# Patient Record
Sex: Female | Born: 1955 | Race: Black or African American | Hispanic: No | Smoking: Former smoker
Health system: Southern US, Community
[De-identification: ages and names within clinical notes are randomized; demographics above are authoritative.]

## PROBLEM LIST (undated history)

## (undated) DIAGNOSIS — E1140 Type 2 diabetes mellitus with diabetic neuropathy, unspecified: Secondary | ICD-10-CM

## (undated) DIAGNOSIS — I2510 Atherosclerotic heart disease of native coronary artery without angina pectoris: Secondary | ICD-10-CM

## (undated) DIAGNOSIS — Z720 Tobacco use: Secondary | ICD-10-CM

## (undated) DIAGNOSIS — I739 Peripheral vascular disease, unspecified: Secondary | ICD-10-CM

## (undated) DIAGNOSIS — E785 Hyperlipidemia, unspecified: Secondary | ICD-10-CM

## (undated) DIAGNOSIS — F32A Depression, unspecified: Secondary | ICD-10-CM

## (undated) DIAGNOSIS — G629 Polyneuropathy, unspecified: Secondary | ICD-10-CM

## (undated) DIAGNOSIS — E669 Obesity, unspecified: Secondary | ICD-10-CM

## (undated) DIAGNOSIS — Z794 Long term (current) use of insulin: Secondary | ICD-10-CM

## (undated) DIAGNOSIS — E119 Type 2 diabetes mellitus without complications: Secondary | ICD-10-CM

## (undated) DIAGNOSIS — IMO0001 Reserved for inherently not codable concepts without codable children: Secondary | ICD-10-CM

## (undated) DIAGNOSIS — I4891 Unspecified atrial fibrillation: Secondary | ICD-10-CM

## (undated) DIAGNOSIS — I4892 Unspecified atrial flutter: Secondary | ICD-10-CM

## (undated) DIAGNOSIS — M869 Osteomyelitis, unspecified: Secondary | ICD-10-CM

## (undated) DIAGNOSIS — M1710 Unilateral primary osteoarthritis, unspecified knee: Secondary | ICD-10-CM

## (undated) DIAGNOSIS — M179 Osteoarthritis of knee, unspecified: Secondary | ICD-10-CM

## (undated) DIAGNOSIS — F419 Anxiety disorder, unspecified: Secondary | ICD-10-CM

## (undated) DIAGNOSIS — M1990 Unspecified osteoarthritis, unspecified site: Secondary | ICD-10-CM

## (undated) DIAGNOSIS — I10 Essential (primary) hypertension: Secondary | ICD-10-CM

## (undated) DIAGNOSIS — K219 Gastro-esophageal reflux disease without esophagitis: Secondary | ICD-10-CM

## (undated) DIAGNOSIS — I255 Ischemic cardiomyopathy: Secondary | ICD-10-CM

## (undated) HISTORY — DX: Reserved for inherently not codable concepts without codable children: IMO0001

## (undated) HISTORY — DX: Ischemic cardiomyopathy: I25.5

## (undated) HISTORY — DX: Unspecified osteoarthritis, unspecified site: M19.90

## (undated) HISTORY — PX: BREAST BIOPSY: SHX20

## (undated) HISTORY — DX: Long term (current) use of insulin: Z79.4

## (undated) HISTORY — DX: Type 2 diabetes mellitus with diabetic neuropathy, unspecified: E11.40

## (undated) HISTORY — DX: Type 2 diabetes mellitus without complications: E11.9

## (undated) HISTORY — DX: Hyperlipidemia, unspecified: E78.5

## (undated) HISTORY — PX: AORTIC VALVE REPLACEMENT (AVR)/CORONARY ARTERY BYPASS GRAFTING (CABG): SHX5725

## (undated) HISTORY — DX: Peripheral vascular disease, unspecified: I73.9

## (undated) SURGERY — Surgical Case
Anesthesia: *Unknown

---

## 2004-08-21 ENCOUNTER — Emergency Department: Payer: Self-pay | Admitting: Internal Medicine

## 2004-10-05 ENCOUNTER — Emergency Department: Payer: Self-pay | Admitting: Emergency Medicine

## 2004-10-06 ENCOUNTER — Ambulatory Visit: Payer: Self-pay | Admitting: Emergency Medicine

## 2005-10-08 ENCOUNTER — Emergency Department: Payer: Self-pay | Admitting: Emergency Medicine

## 2006-11-15 ENCOUNTER — Emergency Department: Payer: Self-pay | Admitting: Emergency Medicine

## 2007-03-11 ENCOUNTER — Emergency Department: Payer: Self-pay | Admitting: Emergency Medicine

## 2009-08-06 ENCOUNTER — Emergency Department: Payer: Self-pay | Admitting: Emergency Medicine

## 2010-11-30 ENCOUNTER — Emergency Department: Payer: Self-pay | Admitting: Emergency Medicine

## 2011-12-31 HISTORY — PX: TOE AMPUTATION: SHX809

## 2012-01-19 ENCOUNTER — Inpatient Hospital Stay: Payer: Self-pay | Admitting: Internal Medicine

## 2012-01-19 LAB — COMPREHENSIVE METABOLIC PANEL
Albumin: 3.5 g/dL (ref 3.4–5.0)
Alkaline Phosphatase: 160 U/L — ABNORMAL HIGH (ref 50–136)
Anion Gap: 12 (ref 7–16)
BUN: 10 mg/dL (ref 7–18)
Bilirubin,Total: 0.2 mg/dL (ref 0.2–1.0)
Calcium, Total: 9.5 mg/dL (ref 8.5–10.1)
Chloride: 100 mmol/L (ref 98–107)
Co2: 24 mmol/L (ref 21–32)
Creatinine: 0.87 mg/dL (ref 0.60–1.30)
EGFR (African American): >60
EGFR (Non-African Amer.): >60
Glucose: 339 mg/dL — ABNORMAL HIGH (ref 65–99)
Osmolality: 284 (ref 275–301)
Potassium: 4.2 mmol/L (ref 3.5–5.1)
SGOT(AST): 11 U/L — ABNORMAL LOW (ref 15–37)
SGPT (ALT): 14 U/L (ref 12–78)
Sodium: 136 mmol/L (ref 136–145)
Total Protein: 8.3 g/dL — ABNORMAL HIGH (ref 6.4–8.2)

## 2012-01-19 LAB — CBC
HCT: 45.2 % (ref 35.0–47.0)
HGB: 14.7 g/dL (ref 12.0–16.0)
MCH: 24.8 pg — ABNORMAL LOW (ref 26.0–34.0)
MCHC: 32.4 g/dL (ref 32.0–36.0)
MCV: 77 fL — ABNORMAL LOW (ref 80–100)
Platelet: 307 10*3/uL (ref 150–440)
RBC: 5.91 10*6/uL — ABNORMAL HIGH (ref 3.80–5.20)
RDW: 15.1 % — ABNORMAL HIGH (ref 11.5–14.5)
WBC: 12 10*3/uL — ABNORMAL HIGH (ref 3.6–11.0)

## 2012-01-19 LAB — SEDIMENTATION RATE: Erythrocyte Sed Rate: 21 mm/hr (ref 0–30)

## 2012-01-19 LAB — HEMOGLOBIN A1C: Hemoglobin A1C: 13.5 % — ABNORMAL HIGH (ref 4.2–6.3)

## 2012-01-20 LAB — CBC WITH DIFFERENTIAL/PLATELET
Basophil #: 0.1 10*3/uL (ref 0.0–0.1)
Basophil %: 0.8 %
Eosinophil #: 0.5 10*3/uL (ref 0.0–0.7)
Eosinophil %: 5.7 %
HCT: 40.8 % (ref 35.0–47.0)
HGB: 13.2 g/dL (ref 12.0–16.0)
Lymphocyte #: 3.1 10*3/uL (ref 1.0–3.6)
Lymphocyte %: 36.1 %
MCH: 24.7 pg — ABNORMAL LOW (ref 26.0–34.0)
MCHC: 32.4 g/dL (ref 32.0–36.0)
MCV: 76 fL — ABNORMAL LOW (ref 80–100)
Monocyte #: 0.8 x10 3/mm (ref 0.2–0.9)
Monocyte %: 9.3 %
Neutrophil #: 4.1 10*3/uL (ref 1.4–6.5)
Neutrophil %: 48.1 %
Platelet: 333 10*3/uL (ref 150–440)
RBC: 5.35 10*6/uL — ABNORMAL HIGH (ref 3.80–5.20)
RDW: 15.2 % — ABNORMAL HIGH (ref 11.5–14.5)
WBC: 8.5 10*3/uL (ref 3.6–11.0)

## 2012-01-21 LAB — CBC WITH DIFFERENTIAL/PLATELET
Basophil #: 0.1 10*3/uL (ref 0.0–0.1)
Basophil %: 0.7 %
Eosinophil #: 0.5 10*3/uL (ref 0.0–0.7)
Eosinophil %: 5.6 %
HCT: 41.3 % (ref 35.0–47.0)
HGB: 13.4 g/dL (ref 12.0–16.0)
Lymphocyte #: 2.2 10*3/uL (ref 1.0–3.6)
Lymphocyte %: 26.3 %
MCH: 25 pg — ABNORMAL LOW (ref 26.0–34.0)
MCHC: 32.5 g/dL (ref 32.0–36.0)
MCV: 77 fL — ABNORMAL LOW (ref 80–100)
Monocyte #: 1 x10 3/mm — ABNORMAL HIGH (ref 0.2–0.9)
Monocyte %: 11.4 %
Neutrophil #: 4.7 10*3/uL (ref 1.4–6.5)
Neutrophil %: 56 %
Platelet: 329 10*3/uL (ref 150–440)
RBC: 5.38 10*6/uL — ABNORMAL HIGH (ref 3.80–5.20)
RDW: 15 % — ABNORMAL HIGH (ref 11.5–14.5)
WBC: 8.4 10*3/uL (ref 3.6–11.0)

## 2012-01-22 LAB — VANCOMYCIN, TROUGH: Vancomycin, Trough: 9 ug/mL — ABNORMAL LOW (ref 10–20)

## 2012-01-23 LAB — PATHOLOGY REPORT

## 2012-01-26 LAB — WOUND CULTURE

## 2012-02-02 ENCOUNTER — Inpatient Hospital Stay: Payer: Self-pay | Admitting: Podiatry

## 2012-02-02 LAB — CBC WITH DIFFERENTIAL/PLATELET
Basophil #: 0.1 10*3/uL (ref 0.0–0.1)
Basophil %: 1.2 %
Eosinophil #: 0.4 10*3/uL (ref 0.0–0.7)
Eosinophil %: 4.6 %
HCT: 41.9 % (ref 35.0–47.0)
HGB: 13.4 g/dL (ref 12.0–16.0)
Lymphocyte #: 2.9 10*3/uL (ref 1.0–3.6)
Lymphocyte %: 32.9 %
MCH: 24.5 pg — ABNORMAL LOW (ref 26.0–34.0)
MCHC: 32.1 g/dL (ref 32.0–36.0)
MCV: 76 fL — ABNORMAL LOW (ref 80–100)
Monocyte #: 0.8 x10 3/mm (ref 0.2–0.9)
Monocyte %: 9.2 %
Neutrophil #: 4.6 10*3/uL (ref 1.4–6.5)
Neutrophil %: 52.1 %
Platelet: 393 10*3/uL (ref 150–440)
RBC: 5.5 10*6/uL — ABNORMAL HIGH (ref 3.80–5.20)
RDW: 14.9 % — ABNORMAL HIGH (ref 11.5–14.5)
WBC: 8.7 10*3/uL (ref 3.6–11.0)

## 2012-02-02 LAB — BASIC METABOLIC PANEL
Anion Gap: 7 (ref 7–16)
BUN: 11 mg/dL (ref 7–18)
Calcium, Total: 9.1 mg/dL (ref 8.5–10.1)
Chloride: 106 mmol/L (ref 98–107)
Co2: 28 mmol/L (ref 21–32)
Creatinine: 1 mg/dL (ref 0.60–1.30)
EGFR (African American): >60
EGFR (Non-African Amer.): >60
Glucose: 166 mg/dL — ABNORMAL HIGH (ref 65–99)
Osmolality: 284 (ref 275–301)
Potassium: 4.3 mmol/L (ref 3.5–5.1)
Sodium: 141 mmol/L (ref 136–145)

## 2012-02-04 LAB — CBC WITH DIFFERENTIAL/PLATELET
Basophil #: 0.1 10*3/uL (ref 0.0–0.1)
Basophil %: 0.8 %
Eosinophil #: 0.4 10*3/uL (ref 0.0–0.7)
Eosinophil %: 5.6 %
HCT: 36.2 % (ref 35.0–47.0)
HGB: 12.4 g/dL (ref 12.0–16.0)
Lymphocyte #: 2.9 10*3/uL (ref 1.0–3.6)
Lymphocyte %: 37.2 %
MCH: 26.3 pg (ref 26.0–34.0)
MCHC: 34.3 g/dL (ref 32.0–36.0)
MCV: 77 fL — ABNORMAL LOW (ref 80–100)
Monocyte #: 0.8 x10 3/mm (ref 0.2–0.9)
Monocyte %: 10 %
Neutrophil #: 3.6 10*3/uL (ref 1.4–6.5)
Neutrophil %: 46.4 %
Platelet: 324 10*3/uL (ref 150–440)
RBC: 4.72 10*6/uL (ref 3.80–5.20)
RDW: 15.1 % — ABNORMAL HIGH (ref 11.5–14.5)
WBC: 7.8 10*3/uL (ref 3.6–11.0)

## 2012-02-07 LAB — WOUND CULTURE

## 2012-02-07 LAB — PATHOLOGY REPORT

## 2012-08-18 ENCOUNTER — Emergency Department: Payer: Self-pay | Admitting: Internal Medicine

## 2012-08-18 LAB — COMPREHENSIVE METABOLIC PANEL
Albumin: 3.4 g/dL (ref 3.4–5.0)
Alkaline Phosphatase: 146 U/L — ABNORMAL HIGH (ref 50–136)
Anion Gap: 3 — ABNORMAL LOW (ref 7–16)
BUN: 14 mg/dL (ref 7–18)
Bilirubin,Total: 0.2 mg/dL (ref 0.2–1.0)
Calcium, Total: 8.7 mg/dL (ref 8.5–10.1)
Chloride: 108 mmol/L — ABNORMAL HIGH (ref 98–107)
Co2: 30 mmol/L (ref 21–32)
Creatinine: 1.02 mg/dL (ref 0.60–1.30)
Glucose: 144 mg/dL — ABNORMAL HIGH (ref 65–99)
Osmolality: 284 (ref 275–301)
Potassium: 4 mmol/L (ref 3.5–5.1)
SGOT(AST): 15 U/L (ref 15–37)
SGPT (ALT): 18 U/L (ref 12–78)
Sodium: 141 mmol/L (ref 136–145)
Total Protein: 7.6 g/dL (ref 6.4–8.2)

## 2012-08-18 LAB — URINALYSIS, COMPLETE
Bacteria: NONE SEEN
Bilirubin,UR: NEGATIVE
Blood: NEGATIVE
Glucose,UR: 50 mg/dL (ref 0–75)
Ketone: NEGATIVE
Leukocyte Esterase: NEGATIVE
Nitrite: NEGATIVE
Ph: 5 (ref 4.5–8.0)
Protein: NEGATIVE
RBC,UR: <1 /HPF (ref 0–5)
Specific Gravity: 1.014 (ref 1.003–1.030)
Squamous Epithelial: 1
WBC UR: 1 /HPF (ref 0–5)

## 2012-08-18 LAB — CBC
HCT: 41.7 % (ref 35.0–47.0)
HGB: 13.3 g/dL (ref 12.0–16.0)
MCH: 24.8 pg — ABNORMAL LOW (ref 26.0–34.0)
MCHC: 32 g/dL (ref 32.0–36.0)
MCV: 78 fL — ABNORMAL LOW (ref 80–100)
Platelet: 326 10*3/uL (ref 150–440)
RBC: 5.37 10*6/uL — ABNORMAL HIGH (ref 3.80–5.20)
RDW: 15.4 % — ABNORMAL HIGH (ref 11.5–14.5)
WBC: 7.6 10*3/uL (ref 3.6–11.0)

## 2012-08-18 LAB — TROPONIN I: Troponin-I: <0.02 ng/mL

## 2012-09-11 DIAGNOSIS — E119 Type 2 diabetes mellitus without complications: Secondary | ICD-10-CM | POA: Insufficient documentation

## 2012-09-11 DIAGNOSIS — I10 Essential (primary) hypertension: Secondary | ICD-10-CM | POA: Diagnosis present

## 2012-12-03 ENCOUNTER — Ambulatory Visit: Payer: Self-pay

## 2013-11-28 ENCOUNTER — Emergency Department: Payer: Self-pay | Admitting: Emergency Medicine

## 2013-11-28 LAB — COMPREHENSIVE METABOLIC PANEL
Albumin: 2.9 g/dL — ABNORMAL LOW (ref 3.4–5.0)
Alkaline Phosphatase: 138 U/L — ABNORMAL HIGH
Anion Gap: 6 — ABNORMAL LOW (ref 7–16)
BUN: 12 mg/dL (ref 7–18)
Bilirubin,Total: 0.2 mg/dL (ref 0.2–1.0)
Calcium, Total: 8.7 mg/dL (ref 8.5–10.1)
Chloride: 108 mmol/L — ABNORMAL HIGH (ref 98–107)
Co2: 28 mmol/L (ref 21–32)
Creatinine: 1.11 mg/dL (ref 0.60–1.30)
EGFR (African American): >60
EGFR (Non-African Amer.): 54 — ABNORMAL LOW
Glucose: 324 mg/dL — ABNORMAL HIGH (ref 65–99)
Osmolality: 295 (ref 275–301)
Potassium: 4.2 mmol/L (ref 3.5–5.1)
SGOT(AST): 12 U/L — ABNORMAL LOW (ref 15–37)
SGPT (ALT): 19 U/L
Sodium: 142 mmol/L (ref 136–145)
Total Protein: 7.1 g/dL (ref 6.4–8.2)

## 2013-11-28 LAB — CBC WITH DIFFERENTIAL/PLATELET
Basophil #: 0.1 10*3/uL (ref 0.0–0.1)
Basophil %: 0.7 %
Eosinophil #: 0.4 10*3/uL (ref 0.0–0.7)
Eosinophil %: 4.8 %
HCT: 39.7 % (ref 35.0–47.0)
HGB: 12.7 g/dL (ref 12.0–16.0)
Lymphocyte #: 2.5 10*3/uL (ref 1.0–3.6)
Lymphocyte %: 29.2 %
MCH: 24.8 pg — ABNORMAL LOW (ref 26.0–34.0)
MCHC: 32.1 g/dL (ref 32.0–36.0)
MCV: 77 fL — ABNORMAL LOW (ref 80–100)
Monocyte #: 0.9 x10 3/mm (ref 0.2–0.9)
Monocyte %: 10.5 %
Neutrophil #: 4.8 10*3/uL (ref 1.4–6.5)
Neutrophil %: 54.8 %
Platelet: 278 10*3/uL (ref 150–440)
RBC: 5.14 10*6/uL (ref 3.80–5.20)
RDW: 16.3 % — ABNORMAL HIGH (ref 11.5–14.5)
WBC: 8.7 10*3/uL (ref 3.6–11.0)

## 2014-05-01 DIAGNOSIS — I219 Acute myocardial infarction, unspecified: Secondary | ICD-10-CM

## 2014-05-01 HISTORY — DX: Acute myocardial infarction, unspecified: I21.9

## 2014-06-01 HISTORY — PX: CARDIAC CATHETERIZATION: SHX172

## 2014-06-01 HISTORY — PX: US ECHOCARDIOGRAPHY: HXRAD669

## 2014-06-01 HISTORY — PX: CT ABD W & PELVIS WO CM: HXRAD294

## 2014-06-01 LAB — COMPREHENSIVE METABOLIC PANEL
Albumin: 3.2 g/dL — ABNORMAL LOW (ref 3.4–5.0)
Alkaline Phosphatase: 103 U/L (ref 46–116)
Anion Gap: 8 (ref 7–16)
BUN: 17 mg/dL (ref 7–18)
Bilirubin,Total: 0.4 mg/dL (ref 0.2–1.0)
Calcium, Total: 9.1 mg/dL (ref 8.5–10.1)
Chloride: 102 mmol/L (ref 98–107)
Co2: 27 mmol/L (ref 21–32)
Creatinine: 1.42 mg/dL — ABNORMAL HIGH (ref 0.60–1.30)
EGFR (African American): 49 — ABNORMAL LOW
EGFR (Non-African Amer.): 40 — ABNORMAL LOW
Glucose: 205 mg/dL — ABNORMAL HIGH (ref 65–99)
Osmolality: 281 (ref 275–301)
Potassium: 3.7 mmol/L (ref 3.5–5.1)
SGOT(AST): 14 U/L — ABNORMAL LOW (ref 15–37)
SGPT (ALT): 14 U/L (ref 14–63)
Sodium: 137 mmol/L (ref 136–145)
Total Protein: 7.1 g/dL (ref 6.4–8.2)

## 2014-06-01 LAB — CBC WITH DIFFERENTIAL/PLATELET
Basophil #: 0 10*3/uL (ref 0.0–0.1)
Basophil %: 0.2 %
Eosinophil #: 0.2 10*3/uL (ref 0.0–0.7)
Eosinophil %: 1.4 %
HCT: 41.1 % (ref 35.0–47.0)
HGB: 12.8 g/dL (ref 12.0–16.0)
Lymphocyte #: 1.1 10*3/uL (ref 1.0–3.6)
Lymphocyte %: 7.5 %
MCH: 24.1 pg — ABNORMAL LOW (ref 26.0–34.0)
MCHC: 31.1 g/dL — ABNORMAL LOW (ref 32.0–36.0)
MCV: 78 fL — ABNORMAL LOW (ref 80–100)
Monocyte #: 0.1 x10 3/mm — ABNORMAL LOW (ref 0.2–0.9)
Monocyte %: 0.9 %
Neutrophil #: 13.2 10*3/uL — ABNORMAL HIGH (ref 1.4–6.5)
Neutrophil %: 90 %
Platelet: 317 10*3/uL (ref 150–440)
RBC: 5.29 10*6/uL — ABNORMAL HIGH (ref 3.80–5.20)
RDW: 15.3 % — ABNORMAL HIGH (ref 11.5–14.5)
WBC: 14.7 10*3/uL — ABNORMAL HIGH (ref 3.6–11.0)

## 2014-06-01 LAB — URINALYSIS, COMPLETE
Bacteria: NONE SEEN
Bilirubin,UR: NEGATIVE
Glucose,UR: 50 mg/dL (ref 0–75)
Ketone: NEGATIVE
Leukocyte Esterase: NEGATIVE
Nitrite: NEGATIVE
Ph: 5 (ref 4.5–8.0)
Protein: 30
RBC,UR: 2 /HPF (ref 0–5)
Specific Gravity: 1.031 (ref 1.003–1.030)
Squamous Epithelial: 4
WBC UR: 7 /HPF (ref 0–5)

## 2014-06-01 LAB — LIPASE, BLOOD: Lipase: 70 U/L — ABNORMAL LOW (ref 73–393)

## 2014-06-02 ENCOUNTER — Other Ambulatory Visit: Payer: Self-pay | Admitting: Physician Assistant

## 2014-06-02 ENCOUNTER — Inpatient Hospital Stay: Payer: Self-pay | Admitting: Internal Medicine

## 2014-06-02 DIAGNOSIS — I340 Nonrheumatic mitral (valve) insufficiency: Secondary | ICD-10-CM | POA: Diagnosis not present

## 2014-06-02 DIAGNOSIS — I214 Non-ST elevation (NSTEMI) myocardial infarction: Secondary | ICD-10-CM

## 2014-06-02 DIAGNOSIS — R109 Unspecified abdominal pain: Secondary | ICD-10-CM

## 2014-06-02 DIAGNOSIS — R7989 Other specified abnormal findings of blood chemistry: Secondary | ICD-10-CM | POA: Diagnosis not present

## 2014-06-02 LAB — TROPONIN I
Troponin-I: 1.3 ng/mL — ABNORMAL HIGH
Troponin-I: 13.26 ng/mL — ABNORMAL HIGH
Troponin-I: 23 ng/mL — ABNORMAL HIGH
Troponin-I: 3.6 ng/mL — ABNORMAL HIGH
Troponin-I: 7.8 ng/mL — ABNORMAL HIGH
Troponin-I: <0.02 ng/mL

## 2014-06-02 LAB — CBC WITH DIFFERENTIAL/PLATELET
Basophil #: 0 10*3/uL (ref 0.0–0.1)
Basophil %: 0.2 %
Eosinophil #: 0 10*3/uL (ref 0.0–0.7)
Eosinophil %: 0.2 %
HCT: 35.4 % (ref 35.0–47.0)
HGB: 11.2 g/dL — ABNORMAL LOW (ref 12.0–16.0)
Lymphocyte #: 0.9 10*3/uL — ABNORMAL LOW (ref 1.0–3.6)
Lymphocyte %: 4.4 %
MCH: 24.5 pg — ABNORMAL LOW (ref 26.0–34.0)
MCHC: 31.6 g/dL — ABNORMAL LOW (ref 32.0–36.0)
MCV: 78 fL — ABNORMAL LOW (ref 80–100)
Monocyte #: 0.8 x10 3/mm (ref 0.2–0.9)
Monocyte %: 3.8 %
Neutrophil #: 18.1 10*3/uL — ABNORMAL HIGH (ref 1.4–6.5)
Neutrophil %: 91.4 %
Platelet: 267 10*3/uL (ref 150–440)
RBC: 4.56 10*6/uL (ref 3.80–5.20)
RDW: 15.3 % — ABNORMAL HIGH (ref 11.5–14.5)
WBC: 19.8 10*3/uL — ABNORMAL HIGH (ref 3.6–11.0)

## 2014-06-02 LAB — BASIC METABOLIC PANEL
Anion Gap: 8 (ref 7–16)
BUN: 19 mg/dL — ABNORMAL HIGH (ref 7–18)
Calcium, Total: 8.2 mg/dL — ABNORMAL LOW (ref 8.5–10.1)
Chloride: 107 mmol/L (ref 98–107)
Co2: 24 mmol/L (ref 21–32)
Creatinine: 1.53 mg/dL — ABNORMAL HIGH (ref 0.60–1.30)
EGFR (African American): 45 — ABNORMAL LOW
EGFR (Non-African Amer.): 37 — ABNORMAL LOW
Glucose: 212 mg/dL — ABNORMAL HIGH (ref 65–99)
Osmolality: 286 (ref 275–301)
Potassium: 3.9 mmol/L (ref 3.5–5.1)
Sodium: 139 mmol/L (ref 136–145)

## 2014-06-02 LAB — LIPID PANEL
Cholesterol: 94 mg/dL (ref 0–200)
HDL Cholesterol: 39 mg/dL — ABNORMAL LOW (ref 40–60)
Ldl Cholesterol, Calc: 31 mg/dL (ref 0–100)
Triglycerides: 122 mg/dL (ref 0–200)
VLDL Cholesterol, Calc: 24 mg/dL (ref 5–40)

## 2014-06-02 LAB — HEPARIN LEVEL (UNFRACTIONATED)
Anti-Xa(Unfractionated): 0.18 IU/mL — ABNORMAL LOW (ref 0.30–0.70)
Anti-Xa(Unfractionated): 0.25 IU/mL — ABNORMAL LOW (ref 0.30–0.70)

## 2014-06-02 LAB — APTT: Activated PTT: 25.6 secs (ref 23.6–35.9)

## 2014-06-02 LAB — HEMOGLOBIN A1C: Hemoglobin A1C: 11.5 % — ABNORMAL HIGH (ref 4.2–6.3)

## 2014-06-02 LAB — CK-MB
CK-MB: 10.7 ng/mL — ABNORMAL HIGH (ref 0.5–3.6)
CK-MB: 0.5 ng/mL (ref 0.5–3.6)
CK-MB: 20.5 ng/mL — ABNORMAL HIGH (ref 0.5–3.6)

## 2014-06-02 LAB — PROTIME-INR
INR: 1.1
Prothrombin Time: 14 secs

## 2014-06-03 ENCOUNTER — Inpatient Hospital Stay (HOSPITAL_COMMUNITY)
Admission: AD | Admit: 2014-06-03 | Discharge: 2014-06-13 | DRG: 236 | Disposition: A | Discharge status: Home / Self Care | Payer: Medicaid Other | Source: Other Acute Inpatient Hospital | Attending: Cardiothoracic Surgery | Admitting: Cardiothoracic Surgery

## 2014-06-03 ENCOUNTER — Encounter (HOSPITAL_COMMUNITY): Payer: Self-pay | Admitting: Physician Assistant

## 2014-06-03 DIAGNOSIS — E1129 Type 2 diabetes mellitus with other diabetic kidney complication: Secondary | ICD-10-CM

## 2014-06-03 DIAGNOSIS — D62 Acute posthemorrhagic anemia: Secondary | ICD-10-CM | POA: Diagnosis not present

## 2014-06-03 DIAGNOSIS — I214 Non-ST elevation (NSTEMI) myocardial infarction: Principal | ICD-10-CM | POA: Diagnosis present

## 2014-06-03 DIAGNOSIS — Z791 Long term (current) use of non-steroidal anti-inflammatories (NSAID): Secondary | ICD-10-CM

## 2014-06-03 DIAGNOSIS — D638 Anemia in other chronic diseases classified elsewhere: Secondary | ICD-10-CM | POA: Diagnosis present

## 2014-06-03 DIAGNOSIS — Z951 Presence of aortocoronary bypass graft: Secondary | ICD-10-CM

## 2014-06-03 DIAGNOSIS — F17210 Nicotine dependence, cigarettes, uncomplicated: Secondary | ICD-10-CM | POA: Diagnosis present

## 2014-06-03 DIAGNOSIS — Z794 Long term (current) use of insulin: Secondary | ICD-10-CM | POA: Diagnosis not present

## 2014-06-03 DIAGNOSIS — Z79899 Other long term (current) drug therapy: Secondary | ICD-10-CM | POA: Diagnosis not present

## 2014-06-03 DIAGNOSIS — F419 Anxiety disorder, unspecified: Secondary | ICD-10-CM | POA: Diagnosis present

## 2014-06-03 DIAGNOSIS — K5730 Diverticulosis of large intestine without perforation or abscess without bleeding: Secondary | ICD-10-CM | POA: Diagnosis present

## 2014-06-03 DIAGNOSIS — IMO0002 Reserved for concepts with insufficient information to code with codable children: Secondary | ICD-10-CM

## 2014-06-03 DIAGNOSIS — E118 Type 2 diabetes mellitus with unspecified complications: Secondary | ICD-10-CM | POA: Diagnosis not present

## 2014-06-03 DIAGNOSIS — K219 Gastro-esophageal reflux disease without esophagitis: Secondary | ICD-10-CM | POA: Diagnosis present

## 2014-06-03 DIAGNOSIS — E1165 Type 2 diabetes mellitus with hyperglycemia: Secondary | ICD-10-CM | POA: Diagnosis present

## 2014-06-03 DIAGNOSIS — Z6838 Body mass index (BMI) 38.0-38.9, adult: Secondary | ICD-10-CM | POA: Diagnosis not present

## 2014-06-03 DIAGNOSIS — G629 Polyneuropathy, unspecified: Secondary | ICD-10-CM | POA: Diagnosis present

## 2014-06-03 DIAGNOSIS — E8770 Fluid overload, unspecified: Secondary | ICD-10-CM | POA: Diagnosis not present

## 2014-06-03 DIAGNOSIS — E1322 Other specified diabetes mellitus with diabetic chronic kidney disease: Secondary | ICD-10-CM

## 2014-06-03 DIAGNOSIS — Z01810 Encounter for preprocedural cardiovascular examination: Secondary | ICD-10-CM | POA: Diagnosis not present

## 2014-06-03 DIAGNOSIS — I2510 Atherosclerotic heart disease of native coronary artery without angina pectoris: Secondary | ICD-10-CM | POA: Diagnosis not present

## 2014-06-03 DIAGNOSIS — I219 Acute myocardial infarction, unspecified: Secondary | ICD-10-CM

## 2014-06-03 DIAGNOSIS — Z8249 Family history of ischemic heart disease and other diseases of the circulatory system: Secondary | ICD-10-CM

## 2014-06-03 DIAGNOSIS — I517 Cardiomegaly: Secondary | ICD-10-CM | POA: Diagnosis not present

## 2014-06-03 DIAGNOSIS — E669 Obesity, unspecified: Secondary | ICD-10-CM | POA: Diagnosis present

## 2014-06-03 DIAGNOSIS — I25110 Atherosclerotic heart disease of native coronary artery with unstable angina pectoris: Secondary | ICD-10-CM | POA: Diagnosis not present

## 2014-06-03 DIAGNOSIS — J329 Chronic sinusitis, unspecified: Secondary | ICD-10-CM | POA: Diagnosis present

## 2014-06-03 DIAGNOSIS — I10 Essential (primary) hypertension: Secondary | ICD-10-CM | POA: Diagnosis present

## 2014-06-03 DIAGNOSIS — E119 Type 2 diabetes mellitus without complications: Secondary | ICD-10-CM | POA: Diagnosis not present

## 2014-06-03 DIAGNOSIS — K5900 Constipation, unspecified: Secondary | ICD-10-CM | POA: Diagnosis present

## 2014-06-03 DIAGNOSIS — I739 Peripheral vascular disease, unspecified: Secondary | ICD-10-CM | POA: Diagnosis present

## 2014-06-03 HISTORY — DX: Atherosclerotic heart disease of native coronary artery without angina pectoris: I25.10

## 2014-06-03 HISTORY — DX: Unilateral primary osteoarthritis, unspecified knee: M17.10

## 2014-06-03 HISTORY — DX: Essential (primary) hypertension: I10

## 2014-06-03 HISTORY — DX: Obesity, unspecified: E66.9

## 2014-06-03 HISTORY — DX: Anxiety disorder, unspecified: F41.9

## 2014-06-03 HISTORY — DX: Gastro-esophageal reflux disease without esophagitis: K21.9

## 2014-06-03 HISTORY — DX: Osteoarthritis of knee, unspecified: M17.9

## 2014-06-03 HISTORY — DX: Polyneuropathy, unspecified: G62.9

## 2014-06-03 HISTORY — DX: Tobacco use: Z72.0

## 2014-06-03 LAB — BASIC METABOLIC PANEL
Anion Gap: 7 (ref 7–16)
BUN: 13 mg/dL (ref 7–18)
Calcium, Total: 8.1 mg/dL — ABNORMAL LOW (ref 8.5–10.1)
Chloride: 107 mmol/L (ref 98–107)
Co2: 26 mmol/L (ref 21–32)
Creatinine: 1.13 mg/dL (ref 0.60–1.30)
EGFR (African American): >60
EGFR (Non-African Amer.): 52 — ABNORMAL LOW
Glucose: 227 mg/dL — ABNORMAL HIGH (ref 65–99)
Osmolality: 287 (ref 275–301)
Potassium: 4.1 mmol/L (ref 3.5–5.1)
Sodium: 140 mmol/L (ref 136–145)

## 2014-06-03 LAB — TROPONIN I
Troponin-I: 30 ng/mL — ABNORMAL HIGH
Troponin-I: 33 ng/mL — ABNORMAL HIGH

## 2014-06-03 LAB — HEPARIN LEVEL (UNFRACTIONATED): Anti-Xa(Unfractionated): 0.4 IU/mL (ref 0.30–0.70)

## 2014-06-03 LAB — CBC WITH DIFFERENTIAL/PLATELET
Basophil #: 0 10*3/uL (ref 0.0–0.1)
Basophil %: 0.3 %
Eosinophil #: 0.5 10*3/uL (ref 0.0–0.7)
Eosinophil %: 4.5 %
HCT: 36.1 % (ref 35.0–47.0)
HGB: 11.7 g/dL — ABNORMAL LOW (ref 12.0–16.0)
Lymphocyte #: 1.5 10*3/uL (ref 1.0–3.6)
Lymphocyte %: 13.9 %
MCH: 25 pg — ABNORMAL LOW (ref 26.0–34.0)
MCHC: 32.5 g/dL (ref 32.0–36.0)
MCV: 77 fL — ABNORMAL LOW (ref 80–100)
Monocyte #: 1 x10 3/mm — ABNORMAL HIGH (ref 0.2–0.9)
Monocyte %: 9.7 %
Neutrophil #: 7.5 10*3/uL — ABNORMAL HIGH (ref 1.4–6.5)
Neutrophil %: 71.6 %
Platelet: 247 10*3/uL (ref 150–440)
RBC: 4.7 10*6/uL (ref 3.80–5.20)
RDW: 15.4 % — ABNORMAL HIGH (ref 11.5–14.5)
WBC: 10.4 10*3/uL (ref 3.6–11.0)

## 2014-06-03 LAB — PROTIME-INR
INR: 1.1
Prothrombin Time: 14.4 secs

## 2014-06-03 LAB — URINE CULTURE

## 2014-06-03 MED ORDER — GABAPENTIN 300 MG PO CAPS
600.0000 mg | ORAL_CAPSULE | Freq: Two times a day (BID) | ORAL | Status: DC
  Administered 2014-06-03 – 2014-06-13 (×19): 600 mg via ORAL
  Filled 2014-06-03 (×22): qty 2

## 2014-06-03 MED ORDER — OXYCODONE-ACETAMINOPHEN 5-325 MG PO TABS
1.0000 | ORAL_TABLET | Freq: Three times a day (TID) | ORAL | Status: DC | PRN
  Administered 2014-06-03: 1 via ORAL
  Filled 2014-06-03: qty 1

## 2014-06-03 MED ORDER — ACETAMINOPHEN 325 MG PO TABS: 650.0000 mg | ORAL_TABLET | ORAL | Status: DC | PRN

## 2014-06-03 MED ORDER — CITALOPRAM HYDROBROMIDE 40 MG PO TABS
40.0000 mg | ORAL_TABLET | Freq: Every day | ORAL | Status: DC
  Administered 2014-06-04 – 2014-06-13 (×9): 40 mg via ORAL
  Filled 2014-06-03 (×10): qty 1

## 2014-06-03 MED ORDER — NITROGLYCERIN 0.4 MG SL SUBL: 0.4000 mg | SUBLINGUAL_TABLET | SUBLINGUAL | Status: DC | PRN

## 2014-06-03 MED ORDER — OXYCODONE-ACETAMINOPHEN 5-325 MG PO TABS: 1.0000 | ORAL_TABLET | Freq: Four times a day (QID) | ORAL | Status: DC | PRN

## 2014-06-03 MED ORDER — CARVEDILOL 6.25 MG PO TABS
6.2500 mg | ORAL_TABLET | Freq: Two times a day (BID) | ORAL | Status: DC
  Administered 2014-06-03 – 2014-06-07 (×9): 6.25 mg via ORAL
  Filled 2014-06-03 (×12): qty 1

## 2014-06-03 MED ORDER — AMOXICILLIN-POT CLAVULANATE 875-125 MG PO TABS
1.0000 | ORAL_TABLET | Freq: Two times a day (BID) | ORAL | Status: DC
  Administered 2014-06-03 – 2014-06-07 (×9): 1 via ORAL
  Filled 2014-06-03 (×11): qty 1

## 2014-06-03 MED ORDER — ASPIRIN EC 81 MG PO TBEC
81.0000 mg | DELAYED_RELEASE_TABLET | Freq: Every day | ORAL | Status: DC
  Administered 2014-06-04 – 2014-06-07 (×4): 81 mg via ORAL
  Filled 2014-06-03 (×5): qty 1

## 2014-06-03 MED ORDER — PANTOPRAZOLE SODIUM 40 MG PO TBEC
40.0000 mg | DELAYED_RELEASE_TABLET | Freq: Every day | ORAL | Status: DC
  Administered 2014-06-03 – 2014-06-04 (×2): 40 mg via ORAL
  Filled 2014-06-03: qty 1

## 2014-06-03 MED ORDER — INSULIN GLARGINE 100 UNIT/ML ~~LOC~~ SOLN
35.0000 [IU] | Freq: Two times a day (BID) | SUBCUTANEOUS | Status: DC
  Administered 2014-06-03 – 2014-06-07 (×9): 35 [IU] via SUBCUTANEOUS
  Filled 2014-06-03 (×10): qty 0.35

## 2014-06-03 MED ORDER — ATORVASTATIN CALCIUM 80 MG PO TABS
80.0000 mg | ORAL_TABLET | Freq: Every day | ORAL | Status: DC
  Administered 2014-06-03 – 2014-06-12 (×9): 80 mg via ORAL
  Filled 2014-06-03 (×11): qty 1

## 2014-06-03 MED ORDER — HEPARIN (PORCINE) IN NACL 100-0.45 UNIT/ML-% IJ SOLN
1700.0000 [IU]/h | INTRAMUSCULAR | Status: DC
  Administered 2014-06-03: 1400 [IU]/h via INTRAVENOUS
  Administered 2014-06-04: 1700 [IU]/h via INTRAVENOUS
  Filled 2014-06-03 (×2): qty 250

## 2014-06-03 MED ORDER — OXYCODONE-ACETAMINOPHEN 5-325 MG PO TABS
1.0000 | ORAL_TABLET | Freq: Once | ORAL | Status: AC
  Administered 2014-06-03: 1 via ORAL
  Filled 2014-06-03: qty 1

## 2014-06-03 MED ORDER — ONDANSETRON HCL 4 MG/2ML IJ SOLN: 4.0000 mg | Freq: Four times a day (QID) | INTRAMUSCULAR | Status: DC | PRN

## 2014-06-03 MED ORDER — INSULIN ASPART 100 UNIT/ML ~~LOC~~ SOLN
0.0000 [IU] | Freq: Three times a day (TID) | SUBCUTANEOUS | Status: DC
  Administered 2014-06-04: 4 [IU] via SUBCUTANEOUS
  Administered 2014-06-04 – 2014-06-07 (×3): 2 [IU] via SUBCUTANEOUS
  Administered 2014-06-07: 4 [IU] via SUBCUTANEOUS

## 2014-06-03 MED ORDER — NICOTINE 21 MG/24HR TD PT24
21.0000 mg | MEDICATED_PATCH | Freq: Every day | TRANSDERMAL | Status: DC
  Administered 2014-06-05 – 2014-06-13 (×8): 21 mg via TRANSDERMAL
  Filled 2014-06-03 (×11): qty 1

## 2014-06-03 MED ORDER — OXYCODONE-ACETAMINOPHEN 5-325 MG PO TABS
1.0000 | ORAL_TABLET | Freq: Four times a day (QID) | ORAL | Status: DC | PRN
  Administered 2014-06-04 – 2014-06-06 (×5): 2 via ORAL
  Filled 2014-06-03 (×5): qty 2

## 2014-06-03 NOTE — Progress Notes (Signed)
ANTICOAGULATION CONSULT NOTE - Initial Consult  Pharmacy Consult for Heparin Indication: 3V CAD (awaiting TCTS evaluation)  Not on File  Patient Measurements: Height: 5' 8.5" (174 cm) Weight: 245 lb (111.131 kg) IBW/kg (Calculated) : 65.05 Heparin Dosing Weight: 90 kg  Vital Signs: Temp: 98.3 F (36.8 C) (02/03 1415) Temp Source: Oral (02/03 1415) BP: 106/58 mmHg (02/03 1415) Pulse Rate: 83 (02/03 1415)  Labs: No results for input(s): HGB, HCT, PLT, APTT, LABPROT, INR, HEPARINUNFRC, CREATININE, CKTOTAL, CKMB, TROPONINI in the last 72 hours.  CrCl cannot be calculated (Patient has no serum creatinine result on file.).   Medical History: Past Medical History  Diagnosis Date  . DM2 (diabetes mellitus, type 2)   . HTN (hypertension)   . Tobacco abuse   . Anxiety   . Peripheral neuropathy   . Obesity     Medications:  See electronic med rec  Assessment: 59 y.o. female presented to [facility] 2/2 with CP. Found to have NSTEMI. Heparin started 2/2 p.m. and d/c on call to cath this a.m. 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  Goal of Therapy:  Heparin level 0.3-0.7 units/ml Monitor platelets by anticoagulation protocol: Yes   Plan:  Begin heparin at 1400 units/hr. No bolus. Will f/u 6 hr heparin level Daily heparin level and CBC F/u TCTS consult  Sherlon Handing, PharmD, BCPS Clinical pharmacist, pager [phone] 06/03/2014,5:45 PM

## 2014-06-03 NOTE — H&P (Signed)
Cardiology Consultation Note  Patient ID: Melinda [name], MRN: [id number], DOB/AGE: 61/19/1957 61 y.o. Admit date: 06/03/2014   Date of Consult: 06/03/2014 Primary Physician: No primary care provider on file. Primary Cardiologist: New to [facility]  Chief Complaint: Sinus pressure and abdominal pain Reason for Consult: NSTEMI  HPI: 59 y.o. female with h/o DM2, HTN, ongoing tobacco abuse, anxiety, and neuropathy who presented to [facility] with symptoms of "not feeling well." Symptoms included left nasal bridge pain, diffuse abdominal pain, and one episode of chest pain. She was found to have sinusitis and an elevated troponin (initially <0.02, subsequently trended to >30 overnight).   Patient denies prior cardiac history. No prior stress tests or cardiac caths. She has not felt well since December 2015. At that time her metformin was increased from 500 mg bid to 1000 mg bid. Since then she has dealt with increased abdominal cramping, diarrhea, and fatigue. Her abdominal discomfort is typically epigastric, but can also be lower abdomen. She also notes some significant reflux. She has continued on with her daily routine as best she can. She was sitting on her porch  waiting for her ride to go to work on 2/1 when the left side of her nasal bridge began to hurt significantly along with her abdomen. She also briefly experienced some chest discomfort. She called for EMS evaluation. Upon their arrival it was felt that she had a sinus infection and that she should come to [facility] for further evaluation.   Upon her arrival to [facility] ED BP was found to be 85/51. Her blood pressure while EMS was on scene was 90s/60s. She denied any further chest pain on 2/1 or 2/2. She continued to complain of left nasal bridge pain and abdominal pain. Head CT showed sinus infection and she was started on Augmentin. CT abdomen showed stable diverticulosis. No findings to explain her symptoms. Her troponin <0.02-->1.30-->3.60 at the time of her  consult. We continued to trend her troponin level which showed 3.60-->7.80-->13.26-->23.00-->30.00. SCr 1.42-->1.53-->1.13, WBC count 14.7-->19.7-->10.4. She was started on heparin gtt and IV fluids. BP has improved to 120s/80s.   She was scheduled for cardiac cath which showed 95% stenosis mLAD, occlusion ostial OM1, 70% stenosis LCx, 95% stenosis mRCA, EF 45%. She was transferred to [facility] for evaluation of possible CABG. She was started on BB, statin, aspirin, advised to stop smoking.   Past Medical History  Diagnosis Date  . DM2 (diabetes mellitus, type 2)   . HTN (hypertension)   . Tobacco abuse   . Anxiety   . Peripheral neuropathy   . Obesity       Most Recent Cardiac Studies: See above and patients packet   Surgical History: No past surgical history on file.   Home Meds: Prior to Admission medications   Medication Sig Start Date End Date Taking? Authorizing Provider  acetaminophen (TYLENOL) 500 MG tablet Take 1,000 mg by mouth daily as needed for moderate pain (takes along with naprosyn).  05/04/14 05/04/15 Yes Historical Provider, MD  citalopram (CELEXA) 20 MG tablet Take 20 mg by mouth daily. 02/02/14 02/02/15 Yes Historical Provider, MD  gabapentin (NEURONTIN) 600 MG tablet Take 600 mg by mouth 2 (two) times daily.   Yes Historical Provider, MD  hydrochlorothiazide (HYDRODIURIL) 25 MG tablet Take 25 mg by mouth daily. 05/04/14 05/04/15 Yes Historical Provider, MD  Insulin Glargine (LANTUS) 100 UNIT/ML Solostar Pen Inject 35 Units into the skin 2 (two) times daily. 05/04/14 05/04/15 Yes Historical Provider, MD  metFORMIN (GLUCOPHAGE) 500  MG tablet Take 1,000 mg by mouth 2 (two) times daily. 05/04/14  Yes Historical Provider, MD  naproxen (NAPROSYN) 500 MG tablet Take 500 mg by mouth 2 (two) times daily. Takes along with tylenol 05/04/14 05/04/15 Yes Historical Provider, MD  pravastatin (PRAVACHOL) 40 MG tablet Take 40 mg by mouth every morning.   Yes Historical Provider, MD    Inpatient  Medications:  . amoxicillin-clavulanate  1 tablet Oral Q12H  . [START ON 06/04/2014] aspirin EC  81 mg Oral Daily  . atorvastatin  80 mg Oral q1800  . carvedilol  6.25 mg Oral BID WC  . [START ON 06/04/2014] citalopram  40 mg Oral Daily  . gabapentin  600 mg Oral BID  . [START ON 06/04/2014] insulin aspart  0-10 Units Subcutaneous TID WC  . insulin glargine  35 Units Subcutaneous BID  . nicotine  21 mg Transdermal Daily  . pantoprazole  40 mg Oral Daily      Allergies: Not on File  History   Social History  . Marital Status: Single    Spouse Name: N/A    Number of Children: N/A  . Years of Education: N/A   Occupational History  . Not on file.   Social History Main Topics  . Smoking status: Current Every Day Smoker  . Smokeless tobacco: Not on file  . Alcohol Use: No  . Drug Use: No  . Sexual Activity: Not on file   Other Topics Concern  . Not on file   Social History Narrative  . No narrative on file     Family History  Problem Relation Age of Onset  . CAD Mother   . Hypertension Mother   . CAD Father      Review of Systems: General: positive for fatigue. negative for chills, fever, night sweats or weight changes.  Cardiovascular: positive for chest pain. negative for edema, orthopnea, palpitations, paroxysmal nocturnal dyspnea, shortness of breath or dyspnea on exertion Dermatological: negative for rash Respiratory: negative for cough or wheezing Urologic: negative for hematuria Abdominal: positive for abdominal pain and diarrhea. negative for nausea, vomiting, bright red blood per rectum, melena, or hematemesis Neurologic: negative for visual changes, syncope, or dizziness All other systems reviewed and are otherwise negative except as noted above.  Labs: See packet provided by patient  Radiology/Studies:  No results found.  EKG: EKG shows NSR, 79, 2nd degree AVB Mobitz type I, left axis deviation, nonspecific inferior st/t changes  Physical Exam: Blood  pressure 106/58, pulse 83, temperature 98.3 F (36.8 C), temperature source Oral, resp. rate 18, SpO2 96 %. General: Well developed, well nourished, in no acute distress. Head: Normocephalic, atraumatic, sclera non-icteric, no xanthomas, nares are without discharge.  Neck: Negative for carotid bruits. JVD not elevated. Lungs: Clear bilaterally to auscultation without wheezes, rales, or rhonchi. Breathing is unlabored. Heart: RRR with S1 S2. No murmurs, rubs, or gallops appreciated. Abdomen: Soft, non-tender, non-distended with normoactive bowel sounds. No hepatomegaly. No rebound/guarding. No obvious abdominal masses. Msk:  Strength and tone appear normal for age. Extremities: No clubbing or cyanosis. No edema.  Distal pedal pulses are 2+ and equal bilaterally. Neuro: Alert and oriented X 3. No facial asymmetry. No focal deficit. Moves all extremities spontaneously. Psych:  Responds to questions appropriately with a normal affect.    Assessment and Plan:  59 year old female with history of DM2, HTN, ongoing tobacco abuse, anxiety, and neuropathy who presented to [facility] with symptoms of "not feeling well." Symptoms included left nasal  bridge pain and abdominal pain. She was found to have sinusitis and an elevated troponin.  1. NSTEMI: -Troponin has trended to 30.00 -Long history of smoking, diabetes, strong family history of coronary artery disease. High risk of underlying atherosclerosis. strongly recommended cardiac catheterization given troponin of 30.00.  -Cardiac cath showed severe 3 vessel coronary disease as above (see cath report in patient's packet that came along with her in the transfer for detailed report) -Start Coreg 6.25 mg bid, Lipitor 80 mg (primary prevention)  -Continue aspirin -Total cholesterol 94, LDL 31     The patient is now transferred to [facility] for consideration of bypass surgery.  2. Abdominal pain: -CT scan with stable diverticulosis, no explanation of  her symptoms -Improved with GI cocktail  -Protonix 40 mg daily   -Consider decreasing metformin to 500 mg bid and following up with PCP     Here at [facility] today the patient continues to discuss a vague discomfort in her lower abdomen. Etiology remains unclear.  3. Ongoing tobacco abuse: -Cessation is a must -Nicotine patch   4. DM2: -Uncontrolled -SSI  5. Sinusitis: -Augmentin per IM  6. SIRS: -Hypotension has corrected -On Augmentin  7. History of acute renal insufficiency: -Corrected  Signed, DUNN,RYAN [name] 06/03/2014, 5:36 PM  Patient seen and examined. I agree with the assessment and plan as detailed above. See also my additional thoughts below.   I have seen the patient. I have added some of my thoughts to the assessment and plan above. The overall plan is to proceed with consultation for bypass surgery. Christell Faith [name] will be calling the surgical team first thing tomorrow morning. It is still not clear why she has vague abdominal discomfort. It appears that Percocet helps with this. I will allow her to have some.  Dola Argyle, MD, [facility] 06/03/2014 6:05 PM

## 2014-06-04 ENCOUNTER — Other Ambulatory Visit: Payer: Self-pay | Admitting: *Deleted

## 2014-06-04 ENCOUNTER — Encounter: Payer: Self-pay | Admitting: Cardiovascular Disease

## 2014-06-04 ENCOUNTER — Encounter (HOSPITAL_COMMUNITY): Payer: Self-pay | Admitting: General Practice

## 2014-06-04 DIAGNOSIS — I517 Cardiomegaly: Secondary | ICD-10-CM

## 2014-06-04 DIAGNOSIS — I25110 Atherosclerotic heart disease of native coronary artery with unstable angina pectoris: Secondary | ICD-10-CM

## 2014-06-04 DIAGNOSIS — K219 Gastro-esophageal reflux disease without esophagitis: Secondary | ICD-10-CM

## 2014-06-04 DIAGNOSIS — I2510 Atherosclerotic heart disease of native coronary artery without angina pectoris: Secondary | ICD-10-CM

## 2014-06-04 DIAGNOSIS — E119 Type 2 diabetes mellitus without complications: Secondary | ICD-10-CM

## 2014-06-04 DIAGNOSIS — I10 Essential (primary) hypertension: Secondary | ICD-10-CM

## 2014-06-04 DIAGNOSIS — E1129 Type 2 diabetes mellitus with other diabetic kidney complication: Secondary | ICD-10-CM

## 2014-06-04 DIAGNOSIS — E1322 Other specified diabetes mellitus with diabetic chronic kidney disease: Secondary | ICD-10-CM

## 2014-06-04 DIAGNOSIS — IMO0002 Reserved for concepts with insufficient information to code with codable children: Secondary | ICD-10-CM

## 2014-06-04 LAB — CBC WITH DIFFERENTIAL/PLATELET
Basophils Absolute: 0 10*3/uL (ref 0.0–0.1)
Basophils Relative: 0 % (ref 0–1)
Eosinophils Absolute: 0.5 10*3/uL (ref 0.0–0.7)
Eosinophils Relative: 5 % (ref 0–5)
HCT: 31.9 % — ABNORMAL LOW (ref 36.0–46.0)
Hemoglobin: 10.5 g/dL — ABNORMAL LOW (ref 12.0–15.0)
Lymphocytes Relative: 21 % (ref 12–46)
Lymphs Abs: 1.7 10*3/uL (ref 0.7–4.0)
MCH: 24.9 pg — ABNORMAL LOW (ref 26.0–34.0)
MCHC: 32.9 g/dL (ref 30.0–36.0)
MCV: 75.6 fL — ABNORMAL LOW (ref 78.0–100.0)
Monocytes Absolute: 0.8 10*3/uL (ref 0.1–1.0)
Monocytes Relative: 10 % (ref 3–12)
Neutro Abs: 5.4 10*3/uL (ref 1.7–7.7)
Neutrophils Relative %: 64 % (ref 43–77)
Platelets: 231 10*3/uL (ref 150–400)
RBC: 4.22 MIL/uL (ref 3.87–5.11)
RDW: 14.8 % (ref 11.5–15.5)
WBC: 8.4 10*3/uL (ref 4.0–10.5)

## 2014-06-04 LAB — GLUCOSE, CAPILLARY
Glucose-Capillary: 220 mg/dL — ABNORMAL HIGH (ref 70–99)
Glucose-Capillary: 176 mg/dL — ABNORMAL HIGH (ref 70–99)
Glucose-Capillary: 131 mg/dL — ABNORMAL HIGH (ref 70–99)
Glucose-Capillary: 206 mg/dL — ABNORMAL HIGH (ref 70–99)
Glucose-Capillary: 148 mg/dL — ABNORMAL HIGH (ref 70–99)

## 2014-06-04 LAB — HEPARIN LEVEL (UNFRACTIONATED)
Heparin Unfractionated: 0.12 IU/mL — ABNORMAL LOW (ref 0.30–0.70)
Heparin Unfractionated: 0.21 IU/mL — ABNORMAL LOW (ref 0.30–0.70)
Heparin Unfractionated: 0.17 IU/mL — ABNORMAL LOW (ref 0.30–0.70)

## 2014-06-04 LAB — COMPREHENSIVE METABOLIC PANEL
ALT: 26 U/L (ref 0–35)
AST: 38 U/L — ABNORMAL HIGH (ref 0–37)
Albumin: 2.5 g/dL — ABNORMAL LOW (ref 3.5–5.2)
Alkaline Phosphatase: 80 U/L (ref 39–117)
Anion gap: 7 (ref 5–15)
BUN: 8 mg/dL (ref 6–23)
CO2: 26 mmol/L (ref 19–32)
Calcium: 8.6 mg/dL (ref 8.4–10.5)
Chloride: 103 mmol/L (ref 96–112)
Creatinine, Ser: 1.13 mg/dL — ABNORMAL HIGH (ref 0.50–1.10)
GFR calc Af Amer: 60 mL/min — ABNORMAL LOW (ref >90)
GFR calc non Af Amer: 52 mL/min — ABNORMAL LOW (ref >90)
Glucose, Bld: 189 mg/dL — ABNORMAL HIGH (ref 70–99)
Potassium: 3.9 mmol/L (ref 3.5–5.1)
Sodium: 136 mmol/L (ref 135–145)
Total Bilirubin: 0.2 mg/dL — ABNORMAL LOW (ref 0.3–1.2)
Total Protein: 5.8 g/dL — ABNORMAL LOW (ref 6.0–8.3)

## 2014-06-04 LAB — CBC
HCT: 32.5 % — ABNORMAL LOW (ref 36.0–46.0)
Hemoglobin: 10.8 g/dL — ABNORMAL LOW (ref 12.0–15.0)
MCH: 24.4 pg — ABNORMAL LOW (ref 26.0–34.0)
MCHC: 33.2 g/dL (ref 30.0–36.0)
MCV: 73.5 fL — ABNORMAL LOW (ref 78.0–100.0)
Platelets: 248 10*3/uL (ref 150–400)
RBC: 4.42 MIL/uL (ref 3.87–5.11)
RDW: 14.6 % (ref 11.5–15.5)
WBC: 8.9 10*3/uL (ref 4.0–10.5)

## 2014-06-04 LAB — MAGNESIUM: Magnesium: 1.7 mg/dL (ref 1.5–2.5)

## 2014-06-04 LAB — TSH: TSH: 1.002 u[IU]/mL (ref 0.350–4.500)

## 2014-06-04 LAB — APTT: aPTT: 85 seconds — ABNORMAL HIGH (ref 24–37)

## 2014-06-04 LAB — PROTIME-INR
INR: 1.17 (ref 0.00–1.49)
Prothrombin Time: 15 seconds (ref 11.6–15.2)

## 2014-06-04 LAB — AMYLASE: Amylase: 27 U/L (ref 0–105)

## 2014-06-04 LAB — LIPASE, BLOOD: Lipase: 19 U/L (ref 11–59)

## 2014-06-04 MED ORDER — PANTOPRAZOLE SODIUM 40 MG PO TBEC
40.0000 mg | DELAYED_RELEASE_TABLET | Freq: Two times a day (BID) | ORAL | Status: DC
  Administered 2014-06-05 – 2014-06-08 (×7): 40 mg via ORAL
  Filled 2014-06-04 (×9): qty 1

## 2014-06-04 MED ORDER — HEPARIN BOLUS VIA INFUSION
2500.0000 [IU] | Freq: Once | INTRAVENOUS | Status: AC
  Administered 2014-06-04: 2500 [IU] via INTRAVENOUS
  Filled 2014-06-04: qty 2500

## 2014-06-04 MED ORDER — HEPARIN (PORCINE) IN NACL 100-0.45 UNIT/ML-% IJ SOLN
2150.0000 [IU]/h | INTRAMUSCULAR | Status: DC
  Administered 2014-06-04 – 2014-06-08 (×7): 2150 [IU]/h via INTRAVENOUS
  Filled 2014-06-04 (×9): qty 250

## 2014-06-04 NOTE — Progress Notes (Signed)
Patient Name: Melinda [name] Date of Encounter: 06/04/2014  Principal Problem:   NSTEMI (non-ST elevated myocardial infarction) Active Problems:   HTN (hypertension)   DM2 (diabetes mellitus, type 2)   GERD (gastroesophageal reflux disease)   Primary Cardiologist: Dr. Ron Parker saw 02/03, she will f/u in [place]  Patient Profile: 59 yo female w/ no prev CAD went to [facility] w/ sinus pain, abd pain and chest pain. Peak troponin > 30, cath w/ 3 v dz, tx to [facility] for TCTS consult.   SUBJECTIVE: No chest pain. Still with abdominal tenderness RUQ & LLQ. Has been going on over a month, worse after MI, worse after meals. No BM 48 hr  OBJECTIVE Filed Vitals:   06/03/14 1700 06/03/14 2100 06/04/14 0352 06/04/14 0741  BP:  137/75 117/64   Pulse:  89 82 83  Temp:  99.5 F (37.5 C) 99.3 F (37.4 C)   TempSrc:  Oral Oral   Resp:  18 18   Height: 5' 8.5" (1.74 m)  5\' 8"  (1.727 m)   Weight: 245 lb (111.131 kg)  245 lb 12.8 oz (111.494 kg)   SpO2:  96% 97%     Intake/Output Summary (Last 24 hours) at 06/04/14 1041 Last data filed at 06/04/14 0350  Gross per 24 hour  Intake 186.22 ml  Output      0 ml  Net 186.22 ml   Filed Weights   06/03/14 1700 06/04/14 0352  Weight: 245 lb (111.131 kg) 245 lb 12.8 oz (111.494 kg)    PHYSICAL EXAM General: Well developed, well nourished, female in no acute distress. Head: Normocephalic, atraumatic.  Neck: Supple without bruits, JVD not elevated. Lungs:  Resp regular and unlabored, decreased BS bases Heart: RRR, S1, S2, no S3, S4, or murmur; no rub. Abdomen: Soft, tender RUQ, LLQ, non-distended, BS present but decreased Extremities: No clubbing, cyanosis, no edema.  Neuro: Alert and oriented X 3. Moves all extremities spontaneously. Psych: Normal affect.  LABS: CBC:  Recent Labs  06/04/14 0110  WBC 8.9  HGB 10.8*  HCT 32.5*  MCV 73.5*  PLT 248   TELE: SR, occ PVCs     Current Medications:  . amoxicillin-clavulanate  1 tablet Oral  Q12H  . aspirin EC  81 mg Oral Daily  . atorvastatin  80 mg Oral q1800  . carvedilol  6.25 mg Oral BID WC  . citalopram  40 mg Oral Daily  . gabapentin  600 mg Oral BID  . insulin aspart  0-10 Units Subcutaneous TID WC  . insulin glargine  35 Units Subcutaneous BID  . nicotine  21 mg Transdermal Daily  . pantoprazole  40 mg Oral Daily   . heparin 1,700 Units/hr (06/04/14 0201)    ASSESSMENT AND PLAN:   NSTEMI (non-ST elevated myocardial infarction)  -- cath at [facility] w/ 95% stenosis mLAD, occlusion ostial OM1, 70% stenosis LCx, 95% stenosis mRCA, EF 45%. No chest pain on heparin, ASA, BB, statin.     DM2 -- on SSI, resume diet    HTN -- good control on current rx    GERD -- Increase protonix to BID    Abd pain -- CT w/out contrast results from [facility] reviewed. No sig abnormalities. Abd is tender, not rigid. Mild constipation. Will increase PPI to bid, will ck amylase and lipase, but suspect they will be normal. Sx may be worse after eating, MD advise on eval for possible mesenteric ischemia.   Signed, Rosaria Ferries , [name] 10:41  AM 06/04/2014  Patient seen and examined. Agree with assessment and plan. No recurrent chest pain. Cath done yesterday by Dr. Ellyn Hack at [facility]; results reviewed. With DM agree with best long term benefit with CABG> TTS to see today.   Troy Sine, MD, [facility] 06/04/2014 12:06 PM

## 2014-06-04 NOTE — Progress Notes (Signed)
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  No Known Allergies  Patient Measurements: Height: 5\' 8"  (172.7 cm) Weight: 245 lb 12.8 oz (111.494 kg) IBW/kg (Calculated) : 63.9 Heparin Dosing Weight: 90 kg  Vital Signs: Temp: 98.4 F (36.9 C) (02/04 1406) Temp Source: Oral (02/04 1406) BP: 126/57 mmHg (02/04 1406) Pulse Rate: 89 (02/04 1406)  Labs:  Recent Labs  06/04/14 0110 06/04/14 0855 06/04/14 1840  HGB 10.8*  --  10.5*  HCT 32.5*  --  31.9*  PLT 248  --  231  APTT  --   --  85*  LABPROT  --   --  15.0  INR  --   --  1.17  HEPARINUNFRC 0.12* 0.21* 0.17*    CrCl cannot be calculated (Patient has no serum creatinine result on file.).  Assessment: 38 YOF continues on heparin for 3V CAD - plan for CABG on 2/8. Heparin level subtherapeutic - actually trended down after rate increase earlier. CBC stable. No issues with infusion per RN and no bleeding reported.  Goal of Therapy:  Heparin level 0.3-0.7 units/ml Monitor platelets by anticoagulation protocol: Yes  Plan:  - Bolus heparin 2500 units - Increase heparin gtt to 2150 units/hr - Check 6 hr HL - Daily HL/CBC  Sherlon Handing, PharmD, BCPS Clinical pharmacist, pager [phone] 06/04/2014, 8:14 PM

## 2014-06-04 NOTE — Progress Notes (Signed)
ANTICOAGULATION CONSULT NOTE - Follow Up Consult  Pharmacy Consult for heparin Indication: CAD awaiting TCTS consult   Labs:  Recent Labs  06/04/14 0110  HGB 10.8*  HCT 32.5*  PLT 248  HEPARINUNFRC 0.12*     Assessment: 58yo female subtherapeutic on heparin with initial dosing post-cath.  Goal of Therapy:  Heparin level 0.3-0.7 units/ml   Plan:  Will increase heparin gtt by 3 units/kg/hr to 1700 units/hr and check level in 6hr.  Wynona Neat, PharmD, BCPS  06/04/2014,1:43 AM

## 2014-06-04 NOTE — Progress Notes (Signed)
ANTICOAGULATION CONSULT NOTE - Follow Up Consult  Pharmacy Consult:  Heparin Indication:  S/p cath, awaiting TCTS consult  No Known Allergies  Patient Measurements: Height: 5\' 8"  (172.7 cm) Weight: 245 lb 12.8 oz (111.494 kg) IBW/kg (Calculated) : 63.9 Heparin Dosing Weight: 90 kg  Vital Signs: Temp: 99.3 F (37.4 C) (02/04 0352) Temp Source: Oral (02/04 0352) BP: 117/64 mmHg (02/04 0352) Pulse Rate: 83 (02/04 0741)  Labs:  Recent Labs  06/04/14 0110 06/04/14 0855  HGB 10.8*  --   HCT 32.5*  --   PLT 248  --   HEPARINUNFRC 0.12* 0.21*    CrCl cannot be calculated (Patient has no serum creatinine result on file.).     Assessment: Melinda [name] presented to [facility] with NSTEMI.  Her cath showed 3v-dz CAD and was then transferred to [facility] for TCTS evaluation.  Patient continues on IV heparin in the meantime.  Heparin level trending up but remains sub-therapeutic.  No issues with infusion per RN and no bleeding reported.   Goal of Therapy:  Heparin level 0.3-0.7 units/ml Monitor platelets by anticoagulation protocol: Yes    Plan:  - Increase heparin gtt to 1900 units/hr - Check 6 hr HL - Daily HL / CBC    Cecil Vandyke D. Mina Marble, PharmD, BCPS Pager:  [phone] 06/04/2014, 11:28 AM

## 2014-06-04 NOTE — Progress Notes (Signed)
  Echocardiogram 2D Echocardiogram has been performed.  Melinda [name] 06/04/2014, 4:31 PM

## 2014-06-04 NOTE — Consult Note (Signed)
[address]       [place], [address]             [phone]        Melinda [name] Medical Record #[id number] Date of Birth: 1956-03-27  Referring: Ellyn Hack Primary Care: Georgena Spurling, MD  Chief Complaint:  NSTEMI, CAD  History of Present Illness:      Melinda [name] is a 59 yo obese African American Female.  She presented to [facility] ED with complaints of headache, chest tightness, neck pain, nasal pain, nausea and overall just didn't feel well.  Workup in the ED revealed positive Troponin level, hypotension, and CT scan of sinuses confirmed a sinus infection.  She was started on Augmentin, Heparin and IV fluids.  She underwent cardiac catheterization which showed a mildly reduced EF of 45% and severe 3 vessel CAD.  It was felt CABG would be the best treatment of choice and she was transferred to [facility] for further care.  Currently the patient is chest pain free.  She does admit to a long term history of smoking for the past 44 years.  She is a diabetic and uses oral medications and insulin for coverage, but is ultimately uncontrolled.  She also has a history of hypertension.Patient has complaint of epigastric pain intermittently, with out n/v   Current Activity/ Functional Status: Patient is independent with mobility/ambulation, transfers, ADL's, IADL's.   Zubrod Score: At the time of surgery this patient's most appropriate activity status/level should be described as: []     0    Normal activity, no symptoms [x]     1    Restricted in physical strenuous activity but ambulatory, able to do out light work []     2    Ambulatory and capable of self care, unable to do work activities, up and about                 more than 50%  Of the time                            []     3    Only limited self care, in bed greater than 50% of waking hours []     4    Completely disabled, no self care, confined to bed or chair []     5    Moribund  Past Medical History    Diagnosis Date  . HTN (hypertension)   . Tobacco abuse   . Anxiety   . Peripheral neuropathy   . Obesity   . NSTEMI (non-ST elevated myocardial infarction) 06/2014  . Coronary artery disease   . Shortness of breath dyspnea   . DM2 (diabetes mellitus, type 2)     insulin dependent  . GERD (gastroesophageal reflux disease)   . OA (osteoarthritis) of knee     Past Surgical History  Procedure Laterality Date  . Breast biopsy    . Toe amputation Right 12/2011    rt middle toe  . Cardiac catheterization  06/2014    95% stenosis mLAD, occlusion ostial OM1, 70% stenosis LCx, 95% stenosis mRCA, EF 45%.  . US echocardiography  06/2014    EF 50-55%, HK of inf/post/inferolat walls, Ao sclerosis  . Ct abd w & pelvis wo cm  06/2014    nl liver, gallbladder, spleen, mild diverticular changes, no bowel wall inflammation, appendix nl, no hernia, no other sig  abnormalities    History  Smoking status  . Current Every Day Smoker -- 1.00 packs/day for 40 years  . Types: Cigarettes  Smokeless tobacco  . Never Used    History  Alcohol Use No    History   Social History  . Marital Status: Single    Spouse Name: N/A    Number of Children: N/A  . Years of Education: N/A   Occupational History  . Not on file.   Social History Main Topics  . Smoking status: Current Every Day Smoker -- 1.00 packs/day for 40 years    Types: Cigarettes  . Smokeless tobacco: Never Used  . Alcohol Use: No  . Drug Use: No  . Sexual Activity: Not on file   Other Topics Concern  . Not on file   Social History Narrative    No Known Allergies  Current Facility-Administered Medications  Medication Dose Route Frequency Provider Last Rate Last Dose  . acetaminophen (TYLENOL) tablet 650 mg  650 mg Oral Q4H PRN Areta Haber Dunn, [name]      . amoxicillin-clavulanate (AUGMENTIN) 875-125 MG per tablet 1 tablet  1 tablet Oral Q12H Rise Mu, [name]   1 tablet at 06/04/14 1137  . aspirin EC tablet 81 mg  81 mg Oral  Daily Ryan M Dunn, [name]   81 mg at 06/04/14 1137  . atorvastatin (LIPITOR) tablet 80 mg  80 mg Oral q1800 Areta Haber Dunn, [name]   80 mg at 06/03/14 1851  . carvedilol (COREG) tablet 6.25 mg  6.25 mg Oral BID WC Areta Haber Dunn, [name]   6.25 mg at 06/04/14 0741  . citalopram (CELEXA) tablet 40 mg  40 mg Oral Daily Ryan M Dunn, [name]   40 mg at 06/04/14 1137  . gabapentin (NEURONTIN) capsule 600 mg  600 mg Oral BID Areta Haber Dunn, [name]   600 mg at 06/04/14 1138  . heparin ADULT infusion 100 units/mL (25000 units/250 mL)  1,900 Units/hr Intravenous Continuous Saundra Shelling, RPH 19 mL/hr at 06/04/14 1139 1,900 Units/hr at 06/04/14 1139  . insulin aspart (novoLOG) injection 0-10 Units  0-10 Units Subcutaneous TID WC Rise Mu, [name]   2 Units at 06/04/14 [phone]  . insulin glargine (LANTUS) injection 35 Units  35 Units Subcutaneous BID Rise Mu, [name]   35 Units at 06/04/14 1138  . nicotine (NICODERM CQ - dosed in mg/24 hours) patch 21 mg  21 mg Transdermal Daily Ryan M Dunn, [name]   21 mg at 06/03/14 1815  . nitroGLYCERIN (NITROSTAT) SL tablet 0.4 mg  0.4 mg Sublingual Q5 Min x 3 PRN Areta Haber Dunn, [name]      . ondansetron [facility]) injection 4 mg  4 mg Intravenous Q6H PRN Areta Haber Dunn, [name]      . oxyCODONE-acetaminophen (PERCOCET/ROXICET) 5-325 MG per tablet 1-2 tablet  1-2 tablet Oral Q6H PRN Jules Husbands, MD   2 tablet at 06/04/14 1340  . pantoprazole (PROTONIX) EC tablet 40 mg  40 mg Oral BID AC Rhonda G Barrett, [name]   Stopped at 06/04/14 1600    Prescriptions prior to admission  Medication Sig Dispense Refill Last Dose  . acetaminophen (TYLENOL) 500 MG tablet Take 1,000 mg by mouth daily as needed for moderate pain (takes along with naprosyn).    06/03/2014 at Unknown time  . citalopram (CELEXA) 20 MG tablet Take 20 mg by mouth daily.   06/03/2014 at Unknown time  . gabapentin (NEURONTIN) 600 MG tablet Take  600 mg by mouth 2 (two) times daily.   06/03/2014 at am  . hydrochlorothiazide (HYDRODIURIL) 25 MG tablet Take  25 mg by mouth daily.   06/03/2014 at Unknown time  . Insulin Glargine (LANTUS) 100 UNIT/ML Solostar Pen Inject 35 Units into the skin 2 (two) times daily.   06/03/2014 at Unknown time  . metFORMIN (GLUCOPHAGE) 500 MG tablet Take 1,000 mg by mouth 2 (two) times daily.   06/03/2014 at Unknown time  . naproxen (NAPROSYN) 500 MG tablet Take 500 mg by mouth 2 (two) times daily. Takes along with tylenol   06/03/2014 at Unknown time  . pravastatin (PRAVACHOL) 40 MG tablet Take 40 mg by mouth every morning.   06/03/2014 at Unknown time    Family History  Problem Relation Age of Onset  . CAD Mother   . Hypertension Mother   . CAD Father      Review of Systems:  Constitutional: negative Respiratory: negative for cough, dyspnea on exertion and pneumonia Cardiovascular: positive for chest pain and fatigue Gastrointestinal: negative Neurological: negative Endocrine: positive for diabetic symptoms including neuropathy     Cardiac Review of Systems: Y or N  Chest Pain [ y   ]  Resting SOB [  n ] Exertional SOB  [n  ]  Orthopnea [  ]   Pedal Edema [   ]    Palpitations [  ] Syncope  [ n ]   Presyncope [   ]  General Review of Systems: [Y] = yes [  ]=no Constitional: recent weight change [  ]; anorexia [  ]; fatigue Blue.Reese  ]; nausea Blue.Reese  ]; night sweats [  ]; fever [  ]; or chills [  ]                                                               Dental: poor dentition[  ]; Last Dentist visit:   Eye : blurred vision [  ]; diplopia [   ]; vision changes [  ];  Amaurosis fugax[  ]; Resp: cough [ n ];  wheezing[n  ];  hemoptysis[  ]; shortness of breath[  ]; paroxysmal nocturnal dyspnea[  ]; dyspnea on exertion[ n ]; or orthopnea[  ];  GI:  gallstones[  ], vomiting[  ];  dysphagia[  ]; melena[  ];  hematochezia [  ]; heartburn[  ];   Hx of  Colonoscopy[  ]; GU: kidney stones [  ]; hematuria[  ];   dysuria [  ];  nocturia[  ];  history of     obstruction [  ]; urinary frequency [ n ]             Skin: rash, swelling[   ];, hair loss[  ];  peripheral edema[  ];  or itching[  ]; Musculosketetal: myalgias[  ];  joint swelling[  ];  joint erythema[  ];  joint pain[  ];  back pain[  ];  Heme/Lymph: bruising[  ];  bleeding[  ];  anemia[  ];  Neuro: TIA[  ];  headaches[  ];  stroke[  ];  vertigo[  ];  seizures[  ];   paresthesias[  ];  difficulty walking[ n ];  Psych:depression[  ]; anxiety[  ];  Endocrine: diabetes[  ];  thyroid dysfunction[  ];  Immunizations: Flu [?; Pneumococcal[ ? ];  Other:  Physical Exam: BP 126/57 mmHg  Pulse 89  Temp(Src) 98.4 F (36.9 C) (Oral)  Resp 18  Ht 5\' 8"  (1.727 m)  Wt 245 lb 12.8 oz (111.494 kg)  BMI 37.38 kg/m2  SpO2 98%   General appearance: alert, cooperative, no distress and mildly obese Head: Normocephalic, without obvious abnormality, atraumatic Resp: clear to auscultation bilaterally Cardio: regular rate and rhythm GI: soft, non-tender; bowel sounds normal; no masses,  no organomegaly Extremities: extremities normal, atraumatic, no cyanosis or edema Neurologic: Grossly normal Palpable pedal pulses dp and pt bil, right radial cath site  Diagnostic Studies & Laboratory data:     Recent Radiology Findings:   CLINICAL DATA: Generalized abdominal pain with fever, chills and body aches for 1 week. Diarrhea for 1 month. No history of malignancy or prior relevant surgery. Initial encounter.  EXAM: CT ABDOMEN AND PELVIS WITH CONTRAST  TECHNIQUE: Multidetector CT imaging of the abdomen and pelvis was performed using the standard protocol following bolus administration of intravenous contrast.  CONTRAST: 100 ml Omnipaque 300.  COMPARISON: Abdominal pelvic CT 08/06/2009.  FINDINGS: Lower chest: Mild basilar atelectasis and emphysematous changes. No significant pleural or pericardial effusion. A small hiatal hernia.  Hepatobiliary: The liver is normal in density without focal abnormality. No evidence of gallstones, gallbladder wall thickening or  biliary dilatation.  Pancreas: Unremarkable. No pancreatic ductal dilatation or surrounding inflammatory changes.  Spleen: Normal in size without focal abnormality.  Adrenals/Urinary Tract: Both adrenal glands appear normal.There is possible mild cortical scarring in the lower pole of the left kidney. The right kidney appears normal. There is no hydronephrosis or evidence of urinary tract calculus. The bladder appears unremarkable.  Stomach/Bowel: No evidence of bowel wall thickening, distention or surrounding inflammatory change.There are mild diverticular changes of the colon, greatest distally. The appendix appears normal.  Vascular/Lymphatic: Small lymph nodes within the porta hepatis are stable. There is stable aortoiliac atherosclerosis.  Reproductive: The uterus and ovaries appear unchanged. There is a small calcified uterine fibroid. No adnexal mass.  Other: No evidence of abdominal wall mass or hernia.  Musculoskeletal: No acute or significant osseous findings. There is stable ankylosis across the L4-5 and L5-S1 disc spaces. Degenerative changes are present at the hips and sacroiliac joints.  IMPRESSION: 1. No acute findings or explanation for the patient's symptoms demonstrated. 2. Stable colonic diverticulosis without evidence of acute inflammation. The appendix appears normal. 3. Atherosclerosis and spondylosis noted.  CLINICAL DATA: Acute onset of left maxillary pain. Hypotension, tachycardia, right submandibular lymphadenopathy and headache. Initial encounter.  EXAM: CT HEAD WITHOUT CONTRAST  CT MAXILLOFACIAL WITHOUT CONTRAST  TECHNIQUE: Multidetector CT imaging of the head and maxillofacial structures were performed using the standard protocol without intravenous contrast. Multiplanar CT image reconstructions of the maxillofacial structures were also generated.  COMPARISON: CT of the head performed 08/18/2012, and maxillofacial CT performed  11/30/2010  FINDINGS: CT HEAD FINDINGS  There is no evidence of acute infarction, mass lesion, or intra- or extra-axial hemorrhage on CT.  Mild periventricular white matter change likely reflects small vessel ischemic microangiopathy.  The posterior fossa, including the cerebellum, brainstem and fourth ventricle, is within normal limits. The third and lateral ventricles, and basal ganglia are unremarkable in appearance. The cerebral hemispheres are symmetric in appearance, with normal gray-white differentiation. No mass effect or midline shift is seen.  There is no evidence of fracture; visualized osseous structures are unremarkable in appearance. The orbits are  within normal limits. The paranasal sinuses and mastoid air cells are well-aerated. No significant soft tissue abnormalities are seen.  CT MAXILLOFACIAL FINDINGS  There is no evidence of fracture or dislocation. The maxilla and mandible appear intact. The nasal bone is unremarkable in appearance. There is absence of much of the dentition. Remaining visualized mandibular teeth are grossly unremarkable.  The orbits are intact bilaterally. There is partial opacification of the right side of the sphenoid sinus. The remaining visualized paranasal sinuses and mastoid air cells are well-aerated.  No significant soft tissue abnormalities are seen. The parapharyngeal fat planes are preserved. The nasopharynx, oropharynx and hypopharynx are unremarkable in appearance. The visualized portions of the valleculae and piriform sinuses are grossly unremarkable. Mild calcification is noted at the carotid bifurcations bilaterally.  The parotid and submandibular glands are within normal limits. No cervical lymphadenopathy is seen. No asymmetry is identified with regard to the visualized submandibular glands and cervical nodes. There is perhaps slight asymmetric prominence of the fat overlying the right masseter musculature and angle  of the mandible.  IMPRESSION: 1. No acute intracranial pathology seen on CT. 2. No evidence of fracture or dislocation with regard to the maxillofacial structures. 3. Mild small vessel ischemic microangiopathy. 4. Partial opacification of the right-sided sphenoid sinus. 5. The visualized submandibular glands and cervical nodes are grossly unremarkable. 6. Mild calcification at the carotid bifurcations bilaterally. Carotid ultrasound could be considered for further evaluation, when and as deemed clinically appropriate.   Electronically Signed By: Garald Balding M.D. On: 06/01/2014 20:55  ECHO: Study Conclusions  - Left ventricle: The cavity size was normal. There was moderate concentric hypertrophy. Systolic function was mildly to moderately reduced. The estimated ejection fraction was in the range of 40% to 45%. Severe hypokinesis of the entireinferolateral and inferior myocardium; consistent with infarction in the distribution of the right coronary or left circumflex coronary artery. Features are consistent with a pseudonormal left ventricular filling pattern, with concomitant abnormal relaxation and increased filling pressure (grade 2 diastolic dysfunction). - Mitral valve: There was mild regurgitation.   I have independently reviewed the above radiologic studies.  Cardiac Cath: Severe multivessel coronary artery disease involving the mid LAD diagonal obtuse marginal and distal circumflex mild to moderately reduced left ventricular ejection fraction with lateral inferior apical hypokinesis marked elevation of left ventricular end-diastolic pressure to 37-85 minute LAD 70% diagonal discrete 80% circumflex 30 mid right 40 distal right 90   I have independently reviewed the above  cath films and reviewed the findings with the  patient .    Lab Results  Component Value Date   WBC 8.9 06/04/2014   HGB 10.8* 06/04/2014   HCT 32.5* 06/04/2014   PLT 248  06/04/2014   Cardiac Panel (last 3 results) No results for input(s): CKTOTAL, CKMB, TROPONINI, RELINDX in the last 72 hours.    Plans:    1. NSTEMI, CAD-- continue Coreg, Heparin The estimated ejection fraction was in the range of 40% to 45%. Severe hypokinesis of the entireinferolateral and inferior myocardium; consistent with infarction in the distribution of the right coronary or left circumflex coronary artery. With myocardial infarction February 1 2. DM- continue SSIP, Lantus-will benefit from diabetes education- patient notes last known hemoglobin A1c is elevated over 12 3. Sinus Infection- continue Augmentin 4. Atherosclerosis and spondylosis noted by CT 5. Mild calcification at the carotid bifurcations bilaterally by CT carotid Doppler studies pending 6. Stable colonic diverticulosis without evidence of acute      Inflammation by CT  I've reviewed the patient's history and cardiac catheterization films and x-rays with her. With history of diabetes significant 3 vessel coronary artery disease depressed ejection fraction to 40-45% I've recommended to the patient that we proceed with coronary artery bypass grafting. Risks and options of discussed with her in detail . The goals risks and alternatives of the planned surgical procedure coronary artery bypass grafting  have been discussed with the patient in detail. The risks of the procedure including death, infection, stroke, myocardial infarction, bleeding, blood transfusion have all been discussed specifically.  I have quoted Melinda [name] a 4 % of perioperative mortality and a complication rate as high as 35 %. The patient's questions have been answered.Melinda [name] is willing  to proceed with the planned procedure.  Plan to proceed with surgical revascularization on Monday, February 8.  I  spent 60 minutes counseling the patient face to face and 50% or more the  time was spent in counseling and coordination of care. The  total time spent in the appointment was 80 minutes.   Grace Isaac MD      [address] [place] [address] Office [phone]   Beeper [phone]  06/04/2014 7:44 PM

## 2014-06-04 NOTE — Progress Notes (Signed)
Utilization review completed.  

## 2014-06-05 ENCOUNTER — Inpatient Hospital Stay (HOSPITAL_COMMUNITY): Payer: Medicaid Other

## 2014-06-05 DIAGNOSIS — E118 Type 2 diabetes mellitus with unspecified complications: Secondary | ICD-10-CM

## 2014-06-05 DIAGNOSIS — I2510 Atherosclerotic heart disease of native coronary artery without angina pectoris: Secondary | ICD-10-CM

## 2014-06-05 DIAGNOSIS — Z01810 Encounter for preprocedural cardiovascular examination: Secondary | ICD-10-CM

## 2014-06-05 LAB — PULMONARY FUNCTION TEST
DL/VA % pred: 125 %
DL/VA: 6.61 ml/min/mmHg/L
DLCO cor % pred: 87 %
DLCO cor: 26.07 ml/min/mmHg
DLCO unc % pred: 78 %
DLCO unc: 23.2 ml/min/mmHg
FEF 25-75 Post: 1.4 L/sec
FEF 25-75 Pre: 0.95 L/sec
FEF2575-%Change-Post: 46 %
FEF2575-%Pred-Post: 59 %
FEF2575-%Pred-Pre: 40 %
FEV1-%Change-Post: 8 %
FEV1-%Pred-Post: 75 %
FEV1-%Pred-Pre: 69 %
FEV1-Post: 1.84 L
FEV1-Pre: 1.69 L
FEV1FVC-%Change-Post: 8 %
FEV1FVC-%Pred-Pre: 87 %
FEV6-%Change-Post: 1 %
FEV6-%Pred-Post: 81 %
FEV6-%Pred-Pre: 79 %
FEV6-Post: 2.44 L
FEV6-Pre: 2.41 L
FEV6FVC-%Change-Post: 1 %
FEV6FVC-%Pred-Post: 103 %
FEV6FVC-%Pred-Pre: 101 %
FVC-%Change-Post: 0 %
FVC-%Pred-Post: 78 %
FVC-%Pred-Pre: 78 %
FVC-Post: 2.44 L
FVC-Pre: 2.45 L
Post FEV1/FVC ratio: 75 %
Post FEV6/FVC ratio: 100 %
Pre FEV1/FVC ratio: 69 %
Pre FEV6/FVC Ratio: 98 %
RV % pred: 132 %
RV: 2.91 L
TLC % pred: 93 %
TLC: 5.3 L

## 2014-06-05 LAB — COMPREHENSIVE METABOLIC PANEL
ALT: 25 U/L (ref 0–35)
AST: 29 U/L (ref 0–37)
Albumin: 2.4 g/dL — ABNORMAL LOW (ref 3.5–5.2)
Alkaline Phosphatase: 79 U/L (ref 39–117)
Anion gap: 4 — ABNORMAL LOW (ref 5–15)
BUN: 11 mg/dL (ref 6–23)
CO2: 29 mmol/L (ref 19–32)
Calcium: 8.5 mg/dL (ref 8.4–10.5)
Chloride: 105 mmol/L (ref 96–112)
Creatinine, Ser: 1.16 mg/dL — ABNORMAL HIGH (ref 0.50–1.10)
GFR calc Af Amer: 58 mL/min — ABNORMAL LOW (ref >90)
GFR calc non Af Amer: 50 mL/min — ABNORMAL LOW (ref >90)
Glucose, Bld: 135 mg/dL — ABNORMAL HIGH (ref 70–99)
Potassium: 3.9 mmol/L (ref 3.5–5.1)
Sodium: 138 mmol/L (ref 135–145)
Total Bilirubin: 0.3 mg/dL (ref 0.3–1.2)
Total Protein: 5.7 g/dL — ABNORMAL LOW (ref 6.0–8.3)

## 2014-06-05 LAB — CBC
HCT: 31 % — ABNORMAL LOW (ref 36.0–46.0)
Hemoglobin: 10.3 g/dL — ABNORMAL LOW (ref 12.0–15.0)
MCH: 24.6 pg — ABNORMAL LOW (ref 26.0–34.0)
MCHC: 33.2 g/dL (ref 30.0–36.0)
MCV: 74.2 fL — ABNORMAL LOW (ref 78.0–100.0)
Platelets: 238 10*3/uL (ref 150–400)
RBC: 4.18 MIL/uL (ref 3.87–5.11)
RDW: 14.8 % (ref 11.5–15.5)
WBC: 8.6 10*3/uL (ref 4.0–10.5)

## 2014-06-05 LAB — IRON AND TIBC
Iron: 18 ug/dL — ABNORMAL LOW (ref 42–145)
Saturation Ratios: 8 % — ABNORMAL LOW (ref 20–55)
TIBC: 223 ug/dL — ABNORMAL LOW (ref 250–470)
UIBC: 205 ug/dL (ref 125–400)

## 2014-06-05 LAB — GLUCOSE, CAPILLARY
Glucose-Capillary: 135 mg/dL — ABNORMAL HIGH (ref 70–99)
Glucose-Capillary: 151 mg/dL — ABNORMAL HIGH (ref 70–99)
Glucose-Capillary: 148 mg/dL — ABNORMAL HIGH (ref 70–99)

## 2014-06-05 LAB — HEPARIN LEVEL (UNFRACTIONATED)
Heparin Unfractionated: 0.49 IU/mL (ref 0.30–0.70)
Heparin Unfractionated: 0.46 IU/mL (ref 0.30–0.70)

## 2014-06-05 MED ORDER — ALBUTEROL SULFATE (2.5 MG/3ML) 0.083% IN NEBU
2.5000 mg | INHALATION_SOLUTION | Freq: Once | RESPIRATORY_TRACT | Status: AC
  Administered 2014-06-05: 2.5 mg via RESPIRATORY_TRACT

## 2014-06-05 NOTE — Progress Notes (Signed)
06/05/2014 6:30 PM Nursing note Pt. And family viewed video [phone] preparing for heart surgery. Questions encouraged.  Alante Weimann, Arville Lime

## 2014-06-05 NOTE — Plan of Care (Signed)
Problem: Food- and Nutrition-Related Knowledge Deficit (NB-1.1) Goal: Nutrition education Formal process to instruct or train a patient/client in a skill or to impart knowledge to help patients/clients voluntarily manage or modify food choices and eating behavior to maintain or improve health. Outcome: Completed/Met Date Met:  06/05/14  RD consulted for nutrition education regarding diabetes.   No results found for: HGBA1C  RD provided "Carbohydrate Counting for People with Diabetes" handout from the Academy of Nutrition and Dietetics. Discussed different food groups and their effects on blood sugar, emphasizing carbohydrate-containing foods. Provided list of carbohydrates and recommended serving sizes of common foods.  Discussed importance of controlled and consistent carbohydrate intake throughout the day. Recommended 4-5 servings of carbohydrates at each meal. Emphasized the importance of adequate protein needs. Provided examples of ways to balance meals/snacks and encouraged intake of high-fiber, whole grain complex carbohydrates. Discussed diabetic friendly drink options.Teach back method used.  Expect good compliance.  Body mass index is 37.73 kg/(m^2). Pt meets criteria for Class II obesity based on current BMI.  Current diet order is carbohydrate modified, patient is consuming approximately 75-90% of meals at this time. Labs and medications reviewed. No further nutrition interventions warranted at this time. RD contact information provided. If additional nutrition issues arise, please re-consult RD.  Kallie Locks, MS, RD, LDN Pager # [phone] After hours/ weekend pager # [phone]

## 2014-06-05 NOTE — Progress Notes (Signed)
ANTICOAGULATION CONSULT NOTE - Follow Up Consult  Pharmacy Consult for Heparin  Indication: 3v CAD, awaiting CABG  No Known Allergies  Patient Measurements: Height: 5\' 8"  (172.7 cm) Weight: 248 lb 1.6 oz (112.537 kg) IBW/kg (Calculated) : 63.9 Vital Signs: Temp: 98.1 F (36.7 C) (02/05 0551) Temp Source: Oral (02/05 0551) BP: 119/69 mmHg (02/05 0551) Pulse Rate: 75 (02/05 0551)  Labs:  Recent Labs  06/04/14 0110 06/04/14 0855 06/04/14 1840 06/05/14 0540  HGB 10.8*  --  10.5* 10.3*  HCT 32.5*  --  31.9* 31.0*  PLT 248  --  231 238  APTT  --   --  85*  --   LABPROT  --   --  15.0  --   INR  --   --  1.17  --   HEPARINUNFRC 0.12* 0.21* 0.17* 0.49  CREATININE  --   --  1.13*  --     Estimated Creatinine Clearance: 69.6 mL/min (by C-G formula based on Cr of 1.13).   Assessment: Therapeutic heparin level after rate increase  Goal of Therapy:  Heparin level 0.3-0.7 units/ml Monitor platelets by anticoagulation protocol: Yes   Plan:  -Continue heparin at 2150 units/hr -1200 HL -Daily CBC/HL -Monitor for bleeding  Narda Bonds 06/05/2014,6:33 AM

## 2014-06-05 NOTE — Progress Notes (Signed)
   06/05/14 1500  Clinical Encounter Type  Visited With Patient  Visit Type Initial;Spiritual support;Social support  Spiritual Encounters  Spiritual Needs Sacred text  Stress Factors  Patient Stress Factors Other (Comment) (surgery on Monday)  Family Stress Factors (surgery on monday)   Chaplain was contacted and informed that the patient would like a [place]. Chaplain brought a KJV Bible to the patient. Patient is having open heart surgery on Monday. Patient claimed that she has confidence that God is going to be with her through that process. Chaplain let the patient know that he will keep her in his prayers on Monday. Chaplain will continue to provide emotional and spiritual care for patient as needed. Gar Ponto, Chaplain  3:23 PM

## 2014-06-05 NOTE — Progress Notes (Signed)
[address]       [place] [address]             [phone]                   Procedure(s) (LRB): CORONARY ARTERY BYPASS GRAFTING (CABG) (N/A) TRANSESOPHAGEAL ECHOCARDIOGRAM (TEE) (N/A)  LOS: 2 days   Subjective: No chest pain, no abdominal pain Objective: Vital signs in last 24 hours: Patient Vitals for the past 24 hrs:  BP Temp Temp src Pulse Resp SpO2 Weight  06/05/14 1425 125/65 mmHg 98.8 F (37.1 C) Oral 77 18 98 % -  06/05/14 0551 119/69 mmHg 98.1 F (36.7 C) Oral 75 18 97 % 248 lb 1.6 oz (112.537 kg)  06/04/14 2119 (!) 107/54 mmHg 98.6 F (37 C) Oral 80 18 95 % -    Filed Weights   06/03/14 1700 06/04/14 0352 06/05/14 0551  Weight: 245 lb (111.131 kg) 245 lb 12.8 oz (111.494 kg) 248 lb 1.6 oz (112.537 kg)    Hemodynamic parameters for last 24 hours:    Intake/Output from previous day: 02/04 0701 - 02/05 0700 In: 240 [P.O.:240] Out: -  Intake/Output this shift: Total I/O In: 240 [P.O.:240] Out: -   Scheduled Meds: . amoxicillin-clavulanate  1 tablet Oral Q12H  . aspirin EC  81 mg Oral Daily  . atorvastatin  80 mg Oral q1800  . carvedilol  6.25 mg Oral BID WC  . citalopram  40 mg Oral Daily  . gabapentin  600 mg Oral BID  . insulin aspart  0-10 Units Subcutaneous TID WC  . insulin glargine  35 Units Subcutaneous BID  . nicotine  21 mg Transdermal Daily  . pantoprazole  40 mg Oral BID AC   Continuous Infusions: . heparin 2,150 Units/hr (06/05/14 1050)   PRN Meds:.acetaminophen, nitroGLYCERIN, ondansetron (ZOFRAN) IV, oxyCODONE-acetaminophen  General appearance: alert and cooperative Neurologic: intact Heart: regular rate and rhythm, S1, S2 normal, no murmur, click, rub or gallop Lungs: clear to auscultation bilaterally Abdomen: soft, non-tender; bowel sounds normal; no masses,  no organomegaly Extremities: extremities normal, atraumatic, no cyanosis or edema and Homans sign is negative, no sign of DVT  Lab  Results: CBC: Recent Labs  06/04/14 1840 06/05/14 0540  WBC 8.4 8.6  HGB 10.5* 10.3*  HCT 31.9* 31.0*  PLT 231 238   BMET:  Recent Labs  06/04/14 1840 06/05/14 0540  NA 136 138  K 3.9 3.9  CL 103 105  CO2 26 29  GLUCOSE 189* 135*  BUN 8 11  CREATININE 1.13* 1.16*  CALCIUM 8.6 8.5    PT/INR:  Recent Labs  06/04/14 1840  LABPROT 15.0  INR 1.17     Radiology Dg Chest 2 View  06/05/2014   CLINICAL DATA:  Acute MI.  EXAM: CHEST  2 VIEW  COMPARISON:  None.  FINDINGS: Mediastinum and hilar structures are normal. Subsegmental atelectasis lung bases. No pleural effusion or pneumothorax. Borderline cardiomegaly. No pulmonary venous congestion. No acute osseous abnormality.  IMPRESSION: 1. Bibasilar subsegmental atelectasis. 2. Borderline cardiomegaly.  No pulmonary venous congestion.   Electronically Signed   By: Marcello Moores  Register   On: 06/05/2014 07:12     Assessment/Plan: S/P Procedure(s) (LRB): CORONARY ARTERY BYPASS GRAFTING (CABG) (N/A) TRANSESOPHAGEAL ECHOCARDIOGRAM (TEE) (N/A) For CABG Monday 2/8  The goals risks and alternatives of the planned surgical procedure CABG have been discussed with the patient in detail. The risks of the procedure including death, infection, stroke,  myocardial infarction, bleeding, blood transfusion have all been discussed specifically.  I have quoted Venita Sheffield a 4% of perioperative mortality and a complication rate as high as 25 %. The patient's questions have been answered.ARNETTE DRIGGS is willing  to proceed with the planned procedure.   Grace Isaac MD 06/05/2014 4:43 PM

## 2014-06-05 NOTE — Progress Notes (Signed)
Pre-op Cardiac Surgery  Carotid Findings:  Bilateral:  1-39% ICA stenosis.  Vertebral artery flow is antegrade.      Upper Extremity Right Left  Brachial Pressures 139 142  Radial Waveforms Tri Tri  Ulnar Waveforms Tri Tri  Palmar Arch (Allen's Test) Obliterates with radial and ulnar compression Decreases >50% with radial compression, normal with ulnar compression   Findings:    ABI indicates moderate arterial disease bilaterally.  Lower  Extremity ABI Right Left  Dorsalis Pedis    Anterior Tibial 98, biphasic 90, biphasic  Posterior Tibial 95, biphasic 92, biphasic  Ankle/Brachial Indices 0.69 0.65    Landry Mellow, RDMS, RVT 06/05/2014

## 2014-06-05 NOTE — Progress Notes (Signed)
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  No Known Allergies  Patient Measurements: Height: 5\' 8"  (172.7 cm) Weight: 248 lb 1.6 oz (112.537 kg) IBW/kg (Calculated) : 63.9 Heparin Dosing Weight: 90 kg  Vital Signs: Temp: 98.1 F (36.7 C) (02/05 0551) Temp Source: Oral (02/05 0551) BP: 119/69 mmHg (02/05 0551) Pulse Rate: 75 (02/05 0551)  Labs:  Recent Labs  06/04/14 0110  06/04/14 1840 06/05/14 0540 06/05/14 1241  HGB 10.8*  --  10.5* 10.3*  --   HCT 32.5*  --  31.9* 31.0*  --   PLT 248  --  231 238  --   APTT  --   --  85*  --   --   LABPROT  --   --  15.0  --   --   INR  --   --  1.17  --   --   HEPARINUNFRC 0.12*  < > 0.17* 0.49 0.46  CREATININE  --   --  1.13* 1.16*  --   < > = values in this interval not displayed.  Estimated Creatinine Clearance: 67.8 mL/min (by C-G formula based on Cr of 1.16).  Assessment: 29 YOF continues on heparin for 3V CAD - plan for CABG on 2/8. Heparin level (0.46) therapeutic on 2150 units/hr . CBC stable. No issues with infusion per RN and no bleeding reported.  Goal of Therapy:  Heparin level 0.3-0.7 units/ml Monitor platelets by anticoagulation protocol: Yes  Plan:  - Continue heparin gtt 2150 units/hr - Daily HL/CBC  Maryanna Shape, PharmD, BCPS  Clinical Pharmacist  Pager: [phone]   06/05/2014, 2:00 PM

## 2014-06-05 NOTE — Progress Notes (Signed)
CARDIAC REHAB PHASE I   PRE:  Rate/Rhythm: 91 SR  BP:  Supine:   Sitting: 110/70  Standing:    SaO2: 98%RA  MODE:  Ambulation: 250 ft   POST:  Rate/Rhythm: 105 ST  BP:  Supine:   Sitting: 108/70  Standing:    SaO2: 94%RA 0950-1042 Pt walked 250 ft on RA with steady gait. Stopped twice to rest her legs that hurt from arthritis. No CP. Discussed sternal precautions and pt able to get up and down without use of arms. Has IS and can get above 1000 ml. Pt stated family will be able to care for her at discharge. Discussed smoking cessation and pt stated she has quit. Gave pt OHS booklet and care guide. Wrote down and showed pt how to pull up preop video to watch when family here.   Graylon Good, RN [name]  06/05/2014 10:39 AM

## 2014-06-05 NOTE — Progress Notes (Signed)
Patient Name: Melinda [name] Date of Encounter: 06/05/2014  Principal Problem:   NSTEMI (non-ST elevated myocardial infarction) Active Problems:   HTN (hypertension)   DM2 (diabetes mellitus, type 2)   GERD (gastroesophageal reflux disease)   Primary Cardiologist: Dr. Ron Parker saw 02/03, she will f/u in [place]  Patient Profile: 59 yo female w/ no prev CAD went to [facility] w/ sinus pain, abd pain and chest pain. Peak troponin > 30, cath w/ 3 v dz, tx to [facility] for TCTS consult, surgery 02/08   SUBJECTIVE: No chest pain or SOB, would like to speak to a nutritionist.  OBJECTIVE Filed Vitals:   06/04/14 0741 06/04/14 1406 06/04/14 2119 06/05/14 0551  BP:  126/57 107/54 119/69  Pulse: 83 89 80 75  Temp:  98.4 F (36.9 C) 98.6 F (37 C) 98.1 F (36.7 C)  TempSrc:  Oral Oral Oral  Resp:  18 18 18   Height:      Weight:    248 lb 1.6 oz (112.537 kg)  SpO2:  98% 95% 97%    Intake/Output Summary (Last 24 hours) at 06/05/14 0748 Last data filed at 06/04/14 1836  Gross per 24 hour  Intake    240 ml  Output      0 ml  Net    240 ml   Filed Weights   06/03/14 1700 06/04/14 0352 06/05/14 0551  Weight: 245 lb (111.131 kg) 245 lb 12.8 oz (111.494 kg) 248 lb 1.6 oz (112.537 kg)    PHYSICAL EXAM General: Well developed, well nourished, female in no acute distress. Head: Normocephalic, atraumatic.  Neck: Supple without bruits, JVD not elevated. Lungs:  Resp regular and unlabored, CTA. Heart: RRR, S1, S2, no S3, S4, or murmur; no rub. Abdomen: Soft, non-tender, non-distended, BS + x 4.  Extremities: No clubbing, cyanosis, no edema.  Neuro: Alert and oriented X 3. Moves all extremities spontaneously. Psych: Normal affect.  LABS: CBC: Recent Labs  06/04/14 1840 06/05/14 0540  WBC 8.4 8.6  NEUTROABS 5.4  --   HGB 10.5* 10.3*  HCT 31.9* 31.0*  MCV 75.6* 74.2*  PLT 231 238   INR: Recent Labs  06/04/14 1840  INR 3.38   Basic Metabolic Panel: Recent Labs   06/04/14 1840 06/05/14 0540  NA 136 138  K 3.9 3.9  CL 103 105  CO2 26 29  GLUCOSE 189* 135*  BUN 8 11  CREATININE 1.13* 1.16*  CALCIUM 8.6 8.5  MG 1.7  --    Liver Function Tests: Recent Labs  06/04/14 1840 06/05/14 0540  AST 38* 29  ALT 26 25  ALKPHOS 80 79  BILITOT 0.2* 0.3  PROT 5.8* 5.7*  ALBUMIN 2.5* 2.4*   Thyroid Function Tests: Recent Labs  06/04/14 1830  TSH 1.002   Amylase    Component Value Date/Time   AMYLASE 27 06/04/2014 0855   Lipase     Component Value Date/Time   LIPASE 19 06/04/2014 0855    TELE:   SR, occ PVCs  Radiology/Studies: Dg Chest 2 View 06/05/2014   CLINICAL DATA:  Acute MI.  EXAM: CHEST  2 VIEW  COMPARISON:  None.  FINDINGS: Mediastinum and hilar structures are normal. Subsegmental atelectasis lung bases. No pleural effusion or pneumothorax. Borderline cardiomegaly. No pulmonary venous congestion. No acute osseous abnormality.  IMPRESSION: 1. Bibasilar subsegmental atelectasis. 2. Borderline cardiomegaly.  No pulmonary venous congestion.   Electronically Signed   By: Marcello Moores  Register   On: 06/05/2014 07:12  Current Medications:  . albuterol  2.5 mg Nebulization Once  . amoxicillin-clavulanate  1 tablet Oral Q12H  . aspirin EC  81 mg Oral Daily  . atorvastatin  80 mg Oral q1800  . carvedilol  6.25 mg Oral BID WC  . citalopram  40 mg Oral Daily  . gabapentin  600 mg Oral BID  . insulin aspart  0-10 Units Subcutaneous TID WC  . insulin glargine  35 Units Subcutaneous BID  . nicotine  21 mg Transdermal Daily  . pantoprazole  40 mg Oral BID AC   . heparin 2,150 Units/hr (06/04/14 2253)    ASSESSMENT AND PLAN:  NSTEMI (non-ST elevated myocardial infarction)  -- cath at [facility] w/ 95% stenosis mLAD, occlusion ostial OM1, 70% stenosis LCx, 95% stenosis mRCA, EF 45%. No chest pain on heparin, ASA, BB, statin. CABG 02/08   DM2 -- on SSI, resume diet -- DM nutrition consult   HTN -- good control on current rx   GERD -- On  protonix to BID   Abd pain -- CT w/out contrast results from [facility], No sig abnormalities.  -- Amylase and lipase are normal -- Abd is tender, not rigid.  -- Mild constipation.  -- on PPI bid. Sx may be worse after eating, follow  Signed, Rosaria Ferries , [name] 7:48 AM 06/05/2014   Patient seen and examined. Agree with assessment and plan. No recurrent chest pain or shortness of breath. Appreciate Dr. Everrett Coombe assessment. For CABG on Monday 2/8;  Will check Fe studies with microcytic anemia and MCV of 74-75. BP stable on carvedilol. Would benefit from ACE-I/ARB with DM; BP 107 today, 2 days post contrast with cath; will not start now but should start post-op when BP allows.  Troy Sine, MD, [facility] 06/05/2014 9:09 AM

## 2014-06-06 DIAGNOSIS — I2510 Atherosclerotic heart disease of native coronary artery without angina pectoris: Secondary | ICD-10-CM | POA: Insufficient documentation

## 2014-06-06 DIAGNOSIS — E119 Type 2 diabetes mellitus without complications: Secondary | ICD-10-CM

## 2014-06-06 LAB — FERRITIN: Ferritin: 208 ng/mL (ref 10–291)

## 2014-06-06 LAB — GLUCOSE, CAPILLARY
Glucose-Capillary: 147 mg/dL — ABNORMAL HIGH (ref 70–99)
Glucose-Capillary: 179 mg/dL — ABNORMAL HIGH (ref 70–99)
Glucose-Capillary: 138 mg/dL — ABNORMAL HIGH (ref 70–99)
Glucose-Capillary: 123 mg/dL — ABNORMAL HIGH (ref 70–99)

## 2014-06-06 LAB — CBC
HCT: 33.4 % — ABNORMAL LOW (ref 36.0–46.0)
Hemoglobin: 11 g/dL — ABNORMAL LOW (ref 12.0–15.0)
MCH: 24.4 pg — ABNORMAL LOW (ref 26.0–34.0)
MCHC: 32.9 g/dL (ref 30.0–36.0)
MCV: 74.1 fL — ABNORMAL LOW (ref 78.0–100.0)
Platelets: 298 10*3/uL (ref 150–400)
RBC: 4.51 MIL/uL (ref 3.87–5.11)
RDW: 15 % (ref 11.5–15.5)
WBC: 9 10*3/uL (ref 4.0–10.5)

## 2014-06-06 LAB — HEMOGLOBIN A1C
Hgb A1c MFr Bld: 10.7 % — ABNORMAL HIGH (ref 4.8–5.6)
Mean Plasma Glucose: 260 mg/dL

## 2014-06-06 LAB — CULTURE, BLOOD (SINGLE)

## 2014-06-06 LAB — HEPARIN LEVEL (UNFRACTIONATED): Heparin Unfractionated: 0.4 IU/mL (ref 0.30–0.70)

## 2014-06-06 NOTE — Progress Notes (Signed)
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  No Known Allergies  Patient Measurements: Height: 5\' 8"  (172.7 cm) Weight: 245 lb 9.5 oz (111.4 kg) IBW/kg (Calculated) : 63.9 Heparin Dosing Weight: 90 kg  Vital Signs: Temp: 97.7 F (36.5 C) (02/06 0446) Temp Source: Oral (02/06 0446) BP: 122/64 mmHg (02/06 0446) Pulse Rate: 70 (02/06 0446)  Labs:  Recent Labs  06/04/14 1840 06/05/14 0540 06/05/14 1241 06/06/14 0337  HGB 10.5* 10.3*  --  11.0*  HCT 31.9* 31.0*  --  33.4*  PLT 231 238  --  298  APTT 85*  --   --   --   LABPROT 15.0  --   --   --   INR 1.17  --   --   --   HEPARINUNFRC 0.17* 0.49 0.46 0.40  CREATININE 1.13* 1.16*  --   --     Estimated Creatinine Clearance: 67.5 mL/min (by C-G formula based on Cr of 1.16).  Assessment: 63 YOF continues on heparin for 3V CAD - plan for CABG on 2/8. Heparin level (0.4) therapeutic/stable on 2150 units/hr . CBC stable. No issues with infusion per RN and no bleeding reported.  Goal of Therapy:  Heparin level 0.3-0.7 units/ml Monitor platelets by anticoagulation protocol: Yes  Plan:  - Continue heparin gtt 2150 units/hr - Daily HL/CBC  Uvaldo Rising, BCPS  Clinical Pharmacist Pager [phone]  06/06/2014 11:17 AM

## 2014-06-06 NOTE — Progress Notes (Signed)
CARDIAC REHAB PHASE I   PRE:  Rate/Rhythm: 67 sinus  BP:  Supine:   Sitting: 114/62  Standing:    SaO2: 99 RA  MODE:  Ambulation: 350 ft   POST:  Rate/Rhythem: 88  BP:  Supine:   Sitting: 126/56  Standing:    SaO2: 100 RA Pt ambulated 350 ft with assist x1.  Pt tolerated walk well but did need three standing rest breaks for leg fatigue.  Pt encouraged to continue to walk with staff over weekend.  We will f/u after surgery next week. Pt did have some questions about her atypical presentation and future chest pain events. Answered questions and pt stated understanding. Alberteen Sam, MA, ACSM RCEP [phone]  Clotilde Dieter

## 2014-06-06 NOTE — Progress Notes (Signed)
SUBJECTIVE:  No complaints this am  OBJECTIVE:   Vitals:   Filed Vitals:   06/05/14 0551 06/05/14 1425 06/05/14 2030 06/06/14 0446  BP: 119/69 125/65 119/62 122/64  Pulse: 75 77 76 70  Temp: 98.1 F (36.7 C) 98.8 F (37.1 C) 98.3 F (36.8 C) 97.7 F (36.5 C)  TempSrc: Oral Oral Oral Oral  Resp: 18 18 20 19   Height:      Weight: 248 lb 1.6 oz (112.537 kg)   245 lb 9.5 oz (111.4 kg)  SpO2: 97% 98% 99% 94%   I&O's:   Intake/Output Summary (Last 24 hours) at 06/06/14 0834 Last data filed at 06/05/14 1300  Gross per 24 hour  Intake    240 ml  Output      0 ml  Net    240 ml   TELEMETRY: Reviewed telemetry pt in NSR:     PHYSICAL EXAM General: Well developed, well nourished, in no acute distress Head: Eyes PERRLA, No xanthomas.   Normal cephalic and atramatic  Lungs:   Clear bilaterally to auscultation and percussion. Heart:   HRRR S1 S2 Pulses are 2+ & equal. Abdomen: Bowel sounds are positive, abdomen soft and non-tender without masses Extremities:   No clubbing, cyanosis or edema.  DP +1 Neuro: Alert and oriented X 3. Psych:  Good affect, responds appropriately   LABS: Basic Metabolic Panel:  Recent Labs  06/04/14 1840 06/05/14 0540  NA 136 138  K 3.9 3.9  CL 103 105  CO2 26 29  GLUCOSE 189* 135*  BUN 8 11  CREATININE 1.13* 1.16*  CALCIUM 8.6 8.5  MG 1.7  --    Liver Function Tests:  Recent Labs  06/04/14 1840 06/05/14 0540  AST 38* 29  ALT 26 25  ALKPHOS 80 79  BILITOT 0.2* 0.3  PROT 5.8* 5.7*  ALBUMIN 2.5* 2.4*    Recent Labs  06/04/14 0855  LIPASE 19  AMYLASE 27   CBC:  Recent Labs  06/04/14 1840 06/05/14 0540 06/06/14 0337  WBC 8.4 8.6 9.0  NEUTROABS 5.4  --   --   HGB 10.5* 10.3* 11.0*  HCT 31.9* 31.0* 33.4*  MCV 75.6* 74.2* 74.1*  PLT 231 238 298   Cardiac Enzymes: No results for input(s): CKTOTAL, CKMB, CKMBINDEX, TROPONINI in the last 72 hours. BNP: Invalid input(s): POCBNP D-Dimer: No results for input(s):  DDIMER in the last 72 hours. Hemoglobin A1C:  Recent Labs  06/04/14 1840  HGBA1C 10.7*   Fasting Lipid Panel: No results for input(s): CHOL, HDL, LDLCALC, TRIG, CHOLHDL, LDLDIRECT in the last 72 hours. Thyroid Function Tests:  Recent Labs  06/04/14 1830  TSH 1.002   Anemia Panel:  Recent Labs  06/05/14 1014  TIBC 223*  IRON 18*   Coag Panel:   Lab Results  Component Value Date   INR 1.17 06/04/2014    RADIOLOGY: Dg Chest 2 View  06/05/2014   CLINICAL DATA:  Acute MI.  EXAM: CHEST  2 VIEW  COMPARISON:  None.  FINDINGS: Mediastinum and hilar structures are normal. Subsegmental atelectasis lung bases. No pleural effusion or pneumothorax. Borderline cardiomegaly. No pulmonary venous congestion. No acute osseous abnormality.  IMPRESSION: 1. Bibasilar subsegmental atelectasis. 2. Borderline cardiomegaly.  No pulmonary venous congestion.   Electronically Signed   By: Marcello Moores  Register   On: 06/05/2014 07:12   ASSESSMENT AND PLAN: 1.   NSTEMI (non-ST elevated myocardial infarction)  -- cath at [facility] w/ 95% stenosis mLAD, occlusion ostial OM1, 70%  stenosis LCx, 95% stenosis mRCA, EF 45%. No chest pain on heparin, ASA, BB, statin. For CABG 02/08  2.  DM2 -- on SSI, resume diet -- DM nutrition consult  3.  HTN -- good control on current rx  4.  GERD -- On protonix to BID  5.   Abd pain -- CT w/out contrast results from [facility], No sig abnormalities.  -- Amylase and lipase are normal -- Abd is tender, not rigid.  -- Mild constipation.  -- on PPI bid. Sx may be worse after eating, follow  Sueanne Margarita, MD  06/06/2014  8:34 AM

## 2014-06-07 LAB — GLUCOSE, CAPILLARY
Glucose-Capillary: 123 mg/dL — ABNORMAL HIGH (ref 70–99)
Glucose-Capillary: 178 mg/dL — ABNORMAL HIGH (ref 70–99)
Glucose-Capillary: 150 mg/dL — ABNORMAL HIGH (ref 70–99)
Glucose-Capillary: 128 mg/dL — ABNORMAL HIGH (ref 70–99)

## 2014-06-07 LAB — BLOOD GAS, ARTERIAL
Acid-Base Excess: 3.3 mmol/L — ABNORMAL HIGH (ref 0.0–2.0)
Bicarbonate: 27.3 mEq/L — ABNORMAL HIGH (ref 20.0–24.0)
Drawn by: 249101
FIO2: 0.21 %
O2 Saturation: 91.9 %
Patient temperature: 98.6
TCO2: 28.6 mmol/L (ref 0–100)
pCO2 arterial: 41.6 mmHg (ref 35.0–45.0)
pH, Arterial: 7.432 (ref 7.350–7.450)
pO2, Arterial: 65.2 mmHg — ABNORMAL LOW (ref 80.0–100.0)

## 2014-06-07 LAB — URINALYSIS, ROUTINE W REFLEX MICROSCOPIC
Bilirubin Urine: NEGATIVE
Glucose, UA: NEGATIVE mg/dL
Hgb urine dipstick: NEGATIVE
Ketones, ur: NEGATIVE mg/dL
Leukocytes, UA: NEGATIVE
Nitrite: NEGATIVE
Protein, ur: NEGATIVE mg/dL
Specific Gravity, Urine: 1.011 (ref 1.005–1.030)
Urobilinogen, UA: 1 mg/dL (ref 0.0–1.0)
pH: 5.5 (ref 5.0–8.0)

## 2014-06-07 LAB — BASIC METABOLIC PANEL
Anion gap: 6 (ref 5–15)
BUN: 11 mg/dL (ref 6–23)
CO2: 29 mmol/L (ref 19–32)
Calcium: 8.6 mg/dL (ref 8.4–10.5)
Chloride: 103 mmol/L (ref 96–112)
Creatinine, Ser: 1.02 mg/dL (ref 0.50–1.10)
GFR calc Af Amer: 68 mL/min — ABNORMAL LOW (ref >90)
GFR calc non Af Amer: 59 mL/min — ABNORMAL LOW (ref >90)
Glucose, Bld: 141 mg/dL — ABNORMAL HIGH (ref 70–99)
Potassium: 4.6 mmol/L (ref 3.5–5.1)
Sodium: 138 mmol/L (ref 135–145)

## 2014-06-07 LAB — CBC
HCT: 31.6 % — ABNORMAL LOW (ref 36.0–46.0)
HCT: 32 % — ABNORMAL LOW (ref 36.0–46.0)
Hemoglobin: 10.6 g/dL — ABNORMAL LOW (ref 12.0–15.0)
Hemoglobin: 10.6 g/dL — ABNORMAL LOW (ref 12.0–15.0)
MCH: 24.9 pg — ABNORMAL LOW (ref 26.0–34.0)
MCH: 24.5 pg — ABNORMAL LOW (ref 26.0–34.0)
MCHC: 33.5 g/dL (ref 30.0–36.0)
MCHC: 33.1 g/dL (ref 30.0–36.0)
MCV: 74.2 fL — ABNORMAL LOW (ref 78.0–100.0)
MCV: 74.1 fL — ABNORMAL LOW (ref 78.0–100.0)
Platelets: 300 10*3/uL (ref 150–400)
Platelets: 327 10*3/uL (ref 150–400)
RBC: 4.26 MIL/uL (ref 3.87–5.11)
RBC: 4.32 MIL/uL (ref 3.87–5.11)
RDW: 14.8 % (ref 11.5–15.5)
RDW: 14.8 % (ref 11.5–15.5)
WBC: 9.6 10*3/uL (ref 4.0–10.5)
WBC: 9.8 10*3/uL (ref 4.0–10.5)

## 2014-06-07 LAB — TYPE AND SCREEN
ABO/RH(D): O POS
Antibody Screen: NEGATIVE

## 2014-06-07 LAB — PROTIME-INR
INR: 1.09 (ref 0.00–1.49)
Prothrombin Time: 14.3 seconds (ref 11.6–15.2)

## 2014-06-07 LAB — SURGICAL PCR SCREEN
MRSA, PCR: NEGATIVE
Staphylococcus aureus: NEGATIVE

## 2014-06-07 LAB — APTT: aPTT: 173 seconds — ABNORMAL HIGH (ref 24–37)

## 2014-06-07 LAB — HEPARIN LEVEL (UNFRACTIONATED): Heparin Unfractionated: 0.55 IU/mL (ref 0.30–0.70)

## 2014-06-07 LAB — ABO/RH: ABO/RH(D): O POS

## 2014-06-07 MED ORDER — METOPROLOL TARTRATE 12.5 MG HALF TABLET
12.5000 mg | ORAL_TABLET | Freq: Once | ORAL | Status: AC
  Administered 2014-06-08: 12.5 mg via ORAL
  Filled 2014-06-07: qty 1

## 2014-06-07 MED ORDER — DEXTROSE 5 % IV SOLN
30.0000 ug/min | INTRAVENOUS | Status: DC
  Filled 2014-06-07: qty 2

## 2014-06-07 MED ORDER — CHLORHEXIDINE GLUCONATE CLOTH 2 % EX PADS: 6.0000 | MEDICATED_PAD | Freq: Once | CUTANEOUS | Status: DC

## 2014-06-07 MED ORDER — DEXMEDETOMIDINE HCL IN NACL 400 MCG/100ML IV SOLN
0.1000 ug/kg/h | INTRAVENOUS | Status: AC
  Administered 2014-06-08: 0.3 ug/kg/h via INTRAVENOUS
  Filled 2014-06-07: qty 100

## 2014-06-07 MED ORDER — PLASMA-LYTE 148 IV SOLN
INTRAVENOUS | Status: AC
  Administered 2014-06-08: 500 mL
  Filled 2014-06-07: qty 2.5

## 2014-06-07 MED ORDER — SODIUM CHLORIDE 0.9 % IV SOLN
INTRAVENOUS | Status: AC
  Administered 2014-06-08: 70 mL/h via INTRAVENOUS
  Filled 2014-06-07 (×2): qty 40

## 2014-06-07 MED ORDER — MAGNESIUM SULFATE 50 % IJ SOLN
40.0000 meq | INTRAMUSCULAR | Status: DC
  Filled 2014-06-07: qty 10

## 2014-06-07 MED ORDER — TEMAZEPAM 7.5 MG PO CAPS: 15.0000 mg | ORAL_CAPSULE | Freq: Once | ORAL | Status: AC | PRN

## 2014-06-07 MED ORDER — DEXTROSE 5 % IV SOLN
750.0000 mg | INTRAVENOUS | Status: DC
  Filled 2014-06-07: qty 750

## 2014-06-07 MED ORDER — POTASSIUM CHLORIDE 2 MEQ/ML IV SOLN
80.0000 meq | INTRAVENOUS | Status: DC
  Filled 2014-06-07: qty 40

## 2014-06-07 MED ORDER — BISACODYL 5 MG PO TBEC: 5.0000 mg | DELAYED_RELEASE_TABLET | Freq: Once | ORAL | Status: DC

## 2014-06-07 MED ORDER — DEXTROSE 5 % IV SOLN
1.5000 g | INTRAVENOUS | Status: AC
  Administered 2014-06-08: 1.5 g via INTRAVENOUS
  Administered 2014-06-08: .75 g via INTRAVENOUS
  Filled 2014-06-07: qty 1.5

## 2014-06-07 MED ORDER — NITROGLYCERIN IN D5W 200-5 MCG/ML-% IV SOLN
2.0000 ug/min | INTRAVENOUS | Status: AC
  Administered 2014-06-08: 10 ug/min via INTRAVENOUS
  Filled 2014-06-07: qty 250

## 2014-06-07 MED ORDER — EPINEPHRINE HCL 1 MG/ML IJ SOLN
0.0000 ug/min | INTRAVENOUS | Status: DC
  Filled 2014-06-07: qty 4

## 2014-06-07 MED ORDER — VANCOMYCIN HCL 10 G IV SOLR
1500.0000 mg | INTRAVENOUS | Status: AC
  Administered 2014-06-08: 1500 mg via INTRAVENOUS
  Filled 2014-06-07: qty 1500

## 2014-06-07 MED ORDER — SODIUM CHLORIDE 0.9 % IV SOLN
INTRAVENOUS | Status: DC
  Filled 2014-06-07: qty 30

## 2014-06-07 MED ORDER — SODIUM CHLORIDE 0.9 % IV SOLN
INTRAVENOUS | Status: AC
  Administered 2014-06-08: 1 [IU]/h via INTRAVENOUS
  Filled 2014-06-07: qty 2.5

## 2014-06-07 MED ORDER — DOPAMINE-DEXTROSE 3.2-5 MG/ML-% IV SOLN
0.0000 ug/kg/min | INTRAVENOUS | Status: DC
  Filled 2014-06-07: qty 250

## 2014-06-07 NOTE — Progress Notes (Signed)
SUBJECTIVE:  No complaints this am. Surgery tomorrow    OBJECTIVE:   Vitals:   Filed Vitals:   06/06/14 1401 06/06/14 1658 06/06/14 2024 06/07/14 0451  BP: 122/68 139/68 127/64 120/64  Pulse: 71 71 72 68  Temp: 97.7 F (36.5 C)  98.5 F (36.9 C) 98.1 F (36.7 C)  TempSrc: Oral  Oral Oral  Resp: 18  19 20   Height:      Weight:    245 lb 1.6 oz (111.177 kg)  SpO2: 100%  96% 96%   I&O's:    Intake/Output Summary (Last 24 hours) at 06/07/14 0757 Last data filed at 06/06/14 1812  Gross per 24 hour  Intake    720 ml  Output      0 ml  Net    720 ml   TELEMETRY: Reviewed telemetry pt in NSR:     PHYSICAL EXAM General: Well developed, well nourished, in no acute distress Head: Eyes PERRLA, No xanthomas.   Normal cephalic and atramatic  Lungs:   Clear bilaterally to auscultation and percussion. Heart:   HRRR S1 S2 Pulses are 2+ & equal. Abdomen: Bowel sounds are positive, abdomen soft and non-tender without masses Extremities:   No clubbing, cyanosis or edema.  DP +1 Neuro: Alert and oriented X 3. Psych:  Good affect, responds appropriately   LABS: Basic Metabolic Panel:  Recent Labs  06/04/14 1840 06/05/14 0540  NA 136 138  K 3.9 3.9  CL 103 105  CO2 26 29  GLUCOSE 189* 135*  BUN 8 11  CREATININE 1.13* 1.16*  CALCIUM 8.6 8.5  MG 1.7  --    Liver Function Tests:  Recent Labs  06/04/14 1840 06/05/14 0540  AST 38* 29  ALT 26 25  ALKPHOS 80 79  BILITOT 0.2* 0.3  PROT 5.8* 5.7*  ALBUMIN 2.5* 2.4*    Recent Labs  06/04/14 0855  LIPASE 19  AMYLASE 27   CBC:  Recent Labs  06/04/14 1840  06/06/14 0337 06/07/14 0415  WBC 8.4  < > 9.0 9.6  NEUTROABS 5.4  --   --   --   HGB 10.5*  < > 11.0* 10.6*  HCT 31.9*  < > 33.4* 31.6*  MCV 75.6*  < > 74.1* 74.2*  PLT 231  < > 298 300  < > = values in this interval not displayed. Cardiac Enzymes:   Recent Labs  06/04/14 1840  HGBA1C 10.7*   Thyroid Function Tests:  Recent Labs   06/04/14 1830  TSH 1.002   Anemia Panel:  Recent Labs  06/05/14 1014 06/06/14 0337  FERRITIN  --  208  TIBC 223*  --   IRON 18*  --    Coag Panel:   Lab Results  Component Value Date   INR 1.17 06/04/2014    RADIOLOGY: Dg Chest 2 View  06/05/2014   CLINICAL DATA:  Acute MI.  EXAM: CHEST  2 VIEW  COMPARISON:  None.  FINDINGS: Mediastinum and hilar structures are normal. Subsegmental atelectasis lung bases. No pleural effusion or pneumothorax. Borderline cardiomegaly. No pulmonary venous congestion. No acute osseous abnormality.  IMPRESSION: 1. Bibasilar subsegmental atelectasis. 2. Borderline cardiomegaly.  No pulmonary venous congestion.   Electronically Signed   By: Marcello Moores  Register   On: 06/05/2014 07:12   ASSESSMENT AND PLAN: 1.   NSTEMI (non-ST elevated myocardial infarction)  -- cath at [facility] w/ 95% stenosis mLAD, occlusion ostial OM1, 70% stenosis LCx, 95% stenosis mRCA, EF 45%. No chest  pain on heparin, ASA, BB, statin. For CABG 02/08  2.  DM2- poorly controlled. HgA1c 10.7 -- on SSI, resume diet -- DM nutrition consult  3.  HTN -- good control on current rx  4.  GERD -- On protonix to BID  5.   Abd pain -- CT w/out contrast results from [facility], No sig abnormalities.  -- Amylase and lipase are normal -- Abd is tender, not rigid.  -- Mild constipation.  -- on PPI bid. Sx may be worse after eating, follow  6. Anemia of chronic disease.   Eileen Stanford, [name]  06/07/2014  7:57 AM

## 2014-06-07 NOTE — Progress Notes (Signed)
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  No Known Allergies  Patient Measurements: Height: 5\' 8"  (172.7 cm) Weight: 245 lb 1.6 oz (111.177 kg) IBW/kg (Calculated) : 63.9 Heparin Dosing Weight: 90 kg  Vital Signs: Temp: 98.1 F (36.7 C) (02/07 0451) Temp Source: Oral (02/07 0451) BP: 120/64 mmHg (02/07 0451) Pulse Rate: 68 (02/07 0451)  Labs:  Recent Labs  06/04/14 1840 06/05/14 0540 06/05/14 1241 06/06/14 0337 06/07/14 0415 06/07/14 0944  HGB 10.5* 10.3*  --  11.0* 10.6* 10.6*  HCT 31.9* 31.0*  --  33.4* 31.6* 32.0*  PLT 231 238  --  298 300 327  APTT 85*  --   --   --   --  173*  LABPROT 15.0  --   --   --   --  14.3  INR 1.17  --   --   --   --  1.09  HEPARINUNFRC 0.17* 0.49 0.46 0.40 0.55  --   CREATININE 1.13* 1.16*  --   --   --  1.02    Estimated Creatinine Clearance: 76.7 mL/min (by C-G formula based on Cr of 1.02).  Assessment: 11 YOF continues on heparin for 3V CAD - plan for CABG on 2/8. Heparin level (0.55) therapeutic/stable on 2150 units/hr . CBC stable. No issues with infusion per RN and no bleeding reported.  Goal of Therapy:  Heparin level 0.3-0.7 units/ml Monitor platelets by anticoagulation protocol: Yes  Plan:  - Continue heparin gtt 2150 units/hr - Daily HL/CBC - F/u p CABG tomorrow  Uvaldo Rising, BCPS  Clinical Pharmacist Pager [phone]  06/07/2014 11:36 AM

## 2014-06-08 ENCOUNTER — Inpatient Hospital Stay (HOSPITAL_COMMUNITY): Payer: Medicaid Other | Admitting: Anesthesiology

## 2014-06-08 ENCOUNTER — Inpatient Hospital Stay (HOSPITAL_COMMUNITY): Payer: Medicaid Other

## 2014-06-08 ENCOUNTER — Encounter (HOSPITAL_COMMUNITY)
Admission: AD | Disposition: A | Discharge status: Home / Self Care | Payer: MEDICAID | Source: Other Acute Inpatient Hospital | Attending: Cardiothoracic Surgery

## 2014-06-08 DIAGNOSIS — I2510 Atherosclerotic heart disease of native coronary artery without angina pectoris: Secondary | ICD-10-CM | POA: Diagnosis present

## 2014-06-08 HISTORY — PX: CORONARY ARTERY BYPASS GRAFT: SHX141

## 2014-06-08 HISTORY — PX: TEE WITHOUT CARDIOVERSION: SHX5443

## 2014-06-08 LAB — POCT I-STAT 3, ART BLOOD GAS (G3+)
Acid-Base Excess: 2 mmol/L (ref 0.0–2.0)
Acid-Base Excess: 7 mmol/L — ABNORMAL HIGH (ref 0.0–2.0)
Acid-Base Excess: 2 mmol/L (ref 0.0–2.0)
Acid-base deficit: 1 mmol/L (ref 0.0–2.0)
Acid-base deficit: 2 mmol/L (ref 0.0–2.0)
Acid-base deficit: 1 mmol/L (ref 0.0–2.0)
Bicarbonate: 29.1 mEq/L — ABNORMAL HIGH (ref 20.0–24.0)
Bicarbonate: 30.6 mEq/L — ABNORMAL HIGH (ref 20.0–24.0)
Bicarbonate: 23.9 mEq/L (ref 20.0–24.0)
Bicarbonate: 24.1 mEq/L — ABNORMAL HIGH (ref 20.0–24.0)
Bicarbonate: 25.4 mEq/L — ABNORMAL HIGH (ref 20.0–24.0)
Bicarbonate: 27.6 mEq/L — ABNORMAL HIGH (ref 20.0–24.0)
O2 Saturation: 100 %
O2 Saturation: 100 %
O2 Saturation: 88 %
O2 Saturation: 86 %
O2 Saturation: 95 %
O2 Saturation: 93 %
Patient temperature: 36.5
Patient temperature: 36.2
Patient temperature: 36.6
TCO2: 31 mmol/L (ref 0–100)
TCO2: 32 mmol/L (ref 0–100)
TCO2: 25 mmol/L (ref 0–100)
TCO2: 25 mmol/L (ref 0–100)
TCO2: 27 mmol/L (ref 0–100)
TCO2: 29 mmol/L (ref 0–100)
pCO2 arterial: 55.1 mmHg — ABNORMAL HIGH (ref 35.0–45.0)
pCO2 arterial: 40.6 mmHg (ref 35.0–45.0)
pCO2 arterial: 38.9 mmHg (ref 35.0–45.0)
pCO2 arterial: 45 mmHg (ref 35.0–45.0)
pCO2 arterial: 47.6 mmHg — ABNORMAL HIGH (ref 35.0–45.0)
pCO2 arterial: 48 mmHg — ABNORMAL HIGH (ref 35.0–45.0)
pH, Arterial: 7.332 — ABNORMAL LOW (ref 7.350–7.450)
pH, Arterial: 7.486 — ABNORMAL HIGH (ref 7.350–7.450)
pH, Arterial: 7.396 (ref 7.350–7.450)
pH, Arterial: 7.333 — ABNORMAL LOW (ref 7.350–7.450)
pH, Arterial: 7.331 — ABNORMAL LOW (ref 7.350–7.450)
pH, Arterial: 7.366 (ref 7.350–7.450)
pO2, Arterial: 241 mmHg — ABNORMAL HIGH (ref 80.0–100.0)
pO2, Arterial: 391 mmHg — ABNORMAL HIGH (ref 80.0–100.0)
pO2, Arterial: 54 mmHg — ABNORMAL LOW (ref 80.0–100.0)
pO2, Arterial: 54 mmHg — ABNORMAL LOW (ref 80.0–100.0)
pO2, Arterial: 79 mmHg — ABNORMAL LOW (ref 80.0–100.0)
pO2, Arterial: 68 mmHg — ABNORMAL LOW (ref 80.0–100.0)

## 2014-06-08 LAB — POCT I-STAT, CHEM 8
BUN: 11 mg/dL (ref 6–23)
BUN: 10 mg/dL (ref 6–23)
BUN: 8 mg/dL (ref 6–23)
BUN: 10 mg/dL (ref 6–23)
BUN: 10 mg/dL (ref 6–23)
BUN: 8 mg/dL (ref 6–23)
Calcium, Ion: 1.2 mmol/L (ref 1.13–1.30)
Calcium, Ion: 0.97 mmol/L — ABNORMAL LOW (ref 1.13–1.30)
Calcium, Ion: 1.19 mmol/L (ref 1.13–1.30)
Calcium, Ion: 1.08 mmol/L — ABNORMAL LOW (ref 1.13–1.30)
Calcium, Ion: 1.09 mmol/L — ABNORMAL LOW (ref 1.13–1.30)
Calcium, Ion: 1.2 mmol/L (ref 1.13–1.30)
Chloride: 103 mmol/L (ref 96–112)
Chloride: 102 mmol/L (ref 96–112)
Chloride: 105 mmol/L (ref 96–112)
Chloride: 103 mmol/L (ref 96–112)
Chloride: 106 mmol/L (ref 96–112)
Chloride: 111 mmol/L (ref 96–112)
Creatinine, Ser: 0.9 mg/dL (ref 0.50–1.10)
Creatinine, Ser: 0.7 mg/dL (ref 0.50–1.10)
Creatinine, Ser: 1 mg/dL (ref 0.50–1.10)
Creatinine, Ser: 0.9 mg/dL (ref 0.50–1.10)
Creatinine, Ser: 0.9 mg/dL (ref 0.50–1.10)
Creatinine, Ser: 1 mg/dL (ref 0.50–1.10)
Glucose, Bld: 131 mg/dL — ABNORMAL HIGH (ref 70–99)
Glucose, Bld: 120 mg/dL — ABNORMAL HIGH (ref 70–99)
Glucose, Bld: 136 mg/dL — ABNORMAL HIGH (ref 70–99)
Glucose, Bld: 131 mg/dL — ABNORMAL HIGH (ref 70–99)
Glucose, Bld: 156 mg/dL — ABNORMAL HIGH (ref 70–99)
Glucose, Bld: 122 mg/dL — ABNORMAL HIGH (ref 70–99)
HCT: 33 % — ABNORMAL LOW (ref 36.0–46.0)
HCT: 26 % — ABNORMAL LOW (ref 36.0–46.0)
HCT: 31 % — ABNORMAL LOW (ref 36.0–46.0)
HCT: 26 % — ABNORMAL LOW (ref 36.0–46.0)
HCT: 26 % — ABNORMAL LOW (ref 36.0–46.0)
HCT: 27 % — ABNORMAL LOW (ref 36.0–46.0)
Hemoglobin: 11.2 g/dL — ABNORMAL LOW (ref 12.0–15.0)
Hemoglobin: 10.5 g/dL — ABNORMAL LOW (ref 12.0–15.0)
Hemoglobin: 8.8 g/dL — ABNORMAL LOW (ref 12.0–15.0)
Hemoglobin: 8.8 g/dL — ABNORMAL LOW (ref 12.0–15.0)
Hemoglobin: 8.8 g/dL — ABNORMAL LOW (ref 12.0–15.0)
Hemoglobin: 9.2 g/dL — ABNORMAL LOW (ref 12.0–15.0)
Potassium: 4.4 mmol/L (ref 3.5–5.1)
Potassium: 4.4 mmol/L (ref 3.5–5.1)
Potassium: 4.4 mmol/L (ref 3.5–5.1)
Potassium: 5.5 mmol/L — ABNORMAL HIGH (ref 3.5–5.1)
Potassium: 5 mmol/L (ref 3.5–5.1)
Potassium: 4.2 mmol/L (ref 3.5–5.1)
Sodium: 141 mmol/L (ref 135–145)
Sodium: 143 mmol/L (ref 135–145)
Sodium: 144 mmol/L (ref 135–145)
Sodium: 139 mmol/L (ref 135–145)
Sodium: 141 mmol/L (ref 135–145)
Sodium: 146 mmol/L — ABNORMAL HIGH (ref 135–145)
TCO2: 25 mmol/L (ref 0–100)
TCO2: 24 mmol/L (ref 0–100)
TCO2: 25 mmol/L (ref 0–100)
TCO2: 26 mmol/L (ref 0–100)
TCO2: 21 mmol/L (ref 0–100)
TCO2: 23 mmol/L (ref 0–100)

## 2014-06-08 LAB — CBC
HCT: 27.3 % — ABNORMAL LOW (ref 36.0–46.0)
HCT: 25.5 % — ABNORMAL LOW (ref 36.0–46.0)
Hemoglobin: 9.1 g/dL — ABNORMAL LOW (ref 12.0–15.0)
Hemoglobin: 8.5 g/dL — ABNORMAL LOW (ref 12.0–15.0)
MCH: 25.1 pg — ABNORMAL LOW (ref 26.0–34.0)
MCH: 24.7 pg — ABNORMAL LOW (ref 26.0–34.0)
MCHC: 33.3 g/dL (ref 30.0–36.0)
MCHC: 33.3 g/dL (ref 30.0–36.0)
MCV: 75.2 fL — ABNORMAL LOW (ref 78.0–100.0)
MCV: 74.1 fL — ABNORMAL LOW (ref 78.0–100.0)
Platelets: 208 10*3/uL (ref 150–400)
Platelets: 229 10*3/uL (ref 150–400)
RBC: 3.63 MIL/uL — ABNORMAL LOW (ref 3.87–5.11)
RBC: 3.44 MIL/uL — ABNORMAL LOW (ref 3.87–5.11)
RDW: 15 % (ref 11.5–15.5)
RDW: 15 % (ref 11.5–15.5)
WBC: 11.9 10*3/uL — ABNORMAL HIGH (ref 4.0–10.5)
WBC: 10.8 10*3/uL — ABNORMAL HIGH (ref 4.0–10.5)

## 2014-06-08 LAB — PROTIME-INR
INR: 1.28 (ref 0.00–1.49)
Prothrombin Time: 16.1 seconds — ABNORMAL HIGH (ref 11.6–15.2)

## 2014-06-08 LAB — MAGNESIUM: Magnesium: 2.8 mg/dL — ABNORMAL HIGH (ref 1.5–2.5)

## 2014-06-08 LAB — CREATININE, SERUM
Creatinine, Ser: 0.99 mg/dL (ref 0.50–1.10)
GFR calc Af Amer: 70 mL/min — ABNORMAL LOW (ref >90)
GFR calc non Af Amer: 61 mL/min — ABNORMAL LOW (ref >90)

## 2014-06-08 LAB — HEMOGLOBIN AND HEMATOCRIT, BLOOD
HCT: 24.3 % — ABNORMAL LOW (ref 36.0–46.0)
Hemoglobin: 8.2 g/dL — ABNORMAL LOW (ref 12.0–15.0)

## 2014-06-08 LAB — GLUCOSE, CAPILLARY: Glucose-Capillary: 105 mg/dL — ABNORMAL HIGH (ref 70–99)

## 2014-06-08 LAB — POCT I-STAT 4, (NA,K, GLUC, HGB,HCT)
Glucose, Bld: 138 mg/dL — ABNORMAL HIGH (ref 70–99)
HCT: 29 % — ABNORMAL LOW (ref 36.0–46.0)
Hemoglobin: 9.9 g/dL — ABNORMAL LOW (ref 12.0–15.0)
Potassium: 4.2 mmol/L (ref 3.5–5.1)
Sodium: 144 mmol/L (ref 135–145)

## 2014-06-08 LAB — APTT: aPTT: 35 seconds (ref 24–37)

## 2014-06-08 LAB — HEMOGLOBIN A1C
Hgb A1c MFr Bld: 10.5 % — ABNORMAL HIGH (ref 4.8–5.6)
Mean Plasma Glucose: 255 mg/dL

## 2014-06-08 LAB — PLATELET COUNT: Platelets: 257 10*3/uL (ref 150–400)

## 2014-06-08 SURGERY — CORONARY ARTERY BYPASS GRAFTING (CABG)
Anesthesia: General | Site: Chest

## 2014-06-08 MED ORDER — ROCURONIUM BROMIDE 100 MG/10ML IV SOLN
INTRAVENOUS | Status: DC | PRN
  Administered 2014-06-08 (×3): 50 mg via INTRAVENOUS

## 2014-06-08 MED ORDER — OXYCODONE HCL 5 MG PO TABS
5.0000 mg | ORAL_TABLET | ORAL | Status: DC | PRN
  Administered 2014-06-09 (×3): 10 mg via ORAL
  Administered 2014-06-10 (×2): 5 mg via ORAL
  Administered 2014-06-10 – 2014-06-13 (×4): 10 mg via ORAL
  Filled 2014-06-08 (×2): qty 2
  Filled 2014-06-08: qty 1
  Filled 2014-06-08: qty 2
  Filled 2014-06-08: qty 1
  Filled 2014-06-08 (×4): qty 2

## 2014-06-08 MED ORDER — MIDAZOLAM HCL 5 MG/5ML IJ SOLN
INTRAMUSCULAR | Status: DC | PRN
  Administered 2014-06-08: 3 mg via INTRAVENOUS
  Administered 2014-06-08: 2 mg via INTRAVENOUS
  Administered 2014-06-08: 5 mg via INTRAVENOUS

## 2014-06-08 MED ORDER — SODIUM CHLORIDE 0.9 % IV SOLN
INTRAVENOUS | Status: DC
  Filled 2014-06-08: qty 40

## 2014-06-08 MED ORDER — PROPOFOL 10 MG/ML IV BOLUS
INTRAVENOUS | Status: DC | PRN
  Administered 2014-06-08: 30 mg via INTRAVENOUS

## 2014-06-08 MED ORDER — ASPIRIN 81 MG PO CHEW: 324.0000 mg | CHEWABLE_TABLET | Freq: Every day | ORAL | Status: DC

## 2014-06-08 MED ORDER — ACETAMINOPHEN 160 MG/5ML PO SOLN: 650.0000 mg | Freq: Once | ORAL | Status: AC

## 2014-06-08 MED ORDER — LIDOCAINE HCL (CARDIAC) 20 MG/ML IV SOLN
INTRAVENOUS | Status: AC
  Filled 2014-06-08: qty 5

## 2014-06-08 MED ORDER — PHENYLEPHRINE HCL 10 MG/ML IJ SOLN
20.0000 mg | INTRAVENOUS | Status: DC | PRN
  Administered 2014-06-08: 10 ug/min via INTRAVENOUS

## 2014-06-08 MED ORDER — 0.9 % SODIUM CHLORIDE (POUR BTL) OPTIME
TOPICAL | Status: DC | PRN
  Administered 2014-06-08: 1000 mL

## 2014-06-08 MED ORDER — CHLORHEXIDINE GLUCONATE 0.12 % MT SOLN
15.0000 mL | Freq: Two times a day (BID) | OROMUCOSAL | Status: DC
  Administered 2014-06-08 – 2014-06-12 (×6): 15 mL via OROMUCOSAL
  Filled 2014-06-08 (×12): qty 15

## 2014-06-08 MED ORDER — SODIUM CHLORIDE 0.9 % IJ SOLN: 3.0000 mL | INTRAMUSCULAR | Status: DC | PRN

## 2014-06-08 MED ORDER — SODIUM CHLORIDE 0.9 % IV SOLN
INTRAVENOUS | Status: DC | PRN
  Administered 2014-06-08 (×2): via INTRAVENOUS

## 2014-06-08 MED ORDER — TRAMADOL HCL 50 MG PO TABS
50.0000 mg | ORAL_TABLET | ORAL | Status: DC | PRN
  Administered 2014-06-09 (×2): 50 mg via ORAL
  Administered 2014-06-11: 100 mg via ORAL
  Administered 2014-06-11 (×2): 50 mg via ORAL
  Administered 2014-06-12: 100 mg via ORAL
  Filled 2014-06-08: qty 1
  Filled 2014-06-08 (×2): qty 2
  Filled 2014-06-08: qty 1
  Filled 2014-06-08: qty 2

## 2014-06-08 MED ORDER — METOPROLOL TARTRATE 25 MG/10 ML ORAL SUSPENSION
12.5000 mg | Freq: Two times a day (BID) | ORAL | Status: DC
  Filled 2014-06-08 (×11): qty 5

## 2014-06-08 MED ORDER — ALBUTEROL SULFATE HFA 108 (90 BASE) MCG/ACT IN AERS
INHALATION_SPRAY | RESPIRATORY_TRACT | Status: AC
  Filled 2014-06-08: qty 6.7

## 2014-06-08 MED ORDER — MORPHINE SULFATE 2 MG/ML IJ SOLN
2.0000 mg | INTRAMUSCULAR | Status: DC | PRN
  Administered 2014-06-08: 4 mg via INTRAVENOUS
  Administered 2014-06-08 (×2): 2 mg via INTRAVENOUS
  Administered 2014-06-09 (×6): 4 mg via INTRAVENOUS
  Administered 2014-06-10: 2 mg via INTRAVENOUS
  Administered 2014-06-10: 4 mg via INTRAVENOUS
  Administered 2014-06-10: 2 mg via INTRAVENOUS
  Administered 2014-06-11 – 2014-06-13 (×3): 4 mg via INTRAVENOUS
  Filled 2014-06-08 (×2): qty 1
  Filled 2014-06-08 (×5): qty 2
  Filled 2014-06-08: qty 1
  Filled 2014-06-08: qty 2
  Filled 2014-06-08 (×2): qty 1
  Filled 2014-06-08 (×3): qty 2
  Filled 2014-06-08: qty 1
  Filled 2014-06-08: qty 2

## 2014-06-08 MED ORDER — PROTAMINE SULFATE 10 MG/ML IV SOLN
INTRAVENOUS | Status: DC | PRN
Start: 2014-06-08 — End: 2014-06-08
  Administered 2014-06-08: 22 mg via INTRAVENOUS

## 2014-06-08 MED ORDER — SODIUM CHLORIDE 0.9 % IJ SOLN
3.0000 mL | Freq: Two times a day (BID) | INTRAMUSCULAR | Status: DC
Start: 2014-06-09 — End: 2014-06-13
  Administered 2014-06-09 – 2014-06-11 (×3): 3 mL via INTRAVENOUS

## 2014-06-08 MED ORDER — SODIUM CHLORIDE 0.9 % IV SOLN: 250.0000 mL | INTRAVENOUS | Status: DC

## 2014-06-08 MED ORDER — PANTOPRAZOLE SODIUM 40 MG PO TBEC
40.0000 mg | DELAYED_RELEASE_TABLET | Freq: Every day | ORAL | Status: DC
  Administered 2014-06-10 – 2014-06-13 (×4): 40 mg via ORAL
  Filled 2014-06-08 (×4): qty 1

## 2014-06-08 MED ORDER — MORPHINE SULFATE 2 MG/ML IJ SOLN
1.0000 mg | INTRAMUSCULAR | Status: AC | PRN
  Administered 2014-06-08 (×2): 2 mg via INTRAVENOUS
  Filled 2014-06-08 (×2): qty 1

## 2014-06-08 MED ORDER — NITROGLYCERIN IN D5W 200-5 MCG/ML-% IV SOLN
0.0000 ug/min | INTRAVENOUS | Status: DC
Start: 2014-06-08 — End: 2014-06-10

## 2014-06-08 MED ORDER — ALBUMIN HUMAN 5 % IV SOLN
INTRAVENOUS | Status: DC | PRN
  Administered 2014-06-08 (×2): via INTRAVENOUS

## 2014-06-08 MED ORDER — ASPIRIN EC 325 MG PO TBEC
325.0000 mg | DELAYED_RELEASE_TABLET | Freq: Every day | ORAL | Status: DC
Start: 2014-06-09 — End: 2014-06-13
  Administered 2014-06-09 – 2014-06-13 (×5): 325 mg via ORAL
  Filled 2014-06-08 (×5): qty 1

## 2014-06-08 MED ORDER — INSULIN REGULAR BOLUS VIA INFUSION
0.0000 [IU] | Freq: Three times a day (TID) | INTRAVENOUS | Status: DC
  Administered 2014-06-09: 2 [IU] via INTRAVENOUS
  Filled 2014-06-08: qty 10

## 2014-06-08 MED ORDER — LEVALBUTEROL HCL 0.63 MG/3ML IN NEBU
0.6300 mg | INHALATION_SOLUTION | Freq: Four times a day (QID) | RESPIRATORY_TRACT | Status: AC
  Administered 2014-06-08 – 2014-06-09 (×4): 0.63 mg via RESPIRATORY_TRACT
  Filled 2014-06-08 (×4): qty 3

## 2014-06-08 MED ORDER — DEXMEDETOMIDINE HCL IN NACL 400 MCG/100ML IV SOLN
0.1000 ug/kg/h | INTRAVENOUS | Status: DC
  Administered 2014-06-08: 0.7 ug/kg/h via INTRAVENOUS
  Filled 2014-06-08: qty 100

## 2014-06-08 MED ORDER — SUFENTANIL CITRATE 250 MCG/5ML IV SOLN
INTRAVENOUS | Status: AC
  Filled 2014-06-08: qty 5

## 2014-06-08 MED ORDER — ROCURONIUM BROMIDE 50 MG/5ML IV SOLN
INTRAVENOUS | Status: AC
  Filled 2014-06-08: qty 1

## 2014-06-08 MED ORDER — FAMOTIDINE IN NACL 20-0.9 MG/50ML-% IV SOLN
20.0000 mg | Freq: Two times a day (BID) | INTRAVENOUS | Status: AC
  Administered 2014-06-08 (×2): 20 mg via INTRAVENOUS
  Filled 2014-06-08: qty 50

## 2014-06-08 MED ORDER — LACTATED RINGERS IV SOLN: 500.0000 mL | Freq: Once | INTRAVENOUS | Status: AC | PRN

## 2014-06-08 MED ORDER — ACETAMINOPHEN 650 MG RE SUPP
650.0000 mg | Freq: Once | RECTAL | Status: AC
  Administered 2014-06-08: 650 mg via RECTAL

## 2014-06-08 MED ORDER — METOPROLOL TARTRATE 12.5 MG HALF TABLET
12.5000 mg | ORAL_TABLET | Freq: Two times a day (BID) | ORAL | Status: DC
  Administered 2014-06-09 – 2014-06-13 (×7): 12.5 mg via ORAL
  Filled 2014-06-08 (×11): qty 1

## 2014-06-08 MED ORDER — HEPARIN SODIUM (PORCINE) 1000 UNIT/ML IJ SOLN
INTRAMUSCULAR | Status: DC | PRN
  Administered 2014-06-08: 24000 [IU] via INTRAVENOUS

## 2014-06-08 MED ORDER — PHENYLEPHRINE HCL 10 MG/ML IJ SOLN
0.0000 ug/min | INTRAMUSCULAR | Status: DC
  Filled 2014-06-08: qty 2

## 2014-06-08 MED ORDER — MAGNESIUM SULFATE 4 GM/100ML IV SOLN
4.0000 g | Freq: Once | INTRAVENOUS | Status: AC
  Administered 2014-06-08: 4 g via INTRAVENOUS
  Filled 2014-06-08: qty 100

## 2014-06-08 MED ORDER — HEPARIN SODIUM (PORCINE) 1000 UNIT/ML IJ SOLN
INTRAMUSCULAR | Status: AC
  Filled 2014-06-08: qty 1

## 2014-06-08 MED ORDER — SODIUM CHLORIDE 0.9 % IV SOLN
INTRAVENOUS | Status: DC
Start: 2014-06-08 — End: 2014-06-13
  Administered 2014-06-08: 20 mL via INTRAVENOUS

## 2014-06-08 MED ORDER — PROTAMINE SULFATE 10 MG/ML IV SOLN
INTRAVENOUS | Status: AC
  Filled 2014-06-08: qty 25

## 2014-06-08 MED ORDER — HEMOSTATIC AGENTS (NO CHARGE) OPTIME
TOPICAL | Status: DC | PRN
  Administered 2014-06-08: 1 via TOPICAL

## 2014-06-08 MED ORDER — BISACODYL 5 MG PO TBEC
10.0000 mg | DELAYED_RELEASE_TABLET | Freq: Every day | ORAL | Status: DC
  Administered 2014-06-09 – 2014-06-11 (×3): 10 mg via ORAL
  Filled 2014-06-08 (×4): qty 2

## 2014-06-08 MED ORDER — PROPOFOL 10 MG/ML IV BOLUS
INTRAVENOUS | Status: AC
  Filled 2014-06-08: qty 20

## 2014-06-08 MED ORDER — MIDAZOLAM HCL 2 MG/2ML IJ SOLN: 2.0000 mg | INTRAMUSCULAR | Status: DC | PRN

## 2014-06-08 MED ORDER — SODIUM CHLORIDE 0.9 % IV SOLN
INTRAVENOUS | Status: DC
  Administered 2014-06-08: 0.6 [IU]/h via INTRAVENOUS
  Filled 2014-06-08 (×2): qty 2.5

## 2014-06-08 MED ORDER — SODIUM CHLORIDE 0.9 % IJ SOLN
OROMUCOSAL | Status: DC | PRN
  Administered 2014-06-08: 1 mL via TOPICAL

## 2014-06-08 MED ORDER — MIDAZOLAM HCL 10 MG/2ML IJ SOLN
INTRAMUSCULAR | Status: AC
  Filled 2014-06-08: qty 2

## 2014-06-08 MED ORDER — LIDOCAINE HCL (CARDIAC) 20 MG/ML IV SOLN
INTRAVENOUS | Status: DC | PRN
  Administered 2014-06-08: 100 mg via INTRAVENOUS

## 2014-06-08 MED ORDER — ACETAMINOPHEN 160 MG/5ML PO SOLN
1000.0000 mg | Freq: Four times a day (QID) | ORAL | Status: DC
  Filled 2014-06-08: qty 40

## 2014-06-08 MED ORDER — SUFENTANIL CITRATE 50 MCG/ML IV SOLN
INTRAVENOUS | Status: DC | PRN
  Administered 2014-06-08: 20 ug via INTRAVENOUS
  Administered 2014-06-08: 10 ug via INTRAVENOUS
  Administered 2014-06-08 (×2): 20 ug via INTRAVENOUS
  Administered 2014-06-08: 50 ug via INTRAVENOUS
  Administered 2014-06-08: 20 ug via INTRAVENOUS
  Administered 2014-06-08: 50 ug via INTRAVENOUS
  Administered 2014-06-08 (×3): 20 ug via INTRAVENOUS

## 2014-06-08 MED ORDER — EPHEDRINE SULFATE 50 MG/ML IJ SOLN
INTRAMUSCULAR | Status: AC
  Filled 2014-06-08: qty 1

## 2014-06-08 MED ORDER — VANCOMYCIN HCL IN DEXTROSE 1-5 GM/200ML-% IV SOLN
1000.0000 mg | Freq: Once | INTRAVENOUS | Status: AC
  Administered 2014-06-08: 1000 mg via INTRAVENOUS
  Filled 2014-06-08: qty 200

## 2014-06-08 MED ORDER — SODIUM CHLORIDE 0.45 % IV SOLN
INTRAVENOUS | Status: DC
  Administered 2014-06-08: 20 mL via INTRAVENOUS

## 2014-06-08 MED ORDER — ONDANSETRON HCL 4 MG/2ML IJ SOLN: 4.0000 mg | Freq: Four times a day (QID) | INTRAMUSCULAR | Status: DC | PRN

## 2014-06-08 MED ORDER — LACTATED RINGERS IV SOLN
INTRAVENOUS | Status: DC
  Administered 2014-06-08: 20 mL/h via INTRAVENOUS

## 2014-06-08 MED ORDER — BISACODYL 10 MG RE SUPP: 10.0000 mg | Freq: Every day | RECTAL | Status: DC

## 2014-06-08 MED ORDER — METOPROLOL TARTRATE 1 MG/ML IV SOLN: 2.5000 mg | INTRAVENOUS | Status: DC | PRN

## 2014-06-08 MED ORDER — ACETAMINOPHEN 500 MG PO TABS
1000.0000 mg | ORAL_TABLET | Freq: Four times a day (QID) | ORAL | Status: DC
  Administered 2014-06-09 – 2014-06-13 (×12): 1000 mg via ORAL
  Filled 2014-06-08 (×20): qty 2

## 2014-06-08 MED ORDER — ALBUTEROL SULFATE HFA 108 (90 BASE) MCG/ACT IN AERS
INHALATION_SPRAY | RESPIRATORY_TRACT | Status: DC | PRN
  Administered 2014-06-08: 3 via RESPIRATORY_TRACT

## 2014-06-08 MED ORDER — ROCURONIUM BROMIDE 50 MG/5ML IV SOLN
INTRAVENOUS | Status: AC
  Filled 2014-06-08: qty 2

## 2014-06-08 MED ORDER — POTASSIUM CHLORIDE 10 MEQ/50ML IV SOLN: 10.0000 meq | INTRAVENOUS | Status: AC

## 2014-06-08 MED ORDER — DEXTROSE 5 % IV SOLN
1.5000 g | Freq: Two times a day (BID) | INTRAVENOUS | Status: AC
  Administered 2014-06-08 – 2014-06-10 (×4): 1.5 g via INTRAVENOUS
  Filled 2014-06-08 (×4): qty 1.5

## 2014-06-08 MED ORDER — CETYLPYRIDINIUM CHLORIDE 0.05 % MT LIQD
7.0000 mL | Freq: Four times a day (QID) | OROMUCOSAL | Status: DC
  Administered 2014-06-09 – 2014-06-13 (×10): 7 mL via OROMUCOSAL

## 2014-06-08 MED ORDER — ALBUMIN HUMAN 5 % IV SOLN
250.0000 mL | INTRAVENOUS | Status: AC | PRN
  Administered 2014-06-08 (×3): 250 mL via INTRAVENOUS
  Filled 2014-06-08: qty 250

## 2014-06-08 MED ORDER — DEXMEDETOMIDINE HCL IN NACL 200 MCG/50ML IV SOLN: 0.0000 ug/kg/h | INTRAVENOUS | Status: DC

## 2014-06-08 MED ORDER — DOCUSATE SODIUM 100 MG PO CAPS
200.0000 mg | ORAL_CAPSULE | Freq: Every day | ORAL | Status: DC
  Administered 2014-06-09 – 2014-06-13 (×5): 200 mg via ORAL
  Filled 2014-06-08 (×5): qty 2

## 2014-06-08 SURGICAL SUPPLY — 73 items
BAG DECANTER FOR FLEXI CONT (MISCELLANEOUS) ×3 IMPLANT
BANDAGE ELASTIC 4 VELCRO ST LF (GAUZE/BANDAGES/DRESSINGS) ×6 IMPLANT
BANDAGE ELASTIC 6 VELCRO ST LF (GAUZE/BANDAGES/DRESSINGS) ×6 IMPLANT
BLADE STERNUM SYSTEM 6 (BLADE) ×3 IMPLANT
BLADE SURG 11 STRL SS (BLADE) ×3 IMPLANT
BNDG GAUZE ELAST 4 BULKY (GAUZE/BANDAGES/DRESSINGS) ×6 IMPLANT
CANISTER SUCTION 2500CC (MISCELLANEOUS) ×3 IMPLANT
CATH CPB KIT GERHARDT (MISCELLANEOUS) ×3 IMPLANT
CATH THORACIC 28FR (CATHETERS) ×3 IMPLANT
CLIP FOGARTY SPRING 6M (CLIP) ×3 IMPLANT
CLIP RETRACTION 3.0MM CORONARY (MISCELLANEOUS) ×3 IMPLANT
CLIP TI WIDE RED SMALL 24 (CLIP) ×12 IMPLANT
CRADLE DONUT ADULT HEAD (MISCELLANEOUS) ×3 IMPLANT
DERMABOND ADHESIVE PROPEN (GAUZE/BANDAGES/DRESSINGS) ×2
DERMABOND ADVANCED .7 DNX6 (GAUZE/BANDAGES/DRESSINGS) ×4 IMPLANT
DRAIN CHANNEL 28F RND 3/8 FF (WOUND CARE) ×3 IMPLANT
DRAPE CARDIOVASCULAR INCISE (DRAPES) ×1
DRAPE SLUSH/WARMER DISC (DRAPES) ×3 IMPLANT
DRAPE SRG 135X102X78XABS (DRAPES) ×2 IMPLANT
DRSG AQUACEL AG ADV 3.5X14 (GAUZE/BANDAGES/DRESSINGS) ×3 IMPLANT
ELECT BLADE 4.0 EZ CLEAN MEGAD (MISCELLANEOUS) ×3
ELECT REM PT RETURN 9FT ADLT (ELECTROSURGICAL) ×6
ELECTRODE BLDE 4.0 EZ CLN MEGD (MISCELLANEOUS) ×2 IMPLANT
ELECTRODE REM PT RTRN 9FT ADLT (ELECTROSURGICAL) ×4 IMPLANT
GAUZE SPONGE 4X4 12PLY STRL (GAUZE/BANDAGES/DRESSINGS) ×6 IMPLANT
GLOVE BIO SURGEON STRL SZ 6.5 (GLOVE) ×18 IMPLANT
GLOVE BIOGEL M STRL SZ7.5 (GLOVE) ×3 IMPLANT
GLOVE BIOGEL PI IND STRL 6 (GLOVE) ×2 IMPLANT
GLOVE BIOGEL PI IND STRL 7.0 (GLOVE) ×14 IMPLANT
GLOVE BIOGEL PI INDICATOR 6 (GLOVE) ×1
GLOVE BIOGEL PI INDICATOR 7.0 (GLOVE) ×7
GOWN STRL REUS W/ TWL LRG LVL3 (GOWN DISPOSABLE) ×14 IMPLANT
GOWN STRL REUS W/TWL LRG LVL3 (GOWN DISPOSABLE) ×7
HEMOSTAT POWDER SURGIFOAM 1G (HEMOSTASIS) ×9 IMPLANT
HEMOSTAT SURGICEL 2X14 (HEMOSTASIS) ×3 IMPLANT
KIT BASIN OR (CUSTOM PROCEDURE TRAY) ×3 IMPLANT
KIT CATH SUCT 8FR (CATHETERS) ×3 IMPLANT
KIT ROOM TURNOVER OR (KITS) ×3 IMPLANT
KIT SUCTION CATH 14FR (SUCTIONS) ×6 IMPLANT
KIT VASOVIEW W/TROCAR VH 2000 (KITS) ×3 IMPLANT
LEAD PACING MYOCARDI (MISCELLANEOUS) ×3 IMPLANT
MARKER GRAFT CORONARY BYPASS (MISCELLANEOUS) ×12 IMPLANT
NS IRRIG 1000ML POUR BTL (IV SOLUTION) ×15 IMPLANT
PACK OPEN HEART (CUSTOM PROCEDURE TRAY) ×3 IMPLANT
PAD ARMBOARD 7.5X6 YLW CONV (MISCELLANEOUS) ×6 IMPLANT
PAD ELECT DEFIB RADIOL ZOLL (MISCELLANEOUS) ×3 IMPLANT
PENCIL BUTTON HOLSTER BLD 10FT (ELECTRODE) ×3 IMPLANT
PUNCH AORTIC ROTATE  4.5MM 8IN (MISCELLANEOUS) ×3 IMPLANT
SET CARDIOPLEGIA MPS 5001102 (MISCELLANEOUS) ×3 IMPLANT
SPONGE GAUZE 4X4 12PLY STER LF (GAUZE/BANDAGES/DRESSINGS) ×9 IMPLANT
SUT BONE WAX W31G (SUTURE) ×3 IMPLANT
SUT MNCRL AB 4-0 PS2 18 (SUTURE) ×3 IMPLANT
SUT PROLENE 3 0 SH1 36 (SUTURE) ×3 IMPLANT
SUT PROLENE 4 0 TF (SUTURE) ×6 IMPLANT
SUT PROLENE 6 0 CC (SUTURE) ×12 IMPLANT
SUT PROLENE 7 0 BV 1 (SUTURE) ×3 IMPLANT
SUT PROLENE 7 0 BV1 MDA (SUTURE) ×3 IMPLANT
SUT PROLENE 8 0 BV175 6 (SUTURE) ×9 IMPLANT
SUT SILK 2 0 SH CR/8 (SUTURE) ×3 IMPLANT
SUT STEEL 6MS V (SUTURE) ×3 IMPLANT
SUT STEEL SZ 6 DBL 3X14 BALL (SUTURE) ×3 IMPLANT
SUT VIC AB 1 CTX 18 (SUTURE) ×6 IMPLANT
SUT VIC AB 2-0 CT1 27 (SUTURE) ×1
SUT VIC AB 2-0 CT1 TAPERPNT 27 (SUTURE) ×2 IMPLANT
SUTURE E-PAK OPEN HEART (SUTURE) ×3 IMPLANT
SYSTEM SAHARA CHEST DRAIN ATS (WOUND CARE) ×3 IMPLANT
TAPE CLOTH SURG 4X10 WHT LF (GAUZE/BANDAGES/DRESSINGS) ×9 IMPLANT
TOWEL OR 17X24 6PK STRL BLUE (TOWEL DISPOSABLE) ×6 IMPLANT
TOWEL OR 17X26 10 PK STRL BLUE (TOWEL DISPOSABLE) ×6 IMPLANT
TRAY FOLEY IC TEMP SENS 16FR (CATHETERS) ×3 IMPLANT
TUBING INSUFFLATION (TUBING) ×3 IMPLANT
UNDERPAD 30X30 INCONTINENT (UNDERPADS AND DIAPERS) ×3 IMPLANT
WATER STERILE IRR 1000ML POUR (IV SOLUTION) ×6 IMPLANT

## 2014-06-08 NOTE — Progress Notes (Signed)
Patient armband unable to scan for blood glucose. 1300: 138, 1400: 130, 1500: 107, 1600: 99, 1700: 101, 1800: 100. See MAR for insulin dose changes.Nitro drip and precedex drip stopped @ 1730. Patient extubated @ 1805.

## 2014-06-08 NOTE — Progress Notes (Signed)
  Echocardiogram 2D Echocardiogram has been performed.  Melinda [name] 06/08/2014, 8:24 AM

## 2014-06-08 NOTE — Procedures (Signed)
Extubation Procedure Note  Patient Details:   Name: Melinda [name] DOB: 10/13/1955 MRN: [id number]   Airway Documentation:  AIRWAYS 8 mm (Active)  Secured at (cm) 23 cm 06/08/2014 12:00 AM    Evaluation  O2 sats: stable throughout Complications: No apparent complications Patient did tolerate procedure well. Bilateral Breath Sounds: Clear   Yes Patient had good parameters prior to extubation NIF -38cmh2o, FVC 900cc, RR 24, Sat 100%, audible cuff leak as well.  Extubated to 4 lpm [name] patient able to state full name, good cough with splint of pillow and ask for pain medicine.  Tol well.  Vaughan Basta Gregory 06/08/2014, 6:14 PM

## 2014-06-08 NOTE — Progress Notes (Signed)
Notified RT that patient is ready for wean. Unable to come to room. Currently in another procedure.

## 2014-06-08 NOTE — OR Nursing (Signed)
3291 chest incision made

## 2014-06-08 NOTE — Progress Notes (Signed)
Family called update given.

## 2014-06-08 NOTE — OR Nursing (Signed)
SICU first call @ 1205

## 2014-06-08 NOTE — Anesthesia Postprocedure Evaluation (Signed)
  Anesthesia Post-op Note  Patient: Melinda [name]  Procedure(s) Performed: Procedure(s) with comments: CORONARY ARTERY BYPASS GRAFTING (CABG) (N/A) - Times 4 using left internal mammary artery to LAD artery and endoscopically harvested bilateral saphenous vein to Obtuse Marginal, Diagonal and Posterior Descending coronary arteries. TRANSESOPHAGEAL ECHOCARDIOGRAM (TEE) (N/A)  Patient Location: ICU  Anesthesia Type:General  Level of Consciousness: Patient remains intubated per anesthesia plan  Airway and Oxygen Therapy: Patient remains intubated per anesthesia plan  Post-op Pain: none  Post-op Assessment: Post-op Vital signs reviewed  Post-op Vital Signs: Reviewed  Last Vitals:  Filed Vitals:   06/08/14 1500  BP: 128/76  Pulse: 90  Temp: 36.1 C  Resp:     Complications: No apparent anesthesia complications

## 2014-06-08 NOTE — Progress Notes (Signed)
Patient ID: Melinda [name], female   DOB: 1955-11-13, 59 y.o.   MRN: [id number] EVENING ROUNDS NOTE :     [address]       [place] [address]             [phone]                 Day of Surgery Procedure(s) (LRB): CORONARY ARTERY BYPASS GRAFTING (CABG) (N/A) TRANSESOPHAGEAL ECHOCARDIOGRAM (TEE) (N/A)  Total Length of Stay:  LOS: 5 days  BP 102/63 mmHg  Pulse 90  Temp(Src) 97.5 F (36.4 C) (Core (Comment))  Resp 25  Ht 5\' 8"  (1.727 m)  Wt 243 lb 11.2 oz (110.542 kg)  BMI 37.06 kg/m2  SpO2 100%  .Intake/Output      02/07 0701 - 02/08 0700 02/08 0701 - 02/09 0700   P.O. 720    I.V. (mL/kg)  2920.9 (26.4)   Blood  385   IV Piggyback  1700   Total Intake(mL/kg) 720 (6.5) 5005.9 (45.3)   Urine (mL/kg/hr)  3050 (2.4)   Blood  600 (0.5)   Chest Tube  99 (0.1)   Total Output   3749   Net +720 +1256.9        Urine Occurrence 4 x      . sodium chloride 20 mL (06/08/14 1315)  . [START ON 06/09/2014] sodium chloride    . sodium chloride 20 mL (06/08/14 1402)  . dexmedetomidine 0.2 mcg/kg/hr (06/08/14 1708)  . insulin (NOVOLIN-R) infusion 0.4 Units/hr (06/08/14 1800)  . lactated ringers 20 mL/hr (06/08/14 1315)  . nitroGLYCERIN Stopped (06/08/14 1711)  . phenylephrine (NEO-SYNEPHRINE) Adult infusion Stopped (06/08/14 1301)     Lab Results  Component Value Date   WBC 11.9* 06/08/2014   HGB 9.9* 06/08/2014   HCT 29.0* 06/08/2014   PLT 208 06/08/2014   GLUCOSE 138* 06/08/2014   ALT 25 06/05/2014   AST 29 06/05/2014   NA 144 06/08/2014   K 4.2 06/08/2014   CL 106 06/08/2014   CREATININE 0.90 06/08/2014   BUN 10 06/08/2014   CO2 29 06/07/2014   TSH 1.002 06/04/2014   INR 1.28 06/08/2014   HGBA1C 10.5* 06/07/2014   Now extubated  Not bleeding   Grace Isaac MD  Beeper [phone] Office [phone] 06/08/2014 6:39 PM

## 2014-06-08 NOTE — Transfer of Care (Signed)
Immediate Anesthesia Transfer of Care Note  Patient: Melinda [name]  Procedure(s) Performed: Procedure(s) with comments: CORONARY ARTERY BYPASS GRAFTING (CABG) (N/A) - Times 4 using left internal mammary artery to LAD artery and endoscopically harvested bilateral saphenous vein to Obtuse Marginal, Diagonal and Posterior Descending coronary arteries. TRANSESOPHAGEAL ECHOCARDIOGRAM (TEE) (N/A)  Patient Location: ICU  Anesthesia Type:General  Level of Consciousness: sedated and Patient remains intubated per anesthesia plan  Airway & Oxygen Therapy: Patient remains intubated per anesthesia plan and Patient placed on Ventilator (see vital sign flow sheet for setting)  Post-op Assessment: Report given to RN and Post -op Vital signs reviewed and stable  Post vital signs: Reviewed and stable  Last Vitals:  Filed Vitals:   06/08/14 0542  BP: 107/54  Pulse: 67  Temp:   Resp:     Complications: No apparent anesthesia complications

## 2014-06-08 NOTE — Brief Op Note (Addendum)
      [address]       [place] [address]             [phone]      06/08/2014  11:12 AM  PATIENT:  Melinda [name]  59 y.o. female  PRE-OPERATIVE DIAGNOSIS:  1. S/p NSTEMI 2. Multivessel CAD  POST-OPERATIVE DIAGNOSIS:   1. S/p NSTEMI 2. Multivessel CAD   PROCEDURE:  TRANSESOPHAGEAL ECHOCARDIOGRAM (TEE), MEDIAN STERNOTOMY for CORONARY ARTERY BYPASS GRAFTING (CABG) x 4 using left internal mammary artery to LAD artery and endoscopically harvested bilateral saphenous vein to OM1, Diagonal, and Posterior Descending arteries.  SURGEON:  Surgeon(s) and Role:    * Grace Isaac, MD - Primary  PHYSICIAN ASSISTANT: Lars Pinks [name]  ANESTHESIA:   general  EBL:  Total I/O In: -  Out: 1200 [Urine:1200]  DRAINS: Chest tubes placed in the mediastinal and pleural spaces   COUNTS CORRECT:  YES  DICTATION: .Dragon Dictation  PLAN OF CARE: Admit to inpatient   PATIENT DISPOSITION:  ICU - intubated and hemodynamically stable.   Delay start of Pharmacological VTE agent (>24hrs) due to surgical blood loss or risk of bleeding: yes  BASELINE WEIGHT: 110 kg

## 2014-06-08 NOTE — Anesthesia Preprocedure Evaluation (Addendum)
Anesthesia Evaluation  Patient identified by MRN, date of birth, ID band Patient awake    Reviewed: Allergy & Precautions, NPO status , Patient's Chart, lab work & pertinent test results  Airway Mallampati: III  TM Distance: >3 FB Neck ROM: Full    Dental  (+) Edentulous Upper   Pulmonary Current Smoker,          Cardiovascular hypertension, + CAD and + Past MI  06/06/14 TTE: EF 40-45%. Severe 3 vessel disease on recent cath.   Neuro/Psych negative neurological ROS     GI/Hepatic Neg liver ROS, GERD-  ,  Endo/Other  diabetes, Poorly Controlled, Type 2Morbid obesity  Renal/GU Renal InsufficiencyRenal disease     Musculoskeletal   Abdominal   Peds  Hematology  (+) anemia ,   Anesthesia Other Findings   Reproductive/Obstetrics                            Anesthesia Physical Anesthesia Plan  ASA: IV  Anesthesia Plan: General   Post-op Pain Management:    Induction: Intravenous  Airway Management Planned: Oral ETT  Additional Equipment: TEE, CVP, PA Cath, Arterial line and Ultrasound Guidance Line Placement  Intra-op Plan:   Post-operative Plan: Post-operative intubation/ventilation  Informed Consent: I have reviewed the patients History and Physical, chart, labs and discussed the procedure including the risks, benefits and alternatives for the proposed anesthesia with the patient or authorized representative who has indicated his/her understanding and acceptance.   Dental advisory given  Plan Discussed with: CRNA  Anesthesia Plan Comments:         Anesthesia Quick Evaluation

## 2014-06-09 ENCOUNTER — Encounter (HOSPITAL_COMMUNITY): Payer: Self-pay | Admitting: Cardiothoracic Surgery

## 2014-06-09 ENCOUNTER — Inpatient Hospital Stay (HOSPITAL_COMMUNITY): Payer: Medicaid Other

## 2014-06-09 LAB — GLUCOSE, CAPILLARY
Glucose-Capillary: 100 mg/dL — ABNORMAL HIGH (ref 70–99)
Glucose-Capillary: 100 mg/dL — ABNORMAL HIGH (ref 70–99)
Glucose-Capillary: 101 mg/dL — ABNORMAL HIGH (ref 70–99)
Glucose-Capillary: 103 mg/dL — ABNORMAL HIGH (ref 70–99)
Glucose-Capillary: 107 mg/dL — ABNORMAL HIGH (ref 70–99)
Glucose-Capillary: 109 mg/dL — ABNORMAL HIGH (ref 70–99)
Glucose-Capillary: 111 mg/dL — ABNORMAL HIGH (ref 70–99)
Glucose-Capillary: 112 mg/dL — ABNORMAL HIGH (ref 70–99)
Glucose-Capillary: 115 mg/dL — ABNORMAL HIGH (ref 70–99)
Glucose-Capillary: 120 mg/dL — ABNORMAL HIGH (ref 70–99)
Glucose-Capillary: 125 mg/dL — ABNORMAL HIGH (ref 70–99)
Glucose-Capillary: 127 mg/dL — ABNORMAL HIGH (ref 70–99)
Glucose-Capillary: 128 mg/dL — ABNORMAL HIGH (ref 70–99)
Glucose-Capillary: 130 mg/dL — ABNORMAL HIGH (ref 70–99)
Glucose-Capillary: 136 mg/dL — ABNORMAL HIGH (ref 70–99)
Glucose-Capillary: 139 mg/dL — ABNORMAL HIGH (ref 70–99)
Glucose-Capillary: 181 mg/dL — ABNORMAL HIGH (ref 70–99)
Glucose-Capillary: 99 mg/dL (ref 70–99)

## 2014-06-09 LAB — BASIC METABOLIC PANEL
Anion gap: 4 — ABNORMAL LOW (ref 5–15)
BUN: 8 mg/dL (ref 6–23)
CO2: 26 mmol/L (ref 19–32)
Calcium: 8.1 mg/dL — ABNORMAL LOW (ref 8.4–10.5)
Chloride: 114 mmol/L — ABNORMAL HIGH (ref 96–112)
Creatinine, Ser: 0.9 mg/dL (ref 0.50–1.10)
GFR calc Af Amer: 79 mL/min — ABNORMAL LOW (ref >90)
GFR calc non Af Amer: 68 mL/min — ABNORMAL LOW (ref >90)
Glucose, Bld: 116 mg/dL — ABNORMAL HIGH (ref 70–99)
Potassium: 4 mmol/L (ref 3.5–5.1)
Sodium: 144 mmol/L (ref 135–145)

## 2014-06-09 LAB — CBC
HCT: 25.9 % — ABNORMAL LOW (ref 36.0–46.0)
HCT: 25.6 % — ABNORMAL LOW (ref 36.0–46.0)
Hemoglobin: 8.5 g/dL — ABNORMAL LOW (ref 12.0–15.0)
Hemoglobin: 8.2 g/dL — ABNORMAL LOW (ref 12.0–15.0)
MCH: 24.5 pg — ABNORMAL LOW (ref 26.0–34.0)
MCH: 24.2 pg — ABNORMAL LOW (ref 26.0–34.0)
MCHC: 32.8 g/dL (ref 30.0–36.0)
MCHC: 32 g/dL (ref 30.0–36.0)
MCV: 74.6 fL — ABNORMAL LOW (ref 78.0–100.0)
MCV: 75.5 fL — ABNORMAL LOW (ref 78.0–100.0)
Platelets: 239 10*3/uL (ref 150–400)
Platelets: 234 10*3/uL (ref 150–400)
RBC: 3.47 MIL/uL — ABNORMAL LOW (ref 3.87–5.11)
RBC: 3.39 MIL/uL — ABNORMAL LOW (ref 3.87–5.11)
RDW: 15.2 % (ref 11.5–15.5)
RDW: 15.4 % (ref 11.5–15.5)
WBC: 11.4 10*3/uL — ABNORMAL HIGH (ref 4.0–10.5)
WBC: 12.3 10*3/uL — ABNORMAL HIGH (ref 4.0–10.5)

## 2014-06-09 LAB — POCT I-STAT, CHEM 8
BUN: 10 mg/dL (ref 6–23)
Calcium, Ion: 1.19 mmol/L (ref 1.13–1.30)
Chloride: 104 mmol/L (ref 96–112)
Creatinine, Ser: 1.1 mg/dL (ref 0.50–1.10)
Glucose, Bld: 154 mg/dL — ABNORMAL HIGH (ref 70–99)
HCT: 26 % — ABNORMAL LOW (ref 36.0–46.0)
Hemoglobin: 8.8 g/dL — ABNORMAL LOW (ref 12.0–15.0)
Potassium: 4.2 mmol/L (ref 3.5–5.1)
Sodium: 140 mmol/L (ref 135–145)
TCO2: 22 mmol/L (ref 0–100)

## 2014-06-09 LAB — MAGNESIUM
Magnesium: 2.4 mg/dL (ref 1.5–2.5)
Magnesium: 2.3 mg/dL (ref 1.5–2.5)

## 2014-06-09 LAB — CREATININE, SERUM
Creatinine, Ser: 1.19 mg/dL — ABNORMAL HIGH (ref 0.50–1.10)
GFR calc Af Amer: 56 mL/min — ABNORMAL LOW (ref >90)
GFR calc non Af Amer: 49 mL/min — ABNORMAL LOW (ref >90)

## 2014-06-09 MED ORDER — INSULIN DETEMIR 100 UNIT/ML ~~LOC~~ SOLN
8.0000 [IU] | Freq: Once | SUBCUTANEOUS | Status: DC
  Filled 2014-06-09: qty 0.08

## 2014-06-09 MED ORDER — INSULIN ASPART 100 UNIT/ML ~~LOC~~ SOLN
0.0000 [IU] | SUBCUTANEOUS | Status: DC
  Administered 2014-06-09 (×2): 2 [IU] via SUBCUTANEOUS
  Administered 2014-06-10: 4 [IU] via SUBCUTANEOUS
  Administered 2014-06-10 (×2): 2 [IU] via SUBCUTANEOUS

## 2014-06-09 MED ORDER — INSULIN DETEMIR 100 UNIT/ML ~~LOC~~ SOLN
15.0000 [IU] | Freq: Every day | SUBCUTANEOUS | Status: DC
  Administered 2014-06-10 – 2014-06-13 (×4): 15 [IU] via SUBCUTANEOUS
  Filled 2014-06-09 (×4): qty 0.15

## 2014-06-09 MED ORDER — FUROSEMIDE 10 MG/ML IJ SOLN
20.0000 mg | Freq: Two times a day (BID) | INTRAMUSCULAR | Status: AC
  Administered 2014-06-09 (×2): 20 mg via INTRAVENOUS
  Filled 2014-06-09 (×2): qty 2

## 2014-06-09 MED ORDER — INSULIN DETEMIR 100 UNIT/ML ~~LOC~~ SOLN
15.0000 [IU] | Freq: Once | SUBCUTANEOUS | Status: AC
  Administered 2014-06-09: 15 [IU] via SUBCUTANEOUS
  Filled 2014-06-09: qty 0.15

## 2014-06-09 MED ORDER — ENOXAPARIN SODIUM 40 MG/0.4ML ~~LOC~~ SOLN
40.0000 mg | SUBCUTANEOUS | Status: DC
  Administered 2014-06-09 – 2014-06-12 (×4): 40 mg via SUBCUTANEOUS
  Filled 2014-06-09 (×5): qty 0.4

## 2014-06-09 MED ORDER — INSULIN DETEMIR 100 UNIT/ML ~~LOC~~ SOLN: 8.0000 [IU] | Freq: Every day | SUBCUTANEOUS | Status: DC

## 2014-06-09 MED ORDER — KETOROLAC TROMETHAMINE 15 MG/ML IJ SOLN
15.0000 mg | Freq: Three times a day (TID) | INTRAMUSCULAR | Status: AC
  Administered 2014-06-09 – 2014-06-10 (×3): 15 mg via INTRAVENOUS
  Filled 2014-06-09 (×3): qty 1

## 2014-06-09 MED ORDER — POTASSIUM CHLORIDE CRYS ER 20 MEQ PO TBCR
20.0000 meq | EXTENDED_RELEASE_TABLET | Freq: Once | ORAL | Status: AC
  Administered 2014-06-09: 20 meq via ORAL
  Filled 2014-06-09: qty 1

## 2014-06-09 MED FILL — Sodium Bicarbonate IV Soln 8.4%: INTRAVENOUS | Qty: 50 | Status: AC

## 2014-06-09 MED FILL — Lidocaine HCl IV Inj 20 MG/ML: INTRAVENOUS | Qty: 5 | Status: AC

## 2014-06-09 MED FILL — Magnesium Sulfate Inj 50%: INTRAMUSCULAR | Qty: 10 | Status: AC

## 2014-06-09 MED FILL — Potassium Chloride Inj 2 mEq/ML: INTRAVENOUS | Qty: 40 | Status: AC

## 2014-06-09 MED FILL — Heparin Sodium (Porcine) Inj 1000 Unit/ML: INTRAMUSCULAR | Qty: 30 | Status: AC

## 2014-06-09 MED FILL — Electrolyte-R (PH 7.4) Solution: INTRAVENOUS | Qty: 3000 | Status: AC

## 2014-06-09 MED FILL — Sodium Chloride IV Soln 0.9%: INTRAVENOUS | Qty: 2000 | Status: AC

## 2014-06-09 MED FILL — Mannitol IV Soln 20%: INTRAVENOUS | Qty: 500 | Status: AC

## 2014-06-09 MED FILL — Heparin Sodium (Porcine) Inj 1000 Unit/ML: INTRAMUSCULAR | Qty: 20 | Status: AC

## 2014-06-09 NOTE — Progress Notes (Signed)
Dressing to right IJ. Tolerated well. Currently up to chair. Foley remains in place draining clear yellow urine. Will cont. To monitor.

## 2014-06-09 NOTE — Progress Notes (Signed)
Patient ID: Melinda [name], female   DOB: Jun 10, 1955, 59 y.o.   MRN: [id number]  SICU Evening Rounds:  Hemodynamically stable   Urine output good  Up in chair  Ambulated today.

## 2014-06-09 NOTE — Progress Notes (Addendum)
T[facility] DAILY ICU PROGRESS NOTE                   [address]            [place] [address]          [phone]   1 Day Post-Op Procedure(s) (LRB): CORONARY ARTERY BYPASS GRAFTING (CABG) (N/A) TRANSESOPHAGEAL ECHOCARDIOGRAM (TEE) (N/A)  Total Length of Stay:  LOS: 6 days   Subjective: Patient is somewhat sleepy this am, but easily arousable.  Objective: Vital signs in last 24 hours: Temp:  [96.8 F (36 C)-98.1 F (36.7 C)] 97.2 F (36.2 C) (02/09 0600) Pulse Rate:  [80-90] 90 (02/09 0600) Cardiac Rhythm:  [-] Atrial paced (02/09 0400) Resp:  [16-25] 25 (02/08 1600) BP: (90-128)/(60-89) 107/63 mmHg (02/09 0600) SpO2:  [94 %-100 %] 96 % (02/09 0600) Arterial Line BP: (98-141)/(44-65) 125/61 mmHg (02/09 0600) FiO2 (%):  [40 %-60 %] 40 % (02/08 1731) Weight:  [250 lb 14.1 oz (113.8 kg)] 250 lb 14.1 oz (113.8 kg) (02/09 0500)  Filed Weights   06/07/14 0451 06/08/14 0422 06/09/14 0500  Weight: 245 lb 1.6 oz (111.177 kg) 243 lb 11.2 oz (110.542 kg) 250 lb 14.1 oz (113.8 kg)    Weight change: 7 lb 2.9 oz (3.259 kg)   Hemodynamic parameters for last 24 hours: PAP: (21-44)/(7-23) 36/19 mmHg CO:  [4.1 L/min-5.7 L/min] 5.7 L/min CI:  [1.9 L/min/m2-2.7 L/min/m2] 2.7 L/min/m2  Intake/Output from previous day: 02/08 0701 - 02/09 0700 In: 5815.9 [P.O.:120; I.V.:3510.9; Blood:385; IV Piggyback:1800] Out: 9381 [Urine:4290; Blood:600; Chest Tube:279]     Current Meds: Scheduled Meds: . acetaminophen  1,000 mg Oral 4 times per day   Or  . acetaminophen (TYLENOL) oral liquid 160 mg/5 mL  1,000 mg Per Tube 4 times per day  . antiseptic oral rinse  7 mL Mouth Rinse QID  . aspirin EC  325 mg Oral Daily   Or  . aspirin  324 mg Per Tube Daily  . atorvastatin  80 mg Oral q1800  . bisacodyl  10 mg Oral Daily   Or  . bisacodyl  10 mg Rectal Daily  . cefUROXime (ZINACEF)  IV  1.5 g Intravenous Q12H  . chlorhexidine  15 mL Mouth Rinse BID  . citalopram  40 mg Oral  Daily  . docusate sodium  200 mg Oral Daily  . gabapentin  600 mg Oral BID  . insulin regular  0-10 Units Intravenous TID WC  . levalbuterol  0.63 mg Nebulization Q6H  . metoprolol tartrate  12.5 mg Oral BID   Or  . metoprolol tartrate  12.5 mg Per Tube BID  . nicotine  21 mg Transdermal Daily  . [START ON 06/10/2014] pantoprazole  40 mg Oral Daily  . sodium chloride  3 mL Intravenous Q12H   Continuous Infusions: . sodium chloride 20 mL/hr at 06/09/14 0400  . sodium chloride    . sodium chloride 20 mL (06/08/14 1402)  . dexmedetomidine 0.2 mcg/kg/hr (06/08/14 1708)  . insulin (NOVOLIN-R) infusion 1.4 Units/hr (06/09/14 0700)  . lactated ringers 40 mL/hr at 06/09/14 0400  . nitroGLYCERIN Stopped (06/08/14 1711)  . phenylephrine (NEO-SYNEPHRINE) Adult infusion Stopped (06/08/14 1301)   PRN Meds:.albumin human, metoprolol, midazolam, morphine injection, ondansetron (ZOFRAN) IV, oxyCODONE, sodium chloride, traMADol  General appearance: cooperative and no distress Neurologic: intact Heart: RRR, no murmur Lungs: Diminshed at bases Abdomen: soft, non-tender; bowel sounds normal; no masses,  no organomegaly Extremities: Mild LE edema Wound:  Aquacel on sternum;LE dressings are clean and dry  Lab Results: CBC: Recent Labs  06/08/14 1850 06/08/14 1857 06/09/14 0400  WBC 10.8*  --  11.4*  HGB 8.5* 9.2* 8.5*  HCT 25.5* 27.0* 25.9*  PLT 229  --  239   BMET:  Recent Labs  06/07/14 0944  06/08/14 1857 06/09/14 0400  NA 138  < > 146* 144  K 4.6  < > 4.2 4.0  CL 103  < > 111 114*  CO2 29  --   --  26  GLUCOSE 141*  < > 122* 116*  BUN 11  < > 8 8  CREATININE 1.02  < > 1.00 0.90  CALCIUM 8.6  --   --  8.1*  < > = values in this interval not displayed.  PT/INR:  Recent Labs  06/08/14 1300  LABPROT 16.1*  INR 1.28   Radiology: Dg Chest Port 1 View  06/08/2014   CLINICAL DATA:  Coronary artery disease; status post coronary artery bypass grafting. Hypoxia  EXAM: PORTABLE  CHEST - 1 VIEW  COMPARISON:  June 05, 2014  FINDINGS: Patient is status post coronary artery bypass grafting. Endotracheal tube tip is 3.3 cm above the carina. Nasogastric tube tip and side port are in the stomach. Swan-Ganz catheter tip is in the main pulmonary outflow tract. There is a chest tube on the left. No pneumothorax. Lungs are clear. The heart is upper normal in size with pulmonary vascularity within normal limits. No adenopathy.  IMPRESSION: Tube and catheter positions as described without pneumothorax. No edema or consolidation.   Electronically Signed   By: Lowella Grip M.D.   On: 06/08/2014 13:54     Assessment/Plan: S/P Procedure(s) (LRB): CORONARY ARTERY BYPASS GRAFTING (CABG) (N/A) TRANSESOPHAGEAL ECHOCARDIOGRAM (TEE) (N/A)  1. CV-SR in the 80's. Off cardiac drips. Lopressor 12.5 bid scheduled. 2. Pulmonary-On 1 liter of oxygen via [place]. Chest tubes with 279 since surgery. Possibly remove chest tubes later today if output continues to decrease. CXR shows no pneumothorax, bibasilar atelectasis. Encourage incentive spirometer and flutter valve. 3. Volume overload-Lasix 20 IV bid today 4. ABL anemia-H and H stable at 8.5 and 25.9 5. DM-CBGs 99/101/100. Will start scheduled Insulin to keep off Insulin drip. Will restart Metformin once tolerating oral better. Pre op HGA1C 10.5. Will need medical follow up after discharge. 6. Patient is using a lot of morphine, which makes her drowsy. Would like to give a few doses of Toradol. Will discuss with Dr. Servando Snare. 7. Remove swan and a line 8. See progression orders.   ZIMMERMAN,DONIELLE M [name] 06/09/2014 7:55 AM  Expected Acute  Blood - loss Anemia Resume long acting insulin and d/c drip Add toradol few doses to help with pain I have seen and examined Venita Sheffield and agree with the above assessment  and plan.  Grace Isaac MD Beeper [phone] Office [phone] 06/09/2014 9:58 AM

## 2014-06-09 NOTE — Progress Notes (Signed)
Swan & chest tubes removed per order. Patient tolerated well. Up to chair with 2 person assist. Lungs clear to diminished remains on 1L. Unable to get 02 sat probe to correlate. Changed the probe x2. When able to obtain reading 94-96%. Family at the bedside. Will cont. To monitor.

## 2014-06-10 ENCOUNTER — Inpatient Hospital Stay (HOSPITAL_COMMUNITY): Payer: Medicaid Other

## 2014-06-10 LAB — GLUCOSE, CAPILLARY
Glucose-Capillary: 130 mg/dL — ABNORMAL HIGH (ref 70–99)
Glucose-Capillary: 119 mg/dL — ABNORMAL HIGH (ref 70–99)
Glucose-Capillary: 126 mg/dL — ABNORMAL HIGH (ref 70–99)
Glucose-Capillary: 143 mg/dL — ABNORMAL HIGH (ref 70–99)
Glucose-Capillary: 147 mg/dL — ABNORMAL HIGH (ref 70–99)
Glucose-Capillary: 127 mg/dL — ABNORMAL HIGH (ref 70–99)
Glucose-Capillary: 144 mg/dL — ABNORMAL HIGH (ref 70–99)
Glucose-Capillary: 114 mg/dL — ABNORMAL HIGH (ref 70–99)
Glucose-Capillary: 127 mg/dL — ABNORMAL HIGH (ref 70–99)
Glucose-Capillary: 170 mg/dL — ABNORMAL HIGH (ref 70–99)

## 2014-06-10 LAB — CBC
HCT: 25.1 % — ABNORMAL LOW (ref 36.0–46.0)
Hemoglobin: 8.2 g/dL — ABNORMAL LOW (ref 12.0–15.0)
MCH: 25.1 pg — ABNORMAL LOW (ref 26.0–34.0)
MCHC: 32.7 g/dL (ref 30.0–36.0)
MCV: 76.8 fL — ABNORMAL LOW (ref 78.0–100.0)
Platelets: 239 10*3/uL (ref 150–400)
RBC: 3.27 MIL/uL — ABNORMAL LOW (ref 3.87–5.11)
RDW: 15.7 % — ABNORMAL HIGH (ref 11.5–15.5)
WBC: 10.5 10*3/uL (ref 4.0–10.5)

## 2014-06-10 LAB — BASIC METABOLIC PANEL
Anion gap: 4 — ABNORMAL LOW (ref 5–15)
BUN: 11 mg/dL (ref 6–23)
CO2: 27 mmol/L (ref 19–32)
Calcium: 8 mg/dL — ABNORMAL LOW (ref 8.4–10.5)
Chloride: 108 mmol/L (ref 96–112)
Creatinine, Ser: 1.09 mg/dL (ref 0.50–1.10)
GFR calc Af Amer: 63 mL/min — ABNORMAL LOW (ref >90)
GFR calc non Af Amer: 54 mL/min — ABNORMAL LOW (ref >90)
Glucose, Bld: 143 mg/dL — ABNORMAL HIGH (ref 70–99)
Potassium: 4.2 mmol/L (ref 3.5–5.1)
Sodium: 139 mmol/L (ref 135–145)

## 2014-06-10 MED ORDER — SODIUM CHLORIDE 0.9 % IV SOLN: 250.0000 mL | INTRAVENOUS | Status: DC | PRN

## 2014-06-10 MED ORDER — FUROSEMIDE 40 MG PO TABS
40.0000 mg | ORAL_TABLET | Freq: Every day | ORAL | Status: DC
  Administered 2014-06-11 – 2014-06-13 (×3): 40 mg via ORAL
  Filled 2014-06-10 (×3): qty 1

## 2014-06-10 MED ORDER — SODIUM CHLORIDE 0.9 % IJ SOLN: 3.0000 mL | INTRAMUSCULAR | Status: DC | PRN

## 2014-06-10 MED ORDER — POTASSIUM CHLORIDE CRYS ER 20 MEQ PO TBCR
20.0000 meq | EXTENDED_RELEASE_TABLET | Freq: Every day | ORAL | Status: DC
  Administered 2014-06-11 – 2014-06-13 (×3): 20 meq via ORAL
  Filled 2014-06-10 (×3): qty 1

## 2014-06-10 MED ORDER — SODIUM CHLORIDE 0.9 % IJ SOLN
3.0000 mL | Freq: Two times a day (BID) | INTRAMUSCULAR | Status: DC
  Administered 2014-06-10: 10 mL via INTRAVENOUS
  Administered 2014-06-11 – 2014-06-13 (×3): 3 mL via INTRAVENOUS

## 2014-06-10 MED ORDER — INSULIN ASPART 100 UNIT/ML ~~LOC~~ SOLN
0.0000 [IU] | Freq: Three times a day (TID) | SUBCUTANEOUS | Status: DC
  Administered 2014-06-10 (×2): 2 [IU] via SUBCUTANEOUS
  Administered 2014-06-10: 4 [IU] via SUBCUTANEOUS
  Administered 2014-06-11: 2 [IU] via SUBCUTANEOUS
  Administered 2014-06-11: 4 [IU] via SUBCUTANEOUS
  Administered 2014-06-11 (×2): 2 [IU] via SUBCUTANEOUS
  Administered 2014-06-12: 4 [IU] via SUBCUTANEOUS
  Administered 2014-06-12 – 2014-06-13 (×4): 2 [IU] via SUBCUTANEOUS

## 2014-06-10 MED ORDER — FOLIC ACID 1 MG PO TABS
1.0000 mg | ORAL_TABLET | Freq: Every day | ORAL | Status: DC
  Administered 2014-06-10 – 2014-06-13 (×4): 1 mg via ORAL
  Filled 2014-06-10 (×4): qty 1

## 2014-06-10 MED ORDER — POTASSIUM CHLORIDE CRYS ER 20 MEQ PO TBCR
20.0000 meq | EXTENDED_RELEASE_TABLET | Freq: Once | ORAL | Status: AC
Start: 2014-06-10 — End: 2014-06-10
  Administered 2014-06-10: 20 meq via ORAL
  Filled 2014-06-10: qty 1

## 2014-06-10 MED ORDER — FUROSEMIDE 10 MG/ML IJ SOLN
20.0000 mg | Freq: Two times a day (BID) | INTRAMUSCULAR | Status: AC
  Administered 2014-06-10: 20 mg via INTRAVENOUS
  Filled 2014-06-10 (×2): qty 2

## 2014-06-10 MED ORDER — MOVING RIGHT ALONG BOOK
Freq: Once | Status: AC
  Administered 2014-06-10: 10:00:00
  Filled 2014-06-10: qty 1

## 2014-06-10 MED ORDER — ALBUTEROL SULFATE (2.5 MG/3ML) 0.083% IN NEBU: 2.5000 mg | INHALATION_SOLUTION | RESPIRATORY_TRACT | Status: DC | PRN

## 2014-06-10 MED ORDER — FERROUS SULFATE 325 (65 FE) MG PO TABS
325.0000 mg | ORAL_TABLET | Freq: Every day | ORAL | Status: DC
  Administered 2014-06-10 – 2014-06-13 (×4): 325 mg via ORAL
  Filled 2014-06-10 (×5): qty 1

## 2014-06-10 NOTE — Op Note (Signed)
NAMEMarland Kitchen  Melinda, [name] NO.:  [id number]  MEDICAL RECORD NO.:  [id number]  LOCATION:  2S11C                        FACILITY:  [facility]  PHYSICIAN:  Lanelle Bal, MD    DATE OF BIRTH:  1956/04/14  DATE OF PROCEDURE:  06/08/2014 DATE OF DISCHARGE:                              OPERATIVE REPORT   PREOPERATIVE DIAGNOSES:  Recent acute myocardial infarction.  POSTOPERATIVE DIAGNOSIS:  Recent acute myocardial infarction.  SURGICAL PROCEDURES:  Coronary artery bypass grafting x4 with left internal mammary to the left anterior descending coronary artery, reverse saphenous vein graft to the diagonal coronary artery, reverse saphenous vein graft to the obtuse marginal coronary artery, reverse saphenous vein graft to the posterior descending coronary artery with right and left thigh, calf endovein harvesting.  SURGEON:  Lanelle Bal, MD  FIRST ASSISTANT:  Quincy Simmonds PA.  BRIEF HISTORY:  The patient is a 59 year old diabetic female with a long smoking history who presented to [facility] with presumed diagnosis of sinusitis with left facial and left jaw pain.  Troponins performed initially were negative and then rose to 30.  The patient was admitted with acute myocardial infarction and underwent cardiac catheterization by Dr. Ellyn Hack.  Because of significant three-vessel coronary artery disease, was transferred to [facility] for consideration of coronary artery bypass grafting.  The films were reviewed and options discussed with the patient including the risks and options of coronary artery bypass grafting.  She was agreeable and signed informed consent.  Overall, ventricular function was relatively well preserved with some inferior hypokinesis.  Overall, ejection fraction approximately 50%.  DESCRIPTION OF PROCEDURE:  With Swan-Ganz and arterial line monitors in place, the patient underwent general endotracheal anesthesia without incident.  Skin of  the chest and leg was prepped with Betadine and draped in the usual sterile manner.  Using the [employer] endovein harvesting system, segment of vein was harvested from the right thigh. In the lower leg, the vein became small and additional segment was harvested from the left thigh with endovein harvesting technique. Median sternotomy was performed.  Left internal mammary artery was dissected down as a pedicle graft.  The distal artery was divided, had good free flow.  Pericardium was opened.  Overall, ventricular function appeared preserved on anterior wall, somewhat depressed inferiorly, but without any areas of significant scarring or akinesis.  She was systemically heparinized.  Ascending aorta was cannulated.  The right atrium was cannulated and aortic root vent cardioplegia needle was introduced into the ascending aorta.  The patient was placed on cardiopulmonary bypass 2.4 L/min/m2.  Sites of anastomosis were inspected, were dissected out of the epicardium.  The very distal circumflex branch was very small.  The distal right coronary artery was severely calcified.  The posterior descending coronary artery was relatively small.  The patient's body temperature was cooled to 32 degrees.  Aortic crossclamp was applied and 500 mL cold blood potassium cardioplegia was administered with diastolic arrest of the heart. Myocardial septal temperature was monitored throughout the crossclamp. Attention was turned first to the posterior descending coronary artery, which was opened 1.3-1.4 mm in size proximally, 1.2 mm in size distally using a running 7-0 Prolene and distal  anastomosis was performed with a segment of reverse saphenous vein graft.  Attention was then turned to the circumflex coronary artery, which had a large dominant obtuse marginal vessel supplying the lateral wall.  This vessel was calcified proximally.  The vessel was opened and admitted 1.5 mm probe distally. Using a running 7-0  Prolene, distal anastomosis was performed. Additional cold blood cardioplegia was administered down the vein grafts.  The highest diagonal vessel was opened, was 1.5 mm in size. Using a running 7-0 Prolene, distal anastomosis was performed with a segment of reverse saphenous vein graft.  The midportion of the LAD was calcified.  The distal third of the vessel was of reasonable size, was opened, admitted 1.5 mm probe proximally and distally.  Using a running 8-0 Prolene, left internal mammary artery was anastomosed to the distal left anterior descending coronary artery.  With crossclamp still in place, three punch aortotomies were performed and each of the 3 vein grafts were anastomosed to the ascending aorta.  Air was evacuated from the grafts.  The bulldog on the mammary artery was removed with prompt rise in myocardial septal temperature.  Aortic crossclamp was removed with a total crossclamp time of 86 minutes.  The patient spontaneously converted to a sinus rhythm.  Atrial and ventricular pacing wires were applied.  She was transiently paced to increase the rate with the body temperature rewarmed to 37 degrees.  She was then ventilated and weaned from cardiopulmonary bypass without difficulty.  She remained hemodynamically stable.  Echocardiogram showed overall good LV function. She was decannulated in the usual fashion.  Protamine sulfate was administered with operative field hemostatic.  Atrial and ventricular pacing wires had been applied.  Graft marker was applied.  The left pleural tube and Blake mediastinal drain were left in place.  Sternum was closed with #6 stainless steel wire.  Fascia was closed with interrupted 0 Vicryl, running 3-0 Vicryl in subcutaneous tissue, 4-0 subcuticular stitch in skin edges.  Dry dressings were applied.  Sponge and needle count was reported as correct at the completion of procedure. The patient did not require any blood products during the  operative procedure.  She was transferred to Surgical Intensive Care Unit for further postoperative care having tolerated the procedure without obvious complication.     Lanelle Bal, MD     EG/MEDQ  D:  06/09/2014  T:  06/09/2014  Job:  [id number]

## 2014-06-10 NOTE — Progress Notes (Addendum)
TCTS DAILY ICU PROGRESS NOTE                   [address]            [place] [address]          [phone]   2 Days Post-Op Procedure(s) (LRB): CORONARY ARTERY BYPASS GRAFTING (CABG) (N/A) TRANSESOPHAGEAL ECHOCARDIOGRAM (TEE) (N/A)  Total Length of Stay:  LOS: 7 days   Subjective: Patient sitting in chair this am. She is hungry and wants more than full liquids.  Objective: Vital signs in last 24 hours: Temp:  [97.4 F (36.3 C)-98.3 F (36.8 C)] 98.3 F (36.8 C) (02/10 0700) Pulse Rate:  [73-85] 82 (02/10 0800) Cardiac Rhythm:  [-] Normal sinus rhythm (02/10 0800) Resp:  [11-32] 18 (02/10 0800) BP: (89-140)/(51-71) 94/55 mmHg (02/10 0800) SpO2:  [91 %-100 %] 98 % (02/10 0800) Arterial Line BP: (103-132)/(44-53) 106/51 mmHg (02/09 1500) FiO2 (%):  [90 %-98 %] 98 % (02/09 1000) Weight:  [253 lb 6.4 oz (114.941 kg)] 253 lb 6.4 oz (114.941 kg) (02/10 0700)  Filed Weights   06/08/14 0422 06/09/14 0500 06/10/14 0700  Weight: 243 lb 11.2 oz (110.542 kg) 250 lb 14.1 oz (113.8 kg) 253 lb 6.4 oz (114.941 kg)    Weight change: 2 lb 8.3 oz (1.141 kg)      Intake/Output from previous day: 02/09 0701 - 02/10 0700 In: 3893 [P.O.:420; I.V.:1033; IV Piggyback:100] Out: 2185 [Urine:2165; Chest Tube:20]  Total I/O In: 30 [I.V.:30] Out: -   Current Meds: Scheduled Meds: . acetaminophen  1,000 mg Oral 4 times per day   Or  . acetaminophen (TYLENOL) oral liquid 160 mg/5 mL  1,000 mg Per Tube 4 times per day  . antiseptic oral rinse  7 mL Mouth Rinse QID  . aspirin EC  325 mg Oral Daily   Or  . aspirin  324 mg Per Tube Daily  . atorvastatin  80 mg Oral q1800  . bisacodyl  10 mg Oral Daily   Or  . bisacodyl  10 mg Rectal Daily  . chlorhexidine  15 mL Mouth Rinse BID  . citalopram  40 mg Oral Daily  . docusate sodium  200 mg Oral Daily  . enoxaparin (LOVENOX) injection  40 mg Subcutaneous Q24H  . gabapentin  600 mg Oral BID  . insulin aspart  0-24 Units  Subcutaneous 6 times per day  . insulin detemir  15 Units Subcutaneous Daily  . metoprolol tartrate  12.5 mg Oral BID   Or  . metoprolol tartrate  12.5 mg Per Tube BID  . nicotine  21 mg Transdermal Daily  . pantoprazole  40 mg Oral Daily  . sodium chloride  3 mL Intravenous Q12H   Continuous Infusions: . sodium chloride 20 mL/hr at 06/09/14 0400  . sodium chloride Stopped (06/09/14 0900)  . sodium chloride 20 mL (06/08/14 1402)  . dexmedetomidine 0.2 mcg/kg/hr (06/08/14 1708)  . lactated ringers 10 mL/hr at 06/09/14 1500  . nitroGLYCERIN Stopped (06/08/14 1711)  . phenylephrine (NEO-SYNEPHRINE) Adult infusion Stopped (06/08/14 1301)   PRN Meds:.metoprolol, midazolam, morphine injection, ondansetron (ZOFRAN) IV, oxyCODONE, sodium chloride, traMADol  General appearance: cooperative and no distress Neurologic: intact Heart: RRR, no murmur Lungs: Diminshed at bases Abdomen: soft, non-tender; bowel sounds normal; no masses,  no organomegaly Extremities: Mild LE edema Wound: Aquacel on sternum;LE wounds are clean and dry  Lab Results: CBC:  Recent Labs  06/09/14 1700 06/09/14 1822 06/10/14 0425  WBC 12.3*  --  10.5  HGB 8.2* 8.8* 8.2*  HCT 25.6* 26.0* 25.1*  PLT 234  --  239   BMET:  Recent Labs  06/09/14 0400  06/09/14 1822 06/10/14 0425  NA 144  --  140 139  K 4.0  --  4.2 4.2  CL 114*  --  104 108  CO2 26  --   --  27  GLUCOSE 116*  --  154* 143*  BUN 8  --  10 11  CREATININE 0.90  < > 1.10 1.09  CALCIUM 8.1*  --   --  8.0*  < > = values in this interval not displayed.  PT/INR:   Recent Labs  06/08/14 1300  LABPROT 16.1*  INR 1.28   Radiology: Dg Chest Port 1 View  06/09/2014   CLINICAL DATA:  Coronary artery disease.  Status post CABG.  EXAM: PORTABLE CHEST - 1 VIEW  COMPARISON:  06/08/2014  FINDINGS: Endotracheal tube and NG tube have been removed. Swan-Ganz catheter and chest tubes are unchanged. No pneumothorax.  Increased atelectasis at the right  lung base. New slight atelectasis at the left lung base.  IMPRESSION: Bibasilar atelectasis, increased.   Electronically Signed   By: Lorriane Shire M.D.   On: 06/09/2014 07:49    Assessment/Plan: S/P Procedure(s) (LRB): CORONARY ARTERY BYPASS GRAFTING (CABG) (N/A) TRANSESOPHAGEAL ECHOCARDIOGRAM (TEE) (N/A)  1. CV-SR in the 80's. On Lopressor 12.5 bid scheduled. Parameters in place so will hold this am if SBP less than 100 2. Pulmonary-On 1 liter of oxygen via [place].  CXR shows no pneumothorax, bibasilar atelectasis, and small right pleural effusion. Encourage incentive spirometer and flutter valve. 3. Volume overload-will give Lasix today 4. ABL anemia-H and H stable at 8.2 and 25.1. Start Ferrous and folic acid. 5. DM-CBGs 139/181/130. On Levemir 15 units to keep off Insulin drip. Will restart Metformin in am when tolerating diet better (on full liquids this am). Pre op HGA1C 10.5. Will need medical follow up after discharge. 6. Toradol helped with pain yesterday. Continue oral narcotics PRN. 7. Remove sleeve and foley 8. If bed available, could transfer to [place] [name] 06/10/2014 8:59 AM

## 2014-06-10 NOTE — Evaluation (Signed)
Physical Therapy Evaluation Patient Details Name: Melinda [name] MRN: [id number] DOB: [birth year] Today's Date: 06/10/2014   History of Present Illness  59 y.o. female with h/o DM2, HTN, ongoing tobacco abuse, anxiety, and neuropathy who presented to [facility] with symptoms of "not feeling well." Symptoms included left nasal bridge pain, diffuse abdominal pain, and one episode of chest pain. She was found to have sinusitis and an elevated troponin (initially <0.02, subsequently trended to >30 overnight). 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 Pt currently with functional limitations due to the deficits listed below (see PT Problem List). Pt with visual disturbance today that she describes as "woozy headed", "dizzy", and occasional "double vision" as well as left sided coordination deficits and functional weakness. Symptoms concerning as she reports that they were not present before surgery. Pt unable to ambulate further than 6' with RW and mod A +2 due to very high fall risk.  Pt will benefit from skilled PT to increase their independence and safety with mobility to allow discharge to the venue listed below.       Follow Up Recommendations Supervision/Assistance - 24 hour;CIR    Equipment Recommendations  Rolling walker with 5" wheels    Recommendations for Other Services OT consult;Other (comment) (neurology?)     Precautions / Restrictions Precautions Precautions: Fall;Sternal Precaution Comments: reviewed sternal precautions      Mobility  Bed Mobility Overal bed mobility: Needs Assistance;+2 for physical assistance Bed Mobility: Sit to Supine       Sit to supine: Mod assist;+2 for physical assistance   General bed mobility comments: pt required mod A for legs into bed and trunk lowered to bed. Pt lethargic and keeps saying that her head just doesn't feel right  Transfers Overall transfer level: Needs  assistance Equipment used: 2 person hand held assist;Rolling walker (2 wheeled) Transfers: Sit to/from [facility] Sit to Stand: +2 physical assistance;Mod assist Stand pivot transfers: Mod assist;+2 physical assistance       General transfer comment: pt leaning to left with left side slightly collapsing, difficulty with coordination with pivot to chair. Pt reports that her vision seems blurry and she just can't focus. She reports that this began after surgery. RN present for this report.   Ambulation/Gait Ambulation/Gait assistance: +2 physical assistance;Mod assist Ambulation Distance (Feet): 6 Feet Assistive device: Rolling walker (2 wheeled) Gait Pattern/deviations: Step-through pattern;Decreased weight shift to right;Staggering left;Trunk flexed Gait velocity: very slow Gait velocity interpretation: <1.8 ft/sec, indicative of risk for recurrent falls General Gait Details: pt feeling so "woozy" she could barely maintain standing, BP 98/80 which was an elevation from seated position. Could not safely ambulate more than 6', even with mod A and RW. Significant left lean and poor coordination of mvmt  Stairs            Wheelchair Mobility    Modified Rankin (Stroke Patients Only)       Balance Overall balance assessment: Needs assistance Sitting-balance support: Bilateral upper extremity supported Sitting balance-Leahy Scale: Poor Sitting balance - Comments: sitting EOB needed UE support as well as min A due to dizziness Postural control: Left lateral lean Standing balance support: Bilateral upper extremity supported;During functional activity Standing balance-Leahy Scale: Poor                               Pertinent Vitals/Pain Pain Assessment: No/denies pain  BP  98/80 O2 sats 98% on RA    Home Living Family/patient expects to be discharged to:: Private residence Living Arrangements: Alone Available Help at Discharge: Family;Available  24 hours/day (at sister's home) Type of Home: House Home Access: Level entry     Home Layout: One level Home Equipment: None Additional Comments: pt reports that she can d/c to her sister's home since she lives alone    Prior Function Level of Independence: Independent         Comments: pt's neighbor drives her to the store and she uses cart to "hold her up" while she walks around. Does everything for herself     Hand Dominance   Dominant Hand: Right    Extremity/Trunk Assessment   Upper Extremity Assessment: Generalized weakness;Defer to OT evaluation           Lower Extremity Assessment: LLE deficits/detail   LLE Deficits / Details: pt tests 4+/5 at left knee and 4/5 at left hip but with mobility, pt demonstrates poor coordination of left side, loses balance to left, left knee slightly buckling.   Cervical / Trunk Assessment: Kyphotic  Communication   Communication: No difficulties  Cognition Arousal/Alertness: Lethargic;Suspect due to medications Behavior During Therapy: [facility] for tasks assessed/performed Overall Cognitive Status: Within Functional Limits for tasks assessed                      General Comments General comments (skin integrity, edema, etc.): hopeful that visual deficits and poor coordination of left side are a result of the pain medicine that she received earlier but may benefit from neurology consult. Spoke with RN about this.    Exercises General Exercises - Lower Extremity Ankle Circles/Pumps: AROM;Both;10 reps;Seated Long Arc Quad: AROM;Both;10 reps;Seated Hip Flexion/Marching: AROM;Both;10 reps;Seated;Standing      Assessment/Plan    PT Assessment Patient needs continued PT services  PT Diagnosis Difficulty walking;Abnormality of gait;Generalized weakness   PT Problem List Decreased strength;Decreased activity tolerance;Decreased balance;Decreased mobility;Decreased coordination;Decreased knowledge of  precautions;Cardiopulmonary status limiting activity  PT Treatment Interventions DME instruction;Gait training;Functional mobility training;Therapeutic activities;Therapeutic exercise;Balance training;Neuromuscular re-education;Cognitive remediation;Patient/family education   PT Goals (Current goals can be found in the Care Plan section) Acute Rehab PT Goals Patient Stated Goal: figure out what's wrong with her vision PT Goal Formulation: With patient Time For Goal Achievement: 06/24/14 Potential to Achieve Goals: Good    Frequency Min 3X/week   Barriers to discharge Decreased caregiver support      Co-evaluation               End of Session Equipment Utilized During Treatment: Gait belt Activity Tolerance: Patient limited by lethargy Patient left: in bed;with call bell/phone within reach;with nursing/sitter in room Nurse Communication: Mobility status         Time: 1062-6948 PT Time Calculation (min) (ACUTE ONLY): 26 min   Charges:   PT Evaluation $Initial PT Evaluation Tier I: 1 Procedure PT Treatments $Therapeutic Activity: 8-22 mins   PT G Codes:      Leighton Roach, PT  Acute Rehab Services  [place], [place] 06/10/2014, 4:08 PM

## 2014-06-11 ENCOUNTER — Inpatient Hospital Stay (HOSPITAL_COMMUNITY): Payer: Medicaid Other

## 2014-06-11 LAB — BASIC METABOLIC PANEL
Anion gap: 10 (ref 5–15)
BUN: 14 mg/dL (ref 6–23)
CO2: 21 mmol/L (ref 19–32)
Calcium: 8.1 mg/dL — ABNORMAL LOW (ref 8.4–10.5)
Chloride: 106 mmol/L (ref 96–112)
Creatinine, Ser: 0.92 mg/dL (ref 0.50–1.10)
GFR calc Af Amer: 77 mL/min — ABNORMAL LOW (ref >90)
GFR calc non Af Amer: 66 mL/min — ABNORMAL LOW (ref >90)
Glucose, Bld: 150 mg/dL — ABNORMAL HIGH (ref 70–99)
Potassium: 4.8 mmol/L (ref 3.5–5.1)
Sodium: 137 mmol/L (ref 135–145)

## 2014-06-11 LAB — CBC
HCT: 25.3 % — ABNORMAL LOW (ref 36.0–46.0)
Hemoglobin: 8.2 g/dL — ABNORMAL LOW (ref 12.0–15.0)
MCH: 24.6 pg — ABNORMAL LOW (ref 26.0–34.0)
MCHC: 32.4 g/dL (ref 30.0–36.0)
MCV: 75.7 fL — ABNORMAL LOW (ref 78.0–100.0)
Platelets: 278 10*3/uL (ref 150–400)
RBC: 3.34 MIL/uL — ABNORMAL LOW (ref 3.87–5.11)
RDW: 15.4 % (ref 11.5–15.5)
WBC: 11.3 10*3/uL — ABNORMAL HIGH (ref 4.0–10.5)

## 2014-06-11 LAB — GLUCOSE, CAPILLARY
Glucose-Capillary: 158 mg/dL — ABNORMAL HIGH (ref 70–99)
Glucose-Capillary: 165 mg/dL — ABNORMAL HIGH (ref 70–99)
Glucose-Capillary: 154 mg/dL — ABNORMAL HIGH (ref 70–99)
Glucose-Capillary: 155 mg/dL — ABNORMAL HIGH (ref 70–99)

## 2014-06-11 MED ORDER — METFORMIN HCL 500 MG PO TABS
500.0000 mg | ORAL_TABLET | Freq: Every day | ORAL | Status: DC
  Administered 2014-06-11 – 2014-06-13 (×3): 500 mg via ORAL
  Filled 2014-06-11 (×4): qty 1

## 2014-06-11 NOTE — Progress Notes (Signed)
Physical Therapy Treatment Patient Details Name: Melinda [name] MRN: [id number] DOB: 1956/03/26 Today's Date: 06/11/2014    History of Present Illness 59 y.o. female with h/o DM2, HTN, ongoing tobacco abuse, anxiety, and neuropathy who presented to [facility] with symptoms of "not feeling well." Symptoms included left nasal bridge pain, diffuse abdominal pain, and one episode of chest pain. She was found to have sinusitis and an elevated troponin (initially <0.02, subsequently trended to >30 overnight). 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  HHPT will need to follow up and will need assistance from family continually for the beginning of discharge home.  Follow Up Recommendations  Supervision/Assistance - 24 hour;Home health PT     Equipment Recommendations  Rolling walker with 5" wheels    Recommendations for Other Services       Precautions / Restrictions Precautions Precautions: Fall;Sternal Precaution Comments: reminded pt of precautions Restrictions Weight Bearing Restrictions: No    Mobility  Bed Mobility Overal bed mobility: Needs Assistance             General bed mobility comments: up when PT entered  Transfers Overall transfer level: Needs assistance Equipment used: Rolling walker (2 wheeled);1 person hand held assist Transfers: Sit to/from [facility] Sit to Stand: Mod assist;From elevated surface (using pillow) Stand pivot transfers: Min assist       General transfer comment: pt leaning to L as she fatigues but no LOB and sat in chair to rest after walking  Ambulation/Gait Ambulation/Gait assistance: Min assist Ambulation Distance (Feet): 50 Feet Assistive device: Rolling walker (2 wheeled) Gait Pattern/deviations: Step-through pattern;Decreased stride length;Decreased dorsiflexion - right;Decreased dorsiflexion -  left;Shuffle;Wide base of support Gait velocity: reduced Gait velocity interpretation: Below normal speed for age/gender General Gait Details: No complaints of feeling unsteady but got tired after walking and needed to sit in wc.  Pt is refusing to consider inpt therapy after this and insists she wants to go to her sister;s house   Stairs            Wheelchair Mobility    Modified Rankin (Stroke Patients Only)       Balance Overall balance assessment: Needs assistance   Sitting balance-Leahy Scale: Fair   Postural control: Left lateral lean (after fatigued) Standing balance support: Bilateral upper extremity supported (used RW) Standing balance-Leahy Scale: Poor Standing balance comment: Pt does not seem to have a strategy for balance and will wait for PT to cue her                    Cognition Arousal/Alertness: Awake/alert Behavior During Therapy: WFL for tasks assessed/performed Overall Cognitive Status: Within Functional Limits for tasks assessed                      Exercises      General Comments General comments (skin integrity, edema, etc.): Pt is much more stable today but did lean a bit to L, and noted O2 sats were fine afterward.  Walked on room air at nursing request and she started at 95% then dropped only to 93% after50' gait.      Pertinent Vitals/Pain Pain Assessment: 0-10 Pain Score: 4  Pain Location: L ribcage Pain Intervention(s): Limited activity within patient's tolerance;Monitored during session;Repositioned;Patient requesting pain meds-RN notified    Home Living  Prior Function            PT Goals (current goals can now be found in the care plan section) Acute Rehab PT Goals Patient Stated Goal: no goals stated Progress towards PT goals: Progressing toward goals    Frequency  Min 3X/week    PT Plan Current plan remains appropriate    Co-evaluation             End of Session  Equipment Utilized During Treatment: Gait belt Activity Tolerance: Patient limited by fatigue Patient left: in chair;with call bell/phone within reach     Time: 4742-5956 PT Time Calculation (min) (ACUTE ONLY): 31 min  Charges:  $Gait Training: 8-22 mins $Therapeutic Activity: 8-22 mins                    G Codes:      Ramond Dial 2014-07-06, 5:40 PM  Mee Hives, PT MS Acute Rehab Dept. Number: [phone]

## 2014-06-11 NOTE — Progress Notes (Signed)
Pt assisted up OOB to BR; no BM at this time; pt passing gas; pt encouraged to ambulate at this time; pt not wanting to ambulate; pt encouraged to sit up in chair; pt assisted to chair; call bell w/i reach; O2 2L still in place; sats 94% 2L [place]; pt encouraged to ambulate again before 7 pm and then again before bedtime; will cont. To monitor.

## 2014-06-11 NOTE — Progress Notes (Signed)
Pt sleeping; O2 sats 84% RA; O2 2L [place] applied; sats up to 92%; will cont. To monitor.

## 2014-06-11 NOTE — Progress Notes (Signed)
Rehab Admissions Coordinator Note:  Patient was screened by Retta Diones for appropriateness for an Inpatient Acute Rehab Consult.  At this time, we are recommending Inpatient Rehab consult.  Retta Diones 06/11/2014, 9:23 AM  I can be reached at [phone].

## 2014-06-11 NOTE — Progress Notes (Signed)
CARDIAC REHAB PHASE I   PRE:  Rate/Rhythm: 85 SR  BP:  Supine:   Sitting: 129/63  Standing:    SaO2: 90 EA  MODE:  Ambulation: 150 ft   POST:  Rate/Rhythm: 93 SR  BP:  Supine:   Sitting: 11/54  Standing:    SaO2: 95-96 RA 1135-1200 Assisted X 1 and used walker to ambulate. Gait steady with walker slow pace. Pt tires as she walked and she took several short standing rest stops. Pt is groggy and wanted to close her eyes a lot. As she tires she became a little unsteady, had pt's nurse to walk with [name] back to room. Placed pt back in recliner after walk with call light in reach.  Rodney Langton RN 06/11/2014 12:01 PM

## 2014-06-11 NOTE — Progress Notes (Signed)
[address]       [place] [address]             [phone]      3 Days Post-Op Procedure(s) (LRB): CORONARY ARTERY BYPASS GRAFTING (CABG) (N/A) TRANSESOPHAGEAL ECHOCARDIOGRAM (TEE) (N/A) Subjective: Sore, but feeling ok overall  Objective: Vital signs in last 24 hours: Temp:  [98.3 F (36.8 C)-98.7 F (37.1 C)] 98.7 F (37.1 C) (02/11 0616) Pulse Rate:  [82-91] 87 (02/11 0616) Cardiac Rhythm:  [-] Normal sinus rhythm (02/10 2128) Resp:  [15-33] 20 (02/10 2023) BP: (83-121)/(50-64) 108/61 mmHg (02/11 0616) SpO2:  [91 %-100 %] 95 % (02/11 0616) Weight:  [261 lb 14.5 oz (118.8 kg)] 261 lb 14.5 oz (118.8 kg) (02/11 0616)  Hemodynamic parameters for last 24 hours:    Intake/Output from previous day: 02/10 0701 - 02/11 0700 In: 790 [P.O.:700; I.V.:90] Out: 235 [Urine:235] Intake/Output this shift:    General appearance: alert, cooperative and no distress Heart: regular rate and rhythm Lungs: dim in bases bilat Abdomen: benign Extremities: minimal edema Wound: incis ok- chest dressing in place  Lab Results:  Recent Labs  06/10/14 0425 06/11/14 0611  WBC 10.5 11.3*  HGB 8.2* 8.2*  HCT 25.1* 25.3*  PLT 239 278   BMET:  Recent Labs  06/09/14 0400  06/09/14 1822 06/10/14 0425  NA 144  --  140 139  K 4.0  --  4.2 4.2  CL 114*  --  104 108  CO2 26  --   --  27  GLUCOSE 116*  --  154* 143*  BUN 8  --  10 11  CREATININE 0.90  < > 1.10 1.09  CALCIUM 8.1*  --   --  8.0*  < > = values in this interval not displayed.  PT/INR:  Recent Labs  06/08/14 1300  LABPROT 16.1*  INR 1.28   ABG    Component Value Date/Time   PHART 7.366 06/08/2014 1852   HCO3 27.6* 06/08/2014 1852   TCO2 22 06/09/2014 1822   ACIDBASEDEF 1.0 06/08/2014 1756   O2SAT 93.0 06/08/2014 1852   CBG (last 3)   Recent Labs  06/10/14 1132 06/10/14 1601 06/10/14 2021  GLUCAP 144* 127* 170*    Meds Scheduled Meds: . acetaminophen  1,000 mg Oral 4 times  per day   Or  . acetaminophen (TYLENOL) oral liquid 160 mg/5 mL  1,000 mg Per Tube 4 times per day  . antiseptic oral rinse  7 mL Mouth Rinse QID  . aspirin EC  325 mg Oral Daily   Or  . aspirin  324 mg Per Tube Daily  . atorvastatin  80 mg Oral q1800  . bisacodyl  10 mg Oral Daily   Or  . bisacodyl  10 mg Rectal Daily  . chlorhexidine  15 mL Mouth Rinse BID  . citalopram  40 mg Oral Daily  . docusate sodium  200 mg Oral Daily  . enoxaparin (LOVENOX) injection  40 mg Subcutaneous Q24H  . ferrous sulfate  325 mg Oral Q breakfast  . folic acid  1 mg Oral Daily  . furosemide  20 mg Intravenous BID  . furosemide  40 mg Oral Daily  . gabapentin  600 mg Oral BID  . insulin aspart  0-24 Units Subcutaneous TID AC & HS  . insulin detemir  15 Units Subcutaneous Daily  . metoprolol tartrate  12.5 mg Oral BID   Or  . metoprolol tartrate  12.5 mg Per Tube BID  . nicotine  21 mg Transdermal Daily  . pantoprazole  40 mg Oral Daily  . potassium chloride  20 mEq Oral Daily  . sodium chloride  3 mL Intravenous Q12H  . sodium chloride  3 mL Intravenous Q12H   Continuous Infusions: . sodium chloride 20 mL/hr at 06/09/14 0400  . sodium chloride Stopped (06/09/14 0900)  . sodium chloride 20 mL (06/08/14 1402)  . lactated ringers 10 mL/hr at 06/09/14 1500   PRN Meds:.sodium chloride, albuterol, metoprolol, morphine injection, ondansetron (ZOFRAN) IV, oxyCODONE, sodium chloride, sodium chloride, traMADol  Xrays Dg Chest Port 1 View  06/10/2014   CLINICAL DATA:  Post CABG.  Coronary artery disease.  EXAM: PORTABLE CHEST - 1 VIEW  COMPARISON:  06/09/2014  FINDINGS: The Swan-Ganz catheter has been removed. Right jugular central line is still present. Median sternotomy wires and post CABG changes in the chest. The chest drains have been removed and no evidence for a pneumothorax. Linear densities in both lower lungs are suggestive for atelectasis. Hazy densities at the right lung base suggest a small  right pleural effusion.  IMPRESSION: Removal of chest drains and no evidence for a pneumothorax.  Right basilar densities suggest atelectasis and possibly a small right pleural effusion.   Electronically Signed   By: Markus Daft M.D.   On: 06/10/2014 08:25    Assessment/Plan: S/P Procedure(s) (LRB): CORONARY ARTERY BYPASS GRAFTING (CABG) (N/A) TRANSESOPHAGEAL ECHOCARDIOGRAM (TEE) (N/A)  1 good overall progress 2 sinus rhythm 3 ABL anemia- expected/stable 4 mild volume overload- cont gentle diuresis 5 atx/ small effusions- push pulm toilet 6 routine rehab modalities 7 sugars ok- start metformin, was on BID lantus at home- monitor closely    LOS: 8 days    Licet Dunphy E 06/11/2014

## 2014-06-11 NOTE — Addendum Note (Signed)
Addendum  created 06/11/14 2229 by Claris Che, CRNA   Modules edited: Anesthesia Flowsheet

## 2014-06-12 DIAGNOSIS — Z951 Presence of aortocoronary bypass graft: Secondary | ICD-10-CM

## 2014-06-12 LAB — GLUCOSE, CAPILLARY
Glucose-Capillary: 136 mg/dL — ABNORMAL HIGH (ref 70–99)
Glucose-Capillary: 127 mg/dL — ABNORMAL HIGH (ref 70–99)
Glucose-Capillary: 106 mg/dL — ABNORMAL HIGH (ref 70–99)
Glucose-Capillary: 185 mg/dL — ABNORMAL HIGH (ref 70–99)

## 2014-06-12 NOTE — Progress Notes (Signed)
Inpatient Diabetes Program Recommendations  AACE/ADA: New Consensus Statement on Inpatient Glycemic Control (2013)  Target Ranges:  Prepandial:   less than 140 mg/dL      Peak postprandial:   less than 180 mg/dL (1-2 hours)      Critically ill patients:  140 - 180 mg/dL   Reason for Assessment:  Spoke briefly with patient regarding home diabetes management.   Results for Melinda [name], Melinda [name] (MRN [id number]) as of 06/12/2014 15:27  Ref. Range 06/07/2014 09:44  Hemoglobin A1C Latest Range: 4.8-5.6 % 10.5 (H)  Diabetes history:  Diabetes Mellitus Type 2 Outpatient Diabetes medications: Lantus 35 units bid, Metformin 1000 mg bid Current orders for Inpatient glycemic control:  Levemir 15 units daily, Novolog TCTS tid with meals and HS  Discussed A1C results with patient.  Interestingly, CBG have been well controlled post-op with only Levemir 15 units daily and correction.  Discussed with patient.  She states that she see's MD at [facility] and has been working on her blood sugar control.  No needs noted at this time.  She is pleased with her blood sugar control in the hospital.   Thanks, Adah Perl, RN, BC-ADM Inpatient Diabetes Coordinator Pager [phone]

## 2014-06-12 NOTE — Progress Notes (Signed)
CARDIAC REHAB PHASE I   PRE:  Rate/Rhythm: 78 SR  BP:  Supine:   Sitting: 116/61  Standing:    SaO2: 97 RA  MODE:  Ambulation: 150 ft   POST:  Rate/Rhythm: 90 SR  BP:  Supine:   Sitting: 115/51  Standing:    SaO2: 94 RA 1020-1045 Assisted X 1 and used walker to ambulate. Gait steady with walker. Pt tires as she walks but she was less tire today and more steady. I was unable to get her to walk any further than 150 c/o of being to tired.VS stable Pt back to recliner after walk with call light in reach. Encouraged use of IS hourly.  Rodney Langton RN 06/12/2014 10:51 AM

## 2014-06-12 NOTE — Progress Notes (Signed)
[address]       [place] [address]             [phone]      4 Days Post-Op Procedure(s) (LRB): CORONARY ARTERY BYPASS GRAFTING (CABG) (N/A) TRANSESOPHAGEAL ECHOCARDIOGRAM (TEE) (N/A) Subjective: Looks and feels better/stronger  Objective: Vital signs in last 24 hours: Temp:  [98.3 F (36.8 C)-98.6 F (37 C)] 98.3 F (36.8 C) (02/12 0541) Pulse Rate:  [80-84] 80 (02/12 0541) Cardiac Rhythm:  [-] Normal sinus rhythm (02/11 2202) Resp:  [16-18] 18 (02/11 2113) BP: (115-117)/(64-74) 115/66 mmHg (02/12 0541) SpO2:  [84 %-98 %] 97 % (02/12 0541) Weight:  [256 lb 6.3 oz (116.3 kg)] 256 lb 6.3 oz (116.3 kg) (02/12 0541)  Hemodynamic parameters for last 24 hours:    Intake/Output from previous day: 02/11 0701 - 02/12 0700 In: 240 [P.O.:240] Out: 2250 [Urine:2250] Intake/Output this shift:    General appearance: alert, cooperative and no distress Heart: regular rate and rhythm Lungs: din in right >left base Abdomen: benign Extremities: no edema Wound: incis healing well  Lab Results:  Recent Labs  06/10/14 0425 06/11/14 0611  WBC 10.5 11.3*  HGB 8.2* 8.2*  HCT 25.1* 25.3*  PLT 239 278   BMET:  Recent Labs  06/10/14 0425 06/11/14 0611  NA 139 137  K 4.2 4.8  CL 108 106  CO2 27 21  GLUCOSE 143* 150*  BUN 11 14  CREATININE 1.09 0.92  CALCIUM 8.0* 8.1*    PT/INR: No results for input(s): LABPROT, INR in the last 72 hours. ABG    Component Value Date/Time   PHART 7.366 06/08/2014 1852   HCO3 27.6* 06/08/2014 1852   TCO2 22 06/09/2014 1822   ACIDBASEDEF 1.0 06/08/2014 1756   O2SAT 93.0 06/08/2014 1852   CBG (last 3)   Recent Labs  06/11/14 1633 06/11/14 2111 06/12/14 0552  GLUCAP 154* 155* 136*    Meds Scheduled Meds: . acetaminophen  1,000 mg Oral 4 times per day   Or  . acetaminophen (TYLENOL) oral liquid 160 mg/5 mL  1,000 mg Per Tube 4 times per day  . antiseptic oral rinse  7 mL Mouth Rinse QID  .  aspirin EC  325 mg Oral Daily   Or  . aspirin  324 mg Per Tube Daily  . atorvastatin  80 mg Oral q1800  . bisacodyl  10 mg Oral Daily   Or  . bisacodyl  10 mg Rectal Daily  . chlorhexidine  15 mL Mouth Rinse BID  . citalopram  40 mg Oral Daily  . docusate sodium  200 mg Oral Daily  . enoxaparin (LOVENOX) injection  40 mg Subcutaneous Q24H  . ferrous sulfate  325 mg Oral Q breakfast  . folic acid  1 mg Oral Daily  . furosemide  40 mg Oral Daily  . gabapentin  600 mg Oral BID  . insulin aspart  0-24 Units Subcutaneous TID AC & HS  . insulin detemir  15 Units Subcutaneous Daily  . metFORMIN  500 mg Oral Q breakfast  . metoprolol tartrate  12.5 mg Oral BID   Or  . metoprolol tartrate  12.5 mg Per Tube BID  . nicotine  21 mg Transdermal Daily  . pantoprazole  40 mg Oral Daily  . potassium chloride  20 mEq Oral Daily  . sodium chloride  3 mL Intravenous Q12H  . sodium chloride  3 mL Intravenous Q12H   Continuous Infusions: .  sodium chloride 20 mL/hr at 06/09/14 0400  . sodium chloride Stopped (06/09/14 0900)  . sodium chloride 20 mL (06/08/14 1402)  . lactated ringers 10 mL/hr at 06/09/14 1500   PRN Meds:.sodium chloride, albuterol, metoprolol, morphine injection, ondansetron (ZOFRAN) IV, oxyCODONE, sodium chloride, sodium chloride, traMADol  Xrays Dg Chest 2 View  06/11/2014   CLINICAL DATA:  Status post CABG 3 days ago, weakness and chest soreness  EXAM: CHEST  2 VIEW  COMPARISON:  Portable chest x-ray of June 10, 2014  FINDINGS: The lungs are reasonably well inflated. There is a small right pleural effusion. There is bibasilar subsegmental atelectasis which is stable. The cardiac silhouette is mildly enlarged but can't is less conspicuous today. The pulmonary vascularity is not engorged. There are 7 intact sternal wires. The right internal jugular Cordis sheath has been removed.  IMPRESSION: Persistent bibasilar atelectasis and small right pleural effusion not greatly changed  from the previous study allowing for differences in positioning.   Electronically Signed   By: David  [place]   On: 06/11/2014 08:04    Assessment/Plan: S/P Procedure(s) (LRB): CORONARY ARTERY BYPASS GRAFTING (CABG) (N/A) TRANSESOPHAGEAL ECHOCARDIOGRAM (TEE) (N/A)  1 conts with good progress 2 d/c epw's today 3 cont rehab/pulm toilet/ wean O2 4 cbg's ok 5 poss home24-48 hours   LOS: 9 days    Melinda [name] E 06/12/2014

## 2014-06-12 NOTE — Progress Notes (Signed)
Pt ambulated in hallway approx 168ft with rolling walker. Tolerated ok. Encouraged to ambulate again before bed. Monitoring will continue.

## 2014-06-12 NOTE — Discharge Summary (Signed)
Physician Discharge Summary  Patient ID: Melinda [name] MRN: [id number] DOB/AGE: [birth year] 61 y.o.  Admit date: 06/03/2014 Discharge date: 06/13/2014  Admission Diagnoses:  Patient Active Problem List   Diagnosis Date Noted  . 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 CAD (coronary artery disease) 06/08/2014  . Coronary artery disease involving native coronary artery of native heart without angina pectoris   . HTN (hypertension)   . DM2 (diabetes mellitus, type 2)   . GERD (gastroesophageal reflux disease)   . NSTEMI (non-ST elevated myocardial infarction) 06/03/2014   Discharge Diagnoses:   Patient Active Problem List   Diagnosis Date Noted  . 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 CAD (coronary artery disease) 06/08/2014  . Coronary artery disease involving native coronary artery of native heart without angina pectoris   . HTN (hypertension)   . DM2 (diabetes mellitus, type 2)   . GERD (gastroesophageal reflux disease)   . NSTEMI (non-ST elevated myocardial infarction) 06/03/2014   Discharged Condition: good  History of Present Illness:   Melinda [name] is a 59 yo obese African American Female. She presented to [facility] ED with complaints of headache, chest tightness, neck pain, nasal pain, nausea and overall just didn't feel well. Workup in the ED revealed positive Troponin level, hypotension, and CT scan of sinuses confirmed a sinus infection. She was started on Augmentin, Heparin and IV fluids. She underwent cardiac catheterization which showed a mildly reduced EF of 45% and severe 3 vessel CAD. It was felt CABG would be the best treatment of choice and she was transferred to [facility] for further care.   Hospital Course:   Upon arrival to [facility] the patient was admitted to the Cardiology service.  She remained on a Heparin drip and was chest pain free during admission.  She was evaluated by Melinda [name] who was in agreement she would benefit from Coronary Bypass Grafting  procedure.  The risks and benefits of the procedure were explained to the patient and she was agreeable to proceed.  She was taken to the operating room on 06/08/2014.  She underwent CABG x 4 utilizing LIMA to LAD, SVG to Diagonal, SVG to OM, and SVG to PDA.  She also underwent bilateral endoscopic saphenous vein harvest.  She tolerated the procedure without difficulty and was taken to the SICU in stable condition.  The patient was extubated the evening of surgery.  During her stay in the SICU she was weaned off NTG, Neo Synephrine and insulin drips as tolerated.  Her chest tubes and arterial lines were removed without difficulty.  She was hypervolemic and treated with diuretics.  She was felt medically stable and transferred to the step down unit on POD #2.  The patient continued to progress.  She was maintaining NSR and her pacing wires were removed without difficulty.  She was evaluated by PT who felt she would require home health at discharge.  Her blood sugars for the most part have been controlled and she is tolerating oral medications.  She will need close follow up with medical doctor after discharge. She is ambulating without difficulty and tolerating a carb modified diet.  She is felt surgically stable for discharge today.        Treatments: surgery:   Coronary artery bypass grafting x4 with left internal mammary to the left anterior descending coronary artery, reverse saphenous vein graft to the diagonal coronary artery, reverse saphenous vein graft to the obtuse marginal coronary artery, reverse saphenous vein graft  to the posterior descending coronary artery with right and left thigh, calf endovein harvesting.  Disposition: Home  Discharge Medications:    Medication List    STOP taking these medications        hydrochlorothiazide 25 MG tablet  Commonly known as:  HYDRODIURIL     naproxen 500 MG tablet  Commonly known as:  NAPROSYN     pravastatin 40 MG tablet  Commonly known as:   PRAVACHOL      TAKE these medications        acetaminophen 500 MG tablet  Commonly known as:  TYLENOL  Take 1,000 mg by mouth daily as needed for moderate pain (takes along with naprosyn).     aspirin 325 MG EC tablet  Take 1 tablet (325 mg total) by mouth daily.     atorvastatin 80 MG tablet  Commonly known as:  LIPITOR  Take 1 tablet (80 mg total) by mouth daily at 6 PM.     citalopram 40 MG tablet  Commonly known as:  CELEXA  Take 1 tablet (40 mg total) by mouth daily.     ferrous sulfate 325 (65 FE) MG tablet  Take 1 tablet (325 mg total) by mouth daily with breakfast. For one month then stop     folic acid 1 MG tablet  Commonly known as:  FOLVITE  Take 1 tablet (1 mg total) by mouth daily. For one month then stop     furosemide 40 MG tablet  Commonly known as:  LASIX  Take 1 tablet (40 mg total) by mouth daily. For one week then stop     gabapentin 600 MG tablet  Commonly known as:  NEURONTIN  Take 600 mg by mouth 2 (two) times daily.     Insulin Glargine 100 UNIT/ML Solostar Pen  Commonly known as:  LANTUS  Inject 35 Units into the skin 2 (two) times daily.     metFORMIN 500 MG tablet  Commonly known as:  GLUCOPHAGE  Take 1,000 mg by mouth 2 (two) times daily.     metoprolol tartrate 25 MG tablet  Commonly known as:  LOPRESSOR  Take 0.5 tablets (12.5 mg total) by mouth 2 (two) times daily.     nicotine 21 mg/24hr patch  Commonly known as:  NICODERM CQ - dosed in mg/24 hours  Place 1 patch (21 mg total) onto the skin daily.     oxyCODONE 5 MG immediate release tablet  Commonly known as:  Oxy IR/ROXICODONE  Take 1-2 tablets (5-10 mg total) by mouth every 3 (three) hours as needed for severe pain.     potassium chloride 10 MEQ tablet  Commonly known as:  K-DUR,KLOR-CON  Take 1 tablet (10 mEq total) by mouth daily.  Start taking on:  06/17/2014       The patient has been discharged on:   1.Beta Blocker:  Yes [ x  ]                              No   [    ]                              If No, reason:  2.Ace Inhibitor/ARB: Yes [   ]  No  [   x ]                                     If No, reason: Labile blood pressure  3.Statin:   Yes [ x ]                  No  [   ]                  If No, reason:  4.Ecasa:  Yes  [x   ]                  No   [   ]                  If No, reason:    Follow-up Information    Follow up with Melinda Isaac, Melinda [name] On 07/13/2014.   Specialty:  Cardiothoracic Surgery   Why:  PA/LAT CXR to be taken (at [facility] which is in the same building as Dr. Everrett Coombe office) on 3/14/206 12:00 pm;Appointmetn with Melinda [name] is at 1:00 pm   Contact information:   [place] [address] [phone]       Follow up with Melinda Faith, Melinda [name] On 06/24/2014.   Specialty:  Physician Assistant   Why:  Appointment is at 1:15   Contact information:   [place] [address] [phone]       Follow up with HARD, ERIC W, Melinda [name]   Specialty:  Internal Medicine   Why:  Call for follow up appointment regarding further surveillance of HGA1C 10.5 and diabetes management   Contact information:   [place] [address] [phone]       Signed: Nani Skillern Melinda [name] 06/13/2014, 11:28 AM

## 2014-06-12 NOTE — Progress Notes (Signed)
06/12/2014 08:30 PM Patient was asked if she would ambulate a little in the hallway and she declined. I asked if she was sure she did not want to walk and reminded her it would be good for her, and she declined again.  Will continue to monitor the patient and will ask to ambulate in the morning. Lupita Dawn

## 2014-06-12 NOTE — Progress Notes (Signed)
Remove bilateral pacing wires per order. Pt tolerated well. Vs obtained q15 min for 1hr. Pt instructed to remain on bedrest for 1hr. Monitoring will continue.

## 2014-06-12 NOTE — Discharge Instructions (Signed)
Coronary Artery Bypass Grafting, Care After °Refer to this sheet in the next few weeks. These instructions provide you with information on caring for yourself after your procedure. Your health care provider may also give you more specific instructions. Your treatment has been planned according to current medical practices, but problems sometimes occur. Call your health care provider if you have any problems or questions after your procedure. °WHAT TO EXPECT AFTER THE PROCEDURE °Recovery from surgery will be different for everyone. Some people feel well after 3 or 4 weeks, while for others it takes longer. After your procedure, it is typical to have the following: °· Nausea and a lack of appetite.   °· Constipation. °· Weakness and fatigue.   °· Depression or irritability.   °· Pain or discomfort at your incision site. °HOME CARE INSTRUCTIONS °· Take medicines only as directed by your health care provider. Do not stop taking medicines or start any new medicines without first checking with your health care provider. °· Take your pulse as directed by your health care provider. °· Perform deep breathing as directed by your health care provider. If you were given a device called an incentive spirometer, use it to practice deep breathing several times a day. Support your chest with a pillow or your arms when you take deep breaths or cough. °· Keep incision areas clean, dry, and protected. Remove or change any bandages (dressings) only as directed by your health care provider. You may have skin adhesive strips over the incision areas. Do not take the strips off. They will fall off on their own. °· Check incision areas daily for any swelling, redness, or drainage. °· If incisions were made in your legs, do the following: °¨ Avoid crossing your legs.   °¨ Avoid sitting for long periods of time. Change positions every 30 minutes.   °¨ Elevate your legs when you are sitting. °· Wear compression stockings as directed by your  health care provider. These stockings help keep blood clots from forming in your legs. °· Take showers once your health care provider approves. Until then, only take sponge baths. Pat incisions dry. Do not rub incisions with a washcloth or towel. Do not take baths, swim, or use a hot tub until your health care provider approves. °· Eat foods that are high in fiber, such as raw fruits and vegetables, whole grains, beans, and nuts. Meats should be lean cut. Avoid canned, processed, and fried foods. °· Drink enough fluid to keep your urine clear or pale yellow. °· Weigh yourself every day. This helps identify if you are retaining fluid that may make your heart and lungs work harder. °· Rest and limit activity as directed by your health care provider. You may be instructed to: °¨ Stop any activity at once if you have chest pain, shortness of breath, irregular heartbeats, or dizziness. Get help right away if you have any of these symptoms. °¨ Move around frequently for short periods or take short walks as directed by your health care provider. Increase your activities gradually. You may need physical therapy or cardiac rehabilitation to help strengthen your muscles and build your endurance. °¨ Avoid lifting, pushing, or pulling anything heavier than 10 lb (4.5 kg) for at least 6 weeks after surgery. °· Do not drive until your health care provider approves.  °· Ask your health care provider when you may return to work. °· Ask your health care provider when you may resume sexual activity. °· Keep all follow-up visits as directed by your health care   provider. This is important. °SEEK MEDICAL CARE IF: °· You have swelling, redness, increasing pain, or drainage at the site of an incision. °· You have a fever. °· You have swelling in your ankles or legs. °· You have pain in your legs.   °· You gain 2 or more pounds (0.9 kg) a day. °· You are nauseous or vomit. °· You have diarrhea.  °SEEK IMMEDIATE MEDICAL CARE IF: °· You have  chest pain that goes to your jaw or arms. °· You have shortness of breath.   °· You have a fast or irregular heartbeat.   °· You notice a "clicking" in your breastbone (sternum) when you move.   °· You have numbness or weakness in your arms or legs. °· You feel dizzy or light-headed.   °MAKE SURE YOU: °· Understand these instructions. °· Will watch your condition. °· Will get help right away if you are not doing well or get worse. °Document Released: 11/04/2004 Document Revised: 09/01/2013 Document Reviewed: 09/24/2012 °ExitCare® Patient Information ©2015 [employer]. This information is not intended to replace advice given to you by your health care provider. Make sure you discuss any questions you have with your health care provider. ° °Endoscopic Saphenous Vein Harvesting °Care After °Refer to this sheet in the next few weeks. These instructions provide you with information on caring for yourself after your procedure. Your health care provider may also give you more specific instructions. Your treatment has been planned according to current medical practices, but problems sometimes occur. Call your health care provider if you have any problems or questions after your procedure. °HOME CARE INSTRUCTIONS °Medicine °· Take whatever pain medicine your surgeon prescribes. Follow the directions carefully. Do not take over-the-counter pain medicine unless your surgeon says it is okay. Some pain medicine can cause bleeding problems for several weeks after surgery. °· Follow your surgeon's instructions about driving. You will probably not be permitted to drive after heart surgery. °· Take any medicines your surgeon prescribes. Any medicines you took before your heart surgery should be checked with your health care provider before you start taking them again. °Wound care °· If your surgeon has prescribed an elastic bandage or stocking, ask how long you should wear it. °· Check the area around your surgical cuts  (incisions) whenever your bandages (dressings) are changed. Look for any redness or swelling. °· You will need to return to have the stitches (sutures) or staples taken out. Ask your surgeon when to do that. °· Ask your surgeon when you can shower or bathe. °Activity °· Try to keep your legs raised when you are sitting. °· Do any exercises your health care providers have given you. These may include deep breathing exercises, coughing, walking, or other exercises. °SEEK MEDICAL CARE IF: °· You have any questions about your medicines. °· You have more leg pain, especially if your pain medicine stops working. °· New or growing bruises develop on your leg. °· Your leg swells, feels tight, or becomes red. °· You have numbness in your leg. °SEEK IMMEDIATE MEDICAL CARE IF: °· Your pain gets much worse. °· Blood or fluid leaks from any of the incisions. °· Your incisions become warm, swollen, or red. °· You have chest pain. °· You have trouble breathing. °· You have a fever. °· You have more pain near your leg incision. °MAKE SURE YOU: °· Understand these instructions. °· Will watch your condition. °· Will get help right away if you are not doing well or get worse. °Document Released: 12/28/2010   Document Revised: 04/22/2013 Document Reviewed: 12/28/2010 °ExitCare® Patient Information ©2015 [employer]. This information is not intended to replace advice given to you by your health care provider. Make sure you discuss any questions you have with your health care provider. ° ° °

## 2014-06-12 NOTE — Progress Notes (Signed)
UR Completed.  [phone]  

## 2014-06-13 LAB — GLUCOSE, CAPILLARY
Glucose-Capillary: 136 mg/dL — ABNORMAL HIGH (ref 70–99)
Glucose-Capillary: 150 mg/dL — ABNORMAL HIGH (ref 70–99)

## 2014-06-13 MED ORDER — CITALOPRAM HYDROBROMIDE 40 MG PO TABS: 40.0000 mg | ORAL_TABLET | Freq: Every day | ORAL | Status: DC

## 2014-06-13 MED ORDER — ASPIRIN 325 MG PO TBEC: 325.0000 mg | DELAYED_RELEASE_TABLET | Freq: Every day | ORAL | Status: DC

## 2014-06-13 MED ORDER — FUROSEMIDE 40 MG PO TABS: 40.0000 mg | ORAL_TABLET | Freq: Every day | ORAL | Status: DC

## 2014-06-13 MED ORDER — SIMETHICONE 80 MG PO CHEW
80.0000 mg | CHEWABLE_TABLET | Freq: Four times a day (QID) | ORAL | Status: DC | PRN
  Filled 2014-06-13 (×2): qty 1

## 2014-06-13 MED ORDER — FOLIC ACID 1 MG PO TABS: 1.0000 mg | ORAL_TABLET | Freq: Every day | ORAL | Status: DC

## 2014-06-13 MED ORDER — POTASSIUM CHLORIDE CRYS ER 10 MEQ PO TBCR: 10.0000 meq | EXTENDED_RELEASE_TABLET | Freq: Every day | ORAL | Status: DC

## 2014-06-13 MED ORDER — FERROUS SULFATE 325 (65 FE) MG PO TABS: 325.0000 mg | ORAL_TABLET | Freq: Every day | ORAL | Status: DC

## 2014-06-13 MED ORDER — METOPROLOL TARTRATE 25 MG PO TABS: 12.5000 mg | ORAL_TABLET | Freq: Two times a day (BID) | ORAL | Status: DC

## 2014-06-13 MED ORDER — NICOTINE 21 MG/24HR TD PT24: 21.0000 mg | MEDICATED_PATCH | Freq: Every day | TRANSDERMAL | Status: DC

## 2014-06-13 MED ORDER — OXYCODONE HCL 5 MG PO TABS: 5.0000 mg | ORAL_TABLET | ORAL | Status: DC | PRN

## 2014-06-13 MED ORDER — ATORVASTATIN CALCIUM 80 MG PO TABS: 80.0000 mg | ORAL_TABLET | Freq: Every day | ORAL | Status: DC

## 2014-06-13 MED ORDER — POTASSIUM CHLORIDE CRYS ER 20 MEQ PO TBCR: 10.0000 meq | EXTENDED_RELEASE_TABLET | Freq: Every day | ORAL | Status: DC

## 2014-06-13 NOTE — Progress Notes (Signed)
Pt c/o increased gas pains; PA made aware; new meds ordered.

## 2014-06-13 NOTE — Progress Notes (Signed)
Pt up to BR to have BM; encouraged pt to ambulate at this time; as pt walking out of bathroom, pt felt weak and stated she needed to sit down; vitals checked; all WNL; pt repositioned in chair; will attempt later to ambulate; will cont. To monitor.

## 2014-06-13 NOTE — Progress Notes (Signed)
Pt to d/c home today; pt wanting to wait and review d/c instructions when sister arrives; pt also wanting to speak with CM regarding medication assistance; CM notified; will cont. To monitor.

## 2014-06-13 NOTE — Progress Notes (Signed)
Chest tube sutures d/c at this time; steri strips applied; will cont. To monitor.

## 2014-06-13 NOTE — Progress Notes (Signed)
06/13/2014 6:54 AM  The patient would not ambulate this morning. She said she will not walk until after breakfast. Lupita Dawn

## 2014-06-13 NOTE — Progress Notes (Signed)
Pt and sister given d/c instructions; both verbalized understanding; prescriptions and follow up appointment information given; pt to d/c home with sister; will cont. To monitor.

## 2014-06-13 NOTE — Progress Notes (Signed)
CARDIAC REHAB PHASE I   PRE:  Rate/Rhythm: 83 SR  BP:  Supine:   Sitting: 124/59  Standing:    SaO2: 91% RA  MODE:  Ambulation: 200 ft   POST:  Rate/Rhythm: 100  BP:  Supine:   Sitting: 129/54  Standing:    SaO2: 92% RA  1058-1158 SaO2 prior to ambulation was 88%-91% RA, encouraged pursed-lip breathing.Pt tolerated ambulation fair with assist x1 and pushing a rolling walker. Gait slow, steady, one standing rest break taken. Pt c/o left breast pain, which resolved with seated rest in chair in room, informed patient's RN of pain. OHS d/c education completed with patient including deep breathing, coughing, IS use, sternal precautions, risk factor modification, daily weights, heart healthy/ diabetic diet given, and activity progression. Pt very receptive to information given and verbalizes understanding. Discussed phase 2 cardiac rehab and pt is interested. Permission given to send contact info to CR at [facility]  Scherrie Bateman

## 2014-06-13 NOTE — Progress Notes (Addendum)
      [address]       [place] [address]             [phone]        5 Days Post-Op Procedure(s) (LRB): CORONARY ARTERY BYPASS GRAFTING (CABG) (N/A) TRANSESOPHAGEAL ECHOCARDIOGRAM (TEE) (N/A)  Subjective: Patient feels fairly well. No specific complaints  Objective: Vital signs in last 24 hours: Temp:  [97.8 F (36.6 C)-98.3 F (36.8 C)] 98.2 F (36.8 C) (02/13 0507) Pulse Rate:  [68-88] 85 (02/13 0507) Cardiac Rhythm:  [-] Normal sinus rhythm (02/13 0731) Resp:  [17-18] 18 (02/13 0507) BP: (98-126)/(57-70) 98/57 mmHg (02/13 0507) SpO2:  [90 %-93 %] 92 % (02/13 0507) Weight:  [256 lb 9.9 oz (116.4 kg)] 256 lb 9.9 oz (116.4 kg) (02/13 0507)  Pre op weight 110 kg Current Weight  06/13/14 256 lb 9.9 oz (116.4 kg)      Intake/Output from previous day: 02/12 0701 - 02/13 0700 In: 1140 [P.O.:1140] Out: 1950 [Urine:1950]   Physical Exam:  Cardiovascular: RRR, no murmur Pulmonary: Diminished at bases R>L; no rales, wheezes, or rhonchi. Abdomen: Soft, non tender, bowel sounds present. Extremities: Mild bilateral lower extremity edema. Wounds: Clean and dry.  No erythema or signs of infection.  Lab Results: CBC: Recent Labs  06/11/14 0611  WBC 11.3*  HGB 8.2*  HCT 25.3*  PLT 278   BMET:  Recent Labs  06/11/14 0611  NA 137  K 4.8  CL 106  CO2 21  GLUCOSE 150*  BUN 14  CREATININE 0.92  CALCIUM 8.1*    PT/INR:  Lab Results  Component Value Date   INR 1.28 06/08/2014   INR 1.09 06/07/2014   INR 1.17 06/04/2014   ABG:  INR: Will add last result for INR, ABG once components are confirmed Will add last 4 CBG results once components are confirmed  Assessment/Plan:  1. CV - S/p NSTEMI. SR in the 80's. On Lopressor 12.5 bid. BP labile this am. Will not start ACE. 2.  Pulmonary - Encourage incentive spirometer 3. Volume Overload - On Lasix 40 daily 4.  Acute blood loss anemia - Last H and H 8.2 and 25.3. Continue ferrous  sulfate. 5. DM-CBGs  106/185/136. On Metformin and Insulin. Will resume pre op doses of medications. Pre op HGA1C 10.5. Needs follow up with medical doctor after discharge. 6. AS discussed with Dr. Cyndia Bent, discharge   Hailey Miles MPA-C 06/13/2014,8:35 AM

## 2014-06-14 NOTE — Care Management Note (Signed)
    Page 1 of 1   06/14/2014     8:57:52 AM CARE MANAGEMENT NOTE 06/14/2014  Patient:  Melinda [name], Melinda [name]   Account Number:  [id number]  Date Initiated:  06/09/2014  Documentation initiated by:  [facility]  Subjective/Objective Assessment:   Presented with NSTEMI - post op CABG x4  on 06-08-14     Action/Plan:   Anticipated DC Date:  06/12/2014   Anticipated DC Plan:  [place]  CM consult      Choice offered to / List presented to:             Status of service:  Completed, signed off Medicare Important Message given?   (If response is "NO", the following Medicare IM given date fields will be blank) Date Medicare IM given:   Medicare IM given by:   Date Additional Medicare IM given:   Additional Medicare IM given by:    Discharge Disposition:  HOME/SELF CARE  Per UR Regulation:  Reviewed for med. necessity/level of care/duration of stay  If discussed at [place] of Stay Meetings, dates discussed:   06/09/2014  06/11/2014    Comments:  06/13/14 13:00 Cm met with pt and sister, Debora and gave pt [place] letter with a list of participating pharmacies.  Pt's sister verbalized understanding of MATCH parameters.  CM also gave family a Scientist, research (physical sciences) Aide of No rth [employer] with contact number to call for a free appt with a navigator to get insurance.  Pt is going home with sister in [place] and states they will follow up in [place]. No other Cm needs were communicated.  Mariane Masters, [name], Cm [phone].  ContactLenard Simmer  [phone]  06-10-14 10am Luz Lex, RNBSN [phone] Patient awake and alert, lying in bed.  Sister in room. Talked with patient.  Lives at home alone.  When asked what plan for discharge they had, looked at sister, neither repsonded.  Informed them patient would need 24/7 assistance at home for 1-2 weeks, depending on progress. Sister spoke up and stated that with all the family  they could arrange that.  Will need to follow up to make sure they have a plan in effect.  CM will continue to follow for further needs.

## 2014-06-19 ENCOUNTER — Telehealth: Payer: Self-pay

## 2014-06-19 NOTE — Telephone Encounter (Signed)
Pt called states she was released from [facility] on Sat 06/13/2014 CABG, and states she has "oozing" from the bottom of her incision. States she is not in pain, but wants to make sure it is ok.

## 2014-06-19 NOTE — Telephone Encounter (Signed)
Patient needs to call her cardiothoracic surgeon.

## 2014-06-19 NOTE — Telephone Encounter (Signed)
Pt called states she was released from [facility] on Sat 06/13/2014 CABG, and states she has "oozing" from the bottom of her incision. States she is not in pain, but wants to make sure it is ok. Discharge is light red/orange  She stated it is not a large amount but iti is wetting her bandages  She stated incision looks normal (no swelling or puss)

## 2014-06-19 NOTE — Telephone Encounter (Signed)
Informed patient of Darral Dash response  Patient verbalized understanding

## 2014-06-24 ENCOUNTER — Other Ambulatory Visit: Payer: Self-pay | Admitting: Physician Assistant

## 2014-06-24 ENCOUNTER — Encounter: Payer: Self-pay | Admitting: Physician Assistant

## 2014-06-24 ENCOUNTER — Ambulatory Visit (INDEPENDENT_AMBULATORY_CARE_PROVIDER_SITE_OTHER): Payer: Medicaid Other | Admitting: Physician Assistant

## 2014-06-24 VITALS — BP 136/78 | HR 87 | Ht 68.0 in | Wt 240.5 lb

## 2014-06-24 DIAGNOSIS — I214 Non-ST elevation (NSTEMI) myocardial infarction: Secondary | ICD-10-CM

## 2014-06-24 DIAGNOSIS — Z9889 Other specified postprocedural states: Secondary | ICD-10-CM

## 2014-06-24 DIAGNOSIS — Z951 Presence of aortocoronary bypass graft: Secondary | ICD-10-CM | POA: Diagnosis not present

## 2014-06-24 DIAGNOSIS — Z794 Long term (current) use of insulin: Secondary | ICD-10-CM

## 2014-06-24 DIAGNOSIS — Z720 Tobacco use: Secondary | ICD-10-CM

## 2014-06-24 DIAGNOSIS — IMO0001 Reserved for inherently not codable concepts without codable children: Secondary | ICD-10-CM

## 2014-06-24 DIAGNOSIS — E119 Type 2 diabetes mellitus without complications: Secondary | ICD-10-CM

## 2014-06-24 DIAGNOSIS — G629 Polyneuropathy, unspecified: Secondary | ICD-10-CM

## 2014-06-24 DIAGNOSIS — E669 Obesity, unspecified: Secondary | ICD-10-CM | POA: Insufficient documentation

## 2014-06-24 DIAGNOSIS — I255 Ischemic cardiomyopathy: Secondary | ICD-10-CM

## 2014-06-24 DIAGNOSIS — I10 Essential (primary) hypertension: Secondary | ICD-10-CM

## 2014-06-24 DIAGNOSIS — I2510 Atherosclerotic heart disease of native coronary artery without angina pectoris: Secondary | ICD-10-CM

## 2014-06-24 MED ORDER — LISINOPRIL 2.5 MG PO TABS: 2.5000 mg | ORAL_TABLET | Freq: Every day | ORAL | Status: DC

## 2014-06-24 MED ORDER — METOPROLOL TARTRATE 25 MG PO TABS: 25.0000 mg | ORAL_TABLET | Freq: Two times a day (BID) | ORAL | Status: DC

## 2014-06-24 MED ORDER — CLOPIDOGREL BISULFATE 75 MG PO TABS: 75.0000 mg | ORAL_TABLET | Freq: Every day | ORAL | Status: DC

## 2014-06-24 MED ORDER — ASPIRIN EC 81 MG PO TBEC: 81.0000 mg | DELAYED_RELEASE_TABLET | Freq: Every day | ORAL | Status: DC

## 2014-06-24 NOTE — Progress Notes (Signed)
Patient Name: Melinda [name], [birth year]born Feb 27, 1956, MRN [id number]  Date of Encounter: 06/24/2014  Primary Care Provider:  Georgena Spurling, MD Primary Cardiologist:  Dr. Rockey Situ, MD  Chief Complaint  Patient presents with  . other    F/u from [facility] c/o soreness. Meds reviewed verbally with pt.    HPI:  59 y.o. female with h/o CAD s/p 4 vessel CABG on 06/08/2014 secondary to NSTEMI, IDDM, HTN, ongoing tobacco abuse, obesity, anxiety, and peripheral neuropathy who was recently admitted to [facility] from 2/1-2/3 for NSTEMI. After undergoing cardiac cath on 2/3 that showed severe 3 vessel disease she was transferred to [facility] from 2/3-2/13 for CABG.    Prior to the above she denied any prior known cardiac history. No prior stress tests or cardiac caths. She had not felt well since December 2015. At that time her metformin was increased from 500 mg bid to 1000 mg bid. Since then she has dealt with increased abdominal cramping, diarrhea, and fatigue. Her abdominal discomfort was typically epigastric, but could also be lower abdominal. She also noted some significant reflux. She continued on with her daily routine as best she could.   She presented to [facility] ED on 2/1 with headache, neck pain, nasal pain, chest tightness, nausea, and just overall did not feel well. Work up in the [facility] ED confirmed sinus infection, which she started on Augmentin for. She was also found to be hypotensive with BP 85/51. She received IV fluids with improvement in her BP to 120s/80s. Negative abdominal CT. She did have an elevated troponin at <0.02->1.30-->3.60-->7.80-->13.26-->23.00-->30.00. She was placed on a heparin gtt. Cardiac cath showed 95% stenosis mLAD, occlusion ostial OM1, 70% stenosis LCx, 95% stenosis mRCA, EF 45%. She underwent successful 4 vessel CABG (LIMA to LAD, SVG to Diagonal, SVG to OM, and SVG to PDA) on 06/08/2014 at [facility]. She tolerated this procedure well. Echo at [facility] showed EF 40-45%, HK of entire inferior and inferolateral  wall c/w infarction of RCA or LCx, GR2DD, and mild TR. She was discharged on Lopressor 12.5 mg bid with labile BP. She was not started on an ACEi 2/2 labile BP. Hgb was stable at discharge. She was continued on Lasix 40 mg daily. She was advised to follow up with her PCP for her blood sugars (pre op A1C 10.5%).   She comes in today stating she feels well, just sore from her surgery. She is advancing her activity as tolerated. Taking all medications as directed. She has not smoked a cigarette in 1 month. She is using her incentive spirometer "when I remember to." No angina, SOB, palpitations, edema, nausea, vomiting, presyncope, or syncope.       Past Medical History  Diagnosis Date  . HTN (hypertension)   . Tobacco abuse   . Anxiety   . Peripheral neuropathy   . Obesity   . Coronary artery disease     a. NSTEMI 06/2014; b. 4 vessel CABG 06/08/2014 LIMA to LAD, SVG to Diagonal, SVG to OM, & SVG to PDA  . Ischemic cardiomyopathy     a. echo 06/04/14 EF 40-45%, HK of entire inferolateral and inferior myocardium c.w infarct of RCA/LCx, GR2DD, mild MR  . IDDM (insulin dependent diabetes mellitus)   . GERD (gastroesophageal reflux disease)   . OA (osteoarthritis) of knee   : Past Surgical History  Procedure Laterality Date  . Breast biopsy    . Toe amputation Right 12/2011    rt middle toe  . Cardiac catheterization  06/2014    95% stenosis mLAD, occlusion ostial OM1, 70% stenosis LCx, 95% stenosis mRCA, EF 45%.  . US echocardiography  06/2014    EF 50-55%, HK of inf/post/inferolat walls, Ao sclerosis  . Ct abd w & pelvis wo cm  06/2014    nl liver, gallbladder, spleen, mild diverticular changes, no bowel wall inflammation, appendix nl, no hernia, no other sig abnormalities  . Coronary artery bypass graft N/A 06/08/2014    Procedure: CORONARY ARTERY BYPASS GRAFTING (CABG);  Surgeon: Grace Isaac, MD;  Location: [place];  Service: Open Heart Surgery;  Laterality: N/A;  Times 4 using left  internal mammary artery to LAD artery and endoscopically harvested bilateral saphenous vein to Obtuse Marginal, Diagonal and Posterior Descending coronary arteries.  Darden Dates without cardioversion N/A 06/08/2014    Procedure: TRANSESOPHAGEAL ECHOCARDIOGRAM (TEE);  Surgeon: Grace Isaac, MD;  Location: [place];  Service: Open Heart Surgery;  Laterality: N/A;  : Family History  Problem Relation Age of Onset  . CAD Mother   . Hypertension Mother   . CAD Father   :  reports that she has been smoking Cigarettes.  She has a 40 pack-year smoking history. She has never used smokeless tobacco. She reports that she does not drink alcohol or use illicit drugs.:   Allergies:  No Known Allergies   Home Medications:  Current Outpatient Prescriptions  Medication Sig Dispense Refill  . acetaminophen (TYLENOL) 500 MG tablet Take 1,000 mg by mouth.    Marland Kitchen atorvastatin (LIPITOR) 80 MG tablet Take 1 tablet (80 mg total) by mouth daily at 6 PM. 30 tablet 1  . ferrous sulfate 325 (65 FE) MG tablet Take 1 tablet (325 mg total) by mouth daily with breakfast. For one month then stop  3  . folic acid (FOLVITE) 1 MG tablet Take 1 tablet (1 mg total) by mouth daily. For one month then stop    . gabapentin (NEURONTIN) 600 MG tablet Take 600 mg by mouth 2 (two) times daily.    . Insulin Glargine (LANTUS) 100 UNIT/ML Solostar Pen Inject 35 Units into the skin 2 (two) times daily.    . metFORMIN (GLUCOPHAGE) 500 MG tablet Take 1,000 mg by mouth 2 (two) times daily.    Marland Kitchen oxyCODONE (OXY IR/ROXICODONE) 5 MG immediate release tablet Take 1-2 tablets (5-10 mg total) by mouth every 3 (three) hours as needed for severe pain. 30 tablet 0  . aspirin EC 81 MG tablet Take 1 tablet (81 mg total) by mouth daily. 90 tablet 3  . clopidogrel (PLAVIX) 75 MG tablet Take 1 tablet (75 mg total) by mouth daily. 90 tablet 2  . lisinopril (PRINIVIL,ZESTRIL) 2.5 MG tablet Take 1 tablet (2.5 mg total) by mouth daily. 30 tablet 6  . metoprolol  tartrate (LOPRESSOR) 25 MG tablet Take 1 tablet (25 mg total) by mouth 2 (two) times daily. 60 tablet 6   No current facility-administered medications for this visit.    Weights: Wt Readings from Last 3 Encounters:  06/24/14 240 lb 8 oz (109.09 kg)  06/13/14 256 lb 9.9 oz (116.4 kg)     Review of Systems:  As above. All other systems reviewed and are otherwise negative except as noted above.  Physical Exam:  Blood pressure 136/78, pulse 87, height 5\' 8"  (1.727 m), weight 240 lb 8 oz (109.09 kg).  General: Pleasant, NAD Psych: Normal affect. Neuro: Alert and oriented X 3. Moves all extremities spontaneously. HEENT: Normal  Neck: Supple without bruits  or JVD. Lungs:  Resp regular and unlabored, CTA. Heart: RRR no s3, s4, or murmurs. Anterior chest wall surgical site healing well. No signs of infection.  Abdomen: Soft, non-tender, non-distended, BS + x 4.  Extremities: No clubbing, cyanosis or edema. DP/PT/Radials 2+ and equal bilaterally.   Accessory Clinical Findings:  EKG - NSR, 87 bpm, left axis deviation, poor R wave progression, TWI II, III, aVF, V2-V6  Other studies Reviewed: Additional studies/ records that were reviewed today include: [facility] records.  Recent Labs: 06/04/2014: TSH 1.002 06/05/2014: ALT 25 06/09/2014: Magnesium 2.3 06/11/2014: BUN 14; Creatinine 0.92; Hemoglobin 8.2*; Platelets 278; Potassium 4.8; Sodium 137    Lipid Panel TC 94, LDL 31, HDL 39, TG 122  Assessment & Plan:  1. CAD s/p 4 vessel CABG: -Add Plavix 75 mg daily out to 9 months per Cure trial  -Decrease aspirin to 81 mg daily  -Continue Lipitor 80 mg daily -Start cardiac rehab   2. ICM: -Add lisinopril 2.5 mg daily -Increase Lopressor to 25 mg bid -She does not appear to be volume overloaded today  3. IDDM: -She reports improved home glucose levels since her discharge -Continue current management per PCP -Follow up with PCP as already scheduled   4. Tobacco  abuse: -Reports not smoking x 1 month -Continued smoking cessation is a must -Counseled for greater than 5 minutes   5. HTN: -Medication adjustments per above  6. Obesity: -Cardiac rehab per above  7. Peripheral neuropathy: -She was more concerned about this today than her heart -Offered to titrate her gabapentin until she could see her PCP, she declined -Follow up with PCP   Dispo: -Follow up [place] 3 months -Follow up [facility]TS 3/18 -Follow up PCP 3/8  Christell Faith, [name] [address] [place], [place] [address] [phone] [facility] 06/24/2014, 2:56 PM

## 2014-06-24 NOTE — Patient Instructions (Signed)
Your physician has recommended you make the following change in your medication:  Increase Lopressor to 25 mg twice daily  Start Lisinopril 2.5 mg once daily   You have been referred to cardiac rehab. They will contact you to set up appointment.   Your physician recommends that you have labs today:  BMP   Your physician recommends that you schedule a follow-up appointment in:  3 months

## 2014-06-25 ENCOUNTER — Telehealth: Payer: Self-pay | Admitting: Physician Assistant

## 2014-06-25 NOTE — Telephone Encounter (Signed)
Spoke with patient. Advised her of her labs results. Made sure she understood to not take the full dose aspirin along with her Plavix and to take aspirin 81 mg daily. She did understand this. She will follow up as planned.

## 2014-07-06 ENCOUNTER — Other Ambulatory Visit: Payer: Self-pay | Admitting: Cardiothoracic Surgery

## 2014-07-06 DIAGNOSIS — Z951 Presence of aortocoronary bypass graft: Secondary | ICD-10-CM

## 2014-07-07 DIAGNOSIS — I255 Ischemic cardiomyopathy: Secondary | ICD-10-CM | POA: Insufficient documentation

## 2014-07-13 ENCOUNTER — Ambulatory Visit
Admission: RE | Admit: 2014-07-13 | Discharge: 2014-07-13 | Disposition: A | Discharge status: Home / Self Care | Payer: No Typology Code available for payment source | Source: Ambulatory Visit | Attending: Cardiothoracic Surgery | Admitting: Cardiothoracic Surgery

## 2014-07-13 ENCOUNTER — Ambulatory Visit (INDEPENDENT_AMBULATORY_CARE_PROVIDER_SITE_OTHER): Payer: Self-pay | Admitting: Physician Assistant

## 2014-07-13 VITALS — BP 111/65 | HR 88 | Resp 20 | Ht 68.0 in

## 2014-07-13 DIAGNOSIS — Z951 Presence of aortocoronary bypass graft: Secondary | ICD-10-CM

## 2014-07-13 MED ORDER — OXYCODONE HCL 5 MG PO TABS: 5.0000 mg | ORAL_TABLET | Freq: Four times a day (QID) | ORAL | Status: DC | PRN

## 2014-07-13 NOTE — Progress Notes (Signed)
HPI:  Patient returns for routine postoperative follow-up having undergone CABG x 4 on 06/08/2014. The patient's early postoperative recovery while in the hospital was unremarkable. Since hospital discharge the patient reports that she is doing okay.  She continues to have post operative pain along her neck, shoulders, and back.  She is requesting a refill on pain medication for this and does question why she is continuing to have pain.  She also states she has some lower extremity edema present for which her PCP gave her Lasix.  She also is experiencing some dizziness.  She also complains of numbness in both of her hands which she states was present prior to surgery, but has gotten somewhat worse.  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  This is only for 3-4 hours day.  She also states she has filed for permanent disability and questions when this should be approved.     Current Outpatient Prescriptions  Medication Sig Dispense Refill  . acetaminophen (TYLENOL) 500 MG tablet Take 1,000 mg by mouth every 8 (eight) hours as needed.     Marland Kitchen aspirin EC 81 MG tablet Take 1 tablet (81 mg total) by mouth daily. 90 tablet 3  . atorvastatin (LIPITOR) 80 MG tablet Take 1 tablet (80 mg total) by mouth daily at 6 PM. 30 tablet 1  . clopidogrel (PLAVIX) 75 MG tablet Take 1 tablet (75 mg total) by mouth daily. 90 tablet 2  . furosemide (LASIX) 40 MG tablet Take 40 mg by mouth daily.    Marland Kitchen gabapentin (NEURONTIN) 600 MG tablet Take 800 mg by mouth 3 (three) times daily.     . Insulin Glargine (LANTUS) 100 UNIT/ML Solostar Pen Inject 35 Units into the skin 2 (two) times daily.    Marland Kitchen lisinopril (PRINIVIL,ZESTRIL) 2.5 MG tablet Take 1 tablet (2.5 mg total) by mouth daily. (Patient taking differently: Take 2.5 mg by mouth daily. 1/2 TABLET DAILY) 30 tablet 6  . metFORMIN (GLUCOPHAGE) 500 MG tablet Take 1,000 mg by mouth 2 (two) times daily.    . metoprolol tartrate  (LOPRESSOR) 25 MG tablet Take 1 tablet (25 mg total) by mouth 2 (two) times daily. 60 tablet 6  . oxyCODONE (OXY IR/ROXICODONE) 5 MG immediate release tablet Take 1-2 tablets (5-10 mg total) by mouth every 6 (six) hours as needed for severe pain. 20 tablet 0   No current facility-administered medications for this visit.    Physical Exam:  BP 111/65 mmHg  Pulse 88  Resp 20  Ht 5\' 8"  (1.727 m)  SpO2 93%  Gen: no apparent distress Heart: RRR Lungs: CTA bilaterally Skin: Incisions clean and dry Ext: no swelling appreciated.  Diagnostic Tests:  CXR: normal post operative CXR, sternal wires intact, no pleural effusions present   1. S/P CABG doing well 2. Post Operative pain-explained to patient likely due to positioning during surgery and from retraction of chest.  She was given a refill on her narcotic pain medication and instructed to use for only severe pain.  Oxy IR 1 tablet every 6 hours prn pain.  She was also instructed to trial Ibuprofen which can help with inflammation from the surgery 3. Dizziness- on exam today no LE edema present.  I explained to the patient that this is most likely due to being dry.  I instructed the patient to reduce her Lasix dose to 20 mg daily and if no edema is present she would  not need to take her Lasix 4. Return to work- I gave patient permission to return to work for 3-4 hours per day to do paperwork.  She will remain on lifting restrictions and strenuous activity restrictions for several more weeks 5. Disability request- in regards to this I explained to the patient that she is not disabled from a heart standpoint.  I explained that she would likely need to give the paperwork to her primary physician.  She was unhappy about this and states that she has been disabled since prior to the surgery.  I again explained that her heart has been fixed and she would be better served to give her paperwork to the PCP 6. RTC in 4-6 weeks with Dr. Servando Snare to further  assess RTW  Ellwood Handler, [name] [facility] [phone]

## 2014-07-23 ENCOUNTER — Telehealth: Payer: Self-pay | Admitting: *Deleted

## 2014-07-23 NOTE — Telephone Encounter (Signed)
Cardiac rehab orders faxed.

## 2014-08-18 NOTE — Op Note (Signed)
PATIENT NAME:  Melinda [name], FORT MR#:  [id number] DATE OF BIRTH:  [birth year]  DATE OF PROCEDURE:  01/20/2012  PREOPERATIVE DIAGNOSIS: Osteomyelitis, right third toe.   POSTOPERATIVE DIAGNOSIS: Osteomyelitis, right third toe.   PROCEDURE: Partial amputation, right third toe.   SURGEON: Sharlotte Alamo, DPM  ANESTHESIA: Local MAC.   HEMOSTASIS: Pneumatic tourniquet to right ankle, 250 mmHg.   ESTIMATED BLOOD LOSS: Minimal.   PATHOLOGY: Distal right third toe.   DRAINS: None.   COMPLICATIONS: None apparent.   OPERATIVE INDICATIONS: This is a 59 year old female with recent development of swelling and redness in her right third toe. Osteomyelitis was detected radiographically and decision made for amputation of distal aspect of the right third toe.   OPERATIVE PROCEDURE: The patient was taken to the Operating Room and placed on the table in the supine position. Following satisfactory sedation, the right foot was anesthetized with 9 mL of 0.5% Sensorcaine plain around the third metatarsal and base of the second toe. A pneumatic tourniquet was applied at the level to the right ankle and the foot was prepped and draped in the usual sterile fashion. The foot was exsanguinated and the tourniquet inflated to 250 mmHg.   Attention was then directed to the distal aspect of the right foot where a fishmouth-type incision was made coursing medial to lateral over the middle portion of the third toe. The incision was carried sharply down to bone and periosteal dissection carried back to the level of the proximal interphalangeal joint where the toe was disarticulated and removed in toto. Good healthy tissues were noted. The wound was flushed with copious amounts of sterile saline and closed using 4-0 nylon simple interrupted sutures. Xeroform and a sterile bandage were applied. Tourniquet was released and blood flow was noted to return immediately to the right foot and the remaining digits. The patient tolerated the  procedure and anesthesia well and was transported to postanesthesia care unit with vital signs stable and in good condition.  ____________________________ Sharlotte Alamo, DPM tc:slb D: 01/20/2012 09:06:41 ET T: 01/20/2012 12:07:50 ET JOB#: [id number]  cc: Sharlotte Alamo, DPM, <Dictator> Alyx Mcguirk DPM ELECTRONICALLY SIGNED 01/21/2012 18:29

## 2014-08-18 NOTE — Consult Note (Signed)
PATIENT NAME:  Melinda [name], Melinda [name] MR#:  [id number] DATE OF BIRTH:  Sep 09, 1955  DATE OF CONSULTATION:  02/02/2012  REFERRING PHYSICIAN:  Sharlotte Alamo, DPM CONSULTING PHYSICIAN:  [place] Sink, MD  REASON FOR CONSULTATION: Preoperative management, operative clearance, and management of chronic medical conditions.   REASON FOR ADMISSION: Infected stump of right third toe status post partial amputation.   HISTORY OF PRESENT ILLNESS: Melinda [name] is a very nice 59 year old female who has history of diabetes type 2 which has been uncontrolled. On past admission, the patient had a hemoglobin A1c of 13 and so far she states that her blood sugar is starting to get under better control. The last time that she was admitted on 01/19/2012, she came due to discoloration and swelling of the right foot, especially at the level of the third toe. She has been wearing inappropriate shoes without socks and not taking good care of her feet despite the fact that she has significant neuropathy. The patient was admitted and underwent partial amputation of the third toe, of the right lower extremity. After that she was discharged in good condition and was treated with ciprofloxacin and Augmentin. She continues to have significant drainage on the stump and significant edema, swelling, and occasional pain. For that reason, Dr. Cleda Mccreedy decided to readmit her today to complete a full amputation of the toe. In talking with the patient, the patient states that overall she has made a big change in her lifestyle, eating better, less quantities of food. She still eats carbs but not as much at previously. Her blood sugars used to be in 250 to 300s. Now she states that the highest has been 145 at home. This morning she states that her blood sugar has been 92 and overall fasting glucose stays around 100. The patient has hypertension and she has been taking lisinopril. Her blood pressure is not really elevated and overall stays below 440,  systolic, as far as she recalls. The patient states that she is not very active. She does a lot of things around the house, but she cannot exercise and this is due to her bilateral knee osteoarthritis.  It is difficult to evaluate her capacity for exercise due to this problem since she is not able to walk even around the store. She states that she never gets short of breath and she does not have chest pain. 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 I am unable to calculate how much met/S the patient can do due to this limitation of exercise. At this moment, the type of surgery the patient is going to undergo is very low risk and she has had the same surgery prior for what I think her risk for surgery remains low. In spite of that, she has not had an EKG or chest x-ray prior to surgery, so we are going to obtain one to be 100% sure of the clearance.   REVIEW OF SYSTEMS: A 12 system review of systems CONSTITUTIONAL: The patient denies any weight loss or weight gain. Denies any profuse sweating or weakness. ENT: The patient states that she has a little bit of blurry vision due to cataracts. Her visual activity has not changed significantly within the last couple of years. She denies any erythema of the eye, swelling of the eyes, and denies any difficulty swallowing or oral lesions. She  denies any neck lesions. CARDIOPULMONARY: Denies any chest pain, shortness of breath, orthopnea, or persistent nocturnal dyspnea recently, no palpitations, no significant leg edema. She denies any congestive heart failure or coronary artery disease. She denies any shortness of breath, cough, or hemoptysis. GASTROINTESTINAL: Denies any difficulty swallowing, melena, or hematochezia. The patient has been constipated due to the use of narcotics for pain control, even though it is a small dose.  GENITOURINARY: Denies any dysuria, hematuria, or kidney stones. HEME/LYMPH: Denies any bleeding or swollen glands. No easy bruising. ENDOCRINE: No polyuria, polydipsia, or polyphagia. Her blood sugar has been very controlled. No cold or heat intolerance. No thyroid problems. MUSCULOSKELETAL: Positive bilateral knee pain. NEUROLOGIC: Positive for neuropathy of the lower extremities with decreased sensation, burning sensation of the feet at night, pins and needles in the feet at night, and decreased sensation on microfilament test. PSYCH: Negative for depression or anxiety at this moment. LYMPHATICS: Negative for lymphadenopathy.   PAST MEDICAL HISTORY:  1. Type 2 diabetes, insulin-dependent, with last Hemoglobin A1c around 13. 2. Tobacco abuse.  3. 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  4. Diabetic neuropathy.  5. Osteomyelitis of theRight third toe.   PAST SURGICAL HISTORY:  1. Breast biopsy due to a benign lump.  2. Amputation of third distal phalange of the right lower extremity.  MEDICATIONS: 1. Lantus 30 units in the morning.  2. NovoLog sliding scale. 3. Lisinopril 5 mg a day. 4. Hydrocodone p.r.n. pain.   SOCIAL HISTORY: The patient is a widow and lives at home. 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 As mentioned above, she smokes more than a pack a day since age 22. She denies any alcohol use. She used to abuse cocaine but nothing since the last couple of years. There is use of marijuana, but not recently.  FAMILY HISTORY: Diabetes in both parents. No coronary artery disease.  DRUG ALLERGIES: No known drug allergies.   PHYSICAL EXAMINATION:   VITAL SIGNS: Blood pressure 129/78, pulse 83, respiratory 18, and temperature 98.5.   GENERAL: The patient is alert and oriented x3. No acute respiratory distress, hemodynamically stable. Comfortable in bed and cooperative.   HEENT: Pupils are equal and reactive. Extraocular movements are intact. Mucosa moist.  No oral lesions. No oropharyngeal exudates. Normocephalic, atraumatic. Anicteric sclerae. Pink conjunctivae.   NECK: Supple. No JVD. No thyromegaly. No adenopathy. No carotid bruits are auscultated at this moment. Range of motion is normal.   CARDIOVASCULAR: Regular rate and rhythm. No murmurs, rubs, or gallops are appreciated. Apical impulse is located on midclavicular line.   PULMONARY: Lungs are clear without any wheezing or crepitus. No use of accessory muscles. No dullness to percussion.   ABDOMEN: Soft, nontender, and nondistended. No hepatosplenomegaly. No masses. Bowel sounds are positive. No vascular spiders observed.   GENITAL: Deferred.   EXTREMITIES: No edema, no cyanosis, and no clubbing. There is a wrap on the right toe. She does have right toe amputation. No significant swelling. Pulses +2. Capillary refill less than 3.   LYMPHATIC: Negative for lymphadenopathy in neck or supraclavicular.   SKIN: No significant rashes or petechiae.   PSYCH: Mood is normal without any signs of depression.   NEUROLOGIC: Cranial nerves II through XII intact. Strength is five out of five in all four extremities. No focal abnormalities. Decreased sensation on the bottom of the feet due to neuropathy.   MUSCULOSKELETAL: Negative for joint effusions.  RESULTS: Hemoglobin A1c 13. Glucose today is 166,  creatinine 1, sodium 141, and potassium 4.3. White count 8.7, hemoglobin 13.4, and platelets 245.  EKG is pending.  X-rays are pending.   ASSESSMENT AND PLAN:  1. Osteomyelitis of the right third toe. The patient is admitted for completion of surgery. In the past, she had distal phalangeal removal and right now she has continuation of the infection for what she is going to undergo amputation of the whole toe. At this moment, she is stable, her risk of surgery is minimal, and due to the fact that it is a simple surgery and the patient does not have any significant diagnosis of coronary artery  disease, we are going to get an EKG just to make sure and a chest x-ray we are going to be reviewing shortly. The patient is mostly clear for surgery at this moment.  2. Diabetes. The patient has history of diabetes with a Hemoglobin A1c of 13. Her blood sugar has been in good control after the patient initiated a better diet and changes in lifestyle. The patient is going to be treated with insulin sliding scale right now, since she is going to be n.p.o., and we are going to treat blood sugars only above 200 to prevent any risk of hypoglycemia. We are going to change that management after surgery whenever the patient starts a diet. At that moment, we will continue basal insulin as well. As far as hypertension, the patient does not have true hypertension, although for her diabetes her goal is to be less than 130, less than 85. She is started on lisinopril also for kidney protection reasons. Follow-up proteinuria and microalbumin as an outpatient.  3. Tobacco abuse. The patient is a heavy smoker. Right now she is using nicotine electronic cigars. We had a conversation about nicotine use and smoking cessation for five minutes today, counseling given.      CODE STATUS: FULL CODE.   Thank you Dr. Cleda Mccreedy for allowing me to participate in the care of your patient.   TIME SPENT: I spent about 45 minutes with this patient today.  ____________________________ [place] Sink, MD rsg:slb D: 02/02/2012 14:01:57 ET T: 02/02/2012 14:57:33 ET JOB#: [id number]  cc: [place] Sink, MD, <Dictator> Sharlotte Alamo, DPM Osualdo Hansell America Brown MD ELECTRONICALLY SIGNED 02/02/2012 16:33

## 2014-08-18 NOTE — Consult Note (Signed)
PATIENT NAME:  Melinda [name], ALONZO MR#:  [id number] DATE OF BIRTH:  08/03/1955  DATE OF CONSULTATION:  01/19/2012  CONSULTING PHYSICIAN:  Sharlotte Alamo, DPM  REASON FOR CONSULTATION: The patient is a 59 year old female recently admitted for cellulitis in both feet regarding a couple of toes. She states she has had a sore on the tip of her right third toe for several weeks. She denies any specific injury. She does have a history of diabetes.   PAST MEDICAL HISTORY:  1. Diabetes mellitus, type 2, insulin-requiring.  2. Tobacco abuse.  3. Past history of cocaine and marijuana abuse.   PAST SURGICAL HISTORY: Breast biopsy.   MEDICATIONS:  1. Lantus 30 units in the morning.  2. NovoLog sliding scale.   ALLERGIES: No known drug allergies.   FAMILY HISTORY: Diabetes mellitus in both parents.   SOCIAL HISTORY: She is widowed and lives at home. 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 She smokes 1 pack of cigarettes per day. She denies alcohol use. She has had a history of cocaine and marijuana abuse but none recently.   REVIEW OF SYSTEMS: The patient denies any fever or chills. She has had some redness and swelling in the right third toe as well as the left second toe. No distinct injury. She does have some numbness and paresthesias in the feet from her diabetes. She denies any cough or shortness of breath. No stomach pain or heartburn. She denies any dysurias or incontinence. No hearing or vision changes.   PHYSICAL EXAMINATION:  VASCULAR: DP and PT pulses are palpable. Capillary filling time appears intact.   NEUROLOGICAL: Some loss of protective threshold with a monofilament wire on the left foot to the level of the mid foot area. Right still appears to be intact. Proprioception appears to be intact bilateral.   INTEGUMENT: Skin is warm, dry, and supple. There is significant edema with some erythema in the right third toe distally as well as some at the PIPJ region on the left  second toe. A distal lesion on the right third toe with underlying full thickness ulceration which probes directly down to bone. A large amount of purulent drainage was encountered upon debridement. No evidence of any open lesion on the left second toe. Toenails are thick, dystrophic and discolored, brittle x10.   MUSCULOSKELETAL: Adequate range of motion of the pedal joints. There is some instability on range of motion at the second toe on the left at the PIPJ. Some hammertoe contractures are noted.   X-RAYS: Multiple views of both feet reveal some small fracture fragments around the second toe PIPJ on the left. There does not appear to be any clear cortical erosions. On the right there is significant erosion of the distal phalanx on the right third toe consistent with osteomyelitis. No evidence of any subcutaneous emphysema or calcified blood vessels.   LABORATORY DATA: Admission labs revealed elevated white count at 12,000. Hemoglobin A1c is 13.5.   IMPRESSION:  1. Poorly controlled diabetes with some degree of neuropathy.  2. Osteomyelitis distal phalanx right third toe.  3. Fracture left second toe.   PLAN: I debrided the ulceration on the distal tip of the right third toe. A culture was taken for Gram stain and sensitivities. A bandage was applied. I discussed with the patient the need for amputation of the distal aspect of the third toe. We will plan on this for tomorrow morning. We will have a consent form to be signed. She will  be n.p.o. after midnight. Plan for surgery tomorrow morning.  ____________________________ Sharlotte Alamo, DPM tc:cbb D: 01/19/2012 12:16:57 ET T: 01/19/2012 13:00:15 ET JOB#: [id number] Armida Vickroy DPM ELECTRONICALLY SIGNED 01/21/2012 18:29

## 2014-08-18 NOTE — H&P (Signed)
PATIENT NAME:  Melinda [name], Melinda [name] MR#:  [id number] DATE OF BIRTH:  May 06, 1955  DATE OF ADMISSION:  02/02/2012  HISTORY OF PRESENT ILLNESS: This is a 59 year old female with recent admission a couple of weeks ago for an infection in her right third toe. Patient was uncontrolled diabetic at the time. Patient underwent distal amputation of the right third toe. Following her discharge was noted to have some necrosis and continued infection and drainage from the toe which did not resolve. Decision made for readmission to the hospital and amputation of the remainder of her right third toe.   PAST MEDICAL HISTORY:  1. Insulin-requiring diabetes, poorly controlled.  2. Diabetic related neuropathy.  3. Hypertension.  4. Depression.  5. Arthritis.   PAST SURGICAL HISTORY:  1. Partial amputation, right third toe.  2. Breast biopsy.   MEDICATIONS:  1. Lantus insulin 60 units daily. 2. Lisinopril 20 mg daily. 3. Augmentin 875 b.i.d.  4. Cipro 500 b.i.d.  5. NovoLog sliding scale. 6. Vicodin 5/325, 1 q.4 hours p.r.n. pain.   ALLERGIES: No known drug allergies.   FAMILY HISTORY: Heart disease, hypertension, diabetes, stroke, sickle cell disease, arthritis, asthma, psoriasis, gout.   SOCIAL HISTORY: She is widowed. Has been a caregiver for patients. Smokes 1 pack of cigarettes daily. Denies any current alcohol use.   REVIEW OF SYSTEMS: Significant numbness in the extremities. Some continued drainage from the amputation site on the right third toe. Mild redness in the forefoot. Denies any cough or shortness of breath. No fever or chills. No stomach pain or heartburn. No incontinence but does have some frequent urination.   PHYSICAL EXAMINATION:  VASCULAR: DP and PT pulses are palpable. Capillary filling time intact with the exception of the distal right third toe which has an eschar.   NEUROLOGICAL: Loss of protective threshold distally with monofilament.   INTEGUMENT: Skin is warm, dry, and supple.  There is an eschar at the distal tip of the amputation stump on the right third toe. Some drainage and necrotic tissue is noted both medial and laterally at the incision. Some mild cellulitis.   MUSCULOSKELETAL: Adequate range of motion. Muscle testing is 5/5.   LABORATORY, DIAGNOSTIC AND RADIOLOGICAL DATA: X-rays, three views taken today, reveal previous amputation of the distal portion of the right third toe. Does appear to be some early erosive changes in the head of the proximal phalanx consistent with osteomyelitis. Does not appear to be gas in the tissues.   IMPRESSION:  1. Poorly controlled diabetes with associated neuropathy.  2. Cellulitis with abscess, right third toe, evidence of osteomyelitis.   PLAN: Patient is admitted for IV antibiotics. Consult [facility] for medical history and physical and daily medical management. At this point we will plan on surgery for tomorrow morning. Patient will be n.p.o. after midnight. Consent form for amputation right third toe. We will follow the patient accordingly after surgery.   ____________________________ Sharlotte Alamo, DPM tc:cms D: 02/02/2012 13:18:41 ET T: 02/02/2012 13:45:23 ET  JOB#: [id number] cc: Sharlotte Alamo, DPM, <Dictator> Misheel Gowans DPM ELECTRONICALLY SIGNED 02/16/2012 10:29

## 2014-08-18 NOTE — Discharge Summary (Signed)
PATIENT NAME:  Melinda [name], SCHUTTER MR#:  [id number] DATE OF BIRTH:  03-Aug-1955  DATE OF ADMISSION:  01/19/2012 DATE OF DISCHARGE:  01/22/2012  DIAGNOSES:  1. Right third toe cellulitis/osteomyelitis, status post amputation. 2. Uncontrolled diabetes.  3. Leukocytosis.  4. Tobacco abuse. 5. Hypertension. 6. Diabetic neuropathy.   DISPOSITION: The patient is being discharged home. Follow up with Dr. Sharlotte Alamo in 1 to 2 weeks after discharge.   DIET: Low sodium, 1800 calorie ADA diet.   ACTIVITY: As tolerated.   DISCHARGE MEDICATIONS:  1. NovoLog insulin sliding scale. 2. Lantus 60 units subcutaneously once a day. 3. Tylenol/hydrocodone 325/5 one tablet q.4 hours p.r.n. pain. 4. Lisinopril 5 mg daily.  5. Augmentin 875 mg b.i.d.  6. Ciprofloxacin 500 mg b.i.d.   CONSULTATION: Podiatry consultation with Dr. Cleda Mccreedy.   PROCEDURES: Right third toe amputation.   RESULTS: X-ray right foot worrisome for osteomyelitis of the tuft of the distal phalanx of the right third toe. Left foot x-ray: Mild age-related and possibly post traumatic changes of the left foot, no evidence of  blastic lesions or acute fracture, no evidence of soft tissue abscess.   MICROBIOLOGY: Wound cultures growing coagulase-negative Staphylococcus enterococcus, Enterobacter aerobic gram-positive rod. White count on admission, 8.4. By the time of discharge normal hemoglobin, normal platelet count. Hemoglobin 13.5. Complete metabolic panel normal other than elevated ALP of 160.   HOSPITAL COURSE: The patient is a 59 year old female with past medical history of diabetes, hypertension who presented with right third toe swelling and redness. On x-ray she had some findings of osteomyelitis so podiatry consultation with Dr. Cleda Mccreedy was obtained. The patient underwent amputation of the right third toe 01/20/2012. She was treated with vancomycin and Zosyn while in the hospital. Wound cultures were sent which grew enterococcus Enterobacter  coagulase-negative staph and gram-positive rods. She has been switched to oral antibiotics, Augmentin and ciprofloxacin, by the time of discharge. She has uncontrolled diabetes. Her hemoglobin A1c is 13.5. As an outpatient she is not very compliant with her medications. She was advised about medication and dietary compliance. The dose of her Lantus had to be increased. She was extensively counseled about smoking cessation. She had some leukocytosis,  was related to her infection which has now resolved. Her hypertension has remained well controlled. She is being discharged home in a stable condition.   TIME SPENT: 45 minutes.    ____________________________ Cherre Huger, MD sp:vtd D: 01/22/2012 14:43:59 ET T: 01/24/2012 14:16:51 ET JOB#: [id number]  cc: Cherre Huger, MD, <Dictator> Cherre Huger MD ELECTRONICALLY SIGNED 01/24/2012 15:52

## 2014-08-18 NOTE — H&P (Signed)
PATIENT NAME:  Melinda [name], Melinda [name] MR#:  [id number] DATE OF BIRTH:  04-26-1956  DATE OF ADMISSION:  01/19/2012  PRIMARY CARE PHYSICIAN: She has no primary care physician. REFERRING PHYSICIAN: Dr. Marjean Donna    CHIEF COMPLAINT: Bilateral foot cellulitis, right more than left.   HISTORY OF PRESENT ILLNESS: Melinda [name] is a 59 year old African American female with history of diabetes mellitus type 2, on insulin. She was in her usual state of health until about two weeks ago when she noticed there is discoloration and swelling of the right foot third toe. She states that she was wearing sneakers without socks and after a while it became tender and swollen. The area became more painful and swollen therefore she came to the Emergency Department seeking medical attention. On the left foot she also developed swelling of the second toe with discoloration but this is not painful or tender compared to the right foot. She denies having any fever. No chills.   PAST MEDICAL HISTORY:  1. Diabetes mellitus type 2, on insulin.  2. Tobacco abuse.  3. Past history of cocaine and marijuana abuse.   SOCIAL HISTORY: She is widowed. Lives at home. She finished high school. 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   FAMILY HISTORY: Both parents had diabetes mellitus.   SOCIAL HABITS: Chronic smoker, 1 pack per day since age of 31. No alcohol, only socially. She abused cocaine in the past, the last time was two years ago. Also, there is past history of marijuana abuse but none recent.   PAST SURGICAL HISTORY: Breast biopsy.   ADMISSION MEDICATIONS:  1. Lantus 30 units in the morning.  2. NovoLog sliding scale.   ALLERGIES: No known drug allergies.   PHYSICAL EXAMINATION:  VITAL SIGNS: Blood pressure 185/85, follow-up blood pressure 154/65, respiratory rate 18, pulse 100, temperature 99, oxygen saturation 98%.   GENERAL APPEARANCE: Middle-aged female sitting on the stretcher in no acute distress.    HEAD AND NECK: No pallor. No icterus. No cyanosis.   EARS, NOSE AND THROAT: Hearing was normal. Nasal mucosa, lips, tongue were normal.   EYES: Normal eyelids and conjunctiva. Pupils about 4 mm to 5 mm, equal and sluggishly reactive to light.   NECK: Supple. Trachea at midline. No thyromegaly. No cervical lymphadenopathy. No masses.   HEART: Normal S1, S2. No S3, S4. No murmur. No gallop. No carotid bruits.   RESPIRATORY: Normal breathing pattern without use of accessory muscles. No rales. No wheezing.   ABDOMEN: Soft without tenderness. No hepatosplenomegaly. No masses. No hernias.   SKIN: Erythema and skin discoloration, gray-like color, over the right foot third toe with soft tissue swelling as well. Similar changes on the left foot second toe but to less extent. Both feet are warm to touch. Dorsalis pedis was felt but thready in both feet.   MUSCULOSKELETAL: No joint swelling. No clubbing.   NEUROLOGIC: Cranial nerves II through XII are intact. No focal motor deficit.   PSYCHIATRIC: Patient is alert, oriented x3. Mood and affect were normal.   LABORATORY, DIAGNOSTIC, AND RADIOLOGICAL DATA: Serum glucose 339, BUN 10, creatinine 0.8, sodium 136, potassium 4.2, hemoglobin A1c 13.5, total protein 8.3, bilirubin 0.2, alkaline phosphatase elevated at 160, AST and ALT were normal. CBC showed white count 12,000, hemoglobin 14, hematocrit 45, platelet count 307.   ASSESSMENT:  1. Bilateral feet cellulitis, right more than left affecting the right third toe and left second toe, rule out underlying osteomyelitis.  2. Severe systemic hypertension  with no treatment.  3. Uncontrolled diabetes mellitus with hemoglobin A1c more than 13.  4. Tobacco abuse.   PLAN: Will admit the patient to the medical floor. X-ray of the right foot and left foot were reviewed. There is soft tissue swelling in the respected areas. I do not see any bone lesions but this needs to be reviewed with the radiologist.  IV antibiotic started using Zosyn and vancomycin. Surgical consultation was requested. I will continue her Lantus dose at 30 units in place her on insulin sliding scale with Accu-Cheks. For her hypertension I will start lisinopril at 10 mg a day and may increase the dose if needed. Patient needs to quit smoking and I will place her on nicotine patch. For deep vein thrombosis prophylaxis will place the patient on Lovenox 30 units a day.   TIME SPENT EVALUATING THIS PATIENT: Took more than 50 minutes including reviewing her medical records. I also answered her questions and her nephew's questions who is present in her room.   ____________________________ Clovis Pu. Lenore Manner, MD amd:cms D: 01/19/2012 04:29:19 ET T: 01/19/2012 08:35:40 ET JOB#: [id number]  cc: Clovis Pu. Lenore Manner, MD, <Dictator>  Mike Craze Irven Coe MD ELECTRONICALLY SIGNED 01/19/2012 22:18

## 2014-08-18 NOTE — Op Note (Signed)
PATIENT NAME:  Melinda [name], Melinda [name] MR#:  [id number] DATE OF BIRTH:  05-30-1955  DATE OF PROCEDURE:  02/03/2012  PREOPERATIVE DIAGNOSIS: Abscess amputation stump, right third toe.   POSTOPERATIVE DIAGNOSIS: Abscess amputation stump, right third toe.   PROCEDURE: Amputation metatarsophalangeal joint right third toe.   SURGEON: Sharlotte Alamo, DPM  ANESTHESIA: Local MAC.   HEMOSTASIS: Pneumatic tourniquet, right ankle, 250 mmHg.   ESTIMATED BLOOD LOSS: Minimal.   PATHOLOGY: Partial right third toe.   DRAINS: 4 x 4 sterile saline-soaked gauze packed within the wound.   CULTURES: Bone cultures proximal phalanx right third toe.   COMPLICATIONS: None apparent.   OPERATIVE INDICATIONS: This is a 59 year old female with poorly controlled diabetes with recent amputation of the distal aspect of her right third toe. The toe continued to present with infection and abscess outpatient and the decision was made for re-hospitalization and complete amputation of the third toe.   OPERATIVE PROCEDURE: The patient was taken to the operating room and placed on the table in the supine position. Following satisfactory sedation, the right forefoot was anesthetized with 10 mL of 0.5% Sensorcaine plain. Pneumatic tourniquet was applied at the level of the right ankle and the foot was prepped and draped in the usual sterile fashion. The foot was exsanguinated and the tourniquet inflated to 250 mmHg.   Attention was then directed to the distal aspect of the right foot where an elliptical incision was made coursing dorsal to plantar around the base of the third toe. The incision was carried sharply down to the level of bone and dissected periosteally back to the level of the metatarsophalangeal joint where the toe was disarticulated and removed in toto. The wound edges were then cleared using a rongeur and the wound was thoroughly irrigated with 3 liters of pulsed irrigation. The wound was then closed using 4-0 nylon vertical  mattress and simple interrupted sutures. The interdigital area was left open and a 4 x 4 saline gauze was packed within the wound followed by 4 x 4 dressing, fluffs, ABD, Kerlix, and an Ace wrap. The tourniquet was released and blood flow noted to return to the right foot. The patient tolerated the procedure and anesthesia well and was transported to post anesthesia care unit with vital signs stable and in good condition.    ____________________________ Sharlotte Alamo, DPM tc:bjt D: 02/03/2012 09:27:40 ET T: 02/03/2012 09:40:29 ET JOB#: [id number]  cc: Sharlotte Alamo, DPM, <Dictator> Genifer Lazenby DPM ELECTRONICALLY SIGNED 02/16/2012 10:29

## 2014-08-18 NOTE — Discharge Summary (Signed)
PATIENT NAME:  Melinda [name], Melinda [name] MR#:  [id number] DATE OF BIRTH:  Sep 19, 1955  DATE OF ADMISSION:  02/02/2012 DATE OF DISCHARGE:  02/05/2012  PRINCIPAL DIAGNOSIS: Abscess with cellulitis, right third toe, status post partial amputation.   SECONDARY DIAGNOSES:  1. Diabetes, insulin-requiring. 2. Diabetic related neuropathy. 3. Hypertension.   PROCEDURES: Amputation right third toe metatarsal phalangeal joint.   CONSULTS: Prime [facility] for medical History and Physical and daily medical management.   HOSPITAL COURSE: The patient was admitted on 02/02/2012 and started on Zosyn antibiotics. She was stabilized for surgery, which was performed on 02/03/2012. Postoperatively, the patient remained afebrile. She did have some significant pain managed with Percocet and morphine. Postoperatively, the incision was well coapted with the central area left open, granular with some mild bloody drainage. Infection appeared to be under control. Blood sugars came down throughout the hospital stay and the last 24 hours were in the mid 100s range. Overall, the patient is stable for discharge on 02/05/2012.   DISCHARGE MEDICATIONS:   1. Augmentin 875 mg, 1 p.o. every 12 hours. 2. Ciprofloxacin 500 mg 1 p.o. every 12 hours. 3. Percocet 10/325, 1 p.o. every 6 hours p.r.n. pain.   DISCHARGE INSTRUCTIONS AND FOLLOWUP:   1. The patient will be full weight-bearing in a surgical shoe.  2. A prescription for a four-point walker to assist with ambulation.  3. Follow-up appointment for this Friday in my office.  4. [place] for daily wound packing to the right foot.     The patient is discharged in stable and satisfactory condition.  ____________________________ Sharlotte Alamo, DPM tc:cbb D: 02/05/2012 08:03:24 ET T: 02/05/2012 09:34:34 ET JOB#: [id number]  cc: Sharlotte Alamo, DPM, <Dictator> Cayman Kielbasa DPM ELECTRONICALLY SIGNED 02/16/2012 10:29

## 2014-08-27 ENCOUNTER — Encounter: Payer: Self-pay | Admitting: Cardiothoracic Surgery

## 2014-08-27 NOTE — Progress Notes (Signed)
This encounter was created in error - please disregard.

## 2014-08-27 NOTE — Progress Notes (Deleted)
[address]       [place] [address]             [phone]      Melinda [name] Medical Record #[id number] Date of Birth: February 23, 1956  Referring: Leonie Man, MD Primary Care: Georgena Spurling, MD  Chief Complaint:   POST OP FOLLOW UP 06/08/2014 DATE OF DISCHARGE:  OPERATIVE REPORT PREOPERATIVE DIAGNOSES: Recent acute myocardial infarction. POSTOPERATIVE DIAGNOSIS: Recent acute myocardial infarction. SURGICAL PROCEDURES: Coronary artery bypass grafting x4 with left internal mammary to the left anterior descending coronary artery, reverse saphenous vein graft to the diagonal coronary artery, reverse saphenous vein graft to the obtuse marginal coronary artery, reverse saphenous vein graft to the posterior descending coronary artery with right and left thigh, calf endovein harvesting. SURGEON: Lanelle Bal, MD  History of Present Illness:          Past Medical History  Diagnosis Date  . HTN (hypertension)   . Tobacco abuse   . Anxiety   . Peripheral neuropathy   . Obesity   . Coronary artery disease     a. NSTEMI 06/2014; b. 4 vessel CABG 06/08/2014 LIMA to LAD, SVG to Diagonal, SVG to OM, & SVG to PDA  . Ischemic cardiomyopathy     a. echo 06/04/14 EF 40-45%, HK of entire inferolateral and inferior myocardium c.w infarct of RCA/LCx, GR2DD, mild MR  . IDDM (insulin dependent diabetes mellitus)   . GERD (gastroesophageal reflux disease)   . OA (osteoarthritis) of knee      History  Smoking status  . Current Every Day Smoker -- 1.00 packs/day for 40 years  . Types: Cigarettes  Smokeless tobacco  . Never Used    History  Alcohol Use No     No Known Allergies  Current Outpatient Prescriptions  Medication Sig Dispense Refill  . acetaminophen (TYLENOL) 500 MG tablet Take 1,000 mg by mouth every 8 (eight) hours as needed.     Marland Kitchen aspirin EC 81 MG tablet Take 1 tablet (81 mg total) by mouth daily. 90 tablet 3  .  atorvastatin (LIPITOR) 80 MG tablet Take 1 tablet (80 mg total) by mouth daily at 6 PM. 30 tablet 1  . clopidogrel (PLAVIX) 75 MG tablet Take 1 tablet (75 mg total) by mouth daily. 90 tablet 2  . furosemide (LASIX) 40 MG tablet Take 40 mg by mouth daily.    Marland Kitchen gabapentin (NEURONTIN) 600 MG tablet Take 800 mg by mouth 3 (three) times daily.     . Insulin Glargine (LANTUS) 100 UNIT/ML Solostar Pen Inject 35 Units into the skin 2 (two) times daily.    Marland Kitchen lisinopril (PRINIVIL,ZESTRIL) 2.5 MG tablet Take 1 tablet (2.5 mg total) by mouth daily. (Patient taking differently: Take 2.5 mg by mouth daily. 1/2 TABLET DAILY) 30 tablet 6  . metFORMIN (GLUCOPHAGE) 500 MG tablet Take 1,000 mg by mouth 2 (two) times daily.    . metoprolol tartrate (LOPRESSOR) 25 MG tablet Take 1 tablet (25 mg total) by mouth 2 (two) times daily. 60 tablet 6  . oxyCODONE (OXY IR/ROXICODONE) 5 MG immediate release tablet Take 1-2 tablets (5-10 mg total) by mouth every 6 (six) hours as needed for severe pain. 20 tablet 0   No current facility-administered medications for this visit.       Physical Exam: There were no vitals taken for this visit.  {Physical BWLS:9373428}   Diagnostic Studies & Laboratory data:  Recent Radiology Findings:   No results found.    Recent Lab Findings: Lab Results  Component Value Date   WBC 11.3* 06/11/2014   HGB 8.2* 06/11/2014   HCT 25.3* 06/11/2014   PLT 278 06/11/2014   GLUCOSE 150* 06/11/2014   ALT 25 06/05/2014   AST 29 06/05/2014   NA 137 06/11/2014   K 4.8 06/11/2014   CL 106 06/11/2014   CREATININE 0.92 06/11/2014   BUN 14 06/11/2014   CO2 21 06/11/2014   TSH 1.002 06/04/2014   INR 1.28 06/08/2014   HGBA1C 10.5* 06/07/2014      Assessment / Plan:            Grace Isaac MD      [address] [place] [address] Office [phone]   Beeper [phone]  08/27/2014 12:46 PM

## 2014-08-30 NOTE — H&P (Signed)
PATIENT NAME:  Melinda [name], Melinda [name] MR#:  [id number] DATE OF BIRTH:  20-Aug-1955  DATE OF ADMISSION:  06/01/2014  PRIMARY CARE PHYSICIAN: At [facility], Dr.   CHIEF COMPLAINT: Pressure in the nose and face.  HISTORY OF PRESENT ILLNESS: This is a 59 year old female who over the last 3 days has not been feeling well, "sick as a dog." She has been nauseous. She has been sore on the bone on her nose and in the face. Today she had to leave work because she felt sick. She has been having chest pain, tightness coming and going sense in the chest, more while she is waking up for the past 3 days. She has been having abdominal pain since December since increasing the dose of her metformin. She has been nauseous. In the ER, she was found to have a low blood pressure and tachycardic. She states that she had a slight fever and she has been having some shaking chills and being fatigued. In the ER, she had a CAT scan of the abdomen and pelvis and maxillary facial area which showed a possible sinusitis, but otherwise negative. She had a white count of 14.7. She presented to the ER with a low blood pressure. Hospitalist services were contacted for further evaluation.   PAST MEDICAL HISTORY: Diabetes, hypertension, anxiety, and neuropathy.   PAST SURGICAL HISTORY: Breast biopsy, toe amputation.  ALLERGIES: No known drug allergies.  SOCIAL HISTORY: Smokes 1 pack per day, many years. No alcohol. No drug use. 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   FAMILY HISTORY: Father died after his heart stopped beating. Also had diabetes. Mother with diabetes, MI, and congestive heart failure. She passed away from that.  MEDICATIONS: Include Celexa 40 mg daily, gabapentin 600 mg twice a day, hydrochlorothiazide 25 mg daily, Lantus 35 units subcutaneous injection twice a day, metformin 1000 mg twice a day.  REVIEW OF SYSTEMS:  CONSTITUTIONAL: Positive for fever. Positive for shaking chills. Positive for fatigue. No weight gain. No  weight loss.  EYES: She does have problems with her vision and needs glasses but does have reading glasses. EARS, NOSE, MOUTH, AND THROAT: Decreased hearing. Positive for runny nose. Positive for dysphagia to liquids and solids.  CARDIOVASCULAR: Positive for chest pain when waking up in the morning.  RESPIRATORY: No shortness of breath. No cough. No sputum. No hemoptysis. GASTROINTESTINAL: Positive for nausea. Positive for abdominal pain. Positive for diarrhea every time she goes to the bathroom.  GENITOURINARY: Positive for burning on urination.  MUSCULOSKELETAL: Positive for knee pain and facial pain.  INTEGUMENT: No rashes or eruptions. Positive for itching.  NEUROLOGICAL: No fainting or blackouts.  PSYCHIATRIC: No anxiety or depression.  ENDOCRINE: No thyroid problems.  HEMATOLOGIC AND LYMPHATIC: No anemia.  PHYSICAL EXAMINATION: VITAL SIGNS: On presentation included a temperature of 98.6, pulse 120, respirations 20, blood pressure 96/50, pulse oximetry 97% on room air.  GENERAL: No respiratory distress.  EYES: Conjunctivae and lids normal. Pupils equal, round, and reactive to light. Extraocular muscles intact. No nystagmus. EARS, NOSE, MOUTH, AND THROAT: Tympanic membrane: No erythema. Nasal mucosa: No erythema. Throat: No erythema. No exudate seen. Lips and gums: No lesions.  NECK: No JVD. No bruits. No lymphadenopathy. No thyromegaly. No thyroid nodules palpated.  RESPIRATORY: Lungs clear to auscultation. No use of accessory muscles to breathe. No rhonchi, rales, or wheeze heard.  CARDIOVASCULAR: S1, S2 normal. No gallops, rubs, or murmurs heard. Carotid upstroke 2+ bilaterally. No bruits. Dorsalis pedis pulses 2+ bilaterally noted  in the lower extremities.  ABDOMEN: Soft, nontender, no organomegaly or splenomegaly. Normoactive bowel sounds. No masses felt. LYMPHATIC: No lymph nodes in the neck.  MUSCULOSKELETAL: No clubbing, edema, or cyanosis.  SKIN: Positive pain to palpation over  the facial bones and nose.  PSYCHIATRIC: The patient is oriented to person, place, and time.  NEUROLOGICAL: Cranial nerves II through XII grossly intact. Deep tendon reflexes 2+ bilateral lower extremities.  LABORATORY AND RADIOLOGICAL DATA: CT scan of the maxillofacial area and head shows no evidence of intracranial pathology, partial opacification of the right-sided sphenoid sinus.   Abdomen and pelvis CT scan with contrast negative. Chest x-ray negative.  Lactic acid 2.7. White blood cell count 14.7, H and H 12.8 and 41.1, platelet count of 317,000.  Glucose 205, BUN 17, creatinine 1.42, sodium 137, potassium 3.7, chloride 102, CO2 of 27, calcium 9.1. Liver function tests normal range, lipase 70.  ASSESSMENT AND PLAN: 1.  Systemic inflammatory response syndrome with hypotension, tachycardia, and leukocytosis. I do not have a source of fever at this point in time; could be a sinusitis. Still waiting for the patient to urinate. Will give vigorous intravenous fluid hydration, hold patient's blood pressure medications at this point, and continue to monitor closely. I will give Augmentin for possible sinusitis. This would also cover anything in the urine if the urine was positive. Continue to monitor closely.  2.  Chest pain. Currently not having chest pain. Will get an EKG, serial cardiac enzymes, monitor on telemetry. Could be acid reflux since this does happen in the morning the past 3 mornings. I will give Zantac.  3.  Abdominal pain ever since she increased her dose of metformin to 1000 mg twice a day. Recommend decreasing the dose down to 500 twice a day but that can be done as an outpatient. Since she had a CAT scan with contrast here, need to hold the metformin while here.  4.  Diabetes with neuropathy. Continue Lantus and gabapentin and also will give sliding scale. 5.  Tobacco abuse. We will give a Nicotrol inhaler. Smoking cessation counseling done, 3 minutes by me. Nicotine patch  applied. 6.  Anxiety. On Celexa. Will continue that.  TIME SPENT ON ADMISSION: 55 minutes.    ____________________________ Tana Conch. Leslye Peer, MD rjw:ST D: 06/01/2014 22:17:00 ET T: 06/01/2014 23:07:05 ET JOB#: [id number]  cc: Tana Conch. Leslye Peer, MD, <Dictator> Unknown cc Marisue Brooklyn MD ELECTRONICALLY SIGNED 06/11/2014 13:48

## 2014-08-30 NOTE — Consult Note (Signed)
General Aspect Primary Cardiologist: New to [facility] ________________  59 year old female with history of DM2, HTN, ongoing tobacco abuse, anxiety, and neuropathy who presented to [facility] with symptoms of "not feeling well." Symptoms included left nasal bridge pain and diffuse abdominal pain. She was found to have sinusitis and an elevated troponin.  _______________  PMH: 1. DM2 2. HTN 3. Ongoing tobacco abuse 4. Anxiety 5. Neuroapthy  ________________   Present Illness 59 year old female with the above problem list who presented to [facility] ED on 2/1 with 1 day history of abdominal pain. Patient denies prior cardiac history. No prior stress tests or cardiac caths. She has not felt well since December 2015. At that time her metformin was increased from 500 mg bid to 1000 mg bid. Since then she has dealt with increased abdominal cramping, diarrhea, and fatigue. Her abdominal discomfort is typically epigastric, but can also be lower abdomen. She also notes some significant reflux. She has continued on with her daily routine as best she can. She was sitting on her portch waiting for her ride to go to work on 2/1 when the left side of her nasal bridge began to hurt significantly along with her abdomen. She also briefly experienced some chest discomfort. She called for EMS evaluation. Upon their arrival it was felt that she had a sinus infection and that she should come to [facility] for further evaluation. Her blood pressure while EMS was on scene was 90s/60s.   Upon her arrival to [facility] ED BP was found to be 85/51. She denied any further chest pain. She continued to complain of left nasal bridge pain and abdominal pain. Head CT showed sinus infection. CT abdomen showed stable diverticulosis. No findings to explain her symptoms. She was started on Augmentin. Her troponin <0.02-->1.30-->3.60, SCr 1.42-->1.53, WBC count 14.7-->19.7. She was started on heparin gtt and IV fluids. BP has improved to 120s/80s. She currently  feels much better this morning.  Last two troponins of greater than 7, now greater than 13   Physical Exam:  GEN well developed, no acute distress, obese   HEENT hearing intact to voice   NECK supple   RESP normal resp effort  clear BS   CARD Regular rate and rhythm  No murmur   ABD positive tenderness  diffuse TTP   LYMPH negative neck   EXTR negative edema   SKIN normal to palpation   NEURO motor/sensory function intact   PSYCH alert, A+O to time, place, person, good insight   Review of Systems:  Subjective/Chief Complaint abdominal pain, general malaise, improving   General: Fatigue   Skin: No Complaints   ENT: No Complaints   Eyes: No Complaints   Neck: No Complaints   Respiratory: No Complaints   Cardiovascular: Chest pain or discomfort   Gastrointestinal: Diarrhea  diffuse abdominal pain   Genitourinary: No Complaints   Vascular: No Complaints   Musculoskeletal: No Complaints   Neurologic: No Complaints   Hematologic: No Complaints   Endocrine: No Complaints   Psychiatric: No Complaints   Review of Systems: All other systems were reviewed and found to be negative   Medications/Allergies Reviewed Medications/Allergies reviewed   Family & Social History:  Family and Social History:  Family History Coronary Artery Disease  mother: CAD s/p MI in her 40s, HTN; father: SCD   Social History positive  tobacco, positive tobacco (Greater than 1 year), negative ETOH, negative Illicit drugs   + Tobacco Current (within 1 year)  36  pack years   Place of Living Home     htn:    Diabetes:    Lumpectomy:        Admit Diagnosis:   SIRS: Onset Date: 02-Jun-2014, Status: Active, Description: SIRS  Home Medications: Medication Instructions Status  citalopram 40 mg oral tablet 1 tab(s) orally once a day Active  gabapentin 600 mg oral tablet 1 tab(s) orally 2 times a day Active  Lantus 100 units/mL subcutaneous solution 35 unit(s)  subcutaneous 2 times a day Active  metFORMIN 1000 mg oral tablet 1 tab(s) orally 2 times a day Active  hydrochlorothiazide 25 mg oral tablet 1 tab(s) orally once a day Active   Lab Results:  Routine Micro:  01-Feb-16 21:40   Micro Text Report BLOOD CULTURE   COMMENT                   NO GROWTH IN 8-12 HOURS   ANTIBIOTIC                       Micro Text Report BLOOD CULTURE   COMMENT                   NO GROWTH IN 8-12 HOURS   ANTIBIOTIC                       Culture Comment NO GROWTH IN 8-12 HOURS  Result(s) reported on 02 Jun 2014 at 05:00AM.  Culture Comment NO GROWTH IN 8-12 HOURS  Result(s) reported on 02 Jun 2014 at 05:00AM.    22:58   Micro Text Report URINE CULTURE   COMMENT                   NO GROWTH IN 8-12 HOURS   ANTIBIOTIC                       Culture Comment NO GROWTH IN 8-12 HOURS  Result(s) reported on 02 Jun 2014 at 08:57AM.  Routine Chem:  01-Feb-16 17:25   Creatinine (comp)  1.42  02-Feb-16 04:05   Result Comment TROPONIN - RESULTS VERIFIED BY REPEAT TESTING.  - PREV. C/ 06-02-14 @0057  BY AJO...AJO  Result(s) reported on 02 Jun 2014 at 05:12AM.  Glucose, Serum  212  BUN  19  Creatinine (comp)  1.53  Sodium, Serum 139  Potassium, Serum 3.9  Chloride, Serum 107  CO2, Serum 24  Calcium (Total), Serum  8.2  Anion Gap 8  Osmolality (calc) 286  eGFR (African American)  45  eGFR (Non-African American)  37 (eGFR values <12m/min/1.73 m2 may be an indication of chronic kidney disease (CKD). Calculated eGFR, using the MRDR Study equation, is useful in  patients with stable renal function. The eGFR calculation will not be reliable in acutely ill patients when serum creatinine is changing rapidly. It is not useful in patients on dialysis. The eGFR calculation may not be applicable to patients at the low and high extremes of body sizes, pregnant women, and vegetarians.)  Cholesterol, Serum 94  Triglycerides, Serum 122  HDL (INHOUSE)  39  VLDL  Cholesterol Calculated 24  LDL Cholesterol Calculated 31 (Result(s) reported on 02 Jun 2014 at 04:34AM.)  Cardiac:  01-Feb-16 17:25   Troponin I < 0.02 (0.00-0.05 0.05 ng/mL or less: NEGATIVE  Repeat testing in 3-6 hrs  if clinically indicated. >0.05 ng/mL: POTENTIAL  MYOCARDIAL INJURY. Repeat  testing in 3-6 hrs if  clinically indicated.  NOTE: An increase or decrease  of 30% or more on serial  testing suggests a  clinically important change)  02-Feb-16 00:03   Troponin I  1.30 (0.00-0.05 0.05 ng/mL or less: NEGATIVE  Repeat testing in 3-6 hrs  if clinically indicated. >0.05 ng/mL: POTENTIAL  MYOCARDIAL INJURY. Repeat  testing in 3-6 hrs if  clinically indicated. NOTE: An increase or decrease  of 30% or more on serial  testing suggests a  clinically important change)    04:05   Troponin I  3.60 (0.00-0.05 0.05 ng/mL or less: NEGATIVE  Repeat testing in 3-6 hrs  if clinically indicated. >0.05 ng/mL: POTENTIAL  MYOCARDIAL INJURY. Repeat  testing in 3-6 hrs if  clinically indicated. NOTE: An increase or decrease  of 30% or more on serial  testing suggests a  clinically important change)  CPK-MB, Serum  20.5 (Result(s) reported on 02 Jun 2014 at 04:40AM.)    10:49   Troponin I  7.80 (0.00-0.05 0.05 ng/mL or less: NEGATIVE  Repeat testing in 3-6 hrs  if clinically indicated. >0.05 ng/mL: POTENTIAL  MYOCARDIAL INJURY. Repeat  testing in 3-6 hrs if  clinically indicated. NOTE: An increase or decrease  of 30% or more on serial  testing suggests a  clinically important change)    15:45   Troponin I  13.26 (0.00-0.05 0.05 ng/mL or less: NEGATIVE  Repeat testing in 3-6 hrs  if clinically indicated. >0.05 ng/mL: POTENTIAL  MYOCARDIAL INJURY. Repeat  testing in 3-6 hrs if  clinically indicated. NOTE: An increase or decrease  of 30% or more on serial  testing suggests a  clinically important change)  Routine Hem:  01-Feb-16 17:25   WBC (CBC)  14.7  Hemoglobin  (CBC) 12.8  02-Feb-16 04:05   WBC (CBC)  19.8  RBC (CBC) 4.56  Hemoglobin (CBC)  11.2  Hematocrit (CBC) 35.4  Platelet Count (CBC) 267  MCV  78  MCH  24.5  MCHC  31.6  RDW  15.3  Neutrophil % 91.4  Lymphocyte % 4.4  Monocyte % 3.8  Eosinophil % 0.2  Basophil % 0.2  Neutrophil #  18.1  Lymphocyte #  0.9  Monocyte # 0.8  Eosinophil # 0.0  Basophil # 0.0 (Result(s) reported on 02 Jun 2014 at 04:34AM.)   EKG:  EKG Interp. by me   Interpretation EKG shows NSR, 79, 2nd degree AVB Mobitz type I, left axis deviation, nonspecific inferior st/t changes   Radiology Results: XRay:    01-Feb-16 18:05, Chest Portable Single View  Chest Portable Single View   REASON FOR EXAM:    SIRS  COMMENTS:       PROCEDURE: DXR - DXR PORTABLE CHEST SINGLE VIEW  - Jun 01 2014  6:05PM     CLINICAL DATA:  Fever with body aches for several days. History of  hypertension and diabetes. Initial encounter.    EXAM:  PORTABLE CHEST - 1 VIEW    COMPARISON:  02/02/2012 and 11/30/2010.    FINDINGS:  1749 hr. Low lung volumes with mild patient rotation to the right.  There is mild atelectasis at both lung bases, but no confluent  airspace opacity, edema or significant pleural effusion. The heart  size and mediastinal contours are stable. The bones appear  unchanged.     IMPRESSION:  Low lung volumes with mild bibasilar atelectasis. No evidence of  pneumonia.      Electronically Signed    By: Camie Patience M.D.    On: 06/01/2014 18:50  Verified By: Vivia Ewing, M.D.,  Cardiology:    02-Feb-16 08:52, Echo Doppler  Echo Doppler   REASON FOR EXAM:      COMMENTS:       PROCEDURE: [facility] - ECHO DOPPLER COMPLETE(TRANSTHOR)  - Jun 02 2014  8:52AM     RESULT: Echocardiogram Report    Patient Name:   Melinda [name] Date of Exam: 06/02/2014  Medical Rec #:  [id number]         Custom1:  Date of Birth:  13-Aug-1955      Height:       68.0 in  Patient Age:    58 years       Weight:       245.0  lb  Patient Gender: F              BSA:          2.23 m??    Indications: MI  Sonographer:    Sherrie Sport RDCS  Referring Phys: Loletha Grayer, J    Summary:   1. Left ventricular ejection fraction, by visual estimation, is 50 to   55%.   2. Low normal global left ventricular systolic function.   3. Select images suggestive of mild to moderate hypokinesis of the   inferior, posterior and inferolateral walls.   4. Normal right ventricular size and systolic function.   5. Mild mitral valve regurgitation.   6. Mild aortic valve sclerosis without stenosis.   7. Mild tricuspid regurgitation.   8. Normal RVSP  2D AND M-MODE MEASUREMENTS (normal ranges within parentheses):  Left Ventricle:         Normal  IVSd (2D):      1.55 cm (0.7-1.1)  LVPWd (2D):     1.20 cm (0.7-1.1) Aorta/LA:                 Normal  LVIDd (2D):     4.56 cm (3.4-5.7) Aortic Root (2D): 2.30 cm (2.4-3.7)  LVIDs (2D):     3.52 cm          Left Atrium (2D): 2.90 cm (1.9-4.0)  LV FS (2D):     22.8 %  (>25%)  LV EF (2D):     45.9 %  (>50%)                                    Right Ventricle:                                    RVd (2D):        6.04 cm  LV DIASTOLIC FUNCTION:  MV Peak E: 0.99 m/s E/e' Ratio: 19.20  MV Peak A: 0.89 m/s Decel Time: 173 msec  E/A Ratio: 1.12  SPECTRAL DOPPLER ANALYSIS (where applicable):  Mitral Valve:  MV P1/2 Time: 50.17 msec  MV Area, PHT: 4.39 cm??  Aortic Valve: AoV Max Vel: 1.02 m/s AoV PeakPG: 4.1 mmHg AoV Mean PG:  LVOT Vmax: 0.80 m/s LVOT VTI:  LVOT Diameter: 2.10 cm  AoV Area, Vmax: 2.73 cm?? AoV Area, VTI:  AoV Area, Vmn:  Tricuspid Valve and PA/RV Systolic Pressure: TR Max Velocity: 2.84 m/s RA   Pressure: 5 mmHg RVSP/PASP: 37.3 mmHg  Pulmonic Valve:  PV Max Velocity: 0.69 m/s PV Max PG: 1.9 mmHg PV Mean PG:  PHYSICIAN INTERPRETATION:  Left Ventricle: The left ventricular internal cavity size was normal. LV   posterior wall thickness was normal. No left ventricular  hypertrophy.   Global LV systolic function was low normal. Left ventricular ejection   fraction, by visual estimation, is 50 to 55%. Spectral Doppler shows   normal pattern of LV diastolic filling.  Right Ventricle: Normal right ventricular size, wall thickness, and   systolic function. The right ventricular size is normal. Global RV   systolic function is normal.  Left Atrium: The left atrium is normal in size.  Right Atrium: The right atrium is normal in size.  Pericardium: There is no evidence of pericardial effusion.  Mitral Valve: The mitral valve is normal in structure. Mild mitral valve   regurgitation is seen.  Tricuspid Valve: The tricuspid valve is normal. Mild tricuspid   regurgitation is visualized. The tricuspid regurgitant velocity is 2.84  m/s, and with an assumed right atrial pressure of 5 mmHg, the estimated   right ventricular systolic pressure is normal at 37.3 mmHg.  Aortic Valve: The aortic valve is normal. Mild aortic valve sclerosis is   present, with no evidence of aortic valve stenosis. No evidence of aortic   valve regurgitation is seen.  Pulmonic Valve: The pulmonic valve is normal. Trace pulmonic valve   regurgitation.  Aorta: The aortic root and ascending aorta are structurally normal, with   no evidence of dilitation.    [id number] Ida Rogue MD  Electronically signed by [id number] Ida Rogue MD  Signature Date/Time: 06/02/2014/4:48:45 PM    *** Final ***    IMPRESSION: .        Verified By: Minna Merritts, M.D., MD  CT:    01-Feb-16 20:35, CT Abdomen and Pelvis With Contrast  CT Abdomen and Pelvis With Contrast   REASON FOR EXAM:    (1) hypotension, tachycardia.diffuse abd pain,   tender.; (2) hypotension, tachyca  COMMENTS:       PROCEDURE: CT  - CT ABDOMEN / PELVIS  W  - Jun 01 2014  8:35PM     CLINICAL DATA:  Generalized abdominal pain with fever, chills and  body aches for 1 week. Diarrhea for 1 month. No history of  malignancy or prior  relevant surgery. Initial encounter.    EXAM:  CT ABDOMEN AND PELVIS WITH CONTRAST    TECHNIQUE:  Multidetector CT imaging of the abdomen and pelvis was performed  using the standard protocol following bolus administration of  intravenous contrast.    CONTRAST:  100 ml Omnipaque 300.    COMPARISON:  Abdominal pelvic CT 08/06/2009.    FINDINGS:  Lower chest: Mild basilar atelectasis and emphysematous changes. No  significant pleural or pericardial effusion. A small hiatal hernia.    Hepatobiliary: The liver is normal in density without focal  abnormality. No evidence of gallstones, gallbladder wall thickening  or biliary dilatation.  Pancreas: Unremarkable. No pancreatic ductal dilatation or  surrounding inflammatory changes.    Spleen: Normal in size without focal abnormality.    Adrenals/Urinary Tract: Both adrenal glands appear normal.There is  possible mild cortical scarring in the lower pole of the left  kidney. The right kidney appears normal. There is no hydronephrosis  or evidence of urinary tract calculus. The bladder appears  unremarkable.    Stomach/Bowel: No evidence of bowel wall thickening, distention or  surrounding inflammatory change.There are mild diverticular changes  of the colon, greatest distally. The appendix appears normal.  Vascular/Lymphatic: Small lymph nodes within  the porta hepatis are  stable. There is stable aortoiliac atherosclerosis.    Reproductive: The uterus and ovaries appear unchanged. There is a  small calcified uterine fibroid. No adnexal mass.    Other: No evidence of abdominal wall mass or hernia.    Musculoskeletal: No acute or significant osseous findings. There is  stable ankylosis across the L4-5 and L5-S1 disc spaces. Degenerative  changes are present at the hips and sacroiliac joints.     IMPRESSION:  1. No acute findings or explanation for the patient's symptoms  demonstrated.  2. Stable colonic diverticulosis without  evidence of acute  inflammation. The appendix appears normal.  3. Atherosclerosis and spondylosis noted.      Electronically Signed    By: Camie Patience M.D.    On: 06/01/2014 20:55         Verified By: Vivia Ewing, M.D.,    01-Feb-16 20:35, CT Maxillofacial Area Without Contrast  CT Maxillofacial Area Without Contrast   REASON FOR EXAM:    L maxillary pain, hypotension, tachycardia. R   submand. lymphadenopathy, headache  COMMENTS:       PROCEDURE: CT  - CT MAXILLOFACIAL AREA WO  - Jun 01 2014  8:35PM     CLINICAL DATA:  Acute onset of left maxillary pain. Hypotension,  tachycardia, right submandibular lymphadenopathy and headache.  Initial encounter.    EXAM:  CT HEAD WITHOUT CONTRAST    CT MAXILLOFACIAL WITHOUT CONTRAST  TECHNIQUE:  Multidetector CT imaging of the head and maxillofacial structures  were performed using the standard protocol without intravenous  contrast. Multiplanar CT image reconstructions of the maxillofacial  structures were also generated.    COMPARISON:  CT of the head performed 08/18/2012, and maxillofacial  CT performed 11/30/2010    FINDINGS:  CT HEAD FINDINGS    There is no evidence of acute infarction, mass lesion, or intra- or  extra-axial hemorrhage on CT.  Mild periventricular white matter change likely reflects small  vessel ischemic microangiopathy.    The posterior fossa, including the cerebellum, brainstem and fourth  ventricle, is within normal limits. The third and lateral  ventricles, and basal ganglia are unremarkable in appearance. The  cerebral hemispheres are symmetric in appearance, with normal  gray-white differentiation. No mass effect or midline shift is seen.    There is no evidence of fracture; visualized osseous structures are  unremarkable in appearance. The orbits are within normal limits. The  paranasal sinuses and mastoid air cells are well-aerated. No  significant soft tissue abnormalities are  seen.    CT MAXILLOFACIAL FINDINGS  There is no evidence of fracture or dislocation. The maxilla and  mandible appear intact. The nasal bone is unremarkable in  appearance. There is absence of much of the dentition. Remaining  visualized mandibular teeth are grossly unremarkable.    The orbits are intact bilaterally. There is partial opacification of  the right side of the sphenoid sinus. The remaining visualized  paranasal sinuses and mastoid air cells are well-aerated.    No significant soft tissue abnormalities are seen. The  parapharyngeal fat planes are preserved. The nasopharynx, oropharynx  and hypopharynx are unremarkable in appearance. The visualized  portions of the valleculae and piriform sinuses are grossly  unremarkable. Mild calcification is noted at the carotid  bifurcations bilaterally.  The parotid and submandibular glands are within normal limits. No  cervical lymphadenopathy is seen. No asymmetry is identified with  regard to the visualized submandibular glands and cervical nodes.  There is perhaps slight asymmetric prominence of the fat overlying  the right masseter musculature and angle of the mandible.     IMPRESSION:  1. No acute intracranial pathology seen on CT.  2. No evidence of fracture or dislocation with regard to the  maxillofacial structures.  3. Mild small vessel ischemic microangiopathy.  4. Partial opacification of the right-sided sphenoid sinus.  5. The visualized submandibular glands and cervical nodes are  grossly unremarkable.  6. Mild calcification at the carotid bifurcations bilaterally.  Carotid ultrasound could be considered for further evaluation, when  and as deemed clinically appropriate.      Electronically Signed    ByAmanda Pea M.D.    On: 06/01/2014 20:55         Verified By: JEFFREY . CHANG, M.D.,    No Known Allergies:   Vital Signs/Nurse's Notes: **Vital Signs.:   02-Feb-16 04:25  Vital Signs Type Routine   Temperature Temperature (F) 98.3  Celsius 36.8  Temperature Source oral  Pulse Pulse 92  Respirations Respirations 21  Systolic BP Systolic BP 867  Diastolic BP (mmHg) Diastolic BP (mmHg) 81  Mean BP 95  Pulse Ox % Pulse Ox % 93  Oxygen Delivery Room Air/ 21 %    Impression 59 year old female with history of DM2, HTN, ongoing tobacco abuse, anxiety, and neuropathy who presented to [facility] with symptoms of "not feeling well." Symptoms included left nasal bridge pain and abdominal pain. She was found to have sinusitis and an elevated troponin.  1. Elevated troponin: consistent with a NSTEMI, troponin now greater than 13 long history of smoking, diabetes, strong family history of coronary artery disease. High risk of underlying atherosclerosis. We have discussed the lab findings with her and have strongly recommended cardiac catheterization given troponin greater than 13.  -Continue heparin gtt at this time she has tentatively been placed on the schedule for cardiac catheterization at 9:30 on Wednesday, February 3 she will discuss cardiac catheterization with her family. --unable to start beta blockers, ace inhibitors at this time given her low blood pressure Would continue aspirin,  would hold off on statin given total cholesterol 94, LDL 31  2. Abdominal pain: -CT scan with stable diverticulosis, no explanation of her symptoms -Improved with GI cocktail  -Start omeprazole 40 mg daily   -Consider decreasing metformin to 500 mg bid and following up with PCP unable to exclude abdominal pain as anginal equivalent given her elevated troponin  3. Ongoing tobacco abuse: -Cessation is a must -Nicotine patch   4. DM2: -Uncontrolled -SSI  5. Sinusitis: -Augmentin per IM  6. SIRS: -Hypotension has corrected -On Augmentin   Electronic Signatures: Rise Mu ([name])  (Signed 02-Feb-16 11:03)  Authored: General Aspect/Present Illness, History and Physical Exam, Review of System,  Family & Social History, Past Medical History, Home Medications, Labs, EKG , Radiology, Allergies, Vital Signs/Nurse's Notes, Impression/Plan Ida Rogue (MD)  (Signed 02-Feb-16 17:30)  Authored: General Aspect/Present Illness, History and Physical Exam, Review of System, Family & Social History, Health Issues, Labs, EKG , Radiology, Vital Signs/Nurse's Notes, Impression/Plan  Co-Signer: General Aspect/Present Illness, Home Medications, Allergies   Last Updated: 02-Feb-16 17:30 by Ida Rogue (MD)

## 2014-08-30 NOTE — Discharge Summary (Signed)
PATIENT NAME:  Melinda [name], Melinda [name] MR#:  [id number] DATE OF BIRTH:  February 24, 1956  DATE OF ADMISSION:  06/02/2014 DATE OF DISCHARGE:  06/03/2014  PRIMARY CARE PHYSICIAN:  Unknown.  ADMISSION COMPLAINT:  Pressure in the nose and face.   DISCHARGE DIAGNOSES: 1.  Acute non-ST elevation myocardial infarction.  2.  Coronary artery disease with multiple-vessel disease.  3.  Diabetes mellitus type 2 with poor control, hemoglobin A1c of 11.5.  4.  Acute kidney injury.  5.  Sinusitis.   CONSULTATIONS: Minna Merritts, cardiology.   PROCEDURES: 1.  CT of the head without contrast on February 1 shows no acute intracranial pathology. No fracture-dislocation of the maxillofacial structures. Mild small vessel ischemic microangiopathy. Partial opacification of the right-sided sphenoid sinus. Mild calcification of carotid bifurcations bilaterally.  2.  CT scan of the abdomen and pelvis shows no acute findings. Stable chronic diverticulosis without diverticulitis. Normal appendix.  3.  A 2-D echocardiogram on February 2 shows left ventricular ejection fraction 50 to 55%. Low-normal global left ventricular systolic function. Moderate-to-mild hypokinesis of the inferior posterior and inferior lateral walls. Normal right ventricular size and systolic function.   HISTORY OF PRESENT ILLNESS:  This 59 year old African-American woman with past medical history of diabetes presented with 3 days of not feeling well. She had had nausea and chest pain, particularly with exertion. On presentation to the Emergency Room, she was tachycardic and hypotensive. She was complaining mainly of left-sided facial pain.   HOSPITAL COURSE BY PROBLEM:  1.  Acute non-ST elevation myocardial infarction. The patient had a very atypical presentation with presenting symptoms being left-sided facial pain and abdominal pain. Cardiac enzymes were ordered on admission and were initially negative, but when cycled increased dramatically up to a high  of 30. She was started on a heparin drip and aspirin. She was seen by cardiology and taken for cardiac catheterization on February 3. Cardiac catheterization showed multivessel disease, and she was immediately transported to [facility] for further evaluation and possibility of coronary artery bypass grafting.  2.  Diabetes mellitus type 2, uncontrolled with hemoglobin A1c greater than 11. She will need diabetes education, insulin, and close followup once she is discharged from [facility].  3.  Acute kidney injury. Baseline creatinine is not known. Presenting creatinine was slightly elevated at 1.42; this improved with hydration to 1.13. Her kidney function is normal at the time of discharge.  4.  Suspected sepsis. I suspect actually that her hypotension, tachycardia, and leukocytosis were due to non-ST elevation myocardial infarction. Chest x-ray, urinalysis, CT of the abdomen and pelvis, and maxillofacial CT were all negative. She did have slight sinusitis and was started on Augmentin for this. Blood cultures were negative. Urine culture was negative. Again, I do not think that this patient was septic.  5.  Ongoing tobacco abuse. Tobacco cessation counseling provided multiple times during the admission.   DISCHARGE PHYSICAL EXAMINATION:  I was unable to see the patient before her cardiac catheterization, and she was transferred directly from the catheterization lab. I did not have the opportunity to physically see this patient. Her discharge vitals were a temperature of 98.4, pulse 76, respirations 19, blood pressure 105/69, and oxygenation 94% on room air   DISCHARGE MEDICATIONS:  She is being transferred to another facility. Her medications will be determined by the accepting facility.   CONDITION ON DISCHARGE:  The patient is in stable condition and in need of further evaluation and treatment with services that are not available at  [facility] including cardiothoracic surgery for possible coronary  artery bypass grafting.   DISPOSITION:  Transfer to [facility].   TIME SPENT ON DISCHARGE:  30 minutes.   ____________________________ Earleen Newport. Volanda Napoleon, MD cpw:nb D: 06/04/2014 74:16:38 ET T: 06/05/2014 07:10:34 ET JOB#: [id number]  cc: Barnetta Chapel P. Volanda Napoleon, MD, <Dictator> Aldean Jewett MD ELECTRONICALLY SIGNED 06/10/2014 18:27

## 2014-09-02 ENCOUNTER — Ambulatory Visit
Admission: RE | Admit: 2014-09-02 | Discharge: 2014-09-02 | Disposition: A | Discharge status: Home / Self Care | Payer: Disability Insurance | Source: Ambulatory Visit | Attending: Nurse Practitioner | Admitting: Nurse Practitioner

## 2014-09-02 ENCOUNTER — Other Ambulatory Visit: Payer: Self-pay | Admitting: Thoracic Surgery

## 2014-09-02 ENCOUNTER — Ambulatory Visit
Admission: RE | Admit: 2014-09-02 | Discharge: 2014-09-02 | Disposition: A | Discharge status: Home / Self Care | Payer: Disability Insurance | Source: Ambulatory Visit | Attending: Thoracic Surgery | Admitting: Thoracic Surgery

## 2014-09-02 DIAGNOSIS — I739 Peripheral vascular disease, unspecified: Secondary | ICD-10-CM | POA: Diagnosis not present

## 2014-09-02 DIAGNOSIS — I2510 Atherosclerotic heart disease of native coronary artery without angina pectoris: Secondary | ICD-10-CM | POA: Insufficient documentation

## 2014-09-02 DIAGNOSIS — E119 Type 2 diabetes mellitus without complications: Secondary | ICD-10-CM | POA: Insufficient documentation

## 2014-09-02 DIAGNOSIS — K219 Gastro-esophageal reflux disease without esophagitis: Secondary | ICD-10-CM | POA: Diagnosis not present

## 2014-09-02 DIAGNOSIS — I252 Old myocardial infarction: Secondary | ICD-10-CM | POA: Diagnosis not present

## 2014-09-02 DIAGNOSIS — M179 Osteoarthritis of knee, unspecified: Secondary | ICD-10-CM

## 2014-09-02 DIAGNOSIS — M1711 Unilateral primary osteoarthritis, right knee: Secondary | ICD-10-CM | POA: Insufficient documentation

## 2014-09-02 DIAGNOSIS — I10 Essential (primary) hypertension: Secondary | ICD-10-CM | POA: Diagnosis not present

## 2014-09-02 DIAGNOSIS — Z951 Presence of aortocoronary bypass graft: Secondary | ICD-10-CM | POA: Diagnosis not present

## 2014-09-02 DIAGNOSIS — M1710 Unilateral primary osteoarthritis, unspecified knee: Secondary | ICD-10-CM

## 2014-09-02 DIAGNOSIS — S82001S Unspecified fracture of right patella, sequela: Secondary | ICD-10-CM | POA: Diagnosis not present

## 2014-09-02 DIAGNOSIS — M1712 Unilateral primary osteoarthritis, left knee: Secondary | ICD-10-CM | POA: Diagnosis not present

## 2014-09-02 DIAGNOSIS — Z9889 Other specified postprocedural states: Secondary | ICD-10-CM | POA: Diagnosis not present

## 2014-09-14 ENCOUNTER — Other Ambulatory Visit: Payer: Self-pay

## 2014-09-14 DIAGNOSIS — I214 Non-ST elevation (NSTEMI) myocardial infarction: Secondary | ICD-10-CM

## 2014-09-22 ENCOUNTER — Encounter: Payer: Self-pay | Admitting: Cardiovascular Disease

## 2014-09-22 ENCOUNTER — Ambulatory Visit (INDEPENDENT_AMBULATORY_CARE_PROVIDER_SITE_OTHER): Payer: Medicaid Other | Admitting: Cardiovascular Disease

## 2014-09-22 VITALS — BP 112/70 | HR 96 | Ht 68.0 in | Wt 235.5 lb

## 2014-09-22 DIAGNOSIS — I10 Essential (primary) hypertension: Secondary | ICD-10-CM | POA: Diagnosis not present

## 2014-09-22 DIAGNOSIS — E119 Type 2 diabetes mellitus without complications: Secondary | ICD-10-CM

## 2014-09-22 DIAGNOSIS — E669 Obesity, unspecified: Secondary | ICD-10-CM

## 2014-09-22 DIAGNOSIS — IMO0001 Reserved for inherently not codable concepts without codable children: Secondary | ICD-10-CM

## 2014-09-22 DIAGNOSIS — Z951 Presence of aortocoronary bypass graft: Secondary | ICD-10-CM | POA: Diagnosis not present

## 2014-09-22 DIAGNOSIS — Z720 Tobacco use: Secondary | ICD-10-CM | POA: Diagnosis not present

## 2014-09-22 DIAGNOSIS — Z794 Long term (current) use of insulin: Secondary | ICD-10-CM

## 2014-09-22 DIAGNOSIS — I2510 Atherosclerotic heart disease of native coronary artery without angina pectoris: Secondary | ICD-10-CM

## 2014-09-22 NOTE — Assessment & Plan Note (Signed)
She is working closely with primary care, reports having improved diabetes numbers

## 2014-09-22 NOTE — Progress Notes (Signed)
Patient ID: Melinda [name], female    DOB: [birth year], 59 y.o.   MRN: [id number]  HPI Comments: 59 y.o. female with h/o CAD s/p 4 vessel CABG on 06/08/2014 secondary to NSTEMI, IDDM, HTN, ongoing tobacco abuse, obesity, anxiety, and peripheral neuropathy who was recently admitted to [facility] from 2/1-2/3 for NSTEMI. After undergoing cardiac cath on 2/3 that showed severe 3 vessel disease she was transferred to [facility] from 2/3-2/13 for . 4 vessel CABG (LIMA to LAD, SVG to Diagonal, SVG to OM, and SVG to PDA) She presents today for follow-up in the clinic for her coronary artery disease   in general she reports that she is doing well. She does have some numbness in her mediastinal area   she is active, denies any significant chest pain. She is nervous about going back to work. She could not afford the cardiac rehabilitation as she is still applying for Medicaid.  She does report having some periodic numbness down her left arm over the past month. Reports compliance with her medications She continues to smoke but is down to 3 cigarettes per day She has seen primary care and reports that she is doing better with her diabetes numbers  EKG today shows normal sinus rhythm with rate 96 bpm, ST and T wave abnormality precordial leads, inferior leads of the past medical history   other past medical history   presented to [facility] ED on 2/1 with headache, neck pain, nasal pain, chest tightness, nausea, and just overall did not feel well. Work up in the [facility] ED confirmed sinus infection, which she started on Augmentin for. She was also found to be hypotensive with BP 85/51. She received IV fluids with improvement in her BP to 120s/80s. Negative abdominal CT. She did have an elevated troponin at <0.02->1.30-->3.60-->7.80-->13.26-->23.00-->30.00. She was placed on a heparin gtt. Cardiac cath showed 95% stenosis mLAD, occlusion ostial OM1, 70% stenosis LCx, 95% stenosis mRCA, EF 45%. She underwent successful cabg on 06/08/2014  at [facility]. She tolerated this procedure well. Echo at [facility] showed EF 40-45%, HK of entire inferior and inferolateral wall c/w infarction of RCA or LCx, GR2DD, and mild TR. She was discharged on Lopressor 12.5 mg bid with labile BP. She was not started on an ACEi 2/2 labile BP. Hgb was stable at discharge. She was continued on Lasix 40 mg daily. She was advised to follow up with her PCP for her blood sugars (pre op A1C 10.5%).       No Known Allergies  Current Outpatient Prescriptions on File Prior to Visit  Medication Sig Dispense Refill  . acetaminophen (TYLENOL) 500 MG tablet Take 1,000 mg by mouth every 8 (eight) hours as needed.     Marland Kitchen aspirin EC 81 MG tablet Take 1 tablet (81 mg total) by mouth daily. 90 tablet 3  . atorvastatin (LIPITOR) 80 MG tablet Take 1 tablet (80 mg total) by mouth daily at 6 PM. 30 tablet 1  . clopidogrel (PLAVIX) 75 MG tablet Take 1 tablet (75 mg total) by mouth daily. 90 tablet 2  . furosemide (LASIX) 40 MG tablet Take 40 mg by mouth daily.    Marland Kitchen gabapentin (NEURONTIN) 600 MG tablet Take 800 mg by mouth 3 (three) times daily.     . Insulin Glargine (LANTUS) 100 UNIT/ML Solostar Pen Inject 35 Units into the skin 2 (two) times daily.    Marland Kitchen lisinopril (PRINIVIL,ZESTRIL) 2.5 MG tablet Take 1 tablet (2.5 mg total) by mouth daily. (Patient taking differently: Take  2.5 mg by mouth daily. 1/2 TABLET DAILY) 30 tablet 6  . metFORMIN (GLUCOPHAGE) 500 MG tablet Take 1,000 mg by mouth 2 (two) times daily.    . metoprolol tartrate (LOPRESSOR) 25 MG tablet Take 1 tablet (25 mg total) by mouth 2 (two) times daily. 60 tablet 6  . oxyCODONE (OXY IR/ROXICODONE) 5 MG immediate release tablet Take 1-2 tablets (5-10 mg total) by mouth every 6 (six) hours as needed for severe pain. 20 tablet 0   No current facility-administered medications on file prior to visit.    Past Medical History  Diagnosis Date  . HTN (hypertension)   . Tobacco abuse   . Anxiety   . Peripheral neuropathy   .  Obesity   . Coronary artery disease     a. NSTEMI 06/2014; b. 4 vessel CABG 06/08/2014 LIMA to LAD, SVG to Diagonal, SVG to OM, & SVG to PDA  . Ischemic cardiomyopathy     a. echo 06/04/14 EF 40-45%, HK of entire inferolateral and inferior myocardium c.w infarct of RCA/LCx, GR2DD, mild MR  . IDDM (insulin dependent diabetes mellitus)   . GERD (gastroesophageal reflux disease)   . OA (osteoarthritis) of knee     Past Surgical History  Procedure Laterality Date  . Breast biopsy    . Toe amputation Right 12/2011    rt middle toe  . Cardiac catheterization  06/2014    95% stenosis mLAD, occlusion ostial OM1, 70% stenosis LCx, 95% stenosis mRCA, EF 45%.  . US echocardiography  06/2014    EF 50-55%, HK of inf/post/inferolat walls, Ao sclerosis  . Ct abd w & pelvis wo cm  06/2014    nl liver, gallbladder, spleen, mild diverticular changes, no bowel wall inflammation, appendix nl, no hernia, no other sig abnormalities  . Coronary artery bypass graft N/A 06/08/2014    Procedure: CORONARY ARTERY BYPASS GRAFTING (CABG);  Surgeon: Grace Isaac, MD;  Location: [place];  Service: Open Heart Surgery;  Laterality: N/A;  Times 4 using left internal mammary artery to LAD artery and endoscopically harvested bilateral saphenous vein to Obtuse Marginal, Diagonal and Posterior Descending coronary arteries.  Darden Dates without cardioversion N/A 06/08/2014    Procedure: TRANSESOPHAGEAL ECHOCARDIOGRAM (TEE);  Surgeon: Grace Isaac, MD;  Location: [place];  Service: Open Heart Surgery;  Laterality: N/A;    Social History  reports that she has been smoking Cigarettes.  She has a 10 pack-year smoking history. She has never used smokeless tobacco. She reports that she does not drink alcohol or use illicit drugs.  Family History family history includes CAD in her father and mother; Hypertension in her mother.   Review of Systems  Constitutional: Negative.   Respiratory: Negative.   Cardiovascular: Negative.    Gastrointestinal: Negative.   Musculoskeletal: Negative.   Skin: Negative.   Neurological: Negative.        Left arm numbness Numbness in her mediastinal area  Hematological: Negative.   Psychiatric/Behavioral: Negative.   All other systems reviewed and are negative.   BP 112/70 mmHg  Pulse 96  Ht 5\' 8"  (1.727 m)  Wt 235 lb 8 oz (106.822 kg)  BMI 35.82 kg/m2  Physical Exam  Constitutional: She is oriented to person, place, and time. She appears well-developed and well-nourished.  HENT:  Head: Normocephalic.  Nose: Nose normal.  Mouth/Throat: Oropharynx is clear and moist.  Eyes: Conjunctivae are normal. Pupils are equal, round, and reactive to light.  Neck: Normal range of motion. Neck supple. No  JVD present.  Cardiovascular: Normal rate, regular rhythm, S1 normal, S2 normal, normal heart sounds and intact distal pulses.  Exam reveals no gallop and no friction rub.   No murmur heard. Pulmonary/Chest: Effort normal and breath sounds normal. No respiratory distress. She has no wheezes. She has no rales. She exhibits no tenderness.  Abdominal: Soft. Bowel sounds are normal. She exhibits no distension. There is no tenderness.  Musculoskeletal: Normal range of motion. She exhibits no edema or tenderness.  Lymphadenopathy:    She has no cervical adenopathy.  Neurological: She is alert and oriented to person, place, and time. Coordination normal.  Skin: Skin is warm and dry. No rash noted. No erythema.  Psychiatric: She has a normal mood and affect. Her behavior is normal. Judgment and thought content normal.    Assessment and Plan  Nursing note and vitals reviewed.

## 2014-09-22 NOTE — Assessment & Plan Note (Signed)
Currently with no symptoms of angina. No further workup at this time. Continue current medication regimen. 

## 2014-09-22 NOTE — Assessment & Plan Note (Signed)
Blood pressure is well controlled on today's visit. No changes made to the medications. 

## 2014-09-22 NOTE — Assessment & Plan Note (Signed)
Work note provided today to go back to work. No restrictions. If she has difficulty with certain activities, new work note could be provided with limitations This was discussed with her and she is in agreement

## 2014-09-22 NOTE — Patient Instructions (Addendum)
You are doing well. No medication changes were made.  We will write a work note  Please call us if you have new issues that need to be addressed before your next appt.  Your physician wants you to follow-up in: 6 months.  You will receive a reminder letter in the mail two months in advance. If you don't receive a letter, please call our office to schedule the follow-up appointment.

## 2014-09-22 NOTE — Assessment & Plan Note (Signed)
We have encouraged continued exercise, careful diet management in an effort to lose weight. 

## 2014-09-22 NOTE — Assessment & Plan Note (Signed)
We have encouraged her to continue to work on weaning her cigarettes and smoking cessation. She will continue to work on this and does not want any assistance with chantix.  

## 2014-11-16 DIAGNOSIS — K219 Gastro-esophageal reflux disease without esophagitis: Secondary | ICD-10-CM | POA: Diagnosis present

## 2014-11-17 ENCOUNTER — Encounter: Payer: Disability Insurance | Attending: Cardiovascular Disease | Admitting: *Deleted

## 2014-11-17 ENCOUNTER — Encounter: Payer: Self-pay | Admitting: *Deleted

## 2014-11-17 VITALS — Ht 69.5 in | Wt 237.1 lb

## 2014-11-17 DIAGNOSIS — Z951 Presence of aortocoronary bypass graft: Secondary | ICD-10-CM | POA: Insufficient documentation

## 2014-11-17 NOTE — Progress Notes (Signed)
Cardiac Individual Treatment Plan  Patient Details  Name: Melinda [name] MRN: [id number] Date of Birth: 30-Sep-1955 Referring Provider:  Minna Merritts, MD  Initial Encounter Date: Date: 11/17/14  Visit Diagnosis: S/P CABG x 4  Patient's Home Medications on Admission:  Current outpatient prescriptions:  .  acetaminophen (TYLENOL) 500 MG tablet, Take 1,000 mg by mouth every 8 (eight) hours as needed. , Disp: , Rfl:  .  aspirin EC 81 MG tablet, Take 1 tablet (81 mg total) by mouth daily., Disp: 90 tablet, Rfl: 3 .  atorvastatin (LIPITOR) 80 MG tablet, Take 1 tablet (80 mg total) by mouth daily at 6 PM., Disp: 30 tablet, Rfl: 1 .  clopidogrel (PLAVIX) 75 MG tablet, Take 1 tablet (75 mg total) by mouth daily., Disp: 90 tablet, Rfl: 2 .  furosemide (LASIX) 40 MG tablet, Take 40 mg by mouth daily., Disp: , Rfl:  .  gabapentin (NEURONTIN) 600 MG tablet, Take 800 mg by mouth 3 (three) times daily. , Disp: , Rfl:  .  Insulin Glargine (LANTUS) 100 UNIT/ML Solostar Pen, Inject 37 Units into the skin 2 (two) times daily. , Disp: , Rfl:  .  lisinopril (PRINIVIL,ZESTRIL) 2.5 MG tablet, Take 1 tablet (2.5 mg total) by mouth daily. (Patient taking differently: Take 2.5 mg by mouth daily. 1/2 TABLET DAILY), Disp: 30 tablet, Rfl: 6 .  metFORMIN (GLUCOPHAGE) 500 MG tablet, Take 1,000 mg by mouth 2 (two) times daily., Disp: , Rfl:  .  metoprolol tartrate (LOPRESSOR) 25 MG tablet, Take 1 tablet (25 mg total) by mouth 2 (two) times daily., Disp: 60 tablet, Rfl: 6 .  oxyCODONE (OXY IR/ROXICODONE) 5 MG immediate release tablet, Take 1-2 tablets (5-10 mg total) by mouth every 6 (six) hours as needed for severe pain. (Patient not taking: Reported on 11/17/2014), Disp: 20 tablet, Rfl: 0  Past Medical History: Past Medical History  Diagnosis Date  . HTN (hypertension)   . Tobacco abuse   . Anxiety   . Peripheral neuropathy   . Obesity   . Coronary artery disease     a. NSTEMI 06/2014; b. 4 vessel CABG 06/08/2014  LIMA to LAD, SVG to Diagonal, SVG to OM, & SVG to PDA  . Ischemic cardiomyopathy     a. echo 06/04/14 EF 40-45%, HK of entire inferolateral and inferior myocardium c.w infarct of RCA/LCx, GR2DD, mild MR  . IDDM (insulin dependent diabetes mellitus)   . GERD (gastroesophageal reflux disease)   . OA (osteoarthritis) of knee     Tobacco Use: History  Smoking status  . Current Every Day Smoker -- 0.25 packs/day for 40 years  . Types: Cigarettes  Smokeless tobacco  . Never Used    Labs: Recent Review Flowsheet Data    Labs for ITP Cardiac and Pulmonary Rehab Latest Ref Rng 06/08/2014 06/08/2014 06/08/2014 06/08/2014 06/09/2014   PHART 7.350 - 7.450 7.333(L) 7.331(L) 7.366 - -   PCO2ART 35.0 - 45.0 mmHg 45.0 47.6(H) 48.0(H) - -   HCO3 20.0 - 24.0 mEq/L 24.1(H) 25.4(H) 27.6(H) - -   TCO2 0 - 100 mmol/L 25 27 29 23 22    ACIDBASEDEF 0.0 - 2.0 mmol/L 2.0 1.0 - - -   O2SAT - 86.0 95.0 93.0 - -       Exercise Target Goals: Date: 11/17/14  Exercise Program Goal: Individual exercise prescription set with THRR, safety & activity barriers. Participant demonstrates ability to understand and report RPE using BORG scale, to self-measure pulse accurately, and to acknowledge the importance  of the exercise prescription.  Exercise Prescription Goal: Starting with aerobic activity 30 plus minutes a day, 3 days per week for initial exercise prescription. Provide home exercise prescription and guidelines that participant acknowledges understanding prior to discharge.  Activity Barriers & Risk Stratification:     Activity Barriers & Risk Stratification - 11/17/14 1545    Activity Barriers & Risk Stratification   Activity Barriers Arthritis;Joint Problems;Back Problems;Balance Concerns;History of Falls   Risk Stratification High      6 Minute Walk:     6 Minute Walk      11/17/14 1428       6 Minute Walk   Phase Initial     Distance 450 feet     Walk Time 3.75 minutes     Resting HR 99 bpm      Resting BP 118/70 mmHg     Max Ex. HR 120 bpm     Max Ex. BP 144/64 mmHg     RPE 17     Symptoms Yes (comment)     Comments Painful cramping in the calf muscles on R and L side        Initial Exercise Prescription:     Initial Exercise Prescription - 11/17/14 1400    Date of Initial Exercise Prescription   Date 11/17/14   Treadmill   MPH 0.8   Grade 0   Minutes 5  Use intervals to complete time goals   Bike   Level 0.2   Watts 10   Minutes 10   Recumbant Bike   Level 1   RPM 30   Watts 10   Minutes 10   NuStep   Level 1   Watts 15   Minutes 10   Arm Ergometer   Level 1   Watts 8   Minutes 10   Arm/Foot Ergometer   Level 4   Watts 12   Minutes 10   Cybex   Level 1   RPM 40   Minutes 10   Recumbant Elliptical   Level 1   RPM 30   Watts 10   Minutes 10   REL-XR   Level 1   Watts 30   Minutes 10   Prescription Details   Frequency (times per week) 3   Duration Progress to 30 minutes of continuous aerobic without signs/symptoms of physical distress   Intensity   THRR REST +  30   Ratings of Perceived Exertion 11-15   Progression Continue progressive overload as per policy without signs/symptoms or physical distress.   Resistance Training   Training Prescription Yes   Weight 1   Reps 10-15      Exercise Prescription Changes:   Discharge Exercise Prescription (Final Exercise Prescription Changes):   Nutrition:  Target Goals: Understanding of nutrition guidelines, daily intake of sodium 1500mg , cholesterol 200mg , calories 30% from fat and 7% or less from saturated fats, daily to have 5 or more servings of fruits and vegetables.  Biometrics:     Pre Biometrics - 11/17/14 1425    Pre Biometrics   Height 5' 9.5" (1.765 m)   Weight 237 lb 1.6 oz (107.548 kg)   BMI (Calculated) 34.6         Post Biometrics - 11/17/14 1425     Post  Biometrics   Waist Circumference 43.5 inches   Hip Circumference 54.5 inches   Waist to Hip Ratio 0.8 %       Nutrition Therapy Plan and Nutrition Goals:  Nutrition Discharge: Rate Your Plate Scores:   Nutrition Goals Re-Evaluation:   Psychosocial: Target Goals: Acknowledge presence or absence of depression, maximize coping skills, provide positive support system. Participant is able to verbalize types and ability to use techniques and skills needed for reducing stress and depression.  Initial Review & Psychosocial Screening:     Initial Psych Review & Screening - 11/17/14 1603    Initial Review   Current issues with Current Depression   Family Dynamics   Good Support System? Yes  Patient lives alone.  Relies on her sister to drive her places.  Sister seems supportive.  Pt has church affiliation, as she attends [employer].  She states she does feel depressed.  Has Celexa for anxiety, but has not been taking it.     Barriers   Psychosocial barriers to participate in program The patient should benefit from training in stress management and relaxation.   Screening Interventions   Interventions Encouraged to exercise;Program counselor consult      Quality of Life Scores:     Quality of Life - 11/17/14 1606    Quality of Life Scores   Health/Function Pre 4.64 %   Socioeconomic Pre 12.36 %   Psych/Spiritual Pre 10.5 %   Family Pre 20 %   GLOBAL Pre 9.19 %      PHQ-9:     Recent Review Flowsheet Data    Depression screen [facility] 2/9 11/17/2014   Decreased Interest 1   Down, Depressed, Hopeless 1   PHQ - 2 Score 2   Altered sleeping 1   Tired, decreased energy 3   Change in appetite 3   Feeling bad or failure about yourself  1   Trouble concentrating 0   Moving slowly or fidgety/restless 1   Suicidal thoughts 0   PHQ-9 Score 11   Difficult doing work/chores Very difficult      Psychosocial Evaluation and Intervention:   Psychosocial Re-Evaluation:   Vocational Rehabilitation: Provide vocational rehab assistance to qualifying candidates.    Vocational Rehab Evaluation & Intervention:     Vocational Rehab - 11/17/14 1554    Initial Vocational Rehab Evaluation & Intervention   Assessment shows need for Vocational Rehabilitation No      Education: Education Goals: Education classes will be provided on a weekly basis, covering required topics. Participant will state understanding/return demonstration of topics presented.  Learning Barriers/Preferences:     Learning Barriers/Preferences - 11/17/14 1546    Learning Barriers/Preferences   Learning Barriers Sight;Exercise Concerns   Learning Preferences Video      Education Topics: General Nutrition Guidelines/Fats and Fiber: -Group instruction provided by verbal, written material, models and posters to present the general guidelines for heart healthy nutrition. Gives an explanation and review of dietary fats and fiber.   Controlling Sodium/Reading Food Labels: -Group verbal and written material supporting the discussion of sodium use in heart healthy nutrition. Review and explanation with models, verbal and written materials for utilization of the food label.   Exercise Physiology & Risk Factors: - Group verbal and written instruction with models to review the exercise physiology of the cardiovascular system and associated critical values. Details cardiovascular disease risk factors and the goals associated with each risk factor.   Aerobic Exercise & Resistance Training: - Gives group verbal and written discussion on the health impact of inactivity. On the components of aerobic and resistive training programs and the benefits of this training and how to safely progress through these programs.  Flexibility, Balance, General Exercise Guidelines: - Provides group verbal and written instruction on the benefits of flexibility and balance training programs. Provides general exercise guidelines with specific guidelines to those with heart or lung disease. Demonstration and  skill practice provided.   Stress Management: - Provides group verbal and written instruction about the health risks of elevated stress, cause of high stress, and healthy ways to reduce stress.   Depression: - Provides group verbal and written instruction on the correlation between heart/lung disease and depressed mood, treatment options, and the stigmas associated with seeking treatment.   Anatomy & Physiology of the Heart: - Group verbal and written instruction and models provide basic cardiac anatomy and physiology, with the coronary electrical and arterial systems. Review of: AMI, Angina, Valve disease, Heart Failure, Cardiac Arrhythmia, Pacemakers, and the ICD.   Cardiac Procedures: - Group verbal and written instruction and models to describe the testing methods done to diagnose heart disease. Reviews the outcomes of the test results. Describes the treatment choices: Medical Management, Angioplasty, or Coronary Bypass Surgery.   Cardiac Medications: - Group verbal and written instruction to review commonly prescribed medications for heart disease. Reviews the medication, class of the drug, and side effects. Includes the steps to properly store meds and maintain the prescription regimen.   Go Sex-Intimacy & Heart Disease, Get SMART - Goal Setting: - Group verbal and written instruction through game format to discuss heart disease and the return to sexual intimacy. Provides group verbal and written material to discuss and apply goal setting through the application of the S.M.A.R.T. Method.   Other Matters of the Heart: - Provides group verbal, written materials and models to describe Heart Failure, Angina, Valve Disease, and Diabetes in the realm of heart disease. Includes description of the disease process and treatment options available to the cardiac patient.   Exercise & Equipment Safety: - Individual verbal instruction and demonstration of equipment use and safety with use of  the equipment.          Cardiac Rehab from 11/17/2014 in [facility] Cardiac Rehab   Date  11/17/14   Educator  DW   Instruction Review Code  1- partially meets, needs review/practice      Infection Prevention: - Provides verbal and written material to individual with discussion of infection control including proper hand washing and proper equipment cleaning during exercise session.      Cardiac Rehab from 11/17/2014 in [facility] Cardiac Rehab   Date  11/17/14   Educator  DW   Instruction Review Code  2- meets goals/outcomes      Falls Prevention: - Provides verbal and written material to individual with discussion of falls prevention and safety.      Cardiac Rehab from 11/17/2014 in [facility]iac [facility]   Date  11/17/14   Educator  DW   Instruction Review Code  2- meets goals/outcomes      Diabetes: - Individual verbal and written instruction to review signs/symptoms of diabetes, desired ranges of glucose level fasting, after meals and with exercise. Advice that pre and post exercise glucose checks will be done for 3 sessions at entry of program.      Cardiac Rehab from 11/17/2014 in [facility] Cardiac Rehab   Date  11/17/14   Educator  DW   Instruction Review Code  2- meets goals/outcomes       Knowledge Questionnaire Score:     Knowledge Questionnaire Score - 11/17/14 1550    Knowledge Questionnaire Score   Pre Score  16/28      Personal Goals and Risk Factors at Admission:     Personal Goals and Risk Factors at Admission - 11/17/14 1600    Personal Goals and Risk Factors on Admission    Weight Management Yes   Admit Weight 237 lb 1.6 oz (107.548 kg)   Goal Weight 180 lb (81.647 kg)   Increase Aerobic Exercise and Physical Activity Yes   Intervention While in program, learn and follow the exercise prescription taught. Start at a low level workload and increase workload after able to maintain previous level for 30 minutes. Increase time before increasing intensity.   Quit Smoking  Yes   Number of packs per day 0.25 pack per day currently.     Intervention Utilize your health care professional team to help with smoking cessation while in the program. Your doctor can prescribe medications to aid in cessation. The program can provide information and counseling as needed.   Take Less Medication Yes   Intervention Learn your risk factors and begin the lifestyle modifications for risk factor control during your time in the program.   Understand more about Heart/Pulmonary Disease. Yes   Intervention While in program utilize professionals for any questions, and attend the education sessions. Great websites to use are www.americanheart.org or www.lung.org for reliable information.   Diabetes Yes   Goal Blood glucose control identified by blood glucose values, HgbA1C. Participant verbalizes understanding of the signs/symptoms of hyper/hypo glycemia, proper foot care and importance of medication and nutrition plan for blood glucose control.   Intervention Provide nutrition & aerobic exercise along with prescribed medications to achieve blood glucose in normal ranges: Fasting 65-99 mg/dL   Hypertension Yes   Goal Participant will see blood pressure controlled within the values of 140/30mm/Hg or within value directed by their physician.   Intervention Provide nutrition & aerobic exercise along with prescribed medications to achieve BP 140/90 or less.   Lipids Yes   Goal Cholesterol controlled with medications as prescribed, with individualized exercise RX and with personalized nutrition plan. Value goals: LDL < 70mg , HDL > 40mg . Participant states understanding of desired cholesterol values and following prescriptions.   Intervention Provide nutrition & aerobic exercise along with prescribed medications to achieve LDL 70mg , HDL >40mg .   Stress Yes   Goal To meet with psychosocial counselor for stress and relaxation information and guidance. To state understanding of performing relaxation  techniques and or identifying personal stressors.   Intervention Provide education on types of stress, identifiying stressors, and ways to cope with stress. Provide demonstration and active practice of relaxation techniques.      Personal Goals and Risk Factors Review:    Personal Goals Discharge:     Comments:  Patient to start Cardiac Rehab on November 23, 2014 at 3:45 p.m.

## 2014-11-17 NOTE — Patient Instructions (Signed)
Patient Instructions  Patient Details  Name: Melinda [name] MRN: [id number] Date of Birth: Aug 01, 1955 Referring Provider:  Minna Merritts, MD  Below are the personal goals you chose as well as exercise and nutrition goals. Our goal is to help you keep on track towards obtaining and maintaining your goals. We will be discussing your progress on these goals with you throughout the program.  Initial Exercise Prescription:     Initial Exercise Prescription - 11/17/14 1400    Date of Initial Exercise Prescription   Date 11/17/14   Treadmill   MPH 0.8   Grade 0   Minutes 5  Use intervals to complete time goals   Bike   Level 0.2   Watts 10   Minutes 10   Recumbant Bike   Level 1   RPM 30   Watts 10   Minutes 10   NuStep   Level 1   Watts 15   Minutes 10   Arm Ergometer   Level 1   Watts 8   Minutes 10   Arm/Foot Ergometer   Level 4   Watts 12   Minutes 10   Cybex   Level 1   RPM 40   Minutes 10   Recumbant Elliptical   Level 1   RPM 30   Watts 10   Minutes 10   REL-XR   Level 1   Watts 30   Minutes 10   Prescription Details   Frequency (times per week) 3   Duration Progress to 30 minutes of continuous aerobic without signs/symptoms of physical distress   Intensity   THRR REST +  30   Ratings of Perceived Exertion 11-15   Progression Continue progressive overload as per policy without signs/symptoms or physical distress.   Resistance Training   Training Prescription Yes   Weight 1   Reps 10-15      Exercise Goals: Frequency: Be able to perform aerobic exercise three times per week working toward 3-5 days per week.  Intensity: Work with a perceived exertion of 11 (fairly light) - 15 (hard) as tolerated. Follow your new exercise prescription and watch for changes in prescription as you progress with the program. Changes will be reviewed with you when they are made.  Duration: You should be able to do 30 minutes of continuous aerobic exercise in  addition to a 5 minute warm-up and a 5 minute cool-down routine.  Nutrition Goals: Your personal nutrition goals will be established when you do your nutrition analysis with the dietician.  The following are nutrition guidelines to follow: Cholesterol < 200mg /day Sodium < 1500mg /day Fiber: Women over 50 yrs - 21 grams per day  Personal Goals:     Personal Goals and Risk Factors at Admission - 11/17/14 1600    Personal Goals and Risk Factors on Admission    Weight Management Yes   Admit Weight 237 lb 1.6 oz (107.548 kg)   Goal Weight 180 lb (81.647 kg)   Increase Aerobic Exercise and Physical Activity Yes   Intervention While in program, learn and follow the exercise prescription taught. Start at a low level workload and increase workload after able to maintain previous level for 30 minutes. Increase time before increasing intensity.   Quit Smoking Yes   Number of packs per day 0.25 pack per day currently.     Intervention Utilize your health care professional team to help with smoking cessation while in the program. Your doctor can prescribe medications to aid in  cessation. The program can provide information and counseling as needed.   Take Less Medication Yes   Intervention Learn your risk factors and begin the lifestyle modifications for risk factor control during your time in the program.   Understand more about Heart/Pulmonary Disease. Yes   Intervention While in program utilize professionals for any questions, and attend the education sessions. Great websites to use are www.americanheart.org or www.lung.org for reliable information.   Diabetes Yes   Goal Blood glucose control identified by blood glucose values, HgbA1C. Participant verbalizes understanding of the signs/symptoms of hyper/hypo glycemia, proper foot care and importance of medication and nutrition plan for blood glucose control.   Intervention Provide nutrition & aerobic exercise along with prescribed medications to  achieve blood glucose in normal ranges: Fasting 65-99 mg/dL   Hypertension Yes   Goal Participant will see blood pressure controlled within the values of 140/70mm/Hg or within value directed by their physician.   Intervention Provide nutrition & aerobic exercise along with prescribed medications to achieve BP 140/90 or less.   Lipids Yes   Goal Cholesterol controlled with medications as prescribed, with individualized exercise RX and with personalized nutrition plan. Value goals: LDL < 70mg , HDL > 40mg . Participant states understanding of desired cholesterol values and following prescriptions.   Intervention Provide nutrition & aerobic exercise along with prescribed medications to achieve LDL 70mg , HDL >40mg .   Stress Yes   Goal To meet with psychosocial counselor for stress and relaxation information and guidance. To state understanding of performing relaxation techniques and or identifying personal stressors.   Intervention Provide education on types of stress, identifiying stressors, and ways to cope with stress. Provide demonstration and active practice of relaxation techniques.      Tobacco Use Initial Evaluation: History  Smoking status  . Current Every Day Smoker -- 0.25 packs/day for 40 years  . Types: Cigarettes  Smokeless tobacco  . Never Used    Copy of goals given to participant.

## 2014-11-23 ENCOUNTER — Ambulatory Visit: Payer: PRIVATE HEALTH INSURANCE

## 2014-11-25 ENCOUNTER — Ambulatory Visit: Payer: PRIVATE HEALTH INSURANCE

## 2014-11-26 ENCOUNTER — Ambulatory Visit: Payer: PRIVATE HEALTH INSURANCE

## 2014-11-27 ENCOUNTER — Ambulatory Visit: Payer: PRIVATE HEALTH INSURANCE

## 2014-11-30 ENCOUNTER — Ambulatory Visit: Payer: PRIVATE HEALTH INSURANCE

## 2014-12-02 ENCOUNTER — Ambulatory Visit: Payer: PRIVATE HEALTH INSURANCE

## 2014-12-03 ENCOUNTER — Ambulatory Visit: Payer: PRIVATE HEALTH INSURANCE

## 2014-12-04 ENCOUNTER — Ambulatory Visit: Payer: PRIVATE HEALTH INSURANCE

## 2014-12-07 ENCOUNTER — Ambulatory Visit: Payer: PRIVATE HEALTH INSURANCE

## 2014-12-08 ENCOUNTER — Encounter: Payer: Self-pay | Admitting: *Deleted

## 2014-12-09 ENCOUNTER — Ambulatory Visit: Payer: PRIVATE HEALTH INSURANCE

## 2014-12-10 ENCOUNTER — Encounter: Payer: Self-pay | Admitting: *Deleted

## 2014-12-10 ENCOUNTER — Telehealth: Payer: Self-pay | Admitting: *Deleted

## 2014-12-10 ENCOUNTER — Ambulatory Visit: Payer: PRIVATE HEALTH INSURANCE

## 2014-12-10 DIAGNOSIS — Z951 Presence of aortocoronary bypass graft: Secondary | ICD-10-CM

## 2014-12-10 NOTE — Progress Notes (Signed)
Cardiac Individual Treatment Plan  Patient Details  Name: Melinda [name] MRN: [id number] Date of Birth: 1956/04/14 Referring Provider:  Minna Merritts, MD  Initial Encounter Date:    Visit Diagnosis: S/P CABG x 4  Patient's Home Medications on Admission:  Current outpatient prescriptions:  .  acetaminophen (TYLENOL) 500 MG tablet, Take 1,000 mg by mouth every 8 (eight) hours as needed. , Disp: , Rfl:  .  aspirin EC 81 MG tablet, Take 1 tablet (81 mg total) by mouth daily., Disp: 90 tablet, Rfl: 3 .  atorvastatin (LIPITOR) 80 MG tablet, Take 1 tablet (80 mg total) by mouth daily at 6 PM., Disp: 30 tablet, Rfl: 1 .  clopidogrel (PLAVIX) 75 MG tablet, Take 1 tablet (75 mg total) by mouth daily., Disp: 90 tablet, Rfl: 2 .  furosemide (LASIX) 40 MG tablet, Take 40 mg by mouth daily., Disp: , Rfl:  .  gabapentin (NEURONTIN) 600 MG tablet, Take 800 mg by mouth 3 (three) times daily. , Disp: , Rfl:  .  Insulin Glargine (LANTUS) 100 UNIT/ML Solostar Pen, Inject 37 Units into the skin 2 (two) times daily. , Disp: , Rfl:  .  lisinopril (PRINIVIL,ZESTRIL) 2.5 MG tablet, Take 1 tablet (2.5 mg total) by mouth daily. (Patient taking differently: Take 2.5 mg by mouth daily. 1/2 TABLET DAILY), Disp: 30 tablet, Rfl: 6 .  metFORMIN (GLUCOPHAGE) 500 MG tablet, Take 1,000 mg by mouth 2 (two) times daily., Disp: , Rfl:  .  metoprolol tartrate (LOPRESSOR) 25 MG tablet, Take 1 tablet (25 mg total) by mouth 2 (two) times daily., Disp: 60 tablet, Rfl: 6 .  oxyCODONE (OXY IR/ROXICODONE) 5 MG immediate release tablet, Take 1-2 tablets (5-10 mg total) by mouth every 6 (six) hours as needed for severe pain. (Patient not taking: Reported on 11/17/2014), Disp: 20 tablet, Rfl: 0  Past Medical History: Past Medical History  Diagnosis Date  . HTN (hypertension)   . Tobacco abuse   . Anxiety   . Peripheral neuropathy   . Obesity   . Coronary artery disease     a. NSTEMI 06/2014; b. 4 vessel CABG 06/08/2014 LIMA to LAD,  SVG to Diagonal, SVG to OM, & SVG to PDA  . Ischemic cardiomyopathy     a. echo 06/04/14 EF 40-45%, HK of entire inferolateral and inferior myocardium c.w infarct of RCA/LCx, GR2DD, mild MR  . IDDM (insulin dependent diabetes mellitus)   . GERD (gastroesophageal reflux disease)   . OA (osteoarthritis) of knee     Tobacco Use: History  Smoking status  . Current Every Day Smoker -- 0.25 packs/day for 40 years  . Types: Cigarettes  Smokeless tobacco  . Never Used    Labs: Recent Review Flowsheet Data    Labs for ITP Cardiac and Pulmonary Rehab Latest Ref Rng 06/08/2014 06/08/2014 06/08/2014 06/08/2014 06/09/2014   PHART 7.350 - 7.450 7.333(L) 7.331(L) 7.366 - -   PCO2ART 35.0 - 45.0 mmHg 45.0 47.6(H) 48.0(H) - -   HCO3 20.0 - 24.0 mEq/L 24.1(H) 25.4(H) 27.6(H) - -   TCO2 0 - 100 mmol/L 25 27 29 23 22    ACIDBASEDEF 0.0 - 2.0 mmol/L 2.0 1.0 - - -   O2SAT - 86.0 95.0 93.0 - -       Exercise Target Goals:    Exercise Program Goal: Individual exercise prescription set with THRR, safety & activity barriers. Participant demonstrates ability to understand and report RPE using BORG scale, to self-measure pulse accurately, and to acknowledge the importance  of the exercise prescription.  Exercise Prescription Goal: Starting with aerobic activity 30 plus minutes a day, 3 days per week for initial exercise prescription. Provide home exercise prescription and guidelines that participant acknowledges understanding prior to discharge.  Activity Barriers & Risk Stratification:     Activity Barriers & Risk Stratification - 11/17/14 1545    Activity Barriers & Risk Stratification   Activity Barriers Arthritis;Joint Problems;Back Problems;Balance Concerns;History of Falls   Risk Stratification High      6 Minute Walk:     6 Minute Walk      11/17/14 1428       6 Minute Walk   Phase Initial     Distance 450 feet     Walk Time 3.75 minutes     Resting HR 99 bpm     Resting BP 118/70 mmHg      Max Ex. HR 120 bpm     Max Ex. BP 144/64 mmHg     RPE 17     Symptoms Yes (comment)     Comments Painful cramping in the calf muscles on R and L side        Initial Exercise Prescription:     Initial Exercise Prescription - 11/17/14 1400    Date of Initial Exercise Prescription   Date 11/17/14   Treadmill   MPH 0.8   Grade 0   Minutes 5  Use intervals to complete time goals   Bike   Level 0.2   Watts 10   Minutes 10   Recumbant Bike   Level 1   RPM 30   Watts 10   Minutes 10   NuStep   Level 1   Watts 15   Minutes 10   Arm Ergometer   Level 1   Watts 8   Minutes 10   Arm/Foot Ergometer   Level 4   Watts 12   Minutes 10   Cybex   Level 1   RPM 40   Minutes 10   Recumbant Elliptical   Level 1   RPM 30   Watts 10   Minutes 10   REL-XR   Level 1   Watts 30   Minutes 10   Prescription Details   Frequency (times per week) 3   Duration Progress to 30 minutes of continuous aerobic without signs/symptoms of physical distress   Intensity   THRR REST +  30   Ratings of Perceived Exertion 11-15   Progression Continue progressive overload as per policy without signs/symptoms or physical distress.   Resistance Training   Training Prescription Yes   Weight 1   Reps 10-15      Exercise Prescription Changes:     Exercise Prescription Changes      12/08/14 0600           Exercise Review   Progression No  Has not started yet          Discharge Exercise Prescription (Final Exercise Prescription Changes):     Exercise Prescription Changes - 12/08/14 0600    Exercise Review   Progression No  Has not started yet      Nutrition:  Target Goals: Understanding of nutrition guidelines, daily intake of sodium 1500mg , cholesterol 200mg , calories 30% from fat and 7% or less from saturated fats, daily to have 5 or more servings of fruits and vegetables.  Biometrics:     Pre Biometrics - 11/17/14 1425    Pre Biometrics   Height 5' 9.5" (1.765  m)    Weight 237 lb 1.6 oz (107.548 kg)   BMI (Calculated) 34.6         Post Biometrics - 11/17/14 1425     Post  Biometrics   Waist Circumference 43.5 inches   Hip Circumference 54.5 inches   Waist to Hip Ratio 0.8 %      Nutrition Therapy Plan and Nutrition Goals:   Nutrition Discharge: Rate Your Plate Scores:   Nutrition Goals Re-Evaluation:   Psychosocial: Target Goals: Acknowledge presence or absence of depression, maximize coping skills, provide positive support system. Participant is able to verbalize types and ability to use techniques and skills needed for reducing stress and depression.  Initial Review & Psychosocial Screening:     Initial Psych Review & Screening - 11/17/14 1603    Initial Review   Current issues with Current Depression   Family Dynamics   Good Support System? Yes  Patient lives alone.  Relies on her sister to drive her places.  Sister seems supportive.  Pt has church affiliation, as she attends [employer].  She states she does feel depressed.  Has Celexa for anxiety, but has not been taking it.     Barriers   Psychosocial barriers to participate in program The patient should benefit from training in stress management and relaxation.   Screening Interventions   Interventions Encouraged to exercise;Program counselor consult      Quality of Life Scores:     Quality of Life - 11/17/14 1606    Quality of Life Scores   Health/Function Pre 4.64 %   Socioeconomic Pre 12.36 %   Psych/Spiritual Pre 10.5 %   Family Pre 20 %   GLOBAL Pre 9.19 %      PHQ-9:     Recent Review Flowsheet Data    Depression screen [facility] 2/9 11/17/2014   Decreased Interest 1   Down, Depressed, Hopeless 1   PHQ - 2 Score 2   Altered sleeping 1   Tired, decreased energy 3   Change in appetite 3   Feeling bad or failure about yourself  1   Trouble concentrating 0   Moving slowly or fidgety/restless 1   Suicidal thoughts 0   PHQ-9 Score 11    Difficult doing work/chores Very difficult      Psychosocial Evaluation and Intervention:   Psychosocial Re-Evaluation:   Vocational Rehabilitation: Provide vocational rehab assistance to qualifying candidates.   Vocational Rehab Evaluation & Intervention:     Vocational Rehab - 11/17/14 1554    Initial Vocational Rehab Evaluation & Intervention   Assessment shows need for Vocational Rehabilitation No      Education: Education Goals: Education classes will be provided on a weekly basis, covering required topics. Participant will state understanding/return demonstration of topics presented.  Learning Barriers/Preferences:     Learning Barriers/Preferences - 11/17/14 1546    Learning Barriers/Preferences   Learning Barriers Sight;Exercise Concerns   Learning Preferences Video      Education Topics: General Nutrition Guidelines/Fats and Fiber: -Group instruction provided by verbal, written material, models and posters to present the general guidelines for heart healthy nutrition. Gives an explanation and review of dietary fats and fiber.   Controlling Sodium/Reading Food Labels: -Group verbal and written material supporting the discussion of sodium use in heart healthy nutrition. Review and explanation with models, verbal and written materials for utilization of the food label.   Exercise Physiology & Risk Factors: - Group verbal and written instruction with models to  review the exercise physiology of the cardiovascular system and associated critical values. Details cardiovascular disease risk factors and the goals associated with each risk factor.   Aerobic Exercise & Resistance Training: - Gives group verbal and written discussion on the health impact of inactivity. On the components of aerobic and resistive training programs and the benefits of this training and how to safely progress through these programs.   Flexibility, Balance, General Exercise Guidelines: -  Provides group verbal and written instruction on the benefits of flexibility and balance training programs. Provides general exercise guidelines with specific guidelines to those with heart or lung disease. Demonstration and skill practice provided.   Stress Management: - Provides group verbal and written instruction about the health risks of elevated stress, cause of high stress, and healthy ways to reduce stress.   Depression: - Provides group verbal and written instruction on the correlation between heart/lung disease and depressed mood, treatment options, and the stigmas associated with seeking treatment.   Anatomy & Physiology of the Heart: - Group verbal and written instruction and models provide basic cardiac anatomy and physiology, with the coronary electrical and arterial systems. Review of: AMI, Angina, Valve disease, Heart Failure, Cardiac Arrhythmia, Pacemakers, and the ICD.   Cardiac Procedures: - Group verbal and written instruction and models to describe the testing methods done to diagnose heart disease. Reviews the outcomes of the test results. Describes the treatment choices: Medical Management, Angioplasty, or Coronary Bypass Surgery.   Cardiac Medications: - Group verbal and written instruction to review commonly prescribed medications for heart disease. Reviews the medication, class of the drug, and side effects. Includes the steps to properly store meds and maintain the prescription regimen.   Go Sex-Intimacy & Heart Disease, Get SMART - Goal Setting: - Group verbal and written instruction through game format to discuss heart disease and the return to sexual intimacy. Provides group verbal and written material to discuss and apply goal setting through the application of the S.M.A.R.T. Method.   Other Matters of the Heart: - Provides group verbal, written materials and models to describe Heart Failure, Angina, Valve Disease, and Diabetes in the realm of heart disease.  Includes description of the disease process and treatment options available to the cardiac patient.   Exercise & Equipment Safety: - Individual verbal instruction and demonstration of equipment use and safety with use of the equipment.          Cardiac Rehab from 11/17/2014 in [facility] Cardiac Rehab   Date  11/17/14   Educator  DW   Instruction Review Code  1- partially meets, needs review/practice      Infection Prevention: - Provides verbal and written material to individual with discussion of infection control including proper hand washing and proper equipment cleaning during exercise session.      Cardiac Rehab from 11/17/2014 in [facility] Cardiac Rehab   Date  11/17/14   Educator  DW   Instruction Review Code  2- meets goals/outcomes      Falls Prevention: - Provides verbal and written material to individual with discussion of falls prevention and safety.      Cardiac Rehab from 11/17/2014 in [facility] Cardiac Rehab   Date  11/17/14   Educator  DW   Instruction Review Code  2- meets goals/outcomes      Diabetes: - Individual verbal and written instruction to review signs/symptoms of diabetes, desired ranges of glucose level fasting, after meals and with exercise. Advice that pre and post exercise glucose checks will be  done for 3 sessions at entry of program.      Cardiac Rehab from 11/17/2014 in [facility] Cardiac Rehab   Date  11/17/14   Educator  DW   Instruction Review Code  2- meets goals/outcomes       Knowledge Questionnaire Score:     Knowledge Questionnaire Score - 11/17/14 1550    Knowledge Questionnaire Score   Pre Score  16/28      Personal Goals and Risk Factors at Admission:     Personal Goals and Risk Factors at Admission - 11/17/14 1600    Personal Goals and Risk Factors on Admission    Weight Management Yes   Admit Weight 237 lb 1.6 oz (107.548 kg)   Goal Weight 180 lb (81.647 kg)   Increase Aerobic Exercise and Physical Activity Yes   Intervention While in  program, learn and follow the exercise prescription taught. Start at a low level workload and increase workload after able to maintain previous level for 30 minutes. Increase time before increasing intensity.   Quit Smoking Yes   Number of packs per day 0.25 pack per day currently.     Intervention Utilize your health care professional team to help with smoking cessation while in the program. Your doctor can prescribe medications to aid in cessation. The program can provide information and counseling as needed.   Take Less Medication Yes   Intervention Learn your risk factors and begin the lifestyle modifications for risk factor control during your time in the program.   Understand more about Heart/Pulmonary Disease. Yes   Intervention While in program utilize professionals for any questions, and attend the education sessions. Great websites to use are www.americanheart.org or www.lung.org for reliable information.   Diabetes Yes   Goal Blood glucose control identified by blood glucose values, HgbA1C. Participant verbalizes understanding of the signs/symptoms of hyper/hypo glycemia, proper foot care and importance of medication and nutrition plan for blood glucose control.   Intervention Provide nutrition & aerobic exercise along with prescribed medications to achieve blood glucose in normal ranges: Fasting 65-99 mg/dL   Hypertension Yes   Goal Participant will see blood pressure controlled within the values of 140/78mm/Hg or within value directed by their physician.   Intervention Provide nutrition & aerobic exercise along with prescribed medications to achieve BP 140/90 or less.   Lipids Yes   Goal Cholesterol controlled with medications as prescribed, with individualized exercise RX and with personalized nutrition plan. Value goals: LDL < 70mg , HDL > 40mg . Participant states understanding of desired cholesterol values and following prescriptions.   Intervention Provide nutrition & aerobic exercise  along with prescribed medications to achieve LDL 70mg , HDL >40mg .   Stress Yes   Goal To meet with psychosocial counselor for stress and relaxation information and guidance. To state understanding of performing relaxation techniques and or identifying personal stressors.   Intervention Provide education on types of stress, identifiying stressors, and ways to cope with stress. Provide demonstration and active practice of relaxation techniques.      Personal Goals and Risk Factors Review:    Personal Goals Discharge:     Comments: 30 day review   Has not started because of transportation concerns. Melinda [name] is working on transportation and expects to start soon.

## 2014-12-10 NOTE — Telephone Encounter (Signed)
Spoke with Melinda [name] today about when to start program.  She was denied by [employer] for transportation because she missed a phone apopintment.  She needs to reapply.  She intends on reapplying and will let us know when she has transportation.

## 2014-12-11 ENCOUNTER — Ambulatory Visit: Payer: PRIVATE HEALTH INSURANCE

## 2014-12-14 ENCOUNTER — Ambulatory Visit: Payer: PRIVATE HEALTH INSURANCE

## 2014-12-16 ENCOUNTER — Ambulatory Visit: Payer: PRIVATE HEALTH INSURANCE

## 2014-12-16 ENCOUNTER — Other Ambulatory Visit: Payer: Self-pay | Admitting: *Deleted

## 2014-12-16 DIAGNOSIS — Z951 Presence of aortocoronary bypass graft: Secondary | ICD-10-CM

## 2014-12-17 ENCOUNTER — Ambulatory Visit: Payer: PRIVATE HEALTH INSURANCE

## 2014-12-18 ENCOUNTER — Ambulatory Visit: Payer: PRIVATE HEALTH INSURANCE | Admitting: *Deleted

## 2014-12-18 NOTE — Progress Notes (Signed)
Daily Session Note  Patient Details  Name: Melinda [name] MRN: [id number] Date of Birth: 26-Aug-1955 Referring Provider:  Georgena Spurling, MD  Encounter Date: 12/18/2014  Check In:     Session Check In - 12/18/14 0953    Check-In   Staff Present Nyoka Cowden RN;Shanda Cadotte RN, [name]  Hessie Knows,    ER physicians immediately available to respond to emergencies See telemetry face sheet for immediately available ER MD   Medication changes reported     No   Fall or balance concerns reported    No   Warm-up and Cool-down Performed on first and last piece of equipment   Pain Assessment   Currently in Pain? Yes   Pain Location Chest   Pain Onset More than a month ago   Pain Frequency Intermittent   Multiple Pain Sites No         Goals Met:  Proper associated with RPD/PD & O2 Sat Exercise tolerated well No report of cardiac concerns or symptoms  Goals Unmet:  Not Applicable  Goals Comments:    Dr. Emily Filbert is Medical Director for [place] and [facility]. This encounter was created in error - please disregard.

## 2014-12-21 ENCOUNTER — Ambulatory Visit: Payer: PRIVATE HEALTH INSURANCE

## 2014-12-23 ENCOUNTER — Ambulatory Visit: Payer: PRIVATE HEALTH INSURANCE

## 2014-12-24 ENCOUNTER — Ambulatory Visit: Payer: PRIVATE HEALTH INSURANCE

## 2014-12-25 ENCOUNTER — Ambulatory Visit: Payer: PRIVATE HEALTH INSURANCE

## 2014-12-28 ENCOUNTER — Ambulatory Visit: Payer: PRIVATE HEALTH INSURANCE

## 2014-12-30 ENCOUNTER — Ambulatory Visit: Payer: PRIVATE HEALTH INSURANCE

## 2014-12-31 ENCOUNTER — Ambulatory Visit: Payer: PRIVATE HEALTH INSURANCE

## 2015-01-01 ENCOUNTER — Ambulatory Visit: Payer: PRIVATE HEALTH INSURANCE

## 2015-01-06 ENCOUNTER — Ambulatory Visit: Payer: PRIVATE HEALTH INSURANCE

## 2015-01-07 ENCOUNTER — Ambulatory Visit: Payer: PRIVATE HEALTH INSURANCE

## 2015-01-08 ENCOUNTER — Ambulatory Visit: Payer: PRIVATE HEALTH INSURANCE

## 2015-01-11 ENCOUNTER — Ambulatory Visit: Payer: PRIVATE HEALTH INSURANCE

## 2015-01-11 ENCOUNTER — Telehealth: Payer: Self-pay | Admitting: *Deleted

## 2015-01-11 ENCOUNTER — Encounter: Payer: Self-pay | Admitting: *Deleted

## 2015-01-11 NOTE — Telephone Encounter (Signed)
Still working on transportation with [employer]

## 2015-01-11 NOTE — Progress Notes (Signed)
Cardiac Individual Treatment Plan  Patient Details  Name: Melinda [name] MRN: [id number] Date of Birth: 02/09/1956 Referring Provider:  Dr. Rockey Situ   Initial Encounter Date: 11/17/2014   Visit Diagnosis: S/P CABG Patient's Home Medications on Admission:  Current outpatient prescriptions:  .  acetaminophen (TYLENOL) 500 MG tablet, Take 1,000 mg by mouth every 8 (eight) hours as needed. , Disp: , Rfl:  .  aspirin EC 81 MG tablet, Take 1 tablet (81 mg total) by mouth daily., Disp: 90 tablet, Rfl: 3 .  atorvastatin (LIPITOR) 80 MG tablet, Take 1 tablet (80 mg total) by mouth daily at 6 PM., Disp: 30 tablet, Rfl: 1 .  clopidogrel (PLAVIX) 75 MG tablet, Take 1 tablet (75 mg total) by mouth daily., Disp: 90 tablet, Rfl: 2 .  furosemide (LASIX) 40 MG tablet, Take 40 mg by mouth daily., Disp: , Rfl:  .  gabapentin (NEURONTIN) 600 MG tablet, Take 800 mg by mouth 3 (three) times daily. , Disp: , Rfl:  .  Insulin Glargine (LANTUS) 100 UNIT/ML Solostar Pen, Inject 37 Units into the skin 2 (two) times daily. , Disp: , Rfl:  .  lisinopril (PRINIVIL,ZESTRIL) 2.5 MG tablet, Take 1 tablet (2.5 mg total) by mouth daily. (Patient taking differently: Take 2.5 mg by mouth daily. 1/2 TABLET DAILY), Disp: 30 tablet, Rfl: 6 .  metFORMIN (GLUCOPHAGE) 500 MG tablet, Take 1,000 mg by mouth 2 (two) times daily., Disp: , Rfl:  .  metoprolol tartrate (LOPRESSOR) 25 MG tablet, Take 1 tablet (25 mg total) by mouth 2 (two) times daily., Disp: 60 tablet, Rfl: 6 .  oxyCODONE (OXY IR/ROXICODONE) 5 MG immediate release tablet, Take 1-2 tablets (5-10 mg total) by mouth every 6 (six) hours as needed for severe pain. (Patient not taking: Reported on 11/17/2014), Disp: 20 tablet, Rfl: 0  Past Medical History: Past Medical History  Diagnosis Date  . HTN (hypertension)   . Tobacco abuse   . Anxiety   . Peripheral neuropathy   . Obesity   . Coronary artery disease     a. NSTEMI 06/2014; b. 4 vessel CABG 06/08/2014 LIMA to LAD, SVG to  Diagonal, SVG to OM, & SVG to PDA  . Ischemic cardiomyopathy     a. echo 06/04/14 EF 40-45%, HK of entire inferolateral and inferior myocardium c.w infarct of RCA/LCx, GR2DD, mild MR  . IDDM (insulin dependent diabetes mellitus)   . GERD (gastroesophageal reflux disease)   . OA (osteoarthritis) of knee     Tobacco Use: History  Smoking status  . Current Every Day Smoker -- 0.25 packs/day for 40 years  . Types: Cigarettes  Smokeless tobacco  . Never Used    Labs: Recent Review Flowsheet Data    Labs for ITP Cardiac and Pulmonary Rehab Latest Ref Rng 06/08/2014 06/08/2014 06/08/2014 06/08/2014 06/09/2014   PHART 7.350 - 7.450 7.333(L) 7.331(L) 7.366 - -   PCO2ART 35.0 - 45.0 mmHg 45.0 47.6(H) 48.0(H) - -   HCO3 20.0 - 24.0 mEq/L 24.1(H) 25.4(H) 27.6(H) - -   TCO2 0 - 100 mmol/L 25 27 29 23 22    ACIDBASEDEF 0.0 - 2.0 mmol/L 2.0 1.0 - - -   O2SAT - 86.0 95.0 93.0 - -       Exercise Target Goals:    Exercise Program Goal: Individual exercise prescription set with THRR, safety & activity barriers. Participant demonstrates ability to understand and report RPE using BORG scale, to self-measure pulse accurately, and to acknowledge the importance of the exercise prescription.  Exercise Prescription Goal: Starting with aerobic activity 30 plus minutes a day, 3 days per week for initial exercise prescription. Provide home exercise prescription and guidelines that participant acknowledges understanding prior to discharge.  Activity Barriers & Risk Stratification:     Activity Barriers & Risk Stratification - 11/17/14 1545    Activity Barriers & Risk Stratification   Activity Barriers Arthritis;Joint Problems;Back Problems;Balance Concerns;History of Falls   Risk Stratification High      6 Minute Walk:     6 Minute Walk      11/17/14 1428       6 Minute Walk   Phase Initial     Distance 450 feet     Walk Time 3.75 minutes     Resting HR 99 bpm     Resting BP 118/70 mmHg     Max  Ex. HR 120 bpm     Max Ex. BP 144/64 mmHg     RPE 17     Symptoms Yes (comment)     Comments Painful cramping in the calf muscles on R and L side        Initial Exercise Prescription:     Initial Exercise Prescription - 11/17/14 1400    Date of Initial Exercise Prescription   Date 11/17/14   Treadmill   MPH 0.8   Grade 0   Minutes 5  Use intervals to complete time goals   Bike   Level 0.2   Watts 10   Minutes 10   Recumbant Bike   Level 1   RPM 30   Watts 10   Minutes 10   NuStep   Level 1   Watts 15   Minutes 10   Arm Ergometer   Level 1   Watts 8   Minutes 10   Arm/Foot Ergometer   Level 4   Watts 12   Minutes 10   Cybex   Level 1   RPM 40   Minutes 10   Recumbant Elliptical   Level 1   RPM 30   Watts 10   Minutes 10   REL-XR   Level 1   Watts 30   Minutes 10   Prescription Details   Frequency (times per week) 3   Duration Progress to 30 minutes of continuous aerobic without signs/symptoms of physical distress   Intensity   THRR REST +  30   Ratings of Perceived Exertion 11-15   Progression Continue progressive overload as per policy without signs/symptoms or physical distress.   Resistance Training   Training Prescription Yes   Weight 1   Reps 10-15      Exercise Prescription Changes:     Exercise Prescription Changes      12/08/14 0600 01/11/15 1400         Exercise Review   Progression No  Has not started yet No  Melinda [name] has been absent the entire month. Plans to return as          Discharge Exercise Prescription (Final Exercise Prescription Changes):     Exercise Prescription Changes - 01/11/15 1400    Exercise Review   Progression No  Melinda [name] has been absent the entire month. Plans to return as       Nutrition:  Target Goals: Understanding of nutrition guidelines, daily intake of sodium 1500mg , cholesterol 200mg , calories 30% from fat and 7% or less from saturated fats, daily to have 5 or more servings of fruits and  vegetables.  Biometrics:  Pre Biometrics - 11/17/14 1425    Pre Biometrics   Height 5' 9.5" (1.765 m)   Weight 237 lb 1.6 oz (107.548 kg)   BMI (Calculated) 34.6         Post Biometrics - 11/17/14 1425     Post  Biometrics   Waist Circumference 43.5 inches   Hip Circumference 54.5 inches   Waist to Hip Ratio 0.8 %      Nutrition Therapy Plan and Nutrition Goals:   Nutrition Discharge: Rate Your Plate Scores:   Nutrition Goals Re-Evaluation:   Psychosocial: Target Goals: Acknowledge presence or absence of depression, maximize coping skills, provide positive support system. Participant is able to verbalize types and ability to use techniques and skills needed for reducing stress and depression.  Initial Review & Psychosocial Screening:     Initial Psych Review & Screening - 11/17/14 1603    Initial Review   Current issues with Current Depression   Family Dynamics   Good Support System? Yes  Patient lives alone.  Relies on her sister to drive her places.  Sister seems supportive.  Pt has church affiliation, as she attends [employer].  She states she does feel depressed.  Has Celexa for anxiety, but has not been taking it.     Barriers   Psychosocial barriers to participate in program The patient should benefit from training in stress management and relaxation.   Screening Interventions   Interventions Encouraged to exercise;Program counselor consult      Quality of Life Scores:     Quality of Life - 11/17/14 1606    Quality of Life Scores   Health/Function Pre 4.64 %   Socioeconomic Pre 12.36 %   Psych/Spiritual Pre 10.5 %   Family Pre 20 %   GLOBAL Pre 9.19 %      PHQ-9:     Recent Review Flowsheet Data    Depression screen [facility] 2/9 11/17/2014   Decreased Interest 1   Down, Depressed, Hopeless 1   PHQ - 2 Score 2   Altered sleeping 1   Tired, decreased energy 3   Change in appetite 3   Feeling bad or failure about yourself  1    Trouble concentrating 0   Moving slowly or fidgety/restless 1   Suicidal thoughts 0   PHQ-9 Score 11   Difficult doing work/chores Very difficult      Psychosocial Evaluation and Intervention:   Psychosocial Re-Evaluation:   Vocational Rehabilitation: Provide vocational rehab assistance to qualifying candidates.   Vocational Rehab Evaluation & Intervention:     Vocational Rehab - 11/17/14 1554    Initial Vocational Rehab Evaluation & Intervention   Assessment shows need for Vocational Rehabilitation No      Education: Education Goals: Education classes will be provided on a weekly basis, covering required topics. Participant will state understanding/return demonstration of topics presented.  Learning Barriers/Preferences:     Learning Barriers/Preferences - 11/17/14 1546    Learning Barriers/Preferences   Learning Barriers Sight;Exercise Concerns   Learning Preferences Video      Education Topics: General Nutrition Guidelines/Fats and Fiber: -Group instruction provided by verbal, written material, models and posters to present the general guidelines for heart healthy nutrition. Gives an explanation and review of dietary fats and fiber.   Controlling Sodium/Reading Food Labels: -Group verbal and written material supporting the discussion of sodium use in heart healthy nutrition. Review and explanation with models, verbal and written materials for utilization of the food label.  Exercise Physiology & Risk Factors: - Group verbal and written instruction with models to review the exercise physiology of the cardiovascular system and associated critical values. Details cardiovascular disease risk factors and the goals associated with each risk factor.   Aerobic Exercise & Resistance Training: - Gives group verbal and written discussion on the health impact of inactivity. On the components of aerobic and resistive training programs and the benefits of this training  and how to safely progress through these programs.   Flexibility, Balance, General Exercise Guidelines: - Provides group verbal and written instruction on the benefits of flexibility and balance training programs. Provides general exercise guidelines with specific guidelines to those with heart or lung disease. Demonstration and skill practice provided.   Stress Management: - Provides group verbal and written instruction about the health risks of elevated stress, cause of high stress, and healthy ways to reduce stress.   Depression: - Provides group verbal and written instruction on the correlation between heart/lung disease and depressed mood, treatment options, and the stigmas associated with seeking treatment.   Anatomy & Physiology of the Heart: - Group verbal and written instruction and models provide basic cardiac anatomy and physiology, with the coronary electrical and arterial systems. Review of: AMI, Angina, Valve disease, Heart Failure, Cardiac Arrhythmia, Pacemakers, and the ICD.   Cardiac Procedures: - Group verbal and written instruction and models to describe the testing methods done to diagnose heart disease. Reviews the outcomes of the test results. Describes the treatment choices: Medical Management, Angioplasty, or Coronary Bypass Surgery.   Cardiac Medications: - Group verbal and written instruction to review commonly prescribed medications for heart disease. Reviews the medication, class of the drug, and side effects. Includes the steps to properly store meds and maintain the prescription regimen.   Go Sex-Intimacy & Heart Disease, Get SMART - Goal Setting: - Group verbal and written instruction through game format to discuss heart disease and the return to sexual intimacy. Provides group verbal and written material to discuss and apply goal setting through the application of the S.M.A.R.T. Method.   Other Matters of the Heart: - Provides group verbal, written  materials and models to describe Heart Failure, Angina, Valve Disease, and Diabetes in the realm of heart disease. Includes description of the disease process and treatment options available to the cardiac patient.   Exercise & Equipment Safety: - Individual verbal instruction and demonstration of equipment use and safety with use of the equipment.          Cardiac Rehab from 11/17/2014 in [facility] Cardiac Rehab   Date  11/17/14   Educator  DW   Instruction Review Code  1- partially meets, needs review/practice      Infection Prevention: - Provides verbal and written material to individual with discussion of infection control including proper hand washing and proper equipment cleaning during exercise session.      Cardiac Rehab from 11/17/2014 in [facility] Cardiac Rehab   Date  11/17/14   Educator  DW   Instruction Review Code  2- meets goals/outcomes      Falls Prevention: - Provides verbal and written material to individual with discussion of falls prevention and safety.      Cardiac Rehab from 11/17/2014 in [facility] Cardiac Rehab   Date  11/17/14   Educator  DW   Instruction Review Code  2- meets goals/outcomes      Diabetes: - Individual verbal and written instruction to review signs/symptoms of diabetes, desired ranges of glucose level fasting, after  meals and with exercise. Advice that pre and post exercise glucose checks will be done for 3 sessions at entry of program.      Cardiac Rehab from 11/17/2014 in [facility] Cardiac Rehab   Date  11/17/14   Educator  DW   Instruction Review Code  2- meets goals/outcomes       Knowledge Questionnaire Score:     Knowledge Questionnaire Score - 11/17/14 1550    Knowledge Questionnaire Score   Pre Score  16/28      Personal Goals and Risk Factors at Admission:     Personal Goals and Risk Factors at Admission - 11/17/14 1600    Personal Goals and Risk Factors on Admission    Weight Management Yes   Admit Weight 237 lb 1.6 oz (107.548 kg)    Goal Weight 180 lb (81.647 kg)   Increase Aerobic Exercise and Physical Activity Yes   Intervention While in program, learn and follow the exercise prescription taught. Start at a low level workload and increase workload after able to maintain previous level for 30 minutes. Increase time before increasing intensity.   Quit Smoking Yes   Number of packs per day 0.25 pack per day currently.     Intervention Utilize your health care professional team to help with smoking cessation while in the program. Your doctor can prescribe medications to aid in cessation. The program can provide information and counseling as needed.   Take Less Medication Yes   Intervention Learn your risk factors and begin the lifestyle modifications for risk factor control during your time in the program.   Understand more about Heart/Pulmonary Disease. Yes   Intervention While in program utilize professionals for any questions, and attend the education sessions. Great websites to use are www.americanheart.org or www.lung.org for reliable information.   Diabetes Yes   Goal Blood glucose control identified by blood glucose values, HgbA1C. Participant verbalizes understanding of the signs/symptoms of hyper/hypo glycemia, proper foot care and importance of medication and nutrition plan for blood glucose control.   Intervention Provide nutrition & aerobic exercise along with prescribed medications to achieve blood glucose in normal ranges: Fasting 65-99 mg/dL   Hypertension Yes   Goal Participant will see blood pressure controlled within the values of 140/53mm/Hg or within value directed by their physician.   Intervention Provide nutrition & aerobic exercise along with prescribed medications to achieve BP 140/90 or less.   Lipids Yes   Goal Cholesterol controlled with medications as prescribed, with individualized exercise RX and with personalized nutrition plan. Value goals: LDL < 70mg , HDL > 40mg . Participant states  understanding of desired cholesterol values and following prescriptions.   Intervention Provide nutrition & aerobic exercise along with prescribed medications to achieve LDL 70mg , HDL >40mg .   Stress Yes   Goal To meet with psychosocial counselor for stress and relaxation information and guidance. To state understanding of performing relaxation techniques and or identifying personal stressors.   Intervention Provide education on types of stress, identifiying stressors, and ways to cope with stress. Provide demonstration and active practice of relaxation techniques.      Personal Goals and Risk Factors Review:      Goals and Risk Factor Review      01/11/15 1431           Other Goal   Goals Progress/Improvement seen  No       Comments Teona has been out over 2 months waiting for transportation to program. She is applying and is having  conflict with setting up the transportation          Personal Goals Discharge:     Comments: 30 day review. Continue with ITP.  HAs  Been absent because of lack of transportation. Plans to return to program.

## 2015-01-12 ENCOUNTER — Encounter: Payer: Self-pay | Admitting: *Deleted

## 2015-01-13 ENCOUNTER — Ambulatory Visit: Payer: PRIVATE HEALTH INSURANCE

## 2015-01-13 ENCOUNTER — Other Ambulatory Visit: Payer: Self-pay | Admitting: *Deleted

## 2015-01-13 DIAGNOSIS — Z951 Presence of aortocoronary bypass graft: Secondary | ICD-10-CM

## 2015-01-14 ENCOUNTER — Ambulatory Visit: Payer: PRIVATE HEALTH INSURANCE

## 2015-01-15 ENCOUNTER — Ambulatory Visit: Payer: PRIVATE HEALTH INSURANCE

## 2015-01-18 ENCOUNTER — Ambulatory Visit: Payer: PRIVATE HEALTH INSURANCE

## 2015-01-20 ENCOUNTER — Ambulatory Visit: Payer: PRIVATE HEALTH INSURANCE

## 2015-01-21 ENCOUNTER — Ambulatory Visit: Payer: PRIVATE HEALTH INSURANCE

## 2015-01-22 ENCOUNTER — Ambulatory Visit: Payer: PRIVATE HEALTH INSURANCE

## 2015-01-25 ENCOUNTER — Ambulatory Visit: Payer: PRIVATE HEALTH INSURANCE

## 2015-01-27 ENCOUNTER — Ambulatory Visit: Payer: PRIVATE HEALTH INSURANCE

## 2015-01-28 ENCOUNTER — Ambulatory Visit: Payer: PRIVATE HEALTH INSURANCE

## 2015-01-29 ENCOUNTER — Ambulatory Visit: Payer: PRIVATE HEALTH INSURANCE

## 2015-02-01 ENCOUNTER — Ambulatory Visit: Payer: PRIVATE HEALTH INSURANCE

## 2015-02-02 ENCOUNTER — Encounter: Payer: Self-pay | Admitting: *Deleted

## 2015-02-02 DIAGNOSIS — Z951 Presence of aortocoronary bypass graft: Secondary | ICD-10-CM

## 2015-02-03 ENCOUNTER — Ambulatory Visit: Payer: PRIVATE HEALTH INSURANCE

## 2015-02-04 ENCOUNTER — Ambulatory Visit: Payer: PRIVATE HEALTH INSURANCE

## 2015-02-05 ENCOUNTER — Ambulatory Visit: Payer: PRIVATE HEALTH INSURANCE

## 2015-02-08 ENCOUNTER — Ambulatory Visit: Payer: PRIVATE HEALTH INSURANCE

## 2015-02-10 ENCOUNTER — Ambulatory Visit: Payer: PRIVATE HEALTH INSURANCE

## 2015-02-10 NOTE — Addendum Note (Signed)
Addended by: Gerlene Burdock on: 02/10/2015 10:48 AM   Modules accepted: Orders

## 2015-02-10 NOTE — Progress Notes (Signed)
Cardiac Individual Treatment Plan  Patient Details  Name: Melinda [name] MRN: [id number] Date of Birth: 1956/04/05 Referring Provider:  No ref. provider found  Initial Encounter Date:    Visit Diagnosis: No diagnosis found.  Patient's Home Medications on Admission:  Current outpatient prescriptions:  .  acetaminophen (TYLENOL) 500 MG tablet, Take 1,000 mg by mouth every 8 (eight) hours as needed. , Disp: , Rfl:  .  aspirin EC 81 MG tablet, Take 1 tablet (81 mg total) by mouth daily., Disp: 90 tablet, Rfl: 3 .  atorvastatin (LIPITOR) 80 MG tablet, Take 1 tablet (80 mg total) by mouth daily at 6 PM., Disp: 30 tablet, Rfl: 1 .  clopidogrel (PLAVIX) 75 MG tablet, Take 1 tablet (75 mg total) by mouth daily., Disp: 90 tablet, Rfl: 2 .  furosemide (LASIX) 40 MG tablet, Take 40 mg by mouth daily., Disp: , Rfl:  .  gabapentin (NEURONTIN) 600 MG tablet, Take 800 mg by mouth 3 (three) times daily. , Disp: , Rfl:  .  Insulin Glargine (LANTUS) 100 UNIT/ML Solostar Pen, Inject 37 Units into the skin 2 (two) times daily. , Disp: , Rfl:  .  lisinopril (PRINIVIL,ZESTRIL) 2.5 MG tablet, Take 1 tablet (2.5 mg total) by mouth daily. (Patient taking differently: Take 2.5 mg by mouth daily. 1/2 TABLET DAILY), Disp: 30 tablet, Rfl: 6 .  metFORMIN (GLUCOPHAGE) 500 MG tablet, Take 1,000 mg by mouth 2 (two) times daily., Disp: , Rfl:  .  metoprolol tartrate (LOPRESSOR) 25 MG tablet, Take 1 tablet (25 mg total) by mouth 2 (two) times daily., Disp: 60 tablet, Rfl: 6 .  oxyCODONE (OXY IR/ROXICODONE) 5 MG immediate release tablet, Take 1-2 tablets (5-10 mg total) by mouth every 6 (six) hours as needed for severe pain. (Patient not taking: Reported on 11/17/2014), Disp: 20 tablet, Rfl: 0  Past Medical History: Past Medical History  Diagnosis Date  . HTN (hypertension)   . Tobacco abuse   . Anxiety   . Peripheral neuropathy   . Obesity   . Coronary artery disease     a. NSTEMI 06/2014; b. 4 vessel CABG 06/08/2014 LIMA  to LAD, SVG to Diagonal, SVG to OM, & SVG to PDA  . Ischemic cardiomyopathy     a. echo 06/04/14 EF 40-45%, HK of entire inferolateral and inferior myocardium c.w infarct of RCA/LCx, GR2DD, mild MR  . IDDM (insulin dependent diabetes mellitus)   . GERD (gastroesophageal reflux disease)   . OA (osteoarthritis) of knee     Tobacco Use: History  Smoking status  . Current Every Day Smoker -- 0.25 packs/day for 40 years  . Types: Cigarettes  Smokeless tobacco  . Never Used    Labs: Recent Review Flowsheet Data    Labs for ITP Cardiac and Pulmonary Rehab Latest Ref Rng 06/08/2014 06/08/2014 06/08/2014 06/08/2014 06/09/2014   PHART 7.350 - 7.450 7.333(L) 7.331(L) 7.366 - -   PCO2ART 35.0 - 45.0 mmHg 45.0 47.6(H) 48.0(H) - -   HCO3 20.0 - 24.0 mEq/L 24.1(H) 25.4(H) 27.6(H) - -   TCO2 0 - 100 mmol/L 25 27 29 23 22    ACIDBASEDEF 0.0 - 2.0 mmol/L 2.0 1.0 - - -   O2SAT - 86.0 95.0 93.0 - -       Exercise Target Goals:    Exercise Program Goal: Individual exercise prescription set with THRR, safety & activity barriers. Participant demonstrates ability to understand and report RPE using BORG scale, to self-measure pulse accurately, and to acknowledge the importance of  the exercise prescription.  Exercise Prescription Goal: Starting with aerobic activity 30 plus minutes a day, 3 days per week for initial exercise prescription. Provide home exercise prescription and guidelines that participant acknowledges understanding prior to discharge.  Activity Barriers & Risk Stratification:     Activity Barriers & Risk Stratification - 11/17/14 1545    Activity Barriers & Risk Stratification   Activity Barriers Arthritis;Joint Problems;Back Problems;Balance Concerns;History of Falls   Risk Stratification High      6 Minute Walk:     6 Minute Walk      11/17/14 1428       6 Minute Walk   Phase Initial     Distance 450 feet     Walk Time 3.75 minutes     Resting HR 99 bpm     Resting BP 118/70  mmHg     Max Ex. HR 120 bpm     Max Ex. BP 144/64 mmHg     RPE 17     Symptoms Yes (comment)     Comments Painful cramping in the calf muscles on R and L side        Initial Exercise Prescription:     Initial Exercise Prescription - 11/17/14 1400    Date of Initial Exercise Prescription   Date 11/17/14   Treadmill   MPH 0.8   Grade 0   Minutes 5  Use intervals to complete time goals   Bike   Level 0.2   Watts 10   Minutes 10   Recumbant Bike   Level 1   RPM 30   Watts 10   Minutes 10   NuStep   Level 1   Watts 15   Minutes 10   Arm Ergometer   Level 1   Watts 8   Minutes 10   Arm/Foot Ergometer   Level 4   Watts 12   Minutes 10   Cybex   Level 1   RPM 40   Minutes 10   Recumbant Elliptical   Level 1   RPM 30   Watts 10   Minutes 10   REL-XR   Level 1   Watts 30   Minutes 10   Prescription Details   Frequency (times per week) 3   Duration Progress to 30 minutes of continuous aerobic without signs/symptoms of physical distress   Intensity   THRR REST +  30   Ratings of Perceived Exertion 11-15   Progression Continue progressive overload as per policy without signs/symptoms or physical distress.   Resistance Training   Training Prescription Yes   Weight 1   Reps 10-15      Exercise Prescription Changes:     Exercise Prescription Changes      12/08/14 0600 01/11/15 1400 01/12/15 0600 02/02/15 0600     Exercise Review   Progression No  Has not started yet No  Melinda [name] has been absent the entire month. Plans to return as  No  Melinda [name] has been absent the entire month. Plans to return as  No  Absent since medical review on 11/17/14       Discharge Exercise Prescription (Final Exercise Prescription Changes):     Exercise Prescription Changes - 02/02/15 0600    Exercise Review   Progression No  Absent since medical review on 11/17/14      Nutrition:  Target Goals: Understanding of nutrition guidelines, daily intake of sodium 1500mg ,  cholesterol 200mg , calories 30% from fat and 7% or less from  saturated fats, daily to have 5 or more servings of fruits and vegetables.  Biometrics:     Pre Biometrics - 11/17/14 1425    Pre Biometrics   Height 5' 9.5" (1.765 m)   Weight 237 lb 1.6 oz (107.548 kg)   BMI (Calculated) 34.6         Post Biometrics - 11/17/14 1425     Post  Biometrics   Waist Circumference 43.5 inches   Hip Circumference 54.5 inches   Waist to Hip Ratio 0.8 %      Nutrition Therapy Plan and Nutrition Goals:   Nutrition Discharge: Rate Your Plate Scores:   Nutrition Goals Re-Evaluation:   Psychosocial: Target Goals: Acknowledge presence or absence of depression, maximize coping skills, provide positive support system. Participant is able to verbalize types and ability to use techniques and skills needed for reducing stress and depression.  Initial Review & Psychosocial Screening:     Initial Psych Review & Screening - 11/17/14 1603    Initial Review   Current issues with Current Depression   Family Dynamics   Good Support System? Yes  Patient lives alone.  Relies on her sister to drive her places.  Sister seems supportive.  Pt has church affiliation, as she attends [employer].  She states she does feel depressed.  Has Celexa for anxiety, but has not been taking it.     Barriers   Psychosocial barriers to participate in program The patient should benefit from training in stress management and relaxation.   Screening Interventions   Interventions Encouraged to exercise;Program counselor consult      Quality of Life Scores:     Quality of Life - 11/17/14 1606    Quality of Life Scores   Health/Function Pre 4.64 %   Socioeconomic Pre 12.36 %   Psych/Spiritual Pre 10.5 %   Family Pre 20 %   GLOBAL Pre 9.19 %      PHQ-9:     Recent Review Flowsheet Data    Depression screen [facility] 2/9 11/17/2014   Decreased Interest 1   Down, Depressed, Hopeless 1   PHQ - 2  Score 2   Altered sleeping 1   Tired, decreased energy 3   Change in appetite 3   Feeling bad or failure about yourself  1   Trouble concentrating 0   Moving slowly or fidgety/restless 1   Suicidal thoughts 0   PHQ-9 Score 11   Difficult doing work/chores Very difficult      Psychosocial Evaluation and Intervention:   Psychosocial Re-Evaluation:   Vocational Rehabilitation: Provide vocational rehab assistance to qualifying candidates.   Vocational Rehab Evaluation & Intervention:     Vocational Rehab - 11/17/14 1554    Initial Vocational Rehab Evaluation & Intervention   Assessment shows need for Vocational Rehabilitation No      Education: Education Goals: Education classes will be provided on a weekly basis, covering required topics. Participant will state understanding/return demonstration of topics presented.  Learning Barriers/Preferences:     Learning Barriers/Preferences - 11/17/14 1546    Learning Barriers/Preferences   Learning Barriers Sight;Exercise Concerns   Learning Preferences Video      Education Topics: General Nutrition Guidelines/Fats and Fiber: -Group instruction provided by verbal, written material, models and posters to present the general guidelines for heart healthy nutrition. Gives an explanation and review of dietary fats and fiber.   Controlling Sodium/Reading Food Labels: -Group verbal and written material supporting the discussion of sodium use in  heart healthy nutrition. Review and explanation with models, verbal and written materials for utilization of the food label.   Exercise Physiology & Risk Factors: - Group verbal and written instruction with models to review the exercise physiology of the cardiovascular system and associated critical values. Details cardiovascular disease risk factors and the goals associated with each risk factor.   Aerobic Exercise & Resistance Training: - Gives group verbal and written discussion on  the health impact of inactivity. On the components of aerobic and resistive training programs and the benefits of this training and how to safely progress through these programs.   Flexibility, Balance, General Exercise Guidelines: - Provides group verbal and written instruction on the benefits of flexibility and balance training programs. Provides general exercise guidelines with specific guidelines to those with heart or lung disease. Demonstration and skill practice provided.   Stress Management: - Provides group verbal and written instruction about the health risks of elevated stress, cause of high stress, and healthy ways to reduce stress.   Depression: - Provides group verbal and written instruction on the correlation between heart/lung disease and depressed mood, treatment options, and the stigmas associated with seeking treatment.   Anatomy & Physiology of the Heart: - Group verbal and written instruction and models provide basic cardiac anatomy and physiology, with the coronary electrical and arterial systems. Review of: AMI, Angina, Valve disease, Heart Failure, Cardiac Arrhythmia, Pacemakers, and the ICD.   Cardiac Procedures: - Group verbal and written instruction and models to describe the testing methods done to diagnose heart disease. Reviews the outcomes of the test results. Describes the treatment choices: Medical Management, Angioplasty, or Coronary Bypass Surgery.   Cardiac Medications: - Group verbal and written instruction to review commonly prescribed medications for heart disease. Reviews the medication, class of the drug, and side effects. Includes the steps to properly store meds and maintain the prescription regimen.   Go Sex-Intimacy & Heart Disease, Get SMART - Goal Setting: - Group verbal and written instruction through game format to discuss heart disease and the return to sexual intimacy. Provides group verbal and written material to discuss and apply goal  setting through the application of the S.M.A.R.T. Method.   Other Matters of the Heart: - Provides group verbal, written materials and models to describe Heart Failure, Angina, Valve Disease, and Diabetes in the realm of heart disease. Includes description of the disease process and treatment options available to the cardiac patient.   Exercise & Equipment Safety: - Individual verbal instruction and demonstration of equipment use and safety with use of the equipment.          Cardiac Rehab from 11/17/2014 in [facility] Cardiac Rehab   Date  11/17/14   Educator  DW   Instruction Review Code  1- partially meets, needs review/practice      Infection Prevention: - Provides verbal and written material to individual with discussion of infection control including proper hand washing and proper equipment cleaning during exercise session.      Cardiac Rehab from 11/17/2014 in [facility] Cardiac Rehab   Date  11/17/14   Educator  DW   Instruction Review Code  2- meets goals/outcomes      Falls Prevention: - Provides verbal and written material to individual with discussion of falls prevention and safety.      Cardiac Rehab from 11/17/2014 in [facility] Cardiac Rehab   Date  11/17/14   Educator  DW   Instruction Review Code  2- meets goals/outcomes  Diabetes: - Individual verbal and written instruction to review signs/symptoms of diabetes, desired ranges of glucose level fasting, after meals and with exercise. Advice that pre and post exercise glucose checks will be done for 3 sessions at entry of program.      Cardiac Rehab from 11/17/2014 in [facility] Cardiac Rehab   Date  11/17/14   Educator  DW   Instruction Review Code  2- meets goals/outcomes       Knowledge Questionnaire Score:     Knowledge Questionnaire Score - 11/17/14 1550    Knowledge Questionnaire Score   Pre Score  16/28      Personal Goals and Risk Factors at Admission:     Personal Goals and Risk Factors at Admission -  11/17/14 1600    Personal Goals and Risk Factors on Admission    Weight Management Yes   Admit Weight 237 lb 1.6 oz (107.548 kg)   Goal Weight 180 lb (81.647 kg)   Increase Aerobic Exercise and Physical Activity Yes   Intervention While in program, learn and follow the exercise prescription taught. Start at a low level workload and increase workload after able to maintain previous level for 30 minutes. Increase time before increasing intensity.   Quit Smoking Yes   Number of packs per day 0.25 pack per day currently.     Intervention Utilize your health care professional team to help with smoking cessation while in the program. Your doctor can prescribe medications to aid in cessation. The program can provide information and counseling as needed.   Take Less Medication Yes   Intervention Learn your risk factors and begin the lifestyle modifications for risk factor control during your time in the program.   Understand more about Heart/Pulmonary Disease. Yes   Intervention While in program utilize professionals for any questions, and attend the education sessions. Great websites to use are www.americanheart.org or www.lung.org for reliable information.   Diabetes Yes   Goal Blood glucose control identified by blood glucose values, HgbA1C. Participant verbalizes understanding of the signs/symptoms of hyper/hypo glycemia, proper foot care and importance of medication and nutrition plan for blood glucose control.   Intervention Provide nutrition & aerobic exercise along with prescribed medications to achieve blood glucose in normal ranges: Fasting 65-99 mg/dL   Hypertension Yes   Goal Participant will see blood pressure controlled within the values of 140/40mm/Hg or within value directed by their physician.   Intervention Provide nutrition & aerobic exercise along with prescribed medications to achieve BP 140/90 or less.   Lipids Yes   Goal Cholesterol controlled with medications as prescribed, with  individualized exercise RX and with personalized nutrition plan. Value goals: LDL < 70mg , HDL > 40mg . Participant states understanding of desired cholesterol values and following prescriptions.   Intervention Provide nutrition & aerobic exercise along with prescribed medications to achieve LDL 70mg , HDL >40mg .   Stress Yes   Goal To meet with psychosocial counselor for stress and relaxation information and guidance. To state understanding of performing relaxation techniques and or identifying personal stressors.   Intervention Provide education on types of stress, identifiying stressors, and ways to cope with stress. Provide demonstration and active practice of relaxation techniques.      Personal Goals and Risk Factors Review:      Goals and Risk Factor Review      01/11/15 1431           Other Goal   Goals Progress/Improvement seen  No  Comments Melinda [name] has been out over 2 months waiting for transportation to program. She is applying and is having conflict with setting up the transportation          Personal Goals Discharge (Final Personal Goals and Risk Factors Review):      Goals and Risk Factor Review - 01/11/15 1431    Other Goal   Goals Progress/Improvement seen  No   Comments Melinda [name] has been out over 2 months waiting for transportation to program. She is applying and is having conflict with setting up the transportation       Comments: Melinda [name] has been out lately with medical issues.

## 2015-02-11 ENCOUNTER — Ambulatory Visit: Payer: PRIVATE HEALTH INSURANCE

## 2015-02-12 ENCOUNTER — Ambulatory Visit: Payer: PRIVATE HEALTH INSURANCE

## 2015-02-15 ENCOUNTER — Ambulatory Visit: Payer: PRIVATE HEALTH INSURANCE

## 2015-02-17 ENCOUNTER — Ambulatory Visit: Payer: PRIVATE HEALTH INSURANCE

## 2015-03-02 ENCOUNTER — Encounter: Payer: Self-pay | Admitting: *Deleted

## 2015-03-08 ENCOUNTER — Telehealth: Payer: Self-pay | Admitting: *Deleted

## 2015-03-08 ENCOUNTER — Encounter: Payer: Self-pay | Admitting: *Deleted

## 2015-03-08 DIAGNOSIS — Z951 Presence of aortocoronary bypass graft: Secondary | ICD-10-CM

## 2015-03-08 NOTE — Progress Notes (Signed)
Cardiac Individual Treatment Plan  Patient Details  Name: Melinda [name] MRN: [id number] Date of Birth: 1956/04/14 Referring Provider:  Minna Merritts, MD  Initial Encounter Date:    Visit Diagnosis: S/P CABG x 4  Patient's Home Medications on Admission:  Current outpatient prescriptions:  .  acetaminophen (TYLENOL) 500 MG tablet, Take 1,000 mg by mouth every 8 (eight) hours as needed. , Disp: , Rfl:  .  aspirin EC 81 MG tablet, Take 1 tablet (81 mg total) by mouth daily., Disp: 90 tablet, Rfl: 3 .  atorvastatin (LIPITOR) 80 MG tablet, Take 1 tablet (80 mg total) by mouth daily at 6 PM., Disp: 30 tablet, Rfl: 1 .  clopidogrel (PLAVIX) 75 MG tablet, Take 1 tablet (75 mg total) by mouth daily., Disp: 90 tablet, Rfl: 2 .  furosemide (LASIX) 40 MG tablet, Take 40 mg by mouth daily., Disp: , Rfl:  .  gabapentin (NEURONTIN) 600 MG tablet, Take 800 mg by mouth 3 (three) times daily. , Disp: , Rfl:  .  Insulin Glargine (LANTUS) 100 UNIT/ML Solostar Pen, Inject 37 Units into the skin 2 (two) times daily. , Disp: , Rfl:  .  lisinopril (PRINIVIL,ZESTRIL) 2.5 MG tablet, Take 1 tablet (2.5 mg total) by mouth daily. (Patient taking differently: Take 2.5 mg by mouth daily. 1/2 TABLET DAILY), Disp: 30 tablet, Rfl: 6 .  metFORMIN (GLUCOPHAGE) 500 MG tablet, Take 1,000 mg by mouth 2 (two) times daily., Disp: , Rfl:  .  metoprolol tartrate (LOPRESSOR) 25 MG tablet, Take 1 tablet (25 mg total) by mouth 2 (two) times daily., Disp: 60 tablet, Rfl: 6 .  oxyCODONE (OXY IR/ROXICODONE) 5 MG immediate release tablet, Take 1-2 tablets (5-10 mg total) by mouth every 6 (six) hours as needed for severe pain. (Patient not taking: Reported on 11/17/2014), Disp: 20 tablet, Rfl: 0  Past Medical History: Past Medical History  Diagnosis Date  . HTN (hypertension)   . Tobacco abuse   . Anxiety   . Peripheral neuropathy   . Obesity   . Coronary artery disease     a. NSTEMI 06/2014; b. 4 vessel CABG 06/08/2014 LIMA to LAD,  SVG to Diagonal, SVG to OM, & SVG to PDA  . Ischemic cardiomyopathy     a. echo 06/04/14 EF 40-45%, HK of entire inferolateral and inferior myocardium c.w infarct of RCA/LCx, GR2DD, mild MR  . IDDM (insulin dependent diabetes mellitus)   . GERD (gastroesophageal reflux disease)   . OA (osteoarthritis) of knee     Tobacco Use: History  Smoking status  . Current Every Day Smoker -- 0.25 packs/day for 40 years  . Types: Cigarettes  Smokeless tobacco  . Never Used    Labs: Recent Review Flowsheet Data    Labs for ITP Cardiac and Pulmonary Rehab Latest Ref Rng 06/08/2014 06/08/2014 06/08/2014 06/08/2014 06/09/2014   PHART 7.350 - 7.450 7.333(L) 7.331(L) 7.366 - -   PCO2ART 35.0 - 45.0 mmHg 45.0 47.6(H) 48.0(H) - -   HCO3 20.0 - 24.0 mEq/L 24.1(H) 25.4(H) 27.6(H) - -   TCO2 0 - 100 mmol/L 25 27 29 23 22    ACIDBASEDEF 0.0 - 2.0 mmol/L 2.0 1.0 - - -   O2SAT - 86.0 95.0 93.0 - -       Exercise Target Goals:    Exercise Program Goal: Individual exercise prescription set with THRR, safety & activity barriers. Participant demonstrates ability to understand and report RPE using BORG scale, to self-measure pulse accurately, and to acknowledge the importance  of the exercise prescription.  Exercise Prescription Goal: Starting with aerobic activity 30 plus minutes a day, 3 days per week for initial exercise prescription. Provide home exercise prescription and guidelines that participant acknowledges understanding prior to discharge.  Activity Barriers & Risk Stratification:     Activity Barriers & Risk Stratification - 11/17/14 1545    Activity Barriers & Risk Stratification   Activity Barriers Arthritis;Joint Problems;Back Problems;Balance Concerns;History of Falls   Risk Stratification High      6 Minute Walk:     6 Minute Walk      11/17/14 1428       6 Minute Walk   Phase Initial     Distance 450 feet     Walk Time 3.75 minutes     Resting HR 99 bpm     Resting BP 118/70 mmHg      Max Ex. HR 120 bpm     Max Ex. BP 144/64 mmHg     RPE 17     Symptoms Yes (comment)     Comments Painful cramping in the calf muscles on R and L side        Initial Exercise Prescription:     Initial Exercise Prescription - 11/17/14 1400    Date of Initial Exercise Prescription   Date 11/17/14   Treadmill   MPH 0.8   Grade 0   Minutes 5  Use intervals to complete time goals   Bike   Level 0.2   Watts 10   Minutes 10   Recumbant Bike   Level 1   RPM 30   Watts 10   Minutes 10   NuStep   Level 1   Watts 15   Minutes 10   Arm Ergometer   Level 1   Watts 8   Minutes 10   Arm/Foot Ergometer   Level 4   Watts 12   Minutes 10   Cybex   Level 1   RPM 40   Minutes 10   Recumbant Elliptical   Level 1   RPM 30   Watts 10   Minutes 10   REL-XR   Level 1   Watts 30   Minutes 10   Prescription Details   Frequency (times per week) 3   Duration Progress to 30 minutes of continuous aerobic without signs/symptoms of physical distress   Intensity   THRR REST +  30   Ratings of Perceived Exertion 11-15   Progression Continue progressive overload as per policy without signs/symptoms or physical distress.   Resistance Training   Training Prescription Yes   Weight 1   Reps 10-15      Exercise Prescription Changes:     Exercise Prescription Changes      12/08/14 0600 01/11/15 1400 01/12/15 0600 02/02/15 0600 03/02/15 0700   Exercise Review   Progression No  Has not started yet No  Melinda [name] has been absent the entire month. Plans to return as  No  Melinda [name] has been absent the entire month. Plans to return as  No  Absent since medical review on 11/17/14 No  Absent since medical review on 11/17/14      Discharge Exercise Prescription (Final Exercise Prescription Changes):     Exercise Prescription Changes - 03/02/15 0700    Exercise Review   Progression No  Absent since medical review on 11/17/14      Nutrition:  Target Goals: Understanding of nutrition  guidelines, daily intake of sodium 1500mg , cholesterol 200mg , calories  30% from fat and 7% or less from saturated fats, daily to have 5 or more servings of fruits and vegetables.  Biometrics:     Pre Biometrics - 11/17/14 1425    Pre Biometrics   Height 5' 9.5" (1.765 m)   Weight 237 lb 1.6 oz (107.548 kg)   BMI (Calculated) 34.6         Post Biometrics - 11/17/14 1425     Post  Biometrics   Waist Circumference 43.5 inches   Hip Circumference 54.5 inches   Waist to Hip Ratio 0.8 %      Nutrition Therapy Plan and Nutrition Goals:   Nutrition Discharge: Rate Your Plate Scores:   Nutrition Goals Re-Evaluation:   Psychosocial: Target Goals: Acknowledge presence or absence of depression, maximize coping skills, provide positive support system. Participant is able to verbalize types and ability to use techniques and skills needed for reducing stress and depression.  Initial Review & Psychosocial Screening:     Initial Psych Review & Screening - 11/17/14 1603    Initial Review   Current issues with Current Depression   Family Dynamics   Good Support System? Yes  Patient lives alone.  Relies on her sister to drive her places.  Sister seems supportive.  Pt has church affiliation, as she attends [name]  She states she does feel depressed.  Has Celexa for anxiety, but has not been taking it.     Barriers   Psychosocial barriers to participate in program The patient should benefit from training in stress management and relaxation.   Screening Interventions   Interventions Encouraged to exercise;Program counselor consult      Quality of Life Scores:     Quality of Life - 11/17/14 1606    Quality of Life Scores   Health/Function Pre 4.64 %   Socioeconomic Pre 12.36 %   Psych/Spiritual Pre 10.5 %   Family Pre 20 %   GLOBAL Pre 9.19 %      PHQ-9:     Recent Review Flowsheet Data    Depression screen [facility] 2/9 11/17/2014   Decreased Interest 1    Down, Depressed, Hopeless 1   PHQ - 2 Score 2   Altered sleeping 1   Tired, decreased energy 3   Change in appetite 3   Feeling bad or failure about yourself  1   Trouble concentrating 0   Moving slowly or fidgety/restless 1   Suicidal thoughts 0   PHQ-9 Score 11   Difficult doing work/chores Very difficult      Psychosocial Evaluation and Intervention:   Psychosocial Re-Evaluation:   Vocational Rehabilitation: Provide vocational rehab assistance to qualifying candidates.   Vocational Rehab Evaluation & Intervention:     Vocational Rehab - 11/17/14 1554    Initial Vocational Rehab Evaluation & Intervention   Assessment shows need for Vocational Rehabilitation No      Education: Education Goals: Education classes will be provided on a weekly basis, covering required topics. Participant will state understanding/return demonstration of topics presented.  Learning Barriers/Preferences:     Learning Barriers/Preferences - 11/17/14 1546    Learning Barriers/Preferences   Learning Barriers Sight;Exercise Concerns   Learning Preferences Video      Education Topics: General Nutrition Guidelines/Fats and Fiber: -Group instruction provided by verbal, written material, models and posters to present the general guidelines for heart healthy nutrition. Gives an explanation and review of dietary fats and fiber.   Controlling Sodium/Reading Food Labels: -Group verbal and written  material supporting the discussion of sodium use in heart healthy nutrition. Review and explanation with models, verbal and written materials for utilization of the food label.   Exercise Physiology & Risk Factors: - Group verbal and written instruction with models to review the exercise physiology of the cardiovascular system and associated critical values. Details cardiovascular disease risk factors and the goals associated with each risk factor.   Aerobic Exercise & Resistance Training: - Gives  group verbal and written discussion on the health impact of inactivity. On the components of aerobic and resistive training programs and the benefits of this training and how to safely progress through these programs.   Flexibility, Balance, General Exercise Guidelines: - Provides group verbal and written instruction on the benefits of flexibility and balance training programs. Provides general exercise guidelines with specific guidelines to those with heart or lung disease. Demonstration and skill practice provided.   Stress Management: - Provides group verbal and written instruction about the health risks of elevated stress, cause of high stress, and healthy ways to reduce stress.   Depression: - Provides group verbal and written instruction on the correlation between heart/lung disease and depressed mood, treatment options, and the stigmas associated with seeking treatment.   Anatomy & Physiology of the Heart: - Group verbal and written instruction and models provide basic cardiac anatomy and physiology, with the coronary electrical and arterial systems. Review of: AMI, Angina, Valve disease, Heart Failure, Cardiac Arrhythmia, Pacemakers, and the ICD.   Cardiac Procedures: - Group verbal and written instruction and models to describe the testing methods done to diagnose heart disease. Reviews the outcomes of the test results. Describes the treatment choices: Medical Management, Angioplasty, or Coronary Bypass Surgery.   Cardiac Medications: - Group verbal and written instruction to review commonly prescribed medications for heart disease. Reviews the medication, class of the drug, and side effects. Includes the steps to properly store meds and maintain the prescription regimen.   Go Sex-Intimacy & Heart Disease, Get SMART - Goal Setting: - Group verbal and written instruction through game format to discuss heart disease and the return to sexual intimacy. Provides group verbal and  written material to discuss and apply goal setting through the application of the S.M.A.R.T. Method.   Other Matters of the Heart: - Provides group verbal, written materials and models to describe Heart Failure, Angina, Valve Disease, and Diabetes in the realm of heart disease. Includes description of the disease process and treatment options available to the cardiac patient.   Exercise & Equipment Safety: - Individual verbal instruction and demonstration of equipment use and safety with use of the equipment.          Cardiac Rehab from 11/17/2014 in [facility] Cardiac Rehab   Date  11/17/14   Educator  DW   Instruction Review Code  1- partially meets, needs review/practice      Infection Prevention: - Provides verbal and written material to individual with discussion of infection control including proper hand washing and proper equipment cleaning during exercise session.      Cardiac Rehab from 11/17/2014 in [facility] Cardiac Rehab   Date  11/17/14   Educator  DW   Instruction Review Code  2- meets goals/outcomes      Falls Prevention: - Provides verbal and written material to individual with discussion of falls prevention and safety.      Cardiac Rehab from 11/17/2014 in [facility] Cardiac Rehab   Date  11/17/14   Educator  DW   Instruction Review Code  2- meets goals/outcomes      Diabetes: - Individual verbal and written instruction to review signs/symptoms of diabetes, desired ranges of glucose level fasting, after meals and with exercise. Advice that pre and post exercise glucose checks will be done for 3 sessions at entry of program.      Cardiac Rehab from 11/17/2014 in [facility] Cardiac Rehab   Date  11/17/14   Educator  DW   Instruction Review Code  2- meets goals/outcomes       Knowledge Questionnaire Score:     Knowledge Questionnaire Score - 11/17/14 1550    Knowledge Questionnaire Score   Pre Score  16/28      Personal Goals and Risk Factors at Admission:     Personal  Goals and Risk Factors at Admission - 11/17/14 1600    Personal Goals and Risk Factors on Admission    Weight Management Yes   Admit Weight 237 lb 1.6 oz (107.548 kg)   Goal Weight 180 lb (81.647 kg)   Increase Aerobic Exercise and Physical Activity Yes   Intervention While in program, learn and follow the exercise prescription taught. Start at a low level workload and increase workload after able to maintain previous level for 30 minutes. Increase time before increasing intensity.   Quit Smoking Yes   Number of packs per day 0.25 pack per day currently.     Intervention Utilize your health care professional team to help with smoking cessation while in the program. Your doctor can prescribe medications to aid in cessation. The program can provide information and counseling as needed.   Take Less Medication Yes   Intervention Learn your risk factors and begin the lifestyle modifications for risk factor control during your time in the program.   Understand more about Heart/Pulmonary Disease. Yes   Intervention While in program utilize professionals for any questions, and attend the education sessions. Great websites to use are www.americanheart.org or www.lung.org for reliable information.   Diabetes Yes   Goal Blood glucose control identified by blood glucose values, HgbA1C. Participant verbalizes understanding of the signs/symptoms of hyper/hypo glycemia, proper foot care and importance of medication and nutrition plan for blood glucose control.   Intervention Provide nutrition & aerobic exercise along with prescribed medications to achieve blood glucose in normal ranges: Fasting 65-99 mg/dL   Hypertension Yes   Goal Participant will see blood pressure controlled within the values of 140/47mm/Hg or within value directed by their physician.   Intervention Provide nutrition & aerobic exercise along with prescribed medications to achieve BP 140/90 or less.   Lipids Yes   Goal Cholesterol controlled  with medications as prescribed, with individualized exercise RX and with personalized nutrition plan. Value goals: LDL < 70mg , HDL > 40mg . Participant states understanding of desired cholesterol values and following prescriptions.   Intervention Provide nutrition & aerobic exercise along with prescribed medications to achieve LDL 70mg , HDL >40mg .   Stress Yes   Goal To meet with psychosocial counselor for stress and relaxation information and guidance. To state understanding of performing relaxation techniques and or identifying personal stressors.   Intervention Provide education on types of stress, identifiying stressors, and ways to cope with stress. Provide demonstration and active practice of relaxation techniques.      Personal Goals and Risk Factors Review:      Goals and Risk Factor Review      01/11/15 1431           Other Goal   Goals Progress/Improvement seen  No       Comments Danessa has been out over 2 months waiting for transportation to program. She is applying and is having conflict with setting up the transportation          Personal Goals Discharge (Final Personal Goals and Risk Factors Review):      Goals and Risk Factor Review - 01/11/15 1431    Other Goal   Goals Progress/Improvement seen  No   Comments Melinda [name] has been out over 2 months waiting for transportation to program. She is applying and is having conflict with setting up the transportation       Comments: 30 day review  Melinda [name] has been absent since orientation. She wants to return to the program. She has signed up for the Tues/Thurs class and will return 03/18/2015.

## 2015-03-08 NOTE — Telephone Encounter (Signed)
Spoke with Melinda [name] today.      She wants to do the program.  Has reassigned her appointment to Tues/Thurs class and will start 11/17.

## 2015-03-10 ENCOUNTER — Other Ambulatory Visit: Payer: Self-pay | Admitting: *Deleted

## 2015-03-10 DIAGNOSIS — Z951 Presence of aortocoronary bypass graft: Secondary | ICD-10-CM

## 2015-03-22 ENCOUNTER — Ambulatory Visit: Payer: PRIVATE HEALTH INSURANCE | Admitting: Cardiovascular Disease

## 2015-03-22 ENCOUNTER — Encounter: Payer: Self-pay | Admitting: *Deleted

## 2015-03-22 DIAGNOSIS — R0989 Other specified symptoms and signs involving the circulatory and respiratory systems: Secondary | ICD-10-CM

## 2015-03-30 ENCOUNTER — Ambulatory Visit: Payer: Medicaid Other

## 2015-03-31 ENCOUNTER — Telehealth: Payer: Self-pay | Admitting: *Deleted

## 2015-03-31 NOTE — Telephone Encounter (Signed)
Spoke with Melinda [name] today. She has not come to class since orientation in July.  She wants to come on Monday. [place] be here for the 3:45 Class.

## 2015-04-01 ENCOUNTER — Ambulatory Visit: Payer: Medicaid Other

## 2015-04-05 ENCOUNTER — Ambulatory Visit: Payer: Medicaid Other

## 2015-04-06 ENCOUNTER — Ambulatory Visit: Payer: Medicaid Other

## 2015-04-07 ENCOUNTER — Ambulatory Visit: Payer: Medicaid Other

## 2015-04-07 NOTE — Addendum Note (Signed)
Addended by: Lynford Humphrey on: 04/07/2015 11:33 AM   Modules accepted: Orders

## 2015-04-07 NOTE — Progress Notes (Signed)
Cardiac Individual Treatment Plan  Patient Details  Name: Melinda [name] MRN: [id number] Date of Birth: April 03, 1956 Referring Provider:  Minna Merritts, MD  Initial Encounter Date: Date: 11/17/14  Visit Diagnosis: S/P CABG x 4 - Plan: CARDIAC REHAB 30 DAY REVIEW  Patient's Home Medications on Admission:  Current outpatient prescriptions:  .  acetaminophen (TYLENOL) 500 MG tablet, Take 1,000 mg by mouth every 8 (eight) hours as needed. , Disp: , Rfl:  .  aspirin EC 81 MG tablet, Take 1 tablet (81 mg total) by mouth daily., Disp: 90 tablet, Rfl: 3 .  atorvastatin (LIPITOR) 80 MG tablet, Take 1 tablet (80 mg total) by mouth daily at 6 PM., Disp: 30 tablet, Rfl: 1 .  clopidogrel (PLAVIX) 75 MG tablet, Take 1 tablet (75 mg total) by mouth daily., Disp: 90 tablet, Rfl: 2 .  furosemide (LASIX) 40 MG tablet, Take 40 mg by mouth daily., Disp: , Rfl:  .  gabapentin (NEURONTIN) 600 MG tablet, Take 800 mg by mouth 3 (three) times daily. , Disp: , Rfl:  .  Insulin Glargine (LANTUS) 100 UNIT/ML Solostar Pen, Inject 37 Units into the skin 2 (two) times daily. , Disp: , Rfl:  .  lisinopril (PRINIVIL,ZESTRIL) 2.5 MG tablet, Take 1 tablet (2.5 mg total) by mouth daily. (Patient taking differently: Take 2.5 mg by mouth daily. 1/2 TABLET DAILY), Disp: 30 tablet, Rfl: 6 .  metFORMIN (GLUCOPHAGE) 500 MG tablet, Take 1,000 mg by mouth 2 (two) times daily., Disp: , Rfl:  .  metoprolol tartrate (LOPRESSOR) 25 MG tablet, Take 1 tablet (25 mg total) by mouth 2 (two) times daily., Disp: 60 tablet, Rfl: 6 .  oxyCODONE (OXY IR/ROXICODONE) 5 MG immediate release tablet, Take 1-2 tablets (5-10 mg total) by mouth every 6 (six) hours as needed for severe pain. (Patient not taking: Reported on 11/17/2014), Disp: 20 tablet, Rfl: 0  Past Medical History: Past Medical History  Diagnosis Date  . HTN (hypertension)   . Tobacco abuse   . Anxiety   . Peripheral neuropathy   . Obesity   . Coronary artery disease     a.  NSTEMI 06/2014; b. 4 vessel CABG 06/08/2014 LIMA to LAD, SVG to Diagonal, SVG to OM, & SVG to PDA  . Ischemic cardiomyopathy     a. echo 06/04/14 EF 40-45%, HK of entire inferolateral and inferior myocardium c.w infarct of RCA/LCx, GR2DD, mild MR  . IDDM (insulin dependent diabetes mellitus)   . GERD (gastroesophageal reflux disease)   . OA (osteoarthritis) of knee     Tobacco Use: History  Smoking status  . Current Every Day Smoker -- 0.25 packs/day for 40 years  . Types: Cigarettes  Smokeless tobacco  . Never Used    Labs: Recent Review Flowsheet Data    Labs for ITP Cardiac and Pulmonary Rehab Latest Ref Rng 06/08/2014 06/08/2014 06/08/2014 06/08/2014 06/09/2014   PHART 7.350 - 7.450 7.333(L) 7.331(L) 7.366 - -   PCO2ART 35.0 - 45.0 mmHg 45.0 47.6(H) 48.0(H) - -   HCO3 20.0 - 24.0 mEq/L 24.1(H) 25.4(H) 27.6(H) - -   TCO2 0 - 100 mmol/L 25 27 29 23 22    ACIDBASEDEF 0.0 - 2.0 mmol/L 2.0 1.0 - - -   O2SAT - 86.0 95.0 93.0 - -       Exercise Target Goals: Date: 11/17/14  Exercise Program Goal: Individual exercise prescription set with THRR, safety & activity barriers. Participant demonstrates ability to understand and report RPE using BORG scale, to self-measure  pulse accurately, and to acknowledge the importance of the exercise prescription.  Exercise Prescription Goal: Starting with aerobic activity 30 plus minutes a day, 3 days per week for initial exercise prescription. Provide home exercise prescription and guidelines that participant acknowledges understanding prior to discharge.  Activity Barriers & Risk Stratification:     Activity Barriers & Risk Stratification - 11/17/14 1545    Activity Barriers & Risk Stratification   Activity Barriers Arthritis;Joint Problems;Back Problems;Balance Concerns;History of Falls   Risk Stratification High      6 Minute Walk:     6 Minute Walk      11/17/14 1428       6 Minute Walk   Phase Initial     Distance 450 feet     Walk Time  3.75 minutes     Resting HR 99 bpm     Resting BP 118/70 mmHg     Max Ex. HR 120 bpm     Max Ex. BP 144/64 mmHg     RPE 17     Symptoms Yes (comment)     Comments Painful cramping in the calf muscles on R and L side        Initial Exercise Prescription:     Initial Exercise Prescription - 11/17/14 1400    Date of Initial Exercise Prescription   Date 11/17/14   Treadmill   MPH 0.8   Grade 0   Minutes 5  Use intervals to complete time goals   Bike   Level 0.2   Watts 10   Minutes 10   Recumbant Bike   Level 1   RPM 30   Watts 10   Minutes 10   NuStep   Level 1   Watts 15   Minutes 10   Arm Ergometer   Level 1   Watts 8   Minutes 10   Arm/Foot Ergometer   Level 4   Watts 12   Minutes 10   Cybex   Level 1   RPM 40   Minutes 10   Recumbant Elliptical   Level 1   RPM 30   Watts 10   Minutes 10   REL-XR   Level 1   Watts 30   Minutes 10   Prescription Details   Frequency (times per week) 3   Duration Progress to 30 minutes of continuous aerobic without signs/symptoms of physical distress   Intensity   THRR REST +  30   Ratings of Perceived Exertion 11-15   Progression Continue progressive overload as per policy without signs/symptoms or physical distress.   Resistance Training   Training Prescription Yes   Weight 1   Reps 10-15      Exercise Prescription Changes:     Exercise Prescription Changes      12/08/14 0600 01/11/15 1400 01/12/15 0600 02/02/15 0600 03/02/15 0700   Exercise Review   Progression No  Has not started yet No  [name] has been absent the entire month. Plans to return as  No  Vindhya has been absent the entire month. Plans to return as  No  Absent since medical review on 11/17/14 No  Absent since medical review on 11/17/14      Discharge Exercise Prescription (Final Exercise Prescription Changes):     Exercise Prescription Changes - 03/02/15 0700    Exercise Review   Progression No  Absent since medical review on 11/17/14       Nutrition:  Target Goals: Understanding of nutrition guidelines, daily  intake of sodium 1500mg , cholesterol 200mg , calories 30% from fat and 7% or less from saturated fats, daily to have 5 or more servings of fruits and vegetables.  Biometrics:     Pre Biometrics - 11/17/14 1425    Pre Biometrics   Height 5' 9.5" (1.765 m)   Weight 237 lb 1.6 oz (107.548 kg)   BMI (Calculated) 34.6         Post Biometrics - 11/17/14 1425     Post  Biometrics   Waist Circumference 43.5 inches   Hip Circumference 54.5 inches   Waist to Hip Ratio 0.8 %      Nutrition Therapy Plan and Nutrition Goals:   Nutrition Discharge: Rate Your Plate Scores:   Nutrition Goals Re-Evaluation:   Psychosocial: Target Goals: Acknowledge presence or absence of depression, maximize coping skills, provide positive support system. Participant is able to verbalize types and ability to use techniques and skills needed for reducing stress and depression.  Initial Review & Psychosocial Screening:     Initial Psych Review & Screening - 11/17/14 1603    Initial Review   Current issues with Current Depression   Family Dynamics   Good Support System? Yes  Patient lives alone.  Relies on her sister to drive her places.  Sister seems supportive.  Pt has church affiliation, as she attends [employer].  She states she does feel depressed.  Has Celexa for anxiety, but has not been taking it.     Barriers   Psychosocial barriers to participate in program The patient should benefit from training in stress management and relaxation.   Screening Interventions   Interventions Encouraged to exercise;Program counselor consult      Quality of Life Scores:     Quality of Life - 11/17/14 1606    Quality of Life Scores   Health/Function Pre 4.64 %   Socioeconomic Pre 12.36 %   Psych/Spiritual Pre 10.5 %   Family Pre 20 %   GLOBAL Pre 9.19 %      PHQ-9:     Recent Review Flowsheet Data     Depression screen [facility] 2/9 11/17/2014   Decreased Interest 1   Down, Depressed, Hopeless 1   PHQ - 2 Score 2   Altered sleeping 1   Tired, decreased energy 3   Change in appetite 3   Feeling bad or failure about yourself  1   Trouble concentrating 0   Moving slowly or fidgety/restless 1   Suicidal thoughts 0   PHQ-9 Score 11   Difficult doing work/chores Very difficult      Psychosocial Evaluation and Intervention:   Psychosocial Re-Evaluation:   Vocational Rehabilitation: Provide vocational rehab assistance to qualifying candidates.   Vocational Rehab Evaluation & Intervention:     Vocational Rehab - 11/17/14 1554    Initial Vocational Rehab Evaluation & Intervention   Assessment shows need for Vocational Rehabilitation No      Education: Education Goals: Education classes will be provided on a weekly basis, covering required topics. Participant will state understanding/return demonstration of topics presented.  Learning Barriers/Preferences:     Learning Barriers/Preferences - 11/17/14 1546    Learning Barriers/Preferences   Learning Barriers Sight;Exercise Concerns   Learning Preferences Video      Education Topics: General Nutrition Guidelines/Fats and Fiber: -Group instruction provided by verbal, written material, models and posters to present the general guidelines for heart healthy nutrition. Gives an explanation and review of dietary fats and fiber.   Controlling  Sodium/Reading Food Labels: -Group verbal and written material supporting the discussion of sodium use in heart healthy nutrition. Review and explanation with models, verbal and written materials for utilization of the food label.   Exercise Physiology & Risk Factors: - Group verbal and written instruction with models to review the exercise physiology of the cardiovascular system and associated critical values. Details cardiovascular disease risk factors and the goals associated with each  risk factor.   Aerobic Exercise & Resistance Training: - Gives group verbal and written discussion on the health impact of inactivity. On the components of aerobic and resistive training programs and the benefits of this training and how to safely progress through these programs.   Flexibility, Balance, General Exercise Guidelines: - Provides group verbal and written instruction on the benefits of flexibility and balance training programs. Provides general exercise guidelines with specific guidelines to those with heart or lung disease. Demonstration and skill practice provided.   Stress Management: - Provides group verbal and written instruction about the health risks of elevated stress, cause of high stress, and healthy ways to reduce stress.   Depression: - Provides group verbal and written instruction on the correlation between heart/lung disease and depressed mood, treatment options, and the stigmas associated with seeking treatment.   Anatomy & Physiology of the Heart: - Group verbal and written instruction and models provide basic cardiac anatomy and physiology, with the coronary electrical and arterial systems. Review of: AMI, Angina, Valve disease, Heart Failure, Cardiac Arrhythmia, Pacemakers, and the ICD.   Cardiac Procedures: - Group verbal and written instruction and models to describe the testing methods done to diagnose heart disease. Reviews the outcomes of the test results. Describes the treatment choices: Medical Management, Angioplasty, or Coronary Bypass Surgery.   Cardiac Medications: - Group verbal and written instruction to review commonly prescribed medications for heart disease. Reviews the medication, class of the drug, and side effects. Includes the steps to properly store meds and maintain the prescription regimen.   Go Sex-Intimacy & Heart Disease, Get SMART - Goal Setting: - Group verbal and written instruction through game format to discuss heart disease  and the return to sexual intimacy. Provides group verbal and written material to discuss and apply goal setting through the application of the S.M.A.R.T. Method.   Other Matters of the Heart: - Provides group verbal, written materials and models to describe Heart Failure, Angina, Valve Disease, and Diabetes in the realm of heart disease. Includes description of the disease process and treatment options available to the cardiac patient.   Exercise & Equipment Safety: - Individual verbal instruction and demonstration of equipment use and safety with use of the equipment.          Cardiac Rehab from 11/17/2014 in [facility] Cardiac Rehab   Date  11/17/14   Educator  DW   Instruction Review Code  1- partially meets, needs review/practice      Infection Prevention: - Provides verbal and written material to individual with discussion of infection control including proper hand washing and proper equipment cleaning during exercise session.      Cardiac Rehab from 11/17/2014 in [facility] Cardiac Rehab   Date  11/17/14   Educator  DW   Instruction Review Code  2- meets goals/outcomes      Falls Prevention: - Provides verbal and written material to individual with discussion of falls prevention and safety.      Cardiac Rehab from 11/17/2014 in [facility] Cardiac Rehab   Date  11/17/14   Educator  DW   Instruction Review Code  2- meets goals/outcomes      Diabetes: - Individual verbal and written instruction to review signs/symptoms of diabetes, desired ranges of glucose level fasting, after meals and with exercise. Advice that pre and post exercise glucose checks will be done for 3 sessions at entry of program.      Cardiac Rehab from 11/17/2014 in [facility] Cardiac Rehab   Date  11/17/14   Educator  DW   Instruction Review Code  2- meets goals/outcomes       Knowledge Questionnaire Score:     Knowledge Questionnaire Score - 11/17/14 1550    Knowledge Questionnaire Score   Pre Score  16/28       Personal Goals and Risk Factors at Admission:     Personal Goals and Risk Factors at Admission - 11/17/14 1600    Personal Goals and Risk Factors on Admission    Weight Management Yes   Admit Weight 237 lb 1.6 oz (107.548 kg)   Goal Weight 180 lb (81.647 kg)   Increase Aerobic Exercise and Physical Activity Yes   Intervention While in program, learn and follow the exercise prescription taught. Start at a low level workload and increase workload after able to maintain previous level for 30 minutes. Increase time before increasing intensity.   Quit Smoking Yes   Number of packs per day 0.25 pack per day currently.     Intervention Utilize your health care professional team to help with smoking cessation while in the program. Your doctor can prescribe medications to aid in cessation. The program can provide information and counseling as needed.   Take Less Medication Yes   Intervention Learn your risk factors and begin the lifestyle modifications for risk factor control during your time in the program.   Understand more about Heart/Pulmonary Disease. Yes   Intervention While in program utilize professionals for any questions, and attend the education sessions. Great websites to use are www.americanheart.org or www.lung.org for reliable information.   Diabetes Yes   Goal Blood glucose control identified by blood glucose values, HgbA1C. Participant verbalizes understanding of the signs/symptoms of hyper/hypo glycemia, proper foot care and importance of medication and nutrition plan for blood glucose control.   Intervention Provide nutrition & aerobic exercise along with prescribed medications to achieve blood glucose in normal ranges: Fasting 65-99 mg/dL   Hypertension Yes   Goal Participant will see blood pressure controlled within the values of 140/82mm/Hg or within value directed by their physician.   Intervention Provide nutrition & aerobic exercise along with prescribed medications to  achieve BP 140/90 or less.   Lipids Yes   Goal Cholesterol controlled with medications as prescribed, with individualized exercise RX and with personalized nutrition plan. Value goals: LDL < 70mg , HDL > 40mg . Participant states understanding of desired cholesterol values and following prescriptions.   Intervention Provide nutrition & aerobic exercise along with prescribed medications to achieve LDL 70mg , HDL >40mg .   Stress Yes   Goal To meet with psychosocial counselor for stress and relaxation information and guidance. To state understanding of performing relaxation techniques and or identifying personal stressors.   Intervention Provide education on types of stress, identifiying stressors, and ways to cope with stress. Provide demonstration and active practice of relaxation techniques.      Personal Goals and Risk Factors Review:      Goals and Risk Factor Review      01/11/15 1431  Other Goal   Goals Progress/Improvement seen  No       Comments Melinda [name] has been out over 2 months waiting for transportation to program. She is applying and is having conflict with setting up the transportation          Personal Goals Discharge (Final Personal Goals and Risk Factors Review):      Goals and Risk Factor Review - 01/11/15 1431    Other Goal   Goals Progress/Improvement seen  No   Comments Melinda [name] has been out over 2 months waiting for transportation to program. She is applying and is having conflict with setting up the transportation      ITP Comments:     ITP Comments      04/07/15 1132           ITP Comments 30 day review  HAs been absent, has told us she palns to return          Comments: continue with itp

## 2015-04-08 ENCOUNTER — Ambulatory Visit: Payer: Medicaid Other

## 2015-04-09 ENCOUNTER — Encounter: Payer: Self-pay | Admitting: Cardiovascular Disease

## 2015-04-09 ENCOUNTER — Ambulatory Visit (INDEPENDENT_AMBULATORY_CARE_PROVIDER_SITE_OTHER): Payer: Medicaid Other | Admitting: Cardiovascular Disease

## 2015-04-09 VITALS — BP 160/80 | HR 80 | Ht 69.0 in | Wt 249.5 lb

## 2015-04-09 DIAGNOSIS — I2510 Atherosclerotic heart disease of native coronary artery without angina pectoris: Secondary | ICD-10-CM | POA: Diagnosis not present

## 2015-04-09 DIAGNOSIS — E119 Type 2 diabetes mellitus without complications: Secondary | ICD-10-CM

## 2015-04-09 DIAGNOSIS — R0789 Other chest pain: Secondary | ICD-10-CM

## 2015-04-09 DIAGNOSIS — I10 Essential (primary) hypertension: Secondary | ICD-10-CM

## 2015-04-09 DIAGNOSIS — Z794 Long term (current) use of insulin: Secondary | ICD-10-CM

## 2015-04-09 DIAGNOSIS — I255 Ischemic cardiomyopathy: Secondary | ICD-10-CM

## 2015-04-09 DIAGNOSIS — E669 Obesity, unspecified: Secondary | ICD-10-CM

## 2015-04-09 DIAGNOSIS — Z720 Tobacco use: Secondary | ICD-10-CM

## 2015-04-09 DIAGNOSIS — IMO0001 Reserved for inherently not codable concepts without codable children: Secondary | ICD-10-CM

## 2015-04-09 MED ORDER — CLOPIDOGREL BISULFATE 75 MG PO TABS: 75.0000 mg | ORAL_TABLET | Freq: Every day | ORAL | Status: DC

## 2015-04-09 MED ORDER — METOPROLOL TARTRATE 25 MG PO TABS: 25.0000 mg | ORAL_TABLET | Freq: Two times a day (BID) | ORAL | Status: DC

## 2015-04-09 MED ORDER — LISINOPRIL 40 MG PO TABS: 40.0000 mg | ORAL_TABLET | Freq: Every day | ORAL | Status: DC

## 2015-04-09 MED ORDER — ATORVASTATIN CALCIUM 80 MG PO TABS: 80.0000 mg | ORAL_TABLET | Freq: Every day | ORAL | Status: DC

## 2015-04-09 NOTE — Assessment & Plan Note (Signed)
Currently with no symptoms of angina. No further workup at this time. Continue current medication regimen. 

## 2015-04-09 NOTE — Assessment & Plan Note (Signed)
Medication changes as above. We'll continue metoprolol, lisinopril at higher dose Recommended she monitor her blood pressure at home

## 2015-04-09 NOTE — Assessment & Plan Note (Signed)
We have encouraged her to continue to work on weaning her cigarettes and smoking cessation. She will continue to work on this and does not want any assistance with chantix.  

## 2015-04-09 NOTE — Progress Notes (Signed)
Patient ID: Melinda [name], female    DOB: 27-Jun-1955, 59 y.o.   MRN: [id number]  HPI Comments: 59 y.o. female with h/o CAD s/p 4 vessel CABG on 06/08/2014 secondary to NSTEMI,poorly controlled IDDM, HTN, ongoing tobacco abuse, obesity, anxiety, and peripheral neuropathy who was recently admitted to [facility] from 2/1-2/3 for NSTEMI. After undergoing cardiac cath on 2/3 that showed severe 3 vessel disease she was transferred to [facility] from 2/3-2/13 for . 4 vessel CABG (LIMA to LAD, SVG to Diagonal, SVG to OM, and SVG to PDA) She presents today for follow-up in the clinic for her coronary artery disease  In follow-up today, hemoglobin A1c greater than 12 Blood pressure is elevated, occasionally has atypical type chest discomfort No regular exercise program, works in a [place] to smoke Denies any leg edema, shortness of breath on exertion  EKG on today's visit shows normal sinus rhythm with rate 79 bpm, ST and T wave abnormality inferior leads, unchanged from previous EKG   other past medical history   presented to [facility] ED on 2/1 with headache, neck pain, nasal pain, chest tightness, nausea, and just overall did not feel well. Work up in the [facility] ED confirmed sinus infection, which she started on Augmentin for. She was also found to be hypotensive with BP 85/51. She received IV fluids with improvement in her BP to 120s/80s. Negative abdominal CT. She did have an elevated troponin at <0.02->1.30-->3.60-->7.80-->13.26-->23.00-->30.00. She was placed on a heparin gtt. Cardiac cath showed 95% stenosis mLAD, occlusion ostial OM1, 70% stenosis LCx, 95% stenosis mRCA, EF 45%. She underwent successful cabg on 06/08/2014 at [facility]. She tolerated this procedure well. Echo at [facility] showed EF 40-45%, HK of entire inferior and inferolateral wall c/w infarction of RCA or LCx, GR2DD, and mild TR. She was discharged on Lopressor 12.5 mg bid with labile BP. She was not started on an ACEi 2/2 labile BP. Hgb was stable  at discharge. She was continued on Lasix 40 mg daily. She was advised to follow up with her PCP for her blood sugars (pre op A1C 10.5%).       No Known Allergies  Current Outpatient Prescriptions on File Prior to Visit  Medication Sig Dispense Refill  . acetaminophen (TYLENOL) 500 MG tablet Take 1,000 mg by mouth every 8 (eight) hours as needed.     Marland Kitchen aspirin EC 81 MG tablet Take 1 tablet (81 mg total) by mouth daily. 90 tablet 3  . gabapentin (NEURONTIN) 600 MG tablet Take 800 mg by mouth 3 (three) times daily.     . Insulin Glargine (LANTUS) 100 UNIT/ML Solostar Pen Inject 40 Units into the skin 2 (two) times daily.     . metFORMIN (GLUCOPHAGE) 500 MG tablet Take 1,000 mg by mouth 2 (two) times daily.    Marland Kitchen oxyCODONE (OXY IR/ROXICODONE) 5 MG immediate release tablet Take 1-2 tablets (5-10 mg total) by mouth every 6 (six) hours as needed for severe pain. 20 tablet 0   No current facility-administered medications on file prior to visit.    Past Medical History  Diagnosis Date  . HTN (hypertension)   . Tobacco abuse   . Anxiety   . Peripheral neuropathy ([place])   . Obesity   . Coronary artery disease     a. NSTEMI 06/2014; b. 4 vessel CABG 06/08/2014 LIMA to LAD, SVG to Diagonal, SVG to OM, & SVG to PDA  . Ischemic cardiomyopathy     a. echo 06/04/14 EF 40-45%, HK  of entire inferolateral and inferior myocardium c.w infarct of RCA/LCx, GR2DD, mild MR  . IDDM (insulin dependent diabetes mellitus) ([place])   . GERD (gastroesophageal reflux disease)   . OA (osteoarthritis) of knee     Past Surgical History  Procedure Laterality Date  . Breast biopsy    . Toe amputation Right 12/2011    rt middle toe  . Cardiac catheterization  06/2014    95% stenosis mLAD, occlusion ostial OM1, 70% stenosis LCx, 95% stenosis mRCA, EF 45%.  . US echocardiography  06/2014    EF 50-55%, HK of inf/post/inferolat walls, Ao sclerosis  . Ct abd w & pelvis wo cm  06/2014    nl liver, gallbladder, spleen, mild  diverticular changes, no bowel wall inflammation, appendix nl, no hernia, no other sig abnormalities  . Coronary artery bypass graft N/A 06/08/2014    Procedure: CORONARY ARTERY BYPASS GRAFTING (CABG);  Surgeon: Grace Isaac, MD;  Location: [place];  Service: Open Heart Surgery;  Laterality: N/A;  Times 4 using left internal mammary artery to LAD artery and endoscopically harvested bilateral saphenous vein to Obtuse Marginal, Diagonal and Posterior Descending coronary arteries.  Darden Dates without cardioversion N/A 06/08/2014    Procedure: TRANSESOPHAGEAL ECHOCARDIOGRAM (TEE);  Surgeon: Grace Isaac, MD;  Location: [place];  Service: Open Heart Surgery;  Laterality: N/A;    Social History  reports that she has been smoking Cigarettes.  She has a 10 pack-year smoking history. She has never used smokeless tobacco. She reports that she does not drink alcohol or use illicit drugs.  Family History family history includes CAD in her father and mother; Hypertension in her mother.   Review of Systems  Constitutional: Negative.   Respiratory: Negative.   Cardiovascular: Negative.   Gastrointestinal: Negative.   Musculoskeletal: Negative.   Neurological: Negative.   Hematological: Negative.   Psychiatric/Behavioral: Negative.   All other systems reviewed and are negative.   BP 160/80 mmHg  Pulse 80  Ht 5\' 9"  (1.753 m)  Wt 249 lb 8 oz (113.172 kg)  BMI 36.83 kg/m2  Physical Exam  Constitutional: She is oriented to person, place, and time. She appears well-developed and well-nourished.  obese  HENT:  Head: Normocephalic.  Nose: Nose normal.  Mouth/Throat: Oropharynx is clear and moist.  Eyes: Conjunctivae are normal. Pupils are equal, round, and reactive to light.  Neck: Normal range of motion. Neck supple. No JVD present.  Cardiovascular: Normal rate, regular rhythm, S1 normal, S2 normal, normal heart sounds and intact distal pulses.  Exam reveals no gallop and no friction rub.   No  murmur heard. Pulmonary/Chest: Effort normal and breath sounds normal. No respiratory distress. She has no wheezes. She has no rales. She exhibits no tenderness.  Abdominal: Soft. Bowel sounds are normal. She exhibits no distension. There is no tenderness.  Musculoskeletal: Normal range of motion. She exhibits no edema or tenderness.  Lymphadenopathy:    She has no cervical adenopathy.  Neurological: She is alert and oriented to person, place, and time. Coordination normal.  Skin: Skin is warm and dry. No rash noted. No erythema.  Psychiatric: She has a normal mood and affect. Her behavior is normal. Judgment and thought content normal.    Assessment and Plan  Nursing note and vitals reviewed.

## 2015-04-09 NOTE — Assessment & Plan Note (Signed)
She appears relatively euvolemic on today's visit We will increase her lisinopril up to 40 mg daily given her severe hypertension

## 2015-04-09 NOTE — Assessment & Plan Note (Signed)
We have encouraged continued exercise, careful diet management in an effort to lose weight. 

## 2015-04-09 NOTE — Assessment & Plan Note (Signed)
Dietary guide provided today. Recommended exercise and weight loss Suspect some dietary indiscretion Reports she is compliant with her medication Recommended strict follow-up with primary care for aggressive diabetes control Hemoglobin A1c greater than 12 which places her at high risk of complications

## 2015-04-09 NOTE — Patient Instructions (Addendum)
You are doing well.  Please increase the lisinopril up to 40 mg daily  Try to quit smoking  Watch the carbs, breads, fries  Please call us if you have new issues that need to be addressed before your next appt.  Your physician wants you to follow-up in: 6 months.  You will receive a reminder letter in the mail two months in advance. If you don't receive a letter, please call our office to schedule the follow-up appointment.  Steps to Quit Smoking  Smoking tobacco can be harmful to your health and can affect almost every organ in your body. Smoking puts you, and those around you, at risk for developing many serious chronic diseases. Quitting smoking is difficult, but it is one of the best things that you can do for your health. It is never too late to quit. WHAT ARE THE BENEFITS OF QUITTING SMOKING? When you quit smoking, you lower your risk of developing serious diseases and conditions, such as:  Lung cancer or lung disease, such as COPD.  Heart disease.  Stroke.  Heart attack.  Infertility.  Osteoporosis and bone fractures. Additionally, symptoms such as coughing, wheezing, and shortness of breath may get better when you quit. You may also find that you get sick less often because your body is stronger at fighting off colds and infections. If you are pregnant, quitting smoking can help to reduce your chances of having a baby of low birth weight. HOW DO I GET READY TO QUIT? When you decide to quit smoking, create a plan to make sure that you are successful. Before you quit:  Pick a date to quit. Set a date within the next two weeks to give you time to prepare.  Write down the reasons why you are quitting. Keep this list in places where you will see it often, such as on your bathroom mirror or in your car or wallet.  Identify the people, places, things, and activities that make you want to smoke (triggers) and avoid them. Make sure to take these actions:  Throw away all  cigarettes at home, at work, and in your car.  Throw away smoking accessories, such as Scientist, research (medical).  Clean your car and make sure to empty the ashtray.  Clean your home, including curtains and carpets.  Tell your family, friends, and coworkers that you are quitting. Support from your loved ones can make quitting easier.  Talk with your health care provider about your options for quitting smoking.  Find out what treatment options are covered by your health insurance. WHAT STRATEGIES CAN I USE TO QUIT SMOKING?  Talk with your healthcare provider about different strategies to quit smoking. Some strategies include:  Quitting smoking altogether instead of gradually lessening how much you smoke over a period of time. Research shows that quitting "cold [place]" is more successful than gradually quitting.  Attending in-person counseling to help you build problem-solving skills. You are more likely to have success in quitting if you attend several counseling sessions. Even short sessions of 10 minutes can be effective.  Finding resources and support systems that can help you to quit smoking and remain smoke-free after you quit. These resources are most helpful when you use them often. They can include:  Online chats with a Social worker.  Telephone quitlines.  Printed Furniture conservator/restorer.  Support groups or group counseling.  Text messaging programs.  Mobile phone applications.  Taking medicines to help you quit smoking. (If you are pregnant or breastfeeding, talk  with your health care provider first.) Some medicines contain nicotine and some do not. Both types of medicines help with cravings, but the medicines that include nicotine help to relieve withdrawal symptoms. Your health care provider may recommend:  Nicotine patches, gum, or lozenges.  Nicotine inhalers or sprays.  Non-nicotine medicine that is taken by mouth. Talk with your health care provider about combining  strategies, such as taking medicines while you are also receiving in-person counseling. Using these two strategies together makes you more likely to succeed in quitting than if you used either strategy on its own. If you are pregnant or breastfeeding, talk with your health care provider about finding counseling or other support strategies to quit smoking. Do not take medicine to help you quit smoking unless told to do so by your health care provider. WHAT THINGS CAN I DO TO MAKE IT EASIER TO QUIT? Quitting smoking might feel overwhelming at first, but there is a lot that you can do to make it easier. Take these important actions:  Reach out to your family and friends and ask that they support and encourage you during this time. Call telephone quitlines, reach out to support groups, or work with a counselor for support.  Ask people who smoke to avoid smoking around you.  Avoid places that trigger you to smoke, such as bars, parties, or smoke-break areas at work.  Spend time around people who do not smoke.  Lessen stress in your life, because stress can be a smoking trigger for some people. To lessen stress, try:  Exercising regularly.  Deep-breathing exercises.  Yoga.  Meditating.  Performing a body scan. This involves closing your eyes, scanning your body from head to toe, and noticing which parts of your body are particularly tense. Purposefully relax the muscles in those areas.  Download or purchase mobile phone or tablet apps (applications) that can help you stick to your quit plan by providing reminders, tips, and encouragement. There are many free apps, such as QuitGuide from the [employer] Office manager for Disease Control and Prevention). You can find other support for quitting smoking (smoking cessation) through smokefree.gov and other websites. HOW WILL I FEEL WHEN I QUIT SMOKING? Within the first 24 hours of quitting smoking, you may start to feel some withdrawal symptoms. These symptoms are  usually most noticeable 2-3 days after quitting, but they usually do not last beyond 2-3 weeks. Changes or symptoms that you might experience include:  Mood swings.  Restlessness, anxiety, or irritation.  Difficulty concentrating.  Dizziness.  Strong cravings for sugary foods in addition to nicotine.  Mild weight gain.  Constipation.  Nausea.  Coughing or a sore throat.  Changes in how your medicines work in your body.  A depressed mood.  Difficulty sleeping (insomnia). After the first 2-3 weeks of quitting, you may start to notice more positive results, such as:  Improved sense of smell and taste.  Decreased coughing and sore throat.  Slower heart rate.  Lower blood pressure.  Clearer skin.  The ability to breathe more easily.  Fewer sick days. Quitting smoking is very challenging for most people. Do not get discouraged if you are not successful the first time. Some people need to make many attempts to quit before they achieve long-term success. Do your best to stick to your quit plan, and talk with your health care provider if you have any questions or concerns.   This information is not intended to replace advice given to you by your health care  provider. Make sure you discuss any questions you have with your health care provider.   Document Released: 04/11/2001 Document Revised: 09/01/2014 Document Reviewed: 09/01/2014 Elsevier Interactive Patient Education 2016 [employer]. Smoking Hazards Smoking cigarettes is extremely bad for your health. Tobacco smoke has over 200 known poisons in it. It contains the poisonous gases nitrogen oxide and carbon monoxide. There are over 60 chemicals in tobacco smoke that cause cancer. Some of the chemicals found in cigarette smoke include:   Cyanide.   Benzene.   Formaldehyde.   Methanol (wood alcohol).   Acetylene (fuel used in welding torches).   Ammonia.  Even smoking lightly shortens your life expectancy by  several years. You can greatly reduce the risk of medical problems for you and your family by stopping now. Smoking is the most preventable cause of death and disease in our society. Within days of quitting smoking, your circulation improves, you decrease the risk of having a heart attack, and your lung capacity improves. There may be some increased phlegm in the first few days after quitting, and it may take months for your lungs to clear up completely. Quitting for 10 years reduces your risk of developing lung cancer to almost that of a nonsmoker.  WHAT ARE THE RISKS OF SMOKING? Cigarette smokers have an increased risk of many serious medical problems, including:  Lung cancer.   Lung disease (such as pneumonia, bronchitis, and emphysema).   Heart attack and chest pain due to the heart not getting enough oxygen (angina).   Heart disease and peripheral blood vessel disease.   Hypertension.   Stroke.   Oral cancer (cancer of the lip, mouth, or voice box).   Bladder cancer.   Pancreatic cancer.   Cervical cancer.   Pregnancy complications, including premature birth.   Stillbirths and smaller newborn babies, birth defects, and genetic damage to sperm.   Early menopause.   Lower estrogen level for women.   Infertility.   Facial wrinkles.   Blindness.   Increased risk of broken bones (fractures).   Senile dementia.   Stomach ulcers and internal bleeding.   Delayed wound healing and increased risk of complications during surgery. Because of secondhand smoke exposure, children of smokers have an increased risk of the following:   Sudden infant death syndrome (SIDS).   Respiratory infections.   Lung cancer.   Heart disease.   Ear infections.  WHY IS SMOKING ADDICTIVE? Nicotine is the chemical agent in tobacco that is capable of causing addiction or dependence. When you smoke and inhale, nicotine is absorbed rapidly into the bloodstream through  your lungs. Both inhaled and noninhaled nicotine may be addictive.  WHAT ARE THE BENEFITS OF QUITTING?  There are many health benefits to quitting smoking. Some are:   The likelihood of developing cancer and heart disease decreases. Health improvements are seen almost immediately.   Blood pressure, pulse rate, and breathing patterns start returning to normal soon after quitting.   People who quit may see an improvement in their overall quality of life.  HOW DO YOU QUIT SMOKING? Smoking is an addiction with both physical and psychological effects, and longtime habits can be hard to change. Your health care provider can recommend:  Programs and community resources, which may include group support, education, or therapy.  Replacement products, such as patches, gum, and nasal sprays. Use these products only as directed. Do not replace cigarette smoking with electronic cigarettes (commonly called e-cigarettes). The safety of e-cigarettes is unknown, and some may contain  harmful chemicals. FOR MORE INFORMATION  American Lung Association: www.lung.org  American Cancer Society: www.cancer.org   This information is not intended to replace advice given to you by your health care provider. Make sure you discuss any questions you have with your health care provider.   Document Released: 05/25/2004 Document Revised: 02/05/2013 Document Reviewed: 10/07/2012 Elsevier Interactive Patient Education 2016 [place] WHAT IS SECONDHAND SMOKE? Secondhand smoke is smoke that comes from burning tobacco. It could be the smoke from a cigarette, a pipe, or a cigar. Even if you are not the one smoking, secondhand smoke exposes you to the dangers of smoking. This is called involuntary, or passive, smoking. There are two types of secondhand smoke:  Sidestream smoke is the smoke that comes off the lighted end of a cigarette, pipe, or cigar.  This type of smoke has the highest amount of  cancer-causing agents (carcinogens).  The particles in sidestream smoke are smaller. They get into your lungs more easily.  Mainstream smoke is the smoke that is exhaled by a person who is smoking.  This type of smoke is also dangerous to your health. HOW CAN SECONDHAND SMOKE AFFECT MY HEALTH? Studies show that there is no safe level of secondhand smoke. This smoke contains thousands of chemicals. At least 19 of them are known to cause cancer. Secondhand smoke can also cause many other health problems. It has been linked to:  Lung cancer.  Cancer of the voice box (larynx) or throat.  Cancer of the sinuses.  Brain cancer.  Bladder cancer.  Stomach cancer.  Breast cancer.  White blood cell cancers (lymphoma and leukemia).  Brain and liver tumors in children.  Heart disease and stroke in adults.  Pregnancy loss (miscarriage).  Diseases in children, such as:  Asthma.  Lung infections.  Ear infections.  Sudden infant death syndrome (SIDS).  Slow growth. WHERE CAN I BE AT RISK FOR EXPOSURE TO SECONDHAND SMOKE?   For adults, the workplace is the main source of exposure to secondhand smoke.  Your workplace should have a policy separating smoking areas from nonsmoking areas.  Smoking areas should have a system for ventilating and cleaning the air.  For children, the home may be the most dangerous place for exposure to secondhand smoke.  Children who live in apartment buildings may be at risk from smoke drifting from hallways or other people's homes.  For everyone, many public places are possible sources of exposure to secondhand smoke.  These places include restaurants, shopping centers, and parks. HOW CAN I REDUCE MY RISK FOR EXPOSURE TO SECONDHAND SMOKE? The most important thing you can do is not smoke. Discourage family members from smoking. Other ways to reduce exposure for you and your family include the following:  Keep your home smoke free.  Make sure  your child care providers do not smoke.  Warn your child about the dangers of smoking and secondhand smoke.  Do not allow smoking in your car. When someone smokes in a car, all the damaging chemicals from the smoke are confined in a small area.  Avoid public places where smoking is allowed.   This information is not intended to replace advice given to you by your health care provider. Make sure you discuss any questions you have with your health care provider.   Document Released: 05/25/2004 Document Revised: 05/08/2014 Document Reviewed: 08/01/2013 Elsevier Interactive Patient Education [employer].

## 2015-04-12 ENCOUNTER — Ambulatory Visit: Payer: Medicaid Other

## 2015-04-13 ENCOUNTER — Ambulatory Visit: Payer: Medicaid Other

## 2015-04-14 ENCOUNTER — Ambulatory Visit: Payer: Medicaid Other

## 2015-04-15 ENCOUNTER — Ambulatory Visit: Payer: Medicaid Other

## 2015-04-19 ENCOUNTER — Ambulatory Visit: Payer: Medicaid Other

## 2015-04-20 ENCOUNTER — Ambulatory Visit: Payer: Medicaid Other

## 2015-04-21 ENCOUNTER — Ambulatory Visit: Payer: Medicaid Other

## 2015-04-22 ENCOUNTER — Ambulatory Visit: Payer: Medicaid Other

## 2015-04-27 ENCOUNTER — Encounter: Payer: Self-pay | Admitting: *Deleted

## 2015-04-27 ENCOUNTER — Ambulatory Visit: Payer: Medicaid Other

## 2015-04-28 ENCOUNTER — Ambulatory Visit: Payer: Medicaid Other

## 2015-04-29 ENCOUNTER — Ambulatory Visit: Payer: Medicaid Other

## 2015-04-30 ENCOUNTER — Encounter: Payer: Self-pay | Admitting: *Deleted

## 2015-04-30 ENCOUNTER — Telehealth: Payer: Self-pay | Admitting: *Deleted

## 2015-04-30 DIAGNOSIS — Z951 Presence of aortocoronary bypass graft: Secondary | ICD-10-CM

## 2015-04-30 NOTE — Progress Notes (Signed)
Cardiac Individual Treatment Plan  Patient Details  Name: Melinda [name] MRN: [id number] Date of Birth: 1955-10-14 Referring Provider:  Minna Merritts, MD  Initial Encounter Date:  11/17/2014  Visit Diagnosis: S/P CABG x 4  Patient's Home Medications on Admission:  Current outpatient prescriptions:  .  acetaminophen (TYLENOL) 500 MG tablet, Take 1,000 mg by mouth every 8 (eight) hours as needed. , Disp: , Rfl:  .  aspirin EC 81 MG tablet, Take 1 tablet (81 mg total) by mouth daily., Disp: 90 tablet, Rfl: 3 .  atorvastatin (LIPITOR) 80 MG tablet, Take 1 tablet (80 mg total) by mouth daily at 6 PM., Disp: 30 tablet, Rfl: 11 .  clopidogrel (PLAVIX) 75 MG tablet, Take 1 tablet (75 mg total) by mouth daily., Disp: 30 tablet, Rfl: 11 .  gabapentin (NEURONTIN) 600 MG tablet, Take 800 mg by mouth 3 (three) times daily. , Disp: , Rfl:  .  Insulin Glargine (LANTUS) 100 UNIT/ML Solostar Pen, Inject 40 Units into the skin 2 (two) times daily. , Disp: , Rfl:  .  lisinopril (PRINIVIL,ZESTRIL) 40 MG tablet, Take 1 tablet (40 mg total) by mouth daily., Disp: 30 tablet, Rfl: 11 .  metFORMIN (GLUCOPHAGE) 500 MG tablet, Take 1,000 mg by mouth 2 (two) times daily., Disp: , Rfl:  .  metoprolol tartrate (LOPRESSOR) 25 MG tablet, Take 1 tablet (25 mg total) by mouth 2 (two) times daily., Disp: 60 tablet, Rfl: 11 .  oxyCODONE (OXY IR/ROXICODONE) 5 MG immediate release tablet, Take 1-2 tablets (5-10 mg total) by mouth every 6 (six) hours as needed for severe pain., Disp: 20 tablet, Rfl: 0  Past Medical History: Past Medical History  Diagnosis Date  . HTN (hypertension)   . Tobacco abuse   . Anxiety   . Peripheral neuropathy ([place])   . Obesity   . Coronary artery disease     a. NSTEMI 06/2014; b. 4 vessel CABG 06/08/2014 LIMA to LAD, SVG to Diagonal, SVG to OM, & SVG to PDA  . Ischemic cardiomyopathy     a. echo 06/04/14 EF 40-45%, HK of entire inferolateral and inferior myocardium c.w infarct of RCA/LCx,  GR2DD, mild MR  . IDDM (insulin dependent diabetes mellitus) ([place])   . GERD (gastroesophageal reflux disease)   . OA (osteoarthritis) of knee     Tobacco Use: History  Smoking status  . Current Every Day Smoker -- 0.25 packs/day for 40 years  . Types: Cigarettes  Smokeless tobacco  . Never Used    Labs: Recent Review Flowsheet Data    Labs for ITP Cardiac and Pulmonary Rehab Latest Ref Rng 06/08/2014 06/08/2014 06/08/2014 06/08/2014 06/09/2014   PHART 7.350 - 7.450 7.333(L) 7.331(L) 7.366 - -   PCO2ART 35.0 - 45.0 mmHg 45.0 47.6(H) 48.0(H) - -   HCO3 20.0 - 24.0 mEq/L 24.1(H) 25.4(H) 27.6(H) - -   TCO2 0 - 100 mmol/L 25 27 29 23 22    ACIDBASEDEF 0.0 - 2.0 mmol/L 2.0 1.0 - - -   O2SAT - 86.0 95.0 93.0 - -       Exercise Target Goals:    Exercise Program Goal: Individual exercise prescription set with THRR, safety & activity barriers. Participant demonstrates ability to understand and report RPE using BORG scale, to self-measure pulse accurately, and to acknowledge the importance of the exercise prescription.  Exercise Prescription Goal: Starting with aerobic activity 30 plus minutes a day, 3 days per week for initial exercise prescription. Provide home exercise prescription and guidelines that participant  acknowledges understanding prior to discharge.  Activity Barriers & Risk Stratification:     Activity Barriers & Risk Stratification - 11/17/14 1545    Activity Barriers & Risk Stratification   Activity Barriers Arthritis;Joint Problems;Back Problems;Balance Concerns;History of Falls   Risk Stratification High      6 Minute Walk:     6 Minute Walk      11/17/14 1428       6 Minute Walk   Phase Initial     Distance 450 feet     Walk Time 3.75 minutes     Resting HR 99 bpm     Resting BP 118/70 mmHg     Max Ex. HR 120 bpm     Max Ex. BP 144/64 mmHg     RPE 17     Symptoms Yes (comment)     Comments Painful cramping in the calf muscles on R and L side         Initial Exercise Prescription:     Initial Exercise Prescription - 11/17/14 1400    Date of Initial Exercise Prescription   Date 11/17/14   Treadmill   MPH 0.8   Grade 0   Minutes 5  Use intervals to complete time goals   Bike   Level 0.2   Watts 10   Minutes 10   Recumbant Bike   Level 1   RPM 30   Watts 10   Minutes 10   NuStep   Level 1   Watts 15   Minutes 10   Arm Ergometer   Level 1   Watts 8   Minutes 10   Arm/Foot Ergometer   Level 4   Watts 12   Minutes 10   Cybex   Level 1   RPM 40   Minutes 10   Recumbant Elliptical   Level 1   RPM 30   Watts 10   Minutes 10   REL-XR   Level 1   Watts 30   Minutes 10   Prescription Details   Frequency (times per week) 3   Duration Progress to 30 minutes of continuous aerobic without signs/symptoms of physical distress   Intensity   THRR REST +  30   Ratings of Perceived Exertion 11-15   Progression Continue progressive overload as per policy without signs/symptoms or physical distress.   Resistance Training   Training Prescription Yes   Weight 1   Reps 10-15      Exercise Prescription Changes:     Exercise Prescription Changes      12/08/14 0600 01/11/15 1400 01/12/15 0600 02/02/15 0600 03/02/15 0700   Exercise Review   Progression No  Has not started yet No  [name] has been absent the entire month. Plans to return as  No  Nechelle has been absent the entire month. Plans to return as  No  Absent since medical review on 11/17/14 No  Absent since medical review on 11/17/14     04/27/15 0800           Exercise Review   Progression No  Absent since medical review on 11/17/14          Discharge Exercise Prescription (Final Exercise Prescription Changes):     Exercise Prescription Changes - 04/27/15 0800    Exercise Review   Progression No  Absent since medical review on 11/17/14      Nutrition:  Target Goals: Understanding of nutrition guidelines, daily intake of sodium 1500mg ,  cholesterol 200mg , calories  30% from fat and 7% or less from saturated fats, daily to have 5 or more servings of fruits and vegetables.  Biometrics:     Pre Biometrics - 11/17/14 1425    Pre Biometrics   Height 5' 9.5" (1.765 m)   Weight 237 lb 1.6 oz (107.548 kg)   BMI (Calculated) 34.6         Post Biometrics - 11/17/14 1425     Post  Biometrics   Waist Circumference 43.5 inches   Hip Circumference 54.5 inches   Waist to Hip Ratio 0.8 %      Nutrition Therapy Plan and Nutrition Goals:   Nutrition Discharge: Rate Your Plate Scores:   Nutrition Goals Re-Evaluation:   Psychosocial: Target Goals: Acknowledge presence or absence of depression, maximize coping skills, provide positive support system. Participant is able to verbalize types and ability to use techniques and skills needed for reducing stress and depression.  Initial Review & Psychosocial Screening:     Initial Psych Review & Screening - 11/17/14 1603    Initial Review   Current issues with Current Depression   Family Dynamics   Good Support System? Yes  Patient lives alone.  Relies on her sister to drive her places.  Sister seems supportive.  Pt has church affiliation, as she attends [employer].  She states she does feel depressed.  Has Celexa for anxiety, but has not been taking it.     Barriers   Psychosocial barriers to participate in program The patient should benefit from training in stress management and relaxation.   Screening Interventions   Interventions Encouraged to exercise;Program counselor consult      Quality of Life Scores:     Quality of Life - 11/17/14 1606    Quality of Life Scores   Health/Function Pre 4.64 %   Socioeconomic Pre 12.36 %   Psych/Spiritual Pre 10.5 %   Family Pre 20 %   GLOBAL Pre 9.19 %      PHQ-9:     Recent Review Flowsheet Data    Depression screen [facility] 2/9 11/17/2014   Decreased Interest 1   Down, Depressed, Hopeless 1   PHQ - 2  Score 2   Altered sleeping 1   Tired, decreased energy 3   Change in appetite 3   Feeling bad or failure about yourself  1   Trouble concentrating 0   Moving slowly or fidgety/restless 1   Suicidal thoughts 0   PHQ-9 Score 11   Difficult doing work/chores Very difficult      Psychosocial Evaluation and Intervention:   Psychosocial Re-Evaluation:   Vocational Rehabilitation: Provide vocational rehab assistance to qualifying candidates.   Vocational Rehab Evaluation & Intervention:     Vocational Rehab - 11/17/14 1554    Initial Vocational Rehab Evaluation & Intervention   Assessment shows need for Vocational Rehabilitation No      Education: Education Goals: Education classes will be provided on a weekly basis, covering required topics. Participant will state understanding/return demonstration of topics presented.  Learning Barriers/Preferences:     Learning Barriers/Preferences - 11/17/14 1546    Learning Barriers/Preferences   Learning Barriers Sight;Exercise Concerns   Learning Preferences Video      Education Topics: General Nutrition Guidelines/Fats and Fiber: -Group instruction provided by verbal, written material, models and posters to present the general guidelines for heart healthy nutrition. Gives an explanation and review of dietary fats and fiber.   Controlling Sodium/Reading Food Labels: -Group verbal and written  material supporting the discussion of sodium use in heart healthy nutrition. Review and explanation with models, verbal and written materials for utilization of the food label.   Exercise Physiology & Risk Factors: - Group verbal and written instruction with models to review the exercise physiology of the cardiovascular system and associated critical values. Details cardiovascular disease risk factors and the goals associated with each risk factor.   Aerobic Exercise & Resistance Training: - Gives group verbal and written discussion on  the health impact of inactivity. On the components of aerobic and resistive training programs and the benefits of this training and how to safely progress through these programs.   Flexibility, Balance, General Exercise Guidelines: - Provides group verbal and written instruction on the benefits of flexibility and balance training programs. Provides general exercise guidelines with specific guidelines to those with heart or lung disease. Demonstration and skill practice provided.   Stress Management: - Provides group verbal and written instruction about the health risks of elevated stress, cause of high stress, and healthy ways to reduce stress.   Depression: - Provides group verbal and written instruction on the correlation between heart/lung disease and depressed mood, treatment options, and the stigmas associated with seeking treatment.   Anatomy & Physiology of the Heart: - Group verbal and written instruction and models provide basic cardiac anatomy and physiology, with the coronary electrical and arterial systems. Review of: AMI, Angina, Valve disease, Heart Failure, Cardiac Arrhythmia, Pacemakers, and the ICD.   Cardiac Procedures: - Group verbal and written instruction and models to describe the testing methods done to diagnose heart disease. Reviews the outcomes of the test results. Describes the treatment choices: Medical Management, Angioplasty, or Coronary Bypass Surgery.   Cardiac Medications: - Group verbal and written instruction to review commonly prescribed medications for heart disease. Reviews the medication, class of the drug, and side effects. Includes the steps to properly store meds and maintain the prescription regimen.   Go Sex-Intimacy & Heart Disease, Get SMART - Goal Setting: - Group verbal and written instruction through game format to discuss heart disease and the return to sexual intimacy. Provides group verbal and written material to discuss and apply goal  setting through the application of the S.M.A.R.T. Method.   Other Matters of the Heart: - Provides group verbal, written materials and models to describe Heart Failure, Angina, Valve Disease, and Diabetes in the realm of heart disease. Includes description of the disease process and treatment options available to the cardiac patient.   Exercise & Equipment Safety: - Individual verbal instruction and demonstration of equipment use and safety with use of the equipment.          Cardiac Rehab from 11/17/2014 in [facility] Cardiac and Pulmonary Rehab   Date  11/17/14   Educator  DW   Instruction Review Code  1- partially meets, needs review/practice      Infection Prevention: - Provides verbal and written material to individual with discussion of infection control including proper hand washing and proper equipment cleaning during exercise session.      Cardiac Rehab from 11/17/2014 in [facility] Cardiac and Pulmonary Rehab   Date  11/17/14   Educator  DW   Instruction Review Code  2- meets goals/outcomes      Falls Prevention: - Provides verbal and written material to individual with discussion of falls prevention and safety.      Cardiac Rehab from 11/17/2014 in [facility] Cardiac and Pulmonary Rehab   Date  11/17/14   Educator  DW   Instruction Review Code  2- meets goals/outcomes      Diabetes: - Individual verbal and written instruction to review signs/symptoms of diabetes, desired ranges of glucose level fasting, after meals and with exercise. Advice that pre and post exercise glucose checks will be done for 3 sessions at entry of program.      Cardiac Rehab from 11/17/2014 in [facility] Cardiac and Pulmonary Rehab   Date  11/17/14   Educator  DW   Instruction Review Code  2- meets goals/outcomes       Knowledge Questionnaire Score:     Knowledge Questionnaire Score - 11/17/14 1550    Knowledge Questionnaire Score   Pre Score  16/28      Personal Goals and Risk Factors at Admission:      Personal Goals and Risk Factors at Admission - 11/17/14 1600    Personal Goals and Risk Factors on Admission    Weight Management Yes   Admit Weight 237 lb 1.6 oz (107.548 kg)   Goal Weight 180 lb (81.647 kg)   Increase Aerobic Exercise and Physical Activity Yes   Intervention While in program, learn and follow the exercise prescription taught. Start at a low level workload and increase workload after able to maintain previous level for 30 minutes. Increase time before increasing intensity.   Quit Smoking Yes   Number of packs per day 0.25 pack per day currently.     Intervention Utilize your health care professional team to help with smoking cessation while in the program. Your doctor can prescribe medications to aid in cessation. The program can provide information and counseling as needed.   Take Less Medication Yes   Intervention Learn your risk factors and begin the lifestyle modifications for risk factor control during your time in the program.   Understand more about Heart/Pulmonary Disease. Yes   Intervention While in program utilize professionals for any questions, and attend the education sessions. Great websites to use are www.americanheart.org or www.lung.org for reliable information.   Diabetes Yes   Goal Blood glucose control identified by blood glucose values, HgbA1C. Participant verbalizes understanding of the signs/symptoms of hyper/hypo glycemia, proper foot care and importance of medication and nutrition plan for blood glucose control.   Intervention Provide nutrition & aerobic exercise along with prescribed medications to achieve blood glucose in normal ranges: Fasting 65-99 mg/dL   Hypertension Yes   Goal Participant will see blood pressure controlled within the values of 140/20mm/Hg or within value directed by their physician.   Intervention Provide nutrition & aerobic exercise along with prescribed medications to achieve BP 140/90 or less.   Lipids Yes   Goal Cholesterol  controlled with medications as prescribed, with individualized exercise RX and with personalized nutrition plan. Value goals: LDL < 70mg , HDL > 40mg . Participant states understanding of desired cholesterol values and following prescriptions.   Intervention Provide nutrition & aerobic exercise along with prescribed medications to achieve LDL 70mg , HDL >40mg .   Stress Yes   Goal To meet with psychosocial counselor for stress and relaxation information and guidance. To state understanding of performing relaxation techniques and or identifying personal stressors.   Intervention Provide education on types of stress, identifiying stressors, and ways to cope with stress. Provide demonstration and active practice of relaxation techniques.      Personal Goals and Risk Factors Review:      Goals and Risk Factor Review      01/11/15 1431  Other Goal   Goals Progress/Improvement seen  No       Comments Merab has been out over 2 months waiting for transportation to program. She is applying and is having conflict with setting up the transportation          Personal Goals Discharge (Final Personal Goals and Risk Factors Review):      Goals and Risk Factor Review - 01/11/15 1431    Other Goal   Goals Progress/Improvement seen  No   Comments Carriann has been out over 2 months waiting for transportation to program. She is applying and is having conflict with setting up the transportation      ITP Comments:     ITP Comments      04/07/15 1132 04/30/15 1516         ITP Comments 30 day review  HAs been absent, has told us she palns to return Has been absent since 11/17/14. Today she asked to be discharged.          Comments: discharged per Bobi's request

## 2015-04-30 NOTE — Progress Notes (Signed)
Discharge Summary  Patient Details  Name: Melinda [name] MRN: [id number] Date of Birth: January 05, 1956 Referring Provider:  Minna Merritts, MD   Number of Visits: 1  Reason for Discharge:  Early Exit:  Personal  Requested to be discharged  Attended orientation in July, never started the sessions.  Smoking History:  History  Smoking status  . Current Every Day Smoker -- 0.25 packs/day for 40 years  . Types: Cigarettes  Smokeless tobacco  . Never Used    Diagnosis:  S/P CABG x 4  ADL [facility]SD:   Initial Exercise Prescription:     Initial Exercise Prescription - 11/17/14 1400    Date of Initial Exercise Prescription   Date 11/17/14   Treadmill   MPH 0.8   Grade 0   Minutes 5  Use intervals to complete time goals   Bike   Level 0.2   Watts 10   Minutes 10   Recumbant Bike   Level 1   RPM 30   Watts 10   Minutes 10   NuStep   Level 1   Watts 15   Minutes 10   Arm Ergometer   Level 1   Watts 8   Minutes 10   Arm/Foot Ergometer   Level 4   Watts 12   Minutes 10   Cybex   Level 1   RPM 40   Minutes 10   Recumbant Elliptical   Level 1   RPM 30   Watts 10   Minutes 10   REL-XR   Level 1   Watts 30   Minutes 10   Prescription Details   Frequency (times per week) 3   Duration Progress to 30 minutes of continuous aerobic without signs/symptoms of physical distress   Intensity   THRR REST +  30   Ratings of Perceived Exertion 11-15   Progression Continue progressive overload as per policy without signs/symptoms or physical distress.   Resistance Training   Training Prescription Yes   Weight 1   Reps 10-15      Discharge Exercise Prescription (Final Exercise Prescription Changes):     Exercise Prescription Changes - 04/27/15 0800    Exercise Review   Progression No  Absent since medical review on 11/17/14      Functional Capacity:     6 Minute Walk      11/17/14 1428       6 Minute Walk   Phase Initial     Distance 450 feet     Walk  Time 3.75 minutes     Resting HR 99 bpm     Resting BP 118/70 mmHg     Max Ex. HR 120 bpm     Max Ex. BP 144/64 mmHg     RPE 17     Symptoms Yes (comment)     Comments Painful cramping in the calf muscles on R and L side        Psychological, QOL, Others - Outcomes: PHQ 2/9: Depression screen PHQ 2/9 11/17/2014  Decreased Interest 1  Down, Depressed, Hopeless 1  PHQ - 2 Score 2  Altered sleeping 1  Tired, decreased energy 3  Change in appetite 3  Feeling bad or failure about yourself  1  Trouble concentrating 0  Moving slowly or fidgety/restless 1  Suicidal thoughts 0  PHQ-9 Score 11  Difficult doing work/chores Very difficult    Quality of Life:     Quality of Life - 11/17/14 1606  Quality of Life Scores   Health/Function Pre 4.64 %   Socioeconomic Pre 12.36 %   Psych/Spiritual Pre 10.5 %   Family Pre 20 %   GLOBAL Pre 9.19 %      Personal Goals: Goals established at orientation with interventions provided to work toward goal.     Personal Goals and Risk Factors at Admission - 11/17/14 1600    Personal Goals and Risk Factors on Admission    Weight Management Yes   Admit Weight 237 lb 1.6 oz (107.548 kg)   Goal Weight 180 lb (81.647 kg)   Increase Aerobic Exercise and Physical Activity Yes   Intervention While in program, learn and follow the exercise prescription taught. Start at a low level workload and increase workload after able to maintain previous level for 30 minutes. Increase time before increasing intensity.   Quit Smoking Yes   Number of packs per day 0.25 pack per day currently.     Intervention Utilize your health care professional team to help with smoking cessation while in the program. Your doctor can prescribe medications to aid in cessation. The program can provide information and counseling as needed.   Take Less Medication Yes   Intervention Learn your risk factors and begin the lifestyle modifications for risk factor control during your  time in the program.   Understand more about Heart/Pulmonary Disease. Yes   Intervention While in program utilize professionals for any questions, and attend the education sessions. Great websites to use are www.americanheart.org or www.lung.org for reliable information.   Diabetes Yes   Goal Blood glucose control identified by blood glucose values, HgbA1C. Participant verbalizes understanding of the signs/symptoms of hyper/hypo glycemia, proper foot care and importance of medication and nutrition plan for blood glucose control.   Intervention Provide nutrition & aerobic exercise along with prescribed medications to achieve blood glucose in normal ranges: Fasting 65-99 mg/dL   Hypertension Yes   Goal Participant will see blood pressure controlled within the values of 140/93mm/Hg or within value directed by their physician.   Intervention Provide nutrition & aerobic exercise along with prescribed medications to achieve BP 140/90 or less.   Lipids Yes   Goal Cholesterol controlled with medications as prescribed, with individualized exercise RX and with personalized nutrition plan. Value goals: LDL < 70mg , HDL > 40mg . Participant states understanding of desired cholesterol values and following prescriptions.   Intervention Provide nutrition & aerobic exercise along with prescribed medications to achieve LDL 70mg , HDL >40mg .   Stress Yes   Goal To meet with psychosocial counselor for stress and relaxation information and guidance. To state understanding of performing relaxation techniques and or identifying personal stressors.   Intervention Provide education on types of stress, identifiying stressors, and ways to cope with stress. Provide demonstration and active practice of relaxation techniques.       Personal Goals Discharge:     Goals and Risk Factor Review      01/11/15 1431           Other Goal   Goals Progress/Improvement seen  No       Comments Evy has been out over 2 months  waiting for transportation to program. She is applying and is having conflict with setting up the transportation          Nutrition & Weight - Outcomes:     Pre Biometrics - 11/17/14 1425    Pre Biometrics   Height 5' 9.5" (1.765 m)   Weight 237 lb 1.6 oz (  107.548 kg)   BMI (Calculated) 34.6         Post Biometrics - 11/17/14 1425     Post  Biometrics   Waist Circumference 43.5 inches   Hip Circumference 54.5 inches   Waist to Hip Ratio 0.8 %      Nutrition:   Nutrition Discharge:   Education Questionnaire Score:     Knowledge Questionnaire Score - 11/17/14 1550    Knowledge Questionnaire Score   Pre Score  16/28      Goals reviewed with patient; copy given to patient.

## 2015-04-30 NOTE — Telephone Encounter (Signed)
Called to check on status to return to program. Vaughan Basta asked to be discharged from program.

## 2015-05-04 ENCOUNTER — Ambulatory Visit: Payer: Medicaid Other

## 2015-05-05 ENCOUNTER — Ambulatory Visit: Payer: Medicaid Other

## 2015-05-06 ENCOUNTER — Ambulatory Visit: Payer: Medicaid Other

## 2015-05-10 ENCOUNTER — Ambulatory Visit: Payer: Medicaid Other

## 2015-05-11 ENCOUNTER — Ambulatory Visit: Payer: Medicaid Other

## 2015-05-12 ENCOUNTER — Ambulatory Visit: Payer: Medicaid Other

## 2015-05-13 ENCOUNTER — Ambulatory Visit: Payer: Medicaid Other

## 2015-05-17 ENCOUNTER — Ambulatory Visit: Payer: Medicaid Other

## 2015-05-18 ENCOUNTER — Ambulatory Visit: Payer: Medicaid Other

## 2015-05-19 ENCOUNTER — Ambulatory Visit: Payer: Medicaid Other

## 2015-05-20 ENCOUNTER — Ambulatory Visit: Payer: Medicaid Other

## 2015-05-24 ENCOUNTER — Ambulatory Visit: Payer: Medicaid Other

## 2015-05-25 ENCOUNTER — Encounter: Payer: Medicaid Other | Attending: Cardiovascular Disease

## 2015-05-25 ENCOUNTER — Ambulatory Visit: Payer: Medicaid Other

## 2015-05-26 ENCOUNTER — Ambulatory Visit: Payer: Medicaid Other

## 2015-05-27 ENCOUNTER — Ambulatory Visit: Payer: Medicaid Other

## 2015-05-31 ENCOUNTER — Ambulatory Visit: Payer: Medicaid Other

## 2015-06-01 ENCOUNTER — Ambulatory Visit: Payer: Medicaid Other

## 2015-06-02 ENCOUNTER — Ambulatory Visit: Payer: Medicaid Other

## 2015-06-03 ENCOUNTER — Ambulatory Visit: Payer: Medicaid Other

## 2015-06-07 ENCOUNTER — Ambulatory Visit: Payer: Medicaid Other

## 2015-06-08 ENCOUNTER — Ambulatory Visit: Payer: Medicaid Other

## 2015-06-09 ENCOUNTER — Ambulatory Visit: Payer: Medicaid Other

## 2015-06-10 ENCOUNTER — Ambulatory Visit: Payer: Medicaid Other

## 2015-06-14 ENCOUNTER — Ambulatory Visit: Payer: Medicaid Other

## 2015-06-15 ENCOUNTER — Ambulatory Visit: Payer: Medicaid Other

## 2015-06-16 ENCOUNTER — Ambulatory Visit: Payer: Medicaid Other

## 2015-06-17 ENCOUNTER — Ambulatory Visit: Payer: Medicaid Other

## 2015-06-21 ENCOUNTER — Ambulatory Visit: Payer: Medicaid Other

## 2015-06-22 ENCOUNTER — Ambulatory Visit: Payer: Medicaid Other

## 2015-06-23 ENCOUNTER — Ambulatory Visit: Payer: Medicaid Other

## 2015-06-24 ENCOUNTER — Ambulatory Visit: Payer: Medicaid Other

## 2015-06-28 ENCOUNTER — Ambulatory Visit: Payer: Medicaid Other

## 2015-06-29 ENCOUNTER — Ambulatory Visit: Payer: Medicaid Other

## 2015-06-30 ENCOUNTER — Ambulatory Visit: Payer: Medicaid Other

## 2015-07-01 ENCOUNTER — Ambulatory Visit: Payer: Medicaid Other

## 2015-07-06 ENCOUNTER — Ambulatory Visit: Payer: Medicaid Other

## 2015-07-08 ENCOUNTER — Ambulatory Visit: Payer: Medicaid Other

## 2015-07-13 ENCOUNTER — Ambulatory Visit: Payer: Medicaid Other

## 2015-07-15 ENCOUNTER — Ambulatory Visit: Payer: Medicaid Other

## 2015-07-20 ENCOUNTER — Ambulatory Visit: Payer: Medicaid Other

## 2015-07-22 ENCOUNTER — Ambulatory Visit: Payer: Medicaid Other

## 2015-07-27 ENCOUNTER — Ambulatory Visit: Payer: Medicaid Other

## 2015-07-29 ENCOUNTER — Ambulatory Visit: Payer: Medicaid Other

## 2015-08-03 ENCOUNTER — Ambulatory Visit: Payer: Medicaid Other

## 2015-08-05 ENCOUNTER — Ambulatory Visit: Payer: Medicaid Other

## 2015-08-10 ENCOUNTER — Ambulatory Visit: Payer: Medicaid Other

## 2015-08-12 ENCOUNTER — Ambulatory Visit: Payer: Medicaid Other

## 2015-08-17 ENCOUNTER — Ambulatory Visit: Payer: Medicaid Other

## 2015-08-19 ENCOUNTER — Ambulatory Visit: Payer: Medicaid Other

## 2015-09-05 ENCOUNTER — Encounter: Payer: Self-pay | Admitting: *Deleted

## 2015-09-05 ENCOUNTER — Emergency Department
Admission: EM | Admit: 2015-09-05 | Discharge: 2015-09-06 | Disposition: A | Discharge status: Home / Self Care | Payer: Medicaid Other | Attending: Emergency Medicine | Admitting: Emergency Medicine

## 2015-09-05 DIAGNOSIS — I2510 Atherosclerotic heart disease of native coronary artery without angina pectoris: Secondary | ICD-10-CM | POA: Insufficient documentation

## 2015-09-05 DIAGNOSIS — Z7982 Long term (current) use of aspirin: Secondary | ICD-10-CM | POA: Diagnosis not present

## 2015-09-05 DIAGNOSIS — F17210 Nicotine dependence, cigarettes, uncomplicated: Secondary | ICD-10-CM | POA: Diagnosis not present

## 2015-09-05 DIAGNOSIS — Z79899 Other long term (current) drug therapy: Secondary | ICD-10-CM | POA: Insufficient documentation

## 2015-09-05 DIAGNOSIS — Z7902 Long term (current) use of antithrombotics/antiplatelets: Secondary | ICD-10-CM | POA: Insufficient documentation

## 2015-09-05 DIAGNOSIS — I10 Essential (primary) hypertension: Secondary | ICD-10-CM | POA: Diagnosis not present

## 2015-09-05 DIAGNOSIS — Z951 Presence of aortocoronary bypass graft: Secondary | ICD-10-CM | POA: Diagnosis not present

## 2015-09-05 DIAGNOSIS — M179 Osteoarthritis of knee, unspecified: Secondary | ICD-10-CM | POA: Insufficient documentation

## 2015-09-05 DIAGNOSIS — Z794 Long term (current) use of insulin: Secondary | ICD-10-CM | POA: Insufficient documentation

## 2015-09-05 DIAGNOSIS — Z7984 Long term (current) use of oral hypoglycemic drugs: Secondary | ICD-10-CM | POA: Insufficient documentation

## 2015-09-05 DIAGNOSIS — R1013 Epigastric pain: Secondary | ICD-10-CM | POA: Diagnosis not present

## 2015-09-05 DIAGNOSIS — Z8679 Personal history of other diseases of the circulatory system: Secondary | ICD-10-CM | POA: Diagnosis not present

## 2015-09-05 DIAGNOSIS — K859 Acute pancreatitis, unspecified: Secondary | ICD-10-CM

## 2015-09-05 DIAGNOSIS — R112 Nausea with vomiting, unspecified: Secondary | ICD-10-CM | POA: Diagnosis present

## 2015-09-05 DIAGNOSIS — E669 Obesity, unspecified: Secondary | ICD-10-CM | POA: Insufficient documentation

## 2015-09-05 DIAGNOSIS — E119 Type 2 diabetes mellitus without complications: Secondary | ICD-10-CM | POA: Insufficient documentation

## 2015-09-05 LAB — COMPREHENSIVE METABOLIC PANEL
ALT: 12 U/L — ABNORMAL LOW (ref 14–54)
AST: 13 U/L — ABNORMAL LOW (ref 15–41)
Albumin: 3.3 g/dL — ABNORMAL LOW (ref 3.5–5.0)
Alkaline Phosphatase: 101 U/L (ref 38–126)
Anion gap: 10 (ref 5–15)
BUN: 18 mg/dL (ref 6–20)
CO2: 26 mmol/L (ref 22–32)
Calcium: 8.5 mg/dL — ABNORMAL LOW (ref 8.9–10.3)
Chloride: 102 mmol/L (ref 101–111)
Creatinine, Ser: 1.54 mg/dL — ABNORMAL HIGH (ref 0.44–1.00)
GFR calc Af Amer: 41 mL/min — ABNORMAL LOW (ref >60)
GFR calc non Af Amer: 35 mL/min — ABNORMAL LOW (ref >60)
Glucose, Bld: 226 mg/dL — ABNORMAL HIGH (ref 65–99)
Potassium: 3.7 mmol/L (ref 3.5–5.1)
Sodium: 138 mmol/L (ref 135–145)
Total Bilirubin: 0.4 mg/dL (ref 0.3–1.2)
Total Protein: 7.1 g/dL (ref 6.5–8.1)

## 2015-09-05 LAB — CBC
HCT: 44 % (ref 35.0–47.0)
Hemoglobin: 14.4 g/dL (ref 12.0–16.0)
MCH: 24.8 pg — ABNORMAL LOW (ref 26.0–34.0)
MCHC: 32.7 g/dL (ref 32.0–36.0)
MCV: 75.7 fL — ABNORMAL LOW (ref 80.0–100.0)
Platelets: 339 10*3/uL (ref 150–440)
RBC: 5.81 MIL/uL — ABNORMAL HIGH (ref 3.80–5.20)
RDW: 16.3 % — ABNORMAL HIGH (ref 11.5–14.5)
WBC: 9.2 10*3/uL (ref 3.6–11.0)

## 2015-09-05 LAB — LIPASE, BLOOD: Lipase: 60 U/L — ABNORMAL HIGH (ref 11–51)

## 2015-09-05 LAB — TROPONIN I: Troponin I: <0.03 ng/mL (ref <0.031)

## 2015-09-05 MED ORDER — MORPHINE SULFATE (PF) 4 MG/ML IV SOLN
4.0000 mg | Freq: Once | INTRAVENOUS | Status: AC
  Administered 2015-09-06: 4 mg via INTRAVENOUS
  Filled 2015-09-05: qty 1

## 2015-09-05 MED ORDER — ONDANSETRON HCL 4 MG/2ML IJ SOLN
4.0000 mg | Freq: Once | INTRAMUSCULAR | Status: AC
  Administered 2015-09-06: 4 mg via INTRAVENOUS
  Filled 2015-09-05: qty 2

## 2015-09-05 MED ORDER — SODIUM CHLORIDE 0.9 % IV BOLUS (SEPSIS)
1000.0000 mL | Freq: Once | INTRAVENOUS | Status: AC
  Administered 2015-09-06: 1000 mL via INTRAVENOUS

## 2015-09-05 NOTE — ED Notes (Signed)
Pt c/o of abdominal pain, N/V/D beginning April 27. Pt reports 1 emesis today. Pt has not had a bm in 3 days, and last BM was loose/liquid. Pt has faint abdominal sounds all quadrants. Pt tender LRQ, URQ, ULQ, with most severity in [place].

## 2015-09-05 NOTE — ED Notes (Signed)
Pt c/o abdominal pain since 08/28/15. Pt c/o n/v intermittently, vomiting last prior to arrival in ED. Pt c/o diarrhea last episode +2 days ago. Pt c/o persistent upper abdominal pain since 08/28/15. Pt has not seen PCP or any other medical provider, states she has appointment 09/15/15 to see PCP. Pt c/o sharp pain on R flank and dysuria that is persistent.  Pt c/o dark discharge from vagina over past two weeks.

## 2015-09-05 NOTE — ED Provider Notes (Signed)
[facility] Emergency Department Provider Note   ____________________________________________  Time seen: Approximately 11:08 PM  I have reviewed the triage vital signs and the nursing notes.   HISTORY  Chief Complaint Abdominal Pain and Emesis    HPI Melinda [name] is a 60 y.o. female who presents to the ED from home with a chief complain of abdominal pain, nausea and vomiting. Patient reports abdominal pain beginning April 27. Describes aching upper abdominal pain radiating to right flank; worsened with food. Patient reports 3 episodes of emesis today. Also reports history of constipation; no bowel movement 3 days. Usually patient has a bowel movement every other day. + Flatus. Denies associated symptoms of fever, chills, chest pain, shortness of breath. Denies recent travel or trauma. Nothing makes her pain better or worse.   Past Medical History  Diagnosis Date  . HTN (hypertension)   . Tobacco abuse   . Anxiety   . Peripheral neuropathy ([place])   . Obesity   . Coronary artery disease     a. NSTEMI 06/2014; b. 4 vessel CABG 06/08/2014 LIMA to LAD, SVG to Diagonal, SVG to OM, & SVG to PDA  . Ischemic cardiomyopathy     a. echo 06/04/14 EF 40-45%, HK of entire inferolateral and inferior myocardium c.w infarct of RCA/LCx, GR2DD, mild MR  . IDDM (insulin dependent diabetes mellitus) ([place])   . GERD (gastroesophageal reflux disease)   . OA (osteoarthritis) of knee     Patient Active Problem List   Diagnosis Date Noted  . IDDM (insulin dependent diabetes mellitus) ([place])   . Peripheral neuropathy ([place])   . Tobacco abuse   . Obesity   . Ischemic cardiomyopathy   . 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 CAD (coronary artery disease) 06/08/2014  . Coronary artery disease involving native coronary artery of native heart without angina pectoris   . HTN (hypertension)   . DM2 (diabetes mellitus, type 2) ([place])   . GERD (gastroesophageal reflux disease)   . NSTEMI  (non-ST elevated myocardial infarction) ([place]) 06/03/2014    Past Surgical History  Procedure Laterality Date  . Breast biopsy    . Toe amputation Right 12/2011    rt middle toe  . Cardiac catheterization  06/2014    95% stenosis mLAD, occlusion ostial OM1, 70% stenosis LCx, 95% stenosis mRCA, EF 45%.  . US echocardiography  06/2014    EF 50-55%, HK of inf/post/inferolat walls, Ao sclerosis  . Ct abd w & pelvis wo cm  06/2014    nl liver, gallbladder, spleen, mild diverticular changes, no bowel wall inflammation, appendix nl, no hernia, no other sig abnormalities  . Coronary artery bypass graft N/A 06/08/2014    Procedure: CORONARY ARTERY BYPASS GRAFTING (CABG);  Surgeon: Grace Isaac, MD;  Location: [place];  Service: Open Heart Surgery;  Laterality: N/A;  Times 4 using left internal mammary artery to LAD artery and endoscopically harvested bilateral saphenous vein to Obtuse Marginal, Diagonal and Posterior Descending coronary arteries.  [place] Dates without cardioversion N/A 06/08/2014    Procedure: TRANSESOPHAGEAL ECHOCARDIOGRAM (TEE);  Surgeon: Grace Isaac, MD;  Location: [place];  Service: Open Heart Surgery;  Laterality: N/A;    Current Outpatient Rx  Name  Route  Sig  Dispense  Refill  . aspirin EC 81 MG tablet   Oral   Take 1 tablet (81 mg total) by mouth daily.   90 tablet   3   . atorvastatin (LIPITOR) 80 MG tablet  Oral   Take 1 tablet (80 mg total) by mouth daily at 6 PM.   30 tablet   11   . clopidogrel (PLAVIX) 75 MG tablet   Oral   Take 1 tablet (75 mg total) by mouth daily.   30 tablet   11   . gabapentin (NEURONTIN) 600 MG tablet   Oral   Take 800 mg by mouth 3 (three) times daily.          Marland Kitchen EXPIRED: Insulin Glargine (LANTUS) 100 UNIT/ML Solostar Pen   Subcutaneous   Inject 40 Units into the skin 2 (two) times daily.          Marland Kitchen lisinopril (PRINIVIL,ZESTRIL) 40 MG tablet   Oral   Take 1 tablet (40 mg total) by mouth daily.   30 tablet   11   .  metFORMIN (GLUCOPHAGE) 500 MG tablet   Oral   Take 1,000 mg by mouth 2 (two) times daily.         . metoprolol tartrate (LOPRESSOR) 25 MG tablet   Oral   Take 1 tablet (25 mg total) by mouth 2 (two) times daily.   60 tablet   11   . oxyCODONE (OXY IR/ROXICODONE) 5 MG immediate release tablet   Oral   Take 1-2 tablets (5-10 mg total) by mouth every 6 (six) hours as needed for severe pain.   20 tablet   0     Allergies Review of patient's allergies indicates no known allergies.  Family History  Problem Relation Age of Onset  . CAD Mother   . Hypertension Mother   . CAD Father     Social History Social History  Substance Use Topics  . Smoking status: Current Every Day Smoker -- 0.25 packs/day for 40 years    Types: Cigarettes  . Smokeless tobacco: Never Used  . Alcohol Use: No    Review of Systems  Constitutional: No fever/chills. Eyes: No visual changes. ENT: No sore throat. Cardiovascular: Denies chest pain. Respiratory: Denies shortness of breath. Gastrointestinal: Positive for abdominal pain, nausea and vomiting.  No diarrhea.  No constipation. Genitourinary: Negative for dysuria. Musculoskeletal: Negative for back pain. Skin: Negative for rash. Neurological: Negative for headaches, focal weakness or numbness.  10-point ROS otherwise negative.  ____________________________________________   PHYSICAL EXAM:  VITAL SIGNS: ED Triage Vitals  Enc Vitals Group     BP 09/05/15 2124 143/74 mmHg     Pulse Rate 09/05/15 2124 79     Resp 09/05/15 2124 18     Temp 09/05/15 2124 98.2 F (36.8 C)     Temp Source 09/05/15 2124 Oral     SpO2 09/05/15 2124 100 %     Weight 09/05/15 2124 245 lb (111.131 kg)     Height 09/05/15 2124 5\' 8"  (1.727 m)     Head Cir --      Peak Flow --      Pain Score 09/05/15 2256 9     Pain Loc --      Pain Edu? --      Excl. in [place]? --     Constitutional: Alert and oriented. Well appearing and in mild acute distress. Eyes:  Conjunctivae are normal. PERRL. EOMI. Head: Atraumatic. Nose: No congestion/rhinnorhea. Mouth/Throat: Mucous membranes are moist.  Oropharynx non-erythematous. Neck: No stridor.   Cardiovascular: Normal rate, regular rhythm. Grossly normal heart sounds.  Good peripheral circulation. Respiratory: Normal respiratory effort.  No retractions. Lungs CTAB. Gastrointestinal: Obese. Soft and moderately tender to  palpation epigastrium and right upper quadrant without rebound or guarding. No distention. Decreased bowel sounds. No abdominal bruits. No CVA tenderness. Musculoskeletal: No lower extremity tenderness nor edema.  No joint effusions. Neurologic:  Normal speech and language. No gross focal neurologic deficits are appreciated. No gait instability. Skin:  Skin is warm, dry and intact. No rash noted. Psychiatric: Mood and affect are normal. Speech and behavior are normal.  ____________________________________________   LABS (all labs ordered are listed, but only abnormal results are displayed)  Labs Reviewed  LIPASE, BLOOD - Abnormal; Notable for the following:    Lipase 60 (*)    All other components within normal limits  COMPREHENSIVE METABOLIC PANEL - Abnormal; Notable for the following:    Glucose, Bld 226 (*)    Creatinine, Ser 1.54 (*)    Calcium 8.5 (*)    Albumin 3.3 (*)    AST 13 (*)    ALT 12 (*)    GFR calc non Af Amer 35 (*)    GFR calc Af Amer 41 (*)    All other components within normal limits  CBC - Abnormal; Notable for the following:    RBC 5.81 (*)    MCV 75.7 (*)    MCH 24.8 (*)    RDW 16.3 (*)    All other components within normal limits  URINALYSIS COMPLETEWITH MICROSCOPIC ([facility] ONLY) - Abnormal; Notable for the following:    Color, Urine YELLOW (*)    APPearance CLEAR (*)    Glucose, UA 150 (*)    Hgb urine dipstick 1+ (*)    Protein, ur 100 (*)    Squamous Epithelial / LPF 0-5 (*)    All other components within normal limits  TROPONIN I    ____________________________________________  EKG  ED ECG REPORT I, Johonna Binette J, the attending physician, personally viewed and interpreted this ECG.   Date: 09/05/2015  EKG Time: 2121  Rate: 79  Rhythm: normal EKG, normal sinus rhythm  Axis: LAD  Intervals:none  ST&T Change: Decreased T inferior laterally  ____________________________________________  RADIOLOGY  Ultrasound interpreted per Dr. Alroy Dust: Dilatation of the extrahepatic bile ducts, probably not significantly changed from prior studies. No mass or calculus evident. Normal liver.  CT Abdomen/Pelvis interpreted per Dr. Alroy Dust: Diverticulosis. No acute findings are evident. ____________________________________________   PROCEDURES  Procedure(s) performed: None  Critical Care performed: No  ____________________________________________   INITIAL IMPRESSION / ASSESSMENT AND PLAN / ED COURSE  Pertinent labs & imaging results that were available during my care of the patient were reviewed by me and considered in my medical decision making (see chart for details).  60 year old female who presents with epigastric and right upper quadrant abdominal pain associated with 3 episodes of vomiting. Laboratory results notable for mildly increased lipase, normal white count. Will initiate IV fluid resuscitation, IV analgesia and start with limited RUQ ultrasound to evaluate for cholecystitis.  ----------------------------------------- 3:03 AM on 09/06/2015 -----------------------------------------  Updated patient of ultrasound results. Pain improved. Reexamined abdomen; patient remains moderately tender in her epigastrium and now umbilicus. Recalls she ate summer sausage prior to onset of pain this evening. Will administer IV Protonix and obtain CT abdomen/pelvis to evaluate for intra-abdominal pathology.  ----------------------------------------- 4:51 AM on  09/06/2015 -----------------------------------------  Updated patient of CT and urinalysis results. Patient is overall feeling better. Limited prescriptions for analgesia and antiemetic for mild pancreatitis. Patient will follow-up with PCP this week. Strict return precautions given. Patient verbalizes understanding and agrees with plan of care. ____________________________________________  FINAL CLINICAL IMPRESSION(S) / ED DIAGNOSES  Final diagnoses:  Epigastric pain  Non-intractable vomiting with nausea, vomiting of unspecified type      NEW MEDICATIONS STARTED DURING THIS VISIT:  New Prescriptions   No medications on file     Note:  This document was prepared using Dragon voice recognition software and may include unintentional dictation errors.    Paulette Blanch, MD 09/06/15 [phone]

## 2015-09-06 ENCOUNTER — Emergency Department: Payer: Medicaid Other

## 2015-09-06 LAB — URINALYSIS COMPLETE WITH MICROSCOPIC (ARMC ONLY)
Bacteria, UA: NONE SEEN
Bilirubin Urine: NEGATIVE
Glucose, UA: 150 mg/dL — AB
Ketones, ur: NEGATIVE mg/dL
Leukocytes, UA: NEGATIVE
Nitrite: NEGATIVE
Protein, ur: 100 mg/dL — AB
Specific Gravity, Urine: 1.018 (ref 1.005–1.030)
pH: 7 (ref 5.0–8.0)

## 2015-09-06 MED ORDER — ONDANSETRON 4 MG PO TBDP: 4.0000 mg | ORAL_TABLET | Freq: Three times a day (TID) | ORAL | Status: DC | PRN

## 2015-09-06 MED ORDER — IOPAMIDOL (ISOVUE-300) INJECTION 61%
100.0000 mL | Freq: Once | INTRAVENOUS | Status: AC | PRN
  Administered 2015-09-06: 100 mL via INTRAVENOUS

## 2015-09-06 MED ORDER — PANTOPRAZOLE SODIUM 40 MG IV SOLR
40.0000 mg | Freq: Once | INTRAVENOUS | Status: AC
  Administered 2015-09-06: 40 mg via INTRAVENOUS
  Filled 2015-09-06: qty 40

## 2015-09-06 MED ORDER — DIATRIZOATE MEGLUMINE & SODIUM 66-10 % PO SOLN
15.0000 mL | Freq: Once | ORAL | Status: AC
  Administered 2015-09-06: 15 mL via ORAL

## 2015-09-06 MED ORDER — HYDROCODONE-ACETAMINOPHEN 5-325 MG PO TABS: 1.0000 | ORAL_TABLET | Freq: Four times a day (QID) | ORAL | Status: DC | PRN

## 2015-09-06 NOTE — Discharge Instructions (Signed)
1. You may take pain and nausea medicines as needed (Norco/Zofran #15). 2. Clear liquid diet 12 hours, then bland diet 3 days, then slowly advance diet as tolerated. 3. Return to the ER for worsening symptoms, persistent vomiting, difficulty breathing or other concerns.  Abdominal Pain, Adult Many things can cause abdominal pain. Usually, abdominal pain is not caused by a disease and will improve without treatment. It can often be observed and treated at home. Your health care provider will do a physical exam and possibly order blood tests and X-rays to help determine the seriousness of your pain. However, in many cases, more time must pass before a clear cause of the pain can be found. Before that point, your health care provider may not know if you need more testing or further treatment. HOME CARE INSTRUCTIONS Monitor your abdominal pain for any changes. The following actions may help to alleviate any discomfort you are experiencing:  Only take over-the-counter or prescription medicines as directed by your health care provider.  Do not take laxatives unless directed to do so by your health care provider.  Try a clear liquid diet (broth, tea, or water) as directed by your health care provider. Slowly move to a bland diet as tolerated. SEEK MEDICAL CARE IF:  You have unexplained abdominal pain.  You have abdominal pain associated with nausea or diarrhea.  You have pain when you urinate or have a bowel movement.  You experience abdominal pain that wakes you in the night.  You have abdominal pain that is worsened or improved by eating food.  You have abdominal pain that is worsened with eating fatty foods.  You have a fever. SEEK IMMEDIATE MEDICAL CARE IF:  Your pain does not go away within 2 hours.  You keep throwing up (vomiting).  Your pain is felt only in portions of the abdomen, such as the right side or the left lower portion of the abdomen.  You pass bloody or black tarry  stools. MAKE SURE YOU:  Understand these instructions.  Will watch your condition.  Will get help right away if you are not doing well or get worse.   This information is not intended to replace advice given to you by your health care provider. Make sure you discuss any questions you have with your health care provider.   Document Released: 01/25/2005 Document Revised: 01/06/2015 Document Reviewed: 12/25/2012 Elsevier Interactive Patient Education 2016 [employer]  Nausea and Vomiting Nausea is a sick feeling that often comes before throwing up (vomiting). Vomiting is a reflex where stomach contents come out of your mouth. Vomiting can cause severe loss of body fluids (dehydration). Children and elderly adults can become dehydrated quickly, especially if they also have diarrhea. Nausea and vomiting are symptoms of a condition or disease. It is important to find the cause of your symptoms. CAUSES   Direct irritation of the stomach lining. This irritation can result from increased acid production (gastroesophageal reflux disease), infection, food poisoning, taking certain medicines (such as nonsteroidal anti-inflammatory drugs), alcohol use, or tobacco use.  Signals from the brain.These signals could be caused by a headache, heat exposure, an inner ear disturbance, increased pressure in the brain from injury, infection, a tumor, or a concussion, pain, emotional stimulus, or metabolic problems.  An obstruction in the gastrointestinal tract (bowel obstruction).  Illnesses such as diabetes, hepatitis, gallbladder problems, appendicitis, kidney problems, cancer, sepsis, atypical symptoms of a heart attack, or eating disorders.  Medical treatments such as chemotherapy and radiation.  Receiving medicine that makes you sleep (general anesthetic) during surgery. DIAGNOSIS Your caregiver may ask for tests to be done if the problems do not improve after a few days. Tests may also be done if  symptoms are severe or if the reason for the nausea and vomiting is not clear. Tests may include:  Urine tests.  Blood tests.  Stool tests.  Cultures (to look for evidence of infection).  X-rays or other imaging studies. Test results can help your caregiver make decisions about treatment or the need for additional tests. TREATMENT You need to stay well hydrated. Drink frequently but in small amounts.You may wish to drink water, sports drinks, clear broth, or eat frozen ice pops or gelatin dessert to help stay hydrated.When you eat, eating slowly may help prevent nausea.There are also some antinausea medicines that may help prevent nausea. HOME CARE INSTRUCTIONS   Take all medicine as directed by your caregiver.  If you do not have an appetite, do not force yourself to eat. However, you must continue to drink fluids.  If you have an appetite, eat a normal diet unless your caregiver tells you differently.  Eat a variety of complex carbohydrates (rice, wheat, potatoes, bread), lean meats, yogurt, fruits, and vegetables.  Avoid high-fat foods because they are more difficult to digest.  Drink enough water and fluids to keep your urine clear or pale yellow.  If you are dehydrated, ask your caregiver for specific rehydration instructions. Signs of dehydration may include:  Severe thirst.  Dry lips and mouth.  Dizziness.  Dark urine.  Decreasing urine frequency and amount.  Confusion.  Rapid breathing or pulse. SEEK IMMEDIATE MEDICAL CARE IF:   You have blood or brown flecks (like coffee grounds) in your vomit.  You have black or bloody stools.  You have a severe headache or stiff neck.  You are confused.  You have severe abdominal pain.  You have chest pain or trouble breathing.  You do not urinate at least once every 8 hours.  You develop cold or clammy skin.  You continue to vomit for longer than 24 to 48 hours.  You have a fever. MAKE SURE YOU:    Understand these instructions.  Will watch your condition.  Will get help right away if you are not doing well or get worse.   This information is not intended to replace advice given to you by your health care provider. Make sure you discuss any questions you have with your health care provider.   Document Released: 04/17/2005 Document Revised: 07/10/2011 Document Reviewed: 09/14/2010 Elsevier Interactive Patient Education 2016 [employer].  Acute Pancreatitis Acute pancreatitis is a disease in which the pancreas becomes suddenly inflamed. The pancreas is a large gland located behind your stomach. The pancreas produces enzymes that help digest food. The pancreas also releases the hormones glucagon and insulin that help regulate blood sugar. Damage to the pancreas occurs when the digestive enzymes from the pancreas are activated and begin attacking the pancreas before being released into the intestine. Most acute attacks last a couple of days and can cause serious complications. Some people become dehydrated and develop low blood pressure. In severe cases, bleeding into the pancreas can lead to shock and can be life-threatening. The lungs, heart, and kidneys may fail. CAUSES  Pancreatitis can happen to anyone. In some cases, the cause is unknown. Most cases are caused by:  Alcohol abuse.  Gallstones. Other less common causes are:  Certain medicines.  Exposure to certain chemicals.  Infection.  Damage caused by an accident (trauma).  Abdominal surgery. SYMPTOMS   Pain in the upper abdomen that may radiate to the back.  Tenderness and swelling of the abdomen.  Nausea and vomiting. DIAGNOSIS  Your caregiver will perform a physical exam. Blood and stool tests may be done to confirm the diagnosis. Imaging tests may also be done, such as X-rays, CT scans, or an ultrasound of the abdomen. TREATMENT  Treatment usually requires a stay in the hospital. Treatment may  include:  Pain medicine.  Fluid replacement through an intravenous line (IV).  Placing a tube in the stomach to remove stomach contents and control vomiting.  Not eating for 3 or 4 days. This gives your pancreas a rest, because enzymes are not being produced that can cause further damage.  Antibiotic medicines if your condition is caused by an infection.  Surgery of the pancreas or gallbladder. HOME CARE INSTRUCTIONS   Follow the diet advised by your caregiver. This may involve avoiding alcohol and decreasing the amount of fat in your diet.  Eat smaller, more frequent meals. This reduces the amount of digestive juices the pancreas produces.  Drink enough fluids to keep your urine clear or pale yellow.  Only take over-the-counter or prescription medicines as directed by your caregiver.  Avoid drinking alcohol if it caused your condition.  Do not smoke.  Get plenty of rest.  Check your blood sugar at home as directed by your caregiver.  Keep all follow-up appointments as directed by your caregiver. SEEK MEDICAL CARE IF:   You do not recover as quickly as expected.  You develop new or worsening symptoms.  You have persistent pain, weakness, or nausea.  You recover and then have another episode of pain. SEEK IMMEDIATE MEDICAL CARE IF:   You are unable to eat or keep fluids down.  Your pain becomes severe.  You have a fever or persistent symptoms for more than 2 to 3 days.  You have a fever and your symptoms suddenly get worse.  Your skin or the white part of your eyes turn yellow (jaundice).  You develop vomiting.  You feel dizzy, or you faint.  Your blood sugar is high (over 300 mg/dL). MAKE SURE YOU:   Understand these instructions.  Will watch your condition.  Will get help right away if you are not doing well or get worse.   This information is not intended to replace advice given to you by your health care provider. Make sure you discuss any  questions you have with your health care provider.   Document Released: 04/17/2005 Document Revised: 10/17/2011 Document Reviewed: 07/27/2011 Elsevier Interactive Patient Education [employer].

## 2015-10-02 ENCOUNTER — Inpatient Hospital Stay
Admission: EM | Admit: 2015-10-02 | Discharge: 2015-10-04 | DRG: 603 | Disposition: A | Discharge status: Home / Self Care | Payer: Medicaid Other | Attending: Internal Medicine | Admitting: Internal Medicine

## 2015-10-02 ENCOUNTER — Emergency Department: Payer: Medicaid Other

## 2015-10-02 ENCOUNTER — Encounter: Payer: Self-pay | Admitting: Emergency Medicine

## 2015-10-02 DIAGNOSIS — I252 Old myocardial infarction: Secondary | ICD-10-CM

## 2015-10-02 DIAGNOSIS — E669 Obesity, unspecified: Secondary | ICD-10-CM | POA: Diagnosis present

## 2015-10-02 DIAGNOSIS — M179 Osteoarthritis of knee, unspecified: Secondary | ICD-10-CM | POA: Diagnosis present

## 2015-10-02 DIAGNOSIS — L03032 Cellulitis of left toe: Secondary | ICD-10-CM | POA: Diagnosis not present

## 2015-10-02 DIAGNOSIS — E11649 Type 2 diabetes mellitus with hypoglycemia without coma: Secondary | ICD-10-CM | POA: Diagnosis present

## 2015-10-02 DIAGNOSIS — Z7982 Long term (current) use of aspirin: Secondary | ICD-10-CM | POA: Diagnosis not present

## 2015-10-02 DIAGNOSIS — E1165 Type 2 diabetes mellitus with hyperglycemia: Secondary | ICD-10-CM | POA: Diagnosis present

## 2015-10-02 DIAGNOSIS — Z9889 Other specified postprocedural states: Secondary | ICD-10-CM

## 2015-10-02 DIAGNOSIS — F17210 Nicotine dependence, cigarettes, uncomplicated: Secondary | ICD-10-CM | POA: Diagnosis present

## 2015-10-02 DIAGNOSIS — R739 Hyperglycemia, unspecified: Secondary | ICD-10-CM | POA: Diagnosis not present

## 2015-10-02 DIAGNOSIS — N179 Acute kidney failure, unspecified: Secondary | ICD-10-CM | POA: Diagnosis present

## 2015-10-02 DIAGNOSIS — K219 Gastro-esophageal reflux disease without esophagitis: Secondary | ICD-10-CM | POA: Diagnosis present

## 2015-10-02 DIAGNOSIS — Z79891 Long term (current) use of opiate analgesic: Secondary | ICD-10-CM

## 2015-10-02 DIAGNOSIS — Z8249 Family history of ischemic heart disease and other diseases of the circulatory system: Secondary | ICD-10-CM | POA: Diagnosis not present

## 2015-10-02 DIAGNOSIS — Z89421 Acquired absence of other right toe(s): Secondary | ICD-10-CM

## 2015-10-02 DIAGNOSIS — I255 Ischemic cardiomyopathy: Secondary | ICD-10-CM | POA: Diagnosis present

## 2015-10-02 DIAGNOSIS — Z951 Presence of aortocoronary bypass graft: Secondary | ICD-10-CM | POA: Diagnosis not present

## 2015-10-02 DIAGNOSIS — L97529 Non-pressure chronic ulcer of other part of left foot with unspecified severity: Secondary | ICD-10-CM | POA: Diagnosis present

## 2015-10-02 DIAGNOSIS — Z79899 Other long term (current) drug therapy: Secondary | ICD-10-CM

## 2015-10-02 DIAGNOSIS — E11621 Type 2 diabetes mellitus with foot ulcer: Secondary | ICD-10-CM | POA: Diagnosis present

## 2015-10-02 DIAGNOSIS — E1142 Type 2 diabetes mellitus with diabetic polyneuropathy: Secondary | ICD-10-CM | POA: Diagnosis present

## 2015-10-02 DIAGNOSIS — Z9119 Patient's noncompliance with other medical treatment and regimen: Secondary | ICD-10-CM | POA: Diagnosis not present

## 2015-10-02 DIAGNOSIS — M86172 Other acute osteomyelitis, left ankle and foot: Secondary | ICD-10-CM

## 2015-10-02 DIAGNOSIS — M869 Osteomyelitis, unspecified: Secondary | ICD-10-CM | POA: Diagnosis present

## 2015-10-02 DIAGNOSIS — E785 Hyperlipidemia, unspecified: Secondary | ICD-10-CM | POA: Diagnosis present

## 2015-10-02 DIAGNOSIS — Z794 Long term (current) use of insulin: Secondary | ICD-10-CM

## 2015-10-02 DIAGNOSIS — I10 Essential (primary) hypertension: Secondary | ICD-10-CM | POA: Diagnosis present

## 2015-10-02 DIAGNOSIS — I2510 Atherosclerotic heart disease of native coronary artery without angina pectoris: Secondary | ICD-10-CM | POA: Diagnosis present

## 2015-10-02 HISTORY — DX: Osteomyelitis, unspecified: M86.9

## 2015-10-02 LAB — GLUCOSE, CAPILLARY
Glucose-Capillary: 400 mg/dL — ABNORMAL HIGH (ref 65–99)
Glucose-Capillary: 353 mg/dL — ABNORMAL HIGH (ref 65–99)

## 2015-10-02 LAB — COMPREHENSIVE METABOLIC PANEL
ALT: 14 U/L (ref 14–54)
AST: 18 U/L (ref 15–41)
Albumin: 3.6 g/dL (ref 3.5–5.0)
Alkaline Phosphatase: 133 U/L — ABNORMAL HIGH (ref 38–126)
Anion gap: 11 (ref 5–15)
BUN: 16 mg/dL (ref 6–20)
CO2: 21 mmol/L — ABNORMAL LOW (ref 22–32)
Calcium: 8.9 mg/dL (ref 8.9–10.3)
Chloride: 101 mmol/L (ref 101–111)
Creatinine, Ser: 1.57 mg/dL — ABNORMAL HIGH (ref 0.44–1.00)
GFR calc Af Amer: 40 mL/min — ABNORMAL LOW (ref >60)
GFR calc non Af Amer: 35 mL/min — ABNORMAL LOW (ref >60)
Glucose, Bld: 510 mg/dL (ref 65–99)
Potassium: 4.8 mmol/L (ref 3.5–5.1)
Sodium: 133 mmol/L — ABNORMAL LOW (ref 135–145)
Total Bilirubin: 0.3 mg/dL (ref 0.3–1.2)
Total Protein: 6.9 g/dL (ref 6.5–8.1)

## 2015-10-02 LAB — CBC WITH DIFFERENTIAL/PLATELET
Basophils Absolute: 0.1 10*3/uL (ref 0–0.1)
Basophils Relative: 1 %
Eosinophils Absolute: 0.4 10*3/uL (ref 0–0.7)
Eosinophils Relative: 5 %
HCT: 42.9 % (ref 35.0–47.0)
Hemoglobin: 13.7 g/dL (ref 12.0–16.0)
Lymphocytes Relative: 25 %
Lymphs Abs: 2.1 10*3/uL (ref 1.0–3.6)
MCH: 24.3 pg — ABNORMAL LOW (ref 26.0–34.0)
MCHC: 32 g/dL (ref 32.0–36.0)
MCV: 75.9 fL — ABNORMAL LOW (ref 80.0–100.0)
Monocytes Absolute: 0.6 10*3/uL (ref 0.2–0.9)
Monocytes Relative: 8 %
Neutro Abs: 5.2 10*3/uL (ref 1.4–6.5)
Neutrophils Relative %: 61 %
Platelets: 321 10*3/uL (ref 150–440)
RBC: 5.66 MIL/uL — ABNORMAL HIGH (ref 3.80–5.20)
RDW: 15.9 % — ABNORMAL HIGH (ref 11.5–14.5)
WBC: 8.4 10*3/uL (ref 3.6–11.0)

## 2015-10-02 LAB — SURGICAL PCR SCREEN
MRSA, PCR: NEGATIVE
Staphylococcus aureus: NEGATIVE

## 2015-10-02 MED ORDER — VANCOMYCIN HCL IN DEXTROSE 1-5 GM/200ML-% IV SOLN
1000.0000 mg | Freq: Once | INTRAVENOUS | Status: AC
  Administered 2015-10-02: 1000 mg via INTRAVENOUS
  Filled 2015-10-02: qty 200

## 2015-10-02 MED ORDER — CEFEPIME HCL 2 G IJ SOLR
2.0000 g | Freq: Two times a day (BID) | INTRAMUSCULAR | Status: DC
  Filled 2015-10-02: qty 2

## 2015-10-02 MED ORDER — INSULIN ASPART 100 UNIT/ML ~~LOC~~ SOLN: 0.0000 [IU] | Freq: Three times a day (TID) | SUBCUTANEOUS | Status: DC

## 2015-10-02 MED ORDER — INSULIN ASPART 100 UNIT/ML ~~LOC~~ SOLN
0.0000 [IU] | Freq: Every day | SUBCUTANEOUS | Status: DC
  Administered 2015-10-02: 5 [IU] via SUBCUTANEOUS
  Administered 2015-10-03: 3 [IU] via SUBCUTANEOUS
  Filled 2015-10-02: qty 7
  Filled 2015-10-02: qty 3
  Filled 2015-10-02: qty 5

## 2015-10-02 MED ORDER — CLOPIDOGREL BISULFATE 75 MG PO TABS
75.0000 mg | ORAL_TABLET | Freq: Every day | ORAL | Status: DC
  Administered 2015-10-02 – 2015-10-04 (×3): 75 mg via ORAL
  Filled 2015-10-02 (×3): qty 1

## 2015-10-02 MED ORDER — INSULIN GLARGINE 100 UNIT/ML ~~LOC~~ SOLN
40.0000 [IU] | Freq: Two times a day (BID) | SUBCUTANEOUS | Status: DC
  Administered 2015-10-02 – 2015-10-04 (×4): 40 [IU] via SUBCUTANEOUS
  Filled 2015-10-02 (×5): qty 0.4

## 2015-10-02 MED ORDER — HEPARIN SODIUM (PORCINE) 5000 UNIT/ML IJ SOLN
5000.0000 [IU] | Freq: Three times a day (TID) | INTRAMUSCULAR | Status: DC
  Administered 2015-10-02 – 2015-10-03 (×3): 5000 [IU] via SUBCUTANEOUS
  Filled 2015-10-02 (×3): qty 1

## 2015-10-02 MED ORDER — ONDANSETRON HCL 4 MG/2ML IJ SOLN: 4.0000 mg | Freq: Four times a day (QID) | INTRAMUSCULAR | Status: DC | PRN

## 2015-10-02 MED ORDER — INSULIN ASPART 100 UNIT/ML ~~LOC~~ SOLN
0.0000 [IU] | Freq: Three times a day (TID) | SUBCUTANEOUS | Status: DC
  Administered 2015-10-02 – 2015-10-03 (×2): 15 [IU] via SUBCUTANEOUS
  Filled 2015-10-02 (×2): qty 15

## 2015-10-02 MED ORDER — ALBUTEROL SULFATE (2.5 MG/3ML) 0.083% IN NEBU: 2.5000 mg | INHALATION_SOLUTION | RESPIRATORY_TRACT | Status: DC | PRN

## 2015-10-02 MED ORDER — ACETAMINOPHEN 650 MG RE SUPP: 650.0000 mg | Freq: Four times a day (QID) | RECTAL | Status: DC | PRN

## 2015-10-02 MED ORDER — GABAPENTIN 400 MG PO CAPS
800.0000 mg | ORAL_CAPSULE | Freq: Three times a day (TID) | ORAL | Status: DC
  Administered 2015-10-02 – 2015-10-04 (×5): 800 mg via ORAL
  Filled 2015-10-02 (×4): qty 2

## 2015-10-02 MED ORDER — ATORVASTATIN CALCIUM 20 MG PO TABS
80.0000 mg | ORAL_TABLET | Freq: Every day | ORAL | Status: DC
  Administered 2015-10-03: 80 mg via ORAL
  Filled 2015-10-02: qty 4

## 2015-10-02 MED ORDER — METOPROLOL TARTRATE 25 MG PO TABS
25.0000 mg | ORAL_TABLET | Freq: Two times a day (BID) | ORAL | Status: DC
  Administered 2015-10-02 – 2015-10-04 (×4): 25 mg via ORAL
  Filled 2015-10-02 (×5): qty 1

## 2015-10-02 MED ORDER — GABAPENTIN 800 MG PO TABS
800.0000 mg | ORAL_TABLET | Freq: Three times a day (TID) | ORAL | Status: DC
  Filled 2015-10-02 (×2): qty 1

## 2015-10-02 MED ORDER — ASPIRIN EC 81 MG PO TBEC
81.0000 mg | DELAYED_RELEASE_TABLET | Freq: Every day | ORAL | Status: DC
  Administered 2015-10-02 – 2015-10-04 (×3): 81 mg via ORAL
  Filled 2015-10-02 (×3): qty 1

## 2015-10-02 MED ORDER — NICOTINE 14 MG/24HR TD PT24
14.0000 mg | MEDICATED_PATCH | Freq: Every day | TRANSDERMAL | Status: DC
  Administered 2015-10-02 – 2015-10-03 (×2): 14 mg via TRANSDERMAL
  Filled 2015-10-02 (×2): qty 1

## 2015-10-02 MED ORDER — ONDANSETRON HCL 4 MG PO TABS
4.0000 mg | ORAL_TABLET | Freq: Four times a day (QID) | ORAL | Status: DC | PRN
Start: 2015-10-02 — End: 2015-10-04

## 2015-10-02 MED ORDER — HYDROCODONE-ACETAMINOPHEN 5-325 MG PO TABS
1.0000 | ORAL_TABLET | ORAL | Status: DC | PRN
  Administered 2015-10-02 – 2015-10-04 (×4): 1 via ORAL
  Filled 2015-10-02 (×4): qty 1

## 2015-10-02 MED ORDER — SODIUM CHLORIDE 0.9 % IV SOLN
INTRAVENOUS | Status: DC
  Administered 2015-10-02: 19:00:00 via INTRAVENOUS

## 2015-10-02 MED ORDER — MORPHINE SULFATE (PF) 4 MG/ML IV SOLN: 4.0000 mg | INTRAVENOUS | Status: DC | PRN

## 2015-10-02 MED ORDER — INSULIN GLARGINE 100 UNIT/ML ~~LOC~~ SOLN
15.0000 [IU] | Freq: Every day | SUBCUTANEOUS | Status: DC
  Filled 2015-10-02: qty 0.15

## 2015-10-02 MED ORDER — VANCOMYCIN HCL IN DEXTROSE 750-5 MG/150ML-% IV SOLN
750.0000 mg | Freq: Two times a day (BID) | INTRAVENOUS | Status: DC
  Administered 2015-10-02 – 2015-10-03 (×2): 750 mg via INTRAVENOUS
  Filled 2015-10-02 (×3): qty 150

## 2015-10-02 MED ORDER — ACETAMINOPHEN 325 MG PO TABS: 650.0000 mg | ORAL_TABLET | Freq: Four times a day (QID) | ORAL | Status: DC | PRN

## 2015-10-02 MED ORDER — LISINOPRIL 20 MG PO TABS
40.0000 mg | ORAL_TABLET | Freq: Every day | ORAL | Status: DC
  Administered 2015-10-02 – 2015-10-04 (×3): 40 mg via ORAL
  Filled 2015-10-02 (×3): qty 2

## 2015-10-02 MED ORDER — DEXTROSE 5 % IV SOLN
2.0000 g | Freq: Once | INTRAVENOUS | Status: AC
  Administered 2015-10-02: 2 g via INTRAVENOUS
  Filled 2015-10-02: qty 2

## 2015-10-02 MED ORDER — PIPERACILLIN-TAZOBACTAM 3.375 G IVPB
3.3750 g | Freq: Three times a day (TID) | INTRAVENOUS | Status: DC
  Administered 2015-10-03 – 2015-10-04 (×4): 3.375 g via INTRAVENOUS
  Filled 2015-10-02 (×6): qty 50

## 2015-10-02 MED ORDER — SODIUM CHLORIDE 0.9 % IV SOLN
Freq: Once | INTRAVENOUS | Status: AC
  Administered 2015-10-02: 17:00:00 via INTRAVENOUS

## 2015-10-02 NOTE — ED Notes (Signed)
States 2 days ago noted blister bottom of middle toe L foot, states she took a pin and drained blister herself, looked like blood and pus came out. States that decreased pain but pain returned to today.

## 2015-10-02 NOTE — H&P (Signed)
[facility] NAME: Melinda [name]    MR#:  [id number]  DATE OF BIRTH:  [birth year]  DATE OF ADMISSION:  10/02/2015  PRIMARY CARE PHYSICIAN: Elba Barman, MD   REQUESTING/REFERRING PHYSICIAN: Earleen Newport, MD  CHIEF COMPLAINT:   Chief Complaint  Patient presents with  . Toe Pain   Left 3rd toe pain for 3 days. HISTORY OF PRESENT ILLNESS:  Melinda [name]  is a 60 y.o. 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 Left 3rd toe pain for 3 days. She noticed blister on left 3rd toe yesterday. She been out of all her medications including metformin and lantus for 3 weeks. BS is 510 in ED. Xray showed early left 3rd toe osteomyelitis. She is treated with vancomycin and cefepime in ED. PAST MEDICAL HISTORY:   Past Medical History  Diagnosis Date  . HTN (hypertension)   . Tobacco abuse   . Anxiety   . Peripheral neuropathy ([place])   . Obesity   . Coronary artery disease     a. NSTEMI 06/2014; b. 4 vessel CABG 06/08/2014 LIMA to LAD, SVG to Diagonal, SVG to OM, & SVG to PDA  . Ischemic cardiomyopathy     a. echo 06/04/14 EF 40-45%, HK of entire inferolateral and inferior myocardium c.w infarct of RCA/LCx, GR2DD, mild MR  . IDDM (insulin dependent diabetes mellitus) ([place])   . GERD (gastroesophageal reflux disease)   . OA (osteoarthritis) of knee     PAST SURGICAL HISTORY:   Past Surgical History  Procedure Laterality Date  . Breast biopsy    . Toe amputation Right 12/2011    rt middle toe  . Cardiac catheterization  06/2014    95% stenosis mLAD, occlusion ostial OM1, 70% stenosis LCx, 95% stenosis mRCA, EF 45%.  . US echocardiography  06/2014    EF 50-55%, HK of inf/post/inferolat walls, Ao sclerosis  . Ct abd w & pelvis wo cm  06/2014    nl liver, gallbladder, spleen, mild diverticular changes, no bowel wall inflammation, appendix nl, no hernia, no other sig abnormalities  . Coronary artery  bypass graft N/A 06/08/2014    Procedure: CORONARY ARTERY BYPASS GRAFTING (CABG);  Surgeon: Grace Isaac, MD;  Location: [facility];  Service: Open Heart Surgery;  Laterality: N/A;  Times 4 using left internal mammary artery to LAD artery and endoscopically harvested bilateral saphenous vein to Obtuse Marginal, Diagonal and Posterior Descending coronary arteries.  Darden Dates without cardioversion N/A 06/08/2014    Procedure: TRANSESOPHAGEAL ECHOCARDIOGRAM (TEE);  Surgeon: Grace Isaac, MD;  Location: [facility];  Service: Open Heart Surgery;  Laterality: N/A;    SOCIAL HISTORY:   Social History  Substance Use Topics  . Smoking status: Current Every Day Smoker -- 0.50 packs/day for 40 years    Types: Cigarettes  . Smokeless tobacco: Never Used  . Alcohol Use: No    FAMILY HISTORY:   Family History  Problem Relation Age of Onset  . CAD Mother   . Hypertension Mother   . CAD Father     DRUG ALLERGIES:  No Known Allergies  REVIEW OF SYSTEMS:  CONSTITUTIONAL: No fever, fatigue or weakness.  EYES: No blurred or double vision.  EARS, NOSE, AND THROAT: No tinnitus or ear pain.  RESPIRATORY: No cough, shortness of breath, wheezing or hemoptysis.  CARDIOVASCULAR: No chest pain, orthopnea, edema.  GASTROINTESTINAL: No nausea, vomiting, diarrhea or abdominal  pain.  GENITOURINARY: No dysuria, hematuria.  ENDOCRINE: No polyuria, nocturia,  HEMATOLOGY: No anemia, easy bruising or bleeding SKIN: No rash or lesion. MUSCULOSKELETAL: Left 3rd toe pain.  NEUROLOGIC: No tingling, numbness, weakness.  PSYCHIATRY: No anxiety or depression.   MEDICATIONS AT HOME:   Prior to Admission medications   Medication Sig Start Date End Date Taking? Authorizing Provider  aspirin EC 81 MG tablet Take 1 tablet (81 mg total) by mouth daily. 06/24/14  Yes Ryan M Dunn, [name]  atorvastatin (LIPITOR) 80 MG tablet Take 1 tablet (80 mg total) by mouth daily at 6 PM. 04/09/15  Yes Minna Merritts, MD  clopidogrel (PLAVIX)  75 MG tablet Take 1 tablet (75 mg total) by mouth daily. 04/09/15  Yes Minna Merritts, MD  gabapentin (NEURONTIN) 800 MG tablet Take 800 mg by mouth 3 (three) times daily.   Yes Historical Provider, MD  insulin glargine (LANTUS) 100 UNIT/ML injection Inject 40 Units into the skin 2 (two) times daily.   Yes Historical Provider, MD  lisinopril (PRINIVIL,ZESTRIL) 40 MG tablet Take 1 tablet (40 mg total) by mouth daily. 04/09/15  Yes Minna Merritts, MD  metFORMIN (GLUCOPHAGE) 500 MG tablet Take 1,000 mg by mouth 2 (two) times daily. 05/04/14  Yes Historical Provider, MD  metoprolol tartrate (LOPRESSOR) 25 MG tablet Take 1 tablet (25 mg total) by mouth 2 (two) times daily. 04/09/15  Yes Minna Merritts, MD  HYDROcodone-acetaminophen (NORCO) 5-325 MG tablet Take 1 tablet by mouth every 6 (six) hours as needed for moderate pain. 09/06/15   Paulette Blanch, MD  Insulin Glargine (LANTUS) 100 UNIT/ML Solostar Pen Inject 40 Units into the skin 2 (two) times daily.  05/04/14 05/04/15  Historical Provider, MD  ondansetron (ZOFRAN ODT) 4 MG disintegrating tablet Take 1 tablet (4 mg total) by mouth every 8 (eight) hours as needed for nausea or vomiting. 09/06/15   Paulette Blanch, MD  oxyCODONE (OXY IR/ROXICODONE) 5 MG immediate release tablet Take 1-2 tablets (5-10 mg total) by mouth every 6 (six) hours as needed for severe pain. 07/13/14   Erin R Barrett, [name]      VITAL SIGNS:  Blood pressure 156/89, pulse 98, temperature 98.4 F (36.9 C), temperature source Oral, resp. rate 22, height 5\' 8"  (1.727 m), weight 250 lb (113.399 kg), SpO2 99 %.  PHYSICAL EXAMINATION:  GENERAL:  60 y.o.-year-old patient lying in the bed with no acute distress.  EYES: Pupils equal, round, reactive to light and accommodation. No scleral icterus. Extraocular muscles intact.  HEENT: Head atraumatic, normocephalic. Oropharynx and nasopharynx clear.  NECK:  Supple, no jugular venous distention. No thyroid enlargement, no tenderness.  LUNGS: Normal  breath sounds bilaterally, no wheezing, rales,rhonchi or crepitation. No use of accessory muscles of respiration.  CARDIOVASCULAR: S1, S2 normal. No murmurs, rubs, or gallops.  ABDOMEN: Soft, nontender, nondistended. Bowel sounds present. No organomegaly or mass.  EXTREMITIES: No pedal edema, cyanosis, or clubbing. Swelling, tenderness and blister on left 3rd toe. NEUROLOGIC: Cranial nerves II through XII are intact. Muscle strength 5/5 in all extremities. Sensation intact. Gait not checked.  PSYCHIATRIC: The patient is alert and oriented x 3.  SKIN: No obvious rash, lesion, or ulcer.   LABORATORY PANEL:   CBC  Recent Labs Lab 10/02/15 1315  WBC 8.4  HGB 13.7  HCT 42.9  PLT 321   ------------------------------------------------------------------------------------------------------------------  Chemistries   Recent Labs Lab 10/02/15 1315  NA 133*  K 4.8  CL 101  CO2 21*  GLUCOSE 510*  BUN 16  CREATININE 1.57*  CALCIUM 8.9  AST 18  ALT 14  ALKPHOS 133*  BILITOT 0.3   ------------------------------------------------------------------------------------------------------------------  Cardiac Enzymes No results for input(s): TROPONINI in the last 168 hours. ------------------------------------------------------------------------------------------------------------------  RADIOLOGY:  Dg Toe 3rd Left  10/02/2015  CLINICAL DATA:  Diabetes, peripheral neuropathy, left third toe infection EXAM: LEFT THIRD TOE COMPARISON:  01/19/2012 FINDINGS: Marked soft tissue swelling of the left third toe over the middle and distal phalanges. No radiopaque foreign body. There is subtle irregularity and bone loss of the left third toe distal phalanx, best appreciated on the oblique view suspicious for early osteomyelitis. No associated fracture, subluxation or dislocation. IMPRESSION: Left third toe marked soft tissue swelling with subtle bone loss and irregularity of the left third distal  phalanx, suspicious for early osteomyelitis. Electronically Signed   By: Jerilynn Mages.  Shick M.D.   On: 10/02/2015 14:41    EKG:   Orders placed or performed during the hospital encounter of 09/05/15  . EKG 12-Lead  . EKG 12-Lead  . ED EKG within 10 minutes  . ED EKG within 10 minutes  . EKG    IMPRESSION AND PLAN:   Left 3rd toe osteomyelitis Start vancomycin and zosyn, follow up Dr. Cleda Mccreedy, podiatrist.  Hyperglycemia and uncontrolled DM2. Start sliding scale, continue lantus 40 units Bid.  ARF. NS iv and follow up BMP.  HTN. Continue lisinopril and lopressor. CAD. Continue ASA and plavix.  Tobacco abuse. She was counseled for smoking cessation for 3 minutes, give nicotine patch.  All the records are reviewed and case discussed with ED provider. Management plans discussed with the patient, family and they are in agreement.  CODE STATUS: full code.  TOTAL TIME TAKING CARE OF THIS PATIENT: 53 minutes.    Demetrios Loll M.D on 10/02/2015 at 5:43 PM  Between 7am to 6pm - Pager - [phone]  After 6pm go to [email or web address] - password [email or web address] [facility]  Office  [phone]  CC: Primary care physician; Elba Barman, MD

## 2015-10-02 NOTE — ED Notes (Addendum)
Pt states she has been out of insulin for 3 weeks, states she normally takes 40 units of lantus daily

## 2015-10-02 NOTE — Progress Notes (Signed)
Pharmacy Antibiotic Note  Melinda [name] is a 60 y.o. female admitted on 10/02/2015 with osteomyelitis.  Pharmacy has been consulted for cefepime dosing.  Plan: Cefepime 2 g IV q12h  Height: 5\' 8"  (172.7 cm) Weight: 250 lb (113.399 kg) IBW/kg (Calculated) : 63.9  Temp (24hrs), Avg:98.4 F (36.9 C), Min:98.4 F (36.9 C), Max:98.4 F (36.9 C)   Recent Labs Lab 10/02/15 1315  WBC 8.4  CREATININE 1.57*    Estimated Creatinine Clearance: 49.7 mL/min (by C-G formula based on Cr of 1.57).    No Known Allergies  Antimicrobials this admission: cefepime 6/3 >>  Vancomycin 1 dose in ED 6/3  Dose adjustments this admission:  Microbiology results: None  Thank you for allowing pharmacy to be a part of this patient's care.  Lenis Noon, PharmD Clinical Pharmacist 10/02/2015 4:52 PM

## 2015-10-02 NOTE — Progress Notes (Signed)
Pharmacy Antibiotic Note  Melinda [name] is a 60 y.o. female admitted on 10/02/2015 with osteomyelitis.  Pharmacy has been consulted for vancomycin & piperacillin/tazobactam dosing.  Plan: Piperacillin/tazobactam 3.375 g IV q8h EI  Vancomycin 1000 mg IV dose given in ED. Will follow with vancomycin 750 mg IV q12h beginning at 2230 this evening (6 hour stacked dose) Vancomycin tough 15-20 mcg/mL Vancomycin trough ordered for 6/5 @ 1000 which is prior to 5th dose and should represent steady state  Patient is at risk for accumulation due to obesity.   Kinetics: Adjusted body weight 82 kg Ke: 0.044 Half-life: 16 hrs Vd: 57 L  Cmin (estimated): ~15 mcg/mL  Height: 5\' 8"  (172.7 cm) Weight: 240 lb 1.6 oz (108.909 kg) IBW/kg (Calculated) : 63.9  Temp (24hrs), Avg:98.4 F (36.9 C), Min:98.4 F (36.9 C), Max:98.4 F (36.9 C)   Recent Labs Lab 10/02/15 1315  WBC 8.4  CREATININE 1.57*    Estimated Creatinine Clearance: 48.7 mL/min (by C-G formula based on Cr of 1.57).    No Known Allergies  Antimicrobials this admission: vancomycin 6/3 >>  Piperacillin/tazobactam 6/3 >>  Cefepime one dose in ED 6.3  Dose adjustments this admission:  Microbiology results: 6/3 MRSA PCR: Negative  Thank you for allowing pharmacy to be a part of this patient's care.  Lenis Noon, PharmD Clinical Pharmacist 10/02/2015 9:24 PM

## 2015-10-02 NOTE — ED Notes (Signed)
During triage patient states she has not taken her insulin or checked her FSBS for 2 weeks because she did not have a prescription.

## 2015-10-02 NOTE — ED Notes (Signed)
EDP at bedside  

## 2015-10-02 NOTE — ED Provider Notes (Addendum)
[facility] Emergency Department Provider Note        Time seen: ----------------------------------------- 4:21 PM on 10/02/2015 -----------------------------------------    I have reviewed the triage vital signs and the nursing notes.   HISTORY  Chief Complaint Toe Pain    HPI Melinda [name] is a 60 y.o. female to ER for hypoglycemia. Patient states she been out of her insulin for 3 weeks, states she takes 40 units of Lantus daily. She also is post-metformin as well as many other medications which she has not been able to get because of scheduling problems with her doctor. She also complaining of a blister and painful area to the bottom of the left third digit. She took a pin and drain the blister herself. She states look like blood and pus came out of wound. She states the pain returned today.   Past Medical History  Diagnosis Date  . HTN (hypertension)   . Tobacco abuse   . Anxiety   . Peripheral neuropathy ([place])   . Obesity   . Coronary artery disease     a. NSTEMI 06/2014; b. 4 vessel CABG 06/08/2014 LIMA to LAD, SVG to Diagonal, SVG to OM, & SVG to PDA  . Ischemic cardiomyopathy     a. echo 06/04/14 EF 40-45%, HK of entire inferolateral and inferior myocardium c.w infarct of RCA/LCx, GR2DD, mild MR  . IDDM (insulin dependent diabetes mellitus) ([place])   . GERD (gastroesophageal reflux disease)   . OA (osteoarthritis) of knee     Patient Active Problem List   Diagnosis Date Noted  . IDDM (insulin dependent diabetes mellitus) ([place])   . Peripheral neuropathy ([place])   . Tobacco abuse   . Obesity   . Ischemic cardiomyopathy   . 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 CAD (coronary artery disease) 06/08/2014  . Coronary artery disease involving native coronary artery of native heart without angina pectoris   . HTN (hypertension)   . DM2 (diabetes mellitus, type 2) ([place])   . GERD (gastroesophageal reflux disease)   . NSTEMI (non-ST elevated myocardial  infarction) ([place]) 06/03/2014    Past Surgical History  Procedure Laterality Date  . Breast biopsy    . Toe amputation Right 12/2011    rt middle toe  . Cardiac catheterization  06/2014    95% stenosis mLAD, occlusion ostial OM1, 70% stenosis LCx, 95% stenosis mRCA, EF 45%.  . US echocardiography  06/2014    EF 50-55%, HK of inf/post/inferolat walls, Ao sclerosis  . Ct abd w & pelvis wo cm  06/2014    nl liver, gallbladder, spleen, mild diverticular changes, no bowel wall inflammation, appendix nl, no hernia, no other sig abnormalities  . Coronary artery bypass graft N/A 06/08/2014    Procedure: CORONARY ARTERY BYPASS GRAFTING (CABG);  Surgeon: Grace Isaac, MD;  Location: [facility];  Service: Open Heart Surgery;  Laterality: N/A;  Times 4 using left internal mammary artery to LAD artery and endoscopically harvested bilateral saphenous vein to Obtuse Marginal, Diagonal and Posterior Descending coronary arteries.  Darden Dates without cardioversion N/A 06/08/2014    Procedure: TRANSESOPHAGEAL ECHOCARDIOGRAM (TEE);  Surgeon: Grace Isaac, MD;  Location: [facility];  Service: Open Heart Surgery;  Laterality: N/A;    Allergies Review of patient's allergies indicates no known allergies.  Social History Social History  Substance Use Topics  . Smoking status: Current Every Day Smoker -- 0.50 packs/day for 40 years    Types: Cigarettes  . Smokeless tobacco:  Never Used  . Alcohol Use: No    Review of Systems Constitutional: Negative for fever. Eyes: Negative for visual changes. ENT: Negative for sore throat. Cardiovascular: Negative for chest pain. Respiratory: Negative for shortness of breath. Gastrointestinal: Negative for abdominal pain, vomiting and diarrhea. Genitourinary: Negative for dysuria. Musculoskeletal: Positive for left third toe pain Skin: Negative for rash. Neurological: Negative for headaches, focal weakness or numbness.  10-point ROS otherwise  negative.  ____________________________________________   PHYSICAL EXAM:  VITAL SIGNS: ED Triage Vitals  Enc Vitals Group     BP 10/02/15 1309 132/69 mmHg     Pulse Rate 10/02/15 1309 97     Resp 10/02/15 1309 18     Temp 10/02/15 1309 98.4 F (36.9 C)     Temp Source 10/02/15 1309 Oral     SpO2 10/02/15 1309 100 %     Weight 10/02/15 1309 250 lb (113.399 kg)     Height 10/02/15 1309 5\' 8"  (1.727 m)     Head Cir --      Peak Flow --      Pain Score 10/02/15 1312 7     Pain Loc --      Pain Edu? --      Excl. in [place]? --     Constitutional: Alert and oriented. Well appearing and in no distress. Eyes: Conjunctivae are normal. PERRL. Normal extraocular movements. ENT   Head: Normocephalic and atraumatic.   Nose: No congestion/rhinnorhea.   Mouth/Throat: Mucous membranes are moist.   Neck: No stridor. Cardiovascular: Normal rate, regular rhythm. No murmurs, rubs, or gallops. Respiratory: Normal respiratory effort without tachypnea nor retractions. Breath sounds are clear and equal bilaterally. No wheezes/rales/rhonchi. Gastrointestinal: Soft and nontender. Normal bowel sounds Musculoskeletal: Tenderness of the toe on the left third digit, distal darkening and swelling of the toe Neurologic:  Normal speech and language. No gross focal neurologic deficits are appreciated.  Skin:  Skin is warm, dry and intact. Skin is darkened around the left third distal interphalangeal joint Psychiatric: Mood and affect are normal. Speech and behavior are normal.  ____________________________________________  ED COURSE:  Pertinent labs & imaging results that were available during my care of the patient were reviewed by me and considered in my medical decision making (see chart for details). Patient is in no acute distress, will check basic labs, give IV fluid for glycemic control, we will assess the left third toe for infection. ____________________________________________     LABS (pertinent positives/negatives)  Labs Reviewed  CBC WITH DIFFERENTIAL/PLATELET - Abnormal; Notable for the following:    RBC 5.66 (*)    MCV 75.9 (*)    MCH 24.3 (*)    RDW 15.9 (*)    All other components within normal limits  COMPREHENSIVE METABOLIC PANEL - Abnormal; Notable for the following:    Sodium 133 (*)    CO2 21 (*)    Glucose, Bld 510 (*)    Creatinine, Ser 1.57 (*)    Alkaline Phosphatase 133 (*)    GFR calc non Af Amer 35 (*)    GFR calc Af Amer 40 (*)    All other components within normal limits    RADIOLOGY  IMPRESSION: Left third toe marked soft tissue swelling with subtle bone loss and irregularity of the left third distal phalanx, suspicious for early osteomyelitis.  ____________________________________________  FINAL ASSESSMENT AND PLAN  Hyperglycemia, possible osteomyelitis, medication noncompliance  Plan: Patient with labs and imaging as dictated above. Patient with x-ray findings concerning for  osteomyelitis. Due to her noncompliance issues and her inability to get insulin and medications as an outpatient, will be best to admit her for glycemic control. She is stable for admission at this time. We have given her vancomycin and cefepime for osteomyelitis coverage. She is received fluids for her hyperglycemia.   Earleen Newport, MD   Note: This dictation was prepared with Dragon dictation. Any transcriptional errors that result from this process are unintentional   Earleen Newport, MD 10/02/15 1703  Earleen Newport, MD 10/02/15 [phone]

## 2015-10-03 LAB — BASIC METABOLIC PANEL
Anion gap: 8 (ref 5–15)
BUN: 27 mg/dL — ABNORMAL HIGH (ref 6–20)
CO2: 25 mmol/L (ref 22–32)
Calcium: 8.7 mg/dL — ABNORMAL LOW (ref 8.9–10.3)
Chloride: 103 mmol/L (ref 101–111)
Creatinine, Ser: 1.38 mg/dL — ABNORMAL HIGH (ref 0.44–1.00)
GFR calc Af Amer: 47 mL/min — ABNORMAL LOW (ref >60)
GFR calc non Af Amer: 40 mL/min — ABNORMAL LOW (ref >60)
Glucose, Bld: 387 mg/dL — ABNORMAL HIGH (ref 65–99)
Potassium: 4.4 mmol/L (ref 3.5–5.1)
Sodium: 136 mmol/L (ref 135–145)

## 2015-10-03 LAB — GLUCOSE, CAPILLARY
Glucose-Capillary: 360 mg/dL — ABNORMAL HIGH (ref 65–99)
Glucose-Capillary: 429 mg/dL — ABNORMAL HIGH (ref 65–99)
Glucose-Capillary: 408 mg/dL — ABNORMAL HIGH (ref 65–99)
Glucose-Capillary: 222 mg/dL — ABNORMAL HIGH (ref 65–99)
Glucose-Capillary: 270 mg/dL — ABNORMAL HIGH (ref 65–99)

## 2015-10-03 LAB — HEMOGLOBIN A1C: Hgb A1c MFr Bld: 12.5 % — ABNORMAL HIGH (ref 4.0–6.0)

## 2015-10-03 LAB — CBC
HCT: 39 % (ref 35.0–47.0)
Hemoglobin: 12.8 g/dL (ref 12.0–16.0)
MCH: 24.8 pg — ABNORMAL LOW (ref 26.0–34.0)
MCHC: 32.8 g/dL (ref 32.0–36.0)
MCV: 75.6 fL — ABNORMAL LOW (ref 80.0–100.0)
Platelets: 278 10*3/uL (ref 150–440)
RBC: 5.16 MIL/uL (ref 3.80–5.20)
RDW: 16 % — ABNORMAL HIGH (ref 11.5–14.5)
WBC: 9 10*3/uL (ref 3.6–11.0)

## 2015-10-03 MED ORDER — INSULIN ASPART 100 UNIT/ML ~~LOC~~ SOLN
20.0000 [IU] | Freq: Once | SUBCUTANEOUS | Status: AC
  Administered 2015-10-03: 20 [IU] via SUBCUTANEOUS
  Filled 2015-10-03: qty 20

## 2015-10-03 MED ORDER — INSULIN ASPART 100 UNIT/ML ~~LOC~~ SOLN
0.0000 [IU] | Freq: Three times a day (TID) | SUBCUTANEOUS | Status: DC
  Administered 2015-10-03: 7 [IU] via SUBCUTANEOUS
  Administered 2015-10-04: 15 [IU] via SUBCUTANEOUS
  Administered 2015-10-04: 20 [IU] via SUBCUTANEOUS
  Filled 2015-10-03: qty 15
  Filled 2015-10-03: qty 20

## 2015-10-03 NOTE — Plan of Care (Signed)
Problem: Pain Managment: Goal: General experience of comfort will improve Outcome: Progressing Pain controlled with PRN medication

## 2015-10-03 NOTE — Progress Notes (Signed)
Pt's CBG was 429 at 1130. Recheck at 1200 was 408. Dr. Lavetta Nielsen notified. Per order will give pt 20units of novolog to cover the CBG. Per MD, will not order lab draw to further verify blood glucose.

## 2015-10-03 NOTE — Progress Notes (Signed)
[facility] [place] NAME: Melinda [name]    MRN#:  [id number]  DATE OF BIRTH:  05/01/1956  SUBJECTIVE:  Hospital Day: 1 day Melinda [name] is a 60 y.o. female presenting with Toe Pain .   Overnight events: No overnight events Interval Events: Bedside debridement performed today, no complaints at this time  REVIEW OF SYSTEMS:  CONSTITUTIONAL: No fever, fatigue or weakness.  EYES: No blurred or double vision.  EARS, NOSE, AND THROAT: No tinnitus or ear pain.  RESPIRATORY: No cough, shortness of breath, wheezing or hemoptysis.  CARDIOVASCULAR: No chest pain, orthopnea, edema.  GASTROINTESTINAL: No nausea, vomiting, diarrhea or abdominal pain.  GENITOURINARY: No dysuria, hematuria.  ENDOCRINE: No polyuria, nocturia,  HEMATOLOGY: No anemia, easy bruising or bleeding SKIN: Left third toe ulceration otherwise No rash or lesion. MUSCULOSKELETAL: No joint pain or arthritis.   NEUROLOGIC: No tingling, numbness, weakness.  PSYCHIATRY: No anxiety or depression.   DRUG ALLERGIES:  No Known Allergies  VITALS:  Blood pressure 133/72, pulse 78, temperature 98 F (36.7 C), temperature source Oral, resp. rate 18, height 5\' 8"  (1.727 m), weight 240 lb 1.6 oz (108.909 kg), SpO2 100 %.  PHYSICAL EXAMINATION:  VITAL SIGNS: Filed Vitals:   10/03/15 0435 10/03/15 0715  BP: 111/67 133/72  Pulse: 70 78  Temp: 97.7 F (36.5 C) 98 F (36.7 C)  Resp:  10   GENERAL:59 y.o.female currently in no acute distress.  HEAD: Normocephalic, atraumatic.  EYES: Pupils equal, round, reactive to light. Extraocular muscles intact. No scleral icterus.  MOUTH: Moist mucosal membrane. Dentition intact. No abscess noted.  EAR, NOSE, THROAT: Clear without exudates. No external lesions.  NECK: Supple. No thyromegaly. No nodules. No JVD.  PULMONARY: Clear to ascultation, without wheeze rails or rhonci. No use of accessory muscles, Good respiratory effort. good air  entry bilaterally CHEST: Nontender to palpation.  CARDIOVASCULAR: S1 and S2. Regular rate and rhythm. No murmurs, rubs, or gallops. No edema. Pedal pulses 2+ bilaterally.  GASTROINTESTINAL: Soft, nontender, nondistended. No masses. Positive bowel sounds. No hepatosplenomegaly.  MUSCULOSKELETAL: No swelling, clubbing, or edema. Range of motion full in all extremities.  NEUROLOGIC: Cranial nerves II through XII are intact. No gross focal neurological deficits. Sensation intact. Reflexes intact.  SKIN:  left toe dressing clean dry and intact No ulceration, lesions, rashes, or cyanosis. Skin warm and dry. Turgor intact.  PSYCHIATRIC: Mood, affect within normal limits. The patient is awake, alert and oriented x 3. Insight, judgment intact.      LABORATORY PANEL:   CBC  Recent Labs Lab 10/03/15 0407  WBC 9.0  HGB 12.8  HCT 39.0  PLT 278   ------------------------------------------------------------------------------------------------------------------  Chemistries   Recent Labs Lab 10/02/15 1315 10/03/15 0407  NA 133* 136  K 4.8 4.4  CL 101 103  CO2 21* 25  GLUCOSE 510* 387*  BUN 16 27*  CREATININE 1.57* 1.38*  CALCIUM 8.9 8.7*  AST 18  --   ALT 14  --   ALKPHOS 133*  --   BILITOT 0.3  --    ------------------------------------------------------------------------------------------------------------------  Cardiac Enzymes No results for input(s): TROPONINI in the last 168 hours. ------------------------------------------------------------------------------------------------------------------  RADIOLOGY:  Dg Toe 3rd Left  10/02/2015  CLINICAL DATA:  Diabetes, peripheral neuropathy, left third toe infection EXAM: LEFT THIRD TOE COMPARISON:  01/19/2012 FINDINGS: Marked soft tissue swelling of the left third toe over the middle and distal phalanges. No radiopaque foreign body. There is subtle irregularity and bone loss  of the left third toe distal phalanx, best appreciated on  the oblique view suspicious for early osteomyelitis. No associated fracture, subluxation or dislocation. IMPRESSION: Left third toe marked soft tissue swelling with subtle bone loss and irregularity of the left third distal phalanx, suspicious for early osteomyelitis. Electronically Signed   By: Jerilynn Mages.  Shick M.D.   On: 10/02/2015 14:41    EKG:   Orders placed or performed during the hospital encounter of 09/05/15  . EKG 12-Lead  . EKG 12-Lead  . ED EKG within 10 minutes  . ED EKG within 10 minutes  . EKG    ASSESSMENT AND PLAN:   Melinda [name] is a 60 y.o. female presenting with Toe Pain . Admitted 10/02/2015 : Day #: 1 day 1. Diabetic foot ulcer: Podiatry input appreciated, fairly superficial lesion downgrade antibiotics and anticipate discharge tomorrow morning 2. Type 2 diabetes insulin requiring poorly controlled: Increase sliding scale coverage continue basal insulin 3. Hyperlipidemia unspecified statin therapy 4. Essential hypertension: Lopressor, lisinopril  All the records are reviewed and case discussed with Care Management/Social Workerr. Management plans discussed with the patient, family and they are in agreement.  CODE STATUS: full TOTAL TIME TAKING CARE OF THIS PATIENT: 33 minutes.   POSSIBLE D/C IN 1DAYS, DEPENDING ON CLINICAL CONDITION.   Hower,  Karenann Cai.D on 10/03/2015 at 12:02 PM  Between 7am to 6pm - Pager - [phone]  After 6pm: House Pager: - [facility]  Office  [phone]  CC: Primary care physician; Elba Barman, MD

## 2015-10-03 NOTE — Consult Note (Signed)
Patient Demographics  Melinda [name], is a 60 y.o. female   MRN: [id number]   DOB - Aug 29, 1955  Admit Date - 10/02/2015    Outpatient Primary MD for the patient is WROTH, Honor Loh, MD  Consult requested in the Hospital by Lytle Butte, MD, On 10/03/2015    Reason for consult patient has swelling and redness to the tip of the third toe of the left foot. Presented to the emergency room with uncontrolled diabetes and blood sugars over 500. Has not been taking her medication for the last several weeks.   With History of -  Past Medical History  Diagnosis Date  . HTN (hypertension)   . Tobacco abuse   . Anxiety   . Peripheral neuropathy ([place])   . Obesity   . Coronary artery disease     a. NSTEMI 06/2014; b. 4 vessel CABG 06/08/2014 LIMA to LAD, SVG to Diagonal, SVG to OM, & SVG to PDA  . Ischemic cardiomyopathy     a. echo 06/04/14 EF 40-45%, HK of entire inferolateral and inferior myocardium c.w infarct of RCA/LCx, GR2DD, mild MR  . IDDM (insulin dependent diabetes mellitus) ([place])   . GERD (gastroesophageal reflux disease)   . OA (osteoarthritis) of knee   . Osteomyelitis [facility])       Past Surgical History  Procedure Laterality Date  . Breast biopsy    . Toe amputation Right 12/2011    rt middle toe  . Cardiac catheterization  06/2014    95% stenosis mLAD, occlusion ostial OM1, 70% stenosis LCx, 95% stenosis mRCA, EF 45%.  . US echocardiography  06/2014    EF 50-55%, HK of inf/post/inferolat walls, Ao sclerosis  . Ct abd w & pelvis wo cm  06/2014    nl liver, gallbladder, spleen, mild diverticular changes, no bowel wall inflammation, appendix nl, no hernia, no other sig abnormalities  . Coronary artery bypass graft N/A 06/08/2014    Procedure: CORONARY ARTERY BYPASS GRAFTING (CABG);  Surgeon: Grace Isaac, MD;  Location: [facility];   Service: Open Heart Surgery;  Laterality: N/A;  Times 4 using left internal mammary artery to LAD artery and endoscopically harvested bilateral saphenous vein to Obtuse Marginal, Diagonal and Posterior Descending coronary arteries.  Darden Dates without cardioversion N/A 06/08/2014    Procedure: TRANSESOPHAGEAL ECHOCARDIOGRAM (TEE);  Surgeon: Grace Isaac, MD;  Location: [facility];  Service: Open Heart Surgery;  Laterality: N/A;    in for   Chief Complaint  Patient presents with  . Toe Pain     HPI  Melinda [name]  is a 60 y.o. female, Patient is a diabetic female followed by Dr. Cleda Mccreedy in the past and has had an amputation to her toe on the right foot this first time she's had this type problem left foot. She is not seeing Dr. Cleda Mccreedy in quite a while. She is supposed to be seen at [facility] but apparently some issues that come about with her appointment and she has not been on any of her medications for the last several weeks and subsequently had elevated blood sugars. She presented to the hospital with increasing pain to the third toe  of the left foot    Review of Systems: She is alert and well-oriented.    In addition to the HPI above,  No Fever-chills, No Headache, No changes with Vision or hearing, No problems swallowing food or Liquids, No Chest pain, Cough or Shortness of Breath, No Abdominal pain, No Nausea or Vommitting, Bowel movements are regular, No Blood in stool or Urine, No dysuria, No new skin rashes or bruises, No new joints pains-aches, some problems with contracture toes will be addressed later. No new weakness, tingling, . Patient does have a level of peripheral neuropathy associated with her diabetes No recent weight gain or loss, patient is obese at this juncture. Some increasing polyuria over the last few weeks secondary to increased blood sugars No significant Mental Stressors. Biggest mental stressors she has at this juncture is her inability to  get an appointment at [facility].  A full 10 point Review of Systems was done, except as stated above, all other Review of Systems were negative.   Social History Social History  Substance Use Topics  . Smoking status: Current Every Day Smoker -- 0.50 packs/day for 40 years    Types: Cigarettes  . Smokeless tobacco: Never Used  . Alcohol Use: No     Family History Family History  Problem Relation Age of Onset  . CAD Mother   . Hypertension Mother   . CAD Father      Prior to Admission medications   Medication Sig Start Date End Date Taking? Authorizing Provider  aspirin EC 81 MG tablet Take 1 tablet (81 mg total) by mouth daily. 06/24/14  Yes Ryan M Dunn, [name]  atorvastatin (LIPITOR) 80 MG tablet Take 1 tablet (80 mg total) by mouth daily at 6 PM. 04/09/15  Yes Minna Merritts, MD  clopidogrel (PLAVIX) 75 MG tablet Take 1 tablet (75 mg total) by mouth daily. 04/09/15  Yes Minna Merritts, MD  gabapentin (NEURONTIN) 800 MG tablet Take 800 mg by mouth 3 (three) times daily.   Yes Historical Provider, MD  insulin glargine (LANTUS) 100 UNIT/ML injection Inject 40 Units into the skin 2 (two) times daily.   Yes Historical Provider, MD  lisinopril (PRINIVIL,ZESTRIL) 40 MG tablet Take 1 tablet (40 mg total) by mouth daily. 04/09/15  Yes Minna Merritts, MD  metFORMIN (GLUCOPHAGE) 500 MG tablet Take 1,000 mg by mouth 2 (two) times daily. 05/04/14  Yes Historical Provider, MD  metoprolol tartrate (LOPRESSOR) 25 MG tablet Take 1 tablet (25 mg total) by mouth 2 (two) times daily. 04/09/15  Yes Minna Merritts, MD  HYDROcodone-acetaminophen (NORCO) 5-325 MG tablet Take 1 tablet by mouth every 6 (six) hours as needed for moderate pain. 09/06/15   Paulette Blanch, MD  Insulin Glargine (LANTUS) 100 UNIT/ML Solostar Pen Inject 40 Units into the skin 2 (two) times daily.  05/04/14 05/04/15  Historical Provider, MD  ondansetron (ZOFRAN ODT) 4 MG disintegrating tablet Take 1 tablet (4 mg total) by mouth every 8  (eight) hours as needed for nausea or vomiting. 09/06/15   Paulette Blanch, MD  oxyCODONE (OXY IR/ROXICODONE) 5 MG immediate release tablet Take 1-2 tablets (5-10 mg total) by mouth every 6 (six) hours as needed for severe pain. 07/13/14   Lodema Hong Barrett, [name]    Anti-infectives    Start     Dose/Rate Route Frequency Ordered Stop   10/03/15 0800  ceFEPIme (MAXIPIME) 2 g in dextrose 5 % 50 mL IVPB  Status:  Discontinued  2 g 100 mL/hr over 30 Minutes Intravenous Every 12 hours 10/02/15 1816 10/02/15 2102   10/03/15 0600  piperacillin-tazobactam (ZOSYN) IVPB 3.375 g     3.375 g 12.5 mL/hr over 240 Minutes Intravenous Every 8 hours 10/02/15 2103     10/02/15 2230  vancomycin (VANCOCIN) IVPB 750 mg/150 ml premix     750 mg 150 mL/hr over 60 Minutes Intravenous Every 12 hours 10/02/15 2108     10/02/15 1715  ceFEPIme (MAXIPIME) 2 g in dextrose 5 % 50 mL IVPB     2 g 100 mL/hr over 30 Minutes Intravenous  Once 10/02/15 1645 10/02/15 1819   10/02/15 1645  vancomycin (VANCOCIN) IVPB 1000 mg/200 mL premix     1,000 mg 200 mL/hr over 60 Minutes Intravenous  Once 10/02/15 1638 10/02/15 1746      Scheduled Meds: . aspirin EC  81 mg Oral Daily  . atorvastatin  80 mg Oral q1800  . clopidogrel  75 mg Oral Daily  . gabapentin  800 mg Oral TID  . heparin  5,000 Units Subcutaneous Q8H  . insulin aspart  0-15 Units Subcutaneous TID WC  . insulin aspart  0-5 Units Subcutaneous QHS  . insulin glargine  40 Units Subcutaneous BID  . lisinopril  40 mg Oral Daily  . metoprolol tartrate  25 mg Oral BID  . nicotine  14 mg Transdermal Daily  . piperacillin-tazobactam (ZOSYN)  IV  3.375 g Intravenous Q8H  . vancomycin  750 mg Intravenous Q12H   Continuous Infusions: . sodium chloride 75 mL/hr at 10/02/15 1842   PRN Meds:.acetaminophen **OR** acetaminophen, albuterol, HYDROcodone-acetaminophen, morphine injection, ondansetron **OR** ondansetron (ZOFRAN) IV  No Known Allergies  Physical  Exam  Vitals  Blood pressure 133/72, pulse 78, temperature 98 F (36.7 C), temperature source Oral, resp. rate 18, height 5\' 8"  (1.727 m), weight 108.909 kg (240 lb 1.6 oz), SpO2 100 %.  Lower Extremity exam:  Vascular:DP and PT pulses are palpable bilaterally.  Dermatological: Patient has a hyperkeratotic buildup to the distal tip of the third toe left foot there is evidence of fluid underneath his hyperkeratosis and is likely associated with diabetic ulceration and neuropathic ulceration to the region. There is no erythema or drainage from the region about this timeframe. A 15 blade was used to debride the hyperkeratosis and ulcer was present which is about 4 mm in diameter but is superficial with a good granular base no tracking or deep penetration other than to the dermis. Patient also has thickened mycotic toenails TA through T9.  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  Data Review  CBC  Recent Labs Lab 10/02/15 1315 10/03/15 0407  WBC 8.4 9.0  HGB 13.7 12.8  HCT 42.9 39.0  PLT 321 278  MCV 75.9* 75.6*  MCH 24.3* 24.8*  MCHC 32.0 32.8  RDW 15.9* 16.0*  LYMPHSABS 2.1  --   MONOABS 0.6  --   EOSABS 0.4  --   BASOSABS 0.1  --    ------------------------------------------------------------------------------------------------------------------  Chemistries   Recent Labs Lab 10/02/15 1315 10/03/15 0407  NA 133* 136  K 4.8 4.4  CL 101 103  CO2 21* 25  GLUCOSE 510* 387*  BUN 16 27*  CREATININE 1.57* 1.38*  CALCIUM 8.9 8.7*  AST 18  --  ALT 14  --   ALKPHOS 133*  --   BILITOT 0.3  --    -------  ---------------------------------------------------------------------------------------------------------------  Urinalysis    Component Value Date/Time   COLORURINE  YELLOW* 09/06/2015 0401   COLORURINE Yellow 06/01/2014 2258   APPEARANCEUR CLEAR* 09/06/2015 0401   APPEARANCEUR Hazy 06/01/2014 2258   LABSPEC 1.018 09/06/2015 0401   LABSPEC 1.031 06/01/2014 2258   PHURINE 7.0 09/06/2015 0401   PHURINE 5.0 06/01/2014 2258   GLUCOSEU 150* 09/06/2015 0401   GLUCOSEU 50 mg/dL 06/01/2014 2258   HGBUR 1+* 09/06/2015 0401   HGBUR 1+ 06/01/2014 2258   BILIRUBINUR NEGATIVE 09/06/2015 0401   BILIRUBINUR Negative 06/01/2014 2258   KETONESUR NEGATIVE 09/06/2015 0401   KETONESUR Negative 06/01/2014 2258   PROTEINUR 100* 09/06/2015 0401   PROTEINUR 30 mg/dL 06/01/2014 2258   UROBILINOGEN 1.0 06/07/2014 0952   NITRITE NEGATIVE 09/06/2015 0401   NITRITE Negative 06/01/2014 2258   LEUKOCYTESUR NEGATIVE 09/06/2015 0401   LEUKOCYTESUR Negative 06/01/2014 2258     Imaging results:   Dg Toe 3rd Left  10/02/2015  CLINICAL DATA:  Diabetes, peripheral neuropathy, left third toe infection EXAM: LEFT THIRD TOE COMPARISON:  01/19/2012 FINDINGS: Marked soft tissue swelling of the left third toe over the middle and distal phalanges. No radiopaque foreign body. There is subtle irregularity and bone loss of the left third toe distal phalanx, best appreciated on the oblique view suspicious for early osteomyelitis. No associated fracture, subluxation or dislocation. IMPRESSION: Left third toe marked soft tissue swelling with subtle bone loss and irregularity of the left third distal phalanx, suspicious for early osteomyelitis. Electronically Signed   By: Jerilynn Mages.  Shick M.D.   On: 10/02/2015 14:41     Assessment & Plan: Basically the toe looks pretty stable at this juncture. The ulceration is fairly superficial there was a lot of hyperkeratotic buildup around the region but it was superficial. There is no tracking to deeper tissues the ulceration is limited only to the dermis. X-rays were questionable for possible osteomyelitis to the tip of the toe but clinical symptoms do not  necessarily correlate with that. There is a possibility that that could be the case chronically but currently there is no ulceration that would lead to that level of bone. There is no real redness or swelling to the toe at this timeframe. Apparently was more swollen yesterday but she appears to be responding to antibiotics and offloading of the toe. Plan: Excisional debridement of the toe at bedside utilizing a 15 scalpel blade to remove hyperkeratotic and devitalized tissue from the central and peripheral portion of the wound. Wound measures about 4 mm in diameter with a superficial appearance involving only epidermis and dermis. No spreading cellulitis no sinus tracts or purulent drainage. I will apply a heavy gauze dressing to this area today along with a postoperative shoe. She should be able to be discharged today or tomorrow with an oral antibiotic and follow up with Dr. Sharlotte Alamo and I office later this week or next week.  Principal Problem:   Toe osteomyelitis, left (HCC) Active Problems:   Hyperglycemia     Family Communication: Plan discussed with patient. Thank you for the consult, we will follow the patient with you in the Hospital.      Perry Mount M.D on 10/03/2015 at 9:22 AM

## 2015-10-04 LAB — GLUCOSE, CAPILLARY
Glucose-Capillary: 333 mg/dL — ABNORMAL HIGH (ref 65–99)
Glucose-Capillary: 377 mg/dL — ABNORMAL HIGH (ref 65–99)

## 2015-10-04 MED ORDER — ASPIRIN 81 MG PO TBEC: 81.0000 mg | DELAYED_RELEASE_TABLET | Freq: Every day | ORAL | Status: DC

## 2015-10-04 MED ORDER — ALBUTEROL SULFATE (2.5 MG/3ML) 0.083% IN NEBU: 2.5000 mg | INHALATION_SOLUTION | RESPIRATORY_TRACT | Status: DC | PRN

## 2015-10-04 MED ORDER — HYDROCODONE-ACETAMINOPHEN 5-325 MG PO TABS: 1.0000 | ORAL_TABLET | ORAL | Status: DC | PRN

## 2015-10-04 MED ORDER — INSULIN ASPART 100 UNIT/ML ~~LOC~~ SOLN
SUBCUTANEOUS | Status: DC
Start: 2015-10-04 — End: 2015-10-22

## 2015-10-04 MED ORDER — CLOPIDOGREL BISULFATE 75 MG PO TABS: 75.0000 mg | ORAL_TABLET | Freq: Every day | ORAL | Status: DC

## 2015-10-04 MED ORDER — METOPROLOL TARTRATE 25 MG PO TABS: 25.0000 mg | ORAL_TABLET | Freq: Two times a day (BID) | ORAL | Status: DC

## 2015-10-04 MED ORDER — LISINOPRIL 40 MG PO TABS: 40.0000 mg | ORAL_TABLET | Freq: Every day | ORAL | Status: DC

## 2015-10-04 MED ORDER — ATORVASTATIN CALCIUM 80 MG PO TABS: 80.0000 mg | ORAL_TABLET | Freq: Every day | ORAL | Status: DC

## 2015-10-04 MED ORDER — GABAPENTIN 800 MG PO TABS: 800.0000 mg | ORAL_TABLET | Freq: Three times a day (TID) | ORAL | Status: DC

## 2015-10-04 MED ORDER — INSULIN ASPART 100 UNIT/ML ~~LOC~~ SOLN: SUBCUTANEOUS | Status: DC

## 2015-10-04 MED ORDER — CLINDAMYCIN HCL 300 MG PO CAPS
300.0000 mg | ORAL_CAPSULE | Freq: Three times a day (TID) | ORAL | Status: DC
Start: 2015-10-04 — End: 2015-10-22

## 2015-10-04 MED ORDER — INSULIN GLARGINE 100 UNIT/ML ~~LOC~~ SOLN
40.0000 [IU] | Freq: Two times a day (BID) | SUBCUTANEOUS | Status: DC
Start: 2015-10-04 — End: 2016-10-10

## 2015-10-04 MED ORDER — METFORMIN HCL 500 MG PO TABS: 1000.0000 mg | ORAL_TABLET | Freq: Two times a day (BID) | ORAL | Status: DC

## 2015-10-04 NOTE — Progress Notes (Signed)
Inpatient Diabetes Program Recommendations  AACE/ADA: New Consensus Statement on Inpatient Glycemic Control (2015)  Target Ranges:  Prepandial:   less than 140 mg/dL      Peak postprandial:   less than 180 mg/dL (1-2 hours)      Critically ill patients:  140 - 180 mg/dL   Lab Results  Component Value Date   GLUCAP 333* 10/04/2015   HGBA1C 12.5* 10/03/2015    Review of Glycemic Control:  Results for Melinda [name], Melinda [name] (MRN [id number]) as of 10/04/2015 09:46  Ref. Range 10/03/2015 11:28 10/03/2015 12:08 10/03/2015 16:53 10/03/2015 21:38 10/04/2015 07:30  Glucose-Capillary Latest Ref Range: 65-99 mg/dL 429 (H) 408 (H) 222 (H) 270 (H) 333 (H)    Diabetes history: Type 2 diabetes Outpatient Diabetes medications: Lantus 40 units bid, Metformin 1000 mg bid Current orders for Inpatient glycemic control:  Lantus 40 units bid, Novolog resistant tid with meals and HS  Inpatient Diabetes Program Recommendations:    A1C continues to be in the 12% range.  May consider endocrine consult for improved glucose/diabetes control.  Also may consider adding Novolog meal coverage 10 units tid with meals (hold if patient eats less than 50%).  Currently patient's home insulin regimen is only providing basal insulin.  It appears that she needs meal coverage/rapid acting insulin as well.  Thanks, Adah Perl, RN, BC-ADM Inpatient Diabetes Coordinator Pager [phone] (8a-5p)

## 2015-10-04 NOTE — Progress Notes (Signed)
Pt had requested earlier that I ask the Dr if he would discontinue metformin and order novolog for discharge. Dr Lavetta Nielsen notified and did this.  All of rx's given to the pt. Pt pushed out in wheelchair to her car at the ER entrance

## 2015-10-04 NOTE — Progress Notes (Signed)
Patient Demographics  Melinda [name], is a 60 y.o. female   MRN: [id number]   DOB - 01/28/1956  Admit Date - 10/02/2015    Outpatient Primary MD for the patient is WROTH, Honor Loh, MD  Consult requested in the Hospital by Lytle Butte, MD, On 10/04/2015    With History of -  Past Medical History  Diagnosis Date  . HTN (hypertension)   . Tobacco abuse   . Anxiety   . Peripheral neuropathy ([place])   . Obesity   . Coronary artery disease     a. NSTEMI 06/2014; b. 4 vessel CABG 06/08/2014 LIMA to LAD, SVG to Diagonal, SVG to OM, & SVG to PDA  . Ischemic cardiomyopathy     a. echo 06/04/14 EF 40-45%, HK of entire inferolateral and inferior myocardium c.w infarct of RCA/LCx, GR2DD, mild MR  . IDDM (insulin dependent diabetes mellitus) ([place])   . GERD (gastroesophageal reflux disease)   . OA (osteoarthritis) of knee   . Osteomyelitis [facility])       Past Surgical History  Procedure Laterality Date  . Breast biopsy    . Toe amputation Right 12/2011    rt middle toe  . Cardiac catheterization  06/2014    95% stenosis mLAD, occlusion ostial OM1, 70% stenosis LCx, 95% stenosis mRCA, EF 45%.  . US echocardiography  06/2014    EF 50-55%, HK of inf/post/inferolat walls, Ao sclerosis  . Ct abd w & pelvis wo cm  06/2014    nl liver, gallbladder, spleen, mild diverticular changes, no bowel wall inflammation, appendix nl, no hernia, no other sig abnormalities  . Coronary artery bypass graft N/A 06/08/2014    Procedure: CORONARY ARTERY BYPASS GRAFTING (CABG);  Surgeon: Grace Isaac, MD;  Location: [facility];  Service: Open Heart Surgery;  Laterality: N/A;  Times 4 using left internal mammary artery to LAD artery and endoscopically harvested bilateral saphenous vein to Obtuse Marginal, Diagonal and Posterior Descending coronary arteries.  Darden Dates  without cardioversion N/A 06/08/2014    Procedure: TRANSESOPHAGEAL ECHOCARDIOGRAM (TEE);  Surgeon: Grace Isaac, MD;  Location: [facility];  Service: Open Heart Surgery;  Laterality: N/A;    in for   Chief Complaint  Patient presents with  . Toe Pain     HPI  [name]  is a 60 y.o. female, Ulcer to the third toe left foot and uncontrolled diabetes   Social History Social History  Substance Use Topics  . Smoking status: Current Every Day Smoker -- 0.50 packs/day for 40 years    Types: Cigarettes  . Smokeless tobacco: Never Used  . Alcohol Use: No     Family History Family History  Problem Relation Age of Onset  . CAD Mother   . Hypertension Mother   . CAD Father      Prior to Admission medications   Medication Sig Start Date End Date Taking? Authorizing Provider  aspirin EC 81 MG tablet Take 1 tablet (81 mg total) by mouth daily. 06/24/14  Yes Ryan M Dunn, [name]  atorvastatin (LIPITOR) 80 MG tablet Take 1 tablet (80 mg total) by mouth daily at 6 PM. 04/09/15  Yes Minna Merritts,  MD  clopidogrel (PLAVIX) 75 MG tablet Take 1 tablet (75 mg total) by mouth daily. 04/09/15  Yes Minna Merritts, MD  gabapentin (NEURONTIN) 800 MG tablet Take 800 mg by mouth 3 (three) times daily.   Yes Historical Provider, MD  insulin glargine (LANTUS) 100 UNIT/ML injection Inject 40 Units into the skin 2 (two) times daily.   Yes Historical Provider, MD  lisinopril (PRINIVIL,ZESTRIL) 40 MG tablet Take 1 tablet (40 mg total) by mouth daily. 04/09/15  Yes Minna Merritts, MD  metFORMIN (GLUCOPHAGE) 500 MG tablet Take 1,000 mg by mouth 2 (two) times daily. 05/04/14  Yes Historical Provider, MD  metoprolol tartrate (LOPRESSOR) 25 MG tablet Take 1 tablet (25 mg total) by mouth 2 (two) times daily. 04/09/15  Yes Minna Merritts, MD  HYDROcodone-acetaminophen (NORCO) 5-325 MG tablet Take 1 tablet by mouth every 6 (six) hours as needed for moderate pain. 09/06/15   Paulette Blanch, MD  Insulin Glargine  (LANTUS) 100 UNIT/ML Solostar Pen Inject 40 Units into the skin 2 (two) times daily.  05/04/14 05/04/15  Historical Provider, MD  ondansetron (ZOFRAN ODT) 4 MG disintegrating tablet Take 1 tablet (4 mg total) by mouth every 8 (eight) hours as needed for nausea or vomiting. 09/06/15   Paulette Blanch, MD  oxyCODONE (OXY IR/ROXICODONE) 5 MG immediate release tablet Take 1-2 tablets (5-10 mg total) by mouth every 6 (six) hours as needed for severe pain. 07/13/14   Freddrick March, [name]    Anti-infectives    Start     Dose/Rate Route Frequency Ordered Stop   10/03/15 0800  ceFEPIme (MAXIPIME) 2 g in dextrose 5 % 50 mL IVPB  Status:  Discontinued     2 g 100 mL/hr over 30 Minutes Intravenous Every 12 hours 10/02/15 1816 10/02/15 2102   10/03/15 0600  piperacillin-tazobactam (ZOSYN) IVPB 3.375 g     3.375 g 12.5 mL/hr over 240 Minutes Intravenous Every 8 hours 10/02/15 2103     10/02/15 2230  vancomycin (VANCOCIN) IVPB 750 mg/150 ml premix  Status:  Discontinued     750 mg 150 mL/hr over 60 Minutes Intravenous Every 12 hours 10/02/15 2108 10/03/15 1200   10/02/15 1715  ceFEPIme (MAXIPIME) 2 g in dextrose 5 % 50 mL IVPB     2 g 100 mL/hr over 30 Minutes Intravenous  Once 10/02/15 1645 10/02/15 1819   10/02/15 1645  vancomycin (VANCOCIN) IVPB 1000 mg/200 mL premix     1,000 mg 200 mL/hr over 60 Minutes Intravenous  Once 10/02/15 1638 10/02/15 1746      Scheduled Meds: . aspirin EC  81 mg Oral Daily  . atorvastatin  80 mg Oral q1800  . clopidogrel  75 mg Oral Daily  . gabapentin  800 mg Oral TID  . heparin  5,000 Units Subcutaneous Q8H  . insulin aspart  0-20 Units Subcutaneous TID WC  . insulin aspart  0-5 Units Subcutaneous QHS  . insulin glargine  40 Units Subcutaneous BID  . lisinopril  40 mg Oral Daily  . metoprolol tartrate  25 mg Oral BID  . nicotine  14 mg Transdermal Daily  . piperacillin-tazobactam (ZOSYN)  IV  3.375 g Intravenous Q8H  No Known Allergies  Physical Exam  Vitals  Blood  pressure 113/55, pulse 72, temperature 98.3 F (36.8 C), temperature source Oral, resp. rate 18, height 5\' 8"  (1.727 m), weight 108.909 kg (240 lb 1.6 oz), SpO2 99 %.  Lower Extremity exam:  Vascular:+1 over 4  bilateral  Dermatological: Patient has stable ulceration of the distal tip of the third toe it's only a few millimeters in diameter with no penetration or deep tracking. There is no redness or cellulitis or drainage to the area to this timeframe. The entire distal portion of the toe looks fairly stable at this juncture.  Neurological: Peripheral neuropathy bilateral secondary to diabetes mellitus  Ortho: Question as to whether or not there is osteomyelitis in the distal phalanx of the third toe. On review the x-ray there is a little bit of cortical change of the distal tip this could be from previous trauma or stress. Her clinical symptoms did not match with osteomyelitis at this point.  Data Review  CBC  Recent Labs Lab 10/02/15 1315 10/03/15 0407  WBC 8.4 9.0  HGB 13.7 12.8  HCT 42.9 39.0  PLT 321 278  MCV 75.9* 75.6*  MCH 24.3* 24.8*  MCHC 32.0 32.8  RDW 15.9* 16.0*  LYMPHSABS 2.1  --   MONOABS 0.6  --   EOSABS 0.4  --   BASOSABS 0.1  --    ------------------------------------------------------------------------------------------------------------------  Chemistries   Recent Labs Lab 10/02/15 1315 10/03/15 0407  NA 133* 136  K 4.8 4.4  CL 101 103  CO2 21* 25  GLUCOSE 510* 387*  BUN 16 27*  CREATININE 1.57* 1.38*  CALCIUM 8.9 8.7*  AST 18  --   ALT 14  --   ALKPHOS 133*  --   BILITOT 0.3  --     Imaging results:   Dg Toe 3rd Left  10/02/2015  CLINICAL DATA:  Diabetes, peripheral neuropathy, left third toe infection EXAM: LEFT THIRD TOE COMPARISON:  01/19/2012 FINDINGS: Marked soft tissue swelling of the left third toe over the middle and distal phalanges. No radiopaque foreign body. There is subtle irregularity and bone loss of the left third toe  distal phalanx, best appreciated on the oblique view suspicious for early osteomyelitis. No associated fracture, subluxation or dislocation. IMPRESSION: Left third toe marked soft tissue swelling with subtle bone loss and irregularity of the left third distal phalanx, suspicious for early osteomyelitis. Electronically Signed   By: Jerilynn Mages.  Shick M.D.   On: 10/02/2015 14:41  Assessment & Plan: Improved stable ulceration distal tip of the third toe left foot. Plan: Apply heavy padded dressing today using postoperative shoe for ambulation an appointment to see Dr. Sharlotte Alamo on Thursday for follow-up.  Perry Mount M.D on 10/04/2015 at 7:58 AM

## 2015-10-04 NOTE — Discharge Summary (Signed)
[facility] [place] NAME: Melinda [name]    MR#:  [id number]  DATE OF BIRTH:  10/12/1955  DATE OF ADMISSION:  10/02/2015 ADMITTING PHYSICIAN: Demetrios Loll, MD  DATE OF DISCHARGE: 10/04/2015  PRIMARY CARE PHYSICIAN: Elba Barman, MD    ADMISSION DIAGNOSIS:  Hyperglycemia [R73.9] Acute osteomyelitis of left foot ([place]) [M86.172]  DISCHARGE DIAGNOSIS:  Left toe cellulitis Poorly controlled type 2 diabetes insulin requiring  SECONDARY DIAGNOSIS:   Past Medical History  Diagnosis Date  . HTN (hypertension)   . Tobacco abuse   . Anxiety   . Peripheral neuropathy ([place])   . Obesity   . Coronary artery disease     a. NSTEMI 06/2014; b. 4 vessel CABG 06/08/2014 LIMA to LAD, SVG to Diagonal, SVG to OM, & SVG to PDA  . Ischemic cardiomyopathy     a. echo 06/04/14 EF 40-45%, HK of entire inferolateral and inferior myocardium c.w infarct of RCA/LCx, GR2DD, mild MR  . IDDM (insulin dependent diabetes mellitus) ([place])   . GERD (gastroesophageal reflux disease)   . OA (osteoarthritis) of knee   . Osteomyelitis [facility])     HOSPITAL COURSE:  Melinda [name]  is a 60 y.o. female admitted 10/02/2015 with chief complaint Toe Pain . Please see H&P performed by Demetrios Loll, MD for further information. Patient presented with the above symptoms. There was concern for osteomyelitis on admission which was ruled out. She was evaluated by podiatry who did localized debridement. She can be discharged with oral antibiotics and outpatient follow up  DISCHARGE CONDITIONS:   stable  CONSULTS OBTAINED:  Treatment Team:  Sharlotte Alamo, DPM Albertine Patricia, DPM  DRUG ALLERGIES:  No Known Allergies  DISCHARGE MEDICATIONS:   Current Discharge Medication List    START taking these medications   Details  clindamycin (CLEOCIN) 300 MG capsule Take 1 capsule (300 mg total) by mouth 3 (three) times daily. Qty: 15 capsule, Refills: 0    !! HYDROcodone-acetaminophen  (NORCO/VICODIN) 5-325 MG tablet Take 1-2 tablets by mouth every 4 (four) hours as needed for moderate pain. Qty: 30 tablet, Refills: 0     !! - Potential duplicate medications found. Please discuss with provider.    CONTINUE these medications which have NOT CHANGED   Details  aspirin EC 81 MG tablet Take 1 tablet (81 mg total) by mouth daily. Qty: 90 tablet, Refills: 3   Associated Diagnoses: Coronary artery disease involving native coronary artery of native heart without angina pectoris    atorvastatin (LIPITOR) 80 MG tablet Take 1 tablet (80 mg total) by mouth daily at 6 PM. Qty: 30 tablet, Refills: 11    clopidogrel (PLAVIX) 75 MG tablet Take 1 tablet (75 mg total) by mouth daily. Qty: 30 tablet, Refills: 11   Associated Diagnoses: Coronary artery disease involving native coronary artery of native heart without angina pectoris    gabapentin (NEURONTIN) 800 MG tablet Take 800 mg by mouth 3 (three) times daily.    insulin glargine (LANTUS) 100 UNIT/ML injection Inject 40 Units into the skin 2 (two) times daily.    lisinopril (PRINIVIL,ZESTRIL) 40 MG tablet Take 1 tablet (40 mg total) by mouth daily. Qty: 30 tablet, Refills: 11    metFORMIN (GLUCOPHAGE) 500 MG tablet Take 1,000 mg by mouth 2 (two) times daily.    metoprolol tartrate (LOPRESSOR) 25 MG tablet Take 1 tablet (25 mg total) by mouth 2 (two) times daily. Qty: 60 tablet, Refills: 11    !!  HYDROcodone-acetaminophen (NORCO) 5-325 MG tablet Take 1 tablet by mouth every 6 (six) hours as needed for moderate pain. Qty: 15 tablet, Refills: 0    ondansetron (ZOFRAN ODT) 4 MG disintegrating tablet Take 1 tablet (4 mg total) by mouth every 8 (eight) hours as needed for nausea or vomiting. Qty: 15 tablet, Refills: 0    oxyCODONE (OXY IR/ROXICODONE) 5 MG immediate release tablet Take 1-2 tablets (5-10 mg total) by mouth every 6 (six) hours as needed for severe pain. Qty: 20 tablet, Refills: 0     !! - Potential duplicate  medications found. Please discuss with provider.    STOP taking these medications     Insulin Glargine (LANTUS) 100 UNIT/ML Solostar Pen          DISCHARGE INSTRUCTIONS:    DIET:  Diabetic diet  DISCHARGE CONDITION:  Stable  ACTIVITY:  Activity as tolerated  OXYGEN:  Home Oxygen: No.   Oxygen Delivery: room air  DISCHARGE LOCATION:  home   If you experience worsening of your admission symptoms, develop shortness of breath, life threatening emergency, suicidal or homicidal thoughts you must seek medical attention immediately by calling 911 or calling your MD immediately  if symptoms less severe.  You Must read complete instructions/literature along with all the possible adverse reactions/side effects for all the Medicines you take and that have been prescribed to you. Take any new Medicines after you have completely understood and accpet all the possible adverse reactions/side effects.   Please note  You were cared for by a hospitalist during your hospital stay. If you have any questions about your discharge medications or the care you received while you were in the hospital after you are discharged, you can call the unit and asked to speak with the hospitalist on call if the hospitalist that took care of you is not available. Once you are discharged, your primary care physician will handle any further medical issues. Please note that NO REFILLS for any discharge medications will be authorized once you are discharged, as it is imperative that you return to your primary care physician (or establish a relationship with a primary care physician if you do not have one) for your aftercare needs so that they can reassess your need for medications and monitor your lab values.    On the day of Discharge:   VITAL SIGNS:  Blood pressure 113/55, pulse 72, temperature 98.3 F (36.8 C), temperature source Oral, resp. rate 18, height 5\' 8"  (1.727 m), weight 240 lb 1.6 oz (108.909 kg),  SpO2 99 %.  I/O:   Intake/Output Summary (Last 24 hours) at 10/04/15 1002 Last data filed at 10/03/15 1900  Gross per 24 hour  Intake    580 ml  Output      0 ml  Net    580 ml    PHYSICAL EXAMINATION:  GENERAL:  60 y.o.-year-old patient lying in the bed with no acute distress.  EYES: Pupils equal, round, reactive to light and accommodation. No scleral icterus. Extraocular muscles intact.  HEENT: Head atraumatic, normocephalic. Oropharynx and nasopharynx clear.  NECK:  Supple, no jugular venous distention. No thyroid enlargement, no tenderness.  LUNGS: Normal breath sounds bilaterally, no wheezing, rales,rhonchi or crepitation. No use of accessory muscles of respiration.  CARDIOVASCULAR: S1, S2 normal. No murmurs, rubs, or gallops.  ABDOMEN: Soft, non-tender, non-distended. Bowel sounds present. No organomegaly or mass.  EXTREMITIES: No pedal edema, cyanosis, or clubbing.  NEUROLOGIC: Cranial nerves II through XII are intact. Muscle strength  5/5 in all extremities. Sensation intact. Gait not checked.  PSYCHIATRIC: The patient is alert and oriented x 3.  SKIN: left foot dressing clean dry intact otherwise No obvious rash, lesion, or ulcer.   DATA REVIEW:   CBC  Recent Labs Lab 10/03/15 0407  WBC 9.0  HGB 12.8  HCT 39.0  PLT 278    Chemistries   Recent Labs Lab 10/02/15 1315 10/03/15 0407  NA 133* 136  K 4.8 4.4  CL 101 103  CO2 21* 25  GLUCOSE 510* 387*  BUN 16 27*  CREATININE 1.57* 1.38*  CALCIUM 8.9 8.7*  AST 18  --   ALT 14  --   ALKPHOS 133*  --   BILITOT 0.3  --     Cardiac Enzymes No results for input(s): TROPONINI in the last 168 hours.  Microbiology Results  Results for orders placed or performed during the hospital encounter of 10/02/15  Surgical pcr screen     Status: None   Collection Time: 10/02/15  6:32 PM  Result Value Ref Range Status   MRSA, PCR NEGATIVE NEGATIVE Final   Staphylococcus aureus NEGATIVE NEGATIVE Final    Comment:          The Xpert SA Assay (FDA approved for NASAL specimens in patients over 33 years of age), is one component of a comprehensive surveillance program.  Test performance has been validated by [facility] for patients greater than or equal to 86 year old. It is not intended to diagnose infection nor to guide or monitor treatment.     RADIOLOGY:  Dg Toe 3rd Left  10/02/2015  CLINICAL DATA:  Diabetes, peripheral neuropathy, left third toe infection EXAM: LEFT THIRD TOE COMPARISON:  01/19/2012 FINDINGS: Marked soft tissue swelling of the left third toe over the middle and distal phalanges. No radiopaque foreign body. There is subtle irregularity and bone loss of the left third toe distal phalanx, best appreciated on the oblique view suspicious for early osteomyelitis. No associated fracture, subluxation or dislocation. IMPRESSION: Left third toe marked soft tissue swelling with subtle bone loss and irregularity of the left third distal phalanx, suspicious for early osteomyelitis. Electronically Signed   By: Jerilynn Mages.  Shick M.D.   On: 10/02/2015 14:41     Management plans discussed with the patient, family and they are in agreement.  CODE STATUS:     Code Status Orders        Start     Ordered   10/02/15 1817  Full code   Continuous     10/02/15 1816    Code Status History    Date Active Date Inactive Code Status Order ID Comments User Context   06/08/2014 12:58 PM 06/13/2014  6:54 PM Full Code [id number]  Nani Skillern, [name] Inpatient   06/03/2014  5:18 PM 06/08/2014 12:58 PM Full Code [id number]  Rise Mu, [name] Inpatient      TOTAL TIME TAKING CARE OF THIS PATIENT: 28 minutes.    Hower,  Karenann Cai.D on 10/04/2015 at 10:02 AM  Between 7am to 6pm - Pager - [phone]  After 6pm go to [email or web address] - [employer] [facility]  Office  [phone]  CC: Primary care physician; Elba Barman, MD

## 2015-10-04 NOTE — Care Management Note (Signed)
Case Management Note  Patient Details  Name: Melinda [name] MRN: [id number] Date of Birth: 1955-05-06  Subjective/Objective:     Discharge to home today. Has Medicaid and will be able to obtain all prescriptions from her pharmacy with a $3.00 co-pay each. .                Action/Plan:   Expected Discharge Date:                  Expected Discharge Plan:     In-House Referral:     Discharge planning Services     Post Acute Care Choice:    Choice offered to:     DME Arranged:    DME Agency:     HH Arranged:    [place] Agency:     Status of Service:     Medicare Important Message Given:    Date Medicare IM Given:    Medicare IM give by:    Date Additional Medicare IM Given:    Additional Medicare Important Message give by:     If discussed at [place] of Stay Meetings, dates discussed:    Additional Comments:  Rayaan Lorah A, RN 10/04/2015, 11:09 AM

## 2015-10-19 ENCOUNTER — Encounter: Payer: Self-pay | Admitting: *Deleted

## 2015-10-19 ENCOUNTER — Ambulatory Visit: Payer: Medicaid Other | Admitting: Cardiovascular Disease

## 2015-10-21 ENCOUNTER — Inpatient Hospital Stay: Payer: Medicaid Other | Admitting: Anesthesiology

## 2015-10-21 ENCOUNTER — Encounter
Admission: AD | Disposition: A | Discharge status: Home / Self Care | Payer: Self-pay | Source: Ambulatory Visit | Attending: Internal Medicine

## 2015-10-21 ENCOUNTER — Inpatient Hospital Stay
Admission: AD | Admit: 2015-10-21 | Discharge: 2015-10-22 | DRG: 617 | Disposition: A | Discharge status: Home / Self Care | Payer: Medicaid Other | Source: Ambulatory Visit | Attending: Internal Medicine | Admitting: Internal Medicine

## 2015-10-21 DIAGNOSIS — Z794 Long term (current) use of insulin: Secondary | ICD-10-CM

## 2015-10-21 DIAGNOSIS — Z8249 Family history of ischemic heart disease and other diseases of the circulatory system: Secondary | ICD-10-CM

## 2015-10-21 DIAGNOSIS — M179 Osteoarthritis of knee, unspecified: Secondary | ICD-10-CM | POA: Diagnosis present

## 2015-10-21 DIAGNOSIS — Z79899 Other long term (current) drug therapy: Secondary | ICD-10-CM | POA: Diagnosis not present

## 2015-10-21 DIAGNOSIS — I10 Essential (primary) hypertension: Secondary | ICD-10-CM | POA: Diagnosis present

## 2015-10-21 DIAGNOSIS — K219 Gastro-esophageal reflux disease without esophagitis: Secondary | ICD-10-CM | POA: Diagnosis present

## 2015-10-21 DIAGNOSIS — E1169 Type 2 diabetes mellitus with other specified complication: Principal | ICD-10-CM | POA: Diagnosis present

## 2015-10-21 DIAGNOSIS — I2510 Atherosclerotic heart disease of native coronary artery without angina pectoris: Secondary | ICD-10-CM | POA: Diagnosis present

## 2015-10-21 DIAGNOSIS — L97529 Non-pressure chronic ulcer of other part of left foot with unspecified severity: Secondary | ICD-10-CM | POA: Diagnosis present

## 2015-10-21 DIAGNOSIS — M86172 Other acute osteomyelitis, left ankle and foot: Secondary | ICD-10-CM

## 2015-10-21 DIAGNOSIS — F17210 Nicotine dependence, cigarettes, uncomplicated: Secondary | ICD-10-CM | POA: Diagnosis present

## 2015-10-21 DIAGNOSIS — Z7982 Long term (current) use of aspirin: Secondary | ICD-10-CM | POA: Diagnosis not present

## 2015-10-21 DIAGNOSIS — I255 Ischemic cardiomyopathy: Secondary | ICD-10-CM | POA: Diagnosis present

## 2015-10-21 DIAGNOSIS — I252 Old myocardial infarction: Secondary | ICD-10-CM | POA: Diagnosis not present

## 2015-10-21 DIAGNOSIS — Z23 Encounter for immunization: Secondary | ICD-10-CM

## 2015-10-21 DIAGNOSIS — Z951 Presence of aortocoronary bypass graft: Secondary | ICD-10-CM | POA: Diagnosis not present

## 2015-10-21 DIAGNOSIS — M868X7 Other osteomyelitis, ankle and foot: Secondary | ICD-10-CM | POA: Diagnosis present

## 2015-10-21 DIAGNOSIS — E669 Obesity, unspecified: Secondary | ICD-10-CM | POA: Diagnosis present

## 2015-10-21 DIAGNOSIS — Z79891 Long term (current) use of opiate analgesic: Secondary | ICD-10-CM

## 2015-10-21 DIAGNOSIS — F419 Anxiety disorder, unspecified: Secondary | ICD-10-CM | POA: Diagnosis present

## 2015-10-21 DIAGNOSIS — Z89421 Acquired absence of other right toe(s): Secondary | ICD-10-CM | POA: Diagnosis not present

## 2015-10-21 DIAGNOSIS — M869 Osteomyelitis, unspecified: Secondary | ICD-10-CM | POA: Diagnosis present

## 2015-10-21 HISTORY — PX: AMPUTATION TOE: SHX6595

## 2015-10-21 LAB — CBC
HCT: 40.8 % (ref 35.0–47.0)
Hemoglobin: 13.4 g/dL (ref 12.0–16.0)
MCH: 24.8 pg — ABNORMAL LOW (ref 26.0–34.0)
MCHC: 32.8 g/dL (ref 32.0–36.0)
MCV: 75.6 fL — ABNORMAL LOW (ref 80.0–100.0)
Platelets: 328 10*3/uL (ref 150–440)
RBC: 5.39 MIL/uL — ABNORMAL HIGH (ref 3.80–5.20)
RDW: 16 % — ABNORMAL HIGH (ref 11.5–14.5)
WBC: 11.7 10*3/uL — ABNORMAL HIGH (ref 3.6–11.0)

## 2015-10-21 LAB — BASIC METABOLIC PANEL
Anion gap: 12 (ref 5–15)
BUN: 20 mg/dL (ref 6–20)
CO2: 24 mmol/L (ref 22–32)
Calcium: 9 mg/dL (ref 8.9–10.3)
Chloride: 103 mmol/L (ref 101–111)
Creatinine, Ser: 1.4 mg/dL — ABNORMAL HIGH (ref 0.44–1.00)
GFR calc Af Amer: 46 mL/min — ABNORMAL LOW (ref >60)
GFR calc non Af Amer: 39 mL/min — ABNORMAL LOW (ref >60)
Glucose, Bld: 174 mg/dL — ABNORMAL HIGH (ref 65–99)
Potassium: 4.2 mmol/L (ref 3.5–5.1)
Sodium: 139 mmol/L (ref 135–145)

## 2015-10-21 LAB — GLUCOSE, CAPILLARY
Glucose-Capillary: 189 mg/dL — ABNORMAL HIGH (ref 65–99)
Glucose-Capillary: 145 mg/dL — ABNORMAL HIGH (ref 65–99)
Glucose-Capillary: 231 mg/dL — ABNORMAL HIGH (ref 65–99)

## 2015-10-21 SURGERY — AMPUTATION, TOE
Anesthesia: General | Site: Toe | Laterality: Left | Wound class: Dirty or Infected

## 2015-10-21 MED ORDER — PROPOFOL 500 MG/50ML IV EMUL
INTRAVENOUS | Status: DC | PRN
Start: 2015-10-21 — End: 2015-10-21
  Administered 2015-10-21: 100 ug/kg/min via INTRAVENOUS

## 2015-10-21 MED ORDER — NICOTINE 14 MG/24HR TD PT24
14.0000 mg | MEDICATED_PATCH | Freq: Every day | TRANSDERMAL | Status: DC
  Filled 2015-10-21: qty 1

## 2015-10-21 MED ORDER — HYDROCODONE-ACETAMINOPHEN 5-325 MG PO TABS
1.0000 | ORAL_TABLET | ORAL | Status: DC | PRN
  Administered 2015-10-21 – 2015-10-22 (×3): 2 via ORAL
  Filled 2015-10-21 (×3): qty 2

## 2015-10-21 MED ORDER — VANCOMYCIN HCL IN DEXTROSE 1-5 GM/200ML-% IV SOLN
1000.0000 mg | Freq: Once | INTRAVENOUS | Status: DC
  Filled 2015-10-21: qty 200

## 2015-10-21 MED ORDER — INSULIN ASPART 100 UNIT/ML ~~LOC~~ SOLN
0.0000 [IU] | Freq: Every day | SUBCUTANEOUS | Status: DC
  Administered 2015-10-21: 3 [IU] via SUBCUTANEOUS
  Filled 2015-10-21: qty 3
  Filled 2015-10-21: qty 5

## 2015-10-21 MED ORDER — ALBUTEROL SULFATE (2.5 MG/3ML) 0.083% IN NEBU: 2.5000 mg | INHALATION_SOLUTION | RESPIRATORY_TRACT | Status: DC | PRN

## 2015-10-21 MED ORDER — PIPERACILLIN-TAZOBACTAM 3.375 G IVPB
3.3750 g | Freq: Three times a day (TID) | INTRAVENOUS | Status: DC
  Administered 2015-10-21 – 2015-10-22 (×3): 3.375 g via INTRAVENOUS
  Filled 2015-10-21 (×6): qty 50

## 2015-10-21 MED ORDER — GABAPENTIN 400 MG PO CAPS
800.0000 mg | ORAL_CAPSULE | Freq: Three times a day (TID) | ORAL | Status: DC
  Administered 2015-10-21 – 2015-10-22 (×2): 800 mg via ORAL
  Filled 2015-10-21 (×2): qty 2

## 2015-10-21 MED ORDER — MIDAZOLAM HCL 2 MG/2ML IJ SOLN
INTRAMUSCULAR | Status: DC | PRN
Start: 2015-10-21 — End: 2015-10-21
  Administered 2015-10-21: 2 mg via INTRAVENOUS

## 2015-10-21 MED ORDER — ATORVASTATIN CALCIUM 20 MG PO TABS: 80.0000 mg | ORAL_TABLET | Freq: Every day | ORAL | Status: DC

## 2015-10-21 MED ORDER — INSULIN ASPART 100 UNIT/ML ~~LOC~~ SOLN
0.0000 [IU] | Freq: Three times a day (TID) | SUBCUTANEOUS | Status: DC
  Administered 2015-10-21: 2 [IU] via SUBCUTANEOUS
  Administered 2015-10-22: 5 [IU] via SUBCUTANEOUS
  Administered 2015-10-22: 2 [IU] via SUBCUTANEOUS
  Filled 2015-10-21 (×2): qty 2

## 2015-10-21 MED ORDER — VANCOMYCIN HCL 10 G IV SOLR
1500.0000 mg | Freq: Once | INTRAVENOUS | Status: AC
  Administered 2015-10-21: 1500 mg via INTRAVENOUS
  Filled 2015-10-21: qty 1500

## 2015-10-21 MED ORDER — PNEUMOCOCCAL VAC POLYVALENT 25 MCG/0.5ML IJ INJ
0.5000 mL | INJECTION | INTRAMUSCULAR | Status: AC
  Administered 2015-10-22: 0.5 mL via INTRAMUSCULAR
  Filled 2015-10-21: qty 0.5

## 2015-10-21 MED ORDER — NEOMYCIN-POLYMYXIN B GU 40-200000 IR SOLN
Status: DC | PRN
  Administered 2015-10-21: 2 mL

## 2015-10-21 MED ORDER — ENOXAPARIN SODIUM 40 MG/0.4ML ~~LOC~~ SOLN
40.0000 mg | SUBCUTANEOUS | Status: DC
  Administered 2015-10-22: 40 mg via SUBCUTANEOUS
  Filled 2015-10-21: qty 0.4

## 2015-10-21 MED ORDER — INSULIN GLARGINE 100 UNIT/ML ~~LOC~~ SOLN
40.0000 [IU] | Freq: Two times a day (BID) | SUBCUTANEOUS | Status: DC
  Administered 2015-10-21 – 2015-10-22 (×2): 40 [IU] via SUBCUTANEOUS
  Filled 2015-10-21 (×3): qty 0.4

## 2015-10-21 MED ORDER — PROPOFOL 10 MG/ML IV BOLUS
INTRAVENOUS | Status: DC | PRN
  Administered 2015-10-21: 30 mg via INTRAVENOUS

## 2015-10-21 MED ORDER — LISINOPRIL 20 MG PO TABS: 40.0000 mg | ORAL_TABLET | Freq: Every day | ORAL | Status: DC

## 2015-10-21 MED ORDER — ONDANSETRON HCL 4 MG PO TABS
4.0000 mg | ORAL_TABLET | Freq: Four times a day (QID) | ORAL | Status: DC | PRN
  Administered 2015-10-21: 4 mg via ORAL
  Filled 2015-10-21: qty 1

## 2015-10-21 MED ORDER — SODIUM CHLORIDE 0.9 % IV SOLN
INTRAVENOUS | Status: DC
  Administered 2015-10-21 – 2015-10-22 (×2): via INTRAVENOUS

## 2015-10-21 MED ORDER — BUPIVACAINE HCL 0.5 % IJ SOLN
INTRAMUSCULAR | Status: DC | PRN
  Administered 2015-10-21: 10 mL

## 2015-10-21 MED ORDER — ACETAMINOPHEN 650 MG RE SUPP: 650.0000 mg | Freq: Four times a day (QID) | RECTAL | Status: DC | PRN

## 2015-10-21 MED ORDER — HYDROCODONE-ACETAMINOPHEN 5-325 MG PO TABS: 1.0000 | ORAL_TABLET | ORAL | Status: DC | PRN

## 2015-10-21 MED ORDER — ONDANSETRON HCL 4 MG/2ML IJ SOLN: 4.0000 mg | Freq: Once | INTRAMUSCULAR | Status: DC | PRN

## 2015-10-21 MED ORDER — MORPHINE SULFATE (PF) 4 MG/ML IV SOLN: 4.0000 mg | INTRAVENOUS | Status: DC | PRN

## 2015-10-21 MED ORDER — METOPROLOL TARTRATE 25 MG PO TABS: 25.0000 mg | ORAL_TABLET | Freq: Two times a day (BID) | ORAL | Status: DC

## 2015-10-21 MED ORDER — ACETAMINOPHEN 325 MG PO TABS: 650.0000 mg | ORAL_TABLET | Freq: Four times a day (QID) | ORAL | Status: DC | PRN

## 2015-10-21 MED ORDER — VANCOMYCIN HCL 10 G IV SOLR
1500.0000 mg | INTRAVENOUS | Status: DC
  Filled 2015-10-21: qty 1500

## 2015-10-21 MED ORDER — ONDANSETRON HCL 4 MG/2ML IJ SOLN: 4.0000 mg | Freq: Four times a day (QID) | INTRAMUSCULAR | Status: DC | PRN

## 2015-10-21 MED ORDER — FENTANYL CITRATE (PF) 100 MCG/2ML IJ SOLN: 25.0000 ug | INTRAMUSCULAR | Status: DC | PRN

## 2015-10-21 SURGICAL SUPPLY — 43 items
BANDAGE ACE 4X5 VEL STRL LF (GAUZE/BANDAGES/DRESSINGS) ×3 IMPLANT
BLADE MED AGGRESSIVE (BLADE) ×3 IMPLANT
BLADE OSC/SAGITTAL MD 5.5X18 (BLADE) ×3 IMPLANT
BLADE SURG 15 STRL LF DISP TIS (BLADE) ×2 IMPLANT
BLADE SURG 15 STRL SS (BLADE) ×4
BLADE SURG MINI STRL (BLADE) ×3 IMPLANT
BNDG ESMARK 4X12 TAN STRL LF (GAUZE/BANDAGES/DRESSINGS) ×3 IMPLANT
BNDG GAUZE 4.5X4.1 6PLY STRL (MISCELLANEOUS) ×3 IMPLANT
CANISTER SUCT 1200ML W/VALVE (MISCELLANEOUS) ×3 IMPLANT
CLOSURE WOUND 1/4X4 (GAUZE/BANDAGES/DRESSINGS) ×1
CUFF TOURN 18 STER (MISCELLANEOUS) ×3 IMPLANT
CUFF TOURN DUAL PL 12 NO SLV (MISCELLANEOUS) IMPLANT
DRAPE FLUOR MINI C-ARM 54X84 (DRAPES) ×3 IMPLANT
DURAPREP 26ML APPLICATOR (WOUND CARE) ×3 IMPLANT
ELECT REM PT RETURN 9FT ADLT (ELECTROSURGICAL) ×3
ELECTRODE REM PT RTRN 9FT ADLT (ELECTROSURGICAL) ×1 IMPLANT
GAUZE PETRO XEROFOAM 1X8 (MISCELLANEOUS) ×3 IMPLANT
GAUZE SPONGE 4X4 12PLY STRL (GAUZE/BANDAGES/DRESSINGS) ×3 IMPLANT
GAUZE STRETCH 2X75IN STRL (MISCELLANEOUS) ×3 IMPLANT
GLOVE BIO SURGEON STRL SZ7.5 (GLOVE) ×3 IMPLANT
GLOVE INDICATOR 8.0 STRL GRN (GLOVE) ×3 IMPLANT
GOWN STRL REUS W/ TWL LRG LVL3 (GOWN DISPOSABLE) ×2 IMPLANT
GOWN STRL REUS W/TWL LRG LVL3 (GOWN DISPOSABLE) ×4
HANDPIECE VERSAJET DEBRIDEMENT (MISCELLANEOUS) ×3 IMPLANT
KIT RM TURNOVER STRD PROC AR (KITS) ×3 IMPLANT
LABEL OR SOLS (LABEL) ×3 IMPLANT
NEEDLE FILTER BLUNT 18X 1/2SAF (NEEDLE) ×2
NEEDLE FILTER BLUNT 18X1 1/2 (NEEDLE) ×1 IMPLANT
NEEDLE HYPO 25X1 1.5 SAFETY (NEEDLE) ×6 IMPLANT
NS IRRIG 500ML POUR BTL (IV SOLUTION) ×3 IMPLANT
PACK EXTREMITY ARMC (MISCELLANEOUS) ×3 IMPLANT
SOL .9 NS 3000ML IRR  AL (IV SOLUTION) ×2
SOL .9 NS 3000ML IRR UROMATIC (IV SOLUTION) ×1 IMPLANT
SOL PREP PVP 2OZ (MISCELLANEOUS) ×3
SOLUTION PREP PVP 2OZ (MISCELLANEOUS) ×1 IMPLANT
STOCKINETTE STRL 6IN 960660 (GAUZE/BANDAGES/DRESSINGS) ×3 IMPLANT
STRIP CLOSURE SKIN 1/4X4 (GAUZE/BANDAGES/DRESSINGS) ×2 IMPLANT
SUT ETHILON 4-0 (SUTURE) ×4
SUT ETHILON 4-0 FS2 18XMFL BLK (SUTURE) ×2
SUT ETHILON 5-0 FS-2 18 BLK (SUTURE) ×3 IMPLANT
SUTURE ETHLN 4-0 FS2 18XMF BLK (SUTURE) ×2 IMPLANT
SWAB DUAL CULTURE TRANS RED ST (MISCELLANEOUS) ×3 IMPLANT
SYRINGE 10CC LL (SYRINGE) ×3 IMPLANT

## 2015-10-21 NOTE — Progress Notes (Addendum)
Pharmacy Antibiotic Note  Melinda [name] is a 60 y.o. female admitted on 10/21/2015 with left third toe osteomyelitis.  Pharmacy has been consulted for Vancomycin and Zosyn dosing.  Plan: Zosyn 3.375g IV q8h (4 hour infusion).   Labs for today have yet to be drawn.  Pt wt 113.4 kg. Will order vancomycin 1500 mg IV x1 for now pending more info.   SCr 1.40, CrCl 42 ml/min Ke 0.039, half life 18h, Vd 59 L Will continue with vancomycin 1500 mg IV q24h to start 18 h after initial one time dose for stacked dosing. Trough 6/26 at 1300. Will need to closely monitor renal function as pt at risk for accumulation. Goal trough 15-20 mcg/ml.      No data recorded.  No results for input(s): WBC, CREATININE, LATICACIDVEN, VANCOTROUGH, VANCOPEAK, VANCORANDOM, GENTTROUGH, GENTPEAK, GENTRANDOM, TOBRATROUGH, TOBRAPEAK, TOBRARND, AMIKACINPEAK, AMIKACINTROU, AMIKACIN in the last 168 hours.  CrCl cannot be calculated (Unknown ideal weight.).    No Known Allergies  Antimicrobials this admission: Vanc  6/22 >>  Zosyn  6/22 >>   Dose adjustments this admission:   Microbiology results:   Thank you for allowing pharmacy to be a part of this patient's care.  Rocky Morel 10/21/2015 4:20 PM

## 2015-10-21 NOTE — Interval H&P Note (Signed)
History and Physical Interval Note:  10/21/2015 4:56 PM  Melinda [name]  has presented today for surgery, with the diagnosis of n/a  The various methods of treatment have been discussed with the patient and family. After consideration of risks, benefits and other options for treatment, the patient has consented to  Procedure(s): AMPUTATION TOE (Left) as a surgical intervention .  The patient's history has been reviewed, patient examined, no change in status, stable for surgery.  I have reviewed the patient's chart and labs.  Questions were answered to the patient's satisfaction.     [name]

## 2015-10-21 NOTE — Anesthesia Procedure Notes (Signed)
Date/Time: 10/21/2015 5:18 PM Performed by: Nelda Marseille Pre-anesthesia Checklist: Patient identified, Emergency Drugs available, Suction available, Patient being monitored and Timeout performed Oxygen Delivery Method: Simple face mask

## 2015-10-21 NOTE — Consult Note (Signed)
Reason for Consult: Osteomyelitis left third toe Referring Physician: [facility]  Melinda [name] is an 60 y.o. female.  HPI: The patient relates a several week history of a sore on the tip of her left third toe. She was recently hospitalized about 3 weeks ago but at that point the ulceration was superficial. She has been monitored outpatient but today presented and there was exposed bone through the tip of the toe and decision was made for admission to the hospital for amputation of the left third toe as previously performed on her right third toe  Past Medical History  Diagnosis Date  . HTN (hypertension)   . Tobacco abuse   . Anxiety   . Peripheral neuropathy ([place])   . Obesity   . Coronary artery disease     a. NSTEMI 06/2014; b. 4 vessel CABG 06/08/2014 LIMA to LAD, SVG to Diagonal, SVG to OM, & SVG to PDA  . Ischemic cardiomyopathy     a. echo 06/04/14 EF 40-45%, HK of entire inferolateral and inferior myocardium c.w infarct of RCA/LCx, GR2DD, mild MR  . IDDM (insulin dependent diabetes mellitus) ([place])   . GERD (gastroesophageal reflux disease)   . OA (osteoarthritis) of knee   . Osteomyelitis [facility])     Past Surgical History  Procedure Laterality Date  . Breast biopsy    . Toe amputation Right 12/2011    rt middle toe  . Cardiac catheterization  06/2014    95% stenosis mLAD, occlusion ostial OM1, 70% stenosis LCx, 95% stenosis mRCA, EF 45%.  . US echocardiography  06/2014    EF 50-55%, HK of inf/post/inferolat walls, Ao sclerosis  . Ct abd w & pelvis wo cm  06/2014    nl liver, gallbladder, spleen, mild diverticular changes, no bowel wall inflammation, appendix nl, no hernia, no other sig abnormalities  . Coronary artery bypass graft N/A 06/08/2014    Procedure: CORONARY ARTERY BYPASS GRAFTING (CABG);  Surgeon: Grace Isaac, MD;  Location: [place];  Service: Open Heart Surgery;  Laterality: N/A;  Times 4 using left internal mammary artery to LAD artery and  endoscopically harvested bilateral saphenous vein to Obtuse Marginal, Diagonal and Posterior Descending coronary arteries.  Darden Dates without cardioversion N/A 06/08/2014    Procedure: TRANSESOPHAGEAL ECHOCARDIOGRAM (TEE);  Surgeon: Grace Isaac, MD;  Location: [place];  Service: Open Heart Surgery;  Laterality: N/A;    Family History  Problem Relation Age of Onset  . CAD Mother   . Hypertension Mother   . CAD Father     Social History:  reports that she has been smoking Cigarettes.  She has a 20 pack-year smoking history. She has never used smokeless tobacco. She reports that she does not drink alcohol or use illicit drugs.  Allergies: No Known Allergies  Medications: Scheduled:  No results found for this or any previous visit (from the past 48 hour(s)).  No results found.  Review of Systems  Constitutional: Negative for fever and chills.  HENT: Negative.   Eyes: Negative.   Respiratory: Negative.   Cardiovascular: Positive for leg swelling.  Gastrointestinal: Negative for nausea and vomiting.  Genitourinary: Negative.   Musculoskeletal: Negative.   Skin:       The patient relates a draining ulceration on the tip of her left third toe. Some recent redness and swelling in the left foot.  Neurological:       Significant neuropathy in her feet related to her diabetes.  Endo/Heme/Allergies: Negative.  Psychiatric/Behavioral: Negative.    There were no vitals taken for this visit. Physical Exam  Cardiovascular:  DP and PT pulses are diminished but palpable. I filling time intact with exception of the left third toe which has an ulceration down to exposed bone  Musculoskeletal:  Adequate range of motion of the pedal joints. Previous amputation of the right third toe.  Neurological:  Loss of protective threshold monofilament wire bilateral. Proprioception is impaired.  Skin:  Skin is warm dry and somewhat atrophic. Some erythema and edema in the left forefoot. All thickness  ulceration at the distal tip of the left third toe with exposed visible bone.    Assessment/Plan: Assessment: Osteomyelitis left third toe. Diabetes with associated neuropathy.  Plan: Discussed with the patient the need for amputation of the left third toe since there is exposed infected bone. Discussed possible risks and complications of the procedure area patient elects to go ahead with amputation. Patient has been nothing by mouth all day and will be kept that way until after surgery. Consent form for amputation of the left third toe. Plan for surgery later this afternoon.  [name] 10/21/2015, 3:35 PM

## 2015-10-21 NOTE — Anesthesia Preprocedure Evaluation (Signed)
Anesthesia Evaluation  Patient identified by MRN, date of birth, ID band Patient awake    Reviewed: Allergy & Precautions, H&P , NPO status , Patient's Chart, lab work & pertinent test results, reviewed documented beta blocker date and time   History of Anesthesia Complications Negative for: history of anesthetic complications  Airway Mallampati: II  TM Distance: >3 FB Neck ROM: full    Dental no notable dental hx. (+) Edentulous Upper, Upper Dentures, Poor Dentition, Missing   Pulmonary shortness of breath and with exertion, sleep apnea (likely based on history) , neg COPD, neg recent URI, Current Smoker,    Pulmonary exam normal breath sounds clear to auscultation       Cardiovascular Exercise Tolerance: Good hypertension, On Medications and On Home Beta Blockers (-) angina+ CAD, + Past MI, + CABG and + DOE  (-) Cardiac Stents Normal cardiovascular exam(-) dysrhythmias (-) Valvular Problems/Murmurs Rhythm:regular Rate:Normal  Ischemic cardiomyopathy   Neuro/Psych neg Seizures  Neuromuscular disease (peripheral neuropathy) negative psych ROS   GI/Hepatic Neg liver ROS, GERD  Poorly Controlled,  Endo/Other  diabetes, Insulin Dependent  Renal/GU negative Renal ROS  negative genitourinary   Musculoskeletal   Abdominal   Peds  Hematology negative hematology ROS (+)   Anesthesia Other Findings Past Medical History:   HTN (hypertension)                                           Tobacco abuse                                                Anxiety                                                      Peripheral neuropathy ([facility])                                  Obesity                                                      Coronary artery disease                                        Comment:a. NSTEMI 06/2014; b. 4 vessel CABG 06/08/2014               LIMA to LAD, SVG to Diagonal, SVG to OM, & SVG               to PDA  Ischemic cardiomyopathy                                        Comment:a. echo 06/04/14 EF 40-45%, HK of entire  inferolateral and inferior myocardium c.w               infarct of RCA/LCx, GR2DD, mild MR   IDDM (insulin dependent diabetes mellitus) ([facility]*              GERD (gastroesophageal reflux disease)                       OA (osteoarthritis) of knee                                  Osteomyelitis (H[facility])                                          Reproductive/Obstetrics negative OB ROS                             Anesthesia Physical Anesthesia Plan  ASA: III  Anesthesia Plan: General   Post-op Pain Management:    Induction:   Airway Management Planned:   Additional Equipment:   Intra-op Plan:   Post-operative Plan:   Informed Consent: I have reviewed the patients History and Physical, chart, labs and discussed the procedure including the risks, benefits and alternatives for the proposed anesthesia with the patient or authorized representative who has indicated his/her understanding and acceptance.   Dental Advisory Given  Plan Discussed with: Anesthesiologist, CRNA and Surgeon  Anesthesia Plan Comments:         Anesthesia Quick Evaluation

## 2015-10-21 NOTE — Transfer of Care (Signed)
Immediate Anesthesia Transfer of Care Note  Patient: Melinda [name]  Procedure(s) Performed: Procedure(s): AMPUTATION TOE (Left)  Patient Location: PACU  Anesthesia Type:General  Level of Consciousness: sedated  Airway & Oxygen Therapy: Patient Spontanous Breathing and Patient connected to face mask oxygen  Post-op Assessment: Report given to RN and Post -op Vital signs reviewed and stable  Post vital signs: Reviewed and stable  Last Vitals:  Filed Vitals:   10/21/15 1545  BP: 117/64  Pulse: 103  Temp: 37.1 C  Resp: 18    Last Pain:  Filed Vitals:   10/21/15 1627  PainSc: 4          Complications: No apparent anesthesia complications

## 2015-10-21 NOTE — H&P (Addendum)
[facility] [place] NAME: Melinda [name]    MR#:  [id number]  DATE OF BIRTH:  12/23/1955  DATE OF ADMISSION:  10/21/2015  PRIMARY CARE PHYSICIAN: Elba Barman, MD   REQUESTING/REFERRING PHYSICIAN: Dr. Cleda Mccreedy.  CHIEF COMPLAINT:  No chief complaint on file.  Left foot pain HISTORY OF PRESENT ILLNESS:  Melinda [name]  is a 61 y.o. female with a known history of I per tension, diabetes, peripheral neuropathy, CAD and osteomyelitis on the right third toe, status post amputation. She was recently  hospitalized for left third toe ulceration and was discharged with clindamycin. The patient was found left third toe osteomyelitis in podiatrist's office. Dr. Cleda Mccreedy sent the patient for direct admission for amputation. The patient denies any fever or chills but complains of left foot and leg pain.  PAST MEDICAL HISTORY:   Past Medical History  Diagnosis Date  . HTN (hypertension)   . Tobacco abuse   . Anxiety   . Peripheral neuropathy ([place])   . Obesity   . Coronary artery disease     a. NSTEMI 06/2014; b. 4 vessel CABG 06/08/2014 LIMA to LAD, SVG to Diagonal, SVG to OM, & SVG to PDA  . Ischemic cardiomyopathy     a. echo 06/04/14 EF 40-45%, HK of entire inferolateral and inferior myocardium c.w infarct of RCA/LCx, GR2DD, mild MR  . IDDM (insulin dependent diabetes mellitus) ([place])   . GERD (gastroesophageal reflux disease)   . OA (osteoarthritis) of knee   . Osteomyelitis ([place])     PAST SURGICAL HISTORY:   Past Surgical History  Procedure Laterality Date  . Breast biopsy    . Toe amputation Right 12/2011    rt middle toe  . Cardiac catheterization  06/2014    95% stenosis mLAD, occlusion ostial OM1, 70% stenosis LCx, 95% stenosis mRCA, EF 45%.  . US echocardiography  06/2014    EF 50-55%, HK of inf/post/inferolat walls, Ao sclerosis  . Ct abd w & pelvis wo cm  06/2014    nl liver, gallbladder, spleen, mild diverticular changes, no bowel  wall inflammation, appendix nl, no hernia, no other sig abnormalities  . Coronary artery bypass graft N/A 06/08/2014    Procedure: CORONARY ARTERY BYPASS GRAFTING (CABG);  Surgeon: Grace Isaac, MD;  Location: [place];  Service: Open Heart Surgery;  Laterality: N/A;  Times 4 using left internal mammary artery to LAD artery and endoscopically harvested bilateral saphenous vein to Obtuse Marginal, Diagonal and Posterior Descending coronary arteries.  Darden Dates without cardioversion N/A 06/08/2014    Procedure: TRANSESOPHAGEAL ECHOCARDIOGRAM (TEE);  Surgeon: Grace Isaac, MD;  Location: [place];  Service: Open Heart Surgery;  Laterality: N/A;    SOCIAL HISTORY:   Social History  Substance Use Topics  . Smoking status: Current Every Day Smoker -- 0.50 packs/day for 40 years    Types: Cigarettes  . Smokeless tobacco: Never Used  . Alcohol Use: No    FAMILY HISTORY:   Family History  Problem Relation Age of Onset  . CAD Mother   . Hypertension Mother   . CAD Father     DRUG ALLERGIES:  No Known Allergies  REVIEW OF SYSTEMS:  CONSTITUTIONAL: No fever, fatigue or weakness.  EYES: No blurred or double vision.  EARS, NOSE, AND THROAT: No tinnitus or ear pain.  RESPIRATORY: No cough, shortness of breath, wheezing or hemoptysis.  CARDIOVASCULAR: No chest pain, orthopnea, edema.  GASTROINTESTINAL: No nausea, vomiting, diarrhea  or abdominal pain.  GENITOURINARY: No dysuria, hematuria.  ENDOCRINE: No polyuria, nocturia,  HEMATOLOGY: No anemia, easy bruising or bleeding SKIN: No rash or lesion. MUSCULOSKELETAL: Left foot and leg pain.Marland Kitchen   NEUROLOGIC: No tingling, numbness, weakness.  PSYCHIATRY: No anxiety or depression.   MEDICATIONS AT HOME:   Prior to Admission medications   Medication Sig Start Date End Date Taking? Authorizing Provider  albuterol (PROVENTIL) (2.5 MG/3ML) 0.083% nebulizer solution Take 3 mLs (2.5 mg total) by nebulization every 2 (two) hours as needed for wheezing.  10/04/15   Lytle Butte, MD  aspirin EC 81 MG EC tablet Take 1 tablet (81 mg total) by mouth daily. 10/04/15   Lytle Butte, MD  aspirin EC 81 MG tablet Take 1 tablet (81 mg total) by mouth daily. 06/24/14   Rise Mu, [name]  atorvastatin (LIPITOR) 80 MG tablet Take 1 tablet (80 mg total) by mouth daily at 6 PM. 04/09/15   Minna Merritts, MD  atorvastatin (LIPITOR) 80 MG tablet Take 1 tablet (80 mg total) by mouth daily at 6 PM. 10/04/15   Lytle Butte, MD  clindamycin (CLEOCIN) 300 MG capsule Take 1 capsule (300 mg total) by mouth 3 (three) times daily. 10/04/15   Lytle Butte, MD  clopidogrel (PLAVIX) 75 MG tablet Take 1 tablet (75 mg total) by mouth daily. 04/09/15   Minna Merritts, MD  clopidogrel (PLAVIX) 75 MG tablet Take 1 tablet (75 mg total) by mouth daily. 10/04/15   Lytle Butte, MD  gabapentin (NEURONTIN) 800 MG tablet Take 1 tablet (800 mg total) by mouth 3 (three) times daily. 10/04/15   Lytle Butte, MD  HYDROcodone-acetaminophen (NORCO) 5-325 MG tablet Take 1 tablet by mouth every 6 (six) hours as needed for moderate pain. 09/06/15   Paulette Blanch, MD  HYDROcodone-acetaminophen (NORCO/VICODIN) 5-325 MG tablet Take 1-2 tablets by mouth every 4 (four) hours as needed for moderate pain. 10/04/15   Lytle Butte, MD  insulin aspart (NOVOLOG) 100 UNIT/ML injection CBG < 70: implement hypoglycemia protocol CBG 70 - 120: 0 units CBG 121 - 150: 3 units CBG 151 - 200: 4 units CBG 201 - 250: 7 units CBG 251 - 300: 11 units CBG 301 - 350: 15 units CBG 351 - 400: 20 units 10/04/15   Lytle Butte, MD  insulin aspart (NOVOLOG) 100 UNIT/ML injection CBG < 70: implement hypoglycemia protocol CBG 70 - 120: 0 units CBG 121 - 150: 0 units CBG 151 - 200: 0 units CBG 201 - 250: 2 units CBG 251 - 300: 3 units CBG 301 - 350: 4 units CBG 351 - 400: 5 units CBG > 400 10/04/15   Lytle Butte, MD  insulin glargine (LANTUS) 100 UNIT/ML injection Inject 40 Units into the skin 2 (two) times daily.    Historical  Provider, MD  insulin glargine (LANTUS) 100 UNIT/ML injection Inject 0.4 mLs (40 Units total) into the skin 2 (two) times daily. 10/04/15   Lytle Butte, MD  lisinopril (PRINIVIL,ZESTRIL) 40 MG tablet Take 1 tablet (40 mg total) by mouth daily. 04/09/15   Minna Merritts, MD  lisinopril (PRINIVIL,ZESTRIL) 40 MG tablet Take 1 tablet (40 mg total) by mouth daily. 10/04/15   Lytle Butte, MD  metoprolol tartrate (LOPRESSOR) 25 MG tablet Take 1 tablet (25 mg total) by mouth 2 (two) times daily. 04/09/15   Minna Merritts, MD  metoprolol tartrate (LOPRESSOR) 25 MG tablet Take 1 tablet (  25 mg total) by mouth 2 (two) times daily. 10/04/15   Lytle Butte, MD  ondansetron (ZOFRAN ODT) 4 MG disintegrating tablet Take 1 tablet (4 mg total) by mouth every 8 (eight) hours as needed for nausea or vomiting. 09/06/15   Paulette Blanch, MD  oxyCODONE (OXY IR/ROXICODONE) 5 MG immediate release tablet Take 1-2 tablets (5-10 mg total) by mouth every 6 (six) hours as needed for severe pain. 07/13/14   Erin R Barrett, [name]      VITAL SIGNS:  Blood pressure 117/64, pulse 103, resp. rate 18, SpO2 99 %.  PHYSICAL EXAMINATION:  GENERAL:  60 y.o.-year-old patient lying in the bed with no acute distress.  EYES: Pupils equal, round, reactive to light and accommodation. No scleral icterus. Extraocular muscles intact.  HEENT: Head atraumatic, normocephalic. Oropharynx and nasopharynx clear.  NECK:  Supple, no jugular venous distention. No thyroid enlargement, no tenderness.  LUNGS: Normal breath sounds bilaterally, no wheezing, rales,rhonchi or crepitation. No use of accessory muscles of respiration.  CARDIOVASCULAR: S1, S2 normal. No murmurs, rubs, or gallops.  ABDOMEN: Soft, nontender, nondistended. Bowel sounds present. No organomegaly or mass.  EXTREMITIES: No pedal edema, cyanosis, or clubbing. Left third toe in dressing. NEUROLOGIC: Cranial nerves II through XII are intact. Muscle strength 5/5 in all extremities. Sensation  intact. Gait not checked.  PSYCHIATRIC: The patient is alert and oriented x 3.  SKIN: No obvious rash, lesion, or ulcer.   LABORATORY PANEL:   CBC No results for input(s): WBC, HGB, HCT, PLT in the last 168 hours. ------------------------------------------------------------------------------------------------------------------  Chemistries  No results for input(s): NA, K, CL, CO2, GLUCOSE, BUN, CREATININE, CALCIUM, MG, AST, ALT, ALKPHOS, BILITOT in the last 168 hours.  Invalid input(s): GFRCGP ------------------------------------------------------------------------------------------------------------------  Cardiac Enzymes No results for input(s): TROPONINI in the last 168 hours. ------------------------------------------------------------------------------------------------------------------  RADIOLOGY:  No results found.  EKG:   Orders placed or performed during the hospital encounter of 09/05/15  . EKG 12-Lead  . EKG 12-Lead  . ED EKG within 10 minutes  . ED EKG within 10 minutes  . EKG    IMPRESSION AND PLAN:   Left third toe osteomyelitis Start vancomycin and Zosyn, nothing by mouth, IV fluid support and follow-up with Dr. Cleda Mccreedy for surgery today.  Diabetes. Start sliding scale continue Lantus 40 units twice a day.  Hypertension, Hold Lopressor and lisinopril due to low side blood pressure. May resume later.  Tobacco abuse. Smoking cessation was counseled for 3 minutes, given nicotine patch.  I discussed with Dr. Cleda Mccreedy. All the records are reviewed and case discussed with ED provider. Management plans discussed with the patient, family and they are in agreement.  CODE STATUS: Full code.  TOTAL TIME TAKING CARE OF THIS PATIENT: 52 minutes.    Demetrios Loll M.D on 10/21/2015 at 4:07 PM  Between 7am to 6pm - Pager - [phone]  After 6pm go to [email or web address] - password [email or web address] [facility]  Office  [phone]  CC: Primary care  physician; Elba Barman, MD

## 2015-10-21 NOTE — H&P (View-Only) (Signed)
Reason for Consult: Osteomyelitis left third toe Referring Physician: [facility]  Melinda [name] is an 60 y.o. female.  HPI: The patient relates a several week history of a sore on the tip of her left third toe. She was recently hospitalized about 3 weeks ago but at that point the ulceration was superficial. She has been monitored outpatient but today presented and there was exposed bone through the tip of the toe and decision was made for admission to the hospital for amputation of the left third toe as previously performed on her right third toe  Past Medical History  Diagnosis Date  . HTN (hypertension)   . Tobacco abuse   . Anxiety   . Peripheral neuropathy ([place])   . Obesity   . Coronary artery disease     a. NSTEMI 06/2014; b. 4 vessel CABG 06/08/2014 LIMA to LAD, SVG to Diagonal, SVG to OM, & SVG to PDA  . Ischemic cardiomyopathy     a. echo 06/04/14 EF 40-45%, HK of entire inferolateral and inferior myocardium c.w infarct of RCA/LCx, GR2DD, mild MR  . IDDM (insulin dependent diabetes mellitus) ([name])   . GERD (gastroesophageal reflux disease)   . OA (osteoarthritis) of knee   . Osteomyelitis [facility])     Past Surgical History  Procedure Laterality Date  . Breast biopsy    . Toe amputation Right 12/2011    rt middle toe  . Cardiac catheterization  06/2014    95% stenosis mLAD, occlusion ostial OM1, 70% stenosis LCx, 95% stenosis mRCA, EF 45%.  . US echocardiography  06/2014    EF 50-55%, HK of inf/post/inferolat walls, Ao sclerosis  . Ct abd w & pelvis wo cm  06/2014    nl liver, gallbladder, spleen, mild diverticular changes, no bowel wall inflammation, appendix nl, no hernia, no other sig abnormalities  . Coronary artery bypass graft N/A 06/08/2014    Procedure: CORONARY ARTERY BYPASS GRAFTING (CABG);  Surgeon: Grace Isaac, MD;  Location: [facility];  Service: Open Heart Surgery;  Laterality: N/A;  Times 4 using left internal mammary artery to LAD artery and  endoscopically harvested bilateral saphenous vein to Obtuse Marginal, Diagonal and Posterior Descending coronary arteries.  Darden Dates without cardioversion N/A 06/08/2014    Procedure: TRANSESOPHAGEAL ECHOCARDIOGRAM (TEE);  Surgeon: Grace Isaac, MD;  Location: [facility];  Service: Open Heart Surgery;  Laterality: N/A;    Family History  Problem Relation Age of Onset  . CAD Mother   . Hypertension Mother   . CAD Father     Social History:  reports that she has been smoking Cigarettes.  She has a 20 pack-year smoking history. She has never used smokeless tobacco. She reports that she does not drink alcohol or use illicit drugs.  Allergies: No Known Allergies  Medications: Scheduled:  No results found for this or any previous visit (from the past 48 hour(s)).  No results found.  Review of Systems  Constitutional: Negative for fever and chills.  HENT: Negative.   Eyes: Negative.   Respiratory: Negative.   Cardiovascular: Positive for leg swelling.  Gastrointestinal: Negative for nausea and vomiting.  Genitourinary: Negative.   Musculoskeletal: Negative.   Skin:       The patient relates a draining ulceration on the tip of her left third toe. Some recent redness and swelling in the left foot.  Neurological:       Significant neuropathy in her feet related to her diabetes.  Endo/Heme/Allergies: Negative.  Psychiatric/Behavioral: Negative.    There were no vitals taken for this visit. Physical Exam  Cardiovascular:  DP and PT pulses are diminished but palpable. I filling time intact with exception of the left third toe which has an ulceration down to exposed bone  Musculoskeletal:  Adequate range of motion of the pedal joints. Previous amputation of the right third toe.  Neurological:  Loss of protective threshold monofilament wire bilateral. Proprioception is impaired.  Skin:  Skin is warm dry and somewhat atrophic. Some erythema and edema in the left forefoot. All thickness  ulceration at the distal tip of the left third toe with exposed visible bone.    Assessment/Plan: Assessment: Osteomyelitis left third toe. Diabetes with associated neuropathy.  Plan: Discussed with the patient the need for amputation of the left third toe since there is exposed infected bone. Discussed possible risks and complications of the procedure area patient elects to go ahead with amputation. Patient has been nothing by mouth all day and will be kept that way until after surgery. Consent form for amputation of the left third toe. Plan for surgery later this afternoon.  [name] 10/21/2015, 3:35 PM

## 2015-10-21 NOTE — Progress Notes (Signed)
Pt complained of pain in her left foot x 1 and was medicated for pain. Started on Vancomycin and Zosyn. Requested medication for indigestion, medicated with po Zofran. Covered with 3 units sliding scale for FBS 231 this HS. Felt better after having a later dinner. Complained she felt itchy, but no redness or rash noted. Pt stated it passed and that the ABX  "is powerful stuff." No other concerns at this time.

## 2015-10-21 NOTE — Op Note (Signed)
Date of operation: 10/21/2015.  Surgeon: [name] DPM.  Preoperative diagnosis: Osteomyelitis left third toe.  Postoperative diagnosis: Same.  Procedure: Amputation left third toe.  Anesthesia: Local Mac.  Hemostasis: None.  Estimated blood loss: Approximately 10 cc.  Pathology: Left third toe.  Cultures: Bone cultures distal phalanx left third toe.  Complications: None apparent.  Operative indications: The patient has a several week history of an ulceration on her left third toe. Failed outpatient treatment with local wound care and the ulcer progressed down to the level of exposed bone and osteomyelitis and decision was made for hospitalization and amputation of her left third toe.  Operative procedure: Patient was taken to the operating room and placed on the table in the supine position. Following satisfactory sedation the left forefoot was anesthetized with 10 cc of 0.5% bupivacaine plain around the third metatarsal shaft. The foot was then prepped and draped in usual sterile fashion. Attention was then directed to the distal aspect of the left foot where an elliptical incision was made coursing dorsal to plantar around the medial and lateral aspect of the base of the third toe. Incision was carried sharply down to the level of bone and then periosteal dissection carried back to the level of the metatarsophalangeal joint where the toe was disarticulated and removed in toto. Good healthy bleeding granular tissues were noted but there was not excessive bleeding. The wound was then flushed with copious amounts of sterile saline and closed using 4-0 nylon vertical mattress and simple interrupted sutures. Xeroform and a sterile gauze bandage followed by Kerlix and Ace wrap was applied to the left foot. Patient tolerated the procedure and anesthesia well and was transported to the PACU with vital signs stable and in good condition.

## 2015-10-22 ENCOUNTER — Encounter: Payer: Self-pay | Admitting: Podiatry

## 2015-10-22 LAB — BASIC METABOLIC PANEL
Anion gap: 8 (ref 5–15)
BUN: 24 mg/dL — ABNORMAL HIGH (ref 6–20)
CO2: 28 mmol/L (ref 22–32)
Calcium: 8.5 mg/dL — ABNORMAL LOW (ref 8.9–10.3)
Chloride: 103 mmol/L (ref 101–111)
Creatinine, Ser: 1.57 mg/dL — ABNORMAL HIGH (ref 0.44–1.00)
GFR calc Af Amer: 40 mL/min — ABNORMAL LOW (ref >60)
GFR calc non Af Amer: 34 mL/min — ABNORMAL LOW (ref >60)
Glucose, Bld: 195 mg/dL — ABNORMAL HIGH (ref 65–99)
Potassium: 4 mmol/L (ref 3.5–5.1)
Sodium: 139 mmol/L (ref 135–145)

## 2015-10-22 LAB — CBC
HCT: 36.9 % (ref 35.0–47.0)
Hemoglobin: 12.2 g/dL (ref 12.0–16.0)
MCH: 25 pg — ABNORMAL LOW (ref 26.0–34.0)
MCHC: 33.1 g/dL (ref 32.0–36.0)
MCV: 75.5 fL — ABNORMAL LOW (ref 80.0–100.0)
Platelets: 316 10*3/uL (ref 150–440)
RBC: 4.89 MIL/uL (ref 3.80–5.20)
RDW: 16 % — ABNORMAL HIGH (ref 11.5–14.5)
WBC: 8 10*3/uL (ref 3.6–11.0)

## 2015-10-22 LAB — GLUCOSE, CAPILLARY
Glucose-Capillary: 189 mg/dL — ABNORMAL HIGH (ref 65–99)
Glucose-Capillary: 262 mg/dL — ABNORMAL HIGH (ref 65–99)

## 2015-10-22 MED ORDER — HYDROCODONE-ACETAMINOPHEN 5-325 MG PO TABS: 1.0000 | ORAL_TABLET | Freq: Four times a day (QID) | ORAL | Status: DC | PRN

## 2015-10-22 MED ORDER — CLINDAMYCIN HCL 300 MG PO CAPS: 300.0000 mg | ORAL_CAPSULE | Freq: Three times a day (TID) | ORAL | Status: DC

## 2015-10-22 NOTE — Discharge Instructions (Signed)
Heart healthy and ADA diet. Activity as tolerated. F/u Dr. Cleda Mccreedy for right 3rd toe wound care. Smoking cessation.

## 2015-10-22 NOTE — Progress Notes (Addendum)
Inpatient Diabetes Program Recommendations  AACE/ADA: New Consensus Statement on Inpatient Glycemic Control (2015)  Target Ranges:  Prepandial:   less than 140 mg/dL      Peak postprandial:   less than 180 mg/dL (1-2 hours)      Critically ill patients:  140 - 180 mg/dL   Lab Results  Component Value Date   GLUCAP 189* 10/22/2015   HGBA1C 12.5* 10/03/2015    Review of Glycemic Control   Results for CHOLE, RION (MRN [id number]) as of 10/22/2015 11:03  Ref. Range 10/21/2015 17:57 10/21/2015 20:52 10/22/2015 05:25 10/22/2015 07:34  Glucose-Capillary Latest Ref Range: 65-99 mg/dL 145 (H) 231 (H)  189 (H)    Diabetes history: Type 2 diabetes Outpatient Diabetes medications: Lantus 40 units bid, Metformin 1000 mg bid Current orders for Inpatient glycemic control: Lantus 40 units bid, Novolog sensitive tid with meals and HS  Inpatient Diabetes Program Recommendations:    A1C continues to be in the 12.5% range. Please consider increasing Novolog correction (0-9 units tid) to Resistant correction scale (0-20 units) tid and hs.   Gentry Fitz, RN, BA, MHA, CDE Diabetes Coordinator Inpatient Diabetes Program  [phone] (Team Pager) [phone] ([place]) 10/22/2015 11:02 AM

## 2015-10-22 NOTE — Care Management (Signed)
Per Dr. Sharlotte Alamo patient can be heel touch weight bearing to left foot with op-shoe (elevated heel). Spoke with PT.

## 2015-10-22 NOTE — Care Management (Signed)
Per PT walker will not be needed. No further RNCM needs.

## 2015-10-22 NOTE — Progress Notes (Signed)
Patient being discharged to home. Rx and discharge instructions given. Patient states that she doesn't" have $3" for her antibiotics. Nurse asked CM if any resources to help with that and was told that pt is on medicaid/medicare and there isn't anything more they can do. IV removed and belongings packed. Friend here to take pt home.

## 2015-10-22 NOTE — Discharge Summary (Signed)
[facility] NAME: Melinda [name]    MR#:  [id number]  DATE OF BIRTH:  01/26/1956  DATE OF ADMISSION:  10/21/2015 ADMITTING PHYSICIAN: Demetrios Loll, MD  DATE OF DISCHARGE: 10/22/2015 PRIMARY CARE PHYSICIAN: Elba Barman, MD    ADMISSION DIAGNOSIS:  Osteomyelytis   DISCHARGE DIAGNOSIS:   Left third toe osteomyelitis SECONDARY DIAGNOSIS:   Past Medical History  Diagnosis Date  . HTN (hypertension)   . Tobacco abuse   . Anxiety   . Peripheral neuropathy ([place])   . Obesity   . Coronary artery disease     a. NSTEMI 06/2014; b. 4 vessel CABG 06/08/2014 LIMA to LAD, SVG to Diagonal, SVG to OM, & SVG to PDA  . Ischemic cardiomyopathy     a. echo 06/04/14 EF 40-45%, HK of entire inferolateral and inferior myocardium c.w infarct of RCA/LCx, GR2DD, mild MR  . IDDM (insulin dependent diabetes mellitus) ([place])   . GERD (gastroesophageal reflux disease)   . OA (osteoarthritis) of knee   . Osteomyelitis [facility])     HOSPITAL COURSE:   Left third toe osteomyelitis Started vancomycin and Zosyn, s/p amputation last night by Dr. Cleda Mccreedy. Continue clindamycin and discharge to home today per Dr. Cleda Mccreedy.  Diabetes. on sliding scale continue Lantus 40 units twice a day.  Hypertension, reume Lopressor and lisinopril.  Tobacco abuse. Smoking cessation was counseled for 3 minutes, given nicotine patch.  DISCHARGE CONDITIONS:   Stable, discharge to home today.  CONSULTS OBTAINED:  Treatment Team:  Sharlotte Alamo, DPM  DRUG ALLERGIES:  No Known Allergies  DISCHARGE MEDICATIONS:   Current Discharge Medication List    CONTINUE these medications which have CHANGED   Details  clindamycin (CLEOCIN) 300 MG capsule Take 1 capsule (300 mg total) by mouth 3 (three) times daily. Qty: 15 capsule, Refills: 0    HYDROcodone-acetaminophen (NORCO) 5-325 MG tablet Take 1 tablet by mouth every 6 (six) hours as needed for moderate pain. Qty: 12 tablet,  Refills: 0      CONTINUE these medications which have NOT CHANGED   Details  albuterol (PROVENTIL) (2.5 MG/3ML) 0.083% nebulizer solution Take 3 mLs (2.5 mg total) by nebulization every 2 (two) hours as needed for wheezing. Qty: 75 mL, Refills: 12    aspirin EC 81 MG EC tablet Take 1 tablet (81 mg total) by mouth daily. Qty: 30 tablet, Refills: 0    atorvastatin (LIPITOR) 80 MG tablet Take 1 tablet (80 mg total) by mouth daily at 6 PM. Qty: 30 tablet, Refills: 11    clopidogrel (PLAVIX) 75 MG tablet Take 1 tablet (75 mg total) by mouth daily. Qty: 30 tablet, Refills: 11   Associated Diagnoses: Coronary artery disease involving native coronary artery of native heart without angina pectoris    gabapentin (NEURONTIN) 800 MG tablet Take 1 tablet (800 mg total) by mouth 3 (three) times daily. Qty: 30 tablet, Refills: 0    insulin aspart (NOVOLOG) 100 UNIT/ML injection CBG < 70: implement hypoglycemia protocol CBG 70 - 120: 0 units CBG 121 - 150: 3 units CBG 151 - 200: 4 units CBG 201 - 250: 7 units CBG 251 - 300: 11 units CBG 301 - 350: 15 units CBG 351 - 400: 20 units Qty: 10 mL, Refills: 11    insulin glargine (LANTUS) 100 UNIT/ML injection Inject 0.4 mLs (40 Units total) into the skin 2 (two) times daily. Qty: 10 mL, Refills: 11    lisinopril (PRINIVIL,ZESTRIL) 40 MG  tablet Take 1 tablet (40 mg total) by mouth daily. Qty: 30 tablet, Refills: 11    metoprolol tartrate (LOPRESSOR) 25 MG tablet Take 1 tablet (25 mg total) by mouth 2 (two) times daily. Qty: 60 tablet, Refills: 11    ondansetron (ZOFRAN ODT) 4 MG disintegrating tablet Take 1 tablet (4 mg total) by mouth every 8 (eight) hours as needed for nausea or vomiting. Qty: 15 tablet, Refills: 0      STOP taking these medications     oxyCODONE (OXY IR/ROXICODONE) 5 MG immediate release tablet          DISCHARGE INSTRUCTIONS:    If you experience worsening of your admission symptoms, develop shortness of breath,  life threatening emergency, suicidal or homicidal thoughts you must seek medical attention immediately by calling 911 or calling your MD immediately  if symptoms less severe.  You Must read complete instructions/literature along with all the possible adverse reactions/side effects for all the Medicines you take and that have been prescribed to you. Take any new Medicines after you have completely understood and accept all the possible adverse reactions/side effects.   Please note  You were cared for by a hospitalist during your hospital stay. If you have any questions about your discharge medications or the care you received while you were in the hospital after you are discharged, you can call the unit and asked to speak with the hospitalist on call if the hospitalist that took care of you is not available. Once you are discharged, your primary care physician will handle any further medical issues. Please note that NO REFILLS for any discharge medications will be authorized once you are discharged, as it is imperative that you return to your primary care physician (or establish a relationship with a primary care physician if you do not have one) for your aftercare needs so that they can reassess your need for medications and monitor your lab values.    Today   SUBJECTIVE    Left foot pain.  VITAL SIGNS:  Blood pressure 130/75, pulse 85, temperature 98.1 F (36.7 C), temperature source Oral, resp. rate 18, height 5\' 8"  (1.727 m), weight 250 lb (113.399 kg), SpO2 97 %.  I/O:   Intake/Output Summary (Last 24 hours) at 10/22/15 1250 Last data filed at 10/22/15 0900  Gross per 24 hour  Intake   2340 ml  Output     20 ml  Net   2320 ml    PHYSICAL EXAMINATION:  GENERAL:  60 y.o.-year-old patient lying in the bed with no acute distress.  EYES: Pupils equal, round, reactive to light and accommodation. No scleral icterus. Extraocular muscles intact.  HEENT: Head atraumatic, normocephalic.  Oropharynx and nasopharynx clear.  NECK:  Supple, no jugular venous distention. No thyroid enlargement, no tenderness.  LUNGS: Normal breath sounds bilaterally, no wheezing, rales,rhonchi or crepitation. No use of accessory muscles of respiration.  CARDIOVASCULAR: S1, S2 normal. No murmurs, rubs, or gallops.  ABDOMEN: Soft, non-tender, non-distended. Bowel sounds present. No organomegaly or mass.  EXTREMITIES: No pedal edema, cyanosis, or clubbing. Left foot in dressing. NEUROLOGIC: Cranial nerves II through XII are intact. Muscle strength 5/5 in all extremities. Sensation intact. Gait not checked.  PSYCHIATRIC: The patient is alert and oriented x 3.  SKIN: No obvious rash, lesion, or ulcer.   DATA REVIEW:   CBC  Recent Labs Lab 10/22/15 0525  WBC 8.0  HGB 12.2  HCT 36.9  PLT 316    Chemistries   Recent  Labs Lab 10/22/15 0525  NA 139  K 4.0  CL 103  CO2 28  GLUCOSE 195*  BUN 24*  CREATININE 1.57*  CALCIUM 8.5*    Cardiac Enzymes No results for input(s): TROPONINI in the last 168 hours.  Microbiology Results  Results for orders placed or performed during the hospital encounter of 10/21/15  Aerobic/Anaerobic Culture (surgical/deep wound)     Status: None (Preliminary result)   Collection Time: 10/21/15  5:23 PM  Result Value Ref Range Status   Specimen Description BONE  Final   Special Requests LEFT THIRD TOE  Final   Gram Stain   Final    FEW WBC PRESENT,BOTH PMN AND MONONUCLEAR ABUNDANT GRAM POSITIVE COCCI IN PAIRS FEW GRAM VARIABLE ROD Performed at [facility]    Culture PENDING  Incomplete   Report Status PENDING  Incomplete    RADIOLOGY:  No results found.      Management plans discussed with the patient, family and they are in agreement.  CODE STATUS:     Code Status Orders        Start     Ordered   10/21/15 1554  Full code   Continuous     10/21/15 1554    Code Status History    Date Active Date Inactive Code Status Order ID  Comments User Context   10/02/2015  6:16 PM 10/04/2015  4:54 PM Full Code [id number]  Demetrios Loll, MD Inpatient   06/08/2014 12:58 PM 06/13/2014  6:54 PM Full Code [id number]  Nani Skillern, [name] Inpatient   06/03/2014  5:18 PM 06/08/2014 12:58 PM Full Code [id number]  Rise Mu, [name] Inpatient      TOTAL TIME TAKING CARE OF THIS PATIENT:  33 minutes.    Demetrios Loll M.D on 10/22/2015 at 12:50 PM  Between 7am to 6pm - Pager - [phone]  After 6pm go to [email or web address] - password [email or web address] [facility]  Office  [phone]  CC: Primary care physician; Elba Barman, MD

## 2015-10-22 NOTE — Evaluation (Signed)
Physical Therapy Evaluation Patient Details Name: Melinda [name] MRN: [id number] DOB: November 10, 1955 Today's Date: 10/22/2015   History of Present Illness  Pt s/p L 3rd toe amputation.    Clinical Impression  Pt is able to ambulate with wedge boot and no AD, she is easily able to negotiate up/down steps with single rail and has no safety issues or LOBs.  She shows good confidence with ambulation and overall is safe and should be able to return home with family that is able to assist with errands, groceries, etc.    Follow Up Recommendations No PT follow up    Equipment Recommendations  None recommended by PT (issued wedge boot, does not like NWBing hopping with RW)    Recommendations for Other Services       Precautions / Restrictions Precautions Precautions: Fall Required Braces or Orthoses:  (heel-off wedge post-op boot) Restrictions Weight Bearing Restrictions: Yes LLE Weight Bearing: Non weight bearing Other Position/Activity Restrictions: heel WBing with wedge boot      Mobility  Bed Mobility Overal bed mobility: Independent             General bed mobility comments: Pt able to sit at EOB and don shoes/post-op boot  Transfers Overall transfer level: Independent Equipment used: None             General transfer comment: Pt able to rise without assist or AD wearing wedge boot, able to rise and maintain NWBing with walker  Ambulation/Gait Ambulation/Gait assistance: Supervision Ambulation Distance (Feet): 30 Feet Assistive device: None;Rolling walker (2 wheeled)       General Gait Details: Pt able to ambulate w/o AD using wedge boot with no LOBs and slow but safe cadecne.  She is also able to do some NWBing hopping with FWW but likes using the boot better.  Stairs Stairs: Yes Stairs assistance: Modified independent (Device/Increase time) Stair Management: One rail Left Number of Stairs: 4 General stair comments: Pt able to ascend steps sideways with  single rail with wedge boot donned.    Wheelchair Mobility    Modified Rankin (Stroke Patients Only)       Balance Overall balance assessment: Independent                                           Pertinent Vitals/Pain Pain Assessment:  (minimal post-op tenderness in L foot)    Home Living Family/patient expects to be discharged to:: Private residence Living Arrangements: Alone     Home Access: Stairs to enter Entrance Stairs-Rails: Psychiatric nurse of Steps: 5          Prior Function Level of Independence: Independent         Comments: Pt able to drive and stay active     Hand Dominance        Extremity/Trunk Assessment   Upper Extremity Assessment: Overall WFL for tasks assessed           Lower Extremity Assessment: Overall WFL for tasks assessed (bandage limits L ankle ROM)         Communication   Communication: No difficulties  Cognition Arousal/Alertness: Awake/alert Behavior During Therapy: WFL for tasks assessed/performed Overall Cognitive Status: Within Functional Limits for tasks assessed                      General Comments      Exercises  Assessment/Plan    PT Assessment Patent does not need any further PT services  PT Diagnosis Difficulty walking   PT Problem List    PT Treatment Interventions     PT Goals (Current goals can be found in the Care Plan section) Acute Rehab PT Goals Patient Stated Goal: go home    Frequency     Barriers to discharge        Co-evaluation               End of Session Equipment Utilized During Treatment: Gait belt Activity Tolerance: Patient tolerated treatment well Patient left: with chair alarm set;with call bell/phone within reach Nurse Communication: Mobility status         Time: [id number] PT Time Calculation (min) (ACUTE ONLY): 30 min   Charges:   PT Evaluation $PT Eval Low Complexity: 1 Procedure     PT G  Codes:        Kreg Shropshire, DPT 10/22/2015, 1:20 PM

## 2015-10-22 NOTE — Progress Notes (Signed)
1 Day Post-Op  Subjective: Patient seen. Some pain overnight but overall this morning doing well.  Objective: Vital signs in last 24 hours: Temp:  [97.5 F (36.4 C)-98.7 F (37.1 C)] 98.1 F (36.7 C) (06/23 0732) Pulse Rate:  [85-103] 85 (06/23 0732) Resp:  [16-25] 18 (06/23 0732) BP: (113-159)/(60-84) 130/75 mmHg (06/23 0732) SpO2:  [93 %-100 %] 97 % (06/23 0732) Weight:  [113.399 kg (250 lb)] 113.399 kg (250 lb) (06/22 1600) Last BM Date: 10/20/15  Intake/Output from previous day: 06/22 0701 - 06/23 0700 In: 1980 [P.O.:30; I.V.:1300; IV Piggyback:650] Out: 20 [Blood:20] Intake/Output this shift:    The bandage on the left foot is dry and intact. No evidence of any strike through.  Lab Results:   Recent Labs  10/21/15 1609 10/22/15 0525  WBC 11.7* 8.0  HGB 13.4 12.2  HCT 40.8 36.9  PLT 328 316   BMET  Recent Labs  10/21/15 1609 10/22/15 0525  NA 139 139  K 4.2 4.0  CL 103 103  CO2 24 28  GLUCOSE 174* 195*  BUN 20 24*  CREATININE 1.40* 1.57*  CALCIUM 9.0 8.5*   PT/INR No results for input(s): LABPROT, INR in the last 72 hours. ABG No results for input(s): PHART, HCO3 in the last 72 hours.  Invalid input(s): PCO2, PO2  Studies/Results: No results found.  Anti-infectives: Anti-infectives    Start     Dose/Rate Route Frequency Ordered Stop   10/22/15 1400  vancomycin (VANCOCIN) 1,500 mg in sodium chloride 0.9 % 500 mL IVPB     1,500 mg 250 mL/hr over 120 Minutes Intravenous Every 24 hours 10/21/15 1926     10/21/15 1630  piperacillin-tazobactam (ZOSYN) IVPB 3.375 g     3.375 g 12.5 mL/hr over 240 Minutes Intravenous Every 8 hours 10/21/15 1620     10/21/15 1630  vancomycin (VANCOCIN) IVPB 1000 mg/200 mL premix  Status:  Discontinued     1,000 mg 200 mL/hr over 60 Minutes Intravenous  Once 10/21/15 1624 10/21/15 1626   10/21/15 1630  vancomycin (VANCOCIN) 1,500 mg in sodium chloride 0.9 % 500 mL IVPB     1,500 mg 250 mL/hr over 120 Minutes  Intravenous  Once 10/21/15 1626 10/21/15 2140      Assessment/Plan: s/p Procedure(s): AMPUTATION TOE (Left) Assessment: Stable status post amputation left third toe.   Plan: Dressing was left intact. Instructed the patient to keep the bandage clean and dry and do not remove. She may be full weightbearing in her surgical shoe but was instructed to walk only with pressure on the heel for now. Patient should be stable for discharge on oral antibiotics. I will follow up with the patient next week on Tuesday or Wednesday  LOS: 1 day    [name] 10/22/2015

## 2015-10-22 NOTE — Progress Notes (Signed)
Pharmacy Antibiotic Note  Melinda [name] is a 60 y.o. female admitted on 10/21/2015 with left third toe osteomyelitis.  Pharmacy has been consulted for Vancomycin and Zosyn dosing.  Plan: Zosyn 3.375g IV q8h (4 hour infusion).   Baseline labs: SCr 1.40, CrCl 42 ml/min Ke 0.039, half life 18h, Vd 59 L Will continue with vancomycin 1500 mg IV q24h to start 18 h after initial one time dose for stacked dosing. Trough 6/26 at 1300. Will need to closely monitor renal function as pt at risk for accumulation. Goal trough 15-20 mcg/ml.   Height: 5\' 8"  (172.7 cm) Weight: 250 lb (113.399 kg) IBW/kg (Calculated) : 63.9  Temp (24hrs), Avg:98.2 F (36.8 C), Min:97.5 F (36.4 C), Max:98.7 F (37.1 C)   Recent Labs Lab 10/21/15 1609 10/22/15 0525  WBC 11.7* 8.0  CREATININE 1.40* 1.57*    Estimated Creatinine Clearance: 49.1 mL/min (by C-G formula based on Cr of 1.57).    No Known Allergies  Antimicrobials this admission: Vanc  6/22 >>  Zosyn  6/22 >>   Dose adjustments this admission:  Microbiology results:  Thank you for allowing pharmacy to be a part of this patient's care.  Lenis Noon, PharmD 10/22/2015 12:53 PM

## 2015-10-22 NOTE — Care Management Note (Signed)
Case Management Note  Patient Details  Name: Melinda [name] MRN: [id number] Date of Birth: 04/23/1956  Subjective/Objective:                   Met with patient to discuss discharge planning. Patient states she is from home alone and typically able to drive. There is concern per RN with patient ambulatory status after taking her to the bathroom. Patient has not DME at home. Her PCP is now with [facility] appt 10/29/15. She has glucometer that works. She denies difficulty paying for meds. Patient is Medicaid only which will not cover HHPT. Action/Plan: Rolling walker requested from [place] [facility]. PT also requested.    Expected Discharge Date:                  Expected Discharge Plan:     In-House Referral:     Discharge planning Services  CM Consult  Post Acute Care Choice:  Durable Medical Equipment, Home Health Choice offered to:  Patient  DME Arranged:    DME Agency:     HH Arranged:    [place] Agency:     Status of Service:  In process, will continue to follow  If discussed at Long Length of Stay Meetings, dates discussed:    Additional Comments:  Marshell Garfinkel, RN 10/22/2015, 10:02 AM

## 2015-10-22 NOTE — Anesthesia Postprocedure Evaluation (Signed)
Anesthesia Post Note  Patient: Melinda [name]  Procedure(s) Performed: Procedure(s) (LRB): AMPUTATION TOE (Left)  Patient location during evaluation: PACU Anesthesia Type: General Level of consciousness: awake and alert Pain management: pain level controlled Vital Signs Assessment: post-procedure vital signs reviewed and stable Respiratory status: spontaneous breathing, nonlabored ventilation, respiratory function stable and patient connected to nasal cannula oxygen Cardiovascular status: blood pressure returned to baseline and stable Postop Assessment: no signs of nausea or vomiting Anesthetic complications: no    Last Vitals:  Filed Vitals:   10/21/15 2338 10/22/15 0516  BP: 122/64 125/71  Pulse: 92 85  Temp: 36.6 C 36.6 C  Resp:      Last Pain:  Filed Vitals:   10/22/15 0517  PainSc: Asleep                 Martha Clan

## 2015-10-25 LAB — SURGICAL PATHOLOGY

## 2015-10-27 LAB — AEROBIC/ANAEROBIC CULTURE W GRAM STAIN (SURGICAL/DEEP WOUND)

## 2015-10-27 LAB — AEROBIC/ANAEROBIC CULTURE (SURGICAL/DEEP WOUND)

## 2016-01-12 ENCOUNTER — Ambulatory Visit: Payer: Medicaid Other | Admitting: Cardiovascular Disease

## 2016-01-27 ENCOUNTER — Telehealth: Payer: Self-pay | Admitting: Cardiovascular Disease

## 2016-01-27 NOTE — Telephone Encounter (Signed)
3 attempts to schedule fu from recall.  Deleting recall.  °

## 2016-03-27 ENCOUNTER — Ambulatory Visit (INDEPENDENT_AMBULATORY_CARE_PROVIDER_SITE_OTHER): Payer: Medicaid Other | Admitting: Cardiovascular Disease

## 2016-03-27 ENCOUNTER — Encounter: Payer: Self-pay | Admitting: Cardiovascular Disease

## 2016-03-27 VITALS — BP 100/60 | HR 83 | Ht 68.0 in | Wt 264.5 lb

## 2016-03-27 DIAGNOSIS — Z720 Tobacco use: Secondary | ICD-10-CM

## 2016-03-27 DIAGNOSIS — I739 Peripheral vascular disease, unspecified: Secondary | ICD-10-CM

## 2016-03-27 DIAGNOSIS — E1159 Type 2 diabetes mellitus with other circulatory complications: Secondary | ICD-10-CM

## 2016-03-27 DIAGNOSIS — I255 Ischemic cardiomyopathy: Secondary | ICD-10-CM | POA: Diagnosis not present

## 2016-03-27 DIAGNOSIS — I2510 Atherosclerotic heart disease of native coronary artery without angina pectoris: Secondary | ICD-10-CM

## 2016-03-27 DIAGNOSIS — Z794 Long term (current) use of insulin: Secondary | ICD-10-CM

## 2016-03-27 DIAGNOSIS — M869 Osteomyelitis, unspecified: Secondary | ICD-10-CM

## 2016-03-27 DIAGNOSIS — I10 Essential (primary) hypertension: Secondary | ICD-10-CM | POA: Diagnosis not present

## 2016-03-27 DIAGNOSIS — Z951 Presence of aortocoronary bypass graft: Secondary | ICD-10-CM

## 2016-03-27 MED ORDER — VARENICLINE TARTRATE 1 MG PO TABS: 1.0000 mg | ORAL_TABLET | Freq: Two times a day (BID) | ORAL | 3 refills | Status: DC

## 2016-03-27 MED ORDER — POTASSIUM CHLORIDE CRYS ER 20 MEQ PO TBCR: 20.0000 meq | EXTENDED_RELEASE_TABLET | Freq: Every day | ORAL | 3 refills | Status: DC | PRN

## 2016-03-27 MED ORDER — FUROSEMIDE 20 MG PO TABS: 20.0000 mg | ORAL_TABLET | Freq: Every day | ORAL | 3 refills | Status: DC | PRN

## 2016-03-27 NOTE — Progress Notes (Signed)
Cardiology Office Note  Date:  03/27/2016   ID:  Melinda [name], DOB Oct 26, 1955, MRN [id number]  PCP:  Elba Barman, MD   Chief Complaint  Patient presents with  . other    6 month follow up. Meds reviewed by the pt. verbally. "doing well."     HPI:  60 y.o. female with h/o CAD s/p 4 vessel CABG on 06/08/2014 secondary to NSTEMI,poorly controlled IDDM, HTN, ongoing tobacco abuse, obesity, anxiety, and peripheral neuropathy who was recently admitted to [facility] from 2/1-2/3 for NSTEMI. After undergoing cardiac cath on 2/3 that showed severe 3 vessel disease she was transferred to [facility] from 2/3-2/13 for . 4 vessel CABG (LIMA to LAD, SVG to Diagonal, SVG to OM, and SVG to PDA) She presents today for follow-up in the clinic for her coronary artery disease  In follow-up today she reports that her Legs hurt with walking Also with SOB with walking Frequently has to stop, let her legs recover, let her breathing recover before she is able to walk again Reports having recent toe amputation She is trying to do some sitting down exercises, no regular aerobic activity  hemoglobin A1c typically greater than 12, reports it is now down to 8 though this lab work is not available for our review "sugars are a lot better" though she reports that she "messed up" over Thanksgiving Some sinus pressure, pain right neck, right side of her face  Long discussion concerning smoking cessation Willing to try Chantix if it is covered by Medicaid Denies any significant chest pain Also reports having leg swelling, abdominal bloating, finger swelling Symptoms presented over the past several weeks, worse recently Significant dietary indiscretion Denies any cough  EKG on today's visit shows normal sinus rhythm with rate 85 bpm, ST and T wave abnormality inferior leads, PVCs noted, unchanged from previous EKG   other past medical history   presented to [facility] ED on 2/1 with headache, neck pain, nasal pain, chest  tightness, nausea, and just overall did not feel well. Work up in the [facility] ED confirmed sinus infection, which she started on Augmentin for. She was also found to be hypotensive with BP 85/51. She received IV fluids with improvement in her BP to 120s/80s. Negative abdominal CT. She did have an elevated troponin at <0.02->1.30-->3.60-->7.80-->13.26-->23.00-->30.00. She was placed on a heparin gtt. Cardiac cath showed 95% stenosis mLAD, occlusion ostial OM1, 70% stenosis LCx, 95% stenosis mRCA, EF 45%. She underwent successful cabg on 06/08/2014 at [facility]. She tolerated this procedure well. Echo at [facility] showed EF 40-45%, HK of entire inferior and inferolateral wall c/w infarction of RCA or LCx, GR2DD, and mild TR. She was discharged on Lopressor 12.5 mg bid with labile BP. She was not started on an ACEi 2/2 labile BP. Hgb was stable at discharge. She was continued on Lasix 40 mg daily. She was advised to follow up with her PCP for her blood sugars (pre op A1C 10.5%).    PMH:   has a past medical history of Anxiety; Coronary artery disease; GERD (gastroesophageal reflux disease); HTN (hypertension); IDDM (insulin dependent diabetes mellitus) ([name]); Ischemic cardiomyopathy; OA (osteoarthritis) of knee; Obesity; Osteomyelitis ([place]); Peripheral neuropathy ([name]); and Tobacco abuse.  PSH:    Past Surgical History:  Procedure Laterality Date  . AMPUTATION TOE Left 10/21/2015   Procedure: AMPUTATION TOE;  Surgeon: Sharlotte Alamo, DPM;  Location: [facility] ORS;  Service: Podiatry;  Laterality: Left;  . BREAST BIOPSY    . CARDIAC CATHETERIZATION  06/2014  95% stenosis mLAD, occlusion ostial OM1, 70% stenosis LCx, 95% stenosis mRCA, EF 45%.  . CORONARY ARTERY BYPASS GRAFT N/A 06/08/2014   Procedure: CORONARY ARTERY BYPASS GRAFTING (CABG);  Surgeon: Grace Isaac, MD;  Location: [place];  Service: Open Heart Surgery;  Laterality: N/A;  Times 4 using left internal mammary artery to LAD artery and endoscopically harvested bilateral  saphenous vein to Obtuse Marginal, Diagonal and Posterior Descending coronary arteries.  . CT ABD W & PELVIS WO CM  06/2014   nl liver, gallbladder, spleen, mild diverticular changes, no bowel wall inflammation, appendix nl, no hernia, no other sig abnormalities  . TEE WITHOUT CARDIOVERSION N/A 06/08/2014   Procedure: TRANSESOPHAGEAL ECHOCARDIOGRAM (TEE);  Surgeon: Grace Isaac, MD;  Location: [place];  Service: Open Heart Surgery;  Laterality: N/A;  . TOE AMPUTATION Right 12/2011   rt middle toe  . US ECHOCARDIOGRAPHY  06/2014   EF 50-55%, HK of inf/post/inferolat walls, Ao sclerosis    Current Outpatient Prescriptions  Medication Sig Dispense Refill  . albuterol (PROVENTIL) (2.5 MG/3ML) 0.083% nebulizer solution Take 3 mLs (2.5 mg total) by nebulization every 2 (two) hours as needed for wheezing. 75 mL 12  . aspirin EC 81 MG EC tablet Take 1 tablet (81 mg total) by mouth daily. 30 tablet 0  . atorvastatin (LIPITOR) 80 MG tablet Take 1 tablet (80 mg total) by mouth daily at 6 PM. 30 tablet 11  . clindamycin (CLEOCIN) 300 MG capsule Take 1 capsule (300 mg total) by mouth 3 (three) times daily. 15 capsule 0  . clopidogrel (PLAVIX) 75 MG tablet Take 1 tablet (75 mg total) by mouth daily. 30 tablet 11  . gabapentin (NEURONTIN) 300 MG capsule Take 900 mg by mouth 3 (three) times daily.    Marland Kitchen HYDROcodone-acetaminophen (NORCO) 5-325 MG tablet Take 1 tablet by mouth every 6 (six) hours as needed for moderate pain. 12 tablet 0  . insulin aspart (NOVOLOG) 100 UNIT/ML injection CBG < 70: implement hypoglycemia protocol CBG 70 - 120: 0 units CBG 121 - 150: 3 units CBG 151 - 200: 4 units CBG 201 - 250: 7 units CBG 251 - 300: 11 units CBG 301 - 350: 15 units CBG 351 - 400: 20 units 10 mL 11  . insulin glargine (LANTUS) 100 UNIT/ML injection Inject 0.4 mLs (40 Units total) into the skin 2 (two) times daily. (Patient taking differently: Inject 55 Units into the skin 2 (two) times daily. ) 10 mL 11  .  lisinopril (PRINIVIL,ZESTRIL) 40 MG tablet Take 1 tablet (40 mg total) by mouth daily. 30 tablet 11  . metoprolol tartrate (LOPRESSOR) 25 MG tablet Take 1 tablet (25 mg total) by mouth 2 (two) times daily. 60 tablet 11  . furosemide (LASIX) 20 MG tablet Take 1 tablet (20 mg total) by mouth daily as needed. 30 tablet 3  . potassium chloride SA (K-DUR,KLOR-CON) 20 MEQ tablet Take 1 tablet (20 mEq total) by mouth daily as needed. 30 tablet 3  . varenicline (CHANTIX CONTINUING MONTH PAK) 1 MG tablet Take 1 tablet (1 mg total) by mouth 2 (two) times daily. 60 tablet 3   No current facility-administered medications for this visit.      Allergies:   Patient has no known allergies.   Social History:  The patient  reports that she has been smoking Cigarettes.  She has a 20.00 pack-year smoking history. She has never used smokeless tobacco. She reports that she does not drink alcohol or use drugs.  Family History:   family history includes CAD in her father and mother; Hypertension in her mother.    Review of Systems: Review of Systems  Constitutional: Positive for malaise/fatigue.  HENT: Positive for sinus pain.   Respiratory: Positive for cough.   Cardiovascular: Negative.   Gastrointestinal: Negative.   Musculoskeletal: Negative.        Claudication pain in her legs  Neurological: Negative.   Psychiatric/Behavioral: Negative.   All other systems reviewed and are negative.    PHYSICAL EXAM: VS:  BP 100/60 (BP Location: Left Arm, Patient Position: Sitting, Cuff Size: Large)   Pulse 83   Ht 5\' 8"  (1.727 m)   Wt 264 lb 8 oz (120 kg)   BMI 40.22 kg/m  , BMI Body mass index is 40.22 kg/m. GEN: Well nourished, well developed, in no acute distress  HEENT: normal  Neck: no JVD, carotid bruits, or masses Cardiac: RRR; no murmurs, rubs, or gallops,no edema  Respiratory:  clear to auscultation bilaterally, normal work of breathing GI: soft, nontender, nondistended, + BS MS: no deformity  or atrophy  Skin: warm and dry, no rash Neuro:  Strength and sensation are intact Psych: euthymic mood, full affect    Recent Labs: 10/02/2015: ALT 14 10/22/2015: BUN 24; Creatinine, Ser 1.57; Hemoglobin 12.2; Platelets 316; Potassium 4.0; Sodium 139    Lipid Panel Lab Results  Component Value Date   CHOL 94 06/02/2014   HDL 39 (L) 06/02/2014   LDLCALC 31 06/02/2014   TRIG 122 06/02/2014      Wt Readings from Last 3 Encounters:  03/27/16 264 lb 8 oz (120 kg)  10/21/15 250 lb (113.4 kg)  10/02/15 240 lb 1.6 oz (108.9 kg)       ASSESSMENT AND PLAN:  Coronary artery disease involving native coronary artery of native heart without angina pectoris - Plan: EKG 12-Lead, VAS [name] LOWER EXTREMITY ARTERIAL DUPLEX, VAS [name] ABI WITH/WO TBI Connie with no symptoms of angina though she does have shortness of breath on exertion If symptoms get worse, may need ischemia workup  Essential hypertension - Plan: EKG 12-Lead, VAS [name] LOWER EXTREMITY ARTERIAL DUPLEX, VAS [name] ABI WITH/WO TBI Blood pressure is well controlled on today's visit. No changes made to the medications.  Ischemic cardiomyopathy - Plan: EKG 12-Lead, VAS [name] LOWER EXTREMITY ARTERIAL DUPLEX, VAS [name] ABI WITH/WO TBI  Claudication (HCC) - Plan: VAS [name] LOWER EXTREMITY ARTERIAL DUPLEX, VAS [name] ABI WITH/WO TBI Lower extremity arterial duplex ordered for claudication symptoms  S/P CABG x 4 Currently with no symptoms of angina. No further workup at this time. Continue current medication regimen.  Type 2 diabetes mellitus with other circulatory complication, with long-term current use of insulin ([place]) We have encouraged continued exercise, careful diet management in an effort to lose weight.  significant dietary indiscretion  Tobacco abuse After long discussion, she is willing to try Chantix If unable to afford this, would try Wellbutrin  Toe osteomyelitis, left ([place]) She reports having recent toe amputation High risk of PAD.  Studies ordered today Long discussion with her today about need to quit smoking  Numerous issues discussed today as detailed above All testing above discussed with her in detail  Total encounter time more than 45 minutes  Greater than 50% was spent in counseling and coordination of care with the patient   Disposition:   F/U  6 months   Orders Placed This Encounter  Procedures  . EKG 12-Lead     Signed, Esmond Plants, M.D.,  Ph.D. 03/27/2016  [place], [place]

## 2016-03-27 NOTE — Patient Instructions (Addendum)
Medication Instructions:   Please start chantix slowly twice a day  Please take lasix with potassium  As needed for bloating, weight gain, finger swelling  Labwork:  No new labs needed  Testing/Procedures:  We will order ABI, lower extremity arterial dopplers for claudication  I recommend watching educational videos on topics of interest to you at:       [email or web address]  Enter code: HEARTCARE    Follow-Up: It was a pleasure seeing you in the office today. Please call us if you have new issues that need to be addressed before your next appt.  [phone]  Your physician wants you to follow-up in: 6 months.  You will receive a reminder letter in the mail two months in advance. If you don't receive a letter, please call our office to schedule the follow-up appointment.  If you need a refill on your cardiac medications before your next appointment, please call your pharmacy.      Ankle-Brachial Index Test Introduction The ankle-brachial index (ABI) test is used to find peripheral vascular disease (PVD). PVD is also known as peripheral arterial disease (PAD). PVD is the blocking or hardening of the arteries anywhere within the circulatory system beyond the heart. PVD is caused by cholesterol deposits in your blood vessels (atherosclerosis). These deposits cause arteries to narrow. The delivery of oxygen to your tissues is impaired as a result. This can cause muscle pain and fatigue. This is called claudication. PVD means there may also be buildup of cholesterol in your:  Heart. This increases the risk of heart attacks.  Brain. This increases the risk of strokes. The ankle-brachial index test measures the blood flow in your arms and legs. This test also determines if blood vessels in your leg are narrowed by cholesterol deposits. There are additional causes of a reduced ankle-brachial index, such as inflammation of vessels or a clot in the vessels. However, these are much less  common than narrowing due to cholesterol deposits. What is being tested? The test is done while you are lying down and resting. Measurements are taken of the systolic pressure:  In your arm (brachial).  In your ankle at several points along your leg. Systolic pressure is the pressure inside your arteries when your heart pumps. The measurements are taken several times on both sides. Then, the highest systolic pressure of the ankle is divided by the highest brachial systolic pressure. The result is the ankle-brachial pressure ratio, or ABI. Sometimes this test is repeated after you have exercised on a treadmill for five minutes. You may have leg pain during the exercise portion of the test if you suffer from PAD. If the index number drops after exercise, this may show that PAD is present. A normal ABI ratio is between 0.9 and 1.4. A value below 0.9 is considered abnormal. This information is not intended to replace advice given to you by your health care provider. Make sure you discuss any questions you have with your health care provider. Document Released: 04/21/2004 Document Revised: 09/23/2015 Document Reviewed: 11/21/2013  2017 Elsevier  Steps to Quit Smoking Smoking tobacco can be bad for your health. It can also affect almost every organ in your body. Smoking puts you and people around you at risk for many serious long-lasting (chronic) diseases. Quitting smoking is hard, but it is one of the best things that you can do for your health. It is never too late to quit. What are the benefits of quitting smoking? When you quit smoking, you lower  your risk for getting serious diseases and conditions. They can include:  Lung cancer or lung disease.  Heart disease.  Stroke.  Heart attack.  Not being able to have children (infertility).  Weak bones (osteoporosis) and broken bones (fractures). If you have coughing, wheezing, and shortness of breath, those symptoms may get better when you  quit. You may also get sick less often. If you are pregnant, quitting smoking can help to lower your chances of having a baby of low birth weight. What can I do to help me quit smoking? Talk with your doctor about what can help you quit smoking. Some things you can do (strategies) include:  Quitting smoking totally, instead of slowly cutting back how much you smoke over a period of time.  Going to in-person counseling. You are more likely to quit if you go to many counseling sessions.  Using resources and support systems, such as:  Online chats with a Social worker.  Phone quitlines.  Printed Furniture conservator/restorer.  Support groups or group counseling.  Text messaging programs.  Mobile phone apps or applications.  Taking medicines. Some of these medicines may have nicotine in them. If you are pregnant or breastfeeding, do not take any medicines to quit smoking unless your doctor says it is okay. Talk with your doctor about counseling or other things that can help you. Talk with your doctor about using more than one strategy at the same time, such as taking medicines while you are also going to in-person counseling. This can help make quitting easier. What things can I do to make it easier to quit? Quitting smoking might feel very hard at first, but there is a lot that you can do to make it easier. Take these steps:  Talk to your family and friends. Ask them to support and encourage you.  Call phone quitlines, reach out to support groups, or work with a Social worker.  Ask people who smoke to not smoke around you.  Avoid places that make you want (trigger) to smoke, such as:  Bars.  Parties.  Smoke-break areas at work.  Spend time with people who do not smoke.  Lower the stress in your life. Stress can make you want to smoke. Try these things to help your stress:  Getting regular exercise.  Deep-breathing exercises.  Yoga.  Meditating.  Doing a body scan. To do this, close  your eyes, focus on one area of your body at a time from head to toe, and notice which parts of your body are tense. Try to relax the muscles in those areas.  Download or buy apps on your mobile phone or tablet that can help you stick to your quit plan. There are many free apps, such as QuitGuide from the [employer] Office manager for Disease Control and Prevention). You can find more support from smokefree.gov and other websites. This information is not intended to replace advice given to you by your health care provider. Make sure you discuss any questions you have with your health care provider. Document Released: 02/11/2009 Document Revised: 12/14/2015 Document Reviewed: 09/01/2014 Elsevier Interactive Patient Education  2017 [place].  Smoking Hazards Smoking cigarettes is extremely bad for your health. Tobacco smoke has over 200 known poisons in it. It contains the poisonous gases nitrogen oxide and carbon monoxide. There are over 60 chemicals in tobacco smoke that cause cancer. Some of the chemicals found in cigarette smoke include:   Cyanide.   Benzene.   Formaldehyde.   Methanol (wood alcohol).  Acetylene (fuel used in welding torches).   Ammonia.  Even smoking lightly shortens your life expectancy by several years. You can greatly reduce the risk of medical problems for you and your family by stopping now. Smoking is the most preventable cause of death and disease in our society. Within days of quitting smoking, your circulation improves, you decrease the risk of having a heart attack, and your lung capacity improves. There may be some increased phlegm in the first few days after quitting, and it may take months for your lungs to clear up completely. Quitting for 10 years reduces your risk of developing lung cancer to almost that of a nonsmoker.  WHAT ARE THE RISKS OF SMOKING? Cigarette smokers have an increased risk of many serious medical problems, including:  Lung cancer.    Lung disease (such as pneumonia, bronchitis, and emphysema).   Heart attack and chest pain due to the heart not getting enough oxygen (angina).   Heart disease and peripheral blood vessel disease.   Hypertension.   Stroke.   Oral cancer (cancer of the lip, mouth, or voice box).   Bladder cancer.   Pancreatic cancer.   Cervical cancer.   Pregnancy complications, including premature birth.   Stillbirths and smaller newborn babies, birth defects, and genetic damage to sperm.   Early menopause.   Lower estrogen level for women.   Infertility.   Facial wrinkles.   Blindness.   Increased risk of broken bones (fractures).   Senile dementia.   Stomach ulcers and internal bleeding.   Delayed wound healing and increased risk of complications during surgery. Because of secondhand smoke exposure, children of smokers have an increased risk of the following:   Sudden infant death syndrome (SIDS).   Respiratory infections.   Lung cancer.   Heart disease.   Ear infections.  WHY IS SMOKING ADDICTIVE? Nicotine is the chemical agent in tobacco that is capable of causing addiction or dependence. When you smoke and inhale, nicotine is absorbed rapidly into the bloodstream through your lungs. Both inhaled and noninhaled nicotine may be addictive.  WHAT ARE THE BENEFITS OF QUITTING?  There are many health benefits to quitting smoking. Some are:   The likelihood of developing cancer and heart disease decreases. Health improvements are seen almost immediately.   Blood pressure, pulse rate, and breathing patterns start returning to normal soon after quitting.   People who quit may see an improvement in their overall quality of life.  HOW DO YOU QUIT SMOKING? Smoking is an addiction with both physical and psychological effects, and longtime habits can be hard to change. Your health care provider can recommend:  Programs and community resources,  which may include group support, education, or therapy.  Replacement products, such as patches, gum, and nasal sprays. Use these products only as directed. Do not replace cigarette smoking with electronic cigarettes (commonly called e-cigarettes). The safety of e-cigarettes is unknown, and some may contain harmful chemicals. FOR MORE INFORMATION  American Lung Association: www.lung.org  American Cancer Society: www.cancer.org This information is not intended to replace advice given to you by your health care provider. Make sure you discuss any questions you have with your health care provider. Document Released: 05/25/2004 Document Revised: 08/09/2015 Document Reviewed: 10/07/2012 Elsevier Interactive Patient Education  2017 [place] WHAT IS SECONDHAND SMOKE? Secondhand smoke is smoke that comes from burning tobacco. It could be the smoke from a cigarette, a pipe, or a cigar. Even if you are not the one smoking,  secondhand smoke exposes you to the dangers of smoking. This is called involuntary, or passive, smoking. There are two types of secondhand smoke:  Sidestream smoke is the smoke that comes off the lighted end of a cigarette, pipe, or cigar.  This type of smoke has the highest amount of cancer-causing agents (carcinogens).  The particles in sidestream smoke are smaller. They get into your lungs more easily.  Mainstream smoke is the smoke that is exhaled by a person who is smoking.  This type of smoke is also dangerous to your health. HOW CAN SECONDHAND SMOKE AFFECT MY HEALTH? Studies show that there is no safe level of secondhand smoke. This smoke contains thousands of chemicals. At least 52 of them are known to cause cancer. Secondhand smoke can also cause many other health problems. It has been linked to:  Lung cancer.  Cancer of the voice box (larynx) or throat.  Cancer of the sinuses.  Brain cancer.  Bladder cancer.  Stomach cancer.  Breast  cancer.  White blood cell cancers (lymphoma and leukemia).  Brain and liver tumors in children.  Heart disease and stroke in adults.  Pregnancy loss (miscarriage).  Diseases in children, such as:  Asthma.  Lung infections.  Ear infections.  Sudden infant death syndrome (SIDS).  Slow growth. WHERE CAN I BE AT RISK FOR EXPOSURE TO SECONDHAND SMOKE?  For adults, the workplace is the main source of exposure to secondhand smoke.  Your workplace should have a policy separating smoking areas from nonsmoking areas.  Smoking areas should have a system for ventilating and cleaning the air.  For children, the home may be the most dangerous place for exposure to secondhand smoke.  Children who live in apartment buildings may be at risk from smoke drifting from hallways or other people's homes.  For everyone, many public places are possible sources of exposure to secondhand smoke.  These places include restaurants, shopping centers, and parks. HOW CAN I REDUCE MY RISK FOR EXPOSURE TO SECONDHAND SMOKE? The most important thing you can do is not smoke. Discourage family members from smoking. Other ways to reduce exposure for you and your family include the following:  Keep your home smoke free.  Make sure your child care providers do not smoke.  Warn your child about the dangers of smoking and secondhand smoke.  Do not allow smoking in your car. When someone smokes in a car, all the damaging chemicals from the smoke are confined in a small area.  Avoid public places where smoking is allowed. This information is not intended to replace advice given to you by your health care provider. Make sure you discuss any questions you have with your health care provider. Document Released: 05/25/2004 Document Revised: 11/25/2015 Document Reviewed: 08/01/2013 Elsevier Interactive Patient Education  2017 [employer].

## 2016-04-14 ENCOUNTER — Other Ambulatory Visit: Payer: Self-pay | Admitting: Cardiovascular Disease

## 2016-04-14 DIAGNOSIS — I739 Peripheral vascular disease, unspecified: Secondary | ICD-10-CM

## 2016-04-20 ENCOUNTER — Other Ambulatory Visit: Payer: Self-pay | Admitting: Cardiovascular Disease

## 2016-04-20 DIAGNOSIS — I739 Peripheral vascular disease, unspecified: Secondary | ICD-10-CM

## 2016-05-12 ENCOUNTER — Encounter: Payer: Self-pay | Admitting: *Deleted

## 2016-06-02 ENCOUNTER — Encounter: Payer: Self-pay | Admitting: *Deleted

## 2016-07-04 ENCOUNTER — Other Ambulatory Visit: Payer: Self-pay | Admitting: Cardiovascular Disease

## 2016-07-04 DIAGNOSIS — I739 Peripheral vascular disease, unspecified: Secondary | ICD-10-CM

## 2016-07-05 ENCOUNTER — Encounter: Payer: Self-pay | Admitting: *Deleted

## 2016-07-05 ENCOUNTER — Telehealth: Payer: Self-pay | Admitting: Cardiovascular Disease

## 2016-07-05 NOTE — Telephone Encounter (Signed)
Pt has called and cancelled her LE ART at least now 2 times and No Showed once We have been trying to get this done since 03/2016  She has not been able to keep any of those apportionments  Please advise.

## 2016-07-05 NOTE — Telephone Encounter (Signed)
Letter placed in outgoing mail for patient regarding missed appointments and instructions to give [name] a call.

## 2016-08-16 ENCOUNTER — Ambulatory Visit: Payer: Medicaid Other

## 2016-10-02 ENCOUNTER — Ambulatory Visit: Payer: Medicaid Other | Admitting: Cardiovascular Disease

## 2016-10-10 ENCOUNTER — Other Ambulatory Visit: Payer: Self-pay | Admitting: Cardiovascular Disease

## 2016-10-10 ENCOUNTER — Ambulatory Visit: Payer: Medicaid Other | Admitting: Family Medicine

## 2016-10-10 VITALS — BP 145/85 | HR 94 | Temp 98.2°F | Ht 68.5 in | Wt 249.4 lb

## 2016-10-10 DIAGNOSIS — Z029 Encounter for administrative examinations, unspecified: Secondary | ICD-10-CM | POA: Insufficient documentation

## 2016-10-10 DIAGNOSIS — G59 Mononeuropathy in diseases classified elsewhere: Secondary | ICD-10-CM

## 2016-10-10 DIAGNOSIS — I739 Peripheral vascular disease, unspecified: Secondary | ICD-10-CM

## 2016-10-10 DIAGNOSIS — S98132A Complete traumatic amputation of one left lesser toe, initial encounter: Secondary | ICD-10-CM

## 2016-10-10 DIAGNOSIS — S98131S Complete traumatic amputation of one right lesser toe, sequela: Secondary | ICD-10-CM

## 2016-10-10 DIAGNOSIS — Z794 Long term (current) use of insulin: Secondary | ICD-10-CM

## 2016-10-10 DIAGNOSIS — Z5181 Encounter for therapeutic drug level monitoring: Secondary | ICD-10-CM

## 2016-10-10 DIAGNOSIS — R109 Unspecified abdominal pain: Secondary | ICD-10-CM

## 2016-10-10 DIAGNOSIS — E1159 Type 2 diabetes mellitus with other circulatory complications: Secondary | ICD-10-CM

## 2016-10-10 DIAGNOSIS — B351 Tinea unguium: Secondary | ICD-10-CM

## 2016-10-10 DIAGNOSIS — R10A2 Flank pain, left side: Secondary | ICD-10-CM

## 2016-10-10 DIAGNOSIS — E119 Type 2 diabetes mellitus without complications: Secondary | ICD-10-CM

## 2016-10-10 DIAGNOSIS — I2510 Atherosclerotic heart disease of native coronary artery without angina pectoris: Secondary | ICD-10-CM

## 2016-10-10 DIAGNOSIS — S98139A Complete traumatic amputation of one unspecified lesser toe, initial encounter: Secondary | ICD-10-CM | POA: Insufficient documentation

## 2016-10-10 DIAGNOSIS — S98131A Complete traumatic amputation of one right lesser toe, initial encounter: Secondary | ICD-10-CM | POA: Insufficient documentation

## 2016-10-10 LAB — GLUCOSE, POCT (MANUAL RESULT ENTRY): POC Glucose: 311 mg/dl — AB (ref 70–99)

## 2016-10-10 MED ORDER — METOPROLOL TARTRATE 25 MG PO TABS: 25.0000 mg | ORAL_TABLET | Freq: Two times a day (BID) | ORAL | 1 refills | Status: DC

## 2016-10-10 MED ORDER — ATORVASTATIN CALCIUM 80 MG PO TABS: 80.0000 mg | ORAL_TABLET | Freq: Every day | ORAL | 1 refills | Status: DC

## 2016-10-10 MED ORDER — INSULIN GLARGINE 100 UNIT/ML ~~LOC~~ SOLN: 60.0000 [IU] | Freq: Two times a day (BID) | SUBCUTANEOUS | 1 refills | Status: DC

## 2016-10-10 MED ORDER — VARENICLINE TARTRATE 0.5 MG X 11 & 1 MG X 42 PO MISC: ORAL | 0 refills | Status: DC

## 2016-10-10 MED ORDER — CLOPIDOGREL BISULFATE 75 MG PO TABS: 75.0000 mg | ORAL_TABLET | Freq: Every day | ORAL | 11 refills | Status: DC

## 2016-10-10 MED ORDER — "INSULIN SYRINGE-NEEDLE U-100 30G X 1/2"" 1 ML MISC": 1 refills | Status: DC

## 2016-10-10 MED ORDER — LISINOPRIL 40 MG PO TABS: 40.0000 mg | ORAL_TABLET | Freq: Every day | ORAL | 1 refills | Status: DC

## 2016-10-10 MED ORDER — OXYBUTYNIN CHLORIDE ER 5 MG PO TB24: 5.0000 mg | ORAL_TABLET | Freq: Two times a day (BID) | ORAL | 1 refills | Status: DC

## 2016-10-10 NOTE — Assessment & Plan Note (Signed)
Chronic issues; will work on getting A1c down

## 2016-10-10 NOTE — Assessment & Plan Note (Signed)
Start back on insulin; cautioned patient about starting back on that much insulin; be aware of symptoms, carry something to raise blood sugar at all times since on insulin Check A1c, foot exam; refer to diabetic eye exam

## 2016-10-10 NOTE — Assessment & Plan Note (Signed)
Check labs 

## 2016-10-10 NOTE — Patient Instructions (Addendum)
Please do talk with Dr. Rockey Situ about your lasix and potassium and we'll ask him to manage those Do ask him how long you should be on plavix I've sent refills to medication management clinic Try to limit saturated fats in your diet (bologna, hot dogs, barbeque, cheeseburgers, hamburgers, steak, bacon, sausage, cheese, etc.) and get more fresh fruits, vegetables, and whole grains I do encourage you to quit smoking Call [phone] to sign up for smoking cessation classes You can call 1-800-QUIT-NOW to talk with a smoking cessation coach   Steps to Quit Smoking Smoking tobacco can be bad for your health. It can also affect almost every organ in your body. Smoking puts you and people around you at risk for many serious long-lasting (chronic) diseases. Quitting smoking is hard, but it is one of the best things that you can do for your health. It is never too late to quit. What are the benefits of quitting smoking? When you quit smoking, you lower your risk for getting serious diseases and conditions. They can include:  Lung cancer or lung disease.  Heart disease.  Stroke.  Heart attack.  Not being able to have children (infertility).  Weak bones (osteoporosis) and broken bones (fractures).  If you have coughing, wheezing, and shortness of breath, those symptoms may get better when you quit. You may also get sick less often. If you are pregnant, quitting smoking can help to lower your chances of having a baby of low birth weight. What can I do to help me quit smoking? Talk with your doctor about what can help you quit smoking. Some things you can do (strategies) include:  Quitting smoking totally, instead of slowly cutting back how much you smoke over a period of time.  Going to in-person counseling. You are more likely to quit if you go to many counseling sessions.  Using resources and support systems, such as: ? Database administrator with a Social worker. ? Phone quitlines. ? Clinical research associate. ? Support groups or group counseling. ? Text messaging programs. ? Mobile phone apps or applications.  Taking medicines. Some of these medicines may have nicotine in them. If you are pregnant or breastfeeding, do not take any medicines to quit smoking unless your doctor says it is okay. Talk with your doctor about counseling or other things that can help you.  Talk with your doctor about using more than one strategy at the same time, such as taking medicines while you are also going to in-person counseling. This can help make quitting easier. What things can I do to make it easier to quit? Quitting smoking might feel very hard at first, but there is a lot that you can do to make it easier. Take these steps:  Talk to your family and friends. Ask them to support and encourage you.  Call phone quitlines, reach out to support groups, or work with a Social worker.  Ask people who smoke to not smoke around you.  Avoid places that make you want (trigger) to smoke, such as: ? Bars. ? Parties. ? Smoke-break areas at work.  Spend time with people who do not smoke.  Lower the stress in your life. Stress can make you want to smoke. Try these things to help your stress: ? Getting regular exercise. ? Deep-breathing exercises. ? Yoga. ? Meditating. ? Doing a body scan. To do this, close your eyes, focus on one area of your body at a time from head to toe, and notice which parts of your  body are tense. Try to relax the muscles in those areas.  Download or buy apps on your mobile phone or tablet that can help you stick to your quit plan. There are many free apps, such as QuitGuide from the [employer] Office manager for Disease Control and Prevention). You can find more support from smokefree.gov and other websites.  This information is not intended to replace advice given to you by your health care provider. Make sure you discuss any questions you have with your health care provider. Document Released:  02/11/2009 Document Revised: 12/14/2015 Document Reviewed: 09/01/2014 Elsevier Interactive Patient Education  2018 [employer].

## 2016-10-10 NOTE — Assessment & Plan Note (Signed)
noted 

## 2016-10-10 NOTE — Assessment & Plan Note (Signed)
Refer to podiastrist

## 2016-10-10 NOTE — Progress Notes (Signed)
BP (!) 145/85 (BP Location: Left Arm, Patient Position: Sitting, Cuff Size: Normal)   Pulse 94   Temp 98.2 F (36.8 C)   Ht 5' 8.5" (1.74 m)   Wt 249 lb 6.4 oz (113.1 kg)   BMI 37.37 kg/m    Subjective:    Patient ID: Melinda [name], female    DOB: 03-10-1956, 61 y.o.   MRN: [id number]  HPI: Melinda [name] is a 61 y.o. female  Chief Complaint  Patient presents with  . Medication Refill  . Diabetes  . Abdominal Pain  . Blurred Vision   HPI Patient is new to the clinic She has been out of her medicine for 2 months, all of her medicines A friend told her about this place; she lost her Medicaid Type 2 diabetes Was on novolog and lantus; she has not had any novolog for many months No episodes of low sugars No problems with that Needs insulin and syringes Last A1c was 8, but then rose back up to 12; needs to check that today Blurred vision since not having her insulin Her feet look terrible  She has seen Dr. Rockey Situ, cardiologist; appt with him tomorrow She had a heart attack; having chest tightness more than pain  She has hypertension, needs refills of her metoprolol,   Pain across the top of the stomach and left flank; a few weeks ago; thought maybe because she hasn't had insulin, has not had any problems with kidneys before  She has left shoulder pain since having the surgery; chronic issue  Issues with bladder; oxybutynin really helps  Asked about hydrocodone  Relevant past medical, surgical, family and social history reviewed Past Medical History:  Diagnosis Date  . Anxiety   . Coronary artery disease    a. NSTEMI 06/2014; b. 4 vessel CABG 06/08/2014 LIMA to LAD, SVG to Diagonal, SVG to OM, & SVG to PDA  . Diabetic neuropathy ([place])   . GERD (gastroesophageal reflux disease)   . Heart attack ([place])   . HTN (hypertension)   . IDDM (insulin dependent diabetes mellitus) ([place])   . Ischemic cardiomyopathy    a. echo 06/04/14 EF 40-45%, HK of entire inferolateral and  inferior myocardium c.w infarct of RCA/LCx, GR2DD, mild MR  . OA (osteoarthritis) of knee   . Obesity   . Osteoarthritis   . Osteomyelitis ([place])   . Peripheral neuropathy   . Tobacco abuse    Past Surgical History:  Procedure Laterality Date  . AMPUTATION TOE Left 10/21/2015   Procedure: AMPUTATION TOE;  Surgeon: Sharlotte Alamo, DPM;  Location: [facility];  Service: Podiatry;  Laterality: Left;  . AORTIC VALVE REPLACEMENT (AVR)/CORONARY ARTERY BYPASS GRAFTING (CABG)    . BREAST BIOPSY    . CARDIAC CATHETERIZATION  06/2014   95% stenosis mLAD, occlusion ostial OM1, 70% stenosis LCx, 95% stenosis mRCA, EF 45%.  . CORONARY ARTERY BYPASS GRAFT N/A 06/08/2014   Procedure: CORONARY ARTERY BYPASS GRAFTING (CABG);  Surgeon: Grace Isaac, MD;  Location: [place];  Service: Open Heart Surgery;  Laterality: N/A;  Times 4 using left internal mammary artery to LAD artery and endoscopically harvested bilateral saphenous vein to Obtuse Marginal, Diagonal and Posterior Descending coronary arteries.  . CT ABD W & PELVIS WO CM  06/2014   nl liver, gallbladder, spleen, mild diverticular changes, no bowel wall inflammation, appendix nl, no hernia, no other sig abnormalities  . TEE WITHOUT CARDIOVERSION N/A 06/08/2014   Procedure: TRANSESOPHAGEAL ECHOCARDIOGRAM (TEE);  Surgeon: Percell Miller  Maryruth Bun, MD;  Location: [place];  Service: Open Heart Surgery;  Laterality: N/A;  . TOE AMPUTATION Right 12/2011   rt middle toe  . US ECHOCARDIOGRAPHY  06/2014   EF 50-55%, HK of inf/post/inferolat walls, Ao sclerosis   Family History  Problem Relation Age of Onset  . CAD Mother   . Hypertension Mother   . Diabetes Mother   . CAD Father   . Diabetes Father   . Sickle cell trait Other   . Asthma Other    Social History   Social History  . Marital status: Widowed    Spouse name: N/A  . Number of children: N/A  . Years of education: N/A   Occupational History  . Not on file.   Social History Main Topics  . Smoking  status: Current Every Day Smoker    Packs/day: 0.50    Years: 40.00    Types: Cigarettes  . Smokeless tobacco: Never Used  . Alcohol use No  . Drug use: No  . Sexual activity: Not on file   Other Topics Concern  . Not on file   Social History Narrative  . No narrative on file    Interim medical history since last visit reviewed. Allergies and medications reviewed  Review of Systems Per HPI unless specifically indicated above     Objective:    BP (!) 145/85 (BP Location: Left Arm, Patient Position: Sitting, Cuff Size: Normal)   Pulse 94   Temp 98.2 F (36.8 C)   Ht 5' 8.5" (1.74 m)   Wt 249 lb 6.4 oz (113.1 kg)   BMI 37.37 kg/m   Wt Readings from Last 3 Encounters:  10/10/16 249 lb 6.4 oz (113.1 kg)  03/27/16 264 lb 8 oz (120 kg)  10/21/15 250 lb (113.4 kg)    Physical Exam  Constitutional: She appears well-developed and well-nourished. No distress.  HENT:  Head: Normocephalic and atraumatic.  Eyes: EOM are normal. No scleral icterus.  Neck: No thyromegaly present.  Cardiovascular: Normal rate, regular rhythm and normal heart sounds.   No murmur heard. Pulmonary/Chest: Effort normal and breath sounds normal. No respiratory distress. She has no wheezes.  Abdominal: Soft. Bowel sounds are normal. She exhibits no distension.  Musculoskeletal: Normal range of motion. She exhibits no edema.  Neurological: She is alert. She exhibits normal muscle tone.  Skin: Skin is warm and dry. She is not diaphoretic. No pallor.  Psychiatric: She has a normal mood and affect. Her behavior is normal. Judgment and thought content normal.   Diabetic Foot Form - Detailed   Diabetic Foot Exam - detailed Diabetic Foot exam was performed with the following findings:  Yes 10/10/2016  6:26 PM  Visual Foot Exam completed.:  Yes  Are the toenails long?:  Yes Are the toenails thick?:  Yes Normal Range of Motion:  Yes Pulse Foot Exam completed.:  Yes  Right Dorsalis Pedis:  Diminished Left  Dorsalis Pedis:  Diminished  Sensory Foot Exam Completed.:  Yes Swelling:  No Semmes-Weinstein Monofilament Test R Site 1-Great Toe:  Neg L Site 1-Great Toe:  Neg  R Site 4:  Neg L Site 4:  Neg  R Site 5:  Neg L Site 5:  Neg    Comments:  Refer to pdiatrist       Assessment & Plan:   Problem List Items Addressed This Visit      Cardiovascular and Mediastinum   RESOLVED: CAD (coronary artery disease)   Relevant Medications  atorvastatin (LIPITOR) 80 MG tablet   metoprolol tartrate (LOPRESSOR) 25 MG tablet   clopidogrel (PLAVIX) 75 MG tablet   lisinopril (PRINIVIL,ZESTRIL) 40 MG tablet     Endocrine   DM2 (diabetes mellitus, type 2) ([place])    Start back on insulin; cautioned patient about starting back on that much insulin; be aware of symptoms, carry something to raise blood sugar at all times since on insulin Check A1c, foot exam; refer to diabetic eye exam      Relevant Medications   insulin glargine (LANTUS) 100 UNIT/ML injection   atorvastatin (LIPITOR) 80 MG tablet   lisinopril (PRINIVIL,ZESTRIL) 40 MG tablet     Nervous and Auditory   Peripheral neuropathy    Chronic issues; will work on getting A1c down      Relevant Medications   varenicline (CHANTIX STARTING MONTH PAK) 0.5 MG X 11 & 1 MG X 42 tablet     Musculoskeletal and Integument   Onychomycosis    Refer to podiastrist      Relevant Orders   Ambulatory referral to Podiatry     Other   Medication monitoring encounter    Check labs      Relevant Orders   CBC with Differential/Platelet (Completed)   Comprehensive metabolic panel (Completed)   RESOLVED: Amputation, toe, traumatic (HCC)   Amputated toe, right ([place])    noted      Relevant Orders   Ambulatory referral to Podiatry   Amputated toe, left ([place])   Relevant Orders   Ambulatory referral to Podiatry    Other Visit Diagnoses    Diabetes mellitus without complication ([place])    -  Primary   Relevant Medications   insulin glargine  (LANTUS) 100 UNIT/ML injection   atorvastatin (LIPITOR) 80 MG tablet   lisinopril (PRINIVIL,ZESTRIL) 40 MG tablet   Other Relevant Orders   POCT glucose (manual entry) (Completed)   Hemoglobin A1c (Completed)   Lipid panel (Completed)   Microalbumin / creatinine urine ratio (Completed)   Ambulatory referral to Podiatry   Acute left flank pain       Relevant Orders   Urinalysis, Routine w reflex microscopic (Completed)       Follow up plan: Return in about 2 weeks (around 10/24/2016) for follow-up visit with provider, review labs and go over multiple issues.  An after-visit summary was printed and given to the patient at [place].  Please see the patient instructions which may contain other information and recommendations beyond what is mentioned above in the assessment and plan.  Meds ordered this encounter  Medications  . DISCONTD: oxybutynin (DITROPAN-XL) 5 MG 24 hr tablet    Sig: Take 5 mg by mouth 2 (two) times daily.  Marland Kitchen DISCONTD: DULoxetine (CYMBALTA) 30 MG capsule    Sig: Take 30 mg by mouth 2 (two) times daily.  . insulin glargine (LANTUS) 100 UNIT/ML injection    Sig: Inject 0.6 mLs (60 Units total) into the skin 2 (two) times daily.    Dispense:  4 vial    Refill:  1  . Insulin Syringe-Needle U-100 (B-D INS SYR ULTRAFINE 1CC/30G) 30G X 1/2" 1 ML MISC    Sig: Inject sixty units of long-acting insulin twice a day    Dispense:  100 each    Refill:  1  . atorvastatin (LIPITOR) 80 MG tablet    Sig: Take 1 tablet (80 mg total) by mouth daily at 6 PM.    Dispense:  30 tablet    Refill:  1  . metoprolol tartrate (LOPRESSOR) 25 MG tablet    Sig: Take 1 tablet (25 mg total) by mouth 2 (two) times daily.    Dispense:  60 tablet    Refill:  1  . clopidogrel (PLAVIX) 75 MG tablet    Sig: Take 1 tablet (75 mg total) by mouth daily.    Dispense:  30 tablet    Refill:  11  . lisinopril (PRINIVIL,ZESTRIL) 40 MG tablet    Sig: Take 1 tablet (40 mg total) by mouth daily.     Dispense:  30 tablet    Refill:  1  . varenicline (CHANTIX STARTING MONTH PAK) 0.5 MG X 11 & 1 MG X 42 tablet    Sig: Take one 0.5 mg tablet by mouth once daily for 3 days, then increase to one 0.5 mg tablet twice daily for 4 days, then increase to one 1 mg tablet twice daily.    Dispense:  53 tablet    Refill:  0  . oxybutynin (DITROPAN-XL) 5 MG 24 hr tablet    Sig: Take 1 tablet (5 mg total) by mouth 2 (two) times daily.    Dispense:  60 tablet    Refill:  1    Orders Placed This Encounter  Procedures  . Microscopic Examination  . Hemoglobin A1c  . Lipid panel  . Microalbumin / creatinine urine ratio  . CBC with Differential/Platelet  . Comprehensive metabolic panel  . Urinalysis, Routine w reflex microscopic  . Ambulatory referral to Podiatry  . POCT glucose (manual entry)

## 2016-10-11 ENCOUNTER — Ambulatory Visit: Payer: Medicaid Other

## 2016-10-11 DIAGNOSIS — I739 Peripheral vascular disease, unspecified: Secondary | ICD-10-CM

## 2016-10-11 LAB — URINALYSIS, ROUTINE W REFLEX MICROSCOPIC
Bilirubin, UA: NEGATIVE
Ketones, UA: NEGATIVE
Leukocytes, UA: NEGATIVE
Nitrite, UA: NEGATIVE
RBC, UA: NEGATIVE
Specific Gravity, UA: 1.021 (ref 1.005–1.030)
Urobilinogen, Ur: 0.2 mg/dL (ref 0.2–1.0)
pH, UA: 5 (ref 5.0–7.5)

## 2016-10-11 LAB — CBC WITH DIFFERENTIAL/PLATELET
Basophils Absolute: 0 10*3/uL (ref 0.0–0.2)
Basos: 0 %
EOS (ABSOLUTE): 0.6 10*3/uL — ABNORMAL HIGH (ref 0.0–0.4)
Eos: 7 %
Hematocrit: 40.6 % (ref 34.0–46.6)
Hemoglobin: 13.1 g/dL (ref 11.1–15.9)
Immature Grans (Abs): 0 10*3/uL (ref 0.0–0.1)
Immature Granulocytes: 0 %
Lymphocytes Absolute: 3.1 10*3/uL (ref 0.7–3.1)
Lymphs: 34 %
MCH: 25.3 pg — ABNORMAL LOW (ref 26.6–33.0)
MCHC: 32.3 g/dL (ref 31.5–35.7)
MCV: 78 fL — ABNORMAL LOW (ref 79–97)
Monocytes Absolute: 0.6 10*3/uL (ref 0.1–0.9)
Monocytes: 6 %
Neutrophils Absolute: 4.7 10*3/uL (ref 1.4–7.0)
Neutrophils: 53 %
Platelets: 301 10*3/uL (ref 150–379)
RBC: 5.18 x10E6/uL (ref 3.77–5.28)
RDW: 15.9 % — ABNORMAL HIGH (ref 12.3–15.4)
WBC: 9 10*3/uL (ref 3.4–10.8)

## 2016-10-11 LAB — VAS US LOWER EXTREMITY ARTERIAL DUPLEX
LEFT POPLITEAL DIST DYS VEL: -10 cm/s
LEFT POPLITEAL PROX DYS VEL: 14 cm/s
LEFT SFA DIST DYS VEL: 0 cm/s
LEFT SFA MID VEL: 0 cm/s
LEFT SFA PROX DYS VEL: 0 cm/s
Left ant tibial mid sys: -43 cm/s
Left popliteal dist sys PSV: -49 cm/s
Left popliteal prox sys PSV: 84 cm/s
Left post tibial mid dia: 10 cm/s
Left super femoral dist sys PSV: -52 cm/s
Left super femoral mid sys PSV: -101 cm/s
Left super femoral prox sys PSV: 79 cm/s
RIGHT ANT MID TIBIAL SYS PSV: 88 cm/s
RIGHT POPLITEAL DIST EDV: 0 cm/s
RIGHT POPLITEAL PROX EDV: 0 cm/s
RIGHT POST TIB MID DIA: 0 cm/s
RIGHT POST TIB MID SYS: 61 cm/s
RIGHT SUPER FEMORAL DIST EDV: 0 cm/s
RIGHT SUPER FEMORAL MID EDV: 0 cm/s
RIGHT SUPER FEMORAL PROX EDV: 0 cm/s
Right popliteal dist sys PSV: -216 cm/s
Right popliteal prox sys PSV: -364 cm/s
Right super femoral dist sys PSV: -99 cm/s
Right super femoral mid sys PSV: -203 cm/s
Right super femoral prox sys PSV: 115 cm/s
left post tibial mid sys: 63 cm/s

## 2016-10-11 LAB — LIPID PANEL
Chol/HDL Ratio: 5.1 ratio — ABNORMAL HIGH (ref 0.0–4.4)
Cholesterol, Total: 214 mg/dL — ABNORMAL HIGH (ref 100–199)
HDL: 42 mg/dL (ref >39)
LDL Calculated: 103 mg/dL — ABNORMAL HIGH (ref 0–99)
Triglycerides: 346 mg/dL — ABNORMAL HIGH (ref 0–149)
VLDL Cholesterol Cal: 69 mg/dL — ABNORMAL HIGH (ref 5–40)

## 2016-10-11 LAB — COMPREHENSIVE METABOLIC PANEL
ALT: 13 IU/L (ref 0–32)
AST: 9 IU/L (ref 0–40)
Albumin/Globulin Ratio: 1.3 (ref 1.2–2.2)
Albumin: 3.9 g/dL (ref 3.6–4.8)
Alkaline Phosphatase: 185 IU/L — ABNORMAL HIGH (ref 39–117)
BUN/Creatinine Ratio: 17 (ref 12–28)
BUN: 16 mg/dL (ref 8–27)
Bilirubin Total: <0.2 mg/dL (ref 0.0–1.2)
CO2: 26 mmol/L (ref 20–29)
Calcium: 9.5 mg/dL (ref 8.7–10.3)
Chloride: 98 mmol/L (ref 96–106)
Creatinine, Ser: 0.92 mg/dL (ref 0.57–1.00)
GFR calc Af Amer: 77 mL/min/{1.73_m2} (ref >59)
GFR calc non Af Amer: 67 mL/min/{1.73_m2} (ref >59)
Globulin, Total: 2.9 g/dL (ref 1.5–4.5)
Glucose: 314 mg/dL — ABNORMAL HIGH (ref 65–99)
Potassium: 4.4 mmol/L (ref 3.5–5.2)
Sodium: 139 mmol/L (ref 134–144)
Total Protein: 6.8 g/dL (ref 6.0–8.5)

## 2016-10-11 LAB — MICROSCOPIC EXAMINATION: Casts: NONE SEEN /lpf

## 2016-10-11 LAB — MICROALBUMIN / CREATININE URINE RATIO
Creatinine, Urine: 58.3 mg/dL
Microalb/Creat Ratio: 500.9 mg/g creat — ABNORMAL HIGH (ref 0.0–30.0)
Microalbumin, Urine: 292 ug/mL

## 2016-10-11 LAB — HEMOGLOBIN A1C
Est. average glucose Bld gHb Est-mCnc: 295 mg/dL
Hgb A1c MFr Bld: 11.9 % — ABNORMAL HIGH (ref 4.8–5.6)

## 2016-10-18 ENCOUNTER — Ambulatory Visit (INDEPENDENT_AMBULATORY_CARE_PROVIDER_SITE_OTHER): Payer: Self-pay | Admitting: Internal Medicine

## 2016-10-18 ENCOUNTER — Encounter: Payer: Self-pay | Admitting: Internal Medicine

## 2016-10-18 VITALS — BP 124/66 | Ht 68.0 in | Wt 262.2 lb

## 2016-10-18 DIAGNOSIS — E785 Hyperlipidemia, unspecified: Secondary | ICD-10-CM

## 2016-10-18 DIAGNOSIS — I2510 Atherosclerotic heart disease of native coronary artery without angina pectoris: Secondary | ICD-10-CM

## 2016-10-18 DIAGNOSIS — Z01818 Encounter for other preprocedural examination: Secondary | ICD-10-CM

## 2016-10-18 DIAGNOSIS — I10 Essential (primary) hypertension: Secondary | ICD-10-CM

## 2016-10-18 DIAGNOSIS — I739 Peripheral vascular disease, unspecified: Secondary | ICD-10-CM | POA: Insufficient documentation

## 2016-10-18 NOTE — Patient Instructions (Signed)
Medication Instructions:  Your physician recommends that you continue on your current medications as directed. Please refer to the Current Medication list given to you today.   Labwork: 1. Your physician recommends that you return for lab work in: [place]. (CBC, BMP, PT/INR) - Please go to the [facility]. You will check in at the front desk to the right as you walk into the atrium. Valet Parking is offered if needed.  2. A chest x-ray takes a picture of the organs and structures inside the chest, including the heart, lungs, and blood vessels. This test can show several things, including, whether the heart is enlarges; whether fluid is building up in the lungs; and whether pacemaker / defibrillator leads are still in place. - AT [place].    Testing/Procedures:  You are scheduled for a ABDOMINAL AORTAGRAM on 10/27/16 with Dr. END.  Please arrive at the [place] [facility]" of [facility] at 05:30 AM on the day of your procedure.  1. Nothing to eat or drink after midnight. 2. You should take your medication as usual with a sip of water; this includes your aspirin and Plavix. 3. If you are diabetic, do NOT take your LANTUS the morning of the procedure.  These will be given to you after the procedure. 4. DO NOT TAKE YOUR the following medications the morning of your procedure: 1. Lisinopril or  2. Furosemide 3. Lantus (You may take 1/2 your evening dose of Lantus the night before the procedure. 5. If you are taking any medication with Glucophage (Metformin) in it, do NOT take your dose the day before or the day of your procedure.  You will be instructed when to re-start your medication. 6. You will need bloodwork and a chest xray prior to the procedure.  You do not have to be fasting.  7. Plan for one night stay - bring personal belongings (i.e. Toothpaste, toothbrush, etc.). 8. Bring a current list of your  medications and current insurance cards. 9. Must have a responsible person to drive you home. 10. Someone must be with you for the first 24 hours after you arrive home. 11. Please wear clothes that are easy to get on and off and wear slip-on shoes.  * Special note:  Every effort is made to have your procedure done on time.  Occasionally there are emergencies that present themselves at the hospital that may cause delays.  Please be patient if a delay does occur.  If you have any questions after you get home, please call the office at [phone].    Follow-Up: Your physician recommends that you schedule a follow-up appointment AS SCHEDULED WITH DR Rockey Situ.   If you need a refill on your cardiac medications before your next appointment, please call your pharmacy.

## 2016-10-18 NOTE — Progress Notes (Signed)
Outpatient Peripheral Vascular Consultation Date: 10/18/2016  Referring Provider Esmond Plants, MD [facility] [place], [address] [place], [place] [address]  Chief Complaint: Claudication and abnormal ABIs  HPI:  Melinda [name] is a 61 y.o. year-old female with history of coronary artery disease status post 4 vessel CABG in 06/2014, hypertension, tobacco abuse, diabetes, obesity, and peripheral neuropathy, who has been referred by Dr. Rockey Situ for evaluation of claudication and abnormal vascular studies. She follows with Dr. Rockey Situ, and was last seen in 03/2016. Vascular studies were ordered at that time, though patient canceled or no showed for multiple appointments. She finally had vascular studies last week, which were significantly abnormal (see details below). 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 She notes that her pain has continued to progress and is located from her thighs distally to her feet. There is both a burning quality as well as an ache. The burning is there almost all the time, though the ache worsened significantly with even short walks (less than 50 feet). She does not have any open wounds at this time, but notes that it takes quite a while for small wounds on her shins to heal.  Melinda [name] has intermittent exertional dyspnea. She has not had any chest pain, palpitations, or lightheadedness. Earlier this year, she lost her insurance and was off of all of her medications for at least 2 months. [facility] and has restarted many of her medications. However, she remains off gabapentin, which which she previously took for peripheral neuropathy.  --------------------------------------------------------------------------------------------------  Cardiovascular History & Procedures: Cardiovascular Problems:  Coronary artery  disease status post NSTEMI and CABG (2016)  Peripheral vascular disease with claudication  Ischemic cardiomyopathy  Risk Factors:  Known CAD, PVD, hypertension, hyperlipidemia, diabetes mellitus, sedentary lifestyle, obesity, and tobacco use  Cath/PCI:  LHC (06/2014): 3 vessel disease; further details not available  CV Surgery:  CABG (06/09/14, Dr. Jobie Quaker): LIMA to LAD, SVG to diagonal, SVG to OM, and SVG to PDA.  EP Procedures and Devices:  None  Non-Invasive Evaluation(s):  Bilateral lower extremity arterial Doppler (10/11/16): Technically challenging aorto-iliac portion. >50% right common iliac artery stenosis. Diffuse heterogeneous plaque, bilaterally. 50-74% right mid popliteal artery stenosis. Occluded left proximal SFA. Two-vessel run-off seen, bilaterally.  Bilateral ABI (10/11/16): Right colon 0.50, left 0.60.  TTE (06/04/14): Normal LV size with moderate LVH. LVEF 40-45% with severe hypokinesis of the inferolateral and inferior myocardium. Grade 2 diastolic dysfunction. Mild MR. Normal RV size and function.  Recent CV Pertinent Labs: Lab Results  Component Value Date   CHOL 214 (H) 10/10/2016   CHOL 94 06/02/2014   HDL 42 10/10/2016   HDL 39 (L) 06/02/2014   LDLCALC 103 (H) 10/10/2016   LDLCALC 31 06/02/2014   TRIG 346 (H) 10/10/2016   TRIG 122 06/02/2014   CHOLHDL 5.1 (H) 10/10/2016   INR 1.28 06/08/2014   INR 1.1 06/03/2014   K 4.4 10/10/2016   K 4.1 06/03/2014   MG 2.3 06/09/2014   BUN 16 10/10/2016   BUN 13 06/03/2014   CREATININE 0.92 10/10/2016   CREATININE 1.13 06/03/2014    Past medical and surgical history were reviewed and updated in EPIC.  Outpatient Encounter Prescriptions as of 10/18/2016  Medication Sig  . aspirin EC 81 MG EC tablet Take 1 tablet (81 mg total) by mouth daily.  Marland Kitchen  atorvastatin (LIPITOR) 80 MG tablet Take 1 tablet (80 mg total) by mouth daily at 6 PM.  . clopidogrel (PLAVIX) 75 MG tablet Take 1 tablet (75 mg total) by mouth  daily.  . furosemide (LASIX) 20 MG tablet Take 1 tablet (20 mg total) by mouth daily as needed.  . insulin glargine (LANTUS) 100 UNIT/ML injection Inject 0.6 mLs (60 Units total) into the skin 2 (two) times daily.  . Insulin Syringe-Needle U-100 (B-D INS SYR ULTRAFINE 1CC/30G) 30G X 1/2" 1 ML MISC Inject sixty units of long-acting insulin twice a day  . lisinopril (PRINIVIL,ZESTRIL) 40 MG tablet Take 1 tablet (40 mg total) by mouth daily.  . metoprolol tartrate (LOPRESSOR) 25 MG tablet Take 1 tablet (25 mg total) by mouth 2 (two) times daily.  Marland Kitchen oxybutynin (DITROPAN-XL) 5 MG 24 hr tablet Take 1 tablet (5 mg total) by mouth 2 (two) times daily.  . potassium chloride SA (K-DUR,KLOR-CON) 20 MEQ tablet Take 1 tablet (20 mEq total) by mouth daily as needed.  . varenicline (CHANTIX STARTING MONTH PAK) 0.5 MG X 11 & 1 MG X 42 tablet Take one 0.5 mg tablet by mouth once daily for 3 days, then increase to one 0.5 mg tablet twice daily for 4 days, then increase to one 1 mg tablet twice daily.  . [DISCONTINUED] albuterol (PROVENTIL) (2.5 MG/3ML) 0.083% nebulizer solution Take 3 mLs (2.5 mg total) by nebulization every 2 (two) hours as needed for wheezing. (Patient not taking: Reported on 10/18/2016)   No facility-administered encounter medications on file as of 10/18/2016.     Allergies: Patient has no known allergies.  Social History   Social History  . Marital status: Widowed    Spouse name: N/A  . Number of children: N/A  . Years of education: N/A   Occupational History  . Not on file.   Social History Main Topics  . Smoking status: Current Every Day Smoker    Packs/day: 0.50    Years: 40.00    Types: Cigarettes  . Smokeless tobacco: Never Used  . Alcohol use No  . Drug use: No  . Sexual activity: Not on file   Other Topics Concern  . Not on file   Social History Narrative  . No narrative on file    Family History  Problem Relation Age of Onset  . CAD Mother   . Hypertension  Mother   . Diabetes Mother   . CAD Father   . Diabetes Father   . Sickle cell trait Other   . Asthma Other     Review of Systems: A 12-system review of systems was performed and was negative except as noted in the HPI.  --------------------------------------------------------------------------------------------------  Physical Exam: BP 124/66 (BP Location: Left Arm, Patient Position: Sitting, Cuff Size: Normal)   Ht 5\' 8"  (1.727 m)   Wt 262 lb 4 oz (119 kg)   BMI 39.87 kg/m   General:  Obese woman, seated comfortably on the exam table. HEENT: No conjunctival pallor or scleral icterus.  Moist mucous membranes.  OP clear. Neck: Supple without lymphadenopathy, thyromegaly, JVD, or HJR. Lungs: Normal work of breathing.  Clear to auscultation bilaterally without wheezes or crackles. Heart: Regular rate and rhythm without murmurs, rubs, or gallops.  Non-displaced PMI. Abd: Bowel sounds present.  Soft, NT/ND without hepatosplenomegaly Ext: No lower extremity edema.  Third toes are surgically absent bilaterally. Radial pulses are 2+ bilaterally. Right femoral pulses 1+. Left femoral pulses trace. Pedal pulses are nonpalpable bilaterally.  Skin: warm and dry without rash  Lab Results  Component Value Date   WBC 9.0 10/10/2016   HGB 13.1 10/10/2016   HCT 40.6 10/10/2016   MCV 78 (L) 10/10/2016   PLT 301 10/10/2016    Lab Results  Component Value Date   NA 139 10/10/2016   K 4.4 10/10/2016   CL 98 10/10/2016   CO2 26 10/10/2016   BUN 16 10/10/2016   CREATININE 0.92 10/10/2016   GLUCOSE 314 (H) 10/10/2016   ALT 13 10/10/2016    Lab Results  Component Value Date   CHOL 214 (H) 10/10/2016   HDL 42 10/10/2016   LDLCALC 103 (H) 10/10/2016   TRIG 346 (H) 10/10/2016   CHOLHDL 5.1 (H) 10/10/2016    --------------------------------------------------------------------------------------------------  ASSESSMENT AND PLAN: Peripheral arterial disease with claudication Melinda [name]  has long-standing bilateral lower extremity pain, which is likely a combination of neuropathy and claudication. She has claudication with minimal activity, consistent with Rutherford stage III. 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 Recent vascular studies suggest evidence of bilateral inflow and outflow disease. We have discussed further evaluation and treatment options; I have recommended abdominal aortography with runoff and possible intervention. The risks and benefits of the study were discussed with the patient, who would like to proceed. We will obtain CBC, BMP, and INR today. I will not make any medication changes at this time; she should continue dual antiplatelet therapy with aspirin and clopidogrel. I advised the patient to stop smoking.  Coronary artery disease No recurrent chest pain, though she reports some exertional dyspnea. She will follow-up with Dr. Cammie Sickle this week. I will defer management to him.  Hypertension Blood pressure is well controlled. No medication changes today.  Hyperlipidemia Labs earlier this month are notable for an LDL of 103, which is above goal (goal less than 70). She is already on atorvastatin 80 mg daily, though she has had issues with medication compliance recently due to insurance issues. I will defer further management to Drs. Gollan and Aycock.  Follow-up: To be determined based on angiography.  Nelva Bush, MD 10/18/2016 9:42 PM

## 2016-10-20 ENCOUNTER — Ambulatory Visit (INDEPENDENT_AMBULATORY_CARE_PROVIDER_SITE_OTHER): Payer: Self-pay | Admitting: Cardiovascular Disease

## 2016-10-20 ENCOUNTER — Encounter: Payer: Self-pay | Admitting: Cardiovascular Disease

## 2016-10-20 ENCOUNTER — Other Ambulatory Visit
Admission: RE | Admit: 2016-10-20 | Discharge: 2016-10-20 | Disposition: A | Discharge status: Home / Self Care | Payer: Self-pay | Source: Ambulatory Visit | Attending: Internal Medicine | Admitting: Internal Medicine

## 2016-10-20 ENCOUNTER — Ambulatory Visit
Admission: RE | Admit: 2016-10-20 | Discharge: 2016-10-20 | Disposition: A | Discharge status: Home / Self Care | Payer: Self-pay | Source: Ambulatory Visit | Attending: Internal Medicine | Admitting: Internal Medicine

## 2016-10-20 VITALS — BP 144/78 | HR 73 | Ht 68.5 in | Wt 262.5 lb

## 2016-10-20 DIAGNOSIS — Z01818 Encounter for other preprocedural examination: Secondary | ICD-10-CM

## 2016-10-20 DIAGNOSIS — I255 Ischemic cardiomyopathy: Secondary | ICD-10-CM

## 2016-10-20 DIAGNOSIS — I2510 Atherosclerotic heart disease of native coronary artery without angina pectoris: Secondary | ICD-10-CM

## 2016-10-20 DIAGNOSIS — Z951 Presence of aortocoronary bypass graft: Secondary | ICD-10-CM | POA: Insufficient documentation

## 2016-10-20 DIAGNOSIS — E1159 Type 2 diabetes mellitus with other circulatory complications: Secondary | ICD-10-CM

## 2016-10-20 DIAGNOSIS — I739 Peripheral vascular disease, unspecified: Secondary | ICD-10-CM

## 2016-10-20 DIAGNOSIS — Z794 Long term (current) use of insulin: Secondary | ICD-10-CM

## 2016-10-20 LAB — CBC WITH DIFFERENTIAL/PLATELET
Basophils Absolute: 0.1 10*3/uL (ref 0–0.1)
Basophils Relative: 1 %
Eosinophils Absolute: 0.5 10*3/uL (ref 0–0.7)
Eosinophils Relative: 6 %
HCT: 39.1 % (ref 35.0–47.0)
Hemoglobin: 12.7 g/dL (ref 12.0–16.0)
Lymphocytes Relative: 24 %
Lymphs Abs: 1.8 10*3/uL (ref 1.0–3.6)
MCH: 25 pg — ABNORMAL LOW (ref 26.0–34.0)
MCHC: 32.5 g/dL (ref 32.0–36.0)
MCV: 77.1 fL — ABNORMAL LOW (ref 80.0–100.0)
Monocytes Absolute: 0.7 10*3/uL (ref 0.2–0.9)
Monocytes Relative: 9 %
Neutro Abs: 4.5 10*3/uL (ref 1.4–6.5)
Neutrophils Relative %: 60 %
Platelets: 273 10*3/uL (ref 150–440)
RBC: 5.08 MIL/uL (ref 3.80–5.20)
RDW: 15.7 % — ABNORMAL HIGH (ref 11.5–14.5)
WBC: 7.5 10*3/uL (ref 3.6–11.0)

## 2016-10-20 LAB — PROTIME-INR
INR: 1.05
Prothrombin Time: 13.7 seconds (ref 11.4–15.2)

## 2016-10-20 LAB — BASIC METABOLIC PANEL
Anion gap: 8 (ref 5–15)
BUN: 24 mg/dL — ABNORMAL HIGH (ref 6–20)
CO2: 28 mmol/L (ref 22–32)
Calcium: 8.8 mg/dL — ABNORMAL LOW (ref 8.9–10.3)
Chloride: 107 mmol/L (ref 101–111)
Creatinine, Ser: 1.41 mg/dL — ABNORMAL HIGH (ref 0.44–1.00)
GFR calc Af Amer: 45 mL/min — ABNORMAL LOW (ref >60)
GFR calc non Af Amer: 39 mL/min — ABNORMAL LOW (ref >60)
Glucose, Bld: 213 mg/dL — ABNORMAL HIGH (ref 65–99)
Potassium: 3.9 mmol/L (ref 3.5–5.1)
Sodium: 143 mmol/L (ref 135–145)

## 2016-10-20 MED ORDER — FUROSEMIDE 20 MG PO TABS: 20.0000 mg | ORAL_TABLET | Freq: Every day | ORAL | 11 refills | Status: DC | PRN

## 2016-10-20 MED ORDER — POTASSIUM CHLORIDE CRYS ER 20 MEQ PO TBCR: 20.0000 meq | EXTENDED_RELEASE_TABLET | Freq: Every day | ORAL | 11 refills | Status: DC | PRN

## 2016-10-20 MED ORDER — LISINOPRIL 40 MG PO TABS: 40.0000 mg | ORAL_TABLET | Freq: Every day | ORAL | 11 refills | Status: DC

## 2016-10-20 MED ORDER — CLOPIDOGREL BISULFATE 75 MG PO TABS: 75.0000 mg | ORAL_TABLET | Freq: Every day | ORAL | 11 refills | Status: DC

## 2016-10-20 MED ORDER — ATORVASTATIN CALCIUM 80 MG PO TABS: 80.0000 mg | ORAL_TABLET | Freq: Every day | ORAL | 11 refills | Status: DC

## 2016-10-20 MED ORDER — METOPROLOL TARTRATE 25 MG PO TABS: 25.0000 mg | ORAL_TABLET | Freq: Two times a day (BID) | ORAL | 11 refills | Status: DC

## 2016-10-20 NOTE — Patient Instructions (Signed)

## 2016-10-20 NOTE — Progress Notes (Signed)
Cardiology Office Note  Date:  10/20/2016   ID:  Melinda [name], DOB 1956-01-17, MRN [id number]  PCP:  Donnie Coffin, MD   Chief Complaint  Patient presents with  . other    6 month f/u c/o weight gain pt has questions about blood thinner&Aspirin requesting Rx for gabapentin. Meds reviewed verbally with pt.    HPI:  61 y.o. female with h/o CAD s/p 4 vessel CABG on 06/08/2014  poorly controlled IDDM,  HTN,  ongoing tobacco abuse,  obesity,  anxiety,  peripheral neuropathy  admitted to [facility] from 2/1-06/03/14 for NSTEMI.  After undergoing cardiac cath on 2/3 that showed severe 3 vessel disease she was transferred to [facility] from 2/3-2/13 for . 4 vessel CABG (LIMA to LAD, SVG to Diagonal, SVG to OM, and SVG to PDA) She presents today for follow-up in the clinic for her coronary artery disease  In follow-up today she reports that she was out of her medications for 2 months Lost her Medicaid Scheduled to go on Medicare in one week or so Difficulty affording the insulin Having chronic left low back pain, worse on movement  Scheduled to have lower extremity arterial study with Dr. Saunders Revel in [facility] June 29 She's having difficulty obtaining a ride early in the morning for pre-hydration   hemoglobin A1c typically greater than 12 Long discussion concerning smoking cessation Did well on  Chantix, unable to afford it anymore , smoking again  Denies any significant chest pain  EKG on today's visit shows normal sinus rhythm with rate 73 bpm, ST and T wave abnormality  unchanged from previous EKG   other past medical history   presented to [facility] ED on 2/1 with headache, neck pain, nasal pain, chest tightness, nausea, and just overall did not feel well. Work up in the [facility] ED confirmed sinus infection, which she started on Augmentin for. She was also found to be hypotensive with BP 85/51. She received IV fluids with improvement in her BP to 120s/80s. Negative abdominal CT. She did have an elevated  troponin at <0.02->1.30-->3.60-->7.80-->13.26-->23.00-->30.00. She was placed on a heparin gtt. Cardiac cath showed 95% stenosis mLAD, occlusion ostial OM1, 70% stenosis LCx, 95% stenosis mRCA, EF 45%. She underwent successful cabg on 06/08/2014 at [facility]. She tolerated this procedure well. Echo at [facility] showed EF 40-45%, HK of entire inferior and inferolateral wall c/w infarction of RCA or LCx, GR2DD, and mild TR. She was discharged on Lopressor 12.5 mg bid with labile BP. She was not started on an ACEi 2/2 labile BP. Hgb was stable at discharge. She was continued on Lasix 40 mg daily. She was advised to follow up with her PCP for her blood sugars (pre op A1C 10.5%).    PMH:   has a past medical history of Anxiety; Coronary artery disease; Diabetic neuropathy ([place]); GERD (gastroesophageal reflux disease); Heart attack ([place]); HTN (hypertension); IDDM (insulin dependent diabetes mellitus) ([place]); Ischemic cardiomyopathy; OA (osteoarthritis) of knee; Obesity; Osteoarthritis; Osteomyelitis ([place]); Peripheral neuropathy; Peripheral vascular disease ([place]); and Tobacco abuse.  PSH:    Past Surgical History:  Procedure Laterality Date  . AMPUTATION TOE Left 10/21/2015   Procedure: AMPUTATION TOE;  Surgeon: Sharlotte Alamo, DPM;  Location: [facility] ORS;  Service: Podiatry;  Laterality: Left;  . AORTIC VALVE REPLACEMENT (AVR)/CORONARY ARTERY BYPASS GRAFTING (CABG)    . BREAST BIOPSY    . CARDIAC CATHETERIZATION  06/2014   95% stenosis mLAD, occlusion ostial OM1, 70% stenosis LCx, 95% stenosis mRCA, EF 45%.  . CORONARY  ARTERY BYPASS GRAFT N/A 06/08/2014   Procedure: CORONARY ARTERY BYPASS GRAFTING (CABG);  Surgeon: Grace Isaac, MD;  Location: [place];  Service: Open Heart Surgery;  Laterality: N/A;  Times 4 using left internal mammary artery to LAD artery and endoscopically harvested bilateral saphenous vein to Obtuse Marginal, Diagonal and Posterior Descending coronary arteries.  . CT ABD W & PELVIS WO CM  06/2014   nl  liver, gallbladder, spleen, mild diverticular changes, no bowel wall inflammation, appendix nl, no hernia, no other sig abnormalities  . TEE WITHOUT CARDIOVERSION N/A 06/08/2014   Procedure: TRANSESOPHAGEAL ECHOCARDIOGRAM (TEE);  Surgeon: Grace Isaac, MD;  Location: [facility];  Service: Open Heart Surgery;  Laterality: N/A;  . TOE AMPUTATION Right 12/2011   rt middle toe  . US ECHOCARDIOGRAPHY  06/2014   EF 50-55%, HK of inf/post/inferolat walls, Ao sclerosis    Current Outpatient Prescriptions  Medication Sig Dispense Refill  . atorvastatin (LIPITOR) 80 MG tablet Take 1 tablet (80 mg total) by mouth daily at 6 PM. 30 tablet 11  . clopidogrel (PLAVIX) 75 MG tablet Take 1 tablet (75 mg total) by mouth daily. 30 tablet 11  . DULoxetine HCl (CYMBALTA PO) Take by mouth 2 (two) times daily.    . furosemide (LASIX) 20 MG tablet Take 1 tablet (20 mg total) by mouth daily as needed. 30 tablet 11  . gabapentin (NEURONTIN) 300 MG capsule Take 900 mg by mouth 3 (three) times daily.    . insulin glargine (LANTUS) 100 UNIT/ML injection Inject 0.6 mLs (60 Units total) into the skin 2 (two) times daily. 4 vial 1  . Insulin Syringe-Needle U-100 (B-D INS SYR ULTRAFINE 1CC/30G) 30G X 1/2" 1 ML MISC Inject sixty units of long-acting insulin twice a day 100 each 1  . lisinopril (PRINIVIL,ZESTRIL) 40 MG tablet Take 1 tablet (40 mg total) by mouth daily. 30 tablet 11  . metoprolol tartrate (LOPRESSOR) 25 MG tablet Take 1 tablet (25 mg total) by mouth 2 (two) times daily. 60 tablet 11  . oxybutynin (DITROPAN-XL) 5 MG 24 hr tablet Take 1 tablet (5 mg total) by mouth 2 (two) times daily. 60 tablet 1  . potassium chloride SA (K-DUR,KLOR-CON) 20 MEQ tablet Take 1 tablet (20 mEq total) by mouth daily as needed. 30 tablet 11  . aspirin EC 81 MG EC tablet Take 1 tablet (81 mg total) by mouth daily. (Patient not taking: Reported on 10/20/2016) 30 tablet 0   No current facility-administered medications for this visit.       Allergies:   Patient has no known allergies.   Social History:  The patient  reports that she has been smoking Cigarettes.  She has a 20.00 pack-year smoking history. She has never used smokeless tobacco. She reports that she does not drink alcohol or use drugs.   Family History:   family history includes Asthma in her other; CAD in her father and mother; Diabetes in her father and mother; Hypertension in her mother; Sickle cell trait in her other.    Review of Systems: Review of Systems  Constitutional: Negative.   Respiratory: Negative.   Cardiovascular: Negative.   Gastrointestinal: Negative.   Musculoskeletal: Positive for back pain.       Claudication pain in her legs  Neurological: Negative.   Psychiatric/Behavioral: Negative.   All other systems reviewed and are negative.    PHYSICAL EXAM: VS:  BP (!) 144/78 (BP Location: Left Arm, Patient Position: Sitting, Cuff Size: Large)   Pulse  73   Ht 5' 8.5" (1.74 m)   Wt 262 lb 8 oz (119.1 kg)   BMI 39.33 kg/m  , BMI Body mass index is 39.33 kg/m. GEN: Well nourished, well developed, in no acute distress  HEENT: normal  Neck: no JVD, carotid bruits, or masses Cardiac: RRR; no murmurs, rubs, or gallops,no edema  Respiratory:  clear to auscultation bilaterally, normal work of breathing GI: soft, nontender, nondistended, + BS MS: no deformity or atrophy  Skin: warm and dry, no rash Neuro:  Strength and sensation are intact Psych: euthymic mood, full affect    Recent Labs: 10/10/2016: ALT 13 10/20/2016: BUN 24; Creatinine, Ser 1.41; Hemoglobin 12.7; Platelets 273; Potassium 3.9; Sodium 143    Lipid Panel Lab Results  Component Value Date   CHOL 214 (H) 10/10/2016   HDL 42 10/10/2016   LDLCALC 103 (H) 10/10/2016   TRIG 346 (H) 10/10/2016      Wt Readings from Last 3 Encounters:  10/20/16 262 lb 8 oz (119.1 kg)  10/18/16 262 lb 4 oz (119 kg)  10/10/16 249 lb 6.4 oz (113.1 kg)       ASSESSMENT AND  PLAN:  Coronary artery disease involving native coronary artery of native heart without angina pectoris -  Currently with no symptoms of angina. No further workup at this time. Continue current medication regimen. Recommended she stay on aspirin and Plavix  Essential hypertension -  Blood pressure is well controlled on today's visit. No changes made to the medications. She is only recently back on her medications  Ischemic cardiomyopathy -  Medications restarted as listed  Claudication [facility]) -  scheduled for angiogram with Dr. Saunders Revel next week  S/P CABG x 4 Currently with no symptoms of angina. No further workup at this time. Continue current medication regimen.  Type 2 diabetes mellitus with other circulatory complication, with long-term current use of insulin ([place]) We have encouraged continued exercise, careful diet management in an effort to lose weight.  significant dietary indiscretion  Tobacco abuse We have encouraged her to continue to work on weaning her cigarettes and smoking cessation. She will continue to work on this and does not want any assistance with chantix.    Total encounter time more than 25 minutes  Greater than 50% was spent in counseling and coordination of care with the patient    Disposition:   F/U  6 months   Orders Placed This Encounter  Procedures  . EKG 12-Lead     Signed, Esmond Plants, M.D., Ph.D. 10/20/2016  [place], [place]

## 2016-10-23 ENCOUNTER — Ambulatory Visit: Payer: Self-pay | Admitting: Pharmacy Technician

## 2016-10-23 NOTE — Progress Notes (Signed)
Patient scheduled for [facility] at [facility].  Patient did not show for the appointment on June 25,  2018at 2:00p.m.  Patient did not reschedule eligibility appointment.  [facility] unable to provide additional medication assistance since patient is eligible to sign-up for a Medicare Part D plan.  Medicare Parts A & B will begin 10/29/16.  Patient made aware that she needs to sign-up for a Medicare Part D plan.  Also, informed patient about how to be screened for a Medicare Savings Account and L.I.S. (Low Income Subsidy).   [facility]

## 2016-10-24 ENCOUNTER — Ambulatory Visit: Payer: Medicaid Other

## 2016-10-26 ENCOUNTER — Telehealth: Payer: Self-pay | Admitting: *Deleted

## 2016-10-26 NOTE — Telephone Encounter (Signed)
Patient scheduled for Abdominal Aortagram on 10/27/16 at 7:30am, arriva at 05:30 am at [facility].  Attempted to reach patient to remind and review per-procedural instructions. No answer and no voicemail to leave a message.

## 2016-10-27 ENCOUNTER — Ambulatory Visit (HOSPITAL_COMMUNITY)
Admission: RE | Admit: 2016-10-27 | Discharge: 2016-10-27 | Disposition: A | Discharge status: Home / Self Care | Payer: Self-pay | Source: Ambulatory Visit | Attending: Internal Medicine | Admitting: Internal Medicine

## 2016-10-27 ENCOUNTER — Encounter (HOSPITAL_COMMUNITY)
Admission: RE | Disposition: A | Discharge status: Home / Self Care | Payer: Self-pay | Source: Ambulatory Visit | Attending: Internal Medicine

## 2016-10-27 DIAGNOSIS — F17210 Nicotine dependence, cigarettes, uncomplicated: Secondary | ICD-10-CM | POA: Insufficient documentation

## 2016-10-27 DIAGNOSIS — I7092 Chronic total occlusion of artery of the extremities: Secondary | ICD-10-CM | POA: Insufficient documentation

## 2016-10-27 DIAGNOSIS — E669 Obesity, unspecified: Secondary | ICD-10-CM | POA: Insufficient documentation

## 2016-10-27 DIAGNOSIS — Z794 Long term (current) use of insulin: Secondary | ICD-10-CM | POA: Insufficient documentation

## 2016-10-27 DIAGNOSIS — Z6839 Body mass index (BMI) 39.0-39.9, adult: Secondary | ICD-10-CM | POA: Insufficient documentation

## 2016-10-27 DIAGNOSIS — E1142 Type 2 diabetes mellitus with diabetic polyneuropathy: Secondary | ICD-10-CM | POA: Insufficient documentation

## 2016-10-27 DIAGNOSIS — E1151 Type 2 diabetes mellitus with diabetic peripheral angiopathy without gangrene: Secondary | ICD-10-CM | POA: Insufficient documentation

## 2016-10-27 DIAGNOSIS — Z951 Presence of aortocoronary bypass graft: Secondary | ICD-10-CM | POA: Insufficient documentation

## 2016-10-27 DIAGNOSIS — Z8249 Family history of ischemic heart disease and other diseases of the circulatory system: Secondary | ICD-10-CM | POA: Insufficient documentation

## 2016-10-27 DIAGNOSIS — Z7902 Long term (current) use of antithrombotics/antiplatelets: Secondary | ICD-10-CM | POA: Insufficient documentation

## 2016-10-27 DIAGNOSIS — E785 Hyperlipidemia, unspecified: Secondary | ICD-10-CM | POA: Insufficient documentation

## 2016-10-27 DIAGNOSIS — I739 Peripheral vascular disease, unspecified: Secondary | ICD-10-CM | POA: Diagnosis present

## 2016-10-27 DIAGNOSIS — I10 Essential (primary) hypertension: Secondary | ICD-10-CM | POA: Insufficient documentation

## 2016-10-27 DIAGNOSIS — I2510 Atherosclerotic heart disease of native coronary artery without angina pectoris: Secondary | ICD-10-CM | POA: Insufficient documentation

## 2016-10-27 DIAGNOSIS — Z7982 Long term (current) use of aspirin: Secondary | ICD-10-CM | POA: Insufficient documentation

## 2016-10-27 DIAGNOSIS — I255 Ischemic cardiomyopathy: Secondary | ICD-10-CM | POA: Insufficient documentation

## 2016-10-27 DIAGNOSIS — I252 Old myocardial infarction: Secondary | ICD-10-CM | POA: Insufficient documentation

## 2016-10-27 DIAGNOSIS — I70213 Atherosclerosis of native arteries of extremities with intermittent claudication, bilateral legs: Secondary | ICD-10-CM | POA: Insufficient documentation

## 2016-10-27 HISTORY — PX: ABDOMINAL AORTOGRAM W/LOWER EXTREMITY: CATH118223

## 2016-10-27 LAB — GLUCOSE, CAPILLARY
Glucose-Capillary: 146 mg/dL — ABNORMAL HIGH (ref 65–99)
Glucose-Capillary: 148 mg/dL — ABNORMAL HIGH (ref 65–99)

## 2016-10-27 SURGERY — ABDOMINAL AORTOGRAM W/LOWER EXTREMITY
Anesthesia: LOCAL

## 2016-10-27 MED ORDER — FENTANYL CITRATE (PF) 100 MCG/2ML IJ SOLN
INTRAMUSCULAR | Status: DC | PRN
  Administered 2016-10-27: 50 ug via INTRAVENOUS

## 2016-10-27 MED ORDER — SODIUM CHLORIDE 0.9 % IV SOLN: 250.0000 mL | INTRAVENOUS | Status: DC | PRN

## 2016-10-27 MED ORDER — LIDOCAINE HCL (PF) 1 % IJ SOLN
INTRAMUSCULAR | Status: AC
  Filled 2016-10-27: qty 30

## 2016-10-27 MED ORDER — SODIUM CHLORIDE 0.9 % WEIGHT BASED INFUSION
3.0000 mL/kg/h | INTRAVENOUS | Status: AC
  Administered 2016-10-27: 3 mL/kg/h via INTRAVENOUS

## 2016-10-27 MED ORDER — FENTANYL CITRATE (PF) 100 MCG/2ML IJ SOLN
INTRAMUSCULAR | Status: AC
  Filled 2016-10-27: qty 2

## 2016-10-27 MED ORDER — IODIXANOL 320 MG/ML IV SOLN
INTRAVENOUS | Status: DC | PRN
  Administered 2016-10-27: 125 mL via INTRAVENOUS

## 2016-10-27 MED ORDER — SODIUM CHLORIDE 0.9 % WEIGHT BASED INFUSION: 1.0000 mL/kg/h | INTRAVENOUS | Status: DC

## 2016-10-27 MED ORDER — ASPIRIN 81 MG PO CHEW
CHEWABLE_TABLET | ORAL | Status: AC
  Administered 2016-10-27: 81 mg via ORAL
  Filled 2016-10-27: qty 1

## 2016-10-27 MED ORDER — MIDAZOLAM HCL 2 MG/2ML IJ SOLN
INTRAMUSCULAR | Status: AC
Start: 2016-10-27 — End: ?
  Filled 2016-10-27: qty 2

## 2016-10-27 MED ORDER — SODIUM CHLORIDE 0.9% FLUSH: 3.0000 mL | INTRAVENOUS | Status: DC | PRN

## 2016-10-27 MED ORDER — SODIUM CHLORIDE 0.9% FLUSH: 3.0000 mL | Freq: Two times a day (BID) | INTRAVENOUS | Status: DC

## 2016-10-27 MED ORDER — LIDOCAINE HCL (PF) 1 % IJ SOLN
INTRAMUSCULAR | Status: DC | PRN
  Administered 2016-10-27: 18 mL

## 2016-10-27 MED ORDER — ASPIRIN 81 MG PO CHEW
81.0000 mg | CHEWABLE_TABLET | ORAL | Status: AC
  Administered 2016-10-27: 81 mg via ORAL

## 2016-10-27 MED ORDER — HEPARIN (PORCINE) IN NACL 2-0.9 UNIT/ML-% IJ SOLN
INTRAMUSCULAR | Status: AC
  Filled 2016-10-27: qty 1000

## 2016-10-27 MED ORDER — MIDAZOLAM HCL 2 MG/2ML IJ SOLN
INTRAMUSCULAR | Status: DC | PRN
  Administered 2016-10-27: 1 mg via INTRAVENOUS

## 2016-10-27 MED ORDER — HEPARIN (PORCINE) IN NACL 2-0.9 UNIT/ML-% IJ SOLN
INTRAMUSCULAR | Status: AC | PRN
Start: 2016-10-27 — End: 2016-10-27
  Administered 2016-10-27: 1000 mL

## 2016-10-27 MED ORDER — SODIUM CHLORIDE 0.9 % IV SOLN: INTRAVENOUS | Status: AC

## 2016-10-27 SURGICAL SUPPLY — 11 items
CATH OMNI FLUSH 5F 65CM (CATHETERS) ×2 IMPLANT
COVER PRB 48X5XTLSCP FOLD TPE (BAG) ×1 IMPLANT
COVER PROBE 5X48 (BAG) ×1
KIT MICROINTRODUCER STIFF 5F (SHEATH) ×2 IMPLANT
KIT PV (KITS) ×2 IMPLANT
SHEATH PINNACLE 5F 10CM (SHEATH) ×2 IMPLANT
SYRINGE MEDRAD AVANTA MACH 7 (SYRINGE) ×2 IMPLANT
TRANSDUCER W/STOPCOCK (MISCELLANEOUS) ×2 IMPLANT
TRAY PV CATH (CUSTOM PROCEDURE TRAY) ×2 IMPLANT
TUBING HIGH PRESSURE 120CM (CONNECTOR) ×2 IMPLANT
WIRE HITORQ VERSACORE ST 145CM (WIRE) ×2 IMPLANT

## 2016-10-27 NOTE — Interval H&P Note (Signed)
History and Physical Interval Note:  10/27/2016 7:20 AM  Melinda [name]  has presented today for surgery, with the diagnosis of claudication. The various methods of treatment have been discussed with the patient and family. After consideration of risks, benefits and other options for treatment, the patient has consented to  Procedure(s): Abdominal Aortogram w/Lower Extremity (N/A) as a surgical intervention .  The patient's history has been reviewed, patient examined, no change in status, stable for surgery.  I have reviewed the patient's chart and labs.  Questions were answered to the patient's satisfaction.     [name]

## 2016-10-27 NOTE — Brief Op Note (Signed)
Brief PV Note  Date: 10/27/2016 Time: 8:25 AM  PATIENT:  Melinda [name]  61 y.o. female  PRE-OPERATIVE DIAGNOSIS:  claudicaiton  POST-OPERATIVE DIAGNOSIS: Same  PROCEDURE:  Procedure(s): Abdominal Aortogram w/Lower Extremity (N/A)  SURGEON:  Surgeon(s) and Role:    * Hassen Bruun, MD - Primary  FINDINGS: 1. CTO of left proximal and mid SFA 2. CTO of right popliteal artery.  RECOMMENDATIONS: 1. Aggressive medical therapy. 2. Supervised exercise program. 3. Could consider endovascular revascularization of left SFA CTO if symptoms do not improve.  Nelva Bush, MD [facility] Pager: [phone]

## 2016-10-27 NOTE — Discharge Instructions (Signed)
Angiogram, Care After °This sheet gives you information about how to care for yourself after your procedure. Your health care provider may also give you more specific instructions. If you have problems or questions, contact your health care provider. °What can I expect after the procedure? °After the procedure, it is common to have bruising and tenderness at the catheter insertion area. °Follow these instructions at home: °Insertion site care  °· Follow instructions from your health care provider about how to take care of your insertion site. Make sure you: °¨ Wash your hands with soap and water before you change your bandage (dressing). If soap and water are not available, use hand sanitizer. °¨ Change your dressing as told by your health care provider. °¨ Leave stitches (sutures), skin glue, or adhesive strips in place. These skin closures may need to stay in place for 2 weeks or longer. If adhesive strip edges start to loosen and curl up, you may trim the loose edges. Do not remove adhesive strips completely unless your health care provider tells you to do that. °· Do not take baths, swim, or use a hot tub until your health care provider approves. °· You may shower 24-48 hours after the procedure or as told by your health care provider. °¨ Gently wash the site with plain soap and water. °¨ Pat the area dry with a clean towel. °¨ Do not rub the site. This may cause bleeding. °· Do not apply powder or lotion to the site. Keep the site clean and dry. °· Check your insertion site every day for signs of infection. Check for: °¨ Redness, swelling, or pain. °¨ Fluid or blood. °¨ Warmth. °¨ Pus or a bad smell. °Activity  °· Rest as told by your health care provider, usually for 1-2 days. °· Do not lift anything that is heavier than 10 lbs. (4.5 kg) or as told by your health care provider. °· Do not drive for 24 hours if you were given a medicine to help you relax (sedative). °· Do not drive or use heavy machinery while  taking prescription pain medicine. °General instructions  °· Return to your normal activities as told by your health care provider, usually in about a week. Ask your health care provider what activities are safe for you. °· If the catheter site starts bleeding, lie flat and put pressure on the site. If the bleeding does not stop, get help right away. This is a medical emergency. °· Drink enough fluid to keep your urine clear or pale yellow. This helps flush the contrast dye from your body. °· Take over-the-counter and prescription medicines only as told by your health care provider. °· Keep all follow-up visits as told by your health care provider. This is important. °Contact a health care provider if: °· You have a fever or chills. °· You have redness, swelling, or pain around your insertion site. °· You have fluid or blood coming from your insertion site. °· The insertion site feels warm to the touch. °· You have pus or a bad smell coming from your insertion site. °· You have bruising around the insertion site. °· You notice blood collecting in the tissue around the catheter site (hematoma). The hematoma may be painful to the touch. °Get help right away if: °· You have severe pain at the catheter insertion area. °· The catheter insertion area swells very fast. °· The catheter insertion area is bleeding, and the bleeding does not stop when you hold steady pressure on   the area. °· The area near or just beyond the catheter insertion site becomes pale, cool, tingly, or numb. °These symptoms may represent a serious problem that is an emergency. Do not wait to see if the symptoms will go away. Get medical help right away. Call your local emergency services (911 in the [place] Do not drive yourself to the hospital. °Summary °· After the procedure, it is common to have bruising and tenderness at the catheter insertion area. °· After the procedure, it is important to rest and drink plenty of fluids. °· Do not take baths,  swim, or use a hot tub until your health care provider says it is okay to do so. You may shower 24-48 hours after the procedure or as told by your health care provider. °· If the catheter site starts bleeding, lie flat and put pressure on the site. If the bleeding does not stop, get help right away. This is a medical emergency. °This information is not intended to replace advice given to you by your health care provider. Make sure you discuss any questions you have with your health care provider. °Document Released: 11/03/2004 Document Revised: 03/22/2016 Document Reviewed: 03/22/2016 °Elsevier Interactive Patient Education © 2017 Elsevier Inc. ° °

## 2016-10-27 NOTE — Progress Notes (Signed)
Site area: Right groin a 5 french arterial sheath was removed  Site Prior to Removal:  Level 0  Pressure Applied For 20 MINUTES    Bedrest Beginning at 0850a  Manual:   Yes.    Patient Status During Pull:  stable  Post Pull Groin Site:  Level 0  Post Pull Instructions Given:  Yes.    Post Pull Pulses Present:  Yes.    Dressing Applied:  Yes.    Comments:  VS remain stable

## 2016-10-27 NOTE — H&P (View-Only) (Signed)
Outpatient Peripheral Vascular Consultation Date: 10/18/2016  Referring Provider Esmond Plants, MD [facility], [address] [place], [place] [address]  Chief Complaint: Claudication and abnormal ABIs  HPI:  Melinda [name] is a 61 y.o. year-old female with history of coronary artery disease status post 4 vessel CABG in 06/2014, hypertension, tobacco abuse, diabetes, obesity, and peripheral neuropathy, who has been referred by Dr. Rockey Situ for evaluation of claudication and abnormal vascular studies. She follows with Dr. Rockey Situ, and was last seen in 03/2016. Vascular studies were ordered at that time, though patient canceled or no showed for multiple appointments. She finally had vascular studies last week, which were significantly abnormal (see details below). 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 She notes that her pain has continued to progress and is located from her thighs distally to her feet. There is both a burning quality as well as an ache. The burning is there almost all the time, though the ache worsened significantly with even short walks (less than 50 feet). She does not have any open wounds at this time, but notes that it takes quite a while for small wounds on her shins to heal.  Melinda [name] has intermittent exertional dyspnea. She has not had any chest pain, palpitations, or lightheadedness. Earlier this year, she lost her insurance and was off of all of her medications for at least 2 months. [facility] and has restarted many of her medications. However, she remains off gabapentin, which which she previously took for peripheral neuropathy.  --------------------------------------------------------------------------------------------------  Cardiovascular History & Procedures: Cardiovascular Problems:  Coronary artery  disease status post NSTEMI and CABG (2016)  Peripheral vascular disease with claudication  Ischemic cardiomyopathy  Risk Factors:  Known CAD, PVD, hypertension, hyperlipidemia, diabetes mellitus, sedentary lifestyle, obesity, and tobacco use  Cath/PCI:  LHC (06/2014): 3 vessel disease; further details not available  CV Surgery:  CABG (06/09/14, Dr. Jobie Quaker): LIMA to LAD, SVG to diagonal, SVG to OM, and SVG to PDA.  EP Procedures and Devices:  None  Non-Invasive Evaluation(s):  Bilateral lower extremity arterial Doppler (10/11/16): Technically challenging aorto-iliac portion. >50% right common iliac artery stenosis. Diffuse heterogeneous plaque, bilaterally. 50-74% right mid popliteal artery stenosis. Occluded left proximal SFA. Two-vessel run-off seen, bilaterally.  Bilateral ABI (10/11/16): Right colon 0.50, left 0.60.  TTE (06/04/14): Normal LV size with moderate LVH. LVEF 40-45% with severe hypokinesis of the inferolateral and inferior myocardium. Grade 2 diastolic dysfunction. Mild MR. Normal RV size and function.  Recent CV Pertinent Labs: Lab Results  Component Value Date   CHOL 214 (H) 10/10/2016   CHOL 94 06/02/2014   HDL 42 10/10/2016   HDL 39 (L) 06/02/2014   LDLCALC 103 (H) 10/10/2016   LDLCALC 31 06/02/2014   TRIG 346 (H) 10/10/2016   TRIG 122 06/02/2014   CHOLHDL 5.1 (H) 10/10/2016   INR 1.28 06/08/2014   INR 1.1 06/03/2014   K 4.4 10/10/2016   K 4.1 06/03/2014   MG 2.3 06/09/2014   BUN 16 10/10/2016   BUN 13 06/03/2014   CREATININE 0.92 10/10/2016   CREATININE 1.13 06/03/2014    Past medical and surgical history were reviewed and updated in EPIC.  Outpatient Encounter Prescriptions as of 10/18/2016  Medication Sig  . aspirin EC 81 MG EC tablet Take 1 tablet (81 mg total) by mouth daily.  Marland Kitchen  atorvastatin (LIPITOR) 80 MG tablet Take 1 tablet (80 mg total) by mouth daily at 6 PM.  . clopidogrel (PLAVIX) 75 MG tablet Take 1 tablet (75 mg total) by mouth  daily.  . furosemide (LASIX) 20 MG tablet Take 1 tablet (20 mg total) by mouth daily as needed.  . insulin glargine (LANTUS) 100 UNIT/ML injection Inject 0.6 mLs (60 Units total) into the skin 2 (two) times daily.  . Insulin Syringe-Needle U-100 (B-D INS SYR ULTRAFINE 1CC/30G) 30G X 1/2" 1 ML MISC Inject sixty units of long-acting insulin twice a day  . lisinopril (PRINIVIL,ZESTRIL) 40 MG tablet Take 1 tablet (40 mg total) by mouth daily.  . metoprolol tartrate (LOPRESSOR) 25 MG tablet Take 1 tablet (25 mg total) by mouth 2 (two) times daily.  Marland Kitchen oxybutynin (DITROPAN-XL) 5 MG 24 hr tablet Take 1 tablet (5 mg total) by mouth 2 (two) times daily.  . potassium chloride SA (K-DUR,KLOR-CON) 20 MEQ tablet Take 1 tablet (20 mEq total) by mouth daily as needed.  . varenicline (CHANTIX STARTING MONTH PAK) 0.5 MG X 11 & 1 MG X 42 tablet Take one 0.5 mg tablet by mouth once daily for 3 days, then increase to one 0.5 mg tablet twice daily for 4 days, then increase to one 1 mg tablet twice daily.  . [DISCONTINUED] albuterol (PROVENTIL) (2.5 MG/3ML) 0.083% nebulizer solution Take 3 mLs (2.5 mg total) by nebulization every 2 (two) hours as needed for wheezing. (Patient not taking: Reported on 10/18/2016)   No facility-administered encounter medications on file as of 10/18/2016.     Allergies: Patient has no known allergies.  Social History   Social History  . Marital status: Widowed    Spouse name: N/A  . Number of children: N/A  . Years of education: N/A   Occupational History  . Not on file.   Social History Main Topics  . Smoking status: Current Every Day Smoker    Packs/day: 0.50    Years: 40.00    Types: Cigarettes  . Smokeless tobacco: Never Used  . Alcohol use No  . Drug use: No  . Sexual activity: Not on file   Other Topics Concern  . Not on file   Social History Narrative  . No narrative on file    Family History  Problem Relation Age of Onset  . CAD Mother   . Hypertension  Mother   . Diabetes Mother   . CAD Father   . Diabetes Father   . Sickle cell trait Other   . Asthma Other     Review of Systems: A 12-system review of systems was performed and was negative except as noted in the HPI.  --------------------------------------------------------------------------------------------------  Physical Exam: BP 124/66 (BP Location: Left Arm, Patient Position: Sitting, Cuff Size: Normal)   Ht 5\' 8"  (1.727 m)   Wt 262 lb 4 oz (119 kg)   BMI 39.87 kg/m   General:  Obese woman, seated comfortably on the exam table. HEENT: No conjunctival pallor or scleral icterus.  Moist mucous membranes.  OP clear. Neck: Supple without lymphadenopathy, thyromegaly, JVD, or HJR. Lungs: Normal work of breathing.  Clear to auscultation bilaterally without wheezes or crackles. Heart: Regular rate and rhythm without murmurs, rubs, or gallops.  Non-displaced PMI. Abd: Bowel sounds present.  Soft, NT/ND without hepatosplenomegaly Ext: No lower extremity edema.  Third toes are surgically absent bilaterally. Radial pulses are 2+ bilaterally. Right femoral pulses 1+. Left femoral pulses trace. Pedal pulses are nonpalpable bilaterally.  Skin: warm and dry without rash  Lab Results  Component Value Date   WBC 9.0 10/10/2016   HGB 13.1 10/10/2016   HCT 40.6 10/10/2016   MCV 78 (L) 10/10/2016   PLT 301 10/10/2016    Lab Results  Component Value Date   NA 139 10/10/2016   K 4.4 10/10/2016   CL 98 10/10/2016   CO2 26 10/10/2016   BUN 16 10/10/2016   CREATININE 0.92 10/10/2016   GLUCOSE 314 (H) 10/10/2016   ALT 13 10/10/2016    Lab Results  Component Value Date   CHOL 214 (H) 10/10/2016   HDL 42 10/10/2016   LDLCALC 103 (H) 10/10/2016   TRIG 346 (H) 10/10/2016   CHOLHDL 5.1 (H) 10/10/2016    --------------------------------------------------------------------------------------------------  ASSESSMENT AND PLAN: Peripheral arterial disease with claudication Melinda [name]  has long-standing bilateral lower extremity pain, which is likely a combination of neuropathy and claudication. She has claudication with minimal activity, consistent with Rutherford stage III. 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 Recent vascular studies suggest evidence of bilateral inflow and outflow disease. We have discussed further evaluation and treatment options; I have recommended abdominal aortography with runoff and possible intervention. The risks and benefits of the study were discussed with the patient, who would like to proceed. We will obtain CBC, BMP, and INR today. I will not make any medication changes at this time; she should continue dual antiplatelet therapy with aspirin and clopidogrel. I advised the patient to stop smoking.  Coronary artery disease No recurrent chest pain, though she reports some exertional dyspnea. She will follow-up with Dr. Cammie Sickle this week. I will defer management to him.  Hypertension Blood pressure is well controlled. No medication changes today.  Hyperlipidemia Labs earlier this month are notable for an LDL of 103, which is above goal (goal less than 70). She is already on atorvastatin 80 mg daily, though she has had issues with medication compliance recently due to insurance issues. I will defer further management to Drs. Gollan and Aycock.  Follow-up: To be determined based on angiography.  Nelva Bush, MD 10/18/2016 9:42 PM

## 2016-10-30 ENCOUNTER — Encounter (HOSPITAL_COMMUNITY): Payer: Self-pay | Admitting: Internal Medicine

## 2016-11-02 ENCOUNTER — Telehealth: Payer: Self-pay | Admitting: Internal Medicine

## 2016-11-02 DIAGNOSIS — I10 Essential (primary) hypertension: Secondary | ICD-10-CM

## 2016-11-02 NOTE — Telephone Encounter (Signed)
Per Dr. Saunders Revel: "-BMP in the middle of this week to ensure stable kidney function.  -Referral for supervised exercise program (if possible; I hear that one is starting in July but may only be in [place]).  -Return to see me for follow-up in 1 month. "  Pt states she can not have labs today but will go to the [facility] lab tomorrow. Will call pt back with f/u appt and information on exercise program.

## 2016-11-03 ENCOUNTER — Telehealth: Payer: Self-pay | Admitting: Internal Medicine

## 2016-11-03 NOTE — Telephone Encounter (Addendum)
Pt had PV procedure on June 29. Follow up was scheduled for August 7 w/Ryan Dunn, [name] Pt needs to f/u two weeks post-procedure w/Dr. End. Offered 7/18, 4pm appt but pt states she can not make this. She is agreeable to July 24, 3:40pm  Pt to have labs today at the [facility]. Left message for cardiac rehab regarding exercise program.

## 2016-11-03 NOTE — Telephone Encounter (Signed)
S/w Arbie Cookey, RN, in Cardiac Rehab. Pt could participate in the [place]. Provided pt information to Arbie Cookey who will contact patient  Completed Physician Clearance. Dr. Fletcher Anon has signed for Dr. Saunders Revel as he is out the office.  Faxed to [employer], AttnArbie Cookey, [phone]

## 2016-11-09 ENCOUNTER — Telehealth: Payer: Self-pay | Admitting: *Deleted

## 2016-11-09 NOTE — Telephone Encounter (Signed)
If she is unable to get labs done before our f/u appointment, we can check them then.  Nelva Bush, MD [facility] Pager: [phone]

## 2016-11-09 NOTE — Telephone Encounter (Signed)
Patient was supposed to get BMP last week to ensure kidney function.  Patient has not gotten lab work at this time. Attempted to reach patient to remind her to go as soon as possible. No answer and mailbox full. Unable to leave a message.

## 2016-11-09 NOTE — Telephone Encounter (Signed)
Patient may not be able to get lab work due to transportation issues. She has to try to find someone who can bring her for the blood work and its very difficult for her because everyone she knows is working. She is aware of appt on 11/21/16 with Dr End. She will try to get up here in the next few days to the [facility]. Advised patient it would be good for her to have the labs to check her kidney function and she verbalized understanding.

## 2016-11-10 NOTE — Telephone Encounter (Signed)
No answer. Mailbox is full. Unable to leave message to give her the update.

## 2016-11-14 NOTE — Telephone Encounter (Signed)
No answer. Mailbox is full and cannot accept messages. Will address with patient at upcoming appt.

## 2016-11-21 ENCOUNTER — Ambulatory Visit: Payer: Self-pay | Admitting: Internal Medicine

## 2016-11-22 ENCOUNTER — Encounter: Payer: Self-pay | Admitting: Internal Medicine

## 2016-11-22 ENCOUNTER — Ambulatory Visit (INDEPENDENT_AMBULATORY_CARE_PROVIDER_SITE_OTHER): Payer: Medicare Other | Admitting: Internal Medicine

## 2016-11-22 VITALS — BP 120/62 | HR 81 | Ht 68.0 in | Wt 250.5 lb

## 2016-11-22 DIAGNOSIS — I2510 Atherosclerotic heart disease of native coronary artery without angina pectoris: Secondary | ICD-10-CM

## 2016-11-22 DIAGNOSIS — I739 Peripheral vascular disease, unspecified: Secondary | ICD-10-CM

## 2016-11-22 DIAGNOSIS — I255 Ischemic cardiomyopathy: Secondary | ICD-10-CM

## 2016-11-22 NOTE — Patient Instructions (Signed)
Medication Instructions:  Your physician recommends that you continue on your current medications as directed. Please refer to the Current Medication list given to you today.   Labwork: NONE  Testing/Procedures: NONE  Follow-Up: Your physician recommends that you schedule a follow-up appointment in: 3 MONTHS WITH DR END.  Call the [facility] at [phone] to schedule with exercise program. Dr End recommends that you walk on a regular basis.  Please call our office if you develop any sores on your legs or feet.   If you need a refill on your cardiac medications before your next appointment, please call your pharmacy.    Steps to Quit Smoking Smoking tobacco can be bad for your health. It can also affect almost every organ in your body. Smoking puts you and people around you at risk for many serious long-lasting (chronic) diseases. Quitting smoking is hard, but it is one of the best things that you can do for your health. It is never too late to quit. What are the benefits of quitting smoking? When you quit smoking, you lower your risk for getting serious diseases and conditions. They can include:  Lung cancer or lung disease.  Heart disease.  Stroke.  Heart attack.  Not being able to have children (infertility).  Weak bones (osteoporosis) and broken bones (fractures).  If you have coughing, wheezing, and shortness of breath, those symptoms may get better when you quit. You may also get sick less often. If you are pregnant, quitting smoking can help to lower your chances of having a baby of low birth weight. What can I do to help me quit smoking? Talk with your doctor about what can help you quit smoking. Some things you can do (strategies) include:  Quitting smoking totally, instead of slowly cutting back how much you smoke over a period of time.  Going to in-person counseling. You are more likely to quit if you go to many counseling sessions.  Using  resources and support systems, such as: ? Database administrator with a Social worker. ? Phone quitlines. ? Careers information officer. ? Support groups or group counseling. ? Text messaging programs. ? Mobile phone apps or applications.  Taking medicines. Some of these medicines may have nicotine in them. If you are pregnant or breastfeeding, do not take any medicines to quit smoking unless your doctor says it is okay. Talk with your doctor about counseling or other things that can help you.  Talk with your doctor about using more than one strategy at the same time, such as taking medicines while you are also going to in-person counseling. This can help make quitting easier. What things can I do to make it easier to quit? Quitting smoking might feel very hard at first, but there is a lot that you can do to make it easier. Take these steps:  Talk to your family and friends. Ask them to support and encourage you.  Call phone quitlines, reach out to support groups, or work with a Social worker.  Ask people who smoke to not smoke around you.  Avoid places that make you want (trigger) to smoke, such as: ? Bars. ? Parties. ? Smoke-break areas at work.  Spend time with people who do not smoke.  Lower the stress in your life. Stress can make you want to smoke. Try these things to help your stress: ? Getting regular exercise. ? Deep-breathing exercises. ? Yoga. ? Meditating. ? Doing a body scan. To do this, close your eyes, focus on  one area of your body at a time from head to toe, and notice which parts of your body are tense. Try to relax the muscles in those areas.  Download or buy apps on your mobile phone or tablet that can help you stick to your quit plan. There are many free apps, such as QuitGuide from the [employer] Office manager for Disease Control and Prevention). You can find more support from smokefree.gov and other websites.  This information is not intended to replace advice given to you by your health  care provider. Make sure you discuss any questions you have with your health care provider. Document Released: 02/11/2009 Document Revised: 12/14/2015 Document Reviewed: 09/01/2014 Elsevier Interactive Patient Education  2018 [employer].

## 2016-11-22 NOTE — Progress Notes (Signed)
Follow-up Outpatient Visit Date: 11/22/2016  Primary Care Provider: Donnie Coffin, MD [place] [id number]  Chief Complaint: Leg pain  HPI:  Melinda [name] is a 61 y.o. year-old female with history of coronary artery disease status post 4 vessel CABG in 06/2014, peripheral vascular disease, hypertension, tobacco abuse, diabetes, obesity, and peripheral neuropathy, who presents for follow-up of claudication. I last saw her on 10/18/16, which time she reported bilateral lower extremity pain that has been present for years. We subsequently performed abdominal aortogram with bilateral runoff. This showed chronic total occlusion of the right popliteal artery and left proximal and mid SFA.  Following aortogram and runoff, Ms. Mack noted pain on the lateral aspect of the left thigh. She describes it as an ache that is worsened by lying on the left side. It has gradually gotten better over the last few weeks. It is not related to activity. The numbness in her feet and cramping in her legs with ambulation his unchanged to minimally worse. She is only able to walk 50-100 feet before needing to stop. However, she is limited both by pain in her legs and chronic shortness of breath. She denies wounds on her feet. She denies chest pain and palpitations. She has not had any significant edema. She notes that her right groin arteriotomy site healed well.  --------------------------------------------------------------------------------------------------  Cardiovascular History & Procedures: Cardiovascular Problems:  Coronary artery disease status post NSTEMI and CABG (2016)  Peripheral vascular disease with claudication  Ischemic cardiomyopathy  Risk Factors:  Known CAD, PVD, hypertension, hyperlipidemia, diabetes mellitus, sedentary lifestyle, obesity, and tobacco use  Cath/PCI:  Abdominal aortogram and bilateral lower extremity runoff (10/27/16): Mild plaquing of the  infrarenal aorta. Mild bilateral inflow disease. Right SFA with 40-50% distal stenosis. P2 segment of popliteal artery is occluded with reconstitution of the ATA, PTA, and peroneal arteries via collaterals. There is chronic total occlusion of the left SFA proximally with reconstitution of the distal vessel above the abductor canal. Three-vessel runoff on the left.  LHC (06/2014): 3 vessel disease; further details not available  CV Surgery:  CABG (06/09/14, Dr. Servando Snare): LIMA to LAD, SVG to diagonal, SVG to OM, and SVG to PDA.  EP Procedures and Devices:  None  Non-Invasive Evaluation(s):  Bilateral lower extremity arterial Doppler (10/11/16): Technically challenging aorto-iliac portion. >50% right common iliac artery stenosis. Diffuse heterogeneous plaque, bilaterally. 50-74% right mid popliteal artery stenosis. Occluded left proximal SFA. Two-vessel run-off seen, bilaterally.  Bilateral ABI (10/11/16): Right colon 0.50, left 0.60.  TTE (06/04/14): Normal LV size with moderate LVH. LVEF 40-45% with severe hypokinesis of the inferolateral and inferior myocardium. Grade 2 diastolic dysfunction. Mild MR. Normal RV size and function.  Recent CV Pertinent Labs: Lab Results  Component Value Date   CHOL 214 (H) 10/10/2016   CHOL 94 06/02/2014   HDL 42 10/10/2016   HDL 39 (L) 06/02/2014   LDLCALC 103 (H) 10/10/2016   LDLCALC 31 06/02/2014   TRIG 346 (H) 10/10/2016   TRIG 122 06/02/2014   CHOLHDL 5.1 (H) 10/10/2016   INR 1.05 10/20/2016   INR 1.1 06/03/2014   K 3.9 10/20/2016   K 4.1 06/03/2014   MG 2.3 06/09/2014   BUN 24 (H) 10/20/2016   BUN 16 10/10/2016   BUN 13 06/03/2014   CREATININE 1.41 (H) 10/20/2016   CREATININE 1.13 06/03/2014    Past medical and surgical history were reviewed and updated in EPIC.  Current Meds  Medication Sig  .  aspirin EC 81 MG EC tablet Take 1 tablet (81 mg total) by mouth daily.  Marland Kitchen atorvastatin (LIPITOR) 80 MG tablet Take 1 tablet (80 mg total) by  mouth daily at 6 PM.  . clopidogrel (PLAVIX) 75 MG tablet Take 1 tablet (75 mg total) by mouth daily.  . DULoxetine HCl (CYMBALTA PO) Take by mouth 2 (two) times daily.  . furosemide (LASIX) 20 MG tablet Take 1 tablet (20 mg total) by mouth daily as needed.  . insulin glargine (LANTUS) 100 UNIT/ML injection Inject 0.6 mLs (60 Units total) into the skin 2 (two) times daily.  . Insulin Syringe-Needle U-100 (B-D INS SYR ULTRAFINE 1CC/30G) 30G X 1/2" 1 ML MISC Inject sixty units of long-acting insulin twice a day  . lisinopril (PRINIVIL,ZESTRIL) 40 MG tablet Take 1 tablet (40 mg total) by mouth daily.  . metoprolol tartrate (LOPRESSOR) 25 MG tablet Take 1 tablet (25 mg total) by mouth 2 (two) times daily.  Marland Kitchen oxybutynin (DITROPAN-XL) 5 MG 24 hr tablet Take 1 tablet (5 mg total) by mouth 2 (two) times daily.  . potassium chloride SA (K-DUR,KLOR-CON) 20 MEQ tablet Take 1 tablet (20 mEq total) by mouth daily as needed.    Allergies: Patient has no known allergies.  Social History   Social History  . Marital status: Widowed    Spouse name: N/A  . Number of children: N/A  . Years of education: N/A   Occupational History  . Not on file.   Social History Main Topics  . Smoking status: Current Every Day Smoker    Packs/day: 0.25    Years: 40.00    Types: Cigarettes  . Smokeless tobacco: Never Used     Comment: 4-5 a day  . Alcohol use No  . Drug use: No  . Sexual activity: Not on file   Other Topics Concern  . Not on file   Social History Narrative  . No narrative on file    Family History  Problem Relation Age of Onset  . CAD Mother   . Hypertension Mother   . Diabetes Mother   . CAD Father   . Diabetes Father   . Sickle cell trait Other   . Asthma Other     Review of Systems: A 12-system review of systems was performed and was negative except as noted in the  HPI.  --------------------------------------------------------------------------------------------------  Physical Exam: BP 120/62 (BP Location: Left Arm, Patient Position: Sitting, Cuff Size: Large)   Pulse 81   Ht 5\' 8"  (1.727 m)   Wt 250 lb 8 oz (113.6 kg)   BMI 38.09 kg/m   General:  Obese woman, seated comfortably in the exam room. HEENT: No conjunctival pallor or scleral icterus. Moist mucous membranes.  OP clear. Neck: Supple without lymphadenopathy, thyromegaly, JVD, or HJR. No carotid bruit. Lungs: Normal work of breathing. Clear to auscultation bilaterally without wheezes or crackles. Heart: Regular rate and rhythm with occasional extrasystoles. No murmurs, rubs, or gallops. Non-displaced PMI. Abd: Bowel sounds present. Soft, NT/ND without hepatosplenomegaly Ext: No lower extremity edema. Right femoral arteriotomy site is well-healed without hematoma. 2+ femoral pulses bilaterally. Trace pedal pulses bilaterally.. Skin: Warm and dry without rash. No wounds on either foot.  EKG:  Sinus rhythm with PVCs, inferior Q waves, and lateral T-wave inversions.  Lab Results  Component Value Date   WBC 7.5 10/20/2016   HGB 12.7 10/20/2016   HCT 39.1 10/20/2016   MCV 77.1 (L) 10/20/2016   PLT 273 10/20/2016  Lab Results  Component Value Date   NA 143 10/20/2016   K 3.9 10/20/2016   CL 107 10/20/2016   CO2 28 10/20/2016   BUN 24 (H) 10/20/2016   CREATININE 1.41 (H) 10/20/2016   GLUCOSE 213 (H) 10/20/2016   ALT 13 10/10/2016    Lab Results  Component Value Date   CHOL 214 (H) 10/10/2016   HDL 42 10/10/2016   LDLCALC 103 (H) 10/10/2016   TRIG 346 (H) 10/10/2016   CHOLHDL 5.1 (H) 10/10/2016    --------------------------------------------------------------------------------------------------  ASSESSMENT AND PLAN: Claudication and peripheral vascular disease Overall symptoms are stable to slightly worse, per the patient's report. She has also had achiness along the  left lateral thigh following angiogram last month, which I do not believe is vascular in nature. Exam today is unrevealing except for continued diminished pedal pulses. Unfortunately, we have not been able to get her enrolled in a supervised walking program. Ms. Standen also continues to smoke. We discussed conservative treatment, including walking on a regular basis, versus intervention. Given that she does not have critical limb ischemia and has complex disease involving the left SFA and right popliteal artery with a low likelihood for durable patency given length of the chronic total occlusions (especially on the left), we have agreed to pursue a conservative for the time being. We will continue to work on trying to get her into a supervised program. In the meantime, I have asked her to try to walk. We will continue her current medication regimen for secondary prevention. I advised her to contact us if her pain worsens or if she develops wounds on her lower extremities.  Coronary artery disease and ischemic cardiomyopathy No new symptoms. Ms. Traynham appears euvolemic on exam. She should continue to follow with Dr. Rockey Situ.  Follow-up: Return to clinic in 3 months.  Nelva Bush, MD 11/22/2016 2:08 PM

## 2016-11-23 ENCOUNTER — Telehealth: Payer: Self-pay | Admitting: *Deleted

## 2016-11-23 NOTE — Telephone Encounter (Signed)
S/w Estill Bamberg from the [employer]. They do have patient clearance form and have tried to contact the patient without success. Confirmed patient's number is correct. They will continue to reach out to patient to set up. Patient has number as well and was encouraged to call the program at office visit yesterday.

## 2016-12-05 ENCOUNTER — Ambulatory Visit: Payer: Self-pay | Admitting: Internal Medicine

## 2016-12-05 ENCOUNTER — Ambulatory Visit: Payer: Self-pay | Admitting: Physician Assistant

## 2016-12-07 ENCOUNTER — Ambulatory Visit: Payer: Self-pay | Admitting: Podiatry

## 2016-12-25 ENCOUNTER — Ambulatory Visit: Payer: Medicare Other | Admitting: Podiatry

## 2017-02-05 ENCOUNTER — Emergency Department
Admission: EM | Admit: 2017-02-05 | Discharge: 2017-02-05 | Disposition: A | Discharge status: Home / Self Care | Payer: Medicare Other | Attending: Emergency Medicine | Admitting: Emergency Medicine

## 2017-02-05 ENCOUNTER — Encounter: Payer: Self-pay | Admitting: Emergency Medicine

## 2017-02-05 ENCOUNTER — Emergency Department: Payer: Medicare Other

## 2017-02-05 DIAGNOSIS — Z7902 Long term (current) use of antithrombotics/antiplatelets: Secondary | ICD-10-CM | POA: Insufficient documentation

## 2017-02-05 DIAGNOSIS — X58XXXA Exposure to other specified factors, initial encounter: Secondary | ICD-10-CM | POA: Diagnosis not present

## 2017-02-05 DIAGNOSIS — Y939 Activity, unspecified: Secondary | ICD-10-CM | POA: Insufficient documentation

## 2017-02-05 DIAGNOSIS — S91144A Puncture wound with foreign body of right lesser toe(s) without damage to nail, initial encounter: Secondary | ICD-10-CM | POA: Insufficient documentation

## 2017-02-05 DIAGNOSIS — Y999 Unspecified external cause status: Secondary | ICD-10-CM | POA: Diagnosis not present

## 2017-02-05 DIAGNOSIS — F17210 Nicotine dependence, cigarettes, uncomplicated: Secondary | ICD-10-CM | POA: Insufficient documentation

## 2017-02-05 DIAGNOSIS — Z794 Long term (current) use of insulin: Secondary | ICD-10-CM | POA: Diagnosis not present

## 2017-02-05 DIAGNOSIS — E1140 Type 2 diabetes mellitus with diabetic neuropathy, unspecified: Secondary | ICD-10-CM | POA: Diagnosis not present

## 2017-02-05 DIAGNOSIS — Z8673 Personal history of transient ischemic attack (TIA), and cerebral infarction without residual deficits: Secondary | ICD-10-CM | POA: Insufficient documentation

## 2017-02-05 DIAGNOSIS — L97529 Non-pressure chronic ulcer of other part of left foot with unspecified severity: Secondary | ICD-10-CM

## 2017-02-05 DIAGNOSIS — E11621 Type 2 diabetes mellitus with foot ulcer: Secondary | ICD-10-CM | POA: Diagnosis not present

## 2017-02-05 DIAGNOSIS — Z7982 Long term (current) use of aspirin: Secondary | ICD-10-CM | POA: Diagnosis not present

## 2017-02-05 DIAGNOSIS — I2510 Atherosclerotic heart disease of native coronary artery without angina pectoris: Secondary | ICD-10-CM | POA: Insufficient documentation

## 2017-02-05 DIAGNOSIS — Z79899 Other long term (current) drug therapy: Secondary | ICD-10-CM | POA: Diagnosis not present

## 2017-02-05 DIAGNOSIS — E10621 Type 1 diabetes mellitus with foot ulcer: Secondary | ICD-10-CM

## 2017-02-05 DIAGNOSIS — Z951 Presence of aortocoronary bypass graft: Secondary | ICD-10-CM | POA: Diagnosis not present

## 2017-02-05 DIAGNOSIS — L97519 Non-pressure chronic ulcer of other part of right foot with unspecified severity: Secondary | ICD-10-CM | POA: Diagnosis not present

## 2017-02-05 DIAGNOSIS — Y929 Unspecified place or not applicable: Secondary | ICD-10-CM | POA: Insufficient documentation

## 2017-02-05 DIAGNOSIS — I10 Essential (primary) hypertension: Secondary | ICD-10-CM | POA: Insufficient documentation

## 2017-02-05 DIAGNOSIS — M79674 Pain in right toe(s): Secondary | ICD-10-CM | POA: Diagnosis present

## 2017-02-05 MED ORDER — CEPHALEXIN 500 MG PO CAPS: 500.0000 mg | ORAL_CAPSULE | Freq: Two times a day (BID) | ORAL | 0 refills | Status: DC

## 2017-02-05 NOTE — ED Triage Notes (Signed)
Pt with right fifth toe discoloration for one week, no feeling in same. Hx of toe amputation in same foot.

## 2017-02-05 NOTE — Discharge Instructions (Signed)
please call the number for podiatry to arrange a follow-up appointment as soon as possible. Return to the emergency department for any fever, or worsening condition of your foot. Please take your antibiotics as prescribed for the next 10 days.

## 2017-02-05 NOTE — ED Provider Notes (Signed)
[facility] Emergency Department Provider Note  Time seen: 9:32 AM  I have reviewed the triage vital signs and the nursing notes.   HISTORY  Chief Complaint foot problems    HPI Melinda [name] is a 61 y.o. female With a past medical history of CAD status post STEMI status post CABG, diabetes, neuropathy, hypertension, presents to the emergency department for discoloration of her right fifth toe. According to the patient the past one week she has noted some discoloration of the right fifth toe. Denies any pain, but states a history of significant neuropathy at baseline. Denies any fever, nausea or vomiting. Largely negative review of systems. Patient has had 2 prior toe amputations one on each foot.  Past Medical History:  Diagnosis Date  . Anxiety   . Coronary artery disease    a. NSTEMI 06/2014; b. 4 vessel CABG 06/08/2014 LIMA to LAD, SVG to Diagonal, SVG to OM, & SVG to PDA  . Diabetic neuropathy ([place])   . GERD (gastroesophageal reflux disease)   . Heart attack ([place])   . HTN (hypertension)   . IDDM (insulin dependent diabetes mellitus) ([place])   . Ischemic cardiomyopathy    a. echo 06/04/14 EF 40-45%, HK of entire inferolateral and inferior myocardium c.w infarct of RCA/LCx, GR2DD, mild MR  . OA (osteoarthritis) of knee   . Obesity   . Osteoarthritis   . Osteomyelitis ([place])   . Peripheral neuropathy   . Peripheral vascular disease ([place])   . Tobacco abuse     Patient Active Problem List   Diagnosis Date Noted  . Claudication in peripheral vascular disease ([place]) 10/18/2016  . Hyperlipidemia LDL goal <70 10/18/2016  . Onychomycosis 10/10/2016  . Medication monitoring encounter 10/10/2016  . Amputated toe, left ([place]) 10/10/2016  . Amputated toe, right ([place]) 10/10/2016  . Osteomyelitis of left foot ([place]) 10/21/2015  . Hyperglycemia 10/02/2015  . IDDM (insulin dependent diabetes mellitus) ([place])   . Peripheral neuropathy   . Tobacco abuse   . Obesity    . Ischemic cardiomyopathy   . 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 Coronary artery disease involving native coronary artery of native heart without angina pectoris   . HTN (hypertension)   . DM2 (diabetes mellitus, type 2) ([place])   . GERD (gastroesophageal reflux disease)   . NSTEMI (non-ST elevated myocardial infarction) ([place]) 06/03/2014    Past Surgical History:  Procedure Laterality Date  . ABDOMINAL AORTOGRAM W/LOWER EXTREMITY N/A 10/27/2016   Procedure: Abdominal Aortogram w/Lower Extremity;  Surgeon: Nelva Bush, MD;  Location: [place] [facility];  Service: Cardiovascular;  Laterality: N/A;  . AMPUTATION TOE Left 10/21/2015   Procedure: AMPUTATION TOE;  Surgeon: Sharlotte Alamo, DPM;  Location: [facility] OR[facility];  Service: Podiatry;  Laterality: Left;  . AORTIC VALVE REPLACEMENT (AVR)/CORONARY ARTERY BYPASS GRAFTING (CABG)    . BREAST BIOPSY    . CARDIAC CATHETERIZATION  06/2014   95% stenosis mLAD, occlusion ostial OM1, 70% stenosis LCx, 95% stenosis mRCA, EF 45%.  . CORONARY ARTERY BYPASS GRAFT N/A 06/08/2014   Procedure: CORONARY ARTERY BYPASS GRAFTING (CABG);  Surgeon: Grace Isaac, MD;  Location: [place];  Service: Open Heart Surgery;  Laterality: N/A;  Times 4 using left internal mammary artery to LAD artery and endoscopically harvested bilateral saphenous vein to Obtuse Marginal, Diagonal and Posterior Descending coronary arteries.  . CT ABD W & PELVIS WO CM  06/2014   nl liver, gallbladder, spleen, mild diverticular changes, no bowel wall inflammation, appendix  nl, no hernia, no other sig abnormalities  . TEE WITHOUT CARDIOVERSION N/A 06/08/2014   Procedure: TRANSESOPHAGEAL ECHOCARDIOGRAM (TEE);  Surgeon: Grace Isaac, MD;  Location: [place];  Service: Open Heart Surgery;  Laterality: N/A;  . TOE AMPUTATION Right 12/2011   rt middle toe  . US ECHOCARDIOGRAPHY  06/2014   EF 50-55%, HK of inf/post/inferolat walls, Ao sclerosis    Prior to Admission medications   Medication Sig Start Date End  Date Taking? Authorizing Provider  aspirin EC 81 MG EC tablet Take 1 tablet (81 mg total) by mouth daily. 10/04/15   Hower, Aaron Mose, MD  atorvastatin (LIPITOR) 80 MG tablet Take 1 tablet (80 mg total) by mouth daily at 6 PM. 10/20/16   Gollan, Kathlene November, MD  clopidogrel (PLAVIX) 75 MG tablet Take 1 tablet (75 mg total) by mouth daily. 10/20/16   Minna Merritts, MD  DULoxetine HCl (CYMBALTA PO) Take by mouth 2 (two) times daily.    [provider]  furosemide (LASIX) 20 MG tablet Take 1 tablet (20 mg total) by mouth daily as needed. 10/20/16 10/20/17  Minna Merritts, MD  insulin glargine (LANTUS) 100 UNIT/ML injection Inject 0.6 mLs (60 Units total) into the skin 2 (two) times daily. 10/10/16   Lada, Satira Anis, MD  Insulin Syringe-Needle U-100 (B-D INS SYR ULTRAFINE 1CC/30G) 30G X 1/2" 1 ML MISC Inject sixty units of long-acting insulin twice a day 10/10/16   Arnetha Courser, MD  lisinopril (PRINIVIL,ZESTRIL) 40 MG tablet Take 1 tablet (40 mg total) by mouth daily. 10/20/16   Minna Merritts, MD  metoprolol tartrate (LOPRESSOR) 25 MG tablet Take 1 tablet (25 mg total) by mouth 2 (two) times daily. 10/20/16   Minna Merritts, MD  oxybutynin (DITROPAN-XL) 5 MG 24 hr tablet Take 1 tablet (5 mg total) by mouth 2 (two) times daily. 10/10/16   Lada, Satira Anis, MD  potassium chloride SA (K-DUR,KLOR-CON) 20 MEQ tablet Take 1 tablet (20 mEq total) by mouth daily as needed. 10/20/16 10/20/17  Minna Merritts, MD    No Known Allergies  Family History  Problem Relation Age of Onset  . CAD Mother   . Hypertension Mother   . Diabetes Mother   . CAD Father   . Diabetes Father   . Sickle cell trait Other   . Asthma Other     Social History Social History  Substance Use Topics  . Smoking status: Current Every Day Smoker    Packs/day: 0.25    Years: 40.00    Types: Cigarettes  . Smokeless tobacco: Never Used     Comment: 4-5 a day  . Alcohol use No    Review of Systems Constitutional:  Negative for fever. Cardiovascular: Negative for increased chest pain. Respiratory: Negative for increased shortness of breath. Gastrointestinal: Negative for abdominal pain, vomiting Musculoskeletal: darkening of right fifth toe Skin: darkening of right fifth toe with ulceration to the lateral aspect. All other ROS negative  ____________________________________________   PHYSICAL EXAM:  VITAL SIGNS: ED Triage Vitals  Enc Vitals Group     BP 02/05/17 0903 (!) 126/94     Pulse Rate 02/05/17 0903 97     Resp 02/05/17 0903 20     Temp --      Temp Source 02/05/17 0903 Oral     SpO2 02/05/17 0903 100 %     Weight 02/05/17 0904 250 lb (113.4 kg)     Height --  Head Circumference --      Peak Flow --      Pain Score --      Pain Loc --      Pain Edu? --      Excl. in [place]? --     Constitutional: Alert and oriented. Well appearing and in no distress. Eyes: Normal exam ENT   Head: Normocephalic and atraumatic.   Mouth/Throat: Mucous membranes are moist. Cardiovascular: Normal rate, regular rhythm. No murmur Respiratory: Normal respiratory effort without tachypnea nor retractions. Breath sounds are clear Gastrointestinal: Soft and nontender. No distention.  Musculoskeletal: Nontender with normal range of motion in all extremities. right fifth toe darkening with chronic appearing ulceration to the lateral aspect. Neurologic:  Normal speech and language. No gross focal neurologic deficits Skin:  Skin is warm, dry, darkening with ulcerations right fifth toe as described above Psychiatric: Mood and affect are normal.  ____________________________________________   RADIOLOGY  x-ray shows no signs of osteomyelitis. Possible foreign body.  ____________________________________________   INITIAL IMPRESSION / ASSESSMENT AND PLAN / ED COURSE  Pertinent labs & imaging results that were available during my care of the patient were reviewed by me and considered in my medical  decision making (see chart for details).  patient presents to the emergency department for right fifth toe darkening times one week. Patient denies any fever, vomiting, pain. History of 2 prior toe amputations in the past due to severe dementia. We will obtain an x-ray to further evaluate. Darkening appears chronic. Ulceration appears chronic. Differential this time would include osteomyelitis, chronic ulceration, vascular deficiency. If x-ray is normal we will likely discharge with podiatry follow-up. Overall the patient appears very well with normal vitals, and an otherwise normal exam.  x-ray shows no signs of ST myelitis, possible foreign body. We'll place on antibiotics and have the patient follow-up with podiatry. On my review the x-ray the foreign body does appear to be consistent with a needle/metallic foreign body. Nothing visible from the skin surface. We will have the patient follow-up with podiatry.  ____________________________________________   FINAL CLINICAL IMPRESSION(S) / ED DIAGNOSES  diabetic neuropathy Diabetic foot ulcer foreign body   Harvest Dark, MD 02/05/17 1015

## 2017-02-27 ENCOUNTER — Ambulatory Visit: Payer: Medicare Other | Admitting: Internal Medicine

## 2017-05-01 ENCOUNTER — Encounter (HOSPITAL_COMMUNITY): Payer: Self-pay | Admitting: Internal Medicine

## 2017-05-01 NOTE — Progress Notes (Deleted)
Follow-up Outpatient Visit Date: 05/02/2017  Primary Care Provider: Donnie Coffin, MD [place] [address]  Chief Complaint: ***  HPI:  [name] is a 62 y.o. year-old female with history of coronary artery disease status post 4 vessel CABG in 06/2014, peripheral vascular disease, hypertension, tobacco abuse, diabetes, obesity, and peripheral neuropathy, who presents for follow-up of claudication. I last saw her in July, at which time she continued to complain of significant numbness and cramping in both legs. We discussed referral to a supervised walking program, which has not been able to take place. I also encouraged her to quit smoking as well. Given the complexity of her anatomy, we agreed to defer surgical referral and percutaneous intervention. She presented to the [facility] ED in 01/2017 with discoloration of the right 5th toe. Retained foreign body was question; Melinda [name] was started on antibiotics and referred to podiatry.Melinda [name]  --------------------------------------------------------------------------------------------------  Cardiovascular History & Procedures: Cardiovascular Problems:  Coronary artery disease status post NSTEMI and CABG (2016)  Peripheral vascular disease with claudication  Ischemic cardiomyopathy  Risk Factors:  Known CAD, PVD, hypertension, hyperlipidemia, diabetes mellitus, sedentary lifestyle, obesity, and tobacco use  Cath/PCI:  Abdominal aortogram and bilateral lower extremity runoff (10/27/16): Mild plaquing of the infrarenal aorta. Mild bilateral inflow disease. Right SFA with 40-50% distal stenosis. P2 segment of popliteal artery is occluded with reconstitution of the ATA, PTA, and peroneal arteries via collaterals. There is chronic total occlusion of the left SFA proximally with reconstitution of the distal vessel above the abductor canal. Three-vessel runoff on the left.  LHC (06/2014): 3 vessel disease; further details not  available  CV Surgery:  CABG (06/09/14, Dr. Servando Snare): LIMA to LAD, SVG to diagonal, SVG to OM, and SVG to PDA.  EP Procedures and Devices:  None  Non-Invasive Evaluation(s):  Bilateral lower extremity arterial Doppler (10/11/16): Technically challenging aorto-iliac portion. >50% right common iliac artery stenosis. Diffuse heterogeneous plaque, bilaterally. 50-74% right mid popliteal artery stenosis. Occluded left proximal SFA. Two-vessel run-off seen, bilaterally.  Bilateral ABI (10/11/16): Right colon 0.50, left 0.60.  TTE (06/04/14): Normal LV size with moderate LVH. LVEF 40-45% with severe hypokinesis of the inferolateral and inferior myocardium. Grade 2 diastolic dysfunction. Mild MR. Normal RV size and function.  Recent CV Pertinent Labs: Lab Results  Component Value Date   CHOL 214 (H) 10/10/2016   CHOL 94 06/02/2014   HDL 42 10/10/2016   HDL 39 (L) 06/02/2014   LDLCALC 103 (H) 10/10/2016   LDLCALC 31 06/02/2014   TRIG 346 (H) 10/10/2016   TRIG 122 06/02/2014   CHOLHDL 5.1 (H) 10/10/2016   INR 1.05 10/20/2016   INR 1.1 06/03/2014   K 3.9 10/20/2016   K 4.1 06/03/2014   MG 2.3 06/09/2014   BUN 24 (H) 10/20/2016   BUN 16 10/10/2016   BUN 13 06/03/2014   CREATININE 1.41 (H) 10/20/2016   CREATININE 1.13 06/03/2014    Past medical and surgical history were reviewed and updated in EPIC.  No outpatient medications have been marked as taking for the 05/02/17 encounter (Appointment) with Vernadine Coombs, Harrell Gave, MD.    Allergies: Patient has no known allergies.  Social History   Socioeconomic History  . Marital status: Widowed    Spouse name: Not on file  . Number of children: Not on file  . Years of education: Not on file  . Highest education level: Not on file  Social Needs  . Financial resource strain: Not on file  .  Food insecurity - worry: Not on file  . Food insecurity - inability: Not on file  . Transportation needs - medical: Not on file  . Transportation needs  - non-medical: Not on file  Occupational History  . Not on file  Tobacco Use  . Smoking status: Current Every Day Smoker    Packs/day: 0.25    Years: 40.00    Pack years: 10.00    Types: Cigarettes  . Smokeless tobacco: Never Used  . Tobacco comment: 4-5 a day  Substance and Sexual Activity  . Alcohol use: No    Alcohol/week: 0.0 oz  . Drug use: No  . Sexual activity: Not on file  Other Topics Concern  . Not on file  Social History Narrative  . Not on file    Family History  Problem Relation Age of Onset  . CAD Mother   . Hypertension Mother   . Diabetes Mother   . CAD Father   . Diabetes Father   . Sickle cell trait Other   . Asthma Other     Review of Systems: A 12-system review of systems was performed and was negative except as noted in the HPI.  --------------------------------------------------------------------------------------------------  Physical Exam: There were no vitals taken for this visit.  General:  *** HEENT: No conjunctival pallor or scleral icterus. Moist mucous membranes.  OP clear. Neck: Supple without lymphadenopathy, thyromegaly, JVD, or HJR. No carotid bruit. Lungs: Normal work of breathing. Clear to auscultation bilaterally without wheezes or crackles. Heart: Regular rate and rhythm without murmurs, rubs, or gallops. Non-displaced PMI. Abd: Bowel sounds present. Soft, NT/ND without hepatosplenomegaly Ext: No lower extremity edema. Radial, PT, and DP pulses are 2+ bilaterally. Skin: Warm and dry without rash.  EKG:  ***  Lab Results  Component Value Date   WBC 7.5 10/20/2016   HGB 12.7 10/20/2016   HCT 39.1 10/20/2016   MCV 77.1 (L) 10/20/2016   PLT 273 10/20/2016    Lab Results  Component Value Date   NA 143 10/20/2016   K 3.9 10/20/2016   CL 107 10/20/2016   CO2 28 10/20/2016   BUN 24 (H) 10/20/2016   CREATININE 1.41 (H) 10/20/2016   GLUCOSE 213 (H) 10/20/2016   ALT 13 10/10/2016    Lab Results  Component Value Date     CHOL 214 (H) 10/10/2016   HDL 42 10/10/2016   LDLCALC 103 (H) 10/10/2016   TRIG 346 (H) 10/10/2016   CHOLHDL 5.1 (H) 10/10/2016    --------------------------------------------------------------------------------------------------  ASSESSMENT AND PLAN: Nelva Bush, MD 05/01/2017 8:18 PM

## 2017-05-02 ENCOUNTER — Ambulatory Visit: Payer: Medicare Other | Admitting: Internal Medicine

## 2017-05-03 ENCOUNTER — Encounter: Payer: Self-pay | Admitting: Internal Medicine

## 2017-05-05 DIAGNOSIS — I2510 Atherosclerotic heart disease of native coronary artery without angina pectoris: Secondary | ICD-10-CM | POA: Insufficient documentation

## 2017-05-05 NOTE — Progress Notes (Deleted)
Cardiology Office Note  Date:  05/05/2017   ID:  Melinda [name], DOB 1955/05/28, MRN [id number]  PCP:  Donnie Coffin, MD   No chief complaint on file.   HPI:  62 y.o. female with h/o CAD s/p 4 vessel CABG on 06/08/2014  poorly controlled IDDM,  HTN,  ongoing tobacco abuse,  obesity,  anxiety,  peripheral neuropathy  admitted to [facility] from 2/1-06/03/14 for NSTEMI.  After undergoing cardiac cath on 2/3 that showed severe 3 vessel disease she was transferred to [facility] from 2/3-2/13 for . 4 vessel CABG (LIMA to LAD, SVG to Diagonal, SVG to OM, and SVG to PDA) She presents today for follow-up in the clinic for her coronary artery disease  In follow-up today she reports that she was out of her medications for 2 months Lost her Medicaid Scheduled to go on Medicare in one week or so Difficulty affording the insulin Having chronic left low back pain, worse on movement  Scheduled to have lower extremity arterial study with Dr. Saunders Revel in [facility] June 29 She's having difficulty obtaining a ride early in the morning for pre-hydration   hemoglobin A1c typically greater than 12 Long discussion concerning smoking cessation Did well on  Chantix, unable to afford it anymore , smoking again  Denies any significant chest pain  EKG on today's visit shows normal sinus rhythm with rate 73 bpm, ST and T wave abnormality  unchanged from previous EKG   other past medical history   presented to [facility] ED on 2/1 with headache, neck pain, nasal pain, chest tightness, nausea, and just overall did not feel well. Work up in the [facility] ED confirmed sinus infection, which she started on Augmentin for. She was also found to be hypotensive with BP 85/51. She received IV fluids with improvement in her BP to 120s/80s. Negative abdominal CT. She did have an elevated troponin at <0.02->1.30-->3.60-->7.80-->13.26-->23.00-->30.00. She was placed on a heparin gtt. Cardiac cath showed 95% stenosis mLAD, occlusion ostial OM1, 70%  stenosis LCx, 95% stenosis mRCA, EF 45%. She underwent successful cabg on 06/08/2014 at [facility]. She tolerated this procedure well. Echo at [facility] showed EF 40-45%, HK of entire inferior and inferolateral wall c/w infarction of RCA or LCx, GR2DD, and mild TR. She was discharged on Lopressor 12.5 mg bid with labile BP. She was not started on an ACEi 2/2 labile BP. Hgb was stable at discharge. She was continued on Lasix 40 mg daily. She was advised to follow up with her PCP for her blood sugars (pre op A1C 10.5%).    PMH:   has a past medical history of Anxiety, Coronary artery disease, Diabetic neuropathy ([place]), GERD (gastroesophageal reflux disease), Heart attack ([place]), HTN (hypertension), IDDM (insulin dependent diabetes mellitus) ([place]), Ischemic cardiomyopathy, OA (osteoarthritis) of knee, Obesity, Osteoarthritis, Osteomyelitis ([place]), Peripheral neuropathy, Peripheral vascular disease ([place]), and Tobacco abuse.  PSH:    Past Surgical History:  Procedure Laterality Date  . ABDOMINAL AORTOGRAM W/LOWER EXTREMITY N/A 10/27/2016   Procedure: Abdominal Aortogram w/Lower Extremity;  Surgeon: Nelva Bush, MD;  Location: [place] CV LAB;  Service: Cardiovascular;  Laterality: N/A;  . AMPUTATION TOE Left 10/21/2015   Procedure: AMPUTATION TOE;  Surgeon: Sharlotte Alamo, DPM;  Location: [facility] ORS;  Service: Podiatry;  Laterality: Left;  . AORTIC VALVE REPLACEMENT (AVR)/CORONARY ARTERY BYPASS GRAFTING (CABG)    . BREAST BIOPSY    . CARDIAC CATHETERIZATION  06/2014   95% stenosis mLAD, occlusion ostial OM1, 70% stenosis LCx, 95% stenosis mRCA, EF 45%.  Marland Kitchen  CORONARY ARTERY BYPASS GRAFT N/A 06/08/2014   Procedure: CORONARY ARTERY BYPASS GRAFTING (CABG);  Surgeon: Grace Isaac, MD;  Location: [place];  Service: Open Heart Surgery;  Laterality: N/A;  Times 4 using left internal mammary artery to LAD artery and endoscopically harvested bilateral saphenous vein to Obtuse Marginal, Diagonal and Posterior Descending coronary  arteries.  . CT ABD W & PELVIS WO CM  06/2014   nl liver, gallbladder, spleen, mild diverticular changes, no bowel wall inflammation, appendix nl, no hernia, no other sig abnormalities  . TEE WITHOUT CARDIOVERSION N/A 06/08/2014   Procedure: TRANSESOPHAGEAL ECHOCARDIOGRAM (TEE);  Surgeon: Grace Isaac, MD;  Location: [place];  Service: Open Heart Surgery;  Laterality: N/A;  . TOE AMPUTATION Right 12/2011   rt middle toe  . US ECHOCARDIOGRAPHY  06/2014   EF 50-55%, HK of inf/post/inferolat walls, Ao sclerosis    Current Outpatient Medications  Medication Sig Dispense Refill  . aspirin EC 81 MG EC tablet Take 1 tablet (81 mg total) by mouth daily. 30 tablet 0  . atorvastatin (LIPITOR) 80 MG tablet Take 1 tablet (80 mg total) by mouth daily at 6 PM. 30 tablet 11  . cephALEXin (KEFLEX) 500 MG capsule Take 1 capsule (500 mg total) by mouth 2 (two) times daily. 20 capsule 0  . clopidogrel (PLAVIX) 75 MG tablet Take 1 tablet (75 mg total) by mouth daily. 30 tablet 11  . DULoxetine HCl (CYMBALTA PO) Take by mouth 2 (two) times daily.    . furosemide (LASIX) 20 MG tablet Take 1 tablet (20 mg total) by mouth daily as needed. 30 tablet 11  . insulin glargine (LANTUS) 100 UNIT/ML injection Inject 0.6 mLs (60 Units total) into the skin 2 (two) times daily. 4 vial 1  . Insulin Syringe-Needle U-100 (B-D INS SYR ULTRAFINE 1CC/30G) 30G X 1/2" 1 ML MISC Inject sixty units of long-acting insulin twice a day 100 each 1  . lisinopril (PRINIVIL,ZESTRIL) 40 MG tablet Take 1 tablet (40 mg total) by mouth daily. 30 tablet 11  . metoprolol tartrate (LOPRESSOR) 25 MG tablet Take 1 tablet (25 mg total) by mouth 2 (two) times daily. 60 tablet 11  . oxybutynin (DITROPAN-XL) 5 MG 24 hr tablet Take 1 tablet (5 mg total) by mouth 2 (two) times daily. 60 tablet 1  . potassium chloride SA (K-DUR,KLOR-CON) 20 MEQ tablet Take 1 tablet (20 mEq total) by mouth daily as needed. 30 tablet 11   No current facility-administered  medications for this visit.      Allergies:   Patient has no known allergies.   Social History:  The patient  reports that she has been smoking cigarettes.  She has a 10.00 pack-year smoking history. she has never used smokeless tobacco. She reports that she does not drink alcohol or use drugs.   Family History:   family history includes Asthma in her other; CAD in her father and mother; Diabetes in her father and mother; Hypertension in her mother; Sickle cell trait in her other.    Review of Systems: Review of Systems  Constitutional: Negative.   Respiratory: Negative.   Cardiovascular: Negative.   Gastrointestinal: Negative.   Musculoskeletal: Positive for back pain.       Claudication pain in her legs  Neurological: Negative.   Psychiatric/Behavioral: Negative.   All other systems reviewed and are negative.    PHYSICAL EXAM: VS:  There were no vitals taken for this visit. , BMI There is no height or weight on  file to calculate BMI. GEN: Well nourished, well developed, in no acute distress  HEENT: normal  Neck: no JVD, carotid bruits, or masses Cardiac: RRR; no murmurs, rubs, or gallops,no edema  Respiratory:  clear to auscultation bilaterally, normal work of breathing GI: soft, nontender, nondistended, + BS MS: no deformity or atrophy  Skin: warm and dry, no rash Neuro:  Strength and sensation are intact Psych: euthymic mood, full affect    Recent Labs: 10/10/2016: ALT 13 10/20/2016: BUN 24; Creatinine, Ser 1.41; Hemoglobin 12.7; Platelets 273; Potassium 3.9; Sodium 143    Lipid Panel Lab Results  Component Value Date   CHOL 214 (H) 10/10/2016   HDL 42 10/10/2016   LDLCALC 103 (H) 10/10/2016   TRIG 346 (H) 10/10/2016      Wt Readings from Last 3 Encounters:  02/05/17 250 lb (113.4 kg)  11/22/16 250 lb 8 oz (113.6 kg)  10/27/16 260 lb (117.9 kg)       ASSESSMENT AND PLAN:  Coronary artery disease involving native coronary artery of native heart  without angina pectoris -  Currently with no symptoms of angina. No further workup at this time. Continue current medication regimen. Recommended she stay on aspirin and Plavix  Essential hypertension -  Blood pressure is well controlled on today's visit. No changes made to the medications. She is only recently back on her medications  Ischemic cardiomyopathy -  Medications restarted as listed  Claudication [facility]) -  scheduled for angiogram with Dr. Saunders Revel next week  S/P CABG x 4 Currently with no symptoms of angina. No further workup at this time. Continue current medication regimen.  Type 2 diabetes mellitus with other circulatory complication, with long-term current use of insulin ([place]) We have encouraged continued exercise, careful diet management in an effort to lose weight.  significant dietary indiscretion  Tobacco abuse We have encouraged her to continue to work on weaning her cigarettes and smoking cessation. She will continue to work on this and does not want any assistance with chantix.    Total encounter time more than 25 minutes  Greater than 50% was spent in counseling and coordination of care with the patient    Disposition:   F/U  6 months   No orders of the defined types were placed in this encounter.    Signed, Esmond Plants, M.D., Ph.D. 05/05/2017  [place], [place]

## 2017-05-07 ENCOUNTER — Ambulatory Visit: Payer: Medicare Other | Admitting: Cardiovascular Disease

## 2017-05-11 ENCOUNTER — Telehealth: Payer: Self-pay | Admitting: Cardiovascular Disease

## 2017-05-11 DIAGNOSIS — E1159 Type 2 diabetes mellitus with other circulatory complications: Secondary | ICD-10-CM

## 2017-05-11 DIAGNOSIS — Z794 Long term (current) use of insulin: Principal | ICD-10-CM

## 2017-05-11 DIAGNOSIS — I2510 Atherosclerotic heart disease of native coronary artery without angina pectoris: Secondary | ICD-10-CM

## 2017-05-11 MED ORDER — CLOPIDOGREL BISULFATE 75 MG PO TABS: 75.0000 mg | ORAL_TABLET | Freq: Every day | ORAL | 3 refills | Status: DC

## 2017-05-11 MED ORDER — METOPROLOL TARTRATE 25 MG PO TABS: 25.0000 mg | ORAL_TABLET | Freq: Two times a day (BID) | ORAL | 3 refills | Status: DC

## 2017-05-11 MED ORDER — POTASSIUM CHLORIDE CRYS ER 20 MEQ PO TBCR: 20.0000 meq | EXTENDED_RELEASE_TABLET | Freq: Every day | ORAL | 3 refills | Status: DC | PRN

## 2017-05-11 MED ORDER — ATORVASTATIN CALCIUM 80 MG PO TABS: 80.0000 mg | ORAL_TABLET | Freq: Every day | ORAL | 3 refills | Status: DC

## 2017-05-11 MED ORDER — LISINOPRIL 40 MG PO TABS: 40.0000 mg | ORAL_TABLET | Freq: Every day | ORAL | 3 refills | Status: DC

## 2017-05-11 MED ORDER — FUROSEMIDE 20 MG PO TABS: 20.0000 mg | ORAL_TABLET | Freq: Every day | ORAL | 3 refills | Status: DC | PRN

## 2017-05-11 NOTE — Telephone Encounter (Signed)
Pt calling stating she needs her prescriptions sent to [facility] mail order  (she states it is a lot and doesn't know all of them)  If we can just send it to them   Please advise

## 2017-05-11 NOTE — Telephone Encounter (Signed)
Rx sent to [facility] mail order for all cardiac medications per the patient's request.

## 2017-05-14 ENCOUNTER — Inpatient Hospital Stay
Admission: AD | Admit: 2017-05-14 | Discharge: 2017-05-17 | DRG: 617 | Disposition: A | Discharge status: Home / Self Care | Payer: Medicare HMO | Source: Ambulatory Visit | Attending: Internal Medicine | Admitting: Internal Medicine

## 2017-05-14 DIAGNOSIS — Z23 Encounter for immunization: Secondary | ICD-10-CM | POA: Diagnosis present

## 2017-05-14 DIAGNOSIS — E669 Obesity, unspecified: Secondary | ICD-10-CM | POA: Diagnosis present

## 2017-05-14 DIAGNOSIS — E1165 Type 2 diabetes mellitus with hyperglycemia: Secondary | ICD-10-CM | POA: Diagnosis present

## 2017-05-14 DIAGNOSIS — M869 Osteomyelitis, unspecified: Secondary | ICD-10-CM | POA: Diagnosis present

## 2017-05-14 DIAGNOSIS — L97529 Non-pressure chronic ulcer of other part of left foot with unspecified severity: Secondary | ICD-10-CM | POA: Diagnosis present

## 2017-05-14 DIAGNOSIS — E1169 Type 2 diabetes mellitus with other specified complication: Secondary | ICD-10-CM | POA: Diagnosis present

## 2017-05-14 DIAGNOSIS — E1142 Type 2 diabetes mellitus with diabetic polyneuropathy: Secondary | ICD-10-CM | POA: Diagnosis present

## 2017-05-14 DIAGNOSIS — D1722 Benign lipomatous neoplasm of skin and subcutaneous tissue of left arm: Secondary | ICD-10-CM | POA: Diagnosis present

## 2017-05-14 DIAGNOSIS — E1152 Type 2 diabetes mellitus with diabetic peripheral angiopathy with gangrene: Secondary | ICD-10-CM | POA: Diagnosis present

## 2017-05-14 DIAGNOSIS — I2510 Atherosclerotic heart disease of native coronary artery without angina pectoris: Secondary | ICD-10-CM | POA: Diagnosis present

## 2017-05-14 DIAGNOSIS — I129 Hypertensive chronic kidney disease with stage 1 through stage 4 chronic kidney disease, or unspecified chronic kidney disease: Secondary | ICD-10-CM | POA: Diagnosis present

## 2017-05-14 DIAGNOSIS — E1122 Type 2 diabetes mellitus with diabetic chronic kidney disease: Secondary | ICD-10-CM | POA: Diagnosis present

## 2017-05-14 DIAGNOSIS — E785 Hyperlipidemia, unspecified: Secondary | ICD-10-CM | POA: Diagnosis present

## 2017-05-14 DIAGNOSIS — E11621 Type 2 diabetes mellitus with foot ulcer: Secondary | ICD-10-CM | POA: Diagnosis present

## 2017-05-14 DIAGNOSIS — Z794 Long term (current) use of insulin: Secondary | ICD-10-CM

## 2017-05-14 DIAGNOSIS — I739 Peripheral vascular disease, unspecified: Secondary | ICD-10-CM | POA: Diagnosis present

## 2017-05-14 DIAGNOSIS — N183 Chronic kidney disease, stage 3 (moderate): Secondary | ICD-10-CM | POA: Diagnosis present

## 2017-05-14 DIAGNOSIS — F17210 Nicotine dependence, cigarettes, uncomplicated: Secondary | ICD-10-CM | POA: Diagnosis present

## 2017-05-14 DIAGNOSIS — I10 Essential (primary) hypertension: Secondary | ICD-10-CM | POA: Diagnosis not present

## 2017-05-14 DIAGNOSIS — E1159 Type 2 diabetes mellitus with other circulatory complications: Secondary | ICD-10-CM

## 2017-05-14 DIAGNOSIS — Z951 Presence of aortocoronary bypass graft: Secondary | ICD-10-CM

## 2017-05-14 DIAGNOSIS — Z952 Presence of prosthetic heart valve: Secondary | ICD-10-CM

## 2017-05-14 DIAGNOSIS — I96 Gangrene, not elsewhere classified: Secondary | ICD-10-CM | POA: Diagnosis present

## 2017-05-14 DIAGNOSIS — K219 Gastro-esophageal reflux disease without esophagitis: Secondary | ICD-10-CM | POA: Diagnosis present

## 2017-05-14 DIAGNOSIS — Z7902 Long term (current) use of antithrombotics/antiplatelets: Secondary | ICD-10-CM

## 2017-05-14 DIAGNOSIS — I255 Ischemic cardiomyopathy: Secondary | ICD-10-CM | POA: Diagnosis present

## 2017-05-14 DIAGNOSIS — E119 Type 2 diabetes mellitus without complications: Secondary | ICD-10-CM | POA: Diagnosis not present

## 2017-05-14 DIAGNOSIS — I252 Old myocardial infarction: Secondary | ICD-10-CM | POA: Diagnosis not present

## 2017-05-14 DIAGNOSIS — F419 Anxiety disorder, unspecified: Secondary | ICD-10-CM | POA: Diagnosis present

## 2017-05-14 DIAGNOSIS — Z7982 Long term (current) use of aspirin: Secondary | ICD-10-CM

## 2017-05-14 DIAGNOSIS — Z6832 Body mass index (BMI) 32.0-32.9, adult: Secondary | ICD-10-CM

## 2017-05-14 DIAGNOSIS — M79605 Pain in left leg: Secondary | ICD-10-CM | POA: Diagnosis not present

## 2017-05-14 LAB — GLUCOSE, CAPILLARY
Glucose-Capillary: 277 mg/dL — ABNORMAL HIGH (ref 65–99)
Glucose-Capillary: 362 mg/dL — ABNORMAL HIGH (ref 65–99)

## 2017-05-14 LAB — CBC WITH DIFFERENTIAL/PLATELET
Basophils Absolute: 0.1 10*3/uL (ref 0–0.1)
Basophils Relative: 1 %
Eosinophils Absolute: 0.5 10*3/uL (ref 0–0.7)
Eosinophils Relative: 4 %
HCT: 37.7 % (ref 35.0–47.0)
Hemoglobin: 12 g/dL (ref 12.0–16.0)
Lymphocytes Relative: 19 %
Lymphs Abs: 2.4 10*3/uL (ref 1.0–3.6)
MCH: 23.9 pg — ABNORMAL LOW (ref 26.0–34.0)
MCHC: 31.7 g/dL — ABNORMAL LOW (ref 32.0–36.0)
MCV: 75.3 fL — ABNORMAL LOW (ref 80.0–100.0)
Monocytes Absolute: 1 10*3/uL — ABNORMAL HIGH (ref 0.2–0.9)
Monocytes Relative: 8 %
Neutro Abs: 8.3 10*3/uL — ABNORMAL HIGH (ref 1.4–6.5)
Neutrophils Relative %: 68 %
Platelets: 324 10*3/uL (ref 150–440)
RBC: 5.01 MIL/uL (ref 3.80–5.20)
RDW: 16.5 % — ABNORMAL HIGH (ref 11.5–14.5)
WBC: 12.2 10*3/uL — ABNORMAL HIGH (ref 3.6–11.0)

## 2017-05-14 LAB — COMPREHENSIVE METABOLIC PANEL
ALT: 13 U/L — ABNORMAL LOW (ref 14–54)
AST: 15 U/L (ref 15–41)
Albumin: 3.2 g/dL — ABNORMAL LOW (ref 3.5–5.0)
Alkaline Phosphatase: 120 U/L (ref 38–126)
Anion gap: 8 (ref 5–15)
BUN: 21 mg/dL — ABNORMAL HIGH (ref 6–20)
CO2: 26 mmol/L (ref 22–32)
Calcium: 9.1 mg/dL (ref 8.9–10.3)
Chloride: 104 mmol/L (ref 101–111)
Creatinine, Ser: 1.45 mg/dL — ABNORMAL HIGH (ref 0.44–1.00)
GFR calc Af Amer: 43 mL/min — ABNORMAL LOW (ref >60)
GFR calc non Af Amer: 37 mL/min — ABNORMAL LOW (ref >60)
Glucose, Bld: 287 mg/dL — ABNORMAL HIGH (ref 65–99)
Potassium: 4.3 mmol/L (ref 3.5–5.1)
Sodium: 138 mmol/L (ref 135–145)
Total Bilirubin: 0.5 mg/dL (ref 0.3–1.2)
Total Protein: 7.3 g/dL (ref 6.5–8.1)

## 2017-05-14 LAB — APTT: aPTT: 32 seconds (ref 24–36)

## 2017-05-14 LAB — MAGNESIUM: Magnesium: 1.7 mg/dL (ref 1.7–2.4)

## 2017-05-14 LAB — PROTIME-INR
INR: 1.03
Prothrombin Time: 13.4 seconds (ref 11.4–15.2)

## 2017-05-14 MED ORDER — PIPERACILLIN-TAZOBACTAM 3.375 G IVPB
3.3750 g | Freq: Three times a day (TID) | INTRAVENOUS | Status: DC
  Administered 2017-05-14 – 2017-05-17 (×8): 3.375 g via INTRAVENOUS
  Filled 2017-05-14 (×8): qty 50

## 2017-05-14 MED ORDER — BISACODYL 5 MG PO TBEC: 5.0000 mg | DELAYED_RELEASE_TABLET | Freq: Every day | ORAL | Status: DC | PRN

## 2017-05-14 MED ORDER — HYDROCODONE-ACETAMINOPHEN 5-325 MG PO TABS
1.0000 | ORAL_TABLET | ORAL | Status: DC | PRN
  Administered 2017-05-14 – 2017-05-16 (×5): 2 via ORAL
  Filled 2017-05-14 (×2): qty 2
  Filled 2017-05-14: qty 1
  Filled 2017-05-14 (×2): qty 2
  Filled 2017-05-14: qty 1

## 2017-05-14 MED ORDER — INSULIN GLARGINE 100 UNIT/ML ~~LOC~~ SOLN
60.0000 [IU] | Freq: Two times a day (BID) | SUBCUTANEOUS | Status: DC
  Filled 2017-05-14 (×2): qty 0.6

## 2017-05-14 MED ORDER — ATORVASTATIN CALCIUM 20 MG PO TABS
80.0000 mg | ORAL_TABLET | Freq: Every day | ORAL | Status: DC
  Administered 2017-05-14 – 2017-05-16 (×3): 80 mg via ORAL
  Filled 2017-05-14 (×3): qty 4

## 2017-05-14 MED ORDER — ASPIRIN EC 81 MG PO TBEC
81.0000 mg | DELAYED_RELEASE_TABLET | Freq: Every day | ORAL | Status: DC
  Administered 2017-05-16: 81 mg via ORAL
  Filled 2017-05-14 (×3): qty 1

## 2017-05-14 MED ORDER — ALBUTEROL SULFATE (2.5 MG/3ML) 0.083% IN NEBU: 2.5000 mg | INHALATION_SOLUTION | RESPIRATORY_TRACT | Status: DC | PRN

## 2017-05-14 MED ORDER — ASPIRIN EC 81 MG PO TBEC
81.0000 mg | DELAYED_RELEASE_TABLET | Freq: Every day | ORAL | Status: DC
  Administered 2017-05-14: 81 mg via ORAL
  Filled 2017-05-14: qty 1

## 2017-05-14 MED ORDER — SODIUM CHLORIDE 0.9 % IV SOLN
INTRAVENOUS | Status: DC
  Administered 2017-05-14 – 2017-05-16 (×4): via INTRAVENOUS

## 2017-05-14 MED ORDER — OXYBUTYNIN CHLORIDE ER 5 MG PO TB24
5.0000 mg | ORAL_TABLET | Freq: Two times a day (BID) | ORAL | Status: DC
  Administered 2017-05-14 – 2017-05-17 (×5): 5 mg via ORAL
  Filled 2017-05-14 (×5): qty 1

## 2017-05-14 MED ORDER — ACETAMINOPHEN 325 MG PO TABS: 650.0000 mg | ORAL_TABLET | Freq: Four times a day (QID) | ORAL | Status: DC | PRN

## 2017-05-14 MED ORDER — ONDANSETRON HCL 4 MG PO TABS: 4.0000 mg | ORAL_TABLET | Freq: Four times a day (QID) | ORAL | Status: DC | PRN

## 2017-05-14 MED ORDER — VANCOMYCIN HCL IN DEXTROSE 1-5 GM/200ML-% IV SOLN
1000.0000 mg | Freq: Once | INTRAVENOUS | Status: AC
  Administered 2017-05-14: 1000 mg via INTRAVENOUS
  Filled 2017-05-14: qty 200

## 2017-05-14 MED ORDER — CLOPIDOGREL BISULFATE 75 MG PO TABS
75.0000 mg | ORAL_TABLET | Freq: Every day | ORAL | Status: DC
  Administered 2017-05-16: 75 mg via ORAL
  Filled 2017-05-14 (×2): qty 1

## 2017-05-14 MED ORDER — LISINOPRIL 20 MG PO TABS
40.0000 mg | ORAL_TABLET | Freq: Every day | ORAL | Status: DC
  Administered 2017-05-16: 40 mg via ORAL
  Filled 2017-05-14 (×2): qty 2

## 2017-05-14 MED ORDER — CLOPIDOGREL BISULFATE 75 MG PO TABS
75.0000 mg | ORAL_TABLET | Freq: Every day | ORAL | Status: DC
  Administered 2017-05-14: 75 mg via ORAL
  Filled 2017-05-14: qty 1

## 2017-05-14 MED ORDER — HEPARIN SODIUM (PORCINE) 5000 UNIT/ML IJ SOLN
5000.0000 [IU] | Freq: Three times a day (TID) | INTRAMUSCULAR | Status: DC
  Administered 2017-05-14: 5000 [IU] via SUBCUTANEOUS
  Filled 2017-05-14: qty 1

## 2017-05-14 MED ORDER — INSULIN GLARGINE 100 UNIT/ML ~~LOC~~ SOLN
30.0000 [IU] | Freq: Once | SUBCUTANEOUS | Status: AC
  Administered 2017-05-14: 30 [IU] via SUBCUTANEOUS
  Filled 2017-05-14: qty 0.3

## 2017-05-14 MED ORDER — INSULIN ASPART 100 UNIT/ML ~~LOC~~ SOLN
0.0000 [IU] | Freq: Three times a day (TID) | SUBCUTANEOUS | Status: DC
  Administered 2017-05-14 – 2017-05-15 (×2): 5 [IU] via SUBCUTANEOUS
  Administered 2017-05-15: 1 [IU] via SUBCUTANEOUS
  Filled 2017-05-14 (×2): qty 1

## 2017-05-14 MED ORDER — SENNOSIDES-DOCUSATE SODIUM 8.6-50 MG PO TABS: 1.0000 | ORAL_TABLET | Freq: Every evening | ORAL | Status: DC | PRN

## 2017-05-14 MED ORDER — ACETAMINOPHEN 650 MG RE SUPP: 650.0000 mg | Freq: Four times a day (QID) | RECTAL | Status: DC | PRN

## 2017-05-14 MED ORDER — ONDANSETRON HCL 4 MG/2ML IJ SOLN: 4.0000 mg | Freq: Four times a day (QID) | INTRAMUSCULAR | Status: DC | PRN

## 2017-05-14 MED ORDER — VANCOMYCIN HCL IN DEXTROSE 1-5 GM/200ML-% IV SOLN
1000.0000 mg | INTRAVENOUS | Status: DC
  Administered 2017-05-15 – 2017-05-17 (×4): 1000 mg via INTRAVENOUS
  Filled 2017-05-14 (×5): qty 200

## 2017-05-14 MED ORDER — METOPROLOL TARTRATE 25 MG PO TABS
25.0000 mg | ORAL_TABLET | Freq: Two times a day (BID) | ORAL | Status: DC
Start: 2017-05-14 — End: 2017-05-17
  Administered 2017-05-14 – 2017-05-16 (×5): 25 mg via ORAL
  Filled 2017-05-14 (×6): qty 1

## 2017-05-14 MED ORDER — INSULIN ASPART 100 UNIT/ML ~~LOC~~ SOLN
0.0000 [IU] | Freq: Every day | SUBCUTANEOUS | Status: DC
  Administered 2017-05-14: 5 [IU] via SUBCUTANEOUS
  Administered 2017-05-15: 3 [IU] via SUBCUTANEOUS
  Filled 2017-05-14 (×2): qty 1

## 2017-05-14 NOTE — Consult Note (Signed)
Reason for Consult: Gangrenous changes with osteomyelitis left great toe Referring Physician: Mackenze Grandison is an 62 y.o. female.  HPI: This is a 62 year old female with diabetes and associated neuropathy and history of peripheral vascular disease with recent development of a draining ulceration on her left great toe.  Was seen outpatient with gangrenous changes and radiographic osteomyelitis and admitted for amputation of the great toe and vascular workup.  Past Medical History:  Diagnosis Date  . Anxiety   . Coronary artery disease    a. NSTEMI 06/2014; b. 4 vessel CABG 06/08/2014 LIMA to LAD, SVG to Diagonal, SVG to OM, & SVG to PDA  . Diabetic neuropathy ([place])   . GERD (gastroesophageal reflux disease)   . Heart attack ([place])   . HTN (hypertension)   . IDDM (insulin dependent diabetes mellitus) ([place])   . Ischemic cardiomyopathy    a. echo 06/04/14 EF 40-45%, HK of entire inferolateral and inferior myocardium c.w infarct of RCA/LCx, GR2DD, mild MR  . OA (osteoarthritis) of knee   . Obesity   . Osteoarthritis   . Osteomyelitis ([place])   . Peripheral neuropathy   . Peripheral vascular disease ([place])   . Tobacco abuse     Past Surgical History:  Procedure Laterality Date  . ABDOMINAL AORTOGRAM W/LOWER EXTREMITY N/A 10/27/2016   Procedure: Abdominal Aortogram w/Lower Extremity;  Surgeon: Nelva Bush, MD;  Location: [place] CV LAB;  Service: Cardiovascular;  Laterality: N/A;  . AMPUTATION TOE Left 10/21/2015   Procedure: AMPUTATION TOE;  Surgeon: Sharlotte Alamo, DPM;  Location: [facility] ORS;  Service: Podiatry;  Laterality: Left;  . AORTIC VALVE REPLACEMENT (AVR)/CORONARY ARTERY BYPASS GRAFTING (CABG)    . BREAST BIOPSY    . CARDIAC CATHETERIZATION  06/2014   95% stenosis mLAD, occlusion ostial OM1, 70% stenosis LCx, 95% stenosis mRCA, EF 45%.  . CORONARY ARTERY BYPASS GRAFT N/A 06/08/2014   Procedure: CORONARY ARTERY BYPASS GRAFTING (CABG);  Surgeon: Grace Isaac, MD;  Location:  [place];  Service: Open Heart Surgery;  Laterality: N/A;  Times 4 using left internal mammary artery to LAD artery and endoscopically harvested bilateral saphenous vein to Obtuse Marginal, Diagonal and Posterior Descending coronary arteries.  . CT ABD W & PELVIS WO CM  06/2014   nl liver, gallbladder, spleen, mild diverticular changes, no bowel wall inflammation, appendix nl, no hernia, no other sig abnormalities  . TEE WITHOUT CARDIOVERSION N/A 06/08/2014   Procedure: TRANSESOPHAGEAL ECHOCARDIOGRAM (TEE);  Surgeon: Grace Isaac, MD;  Location: [place];  Service: Open Heart Surgery;  Laterality: N/A;  . TOE AMPUTATION Right 12/2011   rt middle toe  . US ECHOCARDIOGRAPHY  06/2014   EF 50-55%, HK of inf/post/inferolat walls, Ao sclerosis    Family History  Problem Relation Age of Onset  . CAD Mother   . Hypertension Mother   . Diabetes Mother   . CAD Father   . Diabetes Father   . Sickle cell trait Other   . Asthma Other     Social History:  reports that she has been smoking cigarettes.  She has a 10.00 pack-year smoking history. she has never used smokeless tobacco. She reports that she does not drink alcohol or use drugs.  Allergies: No Known Allergies  Medications:  Scheduled: . aspirin EC  81 mg Oral Daily  . atorvastatin  80 mg Oral q1800  . clopidogrel  75 mg Oral Daily  . heparin  5,000 Units Subcutaneous Q8H  . insulin aspart  0-5 Units Subcutaneous QHS  . [START ON 05/15/2017] insulin aspart  0-9 Units Subcutaneous TID WC  . insulin glargine  30 Units Subcutaneous Once  . [START ON 05/15/2017] insulin glargine  60 Units Subcutaneous BID  . [START ON 05/15/2017] lisinopril  40 mg Oral Daily  . metoprolol tartrate  25 mg Oral BID  . oxybutynin  5 mg Oral BID    Results for orders placed or performed during the hospital encounter of 05/14/17 (from the past 48 hour(s))  Glucose, capillary     Status: Abnormal   Collection Time: 05/14/17  5:12 PM  Result Value Ref Range    Glucose-Capillary 277 (H) 65 - 99 mg/dL   Comment 1 Notify RN     No results found.  Review of Systems  Constitutional: Negative for chills and fever.  HENT: Negative.   Eyes: Negative.   Respiratory: Negative.   Cardiovascular: Negative.   Gastrointestinal: Negative for nausea and vomiting.  Genitourinary: Negative.   Musculoskeletal:       Patient relates some pain in her left leg  Skin:       Patient relates a draining sore from her left great toe for the past week  Neurological:       Some numbness and paresthesias related to her diabetes  Endo/Heme/Allergies: Negative.   Psychiatric/Behavioral: Negative.    Blood pressure 125/60, pulse (!) 104, temperature 98.3 F (36.8 C), temperature source Oral, SpO2 100 %. Physical Exam  Cardiovascular:  DP and PT pulses are absent on the left.  Trace and thready on the right.  Musculoskeletal:  Previous amputation of the third toe bilateral.  Neurological:  Loss of protective threshold with monofilament wire distally in the forefoot and digits.  Skin:  The skin is warm dry and mildly atrophic.  Significant edema in the left foot and leg as compared to the right.  Some erythema and discoloration in the left great toe with distal gangrenous and necrotic changes at the distal and lateral hallux.  Full-thickness underlying ulceration down through the deeper subcutaneous tissues.    Assessment/Plan: Assessment: 1.  Gangrene with osteomyelitis left great toe. 2.  Diabetes with associated neuropathy. 3.  Peripheral vascular disease.  Plan: Discussed with the patient the need for amputation of the left great toe.  I did discuss her case with Dr. Lucky Cowboy earlier.  Recommended proceeding with the amputation and then he will try to plan for vascular intervention on Wednesday or Thursday.  Discussed with the patient possible risks and complications of the amputation including inability to heal due to infection as well as her diabetes or peripheral  vascular disease.  No guarantees could be given as to the outcome.  We will obtain consent for amputation of the left great toe.  N.p.o. after midnight.  Plan for surgery early tomorrow afternoon  [name] 05/14/2017, 6:37 PM

## 2017-05-14 NOTE — Progress Notes (Signed)
Pharmacy Antibiotic Note  Melinda [name] is a 62 y.o. female admitted on 05/14/2017 with osteomyelitis.  Pharmacy has been consulted for vancomycin and Zosyn dosing.  Plan: Ke= 0.044 h-1 Vd= 53.9 L  Vancomycin 1000 mg iv once then 1000 mg iv q 18 hours. Will adjust dosing/check levels as indicated to maintain a trough of 15-20 mcg/ml.   Zosyn 3.375 g EI q 8 hours.   Height: 5\' 8"  (172.7 cm) Weight: 215 lb (97.5 kg) IBW/kg (Calculated) : 63.9  Temp (24hrs), Avg:98.3 F (36.8 C), Min:98.3 F (36.8 C), Max:98.3 F (36.8 C)  Recent Labs  Lab 05/14/17 1929  WBC 12.2*  CREATININE 1.45*    Estimated Creatinine Clearance: 48.5 mL/min (A) (by C-G formula based on SCr of 1.45 mg/dL (H)).    No Known Allergies  Antimicrobials this admission: vancomycin 1/14 >>  Zosyn 1/14 >>   Dose adjustments this admission:   Microbiology results:  Thank you for allowing pharmacy to be a part of this patient's care.  Ulice Dash D 05/14/2017 8:22 PM

## 2017-05-14 NOTE — H&P (View-Only) (Signed)
Reason for Consult: Gangrenous changes with osteomyelitis left great toe Referring Physician: [name] is an 62 y.o. female.  HPI: This is a 62 year old female with diabetes and associated neuropathy and history of peripheral vascular disease with recent development of a draining ulceration on her left great toe.  Was seen outpatient with gangrenous changes and radiographic osteomyelitis and admitted for amputation of the great toe and vascular workup.  Past Medical History:  Diagnosis Date  . Anxiety   . Coronary artery disease    a. NSTEMI 06/2014; b. 4 vessel CABG 06/08/2014 LIMA to LAD, SVG to Diagonal, SVG to OM, & SVG to PDA  . Diabetic neuropathy ([place])   . GERD (gastroesophageal reflux disease)   . Heart attack ([place])   . HTN (hypertension)   . IDDM (insulin dependent diabetes mellitus) ([place])   . Ischemic cardiomyopathy    a. echo 06/04/14 EF 40-45%, HK of entire inferolateral and inferior myocardium c.w infarct of RCA/LCx, GR2DD, mild MR  . OA (osteoarthritis) of knee   . Obesity   . Osteoarthritis   . Osteomyelitis ([place])   . Peripheral neuropathy   . Peripheral vascular disease ([place])   . Tobacco abuse     Past Surgical History:  Procedure Laterality Date  . ABDOMINAL AORTOGRAM W/LOWER EXTREMITY N/A 10/27/2016   Procedure: Abdominal Aortogram w/Lower Extremity;  Surgeon: Nelva Bush, MD;  Location: [facility];  Service: Cardiovascular;  Laterality: N/A;  . AMPUTATION TOE Left 10/21/2015   Procedure: AMPUTATION TOE;  Surgeon: Sharlotte Alamo, DPM;  Location: [facility];  Service: Podiatry;  Laterality: Left;  . AORTIC VALVE REPLACEMENT (AVR)/CORONARY ARTERY BYPASS GRAFTING (CABG)    . BREAST BIOPSY    . CARDIAC CATHETERIZATION  06/2014   95% stenosis mLAD, occlusion ostial OM1, 70% stenosis LCx, 95% stenosis mRCA, EF 45%.  . CORONARY ARTERY BYPASS GRAFT N/A 06/08/2014   Procedure: CORONARY ARTERY BYPASS GRAFTING (CABG);  Surgeon: Grace Isaac, MD;  Location:  [facility];  Service: Open Heart Surgery;  Laterality: N/A;  Times 4 using left internal mammary artery to LAD artery and endoscopically harvested bilateral saphenous vein to Obtuse Marginal, Diagonal and Posterior Descending coronary arteries.  . CT ABD W & PELVIS WO CM  06/2014   nl liver, gallbladder, spleen, mild diverticular changes, no bowel wall inflammation, appendix nl, no hernia, no other sig abnormalities  . TEE WITHOUT CARDIOVERSION N/A 06/08/2014   Procedure: TRANSESOPHAGEAL ECHOCARDIOGRAM (TEE);  Surgeon: Grace Isaac, MD;  Location: [facility];  Service: Open Heart Surgery;  Laterality: N/A;  . TOE AMPUTATION Right 12/2011   rt middle toe  . US ECHOCARDIOGRAPHY  06/2014   EF 50-55%, HK of inf/post/inferolat walls, Ao sclerosis    Family History  Problem Relation Age of Onset  . CAD Mother   . Hypertension Mother   . Diabetes Mother   . CAD Father   . Diabetes Father   . Sickle cell trait Other   . Asthma Other     Social History:  reports that she has been smoking cigarettes.  She has a 10.00 pack-year smoking history. she has never used smokeless tobacco. She reports that she does not drink alcohol or use drugs.  Allergies: No Known Allergies  Medications:  Scheduled: . aspirin EC  81 mg Oral Daily  . atorvastatin  80 mg Oral q1800  . clopidogrel  75 mg Oral Daily  . heparin  5,000 Units Subcutaneous Q8H  . insulin aspart  0-5 Units Subcutaneous QHS  . [START ON 05/15/2017] insulin aspart  0-9 Units Subcutaneous TID WC  . insulin glargine  30 Units Subcutaneous Once  . [START ON 05/15/2017] insulin glargine  60 Units Subcutaneous BID  . [START ON 05/15/2017] lisinopril  40 mg Oral Daily  . metoprolol tartrate  25 mg Oral BID  . oxybutynin  5 mg Oral BID    Results for orders placed or performed during the hospital encounter of 05/14/17 (from the past 48 hour(s))  Glucose, capillary     Status: Abnormal   Collection Time: 05/14/17  5:12 PM  Result Value Ref Range    Glucose-Capillary 277 (H) 65 - 99 mg/dL   Comment 1 Notify RN     No results found.  Review of Systems  Constitutional: Negative for chills and fever.  HENT: Negative.   Eyes: Negative.   Respiratory: Negative.   Cardiovascular: Negative.   Gastrointestinal: Negative for nausea and vomiting.  Genitourinary: Negative.   Musculoskeletal:       Patient relates some pain in her left leg  Skin:       Patient relates a draining sore from her left great toe for the past week  Neurological:       Some numbness and paresthesias related to her diabetes  Endo/Heme/Allergies: Negative.   Psychiatric/Behavioral: Negative.    Blood pressure 125/60, pulse (!) 104, temperature 98.3 F (36.8 C), temperature source Oral, SpO2 100 %. Physical Exam  Cardiovascular:  DP and PT pulses are absent on the left.  Trace and thready on the right.  Musculoskeletal:  Previous amputation of the third toe bilateral.  Neurological:  Loss of protective threshold with monofilament wire distally in the forefoot and digits.  Skin:  The skin is warm dry and mildly atrophic.  Significant edema in the left foot and leg as compared to the right.  Some erythema and discoloration in the left great toe with distal gangrenous and necrotic changes at the distal and lateral hallux.  Full-thickness underlying ulceration down through the deeper subcutaneous tissues.    Assessment/Plan: Assessment: 1.  Gangrene with osteomyelitis left great toe. 2.  Diabetes with associated neuropathy. 3.  Peripheral vascular disease.  Plan: Discussed with the patient the need for amputation of the left great toe.  I did discuss her case with Dr. Lucky Cowboy earlier.  Recommended proceeding with the amputation and then he will try to plan for vascular intervention on Wednesday or Thursday.  Discussed with the patient possible risks and complications of the amputation including inability to heal due to infection as well as her diabetes or peripheral  vascular disease.  No guarantees could be given as to the outcome.  We will obtain consent for amputation of the left great toe.  N.p.o. after midnight.  Plan for surgery early tomorrow afternoon  [name] 05/14/2017, 6:37 PM

## 2017-05-14 NOTE — H&P (Signed)
[facility] [place] NAME: Melinda [name]    MR#:  [id number]  DATE OF BIRTH:  08/25/1955  DATE OF ADMISSION:  05/14/2017  PRIMARY CARE PHYSICIAN: Donnie Coffin, MD   REQUESTING/REFERRING PHYSICIAN: Dr. Cleda Mccreedy.  CHIEF COMPLAINT:  No chief complaint on file.  Left great toe gangrene and osteomyelitis HISTORY OF PRESENT ILLNESS:  Melinda [name]  is a 62 y.o. female with a known history of multiple medical problems as below.  The patient was sent by Dr. Cleda Mccreedy for direct admission due to above chief complaint.  The patient has recent development of a draining ulceration on her left great toe.  X-ray show osteomyelitis.  The patient has no feeling of her foot.  She denies any fever or chills.  PAST MEDICAL HISTORY:   Past Medical History:  Diagnosis Date  . Anxiety   . Coronary artery disease    a. NSTEMI 06/2014; b. 4 vessel CABG 06/08/2014 LIMA to LAD, SVG to Diagonal, SVG to OM, & SVG to PDA  . Diabetic neuropathy ([place])   . GERD (gastroesophageal reflux disease)   . Heart attack ([place])   . HTN (hypertension)   . IDDM (insulin dependent diabetes mellitus) ([place])   . Ischemic cardiomyopathy    a. echo 06/04/14 EF 40-45%, HK of entire inferolateral and inferior myocardium c.w infarct of RCA/LCx, GR2DD, mild MR  . OA (osteoarthritis) of knee   . Obesity   . Osteoarthritis   . Osteomyelitis ([place])   . Peripheral neuropathy   . Peripheral vascular disease ([place])   . Tobacco abuse     PAST SURGICAL HISTORY:   Past Surgical History:  Procedure Laterality Date  . ABDOMINAL AORTOGRAM W/LOWER EXTREMITY N/A 10/27/2016   Procedure: Abdominal Aortogram w/Lower Extremity;  Surgeon: Nelva Bush, MD;  Location: [facility];  Service: Cardiovascular;  Laterality: N/A;  . AMPUTATION TOE Left 10/21/2015   Procedure: AMPUTATION TOE;  Surgeon: Sharlotte Alamo, DPM;  Location: [facility];  Service: Podiatry;  Laterality: Left;  . AORTIC VALVE REPLACEMENT  (AVR)/CORONARY ARTERY BYPASS GRAFTING (CABG)    . BREAST BIOPSY    . CARDIAC CATHETERIZATION  06/2014   95% stenosis mLAD, occlusion ostial OM1, 70% stenosis LCx, 95% stenosis mRCA, EF 45%.  . CORONARY ARTERY BYPASS GRAFT N/A 06/08/2014   Procedure: CORONARY ARTERY BYPASS GRAFTING (CABG);  Surgeon: Grace Isaac, MD;  Location: [facility];  Service: Open Heart Surgery;  Laterality: N/A;  Times 4 using left internal mammary artery to LAD artery and endoscopically harvested bilateral saphenous vein to Obtuse Marginal, Diagonal and Posterior Descending coronary arteries.  . CT ABD W & PELVIS WO CM  06/2014   nl liver, gallbladder, spleen, mild diverticular changes, no bowel wall inflammation, appendix nl, no hernia, no other sig abnormalities  . TEE WITHOUT CARDIOVERSION N/A 06/08/2014   Procedure: TRANSESOPHAGEAL ECHOCARDIOGRAM (TEE);  Surgeon: Grace Isaac, MD;  Location: [facility];  Service: Open Heart Surgery;  Laterality: N/A;  . TOE AMPUTATION Right 12/2011   rt middle toe  . US ECHOCARDIOGRAPHY  06/2014   EF 50-55%, HK of inf/post/inferolat walls, Ao sclerosis    SOCIAL HISTORY:   Social History   Tobacco Use  . Smoking status: Current Every Day Smoker    Packs/day: 0.25    Years: 40.00    Pack years: 10.00    Types: Cigarettes  . Smokeless tobacco: Never Used  . Tobacco comment: 4-5 a day  Substance Use  Topics  . Alcohol use: No    Alcohol/week: 0.0 oz    FAMILY HISTORY:   Family History  Problem Relation Age of Onset  . CAD Mother   . Hypertension Mother   . Diabetes Mother   . CAD Father   . Diabetes Father   . Sickle cell trait Other   . Asthma Other     DRUG ALLERGIES:  No Known Allergies  REVIEW OF SYSTEMS:   Review of Systems  Constitutional: Negative for chills, fever and malaise/fatigue.  HENT: Negative for sore throat.   Eyes: Negative for blurred vision and double vision.  Respiratory: Negative for cough, hemoptysis, shortness of breath, wheezing and  stridor.   Cardiovascular: Negative for chest pain, palpitations, orthopnea and leg swelling.  Gastrointestinal: Negative for abdominal pain, blood in stool, diarrhea, melena, nausea and vomiting.  Genitourinary: Negative for dysuria, flank pain and hematuria.  Musculoskeletal: Negative for back pain and joint pain.       Left great toe infection.  Skin: Negative for rash.  Neurological: Negative for dizziness, sensory change, focal weakness, seizures, loss of consciousness, weakness and headaches.  Endo/Heme/Allergies: Negative for polydipsia.  Psychiatric/Behavioral: Negative for depression. The patient is not nervous/anxious.     MEDICATIONS AT HOME:   Prior to Admission medications   Medication Sig Start Date End Date Taking? Authorizing Provider  aspirin EC 81 MG EC tablet Take 1 tablet (81 mg total) by mouth daily. 10/04/15   Hower, Aaron Mose, MD  atorvastatin (LIPITOR) 80 MG tablet Take 1 tablet (80 mg total) by mouth daily at 6 PM. 05/11/17   Gollan, Kathlene November, MD  cephALEXin (KEFLEX) 500 MG capsule Take 1 capsule (500 mg total) by mouth 2 (two) times daily. 02/05/17   Harvest Dark, MD  clopidogrel (PLAVIX) 75 MG tablet Take 1 tablet (75 mg total) by mouth daily. 05/11/17   Minna Merritts, MD  DULoxetine HCl (CYMBALTA PO) Take by mouth 2 (two) times daily.    [provider]  furosemide (LASIX) 20 MG tablet Take 1 tablet (20 mg total) by mouth daily as needed. 05/11/17 05/11/18  Minna Merritts, MD  insulin glargine (LANTUS) 100 UNIT/ML injection Inject 0.6 mLs (60 Units total) into the skin 2 (two) times daily. 10/10/16   Lada, Satira Anis, MD  Insulin Syringe-Needle U-100 (B-D INS SYR ULTRAFINE 1CC/30G) 30G X 1/2" 1 ML MISC Inject sixty units of long-acting insulin twice a day 10/10/16   Arnetha Courser, MD  lisinopril (PRINIVIL,ZESTRIL) 40 MG tablet Take 1 tablet (40 mg total) by mouth daily. 05/11/17   Minna Merritts, MD  metoprolol tartrate (LOPRESSOR) 25 MG tablet Take  1 tablet (25 mg total) by mouth 2 (two) times daily. 05/11/17   Minna Merritts, MD  oxybutynin (DITROPAN-XL) 5 MG 24 hr tablet Take 1 tablet (5 mg total) by mouth 2 (two) times daily. 10/10/16   Lada, Satira Anis, MD  potassium chloride SA (K-DUR,KLOR-CON) 20 MEQ tablet Take 1 tablet (20 mEq total) by mouth daily as needed. 05/11/17 05/11/18  Minna Merritts, MD      VITAL SIGNS:  Blood pressure 125/60, pulse (!) 104, temperature 98.3 F (36.8 C), temperature source Oral, height 5\' 8"  (1.727 m), weight 215 lb (97.5 kg), SpO2 100 %.  PHYSICAL EXAMINATION:  Physical Exam  GENERAL:  63 y.o.-year-old patient lying in the bed with no acute distress.  EYES: Pupils equal, round, reactive to light and accommodation. No scleral icterus. Extraocular muscles intact.  HEENT: Head atraumatic, normocephalic. Oropharynx and nasopharynx clear.  NECK:  Supple, no jugular venous distention. No thyroid enlargement, no tenderness.  LUNGS: Normal breath sounds bilaterally, no wheezing, rales,rhonchi or crepitation. No use of accessory muscles of respiration.  CARDIOVASCULAR: S1, S2 normal. No murmurs, rubs, or gallops.  ABDOMEN: Soft, nontender, nondistended. Bowel sounds present. No organomegaly or mass.  EXTREMITIES: No pedal edema, cyanosis, or clubbing.  Left great toe in dressing.  Bilateral third toe status post amputation. NEUROLOGIC: Cranial nerves II through XII are intact. Muscle strength 5/5 in all extremities. Sensation intact. Gait not checked.  PSYCHIATRIC: The patient is alert and oriented x 3.  SKIN: No obvious rash, lesion, or ulcer.   LABORATORY PANEL:   CBC Recent Labs  Lab 05/14/17 1929  WBC 12.2*  HGB 12.0  HCT 37.7  PLT 324   ------------------------------------------------------------------------------------------------------------------  Chemistries  No results for input(s): NA, K, CL, CO2, GLUCOSE, BUN, CREATININE, CALCIUM, MG, AST, ALT, ALKPHOS, BILITOT in the last 168  hours.  Invalid input(s): GFRCGP ------------------------------------------------------------------------------------------------------------------  Cardiac Enzymes No results for input(s): TROPONINI in the last 168 hours. ------------------------------------------------------------------------------------------------------------------  RADIOLOGY:  No results found.    IMPRESSION AND PLAN:   Left great toe infection and osteomyelitis with leukocytosis. The patient is admitted to the medical floor. Start vancomycin and Zosyn, follow-up CBC. Amputation tomorrow per Dr. Cleda Mccreedy. Hold aspirin and Plavix today and tomorrow for surgery.  PVD.  Vascular surgery consult per Dr. Cleda Mccreedy.  Diabetes.  Start sliding scale, give half dose of Lantus tonight due to n.p.o. status after midnight for surgery.  Resume Lantus 60 units twice daily after surgery.  Diabetes neuropathy.  Pain control.  Hypertension.  Continue home hypertension medication.  Tobacco abuse.  Smoking cessation was counseled for 4 minutes.  Nicotine patch.  All the records are reviewed and case discussed with ED provider. Management plans discussed with the patient, sisters and they are in agreement.  CODE STATUS: Full code  TOTAL TIME TAKING CARE OF THIS PATIENT: 56 minutes.    Demetrios Loll M.D on 05/14/2017 at 8:15 PM  Between 7am to 6pm - Pager - [phone]  After 6pm go to [email or web address] - [employer] [facility]  Office  [phone]  CC: Primary care physician; Donnie Coffin, MD   Note: This dictation was prepared with Dragon dictation along with smaller phrase technology. Any transcriptional errors that result from this process are unin

## 2017-05-15 ENCOUNTER — Encounter: Payer: Self-pay | Admitting: General Practice

## 2017-05-15 ENCOUNTER — Encounter
Admission: AD | Disposition: A | Discharge status: Home / Self Care | Payer: Self-pay | Source: Ambulatory Visit | Attending: Internal Medicine

## 2017-05-15 ENCOUNTER — Inpatient Hospital Stay: Payer: Medicare HMO | Admitting: Anesthesiology

## 2017-05-15 ENCOUNTER — Ambulatory Visit: Admission: RE | Admit: 2017-05-15 | Payer: Medicare Other | Source: Ambulatory Visit | Admitting: Podiatry

## 2017-05-15 ENCOUNTER — Other Ambulatory Visit: Payer: Self-pay

## 2017-05-15 DIAGNOSIS — M79605 Pain in left leg: Secondary | ICD-10-CM

## 2017-05-15 DIAGNOSIS — I96 Gangrene, not elsewhere classified: Secondary | ICD-10-CM

## 2017-05-15 DIAGNOSIS — E119 Type 2 diabetes mellitus without complications: Secondary | ICD-10-CM

## 2017-05-15 DIAGNOSIS — I10 Essential (primary) hypertension: Secondary | ICD-10-CM

## 2017-05-15 HISTORY — PX: AMPUTATION TOE: SHX6595

## 2017-05-15 LAB — GLUCOSE, CAPILLARY
Glucose-Capillary: 267 mg/dL — ABNORMAL HIGH (ref 65–99)
Glucose-Capillary: 142 mg/dL — ABNORMAL HIGH (ref 65–99)
Glucose-Capillary: 108 mg/dL — ABNORMAL HIGH (ref 65–99)
Glucose-Capillary: 149 mg/dL — ABNORMAL HIGH (ref 65–99)
Glucose-Capillary: 289 mg/dL — ABNORMAL HIGH (ref 65–99)

## 2017-05-15 LAB — SURGICAL PCR SCREEN
MRSA, PCR: NEGATIVE
Staphylococcus aureus: NEGATIVE

## 2017-05-15 LAB — HEMOGLOBIN A1C
Hgb A1c MFr Bld: 11.7 % — ABNORMAL HIGH (ref 4.8–5.6)
Mean Plasma Glucose: 289.09 mg/dL

## 2017-05-15 SURGERY — AMPUTATION, TOE
Anesthesia: General | Laterality: Left

## 2017-05-15 MED ORDER — PROPOFOL 500 MG/50ML IV EMUL
INTRAVENOUS | Status: DC | PRN
  Administered 2017-05-15: 50 ug/kg/min via INTRAVENOUS

## 2017-05-15 MED ORDER — INSULIN GLARGINE 100 UNIT/ML ~~LOC~~ SOLN
30.0000 [IU] | Freq: Once | SUBCUTANEOUS | Status: AC
  Administered 2017-05-15: 30 [IU] via SUBCUTANEOUS
  Filled 2017-05-15: qty 0.3

## 2017-05-15 MED ORDER — PROPOFOL 10 MG/ML IV BOLUS
INTRAVENOUS | Status: DC | PRN
  Administered 2017-05-15: 40 mg via INTRAVENOUS

## 2017-05-15 MED ORDER — BUPIVACAINE HCL 0.5 % IJ SOLN
INTRAMUSCULAR | Status: DC | PRN
  Administered 2017-05-15: 10 mL

## 2017-05-15 MED ORDER — ONDANSETRON HCL 4 MG/2ML IJ SOLN: 4.0000 mg | Freq: Once | INTRAMUSCULAR | Status: DC | PRN

## 2017-05-15 MED ORDER — DIPHENHYDRAMINE HCL 25 MG PO CAPS
25.0000 mg | ORAL_CAPSULE | Freq: Four times a day (QID) | ORAL | Status: DC | PRN
  Administered 2017-05-15: 25 mg via ORAL
  Filled 2017-05-15: qty 1

## 2017-05-15 MED ORDER — FENTANYL CITRATE (PF) 100 MCG/2ML IJ SOLN: 25.0000 ug | INTRAMUSCULAR | Status: DC | PRN

## 2017-05-15 MED ORDER — NEOMYCIN-POLYMYXIN B GU 40-200000 IR SOLN: Status: DC | PRN

## 2017-05-15 MED ORDER — LIDOCAINE HCL (CARDIAC) 20 MG/ML IV SOLN
INTRAVENOUS | Status: DC | PRN
  Administered 2017-05-15: 60 mg via INTRAVENOUS

## 2017-05-15 MED ORDER — MIDAZOLAM HCL 2 MG/2ML IJ SOLN
INTRAMUSCULAR | Status: DC | PRN
  Administered 2017-05-15: 2 mg via INTRAVENOUS

## 2017-05-15 MED ORDER — INSULIN GLARGINE 100 UNIT/ML ~~LOC~~ SOLN
30.0000 [IU] | Freq: Two times a day (BID) | SUBCUTANEOUS | Status: DC
  Administered 2017-05-15 – 2017-05-17 (×4): 30 [IU] via SUBCUTANEOUS
  Filled 2017-05-15 (×5): qty 0.3

## 2017-05-15 MED ORDER — INSULIN ASPART 100 UNIT/ML ~~LOC~~ SOLN
0.0000 [IU] | Freq: Three times a day (TID) | SUBCUTANEOUS | Status: DC
  Administered 2017-05-15: 2 [IU] via SUBCUTANEOUS
  Administered 2017-05-16: 5 [IU] via SUBCUTANEOUS
  Administered 2017-05-16: 3 [IU] via SUBCUTANEOUS
  Administered 2017-05-16: 8 [IU] via SUBCUTANEOUS
  Administered 2017-05-17 (×2): 3 [IU] via SUBCUTANEOUS
  Filled 2017-05-15 (×7): qty 1

## 2017-05-15 SURGICAL SUPPLY — 45 items
BANDAGE ACE 4X5 VEL STRL LF (GAUZE/BANDAGES/DRESSINGS) ×3 IMPLANT
BLADE MED AGGRESSIVE (BLADE) ×3 IMPLANT
BLADE OSC/SAGITTAL MD 5.5X18 (BLADE) IMPLANT
BLADE SURG 15 STRL LF DISP TIS (BLADE) ×2 IMPLANT
BLADE SURG 15 STRL SS (BLADE) ×4
BLADE SURG MINI STRL (BLADE) ×3 IMPLANT
BNDG ESMARK 4X12 TAN STRL LF (GAUZE/BANDAGES/DRESSINGS) ×3 IMPLANT
BNDG GAUZE 4.5X4.1 6PLY STRL (MISCELLANEOUS) ×3 IMPLANT
CANISTER SUCT 1200ML W/VALVE (MISCELLANEOUS) ×3 IMPLANT
CLOSURE WOUND 1/4X4 (GAUZE/BANDAGES/DRESSINGS) ×1
CUFF TOURN 18 STER (MISCELLANEOUS) ×3 IMPLANT
CUFF TOURN DUAL PL 12 NO SLV (MISCELLANEOUS) IMPLANT
DRAPE FLUOR MINI C-ARM 54X84 (DRAPES) ×3 IMPLANT
DURAPREP 26ML APPLICATOR (WOUND CARE) ×3 IMPLANT
ELECT REM PT RETURN 9FT ADLT (ELECTROSURGICAL) ×3
ELECTRODE REM PT RTRN 9FT ADLT (ELECTROSURGICAL) ×1 IMPLANT
GAUZE PETRO XEROFOAM 1X8 (MISCELLANEOUS) ×3 IMPLANT
GAUZE SPONGE 4X4 12PLY STRL (GAUZE/BANDAGES/DRESSINGS) ×3 IMPLANT
GAUZE STRETCH 2X75IN STRL (MISCELLANEOUS) ×3 IMPLANT
GLOVE BIO SURGEON STRL SZ7.5 (GLOVE) ×3 IMPLANT
GLOVE INDICATOR 8.0 STRL GRN (GLOVE) ×3 IMPLANT
GOWN STRL REUS W/ TWL LRG LVL3 (GOWN DISPOSABLE) ×2 IMPLANT
GOWN STRL REUS W/TWL LRG LVL3 (GOWN DISPOSABLE) ×4
HANDPIECE VERSAJET DEBRIDEMENT (MISCELLANEOUS) IMPLANT
KIT RM TURNOVER STRD PROC AR (KITS) ×3 IMPLANT
LABEL OR SOLS (LABEL) IMPLANT
NEEDLE FILTER BLUNT 18X 1/2SAF (NEEDLE) ×2
NEEDLE FILTER BLUNT 18X1 1/2 (NEEDLE) ×1 IMPLANT
NEEDLE HYPO 25X1 1.5 SAFETY (NEEDLE) ×6 IMPLANT
NS IRRIG 500ML POUR BTL (IV SOLUTION) ×3 IMPLANT
PACK EXTREMITY ARMC (MISCELLANEOUS) ×3 IMPLANT
PAD ABD DERMACEA PRESS 5X9 (GAUZE/BANDAGES/DRESSINGS) ×3 IMPLANT
SOL .9 NS 3000ML IRR  AL (IV SOLUTION) ×2
SOL .9 NS 3000ML IRR UROMATIC (IV SOLUTION) ×1 IMPLANT
SOL PREP PVP 2OZ (MISCELLANEOUS) ×3
SOLUTION PREP PVP 2OZ (MISCELLANEOUS) ×1 IMPLANT
STOCKINETTE STRL 6IN 960660 (GAUZE/BANDAGES/DRESSINGS) ×3 IMPLANT
STRIP CLOSURE SKIN 1/4X4 (GAUZE/BANDAGES/DRESSINGS) ×2 IMPLANT
SUT ETHILON 3-0 FS-10 30 BLK (SUTURE) ×3
SUT ETHILON 4-0 (SUTURE)
SUT ETHILON 4-0 FS2 18XMFL BLK (SUTURE)
SUTURE EHLN 3-0 FS-10 30 BLK (SUTURE) ×1 IMPLANT
SUTURE ETHLN 4-0 FS2 18XMF BLK (SUTURE) IMPLANT
SWAB DUAL CULTURE TRANS RED ST (MISCELLANEOUS) IMPLANT
SYR 10ML LL (SYRINGE) ×3 IMPLANT

## 2017-05-15 NOTE — Anesthesia Post-op Follow-up Note (Signed)
Anesthesia QCDR form completed.        

## 2017-05-15 NOTE — Op Note (Signed)
Date of operation: 05/15/2017.  Surgeon: [name] D.P.M.  Preoperative diagnosis: Gangrene with osteomyelitis left great toe.  Postoperative diagnosis: Same.  Procedure: Amputation left great toe.  Anesthesia: Local with sedation.  Hemostasis: None.  Estimated blood loss: 15 cc.  Pathology: Left great toe.  Cultures: Bone cultures distal phalanx left hallux.  Complications: None apparent.  Operative indications: This is a 62 year old female with recent development of an ulceration on her left great toe.  Radiographs confirmed osteomyelitis and patient was admitted for amputation as well as vascular workup.  Operative procedure: Patient was taken to the operating room and placed on the table in the supine position.  Following satisfactory sedation the left foot was anesthetized with 10 cc of 0.5% Marcaine plain around the first metatarsal.  The foot was then prepped and draped in the usual sterile fashion.      Attention was then directed to the distal aspect of the left foot where an elliptical fishmouth type incision was placed coursing medial to lateral around the base of the great toe.  The incision was deepened sharply down to the level of the bone and periosteal dissection carried back to the mid shaft of the proximal phalanx which was incised with a sagittal saw and the toe removed in toto leaving the base intact at the metatarsal phalangeal joint.  Good healthy skin and subcutaneous tissue was noted.  The wound was flushed with copious amounts of sterile saline and closed using 3-0 nylon vertical mattress and simple interrupted sutures.  Xeroform 4 x 4's ABD Kerlix and an Ace wrap applied to the left lower extremity.  Patient tolerated the procedure and anesthesia well and was transported to the PACU with vital signs stable in good condition.

## 2017-05-15 NOTE — Progress Notes (Signed)
Pt notified RN that she found a "knot in left armpit." There is a firm, small, pea-sized nodule in left axilla. There is no pain. Dr. Margaretmary Eddy notified via text page.   [place], Jerry Caras

## 2017-05-15 NOTE — Progress Notes (Signed)
Inpatient Diabetes Program Recommendations  AACE/ADA: New Consensus Statement on Inpatient Glycemic Control (2015)  Target Ranges:  Prepandial:   less than 140 mg/dL      Peak postprandial:   less than 180 mg/dL (1-2 hours)      Critically ill patients:  140 - 180 mg/dL   Results for SAMARIE, PINDER (MRN [id number]) as of 05/15/2017 09:29  Ref. Range 05/14/2017 17:12 05/14/2017 21:07 05/15/2017 07:37  Glucose-Capillary Latest Ref Range: 65 - 99 mg/dL 277 (H) 362 (H) 267 (H)   Review of Glycemic Control  Diabetes history: DM2 Outpatient Diabetes medications: Lantus 60 units BID Current orders for Inpatient glycemic control: Lantus 60 units BID, Novolog 0-9 units TID with meals, Novolog 0-5 units QHS  Inpatient Diabetes Program Recommendations: Insulin - Basal: Noted patient received Lantus 30 units last night due to being NPO today. Please consider decreasing Lantus to 30 units BID. Correction (SSI): Please consider increasing Novolog correction to Moderate scale. HgbA1C: A1C in process.  Thanks, Barnie Alderman, RN, MSN, CDE Diabetes Coordinator Inpatient Diabetes Program [phone] (Team Pager from 8am to 5pm)

## 2017-05-15 NOTE — Transfer of Care (Signed)
Immediate Anesthesia Transfer of Care Note  Patient: Melinda [name]  Procedure(s) Performed: AMPUTATION LEFT GREAT TOE (Left )  Patient Location: PACU  Anesthesia Type:MAC  Level of Consciousness: awake, alert , oriented and patient cooperative  Airway & Oxygen Therapy: Patient Spontanous Breathing and Patient connected to face mask oxygen  Post-op Assessment: Report given to RN and Post -op Vital signs reviewed and stable  Post vital signs: Reviewed and stable  Last Vitals:  Vitals:   05/15/17 0802 05/15/17 1246  BP: (!) 130/44 131/63  Pulse: 71 77  Resp: 17 18  Temp: 36.7 C (!) 36.3 C  SpO2: 98% 100%    Last Pain:  Vitals:   05/15/17 1246  TempSrc: Tympanic  PainSc: 0-No pain         Complications: No apparent anesthesia complications

## 2017-05-15 NOTE — Anesthesia Preprocedure Evaluation (Addendum)
Anesthesia Evaluation  Patient identified by MRN, date of birth, ID band Patient awake    Reviewed: Allergy & Precautions, H&P , NPO status , Patient's Chart, lab work & pertinent test results, reviewed documented beta blocker date and time   History of Anesthesia Complications Negative for: history of anesthetic complications  Airway Mallampati: II  TM Distance: >3 FB Neck ROM: full    Dental no notable dental hx. (+) Edentulous Upper, Upper Dentures, Poor Dentition, Missing   Pulmonary shortness of breath and with exertion, sleep apnea (likely based on history) , neg COPD, neg recent URI, Current Smoker,    Pulmonary exam normal breath sounds clear to auscultation       Cardiovascular Exercise Tolerance: Good hypertension, On Medications and On Home Beta Blockers (-) angina+ CAD, + Past MI, + CABG and + DOE  (-) Cardiac Stents Normal cardiovascular exam(-) dysrhythmias (-) Valvular Problems/Murmurs Rhythm:regular Rate:Normal  Ischemic cardiomyopathy   Neuro/Psych neg Seizures  Neuromuscular disease (peripheral neuropathy) negative psych ROS   GI/Hepatic Neg liver ROS, GERD  Poorly Controlled,  Endo/Other  diabetes, Insulin Dependent  Renal/GU negative Renal ROS  negative genitourinary   Musculoskeletal   Abdominal   Peds  Hematology negative hematology ROS (+)   Anesthesia Other Findings Past Medical History:   HTN (hypertension)                                           Tobacco abuse                                                Anxiety                                                      Peripheral neuropathy ([facility])                                  Obesity                                                      Coronary artery disease                                        Comment:a. NSTEMI 06/2014; b. 4 vessel CABG 06/08/2014               LIMA to LAD, SVG to Diagonal, SVG to OM, & SVG               to PDA  Ischemic cardiomyopathy                                        Comment:a. echo 06/04/14 EF 40-45%, HK of entire  inferolateral and inferior myocardium c.w               infarct of RCA/LCx, GR2DD, mild MR   IDDM (insulin dependent diabetes mellitus) ([facility]*              GERD (gastroesophageal reflux disease)                       OA (osteoarthritis) of knee                                  Osteomyelitis ([facility])                                          Reproductive/Obstetrics negative OB ROS                             Anesthesia Physical  Anesthesia Plan  ASA: III  Anesthesia Plan: General   Post-op Pain Management:    Induction: Intravenous  PONV Risk Score and Plan: 2 and TIVA  Airway Management Planned: Simple Face Mask  Additional Equipment:   Intra-op Plan:   Post-operative Plan:   Informed Consent: I have reviewed the patients History and Physical, chart, labs and discussed the procedure including the risks, benefits and alternatives for the proposed anesthesia with the patient or authorized representative who has indicated his/her understanding and acceptance.   Dental Advisory Given  Plan Discussed with: Anesthesiologist, CRNA and Surgeon  Anesthesia Plan Comments:        Anesthesia Quick Evaluation

## 2017-05-15 NOTE — Consult Note (Signed)
[facility] Vascular Consult Note  MRN : [id number]  Melinda [name] is a 62 y.o. (05-18-1955) female who presents with chief complaint of No chief complaint on file. Marland Kitchen  History of Present Illness: I am asked to see the patient by Dr. 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  The patient has had weeks to months of worsening skin breakdown on her left great toe.  She was seen by podiatry who found that she had frank gangrene and she was admitted to the hospital yesterday.  She does not have any fever or chills.  Her pain in that foot and toe is significant and unrelenting.  She apparently had an angiogram some 6 months ago which showed an SFA occlusion at another institution.  No intervention was performed and no follow-up was done to determine possible plans for improved perfusion.  The full details of this are not entirely clear to me.  Current Facility-Administered Medications  Medication Dose Route Frequency Provider Last Rate Last Dose  . 0.9 %  sodium chloride infusion   Intravenous Continuous Demetrios Loll, MD 75 mL/hr at 05/15/17 1106    . [MAR Hold] acetaminophen (TYLENOL) tablet 650 mg  650 mg Oral Q6H PRN Demetrios Loll, MD       Or  . Doug Sou Hold] acetaminophen (TYLENOL) suppository 650 mg  650 mg Rectal Q6H PRN Demetrios Loll, MD      . Doug Sou Hold] albuterol (PROVENTIL) (2.5 MG/3ML) 0.083% nebulizer solution 2.5 mg  2.5 mg Nebulization Q2H PRN Demetrios Loll, MD      . Doug Sou Hold] aspirin EC tablet 81 mg  81 mg Oral Daily Demetrios Loll, MD      . Doug Sou Hold] atorvastatin (LIPITOR) tablet 80 mg  80 mg Oral q1800 Demetrios Loll, MD   80 mg at 05/14/17 1829  . [MAR Hold] bisacodyl (DULCOLAX) EC tablet 5 mg  5 mg Oral Daily PRN Demetrios Loll, MD      . Doug Sou Hold] clopidogrel (PLAVIX) tablet 75 mg  75 mg Oral Daily Demetrios Loll, MD      . Doug Sou Hold] diphenhydrAMINE (BENADRYL) capsule 25 mg  25 mg Oral Q6H PRN Gouru, Aruna, MD   25 mg at 05/15/17 1104  . [MAR Hold]  HYDROcodone-acetaminophen (NORCO/VICODIN) 5-325 MG per tablet 1-2 tablet  1-2 tablet Oral Q4H PRN Demetrios Loll, MD   2 tablet at 05/15/17 0556  . [MAR Hold] insulin aspart (novoLOG) injection 0-15 Units  0-15 Units Subcutaneous TID WC Gouru, Aruna, MD      . Doug Sou Hold] insulin aspart (novoLOG) injection 0-5 Units  0-5 Units Subcutaneous QHS Demetrios Loll, MD   5 Units at 05/14/17 2132  . [MAR Hold] insulin glargine (LANTUS) injection 30 Units  30 Units Subcutaneous BID Gouru, Aruna, MD      . Doug Sou Hold] lisinopril (PRINIVIL,ZESTRIL) tablet 40 mg  40 mg Oral Daily Demetrios Loll, MD      . Doug Sou Hold] metoprolol tartrate (LOPRESSOR) tablet 25 mg  25 mg Oral BID Demetrios Loll, MD   25 mg at 05/15/17 0813  . [MAR Hold] ondansetron (ZOFRAN) tablet 4 mg  4 mg Oral Q6H PRN Demetrios Loll, MD       Or  . Doug Sou Hold] ondansetron [facility]) injection 4 mg  4 mg Intravenous Q6H PRN Demetrios Loll, MD      . Doug Sou Hold] oxybutynin (DITROPAN-XL) 24 hr tablet 5 mg  5 mg Oral BID Demetrios Loll, MD  5 mg at 05/14/17 2127  . [MAR Hold] piperacillin-tazobactam (ZOSYN) IVPB 3.375 g  3.375 g Intravenous Robynn Pane, MD 12.5 mL/hr at 05/15/17 1232 3.375 g at 05/15/17 1232  . [MAR Hold] senna-docusate (Senokot-S) tablet 1 tablet  1 tablet Oral QHS PRN Demetrios Loll, MD      . Doug Sou Hold] vancomycin [facility]) IVPB 1000 mg/200 mL premix  1,000 mg Intravenous Q18H Demetrios Loll, MD   Stopped at 05/15/17 [phone]    Past Medical History:  Diagnosis Date  . Anxiety   . Coronary artery disease    a. NSTEMI 06/2014; b. 4 vessel CABG 06/08/2014 LIMA to LAD, SVG to Diagonal, SVG to OM, & SVG to PDA  . Diabetic neuropathy ([place])   . GERD (gastroesophageal reflux disease)   . Heart attack ([place])   . HTN (hypertension)   . IDDM (insulin dependent diabetes mellitus) ([place])   . Ischemic cardiomyopathy    a. echo 06/04/14 EF 40-45%, HK of entire inferolateral and inferior myocardium c.w infarct of RCA/LCx, GR2DD, mild MR  . OA (osteoarthritis) of knee   . Obesity    . Osteoarthritis   . Osteomyelitis ([place])   . Peripheral neuropathy   . Peripheral vascular disease ([place])   . Tobacco abuse     Past Surgical History:  Procedure Laterality Date  . ABDOMINAL AORTOGRAM W/LOWER EXTREMITY N/A 10/27/2016   Procedure: Abdominal Aortogram w/Lower Extremity;  Surgeon: Nelva Bush, MD;  Location: [place] [facility];  Service: Cardiovascular;  Laterality: N/A;  . AMPUTATION TOE Left 10/21/2015   Procedure: AMPUTATION TOE;  Surgeon: Sharlotte Alamo, DPM;  Location: [facility];  Service: Podiatry;  Laterality: Left;  . AORTIC VALVE REPLACEMENT (AVR)/CORONARY ARTERY BYPASS GRAFTING (CABG)    . BREAST BIOPSY    . CARDIAC CATHETERIZATION  06/2014   95% stenosis mLAD, occlusion ostial OM1, 70% stenosis LCx, 95% stenosis mRCA, EF 45%.  . CORONARY ARTERY BYPASS GRAFT N/A 06/08/2014   Procedure: CORONARY ARTERY BYPASS GRAFTING (CABG);  Surgeon: Grace Isaac, MD;  Location: [place];  Service: Open Heart Surgery;  Laterality: N/A;  Times 4 using left internal mammary artery to LAD artery and endoscopically harvested bilateral saphenous vein to Obtuse Marginal, Diagonal and Posterior Descending coronary arteries.  . CT ABD W & PELVIS WO CM  06/2014   nl liver, gallbladder, spleen, mild diverticular changes, no bowel wall inflammation, appendix nl, no hernia, no other sig abnormalities  . TEE WITHOUT CARDIOVERSION N/A 06/08/2014   Procedure: TRANSESOPHAGEAL ECHOCARDIOGRAM (TEE);  Surgeon: Grace Isaac, MD;  Location: [place];  Service: Open Heart Surgery;  Laterality: N/A;  . TOE AMPUTATION Right 12/2011   rt middle toe  . US ECHOCARDIOGRAPHY  06/2014   EF 50-55%, HK of inf/post/inferolat walls, Ao sclerosis    Social History Social History   Tobacco Use  . Smoking status: Current Every Day Smoker    Packs/day: 0.25    Years: 40.00    Pack years: 10.00    Types: Cigarettes  . Smokeless tobacco: Never Used  . Tobacco comment: 4-5 a day  Substance Use Topics  .  Alcohol use: No    Alcohol/week: 0.0 oz  . Drug use: No    Family History Family History  Problem Relation Age of Onset  . CAD Mother   . Hypertension Mother   . Diabetes Mother   . CAD Father   . Diabetes Father   . Sickle cell trait Other   . Asthma Other  No Known Allergies   REVIEW OF SYSTEMS (Negative unless checked)  Constitutional: [] Weight loss  [] Fever  [] Chills Cardiac: [] Chest pain   [] Chest pressure   [x] Palpitations   [] Shortness of breath when laying flat   [] Shortness of breath at rest   [x] Shortness of breath with exertion. Vascular:  [] Pain in legs with walking   [] Pain in legs at rest   [] Pain in legs when laying flat   [] Claudication   [] Pain in feet when walking  [] Pain in feet at rest  [] Pain in feet when laying flat   [] History of DVT   [] Phlebitis   [] Swelling in legs   [] Varicose veins   [x] Non-healing ulcers Pulmonary:   [] Uses home oxygen   [] Productive cough   [] Hemoptysis   [] Wheeze  [] COPD   [] Asthma Neurologic:  [] Dizziness  [] Blackouts   [] Seizures   [] History of stroke   [] History of TIA  [] Aphasia   [] Temporary blindness   [] Dysphagia   [] Weakness or numbness in arms   [] Weakness or numbness in legs Musculoskeletal:  [] Arthritis   [] Joint swelling   [] Joint pain   [] Low back pain Hematologic:  [] Easy bruising  [] Easy bleeding   [] Hypercoagulable state   [] Anemic  [] Hepatitis Gastrointestinal:  [] Blood in stool   [] Vomiting blood  [x] Gastroesophageal reflux/heartburn   [] Difficulty swallowing. Genitourinary:  [] Chronic kidney disease   [] Difficult urination  [] Frequent urination  [] Burning with urination   [] Blood in urine Skin:  [] Rashes   [x] Ulcers   [x] Wounds Psychological:  [] History of anxiety   []  History of major depression.  Physical Examination  Vitals:   05/15/17 0802 05/15/17 1246 05/15/17 1420 05/15/17 1435  BP: (!) 130/44 131/63 (!) 119/93 136/65  Pulse: 71 77 (!) 27 (!) 26  Resp: 17 18 (!) 22 14  Temp: 98 F (36.7 C) (!) 97.3  F (36.3 C)    TempSrc: Oral Tympanic    SpO2: 98% 100% 100% 96%  Weight:  97.5 kg (215 lb)    Height:  5\' 8"  (1.727 m)     Body mass index is 32.69 kg/m. Gen:  WD/WN, NAD Head: [place]/AT, No temporalis wasting.  Ear/Nose/Throat: Hearing grossly intact, nares w/o erythema or drainage, oropharynx w/o Erythema/Exudate Eyes: Sclera non-icteric, conjunctiva clear Neck: Trachea midline.  supple  Pulmonary:  Good air movement, respirations not labored, equal bilaterally.  Cardiac: RRR, no JVD Vascular:  Vessel Right Left  Radial Palpable Palpable                          PT  1+ palpable  not palpable  DP  1+ palpable  trace palpable    Musculoskeletal: M/S 5/5 throughout.  Left great toe with dark discoloration and mild surrounding erythema.  No deformity or atrophy.  1+ left lower extremity edema. Neurologic: Sensation grossly intact in extremities.  Symmetrical.  Speech is fluent. Motor exam as listed above. Psychiatric: Judgment intact, Mood & affect appropriate for pt's clinical situation. Dermatologic: Great toe with ulceration and gangrenous changes as above      CBC Lab Results  Component Value Date   WBC 12.2 (H) 05/14/2017   HGB 12.0 05/14/2017   HCT 37.7 05/14/2017   MCV 75.3 (L) 05/14/2017   PLT 324 05/14/2017    BMET    Component Value Date/Time   NA 138 05/14/2017 1929   NA 139 10/10/2016 1911   NA 140 06/03/2014 0405   K 4.3 05/14/2017 1929   K 4.1  06/03/2014 0405   CL 104 05/14/2017 1929   CL 107 06/03/2014 0405   CO2 26 05/14/2017 1929   CO2 26 06/03/2014 0405   GLUCOSE 287 (H) 05/14/2017 1929   GLUCOSE 227 (H) 06/03/2014 0405   BUN 21 (H) 05/14/2017 1929   BUN 16 10/10/2016 1911   BUN 13 06/03/2014 0405   CREATININE 1.45 (H) 05/14/2017 1929   CREATININE 1.13 06/03/2014 0405   CALCIUM 9.1 05/14/2017 1929   CALCIUM 8.1 (L) 06/03/2014 0405   GFRNONAA 37 (L) 05/14/2017 1929   GFRNONAA 52 (L) 06/03/2014 0405   GFRNONAA 54 (L) 11/28/2013 1754    GFRAA 43 (L) 05/14/2017 1929   GFRAA >60 06/03/2014 0405   GFRAA >60 11/28/2013 1754   Estimated Creatinine Clearance: 48.5 mL/min (A) (by C-G formula based on SCr of 1.45 mg/dL (H)).  COAG Lab Results  Component Value Date   INR 1.03 05/14/2017   INR 1.05 10/20/2016   INR 1.28 06/08/2014    Radiology No results found.    Assessment/Plan 1.  PAD with gangrene left foot.  The patient has had known severe peripheral arterial disease for at least 6 months but apparently no intervention was performed for reasons that are not clear to me.  At this point, this is clearly a limb threatening situation.  It is possible that was not limb threatening initially, but now with skin breakdown and gangrene if we do not perform revascularization limb loss is likely.  I have recommended that she get an angiogram.  This will be performed on Thursday, January 17.  Risks and benefits have been discussed.  Podiatry is debriding her foot today. 2.  Cardiac disease.  Monitor during procedures while in the hospital.   3.  Diabetes.  On outpatient medications and blood glucose control important in reducing the progression of atherosclerotic disease. Also, involved in wound healing. On appropriate medications. 4.  Hypertension.  Stable on outpatient medications and blood pressure control important in reducing the progression of atherosclerotic disease. On appropriate oral medications.    [name], MD  05/15/2017 2:44 PM    This note was created with Dragon medical transcription system.  Any error is purely unintentional

## 2017-05-15 NOTE — Progress Notes (Signed)
[place] at [place] NAME: Melinda [name]    MR#:  [id number]  DATE OF BIRTH:  1955-06-16  SUBJECTIVE:  CHIEF COMPLAINT:  Pt is resting comfortably and scheduled for surgery by podiatry today.  Pain is well controlled  REVIEW OF SYSTEMS:  CONSTITUTIONAL: No fever, fatigue or weakness.  EYES: No blurred or double vision.  EARS, NOSE, AND THROAT: No tinnitus or ear pain.  RESPIRATORY: No cough, shortness of breath, wheezing or hemoptysis.  CARDIOVASCULAR: No chest pain, orthopnea, edema.  GASTROINTESTINAL: No nausea, vomiting, diarrhea or abdominal pain.  GENITOURINARY: No dysuria, hematuria.  ENDOCRINE: No polyuria, nocturia,  HEMATOLOGY: No anemia, easy bruising or bleeding SKIN: No rash or lesion. MUSCULOSKELETAL: Left leg infection no joint pain or arthritis.   NEUROLOGIC: No tingling, numbness, weakness.  PSYCHIATRY: No anxiety or depression.   DRUG ALLERGIES:  No Known Allergies  VITALS:  Blood pressure 139/78, pulse 73, temperature 97.6 F (36.4 C), temperature source Oral, resp. rate 17, height 5\' 8"  (1.727 m), weight 97.5 kg (215 lb), SpO2 98 %.  PHYSICAL EXAMINATION:  GENERAL:  62 y.o.-year-old patient lying in the bed with no acute distress.  EYES: Pupils equal, round, reactive to light and accommodation. No scleral icterus. Extraocular muscles intact.  HEENT: Head atraumatic, normocephalic. Oropharynx and nasopharynx clear.  NECK:  Supple, no jugular venous distention. No thyroid enlargement, no tenderness.  LUNGS: Normal breath sounds bilaterally, no wheezing, rales,rhonchi or crepitation. No use of accessory muscles of respiration.  CARDIOVASCULAR: S1, S2 normal. No murmurs, rubs, or gallops.  ABDOMEN: Soft, nontender, nondistended. Bowel sounds present. No organomegaly or mass.  EXTREMITIES: Left lower extremity gangrene no pedal edema, or clubbing.  NEUROLOGIC: Cranial nerves II through XII are intact. Muscle  strength 5/5 in all extremities. Sensation intact. Gait not checked.  PSYCHIATRIC: The patient is alert and oriented x 3.  SKIN: No obvious rash, lesion, or ulcer.    LABORATORY PANEL:   CBC Recent Labs  Lab 05/14/17 1929  WBC 12.2*  HGB 12.0  HCT 37.7  PLT 324   ------------------------------------------------------------------------------------------------------------------  Chemistries  Recent Labs  Lab 05/14/17 1929  NA 138  K 4.3  CL 104  CO2 26  GLUCOSE 287*  BUN 21*  CREATININE 1.45*  CALCIUM 9.1  MG 1.7  AST 15  ALT 13*  ALKPHOS 120  BILITOT 0.5   ------------------------------------------------------------------------------------------------------------------  Cardiac Enzymes No results for input(s): TROPONINI in the last 168 hours. ------------------------------------------------------------------------------------------------------------------  RADIOLOGY:  No results found.  EKG:   Orders placed or performed in visit on 11/22/16  . EKG 12-Lead    ASSESSMENT AND PLAN:    Left great toe infection and osteomyelitis with leukocytosis. Pain management as needed vancomycin and Zosyn, follow-up CBC.  Amputation/wound debridement  per Dr. Cleda Mccreedy today Hold aspirin and Plavix today and tomorrow for surgery.  PVD.  Vascular surgery is planning for angiogram on Thursday  Left axillary swelling-does not seem to be infected, PCP to monitor closely  Diabetes.   sliding scale, give half dose of Lantus tonight due to n.p.o. status after midnight for surgery.  Resume Lantus 60 units twice daily after surgery.  Diabetes neuropathy.  Pain control.  Hypertension.  Continue home hypertension medication.  Tobacco abuse.  Smoking cessation was counseled for 4 minutes.  Nicotine patch        All the records are reviewed and case discussed with Care Management/Social Workerr. Management plans discussed with the patient, family and they  are in  agreement.  CODE STATUS: fc   TOTAL TIME TAKING CARE OF THIS PATIENT: 35  minutes.   POSSIBLE D/C IN 3-4  DAYS, DEPENDING ON CLINICAL CONDITION.  Note: This dictation was prepared with Dragon dictation along with smaller phrase technology. Any transcriptional errors that result from this process are unintentional.   Melinda [name] M.D on 05/15/2017 at 4:13 PM  Between 7am to 6pm - Pager - [phone] After 6pm go to [email or web address] - password [email or web address] [facility]  [facility]  Office  [phone]  CC: Primary care physician; Donnie Coffin, MD

## 2017-05-15 NOTE — Interval H&P Note (Signed)
History and Physical Interval Note:  05/15/2017 1:14 PM  Melinda [name]  has presented today for surgery, with the diagnosis of Gangrene with osteomyelitis left great toe  The various methods of treatment have been discussed with the patient and family. After consideration of risks, benefits and other options for treatment, the patient has consented to  Procedure(s): AMPUTATION LEFT GREAT TOE (Left) as a surgical intervention .  The patient's history has been reviewed, patient examined, no change in status, stable for surgery.  I have reviewed the patient's chart and labs.  Questions were answered to the patient's satisfaction.     [name]

## 2017-05-16 ENCOUNTER — Encounter: Payer: Self-pay | Admitting: Podiatry

## 2017-05-16 LAB — CBC
HCT: 34.9 % — ABNORMAL LOW (ref 35.0–47.0)
Hemoglobin: 11.3 g/dL — ABNORMAL LOW (ref 12.0–16.0)
MCH: 24.2 pg — ABNORMAL LOW (ref 26.0–34.0)
MCHC: 32.4 g/dL (ref 32.0–36.0)
MCV: 74.9 fL — ABNORMAL LOW (ref 80.0–100.0)
Platelets: 347 10*3/uL (ref 150–440)
RBC: 4.66 MIL/uL (ref 3.80–5.20)
RDW: 16.7 % — ABNORMAL HIGH (ref 11.5–14.5)
WBC: 10.2 10*3/uL (ref 3.6–11.0)

## 2017-05-16 LAB — GLUCOSE, CAPILLARY
Glucose-Capillary: 148 mg/dL — ABNORMAL HIGH (ref 65–99)
Glucose-Capillary: 164 mg/dL — ABNORMAL HIGH (ref 65–99)
Glucose-Capillary: 192 mg/dL — ABNORMAL HIGH (ref 65–99)
Glucose-Capillary: 243 mg/dL — ABNORMAL HIGH (ref 65–99)
Glucose-Capillary: 251 mg/dL — ABNORMAL HIGH (ref 65–99)

## 2017-05-16 LAB — BASIC METABOLIC PANEL
Anion gap: 7 (ref 5–15)
BUN: 15 mg/dL (ref 6–20)
CO2: 27 mmol/L (ref 22–32)
Calcium: 8.8 mg/dL — ABNORMAL LOW (ref 8.9–10.3)
Chloride: 103 mmol/L (ref 101–111)
Creatinine, Ser: 1.29 mg/dL — ABNORMAL HIGH (ref 0.44–1.00)
GFR calc Af Amer: 50 mL/min — ABNORMAL LOW (ref >60)
GFR calc non Af Amer: 43 mL/min — ABNORMAL LOW (ref >60)
Glucose, Bld: 173 mg/dL — ABNORMAL HIGH (ref 65–99)
Potassium: 4.6 mmol/L (ref 3.5–5.1)
Sodium: 137 mmol/L (ref 135–145)

## 2017-05-16 LAB — HIV ANTIBODY (ROUTINE TESTING W REFLEX): HIV Screen 4th Generation wRfx: NONREACTIVE

## 2017-05-16 MED ORDER — SODIUM CHLORIDE 0.9 % IV SOLN: INTRAVENOUS | Status: DC

## 2017-05-16 MED ORDER — INFLUENZA VAC SPLIT QUAD 0.5 ML IM SUSY
0.5000 mL | PREFILLED_SYRINGE | INTRAMUSCULAR | Status: AC
  Administered 2017-05-17: 0.5 mL via INTRAMUSCULAR
  Filled 2017-05-16: qty 0.5

## 2017-05-16 MED ORDER — INSULIN ASPART 100 UNIT/ML ~~LOC~~ SOLN
3.0000 [IU] | Freq: Three times a day (TID) | SUBCUTANEOUS | Status: DC
  Administered 2017-05-16 – 2017-05-17 (×3): 3 [IU] via SUBCUTANEOUS
  Filled 2017-05-16 (×3): qty 1

## 2017-05-16 MED ORDER — GABAPENTIN 400 MG PO CAPS
1200.0000 mg | ORAL_CAPSULE | Freq: Three times a day (TID) | ORAL | Status: DC
  Administered 2017-05-16 – 2017-05-17 (×4): 1200 mg via ORAL
  Filled 2017-05-16 (×4): qty 3

## 2017-05-16 MED ORDER — GABAPENTIN 400 MG PO CAPS: 400.0000 mg | ORAL_CAPSULE | Freq: Three times a day (TID) | ORAL | Status: DC

## 2017-05-16 NOTE — Care Management (Signed)
Met with patient to discuss discharge planning. Patient states she is having an angiogram tomorrow and would like to see the outcomes of this procedure before making a decision. RNCM to follow up tomorrow after angiogram.

## 2017-05-16 NOTE — Progress Notes (Addendum)
[place] at [place] NAME: Melinda [name]    MR#:  [id number]  DATE OF BIRTH:  [birth year]  SUBJECTIVE:  CHIEF COMPLAINT:  Pt is resting comfortably pain is tolerable,  REVIEW OF SYSTEMS:  CONSTITUTIONAL: No fever, fatigue or weakness.  EYES: No blurred or double vision.  EARS, NOSE, AND THROAT: No tinnitus or ear pain.  RESPIRATORY: No cough, shortness of breath, wheezing or hemoptysis.  CARDIOVASCULAR: No chest pain, orthopnea, edema.  GASTROINTESTINAL: No nausea, vomiting, diarrhea or abdominal pain.  GENITOURINARY: No dysuria, hematuria.  ENDOCRINE: No polyuria, nocturia,  HEMATOLOGY: No anemia, easy bruising or bleeding SKIN: No rash or lesion. MUSCULOSKELETAL: Left leg infection no joint pain or arthritis.   NEUROLOGIC: No tingling, numbness, weakness.  PSYCHIATRY: No anxiety or depression.   DRUG ALLERGIES:  No Known Allergies  VITALS:  Blood pressure (!) 122/46, pulse 80, temperature 98.4 F (36.9 C), temperature source Oral, resp. rate 18, height 5\' 8"  (1.727 m), weight 97.5 kg (215 lb), SpO2 98 %.  PHYSICAL EXAMINATION:  GENERAL:  62 y.o.-year-old patient lying in the bed with no acute distress.  EYES: Pupils equal, round, reactive to light and accommodation. No scleral icterus. Extraocular muscles intact.  HEENT: Head atraumatic, normocephalic. Oropharynx and nasopharynx clear.  NECK:  Supple, no jugular venous distention. No thyroid enlargement, no tenderness.  LUNGS: Normal breath sounds bilaterally, no wheezing, rales,rhonchi or crepitation. No use of accessory muscles of respiration.  CARDIOVASCULAR: S1, S2 normal. No murmurs, rubs, or gallops.  ABDOMEN: Soft, nontender, nondistended. Bowel sounds present. No organomegaly or mass.  EXTREMITIES: Left axillary area with superficial small soft, mobile swelling nontender, not erythematous ,left lower extremity,  status post surgery,  clean dressing  no pedal edema, or  clubbing.  NEUROLOGIC: Cranial nerves II through XII are intact. Muscle strength 5/5 in all extremities. Sensation intact. Gait not checked.  PSYCHIATRIC: The patient is alert and oriented x 3.  SKIN: No obvious rash, lesion, or ulcer.    LABORATORY PANEL:   CBC Recent Labs  Lab 05/16/17 0639  WBC 10.2  HGB 11.3*  HCT 34.9*  PLT 347   ------------------------------------------------------------------------------------------------------------------  Chemistries  Recent Labs  Lab 05/14/17 1929 05/16/17 0639  NA 138 137  K 4.3 4.6  CL 104 103  CO2 26 27  GLUCOSE 287* 173*  BUN 21* 15  CREATININE 1.45* 1.29*  CALCIUM 9.1 8.8*  MG 1.7  --   AST 15  --   ALT 13*  --   ALKPHOS 120  --   BILITOT 0.5  --    ------------------------------------------------------------------------------------------------------------------  Cardiac Enzymes No results for input(s): TROPONINI in the last 168 hours. ------------------------------------------------------------------------------------------------------------------  RADIOLOGY:  No results found.  EKG:   Orders placed or performed in visit on 11/22/16  . EKG 12-Lead    ASSESSMENT AND PLAN:    Left great toe infection and osteomyelitis with leukocytosis.  Status post amputation of the left great toe Pain management as needed vancomycin and Zosyn, follow-up CBC.  Amputation/wound debridement  per Dr. Cleda Mccreedy on January 15 dressing change tomorrow and okay to discharge patient from, podiatry standpoint Hold aspirin and Plavix today and tomorrow for surgery. Wound culture with no growth so far  PVD.  Vascular surgery is planning for angiogram on Thursday  Left axillary swelling--superficial and mobile - seems to be lipoma does not seem to be infected, PCP to monitor closely Patient has to get mammogram soon after discharge she did not  has any mammograms done in the past couple of years,  Diabetes.    Uncontrolled Needs  outpatient follow-up with endocrinologist  sliding scale,  Lantus 60 units twice daily after surgery once blood sugars are stable Currently patient is getting Lantus 30 units twice a day and NovoLog 3 units 3 times daily Continue home dose Neurontin  Diabetes neuropathy.  Pain control.  Neurontin  Hypertension.  Continue home hypertension medication.  Tobacco abuse.  Smoking cessation was counseled for 4 minutes.  Nicotine patch  PT is recommending home health PT      All the records are reviewed and case discussed with Care Management/Social Workerr. Management plans discussed with the patient, family and they are in agreement.  CODE STATUS: fc   TOTAL TIME TAKING CARE OF THIS PATIENT: 35  minutes.   POSSIBLE D/C IN 3-4  DAYS, DEPENDING ON CLINICAL CONDITION.  Note: This dictation was prepared with Dragon dictation along with smaller phrase technology. Any transcriptional errors that result from this process are unintentional.   Nicholes Mango M.D on 05/16/2017 at 3:39 PM  Between 7am to 6pm - Pager - [phone] After 6pm go to [email or web address] - password [email or web address] [facility]  [place] [facility]  Office  [phone]  CC: Primary care physician; Donnie Coffin, MD

## 2017-05-16 NOTE — Progress Notes (Signed)
1 Day Post-Op   Subjective/Chief Complaint: Patient seen.  No pain with her foot but still significant pain in the leg.   Objective: Vital signs in last 24 hours: Temp:  [97.3 F (36.3 C)-98.8 F (37.1 C)] 98.1 F (36.7 C) (01/16 0823) Pulse Rate:  [26-90] 84 (01/16 0823) Resp:  [13-22] 18 (01/16 0823) BP: (110-164)/(46-93) 126/89 (01/16 0823) SpO2:  [96 %-100 %] 99 % (01/16 0823) Weight:  [97.5 kg (215 lb)] 97.5 kg (215 lb) (01/15 1246) Last BM Date: 05/14/17  Intake/Output from previous day: 01/15 0701 - 01/16 0700 In: 7494 [P.O.:600; I.V.:1175] Out: 15 [Blood:15] Intake/Output this shift: Total I/O In: 1730 [P.O.:480; I.V.:1200; IV Piggyback:50] Out: -   Bandage on the left foot is dry and intact.  Lab Results:  Recent Labs    05/14/17 1929 05/16/17 0639  WBC 12.2* 10.2  HGB 12.0 11.3*  HCT 37.7 34.9*  PLT 324 347   BMET Recent Labs    05/14/17 1929 05/16/17 0639  NA 138 137  K 4.3 4.6  CL 104 103  CO2 26 27  GLUCOSE 287* 173*  BUN 21* 15  CREATININE 1.45* 1.29*  CALCIUM 9.1 8.8*   PT/INR Recent Labs    05/14/17 1929  LABPROT 13.4  INR 1.03   ABG No results for input(s): PHART, HCO3 in the last 72 hours.  Invalid input(s): PCO2, PO2  Studies/Results: No results found.  Anti-infectives: Anti-infectives (From admission, onward)   Start     Dose/Rate Route Frequency Ordered Stop   05/15/17 0300  vancomycin (VANCOCIN) IVPB 1000 mg/200 mL premix     1,000 mg 200 mL/hr over 60 Minutes Intravenous Every 18 hours 05/14/17 2021     05/14/17 2100  piperacillin-tazobactam (ZOSYN) IVPB 3.375 g     3.375 g 12.5 mL/hr over 240 Minutes Intravenous Every 8 hours 05/14/17 2021     05/14/17 2100  vancomycin (VANCOCIN) IVPB 1000 mg/200 mL premix     1,000 mg 200 mL/hr over 60 Minutes Intravenous  Once 05/14/17 2021 05/14/17 2227      Assessment/Plan: s/p Procedure(s): AMPUTATION LEFT GREAT TOE (Left) Stable status post amputation left great toe.    Plan: Dressing left intact.  Plan for dressing change tomorrow.  From a foot standpoint hopefully she should be stable for discharge per protocol at any point after her dressing change tomorrow  LOS: 2 days    [name] 05/16/2017

## 2017-05-16 NOTE — Progress Notes (Addendum)
Inpatient Diabetes Program Recommendations  AACE/ADA: New Consensus Statement on Inpatient Glycemic Control (2015)  Target Ranges:  Prepandial:   less than 140 mg/dL      Peak postprandial:   less than 180 mg/dL (1-2 hours)      Critically ill patients:  140 - 180 mg/dL   Results for Melinda [name], Melinda [name] (MRN [id number]) as of 05/16/2017 08:33  Ref. Range 05/15/2017 07:37 05/15/2017 11:36 05/15/2017 14:31 05/15/2017 17:17 05/15/2017 21:05 05/16/2017 00:36 05/16/2017 07:40  Glucose-Capillary Latest Ref Range: 65 - 99 mg/dL 267 (H) 142 (H) 108 (H) 149 (H) 289 (H) 192 (H) 164 (H)   Review of Glycemic Control  Diabetes history: DM2 Outpatient Diabetes medications: Lantus 60 units BID Current orders for Inpatient glycemic control: Lantus 30 units BID, Novolog 0-15 units TID with meals, Novolog 0-5 units QHS  Inpatient Diabetes Program Recommendations: Insulin-Meal Coverage: Please consider ordering Novolog 3 units TID with meals for meal coverage if patient eats at least 50% of meals. HgbA1C: A1C 11.7% on 05/14/17 indicating an average glucose of 289 mg/dl over the past 2-3 months. Patient will need adjustments with outpatient DM medications and follow up with PCP or Endocrinologist.  Will plan to talk with patient today.   Addendum 05/16/17@12 :10-Spoke with patient about diabetes and home regimen for diabetes control. Patient reports that she is followed by PCP for diabetes management and currently she takes Lantus 60 units BID as an outpatient for diabetes control. Inquired about consistently taking Lantus and patient admits that at times she has gone without her Lantus due to cost of medication as well as transportation.  Patient states that she did change insurance carriers which became effective May 01, 2017 so her coverage is much better and she is able to get her medications for free or very low cost. Patient reports that her Lantus will cost her $8.35 per refill with the new insurance she has in place.  Patient has not been checking her glucose consistently because of cost of test strips. Patient also expressed frustration with not being able to get the things she needed for DM control with her last insurance carrier.  Patient states that her PCP is suppose to be sending a prescription for a new glucometer and testing supplies to her new mail order pharmacy. Would recommend attending [facility] provide Rx for new glucometer and testing supplies at time of discharge as well.  Inquired about prior A1C and patient reports that her last A1C was in the 11% range. Discussed A1C results (11.7% on 05/14/17) and explained that her current A1C indicates an average glucose of 289 mg/dl over the past 2-3 months. Discussed glucose and A1C goals. Discussed importance of checking CBGs and maintaining good CBG control to prevent long-term and short-term complications. Explained how hyperglycemia leads to damage within blood vessels which lead to the common complications seen with uncontrolled diabetes. Stressed to the patient the importance of improving glycemic control to prevent further complications from uncontrolled diabetes especially given surgery on 05/15/17. Discussed impact of nutrition, exercise, stress, sickness, and medications on diabetes control. Patient reports that due to numbness and neuropathy in her feet and legs her activity is limited.  Encouraged to try to do exercises that perhaps she is able to do while sitting in a chair. Encouraged patient to check her glucose 3-4 times per day (before meals and at bedtime) and to keep a log book of glucose readings and insulin taken which she will need to take to doctor appointments. Explained how her  PCP could use the log book to continue to make insulin adjustments if needed. Patient reports that she does not anticipate any issues with getting everything she needs for DM control now that she has a new insurance carrier. It is apparent that patient wants to do well but has had  issues in the past with being able to obtain needed supplies and medications.  Patient verbalized understanding of information discussed and she states that she has no further questions at this time related to diabetes.  Thanks, Barnie Alderman, RN, MSN, CDE Diabetes Coordinator Inpatient Diabetes Program [phone] (Team Pager from 8am to 5pm)

## 2017-05-16 NOTE — Evaluation (Signed)
Physical Therapy Evaluation Patient Details Name: Melinda [name] MRN: [id number] DOB: [birth year] Today's Date: 05/16/2017   History of Present Illness  Pt admitted s/p toe amputation of L foot.  PMH includes anxiety, DM, CAD, MI, Htn, IDDM, ischemic cardiomyopathy, OA of knee, obesity, OA, peripheral neuropathy, osteomyelitis.  Clinical Impression  Pt is a 62 year old female who lives in a one story home alone.  She was previously mod independent with mobility, though limited by peripheral neuropathy, and is familiar with the use of a RW.  Pt demonstrated knowledge of WB status WBAT while maintaining wt on heel and wearing surgical shoe, which pt had due to previous toe amputations.  Pt was able to ambulate in room 30 ft with use of RW and no report of pain increase.  She was able to perform bed mobility with use of bed rail and STS transfer with RW with supervision.  Pt demonstrated strength overall WNL and decreased sensation in BLE.  Pt balance is mod I due to WB status.  Pt will continue to benefit from skilled PT with focus on functional mobility, LE strength and ROM and pain management.    Follow Up Recommendations Home health PT    Equipment Recommendations       Recommendations for Other Services       Precautions / Restrictions        Mobility  Bed Mobility Overal bed mobility: Modified Independent             General bed mobility comments: Uses bed rail for all bed mobility  Transfers Overall transfer level: Needs assistance Equipment used: Rolling walker (2 wheeled) Transfers: Sit to/from Stand Sit to Stand: Supervision         General transfer comment: Pt able to perform STS with unilateral use of UE for support and proper use of RW.  PT discussed importance of wt shift and WB status of L LE.  Ambulation/Gait Ambulation/Gait assistance: Supervision Ambulation Distance (Feet): 30 Feet Assistive device: Rolling walker (2 wheeled)     Gait velocity  interpretation: at or above normal speed for age/gender General Gait Details: Pt able to ambulate with RW for support and maintaining WB status.  Demonstrated good UE strength for support and step to gait pattern throughout ambulation.  Pt reoprted no increase in pain or fatigue.  Stairs            Wheelchair Mobility    Modified Rankin (Stroke Patients Only)       Balance Overall balance assessment: Modified Independent                                           Pertinent Vitals/Pain Pain Assessment: Faces Faces Pain Scale: Hurts a little bit Pain Location: Pt reports feeling discomfort but not necessarily pain. Pain Intervention(s): Monitored during session    Home Living Family/patient expects to be discharged to:: Private residence Living Arrangements: Alone Available Help at Discharge: Family(Brother) Type of Home: House Home Access: Stairs to enter Entrance Stairs-Rails: Can reach both Entrance Stairs-Number of Steps: 5-6 Home Layout: One level Home Equipment: Walker - 2 wheels Additional Comments: Pt states that she has a RW at home but does not use it.    Prior Function Level of Independence: Independent               Hand Dominance  Extremity/Trunk Assessment   Upper Extremity Assessment Upper Extremity Assessment: Overall WFL for tasks assessed    Lower Extremity Assessment Lower Extremity Assessment: Overall WFL for tasks assessed    Cervical / Trunk Assessment Cervical / Trunk Assessment: Normal  Communication   Communication: No difficulties  Cognition Arousal/Alertness: Awake/alert Behavior During Therapy: WFL for tasks assessed/performed Overall Cognitive Status: Within Functional Limits for tasks assessed                                        General Comments      Exercises     Assessment/Plan    PT Assessment Patient needs continued PT services  PT Problem List Decreased  strength;Decreased range of motion;Decreased activity tolerance;Decreased balance;Decreased mobility;Decreased safety awareness;Impaired sensation;Pain;Obesity       PT Treatment Interventions DME instruction;Gait training;Stair training;Functional mobility training;Therapeutic activities;Therapeutic exercise;Balance training;Patient/family education    PT Goals (Current goals can be found in the Care Plan section)  Acute Rehab PT Goals Patient Stated Goal: To return home and do home health PT. PT Goal Formulation: With patient Time For Goal Achievement: 05/30/17 Potential to Achieve Goals: Good    Frequency BID   Barriers to discharge        Co-evaluation               AM-PAC PT "6 Clicks" Daily Activity  Outcome Measure Difficulty turning over in bed (including adjusting bedclothes, sheets and blankets)?: A Little Difficulty moving from lying on back to sitting on the side of the bed? : A Little Difficulty sitting down on and standing up from a chair with arms (e.g., wheelchair, bedside commode, etc,.)?: A Little Help needed moving to and from a bed to chair (including a wheelchair)?: A Little Help needed walking in hospital room?: A Little Help needed climbing 3-5 steps with a railing? : A Little 6 Click Score: 18    End of Session Equipment Utilized During Treatment: Gait belt Activity Tolerance: Patient tolerated treatment well Patient left: in bed;with nursing/sitter in room;with family/visitor present;with call bell/phone within reach Nurse Communication: Mobility status PT Visit Diagnosis: Unsteadiness on feet (R26.81);Pain;Muscle weakness (generalized) (M62.81) Pain - Right/Left: Left(Bilateral) Pain - part of body: Ankle and joints of foot;Leg    Time: 1320-1340 PT Time Calculation (min) (ACUTE ONLY): 20 min   Charges:   PT Evaluation $PT Eval Low Complexity: 1 Low     PT G Codes:   PT G-Codes **NOT FOR INPATIENT CLASS** Functional Assessment Tool  Used: AM-PAC 6 Clicks Basic Mobility Functional Limitation: Mobility: Walking and moving around Mobility: Walking and Moving Around Current Status (P5465): At least 40 percent but less than 60 percent impaired, limited or restricted Mobility: Walking and Moving Around Goal Status [phone]): At least 1 percent but less than 20 percent impaired, limited or restricted    Roxanne [name], PT, DPT   Roxanne [name] 05/16/2017, 1:52 PM

## 2017-05-17 ENCOUNTER — Ambulatory Visit: Admission: RE | Admit: 2017-05-17 | Payer: Medicare Other | Source: Ambulatory Visit | Admitting: Vascular Surgery

## 2017-05-17 ENCOUNTER — Encounter
Admission: AD | Disposition: A | Discharge status: Home / Self Care | Payer: Self-pay | Source: Ambulatory Visit | Attending: Internal Medicine

## 2017-05-17 ENCOUNTER — Encounter (INDEPENDENT_AMBULATORY_CARE_PROVIDER_SITE_OTHER): Payer: Self-pay

## 2017-05-17 LAB — BASIC METABOLIC PANEL
Anion gap: 8 (ref 5–15)
BUN: 19 mg/dL (ref 6–20)
CO2: 28 mmol/L (ref 22–32)
Calcium: 8.9 mg/dL (ref 8.9–10.3)
Chloride: 107 mmol/L (ref 101–111)
Creatinine, Ser: 1.29 mg/dL — ABNORMAL HIGH (ref 0.44–1.00)
GFR calc Af Amer: 50 mL/min — ABNORMAL LOW (ref >60)
GFR calc non Af Amer: 43 mL/min — ABNORMAL LOW (ref >60)
Glucose, Bld: 160 mg/dL — ABNORMAL HIGH (ref 65–99)
Potassium: 4.4 mmol/L (ref 3.5–5.1)
Sodium: 143 mmol/L (ref 135–145)

## 2017-05-17 LAB — GLUCOSE, CAPILLARY
Glucose-Capillary: 166 mg/dL — ABNORMAL HIGH (ref 65–99)
Glucose-Capillary: 193 mg/dL — ABNORMAL HIGH (ref 65–99)

## 2017-05-17 LAB — VANCOMYCIN, TROUGH: Vancomycin Tr: 12 ug/mL — ABNORMAL LOW (ref 15–20)

## 2017-05-17 SURGERY — LOWER EXTREMITY ANGIOGRAPHY
Anesthesia: Moderate Sedation | Laterality: Left

## 2017-05-17 MED ORDER — ASPIRIN 81 MG PO TBEC: 81.0000 mg | DELAYED_RELEASE_TABLET | Freq: Every day | ORAL | 0 refills | Status: DC

## 2017-05-17 MED ORDER — INSULIN GLARGINE 100 UNIT/ML ~~LOC~~ SOLN: 40.0000 [IU] | Freq: Two times a day (BID) | SUBCUTANEOUS | 1 refills | Status: DC

## 2017-05-17 MED ORDER — ACETAMINOPHEN 325 MG PO TABS: 650.0000 mg | ORAL_TABLET | Freq: Four times a day (QID) | ORAL | 0 refills | Status: DC | PRN

## 2017-05-17 MED ORDER — VANCOMYCIN HCL 10 G IV SOLR: 1250.0000 mg | INTRAVENOUS | Status: DC

## 2017-05-17 MED ORDER — DOXYCYCLINE HYCLATE 100 MG PO TABS
100.0000 mg | ORAL_TABLET | Freq: Two times a day (BID) | ORAL | Status: DC
  Administered 2017-05-17: 100 mg via ORAL
  Filled 2017-05-17: qty 1

## 2017-05-17 MED ORDER — DOXYCYCLINE HYCLATE 100 MG PO TABS: 100.0000 mg | ORAL_TABLET | Freq: Two times a day (BID) | ORAL | 0 refills | Status: DC

## 2017-05-17 MED ORDER — OXYCODONE HCL 5 MG PO TABS: 5.0000 mg | ORAL_TABLET | Freq: Three times a day (TID) | ORAL | 0 refills | Status: DC | PRN

## 2017-05-17 MED ORDER — INSULIN ASPART 100 UNIT/ML ~~LOC~~ SOLN: 3.0000 [IU] | Freq: Three times a day (TID) | SUBCUTANEOUS | 0 refills | Status: DC

## 2017-05-17 MED ORDER — CLOPIDOGREL BISULFATE 75 MG PO TABS
75.0000 mg | ORAL_TABLET | Freq: Every day | ORAL | 0 refills | Status: DC
Start: 2017-05-17 — End: 2019-06-26

## 2017-05-17 NOTE — Progress Notes (Signed)
Pharmacy Antibiotic Note  Melinda [name] is a 62 y.o. female admitted on 05/14/2017 with osteomyelitis.  Pharmacy has been consulted for vancomycin and Zosyn dosing.  Plan: 05/17/17 0901 vancomycin trough 12 mcg/mL. Renal function looks stable and dosing times look appropriate. Level was drawn before dose was given. Recalculated Ke 0.052 hr-1, T1/2 13.4 hr. Increase to vancomycin 1.25 gm IV Q18H, predicted trough 15 mcg/mL. Pharmacy will continue to follow and adjust as needed to maintain trough 15 to 20 mcg/ml.   Height: 5\' 8"  (172.7 cm) Weight: 215 lb (97.5 kg) IBW/kg (Calculated) : 63.9  Temp (24hrs), Avg:98.5 F (36.9 C), Min:98 F (36.7 C), Max:98.9 F (37.2 C)  Recent Labs  Lab 05/14/17 1929 05/16/17 0639 05/17/17 0448 05/17/17 0901  WBC 12.2* 10.2  --   --   CREATININE 1.45* 1.29* 1.29*  --   VANCOTROUGH  --   --   --  12*    Estimated Creatinine Clearance: 54.5 mL/min (A) (by C-G formula based on SCr of 1.29 mg/dL (H)).    No Known Allergies  Antimicrobials this admission: vancomycin 1/14 >>  Zosyn 1/14 >>   Dose adjustments this admission:   Microbiology results:  Thank you for allowing pharmacy to be a part of this patient's care.  Laural Benes, Pharm.D., BCPS Clinical Pharmacist 05/17/2017 9:52 AM

## 2017-05-17 NOTE — Progress Notes (Signed)
Post discharge note: Received a callback from Dr. Bunnie Domino office stating that the patient has an angiogram scheduled for next week and they would have someone from their office to call the patient tomorrow to inform her of the appointment.

## 2017-05-17 NOTE — Progress Notes (Addendum)
Inpatient Diabetes Program Recommendations  AACE/ADA: New Consensus Statement on Inpatient Glycemic Control (2015)  Target Ranges:  Prepandial:   less than 140 mg/dL      Peak postprandial:   less than 180 mg/dL (1-2 hours)      Critically ill patients:  140 - 180 mg/dL   Lab Results  Component Value Date   GLUCAP 148 (H) 05/16/2017   HGBA1C 11.7 (H) 05/14/2017    Review of Glycemic Control  Results for KORRINE, SICARD (MRN [id number]) as of 05/17/2017 10:21  Ref. Range 05/16/2017 07:40 05/16/2017 12:11 05/16/2017 17:17 05/16/2017 21:14 05/17/2017 07:45  Glucose-Capillary Latest Ref Range: 65 - 99 mg/dL 164 (H) 243 (H) 251 (H) 148 (H) 166 (H)    Diabetes history:DM2 Outpatient Diabetes medications:Lantus 60 units BID  Current orders for Inpatient glycemic control:Lantus 30 units BID, Novolog 0-15 units TID with meals, Novolog 0-5 units QHS, Novolog 3 units tid  Inpatient Diabetes Program Recommendations: Agree with current medications for blood sugar management.     A1C 11.7% on 05/14/17 indicating an average glucose of 289 mg/dl over the past 2-3 months. Patient will need adjustments with outpatient DM medications and follow up with PCP or Endocrinologist.  Would recommend [facility] provide Rx for new glucometer and testing supplies at time of discharge as well.    Gentry Fitz, RN, BA, MHA, CDE Diabetes Coordinator Inpatient Diabetes Program  [phone] (Team Pager) [phone] ([place]) 05/17/2017 10:23 AM

## 2017-05-17 NOTE — Care Management (Signed)
MEt with patient again at bedside. Readdressed what PT could provide for her at home. She refused an home health physical therapy at this time. Seh understands she can call her PCP in the fututre

## 2017-05-17 NOTE — Anesthesia Postprocedure Evaluation (Signed)
Anesthesia Post Note  Patient: Melinda [name]  Procedure(s) Performed: AMPUTATION LEFT GREAT TOE (Left )  Patient location during evaluation: PACU Anesthesia Type: General Level of consciousness: awake and alert Pain management: pain level controlled Vital Signs Assessment: post-procedure vital signs reviewed and stable Respiratory status: spontaneous breathing, nonlabored ventilation, respiratory function stable and patient connected to nasal cannula oxygen Cardiovascular status: blood pressure returned to baseline and stable Postop Assessment: no apparent nausea or vomiting Anesthetic complications: no     Last Vitals:  Vitals:   05/17/17 0919 05/17/17 0927  BP: (!) 98/58 (!) 98/58  Pulse:  79  Resp:    Temp:  37.1 C  SpO2:  98%    Last Pain:  Vitals:   05/17/17 0927  TempSrc: Oral  PainSc:                  Martha Clan

## 2017-05-17 NOTE — Progress Notes (Signed)
2 Days Post-Op   Subjective/Chief Complaint: Patient seen.  No complaints of pain in the foot.  States she refused her angiogram today because she needed to talk with Dr. Lucky Cowboy.  Was told that she would be discharged after we changed her dressing.   Objective: Vital signs in last 24 hours: Temp:  [98 F (36.7 C)-98.9 F (37.2 C)] 98.7 F (37.1 C) (01/17 0927) Pulse Rate:  [79-93] 83 (01/17 1147) Resp:  [18-19] 19 (01/17 0504) BP: (98-149)/(46-98) 122/98 (01/17 1147) SpO2:  [94 %-98 %] 98 % (01/17 0927) Last BM Date: 05/17/17  Intake/Output from previous day: 01/16 0701 - 01/17 0700 In: 3777.5 [P.O.:840; I.V.:2537.5; IV Piggyback:400] Out: -  Intake/Output this shift: Total I/O In: 612.5 [P.O.:150; I.V.:462.5] Out: -   Minimal bleeding on the bandaging on the left foot.  Upon removal the incision is well coapted with viable skin edges at this point.  No purulence.  Lab Results:  Recent Labs    05/14/17 1929 05/16/17 0639  WBC 12.2* 10.2  HGB 12.0 11.3*  HCT 37.7 34.9*  PLT 324 347   BMET Recent Labs    05/16/17 0639 05/17/17 0448  NA 137 143  K 4.6 4.4  CL 103 107  CO2 27 28  GLUCOSE 173* 160*  BUN 15 19  CREATININE 1.29* 1.29*  CALCIUM 8.8* 8.9   PT/INR Recent Labs    05/14/17 1929  LABPROT 13.4  INR 1.03   ABG No results for input(s): PHART, HCO3 in the last 72 hours.  Invalid input(s): PCO2, PO2  Studies/Results: No results found.  Anti-infectives: Anti-infectives (From admission, onward)   Start     Dose/Rate Route Frequency Ordered Stop   05/18/17 0300  vancomycin (VANCOCIN) 1,250 mg in sodium chloride 0.9 % 250 mL IVPB     1,250 mg 166.7 mL/hr over 90 Minutes Intravenous Every 18 hours 05/17/17 0951     05/17/17 1130  doxycycline (VIBRA-TABS) tablet 100 mg     100 mg Oral Every 12 hours 05/17/17 1113     05/17/17 0000  doxycycline (VIBRA-TABS) 100 MG tablet     100 mg Oral Every 12 hours 05/17/17 1113     05/15/17 0300  vancomycin  (VANCOCIN) IVPB 1000 mg/200 mL premix  Status:  Discontinued     1,000 mg 200 mL/hr over 60 Minutes Intravenous Every 18 hours 05/14/17 2021 05/17/17 0951   05/14/17 2100  piperacillin-tazobactam (ZOSYN) IVPB 3.375 g     3.375 g 12.5 mL/hr over 240 Minutes Intravenous Every 8 hours 05/14/17 2021     05/14/17 2100  vancomycin (VANCOCIN) IVPB 1000 mg/200 mL premix     1,000 mg 200 mL/hr over 60 Minutes Intravenous  Once 05/14/17 2021 05/14/17 2227      Assessment/Plan: s/p Procedure(s): AMPUTATION LEFT GREAT TOE (Left) Assessment: Stable status post amputation left great toe.   Plan: Betadine and a dry sterile bandage applied to the left great toe and foot.  Discussed with the patient that she needs to have the angiogram to try to maximize her circulation.  Discussed that she is at a much higher risk for loss of her lower extremity without this.  I did speak with Dr. Lucky Cowboy who stated that this could not be rescheduled for today.  It appears that he does have some time this coming Wednesday and it can be performed as an outpatient.  Patient is stable for discharge as far as her amputation on the left great toe with some oral antibiotics.  Was instructed to keep the bandage on the left foot clean, dry, and not to remove.  Weightbearing only in the surgical shoe and try to walk on the heel.  I will follow-up with her next Tuesday outpatient.  We will have the nurse to schedule her Angie O with Dr. Lucky Cowboy for next Wednesday  LOS: 3 days    [name] 05/17/2017

## 2017-05-17 NOTE — Progress Notes (Signed)
Physical Therapy Treatment Patient Details Name: Melinda [name] MRN: [id number] DOB: 04/29/1956 Today's Date: 05/17/2017    History of Present Illness Pt admitted s/p toe amputation of L foot.  PMH includes anxiety, DM, CAD, MI, Htn, IDDM, ischemic cardiomyopathy, OA of knee, obesity, OA, peripheral neuropathy, osteomyelitis.    PT Comments    Pt in chair, ready for session.  Pt stood with ease and was able to ambulate 100' with walker and one standing rest break.  Surgical shoe donned.  Pt able to voice WB status and need to use surgical shoe.  Pt initially declined to use walker stating it is her 3rd amputation and she has not used one at home and seemed resistant to using it at home this time.  Education provided on importance of keeping weight on heel only with surgical shoe and that it can be difficult and affect balance and increase fatigue without AD.  She voiced understanding and agreed to use it during session.  Encouraged her to continue to do so at home.  Overall gait is steady without LOB.     Follow Up Recommendations  Home health PT     Equipment Recommendations  Rolling walker with 5" wheels    Recommendations for Other Services       Precautions / Restrictions Precautions Precautions: Fall Restrictions Weight Bearing Restrictions: Yes LLE Weight Bearing: Weight bearing as tolerated    Mobility  Bed Mobility               General bed mobility comments: in recliner  Transfers   Equipment used: Rolling walker (2 wheeled) Transfers: Sit to/from Stand Sit to Stand: Modified independent (Device/Increase time)            Ambulation/Gait Ambulation/Gait assistance: Modified independent (Device/Increase time) Ambulation Distance (Feet): 100 Feet   Gait Pattern/deviations: Step-to pattern;Decreased step length - right;Decreased step length - left   Gait velocity interpretation: Below normal speed for age/gender     Stairs             Wheelchair Mobility    Modified Rankin (Stroke Patients Only)       Balance Overall balance assessment: Modified Independent                                          Cognition Arousal/Alertness: Awake/alert Behavior During Therapy: WFL for tasks assessed/performed Overall Cognitive Status: Within Functional Limits for tasks assessed                                        Exercises      General Comments        Pertinent Vitals/Pain Pain Assessment: 0-10 Pain Score: 2  Pain Location: Pt reports feeling discomfort but not necessarily pain. Pain Intervention(s): Monitored during session    Home Living                      Prior Function            PT Goals (current goals can now be found in the care plan section) Progress towards PT goals: Progressing toward goals    Frequency    BID      PT Plan Current plan remains appropriate    Co-evaluation  AM-PAC PT "6 Clicks" Daily Activity  Outcome Measure  Difficulty turning over in bed (including adjusting bedclothes, sheets and blankets)?: A Little Difficulty moving from lying on back to sitting on the side of the bed? : A Little Difficulty sitting down on and standing up from a chair with arms (e.g., wheelchair, bedside commode, etc,.)?: A Little Help needed moving to and from a bed to chair (including a wheelchair)?: A Little Help needed walking in hospital room?: A Little Help needed climbing 3-5 steps with a railing? : A Little 6 Click Score: 18    End of Session Equipment Utilized During Treatment: Gait belt Activity Tolerance: Patient tolerated treatment well Patient left: in chair;with chair alarm set;with call bell/phone within reach Nurse Communication: Mobility status Pain - Right/Left: Left Pain - part of body: Ankle and joints of foot;Leg     Time: 5329-9242 PT Time Calculation (min) (ACUTE ONLY): 11 min  Charges:  $Gait  Training: 8-22 mins                    G Codes:       Chesley Noon, PTA 05/17/17, 10:16 AM

## 2017-05-18 LAB — SURGICAL PATHOLOGY

## 2017-05-18 NOTE — Discharge Summary (Signed)
Melinda [name] at [facility] NAME: Melinda [name]    MR#:  [id number]  DATE OF BIRTH:  09-Nov-1955  DATE OF ADMISSION:  05/14/2017 ADMITTING PHYSICIAN: Demetrios Loll, MD  DATE OF DISCHARGE: 05/17/2017  2:36 PM  PRIMARY CARE PHYSICIAN: Donnie Coffin, MD    ADMISSION DIAGNOSIS:  Gangrene lt great toe osteomyelitis  DISCHARGE DIAGNOSIS:  Active Problems:   Gangrene of toe of left foot ([place])   SECONDARY DIAGNOSIS:   Past Medical History:  Diagnosis Date  . Anxiety   . Coronary artery disease    a. NSTEMI 06/2014; b. 4 vessel CABG 06/08/2014 LIMA to LAD, SVG to Diagonal, SVG to OM, & SVG to PDA  . Diabetic neuropathy ([place])   . GERD (gastroesophageal reflux disease)   . Heart attack ([place])   . HTN (hypertension)   . IDDM (insulin dependent diabetes mellitus) ([place])   . Ischemic cardiomyopathy    a. echo 06/04/14 EF 40-45%, HK of entire inferolateral and inferior myocardium c.w infarct of RCA/LCx, GR2DD, mild MR  . OA (osteoarthritis) of knee   . Obesity   . Osteoarthritis   . Osteomyelitis ([place])   . Peripheral neuropathy   . Peripheral vascular disease ([place])   . Tobacco abuse     HOSPITAL COURSE:   1.  Osteomyelitis of the left great toe, leukocytosis.  Patient is status post amputation by Dr. Cleda Mccreedy podiatry.  The patient was set up for an angiogram for peripheral vascular disease the day of admission but then the patient refused.  Will prescribe doxycycline as outpatient.  Home health set up for dressing changes.  Follow-up with Dr. Caryl Comes 1 week. 2.  Peripheral vascular disease.  Patient refused angiogram on the day of discharge.  Follow-up with Dr. Lucky Cowboy as outpatient.  Continue aspirin and Plavix. 3.  Left axillary swelling.  Examined by my associate Dr. Margaretmary Eddy.  She was advised to get a mammogram as outpatient. 4.  Type 2 diabetes mellitus.  Can go back on Lantus 40 units twice a day.  Added short acting insulin.  I also wrote scripts for glucometer and  test strips and lancets and alcohol swabs. 5.  Diabetic neuropathy on gabapentin 6.  Essential hypertension on Lasix and lisinopril 7.  Tobacco abuse 8.  Physical therapy recommended home health PT 9.  Hyperlipidemia unspecified on atorvastatin 10.  Chronic kidney disease stage III  DISCHARGE CONDITIONS:   Satisfactory  CONSULTS OBTAINED:  Treatment Team:  Sharlotte Alamo, DPM  DRUG ALLERGIES:  No Known Allergies  DISCHARGE MEDICATIONS:   Allergies as of 05/17/2017   No Known Allergies     Medication List    STOP taking these medications   cephALEXin 500 MG capsule Commonly known as:  KEFLEX     TAKE these medications   acetaminophen 325 MG tablet Commonly known as:  TYLENOL Take 2 tablets (650 mg total) by mouth every 6 (six) hours as needed for mild pain (or Fever >/= 101).   aspirin 81 MG EC tablet Take 1 tablet (81 mg total) by mouth daily.   atorvastatin 80 MG tablet Commonly known as:  LIPITOR Take 1 tablet (80 mg total) by mouth daily at 6 PM.   clopidogrel 75 MG tablet Commonly known as:  PLAVIX Take 1 tablet (75 mg total) by mouth daily.   doxycycline 100 MG tablet Commonly known as:  VIBRA-TABS Take 1 tablet (100 mg total) by mouth every 12 (twelve) hours.   furosemide 20  MG tablet Commonly known as:  LASIX Take 1 tablet (20 mg total) by mouth daily as needed.   gabapentin 400 MG capsule Commonly known as:  NEURONTIN Take 1,200 mg by mouth 3 (three) times daily.   insulin aspart 100 UNIT/ML injection Commonly known as:  novoLOG Inject 3 Units into the skin 3 (three) times daily with meals.   insulin glargine 100 UNIT/ML injection Commonly known as:  LANTUS Inject 0.4 mLs (40 Units total) into the skin 2 (two) times daily. What changed:  how much to take   Insulin Syringe-Needle U-100 30G X 1/2" 1 ML Misc Commonly known as:  B-D INS SYR ULTRAFINE 1CC/30G Inject sixty units of long-acting insulin twice a day   lisinopril 40 MG  tablet Commonly known as:  PRINIVIL,ZESTRIL Take 1 tablet (40 mg total) by mouth daily.   metoprolol tartrate 25 MG tablet Commonly known as:  LOPRESSOR Take 1 tablet (25 mg total) by mouth 2 (two) times daily.   oxybutynin 5 MG 24 hr tablet Commonly known as:  DITROPAN-XL Take 1 tablet (5 mg total) by mouth 2 (two) times daily.   oxyCODONE 5 MG immediate release tablet Commonly known as:  ROXICODONE Take 1 tablet (5 mg total) by mouth every 8 (eight) hours as needed for moderate pain or severe pain.   potassium chloride SA 20 MEQ tablet Commonly known as:  K-DUR,KLOR-CON Take 1 tablet (20 mEq total) by mouth daily as needed.        DISCHARGE INSTRUCTIONS:   Follow-up 1 week Dr. Cleda Mccreedy podiatry Follow-up PMD 1 week Follow-up vascular surgery for angiogram as outpatient  If you experience worsening of your admission symptoms, develop shortness of breath, life threatening emergency, suicidal or homicidal thoughts you must seek medical attention immediately by calling 911 or calling your MD immediately  if symptoms less severe.  You Must read complete instructions/literature along with all the possible adverse reactions/side effects for all the Medicines you take and that have been prescribed to you. Take any new Medicines after you have completely understood and accept all the possible adverse reactions/side effects.   Please note  You were cared for by a hospitalist during your hospital stay. If you have any questions about your discharge medications or the care you received while you were in the hospital after you are discharged, you can call the unit and asked to speak with the hospitalist on call if the hospitalist that took care of you is not available. Once you are discharged, your primary care physician will handle any further medical issues. Please note that NO REFILLS for any discharge medications will be authorized once you are discharged, as it is imperative that you  return to your primary care physician (or establish a relationship with a primary care physician if you do not have one) for your aftercare needs so that they can reassess your need for medications and monitor your lab values.    Today   CHIEF COMPLAINT:  No chief complaint on file.   HISTORY OF PRESENT ILLNESS:  Melinda [name]  is a 62 y.o. female brought in with infection of the toe   VITAL SIGNS:  Blood pressure (!) 122/98, pulse 83, temperature 98.7 F (37.1 C), temperature source Oral, resp. rate 19, height 5\' 8"  (1.727 m), weight 97.5 kg (215 lb), SpO2 98 %.    PHYSICAL EXAMINATION:  GENERAL:  62 y.o.-year-old patient lying in the bed with no acute distress.  EYES: Pupils equal, round, reactive to light  and accommodation. No scleral icterus. Extraocular muscles intact.  HEENT: Head atraumatic, normocephalic. Oropharynx and nasopharynx clear.  NECK:  Supple, no jugular venous distention. No thyroid enlargement, no tenderness.  LUNGS: Normal breath sounds bilaterally, no wheezing, rales,rhonchi or crepitation. No use of accessory muscles of respiration.  CARDIOVASCULAR: S1, S2 normal. No murmurs, rubs, or gallops.  ABDOMEN: Soft, non-tender, non-distended. Bowel sounds present. No organomegaly or mass.  EXTREMITIES: No pedal edema, cyanosis, or clubbing.  NEUROLOGIC: Cranial nerves II through XII are intact. Muscle strength 5/5 in all extremities. Sensation intact. Gait not checked.  PSYCHIATRIC: The patient is alert and oriented x 3.  SKIN: Left foot covered with a large bandage  DATA REVIEW:   CBC Recent Labs  Lab 05/16/17 0639  WBC 10.2  HGB 11.3*  HCT 34.9*  PLT 347    Chemistries  Recent Labs  Lab 05/14/17 1929  05/17/17 0448  NA 138   < > 143  K 4.3   < > 4.4  CL 104   < > 107  CO2 26   < > 28  GLUCOSE 287*   < > 160*  BUN 21*   < > 19  CREATININE 1.45*   < > 1.29*  CALCIUM 9.1   < > 8.9  MG 1.7  --   --   AST 15  --   --   ALT 13*  --   --    ALKPHOS 120  --   --   BILITOT 0.5  --   --    < > = values in this interval not displayed.     Microbiology Results  Results for orders placed or performed during the hospital encounter of 05/14/17  Surgical PCR screen     Status: None   Collection Time: 05/15/17  5:31 AM  Result Value Ref Range Status   MRSA, PCR NEGATIVE NEGATIVE Final   Staphylococcus aureus NEGATIVE NEGATIVE Final    Comment: (NOTE) The Xpert SA Assay (FDA approved for NASAL specimens in patients 26 years of age and older), is one component of a comprehensive surveillance program. It is not intended to diagnose infection nor to guide or monitor treatment. Performed at [facility], [address], [place], [place] [address]   Aerobic/Anaerobic Culture (surgical/deep wound)     Status: None (Preliminary result)   Collection Time: 05/15/17  1:51 PM  Result Value Ref Range Status   Specimen Description   Final    BONE Performed at [facility], [address] [place], [place] [address]    Special Requests   Final    LEFT GREAT TOE BONE Performed at [facility], [address] [place], [place] [address]    Gram Stain   Final    FEW WBC PRESENT, PREDOMINANTLY MONONUCLEAR NO ORGANISMS SEEN    Culture   Final    NO GROWTH 2 DAYS NO ANAEROBES ISOLATED; CULTURE IN PROGRESS FOR 5 DAYS Performed at [facility] [address] [place],  [address]    Report Status PENDING  Incomplete       Management plans discussed with the patient, family and they are in agreement.  CODE STATUS:  Code Status History    Date Active Date Inactive Code Status Order ID Comments User Context   05/14/2017 17:11 05/17/2017 17:36 Full Code [id number]  Demetrios Loll, MD Inpatient   10/27/2016 09:22 10/27/2016 15:59 Full Code [id number]  Nelva Bush, MD Inpatient   10/21/2015 15:54  10/22/2015 16:21 Full Code [id number]  Demetrios Loll, MD Inpatient   10/02/2015 18:16 10/04/2015 16:54 Full Code  [id number]  Demetrios Loll, MD Inpatient   06/08/2014 12:58 06/13/2014 18:54 Full Code [id number]  Nani Skillern, [name] Inpatient   06/03/2014 17:18 06/08/2014 12:58 Full Code [id number]  Rise Mu, [name] Inpatient      TOTAL TIME TAKING CARE OF THIS PATIENT: 35 minutes.    Loletha Grayer M.D on 05/18/2017 at 7:03 AM  Between 7am to 6pm - Pager - [phone]  After 6pm go to [email or web address] - password [email or web address]  [employer] Office  [phone]  CC: Primary care physician; Donnie Coffin, MD

## 2017-05-20 LAB — AEROBIC/ANAEROBIC CULTURE (SURGICAL/DEEP WOUND)

## 2017-05-20 LAB — AEROBIC/ANAEROBIC CULTURE W GRAM STAIN (SURGICAL/DEEP WOUND)

## 2017-05-21 ENCOUNTER — Other Ambulatory Visit (INDEPENDENT_AMBULATORY_CARE_PROVIDER_SITE_OTHER): Payer: Self-pay | Admitting: Vascular Surgery

## 2017-05-22 MED ORDER — CEFAZOLIN SODIUM-DEXTROSE 2-4 GM/100ML-% IV SOLN
2.0000 g | Freq: Once | INTRAVENOUS | Status: AC
  Administered 2017-05-23: 2 g via INTRAVENOUS

## 2017-05-23 ENCOUNTER — Encounter: Payer: Self-pay | Admitting: *Deleted

## 2017-05-23 ENCOUNTER — Ambulatory Visit
Admission: RE | Admit: 2017-05-23 | Discharge: 2017-05-23 | Disposition: A | Discharge status: Home / Self Care | Payer: Medicare HMO | Source: Ambulatory Visit | Attending: Vascular Surgery | Admitting: Vascular Surgery

## 2017-05-23 ENCOUNTER — Encounter
Admission: RE | Disposition: A | Discharge status: Home / Self Care | Payer: Self-pay | Source: Ambulatory Visit | Attending: Vascular Surgery

## 2017-05-23 DIAGNOSIS — F17210 Nicotine dependence, cigarettes, uncomplicated: Secondary | ICD-10-CM | POA: Insufficient documentation

## 2017-05-23 DIAGNOSIS — I10 Essential (primary) hypertension: Secondary | ICD-10-CM | POA: Insufficient documentation

## 2017-05-23 DIAGNOSIS — Z833 Family history of diabetes mellitus: Secondary | ICD-10-CM | POA: Insufficient documentation

## 2017-05-23 DIAGNOSIS — Z951 Presence of aortocoronary bypass graft: Secondary | ICD-10-CM | POA: Insufficient documentation

## 2017-05-23 DIAGNOSIS — I429 Cardiomyopathy, unspecified: Secondary | ICD-10-CM | POA: Insufficient documentation

## 2017-05-23 DIAGNOSIS — E1152 Type 2 diabetes mellitus with diabetic peripheral angiopathy with gangrene: Secondary | ICD-10-CM | POA: Insufficient documentation

## 2017-05-23 DIAGNOSIS — Z89421 Acquired absence of other right toe(s): Secondary | ICD-10-CM | POA: Insufficient documentation

## 2017-05-23 DIAGNOSIS — I70268 Atherosclerosis of native arteries of extremities with gangrene, other extremity: Secondary | ICD-10-CM | POA: Diagnosis present

## 2017-05-23 DIAGNOSIS — Z825 Family history of asthma and other chronic lower respiratory diseases: Secondary | ICD-10-CM | POA: Insufficient documentation

## 2017-05-23 DIAGNOSIS — Z89422 Acquired absence of other left toe(s): Secondary | ICD-10-CM | POA: Insufficient documentation

## 2017-05-23 DIAGNOSIS — Z832 Family history of diseases of the blood and blood-forming organs and certain disorders involving the immune mechanism: Secondary | ICD-10-CM | POA: Diagnosis not present

## 2017-05-23 DIAGNOSIS — I96 Gangrene, not elsewhere classified: Secondary | ICD-10-CM

## 2017-05-23 DIAGNOSIS — Z9889 Other specified postprocedural states: Secondary | ICD-10-CM | POA: Diagnosis not present

## 2017-05-23 DIAGNOSIS — Z8249 Family history of ischemic heart disease and other diseases of the circulatory system: Secondary | ICD-10-CM | POA: Insufficient documentation

## 2017-05-23 DIAGNOSIS — M869 Osteomyelitis, unspecified: Secondary | ICD-10-CM | POA: Insufficient documentation

## 2017-05-23 DIAGNOSIS — I70262 Atherosclerosis of native arteries of extremities with gangrene, left leg: Secondary | ICD-10-CM | POA: Diagnosis not present

## 2017-05-23 DIAGNOSIS — I2510 Atherosclerotic heart disease of native coronary artery without angina pectoris: Secondary | ICD-10-CM | POA: Insufficient documentation

## 2017-05-23 DIAGNOSIS — Z955 Presence of coronary angioplasty implant and graft: Secondary | ICD-10-CM | POA: Insufficient documentation

## 2017-05-23 DIAGNOSIS — E1140 Type 2 diabetes mellitus with diabetic neuropathy, unspecified: Secondary | ICD-10-CM | POA: Diagnosis not present

## 2017-05-23 DIAGNOSIS — M179 Osteoarthritis of knee, unspecified: Secondary | ICD-10-CM | POA: Insufficient documentation

## 2017-05-23 HISTORY — PX: LOWER EXTREMITY ANGIOGRAPHY: CATH118251

## 2017-05-23 LAB — CREATININE, SERUM
Creatinine, Ser: 1.43 mg/dL — ABNORMAL HIGH (ref 0.44–1.00)
GFR calc Af Amer: 44 mL/min — ABNORMAL LOW (ref >60)
GFR calc non Af Amer: 38 mL/min — ABNORMAL LOW (ref >60)

## 2017-05-23 LAB — BUN: BUN: 30 mg/dL — ABNORMAL HIGH (ref 6–20)

## 2017-05-23 LAB — GLUCOSE, CAPILLARY: Glucose-Capillary: 129 mg/dL — ABNORMAL HIGH (ref 65–99)

## 2017-05-23 SURGERY — LOWER EXTREMITY ANGIOGRAPHY
Anesthesia: Moderate Sedation | Laterality: Left

## 2017-05-23 MED ORDER — HEPARIN SODIUM (PORCINE) 1000 UNIT/ML IJ SOLN
INTRAMUSCULAR | Status: AC
  Filled 2017-05-23: qty 1

## 2017-05-23 MED ORDER — SODIUM CHLORIDE 0.9 % IV SOLN: INTRAVENOUS | Status: DC

## 2017-05-23 MED ORDER — HEPARIN (PORCINE) IN NACL 2-0.9 UNIT/ML-% IJ SOLN
INTRAMUSCULAR | Status: AC
  Filled 2017-05-23: qty 1000

## 2017-05-23 MED ORDER — HYDRALAZINE HCL 20 MG/ML IJ SOLN: 5.0000 mg | INTRAMUSCULAR | Status: DC | PRN

## 2017-05-23 MED ORDER — HYDROMORPHONE HCL 1 MG/ML IJ SOLN
INTRAMUSCULAR | Status: AC
  Filled 2017-05-23: qty 0.5

## 2017-05-23 MED ORDER — SODIUM CHLORIDE 0.9% FLUSH: 3.0000 mL | INTRAVENOUS | Status: DC | PRN

## 2017-05-23 MED ORDER — HYDROMORPHONE HCL 1 MG/ML IJ SOLN
1.0000 mg | Freq: Once | INTRAMUSCULAR | Status: AC | PRN
Start: 2017-05-23 — End: 2017-05-23
  Administered 2017-05-23: 0.5 mg via INTRAVENOUS

## 2017-05-23 MED ORDER — METHYLPREDNISOLONE SODIUM SUCC 125 MG IJ SOLR: 125.0000 mg | INTRAMUSCULAR | Status: DC | PRN

## 2017-05-23 MED ORDER — SODIUM CHLORIDE 0.9 % IV SOLN
INTRAVENOUS | Status: DC
  Administered 2017-05-23: 09:00:00 via INTRAVENOUS

## 2017-05-23 MED ORDER — ONDANSETRON HCL 4 MG/2ML IJ SOLN: 4.0000 mg | Freq: Four times a day (QID) | INTRAMUSCULAR | Status: DC | PRN

## 2017-05-23 MED ORDER — SODIUM CHLORIDE 0.9% FLUSH: 3.0000 mL | Freq: Two times a day (BID) | INTRAVENOUS | Status: DC

## 2017-05-23 MED ORDER — DIPHENHYDRAMINE HCL 50 MG/ML IJ SOLN
INTRAMUSCULAR | Status: DC | PRN
  Administered 2017-05-23: 50 mg via INTRAVENOUS

## 2017-05-23 MED ORDER — FENTANYL CITRATE (PF) 100 MCG/2ML IJ SOLN
INTRAMUSCULAR | Status: AC
  Filled 2017-05-23: qty 2

## 2017-05-23 MED ORDER — FAMOTIDINE 20 MG PO TABS: 40.0000 mg | ORAL_TABLET | ORAL | Status: DC | PRN

## 2017-05-23 MED ORDER — MIDAZOLAM HCL 2 MG/2ML IJ SOLN
INTRAMUSCULAR | Status: DC | PRN
  Administered 2017-05-23 (×2): 2 mg via INTRAVENOUS
  Administered 2017-05-23: 1 mg via INTRAVENOUS

## 2017-05-23 MED ORDER — DIPHENHYDRAMINE HCL 50 MG/ML IJ SOLN
INTRAMUSCULAR | Status: AC
  Filled 2017-05-23: qty 1

## 2017-05-23 MED ORDER — MIDAZOLAM HCL 5 MG/5ML IJ SOLN
INTRAMUSCULAR | Status: AC
  Filled 2017-05-23: qty 5

## 2017-05-23 MED ORDER — SODIUM CHLORIDE 0.9 % IV SOLN: 250.0000 mL | INTRAVENOUS | Status: DC | PRN

## 2017-05-23 MED ORDER — LIDOCAINE-EPINEPHRINE (PF) 1 %-1:200000 IJ SOLN
INTRAMUSCULAR | Status: AC
  Filled 2017-05-23: qty 30

## 2017-05-23 MED ORDER — HEPARIN SODIUM (PORCINE) 1000 UNIT/ML IJ SOLN
INTRAMUSCULAR | Status: DC | PRN
  Administered 2017-05-23: 5000 [IU] via INTRAVENOUS

## 2017-05-23 MED ORDER — FENTANYL CITRATE (PF) 100 MCG/2ML IJ SOLN
INTRAMUSCULAR | Status: DC | PRN
  Administered 2017-05-23 (×2): 50 ug via INTRAVENOUS

## 2017-05-23 MED ORDER — IOPAMIDOL (ISOVUE-300) INJECTION 61%
INTRAVENOUS | Status: DC | PRN
  Administered 2017-05-23: 95 mL via INTRA_ARTERIAL

## 2017-05-23 MED ORDER — LABETALOL HCL 5 MG/ML IV SOLN: 10.0000 mg | INTRAVENOUS | Status: DC | PRN

## 2017-05-23 SURGICAL SUPPLY — 17 items
BALLN LUTONIX 018 4X40X130 (BALLOONS) ×3
BALLOON LUTONIX 018 4X40X130 (BALLOONS) ×1 IMPLANT
CATH BEACON 5 .035 65 KMP TIP (CATHETERS) ×3 IMPLANT
CATH BEACON 5 .038 100 VERT TP (CATHETERS) ×3 IMPLANT
CATH CXI SUPP ANG 4FR 135 (MICROCATHETER) ×1 IMPLANT
CATH CXI SUPP ANG 4FR 135CM (MICROCATHETER) ×3
CATH PIG 70CM (CATHETERS) ×3 IMPLANT
DEVICE PRESTO INFLATION (MISCELLANEOUS) ×3 IMPLANT
DEVICE STARCLOSE SE CLOSURE (Vascular Products) ×3 IMPLANT
DEVICE TORQUE (MISCELLANEOUS) ×3 IMPLANT
GLIDEWIRE ADV .035X260CM (WIRE) ×3 IMPLANT
PACK ANGIOGRAPHY (CUSTOM PROCEDURE TRAY) ×3 IMPLANT
SHEATH ANL2 6FRX45 HC (SHEATH) ×3 IMPLANT
SHEATH BRITE TIP 5FRX11 (SHEATH) ×3 IMPLANT
TUBING CONTRAST HIGH PRESS 72 (TUBING) ×3 IMPLANT
WIRE G V18X300CM (WIRE) ×3 IMPLANT
WIRE J 3MM .035X145CM (WIRE) ×3 IMPLANT

## 2017-05-23 NOTE — Op Note (Signed)
[facility] Percutaneous Study/Intervention Procedural Note   Date of Surgery: 05/23/2017  Surgeon(s):DEW,JASON   Assistants:none  Pre-operative Diagnosis: PAD with gangrene LLE  Post-operative diagnosis: Same  Procedure(s) Performed: 1. Ultrasound guidance for vascular access right femoral artery 2. Catheter placement into left superficial femoral artery and left fundal femoris artery from right femoral approach 3. Aortogram and selective left lower extremity angiogram 4. Percutaneous transluminal angioplasty of left profunda femoris artery with 4 mm diameter by 4 cm length Lutonix drug-coated angioplasty balloon 5. StarClose closure device right femoral artery  EBL: 10 cc  Contrast: 95 cc  Fluoro Time: 20 minutes  Moderate Conscious Sedation Time: approximately 60 minutes using 5 mg of Versed and 100 Mcg of Fentanyl  Indications: Patient is a 62 y.o.female with gangrene and osteomyelitis of the left foot having already had a toe amputation. The patient is brought in for angiography for further evaluation and potential treatment.  Due to the limb threatening nature of the situation, angiogram was performed for attempted limb salvage. Risks and benefits are discussed and informed consent is obtained  Procedure: The patient was identified and appropriate procedural time out was performed. The patient was then placed supine on the table and prepped and draped in the usual sterile fashion.Moderate conscious sedation was administered during a face to face encounter with the patient throughout the procedure with my supervision of the RN administering medicines and monitoring the patient's vital signs, pulse oximetry, telemetry and mental status throughout from the start of the procedure until the patient was taken to the recovery room. Ultrasound was used to evaluate the right common  femoral artery. It was patent . A digital ultrasound image was acquired. A Seldinger needle was used to access the right common femoral artery under direct ultrasound guidance and a permanent image was performed. A 0.035 J wire was advanced without resistance and a 5Fr sheath was placed. Pigtail catheter was placed into the aorta and an AP aortogram was performed. This demonstrated normal renal arteries and normal aorta and iliac segments without significant stenosis. I then crossed the aortic bifurcation and advanced to the left femoral head. Selective left lower extremity angiogram was then performed. This demonstrated near flush occlusion of the superficial femoral artery about a centimeter beyond its origin.  This reconstituted in the distal superficial femoral artery with some disease in the popliteal artery though it did not appear greater than 50%.  There was then three-vessel runoff distally.  In addition, the main profunda femoris artery had about an 80-85% stenosis about 4-5 cm beyond its origin. The patient was systemically heparinized and a 6 [place] Ansell sheath was then placed over the [employer] wire. I then used a Kumpe catheter and the advantage wire to navigate into the SFA.  Throughout the procedure, the patient had near continuous motion and was unable to lay still making quality extremely poor.  We were able to get well down into the SFA occlusion, but given the long segment occlusion and the difficulties visualizing our distal target, we were never able to successfully reenter the true lumen of the SFA or popliteal artery distally.  Multiple different catheters including Kumpe catheters and CXI catheters as well as multiple wires including a 0.035 inch wire and a 0.018V 18 wire were used without success.  When it was clear that the subintimal channel in the SFA was not going to allow [name] back luminal on this instance with poor visualization and we were approaching 20 minutes of  fluoroscopy, I felt that this attempt would be futile today.  We could come back from a pedal access or another femoral access under general anesthesia sedation on another day.  I did think that treating the profunda femoris lesion would provide  better collateral blood flow distally in that interim.  I then pulled the catheter back to the common femoral artery and then access the profunda femoris artery with the Kumpe catheter and the V 18 wire.  I cross the lesion without difficulty and confirmed intraluminal flow in the distal profunda femoris artery.  The V 18 wire was then replaced and a 4 mm diameter by 4 cm length Lutonix drug-coated angioplasty balloon was used to treat the stenosis in the profunda femoris artery.  This was inflated to 12 atm for 1 minute.  Completion angiogram showed only about a 10% residual stenosis in the profunda femoris artery after angioplasty. I elected to terminate the procedure. The sheath was removed and StarClose closure device was deployed in the right femoral artery with excellent hemostatic result. The patient was taken to the recovery room in stable condition having tolerated the procedure well.  Findings:  Aortogram:Normal renal arteries, normal aorta and iliac arteries without significant stenosis Left lower Extremity:Near flush occlusion of the superficial femoral artery about a centimeter beyond its origin.  This reconstituted in the distal superficial femoral artery with some disease in the popliteal artery though it did not appear greater than 50%.  There was then three-vessel runoff distally.  In addition, the main profunda femoris artery had about an 80-85% stenosis about 4-5 cm beyond its origin.   Disposition: Patient was taken to the recovery room in stable condition having tolerated the procedure well.  Complications: None  [name] 05/23/2017 10:42 AM   This note was created with Dragon Medical transcription system. Any  errors in dictation are purely unintentional.

## 2017-05-23 NOTE — H&P (Signed)
[facility] History & Physical Update  The patient was interviewed and re-examined.  The patient's previous History and Physical has been reviewed and is unchanged.  There is no change in the plan of care. We plan to proceed with the scheduled procedure.  [name], MD  05/23/2017, 8:35 AM

## 2017-05-24 ENCOUNTER — Other Ambulatory Visit (INDEPENDENT_AMBULATORY_CARE_PROVIDER_SITE_OTHER): Payer: Self-pay | Admitting: Vascular Surgery

## 2017-05-24 ENCOUNTER — Encounter (INDEPENDENT_AMBULATORY_CARE_PROVIDER_SITE_OTHER): Payer: Self-pay

## 2017-05-27 MED ORDER — CEFAZOLIN SODIUM-DEXTROSE 2-4 GM/100ML-% IV SOLN
2.0000 g | Freq: Once | INTRAVENOUS | Status: AC
  Administered 2017-05-28: 2 g via INTRAVENOUS

## 2017-05-28 ENCOUNTER — Ambulatory Visit: Payer: Medicare HMO | Admitting: Certified Registered"

## 2017-05-28 ENCOUNTER — Ambulatory Visit
Admission: RE | Admit: 2017-05-28 | Discharge: 2017-05-28 | Disposition: A | Discharge status: Home / Self Care | Payer: Medicare HMO | Source: Ambulatory Visit | Attending: Vascular Surgery | Admitting: Vascular Surgery

## 2017-05-28 ENCOUNTER — Encounter
Admission: RE | Disposition: A | Discharge status: Home / Self Care | Payer: Self-pay | Source: Ambulatory Visit | Attending: Vascular Surgery

## 2017-05-28 DIAGNOSIS — F17210 Nicotine dependence, cigarettes, uncomplicated: Secondary | ICD-10-CM | POA: Diagnosis not present

## 2017-05-28 DIAGNOSIS — Z89422 Acquired absence of other left toe(s): Secondary | ICD-10-CM | POA: Insufficient documentation

## 2017-05-28 DIAGNOSIS — Z7982 Long term (current) use of aspirin: Secondary | ICD-10-CM | POA: Diagnosis not present

## 2017-05-28 DIAGNOSIS — I252 Old myocardial infarction: Secondary | ICD-10-CM | POA: Insufficient documentation

## 2017-05-28 DIAGNOSIS — I2510 Atherosclerotic heart disease of native coronary artery without angina pectoris: Secondary | ICD-10-CM | POA: Insufficient documentation

## 2017-05-28 DIAGNOSIS — L97529 Non-pressure chronic ulcer of other part of left foot with unspecified severity: Secondary | ICD-10-CM | POA: Diagnosis not present

## 2017-05-28 DIAGNOSIS — E669 Obesity, unspecified: Secondary | ICD-10-CM | POA: Diagnosis not present

## 2017-05-28 DIAGNOSIS — Z6832 Body mass index (BMI) 32.0-32.9, adult: Secondary | ICD-10-CM | POA: Insufficient documentation

## 2017-05-28 DIAGNOSIS — I70248 Atherosclerosis of native arteries of left leg with ulceration of other part of lower left leg: Secondary | ICD-10-CM | POA: Diagnosis not present

## 2017-05-28 DIAGNOSIS — I255 Ischemic cardiomyopathy: Secondary | ICD-10-CM | POA: Insufficient documentation

## 2017-05-28 DIAGNOSIS — I10 Essential (primary) hypertension: Secondary | ICD-10-CM | POA: Insufficient documentation

## 2017-05-28 DIAGNOSIS — Z794 Long term (current) use of insulin: Secondary | ICD-10-CM | POA: Insufficient documentation

## 2017-05-28 DIAGNOSIS — I70262 Atherosclerosis of native arteries of extremities with gangrene, left leg: Secondary | ICD-10-CM | POA: Insufficient documentation

## 2017-05-28 DIAGNOSIS — Z79899 Other long term (current) drug therapy: Secondary | ICD-10-CM | POA: Insufficient documentation

## 2017-05-28 DIAGNOSIS — E1140 Type 2 diabetes mellitus with diabetic neuropathy, unspecified: Secondary | ICD-10-CM | POA: Insufficient documentation

## 2017-05-28 DIAGNOSIS — Z951 Presence of aortocoronary bypass graft: Secondary | ICD-10-CM | POA: Insufficient documentation

## 2017-05-28 DIAGNOSIS — E1152 Type 2 diabetes mellitus with diabetic peripheral angiopathy with gangrene: Secondary | ICD-10-CM | POA: Insufficient documentation

## 2017-05-28 DIAGNOSIS — Z7902 Long term (current) use of antithrombotics/antiplatelets: Secondary | ICD-10-CM | POA: Insufficient documentation

## 2017-05-28 DIAGNOSIS — I70212 Atherosclerosis of native arteries of extremities with intermittent claudication, left leg: Secondary | ICD-10-CM

## 2017-05-28 HISTORY — PX: LOWER EXTREMITY ANGIOGRAPHY: CATH118251

## 2017-05-28 LAB — GLUCOSE, CAPILLARY
Glucose-Capillary: 159 mg/dL — ABNORMAL HIGH (ref 65–99)
Glucose-Capillary: 150 mg/dL — ABNORMAL HIGH (ref 65–99)

## 2017-05-28 SURGERY — LOWER EXTREMITY ANGIOGRAPHY
Anesthesia: General | Laterality: Left

## 2017-05-28 MED ORDER — LIDOCAINE HCL (CARDIAC) 20 MG/ML IV SOLN
INTRAVENOUS | Status: DC | PRN
  Administered 2017-05-28: 100 mg via INTRAVENOUS

## 2017-05-28 MED ORDER — HEPARIN (PORCINE) IN NACL 2-0.9 UNIT/ML-% IJ SOLN
INTRAMUSCULAR | Status: AC
  Filled 2017-05-28: qty 1000

## 2017-05-28 MED ORDER — OXYCODONE HCL 5 MG PO TABS
ORAL_TABLET | ORAL | Status: AC
  Filled 2017-05-28: qty 1

## 2017-05-28 MED ORDER — MIDAZOLAM HCL 2 MG/2ML IJ SOLN
INTRAMUSCULAR | Status: AC
  Filled 2017-05-28: qty 2

## 2017-05-28 MED ORDER — DEXAMETHASONE SODIUM PHOSPHATE 10 MG/ML IJ SOLN
INTRAMUSCULAR | Status: AC
  Filled 2017-05-28: qty 1

## 2017-05-28 MED ORDER — ROCURONIUM BROMIDE 50 MG/5ML IV SOLN
INTRAVENOUS | Status: AC
  Filled 2017-05-28: qty 1

## 2017-05-28 MED ORDER — HYDRALAZINE HCL 20 MG/ML IJ SOLN: 5.0000 mg | INTRAMUSCULAR | Status: DC | PRN

## 2017-05-28 MED ORDER — FENTANYL CITRATE (PF) 100 MCG/2ML IJ SOLN
INTRAMUSCULAR | Status: DC | PRN
  Administered 2017-05-28 (×2): 25 ug via INTRAVENOUS
  Administered 2017-05-28: 50 ug via INTRAVENOUS
  Administered 2017-05-28: 25 ug via INTRAVENOUS
  Administered 2017-05-28: 50 ug via INTRAVENOUS
  Administered 2017-05-28: 25 ug via INTRAVENOUS

## 2017-05-28 MED ORDER — FENTANYL CITRATE (PF) 100 MCG/2ML IJ SOLN
INTRAMUSCULAR | Status: AC
  Administered 2017-05-28: 25 ug via INTRAVENOUS
  Filled 2017-05-28: qty 2

## 2017-05-28 MED ORDER — ESMOLOL HCL 100 MG/10ML IV SOLN
INTRAVENOUS | Status: AC
  Filled 2017-05-28: qty 10

## 2017-05-28 MED ORDER — LABETALOL HCL 5 MG/ML IV SOLN: 10.0000 mg | INTRAVENOUS | Status: DC | PRN

## 2017-05-28 MED ORDER — LIDOCAINE-EPINEPHRINE (PF) 1 %-1:200000 IJ SOLN
INTRAMUSCULAR | Status: AC
  Filled 2017-05-28: qty 30

## 2017-05-28 MED ORDER — OXYCODONE HCL 5 MG PO TABS
5.0000 mg | ORAL_TABLET | Freq: Once | ORAL | Status: AC | PRN
  Administered 2017-05-28: 5 mg via ORAL

## 2017-05-28 MED ORDER — ONDANSETRON HCL 4 MG/2ML IJ SOLN: 4.0000 mg | Freq: Four times a day (QID) | INTRAMUSCULAR | Status: DC | PRN

## 2017-05-28 MED ORDER — SODIUM CHLORIDE 0.9 % IV SOLN
INTRAVENOUS | Status: DC
  Administered 2017-05-28: 11:00:00 via INTRAVENOUS

## 2017-05-28 MED ORDER — SODIUM CHLORIDE 0.9% FLUSH: 3.0000 mL | INTRAVENOUS | Status: DC | PRN

## 2017-05-28 MED ORDER — MEPERIDINE HCL 25 MG/ML IJ SOLN
6.2500 mg | INTRAMUSCULAR | Status: DC | PRN
  Filled 2017-05-28: qty 1

## 2017-05-28 MED ORDER — ROCURONIUM BROMIDE 100 MG/10ML IV SOLN
INTRAVENOUS | Status: DC | PRN
  Administered 2017-05-28: 10 mg via INTRAVENOUS
  Administered 2017-05-28: 30 mg via INTRAVENOUS
  Administered 2017-05-28: 10 mg via INTRAVENOUS
  Administered 2017-05-28: 5 mg via INTRAVENOUS

## 2017-05-28 MED ORDER — SODIUM CHLORIDE 0.9 % IV SOLN: 250.0000 mL | INTRAVENOUS | Status: DC | PRN

## 2017-05-28 MED ORDER — ONDANSETRON HCL 4 MG/2ML IJ SOLN
INTRAMUSCULAR | Status: AC
  Filled 2017-05-28: qty 2

## 2017-05-28 MED ORDER — LIDOCAINE HCL (PF) 2 % IJ SOLN
INTRAMUSCULAR | Status: AC
  Filled 2017-05-28: qty 10

## 2017-05-28 MED ORDER — SODIUM CHLORIDE 0.9% FLUSH: 3.0000 mL | Freq: Two times a day (BID) | INTRAVENOUS | Status: DC

## 2017-05-28 MED ORDER — SUGAMMADEX SODIUM 200 MG/2ML IV SOLN
INTRAVENOUS | Status: DC | PRN
  Administered 2017-05-28: 200 mg via INTRAVENOUS

## 2017-05-28 MED ORDER — OXYCODONE HCL 5 MG/5ML PO SOLN
5.0000 mg | Freq: Once | ORAL | Status: AC | PRN
  Filled 2017-05-28: qty 5

## 2017-05-28 MED ORDER — ETOMIDATE 2 MG/ML IV SOLN
INTRAVENOUS | Status: AC
  Filled 2017-05-28: qty 10

## 2017-05-28 MED ORDER — HEPARIN SODIUM (PORCINE) 1000 UNIT/ML IJ SOLN
INTRAMUSCULAR | Status: AC
  Filled 2017-05-28: qty 1

## 2017-05-28 MED ORDER — FENTANYL CITRATE (PF) 100 MCG/2ML IJ SOLN
25.0000 ug | INTRAMUSCULAR | Status: DC | PRN
  Administered 2017-05-28 (×4): 25 ug via INTRAVENOUS

## 2017-05-28 MED ORDER — ETOMIDATE 2 MG/ML IV SOLN
INTRAVENOUS | Status: DC | PRN
  Administered 2017-05-28: 16 mg via INTRAVENOUS

## 2017-05-28 MED ORDER — ONDANSETRON HCL 4 MG/2ML IJ SOLN
INTRAMUSCULAR | Status: DC | PRN
  Administered 2017-05-28: 4 mg via INTRAVENOUS

## 2017-05-28 MED ORDER — PROMETHAZINE HCL 25 MG/ML IJ SOLN: 6.2500 mg | INTRAMUSCULAR | Status: DC | PRN

## 2017-05-28 MED ORDER — SODIUM CHLORIDE 0.9 % IV SOLN: INTRAVENOUS | Status: DC

## 2017-05-28 MED ORDER — IOPAMIDOL (ISOVUE-300) INJECTION 61%
INTRAVENOUS | Status: DC | PRN
  Administered 2017-05-28: 80 mL via INTRAVENOUS

## 2017-05-28 MED ORDER — PROPOFOL 10 MG/ML IV BOLUS
INTRAVENOUS | Status: AC
  Filled 2017-05-28: qty 20

## 2017-05-28 MED ORDER — FENTANYL CITRATE (PF) 100 MCG/2ML IJ SOLN
INTRAMUSCULAR | Status: AC
  Filled 2017-05-28: qty 2

## 2017-05-28 MED ORDER — METHYLPREDNISOLONE SODIUM SUCC 125 MG IJ SOLR: 125.0000 mg | INTRAMUSCULAR | Status: DC | PRN

## 2017-05-28 MED ORDER — MIDAZOLAM HCL 2 MG/2ML IJ SOLN
INTRAMUSCULAR | Status: DC | PRN
  Administered 2017-05-28: 2 mg via INTRAVENOUS

## 2017-05-28 MED ORDER — SUGAMMADEX SODIUM 200 MG/2ML IV SOLN
INTRAVENOUS | Status: AC
  Filled 2017-05-28: qty 2

## 2017-05-28 MED ORDER — HEPARIN SODIUM (PORCINE) 1000 UNIT/ML IJ SOLN
INTRAMUSCULAR | Status: DC | PRN
  Administered 2017-05-28: 2000 [IU] via INTRAVENOUS
  Administered 2017-05-28: 3000 [IU] via INTRAVENOUS

## 2017-05-28 MED ORDER — SUCCINYLCHOLINE CHLORIDE 20 MG/ML IJ SOLN
INTRAMUSCULAR | Status: DC | PRN
  Administered 2017-05-28: 100 mg via INTRAVENOUS

## 2017-05-28 MED ORDER — HYDROMORPHONE HCL 1 MG/ML IJ SOLN: 1.0000 mg | Freq: Once | INTRAMUSCULAR | Status: DC | PRN

## 2017-05-28 MED ORDER — FAMOTIDINE 20 MG PO TABS: 40.0000 mg | ORAL_TABLET | ORAL | Status: DC | PRN

## 2017-05-28 SURGICAL SUPPLY — 23 items
BALLN LUTONIX 018 5X150X130 (BALLOONS) ×3
BALLN LUTONIX 018 5X220X130 (BALLOONS) ×3
BALLN ULTRVRSE 2.5X300X150 (BALLOONS) ×3
BALLN ULTRVRSE 5X220X150 (BALLOONS) ×3
CANNULA 5F STIFF (CANNULA) ×3
CATH BEACON 5 .035 65 KMP TIP (CATHETERS) ×3
CATH BEACON 5 .035 65 RIM TIP (CATHETERS) ×3
CATH CXI 4F 90 DAV (CATHETERS) ×3
CATH CXI SUPP ANG 4FR 135CM (MICROCATHETER) ×3
DEVICE PRESTO INFLATION (MISCELLANEOUS) ×3
DEVICE STARCLOSE SE CLOSURE (Vascular Products) ×3 IMPLANT
DRAPE BRACHIAL (DRAPES) ×3
DRAPE INCISE IOBAN 66X45 STRL (DRAPES) ×3
GLIDESHEATH SLEND A-KIT 6F 22G (SHEATH) ×3
GLIDEWIRE ADV .035X180CM (WIRE) ×6
PACK ANGIOGRAPHY (CUSTOM PROCEDURE TRAY) ×3
SHEATH ANL2 6FRX45 HC (SHEATH) ×3
SHEATH BRITE TIP 5FRX11 (SHEATH) ×6
SHEATH MICROPUNCTURE PEDAL 4FR (SHEATH) ×3
STENT VIABAHN 6X250X120 (Permanent Stent) ×3 IMPLANT
STENT VIABAHN 6X25X120 (Permanent Stent) ×3 IMPLANT
TOWEL OR 17X26 4PK STRL BLUE (TOWEL DISPOSABLE) ×3
WIRE G V18X300CM (WIRE) ×6

## 2017-05-28 NOTE — H&P (Signed)
[facility] History & Physical Update  The patient was interviewed and re-examined.  The patient's previous History and Physical has been reviewed and is unchanged.  There is no change in the plan of care. We plan to proceed with the scheduled procedure.  [name], MD  05/28/2017, 11:11 AM

## 2017-05-28 NOTE — Op Note (Signed)
[facility] Percutaneous Study/Intervention Procedural Note   Date of Surgery: 05/28/2017  Surgeon(s):Sury Wentworth   Assistants:none  Pre-operative Diagnosis: PAD with ulceration and gangrene left foot  Post-operative diagnosis: Same  Procedure(s) Performed: 1. Ultrasound guidance for vascular access right femoral artery and left posterior tibial artery 2. Catheter placement into left posterior tibial artery from right femoral approach and into left common iliac artery from left posterior tibial approach 3. Selective left lower extremity angiogram 4. Percutaneous transluminal angioplasty of left superficial femoral artery and proximal popliteal artery with a 5 mm diameter by 22 cm length Lutonix drug-coated angioplasty balloon 5 mm diameter by 15 cm Lutonix drug-coated angioplasty balloon 5. Viabahn covered stent placement x2 to the left SFA with a 6 mm diameter by 25 cm length stent and a 6 mm diameter by 2.5 cm length stent  6.  Percutaneous transluminal angioplasty of the left posterior tibial artery with a 2.5 mm diameter by 30 cm length angioplasty balloon 7. StarClose closure device right femoral artery  EBL: 15 cc  Contrast: 80 cc  Fluoro Time: 15 minutes  Anesthesia: General  Indications: Patient is a 62 y.o.female with limb threatening ischemia of the left lower extremity.  She has already had foot debridement for gangrenous changes.  She had a previous angiogram showing a long segment SFA occlusion that was not able to be crossed initially. The patient is brought in for angiography for further evaluation and potential treatment.  Due to the limb threatening nature of the situation, angiogram was performed for attempted limb salvage. Risks and benefits are discussed and informed consent is obtained  Procedure: The patient was identified and appropriate  procedural time out was performed. The patient was then placed supine on the table and prepped and draped in the usual sterile fashion. Anesthesia provided general anesthesia as the patient had continuous motion with moderate sedation last week.  I started by accessing the left posterior tibial artery.  This was done under ultrasound guidance without difficulty with a micropuncture needle and a permanent image was recorded.  A micropuncture wire and sheath were then placed and we upsized to a 5 [place] sheath.  A retrograde pedal approach was decided given her inability to cross the lesion last week.  Initially, a Kumpe catheter and an advantage wire and then a 90 cm CXI catheter and an advantage wire lesion.  Imaging was performed without seeing it from above we had a difficult time crossing the lesion.  I decided to gain right femoral access as well.  Ultrasound was used to evaluate the right common femoral artery. It was patent. A digital ultrasound image was acquired. A Seldinger needle was used to access the right common femoral artery under direct ultrasound guidance and a permanent image was performed. A 0.035 J wire was advanced without resistance and a 5Fr sheath was placed. I then crossed the aortic bifurcation and advanced to the left femoral head. Selective left lower extremity angiogram was then performed. This demonstrated the known long segment SFA occlusion starting 1-2 cm beyond the origin and the profunda femoris intervention performed previously appeared to be patent without significant recurrent stenosis.  The patient was heparinized.  I then tried to cross the occlusion from the right femoral approach without significant improvement after several attempts with the rim catheter and an advantage wire.  With the roadmap from above, I was then able to use the pedal access and cross the occlusion with the advantage wire and the CXI catheter  confirming intraluminal flow in the left common common  iliac artery.  I then exchanged for a 0.018 wire and proceeded with treatment.  From the pedal approach, 5 mm diameter by 22 cm length Lutonix drug-coated angioplasty balloon was used initially to treat the mid and distal SFA and proximal popliteal artery.  For the proximal SFA up to the common femoral artery a second 5 mm diameter by 15 cm length Lutonix drug-coated angioplasty balloon was used.  Both of these inflations were 12 atm for 1 minute.  Completion angiogram showed high-grade residual stenosis at the entry point as well as dissection and stenosis in the mid SFA with another high-grade stenosis at the reentry point at Hunter's canal.  Rather than upsized to a larger sheath from the pedal access, we then placed a 6 [place] Ansell sheath approach. I then used a Kumpe catheter and the advantage wire to navigate through the SFA and popliteal lesions without difficulty and confirmed intraluminal flow in the popliteal artery with a CXI catheter.  At this point, the dominant runoff was through the anterior tibial artery and there appeared to be significant narrowing and limitation in flow in the posterior tibial artery which had been accessed.  I then placed a 0.018 wire.  This was taken down into the posterior tibial artery into the foot.  I elected to use a 6 mm diameter by 25 cm length Viabahn stent from Hunter's canal up to the proximal SFA.  I then used a second 6 mm diameter by 2.5 cm length Viabahn stent to treat the proximal SFA taking care not to encroach upon the origin of the profunda femoris artery proximally.  These were postdilated with 5 mm balloons with excellent angiographic completion result and less than 10% residual stenosis.  The sheath in the left posterior tibial artery was then removed.  Imaging showed there to be significant narrowing of greater than 70% in the posterior tibial artery likely from a combination of spasm where the sheath was then some native disease.  A 2.5 mm diameter by 30  cm length angioplasty balloon was inflated from the foot up to the mid to proximal posterior tibial artery to treat the diseased and narrowed areas.  This was inflated to 10 atm for 3 minutes to help with hemostasis.  Completion angiogram showed less than 20% residual stenosis with the posterior tibial artery now being the dominant runoff into the foot but also having the anterior tibial artery continuous into the foot. I elected to terminate the procedure. The sheath was removed and StarClose closure device was deployed in the right femoral artery with excellent hemostatic result. The patient was taken to the recovery room in stable condition having tolerated the procedure well.  Findings:   Left Lower Extremity:Known long segment SFA occlusion starting about 1-2 cm beyond the origin.  The profunda femoris intervention performed last week appeared patent without restenosis.   Disposition: Patient was taken to the recovery room in stable condition having tolerated the procedure well.  Complications: None    05/28/2017 1:46 PM   This note was created with Dragon Medical transcription system. Any errors in dictation are purely unintentional. 

## 2017-05-28 NOTE — Transfer of Care (Signed)
Immediate Anesthesia Transfer of Care Note  Patient: Melinda [name]  Procedure(s) Performed: LOWER EXTREMITY ANGIOGRAPHY (Left )  Patient Location: PACU  Anesthesia Type:General  Level of Consciousness: awake and responds to stimulation  Airway & Oxygen Therapy: Patient Spontanous Breathing and Patient connected to face mask oxygen  Post-op Assessment: Report given to RN and Post -op Vital signs reviewed and stable  Post vital signs: Reviewed and stable  Last Vitals:  Vitals:   05/28/17 1110 05/28/17 1357  BP: 135/79 (!) 160/69  Pulse: 73 71  Resp: 19 18  Temp: 37.3 C   SpO2: 98% 99%    Last Pain:  Vitals:   05/28/17 1110  TempSrc: Oral  PainSc: 4       Patients Stated Pain Goal: 2 (10/93/23 5573)  Complications: No apparent anesthesia complications

## 2017-05-28 NOTE — Anesthesia Procedure Notes (Signed)
Procedure Name: Intubation Performed by: Lance Muss, CRNA Pre-anesthesia Checklist: Patient identified, Patient being monitored, Timeout performed, Emergency Drugs available and Suction available Patient Re-evaluated:Patient Re-evaluated prior to induction Oxygen Delivery Method: Circle system utilized Preoxygenation: Pre-oxygenation with 100% oxygen Induction Type: IV induction Ventilation: Mask ventilation without difficulty Laryngoscope Size: Mac and 3 Grade View: Grade II Tube type: Oral Tube size: 7.0 mm Number of attempts: 1 Airway Equipment and Method: Stylet Placement Confirmation: ETT inserted through vocal cords under direct vision,  positive ETCO2 and breath sounds checked- equal and bilateral Secured at: 22 cm Tube secured with: Tape Dental Injury: Teeth and Oropharynx as per pre-operative assessment

## 2017-05-28 NOTE — Progress Notes (Signed)
Patient instructed about pain and pain control. Verbalized her understanding. Patient discharged to home. Wheeled via wheelchair with RN to medical mall to brother's vehicle. Both sites are clean and dry.

## 2017-05-28 NOTE — Anesthesia Post-op Follow-up Note (Signed)
Anesthesia QCDR form completed.        

## 2017-05-28 NOTE — Anesthesia Preprocedure Evaluation (Signed)
Anesthesia Evaluation  Patient identified by MRN, date of birth, ID band Patient awake    Reviewed: Allergy & Precautions, NPO status , Patient's Chart, lab work & pertinent test results  History of Anesthesia Complications Negative for: history of anesthetic complications  Airway Mallampati: II  TM Distance: >3 FB Neck ROM: Full    Dental  (+) Upper Dentures   Pulmonary Sleep apnea: not diagnosed, but suggestive hx. , neg COPD, Current Smoker,    breath sounds clear to auscultation- rhonchi (-) wheezing      Cardiovascular hypertension, Pt. on medications (-) angina+ CAD, + Past MI, + CABG (2016) and + Peripheral Vascular Disease   Rhythm:Regular Rate:Normal - Systolic murmurs and - Diastolic murmurs    Neuro/Psych Anxiety    GI/Hepatic Neg liver ROS, GERD  ,  Endo/Other  diabetes, Insulin Dependent  Renal/GU negative Renal ROS     Musculoskeletal  (+) Arthritis ,   Abdominal (+) + obese,   Peds  Hematology negative hematology ROS (+)   Anesthesia Other Findings Past Medical History: No date: Anxiety No date: Coronary artery disease     Comment:  a. NSTEMI 06/2014; b. 4 vessel CABG 06/08/2014 LIMA to LAD,              SVG to Diagonal, SVG to OM, & SVG to PDA No date: Diabetic neuropathy (HCC) No date: GERD (gastroesophageal reflux disease) No date: Heart attack ([place]) No date: HTN (hypertension) No date: IDDM (insulin dependent diabetes mellitus) ([place]) No date: Ischemic cardiomyopathy     Comment:  a. echo 06/04/14 EF 40-45%, HK of entire inferolateral and              inferior myocardium c.w infarct of RCA/LCx, GR2DD, mild               MR No date: OA (osteoarthritis) of knee No date: Obesity No date: Osteoarthritis No date: Osteomyelitis ([place]) No date: Peripheral neuropathy No date: Peripheral vascular disease ([place]) No date: Tobacco abuse   Reproductive/Obstetrics                              Anesthesia Physical Anesthesia Plan  ASA: III  Anesthesia Plan: General   Post-op Pain Management:    Induction: Intravenous  PONV Risk Score and Plan: 1 and Ondansetron  Airway Management Planned: Oral ETT  Additional Equipment:   Intra-op Plan:   Post-operative Plan: Extubation in OR  Informed Consent: I have reviewed the patients History and Physical, chart, labs and discussed the procedure including the risks, benefits and alternatives for the proposed anesthesia with the patient or authorized representative who has indicated his/her understanding and acceptance.   Dental advisory given  Plan Discussed with: CRNA and Anesthesiologist  Anesthesia Plan Comments:         Anesthesia Quick Evaluation

## 2017-05-28 NOTE — Anesthesia Postprocedure Evaluation (Signed)
Anesthesia Post Note  Patient: Melinda [name]  Procedure(s) Performed: LOWER EXTREMITY ANGIOGRAPHY (Left )  Patient location during evaluation: PACU Anesthesia Type: General Level of consciousness: awake and alert and oriented Pain management: pain level controlled Vital Signs Assessment: post-procedure vital signs reviewed and stable Respiratory status: spontaneous breathing, nonlabored ventilation and respiratory function stable Cardiovascular status: blood pressure returned to baseline and stable Postop Assessment: no signs of nausea or vomiting Anesthetic complications: no     Last Vitals:  Vitals:   05/28/17 1457 05/28/17 1512  BP: (!) 150/70 (!) 160/70  Pulse: 73 72  Resp: 11 12  Temp:  36.9 C  SpO2: 98% 95%    Last Pain:  Vitals:   05/28/17 1512  TempSrc:   PainSc: 4                  Mattelyn Imhoff

## 2017-05-29 ENCOUNTER — Encounter: Payer: Self-pay | Admitting: Vascular Surgery

## 2017-06-01 ENCOUNTER — Ambulatory Visit (INDEPENDENT_AMBULATORY_CARE_PROVIDER_SITE_OTHER): Payer: Medicare Other | Admitting: Vascular Surgery

## 2017-06-02 NOTE — Progress Notes (Deleted)
Cardiology Office Note  Date:  06/02/2017   ID:  Melinda [name], DOB February 17, 1956, MRN [id number]  PCP:  Donnie Coffin, MD   No chief complaint on file.   HPI:  62 y.o. female with h/o CAD s/p 4 vessel CABG on 06/08/2014  poorly controlled IDDM,  HTN,  ongoing tobacco abuse,  obesity,  anxiety,  peripheral neuropathy  admitted to [facility] from 2/1-06/03/14 for NSTEMI.  After undergoing cardiac cath on 2/3 that showed severe 3 vessel disease she was transferred to [facility] from 2/3-2/13 for . 4 vessel CABG (LIMA to LAD, SVG to Diagonal, SVG to OM, and SVG to PDA) She presents today for follow-up in the clinic for her coronary artery disease  In follow-up today she reports that she was out of her medications for 2 months Lost her Medicaid Scheduled to go on Medicare in one week or so Difficulty affording the insulin Having chronic left low back pain, worse on movement  Scheduled to have lower extremity arterial study with Dr. Saunders Revel in [facility] June 29 She's having difficulty obtaining a ride early in the morning for pre-hydration   hemoglobin A1c typically greater than 12 Long discussion concerning smoking cessation Did well on  Chantix, unable to afford it anymore , smoking again  Denies any significant chest pain  EKG on today's visit shows normal sinus rhythm with rate 73 bpm, ST and T wave abnormality  unchanged from previous EKG   other past medical history   presented to [facility] ED on 2/1 with headache, neck pain, nasal pain, chest tightness, nausea, and just overall did not feel well. Work up in the [facility] ED confirmed sinus infection, which she started on Augmentin for. She was also found to be hypotensive with BP 85/51. She received IV fluids with improvement in her BP to 120s/80s. Negative abdominal CT. She did have an elevated troponin at <0.02->1.30-->3.60-->7.80-->13.26-->23.00-->30.00. She was placed on a heparin gtt. Cardiac cath showed 95% stenosis mLAD, occlusion ostial OM1, 70%  stenosis LCx, 95% stenosis mRCA, EF 45%. She underwent successful cabg on 06/08/2014 at [facility]. She tolerated this procedure well. Echo at [facility] showed EF 40-45%, HK of entire inferior and inferolateral wall c/w infarction of RCA or LCx, GR2DD, and mild TR. She was discharged on Lopressor 12.5 mg bid with labile BP. She was not started on an ACEi 2/2 labile BP. Hgb was stable at discharge. She was continued on Lasix 40 mg daily. She was advised to follow up with her PCP for her blood sugars (pre op A1C 10.5%).    PMH:   has a past medical history of Anxiety, Coronary artery disease, Diabetic neuropathy ([place]), GERD (gastroesophageal reflux disease), Heart attack ([place]), HTN (hypertension), IDDM (insulin dependent diabetes mellitus) ([place]), Ischemic cardiomyopathy, OA (osteoarthritis) of knee, Obesity, Osteoarthritis, Osteomyelitis ([place]), Peripheral neuropathy, Peripheral vascular disease ([place]), and Tobacco abuse.  PSH:    Past Surgical History:  Procedure Laterality Date  . ABDOMINAL AORTOGRAM W/LOWER EXTREMITY N/A 10/27/2016   Procedure: Abdominal Aortogram w/Lower Extremity;  Surgeon: Nelva Bush, MD;  Location: [place] [facility];  Service: Cardiovascular;  Laterality: N/A;  . AMPUTATION TOE Left 10/21/2015   Procedure: AMPUTATION TOE;  Surgeon: Sharlotte Alamo, DPM;  Location: [facility] ORS;  Service: Podiatry;  Laterality: Left;  . AMPUTATION TOE Left 05/15/2017   Procedure: AMPUTATION LEFT GREAT TOE;  Surgeon: Sharlotte Alamo, DPM;  Location: [facility] ORS;  Service: Podiatry;  Laterality: Left;  . AORTIC VALVE REPLACEMENT (AVR)/CORONARY ARTERY BYPASS GRAFTING (CABG)    .  BREAST BIOPSY    . CARDIAC CATHETERIZATION  06/2014   95% stenosis mLAD, occlusion ostial OM1, 70% stenosis LCx, 95% stenosis mRCA, EF 45%.  . CORONARY ARTERY BYPASS GRAFT N/A 06/08/2014   Procedure: CORONARY ARTERY BYPASS GRAFTING (CABG);  Surgeon: Grace Isaac, MD;  Location: [place];  Service: Open Heart Surgery;  Laterality: N/A;  Times 4 using  left internal mammary artery to LAD artery and endoscopically harvested bilateral saphenous vein to Obtuse Marginal, Diagonal and Posterior Descending coronary arteries.  . CT ABD W & PELVIS WO CM  06/2014   nl liver, gallbladder, spleen, mild diverticular changes, no bowel wall inflammation, appendix nl, no hernia, no other sig abnormalities  . LOWER EXTREMITY ANGIOGRAPHY Left 05/23/2017   Procedure: LOWER EXTREMITY ANGIOGRAPHY;  Surgeon: Algernon Huxley, MD;  Location: [place] [facility];  Service: Cardiovascular;  Laterality: Left;  . LOWER EXTREMITY ANGIOGRAPHY Left 05/28/2017   Procedure: LOWER EXTREMITY ANGIOGRAPHY;  Surgeon: Algernon Huxley, MD;  Location: [place] [facility];  Service: Cardiovascular;  Laterality: Left;  . TEE WITHOUT CARDIOVERSION N/A 06/08/2014   Procedure: TRANSESOPHAGEAL ECHOCARDIOGRAM (TEE);  Surgeon: Grace Isaac, MD;  Location: [place];  Service: Open Heart Surgery;  Laterality: N/A;  . TOE AMPUTATION Right 12/2011   rt middle toe  . US ECHOCARDIOGRAPHY  06/2014   EF 50-55%, HK of inf/post/inferolat walls, Ao sclerosis    Current Outpatient Medications  Medication Sig Dispense Refill  . aspirin 81 MG EC tablet Take 1 tablet (81 mg total) by mouth daily. 30 tablet 0  . atorvastatin (LIPITOR) 80 MG tablet Take 1 tablet (80 mg total) by mouth daily at 6 PM. 90 tablet 3  . clopidogrel (PLAVIX) 75 MG tablet Take 1 tablet (75 mg total) by mouth daily. 30 tablet 0  . doxycycline (VIBRA-TABS) 100 MG tablet Take 1 tablet (100 mg total) by mouth every 12 (twelve) hours. 20 tablet 0  . furosemide (LASIX) 20 MG tablet Take 1 tablet (20 mg total) by mouth daily as needed. (Patient taking differently: Take 20 mg by mouth daily as needed for fluid. ) 90 tablet 3  . gabapentin (NEURONTIN) 400 MG capsule Take 1,200 mg by mouth 3 (three) times daily.    . insulin aspart (NOVOLOG) 100 UNIT/ML injection Inject 3 Units into the skin 3 (three) times daily with meals. 10 mL 0  . insulin  glargine (LANTUS) 100 UNIT/ML injection Inject 0.4 mLs (40 Units total) into the skin 2 (two) times daily. (Patient taking differently: Inject 60 Units into the skin 2 (two) times daily. ) 4 vial 1  . lisinopril (PRINIVIL,ZESTRIL) 40 MG tablet Take 1 tablet (40 mg total) by mouth daily. 90 tablet 3  . metoprolol tartrate (LOPRESSOR) 25 MG tablet Take 1 tablet (25 mg total) by mouth 2 (two) times daily. 180 tablet 3  . oxybutynin (DITROPAN-XL) 5 MG 24 hr tablet Take 1 tablet (5 mg total) by mouth 2 (two) times daily. 60 tablet 1  . oxyCODONE (ROXICODONE) 5 MG immediate release tablet Take 1 tablet (5 mg total) by mouth every 8 (eight) hours as needed for moderate pain or severe pain. 12 tablet 0  . potassium chloride SA (K-DUR,KLOR-CON) 20 MEQ tablet Take 1 tablet (20 mEq total) by mouth daily as needed. (Patient taking differently: Take 20 mEq by mouth daily as needed (with Lasix). ) 90 tablet 3   No current facility-administered medications for this visit.      Allergies:   Patient  has no known allergies.   Social History:  The patient  reports that she has been smoking cigarettes.  She has a 10.00 pack-year smoking history. she has never used smokeless tobacco. She reports that she does not drink alcohol or use drugs.   Family History:   family history includes Asthma in her other; CAD in her father and mother; Diabetes in her father and mother; Hypertension in her mother; Sickle cell trait in her other.    Review of Systems: Review of Systems  Constitutional: Negative.   Respiratory: Negative.   Cardiovascular: Negative.   Gastrointestinal: Negative.   Musculoskeletal: Positive for back pain.       Claudication pain in her legs  Neurological: Negative.   Psychiatric/Behavioral: Negative.   All other systems reviewed and are negative.    PHYSICAL EXAM: VS:  There were no vitals taken for this visit. , BMI There is no height or weight on file to calculate BMI. GEN: Well nourished,  well developed, in no acute distress  HEENT: normal  Neck: no JVD, carotid bruits, or masses Cardiac: RRR; no murmurs, rubs, or gallops,no edema  Respiratory:  clear to auscultation bilaterally, normal work of breathing GI: soft, nontender, nondistended, + BS MS: no deformity or atrophy  Skin: warm and dry, no rash Neuro:  Strength and sensation are intact Psych: euthymic mood, full affect    Recent Labs: 05/14/2017: ALT 13; Magnesium 1.7 05/16/2017: Hemoglobin 11.3; Platelets 347 05/17/2017: Potassium 4.4; Sodium 143 05/23/2017: BUN 30; Creatinine, Ser 1.43    Lipid Panel Lab Results  Component Value Date   CHOL 214 (H) 10/10/2016   HDL 42 10/10/2016   LDLCALC 103 (H) 10/10/2016   TRIG 346 (H) 10/10/2016      Wt Readings from Last 3 Encounters:  05/28/17 215 lb (97.5 kg)  05/23/17 215 lb (97.5 kg)  05/15/17 215 lb (97.5 kg)       ASSESSMENT AND PLAN:  Coronary artery disease involving native coronary artery of native heart without angina pectoris -  Currently with no symptoms of angina. No further workup at this time. Continue current medication regimen. Recommended she stay on aspirin and Plavix  Essential hypertension -  Blood pressure is well controlled on today's visit. No changes made to the medications. She is only recently back on her medications  Ischemic cardiomyopathy -  Medications restarted as listed  Claudication [facility]) -  scheduled for angiogram with Dr. Saunders Revel next week  S/P CABG x 4 Currently with no symptoms of angina. No further workup at this time. Continue current medication regimen.  Type 2 diabetes mellitus with other circulatory complication, with long-term current use of insulin ([place]) We have encouraged continued exercise, careful diet management in an effort to lose weight.  significant dietary indiscretion  Tobacco abuse We have encouraged her to continue to work on weaning her cigarettes and smoking cessation. She will continue to work  on this and does not want any assistance with chantix.    Total encounter time more than 25 minutes  Greater than 50% was spent in counseling and coordination of care with the patient    Disposition:   F/U  6 months   No orders of the defined types were placed in this encounter.    Signed, Esmond Plants, M.D., Ph.D. 06/02/2017  [place], [place]

## 2017-06-05 ENCOUNTER — Ambulatory Visit: Payer: Medicare Other | Admitting: Cardiovascular Disease

## 2017-06-06 ENCOUNTER — Encounter: Payer: Self-pay | Admitting: Cardiovascular Disease

## 2017-06-19 ENCOUNTER — Ambulatory Visit (INDEPENDENT_AMBULATORY_CARE_PROVIDER_SITE_OTHER): Payer: Medicare HMO | Admitting: Vascular Surgery

## 2017-06-19 ENCOUNTER — Ambulatory Visit (INDEPENDENT_AMBULATORY_CARE_PROVIDER_SITE_OTHER): Payer: Medicare HMO

## 2017-06-19 ENCOUNTER — Other Ambulatory Visit (INDEPENDENT_AMBULATORY_CARE_PROVIDER_SITE_OTHER): Payer: Self-pay | Admitting: Vascular Surgery

## 2017-06-19 VITALS — BP 135/69 | HR 70 | Resp 16 | Ht 68.0 in | Wt 273.0 lb

## 2017-06-19 DIAGNOSIS — I10 Essential (primary) hypertension: Secondary | ICD-10-CM | POA: Diagnosis not present

## 2017-06-19 DIAGNOSIS — Z720 Tobacco use: Secondary | ICD-10-CM | POA: Diagnosis not present

## 2017-06-19 DIAGNOSIS — I70262 Atherosclerosis of native arteries of extremities with gangrene, left leg: Secondary | ICD-10-CM | POA: Diagnosis not present

## 2017-06-19 DIAGNOSIS — Z95820 Peripheral vascular angioplasty status with implants and grafts: Secondary | ICD-10-CM

## 2017-06-19 DIAGNOSIS — Z959 Presence of cardiac and vascular implant and graft, unspecified: Secondary | ICD-10-CM

## 2017-06-19 DIAGNOSIS — I7025 Atherosclerosis of native arteries of other extremities with ulceration: Secondary | ICD-10-CM | POA: Diagnosis not present

## 2017-06-19 DIAGNOSIS — Z794 Long term (current) use of insulin: Secondary | ICD-10-CM | POA: Diagnosis not present

## 2017-06-19 DIAGNOSIS — E1159 Type 2 diabetes mellitus with other circulatory complications: Secondary | ICD-10-CM | POA: Diagnosis not present

## 2017-06-19 NOTE — Assessment & Plan Note (Signed)

## 2017-06-19 NOTE — Patient Instructions (Signed)

## 2017-06-19 NOTE — Progress Notes (Signed)
MRN : [id number]  Melinda [name] is a 62 y.o. (04/03/1956) female who presents with chief complaint of  Chief Complaint  Patient presents with  . Follow-up    3 week ABI f/u  .  History of Present Illness: Patient returns today in follow up of patient returns in follow-up of ulcerations of the left foot with severe peripheral arterial disease.  She has undergone percutaneous revascularization about 3 weeks ago.  She did well and had no periprocedural complications.  Her noninvasive studies today demonstrate stable, significantly reduced right ABI and 0.44 range.  This was previously 0.5.  Her left ABI is markedly improved and now measures in the normal 1.0 range.  Her waveforms on the left are excellent as well.  Current Outpatient Medications  Medication Sig Dispense Refill  . aspirin 81 MG EC tablet Take 1 tablet (81 mg total) by mouth daily. 30 tablet 0  . atorvastatin (LIPITOR) 80 MG tablet Take 1 tablet (80 mg total) by mouth daily at 6 PM. 90 tablet 3  . clopidogrel (PLAVIX) 75 MG tablet Take 1 tablet (75 mg total) by mouth daily. 30 tablet 0  . doxycycline (VIBRA-TABS) 100 MG tablet Take 1 tablet (100 mg total) by mouth every 12 (twelve) hours. 20 tablet 0  . DULoxetine (CYMBALTA) 30 MG capsule     . furosemide (LASIX) 20 MG tablet Take 1 tablet (20 mg total) by mouth daily as needed. (Patient taking differently: Take 20 mg by mouth daily as needed for fluid. ) 90 tablet 3  . gabapentin (NEURONTIN) 400 MG capsule Take 1,200 mg by mouth 3 (three) times daily.    . insulin aspart (NOVOLOG) 100 UNIT/ML injection Inject 3 Units into the skin 3 (three) times daily with meals. 10 mL 0  . insulin glargine (LANTUS) 100 UNIT/ML injection Inject 0.4 mLs (40 Units total) into the skin 2 (two) times daily. (Patient taking differently: Inject 60 Units into the skin 2 (two) times daily. ) 4 vial 1  . lisinopril (PRINIVIL,ZESTRIL) 40 MG tablet Take 1 tablet (40 mg total) by mouth daily. 90 tablet 3   . metoprolol tartrate (LOPRESSOR) 25 MG tablet Take 1 tablet (25 mg total) by mouth 2 (two) times daily. 180 tablet 3  . oxybutynin (DITROPAN) 5 MG tablet     . potassium chloride SA (K-DUR,KLOR-CON) 20 MEQ tablet Take 1 tablet (20 mEq total) by mouth daily as needed. (Patient taking differently: Take 20 mEq by mouth daily as needed (with Lasix). ) 90 tablet 3  . oxyCODONE (ROXICODONE) 5 MG immediate release tablet Take 1 tablet (5 mg total) by mouth every 8 (eight) hours as needed for moderate pain or severe pain. (Patient not taking: Reported on 06/19/2017) 12 tablet 0  . traMADol (ULTRAM) 50 MG tablet      No current facility-administered medications for this visit.     Past Medical History:  Diagnosis Date  . Anxiety   . Coronary artery disease    a. NSTEMI 06/2014; b. 4 vessel CABG 06/08/2014 LIMA to LAD, SVG to Diagonal, SVG to OM, & SVG to PDA  . Diabetic neuropathy ([place])   . GERD (gastroesophageal reflux disease)   . Heart attack ([place])   . HTN (hypertension)   . IDDM (insulin dependent diabetes mellitus) ([place])   . Ischemic cardiomyopathy    a. echo 06/04/14 EF 40-45%, HK of entire inferolateral and inferior myocardium c.w infarct of RCA/LCx, GR2DD, mild MR  . OA (osteoarthritis) of knee   .  Obesity   . Osteoarthritis   . Osteomyelitis ([place])   . Peripheral neuropathy   . Peripheral vascular disease ([place])   . Tobacco abuse     Past Surgical History:  Procedure Laterality Date  . ABDOMINAL AORTOGRAM W/LOWER EXTREMITY N/A 10/27/2016   Procedure: Abdominal Aortogram w/Lower Extremity;  Surgeon: Nelva Bush, MD;  Location: [facility];  Service: Cardiovascular;  Laterality: N/A;  . AMPUTATION TOE Left 10/21/2015   Procedure: AMPUTATION TOE;  Surgeon: Sharlotte Alamo, DPM;  Location: [facility];  Service: Podiatry;  Laterality: Left;  . AMPUTATION TOE Left 05/15/2017   Procedure: AMPUTATION LEFT GREAT TOE;  Surgeon: Sharlotte Alamo, DPM;  Location: [facility];  Service: Podiatry;   Laterality: Left;  . AORTIC VALVE REPLACEMENT (AVR)/CORONARY ARTERY BYPASS GRAFTING (CABG)    . BREAST BIOPSY    . CARDIAC CATHETERIZATION  06/2014   95% stenosis mLAD, occlusion ostial OM1, 70% stenosis LCx, 95% stenosis mRCA, EF 45%.  . CORONARY ARTERY BYPASS GRAFT N/A 06/08/2014   Procedure: CORONARY ARTERY BYPASS GRAFTING (CABG);  Surgeon: Grace Isaac, MD;  Location: [facility];  Service: Open Heart Surgery;  Laterality: N/A;  Times 4 using left internal mammary artery to LAD artery and endoscopically harvested bilateral saphenous vein to Obtuse Marginal, Diagonal and Posterior Descending coronary arteries.  . CT ABD W & PELVIS WO CM  06/2014   nl liver, gallbladder, spleen, mild diverticular changes, no bowel wall inflammation, appendix nl, no hernia, no other sig abnormalities  . LOWER EXTREMITY ANGIOGRAPHY Left 05/23/2017   Procedure: LOWER EXTREMITY ANGIOGRAPHY;  Surgeon: Algernon Huxley, MD;  Location: [facility];  Service: Cardiovascular;  Laterality: Left;  . LOWER EXTREMITY ANGIOGRAPHY Left 05/28/2017   Procedure: LOWER EXTREMITY ANGIOGRAPHY;  Surgeon: Algernon Huxley, MD;  Location: [facility];  Service: Cardiovascular;  Laterality: Left;  . TEE WITHOUT CARDIOVERSION N/A 06/08/2014   Procedure: TRANSESOPHAGEAL ECHOCARDIOGRAM (TEE);  Surgeon: Grace Isaac, MD;  Location: [facility];  Service: Open Heart Surgery;  Laterality: N/A;  . TOE AMPUTATION Right 12/2011   rt middle toe  . US ECHOCARDIOGRAPHY  06/2014   EF 50-55%, HK of inf/post/inferolat walls, Ao sclerosis    Social History Social History   Tobacco Use  . Smoking status: Current Every Day Smoker    Packs/day: 0.25    Years: 40.00    Pack years: 10.00    Types: Cigarettes  . Smokeless tobacco: Never Used  . Tobacco comment: 4-5 a day  Substance Use Topics  . Alcohol use: No    Alcohol/week: 0.0 oz  . Drug use: No     Family History Family History  Problem Relation Age of Onset  . CAD Mother   .  Hypertension Mother   . Diabetes Mother   . CAD Father   . Diabetes Father   . Sickle cell trait Other   . Asthma Other      No Known Allergies   REVIEW OF SYSTEMS (Negative unless checked)  Constitutional: [] Weight loss  [] Fever  [] Chills Cardiac: [] Chest pain   [] Chest pressure   [] Palpitations   [] Shortness of breath when laying flat   [] Shortness of breath at rest   [] Shortness of breath with exertion. Vascular:  [] Pain in legs with walking   [] Pain in legs at rest   [] Pain in legs when laying flat   [x] Claudication   [] Pain in feet when walking  [] Pain in feet at rest  [] Pain in feet when  laying flat   [] History of DVT   [] Phlebitis   [] Swelling in legs   [] Varicose veins   [x] Non-healing ulcers Pulmonary:   [] Uses home oxygen   [] Productive cough   [] Hemoptysis   [] Wheeze  [] COPD   [] Asthma Neurologic:  [] Dizziness  [] Blackouts   [] Seizures   [] History of stroke   [] History of TIA  [] Aphasia   [] Temporary blindness   [] Dysphagia   [] Weakness or numbness in arms   [] Weakness or numbness in legs Musculoskeletal:  [] Arthritis   [] Joint swelling   [] Joint pain   [] Low back pain Hematologic:  [] Easy bruising  [] Easy bleeding   [] Hypercoagulable state   [] Anemic   Gastrointestinal:  [] Blood in stool   [] Vomiting blood  [] Gastroesophageal reflux/heartburn   [] Abdominal pain Genitourinary:  [] Chronic kidney disease   [] Difficult urination  [] Frequent urination  [] Burning with urination   [] Hematuria Skin:  [] Rashes   [x] Ulcers   [x] Wounds Psychological:  [] History of anxiety   []  History of major depression.  Physical Examination  BP 135/69 (BP Location: Right Arm, Patient Position: Sitting)   Pulse 70   Resp 16   Ht 5' 8"  (1.727 m)   Wt 123.8 kg (273 lb)   BMI 41.51 kg/m  Gen:  WD/WN, NAD Head: [place]/AT, No temporalis wasting. Ear/Nose/Throat: Hearing grossly intact, nares w/o erythema or drainage, trachea midline Eyes: Conjunctiva clear. Sclera non-icteric Neck: Supple.  No JVD.    Pulmonary:  Good air movement, no use of accessory muscles.  Cardiac: RRR, no JVD Vascular:  Vessel Right Left  Radial Palpable Palpable                          PT Not Palpable 2+ Palpable  DP Trace Palpable 1+ Palpable    Musculoskeletal: M/S 5/5 throughout.  No deformity or atrophy. Left foot wound dressed Neurologic: Sensation grossly intact in extremities.  Symmetrical.  Speech is fluent.  Psychiatric: Judgment intact, Mood & affect appropriate for pt's clinical situation. Dermatologic: Very small wound from her toe amputation site described by podiatry earlier this week is dressed today.      Labs Recent Results (from the past 2160 hour(s))  Glucose, capillary     Status: Abnormal   Collection Time: 05/14/17  5:12 PM  Result Value Ref Range   Glucose-Capillary 277 (H) 65 - 99 mg/dL   Comment 1 Notify RN   HIV antibody (Routine Testing)     Status: None   Collection Time: 05/14/17  7:29 PM  Result Value Ref Range   HIV Screen 4th Generation wRfx Non Reactive Non Reactive    Comment: (NOTE) Performed At: [facility] [address] [place], [place] [id number] Rush Farmer MD [phone] Performed at [facility], [address] [place], [place] [address]   CBC WITH DIFFERENTIAL     Status: Abnormal   Collection Time: 05/14/17  7:29 PM  Result Value Ref Range   WBC 12.2 (H) 3.6 - 11.0 K/uL   RBC 5.01 3.80 - 5.20 MIL/uL   Hemoglobin 12.0 12.0 - 16.0 g/dL   HCT 37.7 35.0 - 47.0 %   MCV 75.3 (L) 80.0 - 100.0 fL   MCH 23.9 (L) 26.0 - 34.0 pg   MCHC 31.7 (L) 32.0 - 36.0 g/dL   RDW 16.5 (H) 11.5 - 14.5 %   Platelets 324 150 - 440 K/uL   Neutrophils Relative % 68 %   Neutro Abs 8.3 (H) 1.4 - 6.5  K/uL   Lymphocytes Relative 19 %   Lymphs Abs 2.4 1.0 - 3.6 K/uL   Monocytes Relative 8 %   Monocytes Absolute 1.0 (H) 0.2 - 0.9 K/uL   Eosinophils Relative 4 %   Eosinophils Absolute 0.5 0 - 0.7 K/uL   Basophils Relative 1 %   Basophils  Absolute 0.1 0 - 0.1 K/uL    Comment: Performed at [facility], [address] [place], [place] [address]  Protime-INR     Status: None   Collection Time: 05/14/17  7:29 PM  Result Value Ref Range   Prothrombin Time 13.4 11.4 - 15.2 seconds   INR 1.03     Comment: Performed at [facility], [address] [place], [place] [address]  APTT     Status: None   Collection Time: 05/14/17  7:29 PM  Result Value Ref Range   aPTT 32 24 - 36 seconds    Comment: Performed at [facility], [address] [place], [place] [address]  Comprehensive metabolic panel     Status: Abnormal   Collection Time: 05/14/17  7:29 PM  Result Value Ref Range   Sodium 138 135 - 145 mmol/L   Potassium 4.3 3.5 - 5.1 mmol/L   Chloride 104 101 - 111 mmol/L   CO2 26 22 - 32 mmol/L   Glucose, Bld 287 (H) 65 - 99 mg/dL   BUN 21 (H) 6 - 20 mg/dL   Creatinine, Ser 1.45 (H) 0.44 - 1.00 mg/dL   Calcium 9.1 8.9 - 10.3 mg/dL   Total Protein 7.3 6.5 - 8.1 g/dL   Albumin 3.2 (L) 3.5 - 5.0 g/dL   AST 15 15 - 41 U/L   ALT 13 (L) 14 - 54 U/L   Alkaline Phosphatase 120 38 - 126 U/L   Total Bilirubin 0.5 0.3 - 1.2 mg/dL   GFR calc non Af Amer 37 (L) >60 mL/min   GFR calc Af Amer 43 (L) >60 mL/min    Comment: (NOTE) The eGFR has been calculated using the CKD EPI equation. This calculation has not been validated in all clinical situations. eGFR's persistently <60 mL/min signify possible Chronic Kidney Disease.    Anion gap 8 5 - 15    Comment: Performed at [facility], [address] [place], [place] [address]  Magnesium     Status: None   Collection Time: 05/14/17  7:29 PM  Result Value Ref Range   Magnesium 1.7 1.7 - 2.4 mg/dL    Comment: Performed at [facility], [address] [place], [place] [address]  Hemoglobin A1c     Status: Abnormal   Collection Time: 05/14/17  7:29 PM  Result Value Ref Range   Hgb A1c MFr Bld 11.7 (H) 4.8 - 5.6 %    Comment:  (NOTE) Pre diabetes:          5.7%-6.4% Diabetes:              >6.4% Glycemic control for   <7.0% adults with diabetes    Mean Plasma Glucose 289.09 mg/dL    Comment: Performed at [facility], [place] [address] [place], [place] [address]  Glucose, capillary     Status: Abnormal   Collection Time: 05/14/17  9:07 PM  Result Value Ref Range   Glucose-Capillary 362 (H) 65 - 99 mg/dL  Surgical PCR screen     Status: None   Collection Time: 05/15/17  5:31 AM  Result Value Ref Range  MRSA, PCR NEGATIVE NEGATIVE   Staphylococcus aureus NEGATIVE NEGATIVE    Comment: (NOTE) The Xpert SA Assay (FDA approved for NASAL specimens in patients 72 years of age and older), is one component of a comprehensive surveillance program. It is not intended to diagnose infection nor to guide or monitor treatment. Performed at [facility], [address] [place], [place] [address]   Glucose, capillary     Status: Abnormal   Collection Time: 05/15/17  7:37 AM  Result Value Ref Range   Glucose-Capillary 267 (H) 65 - 99 mg/dL   Comment 1 Notify RN   Glucose, capillary     Status: Abnormal   Collection Time: 05/15/17 11:36 AM  Result Value Ref Range   Glucose-Capillary 142 (H) 65 - 99 mg/dL   Comment 1 Notify RN   Aerobic/Anaerobic Culture (surgical/deep wound)     Status: None   Collection Time: 05/15/17  1:51 PM  Result Value Ref Range   Specimen Description      BONE Performed at [facility], [address] [place], [place] [address]    Special Requests      LEFT GREAT TOE BONE Performed at [facility], [place], [place] [address]    Gram Stain      FEW WBC PRESENT, PREDOMINANTLY MONONUCLEAR NO ORGANISMS SEEN    Culture      RARE VIRIDANS STREPTOCOCCUS NO ANAEROBES ISOLATED CRITICAL RESULT CALLED TO, READ BACK BY AND VERIFIED WITH: K CHAVIS,RN AT 1553 05/18/17 BY L BENFIELD CONCERNING GROWTH ON CULTURE Performed at [facility] [place], [place] [address], [place], [place] [address]    Report Status 05/20/2017 FINAL    Organism ID, Bacteria VIRIDANS STREPTOCOCCUS       Susceptibility   Viridans streptococcus - MIC*    PENICILLIN <=0.06 SENSITIVE Sensitive     CEFTRIAXONE <=0.12 SENSITIVE Sensitive     ERYTHROMYCIN 1 RESISTANT Resistant     LEVOFLOXACIN 0.5 SENSITIVE Sensitive     VANCOMYCIN 0.5 SENSITIVE Sensitive     * RARE VIRIDANS STREPTOCOCCUS  Surgical pathology     Status: None   Collection Time: 05/15/17  1:52 PM  Result Value Ref Range   SURGICAL PATHOLOGY      Surgical Pathology CASE: [id number] PATIENT: Jinger Neighbors Surgical Pathology Report     SPECIMEN SUBMITTED: A. Great toe, left  CLINICAL HISTORY: None provided  PRE-OPERATIVE DIAGNOSIS: Gangrene with osteomyelitis left great toe  POST-OPERATIVE DIAGNOSIS: Same as pre-op     DIAGNOSIS: A. GREAT TOE, LEFT; AMPUTATION: - ULCERATION, SOFT TISSUE NECROSIS, AND OSTEOMYELITIS. - PROXIMAL SOFT TISSUE MARGIN APPEARS VIABLE, WITH FOCAL ACUTE INFLAMMATION. - BONE MARGIN IS VIABLE AND NOT INFLAMED.   GROSS DESCRIPTION: A. Labeled: left great toe Size: 5.3 x 3.7 x 2.5 cm Description of lesion: 4.3 x 4.5 cm centrally disrupted ulcer at the tip surrounding the nail and with surrounding gray discolored skin Proximal margin: skin discoloration grossly abutting margin Bone: bone margin is a smooth cut surface, margin inked blue Other findings: received in formalin  Block summary: 1-perpendicular skin margin 2-bone at margin en face following decalcification  3-representative ulcer and underlying bone following decalcification  Final Diagnosis performed by Bryan Lemma, MD.  Electronically signed 05/18/2017 4:54:41PM    The electronic signature indicates that the named Attending Pathologist has evaluated the specimen  Technical component performed at [facility], [address], [place], [place] [address] Lab: [phone] Dir: Rush Farmer,  MD, MMM  Professional component performed at  [place], [facility], [address], [place], [place] [address] Lab: [phone] Dir: Dellia Nims. Rubinas, MD    Glucose, capillary     Status: Abnormal   Collection Time: 05/15/17  2:31 PM  Result Value Ref Range   Glucose-Capillary 108 (H) 65 - 99 mg/dL  Glucose, capillary     Status: Abnormal   Collection Time: 05/15/17  5:17 PM  Result Value Ref Range   Glucose-Capillary 149 (H) 65 - 99 mg/dL   Comment 1 Notify RN   Glucose, capillary     Status: Abnormal   Collection Time: 05/15/17  9:05 PM  Result Value Ref Range   Glucose-Capillary 289 (H) 65 - 99 mg/dL  Glucose, capillary     Status: Abnormal   Collection Time: 05/16/17 12:36 AM  Result Value Ref Range   Glucose-Capillary 192 (H) 65 - 99 mg/dL  CBC     Status: Abnormal   Collection Time: 05/16/17  6:39 AM  Result Value Ref Range   WBC 10.2 3.6 - 11.0 K/uL   RBC 4.66 3.80 - 5.20 MIL/uL   Hemoglobin 11.3 (L) 12.0 - 16.0 g/dL   HCT 34.9 (L) 35.0 - 47.0 %   MCV 74.9 (L) 80.0 - 100.0 fL   MCH 24.2 (L) 26.0 - 34.0 pg   MCHC 32.4 32.0 - 36.0 g/dL   RDW 16.7 (H) 11.5 - 14.5 %   Platelets 347 150 - 440 K/uL    Comment: Performed at  [facility], [address] [place], [place] [address]  Basic metabolic panel     Status: Abnormal   Collection Time: 05/16/17  6:39 AM  Result Value Ref Range   Sodium 137 135 - 145 mmol/L   Potassium 4.6 3.5 - 5.1 mmol/L   Chloride 103 101 - 111 mmol/L   CO2 27 22 - 32 mmol/L   Glucose, Bld 173 (H) 65 - 99 mg/dL   BUN 15 6 - 20 mg/dL   Creatinine, Ser 1.29 (H) 0.44 - 1.00 mg/dL   Calcium 8.8 (L) 8.9 - 10.3 mg/dL   GFR calc non Af Amer 43 (L) >60 mL/min   GFR calc Af Amer 50 (L) >60 mL/min    Comment: (NOTE) The eGFR has been calculated using the CKD EPI equation. This calculation has not been validated in all clinical situations. eGFR's persistently <60 mL/min signify possible Chronic Kidney Disease.     Anion gap 7 5 - 15    Comment: Performed at [facility], [address] [place],  [address]  Glucose, capillary     Status: Abnormal   Collection Time: 05/16/17  7:40 AM  Result Value Ref Range   Glucose-Capillary 164 (H) 65 - 99 mg/dL   Comment 1 Notify RN   Glucose, capillary     Status: Abnormal   Collection Time: 05/16/17 12:11 PM  Result Value Ref Range   Glucose-Capillary 243 (H) 65 - 99 mg/dL   Comment 1 Notify RN   Glucose, capillary     Status: Abnormal   Collection Time: 05/16/17  5:17 PM  Result Value Ref Range   Glucose-Capillary 251 (H) 65 - 99 mg/dL  Glucose, capillary     Status: Abnormal   Collection Time: 05/16/17  9:14 PM  Result Value Ref Range   Glucose-Capillary 148 (H) 65 - 99 mg/dL  Basic metabolic panel     Status: Abnormal   Collection Time: 05/17/17  4:48 AM  Result Value Ref Range  Sodium 143 135 - 145 mmol/L   Potassium 4.4 3.5 - 5.1 mmol/L   Chloride 107 101 - 111 mmol/L   CO2 28 22 - 32 mmol/L   Glucose, Bld 160 (H) 65 - 99 mg/dL   BUN 19 6 - 20 mg/dL   Creatinine, Ser 1.29 (H) 0.44 - 1.00 mg/dL   Calcium 8.9 8.9 - 10.3 mg/dL   GFR calc non Af Amer 43 (L) >60 mL/min   GFR calc Af Amer 50 (L) >60 mL/min    Comment: (NOTE) The eGFR has been calculated using the CKD EPI equation. This calculation has not been validated in all clinical situations. eGFR's persistently <60 mL/min signify possible Chronic Kidney Disease.    Anion gap 8 5 - 15    Comment: Performed at [facility], [address] [place], [place] [address]  Glucose, capillary     Status: Abnormal   Collection Time: 05/17/17  7:45 AM  Result Value Ref Range   Glucose-Capillary 166 (H) 65 - 99 mg/dL  Vancomycin, trough     Status: Abnormal   Collection Time: 05/17/17  9:01 AM  Result Value Ref Range   Vancomycin Tr 12 (L) 15 - 20 ug/mL    Comment: Performed at [facility], [address] [place], [place] [address]  Glucose, capillary      Status: Abnormal   Collection Time: 05/17/17 11:45 AM  Result Value Ref Range   Glucose-Capillary 193 (H) 65 - 99 mg/dL  BUN     Status: Abnormal   Collection Time: 05/23/17  8:50 AM  Result Value Ref Range   BUN 30 (H) 6 - 20 mg/dL    Comment: Performed at [facility], [address] [place], [place] [address]  Creatinine, serum     Status: Abnormal   Collection Time: 05/23/17  8:50 AM  Result Value Ref Range   Creatinine, Ser 1.43 (H) 0.44 - 1.00 mg/dL   GFR calc non Af Amer 38 (L) >60 mL/min   GFR calc Af Amer 44 (L) >60 mL/min    Comment: (NOTE) The eGFR has been calculated using the CKD EPI equation. This calculation has not been validated in all clinical situations. eGFR's persistently <60 mL/min signify possible Chronic Kidney Disease. Performed at [facility], [address] [place],  [address]   Glucose, capillary     Status: Abnormal   Collection Time: 05/23/17  9:03 AM  Result Value Ref Range   Glucose-Capillary 129 (H) 65 - 99 mg/dL  Glucose, capillary     Status: Abnormal   Collection Time: 05/28/17 11:13 AM  Result Value Ref Range   Glucose-Capillary 159 (H) 65 - 99 mg/dL  Glucose, capillary     Status: Abnormal   Collection Time: 05/28/17  3:18 PM  Result Value Ref Range   Glucose-Capillary 150 (H) 65 - 99 mg/dL    Radiology    Assessment/Plan  HTN (hypertension) blood pressure control important in reducing the progression of atherosclerotic disease. On appropriate oral medications.   DM2 (diabetes mellitus, type 2) blood glucose control important in reducing the progression of atherosclerotic disease. Also, involved in wound healing. On appropriate medications.   Tobacco abuse We had a discussion for approximately 3 minutes regarding the absolute need for smoking cessation due to the deleterious nature of tobacco on the vascular system. We discussed the tobacco use would diminish patency of any intervention, and likely  significantly worsen progressio of disease. We discussed multiple agents for  quitting including replacement therapy or medications to reduce cravings such as Chantix. The patient voices their understanding of the importance of smoking cessation.   Atherosclerosis of native arteries of the extremities with ulceration ([place]) Her noninvasive studies today demonstrate stable, significantly reduced right ABI and 0.44 range.  This was previously 0.5.  Her left ABI is markedly improved and now measures in the normal 1.0 range.  Her waveforms on the left are excellent as well. Before we consider any revascularization on the right side, it would be great if we can get her left foot to completely heal.  I will give her about 6 weeks and see her back in the office to then discuss this.  She does have claudication symptoms on the right but no open ulcerations or infection currently.  She will continue her current medical regimen.    [name], MD  06/19/2017 3:23 PM    This note was created with Dragon medical transcription system.  Any errors from dictation are purely unintentional

## 2017-06-19 NOTE — Assessment & Plan Note (Signed)
blood pressure control important in reducing the progression of atherosclerotic disease. On appropriate oral medications.  

## 2017-06-19 NOTE — Assessment & Plan Note (Signed)
blood glucose control important in reducing the progression of atherosclerotic disease. Also, involved in wound healing. On appropriate medications.  

## 2017-06-19 NOTE — Assessment & Plan Note (Signed)
Her noninvasive studies today demonstrate stable, significantly reduced right ABI and 0.44 range.  This was previously 0.5.  Her left ABI is markedly improved and now measures in the normal 1.0 range.  Her waveforms on the left are excellent as well. Before we consider any revascularization on the right side, it would be great if we can get her left foot to completely heal.  I will give her about 6 weeks and see her back in the office to then discuss this.  She does have claudication symptoms on the right but no open ulcerations or infection currently.  She will continue her current medical regimen.

## 2017-06-23 NOTE — Progress Notes (Signed)
Cardiology Office Note  Date:  06/25/2017   ID:  Melinda [name], DOB 05/21/1955, MRN [id number]  PCP:  Donnie Coffin, MD   Chief Complaint  Patient presents with  . Follow-up    6 month follow up. Patient c/o SOB. Meds reviewed verbally with patient.     HPI:  62 y.o. female with h/o CAD s/p 4 vessel CABG on 06/08/2014  poorly controlled IDDM,  HTN,  ongoing tobacco abuse, 2 cig  obesity,  anxiety,  peripheral neuropathy  admitted to [facility] from 2/1-06/03/14 for NSTEMI.  After undergoing cardiac cath on 2/3 that showed severe 3 vessel disease she was transferred to [facility] from 2/3-2/13 for . 4 vessel CABG (LIMA to LAD, SVG to Diagonal, SVG to OM, and SVG to PDA) She presents today for follow-up in the clinic for her coronary artery disease  In follow-up today she reports that she recently had toe amputation Complications from vascular surgery May 28, 2017 operative note from Dr. Lucky Cowboy Reporting PAD with ulceration and gangrene left foot Angioplasty of left SFA and popliteal artery Covered stent x2 to left SFA Angioplasty left posterior tibial artery  Left foot in a walking shoe She is concerned about her diabetes, reports hemoglobin A1c greater than 11 Unclear if she is being strict with her diet Long history of poorly controlled diabetes Reports she is compliant with her insulin, "numbers are just not coming down",  "Would like help"  Reports that she is down to 2 cigarettes every 2 days Previously did well on Chantix, requesting a new prescription  Denies any significant chest pain  EKG on today's visit shows normal sinus rhythm with rate 75 bpm, ST and T wave abnormality  unchanged from previous EKG   other past medical history   presented to [facility] ED on 2/1 with headache, neck pain, nasal pain, chest tightness, nausea, and just overall did not feel well. Work up in the [facility] ED confirmed sinus infection, which she started on Augmentin for. She was also found to be  hypotensive with BP 85/51. She received IV fluids with improvement in her BP to 120s/80s. Negative abdominal CT. She did have an elevated troponin at <0.02->1.30-->3.60-->7.80-->13.26-->23.00-->30.00. She was placed on a heparin gtt. Cardiac cath showed 95% stenosis mLAD, occlusion ostial OM1, 70% stenosis LCx, 95% stenosis mRCA, EF 45%. She underwent successful cabg on 06/08/2014 at [facility]. She tolerated this procedure well. Echo at [facility] showed EF 40-45%, HK of entire inferior and inferolateral wall c/w infarction of RCA or LCx, GR2DD, and mild TR. She was discharged on Lopressor 12.5 mg bid with labile BP. She was not started on an ACEi 2/2 labile BP. Hgb was stable at discharge. She was continued on Lasix 40 mg daily. She was advised to follow up with her PCP for her blood sugars (pre op A1C 10.5%).    PMH:   has a past medical history of Anxiety, Coronary artery disease, Diabetic neuropathy ([place]), GERD (gastroesophageal reflux disease), Heart attack ([place]), HTN (hypertension), IDDM (insulin dependent diabetes mellitus) ([place]), Ischemic cardiomyopathy, OA (osteoarthritis) of knee, Obesity, Osteoarthritis, Osteomyelitis ([place]), Peripheral neuropathy, Peripheral vascular disease ([place]), and Tobacco abuse.  PSH:    Past Surgical History:  Procedure Laterality Date  . ABDOMINAL AORTOGRAM W/LOWER EXTREMITY N/A 10/27/2016   Procedure: Abdominal Aortogram w/Lower Extremity;  Surgeon: Nelva Bush, MD;  Location: [place] [facility];  Service: Cardiovascular;  Laterality: N/A;  . AMPUTATION TOE Left 10/21/2015   Procedure: AMPUTATION TOE;  Surgeon: Sharlotte Alamo, DPM;  Location: [facility] OR[facility];  Service: Podiatry;  Laterality: Left;  . AMPUTATION TOE Left 05/15/2017   Procedure: AMPUTATION LEFT GREAT TOE;  Surgeon: Sharlotte Alamo, DPM;  Location: [facility] OR[facility];  Service: Podiatry;  Laterality: Left;  . AORTIC VALVE REPLACEMENT (AVR)/CORONARY ARTERY BYPASS GRAFTING (CABG)    . BREAST BIOPSY    . CARDIAC CATHETERIZATION  06/2014    95% stenosis mLAD, occlusion ostial OM1, 70% stenosis LCx, 95% stenosis mRCA, EF 45%.  . CORONARY ARTERY BYPASS GRAFT N/A 06/08/2014   Procedure: CORONARY ARTERY BYPASS GRAFTING (CABG);  Surgeon: Grace Isaac, MD;  Location: [place];  Service: Open Heart Surgery;  Laterality: N/A;  Times 4 using left internal mammary artery to LAD artery and endoscopically harvested bilateral saphenous vein to Obtuse Marginal, Diagonal and Posterior Descending coronary arteries.  . CT ABD W & PELVIS WO CM  06/2014   nl liver, gallbladder, spleen, mild diverticular changes, no bowel wall inflammation, appendix nl, no hernia, no other sig abnormalities  . LOWER EXTREMITY ANGIOGRAPHY Left 05/23/2017   Procedure: LOWER EXTREMITY ANGIOGRAPHY;  Surgeon: Algernon Huxley, MD;  Location: [place] [facility];  Service: Cardiovascular;  Laterality: Left;  . LOWER EXTREMITY ANGIOGRAPHY Left 05/28/2017   Procedure: LOWER EXTREMITY ANGIOGRAPHY;  Surgeon: Algernon Huxley, MD;  Location: [place] [facility];  Service: Cardiovascular;  Laterality: Left;  . TEE WITHOUT CARDIOVERSION N/A 06/08/2014   Procedure: TRANSESOPHAGEAL ECHOCARDIOGRAM (TEE);  Surgeon: Grace Isaac, MD;  Location: [place];  Service: Open Heart Surgery;  Laterality: N/A;  . TOE AMPUTATION Right 12/2011   rt middle toe  . US ECHOCARDIOGRAPHY  06/2014   EF 50-55%, HK of inf/post/inferolat walls, Ao sclerosis    Current Outpatient Medications  Medication Sig Dispense Refill  . aspirin 81 MG EC tablet Take 1 tablet (81 mg total) by mouth daily. 30 tablet 0  . atorvastatin (LIPITOR) 80 MG tablet Take 1 tablet (80 mg total) by mouth daily at 6 PM. 90 tablet 3  . clopidogrel (PLAVIX) 75 MG tablet Take 1 tablet (75 mg total) by mouth daily. 30 tablet 0  . DULoxetine (CYMBALTA) 30 MG capsule     . furosemide (LASIX) 20 MG tablet Take 1 tablet (20 mg total) by mouth daily as needed. (Patient taking differently: Take 20 mg by mouth daily as needed for fluid. ) 90 tablet 3   . gabapentin (NEURONTIN) 400 MG capsule Take 1,200 mg by mouth 3 (three) times daily.    . insulin aspart (NOVOLOG) 100 UNIT/ML injection Inject 3 Units into the skin 3 (three) times daily with meals. 10 mL 0  . insulin glargine (LANTUS) 100 UNIT/ML injection Inject 0.4 mLs (40 Units total) into the skin 2 (two) times daily. (Patient taking differently: Inject 60 Units into the skin 2 (two) times daily. ) 4 vial 1  . lisinopril (PRINIVIL,ZESTRIL) 40 MG tablet Take 1 tablet (40 mg total) by mouth daily. 90 tablet 3  . metoprolol tartrate (LOPRESSOR) 25 MG tablet Take 1 tablet (25 mg total) by mouth 2 (two) times daily. 180 tablet 3  . oxybutynin (DITROPAN) 5 MG tablet     . potassium chloride SA (K-DUR,KLOR-CON) 20 MEQ tablet Take 1 tablet (20 mEq total) by mouth daily as needed. (Patient taking differently: Take 20 mEq by mouth daily as needed (with Lasix). ) 90 tablet 3   No current facility-administered medications for this visit.      Allergies:   Patient has no known allergies.   Social  History:  The patient  reports that she has been smoking cigarettes.  She has a 10.00 pack-year smoking history. she has never used smokeless tobacco. She reports that she does not drink alcohol or use drugs.   Family History:   family history includes Asthma in her other; CAD in her father and mother; Diabetes in her father and mother; Hypertension in her mother; Sickle cell trait in her other.    Review of Systems: Review of Systems  Constitutional: Negative.   Respiratory: Negative.   Cardiovascular: Negative.   Gastrointestinal: Negative.   Musculoskeletal: Positive for back pain.       Pain in the left foot  Neurological: Negative.   Psychiatric/Behavioral: Negative.   All other systems reviewed and are negative.    PHYSICAL EXAM: VS:  BP 118/62 (BP Location: Left Arm, Patient Position: Sitting, Cuff Size: Large)   Pulse 75   Ht 5\' 8"  (1.727 m)   Wt 272 lb (123.4 kg)   BMI 41.36 kg/m   , BMI Body mass index is 41.36 kg/m. Constitutional:  oriented to person, place, and time. No distress.  HENT:  Head: Normocephalic and atraumatic.  Eyes:  no discharge. No scleral icterus.  Neck: Normal range of motion. Neck supple. No JVD present.  Cardiovascular: Normal rate, regular rhythm, normal heart sounds and intact distal pulses. Exam reveals no gallop and no friction rub. No edema No murmur heard. Pulmonary/Chest: Effort normal and breath sounds normal. No stridor. No respiratory distress.  no wheezes.  no rales.  no tenderness.  Abdominal: Soft.  no distension.  no tenderness.  Musculoskeletal: Normal range of motion.  no  tenderness or deformity.  Left foot in a walking shoe Neurological:  normal muscle tone. Coordination normal. No atrophy Skin: Skin is warm and dry. No rash noted. not diaphoretic.  Psychiatric:  normal mood and affect. behavior is normal. Thought content normal.    Recent Labs: 05/14/2017: ALT 13; Magnesium 1.7 05/16/2017: Hemoglobin 11.3; Platelets 347 05/17/2017: Potassium 4.4; Sodium 143 05/23/2017: BUN 30; Creatinine, Ser 1.43    Lipid Panel Lab Results  Component Value Date   CHOL 214 (H) 10/10/2016   HDL 42 10/10/2016   LDLCALC 103 (H) 10/10/2016   TRIG 346 (H) 10/10/2016      Wt Readings from Last 3 Encounters:  06/25/17 272 lb (123.4 kg)  06/19/17 273 lb (123.8 kg)  05/28/17 215 lb (97.5 kg)       ASSESSMENT AND PLAN:  Coronary artery disease involving native coronary artery of native heart without angina pectoris -  Stressed the importance of aggressive diabetes control Tolerating her Lipitor, antiplatelet therapy Stressed importance of smoking cessation  Essential hypertension -  Blood pressure is well controlled on today's visit. No changes made to the medications.  Ischemic cardiomyopathy -  Euvolemic, no further testing needed at this time  Claudication [facility]) -  Recent angiogram, angioplasty stent placement left lower  extremity for gangrene, with removal of toe   S/P CABG x 4 Currently with no symptoms of angina. No further workup at this time. Continue current medication regimen.  Stable  Type 2 diabetes mellitus with other circulatory complication, with long-term current use of insulin (HCC) Despite her best efforts hemoglobin A1c continues to run greater than 11 Reports she is compliant with her insulin and needs further assistance Reports that primary care is may no recent medication changes and she would like more aggressive care She is requesting referral to endocrinology  Tobacco abuse Smoking only  2 cigarettes every 2 days Chantix prescription has been provided This worked well for her in the past   Total encounter time more than 25 minutes  Greater than 50% was spent in counseling and coordination of care with the patient    Disposition:   F/U  6 months   Orders Placed This Encounter  Procedures  . EKG 12-Lead     Signed, Esmond Plants, M.D., Ph.D. 06/25/2017  [place], [place]

## 2017-06-25 ENCOUNTER — Encounter: Payer: Self-pay | Admitting: Cardiovascular Disease

## 2017-06-25 ENCOUNTER — Ambulatory Visit (INDEPENDENT_AMBULATORY_CARE_PROVIDER_SITE_OTHER): Payer: Medicare HMO | Admitting: Cardiovascular Disease

## 2017-06-25 VITALS — BP 118/62 | HR 75 | Ht 68.0 in | Wt 272.0 lb

## 2017-06-25 DIAGNOSIS — I10 Essential (primary) hypertension: Secondary | ICD-10-CM

## 2017-06-25 DIAGNOSIS — E1159 Type 2 diabetes mellitus with other circulatory complications: Secondary | ICD-10-CM

## 2017-06-25 DIAGNOSIS — I739 Peripheral vascular disease, unspecified: Secondary | ICD-10-CM | POA: Diagnosis not present

## 2017-06-25 DIAGNOSIS — Z794 Long term (current) use of insulin: Secondary | ICD-10-CM | POA: Diagnosis not present

## 2017-06-25 DIAGNOSIS — I70212 Atherosclerosis of native arteries of extremities with intermittent claudication, left leg: Secondary | ICD-10-CM

## 2017-06-25 DIAGNOSIS — E785 Hyperlipidemia, unspecified: Secondary | ICD-10-CM | POA: Diagnosis not present

## 2017-06-25 DIAGNOSIS — I255 Ischemic cardiomyopathy: Secondary | ICD-10-CM | POA: Diagnosis not present

## 2017-06-25 MED ORDER — VARENICLINE TARTRATE 1 MG PO TABS: 1.0000 mg | ORAL_TABLET | Freq: Two times a day (BID) | ORAL | 0 refills | Status: DC

## 2017-06-25 NOTE — Patient Instructions (Signed)
Medication Instructions:   No medication changes made  Try the chantix   Labwork:  No new labs needed  Testing/Procedures:  No further testing at this time   Follow-Up: It was a pleasure seeing you in the office today. Please call us if you have new issues that need to be addressed before your next appt.  [phone]  Your physician wants you to follow-up in: 12 months.  You will receive a reminder letter in the mail two months in advance. If you don't receive a letter, please call our office to schedule the follow-up appointment.  If you need a refill on your cardiac medications before your next appointment, please call your pharmacy.  For educational health videos Log in to : [email or web address] Or : [email or web address], password : [email or web address]

## 2017-07-11 ENCOUNTER — Ambulatory Visit: Payer: Medicare Other | Admitting: Internal Medicine

## 2017-07-31 ENCOUNTER — Ambulatory Visit (INDEPENDENT_AMBULATORY_CARE_PROVIDER_SITE_OTHER): Payer: Medicare HMO | Admitting: Vascular Surgery

## 2017-08-14 ENCOUNTER — Ambulatory Visit (INDEPENDENT_AMBULATORY_CARE_PROVIDER_SITE_OTHER): Payer: Medicare HMO | Admitting: Vascular Surgery

## 2017-08-25 ENCOUNTER — Other Ambulatory Visit: Payer: Self-pay | Admitting: Cardiovascular Disease

## 2017-08-27 NOTE — Telephone Encounter (Signed)
Please review for refill on Chantix.

## 2017-09-04 ENCOUNTER — Ambulatory Visit (INDEPENDENT_AMBULATORY_CARE_PROVIDER_SITE_OTHER): Payer: Medicare HMO | Admitting: Vascular Surgery

## 2017-11-16 ENCOUNTER — Telehealth: Payer: Self-pay | Admitting: Cardiovascular Disease

## 2017-11-16 NOTE — Telephone Encounter (Signed)
Patient has been having nose bleeds x 2 episodes and on plavix last episode 2 weeks ago   Patient reports copious amount of bleeding for an hour and passing clots   Please call to discuss and advise

## 2017-11-16 NOTE — Telephone Encounter (Signed)
Spoke with patient and she reports that she has had a couple of nose bleeds. She does not actively have one at this time. She just wanted to see what she should do if that happens again. Reviewed that she would need to hold pressure to her nose until it stops and if she is not able to stop the bleeding to go to ED for possible packing. Instructed her not to stop her medication. Instructed her to please call us back if this is a persistent problem for her and to try some humidification in her home to help reduce any dry air making it worse. She verbalized understanding of our conversation, agreement with plan, and had no further questions at this time.

## 2017-12-12 ENCOUNTER — Telehealth: Payer: Self-pay | Admitting: Cardiovascular Disease

## 2017-12-12 NOTE — Telephone Encounter (Signed)
Patient states Dr. Rockey Situ once prescribed Chantix - patient had stopped smoking but has recently started back - would like to know if she can get another prescription for Chantix Patient also is needing a letter describing her condition - stating she has a hardship Please call to discuss

## 2017-12-12 NOTE — Telephone Encounter (Signed)
S/w patient.  She says she was successful with quitting smoking with Chantix; however, she has friends that smoke and its been hard for her to stay quit but she wants to try again.  She also would like to know if Dr Rockey Situ would write a note saying she has been under health-related hardship since 2016. This is to help her with an outstanding debt that will be cancelled if she can show proof of hardship.   Advised I will route to Dr Rockey Situ for advice.

## 2017-12-13 NOTE — Telephone Encounter (Signed)
Ok to refill chantix Would recommend she contact PMD for a letter to help with debt relief,they could discuss diabetes and toe amputation

## 2017-12-14 MED ORDER — VARENICLINE TARTRATE 1 MG PO TABS: 1.0000 mg | ORAL_TABLET | Freq: Two times a day (BID) | ORAL | 0 refills | Status: DC

## 2017-12-14 MED ORDER — VARENICLINE TARTRATE 0.5 MG X 11 & 1 MG X 42 PO MISC: ORAL | 0 refills | Status: DC

## 2017-12-14 NOTE — Telephone Encounter (Signed)
No answer. Mailbox full and cannot accept messages at this time. 

## 2017-12-14 NOTE — Telephone Encounter (Signed)
Patient returning call about letter and chantix

## 2017-12-14 NOTE — Telephone Encounter (Signed)
Patient verbalized understanding to call PCP for a letter. She is appreciative that Dr Rockey Situ will refill Chantix. Rx for starter pack and one addition month sent to mail order pharmacy as requested by the patient.

## 2018-01-08 ENCOUNTER — Other Ambulatory Visit: Payer: Self-pay | Admitting: Cardiovascular Disease

## 2018-01-08 NOTE — Telephone Encounter (Signed)
Pam,   Will Dr. Rockey Situ want this refilled?

## 2018-01-08 NOTE — Telephone Encounter (Signed)
Please advise if ok to refill Chantix. 

## 2018-03-11 ENCOUNTER — Other Ambulatory Visit: Payer: Self-pay | Admitting: Cardiovascular Disease

## 2018-03-11 NOTE — Telephone Encounter (Signed)
Patient was told to try Chantix and is requesting another refill.  Please advise if okay to refill again.

## 2018-03-12 ENCOUNTER — Encounter: Payer: Self-pay | Admitting: Emergency Medicine

## 2018-03-12 ENCOUNTER — Emergency Department
Admission: EM | Admit: 2018-03-12 | Discharge: 2018-03-13 | Disposition: A | Discharge status: Other Acute Inpatient Hospital | Payer: Medicare HMO | Attending: Emergency Medicine | Admitting: Emergency Medicine

## 2018-03-12 ENCOUNTER — Emergency Department: Payer: Medicare HMO

## 2018-03-12 DIAGNOSIS — S0990XA Unspecified injury of head, initial encounter: Secondary | ICD-10-CM | POA: Insufficient documentation

## 2018-03-12 DIAGNOSIS — Z951 Presence of aortocoronary bypass graft: Secondary | ICD-10-CM | POA: Insufficient documentation

## 2018-03-12 DIAGNOSIS — Z7902 Long term (current) use of antithrombotics/antiplatelets: Secondary | ICD-10-CM | POA: Diagnosis not present

## 2018-03-12 DIAGNOSIS — I2510 Atherosclerotic heart disease of native coronary artery without angina pectoris: Secondary | ICD-10-CM | POA: Insufficient documentation

## 2018-03-12 DIAGNOSIS — Z79899 Other long term (current) drug therapy: Secondary | ICD-10-CM | POA: Insufficient documentation

## 2018-03-12 DIAGNOSIS — F332 Major depressive disorder, recurrent severe without psychotic features: Secondary | ICD-10-CM | POA: Diagnosis not present

## 2018-03-12 DIAGNOSIS — Z794 Long term (current) use of insulin: Secondary | ICD-10-CM | POA: Insufficient documentation

## 2018-03-12 DIAGNOSIS — Y929 Unspecified place or not applicable: Secondary | ICD-10-CM | POA: Diagnosis not present

## 2018-03-12 DIAGNOSIS — R45851 Suicidal ideations: Secondary | ICD-10-CM

## 2018-03-12 DIAGNOSIS — E1140 Type 2 diabetes mellitus with diabetic neuropathy, unspecified: Secondary | ICD-10-CM | POA: Insufficient documentation

## 2018-03-12 DIAGNOSIS — Y999 Unspecified external cause status: Secondary | ICD-10-CM | POA: Insufficient documentation

## 2018-03-12 DIAGNOSIS — Z7982 Long term (current) use of aspirin: Secondary | ICD-10-CM | POA: Diagnosis not present

## 2018-03-12 DIAGNOSIS — F1410 Cocaine abuse, uncomplicated: Secondary | ICD-10-CM

## 2018-03-12 DIAGNOSIS — W07XXXA Fall from chair, initial encounter: Secondary | ICD-10-CM | POA: Insufficient documentation

## 2018-03-12 DIAGNOSIS — W19XXXA Unspecified fall, initial encounter: Secondary | ICD-10-CM

## 2018-03-12 DIAGNOSIS — Y939 Activity, unspecified: Secondary | ICD-10-CM | POA: Insufficient documentation

## 2018-03-12 DIAGNOSIS — F17210 Nicotine dependence, cigarettes, uncomplicated: Secondary | ICD-10-CM | POA: Diagnosis not present

## 2018-03-12 DIAGNOSIS — R42 Dizziness and giddiness: Secondary | ICD-10-CM

## 2018-03-12 LAB — URINALYSIS, COMPLETE (UACMP) WITH MICROSCOPIC
Bilirubin Urine: NEGATIVE
Glucose, UA: NEGATIVE mg/dL
Hgb urine dipstick: NEGATIVE
Ketones, ur: NEGATIVE mg/dL
Leukocytes, UA: NEGATIVE
Nitrite: NEGATIVE
Protein, ur: NEGATIVE mg/dL
Specific Gravity, Urine: 1.015 (ref 1.005–1.030)
pH: 5 (ref 5.0–8.0)

## 2018-03-12 LAB — COMPREHENSIVE METABOLIC PANEL
ALT: 13 U/L (ref 0–44)
AST: 13 U/L — ABNORMAL LOW (ref 15–41)
Albumin: 3.2 g/dL — ABNORMAL LOW (ref 3.5–5.0)
Alkaline Phosphatase: 89 U/L (ref 38–126)
Anion gap: 7 (ref 5–15)
BUN: 39 mg/dL — ABNORMAL HIGH (ref 8–23)
CO2: 27 mmol/L (ref 22–32)
Calcium: 8.7 mg/dL — ABNORMAL LOW (ref 8.9–10.3)
Chloride: 106 mmol/L (ref 98–111)
Creatinine, Ser: 1.77 mg/dL — ABNORMAL HIGH (ref 0.44–1.00)
GFR calc Af Amer: 34 mL/min — ABNORMAL LOW (ref >60)
GFR calc non Af Amer: 29 mL/min — ABNORMAL LOW (ref >60)
Glucose, Bld: 133 mg/dL — ABNORMAL HIGH (ref 70–99)
Potassium: 5.1 mmol/L (ref 3.5–5.1)
Sodium: 140 mmol/L (ref 135–145)
Total Bilirubin: 0.5 mg/dL (ref 0.3–1.2)
Total Protein: 7.4 g/dL (ref 6.5–8.1)

## 2018-03-12 LAB — CBC WITH DIFFERENTIAL/PLATELET
Abs Immature Granulocytes: 0.06 10*3/uL (ref 0.00–0.07)
Basophils Absolute: 0.1 10*3/uL (ref 0.0–0.1)
Basophils Relative: 0 %
Eosinophils Absolute: 0.5 10*3/uL (ref 0.0–0.5)
Eosinophils Relative: 4 %
HCT: 30 % — ABNORMAL LOW (ref 36.0–46.0)
Hemoglobin: 9.1 g/dL — ABNORMAL LOW (ref 12.0–15.0)
Immature Granulocytes: 1 %
Lymphocytes Relative: 25 %
Lymphs Abs: 2.8 10*3/uL (ref 0.7–4.0)
MCH: 22 pg — ABNORMAL LOW (ref 26.0–34.0)
MCHC: 30.3 g/dL (ref 30.0–36.0)
MCV: 72.6 fL — ABNORMAL LOW (ref 80.0–100.0)
Monocytes Absolute: 1 10*3/uL (ref 0.1–1.0)
Monocytes Relative: 8 %
Neutro Abs: 7.2 10*3/uL (ref 1.7–7.7)
Neutrophils Relative %: 62 %
Platelets: 380 10*3/uL (ref 150–400)
RBC: 4.13 MIL/uL (ref 3.87–5.11)
RDW: 18.3 % — ABNORMAL HIGH (ref 11.5–15.5)
WBC: 11.5 10*3/uL — ABNORMAL HIGH (ref 4.0–10.5)
nRBC: 0 % (ref 0.0–0.2)

## 2018-03-12 LAB — URINE DRUG SCREEN, QUALITATIVE (ARMC ONLY)
Amphetamines, Ur Screen: NOT DETECTED
Barbiturates, Ur Screen: NOT DETECTED
Benzodiazepine, Ur Scrn: NOT DETECTED
Cannabinoid 50 Ng, Ur ~~LOC~~: NOT DETECTED
Cocaine Metabolite,Ur ~~LOC~~: POSITIVE — AB
MDMA (Ecstasy)Ur Screen: NOT DETECTED
Methadone Scn, Ur: NOT DETECTED
Opiate, Ur Screen: NOT DETECTED
Phencyclidine (PCP) Ur S: NOT DETECTED
Tricyclic, Ur Screen: NOT DETECTED

## 2018-03-12 LAB — GLUCOSE, CAPILLARY: Glucose-Capillary: 153 mg/dL — ABNORMAL HIGH (ref 70–99)

## 2018-03-12 MED ORDER — FUROSEMIDE 20 MG PO TABS
20.0000 mg | ORAL_TABLET | Freq: Every day | ORAL | Status: DC
  Filled 2018-03-12: qty 1

## 2018-03-12 MED ORDER — GABAPENTIN 300 MG PO CAPS
1200.0000 mg | ORAL_CAPSULE | Freq: Three times a day (TID) | ORAL | Status: DC
  Administered 2018-03-12: 1200 mg via ORAL
  Filled 2018-03-12: qty 4
  Filled 2018-03-12: qty 3

## 2018-03-12 MED ORDER — INSULIN GLARGINE 100 UNIT/ML ~~LOC~~ SOLN
60.0000 [IU] | Freq: Two times a day (BID) | SUBCUTANEOUS | Status: DC
  Administered 2018-03-12 – 2018-03-13 (×2): 60 [IU] via SUBCUTANEOUS
  Filled 2018-03-12 (×4): qty 0.6

## 2018-03-12 MED ORDER — LISINOPRIL 20 MG PO TABS
40.0000 mg | ORAL_TABLET | Freq: Every day | ORAL | Status: DC
  Filled 2018-03-12: qty 2

## 2018-03-12 MED ORDER — CLOPIDOGREL BISULFATE 75 MG PO TABS
75.0000 mg | ORAL_TABLET | Freq: Every day | ORAL | Status: DC
  Administered 2018-03-13: 75 mg via ORAL
  Filled 2018-03-12 (×2): qty 1

## 2018-03-12 MED ORDER — INSULIN ASPART 100 UNIT/ML ~~LOC~~ SOLN
3.0000 [IU] | Freq: Three times a day (TID) | SUBCUTANEOUS | Status: DC
  Administered 2018-03-13 (×2): 3 [IU] via SUBCUTANEOUS
  Filled 2018-03-12 (×2): qty 1

## 2018-03-12 MED ORDER — ATORVASTATIN CALCIUM 20 MG PO TABS: 80.0000 mg | ORAL_TABLET | Freq: Every day | ORAL | Status: DC

## 2018-03-12 MED ORDER — METOPROLOL TARTRATE 25 MG PO TABS
25.0000 mg | ORAL_TABLET | Freq: Two times a day (BID) | ORAL | Status: DC
  Administered 2018-03-12 – 2018-03-13 (×2): 25 mg via ORAL
  Filled 2018-03-12 (×2): qty 1

## 2018-03-12 MED ORDER — POTASSIUM CHLORIDE CRYS ER 20 MEQ PO TBCR
20.0000 meq | EXTENDED_RELEASE_TABLET | Freq: Every day | ORAL | Status: DC
  Administered 2018-03-13: 20 meq via ORAL
  Filled 2018-03-12: qty 1

## 2018-03-12 MED ORDER — ASPIRIN EC 81 MG PO TBEC
81.0000 mg | DELAYED_RELEASE_TABLET | Freq: Every day | ORAL | Status: DC
  Administered 2018-03-13: 81 mg via ORAL
  Filled 2018-03-12: qty 1

## 2018-03-12 NOTE — Consult Note (Signed)
Melinda [name]   Reason for [name]: [name] for 62 year old woman who comes to the emergency room complaining of depression and substance abuse Referring Physician: Rip Harbour Patient Identification: Melinda [name] MRN:  [id number] Principal Diagnosis: Severe recurrent major depression without psychotic features [facility]) Diagnosis:   Patient Active Problem List   Diagnosis Date Noted  . Severe recurrent major depression without psychotic features ([place]) [F33.2] 03/12/2018  . Cocaine abuse ([place]) [F14.10] 03/12/2018  . Suicidal ideation [R45.851] 03/12/2018  . Atherosclerosis of native arteries of the extremities with ulceration ([place]) [I70.25] 06/19/2017  . Gangrene of toe of left foot ([place]) [I96] 05/14/2017  . CAD (coronary artery disease), native coronary artery [I25.10] 05/05/2017  . Claudication in peripheral vascular disease ([place]) [I73.9] 10/18/2016  . Hyperlipidemia LDL goal <70 [E78.5] 10/18/2016  . Onychomycosis [B35.1] 10/10/2016  . Medication monitoring encounter [Z51.81] 10/10/2016  . Amputated toe, left ([place]) [J19.147W] 10/10/2016  . Amputated toe, right ([place]) [G95.621H] 10/10/2016  . Osteomyelitis of left foot ([place]) [M86.9] 10/21/2015  . Hyperglycemia [R73.9] 10/02/2015  . IDDM (insulin dependent diabetes mellitus) ([place]) [E11.9, Z79.4]   . Peripheral neuropathy [G62.9]   . Tobacco abuse [Z72.0]   . Obesity [E66.9]   . Ischemic cardiomyopathy [I25.5]   . 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 HTN (hypertension) [I10]   . DM2 (diabetes mellitus, type 2) ([place]) [E11.9]   . GERD (gastroesophageal reflux disease) [K21.9]   . NSTEMI (non-ST elevated myocardial infarction) ([place]) [I21.4] 06/03/2014    Total Time spent with patient: 1 hour  Subjective:   Melinda [name] is a 62 y.o. female patient admitted with "I could be better".  HPI: Patient seen chart reviewed.  62 year old woman came to the emergency room initially complaining of a fall but on further  evaluation stated that she was very depressed and abusing cocaine and felt that she needed hospitalization.  Patient states that her mood has been bad down and depressed for a couple months.  Feels negative about herself all the time.  Feels hopeless.  Ruminates on regrets about her life.  Sleeping poorly at night.  Appetite has been increased and she has been eating excessively.  Patient is using crack cocaine and it sounds like she is probably spending all of her check on it.  She said she had been staying clean for a while but relapsed a couple months ago.  She has been to her primary care doctor and it sounds like they have prescribed antidepressants for her but she has felt like they cause side effects or made her feel worse.  Patient is having frequent intrusive thoughts about death and dying.  Not taking care of her health very well.  No hallucinations or psychosis.  Social history: Patient lives by herself.  Has no children.  Says that her family "do not want to have anything to do with me".  Medical history: Multiple severe chronic medical problems.  Diabetes with multiple complications.  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  History of cardiomyopathy and hypertension  Substance abuse history: Patient says that she has used crack cocaine for years.  She managed to stop and was staying off it for some undefined amount of time but then a couple months ago she came under some bad influence and started using it again.  Sounds like she is pretty much using up her whole check.  She is not drinking and denies any other drug use.  Past Psychiatric  History: Patient says she had one inpatient hospitalization years ago.  I was not able to locate a record of that.  She says she was in the hospital for only 5 days.  Has been on antidepressants in the past but cannot remember what they were.  Patient has had some serious thoughts about trying to kill herself in the  past.  Has come close to suicide attempts in the past.  No history of violence no history of psychosis  Risk to Self:   Risk to Others:   Prior Inpatient Therapy:   Prior Outpatient Therapy:    Past Medical History:  Past Medical History:  Diagnosis Date  . Anxiety   . Coronary artery disease    a. NSTEMI 06/2014; b. 4 vessel CABG 06/08/2014 LIMA to LAD, SVG to Diagonal, SVG to OM, & SVG to PDA  . Diabetic neuropathy ([place])   . GERD (gastroesophageal reflux disease)   . Heart attack ([place])   . HTN (hypertension)   . IDDM (insulin dependent diabetes mellitus) ([place])   . Ischemic cardiomyopathy    a. echo 06/04/14 EF 40-45%, HK of entire inferolateral and inferior myocardium c.w infarct of RCA/LCx, GR2DD, mild MR  . OA (osteoarthritis) of knee   . Obesity   . Osteoarthritis   . Osteomyelitis ([place])   . Peripheral neuropathy   . Peripheral vascular disease ([place])   . Tobacco abuse     Past Surgical History:  Procedure Laterality Date  . ABDOMINAL AORTOGRAM W/LOWER EXTREMITY N/A 10/27/2016   Procedure: Abdominal Aortogram w/Lower Extremity;  Surgeon: Nelva Bush, MD;  Location: [place] [facility];  Service: Cardiovascular;  Laterality: N/A;  . AMPUTATION TOE Left 10/21/2015   Procedure: AMPUTATION TOE;  Surgeon: Sharlotte Alamo, DPM;  Location: [facility];  Service: Podiatry;  Laterality: Left;  . AMPUTATION TOE Left 05/15/2017   Procedure: AMPUTATION LEFT GREAT TOE;  Surgeon: Sharlotte Alamo, DPM;  Location: [facility];  Service: Podiatry;  Laterality: Left;  . AORTIC VALVE REPLACEMENT (AVR)/CORONARY ARTERY BYPASS GRAFTING (CABG)    . BREAST BIOPSY    . CARDIAC CATHETERIZATION  06/2014   95% stenosis mLAD, occlusion ostial OM1, 70% stenosis LCx, 95% stenosis mRCA, EF 45%.  . CORONARY ARTERY BYPASS GRAFT N/A 06/08/2014   Procedure: CORONARY ARTERY BYPASS GRAFTING (CABG);  Surgeon: Grace Isaac, MD;  Location: [facility];  Service: Open Heart Surgery;  Laterality: N/A;  Times 4 using left internal  mammary artery to LAD artery and endoscopically harvested bilateral saphenous vein to Obtuse Marginal, Diagonal and Posterior Descending coronary arteries.  . CT ABD W & PELVIS WO CM  06/2014   nl liver, gallbladder, spleen, mild diverticular changes, no bowel wall inflammation, appendix nl, no hernia, no other sig abnormalities  . LOWER EXTREMITY ANGIOGRAPHY Left 05/23/2017   Procedure: LOWER EXTREMITY ANGIOGRAPHY;  Surgeon: Algernon Huxley, MD;  Location: [facility];  Service: Cardiovascular;  Laterality: Left;  . LOWER EXTREMITY ANGIOGRAPHY Left 05/28/2017   Procedure: LOWER EXTREMITY ANGIOGRAPHY;  Surgeon: Algernon Huxley, MD;  Location: [facility];  Service: Cardiovascular;  Laterality: Left;  . TEE WITHOUT CARDIOVERSION N/A 06/08/2014   Procedure: TRANSESOPHAGEAL ECHOCARDIOGRAM (TEE);  Surgeon: Grace Isaac, MD;  Location: [facility];  Service: Open Heart Surgery;  Laterality: N/A;  . TOE AMPUTATION Right 12/2011   rt middle toe  . US ECHOCARDIOGRAPHY  06/2014   EF 50-55%, HK of inf/post/inferolat walls, Ao sclerosis   Family History:  Family History  Problem Relation Age of Onset  . CAD Mother   . Hypertension Mother   . Diabetes Mother   . CAD Father   . Diabetes Father   . Sickle cell trait Other   . Asthma Other    Family Psychiatric  History: None Social History:  Social History   Substance and Sexual Activity  Alcohol Use No  . Alcohol/week: 0.0 standard drinks     Social History   Substance and Sexual Activity  Drug Use Yes   Comment: crack    Social History   Socioeconomic History  . Marital status: Widowed    Spouse name: Not on file  . Number of children: Not on file  . Years of education: Not on file  . Highest education level: Not on file  Occupational History  . Not on file  Social Needs  . Financial resource strain: Not on file  . Food insecurity:    Worry: Not on file    Inability: Not on file  . Transportation needs:    Medical:  Not on file    Non-medical: Not on file  Tobacco Use  . Smoking status: Current Some Day Smoker    Packs/day: 0.00    Years: 40.00    Pack years: 0.00    Types: Cigarettes  . Smokeless tobacco: Never Used  Substance and Sexual Activity  . Alcohol use: No    Alcohol/week: 0.0 standard drinks  . Drug use: Yes    Comment: crack  . Sexual activity: Not on file  Lifestyle  . Physical activity:    Days per week: Not on file    Minutes per session: Not on file  . Stress: Not on file  Relationships  . Social connections:    Talks on phone: Not on file    Gets together: Not on file    Attends religious service: Not on file    Active member of club or organization: Not on file    Attends meetings of clubs or organizations: Not on file    Relationship status: Not on file  Other Topics Concern  . Not on file  Social History Narrative  . Not on file   Additional Social History:    Allergies:  No Known Allergies  Labs:  Results for orders placed or performed during the hospital encounter of 03/12/18 (from the past 48 hour(s))  Comprehensive metabolic panel     Status: Abnormal   Collection Time: 03/12/18  4:16 PM  Result Value Ref Range   Sodium 140 135 - 145 mmol/L   Potassium 5.1 3.5 - 5.1 mmol/L   Chloride 106 98 - 111 mmol/L   CO2 27 22 - 32 mmol/L   Glucose, Bld 133 (H) 70 - 99 mg/dL   BUN 39 (H) 8 - 23 mg/dL   Creatinine, Ser 1.77 (H) 0.44 - 1.00 mg/dL   Calcium 8.7 (L) 8.9 - 10.3 mg/dL   Total Protein 7.4 6.5 - 8.1 g/dL   Albumin 3.2 (L) 3.5 - 5.0 g/dL   AST 13 (L) 15 - 41 U/L   ALT 13 0 - 44 U/L   Alkaline Phosphatase 89 38 - 126 U/L   Total Bilirubin 0.5 0.3 - 1.2 mg/dL   GFR calc non Af Amer 29 (L) >60 mL/min   GFR calc Af Amer 34 (L) >60 mL/min    Comment: (NOTE) The eGFR has been calculated using the CKD EPI equation. This calculation has not been validated in all clinical  situations. eGFR's persistently <60 mL/min signify possible Chronic  Kidney Disease.    Anion gap 7 5 - 15    Comment: Performed at [facility], [address] [place], [place] [address]  CBC with Differential     Status: Abnormal   Collection Time: 03/12/18  4:16 PM  Result Value Ref Range   WBC 11.5 (H) 4.0 - 10.5 K/uL   RBC 4.13 3.87 - 5.11 MIL/uL   Hemoglobin 9.1 (L) 12.0 - 15.0 g/dL   HCT 30.0 (L) 36.0 - 46.0 %   MCV 72.6 (L) 80.0 - 100.0 fL   MCH 22.0 (L) 26.0 - 34.0 pg   MCHC 30.3 30.0 - 36.0 g/dL   RDW 18.3 (H) 11.5 - 15.5 %   Platelets 380 150 - 400 K/uL   nRBC 0.0 0.0 - 0.2 %   Neutrophils Relative % 62 %   Neutro Abs 7.2 1.7 - 7.7 K/uL   Lymphocytes Relative 25 %   Lymphs Abs 2.8 0.7 - 4.0 K/uL   Monocytes Relative 8 %   Monocytes Absolute 1.0 0.1 - 1.0 K/uL   Eosinophils Relative 4 %   Eosinophils Absolute 0.5 0.0 - 0.5 K/uL   Basophils Relative 0 %   Basophils Absolute 0.1 0.0 - 0.1 K/uL   Immature Granulocytes 1 %   Abs Immature Granulocytes 0.06 0.00 - 0.07 K/uL    Comment: Performed at [facility], [address] [place], [place] [address]    No current facility-administered medications for this encounter.    Current Outpatient Medications  Medication Sig Dispense Refill  . aspirin 81 MG EC tablet Take 1 tablet (81 mg total) by mouth daily. 30 tablet 0  . atorvastatin (LIPITOR) 80 MG tablet Take 1 tablet (80 mg total) by mouth daily at 6 PM. 90 tablet 3  . clopidogrel (PLAVIX) 75 MG tablet Take 1 tablet (75 mg total) by mouth daily. 30 tablet 0  . DULoxetine (CYMBALTA) 30 MG capsule     . furosemide (LASIX) 20 MG tablet Take 1 tablet (20 mg total) by mouth daily as needed. (Patient taking differently: Take 20 mg by mouth daily as needed for fluid. ) 90 tablet 3  . gabapentin (NEURONTIN) 400 MG capsule Take 1,200 mg by mouth 3 (three) times daily.    . insulin aspart (NOVOLOG) 100 UNIT/ML injection Inject 3 Units into the skin 3 (three) times daily with meals. 10 mL 0  . insulin glargine (LANTUS) 100  UNIT/ML injection Inject 0.4 mLs (40 Units total) into the skin 2 (two) times daily. (Patient taking differently: Inject 60 Units into the skin 2 (two) times daily. ) 4 vial 1  . lisinopril (PRINIVIL,ZESTRIL) 40 MG tablet Take 1 tablet (40 mg total) by mouth daily. 90 tablet 3  . metoprolol tartrate (LOPRESSOR) 25 MG tablet Take 1 tablet (25 mg total) by mouth 2 (two) times daily. 180 tablet 3  . oxybutynin (DITROPAN) 5 MG tablet     . potassium chloride SA (K-DUR,KLOR-CON) 20 MEQ tablet Take 1 tablet (20 mEq total) by mouth daily as needed. (Patient taking differently: Take 20 mEq by mouth daily as needed (with Lasix). ) 90 tablet 3  . varenicline (CHANTIX CONTINUING MONTH PAK) 1 MG tablet Take 1 tablet (1 mg total) by mouth 2 (two) times daily. 60 tablet 1  . varenicline (CHANTIX STARTING MONTH PAK) 0.5 MG X 11 & 1 MG X 42 tablet Take one 0.5 mg tablet by mouth once daily for  3 days,then take one 0.5 mg tablet twice daily for 4 days,then take 1 mg tablet twice daily. 53 tablet 0    Musculoskeletal: Strength & Muscle Tone: decreased Gait & Station: unsteady Patient leans: N/A  Psychiatric Specialty Exam: Physical Exam  Nursing note and vitals reviewed. Constitutional: She appears well-developed and well-nourished.  HENT:  Head: Normocephalic and atraumatic.  Eyes: Pupils are equal, round, and reactive to light. Conjunctivae are normal.  Neck: Normal range of motion.  Cardiovascular: Regular rhythm and normal heart sounds.  Respiratory: Effort normal. No respiratory distress.  GI: Soft.  Musculoskeletal: Normal range of motion.  Neurological: She is alert.  Patient has chronic weakness and neuropathy in lower extremities and has had some surgery causing her to be at least partially off balance walking  Skin: Skin is warm and dry.  Psychiatric: Her speech is normal. She is not agitated and not aggressive. Thought content is not paranoid. Cognition and memory are normal. She expresses  impulsivity. She exhibits a depressed mood. She expresses suicidal ideation. She expresses suicidal plans.    Review of Systems  Constitutional: Negative.   HENT: Negative.   Eyes: Negative.   Respiratory: Negative.   Cardiovascular: Negative.   Gastrointestinal: Negative.   Musculoskeletal: Negative.   Skin: Negative.   Neurological: Negative.   Psychiatric/Behavioral: Positive for depression, substance abuse and suicidal ideas. Negative for hallucinations and memory loss. The patient is nervous/anxious and has insomnia.     Blood pressure 137/66, pulse 71, temperature 97.8 F (36.6 C), temperature source Oral, resp. rate 14, height 5' 8"  (1.727 m), weight 115.7 kg, SpO2 100 %.Body mass index is 38.77 kg/m.  General Appearance: Casual  Eye Contact:  Good  Speech:  Normal Rate  Volume:  Normal  Mood:  Depressed  Affect:  Congruent  Thought Process:  Goal Directed  Orientation:  Full (Time, Place, and Person)  Thought Content:  Logical and Rumination  Suicidal Thoughts:  Yes.  with intent/plan  Homicidal Thoughts:  No  Memory:  Immediate;   Fair Recent;   Fair Remote;   Fair  Judgement:  Fair  Insight:  Fair  Psychomotor Activity:  Decreased  Concentration:  Concentration: Fair  Recall:  [employer] of Knowledge:  Fair  Language:  Fair  Akathisia:  No  Handed:  Right  AIMS (if indicated):     Assets:  Communication Skills Desire for Improvement Financial Resources/Insurance  ADL's:  Impaired  Cognition:  WNL  Sleep:        Treatment Plan Summary: Daily contact with patient to assess and evaluate symptoms and progress in treatment, Medication management and Plan 62 year old woman with major depression and substance abuse.  Feeling hopeless helpless having intrusive thoughts about death and dying or hurting herself.  Patient is requesting admission to the inpatient psychiatric unit and does not feel safe going home.  I think it is appropriate to admit her to the  inpatient unit for stabilization.  Because of her medical problems I have asked the nursing staff on the psychiatric ward to assess the patient as to whether she would be appropriate for our unit.  If so we can place orders to admit her here if not we can consider referral to geriatric psych.  Meanwhile continue her usual medications for stabilization of her multiple medical problems.  Supportive counseling review of treatment plan with patient TTS and emergency room doctor.  Disposition: Recommend psychiatric Inpatient admission when medically cleared.  Alethia Berthold, MD 03/12/2018 6:06 PM

## 2018-03-12 NOTE — ED Notes (Addendum)
Dressed out by this RN in dress out room, socks, shoes, pants, underwear, shirt and cell phone. Report to Amy in [place].  Moved to [employer] via wheelchair by Allayne Butcher, RN charge aware.

## 2018-03-12 NOTE — ED Notes (Signed)
Hourly rounding reveals patient sleeping in room. No complaints, stable, in no acute distress. Q15 minute rounds and monitoring via Security Cameras to continue. 

## 2018-03-12 NOTE — ED Notes (Signed)
ED Provider at bedside. 

## 2018-03-12 NOTE — ED Provider Notes (Signed)
Patient seen by Ashok Cordia, [name].  Her CT and L-spine are negative.  Patient says she is feeling depressed and ashamed.  She has not used cocaine for couple days but wants to come in the hospital because she says she needs help.  She is not suicidal or homicidal at this point.  I have consulted TTS and psychiatry.   Nena Polio, MD 03/12/18 [phone]

## 2018-03-12 NOTE — ED Notes (Signed)
Pt given meal tray-states that it is cold, will order another.

## 2018-03-12 NOTE — ED Notes (Signed)
Pt given snack. 

## 2018-03-12 NOTE — ED Notes (Signed)
Pt presentation discussed with EDP, Siadecki.  See new orders.

## 2018-03-12 NOTE — ED Notes (Signed)
Given warm meal tray.

## 2018-03-12 NOTE — ED Notes (Signed)
Psychiatrist at bedside

## 2018-03-12 NOTE — ED Notes (Signed)
Report to include Situation, Background, Assessment, and Recommendations received from Amy B. RN. Patient alert and oriented, warm and dry, in no acute distress. Patient denies SI, HI, AVH and pain. Patient made aware of Q15 minute rounds and security cameras for their safety. Patient instructed to come to me with needs or concerns. 

## 2018-03-12 NOTE — ED Notes (Signed)
See triage note  Presents with pain to head s/p fall 2 months ago  States she sat down in a chair  And fell backwards hit the floor denies any LOC but states she was dazed  Also conts to have intermittent dizziness

## 2018-03-12 NOTE — ED Notes (Signed)
Pt able to stand and ambulate. Pt able to dress herself out. Pt lives at home alone and does all of her ADL's independently.

## 2018-03-12 NOTE — ED Notes (Signed)
Informed the provider that she wants to find help for crack use  States she has not used today but states if she goes home she will use

## 2018-03-12 NOTE — ED Notes (Signed)
Hourly rounding reveals patient in room. No complaints, stable, in no acute distress. Q15 minute rounds and monitoring via Security Cameras to continue. 

## 2018-03-12 NOTE — ED Triage Notes (Signed)
Pt in via POV, reports mechanical fall 9/13, falling backwards, hitting head on kitchen floor.  Pt was not seen at time of incident.  Pt reports ongoing pain and headache to posterior head since the fall along with intermittent dizziness that has not yet resolved.   Pt is on Plavix, denies LOC at time of fall.  Vitals WDL.  NAD noted at this time.

## 2018-03-12 NOTE — ED Provider Notes (Signed)
[facility] Emergency Department Provider Note  ____________________________________________   First MD Initiated Contact with Patient 03/12/18 1423     (approximate)  I have reviewed the triage vital signs and the nursing notes.   HISTORY  Chief Complaint Fall    HPI Melinda [name] is a 62 y.o. female states she had a fall on September 13 and hit her head.  She states she sat down in a chair and chair fell backwards and she hit the floor.  She is complaining of lower back pain and headaches.  She is also had dizziness since the fall.  She currently takes Plavix.  She denies any nausea or vomiting.  She states when she lies on her right side that the room spins. Upon further questioning the patient starts to cry and states she is really ashamed to be here but she thinks that she needs to "go downstairs ".  She states that "may not look like it but I am smoking crack ".  She states she has not smoked in the last 24 hours but that she does need to come in and get help.  She denies SI/HI.    Past Medical History:  Diagnosis Date  . Anxiety   . Coronary artery disease    a. NSTEMI 06/2014; b. 4 vessel CABG 06/08/2014 LIMA to LAD, SVG to Diagonal, SVG to OM, & SVG to PDA  . Diabetic neuropathy ([place])   . GERD (gastroesophageal reflux disease)   . Heart attack ([place])   . HTN (hypertension)   . IDDM (insulin dependent diabetes mellitus) ([place])   . Ischemic cardiomyopathy    a. echo 06/04/14 EF 40-45%, HK of entire inferolateral and inferior myocardium c.w infarct of RCA/LCx, GR2DD, mild MR  . OA (osteoarthritis) of knee   . Obesity   . Osteoarthritis   . Osteomyelitis ([place])   . Peripheral neuropathy   . Peripheral vascular disease ([place])   . Tobacco abuse     Patient Active Problem List   Diagnosis Date Noted  . Atherosclerosis of native arteries of the extremities with ulceration ([place]) 06/19/2017  . Gangrene of toe of left foot ([place]) 05/14/2017  . CAD  (coronary artery disease), native coronary artery 05/05/2017  . Claudication in peripheral vascular disease ([place]) 10/18/2016  . Hyperlipidemia LDL goal <70 10/18/2016  . Onychomycosis 10/10/2016  . Medication monitoring encounter 10/10/2016  . Amputated toe, left ([place]) 10/10/2016  . Amputated toe, right ([place]) 10/10/2016  . Osteomyelitis of left foot ([place]) 10/21/2015  . Hyperglycemia 10/02/2015  . IDDM (insulin dependent diabetes mellitus) ([place])   . Peripheral neuropathy   . Tobacco abuse   . Obesity   . Ischemic cardiomyopathy   . 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 HTN (hypertension)   . DM2 (diabetes mellitus, type 2) ([place])   . GERD (gastroesophageal reflux disease)   . NSTEMI (non-ST elevated myocardial infarction) ([place]) 06/03/2014    Past Surgical History:  Procedure Laterality Date  . ABDOMINAL AORTOGRAM W/LOWER EXTREMITY N/A 10/27/2016   Procedure: Abdominal Aortogram w/Lower Extremity;  Surgeon: Nelva Bush, MD;  Location: [place] [facility];  Service: Cardiovascular;  Laterality: N/A;  . AMPUTATION TOE Left 10/21/2015   Procedure: AMPUTATION TOE;  Surgeon: Sharlotte Alamo, DPM;  Location: [facility];  Service: Podiatry;  Laterality: Left;  . AMPUTATION TOE Left 05/15/2017   Procedure: AMPUTATION LEFT GREAT TOE;  Surgeon: Sharlotte Alamo, DPM;  Location: [facility];  Service: Podiatry;  Laterality: Left;  .  AORTIC VALVE REPLACEMENT (AVR)/CORONARY ARTERY BYPASS GRAFTING (CABG)    . BREAST BIOPSY    . CARDIAC CATHETERIZATION  06/2014   95% stenosis mLAD, occlusion ostial OM1, 70% stenosis LCx, 95% stenosis mRCA, EF 45%.  . CORONARY ARTERY BYPASS GRAFT N/A 06/08/2014   Procedure: CORONARY ARTERY BYPASS GRAFTING (CABG);  Surgeon: Grace Isaac, MD;  Location: [place];  Service: Open Heart Surgery;  Laterality: N/A;  Times 4 using left internal mammary artery to LAD artery and endoscopically harvested bilateral saphenous vein to Obtuse Marginal, Diagonal and Posterior Descending coronary arteries.    . CT ABD W & PELVIS WO CM  06/2014   nl liver, gallbladder, spleen, mild diverticular changes, no bowel wall inflammation, appendix nl, no hernia, no other sig abnormalities  . LOWER EXTREMITY ANGIOGRAPHY Left 05/23/2017   Procedure: LOWER EXTREMITY ANGIOGRAPHY;  Surgeon: Algernon Huxley, MD;  Location: [place] [facility];  Service: Cardiovascular;  Laterality: Left;  . LOWER EXTREMITY ANGIOGRAPHY Left 05/28/2017   Procedure: LOWER EXTREMITY ANGIOGRAPHY;  Surgeon: Algernon Huxley, MD;  Location: [place] [facility];  Service: Cardiovascular;  Laterality: Left;  . TEE WITHOUT CARDIOVERSION N/A 06/08/2014   Procedure: TRANSESOPHAGEAL ECHOCARDIOGRAM (TEE);  Surgeon: Grace Isaac, MD;  Location: [facility];  Service: Open Heart Surgery;  Laterality: N/A;  . TOE AMPUTATION Right 12/2011   rt middle toe  . US ECHOCARDIOGRAPHY  06/2014   EF 50-55%, HK of inf/post/inferolat walls, Ao sclerosis    Prior to Admission medications   Medication Sig Start Date End Date Taking? Authorizing Provider  aspirin 81 MG EC tablet Take 1 tablet (81 mg total) by mouth daily. 05/17/17   Loletha Grayer, MD  atorvastatin (LIPITOR) 80 MG tablet Take 1 tablet (80 mg total) by mouth daily at 6 PM. 05/11/17   Gollan, Kathlene November, MD  clopidogrel (PLAVIX) 75 MG tablet Take 1 tablet (75 mg total) by mouth daily. 05/17/17   Loletha Grayer, MD  DULoxetine (CYMBALTA) 30 MG capsule  05/14/17   [provider]  furosemide (LASIX) 20 MG tablet Take 1 tablet (20 mg total) by mouth daily as needed. Patient taking differently: Take 20 mg by mouth daily as needed for fluid.  05/11/17 05/11/18  Minna Merritts, MD  gabapentin (NEURONTIN) 400 MG capsule Take 1,200 mg by mouth 3 (three) times daily.    [provider]  insulin aspart (NOVOLOG) 100 UNIT/ML injection Inject 3 Units into the skin 3 (three) times daily with meals. 05/17/17   Loletha Grayer, MD  insulin glargine (LANTUS) 100 UNIT/ML injection Inject 0.4 mLs (40  Units total) into the skin 2 (two) times daily. Patient taking differently: Inject 60 Units into the skin 2 (two) times daily.  05/17/17   Loletha Grayer, MD  lisinopril (PRINIVIL,ZESTRIL) 40 MG tablet Take 1 tablet (40 mg total) by mouth daily. 05/11/17   Minna Merritts, MD  metoprolol tartrate (LOPRESSOR) 25 MG tablet Take 1 tablet (25 mg total) by mouth 2 (two) times daily. 05/11/17   Minna Merritts, MD  oxybutynin (DITROPAN) 5 MG tablet  05/10/17   [provider]  potassium chloride SA (K-DUR,KLOR-CON) 20 MEQ tablet Take 1 tablet (20 mEq total) by mouth daily as needed. Patient taking differently: Take 20 mEq by mouth daily as needed (with Lasix).  05/11/17 05/11/18  Minna Merritts, MD  varenicline (CHANTIX CONTINUING MONTH PAK) 1 MG tablet Take 1 tablet (1 mg total) by mouth 2 (two) times daily. 03/12/18  Minna Merritts, MD  varenicline (CHANTIX STARTING MONTH PAK) 0.5 MG X 11 & 1 MG X 42 tablet Take one 0.5 mg tablet by mouth once daily for 3 days,then take one 0.5 mg tablet twice daily for 4 days,then take 1 mg tablet twice daily. 12/14/17   Minna Merritts, MD    Allergies Patient has no known allergies.  Family History  Problem Relation Age of Onset  . CAD Mother   . Hypertension Mother   . Diabetes Mother   . CAD Father   . Diabetes Father   . Sickle cell trait Other   . Asthma Other     Social History Social History   Tobacco Use  . Smoking status: Current Some Day Smoker    Packs/day: 0.00    Years: 40.00    Pack years: 0.00    Types: Cigarettes  . Smokeless tobacco: Never Used  Substance Use Topics  . Alcohol use: No    Alcohol/week: 0.0 standard drinks  . Drug use: No    Review of Systems  Constitutional: No fever/chills, positive for dizziness Eyes: No visual changes. ENT: No sore throat. Respiratory: Denies cough Genitourinary: Negative for dysuria. Musculoskeletal: positive for back pain. Skin: Negative for rash. Psych: asking for  help "to get off of crack"   ____________________________________________   PHYSICAL EXAM:  VITAL SIGNS: ED Triage Vitals  Enc Vitals Group     BP 03/12/18 1249 (!) 132/58     Pulse Rate 03/12/18 1249 89     Resp 03/12/18 1249 16     Temp 03/12/18 1249 97.8 F (36.6 C)     Temp Source 03/12/18 1249 Oral     SpO2 --      Weight 03/12/18 1250 255 lb (115.7 kg)     Height 03/12/18 1250 5\' 8"  (1.727 m)     Head Circumference --      Peak Flow --      Pain Score 03/12/18 1249 5     Pain Loc --      Pain Edu? --      Excl. in [place]? --     Constitutional: Alert and oriented. Well appearing and in no acute distress. Eyes: Conjunctivae are normal.  Head: Atraumatic. Nose: No congestion/rhinnorhea. Mouth/Throat: Mucous membranes are moist.   Neck:  supple no lymphadenopathy noted, no cervical tenderness noted Cardiovascular: Normal rate, regular rhythm. Heart sounds are normal Respiratory: Normal respiratory effort.  No retractions, lungs c t a  GU: deferred Musculoskeletal: FROM all extremities, warm and well perfused. Lumbar spine is mildly tender, no other spinal tenderness noted Neurologic:  Normal speech and language.  Skin:  Skin is warm, dry and intact. No rash noted. Psychiatric: Mood and affect are normal. Speech and behavior are normal. Pt is tearful while talking about drug abuse  ____________________________________________   LABS (all labs ordered are listed, but only abnormal results are displayed)  Labs Reviewed - No data to display ____________________________________________   ____________________________________________  RADIOLOGY  CT of the head is negative Lumbar spine is negative for fracture  ____________________________________________   PROCEDURES  Procedure(s) performed: No  Procedures    ____________________________________________   INITIAL IMPRESSION / ASSESSMENT AND PLAN / ED COURSE  Pertinent labs & imaging results that were  available during my care of the patient were reviewed by me and considered in my medical decision making (see chart for details).   Patient 62 year old female presents emergency department after a fall in September.  She  fell and hit her head.  Patient is on Plavix.  She is can continue to complain of headaches and dizziness.  Upon further questioning the patient states she also wants to be of admitted for her drug abuse problem.  On physical exam she appears well.  Lumbar spine is mildly tender.  Remainder the exam is unremarkable  CT the head is negative X-ray of the lumbar spine is negative  Patient was advised of the test results.  She will be transferred to the main ED for evaluation of her drug abuse.     As part of my medical decision making, I reviewed the following data within the [place] notes reviewed and incorporated, Old chart reviewed, Radiograph reviewed CT of the head is negative, x-ray lumbar spine is negative, Notes from prior ED visits and [place] Controlled Substance Database  ____________________________________________   FINAL CLINICAL IMPRESSION(S) / ED DIAGNOSES  Final diagnoses:  Fall, initial encounter  Minor head injury, initial encounter  Dizziness      NEW MEDICATIONS STARTED DURING THIS VISIT:  New Prescriptions   No medications on file     Note:  This document was prepared using Dragon voice recognition software and may include unintentional dictation errors.    Versie Starks, [name] 03/12/18 1547    Arta Silence, MD 03/12/18 [phone]

## 2018-03-12 NOTE — ED Notes (Signed)
Pt oriented to unit and given a drink of water.   Maintained on 15 minute checks and observation by security camera for safety.

## 2018-03-13 ENCOUNTER — Inpatient Hospital Stay
Admission: AD | Admit: 2018-03-13 | Discharge: 2018-03-21 | DRG: 885 | Disposition: A | Discharge status: Home / Self Care | Payer: Medicare HMO | Source: Intra-hospital | Attending: Psychiatry | Admitting: Psychiatry

## 2018-03-13 ENCOUNTER — Other Ambulatory Visit: Payer: Self-pay

## 2018-03-13 ENCOUNTER — Encounter: Payer: Self-pay | Admitting: Psychiatry

## 2018-03-13 DIAGNOSIS — Z833 Family history of diabetes mellitus: Secondary | ICD-10-CM

## 2018-03-13 DIAGNOSIS — Z638 Other specified problems related to primary support group: Secondary | ICD-10-CM

## 2018-03-13 DIAGNOSIS — R42 Dizziness and giddiness: Secondary | ICD-10-CM | POA: Diagnosis not present

## 2018-03-13 DIAGNOSIS — I959 Hypotension, unspecified: Secondary | ICD-10-CM | POA: Diagnosis not present

## 2018-03-13 DIAGNOSIS — E1151 Type 2 diabetes mellitus with diabetic peripheral angiopathy without gangrene: Secondary | ICD-10-CM | POA: Diagnosis present

## 2018-03-13 DIAGNOSIS — Z952 Presence of prosthetic heart valve: Secondary | ICD-10-CM | POA: Diagnosis not present

## 2018-03-13 DIAGNOSIS — F17210 Nicotine dependence, cigarettes, uncomplicated: Secondary | ICD-10-CM | POA: Diagnosis present

## 2018-03-13 DIAGNOSIS — Z7902 Long term (current) use of antithrombotics/antiplatelets: Secondary | ICD-10-CM | POA: Diagnosis not present

## 2018-03-13 DIAGNOSIS — F419 Anxiety disorder, unspecified: Secondary | ICD-10-CM | POA: Diagnosis present

## 2018-03-13 DIAGNOSIS — I7025 Atherosclerosis of native arteries of other extremities with ulceration: Secondary | ICD-10-CM | POA: Diagnosis present

## 2018-03-13 DIAGNOSIS — E1142 Type 2 diabetes mellitus with diabetic polyneuropathy: Secondary | ICD-10-CM | POA: Diagnosis present

## 2018-03-13 DIAGNOSIS — E785 Hyperlipidemia, unspecified: Secondary | ICD-10-CM | POA: Diagnosis present

## 2018-03-13 DIAGNOSIS — Z89422 Acquired absence of other left toe(s): Secondary | ICD-10-CM | POA: Diagnosis not present

## 2018-03-13 DIAGNOSIS — F332 Major depressive disorder, recurrent severe without psychotic features: Secondary | ICD-10-CM | POA: Diagnosis present

## 2018-03-13 DIAGNOSIS — Z7982 Long term (current) use of aspirin: Secondary | ICD-10-CM

## 2018-03-13 DIAGNOSIS — E1159 Type 2 diabetes mellitus with other circulatory complications: Secondary | ICD-10-CM

## 2018-03-13 DIAGNOSIS — E119 Type 2 diabetes mellitus without complications: Secondary | ICD-10-CM | POA: Diagnosis not present

## 2018-03-13 DIAGNOSIS — I2510 Atherosclerotic heart disease of native coronary artery without angina pectoris: Secondary | ICD-10-CM | POA: Diagnosis present

## 2018-03-13 DIAGNOSIS — K219 Gastro-esophageal reflux disease without esophagitis: Secondary | ICD-10-CM | POA: Diagnosis present

## 2018-03-13 DIAGNOSIS — I10 Essential (primary) hypertension: Secondary | ICD-10-CM | POA: Diagnosis present

## 2018-03-13 DIAGNOSIS — Z89421 Acquired absence of other right toe(s): Secondary | ICD-10-CM

## 2018-03-13 DIAGNOSIS — Z79899 Other long term (current) drug therapy: Secondary | ICD-10-CM

## 2018-03-13 DIAGNOSIS — Z794 Long term (current) use of insulin: Secondary | ICD-10-CM

## 2018-03-13 DIAGNOSIS — Z915 Personal history of self-harm: Secondary | ICD-10-CM

## 2018-03-13 DIAGNOSIS — Z8249 Family history of ischemic heart disease and other diseases of the circulatory system: Secondary | ICD-10-CM | POA: Diagnosis not present

## 2018-03-13 DIAGNOSIS — S0990XA Unspecified injury of head, initial encounter: Secondary | ICD-10-CM | POA: Diagnosis not present

## 2018-03-13 DIAGNOSIS — I255 Ischemic cardiomyopathy: Secondary | ICD-10-CM | POA: Diagnosis present

## 2018-03-13 DIAGNOSIS — Z951 Presence of aortocoronary bypass graft: Secondary | ICD-10-CM

## 2018-03-13 DIAGNOSIS — F1410 Cocaine abuse, uncomplicated: Secondary | ICD-10-CM | POA: Diagnosis present

## 2018-03-13 DIAGNOSIS — I252 Old myocardial infarction: Secondary | ICD-10-CM

## 2018-03-13 DIAGNOSIS — R45851 Suicidal ideations: Secondary | ICD-10-CM | POA: Diagnosis present

## 2018-03-13 LAB — GLUCOSE, CAPILLARY
Glucose-Capillary: 192 mg/dL — ABNORMAL HIGH (ref 70–99)
Glucose-Capillary: 133 mg/dL — ABNORMAL HIGH (ref 70–99)
Glucose-Capillary: 174 mg/dL — ABNORMAL HIGH (ref 70–99)
Glucose-Capillary: 111 mg/dL — ABNORMAL HIGH (ref 70–99)

## 2018-03-13 MED ORDER — INSULIN GLARGINE 100 UNIT/ML ~~LOC~~ SOLN
60.0000 [IU] | Freq: Two times a day (BID) | SUBCUTANEOUS | Status: DC
  Administered 2018-03-13 – 2018-03-21 (×16): 60 [IU] via SUBCUTANEOUS
  Filled 2018-03-13 (×18): qty 0.6

## 2018-03-13 MED ORDER — ASPIRIN EC 81 MG PO TBEC
81.0000 mg | DELAYED_RELEASE_TABLET | Freq: Every day | ORAL | Status: DC
  Administered 2018-03-14 – 2018-03-21 (×8): 81 mg via ORAL
  Filled 2018-03-13 (×8): qty 1

## 2018-03-13 MED ORDER — FUROSEMIDE 20 MG PO TABS
20.0000 mg | ORAL_TABLET | Freq: Every day | ORAL | Status: DC
  Administered 2018-03-13 – 2018-03-15 (×3): 20 mg via ORAL
  Filled 2018-03-13 (×4): qty 1

## 2018-03-13 MED ORDER — ALUM & MAG HYDROXIDE-SIMETH 200-200-20 MG/5ML PO SUSP: 30.0000 mL | ORAL | Status: DC | PRN

## 2018-03-13 MED ORDER — TRAZODONE HCL 100 MG PO TABS
100.0000 mg | ORAL_TABLET | Freq: Every evening | ORAL | Status: DC | PRN
  Administered 2018-03-13 – 2018-03-19 (×5): 100 mg via ORAL
  Filled 2018-03-13 (×6): qty 1

## 2018-03-13 MED ORDER — GABAPENTIN 400 MG PO CAPS
400.0000 mg | ORAL_CAPSULE | Freq: Three times a day (TID) | ORAL | Status: DC
  Administered 2018-03-13: 400 mg via ORAL
  Filled 2018-03-13: qty 1
  Filled 2018-03-13: qty 4
  Filled 2018-03-13 (×2): qty 1

## 2018-03-13 MED ORDER — INSULIN ASPART 100 UNIT/ML ~~LOC~~ SOLN
3.0000 [IU] | Freq: Three times a day (TID) | SUBCUTANEOUS | Status: DC
  Administered 2018-03-13 – 2018-03-21 (×24): 3 [IU] via SUBCUTANEOUS
  Filled 2018-03-13 (×15): qty 1

## 2018-03-13 MED ORDER — POTASSIUM CHLORIDE CRYS ER 20 MEQ PO TBCR: 20.0000 meq | EXTENDED_RELEASE_TABLET | Freq: Every day | ORAL | Status: DC

## 2018-03-13 MED ORDER — CLOPIDOGREL BISULFATE 75 MG PO TABS
75.0000 mg | ORAL_TABLET | Freq: Every day | ORAL | Status: DC
  Administered 2018-03-14 – 2018-03-21 (×8): 75 mg via ORAL
  Filled 2018-03-13 (×8): qty 1

## 2018-03-13 MED ORDER — ACETAMINOPHEN 325 MG PO TABS
650.0000 mg | ORAL_TABLET | Freq: Four times a day (QID) | ORAL | Status: DC | PRN
  Administered 2018-03-16 – 2018-03-21 (×10): 650 mg via ORAL
  Filled 2018-03-13 (×10): qty 2

## 2018-03-13 MED ORDER — DOUBLE ANTIBIOTIC 500-10000 UNIT/GM EX OINT
1.0000 "application " | TOPICAL_OINTMENT | Freq: Every day | CUTANEOUS | Status: DC
  Administered 2018-03-13: 1 via TOPICAL
  Filled 2018-03-13: qty 28.4

## 2018-03-13 MED ORDER — METOPROLOL TARTRATE 25 MG PO TABS
25.0000 mg | ORAL_TABLET | Freq: Two times a day (BID) | ORAL | Status: DC
  Administered 2018-03-13 – 2018-03-15 (×3): 25 mg via ORAL
  Filled 2018-03-13 (×4): qty 1

## 2018-03-13 MED ORDER — LISINOPRIL 20 MG PO TABS
40.0000 mg | ORAL_TABLET | Freq: Every day | ORAL | Status: DC
  Administered 2018-03-13: 40 mg via ORAL
  Filled 2018-03-13: qty 2

## 2018-03-13 MED ORDER — LOPERAMIDE HCL 2 MG PO CAPS
4.0000 mg | ORAL_CAPSULE | Freq: Once | ORAL | Status: AC
  Administered 2018-03-13: 4 mg via ORAL
  Filled 2018-03-13 (×2): qty 2

## 2018-03-13 MED ORDER — ATORVASTATIN CALCIUM 20 MG PO TABS
80.0000 mg | ORAL_TABLET | Freq: Every day | ORAL | Status: DC
  Administered 2018-03-13 – 2018-03-20 (×8): 80 mg via ORAL
  Filled 2018-03-13 (×8): qty 4

## 2018-03-13 MED ORDER — MAGNESIUM HYDROXIDE 400 MG/5ML PO SUSP
30.0000 mL | Freq: Every day | ORAL | Status: DC | PRN
  Administered 2018-03-15: 30 mL via ORAL
  Filled 2018-03-13: qty 30

## 2018-03-13 NOTE — Tx Team (Signed)
Initial Treatment Plan 03/13/2018 5:40 PM Melinda [name] MLJ:[id number]    PATIENT STRESSORS: Health problems Substance abuse Other: depression   PATIENT STRENGTHS: Ability for insight Active sense of humor Average or above average intelligence Capable of independent living Communication skills General fund of knowledge Motivation for treatment/growth Special hobby/interest Supportive family/friends   PATIENT IDENTIFIED PROBLEMS: Substance abuse  depression  stress                 DISCHARGE CRITERIA:  Ability to meet basic life and health needs Improved stabilization in mood, thinking, and/or behavior Medical problems require only outpatient monitoring Need for constant or close observation no longer present Verbal commitment to aftercare and medication compliance  PRELIMINARY DISCHARGE PLAN: Outpatient therapy Return to previous living arrangement  PATIENT/FAMILY INVOLVEMENT: This treatment plan has been presented to and reviewed with the patient, Melinda [name]  The patient has been given the opportunity to ask questions and make suggestions.  Reyes Ivan, RN 03/13/2018, 5:40 PM

## 2018-03-13 NOTE — ED Notes (Signed)
Pt. C/o loose stools.

## 2018-03-13 NOTE — ED Notes (Signed)
Hourly rounding reveals patient in room. No complaints, stable, in no acute distress. Q15 minute rounds and monitoring via Security Cameras to continue. 

## 2018-03-13 NOTE — ED Notes (Signed)
Patient ate 100% of lunch, and beverage, no signs of distress.

## 2018-03-13 NOTE — ED Notes (Signed)
Hourly rounding reveals patient sleeping in room. No complaints, stable, in no acute distress. Q15 minute rounds and monitoring via Security Cameras to continue. 

## 2018-03-13 NOTE — ED Notes (Signed)
Hourly rounding reveals patient in rest room. No complaints, stable, in no acute distress. Q15 minute rounds and monitoring via Security Cameras to continue. 

## 2018-03-13 NOTE — BH Assessment (Signed)
Patient is to be admitted to [facility] by Dr. Weber Cooks.  Attending Physician will be Dr. Einar Grad.   Patient has been assigned to room 305, by [facility] Charge Nurse [place].   Intake Paper Work has been signed and placed on patient chart.  ER staff is aware of the admission:  Lattie Haw, ER Secretary    Dr. Reita Cliche, ER MD   Abigail Butts, Patient's Nurse   Elberta Fortis, Patient Access.

## 2018-03-13 NOTE — BH Assessment (Signed)
TTS reviewed pt with BMU - Dr. Wonda Olds. Waiting for pt to be reviewed by [facility]

## 2018-03-13 NOTE — ED Notes (Signed)
Pt was given breakfast tray. Pt states does not eat pork we called dinning services, and they will bring a alternative for her diet.

## 2018-03-13 NOTE — Plan of Care (Signed)
Patient just recently admitted and has not had sufficient time to show progressions. Will continue to monitor for progressions.    Problem: Coping: Goal: Coping ability will improve Outcome: Not Progressing Goal: Will verbalize feelings Outcome: Not Progressing   Problem: Health Behavior/Discharge Planning: Goal: Ability to make decisions will improve Outcome: Not Progressing Goal: Compliance with therapeutic regimen will improve Outcome: Not Progressing   Problem: Role Relationship: Goal: Will demonstrate positive changes in social behaviors and relationships Outcome: Not Progressing   Problem: Safety: Goal: Ability to disclose and discuss suicidal ideas will improve Outcome: Not Progressing Goal: Ability to identify and utilize support systems that promote safety will improve Outcome: Not Progressing   Problem: Self-Concept: Goal: Will verbalize positive feelings about self Outcome: Not Progressing   Problem: Health Behavior/Discharge Planning: Goal: Ability to identify changes in lifestyle to reduce recurrence of condition will improve Outcome: Not Progressing   Problem: Physical Regulation: Goal: Complications related to the disease process, condition or treatment will be avoided or minimized Outcome: Not Progressing   Problem: Safety: Goal: Ability to remain free from injury will improve Outcome: Not Progressing

## 2018-03-13 NOTE — ED Provider Notes (Signed)
-----------------------------------------   6:40 AM on 03/13/2018 -----------------------------------------   Blood pressure (!) 107/53, pulse 81, temperature 98.2 F (36.8 C), temperature source Oral, resp. rate 16, height 5\' 8"  (1.727 m), weight 115.7 kg, SpO2 100 %.  The patient had no acute events since last update.  Calm and cooperative at this time.  Disposition is pending Psychiatry/Behavioral Medicine team recommendations.     Paulette Blanch, MD 03/13/18 [phone]

## 2018-03-13 NOTE — ED Notes (Signed)
Patient with wound to left lateral foot, she states it is a burn that she obtained when she feel in the kitchen, nurse applied polymycin and band-aid.

## 2018-03-13 NOTE — ED Notes (Signed)
Nurse called [facility] to verify gabapentin dose amount that she takes daily which is 1200 mg TID.

## 2018-03-13 NOTE — Progress Notes (Addendum)
D: Received patient from [facility] Emergency Department with report given from Abigail Butts, RN at 14:20 and patient received at 1525. Patient skin assessment completed with Mechele Claude RN, skin is intact, with the exception of: bilateral scabbing on the shins, a large open wound on the side of the left foot that is present upon admission with a bandage, 3 large closed scabs on the right foot/right big toe. Pt. Skin is also very dry and flaky. Pt. Also presents to the unit with history of several toe amputations making her a high fall risk. Pt. In her history also presented to the ED with prior recent fall at home. No contraband found during search with all unit prohibited items stored away for discharge. Pt. Was admitted under the services of, Dr. Einar Grad.   Pt. Upon admissions was guarded and a little irritable, but shortly after being introduced to the unit began to open up and was pleasant and cooperative during the admissions process. Pt. Reports she came to the emergency department after a serious fall at home that left her with a serious, "burn on my left foot, see it's bandaged". Pt. Also after the fall, besides being checked up from the fall, reported she wanted to come to the hospital as well for help with substance abuse (cocaine) and major depression she has been feeling. Pt. Denies SI/HI/AVH, contracts for safety. Pt. Denies pain, despite wound on foot. Orders received for wound consult and PT evaluation for patient difficulty with ambulation and reported, "burn".    A: Patient oriented to unit/room/call light. Patient was encourage to participate in unit activities and continue with plan of care. Q x 15 minute observation checks were initiated for safety. Pt. Given extensive admissions education and high falls risk safety education. Pt. Verbalizes understandings of education provided. Pt. Instructed to utilize wheelchair for mobility due to history of falls and unsteady history.  R:  Patient is receptive to treatment plan initiated with the patient's direct input. Pt. Safety to be maintained per MD orders.

## 2018-03-13 NOTE — ED Notes (Signed)
Patient is alert and oriented, nurse reported to [employer] in the BMU, sll patient's belongings taken with patient, patient transferred in w/c to BMU. Patient is going to be admitted to room 305.
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
## 2018-03-14 DIAGNOSIS — F1410 Cocaine abuse, uncomplicated: Secondary | ICD-10-CM

## 2018-03-14 DIAGNOSIS — E119 Type 2 diabetes mellitus without complications: Secondary | ICD-10-CM

## 2018-03-14 DIAGNOSIS — I10 Essential (primary) hypertension: Secondary | ICD-10-CM

## 2018-03-14 DIAGNOSIS — R45851 Suicidal ideations: Secondary | ICD-10-CM

## 2018-03-14 LAB — TSH: TSH: 0.545 u[IU]/mL (ref 0.350–4.500)

## 2018-03-14 LAB — URINE DRUG SCREEN, QUALITATIVE (ARMC ONLY)
Amphetamines, Ur Screen: NOT DETECTED
Barbiturates, Ur Screen: NOT DETECTED
Benzodiazepine, Ur Scrn: NOT DETECTED
Cannabinoid 50 Ng, Ur ~~LOC~~: NOT DETECTED
Cocaine Metabolite,Ur ~~LOC~~: NOT DETECTED
MDMA (Ecstasy)Ur Screen: NOT DETECTED
Methadone Scn, Ur: NOT DETECTED
Opiate, Ur Screen: NOT DETECTED
Phencyclidine (PCP) Ur S: NOT DETECTED
Tricyclic, Ur Screen: NOT DETECTED

## 2018-03-14 LAB — GLUCOSE, CAPILLARY
Glucose-Capillary: 115 mg/dL — ABNORMAL HIGH (ref 70–99)
Glucose-Capillary: 141 mg/dL — ABNORMAL HIGH (ref 70–99)
Glucose-Capillary: 140 mg/dL — ABNORMAL HIGH (ref 70–99)
Glucose-Capillary: 113 mg/dL — ABNORMAL HIGH (ref 70–99)

## 2018-03-14 LAB — BASIC METABOLIC PANEL
Anion gap: 6 (ref 5–15)
BUN: 32 mg/dL — ABNORMAL HIGH (ref 8–23)
CO2: 26 mmol/L (ref 22–32)
Calcium: 8.4 mg/dL — ABNORMAL LOW (ref 8.9–10.3)
Chloride: 111 mmol/L (ref 98–111)
Creatinine, Ser: 1.38 mg/dL — ABNORMAL HIGH (ref 0.44–1.00)
GFR calc Af Amer: 46 mL/min — ABNORMAL LOW (ref >60)
GFR calc non Af Amer: 39 mL/min — ABNORMAL LOW (ref >60)
Glucose, Bld: 122 mg/dL — ABNORMAL HIGH (ref 70–99)
Potassium: 4.5 mmol/L (ref 3.5–5.1)
Sodium: 143 mmol/L (ref 135–145)

## 2018-03-14 LAB — URINALYSIS, COMPLETE (UACMP) WITH MICROSCOPIC
Bilirubin Urine: NEGATIVE
Glucose, UA: NEGATIVE mg/dL
Hgb urine dipstick: NEGATIVE
Ketones, ur: NEGATIVE mg/dL
Leukocytes, UA: NEGATIVE
Nitrite: NEGATIVE
Protein, ur: NEGATIVE mg/dL
Specific Gravity, Urine: 1.01 (ref 1.005–1.030)
pH: 5 (ref 5.0–8.0)

## 2018-03-14 LAB — HEMOGLOBIN A1C
Hgb A1c MFr Bld: 7.9 % — ABNORMAL HIGH (ref 4.8–5.6)
Mean Plasma Glucose: 180.03 mg/dL

## 2018-03-14 LAB — LIPID PANEL
Cholesterol: 92 mg/dL (ref 0–200)
HDL: 39 mg/dL — ABNORMAL LOW (ref >40)
LDL Cholesterol: 39 mg/dL (ref 0–99)
Total CHOL/HDL Ratio: 2.4 RATIO
Triglycerides: 71 mg/dL (ref <150)
VLDL: 14 mg/dL (ref 0–40)

## 2018-03-14 MED ORDER — LISINOPRIL 20 MG PO TABS
20.0000 mg | ORAL_TABLET | Freq: Every day | ORAL | Status: DC
  Administered 2018-03-16: 20 mg via ORAL
  Filled 2018-03-14: qty 1

## 2018-03-14 MED ORDER — GABAPENTIN 100 MG PO CAPS
100.0000 mg | ORAL_CAPSULE | Freq: Three times a day (TID) | ORAL | Status: DC
  Administered 2018-03-14 – 2018-03-15 (×2): 100 mg via ORAL
  Filled 2018-03-14 (×2): qty 1

## 2018-03-14 MED ORDER — GABAPENTIN 400 MG PO CAPS
800.0000 mg | ORAL_CAPSULE | Freq: Once | ORAL | Status: AC
  Administered 2018-03-14: 800 mg via ORAL
  Filled 2018-03-14: qty 2

## 2018-03-14 MED ORDER — COLLAGENASE 250 UNIT/GM EX OINT
TOPICAL_OINTMENT | Freq: Every day | CUTANEOUS | Status: DC
  Administered 2018-03-14: 15:00:00 via TOPICAL
  Administered 2018-03-15 – 2018-03-16 (×2): 1 via TOPICAL
  Administered 2018-03-17 – 2018-03-21 (×5): via TOPICAL
  Filled 2018-03-14: qty 30

## 2018-03-14 NOTE — BHH Suicide Risk Assessment (Signed)
[place] INPATIENT:  Family/Significant Other Suicide Prevention Education  Suicide Prevention Education:  Patient Refusal for Family/Significant Other Suicide Prevention Education: The patient Melinda [name] has refused to provide written consent for family/significant other to be provided Family/Significant Other Suicide Prevention Education during admission and/or prior to discharge.  Physician notified.  Darin Engels 03/14/2018, 2:25 PM

## 2018-03-14 NOTE — Evaluation (Signed)
Physical Therapy Evaluation Patient Details Name: Melinda [name] MRN: [id number] DOB: 09-12-1955 Today's Date: 03/14/2018   History of Present Illness  62 y/o female in behavior health unit with depression, had fall 2 months ago (9/12), hit head and has been having ongoing dizziness and headache.    Clinical Impression  Pt pleasant and willing to work with PT.  She has been in w/c most of the time in behavioral med.  Per today's performance she was safe to do some limited ambulation with FWW, however she c/o b/l LE weakness and tightness in LEs with "prolonged" ambulation (intermittent claudication-type symptoms).   She had no LOBs with ~200 ft but likely could not have done too much more w/o difficulty. Instructed nursing to try to encourage regular small bouts of ambulation and no just stay in the w/c.  She was negative for Dix-Hallpike Maneuver despite some subjectively described issues that would make this a reasonable possibility (positional, short duration, traumatic head strike).    Follow Up Recommendations (possibly HHPT, per progress)    Equipment Recommendations  Rolling walker with 5" wheels    Recommendations for Other Services       Precautions / Restrictions Restrictions Weight Bearing Restrictions: No      Mobility  Bed Mobility Overal bed mobility: Independent             General bed mobility comments: Pt able to get to/from supine w/o direct assist  Transfers Overall transfer level: Modified independent               General transfer comment: Pt was able to rise to standing w/o assist, feels more confident with AD  Ambulation/Gait Ambulation/Gait assistance: Supervision Gait Distance (Feet): 200 Feet Assistive device: Rolling walker (2 wheeled);1 person hand held assist;None       General Gait Details: Pt is able to ambulate relatively confidently with AD.  She did have some unsteadiness without AD and did not feel as confident but did manange  ~40 ft.  Pt with some tightness in LEs with "prolonged" ambulation and we did not push more than 200 ft.  She did not have any LOBs or overt safety issues.    Stairs            Wheelchair Mobility    Modified Rankin (Stroke Patients Only)       Balance Overall balance assessment: Modified Independent                                           Pertinent Vitals/Pain Pain Assessment: (chronic LBP, no acute pain issues)    Home Living Family/patient expects to be discharged to:: Private residence Living Arrangements: Alone;Other (Comment)(does not have vehicle) Available Help at Discharge: (friend and some family is able to assist minimally) Type of Home: House Home Access: Stairs to enter Entrance Stairs-Rails: Left;Right(wide) Technical brewer of Steps: 5-6          Prior Function Level of Independence: Independent         Comments: Pt reports that she does not need AD typically but just started using SPC secondary to some unsteadiness     Hand Dominance        Extremity/Trunk Assessment   Upper Extremity Assessment Upper Extremity Assessment: Overall WFL for tasks assessed    Lower Extremity Assessment Lower Extremity Assessment: Overall WFL for tasks assessed  Communication   Communication: No difficulties  Cognition Arousal/Alertness: Awake/alert Behavior During Therapy: WFL for tasks assessed/performed Overall Cognitive Status: Within Functional Limits for tasks assessed                                        General Comments General comments (skin integrity, edema, etc.): Pt with some subjective description of BPPV-like issues, however (-) Dix-Hallpike bilaterally with zero nystagmus or vertigo.      Exercises     Assessment/Plan    PT Assessment Patient needs continued PT services  PT Problem List         PT Treatment Interventions Gait training;Therapeutic exercise;Functional mobility  training;Stair training;DME instruction;Balance training;Neuromuscular re-education;Therapeutic activities    PT Goals (Current goals can be found in the Care Plan section)  Acute Rehab PT Goals Patient Stated Goal: go home PT Goal Formulation: With patient Time For Goal Achievement: 03/28/18 Potential to Achieve Goals: Good    Frequency Min 2X/week   Barriers to discharge        Co-evaluation               AM-PAC PT "6 Clicks" Daily Activity  Outcome Measure Difficulty turning over in bed (including adjusting bedclothes, sheets and blankets)?: None Difficulty moving from lying on back to sitting on the side of the bed? : None Difficulty sitting down on and standing up from a chair with arms (e.g., wheelchair, bedside commode, etc,.)?: None Help needed moving to and from a bed to chair (including a wheelchair)?: None Help needed walking in hospital room?: A Little Help needed climbing 3-5 steps with a railing? : A Little 6 Click Score: 22    End of Session Equipment Utilized During Treatment: Gait belt Activity Tolerance: Patient tolerated treatment well;Patient limited by fatigue Patient left: in bed Nurse Communication: Mobility status      Time: 3299-2426 PT Time Calculation (min) (ACUTE ONLY): 28 min   Charges:   PT Evaluation $PT Eval Low Complexity: 1 Low          Kreg Shropshire, DPT 03/14/2018, 2:09 PM

## 2018-03-14 NOTE — Plan of Care (Signed)
Pleasant and cooperative.willing to discuss medication regime with MD in AM.

## 2018-03-14 NOTE — BHH Counselor (Signed)
CSW spoke with the patient about her options with residential/inpatient substance abuse treatment such as [facility]. Patient reports that she wants something longer-term. CSW informed patient that her options are limited due to medical concerns and the unavailability of beds in long-term residential programs. Patient seems hesitant about inpatient treatment. CSW spoke with her about intensive outpatient treatment such as [facility]. Patient told CSW that she would like to sleep on it and provide a decision tomorrow.  Darin Engels, MSW, Latanya Presser, [place] Clinical Social Worker 03/14/2018 2:27 PM

## 2018-03-14 NOTE — H&P (Signed)
Psychiatric Admission Assessment Adult  Patient Identification: Melinda [name] MRN:  [id number] Date of Evaluation:  03/14/2018 Chief Complaint:  Major depressive Principal Diagnosis: <principal problem not specified> Diagnosis:   Patient Active Problem List   Diagnosis Date Noted  . Severe recurrent major depression without psychotic features ([place]) [F33.2] 03/12/2018  . Cocaine abuse ([place]) [F14.10] 03/12/2018  . Suicidal ideation [R45.851] 03/12/2018  . Atherosclerosis of native arteries of the extremities with ulceration ([place]) [I70.25] 06/19/2017  . Gangrene of toe of left foot ([place]) [I96] 05/14/2017  . CAD (coronary artery disease), native coronary artery [I25.10] 05/05/2017  . Claudication in peripheral vascular disease ([place]) [I73.9] 10/18/2016  . Hyperlipidemia LDL goal <70 [E78.5] 10/18/2016  . Onychomycosis [B35.1] 10/10/2016  . Medication monitoring encounter [Z51.81] 10/10/2016  . Amputated toe, left ([place]) [X54.008Q] 10/10/2016  . Amputated toe, right ([place]) [P61.950D] 10/10/2016  . Osteomyelitis of left foot ([place]) [M86.9] 10/21/2015  . Hyperglycemia [R73.9] 10/02/2015  . IDDM (insulin dependent diabetes mellitus) ([place]) [E11.9, Z79.4]   . Peripheral neuropathy [G62.9]   . Tobacco abuse [Z72.0]   . Obesity [E66.9]   . Ischemic cardiomyopathy [I25.5]   . 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 HTN (hypertension) [I10]   . DM2 (diabetes mellitus, type 2) ([place]) [E11.9]   . GERD (gastroesophageal reflux disease) [K21.9]   . NSTEMI (non-ST elevated myocardial infarction) ([place]) [I21.4] 06/03/2014   History of Present Illness: Patient is a 62 year old woman with a long history of multiple medical problems including diabetes, hyper tension, peripheral neuropathy, ischemic cardiomyopathy and the use of drugs and alcohol to the emergency room complaining of a fall but then reported she was very depressed and that she was abusing cocaine.  Patient reports that her mood has been quite  depressed for a few months now.  She has been ruminating about all the bad things that happened in her life.  She reports having more negative thoughts.  She has not been sleeping well and has been eating a lot.  Reports that she has been using crack cocaine and using all her money on it.  She has had periods of clean times but had relapsed recently. She did see her primary care doctor for depression and was prescribed various medications. She is unable to recall the names. States they did not work for her, one of them made her feel suicidal. We discussed starting prozac but patient is not willing to start right away, she will think about it.     She also reports having frequent intrusive thoughts about death and dying.  She denies any suicidal thoughts actively.  She denies having any visual or auditory hallucinations.  Total Time spent with patient: 1 hour  Past Psychiatric History: Patient says she had one inpatient hospitalization years ago.  I was not able to locate a record of that.  She says she was in the hospital for only 5 days.  Has been on antidepressants in the past but cannot remember what they were.  Patient has had some serious thoughts about trying to kill herself in the past.  Has come close to suicide attempts in the past.  No history of violence no history of psychosis  Substance abuse history:Patient says that she has used crack cocaine for years.  She managed to stop and was staying off it for some undefined amount of time but then a couple months ago she came under some bad influence and started using it again.  Sounds like she is pretty  much using up her whole check.  She is not drinking and denies any other drug use.  Is the patient at risk to self? Yes.    Has the patient been a risk to self in the past 6 months? Yes.    Has the patient been a risk to self within the distant past? Yes.    Is the patient a risk to others? No.  Has the patient been a risk to others in the past  6 months? No.  Has the patient been a risk to others within the distant past? No.   Prior Inpatient Therapy: Prior Inpatient Therapy: Yes Prior Therapy Dates: Unable to recall dates Prior Therapy Facilty/Provider(s): Unable to recall Reason for Treatment: Depression Prior Outpatient Therapy: Prior Outpatient Therapy: No Does patient have an ACCT team?: No Does patient have Intensive In-House Services?  : No Does patient have [facility] services? : No Does patient have P4CC services?: No  Alcohol Screening: Patient refused Alcohol Screening Tool: Yes 1. How often do you have a drink containing alcohol?: Never 2. How many drinks containing alcohol do you have on a typical day when you are drinking?: 1 or 2 3. How often do you have six or more drinks on one occasion?: Never AUDIT-C Score: 0 4. How often during the last year have you found that you were not able to stop drinking once you had started?: Never 5. How often during the last year have you failed to do what was normally expected from you becasue of drinking?: Never 6. How often during the last year have you needed a first drink in the morning to get yourself going after a heavy drinking session?: Never 7. How often during the last year have you had a feeling of guilt of remorse after drinking?: Never 8. How often during the last year have you been unable to remember what happened the night before because you had been drinking?: Never 9. Have you or someone else been injured as a result of your drinking?: No 10. Has a relative or friend or a doctor or another health worker been concerned about your drinking or suggested you cut down?: No Alcohol Use Disorder Identification Test Final Score (AUDIT): 0 Intervention/Follow-up: AUDIT Score <7 follow-up not indicated Substance Abuse History in the last 12 months:  Yes.   Consequences of Substance Abuse: Family Consequences:  estranged Previous Psychotropic Medications: Yes  Psychological  Evaluations: No  Past Medical History:  Past Medical History:  Diagnosis Date  . Anxiety   . Coronary artery disease    a. NSTEMI 06/2014; b. 4 vessel CABG 06/08/2014 LIMA to LAD, SVG to Diagonal, SVG to OM, & SVG to PDA  . Diabetic neuropathy ([place])   . GERD (gastroesophageal reflux disease)   . Heart attack ([place])   . HTN (hypertension)   . IDDM (insulin dependent diabetes mellitus) ([place])   . Ischemic cardiomyopathy    a. echo 06/04/14 EF 40-45%, HK of entire inferolateral and inferior myocardium c.w infarct of RCA/LCx, GR2DD, mild MR  . OA (osteoarthritis) of knee   . Obesity   . Osteoarthritis   . Osteomyelitis ([place])   . Peripheral neuropathy   . Peripheral vascular disease ([place])   . Tobacco abuse     Past Surgical History:  Procedure Laterality Date  . ABDOMINAL AORTOGRAM W/LOWER EXTREMITY N/A 10/27/2016   Procedure: Abdominal Aortogram w/Lower Extremity;  Surgeon: Nelva Bush, MD;  Location: [facility];  Service: Cardiovascular;  Laterality: N/A;  .  AMPUTATION TOE Left 10/21/2015   Procedure: AMPUTATION TOE;  Surgeon: Sharlotte Alamo, DPM;  Location: [facility];  Service: Podiatry;  Laterality: Left;  . AMPUTATION TOE Left 05/15/2017   Procedure: AMPUTATION LEFT GREAT TOE;  Surgeon: Sharlotte Alamo, DPM;  Location: [facility];  Service: Podiatry;  Laterality: Left;  . AORTIC VALVE REPLACEMENT (AVR)/CORONARY ARTERY BYPASS GRAFTING (CABG)    . BREAST BIOPSY    . CARDIAC CATHETERIZATION  06/2014   95% stenosis mLAD, occlusion ostial OM1, 70% stenosis LCx, 95% stenosis mRCA, EF 45%.  . CORONARY ARTERY BYPASS GRAFT N/A 06/08/2014   Procedure: CORONARY ARTERY BYPASS GRAFTING (CABG);  Surgeon: Grace Isaac, MD;  Location: [facility];  Service: Open Heart Surgery;  Laterality: N/A;  Times 4 using left internal mammary artery to LAD artery and endoscopically harvested bilateral saphenous vein to Obtuse Marginal, Diagonal and Posterior Descending coronary arteries.  . CT ABD W & PELVIS WO CM   06/2014   nl liver, gallbladder, spleen, mild diverticular changes, no bowel wall inflammation, appendix nl, no hernia, no other sig abnormalities  . LOWER EXTREMITY ANGIOGRAPHY Left 05/23/2017   Procedure: LOWER EXTREMITY ANGIOGRAPHY;  Surgeon: Algernon Huxley, MD;  Location: [facility];  Service: Cardiovascular;  Laterality: Left;  . LOWER EXTREMITY ANGIOGRAPHY Left 05/28/2017   Procedure: LOWER EXTREMITY ANGIOGRAPHY;  Surgeon: Algernon Huxley, MD;  Location: [facility];  Service: Cardiovascular;  Laterality: Left;  . TEE WITHOUT CARDIOVERSION N/A 06/08/2014   Procedure: TRANSESOPHAGEAL ECHOCARDIOGRAM (TEE);  Surgeon: Grace Isaac, MD;  Location: [facility];  Service: Open Heart Surgery;  Laterality: N/A;  . TOE AMPUTATION Right 12/2011   rt middle toe  . US ECHOCARDIOGRAPHY  06/2014   EF 50-55%, HK of inf/post/inferolat walls, Ao sclerosis   Family History:  Family History  Problem Relation Age of Onset  . CAD Mother   . Hypertension Mother   . Diabetes Mother   . CAD Father   . Diabetes Father   . Sickle cell trait Other   . Asthma Other    Family Psychiatric  History: unknown Tobacco Screening: Have you used any form of tobacco in the last 30 days? (Cigarettes, Smokeless Tobacco, Cigars, and/or Pipes): No Social History:  Social History   Substance and Sexual Activity  Alcohol Use No  . Alcohol/week: 0.0 standard drinks     Social History   Substance and Sexual Activity  Drug Use Yes   Comment: crack    Additional Social History: Marital status: Single    Pain Medications: See MAR Prescriptions: See MAR Over the Counter: See MAR History of alcohol / drug use?: Yes Longest period of sobriety (when/how long): Unable to quantify Negative Consequences of Use: Financial, Personal relationships Name of Substance 1: Cocaine 1 - Age of First Use: Unable to quantify 1 - Amount (size/oz): Unable to quantify 1 - Frequency: Unable to quantify 1 - Duration: Unable  to quantify 1 - Last Use / Amount: "a few days ago"                  Allergies:  No Known Allergies Lab Results:  Results for orders placed or performed during the hospital encounter of 03/13/18 (from the past 48 hour(s))  Glucose, capillary     Status: Abnormal   Collection Time: 03/13/18  5:00 PM  Result Value Ref Range   Glucose-Capillary 174 (H) 70 - 99 mg/dL  Glucose, capillary     Status: Abnormal   Collection  Time: 03/13/18  8:35 PM  Result Value Ref Range   Glucose-Capillary 111 (H) 70 - 99 mg/dL   Comment 1 Notify RN   Basic metabolic panel     Status: Abnormal   Collection Time: 03/14/18  7:07 AM  Result Value Ref Range   Sodium 143 135 - 145 mmol/L   Potassium 4.5 3.5 - 5.1 mmol/L   Chloride 111 98 - 111 mmol/L   CO2 26 22 - 32 mmol/L   Glucose, Bld 122 (H) 70 - 99 mg/dL   BUN 32 (H) 8 - 23 mg/dL   Creatinine, Ser 1.38 (H) 0.44 - 1.00 mg/dL   Calcium 8.4 (L) 8.9 - 10.3 mg/dL   GFR calc non Af Amer 39 (L) >60 mL/min   GFR calc Af Amer 46 (L) >60 mL/min    Comment: (NOTE) The eGFR has been calculated using the CKD EPI equation. This calculation has not been validated in all clinical situations. eGFR's persistently <60 mL/min signify possible Chronic Kidney Disease.    Anion gap 6 5 - 15    Comment: Performed at [facility], [address] [place], [place] [address]  Hemoglobin A1c     Status: Abnormal   Collection Time: 03/14/18  7:07 AM  Result Value Ref Range   Hgb A1c MFr Bld 7.9 (H) 4.8 - 5.6 %    Comment: (NOTE) Pre diabetes:          5.7%-6.4% Diabetes:              >6.4% Glycemic control for   <7.0% adults with diabetes    Mean Plasma Glucose 180.03 mg/dL    Comment: Performed at [facility] [address] [place], [place] [address]  Lipid panel     Status: Abnormal   Collection Time: 03/14/18  7:07 AM  Result Value Ref Range   Cholesterol 92 0 - 200 mg/dL   Triglycerides 71 <150 mg/dL   HDL 39 (L) >40 mg/dL   Total  CHOL/HDL Ratio 2.4 RATIO   VLDL 14 0 - 40 mg/dL   LDL Cholesterol 39 0 - 99 mg/dL    Comment:        Total Cholesterol/HDL:CHD Risk Coronary Heart Disease Risk Table                     Men   Women  1/2 Average Risk   3.4   3.3  Average Risk       5.0   4.4  2 X Average Risk   9.6   7.1  3 X Average Risk  23.4   11.0        Use the calculated Patient Ratio above and the CHD Risk Table to determine the patient's CHD Risk.        ATP III CLASSIFICATION (LDL):  <100     mg/dL   Optimal  100-129  mg/dL   Near or Above                    Optimal  130-159  mg/dL   Borderline  160-189  mg/dL   High  >190     mg/dL   Very High Performed at [facility], [address] [place], [place] [address]   TSH     Status: None   Collection Time: 03/14/18  7:07 AM  Result Value Ref Range   TSH 0.545 0.350 - 4.500 uIU/mL    Comment: Performed  by a 3rd Generation assay with a functional sensitivity of <=0.01 uIU/mL. Performed at [facility], [address] [place], [place] [address]   Glucose, capillary     Status: Abnormal   Collection Time: 03/14/18  7:08 AM  Result Value Ref Range   Glucose-Capillary 115 (H) 70 - 99 mg/dL   Comment 1 Notify RN     Blood Alcohol level:  No results found for: [facility]  Metabolic Disorder Labs:  Lab Results  Component Value Date   HGBA1C 7.9 (H) 03/14/2018   MPG 180.03 03/14/2018   MPG 289.09 05/14/2017   No results found for: PROLACTIN Lab Results  Component Value Date   CHOL 92 03/14/2018   TRIG 71 03/14/2018   HDL 39 (L) 03/14/2018   CHOLHDL 2.4 03/14/2018   VLDL 14 03/14/2018   LDLCALC 39 03/14/2018   LDLCALC 103 (H) 10/10/2016    Current Medications: Current Facility-Administered Medications  Medication Dose Route Frequency Provider Last Rate Last Dose  . acetaminophen (TYLENOL) tablet 650 mg  650 mg Oral Q6H PRN Clapacs, John T, MD      . alum & mag hydroxide-simeth (MAALOX/MYLANTA) 200-200-20 MG/5ML suspension 30  mL  30 mL Oral Q4H PRN Clapacs, John T, MD      . aspirin EC tablet 81 mg  81 mg Oral Daily Clapacs, Madie Reno, MD   81 mg at 03/14/18 6073  . atorvastatin (LIPITOR) tablet 80 mg  80 mg Oral q1800 Clapacs, Madie Reno, MD   80 mg at 03/13/18 1751  . clopidogrel (PLAVIX) tablet 75 mg  75 mg Oral Daily Clapacs, Madie Reno, MD   75 mg at 03/14/18 0808  . furosemide (LASIX) tablet 20 mg  20 mg Oral Daily Clapacs, Madie Reno, MD   20 mg at 03/14/18 7106  . insulin aspart (novoLOG) injection 3 Units  3 Units Subcutaneous TID WC Clapacs, Madie Reno, MD   3 Units at 03/14/18 [phone]  . insulin glargine (LANTUS) injection 60 Units  60 Units Subcutaneous BID Clapacs, Madie Reno, MD   60 Units at 03/14/18 [phone]  . lisinopril (PRINIVIL,ZESTRIL) tablet 40 mg  40 mg Oral Daily Clapacs, Madie Reno, MD   40 mg at 03/13/18 1804  . magnesium hydroxide (MILK OF MAGNESIA) suspension 30 mL  30 mL Oral Daily PRN Clapacs, John T, MD      . metoprolol tartrate (LOPRESSOR) tablet 25 mg  25 mg Oral BID Clapacs, Madie Reno, MD   25 mg at 03/13/18 1804  . traZODone (DESYREL) tablet 100 mg  100 mg Oral QHS PRN Clapacs, Madie Reno, MD   100 mg at 03/13/18 2202   PTA Medications: Medications Prior to Admission  Medication Sig Dispense Refill Last Dose  . aspirin 81 MG EC tablet Take 1 tablet (81 mg total) by mouth daily. 30 tablet 0 03/12/2018 at 0800  . atorvastatin (LIPITOR) 80 MG tablet Take 1 tablet (80 mg total) by mouth daily at 6 PM. 90 tablet 3 03/11/2018 at 2000  . clopidogrel (PLAVIX) 75 MG tablet Take 1 tablet (75 mg total) by mouth daily. 30 tablet 0 03/12/2018 at 0800  . escitalopram (LEXAPRO) 10 MG tablet Take 10 mg by mouth daily.   03/12/2018 at 0800  . furosemide (LASIX) 20 MG tablet Take 1 tablet (20 mg total) by mouth daily as needed. (Patient taking differently: Take 20 mg by mouth daily as needed for fluid. ) 90 tablet 3 Unknown at PRN  . gabapentin (NEURONTIN) 400 MG  capsule Take 1,200 mg by mouth 3 (three) times daily.   03/12/2018 at 0800  .  insulin aspart (NOVOLOG) 100 UNIT/ML injection Inject 3 Units into the skin 3 (three) times daily with meals. 10 mL 0 Taking  . insulin glargine (LANTUS) 100 UNIT/ML injection Inject 0.4 mLs (40 Units total) into the skin 2 (two) times daily. (Patient taking differently: Inject 60 Units into the skin 2 (two) times daily. ) 4 vial 1 03/11/2018 at Unknown time  . lisinopril (PRINIVIL,ZESTRIL) 40 MG tablet Take 1 tablet (40 mg total) by mouth daily. 90 tablet 3 03/12/2018 at 0800  . metoprolol tartrate (LOPRESSOR) 25 MG tablet Take 1 tablet (25 mg total) by mouth 2 (two) times daily. 180 tablet 3 03/12/2018 at 0800  . naproxen (NAPROSYN) 500 MG tablet Take 500 mg by mouth 2 (two) times daily.   03/12/2018 at 0800  . oxybutynin (DITROPAN) 5 MG tablet Take 5 mg by mouth 2 (two) times daily.    03/12/2018 at 0800  . potassium chloride SA (K-DUR,KLOR-CON) 20 MEQ tablet Take 1 tablet (20 mEq total) by mouth daily as needed. (Patient taking differently: Take 20 mEq by mouth daily as needed (with Lasix). ) 90 tablet 3 Unknown at PRN  . varenicline (CHANTIX CONTINUING MONTH PAK) 1 MG tablet Take 1 tablet (1 mg total) by mouth 2 (two) times daily. 60 tablet 1 03/12/2018 at 0800  . varenicline (CHANTIX STARTING MONTH PAK) 0.5 MG X 11 & 1 MG X 42 tablet Take one 0.5 mg tablet by mouth once daily for 3 days,then take one 0.5 mg tablet twice daily for 4 days,then take 1 mg tablet twice daily. 53 tablet 0     Musculoskeletal: Strength & Muscle Tone: within normal limits Gait & Station: normal Patient leans: N/A  Psychiatric Specialty Exam: Physical Exam  ROS  Blood pressure (!) 109/51, pulse 72, temperature 98.8 F (37.1 C), temperature source Oral, resp. rate 18, height 5' 8" (1.727 m), weight 115.7 kg, SpO2 100 %.Body mass index is 38.77 kg/m.  General Appearance: Disheveled  Eye Contact:  Minimal  Speech:  Slow  Volume:  Decreased  Mood:  Depressed, Dysphoric and Hopeless  Affect:  Constricted and  Depressed  Thought Process:  Coherent  Orientation:  Full (Time, Place, and Person)  Thought Content:  Logical and Rumination  Suicidal Thoughts:  Yes.  with intent/plan  Homicidal Thoughts:  No  Memory:  Immediate;   Fair Recent;   Fair Remote;   Fair  Judgement:  Impaired  Insight:  Shallow  Psychomotor Activity:  Decreased  Concentration:  Concentration: Fair and Attention Span: Fair  Recall:  [employer] of Knowledge:  Fair  Language:  Fair  Akathisia:  No  Handed:  Right  AIMS (if indicated):     Assets:  Communication Skills  ADL's:  Impaired  Cognition:  WNL  Sleep:  Number of Hours: 5.75    Treatment Plan Summary: Daily contact with patient to assess and evaluate symptoms and progress in treatment and Medication management   Major Depressive Disorder -Start antidepressant such as SSRI -monitor for mood and safety -attend groups and develop coping skills  Suicidal ideations -develop action action alternatives for suicidal thoughts -develop coping skills to deal with negative thoughts.  Cocaine abuse -patient to consider substance abuse programs upon discharge  Diabetes -continue insulin lantus 60 units -insulin novolog 3 units tid with meals  Hypertension -continue lisinopril 22m daily  Wound on Left foot -consult called -  [facility] nurse following   Dizziness - PT consult called , patient recommended to use walker and not be in the wheelchair all day  Diabetic Neuropathy -Start Neurontin 149m po tid  Hyperlipidemia -continue lipitor at 818mpo qd  Coronary artery disease -continue plavix at 7569mo qd -lasix 52m51m qd   Labs -lipid panel - hdl low, elevated LDL -hgA1c 7.9 -Blood glucose this am 115 -TSH normal -CMP shows decreased calcium, elevated BUN, creatinine  Discharge -safety and stability     Observation Level/Precautions:  15 minute checks  Laboratory:  See above  Psychotherapy: See above  Medications:  See above   Consultations:  As needed  Discharge Concerns:  Safety   Estimated LOS:6-7 days  Other:     Physician Treatment Plan for Primary Diagnosis: <principal problem not specified> Long Term Goal(s): Improvement in symptoms so as ready for discharge  Short Term Goals: Ability to identify changes in lifestyle to reduce recurrence of condition will improve, Ability to verbalize feelings will improve, Ability to disclose and discuss suicidal ideas, Ability to demonstrate self-control will improve, Ability to identify and develop effective coping behaviors will improve, Ability to maintain clinical measurements within normal limits will improve, Compliance with prescribed medications will improve and Ability to identify triggers associated with substance abuse/mental health issues will improve  Physician Treatment Plan for Secondary Diagnosis: Active Problems:   Severe recurrent major depression without psychotic features (HCC)[place] Term Goal(s): Improvement in symptoms so as ready for discharge  Short Term Goals: Ability to identify changes in lifestyle to reduce recurrence of condition will improve, Ability to verbalize feelings will improve, Ability to disclose and discuss suicidal ideas, Ability to demonstrate self-control will improve, Ability to identify and develop effective coping behaviors will improve, Ability to maintain clinical measurements within normal limits will improve, Compliance with prescribed medications will improve and Ability to identify triggers associated with substance abuse/mental health issues will improve  I certify that inpatient services furnished can reasonably be expected to improve the patient's condition.    HimaElvin So 11/14/20199:38 AM

## 2018-03-14 NOTE — Progress Notes (Signed)
Physical Therapist spoke with this Probation officer and explained that patient is able to get up and walk some with the walker, but she does experience some cramping and weakness when up walking for a while. Patient still needs the wheelchair present because the physical therapist would not advice her to use the walker all day.
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
## 2018-03-14 NOTE — BHH Group Notes (Signed)
LCSW Group Therapy Note  03/14/2018 1:00 pm  Type of Therapy/Topic:  Group Therapy:  Balance in Life  Participation Level:  Did Not Attend  Description of Group:    This group will address the concept of balance and how it feels and looks when one is unbalanced. Patients will be encouraged to process areas in their lives that are out of balance and identify reasons for remaining unbalanced. Facilitators will guide patients in utilizing problem-solving interventions to address and correct the stressor making their life unbalanced. Understanding and applying boundaries will be explored and addressed for obtaining and maintaining a balanced life. Patients will be encouraged to explore ways to assertively make their unbalanced needs known to significant others in their lives, using other group members and facilitator for support and feedback.  Therapeutic Goals: 1. Patient will identify two or more emotions or situations they have that consume much of in their lives. 2. Patient will identify signs/triggers that life has become out of balance:  3. Patient will identify two ways to set boundaries in order to achieve balance in their lives:  4. Patient will demonstrate ability to communicate their needs through discussion and/or role plays  Summary of Patient Progress:      Therapeutic Modalities:   Cognitive Behavioral Therapy Solution-Focused Therapy Assertiveness Training  Devona Konig, [place] 03/14/2018 3:04 PM

## 2018-03-14 NOTE — Progress Notes (Signed)
Recreation Therapy Notes  Date: 03/14/2018  Time: 9:30 am  Location: Craft Room  Behavioral response: Appropriate   Intervention Topic: Happiness  Discussion/Intervention:  Group content today was focused on Happiness. The group defined happiness and stated reasons they are and are not happy at times. Participants identified reasons they are normally happy and why. Individuals expressed how not being happy affects themselves and others. Patients stated reasons why happiness is important to them. The group described how they feel when they are happy. Individuals participated in the intervention "What is happiness" where they defined what happiness means to them.   Clinical Observations/Feedback:  Patient came to group late due to unknown reasons. Individual was social with peers and staff while participating in the intervention.  Kyndahl Jablon LRT/CTRS             Martin Smeal 03/14/2018 1:45 PM

## 2018-03-14 NOTE — Progress Notes (Signed)
D- Patient alert and oriented. Patient presents in a pleasant mood on assessment stating that she had some issues sleeping because of her diabetic neuropathy in her hands/feet and legs, rating her pain a "7/10" this morning. Patient did not request any pain medication from this writer, however, she is wanting her gabapentin. Patient denies SI, HI, AVH, at this time. Patient's goal for today is to "try to walk".  A- Scheduled medications administered to patient, per MD orders. Support and encouragement provided.  Routine safety checks conducted every 15 minutes.  Patient informed to notify staff with problems or concerns.  R- No adverse drug reactions noted. Patient contracts for safety at this time. Patient compliant with medications and treatment plan. Patient receptive, calm, and cooperative. Patient interacts well with others on the unit.  Patient remains safe at this time.

## 2018-03-14 NOTE — Progress Notes (Signed)
Patient stayed in room but was active and cooperative. Talked to staff and was able to express needs and feelings. Pleasant on approach. Admitted that she is feeling depressed but denying thoughts of self harm. Expressed dissatisfaction related to medication regime "I am supposed to be taking my nerve pill..my medications are not right..." Was encouraged to discuss medication regime with MD upon evaluation in the morning. Patient requested sleeping medication and received Trazodone. Did not have any additional concerns. Patient received a snack and went to bed. Slept throughout the night and appeared to be comfortable in bed. Staff continue to monitor for safety.
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
## 2018-03-15 LAB — GLUCOSE, CAPILLARY
Glucose-Capillary: 140 mg/dL — ABNORMAL HIGH (ref 70–99)
Glucose-Capillary: 151 mg/dL — ABNORMAL HIGH (ref 70–99)
Glucose-Capillary: 200 mg/dL — ABNORMAL HIGH (ref 70–99)
Glucose-Capillary: 86 mg/dL (ref 70–99)

## 2018-03-15 MED ORDER — FLUOXETINE HCL 10 MG PO CAPS
10.0000 mg | ORAL_CAPSULE | Freq: Every day | ORAL | Status: DC
  Administered 2018-03-15 – 2018-03-21 (×7): 10 mg via ORAL
  Filled 2018-03-15 (×8): qty 1

## 2018-03-15 MED ORDER — GABAPENTIN 400 MG PO CAPS
400.0000 mg | ORAL_CAPSULE | Freq: Three times a day (TID) | ORAL | Status: DC
  Administered 2018-03-15 – 2018-03-21 (×19): 400 mg via ORAL
  Filled 2018-03-15 (×19): qty 1

## 2018-03-15 NOTE — BHH Counselor (Signed)
Treatment team for the patient has been postponed until Monday 03/18/2018 due to the MD not being available. Treatment team will meet with the patient on Monday 03/18/2018.  Darin Engels, MSW, Latanya Presser, [place] Clinical Social Worker 03/15/2018 11:28 AM

## 2018-03-15 NOTE — Progress Notes (Signed)
Received Melinda [name] this AM, she was OOB in the milieu with her walker at intervals and attending the group therapy sessions. She is socializing with select peers. She took her first dose of  Prozac with resistance. She was given information on the medication.

## 2018-03-15 NOTE — Progress Notes (Signed)
[facility] MD Progress Note  03/15/2018 9:14 AM Melinda [name]  MRN:  [id number] Subjective: Patient is a 62 year old woman with a long history of multiple medical problems including diabetes, hyper tension, peripheral neuropathy, ischemic cardiomyopathy and the use of drugs and alcohol to the emergency room complaining of a fall but then reported she was very depressed and that she was abusing cocaine.   Patient was seen this morning and chart reviewed.  She complains of bad neuropathy.  States that she was taking gabapentin at 400 mg 3 times daily.  We discussed that since she had been abusing cocaine in the last few weeks was unsure if she had been taking this dosage.  Patient reports she had not had been taking it and it does help with her pain.  She has been cooperative on the unit and participating in recreation therapy and some groups. Patient states that she is okay to start with the Prozac for her depression.  Per social worker patient is at risk of losing her home. She denies any suicidal thoughts today. Principal Problem: <principal problem not specified> Diagnosis:   Patient Active Problem List   Diagnosis Date Noted  . Severe recurrent major depression without psychotic features ([place]) [F33.2] 03/12/2018  . Cocaine abuse ([place]) [F14.10] 03/12/2018  . Suicidal ideation [R45.851] 03/12/2018  . Atherosclerosis of native arteries of the extremities with ulceration ([place]) [I70.25] 06/19/2017  . Gangrene of toe of left foot ([place]) [I96] 05/14/2017  . CAD (coronary artery disease), native coronary artery [I25.10] 05/05/2017  . Claudication in peripheral vascular disease ([place]) [I73.9] 10/18/2016  . Hyperlipidemia LDL goal <70 [E78.5] 10/18/2016  . Onychomycosis [B35.1] 10/10/2016  . Medication monitoring encounter [Z51.81] 10/10/2016  . Amputated toe, left ([place]) [W09.811B] 10/10/2016  . Amputated toe, right ([place]) [J47.829F] 10/10/2016  . Osteomyelitis of left foot ([place]) [M86.9] 10/21/2015  .  Hyperglycemia [R73.9] 10/02/2015  . IDDM (insulin dependent diabetes mellitus) ([place]) [E11.9, Z79.4]   . Peripheral neuropathy [G62.9]   . Tobacco abuse [Z72.0]   . Obesity [E66.9]   . Ischemic cardiomyopathy [I25.5]   . 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 HTN (hypertension) [I10]   . DM2 (diabetes mellitus, type 2) ([place]) [E11.9]   . GERD (gastroesophageal reflux disease) [K21.9]   . NSTEMI (non-ST elevated myocardial infarction) ([place]) [I21.4] 06/03/2014   Total Time spent with patient: 20 minutes  Past Psychiatric History: Patient has been hospitalized once for psychiatric reasons and has been on multiple medications which she cannot remember.  Most recently she had been taking Lexapro.  Past Medical History:  Past Medical History:  Diagnosis Date  . Anxiety   . Coronary artery disease    a. NSTEMI 06/2014; b. 4 vessel CABG 06/08/2014 LIMA to LAD, SVG to Diagonal, SVG to OM, & SVG to PDA  . Diabetic neuropathy ([place])   . GERD (gastroesophageal reflux disease)   . Heart attack ([place])   . HTN (hypertension)   . IDDM (insulin dependent diabetes mellitus) ([place])   . Ischemic cardiomyopathy    a. echo 06/04/14 EF 40-45%, HK of entire inferolateral and inferior myocardium c.w infarct of RCA/LCx, GR2DD, mild MR  . OA (osteoarthritis) of knee   . Obesity   . Osteoarthritis   . Osteomyelitis ([place])   . Peripheral neuropathy   . Peripheral vascular disease ([place])   . Tobacco abuse     Past Surgical History:  Procedure Laterality Date  . ABDOMINAL AORTOGRAM W/LOWER EXTREMITY N/A 10/27/2016   Procedure: Abdominal  Aortogram w/Lower Extremity;  Surgeon: Nelva Bush, MD;  Location: [place] [facility];  Service: Cardiovascular;  Laterality: N/A;  . AMPUTATION TOE Left 10/21/2015   Procedure: AMPUTATION TOE;  Surgeon: Sharlotte Alamo, DPM;  Location: [facility];  Service: Podiatry;  Laterality: Left;  . AMPUTATION TOE Left 05/15/2017   Procedure: AMPUTATION LEFT GREAT TOE;  Surgeon: Sharlotte Alamo, DPM;   Location: [facility];  Service: Podiatry;  Laterality: Left;  . AORTIC VALVE REPLACEMENT (AVR)/CORONARY ARTERY BYPASS GRAFTING (CABG)    . BREAST BIOPSY    . CARDIAC CATHETERIZATION  06/2014   95% stenosis mLAD, occlusion ostial OM1, 70% stenosis LCx, 95% stenosis mRCA, EF 45%.  . CORONARY ARTERY BYPASS GRAFT N/A 06/08/2014   Procedure: CORONARY ARTERY BYPASS GRAFTING (CABG);  Surgeon: Grace Isaac, MD;  Location: [place];  Service: Open Heart Surgery;  Laterality: N/A;  Times 4 using left internal mammary artery to LAD artery and endoscopically harvested bilateral saphenous vein to Obtuse Marginal, Diagonal and Posterior Descending coronary arteries.  . CT ABD W & PELVIS WO CM  06/2014   nl liver, gallbladder, spleen, mild diverticular changes, no bowel wall inflammation, appendix nl, no hernia, no other sig abnormalities  . LOWER EXTREMITY ANGIOGRAPHY Left 05/23/2017   Procedure: LOWER EXTREMITY ANGIOGRAPHY;  Surgeon: Algernon Huxley, MD;  Location: [place] [facility];  Service: Cardiovascular;  Laterality: Left;  . LOWER EXTREMITY ANGIOGRAPHY Left 05/28/2017   Procedure: LOWER EXTREMITY ANGIOGRAPHY;  Surgeon: Algernon Huxley, MD;  Location: [place] [facility];  Service: Cardiovascular;  Laterality: Left;  . TEE WITHOUT CARDIOVERSION N/A 06/08/2014   Procedure: TRANSESOPHAGEAL ECHOCARDIOGRAM (TEE);  Surgeon: Grace Isaac, MD;  Location: [place];  Service: Open Heart Surgery;  Laterality: N/A;  . TOE AMPUTATION Right 12/2011   rt middle toe  . US ECHOCARDIOGRAPHY  06/2014   EF 50-55%, HK of inf/post/inferolat walls, Ao sclerosis   Family History:  Family History  Problem Relation Age of Onset  . CAD Mother   . Hypertension Mother   . Diabetes Mother   . CAD Father   . Diabetes Father   . Sickle cell trait Other   . Asthma Other    Family Psychiatric  History: unknown Social History:  Social History   Substance and Sexual Activity  Alcohol Use No  . Alcohol/week: 0.0 standard drinks      Social History   Substance and Sexual Activity  Drug Use Yes   Comment: crack    Social History   Socioeconomic History  . Marital status: Widowed    Spouse name: Not on file  . Number of children: Not on file  . Years of education: Not on file  . Highest education level: Not on file  Occupational History  . Not on file  Social Needs  . Financial resource strain: Not on file  . Food insecurity:    Worry: Not on file    Inability: Not on file  . Transportation needs:    Medical: Not on file    Non-medical: Not on file  Tobacco Use  . Smoking status: Current Some Day Smoker    Packs/day: 0.00    Years: 40.00    Pack years: 0.00    Types: Cigarettes  . Smokeless tobacco: Never Used  Substance and Sexual Activity  . Alcohol use: No    Alcohol/week: 0.0 standard drinks  . Drug use: Yes    Comment: crack  . Sexual activity: Not on file  Lifestyle  . Physical activity:    Days per week: Not on file    Minutes per session: Not on file  . Stress: Not on file  Relationships  . Social connections:    Talks on phone: Not on file    Gets together: Not on file    Attends religious service: Not on file    Active member of club or organization: Not on file    Attends meetings of clubs or organizations: Not on file    Relationship status: Not on file  Other Topics Concern  . Not on file  Social History Narrative  . Not on file   Additional Social History:    Pain Medications: See MAR Prescriptions: See MAR Over the Counter: See MAR History of alcohol / drug use?: Yes Longest period of sobriety (when/how long): Unable to quantify Negative Consequences of Use: Financial, Personal relationships Name of Substance 1: Cocaine 1 - Age of First Use: Unable to quantify 1 - Amount (size/oz): Unable to quantify 1 - Frequency: Unable to quantify 1 - Duration: Unable to quantify 1 - Last Use / Amount: "a few days ago"                  Sleep: Fair  Appetite:   Fair  Current Medications: Current Facility-Administered Medications  Medication Dose Route Frequency Provider Last Rate Last Dose  . acetaminophen (TYLENOL) tablet 650 mg  650 mg Oral Q6H PRN Clapacs, John T, MD      . alum & mag hydroxide-simeth (MAALOX/MYLANTA) 200-200-20 MG/5ML suspension 30 mL  30 mL Oral Q4H PRN Clapacs, John T, MD      . aspirin EC tablet 81 mg  81 mg Oral Daily Clapacs, Madie Reno, MD   81 mg at 03/15/18 9323  . atorvastatin (LIPITOR) tablet 80 mg  80 mg Oral q1800 Clapacs, Madie Reno, MD   80 mg at 03/14/18 1720  . clopidogrel (PLAVIX) tablet 75 mg  75 mg Oral Daily Clapacs, Madie Reno, MD   75 mg at 03/15/18 5573  . collagenase (SANTYL) ointment   Topical Daily Einar Grad, Vonn Sliger, MD   1 application at 22/02/54 0840  . furosemide (LASIX) tablet 20 mg  20 mg Oral Daily Clapacs, Madie Reno, MD   20 mg at 03/15/18 2706  . gabapentin (NEURONTIN) capsule 100 mg  100 mg Oral TID Elvin So, MD   100 mg at 03/15/18 0838  . insulin aspart (novoLOG) injection 3 Units  3 Units Subcutaneous TID WC Clapacs, Madie Reno, MD   3 Units at 03/15/18 [phone]  . insulin glargine (LANTUS) injection 60 Units  60 Units Subcutaneous BID Clapacs, Madie Reno, MD   60 Units at 03/15/18 [phone]  . lisinopril (PRINIVIL,ZESTRIL) tablet 20 mg  20 mg Oral Daily Keena Heesch, MD      . magnesium hydroxide (MILK OF MAGNESIA) suspension 30 mL  30 mL Oral Daily PRN Clapacs, John T, MD      . metoprolol tartrate (LOPRESSOR) tablet 25 mg  25 mg Oral BID Clapacs, Madie Reno, MD   25 mg at 03/14/18 1728  . traZODone (DESYREL) tablet 100 mg  100 mg Oral QHS PRN Clapacs, Madie Reno, MD   100 mg at 03/14/18 2202    Lab Results:  Results for orders placed or performed during the hospital encounter of 03/13/18 (from the past 48 hour(s))  Glucose, capillary     Status: Abnormal   Collection Time: 03/13/18  5:00 PM  Result Value  Ref Range   Glucose-Capillary 174 (H) 70 - 99 mg/dL  Urinalysis, Complete w Microscopic     Status: Abnormal    Collection Time: 03/13/18  6:25 PM  Result Value Ref Range   Color, Urine STRAW (A) YELLOW   APPearance CLEAR (A) CLEAR   Specific Gravity, Urine 1.010 1.005 - 1.030   pH 5.0 5.0 - 8.0   Glucose, UA NEGATIVE NEGATIVE mg/dL   Hgb urine dipstick NEGATIVE NEGATIVE   Bilirubin Urine NEGATIVE NEGATIVE   Ketones, ur NEGATIVE NEGATIVE mg/dL   Protein, ur NEGATIVE NEGATIVE mg/dL   Nitrite NEGATIVE NEGATIVE   Leukocytes, UA NEGATIVE NEGATIVE   RBC / HPF 0-5 0 - 5 RBC/hpf   WBC, UA 0-5 0 - 5 WBC/hpf   Bacteria, UA MANY (A) NONE SEEN   Squamous Epithelial / LPF 0-5 0 - 5   Mucus PRESENT     Comment: Performed at [facility], [address] [place], [place] [address]  Urine Drug Screen, Qualitative     Status: None   Collection Time: 03/13/18  6:25 PM  Result Value Ref Range   Tricyclic, Ur Screen NONE DETECTED NONE DETECTED   Amphetamines, Ur Screen NONE DETECTED NONE DETECTED   MDMA (Ecstasy)Ur Screen NONE DETECTED NONE DETECTED   Cocaine Metabolite,Ur [place] NONE DETECTED NONE DETECTED   Opiate, Ur Screen NONE DETECTED NONE DETECTED   Phencyclidine (PCP) Ur S NONE DETECTED NONE DETECTED   Cannabinoid 50 Ng, Ur [place] NONE DETECTED NONE DETECTED   Barbiturates, Ur Screen NONE DETECTED NONE DETECTED   Benzodiazepine, Ur Scrn NONE DETECTED NONE DETECTED   Methadone Scn, Ur NONE DETECTED NONE DETECTED    Comment: (NOTE) Tricyclics + metabolites, urine    Cutoff 1000 ng/mL Amphetamines + metabolites, urine  Cutoff 1000 ng/mL MDMA (Ecstasy), urine              Cutoff 500 ng/mL Cocaine Metabolite, urine          Cutoff 300 ng/mL Opiate + metabolites, urine        Cutoff 300 ng/mL Phencyclidine (PCP), urine         Cutoff 25 ng/mL Cannabinoid, urine                 Cutoff 50 ng/mL Barbiturates + metabolites, urine  Cutoff 200 ng/mL Benzodiazepine, urine              Cutoff 200 ng/mL Methadone, urine                   Cutoff 300 ng/mL The urine drug screen provides only a preliminary,  unconfirmed analytical test result and should not be used for non-medical purposes. Clinical consideration and professional judgment should be applied to any positive drug screen result due to possible interfering substances. A more specific alternate chemical method must be used in order to obtain a confirmed analytical result. Gas chromatography / mass spectrometry (GC/MS) is the preferred confirmat ory method. Performed at [facility], [address] [place], [place] [address]   Glucose, capillary     Status: Abnormal   Collection Time: 03/13/18  8:35 PM  Result Value Ref Range   Glucose-Capillary 111 (H) 70 - 99 mg/dL   Comment 1 Notify RN   Basic metabolic panel     Status: Abnormal   Collection Time: 03/14/18  7:07 AM  Result Value Ref Range   Sodium 143 135 - 145 mmol/L   Potassium 4.5 3.5 - 5.1 mmol/L  Chloride 111 98 - 111 mmol/L   CO2 26 22 - 32 mmol/L   Glucose, Bld 122 (H) 70 - 99 mg/dL   BUN 32 (H) 8 - 23 mg/dL   Creatinine, Ser 1.38 (H) 0.44 - 1.00 mg/dL   Calcium 8.4 (L) 8.9 - 10.3 mg/dL   GFR calc non Af Amer 39 (L) >60 mL/min   GFR calc Af Amer 46 (L) >60 mL/min    Comment: (NOTE) The eGFR has been calculated using the CKD EPI equation. This calculation has not been validated in all clinical situations. eGFR's persistently <60 mL/min signify possible Chronic Kidney Disease.    Anion gap 6 5 - 15    Comment: Performed at [facility], [address] [place], [place] [address]  Hemoglobin A1c     Status: Abnormal   Collection Time: 03/14/18  7:07 AM  Result Value Ref Range   Hgb A1c MFr Bld 7.9 (H) 4.8 - 5.6 %    Comment: (NOTE) Pre diabetes:          5.7%-6.4% Diabetes:              >6.4% Glycemic control for   <7.0% adults with diabetes    Mean Plasma Glucose 180.03 mg/dL    Comment: Performed at [facility] [address], [place], [place] [address]  Lipid panel     Status: Abnormal   Collection Time: 03/14/18  7:07  AM  Result Value Ref Range   Cholesterol 92 0 - 200 mg/dL   Triglycerides 71 <150 mg/dL   HDL 39 (L) >40 mg/dL   Total CHOL/HDL Ratio 2.4 RATIO   VLDL 14 0 - 40 mg/dL   LDL Cholesterol 39 0 - 99 mg/dL    Comment:        Total Cholesterol/HDL:CHD Risk Coronary Heart Disease Risk Table                     Men   Women  1/2 Average Risk   3.4   3.3  Average Risk       5.0   4.4  2 X Average Risk   9.6   7.1  3 X Average Risk  23.4   11.0        Use the calculated Patient Ratio above and the CHD Risk Table to determine the patient's CHD Risk.        ATP III CLASSIFICATION (LDL):  <100     mg/dL   Optimal  100-129  mg/dL   Near or Above                    Optimal  130-159  mg/dL   Borderline  160-189  mg/dL   High  >190     mg/dL   Very High Performed at [facility], [address] [place], [place] [address]   TSH     Status: None   Collection Time: 03/14/18  7:07 AM  Result Value Ref Range   TSH 0.545 0.350 - 4.500 uIU/mL    Comment: Performed by a 3rd Generation assay with a functional sensitivity of <=0.01 uIU/mL. Performed at [facility], [address] [place], [place] [address]   Glucose, capillary     Status: Abnormal   Collection Time: 03/14/18  7:08 AM  Result Value Ref Range   Glucose-Capillary 115 (H) 70 - 99 mg/dL   Comment 1 Notify RN   Glucose, capillary  Status: Abnormal   Collection Time: 03/14/18 11:23 AM  Result Value Ref Range   Glucose-Capillary 141 (H) 70 - 99 mg/dL   Comment 1 Notify RN   Glucose, capillary     Status: Abnormal   Collection Time: 03/14/18  4:20 PM  Result Value Ref Range   Glucose-Capillary 140 (H) 70 - 99 mg/dL  Glucose, capillary     Status: Abnormal   Collection Time: 03/14/18  8:30 PM  Result Value Ref Range   Glucose-Capillary 113 (H) 70 - 99 mg/dL   Comment 1 Notify RN   Glucose, capillary     Status: Abnormal   Collection Time: 03/15/18  7:10 AM  Result Value Ref Range   Glucose-Capillary 140  (H) 70 - 99 mg/dL   Comment 1 Notify RN     Blood Alcohol level:  No results found for: [facility]  Metabolic Disorder Labs: Lab Results  Component Value Date   HGBA1C 7.9 (H) 03/14/2018   MPG 180.03 03/14/2018   MPG 289.09 05/14/2017   No results found for: PROLACTIN Lab Results  Component Value Date   CHOL 92 03/14/2018   TRIG 71 03/14/2018   HDL 39 (L) 03/14/2018   CHOLHDL 2.4 03/14/2018   VLDL 14 03/14/2018   LDLCALC 39 03/14/2018   LDLCALC 103 (H) 10/10/2016    Physical Findings: AIMS:  , ,  ,  ,    CIWA:    COWS:     Musculoskeletal: Strength & Muscle Tone: abnormal Gait & Station: broad based Patient leans: Front  Psychiatric Specialty Exam: Physical Exam  ROS  Blood pressure (!) 72/40, pulse 79, temperature 98.6 F (37 C), temperature source Oral, resp. rate 16, height _0  (1.727 m), weight 115.7 kg, SpO2 100 %.Body mass index is 38.77 kg/m.  General Appearance: Casual  Eye Contact:  Fair  Speech:  Clear and Coherent  Volume:  Increased  Mood:  Depressed, Dysphoric and Irritable  Affect:  Constricted and Depressed  Thought Process:  Coherent  Orientation:  Full (Time, Place, and Person)  Thought Content:  Logical  Suicidal Thoughts:  Yes.  with intent/plan  Homicidal Thoughts:  No  Memory:  Immediate;   Fair Recent;   Fair Remote;   Fair  Judgement:  Impaired  Insight:  Lacking  Psychomotor Activity:  Decreased  Concentration:  Concentration: Fair and Attention Span: Fair  Recall:  [employer] of Knowledge:  Fair  Language:  Fair  Akathisia:  No  Handed:  Right  AIMS (if indicated):     Assets:  Communication Skills Desire for Improvement  ADL's:  Intact  Cognition:  WNL  Sleep:  Number of Hours: 5.75     Treatment Plan Summary: Daily contact with patient to assess and evaluate symptoms and progress in treatment and Medication management   Major Depressive Disorder -Start prozac at 73m po qd. -monitor for mood and safety -attend  groups and develop coping skills  Suicidal ideations -develop action action alternatives for suicidal thoughts -develop coping skills to deal with negative thoughts.  Cocaine abuse -patient to consider substance abuse programs upon discharge  Diabetes -continue insulin lantus 60 units -insulin novolog 3 units tid with meals  Hypertension -continue lisinopril 280mdaily(was decreased from 40-20 yesterday due to low blood pressure)  Wound on Left foot -consult called - WOC nurse following   Dizziness - PT consult called , patient recommended to use walker and not be in the wheelchair all day  Diabetic Neuropathy -increase Neurontin  to 443m po tid (patient insists she has been taking this dosage regularly and has not missed it in the last few weeks)  Hyperlipidemia -continue lipitor at 897mpo qd  Coronary artery disease -continue plavix at 758mo qd -lasix 37m11m qd   Labs -lipid panel - hdl low, elevated LDL -hgA1c 7.9 -Blood glucose this am 140 -TSH normal -CMP shows decreased calcium, elevated BUN, creatinine  Discharge -safety and stability    HimaElvin So 03/15/2018, 9:14 AM

## 2018-03-15 NOTE — [facility] Group Notes (Signed)
03/15/2018 1PM  Type of Therapy and Topic:  Group Therapy:  Feelings around Relapse and Recovery  Participation Level:  Active   Description of Group:    Patients in this group will discuss emotions they experience before and after a relapse. They will process how experiencing these feelings, or avoidance of experiencing them, relates to having a relapse. Facilitator will guide patients to explore emotions they have related to recovery. Patients will be encouraged to process which emotions are more powerful. They will be guided to discuss the emotional reaction significant others in their lives may have to patients' relapse or recovery. Patients will be assisted in exploring ways to respond to the emotions of others without this contributing to a relapse.  Therapeutic Goals: 1. Patient will identify two or more emotions that lead to a relapse for them 2. Patient will identify two emotions that result when they relapse 3. Patient will identify two emotions related to recovery 4. Patient will demonstrate ability to communicate their needs through discussion and/or role plays   Summary of Patient Progress: Actively and appropriately engaged in the group. Patient was able to provide support and validation to other group members.Marshae reports "I felt weak. Like How did I get here". She was able to talk about things that she can do to work towards recovery such as focusing on herself. Patient practiced active listening when interacting with the facilitator and other group members. Patient is still in the process of obtaining treatment goals.      Therapeutic Modalities:   Cognitive Behavioral Therapy Solution-Focused Therapy Assertiveness Training Relapse Prevention Therapy   Darin Engels, [place] 03/15/2018 1:59 PM

## 2018-03-15 NOTE — Plan of Care (Signed)
  Problem: Coping: Goal: Coping ability will improve Outcome: Progressing  Patient interacting with peers ad staff this evening appears less anxious.

## 2018-03-15 NOTE — Progress Notes (Signed)
Recreation Therapy Notes  INPATIENT RECREATION THERAPY ASSESSMENT  Patient Details Name: Melinda [name] MRN: [id number] DOB: 11/24/1955 Today's Date: 03/15/2018       Information Obtained From: Patient  Able to Participate in Assessment/Interview: Yes  Patient Presentation: Responsive  Reason for Admission (Per Patient): Active Symptoms, Substance Abuse, Other (Comments)(Depression)  Patient Stressors:    Coping Skills:   Building control surveyor, Music, Substance Abuse, Avoidance  Leisure Interests (2+):  Music - Singing(Get high)  Frequency of Recreation/Participation: Monthly  Awareness of Community Resources:  Yes  Community Resources:  [employer]  Current Use:    If no, Barriers?:    Expressed Interest in [employer] Information:    [place] of Residence:  Insurance underwriter  Patient Main Form of Transportation: Other (Comment)(My friend)  Patient Strengths:  Good person, helping others  Patient Identified Areas of Improvement:  Get help with my disease and sticking with it  Patient Goal for Hospitalization:  Get treatment and making it to my 63th birthday.  Current SI (including self-harm):  No  Current HI:  No  Current AVH: No  Staff Intervention Plan: Group Attendance, Collaborate with Interdisciplinary Treatment Team  Consent to Intern Participation: N/A  Melinda [name] 03/15/2018, 8:32 AM

## 2018-03-15 NOTE — Progress Notes (Signed)
Patient is alert and oriented x 4, endorses pain in her feet and hand, voiced complaint that she normally takes gabapentin 1200 mg at home and has only been give 100 mg. MD on call notified and order for 800 mg was given. Patient's affect is flat and sad but brightens upon approach, thoughts are organized and coherent, 15 minutes safety checks maintained will continue to monitor closely

## 2018-03-15 NOTE — Progress Notes (Signed)
Recreation Therapy Notes   Date: 03/15/2018  Time: 9:30 am   Location: Craft room   Behavioral response: N/A   Intervention Topic: Problem Solving  Discussion/Intervention: Patient did not attend group.   Clinical Observations/Feedback:  Patient did not attend group.   Javaya [place] LRT/CTRS        Caprice Mccaffrey 03/15/2018 11:32 AM

## 2018-03-16 DIAGNOSIS — F332 Major depressive disorder, recurrent severe without psychotic features: Principal | ICD-10-CM

## 2018-03-16 LAB — GLUCOSE, CAPILLARY
Glucose-Capillary: 137 mg/dL — ABNORMAL HIGH (ref 70–99)
Glucose-Capillary: 246 mg/dL — ABNORMAL HIGH (ref 70–99)
Glucose-Capillary: 128 mg/dL — ABNORMAL HIGH (ref 70–99)
Glucose-Capillary: 118 mg/dL — ABNORMAL HIGH (ref 70–99)

## 2018-03-16 MED ORDER — METOPROLOL TARTRATE 25 MG PO TABS
12.5000 mg | ORAL_TABLET | Freq: Two times a day (BID) | ORAL | Status: DC
  Administered 2018-03-17 – 2018-03-21 (×9): 12.5 mg via ORAL
  Filled 2018-03-16 (×9): qty 1

## 2018-03-16 MED ORDER — LISINOPRIL 5 MG PO TABS
5.0000 mg | ORAL_TABLET | Freq: Every day | ORAL | Status: DC
  Administered 2018-03-17 – 2018-03-21 (×5): 5 mg via ORAL
  Filled 2018-03-16 (×5): qty 1

## 2018-03-16 MED ORDER — SIMETHICONE 80 MG PO CHEW
80.0000 mg | CHEWABLE_TABLET | Freq: Four times a day (QID) | ORAL | Status: DC | PRN
  Administered 2018-03-16 – 2018-03-18 (×4): 80 mg via ORAL
  Filled 2018-03-16 (×4): qty 1

## 2018-03-16 MED ORDER — FUROSEMIDE 20 MG PO TABS: 20.0000 mg | ORAL_TABLET | Freq: Every day | ORAL | Status: DC | PRN

## 2018-03-16 NOTE — Plan of Care (Signed)
Patient was in her room most of the shift only out for her medicines,patient  has been calm and cooperative no episode of agitations or violence,  rate depression at 6/10, contract for safety of self and others encourage patient to participate in groups with peers, patient denies any SI/HI/ AVH, denies pain at this time , 15 minute safety rounding is in progress no distress..   Problem: Coping: Goal: Coping ability will improve Outcome: Progressing Goal: Will verbalize feelings Outcome: Progressing   Problem: Health Behavior/Discharge Planning: Goal: Ability to make decisions will improve Outcome: Progressing Goal: Compliance with therapeutic regimen will improve Outcome: Progressing   Problem: Role Relationship: Goal: Will demonstrate positive changes in social behaviors and relationships Outcome: Progressing   Problem: Safety: Goal: Ability to disclose and discuss suicidal ideas will improve Outcome: Progressing Goal: Ability to identify and utilize support systems that promote safety will improve Outcome: Progressing   Problem: Self-Concept: Goal: Will verbalize positive feelings about self Outcome: Progressing   Problem: Health Behavior/Discharge Planning: Goal: Ability to identify changes in lifestyle to reduce recurrence of condition will improve Outcome: Progressing   Problem: Physical Regulation: Goal: Complications related to the disease process, condition or treatment will be avoided or minimized Outcome: Progressing   Problem: Safety: Goal: Ability to remain free from injury will improve Outcome: Progressing   Problem: Safety: Goal: Ability to remain free from injury will improve Outcome: Progressing

## 2018-03-16 NOTE — Plan of Care (Addendum)
Patient is responding well to treatment, compliant with her therapeutic regimen and maintaining safety in the unit, BP is monitored and blood sugar is also monitored, patient is calm , less interactions with peers, much so to stay in her room resting comfortably, verbally contract for safety and sleep long hours, appetite is good no distress.  0330  Patient was up and out of her room and demand snacks, states she is hungry, diabetic snacks offered to patient, she eat and was full, and went back to her room satisfied no further issues. Noted.   Problem: Coping: Goal: Coping ability will improve Outcome: Progressing Goal: Will verbalize feelings Outcome: Progressing   Problem: Health Behavior/Discharge Planning: Goal: Ability to make decisions will improve Outcome: Progressing Goal: Compliance with therapeutic regimen will improve Outcome: Progressing   Problem: Role Relationship: Goal: Will demonstrate positive changes in social behaviors and relationships Outcome: Progressing   Problem: Safety: Goal: Ability to disclose and discuss suicidal ideas will improve Outcome: Progressing Goal: Ability to identify and utilize support systems that promote safety will improve Outcome: Progressing   Problem: Self-Concept: Goal: Will verbalize positive feelings about self Outcome: Progressing   Problem: Health Behavior/Discharge Planning: Goal: Ability to identify changes in lifestyle to reduce recurrence of condition will improve Outcome: Progressing   Problem: Physical Regulation: Goal: Complications related to the disease process, condition or treatment will be avoided or minimized Outcome: Progressing   Problem: Safety: Goal: Ability to remain free from injury will improve Outcome: Progressing   Problem: Safety: Goal: Ability to remain free from injury will improve Outcome: Progressing

## 2018-03-16 NOTE — Progress Notes (Signed)
D: Pt denies SI/HI/AVH, contracts verbally for safety. Pt is pleasant and cooperative with staff and peers, no behavioral issues to report. Patient Interactions appropriate. Pt. Affect brighter today, and reports there her depression, "comes and goes, but when it comes it's really bad". Denies anxiety. Pt. Reports groups and unit activities have been helping, that and being around other people.  A: Q x 15 minute observation checks were completed for safety. Patient was provided with education, but needs reinforcement.  Patient was given/offered medications per orders. Patient  was encourage to attend groups, participate in unit activities and continue with plan of care. Pt. Chart and plans of care reviewed. Pt. Given support and encouragement.   R: Patient is complaint with medication and unit procedures. Pt. Given extensive falls risk education. Pt. Blood sugars monitored per MD orders for safety. Pt. Reported headache earlier in there day that was treated with MD orders with good results. Pt. Wound care completed per wound care and MD orders. Pt. Blood pressure low throughout the day. Hospitalist consult came to see the patient about changing blood pressure medications. Pt. Blood pressure still low later in the shift, so medications throughout the day held per MD orders.                Precautionary checks every 15 minutes for safety maintained, room free of safety hazards, patient sustains no injury or falls during this shift. Will endorse care to next shift.

## 2018-03-16 NOTE — Plan of Care (Signed)
Patient is complaint with medications. Pt. Denies si/hi, contracts for safety. Pt. Reports she can remain safe while on the unit. Pt. Reporting more positive mood and affect is brighter today.    Problem: Health Behavior/Discharge Planning: Goal: Compliance with therapeutic regimen will improve Outcome: Progressing   Problem: Safety: Goal: Ability to disclose and discuss suicidal ideas will improve Outcome: Progressing   Problem: Self-Concept: Goal: Will verbalize positive feelings about self Outcome: Progressing   Problem: Safety: Goal: Ability to remain free from injury will improve Outcome: Progressing   Problem: Safety: Goal: Ability to remain free from injury will improve Outcome: Progressing

## 2018-03-16 NOTE — [facility] Group Notes (Signed)
LCSW Group Therapy Note  03/16/2018 1:15pm  Type of Therapy and Topic:  Group Therapy:  Cognitive Distortions  Participation Level:  Active   Description of Group:    Patients in this group will be introduced to the topic of cognitive distortions.  Patients will identify and describe cognitive distortions, describe the feelings these distortions create for them.  Patients will identify one or more situations in their personal life where they have cognitively distorted thinking and will verbalize challenging this cognitive distortion through positive thinking skills.  Patients will practice the skill of using positive affirmations to challenge cognitive distortions using affirmation cards.    Therapeutic Goals:  1. Patient will identify two or more cognitive distortions they have used 2. Patient will identify one or more emotions that stem from use of a cognitive distortion 3. Patient will demonstrate use of a positive affirmation to counter a cognitive distortion through discussion and/or role play. 4. Patient will describe one way cognitive distortions can be detrimental to wellness   Summary of Patient Progress: The patient reported that  she feels "okay." Patients were introduced to the topic of cognitive distortions. The patient was able to identify and describe cognitive distortions, described the feelings these distortions create for  her. Patient identified a situation in her personal life where she has cognitively distorted thinking and was able to verbalize and challenged this cognitive distortion through positive thinking skills. Patient was able to provide support and validation to other group members.     Therapeutic Modalities:   Cognitive Behavioral Therapy Motivational Interviewing   Dalisha Shively  CUEBAS-COLON, LCSW 03/16/2018 11:29 AM

## 2018-03-16 NOTE — Progress Notes (Signed)
[facility] MD Progress Note  03/16/2018 6:20 PM Melinda [name]  MRN:  [id number] Subjective: Patient is a 62 year old woman with a long history of multiple medical problems including diabetes, hyper tension, peripheral neuropathy, ischemic cardiomyopathy and the use of drugs and alcohol to the emergency room complaining of a fall but then reported she was very depressed and that she was abusing cocaine.   Pt seen and chart reviewed. She is calm and cooperative and reports feeling better today. She is a bit worried about her BP, which has been running low and was glad to see our Hospitalist, who came and adjusted  Her anti-HTN meds.   Otherwise, no SI or Hi, tolerates prozad.   Principal Problem: Severe recurrent major depression without psychotic features ([place]) Diagnosis:   Patient Active Problem List   Diagnosis Date Noted  . Severe recurrent major depression without psychotic features ([place]) [F33.2] 03/12/2018  . Cocaine abuse ([place]) [F14.10] 03/12/2018  . Suicidal ideation [R45.851] 03/12/2018  . Atherosclerosis of native arteries of the extremities with ulceration ([place]) [I70.25] 06/19/2017  . Gangrene of toe of left foot ([place]) [I96] 05/14/2017  . CAD (coronary artery disease), native coronary artery [I25.10] 05/05/2017  . Claudication in peripheral vascular disease ([place]) [I73.9] 10/18/2016  . Hyperlipidemia LDL goal <70 [E78.5] 10/18/2016  . Onychomycosis [B35.1] 10/10/2016  . Medication monitoring encounter [Z51.81] 10/10/2016  . Amputated toe, left ([place]) [M08.676P] 10/10/2016  . Amputated toe, right ([place]) [P50.932I] 10/10/2016  . Osteomyelitis of left foot ([place]) [M86.9] 10/21/2015  . Hyperglycemia [R73.9] 10/02/2015  . IDDM (insulin dependent diabetes mellitus) ([place]) [E11.9, Z79.4]   . Peripheral neuropathy [G62.9]   . Tobacco abuse [Z72.0]   . Obesity [E66.9]   . Ischemic cardiomyopathy [I25.5]   . 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 HTN (hypertension) [I10]   . DM2 (diabetes  mellitus, type 2) ([place]) [E11.9]   . GERD (gastroesophageal reflux disease) [K21.9]   . NSTEMI (non-ST elevated myocardial infarction) ([place]) [I21.4] 06/03/2014   Total Time spent with patient: 20 minutes  Past Psychiatric History: Patient has been hospitalized once for psychiatric reasons and has been on multiple medications which she cannot remember.  Most recently she had been taking Lexapro.  Past Medical History:  Past Medical History:  Diagnosis Date  . Anxiety   . Coronary artery disease    a. NSTEMI 06/2014; b. 4 vessel CABG 06/08/2014 LIMA to LAD, SVG to Diagonal, SVG to OM, & SVG to PDA  . Diabetic neuropathy ([place])   . GERD (gastroesophageal reflux disease)   . Heart attack ([place])   . HTN (hypertension)   . IDDM (insulin dependent diabetes mellitus) ([place])   . Ischemic cardiomyopathy    a. echo 06/04/14 EF 40-45%, HK of entire inferolateral and inferior myocardium c.w infarct of RCA/LCx, GR2DD, mild MR  . OA (osteoarthritis) of knee   . Obesity   . Osteoarthritis   . Osteomyelitis ([place])   . Peripheral neuropathy   . Peripheral vascular disease ([place])   . Tobacco abuse     Past Surgical History:  Procedure Laterality Date  . ABDOMINAL AORTOGRAM W/LOWER EXTREMITY N/A 10/27/2016   Procedure: Abdominal Aortogram w/Lower Extremity;  Surgeon: Nelva Bush, MD;  Location: [place] [facility];  Service: Cardiovascular;  Laterality: N/A;  . AMPUTATION TOE Left 10/21/2015   Procedure: AMPUTATION TOE;  Surgeon: Sharlotte Alamo, DPM;  Location: [facility];  Service: Podiatry;  Laterality: Left;  . AMPUTATION TOE Left 05/15/2017   Procedure: AMPUTATION LEFT GREAT  TOE;  Surgeon: Sharlotte Alamo, DPM;  Location: [facility];  Service: Podiatry;  Laterality: Left;  . AORTIC VALVE REPLACEMENT (AVR)/CORONARY ARTERY BYPASS GRAFTING (CABG)    . BREAST BIOPSY    . CARDIAC CATHETERIZATION  06/2014   95% stenosis mLAD, occlusion ostial OM1, 70% stenosis LCx, 95% stenosis mRCA, EF 45%.  . CORONARY ARTERY BYPASS  GRAFT N/A 06/08/2014   Procedure: CORONARY ARTERY BYPASS GRAFTING (CABG);  Surgeon: Grace Isaac, MD;  Location: [place];  Service: Open Heart Surgery;  Laterality: N/A;  Times 4 using left internal mammary artery to LAD artery and endoscopically harvested bilateral saphenous vein to Obtuse Marginal, Diagonal and Posterior Descending coronary arteries.  . CT ABD W & PELVIS WO CM  06/2014   nl liver, gallbladder, spleen, mild diverticular changes, no bowel wall inflammation, appendix nl, no hernia, no other sig abnormalities  . LOWER EXTREMITY ANGIOGRAPHY Left 05/23/2017   Procedure: LOWER EXTREMITY ANGIOGRAPHY;  Surgeon: Algernon Huxley, MD;  Location: [place] [facility];  Service: Cardiovascular;  Laterality: Left;  . LOWER EXTREMITY ANGIOGRAPHY Left 05/28/2017   Procedure: LOWER EXTREMITY ANGIOGRAPHY;  Surgeon: Algernon Huxley, MD;  Location: [place] [facility];  Service: Cardiovascular;  Laterality: Left;  . TEE WITHOUT CARDIOVERSION N/A 06/08/2014   Procedure: TRANSESOPHAGEAL ECHOCARDIOGRAM (TEE);  Surgeon: Grace Isaac, MD;  Location: [place];  Service: Open Heart Surgery;  Laterality: N/A;  . TOE AMPUTATION Right 12/2011   rt middle toe  . US ECHOCARDIOGRAPHY  06/2014   EF 50-55%, HK of inf/post/inferolat walls, Ao sclerosis   Family History:  Family History  Problem Relation Age of Onset  . CAD Mother   . Hypertension Mother   . Diabetes Mother   . CAD Father   . Diabetes Father   . Sickle cell trait Other   . Asthma Other    Family Psychiatric  History: unknown Social History:  Social History   Substance and Sexual Activity  Alcohol Use No  . Alcohol/week: 0.0 standard drinks     Social History   Substance and Sexual Activity  Drug Use Yes   Comment: crack    Social History   Socioeconomic History  . Marital status: Widowed    Spouse name: Not on file  . Number of children: Not on file  . Years of education: Not on file  . Highest education level: Not on file   Occupational History  . Not on file  Social Needs  . Financial resource strain: Not on file  . Food insecurity:    Worry: Not on file    Inability: Not on file  . Transportation needs:    Medical: Not on file    Non-medical: Not on file  Tobacco Use  . Smoking status: Current Some Day Smoker    Packs/day: 0.00    Years: 40.00    Pack years: 0.00    Types: Cigarettes  . Smokeless tobacco: Never Used  Substance and Sexual Activity  . Alcohol use: No    Alcohol/week: 0.0 standard drinks  . Drug use: Yes    Comment: crack  . Sexual activity: Not on file  Lifestyle  . Physical activity:    Days per week: Not on file    Minutes per session: Not on file  . Stress: Not on file  Relationships  . Social connections:    Talks on phone: Not on file    Gets together: Not on file    Attends religious service:  Not on file    Active member of club or organization: Not on file    Attends meetings of clubs or organizations: Not on file    Relationship status: Not on file  Other Topics Concern  . Not on file  Social History Narrative  . Not on file   Additional Social History:    Pain Medications: See MAR Prescriptions: See MAR Over the Counter: See MAR History of alcohol / drug use?: Yes Longest period of sobriety (when/how long): Unable to quantify Negative Consequences of Use: Financial, Personal relationships Name of Substance 1: Cocaine 1 - Age of First Use: Unable to quantify 1 - Amount (size/oz): Unable to quantify 1 - Frequency: Unable to quantify 1 - Duration: Unable to quantify 1 - Last Use / Amount: "a few days ago"  Sleep: Fair  Appetite:  Fair  Current Medications: Current Facility-Administered Medications  Medication Dose Route Frequency Provider Last Rate Last Dose  . acetaminophen (TYLENOL) tablet 650 mg  650 mg Oral Q6H PRN Clapacs, Madie Reno, MD   650 mg at 03/16/18 0942  . alum & mag hydroxide-simeth (MAALOX/MYLANTA) 200-200-20 MG/5ML suspension 30 mL   30 mL Oral Q4H PRN Clapacs, John T, MD      . aspirin EC tablet 81 mg  81 mg Oral Daily Clapacs, Madie Reno, MD   81 mg at 03/16/18 0924  . atorvastatin (LIPITOR) tablet 80 mg  80 mg Oral q1800 Clapacs, Madie Reno, MD   80 mg at 03/16/18 1744  . clopidogrel (PLAVIX) tablet 75 mg  75 mg Oral Daily Clapacs, Madie Reno, MD   75 mg at 03/16/18 0924  . collagenase (SANTYL) ointment   Topical Daily Elvin So, MD   1 application at 61/60/73 0906  . FLUoxetine (PROZAC) capsule 10 mg  10 mg Oral Daily Ravi, Himabindu, MD   10 mg at 03/16/18 7106  . furosemide (LASIX) tablet 20 mg  20 mg Oral Daily PRN Henreitta Leber, MD      . gabapentin (NEURONTIN) capsule 400 mg  400 mg Oral TID Einar Grad, Himabindu, MD   400 mg at 03/16/18 1707  . insulin aspart (novoLOG) injection 3 Units  3 Units Subcutaneous TID WC Clapacs, Madie Reno, MD   3 Units at 03/16/18 1655  . insulin glargine (LANTUS) injection 60 Units  60 Units Subcutaneous BID Clapacs, Madie Reno, MD   60 Units at 03/16/18 1653  . [START ON 03/17/2018] lisinopril (PRINIVIL,ZESTRIL) tablet 5 mg  5 mg Oral Daily Sainani, Vivek J, MD      . magnesium hydroxide (MILK OF MAGNESIA) suspension 30 mL  30 mL Oral Daily PRN Clapacs, Madie Reno, MD   30 mL at 03/15/18 1714  . metoprolol tartrate (LOPRESSOR) tablet 12.5 mg  12.5 mg Oral BID Henreitta Leber, MD   Stopped at 03/16/18 1722  . simethicone (MYLICON) chewable tablet 80 mg  80 mg Oral Q6H PRN Dyshon Philbin, MD   80 mg at 03/16/18 1748  . traZODone (DESYREL) tablet 100 mg  100 mg Oral QHS PRN Clapacs, Madie Reno, MD   100 mg at 03/14/18 2202    Lab Results:  Results for orders placed or performed during the hospital encounter of 03/13/18 (from the past 48 hour(s))  Glucose, capillary     Status: Abnormal   Collection Time: 03/14/18  8:30 PM  Result Value Ref Range   Glucose-Capillary 113 (H) 70 - 99 mg/dL   Comment 1 Notify RN   Glucose, capillary  Status: Abnormal   Collection Time: 03/15/18  7:10 AM  Result Value Ref Range    Glucose-Capillary 140 (H) 70 - 99 mg/dL   Comment 1 Notify RN   Glucose, capillary     Status: Abnormal   Collection Time: 03/15/18 11:21 AM  Result Value Ref Range   Glucose-Capillary 151 (H) 70 - 99 mg/dL   Comment 1 Notify RN   Glucose, capillary     Status: Abnormal   Collection Time: 03/15/18  4:18 PM  Result Value Ref Range   Glucose-Capillary 200 (H) 70 - 99 mg/dL   Comment 1 Notify RN   Glucose, capillary     Status: None   Collection Time: 03/15/18  8:51 PM  Result Value Ref Range   Glucose-Capillary 86 70 - 99 mg/dL   Comment 1 Notify RN   Glucose, capillary     Status: Abnormal   Collection Time: 03/16/18  7:11 AM  Result Value Ref Range   Glucose-Capillary 137 (H) 70 - 99 mg/dL   Comment 1 Notify RN   Glucose, capillary     Status: Abnormal   Collection Time: 03/16/18 11:25 AM  Result Value Ref Range   Glucose-Capillary 246 (H) 70 - 99 mg/dL  Glucose, capillary     Status: Abnormal   Collection Time: 03/16/18  4:24 PM  Result Value Ref Range   Glucose-Capillary 128 (H) 70 - 99 mg/dL    Blood Alcohol level:  No results found for: [facility]  Metabolic Disorder Labs: Lab Results  Component Value Date   HGBA1C 7.9 (H) 03/14/2018   MPG 180.03 03/14/2018   MPG 289.09 05/14/2017   No results found for: PROLACTIN Lab Results  Component Value Date   CHOL 92 03/14/2018   TRIG 71 03/14/2018   HDL 39 (L) 03/14/2018   CHOLHDL 2.4 03/14/2018   VLDL 14 03/14/2018   LDLCALC 39 03/14/2018   LDLCALC 103 (H) 10/10/2016    Physical Findings: AIMS:  , ,  ,  ,    CIWA:    COWS:     Musculoskeletal: Strength & Muscle Tone: abnormal Gait & Station: broad based Patient leans: Front  Psychiatric Specialty Exam: Physical Exam   ROS   Blood pressure (!) 116/55, pulse 84, temperature 98.5 F (36.9 C), temperature source Oral, resp. rate 18, height 5\' 8"  (1.727 m), weight 115.7 kg, SpO2 100 %.Body mass index is 38.77 kg/m.  General Appearance: Casual and Fairly Groomed   Eye Contact:  Good  Speech:  Clear and Coherent and Normal Rate  Volume:  Normal  Mood:  Depressed and Dysphoric  Affect:  Constricted and Depressed  Thought Process:  Coherent and Linear  Orientation:  Full (Time, Place, and Person)  Thought Content:  Logical  Suicidal Thoughts:  No  Homicidal Thoughts:  No  Memory:  Immediate;   Fair Recent;   Fair Remote;   Fair  Judgement:  Impaired  Insight:  Lacking  Psychomotor Activity:  Decreased  Concentration:  Concentration: Fair and Attention Span: Fair  Recall:  [employer] of Knowledge:  Fair  Language:  Fair  Akathisia:  No  Handed:  Right  AIMS (if indicated):     Assets:  Communication Skills Desire for Improvement  ADL's:  Intact  Cognition:  WNL  Sleep:  Number of Hours: 8.15     Treatment Plan Summary: Daily contact with patient to assess and evaluate symptoms and progress in treatment and Medication management   Major Depressive Disorder -Continue  prozac at 10mg  po qd. -monitor for mood and safety -attend groups and develop coping skills  Suicidal ideations -develop action action alternatives for suicidal thoughts -develop coping skills to deal with negative thoughts.  Cocaine abuse -patient to consider substance abuse programs upon discharge  Diabetes; fairly controlled with the current regimen. -continue insulin lantus 60 units -insulin novolog 3 units tid with meals  Hypertension -- Hospitalist consulted as pt 's DBP was <60 for several occasions.  -- Per hospitalist recommendation:  -- reduced  To lisinopril 10mg  daily (was decreased from 40-20).  -- reduced Metoprolol to 12.5mg  BID from 25mg  BID -- STOP Lasix 20mg  daily.    Wound on Left foot -consult called - WOC nurse following   Dizziness; could be from low BP - PT consult called , patient recommended to use walker and not be in the wheelchair all day  Diabetic Neuropathy -- continue  Neurontin to 400mg  po tid (patient insists she  has been taking this dosage regularly and has not missed it in the last few weeks)  Hyperlipidemia -continue lipitor at 80mg  po qd  Coronary artery disease -continue plavix at 75mg  po qd - STOPPED lasix 20mg  po qd by hospitalist due to low BP.    Labs -lipid panel - hdl low, elevated LDL -hgA1c 7.9 -Blood glucose this am 140 -TSH normal -CMP shows decreased calcium, elevated BUN, creatinine  Discharge -safety and stability    Corry Storie, MD 03/16/2018, 6:20 PM

## 2018-03-16 NOTE — Consult Note (Signed)
[facility] [place] NAME: Melinda [name]    MR#:  [id number]  DATE OF BIRTH:  1956/03/07  DATE OF CONSULT:  03/16/2018  PRIMARY CARE PHYSICIAN: Donnie Coffin, MD   REQUESTING/REFERRING PHYSICIAN: Dr. He  CHIEF COMPLAINT:  No chief complaint on file. Hypotension  HISTORY OF PRESENT ILLNESS:  Melinda [name]  is a 62 y.o. female with a known history of anxiety, coronary artery disease status post previous bypass, diabetes, diabetic neuropathy, GERD, previous history of MI, ischemic cardiomyopathy ejection fraction of 40 to 45%, obesity, osteoarthritis, peripheral neuropathy, ongoing tobacco abuse who was admitted to the behavioral medicine secondary to depression with suicidal ideations and now was noted to be relatively hypotensive and hospitalist services were contacted for further evaluation.  Patient has had some mild hypotension while in the hospital but has been asymptomatic.  Hospitalist services were contacted for adjusting her blood pressure/cardiac meds.  Patient denies any chest pains, shortness of breath, nausea, vomiting, diarrhea.  She admits to occasional dizziness but denies any true syncope or loss of consciousness.  PAST MEDICAL HISTORY:   Past Medical History:  Diagnosis Date  . Anxiety   . Coronary artery disease    a. NSTEMI 06/2014; b. 4 vessel CABG 06/08/2014 LIMA to LAD, SVG to Diagonal, SVG to OM, & SVG to PDA  . Diabetic neuropathy ([place])   . GERD (gastroesophageal reflux disease)   . Heart attack ([place])   . HTN (hypertension)   . IDDM (insulin dependent diabetes mellitus) ([place])   . Ischemic cardiomyopathy    a. echo 06/04/14 EF 40-45%, HK of entire inferolateral and inferior myocardium c.w infarct of RCA/LCx, GR2DD, mild MR  . OA (osteoarthritis) of knee   . Obesity   . Osteoarthritis   . Osteomyelitis ([place])   . Peripheral neuropathy   . Peripheral vascular disease ([place])   . Tobacco abuse     PAST SURGICAL HISTOIRY:    Past Surgical History:  Procedure Laterality Date  . ABDOMINAL AORTOGRAM W/LOWER EXTREMITY N/A 10/27/2016   Procedure: Abdominal Aortogram w/Lower Extremity;  Surgeon: Nelva Bush, MD;  Location: [facility];  Service: Cardiovascular;  Laterality: N/A;  . AMPUTATION TOE Left 10/21/2015   Procedure: AMPUTATION TOE;  Surgeon: Sharlotte Alamo, DPM;  Location: [facility];  Service: Podiatry;  Laterality: Left;  . AMPUTATION TOE Left 05/15/2017   Procedure: AMPUTATION LEFT GREAT TOE;  Surgeon: Sharlotte Alamo, DPM;  Location: [facility];  Service: Podiatry;  Laterality: Left;  . AORTIC VALVE REPLACEMENT (AVR)/CORONARY ARTERY BYPASS GRAFTING (CABG)    . BREAST BIOPSY    . CARDIAC CATHETERIZATION  06/2014   95% stenosis mLAD, occlusion ostial OM1, 70% stenosis LCx, 95% stenosis mRCA, EF 45%.  . CORONARY ARTERY BYPASS GRAFT N/A 06/08/2014   Procedure: CORONARY ARTERY BYPASS GRAFTING (CABG);  Surgeon: Grace Isaac, MD;  Location: [facility];  Service: Open Heart Surgery;  Laterality: N/A;  Times 4 using left internal mammary artery to LAD artery and endoscopically harvested bilateral saphenous vein to Obtuse Marginal, Diagonal and Posterior Descending coronary arteries.  . CT ABD W & PELVIS WO CM  06/2014   nl liver, gallbladder, spleen, mild diverticular changes, no bowel wall inflammation, appendix nl, no hernia, no other sig abnormalities  . LOWER EXTREMITY ANGIOGRAPHY Left 05/23/2017   Procedure: LOWER EXTREMITY ANGIOGRAPHY;  Surgeon: Algernon Huxley, MD;  Location: [facility];  Service: Cardiovascular;  Laterality: Left;  . LOWER EXTREMITY ANGIOGRAPHY Left  05/28/2017   Procedure: LOWER EXTREMITY ANGIOGRAPHY;  Surgeon: Algernon Huxley, MD;  Location: [facility];  Service: Cardiovascular;  Laterality: Left;  . TEE WITHOUT CARDIOVERSION N/A 06/08/2014   Procedure: TRANSESOPHAGEAL ECHOCARDIOGRAM (TEE);  Surgeon: Grace Isaac, MD;  Location: [facility];  Service: Open Heart Surgery;  Laterality:  N/A;  . TOE AMPUTATION Right 12/2011   rt middle toe  . US ECHOCARDIOGRAPHY  06/2014   EF 50-55%, HK of inf/post/inferolat walls, Ao sclerosis    SOCIAL HISTORY:   Social History   Tobacco Use  . Smoking status: Current Some Day Smoker    Packs/day: 0.00    Years: 40.00    Pack years: 0.00    Types: Cigarettes  . Smokeless tobacco: Never Used  Substance Use Topics  . Alcohol use: No    Alcohol/week: 0.0 standard drinks    FAMILY HISTORY:   Family History  Problem Relation Age of Onset  . CAD Mother   . Hypertension Mother   . Diabetes Mother   . CAD Father   . Diabetes Father   . Sickle cell trait Other   . Asthma Other     DRUG ALLERGIES:  No Known Allergies  REVIEW OF SYSTEMS:   Review of Systems  Constitutional: Negative for fever and weight loss.  HENT: Negative for congestion, nosebleeds and tinnitus.   Eyes: Negative for blurred vision, double vision and redness.  Respiratory: Negative for cough, hemoptysis and shortness of breath.   Cardiovascular: Negative for chest pain, orthopnea, leg swelling and PND.  Gastrointestinal: Negative for abdominal pain, diarrhea, melena, nausea and vomiting.  Genitourinary: Negative for dysuria, hematuria and urgency.  Musculoskeletal: Negative for falls and joint pain.  Neurological: Positive for dizziness. Negative for tingling, sensory change, focal weakness, seizures, weakness and headaches.  Endo/Heme/Allergies: Negative for polydipsia. Does not bruise/bleed easily.  Psychiatric/Behavioral: Negative for depression and memory loss. The patient is not nervous/anxious.      MEDICATIONS AT HOME:   Prior to Admission medications   Medication Sig Start Date End Date Taking? Authorizing Provider  aspirin 81 MG EC tablet Take 1 tablet (81 mg total) by mouth daily. 05/17/17   Loletha Grayer, MD  atorvastatin (LIPITOR) 80 MG tablet Take 1 tablet (80 mg total) by mouth daily at 6 PM. 05/11/17   Gollan, Kathlene November, MD   clopidogrel (PLAVIX) 75 MG tablet Take 1 tablet (75 mg total) by mouth daily. 05/17/17   Loletha Grayer, MD  escitalopram (LEXAPRO) 10 MG tablet Take 10 mg by mouth daily. 12/07/17   [provider]  furosemide (LASIX) 20 MG tablet Take 1 tablet (20 mg total) by mouth daily as needed. Patient taking differently: Take 20 mg by mouth daily as needed for fluid.  05/11/17 05/11/18  Minna Merritts, MD  gabapentin (NEURONTIN) 400 MG capsule Take 1,200 mg by mouth 3 (three) times daily.    [provider]  insulin aspart (NOVOLOG) 100 UNIT/ML injection Inject 3 Units into the skin 3 (three) times daily with meals. 05/17/17   Loletha Grayer, MD  insulin glargine (LANTUS) 100 UNIT/ML injection Inject 0.4 mLs (40 Units total) into the skin 2 (two) times daily. Patient taking differently: Inject 60 Units into the skin 2 (two) times daily.  05/17/17   Loletha Grayer, MD  lisinopril (PRINIVIL,ZESTRIL) 40 MG tablet Take 1 tablet (40 mg total) by mouth daily. 05/11/17   Minna Merritts, MD  metoprolol tartrate (LOPRESSOR) 25 MG tablet Take 1 tablet (25  mg total) by mouth 2 (two) times daily. 05/11/17   Minna Merritts, MD  naproxen (NAPROSYN) 500 MG tablet Take 500 mg by mouth 2 (two) times daily. 02/13/18   [provider]  oxybutynin (DITROPAN) 5 MG tablet Take 5 mg by mouth 2 (two) times daily.  05/10/17   [provider]  potassium chloride SA (K-DUR,KLOR-CON) 20 MEQ tablet Take 1 tablet (20 mEq total) by mouth daily as needed. Patient taking differently: Take 20 mEq by mouth daily as needed (with Lasix).  05/11/17 05/11/18  Minna Merritts, MD  varenicline (CHANTIX CONTINUING MONTH PAK) 1 MG tablet Take 1 tablet (1 mg total) by mouth 2 (two) times daily. 03/12/18   Minna Merritts, MD  varenicline (CHANTIX STARTING MONTH PAK) 0.5 MG X 11 & 1 MG X 42 tablet Take one 0.5 mg tablet by mouth once daily for 3 days,then take one 0.5 mg tablet twice daily for 4 days,then take  1 mg tablet twice daily. 12/14/17   Minna Merritts, MD      VITAL SIGNS:  Blood pressure 116/60, pulse 79, temperature 98.5 F (36.9 C), temperature source Oral, resp. rate 17, height 5\' 8"  (1.727 m), weight 115.7 kg, SpO2 100 %.  PHYSICAL EXAMINATION:  GENERAL:  62 y.o.-year-old obese patient sitting up in  no acute distress.  EYES: Pupils equal, round, reactive to light and accommodation. No scleral icterus. Extraocular muscles intact.  HEENT: Head atraumatic, normocephalic. Oropharynx and nasopharynx clear.  NECK:  Supple, no jugular venous distention. No thyroid enlargement, no tenderness.  LUNGS: Normal breath sounds bilaterally, no wheezing, rales, rhonchi . No use of accessory muscles of respiration.  CARDIOVASCULAR: S1, S2, RRR. No murmurs, rubs, gallops, clicks.  ABDOMEN: Soft, nontender, nondistended. Bowel sounds present. No organomegaly or mass.  EXTREMITIES: No pedal edema, cyanosis, or clubbing.  NEUROLOGIC: Cranial nerves II through XII are intact. No focal motor or sensory deficits appreciated bilaterally. Globally weak.   PSYCHIATRIC: The patient is alert and oriented x 3.  SKIN: No obvious rash, lesion, or ulcer.   LABORATORY PANEL:   CBC Recent Labs  Lab 03/12/18 1616  WBC 11.5*  HGB 9.1*  HCT 30.0*  PLT 380   ------------------------------------------------------------------------------------------------------------------  Chemistries  Recent Labs  Lab 03/12/18 1616 03/14/18 0707  NA 140 143  K 5.1 4.5  CL 106 111  CO2 27 26  GLUCOSE 133* 122*  BUN 39* 32*  CREATININE 1.77* 1.38*  CALCIUM 8.7* 8.4*  AST 13*  --   ALT 13  --   ALKPHOS 89  --   BILITOT 0.5  --    ------------------------------------------------------------------------------------------------------------------  Cardiac Enzymes No results for input(s): TROPONINI in the last 168  hours. ------------------------------------------------------------------------------------------------------------------  RADIOLOGY:  No results found.   IMPRESSION AND PLAN:   62 year old female with past medical history of peripheral vascular disease, peripheral neuropathy, diabetes, hypertension, history of coronary disease with previous MI, diabetic neuropathy, anxiety, tobacco abuse, peripheral vascular disease who presented to the hospital due to depression with suicidal ideations and noted to have some hypotension.  1.  Hypotension- patient had some relative hypotension although was not quite symptomatic with it. - She has a history of ischemic cardiomyopathy with EF of 40 to 45% as of an echo in 2016. - I will change her Lasix to as needed, lower her lisinopril dose and also lower her metoprolol dose and follow blood pressure. - If continues to be hypotensive would consider repeating echocardiogram to assess her ejection fraction  and stop all all her medications if needed  2.  Depression with suicidal ideations-continue further care as per psychiatry. -Continue Prozac.  3.  History of ischemic cardiomyopathy-clinically patient is not in congestive heart failure.  Continue Lasix as needed, patient has no lower extremity edema.  Continue low-dose metoprolol, lisinopril and adjust as needed given her hypotension.  4.  Hyperlipidemia-continue atorvastatin.  5.  Diabetes type 2 with peripheral neuropathy-continue Lantus, NovoLog with meals -Continue carb controlled diet, follow blood sugars.  6.  Diabetic neuropathy-continue gabapentin.  Thanks for the consultation will follow along with you.  All the records are reviewed and case discussed with Consulting provider. Management plans discussed with the patient, family and they are in agreement.  CODE STATUS: Full code  TOTAL TIME TAKING CARE OF THIS PATIENT: 45 minutes.    Henreitta Leber M.D on 03/16/2018 at 1:19  PM  Between 7am to 6pm - Pager - [phone]  After 6pm go to [email or web address] - password [email or web address] [facility]  Office  [phone]  CC: Primary care Physician: Donnie Coffin, MD

## 2018-03-17 LAB — GLUCOSE, CAPILLARY
Glucose-Capillary: 124 mg/dL — ABNORMAL HIGH (ref 70–99)
Glucose-Capillary: 222 mg/dL — ABNORMAL HIGH (ref 70–99)
Glucose-Capillary: 150 mg/dL — ABNORMAL HIGH (ref 70–99)
Glucose-Capillary: 177 mg/dL — ABNORMAL HIGH (ref 70–99)

## 2018-03-17 NOTE — Progress Notes (Signed)
D: Pt denies SI/HI/AVH. Pt is pleasant and cooperative. Pt stated she felt better this evening. Pt has been visible on the unit this evening. Pt appeared to be in brighter spirits.   A: Pt was offered support and encouragement. Pt was given scheduled medications. Pt was encourage to attend groups. Q 15 minute checks were done for safety.   R:Pt attends groups and interacts well with peers and staff. Pt is taking medication. Pt has no complaints.Pt receptive to treatment and safety maintained on unit.   Problem: Coping: Goal: Coping ability will improve Outcome: Progressing   Problem: Coping: Goal: Will verbalize feelings Outcome: Progressing   Problem: Health Behavior/Discharge Planning: Goal: Compliance with therapeutic regimen will improve Outcome: Progressing   Problem: Role Relationship: Goal: Will demonstrate positive changes in social behaviors and relationships Outcome: Progressing   Problem: Safety: Goal: Ability to disclose and discuss suicidal ideas will improve Outcome: Progressing

## 2018-03-17 NOTE — [facility] Group Notes (Signed)
LCSW Group Therapy Note 03/17/2018 1:15pm  Type of Therapy and Topic: Group Therapy: Feelings Around Returning Home & Establishing a Supportive Framework and Supporting Oneself When Supports Not Available  Participation Level: Active  Description of Group:  Patients first processed thoughts and feelings about upcoming discharge. These included fears of upcoming changes, lack of change, new living environments, judgements and expectations from others and overall stigma of mental health issues. The group then discussed the definition of a supportive framework, what that looks and feels like, and how do to discern it from an unhealthy non-supportive network. The group identified different types of supports as well as what to do when your family/friends are less than helpful or unavailable  Therapeutic Goals  1. Patient will identify one healthy supportive network that they can use at discharge. 2. Patient will identify one factor of a supportive framework and how to tell it from an unhealthy network. 3. Patient able to identify one coping skill to use when they do not have positive supports from others. 4. Patient will demonstrate ability to communicate their needs through discussion and/or role plays.  Summary of Patient Progress:  Pt reported she feels "okay." Pt engaged during group session. As patients processed their anxiety about discharge and described healthy supports patient shared she is ready to be discharge and start rehab.  Patients identified at least one self-care tool they were willing to use after discharge.   Therapeutic Modalities Cognitive Behavioral Therapy Motivational Interviewing   Shellby Schlink  CUEBAS-COLON, LCSW 03/17/2018 12:26 PM

## 2018-03-17 NOTE — Progress Notes (Signed)
[facility] MD Progress Note  03/17/2018 12:40 PM Melinda [name]  MRN:  [id number] Subjective: Patient is a 62 year old woman with a long history of multiple medical problems including diabetes, hyper tension, peripheral neuropathy, ischemic cardiomyopathy and the use of drugs and alcohol to the emergency room complaining of a fall but then reported she was very depressed and that she was abusing cocaine.   Pt seen and chart reviewed. She complains about "too much food here" and is worried about her BG, which is slightly high, and we agreed to change to diabetic diet instead of regular.   She slept well, but woke up at 3 am. She was worried about "did not know that our staff came to take her BP!"   She also requested to have her ear wax cleaned, and is explained that is not something we can do there.  She is instructed to see an outpatient ENT doctor.  Otherwise, she is doing well.   Otherwise, no SI or Hi, tolerates prozac  Principal Problem: Severe recurrent major depression without psychotic features ([place]) Diagnosis:   Patient Active Problem List   Diagnosis Date Noted  . Severe recurrent major depression without psychotic features ([place]) [F33.2] 03/12/2018  . Cocaine abuse ([place]) [F14.10] 03/12/2018  . Suicidal ideation [R45.851] 03/12/2018  . Atherosclerosis of native arteries of the extremities with ulceration ([place]) [I70.25] 06/19/2017  . Gangrene of toe of left foot ([place]) [I96] 05/14/2017  . CAD (coronary artery disease), native coronary artery [I25.10] 05/05/2017  . Claudication in peripheral vascular disease ([place]) [I73.9] 10/18/2016  . Hyperlipidemia LDL goal <70 [E78.5] 10/18/2016  . Onychomycosis [B35.1] 10/10/2016  . Medication monitoring encounter [Z51.81] 10/10/2016  . Amputated toe, left ([place]) [D32.671I] 10/10/2016  . Amputated toe, right ([place]) [W58.099I] 10/10/2016  . Osteomyelitis of left foot ([place]) [M86.9] 10/21/2015  . Hyperglycemia [R73.9] 10/02/2015  . IDDM (insulin  dependent diabetes mellitus) ([place]) [E11.9, Z79.4]   . Peripheral neuropathy [G62.9]   . Tobacco abuse [Z72.0]   . Obesity [E66.9]   . Ischemic cardiomyopathy [I25.5]   . 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 HTN (hypertension) [I10]   . DM2 (diabetes mellitus, type 2) ([place]) [E11.9]   . GERD (gastroesophageal reflux disease) [K21.9]   . NSTEMI (non-ST elevated myocardial infarction) ([place]) [I21.4] 06/03/2014   Total Time spent with patient: 20 minutes  Past Psychiatric History: Patient has been hospitalized once for psychiatric reasons and has been on multiple medications which she cannot remember.  Most recently she had been taking Lexapro.  Past Medical History:  Past Medical History:  Diagnosis Date  . Anxiety   . Coronary artery disease    a. NSTEMI 06/2014; b. 4 vessel CABG 06/08/2014 LIMA to LAD, SVG to Diagonal, SVG to OM, & SVG to PDA  . Diabetic neuropathy ([place])   . GERD (gastroesophageal reflux disease)   . Heart attack ([place])   . HTN (hypertension)   . IDDM (insulin dependent diabetes mellitus) ([place])   . Ischemic cardiomyopathy    a. echo 06/04/14 EF 40-45%, HK of entire inferolateral and inferior myocardium c.w infarct of RCA/LCx, GR2DD, mild MR  . OA (osteoarthritis) of knee   . Obesity   . Osteoarthritis   . Osteomyelitis ([place])   . Peripheral neuropathy   . Peripheral vascular disease ([place])   . Tobacco abuse     Past Surgical History:  Procedure Laterality Date  . ABDOMINAL AORTOGRAM W/LOWER EXTREMITY N/A 10/27/2016   Procedure: Abdominal Aortogram w/Lower Extremity;  Surgeon:  End, Harrell Gave, MD;  Location: [facility];  Service: Cardiovascular;  Laterality: N/A;  . AMPUTATION TOE Left 10/21/2015   Procedure: AMPUTATION TOE;  Surgeon: Sharlotte Alamo, DPM;  Location: [facility];  Service: Podiatry;  Laterality: Left;  . AMPUTATION TOE Left 05/15/2017   Procedure: AMPUTATION LEFT GREAT TOE;  Surgeon: Sharlotte Alamo, DPM;  Location: [facility];  Service: Podiatry;  Laterality:  Left;  . AORTIC VALVE REPLACEMENT (AVR)/CORONARY ARTERY BYPASS GRAFTING (CABG)    . BREAST BIOPSY    . CARDIAC CATHETERIZATION  06/2014   95% stenosis mLAD, occlusion ostial OM1, 70% stenosis LCx, 95% stenosis mRCA, EF 45%.  . CORONARY ARTERY BYPASS GRAFT N/A 06/08/2014   Procedure: CORONARY ARTERY BYPASS GRAFTING (CABG);  Surgeon: Grace Isaac, MD;  Location: [place];  Service: Open Heart Surgery;  Laterality: N/A;  Times 4 using left internal mammary artery to LAD artery and endoscopically harvested bilateral saphenous vein to Obtuse Marginal, Diagonal and Posterior Descending coronary arteries.  . CT ABD W & PELVIS WO CM  06/2014   nl liver, gallbladder, spleen, mild diverticular changes, no bowel wall inflammation, appendix nl, no hernia, no other sig abnormalities  . LOWER EXTREMITY ANGIOGRAPHY Left 05/23/2017   Procedure: LOWER EXTREMITY ANGIOGRAPHY;  Surgeon: Algernon Huxley, MD;  Location: [facility];  Service: Cardiovascular;  Laterality: Left;  . LOWER EXTREMITY ANGIOGRAPHY Left 05/28/2017   Procedure: LOWER EXTREMITY ANGIOGRAPHY;  Surgeon: Algernon Huxley, MD;  Location: [facility];  Service: Cardiovascular;  Laterality: Left;  . TEE WITHOUT CARDIOVERSION N/A 06/08/2014   Procedure: TRANSESOPHAGEAL ECHOCARDIOGRAM (TEE);  Surgeon: Grace Isaac, MD;  Location: [facility];  Service: Open Heart Surgery;  Laterality: N/A;  . TOE AMPUTATION Right 12/2011   rt middle toe  . US ECHOCARDIOGRAPHY  06/2014   EF 50-55%, HK of inf/post/inferolat walls, Ao sclerosis   Family History:  Family History  Problem Relation Age of Onset  . CAD Mother   . Hypertension Mother   . Diabetes Mother   . CAD Father   . Diabetes Father   . Sickle cell trait Other   . Asthma Other    Family Psychiatric  History: unknown Social History:  Social History   Substance and Sexual Activity  Alcohol Use No  . Alcohol/week: 0.0 standard drinks     Social History   Substance and Sexual Activity   Drug Use Yes   Comment: crack    Social History   Socioeconomic History  . Marital status: Widowed    Spouse name: Not on file  . Number of children: Not on file  . Years of education: Not on file  . Highest education level: Not on file  Occupational History  . Not on file  Social Needs  . Financial resource strain: Not on file  . Food insecurity:    Worry: Not on file    Inability: Not on file  . Transportation needs:    Medical: Not on file    Non-medical: Not on file  Tobacco Use  . Smoking status: Current Some Day Smoker    Packs/day: 0.00    Years: 40.00    Pack years: 0.00    Types: Cigarettes  . Smokeless tobacco: Never Used  Substance and Sexual Activity  . Alcohol use: No    Alcohol/week: 0.0 standard drinks  . Drug use: Yes    Comment: crack  . Sexual activity: Not on file  Lifestyle  . Physical activity:  Days per week: Not on file    Minutes per session: Not on file  . Stress: Not on file  Relationships  . Social connections:    Talks on phone: Not on file    Gets together: Not on file    Attends religious service: Not on file    Active member of club or organization: Not on file    Attends meetings of clubs or organizations: Not on file    Relationship status: Not on file  Other Topics Concern  . Not on file  Social History Narrative  . Not on file   Additional Social History:    Pain Medications: See MAR Prescriptions: See MAR Over the Counter: See MAR History of alcohol / drug use?: Yes Longest period of sobriety (when/how long): Unable to quantify Negative Consequences of Use: Financial, Personal relationships Name of Substance 1: Cocaine 1 - Age of First Use: Unable to quantify 1 - Amount (size/oz): Unable to quantify 1 - Frequency: Unable to quantify 1 - Duration: Unable to quantify 1 - Last Use / Amount: "a few days ago"  Sleep: Fair  Appetite:  Fair  Current Medications: Current Facility-Administered Medications   Medication Dose Route Frequency Provider Last Rate Last Dose  . acetaminophen (TYLENOL) tablet 650 mg  650 mg Oral Q6H PRN Clapacs, John T, MD   650 mg at 03/17/18 1110  . alum & mag hydroxide-simeth (MAALOX/MYLANTA) 200-200-20 MG/5ML suspension 30 mL  30 mL Oral Q4H PRN Clapacs, John T, MD      . aspirin EC tablet 81 mg  81 mg Oral Daily Clapacs, Madie Reno, MD   81 mg at 03/17/18 0831  . atorvastatin (LIPITOR) tablet 80 mg  80 mg Oral q1800 Clapacs, Madie Reno, MD   80 mg at 03/16/18 1744  . clopidogrel (PLAVIX) tablet 75 mg  75 mg Oral Daily Clapacs, Madie Reno, MD   75 mg at 03/17/18 0831  . collagenase (SANTYL) ointment   Topical Daily Ravi, Himabindu, MD      . FLUoxetine (PROZAC) capsule 10 mg  10 mg Oral Daily Ravi, Himabindu, MD   10 mg at 03/17/18 0831  . furosemide (LASIX) tablet 20 mg  20 mg Oral Daily PRN Henreitta Leber, MD      . gabapentin (NEURONTIN) capsule 400 mg  400 mg Oral TID Einar Grad, Himabindu, MD   400 mg at 03/17/18 1131  . insulin aspart (novoLOG) injection 3 Units  3 Units Subcutaneous TID WC Clapacs, Madie Reno, MD   3 Units at 03/17/18 1133  . insulin glargine (LANTUS) injection 60 Units  60 Units Subcutaneous BID Clapacs, Madie Reno, MD   60 Units at 03/17/18 [phone]  . lisinopril (PRINIVIL,ZESTRIL) tablet 5 mg  5 mg Oral Daily Henreitta Leber, MD   5 mg at 03/17/18 5701  . magnesium hydroxide (MILK OF MAGNESIA) suspension 30 mL  30 mL Oral Daily PRN Clapacs, Madie Reno, MD   30 mL at 03/15/18 1714  . metoprolol tartrate (LOPRESSOR) tablet 12.5 mg  12.5 mg Oral BID Henreitta Leber, MD   12.5 mg at 03/17/18 0826  . simethicone (MYLICON) chewable tablet 80 mg  80 mg Oral Q6H PRN James Lafalce, MD   80 mg at 03/16/18 1748  . traZODone (DESYREL) tablet 100 mg  100 mg Oral QHS PRN Clapacs, Madie Reno, MD   100 mg at 03/14/18 2202    Lab Results:  Results for orders placed or performed during the hospital encounter  of 03/13/18 (from the past 48 hour(s))  Glucose, capillary     Status: Abnormal    Collection Time: 03/15/18  4:18 PM  Result Value Ref Range   Glucose-Capillary 200 (H) 70 - 99 mg/dL   Comment 1 Notify RN   Glucose, capillary     Status: None   Collection Time: 03/15/18  8:51 PM  Result Value Ref Range   Glucose-Capillary 86 70 - 99 mg/dL   Comment 1 Notify RN   Glucose, capillary     Status: Abnormal   Collection Time: 03/16/18  7:11 AM  Result Value Ref Range   Glucose-Capillary 137 (H) 70 - 99 mg/dL   Comment 1 Notify RN   Glucose, capillary     Status: Abnormal   Collection Time: 03/16/18 11:25 AM  Result Value Ref Range   Glucose-Capillary 246 (H) 70 - 99 mg/dL  Glucose, capillary     Status: Abnormal   Collection Time: 03/16/18  4:24 PM  Result Value Ref Range   Glucose-Capillary 128 (H) 70 - 99 mg/dL  Glucose, capillary     Status: Abnormal   Collection Time: 03/16/18  8:56 PM  Result Value Ref Range   Glucose-Capillary 118 (H) 70 - 99 mg/dL   Comment 1 Notify RN   Glucose, capillary     Status: Abnormal   Collection Time: 03/17/18  7:08 AM  Result Value Ref Range   Glucose-Capillary 124 (H) 70 - 99 mg/dL   Comment 1 Notify RN   Glucose, capillary     Status: Abnormal   Collection Time: 03/17/18 11:35 AM  Result Value Ref Range   Glucose-Capillary 222 (H) 70 - 99 mg/dL    Blood Alcohol level:  No results found for: [facility]  Metabolic Disorder Labs: Lab Results  Component Value Date   HGBA1C 7.9 (H) 03/14/2018   MPG 180.03 03/14/2018   MPG 289.09 05/14/2017   No results found for: PROLACTIN Lab Results  Component Value Date   CHOL 92 03/14/2018   TRIG 71 03/14/2018   HDL 39 (L) 03/14/2018   CHOLHDL 2.4 03/14/2018   VLDL 14 03/14/2018   LDLCALC 39 03/14/2018   LDLCALC 103 (H) 10/10/2016    Physical Findings: AIMS:  , ,  ,  ,    CIWA:    COWS:     Musculoskeletal: Strength & Muscle Tone: abnormal Gait & Station: broad based Patient leans: Front  Psychiatric Specialty Exam: Physical Exam   ROS   Blood pressure (!) 137/58,  pulse 87, temperature 98.5 F (36.9 C), temperature source Oral, resp. rate 18, height 5\' 8"  (1.727 m), weight 115.7 kg, SpO2 100 %.Body mass index is 38.77 kg/m.  General Appearance: Casual and Fairly Groomed  Eye Contact:  Good  Speech:  Clear and Coherent and Normal Rate  Volume:  Increased  Mood:  Euthymic  Affect:  Appropriate and Congruent  Thought Process:  Coherent and Linear  Orientation:  Full (Time, Place, and Person)  Thought Content:  Logical  Suicidal Thoughts:  No  Homicidal Thoughts:  No  Memory:  Immediate;   Fair Recent;   Fair Remote;   Fair  Judgement:  Impaired  Insight:  Lacking  Psychomotor Activity:  Decreased  Concentration:  Concentration: Fair and Attention Span: Fair  Recall:  [employer] of Knowledge:  Fair  Language:  Fair  Akathisia:  No  Handed:  Right  AIMS (if indicated):     Assets:  Communication Skills Desire for Improvement  ADL's:  Intact  Cognition:  WNL  Sleep:  Number of Hours: 4.45     Treatment Plan Summary: Daily contact with patient to assess and evaluate symptoms and progress in treatment and Medication management   Major Depressive Disorder -Continue prozac at 10mg  po qd. -monitor for mood and safety -attend groups and develop coping skills  Suicidal ideations -develop action action alternatives for suicidal thoughts -develop coping skills to deal with negative thoughts.  Cocaine abuse -patient to consider substance abuse programs upon discharge  Diabetes; fairly controlled with the current regimen. -continue insulin lantus 60 units -insulin novolog 3 units tid with meals -change to diabetic diet.   Hypertension -- Hospitalist consulted as pt 's DBP was <60 for several occasions.  -- Per hospitalist recommendation:  -- reduced  To lisinopril 5 mg daily (was decreased from 40-20).  -- reduced Metoprolol to 12.5mg  BID from 25mg  BID -- STOP Lasix 20mg  daily.    Wound on Left foot -consult called - WOC  nurse following   Dizziness; could be from low BP - PT consult called , patient recommended to use walker and not be in the wheelchair all day  Diabetic Neuropathy -- continue  Neurontin to 400mg  po tid (patient insists she has been taking this dosage regularly and has not missed it in the last few weeks)  Hyperlipidemia -continue lipitor at 80mg  po qd  Coronary artery disease -continue plavix at 75mg  po qd - STOPPED lasix 20mg  po qd by hospitalist due to low BP.    Labs -lipid panel - hdl low, elevated LDL -hgA1c 7.9 -Blood glucose this am 140 -TSH normal -CMP shows decreased calcium, elevated BUN, creatinine  Discharge -safety and stability    Katelan Hirt, MD 03/17/2018, 12:40 PM

## 2018-03-17 NOTE — [facility] Group Notes (Signed)
[facility] Group Notes:  (Nursing/MHT/Case Management/Adjunct)  Date:  03/17/2018  Time:  10:02 PM  Type of Therapy:  Group Therapy  Participation Level:  Active  Participation Quality:  Appropriate  Affect:  Appropriate  Cognitive:  Appropriate  Insight:  Appropriate  Engagement in Group:  Engaged  Modes of Intervention:  Discussion  Summary of Progress/Problems: Melinda [name] stated she was making progress. [name] stated she was in a wheelchair when arriving on the unit. [name] stated her goal was to build the strength in her legs so she can get rid of the walker. MHT reviewed rules and expectations of the unit. MHT informed patients of visitation and phone hours. MHT informed patients of what was allowed on the unit. MHT encouraged patient not to share or trade snacks. MHT informed patient's snacks were not allowed in rooms. MHT informed patients that routine checks would be conducted throughout the night and to cover accordingly. MHT informed patients that room temperature could be adjusted as needed. MHT informed patient's not to use last names or share personal information. MHT informed patients of community resources. MHT provided supportive counseling to address making necessary changes in life. MHT encouraged patients to develop discharge plan to address needs in community.  Barnie Mort 03/17/2018, 10:02 PM

## 2018-03-17 NOTE — Plan of Care (Signed)
D: Pt denies SI/HI/AV hallucinations. Pt is pleasant and cooperative. Pt has been in milieu this morning. Patient states she hopes to go to a program for her drug usage when she is discharge.  A: Pt was offered support and encouragement. Pt was given scheduled medications. Pt was encourage to attend groups. Q 15 minute checks were done for safety.  R:Pt attends groups and interacts well with peers and staff. Pt is taking medication. Pt has no complaints.Pt receptive to treatment and safety maintained on unit.    Problem: Health Behavior/Discharge Planning: Goal: Compliance with therapeutic regimen will improve Outcome: Progressing   Problem: Safety: Goal: Ability to remain free from injury will improve Outcome: Progressing

## 2018-03-17 NOTE — Progress Notes (Signed)
Blood pressure reviewed and normal Will sign off. Please call with questions

## 2018-03-18 LAB — GLUCOSE, CAPILLARY
Glucose-Capillary: 89 mg/dL (ref 70–99)
Glucose-Capillary: 173 mg/dL — ABNORMAL HIGH (ref 70–99)
Glucose-Capillary: 111 mg/dL — ABNORMAL HIGH (ref 70–99)
Glucose-Capillary: 171 mg/dL — ABNORMAL HIGH (ref 70–99)

## 2018-03-18 NOTE — Plan of Care (Signed)
Patient remains safe on the unit. Patient stated to this writer that she wasn't having any suicidal ideation.   Problem: Safety: Goal: Ability to disclose and discuss suicidal ideas will improve Outcome: Progressing   Problem: Safety: Goal: Ability to remain free from injury will improve Outcome: Progressing

## 2018-03-18 NOTE — Tx Team (Addendum)
Interdisciplinary Treatment and Diagnostic Plan Update  03/18/2018 Time of Session: 1030am Sahira Cataldi MRN: [id number]  Principal Diagnosis: Severe recurrent major depression without psychotic features [facility])  Secondary Diagnoses: Principal Problem:   Severe recurrent major depression without psychotic features ([place])   Current Medications:  Current Facility-Administered Medications  Medication Dose Route Frequency Provider Last Rate Last Dose  . acetaminophen (TYLENOL) tablet 650 mg  650 mg Oral Q6H PRN Clapacs, Madie Reno, MD   650 mg at 03/18/18 0828  . alum & mag hydroxide-simeth (MAALOX/MYLANTA) 200-200-20 MG/5ML suspension 30 mL  30 mL Oral Q4H PRN Clapacs, John T, MD      . aspirin EC tablet 81 mg  81 mg Oral Daily Clapacs, Madie Reno, MD   81 mg at 03/18/18 0828  . atorvastatin (LIPITOR) tablet 80 mg  80 mg Oral q1800 Clapacs, Madie Reno, MD   80 mg at 03/17/18 1738  . clopidogrel (PLAVIX) tablet 75 mg  75 mg Oral Daily Clapacs, Madie Reno, MD   75 mg at 03/18/18 0828  . collagenase (SANTYL) ointment   Topical Daily Ravi, Himabindu, MD      . FLUoxetine (PROZAC) capsule 10 mg  10 mg Oral Daily Ravi, Himabindu, MD   10 mg at 03/18/18 5885  . furosemide (LASIX) tablet 20 mg  20 mg Oral Daily PRN Henreitta Leber, MD      . gabapentin (NEURONTIN) capsule 400 mg  400 mg Oral TID Einar Grad, Himabindu, MD   400 mg at 03/18/18 1153  . insulin aspart (novoLOG) injection 3 Units  3 Units Subcutaneous TID WC Clapacs, Madie Reno, MD   3 Units at 03/18/18 1153  . insulin glargine (LANTUS) injection 60 Units  60 Units Subcutaneous BID Clapacs, Madie Reno, MD   60 Units at 03/18/18 [phone]  . lisinopril (PRINIVIL,ZESTRIL) tablet 5 mg  5 mg Oral Daily Henreitta Leber, MD   5 mg at 03/18/18 0829  . magnesium hydroxide (MILK OF MAGNESIA) suspension 30 mL  30 mL Oral Daily PRN Clapacs, Madie Reno, MD   30 mL at 03/15/18 1714  . metoprolol tartrate (LOPRESSOR) tablet 12.5 mg  12.5 mg Oral BID Henreitta Leber, MD   12.5 mg at  03/18/18 0828  . simethicone (MYLICON) chewable tablet 80 mg  80 mg Oral Q6H PRN He, Jun, MD   80 mg at 03/17/18 2146  . traZODone (DESYREL) tablet 100 mg  100 mg Oral QHS PRN Clapacs, Madie Reno, MD   100 mg at 03/17/18 2145   PTA Medications: Medications Prior to Admission  Medication Sig Dispense Refill Last Dose  . aspirin 81 MG EC tablet Take 1 tablet (81 mg total) by mouth daily. 30 tablet 0 03/12/2018 at 0800  . atorvastatin (LIPITOR) 80 MG tablet Take 1 tablet (80 mg total) by mouth daily at 6 PM. 90 tablet 3 03/11/2018 at 2000  . clopidogrel (PLAVIX) 75 MG tablet Take 1 tablet (75 mg total) by mouth daily. 30 tablet 0 03/12/2018 at 0800  . escitalopram (LEXAPRO) 10 MG tablet Take 10 mg by mouth daily.   03/12/2018 at 0800  . furosemide (LASIX) 20 MG tablet Take 1 tablet (20 mg total) by mouth daily as needed. (Patient taking differently: Take 20 mg by mouth daily as needed for fluid. ) 90 tablet 3 Unknown at PRN  . gabapentin (NEURONTIN) 400 MG capsule Take 1,200 mg by mouth 3 (three) times daily.   03/12/2018 at 0800  . insulin aspart (NOVOLOG) 100  UNIT/ML injection Inject 3 Units into the skin 3 (three) times daily with meals. 10 mL 0 Taking  . insulin glargine (LANTUS) 100 UNIT/ML injection Inject 0.4 mLs (40 Units total) into the skin 2 (two) times daily. (Patient taking differently: Inject 60 Units into the skin 2 (two) times daily. ) 4 vial 1 03/11/2018 at Unknown time  . lisinopril (PRINIVIL,ZESTRIL) 40 MG tablet Take 1 tablet (40 mg total) by mouth daily. 90 tablet 3 03/12/2018 at 0800  . metoprolol tartrate (LOPRESSOR) 25 MG tablet Take 1 tablet (25 mg total) by mouth 2 (two) times daily. 180 tablet 3 03/12/2018 at 0800  . naproxen (NAPROSYN) 500 MG tablet Take 500 mg by mouth 2 (two) times daily.   03/12/2018 at 0800  . oxybutynin (DITROPAN) 5 MG tablet Take 5 mg by mouth 2 (two) times daily.    03/12/2018 at 0800  . potassium chloride SA (K-DUR,KLOR-CON) 20 MEQ tablet Take 1 tablet  (20 mEq total) by mouth daily as needed. (Patient taking differently: Take 20 mEq by mouth daily as needed (with Lasix). ) 90 tablet 3 Unknown at PRN  . varenicline (CHANTIX CONTINUING MONTH PAK) 1 MG tablet Take 1 tablet (1 mg total) by mouth 2 (two) times daily. 60 tablet 1 03/12/2018 at 0800  . varenicline (CHANTIX STARTING MONTH PAK) 0.5 MG X 11 & 1 MG X 42 tablet Take one 0.5 mg tablet by mouth once daily for 3 days,then take one 0.5 mg tablet twice daily for 4 days,then take 1 mg tablet twice daily. 53 tablet 0     Patient Stressors: Health problems Substance abuse Other: depression  Patient Strengths: Ability for insight Active sense of humor Average or above average intelligence Capable of independent living Communication skills General fund of knowledge Motivation for treatment/growth Special hobby/interest Supportive family/friends  Treatment Modalities: Medication Management, Group therapy, Case management,  1 to 1 session with clinician, Psychoeducation, Recreational therapy.   Physician Treatment Plan for Primary Diagnosis: Severe recurrent major depression without psychotic features ([place]) Long Term Goal(s): Improvement in symptoms so as ready for discharge Improvement in symptoms so as ready for discharge   Short Term Goals: Ability to identify changes in lifestyle to reduce recurrence of condition will improve Ability to verbalize feelings will improve Ability to disclose and discuss suicidal ideas Ability to demonstrate self-control will improve Ability to identify and develop effective coping behaviors will improve Ability to maintain clinical measurements within normal limits will improve Compliance with prescribed medications will improve Ability to identify triggers associated with substance abuse/mental health issues will improve Ability to identify changes in lifestyle to reduce recurrence of condition will improve Ability to verbalize feelings will  improve Ability to disclose and discuss suicidal ideas Ability to demonstrate self-control will improve Ability to identify and develop effective coping behaviors will improve Ability to maintain clinical measurements within normal limits will improve Compliance with prescribed medications will improve Ability to identify triggers associated with substance abuse/mental health issues will improve  Medication Management: Evaluate patient's response, side effects, and tolerance of medication regimen.  Therapeutic Interventions: 1 to 1 sessions, Unit Group sessions and Medication administration.  Evaluation of Outcomes: Progressing  Physician Treatment Plan for Secondary Diagnosis: Principal Problem:   Severe recurrent major depression without psychotic features ([place])  Long Term Goal(s): Improvement in symptoms so as ready for discharge Improvement in symptoms so as ready for discharge   Short Term Goals: Ability to identify changes in lifestyle to reduce recurrence of condition will improve  Ability to verbalize feelings will improve Ability to disclose and discuss suicidal ideas Ability to demonstrate self-control will improve Ability to identify and develop effective coping behaviors will improve Ability to maintain clinical measurements within normal limits will improve Compliance with prescribed medications will improve Ability to identify triggers associated with substance abuse/mental health issues will improve Ability to identify changes in lifestyle to reduce recurrence of condition will improve Ability to verbalize feelings will improve Ability to disclose and discuss suicidal ideas Ability to demonstrate self-control will improve Ability to identify and develop effective coping behaviors will improve Ability to maintain clinical measurements within normal limits will improve Compliance with prescribed medications will improve Ability to identify triggers associated with  substance abuse/mental health issues will improve     Medication Management: Evaluate patient's response, side effects, and tolerance of medication regimen.  Therapeutic Interventions: 1 to 1 sessions, Unit Group sessions and Medication administration.  Evaluation of Outcomes: Progressing   RN Treatment Plan for Primary Diagnosis: Severe recurrent major depression without psychotic features ([place]) Long Term Goal(s): Knowledge of disease and therapeutic regimen to maintain health will improve  Short Term Goals: Ability to verbalize feelings will improve, Ability to identify and develop effective coping behaviors will improve and Compliance with prescribed medications will improve  Medication Management: RN will administer medications as ordered by provider, will assess and evaluate patient's response and provide education to patient for prescribed medication. RN will report any adverse and/or side effects to prescribing provider.  Therapeutic Interventions: 1 on 1 counseling sessions, Psychoeducation, Medication administration, Evaluate responses to treatment, Monitor vital signs and CBGs as ordered, Perform/monitor CIWA, COWS, AIMS and Fall Risk screenings as ordered, Perform wound care treatments as ordered.  Evaluation of Outcomes: Progressing   LCSW Treatment Plan for Primary Diagnosis: Severe recurrent major depression without psychotic features ([place]) Long Term Goal(s): Safe transition to appropriate next level of care at discharge, Engage patient in therapeutic group addressing interpersonal concerns.  Short Term Goals: Engage patient in aftercare planning with referrals and resources, Increase ability to appropriately verbalize feelings, Increase emotional regulation, Facilitate acceptance of mental health diagnosis and concerns, Facilitate patient progression through stages of change regarding substance use diagnoses and concerns, Identify triggers associated with mental  health/substance abuse issues and Increase skills for wellness and recovery  Therapeutic Interventions: Assess for all discharge needs, 1 to 1 time with Social worker, Explore available resources and support systems, Assess for adequacy in community support network, Educate family and significant other(s) on suicide prevention, Complete Psychosocial Assessment, Interpersonal group therapy.  Evaluation of Outcomes: Progressing   Progress in Treatment: Attending groups: Yes. Participating in groups: Yes. Taking medication as prescribed: Yes. Toleration medication: Yes. Family/Significant other contact made: No, will contact:  Pt refused Patient understands diagnosis: Yes. Discussing patient identified problems/goals with staff: Yes. Medical problems stabilized or resolved: Yes. Denies suicidal/homicidal ideation: Yes. Issues/concerns per patient self-inventory: No. Other: NA  New problem(s) identified: No, Describe:  None reported  New Short Term/Long Term Goal(s):"Getting somewhere so I can do my substance abuse"  Patient Goals:  "Getting somewhere so I can do my substance abuse"  Discharge Plan or Barriers: Pt will return home and follow up with outpatient treatment  Reason for Continuation of Hospitalization: Medication stabilization  Estimated Length of Stay:5-7 days   Recreational Therapy: Patient Stressors: N/A Patient Goal: Patient will identify 3 resources in their community to aid in recovery within 5 recreation therapy group sessions Attendees: Patient:Atiyana Strader 03/18/2018 2:18 PM  Physician: Earnest Bailey  McNew, MD 03/18/2018 2:18 PM  Nursing: Eugenio Hoes, RN 03/18/2018 2:18 PM  RN Care Manager: 03/18/2018 2:18 PM  Social Worker: Sanjuana Kava, LCSW 03/18/2018 2:18 PM  Recreational Therapist:Shay. Marcello Fennel, LRT  03/18/2018 2:18 PM  Other: Eppie Gibson, Chaplain 03/18/2018 2:18 PM  Other:  03/18/2018 2:18 PM  Other: 03/18/2018 2:18 PM    Scribe for  Treatment Team: Yvette Rack, LCSW 03/18/2018 2:18 PM

## 2018-03-18 NOTE — Progress Notes (Signed)
Physical Therapy Treatment Patient Details Name: Melinda [name] MRN: [id number] DOB: 26-Nov-1955 Today's Date: 03/18/2018    History of Present Illness 62 y/o female in behavior health unit with depression, had fall 2 months ago (9/12), hit head and has been having ongoing dizziness and headache.      PT Comments    Pt agreeable to PT; reports chronic arthritic pain in BLEs, back and R hand. Pt ambulates with rolling walker with poor upright posture and Trendelenberg on R hip. Pt educated in seated and stand exercises with encouragement to focus on abdominal stabilization as well as stabilized strength on stabilizing side. Pt receptive to all education and demonstrates understanding. Continue PT to progress strength through improved knowledge base and performance of exercises to improve functional mobility safely.    Follow Up Recommendations  Home health PT     Equipment Recommendations       Recommendations for Other Services       Precautions / Restrictions Restrictions Weight Bearing Restrictions: No    Mobility  Bed Mobility               General bed mobility comments: Not tested  Transfers Overall transfer level: Modified independent Equipment used: Rolling walker (2 wheeled)             General transfer comment: Uses hands on rw at all times, but steady. Poor upright posture  Ambulation/Gait Ambulation/Gait assistance: Modified independent (Device/Increase time) Gait Distance (Feet): 150 Feet Assistive device: Rolling walker (2 wheeled) Gait Pattern/deviations: Trendelenburg;Trunk flexed(on R)         Stairs             Wheelchair Mobility    Modified Rankin (Stroke Patients Only)       Balance                                            Cognition Arousal/Alertness: Awake/alert Behavior During Therapy: WFL for tasks assessed/performed Overall Cognitive Status: Within Functional Limits for tasks assessed                                         Exercises General Exercises - Lower Extremity Long Arc Quad: AROM;Both;10 reps;Seated;Strengthening(2 sets with abdominal brace) Hip ABduction/ADduction: Strengthening;Both;10 reps;Standing;Other (comment)(2 sets) Straight Leg Raises: Strengthening;Both;10 reps;Standing(2 sets) Hip Flexion/Marching: AROM;Strengthening;Both;10 reps;Seated;Other (comment)(with abdominal brace) Heel Raises: Strengthening;Both;10 reps;Standing(2 sets; little clearance) Other Exercises Other Exercises: education on abdominal brace as exercise and with other exercises and function Other Exercises: Education on proper posture and stabilized strength with stand exercises    General Comments        Pertinent Vitals/Pain Pain Assessment: Faces Faces Pain Scale: Hurts even more Pain Location: B feet, LEs, back and digits 3,4,5 on R hand Pain Descriptors / Indicators: (arthritic) Pain Intervention(s): Monitored during session;Premedicated before session;Other (comment)(Taking ibuprofen or Tylenol)    Home Living                      Prior Function            PT Goals (current goals can now be found in the care plan section) Progress towards PT goals: Progressing toward goals    Frequency    Min 2X/week      PT Plan  Current plan remains appropriate    Co-evaluation              AM-PAC PT "6 Clicks" Daily Activity  Outcome Measure  Difficulty turning over in bed (including adjusting bedclothes, sheets and blankets)?: None Difficulty moving from lying on back to sitting on the side of the bed? : None Difficulty sitting down on and standing up from a chair with arms (e.g., wheelchair, bedside commode, etc,.)?: A Little Help needed moving to and from a bed to chair (including a wheelchair)?: None Help needed walking in hospital room?: None Help needed climbing 3-5 steps with a railing? : A Little 6 Click Score: 22    End of  Session   Activity Tolerance: Patient tolerated treatment well Patient left: Other (comment)(in w/c in room)         Time: 5993-5701 PT Time Calculation (min) (ACUTE ONLY): 30 min  Charges:  $Gait Training: 8-22 mins $Therapeutic Exercise: 8-22 mins                      Larae Grooms, PTA 03/18/2018, 4:31 PM

## 2018-03-18 NOTE — Progress Notes (Signed)
   03/18/18 1030  Clinical Encounter Type  Visited With Patient;Health care provider  Visit Type Initial;Spiritual support;Behavioral Health  Referral From Social work  Consult/Referral To Chaplain  Spiritual Encounters  Spiritual Needs Emotional;Other (Comment)   [place] attended the patient's treatment team meeting. Ms. Sheu feels that she is sleeping too much. She wants her medication for sleeping to be reduced and taken only if needed. The patient is also hoping for placement in a facility that will allow her to get help with substance abuse.

## 2018-03-18 NOTE — Progress Notes (Signed)
Recreation Therapy Notes  Date: 03/18/2018  Time: 9:30 am   Location: Craft room   Behavioral response: N/A   Intervention Topic: Coping skills  Discussion/Intervention: Patient did not attend group.   Clinical Observations/Feedback:  Patient did not attend group.   Shawnette Augello LRT/CTRS        Donley Harland 03/18/2018 12:03 PM

## 2018-03-18 NOTE — Plan of Care (Signed)
Patient stated that her depression is getting better but she need help with her substance abuse too.Appropriate with staff & peers.Compliant with medications.Denies SI,HI and AVH.Appetite and energy level good.Attended groups.Dressing changed.No drainage noted.Support and encouragement given.

## 2018-03-18 NOTE — Progress Notes (Signed)
D - Patient was in her room upon arrival to the unit. Patient was pleasant during assessment with this Probation officer. Patient denied SI/HI/AVH, anxiety, pain and depression. Patient stated that she had a good day. Patient was seen interacting appropriately with staff and peers. Patient was ambulatory on the unit with her walker with no issues. Patient verbally contracts for safety with this Probation officer. Patient asked for medication to help her sleep and for flatulence. Medications administered per MD orders.   A - Patient took medications per MD orders and followed unit procedures. Patient was given education. Patient was provided with encouragement and support. Patient informed to let staff know if there are any issues on the unit.   R - Patient was compliant with medication administration per MD orders. Patient monitored Q 15 minutes for safety per unit protocol. Patient remains safe on the unit.

## 2018-03-18 NOTE — Progress Notes (Signed)
[facility] MD Progress Note  03/18/2018 2:33 PM Melinda [name]  MRN:  [id number] Subjective:  Pt states taht she is feeling okay. Her mood is improving and feeling less depressed. She wants to go to [place] for help with addiction issues as this has taken a toll on her life and here health has been greatly declining because of it. She is sleeping slightly better. She denies SI or thoughts of self harm today. Seh has been reading the bible. She is eating better.   Principal Problem: Severe recurrent major depression without psychotic features ([place]) Diagnosis: Principal Problem:   Severe recurrent major depression without psychotic features ([place])  Total Time spent with patient: 20 minutes  Past Psychiatric History: See H&p  Past Medical History:  Past Medical History:  Diagnosis Date  . Anxiety   . Coronary artery disease    a. NSTEMI 06/2014; b. 4 vessel CABG 06/08/2014 LIMA to LAD, SVG to Diagonal, SVG to OM, & SVG to PDA  . Diabetic neuropathy ([place])   . GERD (gastroesophageal reflux disease)   . Heart attack ([place])   . HTN (hypertension)   . IDDM (insulin dependent diabetes mellitus) ([place])   . Ischemic cardiomyopathy    a. echo 06/04/14 EF 40-45%, HK of entire inferolateral and inferior myocardium c.w infarct of RCA/LCx, GR2DD, mild MR  . OA (osteoarthritis) of knee   . Obesity   . Osteoarthritis   . Osteomyelitis ([place])   . Peripheral neuropathy   . Peripheral vascular disease ([place])   . Tobacco abuse     Past Surgical History:  Procedure Laterality Date  . ABDOMINAL AORTOGRAM W/LOWER EXTREMITY N/A 10/27/2016   Procedure: Abdominal Aortogram w/Lower Extremity;  Surgeon: Nelva Bush, MD;  Location: [place] [facility];  Service: Cardiovascular;  Laterality: N/A;  . AMPUTATION TOE Left 10/21/2015   Procedure: AMPUTATION TOE;  Surgeon: Sharlotte Alamo, DPM;  Location: [facility];  Service: Podiatry;  Laterality: Left;  . AMPUTATION TOE Left 05/15/2017   Procedure: AMPUTATION LEFT GREAT TOE;   Surgeon: Sharlotte Alamo, DPM;  Location: [facility];  Service: Podiatry;  Laterality: Left;  . AORTIC VALVE REPLACEMENT (AVR)/CORONARY ARTERY BYPASS GRAFTING (CABG)    . BREAST BIOPSY    . CARDIAC CATHETERIZATION  06/2014   95% stenosis mLAD, occlusion ostial OM1, 70% stenosis LCx, 95% stenosis mRCA, EF 45%.  . CORONARY ARTERY BYPASS GRAFT N/A 06/08/2014   Procedure: CORONARY ARTERY BYPASS GRAFTING (CABG);  Surgeon: Grace Isaac, MD;  Location: [place];  Service: Open Heart Surgery;  Laterality: N/A;  Times 4 using left internal mammary artery to LAD artery and endoscopically harvested bilateral saphenous vein to Obtuse Marginal, Diagonal and Posterior Descending coronary arteries.  . CT ABD W & PELVIS WO CM  06/2014   nl liver, gallbladder, spleen, mild diverticular changes, no bowel wall inflammation, appendix nl, no hernia, no other sig abnormalities  . LOWER EXTREMITY ANGIOGRAPHY Left 05/23/2017   Procedure: LOWER EXTREMITY ANGIOGRAPHY;  Surgeon: Algernon Huxley, MD;  Location: [place] [facility];  Service: Cardiovascular;  Laterality: Left;  . LOWER EXTREMITY ANGIOGRAPHY Left 05/28/2017   Procedure: LOWER EXTREMITY ANGIOGRAPHY;  Surgeon: Algernon Huxley, MD;  Location: [place] [facility];  Service: Cardiovascular;  Laterality: Left;  . TEE WITHOUT CARDIOVERSION N/A 06/08/2014   Procedure: TRANSESOPHAGEAL ECHOCARDIOGRAM (TEE);  Surgeon: Grace Isaac, MD;  Location: [place];  Service: Open Heart Surgery;  Laterality: N/A;  . TOE AMPUTATION Right 12/2011   rt middle toe  .  US ECHOCARDIOGRAPHY  06/2014   EF 50-55%, HK of inf/post/inferolat walls, Ao sclerosis   Family History:  Family History  Problem Relation Age of Onset  . CAD Mother   . Hypertension Mother   . Diabetes Mother   . CAD Father   . Diabetes Father   . Sickle cell trait Other   . Asthma Other    Family Psychiatric  History: See H&P Social History:  Social History   Substance and Sexual Activity  Alcohol Use No  .  Alcohol/week: 0.0 standard drinks     Social History   Substance and Sexual Activity  Drug Use Yes   Comment: crack    Social History   Socioeconomic History  . Marital status: Widowed    Spouse name: Not on file  . Number of children: Not on file  . Years of education: Not on file  . Highest education level: Not on file  Occupational History  . Not on file  Social Needs  . Financial resource strain: Not on file  . Food insecurity:    Worry: Not on file    Inability: Not on file  . Transportation needs:    Medical: Not on file    Non-medical: Not on file  Tobacco Use  . Smoking status: Current Some Day Smoker    Packs/day: 0.00    Years: 40.00    Pack years: 0.00    Types: Cigarettes  . Smokeless tobacco: Never Used  Substance and Sexual Activity  . Alcohol use: No    Alcohol/week: 0.0 standard drinks  . Drug use: Yes    Comment: crack  . Sexual activity: Not on file  Lifestyle  . Physical activity:    Days per week: Not on file    Minutes per session: Not on file  . Stress: Not on file  Relationships  . Social connections:    Talks on phone: Not on file    Gets together: Not on file    Attends religious service: Not on file    Active member of club or organization: Not on file    Attends meetings of clubs or organizations: Not on file    Relationship status: Not on file  Other Topics Concern  . Not on file  Social History Narrative  . Not on file   Additional Social History:    Pain Medications: See MAR Prescriptions: See MAR Over the Counter: See MAR History of alcohol / drug use?: Yes Longest period of sobriety (when/how long): Unable to quantify Negative Consequences of Use: Financial, Personal relationships Name of Substance 1: Cocaine 1 - Age of First Use: Unable to quantify 1 - Amount (size/oz): Unable to quantify 1 - Frequency: Unable to quantify 1 - Duration: Unable to quantify 1 - Last Use / Amount: "a few days ago"                   Sleep: Fair  Appetite:  Fair  Current Medications: Current Facility-Administered Medications  Medication Dose Route Frequency Provider Last Rate Last Dose  . acetaminophen (TYLENOL) tablet 650 mg  650 mg Oral Q6H PRN Clapacs, Madie Reno, MD   650 mg at 03/18/18 0828  . alum & mag hydroxide-simeth (MAALOX/MYLANTA) 200-200-20 MG/5ML suspension 30 mL  30 mL Oral Q4H PRN Clapacs, John T, MD      . aspirin EC tablet 81 mg  81 mg Oral Daily Clapacs, Madie Reno, MD   81 mg at 03/18/18 0828  .  atorvastatin (LIPITOR) tablet 80 mg  80 mg Oral q1800 Clapacs, Madie Reno, MD   80 mg at 03/17/18 1738  . clopidogrel (PLAVIX) tablet 75 mg  75 mg Oral Daily Clapacs, Madie Reno, MD   75 mg at 03/18/18 0828  . collagenase (SANTYL) ointment   Topical Daily Ravi, Himabindu, MD      . FLUoxetine (PROZAC) capsule 10 mg  10 mg Oral Daily Ravi, Himabindu, MD   10 mg at 03/18/18 1962  . furosemide (LASIX) tablet 20 mg  20 mg Oral Daily PRN Henreitta Leber, MD      . gabapentin (NEURONTIN) capsule 400 mg  400 mg Oral TID Einar Grad, Himabindu, MD   400 mg at 03/18/18 1153  . insulin aspart (novoLOG) injection 3 Units  3 Units Subcutaneous TID WC Clapacs, Madie Reno, MD   3 Units at 03/18/18 1153  . insulin glargine (LANTUS) injection 60 Units  60 Units Subcutaneous BID Clapacs, Madie Reno, MD   60 Units at 03/18/18 [phone]  . lisinopril (PRINIVIL,ZESTRIL) tablet 5 mg  5 mg Oral Daily Henreitta Leber, MD   5 mg at 03/18/18 0829  . magnesium hydroxide (MILK OF MAGNESIA) suspension 30 mL  30 mL Oral Daily PRN Clapacs, Madie Reno, MD   30 mL at 03/15/18 1714  . metoprolol tartrate (LOPRESSOR) tablet 12.5 mg  12.5 mg Oral BID Henreitta Leber, MD   12.5 mg at 03/18/18 0828  . simethicone (MYLICON) chewable tablet 80 mg  80 mg Oral Q6H PRN He, Jun, MD   80 mg at 03/17/18 2146  . traZODone (DESYREL) tablet 100 mg  100 mg Oral QHS PRN Clapacs, Madie Reno, MD   100 mg at 03/17/18 2145    Lab Results:  Results for orders placed or performed during the  hospital encounter of 03/13/18 (from the past 48 hour(s))  Glucose, capillary     Status: Abnormal   Collection Time: 03/16/18  4:24 PM  Result Value Ref Range   Glucose-Capillary 128 (H) 70 - 99 mg/dL  Glucose, capillary     Status: Abnormal   Collection Time: 03/16/18  8:56 PM  Result Value Ref Range   Glucose-Capillary 118 (H) 70 - 99 mg/dL   Comment 1 Notify RN   Glucose, capillary     Status: Abnormal   Collection Time: 03/17/18  7:08 AM  Result Value Ref Range   Glucose-Capillary 124 (H) 70 - 99 mg/dL   Comment 1 Notify RN   Glucose, capillary     Status: Abnormal   Collection Time: 03/17/18 11:35 AM  Result Value Ref Range   Glucose-Capillary 222 (H) 70 - 99 mg/dL  Glucose, capillary     Status: Abnormal   Collection Time: 03/17/18  4:29 PM  Result Value Ref Range   Glucose-Capillary 150 (H) 70 - 99 mg/dL  Glucose, capillary     Status: Abnormal   Collection Time: 03/17/18  8:38 PM  Result Value Ref Range   Glucose-Capillary 177 (H) 70 - 99 mg/dL   Comment 1 Notify RN   Glucose, capillary     Status: None   Collection Time: 03/18/18  6:52 AM  Result Value Ref Range   Glucose-Capillary 89 70 - 99 mg/dL   Comment 1 Notify RN   Glucose, capillary     Status: Abnormal   Collection Time: 03/18/18 11:50 AM  Result Value Ref Range   Glucose-Capillary 173 (H) 70 - 99 mg/dL    Blood Alcohol level:  No results found for: [facility]  Metabolic Disorder Labs: Lab Results  Component Value Date   HGBA1C 7.9 (H) 03/14/2018   MPG 180.03 03/14/2018   MPG 289.09 05/14/2017   No results found for: PROLACTIN Lab Results  Component Value Date   CHOL 92 03/14/2018   TRIG 71 03/14/2018   HDL 39 (L) 03/14/2018   CHOLHDL 2.4 03/14/2018   VLDL 14 03/14/2018   LDLCALC 39 03/14/2018   LDLCALC 103 (H) 10/10/2016    Physical Findings: AIMS:  , ,  ,  ,    CIWA:    COWS:     Musculoskeletal: Strength & Muscle Tone: within normal limits Gait & Station: unsteady Patient leans:  N/A  Psychiatric Specialty Exam: Physical Exam  Nursing note and vitals reviewed.   Review of Systems  All other systems reviewed and are negative.   Blood pressure (!) 128/56, pulse 86, temperature 97.8 F (36.6 C), resp. rate 18, height 5\' 8"  (1.727 m), weight 115.7 kg, SpO2 100 %.Body mass index is 38.77 kg/m.  General Appearance: Casual  Eye Contact:  Fair  Speech:  Clear and Coherent  Volume:  Normal  Mood:  Depressed  Affect:  Appropriate  Thought Process:  Coherent and Goal Directed  Orientation:  Full (Time, Place, and Person)  Thought Content:  Logical  Suicidal Thoughts:  No  Homicidal Thoughts:  No  Memory:  Immediate;   Fair  Judgement:  Fair  Insight:  Fair  Psychomotor Activity:  Normal  Concentration:  Concentration: Fair  Recall:  [employer] of Knowledge:  Fair  Language:  Fair  Akathisia:  No      Assets:  Resilience  ADL's:  Intact  Cognition:  WNL  Sleep:  Number of Hours: 6.45     Treatment Plan Summary: 62 yo female admitted due to SI and crack cocaine use. Mood is improving. Seh has long history of drug use and health has declined significantly since using drugs. She would greatly benefit from residential treatment which she is now willing to be referred to.  Plan:  Mood -continue Prozac 10 mg daily  Cocaine use disorder -[facility] referral  Diabetes _care coordinator on board -Continue Lantus 60units BID -Continue Noclog 3 units TID  Neuropathy -Gabapentin 400 mg TID  HTN -Lisinopril 5 mg daily -Metoprolol 12.5 mg BID  CAD -Plavix and Lasix prn  Disp -[place] R Jalyn Rosero, MD 03/18/2018, 2:33 PM

## 2018-03-19 LAB — GLUCOSE, CAPILLARY
Glucose-Capillary: 94 mg/dL (ref 70–99)
Glucose-Capillary: 135 mg/dL — ABNORMAL HIGH (ref 70–99)
Glucose-Capillary: 140 mg/dL — ABNORMAL HIGH (ref 70–99)
Glucose-Capillary: 104 mg/dL — ABNORMAL HIGH (ref 70–99)

## 2018-03-19 NOTE — [facility] Group Notes (Signed)
[facility] Group Notes:  (Nursing/MHT/Case Management/Adjunct)  Date:  03/19/2018  Time:  9:23 PM  Type of Therapy:  Group Therapy  Participation Level:  Active  Participation Quality:  Appropriate  Affect:  Appropriate  Cognitive:  Appropriate  Insight:  Appropriate  Engagement in Group:  Engaged  Modes of Intervention:  Discussion  Summary of Progress/Problems: Thera stated she wanted to get away from her walker. Lyric stated she had a cyst on her foot that was taking a long time to heal. Lavelle stated she was doing good walking around with her walker. MHT reviewed rules and expectations of the unit. MHT informed patients of visiting hours and limit of visitors at one time. MHT informed patient's visitors were not allowed to bring in food and up to 5 sets of clothing. MHT encouraged patients not to give out private information and not to use last name on unit. MHT encouraged patients to attend groups throughout the day. MHT informed patients of phone hours and the limit of long-distance phone calls. MHT informed patients they would have a discharge plan with follow up directions. MHT encouraged patients to follow up with recommended treatment. MHT processed with patients about the importance of taking medications as prescribed.  Barnie Mort 03/19/2018, 9:23 PM

## 2018-03-19 NOTE — Progress Notes (Signed)
D: Patient visible in the milieu this morning.  She took her insulin dosage.  She is pleasant with staff.  She was focused on her dressing change this morning; it was changed at 1000.  She rates her depression and hopelessness as a 5; anxiety as a 4.  She complained of a headache was received tylenol with good results.  She denies any thoughts of self harm.  Patient continues to ambulate with a wheelchair.  She has also been observed ambulating with a rolling walker.   A: Continue to monitor medication management and MD orders.  Safety checks completed every 15 minutes per protocol.  Offer support and encouragement as needed.  R: Patient is receptive to staff; her behavior is appropriate.

## 2018-03-19 NOTE — Progress Notes (Signed)
[facility] MD Progress Note  03/19/2018 2:26 PM Melinda [name]  MRN:  [id number] Subjective:  Pt states that she is feeling okay. Her mood is improving. She is attending groups and participating well. She feels groups are very helpful. She did not sleep well last night because she felt sick after eating too many snacks. She did not endorse SI or any thoughts of self harm. She is still extremely motivated to get into residential treatment and feels that she is at high risk to relapse if she does not discharge directly to treatment. She states, "I don't have the tools yet."   Principal Problem: Severe recurrent major depression without psychotic features ([place]) Diagnosis: Principal Problem:   Severe recurrent major depression without psychotic features ([place])  Total Time spent with patient: 20 minutes  Past Psychiatric History: See H&P  Past Medical History:  Past Medical History:  Diagnosis Date  . Anxiety   . Coronary artery disease    a. NSTEMI 06/2014; b. 4 vessel CABG 06/08/2014 LIMA to LAD, SVG to Diagonal, SVG to OM, & SVG to PDA  . Diabetic neuropathy ([place])   . GERD (gastroesophageal reflux disease)   . Heart attack ([place])   . HTN (hypertension)   . IDDM (insulin dependent diabetes mellitus) ([place])   . Ischemic cardiomyopathy    a. echo 06/04/14 EF 40-45%, HK of entire inferolateral and inferior myocardium c.w infarct of RCA/LCx, GR2DD, mild MR  . OA (osteoarthritis) of knee   . Obesity   . Osteoarthritis   . Osteomyelitis ([place])   . Peripheral neuropathy   . Peripheral vascular disease ([place])   . Tobacco abuse     Past Surgical History:  Procedure Laterality Date  . ABDOMINAL AORTOGRAM W/LOWER EXTREMITY N/A 10/27/2016   Procedure: Abdominal Aortogram w/Lower Extremity;  Surgeon: Nelva Bush, MD;  Location: [place] [facility];  Service: Cardiovascular;  Laterality: N/A;  . AMPUTATION TOE Left 10/21/2015   Procedure: AMPUTATION TOE;  Surgeon: Sharlotte Alamo, DPM;  Location: [facility];   Service: Podiatry;  Laterality: Left;  . AMPUTATION TOE Left 05/15/2017   Procedure: AMPUTATION LEFT GREAT TOE;  Surgeon: Sharlotte Alamo, DPM;  Location: [facility];  Service: Podiatry;  Laterality: Left;  . AORTIC VALVE REPLACEMENT (AVR)/CORONARY ARTERY BYPASS GRAFTING (CABG)    . BREAST BIOPSY    . CARDIAC CATHETERIZATION  06/2014   95% stenosis mLAD, occlusion ostial OM1, 70% stenosis LCx, 95% stenosis mRCA, EF 45%.  . CORONARY ARTERY BYPASS GRAFT N/A 06/08/2014   Procedure: CORONARY ARTERY BYPASS GRAFTING (CABG);  Surgeon: Grace Isaac, MD;  Location: [facility];  Service: Open Heart Surgery;  Laterality: N/A;  Times 4 using left internal mammary artery to LAD artery and endoscopically harvested bilateral saphenous vein to Obtuse Marginal, Diagonal and Posterior Descending coronary arteries.  . CT ABD W & PELVIS WO CM  06/2014   nl liver, gallbladder, spleen, mild diverticular changes, no bowel wall inflammation, appendix nl, no hernia, no other sig abnormalities  . LOWER EXTREMITY ANGIOGRAPHY Left 05/23/2017   Procedure: LOWER EXTREMITY ANGIOGRAPHY;  Surgeon: Algernon Huxley, MD;  Location: [facility];  Service: Cardiovascular;  Laterality: Left;  . LOWER EXTREMITY ANGIOGRAPHY Left 05/28/2017   Procedure: LOWER EXTREMITY ANGIOGRAPHY;  Surgeon: Algernon Huxley, MD;  Location: [facility];  Service: Cardiovascular;  Laterality: Left;  . TEE WITHOUT CARDIOVERSION N/A 06/08/2014   Procedure: TRANSESOPHAGEAL ECHOCARDIOGRAM (TEE);  Surgeon: Grace Isaac, MD;  Location: [facility];  Service: Open  Heart Surgery;  Laterality: N/A;  . TOE AMPUTATION Right 12/2011   rt middle toe  . US ECHOCARDIOGRAPHY  06/2014   EF 50-55%, HK of inf/post/inferolat walls, Ao sclerosis   Family History:  Family History  Problem Relation Age of Onset  . CAD Mother   . Hypertension Mother   . Diabetes Mother   . CAD Father   . Diabetes Father   . Sickle cell trait Other   . Asthma Other    Family Psychiatric   History: See H&P Social History:  Social History   Substance and Sexual Activity  Alcohol Use No  . Alcohol/week: 0.0 standard drinks     Social History   Substance and Sexual Activity  Drug Use Yes   Comment: crack    Social History   Socioeconomic History  . Marital status: Widowed    Spouse name: Not on file  . Number of children: Not on file  . Years of education: Not on file  . Highest education level: Not on file  Occupational History  . Not on file  Social Needs  . Financial resource strain: Not on file  . Food insecurity:    Worry: Not on file    Inability: Not on file  . Transportation needs:    Medical: Not on file    Non-medical: Not on file  Tobacco Use  . Smoking status: Current Some Day Smoker    Packs/day: 0.00    Years: 40.00    Pack years: 0.00    Types: Cigarettes  . Smokeless tobacco: Never Used  Substance and Sexual Activity  . Alcohol use: No    Alcohol/week: 0.0 standard drinks  . Drug use: Yes    Comment: crack  . Sexual activity: Not on file  Lifestyle  . Physical activity:    Days per week: Not on file    Minutes per session: Not on file  . Stress: Not on file  Relationships  . Social connections:    Talks on phone: Not on file    Gets together: Not on file    Attends religious service: Not on file    Active member of club or organization: Not on file    Attends meetings of clubs or organizations: Not on file    Relationship status: Not on file  Other Topics Concern  . Not on file  Social History Narrative  . Not on file   Additional Social History:    Pain Medications: See MAR Prescriptions: See MAR Over the Counter: See MAR History of alcohol / drug use?: Yes Longest period of sobriety (when/how long): Unable to quantify Negative Consequences of Use: Financial, Personal relationships Name of Substance 1: Cocaine 1 - Age of First Use: Unable to quantify 1 - Amount (size/oz): Unable to quantify 1 - Frequency: Unable to  quantify 1 - Duration: Unable to quantify 1 - Last Use / Amount: "a few days ago"                  Sleep: Fair  Appetite:  Fair  Current Medications: Current Facility-Administered Medications  Medication Dose Route Frequency Provider Last Rate Last Dose  . acetaminophen (TYLENOL) tablet 650 mg  650 mg Oral Q6H PRN Clapacs, Madie Reno, MD   650 mg at 03/19/18 0803  . alum & mag hydroxide-simeth (MAALOX/MYLANTA) 200-200-20 MG/5ML suspension 30 mL  30 mL Oral Q4H PRN Clapacs, John T, MD      . aspirin EC tablet 81 mg  81 mg Oral Daily Clapacs, Madie Reno, MD   81 mg at 03/19/18 0751  . atorvastatin (LIPITOR) tablet 80 mg  80 mg Oral q1800 Clapacs, Madie Reno, MD   80 mg at 03/18/18 1715  . clopidogrel (PLAVIX) tablet 75 mg  75 mg Oral Daily Clapacs, Madie Reno, MD   75 mg at 03/19/18 0751  . collagenase (SANTYL) ointment   Topical Daily Ravi, Himabindu, MD      . FLUoxetine (PROZAC) capsule 10 mg  10 mg Oral Daily Ravi, Himabindu, MD   10 mg at 03/19/18 0751  . furosemide (LASIX) tablet 20 mg  20 mg Oral Daily PRN Henreitta Leber, MD      . gabapentin (NEURONTIN) capsule 400 mg  400 mg Oral TID Einar Grad, Himabindu, MD   400 mg at 03/19/18 1147  . insulin aspart (novoLOG) injection 3 Units  3 Units Subcutaneous TID WC Clapacs, Madie Reno, MD   3 Units at 03/19/18 1146  . insulin glargine (LANTUS) injection 60 Units  60 Units Subcutaneous BID Clapacs, Madie Reno, MD   60 Units at 03/19/18 0800  . lisinopril (PRINIVIL,ZESTRIL) tablet 5 mg  5 mg Oral Daily Henreitta Leber, MD   5 mg at 03/19/18 0751  . magnesium hydroxide (MILK OF MAGNESIA) suspension 30 mL  30 mL Oral Daily PRN Clapacs, Madie Reno, MD   30 mL at 03/15/18 1714  . metoprolol tartrate (LOPRESSOR) tablet 12.5 mg  12.5 mg Oral BID Henreitta Leber, MD   12.5 mg at 03/19/18 0751  . simethicone (MYLICON) chewable tablet 80 mg  80 mg Oral Q6H PRN He, Jun, MD   80 mg at 03/18/18 2019  . traZODone (DESYREL) tablet 100 mg  100 mg Oral QHS PRN Clapacs, Madie Reno, MD    100 mg at 03/18/18 2019    Lab Results:  Results for orders placed or performed during the hospital encounter of 03/13/18 (from the past 48 hour(s))  Glucose, capillary     Status: Abnormal   Collection Time: 03/17/18  4:29 PM  Result Value Ref Range   Glucose-Capillary 150 (H) 70 - 99 mg/dL  Glucose, capillary     Status: Abnormal   Collection Time: 03/17/18  8:38 PM  Result Value Ref Range   Glucose-Capillary 177 (H) 70 - 99 mg/dL   Comment 1 Notify RN   Glucose, capillary     Status: None   Collection Time: 03/18/18  6:52 AM  Result Value Ref Range   Glucose-Capillary 89 70 - 99 mg/dL   Comment 1 Notify RN   Glucose, capillary     Status: Abnormal   Collection Time: 03/18/18 11:50 AM  Result Value Ref Range   Glucose-Capillary 173 (H) 70 - 99 mg/dL  Glucose, capillary     Status: Abnormal   Collection Time: 03/18/18  4:03 PM  Result Value Ref Range   Glucose-Capillary 111 (H) 70 - 99 mg/dL  Glucose, capillary     Status: Abnormal   Collection Time: 03/18/18  9:27 PM  Result Value Ref Range   Glucose-Capillary 171 (H) 70 - 99 mg/dL   Comment 1 Notify RN   Glucose, capillary     Status: None   Collection Time: 03/19/18  7:39 AM  Result Value Ref Range   Glucose-Capillary 94 70 - 99 mg/dL  Glucose, capillary     Status: Abnormal   Collection Time: 03/19/18 11:43 AM  Result Value Ref Range   Glucose-Capillary 135 (H) 70 -  99 mg/dL    Blood Alcohol level:  No results found for: [facility]  Metabolic Disorder Labs: Lab Results  Component Value Date   HGBA1C 7.9 (H) 03/14/2018   MPG 180.03 03/14/2018   MPG 289.09 05/14/2017   No results found for: PROLACTIN Lab Results  Component Value Date   CHOL 92 03/14/2018   TRIG 71 03/14/2018   HDL 39 (L) 03/14/2018   CHOLHDL 2.4 03/14/2018   VLDL 14 03/14/2018   LDLCALC 39 03/14/2018   LDLCALC 103 (H) 10/10/2016    Physical Findings: AIMS:  , ,  ,  ,    CIWA:    COWS:     Musculoskeletal: Strength & Muscle Tone:  within normal limits Gait & Station: unsteady Patient leans: N/A  Psychiatric Specialty Exam: Physical Exam  Nursing note and vitals reviewed.   Review of Systems  All other systems reviewed and are negative.   Blood pressure (!) 110/57, pulse 88, temperature 98.2 F (36.8 C), temperature source Oral, resp. rate 16, height 5\' 8"  (1.727 m), weight 115.7 kg, SpO2 94 %.Body mass index is 38.77 kg/m.  General Appearance: Casual  Eye Contact:  Good  Speech:  Clear and Coherent  Volume:  Normal  Mood:  Euthymic  Affect:  Appropriate  Thought Process:  Coherent and Goal Directed  Orientation:  Full (Time, Place, and Person)  Thought Content:  Logical  Suicidal Thoughts:  No  Homicidal Thoughts:  No  Memory:  Immediate;   Fair  Judgement:  Fair  Insight:  Fair  Psychomotor Activity:  Normal  Concentration:  Concentration: Fair  Recall:  [employer] of Knowledge:  Fair  Language:  Fair  Akathisia:  No      Assets:  Resilience  ADL's:  Intact  Cognition:  WNL  Sleep:  Number of Hours: 6     Treatment Plan Summary: 62 yo female admitted due to SI and crack cocaine use. She was referred to [facility]. She is at high risk of relapse and decompensation if not discharged directly to residential treatment. SHE has not been caring for herself at home due to drug use and has led to amputations of her toes due to diabetes and cardiomyopathy. She needs substance treatment and she is motivated to pursue this.   Plan:  Mood -Prozac 10 mg daily  Cocaine use disorder -Awaiting to hear back from [facility]  Diabetes -Continue Lantus 60 units BID -Continue Novlog 3 units TID  HTN -Lisinopril 5 mg daily  CAD -Plavix and Lasix prn  Disp -[place], MD 03/19/2018, 2:26 PM

## 2018-03-19 NOTE — Progress Notes (Signed)
Recreation Therapy Notes          Melinda [name] 03/19/2018 12:22 PM 

## 2018-03-20 LAB — GLUCOSE, CAPILLARY
Glucose-Capillary: 105 mg/dL — ABNORMAL HIGH (ref 70–99)
Glucose-Capillary: 149 mg/dL — ABNORMAL HIGH (ref 70–99)
Glucose-Capillary: 116 mg/dL — ABNORMAL HIGH (ref 70–99)
Glucose-Capillary: 111 mg/dL — ABNORMAL HIGH (ref 70–99)

## 2018-03-20 MED ORDER — LOPERAMIDE HCL 2 MG PO CAPS
4.0000 mg | ORAL_CAPSULE | Freq: Once | ORAL | Status: AC
  Administered 2018-03-20: 4 mg via ORAL
  Filled 2018-03-20: qty 2

## 2018-03-20 MED ORDER — LOPERAMIDE HCL 2 MG PO CAPS: 2.0000 mg | ORAL_CAPSULE | ORAL | Status: DC | PRN

## 2018-03-20 NOTE — Plan of Care (Signed)
D: Pt denies SI/HI/AV hallucinations. Pt is pleasant and cooperative. Pt rates her depression 5/10, hopelessness 5/10, and anxiety 4/10.  A: Pt was offered support and encouragement. Pt was given scheduled medications. Pt was encourage to attend groups. Q 15 minute checks were done for safety.  R:Pt attends groups and interacts well with peers and staff. Pt is taking medication. Pt has no complaints.Pt receptive to treatment and safety maintained on unit.    Problem: Coping: Goal: Will verbalize feelings Outcome: Progressing   Problem: Health Behavior/Discharge Planning: Goal: Compliance with therapeutic regimen will improve Outcome: Progressing   Problem: Self-Concept: Goal: Will verbalize positive feelings about self Outcome: Progressing   Problem: Safety: Goal: Ability to remain free from injury will improve Outcome: Progressing

## 2018-03-20 NOTE — Plan of Care (Addendum)
Patient found visiting with family in community room upon my arrival. Patient is visible and social throughout the evening. Mood and affect are calm and pleasant. Denies SI/HI/AVH. Complains of right hand pain 6/10, given Tylenol with positive results. Patient has no scheduled HS medications and refused Trazodone. Reports, "Doctor said she would stop that one. I can't take that." Attends group. Utilizing walker for ambulation. Wheelchair also in room. Gait appears steady. CBG 111. Appetite is good. Q 15 minute checks maintained. Will continue to monitor throughout the shift. Patient slept 7.75 hours. No apparent distress. Will endorse care to oncoming shift.  Problem: Coping: Goal: Coping ability will improve Outcome: Progressing Goal: Will verbalize feelings Outcome: Progressing   Problem: Health Behavior/Discharge Planning: Goal: Compliance with therapeutic regimen will improve Outcome: Progressing   Problem: Physical Regulation: Goal: Complications related to the disease process, condition or treatment will be avoided or minimized Outcome: Progressing

## 2018-03-20 NOTE — Progress Notes (Signed)
Patient stayed in the milieu until bedtime. Attended group and maintained a positive attitude. Interacted with staff and peers appropriately.  Had a snack then presented to the medication room, to receive bedtime medications. CBG 104: no insulin coverage. Patient was pleasant with sense of humor. Requested Trazodone then went to bed. Patient fell asleep and appeared to be comfortable. Around 0200: Patient woke up calling staff for help secondary to a large BM that she had, accidentally in bed. Patient had 2 more loose stools. Was assisted to the shower and hygiene was completed.Patient reported that she was in deep sleep and "I don't even know how it happened!". Dressing change performed: wound dry and clean, no sign of infection. Patient requested medication for Diarrhea and received Imodium 4 mg by mouth per MD order. Patient was assisted back to bed. Did not have any additional concerns. Currently in bed and safety precautions reinforced.

## 2018-03-20 NOTE — Progress Notes (Addendum)
Recreation Therapy Notes  Date: 03/20/18  Time: 9:30 am  Location: Craft Room  Behavioral response: Appropriate   Intervention Topic: Team Work  Discussion/Intervention:  Group content on today was focused on teamwork. The group identified what teamwork is. Individuals described who is a part of their team. Patients expressed why they thought teamwork is important. The group stated reasons why they thought it was easier to work with a Dance movement psychotherapist team. Individuals discussed some positives and negatives of working with a team. Patients gave examples of past experiences they had while working with a team. The group participated in the intervention "Story in a bag", patients were in groups and were able to test their skill in a team setting.  Clinical Observations/Feedback:  Patient came to group and defined team work as more than one person working together coming to same conclusion. She expresses how life experiences made her cautious of the team she works with personally. Individual was social with peers and staff while participating in the intervention.  Zuri Lascala LRT/CTRS        Mikinzie Maciejewski 03/20/2018 12:21 PM

## 2018-03-20 NOTE — Progress Notes (Signed)
[facility] MD Progress Note  03/20/2018 2:33 PM Melinda [name]  MRN:  [id number] Subjective:  Pt states that depression is under better control but "I need help with my addiction issues." She denies SI or any thoughts of self harm. She is attending all groups and participating well. She is very motivated for treatment. She had fecal incontinence last night and she attributes this to trazodone and does not want to be on that at all. She states that that never has happened to her in the past. We later found out that she was declined from [facility] as crack cocaine was her primary [facility] and they do not accept people using just cocaine. CSW did sit down with her and discuss her options which are very limited due to her insurance and her medciation issues. She is open to looking into [employer].   Principal Problem: Severe recurrent major depression without psychotic features ([place]) Diagnosis: Principal Problem:   Severe recurrent major depression without psychotic features ([place])  Total Time spent with patient: 20 minutes  Past Psychiatric History: See H&P  Past Medical History:  Past Medical History:  Diagnosis Date  . Anxiety   . Coronary artery disease    a. NSTEMI 06/2014; b. 4 vessel CABG 06/08/2014 LIMA to LAD, SVG to Diagonal, SVG to OM, & SVG to PDA  . Diabetic neuropathy ([place])   . GERD (gastroesophageal reflux disease)   . Heart attack ([place])   . HTN (hypertension)   . IDDM (insulin dependent diabetes mellitus) ([place])   . Ischemic cardiomyopathy    a. echo 06/04/14 EF 40-45%, HK of entire inferolateral and inferior myocardium c.w infarct of RCA/LCx, GR2DD, mild MR  . OA (osteoarthritis) of knee   . Obesity   . Osteoarthritis   . Osteomyelitis ([place])   . Peripheral neuropathy   . Peripheral vascular disease ([place])   . Tobacco abuse     Past Surgical History:  Procedure Laterality Date  . ABDOMINAL AORTOGRAM W/LOWER EXTREMITY N/A 10/27/2016   Procedure: Abdominal Aortogram w/Lower Extremity;   Surgeon: Nelva Bush, MD;  Location: [place] [facility];  Service: Cardiovascular;  Laterality: N/A;  . AMPUTATION TOE Left 10/21/2015   Procedure: AMPUTATION TOE;  Surgeon: Sharlotte Alamo, DPM;  Location: [facility];  Service: Podiatry;  Laterality: Left;  . AMPUTATION TOE Left 05/15/2017   Procedure: AMPUTATION LEFT GREAT TOE;  Surgeon: Sharlotte Alamo, DPM;  Location: [facility];  Service: Podiatry;  Laterality: Left;  . AORTIC VALVE REPLACEMENT (AVR)/CORONARY ARTERY BYPASS GRAFTING (CABG)    . BREAST BIOPSY    . CARDIAC CATHETERIZATION  06/2014   95% stenosis mLAD, occlusion ostial OM1, 70% stenosis LCx, 95% stenosis mRCA, EF 45%.  . CORONARY ARTERY BYPASS GRAFT N/A 06/08/2014   Procedure: CORONARY ARTERY BYPASS GRAFTING (CABG);  Surgeon: Grace Isaac, MD;  Location: [place];  Service: Open Heart Surgery;  Laterality: N/A;  Times 4 using left internal mammary artery to LAD artery and endoscopically harvested bilateral saphenous vein to Obtuse Marginal, Diagonal and Posterior Descending coronary arteries.  . CT ABD W & PELVIS WO CM  06/2014   nl liver, gallbladder, spleen, mild diverticular changes, no bowel wall inflammation, appendix nl, no hernia, no other sig abnormalities  . LOWER EXTREMITY ANGIOGRAPHY Left 05/23/2017   Procedure: LOWER EXTREMITY ANGIOGRAPHY;  Surgeon: Algernon Huxley, MD;  Location: [place] [facility];  Service: Cardiovascular;  Laterality: Left;  . LOWER EXTREMITY ANGIOGRAPHY Left 05/28/2017   Procedure: LOWER EXTREMITY ANGIOGRAPHY;  Surgeon:  Algernon Huxley, MD;  Location: [place] [facility];  Service: Cardiovascular;  Laterality: Left;  . TEE WITHOUT CARDIOVERSION N/A 06/08/2014   Procedure: TRANSESOPHAGEAL ECHOCARDIOGRAM (TEE);  Surgeon: Grace Isaac, MD;  Location: [place];  Service: Open Heart Surgery;  Laterality: N/A;  . TOE AMPUTATION Right 12/2011   rt middle toe  . US ECHOCARDIOGRAPHY  06/2014   EF 50-55%, HK of inf/post/inferolat walls, Ao sclerosis   Family History:   Family History  Problem Relation Age of Onset  . CAD Mother   . Hypertension Mother   . Diabetes Mother   . CAD Father   . Diabetes Father   . Sickle cell trait Other   . Asthma Other    Family Psychiatric  History: See H&P Social History:  Social History   Substance and Sexual Activity  Alcohol Use No  . Alcohol/week: 0.0 standard drinks     Social History   Substance and Sexual Activity  Drug Use Yes   Comment: crack    Social History   Socioeconomic History  . Marital status: Widowed    Spouse name: Not on file  . Number of children: Not on file  . Years of education: Not on file  . Highest education level: Not on file  Occupational History  . Not on file  Social Needs  . Financial resource strain: Not on file  . Food insecurity:    Worry: Not on file    Inability: Not on file  . Transportation needs:    Medical: Not on file    Non-medical: Not on file  Tobacco Use  . Smoking status: Current Some Day Smoker    Packs/day: 0.00    Years: 40.00    Pack years: 0.00    Types: Cigarettes  . Smokeless tobacco: Never Used  Substance and Sexual Activity  . Alcohol use: No    Alcohol/week: 0.0 standard drinks  . Drug use: Yes    Comment: crack  . Sexual activity: Not on file  Lifestyle  . Physical activity:    Days per week: Not on file    Minutes per session: Not on file  . Stress: Not on file  Relationships  . Social connections:    Talks on phone: Not on file    Gets together: Not on file    Attends religious service: Not on file    Active member of club or organization: Not on file    Attends meetings of clubs or organizations: Not on file    Relationship status: Not on file  Other Topics Concern  . Not on file  Social History Narrative  . Not on file   Additional Social History:    Pain Medications: See MAR Prescriptions: See MAR Over the Counter: See MAR History of alcohol / drug use?: Yes Longest period of sobriety (when/how long):  Unable to quantify Negative Consequences of Use: Financial, Personal relationships Name of Substance 1: Cocaine 1 - Age of First Use: Unable to quantify 1 - Amount (size/oz): Unable to quantify 1 - Frequency: Unable to quantify 1 - Duration: Unable to quantify 1 - Last Use / Amount: "a few days ago"                  Sleep: Good  Appetite:  Good  Current Medications: Current Facility-Administered Medications  Medication Dose Route Frequency Provider Last Rate Last Dose  . acetaminophen (TYLENOL) tablet 650 mg  650 mg Oral Q6H PRN Clapacs, John  T, MD   650 mg at 03/20/18 0849  . alum & mag hydroxide-simeth (MAALOX/MYLANTA) 200-200-20 MG/5ML suspension 30 mL  30 mL Oral Q4H PRN Clapacs, John T, MD      . aspirin EC tablet 81 mg  81 mg Oral Daily Clapacs, Madie Reno, MD   81 mg at 03/20/18 0845  . atorvastatin (LIPITOR) tablet 80 mg  80 mg Oral q1800 Clapacs, Madie Reno, MD   80 mg at 03/19/18 1628  . clopidogrel (PLAVIX) tablet 75 mg  75 mg Oral Daily Clapacs, Madie Reno, MD   75 mg at 03/20/18 0842  . collagenase (SANTYL) ointment   Topical Daily Ravi, Himabindu, MD      . FLUoxetine (PROZAC) capsule 10 mg  10 mg Oral Daily Ravi, Himabindu, MD   10 mg at 03/20/18 0845  . furosemide (LASIX) tablet 20 mg  20 mg Oral Daily PRN Henreitta Leber, MD      . gabapentin (NEURONTIN) capsule 400 mg  400 mg Oral TID Einar Grad, Himabindu, MD   400 mg at 03/20/18 0842  . insulin aspart (novoLOG) injection 3 Units  3 Units Subcutaneous TID WC Clapacs, Madie Reno, MD   3 Units at 03/20/18 [id number]  . insulin glargine (LANTUS) injection 60 Units  60 Units Subcutaneous BID Clapacs, Madie Reno, MD   60 Units at 03/20/18 0845  . lisinopril (PRINIVIL,ZESTRIL) tablet 5 mg  5 mg Oral Daily Henreitta Leber, MD   5 mg at 03/19/18 0751  . loperamide (IMODIUM) capsule 2 mg  2 mg Oral Q4H PRN Clapacs, John T, MD      . magnesium hydroxide (MILK OF MAGNESIA) suspension 30 mL  30 mL Oral Daily PRN Clapacs, Madie Reno, MD   30 mL at 03/15/18  1714  . metoprolol tartrate (LOPRESSOR) tablet 12.5 mg  12.5 mg Oral BID Henreitta Leber, MD   12.5 mg at 03/20/18 0842  . simethicone (MYLICON) chewable tablet 80 mg  80 mg Oral Q6H PRN He, Jun, MD   80 mg at 03/18/18 2019  . traZODone (DESYREL) tablet 100 mg  100 mg Oral QHS PRN Clapacs, Madie Reno, MD   100 mg at 03/19/18 2111    Lab Results:  Results for orders placed or performed during the hospital encounter of 03/13/18 (from the past 48 hour(s))  Glucose, capillary     Status: Abnormal   Collection Time: 03/18/18  4:03 PM  Result Value Ref Range   Glucose-Capillary 111 (H) 70 - 99 mg/dL  Glucose, capillary     Status: Abnormal   Collection Time: 03/18/18  9:27 PM  Result Value Ref Range   Glucose-Capillary 171 (H) 70 - 99 mg/dL   Comment 1 Notify RN   Glucose, capillary     Status: None   Collection Time: 03/19/18  7:39 AM  Result Value Ref Range   Glucose-Capillary 94 70 - 99 mg/dL  Glucose, capillary     Status: Abnormal   Collection Time: 03/19/18 11:43 AM  Result Value Ref Range   Glucose-Capillary 135 (H) 70 - 99 mg/dL  Glucose, capillary     Status: Abnormal   Collection Time: 03/19/18  4:31 PM  Result Value Ref Range   Glucose-Capillary 140 (H) 70 - 99 mg/dL   Comment 1 Notify RN   Glucose, capillary     Status: Abnormal   Collection Time: 03/19/18  9:09 PM  Result Value Ref Range   Glucose-Capillary 104 (H) 70 - 99 mg/dL  Comment 1 Notify RN   Glucose, capillary     Status: Abnormal   Collection Time: 03/20/18  7:02 AM  Result Value Ref Range   Glucose-Capillary 105 (H) 70 - 99 mg/dL   Comment 1 Notify RN   Glucose, capillary     Status: Abnormal   Collection Time: 03/20/18 11:42 AM  Result Value Ref Range   Glucose-Capillary 149 (H) 70 - 99 mg/dL    Blood Alcohol level:  No results found for: [facility]  Metabolic Disorder Labs: Lab Results  Component Value Date   HGBA1C 7.9 (H) 03/14/2018   MPG 180.03 03/14/2018   MPG 289.09 05/14/2017   No results  found for: PROLACTIN Lab Results  Component Value Date   CHOL 92 03/14/2018   TRIG 71 03/14/2018   HDL 39 (L) 03/14/2018   CHOLHDL 2.4 03/14/2018   VLDL 14 03/14/2018   LDLCALC 39 03/14/2018   LDLCALC 103 (H) 10/10/2016    Physical Findings: AIMS:  , ,  ,  ,    CIWA:    COWS:     Musculoskeletal: Strength & Muscle Tone: Good Gait & Station: unsteady Patient leans: N/A  Psychiatric Specialty Exam: Physical Exam  Nursing note and vitals reviewed.   Review of Systems  All other systems reviewed and are negative.   Blood pressure 119/68, pulse 86, temperature 97.9 F (36.6 C), temperature source Oral, resp. rate 18, height 5\' 8"  (1.727 m), weight 115.7 kg, SpO2 97 %.Body mass index is 38.77 kg/m.  General Appearance: Casual  Eye Contact:  Good  Speech:  Clear and Coherent  Volume:  Normal  Mood:  Euthymic  Affect:  Appropriate  Thought Process:  Coherent and Goal Directed  Orientation:  Full (Time, Place, and Person)  Thought Content:  Logical  Suicidal Thoughts:  No  Homicidal Thoughts:  No  Memory:  Immediate;   Fair  Judgement:  Good  Insight:  Good  Psychomotor Activity:  Normal  Concentration:  Concentration: Good  Recall:  [place] of Knowledge:  Fair  Language:  Fair  Akathisia:  No      Assets:  Resilience  ADL's:  Intact  Cognition:  WNL  Sleep:  Number of Hours: 5.15     Treatment Plan Summary: 62 yo female admitted due to SI and crack cocaine use. She was denied from [facility] which is very unfortunate as she was very motivated to pursue residential treatment. There are waiting lists on many programs and she is also limited due to her insurance and medical issues. CSW did speak with her and she is open to [facility] if there is a program that takes her insurance. She will need to be discharged likely tomorrow.  Plan:  Mood -Continue Prozac 10 mg daily  Cocaine use disorder -Declined from [facility] as crack cocaine is primary [place]. She is open to  [facility]  Diabetes -Continue Lantus 60 units BID -Novolog 3 units TID  HTN -Lisinopril 5 mg daily  CAD -Plavix and Lasix prn  Dispo -Declined from [facility]. CSW looking into [facility]. Will discharge in next day or so Marylin Crosby, MD 03/20/2018, 2:33 PM

## 2018-03-20 NOTE — Plan of Care (Signed)
Cooperative and pleasant. Denying SI/HI. Present in the milieu.

## 2018-03-20 NOTE — Progress Notes (Signed)
PT Cancellation Note  Patient Details Name: Melinda [name] MRN: [id number] DOB: 27-Jun-1955   Cancelled Treatment:    Reason Eval/Treat Not Completed: Other (comment);Patient declined, no reason specified(Pt in day room, RW nearby. Patient reported that someone was going to check her blood sugar in prep for lunch, and should would prefer to work with PT later or tomorrow. PT will follow as able.)   Lieutenant Diego PT, DPT 11:41 AM,03/20/18 [phone]

## 2018-03-21 LAB — GLUCOSE, CAPILLARY
Glucose-Capillary: 87 mg/dL (ref 70–99)
Glucose-Capillary: 122 mg/dL — ABNORMAL HIGH (ref 70–99)

## 2018-03-21 MED ORDER — METOPROLOL TARTRATE 25 MG PO TABS: 12.5000 mg | ORAL_TABLET | Freq: Two times a day (BID) | ORAL | 0 refills | Status: DC

## 2018-03-21 MED ORDER — GABAPENTIN 100 MG PO CAPS: 100.0000 mg | ORAL_CAPSULE | Freq: Three times a day (TID) | ORAL | 0 refills | Status: DC

## 2018-03-21 MED ORDER — LISINOPRIL 5 MG PO TABS: 5.0000 mg | ORAL_TABLET | Freq: Every day | ORAL | 0 refills | Status: DC

## 2018-03-21 MED ORDER — GABAPENTIN 100 MG PO CAPS: 500.0000 mg | ORAL_CAPSULE | Freq: Three times a day (TID) | ORAL | Status: DC

## 2018-03-21 MED ORDER — INSULIN GLARGINE 100 UNIT/ML ~~LOC~~ SOLN: 60.0000 [IU] | Freq: Two times a day (BID) | SUBCUTANEOUS | Status: DC

## 2018-03-21 MED ORDER — POTASSIUM CHLORIDE CRYS ER 20 MEQ PO TBCR: 20.0000 meq | EXTENDED_RELEASE_TABLET | Freq: Every day | ORAL | Status: DC | PRN

## 2018-03-21 MED ORDER — INSULIN ASPART 100 UNIT/ML ~~LOC~~ SOLN: 3.0000 [IU] | Freq: Three times a day (TID) | SUBCUTANEOUS | 0 refills | Status: DC

## 2018-03-21 MED ORDER — FLUOXETINE HCL 10 MG PO CAPS: 10.0000 mg | ORAL_CAPSULE | Freq: Every day | ORAL | 0 refills | Status: DC

## 2018-03-21 MED ORDER — FUROSEMIDE 20 MG PO TABS: 20.0000 mg | ORAL_TABLET | Freq: Every day | ORAL | Status: DC | PRN

## 2018-03-21 NOTE — Progress Notes (Signed)
Recreation Therapy Notes  Date: 11/ 21/19  Time: 9:30 am  Location: Craft Room  Behavioral response: Appropriate   Intervention Topic: Emotions  Discussion/Intervention:  Group content on today was focused on emotions. The group identified what emotions are and why it is important to have emotions. Patients expressed some positive and negative emotions. Individuals gave some past experiences on how they normally dealt with emotions in the past. The group described some positive ways to deal with emotions in the future. Patients participated in the intervention "Name the Melinda [name]" where individuals were given a chance to experience different emotions.  Clinical Observations/Feedback:  Patient came to group late due to unknown reasons.Individual was social with peers and staff while participating in the intervention.  [name] LRT/CTRS        [place] 03/21/2018 1:02 PM

## 2018-03-21 NOTE — Discharge Summary (Signed)
Physician Discharge Summary Note  Patient:  Melinda [name] is an 62 y.o., female MRN:  [id number] DOB:  05/31/1955 Patient phone:  [phone] (home)  Patient address:   [place] [address],  Total Time spent with patient: 20 minutes  Plus 20 minutes of medication reconciliation, discharge planning, and discharge documentation   Date of Admission:  03/13/2018 Date of Discharge: 03/21/18  Reason for Admission:  Patient is a 62 year old woman with a long history of multiple medical problems including diabetes, hyper tension, peripheral neuropathy, ischemic cardiomyopathy and the use of drugs and alcohol to the emergency room complaining of a fall but then reported she was very depressed and that she was abusing cocaine.  Patient reports that her mood has been quite depressed for a few months now.  She has been ruminating about all the bad things that happened in her life.  She reports having more negative thoughts.  She has not been sleeping well and has been eating a lot.  Reports that she has been using crack cocaine and using all her money on it.  She has had periods of clean times but had relapsed recently. She did see her primary care doctor for depression and was prescribed various medications. She is unable to recall the names. States they did not work for her, one of them made her feel suicidal. We discussed starting Prozac but patient is not willing to start right away, she will think about it.     She also reports having frequent intrusive thoughts about death and dying.  She denies any suicidal thoughts actively.  She denies having any visual or auditory hallucinations.  Principal Problem: Severe recurrent major depression without psychotic features [facility]) Discharge Diagnoses: Principal Problem:   Severe recurrent major depression without psychotic features [facility])   Past Psychiatric History: See H&P  Past Medical History:  Past Medical History:  Diagnosis  Date  . Anxiety   . Coronary artery disease    a. NSTEMI 06/2014; b. 4 vessel CABG 06/08/2014 LIMA to LAD, SVG to Diagonal, SVG to OM, & SVG to PDA  . Diabetic neuropathy ([place])   . GERD (gastroesophageal reflux disease)   . Heart attack ([place])   . HTN (hypertension)   . IDDM (insulin dependent diabetes mellitus) ([place])   . Ischemic cardiomyopathy    a. echo 06/04/14 EF 40-45%, HK of entire inferolateral and inferior myocardium c.w infarct of RCA/LCx, GR2DD, mild MR  . OA (osteoarthritis) of knee   . Obesity   . Osteoarthritis   . Osteomyelitis ([place])   . Peripheral neuropathy   . Peripheral vascular disease ([place])   . Tobacco abuse     Past Surgical History:  Procedure Laterality Date  . ABDOMINAL AORTOGRAM W/LOWER EXTREMITY N/A 10/27/2016   Procedure: Abdominal Aortogram w/Lower Extremity;  Surgeon: Nelva Bush, MD;  Location: [place] [facility];  Service: Cardiovascular;  Laterality: N/A;  . AMPUTATION TOE Left 10/21/2015   Procedure: AMPUTATION TOE;  Surgeon: Sharlotte Alamo, DPM;  Location: [facility] ORS;  Service: Podiatry;  Laterality: Left;  . AMPUTATION TOE Left 05/15/2017   Procedure: AMPUTATION LEFT GREAT TOE;  Surgeon: Sharlotte Alamo, DPM;  Location: [facility] OR[facility];  Service: Podiatry;  Laterality: Left;  . AORTIC VALVE REPLACEMENT (AVR)/CORONARY ARTERY BYPASS GRAFTING (CABG)    . BREAST BIOPSY    . CARDIAC CATHETERIZATION  06/2014   95% stenosis mLAD, occlusion ostial OM1, 70% stenosis LCx, 95% stenosis mRCA, EF 45%.  . CORONARY ARTERY BYPASS GRAFT N/A  06/08/2014   Procedure: CORONARY ARTERY BYPASS GRAFTING (CABG);  Surgeon: Grace Isaac, MD;  Location: [place];  Service: Open Heart Surgery;  Laterality: N/A;  Times 4 using left internal mammary artery to LAD artery and endoscopically harvested bilateral saphenous vein to Obtuse Marginal, Diagonal and Posterior Descending coronary arteries.  . CT ABD W & PELVIS WO CM  06/2014   nl liver, gallbladder, spleen, mild diverticular changes, no bowel  wall inflammation, appendix nl, no hernia, no other sig abnormalities  . LOWER EXTREMITY ANGIOGRAPHY Left 05/23/2017   Procedure: LOWER EXTREMITY ANGIOGRAPHY;  Surgeon: Algernon Huxley, MD;  Location: [place] [facility];  Service: Cardiovascular;  Laterality: Left;  . LOWER EXTREMITY ANGIOGRAPHY Left 05/28/2017   Procedure: LOWER EXTREMITY ANGIOGRAPHY;  Surgeon: Algernon Huxley, MD;  Location: [place] [facility];  Service: Cardiovascular;  Laterality: Left;  . TEE WITHOUT CARDIOVERSION N/A 06/08/2014   Procedure: TRANSESOPHAGEAL ECHOCARDIOGRAM (TEE);  Surgeon: Grace Isaac, MD;  Location: [place];  Service: Open Heart Surgery;  Laterality: N/A;  . TOE AMPUTATION Right 12/2011   rt middle toe  . US ECHOCARDIOGRAPHY  06/2014   EF 50-55%, HK of inf/post/inferolat walls, Ao sclerosis   Family History:  Family History  Problem Relation Age of Onset  . CAD Mother   . Hypertension Mother   . Diabetes Mother   . CAD Father   . Diabetes Father   . Sickle cell trait Other   . Asthma Other    Family Psychiatric  History: See H&P Social History:  Social History   Substance and Sexual Activity  Alcohol Use No  . Alcohol/week: 0.0 standard drinks     Social History   Substance and Sexual Activity  Drug Use Yes   Comment: crack    Social History   Socioeconomic History  . Marital status: Widowed    Spouse name: Not on file  . Number of children: Not on file  . Years of education: Not on file  . Highest education level: Not on file  Occupational History  . Not on file  Social Needs  . Financial resource strain: Not on file  . Food insecurity:    Worry: Not on file    Inability: Not on file  . Transportation needs:    Medical: Not on file    Non-medical: Not on file  Tobacco Use  . Smoking status: Current Some Day Smoker    Packs/day: 0.00    Years: 40.00    Pack years: 0.00    Types: Cigarettes  . Smokeless tobacco: Never Used  Substance and Sexual Activity  . Alcohol use:  No    Alcohol/week: 0.0 standard drinks  . Drug use: Yes    Comment: crack  . Sexual activity: Not on file  Lifestyle  . Physical activity:    Days per week: Not on file    Minutes per session: Not on file  . Stress: Not on file  Relationships  . Social connections:    Talks on phone: Not on file    Gets together: Not on file    Attends religious service: Not on file    Active member of club or organization: Not on file    Attends meetings of clubs or organizations: Not on file    Relationship status: Not on file  Other Topics Concern  . Not on file  Social History Narrative  . Not on file    Hospital Course:  Pt was started  on Prozac 10 mg for anxiety and depression, Hospitalist was consulted for management of medical issues. She did have some hypotensive episodes on the unit so BP meds were decreased. She was also started on Novolog with meals for glucose management. Gabapentin was lowered to 500 mg TID as this lower dose was effective for neuropathy. Pt was referred to [facility] but was declined as they do not offer treatment if only DOC is cocaine. Pt accepted this news well. She participated well in groups and really did benefit from them. She was calm and pleasant on the unit and social with peers. She has good insight into herself and plans to pursue SAIOP. She consistently denied SI or any thoughts of self harm. On day of discharge, she had brighten affect. She had good sense of humor. She feels safe to discharge home today. She plans to pursue SAIOP. We went through her medciation changes that were made and she is in agreement with this. She feels the Prozac has been very helpful. She denies SI, HI, AH, VH.   The patient is at low risk of imminent suicide. Patient denied thoughts, intent, or plan for harm to self or others, expressed significant future orientation, and expressed an ability to mobilize assistance for her needs. She is presently void of any contributing psychiatric  symptoms, cognitive difficulties, or substance use which would elevate her risk for lethality. Chronic risk for lethality is elevated in light of substance abuse. The chronic risk is presently mitigated by her ongoing desire and engagement in [facility] treatment and mobilization of support from family and friends. Chronic risk may elevate if she experiences any significant loss or worsening of symptoms, which can be managed and monitored through outpatient providers. At this time,a cute risk for lethality is low and she is stable for ongoing outpatient management.   Modifiable risk factors were addressed during this hospitalization through appropriate pharmacotherapy and establishment of outpatient follow-up treatment. Some risk factors for suicide are situational (i.e. Unstable housing) or related personality pathology (i.e. Poor coping mechanisms) and thus cannot be further mitigated by continued hospitalization in this setting.    Physical Findings: AIMS:  , ,  ,  ,    CIWA:    COWS:     Musculoskeletal: Strength & Muscle Tone: within normal limits Gait & Station: unsteady Patient leans: N/A  Psychiatric Specialty Exam: Physical Exam  Nursing note and vitals reviewed.   Review of Systems  All other systems reviewed and are negative.   Blood pressure (!) 119/44, pulse 60, temperature 98.2 F (36.8 C), temperature source Oral, resp. rate 18, height 5\' 8"  (1.727 m), weight 115.7 kg, SpO2 96 %.Body mass index is 38.77 kg/m.  General Appearance: Casual  Eye Contact:  Good  Speech:  Clear and Coherent  Volume:  Normal  Mood:  Euthymic  Affect:  Congruent  Thought Process:  Coherent and Goal Directed  Orientation:  Full (Time, Place, and Person)  Thought Content:  Illogical  Suicidal Thoughts:  No  Homicidal Thoughts:  No  Memory:  Recent;   Fair  Judgement:  Fair  Insight:  Fair  Psychomotor Activity:  Normal  Concentration:  Attention Span: Fair  Recall:  [employer] of Knowledge:   Fair  Language:  Fair  Akathisia:  No      Assets:  Resilience  ADL's:  Intact  Cognition:  WNL  Sleep:  Number of Hours: 7.75     Have you used any form of tobacco in  the last 30 days? (Cigarettes, Smokeless Tobacco, Cigars, and/or Pipes): No  Has this patient used any form of tobacco in the last 30 days? (Cigarettes, Smokeless Tobacco, Cigars, and/or Pipes) No  Blood Alcohol level:  No results found for: [facility]  Metabolic Disorder Labs:  Lab Results  Component Value Date   HGBA1C 7.9 (H) 03/14/2018   MPG 180.03 03/14/2018   MPG 289.09 05/14/2017   No results found for: PROLACTIN Lab Results  Component Value Date   CHOL 92 03/14/2018   TRIG 71 03/14/2018   HDL 39 (L) 03/14/2018   CHOLHDL 2.4 03/14/2018   VLDL 14 03/14/2018   LDLCALC 39 03/14/2018   LDLCALC 103 (H) 10/10/2016    See Psychiatric Specialty Exam and Suicide Risk Assessment completed by Attending Physician prior to discharge.  Discharge destination:  Home  Is patient on multiple antipsychotic therapies at discharge:  No   Has Patient had three or more failed trials of antipsychotic monotherapy by history:  No  Recommended Plan for Multiple Antipsychotic Therapies: NA  Discharge Instructions    Diet - low sodium heart healthy   Complete by:  As directed    Increase activity slowly   Complete by:  As directed      Allergies as of 03/21/2018   No Known Allergies     Medication List    STOP taking these medications   escitalopram 10 MG tablet Commonly known as:  LEXAPRO   varenicline 0.5 MG X 11 & 1 MG X 42 tablet Commonly known as:  CHANTIX PAK   varenicline 1 MG tablet Commonly known as:  CHANTIX     TAKE these medications     Indication  aspirin 81 MG EC tablet Take 1 tablet (81 mg total) by mouth daily.  Indication:  Joint Damage causing Pain and Loss of Function   atorvastatin 80 MG tablet Commonly known as:  LIPITOR Take 1 tablet (80 mg total) by mouth daily at 6 PM.   Indication:  High Amount of Fats in the Blood   clopidogrel 75 MG tablet Commonly known as:  PLAVIX Take 1 tablet (75 mg total) by mouth daily.  Indication:  Acute Coronary Syndrome   FLUoxetine 10 MG capsule Commonly known as:  PROZAC Take 1 capsule (10 mg total) by mouth daily.  Indication:  Depression   furosemide 20 MG tablet Commonly known as:  LASIX Take 1 tablet (20 mg total) by mouth daily as needed for fluid.  Indication:  Edema   gabapentin 100 MG capsule Commonly known as:  NEURONTIN Take 5 capsules (500 mg total) by mouth 3 (three) times daily. What changed:    medication strength  how much to take  Indication:  Neuropathic Pain   insulin aspart 100 UNIT/ML injection Commonly known as:  novoLOG Inject 3 Units into the skin 3 (three) times daily with meals.  Indication:  Type 2 Diabetes   insulin glargine 100 UNIT/ML injection Commonly known as:  LANTUS Inject 0.6 mLs (60 Units total) into the skin 2 (two) times daily.  Indication:  Type 2 Diabetes   lisinopril 5 MG tablet Commonly known as:  PRINIVIL,ZESTRIL Take 1 tablet (5 mg total) by mouth daily. What changed:    medication strength  how much to take  Indication:  High Blood Pressure Disorder   metoprolol tartrate 25 MG tablet Commonly known as:  LOPRESSOR Take 0.5 tablets (12.5 mg total) by mouth 2 (two) times daily. What changed:  how much to take  Indication:  High Blood Pressure Disorder   naproxen 500 MG tablet Commonly known as:  NAPROSYN Take 500 mg by mouth 2 (two) times daily.  Indication:  Joint Damage causing Pain and Loss of Function   oxybutynin 5 MG tablet Commonly known as:  DITROPAN Take 5 mg by mouth 2 (two) times daily.  Indication:  Frequent Urination   potassium chloride SA 20 MEQ tablet Commonly known as:  K-DUR,KLOR-CON Take 1 tablet (20 mEq total) by mouth daily as needed (with Lasix).  Indication:  Low Amount of Potassium in the Blood      Follow-up  Information    Pc, [employer]. Go to.   Why:  Please follow up at [facility], walk in clinic hours are Monday-Friday at 9am-4pm. Please bring photo ID, insurance card and medication. Thank you. Contact information: [place] [address] [phone]             Signed: Marylin Crosby, MD 03/21/2018, 9:52 AM

## 2018-03-21 NOTE — BHH Counselor (Signed)
Pt [facility] application  was denied on 03/20/18 due to cocaine being only drug of choice.

## 2018-03-21 NOTE — Care Management (Signed)
Rolling walker requested from [facility]. If patient has already left the building, it may be picked up at store on [address]. [facility].

## 2018-03-21 NOTE — BHH Suicide Risk Assessment (Signed)
[facility] Discharge Suicide Risk Assessment   Principal Problem: Severe recurrent major depression without psychotic features [facility]) Discharge Diagnoses: Principal Problem:   Severe recurrent major depression without psychotic features ([place])   Mental Status Per Nursing Assessment::   On Admission:  NA(Denies)  Demographic Factors:  Living alone and Unemployed  Loss Factors: Financial problems/change in socioeconomic status  Historical Factors: Impulsivity  Risk Reduction Factors:   Positive social support and Positive coping skills or problem solving skills  Continued Clinical Symptoms:  Alcohol/Substance Abuse/Dependencies  Cognitive Features That Contribute To Risk:  None    Suicide Risk:  Mild Acute Risk:  Suicidal ideation of limited frequency, intensity, duration, and specificity.    Follow-up Information    [facility], [employer] Follow up.   Contact information: [place] [address] [phone]            Marylin Crosby, MD 03/21/2018, 9:51 AM

## 2018-03-21 NOTE — Progress Notes (Signed)
  [facility] Adult Case Management Discharge Plan :  Will you be returning to the same living situation after discharge:  Yes,  lives alone At discharge, do you have transportation home?: Yes,  pt's friend will pick up at discharge Do you have the ability to pay for your medications: Yes,  insurance, income  Release of information consent forms completed and in the chart;  Patient's signature needed at discharge.  Patient to Follow up at: Follow-up Information    Pc, [employer]. Go to.   Why:  Please follow up at [facility], walk in clinic hours are Monday-Friday at 9am-4pm. Please bring photo ID, insurance card and medication. Thank you. Contact information: [place] [address] [phone]           Next level of care provider has access to [place] and Suicide Prevention discussed: Yes,  with pt, refused contact with family  Have you used any form of tobacco in the last 30 days? (Cigarettes, Smokeless Tobacco, Cigars, and/or Pipes): No  Has patient been referred to the Quitline?: N/A patient is not a smoker  Patient has been referred for addiction treatment: N/A  Yvette Rack, LCSW 03/21/2018, 10:24 AM

## 2018-03-21 NOTE — Progress Notes (Signed)
Recreation Therapy Notes  INPATIENT RECREATION TR PLAN  Patient Details Name: Melinda [name] MRN: [id number] DOB: [birth year] Today's Date: 03/21/2018  Rec Therapy Plan Is patient appropriate for Therapeutic Recreation?: Yes Treatment times per week: At least 3 Estimated Length of Stay: 5-7 days TR Treatment/Interventions: Group participation (Comment)  Discharge Criteria Pt will be discharged from therapy if:: Discharged Treatment plan/goals/alternatives discussed and agreed upon by:: Patient/family  Discharge Summary Short term goals set: Patient will identify 3 resources in their community to aid in recovery within 5 recreation therapy group sessions Short term goals met: Adequate for discharge Progress toward goals comments: Groups attended Which groups?: Other (Comment)(Happiness, Team work, Emotions) Reason goals not met: N/A Therapeutic equipment acquired: N/A Reason patient discharged from therapy: Discharge from hospital Pt/family agrees with progress & goals achieved: Yes Date patient discharged from therapy: 03/21/18   Eian Vandervelden 03/21/2018, 3:49 PM

## 2018-03-21 NOTE — Progress Notes (Signed)
Patient denies SI/HI, denies A/V hallucinations. Patient verbalizes understanding of discharge instructions, follow up care and prescriptions.Walker given to patient. Patient given all belongings from [facility] locker. Patient escorted out by staff, transported by friend.

## 2018-04-15 ENCOUNTER — Other Ambulatory Visit: Payer: Self-pay | Admitting: Cardiovascular Disease

## 2018-04-15 DIAGNOSIS — Z794 Long term (current) use of insulin: Secondary | ICD-10-CM

## 2018-04-15 DIAGNOSIS — E1159 Type 2 diabetes mellitus with other circulatory complications: Secondary | ICD-10-CM

## 2018-04-15 DIAGNOSIS — I2510 Atherosclerotic heart disease of native coronary artery without angina pectoris: Secondary | ICD-10-CM

## 2018-05-31 ENCOUNTER — Telehealth: Payer: Self-pay | Admitting: Cardiovascular Disease

## 2018-05-31 NOTE — Telephone Encounter (Signed)
Can you please verify this is the correct instructions for continuing chantix.   Thanks !

## 2018-05-31 NOTE — Telephone Encounter (Signed)
Discussed Chantix with patient. She says she's basically quit smoking but will smoke a cigarette off and on. Sometimes just one per patient. She says she still takes Chantix at times. She is due for 12 month follow up this month as well. Says she still has some pills left. Scheduled her at the end of February to see Ignacia Bayley, NP. She will discuss further need of Chantix at that time. Rx refused at this time.

## 2018-06-26 ENCOUNTER — Other Ambulatory Visit: Payer: Self-pay | Admitting: Surgery

## 2018-06-26 ENCOUNTER — Other Ambulatory Visit (HOSPITAL_COMMUNITY): Payer: Self-pay | Admitting: Surgery

## 2018-06-27 ENCOUNTER — Encounter: Payer: Self-pay | Admitting: Nurse Practitioner

## 2018-06-27 ENCOUNTER — Ambulatory Visit (INDEPENDENT_AMBULATORY_CARE_PROVIDER_SITE_OTHER): Payer: Medicare HMO | Admitting: Nurse Practitioner

## 2018-06-27 VITALS — BP 137/77 | HR 91 | Ht 68.5 in | Wt 288.5 lb

## 2018-06-27 DIAGNOSIS — I2510 Atherosclerotic heart disease of native coronary artery without angina pectoris: Secondary | ICD-10-CM | POA: Diagnosis not present

## 2018-06-27 DIAGNOSIS — I255 Ischemic cardiomyopathy: Secondary | ICD-10-CM

## 2018-06-27 DIAGNOSIS — I10 Essential (primary) hypertension: Secondary | ICD-10-CM

## 2018-06-27 DIAGNOSIS — Z720 Tobacco use: Secondary | ICD-10-CM

## 2018-06-27 DIAGNOSIS — E785 Hyperlipidemia, unspecified: Secondary | ICD-10-CM

## 2018-06-27 DIAGNOSIS — I739 Peripheral vascular disease, unspecified: Secondary | ICD-10-CM

## 2018-06-27 NOTE — Progress Notes (Signed)
Office Visit    Patient Name: Melinda [name] Date of Encounter: 06/27/2018  Primary Care Provider:  Donnie Coffin, MD Primary Cardiologist:  Ida Rogue, MD  Chief Complaint    63 y/o ? with a h/o CAD s/p CABG x 4 (2016), HTN, HL, IDDM, tob abuse, PAD, obesity, anxiety, and ICM/HFrEf, who presents for f/u of CAD.  Past Medical History    Past Medical History:  Diagnosis Date  . Anxiety   . Coronary artery disease    a. 06/2014 NSTEMI s/p CABG x 4 (LIMA to LAD, VG to Diag, VG to OM, VG to PDA).  . Diabetic neuropathy ([place])   . GERD (gastroesophageal reflux disease)   . HTN (hypertension)   . Hyperlipidemia LDL goal <70   . IDDM (insulin dependent diabetes mellitus) ([place])   . Ischemic cardiomyopathy    a. 06/2014 Echo: EF 40-45%, HK of entire inferolateral and inferior myocardium c/w infarct of RCA/LCx, GR2DD, mild MR  . OA (osteoarthritis) of knee   . Obesity   . Osteoarthritis   . Osteomyelitis ([place])   . Peripheral neuropathy   . Peripheral vascular disease ([place])    a. 09/2016 Periph Angio: CTO R popliteal, CTO L prox/mid SFA->Med Rx; b. 05/2017 PTA: LSFA (Viabahn covered stent x 2), DEB to L Post Tibial; c. 06/2017 ABI: R 0.44, L 1.05.  . Tobacco abuse    Past Surgical History:  Procedure Laterality Date  . ABDOMINAL AORTOGRAM W/LOWER EXTREMITY N/A 10/27/2016   Procedure: Abdominal Aortogram w/Lower Extremity;  Surgeon: Nelva Bush, MD;  Location: [facility];  Service: Cardiovascular;  Laterality: N/A;  . AMPUTATION TOE Left 10/21/2015   Procedure: AMPUTATION TOE;  Surgeon: Sharlotte Alamo, DPM;  Location: [facility];  Service: Podiatry;  Laterality: Left;  . AMPUTATION TOE Left 05/15/2017   Procedure: AMPUTATION LEFT GREAT TOE;  Surgeon: Sharlotte Alamo, DPM;  Location: [facility];  Service: Podiatry;  Laterality: Left;  . AORTIC VALVE REPLACEMENT (AVR)/CORONARY ARTERY BYPASS GRAFTING (CABG)    . BREAST BIOPSY    . CARDIAC CATHETERIZATION  06/2014   95% stenosis  mLAD, occlusion ostial OM1, 70% stenosis LCx, 95% stenosis mRCA, EF 45%.  . CORONARY ARTERY BYPASS GRAFT N/A 06/08/2014   Procedure: CORONARY ARTERY BYPASS GRAFTING (CABG);  Surgeon: Grace Isaac, MD;  Location: [facility];  Service: Open Heart Surgery;  Laterality: N/A;  Times 4 using left internal mammary artery to LAD artery and endoscopically harvested bilateral saphenous vein to Obtuse Marginal, Diagonal and Posterior Descending coronary arteries.  . CT ABD W & PELVIS WO CM  06/2014   nl liver, gallbladder, spleen, mild diverticular changes, no bowel wall inflammation, appendix nl, no hernia, no other sig abnormalities  . LOWER EXTREMITY ANGIOGRAPHY Left 05/23/2017   Procedure: LOWER EXTREMITY ANGIOGRAPHY;  Surgeon: Algernon Huxley, MD;  Location: [facility];  Service: Cardiovascular;  Laterality: Left;  . LOWER EXTREMITY ANGIOGRAPHY Left 05/28/2017   Procedure: LOWER EXTREMITY ANGIOGRAPHY;  Surgeon: Algernon Huxley, MD;  Location: [facility];  Service: Cardiovascular;  Laterality: Left;  . TEE WITHOUT CARDIOVERSION N/A 06/08/2014   Procedure: TRANSESOPHAGEAL ECHOCARDIOGRAM (TEE);  Surgeon: Grace Isaac, MD;  Location: [facility];  Service: Open Heart Surgery;  Laterality: N/A;  . TOE AMPUTATION Right 12/2011   rt middle toe  . US ECHOCARDIOGRAPHY  06/2014   EF 50-55%, HK of inf/post/inferolat walls, Ao sclerosis    Allergies  No Known Allergies  History of  Present Illness    63 y/o ? with a h/o CAD s/p CABG x 4 (2016), HTN, HL, IDDM, tob abuse, PAD, obesity, anxiety, and ICM/HFrEF.  She is s/p CABG x 4 in 06/2014 following NSTEMI.  More recently, she underwent Left SFA stenting and PTA of the L popliteal artery in 2018.  She is due for repeat ABI's.  She was last seen in clinic 1 year ago.  She says that since then, she has done reasonably well.  She has chronic RLE claudication after walking about 25-50 yards, though, she has been having back pain over the past few mos, and that  serves as a bigger limiting factor to ambulation than claudication at this point.  She sometimes notes left lower ext pain, esp if lying on her left side, but no significant claudication.  She continues to smoke and though she had considered chantix previously, she is not currently considering this. She denies chest pain, palpitations, dyspnea, pnd, orthopnea, n, v, dizziness, syncope, edema, weight gain, or early satiety.   Home Medications    Prior to Admission medications   Medication Sig Start Date End Date Taking? Authorizing Provider  aspirin 81 MG EC tablet Take 1 tablet (81 mg total) by mouth daily. 05/17/17   Loletha Grayer, MD  atorvastatin (LIPITOR) 80 MG tablet TAKE 1 TABLET EVERY DAY  AT  6  PM 04/16/18   Minna Merritts, MD  clopidogrel (PLAVIX) 75 MG tablet Take 1 tablet (75 mg total) by mouth daily. 05/17/17   Loletha Grayer, MD  FLUoxetine (PROZAC) 10 MG capsule Take 1 capsule (10 mg total) by mouth daily. 03/21/18   McNew, Tyson Babinski, MD  furosemide (LASIX) 20 MG tablet Take 1 tablet (20 mg total) by mouth daily as needed for fluid. 03/21/18 03/21/19  McNew, Tyson Babinski, MD  gabapentin (NEURONTIN) 100 MG capsule Take 5 capsules (500 mg total) by mouth 3 (three) times daily. 03/21/18   McNew, Tyson Babinski, MD  insulin aspart (NOVOLOG) 100 UNIT/ML injection Inject 3 Units into the skin 3 (three) times daily with meals. 03/21/18 04/20/18  McNew, Tyson Babinski, MD  insulin glargine (LANTUS) 100 UNIT/ML injection Inject 0.6 mLs (60 Units total) into the skin 2 (two) times daily. 03/21/18   McNew, Tyson Babinski, MD  lisinopril (PRINIVIL,ZESTRIL) 40 MG tablet TAKE 1 TABLET EVERY DAY 04/16/18   Minna Merritts, MD  lisinopril (PRINIVIL,ZESTRIL) 5 MG tablet Take 1 tablet (5 mg total) by mouth daily. 03/21/18   McNew, Tyson Babinski, MD  metoprolol tartrate (LOPRESSOR) 25 MG tablet Take 0.5 tablets (12.5 mg total) by mouth 2 (two) times daily. (BETA BLOCKER) 04/16/18   Gollan, Kathlene November, MD  naproxen (NAPROSYN) 500  MG tablet Take 500 mg by mouth 2 (two) times daily. 02/13/18   [provider]  oxybutynin (DITROPAN) 5 MG tablet Take 5 mg by mouth 2 (two) times daily.  05/10/17   [provider]  potassium chloride SA (K-DUR,KLOR-CON) 20 MEQ tablet Take 1 tablet (20 mEq total) by mouth daily as needed (with Lasix). 03/21/18 03/21/19  McNew, Tyson Babinski, MD    Review of Systems    RLE claudication as outline above, along w/ back pain.  She denies chest pain, palpitations, dyspnea, pnd, orthopnea, n, v, dizziness, syncope, edema, weight gain, or early satiety.  All other systems reviewed and are otherwise negative except as noted above.  Physical Exam    VS:  BP 137/77 (BP Location: Left Arm, Patient Position: Sitting, Cuff Size:  Normal)   Pulse 91   Ht 5' 8.5" (1.74 m)   Wt 288 lb 8 oz (130.9 kg)   BMI 43.23 kg/m  , BMI Body mass index is 43.23 kg/m. GEN: Well nourished, well developed, in no acute distress. HEENT: normal. Neck: Supple, no JVD, carotid bruits, or masses. Cardiac: RRR, no murmurs, rubs, or gallops. No clubbing, cyanosis, edema.  Radials 2+ and equal bilaterally.  Weak L PT, I do not appreciate a R PT. Respiratory:  Respirations regular and unlabored, clear to auscultation bilaterally. GI: Soft, nontender, nondistended, BS + x 4. MS: no deformity or atrophy. Skin: warm and dry, no rash. Neuro:  Strength and sensation are intact. Psych: Normal affect.  Accessory Clinical Findings    ECG personally reviewed by me today - RSR, 91, LAD, lateral TWI, IVCD - no acute changes.  Assessment & Plan    1.  CAD: s/p prior CABG in 2016.  She has done well over the past year w/o chest pain or dyspnea.  She reports compliance w/ meds and remains on asa, statin, plavix, .beeta, and acei.  2.  PAD:  Chronic RLE claudication in the setting of known dzs.  She is due for f/u ABIs and asked if we could order.  She knows that she needs to f/u with vasc surgery and will make an appt when  she has the money for the copay.  She remains on asa, plavix, and statin therapy.  3.  Essential HTN: mildly elevated bp today.  She says that it typically is better than this and she will follow @ home - just has to get new batteries for her cuff.  Cont  blocker and acei.  4.  HL:  LDL 39 in 03/2018.  Cont statin rx.  5.  Tob Abuse:  Cont to smoke.  Can't afford chantix.  Cessation advised.  6.  DMII:  A1c improved in 03/2018  7.9.  She remains on insulin therapy and is followed by primary care.  7.  ICM/HFrEF:  Stable on  blocker and acei.  Euvolemic.  We discussed the importance of daily weights, sodium restriction, medication compliance, and symptom reporting and she verbalizes understanding.   8.  Dispo:  F/u ABI's.  She will arrange for f/u w/ vascular surgery.  F/u with Johnny Bridge, MD in 6 mos or sooner if necessary.  Murray Hodgkins, NP 06/27/2018, 6:03 PM

## 2018-06-27 NOTE — Patient Instructions (Addendum)
Medication Instructions:  No changes If you need a refill on your cardiac medications before your next appointment, please call your pharmacy.   Lab work: None If you have labs (blood work) drawn today and your tests are completely normal, you will receive your results only by: Marland Kitchen MyChart Message (if you have MyChart) OR . A paper copy in the mail If you have any lab test that is abnormal or we need to change your treatment, we will call you to review the results.  Testing/Procedures: Your physician has requested that you have an ankle brachial index (ABI). During this test an ultrasound and blood pressure cuff are used to evaluate the arteries that supply the arms and legs with blood. Allow thirty minutes for this exam. There are no restrictions or special instructions.     Follow-Up: At [facility], you and your health needs are our priority.  As part of our continuing mission to provide you with exceptional heart care, we have created designated Provider Care Teams.  These Care Teams include your primary Cardiologist (physician) and Advanced Practice Providers (APPs -  Physician Assistants and Nurse Practitioners) who all work together to provide you with the care you need, when you need it. You will need a follow up appointment in 1 years.  Please call our office 2 months in advance to schedule this appointment.  You may see Melinda Rogue, MD or one of the following Advanced Practice Providers on your designated Care Team:   Murray Hodgkins, NP Christell Faith, [name] . Marrianne Mood, [name]  Any Other Special Instructions Will Be Listed Below (If Applicable).    Ankle-Brachial Index Test Why am I having this test? The ankle-brachial index (ABI) test is used to diagnose peripheral vascular disease (PVD). PVD is also known as peripheral arterial disease (PAD). PVD is the blocking or hardening of the arteries anywhere within the circulatory system beyond the heart. PVD is caused  by:  Cholesterol deposits in your blood vessels (atherosclerosis). This is the most common cause of this condition.  Irritation and swelling (inflammation) in the blood vessels.  Blood clots in the vessels. Cholesterol deposits cause arteries to narrow. Normal delivery of oxygen to your tissues is affected, causing muscle pain and fatigue. This is called claudication. PVD means that there may also be a buildup of cholesterol:  In your heart. This increases the risk of heart attacks.  In your brain. This increases the risk of strokes. What is being tested? The ankle-brachial index test measures the blood flow in your arms and legs. The blood flow will show if blood vessels in your legs have been narrowed by cholesterol deposits. How do I prepare for this test?  Wear loose clothing.  Do not use any tobacco products, including cigarettes, chewing tobacco, or e-cigarettes, for at least 30 minutes before the test. What happens during the test?  1. You will lie down in a resting position. 2. Your health care provider will use a blood pressure machine and a small ultrasound device (Doppler) to measure the systolic pressures on your upper arms and ankles. Systolic pressure is the pressure inside your arteries when your heart pumps. 3. Systolic pressure measurements will be taken several times, and at several points, on both the ankle and the arm. 4. Your health care provider will divide the highest systolic pressure of the ankle by the highest systolic pressure of the arm. The result is the ankle-brachial pressure ratio, or ABI. Sometimes this test will be repeated after  you have exercised on a treadmill for 5 minutes. You may have leg pain during the exercise portion of the test if you suffer from PVD. If the index number drops after exercise, this may show that PVD is present. How are the results reported? Your test results will be reported as a value that shows the ratio of your ankle pressure  to your arm pressure (ABI ratio). Your health care provider will compare your results to normal ranges that were established after testing a large group of people (reference ranges). Reference ranges may vary among labs and hospitals. For this test, a common reference range is:  ABI ratio of 0.9 to 1.3. What do the results mean? An ABI ratio that is below the reference range is considered abnormal and may indicate PVD in the legs. Talk with your health care provider about what your results mean. Questions to ask your health care provider Ask your health care provider, or the department that is doing the test:  When will my results be ready?  How will I get my results?  What are my treatment options?  What other tests do I need?  What are my next steps? Summary  The ankle-brachial index (ABI) test is used to diagnose peripheral vascular disease (PVD). PVD is also known as peripheral arterial disease (PAD).  The ankle-brachial index test measures the blood flow in your arms and legs.  The highest systolic pressure of the ankle is divided by the highest systolic pressure of the arm. The result is the ABI ratio.  An ABI ratio that is below 0.9 is considered abnormal and may indicate PVD in the legs. This information is not intended to replace advice given to you by your health care provider. Make sure you discuss any questions you have with your health care provider. Document Released: 04/21/2004 Document Revised: 01/09/2017 Document Reviewed: 01/09/2017 Elsevier Interactive Patient Education  [employer].

## 2018-07-01 ENCOUNTER — Ambulatory Visit
Admission: RE | Admit: 2018-07-01 | Discharge: 2018-07-01 | Disposition: A | Discharge status: Home / Self Care | Payer: Medicare HMO | Source: Ambulatory Visit | Attending: Surgery | Admitting: Surgery

## 2018-07-01 ENCOUNTER — Other Ambulatory Visit: Payer: Self-pay

## 2018-07-04 ENCOUNTER — Telehealth: Payer: Self-pay | Admitting: Nurse Practitioner

## 2018-07-04 MED ORDER — VARENICLINE TARTRATE 1 MG PO TABS: 1.0000 mg | ORAL_TABLET | Freq: Two times a day (BID) | ORAL | 0 refills | Status: DC

## 2018-07-04 NOTE — Telephone Encounter (Signed)
Refill submitted and spoke with patient letting her know that this medication is not typically ordered long term and should hopefully have success in 3 months. She verbalized understanding and was appreciative for the call back and refill. She had no further questions at this time.

## 2018-07-04 NOTE — Telephone Encounter (Signed)
Please review for refill of Chantix.

## 2018-07-04 NOTE — Telephone Encounter (Signed)
*  STAT* If patient is at the pharmacy, call can be transferred to refill team.   1. Which medications need to be refilled? (please list name of each medication and dose if known) Chantix  2. Which pharmacy/location (including street and city if local pharmacy) is medication to be sent to? [facility]  3. Do they need a 30 day or 90 day supply? 90  

## 2018-07-15 ENCOUNTER — Ambulatory Visit: Payer: Self-pay | Admitting: Dietician

## 2018-07-24 ENCOUNTER — Ambulatory Visit: Payer: Self-pay | Admitting: Dietician

## 2018-07-30 ENCOUNTER — Other Ambulatory Visit: Payer: Self-pay | Admitting: Cardiovascular Disease

## 2018-08-09 ENCOUNTER — Encounter: Payer: Self-pay | Admitting: Dietician

## 2018-08-09 NOTE — Progress Notes (Signed)
Have not been able to reach Melinda [name] to reschedule her missed appointment from 07/24/18. Will try again after COVID-19 crisis has passed. Sent letter to referring provider.  Received phone call from patient soon after letter was sent; she would like to reschedule her appointment after COVID-19 crisis is over. Will contact her at that time. Sent follow-up letter to referring provider.

## 2018-09-20 ENCOUNTER — Other Ambulatory Visit: Payer: Self-pay | Admitting: Cardiovascular Disease

## 2018-09-24 NOTE — Telephone Encounter (Signed)
Please advise if we should continue refilling this medication.

## 2018-09-26 ENCOUNTER — Telehealth: Payer: Self-pay

## 2018-09-26 MED ORDER — POTASSIUM CHLORIDE CRYS ER 20 MEQ PO TBCR: EXTENDED_RELEASE_TABLET | ORAL | 0 refills | Status: DC

## 2018-09-26 MED ORDER — FUROSEMIDE 20 MG PO TABS: ORAL_TABLET | ORAL | 0 refills | Status: DC

## 2018-09-26 NOTE — Telephone Encounter (Signed)
Requested Prescriptions   Signed Prescriptions Disp Refills  . furosemide (LASIX) 20 MG tablet 90 tablet 0    Sig: TAKE 1 TABLET (20 MG TOTAL) BY MOUTH DAILY AS NEEDED.    Authorizing Provider: Theora Gianotti    Ordering User: NEWCOMER MCCLAIN, BRANDY L  . potassium chloride SA (K-DUR) 20 MEQ tablet 90 tablet 0    Sig: TAKE 1 TABLET (20 MEQ TOTAL) BY MOUTH DAILY AS NEEDED.    Authorizing Provider: Theora Gianotti    Ordering User: Raelene Bott, BRANDY L

## 2018-10-08 ENCOUNTER — Other Ambulatory Visit: Payer: Self-pay | Admitting: Nurse Practitioner

## 2018-10-08 DIAGNOSIS — I739 Peripheral vascular disease, unspecified: Secondary | ICD-10-CM

## 2018-11-11 ENCOUNTER — Other Ambulatory Visit: Payer: Self-pay

## 2018-11-11 ENCOUNTER — Ambulatory Visit (INDEPENDENT_AMBULATORY_CARE_PROVIDER_SITE_OTHER): Payer: Medicare HMO

## 2018-11-11 DIAGNOSIS — I739 Peripheral vascular disease, unspecified: Secondary | ICD-10-CM | POA: Diagnosis not present

## 2018-11-12 ENCOUNTER — Telehealth: Payer: Self-pay

## 2018-11-12 DIAGNOSIS — I739 Peripheral vascular disease, unspecified: Secondary | ICD-10-CM

## 2018-11-12 NOTE — Telephone Encounter (Signed)
Patient made aware of LE study and Ignacia Bayley, NP recommendation. Patient last seen by Dr.Dew Feb 2019. New ref placed with [facility] (Dr.Dew) Adv the pt that they will call directly to schedule. Pt verbalized understanding.

## 2018-11-12 NOTE — Telephone Encounter (Signed)
-----   Message from Theora Gianotti, NP sent at 11/11/2018  2:42 PM EDT ----- This was ordered  Back  In Feb but probably deferred due to Covid.  The right leg is stable however, it appears that she has recurrent disease w/in the left lower extremity stent.  She will need to f/u w/ vascular surgery.  She has previously seen Dr. Lucky Cowboy.

## 2018-12-06 ENCOUNTER — Other Ambulatory Visit: Payer: Self-pay | Admitting: *Deleted

## 2018-12-06 ENCOUNTER — Encounter (INDEPENDENT_AMBULATORY_CARE_PROVIDER_SITE_OTHER): Payer: Self-pay | Admitting: Vascular Surgery

## 2018-12-06 ENCOUNTER — Ambulatory Visit (INDEPENDENT_AMBULATORY_CARE_PROVIDER_SITE_OTHER): Payer: Medicare HMO | Admitting: Vascular Surgery

## 2018-12-06 ENCOUNTER — Other Ambulatory Visit: Payer: Self-pay

## 2018-12-06 VITALS — BP 140/75 | HR 96 | Resp 14 | Ht 68.5 in | Wt 312.0 lb

## 2018-12-06 DIAGNOSIS — Z720 Tobacco use: Secondary | ICD-10-CM | POA: Diagnosis not present

## 2018-12-06 DIAGNOSIS — I7025 Atherosclerosis of native arteries of other extremities with ulceration: Secondary | ICD-10-CM | POA: Diagnosis not present

## 2018-12-06 DIAGNOSIS — I10 Essential (primary) hypertension: Secondary | ICD-10-CM | POA: Diagnosis not present

## 2018-12-06 DIAGNOSIS — F17210 Nicotine dependence, cigarettes, uncomplicated: Secondary | ICD-10-CM

## 2018-12-06 DIAGNOSIS — E1159 Type 2 diabetes mellitus with other circulatory complications: Secondary | ICD-10-CM | POA: Diagnosis not present

## 2018-12-06 DIAGNOSIS — Z794 Long term (current) use of insulin: Secondary | ICD-10-CM

## 2018-12-06 MED ORDER — FUROSEMIDE 20 MG PO TABS: ORAL_TABLET | ORAL | 2 refills | Status: DC

## 2018-12-06 NOTE — Assessment & Plan Note (Signed)
We had a discussion for approximately 3 minutes regarding the absolute need for smoking cessation due to the deleterious nature of tobacco on the vascular system.  She has cut her tobacco use back significantly but has not yet stopped we discussed the tobacco use would diminish patency of any intervention, and likely significantly worsen progressio of disease. We discussed multiple agents for quitting including replacement therapy or medications to reduce cravings such as Chantix.  She is actually on Chantix now.  The patient voices their understanding of the importance of smoking cessation.

## 2018-12-06 NOTE — Assessment & Plan Note (Signed)
The patient has several small superficial ulcerations on both feet.  She has recent noninvasive studies performed by her cardiologist showing a drop in her left ABI down to the 0.7 range with stenosis in her previously placed stent.  Her right ABI is in the 0.5 range which is stable from her last study where it was known to be significantly reduced. This represents a critical limb threatening situation.  I would recommend angiogram of both lower extremities in a staged fashion.  We will plan on performing the left lower extremity first as she has a stent that could thrombose and leave her in an acute limb threatening situation.  At her last interventions, she required anesthesia so we will plan that with her next procedures.  We have discussed the risks and benefits of the procedures and the patient voices her understanding and is agreeable to proceed.

## 2018-12-06 NOTE — Assessment & Plan Note (Signed)
blood pressure control important in reducing the progression of atherosclerotic disease. On appropriate oral medications.  

## 2018-12-06 NOTE — Assessment & Plan Note (Signed)
blood glucose control important in reducing the progression of atherosclerotic disease. Also, involved in wound healing. On appropriate medications.  

## 2018-12-06 NOTE — Progress Notes (Signed)
MRN : [id number]  Melinda [name] is a 63 y.o. (10/16/1955) female who presents with chief complaint of  Chief Complaint  Patient presents with   Follow-up  .  History of Present Illness: Patient returns today in follow up of her peripheral arterial disease.  She was last seen in 2019 and had a left lower extremity intervention performed for anger in his toe.  After digital amputation healed she has done well.  She reports she has cut back on her smoking significantly but has not been able to completely quit.  She is trying Chantix now.  Her legs are really hurting her more over the past several weeks to months.  She has some superficial ulcerations bilaterally.  There is no clear inciting event or causative factor that started her current symptoms.  Both legs are bothering her significantly. She has recent noninvasive studies performed by her cardiologist showing a drop in her left ABI down to the 0.7 range with stenosis in her previously placed stent.  Her right ABI is in the 0.5 range which is stable from her last study where it was known to be significantly reduced.  Current Outpatient Medications  Medication Sig Dispense Refill   aspirin 81 MG EC tablet Take 1 tablet (81 mg total) by mouth daily. 30 tablet 0   atorvastatin (LIPITOR) 80 MG tablet TAKE 1 TABLET EVERY DAY  AT  6  PM 90 tablet 3   clopidogrel (PLAVIX) 75 MG tablet Take 1 tablet (75 mg total) by mouth daily. 30 tablet 0   furosemide (LASIX) 20 MG tablet TAKE 1 TABLET (20 MG TOTAL) BY MOUTH DAILY AS NEEDED. 90 tablet 0   gabapentin (NEURONTIN) 100 MG capsule Take 5 capsules (500 mg total) by mouth 3 (three) times daily. (Patient taking differently: Take 400 mg by mouth 3 (three) times daily. )     insulin aspart (NOVOLOG) 100 UNIT/ML injection Inject 3 Units into the skin 3 (three) times daily with meals. 2.7 mL 0   insulin glargine (LANTUS) 100 UNIT/ML injection Inject 0.6 mLs (60 Units total) into the skin 2 (two)  times daily.     lisinopril (PRINIVIL,ZESTRIL) 5 MG tablet Take 1 tablet (5 mg total) by mouth daily. 30 tablet 0   metoprolol tartrate (LOPRESSOR) 25 MG tablet Take 0.5 tablets (12.5 mg total) by mouth 2 (two) times daily. (BETA BLOCKER) 180 tablet 3   naproxen (NAPROSYN) 500 MG tablet Take 500 mg by mouth 2 (two) times daily.     oxybutynin (DITROPAN) 5 MG tablet Take 5 mg by mouth 2 (two) times daily.      potassium chloride SA (K-DUR) 20 MEQ tablet TAKE 1 TABLET (20 MEQ TOTAL) BY MOUTH DAILY AS NEEDED. 90 tablet 0   tiZANidine (ZANAFLEX) 4 MG tablet Take 4 mg by mouth 3 (three) times daily.     varenicline (CHANTIX CONTINUING MONTH PAK) 1 MG tablet Take 1 tablet (1 mg total) by mouth 2 (two) times daily. 180 tablet 0   FLUoxetine (PROZAC) 10 MG capsule Take 1 capsule (10 mg total) by mouth daily. (Patient not taking: Reported on 12/06/2018) 30 capsule 0   No current facility-administered medications for this visit.     Past Medical History:  Diagnosis Date   Anxiety    Coronary artery disease    a. 06/2014 NSTEMI s/p CABG x 4 (LIMA to LAD, VG to Diag, VG to OM, VG to PDA).   Diabetic neuropathy ([place])    GERD (  gastroesophageal reflux disease)    HTN (hypertension)    Hyperlipidemia LDL goal <70    IDDM (insulin dependent diabetes mellitus) ([place])    Ischemic cardiomyopathy    a. 06/2014 Echo: EF 40-45%, HK of entire inferolateral and inferior myocardium c/w infarct of RCA/LCx, GR2DD, mild MR   OA (osteoarthritis) of knee    Obesity    Osteoarthritis    Osteomyelitis ([place])    Peripheral neuropathy    Peripheral vascular disease ([place])    a. 09/2016 Periph Angio: CTO R popliteal, CTO L prox/mid SFA->Med Rx; b. 05/2017 PTA: LSFA (Viabahn covered stent x 2), DEB to L Post Tibial; c. 06/2017 ABI: R 0.44, L 1.05.   Tobacco abuse     Past Surgical History:  Procedure Laterality Date   ABDOMINAL AORTOGRAM W/LOWER EXTREMITY N/A 10/27/2016   Procedure: Abdominal Aortogram  w/Lower Extremity;  Surgeon: Nelva Bush, MD;  Location: [facility];  Service: Cardiovascular;  Laterality: N/A;   AMPUTATION TOE Left 10/21/2015   Procedure: AMPUTATION TOE;  Surgeon: Sharlotte Alamo, DPM;  Location: [facility];  Service: Podiatry;  Laterality: Left;   AMPUTATION TOE Left 05/15/2017   Procedure: AMPUTATION LEFT GREAT TOE;  Surgeon: Sharlotte Alamo, DPM;  Location: [facility];  Service: Podiatry;  Laterality: Left;   AORTIC VALVE REPLACEMENT (AVR)/CORONARY ARTERY BYPASS GRAFTING (CABG)     BREAST BIOPSY     CARDIAC CATHETERIZATION  06/2014   95% stenosis mLAD, occlusion ostial OM1, 70% stenosis LCx, 95% stenosis mRCA, EF 45%.   CORONARY ARTERY BYPASS GRAFT N/A 06/08/2014   Procedure: CORONARY ARTERY BYPASS GRAFTING (CABG);  Surgeon: Grace Isaac, MD;  Location: [place];  Service: Open Heart Surgery;  Laterality: N/A;  Times 4 using left internal mammary artery to LAD artery and endoscopically harvested bilateral saphenous vein to Obtuse Marginal, Diagonal and Posterior Descending coronary arteries.   CT ABD W & PELVIS WO CM  06/2014   nl liver, gallbladder, spleen, mild diverticular changes, no bowel wall inflammation, appendix nl, no hernia, no other sig abnormalities   LOWER EXTREMITY ANGIOGRAPHY Left 05/23/2017   Procedure: LOWER EXTREMITY ANGIOGRAPHY;  Surgeon: Algernon Huxley, MD;  Location: [place];  Service: Cardiovascular;  Laterality: Left;   LOWER EXTREMITY ANGIOGRAPHY Left 05/28/2017   Procedure: LOWER EXTREMITY ANGIOGRAPHY;  Surgeon: Algernon Huxley, MD;  Location: [place] [facility];  Service: Cardiovascular;  Laterality: Left;   TEE WITHOUT CARDIOVERSION N/A 06/08/2014   Procedure: TRANSESOPHAGEAL ECHOCARDIOGRAM (TEE);  Surgeon: Grace Isaac, MD;  Location: [facility];  Service: Open Heart Surgery;  Laterality: N/A;   TOE AMPUTATION Right 12/2011   rt middle toe   US ECHOCARDIOGRAPHY  06/2014   EF 50-55%, HK of inf/post/inferolat walls, Ao sclerosis     Social History Social History   Tobacco Use   Smoking status: Current Some Day Smoker    Packs/day: 0.00    Years: 40.00    Pack years: 0.00    Types: Cigarettes   Smokeless tobacco: Never Used   Tobacco comment: 1 every 3-4 days  Substance Use Topics   Alcohol use: No    Alcohol/week: 0.0 standard drinks   Drug use: Not Currently    Comment: crack    Family History Family History  Problem Relation Age of Onset   CAD Mother    Hypertension Mother    Diabetes Mother    CAD Father    Diabetes Father    Sickle cell trait Other  Asthma Other     No Known Allergies   REVIEW OF SYSTEMS (Negative unless checked)  Constitutional: [] ?Weight loss  [] ?Fever  [] ?Chills Cardiac: [] ?Chest pain   [] ?Chest pressure   [] ?Palpitations   [] ?Shortness of breath when laying flat   [] ?Shortness of breath at rest   [] ?Shortness of breath with exertion. Vascular:  [] ?Pain in legs with walking   [] ?Pain in legs at rest   [] ?Pain in legs when laying flat   [x] ?Claudication   [] ?Pain in feet when walking  [] ?Pain in feet at rest  [] ?Pain in feet when laying flat   [] ?History of DVT   [] ?Phlebitis   [] ?Swelling in legs   [] ?Varicose veins   [x] ?Non-healing ulcers Pulmonary:   [] ?Uses home oxygen   [] ?Productive cough   [] ?Hemoptysis   [] ?Wheeze  [] ?COPD   [] ?Asthma Neurologic:  [] ?Dizziness  [] ?Blackouts   [] ?Seizures   [] ?History of stroke   [] ?History of TIA  [] ?Aphasia   [] ?Temporary blindness   [] ?Dysphagia   [] ?Weakness or numbness in arms   [] ?Weakness or numbness in legs Musculoskeletal:  [] ?Arthritis   [] ?Joint swelling   [] ?Joint pain   [] ?Low back pain Hematologic:  [] ?Easy bruising  [] ?Easy bleeding   [] ?Hypercoagulable state   [] ?Anemic   Gastrointestinal:  [] ?Blood in stool   [] ?Vomiting blood  [] ?Gastroesophageal reflux/heartburn   [] ?Abdominal pain Genitourinary:  [] ?Chronic kidney disease   [] ?Difficult urination  [] ?Frequent urination  [] ?Burning with urination    [] ?Hematuria Skin:  [] ?Rashes   [x] ?Ulcers   [x] ?Wounds Psychological:  [] ?History of anxiety   [] ? History of major depression.  Physical Examination  BP 140/75 (BP Location: Left Arm, Patient Position: Sitting, Cuff Size: Large)    Pulse 96    Resp 14    Ht 5' 8.5" (1.74 m)    Wt (!) 312 lb (141.5 kg)    BMI 46.75 kg/m  Gen:  WD/WN, NAD Head: [place]/AT, No temporalis wasting. Ear/Nose/Throat: Hearing grossly intact, nares w/o erythema or drainage Eyes: Conjunctiva clear. Sclera non-icteric Neck: Supple.  Trachea midline Pulmonary:  Good air movement, no use of accessory muscles.  Cardiac: RRR, no JVD Vascular:  Vessel Right Left  Radial Palpable Palpable                          PT  not palpable  trace palpable  DP  trace palpable  1+ palpable    Musculoskeletal: M/S 5/5 throughout.  No deformity or atrophy.  Mild bilateral lower extremity edema. Neurologic: Sensation grossly intact in extremities.  Symmetrical.  Speech is fluent.  Psychiatric: Judgment intact, Mood & affect appropriate for pt's clinical situation. Dermatologic: Several small superficial scabs on her toes and feet bilaterally       Labs No results found for this or any previous visit (from the past 2160 hour(s)).  Radiology Vas [name] Abi With/wo Tbi  Result Date: 11/12/2018 LOWER EXTREMITY DOPPLER STUDY Indications: Claudication, peripheral artery disease, and Patient has chronic              RLE claudication after walking about 25-50 yards, though, she has              been having back pain over the past few months. She sometimes notes              left lower ext pain, especially if lying on her left side. High Risk Factors: Hypertension, hyperlipidemia, Diabetes, current smoker,  coronary artery disease. Other Factors: CABG x 4.  Vascular Interventions: Left profunda femoral artery PTA on 05/23/17; Left SFA                         stent x2 with PTAs of left proximal popliteal &                          posterior tibial arteries. 05/15/17-Left great toe                         amputation. Limitations: Body habitus; patients inability to tolerate exam Comparison Study: In 06/2017, an arterial Doppler showed a right ABI of 0.40 and                   left 1.05 Performing Technologist: Wilkie Aye RVT  Examination Guidelines: A complete evaluation includes at minimum, Doppler waveform signals and systolic blood pressure reading at the level of bilateral brachial, anterior tibial, and posterior tibial arteries, when vessel segments are accessible. Bilateral testing is considered an integral part of a complete examination. Photoelectric Plethysmograph (PPG) waveforms and toe systolic pressure readings are included as required and additional duplex testing as needed. Limited examinations for reoccurring indications may be performed as noted.  ABI Findings: +---------+------------------+-----+----------+--------+  Right     Rt Pressure (mmHg) Index Waveform   Comment   +---------+------------------+-----+----------+--------+  Brachial  129                                           +---------+------------------+-----+----------+--------+  ATA       70                 0.50  monophasic           +---------+------------------+-----+----------+--------+  PTA       79                 0.56  monophasic           +---------+------------------+-----+----------+--------+  PERO      67                 0.48  monophasic           +---------+------------------+-----+----------+--------+  Great Toe 44                 0.31  Abnormal             +---------+------------------+-----+----------+--------+ +---------+------------------+-----+----------+----------+  Left      Lt Pressure (mmHg) Index Waveform   Comment     +---------+------------------+-----+----------+----------+  Brachial  140                                             +---------+------------------+-----+----------+----------+  ATA       109                0.78   monophasic             +---------+------------------+-----+----------+----------+  PTA       116                0.83  monophasic             +---------+------------------+-----+----------+----------+  PERO      101                0.72  monophasic             +---------+------------------+-----+----------+----------+  Great Toe                                     amputation  +---------+------------------+-----+----------+----------+ +-------+-----------+-----------+------------+------------+  ABI/TBI Today's ABI Today's TBI Previous ABI Previous TBI  +-------+-----------+-----------+------------+------------+  Right   0.56        0.31        0.40         0.50          +-------+-----------+-----------+------------+------------+  Left    0.73                    1.05                       +-------+-----------+-----------+------------+------------+  Left ABIs appear decreased compared to prior study on 06/2017. Right ABIs appear essentially unchanged compared to prior study on 06/2017.  Summary: Right: Resting right ankle-brachial index indicates moderate right lower extremity arterial disease. The right toe-brachial index is abnormal. Left: Resting left ankle-brachial index indicates moderate left lower extremity arterial disease.  *See table(s) above for measurements and observations.  Vascular consult recommended. Electronically signed by Larae Grooms MD on 11/12/2018 at 12:13:06 PM.    Final    Vas [name] Lower Extremity Arterial Duplex  Result Date: 11/12/2018 LOWER EXTREMITY ARTERIAL DUPLEX STUDY Indications: Patient has chronic RLE claudication after walking about 25-50              yards, though, she has been having back pain over the past few              months. She sometimes notes left lower ext pain, especially if              lying on her left side. Other Factors: CABG x 4;.  Vascular Interventions: Left profunda femoral artery PTA on 05/23/17; Left SFA                         stent x2 with PTAs of left  proximal popliteal &                         posterior tibial arteries. 05/15/17-Left great toe                         amputation. Current ABI:            Today the right ABI was 0.56 and left 0.83 Comparison Study: Angiogram on 05/2017 showed a chronic total occlusion of the                   right popliteal artery                   Arterial duplex on 09/2016 showed an occluded left proximal                   SFA. Performing Technologist: Wilkie Aye RVT  Examination Guidelines: A complete evaluation includes B-mode imaging, spectral Doppler, color Doppler, and power Doppler as needed of all accessible portions of each vessel. Bilateral testing is considered an integral  part of a complete examination. Limited examinations for reoccurring indications may be performed as noted.  +----------+--------+--------+----------+--------+  LEFT       PSV cm/s Stenosis Waveform   Comments  +----------+--------+--------+----------+--------+  CFA Prox   116               biphasic             +----------+--------+--------+----------+--------+  DFA        82                biphasic             +----------+--------+--------+----------+--------+  POP Prox   113               monophasic           +----------+--------+--------+----------+--------+  POP Distal 50                monophasic           +----------+--------+--------+----------+--------+  TP Trunk   68                monophasic           +----------+--------+--------+----------+--------+  Left Stent(s): +---------------+--------+---------------+----------+--------+  SFA             PSV cm/s Stenosis        Waveform   Comments  +---------------+--------+---------------+----------+--------+  Prox to Stent   82                       biphasic             +---------------+--------+---------------+----------+--------+  Proximal Stent  100                      biphasic             +---------------+--------+---------------+----------+--------+  Mid Stent       60                        biphasic             +---------------+--------+---------------+----------+--------+  Distal Stent    404      50-99% stenosis                      +---------------+--------+---------------+----------+--------+  Distal to Stent 70                       monophasic           +---------------+--------+---------------+----------+--------+  Summary: Left: Stenosis is noted within the Distal SFA stent. Technically challenging study due to body habitus and patients inability to tolerate exam.  See table(s) above for measurements and observations. Vascular consult recommended. Electronically signed by Larae Grooms MD on 11/12/2018 at 12:20:05 PM.    Final     Assessment/Plan  HTN (hypertension) blood pressure control important in reducing the progression of atherosclerotic disease. On appropriate oral medications.   DM2 (diabetes mellitus, type 2) blood glucose control important in reducing the progression of atherosclerotic disease. Also, involved in wound healing. On appropriate medications.   Tobacco abuse We had a discussion for approximately 3 minutes regarding the absolute need for smoking cessation due to the deleterious nature of tobacco on the vascular system.  She has cut her tobacco use back significantly but has not yet stopped we discussed the tobacco use would diminish patency of any intervention, and likely significantly worsen  progressio of disease. We discussed multiple agents for quitting including replacement therapy or medications to reduce cravings such as Chantix.  She is actually on Chantix now.  The patient voices their understanding of the importance of smoking cessation.   Atherosclerosis of native arteries of the extremities with ulceration ([place]) The patient has several small superficial ulcerations on both feet.  She has recent noninvasive studies performed by her cardiologist showing a drop in her left ABI down to the 0.7 range with stenosis in her previously placed stent.   Her right ABI is in the 0.5 range which is stable from her last study where it was known to be significantly reduced. This represents a critical limb threatening situation.  I would recommend angiogram of both lower extremities in a staged fashion.  We will plan on performing the left lower extremity first as she has a stent that could thrombose and leave her in an acute limb threatening situation.  At her last interventions, she required anesthesia so we will plan that with her next procedures.  We have discussed the risks and benefits of the procedures and the patient voices her understanding and is agreeable to proceed.    [name], MD  12/06/2018 12:32 PM    This note was created with Dragon medical transcription system.  Any errors from dictation are purely unintentional

## 2018-12-06 NOTE — Patient Instructions (Signed)
Angiogram  An angiogram is a procedure used to examine the blood vessels. In this procedure, contrast dye is injected through a long, thin tube (catheter) into an artery. X-rays are then taken, which show if there is a blockage or problem in a blood vessel. The catheter may be inserted in:  Your groin area. This is the most common.  The fold of your arm, near your elbow.  Your wrist. Tell a health care provider about:  Any allergies you have, including allergies to shellfish or contrast dye.  All medicines you are taking, including vitamins, herbs, eye drops, creams, and over-the-counter medicines.  Any problems you or family members have had with anesthetic medicines.  Any blood disorders you have.  Any surgeries you have had.  Any previous kidney problems or failure you have had.  Any medical conditions you have.  Whether you are pregnant or may be pregnant.  Whether you are breastfeeding. What are the risks? Generally, this is a safe procedure. However, problems may occur, including:  Infection or bruising at the catheter area.  Damage to other structures or organs, including rupture of blood vessels or damage to arteries.  Allergic reaction to the contrast dye used.  Kidney damage from the contrast dye used.  Blood clots that can lead to a stroke or heart attack. What happens before the procedure? Staying hydrated Follow instructions from your health care provider about hydration, which may include:  Up to 2 hours before the procedure - you may continue to drink clear liquids, such as water, clear fruit juice, black coffee, and plain tea. Eating and drinking restrictions Follow instructions from your health care provider about eating and drinking, which may include:  8 hours before the procedure - stop eating heavy meals or foods such as meat, fried foods, or fatty foods.  6 hours before the procedure - stop eating light meals or foods, such as toast or cereal.   6 hours before the procedure - stop drinking milk or drinks that contain milk.  2 hours before the procedure - stop drinking clear liquids. General instructions  Ask your health care provider about: ? Changing or stopping your normal medicines. This is important if you take diabetes medicines or blood thinners. ? Taking medicines such as aspirin and ibuprofen. These medicines can thin your blood. Do not take these medicines before your procedure if your doctor tells you not to.  You may have blood samples taken.  Plan to have someone take you home from the hospital or clinic.  If you will be going home right after the procedure, plan to have someone with you for 24 hours. What happens during the procedure?  To reduce your risk of infection: ? Your health care team will wash or sanitize their hands. ? Your skin will be washed with soap. ? Hair may be removed from the insertion area.  You will lie on your back on an X-ray table. You may be strapped to the table if it is tilted.  An IV tube will be inserted into one of your veins.  Electrodes may be placed on your chest to monitor your heart rate during the procedure.  You will be given one or more of the following: ? A medicine to help you relax (sedative). ? A medicine to numb the area where the catheter will be inserted (local anesthetic).  The catheter will be inserted into an artery using a guide wire. A type of X-ray (fluoroscopy) will be used to help   guide the catheter to the blood vessel to be examined.  A contrast dye will then be injected into the catheter, and X-rays will be taken. The contrast will help to show where any narrowing or blockages are located in the blood vessels. You may feel flushed as the contrast dye is injected.  After the X-ray is complete, the catheter will be removed.  A bandage (dressing) will be placed over the site where the catheter was inserted. Pressure will be applied to help stop any  bleeding. The procedure may vary among health care providers and hospitals. What happens after the procedure?  Your blood pressure, heart rate, breathing rate, and blood oxygen level will be monitored until the medicines you were given have worn off.  You will be kept in bed lying flat for several hours. If the catheter was inserted through your leg, you will be instructed not to bend or cross your legs.  The insertion area and the pulse in your feet or wrist will be checked frequently.  You will be instructed to drink plenty of fluids. This will help wash the contrast dye out of your body.  Additional blood tests and X-rays may be done.  Tests to check the electrical activity in your heart (electrocardiogram) may be done.  Do not drive for 24 hours if you received a sedative.  It is up to you to get the results of your procedure. Ask your health care provider, or the department that is doing the procedure, when your results will be ready. Summary  An angiogram is a procedure used to examine the blood vessels.  In this procedure, contrast dye is injected through a long, thin tube (catheter) into an artery. X-rays are then taken.  Before the procedure, follow your health care provider's instructions about eating and drinking restrictions. You may be asked to stop eating and drinking several hours before the procedure.  After the procedure, you will need to lie flat for several hours and drink plenty of fluids. This information is not intended to replace advice given to you by your health care provider. Make sure you discuss any questions you have with your health care provider. Document Released: 01/25/2005 Document Revised: 03/30/2017 Document Reviewed: 05/24/2016 Elsevier Patient Education  2020 [employer].

## 2018-12-09 ENCOUNTER — Other Ambulatory Visit: Payer: Self-pay | Admitting: *Deleted

## 2018-12-09 MED ORDER — POTASSIUM CHLORIDE CRYS ER 20 MEQ PO TBCR: EXTENDED_RELEASE_TABLET | ORAL | 0 refills | Status: DC

## 2018-12-10 ENCOUNTER — Telehealth (INDEPENDENT_AMBULATORY_CARE_PROVIDER_SITE_OTHER): Payer: Self-pay

## 2018-12-10 NOTE — Telephone Encounter (Signed)
Spoke with the patient and she is scheduled with Dr. Lucky Cowboy for left leg angio on 12/16/2018 with 9:15 am arrival time to the [facility]. Patient will do her Covid test on 12/12/2018 between 12:30-2:30 pm at the [facility]. Patient is also scheduled for right leg angio procedure with Dr. Lucky Cowboy on 12/23/2018 with a 8:30 am arrival time to the [facility] and Covid testing on 12/19/2018 between 12:30-2:30 pm at the [facility]. Anesthesia has been acquired for both procedures through [facility] and I spoke with Wells Guiles as well.

## 2018-12-12 ENCOUNTER — Other Ambulatory Visit: Payer: Self-pay

## 2018-12-12 ENCOUNTER — Other Ambulatory Visit
Admission: RE | Admit: 2018-12-12 | Discharge: 2018-12-12 | Disposition: A | Discharge status: Home / Self Care | Payer: Medicare HMO | Source: Ambulatory Visit | Attending: Vascular Surgery | Admitting: Vascular Surgery

## 2018-12-12 DIAGNOSIS — Z01812 Encounter for preprocedural laboratory examination: Secondary | ICD-10-CM | POA: Insufficient documentation

## 2018-12-12 DIAGNOSIS — Z20828 Contact with and (suspected) exposure to other viral communicable diseases: Secondary | ICD-10-CM | POA: Diagnosis not present

## 2018-12-13 ENCOUNTER — Other Ambulatory Visit (INDEPENDENT_AMBULATORY_CARE_PROVIDER_SITE_OTHER): Payer: Self-pay | Admitting: Nurse Practitioner

## 2018-12-13 LAB — SARS CORONAVIRUS 2 (TAT 6-24 HRS): SARS Coronavirus 2: NEGATIVE

## 2018-12-15 MED ORDER — CEFAZOLIN SODIUM-DEXTROSE 2-4 GM/100ML-% IV SOLN
2.0000 g | Freq: Once | INTRAVENOUS | Status: AC
  Administered 2018-12-16: 2 g via INTRAVENOUS

## 2018-12-16 ENCOUNTER — Encounter
Admission: RE | Disposition: A | Discharge status: Home / Self Care | Payer: Self-pay | Source: Home / Self Care | Attending: Vascular Surgery

## 2018-12-16 ENCOUNTER — Ambulatory Visit
Admission: RE | Admit: 2018-12-16 | Discharge: 2018-12-16 | Disposition: A | Discharge status: Home / Self Care | Payer: Medicare HMO | Attending: Vascular Surgery | Admitting: Vascular Surgery

## 2018-12-16 ENCOUNTER — Ambulatory Visit: Payer: Medicare HMO | Admitting: Certified Registered"

## 2018-12-16 ENCOUNTER — Encounter: Payer: Self-pay | Admitting: Anesthesiology

## 2018-12-16 ENCOUNTER — Other Ambulatory Visit: Payer: Self-pay

## 2018-12-16 DIAGNOSIS — E785 Hyperlipidemia, unspecified: Secondary | ICD-10-CM | POA: Insufficient documentation

## 2018-12-16 DIAGNOSIS — Z7902 Long term (current) use of antithrombotics/antiplatelets: Secondary | ICD-10-CM | POA: Insufficient documentation

## 2018-12-16 DIAGNOSIS — L97519 Non-pressure chronic ulcer of other part of right foot with unspecified severity: Secondary | ICD-10-CM | POA: Insufficient documentation

## 2018-12-16 DIAGNOSIS — Z7982 Long term (current) use of aspirin: Secondary | ICD-10-CM | POA: Diagnosis not present

## 2018-12-16 DIAGNOSIS — Z794 Long term (current) use of insulin: Secondary | ICD-10-CM | POA: Diagnosis not present

## 2018-12-16 DIAGNOSIS — Z951 Presence of aortocoronary bypass graft: Secondary | ICD-10-CM | POA: Insufficient documentation

## 2018-12-16 DIAGNOSIS — Z6841 Body Mass Index (BMI) 40.0 and over, adult: Secondary | ICD-10-CM | POA: Diagnosis not present

## 2018-12-16 DIAGNOSIS — Z833 Family history of diabetes mellitus: Secondary | ICD-10-CM | POA: Insufficient documentation

## 2018-12-16 DIAGNOSIS — Z89412 Acquired absence of left great toe: Secondary | ICD-10-CM | POA: Insufficient documentation

## 2018-12-16 DIAGNOSIS — E11621 Type 2 diabetes mellitus with foot ulcer: Secondary | ICD-10-CM | POA: Insufficient documentation

## 2018-12-16 DIAGNOSIS — Z89421 Acquired absence of other right toe(s): Secondary | ICD-10-CM | POA: Insufficient documentation

## 2018-12-16 DIAGNOSIS — E1142 Type 2 diabetes mellitus with diabetic polyneuropathy: Secondary | ICD-10-CM | POA: Insufficient documentation

## 2018-12-16 DIAGNOSIS — I428 Other cardiomyopathies: Secondary | ICD-10-CM | POA: Diagnosis not present

## 2018-12-16 DIAGNOSIS — L97529 Non-pressure chronic ulcer of other part of left foot with unspecified severity: Secondary | ICD-10-CM | POA: Insufficient documentation

## 2018-12-16 DIAGNOSIS — I252 Old myocardial infarction: Secondary | ICD-10-CM | POA: Diagnosis not present

## 2018-12-16 DIAGNOSIS — F17210 Nicotine dependence, cigarettes, uncomplicated: Secondary | ICD-10-CM | POA: Insufficient documentation

## 2018-12-16 DIAGNOSIS — M1990 Unspecified osteoarthritis, unspecified site: Secondary | ICD-10-CM | POA: Diagnosis not present

## 2018-12-16 DIAGNOSIS — Z79899 Other long term (current) drug therapy: Secondary | ICD-10-CM | POA: Diagnosis not present

## 2018-12-16 DIAGNOSIS — Z8249 Family history of ischemic heart disease and other diseases of the circulatory system: Secondary | ICD-10-CM | POA: Insufficient documentation

## 2018-12-16 DIAGNOSIS — I70245 Atherosclerosis of native arteries of left leg with ulceration of other part of foot: Secondary | ICD-10-CM

## 2018-12-16 DIAGNOSIS — L97909 Non-pressure chronic ulcer of unspecified part of unspecified lower leg with unspecified severity: Secondary | ICD-10-CM

## 2018-12-16 DIAGNOSIS — K219 Gastro-esophageal reflux disease without esophagitis: Secondary | ICD-10-CM | POA: Insufficient documentation

## 2018-12-16 DIAGNOSIS — I70235 Atherosclerosis of native arteries of right leg with ulceration of other part of foot: Secondary | ICD-10-CM | POA: Insufficient documentation

## 2018-12-16 DIAGNOSIS — E669 Obesity, unspecified: Secondary | ICD-10-CM | POA: Diagnosis not present

## 2018-12-16 DIAGNOSIS — I70299 Other atherosclerosis of native arteries of extremities, unspecified extremity: Secondary | ICD-10-CM

## 2018-12-16 HISTORY — PX: LOWER EXTREMITY ANGIOGRAPHY: CATH118251

## 2018-12-16 LAB — URINE DRUG SCREEN, QUALITATIVE (ARMC ONLY)
Amphetamines, Ur Screen: NOT DETECTED
Barbiturates, Ur Screen: NOT DETECTED
Benzodiazepine, Ur Scrn: NOT DETECTED
Cannabinoid 50 Ng, Ur ~~LOC~~: NOT DETECTED
Cocaine Metabolite,Ur ~~LOC~~: NOT DETECTED
MDMA (Ecstasy)Ur Screen: NOT DETECTED
Methadone Scn, Ur: NOT DETECTED
Opiate, Ur Screen: NOT DETECTED
Phencyclidine (PCP) Ur S: NOT DETECTED
Tricyclic, Ur Screen: NOT DETECTED

## 2018-12-16 LAB — CREATININE, SERUM
Creatinine, Ser: 1.34 mg/dL — ABNORMAL HIGH (ref 0.44–1.00)
GFR calc Af Amer: 48 mL/min — ABNORMAL LOW (ref >60)
GFR calc non Af Amer: 41 mL/min — ABNORMAL LOW (ref >60)

## 2018-12-16 LAB — GLUCOSE, CAPILLARY
Glucose-Capillary: 91 mg/dL (ref 70–99)
Glucose-Capillary: 83 mg/dL (ref 70–99)

## 2018-12-16 LAB — BUN: BUN: 25 mg/dL — ABNORMAL HIGH (ref 8–23)

## 2018-12-16 SURGERY — LOWER EXTREMITY ANGIOGRAPHY
Anesthesia: General | Laterality: Left

## 2018-12-16 MED ORDER — SODIUM CHLORIDE 0.9 % IV SOLN: INTRAVENOUS | Status: DC

## 2018-12-16 MED ORDER — MIDAZOLAM HCL 2 MG/2ML IJ SOLN
INTRAMUSCULAR | Status: DC | PRN
  Administered 2018-12-16: 2 mg via INTRAVENOUS

## 2018-12-16 MED ORDER — FENTANYL CITRATE (PF) 100 MCG/2ML IJ SOLN: 25.0000 ug | INTRAMUSCULAR | Status: DC | PRN

## 2018-12-16 MED ORDER — SODIUM CHLORIDE 0.9% FLUSH: 3.0000 mL | INTRAVENOUS | Status: DC | PRN

## 2018-12-16 MED ORDER — FENTANYL CITRATE (PF) 100 MCG/2ML IJ SOLN
INTRAMUSCULAR | Status: DC | PRN
  Administered 2018-12-16 (×2): 50 ug via INTRAVENOUS

## 2018-12-16 MED ORDER — FAMOTIDINE 20 MG PO TABS: 40.0000 mg | ORAL_TABLET | Freq: Once | ORAL | Status: DC | PRN

## 2018-12-16 MED ORDER — SODIUM CHLORIDE 0.9 % IV SOLN
INTRAVENOUS | Status: DC
  Administered 2018-12-16: 10:00:00 via INTRAVENOUS

## 2018-12-16 MED ORDER — ACETAMINOPHEN 325 MG PO TABS: 650.0000 mg | ORAL_TABLET | ORAL | Status: DC | PRN

## 2018-12-16 MED ORDER — EPHEDRINE SULFATE 50 MG/ML IJ SOLN
INTRAMUSCULAR | Status: DC | PRN
  Administered 2018-12-16: 10 mg via INTRAVENOUS

## 2018-12-16 MED ORDER — SODIUM CHLORIDE 0.9% FLUSH: 3.0000 mL | Freq: Two times a day (BID) | INTRAVENOUS | Status: DC

## 2018-12-16 MED ORDER — PROPOFOL 10 MG/ML IV BOLUS
INTRAVENOUS | Status: DC | PRN
  Administered 2018-12-16: 150 mg via INTRAVENOUS

## 2018-12-16 MED ORDER — IODIXANOL 320 MG/ML IV SOLN
INTRAVENOUS | Status: DC | PRN
  Administered 2018-12-16: 10888 mL via INTRAVENOUS

## 2018-12-16 MED ORDER — LIDOCAINE HCL (CARDIAC) PF 100 MG/5ML IV SOSY
PREFILLED_SYRINGE | INTRAVENOUS | Status: DC | PRN
  Administered 2018-12-16: 100 mg via INTRAVENOUS

## 2018-12-16 MED ORDER — ONDANSETRON HCL 4 MG/2ML IJ SOLN: 4.0000 mg | Freq: Once | INTRAMUSCULAR | Status: DC | PRN

## 2018-12-16 MED ORDER — DIPHENHYDRAMINE HCL 50 MG/ML IJ SOLN: 50.0000 mg | Freq: Once | INTRAMUSCULAR | Status: DC | PRN

## 2018-12-16 MED ORDER — HYDRALAZINE HCL 20 MG/ML IJ SOLN: 5.0000 mg | INTRAMUSCULAR | Status: DC | PRN

## 2018-12-16 MED ORDER — PHENYLEPHRINE HCL (PRESSORS) 10 MG/ML IV SOLN
INTRAVENOUS | Status: DC | PRN
  Administered 2018-12-16 (×3): 100 ug via INTRAVENOUS

## 2018-12-16 MED ORDER — SUCCINYLCHOLINE CHLORIDE 20 MG/ML IJ SOLN
INTRAMUSCULAR | Status: DC | PRN
  Administered 2018-12-16: 140 mg via INTRAVENOUS

## 2018-12-16 MED ORDER — MIDAZOLAM HCL 2 MG/ML PO SYRP: 8.0000 mg | ORAL_SOLUTION | Freq: Once | ORAL | Status: DC | PRN

## 2018-12-16 MED ORDER — HYDROMORPHONE HCL 1 MG/ML IJ SOLN: 1.0000 mg | Freq: Once | INTRAMUSCULAR | Status: DC | PRN

## 2018-12-16 MED ORDER — METHYLPREDNISOLONE SODIUM SUCC 125 MG IJ SOLR: 125.0000 mg | Freq: Once | INTRAMUSCULAR | Status: DC | PRN

## 2018-12-16 MED ORDER — SODIUM CHLORIDE 0.9 % IV SOLN: 250.0000 mL | INTRAVENOUS | Status: DC | PRN

## 2018-12-16 MED ORDER — ONDANSETRON HCL 4 MG/2ML IJ SOLN: 4.0000 mg | Freq: Four times a day (QID) | INTRAMUSCULAR | Status: DC | PRN

## 2018-12-16 MED ORDER — MIDAZOLAM HCL 2 MG/2ML IJ SOLN
INTRAMUSCULAR | Status: AC
  Filled 2018-12-16: qty 2

## 2018-12-16 MED ORDER — LABETALOL HCL 5 MG/ML IV SOLN: 10.0000 mg | INTRAVENOUS | Status: DC | PRN

## 2018-12-16 MED ORDER — FENTANYL CITRATE (PF) 100 MCG/2ML IJ SOLN
INTRAMUSCULAR | Status: AC
  Filled 2018-12-16: qty 2

## 2018-12-16 MED ORDER — HEPARIN SODIUM (PORCINE) 1000 UNIT/ML IJ SOLN
INTRAMUSCULAR | Status: DC | PRN
  Administered 2018-12-16: 5000 [IU] via INTRAVENOUS

## 2018-12-16 MED ORDER — ONDANSETRON HCL 4 MG/2ML IJ SOLN
4.0000 mg | Freq: Four times a day (QID) | INTRAMUSCULAR | Status: DC | PRN
  Administered 2018-12-16: 4 mg via INTRAVENOUS

## 2018-12-16 MED ORDER — SODIUM CHLORIDE 0.9 % IV BOLUS
250.0000 mL | Freq: Once | INTRAVENOUS | Status: AC
  Administered 2018-12-16: 250 mL via INTRAVENOUS

## 2018-12-16 MED ORDER — LACTATED RINGERS IV SOLN
INTRAVENOUS | Status: DC | PRN
  Administered 2018-12-16: 11:00:00 via INTRAVENOUS

## 2018-12-16 SURGICAL SUPPLY — 19 items
BALLN LUTONIX 018 5X80X130 (BALLOONS) ×3
BALLN ULTRVRSE 3X100X150 (BALLOONS) ×3
BALLOON LUTONIX 018 5X80X130 (BALLOONS) IMPLANT
BALLOON ULTRVRSE 3X100X150 (BALLOONS) IMPLANT
CATH BEACON 5 .038 100 VERT TP (CATHETERS) ×2 IMPLANT
CATH PIG 70CM (CATHETERS) ×2 IMPLANT
COVER PROBE U/S 5X48 (MISCELLANEOUS) ×2 IMPLANT
DEVICE PRESTO INFLATION (MISCELLANEOUS) ×2 IMPLANT
DEVICE STARCLOSE SE CLOSURE (Vascular Products) ×2 IMPLANT
GLIDEWIRE ADV .035X260CM (WIRE) ×2 IMPLANT
PACK ANGIOGRAPHY (CUSTOM PROCEDURE TRAY) ×3 IMPLANT
SHEATH ANL2 6FRX45 HC (SHEATH) ×2 IMPLANT
SHEATH BRITE TIP 5FRX11 (SHEATH) ×2 IMPLANT
STENT VIABAHN 6X50X120 (Permanent Stent) ×2 IMPLANT
SYR MEDRAD MARK 7 150ML (SYRINGE) ×2 IMPLANT
TOWEL OR 17X26 4PK STRL BLUE (TOWEL DISPOSABLE) ×2 IMPLANT
TUBING CONTRAST HIGH PRESS 72 (TUBING) ×2 IMPLANT
WIRE G V18X300CM (WIRE) ×2 IMPLANT
WIRE J 3MM .035X145CM (WIRE) ×2 IMPLANT

## 2018-12-16 NOTE — Anesthesia Post-op Follow-up Note (Signed)
Anesthesia QCDR form completed.        

## 2018-12-16 NOTE — Anesthesia Procedure Notes (Signed)
Procedure Name: Intubation Performed by: Alexandrina Fiorini, Dierdre Forth, CRNA Pre-anesthesia Checklist: Patient identified, Emergency Drugs available, Suction available and Patient being monitored Patient Re-evaluated:Patient Re-evaluated prior to induction Oxygen Delivery Method: Circle system utilized Preoxygenation: Pre-oxygenation with 100% oxygen Induction Type: IV induction Ventilation: Mask ventilation without difficulty Laryngoscope Size: Mac and 3 Grade View: Grade II Tube type: Oral Tube size: 7.0 mm Number of attempts: 1 Airway Equipment and Method: Stylet and Oral airway Placement Confirmation: ETT inserted through vocal cords under direct vision,  positive ETCO2 and breath sounds checked- equal and bilateral Secured at: 21 cm Tube secured with: Tape Dental Injury: Teeth and Oropharynx as per pre-operative assessment

## 2018-12-16 NOTE — Discharge Instructions (Signed)
Femoral Site Care °This sheet gives you information about how to care for yourself after your procedure. Your health care provider may also give you more specific instructions. If you have problems or questions, contact your health care provider. °What can I expect after the procedure? °After the procedure, it is common to have: °· Bruising that usually fades within 1-2 weeks. °· Tenderness at the site. °Follow these instructions at home: °Wound care °· Follow instructions from your health care provider about how to take care of your insertion site. Make sure you: °? Wash your hands with soap and water before you change your bandage (dressing). If soap and water are not available, use hand sanitizer. °? Change your dressing as told by your health care provider. °? Leave stitches (sutures), skin glue, or adhesive strips in place. These skin closures may need to stay in place for 2 weeks or longer. If adhesive strip edges start to loosen and curl up, you may trim the loose edges. Do not remove adhesive strips completely unless your health care provider tells you to do that. °· Do not take baths, swim, or use a hot tub until your health care provider approves. °· You may shower 24-48 hours after the procedure or as told by your health care provider. °? Gently wash the site with plain soap and water. °? Pat the area dry with a clean towel. °? Do not rub the site. This may cause bleeding. °· Do not apply powder or lotion to the site. Keep the site clean and dry. °· Check your femoral site every day for signs of infection. Check for: °? Redness, swelling, or pain. °? Fluid or blood. °? Warmth. °? Pus or a bad smell. °Activity °· For the first 2-3 days after your procedure, or as long as directed: °? Avoid climbing stairs as much as possible. °? Do not squat. °· Do not lift anything that is heavier than 10 lb (4.5 kg), or the limit that you are told, until your health care provider says that it is safe. °· Rest as  directed. °? Avoid sitting for a long time without moving. Get up to take short walks every 1-2 hours. °· Do not drive for 24 hours if you were given a medicine to help you relax (sedative). °General instructions °· Take over-the-counter and prescription medicines only as told by your health care provider. °· Keep all follow-up visits as told by your health care provider. This is important. °Contact a health care provider if you have: °· A fever or chills. °· You have redness, swelling, or pain around your insertion site. °Get help right away if: °· The catheter insertion area swells very fast. °· You pass out. °· You suddenly start to sweat or your skin gets clammy. °· The catheter insertion area is bleeding, and the bleeding does not stop when you hold steady pressure on the area. °· The area near or just beyond the catheter insertion site becomes pale, cool, tingly, or numb. °These symptoms may represent a serious problem that is an emergency. Do not wait to see if the symptoms will go away. Get medical help right away. Call your local emergency services (911 in the [place] Do not drive yourself to the hospital. °Summary °· After the procedure, it is common to have bruising that usually fades within 1-2 weeks. °· Check your femoral site every day for signs of infection. °· Do not lift anything that is heavier than 10 lb (4.5 kg), or the   limit that you are told, until your health care provider says that it is safe. This information is not intended to replace advice given to you by your health care provider. Make sure you discuss any questions you have with your health care provider. Document Released: 12/19/2013 Document Revised: 04/30/2017 Document Reviewed: 04/30/2017 Elsevier Patient Education  [place].   Moderate Conscious Sedation, Adult, Care After These instructions provide you with information about caring for yourself after your procedure. Your health care provider may also give you more  specific instructions. Your treatment has been planned according to current medical practices, but problems sometimes occur. Call your health care provider if you have any problems or questions after your procedure. What can I expect after the procedure? After your procedure, it is common:  To feel sleepy for several hours.  To feel clumsy and have poor balance for several hours.  To have poor judgment for several hours.  To vomit if you eat too soon. Follow these instructions at home: For at least 24 hours after the procedure:   Do not: ? Participate in activities where you could fall or become injured. ? Drive. ? Use heavy machinery. ? Drink alcohol. ? Take sleeping pills or medicines that cause drowsiness. ? Make important decisions or sign legal documents. ? Take care of children on your own.  Rest. Eating and drinking  Follow the diet recommended by your health care provider.  If you vomit: ? Drink water, juice, or soup when you can drink without vomiting. ? Make sure you have little or no nausea before eating solid foods. General instructions  Have a responsible adult stay with you until you are awake and alert.  Take over-the-counter and prescription medicines only as told by your health care provider.  If you smoke, do not smoke without supervision.  Keep all follow-up visits as told by your health care provider. This is important. Contact a health care provider if:  You keep feeling nauseous or you keep vomiting.  You feel light-headed.  You develop a rash.  You have a fever. Get help right away if:  You have trouble breathing. This information is not intended to replace advice given to you by your health care provider. Make sure you discuss any questions you have with your health care provider. Document Released: 02/05/2013 Document Revised: 03/30/2017 Document Reviewed: 08/07/2015 Elsevier Patient Education  2020 [place]  After This sheet gives you information about how to care for yourself after your procedure. Your doctor may also give you more specific instructions. If you have problems or questions, contact your doctor. Follow these instructions at home: Insertion site care  Follow instructions from your doctor about how to take care of your long, thin tube (catheter) insertion area. Make sure you: ? Wash your hands with soap and water before you change your bandage (dressing). If you cannot use soap and water, use hand sanitizer. ? Change your bandage as told by your doctor. ? Leave stitches (sutures), skin glue, or skin tape (adhesive) strips in place. They may need to stay in place for 2 weeks or longer. If tape strips get loose and curl up, you may trim the loose edges. Do not remove tape strips completely unless your doctor says it is okay.  Do not take baths, swim, or use a hot tub until your doctor says it is okay.  You may shower 24-48 hours after the procedure or as told by your doctor. ?  Gently wash the area with plain soap and water. ? Pat the area dry with a clean towel. ? Do not rub the area. This may cause bleeding.  Do not apply powder or lotion to the area. Keep the area clean and dry.  Check your insertion area every day for signs of infection. Check for: ? More redness, swelling, or pain. ? Fluid or blood. ? Warmth. ? Pus or a bad smell. Activity  Rest as told by your doctor, usually for 1-2 days.  Do not lift anything that is heavier than 10 lbs. (4.5 kg) or as told by your doctor.  Do not drive for 24 hours if you were given a medicine to help you relax (sedative).  Do not drive or use heavy machinery while taking prescription pain medicine. General instructions   Go back to your normal activities as told by your doctor, usually in about a week. Ask your doctor what activities are safe for you.  If the insertion area starts to bleed, lie flat and put pressure on the area.  If the bleeding does not stop, get help right away. This is an emergency.  Drink enough fluid to keep your pee (urine) clear or pale yellow.  Take over-the-counter and prescription medicines only as told by your doctor.  Keep all follow-up visits as told by your doctor. This is important. Contact a doctor if:  You have a fever.  You have chills.  You have more redness, swelling, or pain around your insertion area.  You have fluid or blood coming from your insertion area.  The insertion area feels warm to the touch.  You have pus or a bad smell coming from your insertion area.  You have more bruising around the insertion area.  Blood collects in the tissue around the insertion area (hematoma) that may be painful to the touch. Get help right away if:  You have a lot of pain in the insertion area.  The insertion area swells very fast.  The insertion area is bleeding, and the bleeding does not stop after holding steady pressure on the area.  The area near or just beyond the insertion area becomes pale, cool, tingly, or numb. These symptoms may be an emergency. Do not wait to see if the symptoms will go away. Get medical help right away. Call your local emergency services (911 in the [place] Do not drive yourself to the hospital. Summary  After the procedure, it is common to have bruising and tenderness at the long, thin tube insertion area.  After the procedure, it is important to rest and drink plenty of fluids.  Do not take baths, swim, or use a hot tub until your doctor says it is okay to do so. You may shower 24-48 hours after the procedure or as told by your doctor.  If the insertion area starts to bleed, lie flat and put pressure on the area. If the bleeding does not stop, get help right away. This is an emergency. This information is not intended to replace advice given to you by your health care provider. Make sure you discuss any questions you have with your health care  provider. Document Released: 07/14/2008 Document Revised: 03/30/2017 Document Reviewed: 04/11/2016 Elsevier Patient Education  2020 [employer].

## 2018-12-16 NOTE — Anesthesia Preprocedure Evaluation (Addendum)
Anesthesia Evaluation  Patient identified by MRN, date of birth, ID band Patient awake    Reviewed: Allergy & Precautions, NPO status , Patient's Chart, lab work & pertinent test results  History of Anesthesia Complications Negative for: history of anesthetic complications  Airway Mallampati: II  TM Distance: >3 FB Neck ROM: Full    Dental  (+) Upper Dentures   Pulmonary Sleep apnea: not diagnosed, but suggestive hx. , neg COPD, Current Smoker and Patient abstained from smoking.,    breath sounds clear to auscultation- rhonchi (-) wheezing      Cardiovascular hypertension, Pt. on medications (-) angina+ CAD, + Past MI, + CABG (2016) and + Peripheral Vascular Disease   Rhythm:Regular Rate:Normal - Systolic murmurs and - Diastolic murmurs    Neuro/Psych neg Seizures PSYCHIATRIC DISORDERS Anxiety Depression    GI/Hepatic Neg liver ROS, GERD  ,  Endo/Other  diabetes, Insulin Dependent  Renal/GU negative Renal ROS     Musculoskeletal  (+) Arthritis ,   Abdominal (+) + obese,   Peds negative pediatric ROS (+)  Hematology negative hematology ROS (+)   Anesthesia Other Findings Past Medical History: No date: Anxiety No date: Coronary artery disease     Comment:  a. NSTEMI 06/2014; b. 4 vessel CABG 06/08/2014 [facility] to LAD,              SVG to Diagonal, SVG to OM, & SVG to PDA No date: Diabetic neuropathy (HCC) No date: GERD (gastroesophageal reflux disease) No date: Heart attack ([place]) No date: HTN (hypertension) No date: IDDM (insulin dependent diabetes mellitus) ([place]) No date: Ischemic cardiomyopathy     Comment:  a. echo 06/04/14 EF 40-45%, HK of entire inferolateral and              inferior myocardium c.w infarct of RCA/LCx, GR2DD, mild               MR No date: OA (osteoarthritis) of knee No date: Obesity No date: Osteoarthritis No date: Osteomyelitis ([place]) No date: Peripheral neuropathy No date: Peripheral  vascular disease ([place]) No date: Tobacco abuse   Reproductive/Obstetrics                            Anesthesia Physical  Anesthesia Plan  ASA: III  Anesthesia Plan: General   Post-op Pain Management:    Induction: Intravenous  PONV Risk Score and Plan: 1 and Ondansetron  Airway Management Planned: Oral ETT  Additional Equipment:   Intra-op Plan:   Post-operative Plan: Extubation in OR  Informed Consent: I have reviewed the patients History and Physical, chart, labs and discussed the procedure including the risks, benefits and alternatives for the proposed anesthesia with the patient or authorized representative who has indicated his/her understanding and acceptance.     Dental advisory given  Plan Discussed with: CRNA and Anesthesiologist  Anesthesia Plan Comments:         Anesthesia Quick Evaluation

## 2018-12-16 NOTE — Anesthesia Postprocedure Evaluation (Signed)
Anesthesia Post Note  Patient: Melinda [name]  Procedure(s) Performed: LOWER EXTREMITY ANGIOGRAPHY (Left )  Patient location during evaluation: PACU Anesthesia Type: General Level of consciousness: awake and alert and oriented Pain management: pain level controlled Vital Signs Assessment: post-procedure vital signs reviewed and stable Respiratory status: spontaneous breathing, nonlabored ventilation and respiratory function stable Cardiovascular status: blood pressure returned to baseline and stable Postop Assessment: no signs of nausea or vomiting Anesthetic complications: no     Last Vitals:  Vitals:   12/16/18 1358 12/16/18 1400  BP: (!) 150/76 (!) 162/74  Pulse: 92   Resp: 19   Temp:    SpO2: 95%     Last Pain:  Vitals:   12/16/18 1358  TempSrc:   PainSc: 0-No pain                 Beonka Amesquita

## 2018-12-16 NOTE — H&P (Signed)
[facility] History & Physical Update  The patient was interviewed and re-examined.  The patient's previous History and Physical has been reviewed and is unchanged.  There is no change in the plan of care. We plan to proceed with the scheduled procedure.  [name], MD  12/16/2018, 9:49 AM

## 2018-12-16 NOTE — Transfer of Care (Signed)
Immediate Anesthesia Transfer of Care Note  Patient: Melinda [name]  Procedure(s) Performed: LOWER EXTREMITY ANGIOGRAPHY (Left )  Patient Location: PACU  Anesthesia Type:General  Level of Consciousness: awake and drowsy  Airway & Oxygen Therapy: Patient Spontanous Breathing and Patient connected to face mask oxygen  Post-op Assessment: Report given to RN and Post -op Vital signs reviewed and stable  Post vital signs: Reviewed and stable  Last Vitals:  Vitals Value Taken Time  BP 149/75 12/16/18 1225  Temp    Pulse 95 12/16/18 1227  Resp 19 12/16/18 1227  SpO2 98 % 12/16/18 1227  Vitals shown include unvalidated device data.  Last Pain:  Vitals:   12/16/18 0948  TempSrc: Oral  PainSc: 0-No pain         Complications: No apparent anesthesia complications

## 2018-12-16 NOTE — Progress Notes (Signed)
Dr. Lucky Cowboy talked with patient and sister about procedure. I provided discharge instructions to patient and sister Earleen Reaper). Both stated understanding and no further questions at this time. Patient was wheeled to front for pickup.

## 2018-12-16 NOTE — Op Note (Signed)
[facility]  Percutaneous Study/Intervention Procedural Note   Date of Surgery: 12/16/2018  Surgeon(s):Mischa Pollard    Assistants:none  Pre-operative Diagnosis: PAD with ulceration BLE  Post-operative diagnosis:  Same  Procedure(s) Performed:             1.  Ultrasound guidance for vascular access right femoral artery             2.  Catheter placement into left SFA from right femoral approach             3.  Aortogram and selective left lower extremity angiogram             4.  Percutaneous transluminal angioplasty of the anterior tibial artery with 3 mm diameter by 10 cm length angioplasty balloon             5.   Percutaneous transluminal angioplasty of the left distal SFA with 5 mm diameter by 8 cm length Lutonix drug-coated angioplasty balloon  6.  Viabahn stent placement to the distal left SFA with 6 mm diameter by 5 cm length stent for greater than 50% residual stenosis after angioplasty             7.  StarClose closure device right femoral artery  EBL: 5 cc  Contrast: 80 cc  Fluoro Time: 3.2 minutes  Anesthesia: General              Indications:  Patient is a 63 y.o.female with reduced ABIs bilaterally and ulcers on both feet. The patient is brought in for angiography for further evaluation and potential treatment.  Due to the limb threatening nature of the situation, angiogram was performed for attempted limb salvage. The patient is aware that if the procedure fails, amputation would be expected.  The patient also understands that even with successful revascularization, amputation may still be required due to the severity of the situation.  Risks and benefits are discussed and informed consent is obtained.   Procedure:  The patient was identified and appropriate procedural time out was performed.  The patient was then placed supine on the table and prepped and draped in the usual sterile fashion.   Our anesthesia colleagues provided a general  anesthetic. Ultrasound was used to evaluate the right common femoral artery.  It was patent .  A digital ultrasound image was acquired.  A Seldinger needle was used to access the right common femoral artery under direct ultrasound guidance and a permanent image was performed.  A 0.035 J wire was advanced without resistance and a 5Fr sheath was placed.  Pigtail catheter was placed into the aorta and an AP aortogram was performed. This demonstrated normal renal arteries and normal aorta and iliac segments without significant stenosis. I then crossed the aortic bifurcation and advanced to the left femoral head and then into the proximal SFA to opacify distally. Selective left lower extremity angiogram was then performed. This demonstrated normal common femoral artery, some disease particular in the more distal profunda femoris artery but no origin stenosis.  The SFA was widely patent proximally within the previously placed stents, but at the distal edge of the stents in the distal SFA was a high-grade hyperplastic stenosis of 85 to 90%.  The vessel then normalized in the above-knee popliteal artery.  The anterior tibial artery was the dominant runoff distally but there was stenosis in the proximal anterior tibial artery in the 65 to 70% range just a couple of centimeters beyond the origin.  The  peroneal artery was continuous distally in its normal course but had very poor collateral flow to the foot and was small.  The posterior tibial artery was chronically occluded without distal reconstitution seen. It was felt that it was in the patient's best interest to proceed with intervention after these images to avoid a second procedure and a larger amount of contrast and fluoroscopy based off of the findings from the initial angiogram. The patient was systemically heparinized and a 6 [place] Ansell sheath was then placed over the [employer] wire. I then used a Kumpe catheter and the advantage wire to navigate through  the SFA lesion and confirm intraluminal flow in the popliteal artery.  I then advanced down into the proximal anterior tibial artery where selective imaging confirmed the moderate stenosis in the proximal anterior tibial artery.  There was some small vessel disease distally with poor flow in the foot, but this was not amenable to revascularization.  I then placed a 0.018 wire across the lesion and parked this in the distal anterior tibial artery and remove the Kumpe catheter.  A 3 mm diameter by 10 cm length angioplasty balloon was inflated to 6 atm for 1 minute in the proximal anterior tibial artery.  Completion imaging showed only about a 10 to 15% residual stenosis in the anterior tibial artery.  I then turned my attention to the distal SFA lesion.  A 5 mm diameter by 8 cm length Lutonix drug-coated angioplasty balloon was inflated to 8 atm for 1 minute.  Completion imaging showed a residual hyperplastic flap of greater than 70% after angioplasty so I elected to place a stent in this area.  A 6 mm diameter by 5 cm length Viabahn stent was then deployed in the distal SFA to encompass the lesion and was postdilated with a 5 mm balloon with excellent angiographic completion result and no significant residual stenosis. I elected to terminate the procedure. The sheath was removed and StarClose closure device was deployed in the right femoral artery with excellent hemostatic result. The patient was taken to the recovery room in stable condition having tolerated the procedure well.  Findings:               Aortogram:  Normal renal arteries, normal aorta and iliac arteries without significant stenosis             Left lower Extremity:  Normal common femoral artery, some disease particular in the more distal profunda femoris artery but no origin stenosis.  The SFA was widely patent proximally within the previously placed stents, but at the distal edge of the stents in the distal SFA was a high-grade hyperplastic  stenosis of 85 to 90%.  The vessel then normalized in the above-knee popliteal artery.  The anterior tibial artery was the dominant runoff distally but there was stenosis in the proximal anterior tibial artery in the 65 to 70% range just a couple of centimeters beyond the origin.  The peroneal artery was continuous distally in its normal course but had very poor collateral flow to the foot and was small.  The posterior tibial artery was chronically occluded without distal reconstitution seen.   Disposition: Patient was taken to the recovery room in stable condition having tolerated the procedure well.  Complications: None  [name] 12/16/2018 12:18 PM   This note was created with Dragon Medical transcription system. Any errors in dictation are purely unintentional.

## 2018-12-17 ENCOUNTER — Encounter: Payer: Self-pay | Admitting: Vascular Surgery

## 2018-12-19 ENCOUNTER — Other Ambulatory Visit
Admission: RE | Admit: 2018-12-19 | Discharge: 2018-12-19 | Disposition: A | Discharge status: Home / Self Care | Payer: Medicare HMO | Source: Ambulatory Visit | Attending: Vascular Surgery | Admitting: Vascular Surgery

## 2018-12-19 ENCOUNTER — Other Ambulatory Visit: Payer: Self-pay

## 2018-12-19 DIAGNOSIS — Z01812 Encounter for preprocedural laboratory examination: Secondary | ICD-10-CM | POA: Insufficient documentation

## 2018-12-19 DIAGNOSIS — Z20828 Contact with and (suspected) exposure to other viral communicable diseases: Secondary | ICD-10-CM | POA: Insufficient documentation

## 2018-12-19 LAB — SARS CORONAVIRUS 2 (TAT 6-24 HRS): SARS Coronavirus 2: NEGATIVE

## 2018-12-22 ENCOUNTER — Other Ambulatory Visit (INDEPENDENT_AMBULATORY_CARE_PROVIDER_SITE_OTHER): Payer: Self-pay | Admitting: Nurse Practitioner

## 2018-12-23 ENCOUNTER — Other Ambulatory Visit: Payer: Self-pay

## 2018-12-23 ENCOUNTER — Encounter: Payer: Self-pay | Admitting: Vascular Surgery

## 2018-12-23 ENCOUNTER — Encounter
Admission: RE | Disposition: A | Discharge status: Home / Self Care | Payer: Self-pay | Source: Home / Self Care | Attending: Vascular Surgery

## 2018-12-23 ENCOUNTER — Ambulatory Visit: Payer: Self-pay | Admitting: Urology

## 2018-12-23 ENCOUNTER — Ambulatory Visit: Payer: Medicare HMO | Admitting: Anesthesiology

## 2018-12-23 ENCOUNTER — Ambulatory Visit
Admission: RE | Admit: 2018-12-23 | Discharge: 2018-12-23 | Disposition: A | Discharge status: Home / Self Care | Payer: Medicare HMO | Attending: Vascular Surgery | Admitting: Vascular Surgery

## 2018-12-23 DIAGNOSIS — Z794 Long term (current) use of insulin: Secondary | ICD-10-CM | POA: Diagnosis not present

## 2018-12-23 DIAGNOSIS — I255 Ischemic cardiomyopathy: Secondary | ICD-10-CM | POA: Insufficient documentation

## 2018-12-23 DIAGNOSIS — M1990 Unspecified osteoarthritis, unspecified site: Secondary | ICD-10-CM | POA: Diagnosis not present

## 2018-12-23 DIAGNOSIS — Z89421 Acquired absence of other right toe(s): Secondary | ICD-10-CM | POA: Diagnosis not present

## 2018-12-23 DIAGNOSIS — E11621 Type 2 diabetes mellitus with foot ulcer: Secondary | ICD-10-CM | POA: Insufficient documentation

## 2018-12-23 DIAGNOSIS — Z8249 Family history of ischemic heart disease and other diseases of the circulatory system: Secondary | ICD-10-CM | POA: Insufficient documentation

## 2018-12-23 DIAGNOSIS — I10 Essential (primary) hypertension: Secondary | ICD-10-CM | POA: Insufficient documentation

## 2018-12-23 DIAGNOSIS — L97519 Non-pressure chronic ulcer of other part of right foot with unspecified severity: Secondary | ICD-10-CM | POA: Insufficient documentation

## 2018-12-23 DIAGNOSIS — I2510 Atherosclerotic heart disease of native coronary artery without angina pectoris: Secondary | ICD-10-CM | POA: Diagnosis not present

## 2018-12-23 DIAGNOSIS — Z7902 Long term (current) use of antithrombotics/antiplatelets: Secondary | ICD-10-CM | POA: Diagnosis not present

## 2018-12-23 DIAGNOSIS — L97529 Non-pressure chronic ulcer of other part of left foot with unspecified severity: Secondary | ICD-10-CM | POA: Insufficient documentation

## 2018-12-23 DIAGNOSIS — Z89412 Acquired absence of left great toe: Secondary | ICD-10-CM | POA: Insufficient documentation

## 2018-12-23 DIAGNOSIS — E1142 Type 2 diabetes mellitus with diabetic polyneuropathy: Secondary | ICD-10-CM | POA: Insufficient documentation

## 2018-12-23 DIAGNOSIS — F17210 Nicotine dependence, cigarettes, uncomplicated: Secondary | ICD-10-CM | POA: Insufficient documentation

## 2018-12-23 DIAGNOSIS — Z951 Presence of aortocoronary bypass graft: Secondary | ICD-10-CM | POA: Diagnosis not present

## 2018-12-23 DIAGNOSIS — Z95828 Presence of other vascular implants and grafts: Secondary | ICD-10-CM | POA: Diagnosis not present

## 2018-12-23 DIAGNOSIS — Z6841 Body Mass Index (BMI) 40.0 and over, adult: Secondary | ICD-10-CM | POA: Diagnosis not present

## 2018-12-23 DIAGNOSIS — I70235 Atherosclerosis of native arteries of right leg with ulceration of other part of foot: Secondary | ICD-10-CM | POA: Diagnosis not present

## 2018-12-23 DIAGNOSIS — I70245 Atherosclerosis of native arteries of left leg with ulceration of other part of foot: Secondary | ICD-10-CM | POA: Insufficient documentation

## 2018-12-23 DIAGNOSIS — I70223 Atherosclerosis of native arteries of extremities with rest pain, bilateral legs: Secondary | ICD-10-CM | POA: Diagnosis not present

## 2018-12-23 DIAGNOSIS — L97909 Non-pressure chronic ulcer of unspecified part of unspecified lower leg with unspecified severity: Secondary | ICD-10-CM

## 2018-12-23 DIAGNOSIS — Z79899 Other long term (current) drug therapy: Secondary | ICD-10-CM | POA: Diagnosis not present

## 2018-12-23 DIAGNOSIS — E669 Obesity, unspecified: Secondary | ICD-10-CM | POA: Diagnosis not present

## 2018-12-23 DIAGNOSIS — Z7982 Long term (current) use of aspirin: Secondary | ICD-10-CM | POA: Diagnosis not present

## 2018-12-23 DIAGNOSIS — Z833 Family history of diabetes mellitus: Secondary | ICD-10-CM | POA: Insufficient documentation

## 2018-12-23 DIAGNOSIS — E785 Hyperlipidemia, unspecified: Secondary | ICD-10-CM | POA: Diagnosis not present

## 2018-12-23 DIAGNOSIS — M179 Osteoarthritis of knee, unspecified: Secondary | ICD-10-CM | POA: Diagnosis not present

## 2018-12-23 DIAGNOSIS — K219 Gastro-esophageal reflux disease without esophagitis: Secondary | ICD-10-CM | POA: Diagnosis not present

## 2018-12-23 DIAGNOSIS — I70299 Other atherosclerosis of native arteries of extremities, unspecified extremity: Secondary | ICD-10-CM

## 2018-12-23 HISTORY — PX: LOWER EXTREMITY ANGIOGRAPHY: CATH118251

## 2018-12-23 LAB — BUN: BUN: 25 mg/dL — ABNORMAL HIGH (ref 8–23)

## 2018-12-23 LAB — GLUCOSE, CAPILLARY: Glucose-Capillary: 110 mg/dL — ABNORMAL HIGH (ref 70–99)

## 2018-12-23 LAB — CREATININE, SERUM
Creatinine, Ser: 1.45 mg/dL — ABNORMAL HIGH (ref 0.44–1.00)
GFR calc Af Amer: 44 mL/min — ABNORMAL LOW (ref >60)
GFR calc non Af Amer: 38 mL/min — ABNORMAL LOW (ref >60)

## 2018-12-23 SURGERY — LOWER EXTREMITY ANGIOGRAPHY
Anesthesia: General | Laterality: Right

## 2018-12-23 MED ORDER — FENTANYL CITRATE (PF) 100 MCG/2ML IJ SOLN
INTRAMUSCULAR | Status: AC
  Filled 2018-12-23: qty 2

## 2018-12-23 MED ORDER — SODIUM CHLORIDE 0.9 % IV SOLN: INTRAVENOUS | Status: DC

## 2018-12-23 MED ORDER — SODIUM CHLORIDE 0.9 % IV SOLN: 250.0000 mL | INTRAVENOUS | Status: DC | PRN

## 2018-12-23 MED ORDER — OXYCODONE HCL 5 MG PO TABS: 5.0000 mg | ORAL_TABLET | Freq: Once | ORAL | Status: DC | PRN

## 2018-12-23 MED ORDER — SODIUM CHLORIDE 0.9 % IV SOLN
INTRAVENOUS | Status: DC | PRN
  Administered 2018-12-23: 25 ug/min via INTRAVENOUS

## 2018-12-23 MED ORDER — DIPHENHYDRAMINE HCL 50 MG/ML IJ SOLN: 50.0000 mg | Freq: Once | INTRAMUSCULAR | Status: DC | PRN

## 2018-12-23 MED ORDER — SODIUM CHLORIDE 0.9 % IV SOLN
INTRAVENOUS | Status: DC
  Administered 2018-12-23: 09:00:00 via INTRAVENOUS

## 2018-12-23 MED ORDER — LABETALOL HCL 5 MG/ML IV SOLN: 10.0000 mg | INTRAVENOUS | Status: DC | PRN

## 2018-12-23 MED ORDER — FENTANYL CITRATE (PF) 100 MCG/2ML IJ SOLN: 25.0000 ug | INTRAMUSCULAR | Status: DC | PRN

## 2018-12-23 MED ORDER — PROPOFOL 10 MG/ML IV BOLUS
INTRAVENOUS | Status: AC
  Filled 2018-12-23: qty 20

## 2018-12-23 MED ORDER — LIDOCAINE HCL (CARDIAC) PF 100 MG/5ML IV SOSY
PREFILLED_SYRINGE | INTRAVENOUS | Status: DC | PRN
  Administered 2018-12-23: 60 mg via INTRAVENOUS

## 2018-12-23 MED ORDER — OXYCODONE HCL 5 MG/5ML PO SOLN: 5.0000 mg | Freq: Once | ORAL | Status: DC | PRN

## 2018-12-23 MED ORDER — SUCCINYLCHOLINE CHLORIDE 20 MG/ML IJ SOLN
INTRAMUSCULAR | Status: AC
  Filled 2018-12-23: qty 1

## 2018-12-23 MED ORDER — SODIUM CHLORIDE 0.9% FLUSH: 3.0000 mL | Freq: Two times a day (BID) | INTRAVENOUS | Status: DC

## 2018-12-23 MED ORDER — HEPARIN SODIUM (PORCINE) 1000 UNIT/ML IJ SOLN
INTRAMUSCULAR | Status: AC
  Filled 2018-12-23: qty 1

## 2018-12-23 MED ORDER — MIDAZOLAM HCL 2 MG/ML PO SYRP: 8.0000 mg | ORAL_SOLUTION | Freq: Once | ORAL | Status: DC | PRN

## 2018-12-23 MED ORDER — PHENYLEPHRINE HCL (PRESSORS) 10 MG/ML IV SOLN
INTRAVENOUS | Status: DC | PRN
  Administered 2018-12-23 (×3): 100 ug via INTRAVENOUS

## 2018-12-23 MED ORDER — FENTANYL CITRATE (PF) 100 MCG/2ML IJ SOLN
INTRAMUSCULAR | Status: DC | PRN
  Administered 2018-12-23: 50 ug via INTRAVENOUS
  Administered 2018-12-23: 100 ug via INTRAVENOUS

## 2018-12-23 MED ORDER — METHYLPREDNISOLONE SODIUM SUCC 125 MG IJ SOLR: 125.0000 mg | Freq: Once | INTRAMUSCULAR | Status: DC | PRN

## 2018-12-23 MED ORDER — PHENYLEPHRINE HCL (PRESSORS) 10 MG/ML IV SOLN
INTRAVENOUS | Status: AC
  Filled 2018-12-23: qty 1

## 2018-12-23 MED ORDER — IODIXANOL 320 MG/ML IV SOLN
INTRAVENOUS | Status: DC | PRN
  Administered 2018-12-23: 95 mL via INTRA_ARTERIAL

## 2018-12-23 MED ORDER — ACETAMINOPHEN 325 MG PO TABS: 650.0000 mg | ORAL_TABLET | ORAL | Status: DC | PRN

## 2018-12-23 MED ORDER — ONDANSETRON HCL 4 MG/2ML IJ SOLN
INTRAMUSCULAR | Status: DC | PRN
  Administered 2018-12-23: 4 mg via INTRAVENOUS

## 2018-12-23 MED ORDER — ONDANSETRON HCL 4 MG/2ML IJ SOLN: 4.0000 mg | Freq: Four times a day (QID) | INTRAMUSCULAR | Status: DC | PRN

## 2018-12-23 MED ORDER — MIDAZOLAM HCL 2 MG/2ML IJ SOLN
INTRAMUSCULAR | Status: AC
  Filled 2018-12-23: qty 2

## 2018-12-23 MED ORDER — CEFAZOLIN SODIUM-DEXTROSE 2-4 GM/100ML-% IV SOLN
2.0000 g | Freq: Once | INTRAVENOUS | Status: AC
  Administered 2018-12-23: 2 g via INTRAVENOUS

## 2018-12-23 MED ORDER — LIDOCAINE HCL (PF) 2 % IJ SOLN
INTRAMUSCULAR | Status: AC
  Filled 2018-12-23: qty 10

## 2018-12-23 MED ORDER — SUCCINYLCHOLINE CHLORIDE 20 MG/ML IJ SOLN
INTRAMUSCULAR | Status: DC | PRN
  Administered 2018-12-23: 140 mg via INTRAVENOUS

## 2018-12-23 MED ORDER — CEFAZOLIN SODIUM-DEXTROSE 2-4 GM/100ML-% IV SOLN
INTRAVENOUS | Status: AC
  Filled 2018-12-23: qty 100

## 2018-12-23 MED ORDER — HEPARIN SODIUM (PORCINE) 1000 UNIT/ML IJ SOLN
INTRAMUSCULAR | Status: DC | PRN
  Administered 2018-12-23: 6000 [IU] via INTRAVENOUS

## 2018-12-23 MED ORDER — HYDRALAZINE HCL 20 MG/ML IJ SOLN: 5.0000 mg | INTRAMUSCULAR | Status: DC | PRN

## 2018-12-23 MED ORDER — HYDROMORPHONE HCL 1 MG/ML IJ SOLN: 1.0000 mg | Freq: Once | INTRAMUSCULAR | Status: DC | PRN

## 2018-12-23 MED ORDER — PROPOFOL 10 MG/ML IV BOLUS
INTRAVENOUS | Status: DC | PRN
  Administered 2018-12-23: 180 mg via INTRAVENOUS
  Administered 2018-12-23: 50 mg via INTRAVENOUS

## 2018-12-23 MED ORDER — SODIUM CHLORIDE 0.9% FLUSH: 3.0000 mL | INTRAVENOUS | Status: DC | PRN

## 2018-12-23 MED ORDER — FAMOTIDINE 20 MG PO TABS: 40.0000 mg | ORAL_TABLET | Freq: Once | ORAL | Status: DC | PRN

## 2018-12-23 MED ORDER — MIDAZOLAM HCL 2 MG/2ML IJ SOLN
INTRAMUSCULAR | Status: DC | PRN
  Administered 2018-12-23: 2 mg via INTRAVENOUS

## 2018-12-23 SURGICAL SUPPLY — 23 items
BALLN LUTONIX  018 4X60X130 (BALLOONS) ×2
BALLN LUTONIX 018 4X60X130 (BALLOONS) ×1
BALLN LUTONIX 018 5X100X130 (BALLOONS) ×3
BALLN LUTONIX 018 5X300X130 (BALLOONS) ×3
BALLN ULTRVRSE 3X220X150 (BALLOONS) ×3
BALLOON LUTONIX 018 4X60X130 (BALLOONS) IMPLANT
BALLOON LUTONIX 018 5X100X130 (BALLOONS) IMPLANT
BALLOON LUTONIX 018 5X300X130 (BALLOONS) IMPLANT
BALLOON ULTRVRSE 3X220X150 (BALLOONS) IMPLANT
CATH BEACON 5 .038 100 VERT TP (CATHETERS) ×2 IMPLANT
CATH CXI SUPP ST 4FR 135CM (CATHETERS) ×2 IMPLANT
CATH PIG 70CM (CATHETERS) ×2 IMPLANT
DEVICE PRESTO INFLATION (MISCELLANEOUS) ×2 IMPLANT
DEVICE STARCLOSE SE CLOSURE (Vascular Products) ×2 IMPLANT
DEVICE TORQUE .025-.038 (MISCELLANEOUS) ×2 IMPLANT
GLIDEWIRE ADV .035X260CM (WIRE) ×2 IMPLANT
PACK ANGIOGRAPHY (CUSTOM PROCEDURE TRAY) ×3 IMPLANT
SHEATH ANL2 6FRX45 HC (SHEATH) ×2 IMPLANT
SHEATH BRITE TIP 5FRX11 (SHEATH) ×2 IMPLANT
SYR MEDRAD MARK 7 150ML (SYRINGE) ×2 IMPLANT
TUBING CONTRAST HIGH PRESS 72 (TUBING) ×2 IMPLANT
WIRE G V18X300CM (WIRE) ×2 IMPLANT
WIRE J 3MM .035X145CM (WIRE) ×2 IMPLANT

## 2018-12-23 NOTE — Transfer of Care (Signed)
Immediate Anesthesia Transfer of Care Note  Patient: Melinda [name]  Procedure(s) Performed: LOWER EXTREMITY ANGIOGRAPHY (Right )  Patient Location: PACU  Anesthesia Type:General  Level of Consciousness: drowsy and patient cooperative  Airway & Oxygen Therapy: Patient Spontanous Breathing and Patient connected to face mask oxygen  Post-op Assessment: Report given to RN and Post -op Vital signs reviewed and stable  Post vital signs: Reviewed and stable  Last Vitals:  Vitals Value Taken Time  BP 136/66 12/23/18 1125  Temp 36.3 C 12/23/18 1125  Pulse 81 12/23/18 1128  Resp 16 12/23/18 1125  SpO2 96 % 12/23/18 1128  Vitals shown include unvalidated device data.  Last Pain:  Vitals:   12/23/18 0845  TempSrc: Oral  PainSc: 0-No pain         Complications: No apparent anesthesia complications

## 2018-12-23 NOTE — Anesthesia Procedure Notes (Signed)
Procedure Name: Intubation Date/Time: 12/23/2018 9:45 AM Performed by: Jonna Clark, CRNA Pre-anesthesia Checklist: Patient identified, Patient being monitored, Timeout performed, Emergency Drugs available and Suction available Patient Re-evaluated:Patient Re-evaluated prior to induction Oxygen Delivery Method: Circle system utilized Preoxygenation: Pre-oxygenation with 100% oxygen Induction Type: IV induction Ventilation: Mask ventilation without difficulty Laryngoscope Size: 3 and McGraph Grade View: Grade II Tube type: Oral Tube size: 7.0 mm Number of attempts: 1 Airway Equipment and Method: Stylet Placement Confirmation: ETT inserted through vocal cords under direct vision,  positive ETCO2 and breath sounds checked- equal and bilateral Secured at: 21 cm Tube secured with: Tape Dental Injury: Teeth and Oropharynx as per pre-operative assessment

## 2018-12-23 NOTE — Progress Notes (Signed)
Dr. Lucky Cowboy at bedside, speaking with pt. And her sister re: procedural results. Both verbalized understanding. Left groin clean, dry, intact without complications. Right LE pulses verified with doppler. No cardiac, peripheral or subjective c/o at present.

## 2018-12-23 NOTE — Anesthesia Post-op Follow-up Note (Signed)
Anesthesia QCDR form completed.        

## 2018-12-23 NOTE — Anesthesia Preprocedure Evaluation (Addendum)
Anesthesia Evaluation  Patient identified by MRN, date of birth, ID band Patient awake    Reviewed: Allergy & Precautions, H&P , NPO status , Patient's Chart, lab work & pertinent test results  Airway Mallampati: III  TM Distance: >3 FB Neck ROM: full    Dental  (+) Upper Dentures, Partial Lower   Pulmonary neg COPD, Current Smoker,           Cardiovascular hypertension, + CAD, + Past MI (NSTEMI 2016), + CABG (2016), + Peripheral Vascular Disease and +CHF  (-) dysrhythmias   06/2014 Echo: EF 40-45%, HK of entire inferolateral and inferior myocardium c/w infarct of RCA/LCx, GR2DD, mild MR   Neuro/Psych PSYCHIATRIC DISORDERS Anxiety Depression Peripheral neuropathy    GI/Hepatic Neg liver ROS, GERD  Controlled,  Endo/Other  diabetes, Insulin DependentMorbid obesity (BMI 45)  Renal/GU      Musculoskeletal   Abdominal   Peds  Hematology negative hematology ROS (+)   Anesthesia Other Findings Past Medical History: No date: Anxiety No date: Coronary artery disease     Comment:  a. 06/2014 NSTEMI s/p CABG x 4 (LIMA to LAD, VG to Diag,               VG to OM, VG to PDA). No date: Diabetic neuropathy (HCC) No date: GERD (gastroesophageal reflux disease) No date: HTN (hypertension) No date: Hyperlipidemia LDL goal <70 No date: IDDM (insulin dependent diabetes mellitus) ([place]) No date: Ischemic cardiomyopathy     Comment:  a. 06/2014 Echo: EF 40-45%, HK of entire inferolateral               and inferior myocardium c/w infarct of RCA/LCx, GR2DD,               mild MR No date: OA (osteoarthritis) of knee No date: Obesity No date: Osteoarthritis No date: Osteomyelitis ([place]) No date: Peripheral neuropathy No date: Peripheral vascular disease ([place])     Comment:  a. 09/2016 Periph Angio: CTO R popliteal, CTO L prox/mid               SFA->Med Rx; b. 05/2017 PTA: LSFA (Viabahn covered stent x              2), DEB to L Post Tibial;  c. 06/2017 ABI: R 0.44, L 1.05. No date: Tobacco abuse  Past Surgical History: 10/27/2016: ABDOMINAL AORTOGRAM W/LOWER EXTREMITY; N/A     Comment:  Procedure: Abdominal Aortogram w/Lower Extremity;                Surgeon: Nelva Bush, MD;  Location: [facility];  Service: Cardiovascular;  Laterality: N/A; 10/21/2015: AMPUTATION TOE; Left     Comment:  Procedure: AMPUTATION TOE;  Surgeon: Sharlotte Alamo, DPM;                Location: [facility];  Service: Podiatry;  Laterality:               Left; 05/15/2017: AMPUTATION TOE; Left     Comment:  Procedure: AMPUTATION LEFT GREAT TOE;  Surgeon: Sharlotte Alamo, DPM;  Location: [facility];  Service: Podiatry;                Laterality: Left; No date: AORTIC VALVE REPLACEMENT (AVR)/CORONARY ARTERY BYPASS  GRAFTING (CABG) No date: BREAST BIOPSY 06/2014: CARDIAC CATHETERIZATION  Comment:  95% stenosis mLAD, occlusion ostial OM1, 70% stenosis               LCx, 95% stenosis mRCA, EF 45%. 06/08/2014: CORONARY ARTERY BYPASS GRAFT; N/A     Comment:  Procedure: CORONARY ARTERY BYPASS GRAFTING (CABG);                Surgeon: Grace Isaac, MD;  Location: [place];                Service: Open Heart Surgery;  Laterality: N/A;  Times 4               using left internal mammary artery to LAD artery and               endoscopically harvested bilateral saphenous vein to               Obtuse Marginal, Diagonal and Posterior Descending               coronary arteries. 06/2014: CT ABD W & PELVIS WO CM     Comment:  nl liver, gallbladder, spleen, mild diverticular               changes, no bowel wall inflammation, appendix nl, no               hernia, no other sig abnormalities 05/23/2017: LOWER EXTREMITY ANGIOGRAPHY; Left     Comment:  Procedure: LOWER EXTREMITY ANGIOGRAPHY;  Surgeon: Algernon Huxley, MD;  Location: [facility];  Service:               Cardiovascular;  Laterality: Left; 05/28/2017: LOWER  EXTREMITY ANGIOGRAPHY; Left     Comment:  Procedure: LOWER EXTREMITY ANGIOGRAPHY;  Surgeon: Algernon Huxley, MD;  Location: [facility];  Service:               Cardiovascular;  Laterality: Left; 12/16/2018: LOWER EXTREMITY ANGIOGRAPHY; Left     Comment:  Procedure: LOWER EXTREMITY ANGIOGRAPHY;  Surgeon: Algernon Huxley, MD;  Location: [facility];  Service:               Cardiovascular;  Laterality: Left; 06/08/2014: TEE WITHOUT CARDIOVERSION; N/A     Comment:  Procedure: TRANSESOPHAGEAL ECHOCARDIOGRAM (TEE);                Surgeon: Grace Isaac, MD;  Location: [place];                Service: Open Heart Surgery;  Laterality: N/A; 12/2011: TOE AMPUTATION; Right     Comment:  rt middle toe 06/2014: US ECHOCARDIOGRAPHY     Comment:  EF 50-55%, HK of inf/post/inferolat walls, Ao sclerosis     Reproductive/Obstetrics negative OB ROS                           Anesthesia Physical Anesthesia Plan  ASA: III  Anesthesia Plan: General ETT   Post-op Pain Management:    Induction:   PONV Risk Score and Plan: Ondansetron, Dexamethasone, Midazolam and Treatment may vary due to age or medical condition  Airway Management Planned:   Additional Equipment:   Intra-op Plan:  Post-operative Plan:   Informed Consent: I have reviewed the patients History and Physical, chart, labs and discussed the procedure including the risks, benefits and alternatives for the proposed anesthesia with the patient or authorized representative who has indicated his/her understanding and acceptance.     Dental Advisory Given  Plan Discussed with: Anesthesiologist and CRNA  Anesthesia Plan Comments:       Anesthesia Quick Evaluation

## 2018-12-23 NOTE — Op Note (Signed)
[facility]  Percutaneous Study/Intervention Procedural Note   Date of Surgery: 12/23/2018  Surgeon(s):,    Assistants:none  Pre-operative Diagnosis: PAD with ulceration and rest pain bilateral lower extremities  Post-operative diagnosis:  Same  Procedure(s) Performed:             1.  Ultrasound guidance for vascular access left femoral artery             2.  Catheter placement into right SFA from left femoral approach             3.  Aortogram and selective right lower extremity angiogram             4.  Percutaneous transluminal angioplasty of right anterior tibial artery with 3 mm diameter by 22 cm length angioplasty balloon and 4 mm diameter by 6 cm Lutonix drug-coated angioplasty balloon in the proximal segment             5.   Percutaneous transluminal angioplasty of the right tibioperoneal trunk and peroneal artery with 3 mm diameter by 22 cm length angioplasty balloon  6.  Percutaneous transluminal angioplasty of the right SFA and popliteal arteries with a 5 mm diameter by 30 cm length and 5 mm diameter by 10 cm length Lutonix drug-coated angioplasty balloons             7.  StarClose closure device left femoral artery  EBL: 15 cc  Contrast: 95 cc  Fluoro Time: 11.1 minutes  Anesthesia: General              Indications:  Patient is a 63 y.o.female with severe peripheral arterial disease with rest pain bilaterally and with ulcerations on the feet. The patient has noninvasive study showing definitely reduced perfusion bilateral.  She is already undergone left lower extremity revascularization. The patient is brought in for angiography for further evaluation and potential treatment.  Due to the limb threatening nature of the situation, angiogram was performed for attempted limb salvage. The patient is aware that if the procedure fails, amputation would be expected.  The patient also understands that even with successful revascularization, amputation  may still be required due to the severity of the situation.  Risks and benefits are discussed and informed consent is obtained.   Procedure:  The patient was identified and appropriate procedural time out was performed.  The patient was then placed supine on the table and prepped and draped in the usual sterile fashion. Moderate conscious sedation was administered during a face to face encounter with the patient throughout the procedure with my supervision of the RN administering medicines and monitoring the patient's vital signs, pulse oximetry, telemetry and mental status throughout from the start of the procedure until the patient was taken to the recovery room. Ultrasound was used to evaluate the left common femoral artery.  It was patent .  A digital ultrasound image was acquired.  A Seldinger needle was used to access the left common femoral artery under direct ultrasound guidance and a permanent image was performed.  A 0.035 J wire was advanced without resistance and a 5Fr sheath was placed.   Aortogram was not performed as one was done last week and did not require any intervention.  I then crossed the aortic bifurcation and advanced to the right femoral head and into the proximal right SFA. Selective right lower extremity angiogram was then performed. This demonstrated normal common femoral artery with a diffusely diseased profunda femoris artery.  The  SFA had about a 70% stenosis in the proximal segment, was diffusely diseased in the mid to distal segment with multiple areas of greater than 50% stenosis, within the popliteal artery occluded just before the knee.  There was occlusion of all 3 tibial vessels with reconstitution of all 3 tibial vessels on proximal occlusions. It was felt that it was in the patient's best interest to proceed with intervention after these images to avoid a second procedure and a larger amount of contrast and fluoroscopy based off of the findings from the initial angiogram.  The patient was systemically heparinized and a 6 [place] Ansell sheath was then placed over the [employer] wire. I then used a Kumpe catheter and the advantage wire to navigate through the popliteal and proximal anterior tibial occlusions and get down into the anterior tibial artery with a CXI catheter beyond the occlusion.  Imaging showed the mid to distal anterior tibial artery to be patent all the way to the foot.  I then placed a 0.018 wire and used a 3 mm diameter by 22 cm length angioplasty balloon from the mid anterior tibial artery up across its origin into the distal popliteal artery.  This was inflated to 8 atm for 1 minute.  A 5 mm diameter by 30 cm length Lutonix drug-coated angioplasty balloon was then inflated from the below-knee popliteal artery up to the mid SFA and taken to 10 atm for 1 minute.  A 5 mm diameter by 10 cm length Lutonix drug-coated angioplasty balloon is inflated in the proximal SFA and inflated to 12 atm for 1 minute.  The SFA looked markedly improved with less than 10% residual stenosis.  The popliteal had about a 20% residual stenosis.  This was markedly improved.  There was still disease at the origin of the anterior tibial artery but I elected to try to cannulate the other tibial vessels to give her at least two-vessel runoff.  Using the Kumpe catheter and the advantage wire was able to cross the tibioperoneal trunk and proximal peroneal occlusion and confirm intraluminal flow in the peroneal artery below the occlusion showing it to be patent distally.  The 0.018 wire was then placed in the diagnostic catheter was removed.  A 3 mm diameter by 22 cm length angioplasty balloon was inflated in the proximal peroneal artery and tibioperoneal trunk and taken up to 12 atm for 1 minute.  Completion imaging showed the tibioperoneal trunk and proximal peroneal artery to have less than 20% residual stenosis, but now the anterior tibial artery had a greater than 70% residual stenosis at  its origin.  Using the Kumpe catheter and the V 18 wire was able to redirect down into the anterior tibial artery and cross the stenosis.  We gently inflated a 4 mm diameter by 6 cm length Lutonix drug-coated angioplasty balloon to 8 atm for 1 minute in the proximal anterior tibial artery.  Completion imaging showed some spasm below this, but only about a 20 to 25% residual stenosis in the proximal anterior tibial artery which was markedly improved.  I elected to terminate the procedure. The sheath was removed and StarClose closure device was deployed in the left femoral artery with excellent hemostatic result. The patient was taken to the recovery room in stable condition having tolerated the procedure well.  Findings:                           Right Lower Extremity:  Normal  common femoral artery with a diffusely diseased profunda femoris artery.  The SFA had about a 70% stenosis in the proximal segment, was diffusely diseased in the mid to distal segment with multiple areas of greater than 50% stenosis, within the popliteal artery occluded just before the knee.  There was occlusion of all 3 tibial vessels with reconstitution of all 3 tibial vessels on proximal occlusions.   Disposition: Patient was taken to the recovery room in stable condition having tolerated the procedure well.  Complications: None  [name] 12/23/2018 11:18 AM   This note was created with Dragon Medical transcription system. Any errors in dictation are purely unintentional.

## 2018-12-23 NOTE — H&P (Signed)
[facility] History & Physical Update  The patient was interviewed and re-examined.  The patient's previous History and Physical has been reviewed and is unchanged.  There is no change in the plan of care. We plan to proceed with the scheduled procedure.  [name], MD  12/23/2018, 8:29 AM

## 2018-12-24 LAB — GLUCOSE, CAPILLARY: Glucose-Capillary: 111 mg/dL — ABNORMAL HIGH (ref 70–99)

## 2018-12-24 NOTE — Anesthesia Postprocedure Evaluation (Signed)
Anesthesia Post Note  Patient: Melinda [name]  Procedure(s) Performed: LOWER EXTREMITY ANGIOGRAPHY (Right )  Patient location during evaluation: PACU Anesthesia Type: General Level of consciousness: awake and alert Pain management: pain level controlled Vital Signs Assessment: post-procedure vital signs reviewed and stable Respiratory status: spontaneous breathing, nonlabored ventilation and respiratory function stable Cardiovascular status: blood pressure returned to baseline and stable Postop Assessment: no apparent nausea or vomiting Anesthetic complications: no     Last Vitals:  Vitals:   12/23/18 1215 12/23/18 1223  BP: 112/69 (!) 106/94  Pulse: 79 80  Resp: 19 11  Temp:  36.4 C  SpO2: 93% 95%    Last Pain:  Vitals:   12/23/18 1300  TempSrc:   PainSc: 0-No pain                 Durenda Hurt

## 2019-01-16 IMAGING — CR DG CHEST 2V
2 series · 2 of 2 positions shown · non-contrast
Comparison: 07/13/2014

CLINICAL DATA: 61-year-old female with a history of preop chest
x-ray

EXAM:
CHEST  2 VIEW

[chest pa]
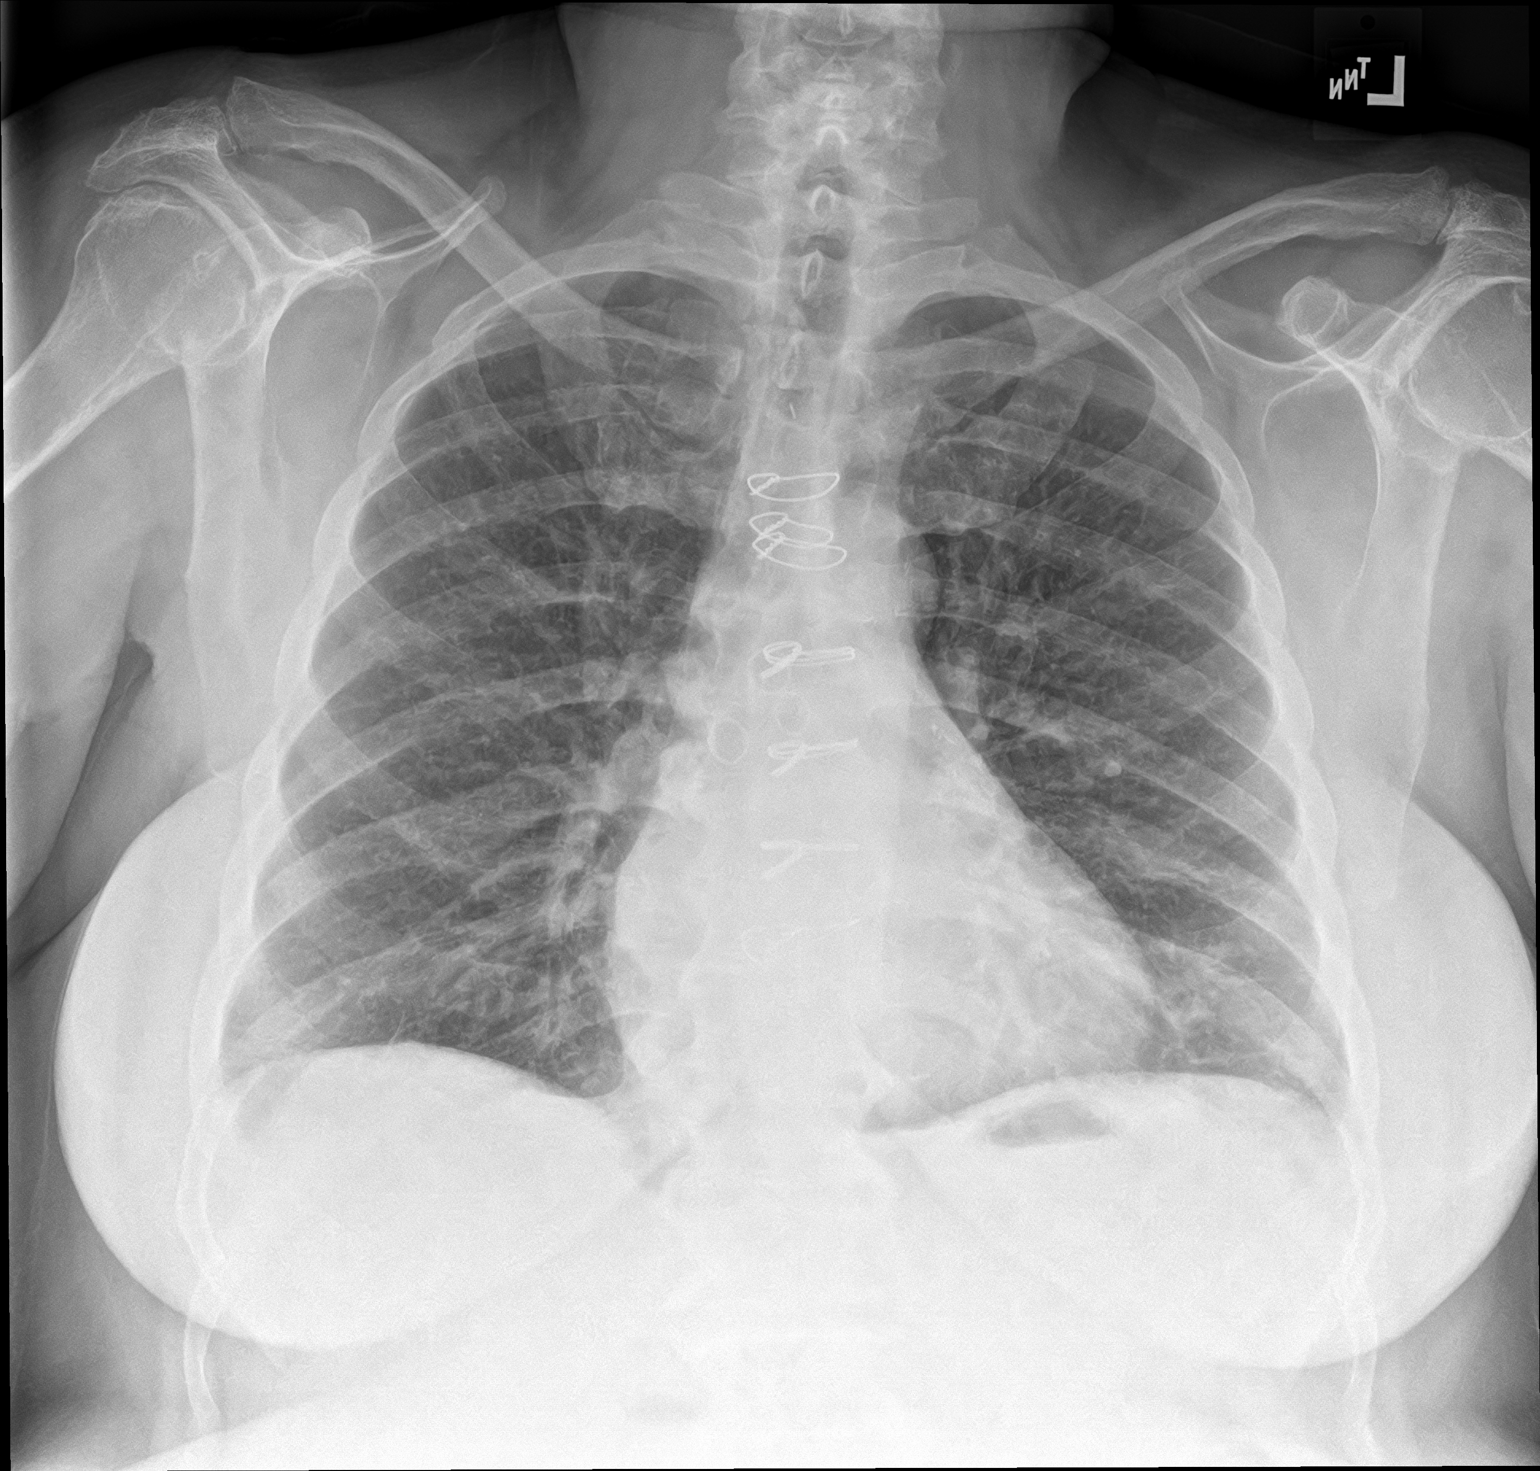

[chest lat]
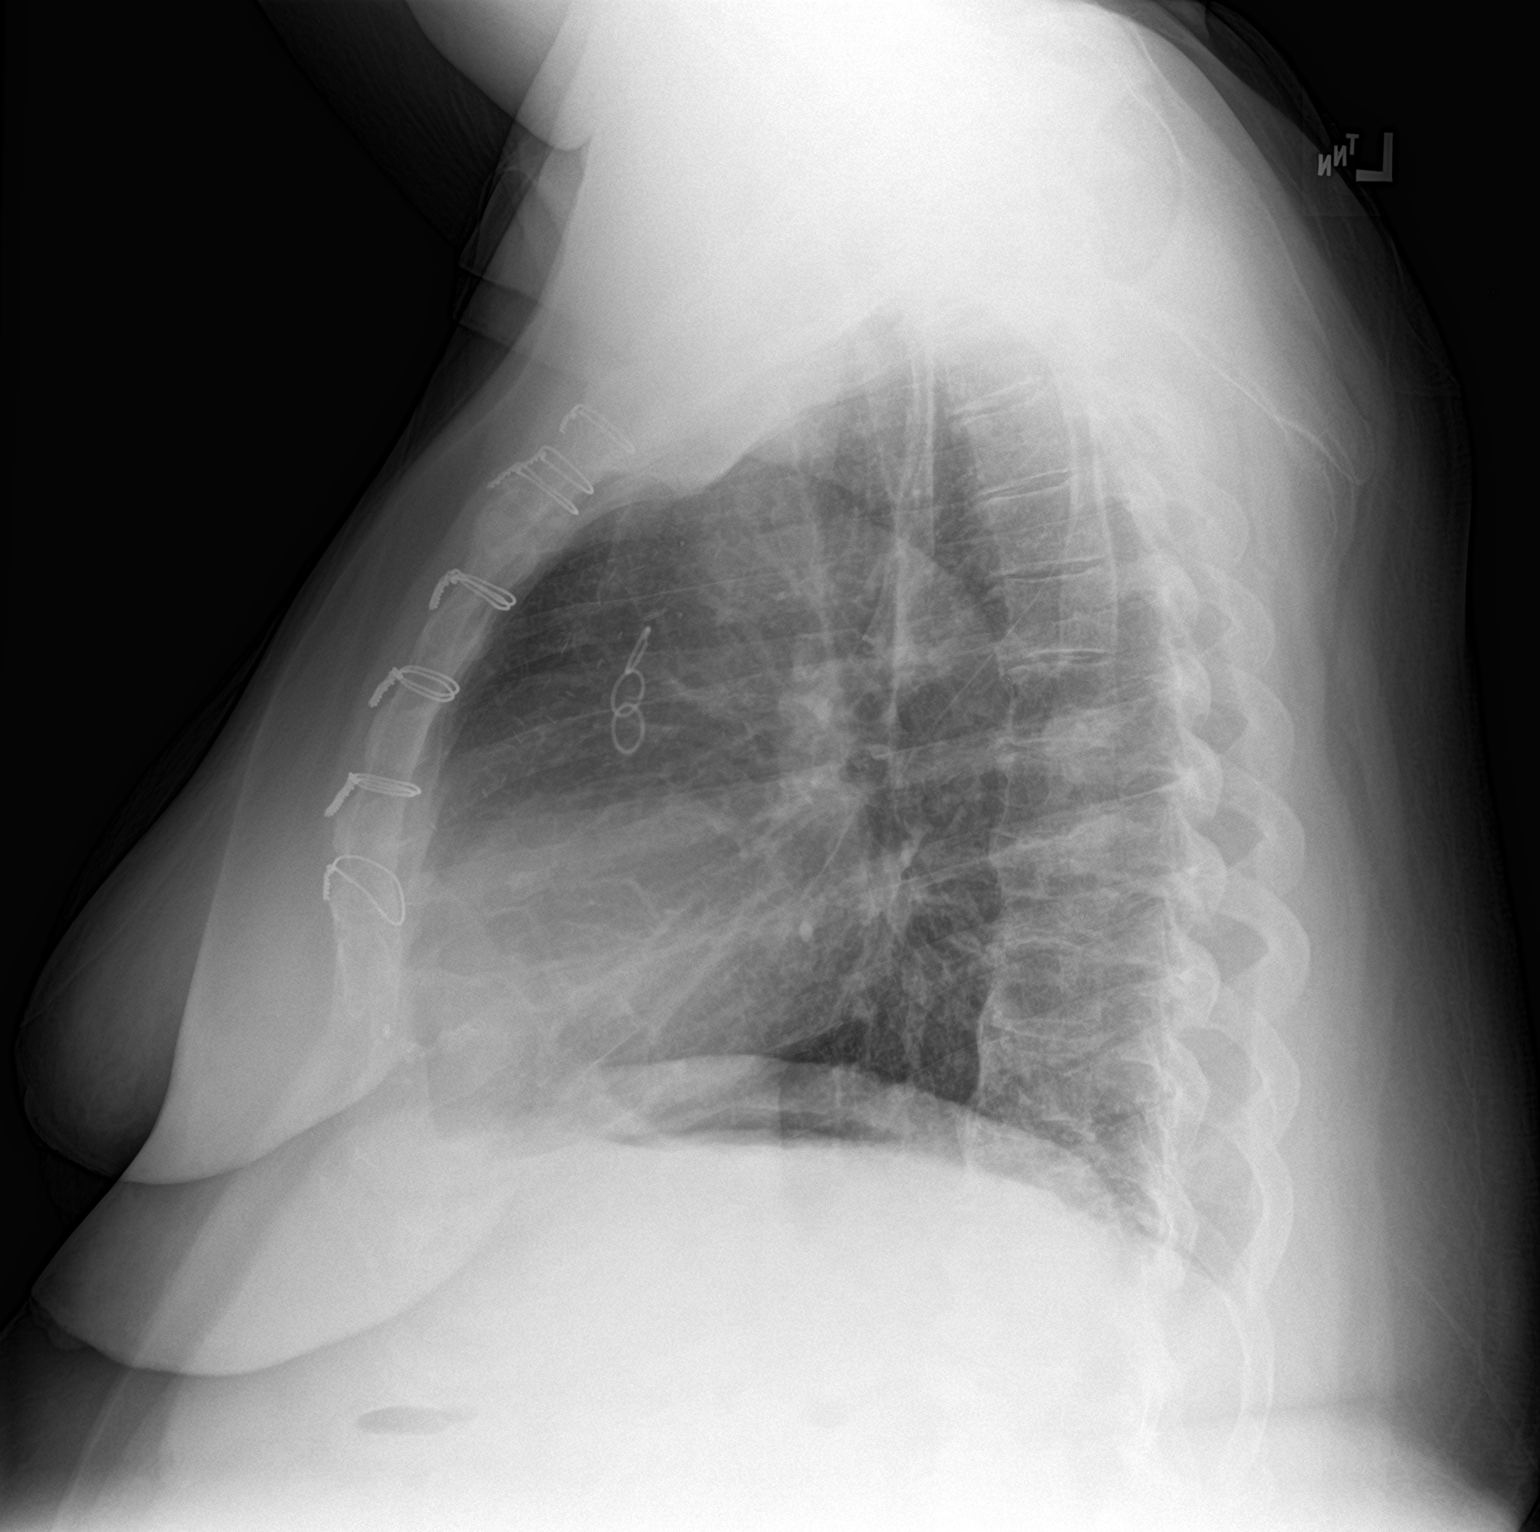

[2 of 2 positions shown; findings below may reference images not displayed]

FINDINGS: Cardiomediastinal silhouette unchanged in size and contour. Surgical
changes of median sternotomy and CABG.

No pneumothorax or pleural effusion.

No confluent airspace disease.

Improved aeration on the left compare to the prior chest x-ray. No
displaced fracture. Multilevel degenerative changes of the spine.
IMPRESSION: No radiographic evidence of acute cardiopulmonary disease.

Surgical changes of prior median sternotomy and CABG

## 2019-01-17 ENCOUNTER — Other Ambulatory Visit: Payer: Self-pay | Admitting: Cardiovascular Disease

## 2019-01-17 DIAGNOSIS — E1159 Type 2 diabetes mellitus with other circulatory complications: Secondary | ICD-10-CM

## 2019-01-21 ENCOUNTER — Other Ambulatory Visit (INDEPENDENT_AMBULATORY_CARE_PROVIDER_SITE_OTHER): Payer: Self-pay | Admitting: Vascular Surgery

## 2019-01-21 ENCOUNTER — Ambulatory Visit (INDEPENDENT_AMBULATORY_CARE_PROVIDER_SITE_OTHER): Payer: Medicare HMO | Admitting: Vascular Surgery

## 2019-01-21 ENCOUNTER — Encounter (INDEPENDENT_AMBULATORY_CARE_PROVIDER_SITE_OTHER): Payer: Medicare HMO

## 2019-01-21 ENCOUNTER — Encounter (INDEPENDENT_AMBULATORY_CARE_PROVIDER_SITE_OTHER): Payer: Self-pay | Admitting: Vascular Surgery

## 2019-01-21 ENCOUNTER — Other Ambulatory Visit: Payer: Self-pay

## 2019-01-21 ENCOUNTER — Ambulatory Visit (INDEPENDENT_AMBULATORY_CARE_PROVIDER_SITE_OTHER): Payer: Medicare HMO

## 2019-01-21 VITALS — BP 148/73 | HR 93 | Resp 16 | Ht 68.5 in | Wt 314.0 lb

## 2019-01-21 DIAGNOSIS — I70239 Atherosclerosis of native arteries of right leg with ulceration of unspecified site: Secondary | ICD-10-CM

## 2019-01-21 DIAGNOSIS — Z95820 Peripheral vascular angioplasty status with implants and grafts: Secondary | ICD-10-CM

## 2019-01-21 DIAGNOSIS — I70249 Atherosclerosis of native arteries of left leg with ulceration of unspecified site: Secondary | ICD-10-CM

## 2019-01-21 DIAGNOSIS — I7025 Atherosclerosis of native arteries of other extremities with ulceration: Secondary | ICD-10-CM | POA: Diagnosis not present

## 2019-01-21 DIAGNOSIS — I10 Essential (primary) hypertension: Secondary | ICD-10-CM | POA: Diagnosis not present

## 2019-01-21 DIAGNOSIS — Z794 Long term (current) use of insulin: Secondary | ICD-10-CM

## 2019-01-21 DIAGNOSIS — Z720 Tobacco use: Secondary | ICD-10-CM

## 2019-01-21 DIAGNOSIS — E1159 Type 2 diabetes mellitus with other circulatory complications: Secondary | ICD-10-CM

## 2019-01-21 NOTE — Progress Notes (Signed)
MRN : [id number]  Melinda [name] is a 63 y.o. ([birth year]) female who presents with chief complaint of  Chief Complaint  Patient presents with  . Follow-up    [facility] 5week post le angio  .  History of Present Illness: Patient returns today in follow up of her PAD.  Her legs are doing much better after bilateral lower extremity revascularizations last month.  Her skin has basically healed.  She still has some neuropathic pain and some mild claudication symptoms but these are much improved.  She had some reperfusion swelling which has subsided now.  Her access sites are well-healed and did not have complications.  Her ABIs went up 27 points bilaterally and are now at 0.71 on the right and 0.88 on the left with biphasic waveforms distally.  Current Outpatient Medications  Medication Sig Dispense Refill  . aspirin 81 MG EC tablet Take 1 tablet (81 mg total) by mouth daily. 30 tablet 0  . atorvastatin (LIPITOR) 80 MG tablet TAKE 1 TABLET EVERY DAY  AT  6  PM 90 tablet 1  . cholecalciferol (VITAMIN D3) 25 MCG (1000 UT) tablet Take 5,000 Units by mouth daily.    . clopidogrel (PLAVIX) 75 MG tablet Take 1 tablet (75 mg total) by mouth daily. 30 tablet 0  . furosemide (LASIX) 20 MG tablet TAKE 1 TABLET (20 MG TOTAL) BY MOUTH DAILY AS NEEDED. 90 tablet 2  . gabapentin (NEURONTIN) 100 MG capsule Take 5 capsules (500 mg total) by mouth 3 (three) times daily. (Patient taking differently: Take 400 mg by mouth 3 (three) times daily. )    . insulin glargine (LANTUS) 100 UNIT/ML injection Inject 0.6 mLs (60 Units total) into the skin 2 (two) times daily.    Marland Kitchen lisinopril (PRINIVIL,ZESTRIL) 5 MG tablet Take 1 tablet (5 mg total) by mouth daily. 30 tablet 0  . metoprolol tartrate (LOPRESSOR) 25 MG tablet Take 0.5 tablets (12.5 mg total) by mouth 2 (two) times daily. (BETA BLOCKER) 180 tablet 3  . Multiple Vitamin (MULTIVITAMIN) tablet Take 1 tablet by mouth daily.    . naproxen (NAPROSYN) 500 MG tablet Take  500 mg by mouth 2 (two) times daily.    Marland Kitchen oxybutynin (DITROPAN) 5 MG tablet Take 5 mg by mouth 2 (two) times daily.     . potassium chloride SA (K-DUR) 20 MEQ tablet TAKE 1 TABLET (20 MEQ TOTAL) BY MOUTH DAILY AS NEEDED. 90 tablet 0  . tiZANidine (ZANAFLEX) 4 MG tablet Take 4 mg by mouth 3 (three) times daily.    Marland Kitchen VICTOZA 18 MG/3ML SOPN     . FLUoxetine (PROZAC) 10 MG capsule Take 1 capsule (10 mg total) by mouth daily. (Patient not taking: Reported on 12/06/2018) 30 capsule 0  . insulin aspart (NOVOLOG) 100 UNIT/ML injection Inject 3 Units into the skin 3 (three) times daily with meals. 2.7 mL 0  . varenicline (CHANTIX CONTINUING MONTH PAK) 1 MG tablet Take 1 tablet (1 mg total) by mouth 2 (two) times daily. (Patient not taking: Reported on 12/23/2018) 180 tablet 0   No current facility-administered medications for this visit.     Past Medical History:  Diagnosis Date  . Anxiety   . Coronary artery disease    a. 06/2014 NSTEMI s/p CABG x 4 (LIMA to LAD, VG to Diag, VG to OM, VG to PDA).  . Diabetic neuropathy ([place])   . GERD (gastroesophageal reflux disease)   . HTN (hypertension)   . Hyperlipidemia LDL goal <  60   . IDDM (insulin dependent diabetes mellitus) ([place])   . Ischemic cardiomyopathy    a. 06/2014 Echo: EF 40-45%, HK of entire inferolateral and inferior myocardium c/w infarct of RCA/LCx, GR2DD, mild MR  . OA (osteoarthritis) of knee   . Obesity   . Osteoarthritis   . Osteomyelitis ([place])   . Peripheral neuropathy   . Peripheral vascular disease ([place])    a. 09/2016 Periph Angio: CTO R popliteal, CTO L prox/mid SFA->Med Rx; b. 05/2017 PTA: LSFA (Viabahn covered stent x 2), DEB to L Post Tibial; c. 06/2017 ABI: R 0.44, L 1.05.  . Tobacco abuse     Past Surgical History:  Procedure Laterality Date  . ABDOMINAL AORTOGRAM W/LOWER EXTREMITY N/A 10/27/2016   Procedure: Abdominal Aortogram w/Lower Extremity;  Surgeon: Nelva Bush, MD;  Location: [place] [facility];  Service:  Cardiovascular;  Laterality: N/A;  . AMPUTATION TOE Left 10/21/2015   Procedure: AMPUTATION TOE;  Surgeon: Sharlotte Alamo, DPM;  Location: [facility];  Service: Podiatry;  Laterality: Left;  . AMPUTATION TOE Left 05/15/2017   Procedure: AMPUTATION LEFT GREAT TOE;  Surgeon: Sharlotte Alamo, DPM;  Location: [facility];  Service: Podiatry;  Laterality: Left;  . AORTIC VALVE REPLACEMENT (AVR)/CORONARY ARTERY BYPASS GRAFTING (CABG)    . BREAST BIOPSY    . CARDIAC CATHETERIZATION  06/2014   95% stenosis mLAD, occlusion ostial OM1, 70% stenosis LCx, 95% stenosis mRCA, EF 45%.  . CORONARY ARTERY BYPASS GRAFT N/A 06/08/2014   Procedure: CORONARY ARTERY BYPASS GRAFTING (CABG);  Surgeon: Grace Isaac, MD;  Location: [place];  Service: Open Heart Surgery;  Laterality: N/A;  Times 4 using left internal mammary artery to LAD artery and endoscopically harvested bilateral saphenous vein to Obtuse Marginal, Diagonal and Posterior Descending coronary arteries.  . CT ABD W & PELVIS WO CM  06/2014   nl liver, gallbladder, spleen, mild diverticular changes, no bowel wall inflammation, appendix nl, no hernia, no other sig abnormalities  . LOWER EXTREMITY ANGIOGRAPHY Left 05/23/2017   Procedure: LOWER EXTREMITY ANGIOGRAPHY;  Surgeon: Algernon Huxley, MD;  Location: [place] [facility];  Service: Cardiovascular;  Laterality: Left;  . LOWER EXTREMITY ANGIOGRAPHY Left 05/28/2017   Procedure: LOWER EXTREMITY ANGIOGRAPHY;  Surgeon: Algernon Huxley, MD;  Location: [place] [facility];  Service: Cardiovascular;  Laterality: Left;  . LOWER EXTREMITY ANGIOGRAPHY Left 12/16/2018   Procedure: LOWER EXTREMITY ANGIOGRAPHY;  Surgeon: Algernon Huxley, MD;  Location: [place] [facility];  Service: Cardiovascular;  Laterality: Left;  . LOWER EXTREMITY ANGIOGRAPHY Right 12/23/2018   Procedure: LOWER EXTREMITY ANGIOGRAPHY;  Surgeon: Algernon Huxley, MD;  Location: [place];  Service: Cardiovascular;  Laterality: Right;  . TEE WITHOUT CARDIOVERSION  N/A 06/08/2014   Procedure: TRANSESOPHAGEAL ECHOCARDIOGRAM (TEE);  Surgeon: Grace Isaac, MD;  Location: [place];  Service: Open Heart Surgery;  Laterality: N/A;  . TOE AMPUTATION Right 12/2011   rt middle toe  . US ECHOCARDIOGRAPHY  06/2014   EF 50-55%, HK of inf/post/inferolat walls, Ao sclerosis    Social History        Tobacco Use  . Smoking status: Current Some Day Smoker    Packs/day: 0.00    Years: 40.00    Pack years: 0.00    Types: Cigarettes  . Smokeless tobacco: Never Used  . Tobacco comment: 1 every 3-4 days  Substance Use Topics  . Alcohol use: No    Alcohol/week: 0.0 standard drinks  . Drug use: Not Currently  Comment: crack    Family History      Family History  Problem Relation Age of Onset  . CAD Mother   . Hypertension Mother   . Diabetes Mother   . CAD Father   . Diabetes Father   . Sickle cell trait Other   . Asthma Other     No Known Allergies   REVIEW OF SYSTEMS(Negative unless checked)  Constitutional: [] ??Weight loss[] ??Fever[] ??Chills Cardiac:[] ??Chest pain[] ??Chest pressure[] ??Palpitations [] ??Shortness of breath when laying flat [] ??Shortness of breath at rest [] ??Shortness of breath with exertion. Vascular: [] ??Pain in legs with walking[] ??Pain in legsat rest[] ??Pain in legs when laying flat [x] ??Claudication [] ??Pain in feet when walking [] ??Pain in feet at rest [] ??Pain in feet when laying flat [] ??History of DVT [] ??Phlebitis [] ??Swelling in legs [] ??Varicose veins [x] ??Non-healing ulcers Pulmonary: [] ??Uses home oxygen [] ??Productive cough[] ??Hemoptysis [] ??Wheeze [] ??COPD [] ??Asthma Neurologic: [] ??Dizziness [] ??Blackouts [] ??Seizures [] ??History of stroke [] ??History of TIA[] ??Aphasia [] ??Temporary blindness[] ??Dysphagia [] ??Weaknessor numbness in arms [] ??Weakness or numbnessin legs Musculoskeletal: [] ??Arthritis [] ??Joint  swelling [] ??Joint pain [] ??Low back pain Hematologic:[] ??Easy bruising[] ??Easy bleeding [] ??Hypercoagulable state [] ??Anemic  Gastrointestinal:[] ??Blood in stool[] ??Vomiting blood[] ??Gastroesophageal reflux/heartburn[] ??Abdominal pain Genitourinary: [] ??Chronic kidney disease [] ??Difficulturination [] ??Frequenturination [] ??Burning with urination[] ??Hematuria Skin: [] ??Rashes [x] ??Ulcers [x] ??Wounds Psychological: [] ??History of anxiety[] ??History of major depression.    Physical Examination  BP (!) 148/73 (BP Location: Right Arm)   Pulse 93   Resp 16   Ht 5' 8.5" (1.74 m)   Wt (!) 314 lb (142.4 kg)   BMI 47.05 kg/m  Gen:  WD/WN, NAD Head: [place]/AT, No temporalis wasting. Ear/Nose/Throat: Hearing grossly intact, nares w/o erythema or drainage Eyes: Conjunctiva clear. Sclera non-icteric Neck: Supple.  Trachea midline Pulmonary:  Good air movement, no use of accessory muscles.  Cardiac: RRR, no JVD Vascular:  Vessel Right Left  Radial Palpable Palpable                          PT  1+ palpable  1+ palpable  DP  1+ palpable Palpable   Gastrointestinal: soft, non-tender/non-distended. No guarding/reflex.  Musculoskeletal: M/S 5/5 throughout.  No deformity or atrophy.  Trace lower extremity edema. Neurologic: Sensation grossly intact in extremities.  Symmetrical.  Speech is fluent.  Psychiatric: Judgment intact, Mood & affect appropriate for pt's clinical situation. Dermatologic: No rashes or ulcers noted.  No cellulitis or open wounds.       Labs Recent Results (from the past 2160 hour(s))  SARS CORONAVIRUS 2 Nasal Swab Aptima Multi Swab     Status: None   Collection Time: 12/12/18 12:47 PM   Specimen: Aptima Multi Swab; Nasal Swab  Result Value Ref Range   SARS Coronavirus 2 NEGATIVE NEGATIVE    Comment: (NOTE) SARS-CoV-2 target nucleic acids are NOT DETECTED. The SARS-CoV-2 RNA is generally detectable in upper and lower  respiratory specimens during the acute phase of infection. Negative results do not preclude SARS-CoV-2 infection, do not rule out co-infections with other pathogens, and should not be used as the sole basis for treatment or other patient management decisions. Negative results must be combined with clinical observations, patient history, and epidemiological information. The expected result is Negative. Fact Sheet for Patients: SugarRoll.be Fact Sheet for Healthcare Providers: https://www.woods-mathews.com/ This test is not yet approved or cleared by the [place] FDA and  has been authorized for detection and/or diagnosis of SARS-CoV-2 by FDA under an Emergency Use Authorization (EUA). This EUA will remain  in effect (meaning this test can be used) for the duration of the COVID-19 declaration under Section 56 4(b)(1)  of the Act, 21 U.S.C. section 360bbb-3(b)(1), unless the authorization is terminated or revoked sooner. Performed at [facility], [place] [address] [place], [place] [address]   Urine Drug Screen, Qualitative ([place] only)     Status: None   Collection Time: 12/16/18  9:34 AM  Result Value Ref Range   Tricyclic, Ur Screen NONE DETECTED NONE DETECTED   Amphetamines, Ur Screen NONE DETECTED NONE DETECTED   MDMA (Ecstasy)Ur Screen NONE DETECTED NONE DETECTED   Cocaine Metabolite,Ur [place] NONE DETECTED NONE DETECTED   Opiate, Ur Screen NONE DETECTED NONE DETECTED   Phencyclidine (PCP) Ur S NONE DETECTED NONE DETECTED   Cannabinoid 50 Ng, Ur [place] NONE DETECTED NONE DETECTED   Barbiturates, Ur Screen NONE DETECTED NONE DETECTED   Benzodiazepine, Ur Scrn NONE DETECTED NONE DETECTED   Methadone Scn, Ur NONE DETECTED NONE DETECTED    Comment: (NOTE) Tricyclics + metabolites, urine    Cutoff 1000 ng/mL Amphetamines + metabolites, urine  Cutoff 1000 ng/mL MDMA (Ecstasy), urine              Cutoff 500 ng/mL Cocaine Metabolite, urine           Cutoff 300 ng/mL Opiate + metabolites, urine        Cutoff 300 ng/mL Phencyclidine (PCP), urine         Cutoff 25 ng/mL Cannabinoid, urine                 Cutoff 50 ng/mL Barbiturates + metabolites, urine  Cutoff 200 ng/mL Benzodiazepine, urine              Cutoff 200 ng/mL Methadone, urine                   Cutoff 300 ng/mL The urine drug screen provides only a preliminary, unconfirmed analytical test result and should not be used for non-medical purposes. Clinical consideration and professional judgment should be applied to any positive drug screen result due to possible interfering substances. A more specific alternate chemical method must be used in order to obtain a confirmed analytical result. Gas chromatography / mass spectrometry (GC/MS) is the preferred confirmat ory method. Performed at [facility], [address] [place], [place] [address]   BUN     Status: Abnormal   Collection Time: 12/16/18  9:34 AM  Result Value Ref Range   BUN 25 (H) 8 - 23 mg/dL    Comment: Performed at [facility], [address] [place], [place] [address]  Creatinine, serum     Status: Abnormal   Collection Time: 12/16/18  9:34 AM  Result Value Ref Range   Creatinine, Ser 1.34 (H) 0.44 - 1.00 mg/dL   GFR calc non Af Amer 41 (L) >60 mL/min   GFR calc Af Amer 48 (L) >60 mL/min    Comment: Performed at [facility], [address] [place], [place] [address]  Glucose, capillary     Status: None   Collection Time: 12/16/18  9:57 AM  Result Value Ref Range   Glucose-Capillary 91 70 - 99 mg/dL  Glucose, capillary     Status: None   Collection Time: 12/16/18 12:51 PM  Result Value Ref Range   Glucose-Capillary 83 70 - 99 mg/dL  SARS CORONAVIRUS 2 Nasal Swab Aptima Multi Swab     Status: None   Collection Time: 12/19/18 12:42 PM   Specimen: Aptima Multi Swab; Nasal Swab  Result Value Ref Range   SARS Coronavirus 2 NEGATIVE  NEGATIVE    Comment: (NOTE)  SARS-CoV-2 target nucleic acids are NOT DETECTED. The SARS-CoV-2 RNA is generally detectable in upper and lower respiratory specimens during the acute phase of infection. Negative results do not preclude SARS-CoV-2 infection, do not rule out co-infections with other pathogens, and should not be used as the sole basis for treatment or other patient management decisions. Negative results must be combined with clinical observations, patient history, and epidemiological information. The expected result is Negative. Fact Sheet for Patients: SugarRoll.be Fact Sheet for Healthcare Providers: https://www.woods-mathews.com/ This test is not yet approved or cleared by the [place] FDA and  has been authorized for detection and/or diagnosis of SARS-CoV-2 by FDA under an Emergency Use Authorization (EUA). This EUA will remain  in effect (meaning this test can be used) for the duration of the COVID-19 declaration under Section 56 4(b)(1) of the Act, 21 U.S.C. section 360bbb-3(b)(1), unless the authorization is terminated or revoked sooner. Performed at [facility], [place] [address] [place], [place] [address]   Glucose, capillary     Status: Abnormal   Collection Time: 12/23/18  8:47 AM  Result Value Ref Range   Glucose-Capillary 110 (H) 70 - 99 mg/dL  BUN     Status: Abnormal   Collection Time: 12/23/18  8:51 AM  Result Value Ref Range   BUN 25 (H) 8 - 23 mg/dL    Comment: Performed at [facility], [address] [place], [place] [address]  Creatinine, serum     Status: Abnormal   Collection Time: 12/23/18  8:51 AM  Result Value Ref Range   Creatinine, Ser 1.45 (H) 0.44 - 1.00 mg/dL   GFR calc non Af Amer 38 (L) >60 mL/min   GFR calc Af Amer 44 (L) >60 mL/min    Comment: Performed at [facility], [address] [place], [place] [address]  Glucose, capillary     Status: Abnormal   Collection Time: 12/23/18 11:29 AM   Result Value Ref Range   Glucose-Capillary 111 (H) 70 - 99 mg/dL    Radiology No results found.  Assessment/Plan HTN (hypertension) blood pressure control important in reducing the progression of atherosclerotic disease. On appropriate oral medications.   DM2 (diabetes mellitus, type 2) blood glucose control important in reducing the progression of atherosclerotic disease. Also, involved in wound healing. On appropriate medications.  Tobacco abuse We had a discussion for approximately 3 minutes regarding the absolute need for smoking cessation due to the deleterious nature of tobacco on the vascular system. We discussed the tobacco use would diminish patency of any intervention, and likely significantly worsen progression of disease. We discussed multiple agents for quitting including replacement therapy or medications to reduce cravings such as Chantix. The patient voices their understanding of the importance of smoking cessation.   Atherosclerosis of native arteries of the extremities with ulceration ([place]) Her ABIs went up 27 points bilaterally and are now at 0.71 on the right and 0.88 on the left with biphasic waveforms distally.  Symptoms much improved after revascularization.  Continue current medical regimen.  Smoking cessation strongly recommended.  Return in 3 months with noninvasive studies    [name], MD  01/21/2019 3:38 PM    This note was created with Dragon medical transcription system.  Any errors from dictation are purely unintentional

## 2019-01-21 NOTE — Assessment & Plan Note (Addendum)
We had a discussion for approximately 3 minutes regarding the absolute need for smoking cessation due to the deleterious nature of tobacco on the vascular system. We discussed the tobacco use would diminish patency of any intervention, and likely significantly worsen progression of disease. We discussed multiple agents for quitting including replacement therapy or medications to reduce cravings such as Chantix. The patient voices their understanding of the importance of smoking cessation.

## 2019-01-21 NOTE — Patient Instructions (Signed)
Peripheral Vascular Disease  Peripheral vascular disease (PVD) is a disease of the blood vessels that are not part of your heart and brain. A simple term for PVD is poor circulation. In most cases, PVD narrows the blood vessels that carry blood from your heart to the rest of your body. This can reduce the supply of blood to your arms, legs, and internal organs, like your stomach or kidneys. However, PVD most often affects a person's lower legs and feet. Without treatment, PVD tends to get worse. PVD can also lead to acute ischemic limb. This is when an arm or leg suddenly cannot get enough blood. This is a medical emergency. Follow these instructions at home: Lifestyle  Do not use any products that contain nicotine or tobacco, such as cigarettes and e-cigarettes. If you need help quitting, ask your doctor.  Lose weight if you are overweight. Or, stay at a healthy weight as told by your doctor.  Eat a diet that is low in fat and cholesterol. If you need help, ask your doctor.  Exercise regularly. Ask your doctor for activities that are right for you. General instructions  Take over-the-counter and prescription medicines only as told by your doctor.  Take good care of your feet: ? Wear comfortable shoes that fit well. ? Check your feet often for any cuts or sores.  Keep all follow-up visits as told by your doctor This is important. Contact a doctor if:  You have cramps in your legs when you walk.  You have leg pain when you are at rest.  You have coldness in a leg or foot.  Your skin changes.  You are unable to get or have an erection (erectile dysfunction).  You have cuts or sores on your feet that do not heal. Get help right away if:  Your arm or leg turns cold, numb, and blue.  Your arms or legs become red, warm, swollen, painful, or numb.  You have chest pain.  You have trouble breathing.  You suddenly have weakness in your face, arm, or leg.  You become very  confused or you cannot speak.  You suddenly have a very bad headache.  You suddenly cannot see. Summary  Peripheral vascular disease (PVD) is a disease of the blood vessels.  A simple term for PVD is poor circulation. Without treatment, PVD tends to get worse.  Treatment may include exercise, low fat and low cholesterol diet, and quitting smoking. This information is not intended to replace advice given to you by your health care provider. Make sure you discuss any questions you have with your health care provider. Document Released: 07/12/2009 Document Revised: 03/30/2017 Document Reviewed: 05/25/2016 Elsevier Patient Education  2020 Elsevier Inc.  

## 2019-01-21 NOTE — Assessment & Plan Note (Signed)
Her ABIs went up 27 points bilaterally and are now at 0.71 on the right and 0.88 on the left with biphasic waveforms distally.  Symptoms much improved after revascularization.  Continue current medical regimen.  Smoking cessation strongly recommended.  Return in 3 months with noninvasive studies

## 2019-02-03 ENCOUNTER — Encounter: Payer: Medicare HMO | Attending: Surgery | Admitting: Dietician

## 2019-02-03 ENCOUNTER — Other Ambulatory Visit: Payer: Self-pay

## 2019-02-03 ENCOUNTER — Encounter: Payer: Self-pay | Admitting: Dietician

## 2019-02-03 VITALS — Ht 68.5 in | Wt 311.6 lb

## 2019-02-03 DIAGNOSIS — L97512 Non-pressure chronic ulcer of other part of right foot with fat layer exposed: Secondary | ICD-10-CM | POA: Diagnosis not present

## 2019-02-03 DIAGNOSIS — E785 Hyperlipidemia, unspecified: Secondary | ICD-10-CM | POA: Diagnosis not present

## 2019-02-03 DIAGNOSIS — E1140 Type 2 diabetes mellitus with diabetic neuropathy, unspecified: Secondary | ICD-10-CM | POA: Insufficient documentation

## 2019-02-03 DIAGNOSIS — Z951 Presence of aortocoronary bypass graft: Secondary | ICD-10-CM | POA: Insufficient documentation

## 2019-02-03 DIAGNOSIS — I10 Essential (primary) hypertension: Secondary | ICD-10-CM | POA: Insufficient documentation

## 2019-02-03 DIAGNOSIS — E11621 Type 2 diabetes mellitus with foot ulcer: Secondary | ICD-10-CM | POA: Diagnosis not present

## 2019-02-03 DIAGNOSIS — Z794 Long term (current) use of insulin: Secondary | ICD-10-CM | POA: Insufficient documentation

## 2019-02-03 DIAGNOSIS — I255 Ischemic cardiomyopathy: Secondary | ICD-10-CM | POA: Diagnosis not present

## 2019-02-03 DIAGNOSIS — I2510 Atherosclerotic heart disease of native coronary artery without angina pectoris: Secondary | ICD-10-CM | POA: Diagnosis not present

## 2019-02-03 DIAGNOSIS — Z6841 Body Mass Index (BMI) 40.0 and over, adult: Secondary | ICD-10-CM | POA: Diagnosis not present

## 2019-02-03 DIAGNOSIS — E1151 Type 2 diabetes mellitus with diabetic peripheral angiopathy without gangrene: Secondary | ICD-10-CM | POA: Diagnosis not present

## 2019-02-03 DIAGNOSIS — I252 Old myocardial infarction: Secondary | ICD-10-CM | POA: Diagnosis not present

## 2019-02-03 DIAGNOSIS — F17200 Nicotine dependence, unspecified, uncomplicated: Secondary | ICD-10-CM | POA: Diagnosis not present

## 2019-02-03 DIAGNOSIS — L97522 Non-pressure chronic ulcer of other part of left foot with fat layer exposed: Secondary | ICD-10-CM | POA: Insufficient documentation

## 2019-02-03 DIAGNOSIS — E1142 Type 2 diabetes mellitus with diabetic polyneuropathy: Secondary | ICD-10-CM | POA: Diagnosis not present

## 2019-02-03 NOTE — Patient Instructions (Signed)
   Reduce sweets gradually until they are not included regularly.   Practice chewing foods thoroughly to help food get down to stomach more easily and smoothly after surgery.   Plan to eat a meal or snack every 3 to 5 hours during the day.

## 2019-02-03 NOTE — Progress Notes (Signed)
Nutrition Assessment  Proposed Surgery: Gastric Bypass   Height: 5'8.5" Weight: 311.6lbs BMI: 46.69 Upper IBW% (UIBW): 199% (IBW 157.3lbs)  Patient's Goal Weight: 170lbs  Medical History: Type 2 diabetes with neuropathy, HTN, HLD, GERD, sleep apnea Medications and Supplements: aspirin, atorvastatin, clopidogrel, FLUoxetine, furosemide, gabapentin, insulin aspart, insulin glargine, lisinopril, metoprolol, naproxen, oxybutynin, potassium chloride, tiZANidine, Victoza  Previous surgeries: 11/2018 stent surgery for arterial circulation in legs Drug allergies: none known Food allergies: no allergies; avoids pork or shellfish due to religous preference Alcohol use: none  Tobacco use: 10 cig. Daily; stopped Chantix due to nightmares  Physical activity: none due to leg, knee pain  Weight history: Childhood: normal weight    Adolescence: gradual weight gain    Adulthood: stocky/ overweight -- about 200lbs for many years    Weight 1 year ago: <300lbs  Dieting/ weight loss history: currently steady weight -- highest weight; has tried multiple diets in the past with only limited success; lost well when avoiding fried foods and added salt (50lbs in 3 months) + walking regularly, gradually drifted away from the healthy habits and regained weight.   Dietary Recall:  Daily pattern: 2 meals and 0-1 snacks. Dining out: 0-1 meals per week. Breakfast: 10-11am biggest meal -- [place] bacon or ham or smoked sausage with eggs + oatmeal or grits  Lunch: chips, pretzels (doesn't really like pretzels); cookies, cake, ice cream, candy Supper: 5-6pm -- sometimes same as am; takeout salad/ leftovers ie chicken and veg; homemade soup Snack(s): none Beverages: water, occasionally regular pepsi  Psychosocial: Emotional eating history: some stress eating -- sweets, chips Disordered eating history: none Patient reports difficulty limiting food intake overall, particularly since pandemic   Intervention:  Patient  decided on bariatric surgery after multiple attempts at weight loss with only limited and short-term success. She is hopting to be able to reduce diabetes meds and leg/knee/ joint pain.  Patient has researched this procedure by consulting with family member who has undergone weight loss surgery.   Instructed her on pre-op diet guidelines, including pre-op (liver reduction) diet.   Discussed stages of the bariatric diet after surgery as well as the importance of adequate protein and fluid intake, and importance of lifelong vitamin supplementation and permanent diet changes.   Summary:  Patient has made diet and lifestyle changes in effort to lose weight and prepare for bariatric surgery; she has undergone 6 months of supervised weight loss.  She has solid support from family members and friends.   She agrees to work on [employer] and eating at regular intervals prior to surgery.   She is motivated to follow the bariatric diet after surgery. From a nutrition standpoint, she is ready to proceed with the bariatric surgery program.    Plan:  Patient commits to returning for pre-op class visit prior to surgery.   She will plan to return for post-op RD visits beginning 2 weeks after surgery.

## 2019-02-26 ENCOUNTER — Ambulatory Visit
Admission: RE | Admit: 2019-02-26 | Discharge: 2019-02-26 | Disposition: A | Discharge status: Home / Self Care | Payer: Medicare HMO | Attending: Internal Medicine | Admitting: Internal Medicine

## 2019-02-26 ENCOUNTER — Other Ambulatory Visit: Payer: Self-pay | Admitting: Internal Medicine

## 2019-02-26 ENCOUNTER — Ambulatory Visit
Admission: RE | Admit: 2019-02-26 | Discharge: 2019-02-26 | Disposition: A | Discharge status: Home / Self Care | Payer: Medicare HMO | Source: Ambulatory Visit | Attending: Internal Medicine | Admitting: Internal Medicine

## 2019-02-26 ENCOUNTER — Encounter: Payer: Medicare HMO | Admitting: Internal Medicine

## 2019-02-26 ENCOUNTER — Other Ambulatory Visit: Payer: Self-pay

## 2019-02-26 DIAGNOSIS — S81801A Unspecified open wound, right lower leg, initial encounter: Secondary | ICD-10-CM

## 2019-02-26 DIAGNOSIS — S81802A Unspecified open wound, left lower leg, initial encounter: Secondary | ICD-10-CM | POA: Insufficient documentation

## 2019-02-26 NOTE — Progress Notes (Signed)
LYNNANN, SARUBBI ([id number]) Visit Report for 02/26/2019 Abuse/Suicide Risk Screen Details Patient Name: Melinda [name], Melinda [name] Date of Service: 02/26/2019 9:45 AM Medical Record Number: [id number] Patient Account Number: [id number] Date of Birth/Sex: Nov 15, 1955 (63 y.o. F) Treating RN: Harold Barban Primary Care Gemini Beaumier: [name] Other Clinician: Referring Aryeh Butterfield: [name] Treating Taveon Enyeart/Extender: Tito Dine in Treatment: 0 Abuse/Suicide Risk Screen Items Answer ABUSE RISK SCREEN: Has anyone close to you tried to hurt or harm you recentlyo No Do you feel uncomfortable with anyone in your familyo No Has anyone forced you do things that you didnot want to doo No Electronic Signature(s) Signed: 02/26/2019 4:41:01 PM By: Harold Barban Entered By: Harold Barban on 02/26/2019 10:01:11 [name] ([id number]) -------------------------------------------------------------------------------- Activities of Daily Living Details Patient Name: [name]. Date of Service: 02/26/2019 9:45 AM Medical Record Number: [id number] Patient Account Number: [id number] Date of Birth/Sex: 03-Oct-1955 (63 y.o. F) Treating RN: Harold Barban Primary Care Rockney Grenz: [name] Other Clinician: Referring Ann Groeneveld: [name] Treating Stephan Nelis/Extender: Tito Dine in Treatment: 0 Activities of Daily Living Items Answer Activities of Daily Living (Please select one for each item) Drive Automobile Completely Able Take Medications Completely Able Use Telephone Completely Able Care for Appearance Completely Able Use Toilet Completely Able Bath / Shower Completely Able Dress Self Completely Able Feed Self Completely Able Walk Completely Able Get In / Out Bed Completely Able Housework Completely Able Prepare Meals Completely Able Handle Money Completely Able Shop for Self Completely Able Electronic Signature(s) Signed: 02/26/2019 4:41:01 PM By: Harold Barban Entered By: Harold Barban on 02/26/2019 10:01:21 [name] ([id number]) -------------------------------------------------------------------------------- Education Screening Details Patient Name: [name]. Date of Service: 02/26/2019 9:45 AM Medical Record Number: [id number] Patient Account Number: [id number] Date of Birth/Sex: 1956-03-30 (63 y.o. F) Treating RN: Harold Barban Primary Care Avery Eustice: [name] Other Clinician: Referring Burech Mcfarland: [name] Treating Jarelyn Bambach/Extender: Tito Dine in Treatment: 0 Primary Learner Assessed: Patient Learning Preferences/Education Level/Primary Language Learning Preference: Explanation Highest Education Level: College or Above Preferred Language: English Cognitive Barrier Language Barrier: No Translator Needed: No Memory Deficit: No Emotional Barrier: No Cultural/Religious Beliefs Affecting Medical Care: No Physical Barrier Impaired Vision: No Impaired Hearing: No Decreased Hand dexterity: No Knowledge/Comprehension Knowledge Level: High Comprehension Level: High Ability to understand written High instructions: Ability to understand verbal High instructions: Motivation Anxiety Level: Calm Cooperation: Cooperative Education Importance: Acknowledges Need Interest in Health Problems: Asks Questions Perception: Coherent Willingness to Engage in Self- High Management Activities: Readiness to Engage in Self- High Management Activities: Electronic Signature(s) Signed: 02/26/2019 4:41:01 PM By: Harold Barban Entered By: Harold Barban on 02/26/2019 10:01:48 [name] ([id number]) -------------------------------------------------------------------------------- Fall Risk Assessment Details Patient Name: [name]. Date of Service: 02/26/2019 9:45 AM Medical Record Number: [id number] Patient Account Number: [id number] Date of Birth/Sex: [birth year] (63 y.o. F) Treating RN:  Harold Barban Primary Care Johnothan Bascomb: [name] Other Clinician: Referring Franck Vinal: [name] Treating Syd Newsome/Extender: Tito Dine in Treatment: 0 Fall Risk Assessment Items Have you had 2 or more falls in the last 12 monthso 0 No Have you had any fall that resulted in injury in the last 12 monthso 0 No FALLS RISK SCREEN History of falling - immediate or within 3 months 0 No Secondary diagnosis (Do you have 2 or more medical diagnoseso) 0 No Ambulatory aid None/bed rest/wheelchair/nurse 0 No Crutches/cane/walker 0 No Furniture 0 No Intravenous therapy Access/Saline/Heparin Lock 0 No Gait/Transferring Normal/ bed rest/ wheelchair 0 No Weak (  short steps with or without shuffle, stooped but able to lift head while 0 No walking, may seek support from furniture) Impaired (short steps with shuffle, may have difficulty arising from chair, head 0 No down, impaired balance) Mental Status Oriented to own ability 0 Yes Electronic Signature(s) Signed: 02/26/2019 4:41:01 PM By: Harold Barban Entered By: Harold Barban on 02/26/2019 10:02:13 [name] ([id number]) -------------------------------------------------------------------------------- Foot Assessment Details Patient Name: [name]. Date of Service: 02/26/2019 9:45 AM Medical Record Number: [id number] Patient Account Number: [id number] Date of Birth/Sex: [birth year] (63 y.o. F) Treating RN: Harold Barban Primary Care Naftoli Penny: [name] Other Clinician: Referring Danielle Mink: [name] Treating Latanza Pfefferkorn/Extender: Tito Dine in Treatment: 0 Foot Assessment Items Site Locations + = Sensation present, - = Sensation absent, C = Callus, U = Ulcer R = Redness, W = Warmth, M = Maceration, PU = Pre-ulcerative lesion F = Fissure, S = Swelling, D = Dryness Assessment Right: Left: Other Deformity: No No Prior Foot Ulcer: No No Prior Amputation: No No Charcot Joint: No  No Ambulatory Status: Ambulatory Without Help Gait: Unsteady Electronic Signature(s) Signed: 02/26/2019 4:41:01 PM By: Harold Barban Entered By: Harold Barban on 02/26/2019 10:03:36 [name] ([id number]) -------------------------------------------------------------------------------- Nutrition Risk Screening Details Patient Name: [name]. Date of Service: 02/26/2019 9:45 AM Medical Record Number: [id number] Patient Account Number: [id number] Date of Birth/Sex: [birth year] (63 y.o. F) Treating RN: Harold Barban Primary Care Aaden Buckman: [name] Other Clinician: Referring Askari Kinley: [name] Treating Cedrick Partain/Extender: Tito Dine in Treatment: 0 Height (in): 68 Weight (lbs): 300 Body Mass Index (BMI): 45.6 Nutrition Risk Screening Items Score Screening NUTRITION RISK SCREEN: I have an illness or condition that made me change the kind and/or amount of 0 No food I eat I eat fewer than two meals per day 0 No I eat few fruits and vegetables, or milk products 0 No I have three or more drinks of beer, liquor or wine almost every day 0 No I have tooth or mouth problems that make it hard for me to eat 0 No I don't always have enough money to buy the food I need 0 No I eat alone most of the time 0 No I take three or more different prescribed or over-the-counter drugs a day 1 Yes Without wanting to, I have lost or gained 10 pounds in the last six months 0 No I am not always physically able to shop, cook and/or feed myself 0 No Nutrition Protocols Good Risk Protocol Moderate Risk Protocol High Risk Proctocol Risk Level: Good Risk Score: 1 Electronic Signature(s) Signed: 02/26/2019 4:41:01 PM By: Harold Barban Entered By: Harold Barban on 02/26/2019 10:02:22

## 2019-02-27 NOTE — Progress Notes (Signed)
[name], DIMITRIOU ([id number]) Visit Report for 02/26/2019 Chief Complaint Document Details Patient Name: Melinda [name], Melinda [name] Date of Service: 02/26/2019 9:45 AM Medical Record Number: [id number] Patient Account Number: [id number] Date of Birth/Sex: December 21, 1955 (63 y.o. F) Treating RN: [name] Primary Care Provider: [name] Other Clinician: Referring Provider: [name] Treating Provider/Extender: Tito Dine in Treatment: 0 Information Obtained from: Patient Chief Complaint 02/26/2019; patient is here for review of wounds on her bilateral plantar feet Electronic Signature(s) Signed: 02/26/2019 5:54:02 PM By: Linton Ham MD Entered By: Linton Ham on 02/26/2019 12:08:07 Melinda [name] ([id number]) -------------------------------------------------------------------------------- Debridement Details Patient Name: Melinda [name] Date of Service: 02/26/2019 9:45 AM Medical Record Number: [id number] Patient Account Number: [id number] Date of Birth/Sex: 27-Dec-1955 (63 y.o. F) Treating RN: [name] Primary Care Provider: [name] Other Clinician: Referring Provider: [name] Treating Provider/Extender: Tito Dine in Treatment: 0 Debridement Performed for Wound #2 Right,Plantar Toe Great Assessment: Performed By: Physician Ricard Dillon, MD Debridement Type: Debridement Severity of Tissue Pre Fat layer exposed Debridement: Level of Consciousness (Pre- Awake and Alert procedure): Pre-procedure Verification/Time Yes - 10:50 Out Taken: Start Time: 10:50 Pain Control: Lidocaine Total Area Debrided (L x W): 0.5 (cm) x 0.4 (cm) = 0.2 (cm) Tissue and other material Viable, Non-Viable, Callus, Subcutaneous debrided: Level: Skin/Subcutaneous Tissue Debridement Description: Excisional Instrument: Blade, Scissors Bleeding: Large Hemostasis Achieved: Pressure End Time: 10:53 Response to Treatment: Procedure was tolerated  well Level of Consciousness Awake and Alert (Post-procedure): Post Debridement Measurements of Total Wound Length: (cm) 0.5 Width: (cm) 0.4 Depth: (cm) 0.7 Volume: (cm) 0.11 Character of Wound/Ulcer Post Debridement: Stable Severity of Tissue Post Debridement: Fat layer exposed Post Procedure Diagnosis Same as Pre-procedure Electronic Signature(s) Signed: 02/26/2019 5:54:02 PM By: Linton Ham MD Signed: 02/27/2019 10:04:30 AM By: [name], [name], RN, [name], [name] RN, [name] Entered By: [name], [name], RN, [name], [name] on 02/26/2019 10:53:41 Melinda [name] ([id number]) -------------------------------------------------------------------------------- Debridement Details Patient Name: Melinda, [name] Date of Service: 02/26/2019 9:45 AM Medical Record Number: [id number] Patient Account Number: [id number] Date of Birth/Sex: 03-12-1956 (63 y.o. F) Treating RN: [name] Primary Care Provider: [name] Other Clinician: Referring Provider: [name] Treating Provider/Extender: Tito Dine in Treatment: 0 Debridement Performed for Wound #1 Left,Plantar Toe Great Assessment: Performed By: Physician Ricard Dillon, MD Debridement Type: Debridement Severity of Tissue Pre Fat layer exposed Debridement: Level of Consciousness (Pre- Awake and Alert procedure): Pre-procedure Verification/Time Yes - 10:50 Out Taken: Start Time: 10:50 Pain Control: Lidocaine Total Area Debrided (L x W): 2.1 (cm) x 1.9 (cm) = 3.99 (cm) Tissue and other material Viable, Non-Viable, Callus, Subcutaneous debrided: Level: Skin/Subcutaneous Tissue Debridement Description: Excisional Instrument: Blade, Scissors Bleeding: Large Hemostasis Achieved: Pressure End Time: 10:53 Response to Treatment: Procedure was tolerated well Level of Consciousness Awake and Alert (Post-procedure): Post Debridement Measurements of Total Wound Length: (cm) 2.1 Width: (cm) 1.9 Depth: (cm) 0.7 Volume: (cm)  2.194 Character of Wound/Ulcer Post Debridement: Stable Severity of Tissue Post Debridement: Fat layer exposed Post Procedure Diagnosis Same as Pre-procedure Electronic Signature(s) Signed: 02/26/2019 5:54:02 PM By: Linton Ham MD Signed: 02/27/2019 10:04:30 AM By: [name], [name], RN, [name], [name] RN, [name] Entered By: [name], [name], RN, [name], [name] on 02/26/2019 10:54:43 Melinda [name] ([id number]) -------------------------------------------------------------------------------- HPI Details Patient Name: Melinda, [name] Date of Service: 02/26/2019 9:45 AM Medical Record Number: [id number] Patient Account Number: [id number] Date of Birth/Sex: 10-22-1955 (63 y.o. F) Treating RN: [name] Primary Care Provider: [name]  Other Clinician: Referring Provider: [name] Treating Provider/Extender: Ricard Dillon Weeks in Treatment: 0 History of Present Illness HPI Description: ADMISSION 02/26/2019 Patient is a 63 year old type II diabetic on insulin with significant polyneuropathy. She has been followed by Dr. Sherren Mocha cline of podiatry for problems related to her feet dating back to the early part of 2019 as I can review in  link. 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 Subsequently she was seen by Dr. Lucky Cowboy of vascular surgery and had stents x2 placed in her left SFA as well as left SFA angioplasties on 05/20/2017. She was noted to have a wound on her left foot in October 2019. In August 2020 on 8/24 she underwent a right anterior tibial artery angioplasty a right tibioperoneal trunk angioplasty and a right SFA angioplasty. The patient states that she developed a left great toe wound in August which is at the base of her previous partial amputation in this area. She tells [name] that she has had a right great toe wound since December 2019 and she has been using Santyl to both of these areas that she received from a fellow parishioner at her  church. By enlarge she has been using Neosporin to these areas and not offloading them specifically Arterial studies on 9/22 showed an ABI on the right of 0.71 with triphasic waveforms on the left at 0.88 with triphasic and biphasic waveforms. TBI's on the right and 0.44 and on the left at 1.05. 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 Electronic Signature(s) Signed: 02/26/2019 5:54:02 PM By: Linton Ham MD Entered By: Linton Ham on 02/26/2019 12:12:10 Melinda [name] ([id number]) -------------------------------------------------------------------------------- Physical Exam Details Patient Name: JACOBA, BELKEN. Date of Service: 02/26/2019 9:45 AM Medical Record Number: [id number] Patient Account Number: [id number] Date of Birth/Sex: 18-Jun-1955 (63 y.o. F) Treating RN: [name] Primary Care Provider: [name] Other Clinician: Referring Provider: [name] Treating Provider/Extender: Tito Dine in Treatment: 0 Constitutional Sitting or standing Blood Pressure is within target range for patient.. Pulse regular and within target range for patient.Marland Kitchen Respirations regular, non-labored and within target range.. Temperature is normal and within the target range for the patient.Marland Kitchen appears in no distress. Eyes Conjunctivae clear. No discharge. Respiratory Respiratory effort is easy and symmetric bilaterally. Rate is normal at rest and on room air.. Bilateral breath sounds are clear and equal in all lobes with no wheezes, rales or rhonchi.. Cardiovascular Heart rhythm and rate regular, without murmur or gallop.. Popliteal and femoral pulses palpable. Pedal pulses are easily palpable at the dorsalis pedis and posterior tibia. Gastrointestinal (GI) Nontender obese. Lymphatic None palpable in the  popliteal or inguinal area. Musculoskeletal Partial amputation of the left first toe, total left third on both sides. Neurological Completely absent sensation to the microfilament and vibration.Marland Kitchen Psychiatric No evidence of depression, anxiety, or agitation. Calm, cooperative, and communicative. Appropriate interactions and affect.. Notes Wound exam; the patient has a large wound at the base of the remanent of her left first toe. Extensive callus and subcutaneous tissue removed from the wound edge to get a clean looking wound bed that does not have undermining. Hemostasis with silver nitrate oOn the right a smaller area again debrided with pickups and a #10 scalpel removing callus subcutaneous tissue and necrotic debris from the wound surface. This is a smaller  but deeper wound. oNeither wound has any evidence of infection and no palpable bone Electronic Signature(s) Signed: 02/26/2019 5:54:02 PM By: Linton Ham MD Entered By: Linton Ham on 02/26/2019 12:14:35 Melinda [name] ([id number]) -------------------------------------------------------------------------------- Physician Orders Details Patient Name: Melinda [name] Date of Service: 02/26/2019 9:45 AM Medical Record Number: [id number] Patient Account Number: [id number] Date of Birth/Sex: Feb 23, 1956 (63 y.o. F) Treating RN: [name] Primary Care Provider: [name] Other Clinician: Referring Provider: [name] Treating Provider/Extender: Tito Dine in Treatment: 0 Verbal / Phone Orders: No Diagnosis Coding Wound Cleansing Wound #1 Left,Plantar Toe Great o Cleanse wound with mild soap and water o No tub bath. Wound #2 Right,Plantar Toe Great o Cleanse wound with mild soap and water o No tub bath. Anesthetic (add to Medication List) Wound #1 Left,Plantar Toe Great o Topical Lidocaine 4% cream applied to wound bed prior to debridement (In Clinic Only). Wound #2 Right,Plantar Toe  Great o Topical Lidocaine 4% cream applied to wound bed prior to debridement (In Clinic Only). Primary Wound Dressing Wound #1 Left,Plantar Toe Great o Silver Alginate Wound #2 Right,Plantar Toe Great o Silver Alginate Secondary Dressing Wound #1 Left,Plantar Toe Great o Gauze and Kerlix/Conform Wound #2 Right,Plantar Toe Great o Gauze and Kerlix/Conform Dressing Change Frequency Wound #1 Left,Plantar Toe Great o Change dressing every other day. Wound #2 Right,Plantar Toe Great o Change dressing every other day. Follow-up Appointments Wound #1 Left,Plantar Toe Great o Return Appointment in 1 week. Wound #2 Right,Plantar Toe Great o Return Appointment in 1 week. KMIYAH, GOLUB ([id number]) Edema Control Wound #1 Left,Plantar Toe Great o Elevate legs to the level of the heart and pump ankles as often as possible Wound #2 Right,Plantar Toe Great o Elevate legs to the level of the heart and pump ankles as often as possible Off-Loading Wound #1 Left,Plantar Toe Great o Open toe surgical shoe with peg assist. - Bilateral Wound #2 Right,Plantar Toe Great o Open toe surgical shoe with peg assist. - Bilateral Additional Orders / Instructions Wound #1 Left,Plantar Toe Great o Stop Smoking o Increase protein intake. Wound #2 Right,Plantar Toe Great o Stop Smoking o Increase protein intake. Radiology o X-ray, foot - Bilateral Feet Electronic Signature(s) Signed: 02/26/2019 5:54:02 PM By: Linton Ham MD Signed: 02/27/2019 10:04:30 AM By: [name], [name], RN, [name], [name] RN, [name] Entered By: [name], [name], RN, [name], [name] on 02/26/2019 11:01:49 Melinda [name] ([id number]) -------------------------------------------------------------------------------- Problem List Details Patient Name: BERDIA, SCHECHTER. Date of Service: 02/26/2019 9:45 AM Medical Record Number: [id number] Patient Account Number: [id number] Date of Birth/Sex: 1955/12/21 (63 y.o.  F) Treating RN: [name] Primary Care Provider: [name] Other Clinician: Referring Provider: [name] Treating Provider/Extender: Tito Dine in Treatment: 0 Active Problems ICD-10 Evaluated Encounter Code Description Active Date Today Diagnosis E11.621 Type 2 diabetes mellitus with foot ulcer 02/26/2019 No Yes L97.512 Non-pressure chronic ulcer of other part of right foot with fat 02/26/2019 No Yes layer exposed L97.522 Non-pressure chronic ulcer of other part of left foot with fat 02/26/2019 No Yes layer exposed E11.51 Type 2 diabetes mellitus with diabetic peripheral angiopathy 02/26/2019 No Yes without gangrene E11.42 Type 2 diabetes mellitus with diabetic polyneuropathy 02/26/2019 No Yes Inactive Problems Resolved Problems Electronic Signature(s) Signed: 02/26/2019 5:54:02 PM By: Linton Ham MD Entered By: Linton Ham on 02/26/2019 11:07:45 Melinda [name] ([id number]) -------------------------------------------------------------------------------- Progress Note Details Patient Name: Melinda [name] Date of Service: 02/26/2019 9:45 AM Medical Record Number: [id number] Patient Account Number: [id number]  Date of Birth/Sex: 1956-01-21 (63 y.o. 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 She has been followed by Dr. Sherren Mocha cline of podiatry for problems related to her feet dating back to the early part of 2019 as I can review in [place] link. 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  Subsequently she was seen by Dr. Lucky Cowboy of vascular surgery and had stents x2 placed in her left SFA as well as left SFA angioplasties on 05/20/2017. She was noted to have a wound on her left foot in October 2019. In August 2020 on 8/24 she underwent a right anterior tibial artery angioplasty a right tibioperoneal trunk angioplasty and a right SFA angioplasty. The patient states that she developed a left great toe wound in August which is at the base of her previous partial amputation in this area. She tells [name] that she has had a right great toe wound since December 2019 and she has been using Santyl to both of these areas that she received from a fellow parishioner at her church. By enlarge she has been using Neosporin to these areas and not offloading them specifically Arterial studies on 9/22 showed an ABI on the right of 0.71 with triphasic waveforms on the left at 0.88 with triphasic and biphasic waveforms. TBI's on the right and 0.44 and on the left at 1.05. 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 Patient History Information obtained from Patient. Allergies No Known Drug Allergies Family History Diabetes - Mother,Father, Heart Disease - Mother,Father, Hypertension - Mother, No family history of Cancer, Hereditary Spherocytosis, Kidney Disease, Lung Disease, Seizures, Stroke, Thyroid Problems, Tuberculosis. Social History Current every day smoker - 49 years, Alcohol Use - Never, Drug Use - No History, Caffeine Use - Daily. Medical History Eyes Patient has history of Cataracts Denies history of Glaucoma DIONDRA, HAAR ([id number]) Ear/Nose/Mouth/Throat Denies history of Chronic sinus problems/congestion, Middle ear problems Hematologic/Lymphatic Denies history of Anemia, Hemophilia, Human Immunodeficiency Virus,  Lymphedema, Sickle Cell Disease Respiratory Denies history of Aspiration, Asthma, Chronic Obstructive Pulmonary Disease (COPD), Pneumothorax, Sleep Apnea, Tuberculosis Cardiovascular Patient has history of Coronary Artery Disease, Hypertension, Myocardial Infarction, Peripheral Venous Disease Denies history of Angina, Arrhythmia, Congestive Heart Failure, Deep Vein Thrombosis, Hypotension, Phlebitis, Vasculitis Gastrointestinal Denies history of Cirrhosis , Colitis, Crohn s, Hepatitis A, Hepatitis B, Hepatitis C Endocrine Patient has history of Type II Diabetes - IDDM Denies history of Type I Diabetes Genitourinary Denies history of End Stage Renal Disease Immunological Denies history of Lupus Erythematosus, Raynaud s, Scleroderma Integumentary (Skin) Denies history of History of Burn, History of pressure wounds Musculoskeletal Patient has history of Osteoarthritis - knee bilateral, Osteomyelitis Neurologic Patient has history of Neuropathy - peripheral Denies history of Dementia, Quadriplegia, Paraplegia, Seizure Disorder Patient is treated with Insulin. Blood sugar is tested. Blood sugar results noted at the following times: Breakfast - 82. Medical And Surgical History Notes Cardiovascular ischemic cardiomyopathy, hyperlipidemia Review of Systems (ROS) Constitutional Symptoms (General Health) Denies complaints or symptoms of Fatigue, Fever, Chills,  Marked Weight Change. Eyes Denies complaints or symptoms of Dry Eyes, Vision Changes, Glasses / Contacts. Ear/Nose/Mouth/Throat Denies complaints or symptoms of Difficult clearing ears, Sinusitis. Hematologic/Lymphatic Denies complaints or symptoms of Bleeding / Clotting Disorders, Human Immunodeficiency Virus. Respiratory Complains or has symptoms of Shortness of Breath - during activity. Denies complaints or symptoms of Chronic or frequent coughs. Cardiovascular Complains or has symptoms of LE edema. Gastrointestinal Denies  complaints or symptoms of Frequent diarrhea, Nausea, Vomiting. Endocrine Denies complaints or symptoms of Hepatitis, Thyroid disease, Polydypsia (Excessive Thirst). Genitourinary Denies complaints or symptoms of Kidney failure/ Dialysis, Incontinence/dribbling. Immunological Denies complaints or symptoms of Hives, Itching. Integumentary (Skin) Complains or has symptoms of Wounds - 2. Denies complaints or symptoms of Bleeding or bruising tendency, Breakdown, Swelling. Musculoskeletal Denies complaints or symptoms of Muscle Pain, Muscle Weakness. JANEYA, BENTLE ([id number]) Neurologic Denies complaints or symptoms of Numbness/parasthesias, Focal/Weakness. Objective Constitutional Sitting or standing Blood Pressure is within target range for patient.. Pulse regular and within target range for patient.Marland Kitchen Respirations regular, non-labored and within target range.. Temperature is normal and within the target range for the patient.Marland Kitchen appears in no distress. Vitals Time Taken: 9:44 AM, Height: 68 in, Source: Stated, Weight: 300 lbs, Source: Stated, BMI: 45.6, Temperature: 98.8 F, Pulse: 88 bpm, Respiratory Rate: 18 breaths/min, Blood Pressure: 141/58 mmHg. Eyes Conjunctivae clear. No discharge. Respiratory Respiratory effort is easy and symmetric bilaterally. Rate is normal at rest and on room air.. Bilateral breath sounds are clear and equal in all lobes with no wheezes, rales or rhonchi.. Cardiovascular Heart rhythm and rate regular, without murmur or gallop.. Popliteal and femoral pulses palpable. Pedal pulses are easily palpable at the dorsalis pedis and posterior tibia. Gastrointestinal (GI) Nontender obese. Lymphatic None palpable in the popliteal or inguinal area. Musculoskeletal Partial amputation of the left first toe, total left third on both sides. Neurological Completely absent sensation to the microfilament and vibration.Marland Kitchen Psychiatric No evidence of depression, anxiety,  or agitation. Calm, cooperative, and communicative. Appropriate interactions and affect.. General Notes: Wound exam; the patient has a large wound at the base of the remanent of her left first toe. Extensive callus and subcutaneous tissue removed from the wound edge to get a clean looking wound bed that does not have undermining. Hemostasis with silver nitrate On the right a smaller area again debrided with pickups and a #10 scalpel removing callus subcutaneous tissue and necrotic debris from the wound surface. This is a smaller but deeper wound. Neither wound has any evidence of infection and no palpable bone Integumentary (Hair, Skin) Wound #1 status is Open. Original cause of wound was Gradually Appeared. The wound is located on the [employer]. The wound measures 2.1cm length x 1.9cm width x 0.5cm depth; 3.134cm^2 area and 1.567cm^3 volume. There is Fat Layer (Subcutaneous Tissue) Exposed exposed. There is no tunneling or undermining noted. There is a medium amount Kovacich, Rhianon E. ([id number]) of serosanguineous drainage noted. The wound margin is epibole. There is small (1-33%) pale granulation within the wound bed. There is a large (67-100%) amount of necrotic tissue within the wound bed including Adherent Slough. Wound #2 status is Open. Original cause of wound was Gradually Appeared. The wound is located on the [employer]. The wound measures 0.4cm length x 0.3cm width x 0.7cm depth; 0.094cm^2 area and 0.066cm^3 volume. There is Fat Layer (Subcutaneous Tissue) Exposed exposed. There is no tunneling or undermining noted. There is a medium amount of serosanguineous drainage noted. The wound margin is epibole. There  is small (1-33%) pale granulation within the wound bed. There is a large (67-100%) amount of necrotic tissue within the wound bed including Adherent Slough. Assessment Active Problems ICD-10 Type 2 diabetes mellitus with foot ulcer Non-pressure chronic  ulcer of other part of right foot with fat layer exposed Non-pressure chronic ulcer of other part of left foot with fat layer exposed Type 2 diabetes mellitus with diabetic peripheral angiopathy without gangrene Type 2 diabetes mellitus with diabetic polyneuropathy Procedures Wound #1 Pre-procedure diagnosis of Wound #1 is a Diabetic Wound/Ulcer of the Lower Extremity located on the Left,Plantar Toe Great .Severity of Tissue Pre Debridement is: Fat layer exposed. There was a Excisional Skin/Subcutaneous Tissue Debridement with a total area of 3.99 sq cm performed by Ricard Dillon, MD. With the following instrument(s): Blade, and Scissors to remove Viable and Non-Viable tissue/material. Material removed includes Callus and Subcutaneous Tissue and after achieving pain control using Lidocaine. No specimens were taken. A time out was conducted at 10:50, prior to the start of the procedure. A Large amount of bleeding was controlled with Pressure. The procedure was tolerated well. Post Debridement Measurements: 2.1cm length x 1.9cm width x 0.7cm depth; 2.194cm^3 volume. Character of Wound/Ulcer Post Debridement is stable. Severity of Tissue Post Debridement is: Fat layer exposed. Post procedure Diagnosis Wound #1: Same as Pre-Procedure Wound #2 Pre-procedure diagnosis of Wound #2 is a Diabetic Wound/Ulcer of the Lower Extremity located on the Right,Plantar Toe Great .Severity of Tissue Pre Debridement is: Fat layer exposed. There was a Excisional Skin/Subcutaneous Tissue Debridement with a total area of 0.2 sq cm performed by Ricard Dillon, MD. With the following instrument(s): Blade, and Scissors to remove Viable and Non-Viable tissue/material. Material removed includes Callus and Subcutaneous Tissue and after achieving pain control using Lidocaine. No specimens were taken. A time out was conducted at 10:50, prior to the start of the procedure. A Large amount of bleeding was controlled with  Pressure. The procedure was tolerated well. Post Debridement Measurements: 0.5cm length x 0.4cm width x 0.7cm depth; 0.11cm^3 volume. Character of Wound/Ulcer Post Debridement is stable. Severity of Tissue Post Debridement is: Fat layer exposed. Post procedure Diagnosis Wound #2: Same as Pre-Procedure MAKYLIE, UHLES E. ([id number]) Plan Wound Cleansing: Wound #1 Left,Plantar Toe Great: Cleanse wound with mild soap and water No tub bath. Wound #2 Right,Plantar Toe Great: Cleanse wound with mild soap and water No tub bath. Anesthetic (add to Medication List): Wound #1 Left,Plantar Toe Great: Topical Lidocaine 4% cream applied to wound bed prior to debridement (In Clinic Only). Wound #2 Right,Plantar Toe Great: Topical Lidocaine 4% cream applied to wound bed prior to debridement (In Clinic Only). Primary Wound Dressing: Wound #1 Left,Plantar Toe Great: Silver Alginate Wound #2 Right,Plantar Toe Great: Silver Alginate Secondary Dressing: Wound #1 Left,Plantar Toe Great: Gauze and Kerlix/Conform Wound #2 Right,Plantar Toe Great: Gauze and Kerlix/Conform Dressing Change Frequency: Wound #1 Left,Plantar Toe Great: Change dressing every other day. Wound #2 Right,Plantar Toe Great: Change dressing every other day. Follow-up Appointments: Wound #1 Left,Plantar Toe Great: Return Appointment in 1 week. Wound #2 Right,Plantar Toe Great: Return Appointment in 1 week. Edema Control: Wound #1 Left,Plantar Toe Great: Elevate legs to the level of the heart and pump ankles as often as possible Wound #2 Right,Plantar Toe Great: Elevate legs to the level of the heart and pump ankles as often as possible Off-Loading: Wound #1 Left,Plantar Toe Great: Open toe surgical shoe with peg assist. - Bilateral Wound #2 Right,Plantar Toe Great: Open toe  surgical shoe with peg assist. - Bilateral Additional Orders / Instructions: Wound #1 Left,Plantar Toe Great: Stop Smoking Increase protein  intake. Wound #2 Right,Plantar Toe Great: Stop Smoking Increase protein intake. Radiology ordered were: X-ray, foot - Bilateral Feet Scrivens, Shann E. ([id number]) 1. Patient with bilateral foot wounds as described. She will need bilateral x-rays has the wounds have some chronicity although there is no current evidence of infection 2. She has known PAD although her recent ABIs were improvement from I think preintervention ABIs especially on the right. She is felt to have moderate right lower extremity arterial disease and mild left lower extremity arterial disease. But this is based on the most most recent arterial studies on 9/22. Currently her pulses are palpable her feet are warm 3. She has known diabetic peripheral neuropathy which is severe. She also has gait and balance problems she has a cane and a walker but does not use them. It will be difficult to offload these wounds more aggressively than bilateral Pegasys shoes 4. Extensive debridements done but no culture no evidence of infection 5. It is not likely that we would be able to more aggressively offload these wounds because of gait and balance issues that the patient describes 6. We will use silver alginate as the primary dressing to both wound areas for now Electronic Signature(s) Signed: 02/26/2019 5:54:02 PM By: Linton Ham MD Entered By: Linton Ham on 02/26/2019 12:17:47 Melinda [name] ([id number]) -------------------------------------------------------------------------------- ROS/PFSH Details Patient Name: Melinda [name] Date of Service: 02/26/2019 9:45 AM Medical Record Number: [id number] Patient Account Number: [id number] Date of Birth/Sex: 03/24/1956 (63 y.o. F) Treating RN: Harold Barban Primary Care Provider: [name] Other Clinician: Referring Provider: [name] Treating Provider/Extender: Tito Dine in Treatment: 0 Information Obtained From Patient Constitutional Symptoms  (General Health) Complaints and Symptoms: Negative for: Fatigue; Fever; Chills; Marked Weight Change Eyes Complaints and Symptoms: Negative for: Dry Eyes; Vision Changes; Glasses / Contacts Medical History: Positive for: Cataracts Negative for: Glaucoma Ear/Nose/Mouth/Throat Complaints and Symptoms: Negative for: Difficult clearing ears; Sinusitis Medical History: Negative for: Chronic sinus problems/congestion; Middle ear problems Hematologic/Lymphatic Complaints and Symptoms: Negative for: Bleeding / Clotting Disorders; Human Immunodeficiency Virus Medical History: Negative for: Anemia; Hemophilia; Human Immunodeficiency Virus; Lymphedema; Sickle Cell Disease Respiratory Complaints and Symptoms: Positive for: Shortness of Breath - during activity Negative for: Chronic or frequent coughs Medical History: Negative for: Aspiration; Asthma; Chronic Obstructive Pulmonary Disease (COPD); Pneumothorax; Sleep Apnea; Tuberculosis Cardiovascular Complaints and Symptoms: Positive for: LE edema Medical History: Positive for: Coronary Artery Disease; Hypertension; Myocardial Infarction; Peripheral Venous Disease Negative for: Angina; Arrhythmia; Congestive Heart Failure; Deep Vein Thrombosis; Hypotension; Phlebitis; Vasculitis BLAIKLEY, LEGROW ([id number]) Past Medical History Notes: ischemic cardiomyopathy, hyperlipidemia Gastrointestinal Complaints and Symptoms: Negative for: Frequent diarrhea; Nausea; Vomiting Medical History: Negative for: Cirrhosis ; Colitis; Crohnos; Hepatitis A; Hepatitis B; Hepatitis C Endocrine Complaints and Symptoms: Negative for: Hepatitis; Thyroid disease; Polydypsia (Excessive Thirst) Medical History: Positive for: Type II Diabetes - IDDM Negative for: Type I Diabetes Time with diabetes: 12 years Treated with: Insulin Blood sugar tested every day: Yes Tested : Blood sugar testing results: Breakfast: 82 Genitourinary Complaints and  Symptoms: Negative for: Kidney failure/ Dialysis; Incontinence/dribbling Medical History: Negative for: End Stage Renal Disease Immunological Complaints and Symptoms: Negative for: Hives; Itching Medical History: Negative for: Lupus Erythematosus; Raynaudos; Scleroderma Integumentary (Skin) Complaints and Symptoms: Positive for: Wounds - 2 Negative for: Bleeding or bruising tendency; Breakdown; Swelling Medical History: Negative for: History of Burn; History of  pressure wounds Musculoskeletal Complaints and Symptoms: Negative for: Muscle Pain; Muscle Weakness Medical History: Positive for: Osteoarthritis - knee bilateral; Osteomyelitis Tamargo, Janeane E. ([id number]) Neurologic Complaints and Symptoms: Negative for: Numbness/parasthesias; Focal/Weakness Medical History: Positive for: Neuropathy - peripheral Negative for: Dementia; Quadriplegia; Paraplegia; Seizure Disorder HBO Extended History Items Eyes: Cataracts Immunizations Pneumococcal Vaccine: Received Pneumococcal Vaccination: No Implantable Devices None Family and Social History Cancer: No; Diabetes: Yes - Mother,Father; Heart Disease: Yes - Mother,Father; Hereditary Spherocytosis: No; Hypertension: Yes - Mother; Kidney Disease: No; Lung Disease: No; Seizures: No; Stroke: No; Thyroid Problems: No; Tuberculosis: No; Current every day smoker - 49 years; Alcohol Use: Never; Drug Use: No History; Caffeine Use: Daily; Financial Concerns: No; Food, Clothing or Shelter Needs: No; Support System Lacking: No; Transportation Concerns: No Electronic Signature(s) Signed: 02/26/2019 4:41:01 PM By: Harold Barban Signed: 02/26/2019 5:54:02 PM By: Linton Ham MD Entered By: Harold Barban on 02/26/2019 10:01:01 Melinda [name] ([id number]) -------------------------------------------------------------------------------- [facility] Details Patient Name: Melinda [name] Date of Service: 02/26/2019 Medical Record Number:  [id number] Patient Account Number: [id number] Date of Birth/Sex: July 20, 1955 (62 y.o. 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 E11.51 Type 2 diabetes mellitus with diabetic peripheral angiopathy without gangrene E11.42 Type 2 diabetes mellitus with diabetic polyneuropathy Facility Procedures CPT4 Code: [id number] Description: 99213 - WOUND CARE VISIT-LEV 3 EST PT Modifier: Quantity: 1 CPT4 Code: [id number] Description: [place] - DEB SUBQ TISSUE 20 SQ CM/< ICD-10 Diagnosis Description L97.512 Non-pressure chronic ulcer of other part of right foot with fat L97.522 Non-pressure chronic ulcer of other part of left foot with fat Modifier: layer exposed layer exposed Quantity: 1 Physician Procedures CPT4 Code Description: [id number] - WC PHYS LEVEL 4 - NEW PT ICD-10 Diagnosis Description E11.621 Type 2 diabetes mellitus with foot ulcer E11.51 Type 2 diabetes mellitus with diabetic peripheral angiopathy wit L97.512 Non-pressure chronic ulcer of  other part of right foot with fat L97.522 Non-pressure chronic ulcer of other part of left foot with fat l Modifier: 25 hout gangrene layer exposed ayer exposed Quantity: 1 CPT4 Code Description: [id number] - WC PHYS SUBQ TISS 20 SQ CM ICD-10 Diagnosis Description L97.512 Non-pressure chronic ulcer of other part of right foot with fat L97.522 Non-pressure chronic ulcer of other part of left foot with fat l Modifier: layer exposed ayer exposed Quantity: 1 Electronic Signature(s) Signed: 02/26/2019 5:54:02 PM By: Linton Ham MD Entered By: Linton Ham on 02/26/2019 12:18:27

## 2019-02-27 NOTE — Progress Notes (Signed)
[name], [name] ([id number]) Visit Report for 02/26/2019 Allergy List Details Patient Name: Melinda [name], Melinda [name] Date of Service: 02/26/2019 9:45 AM Medical Record Number: [id number] Patient Account Number: [id number] Date of Birth/Sex: Sep 22, 1955 (63 y.o. F) Treating RN: Harold Barban Primary Care Meiling Hendriks: [name] Other Clinician: Referring Colton Engdahl: [name] Treating Keturah Yerby/Extender: Ricard Dillon Weeks in Treatment: 0 Allergies Active Allergies No Known Drug Allergies Allergy Notes Electronic Signature(s) Signed: 02/26/2019 4:41:01 PM By: Harold Barban Entered By: Harold Barban on 02/26/2019 [place] Melinda [name] ([id number]) -------------------------------------------------------------------------------- Arrival Information Details Patient Name: Melinda [name] Date of Service: 02/26/2019 9:45 AM Medical Record Number: [id number] Patient Account Number: [id number] Date of Birth/Sex: 26-Jun-1955 (63 y.o. F) Treating RN: Harold Barban Primary Care Truxton Stupka: [name] Other Clinician: Referring Shloka Baldridge: [name] Treating Ranetta Armacost/Extender: Tito Dine in Treatment: 0 Visit Information Patient Arrived: Ambulatory Arrival Time: 09:41 Accompanied By: self Transfer Assistance: None Patient Identification Verified: Yes Secondary Verification Process Yes Completed: Patient Has Alerts: Yes Patient Alerts: Patient on Blood Thinner Plavix, Aspirin 81mg  ABI's L .88/R .71 Electronic Signature(s) Signed: 02/26/2019 10:12:50 AM By: Harold Barban Previous Signature: 02/26/2019 10:12:03 AM Version By: Harold Barban Entered By: Harold Barban on 02/26/2019 10:12:50 Melinda [name] ([id number]) -------------------------------------------------------------------------------- Clinic Level of Care Assessment Details Patient Name: Melinda [name] Date of Service: 02/26/2019 9:45 AM Medical Record Number: [id number] Patient Account Number:  [id number] Date of Birth/Sex: [birth year] (63 y.o. F) Treating RN: [name] Primary Care Tayleigh Wetherell: [name] Other Clinician: Referring Yidel Teuscher: [name] Treating Nicky Milhouse/Extender: Tito Dine in Treatment: 0 Clinic Level of Care Assessment Items TOOL 1 Quantity Score []  - Use when EandM and Procedure is performed on INITIAL visit 0 ASSESSMENTS - Nursing Assessment / Reassessment X - General Physical Exam (combine w/ comprehensive assessment (listed just below) when 1 20 performed on new pt. evals) X- 1 25 Comprehensive Assessment (HX, ROS, Risk Assessments, Wounds Hx, etc.) ASSESSMENTS - Wound and Skin Assessment / Reassessment []  - Dermatologic / Skin Assessment (not related to wound area) 0 ASSESSMENTS - Ostomy and/or Continence Assessment and Care []  - Incontinence Assessment and Management 0 []  - 0 Ostomy Care Assessment and Management (repouching, etc.) PROCESS - Coordination of Care []  - Simple Patient / Family Education for ongoing care 0 X- 1 20 Complex (extensive) Patient / Family Education for ongoing care []  - 0 Staff obtains Programmer, systems, Records, Test Results / Process Orders []  - 0 Staff telephones HHA, Nursing Homes / Clarify orders / etc []  - 0 Routine Transfer to another Facility (non-emergent condition) []  - 0 Routine Hospital Admission (non-emergent condition) X- 1 15 New Admissions / Biomedical engineer / Ordering NPWT, Apligraf, etc. []  - 0 Emergency Hospital Admission (emergent condition) PROCESS - Special Needs []  - Pediatric / Minor Patient Management 0 []  - 0 Isolation Patient Management []  - 0 Hearing / Language / Visual special needs []  - 0 Assessment of Community assistance (transportation, D/C planning, etc.) []  - 0 Additional assistance / Altered mentation []  - 0 Support Surface(s) Assessment (bed, cushion, seat, etc.) Melinda [name], Melinda E. ([id number]) INTERVENTIONS - Miscellaneous []  - External ear exam 0 []  -  0 Patient Transfer (multiple staff / Civil Service fast streamer / Similar devices) []  - 0 Simple Staple / Suture removal (25 or less) []  - 0 Complex Staple / Suture removal (26 or more) []  - 0 Hypo/Hyperglycemic Management (do not check if billed separately) X- 1 15 Ankle / Brachial Index (ABI) - do not check if billed  separately Has the patient been seen at the hospital within the last three years: Yes Total Score: 95 Level Of Care: New/Established - Level 3 Electronic Signature(s) Signed: 02/27/2019 10:04:30 AM By: [name], [name], RN, [name], [name] RN, [name] Entered By: [name], [name], RN, [name], [name] on 02/26/2019 11:04:50 Melinda [name] ([id number]) -------------------------------------------------------------------------------- Encounter Discharge Information Details Patient Name: Melinda [name] Date of Service: 02/26/2019 9:45 AM Medical Record Number: [id number] Patient Account Number: [id number] Date of Birth/Sex: March 31, 1956 (63 y.o. F) Treating RN: [name] Primary Care Sharon Stapel: [name] Other Clinician: Referring Aara Jacquot: [name] Treating Ramie Palladino/Extender: Tito Dine in Treatment: 0 Encounter Discharge Information Items Post Procedure Vitals Discharge Condition: Stable Temperature (F): 98.9 Ambulatory Status: Ambulatory Pulse (bpm): 88 Discharge Destination: Home Respiratory Rate (breaths/min): 18 Transportation: Private Auto Blood Pressure (mmHg): 141/88 Accompanied By: self Schedule Follow-up Appointment: Yes Clinical Summary of Care: Electronic Signature(s) Signed: 02/26/2019 4:47:48 PM By: [name] Entered By: [name] on 02/26/2019 11:15:38 Melinda [name] ([id number]) -------------------------------------------------------------------------------- Lower Extremity Assessment Details Patient Name: Melinda [name] Date of Service: 02/26/2019 9:45 AM Medical Record Number: [id number] Patient Account Number: [id number] Date of Birth/Sex:  [birth year] (63 y.o. F) Treating RN: Harold Barban Primary Care Erico Stan: [name] Other Clinician: Referring Daevion Navarette: [name] Treating Caidyn Henricksen/Extender: Tito Dine in Treatment: 0 Edema Assessment Assessed: [Left: No] [Right: No] [Left: Edema] [Right: :] Calf Left: Right: Point of Measurement: 38 cm From Medial Instep 43 cm 42 cm Ankle Left: Right: Point of Measurement: 10 cm From Medial Instep 24.5 cm 24 cm Vascular Assessment Pulses: Dorsalis Pedis Palpable: [Left:Yes] [Right:Yes] Posterior Tibial Palpable: [Left:Yes] [Right:Yes] Electronic Signature(s) Signed: 02/26/2019 4:41:01 PM By: Harold Barban Entered By: Harold Barban on 02/26/2019 10:04:35 Melinda [name] ([id number]) -------------------------------------------------------------------------------- Multi Wound Chart Details Patient Name: Melinda [name] Date of Service: 02/26/2019 9:45 AM Medical Record Number: [id number] Patient Account Number: [id number] Date of Birth/Sex: August 21, 1955 (63 y.o. F) Treating RN: [name] Primary Care Xzavion Doswell: [name] Other Clinician: Referring Tawania Daponte: [name] Treating Reham Slabaugh/Extender: Tito Dine in Treatment: 0 Vital Signs Height(in): 68 Pulse(bpm): 88 Weight(lbs): 300 Blood Pressure(mmHg): 141/58 Body Mass Index(BMI): 46 Temperature(F): 98.8 Respiratory Rate 18 (breaths/min): Photos: [N/A:N/A] Wound Location: Left Toe Great - Plantar Right Toe Great - Plantar N/A Wounding Event: Gradually Appeared Gradually Appeared N/A Primary Etiology: Diabetic Wound/Ulcer of the Diabetic Wound/Ulcer of the N/A Lower Extremity Lower Extremity Comorbid History: Cataracts, Coronary Artery Cataracts, Coronary Artery N/A Disease, Hypertension, Disease, Hypertension, Myocardial Infarction, Myocardial Infarction, Peripheral Venous Disease, Peripheral Venous Disease, Type II Diabetes, Type II Diabetes, Osteoarthritis,  Osteomyelitis, Osteoarthritis, Osteomyelitis, Neuropathy Neuropathy Date Acquired: 11/30/2018 03/31/2018 N/A Weeks of Treatment: 0 0 N/A Wound Status: Open Open N/A Measurements L x W x D 2.1x1.9x0.5 0.4x0.3x0.7 N/A (cm) Area (cm) : 3.134 0.094 N/A Volume (cm) : 1.567 0.066 N/A Classification: Grade 2 Grade 2 N/A Exudate Amount: Medium Medium N/A Exudate Type: Serosanguineous Serosanguineous N/A Exudate Color: red, brown red, brown N/A Wound Margin: Epibole Epibole N/A Granulation Amount: Small (1-33%) Small (1-33%) N/A Granulation Quality: Pale Pale N/A Necrotic Amount: Large (67-100%) Large (67-100%) N/A Exposed Structures: Fat Layer (Subcutaneous Fat Layer (Subcutaneous N/A Tissue) Exposed: Yes Tissue) Exposed: Yes Fascia: No Fascia: No Crisostomo, Margarite E. ([id number]) Tendon: No Tendon: No Muscle: No Muscle: No Joint: No Joint: No Bone: No Bone: No Epithelialization: None None N/A Treatment Notes Electronic Signature(s) Signed: 02/27/2019 10:04:30 AM By: [name], [name], RN, [name], [name] RN, [name] Entered By: [name], [name], RN,  [name], [name] on 02/26/2019 10:52:02 Melinda [name], Melinda [name] ([id number]) -------------------------------------------------------------------------------- [place] Details Patient Name: Melinda [name], Melinda [name] Date of Service: 02/26/2019 9:45 AM Medical Record Number: [id number] Patient Account Number: [id number] Date of Birth/Sex: Jun 03, 1955 (63 y.o. F) Treating RN: [name] Primary Care Bronc Brosseau: [name] Other Clinician: Referring Neyland Pettengill: [name] Treating Beverley Sherrard/Extender: Tito Dine in Treatment: 0 Active Inactive Medication Nursing Diagnoses: Knowledge deficit related to medication safety: actual or potential Goals: Patient/caregiver will demonstrate understanding of all current medications Date Initiated: 02/26/2019 Target Resolution Date: 02/26/2019 Goal Status: Active Interventions: Assess for medication  contraindications each visit where new medications are prescribed Notes: Necrotic Tissue Nursing Diagnoses: Impaired tissue integrity related to necrotic/devitalized tissue Goals: Necrotic/devitalized tissue will be minimized in the wound bed Date Initiated: 02/26/2019 Target Resolution Date: 03/06/2019 Goal Status: Active Interventions: Assess patient pain level pre-, during and post procedure and prior to discharge Treatment Activities: Apply topical anesthetic as ordered : 02/26/2019 Notes: Orientation to the Wound Care Program Nursing Diagnoses: Knowledge deficit related to the wound healing center program Goals: Patient/caregiver will verbalize understanding of the [facility] Program Date Initiated: 02/26/2019 Target Resolution Date: 02/26/2019 Goal Status: Active ESMIE, ROMRELL ([id number]) Interventions: Provide education on orientation to the wound center Notes: Pressure Nursing Diagnoses: Knowledge deficit related to causes and risk factors for pressure ulcer development Knowledge deficit related to management of pressures ulcers Goals: Patient will remain free from development of additional pressure ulcers Date Initiated: 02/26/2019 Target Resolution Date: 02/26/2019 Goal Status: Active Patient will remain free of pressure ulcers Date Initiated: 02/26/2019 Target Resolution Date: 02/26/2019 Goal Status: Active Patient/caregiver will verbalize understanding of pressure ulcer management Date Initiated: 02/26/2019 Target Resolution Date: 02/26/2019 Goal Status: Active Interventions: Assess: immobility, friction, shearing, incontinence upon admission and as needed Notes: Wound/Skin Impairment Nursing Diagnoses: Impaired tissue integrity Goals: Ulcer/skin breakdown will have a volume reduction of 30% by week 4 Date Initiated: 02/26/2019 Target Resolution Date: 03/29/2019 Goal Status: Active Interventions: Provide education on ulcer and skin  care Treatment Activities: Topical wound management initiated : 02/26/2019 Notes: Electronic Signature(s) Signed: 02/27/2019 10:04:30 AM By: [name], [name], RN, [name], [name] RN, [name] Entered By: [name], [name], RN, [name], [name] on 02/26/2019 10:51:41 Melinda [name] ([id number]) -------------------------------------------------------------------------------- Pain Assessment Details Patient Name: Melinda [name] Date of Service: 02/26/2019 9:45 AM Medical Record Number: [id number] Patient Account Number: [id number] Date of Birth/Sex: 1955-05-19 (63 y.o. F) Treating RN: Harold Barban Primary Care Donnivan Villena: [name] Other Clinician: Referring Antjuan Rothe: [name] Treating Taci Sterling/Extender: Tito Dine in Treatment: 0 Active Problems Location of Pain Severity and Description of Pain Patient Has Paino No Site Locations Pain Management and Medication Current Pain Management: Electronic Signature(s) Signed: 02/26/2019 4:41:01 PM By: Harold Barban Entered By: Harold Barban on 02/26/2019 09:44:17 Melinda [name] ([id number]) -------------------------------------------------------------------------------- Patient/Caregiver Education Details Patient Name: Melinda [name] Date of Service: 02/26/2019 9:45 AM Medical Record Number: [id number] Patient Account Number: [id number] Date of Birth/Gender: [birth year] (64 y.o. F) Treating RN: [name] Primary Care Physician: [name] Other Clinician: Referring Physician: [name] Treating Physician/Extender: Tito Dine in Treatment: 0 Education Assessment Education Provided To: Patient Education Topics Provided Welcome To [facility]: Handouts: Welcome To [facility] Methods: Demonstration Responses: State content correctly Wound/Skin Impairment: Handouts: Caring for Your Ulcer Methods: Demonstration, Explain/Verbal Responses: State content correctly Electronic Signature(s) Signed:  02/27/2019 10:04:30 AM By: [name], [name], RN, [name], [name] RN, [name] Entered By: [name], [name], RN, [name], [name]  on 02/26/2019 11:05:20 Melinda [name], Melinda [name] ([id number]) -------------------------------------------------------------------------------- Wound Assessment Details Patient Name: Melinda [name], Melinda [name] Date of Service: 02/26/2019 9:45 AM Medical Record Number: [id number] Patient Account Number: [id number] Date of Birth/Sex: 27-Sep-1955 (63 y.o. F) Treating RN: Harold Barban Primary Care Deveney Bayon: [name] Other Clinician: Referring Hollyn Stucky: [name] Treating Marckus Hanover/Extender: Tito Dine in Treatment: 0 Wound Status Wound Number: 1 Primary Diabetic Wound/Ulcer of the Lower Extremity Etiology: Wound Location: Left Toe Great - Plantar Wound Open Wounding Event: Gradually Appeared Status: Date Acquired: 11/30/2018 Comorbid Cataracts, Coronary Artery Disease, Weeks Of Treatment: 0 History: Hypertension, Myocardial Infarction, Peripheral Clustered Wound: No Venous Disease, Type II Diabetes, Osteoarthritis, Osteomyelitis, Neuropathy Photos Wound Measurements Length: (cm) 2.1 % Reductio Width: (cm) 1.9 % Reductio Depth: (cm) 0.5 Epithelial Area: (cm) 3.134 Tunneling Volume: (cm) 1.567 Undermini n in Area: n in Volume: ization: None : No ng: No Wound Description Classification: Grade 2 Foul Odor Wound Margin: Epibole Slough/Fib Exudate Amount: Medium Exudate Type: Serosanguineous Exudate Color: red, brown After Cleansing: No rino Yes Wound Bed Granulation Amount: Small (1-33%) Exposed Structure Granulation Quality: Pale Fascia Exposed: No Necrotic Amount: Large (67-100%) Fat Layer (Subcutaneous Tissue) Exposed: Yes Necrotic Quality: Adherent Slough Tendon Exposed: No Muscle Exposed: No Joint Exposed: No Bone Exposed: No [place], [place]. ([id number]) Treatment Notes Wound #1 (Left, Plantar Toe Great) Notes silvercel, gauze, conform with darco with peg  assist Electronic Signature(s) Signed: 02/26/2019 4:41:01 PM By: Harold Barban Entered By: Harold Barban on 02/26/2019 10:07:07 Melinda [name] ([id number]) -------------------------------------------------------------------------------- Wound Assessment Details Patient Name: Melinda [name] Date of Service: 02/26/2019 9:45 AM Medical Record Number: [id number] Patient Account Number: [id number] Date of Birth/Sex: 04-Mar-1956 (63 y.o. F) Treating RN: Harold Barban Primary Care Graig Hessling: [name] Other Clinician: Referring Barre Aydelott: [name] Treating Oracio Galen/Extender: Tito Dine in Treatment: 0 Wound Status Wound Number: 2 Primary Diabetic Wound/Ulcer of the Lower Extremity Etiology: Wound Location: Right Toe Great - Plantar Wound Open Wounding Event: Gradually Appeared Status: Date Acquired: 03/31/2018 Comorbid Cataracts, Coronary Artery Disease, Weeks Of Treatment: 0 History: Hypertension, Myocardial Infarction, Peripheral Clustered Wound: No Venous Disease, Type II Diabetes, Osteoarthritis, Osteomyelitis, Neuropathy Photos Wound Measurements Length: (cm) 0.4 % Reduction Width: (cm) 0.3 % Reduction Depth: (cm) 0.7 Epitheliali Area: (cm) 0.094 Tunneling: Volume: (cm) 0.066 Underminin in Area: in Volume: zation: None No g: No Wound Description Classification: Grade 2 Foul Odor A Wound Margin: Epibole Slough/Fibr Exudate Amount: Medium Exudate Type: Serosanguineous Exudate Color: red, brown fter Cleansing: No ino Yes Wound Bed Granulation Amount: Small (1-33%) Exposed Structure Granulation Quality: Pale Fascia Exposed: No Necrotic Amount: Large (67-100%) Fat Layer (Subcutaneous Tissue) Exposed: Yes Necrotic Quality: Adherent Slough Tendon Exposed: No Muscle Exposed: No Joint Exposed: No Bone Exposed: No [place], [place]. ([id number]) Treatment Notes Wound #2 (Right, Plantar Toe Great) Notes silvercel, gauze, conform with darco  with peg assist Electronic Signature(s) Signed: 02/26/2019 4:41:01 PM By: Harold Barban Entered By: Harold Barban on 02/26/2019 10:09:01 Melinda [name] ([id number]) -------------------------------------------------------------------------------- Vitals Details Patient Name: Melinda [name] Date of Service: 02/26/2019 9:45 AM Medical Record Number: [id number] Patient Account Number: [id number] Date of Birth/Sex: 10-Mar-1956 (63 y.o. F) Treating RN: Harold Barban Primary Care Ashrith Sagan: [name] Other Clinician: Referring Deegan Valentino: [name] Treating Izayiah Tibbitts/Extender: Tito Dine in Treatment: 0 Vital Signs Time Taken: 09:44 Temperature (F): 98.8 Height (in): 68 Pulse (bpm): 88 Source: Stated Respiratory Rate (breaths/min): 18 Weight (lbs): 300 Blood Pressure (mmHg): 141/58 Source: Stated Reference Range: 80 - 120  mg / dl Body Mass Index (BMI): 45.6 Electronic Signature(s) Signed: 02/26/2019 4:41:01 PM By: Harold Barban Entered By: Harold Barban on 02/26/2019 09:46:01

## 2019-03-05 ENCOUNTER — Other Ambulatory Visit: Payer: Self-pay

## 2019-03-05 ENCOUNTER — Encounter: Payer: Medicare HMO | Attending: Internal Medicine | Admitting: Internal Medicine

## 2019-03-05 DIAGNOSIS — I10 Essential (primary) hypertension: Secondary | ICD-10-CM | POA: Diagnosis not present

## 2019-03-05 DIAGNOSIS — I2510 Atherosclerotic heart disease of native coronary artery without angina pectoris: Secondary | ICD-10-CM | POA: Diagnosis not present

## 2019-03-05 DIAGNOSIS — Z951 Presence of aortocoronary bypass graft: Secondary | ICD-10-CM | POA: Diagnosis not present

## 2019-03-05 DIAGNOSIS — E1142 Type 2 diabetes mellitus with diabetic polyneuropathy: Secondary | ICD-10-CM | POA: Diagnosis not present

## 2019-03-05 DIAGNOSIS — E669 Obesity, unspecified: Secondary | ICD-10-CM | POA: Insufficient documentation

## 2019-03-05 DIAGNOSIS — L97512 Non-pressure chronic ulcer of other part of right foot with fat layer exposed: Secondary | ICD-10-CM | POA: Diagnosis not present

## 2019-03-05 DIAGNOSIS — E11621 Type 2 diabetes mellitus with foot ulcer: Secondary | ICD-10-CM | POA: Diagnosis not present

## 2019-03-05 DIAGNOSIS — Z6841 Body Mass Index (BMI) 40.0 and over, adult: Secondary | ICD-10-CM | POA: Diagnosis not present

## 2019-03-05 DIAGNOSIS — E1151 Type 2 diabetes mellitus with diabetic peripheral angiopathy without gangrene: Secondary | ICD-10-CM | POA: Insufficient documentation

## 2019-03-05 DIAGNOSIS — L97522 Non-pressure chronic ulcer of other part of left foot with fat layer exposed: Secondary | ICD-10-CM | POA: Diagnosis not present

## 2019-03-07 NOTE — Progress Notes (Addendum)
Melinda, [name] ([id number]) Visit Report for 03/05/2019 Debridement Details Patient Name: Melinda, [name] Date of Service: 03/05/2019 3:30 PM Medical Record Number: [id number] Patient Account Number: [id number] Date of Birth/Sex: February 10, 1956 (63 y.o. F) Treating RN: [name] Primary Care Provider: [name] Other Clinician: Referring Provider: [name] Treating Provider/Extender: Tito Dine in Treatment: 1 Debridement Performed for Wound #1 Left,Plantar Toe Great Assessment: Performed By: Physician Ricard Dillon, MD Debridement Type: Debridement Severity of Tissue Pre Fat layer exposed Debridement: Level of Consciousness (Pre- Awake and Alert procedure): Pre-procedure Verification/Time Yes - 12:11 Out Taken: Start Time: 12:11 Pain Control: Lidocaine Total Area Debrided (L x W): 2.1 (cm) x 2.2 (cm) = 4.62 (cm) Tissue and other material Viable, Subcutaneous debrided: Level: Skin/Subcutaneous Tissue Debridement Description: Excisional Instrument: Curette Bleeding: Moderate Hemostasis Achieved: Pressure End Time: 12:13 Response to Treatment: Procedure was tolerated well Level of Consciousness Awake and Alert (Post-procedure): Post Debridement Measurements of Total Wound Length: (cm) 2.1 Width: (cm) 2.2 Depth: (cm) 0.4 Volume: (cm) 1.451 Character of Wound/Ulcer Post Debridement: Stable Severity of Tissue Post Debridement: Fat layer exposed Post Procedure Diagnosis Same as Pre-procedure Electronic Signature(s) Signed: 03/12/2019 11:53:08 AM By: [name], [name], RN, [name], [name] RN, [name] Signed: 03/12/2019 5:41:49 PM By: Linton Ham MD Previous Signature: 03/06/2019 5:19:38 PM Version By: [name] [name], RN, [name], [name] RN, [name] Previous Signature: 03/07/2019 7:47:21 AM Version By: Linton Ham MD Entered By: [name], [name], RN, [name], [name] on 03/12/2019 11:53:08 Melinda, [name] ([id number] [name], Melinda [name]  ([id number]) -------------------------------------------------------------------------------- Debridement Details Patient Name: Melinda, [name] Date of Service: 03/05/2019 3:30 PM Medical Record Number: [id number] Patient Account Number: [id number] Date of Birth/Sex: 22-Feb-1956 (63 y.o. F) Treating RN: [name] Primary Care Provider: [name] Other Clinician: Referring Provider: [name] Treating Provider/Extender: Tito Dine in Treatment: 1 Debridement Performed for Wound #2 Right,Plantar Toe Great Assessment: Performed By: Physician Ricard Dillon, MD Debridement Type: Debridement Severity of Tissue Pre Fat layer exposed Debridement: Level of Consciousness (Pre- Awake and Alert procedure): Pre-procedure Verification/Time Yes - 12:11 Out Taken: Start Time: 12:11 Pain Control: Lidocaine Total Area Debrided (L x W): 0.7 (cm) x 0.7 (cm) = 0.49 (cm) Tissue and other material Viable, Slough, Subcutaneous, Slough debrided: Level: Skin/Subcutaneous Tissue Debridement Description: Excisional Instrument: Curette Bleeding: Moderate Hemostasis Achieved: Pressure End Time: 12:13 Response to Treatment: Procedure was tolerated well Level of Consciousness Awake and Alert (Post-procedure): Post Debridement Measurements of Total Wound Length: (cm) 0.7 Width: (cm) 0.7 Depth: (cm) 0.6 Volume: (cm) 0.231 Character of Wound/Ulcer Post Debridement: Stable Severity of Tissue Post Debridement: Fat layer exposed Post Procedure Diagnosis Same as Pre-procedure Electronic Signature(s) Signed: 03/12/2019 11:53:20 AM By: [name], [name], RN, [name], [name] RN, [name] Signed: 03/12/2019 5:41:49 PM By: Linton Ham MD Previous Signature: 03/06/2019 5:19:38 PM Version By: [name], [name], RN, [name], [name] RN, [name] Previous Signature: 03/07/2019 7:47:21 AM Version By: Linton Ham MD Entered By: [name], [name], RN, [name], [name] on 03/12/2019 11:53:20 Melinda [name]  ([id number]) -------------------------------------------------------------------------------- HPI Details Patient Name: Melinda, [name] Date of Service: 03/05/2019 3:30 PM Medical Record Number: [id number] Patient Account Number: [id number] Date of Birth/Sex: 1955/08/25 (63 y.o. F) Treating RN: [name] Primary Care Provider: [name] Other Clinician: Referring Provider: [name] Treating Provider/Extender: Tito Dine in Treatment: 1 History of Present Illness HPI Description: ADMISSION 02/26/2019 Patient is a 63 year old type II diabetic on insulin with significant polyneuropathy. She has been followed by Dr. Sherren Mocha cline of podiatry for problems  related to her feet dating back to the early part of 2019 as I can review in [facility] link. 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 Subsequently she was seen by Dr. Lucky Cowboy of vascular surgery and had stents x2 placed in her left SFA as well as left SFA angioplasties on 05/20/2017. She was noted to have a wound on her left foot in October 2019. In August 2020 on 8/24 she underwent a right anterior tibial artery angioplasty a right tibioperoneal trunk angioplasty and a right SFA angioplasty. The patient states that she developed a left great toe wound in August which is at the base of her previous partial amputation in this area. She tells [name] that she has had a right great toe wound since December 2019 and she has been using Santyl to both of these areas that she received from a fellow parishioner at her church. By enlarge she has been using Neosporin to these areas and not offloading them specifically Arterial studies on 9/22 showed an ABI on the right of 0.71 with triphasic waveforms on the left at 0.88 with triphasic and biphasic waveforms. TBI's on the right and 0.44 and on the left at 1.05. Past medical history includes hypertension, type 2 diabetes with peripheral neuropathy,  known PAD, coronary artery disease status post CABG x4 in 2016 obesity, tobacco abuse, bilateral third toe amputations. 03/05/19; x-rays I did last week were both negative for osteomyelitis. She has a fairly large wound at the base of her left first toe and a small punched out area on the right first toe. We use silver alginate last week Electronic Signature(s) Signed: 03/17/2019 9:54:58 AM By: [name], [name], RN, [name], [name] RN, [name] Signed: 03/23/2019 8:18:45 AM By: Linton Ham MD Previous Signature: 03/07/2019 7:47:21 AM Version By: Linton Ham MD Entered By: [name], [name], RN, [name], [name] on 03/17/2019 09:54:58 Melinda [name] ([id number]) -------------------------------------------------------------------------------- Physical Exam Details Patient Name: Melinda, [name] Date of Service: 03/05/2019 3:30 PM Medical Record Number: [id number] Patient Account Number: [id number] Date of Birth/Sex: Mar 19, 1956 (63 y.o. F) Treating RN: [name] Primary Care Provider: [name] Other Clinician: Referring Provider: [name] Treating Provider/Extender: Tito Dine in Treatment: 1 Constitutional Sitting or standing Blood Pressure is within target range for patient.. Pulse regular and within target range for patient.Marland Kitchen Respirations regular, non-labored and within target range.. Temperature is normal and within the target range for the patient.Marland Kitchen appears in no distress. Notes Wound exam; the patient has a large wound at the remanent of her left first toe. Debrided with a #5 curette callus around the wound edge and debris over the wound surface. Hemostasis with silver nitrate. Although the wound surface here appears vibrant it certainly is extremely fibrinous to the curette oShe also has a smaller deeper wound on the right. Debrided with a #5 curette. Hemostasis with direct pressure. Electronic Signature(s) Signed: 03/07/2019 7:47:21 AM By: Linton Ham MD Entered By: Linton Ham  on 03/05/2019 12:31:46 Melinda [name] ([id number]) -------------------------------------------------------------------------------- Physician Orders Details Patient Name: Melinda [name] Date of Service: 03/05/2019 3:30 PM Medical Record Number: [id number] Patient Account Number: [id number] Date of Birth/Sex: [birth year] (63 y.o. F) Treating RN: [name] Primary Care Provider: [name] Other Clinician: Referring Provider: [name] Treating Provider/Extender: Tito Dine in Treatment: 1 Verbal / Phone Orders: No Diagnosis Coding Wound Cleansing Wound #1 Left,Plantar Toe Great o Cleanse wound with mild soap and water o No tub bath.  Wound #2 Right,Plantar Toe Great o Cleanse wound with mild soap and water o No tub bath. Anesthetic (add to Medication List) Wound #1 Left,Plantar Toe Great o Topical Lidocaine 4% cream applied to wound bed prior to debridement (In Clinic Only). Wound #2 Right,Plantar Toe Great o Topical Lidocaine 4% cream applied to wound bed prior to debridement (In Clinic Only). Primary Wound Dressing Wound #1 Left,Plantar Toe Great o Silver Alginate Wound #2 Right,Plantar Toe Great o Silver Alginate Secondary Dressing Wound #1 Left,Plantar Toe Great o Gauze and Kerlix/Conform Wound #2 Right,Plantar Toe Great o Gauze and Kerlix/Conform Dressing Change Frequency Wound #1 Left,Plantar Toe Great o Change dressing every other day. Wound #2 Right,Plantar Toe Great o Change dressing every other day. Follow-up Appointments Wound #1 Left,Plantar Toe Great o Return Appointment in 1 week. Wound #2 Right,Plantar Toe Great o Return Appointment in 1 week. Melinda, [name] ([id number]) Edema Control Wound #1 Left,Plantar Toe Great o Elevate legs to the level of the heart and pump ankles as often as possible Wound #2 Right,Plantar Toe Great o Elevate legs to the level of the heart and pump ankles as often as  possible Off-Loading Wound #1 Left,Plantar Toe Great o Open toe surgical shoe with peg assist. - Bilateral Wound #2 Right,Plantar Toe Great o Open toe surgical shoe with peg assist. - Bilateral Additional Orders / Instructions Wound #1 Left,Plantar Toe Great o Stop Smoking o Increase protein intake. Wound #2 Right,Plantar Toe Great o Stop Smoking o Increase protein intake. Electronic Signature(s) Signed: 03/06/2019 5:19:38 PM By: [name], [name], RN, [name], [name] RN, [name] Signed: 03/07/2019 7:47:21 AM By: Linton Ham MD Entered By: [name], [name], RN, [name], [name] on 03/05/2019 12:16:35 Melinda [name] ([id number]) -------------------------------------------------------------------------------- Problem List Details Patient Name: Melinda, [name] Date of Service: 03/05/2019 3:30 PM Medical Record Number: [id number] Patient Account Number: [id number] Date of Birth/Sex: 06/16/1955 (63 y.o. F) Treating RN: [name] Primary Care Provider: [name] Other Clinician: Referring Provider: [name] Treating Provider/Extender: Tito Dine in Treatment: 1 Active Problems ICD-10 Evaluated Encounter Code Description Active Date Today Diagnosis E11.621 Type 2 diabetes mellitus with foot ulcer 02/26/2019 No Yes L97.512 Non-pressure chronic ulcer of other part of right foot with fat 02/26/2019 No Yes layer exposed L97.522 Non-pressure chronic ulcer of other part of left foot with fat 02/26/2019 No Yes layer exposed E11.51 Type 2 diabetes mellitus with diabetic peripheral angiopathy 02/26/2019 No Yes without gangrene E11.42 Type 2 diabetes mellitus with diabetic polyneuropathy 02/26/2019 No Yes Inactive Problems Resolved Problems Electronic Signature(s) Signed: 03/07/2019 7:47:21 AM By: Linton Ham MD Entered By: Linton Ham on 03/05/2019 12:24:38 Melinda [name] ([id number]) -------------------------------------------------------------------------------- Progress  Note Details Patient Name: Melinda [name] Date of Service: 03/05/2019 3:30 PM Medical Record Number: [id number] Patient Account Number: [id number] Date of Birth/Sex: [birth year] (63 y.o. F) Treating RN: [name] Primary Care Provider: [name] Other Clinician: Referring Provider: [name] Treating Provider/Extender: Tito Dine in Treatment: 1 Subjective History of Present Illness (HPI) ADMISSION 02/26/2019 Patient is a 63 year old type II diabetic on insulin with significant polyneuropathy. She has been followed by Dr. Sherren Mocha cline of podiatry for problems related to her feet dating back to the early part of 2019 as I can review in [facility] link. 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 Subsequently she was seen by Dr. Lucky Cowboy of vascular surgery and had stents x2 placed in her left SFA as well as left SFA  angioplasties on 05/20/2017. She was noted to have a wound on her left foot in October 2019. In August 2020 on 8/24 she underwent a right anterior tibial artery angioplasty a right tibioperoneal trunk angioplasty and a right SFA angioplasty. The patient states that she developed a left great toe wound in August which is at the base of her previous partial amputation in this area. She tells [name] that she has had a right great toe wound since December 2019 and she has been using Santyl to both of these areas that she received from a fellow parishioner at her church. By enlarge she has been using Neosporin to these areas and not offloading them specifically Arterial studies on 9/22 showed an ABI on the right of 0.71 with triphasic waveforms on the left at 0.88 with triphasic and biphasic waveforms. TBI's on the right and 0.44 and on the left at 1.05. 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 03/05/19; x-rays I did last week were both negative for osteomyelitis. She has a fairly large wound at the base of her left first toe and a small punched out area on the right first toe. We use silver alginate last week Objective Constitutional Sitting or standing Blood Pressure is within target range for patient.. Pulse regular and within target range for patient.Marland Kitchen Respirations regular, non-labored and within target range.. Temperature is normal and within the target range for the patient.Marland Kitchen appears in no distress. Vitals Time Taken: 11:37 AM, Height: 68 in, Weight: 300 lbs, BMI: 45.6, Temperature: 98.7 F, Pulse: 87 bpm, Respiratory Rate: 16 breaths/min, Blood Pressure: 137/55 mmHg. General Notes: Wound exam; the patient has a large wound at the remanent of her left first toe. Debrided with a #5 curette callus around the wound edge and debris over the wound surface. Hemostasis with silver nitrate. Although the wound surface Palladino, Amsi E. ([id number]) here appears vibrant it certainly is extremely fibrinous to the curette She also has a smaller deeper wound on the right. Debrided with a #5 curette. Hemostasis with direct pressure. Integumentary (Hair, Skin) Wound #1 status is Open. Original cause of wound was Gradually Appeared. The wound is located on the [employer]. The wound measures 2.1cm length x 2.2cm width x 0.3cm depth; 3.629cm^2 area and 1.089cm^3 volume. There is Fat Layer (Subcutaneous Tissue) Exposed exposed. There is no tunneling or undermining noted. There is a medium amount of serosanguineous drainage noted. The wound margin is epibole. There is large (67-100%) pale granulation within the wound bed. There is a small (1-33%) amount of necrotic tissue within the wound bed including Adherent Slough. Wound #2 status is Open. Original cause of wound was Gradually Appeared. The wound is located on the [employer]. The wound  measures 0.7cm length x 0.7cm width x 0.5cm depth; 0.385cm^2 area and 0.192cm^3 volume. There is Fat Layer (Subcutaneous Tissue) Exposed exposed. There is no tunneling or undermining noted. There is a medium amount of serosanguineous drainage noted. The wound margin is epibole. There is medium (34-66%) pale granulation within the wound bed. There is a medium (34-66%) amount of necrotic tissue within the wound bed including Adherent Slough. Assessment Active Problems ICD-10 Type 2 diabetes mellitus with foot ulcer Non-pressure chronic ulcer of other part of right foot with fat layer exposed Non-pressure chronic ulcer of other part of left foot with fat layer exposed Type 2 diabetes  mellitus with diabetic peripheral angiopathy without gangrene Type 2 diabetes mellitus with diabetic polyneuropathy Procedures Wound #1 Pre-procedure diagnosis of Wound #1 is a Diabetic Wound/Ulcer of the Lower Extremity located on the Left,Plantar Toe Great .Severity of Tissue Pre Debridement is: Fat layer exposed. There was a Excisional Skin/Subcutaneous Tissue Debridement with a total area of 4.62 sq cm performed by Ricard Dillon, MD. With the following instrument(s): Curette to remove Viable tissue/material. Material removed includes Subcutaneous Tissue after achieving pain control using Lidocaine. No specimens were taken. A time out was conducted at 12:11, prior to the start of the procedure. A Moderate amount of bleeding was controlled with Pressure. The procedure was tolerated well. Post Debridement Measurements: 2.1cm length x 2.2cm width x 0.4cm depth; 1.451cm^3 volume. Character of Wound/Ulcer Post Debridement is stable. Severity of Tissue Post Debridement is: Fat layer exposed. Post procedure Diagnosis Wound #1: Same as Pre-Procedure Wound #2 Pre-procedure diagnosis of Wound #2 is a Diabetic Wound/Ulcer of the Lower Extremity located on the Right,Plantar Toe Great .Severity of Tissue Pre Debridement  is: Fat layer exposed. There was a Excisional Skin/Subcutaneous Tissue Debridement with a total area of 0.49 sq cm performed by Ricard Dillon, MD. With the following instrument(s): Curette to remove Viable tissue/material. Material removed includes Subcutaneous Tissue and Slough and after achieving pain control using Lidocaine. No specimens were taken. A time out was conducted at 12:11, prior to the start of the procedure. A Moderate amount of bleeding was controlled with Pressure. The procedure was tolerated well. Post Debridement Measurements: 0.7cm length x 0.7cm width x 0.6cm depth; 0.231cm^3 volume. Character of Wound/Ulcer Post Debridement is stable. Severity of Tissue Post Debridement is: Fat layer exposed. Melinda, [name] ([id number]) Post procedure Diagnosis Wound #2: Same as Pre-Procedure Plan Wound Cleansing: Wound #1 Left,Plantar Toe Great: Cleanse wound with mild soap and water No tub bath. Wound #2 Right,Plantar Toe Great: Cleanse wound with mild soap and water No tub bath. Anesthetic (add to Medication List): Wound #1 Left,Plantar Toe Great: Topical Lidocaine 4% cream applied to wound bed prior to debridement (In Clinic Only). Wound #2 Right,Plantar Toe Great: Topical Lidocaine 4% cream applied to wound bed prior to debridement (In Clinic Only). Primary Wound Dressing: Wound #1 Left,Plantar Toe Great: Silver Alginate Wound #2 Right,Plantar Toe Great: Silver Alginate Secondary Dressing: Wound #1 Left,Plantar Toe Great: Gauze and Kerlix/Conform Wound #2 Right,Plantar Toe Great: Gauze and Kerlix/Conform Dressing Change Frequency: Wound #1 Left,Plantar Toe Great: Change dressing every other day. Wound #2 Right,Plantar Toe Great: Change dressing every other day. Follow-up Appointments: Wound #1 Left,Plantar Toe Great: Return Appointment in 1 week. Wound #2 Right,Plantar Toe Great: Return Appointment in 1 week. Edema Control: Wound #1 Left,Plantar Toe  Great: Elevate legs to the level of the heart and pump ankles as often as possible Wound #2 Right,Plantar Toe Great: Elevate legs to the level of the heart and pump ankles as often as possible Off-Loading: Wound #1 Left,Plantar Toe Great: Open toe surgical shoe with peg assist. - Bilateral Wound #2 Right,Plantar Toe Great: Open toe surgical shoe with peg assist. - Bilateral Additional Orders / Instructions: Wound #1 Left,Plantar Toe Great: Stop Smoking Increase protein intake. Wound #2 Right,Plantar Toe Great: Stop Smoking Increase protein intake. Melinda, NEIGHBOR E. ([id number]) 1. Continue silver alginate to both wound areas 2. She comes in saying that the Pegasys shoe we gave her last week was 2 big she could not balance in it. She came in with some slippers that will not offload  these areas 3. She did not have any abnormalities by x-ray in terms of osteomyelitis 4. She bleeding briskly today. Previous revascularizations Electronic Signature(s) Signed: 03/17/2019 9:55:19 AM By: [name], [name], RN, [name], [name] RN, [name] Signed: 03/23/2019 8:18:45 AM By: Linton Ham MD Previous Signature: 03/07/2019 7:47:21 AM Version By: Linton Ham MD Entered By: [name], [name], RN, [name], [name] on 03/17/2019 09:55:19 Melinda [name] ([id number]) -------------------------------------------------------------------------------- SuperBill Details Patient Name: Melinda, [name] Date of Service: 03/05/2019 Medical Record Number: [id number] Patient Account Number: [id number] Date of Birth/Sex: 12/18/1955 (63 y.o. F) Treating RN: [name] Primary Care Provider: [name] Other Clinician: Referring Provider: [name] Treating Provider/Extender: Tito Dine in Treatment: 1 Diagnosis Coding ICD-10 Codes Code Description E11.621 Type 2 diabetes mellitus with foot ulcer L97.512 Non-pressure chronic ulcer of other part of right foot with fat layer exposed L97.522 Non-pressure chronic ulcer of  other part of left foot with fat layer exposed E11.51 Type 2 diabetes mellitus with diabetic peripheral angiopathy without gangrene E11.42 Type 2 diabetes mellitus with diabetic polyneuropathy Facility Procedures CPT4 Code: [id number] Description: [id number] - DEB SUBQ TISSUE 20 SQ CM/< ICD-10 Diagnosis Description E11.621 Type 2 diabetes mellitus with foot ulcer Modifier: Quantity: 1 Physician Procedures CPT4 Code: [id number] Description: [id number] - WC PHYS SUBQ TISS 20 SQ CM ICD-10 Diagnosis Description E11.621 Type 2 diabetes mellitus with foot ulcer Modifier: Quantity: 1 Electronic Signature(s) Signed: 03/12/2019 11:53:41 AM By: [name], [name], RN, [name], [name] RN, [name] Signed: 03/12/2019 5:41:49 PM By: Linton Ham MD Entered By: [name], [name], RN, [name], [name] on 03/12/2019 11:53:40

## 2019-03-07 NOTE — Progress Notes (Signed)
Melinda [name], Melinda [name] ([id number]) Visit Report for 03/05/2019 Arrival Information Details Patient Name: Melinda [name], Melinda [name] Date of Service: 03/05/2019 3:30 PM Medical Record Number: [id number] Patient Account Number: [id number] Date of Birth/Sex: 06-20-1955 (63 y.o. F) Treating RN: Army Melia Primary Care Vega Withrow: [name] Other Clinician: Referring Argie Applegate: [name] Treating Keshonna Valvo/Extender: Tito Dine in Treatment: 1 Visit Information History Since Last Visit Added or deleted any medications: No Patient Arrived: Ambulatory Any new allergies or adverse reactions: No Arrival Time: 11:36 Had a fall or experienced change in No Accompanied By: self activities of daily living that may affect Transfer Assistance: None risk of falls: Patient Identification Verified: Yes Signs or symptoms of abuse/neglect since last visito No Patient Has Alerts: Yes Hospitalized since last visit: No Patient Alerts: Patient on Blood Thinner Has Dressing in Place as Prescribed: Yes Plavix, Aspirin 81mg  Pain Present Now: No ABI's L .88/R .71 Electronic Signature(s) Signed: 03/05/2019 4:18:06 PM By: Army Melia Entered By: Army Melia on 03/05/2019 11:36:59 Melinda [name] ([id number]) -------------------------------------------------------------------------------- Encounter Discharge Information Details Patient Name: Melinda [name] Date of Service: 03/05/2019 3:30 PM Medical Record Number: [id number] Patient Account Number: [id number] Date of Birth/Sex: 16-May-1955 (63 y.o. F) Treating RN: [name] Primary Care Candy Leverett: [name] Other Clinician: Referring Cheryn Lundquist: [name] Treating Other Atienza/Extender: Tito Dine in Treatment: 1 Encounter Discharge Information Items Post Procedure Vitals Discharge Condition: Stable Temperature (F): 98.7 Ambulatory Status: Ambulatory Pulse (bpm): 87 Discharge Destination: Home Respiratory Rate (breaths/min):  16 Transportation: Private Auto Blood Pressure (mmHg): 137/55 Accompanied By: self Schedule Follow-up Appointment: No Clinical Summary of Care: Electronic Signature(s) Signed: 03/06/2019 5:19:38 PM By: [name], [name], RN, [name], [name] RN, [name] Entered By: [name], [name], RN, [name], [name] on 03/05/2019 12:18:21 Melinda [name] ([id number]) -------------------------------------------------------------------------------- Lower Extremity Assessment Details Patient Name: Melinda [name], Melinda [name] Date of Service: 03/05/2019 3:30 PM Medical Record Number: [id number] Patient Account Number: [id number] Date of Birth/Sex: 30-Mar-1956 (63 y.o. F) Treating RN: Army Melia Primary Care Basim Bartnik: [name] Other Clinician: Referring Damel Querry: [name] Treating Anthoney Sheppard/Extender: Ricard Dillon Weeks in Treatment: 1 Edema Assessment Assessed: [Left: No] [Right: No] Edema: [Left: No] [Right: No] Vascular Assessment Pulses: Dorsalis Pedis Palpable: [Left:Yes] [Right:Yes] Electronic Signature(s) Signed: 03/05/2019 4:18:06 PM By: Army Melia Entered By: Army Melia on 03/05/2019 11:43:03 Melinda [name] ([id number]) -------------------------------------------------------------------------------- Multi Wound Chart Details Patient Name: Melinda [name] Date of Service: 03/05/2019 3:30 PM Medical Record Number: [id number] Patient Account Number: [id number] Date of Birth/Sex: Apr 08, 1956 (63 y.o. 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 Comorbid History: Cataracts, Coronary Artery Cataracts, Coronary Artery N/A Disease, Hypertension, Disease, Hypertension, Myocardial Infarction, Myocardial Infarction, Peripheral Venous Disease, Peripheral Venous Disease, Type II Diabetes, Type II Diabetes, Osteoarthritis, Osteomyelitis, Osteoarthritis, Osteomyelitis, Neuropathy Neuropathy Date Acquired: 11/30/2018 03/31/2018 N/A Weeks of Treatment: 1 1 N/A Wound Status: Open Open N/A Measurements L x W x D 2.1x2.2x0.3 0.7x0.7x0.5 N/A (cm) Area (cm) : 3.629 0.385 N/A Volume (cm) : 1.089 0.192 N/A % Reduction in Area: -15.80% -309.60% N/A % Reduction in Volume: 30.50% -190.90% N/A  Classification: Grade 2 Grade 2 N/A Exudate Amount: Medium Medium N/A Exudate Type: Serosanguineous Serosanguineous N/A Exudate Color: red, brown red, brown N/A Wound Margin: Epibole Epibole N/A Granulation Amount: Large (67-100%) Medium (34-66%) N/A Granulation Quality: Pale Pale N/A Necrotic Amount: Small (1-33%) Medium (34-66%) N/A Exposed Structures: N/A Melinda [name], Melinda [name] ([id number]) Fat Layer (Subcutaneous Fat Layer (Subcutaneous Tissue) Exposed: Yes Tissue) Exposed: Yes Fascia: No Fascia: No Tendon: No Tendon: No Muscle: No Muscle: No Joint: No Joint: No Bone: No Bone: No Epithelialization: None None N/A Debridement: Debridement - Excisional Debridement - Excisional N/A Pre-procedure 12:11 12:11 N/A Verification/Time Out Taken: Pain Control: Lidocaine Lidocaine N/A Tissue Debrided: Subcutaneous Subcutaneous, Slough N/A Level: Skin/Subcutaneous Tissue Skin/Subcutaneous Tissue N/A Debridement Area (sq cm): 4.62 0.49 N/A Instrument: Curette Curette N/A Bleeding: Moderate Moderate N/A Hemostasis Achieved: Pressure Pressure N/A Debridement Treatment Procedure was tolerated well Procedure was tolerated well N/A Response: Post Debridement 2.1x2.2x0.4  0.7x0.7x0.6 N/A Measurements L x W x D (cm) Post Debridement Volume: 1.451 0.231 N/A (cm) Procedures Performed: Debridement Debridement N/A Treatment Notes Wound #1 (Left, Plantar Toe Great) Notes silvercel, gauze, conform with darco with peg assist Wound #2 (Right, Plantar Toe Great) Notes silvercel, gauze, conform with darco with peg assist Electronic Signature(s) Signed: 03/07/2019 7:47:21 AM By: Linton Ham MD Entered By: Linton Ham on 03/05/2019 12:24:46 Melinda [name] ([id number]) -------------------------------------------------------------------------------- [place] Details Patient Name: Melinda [name] Date of Service: 03/05/2019 3:30 PM Medical Record Number: [id number] Patient Account Number: [id number] Date of Birth/Sex: 1956-04-25 (63 y.o. F) Treating RN: [name] Primary Care Chadd Tollison: [name] Other Clinician: Referring Tye Juarez: [name] Treating Nyko Gell/Extender: Tito Dine in Treatment: 1 Active Inactive Medication Nursing Diagnoses: Knowledge deficit related to medication safety: actual or potential Goals: Patient/caregiver will demonstrate understanding of all current medications Date Initiated: 02/26/2019 Target Resolution Date: 02/26/2019 Goal Status: Active Interventions: Assess for medication contraindications each visit where new medications are prescribed Notes: Necrotic Tissue Nursing Diagnoses: Impaired tissue integrity related to necrotic/devitalized tissue Goals: Necrotic/devitalized tissue will be minimized in the wound bed Date Initiated: 02/26/2019 Target Resolution Date: 03/06/2019 Goal Status: Active Interventions: Assess patient pain level pre-, during and post procedure and prior to discharge Treatment Activities: Apply topical anesthetic as ordered : 02/26/2019 Notes: Orientation to the Wound Care Program Nursing Diagnoses: Knowledge deficit related to the wound healing  center program Goals: Patient/caregiver will verbalize understanding of the [facility] Date Initiated: 02/26/2019 Target Resolution Date: 02/26/2019 Goal Status: Active ENAYAH, OVERSON ([id number]) Interventions: Provide education on orientation to the wound center Notes: Pressure Nursing Diagnoses: Knowledge deficit related to causes and risk factors for pressure ulcer development Knowledge deficit related to management of pressures ulcers Goals: Patient will remain free from development of additional pressure ulcers Date Initiated: 02/26/2019 Target Resolution Date: 02/26/2019 Goal Status: Active Patient will remain free of pressure ulcers Date Initiated: 02/26/2019 Target Resolution Date: 02/26/2019 Goal Status: Active Patient/caregiver will verbalize understanding of pressure ulcer management Date Initiated: 02/26/2019 Target Resolution Date: 02/26/2019 Goal Status: Active Interventions: Assess: immobility, friction, shearing, incontinence upon admission and as needed Notes: Wound/Skin Impairment Nursing Diagnoses: Impaired tissue integrity Goals: Ulcer/skin breakdown will have a volume reduction of 30% by week 4 Date Initiated: 02/26/2019 Target Resolution Date: 03/29/2019 Goal Status: Active Interventions: Provide education on ulcer and skin care Treatment Activities: Topical wound management initiated : 02/26/2019 Notes: Electronic Signature(s) Signed: 03/06/2019 5:19:38 PM By: [name], [name], RN, [name], [name] RN, [name] Entered By: [name], [name], RN, [name], [name]  on 03/05/2019 12:11:58 Melinda [name], Melinda [name] ([id number]) -------------------------------------------------------------------------------- Pain Assessment Details Patient Name: Melinda [name], Melinda [name] Date of Service: 03/05/2019 3:30 PM Medical Record Number: [id number] Patient Account Number: [id number] Date of Birth/Sex: [birth year] (63 y.o. F) Treating RN: Army Melia Primary Care Gladine Plude: [name] Other  Clinician: Referring Berdella Bacot: [name] Treating Thomasenia Dowse/Extender: Tito Dine in Treatment: 1 Active Problems Location of Pain Severity and Description of Pain Patient Has Paino No Site Locations Pain Management and Medication Current Pain Management: Electronic Signature(s) Signed: 03/05/2019 4:18:06 PM By: Army Melia Entered By: Army Melia on 03/05/2019 11:37:15 Melinda [name] ([id number]) -------------------------------------------------------------------------------- Patient/Caregiver Education Details Patient Name: Melinda [name] Date of Service: 03/05/2019 3:30 PM Medical Record Number: [id number] Patient Account Number: [id number] Date of Birth/Gender: [birth year] (63 y.o. F) Treating RN: [name] Primary Care Physician: [name] Other Clinician: Referring Physician: [name] Treating Physician/Extender: Tito Dine in Treatment: 1 Education Assessment Education Provided To: Patient Education Topics Provided Wound Debridement: Handouts: Wound Debridement Methods: Demonstration, Explain/Verbal Responses: State content correctly Electronic Signature(s) Signed: 03/06/2019 5:19:38 PM By: [name], [name], RN, [name], [name] RN, [name] Entered By: [name], [name], RN, [name], [name] on 03/05/2019 12:16:55 Melinda [name] ([id number]) -------------------------------------------------------------------------------- Wound Assessment Details Patient Name: Melinda [name], Melinda [name] Date of Service: 03/05/2019 3:30 PM Medical Record Number: [id number] Patient Account Number: [id number] Date of Birth/Sex: Jul 14, 1955 (63 y.o. F) Treating RN: Army Melia Primary Care Asusena Sigley: [name] Other Clinician: Referring Monserrath Junio: [name] Treating Kyrollos Cordell/Extender: Tito Dine in Treatment: 1 Wound Status Wound Number: 1 Primary Diabetic Wound/Ulcer of the Lower Extremity Etiology: Wound Location: Left Toe Great - Plantar Wound Open Wounding Event:  Gradually Appeared Status: Date Acquired: 11/30/2018 Comorbid Cataracts, Coronary Artery Disease, Weeks Of Treatment: 1 History: Hypertension, Myocardial Infarction, Peripheral Clustered Wound: No Venous Disease, Type II Diabetes, Osteoarthritis, Osteomyelitis, Neuropathy Photos Wound Measurements Length: (cm) 2.1 % Reductio Width: (cm) 2.2 % Reductio Depth: (cm) 0.3 Epithelial Area: (cm) 3.629 Tunneling Volume: (cm) 1.089 Undermini n in Area: -15.8% n in Volume: 30.5% ization: None : No ng: No Wound Description Classification: Grade 2 Foul Odor Wound Margin: Epibole Slough/Fib Exudate Amount: Medium Exudate Type: Serosanguineous Exudate Color: red, brown After Cleansing: No rino Yes Wound Bed Granulation Amount: Large (67-100%) Exposed Structure Granulation Quality: Pale Fascia Exposed: No Necrotic Amount: Small (1-33%) Fat Layer (Subcutaneous Tissue) Exposed: Yes Necrotic Quality: Adherent Slough Tendon Exposed: No Muscle Exposed: No Joint Exposed: No Bone Exposed: No [place], [place]. ([id number]) Treatment Notes Wound #1 (Left, Plantar Toe Great) Notes silvercel, gauze, conform with darco with peg assist Electronic Signature(s) Signed: 03/05/2019 4:18:06 PM By: Army Melia Entered By: Army Melia on 03/05/2019 11:42:22 Melinda [name] ([id number]) -------------------------------------------------------------------------------- Wound Assessment Details Patient Name: Melinda [name] Date of Service: 03/05/2019 3:30 PM Medical Record Number: [id number] Patient Account Number: [id number] Date of Birth/Sex: 12-02-1955 (63 y.o. F) Treating RN: Army Melia Primary Care Journee Kohen: [name] Other Clinician: Referring Kenneshia Rehm: [name] Treating Luca Burston/Extender: Tito Dine in Treatment: 1 Wound Status Wound Number: 2 Primary Diabetic Wound/Ulcer of the Lower Extremity Etiology: Wound Location: Right Toe Great - Plantar Wound Open Wounding  Event: Gradually Appeared Status: Date Acquired: 03/31/2018 Comorbid Cataracts, Coronary Artery Disease, Weeks Of Treatment: 1 History: Hypertension, Myocardial Infarction, Peripheral Clustered Wound: No Venous Disease, Type II Diabetes, Osteoarthritis, Osteomyelitis, Neuropathy Photos Wound Measurements Length: (cm) 0.7 % Reduction Width: (cm) 0.7 % Reduction Depth: (cm) 0.5 Epitheliali Area: (cm) 0.385 Tunneling: Volume: (cm) 0.192 Underminin in  Area: -309.6% in Volume: -190.9% zation: None No g: No Wound Description Classification: Grade 2 Foul Odor A Wound Margin: Epibole Slough/Fibr Exudate Amount: Medium Exudate Type: Serosanguineous Exudate Color: red, brown fter Cleansing: No ino Yes Wound Bed Granulation Amount: Medium (34-66%) Exposed Structure Granulation Quality: Pale Fascia Exposed: No Necrotic Amount: Medium (34-66%) Fat Layer (Subcutaneous Tissue) Exposed: Yes Necrotic Quality: Adherent Slough Tendon Exposed: No Muscle Exposed: No Joint Exposed: No Bone Exposed: No [place], [place]. ([id number]) Treatment Notes Wound #2 (Right, Plantar Toe Great) Notes silvercel, gauze, conform with darco with peg assist Electronic Signature(s) Signed: 03/05/2019 4:18:06 PM By: Army Melia Entered By: Army Melia on 03/05/2019 11:42:43 Melinda [name] ([id number]) -------------------------------------------------------------------------------- Vitals Details Patient Name: Melinda [name] Date of Service: 03/05/2019 3:30 PM Medical Record Number: [id number] Patient Account Number: [id number] Date of Birth/Sex: [birth year] (63 y.o. F) Treating RN: Army Melia Primary Care Alyzae Hawkey: [name] Other Clinician: Referring Lennart Gladish: [name] Treating Jaison Petraglia/Extender: Tito Dine in Treatment: 1 Vital Signs Time Taken: 11:37 Temperature (F): 98.7 Height (in): 68 Pulse (bpm): 87 Weight (lbs): 300 Respiratory Rate (breaths/min): 16 Body Mass  Index (BMI): 45.6 Blood Pressure (mmHg): 137/55 Reference Range: 80 - 120 mg / dl Electronic Signature(s) Signed: 03/05/2019 4:18:06 PM By: Army Melia Entered By: Army Melia on 03/05/2019 11:39:05

## 2019-03-12 ENCOUNTER — Ambulatory Visit: Payer: Medicare HMO | Admitting: Internal Medicine

## 2019-03-13 ENCOUNTER — Encounter: Payer: Medicare HMO | Admitting: Physician Assistant

## 2019-03-13 ENCOUNTER — Other Ambulatory Visit: Payer: Self-pay

## 2019-03-13 DIAGNOSIS — E11621 Type 2 diabetes mellitus with foot ulcer: Secondary | ICD-10-CM | POA: Diagnosis not present

## 2019-03-13 NOTE — Progress Notes (Addendum)
[name], [name] ([id number]) Visit Report for 03/13/2019 Chief Complaint Document Details Patient Name: Melinda [name], Melinda [name] Date of Service: 03/13/2019 10:45 AM Medical Record Number: [id number] Patient Account Number: [id number] Date of Birth/Sex: [birth year] (63 y.o. F) Treating RN: Army Melia Primary Care Provider: [name] Other Clinician: Referring Provider: [name] Treating Provider/Extender: [name], [name]: 2 Information Obtained from: Patient Chief Complaint 02/26/2019; patient is here for review of wounds on her bilateral plantar feet Electronic Signature(s) Signed: 03/13/2019 11:02:41 AM By: [name] [name] Entered By: [name] on 03/13/2019 11:02:40 Melinda [name] ([id number]) -------------------------------------------------------------------------------- Debridement Details Patient Name: Melinda [name] Date of Service: 03/13/2019 10:45 AM Medical Record Number: [id number] Patient Account Number: [id number] Date of Birth/Sex: 03/20/1956 (63 y.o. F) Treating RN: Army Melia Primary Care Provider: [name] Other Clinician: Referring Provider: [name] Treating Provider/Extender: [name], [name]: 2 Debridement Performed for Wound #1 Left,Plantar Toe Great Assessment: Performed By: Physician [name], [name] E., [name] Debridement Type: Debridement Severity of Tissue Pre Fat layer exposed Debridement: Level of Consciousness (Pre- Awake and Alert procedure): Pre-procedure Verification/Time Yes - 11:09 Out Taken: Start Time: 11:10 Pain Control: Lidocaine Total Area Debrided (L x W): 1.5 (cm) x 1.7 (cm) = 2.55 (cm) Tissue and other material Viable, Non-Viable, Callus, Slough, Subcutaneous, Biofilm, Slough debrided: Level: Skin/Subcutaneous Tissue Debridement Description: Excisional Instrument: Curette Bleeding: Minimum Hemostasis Achieved: Pressure End Time: 11:12 Response to Treatment:  Procedure was tolerated well Level of Consciousness Awake and Alert (Post-procedure): Post Debridement Measurements of Total Wound Length: (cm) 1.5 Width: (cm) 1.7 Depth: (cm) 0.3 Volume: (cm) 0.601 Character of Wound/Ulcer Post Debridement: Stable Severity of Tissue Post Debridement: Fat layer exposed Post Procedure Diagnosis Same as Pre-procedure Electronic Signature(s) Signed: 03/13/2019 3:26:34 PM By: Army Melia Signed: 03/13/2019 3:41:54 PM By: [name] [name] Entered By: Army Melia on 03/13/2019 11:11:05 Melinda [name] ([id number]) -------------------------------------------------------------------------------- Debridement Details Patient Name: Melinda [name] Date of Service: 03/13/2019 10:45 AM Medical Record Number: [id number] Patient Account Number: [id number] Date of Birth/Sex: [birth year] (63 y.o. F) Treating RN: Army Melia Primary Care Provider: [name] Other Clinician: Referring Provider: [name] Treating Provider/Extender: [name], [name]: 2 Debridement Performed for Wound #2 Right,Plantar Toe Great Assessment: Performed By: Physician [name], [name] E., [name] Debridement Type: Debridement Severity of Tissue Pre Fat layer exposed Debridement: Level of Consciousness (Pre- Awake and Alert procedure): Pre-procedure Verification/Time Yes - 11:09 Out Taken: Start Time: 11:10 Pain Control: Lidocaine Total Area Debrided (L x W): 0.3 (cm) x 0.2 (cm) = 0.06 (cm) Tissue and other material Viable, Non-Viable, Callus, Slough, Subcutaneous, Slough debrided: Level: Skin/Subcutaneous Tissue Debridement Description: Excisional Instrument: Curette Bleeding: Minimum Hemostasis Achieved: Pressure End Time: 11:12 Response to Treatment: Procedure was tolerated well Level of Consciousness Awake and Alert (Post-procedure): Post Debridement Measurements of Total Wound Length: (cm) 0.5 Width: (cm) 0.3 Depth: (cm) 0.2 Volume:  (cm) 0.024 Character of Wound/Ulcer Post Debridement: Stable Severity of Tissue Post Debridement: Fat layer exposed Post Procedure Diagnosis Same as Pre-procedure Electronic Signature(s) Signed: 03/13/2019 3:26:34 PM By: Army Melia Signed: 03/13/2019 3:41:54 PM By: [name] [name] Entered By: Army Melia on 03/13/2019 11:24:09 Melinda [name] ([id number]) -------------------------------------------------------------------------------- HPI Details Patient Name: Melinda [name] Date of Service: 03/13/2019 10:45 AM Medical Record Number: [id number] Patient Account Number: [id number] Date of Birth/Sex: [birth year] (63 y.o. F) Treating RN: Army Melia Primary Care Provider: [name] Other Clinician: Referring Provider: [name] Treating  Provider/Extender: [name], [name]: 2 History of Present Illness HPI Description: ADMISSION 02/26/2019 Patient is a 63 year old type II diabetic on insulin with significant polyneuropathy. She has been followed by Dr. Sherren Mocha cline of podiatry for problems related to her feet dating back to the early part of 2019 as I can review in [place] link. 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 Subsequently she was seen by Dr. Lucky Cowboy of vascular surgery and had stents x2 placed in her left SFA as well as left SFA angioplasties on 05/20/2017. She was noted to have a wound on her left foot in October 2019. In August 2020 on 8/24 she underwent a right anterior tibial artery angioplasty a right tibioperoneal trunk angioplasty and a right SFA angioplasty. The patient states that she developed a left great toe wound in August which is at the base of her previous partial amputation in this area. She tells [name] that she has had a right great toe wound since December 2019 and she has been using Santyl to both of these areas that she received from a fellow parishioner at her church. By enlarge she  has been using Neosporin to these areas and not offloading them specifically Arterial studies on 9/22 showed an ABI on the right of 0.71 with triphasic waveforms on the left at 0.88 with triphasic and biphasic waveforms. TBI's on the right and 0.44 and on the left at 1.05. 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 11//20; x-rays I did last week were both negative for osteomyelitis. She has a fairly large wound at the base of her left first toe and a small punched out area on the right first toe. We use silver alginate last week 03/13/2019 upon evaluation today patient appears to be doing okay with regard to her wounds at this point. She does have some callus buildup noted upon evaluation at this point. Fortunately there is no evidence of active infection which is also good news. I am going to have to perform some debridement to clear away some of the necrotic tissue today. Electronic Signature(s) Signed: 03/13/2019 11:26:20 AM By: [name] [name] Entered By: [name] on 03/13/2019 11:26:20 Melinda [name] ([id number]) -------------------------------------------------------------------------------- Physical Exam Details Patient Name: Melinda [name] Date of Service: 03/13/2019 10:45 AM Medical Record Number: [id number] Patient Account Number: [id number] Date of Birth/Sex: Jun 28, 1955 (63 y.o. F) Treating RN: Army Melia Primary Care Provider: [name] Other Clinician: Referring Provider: [name] Treating Provider/Extender: [name], [name]: 2 Constitutional Well-nourished and well-hydrated in no acute distress. Psychiatric this patient is able to make decisions and demonstrates good insight into disease process. Alert and Oriented x 3. pleasant and cooperative. Notes Patient's wounds  again did require sharp debridement to clear away some of the necrotic tissue that was built up including callus around the edges of the wound and slough on the surface of the wound. I performed this carefully today and patient bled a little bit but fortunately this was nothing too significant at this point. Post debridement the wound bed appeared to be doing much better the surrounding callus was trimmed down quite nicely and overall I am very pleased with the appearance at this point. Electronic Signature(s) Signed: 03/13/2019 11:27:58 AM By: [name] [name] Entered By: [name] on  03/13/2019 11:27:58 Melinda [name] ([id number]) -------------------------------------------------------------------------------- Physician Orders Details Patient Name: Melinda [name] Date of Service: 03/13/2019 10:45 AM Medical Record Number: [id number] Patient Account Number: [id number] Date of Birth/Sex: [birth year] (63 y.o. F) Treating RN: Army Melia Primary Care Provider: [name] Other Clinician: Referring Provider: [name] Treating Provider/Extender: [name], [name]: 2 Verbal / Phone Orders: No Diagnosis Coding ICD-10 Coding Code Description E11.621 Type 2 diabetes mellitus with foot ulcer L97.512 Non-pressure chronic ulcer of other part of right foot with fat layer exposed 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sed E11.51 Type 2 diabetes mellitus with diabetic peripheral angiopathy without gangrene E11.42 Type 2 diabetes mellitus with diabetic polyneuropathy Wound Cleansing Wound #1 Left,Plantar Toe Great o Cleanse wound with mild soap and water o No tub bath. Wound #2 Right,Plantar Toe Great o Cleanse wound with mild soap and water o No tub bath. Anesthetic (add to Medication List) Wound #1 Left,Plantar Toe Great o Topical Lidocaine 4% cream applied to wound bed prior to debridement (In Clinic  Only). Wound #2 Right,Plantar Toe Great o Topical Lidocaine 4% cream applied to wound bed prior to debridement (In Clinic Only). Primary Wound Dressing Wound #1 Left,Plantar Toe Great o Silver Collagen Wound #2 Right,Plantar Toe Great o Silver Collagen Secondary Dressing Wound #1 Left,Plantar Toe Great o Gauze and Kerlix/Conform o Foam Wound #2 Right,Plantar Toe Great o Gauze and Kerlix/Conform o Foam Dressing Change Frequency Wound #1 Left,Plantar Toe Melinda [name], Melinda E. ([id number]) o Change dressing every other day. Wound #2 Right,Plantar Toe Great o Change dressing every other day. Follow-up Appointments Wound #1 Left,Plantar Toe Great o Return Appointment in 1 week. Wound #2 Right,Plantar Toe Great o Return Appointment in 1 week. Edema Control Wound #1 Left,Plantar Toe Great o Elevate legs to the level of the heart and pump ankles as often as possible Wound #2 Right,Plantar Toe Great o Elevate legs to the level of the heart and pump ankles as often as possible Off-Loading Wound #1 Left,Plantar Toe Great o Open toe surgical shoe with peg assist. - Bilateral Wound #2 Right,Plantar Toe Great o Open toe surgical shoe with peg assist. - Bilateral Additional Orders / Instructions Wound #1 Left,Plantar Toe Great o Stop Smoking o Increase protein intake. Wound #2 Right,Plantar Toe Great o Stop Smoking o Increase protein intake. Electronic Signature(s) Signed: 03/13/2019 3:26:34 PM By: Army Melia Signed: 03/13/2019 3:41:54 PM By: [name] [name] Entered By: Army Melia on 03/13/2019 11:25:19 Melinda [name] ([id number]) -------------------------------------------------------------------------------- Problem List Details Patient Name: REIGHN, ADAMEC. Date of Service: 03/13/2019 10:45 AM Medical Record Number: [id number] Patient Account Number: [id number] Date of Birth/Sex: 1956-03-05 (63 y.o. F) Treating RN: Army Melia Primary Care Provider: [name] Other Clinician: Referring Provider: [name] Treating Provider/Extender: [name], [name]: 2 Active Problems ICD-10 Evaluated Encounter Code Description Active Date Today Diagnosis E11.621 Type 2 diabetes mellitus with foot ulcer 02/26/2019 No Yes L97.512 Non-pressure chronic ulcer of other part of right foot with fat 02/26/2019 No Yes layer exposed L97.522 Non-pressure chronic ulcer of other part of left foot with fat 02/26/2019 No Yes layer exposed E11.51 Type 2 diabetes mellitus with diabetic peripheral angiopathy 02/26/2019 No Yes without gangrene E11.42 Type 2 diabetes mellitus with diabetic polyneuropathy 02/26/2019 No Yes Inactive Problems Resolved Problems Electronic Signature(s) Signed: 03/13/2019 11:02:31 AM By: [name] [name] Entered By: [name] on 03/13/2019 11:02:30 Melinda [name] ([id number]) --------------------------------------------------------------------------------  Progress Note Details Patient Name: Melinda [name], Melinda [name] Date of Service: 03/13/2019 10:45 AM Medical Record Number: [id number] Patient Account Number: [id number] Date of Birth/Sex: 1955/09/30 (63 y.o. F) Treating RN: Army Melia Primary Care Provider: [name] Other Clinician: Referring Provider: [name] Treating Provider/Extender: [name], [name]: 2 Subjective Chief Complaint Information obtained from Patient 02/26/2019; patient is here for review of wounds on her bilateral plantar feet History of Present Illness (HPI) ADMISSION 02/26/2019 Patient is a 63 year old type II diabetic on insulin with significant polyneuropathy. She has been followed by Dr. Sherren Mocha cline of podiatry for problems related to her feet dating back to the early part of 2019 as I can review in [place] link. 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 Subsequently she  was seen by Dr. Lucky Cowboy of vascular surgery and had stents x2 placed in her left SFA as well as left SFA angioplasties on 05/20/2017. She was noted to have a wound on her left foot in October 2019. In August 2020 on 8/24 she underwent a right anterior tibial artery angioplasty a right tibioperoneal trunk angioplasty and a right SFA angioplasty. The patient states that she developed a left great toe wound in August which is at the base of her previous partial amputation in this area. She tells [name] that she has had a right great toe wound since December 2019 and she has been using Santyl to both of these areas that she received from a fellow parishioner at her church. By enlarge she has been using Neosporin to these areas and not offloading them specifically Arterial studies on 9/22 showed an ABI on the right of 0.71 with triphasic waveforms on the left at 0.88 with triphasic and biphasic waveforms. TBI's on the right and 0.44 and on the left at 1.05. 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 11//20; x-rays I did last week were both negative for osteomyelitis. She has a fairly large wound at the base of her left first toe and a small punched out area on the right first toe. We use silver alginate last week 03/13/2019 upon evaluation today patient appears to be doing okay with regard to her wounds at this point. She does have some callus buildup noted upon evaluation at this point. Fortunately there is no evidence of active infection which is also good news. I am going to have to perform some debridement to clear away some of the necrotic tissue today. Patient History Information obtained from Patient. Family History Diabetes - Mother,Father, Heart Disease - Mother,Father, Hypertension - Mother, No family history of Cancer, Hereditary  Spherocytosis, Kidney Disease, Lung Disease, Seizures, Stroke, Thyroid Problems, Tuberculosis. Social History Current every day smoker - 49 years, Alcohol Use - Never, Drug Use - No History, Caffeine Use - Daily. Medical History Eyes CRUSITA, PILLE ([id number]) Patient has history of Cataracts Denies history of Glaucoma Ear/Nose/Mouth/Throat Denies history of Chronic sinus problems/congestion, Middle ear problems Hematologic/Lymphatic Denies history of Anemia, Hemophilia, Human Immunodeficiency Virus, Lymphedema, Sickle Cell Disease Respiratory Denies history of Aspiration, Asthma, Chronic Obstructive Pulmonary Disease (COPD), Pneumothorax, Sleep Apnea, Tuberculosis Cardiovascular Patient has history of Coronary Artery Disease, Hypertension, Myocardial Infarction, Peripheral Venous Disease Denies history of Angina, Arrhythmia, Congestive Heart Failure, Deep Vein Thrombosis, Hypotension, Phlebitis, Vasculitis Gastrointestinal Denies history of Cirrhosis , Colitis, Crohn s, Hepatitis A,  Hepatitis B, Hepatitis C Endocrine Patient has history of Type II Diabetes - IDDM Denies history of Type I Diabetes Genitourinary Denies history of End Stage Renal Disease Immunological Denies history of Lupus Erythematosus, Raynaud s, Scleroderma Integumentary (Skin) Denies history of History of Burn, History of pressure wounds Musculoskeletal Patient has history of Osteoarthritis - knee bilateral, Osteomyelitis Neurologic Patient has history of Neuropathy - peripheral Denies history of Dementia, Quadriplegia, Paraplegia, Seizure Disorder Medical And Surgical History Notes Cardiovascular ischemic cardiomyopathy, hyperlipidemia Review of Systems (ROS) Constitutional Symptoms (General Health) Denies complaints or symptoms of Fatigue, Fever, Chills, Marked Weight Change. Respiratory Denies complaints or symptoms of Chronic or frequent coughs, Shortness of Breath. Cardiovascular Denies  complaints or symptoms of Chest pain, LE edema. Psychiatric Denies complaints or symptoms of Anxiety, Claustrophobia. Objective Constitutional Well-nourished and well-hydrated in no acute distress. Vitals Time Taken: 10:55 AM, Height: 68 in, Weight: 300 lbs, BMI: 45.6, Temperature: 99.3 F, Pulse: 91 bpm, Respiratory Rate: 16 breaths/min, Blood Pressure: 118/47 mmHg. Melinda [name], Melinda [name] ([id number]) Psychiatric this patient is able to make decisions and demonstrates good insight into disease process. Alert and Oriented x 3. pleasant and cooperative. General Notes: Patient's wounds again did require sharp debridement to clear away some of the necrotic tissue that was built up including callus around the edges of the wound and slough on the surface of the wound. I performed this carefully today and patient bled a little bit but fortunately this was nothing too significant at this point. Post debridement the wound bed appeared to be doing much better the surrounding callus was trimmed down quite nicely and overall I am very pleased with the appearance at this point. Integumentary (Hair, Skin) Wound #1 status is Open. Original cause of wound was Gradually Appeared. The wound is located on the [employer]. The wound measures 1.5cm length x 1.7cm width x 0.3cm depth; 2.003cm^2 area and 0.601cm^3 volume. There is Fat Layer (Subcutaneous Tissue) Exposed exposed. There is no tunneling or undermining noted. There is a medium amount of serosanguineous drainage noted. The wound margin is epibole. There is large (67-100%) pale granulation within the wound bed. There is a small (1-33%) amount of necrotic tissue within the wound bed including Adherent Slough. Wound #2 status is Open. Original cause of wound was Gradually Appeared. The wound is located on the [employer]. The wound measures 0.3cm length x 0.2cm width x 0.5cm depth; 0.047cm^2 area and 0.024cm^3 volume. There is Fat Layer  (Subcutaneous Tissue) Exposed exposed. There is no tunneling noted, however, there is undermining starting at 12:00 and ending at 12:00 with a maximum distance of 0.2cm. There is a medium amount of serosanguineous drainage noted. The wound margin is epibole. There is small (1-33%) pale granulation within the wound bed. There is a large (67- 100%) amount of necrotic tissue within the wound bed including Adherent Slough. Assessment Active Problems ICD-10 Type 2 diabetes mellitus with foot ulcer Non-pressure chronic ulcer of other part of right foot with fat layer exposed Non-pressure chronic ulcer of other part of left foot with fat layer exposed Type 2 diabetes mellitus with diabetic peripheral angiopathy without gangrene Type 2 diabetes mellitus with diabetic polyneuropathy Procedures Wound #1 Pre-procedure diagnosis of Wound #1 is a Diabetic Wound/Ulcer of the Lower Extremity located on the Left,Plantar Toe Great .Severity of Tissue Pre Debridement is: Fat layer exposed. There was a Excisional Skin/Subcutaneous Tissue Debridement with a total area of 2.55 sq cm performed by [name], [name] E., [name]. With the  following instrument(s): Curette to remove Viable and Non-Viable tissue/material. Material removed includes Callus, Subcutaneous Tissue, Slough, and Biofilm after achieving pain control using Lidocaine. A time out was conducted at 11:09, prior to the start of the procedure. A Minimum amount of bleeding was controlled with Pressure. The procedure was tolerated well. Post Debridement Measurements: 1.5cm length x 1.7cm width x 0.3cm depth; 0.601cm^3 volume. Character of Wound/Ulcer Post Debridement is stable. Severity of Tissue Post Debridement is: Fat layer exposed. Post procedure Diagnosis Wound #1: Same as Pre-Procedure Melinda [name], Melinda [name] ([id number]) Wound #2 Pre-procedure diagnosis of Wound #2 is a Diabetic Wound/Ulcer of the Lower Extremity located on the Right,Plantar Toe Great  .Severity of Tissue Pre Debridement is: Fat layer exposed. There was a Excisional Skin/Subcutaneous Tissue Debridement with a total area of 0.06 sq cm performed by [name], [name] E., [name]. With the following instrument(s): Curette to remove Viable and Non-Viable tissue/material. Material removed includes Callus, Subcutaneous Tissue, and Slough after achieving pain control using Lidocaine. A time out was conducted at 11:09, prior to the start of the procedure. A Minimum amount of bleeding was controlled with Pressure. The procedure was tolerated well. Post Debridement Measurements: 0.5cm length x 0.3cm width x 0.2cm depth; 0.024cm^3 volume. Character of Wound/Ulcer Post Debridement is stable. Severity of Tissue Post Debridement is: Fat layer exposed. Post procedure Diagnosis Wound #2: Same as Pre-Procedure Plan Wound Cleansing: Wound #1 Left,Plantar Toe Great: Cleanse wound with mild soap and water No tub bath. Wound #2 Right,Plantar Toe Great: Cleanse wound with mild soap and water No tub bath. Anesthetic (add to Medication List): Wound #1 Left,Plantar Toe Great: Topical Lidocaine 4% cream applied to wound bed prior to debridement (In Clinic Only). Wound #2 Right,Plantar Toe Great: Topical Lidocaine 4% cream applied to wound bed prior to debridement (In Clinic Only). Primary Wound Dressing: Wound #1 Left,Plantar Toe Great: Silver Collagen Wound #2 Right,Plantar Toe Great: Silver Collagen Secondary Dressing: Wound #1 Left,Plantar Toe Great: Gauze and Kerlix/Conform Foam Wound #2 Right,Plantar Toe Great: Gauze and Kerlix/Conform Foam Dressing Change Frequency: Wound #1 Left,Plantar Toe Great: Change dressing every other day. Wound #2 Right,Plantar Toe Great: Change dressing every other day. Follow-up Appointments: Wound #1 Left,Plantar Toe Great: Return Appointment in 1 week. Wound #2 Right,Plantar Toe Great: Return Appointment in 1 week. Edema Control: Wound #1  Left,Plantar Toe Great: Elevate legs to the level of the heart and pump ankles as often as possible Wound #2 Right,Plantar Toe Great: Elevate legs to the level of the heart and pump ankles as often as possible Off-Loading: Melinda [name], Melinda E. ([id number]) Wound #1 Left,Plantar Toe Great: Open toe surgical shoe with peg assist. - Bilateral Wound #2 Right,Plantar Toe Great: Open toe surgical shoe with peg assist. - Bilateral Additional Orders / Instructions: Wound #1 Left,Plantar Toe Great: Stop Smoking Increase protein intake. Wound #2 Right,Plantar Toe Great: Stop Smoking Increase protein intake. 1. My suggestion at this time is good to be that we go ahead and can that we can go ahead and switch to a silver collagen dressing as I feel like this may be better for the patient at this time to help with new epithelization. 2. I am also going to recommend at this point that we can go ahead and continue to secure everything with gauze and con form along with the foam as previous I think all that is doing quite well for her. 3. I would recommend that she continue to wear the postop surgical shoe with peg assist. I think  this is best to help with appropriate offloading. We will see patient back for reevaluation in 1 week here in the clinic. If anything worsens or changes patient will contact our office for additional recommendations. Electronic Signature(s) Signed: 03/13/2019 11:28:41 AM By: [name] [name] Entered By: [name] on 03/13/2019 11:28:41 Melinda [name] ([id number]) -------------------------------------------------------------------------------- ROS/PFSH Details Patient Name: Melinda [name] Date of Service: 03/13/2019 10:45 AM Medical Record Number: [id number] Patient Account Number: [id number] Date of Birth/Sex: 09/18/1955 (63 y.o. F) Treating RN: Army Melia Primary Care Provider: [name] Other Clinician: Referring Provider: [name] Treating  Provider/Extender: [name], [name]: 2 Information Obtained From Patient Constitutional Symptoms (General Health) Complaints and Symptoms: Negative for: Fatigue; Fever; Chills; Marked Weight Change Respiratory Complaints and Symptoms: Negative for: Chronic or frequent coughs; Shortness of Breath Medical History: Negative for: Aspiration; Asthma; Chronic Obstructive Pulmonary Disease (COPD); Pneumothorax; Sleep Apnea; Tuberculosis Cardiovascular Complaints and Symptoms: Negative for: Chest pain; LE edema Medical History: Positive for: Coronary Artery Disease; Hypertension; Myocardial Infarction; Peripheral Venous Disease Negative for: Angina; Arrhythmia; Congestive Heart Failure; Deep Vein Thrombosis; Hypotension; Phlebitis; Vasculitis Past Medical History Notes: ischemic cardiomyopathy, hyperlipidemia Psychiatric Complaints and Symptoms: Negative for: Anxiety; Claustrophobia Eyes Medical History: Positive for: Cataracts Negative for: Glaucoma Ear/Nose/Mouth/Throat Medical History: Negative for: Chronic sinus problems/congestion; Middle ear problems Hematologic/Lymphatic Medical History: Negative for: Anemia; Hemophilia; Human Immunodeficiency Virus; Lymphedema; Sickle Cell Disease Gastrointestinal Melinda [name], Melinda [name] ([id number]) Medical History: Negative for: Cirrhosis ; Colitis; Crohnos; Hepatitis A; Hepatitis B; Hepatitis C Endocrine Medical History: Positive for: Type II Diabetes - IDDM Negative for: Type I Diabetes Time with diabetes: 51 years Treated with: Insulin Blood sugar tested every day: Yes Tested : Blood sugar testing results: Breakfast: 82 Genitourinary Medical History: Negative for: End Stage Renal Disease Immunological Medical History: Negative for: Lupus Erythematosus; Raynaudos; Scleroderma Integumentary (Skin) Medical History: Negative for: History of Burn; History of pressure wounds Musculoskeletal Medical History: Positive  for: Osteoarthritis - knee bilateral; Osteomyelitis Neurologic Medical History: Positive for: Neuropathy - peripheral Negative for: Dementia; Quadriplegia; Paraplegia; Seizure Disorder HBO Extended History Items Eyes: Cataracts Immunizations Pneumococcal Vaccine: Received Pneumococcal Vaccination: No Implantable Devices None Family and Social History Cancer: No; Diabetes: Yes - Mother,Father; Heart Disease: Yes - Mother,Father; Hereditary Spherocytosis: No; Hypertension: Yes - Mother; Kidney Disease: No; Lung Disease: No; Seizures: No; Stroke: No; Thyroid Problems: No; Tuberculosis: No; Current every day smoker - 49 years; Alcohol Use: Never; Drug Use: No History; Caffeine Use: Daily; Financial Concerns: No; Food, Clothing or Shelter Needs: No; Support System Lacking: No; Transportation Concerns: No Physician Affirmation Melinda [name], Melinda [name] ([id number]) I have reviewed and agree with the above information. Electronic Signature(s) Signed: 03/13/2019 3:26:34 PM By: Army Melia Signed: 03/13/2019 3:41:54 PM By: [name] [name] Entered By: [name] on 03/13/2019 11:26:41 Melinda [name] ([id number]) -------------------------------------------------------------------------------- [place] Details Patient Name: Melinda [name] Date of Service: 03/13/2019 Medical Record Number: [id number] Patient Account Number: [id number] Date of Birth/Sex: [birth year] (63 y.o. 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sed E11.51 Type 2 diabetes mellitus with diabetic peripheral angiopathy  without gangrene E11.42 Type 2 diabetes mellitus with diabetic  polyneuropathy Facility Procedures CPT4 Code: [id number] Description: [id number] - DEB SUBQ TISSUE 20 SQ CM/< ICD-10 Diagnosis Description L97.512 Non-pressure chronic ulcer of other part of right foot with fat L97.522 Non-pressure chronic ulcer of other part of left foot with fat Modifier: layer exposed layer exposed Quantity: 1 Physician Procedures CPT4 Code: [id number] Description: [id number] - WC PHYS SUBQ TISS 20 SQ CM ICD-10 Diagnosis Description L97.512 Non-pressure chronic ulcer of other part of right foot with fat L97.522 Non-pressure chronic ulcer of other part of left foot with fat Modifier: layer exposed layer exposed Quantity: 1 Electronic Signature(s) Signed: 03/13/2019 11:28:56 AM By: [name] [name] Entered By: [name] on 03/13/2019 11:28:55

## 2019-03-14 NOTE — Progress Notes (Signed)
Melinda [name], Melinda [name] ([id number]) Visit Report for 03/13/2019 Arrival Information Details Patient Name: Melinda [name], Melinda [name] Date of Service: 03/13/2019 10:45 AM Medical Record Number: [id number] Patient Account Number: [id number] Date of Birth/Sex: 1955-07-16 (63 y.o. F) Treating RN: [name] Primary Care Tabor Denham: [name] Other Clinician: Referring [place]: [name] Treating Dreden Rivere/Extender: [name], [name]: 2 Visit Information History Since Last Visit Added or deleted any medications: Yes Patient Arrived: Ambulatory Any new allergies or adverse reactions: No Arrival Time: 10:54 Had a fall or experienced change in No Accompanied By: self activities of daily living that may affect Transfer Assistance: None risk of falls: Patient Identification Verified: Yes Signs or symptoms of abuse/neglect since last visito No Secondary Verification Process Yes Hospitalized since last visit: No Completed: Implantable device outside of the clinic excluding No Patient Has Alerts: Yes cellular tissue based products placed in the center Patient Alerts: Patient on Blood since last visit: Thinner Has Dressing in Place as Prescribed: Yes Plavix, Aspirin 81mg  Pain Present Now: No ABI's L .88/R .71 Electronic Signature(s) Signed: 03/14/2019 3:15:39 PM By: [name], [name], RN, [name], [name] RN, [name] Entered By: [name], [name], RN, [name], [name] on 03/13/2019 10:55:26 Melinda [name] ([id number]) -------------------------------------------------------------------------------- Encounter Discharge Information Details Patient Name: Melinda [name] Date of Service: 03/13/2019 10:45 AM Medical Record Number: [id number] Patient Account Number: [id number] Date of Birth/Sex: 1956-03-25 (63 y.o. F) Treating RN: Army Melia Primary Care Kleigh Hoelzer: [name] Other Clinician: Referring Stepehn Eckard: [name] Treating Metztli Sachdev/Extender: [name], [name]: 2 Encounter Discharge  Information Items Post Procedure Vitals Discharge Condition: Stable Temperature (F): 99.3 Ambulatory Status: Ambulatory Pulse (bpm): 91 Discharge Destination: Home Respiratory Rate (breaths/min): 16 Transportation: Private Auto Blood Pressure (mmHg): 118/47 Accompanied By: self Schedule Follow-up Appointment: Yes Clinical Summary of Care: Electronic Signature(s) Signed: 03/13/2019 3:26:34 PM By: Army Melia Entered By: Army Melia on 03/13/2019 11:26:37 Melinda [name] ([id number]) -------------------------------------------------------------------------------- Lower Extremity Assessment Details Patient Name: Melinda [name] Date of Service: 03/13/2019 10:45 AM Medical Record Number: [id number] Patient Account Number: [id number] Date of Birth/Sex: [birth year] (63 y.o. F) Treating RN: [name] Primary Care Harvest Deist: [name] Other Clinician: Referring Ercole Georg: [name] Treating Raena Pau/Extender: [name], [name]: 2 Edema Assessment Assessed: [Left: Yes] [Right: Yes] Edema: [Left: No] [Right: No] Vascular Assessment Pulses: Dorsalis Pedis Palpable: [Left:Yes] [Right:Yes] Electronic Signature(s) Signed: 03/14/2019 3:15:39 PM By: [name], [name], RN, [name], [name] RN, [name] Entered By: [name], [name], RN, [name], [name] on 03/13/2019 10:58:53 Melinda [name] ([id number]) -------------------------------------------------------------------------------- Multi Wound Chart Details Patient Name: Melinda [name] Date of Service: 03/13/2019 10:45 AM Medical Record Number: [id number] Patient Account Number: [id number] Date of Birth/Sex: 1955-12-27 (63 y.o. F) Treating RN: Army Melia Primary Care Marquett Bertoli: [name] Other Clinician: Referring Jahmere Bramel: [name] Treating Mele Sylvester/Extender: [name], [name]: 2 Vital Signs Height(in): 68 Pulse(bpm): 91 Weight(lbs): 300 Blood Pressure(mmHg): 118/47 Body Mass Index(BMI): 46 Temperature(F):  99.3 Respiratory Rate 16 (breaths/min): Photos: [N/A:N/A] Wound Location: Left Toe Great - Plantar Right Toe Great - Plantar N/A Wounding Event: Gradually Appeared Gradually Appeared N/A Primary Etiology: Diabetic Wound/Ulcer of the Diabetic Wound/Ulcer of the N/A Lower Extremity Lower Extremity Comorbid History: Cataracts, Coronary Artery Cataracts, Coronary Artery N/A Disease, Hypertension, Disease, Hypertension, Myocardial Infarction, Myocardial Infarction, Peripheral Venous Disease, Peripheral Venous Disease, Type II Diabetes, Type II Diabetes, Osteoarthritis, Osteomyelitis, Osteoarthritis, Osteomyelitis, Neuropathy Neuropathy Date Acquired: 11/30/2018 03/31/2018 N/A Weeks of Treatment: 2 2 N/A Wound Status: Open Open N/A Measurements L x W  x D 1.5x1.7x0.3 0.3x0.2x0.5 N/A (cm) Area (cm) : 2.003 0.047 N/A Volume (cm) : 0.601 0.024 N/A % Reduction in Area: 36.10% 50.00% N/A % Reduction in Volume: 61.60% 63.60% N/A Starting Position 1 12 (o'clock): Ending Position 1 12 (o'clock): Maximum Distance 1 (cm): 0.2 Undermining: No Yes N/A Classification: Grade 2 Grade 2 N/A Exudate Amount: Medium Medium N/A Exudate Type: Serosanguineous Serosanguineous N/A Melinda [name], Melinda [name] ([id number]) Exudate Color: red, brown red, brown N/A Wound Margin: Epibole Epibole N/A Granulation Amount: Large (67-100%) Small (1-33%) N/A Granulation Quality: Pale Pale N/A Necrotic Amount: Small (1-33%) Large (67-100%) N/A Exposed Structures: Fat Layer (Subcutaneous Fat Layer (Subcutaneous N/A Tissue) Exposed: Yes Tissue) Exposed: Yes Fascia: No Fascia: No Tendon: No Tendon: No Muscle: No Muscle: No Joint: No Joint: No Bone: No Bone: No Epithelialization: None None N/A Treatment Notes Electronic Signature(s) Signed: 03/13/2019 3:26:34 PM By: Army Melia Entered By: Army Melia on 03/13/2019 Melinda [name], Petrey.  ([id number]) -------------------------------------------------------------------------------- [place] Details Patient Name: Melinda [name] Date of Service: 03/13/2019 10:45 AM Medical Record Number: [id number] Patient Account Number: [id number] Date of Birth/Sex: [birth year] (63 y.o. F) Treating RN: Army Melia Primary Care Ulas Zuercher: [name] Other Clinician: Referring Cozetta Seif: [name] Treating Karey Stucki/Extender: [name], [name]: 2 Active Inactive Medication Nursing Diagnoses: Knowledge deficit related to medication safety: actual or potential Goals: Patient/caregiver will demonstrate understanding of all current medications Date Initiated: 02/26/2019 Target Resolution Date: 02/26/2019 Goal Status: Active Interventions: Assess for medication contraindications each visit where new medications are prescribed Notes: Necrotic Tissue Nursing Diagnoses: Impaired tissue integrity related to necrotic/devitalized tissue Goals: Necrotic/devitalized tissue will be minimized in the wound bed Date Initiated: 02/26/2019 Target Resolution Date: 03/06/2019 Goal Status: Active Interventions: Assess patient pain level pre-, during and post procedure and prior to discharge Treatment Activities: Apply topical anesthetic as ordered : 02/26/2019 Notes: Orientation to the Wound Care Program Nursing Diagnoses: Knowledge deficit related to the wound healing center program Goals: Patient/caregiver will verbalize understanding of the [facility] Date Initiated: 02/26/2019 Target Resolution Date: 02/26/2019 Goal Status: Active Melinda [name], Melinda [name] ([id number]) Interventions: Provide education on orientation to the wound center Notes: Pressure Nursing Diagnoses: Knowledge deficit related to causes and risk factors for pressure ulcer development Knowledge deficit related to management of pressures ulcers Goals: Patient will remain  free from development of additional pressure ulcers Date Initiated: 02/26/2019 Target Resolution Date: 02/26/2019 Goal Status: Active Patient will remain free of pressure ulcers Date Initiated: 02/26/2019 Target Resolution Date: 02/26/2019 Goal Status: Active Patient/caregiver will verbalize understanding of pressure ulcer management Date Initiated: 02/26/2019 Target Resolution Date: 02/26/2019 Goal Status: Active Interventions: Assess: immobility, friction, shearing, incontinence upon admission and as needed Notes: Wound/Skin Impairment Nursing Diagnoses: Impaired tissue integrity Goals: Ulcer/skin breakdown will have a volume reduction of 30% by week 4 Date Initiated: 02/26/2019 Target Resolution Date: 03/29/2019 Goal Status: Active Interventions: Provide education on ulcer and skin care Treatment Activities: Topical wound management initiated : 02/26/2019 Notes: Electronic Signature(s) Signed: 03/13/2019 3:26:34 PM By: Army Melia Entered By: Army Melia on 03/13/2019 Melinda [name], Melinda Seco. ([id number]) -------------------------------------------------------------------------------- Pain Assessment Details Patient Name: Melinda [name] Date of Service: 03/13/2019 10:45 AM Medical Record Number: [id number] Patient Account Number: [id number] Date of Birth/Sex: 1955-06-19 (63 y.o. F) Treating RN: [name] Primary Care Jeret Goyer: [name] Other Clinician: Referring Mariah Harn: [name] Treating Danaria Larsen/Extender: [name], [name]: 2 Active Problems Location of Pain Severity and Description of Pain Patient Has Paino No Site  Locations Pain Management and Medication Current Pain Management: Goals for Pain Management Patient denies pain at this time. Electronic Signature(s) Signed: 03/14/2019 3:15:39 PM By: [name], [name], RN, [name], [name] RN, [name] Entered By: [name], [name], RN, [name], [name] on 03/13/2019 10:55:46 Melinda [name]  ([id number]) -------------------------------------------------------------------------------- Patient/Caregiver Education Details Patient Name: Melinda [name] Date of Service: 03/13/2019 10:45 AM Medical Record Number: [id number] Patient Account Number: [id number] Date of Birth/Gender: Oct 08, 1955 (63 y.o. F) Treating RN: Army Melia Primary Care Physician: [name] Other Clinician: Referring Physician: [name] Treating Physician/Extender: Sharalyn Ink in Treatment: 2 Education Assessment Education Provided To: Patient Education Topics Provided Wound/Skin Impairment: Handouts: Caring for Your Ulcer Methods: Demonstration, Explain/Verbal Responses: State content correctly Electronic Signature(s) Signed: 03/13/2019 3:26:34 PM By: Army Melia Entered By: Army Melia on 03/13/2019 11:25:51 Melinda [name] ([id number]) -------------------------------------------------------------------------------- Wound Assessment Details Patient Name: Melinda [name] Date of Service: 03/13/2019 10:45 AM Medical Record Number: [id number] Patient Account Number: [id number] Date of Birth/Sex: 1955/10/13 (63 y.o. F) Treating RN: [name] Primary Care Mycah Formica: [name] Other Clinician: Referring Amalya Salmons: [name] Treating Advait Buice/Extender: [name], [name]: 2 Wound Status Wound Number: 1 Primary Diabetic Wound/Ulcer of the Lower Extremity Etiology: Wound Location: Left Toe Great - Plantar Wound Open Wounding Event: Gradually Appeared Status: Date Acquired: 11/30/2018 Comorbid Cataracts, Coronary Artery Disease, Weeks Of Treatment: 2 History: Hypertension, Myocardial Infarction, Peripheral Clustered Wound: No Venous Disease, Type II Diabetes, Osteoarthritis, Osteomyelitis, Neuropathy Photos Wound Measurements Length: (cm) 1.5 % Reduction Width: (cm) 1.7 % Reduction Depth: (cm) 0.3 Epitheliali Area: (cm) 2.003 Tunneling: Volume: (cm) 0.601  Underminin in Area: 36.1% in Volume: 61.6% zation: None No g: No Wound Description Classification: Grade 2 Foul Odor Wound Margin: Epibole Slough/Fib Exudate Amount: Medium Exudate Type: Serosanguineous Exudate Color: red, brown After Cleansing: No rino Yes Wound Bed Granulation Amount: Large (67-100%) Exposed Structure Granulation Quality: Pale Fascia Exposed: No Necrotic Amount: Small (1-33%) Fat Layer (Subcutaneous Tissue) Exposed: Yes Necrotic Quality: Adherent Slough Tendon Exposed: No Muscle Exposed: No Joint Exposed: No Bone Exposed: No Melinda [name], Melinda [name] Kitchen ([id number]) Treatment Notes Wound #1 (Left, Plantar Toe Great) Notes prisma, Foam, conform Electronic Signature(s) Signed: 03/14/2019 3:15:39 PM By: [name], [name], RN, [name], [name] RN, [name] Entered By: [name], [name], RN, [name], [name] on 03/13/2019 10:58:35 Melinda [name] ([id number]) -------------------------------------------------------------------------------- Wound Assessment Details Patient Name: Melinda [name] Date of Service: 03/13/2019 10:45 AM Medical Record Number: [id number] Patient Account Number: [id number] Date of Birth/Sex: August 13, 1955 (63 y.o. F) Treating RN: [name] Primary Care Hazeline Charnley: [name] Other Clinician: Referring Lillion Elbert: [name] Treating Sharyn Brilliant/Extender: [name], [name]: 2 Wound Status Wound Number: 2 Primary Diabetic Wound/Ulcer of the Lower Extremity Etiology: Wound Location: Right Toe Great - Plantar Wound Open Wounding Event: Gradually Appeared Status: Date Acquired: 03/31/2018 Comorbid Cataracts, Coronary Artery Disease, Weeks Of Treatment: 2 History: Hypertension, Myocardial Infarction, Peripheral Clustered Wound: No Venous Disease, Type II Diabetes, Osteoarthritis, Osteomyelitis, Neuropathy Photos Wound Measurements Length: (cm) 0.3 % Reduction Width: (cm) 0.2 % Reduction Depth: (cm) 0.5 Epithelializ Area: (cm) 0.047 Tunneling: Volume: (cm)  0.024 Undermining Starting Ending Po Maximum D in Area: 50% in Volume: 63.6% ation: None No : Yes Position (o'clock): 12 sition (o'clock): 12 istance: (cm) 0.2 Wound Description Classification: Grade 2 Foul Odor A Wound Margin: Epibole Slough/Fibr Exudate Amount: Medium Exudate Type: Serosanguineous Exudate Color: red, brown fter Cleansing: No ino Yes Wound Bed Granulation Amount: Small (1-33%) Exposed Structure Granulation Quality: Pale Fascia Exposed:  No Necrotic Amount: Large (67-100%) Fat Layer (Subcutaneous Tissue) Exposed: Yes Necrotic Quality: Adherent Slough Tendon Exposed: No Muscle Exposed: No Melinda [name], Melinda [name] ([id number]) Joint Exposed: No Bone Exposed: No Treatment Notes Wound #2 (Right, Plantar Toe Great) Notes prisma, Foam, conform Electronic Signature(s) Signed: 03/14/2019 3:15:39 PM By: [name], [name], RN, [name], [name] RN, [name] Entered By: [name], [name], RN, [name], [name] on 03/13/2019 10:57:57 Melinda [name] ([id number]) -------------------------------------------------------------------------------- Vitals Details Patient Name: Melinda [name] Date of Service: 03/13/2019 10:45 AM Medical Record Number: [id number] Patient Account Number: [id number] Date of Birth/Sex: 05/26/1955 (63 y.o. F) Treating RN: [name] Primary Care Ervine Witucki: [name] Other Clinician: Referring December Hedtke: [name] Treating Jimmie Rueter/Extender: [name], [name]: 2 Vital Signs Time Taken: 10:55 Temperature (F): 99.3 Height (in): 68 Pulse (bpm): 91 Weight (lbs): 300 Respiratory Rate (breaths/min): 16 Body Mass Index (BMI): 45.6 Blood Pressure (mmHg): 118/47 Reference Range: 80 - 120 mg / dl Electronic Signature(s) Signed: 03/14/2019 3:15:39 PM By: [name], [name], RN, [name], [name] RN, [name] Entered By: [name], [name], RN, [name], [name] on 03/13/2019 10:56:16

## 2019-03-19 ENCOUNTER — Ambulatory Visit: Payer: Medicare HMO | Admitting: Internal Medicine

## 2019-03-21 ENCOUNTER — Encounter: Payer: Medicare HMO | Attending: Surgery | Admitting: Dietician

## 2019-03-21 ENCOUNTER — Other Ambulatory Visit: Payer: Self-pay

## 2019-03-21 VITALS — Ht 68.5 in | Wt 304.2 lb

## 2019-03-21 DIAGNOSIS — I255 Ischemic cardiomyopathy: Secondary | ICD-10-CM | POA: Insufficient documentation

## 2019-03-21 DIAGNOSIS — I252 Old myocardial infarction: Secondary | ICD-10-CM | POA: Insufficient documentation

## 2019-03-21 DIAGNOSIS — Z794 Long term (current) use of insulin: Secondary | ICD-10-CM | POA: Insufficient documentation

## 2019-03-21 DIAGNOSIS — F17200 Nicotine dependence, unspecified, uncomplicated: Secondary | ICD-10-CM | POA: Insufficient documentation

## 2019-03-21 DIAGNOSIS — L97522 Non-pressure chronic ulcer of other part of left foot with fat layer exposed: Secondary | ICD-10-CM | POA: Diagnosis not present

## 2019-03-21 DIAGNOSIS — E1151 Type 2 diabetes mellitus with diabetic peripheral angiopathy without gangrene: Secondary | ICD-10-CM | POA: Diagnosis not present

## 2019-03-21 DIAGNOSIS — E1140 Type 2 diabetes mellitus with diabetic neuropathy, unspecified: Secondary | ICD-10-CM | POA: Diagnosis not present

## 2019-03-21 DIAGNOSIS — E1142 Type 2 diabetes mellitus with diabetic polyneuropathy: Secondary | ICD-10-CM | POA: Insufficient documentation

## 2019-03-21 DIAGNOSIS — E785 Hyperlipidemia, unspecified: Secondary | ICD-10-CM | POA: Insufficient documentation

## 2019-03-21 DIAGNOSIS — I10 Essential (primary) hypertension: Secondary | ICD-10-CM | POA: Insufficient documentation

## 2019-03-21 DIAGNOSIS — L97512 Non-pressure chronic ulcer of other part of right foot with fat layer exposed: Secondary | ICD-10-CM | POA: Insufficient documentation

## 2019-03-21 DIAGNOSIS — Z951 Presence of aortocoronary bypass graft: Secondary | ICD-10-CM | POA: Diagnosis not present

## 2019-03-21 DIAGNOSIS — I2510 Atherosclerotic heart disease of native coronary artery without angina pectoris: Secondary | ICD-10-CM | POA: Diagnosis not present

## 2019-03-21 DIAGNOSIS — Z6841 Body Mass Index (BMI) 40.0 and over, adult: Secondary | ICD-10-CM | POA: Diagnosis not present

## 2019-03-21 DIAGNOSIS — E11621 Type 2 diabetes mellitus with foot ulcer: Secondary | ICD-10-CM | POA: Diagnosis not present

## 2019-03-21 NOTE — Progress Notes (Signed)
Pre-Operative Nutrition Class:  Appt start time: 0900   End time:  1100.  Patient was seen on 03/21/19 for Pre-Operative Bariatric Surgery Education at [facility].   Surgery date: TBD Surgery type: RNY Gastric Bypass Start weight at [facility]: 311.6lbs Weight today: 304.2lbs BMI: 45.58  InBody  BODY COMP RESULTS unable to complete body comp measurement -- patient unable to remove medical boots due to toe ulcers.  Samples given per MNT protocol. Patient educated on appropriate usage: Celebrate Vitamins Multivitamin  Lot # [id number],  Exp: 06/2019; Lot# [id number], Exp: 08/2020  Quick melt B12  Lot# [id number], Exp: 05/2019  Iron 34m LUUV#[id number] Exp: 09/2020; 424mLot# [id number]: 09/2020  Celebrate Vitamins Calcium Citrate   Lot # [id number]: 06/2019; Lot# [id number]: 12/2019; Lot# [id number] 09/2019; Lot# [id number], exp: 10/2019; Lot# [id number]: 11/2019; Lot# 0006, exp: 10/2019; Lot# 0002, exp: 10/2019; Lot# [id number], exp: 11/2-21  UnRenee Painrotein Powder   Lot # [id number]: 09/2019; Lot# [id number]: 09/2019; Lot# [id number]: 09/2019  UnDanvillerotein Shake   Lot# [id number]: 04/20/19, or LoHCW#[id number]: 04/21/19  The following the learning objectives were met by the patient during this course:  Identify Pre-Op Dietary Goals and will begin 2 weeks pre-operatively  Identify appropriate sources of fluids and proteins   State protein recommendations and appropriate sources pre and post-operatively  Identify Post-Operative Dietary Goals and will follow for 2 weeks post-operatively  Identify appropriate multivitamin and calcium sources  Describe the need for physical activity post-operatively and will follow MD recommendations  State when to call healthcare provider regarding medication questions or post-operative complications  Handouts given during class include:  Pre-Op Bariatric Surgery Diet Handout  Protein Shake Handout  Post-Op  Bariatric Surgery Nutrition Handout  [facility] Program Information Flyer  Support Group Information Flyer  [facility] Bariatric Supplements Price List  Follow-Up Plan: Patient will follow-up at [facility] about 2 weeks post operatively for diet advancement per MD.

## 2019-03-25 ENCOUNTER — Other Ambulatory Visit: Payer: Self-pay

## 2019-03-25 ENCOUNTER — Encounter: Payer: Medicare HMO | Admitting: Physician Assistant

## 2019-03-25 DIAGNOSIS — E11621 Type 2 diabetes mellitus with foot ulcer: Secondary | ICD-10-CM | POA: Diagnosis not present

## 2019-03-26 NOTE — Progress Notes (Signed)
AYSLINN, GROSHEK ([id number]) Visit Report for 03/25/2019 Chief Complaint Document Details Patient Name: Melinda [name], Melinda [name] Date of Service: 03/25/2019 3:00 PM Medical Record Number: [id number] Patient Account Number: [id number] Date of Birth/Sex: [birth year] (63 y.o. F) Treating RN: [name] Primary Care Provider: [name] Other Clinician: Referring Provider: [name] Treating Provider/Extender: [name], Telesia Ates Weeks in Treatment: 3 Information Obtained from: Patient Chief Complaint 02/26/2019; patient is here for review of wounds on her bilateral plantar feet Electronic Signature(s) Signed: 03/25/2019 6:12:02 PM By: [name] [name] Entered By: [name] on 03/25/2019 14:47:00 Melinda [name] ([id number]) -------------------------------------------------------------------------------- Debridement Details Patient Name: Melinda [name] Date of Service: 03/25/2019 3:00 PM Medical Record Number: [id number] Patient Account Number: [id number] Date of Birth/Sex: 1955-07-27 (63 y.o. F) Treating RN: [name] Primary Care Provider: [name] Other Clinician: Referring Provider: [name] Treating Provider/Extender: [name], Rahima Fleishman Weeks in Treatment: 3 Debridement Performed for Wound #2 Right,Plantar Toe Great Assessment: Performed By: Physician [name], Carliss Porcaro E., [name] Debridement Type: Debridement Severity of Tissue Pre Fat layer exposed Debridement: Level of Consciousness (Pre- Awake and Alert procedure): Pre-procedure Verification/Time Yes - 15:42 Out Taken: Start Time: 15:42 Pain Control: Lidocaine 4% Topical Solution Total Area Debrided (L x W): 0.4 (cm) x 0.3 (cm) = 0.12 (cm) Tissue and other material Viable, Non-Viable, Callus, Slough, Subcutaneous, Slough debrided: Level: Skin/Subcutaneous Tissue Debridement Description: Excisional Instrument: Curette Bleeding: Minimum Hemostasis Achieved: Pressure End Time: 15:45 Procedural Pain:  0 Post Procedural Pain: 0 Response to Treatment: Procedure was tolerated well Level of Consciousness Awake and Alert (Post-procedure): Post Debridement Measurements of Total Wound Length: (cm) 0.4 Width: (cm) 0.3 Depth: (cm) 0.3 Volume: (cm) 0.028 Character of Wound/Ulcer Post Debridement: Improved Severity of Tissue Post Debridement: Fat layer exposed Post Procedure Diagnosis Same as Pre-procedure Electronic Signature(s) Signed: 03/25/2019 5:06:49 PM By: [name] Signed: 03/25/2019 6:12:02 PM By: [name] [name] Entered By: [name] on 03/25/2019 15:45:33 Melinda [name] ([id number]) -------------------------------------------------------------------------------- HPI Details Patient Name: Melinda [name] Date of Service: 03/25/2019 3:00 PM Medical Record Number: [id number] Patient Account Number: [id number] Date of Birth/Sex: [birth year] (63 y.o. 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 She has been followed by Dr. Sherren Mocha cline of podiatry for problems related to her feet dating back to the early part of 2019 as I can review in [place] link. 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 Subsequently she was seen by Dr. Lucky Cowboy of vascular surgery and had stents x2 placed in her left SFA as well as left SFA angioplasties on 05/20/2017. She was noted to have a wound on her left foot in October 2019. In August 2020 on 8/24 she underwent a right anterior tibial artery angioplasty a right tibioperoneal trunk angioplasty and a right SFA angioplasty. The patient states that she developed a left great toe wound in August which is at the  base of her previous partial amputation in this area. She tells [name] that she has had a right great toe wound since December 2019 and she has been using Santyl to both of these areas that she received from a fellow parishioner at her church. By enlarge she has been using Neosporin to these areas and not offloading them  specifically Arterial studies on 9/22 showed an ABI on the right of 0.71 with triphasic waveforms on the left at 0.88 with triphasic and biphasic waveforms. TBI's on the right and 0.44 and on the left at 1.05. 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 11//20; x-rays I did last week were both negative for osteomyelitis. She has a fairly large wound at the base of her left first toe and a small punched out area on the right first toe. We use silver alginate last week 03/13/2019 upon evaluation today patient appears to be doing okay with regard to her wounds at this point. She does have some callus buildup noted upon evaluation at this point. Fortunately there is no evidence of active infection which is also good news. I am going to have to perform some debridement to clear away some of the necrotic tissue today. 03/25/2019 on evaluation today patient appears to be doing well with regard to her foot ulcers. She has been tolerating the dressing changes without complication. Fortunately there is no signs of active infection at this time. Her left foot ulcer actually seems to be doing excellent no debridement even necessary today I am good have to perform some debridement on the right great toe. Electronic Signature(s) Signed: 03/25/2019 6:12:02 PM By: [name] [name] Entered By: [name] on 03/25/2019 15:47:27 Melinda [name]  ([id number]) -------------------------------------------------------------------------------- Physical Exam Details Patient Name: Melinda [name] Date of Service: 03/25/2019 3:00 PM Medical Record Number: [id number] Patient Account Number: [id number] Date of Birth/Sex: [birth year] (63 y.o. F) Treating RN: [name] Primary Care Provider: [name] Other Clinician: Referring Provider: [name] Treating Provider/Extender: [name], Jaylise Peek Weeks in Treatment: 3 Constitutional Well-nourished and well-hydrated in no acute distress. Respiratory normal breathing without difficulty. clear to auscultation bilaterally. Cardiovascular regular rate and rhythm with normal S1, S2. Psychiatric this patient is able to make decisions and demonstrates good insight into disease process. Alert and Oriented x 3. pleasant and cooperative. Notes Patient's wound bed currently showed signs of good granulation on the left foot. She also seemed to have fairly good granulation noted underneath some slough and callus buildup on the right great toe but again I did have to perform some sharp debridement to clear this away in order to get down to the good granulation tissue so that she can continue to apply the dressing to the area. She tolerated this debridement today without complication and post debridement the wound bed appears to be doing much better. Electronic Signature(s) Signed: 03/25/2019 6:12:02 PM By: [name] [name] Entered By: [name] on 03/25/2019 15:48:09 Melinda [name] ([id number]) -------------------------------------------------------------------------------- Physician Orders Details Patient Name: Melinda [name] Date of Service: 03/25/2019 3:00 PM Medical Record Number: [id number] Patient Account Number: [id number] Date of Birth/Sex: May 24, 1955 (63 y.o. F) Treating RN: [name] Primary Care Provider: [name] Other Clinician: Referring Provider: [name] Treating Provider/Extender: [name], Jlee Harkless Weeks in Treatment: 3 Verbal / Phone Orders: No Diagnosis Coding ICD-10 Coding Code Description E11.621 Type 2 diabetes mellitus with foot ulcer L97.512 Non-pressure chronic ulcer of other part of right foot with fat layer exposed 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sed E11.51 Type 2 diabetes mellitus with diabetic peripheral angiopathy without gangrene E11.42 Type 2 diabetes mellitus with diabetic polyneuropathy Wound Cleansing Wound #1 Left,Plantar Toe  Great o Cleanse wound with mild soap and water o No tub bath. Wound #2 Right,Plantar Toe Great o Cleanse wound with mild soap and water o No tub bath. Anesthetic (add to Medication List) Wound #1 Left,Plantar Toe Great o Topical Lidocaine 4% cream applied to wound bed prior to debridement (In Clinic Only). Wound #2 Right,Plantar Toe Great o Topical Lidocaine 4% cream applied to wound bed prior to debridement (In Clinic Only). Primary Wound Dressing Wound #1 Left,Plantar Toe Great o Silver Collagen Wound #2 Right,Plantar Toe Great o Silver Collagen Secondary Dressing Wound #1 Left,Plantar Toe Great o Gauze and Kerlix/Conform o Foam Wound #2 Right,Plantar Toe Great o Gauze and Kerlix/Conform o Foam Dressing Change Frequency Wound #1 Left,Plantar Toe Shinae Tegtmeier, Celesta E. ([id number]) o Change dressing every other day. Wound #2 Right,Plantar Toe Great o Change dressing every other day. Follow-up Appointments o Return Appointment in 1 week. Edema Control Wound #1 Left,Plantar Toe Great o Elevate legs to the level of the heart and pump ankles as often as possible Wound #2 Right,Plantar Toe Great o Elevate legs to the level of the heart and pump ankles as often as possible Off-Loading Wound #1 Left,Plantar Toe Great o Open toe surgical shoe with peg assist. - Bilateral Wound #2 Right,Plantar  Toe Great o Open toe surgical shoe with peg assist. - Bilateral Additional Orders / Instructions Wound #1 Left,Plantar Toe Great o Stop Smoking o Increase protein intake. Wound #2 Right,Plantar Toe Great o Stop Smoking o Increase protein intake. Electronic Signature(s) Signed: 03/25/2019 5:06:49 PM By: [name] Signed: 03/25/2019 6:12:02 PM By: [name] [name] Entered By: [name] on 03/25/2019 15:44:58 Melinda [name] ([id number]) -------------------------------------------------------------------------------- Problem List Details Patient Name: RASHAUNDA, ROEVER. Date of Service: 03/25/2019 3:00 PM Medical Record Number: [id number] Patient Account Number: [id number] Date of Birth/Sex: 09/19/1955 (63 y.o. F) Treating RN: [name] Primary Care Provider: [name] Other Clinician: Referring Provider: [name] Treating Provider/Extender: [name], Ladine Kiper Weeks in Treatment: 3 Active Problems ICD-10 Evaluated Encounter Code Description Active Date Today Diagnosis E11.621 Type 2 diabetes mellitus with foot ulcer 02/26/2019 No Yes L97.512 Non-pressure chronic ulcer of other part of right foot with fat 02/26/2019 No Yes layer exposed L97.522 Non-pressure chronic ulcer of other part of left foot with fat 02/26/2019 No Yes layer exposed E11.51 Type 2 diabetes mellitus with diabetic peripheral angiopathy 02/26/2019 No Yes without gangrene E11.42 Type 2 diabetes mellitus with diabetic polyneuropathy 02/26/2019 No Yes Inactive Problems Resolved Problems Electronic Signature(s) Signed: 03/25/2019 6:12:02 PM By: [name] [name] Entered By: [name] on 03/25/2019 14:46:55 Melinda [name] ([id number]) -------------------------------------------------------------------------------- Progress Note Details Patient Name: Melinda [name] Date of Service: 03/25/2019 3:00 PM Medical Record Number: [id number] Patient Account Number:  [id number] Date of Birth/Sex: Apr 19, 1956 (63 y.o. 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 She has been followed by Dr. Sherren Mocha cline of podiatry for problems related to her feet dating back to the early part of 2019 as I can review in [place] link. 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 Subsequently she was seen by Dr. Lucky Cowboy of vascular  surgery and had stents x2 placed in her left SFA as well as left SFA angioplasties on 05/20/2017. She was noted to have a wound on her left foot in October 2019. In August 2020 on 8/24 she underwent a right anterior tibial artery angioplasty a right tibioperoneal trunk angioplasty and a right SFA angioplasty. The patient states that she developed a left great toe wound in August which is at the base of her previous partial amputation in this area. She tells [name] that she has had a right great toe wound since December 2019 and she has been using Santyl to both of these areas that she received from a fellow parishioner at her church. By enlarge she has been using Neosporin to these areas and not offloading them specifically Arterial studies on 9/22 showed an ABI on the right of 0.71 with triphasic waveforms on the left at 0.88 with triphasic and biphasic waveforms. TBI's on the right and 0.44 and on the left at 1.05. Past medical history includes hypertension, type 2 diabetes with peripheral neuropathy, known PAD, coronary artery disease status post CABG x4 in 2016 obesity, tobacco abuse, bilateral  third toe amputations. 11//20; x-rays I did last week were both negative for osteomyelitis. She has a fairly large wound at the base of her left first toe and a small punched out area on the right first toe. We use silver alginate last week 03/13/2019 upon evaluation today patient appears to be doing okay with regard to her wounds at this point. She does have some callus buildup noted upon evaluation at this point. Fortunately there is no evidence of active infection which is also good news. I am going to have to perform some debridement to clear away some of the necrotic tissue today. 03/25/2019 on evaluation today patient appears to be doing well with regard to her foot ulcers. She has been tolerating the dressing changes without complication. Fortunately there is no signs of active infection at this time. Her left foot ulcer actually seems to be doing excellent no debridement even necessary today I am good have to perform some debridement on the right great toe. Patient History Information obtained from Patient. Family History Diabetes - Mother,Father, Heart Disease - Mother,Father, Hypertension - Mother, No family history of Cancer, Hereditary Spherocytosis, Kidney Disease, Lung Disease, Seizures, Stroke, Thyroid Problems, Tuberculosis. MARYBELLE, ALLBRIGHT ([id number]) Social History Current every day smoker - 49 years, Alcohol Use - Never, Drug Use - No History, Caffeine Use - Daily. Medical History Eyes Patient has history of Cataracts Denies history of Glaucoma Ear/Nose/Mouth/Throat Denies history of Chronic sinus problems/congestion, Middle ear problems Hematologic/Lymphatic Denies history of Anemia, Hemophilia, Human Immunodeficiency Virus, Lymphedema, Sickle Cell Disease Respiratory Denies history of Aspiration, Asthma, Chronic Obstructive Pulmonary Disease (COPD), Pneumothorax, Sleep Apnea, Tuberculosis Cardiovascular Patient has history of Coronary Artery Disease, Hypertension,  Myocardial Infarction, Peripheral Venous Disease Denies history of Angina, Arrhythmia, Congestive Heart Failure, Deep Vein Thrombosis, Hypotension, Phlebitis, Vasculitis Gastrointestinal Denies history of Cirrhosis , Colitis, Crohn s, Hepatitis A, Hepatitis B, Hepatitis C Endocrine Patient has history of Type II Diabetes - IDDM Denies history of Type I Diabetes Genitourinary Denies history of End Stage Renal Disease Immunological Denies history of Lupus Erythematosus, Raynaud s, Scleroderma Integumentary (Skin) Denies history of History of Burn, History of pressure wounds Musculoskeletal Patient has history of Osteoarthritis - knee bilateral, Osteomyelitis Neurologic Patient has history of Neuropathy - peripheral Denies history of Dementia, Quadriplegia, Paraplegia, Seizure Disorder Medical And Surgical History Notes Cardiovascular ischemic cardiomyopathy, hyperlipidemia  Review of Systems (ROS) Constitutional Symptoms (General Health) Denies complaints or symptoms of Fatigue, Fever, Chills, Marked Weight Change. Respiratory Denies complaints or symptoms of Chronic or frequent coughs, Shortness of Breath. Cardiovascular Denies complaints or symptoms of Chest pain, LE edema. Psychiatric Denies complaints or symptoms of Anxiety, Claustrophobia. Objective Constitutional JORDANNA, TAGAWA E. ([id number]) Well-nourished and well-hydrated in no acute distress. Vitals Time Taken: 3:00 PM, Height: 68 in, Weight: 300 lbs, BMI: 45.6, Temperature: 98.9 F, Pulse: 88 bpm, Respiratory Rate: 18 breaths/min, Blood Pressure: 153/69 mmHg. Respiratory normal breathing without difficulty. clear to auscultation bilaterally. Cardiovascular regular rate and rhythm with normal S1, S2. Psychiatric this patient is able to make decisions and demonstrates good insight into disease process. Alert and Oriented x 3. pleasant and cooperative. General Notes: Patient's wound bed currently showed signs of good  granulation on the left foot. She also seemed to have fairly good granulation noted underneath some slough and callus buildup on the right great toe but again I did have to perform some sharp debridement to clear this away in order to get down to the good granulation tissue so that she can continue to apply the dressing to the area. She tolerated this debridement today without complication and post debridement the wound bed appears to be doing much better. Integumentary (Hair, Skin) Wound #1 status is Open. Original cause of wound was Gradually Appeared. The wound is located on the [employer]. The wound measures 1.4cm length x 1.5cm width x 0.3cm depth; 1.649cm^2 area and 0.495cm^3 volume. There is Fat Layer (Subcutaneous Tissue) Exposed exposed. There is no tunneling or undermining noted. There is a medium amount of serosanguineous drainage noted. The wound margin is epibole. There is large (67-100%) pale granulation within the wound bed. There is a small (1-33%) amount of necrotic tissue within the wound bed including Adherent Slough. Wound #2 status is Open. Original cause of wound was Gradually Appeared. The wound is located on the [employer]. The wound measures 0.4cm length x 0.3cm width x 0.4cm depth; 0.094cm^2 area and 0.038cm^3 volume. There is Fat Layer (Subcutaneous Tissue) Exposed exposed. There is no tunneling or undermining noted. There is a medium amount of serosanguineous drainage noted. The wound margin is epibole. There is small (1-33%) pale granulation within the wound bed. There is a large (67-100%) amount of necrotic tissue within the wound bed including Adherent Slough. Assessment Active Problems ICD-10 Type 2 diabetes mellitus with foot ulcer Non-pressure chronic ulcer of other part of right foot with fat layer exposed Non-pressure chronic ulcer of other part of left foot with fat layer exposed Type 2 diabetes mellitus with diabetic peripheral  angiopathy without gangrene Type 2 diabetes mellitus with diabetic polyneuropathy Procedures Wound #2 MIKU, GREVING E. ([id number]) Pre-procedure diagnosis of Wound #2 is a Diabetic Wound/Ulcer of the Lower Extremity located on the Right,Plantar Toe Great .Severity of Tissue Pre Debridement is: Fat layer exposed. There was a Excisional Skin/Subcutaneous Tissue Debridement with a total area of 0.12 sq cm performed by [name], Nakyla Bracco E., [name]. With the following instrument(s): Curette to remove Viable and Non-Viable tissue/material. Material removed includes Callus, Subcutaneous Tissue, and Slough after achieving pain control using Lidocaine 4% Topical Solution. No specimens were taken. A time out was conducted at 15:42, prior to the start of the procedure. A Minimum amount of bleeding was controlled with Pressure. The procedure was tolerated well with a pain level of 0 throughout and a pain level of 0 following the procedure. Post Debridement Measurements:  0.4cm length x 0.3cm width x 0.3cm depth; 0.028cm^3 volume. Character of Wound/Ulcer Post Debridement is improved. Severity of Tissue Post Debridement is: Fat layer exposed. Post procedure Diagnosis Wound #2: Same as Pre-Procedure Plan Wound Cleansing: Wound #1 Left,Plantar Toe Great: Cleanse wound with mild soap and water No tub bath. Wound #2 Right,Plantar Toe Great: Cleanse wound with mild soap and water No tub bath. Anesthetic (add to Medication List): Wound #1 Left,Plantar Toe Great: Topical Lidocaine 4% cream applied to wound bed prior to debridement (In Clinic Only). Wound #2 Right,Plantar Toe Great: Topical Lidocaine 4% cream applied to wound bed prior to debridement (In Clinic Only). Primary Wound Dressing: Wound #1 Left,Plantar Toe Great: Silver Collagen Wound #2 Right,Plantar Toe Great: Silver Collagen Secondary Dressing: Wound #1 Left,Plantar Toe Great: Gauze and Kerlix/Conform Foam Wound #2 Right,Plantar Toe  Great: Gauze and Kerlix/Conform Foam Dressing Change Frequency: Wound #1 Left,Plantar Toe Great: Change dressing every other day. Wound #2 Right,Plantar Toe Great: Change dressing every other day. Follow-up Appointments: Return Appointment in 1 week. Edema Control: Wound #1 Left,Plantar Toe Great: Elevate legs to the level of the heart and pump ankles as often as possible Wound #2 Right,Plantar Toe Great: Elevate legs to the level of the heart and pump ankles as often as possible Off-Loading: Wound #1 Left,Plantar Toe Great: Open toe surgical shoe with peg assist. - Bilateral Wound #2 Right,Plantar Toe Great: Open toe surgical shoe with peg assist. - Bilateral Wyble, Treva E. ([id number]) Additional Orders / Instructions: Wound #1 Left,Plantar Toe Great: Stop Smoking Increase protein intake. Wound #2 Right,Plantar Toe Great: Stop Smoking Increase protein intake. 1 my suggestion at this point is good to be that we go ahead and continue with the silver collagen dressing I feel like that is doing a great job for her and patient is in agreement with plan. 2. I would recommend as well she continue with offloading utilizing the postop shoes with peg assist as I feel like that is also doing well for her. 3. I recommend as well that she continue to work on smoking cessation as well as increased protein intake both which can help with healing as well. We will see patient back for reevaluation in 1 week here in the clinic. If anything worsens or changes patient will contact our office for additional recommendations. Electronic Signature(s) Signed: 03/25/2019 6:12:02 PM By: [name] [name] Entered By: [name] on 03/25/2019 15:48:37 Melinda [name] ([id number]) -------------------------------------------------------------------------------- ROS/PFSH Details Patient Name: Melinda [name] Date of Service: 03/25/2019 3:00 PM Medical Record Number: [id number] Patient Account  Number: [id number] Date of Birth/Sex: 1955-12-23 (63 y.o. F) Treating RN: [name] Primary Care Provider: [name] Other Clinician: Referring Provider: [name] Treating Provider/Extender: [name], Geni Skorupski Weeks in Treatment: 3 Information Obtained From Patient Constitutional Symptoms (General Health) Complaints and Symptoms: Negative for: Fatigue; Fever; Chills; Marked Weight Change Respiratory Complaints and Symptoms: Negative for: Chronic or frequent coughs; Shortness of Breath Medical History: Negative for: Aspiration; Asthma; Chronic Obstructive Pulmonary Disease (COPD); Pneumothorax; Sleep Apnea; Tuberculosis Cardiovascular Complaints and Symptoms: Negative for: Chest pain; LE edema Medical History: Positive for: Coronary Artery Disease; Hypertension; Myocardial Infarction; Peripheral Venous Disease Negative for: Angina; Arrhythmia; Congestive Heart Failure; Deep Vein Thrombosis; Hypotension; Phlebitis; Vasculitis Past Medical History Notes: ischemic cardiomyopathy, hyperlipidemia Psychiatric Complaints and Symptoms: Negative for: Anxiety; Claustrophobia Eyes Medical History: Positive for: Cataracts Negative for: Glaucoma Ear/Nose/Mouth/Throat Medical History: Negative for: Chronic sinus problems/congestion; Middle ear problems Hematologic/Lymphatic Medical History: Negative for: Anemia; Hemophilia;  Human Immunodeficiency Virus; Lymphedema; Sickle Cell Disease Gastrointestinal OZELIA, FRIESS ([id number]) Medical History: Negative for: Cirrhosis ; Colitis; Crohnos; Hepatitis A; Hepatitis B; Hepatitis C Endocrine Medical History: Positive for: Type II Diabetes - IDDM Negative for: Type I Diabetes Time with diabetes: 72 years Treated with: Insulin Blood sugar tested every day: Yes Tested : Blood sugar testing results: Breakfast: 82 Genitourinary Medical History: Negative for: End Stage Renal Disease Immunological Medical History: Negative for:  Lupus Erythematosus; Raynaudos; Scleroderma Integumentary (Skin) Medical History: Negative for: History of Burn; History of pressure wounds Musculoskeletal Medical History: Positive for: Osteoarthritis - knee bilateral; Osteomyelitis Neurologic Medical History: Positive for: Neuropathy - peripheral Negative for: Dementia; Quadriplegia; Paraplegia; Seizure Disorder HBO Extended History Items Eyes: Cataracts Immunizations Pneumococcal Vaccine: Received Pneumococcal Vaccination: No Implantable Devices None Family and Social History Cancer: No; Diabetes: Yes - Mother,Father; Heart Disease: Yes - Mother,Father; Hereditary Spherocytosis: No; Hypertension: Yes - Mother; Kidney Disease: No; Lung Disease: No; Seizures: No; Stroke: No; Thyroid Problems: No; Tuberculosis: No; Current every day smoker - 49 years; Alcohol Use: Never; Drug Use: No History; Caffeine Use: Daily; Financial Concerns: No; Food, Clothing or Shelter Needs: No; Support System Lacking: No; Transportation Concerns: No Physician Affirmation ANVIKA, NORRICK ([id number]) I have reviewed and agree with the above information. Electronic Signature(s) Signed: 03/25/2019 5:06:49 PM By: [name] Signed: 03/25/2019 6:12:02 PM By: [name] [name] Entered By: [name] on 03/25/2019 15:47:54 Melinda [name] ([id number]) -------------------------------------------------------------------------------- SuperBill Details Patient Name: Melinda [name] Date of Service: 03/25/2019 Medical Record Number: [id number] Patient Account Number: [id number] Date of Birth/Sex: 12-05-1955 (63 y.o. 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 E11.51 Type 2 diabetes mellitus with diabetic peripheral angiopathy without gangrene E11.42 Type 2 diabetes mellitus with diabetic polyneuropathy Facility Procedures CPT4 Code: [id number] Description: [place] - DEB SUBQ TISSUE 20 SQ CM/< ICD-10 Diagnosis Description L97.512 Non-pressure chronic ulcer of other part of right foot with fat Modifier: layer exposed Quantity: 1 Physician Procedures CPT4 Code: [id number] Description: 11042 - WC PHYS SUBQ TISS 20 SQ CM ICD-10 Diagnosis Description L97.512 Non-pressure chronic ulcer of other part of right foot with fat Modifier: layer exposed Quantity: 1 Electronic Signature(s) Signed: 03/25/2019 6:12:02 PM By: [name] [name] Entered By: [name] on 03/25/2019 15:48:50

## 2019-03-26 NOTE — Progress Notes (Signed)
Melinda [name] ([id number]) Visit Report for 03/25/2019 Arrival Information Details Patient Name: Melinda [name], Melinda [name] Date of Service: 03/25/2019 3:00 PM Medical Record Number: [id number] Patient Account Number: [id number] Date of Birth/Sex: Jul 23, 1955 (63 y.o. F) Treating RN: Harold Barban Primary Care Drexel Ivey: [name] Other Clinician: Referring Brettney Ficken: [name] Treating Tien Aispuro/Extender: [name], [name]: 3 Visit Information History Since Last Visit Added or deleted any medications: No Patient Arrived: Ambulatory Any new allergies or adverse reactions: No Arrival Time: 14:59 Had a fall or experienced change in No Accompanied By: self activities of daily living that may affect Transfer Assistance: None risk of falls: Patient Identification Verified: Yes Signs or symptoms of abuse/neglect since last visito No Secondary Verification Process Yes Hospitalized since last visit: No Completed: Has Dressing in Place as Prescribed: Yes Patient Has Alerts: Yes Has Footwear/Offloading in Place as Prescribed: Yes Patient Alerts: Patient on Blood Left: Wedge Shoe Thinner Plavix, Aspirin 81mg  Right: Wedge Shoe ABI's L .88/R .71 Pain Present Now: No Electronic Signature(s) Signed: 03/25/2019 4:41:34 PM By: Harold Barban Entered By: Harold Barban on 03/25/2019 14:59:54 Melinda [name] ([id number]) -------------------------------------------------------------------------------- Encounter Discharge Information Details Patient Name: Melinda [name] Date of Service: 03/25/2019 3:00 PM Medical Record Number: [id number] Patient Account Number: [id number] Date of Birth/Sex: 06-Nov-1955 (63 y.o. F) Treating RN: [name] Primary Care Manvi Guilliams: [name] Other Clinician: Referring Alaisa Moffitt: [name] Treating Caress Reffitt/Extender: [name], [name]: 3 Encounter Discharge Information Items Post Procedure Vitals Discharge Condition:  Stable Temperature (F): 98.9 Ambulatory Status: Ambulatory Pulse (bpm): 88 Discharge Destination: Home Respiratory Rate (breaths/min): 16 Transportation: Private Auto Blood Pressure (mmHg): 153/69 Accompanied By: self Schedule Follow-up Appointment: Yes Clinical Summary of Care: Electronic Signature(s) Signed: 03/25/2019 5:06:49 PM By: [name] Entered By: [name] on 03/25/2019 15:47:06 Melinda [name] ([id number]) -------------------------------------------------------------------------------- Lower Extremity Assessment Details Patient Name: Melinda [name] Date of Service: 03/25/2019 3:00 PM Medical Record Number: [id number] Patient Account Number: [id number] Date of Birth/Sex: [birth year] (64 y.o. F) Treating RN: Harold Barban Primary Care Kace Hartje: [name] Other Clinician: Referring Haja Crego: [name] Treating Zlaty Alexa/Extender: [name], [name]: 3 Edema Assessment Assessed: [Left: No] [Right: No] Edema: [Left: No] [Right: No] Vascular Assessment Pulses: Dorsalis Pedis Palpable: [Left:Yes] [Right:Yes] Electronic Signature(s) Signed: 03/25/2019 4:41:34 PM By: Harold Barban Entered By: Harold Barban on 03/25/2019 15:09:01 Melinda [name] ([id number]) -------------------------------------------------------------------------------- Multi Wound Chart Details Patient Name: Melinda [name] Date of Service: 03/25/2019 3:00 PM Medical Record Number: [id number] Patient Account Number: [id number] Date of Birth/Sex: 1955-09-15 (63 y.o. F) Treating RN: [name] Primary Care Tyson Parkison: [name] Other Clinician: Referring Mitzy Naron: [name] Treating Saharah Sherrow/Extender: [name], [name]: 3 Vital Signs Height(in): 68 Pulse(bpm): 23 Weight(lbs): 300 Blood Pressure(mmHg): 153/69 Body Mass Index(BMI): 46 Temperature(F): 98.9 Respiratory Rate 18 (breaths/min): Photos: [N/A:N/A] Wound Location: Left  Toe Great - Plantar Right Toe Great - Plantar N/A Wounding Event: Gradually Appeared Gradually Appeared N/A Primary Etiology: Diabetic Wound/Ulcer of the Diabetic Wound/Ulcer of the N/A Lower Extremity Lower Extremity Comorbid History: Cataracts, Coronary Artery Cataracts, Coronary Artery N/A Disease, Hypertension, Disease, Hypertension, Myocardial Infarction, Myocardial Infarction, Peripheral Venous Disease, Peripheral Venous Disease, Type II Diabetes, Type II Diabetes, Osteoarthritis, Osteomyelitis, Osteoarthritis, Osteomyelitis, Neuropathy Neuropathy Date Acquired: 11/30/2018 03/31/2018 N/A Weeks of Treatment: 3 3 N/A Wound Status: Open Open N/A Measurements L x W x D 1.4x1.5x0.3 0.4x0.3x0.4 N/A (cm) Area (cm) : 1.649 0.094 N/A Volume (cm) : 0.495 0.038 N/A % Reduction in Area:  47.40% 0.00% N/A % Reduction in Volume: 68.40% 42.40% N/A Classification: Grade 2 Grade 2 N/A Exudate Amount: Medium Medium N/A Exudate Type: Serosanguineous Serosanguineous N/A Exudate Color: red, brown red, brown N/A Wound Margin: Epibole Epibole N/A Granulation Amount: Large (67-100%) Small (1-33%) N/A Granulation Quality: Pale Pale N/A Necrotic Amount: Small (1-33%) Large (67-100%) N/A Exposed Structures: N/A ALBERT, NESSETH ([id number]) Fat Layer (Subcutaneous Fat Layer (Subcutaneous Tissue) Exposed: Yes Tissue) Exposed: Yes Fascia: No Fascia: No Tendon: No Tendon: No Muscle: No Muscle: No Joint: No Joint: No Bone: No Bone: No Epithelialization: None None N/A Treatment Notes Electronic Signature(s) Signed: 03/25/2019 5:06:49 PM By: [name] Entered By: [name] on 03/25/2019 15:39:27 Melinda [name] ([id number]) -------------------------------------------------------------------------------- [place] Details Patient Name: Melinda [name] Date of Service: 03/25/2019 3:00 PM Medical Record Number: [id number] Patient Account Number: [id number] Date of  Birth/Sex: 1955-05-26 (63 y.o. F) Treating RN: [name] Primary Care Racheal Mathurin: [name] Other Clinician: Referring Zorianna Taliaferro: [name] Treating Reshaun Briseno/Extender: [name], [name]: 3 Active Inactive Medication Nursing Diagnoses: Knowledge deficit related to medication safety: actual or potential Goals: Patient/caregiver will demonstrate understanding of all current medications Date Initiated: 02/26/2019 Target Resolution Date: 02/26/2019 Goal Status: Active Interventions: Assess for medication contraindications each visit where new medications are prescribed Notes: Necrotic Tissue Nursing Diagnoses: Impaired tissue integrity related to necrotic/devitalized tissue Goals: Necrotic/devitalized tissue will be minimized in the wound bed Date Initiated: 02/26/2019 Target Resolution Date: 03/06/2019 Goal Status: Active Interventions: Assess patient pain level pre-, during and post procedure and prior to discharge Treatment Activities: Apply topical anesthetic as ordered : 02/26/2019 Notes: Orientation to the Wound Care Program Nursing Diagnoses: Knowledge deficit related to the wound healing center program Goals: Patient/caregiver will verbalize understanding of the [place] Program Date Initiated: 02/26/2019 Target Resolution Date: 02/26/2019 Goal Status: Active KALLAN, HERRIMAN ([id number]) Interventions: Provide education on orientation to the wound center Notes: Pressure Nursing Diagnoses: Knowledge deficit related to causes and risk factors for pressure ulcer development Knowledge deficit related to management of pressures ulcers Goals: Patient will remain free from development of additional pressure ulcers Date Initiated: 02/26/2019 Target Resolution Date: 02/26/2019 Goal Status: Active Patient will remain free of pressure ulcers Date Initiated: 02/26/2019 Target Resolution Date: 02/26/2019 Goal Status:  Active Patient/caregiver will verbalize understanding of pressure ulcer management Date Initiated: 02/26/2019 Target Resolution Date: 02/26/2019 Goal Status: Active Interventions: Assess: immobility, friction, shearing, incontinence upon admission and as needed Notes: Wound/Skin Impairment Nursing Diagnoses: Impaired tissue integrity Goals: Ulcer/skin breakdown will have a volume reduction of 30% by week 4 Date Initiated: 02/26/2019 Target Resolution Date: 03/29/2019 Goal Status: Active Interventions: Provide education on ulcer and skin care Treatment Activities: Topical wound management initiated : 02/26/2019 Notes: Electronic Signature(s) Signed: 03/25/2019 5:06:49 PM By: [name] Entered By: [name] on 03/25/2019 15:39:16 Melinda [name] ([id number]) -------------------------------------------------------------------------------- Pain Assessment Details Patient Name: Melinda [name] Date of Service: 03/25/2019 3:00 PM Medical Record Number: [id number] Patient Account Number: [id number] Date of Birth/Sex: [birth year] (62 y.o. F) Treating RN: Harold Barban Primary Care Leighanne Adolph: [name] Other Clinician: Referring Philemon Riedesel: [name] Treating Susano Cleckler/Extender: [name], [name]: 3 Active Problems Location of Pain Severity and Description of Pain Patient Has Paino No Site Locations Pain Management and Medication Current Pain Management: Electronic Signature(s) Signed: 03/25/2019 4:41:34 PM By: Harold Barban Entered By: Harold Barban on 03/25/2019 15:00:02 Melinda [name] ([id number]) -------------------------------------------------------------------------------- Patient/Caregiver Education Details Patient Name: Melinda [name] Date of Service: 03/25/2019  3:00 PM Medical Record Number: [id number] Patient Account Number: [id number] Date of Birth/Gender: 23-Apr-1956 (63 y.o. F) Treating RN: [name] Primary Care  Physician: [name] Other Clinician: Referring Physician: [name] Treating Physician/Extender: Sharalyn Ink in Treatment: 3 Education Assessment Education Provided To: Patient Education Topics Provided Wound/Skin Impairment: Handouts: Other: wound care as ordered Methods: Demonstration, Explain/Verbal Responses: State content correctly Electronic Signature(s) Signed: 03/25/2019 5:06:49 PM By: [name] Entered By: [name] on 03/25/2019 15:45:53 Melinda [name] ([id number]) -------------------------------------------------------------------------------- Wound Assessment Details Patient Name: Melinda [name] Date of Service: 03/25/2019 3:00 PM Medical Record Number: [id number] Patient Account Number: [id number] Date of Birth/Sex: [birth year] (63 y.o. F) Treating RN: Harold Barban Primary Care Lanasia Porras: [name] Other Clinician: Referring Leonard Hendler: [name] Treating Deniya Craigo/Extender: [name], [name]: 3 Wound Status Wound Number: 1 Primary Diabetic Wound/Ulcer of the Lower Extremity Etiology: Wound Location: Left Toe Great - Plantar Wound Open Wounding Event: Gradually Appeared Status: Date Acquired: 11/30/2018 Comorbid Cataracts, Coronary Artery Disease, Weeks Of Treatment: 3 History: Hypertension, Myocardial Infarction, Peripheral Clustered Wound: No Venous Disease, Type II Diabetes, Osteoarthritis, Osteomyelitis, Neuropathy Photos Wound Measurements Length: (cm) 1.4 % Reduction Width: (cm) 1.5 % Reduction Depth: (cm) 0.3 Epithelializ Area: (cm) 1.649 Tunneling: Volume: (cm) 0.495 Undermining in Area: 47.4% in Volume: 68.4% ation: None No : No Wound Description Classification: Grade 2 Foul Odor A Wound Margin: Epibole Slough/Fibr Exudate Amount: Medium Exudate Type: Serosanguineous Exudate Color: red, brown fter Cleansing: No ino Yes Wound Bed Granulation Amount: Large (67-100%) Exposed  Structure Granulation Quality: Pale Fascia Exposed: No Necrotic Amount: Small (1-33%) Fat Layer (Subcutaneous Tissue) Exposed: Yes Necrotic Quality: Adherent Slough Tendon Exposed: No Muscle Exposed: No Joint Exposed: No Bone Exposed: No Durante, [place]. ([id number]) Treatment Notes Wound #1 (Left, Plantar Toe Great) Notes prisma, Foam, conform Electronic Signature(s) Signed: 03/25/2019 4:41:34 PM By: Harold Barban Entered By: Harold Barban on 03/25/2019 15:08:24 Melinda [name] ([id number]) -------------------------------------------------------------------------------- Wound Assessment Details Patient Name: Melinda [name] Date of Service: 03/25/2019 3:00 PM Medical Record Number: [id number] Patient Account Number: [id number] Date of Birth/Sex: [birth year] (63 y.o. F) Treating RN: Harold Barban Primary Care Eliah Ozawa: [name] Other Clinician: Referring Christien Frankl: [name] Treating Pesach Frisch/Extender: [name], [name]: 3 Wound Status Wound Number: 2 Primary Diabetic Wound/Ulcer of the Lower Extremity Etiology: Wound Location: Right Toe Great - Plantar Wound Open Wounding Event: Gradually Appeared Status: Date Acquired: 03/31/2018 Comorbid Cataracts, Coronary Artery Disease, Weeks Of Treatment: 3 History: Hypertension, Myocardial Infarction, Peripheral Clustered Wound: No Venous Disease, Type II Diabetes, Osteoarthritis, Osteomyelitis, Neuropathy Photos Wound Measurements Length: (cm) 0.4 % Reduction i Width: (cm) 0.3 % Reduction i Depth: (cm) 0.4 Epithelializa Area: (cm) 0.094 Tunneling: Volume: (cm) 0.038 Undermining: n Area: 0% n Volume: 42.4% tion: None No No Wound Description Classification: Grade 2 Foul Odor Af Wound Margin: Epibole Slough/Fibri Exudate Amount: Medium Exudate Type: Serosanguineous Exudate Color: red, brown ter Cleansing: No no Yes Wound Bed Granulation Amount: Small (1-33%) Exposed  Structure Granulation Quality: Pale Fascia Exposed: No Necrotic Amount: Large (67-100%) Fat Layer (Subcutaneous Tissue) Exposed: Yes Necrotic Quality: Adherent Slough Tendon Exposed: No Muscle Exposed: No Joint Exposed: No Bone Exposed: No Bro[place], [place]. ([id number]) Treatment Notes Wound #2 (Right, Plantar Toe Great) Notes prisma, Foam, conform Electronic Signature(s) Signed: 03/25/2019 4:41:34 PM By: Harold Barban Entered By: Harold Barban on 03/25/2019 15:08:45 Melinda [name] ([id number]) -------------------------------------------------------------------------------- Vitals Details Patient Name: Melinda [name] Date of Service: 03/25/2019 3:00 PM Medical  Record Number: [id number] Patient Account Number: [id number] Date of Birth/Sex: May 17, 1955 (63 y.o. F) Treating RN: Harold Barban Primary Care Mylani Gentry: [name] Other Clinician: Referring Nasra Counce: [name] Treating Zayden Maffei/Extender: [name], [name]: 3 Vital Signs Time Taken: 15:00 Temperature (F): 98.9 Height (in): 68 Pulse (bpm): 88 Weight (lbs): 300 Respiratory Rate (breaths/min): 18 Body Mass Index (BMI): 45.6 Blood Pressure (mmHg): 153/69 Reference Range: 80 - 120 mg / dl Electronic Signature(s) Signed: 03/25/2019 4:41:34 PM By: Harold Barban Entered By: Harold Barban on 03/25/2019 15:02:27

## 2019-03-31 ENCOUNTER — Ambulatory Visit: Payer: Medicare HMO | Admitting: Physician Assistant

## 2019-04-08 ENCOUNTER — Encounter: Payer: Medicare HMO | Attending: Physician Assistant | Admitting: Physician Assistant

## 2019-04-08 ENCOUNTER — Other Ambulatory Visit: Payer: Self-pay

## 2019-04-08 DIAGNOSIS — E11621 Type 2 diabetes mellitus with foot ulcer: Secondary | ICD-10-CM | POA: Insufficient documentation

## 2019-04-08 DIAGNOSIS — Z89422 Acquired absence of other left toe(s): Secondary | ICD-10-CM | POA: Insufficient documentation

## 2019-04-08 DIAGNOSIS — E1151 Type 2 diabetes mellitus with diabetic peripheral angiopathy without gangrene: Secondary | ICD-10-CM | POA: Insufficient documentation

## 2019-04-08 DIAGNOSIS — I2510 Atherosclerotic heart disease of native coronary artery without angina pectoris: Secondary | ICD-10-CM | POA: Insufficient documentation

## 2019-04-08 DIAGNOSIS — L97522 Non-pressure chronic ulcer of other part of left foot with fat layer exposed: Secondary | ICD-10-CM | POA: Insufficient documentation

## 2019-04-08 DIAGNOSIS — Z951 Presence of aortocoronary bypass graft: Secondary | ICD-10-CM | POA: Diagnosis not present

## 2019-04-08 DIAGNOSIS — Z89421 Acquired absence of other right toe(s): Secondary | ICD-10-CM | POA: Insufficient documentation

## 2019-04-08 DIAGNOSIS — E1142 Type 2 diabetes mellitus with diabetic polyneuropathy: Secondary | ICD-10-CM | POA: Insufficient documentation

## 2019-04-08 DIAGNOSIS — E669 Obesity, unspecified: Secondary | ICD-10-CM | POA: Diagnosis not present

## 2019-04-08 DIAGNOSIS — F17200 Nicotine dependence, unspecified, uncomplicated: Secondary | ICD-10-CM | POA: Diagnosis not present

## 2019-04-08 DIAGNOSIS — I10 Essential (primary) hypertension: Secondary | ICD-10-CM | POA: Diagnosis not present

## 2019-04-08 DIAGNOSIS — Z6841 Body Mass Index (BMI) 40.0 and over, adult: Secondary | ICD-10-CM | POA: Insufficient documentation

## 2019-04-08 DIAGNOSIS — L97529 Non-pressure chronic ulcer of other part of left foot with unspecified severity: Secondary | ICD-10-CM | POA: Diagnosis present

## 2019-04-08 DIAGNOSIS — Z95820 Peripheral vascular angioplasty status with implants and grafts: Secondary | ICD-10-CM | POA: Diagnosis not present

## 2019-04-08 DIAGNOSIS — Z794 Long term (current) use of insulin: Secondary | ICD-10-CM | POA: Diagnosis not present

## 2019-04-08 DIAGNOSIS — I252 Old myocardial infarction: Secondary | ICD-10-CM | POA: Diagnosis not present

## 2019-04-08 NOTE — Progress Notes (Signed)
Melinda, [name] ([id number]) Visit Report for 04/08/2019 Allergy List Details Patient Name: Melinda [name], Melinda [name] Date of Service: 04/08/2019 10:30 AM Medical Record Number: [id number] Patient Account Number: [id number] Date of Birth/Sex: Apr 05, 1956 (63 y.o. F) Treating RN: [name] Primary Care Jaylon Grode: [name] Other Clinician: Referring Emerie Vanderkolk: [name] Treating Truth Wolaver/Extender: [name], [name]: 5 Allergies Active Allergies No Known Drug Allergies Allergy Notes Bilateral foot ulcers Electronic Signature(s) Signed: 04/08/2019 12:03:29 PM By: [name] [name] Entered By: [name] on 04/08/2019 10:51:49 Melinda [name] ([id number]) -------------------------------------------------------------------------------- Patient/Caregiver Education Details Patient Name: Melinda [name] Date of Service: 04/08/2019 10:30 AM Medical Record Number: [id number] Patient Account Number: [id number] Date of Birth/Gender: 06-12-1955 (63 y.o. F) Treating RN: [name] Primary Care Physician: [name] Other Clinician: Referring Physician: [name] Treating Physician/Extender: Sharalyn Ink in Treatment: 5 Education Assessment Education Provided To: Patient Education Topics Provided Basic Hygiene: Handouts: Other: protecting right great toe site Methods: Demonstration, Explain/Verbal Responses: State content correctly Electronic Signature(s) Signed: 04/08/2019 4:13:22 PM By: [name] Entered By: [name] on 04/08/2019 10:52:09 Melinda [name] ([id number]) -------------------------------------------------------------------------------- Wound Assessment Details Patient Name: Melinda [name] Date of Service: 04/08/2019 10:30 AM Medical Record Number: [id number] Patient Account Number: [id number] Date of Birth/Sex: [birth year] (63 y.o. F) Treating RN: Army Melia Primary Care Kendell Sagraves: [name] Other Clinician: Referring  Willliam Pettet: [name] Treating Ineta Sinning/Extender: [name], [name]: 5 Wound Status Wound Number: 1 Primary Diabetic Wound/Ulcer of the Lower Extremity Etiology: Wound Location: Left Toe Great - Plantar Wound Open Wounding Event: Gradually Appeared Status: Date Acquired: 11/30/2018 Comorbid Cataracts, Coronary Artery Disease, Weeks Of Treatment: 5 History: Hypertension, Myocardial Infarction, Peripheral Clustered Wound: No Venous Disease, Type II Diabetes, Osteoarthritis, Osteomyelitis, Neuropathy Photos Wound Measurements Length: (cm) 1.4 % Reduction i Width: (cm) 1.2 % Reduction i Depth: (cm) 0.3 Epithelializa Area: (cm) 1.319 Tunneling: Volume: (cm) 0.396 Undermining: n Area: 57.9% n Volume: 74.7% tion: None No No Wound Description Classification: Grade 2 Foul Odor Af Wound Margin: Epibole Slough/Fibri Exudate Amount: Medium Exudate Type: Serosanguineous Exudate Color: red, brown ter Cleansing: No no Yes Wound Bed Granulation Amount: Large (67-100%) Exposed Structure Granulation Quality: Pale Fascia Exposed: No Necrotic Amount: Small (1-33%) Fat Layer (Subcutaneous Tissue) Exposed: Yes Necrotic Quality: Adherent Slough Tendon Exposed: No Muscle Exposed: No Joint Exposed: No Bone Exposed: No SKYLUR, ZOPPI ([id number]) Electronic Signature(s) Signed: 04/08/2019 11:19:43 AM By: Army Melia Entered By: Army Melia on 04/08/2019 10:24:28 Melinda [name] ([id number]) -------------------------------------------------------------------------------- Wound Assessment Details Patient Name: Melinda [name] Date of Service: 04/08/2019 10:30 AM Medical Record Number: [id number] Patient Account Number: [id number] Date of Birth/Sex: 1955-12-08 (63 y.o. F) Treating RN: [name] Primary Care Lorayne Getchell: [name] Other Clinician: Referring Niyana Chesbro: [name] Treating Kennetta Pavlovic/Extender: [name], [name]: 5 Wound  Status Wound Number: 2 Primary Diabetic Wound/Ulcer of the Lower Extremity Etiology: Wound Location: Right, Plantar Toe Great Wound Healed - Epithelialized Wounding Event: Gradually Appeared Status: Date Acquired: 03/31/2018 Comorbid Cataracts, Coronary Artery Disease, Weeks Of Treatment: 5 History: Hypertension, Myocardial Infarction, Peripheral Clustered Wound: No Venous Disease, Type II Diabetes, Osteoarthritis, Osteomyelitis, Neuropathy Photos Wound Measurements Length: (cm) 0 Width: (cm) 0 Depth: (cm) 0 Area: (cm) 0 Volume: (cm) 0 % Reduction in Area: 100% % Reduction in Volume: 100% Epithelialization: None Wound Description Classification: Grade 2 Foul Odo Wound Margin: Epibole Slough/F Exudate Amount: Medium Exudate Type: Serosanguineous Exudate Color: red, brown r After Cleansing: No ibrino  Yes Wound Bed Granulation Amount: Small (1-33%) Exposed Structure Granulation Quality: Pale Fascia Exposed: No Necrotic Amount: Large (67-100%) Fat Layer (Subcutaneous Tissue) Exposed: Yes Necrotic Quality: Adherent Slough Tendon Exposed: No Muscle Exposed: No Joint Exposed: No Bone Exposed: No JULIETTA, CRUMP ([id number]) Electronic Signature(s) Signed: 04/08/2019 4:13:22 PM By: [name] Entered By: [name] on 04/08/2019 10:48:40 Melinda [name] ([id number]) -------------------------------------------------------------------------------- Vitals Details Patient Name: Melinda [name] Date of Service: 04/08/2019 10:30 AM Medical Record Number: [id number] Patient Account Number: [id number] Date of Birth/Sex: 1956/01/01 (63 y.o. F) Treating RN: [name] Primary Care Shray Hunley: [name] Other Clinician: Referring Harper Smoker: [name] Treating Damauri Minion/Extender: [name], [name]: 5 Vital Signs Time Taken: 10:15 Temperature (F): 99.1 Height (in): 68 Pulse (bpm): 93 Weight (lbs): 300 Respiratory Rate (breaths/min): 16 Body  Mass Index (BMI): 45.6 Blood Pressure (mmHg): 124/47 Reference Range: 80 - 120 mg / dl Electronic Signature(s) Signed: 04/08/2019 11:59:11 AM By: Lorine Bears RCP, RRT, CHT Entered By: Lorine Bears on 04/08/2019 10:20:54

## 2019-04-08 NOTE — Progress Notes (Signed)
DOMINGO, CARP ([id number]) Visit Report for 04/08/2019 Chief Complaint Document Details Patient Name: Melinda [name], Melinda [name] Date of Service: 04/08/2019 10:30 AM Medical Record Number: [id number] Patient Account Number: [id number] Date of Birth/Sex: 1955-09-04 (63 y.o. F) Treating RN: [name] Primary Care Provider: [name] Other Clinician: Referring Provider: [name] Treating Provider/Extender: [name], [name]: 5 Information Obtained from: Patient Chief Complaint 02/26/2019; patient is here for review of wounds on her bilateral plantar feet Electronic Signature(s) Signed: 04/08/2019 12:03:29 PM By: [name] [name] Entered By: [name] on 04/08/2019 10:12:11 Melinda [name] ([id number]) -------------------------------------------------------------------------------- HPI Details Patient Name: Melinda [name] Date of Service: 04/08/2019 10:30 AM Medical Record Number: [id number] Patient Account Number: [id number] Date of Birth/Sex: 05/03/1955 (63 y.o. F) Treating RN: [name] Primary Care Provider: [name] Other Clinician: Referring Provider: [name] Treating Provider/Extender: [name], [name]: 5 History of Present Illness HPI Description: ADMISSION 02/26/2019 Patient is a 63 year old type II diabetic on insulin with significant polyneuropathy. She has been followed by Dr. Sherren Mocha cline of podiatry for problems related to her feet dating back to the early part of 2019 as I can review in [place] link. 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 Subsequently she was seen by Dr. Lucky Cowboy of vascular surgery and had stents x2 placed in her left SFA as well as left SFA angioplasties on 05/20/2017. She was noted to have a wound on her left foot in October 2019. In August 2020 on 8/24 she underwent a right anterior tibial artery angioplasty a right tibioperoneal trunk  angioplasty and a right SFA angioplasty. The patient states that she developed a left great toe wound in August which is at the base of her previous partial amputation in this area. She tells [name] that she has had a right great toe wound since December 2019 and she has been using Santyl to both of these areas that she received from a fellow parishioner at her church. By enlarge she has been using Neosporin to these areas and not offloading them specifically Arterial studies on 9/22 showed an ABI on the right of 0.71 with triphasic waveforms on the left at 0.88 with triphasic and biphasic waveforms. TBI's on the right and 0.44 and on the left at 1.05. 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 11//20; x-rays I did last week were both negative for osteomyelitis. She has a fairly large wound at the base of her left first toe and a small punched out area on the right first toe. We use silver alginate last week 03/13/2019 upon evaluation today patient appears to be doing okay with regard to her wounds at this point. She does have some callus buildup noted upon evaluation at this point. Fortunately there is no evidence of active infection which is also good news. I am going to have to perform some debridement to clear away some of the necrotic tissue today. 03/25/2019 on evaluation today patient appears to be doing well with regard to her foot ulcers. She has been tolerating the dressing changes without complication. Fortunately there is no signs of active infection at this time. Her left foot ulcer actually seems to be doing excellent no debridement even necessary today I am good have to perform some debridement on the right great toe. 04/08/19  on evaluation today patient actually appears to be doing well with regard to her wounds. In fact on  the right this appears to be completely healed on the left this is measuring smaller although there still like callous around the edges of the wound. Fortunately there's no evidence of active infection at this time there is some hyper granulation. Electronic Signature(s) Signed: 04/08/2019 12:03:29 PM By: [name] [name] Entered By: [name] on 04/08/2019 11:01:41 Melinda [name] ([id number]) -------------------------------------------------------------------------------- Physical Exam Details Patient Name: Melinda [name] Date of Service: 04/08/2019 10:30 AM Medical Record Number: [id number] Patient Account Number: [id number] Date of Birth/Sex: 12/22/1955 (63 y.o. F) Treating RN: [name] Primary Care Provider: [name] Other Clinician: Referring Provider: [name] Treating Provider/Extender: [name], [name]: 5 Constitutional sitting or standing blood pressure is within target range for patient.. Well-nourished and well-hydrated in no acute distress. Respiratory normal breathing without difficulty. Psychiatric this patient is able to make decisions and demonstrates good insight into disease process. Alert and Oriented x 3. pleasant and cooperative. Notes Patient's wound bed currently again on the right is healed on the left though hyper granular still seem to making some progress. I think she may benefit from switching to [facility] Dressing based on what I'm seeing at this point. That's something that I did somewhat discuss with her today as well I think it'll be a good way to go as far as her treatment course is concerned. When I give this a try over the next week. Will see were things stand at follow-up. Electronic Signature(s) Signed: 04/08/2019 12:03:29 PM By: [name] [name] Entered By: [name] on 04/08/2019 11:02:34 Melinda [name]  ([id number]) -------------------------------------------------------------------------------- Physician Orders Details Patient Name: Melinda [name] Date of Service: 04/08/2019 10:30 AM Medical Record Number: [id number] Patient Account Number: [id number] Date of Birth/Sex: October 10, 1955 (63 y.o. F) Treating RN: [name] Primary Care Provider: [name] Other Clinician: Referring Provider: [name] Treating Provider/Extender: [name], [name]: 5 Verbal / Phone Orders: No Diagnosis Coding ICD-10 Coding Code Description E11.621 Type 2 diabetes mellitus with foot ulcer L97.512 Non-pressure chronic ulcer of other part of right foot with fat layer exposed 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sed E11.51 Type 2 diabetes mellitus with diabetic peripheral angiopathy without gangrene E11.42 Type 2 diabetes mellitus with diabetic polyneuropathy Wound Cleansing Wound #1 Left,Plantar Toe Great o Cleanse wound with mild soap and water o No tub bath. Anesthetic (add to Medication List) Wound #1 Left,Plantar Toe Great o Topical Lidocaine 4% cream applied to wound bed prior to debridement (In Clinic Only). Primary Wound Dressing Wound #1 Left,Plantar Toe Great o Hydrafera Blue Ready Transfer Secondary Dressing Wound #1 Left,Plantar Toe Great o Gauze and Kerlix/Conform o Foam - continue protecting right great toe with foam and conform Dressing Change Frequency Wound #1 Left,Plantar Toe Great o Change dressing every other day. Follow-up Appointments o Return Appointment in 1 week. Edema Control Wound #1 Left,Plantar Toe Great o Elevate legs to the level of the heart and pump ankles as often as possible Off-Loading Wound #1 Left,Plantar Toe Great o Open toe surgical shoe with peg assist. - Bilateral Benton, Melinda E. ([id number]) Additional Orders / Instructions Wound #1 Left,Plantar Toe Great o Stop  Smoking o Increase protein intake. Electronic Signature(s) Signed: 04/08/2019 12:03:29 PM By: [name] [name] Signed: 04/08/2019 4:13:22 PM By: [name] Entered By: [name]  on 04/08/2019 10:51:23 Melinda [name], Melinda [name] ([id number]) -------------------------------------------------------------------------------- Problem List Details Patient Name: Melinda [name], GAMBA. Date of Service: 04/08/2019 10:30 AM Medical Record Number: [id number] Patient Account Number: [id number] Date of Birth/Sex: 1956/03/30 (63 y.o. F) Treating RN: [name] Primary Care Provider: [name] Other Clinician: Referring Provider: [name] Treating Provider/Extender: [name], [name]: 5 Active Problems ICD-10 Evaluated Encounter Code Description Active Date Today Diagnosis E11.621 Type 2 diabetes mellitus with foot ulcer 02/26/2019 No Yes L97.512 Non-pressure chronic ulcer of other part of right foot with fat 02/26/2019 No Yes layer exposed L97.522 Non-pressure chronic ulcer of other part of left foot with fat 02/26/2019 No Yes layer exposed E11.51 Type 2 diabetes mellitus with diabetic peripheral angiopathy 02/26/2019 No Yes without gangrene E11.42 Type 2 diabetes mellitus with diabetic polyneuropathy 02/26/2019 No Yes Inactive Problems Resolved Problems Electronic Signature(s) Signed: 04/08/2019 12:03:29 PM By: [name] [name] Entered By: [name] on 04/08/2019 10:12:05 Melinda [name] ([id number]) -------------------------------------------------------------------------------- Progress Note Details Patient Name: Melinda [name] Date of Service: 04/08/2019 10:30 AM Medical Record Number: [id number] Patient Account Number: [id number] Date of Birth/Sex: [birth year] (64 y.o. F) Treating RN: [name] Primary Care Provider: [name] Other Clinician: Referring Provider: [name] Treating Provider/Extender: [name], [name]:  5 Subjective Chief Complaint Information obtained from Patient 02/26/2019; patient is here for review of wounds on her bilateral plantar feet History of Present Illness (HPI) ADMISSION 02/26/2019 Patient is a 63 year old type II diabetic on insulin with significant polyneuropathy. She has been followed by Dr. Sherren Mocha cline of podiatry for problems related to her feet dating back to the early part of 2019 as I can review in [place] link. 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 Subsequently she was seen by Dr. Lucky Cowboy of vascular surgery and had stents x2 placed in her left SFA as well as left SFA angioplasties on 05/20/2017. She was noted to have a wound on her left foot in October 2019. In August 2020 on 8/24 she underwent a right anterior tibial artery angioplasty a right tibioperoneal trunk angioplasty and a right SFA angioplasty. The patient states that she developed a left great toe wound in August which is at the base of her previous partial amputation in this area. She tells [name] that she has had a right great toe wound since December 2019 and she has been using Santyl to both of these areas that she received from a fellow parishioner at her church. By enlarge she has been using Neosporin to these areas and not offloading them specifically Arterial studies on 9/22 showed an ABI on the right of 0.71 with triphasic waveforms on the left at 0.88 with triphasic and biphasic waveforms. TBI's on the right and 0.44 and on the left at 1.05. 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 11//20; x-rays I did last week were both negative for osteomyelitis. She has a fairly large wound at the base of her left first toe and a small punched out area on the right first toe. We use silver alginate  last week 03/13/2019 upon evaluation today patient appears to be doing okay with regard to her wounds at this point. She does have some callus buildup noted upon evaluation at this point. Fortunately there is no evidence of active infection which is  also good news. I am going to have to perform some debridement to clear away some of the necrotic tissue today. 03/25/2019 on evaluation today patient appears to be doing well with regard to her foot ulcers. She has been tolerating the dressing changes without complication. Fortunately there is no signs of active infection at this time. Her left foot ulcer actually seems to be doing excellent no debridement even necessary today I am good have to perform some debridement on the right great toe. 04/08/19 on evaluation today patient actually appears to be doing well with regard to her wounds. In fact on the right this appears to be completely healed on the left this is measuring smaller although there still like callous around the edges of the wound. Fortunately there's no evidence of active infection at this time there is some hyper granulation. Patient History Information obtained from Patient. Allergies SERITA, SILVER ([id number]) No Known Drug Allergies General Notes: Bilateral foot ulcers Family History Diabetes - Mother,Father, Heart Disease - Mother,Father, Hypertension - Mother, No family history of Cancer, Hereditary Spherocytosis, Kidney Disease, Lung Disease, Seizures, Stroke, Thyroid Problems, Tuberculosis. Social History Current every day smoker - 49 years, Alcohol Use - Never, Drug Use - No History, Caffeine Use - Daily. Medical History Eyes Patient has history of Cataracts Denies history of Glaucoma Ear/Nose/Mouth/Throat Denies history of Chronic sinus problems/congestion, Middle ear problems Hematologic/Lymphatic Denies history of Anemia, Hemophilia, Human Immunodeficiency Virus, Lymphedema, Sickle Cell  Disease Respiratory Denies history of Aspiration, Asthma, Chronic Obstructive Pulmonary Disease (COPD), Pneumothorax, Sleep Apnea, Tuberculosis Cardiovascular Patient has history of Coronary Artery Disease, Hypertension, Myocardial Infarction, Peripheral Venous Disease Denies history of Angina, Arrhythmia, Congestive Heart Failure, Deep Vein Thrombosis, Hypotension, Phlebitis, Vasculitis Gastrointestinal Denies history of Cirrhosis , Colitis, Crohn s, Hepatitis A, Hepatitis B, Hepatitis C Endocrine Patient has history of Type II Diabetes - IDDM Denies history of Type I Diabetes Genitourinary Denies history of End Stage Renal Disease Immunological Denies history of Lupus Erythematosus, Raynaud s, Scleroderma Integumentary (Skin) Denies history of History of Burn, History of pressure wounds Musculoskeletal Patient has history of Osteoarthritis - knee bilateral, Osteomyelitis Neurologic Patient has history of Neuropathy - peripheral Denies history of Dementia, Quadriplegia, Paraplegia, Seizure Disorder Medical And Surgical History Notes Cardiovascular ischemic cardiomyopathy, hyperlipidemia Review of Systems (ROS) Constitutional Symptoms (General Health) Denies complaints or symptoms of Fatigue, Fever, Chills, Marked Weight Change. Respiratory Denies complaints or symptoms of Chronic or frequent coughs, Shortness of Breath. Cardiovascular Denies complaints or symptoms of Chest pain, LE edema. Psychiatric Denies complaints or symptoms of Anxiety, Claustrophobia. Melinda [name], Melinda EMarland Kitchen ([id number]) Objective Constitutional sitting or standing blood pressure is within target range for patient.. Well-nourished and well-hydrated in no acute distress. Vitals Time Taken: 10:15 AM, Height: 68 in, Weight: 300 lbs, BMI: 45.6, Temperature: 99.1 F, Pulse: 93 bpm, Respiratory Rate: 16 breaths/min, Blood Pressure: 124/47 mmHg. Respiratory normal breathing without difficulty. Psychiatric this  patient is able to make decisions and demonstrates good insight into disease process. Alert and Oriented x 3. pleasant and cooperative. General Notes: Patient's wound bed currently again on the right is healed on the left though hyper granular still seem to making some progress. I think she may benefit from switching to [employer] Dressing based on what I'm seeing at this point. That's something that I did somewhat discuss with her today as well I think it'll be a good way to go as far as her treatment course is concerned. When I give this a try over the next  week. Will see were things stand at follow-up. Integumentary (Hair, Skin) Wound #1 status is Open. Original cause of wound was Gradually Appeared. The wound is located on the [employer]. The wound measures 1.4cm length x 1.2cm width x 0.3cm depth; 1.319cm^2 area and 0.396cm^3 volume. There is Fat Layer (Subcutaneous Tissue) Exposed exposed. There is no tunneling or undermining noted. There is a medium amount of serosanguineous drainage noted. The wound margin is epibole. There is large (67-100%) pale granulation within the wound bed. There is a small (1-33%) amount of necrotic tissue within the wound bed including Adherent Slough. Wound #2 status is Healed - Epithelialized. Original cause of wound was Gradually Appeared. The wound is located on the [employer]. The wound measures 0cm length x 0cm width x 0cm depth; 0cm^2 area and 0cm^3 volume. There is Fat Layer (Subcutaneous Tissue) Exposed exposed. There is a medium amount of serosanguineous drainage noted. The wound margin is epibole. There is small (1-33%) pale granulation within the wound bed. There is a large (67-100%) amount of necrotic tissue within the wound bed including Adherent Slough. Assessment Active Problems ICD-10 Type 2 diabetes mellitus with foot ulcer Non-pressure chronic ulcer of other part of right foot with fat layer exposed Non-pressure  chronic ulcer of other part of left foot with fat layer exposed Type 2 diabetes mellitus with diabetic peripheral angiopathy without gangrene Type 2 diabetes mellitus with diabetic polyneuropathy Tierno, Sharren E. ([id number]) Plan Wound Cleansing: Wound #1 Left,Plantar Toe Great: Cleanse wound with mild soap and water No tub bath. Anesthetic (add to Medication List): Wound #1 Left,Plantar Toe Great: Topical Lidocaine 4% cream applied to wound bed prior to debridement (In Clinic Only). Primary Wound Dressing: Wound #1 Left,Plantar Toe Great: Hydrafera Blue Ready Transfer Secondary Dressing: Wound #1 Left,Plantar Toe Great: Gauze and Kerlix/Conform Foam - continue protecting right great toe with foam and conform Dressing Change Frequency: Wound #1 Left,Plantar Toe Great: Change dressing every other day. Follow-up Appointments: Return Appointment in 1 week. Edema Control: Wound #1 Left,Plantar Toe Great: Elevate legs to the level of the heart and pump ankles as often as possible Off-Loading: Wound #1 Left,Plantar Toe Great: Open toe surgical shoe with peg assist. - Bilateral Additional Orders / Instructions: Wound #1 Left,Plantar Toe Great: Stop Smoking Increase protein intake. At this point I'm recommend that we go ahead and initiate treatment with the [facility] Dressing as of today we will discontinue the collagen. If you know how things progress we made switch back to the collagen but I'm hopeful the [facility] Dressing beneficial for her as well. The patient's agreement with this plan. We will subsequently see were things stand at follow-up. Please see above for specific wound care orders. We will see patient for re-evaluation in 1 week(s) here in the clinic. If anything worsens or changes patient will contact our office for additional recommendations. Patient was seen today by way of zoom or a telehealth visit she was in the clinic and I was working from home due to  being quarantined pending test results for Covid-19 Virus Electronic Signature(s) Signed: 04/08/2019 12:03:29 PM By: [name] [name] Entered By: [name] on 04/08/2019 11:03:28 Melinda [name] ([id number] [name], Melinda [name] ([id number]) -------------------------------------------------------------------------------- ROS/PFSH Details Patient Name: Melinda [name] Date of Service: 04/08/2019 10:30 AM Medical Record Number: [id number] Patient Account Number: [id number] Date of Birth/Sex: 04/24/1956 (63 y.o. F) Treating RN: [name] Primary Care Provider: [name] Other Clinician: Referring Provider: [name] Treating  Provider/Extender: [name], [name]: 5 Information Obtained From Patient Constitutional Symptoms (General Health) Complaints and Symptoms: Negative for: Fatigue; Fever; Chills; Marked Weight Change Respiratory Complaints and Symptoms: Negative for: Chronic or frequent coughs; Shortness of Breath Medical History: Negative for: Aspiration; Asthma; Chronic Obstructive Pulmonary Disease (COPD); Pneumothorax; Sleep Apnea; Tuberculosis Cardiovascular Complaints and Symptoms: Negative for: Chest pain; LE edema Medical History: Positive for: Coronary Artery Disease; Hypertension; Myocardial Infarction; Peripheral Venous Disease Negative for: Angina; Arrhythmia; Congestive Heart Failure; Deep Vein Thrombosis; Hypotension; Phlebitis; Vasculitis Past Medical History Notes: ischemic cardiomyopathy, hyperlipidemia Psychiatric Complaints and Symptoms: Negative for: Anxiety; Claustrophobia Eyes Medical History: Positive for: Cataracts Negative for: Glaucoma Ear/Nose/Mouth/Throat Medical History: Negative for: Chronic sinus problems/congestion; Middle ear problems Hematologic/Lymphatic Medical History: Negative for: Anemia; Hemophilia; Human Immunodeficiency Virus; Lymphedema; Sickle Cell Disease Gastrointestinal Melinda [name], Melinda [name]  ([id number]) Medical History: Negative for: Cirrhosis ; Colitis; Crohnos; Hepatitis A; Hepatitis B; Hepatitis C Endocrine Medical History: Positive for: Type II Diabetes - IDDM Negative for: Type I Diabetes Time with diabetes: 74 years Treated with: Insulin Blood sugar tested every day: Yes Tested : Blood sugar testing results: Breakfast: 82 Genitourinary Medical History: Negative for: End Stage Renal Disease Immunological Medical History: Negative for: Lupus Erythematosus; Raynaudos; Scleroderma Integumentary (Skin) Medical History: Negative for: History of Burn; History of pressure wounds Musculoskeletal Medical History: Positive for: Osteoarthritis - knee bilateral; Osteomyelitis Neurologic Medical History: Positive for: Neuropathy - peripheral Negative for: Dementia; Quadriplegia; Paraplegia; Seizure Disorder HBO Extended History Items Eyes: Cataracts Immunizations Pneumococcal Vaccine: Received Pneumococcal Vaccination: No Implantable Devices None Family and Social History Cancer: No; Diabetes: Yes - Mother,Father; Heart Disease: Yes - Mother,Father; Hereditary Spherocytosis: No; Hypertension: Yes - Mother; Kidney Disease: No; Lung Disease: No; Seizures: No; Stroke: No; Thyroid Problems: No; Tuberculosis: No; Current every day smoker - 49 years; Alcohol Use: Never; Drug Use: No History; Caffeine Use: Daily; Financial Concerns: No; Food, Clothing or Shelter Needs: No; Support System Lacking: No; Transportation Concerns: No Physician Affirmation Melinda [name], Melinda [name] ([id number]) I have reviewed and agree with the above information. Electronic Signature(s) Signed: 04/08/2019 12:03:29 PM By: [name] [name] Signed: 04/08/2019 4:13:22 PM By: [name] Entered By: [name] on 04/08/2019 11:01:03 Melinda [name] ([id number]) -------------------------------------------------------------------------------- SuperBill Details Patient Name: Melinda [name] Date  of Service: 04/08/2019 Medical Record Number: [id number] Patient Account Number: [id number] Date of Birth/Sex: December 12, 1955 (63 y.o. F) Treating RN: [name] Primary Care Provider: [name] Other Clinician: Referring Provider: [name] Treating Provider/Extender: [name], [name]: 5 Diagnosis Coding ICD-10 Codes Code Description E11.621 Type 2 diabetes mellitus with foot ulcer L97.512 Non-pressure chronic ulcer of other part of right foot with fat layer exposed 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sed E11.51 Type 2 diabetes mellitus with diabetic peripheral angiopathy without gangrene E11.42 Type 2 diabetes mellitus with diabetic polyneuropathy Facility Procedures CPT4 Code: [id number] Description: [facility] VISIT-LEV 3 EST PT Modifier: Quantity: 1 CPT4 Code: [id number] Description: [id number] Telehealth originating site facility fee. 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

## 2019-04-15 ENCOUNTER — Encounter: Payer: Medicare HMO | Admitting: Physician Assistant

## 2019-04-15 ENCOUNTER — Other Ambulatory Visit: Payer: Self-pay

## 2019-04-15 DIAGNOSIS — E11621 Type 2 diabetes mellitus with foot ulcer: Secondary | ICD-10-CM | POA: Diagnosis not present

## 2019-04-15 NOTE — Progress Notes (Addendum)
LARRISSA, REIFSCHNEIDER ([id number]) Visit Report for 04/15/2019 Chief Complaint Document Details Patient Name: Melinda [name], Melinda [name] Date of Service: 04/15/2019 10:45 AM Medical Record Number: [id number] Patient Account Number: [id number] Date of Birth/Sex: 1955/05/28 (63 y.o. F) Treating RN: [name] Primary Care Provider: [name] Other Clinician: Referring Provider: [name] Treating Provider/Extender: [name], Cristina Mattern Weeks in Treatment: 6 Information Obtained from: Patient Chief Complaint Left great toe ulcer and left LE skin tear Electronic Signature(s) Signed: 04/15/2019 10:59:19 AM By: [name] [name] Previous Signature: 04/15/2019 10:34:32 AM Version By: [name] [name] Entered By: [name] on 04/15/2019 10:59:18 Melinda [name] ([id number]) -------------------------------------------------------------------------------- Debridement Details Patient Name: Melinda [name] Date of Service: 04/15/2019 10:45 AM Medical Record Number: [id number] Patient Account Number: [id number] Date of Birth/Sex: Jul 22, 1955 (63 y.o. F) Treating RN: [name] Primary Care Provider: [name] Other Clinician: Referring Provider: [name] Treating Provider/Extender: [name], Travion Ke Weeks in Treatment: 6 Debridement Performed for Wound #1 Left,Plantar Toe Great Assessment: Performed By: Physician [name], Coran Dipaola E., [name] Debridement Type: Debridement Severity of Tissue Pre Fat layer exposed Debridement: Level of Consciousness (Pre- Awake and Alert procedure): Pre-procedure Verification/Time Yes - 10:46 Out Taken: Start Time: 10:46 Pain Control: Lidocaine 4% Topical Solution Total Area Debrided (L x W): 1.2 (cm) x 1.4 (cm) = 1.68 (cm) Tissue and other material Viable, Non-Viable, Callus, Slough, Subcutaneous, Slough debrided: Level: Skin/Subcutaneous Tissue Debridement Description: Excisional Instrument: Curette Bleeding: Minimum Hemostasis Achieved:  Pressure End Time: 10:53 Procedural Pain: 0 Post Procedural Pain: 0 Response to Treatment: Procedure was tolerated well Level of Consciousness Awake and Alert (Post-procedure): Post Debridement Measurements of Total Wound Length: (cm) 1.3 Width: (cm) 1.5 Depth: (cm) 0.2 Volume: (cm) 0.306 Character of Wound/Ulcer Post Debridement: Improved Severity of Tissue Post Debridement: Fat layer exposed Post Procedure Diagnosis Same as Pre-procedure Electronic Signature(s) Signed: 04/15/2019 4:30:49 PM By: [name] Signed: 04/16/2019 6:11:58 PM By: [name] [name] Entered By: [name] on 04/15/2019 10:53:38 Melinda [name] ([id number]) -------------------------------------------------------------------------------- HPI Details Patient Name: Melinda [name] Date of Service: 04/15/2019 10:45 AM Medical Record Number: [id number] Patient Account Number: [id number] Date of Birth/Sex: 1955-05-11 (63 y.o. F) Treating RN: [name] Primary Care Provider: [name] Other Clinician: Referring Provider: [name] Treating Provider/Extender: [name], Yuvan Medinger Weeks in Treatment: 6 History of Present Illness HPI Description: ADMISSION 02/26/2019 Patient is a 63 year old type II diabetic on insulin with significant polyneuropathy. She has been followed by Dr. Sherren Mocha cline of podiatry for problems related to her feet dating back to the early part of 2019 as I can review in [place] link. 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 Subsequently she was seen by Dr. Lucky Cowboy of vascular surgery and had stents x2 placed in her left SFA as well as left SFA angioplasties on 05/20/2017. She was noted to have a wound on her left foot in October 2019. In August 2020 on 8/24 she underwent a right anterior tibial artery angioplasty a right tibioperoneal trunk angioplasty and a right SFA angioplasty. The patient states that she developed a left  great toe wound in August which is at the base of her previous partial amputation in this area. She tells [name] that she has had a right great toe wound since December 2019 and she has been using Santyl to both of these areas that she received from a fellow parishioner at her church. By enlarge she has been using Neosporin  to these areas and not offloading them specifically Arterial studies on 9/22 showed an ABI on the right of 0.71 with triphasic waveforms on the left at 0.88 with triphasic and biphasic waveforms. TBI's on the right and 0.44 and on the left at 1.05. 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 11//20; x-rays I did last week were both negative for osteomyelitis. She has a fairly large wound at the base of her left first toe and a small punched out area on the right first toe. We use silver alginate last week 03/13/2019 upon evaluation today patient appears to be doing okay with regard to her wounds at this point. She does have some callus buildup noted upon evaluation at this point. Fortunately there is no evidence of active infection which is also good news. I am going to have to perform some debridement to clear away some of the necrotic tissue today. 03/25/2019 on evaluation today patient appears to be doing well with regard to her foot ulcers. She has been tolerating the dressing changes without complication. Fortunately there is no signs of active infection at this time. Her left foot ulcer actually seems to be doing excellent no debridement even necessary today I am good have to perform some debridement on the right great toe. 04/08/19 on evaluation today patient actually appears to be doing well with regard to her wounds. In fact on the right this appears to be completely healed on the left this is measuring smaller  although there still like callous around the edges of the wound. Fortunately there's no evidence of active infection at this time there is some hyper granulation. 04/15/2019 on evaluation today patient actually appears to be doing well with regard to her toe ulcer. This seems to be showing signs of excellent granulation there is minimal slough/biofilm on the surface of the wound. She does have a significant amount of callus around the edges of the wound but at the same time I feel like that this is something we can easily pared down without any complication. Fortunately there is no evidence of active infection at this point. No fevers, chills, nausea, vomiting, or diarrhea. Electronic Signature(s) Signed: 04/15/2019 10:56:45 AM By: [name] [name] Entered By: [name] on 04/15/2019 10:56:44 Melinda [name] ([id number] [name], Melinda [name] ([id number]) -------------------------------------------------------------------------------- Physical Exam Details Patient Name: Melinda [name], Melinda [name] Date of Service: 04/15/2019 10:45 AM Medical Record Number: [id number] Patient Account Number: [id number] Date of Birth/Sex: [birth year] (63 y.o. F) Treating RN: [name] Primary Care Provider: [name] Other Clinician: Referring Provider: [name] Treating Provider/Extender: [name], Abdulai Blaylock Weeks in Treatment: 6 Constitutional Well-nourished and well-hydrated in no acute distress. Respiratory normal breathing without difficulty. Psychiatric this patient is able to make decisions and demonstrates good insight into disease process. Alert and Oriented x 3. pleasant and cooperative. Notes Patient's wound bed currently showed signs again of good granulation I do believe the Hydrofera Blue is doing a great job for her and I do not see any signs of active infection at this point. No fevers, chills, nausea, vomiting, or diarrhea. I am very pleased with the progress and how things stand at this  time. I did debride the wound to clear away some of the callus as well as the slough and biofilm from the surface of the wound which the patient tolerated today minimal bleeding was noted.  Post debridement the wound bed appears to be doing significantly better which is great news. Electronic Signature(s) Signed: 04/15/2019 10:57:18 AM By: [name] [name] Entered By: [name] on 04/15/2019 10:57:17 Melinda [name] ([id number]) -------------------------------------------------------------------------------- Physician Orders Details Patient Name: Melinda [name] Date of Service: 04/15/2019 10:45 AM Medical Record Number: [id number] Patient Account Number: [id number] Date of Birth/Sex: [birth year] (63 y.o. F) Treating RN: [name] Primary Care Provider: [name] Other Clinician: Referring Provider: [name] Treating Provider/Extender: [name], Caleah Tortorelli Weeks in Treatment: 6 Verbal / Phone Orders: No Diagnosis Coding ICD-10 Coding Code Description E11.621 Type 2 diabetes mellitus with foot ulcer L97.512 Non-pressure chronic ulcer of other part of right foot with fat layer exposed 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sed E11.51 Type 2 diabetes mellitus with diabetic peripheral angiopathy without gangrene E11.42 Type 2 diabetes mellitus with diabetic polyneuropathy Wound Cleansing Wound #1 Left,Plantar Toe Great o Cleanse wound with mild soap and water o No tub bath. Wound #3 Left,Anterior Lower Leg o Cleanse wound with mild soap and water o No tub bath. Anesthetic (add to Medication List) Wound #1 Left,Plantar Toe Great o Topical Lidocaine 4% cream applied to wound bed prior to debridement (In Clinic Only). Primary Wound Dressing Wound #1 Left,Plantar Toe Great o Hydrafera Blue Ready Transfer Wound #3 Left,Anterior Lower Leg o Xeroform - cover with coverlet or bandaid Secondary Dressing Wound #1  Left,Plantar Toe Great o Gauze and Kerlix/Conform o Foam Dressing Change Frequency Wound #1 Left,Plantar Toe Great o Change dressing every other day. Wound #3 Left,Anterior Lower Leg o Change dressing every other day. Follow-up Appointments Melinda [name], Melinda [name] ([id number]) o Return Appointment in 1 week. Edema Control Wound #1 Left,Plantar Toe Great o Elevate legs to the level of the heart and pump ankles as often as possible Wound #3 Left,Anterior Lower Leg o Elevate legs to the level of the heart and pump ankles as often as possible Off-Loading Wound #1 Left,Plantar Toe Great o Open toe surgical shoe with peg assist. - Bilateral Additional Orders / Instructions o Stop Smoking o Increase protein intake. Electronic Signature(s) Signed: 04/15/2019 4:30:49 PM By: [name] Signed: 04/16/2019 6:11:58 PM By: [name] [name] Entered By: [name] on 04/15/2019 10:54:56 Melinda [name] ([id number]) -------------------------------------------------------------------------------- Problem List Details Patient Name: Melinda [name], Melinda [name] Date of Service: 04/15/2019 10:45 AM Medical Record Number: [id number] Patient Account Number: [id number] Date of Birth/Sex: 1955/12/15 (63 y.o. F) Treating RN: [name] Primary Care Provider: [name] Other Clinician: Referring Provider: [name] Treating Provider/Extender: [name], Jelesa Mangini Weeks in Treatment: 6 Active Problems ICD-10 Evaluated Encounter Code Description Active Date Today Diagnosis E11.621 Type 2 diabetes mellitus with foot ulcer 02/26/2019 No Yes E11.51 Type 2 diabetes mellitus with diabetic peripheral angiopathy 02/26/2019 No Yes without gangrene L97.522 Non-pressure chronic ulcer of other part of left foot with fat 02/26/2019 No Yes layer exposed S81.802A Unspecified open wound, left lower leg, initial encounter 04/15/2019 No Yes E11.42 Type 2 diabetes mellitus with diabetic  polyneuropathy 02/26/2019 No Yes Inactive Problems Resolved Problems ICD-10 Code Description Active Date Resolved Date L97.512 Non-pressure chronic ulcer of other part of right foot with fat layer 02/26/2019 02/26/2019 exposed Electronic Signature(s) Signed: 04/15/2019 10:58:49 AM By: [name] [name] Previous Signature: 04/15/2019 10:34:26 AM Version By: [name] [name] Entered By: [name] on 04/15/2019 10:58:49 Melinda [name] ([id number]) -------------------------------------------------------------------------------- Progress Note Details Patient Name: Melinda [name] Date of Service:  04/15/2019 10:45 AM Medical Record Number: [id number] Patient Account Number: [id number] Date of Birth/Sex: 10-03-1955 (63 y.o. 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 She has been followed by Dr. Sherren Mocha cline of podiatry for problems related to her feet dating back to the early part of 2019 as I can review in [place] link. 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 Subsequently she was seen by Dr. Lucky Cowboy of vascular surgery and had stents x2 placed in her left SFA as well as left SFA angioplasties on 05/20/2017. She was noted to have a wound on her left foot in October 2019. In August 2020 on 8/24 she underwent a right anterior tibial artery angioplasty a right tibioperoneal trunk angioplasty and a right SFA angioplasty. The patient states that she developed a left great toe wound in August which is at the base of her previous partial amputation in this  area. She tells [name] that she has had a right great toe wound since December 2019 and she has been using Santyl to both of these areas that she received from a fellow parishioner at her church. By enlarge she has been using Neosporin to these areas and not offloading them specifically Arterial studies on 9/22 showed an ABI on the right of 0.71 with triphasic waveforms on the left at 0.88 with triphasic and biphasic waveforms. TBI's on the right and 0.44 and on the left at 1.05. 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 11//20; x-rays I did last week were both negative for osteomyelitis. She has a fairly large wound at the base of her left first toe and a small punched out area on the right first toe. We use silver alginate last week 03/13/2019 upon evaluation today patient appears to be doing okay with regard to her wounds at this point. She does have some callus buildup noted upon evaluation at this point. Fortunately there is no evidence of active infection which is also good news. I am going to have to perform some debridement to clear away some of the necrotic tissue today. 03/25/2019 on evaluation today patient appears to be doing well with regard to her foot ulcers. She has been tolerating the dressing changes without complication. Fortunately there is no signs of active infection at this time. Her left foot ulcer actually seems to be doing excellent no debridement even necessary today I am good have to perform some debridement on the right great toe. 04/08/19 on evaluation today patient actually appears to be doing well with regard to her wounds. In fact on the right this appears to be completely healed on the left this is measuring smaller although there still like callous around the edges of the wound. Fortunately there's no  evidence of active infection at this time there is some hyper granulation. 04/15/2019 on evaluation today patient actually appears to be doing well with regard to her toe ulcer. This seems to be showing signs of excellent granulation there is minimal slough/biofilm on the surface of the wound. She does have a  significant amount of callus around the edges of the wound but at the same time I feel like that this is something we can easily pared down without any complication. Fortunately there is no evidence of active infection at this point. No fevers, chills, nausea, vomiting, or diarrhea. Melinda [name], Melinda [name] ([id number]) Patient History Information obtained from Patient. Family History Diabetes - Mother,Father, Heart Disease - Mother,Father, Hypertension - Mother, No family history of Cancer, Hereditary Spherocytosis, Kidney Disease, Lung Disease, Seizures, Stroke, Thyroid Problems, Tuberculosis. Social History Current every day smoker - 49 years, Alcohol Use - Never, Drug Use - No History, Caffeine Use - Daily. Medical History Eyes Patient has history of Cataracts Denies history of Glaucoma Ear/Nose/Mouth/Throat Denies history of Chronic sinus problems/congestion, Middle ear problems Hematologic/Lymphatic Denies history of Anemia, Hemophilia, Human Immunodeficiency Virus, Lymphedema, Sickle Cell Disease Respiratory Denies history of Aspiration, Asthma, Chronic Obstructive Pulmonary Disease (COPD), Pneumothorax, Sleep Apnea, Tuberculosis Cardiovascular Patient has history of Coronary Artery Disease, Hypertension, Myocardial Infarction, Peripheral Venous Disease Denies history of Angina, Arrhythmia, Congestive Heart Failure, Deep Vein Thrombosis, Hypotension, Phlebitis, Vasculitis Gastrointestinal Denies history of Cirrhosis , Colitis, Crohn s, Hepatitis A, Hepatitis B, Hepatitis C Endocrine Patient has history of Type II Diabetes - IDDM Denies history of Type I  Diabetes Genitourinary Denies history of End Stage Renal Disease Immunological Denies history of Lupus Erythematosus, Raynaud s, Scleroderma Integumentary (Skin) Denies history of History of Burn, History of pressure wounds Musculoskeletal Patient has history of Osteoarthritis - knee bilateral, Osteomyelitis Neurologic Patient has history of Neuropathy - peripheral Denies history of Dementia, Quadriplegia, Paraplegia, Seizure Disorder Medical And Surgical History Notes Cardiovascular ischemic cardiomyopathy, hyperlipidemia Review of Systems (ROS) Constitutional Symptoms (General Health) Denies complaints or symptoms of Fatigue, Fever, Chills, Marked Weight Change. Respiratory Denies complaints or symptoms of Chronic or frequent coughs, Shortness of Breath. Cardiovascular Denies complaints or symptoms of Chest pain, LE edema. Psychiatric Denies complaints or symptoms of Anxiety, Claustrophobia. Melinda [name], Melinda [name] ([id number]) Objective Constitutional Well-nourished and well-hydrated in no acute distress. Vitals Time Taken: 10:20 AM, Height: 68 in, Weight: 300 lbs, BMI: 45.6, Temperature: 98.9 F, Pulse: 86 bpm, Respiratory Rate: 16 breaths/min, Blood Pressure: 118/71 mmHg. Respiratory normal breathing without difficulty. Psychiatric this patient is able to make decisions and demonstrates good insight into disease process. Alert and Oriented x 3. pleasant and cooperative. General Notes: Patient's wound bed currently showed signs again of good granulation I do believe the Hydrofera Blue is doing a great job for her and I do not see any signs of active infection at this point. No fevers, chills, nausea, vomiting, or diarrhea. I am very pleased with the progress and how things stand at this time. I did debride the wound to clear away some of the callus as well as the slough and biofilm from the surface of the wound which the patient tolerated today minimal bleeding was noted. Post  debridement the wound bed appears to be doing significantly better which is great news. Integumentary (Hair, Skin) Wound #1 status is Open. Original cause of wound was Gradually Appeared. The wound is located on the [employer]. The wound measures 1.2cm length x 1.4cm width x 0.5cm depth; 1.319cm^2 area and 0.66cm^3 volume. There is Fat Layer (Subcutaneous Tissue) Exposed exposed. There is no tunneling or undermining noted. There is a medium amount of serosanguineous drainage noted. The wound margin is epibole. There is medium (34-66%) pale granulation within the wound bed. There is a medium (34-66%) amount of necrotic tissue within the wound  bed including Adherent Slough. Wound #3 status is Open. Original cause of wound was Trauma. The wound is located on the Left,Anterior Lower Leg. The wound measures 2.4cm length x 0.5cm width x 0.1cm depth; 0.942cm^2 area and 0.094cm^3 volume. The wound is limited to skin breakdown. There is no tunneling or undermining noted. There is a medium amount of sanguinous drainage noted. The wound margin is flat and intact. There is large (67-100%) pink granulation within the wound bed. There is no necrotic tissue within the wound bed. Assessment Active Problems ICD-10 Type 2 diabetes mellitus with foot ulcer Type 2 diabetes mellitus with diabetic peripheral angiopathy without gangrene Non-pressure chronic ulcer of other part of left foot with fat layer exposed Unspecified open wound, left lower leg, initial encounter Type 2 diabetes mellitus with diabetic polyneuropathy Melinda [name], Melinda E. ([id number]) Procedures Wound #1 Pre-procedure diagnosis of Wound #1 is a Diabetic Wound/Ulcer of the Lower Extremity located on the Left,Plantar Toe Great .Severity of Tissue Pre Debridement is: Fat layer exposed. There was a Excisional Skin/Subcutaneous Tissue Debridement with a total area of 1.68 sq cm performed by [name], Terrez Ander E., [name]. With the following  instrument(s): Curette to remove Viable and Non-Viable tissue/material. Material removed includes Callus, Subcutaneous Tissue, and Slough after achieving pain control using Lidocaine 4% Topical Solution. No specimens were taken. A time out was conducted at 10:46, prior to the start of the procedure. A Minimum amount of bleeding was controlled with Pressure. The procedure was tolerated well with a pain level of 0 throughout and a pain level of 0 following the procedure. Post Debridement Measurements: 1.3cm length x 1.5cm width x 0.2cm depth; 0.306cm^3 volume. Character of Wound/Ulcer Post Debridement is improved. Severity of Tissue Post Debridement is: Fat layer exposed. Post procedure Diagnosis Wound #1: Same as Pre-Procedure Plan Wound Cleansing: Wound #1 Left,Plantar Toe Great: Cleanse wound with mild soap and water No tub bath. Wound #3 Left,Anterior Lower Leg: Cleanse wound with mild soap and water No tub bath. Anesthetic (add to Medication List): Wound #1 Left,Plantar Toe Great: Topical Lidocaine 4% cream applied to wound bed prior to debridement (In Clinic Only). Primary Wound Dressing: Wound #1 Left,Plantar Toe Great: Hydrafera Blue Ready Transfer Wound #3 Left,Anterior Lower Leg: Xeroform - cover with coverlet or bandaid Secondary Dressing: Wound #1 Left,Plantar Toe Great: Gauze and Kerlix/Conform Foam Dressing Change Frequency: Wound #1 Left,Plantar Toe Great: Change dressing every other day. Wound #3 Left,Anterior Lower Leg: Change dressing every other day. Follow-up Appointments: Return Appointment in 1 week. Edema Control: Wound #1 Left,Plantar Toe Great: Elevate legs to the level of the heart and pump ankles as often as possible Wound #3 Left,Anterior Lower Leg: Elevate legs to the level of the heart and pump ankles as often as possible Off-Loading: Wound #1 Left,Plantar Toe Great: Open toe surgical shoe with peg assist. - Bilateral Melinda [name], Melinda E.  ([id number]) Additional Orders / Instructions: Stop Smoking Increase protein intake. 1. My suggestion at this time is good to be that we go ahead and continue with the [facility] dressing which seems to be doing excellent for the patient she is in agreement the plan. 2. I would recommend as well that we continue with the PEG assist offloading shoe which I believe is beneficial as well. 3. With regard to the new left anterior lower extremity skin tear I do believe this is something that needs to be taken care of although I think it will do quite well we can use a Xeroform gauze dressing  over this covered with a Band-Aid and I think that would do just fine. She is in agreement with that plan. We will see patient back for reevaluation in 1 week here in the clinic. If anything worsens or changes patient will contact our office for additional recommendations. Electronic Signature(s) Signed: 04/15/2019 10:59:52 AM By: [name] [name] Previous Signature: 04/15/2019 10:58:00 AM Version By: [name] [name] Entered By: [name] on 04/15/2019 10:59:52 Melinda [name] ([id number]) -------------------------------------------------------------------------------- ROS/PFSH Details Patient Name: Melinda [name] Date of Service: 04/15/2019 10:45 AM Medical Record Number: [id number] Patient Account Number: [id number] Date of Birth/Sex: 01/25/1956 (63 y.o. F) Treating RN: [name] Primary Care Provider: [name] Other Clinician: Referring Provider: [name] Treating Provider/Extender: [name], Trinty Marken Weeks in Treatment: 6 Information Obtained From Patient Constitutional Symptoms (General Health) Complaints and Symptoms: Negative for: Fatigue; Fever; Chills; Marked Weight Change Respiratory Complaints and Symptoms: Negative for: Chronic or frequent coughs; Shortness of Breath Medical History: Negative for: Aspiration; Asthma; Chronic Obstructive Pulmonary Disease  (COPD); Pneumothorax; Sleep Apnea; Tuberculosis Cardiovascular Complaints and Symptoms: Negative for: Chest pain; LE edema Medical History: Positive for: Coronary Artery Disease; Hypertension; Myocardial Infarction; Peripheral Venous Disease Negative for: Angina; Arrhythmia; Congestive Heart Failure; Deep Vein Thrombosis; Hypotension; Phlebitis; Vasculitis Past Medical History Notes: ischemic cardiomyopathy, hyperlipidemia Psychiatric Complaints and Symptoms: Negative for: Anxiety; Claustrophobia Eyes Medical History: Positive for: Cataracts Negative for: Glaucoma Ear/Nose/Mouth/Throat Medical History: Negative for: Chronic sinus problems/congestion; Middle ear problems Hematologic/Lymphatic Medical History: Negative for: Anemia; Hemophilia; Human Immunodeficiency Virus; Lymphedema; Sickle Cell Disease Gastrointestinal Melinda [name], Melinda [name] ([id number]) Medical History: Negative for: Cirrhosis ; Colitis; Crohnos; Hepatitis A; Hepatitis B; Hepatitis C Endocrine Medical History: Positive for: Type II Diabetes - IDDM Negative for: Type I Diabetes Time with diabetes: 92 years Treated with: Insulin Blood sugar tested every day: Yes Tested : Blood sugar testing results: Breakfast: 82 Genitourinary Medical History: Negative for: End Stage Renal Disease Immunological Medical History: Negative for: Lupus Erythematosus; Raynaudos; Scleroderma Integumentary (Skin) Medical History: Negative for: History of Burn; History of pressure wounds Musculoskeletal Medical History: Positive for: Osteoarthritis - knee bilateral; Osteomyelitis Neurologic Medical History: Positive for: Neuropathy - peripheral Negative for: Dementia; Quadriplegia; Paraplegia; Seizure Disorder HBO Extended History Items Eyes: Cataracts Immunizations Pneumococcal Vaccine: Received Pneumococcal Vaccination: No Implantable Devices None Family and Social History Cancer: No; Diabetes: Yes - Mother,Father;  Heart Disease: Yes - Mother,Father; Hereditary Spherocytosis: No; Hypertension: Yes - Mother; Kidney Disease: No; Lung Disease: No; Seizures: No; Stroke: No; Thyroid Problems: No; Tuberculosis: No; Current every day smoker - 49 years; Alcohol Use: Never; Drug Use: No History; Caffeine Use: Daily; Financial Concerns: No; Food, Clothing or Shelter Needs: No; Support System Lacking: No; Transportation Concerns: No Physician Affirmation Melinda [name], Melinda [name] ([id number]) I have reviewed and agree with the above information. Electronic Signature(s) Signed: 04/15/2019 4:30:49 PM By: [name] Signed: 04/16/2019 6:11:58 PM By: [name] [name] Entered By: [name] on 04/15/2019 10:56:58 Melinda [name] ([id number]) -------------------------------------------------------------------------------- SuperBill Details Patient Name: Melinda [name] Date of Service: 04/15/2019 Medical Record Number: [id number] Patient Account Number: [id number] Date of Birth/Sex: March 15, 1956 (63 y.o. F) Treating RN: [name] Primary Care Provider: [name] Other Clinician: Referring Provider: [name] Treating Provider/Extender: [name], Volney Reierson Weeks in Treatment: 6 Diagnosis Coding ICD-10 Codes Code Description E11.621 Type 2 diabetes mellitus with foot ulcer E11.51 Type 2 diabetes mellitus with diabetic peripheral angiopathy without gangrene L97.522 Non-pressure chronic ulcer of other part of left foot with fat  layer exposed S81.802A Unspecified open wound, left lower leg, initial encounter E11.42 Type 2 diabetes mellitus with diabetic polyneuropathy Facility Procedures CPT4 Code: [id number] Description: [id number] - DEB SUBQ TISSUE 20 SQ CM/< ICD-10 Diagnosis Description L97.522 Non-pressure chronic ulcer of other part of left foot with fat Modifier: layer exposed Quantity: 1 Physician Procedures CPT4 Code Description: [id number] - WC PHYS LEVEL 4 - EST PT ICD-10 Diagnosis Description  E11.621 Type 2 diabetes mellitus with foot ulcer E11.51 Type 2 diabetes mellitus with diabetic peripheral angiopathy wi L97.522 Non-pressure chronic ulcer of  other part of left foot with fat S81.802A Unspecified open wound, left lower leg, initial encounter Modifier: 25 thout gangrene layer exposed Quantity: 1 CPT4 Code Description: [id number] - WC PHYS SUBQ TISS 20 SQ CM ICD-10 Diagnosis Description L97.522 Non-pressure chronic ulcer of other part of left foot with fat Modifier: layer exposed Quantity: 1 Electronic Signature(s) Signed: 04/15/2019 11:00:28 AM By: [name] [name] Entered By: [name] on 04/15/2019 11:00:27

## 2019-04-15 NOTE — Progress Notes (Signed)
SISSIE, MARTUS ([id number]) Visit Report for 04/15/2019 Arrival Information Details Patient Name: Melinda [name], Melinda [name] Date of Service: 04/15/2019 10:45 AM Medical Record Number: [id number] Patient Account Number: [id number] Date of Birth/Sex: 01-17-1956 (63 y.o. F) Treating RN: [name] Primary Care Adalei Novell: [name] Other Clinician: Referring Aveion Nguyen: [name] Treating David Towson/Extender: [name], [name]: 6 Visit Information History Since Last Visit Added or deleted any medications: No Patient Arrived: Ambulatory Any new allergies or adverse reactions: No Arrival Time: 10:19 Had a fall or experienced change in No Accompanied By: self activities of daily living that may affect Transfer Assistance: None risk of falls: Patient Identification Verified: Yes Hospitalized since last visit: No Secondary Verification Process Yes Implantable device outside of the clinic excluding No Completed: cellular tissue based products placed in the center Patient Has Alerts: Yes since last visit: Patient Alerts: Patient on Blood Has Dressing in Place as Prescribed: Yes Thinner Pain Present Now: No Plavix, Aspirin 81mg  ABI's L .88/R .71 Electronic Signature(s) Signed: 04/15/2019 3:29:24 PM By: Lorine Bears RCP, RRT, CHT Entered By: Lorine Bears on 04/15/2019 10:19:56 Melinda [name] ([id number]) -------------------------------------------------------------------------------- Encounter Discharge Information Details Patient Name: Melinda [name] Date of Service: 04/15/2019 10:45 AM Medical Record Number: [id number] Patient Account Number: [id number] Date of Birth/Sex: 1956/04/06 (63 y.o. F) Treating RN: [name] Primary Care Tamicka Shimon: [name] Other Clinician: Referring Aneka Fagerstrom: [name] Treating Perina Salvaggio/Extender: [name], [name]: 6 Encounter Discharge Information Items Post Procedure  Vitals Discharge Condition: Stable Temperature (F): 98.9 Ambulatory Status: Ambulatory Pulse (bpm): 86 Discharge Destination: Home Respiratory Rate (breaths/min): 16 Transportation: Private Auto Blood Pressure (mmHg): 118/71 Accompanied By: self Schedule Follow-up Appointment: Yes Clinical Summary of Care: Electronic Signature(s) Signed: 04/15/2019 4:30:49 PM By: [name] Entered By: [name] on 04/15/2019 10:56:31 Melinda [name] ([id number]) -------------------------------------------------------------------------------- Lower Extremity Assessment Details Patient Name: Melinda [name] Date of Service: 04/15/2019 10:45 AM Medical Record Number: [id number] Patient Account Number: [id number] Date of Birth/Sex: [birth year] (63 y.o. F) Treating RN: Army Melia Primary Care Saman Umstead: [name] Other Clinician: Referring Darrin Koman: [name] Treating James Lafalce/Extender: [name], [name]: 6 Edema Assessment Assessed: [Left: No] [Right: No] Edema: [Left: N] [Right: o] Vascular Assessment Pulses: Dorsalis Pedis Palpable: [Left:Yes] Electronic Signature(s) Signed: 04/15/2019 4:14:53 PM By: Army Melia Entered By: Army Melia on 04/15/2019 10:26:31 Melinda [name] ([id number]) -------------------------------------------------------------------------------- Multi Wound Chart Details Patient Name: Melinda [name] Date of Service: 04/15/2019 10:45 AM Medical Record Number: [id number] Patient Account Number: [id number] Date of Birth/Sex: October 19, 1955 (63 y.o. F) Treating RN: [name] Primary Care Lylia Karn: [name] Other Clinician: Referring Siraj Dermody: [name] Treating Myrle Dues/Extender: [name], [name]: 6 Vital Signs Height(in): 56 Pulse(bpm): 57 Weight(lbs): 300 Blood Pressure(mmHg): 118/71 Body Mass Index(BMI): 46 Temperature(F): 98.9 Respiratory Rate 16 (breaths/min): Photos: [N/A:N/A] Wound  Location: Left Toe Great - Plantar Left Lower Leg - Anterior N/A Wounding Event: Gradually Appeared Trauma N/A Primary Etiology: Diabetic Wound/Ulcer of the Skin Tear N/A Lower Extremity Comorbid History: Cataracts, Coronary Artery Cataracts, Coronary Artery N/A Disease, Hypertension, Disease, Hypertension, Myocardial Infarction, Myocardial Infarction, Peripheral Venous Disease, Peripheral Venous Disease, Type II Diabetes, Type II Diabetes, Osteoarthritis, Osteomyelitis, Osteoarthritis, Osteomyelitis, Neuropathy Neuropathy Date Acquired: 11/30/2018 04/15/2019 N/A Weeks of Treatment: 6 0 N/A Wound Status: Open Open N/A Measurements L x W x D 1.2x1.4x0.5 2.4x0.5x0.1 N/A (cm) Area (cm) : 1.319 0.942 N/A Volume (cm) : 0.66 0.094 N/A % Reduction in Area: 57.90% N/A N/A %  Reduction in Volume: 57.90% N/A N/A Classification: Grade 2 Partial Thickness N/A Exudate Amount: Medium Medium N/A Exudate Type: Serosanguineous Sanguinous N/A Exudate Color: red, brown red N/A Wound Margin: Epibole Flat and Intact N/A Granulation Amount: Medium (34-66%) Large (67-100%) N/A Granulation Quality: Pale Pink N/A Necrotic Amount: Medium (34-66%) None Present (0%) N/A Exposed Structures: N/A Melinda [name], Melinda [name] ([id number]) Fat Layer (Subcutaneous Fascia: No Tissue) Exposed: Yes Fat Layer (Subcutaneous Fascia: No Tissue) Exposed: No Tendon: No Tendon: No Muscle: No Muscle: No Joint: No Joint: No Bone: No Bone: No Limited to Skin Breakdown Epithelialization: None Small (1-33%) N/A Treatment Notes Electronic Signature(s) Signed: 04/15/2019 4:30:49 PM By: [name] Entered By: [name] on 04/15/2019 10:47:28 Melinda [name] ([id number]) -------------------------------------------------------------------------------- [place] Details Patient Name: Melinda [name] Date of Service: 04/15/2019 10:45 AM Medical Record Number: [id number] Patient Account Number:  [id number] Date of Birth/Sex: [birth year] (63 y.o. F) Treating RN: [name] Primary Care Calia Napp: [name] Other Clinician: Referring Malillany Kazlauskas: [name] Treating Shanvi Moyd/Extender: [name], [name]: 6 Active Inactive Medication Nursing Diagnoses: Knowledge deficit related to medication safety: actual or potential Goals: Patient/caregiver will demonstrate understanding of all current medications Date Initiated: 02/26/2019 Target Resolution Date: 02/26/2019 Goal Status: Active Interventions: Assess for medication contraindications each visit where new medications are prescribed Notes: Necrotic Tissue Nursing Diagnoses: Impaired tissue integrity related to necrotic/devitalized tissue Goals: Necrotic/devitalized tissue will be minimized in the wound bed Date Initiated: 02/26/2019 Target Resolution Date: 03/06/2019 Goal Status: Active Interventions: Assess patient pain level pre-, during and post procedure and prior to discharge Treatment Activities: Apply topical anesthetic as ordered : 02/26/2019 Notes: Orientation to the Wound Care Program Nursing Diagnoses: Knowledge deficit related to the wound healing center program Goals: Patient/caregiver will verbalize understanding of the [place] Program Date Initiated: 02/26/2019 Target Resolution Date: 02/26/2019 Goal Status: Active Melinda [name], Melinda [name] ([id number]) Interventions: Provide education on orientation to the wound center Notes: Pressure Nursing Diagnoses: Knowledge deficit related to causes and risk factors for pressure ulcer development Knowledge deficit related to management of pressures ulcers Goals: Patient will remain free from development of additional pressure ulcers Date Initiated: 02/26/2019 Target Resolution Date: 02/26/2019 Goal Status: Active Patient will remain free of pressure ulcers Date Initiated: 02/26/2019 Target Resolution Date: 02/26/2019 Goal Status:  Active Patient/caregiver will verbalize understanding of pressure ulcer management Date Initiated: 02/26/2019 Target Resolution Date: 02/26/2019 Goal Status: Active Interventions: Assess: immobility, friction, shearing, incontinence upon admission and as needed Notes: Wound/Skin Impairment Nursing Diagnoses: Impaired tissue integrity Goals: Ulcer/skin breakdown will have a volume reduction of 30% by week 4 Date Initiated: 02/26/2019 Target Resolution Date: 03/29/2019 Goal Status: Active Interventions: Provide education on ulcer and skin care Treatment Activities: Topical wound management initiated : 02/26/2019 Notes: Electronic Signature(s) Signed: 04/15/2019 4:30:49 PM By: [name] Entered By: [name] on 04/15/2019 10:47:01 Melinda [name] ([id number]) -------------------------------------------------------------------------------- Pain Assessment Details Patient Name: Melinda [name] Date of Service: 04/15/2019 10:45 AM Medical Record Number: [id number] Patient Account Number: [id number] Date of Birth/Sex: 02-28-1956 (63 y.o. F) Treating RN: [name] Primary Care Juanangel Soderholm: [name] Other Clinician: Referring Caydin Yeatts: [name] Treating Bettyann Birchler/Extender: [name], [name]: 6 Active Problems Location of Pain Severity and Description of Pain Patient Has Paino No Site Locations Pain Management and Medication Current Pain Management: Electronic Signature(s) Signed: 04/15/2019 3:29:24 PM By: Paulla Fore, RRT, CHT Signed: 04/15/2019 4:30:49 PM By: [name] Entered By: Lorine Bears on 04/15/2019 10:20:04 Melinda Neighbors  EMarland [name] ([id number]) -------------------------------------------------------------------------------- Patient/Caregiver Education Details Patient Name: Melinda [name], Melinda [name] Date of Service: 04/15/2019 10:45 AM Medical Record Number: [id number] Patient Account Number: [id number] Date  of Birth/Gender: November 24, 1955 (63 y.o. F) Treating RN: [name] Primary Care Physician: [name] Other Clinician: Referring Physician: [name] Treating Physician/Extender: Sharalyn Ink in Treatment: 6 Education Assessment Education Provided To: Patient Education Topics Provided Wound/Skin Impairment: Handouts: Other: wound care as ordered Methods: Demonstration, Explain/Verbal Responses: State content correctly Electronic Signature(s) Signed: 04/15/2019 4:30:49 PM By: [name] Entered By: [name] on 04/15/2019 10:55:22 Melinda [name] ([id number]) -------------------------------------------------------------------------------- Wound Assessment Details Patient Name: Melinda [name] Date of Service: 04/15/2019 10:45 AM Medical Record Number: [id number] Patient Account Number: [id number] Date of Birth/Sex: 1955-11-02 (63 y.o. F) Treating RN: Army Melia Primary Care Camisha Srey: [name] Other Clinician: Referring Stanislaw Acton: [name] Treating Tenise Stetler/Extender: [name], [name]: 6 Wound Status Wound Number: 1 Primary Diabetic Wound/Ulcer of the Lower Extremity Etiology: Wound Location: Left Toe Great - Plantar Wound Open Wounding Event: Gradually Appeared Status: Date Acquired: 11/30/2018 Comorbid Cataracts, Coronary Artery Disease, Weeks Of Treatment: 6 History: Hypertension, Myocardial Infarction, Peripheral Clustered Wound: No Venous Disease, Type II Diabetes, Osteoarthritis, Osteomyelitis, Neuropathy Photos Wound Measurements Length: (cm) 1.2 % Reduction Width: (cm) 1.4 % Reduction Depth: (cm) 0.5 Epithelializ Area: (cm) 1.319 Tunneling: Volume: (cm) 0.66 Undermining in Area: 57.9% in Volume: 57.9% ation: None No : No Wound Description Classification: Grade 2 Foul Odor A Wound Margin: Epibole Slough/Fibr Exudate Amount: Medium Exudate Type: Serosanguineous Exudate Color: red, brown fter Cleansing:  No ino Yes Wound Bed Granulation Amount: Medium (34-66%) Exposed Structure Granulation Quality: Pale Fascia Exposed: No Necrotic Amount: Medium (34-66%) Fat Layer (Subcutaneous Tissue) Exposed: Yes Necrotic Quality: Adherent Slough Tendon Exposed: No Muscle Exposed: No Joint Exposed: No Bone Exposed: No [name], Melinda E. ([id number]) Treatment Notes Wound #1 (Left, Plantar Toe Great) Notes prisma, Foam, conform to foot, xeroform and coverlet to lower leg Electronic Signature(s) Signed: 04/15/2019 4:14:53 PM By: Army Melia Entered By: Army Melia on 04/15/2019 10:25:35 Melinda [name] ([id number]) -------------------------------------------------------------------------------- Wound Assessment Details Patient Name: Melinda [name] Date of Service: 04/15/2019 10:45 AM Medical Record Number: [id number] Patient Account Number: [id number] Date of Birth/Sex: [birth year] (63 y.o. F) Treating RN: [name] Primary Care Jo Booze: [name] Other Clinician: Referring Aadhira Heffernan: [name] Treating Sherrel Shafer/Extender: [name], [name]: 6 Wound Status Wound Number: 3 Primary Skin Tear Etiology: Wound Location: Left Lower Leg - Anterior Wound Open Wounding Event: Trauma Status: Date Acquired: 04/15/2019 Comorbid Cataracts, Coronary Artery Disease, Weeks Of Treatment: 0 History: Hypertension, Myocardial Infarction, Peripheral Clustered Wound: No Venous Disease, Type II Diabetes, Osteoarthritis, Osteomyelitis, Neuropathy Photos Wound Measurements Length: (cm) 2.4 Width: (cm) 0.5 Depth: (cm) 0.1 Area: (cm) 0.942 Volume: (cm) 0.094 % Reduction in Area: % Reduction in Volume: Epithelialization: Small (1-33%) Tunneling: No Undermining: No Wound Description Classification: Partial Thickness Foul Odor A Wound Margin: Flat and Intact Slough/Fibr Exudate Amount: Medium Exudate Type: Sanguinous Exudate Color: red fter Cleansing: No ino No Wound  Bed Granulation Amount: Large (67-100%) Exposed Structure Granulation Quality: Pink Fascia Exposed: No Necrotic Amount: None Present (0%) Fat Layer (Subcutaneous Tissue) Exposed: No Tendon Exposed: No Muscle Exposed: No Joint Exposed: No Bone Exposed: No Limited to Skin Breakdown [name], Melinda E. ([id number]) Treatment Notes Wound #3 (Left, Anterior Lower Leg) Notes prisma, Foam, conform to foot, xeroform and coverlet to lower leg Electronic Signature(s) Signed: 04/15/2019 4:30:49 PM By: [name] Entered By:  [name] on 04/15/2019 10:46:13 Melinda [name], Melinda [name] ([id number]) -------------------------------------------------------------------------------- Vitals Details Patient Name: Melinda [name], Melinda [name] Date of Service: 04/15/2019 10:45 AM Medical Record Number: [id number] Patient Account Number: [id number] Date of Birth/Sex: February 28, 1956 (63 y.o. F) Treating RN: [name] Primary Care Prakriti Carignan: [name] Other Clinician: Referring Dewain Platz: [name] Treating Osbaldo Mark/Extender: [name], [name]: 6 Vital Signs Time Taken: 10:20 Temperature (F): 98.9 Height (in): 68 Pulse (bpm): 86 Weight (lbs): 300 Respiratory Rate (breaths/min): 16 Body Mass Index (BMI): 45.6 Blood Pressure (mmHg): 118/71 Reference Range: 80 - 120 mg / dl Electronic Signature(s) Signed: 04/15/2019 3:29:24 PM By: Lorine Bears RCP, RRT, CHT Entered By: Lorine Bears on 04/15/2019 10:23:59

## 2019-04-22 ENCOUNTER — Encounter: Payer: Medicare HMO | Admitting: Physician Assistant

## 2019-04-22 ENCOUNTER — Other Ambulatory Visit: Payer: Self-pay

## 2019-04-22 DIAGNOSIS — E11621 Type 2 diabetes mellitus with foot ulcer: Secondary | ICD-10-CM | POA: Diagnosis not present

## 2019-04-22 NOTE — Progress Notes (Signed)
Melinda [name], Melinda [name] ([id number]) Visit Report for 04/22/2019 Chief Complaint Document Details Patient Name: Melinda [name], Melinda [name] Date of Service: 04/22/2019 10:45 AM Medical Record Number: [id number] Patient Account Number: [id number] Date of Birth/Sex: 1955/10/21 (63 y.o. F) Treating RN: [name] Primary Care Provider: [name] Other Clinician: Referring Provider: [name] Treating Provider/Extender: [name], Kailan Laws Weeks in Treatment: 7 Information Obtained from: Patient Chief Complaint Left great toe ulcer and left LE skin tear Electronic Signature(s) Signed: 04/22/2019 10:37:20 AM By: [name] [name] Entered By: [name] on 04/22/2019 10:37:20 Melinda [name] ([id number]) -------------------------------------------------------------------------------- Debridement Details Patient Name: Melinda [name] Date of Service: 04/22/2019 10:45 AM Medical Record Number: [id number] Patient Account Number: [id number] Date of Birth/Sex: Mar 17, 1956 (63 y.o. F) Treating RN: [name] Primary Care Provider: [name] Other Clinician: Referring Provider: [name] Treating Provider/Extender: [name], Moncia Annas Weeks in Treatment: 7 Debridement Performed for Wound #1 Left,Plantar Toe Great Assessment: Performed By: Physician [name], Samauri Kellenberger E., [name] Debridement Type: Debridement Severity of Tissue Pre Fat layer exposed Debridement: Level of Consciousness (Pre- Awake and Alert procedure): Pre-procedure Verification/Time Yes - 11:11 Out Taken: Start Time: 11:11 Pain Control: Lidocaine 4% Topical Solution Total Area Debrided (L x W): 0.9 (cm) x 1.4 (cm) = 1.26 (cm) Tissue and other material Viable, Non-Viable, Callus, Slough, Subcutaneous, Slough debrided: Level: Skin/Subcutaneous Tissue Debridement Description: Excisional Instrument: Curette Bleeding: Minimum Hemostasis Achieved: Pressure End Time: 11:17 Procedural Pain: 0 Post Procedural Pain:  0 Response to Treatment: Procedure was tolerated well Level of Consciousness Awake and Alert (Post-procedure): Post Debridement Measurements of Total Wound Length: (cm) 0.9 Width: (cm) 1.4 Depth: (cm) 0.2 Volume: (cm) 0.198 Character of Wound/Ulcer Post Debridement: Improved Severity of Tissue Post Debridement: Fat layer exposed Post Procedure Diagnosis Same as Pre-procedure Electronic Signature(s) Unsigned Entered By: [name] on 04/22/2019 11:16:04 Signature(s): Date(s): Melinda [name] ([id number]) -------------------------------------------------------------------------------- Physician Orders Details Patient Name: Melinda [name] Date of Service: 04/22/2019 10:45 AM Medical Record Number: [id number] Patient Account Number: [id number] Date of Birth/Sex: 1956-02-10 (63 y.o. F) Treating RN: [name] Primary Care Provider: [name] Other Clinician: Referring Provider: [name] Treating Provider/Extender: [name], Mathew Storck Weeks in Treatment: 7 Verbal / Phone Orders: No Diagnosis Coding ICD-10 Coding Code Description E11.621 Type 2 diabetes mellitus with foot ulcer E11.51 Type 2 diabetes mellitus with diabetic peripheral angiopathy without gangrene 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sed S81.802A Unspecified open wound, left lower leg, initial encounter E11.42 Type 2 diabetes mellitus with diabetic polyneuropathy Wound Cleansing Wound #1 Left,Plantar Toe Great o Cleanse wound with mild soap and water o No tub bath. Anesthetic (add to Medication List) Wound #1 Left,Plantar Toe Great o Topical Lidocaine 4% cream applied to wound bed prior to debridement (In Clinic Only). Primary Wound Dressing Wound #1 Left,Plantar Toe Great o Silver Alginate Secondary Dressing Wound #1 Left,Plantar Toe Great o Gauze and Kerlix/Conform o Foam Dressing Change Frequency Wound #1 Left,Plantar Toe Great o  Change dressing every other day. Follow-up Appointments o Return Appointment in 1 week. Edema Control Wound #1 Left,Plantar Toe Great o Elevate legs to the level of the heart and pump ankles as often as possible Off-Loading Wound #1 Left,Plantar Toe Great o Open toe surgical shoe with peg assist. Melinda [name], Melinda E. ([id number]) Additional Orders / Instructions o Stop Smoking o Increase protein intake. Electronic Signature(s) Unsigned Entered By: [name] on 04/22/2019 11:16:40 Signature(s): Date(s): Melinda [name] ([id number]) -------------------------------------------------------------------------------- Problem List Details  Patient Name: Melinda [name], Melinda [name] Date of Service: 04/22/2019 10:45 AM Medical Record Number: [id number] Patient Account Number: [id number] Date of Birth/Sex: [birth year] (63 y.o. F) Treating RN: [name] Primary Care Provider: [name] Other Clinician: Referring Provider: [name] Treating Provider/Extender: [name], Demari Gales Weeks in Treatment: 7 Active Problems ICD-10 Evaluated Encounter Code Description Active Date Today Diagnosis E11.621 Type 2 diabetes mellitus with foot ulcer 02/26/2019 No Yes E11.51 Type 2 diabetes mellitus with diabetic peripheral angiopathy 02/26/2019 No Yes without gangrene L97.522 Non-pressure chronic ulcer of other part of left foot with fat 02/26/2019 No Yes layer exposed S81.802A Unspecified open wound, left lower leg, initial encounter 04/15/2019 No Yes E11.42 Type 2 diabetes mellitus with diabetic polyneuropathy 02/26/2019 No Yes Inactive Problems Resolved Problems ICD-10 Code Description Active Date Resolved Date L97.512 Non-pressure chronic ulcer of other part of right foot with fat layer 02/26/2019 02/26/2019 exposed Electronic Signature(s) Signed: 04/22/2019 10:37:15 AM By: [name] [name] Entered By: [name] on 04/22/2019 10:37:14

## 2019-04-22 NOTE — Progress Notes (Signed)
Melinda [name] ([id number]) Visit Report for 04/22/2019 Arrival Information Details Patient Name: Melinda [name], Melinda [name] Date of Service: 04/22/2019 10:45 AM Medical Record Number: [id number] Patient Account Number: [id number] Date of Birth/Sex: 03/31/1956 (63 y.o. F) Treating RN: Army Melia Primary Care Justin Buechner: [name] Other Clinician: Referring Shjon Lizarraga: [name] Treating Maitland Lesiak/Extender: [name], [name]: 7 Visit Information History Since Last Visit Added or deleted any medications: No Patient Arrived: Ambulatory Any new allergies or adverse reactions: No Arrival Time: 10:41 Had a fall or experienced change in No Accompanied By: self activities of daily living that may affect Transfer Assistance: None risk of falls: Patient Identification Verified: Yes Signs or symptoms of abuse/neglect since last visito No Patient Has Alerts: Yes Hospitalized since last visit: No Patient Alerts: Patient on Blood Thinner Has Dressing in Place as Prescribed: Yes Plavix, Aspirin 81mg  Pain Present Now: No ABI's L .88/R .71 Electronic Signature(s) Signed: 04/22/2019 11:17:33 AM By: Army Melia Entered By: Army Melia on 04/22/2019 10:41:26 Melinda [name] ([id number]) -------------------------------------------------------------------------------- Encounter Discharge Information Details Patient Name: Melinda [name] Date of Service: 04/22/2019 10:45 AM Medical Record Number: [id number] Patient Account Number: [id number] Date of Birth/Sex: 1955-06-26 (63 y.o. F) Treating RN: [name] Primary Care Carmalita Wakefield: [name] Other Clinician: Referring Dejanira Pamintuan: [name] Treating Marnita Poirier/Extender: [name], [name]: 7 Encounter Discharge Information Items Post Procedure Vitals Discharge Condition: Stable Temperature (F): 99.0 Ambulatory Status: Ambulatory Pulse (bpm): 97 Discharge Destination: Home Respiratory Rate (breaths/min):  16 Transportation: Private Auto Blood Pressure (mmHg): 121/46 Accompanied By: self Schedule Follow-up Appointment: Yes Clinical Summary of Care: Electronic Signature(s) Unsigned Entered By: [name] on 04/22/2019 11:18:14 Signature(s): Date(s): Melinda [name] ([id number]) -------------------------------------------------------------------------------- Lower Extremity Assessment Details Patient Name: Melinda [name] Date of Service: 04/22/2019 10:45 AM Medical Record Number: [id number] Patient Account Number: [id number] Date of Birth/Sex: 1955-12-31 (63 y.o. F) Treating RN: Army Melia Primary Care Zevin Nevares: [name] Other Clinician: Referring Teancum Brule: [name] Treating Ilyanna Baillargeon/Extender: [name], [name]: 7 Edema Assessment Assessed: [Left: No] [Right: No] Edema: [Left: N] [Right: o] Vascular Assessment Pulses: Dorsalis Pedis Palpable: [Left:Yes] Electronic Signature(s) Signed: 04/22/2019 11:17:33 AM By: Army Melia Entered By: Army Melia on 04/22/2019 10:47:10 Melinda [name] ([id number]) -------------------------------------------------------------------------------- Multi Wound Chart Details Patient Name: Melinda [name] Date of Service: 04/22/2019 10:45 AM Medical Record Number: [id number] Patient Account Number: [id number] Date of Birth/Sex: 01-14-1956 (63 y.o. F) Treating RN: [name] Primary Care Brylan Seubert: [name] Other Clinician: Referring Rayder Sullenger: [name] Treating Melah Ebling/Extender: [name], [name]: 7 Vital Signs Height(in): 61 Pulse(bpm): 97 Weight(lbs): 300 Blood Pressure(mmHg): 121/46 Body Mass Index(BMI): 46 Temperature(F): 99.0 Respiratory Rate 16 (breaths/min): Photos: [N/A:N/A] Wound Location: Left Toe Great - Plantar Left, Anterior Lower Leg N/A Wounding Event: Gradually Appeared Trauma N/A Primary Etiology: Diabetic Wound/Ulcer of the Skin Tear N/A Lower  Extremity Comorbid History: Cataracts, Coronary Artery Cataracts, Coronary Artery N/A Disease, Hypertension, Disease, Hypertension, Myocardial Infarction, Myocardial Infarction, Peripheral Venous Disease, Peripheral Venous Disease, Type II Diabetes, Type II Diabetes, Osteoarthritis, Osteomyelitis, Osteoarthritis, Osteomyelitis, Neuropathy Neuropathy Date Acquired: 11/30/2018 04/15/2019 N/A Weeks of Treatment: 7 1 N/A Wound Status: Open Healed - Epithelialized N/A Measurements L x W x D 0.9x1.4x0.4 0x0x0 N/A (cm) Area (cm) : 0.99 0 N/A Volume (cm) : 0.396 0 N/A % Reduction in Area: 68.40% 100.00% N/A % Reduction in Volume: 74.70% 100.00% N/A Classification: Grade 2 Partial Thickness N/A Exudate Amount: Medium Medium N/A Exudate Type: Serosanguineous Sanguinous N/A Exudate  Color: red, brown red N/A Wound Margin: Epibole Flat and Intact N/A Granulation Amount: Medium (34-66%) Large (67-100%) N/A Granulation Quality: Pale Pink N/A Necrotic Amount: Medium (34-66%) None Present (0%) N/A Exposed Structures: N/A CANDINA, SOTTO ([id number]) Fat Layer (Subcutaneous Fascia: No Tissue) Exposed: Yes Fat Layer (Subcutaneous Fascia: No Tissue) Exposed: No Tendon: No Tendon: No Muscle: No Muscle: No Joint: No Joint: No Bone: No Bone: No Limited to Skin Breakdown Epithelialization: None Small (1-33%) N/A Treatment Notes Electronic Signature(s) Unsigned Entered By: [name] on 04/22/2019 11:07:54 Signature(s): Date(s): Melinda [name] ([id number]) -------------------------------------------------------------------------------- Multi-Disciplinary Care Plan Details Patient Name: Melinda [name] Date of Service: 04/22/2019 10:45 AM Medical Record Number: [id number] Patient Account Number: [id number] Date of Birth/Sex: 1955-05-27 (63 y.o. F) Treating RN: [name] Primary Care Vincentina Sollers: [name] Other Clinician: Referring Ethel Meisenheimer: [name] Treating  Ahkeem Goede/Extender: [name], [name]: 7 Active Inactive Medication Nursing Diagnoses: Knowledge deficit related to medication safety: actual or potential Goals: Patient/caregiver will demonstrate understanding of all current medications Date Initiated: 02/26/2019 Target Resolution Date: 02/26/2019 Goal Status: Active Interventions: Assess for medication contraindications each visit where new medications are prescribed Notes: Necrotic Tissue Nursing Diagnoses: Impaired tissue integrity related to necrotic/devitalized tissue Goals: Necrotic/devitalized tissue will be minimized in the wound bed Date Initiated: 02/26/2019 Target Resolution Date: 03/06/2019 Goal Status: Active Interventions: Assess patient pain level pre-, during and post procedure and prior to discharge Treatment Activities: Apply topical anesthetic as ordered : 02/26/2019 Notes: Orientation to the Wound Care Program Nursing Diagnoses: Knowledge deficit related to the wound healing center program Goals: Patient/caregiver will verbalize understanding of the [facility] Program Date Initiated: 02/26/2019 Target Resolution Date: 02/26/2019 Goal Status: Active DAZIYAH, HUY ([id number]) Interventions: Provide education on orientation to the wound center Notes: Pressure Nursing Diagnoses: Knowledge deficit related to causes and risk factors for pressure ulcer development Knowledge deficit related to management of pressures ulcers Goals: Patient will remain free from development of additional pressure ulcers Date Initiated: 02/26/2019 Target Resolution Date: 02/26/2019 Goal Status: Active Patient will remain free of pressure ulcers Date Initiated: 02/26/2019 Target Resolution Date: 02/26/2019 Goal Status: Active Patient/caregiver will verbalize understanding of pressure ulcer management Date Initiated: 02/26/2019 Target Resolution Date: 02/26/2019 Goal Status:  Active Interventions: Assess: immobility, friction, shearing, incontinence upon admission and as needed Notes: Wound/Skin Impairment Nursing Diagnoses: Impaired tissue integrity Goals: Ulcer/skin breakdown will have a volume reduction of 30% by week 4 Date Initiated: 02/26/2019 Target Resolution Date: 03/29/2019 Goal Status: Active Interventions: Provide education on ulcer and skin care Treatment Activities: Topical wound management initiated : 02/26/2019 Notes: Electronic Signature(s) Unsigned Entered By: [name] on 04/22/2019 11:07:47 Signature(s): Date(s): Melinda [name] ([id number]) -------------------------------------------------------------------------------- Pain Assessment Details Patient Name: LESBIA, LUNDELL. Date of Service: 04/22/2019 10:45 AM Medical Record Number: [id number] Patient Account Number: [id number] Date of Birth/Sex: 10/30/1955 (63 y.o. F) Treating RN: Army Melia Primary Care Myalynn Lingle: [name] Other Clinician: Referring Lucila Klecka: [name] Treating Angelea Penny/Extender: [name], [name]: 7 Active Problems Location of Pain Severity and Description of Pain Patient Has Paino No Site Locations Pain Management and Medication Current Pain Management: Electronic Signature(s) Signed: 04/22/2019 11:17:33 AM By: Army Melia Entered By: Army Melia on 04/22/2019 10:42:32 Melinda [name] ([id number]) -------------------------------------------------------------------------------- Patient/Caregiver Education Details Patient Name: Melinda [name] Date of Service: 04/22/2019 10:45 AM Medical Record Number: [id number] Patient Account Number: [id number] Date of Birth/Gender: [birth year] (63 y.o. F) Treating RN: [name] Primary Care Physician: [name] Other Clinician:  Referring Physician: [name] Treating Physician/Extender: Sharalyn Ink in Treatment: 7 Education Assessment Education Provided  To: Patient Education Topics Provided Wound/Skin Impairment: Handouts: Other: wound care as ordered Methods: Demonstration, Explain/Verbal Responses: State content correctly Electronic Signature(s) Unsigned Entered By: [name] on 04/22/2019 11:17:16 Signature(s): Date(s): Melinda [name] ([id number]) -------------------------------------------------------------------------------- Wound Assessment Details Patient Name: SHAKEYA, RAUDALES. Date of Service: 04/22/2019 10:45 AM Medical Record Number: [id number] Patient Account Number: [id number] Date of Birth/Sex: Oct 04, 1955 (63 y.o. F) Treating RN: Army Melia Primary Care Joeanne Robicheaux: [name] Other Clinician: Referring Derhonda Eastlick: [name] Treating Daviel Allegretto/Extender: [name], [name]: 7 Wound Status Wound Number: 1 Primary Diabetic Wound/Ulcer of the Lower Extremity Etiology: Wound Location: Left Toe Great - Plantar Wound Open Wounding Event: Gradually Appeared Status: Date Acquired: 11/30/2018 Comorbid Cataracts, Coronary Artery Disease, Weeks Of Treatment: 7 History: Hypertension, Myocardial Infarction, Peripheral Clustered Wound: No Venous Disease, Type II Diabetes, Osteoarthritis, Osteomyelitis, Neuropathy Photos Wound Measurements Length: (cm) 0.9 % Reduction Width: (cm) 1.4 % Reduction Depth: (cm) 0.4 Epithelializ Area: (cm) 0.99 Volume: (cm) 0.396 in Area: 68.4% in Volume: 74.7% ation: None Wound Description Classification: Grade 2 Foul Odor A Wound Margin: Epibole Slough/Fibr Exudate Amount: Medium Exudate Type: Serosanguineous Exudate Color: red, brown fter Cleansing: No ino Yes Wound Bed Granulation Amount: Medium (34-66%) Exposed Structure Granulation Quality: Pale Fascia Exposed: No Necrotic Amount: Medium (34-66%) Fat Layer (Subcutaneous Tissue) Exposed: Yes Necrotic Quality: Adherent Slough Tendon Exposed: No Muscle Exposed: No Joint Exposed: No Bone Exposed:  No Scholle, [place]. ([id number]) Treatment Notes Wound #1 (Left, Plantar Toe Great) Notes silvercel, Foam, conform to foot, coverlet to lower leg Electronic Signature(s) Signed: 04/22/2019 11:17:33 AM By: Army Melia Entered By: Army Melia on 04/22/2019 10:46:32 Melinda [name] ([id number]) -------------------------------------------------------------------------------- Wound Assessment Details Patient Name: Melinda [name] Date of Service: 04/22/2019 10:45 AM Medical Record Number: [id number] Patient Account Number: [id number] Date of Birth/Sex: 1956/02/05 (63 y.o. F) Treating RN: [name] Primary Care Zakyla Tonche: [name] Other Clinician: Referring Kaniyah Lisby: [name] Treating Honor Fairbank/Extender: [name], [name]: 7 Wound Status Wound Number: 3 Primary Skin Tear Etiology: Wound Location: Left, Anterior Lower Leg Wound Healed - Epithelialized Wounding Event: Trauma Status: Date Acquired: 04/15/2019 Comorbid Cataracts, Coronary Artery Disease, Weeks Of Treatment: 1 History: Hypertension, Myocardial Infarction, Peripheral Clustered Wound: No Venous Disease, Type II Diabetes, Osteoarthritis, Osteomyelitis, Neuropathy Photos Wound Measurements Length: (cm) 0 % Reduct Width: (cm) 0 % Reduct Depth: (cm) 0 Epitheli Area: (cm) 0 Tunneli Volume: (cm) 0 Undermi ion in Area: 100% ion in Volume: 100% alization: Small (1-33%) ng: No ning: No Wound Description Classification: Partial Thickness Foul Odo Wound Margin: Flat and Intact Slough/F Exudate Amount: Medium Exudate Type: Sanguinous Exudate Color: red r After Cleansing: No ibrino No Wound Bed Granulation Amount: Large (67-100%) Exposed Structure Granulation Quality: Pink Fascia Exposed: No Necrotic Amount: None Present (0%) Fat Layer (Subcutaneous Tissue) Exposed: No Tendon Exposed: No Muscle Exposed: No Joint Exposed: No Bone Exposed: No Limited to Skin Breakdown JERICAH, STEINHILBER ([id number]) Electronic Signature(s) Unsigned Entered By: [name] on 04/22/2019 11:07:40 Signature(s): Date(s): Melinda [name] ([id number]) -------------------------------------------------------------------------------- Vitals Details Patient Name: Melinda [name] Date of Service: 04/22/2019 10:45 AM Medical Record Number: [id number] Patient Account Number: [id number] Date of Birth/Sex: Nov 14, 1955 (63 y.o. F) Treating RN: Army Melia Primary Care Temika Sutphin: [name] Other Clinician: Referring Daizy Outen: [name] Treating Rolf Fells/Extender: [name], [name]: 7 Vital Signs Time Taken: 10:42 Temperature (F): 99.0  Height (in): 68 Pulse (bpm): 97 Weight (lbs): 300 Respiratory Rate (breaths/min): 16 Body Mass Index (BMI): 45.6 Blood Pressure (mmHg): 121/46 Reference Range: 80 - 120 mg / dl Electronic Signature(s) Signed: 04/22/2019 11:17:33 AM By: Army Melia Entered By: Army Melia on 04/22/2019 10:43:01

## 2019-04-29 ENCOUNTER — Other Ambulatory Visit: Payer: Self-pay

## 2019-04-29 ENCOUNTER — Encounter: Payer: Medicare HMO | Admitting: Internal Medicine

## 2019-04-29 DIAGNOSIS — E11621 Type 2 diabetes mellitus with foot ulcer: Secondary | ICD-10-CM | POA: Diagnosis not present

## 2019-04-29 NOTE — Progress Notes (Addendum)
Melinda, [name] ([id number]) Visit Report for 04/29/2019 HPI Details Patient Name: Melinda [name], Melinda [name] Date of Service: 04/29/2019 10:45 AM Medical Record Number: [id number] Patient Account Number: [id number] Date of Birth/Sex: [birth year] (63 y.o. F) Treating RN: [name] Primary Care Provider: [name] Other Clinician: Referring Provider: [name] Treating Provider/Extender: Beverly Gust in Treatment: 8 History of Present Illness HPI Description: ADMISSION 02/26/2019 Patient is a 63 year old type II diabetic on insulin with significant polyneuropathy. She has been followed by Dr. Sherren Mocha cline of podiatry for problems related to her feet dating back to the early part of 2019 as I can review in [place] link. 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 Subsequently she was seen by Dr. Lucky Cowboy of vascular surgery and had stents x2 placed in her left SFA as well as left SFA angioplasties on 05/20/2017. She was noted to have a wound on her left foot in October 2019. In August 2020 on 8/24 she underwent a right anterior tibial artery angioplasty a right tibioperoneal trunk angioplasty and a right SFA angioplasty. The patient states that she developed a left great toe wound in August which is at the base of her previous partial amputation in this area. She tells Melinda [name] that she has had a right great toe wound since December 2019 and she has been using Santyl to both of these areas that she received from a fellow parishioner at her church. By enlarge she has been using Neosporin to these areas and not offloading them specifically Arterial studies on 9/22 showed an ABI on the right of 0.71 with triphasic waveforms on the left at 0.88 with triphasic and biphasic waveforms. TBI's on the right and 0.44 and on the left at 1.05. Past medical history includes hypertension, type 2 diabetes with peripheral neuropathy, known PAD, coronary artery  disease status post CABG x4 in 2016 obesity, tobacco abuse, bilateral third toe amputations. 11//20; x-rays I did last week were both negative for osteomyelitis. She has a fairly large wound at the base of her left first toe and a small punched out area on the right first toe. We use silver alginate last week 03/13/2019 upon evaluation today patient appears to be doing okay with regard to her wounds at this point. She does have some callus buildup noted upon evaluation at this point. Fortunately there is no evidence of active infection which is also good news. I am going to have to perform some debridement to clear away some of the necrotic tissue today. 03/25/2019 on evaluation today patient appears to be doing well with regard to her foot ulcers. She has been tolerating the dressing changes without complication. Fortunately there is no signs of active infection at this time. Her left foot ulcer actually seems to be doing excellent no debridement even necessary today I am good have to perform some debridement on the right great toe. 04/08/19 on evaluation today patient actually appears to be doing well with regard to her wounds. In fact on the right this appears to be completely healed on the left this is measuring smaller although there still like callous around the edges of the wound. Fortunately there's no evidence of active infection at this time there is some hyper granulation. 04/15/2019 on evaluation today patient actually appears to be doing well with regard to her toe ulcer. This seems to be showing signs of excellent granulation there is minimal slough/biofilm on the surface of the wound. She does have  a significant amount of callus around the edges of the wound but at the same time I feel like that this is something we can easily pared down without any complication. Fortunately there is no evidence of active infection at this point. No fevers, chills, nausea, vomiting, or  diarrhea. 04/22/2019 on evaluation today patient appears to be doing somewhat better in regard to her wound. She has been tolerating Parchment, Melinda E. ([id number]) the dressing changes without complication. There is some callus noted at this point this can require some sharp debridement which I discussed with the patient as well. We will go ahead and proceed with debridement today to try to clear away some of this necrotic callus as well as clean off the biofilm/slough from the surface of the wound. 12/29-Patient returns at 1 week with regards to her left plantar foot wound which seems to be doing well, the callus was debrided around the wound the last time and seems to be doing much better since. Apparently it standing smaller, patient is a little discomforted by having to come every week to the clinic but she agrees to do that [employer]) Signed: 04/30/2019 9:16:50 AM By: Tobi Bastos MD, MBA Previous Signature: 04/29/2019 10:48:43 AM Version By: Tobi Bastos MD, MBA Entered By: Tobi Bastos on 04/30/2019 09:16:49 [name] ([id number]) -------------------------------------------------------------------------------- Physical Exam Details Patient Name: Melinda, [name] Date of Service: 04/29/2019 10:45 AM Medical Record Number: [id number] Patient Account Number: [id number] Date of Birth/Sex: 1955-07-29 (63 y.o. F) Treating RN: [name] Primary Care Provider: [name] Other Clinician: Referring Provider: [name] Treating Provider/Extender: Beverly Gust in Treatment: 8 Constitutional alert and oriented x 3. sitting or standing blood pressure is within target range for patient.. supine blood pressure is within target range for patient.. pulse regular and within target range for patient.Marland Kitchen respirations regular, non-labored and within target range for patient.Marland Kitchen temperature within target range for patient.. . . Well-nourished and well-hydrated in  no acute distress. Notes Left plantar foot wound with clean base, surrounding callus appears to be not excessive, not much maceration of the periwound area, Electronic Signature(s) Signed: 04/29/2019 10:49:21 AM By: Tobi Bastos MD, MBA Entered By: Tobi Bastos on 04/29/2019 10:49:20 [name] ([id number]) -------------------------------------------------------------------------------- Physician Orders Details Patient Name: [name]. Date of Service: 04/29/2019 10:45 AM Medical Record Number: [id number] Patient Account Number: [id number] Date of Birth/Sex: 1955-09-19 (63 y.o. F) Treating RN: [name] Primary Care Provider: [name] Other Clinician: Referring Provider: [name] Treating Provider/Extender: Beverly Gust in Treatment: 8 Verbal / Phone Orders: No Diagnosis Coding Wound Cleansing Wound #1 Left,Plantar Toe Great o Cleanse wound with mild soap and water o No tub bath. Anesthetic (add to Medication List) Wound #1 Left,Plantar Toe Great o Topical Lidocaine 4% cream applied to wound bed prior to debridement (In Clinic Only). Primary Wound Dressing Wound #1 Left,Plantar Toe Great o Silver Alginate Secondary Dressing Wound #1 Left,Plantar Toe Great o Gauze and Kerlix/Conform o Foam Dressing Change Frequency Wound #1 Left,Plantar Toe Great o Change dressing every other day. Follow-up Appointments o Return Appointment in 1 week. Edema Control Wound #1 Left,Plantar Toe Great o Elevate legs to the level of the heart and pump ankles as often as possible Off-Loading Wound #1 Left,Plantar Toe Great o Open toe surgical shoe with peg assist. Additional Orders / Instructions o Stop Smoking o Increase protein intake. Electronic Signature(s) Signed: 04/29/2019 4:37:17 PM By: Tobi Bastos MD, MBA Signed: 04/29/2019 4:50:32 PM By: [name] Entered  By: [name] on 04/29/2019 10:47:15 [name] ([id number]) Davina Poke, Sonia Side ([id number]) -------------------------------------------------------------------------------- Progress Note Details Patient Name: [name]. Date of Service: 04/29/2019 10:45 AM Medical Record Number: [id number] Patient Account Number: [id number] Date of Birth/Sex: June 29, 1955 (62 y.o. F) Treating RN: [name] Primary Care Provider: [name] Other Clinician: Referring Provider: [name] Treating Provider/Extender: Beverly Gust in Treatment: 8 Subjective History of Present Illness (HPI) ADMISSION 02/26/2019 Patient is a 63 year old type II diabetic on insulin with significant polyneuropathy. She has been followed by Dr. Sherren Mocha cline of podiatry for problems related to her feet dating back to the early part of 2019 as I can review in [place] link. 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 Subsequently she was seen by Dr. Lucky Cowboy of vascular surgery and had stents x2 placed in her left SFA as well as left SFA angioplasties on 05/20/2017. She was noted to have a wound on her left foot in October 2019. In August 2020 on 8/24 she underwent a right anterior tibial artery angioplasty a right tibioperoneal trunk angioplasty and a right SFA angioplasty. The patient states that she developed a left great toe wound in August which is at the base of her previous partial amputation in this area. She tells Melinda [name] that she has had a right great toe wound since December 2019 and she has been using Santyl to both of these areas that she received from a fellow parishioner at her church. By enlarge she has been using Neosporin to these areas and not offloading them specifically Arterial studies on 9/22 showed an ABI on the right of 0.71 with triphasic waveforms on the left at 0.88 with triphasic and biphasic waveforms. TBI's on the right and 0.44 and on the left at 1.05. Past medical history includes  hypertension, type 2 diabetes with peripheral neuropathy, known PAD, coronary artery disease status post CABG x4 in 2016 obesity, tobacco abuse, bilateral third toe amputations. 11//20; x-rays I did last week were both negative for osteomyelitis. She has a fairly large wound at the base of her left first toe and a small punched out area on the right first toe. We use silver alginate last week 03/13/2019 upon evaluation today patient appears to be doing okay with regard to her wounds at this point. She does have some callus buildup noted upon evaluation at this point. Fortunately there is no evidence of active infection which is also good news. I am going to have to perform some debridement to clear away some of the necrotic tissue today. 03/25/2019 on evaluation today patient appears to be doing well with regard to her foot ulcers. She has been tolerating the dressing changes without complication. Fortunately there is no signs of active infection at this time. Her left foot ulcer actually seems to be doing excellent no debridement even necessary today I am good have to perform some debridement on the right great toe. 04/08/19 on evaluation today patient actually appears to be doing well with regard to her wounds. In fact on the right this appears to be completely healed on the left this is measuring smaller although there still like callous around the edges of the wound. Fortunately there's no evidence of active infection at this time there is some hyper granulation. 04/15/2019 on evaluation today patient actually appears to be doing well with regard to her toe ulcer. This seems to be showing signs of excellent granulation there is minimal slough/biofilm  on the surface of the wound. She does have a significant amount of callus around the edges of the wound but at the same time I feel like that this is something we can easily pared down without any complication. Fortunately there is no evidence of  active infection at this point. No fevers, chills, nausea, vomiting, or diarrhea. 04/22/2019 on evaluation today patient appears to be doing somewhat better in regard to her wound. She has been tolerating the dressing changes without complication. There is some callus noted at this point this can require some sharp debridement which I discussed with the patient as well. We will go ahead and proceed with debridement today to try to clear away some of this necrotic callus as well as clean off the biofilm/slough from the surface of the wound. SOPHIA, MUHS ([id number]) 12/29-Patient returns at 1 week with regards to her left plantar foot wound which seems to be doing well, the callus was debrided around the wound the last time and seems to be doing much better since. Apparently it standing smaller, patient is a little discomforted by having to come every week to the clinic but she agrees to do that Objective Constitutional alert and oriented x 3. sitting or standing blood pressure is within target range for patient.. supine blood pressure is within target range for patient.. pulse regular and within target range for patient.Marland Kitchen respirations regular, non-labored and within target range for patient.Marland Kitchen temperature within target range for patient.. Well-nourished and well-hydrated in no acute distress. Vitals Time Taken: 10:31 AM, Height: 68 in, Weight: 300 lbs, BMI: 45.6, Temperature: 98.7 F, Pulse: 90 bpm, Respiratory Rate: 16 breaths/min, Blood Pressure: 127/74 mmHg. General Notes: Left plantar foot wound with clean base, surrounding callus appears to be not excessive, not much maceration of the periwound area, Integumentary (Hair, Skin) Wound #1 status is Open. Original cause of wound was Gradually Appeared. The wound is located on the [employer]. The wound measures 1.3cm length x 1.1cm width x 0.3cm depth; 1.123cm^2 area and 0.337cm^3 volume. There is Fat Layer (Subcutaneous Tissue)  Exposed exposed. There is a medium amount of serosanguineous drainage noted. The wound margin is epibole. There is medium (34-66%) pale granulation within the wound bed. There is a medium (34-66%) amount of necrotic tissue within the wound bed including Adherent Slough. Plan Wound Cleansing: Wound #1 Left,Plantar Toe Great: Cleanse wound with mild soap and water No tub bath. Anesthetic (add to Medication List): Wound #1 Left,Plantar Toe Great: Topical Lidocaine 4% cream applied to wound bed prior to debridement (In Clinic Only). Primary Wound Dressing: Wound #1 Left,Plantar Toe Great: Silver Alginate Secondary Dressing: Wound #1 Left,Plantar Toe Great: Gauze and Kerlix/Conform Foam Dressing Change Frequency: Wound #1 Left,Plantar Toe Great: Change dressing every other day. Follow-up Appointments: LEOKADIA, DORRIES ([id number]) Return Appointment in 1 week. Edema Control: Wound #1 Left,Plantar Toe Great: Elevate legs to the level of the heart and pump ankles as often as possible Off-Loading: Wound #1 Left,Plantar Toe Great: Open toe surgical shoe with peg assist. Additional Orders / Instructions: Stop Smoking Increase protein intake. 1. Left plantar foot wound, continue with silver alginate dressing, with gauze and Kerlix conform with dressing changes every other day 2. Return to clinic next week, we are making some progress Electronic Signature(s) Signed: 04/30/2019 9:17:11 AM By: Tobi Bastos MD, MBA Previous Signature: 04/29/2019 10:50:07 AM Version By: Tobi Bastos MD, MBA Entered By: Tobi Bastos on 04/30/2019 09:17:10 [name] ([id number]) -------------------------------------------------------------------------------- SuperBill Details Patient Name:  Sutch, Aislynn E. Date of Service: 04/29/2019 Medical Record Number: [id number] Patient Account Number: [id number] Date of Birth/Sex: 12/30/1955 (63 y.o. F) Treating RN: [name] Primary Care  Provider: [name] Other Clinician: Referring Provider: [name] Treating Provider/Extender: Beverly Gust in Treatment: 8 Diagnosis Coding ICD-10 Codes Code Description E11.621 Type 2 diabetes mellitus with foot ulcer E11.51 Type 2 diabetes mellitus with diabetic peripheral angiopathy without gangrene 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sed S81.802A Unspecified open wound, left lower leg, initial encounter E11.42 Type 2 diabetes mellitus with diabetic polyneuropathy Facility Procedures CPT4 Code: [id number] Description: 99213 - WOUND CARE VISIT-LEV 3 EST PT Modifier: Quantity: 1 Physician Procedures CPT4 Code: [id number] Description: [id number] - WC PHYS LEVEL 3 - EST PT ICD-10 Diagnosis Description E11.621 Type 2 diabetes mellitus with foot ulcer Modifier: Quantity: 1 Electronic Signature(s) Signed: 04/29/2019 10:50:28 AM By: Tobi Bastos MD, MBA Entered By: Tobi Bastos on 04/29/2019 10:50:27

## 2019-04-30 ENCOUNTER — Ambulatory Visit (INDEPENDENT_AMBULATORY_CARE_PROVIDER_SITE_OTHER): Payer: Medicare HMO

## 2019-04-30 ENCOUNTER — Encounter (INDEPENDENT_AMBULATORY_CARE_PROVIDER_SITE_OTHER): Payer: Self-pay | Admitting: Nurse Practitioner

## 2019-04-30 ENCOUNTER — Ambulatory Visit (INDEPENDENT_AMBULATORY_CARE_PROVIDER_SITE_OTHER): Payer: Medicare HMO | Admitting: Nurse Practitioner

## 2019-04-30 VITALS — BP 134/78 | HR 98 | Resp 19 | Ht 68.0 in | Wt 297.0 lb

## 2019-04-30 DIAGNOSIS — I7025 Atherosclerosis of native arteries of other extremities with ulceration: Secondary | ICD-10-CM | POA: Diagnosis not present

## 2019-04-30 DIAGNOSIS — E1159 Type 2 diabetes mellitus with other circulatory complications: Secondary | ICD-10-CM

## 2019-04-30 DIAGNOSIS — Z720 Tobacco use: Secondary | ICD-10-CM | POA: Diagnosis not present

## 2019-04-30 DIAGNOSIS — Z794 Long term (current) use of insulin: Secondary | ICD-10-CM

## 2019-04-30 DIAGNOSIS — I10 Essential (primary) hypertension: Secondary | ICD-10-CM

## 2019-04-30 MED ORDER — NICOTINE 21 MG/24HR TD PT24: 21.0000 mg | MEDICATED_PATCH | Freq: Every day | TRANSDERMAL | 0 refills | Status: DC

## 2019-04-30 NOTE — Progress Notes (Signed)
Melinda [name], Melinda [name] ([id number]) Visit Report for 04/29/2019 Arrival Information Details Patient Name: Melinda [name], Melinda [name] Date of Service: 04/29/2019 10:45 AM Medical Record Number: [id number] Patient Account Number: [id number] Date of Birth/Sex: [birth year] (63 y.o. F) Treating RN: Army Melia Primary Care Madelon Welsch: [name] Other Clinician: Referring Yahye Siebert: [name] Treating Jenicka Coxe/Extender: Beverly Gust in Treatment: 8 Visit Information History Since Last Visit Added or deleted any medications: No Patient Arrived: Ambulatory Any new allergies or adverse reactions: No Arrival Time: 10:30 Had a fall or experienced change in No Accompanied By: self activities of daily living that may affect Transfer Assistance: None risk of falls: Patient Identification Verified: Yes Signs or symptoms of abuse/neglect since last visito No Patient Has Alerts: Yes Hospitalized since last visit: No Patient Alerts: Patient on Blood Thinner Has Dressing in Place as Prescribed: Yes Plavix, Aspirin 81mg  Pain Present Now: No ABI's L .88/R .71 Electronic Signature(s) Signed: 04/30/2019 9:57:21 AM By: Army Melia Entered By: Army Melia on 04/29/2019 10:31:05 Melinda [name] ([id number]) -------------------------------------------------------------------------------- Clinic Level of Care Assessment Details Patient Name: Melinda [name] Date of Service: 04/29/2019 10:45 AM Medical Record Number: [id number] Patient Account Number: [id number] Date of Birth/Sex: 10-18-1955 (63 y.o. F) Treating RN: [name] Primary Care Coleston Dirosa: [name] Other Clinician: Referring Danzig Macgregor: [name] Treating Evianna Chandran/Extender: Beverly Gust in Treatment: 8 Clinic Level of Care Assessment Items TOOL 4 Quantity Score []  - Use when only an EandM is performed on FOLLOW-UP visit 0 ASSESSMENTS - Nursing Assessment / Reassessment X - Reassessment of Co-morbidities (includes updates in  patient status) 1 10 X- 1 5 Reassessment of Adherence to Treatment Plan ASSESSMENTS - Wound and Skin Assessment / Reassessment X - Simple Wound Assessment / Reassessment - one wound 1 5 []  - 0 Complex Wound Assessment / Reassessment - multiple wounds []  - 0 Dermatologic / Skin Assessment (not related to wound area) ASSESSMENTS - Focused Assessment []  - Circumferential Edema Measurements - multi extremities 0 []  - 0 Nutritional Assessment / Counseling / Intervention X- 1 5 Lower Extremity Assessment (monofilament, tuning fork, pulses) []  - 0 Peripheral Arterial Disease Assessment (using hand held doppler) ASSESSMENTS - Ostomy and/or Continence Assessment and Care []  - Incontinence Assessment and Management 0 []  - 0 Ostomy Care Assessment and Management (repouching, etc.) PROCESS - Coordination of Care X - Simple Patient / Family Education for ongoing care 1 15 []  - 0 Complex (extensive) Patient / Family Education for ongoing care X- 1 10 Staff obtains Programmer, systems, Records, Test Results / Process Orders []  - 0 Staff telephones HHA, Nursing Homes / Clarify orders / etc []  - 0 Routine Transfer to another Facility (non-emergent condition) []  - 0 Routine Hospital Admission (non-emergent condition) []  - 0 New Admissions / Biomedical engineer / Ordering NPWT, Apligraf, etc. []  - 0 Emergency Hospital Admission (emergent condition) X- 1 10 Simple Discharge Coordination Melinda [name], Melinda [name] ([id number]) []  - 0 Complex (extensive) Discharge Coordination PROCESS - Special Needs []  - Pediatric / Minor Patient Management 0 []  - 0 Isolation Patient Management []  - 0 Hearing / Language / Visual special needs []  - 0 Assessment of Community assistance (transportation, D/C planning, etc.) []  - 0 Additional assistance / Altered mentation []  - 0 Support Surface(s) Assessment (bed, cushion, seat, etc.) INTERVENTIONS - Wound Cleansing / Measurement X - Simple Wound Cleansing - one wound 1  5 []  - 0 Complex Wound Cleansing - multiple wounds X- 1 5 Wound Imaging (photographs - any number of wounds) []  - 0  Wound Tracing (instead of photographs) X- 1 5 Simple Wound Measurement - one wound []  - 0 Complex Wound Measurement - multiple wounds INTERVENTIONS - Wound Dressings X - Small Wound Dressing one or multiple wounds 1 10 []  - 0 Medium Wound Dressing one or multiple wounds []  - 0 Large Wound Dressing one or multiple wounds []  - 0 Application of Medications - topical []  - 0 Application of Medications - injection INTERVENTIONS - Miscellaneous []  - External ear exam 0 []  - 0 Specimen Collection (cultures, biopsies, blood, body fluids, etc.) []  - 0 Specimen(s) / Culture(s) sent or taken to Lab for analysis []  - 0 Patient Transfer (multiple staff / Civil Service fast streamer / Similar devices) []  - 0 Simple Staple / Suture removal (25 or less) []  - 0 Complex Staple / Suture removal (26 or more) []  - 0 Hypo / Hyperglycemic Management (close monitor of Blood Glucose) []  - 0 Ankle / Brachial Index (ABI) - do not check if billed separately X- 1 5 Vital Signs Melinda [name], Melinda E. ([id number]) Has the patient been seen at the hospital within the last three years: Yes Total Score: 90 Level Of Care: New/Established - Level 3 Electronic Signature(s) Signed: 04/29/2019 4:50:32 PM By: [name] Entered By: [name] on 04/29/2019 10:47:41 Melinda [name] ([id number]) -------------------------------------------------------------------------------- Encounter Discharge Information Details Patient Name: Melinda [name] Date of Service: 04/29/2019 10:45 AM Medical Record Number: [id number] Patient Account Number: [id number] Date of Birth/Sex: [birth year] (63 y.o. F) Treating RN: [name] Primary Care Jex Strausbaugh: [name] Other Clinician: Referring Melinda [name]: [name] Treating Marcelyn Ruppe/Extender: Beverly Gust in Treatment: 8 Encounter Discharge Information  Items Discharge Condition: Stable Ambulatory Status: Ambulatory Discharge Destination: Home Transportation: Private Auto Accompanied By: self Schedule Follow-up Appointment: Yes Clinical Summary of Care: Electronic Signature(s) Signed: 04/29/2019 4:50:32 PM By: [name] Entered By: [name] on 04/29/2019 10:48:38 Melinda [name] ([id number]) -------------------------------------------------------------------------------- Lower Extremity Assessment Details Patient Name: Melinda [name] Date of Service: 04/29/2019 10:45 AM Medical Record Number: [id number] Patient Account Number: [id number] Date of Birth/Sex: 1956/01/29 (63 y.o. F) Treating RN: Army Melia Primary Care Abbigael Detlefsen: [name] Other Clinician: Referring Jahyra Sukup: [name] Treating Tyisha Cressy/Extender: Beverly Gust in Treatment: 8 Edema Assessment Assessed: [Left: No] [Right: No] Edema: [Left: N] [Right: o] Vascular Assessment Pulses: Dorsalis Pedis Palpable: [Left:Yes] Electronic Signature(s) Signed: 04/30/2019 9:57:21 AM By: Army Melia Entered By: Army Melia on 04/29/2019 10:35:49 Melinda [name] ([id number]) -------------------------------------------------------------------------------- Multi Wound Chart Details Patient Name: Melinda [name] Date of Service: 04/29/2019 10:45 AM Medical Record Number: [id number] Patient Account Number: [id number] Date of Birth/Sex: Dec 04, 1955 (63 y.o. F) Treating RN: [name] Primary Care Chonita Gadea: [name] Other Clinician: Referring Ainsley Sanguinetti: [name] Treating Nkosi Cortright/Extender: Beverly Gust in Treatment: 8 Vital Signs Height(in): 68 Pulse(bpm): 90 Weight(lbs): 300 Blood Pressure(mmHg): 127/74 Body Mass Index(BMI): 46 Temperature(F): 98.7 Respiratory Rate 16 (breaths/min): Photos: [N/A:N/A] Wound Location: Left Toe Great - Plantar N/A N/A Wounding Event: Gradually Appeared N/A N/A Primary Etiology: Diabetic  Wound/Ulcer of the N/A N/A Lower Extremity Comorbid History: Cataracts, Coronary Artery N/A N/A Disease, Hypertension, Myocardial Infarction, Peripheral Venous Disease, Type II Diabetes, Osteoarthritis, Osteomyelitis, Neuropathy Date Acquired: 11/30/2018 N/A N/A Weeks of Treatment: 8 N/A N/A Wound Status: Open N/A N/A Measurements L x W x D 1.3x1.1x0.3 N/A N/A (cm) Area (cm) : 1.123 N/A N/A Volume (cm) : 0.337 N/A N/A % Reduction in Area: 64.20% N/A N/A % Reduction in Volume: 78.50% N/A N/A Classification: Grade 2 N/A N/A Exudate Amount: Medium  N/A N/A Exudate Type: Serosanguineous N/A N/A Exudate Color: red, brown N/A N/A Wound Margin: Epibole N/A N/A Granulation Amount: Medium (34-66%) N/A N/A Granulation Quality: Pale N/A N/A Necrotic Amount: Medium (34-66%) N/A N/A Exposed Structures: N/A N/A Melinda [name], Melinda [name] ([id number]) Fat Layer (Subcutaneous Tissue) Exposed: Yes Fascia: No Tendon: No Muscle: No Joint: No Bone: No Epithelialization: None N/A N/A Treatment Notes Electronic Signature(s) Signed: 04/29/2019 4:50:32 PM By: [name] Entered By: [name] on 04/29/2019 10:45:57 Melinda [name] ([id number]) -------------------------------------------------------------------------------- Ferdinand Details Patient Name: Melinda [name] Date of Service: 04/29/2019 10:45 AM Medical Record Number: [id number] Patient Account Number: [id number] Date of Birth/Sex: July 02, 1955 (63 y.o. F) Treating RN: [name] Primary Care Edel Rivero: [name] Other Clinician: Referring Kolten Ryback: [name] Treating Trayvon Trumbull/Extender: Beverly Gust in Treatment: 8 Active Inactive Medication Nursing Diagnoses: Knowledge deficit related to medication safety: actual or potential Goals: Patient/caregiver will demonstrate understanding of all current medications Date Initiated: 02/26/2019 Target Resolution Date: 02/26/2019 Goal Status:  Active Interventions: Assess for medication contraindications each visit where new medications are prescribed Notes: Necrotic Tissue Nursing Diagnoses: Impaired tissue integrity related to necrotic/devitalized tissue Goals: Necrotic/devitalized tissue will be minimized in the wound bed Date Initiated: 02/26/2019 Target Resolution Date: 03/06/2019 Goal Status: Active Interventions: Assess patient pain level pre-, during and post procedure and prior to discharge Treatment Activities: Apply topical anesthetic as ordered : 02/26/2019 Notes: Orientation to the Wound Care Program Nursing Diagnoses: Knowledge deficit related to the wound healing center program Goals: Patient/caregiver will verbalize understanding of the [facility] Program Date Initiated: 02/26/2019 Target Resolution Date: 02/26/2019 Goal Status: Active Melinda [name], Melinda [name] ([id number]) Interventions: Provide education on orientation to the wound center Notes: Pressure Nursing Diagnoses: Knowledge deficit related to causes and risk factors for pressure ulcer development Knowledge deficit related to management of pressures ulcers Goals: Patient will remain free from development of additional pressure ulcers Date Initiated: 02/26/2019 Target Resolution Date: 02/26/2019 Goal Status: Active Patient will remain free of pressure ulcers Date Initiated: 02/26/2019 Target Resolution Date: 02/26/2019 Goal Status: Active Patient/caregiver will verbalize understanding of pressure ulcer management Date Initiated: 02/26/2019 Target Resolution Date: 02/26/2019 Goal Status: Active Interventions: Assess: immobility, friction, shearing, incontinence upon admission and as needed Notes: Wound/Skin Impairment Nursing Diagnoses: Impaired tissue integrity Goals: Ulcer/skin breakdown will have a volume reduction of 30% by week 4 Date Initiated: 02/26/2019 Target Resolution Date: 03/29/2019 Goal Status:  Active Interventions: Provide education on ulcer and skin care Treatment Activities: Topical wound management initiated : 02/26/2019 Notes: Electronic Signature(s) Signed: 04/29/2019 4:50:32 PM By: [name] Entered By: [name] on 04/29/2019 10:45:50 Melinda [name] ([id number]) -------------------------------------------------------------------------------- Pain Assessment Details Patient Name: Melinda [name] Date of Service: 04/29/2019 10:45 AM Medical Record Number: [id number] Patient Account Number: [id number] Date of Birth/Sex: 1955-08-01 (63 y.o. F) Treating RN: Army Melia Primary Care Jayvin Hurrell: [name] Other Clinician: Referring Pang Robers: [name] Treating Ilissa Rosner/Extender: Beverly Gust in Treatment: 8 Active Problems Location of Pain Severity and Description of Pain Patient Has Paino No Site Locations Pain Management and Medication Current Pain Management: Electronic Signature(s) Signed: 04/30/2019 9:57:21 AM By: Army Melia Entered By: Army Melia on 04/29/2019 10:31:12 Melinda [name] ([id number]) -------------------------------------------------------------------------------- Patient/Caregiver Education Details Patient Name: Melinda [name] Date of Service: 04/29/2019 10:45 AM Medical Record Number: [id number] Patient Account Number: [id number] Date of Birth/Gender: 05/23/1955 (63 y.o. F) Treating RN: [name] Primary Care Physician: [name] Other Clinician: Referring Physician: [name] Treating Physician/Extender: Beverly Gust in Treatment:  8 Education Assessment Education Provided To: Patient Education Topics Provided Wound/Skin Impairment: Handouts: Other: wound care as ordered Methods: Demonstration, Explain/Verbal Responses: State content correctly Electronic Signature(s) Signed: 04/29/2019 4:50:32 PM By: [name] Entered By: [name] on 04/29/2019 10:48:02 Melinda [name]  ([id number]) -------------------------------------------------------------------------------- Wound Assessment Details Patient Name: Melinda [name] Date of Service: 04/29/2019 10:45 AM Medical Record Number: [id number] Patient Account Number: [id number] Date of Birth/Sex: [birth year] (63 y.o. F) Treating RN: Army Melia Primary Care Jina Olenick: [name] Other Clinician: Referring Kailin Leu: [name] Treating Cortina Vultaggio/Extender: Beverly Gust in Treatment: 8 Wound Status Wound Number: 1 Primary Diabetic Wound/Ulcer of the Lower Extremity Etiology: Wound Location: Left Toe Great - Plantar Wound Open Wounding Event: Gradually Appeared Status: Date Acquired: 11/30/2018 Comorbid Cataracts, Coronary Artery Disease, Weeks Of Treatment: 8 History: Hypertension, Myocardial Infarction, Peripheral Clustered Wound: No Venous Disease, Type II Diabetes, Osteoarthritis, Osteomyelitis, Neuropathy Photos Wound Measurements Length: (cm) 1.3 % Reduction Width: (cm) 1.1 % Reduction Depth: (cm) 0.3 Epithelializ Area: (cm) 1.123 Volume: (cm) 0.337 in Area: 64.2% in Volume: 78.5% ation: None Wound Description Classification: Grade 2 Foul Odor A Wound Margin: Epibole Slough/Fibr Exudate Amount: Medium Exudate Type: Serosanguineous Exudate Color: red, brown fter Cleansing: No ino Yes Wound Bed Granulation Amount: Medium (34-66%) Exposed Structure Granulation Quality: Pale Fascia Exposed: No Necrotic Amount: Medium (34-66%) Fat Layer (Subcutaneous Tissue) Exposed: Yes Necrotic Quality: Adherent Slough Tendon Exposed: No Muscle Exposed: No Joint Exposed: No Bone Exposed: No Melinda [name], Garceno. ([id number]) Treatment Notes Wound #1 (Left, Plantar Toe Great) Notes silvercel, Foam, conform to foot Electronic Signature(s) Signed: 04/30/2019 9:57:21 AM By: Army Melia Entered By: Army Melia on 04/29/2019 10:35:05 Melinda [name]  ([id number]) -------------------------------------------------------------------------------- Vitals Details Patient Name: Melinda [name] Date of Service: 04/29/2019 10:45 AM Medical Record Number: [id number] Patient Account Number: [id number] Date of Birth/Sex: 22-Jan-1956 (63 y.o. F) Treating RN: Army Melia Primary Care Dangela How: [name] Other Clinician: Referring Emmanuelle Coxe: [name] Treating Dagny Fiorentino/Extender: Beverly Gust in Treatment: 8 Vital Signs Time Taken: 10:31 Temperature (F): 98.7 Height (in): 68 Pulse (bpm): 90 Weight (lbs): 300 Respiratory Rate (breaths/min): 16 Body Mass Index (BMI): 45.6 Blood Pressure (mmHg): 127/74 Reference Range: 80 - 120 mg / dl Electronic Signature(s) Signed: 04/30/2019 9:57:21 AM By: Army Melia Entered By: Army Melia on 04/29/2019 10:32:55

## 2019-05-02 ENCOUNTER — Encounter (INDEPENDENT_AMBULATORY_CARE_PROVIDER_SITE_OTHER): Payer: Self-pay | Admitting: Nurse Practitioner

## 2019-05-02 NOTE — Progress Notes (Signed)
SUBJECTIVE:  Patient ID: Melinda [name], female    DOB: 11/12/1955, 64 y.o.   MRN: [id number] Chief Complaint  Patient presents with  . Follow-up    HPI  Melinda [name] is a 64 y.o. female   The patient is seen for evaluation of painful lower extremities and diminished pulses associated with ulceration of the foot.  The patient notes the ulcer has been present for multiple weeks and has been improving.  It is very painful and has had some drainage.  No specific history of trauma noted by the patient.  The patient denies fever or chills.  The patient does have diabetes which has been difficult to control.  Patient notes prior to the ulcer developing the extremities were painful particularly with ambulation or activity and the discomfort is very consistent day today. Typically, the pain occurs at less than one block, progress is as activity continues to the point that the patient must stop walking. Resting including standing still for several minutes allowed resumption of the activity and the ability to walk a similar distance before stopping again. Uneven terrain and inclined shorten the distance. The pain has been progressive over the past several years.   The patient denies rest pain or dangling of an extremity off the side of the bed during the night for relief. No prior interventions or surgeries.  No history of back problems or DJD of the lumbar sacral spine.   The patient denies amaurosis fugax or recent TIA symptoms. There are no recent neurological changes noted. The patient denies history of DVT, PE or superficial thrombophlebitis. The patient denies recent episodes of angina or shortness of breath.   R=0.74, L=1.04, previous studies on 01/21/19 R=0.71, L=0.88.  Monophasic right tibial artery waveforms with monophasic/biphasic left tibial artery waveforms.   Past Medical History:  Diagnosis Date  . Anxiety   . Coronary artery disease    a. 06/2014 NSTEMI s/p CABG x 4  (LIMA to LAD, VG to Diag, VG to OM, VG to PDA).  . Diabetic neuropathy ([place])   . GERD (gastroesophageal reflux disease)   . HTN (hypertension)   . Hyperlipidemia LDL goal <70   . IDDM (insulin dependent diabetes mellitus)   . Ischemic cardiomyopathy    a. 06/2014 Echo: EF 40-45%, HK of entire inferolateral and inferior myocardium c/w infarct of RCA/LCx, GR2DD, mild MR  . OA (osteoarthritis) of knee   . Obesity   . Osteoarthritis   . Osteomyelitis ([place])   . Peripheral neuropathy   . Peripheral vascular disease ([place])    a. 09/2016 Periph Angio: CTO R popliteal, CTO L prox/mid SFA->Med Rx; b. 05/2017 PTA: LSFA (Viabahn covered stent x 2), DEB to L Post Tibial; c. 06/2017 ABI: R 0.44, L 1.05.  . Tobacco abuse     Past Surgical History:  Procedure Laterality Date  . ABDOMINAL AORTOGRAM W/LOWER EXTREMITY N/A 10/27/2016   Procedure: Abdominal Aortogram w/Lower Extremity;  Surgeon: Nelva Bush, MD;  Location: [facility];  Service: Cardiovascular;  Laterality: N/A;  . AMPUTATION TOE Left 10/21/2015   Procedure: AMPUTATION TOE;  Surgeon: Sharlotte Alamo, DPM;  Location: [facility];  Service: Podiatry;  Laterality: Left;  . AMPUTATION TOE Left 05/15/2017   Procedure: AMPUTATION LEFT GREAT TOE;  Surgeon: Sharlotte Alamo, DPM;  Location: [facility];  Service: Podiatry;  Laterality: Left;  . AORTIC VALVE REPLACEMENT (AVR)/CORONARY ARTERY BYPASS GRAFTING (CABG)    . BREAST BIOPSY    . CARDIAC CATHETERIZATION  06/2014  95% stenosis mLAD, occlusion ostial OM1, 70% stenosis LCx, 95% stenosis mRCA, EF 45%.  . CORONARY ARTERY BYPASS GRAFT N/A 06/08/2014   Procedure: CORONARY ARTERY BYPASS GRAFTING (CABG);  Surgeon: Grace Isaac, MD;  Location: [place];  Service: Open Heart Surgery;  Laterality: N/A;  Times 4 using left internal mammary artery to LAD artery and endoscopically harvested bilateral saphenous vein to Obtuse Marginal, Diagonal and Posterior Descending coronary arteries.  . CT ABD W & PELVIS WO CM   06/2014   nl liver, gallbladder, spleen, mild diverticular changes, no bowel wall inflammation, appendix nl, no hernia, no other sig abnormalities  . LOWER EXTREMITY ANGIOGRAPHY Left 05/23/2017   Procedure: LOWER EXTREMITY ANGIOGRAPHY;  Surgeon: Algernon Huxley, MD;  Location: [facility];  Service: Cardiovascular;  Laterality: Left;  . LOWER EXTREMITY ANGIOGRAPHY Left 05/28/2017   Procedure: LOWER EXTREMITY ANGIOGRAPHY;  Surgeon: Algernon Huxley, MD;  Location: [facility];  Service: Cardiovascular;  Laterality: Left;  . LOWER EXTREMITY ANGIOGRAPHY Left 12/16/2018   Procedure: LOWER EXTREMITY ANGIOGRAPHY;  Surgeon: Algernon Huxley, MD;  Location: [place] [facility];  Service: Cardiovascular;  Laterality: Left;  . LOWER EXTREMITY ANGIOGRAPHY Right 12/23/2018   Procedure: LOWER EXTREMITY ANGIOGRAPHY;  Surgeon: Algernon Huxley, MD;  Location: [facility];  Service: Cardiovascular;  Laterality: Right;  . TEE WITHOUT CARDIOVERSION N/A 06/08/2014   Procedure: TRANSESOPHAGEAL ECHOCARDIOGRAM (TEE);  Surgeon: Grace Isaac, MD;  Location: [facility];  Service: Open Heart Surgery;  Laterality: N/A;  . TOE AMPUTATION Right 12/2011   rt middle toe  . US ECHOCARDIOGRAPHY  06/2014   EF 50-55%, HK of inf/post/inferolat walls, Ao sclerosis    Social History   Socioeconomic History  . Marital status: Widowed    Spouse name: Not on file  . Number of children: Not on file  . Years of education: Not on file  . Highest education level: Not on file  Occupational History  . Not on file  Tobacco Use  . Smoking status: Current Some Day Smoker    Packs/day: 0.50    Years: 40.00    Pack years: 20.00    Types: Cigarettes  . Smokeless tobacco: Never Used  . Tobacco comment: 1 every 3-4 days  Substance and Sexual Activity  . Alcohol use: No    Alcohol/week: 0.0 standard drinks  . Drug use: Not Currently    Comment: crack  . Sexual activity: Not on file  Other Topics Concern  . Not on file    Social History Narrative  . Not on file   Social Determinants of Health   Financial Resource Strain:   . Difficulty of Paying Living Expenses: Not on file  Food Insecurity:   . Worried About Charity fundraiser in the Last Year: Not on file  . Ran Out of Food in the Last Year: Not on file  Transportation Needs:   . Lack of Transportation (Medical): Not on file  . Lack of Transportation (Non-Medical): Not on file  Physical Activity:   . Days of Exercise per Week: Not on file  . Minutes of Exercise per Session: Not on file  Stress:   . Feeling of Stress : Not on file  Social Connections:   . Frequency of Communication with Friends and Family: Not on file  . Frequency of Social Gatherings with Friends and Family: Not on file  . Attends Religious Services: Not on file  . Active Member of Clubs or  Organizations: Not on file  . Attends Archivist Meetings: Not on file  . Marital Status: Not on file  Intimate Partner Violence:   . Fear of Current or Ex-Partner: Not on file  . Emotionally Abused: Not on file  . Physically Abused: Not on file  . Sexually Abused: Not on file    Family History  Problem Relation Age of Onset  . CAD Mother   . Hypertension Mother   . Diabetes Mother   . CAD Father   . Diabetes Father   . Sickle cell trait Other   . Asthma Other     No Known Allergies   Review of Systems   Review of Systems: Negative Unless Checked Constitutional: [] Weight loss  [] Fever  [] Chills Cardiac: [] Chest pain   []  Atrial Fibrillation  [] Palpitations   [] Shortness of breath when laying flat   [] Shortness of breath with exertion. [] Shortness of breath at rest Vascular:  [] Pain in legs with walking   [] Pain in legs with standing [] Pain in legs when laying flat   [x] Claudication    [] Pain in feet when laying flat    [] History of DVT   [] Phlebitis   [] Swelling in legs   [] Varicose veins   [] Non-healing ulcers Pulmonary:   [] Uses home oxygen   [] Productive cough    [] Hemoptysis   [] Wheeze  [] COPD   [] Asthma Neurologic:  [] Dizziness   [] Seizures  [] Blackouts [] History of stroke   [] History of TIA  [] Aphasia   [] Temporary Blindness   [] Weakness or numbness in arm   [x] Weakness or numbness in leg Musculoskeletal:   [] Joint swelling   [] Joint pain   [] Low back pain  []  History of Knee Replacement [] Arthritis [] back Surgeries  []  Spinal Stenosis    Hematologic:  [] Easy bruising  [] Easy bleeding   [] Hypercoagulable state   [] Anemic Gastrointestinal:  [] Diarrhea   [] Vomiting  [] Gastroesophageal reflux/heartburn   [] Difficulty swallowing. [] Abdominal pain Genitourinary:  [] Chronic kidney disease   [] Difficult urination  [] Anuric   [] Blood in urine [] Frequent urination  [] Burning with urination   [] Hematuria Skin:  [] Rashes   [x] Ulcers [] Wounds Psychological:  [] History of anxiety   []  History of major depression  []  Memory Difficulties      OBJECTIVE:   Physical Exam  BP 134/78 (BP Location: Right Arm)   Pulse 98   Resp 19   Ht 5\' 8"  (1.727 m)   Wt 297 lb (134.7 kg)   BMI 45.16 kg/m   Gen: WD/WN, NAD Head: [place]/AT, No temporalis wasting.  Ear/Nose/Throat: Hearing grossly intact, nares w/o erythema or drainage Eyes: PER, EOMI, sclera nonicteric.  Neck: Supple, no masses.  No JVD.  Pulmonary:  Good air movement, no use of accessory muscles.  Cardiac: RRR Vascular:  Vessel Right Left  Radial Palpable Palpable  Dorsalis Pedis Not Palpable Trace Palpable  Posterior Tibial Not Palpable Trace Palpable   Gastrointestinal: soft, non-distended. No guarding/no peritoneal signs.  Musculoskeletal: M/S 5/5 throughout.  No deformity or atrophy.  Neurologic: Pain and light touch intact in extremities.  Symmetrical.  Speech is fluent. Motor exam as listed above. Psychiatric: Judgment intact, Mood & affect appropriate for pt's clinical situation. Dermatologic: No Venous rashes. Left plantar great toe ulceration.Marland Kitchen  No changes consistent with cellulitis. Lymph : No  Cervical lymphadenopathy, no lichenification or skin changes of chronic lymphedema.       ASSESSMENT AND PLAN:  1. Atherosclerosis of native arteries of the extremities with ulceration [facility]) The patient has an ulceration on  the left plantar surface of great toe.  Per patient and wound care the wound is showing progression.  We will maintain close follow up to ensure the wound does not slow healing or deteriorate.  The patient will follow up in 3 months, sooner if the wound becomes worse.  - VAS [name] ABI WITH/WO TBI; Future - VAS [name] LOWER EXTREMITY ARTERIAL DUPLEX; Future  2. Essential hypertension Continue antihypertensive medications as already ordered, these medications have been reviewed and there are no changes at this time.   3. Tobacco abuse Smoking cessation was discussed, 3-10 minutes spent on this topic specifically   4. Type 2 diabetes mellitus with other circulatory complication, with long-term current use of insulin (HCC) Continue hypoglycemic medications as already ordered, these medications have been reviewed and there are no changes at this time.  Hgb A1C to be monitored as already arranged by primary service    Current Outpatient Medications on File Prior to Visit  Medication Sig Dispense Refill  . amLODipine (NORVASC) 5 MG tablet     . aspirin 81 MG EC tablet Take 1 tablet (81 mg total) by mouth daily. 30 tablet 0  . atorvastatin (LIPITOR) 80 MG tablet TAKE 1 TABLET EVERY DAY  AT  6  PM 90 tablet 1  . cholecalciferol (VITAMIN D3) 25 MCG (1000 UT) tablet Take 5,000 Units by mouth daily.    . clopidogrel (PLAVIX) 75 MG tablet Take 1 tablet (75 mg total) by mouth daily. 30 tablet 0  . DULoxetine (CYMBALTA) 60 MG capsule     . furosemide (LASIX) 20 MG tablet TAKE 1 TABLET (20 MG TOTAL) BY MOUTH DAILY AS NEEDED. 90 tablet 2  . insulin aspart (NOVOLOG) 100 UNIT/ML injection Inject 3 Units into the skin 3 (three) times daily with meals. (Patient taking differently: Inject 6  Units into the skin 3 (three) times daily with meals. ) 2.7 mL 0  . insulin glargine (LANTUS) 100 UNIT/ML injection Inject 0.6 mLs (60 Units total) into the skin 2 (two) times daily. (Patient taking differently: Inject 70 Units into the skin 2 (two) times daily. )    . lidocaine (LIDODERM) 5 %     . lisinopril (PRINIVIL,ZESTRIL) 5 MG tablet Take 1 tablet (5 mg total) by mouth daily. 30 tablet 0  . metoprolol tartrate (LOPRESSOR) 25 MG tablet Take 0.5 tablets (12.5 mg total) by mouth 2 (two) times daily. (BETA BLOCKER) 180 tablet 3  . oxybutynin (DITROPAN) 5 MG tablet Take 5 mg by mouth 2 (two) times daily.     . potassium chloride SA (K-DUR) 20 MEQ tablet TAKE 1 TABLET (20 MEQ TOTAL) BY MOUTH DAILY AS NEEDED. 90 tablet 0  . pregabalin (LYRICA) 50 MG capsule 50 mg Three (3) times a day.    Marland Kitchen tiZANidine (ZANAFLEX) 4 MG tablet Take 4 mg by mouth 3 (three) times daily.    Marland Kitchen VICTOZA 18 MG/3ML SOPN     . gabapentin (NEURONTIN) 100 MG capsule Take 5 capsules (500 mg total) by mouth 3 (three) times daily. (Patient not taking: Reported on 04/30/2019)    . Multiple Vitamin (MULTIVITAMIN) tablet Take 1 tablet by mouth daily.    . naproxen (NAPROSYN) 500 MG tablet Take 500 mg by mouth 2 (two) times daily.    . varenicline (CHANTIX CONTINUING MONTH PAK) 1 MG tablet Take 1 tablet (1 mg total) by mouth 2 (two) times daily. (Patient not taking: Reported on 04/30/2019) 180 tablet 0   No current facility-administered medications on  file prior to visit.    There are no Patient Instructions on file for this visit. No follow-ups on file.   Kris Hartmann, NP  This note was completed with Sales executive.  Any errors are purely unintentional.

## 2019-05-06 ENCOUNTER — Other Ambulatory Visit: Payer: Self-pay

## 2019-05-06 ENCOUNTER — Encounter: Payer: Medicare HMO | Attending: Physician Assistant | Admitting: Physician Assistant

## 2019-05-06 DIAGNOSIS — Z794 Long term (current) use of insulin: Secondary | ICD-10-CM | POA: Diagnosis not present

## 2019-05-06 DIAGNOSIS — E785 Hyperlipidemia, unspecified: Secondary | ICD-10-CM | POA: Diagnosis not present

## 2019-05-06 DIAGNOSIS — I10 Essential (primary) hypertension: Secondary | ICD-10-CM | POA: Diagnosis not present

## 2019-05-06 DIAGNOSIS — M1990 Unspecified osteoarthritis, unspecified site: Secondary | ICD-10-CM | POA: Insufficient documentation

## 2019-05-06 DIAGNOSIS — I255 Ischemic cardiomyopathy: Secondary | ICD-10-CM | POA: Diagnosis not present

## 2019-05-06 DIAGNOSIS — L97519 Non-pressure chronic ulcer of other part of right foot with unspecified severity: Secondary | ICD-10-CM | POA: Insufficient documentation

## 2019-05-06 DIAGNOSIS — Z951 Presence of aortocoronary bypass graft: Secondary | ICD-10-CM | POA: Insufficient documentation

## 2019-05-06 DIAGNOSIS — E11621 Type 2 diabetes mellitus with foot ulcer: Secondary | ICD-10-CM | POA: Diagnosis present

## 2019-05-06 DIAGNOSIS — Z8249 Family history of ischemic heart disease and other diseases of the circulatory system: Secondary | ICD-10-CM | POA: Insufficient documentation

## 2019-05-06 DIAGNOSIS — E669 Obesity, unspecified: Secondary | ICD-10-CM | POA: Diagnosis not present

## 2019-05-06 DIAGNOSIS — I2510 Atherosclerotic heart disease of native coronary artery without angina pectoris: Secondary | ICD-10-CM | POA: Insufficient documentation

## 2019-05-06 DIAGNOSIS — E1151 Type 2 diabetes mellitus with diabetic peripheral angiopathy without gangrene: Secondary | ICD-10-CM | POA: Diagnosis not present

## 2019-05-06 DIAGNOSIS — L97522 Non-pressure chronic ulcer of other part of left foot with fat layer exposed: Secondary | ICD-10-CM | POA: Diagnosis not present

## 2019-05-06 DIAGNOSIS — E1142 Type 2 diabetes mellitus with diabetic polyneuropathy: Secondary | ICD-10-CM | POA: Diagnosis not present

## 2019-05-06 DIAGNOSIS — F17200 Nicotine dependence, unspecified, uncomplicated: Secondary | ICD-10-CM | POA: Insufficient documentation

## 2019-05-06 DIAGNOSIS — Z833 Family history of diabetes mellitus: Secondary | ICD-10-CM | POA: Insufficient documentation

## 2019-05-06 DIAGNOSIS — I252 Old myocardial infarction: Secondary | ICD-10-CM | POA: Insufficient documentation

## 2019-05-06 NOTE — Progress Notes (Signed)
Melinda, [name] ([id number]) Visit Report for 05/06/2019 Arrival Information Details Patient Name: Melinda [name], Melinda [name] Date of Service: 05/06/2019 10:45 AM Medical Record Number: [id number] Patient Account Number: [id number] Date of Birth/Sex: [birth year] (64 y.o. F) Treating RN: [name] Primary Care Neema Fluegge: [name] Other Clinician: Referring Holden Maniscalco: [name] Treating Dionysios Massman/Extender: [name], [name]: 9 Visit Information History Since Last Visit Added or deleted any medications: No Patient Arrived: Ambulatory Any new allergies or adverse reactions: No Arrival Time: 10:41 Had a fall or experienced change in No Accompanied By: self activities of daily living that may affect Transfer Assistance: None risk of falls: Patient Identification Verified: Yes Signs or symptoms of abuse/neglect since last visito No Secondary Verification Process Yes Hospitalized since last visit: No Completed: Implantable device outside of the clinic excluding No Patient Has Alerts: Yes cellular tissue based products placed in the center Patient Alerts: Patient on Blood since last visit: Thinner Has Dressing in Place as Prescribed: Yes Plavix, Aspirin 81mg  Pain Present Now: No ABI's L .88/R .71 Electronic Signature(s) Signed: 05/06/2019 3:01:49 PM By: Lorine Bears RCP, RRT, CHT Entered By: Lorine Bears on 05/06/2019 10:42:17 Melinda [name] ([id number]) -------------------------------------------------------------------------------- Encounter Discharge Information Details Patient Name: Melinda [name] Date of Service: 05/06/2019 10:45 AM Medical Record Number: [id number] Patient Account Number: [id number] Date of Birth/Sex: 1955-10-31 (64 y.o. F) Treating RN: [name] Primary Care Samin Milke: [name] Other Clinician: Referring Kyarra Vancamp: [name] Treating Kemiah Booz/Extender: [name], [name]: 9 Encounter  Discharge Information Items Post Procedure Vitals Discharge Condition: Stable Temperature (F): 99.6 Ambulatory Status: Ambulatory Pulse (bpm): 93 Discharge Destination: Home Respiratory Rate (breaths/min): 16 Transportation: Private Auto Blood Pressure (mmHg): 128/51 Accompanied By: self Schedule Follow-up Appointment: Yes Clinical Summary of Care: Electronic Signature(s) Signed: 05/06/2019 4:56:10 PM By: [name] Entered By: [name] on 05/06/2019 11:14:55 Melinda [name] ([id number]) -------------------------------------------------------------------------------- Lower Extremity Assessment Details Patient Name: Melinda [name] Date of Service: 05/06/2019 10:45 AM Medical Record Number: [id number] Patient Account Number: [id number] Date of Birth/Sex: 09-28-1955 (64 y.o. F) Treating RN: Army Melia Primary Care Marleigh Kaylor: [name] Other Clinician: Referring Daleah Coulson: [name] Treating Anberlin Diez/Extender: [name], [name]: 9 Edema Assessment Assessed: [Left: No] [Right: No] Edema: [Left: N] [Right: o] Vascular Assessment Pulses: Dorsalis Pedis Palpable: [Left:Yes] Electronic Signature(s) Signed: 05/06/2019 4:03:57 PM By: Army Melia Entered By: Army Melia on 05/06/2019 10:52:08 Melinda [name] ([id number]) -------------------------------------------------------------------------------- Multi Wound Chart Details Patient Name: Melinda [name] Date of Service: 05/06/2019 10:45 AM Medical Record Number: [id number] Patient Account Number: [id number] Date of Birth/Sex: 08-26-1955 (64 y.o. F) Treating RN: [name] Primary Care Joh Rao: [name] Other Clinician: Referring Jourdan Maldonado: [name] Treating Dietra Stokely/Extender: [name], [name]: 9 Vital Signs Height(in): 68 Pulse(bpm): 93 Weight(lbs): 300 Blood Pressure(mmHg): 128/51 Body Mass Index(BMI): 46 Temperature(F): 99.6 Respiratory  Rate 16 (breaths/min): Photos: [N/A:N/A] Wound Location: Left Toe Great - Plantar N/A N/A Wounding Event: Gradually Appeared N/A N/A Primary Etiology: Diabetic Wound/Ulcer of the N/A N/A Lower Extremity Comorbid History: Cataracts, Coronary Artery N/A N/A Disease, Hypertension, Myocardial Infarction, Peripheral Venous Disease, Type II Diabetes, Osteoarthritis, Osteomyelitis, Neuropathy Date Acquired: 11/30/2018 N/A N/A Weeks of Treatment: 9 N/A N/A Wound Status: Open N/A N/A Measurements L x W x D 0.9x1.2x0.3 N/A N/A (cm) Area (cm) : 0.848 N/A N/A Volume (cm) : 0.254 N/A N/A % Reduction in Area: 72.90% N/A N/A % Reduction in Volume: 83.80% N/A N/A Classification: Grade 2 N/A N/A  Exudate Amount: Medium N/A N/A Exudate Type: Serosanguineous N/A N/A Exudate Color: red, brown N/A N/A Wound Margin: Epibole N/A N/A Granulation Amount: Medium (34-66%) N/A N/A Granulation Quality: Pale N/A N/A Necrotic Amount: Medium (34-66%) N/A N/A Exposed Structures: N/A N/A TYAUNA, EDMUNDS ([id number]) Fat Layer (Subcutaneous Tissue) Exposed: Yes Fascia: No Tendon: No Muscle: No Joint: No Bone: No Epithelialization: None N/A N/A Treatment Notes Electronic Signature(s) Signed: 05/06/2019 4:56:10 PM By: [name] Entered By: [name] on 05/06/2019 11:07:25 Melinda [name] ([id number]) -------------------------------------------------------------------------------- Multi-Disciplinary Care Plan Details Patient Name: Melinda [name] Date of Service: 05/06/2019 10:45 AM Medical Record Number: [id number] Patient Account Number: [id number] Date of Birth/Sex: 07/26/1955 (64 y.o. F) Treating RN: [name] Primary Care Melodie Ashworth: [name] Other Clinician: Referring Rheanna Sergent: [name] Treating Chardonay Scritchfield/Extender: [name], [name]: 9 Active Inactive Medication Nursing Diagnoses: Knowledge deficit related to medication safety: actual or  potential Goals: Patient/caregiver will demonstrate understanding of all current medications Date Initiated: 02/26/2019 Target Resolution Date: 02/26/2019 Goal Status: Active Interventions: Assess for medication contraindications each visit where new medications are prescribed Notes: Necrotic Tissue Nursing Diagnoses: Impaired tissue integrity related to necrotic/devitalized tissue Goals: Necrotic/devitalized tissue will be minimized in the wound bed Date Initiated: 02/26/2019 Target Resolution Date: 03/06/2019 Goal Status: Active Interventions: Assess patient pain level pre-, during and post procedure and prior to discharge Treatment Activities: Apply topical anesthetic as ordered : 02/26/2019 Notes: Orientation to the Wound Care Program Nursing Diagnoses: Knowledge deficit related to the wound healing center program Goals: Patient/caregiver will verbalize understanding of the [facility] Date Initiated: 02/26/2019 Target Resolution Date: 02/26/2019 Goal Status: Active KEYAWNA, LENNEN ([id number]) Interventions: Provide education on orientation to the wound center Notes: Pressure Nursing Diagnoses: Knowledge deficit related to causes and risk factors for pressure ulcer development Knowledge deficit related to management of pressures ulcers Goals: Patient will remain free from development of additional pressure ulcers Date Initiated: 02/26/2019 Target Resolution Date: 02/26/2019 Goal Status: Active Patient will remain free of pressure ulcers Date Initiated: 02/26/2019 Target Resolution Date: 02/26/2019 Goal Status: Active Patient/caregiver will verbalize understanding of pressure ulcer management Date Initiated: 02/26/2019 Target Resolution Date: 02/26/2019 Goal Status: Active Interventions: Assess: immobility, friction, shearing, incontinence upon admission and as needed Notes: Wound/Skin Impairment Nursing Diagnoses: Impaired tissue  integrity Goals: Ulcer/skin breakdown will have a volume reduction of 30% by week 4 Date Initiated: 02/26/2019 Target Resolution Date: 03/29/2019 Goal Status: Active Interventions: Provide education on ulcer and skin care Treatment Activities: Topical wound management initiated : 02/26/2019 Notes: Electronic Signature(s) Signed: 05/06/2019 4:56:10 PM By: [name] Entered By: [name] on 05/06/2019 11:06:20 Melinda [name] ([id number]) -------------------------------------------------------------------------------- Pain Assessment Details Patient Name: Melinda [name] Date of Service: 05/06/2019 10:45 AM Medical Record Number: [id number] Patient Account Number: [id number] Date of Birth/Sex: [birth year] (64 y.o. F) Treating RN: [name] Primary Care Felicidad Sugarman: [name] Other Clinician: Referring Eileen Croswell: [name] Treating Katrianna Friesenhahn/Extender: [name], [name]: 9 Active Problems Location of Pain Severity and Description of Pain Patient Has Paino No Site Locations Pain Management and Medication Current Pain Management: Electronic Signature(s) Signed: 05/06/2019 3:01:49 PM By: Paulla Fore, RRT, CHT Signed: 05/06/2019 4:56:10 PM By: [name] Entered By: Lorine Bears on 05/06/2019 10:43:01 Melinda [name] ([id number]) -------------------------------------------------------------------------------- Patient/Caregiver Education Details Patient Name: Melinda [name] Date of Service: 05/06/2019 10:45 AM Medical Record Number: [id number] Patient Account Number: [id number] Date of Birth/Gender: 1955/05/31 (64 y.o. F) Treating RN: [name] Primary  Care Physician: [name] Other Clinician: Referring Physician: [name] Treating Physician/Extender: Sharalyn Ink in Treatment: 9 Education Assessment Education Provided To: Patient Education Topics Provided Wound/Skin  Impairment: Handouts: Other: wound care as ordered Methods: Demonstration, Explain/Verbal Responses: State content correctly Electronic Signature(s) Signed: 05/06/2019 4:56:10 PM By: [name] Entered By: [name] on 05/06/2019 11:14:13 Melinda [name] ([id number]) -------------------------------------------------------------------------------- Wound Assessment Details Patient Name: Melinda [name] Date of Service: 05/06/2019 10:45 AM Medical Record Number: [id number] Patient Account Number: [id number] Date of Birth/Sex: April 05, 1956 (64 y.o. F) Treating RN: Army Melia Primary Care Elsi Stelzer: [name] Other Clinician: Referring Jamiah Recore: [name] Treating Amori Colomb/Extender: [name], [name]: 9 Wound Status Wound Number: 1 Primary Diabetic Wound/Ulcer of the Lower Extremity Etiology: Wound Location: Left Toe Great - Plantar Wound Open Wounding Event: Gradually Appeared Status: Date Acquired: 11/30/2018 Comorbid Cataracts, Coronary Artery Disease, Weeks Of Treatment: 9 History: Hypertension, Myocardial Infarction, Peripheral Clustered Wound: No Venous Disease, Type II Diabetes, Osteoarthritis, Osteomyelitis, Neuropathy Photos Wound Measurements Length: (cm) 0.9 % Reduction in Width: (cm) 1.2 % Reduction in Depth: (cm) 0.3 Epithelializat Area: (cm) 0.848 Tunneling: Volume: (cm) 0.254 Undermining: Area: 72.9% Volume: 83.8% ion: None No No Wound Description Classification: Grade 2 Foul Odor Aft Wound Margin: Epibole Slough/Fibrin Exudate Amount: Medium Exudate Type: Serosanguineous Exudate Color: red, brown er Cleansing: No o Yes Wound Bed Granulation Amount: Medium (34-66%) Exposed Structure Granulation Quality: Pale Fascia Exposed: No Necrotic Amount: Medium (34-66%) Fat Layer (Subcutaneous Tissue) Exposed: Yes Necrotic Quality: Adherent Slough Tendon Exposed: No Muscle Exposed: No Joint Exposed: No Bone Exposed:  No [place], [place]. ([id number]) Treatment Notes Wound #1 (Left, Plantar Toe Great) Notes silvercel, Foam, conform to foot Electronic Signature(s) Signed: 05/06/2019 4:03:57 PM By: Army Melia Entered By: Army Melia on 05/06/2019 10:49:45 Melinda [name] ([id number]) -------------------------------------------------------------------------------- Vitals Details Patient Name: Melinda [name] Date of Service: 05/06/2019 10:45 AM Medical Record Number: [id number] Patient Account Number: [id number] Date of Birth/Sex: 23-Sep-1955 (64 y.o. F) Treating RN: [name] Primary Care Rishi Vicario: [name] Other Clinician: Referring Dallis Darden: [name] Treating Jenesa Foresta/Extender: [name], [name]: 9 Vital Signs Time Taken: 10:40 Temperature (F): 99.6 Height (in): 68 Pulse (bpm): 93 Weight (lbs): 300 Respiratory Rate (breaths/min): 16 Body Mass Index (BMI): 45.6 Blood Pressure (mmHg): 128/51 Reference Range: 80 - 120 mg / dl Electronic Signature(s) Signed: 05/06/2019 3:01:49 PM By: Lorine Bears RCP, RRT, CHT Entered By: Lorine Bears on 05/06/2019 10:43:52

## 2019-05-06 NOTE — Progress Notes (Addendum)
AKSHA, THIER ([id number]) Visit Report for 05/06/2019 Chief Complaint Document Details Patient Name: KENIYA, GAETZ. Date of Service: 05/06/2019 10:45 AM Medical Record Number: [id number] Patient Account Number: [id number] Date of Birth/Sex: 1956-01-24 (64 y.o. F) Treating RN: [name] Primary Care Provider: [name] Other Clinician: Referring Provider: [name] Treating Provider/Extender: [name], Kofi Murrell Weeks in Treatment: 9 Information Obtained from: Patient Chief Complaint Left great toe ulcer and left LE skin tear Electronic Signature(s) Signed: 05/06/2019 10:47:06 AM By: [name] [name] Entered By: [name] on 05/06/2019 10:47:05 [name] ([id number]) -------------------------------------------------------------------------------- Debridement Details Patient Name: [name]. Date of Service: 05/06/2019 10:45 AM Medical Record Number: [id number] Patient Account Number: [id number] Date of Birth/Sex: 07/21/1955 (64 y.o. F) Treating RN: [name] Primary Care Provider: [name] Other Clinician: Referring Provider: [name] Treating Provider/Extender: [name], Siah Kannan Weeks in Treatment: 9 Debridement Performed for Wound #1 Left,Plantar Toe Great Assessment: Performed By: Physician [name], Dmetrius Ambs E., [name] Debridement Type: Debridement Severity of Tissue Pre Fat layer exposed Debridement: Level of Consciousness (Pre- Awake and Alert procedure): Pre-procedure Verification/Time Yes - 11:07 Out Taken: Start Time: 11:07 Pain Control: Lidocaine 4% Topical Solution Total Area Debrided (L x W): 0.9 (cm) x 1.2 (cm) = 1.08 (cm) Tissue and other material Viable, Non-Viable, Callus, Slough, Subcutaneous, Slough debrided: Level: Skin/Subcutaneous Tissue Debridement Description: Excisional Instrument: Curette Bleeding: Minimum Hemostasis Achieved: Pressure End Time: 11:12 Procedural Pain: 0 Post Procedural Pain: 0 Response to  Treatment: Procedure was tolerated well Level of Consciousness Awake and Alert (Post-procedure): Post Debridement Measurements of Total Wound Length: (cm) 0.9 Width: (cm) 1.2 Depth: (cm) 0.2 Volume: (cm) 0.17 Character of Wound/Ulcer Post Debridement: Improved Severity of Tissue Post Debridement: Fat layer exposed Post Procedure Diagnosis Same as Pre-procedure Electronic Signature(s) Signed: 05/06/2019 4:56:10 PM By: [name] Signed: 05/07/2019 6:44:22 PM By: [name] [name] Entered By: [name] on 05/06/2019 11:13:14 [name] ([id number]) -------------------------------------------------------------------------------- HPI Details Patient Name: [name]. Date of Service: 05/06/2019 10:45 AM Medical Record Number: [id number] Patient Account Number: [id number] Date of Birth/Sex: 03/24/1956 (64 y.o. F) Treating RN: [name] Primary Care Provider: [name] Other Clinician: Referring Provider: [name] Treating Provider/Extender: [name], Myrka Sylva Weeks in Treatment: 9 History of Present Illness HPI Description: ADMISSION 02/26/2019 Patient is a 64 year old type II diabetic on insulin with significant polyneuropathy. She has been followed by Dr. Sherren Mocha cline of podiatry for problems related to her feet dating back to the early part of 2019 as I can review in [place] link. 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 Subsequently she was seen by Dr. Lucky Cowboy of vascular surgery and had stents x2 placed in her left SFA as well as left SFA angioplasties on 05/20/2017. She was noted to have a wound on her left foot in October 2019. In August 2020 on 8/24 she underwent a right anterior tibial artery angioplasty a right tibioperoneal trunk angioplasty and a right SFA angioplasty. The patient states that she developed a left great toe wound in August which is at the base of her previous partial amputation in this  area. She tells [name] that she has had a right great toe wound since December 2019 and she has been using Santyl to both of these areas that she received from a fellow parishioner at her church. By enlarge she has been using Neosporin to these areas and not offloading them specifically Arterial studies on  9/22 showed an ABI on the right of 0.71 with triphasic waveforms on the left at 0.88 with triphasic and biphasic waveforms. TBI's on the right and 0.44 and on the left at 1.05. 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 11//20; x-rays I did last week were both negative for osteomyelitis. She has a fairly large wound at the base of her left first toe and a small punched out area on the right first toe. We use silver alginate last week 03/13/2019 upon evaluation today patient appears to be doing okay with regard to her wounds at this point. She does have some callus buildup noted upon evaluation at this point. Fortunately there is no evidence of active infection which is also good news. I am going to have to perform some debridement to clear away some of the necrotic tissue today. 03/25/2019 on evaluation today patient appears to be doing well with regard to her foot ulcers. She has been tolerating the dressing changes without complication. Fortunately there is no signs of active infection at this time. Her left foot ulcer actually seems to be doing excellent no debridement even necessary today I am good have to perform some debridement on the right great toe. 04/08/19 on evaluation today patient actually appears to be doing well with regard to her wounds. In fact on the right this appears to be completely healed on the left this is measuring smaller although there still like callous around the edges of the wound. Fortunately there's no  evidence of active infection at this time there is some hyper granulation. 04/15/2019 on evaluation today patient actually appears to be doing well with regard to her toe ulcer. This seems to be showing signs of excellent granulation there is minimal slough/biofilm on the surface of the wound. She does have a significant amount of callus around the edges of the wound but at the same time I feel like that this is something we can easily pared down without any complication. Fortunately there is no evidence of active infection at this point. No fevers, chills, nausea, vomiting, or diarrhea. 04/22/2019 on evaluation today patient appears to be doing somewhat better in regard to her wound. She has been tolerating the dressing changes without complication. There is some callus noted at this point this can require some sharp debridement which I discussed with the patient as well. We will go ahead and proceed with debridement today to try to clear away some of this necrotic callus as well as clean off the biofilm/slough from the surface of the wound. RYCHELLE, MUTCHLER ([id number]) 12/29-Patient returns at 1 week with regards to her left plantar foot wound which seems to be doing well, the callus was debrided around the wound the last time and seems to be doing much better since. Apparently it standing smaller, patient is a little discomforted by having to come every week to the clinic but she agrees to do that 05/06/19 on evaluation today patient actually appears to be doing well overall with regard to her plantar foot ulcer. She does have some callous buildup today but nonetheless this does not appear to be showing any signs of active infection at this time which is great news. The base of the wound does seem to be much healthier than what it was last time I saw her. No fevers, chills, nausea, or vomiting noted at this time.  Electronic Signature(s) Signed: 05/09/2019 10:27:55 AM By: [name]  [name] Previous Signature: 05/07/2019 6:44:22 PM Version By: [name] [name] Entered By: [name] on 05/09/2019 10:27:55 [name] ([id number]) -------------------------------------------------------------------------------- Physical Exam Details Patient Name: [name] Date of Service: 05/06/2019 10:45 AM Medical Record Number: [id number] Patient Account Number: [id number] Date of Birth/Sex: Oct 16, 1955 (64 y.o. F) Treating RN: [name] Primary Care Provider: [name] Other Clinician: Referring Provider: [name] Treating Provider/Extender: [name], Sherill Mangen Weeks in Treatment: 9 Constitutional Well-nourished and well-hydrated in no acute distress. Respiratory normal breathing without difficulty. Psychiatric this patient is able to make decisions and demonstrates good insight into disease process. Alert and Oriented x 3. pleasant and cooperative. Notes Patient's wound bed currently did require sharp debridement I will move callous as well as some Slough about phone from the surface of the wound. Post debridement wound bed in general appears to be doing much better the edges of the wound are reattaching more appropriately and in general though this is going slowly I feel like we're heading in the correct direction. Electronic Signature(s) Signed: 05/07/2019 6:44:22 PM By: [name] [name] Entered By: [name] on 05/07/2019 02:23:29 [name] ([id number]) -------------------------------------------------------------------------------- Physician Orders Details Patient Name: [name] Date of Service: 05/06/2019 10:45 AM Medical Record Number: [id number] Patient Account Number: [id number] Date of Birth/Sex: 23-Sep-1955 (64 y.o. F) Treating RN: [name] Primary Care Provider: [name] Other Clinician: Referring Provider: [name] Treating Provider/Extender: [name], Lilibeth Opie Weeks in Treatment: 9 Verbal / Phone Orders:  No Diagnosis Coding ICD-10 Coding Code Description E11.621 Type 2 diabetes mellitus with foot ulcer E11.51 Type 2 diabetes mellitus with diabetic peripheral angiopathy without gangrene 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sed S81.802A Unspecified open wound, left lower leg, initial encounter E11.42 Type 2 diabetes mellitus with diabetic polyneuropathy Wound Cleansing Wound #1 Left,Plantar Toe Great o Cleanse wound with mild soap and water o No tub bath. Anesthetic (add to Medication List) Wound #1 Left,Plantar Toe Great o Topical Lidocaine 4% cream applied to wound bed prior to debridement (In Clinic Only). Primary Wound Dressing Wound #1 Left,Plantar Toe Great o Silver Alginate Secondary Dressing Wound #1 Left,Plantar Toe Great o Gauze and Kerlix/Conform o Foam Dressing Change Frequency Wound #1 Left,Plantar Toe Great o Change dressing every other day. Follow-up Appointments o Return Appointment in 1 week. Edema Control Wound #1 Left,Plantar Toe Great o Elevate legs to the level of the heart and pump ankles as often as possible Off-Loading Wound #1 Left,Plantar Toe Great o Open toe surgical shoe with peg assist. MEILAH, HEMPEL E. ([id number]) Additional Orders / Instructions o Stop Smoking o Increase protein intake. Electronic Signature(s) Signed: 05/06/2019 4:56:10 PM By: [name] Signed: 05/07/2019 6:44:22 PM By: [name] [name] Entered By: [name] on 05/06/2019 11:13:51 [name] ([id number]) -------------------------------------------------------------------------------- Problem List Details Patient Name: SHAQUONDA, STELMA. Date of Service: 05/06/2019 10:45 AM Medical Record Number: [id number] Patient Account Number: [id number] Date of Birth/Sex: 08/21/1955 (64 y.o. F) Treating RN: [name] Primary Care Provider: [name] Other Clinician: Referring Provider: [name] Treating Provider/Extender: [name], Willaim Mode Weeks in Treatment: 9 Active Problems ICD-10 Evaluated Encounter Code Description Active Date Today Diagnosis E11.621 Type 2 diabetes mellitus with foot ulcer 02/26/2019 No Yes E11.51 Type 2 diabetes mellitus with diabetic peripheral angiopathy 02/26/2019 No Yes without gangrene L97.522 Non-pressure chronic ulcer of other part of left foot  with fat 02/26/2019 No Yes layer exposed S81.802A Unspecified open wound, left lower leg, initial encounter 04/15/2019 No Yes E11.42 Type 2 diabetes mellitus with diabetic polyneuropathy 02/26/2019 No Yes Inactive Problems Resolved Problems ICD-10 Code Description Active Date Resolved Date L97.512 Non-pressure chronic ulcer of other part of right foot with fat layer 02/26/2019 02/26/2019 exposed Electronic Signature(s) Signed: 05/06/2019 10:47:00 AM By: [name] [name] Entered By: [name] on 05/06/2019 10:47:00 [name] ([id number]) -------------------------------------------------------------------------------- Progress Note Details Patient Name: [name]. Date of Service: 05/06/2019 10:45 AM Medical Record Number: [id number] Patient Account Number: [id number] Date of Birth/Sex: 1956/02/04 (64 y.o. F) Treating RN: [name] Primary Care Provider: [name] Other Clinician: Referring Provider: [name] Treating Provider/Extender: [name], Elverta Dimiceli Weeks in Treatment: 9 Subjective Chief Complaint Information obtained from Patient Left great toe ulcer and left LE skin tear History of Present Illness (HPI) ADMISSION 02/26/2019 Patient is a 64 year old type II diabetic on insulin with significant polyneuropathy. She has been followed by Dr. Sherren Mocha cline of podiatry for problems related to her feet dating back to the early part of 2019 as I can review in [place] link. 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  Subsequently she was seen by Dr. Lucky Cowboy of vascular surgery and had stents x2 placed in her left SFA as well as left SFA angioplasties on 05/20/2017. She was noted to have a wound on her left foot in October 2019. In August 2020 on 8/24 she underwent a right anterior tibial artery angioplasty a right tibioperoneal trunk angioplasty and a right SFA angioplasty. The patient states that she developed a left great toe wound in August which is at the base of her previous partial amputation in this area. She tells [name] that she has had a right great toe wound since December 2019 and she has been using Santyl to both of these areas that she received from a fellow parishioner at her church. By enlarge she has been using Neosporin to these areas and not offloading them specifically Arterial studies on 9/22 showed an ABI on the right of 0.71 with triphasic waveforms on the left at 0.88 with triphasic and biphasic waveforms. TBI's on the right and 0.44 and on the left at 1.05. 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 11//20; x-rays I did last week were both negative for osteomyelitis. She has a fairly large wound at the base of her left first toe and a small punched out area on the right first toe. We use silver alginate last week 03/13/2019 upon evaluation today patient appears to be doing okay with regard to her wounds at this point. She does have some callus buildup noted upon evaluation at this point. Fortunately there is no evidence of active infection which is also good news. I am going to have to perform some debridement to clear away some of the necrotic tissue today. 03/25/2019 on evaluation today patient appears to be doing well with regard to her foot ulcers. She has been tolerating the dressing changes without complication. Fortunately  there is no signs of active infection at this time. Her left foot ulcer actually seems to be doing excellent no debridement even necessary today I am good have to perform some debridement on the right great toe. 04/08/19 on evaluation today patient actually appears to  be doing well with regard to her wounds. In fact on the right this appears to be completely healed on the left this is measuring smaller although there still like callous around the edges of the wound. Fortunately there's no evidence of active infection at this time there is some hyper granulation. 04/15/2019 on evaluation today patient actually appears to be doing well with regard to her toe ulcer. This seems to be showing signs of excellent granulation there is minimal slough/biofilm on the surface of the wound. She does have a significant amount of callus around the edges of the wound but at the same time I feel like that this is something we can easily pared down without any complication. Fortunately there is no evidence of active infection at this point. No fevers, chills, nausea, vomiting, or diarrhea. DELORA, DEMANCHE ([id number]) 04/22/2019 on evaluation today patient appears to be doing somewhat better in regard to her wound. She has been tolerating the dressing changes without complication. There is some callus noted at this point this can require some sharp debridement which I discussed with the patient as well. We will go ahead and proceed with debridement today to try to clear away some of this necrotic callus as well as clean off the biofilm/slough from the surface of the wound. 12/29-Patient returns at 1 week with regards to her left plantar foot wound which seems to be doing well, the callus was debrided around the wound the last time and seems to be doing much better since. Apparently it standing smaller, patient is a little discomforted by having to come every week to the clinic but she agrees to do that 05/06/19 on  evaluation today patient actually appears to be doing well overall with regard to her plantar foot ulcer. She does have some callous buildup today but nonetheless this does not appear to be showing any signs of active infection at this time which is great news. The base of the wound does seem to be much healthier than what it was last time I saw her. No fevers, chills, nausea, or vomiting noted at this time. Patient History Information obtained from Patient. Family History Diabetes - Mother,Father, Heart Disease - Mother,Father, Hypertension - Mother, No family history of Cancer, Hereditary Spherocytosis, Kidney Disease, Lung Disease, Seizures, Stroke, Thyroid Problems, Tuberculosis. Social History Current every day smoker - 49 years, Alcohol Use - Never, Drug Use - No History, Caffeine Use - Daily. Medical History Eyes Patient has history of Cataracts Denies history of Glaucoma Ear/Nose/Mouth/Throat Denies history of Chronic sinus problems/congestion, Middle ear problems Hematologic/Lymphatic Denies history of Anemia, Hemophilia, Human Immunodeficiency Virus, Lymphedema, Sickle Cell Disease Respiratory Denies history of Aspiration, Asthma, Chronic Obstructive Pulmonary Disease (COPD), Pneumothorax, Sleep Apnea, Tuberculosis Cardiovascular Patient has history of Coronary Artery Disease, Hypertension, Myocardial Infarction, Peripheral Venous Disease Denies history of Angina, Arrhythmia, Congestive Heart Failure, Deep Vein Thrombosis, Hypotension, Phlebitis, Vasculitis Gastrointestinal Denies history of Cirrhosis , Colitis, Crohn s, Hepatitis A, Hepatitis B, Hepatitis C Endocrine Patient has history of Type II Diabetes - IDDM Denies history of Type I Diabetes Genitourinary Denies history of End Stage Renal Disease Immunological Denies history of Lupus Erythematosus, Raynaud s, Scleroderma Integumentary (Skin) Denies history of History of Burn, History of pressure  wounds Musculoskeletal Patient has history of Osteoarthritis - knee bilateral, Osteomyelitis Neurologic Patient has history of Neuropathy - peripheral Denies history of Dementia, Quadriplegia, Paraplegia, Seizure Disorder Medical And Surgical History Notes Cardiovascular KENZLIE, CERA. ([id number]) ischemic cardiomyopathy, hyperlipidemia Review of Systems (ROS)  Constitutional Symptoms (General Health) Denies complaints or symptoms of Fatigue, Fever, Chills, Marked Weight Change. Respiratory Denies complaints or symptoms of Chronic or frequent coughs, Shortness of Breath. Cardiovascular Denies complaints or symptoms of Chest pain, LE edema. Psychiatric Denies complaints or symptoms of Anxiety, Claustrophobia. Objective Constitutional Well-nourished and well-hydrated in no acute distress. Vitals Time Taken: 10:40 AM, Height: 68 in, Weight: 300 lbs, BMI: 45.6, Temperature: 99.6 F, Pulse: 93 bpm, Respiratory Rate: 16 breaths/min, Blood Pressure: 128/51 mmHg. Respiratory normal breathing without difficulty. Psychiatric this patient is able to make decisions and demonstrates good insight into disease process. Alert and Oriented x 3. pleasant and cooperative. General Notes: Patient's wound bed currently did require sharp debridement I will move callous as well as some Slough about phone from the surface of the wound. Post debridement wound bed in general appears to be doing much better the edges of the wound are reattaching more appropriately and in general though this is going slowly I feel like we're heading in the correct direction. Integumentary (Hair, Skin) Wound #1 status is Open. Original cause of wound was Gradually Appeared. The wound is located on the [employer]. The wound measures 0.9cm length x 1.2cm width x 0.3cm depth; 0.848cm^2 area and 0.254cm^3 volume. There is Fat Layer (Subcutaneous Tissue) Exposed exposed. There is no tunneling or undermining noted.  There is a medium amount of serosanguineous drainage noted. The wound margin is epibole. There is medium (34-66%) pale granulation within the wound bed. There is a medium (34-66%) amount of necrotic tissue within the wound bed including Adherent Slough. Assessment Active Problems ICD-10 Type 2 diabetes mellitus with foot ulcer Rocchio, Lyn E. ([id number]) Type 2 diabetes mellitus with diabetic peripheral angiopathy without gangrene Non-pressure chronic ulcer of other part of left foot with fat layer exposed Unspecified open wound, left lower leg, initial encounter Type 2 diabetes mellitus with diabetic polyneuropathy Procedures Wound #1 Pre-procedure diagnosis of Wound #1 is a Diabetic Wound/Ulcer of the Lower Extremity located on the Left,Plantar Toe Great .Severity of Tissue Pre Debridement is: Fat layer exposed. There was a Excisional Skin/Subcutaneous Tissue Debridement with a total area of 1.08 sq cm performed by [name], Janene Yousuf E., [name]. With the following instrument(s): Curette to remove Viable and Non-Viable tissue/material. Material removed includes Callus, Subcutaneous Tissue, and Slough after achieving pain control using Lidocaine 4% Topical Solution. No specimens were taken. A time out was conducted at 11:07, prior to the start of the procedure. A Minimum amount of bleeding was controlled with Pressure. The procedure was tolerated well with a pain level of 0 throughout and a pain level of 0 following the procedure. Post Debridement Measurements: 0.9cm length x 1.2cm width x 0.2cm depth; 0.17cm^3 volume. Character of Wound/Ulcer Post Debridement is improved. Severity of Tissue Post Debridement is: Fat layer exposed. Post procedure Diagnosis Wound #1: Same as Pre-Procedure Plan Wound Cleansing: Wound #1 Left,Plantar Toe Great: Cleanse wound with mild soap and water No tub bath. Anesthetic (add to Medication List): Wound #1 Left,Plantar Toe Great: Topical Lidocaine 4% cream  applied to wound bed prior to debridement (In Clinic Only). Primary Wound Dressing: Wound #1 Left,Plantar Toe Great: Silver Alginate Secondary Dressing: Wound #1 Left,Plantar Toe Great: Gauze and Kerlix/Conform Foam Dressing Change Frequency: Wound #1 Left,Plantar Toe Great: Change dressing every other day. Follow-up Appointments: Return Appointment in 1 week. Edema Control: Wound #1 Left,Plantar Toe Great: Elevate legs to the level of the heart and pump ankles as often as possible Off-Loading: Wound #1 Left,Plantar  Toe Great: Open toe surgical shoe with peg assist. Additional Orders / Instructions: MEREDITH, PYLES ([id number]) Stop Smoking Increase protein intake. I'm in a recommend currently that we go ahead and continue with the wound care measures which include the silver object dressing since that seems to be managing her drainage much better. It's keeping things from being so macerated. We will cover the area with phone and then Kerlex in order to secure and pad the region. I'm gonna recommend as well the patient continued where the open toe surgical she with peg assist which I think is gonna be beneficial for her. Please see above for specific wound care orders. We will see patient for re-evaluation in 1 week(s) here in the clinic. If anything worsens or changes patient will contact our office for additional recommendations. Electronic Signature(s) Signed: 05/09/2019 10:28:06 AM By: [name] [name] Previous Signature: 05/07/2019 6:44:22 PM Version By: [name] [name] Entered By: [name] on 05/09/2019 10:28:06 [name] ([id number]) -------------------------------------------------------------------------------- ROS/PFSH Details Patient Name: [name]. Date of Service: 05/06/2019 10:45 AM Medical Record Number: [id number] Patient Account Number: [id number] Date of Birth/Sex: Mar 17, 1956 (64 y.o. F) Treating RN: [name] Primary Care  Provider: [name] Other Clinician: Referring Provider: [name] Treating Provider/Extender: [name], Rickayla Wieland Weeks in Treatment: 9 Information Obtained From Patient Constitutional Symptoms (General Health) Complaints and Symptoms: Negative for: Fatigue; Fever; Chills; Marked Weight Change Respiratory Complaints and Symptoms: Negative for: Chronic or frequent coughs; Shortness of Breath Medical History: Negative for: Aspiration; Asthma; Chronic Obstructive Pulmonary Disease (COPD); Pneumothorax; Sleep Apnea; Tuberculosis Cardiovascular Complaints and Symptoms: Negative for: Chest pain; LE edema Medical History: Positive for: Coronary Artery Disease; Hypertension; Myocardial Infarction; Peripheral Venous Disease Negative for: Angina; Arrhythmia; Congestive Heart Failure; Deep Vein Thrombosis; Hypotension; Phlebitis; Vasculitis Past Medical History Notes: ischemic cardiomyopathy, hyperlipidemia Psychiatric Complaints and Symptoms: Negative for: Anxiety; Claustrophobia Eyes Medical History: Positive for: Cataracts Negative for: Glaucoma Ear/Nose/Mouth/Throat Medical History: Negative for: Chronic sinus problems/congestion; Middle ear problems Hematologic/Lymphatic Medical History: Negative for: Anemia; Hemophilia; Human Immunodeficiency Virus; Lymphedema; Sickle Cell Disease Gastrointestinal MAYONA, YOUNT ([id number]) Medical History: Negative for: Cirrhosis ; Colitis; Crohnos; Hepatitis A; Hepatitis B; Hepatitis C Endocrine Medical History: Positive for: Type II Diabetes - IDDM Negative for: Type I Diabetes Time with diabetes: 34 years Treated with: Insulin Blood sugar tested every day: Yes Tested : Blood sugar testing results: Breakfast: 82 Genitourinary Medical History: Negative for: End Stage Renal Disease Immunological Medical History: Negative for: Lupus Erythematosus; Raynaudos; Scleroderma Integumentary (Skin) Medical History: Negative for:  History of Burn; History of pressure wounds Musculoskeletal Medical History: Positive for: Osteoarthritis - knee bilateral; Osteomyelitis Neurologic Medical History: Positive for: Neuropathy - peripheral Negative for: Dementia; Quadriplegia; Paraplegia; Seizure Disorder HBO Extended History Items Eyes: Cataracts Immunizations Pneumococcal Vaccine: Received Pneumococcal Vaccination: No Implantable Devices None Family and Social History Cancer: No; Diabetes: Yes - Mother,Father; Heart Disease: Yes - Mother,Father; Hereditary Spherocytosis: No; Hypertension: Yes - Mother; Kidney Disease: No; Lung Disease: No; Seizures: No; Stroke: No; Thyroid Problems: No; Tuberculosis: No; Current every day smoker - 49 years; Alcohol Use: Never; Drug Use: No History; Caffeine Use: Daily; Financial Concerns: No; Food, Clothing or Shelter Needs: No; Support System Lacking: No; Transportation Concerns: No Physician Affirmation LARESA, CRAMBLIT ([id number]) I have reviewed and agree with the above information. Electronic Signature(s) Signed: 05/07/2019 6:44:22 PM By: [name] [name] Signed: 05/08/2019 4:40:20 PM By: [name] Entered By: [name] on 05/07/2019 02:22:52 Hefferan,  Reanne EMarland Kitchen ([id number]) -------------------------------------------------------------------------------- SuperBill Details Patient Name: DANNIELL, MORVANT. Date of Service: 05/06/2019 Medical Record Number: [id number] Patient Account Number: [id number] Date of Birth/Sex: [birth year] (64 y.o. F) Treating RN: [name] Primary Care Provider: [name] Other Clinician: Referring Provider: [name] Treating Provider/Extender: [name], Earsel Shouse Weeks in Treatment: 9 Diagnosis Coding ICD-10 Codes Code Description E11.621 Type 2 diabetes mellitus with foot ulcer E11.51 Type 2 diabetes mellitus with diabetic peripheral angiopathy without gangrene L97.522 Non-pressure chronic ulcer of other part of left foot with fat  layer exposed S81.802A Unspecified open wound, left lower leg, initial encounter E11.42 Type 2 diabetes mellitus with diabetic polyneuropathy Facility Procedures CPT4 Code: [id number] Description: [id number] - DEB SUBQ TISSUE 20 SQ CM/< ICD-10 Diagnosis Description L97.522 Non-pressure chronic ulcer of other part of left foot with fat Modifier: layer exposed Quantity: 1 Physician Procedures CPT4 Code: [id number] Description: 11042 - WC PHYS SUBQ TISS 20 SQ CM ICD-10 Diagnosis Description L97.522 Non-pressure chronic ulcer of other part of left foot with fat Modifier: layer exposed Quantity: 1 Electronic Signature(s) Signed: 05/07/2019 6:44:22 PM By: [name] [name] Entered By: [name] on 05/07/2019 02:24:58

## 2019-05-13 ENCOUNTER — Other Ambulatory Visit: Payer: Self-pay

## 2019-05-13 ENCOUNTER — Encounter: Payer: Medicare HMO | Admitting: Physician Assistant

## 2019-05-13 DIAGNOSIS — E11621 Type 2 diabetes mellitus with foot ulcer: Secondary | ICD-10-CM | POA: Diagnosis not present

## 2019-05-13 NOTE — Progress Notes (Addendum)
Melinda, [name] ([id number]) Visit Report for 05/13/2019 Chief Complaint Document Details Patient Name: Melinda, [name] Date of Service: 05/13/2019 10:45 AM Medical Record Number: [id number] Patient Account Number: [id number] Date of Birth/Sex: 1955-05-03 (64 y.o. F) Treating RN: [name] Primary Care Provider: [name] Other Clinician: Referring Provider: [name] Treating Provider/Extender: [name], Chou Busler Weeks in Treatment: 10 Information Obtained from: Patient Chief Complaint Left great toe ulcer and left LE skin tear Electronic Signature(s) Signed: 05/13/2019 10:52:21 AM By: [name] [name] Entered By: [name] on 05/13/2019 10:52:20 Melinda [name] ([id number]) -------------------------------------------------------------------------------- HPI Details Patient Name: Melinda [name] Date of Service: 05/13/2019 10:45 AM Medical Record Number: [id number] Patient Account Number: [id number] Date of Birth/Sex: 09/15/1955 (64 y.o. F) Treating RN: [name] Primary Care Provider: [name] Other Clinician: Referring Provider: [name] Treating Provider/Extender: [name], Thu Baggett Weeks in Treatment: 10 History of Present Illness HPI Description: ADMISSION 02/26/2019 Patient is a 64 year old type II diabetic on insulin with significant polyneuropathy. She has been followed by Dr. Sherren Mocha cline of podiatry for problems related to her feet dating back to the early part of 2019 as I can review in [place] link. 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 Subsequently she was seen by Dr. Lucky Cowboy of vascular surgery and had stents x2 placed in her left SFA as well as left SFA angioplasties on 05/20/2017. She was noted to have a wound on her left foot in October 2019. In August 2020 on 8/24 she underwent a right anterior tibial artery angioplasty a right tibioperoneal trunk angioplasty and a right SFA  angioplasty. The patient states that she developed a left great toe wound in August which is at the base of her previous partial amputation in this area. She tells [name] that she has had a right great toe wound since December 2019 and she has been using Santyl to both of these areas that she received from a fellow parishioner at her church. By enlarge she has been using Neosporin to these areas and not offloading them specifically Arterial studies on 9/22 showed an ABI on the right of 0.71 with triphasic waveforms on the left at 0.88 with triphasic and biphasic waveforms. TBI's on the right and 0.44 and on the left at 1.05. 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 11//20; x-rays I did last week were both negative for osteomyelitis. She has a fairly large wound at the base of her left first toe and a small punched out area on the right first toe. We use silver alginate last week 03/13/2019 upon evaluation today patient appears to be doing okay with regard to her wounds at this point. She does have some callus buildup noted upon evaluation at this point. Fortunately there is no evidence of active infection which is also good news. I am going to have to perform some debridement to clear away some of the necrotic tissue today. 03/25/2019 on evaluation today patient appears to be doing well with regard to her foot ulcers. She has been tolerating the dressing changes without complication. Fortunately there is no signs of active infection at this time. Her left foot ulcer actually seems to be doing excellent no debridement even necessary today I am good have to perform some debridement on the right great toe. 04/08/19 on evaluation today patient  actually appears to be doing well with regard to her wounds. In fact on the right this appears to be  completely healed on the left this is measuring smaller although there still like callous around the edges of the wound. Fortunately there's no evidence of active infection at this time there is some hyper granulation. 04/15/2019 on evaluation today patient actually appears to be doing well with regard to her toe ulcer. This seems to be showing signs of excellent granulation there is minimal slough/biofilm on the surface of the wound. She does have a significant amount of callus around the edges of the wound but at the same time I feel like that this is something we can easily pared down without any complication. Fortunately there is no evidence of active infection at this point. No fevers, chills, nausea, vomiting, or diarrhea. 04/22/2019 on evaluation today patient appears to be doing somewhat better in regard to her wound. She has been tolerating the dressing changes without complication. There is some callus noted at this point this can require some sharp debridement which I discussed with the patient as well. We will go ahead and proceed with debridement today to try to clear away some of this necrotic callus as well as clean off the biofilm/slough from the surface of the wound. Melinda [name], Melinda [name] ([id number]) 12/29-Patient returns at 1 week with regards to her left plantar foot wound which seems to be doing well, the callus was debrided around the wound the last time and seems to be doing much better since. Apparently it standing smaller, patient is a little discomforted by having to come every week to the clinic but she agrees to do that 05/06/19 on evaluation today patient actually appears to be doing well overall with regard to her plantar foot ulcer. She does have some callous buildup today but nonetheless this does not appear to be showing any signs of active infection at this time which is great news. The base of the wound does seem to be much healthier than what it was last time I saw her.  No fevers, chills, nausea, or vomiting noted at this time. 05/13/2019 on evaluation today patient appears to be doing well with regard to her plantar foot ulcer. She has been tolerating the dressing changes without complication. In fact I am not even sure there is anything that is going require sharp debridement at this point today which is also good news. Fortunately there is no signs of active infection at this time. No fevers, chills, nausea, vomiting, or diarrhea. Electronic Signature(s) Signed: 05/14/2019 10:38:46 AM By: [name] [name] Previous Signature: 05/13/2019 2:51:14 PM Version By: [name] [name] Entered By: [name] on 05/14/2019 10:38:46 Melinda [name] ([id number]) -------------------------------------------------------------------------------- Physical Exam Details Patient Name: Melinda [name] Date of Service: 05/13/2019 10:45 AM Medical Record Number: [id number] Patient Account Number: [id number] Date of Birth/Sex: 07/09/1955 (64 y.o. F) Treating RN: [name] Primary Care Provider: [name] Other Clinician: Referring Provider: [name] Treating Provider/Extender: [name], Jmichael Gille Weeks in Treatment: 10 Constitutional Well-nourished and well-hydrated in no acute distress. Respiratory normal breathing without difficulty. Psychiatric this patient is able to make decisions and demonstrates good insight into disease process. Alert and Oriented x 3. pleasant and cooperative. Notes Patient's wound bed actually showed signs of good granulation there was minimal slough buildup I was able to mechanically clear this away with just saline and gauze and subsequently the patient also exhibiting signs of very little callus buildup which  is great news overall I think that we need to perform any debridement today. Electronic Signature(s) Signed: 05/13/2019 2:55:02 PM By: [name] [name] Entered By: [name] on 05/13/2019 [place],  [place]. ([id number]) -------------------------------------------------------------------------------- Physician Orders Details Patient Name: Melinda [name] Date of Service: 05/13/2019 10:45 AM Medical Record Number: [id number] Patient Account Number: [id number] Date of Birth/Sex: Sep 26, 1955 (64 y.o. F) Treating RN: [name] Primary Care Provider: [name] Other Clinician: Referring Provider: [name] Treating Provider/Extender: [name], Tatem Holsonback Weeks in Treatment: 10 Verbal / Phone Orders: No Diagnosis Coding ICD-10 Coding Code Description E11.621 Type 2 diabetes mellitus with foot ulcer E11.51 Type 2 diabetes mellitus with diabetic peripheral angiopathy without gangrene 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sed S81.802A Unspecified open wound, left lower leg, initial encounter E11.42 Type 2 diabetes mellitus with diabetic polyneuropathy Wound Cleansing Wound #1 Left,Plantar Toe Great o Cleanse wound with mild soap and water o No tub bath. Anesthetic (add to Medication List) Wound #1 Left,Plantar Toe Great o Topical Lidocaine 4% cream applied to wound bed prior to debridement (In Clinic Only). Primary Wound Dressing Wound #1 Left,Plantar Toe Great o Silver Alginate Secondary Dressing Wound #1 Left,Plantar Toe Great o Gauze and Kerlix/Conform o Foam Dressing Change Frequency Wound #1 Left,Plantar Toe Great o Change dressing every other day. Follow-up Appointments o Return Appointment in 1 week. Edema Control Wound #1 Left,Plantar Toe Great o Elevate legs to the level of the heart and pump ankles as often as possible Off-Loading Wound #1 Left,Plantar Toe Great o Open toe surgical shoe with peg assist. Melinda [name], Melinda E. ([id number]) Additional Orders / Instructions o Stop Smoking o Increase protein intake. Electronic Signature(s) Signed: 05/13/2019 4:01:12 PM By: [name] [name] Signed:  05/13/2019 4:09:10 PM By: [name] Entered By: [name] on 05/13/2019 11:05:05 Melinda [name] ([id number]) -------------------------------------------------------------------------------- Problem List Details Patient Name: Melinda [name], Melinda [name] Date of Service: 05/13/2019 10:45 AM Medical Record Number: [id number] Patient Account Number: [id number] Date of Birth/Sex: 03-26-1956 (64 y.o. F) Treating RN: [name] Primary Care Provider: [name] Other Clinician: Referring Provider: [name] Treating Provider/Extender: [name], Nizhoni Parlow Weeks in Treatment: 10 Active Problems ICD-10 Evaluated Encounter Code Description Active Date Today Diagnosis E11.621 Type 2 diabetes mellitus with foot ulcer 02/26/2019 No Yes E11.51 Type 2 diabetes mellitus with diabetic peripheral angiopathy 02/26/2019 No Yes without gangrene L97.522 Non-pressure chronic ulcer of other part of left foot with fat 02/26/2019 No Yes layer exposed S81.802A Unspecified open wound, left lower leg, initial encounter 04/15/2019 No Yes E11.42 Type 2 diabetes mellitus with diabetic polyneuropathy 02/26/2019 No Yes Inactive Problems Resolved Problems ICD-10 Code Description Active Date Resolved Date L97.512 Non-pressure chronic ulcer of other part of right foot with fat layer 02/26/2019 02/26/2019 exposed Electronic Signature(s) Signed: 05/13/2019 10:52:13 AM By: [name] [name] Entered By: [name] on 05/13/2019 10:52:13 Melinda [name] ([id number]) -------------------------------------------------------------------------------- Progress Note Details Patient Name: Melinda [name] Date of Service: 05/13/2019 10:45 AM Medical Record Number: [id number] Patient Account Number: [id number] Date of Birth/Sex: [birth year] (64 y.o. F) Treating RN: [name] Primary Care Provider: [name] Other Clinician: Referring Provider: [name] Treating Provider/Extender: [name], Mell Guia Weeks in  Treatment: 10 Subjective Chief Complaint Information obtained from Patient Left great toe ulcer and left LE skin tear History of Present Illness (HPI) ADMISSION 02/26/2019 Patient is a 64 year old type II diabetic on insulin with significant polyneuropathy. She has been followed by  Dr. Sherren Mocha cline of podiatry for problems related to her feet dating back to the early part of 2019 as I can review in [place] link. 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 Subsequently she was seen by Dr. Lucky Cowboy of vascular surgery and had stents x2 placed in her left SFA as well as left SFA angioplasties on 05/20/2017. She was noted to have a wound on her left foot in October 2019. In August 2020 on 8/24 she underwent a right anterior tibial artery angioplasty a right tibioperoneal trunk angioplasty and a right SFA angioplasty. The patient states that she developed a left great toe wound in August which is at the base of her previous partial amputation in this area. She tells [name] that she has had a right great toe wound since December 2019 and she has been using Santyl to both of these areas that she received from a fellow parishioner at her church. By enlarge she has been using Neosporin to these areas and not offloading them specifically Arterial studies on 9/22 showed an ABI on the right of 0.71 with triphasic waveforms on the left at 0.88 with triphasic and biphasic waveforms. TBI's on the right and 0.44 and on the left at 1.05. 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 11//20; x-rays I did last week were both negative for osteomyelitis. She has a fairly large wound at the base of her left first toe and a small punched out area on the right first toe. We use silver alginate last week 03/13/2019 upon  evaluation today patient appears to be doing okay with regard to her wounds at this point. She does have some callus buildup noted upon evaluation at this point. Fortunately there is no evidence of active infection which is also good news. I am going to have to perform some debridement to clear away some of the necrotic tissue today. 03/25/2019 on evaluation today patient appears to be doing well with regard to her foot ulcers. She has been tolerating the dressing changes without complication. Fortunately there is no signs of active infection at this time. Her left foot ulcer actually seems to be doing excellent no debridement even necessary today I am good have to perform some debridement on the right great toe. 04/08/19 on evaluation today patient actually appears to be doing well with regard to her wounds. In fact on the right this appears to be completely healed on the left this is measuring smaller although there still like callous around the edges of the wound. Fortunately there's no evidence of active infection at this time there is some hyper granulation. 04/15/2019 on evaluation today patient actually appears to be doing well with regard to her toe ulcer. This seems to be showing signs of excellent granulation there is minimal slough/biofilm on the surface of the wound. She does have a significant amount of callus around the edges of the wound but at the same time I feel like that this is something we can easily pared down without any complication. Fortunately there is no evidence of active infection at this point. No fevers, chills, nausea, vomiting, or diarrhea. Melinda [name], Melinda [name] ([id number]) 04/22/2019 on evaluation today patient appears to be doing somewhat better in regard to her wound. She has been tolerating the dressing changes without complication. There is some callus noted at  this point this can require some sharp debridement which I discussed with the patient as well. We will go  ahead and proceed with debridement today to try to clear away some of this necrotic callus as well as clean off the biofilm/slough from the surface of the wound. 12/29-Patient returns at 1 week with regards to her left plantar foot wound which seems to be doing well, the callus was debrided around the wound the last time and seems to be doing much better since. Apparently it standing smaller, patient is a little discomforted by having to come every week to the clinic but she agrees to do that 05/06/19 on evaluation today patient actually appears to be doing well overall with regard to her plantar foot ulcer. She does have some callous buildup today but nonetheless this does not appear to be showing any signs of active infection at this time which is great news. The base of the wound does seem to be much healthier than what it was last time I saw her. No fevers, chills, nausea, or vomiting noted at this time. 05/13/2019 on evaluation today patient appears to be doing well with regard to her plantar foot ulcer. She has been tolerating the dressing changes without complication. In fact I am not even sure there is anything that is going require sharp debridement at this point today which is also good news. Fortunately there is no signs of active infection at this time. No fevers, chills, nausea, vomiting, or diarrhea. Objective Constitutional Well-nourished and well-hydrated in no acute distress. Vitals Time Taken: 10:45 AM, Height: 68 in, Weight: 300 lbs, BMI: 45.6, Temperature: 98.7 F, Pulse: 90 bpm, Respiratory Rate: 16 breaths/min, Blood Pressure: 126/62 mmHg. Respiratory normal breathing without difficulty. Psychiatric this patient is able to make decisions and demonstrates good insight into disease process. Alert and Oriented x 3. pleasant and cooperative. General Notes: Patient's wound bed actually showed signs of good granulation there was minimal slough buildup I was able to mechanically  clear this away with just saline and gauze and subsequently the patient also exhibiting signs of very little callus buildup which is great news overall I think that we need to perform any debridement today. Integumentary (Hair, Skin) Wound #1 status is Open. Original cause of wound was Gradually Appeared. The wound is located on the [employer]. The wound measures 1cm length x 0.7cm width x 0.2cm depth; 0.55cm^2 area and 0.11cm^3 volume. There is Fat Layer (Subcutaneous Tissue) Exposed exposed. There is no tunneling or undermining noted. There is a medium amount of serosanguineous drainage noted. The wound margin is epibole. There is medium (34-66%) pale granulation within the wound bed. There is a medium (34-66%) amount of necrotic tissue within the wound bed including Adherent Slough. Melinda [name], Melinda [name] ([id number]) Assessment Active Problems ICD-10 Type 2 diabetes mellitus with foot ulcer Type 2 diabetes mellitus with diabetic peripheral angiopathy without gangrene Non-pressure chronic ulcer of other part of left foot with fat layer exposed Unspecified open wound, left lower leg, initial encounter Type 2 diabetes mellitus with diabetic polyneuropathy Plan Wound Cleansing: Wound #1 Left,Plantar Toe Great: Cleanse wound with mild soap and water No tub bath. Anesthetic (add to Medication List): Wound #1 Left,Plantar Toe Great: Topical Lidocaine 4% cream applied to wound bed prior to debridement (In Clinic Only). Primary Wound Dressing: Wound #1 Left,Plantar Toe Great: Silver Alginate Secondary Dressing: Wound #1 Left,Plantar Toe Great: Gauze and Kerlix/Conform Foam Dressing Change Frequency: Wound #1 Left,Plantar Toe Great: Change dressing every other  day. Follow-up Appointments: Return Appointment in 1 week. Edema Control: Wound #1 Left,Plantar Toe Great: Elevate legs to the level of the heart and pump ankles as often as possible Off-Loading: Wound #1 Left,Plantar  Toe Great: Open toe surgical shoe with peg assist. Additional Orders / Instructions: Stop Smoking Increase protein intake. 1. My suggestion at this time is can be that we go ahead and continue with the current wound care measures which includes the silver alginate dressing I feel like this is beneficial for the patient. 2. I would recommend as well that we continue with the gauze along with Kerlix and con form along with foam in order to pad the area she seems to be doing very well with this. She will work so continue with the PEG assist offloading shoe. We will see patient back for reevaluation in 1 week here in the clinic. If anything worsens or changes patient will contact our office for additional recommendations. Melinda [name], Melinda [name] ([id number]) Electronic Signature(s) Signed: 05/14/2019 10:38:58 AM By: [name] [name] Previous Signature: 05/13/2019 2:55:49 PM Version By: [name] [name] Entered By: [name] on 05/14/2019 10:38:58 Melinda [name] ([id number]) -------------------------------------------------------------------------------- SuperBill Details Patient Name: Melinda [name] Date of Service: 05/13/2019 Medical Record Number: [id number] Patient Account Number: [id number] Date of Birth/Sex: 1955/11/11 (64 y.o. F) Treating RN: [name] Primary Care Provider: [name] Other Clinician: Referring Provider: [name] Treating Provider/Extender: [name], Nevaeha Finerty Weeks in Treatment: 10 Diagnosis Coding ICD-10 Codes Code Description E11.621 Type 2 diabetes mellitus with foot ulcer E11.51 Type 2 diabetes mellitus with diabetic peripheral angiopathy without gangrene 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sed S81.802A Unspecified open wound, left lower leg, initial encounter E11.42 Type 2 diabetes mellitus with diabetic polyneuropathy Facility Procedures CPT4 Code: [id number] Description: 99213 - WOUND CARE  VISIT-LEV 3 EST PT Modifier: Quantity: 1 Physician Procedures CPT4 Code Description: [id number] 99213 - WC PHYS LEVEL 3 - EST PT ICD-10 Diagnosis Description E11.621 Type 2 diabetes mellitus with foot ulcer E11.51 Type 2 diabetes mellitus with diabetic peripheral angiopathy wi L97.522 Non-pressure chronic ulcer of  other part of left foot with fat S81.802A Unspecified open wound, left lower leg, initial encounter Modifier: thout gangren layer exposed Quantity: 1 e Electronic Signature(s) Signed: 05/13/2019 2:56:09 PM By: [name] [name] Entered By: [name] on 05/13/2019 14:56:08

## 2019-05-14 NOTE — Progress Notes (Signed)
Melinda, [name] ([id number]) Visit Report for 05/13/2019 Arrival Information Details Patient Name: Melinda [name], Melinda [name] Date of Service: 05/13/2019 10:45 AM Medical Record Number: [id number] Patient Account Number: [id number] Date of Birth/Sex: Nov 08, 1955 (64 y.o. F) Treating RN: [name] Primary Care Dahl Higinbotham: [name] Other Clinician: Referring Kyarra Vancamp: [name] Treating Sabrinna Yearwood/Extender: [name], [name]: 10 Visit Information History Since Last Visit Added or deleted any medications: No Patient Arrived: Ambulatory Any new allergies or adverse reactions: No Arrival Time: 10:45 Had a fall or experienced change in No Accompanied By: self activities of daily living that may affect Transfer Assistance: None risk of falls: Patient Identification Verified: Yes Signs or symptoms of abuse/neglect since last visito No Secondary Verification Process Yes Hospitalized since last visit: No Completed: Implantable device outside of the clinic excluding No Patient Has Alerts: Yes cellular tissue based products placed in the center Patient Alerts: Patient on Blood since last visit: Thinner Has Dressing in Place as Prescribed: Yes Plavix, Aspirin 81mg  Pain Present Now: No ABI's L .88/R .71 Electronic Signature(s) Signed: 05/13/2019 4:10:34 PM By: Lorine Bears RCP, RRT, CHT Entered By: Lorine Bears on 05/13/2019 10:46:06 Melinda [name] ([id number]) -------------------------------------------------------------------------------- Clinic Level of Care Assessment Details Patient Name: Melinda [name] Date of Service: 05/13/2019 10:45 AM Medical Record Number: [id number] Patient Account Number: [id number] Date of Birth/Sex: 20-Jun-1955 (64 y.o. F) Treating RN: [name] Primary Care Loney Peto: [name] Other Clinician: Referring Jatavian Calica: [name] Treating Edwina Grossberg/Extender: [name], [name]: 10 Clinic  Level of Care Assessment Items TOOL 4 Quantity Score []  - Use when only an EandM is performed on FOLLOW-UP visit 0 ASSESSMENTS - Nursing Assessment / Reassessment X - Reassessment of Co-morbidities (includes updates in patient status) 1 10 X- 1 5 Reassessment of Adherence to Treatment Plan ASSESSMENTS - Wound and Skin Assessment / Reassessment X - Simple Wound Assessment / Reassessment - one wound 1 5 []  - 0 Complex Wound Assessment / Reassessment - multiple wounds []  - 0 Dermatologic / Skin Assessment (not related to wound area) ASSESSMENTS - Focused Assessment []  - Circumferential Edema Measurements - multi extremities 0 []  - 0 Nutritional Assessment / Counseling / Intervention X- 1 5 Lower Extremity Assessment (monofilament, tuning fork, pulses) []  - 0 Peripheral Arterial Disease Assessment (using hand held doppler) ASSESSMENTS - Ostomy and/or Continence Assessment and Care []  - Incontinence Assessment and Management 0 []  - 0 Ostomy Care Assessment and Management (repouching, etc.) PROCESS - Coordination of Care X - Simple Patient / Family Education for ongoing care 1 15 []  - 0 Complex (extensive) Patient / Family Education for ongoing care X- 1 10 Staff obtains Programmer, systems, Records, Test Results / Process Orders []  - 0 Staff telephones HHA, Nursing Homes / Clarify orders / etc []  - 0 Routine Transfer to another Facility (non-emergent condition) []  - 0 Routine Hospital Admission (non-emergent condition) []  - 0 New Admissions / Biomedical engineer / Ordering NPWT, Apligraf, etc. []  - 0 Emergency Hospital Admission (emergent condition) X- 1 10 Simple Discharge Coordination TAWONA, DEVER. ([id number]) []  - 0 Complex (extensive) Discharge Coordination PROCESS - Special Needs []  - Pediatric / Minor Patient Management 0 []  - 0 Isolation Patient Management []  - 0 Hearing / Language / Visual special needs []  - 0 Assessment of Community assistance (transportation,  D/C planning, etc.) []  - 0 Additional assistance / Altered mentation []  - 0 Support Surface(s) Assessment (bed, cushion, seat, etc.) INTERVENTIONS - Wound Cleansing / Measurement X -  Simple Wound Cleansing - one wound 1 5 []  - 0 Complex Wound Cleansing - multiple wounds X- 1 5 Wound Imaging (photographs - any number of wounds) []  - 0 Wound Tracing (instead of photographs) X- 1 5 Simple Wound Measurement - one wound []  - 0 Complex Wound Measurement - multiple wounds INTERVENTIONS - Wound Dressings X - Small Wound Dressing one or multiple wounds 1 10 []  - 0 Medium Wound Dressing one or multiple wounds []  - 0 Large Wound Dressing one or multiple wounds []  - 0 Application of Medications - topical []  - 0 Application of Medications - injection INTERVENTIONS - Miscellaneous []  - External ear exam 0 []  - 0 Specimen Collection (cultures, biopsies, blood, body fluids, etc.) []  - 0 Specimen(s) / Culture(s) sent or taken to Lab for analysis []  - 0 Patient Transfer (multiple staff / Civil Service fast streamer / Similar devices) []  - 0 Simple Staple / Suture removal (25 or less) []  - 0 Complex Staple / Suture removal (26 or more) []  - 0 Hypo / Hyperglycemic Management (close monitor of Blood Glucose) []  - 0 Ankle / Brachial Index (ABI) - do not check if billed separately X- 1 5 Vital Signs Kolbe, Rashanda E. ([id number]) Has the patient been seen at the hospital within the last three years: Yes Total Score: 90 Level Of Care: New/Established - Level 3 Electronic Signature(s) Signed: 05/13/2019 4:09:10 PM By: [name] Entered By: [name] on 05/13/2019 11:05:29 Melinda [name] ([id number]) -------------------------------------------------------------------------------- Encounter Discharge Information Details Patient Name: Melinda [name] Date of Service: 05/13/2019 10:45 AM Medical Record Number: [id number] Patient Account Number: [id number] Date of Birth/Sex: [birth year] (64 y.o.  F) Treating RN: [name] Primary Care Shirlette Scarber: [name] Other Clinician: Referring Hedda Crumbley: [name] Treating Michaeleen Down/Extender: [name], [name]: 10 Encounter Discharge Information Items Discharge Condition: Stable Ambulatory Status: Ambulatory Discharge Destination: Home Transportation: Private Auto Accompanied By: self Schedule Follow-up Appointment: Yes Clinical Summary of Care: Electronic Signature(s) Signed: 05/13/2019 4:09:10 PM By: [name] Entered By: [name] on 05/13/2019 11:06:41 Melinda [name] ([id number]) -------------------------------------------------------------------------------- Lower Extremity Assessment Details Patient Name: Melinda [name] Date of Service: 05/13/2019 10:45 AM Medical Record Number: [id number] Patient Account Number: [id number] Date of Birth/Sex: 1955-09-09 (64 y.o. F) Treating RN: [name] Primary Care Isak Sotomayor: [name] Other Clinician: Referring Chailyn Racette: [name] Treating Jaythan Hinely/Extender: [name], [name]: 10 Edema Assessment Assessed: [Left: No] [Right: No] Edema: [Left: Ye] [Right: s] Vascular Assessment Pulses: Dorsalis Pedis Palpable: [Left:Yes] Electronic Signature(s) Signed: 05/14/2019 4:52:26 PM By: [name], [name], RN, [name], [name] RN, [name] Entered By: [name], [name], RN, [name], [name] on 05/13/2019 10:53:11 Melinda [name] ([id number]) -------------------------------------------------------------------------------- Multi Wound Chart Details Patient Name: Melinda [name] Date of Service: 05/13/2019 10:45 AM Medical Record Number: [id number] Patient Account Number: [id number] Date of Birth/Sex: 1955-06-30 (64 y.o. F) Treating RN: [name] Primary Care Philo Kurtz: [name] Other Clinician: Referring Tayloranne Lekas: [name] Treating Laparis Durrett/Extender: [name], [name]: 10 Vital Signs Height(in): 68 Pulse(bpm): 90 Weight(lbs): 300 Blood  Pressure(mmHg): 126/62 Body Mass Index(BMI): 46 Temperature(F): 98.7 Respiratory Rate 16 (breaths/min): Photos: [N/A:N/A] Wound Location: Left Toe Great - Plantar N/A N/A Wounding Event: Gradually Appeared N/A N/A Primary Etiology: Diabetic Wound/Ulcer of the N/A N/A Lower Extremity Comorbid History: Cataracts, Coronary Artery N/A N/A Disease, Hypertension, Myocardial Infarction, Peripheral Venous Disease, Type II Diabetes, Osteoarthritis, Osteomyelitis, Neuropathy Date Acquired: 11/30/2018 N/A N/A Weeks of Treatment: 10 N/A N/A Wound Status: Open N/A N/A Measurements L  x W x D 1x0.7x0.2 N/A N/A (cm) Area (cm) : 0.55 N/A N/A Volume (cm) : 0.11 N/A N/A % Reduction in Area: 82.50% N/A N/A % Reduction in Volume: 93.00% N/A N/A Classification: Grade 2 N/A N/A Exudate Amount: Medium N/A N/A Exudate Type: Serosanguineous N/A N/A Exudate Color: red, brown N/A N/A Wound Margin: Epibole N/A N/A Granulation Amount: Medium (34-66%) N/A N/A Granulation Quality: Pale N/A N/A Necrotic Amount: Medium (34-66%) N/A N/A Exposed Structures: N/A N/A ANIKE, SHELDON ([id number]) Fat Layer (Subcutaneous Tissue) Exposed: Yes Fascia: No Tendon: No Muscle: No Joint: No Bone: No Epithelialization: None N/A N/A Treatment Notes Electronic Signature(s) Signed: 05/13/2019 4:09:10 PM By: [name] Entered By: [name] on 05/13/2019 11:04:22 Melinda [name] ([id number]) -------------------------------------------------------------------------------- [place] Details Patient Name: Melinda [name] Date of Service: 05/13/2019 10:45 AM Medical Record Number: [id number] Patient Account Number: [id number] Date of Birth/Sex: 11-24-1955 (64 y.o. F) Treating RN: [name] Primary Care Marleena Shubert: [name] Other Clinician: Referring Olar Santini: [name] Treating Ostin Mathey/Extender: [name], [name]: 10 Active Inactive Medication Nursing  Diagnoses: Knowledge deficit related to medication safety: actual or potential Goals: Patient/caregiver will demonstrate understanding of all current medications Date Initiated: 02/26/2019 Target Resolution Date: 02/26/2019 Goal Status: Active Interventions: Assess for medication contraindications each visit where new medications are prescribed Notes: Necrotic Tissue Nursing Diagnoses: Impaired tissue integrity related to necrotic/devitalized tissue Goals: Necrotic/devitalized tissue will be minimized in the wound bed Date Initiated: 02/26/2019 Target Resolution Date: 03/06/2019 Goal Status: Active Interventions: Assess patient pain level pre-, during and post procedure and prior to discharge Treatment Activities: Apply topical anesthetic as ordered : 02/26/2019 Notes: Orientation to the Wound Care Program Nursing Diagnoses: Knowledge deficit related to the wound healing center program Goals: Patient/caregiver will verbalize understanding of the [place] Program Date Initiated: 02/26/2019 Target Resolution Date: 02/26/2019 Goal Status: Active PHRONSIE, CUSSEN ([id number]) Interventions: Provide education on orientation to the wound center Notes: Pressure Nursing Diagnoses: Knowledge deficit related to causes and risk factors for pressure ulcer development Knowledge deficit related to management of pressures ulcers Goals: Patient will remain free from development of additional pressure ulcers Date Initiated: 02/26/2019 Target Resolution Date: 02/26/2019 Goal Status: Active Patient will remain free of pressure ulcers Date Initiated: 02/26/2019 Target Resolution Date: 02/26/2019 Goal Status: Active Patient/caregiver will verbalize understanding of pressure ulcer management Date Initiated: 02/26/2019 Target Resolution Date: 02/26/2019 Goal Status: Active Interventions: Assess: immobility, friction, shearing, incontinence upon admission and as  needed Notes: Wound/Skin Impairment Nursing Diagnoses: Impaired tissue integrity Goals: Ulcer/skin breakdown will have a volume reduction of 30% by week 4 Date Initiated: 02/26/2019 Target Resolution Date: 03/29/2019 Goal Status: Active Interventions: Provide education on ulcer and skin care Treatment Activities: Topical wound management initiated : 02/26/2019 Notes: Electronic Signature(s) Signed: 05/13/2019 4:09:10 PM By: [name] Entered By: [name] on 05/13/2019 11:04:15 Melinda [name] ([id number]) -------------------------------------------------------------------------------- Pain Assessment Details Patient Name: Melinda [name] Date of Service: 05/13/2019 10:45 AM Medical Record Number: [id number] Patient Account Number: [id number] Date of Birth/Sex: 1955-08-26 (64 y.o. F) Treating RN: [name] Primary Care Beva Remund: [name] Other Clinician: Referring Jakevion Arney: [name] Treating Jacari Kirsten/Extender: [name], [name]: 10 Active Problems Location of Pain Severity and Description of Pain Patient Has Paino No Site Locations Pain Management and Medication Current Pain Management: Electronic Signature(s) Signed: 05/13/2019 4:09:10 PM By: [name] Signed: 05/13/2019 4:10:34 PM By: Lorine Bears RCP, RRT, CHT Entered By: Becky Sax, Amado Nash on 05/13/2019 10:46:34  MACKENSEY, OLLIFF ([id number]) -------------------------------------------------------------------------------- Patient/Caregiver Education Details Patient Name: BRAD, FALARDEAU. Date of Service: 05/13/2019 10:45 AM Medical Record Number: [id number] Patient Account Number: [id number] Date of Birth/Gender: 1956/03/29 (64 y.o. F) Treating RN: [name] Primary Care Physician: [name] Other Clinician: Referring Physician: [name] Treating Physician/Extender: Sharalyn Ink in Treatment: 10 Education Assessment Education  Provided To: Patient Education Topics Provided Wound/Skin Impairment: Handouts: Other: wound care as ordered Methods: Demonstration, Explain/Verbal Responses: State content correctly Electronic Signature(s) Signed: 05/13/2019 4:09:10 PM By: [name] Entered By: [name] on 05/13/2019 11:05:48 Melinda [name] ([id number]) -------------------------------------------------------------------------------- Wound Assessment Details Patient Name: Melinda [name] Date of Service: 05/13/2019 10:45 AM Medical Record Number: [id number] Patient Account Number: [id number] Date of Birth/Sex: [birth year] (64 y.o. F) Treating RN: [name] Primary Care Duy Lemming: [name] Other Clinician: Referring Chancey Cullinane: [name] Treating Delfin Squillace/Extender: [name], [name]: 10 Wound Status Wound Number: 1 Primary Diabetic Wound/Ulcer of the Lower Extremity Etiology: Wound Location: Left Toe Great - Plantar Wound Open Wounding Event: Gradually Appeared Status: Date Acquired: 11/30/2018 Comorbid Cataracts, Coronary Artery Disease, Weeks Of Treatment: 10 History: Hypertension, Myocardial Infarction, Peripheral Clustered Wound: No Venous Disease, Type II Diabetes, Osteoarthritis, Osteomyelitis, Neuropathy Photos Wound Measurements Length: (cm) 1 % Reducti Width: (cm) 0.7 % Reducti Depth: (cm) 0.2 Epithelia Area: (cm) 0.55 Tunnelin Volume: (cm) 0.11 Undermin on in Area: 82.5% on in Volume: 93% lization: None g: No ing: No Wound Description Classification: Grade 2 Foul Odo Wound Margin: Epibole Slough/F Exudate Amount: Medium Exudate Type: Serosanguineous Exudate Color: red, brown r After Cleansing: No ibrino Yes Wound Bed Granulation Amount: Medium (34-66%) Exposed Structure Granulation Quality: Pale Fascia Exposed: No Necrotic Amount: Medium (34-66%) Fat Layer (Subcutaneous Tissue) Exposed: Yes Necrotic Quality: Adherent Slough Tendon Exposed:  No Muscle Exposed: No Joint Exposed: No Bone Exposed: No Brouillard, [place]. ([id number]) Treatment Notes Wound #1 (Left, Plantar Toe Great) Notes silvercel, Foam, conform to foot Electronic Signature(s) Signed: 05/14/2019 4:52:26 PM By: [name], [name], RN, [name], [name] RN, [name] Entered By: [name], [name], RN, [name], [name] on 05/13/2019 10:52:46 Melinda [name] ([id number]) -------------------------------------------------------------------------------- Vitals Details Patient Name: Melinda [name] Date of Service: 05/13/2019 10:45 AM Medical Record Number: [id number] Patient Account Number: [id number] Date of Birth/Sex: 1956-03-20 (64 y.o. F) Treating RN: [name] Primary Care Kyaira Trantham: [name] Other Clinician: Referring Keagan Anthis: [name] Treating Caraline Deutschman/Extender: [name], [name]: 10 Vital Signs Time Taken: 10:45 Temperature (F): 98.7 Height (in): 68 Pulse (bpm): 90 Weight (lbs): 300 Respiratory Rate (breaths/min): 16 Body Mass Index (BMI): 45.6 Blood Pressure (mmHg): 126/62 Reference Range: 80 - 120 mg / dl Electronic Signature(s) Signed: 05/13/2019 4:10:34 PM By: Lorine Bears RCP, RRT, CHT Entered By: Lorine Bears on 05/13/2019 10:49:26

## 2019-05-20 ENCOUNTER — Other Ambulatory Visit: Payer: Self-pay

## 2019-05-20 ENCOUNTER — Encounter: Payer: Medicare HMO | Admitting: Physician Assistant

## 2019-05-20 DIAGNOSIS — E11621 Type 2 diabetes mellitus with foot ulcer: Secondary | ICD-10-CM | POA: Diagnosis not present

## 2019-05-20 NOTE — Progress Notes (Addendum)
OLIVEAH, WOLFGRAM ([id number]) Visit Report for 05/20/2019 Chief Complaint Document Details Patient Name: Melinda [name], Melinda [name] Date of Service: 05/20/2019 10:15 AM Medical Record Number: [id number] Patient Account Number: [id number] Date of Birth/Sex: 1955/08/06 (64 y.o. F) Treating RN: [name] Primary Care Provider: [name] Other Clinician: Referring Provider: [name] Treating Provider/Extender: [name], Shatina Streets Weeks in Treatment: 11 Information Obtained from: Patient Chief Complaint Left great toe ulcer and left LE skin tear Electronic Signature(s) Signed: 05/20/2019 10:11:01 AM By: [name] [name] Entered By: [name] on 05/20/2019 10:11:00 Melinda [name] ([id number]) -------------------------------------------------------------------------------- Debridement Details Patient Name: Melinda [name] Date of Service: 05/20/2019 10:15 AM Medical Record Number: [id number] Patient Account Number: [id number] Date of Birth/Sex: 29-Sep-1955 (64 y.o. F) Treating RN: Army Melia Primary Care Provider: [name] Other Clinician: Referring Provider: [name] Treating Provider/Extender: [name], Ajeenah Heiny Weeks in Treatment: 11 Debridement Performed for Wound #1 Left,Plantar Toe Great Assessment: Performed By: Physician [name], Samhitha Rosen E., [name] Debridement Type: Debridement Severity of Tissue Pre Fat layer exposed Debridement: Level of Consciousness (Pre- Awake and Alert procedure): Pre-procedure Verification/Time Yes - 10:13 Out Taken: Start Time: 10:13 Pain Control: Lidocaine Total Area Debrided (L x W): 0.9 (cm) x 0.6 (cm) = 0.54 (cm) Tissue and other material Viable, Non-Viable, Callus, Slough, Subcutaneous, Slough debrided: Level: Skin/Subcutaneous Tissue Debridement Description: Excisional Instrument: Curette Bleeding: Minimum Hemostasis Achieved: Pressure End Time: 10:14 Response to Treatment: Procedure was tolerated well Level of  Consciousness Awake and Alert (Post-procedure): Post Debridement Measurements of Total Wound Length: (cm) 0.9 Width: (cm) 0.6 Depth: (cm) 0.2 Volume: (cm) 0.085 Character of Wound/Ulcer Post Debridement: Stable Severity of Tissue Post Debridement: Fat layer exposed Post Procedure Diagnosis Same as Pre-procedure Electronic Signature(s) Signed: 05/20/2019 1:44:57 PM By: [name] [name] Signed: 05/20/2019 3:23:32 PM By: Army Melia Entered By: Army Melia on 05/20/2019 10:13:43 Melinda [name] ([id number]) -------------------------------------------------------------------------------- HPI Details Patient Name: Melinda [name] Date of Service: 05/20/2019 10:15 AM Medical Record Number: [id number] Patient Account Number: [id number] Date of Birth/Sex: 1955/07/14 (64 y.o. 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 She has been followed by Dr. Sherren Mocha cline of podiatry for problems related to her feet dating back to the early part of 2019 as I can review in [place] link. 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 Subsequently she was seen by Dr. Lucky Cowboy of vascular surgery and had stents x2 placed in her left SFA as well as left SFA angioplasties on 05/20/2017. She was noted to have a wound on her left foot in October 2019. In August 2020 on 8/24 she underwent a right anterior tibial artery angioplasty a right tibioperoneal trunk angioplasty and a right SFA angioplasty. The patient states that she developed a left great toe wound in August which is at the base of her previous partial amputation in this area. She tells [name] that she has had a right great  toe wound since December 2019 and she has been using Santyl to both of these areas that she received from a fellow parishioner at her church. By enlarge she has been using Neosporin to these areas and not offloading them specifically Arterial studies on 9/22 showed an ABI on the right of 0.71 with  triphasic waveforms on the left at 0.88 with triphasic and biphasic waveforms. TBI's on the right and 0.44 and on the left at 1.05. 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 11//20; x-rays I did last week were both negative for osteomyelitis. She has a fairly large wound at the base of her left first toe and a small punched out area on the right first toe. We use silver alginate last week 03/13/2019 upon evaluation today patient appears to be doing okay with regard to her wounds at this point. She does have some callus buildup noted upon evaluation at this point. Fortunately there is no evidence of active infection which is also good news. I am going to have to perform some debridement to clear away some of the necrotic tissue today. 03/25/2019 on evaluation today patient appears to be doing well with regard to her foot ulcers. She has been tolerating the dressing changes without complication. Fortunately there is no signs of active infection at this time. Her left foot ulcer actually seems to be doing excellent no debridement even necessary today I am good have to perform some debridement on the right great toe. 04/08/19 on evaluation today patient actually appears to be doing well with regard to her wounds. In fact on the right this appears to be completely healed on the left this is measuring smaller although there still like callous around the edges of the wound. Fortunately there's no evidence of active infection at this time there is  some hyper granulation. 04/15/2019 on evaluation today patient actually appears to be doing well with regard to her toe ulcer. This seems to be showing signs of excellent granulation there is minimal slough/biofilm on the surface of the wound. She does have a significant amount of callus around the edges of the wound but at the same time I feel like that this is something we can easily pared down without any complication. Fortunately there is no evidence of active infection at this point. No fevers, chills, nausea, vomiting, or diarrhea. 04/22/2019 on evaluation today patient appears to be doing somewhat better in regard to her wound. She has been tolerating the dressing changes without complication. There is some callus noted at this point this can require some sharp debridement which I discussed with the patient as well. We will go ahead and proceed with debridement today to try to clear away some of this necrotic callus as well as clean off the biofilm/slough from the surface of the wound. AAVAH, DEARDORFF ([id number]) 12/29-Patient returns at 1 week with regards to her left plantar foot wound which seems to be doing well, the callus was debrided around the wound the last time and seems to be doing much better since. Apparently it standing smaller, patient is a little discomforted by having to come every week to the clinic but she agrees to do that 05/06/19 on evaluation today patient actually appears to be doing well overall with regard to her plantar foot ulcer. She does have some callous buildup today but nonetheless this does not appear to be showing any signs of active infection at this time which is great news. The base of the wound does seem to be much healthier than what it was last time I saw her. No fevers, chills, nausea, or vomiting noted at this time. 05/13/2019 on evaluation today patient appears to be doing well  with regard to her plantar foot ulcer. She has been tolerating the  dressing changes without complication. In fact I am not even sure there is anything that is going require sharp debridement at this point today which is also good news. Fortunately there is no signs of active infection at this time. No fevers, chills, nausea, vomiting, or diarrhea. 05/19/2018 upon evaluation today patient actually appears to be doing excellent in regard to her wound on the plantar foot. She has been tolerating the dressing changes without complication. Fortunately there is no signs of active infection at this time which is good news. No fevers, chills, nausea, vomiting, or diarrhea. Electronic Signature(s) Signed: 05/20/2019 11:00:18 AM By: [name] [name] Entered By: [name] on 05/20/2019 11:00:18 Melinda [name] ([id number]) -------------------------------------------------------------------------------- Physical Exam Details Patient Name: Melinda [name] Date of Service: 05/20/2019 10:15 AM Medical Record Number: [id number] Patient Account Number: [id number] Date of Birth/Sex: June 06, 1955 (64 y.o. F) Treating RN: [name] Primary Care Provider: [name] Other Clinician: Referring Provider: [name] Treating Provider/Extender: [name], Charlyn Vialpando Weeks in Treatment: 84 Constitutional Well-nourished and well-hydrated in no acute distress. Respiratory normal breathing without difficulty. Psychiatric this patient is able to make decisions and demonstrates good insight into disease process. Alert and Oriented x 3. pleasant and cooperative. Notes Patient's wound bed currently did require some sharp debridement to remove callus from the periwound as well as slough from the surface of the wound which he tolerated today without complication and post debridement the wound bed appears to be doing significantly better which is excellent news. Overall I am very pleased with how things seem to be progressing. Electronic Signature(s) Signed: 05/20/2019 11:00:38  AM By: [name] [name] Entered By: [name] on 05/20/2019 11:00:37 Melinda [name] ([id number]) -------------------------------------------------------------------------------- Physician Orders Details Patient Name: Melinda [name] Date of Service: 05/20/2019 10:15 AM Medical Record Number: [id number] Patient Account Number: [id number] Date of Birth/Sex: 1956/03/31 (64 y.o. F) Treating RN: Army Melia Primary Care Provider: [name] Other Clinician: Referring Provider: [name] Treating Provider/Extender: [name], Sherrill Buikema Weeks in Treatment: 11 Verbal / Phone Orders: No Diagnosis Coding ICD-10 Coding Code Description E11.621 Type 2 diabetes mellitus with foot ulcer E11.51 Type 2 diabetes mellitus with diabetic peripheral angiopathy without gangrene 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sed S81.802A Unspecified open wound, left lower leg, initial encounter E11.42 Type 2 diabetes mellitus with diabetic polyneuropathy Wound Cleansing Wound #1 Left,Plantar Toe Great o Cleanse wound with mild soap and water o No tub bath. Anesthetic (add to Medication List) Wound #1 Left,Plantar Toe Great o Topical Lidocaine 4% cream applied to wound bed prior to debridement (In Clinic Only). Primary Wound Dressing Wound #1 Left,Plantar Toe Great o Silver Alginate Secondary Dressing Wound #1 Left,Plantar Toe Great o Gauze and Kerlix/Conform o Foam Dressing Change Frequency Wound #1 Left,Plantar Toe Great o Change dressing every other day. Follow-up Appointments o Return Appointment in 1 week. Edema Control Wound #1 Left,Plantar Toe Great o Elevate legs to the level of the heart and pump ankles as often as possible Off-Loading Wound #1 Left,Plantar Toe Great o Open toe surgical shoe with peg assist. LURDES, STULTZ E. ([id number]) Additional Orders / Instructions o Stop Smoking o Increase protein  intake. Electronic Signature(s) Signed: 05/20/2019 1:44:57 PM By: [name] [name] Signed: 05/20/2019 3:23:32 PM By: Army Melia Entered By: Army Melia on 05/20/2019 10:14:28 Melinda [name] ([id number]) -------------------------------------------------------------------------------- Problem List Details Patient  Name: LORRAIN, DURU. Date of Service: 05/20/2019 10:15 AM Medical Record Number: [id number] Patient Account Number: [id number] Date of Birth/Sex: [birth year] (64 y.o. F) Treating RN: [name] Primary Care Provider: [name] Other Clinician: Referring Provider: [name] Treating Provider/Extender: [name], Arelis Neumeier Weeks in Treatment: 11 Active Problems ICD-10 Evaluated Encounter Code Description Active Date Today Diagnosis E11.621 Type 2 diabetes mellitus with foot ulcer 02/26/2019 No Yes E11.51 Type 2 diabetes mellitus with diabetic peripheral angiopathy 02/26/2019 No Yes without gangrene L97.522 Non-pressure chronic ulcer of other part of left foot with fat 02/26/2019 No Yes layer exposed S81.802A Unspecified open wound, left lower leg, initial encounter 04/15/2019 No Yes E11.42 Type 2 diabetes mellitus with diabetic polyneuropathy 02/26/2019 No Yes Inactive Problems Resolved Problems ICD-10 Code Description Active Date Resolved Date L97.512 Non-pressure chronic ulcer of other part of right foot with fat layer 02/26/2019 02/26/2019 exposed Electronic Signature(s) Signed: 05/20/2019 10:10:48 AM By: [name] [name] Entered By: [name] on 05/20/2019 10:10:48 Melinda [name] ([id number]) -------------------------------------------------------------------------------- Progress Note Details Patient Name: Melinda [name] Date of Service: 05/20/2019 10:15 AM Medical Record Number: [id number] Patient Account Number: [id number] Date of Birth/Sex: August 18, 1955 (64 y.o. 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 She has been followed by Dr. Sherren Mocha cline of podiatry for problems related to her feet dating back to the early part of 2019 as I can review in [place] link. 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 Subsequently she was seen by Dr. Lucky Cowboy of vascular surgery and had stents x2 placed in her left SFA as well as left SFA angioplasties on 05/20/2017. She was noted to have a wound on her left foot in October 2019. In August 2020 on 8/24 she underwent a right anterior tibial artery angioplasty a right tibioperoneal trunk angioplasty and a right SFA angioplasty. The patient states that she developed a left great toe wound in August which is at the base of her previous partial amputation in this area. She tells [name] that she has had a right great toe wound since December 2019 and she has been using Santyl to both of these areas that she received from a fellow parishioner at her church. By enlarge she has been using Neosporin to these areas and not offloading them specifically Arterial studies on 9/22 showed an ABI on the right of 0.71 with triphasic waveforms on the left at 0.88 with triphasic and biphasic waveforms. TBI's on the right and 0.44 and on the left at 1.05. 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 11//20; x-rays I did last week were both negative for osteomyelitis. She has a fairly large wound at the base of her left first toe and  a small punched out area on the right first toe. We use silver alginate last week 03/13/2019 upon evaluation today patient appears to be doing okay with regard to her wounds at this point. She does have some callus buildup noted upon evaluation at this point. Fortunately there is no evidence of active infection  which is also good news. I am going to have to perform some debridement to clear away some of the necrotic tissue today. 03/25/2019 on evaluation today patient appears to be doing well with regard to her foot ulcers. She has been tolerating the dressing changes without complication. Fortunately there is no signs of active infection at this time. Her left foot ulcer actually seems to be doing excellent no debridement even necessary today I am good have to perform some debridement on the right great toe. 04/08/19 on evaluation today patient actually appears to be doing well with regard to her wounds. In fact on the right this appears to be completely healed on the left this is measuring smaller although there still like callous around the edges of the wound. Fortunately there's no evidence of active infection at this time there is some hyper granulation. 04/15/2019 on evaluation today patient actually appears to be doing well with regard to her toe ulcer. This seems to be showing signs of excellent granulation there is minimal slough/biofilm on the surface of the wound. She does have a significant amount of callus around the edges of the wound but at the same time I feel like that this is something we can easily pared down without any complication. Fortunately there is no evidence of active infection at this point. No fevers, chills, nausea, vomiting, or diarrhea. TANZIE, LASLIE ([id number]) 04/22/2019 on evaluation today patient appears to be doing somewhat better in regard to her wound. She has been tolerating the dressing changes without complication. There is some callus noted at this  point this can require some sharp debridement which I discussed with the patient as well. We will go ahead and proceed with debridement today to try to clear away some of this necrotic callus as well as clean off the biofilm/slough from the surface of the wound. 12/29-Patient returns at 1 week with regards to her left plantar foot wound which seems to be doing well, the callus was debrided around the wound the last time and seems to be doing much better since. Apparently it standing smaller, patient is a little discomforted by having to come every week to the clinic but she agrees to do that 05/06/19 on evaluation today patient actually appears to be doing well overall with regard to her plantar foot ulcer. She does have some callous buildup today but nonetheless this does not appear to be showing any signs of active infection at this time which is great news. The base of the wound does seem to be much healthier than what it was last time I saw her. No fevers, chills, nausea, or vomiting noted at this time. 05/13/2019 on evaluation today patient appears to be doing well with regard to her plantar foot ulcer. She has been tolerating the dressing changes without complication. In fact I am not even sure there is anything that is going require sharp debridement at this point today which is also good news. Fortunately there is no signs of active infection at this time. No fevers, chills, nausea, vomiting, or diarrhea. 05/19/2018 upon evaluation today patient actually appears to be doing excellent in regard to her wound on the plantar foot. She has been tolerating the dressing changes without complication. Fortunately there is no signs of active infection at this time which is good news. No fevers, chills, nausea, vomiting, or diarrhea. Objective Constitutional Well-nourished and well-hydrated in no acute distress. Vitals Time Taken: 10:00 AM, Height: 68 in, Weight: 300 lbs, BMI: 45.6, Temperature:  99.2  F, Pulse: 89 bpm, Respiratory Rate: 16 breaths/min, Blood Pressure: 126/47 mmHg. Respiratory normal breathing without difficulty. Psychiatric this patient is able to make decisions and demonstrates good insight into disease process. Alert and Oriented x 3. pleasant and cooperative. General Notes: Patient's wound bed currently did require some sharp debridement to remove callus from the periwound as well as slough from the surface of the wound which he tolerated today without complication and post debridement the wound bed appears to be doing significantly better which is excellent news. Overall I am very pleased with how things seem to be progressing. Integumentary (Hair, Skin) Wound #1 status is Open. Original cause of wound was Gradually Appeared. The wound is located on the [employer]. The wound measures 0.9cm length x 0.6cm width x 0.2cm depth; 0.424cm^2 area and 0.085cm^3 volume. There is Fat Layer (Subcutaneous Tissue) Exposed exposed. There is no tunneling or undermining noted. There is a medium amount of serosanguineous drainage noted. The wound margin is epibole. There is medium (34-66%) pale granulation within the [place] ([id number]) wound bed. There is a medium (34-66%) amount of necrotic tissue within the wound bed including Adherent Slough. Assessment Active Problems ICD-10 Type 2 diabetes mellitus with foot ulcer Type 2 diabetes mellitus with diabetic peripheral angiopathy without gangrene Non-pressure chronic ulcer of other part of left foot with fat layer exposed Unspecified open wound, left lower leg, initial encounter Type 2 diabetes mellitus with diabetic polyneuropathy Procedures Wound #1 Pre-procedure diagnosis of Wound #1 is a Diabetic Wound/Ulcer of the Lower Extremity located on the Left,Plantar Toe Great .Severity of Tissue Pre Debridement is: Fat layer exposed. There was a Excisional Skin/Subcutaneous Tissue Debridement with a total area  of 0.54 sq cm performed by [name], Renardo Cheatum E., [name]. With the following instrument(s): Curette to remove Viable and Non-Viable tissue/material. Material removed includes Callus, Subcutaneous Tissue, and Slough after achieving pain control using Lidocaine. A time out was conducted at 10:13, prior to the start of the procedure. A Minimum amount of bleeding was controlled with Pressure. The procedure was tolerated well. Post Debridement Measurements: 0.9cm length x 0.6cm width x 0.2cm depth; 0.085cm^3 volume. Character of Wound/Ulcer Post Debridement is stable. Severity of Tissue Post Debridement is: Fat layer exposed. Post procedure Diagnosis Wound #1: Same as Pre-Procedure Plan Wound Cleansing: Wound #1 Left,Plantar Toe Great: Cleanse wound with mild soap and water No tub bath. Anesthetic (add to Medication List): Wound #1 Left,Plantar Toe Great: Topical Lidocaine 4% cream applied to wound bed prior to debridement (In Clinic Only). Primary Wound Dressing: Wound #1 Left,Plantar Toe Great: Silver Alginate Secondary Dressing: Wound #1 Left,Plantar Toe Great: Gauze and Kerlix/Conform Foam Dressing Change Frequency: Wound #1 Left,Plantar Toe Great: Change dressing every other day. DEICY, HOLLAR ([id number]) Follow-up Appointments: Return Appointment in 1 week. Edema Control: Wound #1 Left,Plantar Toe Great: Elevate legs to the level of the heart and pump ankles as often as possible Off-Loading: Wound #1 Left,Plantar Toe Great: Open toe surgical shoe with peg assist. Additional Orders / Instructions: Stop Smoking Increase protein intake. 1. My suggestion at this time is good to be that we going continue with the current wound care measures which includes the silver alginate dressing. The wound has been somewhat moist and this seems to keep things nice and dry overall I think it is done well I see no reason to switch. 2. I am in a suggest as well that we continue with the gauze  followed by a con form  to secure in place or using foam as well for padding and offloading. We will see patient back for reevaluation in 1 week here in the clinic. If anything worsens or changes patient will contact our office for additional recommendations. Electronic Signature(s) Signed: 05/20/2019 11:01:08 AM By: [name] [name] Entered By: [name] on 05/20/2019 11:01:08 Melinda [name] ([id number]) -------------------------------------------------------------------------------- SuperBill Details Patient Name: Melinda [name] Date of Service: 05/20/2019 Medical Record Number: [id number] Patient Account Number: [id number] Date of Birth/Sex: 11/29/1955 (64 y.o. F) Treating RN: [name] Primary Care Provider: [name] Other Clinician: Referring Provider: [name] Treating Provider/Extender: [name], Thorsten Climer Weeks in Treatment: 11 Diagnosis Coding ICD-10 Codes Code Description E11.621 Type 2 diabetes mellitus with foot ulcer E11.51 Type 2 diabetes mellitus with diabetic peripheral angiopathy without gangrene 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sed S81.802A Unspecified open wound, left lower leg, initial encounter E11.42 Type 2 diabetes mellitus with diabetic polyneuropathy Facility Procedures CPT4 Code: [id number] Description: [id number] - DEB SUBQ TISSUE 20 SQ CM/< ICD-10 Diagnosis Description L97.522 Non-pressure chronic ulcer of other part of left foot with fat Modifier: layer exposed Quantity: 1 Physician Procedures CPT4 Code: [id number] Description: 11042 - WC PHYS SUBQ TISS 20 SQ CM ICD-10 Diagnosis Description L97.522 Non-pressure chronic ulcer of other part of left foot with fat Modifier: layer exposed Quantity: 1 Electronic Signature(s) Signed: 05/20/2019 11:01:27 AM By: [name] [name] Entered By: [name] on 05/20/2019 11:01:27

## 2019-05-22 ENCOUNTER — Other Ambulatory Visit: Payer: Self-pay

## 2019-05-22 MED ORDER — POTASSIUM CHLORIDE CRYS ER 20 MEQ PO TBCR: EXTENDED_RELEASE_TABLET | ORAL | 0 refills | Status: DC

## 2019-05-27 ENCOUNTER — Other Ambulatory Visit: Payer: Self-pay

## 2019-05-27 ENCOUNTER — Encounter: Payer: Medicare HMO | Admitting: Physician Assistant

## 2019-05-27 DIAGNOSIS — E11621 Type 2 diabetes mellitus with foot ulcer: Secondary | ICD-10-CM | POA: Diagnosis not present

## 2019-05-27 NOTE — Progress Notes (Addendum)
Melinda [name] ([id number]) Visit Report for 05/27/2019 Arrival Information Details Patient Name: Melinda [name] Date of Service: 05/27/2019 10:15 AM Medical Record Number: [id number] Patient Account Number: [id number] Date of Birth/Sex: [birth year] (64 y.o. F) Treating RN: Army Melia Primary Care Catalyna Reilly: [name] Other Clinician: Referring Lakynn Halvorsen: [name] Treating Proctor Carriker/Extender: [name], [name]: 12 Visit Information History Since Last Visit Added or deleted any medications: No Patient Arrived: Ambulatory Any new allergies or adverse reactions: No Arrival Time: 10:28 Had a fall or experienced change in No Accompanied By: self activities of daily living that may affect Transfer Assistance: None risk of falls: Patient Identification Verified: Yes Signs or symptoms of abuse/neglect since last visito No Patient Has Alerts: Yes Hospitalized since last visit: No Patient Alerts: Patient on Blood Thinner Has Dressing in Place as Prescribed: Yes Plavix, Aspirin 81mg  Pain Present Now: No ABI's L .88/R .71 Electronic Signature(s) Signed: 05/27/2019 4:47:52 PM By: Army Melia Entered By: Army Melia on 05/27/2019 10:28:57 Melinda [name] ([id number]) -------------------------------------------------------------------------------- Encounter Discharge Information Details Patient Name: Melinda [name] Date of Service: 05/27/2019 10:15 AM Medical Record Number: [id number] Patient Account Number: [id number] Date of Birth/Sex: [birth year] (64 y.o. F) Treating RN: Army Melia Primary Care Estanislao Harmon: [name] Other Clinician: Referring Kamron Portee: [name] Treating Ronnette Rump/Extender: [name], [name]: 12 Encounter Discharge Information Items Post Procedure Vitals Discharge Condition: Stable Temperature (F): 99.5 Ambulatory Status: Ambulatory Pulse (bpm): 91 Discharge Destination: Home Respiratory Rate (breaths/min):  16 Transportation: Private Auto Blood Pressure (mmHg): 106/58 Accompanied By: self Schedule Follow-up Appointment: Yes Clinical Summary of Care: Electronic Signature(s) Signed: 05/27/2019 4:47:52 PM By: Army Melia Entered By: Army Melia on 05/27/2019 11:14:38 Melinda [name] ([id number]) -------------------------------------------------------------------------------- Lower Extremity Assessment Details Patient Name: Melinda [name] Date of Service: 05/27/2019 10:15 AM Medical Record Number: [id number] Patient Account Number: [id number] Date of Birth/Sex: Mar 31, 1956 (64 y.o. F) Treating RN: Army Melia Primary Care Acel Natzke: [name] Other Clinician: Referring Javante Nilsson: [name] Treating Cortlyn Cannell/Extender: [name], [name]: 12 Edema Assessment Assessed: [Left: No] [Right: No] Edema: [Left: N] [Right: o] Vascular Assessment Pulses: Dorsalis Pedis Palpable: [Left:Yes] Electronic Signature(s) Signed: 05/27/2019 4:47:52 PM By: Army Melia Entered By: Army Melia on 05/27/2019 10:33:23 Melinda [name] ([id number]) -------------------------------------------------------------------------------- Multi Wound Chart Details Patient Name: Melinda [name] Date of Service: 05/27/2019 10:15 AM Medical Record Number: [id number] Patient Account Number: [id number] Date of Birth/Sex: Jun 21, 1955 (64 y.o. F) Treating RN: Army Melia Primary Care Carvell Hoeffner: [name] Other Clinician: Referring Santino Kinsella: [name] Treating Usha Slager/Extender: [name], [name]: 12 Vital Signs Height(in): 68 Pulse(bpm): 91 Weight(lbs): 300 Blood Pressure(mmHg): 106/58 Body Mass Index(BMI): 46 Temperature(F): 99.5 Respiratory Rate 16 (breaths/min): Photos: [N/A:N/A] Wound Location: Left Toe Great - Plantar N/A N/A Wounding Event: Gradually Appeared N/A N/A Primary Etiology: Diabetic Wound/Ulcer of the N/A N/A Lower Extremity Comorbid History:  Cataracts, Coronary Artery N/A N/A Disease, Hypertension, Myocardial Infarction, Peripheral Venous Disease, Type II Diabetes, Osteoarthritis, Osteomyelitis, Neuropathy Date Acquired: 11/30/2018 N/A N/A Weeks of Treatment: 12 N/A N/A Wound Status: Open N/A N/A Measurements L x W x D 0.7x0.7x0.2 N/A N/A (cm) Area (cm) : 0.385 N/A N/A Volume (cm) : 0.077 N/A N/A % Reduction in Area: 87.70% N/A N/A % Reduction in Volume: 95.10% N/A N/A Classification: Grade 2 N/A N/A Exudate Amount: Medium N/A N/A Exudate Type: Serosanguineous N/A N/A Exudate Color: red, brown N/A N/A Wound Margin: Epibole N/A N/A Granulation Amount: Large (67-100%) N/A N/A Granulation Quality:  Pale N/A N/A Necrotic Amount: Small (1-33%) N/A N/A Exposed Structures: N/A N/A Melinda [name] ([id number]) Fat Layer (Subcutaneous Tissue) Exposed: Yes Fascia: No Tendon: No Muscle: No Joint: No Bone: No Epithelialization: None N/A N/A Treatment Notes Electronic Signature(s) Signed: 05/27/2019 4:47:52 PM By: Army Melia Entered By: Army Melia on 05/27/2019 11:12:29 Melinda [name] ([id number]) -------------------------------------------------------------------------------- Celeste Details Patient Name: Melinda [name] Date of Service: 05/27/2019 10:15 AM Medical Record Number: [id number] Patient Account Number: [id number] Date of Birth/Sex: Oct 10, 1955 (64 y.o. F) Treating RN: Army Melia Primary Care Jonathon Castelo: [name] Other Clinician: Referring Rockie Schnoor: [name] Treating Zyrion Coey/Extender: [name], [name]: 12 Active Inactive Medication Nursing Diagnoses: Knowledge deficit related to medication safety: actual or potential Goals: Patient/caregiver will demonstrate understanding of all current medications Date Initiated: 02/26/2019 Target Resolution Date: 02/26/2019 Goal Status: Active Interventions: Assess for medication contraindications each visit  where new medications are prescribed Notes: Necrotic Tissue Nursing Diagnoses: Impaired tissue integrity related to necrotic/devitalized tissue Goals: Necrotic/devitalized tissue will be minimized in the wound bed Date Initiated: 02/26/2019 Target Resolution Date: 03/06/2019 Goal Status: Active Interventions: Assess patient pain level pre-, during and post procedure and prior to discharge Treatment Activities: Apply topical anesthetic as ordered : 02/26/2019 Notes: Pressure Nursing Diagnoses: Knowledge deficit related to causes and risk factors for pressure ulcer development Knowledge deficit related to management of pressures ulcers Goals: Patient will remain free from development of additional pressure ulcers Date Initiated: 02/26/2019 Target Resolution Date: 02/26/2019 TIHESHA, FRANKLYN ([id number]) Goal Status: Active Patient will remain free of pressure ulcers Date Initiated: 02/26/2019 Target Resolution Date: 02/26/2019 Goal Status: Active Patient/caregiver will verbalize understanding of pressure ulcer management Date Initiated: 02/26/2019 Target Resolution Date: 02/26/2019 Goal Status: Active Interventions: Assess: immobility, friction, shearing, incontinence upon admission and as needed Notes: Wound/Skin Impairment Nursing Diagnoses: Impaired tissue integrity Goals: Ulcer/skin breakdown will have a volume reduction of 30% by week 4 Date Initiated: 02/26/2019 Target Resolution Date: 03/29/2019 Goal Status: Active Interventions: Provide education on ulcer and skin care Treatment Activities: Topical wound management initiated : 02/26/2019 Notes: Electronic Signature(s) Signed: 05/27/2019 4:47:52 PM By: Army Melia Entered By: Army Melia on 05/27/2019 11:12:21 Melinda [name] ([id number]) -------------------------------------------------------------------------------- Pain Assessment Details Patient Name: Melinda [name] Date of Service: 05/27/2019 10:15  AM Medical Record Number: [id number] Patient Account Number: [id number] Date of Birth/Sex: 27-Mar-1956 (64 y.o. F) Treating RN: Army Melia Primary Care Rahma Meller: [name] Other Clinician: Referring Tyshika Baldridge: [name] Treating Barabara Motz/Extender: [name], [name]: 12 Active Problems Location of Pain Severity and Description of Pain Patient Has Paino No Site Locations Pain Management and Medication Current Pain Management: Electronic Signature(s) Signed: 05/27/2019 4:47:52 PM By: Army Melia Entered By: Army Melia on 05/27/2019 10:29:14 Melinda [name] ([id number]) -------------------------------------------------------------------------------- Patient/Caregiver Education Details Patient Name: Melinda [name] Date of Service: 05/27/2019 10:15 AM Medical Record Number: [id number] Patient Account Number: [id number] Date of Birth/Gender: 1955-09-09 (64 y.o. F) Treating RN: Army Melia Primary Care Physician: [name] Other Clinician: Referring Physician: [name] Treating Physician/Extender: Sharalyn Ink in Treatment: 12 Education Assessment Education Provided To: Patient Education Topics Provided Wound/Skin Impairment: Handouts: Caring for Your Ulcer Methods: Demonstration, Explain/Verbal Responses: State content correctly Electronic Signature(s) Signed: 05/27/2019 4:47:52 PM By: Army Melia Entered By: Army Melia on 05/27/2019 11:14:09 Melinda [name] ([id number]) -------------------------------------------------------------------------------- Wound Assessment Details Patient Name: Melinda [name] Date of Service: 05/27/2019 10:15 AM Medical Record Number: [id number] Patient Account Number: [id number] Date of Birth/Sex: Sep 04, 1955 (  64 y.o. F) Treating RN: Army Melia Primary Care Dorothy Landgrebe: [name] Other Clinician: Referring Essance Gatti: [name] Treating Bodi Palmeri/Extender: [name], [name]: 12 Wound  Status Wound Number: 1 Primary Diabetic Wound/Ulcer of the Lower Extremity Etiology: Wound Location: Left Toe Great - Plantar Wound Open Wounding Event: Gradually Appeared Status: Date Acquired: 11/30/2018 Comorbid Cataracts, Coronary Artery Disease, Weeks Of Treatment: 12 History: Hypertension, Myocardial Infarction, Peripheral Clustered Wound: No Venous Disease, Type II Diabetes, Osteoarthritis, Osteomyelitis, Neuropathy Photos Wound Measurements Length: (cm) 0.7 % Reduction Width: (cm) 0.7 % Reduction Depth: (cm) 0.2 Epithelializ Area: (cm) 0.385 Tunneling: Volume: (cm) 0.077 Undermining in Area: 87.7% in Volume: 95.1% ation: None No : No Wound Description Classification: Grade 2 Foul Odor A Wound Margin: Epibole Slough/Fibr Exudate Amount: Medium Exudate Type: Serosanguineous Exudate Color: red, brown fter Cleansing: No ino Yes Wound Bed Granulation Amount: Large (67-100%) Exposed Structure Granulation Quality: Pale Fascia Exposed: No Necrotic Amount: Small (1-33%) Fat Layer (Subcutaneous Tissue) Exposed: Yes Necrotic Quality: Adherent Slough Tendon Exposed: No Muscle Exposed: No Joint Exposed: No Bone Exposed: No [place], [place]. ([id number]) Treatment Notes Wound #1 (Left, Plantar Toe Great) Notes silvercel, Foam, conform to foot Electronic Signature(s) Signed: 05/27/2019 4:47:52 PM By: Army Melia Entered By: Army Melia on 05/27/2019 10:33:08 Melinda [name] ([id number]) -------------------------------------------------------------------------------- Vitals Details Patient Name: Melinda [name] Date of Service: 05/27/2019 10:15 AM Medical Record Number: [id number] Patient Account Number: [id number] Date of Birth/Sex: Apr 05, 1956 (64 y.o. F) Treating RN: Army Melia Primary Care Donell Tomkins: [name] Other Clinician: Referring Rily Nickey: [name] Treating Jemina Scahill/Extender: [name], [name]: 12 Vital Signs Time Taken:  10:29 Temperature (F): 99.5 Height (in): 68 Pulse (bpm): 91 Weight (lbs): 300 Respiratory Rate (breaths/min): 16 Body Mass Index (BMI): 45.6 Blood Pressure (mmHg): 106/58 Reference Range: 80 - 120 mg / dl Electronic Signature(s) Signed: 05/27/2019 4:47:52 PM By: Army Melia Entered By: Army Melia on 05/27/2019 10:29:30

## 2019-05-27 NOTE — Progress Notes (Addendum)
LEOR, CASCELLA ([id number]) Visit Report for 05/27/2019 Chief Complaint Document Details Patient Name: Melinda [name], Melinda [name] Date of Service: 05/27/2019 10:15 AM Medical Record Number: [id number] Patient Account Number: [id number] Date of Birth/Sex: [birth year] (64 y.o. F) Treating RN: [name] Primary Care Provider: [name] Other Clinician: Referring Provider: [name] Treating Provider/Extender: [name], Rue Valladares Weeks in Treatment: 12 Information Obtained from: Patient Chief Complaint Left great toe ulcer and left LE skin tear Electronic Signature(s) Signed: 05/27/2019 10:00:47 AM By: [name] [name] Entered By: [name] on 05/27/2019 10:00:47 Melinda [name] ([id number]) -------------------------------------------------------------------------------- Debridement Details Patient Name: Melinda [name] Date of Service: 05/27/2019 10:15 AM Medical Record Number: [id number] Patient Account Number: [id number] Date of Birth/Sex: 09/17/1955 (64 y.o. F) Treating RN: Army Melia Primary Care Provider: [name] Other Clinician: Referring Provider: [name] Treating Provider/Extender: [name], Tayia Stonesifer Weeks in Treatment: 12 Debridement Performed for Wound #1 Left,Plantar Toe Great Assessment: Performed By: Physician [name], Tylyn Stankovich E., [name] Debridement Type: Debridement Severity of Tissue Pre Fat layer exposed Debridement: Level of Consciousness (Pre- Awake and Alert procedure): Pre-procedure Verification/Time Yes - 11:10 Out Taken: Start Time: 11:12 Pain Control: Lidocaine Total Area Debrided (L x W): 0.7 (cm) x 0.7 (cm) = 0.49 (cm) Tissue and other material Viable, Non-Viable, Callus, Slough, Subcutaneous, Slough debrided: Level: Skin/Subcutaneous Tissue Debridement Description: Excisional Instrument: Curette Bleeding: Minimum Hemostasis Achieved: Pressure End Time: 11:14 Response to Treatment: Procedure was tolerated well Level of  Consciousness Awake and Alert (Post-procedure): Post Debridement Measurements of Total Wound Length: (cm) 0.7 Width: (cm) 0.7 Depth: (cm) 0.2 Volume: (cm) 0.077 Character of Wound/Ulcer Post Debridement: Stable Severity of Tissue Post Debridement: Fat layer exposed Post Procedure Diagnosis Same as Pre-procedure Electronic Signature(s) Signed: 05/27/2019 11:51:12 AM By: [name] [name] Signed: 05/27/2019 4:47:52 PM By: Army Melia Entered By: Army Melia on 05/27/2019 11:13:22 Melinda [name] ([id number]) -------------------------------------------------------------------------------- HPI Details Patient Name: Melinda [name] Date of Service: 05/27/2019 10:15 AM Medical Record Number: [id number] Patient Account Number: [id number] Date of Birth/Sex: 12/05/1955 (63 y.o. 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 She has been followed by Dr. Sherren Mocha cline of podiatry for problems related to her feet dating back to the early part of 2019 as I can review in [place] link. 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 Subsequently she was seen by Dr. Lucky Cowboy of vascular surgery and had stents x2 placed in her left SFA as well as left SFA angioplasties on 05/20/2017. She was noted to have a wound on her left foot in October 2019. In August 2020 on 8/24 she underwent a right anterior tibial artery angioplasty a right tibioperoneal trunk angioplasty and a right SFA angioplasty. The patient states that she developed a left great toe wound in August which is at the base of her previous partial amputation in this area. She tells [name] that she has had a right great toe  wound since December 2019 and she has been using Santyl to both of these areas that she received from a fellow parishioner at her church. By enlarge she has been using Neosporin to these areas and not offloading them specifically Arterial studies on 9/22 showed an ABI on the right of 0.71 with  triphasic waveforms on the left at 0.88 with triphasic and biphasic waveforms. TBI's on the right and 0.44 and on the left at 1.05. 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 11//20; x-rays I did last week were both negative for osteomyelitis. She has a fairly large wound at the base of her left first toe and a small punched out area on the right first toe. We use silver alginate last week 03/13/2019 upon evaluation today patient appears to be doing okay with regard to her wounds at this point. She does have some callus buildup noted upon evaluation at this point. Fortunately there is no evidence of active infection which is also good news. I am going to have to perform some debridement to clear away some of the necrotic tissue today. 03/25/2019 on evaluation today patient appears to be doing well with regard to her foot ulcers. She has been tolerating the dressing changes without complication. Fortunately there is no signs of active infection at this time. Her left foot ulcer actually seems to be doing excellent no debridement even necessary today I am good have to perform some debridement on the right great toe. 04/08/19 on evaluation today patient actually appears to be doing well with regard to her wounds. In fact on the right this appears to be completely healed on the left this is measuring smaller although there still like callous around the edges of the wound. Fortunately there's no evidence of active infection at this time there is some  hyper granulation. 04/15/2019 on evaluation today patient actually appears to be doing well with regard to her toe ulcer. This seems to be showing signs of excellent granulation there is minimal slough/biofilm on the surface of the wound. She does have a significant amount of callus around the edges of the wound but at the same time I feel like that this is something we can easily pared down without any complication. Fortunately there is no evidence of active infection at this point. No fevers, chills, nausea, vomiting, or diarrhea. 04/22/2019 on evaluation today patient appears to be doing somewhat better in regard to her wound. She has been tolerating the dressing changes without complication. There is some callus noted at this point this can require some sharp debridement which I discussed with the patient as well. We will go ahead and proceed with debridement today to try to clear away some of this necrotic callus as well as clean off the biofilm/slough from the surface of the wound. Melinda [name], Melinda [name] ([id number]) 12/29-Patient returns at 1 week with regards to her left plantar foot wound which seems to be doing well, the callus was debrided around the wound the last time and seems to be doing much better since. Apparently it standing smaller, patient is a little discomforted by having to come every week to the clinic but she agrees to do that 05/06/19 on evaluation today patient actually appears to be doing well overall with regard to her plantar foot ulcer. She does have some callous buildup today but nonetheless this does not appear to be showing any signs of active infection at this time which is great news. The base of the wound does seem to be much healthier than what it was last time I saw her. No fevers, chills, nausea, or vomiting noted at this time. 05/13/2019 on evaluation today patient appears to be doing well  with regard to her plantar foot ulcer. She has been tolerating the dressing  changes without complication. In fact I am not even sure there is anything that is going require sharp debridement at this point today which is also good news. Fortunately there is no signs of active infection at this time. No fevers, chills, nausea, vomiting, or diarrhea. 05/19/2018 upon evaluation today patient actually appears to be doing excellent in regard to her wound on the plantar foot. She has been tolerating the dressing changes without complication. Fortunately there is no signs of active infection at this time which is good news. No fevers, chills, nausea, vomiting, or diarrhea. 05/27/2019 upon evaluation today patient appears to be doing excellent in regard to her foot ulcer. She has been tolerating the dressing changes without complication. Fortunately there is no evidence of active infection at this time which is good news. Overall she seems to be showing signs of excellent epithelization which is also excellent news. Electronic Signature(s) Signed: 05/27/2019 11:44:16 AM By: [name] [name] Entered By: [name] on 05/27/2019 11:44:16 Melinda [name] ([id number]) -------------------------------------------------------------------------------- Physical Exam Details Patient Name: Melinda [name] Date of Service: 05/27/2019 10:15 AM Medical Record Number: [id number] Patient Account Number: [id number] Date of Birth/Sex: 08/24/1955 (64 y.o. F) Treating RN: [name] Primary Care Provider: [name] Other Clinician: Referring Provider: [name] Treating Provider/Extender: [name], Kamauri Kathol Weeks in Treatment: 12 Constitutional Obese and well-hydrated in no acute distress. Respiratory normal breathing without difficulty. Psychiatric this patient is able to make decisions and demonstrates good insight into disease process. Alert and Oriented x 3. pleasant and cooperative. Notes Patient's wound bed on evaluation today again did not require sharp debridement  significantly compared to past although there was some callus and minimal slough and biofilm buildup that did need to be addressed. That was actually performed today. She does question me as to whether or not she can just take care of this at home more on her own. I think she is doing well I think we can definitely switch to going to every 2-week visits but I still would like to keep an eye on things especially in light of the fact that we have to keep the callus pared down and the surface of the wound clean I do think it has been quite as often but nonetheless every other week may definitely be still recommended. Electronic Signature(s) Signed: 05/27/2019 11:44:58 AM By: [name] [name] Entered By: [name] on 05/27/2019 11:44:57 Melinda [name] ([id number]) -------------------------------------------------------------------------------- Physician Orders Details Patient Name: Melinda [name] Date of Service: 05/27/2019 10:15 AM Medical Record Number: [id number] Patient Account Number: [id number] Date of Birth/Sex: [birth year] (64 y.o. F) Treating RN: Army Melia Primary Care Provider: [name] Other Clinician: Referring Provider: [name] Treating Provider/Extender: [name], Wyndi Northrup Weeks in Treatment: 12 Verbal / Phone Orders: No Diagnosis Coding ICD-10 Coding Code Description E11.621 Type 2 diabetes mellitus with foot ulcer E11.51 Type 2 diabetes mellitus with diabetic peripheral angiopathy without gangrene 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sed S81.802A Unspecified open wound, left lower leg, initial encounter E11.42 Type 2 diabetes mellitus with diabetic polyneuropathy Wound Cleansing Wound #1 Left,Plantar Toe Great o Cleanse wound with mild soap and water o No tub bath. Anesthetic (add to Medication List) Wound #1 Left,Plantar Toe Great o Topical Lidocaine 4% cream applied to wound bed prior to debridement  (In Clinic Only). Primary Wound Dressing Wound #1 Left,Plantar  Toe Great o Silver Alginate Secondary Dressing Wound #1 Left,Plantar Toe Great o Gauze and Kerlix/Conform o Foam Dressing Change Frequency Wound #1 Left,Plantar Toe Great o Change dressing every other day. Follow-up Appointments o Return Appointment in 2 weeks. Edema Control Wound #1 Left,Plantar Toe Great o Elevate legs to the level of the heart and pump ankles as often as possible Off-Loading Wound #1 Left,Plantar Toe Great o Open toe surgical shoe with peg assist. ELANDRA, ZANGRILLI E. ([id number]) Additional Orders / Instructions o Stop Smoking o Increase protein intake. Electronic Signature(s) Signed: 05/27/2019 11:51:12 AM By: [name] [name] Signed: 05/27/2019 4:47:52 PM By: Army Melia Entered By: Army Melia on 05/27/2019 11:13:54 Melinda [name] ([id number]) -------------------------------------------------------------------------------- Problem List Details Patient Name: RIONNA, GLEED. Date of Service: 05/27/2019 10:15 AM Medical Record Number: [id number] Patient Account Number: [id number] Date of Birth/Sex: [birth year] (64 y.o. F) Treating RN: [name] Primary Care Provider: [name] Other Clinician: Referring Provider: [name] Treating Provider/Extender: [name], Dickson Kostelnik Weeks in Treatment: 12 Active Problems ICD-10 Evaluated Encounter Code Description Active Date Today Diagnosis E11.621 Type 2 diabetes mellitus with foot ulcer 02/26/2019 No Yes E11.51 Type 2 diabetes mellitus with diabetic peripheral angiopathy 02/26/2019 No Yes without gangrene L97.522 Non-pressure chronic ulcer of other part of left foot with fat 02/26/2019 No Yes layer exposed S81.802A Unspecified open wound, left lower leg, initial encounter 04/15/2019 No Yes E11.42 Type 2 diabetes mellitus with diabetic polyneuropathy 02/26/2019 No Yes Inactive Problems Resolved Problems ICD-10 Code  Description Active Date Resolved Date L97.512 Non-pressure chronic ulcer of other part of right foot with fat layer 02/26/2019 02/26/2019 exposed Electronic Signature(s) Signed: 05/27/2019 10:00:42 AM By: [name] [name] Entered By: [name] on 05/27/2019 10:00:41 Melinda [name] ([id number]) -------------------------------------------------------------------------------- Progress Note Details Patient Name: Melinda [name] Date of Service: 05/27/2019 10:15 AM Medical Record Number: [id number] Patient Account Number: [id number] Date of Birth/Sex: 12-02-1955 (64 y.o. F) Treating RN: [name] Primary Care Provider: [name] Other Clinician: Referring Provider: [name] Treating Provider/Extender: [name], Arianah Torgeson Weeks in Treatment: 12 Subjective Chief Complaint Information obtained from Patient Left great toe ulcer and left LE skin tear History of Present Illness (HPI) ADMISSION 02/26/2019 Patient is a 64 year old type II diabetic on insulin with significant polyneuropathy. She has been followed by Dr. Sherren Mocha cline of podiatry for problems related to her feet dating back to the early part of 2019 as I can review in [place] link. 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 Subsequently she was seen by Dr. Lucky Cowboy of vascular surgery and had stents x2 placed in her left SFA as well as left SFA angioplasties on 05/20/2017. She was noted to have a wound on her left foot in October 2019. In August 2020 on 8/24 she underwent a right anterior tibial artery angioplasty a right tibioperoneal trunk angioplasty and a right SFA angioplasty. The patient states that she developed a left great toe wound in August which is at the base of her previous partial amputation in this area. She tells [name] that she has had a right great toe wound since December 2019 and she has been using Santyl to both of these areas that she received from a fellow  parishioner at her church. By enlarge she has been using Neosporin to these areas and not offloading them specifically Arterial studies on 9/22 showed an ABI on the right of 0.71 with triphasic waveforms on the left at 0.88  with triphasic and biphasic waveforms. TBI's on the right and 0.44 and on the left at 1.05. 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 11//20; x-rays I did last week were both negative for osteomyelitis. She has a fairly large wound at the base of her left first toe and a small punched out area on the right first toe. We use silver alginate last week 03/13/2019 upon evaluation today patient appears to be doing okay with regard to her wounds at this point. She does have some callus buildup noted upon evaluation at this point. Fortunately there is no evidence of active infection which is also good news. I am going to have to perform some debridement to clear away some of the necrotic tissue today. 03/25/2019 on evaluation today patient appears to be doing well with regard to her foot ulcers. She has been tolerating the dressing changes without complication. Fortunately there is no signs of active infection at this time. Her left foot ulcer actually seems to be doing excellent no debridement even necessary today I am good have to perform some debridement on the right great toe. 04/08/19 on evaluation today patient actually appears to be doing well with regard to her wounds. In fact on the right this appears to be completely healed on the left this is measuring smaller although there still like callous around the edges of the wound. Fortunately there's no evidence of active infection at this time there is some hyper granulation. 04/15/2019 on evaluation today patient actually appears to be doing well with regard to her  toe ulcer. This seems to be showing signs of excellent granulation there is minimal slough/biofilm on the surface of the wound. She does have a significant amount of callus around the edges of the wound but at the same time I feel like that this is something we can easily pared down without any complication. Fortunately there is no evidence of active infection at this point. No fevers, chills, nausea, vomiting, or diarrhea. Melinda [name], Melinda [name] ([id number]) 04/22/2019 on evaluation today patient appears to be doing somewhat better in regard to her wound. She has been tolerating the dressing changes without complication. There is some callus noted at this point this can require some sharp debridement which I discussed with the patient as well. We will go ahead and proceed with debridement today to try to clear away some of this necrotic callus as well as clean off the biofilm/slough from the surface of the wound. 12/29-Patient returns at 1 week with regards to her left plantar foot wound which seems to be doing well, the callus was debrided around the wound the last time and seems to be doing much better since. Apparently it standing smaller, patient is a little discomforted by having to come every week to the clinic but she agrees to do that 05/06/19 on evaluation today patient actually appears to be doing well overall with regard to her plantar foot ulcer. She does have some callous buildup today but nonetheless this does not appear to be showing any signs of active infection at this time which is great news. The base of the wound does seem to be much healthier than what it was last time I saw her. No fevers, chills, nausea, or vomiting noted at this time. 05/13/2019 on evaluation today patient appears to be doing well with regard to her plantar foot ulcer.  She has been tolerating the dressing changes without complication. In fact I am not even sure there is anything that is going require  sharp debridement at this point today which is also good news. Fortunately there is no signs of active infection at this time. No fevers, chills, nausea, vomiting, or diarrhea. 05/19/2018 upon evaluation today patient actually appears to be doing excellent in regard to her wound on the plantar foot. She has been tolerating the dressing changes without complication. Fortunately there is no signs of active infection at this time which is good news. No fevers, chills, nausea, vomiting, or diarrhea. 05/27/2019 upon evaluation today patient appears to be doing excellent in regard to her foot ulcer. She has been tolerating the dressing changes without complication. Fortunately there is no evidence of active infection at this time which is good news. Overall she seems to be showing signs of excellent epithelization which is also excellent news. Objective Constitutional Obese and well-hydrated in no acute distress. Vitals Time Taken: 10:29 AM, Height: 68 in, Weight: 300 lbs, BMI: 45.6, Temperature: 99.5 F, Pulse: 91 bpm, Respiratory Rate: 16 breaths/min, Blood Pressure: 106/58 mmHg. Respiratory normal breathing without difficulty. Psychiatric this patient is able to make decisions and demonstrates good insight into disease process. Alert and Oriented x 3. pleasant and cooperative. General Notes: Patient's wound bed on evaluation today again did not require sharp debridement significantly compared to past although there was some callus and minimal slough and biofilm buildup that did need to be addressed. That was actually performed today. She does question me as to whether or not she can just take care of this at home more on her own. I think she is doing well I think we can definitely switch to going to every 2-week visits but I still would like to keep an eye on things especially in light of the fact that we have to keep the callus pared down and the surface of the wound clean I do think it has been  quite as often but nonetheless every other week may definitely be still recommended. Melinda [name], Melinda [name] Kitchen ([id number]) Integumentary (Hair, Skin) Wound #1 status is Open. Original cause of wound was Gradually Appeared. The wound is located on the [employer]. The wound measures 0.7cm length x 0.7cm width x 0.2cm depth; 0.385cm^2 area and 0.077cm^3 volume. There is Fat Layer (Subcutaneous Tissue) Exposed exposed. There is no tunneling or undermining noted. There is a medium amount of serosanguineous drainage noted. The wound margin is epibole. There is large (67-100%) pale granulation within the wound bed. There is a small (1-33%) amount of necrotic tissue within the wound bed including Adherent Slough. Assessment Active Problems ICD-10 Type 2 diabetes mellitus with foot ulcer Type 2 diabetes mellitus with diabetic peripheral angiopathy without gangrene Non-pressure chronic ulcer of other part of left foot with fat layer exposed Unspecified open wound, left lower leg, initial encounter Type 2 diabetes mellitus with diabetic polyneuropathy Procedures Wound #1 Pre-procedure diagnosis of Wound #1 is a Diabetic Wound/Ulcer of the Lower Extremity located on the Left,Plantar Toe Great .Severity of Tissue Pre Debridement is: Fat layer exposed. There was a Excisional Skin/Subcutaneous Tissue Debridement with a total area of 0.49 sq cm performed by [name], Lutie Pickler E., [name]. With the following instrument(s): Curette to remove Viable and Non-Viable tissue/material. Material removed includes Callus, Subcutaneous Tissue, and Slough after achieving pain control using Lidocaine. A time out was conducted at 11:10, prior to the start of the procedure. A Minimum amount of  bleeding was controlled with Pressure. The procedure was tolerated well. Post Debridement Measurements: 0.7cm length x 0.7cm width x 0.2cm depth; 0.077cm^3 volume. Character of Wound/Ulcer Post Debridement is stable. Severity of Tissue  Post Debridement is: Fat layer exposed. Post procedure Diagnosis Wound #1: Same as Pre-Procedure Plan Wound Cleansing: Wound #1 Left,Plantar Toe Great: Cleanse wound with mild soap and water No tub bath. Anesthetic (add to Medication List): Wound #1 Left,Plantar Toe Great: Topical Lidocaine 4% cream applied to wound bed prior to debridement (In Clinic Only). Primary Wound Dressing: Wound #1 Left,Plantar Toe Great: Silver Alginate Secondary Dressing: Melinda [name], Melinda [name] ([id number]) Wound #1 Left,Plantar Toe Great: Gauze and Kerlix/Conform Foam Dressing Change Frequency: Wound #1 Left,Plantar Toe Great: Change dressing every other day. Follow-up Appointments: Return Appointment in 2 weeks. Edema Control: Wound #1 Left,Plantar Toe Great: Elevate legs to the level of the heart and pump ankles as often as possible Off-Loading: Wound #1 Left,Plantar Toe Great: Open toe surgical shoe with peg assist. Additional Orders / Instructions: Stop Smoking Increase protein intake. 1. My suggestion at this time is good to be that we continue with the current wound care measures which includes the silver collagen to the wound bed. 2. I am also going to suggest that we continue to monitor for any infection as well as at least every other week evaluations to clear away callus and any slough or biofilm buildup that may occur. The patient is in agreement with that plan. 3. She will also continue to utilize the PEG assist offloading shoe which does seem to be beneficial for her as well. We will see patient back for reevaluation in 1 week here in the clinic. If anything worsens or changes patient will contact our office for additional recommendations. Electronic Signature(s) Signed: 05/27/2019 11:46:07 AM By: [name] [name] Entered By: [name] on 05/27/2019 11:46:06 Melinda [name] ([id number]) -------------------------------------------------------------------------------- SuperBill  Details Patient Name: Melinda [name] Date of Service: 05/27/2019 Medical Record Number: [id number] Patient Account Number: [id number] Date of Birth/Sex: Jan 05, 1956 (64 y.o. F) Treating RN: [name] Primary Care Provider: [name] Other Clinician: Referring Provider: [name] Treating Provider/Extender: [name], Bonifacio Pruden Weeks in Treatment: 12 Diagnosis Coding ICD-10 Codes Code Description E11.621 Type 2 diabetes mellitus with foot ulcer E11.51 Type 2 diabetes mellitus with diabetic peripheral angiopathy without gangrene 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sed S81.802A Unspecified open wound, left lower leg, initial encounter E11.42 Type 2 diabetes mellitus with diabetic polyneuropathy Facility Procedures CPT4 Code: [id number] Description: [id number] - DEB SUBQ TISSUE 20 SQ CM/< ICD-10 Diagnosis Description L97.522 Non-pressure chronic ulcer of other part of left foot with fat Modifier: layer exposed Quantity: 1 Physician Procedures CPT4 Code: [id number] Description: [id number] - WC PHYS SUBQ TISS 20 SQ CM ICD-10 Diagnosis Description L97.522 Non-pressure chronic ulcer of other part of left foot with fat Modifier: layer exposed Quantity: 1 Electronic Signature(s) Signed: 05/27/2019 11:46:17 AM By: [name] [name] Entered By: [name] on 05/27/2019 11:46:16

## 2019-06-02 ENCOUNTER — Other Ambulatory Visit: Payer: Self-pay | Admitting: Cardiovascular Disease

## 2019-06-02 DIAGNOSIS — E1159 Type 2 diabetes mellitus with other circulatory complications: Secondary | ICD-10-CM

## 2019-06-03 ENCOUNTER — Encounter: Payer: Medicare HMO | Admitting: Physician Assistant

## 2019-06-10 ENCOUNTER — Ambulatory Visit: Payer: Medicare HMO | Admitting: Physician Assistant

## 2019-06-13 ENCOUNTER — Encounter: Payer: Medicare HMO | Attending: Physician Assistant | Admitting: Physician Assistant

## 2019-06-13 ENCOUNTER — Other Ambulatory Visit: Payer: Self-pay

## 2019-06-13 DIAGNOSIS — L84 Corns and callosities: Secondary | ICD-10-CM | POA: Insufficient documentation

## 2019-06-13 DIAGNOSIS — S81802A Unspecified open wound, left lower leg, initial encounter: Secondary | ICD-10-CM | POA: Diagnosis not present

## 2019-06-13 DIAGNOSIS — E11621 Type 2 diabetes mellitus with foot ulcer: Secondary | ICD-10-CM | POA: Insufficient documentation

## 2019-06-13 DIAGNOSIS — Z89422 Acquired absence of other left toe(s): Secondary | ICD-10-CM | POA: Diagnosis not present

## 2019-06-13 DIAGNOSIS — X58XXXA Exposure to other specified factors, initial encounter: Secondary | ICD-10-CM | POA: Insufficient documentation

## 2019-06-13 DIAGNOSIS — E1142 Type 2 diabetes mellitus with diabetic polyneuropathy: Secondary | ICD-10-CM | POA: Diagnosis not present

## 2019-06-13 DIAGNOSIS — Z951 Presence of aortocoronary bypass graft: Secondary | ICD-10-CM | POA: Diagnosis not present

## 2019-06-13 DIAGNOSIS — Z89412 Acquired absence of left great toe: Secondary | ICD-10-CM | POA: Diagnosis not present

## 2019-06-13 DIAGNOSIS — Z89421 Acquired absence of other right toe(s): Secondary | ICD-10-CM | POA: Insufficient documentation

## 2019-06-13 DIAGNOSIS — F17200 Nicotine dependence, unspecified, uncomplicated: Secondary | ICD-10-CM | POA: Diagnosis not present

## 2019-06-13 DIAGNOSIS — E1151 Type 2 diabetes mellitus with diabetic peripheral angiopathy without gangrene: Secondary | ICD-10-CM | POA: Diagnosis not present

## 2019-06-13 DIAGNOSIS — L97522 Non-pressure chronic ulcer of other part of left foot with fat layer exposed: Secondary | ICD-10-CM | POA: Insufficient documentation

## 2019-06-13 NOTE — Progress Notes (Addendum)
AREYON, ENGSTRAND ([id number]) Visit Report for 06/13/2019 Chief Complaint Document Details Patient Name: Melinda [name], Melinda [name] Date of Service: 06/13/2019 2:45 PM Medical Record Number: [id number] Patient Account Number: [id number] Date of Birth/Sex: 19-May-1955 (64 y.o. F) Treating RN: [name] Primary Care Provider: [name] Other Clinician: Referring Provider: [name] Treating Provider/Extender: [name], Eduarda Scrivens Weeks in Treatment: 15 Information Obtained from: Patient Chief Complaint Left great toe ulcer and left LE skin tear Electronic Signature(s) Signed: 06/13/2019 2:26:55 PM By: [name] [name] Entered By: [name] on 06/13/2019 14:26:55 Melinda [name] ([id number]) -------------------------------------------------------------------------------- Debridement Details Patient Name: Melinda [name] Date of Service: 06/13/2019 2:45 PM Medical Record Number: [id number] Patient Account Number: [id number] Date of Birth/Sex: 1955-11-17 (64 y.o. F) Treating RN: [name] Primary Care Provider: [name] Other Clinician: Referring Provider: [name] Treating Provider/Extender: [name], Rudi Knippenberg Weeks in Treatment: 15 Debridement Performed for Wound #1 Left,Plantar Toe Great Assessment: Performed By: Physician [name], Vibha Ferdig E., [name] Debridement Type: Debridement Severity of Tissue Pre Fat layer exposed Debridement: Level of Consciousness (Pre- Awake and Alert procedure): Pre-procedure Verification/Time Yes - 14:33 Out Taken: Start Time: 14:33 Pain Control: Lidocaine 4% Topical Solution Total Area Debrided (L x W): 0.7 (cm) x 0.6 (cm) = 0.42 (cm) Tissue and other material Viable, Non-Viable, Callus, Slough, Subcutaneous, Slough debrided: Level: Skin/Subcutaneous Tissue Debridement Description: Excisional Instrument: Curette Bleeding: Minimum Hemostasis Achieved: Pressure End Time: 14:35 Procedural Pain: 0 Post Procedural Pain: 0 Response  to Treatment: Procedure was tolerated well Level of Consciousness Awake and Alert (Post-procedure): Post Debridement Measurements of Total Wound Length: (cm) 0.7 Width: (cm) 0.6 Depth: (cm) 0.3 Volume: (cm) 0.099 Character of Wound/Ulcer Post Debridement: Improved Severity of Tissue Post Debridement: Fat layer exposed Post Procedure Diagnosis Same as Pre-procedure Electronic Signature(s) Signed: 06/13/2019 3:03:45 PM By: [name] Signed: 06/13/2019 3:41:32 PM By: [name] [name] Entered By: [name] on 06/13/2019 14:37:57 Melinda [name] ([id number]) -------------------------------------------------------------------------------- HPI Details Patient Name: Melinda [name] Date of Service: 06/13/2019 2:45 PM Medical Record Number: [id number] Patient Account Number: [id number] Date of Birth/Sex: 1956-04-30 (64 y.o. 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 She has been followed by Dr. Sherren Mocha cline of podiatry for problems related to her feet dating back to the early part of 2019 as I can review in [place] link. 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 Subsequently she was seen by Dr. Lucky Cowboy of vascular surgery and had stents x2 placed in her left SFA as well as left SFA angioplasties on 05/20/2017. She was noted to have a wound on her left foot in October 2019. In August 2020 on 8/24 she underwent a right anterior tibial artery angioplasty a right tibioperoneal trunk angioplasty and a right SFA angioplasty. The patient states that she developed a left great toe wound in August which is at the base of her previous partial  amputation in this area. She tells [name] that she has had a right great toe wound since December 2019 and she has been using Santyl to both of these areas that she received from a fellow parishioner at her church. By enlarge she has been using Neosporin to these areas and not offloading them specifically Arterial studies on  9/22 showed an ABI on the right of 0.71 with triphasic waveforms on the left at 0.88 with triphasic and biphasic waveforms. TBI's on the right and 0.44 and on the left at 1.05. 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 11//20; x-rays I did last week were both negative for osteomyelitis. She has a fairly large wound at the base of her left first toe and a small punched out area on the right first toe. We use silver alginate last week 03/13/2019 upon evaluation today patient appears to be doing okay with regard to her wounds at this point. She does have some callus buildup noted upon evaluation at this point. Fortunately there is no evidence of active infection which is also good news. I am going to have to perform some debridement to clear away some of the necrotic tissue today. 03/25/2019 on evaluation today patient appears to be doing well with regard to her foot ulcers. She has been tolerating the dressing changes without complication. Fortunately there is no signs of active infection at this time. Her left foot ulcer actually seems to be doing excellent no debridement even necessary today I am good have to perform some debridement on the right great toe. 04/08/19 on evaluation today patient actually appears to be doing well with regard to her wounds. In fact on the right this appears to be completely healed on the left this is measuring smaller although there still like callous around the edges of the wound.  Fortunately there's no evidence of active infection at this time there is some hyper granulation. 04/15/2019 on evaluation today patient actually appears to be doing well with regard to her toe ulcer. This seems to be showing signs of excellent granulation there is minimal slough/biofilm on the surface of the wound. She does have a significant amount of callus around the edges of the wound but at the same time I feel like that this is something we can easily pared down without any complication. Fortunately there is no evidence of active infection at this point. No fevers, chills, nausea, vomiting, or diarrhea. 04/22/2019 on evaluation today patient appears to be doing somewhat better in regard to her wound. She has been tolerating the dressing changes without complication. There is some callus noted at this point this can require some sharp debridement which I discussed with the patient as well. We will go ahead and proceed with debridement today to try to clear away some of this necrotic callus as well as clean off the biofilm/slough from the surface of the wound. LASHANIQUE, GARCIAGARCIA ([id number]) 12/29-Patient returns at 1 week with regards to her left plantar foot wound which seems to be doing well, the callus was debrided around the wound the last time and seems to be doing much better since. Apparently it standing smaller, patient is a little discomforted by having to come every week to the clinic but she agrees to do that 05/06/19 on evaluation today patient actually appears to be doing well overall with regard to her plantar foot ulcer. She does have some callous buildup today but nonetheless this does not appear to be showing any signs of active infection at this time which is great news. The base of the wound does seem to be much healthier than what it was last time I saw her. No fevers, chills, nausea, or vomiting noted at this time.  05/13/2019 on evaluation today patient appears to be doing well  with regard to her plantar foot ulcer. She has been tolerating the dressing changes without complication. In fact I am not even sure there is anything that is going require sharp debridement at this point today which is also good news. Fortunately there is no signs of active infection at this time. No fevers, chills, nausea, vomiting, or diarrhea. 05/19/2018 upon evaluation today patient actually appears to be doing excellent in regard to her wound on the plantar foot. She has been tolerating the dressing changes without complication. Fortunately there is no signs of active infection at this time which is good news. No fevers, chills, nausea, vomiting, or diarrhea. 05/27/2019 upon evaluation today patient appears to be doing excellent in regard to her foot ulcer. She has been tolerating the dressing changes without complication. Fortunately there is no evidence of active infection at this time which is good news. Overall she seems to be showing signs of excellent epithelization which is also excellent news. 06/13/2019 upon evaluation today patient appears to be doing well with regard to her left plantar foot ulcer. She has been tolerating the dressing changes without complication. Fortunately there is no signs of active infection at this time. She does not seem to be having too much drainage at this point which is also excellent news. Overall very pleased with how things have progressed. She does have a lot of callus on the right great toe but this does not seem to be 80 whereas significant as what were dealing with on the left. In fact the toe actually appears to be still healed as far as I am aware. There is no signs of active infection at this time. She does want to see if I can pare away some of the callus which I think is definitely something I can do for her today. Electronic Signature(s) Signed: 06/13/2019 2:58:39 PM By: [name] [name] Entered By: [name] on 06/13/2019  14:58:38 Melinda [name] ([id number]) -------------------------------------------------------------------------------- Callus Pairing Details Patient Name: Melinda [name] Date of Service: 06/13/2019 2:45 PM Medical Record Number: [id number] Patient Account Number: [id number] Date of Birth/Sex: May 11, 1955 (64 y.o. F) Treating RN: [name] Primary Care Provider: [name] Other Clinician: Referring Provider: [name] Treating Provider/Extender: [name], Clara Smolen Weeks in Treatment: 15 Procedure Performed for: NonWound Condition Other Dermatologic Condition - Right Foot Performed By: Physician [name], Rachele Lamaster E., [name] Post Procedure Diagnosis Same as Pre-procedure Notes 3 curette used to pair callus on right foot, great toe Electronic Signature(s) Signed: 06/13/2019 3:03:45 PM By: [name] Entered By: [name] on 06/13/2019 14:39:42 Melinda [name] ([id number]) -------------------------------------------------------------------------------- Physical Exam Details Patient Name: Melinda [name] Date of Service: 06/13/2019 2:45 PM Medical Record Number: [id number] Patient Account Number: [id number] Date of Birth/Sex: Sep 18, 1955 (64 y.o. F) Treating RN: [name] Primary Care Provider: [name] Other Clinician: Referring Provider: [name] Treating Provider/Extender: [name], Renella Steig Weeks in Treatment: 57 Constitutional Well-nourished and well-hydrated in no acute distress. Respiratory normal breathing without difficulty. Psychiatric this patient is able to make decisions and demonstrates good insight into disease process. Alert and Oriented x 3. pleasant and cooperative. Notes Patient's wound bed currently upon evaluation showed signs of good epithelization around the edges of the wound she did have some callus that required sharp debridement today. I did perform debridement to clear this away and she had no complications there was some slough and  biofilm on the surface of the  wound which was also cleared away. With regard to the right great toe I did perform callus paring here there was no open wound underlying which is great news and I was able to remove the callus down to good tissue and again there really was nothing open at this point just good skin. Electronic Signature(s) Signed: 06/13/2019 3:00:28 PM By: [name] [name] Entered By: [name] on 06/13/2019 15:00:28 Melinda [name] ([id number]) -------------------------------------------------------------------------------- Physician Orders Details Patient Name: Melinda [name] Date of Service: 06/13/2019 2:45 PM Medical Record Number: [id number] Patient Account Number: [id number] Date of Birth/Sex: 1956-04-17 (64 y.o. F) Treating RN: [name] Primary Care Provider: [name] Other Clinician: Referring Provider: [name] Treating Provider/Extender: [name], Elysha Daw Weeks in Treatment: 15 Verbal / Phone Orders: No Diagnosis Coding ICD-10 Coding Code Description E11.621 Type 2 diabetes mellitus with foot ulcer E11.51 Type 2 diabetes mellitus with diabetic peripheral angiopathy without gangrene 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sed S81.802A Unspecified open wound, left lower leg, initial encounter E11.42 Type 2 diabetes mellitus with diabetic polyneuropathy Wound Cleansing Wound #1 Left,Plantar Toe Great o Cleanse wound with mild soap and water o No tub bath. Anesthetic (add to Medication List) Wound #1 Left,Plantar Toe Great o Topical Lidocaine 4% cream applied to wound bed prior to debridement (In Clinic Only). Primary Wound Dressing Wound #1 Left,Plantar Toe Great o Silver Collagen Secondary Dressing Wound #1 Left,Plantar Toe Great o Gauze and Kerlix/Conform o Foam Dressing Change Frequency Wound #1 Left,Plantar Toe Great o Change dressing every other day. Follow-up  Appointments o Return Appointment in 2 weeks. Edema Control Wound #1 Left,Plantar Toe Great o Elevate legs to the level of the heart and pump ankles as often as possible Off-Loading Wound #1 Left,Plantar Toe Great o Open toe surgical shoe with peg assist. Melinda [name], Melinda [name] E. ([id number]) Additional Orders / Instructions o Stop Smoking o Increase protein intake. Electronic Signature(s) Signed: 06/13/2019 3:03:45 PM By: [name] Signed: 06/13/2019 3:41:32 PM By: [name] [name] Entered By: [name] on 06/13/2019 14:41:25 Melinda [name] ([id number]) -------------------------------------------------------------------------------- Problem List Details Patient Name: Melinda [name], Melinda [name] Date of Service: 06/13/2019 2:45 PM Medical Record Number: [id number] Patient Account Number: [id number] Date of Birth/Sex: [birth year] (64 y.o. F) Treating RN: [name] Primary Care Provider: [name] Other Clinician: Referring Provider: [name] Treating Provider/Extender: [name], Mackinsey Pelland Weeks in Treatment: 15 Active Problems ICD-10 Evaluated Encounter Code Description Active Date Today Diagnosis E11.621 Type 2 diabetes mellitus with foot ulcer 02/26/2019 No Yes E11.51 Type 2 diabetes mellitus with diabetic peripheral angiopathy 02/26/2019 No Yes without gangrene L97.522 Non-pressure chronic ulcer of other part of left foot with fat 02/26/2019 No Yes layer exposed S81.802A Unspecified open wound, left lower leg, initial encounter 04/15/2019 No Yes E11.42 Type 2 diabetes mellitus with diabetic polyneuropathy 02/26/2019 No Yes L84 Corns and callosities 06/13/2019 No Yes Inactive Problems Resolved Problems ICD-10 Code Description Active Date Resolved Date L97.512 Non-pressure chronic ulcer of other part of right foot with fat layer 02/26/2019 02/26/2019 exposed Electronic Signature(s) Signed: 06/13/2019 2:47:57 PM By: [name] [name] Previous Signature:  06/13/2019 2:26:48 PM Version By: Lorrine Kin, Saara E. ([id number]) Entered By: [name] on 06/13/2019 14:47:56 Melinda [name] ([id number]) -------------------------------------------------------------------------------- Progress Note Details Patient Name: Melinda [name] Date of Service: 06/13/2019 2:45 PM Medical Record Number: [id number] Patient Account Number: [id number] Date of Birth/Sex: 03/04/1956 (64 y.o. 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 She has been followed by Dr. Sherren Mocha cline of podiatry for problems related to her feet dating back to the early part of 2019 as I can review in [place] link. 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 Subsequently she was seen by Dr. Lucky Cowboy of vascular surgery and had stents x2 placed in her left SFA as well as left SFA angioplasties on 05/20/2017. She was noted to have a wound on her left foot in October 2019. In August 2020 on 8/24 she underwent a right anterior tibial artery angioplasty a right tibioperoneal trunk angioplasty and a right SFA angioplasty. The patient states that she developed a left great toe wound in August which is at the base of her previous partial amputation in this area. She tells [name] that she has had a right great toe wound since December 2019 and she has been using Santyl to both of these areas that she received from a fellow parishioner at her church. By enlarge she has been using Neosporin to these areas and not offloading them specifically Arterial studies on  9/22 showed an ABI on the right of 0.71 with triphasic waveforms on the left at 0.88 with triphasic and biphasic waveforms. TBI's on the right and 0.44 and on the left at 1.05. 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 11//20; x-rays I did last week were both negative for osteomyelitis. She has a fairly large wound at the base of her left first toe and a small punched out area on the right first toe. We use silver alginate last week 03/13/2019 upon evaluation today patient appears to be doing okay with regard to her wounds at this point. She does have some callus buildup noted upon evaluation at this point. Fortunately there is no evidence of active infection which is also good news. I am going to have to perform some debridement to clear away some of the necrotic tissue today. 03/25/2019 on evaluation today patient appears to be doing well with regard to her foot ulcers. She has been tolerating the dressing changes without complication. Fortunately there is no signs of active infection at this time. Her left foot ulcer actually seems to be doing excellent no debridement even necessary today I am good have to perform some debridement on the right great toe. 04/08/19 on evaluation today patient actually appears to be doing well with regard to her wounds. In fact on the right this appears to be completely healed on the left this is measuring smaller although there still like callous around the edges of the wound. Fortunately there's no evidence of active infection at this time there is some hyper granulation. 04/15/2019 on evaluation today patient actually appears to be doing well with regard to her toe ulcer. This seems to be showing signs of excellent granulation there is minimal slough/biofilm on the surface of the wound. She does have  a significant amount of callus around the edges of the wound but at the same time I feel like that this is  something we can easily pared down without any complication. Fortunately there is no evidence of active infection at this point. No fevers, chills, nausea, vomiting, or diarrhea. Melinda [name], Melinda [name] ([id number]) 04/22/2019 on evaluation today patient appears to be doing somewhat better in regard to her wound. She has been tolerating the dressing changes without complication. There is some callus noted at this point this can require some sharp debridement which I discussed with the patient as well. We will go ahead and proceed with debridement today to try to clear away some of this necrotic callus as well as clean off the biofilm/slough from the surface of the wound. 12/29-Patient returns at 1 week with regards to her left plantar foot wound which seems to be doing well, the callus was debrided around the wound the last time and seems to be doing much better since. Apparently it standing smaller, patient is a little discomforted by having to come every week to the clinic but she agrees to do that 05/06/19 on evaluation today patient actually appears to be doing well overall with regard to her plantar foot ulcer. She does have some callous buildup today but nonetheless this does not appear to be showing any signs of active infection at this time which is great news. The base of the wound does seem to be much healthier than what it was last time I saw her. No fevers, chills, nausea, or vomiting noted at this time. 05/13/2019 on evaluation today patient appears to be doing well with regard to her plantar foot ulcer. She has been tolerating the dressing changes without complication. In fact I am not even sure there is anything that is going require sharp debridement at this point today which is also good news. Fortunately there is no signs of active infection at this time. No fevers, chills, nausea,  vomiting, or diarrhea. 05/19/2018 upon evaluation today patient actually appears to be doing excellent in regard to her wound on the plantar foot. She has been tolerating the dressing changes without complication. Fortunately there is no signs of active infection at this time which is good news. No fevers, chills, nausea, vomiting, or diarrhea. 05/27/2019 upon evaluation today patient appears to be doing excellent in regard to her foot ulcer. She has been tolerating the dressing changes without complication. Fortunately there is no evidence of active infection at this time which is good news. Overall she seems to be showing signs of excellent epithelization which is also excellent news. 06/13/2019 upon evaluation today patient appears to be doing well with regard to her left plantar foot ulcer. She has been tolerating the dressing changes without complication. Fortunately there is no signs of active infection at this time. She does not seem to be having too much drainage at this point which is also excellent news. Overall very pleased with how things have progressed. She does have a lot of callus on the right great toe but this does not seem to be 80 whereas significant as what were dealing with on the left. In fact the toe actually appears to be still healed as far as I am aware. There is no signs of active infection at this time. She does want to see if I can pare away some of the callus which I think is definitely something I can do for her today. Objective Constitutional Well-nourished and well-hydrated in no acute distress. Vitals Time Taken: 2:23 PM, Height: 68 in, Weight: 300 lbs, BMI: 45.6, Temperature: 99.3 F, Pulse: 98 bpm, Respiratory Rate:  16 breaths/min, Blood Pressure: 157/57 mmHg. Respiratory normal breathing without difficulty. Psychiatric this patient is able to make decisions and demonstrates good insight into disease process. Alert and Oriented x 3. pleasant and  cooperative. Melinda [name], Melinda [name] ([id number]) General Notes: Patient's wound bed currently upon evaluation showed signs of good epithelization around the edges of the wound she did have some callus that required sharp debridement today. I did perform debridement to clear this away and she had no complications there was some slough and biofilm on the surface of the wound which was also cleared away. With regard to the right great toe I did perform callus paring here there was no open wound underlying which is great news and I was able to remove the callus down to good tissue and again there really was nothing open at this point just good skin. Integumentary (Hair, Skin) Wound #1 status is Open. Original cause of wound was Gradually Appeared. The wound is located on the [employer]. The wound measures 0.7cm length x 0.6cm width x 0.2cm depth; 0.33cm^2 area and 0.066cm^3 volume. There is Fat Layer (Subcutaneous Tissue) Exposed exposed. There is no tunneling or undermining noted. There is a medium amount of serosanguineous drainage noted. The wound margin is epibole. There is large (67-100%) pale granulation within the wound bed. There is a small (1-33%) amount of necrotic tissue within the wound bed including Adherent Slough. Other Condition(s) Patient presents with Other Dermatologic Condition located on the Right Foot. General Notes: patient with callus on right foot Assessment Active Problems ICD-10 Type 2 diabetes mellitus with foot ulcer Type 2 diabetes mellitus with diabetic peripheral angiopathy without gangrene Non-pressure chronic ulcer of other part of left foot with fat layer exposed Unspecified open wound, left lower leg, initial encounter Type 2 diabetes mellitus with diabetic polyneuropathy Corns and callosities Procedures Wound #1 Pre-procedure diagnosis of Wound #1 is a Diabetic Wound/Ulcer of the Lower Extremity located on the Left,Plantar Toe Great .Severity of  Tissue Pre Debridement is: Fat layer exposed. There was a Excisional Skin/Subcutaneous Tissue Debridement with a total area of 0.42 sq cm performed by [name], Stepen Prins E., [name]. With the following instrument(s): Curette to remove Viable and Non-Viable tissue/material. Material removed includes Callus, Subcutaneous Tissue, and Slough after achieving pain control using Lidocaine 4% Topical Solution. No specimens were taken. A time out was conducted at 14:33, prior to the start of the procedure. A Minimum amount of bleeding was controlled with Pressure. The procedure was tolerated well with a pain level of 0 throughout and a pain level of 0 following the procedure. Post Debridement Measurements: 0.7cm length x 0.6cm width x 0.3cm depth; 0.099cm^3 volume. Character of Wound/Ulcer Post Debridement is improved. Severity of Tissue Post Debridement is: Fat layer exposed. Post procedure Diagnosis Wound #1: Same as Pre-Procedure A Callus Pairing procedure was performed. by [name], Bertrand Vowels E., [name]. Post procedure Diagnosis Wound #: Same as Pre-Procedure Notes: 3 curette used to pair callus on right foot, great toe [name], Melinda E. ([id number]) Plan Wound Cleansing: Wound #1 Left,Plantar Toe Great: Cleanse wound with mild soap and water No tub bath. Anesthetic (add to Medication List): Wound #1 Left,Plantar Toe Great: Topical Lidocaine 4% cream applied to wound bed prior to debridement (In Clinic Only). Primary Wound Dressing: Wound #1 Left,Plantar Toe Great: Silver Collagen Secondary Dressing: Wound #1 Left,Plantar Toe Great: Gauze and Kerlix/Conform Foam Dressing Change Frequency: Wound #1 Left,Plantar Toe Great: Change dressing every other day. Follow-up Appointments: Return Appointment in 2 weeks.  Edema Control: Wound #1 Left,Plantar Toe Great: Elevate legs to the level of the heart and pump ankles as often as possible Off-Loading: Wound #1 Left,Plantar Toe Great: Open toe surgical shoe  with peg assist. Additional Orders / Instructions: Stop Smoking Increase protein intake. 1. My suggestion at this time is good to be that we go ahead and continue with the padding on the right foot that seems to be doing well for her I am okay with that currently. 2. I would recommend as well that she continue with the dressing on the left foot although we will switch to silver collagen I think that is can be better than the alginate at this point she does seem to be draining nearly as much. 3. I do recommend she continue to wear the PEG assist surgical shoe which I do think has been helpful for offloading. We will see patient back for reevaluation in 1 week here in the clinic. If anything worsens or changes patient will contact our office for additional recommendations. Electronic Signature(s) Signed: 06/13/2019 3:01:35 PM By: [name] [name] Entered By: [name] on 06/13/2019 15:01:35 Melinda [name] ([id number]) -------------------------------------------------------------------------------- SuperBill Details Patient Name: Melinda [name] Date of Service: 06/13/2019 Medical Record Number: [id number] Patient Account Number: [id number] Date of Birth/Sex: 04/17/1956 (64 y.o. F) Treating RN: [name] Primary Care Provider: [name] Other Clinician: Referring Provider: [name] Treating Provider/Extender: [name], Laquasha Groome Weeks in Treatment: 15 Diagnosis Coding ICD-10 Codes Code Description E11.621 Type 2 diabetes mellitus with foot ulcer E11.51 Type 2 diabetes mellitus with diabetic peripheral angiopathy without gangrene 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sed S81.802A Unspecified open wound, left lower leg, initial encounter E11.42 Type 2 diabetes mellitus with diabetic polyneuropathy L84 Corns and callosities Facility Procedures CPT4 Code: [id number] Description: [id number] - DEB SUBQ TISSUE 20 SQ CM/< ICD-10 Diagnosis  Description L97.522 Non-pressure chronic ulcer of other part of left foot with fat Modifier: layer exposed Quantity: 1 CPT4 Code: [id number] Description: [id number] - PARE BENIGN LES; SGL ICD-10 Diagnosis Description L84 Corns and callosities Modifier: Quantity: 1 Physician Procedures CPT4 Code: [id number] Description: [id number] - WC PHYS SUBQ TISS 20 SQ CM ICD-10 Diagnosis Description L97.522 Non-pressure chronic ulcer of other part of left foot with fat Modifier: layer exposed Quantity: 1 CPT4 Code: [id number] Description: [id number] - WC PHYS PARE BENIGN LES; SGL ICD-10 Diagnosis Description L84 Corns and callosities Modifier: Quantity: 1 Electronic Signature(s) Signed: 06/13/2019 3:01:50 PM By: [name] [name] Entered By: [name] on 06/13/2019 15:01:50

## 2019-06-13 NOTE — Progress Notes (Signed)
Melinda [name], Melinda [name] ([id number]) Visit Report for 06/13/2019 Arrival Information Details Patient Name: Melinda [name], Melinda [name] Date of Service: 06/13/2019 2:45 PM Medical Record Number: [id number] Patient Account Number: [id number] Date of Birth/Sex: [birth year] (64 y.o. F) Treating RN: [name] Primary Care Sparkles Mcneely: [name] Other Clinician: Referring Maloni Musleh: [name] Treating Marice Angelino/Extender: [name], [name]: 15 Visit Information History Since Last Visit Added or deleted any medications: Yes Patient Arrived: Ambulatory Any new allergies or adverse reactions: No Arrival Time: 14:22 Had a fall or experienced change in No Accompanied By: self activities of daily living that may affect Transfer Assistance: Manual risk of falls: Patient Identification Verified: Yes Signs or symptoms of abuse/neglect since last visito No Secondary Verification Process Yes Hospitalized since last visit: No Completed: Implantable device outside of the clinic excluding No Patient Has Alerts: Yes cellular tissue based products placed in the center Patient Alerts: Patient on Blood since last visit: Thinner Has Dressing in Place as Prescribed: Yes Plavix, Aspirin 81mg  Pain Present Now: No ABI's L .88/R .71 Electronic Signature(s) Signed: 06/13/2019 3:47:37 PM By: [name], [name], RN, [name], [name] RN, [name] Entered By: [name], [name], RN, [name], [name] on 06/13/2019 14:23:31 Melinda [name] ([id number]) -------------------------------------------------------------------------------- Encounter Discharge Information Details Patient Name: Melinda [name] Date of Service: 06/13/2019 2:45 PM Medical Record Number: [id number] Patient Account Number: [id number] Date of Birth/Sex: 06-Jul-1955 (64 y.o. F) Treating RN: [name] Primary Care Lekeisha Arenas: [name] Other Clinician: Referring Grayer Sproles: [name] Treating Jawann Urbani/Extender: [name], [name]: 15 Encounter Discharge  Information Items Post Procedure Vitals Discharge Condition: Stable Temperature (F): 99.3 Ambulatory Status: Ambulatory Pulse (bpm): 98 Discharge Destination: Home Respiratory Rate (breaths/min): 16 Transportation: Private Auto Blood Pressure (mmHg): 157/57 Accompanied By: self Schedule Follow-up Appointment: Yes Clinical Summary of Care: Electronic Signature(s) Signed: 06/13/2019 3:03:45 PM By: [name] Entered By: [name] on 06/13/2019 14:43:45 Melinda [name] ([id number]) -------------------------------------------------------------------------------- Lower Extremity Assessment Details Patient Name: Melinda [name] Date of Service: 06/13/2019 2:45 PM Medical Record Number: [id number] Patient Account Number: [id number] Date of Birth/Sex: Mar 17, 1956 (64 y.o. F) Treating RN: [name] Primary Care Dayzha Pogosyan: [name] Other Clinician: Referring Isabellarose Kope: [name] Treating Lazaro Isenhower/Extender: [name], [name]: 15 Vascular Assessment Pulses: Dorsalis Pedis Palpable: [Left:Yes] Electronic Signature(s) Signed: 06/13/2019 3:47:37 PM By: [name], [name], RN, [name], [name] RN, [name] Entered By: [name], [name], RN, [name], [name] on 06/13/2019 14:29:47 Melinda [name] ([id number]) -------------------------------------------------------------------------------- Multi Wound Chart Details Patient Name: Melinda [name] Date of Service: 06/13/2019 2:45 PM Medical Record Number: [id number] Patient Account Number: [id number] Date of Birth/Sex: 1956/02/26 (64 y.o. F) Treating RN: [name] Primary Care Krupa Stege: [name] Other Clinician: Referring Tykeshia Tourangeau: [name] Treating Versie Fleener/Extender: [name], [name]: 15 Vital Signs Height(in): 68 Pulse(bpm): 98 Weight(lbs): 300 Blood Pressure(mmHg): 157/57 Body Mass Index(BMI): 46 Temperature(F): 99.3 Respiratory Rate 16 (breaths/min): Photos: [N/A:N/A] Wound Location: Left Toe Great -  Plantar N/A N/A Wounding Event: Gradually Appeared N/A N/A Primary Etiology: Diabetic Wound/Ulcer of the N/A N/A Lower Extremity Comorbid History: Cataracts, Coronary Artery N/A N/A Disease, Hypertension, Myocardial Infarction, Peripheral Venous Disease, Type II Diabetes, Osteoarthritis, Osteomyelitis, Neuropathy Date Acquired: 11/30/2018 N/A N/A Weeks of Treatment: 15 N/A N/A Wound Status: Open N/A N/A Measurements L x W x D 0.7x0.6x0.2 N/A N/A (cm) Area (cm) : 0.33 N/A N/A Volume (cm) : 0.066 N/A N/A % Reduction in Area: 89.50% N/A N/A % Reduction in Volume: 95.80% N/A N/A Classification: Grade 2 N/A N/A Exudate  Amount: Medium N/A N/A Exudate Type: Serosanguineous N/A N/A Exudate Color: red, brown N/A N/A Wound Margin: Epibole N/A N/A Granulation Amount: Large (67-100%) N/A N/A Granulation Quality: Pale N/A N/A Necrotic Amount: Small (1-33%) N/A N/A Exposed Structures: N/A N/A Melinda [name], Melinda [name] ([id number]) Fat Layer (Subcutaneous Tissue) Exposed: Yes Fascia: No Tendon: No Muscle: No Joint: No Bone: No Epithelialization: None N/A N/A Treatment Notes Electronic Signature(s) Signed: 06/13/2019 3:03:45 PM By: [name] Entered By: [name] on 06/13/2019 14:34:11 Melinda [name] ([id number]) -------------------------------------------------------------------------------- Multi-Disciplinary Care Plan Details Patient Name: Melinda [name] Date of Service: 06/13/2019 2:45 PM Medical Record Number: [id number] Patient Account Number: [id number] Date of Birth/Sex: 07-Dec-1955 (64 y.o. F) Treating RN: [name] Primary Care Kierstynn Babich: [name] Other Clinician: Referring Allin Frix: [name] Treating Elene Downum/Extender: [name], [name]: 15 Active Inactive Medication Nursing Diagnoses: Knowledge deficit related to medication safety: actual or potential Goals: Patient/caregiver will demonstrate understanding of all current  medications Date Initiated: 02/26/2019 Target Resolution Date: 02/26/2019 Goal Status: Active Interventions: Assess for medication contraindications each visit where new medications are prescribed Notes: Necrotic Tissue Nursing Diagnoses: Impaired tissue integrity related to necrotic/devitalized tissue Goals: Necrotic/devitalized tissue will be minimized in the wound bed Date Initiated: 02/26/2019 Target Resolution Date: 03/06/2019 Goal Status: Active Interventions: Assess patient pain level pre-, during and post procedure and prior to discharge Treatment Activities: Apply topical anesthetic as ordered : 02/26/2019 Notes: Pressure Nursing Diagnoses: Knowledge deficit related to causes and risk factors for pressure ulcer development Knowledge deficit related to management of pressures ulcers Goals: Patient will remain free from development of additional pressure ulcers Date Initiated: 02/26/2019 Target Resolution Date: 02/26/2019 Melinda [name], Melinda [name] ([id number]) Goal Status: Active Patient will remain free of pressure ulcers Date Initiated: 02/26/2019 Target Resolution Date: 02/26/2019 Goal Status: Active Patient/caregiver will verbalize understanding of pressure ulcer management Date Initiated: 02/26/2019 Target Resolution Date: 02/26/2019 Goal Status: Active Interventions: Assess: immobility, friction, shearing, incontinence upon admission and as needed Notes: Wound/Skin Impairment Nursing Diagnoses: Impaired tissue integrity Goals: Ulcer/skin breakdown will have a volume reduction of 30% by week 4 Date Initiated: 02/26/2019 Target Resolution Date: 03/29/2019 Goal Status: Active Interventions: Provide education on ulcer and skin care Treatment Activities: Topical wound management initiated : 02/26/2019 Notes: Electronic Signature(s) Signed: 06/13/2019 3:03:45 PM By: [name] Entered By: [name] on 06/13/2019 14:33:19 Melinda [name]  ([id number]) -------------------------------------------------------------------------------- Non-Wound Condition Assessment Details Patient Name: Melinda [name] Date of Service: 06/13/2019 2:45 PM Medical Record Number: [id number] Patient Account Number: [id number] Date of Birth/Sex: [birth year] (64 y.o. F) Treating RN: [name] Primary Care Kymia Simi: [name] Other Clinician: Referring Julia Alkhatib: [name] Treating Ceejay Kegley/Extender: [name], [name]: 15 Non-Wound Condition: Condition: Other Dermatologic Condition Location: Foot Side: Right Notes patient with callus on right foot Electronic Signature(s) Signed: 06/13/2019 3:03:45 PM By: [name] Entered By: [name] on 06/13/2019 14:38:48 Melinda [name] ([id number]) -------------------------------------------------------------------------------- Pain Assessment Details Patient Name: Melinda [name] Date of Service: 06/13/2019 2:45 PM Medical Record Number: [id number] Patient Account Number: [id number] Date of Birth/Sex: 1956-03-07 (64 y.o. F) Treating RN: [name] Primary Care Knox Holdman: [name] Other Clinician: Referring Jia Dottavio: [name] Treating Jonda Alanis/Extender: [name], [name]: 15 Active Problems Location of Pain Severity and Description of Pain Patient Has Paino No Site Locations Pain Management and Medication Current Pain Management: Electronic Signature(s) Signed: 06/13/2019 3:47:37 PM By: [name], [name], RN, [name], [name] RN, [name] Entered By: [name], [name], RN, [name], [name] on 06/13/2019 14:23:36  Melinda [name], Melinda [name] ([id number]) -------------------------------------------------------------------------------- Patient/Caregiver Education Details Patient Name: Melinda [name], Melinda [name] Date of Service: 06/13/2019 2:45 PM Medical Record Number: [id number] Patient Account Number: [id number] Date of Birth/Gender: 03-Jul-1955 (64 y.o. F) Treating RN: [name] Primary Care  Physician: [name] Other Clinician: Referring Physician: [name] Treating Physician/Extender: Sharalyn Ink in Treatment: 15 Education Assessment Education Provided To: Patient Education Topics Provided Wound/Skin Impairment: Handouts: Other: wound care as ordered Methods: Demonstration, Explain/Verbal Responses: State content correctly Electronic Signature(s) Signed: 06/13/2019 3:03:45 PM By: [name] Entered By: [name] on 06/13/2019 14:42:35 Melinda [name] ([id number]) -------------------------------------------------------------------------------- Wound Assessment Details Patient Name: Melinda [name] Date of Service: 06/13/2019 2:45 PM Medical Record Number: [id number] Patient Account Number: [id number] Date of Birth/Sex: 1955-08-09 (64 y.o. F) Treating RN: [name] Primary Care Edie Darley: [name] Other Clinician: Referring Rahkeem Senft: [name] Treating Elzena Muston/Extender: [name], [name]: 15 Wound Status Wound Number: 1 Primary Diabetic Wound/Ulcer of the Lower Extremity Etiology: Wound Location: Left Toe Great - Plantar Wound Open Wounding Event: Gradually Appeared Status: Date Acquired: 11/30/2018 Comorbid Cataracts, Coronary Artery Disease, Weeks Of Treatment: 15 History: Hypertension, Myocardial Infarction, Peripheral Clustered Wound: No Venous Disease, Type II Diabetes, Osteoarthritis, Osteomyelitis, Neuropathy Photos Wound Measurements Length: (cm) 0.7 % Reduction i Width: (cm) 0.6 % Reduction i Depth: (cm) 0.2 Epithelializa Area: (cm) 0.33 Tunneling: Volume: (cm) 0.066 Undermining: n Area: 89.5% n Volume: 95.8% tion: None No No Wound Description Classification: Grade 2 Foul Odor Af Wound Margin: Epibole Slough/Fibri Exudate Amount: Medium Exudate Type: Serosanguineous Exudate Color: red, brown ter Cleansing: No no Yes Wound Bed Granulation Amount: Large (67-100%) Exposed  Structure Granulation Quality: Pale Fascia Exposed: No Necrotic Amount: Small (1-33%) Fat Layer (Subcutaneous Tissue) Exposed: Yes Necrotic Quality: Adherent Slough Tendon Exposed: No Muscle Exposed: No Joint Exposed: No Bone Exposed: No Melinda [name], Melinda [name] ([id number]) Treatment Notes Wound #1 (Left, Plantar Toe Great) Notes prisma, Foam, conform to foot Electronic Signature(s) Signed: 06/13/2019 3:47:37 PM By: [name], [name], RN, [name], [name] RN, [name] Entered By: [name], [name], RN, [name], [name] on 06/13/2019 14:28:45 Melinda [name] ([id number]) -------------------------------------------------------------------------------- Vitals Details Patient Name: Melinda [name] Date of Service: 06/13/2019 2:45 PM Medical Record Number: [id number] Patient Account Number: [id number] Date of Birth/Sex: 21-Nov-1955 (64 y.o. F) Treating RN: [name] Primary Care Armando Bukhari: [name] Other Clinician: Referring Halena Mohar: [name] Treating Makale Pindell/Extender: [name], [name]: 15 Vital Signs Time Taken: 14:23 Temperature (F): 99.3 Height (in): 68 Pulse (bpm): 98 Weight (lbs): 300 Respiratory Rate (breaths/min): 16 Body Mass Index (BMI): 45.6 Blood Pressure (mmHg): 157/57 Reference Range: 80 - 120 mg / dl Electronic Signature(s) Signed: 06/13/2019 3:47:37 PM By: [name], [name], RN, [name], [name] RN, [name] Entered By: [name], [name], RN, [name], [name] on 06/13/2019 14:24:00

## 2019-06-20 NOTE — Progress Notes (Signed)
CALDER, RAMLALL ([id number]) Visit Report for 05/20/2019 Arrival Information Details Patient Name: MONTSERRAT, GRZESIK. Date of Service: 05/20/2019 10:15 AM Medical Record Number: [id number] Patient Account Number: [id number] Date of Birth/Sex: 26-Sep-1955 (64 y.o. F) Treating RN: [name] Primary Care Sabah Zucco: [name] Other Clinician: Referring Orpha Dain: [name] Treating Rudolpho Claxton/Extender: [name], [name]: 11 Visit Information History Since Last Visit Added or deleted any medications: No Patient Arrived: Ambulatory Any new allergies or adverse reactions: No Arrival Time: 09:59 Had a fall or experienced change in No Accompanied By: self activities of daily living that may affect Transfer Assistance: None risk of falls: Patient Identification Verified: Yes Signs or symptoms of abuse/neglect since last visito No Secondary Verification Process Yes Hospitalized since last visit: No Completed: Implantable device outside of the clinic excluding No Patient Has Alerts: Yes cellular tissue based products placed in the center Patient Alerts: Patient on Blood since last visit: Thinner Has Dressing in Place as Prescribed: Yes Plavix, Aspirin 81mg  Pain Present Now: No ABI's L .88/R .71 Electronic Signature(s) Signed: 05/20/2019 3:52:50 PM By: Lorine Bears RCP, RRT, CHT Entered By: Lorine Bears on 05/20/2019 10:02:43 [name] ([id number]) -------------------------------------------------------------------------------- Encounter Discharge Information Details Patient Name: [name]. Date of Service: 05/20/2019 10:15 AM Medical Record Number: [id number] Patient Account Number: [id number] Date of Birth/Sex: 1955/06/05 (64 y.o. F) Treating RN: Army Melia Primary Care Areej Tayler: [name] Other Clinician: Referring Trooper Olander: [name] Treating Benjamin Merrihew/Extender: [name], [name]: 11 Encounter  Discharge Information Items Post Procedure Vitals Discharge Condition: Stable Temperature (F): 99.2 Ambulatory Status: Ambulatory Pulse (bpm): 89 Discharge Destination: Home Respiratory Rate (breaths/min): 16 Transportation: Private Auto Blood Pressure (mmHg): 126/47 Accompanied By: self Schedule Follow-up Appointment: Yes Clinical Summary of Care: Electronic Signature(s) Signed: 05/20/2019 3:23:32 PM By: Army Melia Entered By: Army Melia on 05/20/2019 10:15:27 [name] ([id number]) -------------------------------------------------------------------------------- Lower Extremity Assessment Details Patient Name: [name]. Date of Service: 05/20/2019 10:15 AM Medical Record Number: [id number] Patient Account Number: [id number] Date of Birth/Sex: 02-Sep-1955 (64 y.o. F) Treating RN: Army Melia Primary Care Luay Balding: [name] Other Clinician: Referring Brylie Sneath: [name] Treating Darlette Dubow/Extender: [name], [name]: 11 Edema Assessment Assessed: [Left: No] [Right: No] Edema: [Left: N] [Right: o] Vascular Assessment Pulses: Dorsalis Pedis Palpable: [Left:Yes] Electronic Signature(s) Signed: 05/20/2019 10:08:09 AM By: Army Melia Entered By: Army Melia on 05/20/2019 10:08:08 [name] ([id number]) -------------------------------------------------------------------------------- Multi Wound Chart Details Patient Name: [name]. Date of Service: 05/20/2019 10:15 AM Medical Record Number: [id number] Patient Account Number: [id number] Date of Birth/Sex: 1955/12/20 (64 y.o. F) Treating RN: Army Melia Primary Care Whittaker Lenis: [name] Other Clinician: Referring Juno Bozard: [name] Treating Jamori Biggar/Extender: [name], [name]: 11 Vital Signs Height(in): 68 Pulse(bpm): 89 Weight(lbs): 300 Blood Pressure(mmHg): 126/47 Body Mass Index(BMI): 46 Temperature(F): 99.2 Respiratory  Rate 16 (breaths/min): Photos: [N/A:N/A] Wound Location: Left Toe Great - Plantar N/A N/A Wounding Event: Gradually Appeared N/A N/A Primary Etiology: Diabetic Wound/Ulcer of the N/A N/A Lower Extremity Comorbid History: Cataracts, Coronary Artery N/A N/A Disease, Hypertension, Myocardial Infarction, Peripheral Venous Disease, Type II Diabetes, Osteoarthritis, Osteomyelitis, Neuropathy Date Acquired: 11/30/2018 N/A N/A Weeks of Treatment: 11 N/A N/A Wound Status: Open N/A N/A Measurements L x W x D 0.9x0.6x0.2 N/A N/A (cm) Area (cm) : 0.424 N/A N/A Volume (cm) : 0.085 N/A N/A % Reduction in Area: 86.50% N/A N/A % Reduction in Volume: 94.60% N/A N/A Classification: Grade 2 N/A N/A  Exudate Amount: Medium N/A N/A Exudate Type: Serosanguineous N/A N/A Exudate Color: red, brown N/A N/A Wound Margin: Epibole N/A N/A Granulation Amount: Medium (34-66%) N/A N/A Granulation Quality: Pale N/A N/A Necrotic Amount: Medium (34-66%) N/A N/A Exposed Structures: N/A N/A MIMMA, MEER ([id number]) Fat Layer (Subcutaneous Tissue) Exposed: Yes Fascia: No Tendon: No Muscle: No Joint: No Bone: No Epithelialization: None N/A N/A Treatment Notes Electronic Signature(s) Signed: 05/20/2019 3:23:32 PM By: Army Melia Entered By: Army Melia on 05/20/2019 10:12:45 [name] ([id number]) -------------------------------------------------------------------------------- Multi-Disciplinary Care Plan Details Patient Name: [name] Date of Service: 05/20/2019 10:15 AM Medical Record Number: [id number] Patient Account Number: [id number] Date of Birth/Sex: October 23, 1955 (64 y.o. F) Treating RN: Army Melia Primary Care Kainoa Swoboda: [name] Other Clinician: Referring Britian Jentz: [name] Treating Amiel Sharrow/Extender: [name], [name]: 11 Active Inactive Medication Nursing Diagnoses: Knowledge deficit related to medication safety: actual or  potential Goals: Patient/caregiver will demonstrate understanding of all current medications Date Initiated: 02/26/2019 Target Resolution Date: 02/26/2019 Goal Status: Active Interventions: Assess for medication contraindications each visit where new medications are prescribed Notes: Necrotic Tissue Nursing Diagnoses: Impaired tissue integrity related to necrotic/devitalized tissue Goals: Necrotic/devitalized tissue will be minimized in the wound bed Date Initiated: 02/26/2019 Target Resolution Date: 03/06/2019 Goal Status: Active Interventions: Assess patient pain level pre-, during and post procedure and prior to discharge Treatment Activities: Apply topical anesthetic as ordered : 02/26/2019 Notes: Orientation to the Wound Care Program Nursing Diagnoses: Knowledge deficit related to the wound healing center program Goals: Patient/caregiver will verbalize understanding of the [facility] Program Date Initiated: 02/26/2019 Target Resolution Date: 02/26/2019 Goal Status: Active ORELL, DOBBIE ([id number]) Interventions: Provide education on orientation to the wound center Notes: Pressure Nursing Diagnoses: Knowledge deficit related to causes and risk factors for pressure ulcer development Knowledge deficit related to management of pressures ulcers Goals: Patient will remain free from development of additional pressure ulcers Date Initiated: 02/26/2019 Target Resolution Date: 02/26/2019 Goal Status: Active Patient will remain free of pressure ulcers Date Initiated: 02/26/2019 Target Resolution Date: 02/26/2019 Goal Status: Active Patient/caregiver will verbalize understanding of pressure ulcer management Date Initiated: 02/26/2019 Target Resolution Date: 02/26/2019 Goal Status: Active Interventions: Assess: immobility, friction, shearing, incontinence upon admission and as needed Notes: Wound/Skin Impairment Nursing Diagnoses: Impaired tissue  integrity Goals: Ulcer/skin breakdown will have a volume reduction of 30% by week 4 Date Initiated: 02/26/2019 Target Resolution Date: 03/29/2019 Goal Status: Active Interventions: Provide education on ulcer and skin care Treatment Activities: Topical wound management initiated : 02/26/2019 Notes: Electronic Signature(s) Signed: 05/20/2019 3:23:32 PM By: Army Melia Entered By: Army Melia on 05/20/2019 10:12:38 [name] ([id number]) -------------------------------------------------------------------------------- Pain Assessment Details Patient Name: [name]. Date of Service: 05/20/2019 10:15 AM Medical Record Number: [id number] Patient Account Number: [id number] Date of Birth/Sex: [birth year] (64 y.o. F) Treating RN: [name] Primary Care Aurelia Gras: [name] Other Clinician: Referring Fanny Agan: [name] Treating Tylie Golonka/Extender: [name], [name]: 11 Active Problems Location of Pain Severity and Description of Pain Patient Has Paino No Site Locations Pain Management and Medication Current Pain Management: Electronic Signature(s) Signed: 05/20/2019 3:52:50 PM By: Lorine Bears RCP, RRT, CHT Signed: 06/20/2019 12:02:10 PM By: [name] Entered By: Lorine Bears on 05/20/2019 10:03:21 [name] ([id number]) -------------------------------------------------------------------------------- Patient/Caregiver Education Details Patient Name: VERNICA, JUDAY. Date of Service: 05/20/2019 10:15 AM Medical Record Number: [id number] Patient Account Number: [id number] Date of Birth/Gender: 09-Nov-1955 (64 y.o. F) Treating RN: Army Melia Primary  Care Physician: [name] Other Clinician: Referring Physician: [name] Treating Physician/Extender: Sharalyn Ink in Treatment: 11 Education Assessment Education Provided To: Patient Education Topics Provided Wound/Skin Impairment: Handouts:  Caring for Your Ulcer Methods: Demonstration, Explain/Verbal Responses: State content correctly Electronic Signature(s) Signed: 05/20/2019 3:23:32 PM By: Army Melia Entered By: Army Melia on 05/20/2019 10:14:52 [name] ([id number]) -------------------------------------------------------------------------------- Wound Assessment Details Patient Name: [name]. Date of Service: 05/20/2019 10:15 AM Medical Record Number: [id number] Patient Account Number: [id number] Date of Birth/Sex: 1955/06/10 (64 y.o. F) Treating RN: Army Melia Primary Care Vivion Romano: [name] Other Clinician: Referring Arwilda Georgia: [name] Treating Shannia Jacuinde/Extender: [name], [name]: 11 Wound Status Wound Number: 1 Primary Diabetic Wound/Ulcer of the Lower Extremity Etiology: Wound Location: Left Toe Great - Plantar Wound Open Wounding Event: Gradually Appeared Status: Date Acquired: 11/30/2018 Comorbid Cataracts, Coronary Artery Disease, Weeks Of Treatment: 11 History: Hypertension, Myocardial Infarction, Peripheral Clustered Wound: No Venous Disease, Type II Diabetes, Osteoarthritis, Osteomyelitis, Neuropathy Photos Wound Measurements Length: (cm) 0.9 % Reduction i Width: (cm) 0.6 % Reduction i Depth: (cm) 0.2 Epithelializa Area: (cm) 0.424 Tunneling: Volume: (cm) 0.085 Undermining: n Area: 86.5% n Volume: 94.6% tion: None No No Wound Description Classification: Grade 2 Foul Odor Af Wound Margin: Epibole Slough/Fibri Exudate Amount: Medium Exudate Type: Serosanguineous Exudate Color: red, brown ter Cleansing: No no Yes Wound Bed Granulation Amount: Medium (34-66%) Exposed Structure Granulation Quality: Pale Fascia Exposed: No Necrotic Amount: Medium (34-66%) Fat Layer (Subcutaneous Tissue) Exposed: Yes Necrotic Quality: Adherent Slough Tendon Exposed: No Muscle Exposed: No Joint Exposed: No Bone Exposed: No JONINE, PEABODY  ([id number]) Electronic Signature(s) Signed: 05/20/2019 10:07:55 AM By: Army Melia Entered By: Army Melia on 05/20/2019 10:07:54 [name] ([id number]) -------------------------------------------------------------------------------- Vitals Details Patient Name: [name]. Date of Service: 05/20/2019 10:15 AM Medical Record Number: [id number] Patient Account Number: [id number] Date of Birth/Sex: 05/21/1955 (64 y.o. F) Treating RN: [name] Primary Care Daisie Haft: [name] Other Clinician: Referring Erlean Mealor: [name] Treating Montez Cuda/Extender: [name], [name]: 11 Vital Signs Time Taken: 10:00 Temperature (F): 99.2 Height (in): 68 Pulse (bpm): 89 Weight (lbs): 300 Respiratory Rate (breaths/min): 16 Body Mass Index (BMI): 45.6 Blood Pressure (mmHg): 126/47 Reference Range: 80 - 120 mg / dl Electronic Signature(s) Signed: 05/20/2019 3:52:50 PM By: Lorine Bears RCP, RRT, CHT Entered By: Lorine Bears on 05/20/2019 10:04:15

## 2019-06-24 ENCOUNTER — Other Ambulatory Visit: Payer: Self-pay

## 2019-06-24 ENCOUNTER — Encounter: Payer: Medicare HMO | Admitting: Physician Assistant

## 2019-06-24 DIAGNOSIS — E11621 Type 2 diabetes mellitus with foot ulcer: Secondary | ICD-10-CM | POA: Diagnosis not present

## 2019-06-24 NOTE — Progress Notes (Addendum)
LEVANA, AGREDA ([id number]) Visit Report for 06/24/2019 Chief Complaint Document Details Patient Name: Melinda [name], Melinda [name] Date of Service: 06/24/2019 11:00 AM Medical Record Number: [id number] Patient Account Number: [id number] Date of Birth/Sex: 1955-06-18 (64 y.o. F) Treating RN: [name] Primary Care Provider: [name] Other Clinician: Referring Provider: [name] Treating Provider/Extender: [name], Adis Sturgill Weeks in Treatment: 16 Information Obtained from: Patient Chief Complaint Left great toe ulcer and left LE skin tear Electronic Signature(s) Signed: 06/24/2019 10:48:00 AM By: [name] [name] Entered By: [name] on 06/24/2019 10:48:00 Melinda [name] ([id number]) -------------------------------------------------------------------------------- Debridement Details Patient Name: Melinda [name] Date of Service: 06/24/2019 11:00 AM Medical Record Number: [id number] Patient Account Number: [id number] Date of Birth/Sex: 10-30-1955 (64 y.o. F) Treating RN: [name] Primary Care Provider: [name] Other Clinician: Referring Provider: [name] Treating Provider/Extender: [name], Michaeljoseph Revolorio Weeks in Treatment: 16 Debridement Performed for Wound #1 Left,Plantar Toe Great Assessment: Performed By: Physician [name], Haydon Dorris E., [name] Debridement Type: Debridement Severity of Tissue Pre Debridement: Fat layer exposed Level of Consciousness (Pre- Awake and Alert procedure): Pre-procedure Verification/Time Out Yes - 11:19 Taken: Start Time: 11:19 Pain Control: Lidocaine 4% Topical Solution Total Area Debrided (L x W): 0.5 (cm) x 0.6 (cm) = 0.3 (cm) Tissue and other material debrided: Viable, Non-Viable, Callus, Slough, Subcutaneous, Slough Level: Skin/Subcutaneous Tissue Debridement Description: Excisional Instrument: Curette Bleeding: Minimum Hemostasis Achieved: Pressure End Time: 11:23 Procedural Pain: 0 Post Procedural Pain: 0 Response to  Treatment: Procedure was tolerated well Level of Consciousness (Post- Awake and Alert procedure): Post Debridement Measurements of Total Wound Length: (cm) 0.5 Width: (cm) 0.6 Depth: (cm) 0.1 Volume: (cm) 0.024 Character of Wound/Ulcer Post Debridement: Improved Severity of Tissue Post Debridement: Fat layer exposed Post Procedure Diagnosis Same as Pre-procedure Electronic Signature(s) Signed: 06/24/2019 11:48:14 AM By: [name] [name] Signed: 06/24/2019 1:15:24 PM By: [name] Entered By: [name] on 06/24/2019 11:21:49 Melinda [name] ([id number]) -------------------------------------------------------------------------------- HPI Details Patient Name: Melinda [name] Date of Service: 06/24/2019 11:00 AM Medical Record Number: [id number] Patient Account Number: [id number] Date of Birth/Sex: 14-Apr-1956 (64 y.o. F) Treating RN: [name] Primary Care Provider: [name] Other Clinician: Referring Provider: [name] Treating Provider/Extender: [name], Maddyson Keil Weeks in Treatment: 16 History of Present Illness HPI Description: ADMISSION 02/26/2019 Patient is a 64 year old type II diabetic on insulin with significant polyneuropathy. She has been followed by Dr. Sherren Mocha cline of podiatry for problems related to her feet dating back to the early part of 2019 as I can review in [facility] link. 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 Subsequently she was seen by Dr. Lucky Cowboy of vascular surgery and had stents x2 placed in her left SFA as well as left SFA angioplasties on 05/20/2017. She was noted to have a wound on her left foot in October 2019. In August 2020 on 8/24 she underwent a right anterior tibial artery angioplasty a right tibioperoneal trunk angioplasty and a right SFA angioplasty. The patient states that she developed a left great toe wound in August which is at the base of her previous partial amputation in  this area. She tells [name] that she has had a right great toe wound since December 2019 and she has been using Santyl to both of these areas that she received from a fellow parishioner at her church. By enlarge she has been using Neosporin to these areas and not offloading them specifically Arterial studies  on 9/22 showed an ABI on the right of 0.71 with triphasic waveforms on the left at 0.88 with triphasic and biphasic waveforms. TBI's on the right and 0.44 and on the left at 1.05. 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 11//20; x-rays I did last week were both negative for osteomyelitis. She has a fairly large wound at the base of her left first toe and a small punched out area on the right first toe. We use silver alginate last week 03/13/2019 upon evaluation today patient appears to be doing okay with regard to her wounds at this point. She does have some callus buildup noted upon evaluation at this point. Fortunately there is no evidence of active infection which is also good news. I am going to have to perform some debridement to clear away some of the necrotic tissue today. 03/25/2019 on evaluation today patient appears to be doing well with regard to her foot ulcers. She has been tolerating the dressing changes without complication. Fortunately there is no signs of active infection at this time. Her left foot ulcer actually seems to be doing excellent no debridement even necessary today I am good have to perform some debridement on the right great toe. 04/08/19 on evaluation today patient actually appears to be doing well with regard to her wounds. In fact on the right this appears to be completely healed on the left this is measuring smaller although there still like callous around the edges of the wound. Fortunately there's no  evidence of active infection at this time there is some hyper granulation. 04/15/2019 on evaluation today patient actually appears to be doing well with regard to her toe ulcer. This seems to be showing signs of excellent granulation there is minimal slough/biofilm on the surface of the wound. She does have a significant amount of callus around the edges of the wound but at the same time I feel like that this is something we can easily pared down without any complication. Fortunately there is no evidence of active infection at this point. No fevers, chills, nausea, vomiting, or diarrhea. 04/22/2019 on evaluation today patient appears to be doing somewhat better in regard to her wound. She has been tolerating the dressing changes without complication. There is some callus noted at this point this can require some sharp debridement which I discussed with the patient as well. We will go ahead and proceed with debridement today to try to clear away some of this necrotic callus as well as clean off the biofilm/slough from the surface of the wound. 12/29-Patient returns at 1 week with regards to her left plantar foot wound which seems to be doing well, the callus was debrided around the wound the last time and seems to be doing much better since. Apparently it standing smaller, patient is a little discomforted by having to come every week to the clinic but she agrees to do that 05/06/19 on evaluation today patient actually appears to be doing well overall with regard to her plantar foot ulcer. She does have some callous buildup today but nonetheless this does not appear to be showing any signs of active infection at this time which is great news. The base of the wound does seem to be much healthier than what it was last time I saw her. No fevers, chills, nausea, or vomiting noted at this time. 05/13/2019 on evaluation  today patient appears to be doing well with regard to her plantar foot ulcer. She has been  tolerating the dressing changes without complication. In fact I am not even sure there is anything that is going require sharp debridement at this point today which is also good news. Fortunately there is no signs of active infection at this time. No fevers, chills, nausea, vomiting, or diarrhea. 05/19/2018 upon evaluation today patient actually appears to be doing excellent in regard to her wound on the plantar foot. She has been tolerating the dressing changes without complication. Fortunately there is no signs of active infection at this time which is good news. No fevers, chills, nausea, vomiting, or diarrhea. Melinda [name], Melinda [name] ([id number]) 05/27/2019 upon evaluation today patient appears to be doing excellent in regard to her foot ulcer. She has been tolerating the dressing changes without complication. Fortunately there is no evidence of active infection at this time which is good news. Overall she seems to be showing signs of excellent epithelization which is also excellent news. 06/13/2019 upon evaluation today patient appears to be doing well with regard to her left plantar foot ulcer. She has been tolerating the dressing changes without complication. Fortunately there is no signs of active infection at this time. She does not seem to be having too much drainage at this point which is also excellent news. Overall very pleased with how things have progressed. She does have a lot of callus on the right great toe but this does not seem to be 80 whereas significant as what were dealing with on the left. In fact the toe actually appears to be still healed as far as I am aware. There is no signs of active infection at this time. She does want to see if I can pare away some of the callus which I think is definitely something I can do for her today. 06/24/2019 upon evaluation today patient appears to be doing excellent in regard to her plantar foot wound. She has been tolerating the dressing changes  without complication. Fortunately there is no signs of active infection which is great news. Overall I do feel like she is getting very close to healing I do believe the collagen has been beneficial for her based on what I am seeing currently. She is extremely pleased to hear this and see how things are progressing. Electronic Signature(s) Signed: 07/04/2019 5:11:56 PM By: [name], [name], RN, [name], [name] RN, [name] Signed: 07/07/2019 8:50:54 AM By: [name] [name] Previous Signature: 06/25/2019 7:37:49 AM Version By: [name] [name] Entered By: [name] [name], RN, [name], [name] on 07/04/2019 17:11:56 Melinda [name] ([id number]) -------------------------------------------------------------------------------- Physical Exam Details Patient Name: Melinda [name], Melinda [name] Date of Service: 06/24/2019 11:00 AM Medical Record Number: [id number] Patient Account Number: [id number] Date of Birth/Sex: 03-03-1956 (64 y.o. F) Treating RN: [name] Primary Care Provider: [name] Other Clinician: Referring Provider: [name] Treating Provider/Extender: [name], Kaiyu Mirabal Weeks in Treatment: 30 Constitutional Well-nourished and well-hydrated in no acute distress. Respiratory normal breathing without difficulty. Psychiatric this patient is able to make decisions and demonstrates good insight into disease process. Alert and Oriented x 3. pleasant and cooperative. Notes Patient's wound bed currently showed signs of good granulation at this time. There was some slough noted on the central portion of the wound which was carefully debrided away. The patient also did have evidence of callus buildup around the edges which I did clear away as well. Electronic Signature(s) Signed: 06/25/2019 7:38:08 AM By: [name],  Yaakov Saindon [name] Entered By: [name] on 06/25/2019 07:38:08 Melinda [name] ([id number]) -------------------------------------------------------------------------------- Physician Orders Details Patient  Name: Melinda [name] Date of Service: 06/24/2019 11:00 AM Medical Record Number: [id number] Patient Account Number: [id number] Date of Birth/Sex: March 18, 1956 (64 y.o. F) Treating RN: [name] Primary Care Provider: [name] Other Clinician: Referring Provider: [name] Treating Provider/Extender: [name], Klayton Monie Weeks in Treatment: 16 Verbal / Phone Orders: No Diagnosis Coding ICD-10 Coding Code Description E11.621 Type 2 diabetes mellitus with foot ulcer E11.51 Type 2 diabetes mellitus with diabetic peripheral angiopathy without gangrene 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sed S81.802A Unspecified open wound, left lower leg, initial encounter E11.42 Type 2 diabetes mellitus with diabetic polyneuropathy L84 Corns and callosities Wound Cleansing Wound #1 Left,Plantar Toe Great o Cleanse wound with mild soap and water o No tub bath. Anesthetic (add to Medication List) Wound #1 Left,Plantar Toe Great o Topical Lidocaine 4% cream applied to wound bed prior to debridement (In Clinic Only). Primary Wound Dressing Wound #1 Left,Plantar Toe Great o Silver Collagen Secondary Dressing Wound #1 Left,Plantar Toe Great o Gauze and Kerlix/Conform o Foam Dressing Change Frequency Wound #1 Left,Plantar Toe Great o Change dressing every other day. Follow-up Appointments o Return Appointment in 1 week. Edema Control Wound #1 Left,Plantar Toe Great o Elevate legs to the level of the heart and pump ankles as often as possible Off-Loading Wound #1 Left,Plantar Toe Great o Open toe surgical shoe with peg assist. Additional Orders / Instructions o Stop Smoking o Increase protein intake. Electronic Signature(s) Signed: 06/24/2019 11:48:14 AM By: Lorrine Kin, Jiayi EMarland Kitchen ([id number]) Signed: 06/24/2019 1:15:24 PM By: [name] Entered By: [name] on 06/24/2019 11:24:29 Melinda [name]  ([id number]) -------------------------------------------------------------------------------- Problem List Details Patient Name: REIGHLYN, KINYON. Date of Service: 06/24/2019 11:00 AM Medical Record Number: [id number] Patient Account Number: [id number] Date of Birth/Sex: 09/12/1955 (64 y.o. F) Treating RN: [name] Primary Care Provider: [name] Other Clinician: Referring Provider: [name] Treating Provider/Extender: [name], Anjeli Casad Weeks in Treatment: 16 Active Problems ICD-10 Evaluated Encounter Code Description Active Date Today Diagnosis E11.621 Type 2 diabetes mellitus with foot ulcer 02/26/2019 No Yes E11.51 Type 2 diabetes mellitus with diabetic peripheral angiopathy without 02/26/2019 No Yes gangrene 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sed 02/26/2019 No Yes S81.802A Unspecified open wound, left lower leg, initial encounter 04/15/2019 No Yes E11.42 Type 2 diabetes mellitus with diabetic polyneuropathy 02/26/2019 No Yes L84 Corns and callosities 06/13/2019 No Yes Inactive Problems Resolved Problems ICD-10 Code Description Active Date Resolved Date L97.512 Non-pressure chronic ulcer of other part of right foot with fat layer exposed 02/26/2019 02/26/2019 Electronic Signature(s) Signed: 06/24/2019 10:47:51 AM By: [name] [name] Entered By: [name] on 06/24/2019 10:47:51 Melinda [name] ([id number]) -------------------------------------------------------------------------------- Progress Note Details Patient Name: Melinda [name] Date of Service: 06/24/2019 11:00 AM Medical Record Number: [id number] Patient Account Number: [id number] Date of Birth/Sex: December 31, 1955 (64 y.o. F) Treating RN: [name] Primary Care Provider: [name] Other Clinician: Referring Provider: [name] Treating Provider/Extender: [name], Gavriel Holzhauer Weeks in Treatment: 16 Subjective Chief Complaint Information obtained  from Patient Left great toe ulcer and left LE skin tear History of Present Illness (HPI) ADMISSION 02/26/2019 Patient is a 64 year old type II diabetic on insulin with significant polyneuropathy. She has been followed by Dr. Sherren Mocha cline of podiatry for problems related to her feet dating  back to the early part of 2019 as I can review in [facility] link. 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 Subsequently she was seen by Dr. Lucky Cowboy of vascular surgery and had stents x2 placed in her left SFA as well as left SFA angioplasties on 05/20/2017. She was noted to have a wound on her left foot in October 2019. In August 2020 on 8/24 she underwent a right anterior tibial artery angioplasty a right tibioperoneal trunk angioplasty and a right SFA angioplasty. The patient states that she developed a left great toe wound in August which is at the base of her previous partial amputation in this area. She tells [name] that she has had a right great toe wound since December 2019 and she has been using Santyl to both of these areas that she received from a fellow parishioner at her church. By enlarge she has been using Neosporin to these areas and not offloading them specifically Arterial studies on 9/22 showed an ABI on the right of 0.71 with triphasic waveforms on the left at 0.88 with triphasic and biphasic waveforms. TBI's on the right and 0.44 and on the left at 1.05. 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 11//20; x-rays I did last week were both negative for osteomyelitis. She has a fairly large wound at the base of her left first toe and a small punched out area on the right first toe. We use silver alginate last week 03/13/2019 upon evaluation today patient appears to be doing okay with regard to  her wounds at this point. She does have some callus buildup noted upon evaluation at this point. Fortunately there is no evidence of active infection which is also good news. I am going to have to perform some debridement to clear away some of the necrotic tissue today. 03/25/2019 on evaluation today patient appears to be doing well with regard to her foot ulcers. She has been tolerating the dressing changes without complication. Fortunately there is no signs of active infection at this time. Her left foot ulcer actually seems to be doing excellent no debridement even necessary today I am good have to perform some debridement on the right great toe. 04/08/19 on evaluation today patient actually appears to be doing well with regard to her wounds. In fact on the right this appears to be completely healed on the left this is measuring smaller although there still like callous around the edges of the wound. Fortunately there's no evidence of active infection at this time there is some hyper granulation. 04/15/2019 on evaluation today patient actually appears to be doing well with regard to her toe ulcer. This seems to be showing signs of excellent granulation there is minimal slough/biofilm on the surface of the wound. She does have a significant amount of callus around the edges of the wound but at the same time I feel like that this is something we can easily pared down without any complication. Fortunately there is no evidence of active infection at this point. No fevers, chills, nausea, vomiting, or diarrhea. 04/22/2019 on evaluation today patient appears to be doing somewhat better in regard to her wound. She has been tolerating the dressing changes without complication. There is some callus noted at this point this can require some sharp debridement which I discussed with the patient as well.  We will go ahead and proceed with debridement today to try to clear away some of this necrotic callus as well  as clean off the biofilm/slough from the surface of the wound. 12/29-Patient returns at 1 week with regards to her left plantar foot wound which seems to be doing well, the callus was debrided around the wound the last time and seems to be doing much better since. Apparently it standing smaller, patient is a little discomforted by having to come every week to the clinic but she agrees to do that 05/06/19 on evaluation today patient actually appears to be doing well overall with regard to her plantar foot ulcer. She does have some callous buildup today but nonetheless this does not appear to be showing any signs of active infection at this time which is great news. The base of the wound does seem to be much healthier than what it was last time I saw her. No fevers, chills, nausea, or vomiting noted at this time. 05/13/2019 on evaluation today patient appears to be doing well with regard to her plantar foot ulcer. She has been tolerating the dressing changes without complication. In fact I am not even sure there is anything that is going require sharp debridement at this point today which is also good news. Melinda [name], Melinda [name] EMarland Kitchen ([id number]) Fortunately there is no signs of active infection at this time. No fevers, chills, nausea, vomiting, or diarrhea. 05/19/2018 upon evaluation today patient actually appears to be doing excellent in regard to her wound on the plantar foot. She has been tolerating the dressing changes without complication. Fortunately there is no signs of active infection at this time which is good news. No fevers, chills, nausea, vomiting, or diarrhea. 05/27/2019 upon evaluation today patient appears to be doing excellent in regard to her foot ulcer. She has been tolerating the dressing changes without complication. Fortunately there is no evidence of active infection at this time which is good news. Overall she seems to be showing signs of excellent epithelization which is also excellent  news. 06/13/2019 upon evaluation today patient appears to be doing well with regard to her left plantar foot ulcer. She has been tolerating the dressing changes without complication. Fortunately there is no signs of active infection at this time. She does not seem to be having too much drainage at this point which is also excellent news. Overall very pleased with how things have progressed. She does have a lot of callus on the right great toe but this does not seem to be 80 whereas significant as what were dealing with on the left. In fact the toe actually appears to be still healed as far as I am aware. There is no signs of active infection at this time. She does want to see if I can pare away some of the callus which I think is definitely something I can do for her today. 06/24/2019 upon evaluation today patient appears to be doing excellent in regard to her plantar foot wound. She has been tolerating the dressing changes without complication. Fortunately there is no signs of active infection which is great news. Overall I do feel like she is getting very close to healing I do believe the collagen has been beneficial for her based on what I am seeing currently. She is extremely pleased to hear this and see how things are progressing. Objective Constitutional Well-nourished and well-hydrated in no acute distress. Vitals Time Taken: 11:01 AM, Height: 68 in, Weight: 300 lbs, BMI: 45.6, Temperature:  98.9 F, Pulse: 92 bpm, Respiratory Rate: 16 breaths/min, Blood Pressure: 148/64 mmHg. Respiratory normal breathing without difficulty. Psychiatric this patient is able to make decisions and demonstrates good insight into disease process. Alert and Oriented x 3. pleasant and cooperative. General Notes: Patient's wound bed currently showed signs of good granulation at this time. There was some slough noted on the central portion of the wound which was carefully debrided away. The patient also did have  evidence of callus buildup around the edges which I did clear away as well. Integumentary (Hair, Skin) Wound #1 status is Open. Original cause of wound was Gradually Appeared. The wound is located on the [employer]. The wound measures 0.5cm length x 0.6cm width x 0.2cm depth; 0.236cm^2 area and 0.047cm^3 volume. There is Fat Layer (Subcutaneous Tissue) Exposed exposed. There is a medium amount of serosanguineous drainage noted. The wound margin is epibole. There is large (67-100%) pale granulation within the wound bed. There is a small (1-33%) amount of necrotic tissue within the wound bed including Adherent Slough. Assessment Active Problems ICD-10 Type 2 diabetes mellitus with foot ulcer Type 2 diabetes mellitus with diabetic peripheral angiopathy without gangrene Non-pressure chronic ulcer of other part of left foot with fat layer exposed Unspecified open wound, left lower leg, initial encounter Type 2 diabetes mellitus with diabetic polyneuropathy Corns and callosities Melinda [name], Melinda E. ([id number]) Procedures Wound #1 Pre-procedure diagnosis of Wound #1 is a Diabetic Wound/Ulcer of the Lower Extremity located on the Left,Plantar Toe Great .Severity of Tissue Pre Debridement is: Fat layer exposed. There was a Excisional Skin/Subcutaneous Tissue Debridement with a total area of 0.3 sq cm performed by [name], Kindred Heying E., [name]. With the following instrument(s): Curette to remove Viable and Non-Viable tissue/material. Material removed includes Callus, Subcutaneous Tissue, and Slough after achieving pain control using Lidocaine 4% Topical Solution. No specimens were taken. A time out was conducted at 11:19, prior to the start of the procedure. A Minimum amount of bleeding was controlled with Pressure. The procedure was tolerated well with a pain level of 0 throughout and a pain level of 0 following the procedure. Post Debridement Measurements: 0.5cm length x 0.6cm width x  0.1cm depth; 0.024cm^3 volume. Character of Wound/Ulcer Post Debridement is improved. Severity of Tissue Post Debridement is: Fat layer exposed. Post procedure Diagnosis Wound #1: Same as Pre-Procedure Plan Wound Cleansing: Wound #1 Left,Plantar Toe Great: Cleanse wound with mild soap and water No tub bath. Anesthetic (add to Medication List): Wound #1 Left,Plantar Toe Great: Topical Lidocaine 4% cream applied to wound bed prior to debridement (In Clinic Only). Primary Wound Dressing: Wound #1 Left,Plantar Toe Great: Silver Collagen Secondary Dressing: Wound #1 Left,Plantar Toe Great: Gauze and Kerlix/Conform Foam Dressing Change Frequency: Wound #1 Left,Plantar Toe Great: Change dressing every other day. Follow-up Appointments: Return Appointment in 1 week. Edema Control: Wound #1 Left,Plantar Toe Great: Elevate legs to the level of the heart and pump ankles as often as possible Off-Loading: Wound #1 Left,Plantar Toe Great: Open toe surgical shoe with peg assist. Additional Orders / Instructions: Stop Smoking Increase protein intake. 1. My suggestion currently is can be that we go ahead and continue with the current wound care measures utilizing silver collagen. I do think that is been beneficial for her and hopefully will continue to be such. 2. I am also going to suggest that we continue with the PEG assist offloading shoe that does seem to be keeping things under control as far as pressure getting to the  area is concerned. 3. I am going to suggest once we get her healed that she will go back into her diabetic shoes. With that being said were not quite there yet but we are getting very close. We will see patient back for reevaluation in 1 week here in the clinic. If anything worsens or changes patient will contact our office for additional recommendations. I do think weekly follow-up visits is best at this point to keep the callus from over taking the edges of the wound and  slowing down her healing progress. Melinda [name], Melinda [name] ([id number]) Electronic Signature(s) Signed: 07/08/2019 8:18:00 AM By: [name], [name], RN, [name], [name] RN, [name] Signed: 07/08/2019 4:47:36 PM By: [name] [name] Previous Signature: 06/25/2019 7:39:09 AM Version By: [name] [name] Entered By: [name] [name], RN, [name], [name] on 07/08/2019 08:18:00 Melinda [name] ([id number]) -------------------------------------------------------------------------------- SuperBill Details Patient Name: Melinda [name], Melinda [name] Date of Service: 06/24/2019 Medical Record Number: [id number] Patient Account Number: [id number] Date of Birth/Sex: [birth year] (64 y.o. F) Treating RN: [name] Primary Care Provider: [name] Other Clinician: Referring Provider: [name] Treating Provider/Extender: [name], Kennie Snedden Weeks in Treatment: 16 Diagnosis Coding ICD-10 Codes Code Description E11.621 Type 2 diabetes mellitus with foot ulcer E11.51 Type 2 diabetes mellitus with diabetic peripheral angiopathy without gangrene 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sed S81.802A Unspecified open wound, left lower leg, initial encounter E11.42 Type 2 diabetes mellitus with diabetic polyneuropathy L84 Corns and callosities Facility Procedures CPT4 Code: [id number] Description: [id number] - DEB SUBQ TISSUE 20 SQ CM/< Modifier: Quantity: 1 CPT4 Code: Description: ICD-10 Diagnosis Description 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sed Modifier: Quantity: Physician Procedures CPT4 Code: [id number] Description: [id number] - WC PHYS SUBQ TISS 20 SQ CM Modifier: Quantity: 1 CPT4 Code: Description: ICD-10 Diagnosis Description 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sed Modifier: Quantity: Electronic Signature(s) Signed: 06/25/2019 7:39:29 AM By: [name] [name] Entered By: [name] on 06/25/2019  07:39:29

## 2019-06-26 ENCOUNTER — Ambulatory Visit (INDEPENDENT_AMBULATORY_CARE_PROVIDER_SITE_OTHER): Payer: Medicare HMO | Admitting: Gastroenterology

## 2019-06-26 ENCOUNTER — Encounter: Payer: Self-pay | Admitting: Gastroenterology

## 2019-06-26 ENCOUNTER — Other Ambulatory Visit: Payer: Self-pay

## 2019-06-26 ENCOUNTER — Other Ambulatory Visit: Payer: Self-pay | Admitting: *Deleted

## 2019-06-26 DIAGNOSIS — R194 Change in bowel habit: Secondary | ICD-10-CM | POA: Diagnosis not present

## 2019-06-26 DIAGNOSIS — K5900 Constipation, unspecified: Secondary | ICD-10-CM

## 2019-06-26 DIAGNOSIS — I2510 Atherosclerotic heart disease of native coronary artery without angina pectoris: Secondary | ICD-10-CM

## 2019-06-26 MED ORDER — CLOPIDOGREL BISULFATE 75 MG PO TABS: 75.0000 mg | ORAL_TABLET | Freq: Every day | ORAL | 3 refills | Status: DC

## 2019-06-26 NOTE — Progress Notes (Signed)
Melinda [name], [place] [address]  Main: [phone]  Fax: [phone]   Gastroenterology Consultation  Referring Provider:     Donnie Coffin, MD Primary Care Physician:  Donnie Coffin, MD Reason for Consultation:    Constipation        HPI:   Virtual Visit via Telephone Note  I connected with patient on 06/26/19 at  1:45 PM EST by telephone and verified that I am speaking with the correct person using two identifiers.   I discussed the limitations, risks, security and privacy concerns of performing an evaluation and management service by telephone and the availability of in person appointments. I also discussed with the patient that there may be a patient responsible charge related to this service. The patient expressed understanding and agreed to proceed.  Location of the patient: Home Location of provider: Home Participating persons: Patient and provider only   History of Present Illness: Chief Complaint  Patient presents with  . New Patient (Initial Visit)  . Constipation    Patient states she having a bowel movement every other day. Patient state she strains so much. She states she has to take laxtive some time. She notice some blood on toliet paper      Melinda [name] is a 64 y.o. y/o female referred for consultation & management  by Dr. Clide Deutscher, Edmonia Lynch, MD.  Patient reports chronic history of constipation for years.  However, this has worsened lately and she sometimes goes a week and a half before she has a bowel movement.  States she tries not to take laxative, but when she goes this long without a bowel movement she will take some over-the-counter laxative that she does not know the name of.  No blood in stool.  No nausea or vomiting.  No weight loss.  Reports history of a colonoscopy over 10 years ago that she states was normal.  We do not have the procedure report.  Past Medical History:  Diagnosis Date  . Anxiety     . Coronary artery disease    a. 06/2014 NSTEMI s/p CABG x 4 (LIMA to LAD, VG to Diag, VG to OM, VG to PDA).  . Diabetic neuropathy ([place])   . GERD (gastroesophageal reflux disease)   . HTN (hypertension)   . Hyperlipidemia LDL goal <70   . IDDM (insulin dependent diabetes mellitus)   . Ischemic cardiomyopathy    a. 06/2014 Echo: EF 40-45%, HK of entire inferolateral and inferior myocardium c/w infarct of RCA/LCx, GR2DD, mild MR  . OA (osteoarthritis) of knee   . Obesity   . Osteoarthritis   . Osteomyelitis ([place])   . Peripheral neuropathy   . Peripheral vascular disease ([place])    a. 09/2016 Periph Angio: CTO R popliteal, CTO L prox/mid SFA->Med Rx; b. 05/2017 PTA: LSFA (Viabahn covered stent x 2), DEB to L Post Tibial; c. 06/2017 ABI: R 0.44, L 1.05.  . Tobacco abuse     Past Surgical History:  Procedure Laterality Date  . ABDOMINAL AORTOGRAM W/LOWER EXTREMITY N/A 10/27/2016   Procedure: Abdominal Aortogram w/Lower Extremity;  Surgeon: Nelva Bush, MD;  Location: [facility];  Service: Cardiovascular;  Laterality: N/A;  . AMPUTATION TOE Left 10/21/2015   Procedure: AMPUTATION TOE;  Surgeon: Sharlotte Alamo, DPM;  Location: [facility];  Service: Podiatry;  Laterality: Left;  . AMPUTATION TOE Left 05/15/2017   Procedure: AMPUTATION LEFT GREAT TOE;  Surgeon: Sharlotte Alamo, DPM;  Location: [facility];  Service: Podiatry;  Laterality: Left;  . AORTIC VALVE REPLACEMENT (AVR)/CORONARY ARTERY BYPASS GRAFTING (CABG)    . BREAST BIOPSY    . CARDIAC CATHETERIZATION  06/2014   95% stenosis mLAD, occlusion ostial OM1, 70% stenosis LCx, 95% stenosis mRCA, EF 45%.  . CORONARY ARTERY BYPASS GRAFT N/A 06/08/2014   Procedure: CORONARY ARTERY BYPASS GRAFTING (CABG);  Surgeon: Grace Isaac, MD;  Location: [facility];  Service: Open Heart Surgery;  Laterality: N/A;  Times 4 using left internal mammary artery to LAD artery and endoscopically harvested bilateral saphenous vein to Obtuse Marginal, Diagonal and  Posterior Descending coronary arteries.  . CT ABD W & PELVIS WO CM  06/2014   nl liver, gallbladder, spleen, mild diverticular changes, no bowel wall inflammation, appendix nl, no hernia, no other sig abnormalities  . LOWER EXTREMITY ANGIOGRAPHY Left 05/23/2017   Procedure: LOWER EXTREMITY ANGIOGRAPHY;  Surgeon: Algernon Huxley, MD;  Location: [facility];  Service: Cardiovascular;  Laterality: Left;  . LOWER EXTREMITY ANGIOGRAPHY Left 05/28/2017   Procedure: LOWER EXTREMITY ANGIOGRAPHY;  Surgeon: Algernon Huxley, MD;  Location: [facility];  Service: Cardiovascular;  Laterality: Left;  . LOWER EXTREMITY ANGIOGRAPHY Left 12/16/2018   Procedure: LOWER EXTREMITY ANGIOGRAPHY;  Surgeon: Algernon Huxley, MD;  Location: [facility];  Service: Cardiovascular;  Laterality: Left;  . LOWER EXTREMITY ANGIOGRAPHY Right 12/23/2018   Procedure: LOWER EXTREMITY ANGIOGRAPHY;  Surgeon: Algernon Huxley, MD;  Location: [facility];  Service: Cardiovascular;  Laterality: Right;  . TEE WITHOUT CARDIOVERSION N/A 06/08/2014   Procedure: TRANSESOPHAGEAL ECHOCARDIOGRAM (TEE);  Surgeon: Grace Isaac, MD;  Location: [facility];  Service: Open Heart Surgery;  Laterality: N/A;  . TOE AMPUTATION Right 12/2011   rt middle toe  . US ECHOCARDIOGRAPHY  06/2014   EF 50-55%, HK of inf/post/inferolat walls, Ao sclerosis    Prior to Admission medications   Medication Sig Start Date End Date Taking? Authorizing Provider  amLODipine (NORVASC) 5 MG tablet  04/21/19  Yes [provider]  aspirin 81 MG EC tablet Take 1 tablet (81 mg total) by mouth daily. 05/17/17  Yes Wieting, Richard, MD  atorvastatin (LIPITOR) 80 MG tablet TAKE 1 TABLET EVERY DAY  AT  6  PM 06/02/19  Yes Minna Merritts, MD  DULoxetine (CYMBALTA) 60 MG capsule  02/25/19  Yes [provider]  furosemide (LASIX) 20 MG tablet TAKE 1 TABLET (20 MG TOTAL) BY MOUTH DAILY AS NEEDED. 12/06/18  Yes Theora Gianotti, NP  insulin  aspart (NOVOLOG) 100 UNIT/ML injection Inject 3 Units into the skin 3 (three) times daily with meals. Patient taking differently: Inject 6 Units into the skin 3 (three) times daily with meals.  03/21/18 06/26/19 Yes McNew, Tyson Babinski, MD  insulin glargine (LANTUS) 100 UNIT/ML injection Inject 0.6 mLs (60 Units total) into the skin 2 (two) times daily. Patient taking differently: Inject 70 Units into the skin 2 (two) times daily.  03/21/18  Yes McNew, Tyson Babinski, MD  lidocaine (LIDODERM) 5 %  02/24/19  Yes [provider]  lisinopril (PRINIVIL,ZESTRIL) 5 MG tablet Take 1 tablet (5 mg total) by mouth daily. 03/21/18  Yes McNew, Tyson Babinski, MD  metoprolol tartrate (LOPRESSOR) 25 MG tablet Take 0.5 tablets (12.5 mg total) by mouth 2 (two) times daily. (BETA BLOCKER) 04/16/18  Yes Gollan, Kathlene November, MD  nicotine (NICODERM CQ - DOSED IN MG/24 HOURS) 21 mg/24hr patch Place 1 patch (21 mg  total) onto the skin daily. 04/30/19  Yes Kris Hartmann, NP  oxybutynin (DITROPAN) 5 MG tablet Take 5 mg by mouth 2 (two) times daily.  05/10/17  Yes [provider]  potassium chloride SA (KLOR-CON) 20 MEQ tablet TAKE 1 TABLET (20 MEQ TOTAL) BY MOUTH DAILY AS NEEDED. Please schedule office visit for further refills. Thank you! 05/22/19  Yes Theora Gianotti, NP  tiZANidine (ZANAFLEX) 4 MG tablet Take 4 mg by mouth 3 (three) times daily. 06/14/18  Yes [provider]  VICTOZA 18 MG/3ML SOPN  01/15/19  Yes [provider]  clopidogrel (PLAVIX) 75 MG tablet Take 1 tablet (75 mg total) by mouth daily. 06/26/19   Minna Merritts, MD  pregabalin (LYRICA) 100 MG capsule  06/05/19   [provider]    Family History  Problem Relation Age of Onset  . CAD Mother   . Hypertension Mother   . Diabetes Mother   . CAD Father   . Diabetes Father   . Sickle cell trait Other   . Asthma Other      Social History   Tobacco Use  . Smoking status: Current Some Day Smoker    Packs/day: 0.50     Years: 40.00    Pack years: 20.00    Types: Cigarettes  . Smokeless tobacco: Never Used  . Tobacco comment: 1 every 3-4 days  Substance Use Topics  . Alcohol use: No    Alcohol/week: 0.0 standard drinks  . Drug use: Not Currently    Comment: crack    Allergies as of 06/26/2019  . (No Known Allergies)    Review of Systems:    All systems reviewed and negative except where noted in HPI.   Observations/Objective:  Labs: CBC    Component Value Date/Time   WBC 11.5 (H) 03/12/2018 1616   RBC 4.13 03/12/2018 1616   HGB 9.1 (L) 03/12/2018 1616   HGB 13.1 10/10/2016 1911   HCT 30.0 (L) 03/12/2018 1616   HCT 40.6 10/10/2016 1911   PLT 380 03/12/2018 1616   PLT 301 10/10/2016 1911   MCV 72.6 (L) 03/12/2018 1616   MCV 78 (L) 10/10/2016 1911   MCV 77 (L) 06/03/2014 0405   MCH 22.0 (L) 03/12/2018 1616   MCHC 30.3 03/12/2018 1616   RDW 18.3 (H) 03/12/2018 1616   RDW 15.9 (H) 10/10/2016 1911   RDW 15.4 (H) 06/03/2014 0405   LYMPHSABS 2.8 03/12/2018 1616   LYMPHSABS 3.1 10/10/2016 1911   LYMPHSABS 1.5 06/03/2014 0405   MONOABS 1.0 03/12/2018 1616   MONOABS 1.0 (H) 06/03/2014 0405   EOSABS 0.5 03/12/2018 1616   EOSABS 0.6 (H) 10/10/2016 1911   EOSABS 0.5 06/03/2014 0405   BASOSABS 0.1 03/12/2018 1616   BASOSABS 0.0 10/10/2016 1911   BASOSABS 0.0 06/03/2014 0405   CMP     Component Value Date/Time   NA 143 03/14/2018 0707   NA 139 10/10/2016 1911   NA 140 06/03/2014 0405   K 4.5 03/14/2018 0707   K 4.1 06/03/2014 0405   CL 111 03/14/2018 0707   CL 107 06/03/2014 0405   CO2 26 03/14/2018 0707   CO2 26 06/03/2014 0405   GLUCOSE 122 (H) 03/14/2018 0707   GLUCOSE 227 (H) 06/03/2014 0405   BUN 25 (H) 12/23/2018 0851   BUN 16 10/10/2016 1911   BUN 13 06/03/2014 0405   CREATININE 1.45 (H) 12/23/2018 0851   CREATININE 1.13 06/03/2014 0405   CALCIUM 8.4 (L) 03/14/2018 [id number]  CALCIUM 8.1 (L) 06/03/2014 0405   PROT 7.4 03/12/2018 1616   PROT 6.8 10/10/2016 1911    PROT 7.1 06/01/2014 1725   ALBUMIN 3.2 (L) 03/12/2018 1616   ALBUMIN 3.9 10/10/2016 1911   ALBUMIN 3.2 (L) 06/01/2014 1725   AST 13 (L) 03/12/2018 1616   AST 14 (L) 06/01/2014 1725   ALT 13 03/12/2018 1616   ALT 14 06/01/2014 1725   ALKPHOS 89 03/12/2018 1616   ALKPHOS 103 06/01/2014 1725   BILITOT 0.5 03/12/2018 1616   BILITOT <0.2 10/10/2016 1911   BILITOT 0.4 06/01/2014 1725   GFRNONAA 38 (L) 12/23/2018 0851   GFRNONAA 52 (L) 06/03/2014 0405   GFRNONAA 54 (L) 11/28/2013 1754   GFRAA 44 (L) 12/23/2018 0851   GFRAA >60 06/03/2014 0405   GFRAA >60 11/28/2013 1754    Imaging Studies: No results found.  Assessment and Plan:   Aileene Kemler is a 64 y.o. y/o female has been referred for constipation  Assessment and Plan: High-fiber diet MiraLAX daily with goal of 1-2 soft bowel movements daily.  If not at goal, patient instructed to increase dose to twice daily.  If loose stools with the medication, patient asked to decrease the medication to every other day, or half dose daily.  Patient verbalized understanding  Since her constipation has worsened recently, a colonoscopy is indicated for change in bowel habits.  Patient is agreeable to scheduling.  However, will allow constipation to get better over the next few weeks before her colonoscopy to allow for a better colonoscopy prep.  She does report having stool testing for colorectal cancer screening within the last year by her primary care provider.  However, she still would like a colonoscopy given that she has had changes in bowel movements and this is reasonable given that this is a good indication for the procedure.  I have discussed alternative options, risks & benefits,  which include, but are not limited to, bleeding, infection, perforation,respiratory complication & drug reaction.  The patient agrees with this plan & written consent will be obtained.    She does not have any abdominal pain, blood in stool or any alarm  symptoms to indicate urgent endoscopy at this time.  Follow Up Instructions: 6 to 8 weeks  I discussed the assessment and treatment plan with the patient. The patient was provided an opportunity to ask questions and all were answered. The patient agreed with the plan and demonstrated an understanding of the instructions.   The patient was advised to call back or seek an in-person evaluation if the symptoms worsen or if the condition fails to improve as anticipated.  I provided 12 minutes of non-face-to-face time during this encounter.   Virgel Manifold, MD  Speech recognition software was used to dictate the above note.

## 2019-06-26 NOTE — Telephone Encounter (Signed)
Please advise if ok to refill Clopidogrel 75 mg tablet qd. Last filled by Loletha Grayer, MD.

## 2019-06-26 NOTE — Patient Instructions (Signed)

## 2019-06-30 ENCOUNTER — Ambulatory Visit: Payer: Medicare HMO | Admitting: Cardiovascular Disease

## 2019-07-03 ENCOUNTER — Other Ambulatory Visit: Payer: Self-pay

## 2019-07-03 ENCOUNTER — Encounter: Payer: Medicare HMO | Attending: Physician Assistant | Admitting: Physician Assistant

## 2019-07-03 ENCOUNTER — Telehealth: Payer: Self-pay

## 2019-07-03 DIAGNOSIS — I2510 Atherosclerotic heart disease of native coronary artery without angina pectoris: Secondary | ICD-10-CM | POA: Insufficient documentation

## 2019-07-03 DIAGNOSIS — X58XXXA Exposure to other specified factors, initial encounter: Secondary | ICD-10-CM | POA: Insufficient documentation

## 2019-07-03 DIAGNOSIS — L97522 Non-pressure chronic ulcer of other part of left foot with fat layer exposed: Secondary | ICD-10-CM | POA: Diagnosis present

## 2019-07-03 DIAGNOSIS — L84 Corns and callosities: Secondary | ICD-10-CM | POA: Insufficient documentation

## 2019-07-03 DIAGNOSIS — E1151 Type 2 diabetes mellitus with diabetic peripheral angiopathy without gangrene: Secondary | ICD-10-CM | POA: Insufficient documentation

## 2019-07-03 DIAGNOSIS — E669 Obesity, unspecified: Secondary | ICD-10-CM | POA: Insufficient documentation

## 2019-07-03 DIAGNOSIS — Z6841 Body Mass Index (BMI) 40.0 and over, adult: Secondary | ICD-10-CM | POA: Insufficient documentation

## 2019-07-03 DIAGNOSIS — I252 Old myocardial infarction: Secondary | ICD-10-CM | POA: Diagnosis not present

## 2019-07-03 DIAGNOSIS — S81802A Unspecified open wound, left lower leg, initial encounter: Secondary | ICD-10-CM | POA: Diagnosis not present

## 2019-07-03 DIAGNOSIS — Z794 Long term (current) use of insulin: Secondary | ICD-10-CM | POA: Insufficient documentation

## 2019-07-03 DIAGNOSIS — E11621 Type 2 diabetes mellitus with foot ulcer: Secondary | ICD-10-CM | POA: Insufficient documentation

## 2019-07-03 DIAGNOSIS — I10 Essential (primary) hypertension: Secondary | ICD-10-CM | POA: Diagnosis not present

## 2019-07-03 DIAGNOSIS — Z951 Presence of aortocoronary bypass graft: Secondary | ICD-10-CM | POA: Insufficient documentation

## 2019-07-03 DIAGNOSIS — M1990 Unspecified osteoarthritis, unspecified site: Secondary | ICD-10-CM | POA: Insufficient documentation

## 2019-07-03 DIAGNOSIS — E1142 Type 2 diabetes mellitus with diabetic polyneuropathy: Secondary | ICD-10-CM | POA: Diagnosis not present

## 2019-07-03 NOTE — Progress Notes (Signed)
Melinda [name], Melinda [name] ([id number]) Visit Report for 07/03/2019 Arrival Information Details Patient Name: Melinda [name], Melinda [name] Date of Service: 07/03/2019 10:15 AM Medical Record Number: [id number] Patient Account Number: [id number] Date of Birth/Sex: 10/03/1955 (64 y.o. F) Treating RN: [name] Primary Care Dantonio Justen: [name] Other Clinician: Referring Raedyn Wenke: [name] Treating Zakyra Kukuk/Extender: [name], [name]: 18 Visit Information History Since Last Visit Added or deleted any medications: No Patient Arrived: Ambulatory Any new allergies or adverse reactions: No Arrival Time: 10:15 Had a fall or experienced change in No Accompanied By: self activities of daily living that may affect Transfer Assistance: None risk of falls: Patient Identification Verified: Yes Signs or symptoms of abuse/neglect since last visito No Secondary Verification Process Completed: Yes Hospitalized since last visit: No Patient Has Alerts: Yes Implantable device outside of the clinic excluding No Patient Alerts: Patient on Blood Thinner cellular tissue based products placed in the center Plavix, Aspirin 81mg  since last visit: ABI's L .88/R .71 Has Dressing in Place as Prescribed: Yes Pain Present Now: No Electronic Signature(s) Signed: 07/03/2019 4:34:11 PM By: [name] Entered By: [name] on 07/03/2019 10:16:42 Melinda [name] ([id number]) -------------------------------------------------------------------------------- Encounter Discharge Information Details Patient Name: Melinda [name] Date of Service: 07/03/2019 10:15 AM Medical Record Number: [id number] Patient Account Number: [id number] Date of Birth/Sex: 09/19/1955 (64 y.o. F) Treating RN: Army Melia Primary Care Adrijana Haros: [name] Other Clinician: Referring Norton Bivins: [name] Treating Rendon Howell/Extender: [name], [name]: 18 Encounter Discharge Information Items Post Procedure  Vitals Discharge Condition: Stable Temperature (F): 98.8 Ambulatory Status: Ambulatory Pulse (bpm): 92 Discharge Destination: Home Respiratory Rate (breaths/min): 16 Transportation: Private Auto Blood Pressure (mmHg): 128/59 Accompanied By: self Schedule Follow-up Appointment: Yes Clinical Summary of Care: Electronic Signature(s) Signed: 07/03/2019 3:26:37 PM By: Army Melia Entered By: Army Melia on 07/03/2019 10:31:48 Melinda [name] ([id number]) -------------------------------------------------------------------------------- Lower Extremity Assessment Details Patient Name: Melinda [name] Date of Service: 07/03/2019 10:15 AM Medical Record Number: [id number] Patient Account Number: [id number] Date of Birth/Sex: [birth year] (64 y.o. F) Treating RN: [name] Primary Care Junelle Hashemi: [name] Other Clinician: Referring Raffaella Edison: [name] Treating Elain Wixon/Extender: [name], [name]: 18 Edema Assessment Assessed: [Left: No] [Right: No] Edema: [Left: N] [Right: o] Vascular Assessment Pulses: Dorsalis Pedis Palpable: [Left:Yes] Electronic Signature(s) Signed: 07/03/2019 4:34:11 PM By: [name] Entered By: [name] on 07/03/2019 10:24:33 Melinda [name] ([id number]) -------------------------------------------------------------------------------- Multi Wound Chart Details Patient Name: Melinda [name] Date of Service: 07/03/2019 10:15 AM Medical Record Number: [id number] Patient Account Number: [id number] Date of Birth/Sex: 1955/06/06 (64 y.o. F) Treating RN: Army Melia Primary Care Satine Hausner: [name] Other Clinician: Referring Quinette Hentges: [name] Treating Breaunna Gottlieb/Extender: [name], [name]: 18 Vital Signs Height(in): 68 Pulse(bpm): 9 Weight(lbs): 300 Blood Pressure(mmHg): 128/59 Body Mass Index(BMI): 46 Temperature(F): 98.8 Respiratory Rate(breaths/min): 16 Photos: [N/A:N/A] Wound Location: Left  Toe Great - Plantar N/A N/A Wounding Event: Gradually Appeared N/A N/A Primary Etiology: Diabetic Wound/Ulcer of the Lower N/A N/A Extremity Comorbid History: Cataracts, Coronary Artery Disease, N/A N/A Hypertension, Myocardial Infarction, Peripheral Venous Disease, Type II Diabetes, Osteoarthritis, Osteomyelitis, Neuropathy Date Acquired: 11/30/2018 N/A N/A Weeks of Treatment: 18 N/A N/A Wound Status: Open N/A N/A Measurements L x W x D (cm) 0.6x0.5x0.2 N/A N/A Area (cm) : 0.236 N/A N/A Volume (cm) : 0.047 N/A N/A % Reduction in Area: 92.50% N/A N/A % Reduction in Volume: 97.00% N/A N/A Classification: Grade 2 N/A N/A Exudate Amount: Medium N/A N/A Exudate  Type: Serosanguineous N/A N/A Exudate Color: red, brown N/A N/A Wound Margin: Epibole N/A N/A Granulation Amount: Large (67-100%) N/A N/A Granulation Quality: Pale N/A N/A Necrotic Amount: Small (1-33%) N/A N/A Exposed Structures: Fat Layer (Subcutaneous Tissue) N/A N/A Exposed: Yes Fascia: No Tendon: No Muscle: No Joint: No Bone: No Epithelialization: None N/A N/A Treatment Notes Electronic Signature(s) Melinda [name], Melinda [name] ([id number]) Signed: 07/03/2019 3:26:37 PM By: Army Melia Entered By: Army Melia on 07/03/2019 10:28:00 Melinda [name] ([id number]) -------------------------------------------------------------------------------- [place] Plan Details Patient Name: Melinda [name] Date of Service: 07/03/2019 10:15 AM Medical Record Number: [id number] Patient Account Number: [id number] Date of Birth/Sex: [birth year] (64 y.o. F) Treating RN: Army Melia Primary Care Emori Mumme: [name] Other Clinician: Referring Gibson Telleria: [name] Treating Charise Leinbach/Extender: [name], [name]: 18 Active Inactive Medication Nursing Diagnoses: Knowledge deficit related to medication safety: actual or potential Goals: Patient/caregiver will demonstrate understanding of all current  medications Date Initiated: 02/26/2019 Target Resolution Date: 02/26/2019 Goal Status: Active Interventions: Assess for medication contraindications each visit where new medications are prescribed Notes: Necrotic Tissue Nursing Diagnoses: Impaired tissue integrity related to necrotic/devitalized tissue Goals: Necrotic/devitalized tissue will be minimized in the wound bed Date Initiated: 02/26/2019 Target Resolution Date: 03/06/2019 Goal Status: Active Interventions: Assess patient pain level pre-, during and post procedure and prior to discharge Treatment Activities: Apply topical anesthetic as ordered : 02/26/2019 Notes: Pressure Nursing Diagnoses: Knowledge deficit related to causes and risk factors for pressure ulcer development Knowledge deficit related to management of pressures ulcers Goals: Patient will remain free from development of additional pressure ulcers Date Initiated: 02/26/2019 Target Resolution Date: 02/26/2019 Goal Status: Active Patient will remain free of pressure ulcers Date Initiated: 02/26/2019 Target Resolution Date: 02/26/2019 Goal Status: Active Patient/caregiver will verbalize understanding of pressure ulcer management Date Initiated: 02/26/2019 Target Resolution Date: 02/26/2019 Goal Status: Active Melinda [name], Melinda [name] ([id number]) Interventions: Assess: immobility, friction, shearing, incontinence upon admission and as needed Notes: Wound/Skin Impairment Nursing Diagnoses: Impaired tissue integrity Goals: Ulcer/skin breakdown will have a volume reduction of 30% by week 4 Date Initiated: 02/26/2019 Target Resolution Date: 03/29/2019 Goal Status: Active Interventions: Provide education on ulcer and skin care Treatment Activities: Topical wound management initiated : 02/26/2019 Notes: Electronic Signature(s) Signed: 07/03/2019 3:26:37 PM By: Army Melia Entered By: Army Melia on 07/03/2019 10:27:48 Melinda [name]  ([id number]) -------------------------------------------------------------------------------- Pain Assessment Details Patient Name: Melinda [name] Date of Service: 07/03/2019 10:15 AM Medical Record Number: [id number] Patient Account Number: [id number] Date of Birth/Sex: 1955-10-03 (64 y.o. F) Treating RN: [name] Primary Care Havah Ammon: [name] Other Clinician: Referring Karysa Heft: [name] Treating Athena Baltz/Extender: [name], [name]: 18 Active Problems Location of Pain Severity and Description of Pain Patient Has Paino No Site Locations Pain Management and Medication Current Pain Management: Electronic Signature(s) Signed: 07/03/2019 4:34:11 PM By: [name] Entered By: [name] on 07/03/2019 10:17:31 Melinda [name] ([id number]) -------------------------------------------------------------------------------- Patient/Caregiver Education Details Patient Name: Melinda [name] Date of Service: 07/03/2019 10:15 AM Medical Record Number: [id number] Patient Account Number: [id number] Date of Birth/Gender: 13-May-1955 (64 y.o. F) Treating RN: Army Melia Primary Care Physician: [name] Other Clinician: Referring Physician: [name] Treating Physician/Extender: Sharalyn Ink in Treatment: 18 Education Assessment Education Provided To: Patient Education Topics Provided Wound/Skin Impairment: Handouts: Caring for Your Ulcer Methods: Demonstration, Explain/Verbal Responses: State content correctly Electronic Signature(s) Signed: 07/03/2019 3:26:37 PM By: Army Melia Entered By: Army Melia on 07/03/2019 10:31:10 Melinda [name] ([id number]) -------------------------------------------------------------------------------- Wound Assessment Details  Patient Name: Melinda [name], Melinda [name] Date of Service: 07/03/2019 10:15 AM Medical Record Number: [id number] Patient Account Number: [id number] Date of Birth/Sex: Jan 26, 1956 (64 y.o.  F) Treating RN: [name] Primary Care Saharah Sherrow: [name] Other Clinician: Referring Lillianne Eick: [name] Treating Alexyss Balzarini/Extender: [name], [name]: 18 Wound Status Wound Number: 1 Primary Diabetic Wound/Ulcer of the Lower Extremity Etiology: Wound Location: Left Toe Great - Plantar Wound Open Wounding Event: Gradually Appeared Status: Date Acquired: 11/30/2018 Comorbid Cataracts, Coronary Artery Disease, Hypertension, Weeks Of Treatment: 18 History: Myocardial Infarction, Peripheral Venous Disease, Type II Clustered Wound: No Diabetes, Osteoarthritis, Osteomyelitis, Neuropathy Photos Wound Measurements Length: (cm) 0.6 % Re Width: (cm) 0.5 % Re Depth: (cm) 0.2 Epit Area: (cm) 0.236 Tun Volume: (cm) 0.047 Und duction in Area: 92.5% duction in Volume: 97% helialization: None neling: No ermining: No Wound Description Classification: Grade 2 Foul Wound Margin: Epibole Slou Exudate Amount: Medium Exudate Type: Serosanguineous Exudate Color: red, brown Odor After Cleansing: No gh/Fibrino Yes Wound Bed Granulation Amount: Large (67-100%) Exposed Structure Granulation Quality: Pale Fascia Exposed: No Necrotic Amount: Small (1-33%) Fat Layer (Subcutaneous Tissue) Exposed: Yes Necrotic Quality: Adherent Slough Tendon Exposed: No Muscle Exposed: No Joint Exposed: No Bone Exposed: No Treatment Notes Wound #1 (Left, Plantar Toe Great) Notes prisma, Foam, conform to foot Electronic Signature(s) Melinda [name], Melinda [name] ([id number]) Signed: 07/03/2019 4:34:11 PM By: [name] Entered By: [name] on 07/03/2019 10:23:25 Melinda [name] ([id number]) -------------------------------------------------------------------------------- Vitals Details Patient Name: Melinda [name] Date of Service: 07/03/2019 10:15 AM Medical Record Number: [id number] Patient Account Number: [id number] Date of Birth/Sex: 1955/06/15 (64 y.o. F) Treating RN: [name] Primary Care Kalilah Barua: [name] Other Clinician: Referring Tyrees Chopin: [name] Treating Rheba Diamond/Extender: [name], [name]: 18 Vital Signs Time Taken: 10:16 Temperature (F): 98.8 Height (in): 68 Pulse (bpm): 92 Weight (lbs): 300 Respiratory Rate (breaths/min): 16 Body Mass Index (BMI): 45.6 Blood Pressure (mmHg): 128/59 Reference Range: 80 - 120 mg / dl Electronic Signature(s) Signed: 07/03/2019 4:34:11 PM By: [name] Entered By: [name] on 07/03/2019 10:17:06

## 2019-07-03 NOTE — Progress Notes (Addendum)
CANYON, YANDO ([id number]) Visit Report for 07/03/2019 Chief Complaint Document Details Patient Name: Melinda [name], Melinda [name] Date of Service: 07/03/2019 10:15 AM Medical Record Number: [id number] Patient Account Number: [id number] Date of Birth/Sex: 1956-01-02 (64 y.o. F) Treating RN: Army Melia Primary Care Provider: [name] Other Clinician: Referring Provider: [name] Treating Provider/Extender: [name], Pranshu Lyster Weeks in Treatment: 18 Information Obtained from: Patient Chief Complaint Left great toe ulcer and left LE skin tear Electronic Signature(s) Signed: 07/03/2019 11:01:55 AM By: [name] [name] Entered By: [name] on 07/03/2019 11:01:54 Melinda [name] ([id number]) -------------------------------------------------------------------------------- Debridement Details Patient Name: Melinda [name] Date of Service: 07/03/2019 10:15 AM Medical Record Number: [id number] Patient Account Number: [id number] Date of Birth/Sex: 1955/12/22 (64 y.o. F) Treating RN: Army Melia Primary Care Provider: [name] Other Clinician: Referring Provider: [name] Treating Provider/Extender: [name], Olive Zmuda Weeks in Treatment: 18 Debridement Performed for Wound #1 Left,Plantar Toe Great Assessment: Performed By: Physician [name], Amelia Burgard E., [name] Debridement Type: Debridement Severity of Tissue Pre Debridement: Fat layer exposed Level of Consciousness (Pre- Awake and Alert procedure): Pre-procedure Verification/Time Out Yes - 10:28 Taken: Start Time: 10:29 Pain Control: Lidocaine Total Area Debrided (L x W): 0.6 (cm) x 0.5 (cm) = 0.3 (cm) Tissue and other material debrided: Viable, Non-Viable, Callus, Subcutaneous Level: Skin/Subcutaneous Tissue Debridement Description: Excisional Instrument: Curette Bleeding: Minimum Hemostasis Achieved: Pressure End Time: 10:29 Response to Treatment: Procedure was tolerated well Level of Consciousness (Post- Awake and  Alert procedure): Post Debridement Measurements of Total Wound Length: (cm) 0.6 Width: (cm) 0.5 Depth: (cm) 0.1 Volume: (cm) 0.024 Character of Wound/Ulcer Post Debridement: Stable Severity of Tissue Post Debridement: Fat layer exposed Post Procedure Diagnosis Same as Pre-procedure Electronic Signature(s) Signed: 07/03/2019 3:26:37 PM By: Army Melia Signed: 07/03/2019 5:37:52 PM By: [name] [name] Entered By: Army Melia on 07/03/2019 10:30:14 Melinda [name] ([id number]) -------------------------------------------------------------------------------- HPI Details Patient Name: Melinda [name] Date of Service: 07/03/2019 10:15 AM Medical Record Number: [id number] Patient Account Number: [id number] Date of Birth/Sex: 1955/09/15 (64 y.o. F) Treating RN: Army Melia Primary Care Provider: [name] Other Clinician: Referring Provider: [name] Treating Provider/Extender: [name], Theora Vankirk Weeks in Treatment: 18 History of Present Illness HPI Description: ADMISSION 02/26/2019 Patient is a 64 year old type II diabetic on insulin with significant polyneuropathy. She has been followed by Dr. Sherren Mocha cline of podiatry for problems related to her feet dating back to the early part of 2019 as I can review in [place] link. 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 Subsequently she was seen by Dr. Lucky Cowboy of vascular surgery and had stents x2 placed in her left SFA as well as left SFA angioplasties on 05/20/2017. She was noted to have a wound on her left foot in October 2019. In August 2020 on 8/24 she underwent a right anterior tibial artery angioplasty a right tibioperoneal trunk angioplasty and a right SFA angioplasty. The patient states that she developed a left great toe wound in August which is at the base of her previous partial amputation in this area. She tells [name] that she has had a right great toe wound since December 2019 and she  has been using Santyl to both of these areas that she received from a fellow parishioner at her church. By enlarge she has been using Neosporin to these areas and not offloading them specifically Arterial studies on 9/22 showed an ABI on the right of 0.71 with triphasic  waveforms on the left at 0.88 with triphasic and biphasic waveforms. TBI's on the right and 0.44 and on the left at 1.05. 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 11//20; x-rays I did last week were both negative for osteomyelitis. She has a fairly large wound at the base of her left first toe and a small punched out area on the right first toe. We use silver alginate last week 03/13/2019 upon evaluation today patient appears to be doing okay with regard to her wounds at this point. She does have some callus buildup noted upon evaluation at this point. Fortunately there is no evidence of active infection which is also good news. I am going to have to perform some debridement to clear away some of the necrotic tissue today. 03/25/2019 on evaluation today patient appears to be doing well with regard to her foot ulcers. She has been tolerating the dressing changes without complication. Fortunately there is no signs of active infection at this time. Her left foot ulcer actually seems to be doing excellent no debridement even necessary today I am good have to perform some debridement on the right great toe. 04/08/19 on evaluation today patient actually appears to be doing well with regard to her wounds. In fact on the right this appears to be completely healed on the left this is measuring smaller although there still like callous around the edges of the wound. Fortunately there's no evidence of active infection at this time there is some hyper granulation. 04/15/2019 on  evaluation today patient actually appears to be doing well with regard to her toe ulcer. This seems to be showing signs of excellent granulation there is minimal slough/biofilm on the surface of the wound. She does have a significant amount of callus around the edges of the wound but at the same time I feel like that this is something we can easily pared down without any complication. Fortunately there is no evidence of active infection at this point. No fevers, chills, nausea, vomiting, or diarrhea. 04/22/2019 on evaluation today patient appears to be doing somewhat better in regard to her wound. She has been tolerating the dressing changes without complication. There is some callus noted at this point this can require some sharp debridement which I discussed with the patient as well. We will go ahead and proceed with debridement today to try to clear away some of this necrotic callus as well as clean off the biofilm/slough from the surface of the wound. 12/29-Patient returns at 1 week with regards to her left plantar foot wound which seems to be doing well, the callus was debrided around the wound the last time and seems to be doing much better since. Apparently it standing smaller, patient is a little discomforted by having to come every week to the clinic but she agrees to do that 05/06/19 on evaluation today patient actually appears to be doing well overall with regard to her plantar foot ulcer. She does have some callous buildup today but nonetheless this does not appear to be showing any signs of active infection at this time which is great news. The base of the wound does seem to be much healthier than what it was last time I saw her. No fevers, chills, nausea, or vomiting noted at this time. 05/13/2019 on evaluation today patient appears to be doing well with regard to her plantar  foot ulcer. She has been tolerating the dressing changes without complication. In fact I am not even sure there is  anything that is going require sharp debridement at this point today which is also good news. Fortunately there is no signs of active infection at this time. No fevers, chills, nausea, vomiting, or diarrhea. 05/19/2018 upon evaluation today patient actually appears to be doing excellent in regard to her wound on the plantar foot. She has been tolerating the dressing changes without complication. Fortunately there is no signs of active infection at this time which is good news. No fevers, chills, nausea, vomiting, or diarrhea. VANDETTA, AYLING ([id number]) 05/27/2019 upon evaluation today patient appears to be doing excellent in regard to her foot ulcer. She has been tolerating the dressing changes without complication. Fortunately there is no evidence of active infection at this time which is good news. Overall she seems to be showing signs of excellent epithelization which is also excellent news. 06/13/2019 upon evaluation today patient appears to be doing well with regard to her left plantar foot ulcer. She has been tolerating the dressing changes without complication. Fortunately there is no signs of active infection at this time. She does not seem to be having too much drainage at this point which is also excellent news. Overall very pleased with how things have progressed. She does have a lot of callus on the right great toe but this does not seem to be 80 whereas significant as what were dealing with on the left. In fact the toe actually appears to be still healed as far as I am aware. There is no signs of active infection at this time. She does want to see if I can pare away some of the callus which I think is definitely something I can do for her today. 06/25/2019 upon evaluation today patient appears to be doing excellent in regard to her plantar foot wound. She has been tolerating the dressing changes without complication. Fortunately there is no signs of active infection which is great news.  Overall I do feel like she is getting very close to healing I do believe the collagen has been beneficial for her based on what I am seeing currently. She is extremely pleased to hear this and see how things are progressing. 07/03/2019 upon evaluation today patient appears to be doing very well with regard to her wound. She continues to show signs of improvement and I am very pleased with the progress that she is made. There does not appear to be any signs of active infection at this time which is also great news. No fevers, chills, nausea, vomiting, or diarrhea. Electronic Signature(s) Signed: 07/03/2019 5:21:57 PM By: [name] [name] Entered By: [name] on 07/03/2019 17:21:57 Melinda [name] ([id number]) -------------------------------------------------------------------------------- Physical Exam Details Patient Name: Melinda [name] Date of Service: 07/03/2019 10:15 AM Medical Record Number: [id number] Patient Account Number: [id number] Date of Birth/Sex: Sep 12, 1955 (64 y.o. F) Treating RN: Army Melia Primary Care Provider: [name] Other Clinician: Referring Provider: [name] Treating Provider/Extender: [name], Weylin Plagge Weeks in Treatment: 18 Constitutional Obese and well-hydrated in no acute distress. Respiratory normal breathing without difficulty. Psychiatric this patient is able to make decisions and demonstrates good insight into disease process. Alert and Oriented x 3. pleasant and cooperative. Notes Had actually showed signs of good granulation and epithelization. There was some callus noted that required sharp debridement there was a little bit of slough/biofilm on the surface of the wound as well  that did require sharp debridement. Post debridement the wound bed appears to be doing significantly better which is excellent news. Electronic Signature(s) Signed: 07/03/2019 5:22:15 PM By: [name] [name] Entered By: [name] on 07/03/2019  17:22:15 Melinda [name] ([id number]) -------------------------------------------------------------------------------- Physician Orders Details Patient Name: Melinda [name] Date of Service: 07/03/2019 10:15 AM Medical Record Number: [id number] Patient Account Number: [id number] Date of Birth/Sex: 16-Jan-1956 (64 y.o. F) Treating RN: Army Melia Primary Care Provider: [name] Other Clinician: Referring Provider: [name] Treating Provider/Extender: [name], Maicie Vanderloop Weeks in Treatment: 18 Verbal / Phone Orders: No Diagnosis Coding ICD-10 Coding Code Description E11.621 Type 2 diabetes mellitus with foot ulcer E11.51 Type 2 diabetes mellitus with diabetic peripheral angiopathy without gangrene 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sed S81.802A Unspecified open wound, left lower leg, initial encounter E11.42 Type 2 diabetes mellitus with diabetic polyneuropathy L84 Corns and callosities Wound Cleansing Wound #1 Left,Plantar Toe Great o Cleanse wound with mild soap and water o No tub bath. Anesthetic (add to Medication List) Wound #1 Left,Plantar Toe Great o Topical Lidocaine 4% cream applied to wound bed prior to debridement (In Clinic Only). Primary Wound Dressing Wound #1 Left,Plantar Toe Great o Silver Collagen Secondary Dressing Wound #1 Left,Plantar Toe Great o Gauze and Kerlix/Conform o Foam Dressing Change Frequency Wound #1 Left,Plantar Toe Great o Change dressing every other day. Follow-up Appointments o Return Appointment in 1 week. Edema Control Wound #1 Left,Plantar Toe Great o Elevate legs to the level of the heart and pump ankles as often as possible Off-Loading Wound #1 Left,Plantar Toe Great o Open toe surgical shoe with peg assist. Additional Orders / Instructions o Stop Smoking o Increase protein intake. Electronic Signature(s) Signed: 07/03/2019 3:26:37 PM By: Glori Bickers ([id number]) Signed: 07/03/2019 5:37:52 PM By: [name] [name] Entered By: Army Melia on 07/03/2019 10:30:56 Melinda [name] ([id number]) -------------------------------------------------------------------------------- Problem List Details Patient Name: ZAHLIA, VALENTINI. Date of Service: 07/03/2019 10:15 AM Medical Record Number: [id number] Patient Account Number: [id number] Date of Birth/Sex: 1955-08-16 (64 y.o. F) Treating RN: Army Melia Primary Care Provider: [name] Other Clinician: Referring Provider: [name] Treating Provider/Extender: [name], Aerith Canal Weeks in Treatment: 18 Active Problems ICD-10 Evaluated Encounter Code Description Active Date Today Diagnosis E11.621 Type 2 diabetes mellitus with foot ulcer 02/26/2019 No Yes E11.51 Type 2 diabetes mellitus with diabetic peripheral angiopathy without 02/26/2019 No Yes gangrene 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sed 02/26/2019 No Yes S81.802A Unspecified open wound, left lower leg, initial encounter 04/15/2019 No Yes E11.42 Type 2 diabetes mellitus with diabetic polyneuropathy 02/26/2019 No Yes L84 Corns and callosities 06/13/2019 No Yes Inactive Problems Resolved Problems ICD-10 Code Description Active Date Resolved Date L97.512 Non-pressure chronic ulcer of other part of right foot with fat layer exposed 02/26/2019 02/26/2019 Electronic Signature(s) Signed: 07/03/2019 10:26:43 AM By: [name] [name] Entered By: [name] on 07/03/2019 10:26:43 Melinda [name] ([id number]) -------------------------------------------------------------------------------- Progress Note Details Patient Name: Melinda [name] Date of Service: 07/03/2019 10:15 AM Medical Record Number: [id number] Patient Account Number: [id number] Date of Birth/Sex: 1955-10-28 (64 y.o. F) Treating RN: Army Melia Primary Care Provider: [name] Other  Clinician: Referring Provider: [name] Treating Provider/Extender: [name], Chanan Detwiler Weeks in Treatment: 18 Subjective Chief Complaint Information obtained from Patient Left great toe ulcer and left LE skin tear History of Present Illness (HPI) ADMISSION 02/26/2019  Patient is a 64 year old type II diabetic on insulin with significant polyneuropathy. She has been followed by Dr. Sherren Mocha cline of podiatry for problems related to her feet dating back to the early part of 2019 as I can review in [place] link. 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 Subsequently she was seen by Dr. Lucky Cowboy of vascular surgery and had stents x2 placed in her left SFA as well as left SFA angioplasties on 05/20/2017. She was noted to have a wound on her left foot in October 2019. In August 2020 on 8/24 she underwent a right anterior tibial artery angioplasty a right tibioperoneal trunk angioplasty and a right SFA angioplasty. The patient states that she developed a left great toe wound in August which is at the base of her previous partial amputation in this area. She tells [name] that she has had a right great toe wound since December 2019 and she has been using Santyl to both of these areas that she received from a fellow parishioner at her church. By enlarge she has been using Neosporin to these areas and not offloading them specifically Arterial studies on 9/22 showed an ABI on the right of 0.71 with triphasic waveforms on the left at 0.88 with triphasic and biphasic waveforms. TBI's on the right and 0.44 and on the left at 1.05. 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 11//20; x-rays I did last week were both negative for osteomyelitis. She has a fairly large wound at the base of her left first toe and a  small punched out area on the right first toe. We use silver alginate last week 03/13/2019 upon evaluation today patient appears to be doing okay with regard to her wounds at this point. She does have some callus buildup noted upon evaluation at this point. Fortunately there is no evidence of active infection which is also good news. I am going to have to perform some debridement to clear away some of the necrotic tissue today. 03/25/2019 on evaluation today patient appears to be doing well with regard to her foot ulcers. She has been tolerating the dressing changes without complication. Fortunately there is no signs of active infection at this time. Her left foot ulcer actually seems to be doing excellent no debridement even necessary today I am good have to perform some debridement on the right great toe. 04/08/19 on evaluation today patient actually appears to be doing well with regard to her wounds. In fact on the right this appears to be completely healed on the left this is measuring smaller although there still like callous around the edges of the wound. Fortunately there's no evidence of active infection at this time there is some hyper granulation. 04/15/2019 on evaluation today patient actually appears to be doing well with regard to her toe ulcer. This seems to be showing signs of excellent granulation there is minimal slough/biofilm on the surface of the wound. She does have a significant amount of callus around the edges of the wound but at the same time I feel like that this is something we can easily pared down without any complication. Fortunately there is no evidence of active infection at this point. No fevers, chills, nausea, vomiting, or diarrhea. 04/22/2019 on evaluation today patient appears to be doing somewhat better in regard to her wound. She has  been tolerating the dressing changes without complication. There is some callus noted at this point this can require some sharp  debridement which I discussed with the patient as well. We will go ahead and proceed with debridement today to try to clear away some of this necrotic callus as well as clean off the biofilm/slough from the surface of the wound. 12/29-Patient returns at 1 week with regards to her left plantar foot wound which seems to be doing well, the callus was debrided around the wound the last time and seems to be doing much better since. Apparently it standing smaller, patient is a little discomforted by having to come every week to the clinic but she agrees to do that 05/06/19 on evaluation today patient actually appears to be doing well overall with regard to her plantar foot ulcer. She does have some callous buildup today but nonetheless this does not appear to be showing any signs of active infection at this time which is great news. The base of the wound does seem to be much healthier than what it was last time I saw her. No fevers, chills, nausea, or vomiting noted at this time. 05/13/2019 on evaluation today patient appears to be doing well with regard to her plantar foot ulcer. She has been tolerating the dressing changes without complication. In fact I am not even sure there is anything that is going require sharp debridement at this point today which is also good news. RHANDA, DALL EMarland Kitchen ([id number]) Fortunately there is no signs of active infection at this time. No fevers, chills, nausea, vomiting, or diarrhea. 05/19/2018 upon evaluation today patient actually appears to be doing excellent in regard to her wound on the plantar foot. She has been tolerating the dressing changes without complication. Fortunately there is no signs of active infection at this time which is good news. No fevers, chills, nausea, vomiting, or diarrhea. 05/27/2019 upon evaluation today patient appears to be doing excellent in regard to her foot ulcer. She has been tolerating the dressing changes without complication. Fortunately  there is no evidence of active infection at this time which is good news. Overall she seems to be showing signs of excellent epithelization which is also excellent news. 06/13/2019 upon evaluation today patient appears to be doing well with regard to her left plantar foot ulcer. She has been tolerating the dressing changes without complication. Fortunately there is no signs of active infection at this time. She does not seem to be having too much drainage at this point which is also excellent news. Overall very pleased with how things have progressed. She does have a lot of callus on the right great toe but this does not seem to be 80 whereas significant as what were dealing with on the left. In fact the toe actually appears to be still healed as far as I am aware. There is no signs of active infection at this time. She does want to see if I can pare away some of the callus which I think is definitely something I can do for her today. 06/25/2019 upon evaluation today patient appears to be doing excellent in regard to her plantar foot wound. She has been tolerating the dressing changes without complication. Fortunately there is no signs of active infection which is great news. Overall I do feel like she is getting very close to healing I do believe the collagen has been beneficial for her based on what I am seeing currently. She is extremely pleased to hear this  and see how things are progressing. 07/03/2019 upon evaluation today patient appears to be doing very well with regard to her wound. She continues to show signs of improvement and I am very pleased with the progress that she is made. There does not appear to be any signs of active infection at this time which is also great news. No fevers, chills, nausea, vomiting, or diarrhea. Objective Constitutional Obese and well-hydrated in no acute distress. Vitals Time Taken: 10:16 AM, Height: 68 in, Weight: 300 lbs, BMI: 45.6, Temperature: 98.8 F,  Pulse: 92 bpm, Respiratory Rate: 16 breaths/min, Blood Pressure: 128/59 mmHg. Respiratory normal breathing without difficulty. Psychiatric this patient is able to make decisions and demonstrates good insight into disease process. Alert and Oriented x 3. pleasant and cooperative. General Notes: Had actually showed signs of good granulation and epithelization. There was some callus noted that required sharp debridement there was a little bit of slough/biofilm on the surface of the wound as well that did require sharp debridement. Post debridement the wound bed appears to be doing significantly better which is excellent news. Integumentary (Hair, Skin) Wound #1 status is Open. Original cause of wound was Gradually Appeared. The wound is located on the [employer]. The wound measures 0.6cm length x 0.5cm width x 0.2cm depth; 0.236cm^2 area and 0.047cm^3 volume. There is Fat Layer (Subcutaneous Tissue) Exposed exposed. There is no tunneling or undermining noted. There is a medium amount of serosanguineous drainage noted. The wound margin is epibole. There is large (67-100%) pale granulation within the wound bed. There is a small (1-33%) amount of necrotic tissue within the wound bed including Adherent Slough. Assessment Active Problems ICD-10 Type 2 diabetes mellitus with foot ulcer [place], Brynnlie E. ([id number]) Type 2 diabetes mellitus with diabetic peripheral angiopathy without gangrene Non-pressure chronic ulcer of other part of left foot with fat layer exposed Unspecified open wound, left lower leg, initial encounter Type 2 diabetes mellitus with diabetic polyneuropathy Corns and callosities Procedures Wound #1 Pre-procedure diagnosis of Wound #1 is a Diabetic Wound/Ulcer of the Lower Extremity located on the Left,Plantar Toe Great .Severity of Tissue Pre Debridement is: Fat layer exposed. There was a Excisional Skin/Subcutaneous Tissue Debridement with a total area of 0.3 sq cm  performed by [name], Kaelon Weekes E., [name]. With the following instrument(s): Curette to remove Viable and Non-Viable tissue/material. Material removed includes Callus and Subcutaneous Tissue and after achieving pain control using Lidocaine. A time out was conducted at 10:28, prior to the start of the procedure. A Minimum amount of bleeding was controlled with Pressure. The procedure was tolerated well. Post Debridement Measurements: 0.6cm length x 0.5cm width x 0.1cm depth; 0.024cm^3 volume. Character of Wound/Ulcer Post Debridement is stable. Severity of Tissue Post Debridement is: Fat layer exposed. Post procedure Diagnosis Wound #1: Same as Pre-Procedure Plan Wound Cleansing: Wound #1 Left,Plantar Toe Great: Cleanse wound with mild soap and water No tub bath. Anesthetic (add to Medication List): Wound #1 Left,Plantar Toe Great: Topical Lidocaine 4% cream applied to wound bed prior to debridement (In Clinic Only). Primary Wound Dressing: Wound #1 Left,Plantar Toe Great: Silver Collagen Secondary Dressing: Wound #1 Left,Plantar Toe Great: Gauze and Kerlix/Conform Foam Dressing Change Frequency: Wound #1 Left,Plantar Toe Great: Change dressing every other day. Follow-up Appointments: Return Appointment in 1 week. Edema Control: Wound #1 Left,Plantar Toe Great: Elevate legs to the level of the heart and pump ankles as often as possible Off-Loading: Wound #1 Left,Plantar Toe Great: Open toe surgical shoe with peg assist.  Additional Orders / Instructions: Stop Smoking Increase protein intake. [place] suggest at this point that we go ahead and continue with the silver collagen as I feel like that is doing excellent for her the wound is improving dramatically. 2. I am also going to suggest that we continue with covering the area with foam and roll gauze. 3. I do recommend she continue to use the open toe surgical shoe with peg assist. We will see patient back for reevaluation in 1  week here in the clinic. If anything worsens or changes patient will contact our office for additional recommendations. DESHIRA, VOGELMAN ([id number]) Electronic Signature(s) Signed: 07/03/2019 5:23:52 PM By: [name] [name] Entered By: [name] on 07/03/2019 17:23:52 Melinda [name] ([id number]) -------------------------------------------------------------------------------- SuperBill Details Patient Name: Melinda [name] Date of Service: 07/03/2019 Medical Record Number: [id number] Patient Account Number: [id number] Date of Birth/Sex: 07-08-1955 (64 y.o. F) Treating RN: Army Melia Primary Care Provider: [name] Other Clinician: Referring Provider: [name] Treating Provider/Extender: [name], Rishan Oyama Weeks in Treatment: 18 Diagnosis Coding ICD-10 Codes Code Description E11.621 Type 2 diabetes mellitus with foot ulcer E11.51 Type 2 diabetes mellitus with diabetic peripheral angiopathy without gangrene 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sed S81.802A Unspecified open wound, left lower leg, initial encounter E11.42 Type 2 diabetes mellitus with diabetic polyneuropathy L84 Corns and callosities Facility Procedures CPT4 Code: [id number] Description: [id number] - DEB SUBQ TISSUE 20 SQ CM/< Modifier: Quantity: 1 CPT4 Code: Description: ICD-10 Diagnosis Description 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sed Modifier: Quantity: Physician Procedures CPT4 Code: [id number] Description: [id number] - WC PHYS SUBQ TISS 20 SQ CM Modifier: Quantity: 1 CPT4 Code: Description: ICD-10 Diagnosis Description 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sed Modifier: Quantity: Electronic Signature(s) Signed: 07/03/2019 5:24:06 PM By: [name] [name] Entered By: [name] on 07/03/2019 17:24:05

## 2019-07-03 NOTE — Telephone Encounter (Signed)
Informed patient that Dr. Bonna Gains was scoping in [place] on Fridays moved patient to [facility]

## 2019-07-04 ENCOUNTER — Encounter: Payer: Self-pay | Admitting: Cardiovascular Disease

## 2019-07-04 ENCOUNTER — Ambulatory Visit: Payer: Medicare HMO | Admitting: Cardiovascular Disease

## 2019-07-04 VITALS — BP 110/74 | HR 99 | Ht 68.5 in | Wt 291.0 lb

## 2019-07-04 DIAGNOSIS — I25118 Atherosclerotic heart disease of native coronary artery with other forms of angina pectoris: Secondary | ICD-10-CM | POA: Diagnosis not present

## 2019-07-04 DIAGNOSIS — I255 Ischemic cardiomyopathy: Secondary | ICD-10-CM

## 2019-07-04 DIAGNOSIS — L97909 Non-pressure chronic ulcer of unspecified part of unspecified lower leg with unspecified severity: Secondary | ICD-10-CM

## 2019-07-04 DIAGNOSIS — E785 Hyperlipidemia, unspecified: Secondary | ICD-10-CM

## 2019-07-04 DIAGNOSIS — I70299 Other atherosclerosis of native arteries of extremities, unspecified extremity: Secondary | ICD-10-CM | POA: Diagnosis not present

## 2019-07-04 DIAGNOSIS — I739 Peripheral vascular disease, unspecified: Secondary | ICD-10-CM

## 2019-07-04 DIAGNOSIS — Z720 Tobacco use: Secondary | ICD-10-CM

## 2019-07-04 DIAGNOSIS — I10 Essential (primary) hypertension: Secondary | ICD-10-CM

## 2019-07-04 NOTE — Progress Notes (Signed)
Cardiology Office Note  Date:  07/04/2019   ID:  Melinda [name], DOB 06-Jul-1955, MRN [id number]  PCP:  Donnie Coffin, MD   Chief Complaint  Patient presents with  . office visit    12 month F/U; Meds verbally reviewed with patient.    HPI:  64 y.o. female with h/o CAD s/p 4 vessel CABG on 06/08/2014  poorly controlled IDDM,  HTN,  ongoing tobacco abuse, 2 cig  obesity,  anxiety,  peripheral neuropathy  admitted to [facility] from 2/1-06/03/14 for NSTEMI.  After undergoing cardiac cath on 2/3 that showed severe 3 vessel disease she was transferred to [facility] from 2/3-2/13 for . 4 vessel CABG (LIMA to LAD, SVG to Diagonal, SVG to OM, and SVG to PDA) She presents today for follow-up in the clinic for her coronary artery disease, PAD, smoking, diabetes  Long discussion concerning her PAD, interventions on her legs August 2020 Interventional reports reviewed  Aug 2020 --Percutaneous transluminal angioplasty of the anterior tibial artery with 3 mm diameter by 10 cm length angioplasty balloon -- Percutaneous transluminal angioplasty of the left distal SFA with 5 mm diameter by 8 cm length Lutonix drug-coated angioplasty balloon -- Viabahn stent placement to the distal left SFA with 6 mm diameter by 5 cm length stent for greater than 50% residual stenosis after angioplasty  Repeat intervention 1 week later --Percutaneous transluminal angioplasty of right anterior tibial artery with 3 mm diameter by 22 cm length angioplasty balloon and 4 mm diameter by 6 cm Lutonix drug-coated angioplasty balloon in the proximal segment --Percutaneous transluminal angioplasty of the right tibioperoneal trunk and peroneal artery with 3 mm diameter by 22 cm length angioplasty balloon --Percutaneous transluminal angioplasty of the right SFA and popliteal arteries with a 5 mm diameter by 30 cm length and 5 mm diameter by 10 cm length Lutonix drug-coated angioplasty balloons  In follow-up she reports no claudication  symptoms in her legs  Followed by nephrology HBA1C 6.6, dramatic improvement insuln increased, numbers better Not eating as well since then  Weight peak was 314, in the past year Down to 291, In 2019 was 270, current weight still up overall  Arthritis, chest, arms, shoulders 14 cigs a day Does not want chantix  EKG personally reviewed by myself on todays visit Shows normal sinus rhythm with rate 99 bpm nonspecific ST-T wave abnormality anterolateral leads, inferior leads  Other past medical history reviewed previous toe amputation May 28, 2017 operative note from Dr. Lucky Cowboy Reporting PAD with ulceration and gangrene left foot Angioplasty of left SFA and popliteal artery Covered stent x2 to left SFA Angioplasty left posterior tibial artery    presented to [facility] ED on 06/01/14 with  chest tightness,  Cardiac cath showed 95% stenosis mLAD, occlusion ostial OM1, 70% stenosis LCx, 95% stenosis mRCA, EF 45%. She underwent successful cabg on 06/08/2014 at [facility].  Echo at [facility] showed EF 40-45%,  HK of entire inferior and inferolateral wall c/w infarction of RCA or LCx,    PMH:   has a past medical history of Anxiety, Coronary artery disease, Diabetic neuropathy ([place]), GERD (gastroesophageal reflux disease), HTN (hypertension), Hyperlipidemia LDL goal <70, IDDM (insulin dependent diabetes mellitus), Ischemic cardiomyopathy, OA (osteoarthritis) of knee, Obesity, Osteoarthritis, Osteomyelitis ([place]), Peripheral neuropathy, Peripheral vascular disease ([place]), and Tobacco abuse.  PSH:    Past Surgical History:  Procedure Laterality Date  . ABDOMINAL AORTOGRAM W/LOWER EXTREMITY N/A 10/27/2016   Procedure: Abdominal Aortogram w/Lower Extremity;  Surgeon: Nelva Bush, MD;  Location: [facility]  INVASIVE CV LAB;  Service: Cardiovascular;  Laterality: N/A;  . AMPUTATION TOE Left 10/21/2015   Procedure: AMPUTATION TOE;  Surgeon: Sharlotte Alamo, DPM;  Location: [facility] ORS;  Service: Podiatry;  Laterality: Left;  .  AMPUTATION TOE Left 05/15/2017   Procedure: AMPUTATION LEFT GREAT TOE;  Surgeon: Sharlotte Alamo, DPM;  Location: [facility] ORS;  Service: Podiatry;  Laterality: Left;  . AORTIC VALVE REPLACEMENT (AVR)/CORONARY ARTERY BYPASS GRAFTING (CABG)    . BREAST BIOPSY    . CARDIAC CATHETERIZATION  06/2014   95% stenosis mLAD, occlusion ostial OM1, 70% stenosis LCx, 95% stenosis mRCA, EF 45%.  . CORONARY ARTERY BYPASS GRAFT N/A 06/08/2014   Procedure: CORONARY ARTERY BYPASS GRAFTING (CABG);  Surgeon: Grace Isaac, MD;  Location: [place];  Service: Open Heart Surgery;  Laterality: N/A;  Times 4 using left internal mammary artery to LAD artery and endoscopically harvested bilateral saphenous vein to Obtuse Marginal, Diagonal and Posterior Descending coronary arteries.  . CT ABD W & PELVIS WO CM  06/2014   nl liver, gallbladder, spleen, mild diverticular changes, no bowel wall inflammation, appendix nl, no hernia, no other sig abnormalities  . LOWER EXTREMITY ANGIOGRAPHY Left 05/23/2017   Procedure: LOWER EXTREMITY ANGIOGRAPHY;  Surgeon: Algernon Huxley, MD;  Location: [place] CV LAB;  Service: Cardiovascular;  Laterality: Left;  . LOWER EXTREMITY ANGIOGRAPHY Left 05/28/2017   Procedure: LOWER EXTREMITY ANGIOGRAPHY;  Surgeon: Algernon Huxley, MD;  Location: [place] CV LAB;  Service: Cardiovascular;  Laterality: Left;  . LOWER EXTREMITY ANGIOGRAPHY Left 12/16/2018   Procedure: LOWER EXTREMITY ANGIOGRAPHY;  Surgeon: Algernon Huxley, MD;  Location: [place] CV LAB;  Service: Cardiovascular;  Laterality: Left;  . LOWER EXTREMITY ANGIOGRAPHY Right 12/23/2018   Procedure: LOWER EXTREMITY ANGIOGRAPHY;  Surgeon: Algernon Huxley, MD;  Location: [place] CV LAB;  Service: Cardiovascular;  Laterality: Right;  . TEE WITHOUT CARDIOVERSION N/A 06/08/2014   Procedure: TRANSESOPHAGEAL ECHOCARDIOGRAM (TEE);  Surgeon: Grace Isaac, MD;  Location: [place];  Service: Open Heart Surgery;  Laterality: N/A;  . TOE AMPUTATION Right  12/2011   rt middle toe  . US ECHOCARDIOGRAPHY  06/2014   EF 50-55%, HK of inf/post/inferolat walls, Ao sclerosis    Current Outpatient Medications on File Prior to Visit  Medication Sig Dispense Refill  . amLODipine (NORVASC) 5 MG tablet     . aspirin 81 MG EC tablet Take 1 tablet (81 mg total) by mouth daily. 30 tablet 0  . atorvastatin (LIPITOR) 80 MG tablet TAKE 1 TABLET EVERY DAY  AT  6  PM 90 tablet 0  . clopidogrel (PLAVIX) 75 MG tablet Take 1 tablet (75 mg total) by mouth daily. 90 tablet 3  . DULoxetine (CYMBALTA) 60 MG capsule     . furosemide (LASIX) 20 MG tablet TAKE 1 TABLET (20 MG TOTAL) BY MOUTH DAILY AS NEEDED. 90 tablet 2  . insulin aspart (NOVOLOG) 100 UNIT/ML injection Inject 3 Units into the skin 3 (three) times daily with meals. (Patient taking differently: Inject 6 Units into the skin 3 (three) times daily with meals. ) 2.7 mL 0  . insulin glargine (LANTUS) 100 UNIT/ML injection Inject 0.6 mLs (60 Units total) into the skin 2 (two) times daily. (Patient taking differently: Inject 70 Units into the skin 2 (two) times daily. )    . lidocaine (LIDODERM) 5 %     . lisinopril (PRINIVIL,ZESTRIL) 5 MG tablet Take 1 tablet (5 mg total) by mouth daily. 30 tablet 0  .  metoprolol tartrate (LOPRESSOR) 25 MG tablet Take 0.5 tablets (12.5 mg total) by mouth 2 (two) times daily. (BETA BLOCKER) 180 tablet 3  . nicotine (NICODERM CQ - DOSED IN MG/24 HOURS) 21 mg/24hr patch Place 1 patch (21 mg total) onto the skin daily. 28 patch 0  . oxybutynin (DITROPAN) 5 MG tablet Take 5 mg by mouth 2 (two) times daily.     . potassium chloride SA (KLOR-CON) 20 MEQ tablet TAKE 1 TABLET (20 MEQ TOTAL) BY MOUTH DAILY AS NEEDED. Please schedule office visit for further refills. Thank you! 90 tablet 0  . pregabalin (LYRICA) 100 MG capsule     . tiZANidine (ZANAFLEX) 4 MG tablet Take 4 mg by mouth 3 (three) times daily.    Marland Kitchen VICTOZA 18 MG/3ML SOPN      No current facility-administered medications on file  prior to visit.     Allergies:   Patient has no known allergies.   Social History:  The patient  reports that she has been smoking cigarettes. She has a 30.00 pack-year smoking history. She has never used smokeless tobacco. She reports previous drug use. She reports that she does not drink alcohol.   Family History:   family history includes Asthma in an other family member; CAD in her father and mother; Diabetes in her father and mother; Hypertension in her mother; Sickle cell trait in an other family member.    Review of Systems: Review of Systems  Constitutional: Negative.   Respiratory: Negative.   Cardiovascular: Negative.   Gastrointestinal: Negative.   Musculoskeletal: Positive for back pain.       Pain in the left foot  Neurological: Negative.   Psychiatric/Behavioral: Negative.   All other systems reviewed and are negative.    PHYSICAL EXAM: VS:  BP 110/74 (BP Location: Left Arm, Patient Position: Sitting, Cuff Size: Large)   Pulse 99   Ht 5' 8.5" (1.74 m)   Wt 291 lb (132 kg)   SpO2 98%   BMI 43.60 kg/m  , BMI Body mass index is 43.6 kg/m. Constitutional:  oriented to person, place, and time. No distress.  HENT:  Head: Normocephalic and atraumatic.  Eyes:  no discharge. No scleral icterus.  Neck: Normal range of motion. Neck supple. No JVD present.  Cardiovascular: Normal rate, regular rhythm, normal heart sounds and intact distal pulses. Exam reveals no gallop and no friction rub. No edema No murmur heard. Pulmonary/Chest: Effort normal and breath sounds normal. No stridor. No respiratory distress.  no wheezes.  no rales.  no tenderness.  Abdominal: Soft.  no distension.  no tenderness.  Musculoskeletal: Normal range of motion.  no  tenderness or deformity.  Left foot in a walking shoe Neurological:  normal muscle tone. Coordination normal. No atrophy Skin: Skin is warm and dry. No rash noted. not diaphoretic.  Psychiatric:  normal mood and affect. behavior  is normal. Thought content normal.    Recent Labs: 12/23/2018: BUN 25; Creatinine, Ser 1.45    Lipid Panel Lab Results  Component Value Date   CHOL 92 03/14/2018   HDL 39 (L) 03/14/2018   LDLCALC 39 03/14/2018   TRIG 71 03/14/2018      Wt Readings from Last 3 Encounters:  07/04/19 291 lb (132 kg)  04/30/19 297 lb (134.7 kg)  03/21/19 (!) 304 lb 3.2 oz (138 kg)       ASSESSMENT AND PLAN:  Coronary artery disease involving native coronary artery of native heart without angina pectoris -  Stressed  the importance of aggressive diabetes control Tolerating her Lipitor, antiplatelet therapy Stressed importance of smoking cessation  Essential hypertension -  Blood pressure stable, no medication changes made  Ischemic cardiomyopathy -  Appears euvolemic, Consider repeat echocardiogram and follow-up visit  Claudication [facility]) -  Recent PV procedures discussed from August 2020 Stressed the need for smoking cessation  S/P CABG x 4 Denies anginal symptoms, stressed the need for smoking cessation Diabetes numbers better, lipids at goal  Type 2 diabetes mellitus with other circulatory complication, with long-term current use of insulin (HCC) Hemoglobin A1c 6.6 Dramatic improvement, Weight has trended upwards 20 pounds in the past year Recommended strict diet  Tobacco abuse Long discussion concerning smoking cessation 14 cigarettes/day More than 5 minutes spent discussing various strategies on She does not want Chantix   Total encounter time more than 45 minutes  Greater than 50% was spent in counseling and coordination of care with the patient    Disposition:   F/U  6 months   Orders Placed This Encounter  Procedures  . EKG 12-Lead     Signed, Esmond Plants, M.D., Ph.D. 07/04/2019  [place], [place]

## 2019-07-04 NOTE — Patient Instructions (Signed)
Smoking cessation  Medication Instructions:  No changes  If you need a refill on your cardiac medications before your next appointment, please call your pharmacy.    Lab work: No new labs needed   If you have labs (blood work) drawn today and your tests are completely normal, you will receive your results only by: Marland Kitchen MyChart Message (if you have MyChart) OR . A paper copy in the mail If you have any lab test that is abnormal or we need to change your treatment, we will call you to review the results.   Testing/Procedures: No new testing needed   Follow-Up: At [facility], you and your health needs are our priority.  As part of our continuing mission to provide you with exceptional heart care, we have created designated Provider Care Teams.  These Care Teams include your primary Cardiologist (physician) and Advanced Practice Providers (APPs -  Physician Assistants and Nurse Practitioners) who all work together to provide you with the care you need, when you need it.  . You will need a follow up appointment in 12 months   . Providers on your designated Care Team:   . Murray Hodgkins, NP . Christell Faith, [name] . Marrianne Mood, [name]  Any Other Special Instructions Will Be Listed Below (If Applicable).  For educational health videos Log in to : [email or web address] Or : [email or web address], password : [email or web address]

## 2019-07-07 ENCOUNTER — Other Ambulatory Visit: Payer: Self-pay

## 2019-07-07 MED ORDER — FUROSEMIDE 20 MG PO TABS: ORAL_TABLET | ORAL | 2 refills | Status: DC

## 2019-07-10 ENCOUNTER — Encounter: Payer: Medicare HMO | Admitting: Physician Assistant

## 2019-07-10 ENCOUNTER — Other Ambulatory Visit: Payer: Self-pay

## 2019-07-10 DIAGNOSIS — E11621 Type 2 diabetes mellitus with foot ulcer: Secondary | ICD-10-CM | POA: Diagnosis not present

## 2019-07-10 NOTE — Progress Notes (Signed)
VALLON, KUSEL ([id number]) Visit Report for 07/10/2019 Arrival Information Details Patient Name: Melinda [name], Melinda [name] Date of Service: 07/10/2019 10:15 AM Medical Record Number: [id number] Patient Account Number: [id number] Date of Birth/Sex: 19-Oct-1955 (64 y.o. F) Treating RN: Army Melia Primary Care Su Duma: [name] Other Clinician: Referring Calvin Jablonowski: [name] Treating Anzlee Hinesley/Extender: [name], [name]: 73 Visit Information History Since Last Visit Added or deleted any medications: No Patient Arrived: Ambulatory Any new allergies or adverse reactions: No Arrival Time: 10:17 Had a fall or experienced change in No Accompanied By: self activities of daily living that may affect Transfer Assistance: None risk of falls: Patient Identification Verified: Yes Signs or symptoms of abuse/neglect since last visito No Secondary Verification Process Completed: Yes Hospitalized since last visit: No Patient Has Alerts: Yes Implantable device outside of the clinic excluding No Patient Alerts: Patient on Blood Thinner cellular tissue based products placed in the center Plavix, Aspirin 81mg  since last visit: ABI's L .88/R .71 Pain Present Now: No Electronic Signature(s) Signed: 07/10/2019 3:53:04 PM By: Lorine Bears RCP, RRT, CHT Entered By: Lorine Bears on 07/10/2019 10:18:25 Melinda [name] ([id number]) -------------------------------------------------------------------------------- Encounter Discharge Information Details Patient Name: Melinda [name] Date of Service: 07/10/2019 10:15 AM Medical Record Number: [id number] Patient Account Number: [id number] Date of Birth/Sex: May 22, 1955 (64 y.o. F) Treating RN: Army Melia Primary Care Wenceslaus Gist: [name] Other Clinician: Referring Jeson Camacho: [name] Treating Abriella Filkins/Extender: [name], [name]: 19 Encounter Discharge Information Items Post Procedure  Vitals Discharge Condition: Stable Temperature (F): 98.4 Ambulatory Status: Ambulatory Pulse (bpm): 90 Discharge Destination: Home Respiratory Rate (breaths/min): 16 Transportation: Private Auto Blood Pressure (mmHg): 126/46 Accompanied By: self Schedule Follow-up Appointment: Yes Clinical Summary of Care: Electronic Signature(s) Signed: 07/10/2019 3:43:50 PM By: Army Melia Entered By: Army Melia on 07/10/2019 11:01:52 Melinda [name] ([id number]) -------------------------------------------------------------------------------- Lower Extremity Assessment Details Patient Name: Melinda [name] Date of Service: 07/10/2019 10:15 AM Medical Record Number: [id number] Patient Account Number: [id number] Date of Birth/Sex: 1956-03-09 (64 y.o. F) Treating RN: [name] Primary Care Tallon Gertz: [name] Other Clinician: Referring Takoda Janowiak: [name] Treating Dailen Mcclish/Extender: [name] Weeks in Treatment: 19 Vascular Assessment Pulses: Dorsalis Pedis Palpable: [Left:Yes] Electronic Signature(s) Signed: 07/10/2019 4:33:53 PM By: [name], [name], RN, [name], [name] RN, [name] Entered By: [name], [name], RN, [name], [name] on 07/10/2019 10:39:08 Melinda [name] ([id number]) -------------------------------------------------------------------------------- Multi Wound Chart Details Patient Name: Melinda [name] Date of Service: 07/10/2019 10:15 AM Medical Record Number: [id number] Patient Account Number: [id number] Date of Birth/Sex: 10/03/1955 (64 y.o. F) Treating RN: Army Melia Primary Care Cristopher Ciccarelli: [name] Other Clinician: Referring Yashas Camilli: [name] Treating Skylier Kretschmer/Extender: [name], [name]: 19 Vital Signs Height(in): 68 Pulse(bpm): 90 Weight(lbs): 300 Blood Pressure(mmHg): 126/46 Body Mass Index(BMI): 46 Temperature(F): 98.4 Respiratory Rate(breaths/min): 16 Photos: [N/A:N/A] Wound Location: Left, Plantar Metatarsal head first N/A N/A Wounding Event:  Gradually Appeared N/A N/A Primary Etiology: Diabetic Wound/Ulcer of the Lower N/A N/A Extremity Comorbid History: Cataracts, Coronary Artery Disease, N/A N/A Hypertension, Myocardial Infarction, Peripheral Venous Disease, Type II Diabetes, Osteoarthritis, Osteomyelitis, Neuropathy Date Acquired: 11/30/2018 N/A N/A Weeks of Treatment: 19 N/A N/A Wound Status: Open N/A N/A Measurements L x W x D (cm) 0.5x0.4x0.2 N/A N/A Area (cm) : 0.157 N/A N/A Volume (cm) : 0.031 N/A N/A % Reduction in Area: 95.00% N/A N/A % Reduction in Volume: 98.00% N/A N/A Classification: Grade 2 N/A N/A Exudate Amount: Medium N/A N/A Exudate Type: Serosanguineous N/A N/A Exudate Color:  red, brown N/A N/A Wound Margin: Epibole N/A N/A Granulation Amount: Large (67-100%) N/A N/A Granulation Quality: Pale N/A N/A Necrotic Amount: None Present (0%) N/A N/A Exposed Structures: Fat Layer (Subcutaneous Tissue) N/A N/A Exposed: Yes Fascia: No Tendon: No Muscle: No Joint: No Bone: No Epithelialization: Large (67-100%) N/A N/A Assessment Notes: callus around wound N/A N/A Treatment Notes CAPRICE, TRUMBULL ([id number]) Electronic Signature(s) Signed: 07/10/2019 3:43:50 PM By: Army Melia Entered By: Army Melia on 07/10/2019 10:59:15 Melinda [name] ([id number]) -------------------------------------------------------------------------------- [place] Plan Details Patient Name: Melinda [name] Date of Service: 07/10/2019 10:15 AM Medical Record Number: [id number] Patient Account Number: [id number] Date of Birth/Sex: 01-30-1956 (64 y.o. F) Treating RN: Army Melia Primary Care Jeffrey Voth: [name] Other Clinician: Referring Shaylyn Bawa: [name] Treating Aneta Hendershott/Extender: [name], [name]: 19 Active Inactive Medication Nursing Diagnoses: Knowledge deficit related to medication safety: actual or potential Goals: Patient/caregiver will demonstrate understanding of all  current medications Date Initiated: 02/26/2019 Target Resolution Date: 02/26/2019 Goal Status: Active Interventions: Assess for medication contraindications each visit where new medications are prescribed Notes: Necrotic Tissue Nursing Diagnoses: Impaired tissue integrity related to necrotic/devitalized tissue Goals: Necrotic/devitalized tissue will be minimized in the wound bed Date Initiated: 02/26/2019 Target Resolution Date: 03/06/2019 Goal Status: Active Interventions: Assess patient pain level pre-, during and post procedure and prior to discharge Treatment Activities: Apply topical anesthetic as ordered : 02/26/2019 Notes: Pressure Nursing Diagnoses: Knowledge deficit related to causes and risk factors for pressure ulcer development Knowledge deficit related to management of pressures ulcers Goals: Patient will remain free from development of additional pressure ulcers Date Initiated: 02/26/2019 Target Resolution Date: 02/26/2019 Goal Status: Active Patient will remain free of pressure ulcers Date Initiated: 02/26/2019 Target Resolution Date: 02/26/2019 Goal Status: Active Patient/caregiver will verbalize understanding of pressure ulcer management Date Initiated: 02/26/2019 Target Resolution Date: 02/26/2019 Goal Status: Active JACKY, MIELE ([id number]) Interventions: Assess: immobility, friction, shearing, incontinence upon admission and as needed Notes: Wound/Skin Impairment Nursing Diagnoses: Impaired tissue integrity Goals: Ulcer/skin breakdown will have a volume reduction of 30% by week 4 Date Initiated: 02/26/2019 Target Resolution Date: 03/29/2019 Goal Status: Active Interventions: Provide education on ulcer and skin care Treatment Activities: Topical wound management initiated : 02/26/2019 Notes: Electronic Signature(s) Signed: 07/10/2019 3:43:50 PM By: Army Melia Entered By: Army Melia on 07/10/2019 10:59:07 Melinda [name]  ([id number]) -------------------------------------------------------------------------------- Pain Assessment Details Patient Name: Melinda [name] Date of Service: 07/10/2019 10:15 AM Medical Record Number: [id number] Patient Account Number: [id number] Date of Birth/Sex: January 28, 1956 (64 y.o. F) Treating RN: [name] Primary Care Kristiane Morsch: [name] Other Clinician: Referring Cambridge Deleo: [name] Treating Laurance Heide/Extender: [name], [name]: 19 Active Problems Location of Pain Severity and Description of Pain Patient Has Paino No Site Locations Pain Management and Medication Current Pain Management: Notes Patient denies pain at this time. Electronic Signature(s) Signed: 07/10/2019 4:33:53 PM By: [name], [name], RN, [name], [name] RN, [name] Entered By: [name], [name], RN, [name], [name] on 07/10/2019 10:33:58 Melinda [name] ([id number]) -------------------------------------------------------------------------------- Patient/Caregiver Education Details Patient Name: Melinda [name] Date of Service: 07/10/2019 10:15 AM Medical Record Number: [id number] Patient Account Number: [id number] Date of Birth/Gender: 1955/06/07 (64 y.o. F) Treating RN: Army Melia Primary Care Physician: [name] Other Clinician: Referring Physician: [name] Treating Physician/Extender: Sharalyn Ink in Treatment: 27 Education Assessment Education Provided To: Patient Education Topics Provided Wound/Skin Impairment: Handouts: Caring for Your Ulcer Methods: Demonstration, Explain/Verbal Responses: State content correctly Electronic Signature(s) Signed: 07/10/2019 3:43:50 PM  By: Army Melia Entered By: Army Melia on 07/10/2019 11:01:21 Melinda [name] ([id number]) -------------------------------------------------------------------------------- Wound Assessment Details Patient Name: Melinda [name] Date of Service: 07/10/2019 10:15 AM Medical Record Number: [id number] Patient  Account Number: [id number] Date of Birth/Sex: 12/23/1955 (64 y.o. F) Treating RN: [name] Primary Care Korby Ratay: [name] Other Clinician: Referring Dhana Totton: [name] Treating Haider Hornaday/Extender: [name], [name]: 19 Wound Status Wound Number: 1 Primary Diabetic Wound/Ulcer of the Lower Extremity Etiology: Wound Location: Left, Plantar Metatarsal head first Wound Open Wounding Event: Gradually Appeared Status: Date Acquired: 11/30/2018 Comorbid Cataracts, Coronary Artery Disease, Hypertension, Weeks Of Treatment: 19 History: Myocardial Infarction, Peripheral Venous Disease, Type II Clustered Wound: No Diabetes, Osteoarthritis, Osteomyelitis, Neuropathy Photos Wound Measurements Length: (cm) 0.5 % Width: (cm) 0.4 % Depth: (cm) 0.2 E Area: (cm) 0.157 Volume: (cm) 0.031 Reduction in Area: 95% Reduction in Volume: 98% pithelialization: Large (67-100%) Tunneling: No Undermining: No Wound Description Classification: Grade 2 Wound Margin: Epibole Exudate Amount: Medium Exudate Type: Serosanguineous Exudate Color: red, brown Foul Odor After Cleansing: No Slough/Fibrino Yes Wound Bed Granulation Amount: Large (67-100%) Exposed Structure Granulation Quality: Pale Fascia Exposed: No Necrotic Amount: None Present (0%) Fat Layer (Subcutaneous Tissue) Exposed: Yes Tendon Exposed: No Muscle Exposed: No Joint Exposed: No Bone Exposed: No Assessment Notes callus around wound Treatment Notes Wound #1 (Left, Plantar Metatarsal head first) Notes prisma, Foam, conform to foot YASLINE, WILEY ([id number]) Electronic Signature(s) Signed: 07/10/2019 4:33:53 PM By: [name], [name], RN, [name], [name] RN, [name] Entered By: [name], [name], RN, [name], [name] on 07/10/2019 10:38:48 Melinda [name] ([id number]) -------------------------------------------------------------------------------- Vitals Details Patient Name: Melinda [name] Date of Service: 07/10/2019 10:15  AM Medical Record Number: [id number] Patient Account Number: [id number] Date of Birth/Sex: 09/20/1955 (64 y.o. F) Treating RN: Army Melia Primary Care Bradley Bostelman: [name] Other Clinician: Referring Penelope Fittro: [name] Treating Nabilah Davoli/Extender: [name], [name]: 19 Vital Signs Time Taken: 10:15 Temperature (F): 98.4 Height (in): 68 Pulse (bpm): 90 Weight (lbs): 300 Respiratory Rate (breaths/min): 16 Body Mass Index (BMI): 45.6 Blood Pressure (mmHg): 126/46 Reference Range: 80 - 120 mg / dl Electronic Signature(s) Signed: 07/10/2019 3:53:04 PM By: Lorine Bears RCP, RRT, CHT Entered By: Lorine Bears on 07/10/2019 10:19:47

## 2019-07-11 NOTE — Progress Notes (Addendum)
AMARIZ, GRENZ ([id number]) Visit Report for 07/10/2019 Chief Complaint Document Details Patient Name: Melinda [name], Melinda [name] Date of Service: 07/10/2019 10:15 AM Medical Record Number: [id number] Patient Account Number: [id number] Date of Birth/Sex: 01/03/1956 (64 y.o. F) Treating RN: Army Melia Primary Care Provider: [name] Other Clinician: Referring Provider: [name] Treating Provider/Extender: [name], Safira Proffit Weeks in Treatment: 19 Information Obtained from: Patient Chief Complaint Left great toe ulcer and left LE skin tear Electronic Signature(s) Signed: 07/10/2019 1:30:11 PM By: [name] [name] Entered By: [name] on 07/10/2019 13:30:11 Melinda [name] ([id number]) -------------------------------------------------------------------------------- Debridement Details Patient Name: Melinda [name] Date of Service: 07/10/2019 10:15 AM Medical Record Number: [id number] Patient Account Number: [id number] Date of Birth/Sex: Jun 06, 1955 (64 y.o. F) Treating RN: Army Melia Primary Care Provider: [name] Other Clinician: Referring Provider: [name] Treating Provider/Extender: [name], Ikesha Siller Weeks in Treatment: 19 Debridement Performed for Wound #1 Left,Plantar Metatarsal head first Assessment: Performed By: Physician [name], Kregg Cihlar [name], [name] Debridement Type: Debridement Severity of Tissue Pre Debridement: Fat layer exposed Level of Consciousness (Pre- Awake and Alert procedure): Pre-procedure Verification/Time Out Yes - 10:58 Taken: Start Time: 10:59 Pain Control: Lidocaine Total Area Debrided (L x W): 0.5 (cm) x 0.4 (cm) = 0.2 (cm) Tissue and other material debrided: Viable, Non-Viable, Callus, Subcutaneous Level: Skin/Subcutaneous Tissue Debridement Description: Excisional Instrument: Curette Bleeding: Minimum Hemostasis Achieved: Pressure End Time: 11:00 Response to Treatment: Procedure was tolerated well Level of Consciousness  (Post- Awake and Alert procedure): Post Debridement Measurements of Total Wound Length: (cm) 0.5 Width: (cm) 0.4 Depth: (cm) 0.2 Volume: (cm) 0.031 Character of Wound/Ulcer Post Debridement: Stable Severity of Tissue Post Debridement: Fat layer exposed Post Procedure Diagnosis Same as Pre-procedure Electronic Signature(s) Signed: 07/10/2019 3:43:50 PM By: Army Melia Signed: 07/11/2019 4:19:26 PM By: [name] [name] Entered By: Army Melia on 07/10/2019 11:00:05 Melinda [name] ([id number]) -------------------------------------------------------------------------------- HPI Details Patient Name: Melinda [name] Date of Service: 07/10/2019 10:15 AM Medical Record Number: [id number] Patient Account Number: [id number] Date of Birth/Sex: [birth year] (64 y.o. F) Treating RN: Army Melia Primary Care Provider: [name] Other Clinician: Referring Provider: [name] Treating Provider/Extender: [name], Shelvy Heckert Weeks in Treatment: 19 History of Present Illness HPI Description: ADMISSION 02/26/2019 Patient is a 64 year old type II diabetic on insulin with significant polyneuropathy. She has been followed by Dr. Sherren Mocha cline of podiatry for problems related to her feet dating back to the early part of 2019 as I can review in [place] link. 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 Subsequently she was seen by Dr. Lucky Cowboy of vascular surgery and had stents x2 placed in her left SFA as well as left SFA angioplasties on 05/20/2017. She was noted to have a wound on her left foot in October 2019. In August 2020 on 8/24 she underwent a right anterior tibial artery angioplasty a right tibioperoneal trunk angioplasty and a right SFA angioplasty. The patient states that she developed a left great toe wound in August which is at the base of her previous partial amputation in this area. She tells [name] that she has had a right great toe wound since  December 2019 and she has been using Santyl to both of these areas that she received from a fellow parishioner at her church. By enlarge she has been using Neosporin to these areas and not offloading them specifically Arterial studies on 9/22 showed an ABI on the right of 0.71 with  triphasic waveforms on the left at 0.88 with triphasic and biphasic waveforms. TBI's on the right and 0.44 and on the left at 1.05. 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 11//20; x-rays I did last week were both negative for osteomyelitis. She has a fairly large wound at the base of her left first toe and a small punched out area on the right first toe. We use silver alginate last week 03/13/2019 upon evaluation today patient appears to be doing okay with regard to her wounds at this point. She does have some callus buildup noted upon evaluation at this point. Fortunately there is no evidence of active infection which is also good news. I am going to have to perform some debridement to clear away some of the necrotic tissue today. 03/25/2019 on evaluation today patient appears to be doing well with regard to her foot ulcers. She has been tolerating the dressing changes without complication. Fortunately there is no signs of active infection at this time. Her left foot ulcer actually seems to be doing excellent no debridement even necessary today I am good have to perform some debridement on the right great toe. 04/08/19 on evaluation today patient actually appears to be doing well with regard to her wounds. In fact on the right this appears to be completely healed on the left this is measuring smaller although there still like callous around the edges of the wound. Fortunately there's no evidence of active infection at this time there is some hyper  granulation. 04/15/2019 on evaluation today patient actually appears to be doing well with regard to her toe ulcer. This seems to be showing signs of excellent granulation there is minimal slough/biofilm on the surface of the wound. She does have a significant amount of callus around the edges of the wound but at the same time I feel like that this is something we can easily pared down without any complication. Fortunately there is no evidence of active infection at this point. No fevers, chills, nausea, vomiting, or diarrhea. 04/22/2019 on evaluation today patient appears to be doing somewhat better in regard to her wound. She has been tolerating the dressing changes without complication. There is some callus noted at this point this can require some sharp debridement which I discussed with the patient as well. We will go ahead and proceed with debridement today to try to clear away some of this necrotic callus as well as clean off the biofilm/slough from the surface of the wound. 12/29-Patient returns at 1 week with regards to her left plantar foot wound which seems to be doing well, the callus was debrided around the wound the last time and seems to be doing much better since. Apparently it standing smaller, patient is a little discomforted by having to come every week to the clinic but she agrees to do that 05/06/19 on evaluation today patient actually appears to be doing well overall with regard to her plantar foot ulcer. She does have some callous buildup today but nonetheless this does not appear to be showing any signs of active infection at this time which is great news. The base of the wound does seem to be much healthier than what it was last time I saw her. No fevers, chills, nausea, or vomiting noted at this time. 05/13/2019 on evaluation today patient appears to be doing well with regard to her  plantar foot ulcer. She has been tolerating the dressing changes without complication. In fact I  am not even sure there is anything that is going require sharp debridement at this point today which is also good news. Fortunately there is no signs of active infection at this time. No fevers, chills, nausea, vomiting, or diarrhea. 05/19/2018 upon evaluation today patient actually appears to be doing excellent in regard to her wound on the plantar foot. She has been tolerating the dressing changes without complication. Fortunately there is no signs of active infection at this time which is good news. No fevers, chills, nausea, vomiting, or diarrhea. Melinda [name], Melinda [name] ([id number]) 05/27/2019 upon evaluation today patient appears to be doing excellent in regard to her foot ulcer. She has been tolerating the dressing changes without complication. Fortunately there is no evidence of active infection at this time which is good news. Overall she seems to be showing signs of excellent epithelization which is also excellent news. 06/13/2019 upon evaluation today patient appears to be doing well with regard to her left plantar foot ulcer. She has been tolerating the dressing changes without complication. Fortunately there is no signs of active infection at this time. She does not seem to be having too much drainage at this point which is also excellent news. Overall very pleased with how things have progressed. She does have a lot of callus on the right great toe but this does not seem to be 80 whereas significant as what were dealing with on the left. In fact the toe actually appears to be still healed as far as I am aware. There is no signs of active infection at this time. She does want to see if I can pare away some of the callus which I think is definitely something I can do for her today. 06/25/2019 upon evaluation today patient appears to be doing excellent in regard to her plantar foot wound. She has been tolerating the dressing changes without complication. Fortunately there is no signs of active  infection which is great news. Overall I do feel like she is getting very close to healing I do believe the collagen has been beneficial for her based on what I am seeing currently. She is extremely pleased to hear this and see how things are progressing. 07/03/2019 upon evaluation today patient appears to be doing very well with regard to her wound. She continues to show signs of improvement and I am very pleased with the progress that she is made. There does not appear to be any signs of active infection at this time which is also great news. No fevers, chills, nausea, vomiting, or diarrhea. 07/10/19 upon evaluation today patient appears to be making excellent progress. She is measuring better and overall seems to be doing quite well. I'm very pleased in this regard. There's no evidence of active infection at this time which is great news. No fevers, chills, nausea, or vomiting noted at this time. Electronic Signature(s) Signed: 07/13/2019 5:27:24 PM By: [name] [name] Entered By: [name] on 07/13/2019 17:01:02 Melinda [name] ([id number]) -------------------------------------------------------------------------------- Physical Exam Details Patient Name: Melinda [name] Date of Service: 07/10/2019 10:15 AM Medical Record Number: [id number] Patient Account Number: [id number] Date of Birth/Sex: January 30, 1956 (64 y.o. F) Treating RN: Army Melia Primary Care Provider: [name] Other Clinician: Referring Provider: [name] Treating Provider/Extender: [name], Elanna Bert Weeks in Treatment: 39 Constitutional Well-nourished and well-hydrated in no acute distress. Respiratory normal breathing without difficulty. Psychiatric this patient is  able to make decisions and demonstrates good insight into disease process. Alert and Oriented x 3. pleasant and cooperative. Notes Patient's wound bed upon inspection today did require some sharp agreement to remove callous around the edges the  wound she tolerated this today without complication post debridement wound bed appears to be doing much better. Electronic Signature(s) Signed: 07/13/2019 5:27:24 PM By: [name] [name] Entered By: [name] on 07/13/2019 17:01:21 Melinda [name] ([id number]) -------------------------------------------------------------------------------- Physician Orders Details Patient Name: Melinda [name] Date of Service: 07/10/2019 10:15 AM Medical Record Number: [id number] Patient Account Number: [id number] Date of Birth/Sex: 17-Oct-1955 (64 y.o. F) Treating RN: Army Melia Primary Care Provider: [name] Other Clinician: Referring Provider: [name] Treating Provider/Extender: [name], Collyn Selk Weeks in Treatment: 55 Verbal / Phone Orders: No Diagnosis Coding Wound Cleansing Wound #1 Left,Plantar Metatarsal head first o Cleanse wound with mild soap and water o No tub bath. Anesthetic (add to Medication List) Wound #1 Left,Plantar Metatarsal head first o Topical Lidocaine 4% cream applied to wound bed prior to debridement (In Clinic Only). Primary Wound Dressing Wound #1 Left,Plantar Metatarsal head first o Silver Collagen Secondary Dressing Wound #1 Left,Plantar Metatarsal head first o Gauze and Kerlix/Conform o Foam Dressing Change Frequency Wound #1 Left,Plantar Metatarsal head first o Change dressing every other day. Follow-up Appointments o Return Appointment in 1 week. Edema Control Wound #1 Left,Plantar Metatarsal head first o Elevate legs to the level of the heart and pump ankles as often as possible Off-Loading Wound #1 Left,Plantar Metatarsal head first o Open toe surgical shoe with peg assist. Additional Orders / Instructions o Stop Smoking o Increase protein intake. Electronic Signature(s) Signed: 07/10/2019 3:43:50 PM By: Army Melia Signed: 07/11/2019 4:19:26 PM By: [name] [name] Entered By: Army Melia on 07/10/2019  11:00:37 Melinda [name] ([id number]) -------------------------------------------------------------------------------- Problem List Details Patient Name: SUJEI, RYLE. Date of Service: 07/10/2019 10:15 AM Medical Record Number: [id number] Patient Account Number: [id number] Date of Birth/Sex: June 19, 1955 (64 y.o. F) Treating RN: Army Melia Primary Care Provider: [name] Other Clinician: Referring Provider: [name] Treating Provider/Extender: [name], Dylin Breeden Weeks in Treatment: 19 Active Problems ICD-10 Evaluated Encounter Code Description Active Date Today Diagnosis E11.621 Type 2 diabetes mellitus with foot ulcer 02/26/2019 No Yes E11.51 Type 2 diabetes mellitus with diabetic peripheral angiopathy without 02/26/2019 No Yes gangrene 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sed 02/26/2019 No Yes S81.802A Unspecified open wound, left lower leg, initial encounter 04/15/2019 No Yes E11.42 Type 2 diabetes mellitus with diabetic polyneuropathy 02/26/2019 No Yes L84 Corns and callosities 06/13/2019 No Yes Inactive Problems Resolved Problems ICD-10 Code Description Active Date Resolved Date L97.512 Non-pressure chronic ulcer of other part of right foot with fat layer exposed 02/26/2019 02/26/2019 Electronic Signature(s) Signed: 07/10/2019 1:30:04 PM By: [name] [name] Entered By: [name] on 07/10/2019 13:30:03 Melinda [name] ([id number]) -------------------------------------------------------------------------------- Progress Note Details Patient Name: Melinda [name] Date of Service: 07/10/2019 10:15 AM Medical Record Number: [id number] Patient Account Number: [id number] Date of Birth/Sex: 1955/09/04 (64 y.o. F) Treating RN: Army Melia Primary Care Provider: [name] Other Clinician: Referring Provider: [name] Treating Provider/Extender: [name], Adrean Findlay Weeks in Treatment: 19 Subjective Chief  Complaint Information obtained from Patient Left great toe ulcer and left LE skin tear History of Present Illness (HPI) ADMISSION 02/26/2019 Patient is a 64 year old type II diabetic on insulin with significant polyneuropathy. She has been followed by Dr. Sherren Mocha cline  of podiatry for problems related to her feet dating back to the early part of 2019 as I can review in [place] link. 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 Subsequently she was seen by Dr. Lucky Cowboy of vascular surgery and had stents x2 placed in her left SFA as well as left SFA angioplasties on 05/20/2017. She was noted to have a wound on her left foot in October 2019. In August 2020 on 8/24 she underwent a right anterior tibial artery angioplasty a right tibioperoneal trunk angioplasty and a right SFA angioplasty. The patient states that she developed a left great toe wound in August which is at the base of her previous partial amputation in this area. She tells [name] that she has had a right great toe wound since December 2019 and she has been using Santyl to both of these areas that she received from a fellow parishioner at her church. By enlarge she has been using Neosporin to these areas and not offloading them specifically Arterial studies on 9/22 showed an ABI on the right of 0.71 with triphasic waveforms on the left at 0.88 with triphasic and biphasic waveforms. TBI's on the right and 0.44 and on the left at 1.05. 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 11//20; x-rays I did last week were both negative for osteomyelitis. She has a fairly large wound at the base of her left first toe and a small punched out area on the right first toe. We use silver alginate last week 03/13/2019 upon evaluation today patient appears to  be doing okay with regard to her wounds at this point. She does have some callus buildup noted upon evaluation at this point. Fortunately there is no evidence of active infection which is also good news. I am going to have to perform some debridement to clear away some of the necrotic tissue today. 03/25/2019 on evaluation today patient appears to be doing well with regard to her foot ulcers. She has been tolerating the dressing changes without complication. Fortunately there is no signs of active infection at this time. Her left foot ulcer actually seems to be doing excellent no debridement even necessary today I am good have to perform some debridement on the right great toe. 04/08/19 on evaluation today patient actually appears to be doing well with regard to her wounds. In fact on the right this appears to be completely healed on the left this is measuring smaller although there still like callous around the edges of the wound. Fortunately there's no evidence of active infection at this time there is some hyper granulation. 04/15/2019 on evaluation today patient actually appears to be doing well with regard to her toe ulcer. This seems to be showing signs of excellent granulation there is minimal slough/biofilm on the surface of the wound. She does have a significant amount of callus around the edges of the wound but at the same time I feel like that this is something we can easily pared down without any complication. Fortunately there is no evidence of active infection at this point. No fevers, chills, nausea, vomiting, or diarrhea. 04/22/2019 on evaluation today patient appears to be doing somewhat better in regard to her wound. She has been tolerating the dressing changes without complication. There is some callus noted at this point this can require some sharp  debridement which I discussed with the patient as well. We will go ahead and proceed with debridement today to try to clear away some of  this necrotic callus as well as clean off the biofilm/slough from the surface of the wound. 12/29-Patient returns at 1 week with regards to her left plantar foot wound which seems to be doing well, the callus was debrided around the wound the last time and seems to be doing much better since. Apparently it standing smaller, patient is a little discomforted by having to come every week to the clinic but she agrees to do that 05/06/19 on evaluation today patient actually appears to be doing well overall with regard to her plantar foot ulcer. She does have some callous buildup today but nonetheless this does not appear to be showing any signs of active infection at this time which is great news. The base of the wound does seem to be much healthier than what it was last time I saw her. No fevers, chills, nausea, or vomiting noted at this time. 05/13/2019 on evaluation today patient appears to be doing well with regard to her plantar foot ulcer. She has been tolerating the dressing changes without complication. In fact I am not even sure there is anything that is going require sharp debridement at this point today which is also good news. Melinda [name], Melinda EMarland Kitchen ([id number]) Fortunately there is no signs of active infection at this time. No fevers, chills, nausea, vomiting, or diarrhea. 05/19/2018 upon evaluation today patient actually appears to be doing excellent in regard to her wound on the plantar foot. She has been tolerating the dressing changes without complication. Fortunately there is no signs of active infection at this time which is good news. No fevers, chills, nausea, vomiting, or diarrhea. 05/27/2019 upon evaluation today patient appears to be doing excellent in regard to her foot ulcer. She has been tolerating the dressing changes without complication. Fortunately there is no evidence of active infection at this time which is good news. Overall she seems to be showing signs of excellent epithelization  which is also excellent news. 06/13/2019 upon evaluation today patient appears to be doing well with regard to her left plantar foot ulcer. She has been tolerating the dressing changes without complication. Fortunately there is no signs of active infection at this time. She does not seem to be having too much drainage at this point which is also excellent news. Overall very pleased with how things have progressed. She does have a lot of callus on the right great toe but this does not seem to be 80 whereas significant as what were dealing with on the left. In fact the toe actually appears to be still healed as far as I am aware. There is no signs of active infection at this time. She does want to see if I can pare away some of the callus which I think is definitely something I can do for her today. 06/25/2019 upon evaluation today patient appears to be doing excellent in regard to her plantar foot wound. She has been tolerating the dressing changes without complication. Fortunately there is no signs of active infection which is great news. Overall I do feel like she is getting very close to healing I do believe the collagen has been beneficial for her based on what I am seeing currently. She is extremely pleased to hear this and see how things are progressing. 07/03/2019 upon evaluation today patient appears to be doing very well with regard to  her wound. She continues to show signs of improvement and I am very pleased with the progress that she is made. There does not appear to be any signs of active infection at this time which is also great news. No fevers, chills, nausea, vomiting, or diarrhea. 07/10/19 upon evaluation today patient appears to be making excellent progress. She is measuring better and overall seems to be doing quite well. I'm very pleased in this regard. There's no evidence of active infection at this time which is great news. No fevers, chills, nausea, or vomiting noted at  this time. Objective Constitutional Well-nourished and well-hydrated in no acute distress. Vitals Time Taken: 10:15 AM, Height: 68 in, Weight: 300 lbs, BMI: 45.6, Temperature: 98.4 F, Pulse: 90 bpm, Respiratory Rate: 16 breaths/min, Blood Pressure: 126/46 mmHg. Respiratory normal breathing without difficulty. Psychiatric this patient is able to make decisions and demonstrates good insight into disease process. Alert and Oriented x 3. pleasant and cooperative. General Notes: Patient's wound bed upon inspection today did require some sharp agreement to remove callous around the edges the wound she tolerated this today without complication post debridement wound bed appears to be doing much better. Integumentary (Hair, Skin) Wound #1 status is Open. Original cause of wound was Gradually Appeared. The wound is located on the Left,Plantar Metatarsal head first. The wound measures 0.5cm length x 0.4cm width x 0.2cm depth; 0.157cm^2 area and 0.031cm^3 volume. There is Fat Layer (Subcutaneous Tissue) Exposed exposed. There is no tunneling or undermining noted. There is a medium amount of serosanguineous drainage noted. The wound margin is epibole. There is large (67-100%) pale granulation within the wound bed. There is no necrotic tissue within the wound bed. General Notes: callus around wound Assessment Melinda [name], Melinda [name] ([id number]) Active Problems ICD-10 Type 2 diabetes mellitus with foot ulcer Type 2 diabetes mellitus with diabetic peripheral angiopathy without gangrene Non-pressure chronic ulcer of other part of left foot with fat layer exposed Unspecified open wound, left lower leg, initial encounter Type 2 diabetes mellitus with diabetic polyneuropathy Corns and callosities Procedures Wound #1 Pre-procedure diagnosis of Wound #1 is a Diabetic Wound/Ulcer of the Lower Extremity located on the Left,Plantar Metatarsal head first .Severity of Tissue Pre Debridement is: Fat layer exposed.  There was a Excisional Skin/Subcutaneous Tissue Debridement with a total area of 0.2 sq cm performed by [name], Katilin Raynes [name], [name]. With the following instrument(s): Curette to remove Viable and Non-Viable tissue/material. Material removed includes Callus and Subcutaneous Tissue and after achieving pain control using Lidocaine. A time out was conducted at 10:58, prior to the start of the procedure. A Minimum amount of bleeding was controlled with Pressure. The procedure was tolerated well. Post Debridement Measurements: 0.5cm length x 0.4cm width x 0.2cm depth; 0.031cm^3 volume. Character of Wound/Ulcer Post Debridement is stable. Severity of Tissue Post Debridement is: Fat layer exposed. Post procedure Diagnosis Wound #1: Same as Pre-Procedure Plan Wound Cleansing: Wound #1 Left,Plantar Metatarsal head first: Cleanse wound with mild soap and water No tub bath. Anesthetic (add to Medication List): Wound #1 Left,Plantar Metatarsal head first: Topical Lidocaine 4% cream applied to wound bed prior to debridement (In Clinic Only). Primary Wound Dressing: Wound #1 Left,Plantar Metatarsal head first: Silver Collagen Secondary Dressing: Wound #1 Left,Plantar Metatarsal head first: Gauze and Kerlix/Conform Foam Dressing Change Frequency: Wound #1 Left,Plantar Metatarsal head first: Change dressing every other day. Follow-up Appointments: Return Appointment in 1 week. Edema Control: Wound #1 Left,Plantar Metatarsal head first: Elevate legs to the level of the heart  and pump ankles as often as possible Off-Loading: Wound #1 Left,Plantar Metatarsal head first: Open toe surgical shoe with peg assist. Additional Orders / Instructions: Stop Smoking Increase protein intake. I'm gonna suggest since the patient is doing so well currently that we continue with the current wound care measures she's in agreement with plan. If anything changes or worsens in the meantime shall contact the office let me  know. Otherwise we will likely continue with the collagen as long as she continues to make excellent improvement from week to week. Melinda [name], Melinda [name] ([id number]) Please see above for specific wound care orders. We will see patient for re-evaluation in 1 week(s) here in the clinic. If anything worsens or changes patient will contact our office for additional recommendations. Electronic Signature(s) Signed: 07/13/2019 5:27:24 PM By: [name] [name] Entered By: [name] on 07/13/2019 17:01:53 Melinda [name] ([id number]) -------------------------------------------------------------------------------- SuperBill Details Patient Name: Melinda [name] Date of Service: 07/10/2019 Medical Record Number: [id number] Patient Account Number: [id number] Date of Birth/Sex: 07-May-1955 (64 y.o. F) Treating RN: Army Melia Primary Care Provider: [name] Other Clinician: Referring Provider: [name] Treating Provider/Extender: [name], Brayleigh Rybacki Weeks in Treatment: 19 Diagnosis Coding ICD-10 Codes Code Description E11.621 Type 2 diabetes mellitus with foot ulcer E11.51 Type 2 diabetes mellitus with diabetic peripheral angiopathy without gangrene 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sed S81.802A Unspecified open wound, left lower leg, initial encounter E11.42 Type 2 diabetes mellitus with diabetic polyneuropathy L84 Corns and callosities Facility Procedures CPT4 Code: [id number] Description: [id number] - DEB SUBQ TISSUE 20 SQ CM/< Modifier: Quantity: 1 CPT4 Code: Description: ICD-10 Diagnosis Description 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sed Modifier: Quantity: Physician Procedures CPT4 Code: [id number] Description: [id number] - WC PHYS SUBQ TISS 20 SQ CM Modifier: Quantity: 1 CPT4 Code: Description: ICD-10 Diagnosis Description L97.522 Non-pressure chronic ulcer of other part of left foot with fat  layer exposed Modifier: Quantity: Electronic Signature(s) Signed: 07/11/2019 4:19:26 PM By: [name] [name] Entered By: [name] on 07/10/2019 23:57:47

## 2019-07-15 ENCOUNTER — Ambulatory Visit: Payer: Medicare HMO | Admitting: Gastroenterology

## 2019-07-17 ENCOUNTER — Encounter: Payer: Medicare HMO | Admitting: Physician Assistant

## 2019-07-17 ENCOUNTER — Other Ambulatory Visit: Payer: Self-pay

## 2019-07-17 DIAGNOSIS — E11621 Type 2 diabetes mellitus with foot ulcer: Secondary | ICD-10-CM | POA: Diagnosis not present

## 2019-07-17 NOTE — Progress Notes (Signed)
Melinda, [name] ([id number]) Visit Report for 07/17/2019 Arrival Information Details Patient Name: Melinda [name], Melinda [name] Date of Service: 07/17/2019 11:00 AM Medical Record Number: [id number] Patient Account Number: [id number] Date of Birth/Sex: 1956/03/24 (64 y.o. F) Treating RN: [name] Primary Care Martina Brodbeck: [name] Other Clinician: Referring Charlean Carneal: [name] Treating Tonni Mansour/Extender: [name], [name]: 20 Visit Information History Since Last Visit Added or deleted any medications: No Patient Arrived: Ambulatory Any new allergies or adverse reactions: No Arrival Time: 11:00 Had a fall or experienced change in No Accompanied By: self activities of daily living that may affect Transfer Assistance: None risk of falls: Patient Identification Verified: Yes Signs or symptoms of abuse/neglect since last visito No Secondary Verification Process Completed: Yes Hospitalized since last visit: No Patient Has Alerts: Yes Implantable device outside of the clinic excluding No Patient Alerts: Patient on Blood Thinner cellular tissue based products placed in the center Plavix, Aspirin 81mg  since last visit: ABI's L .88/R .71 Has Dressing in Place as Prescribed: Yes Pain Present Now: No Electronic Signature(s) Signed: 07/17/2019 3:52:07 PM By: Lorine Bears RCP, RRT, CHT Entered By: Lorine Bears on 07/17/2019 11:02:54 Melinda [name] ([id number]) -------------------------------------------------------------------------------- Encounter Discharge Information Details Patient Name: Melinda [name] Date of Service: 07/17/2019 11:00 AM Medical Record Number: [id number] Patient Account Number: [id number] Date of Birth/Sex: April 17, 1956 (64 y.o. F) Treating RN: [name] Primary Care Eulanda Dorion: [name] Other Clinician: Referring Diamantina Edinger: [name] Treating Reathel Turi/Extender: [name], [name]: 20 Encounter  Discharge Information Items Post Procedure Vitals Discharge Condition: Stable Temperature (F): 98.9 Ambulatory Status: Ambulatory Pulse (bpm): 91 Discharge Destination: Home Respiratory Rate (breaths/min): 16 Transportation: Private Auto Blood Pressure (mmHg): 122/62 Accompanied By: self Schedule Follow-up Appointment: Yes Clinical Summary of Care: Electronic Signature(s) Signed: 07/17/2019 4:33:07 PM By: [name] Entered By: [name] on 07/17/2019 11:23:54 Melinda [name] ([id number]) -------------------------------------------------------------------------------- Lower Extremity Assessment Details Patient Name: Melinda [name] Date of Service: 07/17/2019 11:00 AM Medical Record Number: [id number] Patient Account Number: [id number] Date of Birth/Sex: [birth year] (64 y.o. F) Treating RN: [name] Primary Care Oluwakemi Salsberry: [name] Other Clinician: Referring Abanoub Hanken: [name] Treating Faruq Rosenberger/Extender: [name], [name]: 20 Edema Assessment Assessed: [Left: No] [Right: No] Edema: [Left: N] [Right: o] Vascular Assessment Pulses: Dorsalis Pedis Palpable: [Left:Yes] Electronic Signature(s) Signed: 07/17/2019 4:33:07 PM By: [name] Entered By: [name] on 07/17/2019 11:15:07 Melinda [name] ([id number]) -------------------------------------------------------------------------------- Multi Wound Chart Details Patient Name: Melinda [name] Date of Service: 07/17/2019 11:00 AM Medical Record Number: [id number] Patient Account Number: [id number] Date of Birth/Sex: 01/26/1956 (64 y.o. F) Treating RN: [name] Primary Care Chriselda Leppert: [name] Other Clinician: Referring Ja Ohman: [name] Treating Harry Shuck/Extender: [name], [name]: 20 Vital Signs Height(in): 68 Pulse(bpm): 91 Weight(lbs): 300 Blood Pressure(mmHg): 122/62 Body Mass Index(BMI): 46 Temperature(F): 98.9 Respiratory  Rate(breaths/min): 16 Photos: [N/A:N/A] Wound Location: Left Metatarsal head first - Plantar N/A N/A Wounding Event: Gradually Appeared N/A N/A Primary Etiology: Diabetic Wound/Ulcer of the Lower N/A N/A Extremity Comorbid History: Cataracts, Coronary Artery Disease, N/A N/A Hypertension, Myocardial Infarction, Peripheral Venous Disease, Type II Diabetes, Osteoarthritis, Osteomyelitis, Neuropathy Date Acquired: 11/30/2018 N/A N/A Weeks of Treatment: 20 N/A N/A Wound Status: Open N/A N/A Measurements L x W x D (cm) 0.3x0.2x0.2 N/A N/A Area (cm) : 0.047 N/A N/A Volume (cm) : 0.009 N/A N/A % Reduction in Area: 98.50% N/A N/A % Reduction in Volume: 99.40% N/A N/A Classification: Grade 2 N/A N/A  Exudate Amount: Medium N/A N/A Exudate Type: Serosanguineous N/A N/A Exudate Color: red, brown N/A N/A Wound Margin: Epibole N/A N/A Granulation Amount: Large (67-100%) N/A N/A Granulation Quality: Pale N/A N/A Necrotic Amount: None Present (0%) N/A N/A Exposed Structures: Fat Layer (Subcutaneous Tissue) N/A N/A Exposed: Yes Fascia: No Tendon: No Muscle: No Joint: No Bone: No Epithelialization: Medium (34-66%) N/A N/A Treatment Notes Electronic Signature(s) STORME, FESMIRE ([id number]) Signed: 07/17/2019 4:33:07 PM By: [name] Entered By: [name] on 07/17/2019 11:20:54 Melinda [name] ([id number]) -------------------------------------------------------------------------------- Multi-Disciplinary Care Plan Details Patient Name: Melinda [name] Date of Service: 07/17/2019 11:00 AM Medical Record Number: [id number] Patient Account Number: [id number] Date of Birth/Sex: 1956-04-06 (64 y.o. F) Treating RN: [name] Primary Care Debany Vantol: [name] Other Clinician: Referring Solace Wendorff: [name] Treating [place] Kinyon/Extender: [name], [name]: 20 Active Inactive Medication Nursing Diagnoses: Knowledge deficit related to medication safety: actual  or potential Goals: Patient/caregiver will demonstrate understanding of all current medications Date Initiated: 02/26/2019 Target Resolution Date: 02/26/2019 Goal Status: Active Interventions: Assess for medication contraindications each visit where new medications are prescribed Notes: Necrotic Tissue Nursing Diagnoses: Impaired tissue integrity related to necrotic/devitalized tissue Goals: Necrotic/devitalized tissue will be minimized in the wound bed Date Initiated: 02/26/2019 Target Resolution Date: 03/06/2019 Goal Status: Active Interventions: Assess patient pain level pre-, during and post procedure and prior to discharge Treatment Activities: Apply topical anesthetic as ordered : 02/26/2019 Notes: Pressure Nursing Diagnoses: Knowledge deficit related to causes and risk factors for pressure ulcer development Knowledge deficit related to management of pressures ulcers Goals: Patient will remain free from development of additional pressure ulcers Date Initiated: 02/26/2019 Target Resolution Date: 02/26/2019 Goal Status: Active Patient will remain free of pressure ulcers Date Initiated: 02/26/2019 Target Resolution Date: 02/26/2019 Goal Status: Active Patient/caregiver will verbalize understanding of pressure ulcer management Date Initiated: 02/26/2019 Target Resolution Date: 02/26/2019 Goal Status: Active DONABELLE, PICQUET ([id number]) Interventions: Assess: immobility, friction, shearing, incontinence upon admission and as needed Notes: Wound/Skin Impairment Nursing Diagnoses: Impaired tissue integrity Goals: Ulcer/skin breakdown will have a volume reduction of 30% by week 4 Date Initiated: 02/26/2019 Target Resolution Date: 03/29/2019 Goal Status: Active Interventions: Provide education on ulcer and skin care Treatment Activities: Topical wound management initiated : 02/26/2019 Notes: Electronic Signature(s) Signed: 07/17/2019 4:33:07 PM By: [name] Entered By: [name] on 07/17/2019 11:20:35 Melinda [name] ([id number]) -------------------------------------------------------------------------------- Pain Assessment Details Patient Name: Melinda [name] Date of Service: 07/17/2019 11:00 AM Medical Record Number: [id number] Patient Account Number: [id number] Date of Birth/Sex: 1956/04/24 (64 y.o. F) Treating RN: [name] Primary Care Trevian Hayashida: [name] Other Clinician: Referring Nychelle Cassata: [name] Treating Yamen Castrogiovanni/Extender: [name], [name]: 20 Active Problems Location of Pain Severity and Description of Pain Patient Has Paino No Site Locations Pain Management and Medication Current Pain Management: Electronic Signature(s) Signed: 07/17/2019 4:33:07 PM By: [name] Entered By: [name] on 07/17/2019 11:09:45 Melinda [name] ([id number]) -------------------------------------------------------------------------------- Patient/Caregiver Education Details Patient Name: Melinda [name] Date of Service: 07/17/2019 11:00 AM Medical Record Number: [id number] Patient Account Number: [id number] Date of Birth/Gender: [birth year] (64 y.o. F) Treating RN: [name] Primary Care Physician: [name] Other Clinician: Referring Physician: [name] Treating Physician/Extender: Sharalyn Ink in Treatment: 20 Education Assessment Education Provided To: Patient Education Topics Provided Wound Debridement: Handouts: Wound Debridement Methods: Demonstration, Explain/Verbal Responses: State content correctly Wound/Skin Impairment: Handouts: Caring for Your Ulcer Methods: Demonstration, Explain/Verbal Responses: State content correctly Electronic Signature(s) Signed: 07/17/2019  4:33:07 PM By: [name] Entered By: [name] on 07/17/2019 11:23:09 Melinda [name]  ([id number]) -------------------------------------------------------------------------------- Wound Assessment Details Patient Name: Melinda [name] Date of Service: 07/17/2019 11:00 AM Medical Record Number: [id number] Patient Account Number: [id number] Date of Birth/Sex: 1955-07-17 (65 y.o. F) Treating RN: [name] Primary Care Thekla Colborn: [name] Other Clinician: Referring Zamyah Wiesman: [name] Treating Colter Magowan/Extender: [name], [name]: 20 Wound Status Wound Number: 1 Primary Diabetic Wound/Ulcer of the Lower Extremity Etiology: Wound Location: Left Metatarsal head first - Plantar Wound Open Wounding Event: Gradually Appeared Status: Date Acquired: 11/30/2018 Comorbid Cataracts, Coronary Artery Disease, Hypertension, Weeks Of Treatment: 20 History: Myocardial Infarction, Peripheral Venous Disease, Type II Clustered Wound: No Diabetes, Osteoarthritis, Osteomyelitis, Neuropathy Photos Wound Measurements Length: (cm) 0.3 Width: (cm) 0.2 Depth: (cm) 0.2 Area: (cm) 0.047 Volume: (cm) 0.009 % Reduction in Area: 98.5% % Reduction in Volume: 99.4% Epithelialization: Medium (34-66%) Tunneling: No Undermining: No Wound Description Classification: Grade 2 Wound Margin: Epibole Exudate Amount: Medium Exudate Type: Serosanguineous Exudate Color: red, brown Foul Odor After Cleansing: No Slough/Fibrino No Wound Bed Granulation Amount: Large (67-100%) Exposed Structure Granulation Quality: Pale Fascia Exposed: No Necrotic Amount: None Present (0%) Fat Layer (Subcutaneous Tissue) Exposed: Yes Tendon Exposed: No Muscle Exposed: No Joint Exposed: No Bone Exposed: No Treatment Notes Wound #1 (Left, Plantar Metatarsal head first) Notes prisma, Foam, conform to foot Electronic Signature(s) BRAYLA, RHOAD ([id number]) Signed: 07/17/2019 4:33:07 PM By: [name] Entered By: [name] on 07/17/2019 11:14:52 Melinda [name]  ([id number]) -------------------------------------------------------------------------------- Vitals Details Patient Name: Melinda [name] Date of Service: 07/17/2019 11:00 AM Medical Record Number: [id number] Patient Account Number: [id number] Date of Birth/Sex: [birth year] (64 y.o. F) Treating RN: [name] Primary Care Andora Krull: [name] Other Clinician: Referring Andres Escandon: [name] Treating Mahala Rommel/Extender: [name], [name]: 20 Vital Signs Time Taken: 11:00 Temperature (F): 98.9 Height (in): 68 Pulse (bpm): 91 Weight (lbs): 300 Respiratory Rate (breaths/min): 16 Body Mass Index (BMI): 45.6 Blood Pressure (mmHg): 122/62 Reference Range: 80 - 120 mg / dl Electronic Signature(s) Signed: 07/17/2019 3:52:07 PM By: Lorine Bears RCP, RRT, CHT Entered By: Lorine Bears on 07/17/2019 11:03:20

## 2019-07-17 NOTE — Progress Notes (Addendum)
ELLENE, MARKO ([id number]) Visit Report for 07/17/2019 Chief Complaint Document Details Patient Name: Melinda [name], Melinda [name] Date of Service: 07/17/2019 11:00 AM Medical Record Number: [id number] Patient Account Number: [id number] Date of Birth/Sex: January 16, 1956 (64 y.o. F) Treating RN: [name] Primary Care Provider: [name] Other Clinician: Referring Provider: [name] Treating Provider/Extender: [name], Gabriela Giannelli Weeks in Treatment: 20 Information Obtained from: Patient Chief Complaint Left great toe ulcer and left LE skin tear Electronic Signature(s) Signed: 07/17/2019 12:43:45 PM By: [name] [name] Entered By: [name] on 07/17/2019 12:43:44 Melinda [name] ([id number]) -------------------------------------------------------------------------------- Debridement Details Patient Name: Melinda [name] Date of Service: 07/17/2019 11:00 AM Medical Record Number: [id number] Patient Account Number: [id number] Date of Birth/Sex: 05-07-1955 (64 y.o. F) Treating RN: [name] Primary Care Provider: [name] Other Clinician: Referring Provider: [name] Treating Provider/Extender: [name], Nehemiah Mcfarren Weeks in Treatment: 20 Debridement Performed for Wound #1 Left,Plantar Metatarsal head first Assessment: Performed By: Physician [name], Charnise Lovan E., [name] Debridement Type: Debridement Severity of Tissue Pre Debridement: Fat layer exposed Level of Consciousness (Pre- Awake and Alert procedure): Pre-procedure Verification/Time Out Yes - 11:19 Taken: Start Time: 11:19 Pain Control: Lidocaine Total Area Debrided (L x W): 0.3 (cm) x 0.2 (cm) = 0.06 (cm) Tissue and other material debrided: Viable, Non-Viable, Callus, Slough, Subcutaneous, Biofilm, Slough Level: Skin/Subcutaneous Tissue Debridement Description: Excisional Instrument: Curette Bleeding: Minimum Hemostasis Achieved: Pressure End Time: 11:21 Response to Treatment: Procedure was tolerated well Level  of Consciousness (Post- Awake and Alert procedure): Post Debridement Measurements of Total Wound Length: (cm) 0.3 Width: (cm) 0.2 Depth: (cm) 0.2 Volume: (cm) 0.009 Character of Wound/Ulcer Post Debridement: Stable Severity of Tissue Post Debridement: Fat layer exposed Post Procedure Diagnosis Same as Pre-procedure Electronic Signature(s) Signed: 07/17/2019 4:33:07 PM By: [name] Signed: 07/17/2019 5:16:18 PM By: [name] [name] Entered By: [name] on 07/17/2019 11:22:04 Melinda [name] ([id number]) -------------------------------------------------------------------------------- HPI Details Patient Name: Melinda [name] Date of Service: 07/17/2019 11:00 AM Medical Record Number: [id number] Patient Account Number: [id number] Date of Birth/Sex: 1956/03/19 (64 y.o. F) Treating RN: [name] Primary Care Provider: [name] Other Clinician: Referring Provider: [name] Treating Provider/Extender: [name], Auguste Tebbetts Weeks in Treatment: 20 History of Present Illness HPI Description: ADMISSION 02/26/2019 Patient is a 64 year old type II diabetic on insulin with significant polyneuropathy. She has been followed by Dr. Sherren Mocha cline of podiatry for problems related to her feet dating back to the early part of 2019 as I can review in [place] link. 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 Subsequently she was seen by Dr. Lucky Cowboy of vascular surgery and had stents x2 placed in her left SFA as well as left SFA angioplasties on 05/20/2017. She was noted to have a wound on her left foot in October 2019. In August 2020 on 8/24 she underwent a right anterior tibial artery angioplasty a right tibioperoneal trunk angioplasty and a right SFA angioplasty. The patient states that she developed a left great toe wound in August which is at the base of her previous partial amputation in this area. She tells [name] that she has had a right  great toe wound since December 2019 and she has been using Santyl to both of these areas that she received from a fellow parishioner at her church. By enlarge she has been using Neosporin to these areas and not offloading them specifically Arterial studies on 9/22 showed an ABI on the right  of 0.71 with triphasic waveforms on the left at 0.88 with triphasic and biphasic waveforms. TBI's on the right and 0.44 and on the left at 1.05. 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 11//20; x-rays I did last week were both negative for osteomyelitis. She has a fairly large wound at the base of her left first toe and a small punched out area on the right first toe. We use silver alginate last week 03/13/2019 upon evaluation today patient appears to be doing okay with regard to her wounds at this point. She does have some callus buildup noted upon evaluation at this point. Fortunately there is no evidence of active infection which is also good news. I am going to have to perform some debridement to clear away some of the necrotic tissue today. 03/25/2019 on evaluation today patient appears to be doing well with regard to her foot ulcers. She has been tolerating the dressing changes without complication. Fortunately there is no signs of active infection at this time. Her left foot ulcer actually seems to be doing excellent no debridement even necessary today I am good have to perform some debridement on the right great toe. 04/08/19 on evaluation today patient actually appears to be doing well with regard to her wounds. In fact on the right this appears to be completely healed on the left this is measuring smaller although there still like callous around the edges of the wound. Fortunately there's no evidence of active infection at this time there is  some hyper granulation. 04/15/2019 on evaluation today patient actually appears to be doing well with regard to her toe ulcer. This seems to be showing signs of excellent granulation there is minimal slough/biofilm on the surface of the wound. She does have a significant amount of callus around the edges of the wound but at the same time I feel like that this is something we can easily pared down without any complication. Fortunately there is no evidence of active infection at this point. No fevers, chills, nausea, vomiting, or diarrhea. 04/22/2019 on evaluation today patient appears to be doing somewhat better in regard to her wound. She has been tolerating the dressing changes without complication. There is some callus noted at this point this can require some sharp debridement which I discussed with the patient as well. We will go ahead and proceed with debridement today to try to clear away some of this necrotic callus as well as clean off the biofilm/slough from the surface of the wound. 12/29-Patient returns at 1 week with regards to her left plantar foot wound which seems to be doing well, the callus was debrided around the wound the last time and seems to be doing much better since. Apparently it standing smaller, patient is a little discomforted by having to come every week to the clinic but she agrees to do that 05/06/19 on evaluation today patient actually appears to be doing well overall with regard to her plantar foot ulcer. She does have some callous buildup today but nonetheless this does not appear to be showing any signs of active infection at this time which is great news. The base of the wound does seem to be much healthier than what it was last time I saw her. No fevers, chills, nausea, or vomiting noted at this time. 05/13/2019 on evaluation today patient appears to be doing well with  regard to her plantar foot ulcer. She has been tolerating the dressing changes without  complication. In fact I am not even sure there is anything that is going require sharp debridement at this point today which is also good news. Fortunately there is no signs of active infection at this time. No fevers, chills, nausea, vomiting, or diarrhea. 05/19/2018 upon evaluation today patient actually appears to be doing excellent in regard to her wound on the plantar foot. She has been tolerating the dressing changes without complication. Fortunately there is no signs of active infection at this time which is good news. No fevers, chills, nausea, vomiting, or diarrhea. LETISHA, AUSMUS ([id number]) 05/27/2019 upon evaluation today patient appears to be doing excellent in regard to her foot ulcer. She has been tolerating the dressing changes without complication. Fortunately there is no evidence of active infection at this time which is good news. Overall she seems to be showing signs of excellent epithelization which is also excellent news. 06/13/2019 upon evaluation today patient appears to be doing well with regard to her left plantar foot ulcer. She has been tolerating the dressing changes without complication. Fortunately there is no signs of active infection at this time. She does not seem to be having too much drainage at this point which is also excellent news. Overall very pleased with how things have progressed. She does have a lot of callus on the right great toe but this does not seem to be 80 whereas significant as what were dealing with on the left. In fact the toe actually appears to be still healed as far as I am aware. There is no signs of active infection at this time. She does want to see if I can pare away some of the callus which I think is definitely something I can do for her today. 06/25/2019 upon evaluation today patient appears to be doing excellent in regard to her plantar foot wound. She has been tolerating the dressing changes without complication. Fortunately there is no  signs of active infection which is great news. Overall I do feel like she is getting very close to healing I do believe the collagen has been beneficial for her based on what I am seeing currently. She is extremely pleased to hear this and see how things are progressing. 07/03/2019 upon evaluation today patient appears to be doing very well with regard to her wound. She continues to show signs of improvement and I am very pleased with the progress that she is made. There does not appear to be any signs of active infection at this time which is also great news. No fevers, chills, nausea, vomiting, or diarrhea. 07/10/19 upon evaluation today patient appears to be making excellent progress. She is measuring better and overall seems to be doing quite well. I'm very pleased in this regard. There's no evidence of active infection at this time which is great news. No fevers, chills, nausea, or vomiting noted at this time. 07/17/2019 upon evaluation today patient appears to be doing excellent in regard to her foot ulcer. This is can require some sharp debridement today but in general she seems to be doing quite well. Electronic Signature(s) Signed: 07/17/2019 12:45:55 PM By: [name] [name] Entered By: [name] on 07/17/2019 12:45:54 Melinda [name] ([id number]) -------------------------------------------------------------------------------- Physical Exam Details Patient Name: Melinda [name] Date of Service: 07/17/2019 11:00 AM Medical Record Number: [id number] Patient Account Number: [id number] Date of Birth/Sex: 11-Mar-1956 (64 y.o. F) Treating RN: [name]  Primary Care Provider: [name] Other Clinician: Referring Provider: [name] Treating Provider/Extender: [name], Demetries Coia Weeks in Treatment: 29 Constitutional Well-nourished and well-hydrated in no acute distress. Respiratory normal breathing without difficulty. Psychiatric this patient is able to make decisions and  demonstrates good insight into disease process. Alert and Oriented x 3. pleasant and cooperative. Notes Patient's wound bed again did require sharp debridement to remove slough and necrotic debris from the surface of the wound including biofilm. There was a little bit of callus around the edges well this was also trimmed down. Overall she seems to be doing quite well and I think we are getting very close to complete resolution. This is definitely measuring smaller yet again this week. Electronic Signature(s) Signed: 07/17/2019 12:46:14 PM By: [name] [name] Entered By: [name] on 07/17/2019 [place], Steward ([id number]) -------------------------------------------------------------------------------- Physician Orders Details Patient Name: Melinda [name] Date of Service: 07/17/2019 11:00 AM Medical Record Number: [id number] Patient Account Number: [id number] Date of Birth/Sex: 08/31/1955 (64 y.o. F) Treating RN: [name] Primary Care Provider: [name] Other Clinician: Referring Provider: [name] Treating Provider/Extender: [name], Kennard Fildes Weeks in Treatment: 20 Verbal / Phone Orders: No Diagnosis Coding ICD-10 Coding Code Description E11.621 Type 2 diabetes mellitus with foot ulcer E11.51 Type 2 diabetes mellitus with diabetic peripheral angiopathy without gangrene 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sed S81.802A Unspecified open wound, left lower leg, initial encounter E11.42 Type 2 diabetes mellitus with diabetic polyneuropathy L84 Corns and callosities Wound Cleansing Wound #1 Left,Plantar Metatarsal head first o Cleanse wound with mild soap and water o No tub bath. Anesthetic (add to Medication List) Wound #1 Left,Plantar Metatarsal head first o Topical Lidocaine 4% cream applied to wound bed prior to debridement (In Clinic Only). Primary Wound Dressing Wound #1 Left,Plantar Metatarsal head  first o Silver Collagen Secondary Dressing Wound #1 Left,Plantar Metatarsal head first o Gauze and Kerlix/Conform o Foam Dressing Change Frequency Wound #1 Left,Plantar Metatarsal head first o Change dressing every other day. Follow-up Appointments o Return Appointment in 1 week. Edema Control Wound #1 Left,Plantar Metatarsal head first o Elevate legs to the level of the heart and pump ankles as often as possible Off-Loading Wound #1 Left,Plantar Metatarsal head first o Open toe surgical shoe with peg assist. Additional Orders / Instructions o Stop Smoking o Increase protein intake. Electronic Signature(s) Signed: 07/17/2019 4:33:07 PM By: Anabel Bene, Sonia Side ([id number]) Signed: 07/17/2019 5:16:18 PM By: [name] [name] Entered By: [name] on 07/17/2019 11:22:42 Melinda [name] ([id number]) -------------------------------------------------------------------------------- Problem List Details Patient Name: DAINE, SHEARON. Date of Service: 07/17/2019 11:00 AM Medical Record Number: [id number] Patient Account Number: [id number] Date of Birth/Sex: [birth year] (64 y.o. F) Treating RN: [name] Primary Care Provider: [name] Other Clinician: Referring Provider: [name] Treating Provider/Extender: [name], [name] Lysne Weeks in Treatment: 20 Active Problems ICD-10 Evaluated Encounter Code Description Active Date Today Diagnosis E11.621 Type 2 diabetes mellitus with foot ulcer 02/26/2019 No Yes E11.51 Type 2 diabetes mellitus with diabetic peripheral angiopathy without 02/26/2019 No Yes gangrene 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sed 02/26/2019 No Yes S81.802A Unspecified open wound, left lower leg, initial encounter 04/15/2019 No Yes E11.42 Type 2 diabetes mellitus with diabetic polyneuropathy 02/26/2019 No Yes L84 Corns and callosities 06/13/2019 No Yes Inactive Problems Resolved  Problems ICD-10 Code Description Active Date Resolved Date L97.512 Non-pressure chronic ulcer of other  part of right foot with fat layer exposed 02/26/2019 02/26/2019 Electronic Signature(s) Signed: 07/17/2019 11:18:36 AM By: [name] [name] Entered By: [name] on 07/17/2019 11:18:36 Melinda [name] ([id number]) -------------------------------------------------------------------------------- Progress Note Details Patient Name: Melinda [name] Date of Service: 07/17/2019 11:00 AM Medical Record Number: [id number] Patient Account Number: [id number] Date of Birth/Sex: Dec 16, 1955 (64 y.o. F) Treating RN: [name] Primary Care Provider: [name] Other Clinician: Referring Provider: [name] Treating Provider/Extender: [name], Shala Baumbach Weeks in Treatment: 20 Subjective Chief Complaint Information obtained from Patient Left great toe ulcer and left LE skin tear History of Present Illness (HPI) ADMISSION 02/26/2019 Patient is a 64 year old type II diabetic on insulin with significant polyneuropathy. She has been followed by Dr. Sherren Mocha cline of podiatry for problems related to her feet dating back to the early part of 2019 as I can review in [place] link. 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 Subsequently she was seen by Dr. Lucky Cowboy of vascular surgery and had stents x2 placed in her left SFA as well as left SFA angioplasties on 05/20/2017. She was noted to have a wound on her left foot in October 2019. In August 2020 on 8/24 she underwent a right anterior tibial artery angioplasty a right tibioperoneal trunk angioplasty and a right SFA angioplasty. The patient states that she developed a left great toe wound in August which is at the base of her previous partial amputation in this area. She tells [name] that she has had a right great toe wound since December 2019 and she has been using Santyl to both of these areas that she  received from a fellow parishioner at her church. By enlarge she has been using Neosporin to these areas and not offloading them specifically Arterial studies on 9/22 showed an ABI on the right of 0.71 with triphasic waveforms on the left at 0.88 with triphasic and biphasic waveforms. TBI's on the right and 0.44 and on the left at 1.05. 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 11//20; x-rays I did last week were both negative for osteomyelitis. She has a fairly large wound at the base of her left first toe and a small punched out area on the right first toe. We use silver alginate last week 03/13/2019 upon evaluation today patient appears to be doing okay with regard to her wounds at this point. She does have some callus buildup noted upon evaluation at this point. Fortunately there is no evidence of active infection which is also good news. I am going to have to perform some debridement to clear away some of the necrotic tissue today. 03/25/2019 on evaluation today patient appears to be doing well with regard to her foot ulcers. She has been tolerating the dressing changes without complication. Fortunately there is no signs of active infection at this time. Her left foot ulcer actually seems to be doing excellent no debridement even necessary today I am good have to perform some debridement on the right great toe. 04/08/19 on evaluation today patient actually appears to be doing well with regard to her wounds. In fact on the right this appears to be completely healed on the left this is measuring smaller although there still like callous around the edges of the wound. Fortunately there's no evidence of active infection at this time there  is some hyper granulation. 04/15/2019 on evaluation today patient actually appears to be doing well  with regard to her toe ulcer. This seems to be showing signs of excellent granulation there is minimal slough/biofilm on the surface of the wound. She does have a significant amount of callus around the edges of the wound but at the same time I feel like that this is something we can easily pared down without any complication. Fortunately there is no evidence of active infection at this point. No fevers, chills, nausea, vomiting, or diarrhea. 04/22/2019 on evaluation today patient appears to be doing somewhat better in regard to her wound. She has been tolerating the dressing changes without complication. There is some callus noted at this point this can require some sharp debridement which I discussed with the patient as well. We will go ahead and proceed with debridement today to try to clear away some of this necrotic callus as well as clean off the biofilm/slough from the surface of the wound. 12/29-Patient returns at 1 week with regards to her left plantar foot wound which seems to be doing well, the callus was debrided around the wound the last time and seems to be doing much better since. Apparently it standing smaller, patient is a little discomforted by having to come every week to the clinic but she agrees to do that 05/06/19 on evaluation today patient actually appears to be doing well overall with regard to her plantar foot ulcer. She does have some callous buildup today but nonetheless this does not appear to be showing any signs of active infection at this time which is great news. The base of the wound does seem to be much healthier than what it was last time I saw her. No fevers, chills, nausea, or vomiting noted at this time. 05/13/2019 on evaluation today patient appears to be doing well with regard to her plantar foot ulcer. She has been tolerating the dressing changes without complication. In fact I am not even sure there is anything that is going require sharp debridement at this  point today which is also good news. KAILEE, GARNO EMarland Kitchen ([id number]) Fortunately there is no signs of active infection at this time. No fevers, chills, nausea, vomiting, or diarrhea. 05/19/2018 upon evaluation today patient actually appears to be doing excellent in regard to her wound on the plantar foot. She has been tolerating the dressing changes without complication. Fortunately there is no signs of active infection at this time which is good news. No fevers, chills, nausea, vomiting, or diarrhea. 05/27/2019 upon evaluation today patient appears to be doing excellent in regard to her foot ulcer. She has been tolerating the dressing changes without complication. Fortunately there is no evidence of active infection at this time which is good news. Overall she seems to be showing signs of excellent epithelization which is also excellent news. 06/13/2019 upon evaluation today patient appears to be doing well with regard to her left plantar foot ulcer. She has been tolerating the dressing changes without complication. Fortunately there is no signs of active infection at this time. She does not seem to be having too much drainage at this point which is also excellent news. Overall very pleased with how things have progressed. She does have a lot of callus on the right great toe but this does not seem to be 80 whereas significant as what were dealing with on the left. In fact the toe actually appears to be still healed as far as I am aware.  There is no signs of active infection at this time. She does want to see if I can pare away some of the callus which I think is definitely something I can do for her today. 06/25/2019 upon evaluation today patient appears to be doing excellent in regard to her plantar foot wound. She has been tolerating the dressing changes without complication. Fortunately there is no signs of active infection which is great news. Overall I do feel like she is getting very close  to healing I do believe the collagen has been beneficial for her based on what I am seeing currently. She is extremely pleased to hear this and see how things are progressing. 07/03/2019 upon evaluation today patient appears to be doing very well with regard to her wound. She continues to show signs of improvement and I am very pleased with the progress that she is made. There does not appear to be any signs of active infection at this time which is also great news. No fevers, chills, nausea, vomiting, or diarrhea. 07/10/19 upon evaluation today patient appears to be making excellent progress. She is measuring better and overall seems to be doing quite well. I'm very pleased in this regard. There's no evidence of active infection at this time which is great news. No fevers, chills, nausea, or vomiting noted at this time. 07/17/2019 upon evaluation today patient appears to be doing excellent in regard to her foot ulcer. This is can require some sharp debridement today but in general she seems to be doing quite well. Objective Constitutional Well-nourished and well-hydrated in no acute distress. Vitals Time Taken: 11:00 AM, Height: 68 in, Weight: 300 lbs, BMI: 45.6, Temperature: 98.9 F, Pulse: 91 bpm, Respiratory Rate: 16 breaths/min, Blood Pressure: 122/62 mmHg. Respiratory normal breathing without difficulty. Psychiatric this patient is able to make decisions and demonstrates good insight into disease process. Alert and Oriented x 3. pleasant and cooperative. General Notes: Patient's wound bed again did require sharp debridement to remove slough and necrotic debris from the surface of the wound including biofilm. There was a little bit of callus around the edges well this was also trimmed down. Overall she seems to be doing quite well and I think we are getting very close to complete resolution. This is definitely measuring smaller yet again this week. Integumentary (Hair, Skin) Wound #1 status  is Open. Original cause of wound was Gradually Appeared. The wound is located on the Left,Plantar Metatarsal head first. The wound measures 0.3cm length x 0.2cm width x 0.2cm depth; 0.047cm^2 area and 0.009cm^3 volume. There is Fat Layer (Subcutaneous Tissue) Exposed exposed. There is no tunneling or undermining noted. There is a medium amount of serosanguineous drainage noted. The wound margin is epibole. There is large (67-100%) pale granulation within the wound bed. There is no necrotic tissue within the wound bed. BRENDALIS, VALENTINI ([id number]) Assessment Active Problems ICD-10 Type 2 diabetes mellitus with foot ulcer Type 2 diabetes mellitus with diabetic peripheral angiopathy without gangrene Non-pressure chronic ulcer of other part of left foot with fat layer exposed Unspecified open wound, left lower leg, initial encounter Type 2 diabetes mellitus with diabetic polyneuropathy Corns and callosities Procedures Wound #1 Pre-procedure diagnosis of Wound #1 is a Diabetic Wound/Ulcer of the Lower Extremity located on the Left,Plantar Metatarsal head first .Severity of Tissue Pre Debridement is: Fat layer exposed. There was a Excisional Skin/Subcutaneous Tissue Debridement with a total area of 0.06 sq cm performed by [name], Rylin Seavey E., [name]. With the following instrument(s):  Curette to remove Viable and Non-Viable tissue/material. Material removed includes Callus, Subcutaneous Tissue, Slough, and Biofilm after achieving pain control using Lidocaine. No specimens were taken. A time out was conducted at 11:19, prior to the start of the procedure. A Minimum amount of bleeding was controlled with Pressure. The procedure was tolerated well. Post Debridement Measurements: 0.3cm length x 0.2cm width x 0.2cm depth; 0.009cm^3 volume. Character of Wound/Ulcer Post Debridement is stable. Severity of Tissue Post Debridement is: Fat layer exposed. Post procedure Diagnosis Wound #1: Same as  Pre-Procedure Plan Wound Cleansing: Wound #1 Left,Plantar Metatarsal head first: Cleanse wound with mild soap and water No tub bath. Anesthetic (add to Medication List): Wound #1 Left,Plantar Metatarsal head first: Topical Lidocaine 4% cream applied to wound bed prior to debridement (In Clinic Only). Primary Wound Dressing: Wound #1 Left,Plantar Metatarsal head first: Silver Collagen Secondary Dressing: Wound #1 Left,Plantar Metatarsal head first: Gauze and Kerlix/Conform Foam Dressing Change Frequency: Wound #1 Left,Plantar Metatarsal head first: Change dressing every other day. Follow-up Appointments: Return Appointment in 1 week. Edema Control: Wound #1 Left,Plantar Metatarsal head first: Elevate legs to the level of the heart and pump ankles as often as possible Off-Loading: Wound #1 Left,Plantar Metatarsal head first: Open toe surgical shoe with peg assist. Additional Orders / Instructions: Stop Smoking Increase protein intake. 1. My suggestion is that we continue with the collagen dressing I think this is still the best way to go and overall I think she is doing quite well with this. 2. I am also can recommend at this time that we continue with the foam dressing followed by gauze and roll gauze to secure in place that seems to [facility] [place] ([id number]) have done well. 3. Also recommend she continue with the postop surgical shoe at this point. We will see patient back for reevaluation in 1 week here in the clinic. If anything worsens or changes patient will contact our office for additional recommendations. Electronic Signature(s) Signed: 07/17/2019 12:46:48 PM By: [name] [name] Entered By: [name] on 07/17/2019 12:46:48 Melinda [name] ([id number]) -------------------------------------------------------------------------------- SuperBill Details Patient Name: Melinda [name] Date of Service: 07/17/2019 Medical Record Number: [id number] Patient  Account Number: [id number] Date of Birth/Sex: 1955-09-11 (64 y.o. F) Treating RN: [name] Primary Care Provider: [name] Other Clinician: Referring Provider: [name] Treating Provider/Extender: [name], Garen Woolbright Weeks in Treatment: 20 Diagnosis Coding ICD-10 Codes Code Description E11.621 Type 2 diabetes mellitus with foot ulcer E11.51 Type 2 diabetes mellitus with diabetic peripheral angiopathy without gangrene 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sed S81.802A Unspecified open wound, left lower leg, initial encounter E11.42 Type 2 diabetes mellitus with diabetic polyneuropathy L84 Corns and callosities Facility Procedures CPT4 Code: [id number] Description: [id number] - DEB SUBQ TISSUE 20 SQ CM/< Modifier: Quantity: 1 CPT4 Code: Description: ICD-10 Diagnosis Description 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sed Modifier: Quantity: Physician Procedures CPT4 Code: [id number] Description: [id number] - WC PHYS SUBQ TISS 20 SQ CM Modifier: Quantity: 1 CPT4 Code: Description: ICD-10 Diagnosis Description 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sed Modifier: Quantity: Electronic Signature(s) Signed: 07/17/2019 12:46:57 PM By: [name] [name] Entered By: [name] on 07/17/2019 12:46:56

## 2019-07-22 ENCOUNTER — Other Ambulatory Visit: Payer: Self-pay

## 2019-07-22 ENCOUNTER — Ambulatory Visit (INDEPENDENT_AMBULATORY_CARE_PROVIDER_SITE_OTHER): Payer: Medicare HMO | Admitting: Podiatry

## 2019-07-22 DIAGNOSIS — Z794 Long term (current) use of insulin: Secondary | ICD-10-CM

## 2019-07-22 DIAGNOSIS — B351 Tinea unguium: Secondary | ICD-10-CM | POA: Diagnosis not present

## 2019-07-22 DIAGNOSIS — M2042 Other hammer toe(s) (acquired), left foot: Secondary | ICD-10-CM

## 2019-07-22 DIAGNOSIS — Z89429 Acquired absence of other toe(s), unspecified side: Secondary | ICD-10-CM

## 2019-07-22 DIAGNOSIS — E1159 Type 2 diabetes mellitus with other circulatory complications: Secondary | ICD-10-CM

## 2019-07-22 DIAGNOSIS — M79675 Pain in left toe(s): Secondary | ICD-10-CM | POA: Diagnosis not present

## 2019-07-22 DIAGNOSIS — M79674 Pain in right toe(s): Secondary | ICD-10-CM

## 2019-07-22 DIAGNOSIS — M2041 Other hammer toe(s) (acquired), right foot: Secondary | ICD-10-CM | POA: Diagnosis not present

## 2019-07-23 ENCOUNTER — Encounter: Payer: Self-pay | Admitting: Podiatry

## 2019-07-23 ENCOUNTER — Other Ambulatory Visit: Payer: Self-pay

## 2019-07-23 MED ORDER — POTASSIUM CHLORIDE CRYS ER 20 MEQ PO TBCR: EXTENDED_RELEASE_TABLET | ORAL | 3 refills | Status: DC

## 2019-07-23 NOTE — Progress Notes (Signed)
  Subjective:  Patient ID: Melinda [name], female    DOB: 09/21/1955,  MRN: [id number]  Chief Complaint  Patient presents with  . Diabetes    pt is here for diabetic foot care, pt is currently on plavix, pt also has an ulcer of the left big toe that she is getting treated for at the wound care center   64 y.o. female returns for the above complaint.  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  Patient states that they are painful to walk on.  They have been hard to debride herself as she is not able to get to them.  She also has a wound on the left submetatarsal 1 that is being actively treated by the wound care center.  She does not want me to treat the wound at this time as is being treated by another physician.  She denies any other acute complaints.  She states that she is due for new diabetic shoes as well.  Objective:  There were no vitals filed for this visit. Podiatric Exam: Vascular: dorsalis pedis and posterior tibial pulses are palpable bilateral. Capillary return is immediate. Temperature gradient is WNL. Skin turgor WNL  Sensorium: Normal Semmes Weinstein monofilament test. Normal tactile sensation bilaterally. Nail Exam: Pt has thick disfigured discolored nails with subungual debris noted bilateral entire nail hallux through fifth toenails Ulcer Exam: There is no evidence of ulcer or pre-ulcerative changes or infection.  Left submetatarsal 1 ulcer with fat layer exposed no clinical signs of infection noted.  Actively treated with wound care center will defer further management to them Orthopedic Exam: Muscle tone and strength are WNL. No limitations in general ROM. No crepitus or effusions noted. HAV  B/L.  Hammer toes 2-5  B/L.  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 Skin: No Porokeratosis. No infection or ulcers  Assessment &  Plan:  Patient was evaluated and treated and all questions answered.  Left submetatarsal 1 ulcer fat layer exposed -Appears to be granular with with regression.  It is actively being treated by the wound care center.  I will defer management to the wound care center.  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  Patient is scheduled for new pair of diabetic shoes as this will help offload the pressure points.  She will be scheduled to see rec for diabetic shoes.  Onychomycosis with pain  -Nails palliatively debrided as below. -Educated on self-care  Procedure: Nail Debridement Rationale: pain  Type of Debridement: manual, sharp debridement. Instrumentation: Nail nipper, rotary burr. Number of Nails: 6  Procedures and Treatment: Consent by patient was obtained for treatment procedures. The patient understood the discussion of treatment and procedures well. All questions were answered thoroughly reviewed. Debridement of mycotic and hypertrophic toenails, 1 through 5 bilateral and clearing of subungual debris. No ulceration, no infection noted.  Return Visit-Office Procedure: Patient instructed to return to the office for a follow up visit 3 months for continued evaluation and treatment.  Boneta Lucks, DPM    No follow-ups on file.

## 2019-07-24 ENCOUNTER — Encounter: Payer: Medicare HMO | Admitting: Physician Assistant

## 2019-07-24 ENCOUNTER — Other Ambulatory Visit: Payer: Self-pay

## 2019-07-24 DIAGNOSIS — E11621 Type 2 diabetes mellitus with foot ulcer: Secondary | ICD-10-CM | POA: Diagnosis not present

## 2019-07-24 NOTE — Progress Notes (Signed)
BRIEA, MULLINAX ([id number]) Visit Report for 07/24/2019 Arrival Information Details Patient Name: Melinda [name], Melinda [name] Date of Service: 07/24/2019 11:00 AM Medical Record Number: [id number] Patient Account Number: [id number] Date of Birth/Sex: 12-16-1955 (64 y.o. F) Treating RN: Army Melia Primary Care Kashonda Sarkisyan: [name] Other Clinician: Referring Lanier Millon: [name] Treating Boyd Buffalo/Extender: [name], [name]: 21 Visit Information History Since Last Visit Added or deleted any medications: No Patient Arrived: Ambulatory Any new allergies or adverse reactions: No Arrival Time: 11:02 Had a fall or experienced change in No Accompanied By: self activities of daily living that may affect Transfer Assistance: None risk of falls: Patient Identification Verified: Yes Signs or symptoms of abuse/neglect since last visito No Patient Has Alerts: Yes Hospitalized since last visit: No Patient Alerts: Patient on Blood Thinner Has Dressing in Place as Prescribed: Yes Plavix, Aspirin 81mg  Pain Present Now: No ABI's L .88/R .71 Electronic Signature(s) Signed: 07/24/2019 3:40:54 PM By: Army Melia Entered By: Army Melia on 07/24/2019 11:02:45 Melinda [name] ([id number]) -------------------------------------------------------------------------------- Encounter Discharge Information Details Patient Name: Melinda [name] Date of Service: 07/24/2019 11:00 AM Medical Record Number: [id number] Patient Account Number: [id number] Date of Birth/Sex: May 21, 1955 (64 y.o. F) Treating RN: Army Melia Primary Care Jase Reep: [name] Other Clinician: Referring Mishti Swanton: [name] Treating Abbye Lao/Extender: [name], [name]: 21 Encounter Discharge Information Items Post Procedure Vitals Discharge Condition: Stable Temperature (F): 98.6 Ambulatory Status: Ambulatory Pulse (bpm): 88 Discharge Destination: Home Respiratory Rate (breaths/min):  16 Transportation: Private Auto Blood Pressure (mmHg): 121/53 Accompanied By: self Schedule Follow-up Appointment: No Clinical Summary of Care: Electronic Signature(s) Signed: 07/24/2019 3:40:54 PM By: Army Melia Entered By: Army Melia on 07/24/2019 11:42:45 Melinda [name] ([id number]) -------------------------------------------------------------------------------- Lower Extremity Assessment Details Patient Name: Melinda [name] Date of Service: 07/24/2019 11:00 AM Medical Record Number: [id number] Patient Account Number: [id number] Date of Birth/Sex: 12-16-1955 (64 y.o. F) Treating RN: Army Melia Primary Care Juliene Kirsh: [name] Other Clinician: Referring Jake Goodson: [name] Treating Delanda Bulluck/Extender: [name], [name]: 21 Edema Assessment Assessed: [Left: No] [Right: No] Edema: [Left: N] [Right: o] Vascular Assessment Pulses: Dorsalis Pedis Palpable: [Left:Yes] Electronic Signature(s) Signed: 07/24/2019 3:40:54 PM By: Army Melia Entered By: Army Melia on 07/24/2019 11:06:34 Melinda [name] ([id number]) -------------------------------------------------------------------------------- Multi Wound Chart Details Patient Name: Melinda [name] Date of Service: 07/24/2019 11:00 AM Medical Record Number: [id number] Patient Account Number: [id number] Date of Birth/Sex: [birth year] (64 y.o. F) Treating RN: Army Melia Primary Care Markee Matera: [name] Other Clinician: Referring Carel Schnee: [name] Treating Raquon Milledge/Extender: [name], [name]: 21 Vital Signs Height(in): 68 Pulse(bpm): 88 Weight(lbs): 300 Blood Pressure(mmHg): 121/53 Body Mass Index(BMI): 46 Temperature(F): 98.6 Respiratory Rate(breaths/min): 16 Photos: [N/A:N/A] Wound Location: Left Metatarsal head first - Plantar N/A N/A Wounding Event: Gradually Appeared N/A N/A Primary Etiology: Diabetic Wound/Ulcer of the Lower N/A N/A Extremity Comorbid History:  Cataracts, Coronary Artery Disease, N/A N/A Hypertension, Myocardial Infarction, Peripheral Venous Disease, Type II Diabetes, Osteoarthritis, Osteomyelitis, Neuropathy Date Acquired: 11/30/2018 N/A N/A Weeks of Treatment: 21 N/A N/A Wound Status: Open N/A N/A Measurements L x W x D (cm) 0.5x0.4x0.2 N/A N/A Area (cm) : 0.157 N/A N/A Volume (cm) : 0.031 N/A N/A % Reduction in Area: 95.00% N/A N/A % Reduction in Volume: 98.00% N/A N/A Classification: Grade 2 N/A N/A Exudate Amount: Medium N/A N/A Exudate Type: Serosanguineous N/A N/A Exudate Color: red, brown N/A N/A Wound Margin: Epibole N/A N/A Granulation Amount: Large (67-100%) N/A N/A Granulation Quality:  Pale N/A N/A Necrotic Amount: None Present (0%) N/A N/A Exposed Structures: Fat Layer (Subcutaneous Tissue) N/A N/A Exposed: Yes Fascia: No Tendon: No Muscle: No Joint: No Bone: No Epithelialization: Medium (34-66%) N/A N/A Treatment Notes Electronic Signature(s) KIP, MCINERNY ([id number]) Signed: 07/24/2019 3:40:54 PM By: Army Melia Entered By: Army Melia on 07/24/2019 11:36:39 Melinda [name] ([id number]) -------------------------------------------------------------------------------- Corn Details Patient Name: Melinda [name] Date of Service: 07/24/2019 11:00 AM Medical Record Number: [id number] Patient Account Number: [id number] Date of Birth/Sex: 12-Oct-1955 (64 y.o. F) Treating RN: Army Melia Primary Care Dorine Duffey: [name] Other Clinician: Referring Hallie Ertl: [name] Treating Peggie Hornak/Extender: [name], [name]: 21 Active Inactive Medication Nursing Diagnoses: Knowledge deficit related to medication safety: actual or potential Goals: Patient/caregiver will demonstrate understanding of all current medications Date Initiated: 02/26/2019 Target Resolution Date: 02/26/2019 Goal Status: Active Interventions: Assess for medication contraindications each  visit where new medications are prescribed Notes: Necrotic Tissue Nursing Diagnoses: Impaired tissue integrity related to necrotic/devitalized tissue Goals: Necrotic/devitalized tissue will be minimized in the wound bed Date Initiated: 02/26/2019 Target Resolution Date: 03/06/2019 Goal Status: Active Interventions: Assess patient pain level pre-, during and post procedure and prior to discharge Treatment Activities: Apply topical anesthetic as ordered : 02/26/2019 Notes: Pressure Nursing Diagnoses: Knowledge deficit related to causes and risk factors for pressure ulcer development Knowledge deficit related to management of pressures ulcers Goals: Patient will remain free from development of additional pressure ulcers Date Initiated: 02/26/2019 Target Resolution Date: 02/26/2019 Goal Status: Active Patient will remain free of pressure ulcers Date Initiated: 02/26/2019 Target Resolution Date: 02/26/2019 Goal Status: Active Patient/caregiver will verbalize understanding of pressure ulcer management Date Initiated: 02/26/2019 Target Resolution Date: 02/26/2019 Goal Status: Active NINA, PEIKERT ([id number]) Interventions: Assess: immobility, friction, shearing, incontinence upon admission and as needed Notes: Wound/Skin Impairment Nursing Diagnoses: Impaired tissue integrity Goals: Ulcer/skin breakdown will have a volume reduction of 30% by week 4 Date Initiated: 02/26/2019 Target Resolution Date: 03/29/2019 Goal Status: Active Interventions: Provide education on ulcer and skin care Treatment Activities: Topical wound management initiated : 02/26/2019 Notes: Electronic Signature(s) Signed: 07/24/2019 3:40:54 PM By: Army Melia Entered By: Army Melia on 07/24/2019 11:36:31 Melinda [name] ([id number]) -------------------------------------------------------------------------------- Pain Assessment Details Patient Name: Melinda [name] Date of Service: 07/24/2019  11:00 AM Medical Record Number: [id number] Patient Account Number: [id number] Date of Birth/Sex: Sep 04, 1955 (64 y.o. F) Treating RN: Army Melia Primary Care Niquan Charnley: [name] Other Clinician: Referring Anaih Brander: [name] Treating Sahithi Ordoyne/Extender: [name], [name]: 21 Active Problems Location of Pain Severity and Description of Pain Patient Has Paino No Site Locations Pain Management and Medication Current Pain Management: Electronic Signature(s) Signed: 07/24/2019 3:40:54 PM By: Army Melia Entered By: Army Melia on 07/24/2019 [name], [place]. ([id number]) -------------------------------------------------------------------------------- Patient/Caregiver Education Details Patient Name: Melinda [name] Date of Service: 07/24/2019 11:00 AM Medical Record Number: [id number] Patient Account Number: [id number] Date of Birth/Gender: [birth year] (64 y.o. F) Treating RN: Army Melia Primary Care Physician: [name] Other Clinician: Referring Physician: [name] Treating Physician/Extender: Sharalyn Ink in Treatment: 21 Education Assessment Education Provided To: Patient Education Topics Provided Wound/Skin Impairment: Handouts: Caring for Your Ulcer Methods: Demonstration, Explain/Verbal Responses: State content correctly Electronic Signature(s) Signed: 07/24/2019 3:40:54 PM By: Army Melia Entered By: Army Melia on 07/24/2019 11:41:37 Melinda [name] ([id number]) -------------------------------------------------------------------------------- Wound Assessment Details Patient Name: Melinda [name] Date of Service: 07/24/2019 11:00 AM Medical Record Number: [id number] Patient Account Number: [id number] Date of  Birth/Sex: 25-Apr-1956 (64 y.o. F) Treating RN: Army Melia Primary Care Jezebelle Ledwell: [name] Other Clinician: Referring Kaysey Berndt: [name] Treating Ricke Kimoto/Extender: [name], [name]:  21 Wound Status Wound Number: 1 Primary Diabetic Wound/Ulcer of the Lower Extremity Etiology: Wound Location: Left Metatarsal head first - Plantar Wound Open Wounding Event: Gradually Appeared Status: Date Acquired: 11/30/2018 Comorbid Cataracts, Coronary Artery Disease, Hypertension, Weeks Of Treatment: 21 History: Myocardial Infarction, Peripheral Venous Disease, Type II Clustered Wound: No Diabetes, Osteoarthritis, Osteomyelitis, Neuropathy Photos Wound Measurements Length: (cm) 0.5 Width: (cm) 0.4 Depth: (cm) 0.2 Area: (cm) 0.157 Volume: (cm) 0.031 % Reduction in Area: 95% % Reduction in Volume: 98% Epithelialization: Medium (34-66%) Tunneling: No Undermining: No Wound Description Classification: Grade 2 Wound Margin: Epibole Exudate Amount: Medium Exudate Type: Serosanguineous Exudate Color: red, brown Foul Odor After Cleansing: No Slough/Fibrino No Wound Bed Granulation Amount: Large (67-100%) Exposed Structure Granulation Quality: Pale Fascia Exposed: No Necrotic Amount: None Present (0%) Fat Layer (Subcutaneous Tissue) Exposed: Yes Tendon Exposed: No Muscle Exposed: No Joint Exposed: No Bone Exposed: No Treatment Notes Wound #1 (Left, Plantar Metatarsal head first) Notes prisma, adaptic, Foam, conform to foot Electronic Signature(s) ESTERA, BLOMGREN ([id number]) Signed: 07/24/2019 3:40:54 PM By: Army Melia Entered By: Army Melia on 07/24/2019 Harris, Bedias. ([id number]) -------------------------------------------------------------------------------- Vitals Details Patient Name: Melinda [name] Date of Service: 07/24/2019 11:00 AM Medical Record Number: [id number] Patient Account Number: [id number] Date of Birth/Sex: June 11, 1955 (64 y.o. F) Treating RN: Army Melia Primary Care Elexus Barman: [name] Other Clinician: Referring Anaise Sterbenz: [name] Treating Damarea Merkel/Extender: [name], [name]: 21 Vital Signs Time  Taken: 11:02 Temperature (F): 98.6 Height (in): 68 Pulse (bpm): 88 Weight (lbs): 300 Respiratory Rate (breaths/min): 16 Body Mass Index (BMI): 45.6 Blood Pressure (mmHg): 121/53 Reference Range: 80 - 120 mg / dl Electronic Signature(s) Signed: 07/24/2019 3:40:54 PM By: Army Melia Entered By: Army Melia on 07/24/2019 11:03:56

## 2019-07-24 NOTE — Progress Notes (Addendum)
SHAWNTRICE, BUDMAN ([id number]) Visit Report for 07/24/2019 Chief Complaint Document Details Patient Name: Melinda [name], Melinda [name] Date of Service: 07/24/2019 11:00 AM Medical Record Number: [id number] Patient Account Number: [id number] Date of Birth/Sex: [birth year] (64 y.o. F) Treating RN: Army Melia Primary Care Provider: [name] Other Clinician: Referring Provider: [name] Treating Provider/Extender: [name], Hildreth Orsak Weeks in Treatment: 21 Information Obtained from: Patient Chief Complaint Left great toe ulcer and left LE skin tear Electronic Signature(s) Signed: 07/24/2019 11:34:47 AM By: [name] [name] Entered By: [name] on 07/24/2019 11:34:47 Melinda [name] ([id number]) -------------------------------------------------------------------------------- Debridement Details Patient Name: Melinda [name] Date of Service: 07/24/2019 11:00 AM Medical Record Number: [id number] Patient Account Number: [id number] Date of Birth/Sex: Mar 18, 1956 (64 y.o. F) Treating RN: Army Melia Primary Care Provider: [name] Other Clinician: Referring Provider: [name] Treating Provider/Extender: [name], Ariatna Jester Weeks in Treatment: 21 Debridement Performed for Wound #1 Left,Plantar Metatarsal head first Assessment: Performed By: Physician [name], Payge Eppes E., [name] Debridement Type: Debridement Severity of Tissue Pre Debridement: Fat layer exposed Level of Consciousness (Pre- Awake and Alert procedure): Pre-procedure Verification/Time Out Yes - 11:38 Taken: Start Time: 11:39 Pain Control: Lidocaine Total Area Debrided (L x W): 0.5 (cm) x 0.4 (cm) = 0.2 (cm) Tissue and other material debrided: Viable, Non-Viable, Callus Level: Non-Viable Tissue Debridement Description: Selective/Open Wound Instrument: Curette Bleeding: None End Time: 11:40 Response to Treatment: Procedure was tolerated well Level of Consciousness (Post- Awake and Alert procedure): Post Debridement  Measurements of Total Wound Length: (cm) 0.5 Width: (cm) 0.4 Depth: (cm) 0.2 Volume: (cm) 0.031 Character of Wound/Ulcer Post Debridement: Stable Severity of Tissue Post Debridement: Fat layer exposed Post Procedure Diagnosis Same as Pre-procedure Electronic Signature(s) Signed: 07/24/2019 3:40:54 PM By: Army Melia Signed: 07/24/2019 4:59:35 PM By: [name] [name] Entered By: Army Melia on 07/24/2019 11:38:40 Melinda [name] ([id number]) -------------------------------------------------------------------------------- HPI Details Patient Name: Melinda [name] Date of Service: 07/24/2019 11:00 AM Medical Record Number: [id number] Patient Account Number: [id number] Date of Birth/Sex: Mar 26, 1956 (64 y.o. 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 She has been followed by Dr. Sherren Mocha cline of podiatry for problems related to her feet dating back to the early part of 2019 as I can review in [place] link. 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 Subsequently she was seen by Dr. Lucky Cowboy of vascular surgery and had stents x2 placed in her left SFA as well as left SFA angioplasties on 05/20/2017. She was noted to have a wound on her left foot in October 2019. In August 2020 on 8/24 she underwent a right anterior tibial artery angioplasty a right tibioperoneal trunk angioplasty and a right SFA angioplasty. The patient states that she developed a left great toe wound in August which is at the base of her previous partial amputation in this area. She tells [name] that she has had a right great toe wound since December 2019 and she has been using Santyl to both of  these areas that she received from a fellow parishioner at her church. By enlarge she has been using Neosporin to these areas and not offloading them specifically Arterial studies on 9/22 showed an ABI on the right of 0.71 with triphasic waveforms on  the left at 0.88 with triphasic and biphasic waveforms. TBI's on the right and 0.44 and on the left at 1.05. 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 11//20; x-rays I did last week were both negative for osteomyelitis. She has a fairly large wound at the base of her left first toe and a small punched out area on the right first toe. We use silver alginate last week 03/13/2019 upon evaluation today patient appears to be doing okay with regard to her wounds at this point. She does have some callus buildup noted upon evaluation at this point. Fortunately there is no evidence of active infection which is also good news. I am going to have to perform some debridement to clear away some of the necrotic tissue today. 03/25/2019 on evaluation today patient appears to be doing well with regard to her foot ulcers. She has been tolerating the dressing changes without complication. Fortunately there is no signs of active infection at this time. Her left foot ulcer actually seems to be doing excellent no debridement even necessary today I am good have to perform some debridement on the right great toe. 04/08/19 on evaluation today patient actually appears to be doing well with regard to her wounds. In fact on the right this appears to be completely healed on the left this is measuring smaller although there still like callous around the edges of the wound. Fortunately there's no evidence of active infection at this time there is some hyper granulation. 04/15/2019 on evaluation today patient actually  appears to be doing well with regard to her toe ulcer. This seems to be showing signs of excellent granulation there is minimal slough/biofilm on the surface of the wound. She does have a significant amount of callus around the edges of the wound but at the same time I feel like that this is something we can easily pared down without any complication. Fortunately there is no evidence of active infection at this point. No fevers, chills, nausea, vomiting, or diarrhea. 04/22/2019 on evaluation today patient appears to be doing somewhat better in regard to her wound. She has been tolerating the dressing changes without complication. There is some callus noted at this point this can require some sharp debridement which I discussed with the patient as well. We will go ahead and proceed with debridement today to try to clear away some of this necrotic callus as well as clean off the biofilm/slough from the surface of the wound. 12/29-Patient returns at 1 week with regards to her left plantar foot wound which seems to be doing well, the callus was debrided around the wound the last time and seems to be doing much better since. Apparently it standing smaller, patient is a little discomforted by having to come every week to the clinic but she agrees to do that 05/06/19 on evaluation today patient actually appears to be doing well overall with regard to her plantar foot ulcer. She does have some callous buildup today but nonetheless this does not appear to be showing any signs of active infection at this time which is great news. The base of the wound does seem to be much healthier than what it was last time I saw her. No fevers, chills, nausea, or vomiting noted at this time. 05/13/2019 on evaluation today patient appears to be doing well with regard to her plantar foot ulcer.  She has been tolerating the dressing changes without complication. In fact I am not even sure there is anything that is going require  sharp debridement at this point today which is also good news. Fortunately there is no signs of active infection at this time. No fevers, chills, nausea, vomiting, or diarrhea. 05/19/2018 upon evaluation today patient actually appears to be doing excellent in regard to her wound on the plantar foot. She has been tolerating the dressing changes without complication. Fortunately there is no signs of active infection at this time which is good news. No fevers, chills, nausea, vomiting, or diarrhea. TICEY, COTTLE ([id number]) 05/27/2019 upon evaluation today patient appears to be doing excellent in regard to her foot ulcer. She has been tolerating the dressing changes without complication. Fortunately there is no evidence of active infection at this time which is good news. Overall she seems to be showing signs of excellent epithelization which is also excellent news. 06/13/2019 upon evaluation today patient appears to be doing well with regard to her left plantar foot ulcer. She has been tolerating the dressing changes without complication. Fortunately there is no signs of active infection at this time. She does not seem to be having too much drainage at this point which is also excellent news. Overall very pleased with how things have progressed. She does have a lot of callus on the right great toe but this does not seem to be 80 whereas significant as what were dealing with on the left. In fact the toe actually appears to be still healed as far as I am aware. There is no signs of active infection at this time. She does want to see if I can pare away some of the callus which I think is definitely something I can do for her today. 06/25/2019 upon evaluation today patient appears to be doing excellent in regard to her plantar foot wound. She has been tolerating the dressing changes without complication. Fortunately there is no signs of active infection which is great news. Overall I do feel like she is  getting very close to healing I do believe the collagen has been beneficial for her based on what I am seeing currently. She is extremely pleased to hear this and see how things are progressing. 07/03/2019 upon evaluation today patient appears to be doing very well with regard to her wound. She continues to show signs of improvement and I am very pleased with the progress that she is made. There does not appear to be any signs of active infection at this time which is also great news. No fevers, chills, nausea, vomiting, or diarrhea. 07/10/19 upon evaluation today patient appears to be making excellent progress. She is measuring better and overall seems to be doing quite well. I'm very pleased in this regard. There's no evidence of active infection at this time which is great news. No fevers, chills, nausea, or vomiting noted at this time. 07/17/2019 upon evaluation today patient appears to be doing excellent in regard to her foot ulcer. This is can require some sharp debridement today but in general she seems to be doing quite well. 07/24/2019 upon evaluation today patient appears to be doing okay with regard to her foot ulcer. The wound does appear to be somewhat dry at this point however which is the one thing that is new not as good that I see currently. Fortunately there is no evidence of active infection at this time. No fevers, chills, nausea, vomiting, or diarrhea. Electronic Signature(s)  Signed: 07/24/2019 2:01:38 PM By: [name] [name] Entered By: [name] on 07/24/2019 14:01:38 Melinda [name] ([id number]) -------------------------------------------------------------------------------- Physical Exam Details Patient Name: RONNETTA, FAUNCE. Date of Service: 07/24/2019 11:00 AM Medical Record Number: [id number] Patient Account Number: [id number] Date of Birth/Sex: Feb 24, 1956 (64 y.o. F) Treating RN: Army Melia Primary Care Provider: [name] Other Clinician: Referring  Provider: [name] Treating Provider/Extender: [name], Emre Stock Weeks in Treatment: 21 Constitutional Obese and well-hydrated in no acute distress. Respiratory normal breathing without difficulty. Psychiatric this patient is able to make decisions and demonstrates good insight into disease process. Alert and Oriented x 3. pleasant and cooperative. Notes Patient's wound bed upon inspection showed signs of again good granulation there was some callus around the edges of the wound did require sharp debridement today. That was performed without complication post debridement the wound bed appears to be doing much better. Electronic Signature(s) Signed: 07/24/2019 2:01:54 PM By: [name] [name] Entered By: [name] on 07/24/2019 14:01:54 Melinda [name] ([id number]) -------------------------------------------------------------------------------- Physician Orders Details Patient Name: Melinda [name] Date of Service: 07/24/2019 11:00 AM Medical Record Number: [id number] Patient Account Number: [id number] Date of Birth/Sex: 12-11-1955 (64 y.o. F) Treating RN: Army Melia Primary Care Provider: [name] Other Clinician: Referring Provider: [name] Treating Provider/Extender: [name], Lucilia Yanni Weeks in Treatment: 21 Verbal / Phone Orders: No Diagnosis Coding ICD-10 Coding Code Description E11.621 Type 2 diabetes mellitus with foot ulcer E11.51 Type 2 diabetes mellitus with diabetic peripheral angiopathy without gangrene 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sed S81.802A Unspecified open wound, left lower leg, initial encounter E11.42 Type 2 diabetes mellitus with diabetic polyneuropathy L84 Corns and callosities Wound Cleansing Wound #1 Left,Plantar Metatarsal head first o Cleanse wound with mild soap and water o No tub bath. Anesthetic (add to Medication List) Wound #1 Left,Plantar Metatarsal head first o Topical  Lidocaine 4% cream applied to wound bed prior to debridement (In Clinic Only). Primary Wound Dressing Wound #1 Left,Plantar Metatarsal head first o Silver Collagen - moisten with KY jelly, hydrogel in office o Other: - Adaptic on top of prisma Secondary Dressing Wound #1 Left,Plantar Metatarsal head first o Gauze and Kerlix/Conform o Foam Dressing Change Frequency Wound #1 Left,Plantar Metatarsal head first o Change dressing every other day. Follow-up Appointments o Return Appointment in 1 week. Edema Control Wound #1 Left,Plantar Metatarsal head first o Elevate legs to the level of the heart and pump ankles as often as possible Off-Loading Wound #1 Left,Plantar Metatarsal head first o Open toe surgical shoe with peg assist. Additional Orders / Instructions o Stop Smoking o Increase protein intake. Electronic Signature(s) HISAE, COUGILL ([id number]) Signed: 07/24/2019 3:40:54 PM By: Army Melia Signed: 07/24/2019 4:59:35 PM By: [name] [name] Entered By: Army Melia on 07/24/2019 11:41:10 Melinda [name] ([id number]) -------------------------------------------------------------------------------- Problem List Details Patient Name: NIKEIA, FLANSBURG. Date of Service: 07/24/2019 11:00 AM Medical Record Number: [id number] Patient Account Number: [id number] Date of Birth/Sex: 1955-06-17 (64 y.o. F) Treating RN: Army Melia Primary Care Provider: [name] Other Clinician: Referring Provider: [name] Treating Provider/Extender: [name], Eugene Isadore Weeks in Treatment: 21 Active Problems ICD-10 Evaluated Encounter Code Description Active Date Today Diagnosis E11.621 Type 2 diabetes mellitus with foot ulcer 02/26/2019 No Yes E11.51 Type 2 diabetes mellitus with diabetic peripheral angiopathy without 02/26/2019 No Yes gangrene 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sed 02/26/2019 No Yes S81.802A  Unspecified  open wound, left lower leg, initial encounter 04/15/2019 No Yes E11.42 Type 2 diabetes mellitus with diabetic polyneuropathy 02/26/2019 No Yes L84 Corns and callosities 06/13/2019 No Yes Inactive Problems Resolved Problems ICD-10 Code Description Active Date Resolved Date L97.512 Non-pressure chronic ulcer of other part of right foot with fat layer exposed 02/26/2019 02/26/2019 Electronic Signature(s) Signed: 07/24/2019 11:34:41 AM By: [name] [name] Entered By: [name] on 07/24/2019 11:34:40 Melinda [name] ([id number]) -------------------------------------------------------------------------------- Progress Note Details Patient Name: Melinda [name] Date of Service: 07/24/2019 11:00 AM Medical Record Number: [id number] Patient Account Number: [id number] Date of Birth/Sex: [birth year] (64 y.o. 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 She has been followed by Dr. Sherren Mocha cline of podiatry for problems related to her feet dating back to the early part of 2019 as I can review in [place] link. 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 Subsequently she was seen by Dr. Lucky Cowboy of vascular surgery and had stents x2 placed in her left SFA as well as left SFA angioplasties on 05/20/2017. She was noted to have a wound on her left foot in October 2019. In August 2020 on 8/24 she underwent a right anterior tibial artery angioplasty a right tibioperoneal trunk angioplasty and a right SFA angioplasty. The patient states that she developed a left great toe wound in  August which is at the base of her previous partial amputation in this area. She tells [name] that she has had a right great toe wound since December 2019 and she has been using Santyl to both of these areas that she received from a fellow parishioner at her church. By enlarge she has been using Neosporin to these areas and not offloading them specifically Arterial studies on 9/22 showed an ABI on the right of 0.71 with triphasic waveforms on the left at 0.88 with triphasic and biphasic waveforms. TBI's on the right and 0.44 and on the left at 1.05. 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 11//20; x-rays I did last week were both negative for osteomyelitis. She has a fairly large wound at the base of her left first toe and a small punched out area on the right first toe. We use silver alginate last week 03/13/2019 upon evaluation today patient appears to be doing okay with regard to her wounds at this point. She does have some callus buildup noted upon evaluation at this point. Fortunately there is no evidence of active infection which is also good news. I am going to have to perform some debridement to clear away some of the necrotic tissue today. 03/25/2019 on evaluation today patient appears to be doing well with regard to her foot ulcers. She has been tolerating the dressing changes without complication. Fortunately there is no signs of active infection at this time. Her left foot ulcer actually seems to be doing excellent no debridement even necessary today I am good have to perform some debridement on the right great toe. 04/08/19 on evaluation today patient actually appears to be  doing well with regard to her wounds. In fact on the right this appears to be completely healed on the left this is measuring smaller although there still  like callous around the edges of the wound. Fortunately there's no evidence of active infection at this time there is some hyper granulation. 04/15/2019 on evaluation today patient actually appears to be doing well with regard to her toe ulcer. This seems to be showing signs of excellent granulation there is minimal slough/biofilm on the surface of the wound. She does have a significant amount of callus around the edges of the wound but at the same time I feel like that this is something we can easily pared down without any complication. Fortunately there is no evidence of active infection at this point. No fevers, chills, nausea, vomiting, or diarrhea. 04/22/2019 on evaluation today patient appears to be doing somewhat better in regard to her wound. She has been tolerating the dressing changes without complication. There is some callus noted at this point this can require some sharp debridement which I discussed with the patient as well. We will go ahead and proceed with debridement today to try to clear away some of this necrotic callus as well as clean off the biofilm/slough from the surface of the wound. 12/29-Patient returns at 1 week with regards to her left plantar foot wound which seems to be doing well, the callus was debrided around the wound the last time and seems to be doing much better since. Apparently it standing smaller, patient is a little discomforted by having to come every week to the clinic but she agrees to do that 05/06/19 on evaluation today patient actually appears to be doing well overall with regard to her plantar foot ulcer. She does have some callous buildup today but nonetheless this does not appear to be showing any signs of active infection at this time which is great news. The base of the wound does seem to be much healthier than what it was last time I saw her. No fevers, chills, nausea, or vomiting noted at this time. 05/13/2019 on evaluation today patient appears to  be doing well with regard to her plantar foot ulcer. She has been tolerating the dressing changes without complication. In fact I am not even sure there is anything that is going require sharp debridement at this point today which is also good news. JOZLYN, RUETER EMarland Kitchen ([id number]) Fortunately there is no signs of active infection at this time. No fevers, chills, nausea, vomiting, or diarrhea. 05/19/2018 upon evaluation today patient actually appears to be doing excellent in regard to her wound on the plantar foot. She has been tolerating the dressing changes without complication. Fortunately there is no signs of active infection at this time which is good news. No fevers, chills, nausea, vomiting, or diarrhea. 05/27/2019 upon evaluation today patient appears to be doing excellent in regard to her foot ulcer. She has been tolerating the dressing changes without complication. Fortunately there is no evidence of active infection at this time which is good news. Overall she seems to be showing signs of excellent epithelization which is also excellent news. 06/13/2019 upon evaluation today patient appears to be doing well with regard to her left plantar foot ulcer. She has been tolerating the dressing changes without complication. Fortunately there is no signs of active infection at this time. She does not seem to be having too much drainage at this point which is also excellent news. Overall very pleased with how things have  progressed. She does have a lot of callus on the right great toe but this does not seem to be 80 whereas significant as what were dealing with on the left. In fact the toe actually appears to be still healed as far as I am aware. There is no signs of active infection at this time. She does want to see if I can pare away some of the callus which I think is definitely something I can do for her today. 06/25/2019 upon evaluation today patient appears to be doing excellent in regard to her  plantar foot wound. She has been tolerating the dressing changes without complication. Fortunately there is no signs of active infection which is great news. Overall I do feel like she is getting very close to healing I do believe the collagen has been beneficial for her based on what I am seeing currently. She is extremely pleased to hear this and see how things are progressing. 07/03/2019 upon evaluation today patient appears to be doing very well with regard to her wound. She continues to show signs of improvement and I am very pleased with the progress that she is made. There does not appear to be any signs of active infection at this time which is also great news. No fevers, chills, nausea, vomiting, or diarrhea. 07/10/19 upon evaluation today patient appears to be making excellent progress. She is measuring better and overall seems to be doing quite well. I'm very pleased in this regard. There's no evidence of active infection at this time which is great news. No fevers, chills, nausea, or vomiting noted at this time. 07/17/2019 upon evaluation today patient appears to be doing excellent in regard to her foot ulcer. This is can require some sharp debridement today but in general she seems to be doing quite well. 07/24/2019 upon evaluation today patient appears to be doing okay with regard to her foot ulcer. The wound does appear to be somewhat dry at this point however which is the one thing that is new not as good that I see currently. Fortunately there is no evidence of active infection at this time. No fevers, chills, nausea, vomiting, or diarrhea. Objective Constitutional Obese and well-hydrated in no acute distress. Vitals Time Taken: 11:02 AM, Height: 68 in, Weight: 300 lbs, BMI: 45.6, Temperature: 98.6 F, Pulse: 88 bpm, Respiratory Rate: 16 breaths/min, Blood Pressure: 121/53 mmHg. Respiratory normal breathing without difficulty. Psychiatric this patient is able to make decisions and  demonstrates good insight into disease process. Alert and Oriented x 3. pleasant and cooperative. General Notes: Patient's wound bed upon inspection showed signs of again good granulation there was some callus around the edges of the wound did require sharp debridement today. That was performed without complication post debridement the wound bed appears to be doing much better. Integumentary (Hair, Skin) Wound #1 status is Open. Original cause of wound was Gradually Appeared. The wound is located on the Left,Plantar Metatarsal head first. The wound measures 0.5cm length x 0.4cm width x 0.2cm depth; 0.157cm^2 area and 0.031cm^3 volume. There is Fat Layer (Subcutaneous Tissue) Exposed exposed. There is no tunneling or undermining noted. There is a medium amount of serosanguineous drainage noted. The wound margin is epibole. There is large (67-100%) pale granulation within the wound bed. There is no necrotic tissue within the wound bed. MARISUE, SAINTLOUIS ([id number]) Assessment Active Problems ICD-10 Type 2 diabetes mellitus with foot ulcer Type 2 diabetes mellitus with diabetic peripheral angiopathy without gangrene Non-pressure chronic ulcer of other  part of left foot with fat layer exposed Unspecified open wound, left lower leg, initial encounter Type 2 diabetes mellitus with diabetic polyneuropathy Corns and callosities Procedures Wound #1 Pre-procedure diagnosis of Wound #1 is a Diabetic Wound/Ulcer of the Lower Extremity located on the Left,Plantar Metatarsal head first .Severity of Tissue Pre Debridement is: Fat layer exposed. There was a Selective/Open Wound Non-Viable Tissue Debridement with a total area of 0.2 sq cm performed by [name], Tishara Pizano E., [name]. With the following instrument(s): Curette to remove Viable and Non-Viable tissue/material. Material removed includes Callus after achieving pain control using Lidocaine. A time out was conducted at 11:38, prior to the start of the  procedure. There was no bleeding. The procedure was tolerated well. Post Debridement Measurements: 0.5cm length x 0.4cm width x 0.2cm depth; 0.031cm^3 volume. Character of Wound/Ulcer Post Debridement is stable. Severity of Tissue Post Debridement is: Fat layer exposed. Post procedure Diagnosis Wound #1: Same as Pre-Procedure Plan Wound Cleansing: Wound #1 Left,Plantar Metatarsal head first: Cleanse wound with mild soap and water No tub bath. Anesthetic (add to Medication List): Wound #1 Left,Plantar Metatarsal head first: Topical Lidocaine 4% cream applied to wound bed prior to debridement (In Clinic Only). Primary Wound Dressing: Wound #1 Left,Plantar Metatarsal head first: Silver Collagen - moisten with KY jelly, hydrogel in office Other: - Adaptic on top of prisma Secondary Dressing: Wound #1 Left,Plantar Metatarsal head first: Gauze and Kerlix/Conform Foam Dressing Change Frequency: Wound #1 Left,Plantar Metatarsal head first: Change dressing every other day. Follow-up Appointments: Return Appointment in 1 week. Edema Control: Wound #1 Left,Plantar Metatarsal head first: Elevate legs to the level of the heart and pump ankles as often as possible Off-Loading: Wound #1 Left,Plantar Metatarsal head first: Open toe surgical shoe with peg assist. Additional Orders / Instructions: Stop Smoking Increase protein intake. JAQUAVIA, PINGEL E. ([id number]) 1. I Georgina Peer suggest currently that we alter the dressing somewhat. We will still use the collagen to moisten this with hydrogel. Subsequently we will then apply Adaptic over this and then continue with the foam as before. Hopefully that will prevent things from drying out. 2. I am also can recommend that she continue with appropriate offloading using the offloading PEG assist surgical shoe. 3. Also recommend that she continue to contemplate smoking cessation obviously I think this could be beneficial for her as well. We will see patient  back for reevaluation in 1 week here in the clinic. If anything worsens or changes patient will contact our office for additional recommendations. Electronic Signature(s) Signed: 07/24/2019 2:02:37 PM By: [name] [name] Entered By: [name] on 07/24/2019 14:02:37 Melinda [name] ([id number]) -------------------------------------------------------------------------------- SuperBill Details Patient Name: Melinda [name] Date of Service: 07/24/2019 Medical Record Number: [id number] Patient Account Number: [id number] Date of Birth/Sex: [birth year] (64 y.o. 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 S81.802A Unspecified open wound, left lower leg, initial encounter E11.42 Type 2 diabetes mellitus with diabetic polyneuropathy L84 Corns and callosities Facility Procedures CPT4 Code: [id number] Description: [id number] - DEBRIDE WOUND 1ST 20 SQ CM OR < Modifier: Quantity: 1 CPT4 Code: Description: ICD-10 Diagnosis Description L97.522 Non-pressure chronic ulcer of other part of left foot with  fat layer exposed Modifier: Quantity: Physician Procedures CPT4 Code: [id number] Description: [id number] - WC PHYS DEBR WO ANESTH 20 SQ CM Modifier: Quantity: 1 CPT4 Code: Description: ICD-10 Diagnosis Description 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sed Modifier: Quantity: Electronic Signature(s) Signed: 07/24/2019 2:02:48 PM By: [name] [name] Entered By: [name] on 07/24/2019 14:02:47

## 2019-07-25 ENCOUNTER — Ambulatory Visit (INDEPENDENT_AMBULATORY_CARE_PROVIDER_SITE_OTHER): Payer: Medicare HMO

## 2019-07-25 ENCOUNTER — Ambulatory Visit (INDEPENDENT_AMBULATORY_CARE_PROVIDER_SITE_OTHER): Payer: Medicare HMO | Admitting: Vascular Surgery

## 2019-07-25 DIAGNOSIS — I7025 Atherosclerosis of native arteries of other extremities with ulceration: Secondary | ICD-10-CM

## 2019-07-28 ENCOUNTER — Encounter (INDEPENDENT_AMBULATORY_CARE_PROVIDER_SITE_OTHER): Payer: Self-pay | Admitting: Vascular Surgery

## 2019-07-31 ENCOUNTER — Ambulatory Visit: Payer: Medicare HMO | Admitting: Physician Assistant

## 2019-08-07 ENCOUNTER — Ambulatory Visit (INDEPENDENT_AMBULATORY_CARE_PROVIDER_SITE_OTHER): Payer: Medicare HMO | Admitting: Gastroenterology

## 2019-08-07 ENCOUNTER — Encounter: Payer: Self-pay | Admitting: Gastroenterology

## 2019-08-07 DIAGNOSIS — K5900 Constipation, unspecified: Secondary | ICD-10-CM

## 2019-08-07 NOTE — Progress Notes (Signed)
Vonda Antigua, MD [address]  [place]  [place], [place] [address]  Main: [phone]  Fax: [phone]   Primary Care Physician: Donnie Coffin, MD  Virtual Visit via Telephone Note  I connected with patient on 08/07/19 at 10:15 AM EDT by telephone and verified that I am speaking with the correct person using two identifiers.   I discussed the limitations, risks, security and privacy concerns of performing an evaluation and management service by telephone and the availability of in person appointments. I also discussed with the patient that there may be a patient responsible charge related to this service. The patient expressed understanding and agreed to proceed.  Location of Patient: Home Location of Provider: Home Persons involved: Patient and provider only during the visit (nursing staff and front desk staff was involved in communicating with the patient prior to the appointment, reviewing medications and checking them in)   History of Present Illness: Chief Complaint  Patient presents with  . Constipation    Patient is having bowel movements every day      HPI: Melinda [name] is a 64 y.o. female here for follow up of constipation. Taking Miralax daily and she states that is working well. She is not straining anymore and she is having a formed BM everyday. She used to see Blood on tissue paper with straining previously and this has resolved.   Current Outpatient Medications  Medication Sig Dispense Refill  . amLODipine (NORVASC) 5 MG tablet     . aspirin 81 MG EC tablet Take 1 tablet (81 mg total) by mouth daily. 30 tablet 0  . atorvastatin (LIPITOR) 80 MG tablet TAKE 1 TABLET EVERY DAY  AT  6  PM 90 tablet 0  . clopidogrel (PLAVIX) 75 MG tablet Take 1 tablet (75 mg total) by mouth daily. 90 tablet 3  . DULoxetine (CYMBALTA) 60 MG capsule     . furosemide (LASIX) 20 MG tablet TAKE 1 TABLET (20 MG TOTAL) BY MOUTH DAILY AS NEEDED. 90 tablet 2  . insulin  aspart (NOVOLOG) 100 UNIT/ML injection Inject 3 Units into the skin 3 (three) times daily with meals. (Patient taking differently: Inject 6 Units into the skin 3 (three) times daily with meals. ) 2.7 mL 0  . insulin glargine (LANTUS) 100 UNIT/ML injection Inject 0.6 mLs (60 Units total) into the skin 2 (two) times daily. (Patient taking differently: Inject 70 Units into the skin 2 (two) times daily. )    . lidocaine (LIDODERM) 5 %     . lisinopril (PRINIVIL,ZESTRIL) 5 MG tablet Take 1 tablet (5 mg total) by mouth daily. 30 tablet 0  . metoprolol tartrate (LOPRESSOR) 25 MG tablet Take 0.5 tablets (12.5 mg total) by mouth 2 (two) times daily. (BETA BLOCKER) 180 tablet 3  . oxybutynin (DITROPAN) 5 MG tablet Take 5 mg by mouth 2 (two) times daily.     . potassium chloride SA (KLOR-CON) 20 MEQ tablet TAKE 1 TABLET (20 MEQ TOTAL) BY MOUTH DAILY AS NEEDED. 90 tablet 3  . pregabalin (LYRICA) 100 MG capsule     . tiZANidine (ZANAFLEX) 4 MG tablet Take 4 mg by mouth 3 (three) times daily.    Marland Kitchen VICTOZA 18 MG/3ML SOPN      No current facility-administered medications for this visit.    Allergies as of 08/07/2019  . (No Known Allergies)    Review of Systems:    All systems reviewed and negative except where noted in HPI.  Observations/Objective:  Labs: CMP     Component Value Date/Time   NA 143 03/14/2018 0707   NA 139 10/10/2016 1911   NA 140 06/03/2014 0405   K 4.5 03/14/2018 0707   K 4.1 06/03/2014 0405   CL 111 03/14/2018 0707   CL 107 06/03/2014 0405   CO2 26 03/14/2018 0707   CO2 26 06/03/2014 0405   GLUCOSE 122 (H) 03/14/2018 0707   GLUCOSE 227 (H) 06/03/2014 0405   BUN 25 (H) 12/23/2018 0851   BUN 16 10/10/2016 1911   BUN 13 06/03/2014 0405   CREATININE 1.45 (H) 12/23/2018 0851   CREATININE 1.13 06/03/2014 0405   CALCIUM 8.4 (L) 03/14/2018 0707   CALCIUM 8.1 (L) 06/03/2014 0405   PROT 7.4 03/12/2018 1616   PROT 6.8 10/10/2016 1911   PROT 7.1 06/01/2014 1725   ALBUMIN 3.2  (L) 03/12/2018 1616   ALBUMIN 3.9 10/10/2016 1911   ALBUMIN 3.2 (L) 06/01/2014 1725   AST 13 (L) 03/12/2018 1616   AST 14 (L) 06/01/2014 1725   ALT 13 03/12/2018 1616   ALT 14 06/01/2014 1725   ALKPHOS 89 03/12/2018 1616   ALKPHOS 103 06/01/2014 1725   BILITOT 0.5 03/12/2018 1616   BILITOT <0.2 10/10/2016 1911   BILITOT 0.4 06/01/2014 1725   GFRNONAA 38 (L) 12/23/2018 0851   GFRNONAA 52 (L) 06/03/2014 0405   GFRNONAA 54 (L) 11/28/2013 1754   GFRAA 44 (L) 12/23/2018 0851   GFRAA >60 06/03/2014 0405   GFRAA >60 11/28/2013 1754   Lab Results  Component Value Date   WBC 11.5 (H) 03/12/2018   HGB 9.1 (L) 03/12/2018   HCT 30.0 (L) 03/12/2018   MCV 72.6 (L) 03/12/2018   PLT 380 03/12/2018    Imaging Studies: VAS [name] ABI WITH/WO TBI  Result Date: 07/28/2019 LOWER EXTREMITY DOPPLER STUDY Indications: Peripheral artery disease.  Vascular Interventions: 12/23/2018: Aortogram and Selective Left Lower Extremity                         Angiogram. PTA of the Left ATA. PTA of the Left Distal                         SFA. Viabahn Stent to the distal Left SFA.                         12/16/2018: Aortogram and Selective Left Lower Extremity                         Angiogram. PTA of the Left Distal SFA. PTA of the Left                         ATA. Viabahn stent placement to the Left Distal SFA.                         05/28/2017: Selective Left Lower Extremity Angiogram.                         PTA of the Left SFA and Proximal POPA. Viabahn covered                         stent to the Left SFA. PTA of the Left PTA.  05/23/2017: Aortogram and Selective Left Lower Extremity                         Angiogram. PTA of the Left PFA.                          10/27/2016: Abdominal Aortagram. Comparison Study: 04/30/2019 Performing Technologist: Almira Coaster RVS  Examination Guidelines: A complete evaluation includes at minimum, Doppler waveform signals and systolic blood pressure reading  at the level of bilateral brachial, anterior tibial, and posterior tibial arteries, when vessel segments are accessible. Bilateral testing is considered an integral part of a complete examination. Photoelectric Plethysmograph (PPG) waveforms and toe systolic pressure readings are included as required and additional duplex testing as needed. Limited examinations for reoccurring indications may be performed as noted.  ABI Findings: +---------+------------------+-----+--------+--------+ Right    Rt Pressure (mmHg)IndexWaveformComment  +---------+------------------+-----+--------+--------+ Brachial 154                                     +---------+------------------+-----+--------+--------+ ATA      75                0.49                  +---------+------------------+-----+--------+--------+ PTA      75                0.49                  +---------+------------------+-----+--------+--------+ Great Toe0                 0.00 Absent           +---------+------------------+-----+--------+--------+ +---------+------------------+-----+----------+---------+ Left     Lt Pressure (mmHg)IndexWaveform  Comment   +---------+------------------+-----+----------+---------+ ATA      129               0.84 monophasic          +---------+------------------+-----+----------+---------+ PERO     23                0.15 monophasic          +---------+------------------+-----+----------+---------+ Great Toe                                 Amputated +---------+------------------+-----+----------+---------+ +-------+-----------+-----------+------------+------------+ ABI/TBIToday's ABIToday's TBIPrevious ABIPrevious TBI +-------+-----------+-----------+------------+------------+ Right  .49        0          .74         abesnt       +-------+-----------+-----------+------------+------------+ Left   .84                   1.04                      +-------+-----------+-----------+------------+------------+ Bilateral ABIs appear decreased compared to prior study on 04/30/2019.  Summary: Right: Resting right ankle-brachial index indicates severe right lower extremity arterial disease. The right toe-brachial index is abnormal. Left: Resting left ankle-brachial index indicates mild left lower extremity arterial disease.  *See table(s) above for measurements and observations.  Electronically signed by [name] MD on 07/28/2019 at 4:34:41 PM.    Final    VAS [name] LOWER EXTREMITY ARTERIAL DUPLEX  Result Date: 07/28/2019 LOWER EXTREMITY ARTERIAL  DUPLEX STUDY Indications: Peripheral artery disease.  Vascular Interventions: 12/23/2018: Aortogram and Selective Left Lower Extremity                         Angiogram. PTA of the Left ATA. PTA of the Left Distal                         SFA. Viabahn Stent to the distal Left SFA.                         12/16/2018: Aortogram and Selective Left Lower Extremity                         Angiogram. PTA of the Left Distal SFA. PTA of the Left                         ATA. Viabahn stent placement to the Left Distal SFA.                         05/28/2017: Selective Left Lower Extremity Angiogram.                         PTA of the Left SFA and Proximal POPA. Viabahn covered                         stent to the Left SFA. PTA of the Left PTA.                         05/23/2017: Aortogram and Selective Left Lower Extremity                         Angiogram. PTA of the Left PFA.                          10/27/2016: Abdominal Aortagram. Current ABI:            Rt .46, Lt .80 Performing Technologist: Almira Coaster RVS  Examination Guidelines: A complete evaluation includes B-mode imaging, spectral Doppler, color Doppler, and power Doppler as needed of all accessible portions of each vessel. Bilateral testing is considered an integral part of a complete examination. Limited examinations for reoccurring indications may be performed as  noted.  +-----------+--------+-----+--------+----------+--------+ RIGHT      PSV cm/sRatioStenosisWaveform  Comments +-----------+--------+-----+--------+----------+--------+ CFA Distal 92                   monophasic         +-----------+--------+-----+--------+----------+--------+ DFA        104                  monophasic         +-----------+--------+-----+--------+----------+--------+ SFA Prox   109                  monophasic         +-----------+--------+-----+--------+----------+--------+ SFA Mid    115                  monophasic         +-----------+--------+-----+--------+----------+--------+ SFA Distal 66  monophasic         +-----------+--------+-----+--------+----------+--------+ POP Distal 71                   monophasic         +-----------+--------+-----+--------+----------+--------+ ATA Distal 14                   monophasic         +-----------+--------+-----+--------+----------+--------+ PTA Distal 18                   monophasic         +-----------+--------+-----+--------+----------+--------+ PERO Distal12                   monophasic         +-----------+--------+-----+--------+----------+--------+  +-----------+--------+-----+--------+----------+--------+ LEFT       PSV cm/sRatioStenosisWaveform  Comments +-----------+--------+-----+--------+----------+--------+ CFA Distal 113                  biphasic           +-----------+--------+-----+--------+----------+--------+ DFA        90                   monophasic         +-----------+--------+-----+--------+----------+--------+ SFA Prox   73                   biphasic           +-----------+--------+-----+--------+----------+--------+ SFA Mid    66                   biphasic           +-----------+--------+-----+--------+----------+--------+ SFA Distal 95                   biphasic            +-----------+--------+-----+--------+----------+--------+ POP Distal 139                  biphasic           +-----------+--------+-----+--------+----------+--------+ ATA Distal 77                   monophasic         +-----------+--------+-----+--------+----------+--------+ PTA Distal 0                    Absent             +-----------+--------+-----+--------+----------+--------+ PERO Distal50                   monophasic         +-----------+--------+-----+--------+----------+--------+  Summary: Right: Imaging and Waveforms obtained throughout in the Right Lower Extremity. Monophasic Waveforms seen throughout. Left: Imaging and Waveforms obtained throughout in the Left Lower Extremity. Biphasic Waveforms seen predominantly with the exception of the Left ATA and Peroneal which is Monophasic. No PTA flow seen; occluded.  See table(s) above for measurements and observations. Electronically signed by [name] MD on 07/28/2019 at 4:34:47 PM.    Final     Assessment and Plan:   Melinda [name] is a 64 y.o. y/o female here for follow-up of constipation and blood per rectum  Assessment and Plan: Constipation has resolved with MiraLAX.  Patient is titrating it well and is having 1 formed bowel movement a day without straining.  Blood per rectum that was occurring on tissue paper only with straining has resolved this with straining has resolved as well  Has not  had a colonoscopy in over 10 years and is scheduled for one next month next month  Follow Up Instructions:    I discussed the assessment and treatment plan with the patient. The patient was provided an opportunity to ask questions and all were answered. The patient agreed with the plan and demonstrated an understanding of the instructions.   The patient was advised to call back or seek an in-person evaluation if the symptoms worsen or if the condition fails to improve as anticipated.  I provided 5 minutes of  non-face-to-face time during this encounter. Additional time was spent in reviewing patient's chart, placing orders etc.   Virgel Manifold, MD  Speech recognition software was used to dictate this note.

## 2019-08-08 ENCOUNTER — Encounter: Payer: Medicare HMO | Attending: Physician Assistant | Admitting: Physician Assistant

## 2019-08-08 ENCOUNTER — Other Ambulatory Visit: Payer: Self-pay

## 2019-08-08 DIAGNOSIS — L84 Corns and callosities: Secondary | ICD-10-CM | POA: Insufficient documentation

## 2019-08-08 DIAGNOSIS — Z6841 Body Mass Index (BMI) 40.0 and over, adult: Secondary | ICD-10-CM | POA: Diagnosis not present

## 2019-08-08 DIAGNOSIS — Z89422 Acquired absence of other left toe(s): Secondary | ICD-10-CM | POA: Insufficient documentation

## 2019-08-08 DIAGNOSIS — I2510 Atherosclerotic heart disease of native coronary artery without angina pectoris: Secondary | ICD-10-CM | POA: Insufficient documentation

## 2019-08-08 DIAGNOSIS — E1151 Type 2 diabetes mellitus with diabetic peripheral angiopathy without gangrene: Secondary | ICD-10-CM | POA: Diagnosis not present

## 2019-08-08 DIAGNOSIS — L97511 Non-pressure chronic ulcer of other part of right foot limited to breakdown of skin: Secondary | ICD-10-CM | POA: Insufficient documentation

## 2019-08-08 DIAGNOSIS — L97522 Non-pressure chronic ulcer of other part of left foot with fat layer exposed: Secondary | ICD-10-CM | POA: Diagnosis not present

## 2019-08-08 DIAGNOSIS — Z951 Presence of aortocoronary bypass graft: Secondary | ICD-10-CM | POA: Insufficient documentation

## 2019-08-08 DIAGNOSIS — E1142 Type 2 diabetes mellitus with diabetic polyneuropathy: Secondary | ICD-10-CM | POA: Insufficient documentation

## 2019-08-08 DIAGNOSIS — E11621 Type 2 diabetes mellitus with foot ulcer: Secondary | ICD-10-CM | POA: Insufficient documentation

## 2019-08-08 DIAGNOSIS — Z794 Long term (current) use of insulin: Secondary | ICD-10-CM | POA: Diagnosis not present

## 2019-08-08 DIAGNOSIS — Z89421 Acquired absence of other right toe(s): Secondary | ICD-10-CM | POA: Insufficient documentation

## 2019-08-08 DIAGNOSIS — E669 Obesity, unspecified: Secondary | ICD-10-CM | POA: Diagnosis not present
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
## 2019-08-08 NOTE — Progress Notes (Signed)
KOOPER, CALDARERA ([id number]) Visit Report for 08/08/2019 Arrival Information Details Patient Name: Melinda [name], Melinda [name] Date of Service: 08/08/2019 12:30 PM Medical Record Number: [id number] Patient Account Number: [id number] Date of Birth/Sex: [birth year] (64 y.o. F) Treating RN: Army Melia Primary Care Melicia Esqueda: [name] Other Clinician: Referring Ronal Maybury: [name] Treating Antonetta Clanton/Extender: [name], [name]: 23 Visit Information History Since Last Visit Added or deleted any medications: No Patient Arrived: Ambulatory Any new allergies or adverse reactions: No Arrival Time: 12:38 Had a fall or experienced change in No Accompanied By: self activities of daily living that may affect Transfer Assistance: None risk of falls: Patient Identification Verified: Yes Signs or symptoms of abuse/neglect since last visito No Patient Has Alerts: Yes Hospitalized since last visit: No Patient Alerts: Patient on Blood Thinner Has Dressing in Place as Prescribed: Yes Plavix, Aspirin 81mg  Pain Present Now: No ABI's L .88/R .71 Electronic Signature(s) Signed: 08/08/2019 2:25:19 PM By: Army Melia Entered By: Army Melia on 08/08/2019 12:38:57 Melinda [name] ([id number]) -------------------------------------------------------------------------------- Lower Extremity Assessment Details Patient Name: Melinda [name] Date of Service: 08/08/2019 12:30 PM Medical Record Number: [id number] Patient Account Number: [id number] Date of Birth/Sex: [birth year] (64 y.o. F) Treating RN: Army Melia Primary Care Jhana Giarratano: [name] Other Clinician: Referring Qaadir Kent: [name] Treating Archimedes Harold/Extender: [name], [name]: 23 Edema Assessment Assessed: [Left: No] [Right: No] Edema: [Left: No] [Right: No] Vascular Assessment Pulses: Dorsalis Pedis Palpable: [Left:Yes] [Right:Yes] Electronic Signature(s) Signed: 08/08/2019 2:25:19 PM By: Army Melia Entered  By: Army Melia on 08/08/2019 12:44:35 Melinda [name] ([id number]) -------------------------------------------------------------------------------- Multi Wound Chart Details Patient Name: Melinda [name] Date of Service: 08/08/2019 12:30 PM Medical Record Number: [id number] Patient Account Number: [id number] Date of Birth/Sex: Jan 18, 1956 (65 y.o. 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 Lower N/A Extremity Extremity Comorbid History: Cataracts, Coronary Artery Disease, Cataracts, Coronary Artery Disease, N/A Hypertension, Myocardial Infarction, Hypertension, Myocardial Infarction, Peripheral Venous Disease, Type II Peripheral Venous Disease, Type II Diabetes, Osteoarthritis, Diabetes, Osteoarthritis, Osteomyelitis, Neuropathy Osteomyelitis, Neuropathy Date Acquired: 11/30/2018 08/07/2019 N/A Weeks of Treatment: 23 0 N/A Wound Status: Open Open N/A Measurements L x W x D (cm) 0.5x0.5x0.2 2.2x1.5x0.1 N/A Area (cm) : 0.196 2.592 N/A Volume (cm) : 0.039 0.259 N/A % Reduction in Area: 93.70% N/A N/A % Reduction in Volume: 97.50% N/A N/A Classification: Grade 2 Grade 2 N/A Exudate Amount: Medium Medium N/A Exudate Type: Serosanguineous Serosanguineous N/A Exudate Color: red, brown red, brown N/A Wound Margin: Epibole N/A N/A Granulation Amount: Large (67-100%) Large (67-100%) N/A Granulation Quality: Pale Red N/A Necrotic Amount: None Present (0%) Small  (1-33%) N/A Exposed Structures: Fat Layer (Subcutaneous Tissue) Fat Layer (Subcutaneous Tissue) N/A Exposed: Yes Exposed: Yes Fascia: No Fascia: No Tendon: No Tendon: No Muscle: No Muscle: No Joint: No Joint: No Bone: No Bone: No Epithelialization: Medium (34-66%) None N/A Treatment Notes Electronic Signature(s) EMMI, GUSTAVE ([id number]) Signed: 08/08/2019 3:27:22 PM By: [name] Entered By: [name] on 08/08/2019 12:54:46 Melinda [name] ([id number]) -------------------------------------------------------------------------------- Multi-Disciplinary Care Plan Details Patient Name: Melinda [name] Date of Service: 08/08/2019 12:30 PM Medical  Record Number: [id number] Patient Account Number: [id number] Date of Birth/Sex: 1955-10-21 (64 y.o. F) Treating RN: [name] Primary Care Leontina Skidmore: [name] Other Clinician: Referring Peaches Vanoverbeke: [name] Treating Joshoa Shawler/Extender: [name], [name]: 23 Active Inactive Medication Nursing Diagnoses: Knowledge deficit related to medication safety: actual or potential Goals: Patient/caregiver will demonstrate understanding of all current medications Date Initiated: 02/26/2019 Target Resolution Date: 02/26/2019 Goal Status: Active Interventions: Assess for medication contraindications each visit where new medications are prescribed Notes: Necrotic Tissue Nursing Diagnoses: Impaired tissue integrity related to necrotic/devitalized tissue Goals: Necrotic/devitalized tissue will be minimized in the wound bed Date Initiated: 02/26/2019 Target Resolution Date: 03/06/2019 Goal Status: Active Interventions: Assess patient pain level pre-, during and post procedure and prior to discharge Treatment Activities: Apply topical anesthetic as ordered : 02/26/2019 Notes: Pressure Nursing Diagnoses: Knowledge deficit related to causes and risk factors for pressure ulcer development Knowledge deficit  related to management of pressures ulcers Goals: Patient will remain free from development of additional pressure ulcers Date Initiated: 02/26/2019 Target Resolution Date: 02/26/2019 Goal Status: Active Patient will remain free of pressure ulcers Date Initiated: 02/26/2019 Target Resolution Date: 02/26/2019 Goal Status: Active Patient/caregiver will verbalize understanding of pressure ulcer management Date Initiated: 02/26/2019 Target Resolution Date: 02/26/2019 Goal Status: Active LATEISHA, DEITZ ([id number]) Interventions: Assess: immobility, friction, shearing, incontinence upon admission and as needed Notes: Wound/Skin Impairment Nursing Diagnoses: Impaired tissue integrity Goals: Ulcer/skin breakdown will have a volume reduction of 30% by week 4 Date Initiated: 02/26/2019 Target Resolution Date: 03/29/2019 Goal Status: Active Interventions: Provide education on ulcer and skin care Treatment Activities: Topical wound management initiated : 02/26/2019 Notes: Electronic Signature(s) Signed: 08/08/2019 3:27:22 PM By: [name] Entered By: [name] on 08/08/2019 12:54:30 Melinda [name] ([id number]) -------------------------------------------------------------------------------- Pain Assessment Details Patient Name: Melinda [name] Date of Service: 08/08/2019 12:30 PM Medical Record Number: [id number] Patient Account Number: [id number] Date of Birth/Sex: 1956-02-24 (64 y.o. F) Treating RN: Army Melia Primary Care Sadia Belfiore: [name] Other Clinician: Referring Omnia Dollinger: [name] Treating Suhaylah Wampole/Extender: [name], [name]: 23 Active Problems Location of Pain Severity and Description of Pain Patient Has Paino No Site Locations Pain Management and Medication Current Pain Management: Electronic Signature(s) Signed: 08/08/2019 2:25:19 PM By: Army Melia Entered By: Army Melia on 08/08/2019 12:39:32 Melinda [name]  ([id number]) -------------------------------------------------------------------------------- Patient/Caregiver Education Details Patient Name: Melinda [name] Date of Service: 08/08/2019 12:30 PM Medical Record Number: [id number] Patient Account Number: [id number] Date of Birth/Gender: 1955/11/08 (64 y.o. F) Treating RN: [name] Primary Care Physician: [name] Other Clinician: Referring Physician: [name] Treating Physician/Extender: Sharalyn Ink in Treatment: 3 Education Assessment Education Provided To: Patient and Caregiver Education Topics Provided Wound/Skin Impairment: Handouts: Other: wound care as ordered Methods: Demonstration, Explain/Verbal Responses: State content correctly Electronic Signature(s) Signed: 08/08/2019 3:27:22 PM By: [name] Entered By: [name] on 08/08/2019 13:30:15 Melinda [name] ([id number]) -------------------------------------------------------------------------------- Wound Assessment Details Patient Name: Melinda [name] Date of Service: 08/08/2019 12:30 PM Medical Record Number: [id number] Patient Account Number: [id number] Date of Birth/Sex: [birth year] (64 y.o. F) Treating RN: Army Melia Primary Care Vollie Aaron: [name] Other Clinician: Referring Dallis Czaja: [name] Treating Jace Dowe/Extender: [name], [name]: 23 Wound Status Wound Number: 1 Primary Diabetic Wound/Ulcer of the Lower Extremity Etiology: Wound Location: Left, Plantar Metatarsal head first Wound Open Wounding Event: Gradually Appeared Status: Date Acquired: 11/30/2018 Comorbid Cataracts, Coronary Artery Disease, Hypertension, Weeks Of Treatment: 23 History: Myocardial Infarction, Peripheral Venous Disease,  Type II Clustered Wound: No Diabetes, Osteoarthritis, Osteomyelitis, Neuropathy Photos Wound Measurements Length: (cm) 0.5 Width: (cm) 0.5 Depth: (cm) 0.2 Area: (cm) 0.196 Volume: (cm) 0.039 %  Reduction in Area: 93.7% % Reduction in Volume: 97.5% Epithelialization: Medium (34-66%) Wound Description Classification: Grade 2 F Wound Margin: Epibole S Exudate Amount: Medium Exudate Type: Serosanguineous Exudate Color: red, brown oul Odor After Cleansing: No lough/Fibrino No Wound Bed Granulation Amount: Large (67-100%) Exposed Structure Granulation Quality: Pale Fascia Exposed: No Necrotic Amount: None Present (0%) Fat Layer (Subcutaneous Tissue) Exposed: Yes Tendon Exposed: No Muscle Exposed: No Joint Exposed: No Bone Exposed: No Treatment Notes Wound #1 (Left, Plantar Metatarsal head first) Notes prisma, adaptic, Foam, conform to foot Electronic Signature(s) NIJIA, BRITTEN ([id number]) Signed: 08/08/2019 2:25:19 PM By: Army Melia Entered By: Army Melia on 08/08/2019 12:44:18 Melinda [name] ([id number]) -------------------------------------------------------------------------------- Wound Assessment Details Patient Name: Melinda [name] Date of Service: 08/08/2019 12:30 PM Medical Record Number: [id number] Patient Account Number: [id number] Date of Birth/Sex: [birth year] (64 y.o. F) Treating RN: Army Melia Primary Care Norrin Shreffler: [name] Other Clinician: Referring Enya Bureau: [name] Treating Clarnce Homan/Extender: [name], [name]: 23 Wound Status Wound Number: 4 Primary Diabetic Wound/Ulcer of the Lower Extremity Etiology: Wound Location: Right, Medial Foot Wound Open Wounding Event: Blister Status: Date Acquired: 08/07/2019 Comorbid Cataracts, Coronary Artery Disease, Hypertension, Weeks Of Treatment: 0 History: Myocardial Infarction, Peripheral Venous Disease, Type II Clustered Wound: No Diabetes, Osteoarthritis, Osteomyelitis, Neuropathy Photos Wound Measurements Length: (cm) 2.2 % Red Width: (cm) 1.5 % Red Depth: (cm) 0.1 Epith Area: (cm) 2.592 Tunn Volume: (cm) 0.259 Unde uction in Area: uction in  Volume: elialization: None eling: No rmining: No Wound Description Classification: Grade 2 Foul Exudate Amount: Medium Sloug Exudate Type: Serosanguineous Exudate Color: red, brown Odor After Cleansing: No h/Fibrino Yes Wound Bed Granulation Amount: Large (67-100%) Exposed Structure Granulation Quality: Red Fascia Exposed: No Necrotic Amount: Small (1-33%) Fat Layer (Subcutaneous Tissue) Exposed: Yes Necrotic Quality: Adherent Slough Tendon Exposed: No Muscle Exposed: No Joint Exposed: No Bone Exposed: No Treatment Notes Wound #4 (Right, Medial Foot) Notes prisma, adaptic, Foam, conform to foot Electronic Signature(s) Signed: 08/08/2019 2:25:19 PM By: Jolyne Loa, Sonia Side ([id number]) Entered By: Army Melia on 08/08/2019 12:43:44 Melinda [name] ([id number]) -------------------------------------------------------------------------------- Vitals Details Patient Name: Melinda [name] Date of Service: 08/08/2019 12:30 PM Medical Record Number: [id number] Patient Account Number: [id number] Date of Birth/Sex: Jul 28, 1955 (64 y.o. F) Treating RN: Army Melia Primary Care Arva Slaugh: [name] Other Clinician: Referring Dayan Kreis: [name] Treating Kylon Philbrook/Extender: [name], [name]: 23 Vital Signs Time Taken: 12:38 Temperature (F): 99.1 Height (in): 68 Pulse (bpm): 94 Weight (lbs): 300 Respiratory Rate (breaths/min): 16 Body Mass Index (BMI): 45.6 Blood Pressure (mmHg): 146/55 Reference Range: 80 - 120 mg / dl Electronic Signature(s) Signed: 08/08/2019 2:25:19 PM By: Army Melia Entered By: Army Melia on 08/08/2019 12:39:27

## 2019-08-13 ENCOUNTER — Other Ambulatory Visit: Payer: Self-pay | Admitting: Cardiovascular Disease

## 2019-08-13 DIAGNOSIS — E1159 Type 2 diabetes mellitus with other circulatory complications: Secondary | ICD-10-CM

## 2019-08-13 DIAGNOSIS — I2510 Atherosclerotic heart disease of native coronary artery without angina pectoris: Secondary | ICD-10-CM

## 2019-08-15 ENCOUNTER — Encounter: Payer: Medicare HMO | Admitting: Physician Assistant

## 2019-08-15 ENCOUNTER — Telehealth: Payer: Self-pay | Admitting: Dietician

## 2019-08-15 ENCOUNTER — Other Ambulatory Visit: Payer: Self-pay

## 2019-08-15 DIAGNOSIS — E11621 Type 2 diabetes mellitus with foot ulcer: Secondary | ICD-10-CM | POA: Diagnosis not present

## 2019-08-15 NOTE — Telephone Encounter (Signed)
Returned message from patient inquiring about her status regarding bariatric surgery. Advised her to contact surgeon's office; will send email to bariatric coordinator to consult patient.
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
## 2019-08-21 NOTE — Progress Notes (Signed)
Melinda [name] ([id number]) Visit Report for 08/15/2019 Arrival Information Details Patient Name: Melinda [name], Melinda [name] Date of Service: 08/15/2019 2:45 PM Medical Record Number: [id number] Patient Account Number: [id number] Date of Birth/Sex: 1956/04/20 (64 y.o. F) Treating RN: [name] Primary Care Jaythen Hamme: [name] Other Clinician: Referring Thurma Priego: [name] Treating Ellia Knowlton/Extender: [name], [name]: 24 Visit Information History Since Last Visit Added or deleted any medications: No Patient Arrived: Ambulatory Any new allergies or adverse reactions: No Arrival Time: 14:37 Had a fall or experienced change in No Accompanied By: self activities of daily living that may affect Transfer Assistance: None risk of falls: Patient Identification Verified: Yes Signs or symptoms of abuse/neglect since last visito No Secondary Verification Process Completed: Yes Implantable device outside of the clinic excluding No Patient Has Alerts: Yes cellular tissue based products placed in the center Patient Alerts: Patient on Blood Thinner since last visit: Plavix, Aspirin 81mg  Has Dressing in Place as Prescribed: Yes ABI's L .88/R .71 Pain Present Now: No Electronic Signature(s) Signed: 08/15/2019 4:24:29 PM By: Lorine Bears RCP, RRT, CHT Entered By: Lorine Bears on 08/15/2019 14:39:15 Melinda [name] ([id number]) -------------------------------------------------------------------------------- Lower Extremity Assessment Details Patient Name: Melinda [name] Date of Service: 08/15/2019 2:45 PM Medical Record Number: [id number] Patient Account Number: [id number] Date of Birth/Sex: 07-21-1955 (64 y.o. F) Treating RN: [name] Primary Care Mckaylah Bettendorf: [name] Other Clinician: Referring Malikhi Ogan: [name] Treating Nicco Reaume/Extender: [name], [name]: 24 Edema Assessment Assessed: [Left: No] [Right:  No] Edema: [Left: No] [Right: No] Vascular Assessment Pulses: Dorsalis Pedis Palpable: [Left:Yes] [Right:Yes] Electronic Signature(s) Signed: 08/21/2019 5:10:21 PM By: [name], [name], RN, [name], [name] RN, [name] Entered By: [name], [name], RN, [name], [name] on 08/15/2019 14:51:57 Melinda [name] ([id number]) -------------------------------------------------------------------------------- Multi Wound Chart Details Patient Name: Melinda [name] Date of Service: 08/15/2019 2:45 PM Medical Record Number: [id number] Patient Account Number: [id number] Date of Birth/Sex: 1955/07/05 (65 y.o. F) Treating RN: [name] Primary Care Pascale Maves: [name] Other Clinician: Referring Branston Halsted: [name] Treating Vontae Court/Extender: [name], [name]: 24 Vital Signs Height(in): 27 Pulse(bpm): 26 Weight(lbs): 300 Blood Pressure(mmHg): 149/61 Body Mass Index(BMI): 46 Temperature(F): 99.3 Respiratory Rate(breaths/min): 18 Photos: [1:No Photos] [4:No Photos] [N/A:N/A] Wound Location: [1:Left, Plantar Metatarsal head first] [4:Right, Medial Foot] [N/A:N/A] Wounding Event: [1:Gradually Appeared] [4:Blister] [N/A:N/A] Primary Etiology: [1:Diabetic Wound/Ulcer of the Lower Extremity] [4:Diabetic Wound/Ulcer of the Lower Extremity] [N/A:N/A] Comorbid History: [1:Cataracts, Coronary Artery Disease, Hypertension, Myocardial Infarction, Peripheral Venous Disease, Type II Diabetes, Osteoarthritis, Osteomyelitis, Neuropathy] [4:Cataracts, Coronary Artery Disease, Hypertension, Myocardial  Infarction, Peripheral Venous Disease, Type II Diabetes, Osteoarthritis, Osteomyelitis, Neuropathy] [N/A:N/A] Date Acquired: [1:11/30/2018] [4:08/07/2019] [N/A:N/A] Weeks of Treatment: [1:24] [4:1] [N/A:N/A] Wound Status: [1:Open] [4:Open] [N/A:N/A] Measurements L x W x D (cm) [1:0.6x0.4x0.4] [4:3.1x1.3x0.1] [N/A:N/A] Area (cm) : [1:0.188] [4:3.165] [N/A:N/A] Volume (cm) : [1:0.075] [4:0.317] [N/A:N/A] % Reduction in  Area: [1:94.00%] [4:-22.10%] [N/A:N/A] % Reduction in Volume: [1:95.20%] [4:-22.40%] [N/A:N/A] Classification: [1:Grade 2] [4:Grade 2] [N/A:N/A] Exudate Amount: [1:Medium] [4:Medium] [N/A:N/A] Exudate Type: [1:Serosanguineous] [4:Serosanguineous] [N/A:N/A] Exudate Color: [1:red, brown] [4:red, brown] [N/A:N/A] Wound Margin: [1:Epibole] [4:Flat and Intact] [N/A:N/A] Granulation Amount: [1:Large (67-100%)] [4:Large (67-100%)] [N/A:N/A] Granulation Quality: [1:Pink, Pale] [4:Red, Pink] [N/A:N/A] Necrotic Amount: [1:None Present (0%)] [4:None Present (0%)] [N/A:N/A] Exposed Structures: [1:Fat Layer (Subcutaneous Tissue) Exposed: Yes Fascia: No Tendon: No Muscle: No Joint: No Bone: No Small (1-33%)] [4:Fat Layer (Subcutaneous Tissue) Exposed: Yes Fascia: No Tendon: No Muscle: No Joint: No Bone: No Small (1-33%)] [N/A:N/A N/A] Treatment  Notes Electronic Signature(s) Signed: 08/21/2019 5:10:21 PM By: [name], [name], RN, [name], [name] RN, [name] Entered By: [name], [name], RN, [name], [name] on 08/15/2019 14:52:22 Melinda [name] ([id number]) -------------------------------------------------------------------------------- [place] Details Patient Name: Melinda [name] Date of Service: 08/15/2019 2:45 PM Medical Record Number: [id number] Patient Account Number: [id number] Date of Birth/Sex: March 18, 1956 (64 y.o. F) Treating RN: [name] Primary Care Cashis Rill: [name] Other Clinician: Referring Mechel Schutter: [name] Treating Ridwan Bondy/Extender: [name], [name]: 24 Active Inactive Medication Nursing Diagnoses: Knowledge deficit related to medication safety: actual or potential Goals: Patient/caregiver will demonstrate understanding of all current medications Date Initiated: 02/26/2019 Target Resolution Date: 02/26/2019 Goal Status: Active Interventions: Assess for medication contraindications each visit where new medications are prescribed Notes: Necrotic  Tissue Nursing Diagnoses: Impaired tissue integrity related to necrotic/devitalized tissue Goals: Necrotic/devitalized tissue will be minimized in the wound bed Date Initiated: 02/26/2019 Target Resolution Date: 03/06/2019 Goal Status: Active Interventions: Assess patient pain level pre-, during and post procedure and prior to discharge Treatment Activities: Apply topical anesthetic as ordered : 02/26/2019 Notes: Pressure Nursing Diagnoses: Knowledge deficit related to causes and risk factors for pressure ulcer development Knowledge deficit related to management of pressures ulcers Goals: Patient will remain free from development of additional pressure ulcers Date Initiated: 02/26/2019 Target Resolution Date: 02/26/2019 Goal Status: Active Patient will remain free of pressure ulcers Date Initiated: 02/26/2019 Target Resolution Date: 02/26/2019 Goal Status: Active Patient/caregiver will verbalize understanding of pressure ulcer management Date Initiated: 02/26/2019 Target Resolution Date: 02/26/2019 Goal Status: Active GABBRIELLA, STROMGREN ([id number]) Interventions: Assess: immobility, friction, shearing, incontinence upon admission and as needed Notes: Wound/Skin Impairment Nursing Diagnoses: Impaired tissue integrity Goals: Ulcer/skin breakdown will have a volume reduction of 30% by week 4 Date Initiated: 02/26/2019 Target Resolution Date: 03/29/2019 Goal Status: Active Interventions: Provide education on ulcer and skin care Treatment Activities: Topical wound management initiated : 02/26/2019 Notes: Electronic Signature(s) Signed: 08/21/2019 5:10:21 PM By: [name], [name], RN, [name], [name] RN, [name] Entered By: [name], [name], RN, [name], [name] on 08/15/2019 14:52:10 Melinda [name] ([id number]) -------------------------------------------------------------------------------- Pain Assessment Details Patient Name: Melinda [name] Date of Service: 08/15/2019 2:45 PM Medical Record Number:  [id number] Patient Account Number: [id number] Date of Birth/Sex: 29-Jul-1955 (64 y.o. F) Treating RN: [name] Primary Care Kendricks Reap: [name] Other Clinician: Referring Alexandrina Fiorini: [name] Treating Thelda Gagan/Extender: [name], [name]: 24 Active Problems Location of Pain Severity and Description of Pain Patient Has Paino No Site Locations Pain Management and Medication Current Pain Management: Electronic Signature(s) Signed: 08/21/2019 5:10:21 PM By: [name], [name], RN, [name], [name] RN, [name] Entered By: [name], [name], RN, [name], [name] on 08/15/2019 14:44:13 Melinda [name] ([id number]) -------------------------------------------------------------------------------- Patient/Caregiver Education Details Patient Name: Melinda [name] Date of Service: 08/15/2019 2:45 PM Medical Record Number: [id number] Patient Account Number: [id number] Date of Birth/Gender: 06-Dec-1955 (64 y.o. F) Treating RN: [name] Primary Care Physician: [name] Other Clinician: Referring Physician: [name] Treating Physician/Extender: Sharalyn Ink in Treatment: 24 Education Assessment Education Provided To: Patient Education Topics Provided Wound/Skin Impairment: Handouts: Other: wound care s ordered Methods: Demonstration, Explain/Verbal Responses: State content correctly Electronic Signature(s) Signed: 08/15/2019 4:33:37 PM By: [name] Entered By: [name] on 08/15/2019 [place], Luther E. ([id number]) -------------------------------------------------------------------------------- Wound Assessment Details Patient Name: Melinda [name] Date of Service: 08/15/2019 2:45 PM Medical Record Number: [id number] Patient Account Number: [id number] Date of Birth/Sex: 09-Jan-1956 (65 y.o. F) Treating RN: [name] Primary Care Danise Dehne: [name] Other  Clinician: Referring Hosea Hanawalt: [name] Treating Ronee Ranganathan/Extender: [name], [name]:  24 Wound Status Wound Number: 1 Primary Diabetic Wound/Ulcer of the Lower Extremity Etiology: Wound Location: Left, Plantar Metatarsal head first Wound Open Wounding Event: Gradually Appeared Status: Date Acquired: 11/30/2018 Comorbid Cataracts, Coronary Artery Disease, Hypertension, Weeks Of Treatment: 24 History: Myocardial Infarction, Peripheral Venous Disease, Type II Clustered Wound: No Diabetes, Osteoarthritis, Osteomyelitis, Neuropathy Photos Wound Measurements Length: (cm) 0.6 % Width: (cm) 0.4 % Depth: (cm) 0.2 E Area: (cm) 0.188 Volume: (cm) 0.038 Reduction in Area: 94% Reduction in Volume: 97.6% pithelialization: Small (1-33%) Tunneling: No Undermining: No Wound Description Classification: Grade 2 F Wound Margin: Epibole S Exudate Amount: Medium Exudate Type: Serosanguineous Exudate Color: red, brown oul Odor After Cleansing: No lough/Fibrino No Wound Bed Granulation Amount: Large (67-100%) Exposed Structure Granulation Quality: Pink, Pale Fascia Exposed: No Necrotic Amount: None Present (0%) Fat Layer (Subcutaneous Tissue) Exposed: Yes Tendon Exposed: No Muscle Exposed: No Joint Exposed: No Bone Exposed: No Treatment Notes Wound #1 (Left, Plantar Metatarsal head first) Notes prisma, adaptic, Foam, conform to foot Electronic Signature(s) AILA, SAGEL ([id number]) Signed: 08/21/2019 5:10:21 PM By: [name], [name], RN, [name], [name] RN, [name] Entered By: [name], [name], RN, [name], [name] on 08/15/2019 15:08:21 Melinda [name] ([id number]) -------------------------------------------------------------------------------- Wound Assessment Details Patient Name: Melinda [name] Date of Service: 08/15/2019 2:45 PM Medical Record Number: [id number] Patient Account Number: [id number] Date of Birth/Sex: [birth year] (64 y.o. F) Treating RN: [name] Primary Care Esmee Fallaw: [name] Other Clinician: Referring Bonifacio Pruden: [name] Treating Nichola Warren/Extender: [name],  [name]: 24 Wound Status Wound Number: 4 Primary Diabetic Wound/Ulcer of the Lower Extremity Etiology: Wound Location: Right, Medial Foot Wound Open Wounding Event: Blister Status: Date Acquired: 08/07/2019 Comorbid Cataracts, Coronary Artery Disease, Hypertension, Weeks Of Treatment: 1 History: Myocardial Infarction, Peripheral Venous Disease, Type II Clustered Wound: No Diabetes, Osteoarthritis, Osteomyelitis, Neuropathy Photos Wound Measurements Length: (cm) 2 % R Width: (cm) 1 % R Depth: (cm) 0.1 Epi Area: (cm) 1.571 Tu Volume: (cm) 0.157 Un eduction in Area: 39.4% eduction in Volume: 39.4% thelialization: Small (1-33%) nneling: No dermining: No Wound Description Classification: Grade 2 Fou Wound Margin: Flat and Intact Slo Exudate Amount: Medium Exudate Type: Serosanguineous Exudate Color: red, brown l Odor After Cleansing: No ugh/Fibrino No Wound Bed Granulation Amount: Large (67-100%) Exposed Structure Granulation Quality: Red, Pink Fascia Exposed: No Necrotic Amount: None Present (0%) Fat Layer (Subcutaneous Tissue) Exposed: Yes Tendon Exposed: No Muscle Exposed: No Joint Exposed: No Bone Exposed: No Treatment Notes Wound #4 (Right, Medial Foot) Notes prisma, adaptic, Foam, conform to foot Electronic Signature(s) AVEREIGH, GRISSETT ([id number]) Signed: 08/21/2019 5:10:21 PM By: [name], [name], RN, [name], [name] RN, [name] Entered By: [name], [name], RN, [name], [name] on 08/15/2019 15:08:57 Melinda [name] ([id number]) -------------------------------------------------------------------------------- Vitals Details Patient Name: Melinda [name] Date of Service: 08/15/2019 2:45 PM Medical Record Number: [id number] Patient Account Number: [id number] Date of Birth/Sex: Apr 18, 1956 (64 y.o. F) Treating RN: [name] Primary Care Nataliyah Packham: [name] Other Clinician: Referring Tikita Mabee: [name] Treating Ariyah Sedlack/Extender: [name], [name] Weeks in  Treatment: 24 Vital Signs Time Taken: 14:35 Temperature (F): 99.3 Height (in): 68 Pulse (bpm): 98 Weight (lbs): 300 Respiratory Rate (breaths/min): 18 Body Mass Index (BMI): 45.6 Blood Pressure (mmHg): 149/61 Reference Range: 80 - 120 mg / dl Electronic Signature(s) Signed: 08/15/2019 4:24:29 PM By: Lorine Bears RCP, RRT, CHT Entered By: Lorine Bears on 08/15/2019 14:40:10

## 2019-08-29 ENCOUNTER — Encounter: Payer: Medicare HMO | Admitting: Physician Assistant

## 2019-08-29 ENCOUNTER — Other Ambulatory Visit: Payer: Self-pay

## 2019-08-29 DIAGNOSIS — E11621 Type 2 diabetes mellitus with foot ulcer: Secondary | ICD-10-CM | POA: Diagnosis not present
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
## 2019-08-29 NOTE — Progress Notes (Addendum)
Melinda, [name] ([id number]) Visit Report for 08/29/2019 Arrival Information Details Patient Name: Melinda [name], Melinda [name] Date of Service: 08/29/2019 1:30 PM Medical Record Number: [id number] Patient Account Number: [id number] Date of Birth/Sex: 11-Sep-1955 (64 y.o. F) Treating RN: [name] Primary Care Tryniti Laatsch: [name] Other Clinician: Referring Tillman Kazmierski: [name] Treating Jonda Alanis/Extender: [name], [name]: 26 Visit Information History Since Last Visit Added or deleted any medications: No Patient Arrived: Ambulatory Any new allergies or adverse reactions: No Arrival Time: 13:38 Had a fall or experienced change in No Accompanied By: self activities of daily living that may affect Transfer Assistance: None risk of falls: Patient Identification Verified: Yes Signs or symptoms of abuse/neglect since last visito No Secondary Verification Process Completed: Yes Hospitalized since last visit: No Patient Has Alerts: Yes Implantable device outside of the clinic excluding No Patient Alerts: Patient on Blood Thinner cellular tissue based products placed in the center Plavix, Aspirin 81mg  since last visit: ABI's L .88/R .71 Has Dressing in Place as Prescribed: Yes Pain Present Now: No Electronic Signature(s) Signed: 08/29/2019 4:14:40 PM By: Lorine Bears RCP, RRT, CHT Entered By: Lorine Bears on 08/29/2019 13:39:00 Melinda [name] ([id number]) -------------------------------------------------------------------------------- Encounter Discharge Information Details Patient Name: Melinda [name] Date of Service: 08/29/2019 1:30 PM Medical Record Number: [id number] Patient Account Number: [id number] Date of Birth/Sex: 1955-09-05 (64 y.o. F) Treating RN: [name] Primary Care Azavion Bouillon: [name] Other Clinician: Referring Furious Chiarelli: [name] Treating Deeana Atwater/Extender: [name], [name]: 26 Encounter  Discharge Information Items Post Procedure Vitals Discharge Condition: Stable Temperature (F): 99.0 Ambulatory Status: Ambulatory Pulse (bpm): 91 Discharge Destination: Home Respiratory Rate (breaths/min): 16 Transportation: Private Auto Blood Pressure (mmHg): 152/77 Accompanied By: self Schedule Follow-up Appointment: Yes Clinical Summary of Care: Electronic Signature(s) Signed: 08/29/2019 4:19:26 PM By: [name] Entered By: [name] on 08/29/2019 14:14:36 Melinda [name] ([id number]) -------------------------------------------------------------------------------- Lower Extremity Assessment Details Patient Name: Melinda [name] Date of Service: 08/29/2019 1:30 PM Medical Record Number: [id number] Patient Account Number: [id number] Date of Birth/Sex: 10/31/1955 (64 y.o. F) Treating RN: Army Melia Primary Care Mason Burleigh: [name] Other Clinician: Referring Kiandria Clum: [name] Treating Markiya Keefe/Extender: [name], [name]: 26 Edema Assessment Assessed: [Left: No] [Right: No] Edema: [Left: No] [Right: No] Vascular Assessment Pulses: Dorsalis Pedis Palpable: [Left:Yes] [Right:Yes] Electronic Signature(s) Signed: 08/29/2019 3:34:13 PM By: Army Melia Entered By: Army Melia on 08/29/2019 13:48:56 Melinda [name] ([id number]) -------------------------------------------------------------------------------- Multi Wound Chart Details Patient Name: Melinda [name] Date of Service: 08/29/2019 1:30 PM Medical Record Number: [id number] Patient Account Number: [id number] Date of Birth/Sex: 1955-08-02 (64 y.o. 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 N/A Extremity Extremity Comorbid History: Cataracts, Coronary Artery Disease, Cataracts, Coronary Artery Disease, N/A Hypertension, Myocardial Infarction, Hypertension, Myocardial Infarction, Peripheral Venous Disease, Type II Peripheral Venous Disease, Type II Diabetes, Osteoarthritis, Diabetes, Osteoarthritis, Osteomyelitis, Neuropathy Osteomyelitis, Neuropathy Date Acquired: 11/30/2018 08/07/2019 N/A Weeks of Treatment: 26 3 N/A Wound Status: Open Healed - Epithelialized N/A Measurements L x W x D (cm) 0.4x0.3x0.2 0x0x0 N/A Area (cm) : 0.094 0 N/A Volume (  cm) : 0.019 0 N/A % Reduction in Area: 97.00% 100.00% N/A % Reduction in Volume: 98.80% 100.00% N/A Classification: Grade 2 Grade 2 N/A Exudate Amount: Medium Medium N/A Exudate Type: Serosanguineous Serosanguineous N/A Exudate Color: red, brown red, brown N/A Wound Margin: Epibole Flat and Intact N/A Granulation Amount: Large (67-100%) Large (67-100%) N/A Granulation Quality: Pink, Pale Red, Pink N/A Necrotic Amount: None Present (0%) None Present (0%) N/A Exposed Structures: Fat Layer (Subcutaneous Tissue) Fat Layer (Subcutaneous Tissue) N/A Exposed: Yes Exposed: Yes Fascia: No Fascia: No Tendon: No Tendon: No Muscle: No Muscle: No Joint: No Joint: No Bone: No Bone: No Epithelialization: Small (1-33%) Small (1-33%) N/A Treatment Notes Electronic Signature(s) Signed: 08/29/2019 4:19:26 PM By: [name] Entered By: [name] on 08/29/2019 14:07:42 Melinda [name] ([id number]) Davina Poke, Sonia Side ([id number]) -------------------------------------------------------------------------------- Multi-Disciplinary Care Plan Details Patient Name: Melinda [name] Date of  Service: 08/29/2019 1:30 PM Medical Record Number: [id number] Patient Account Number: [id number] Date of Birth/Sex: Jun 11, 1955 (64 y.o. F) Treating RN: [name] Primary Care Anise Harbin: [name] Other Clinician: Referring Earl Losee: [name] Treating Lynnette Pote/Extender: [name], [name]: 26 Active Inactive Medication Nursing Diagnoses: Knowledge deficit related to medication safety: actual or potential Goals: Patient/caregiver will demonstrate understanding of all current medications Date Initiated: 02/26/2019 Target Resolution Date: 02/26/2019 Goal Status: Active Interventions: Assess for medication contraindications each visit where new medications are prescribed Notes: Necrotic Tissue Nursing Diagnoses: Impaired tissue integrity related to necrotic/devitalized tissue Goals: Necrotic/devitalized tissue will be minimized in the wound bed Date Initiated: 02/26/2019 Target Resolution Date: 03/06/2019 Goal Status: Active Interventions: Assess patient pain level pre-, during and post procedure and prior to discharge Treatment Activities: Apply topical anesthetic as ordered : 02/26/2019 Notes: Pressure Nursing Diagnoses: Knowledge deficit related to causes and risk factors for pressure ulcer development Knowledge deficit related to management of pressures ulcers Goals: Patient will remain free from development of additional pressure ulcers Date Initiated: 02/26/2019 Target Resolution Date: 02/26/2019 Goal Status: Active Patient will remain free of pressure ulcers Date Initiated: 02/26/2019 Target Resolution Date: 02/26/2019 Goal Status: Active Patient/caregiver will verbalize understanding of pressure ulcer management Date Initiated: 02/26/2019 Target Resolution Date: 02/26/2019 Goal Status: Active Interventions: Assess: immobility, friction, shearing, incontinence upon admission and as needed Notes: MYFANWY, SIMKIN ([id number]) Wound/Skin  Impairment Nursing Diagnoses: Impaired tissue integrity Goals: Ulcer/skin breakdown will have a volume reduction of 30% by week 4 Date Initiated: 02/26/2019 Target Resolution Date: 03/29/2019 Goal Status: Active Interventions: Provide education on ulcer and skin care Treatment Activities: Topical wound management initiated : 02/26/2019 Notes: Electronic Signature(s) Signed: 08/29/2019 4:19:26 PM By: [name] Entered By: [name] on 08/29/2019 14:07:35 Melinda [name] ([id number]) -------------------------------------------------------------------------------- Pain Assessment Details Patient Name: Melinda [name] Date of Service: 08/29/2019 1:30 PM Medical Record Number: [id number] Patient Account Number: [id number] Date of Birth/Sex: Aug 07, 1955 (64 y.o. F) Treating RN: Army Melia Primary Care Baley Shands: [name] Other Clinician: Referring Marisa Hage: [name] Treating Judithann Villamar/Extender: [name], [name]: 26 Active Problems Location of Pain Severity and Description of Pain Patient Has Paino No Site Locations Pain Management and Medication Current Pain Management: Electronic Signature(s) Signed: 08/29/2019 3:34:13 PM By: Army Melia Entered By: Army Melia on 08/29/2019 13:45:48 Melinda [name] ([id number]) -------------------------------------------------------------------------------- Patient/Caregiver Education Details Patient Name: Melinda [name] Date of Service: 08/29/2019 1:30 PM Medical Record Number: [id number] Patient Account Number: [id number] Date of Birth/Gender: [birth year] (64 y.o. F) Treating RN: [name] Primary Care Physician: [name] Other Clinician: Referring Physician: Clide Deutscher  NG[facility] Treating Physician/Extender: [name], [name]: 26 Education Assessment Education Provided To: Patient Education Topics Provided Venous: Handouts: Other: wound care as ordered Methods:  Explain/Verbal Responses: State content correctly Electronic Signature(s) Signed: 08/29/2019 4:19:26 PM By: [name] Entered By: [name] on 08/29/2019 14:13:57 Melinda [name] ([id number]) -------------------------------------------------------------------------------- Wound Assessment Details Patient Name: Melinda [name] Date of Service: 08/29/2019 1:30 PM Medical Record Number: [id number] Patient Account Number: [id number] Date of Birth/Sex: 1955-12-18 (64 y.o. F) Treating RN: Army Melia Primary Care Tyianna Menefee: [name] Other Clinician: Referring Alaa Eyerman: [name] Treating Cosette Prindle/Extender: [name], [name]: 26 Wound Status Wound Number: 1 Primary Diabetic Wound/Ulcer of the Lower Extremity Etiology: Wound Location: Left, Plantar Metatarsal head first Wound Open Wounding Event: Gradually Appeared Status: Date Acquired: 11/30/2018 Comorbid Cataracts, Coronary Artery Disease, Hypertension, Weeks Of Treatment: 26 History: Myocardial Infarction, Peripheral Venous Disease, Type II Clustered Wound: No Diabetes, Osteoarthritis, Osteomyelitis, Neuropathy Photos Wound Measurements Length: (cm) 0.4 Width: (cm) 0.3 Depth: (cm) 0.2 Area: (cm) 0.094 Volume: (cm) 0.019 % Reduction in Area: 97% % Reduction in Volume: 98.8% Epithelialization: Small (1-33%) Wound Description Classification: Grade 2 F Wound Margin: Epibole S Exudate Amount: Medium Exudate Type: Serosanguineous Exudate Color: red, brown oul Odor After Cleansing: No lough/Fibrino No Wound Bed Granulation Amount: Large (67-100%) Exposed Structure Granulation Quality: Pink, Pale Fascia Exposed: No Necrotic Amount: None Present (0%) Fat Layer (Subcutaneous Tissue) Exposed: Yes Tendon Exposed: No Muscle Exposed: No Joint Exposed: No Bone Exposed: No Treatment Notes Wound #1 (Left, Plantar Metatarsal head first) Notes prisma, adaptic, Foam, conform to foot Electronic  Signature(s) CAMY, HASLIP ([id number]) Signed: 08/29/2019 3:34:13 PM By: Army Melia Entered By: Army Melia on 08/29/2019 13:47:32 Melinda [name] ([id number]) -------------------------------------------------------------------------------- Wound Assessment Details Patient Name: Melinda [name] Date of Service: 08/29/2019 1:30 PM Medical Record Number: [id number] Patient Account Number: [id number] Date of Birth/Sex: 11-17-1955 (64 y.o. F) Treating RN: [name] Primary Care Shadee Rathod: [name] Other Clinician: Referring Dazani Norby: [name] Treating Elishia Kaczorowski/Extender: [name], [name]: 26 Wound Status Wound Number: 4 Primary Diabetic Wound/Ulcer of the Lower Extremity Etiology: Wound Location: Right, Medial Foot Wound Healed - Epithelialized Wounding Event: Blister Status: Date Acquired: 08/07/2019 Comorbid Cataracts, Coronary Artery Disease, Hypertension, Weeks Of Treatment: 3 History: Myocardial Infarction, Peripheral Venous Disease, Type II Clustered Wound: No Diabetes, Osteoarthritis, Osteomyelitis, Neuropathy Photos Wound Measurements Length: (cm) 0 Width: (cm) 0 Depth: (cm) 0 Area: (cm) 0 Volume: (cm) 0 % Reduction in Area: 100% % Reduction in Volume: 100% Epithelialization: Small (1-33%) Wound Description Classification: Grade 2 Wound Margin: Flat and Intact Exudate Amount: Medium Exudate Type: Serosanguineous Exudate Color: red, brown Foul Odor After Cleansing: No Slough/Fibrino No Wound Bed Granulation Amount: Large (67-100%) Exposed Structure Granulation Quality: Red, Pink Fascia Exposed: No Necrotic Amount: None Present (0%) Fat Layer (Subcutaneous Tissue) Exposed: Yes Tendon Exposed: No Muscle Exposed: No Joint Exposed: No Bone Exposed: No Electronic Signature(s) Signed: 08/29/2019 4:19:26 PM By: [name] Entered By: [name] on 08/29/2019 14:07:22 Melinda [name]  ([id number]) -------------------------------------------------------------------------------- Vitals Details Patient Name: Melinda [name] Date of Service: 08/29/2019 1:30 PM Medical Record Number: [id number] Patient Account Number: [id number] Date of Birth/Sex: 05/10/1955 (64 y.o. F) Treating RN: [name] Primary Care Catheline Hixon: [name] Other Clinician: Referring Weslyn Holsonback: [name] Treating Vanellope Passmore/Extender: [name], [name]: 26 Vital Signs Time Taken: 13:40 Temperature (F): 99.0 Height (in): 68 Pulse (bpm): 91 Weight (lbs): 300 Respiratory Rate (breaths/min): 18 Body Mass  Index (BMI): 45.6 Blood Pressure (mmHg): 152/77 Reference Range: 80 - 120 mg / dl Electronic Signature(s) Signed: 08/29/2019 4:14:40 PM By: Lorine Bears RCP, RRT, CHT Entered By: Lorine Bears on 08/29/2019 13:42:48

## 2019-09-05 ENCOUNTER — Ambulatory Visit: Payer: Medicare HMO | Admitting: Physician Assistant

## 2019-09-05 ENCOUNTER — Ambulatory Visit: Admit: 2019-09-05 | Payer: Medicare HMO | Admitting: Gastroenterology

## 2019-09-05 SURGERY — COLONOSCOPY WITH PROPOFOL
Anesthesia: General

## 2019-09-12 ENCOUNTER — Ambulatory Visit: Payer: Medicare HMO | Admitting: Physician Assistant

## 2019-09-19 ENCOUNTER — Encounter: Payer: Medicare HMO | Attending: Internal Medicine | Admitting: Internal Medicine

## 2019-09-19 ENCOUNTER — Other Ambulatory Visit: Payer: Self-pay

## 2019-09-19 DIAGNOSIS — E1142 Type 2 diabetes mellitus with diabetic polyneuropathy: Secondary | ICD-10-CM | POA: Diagnosis not present

## 2019-09-19 DIAGNOSIS — L84 Corns and callosities: Secondary | ICD-10-CM | POA: Diagnosis not present

## 2019-09-19 DIAGNOSIS — Z89422 Acquired absence of other left toe(s): Secondary | ICD-10-CM | POA: Diagnosis not present

## 2019-09-19 DIAGNOSIS — Z794 Long term (current) use of insulin: Secondary | ICD-10-CM | POA: Diagnosis not present

## 2019-09-19 DIAGNOSIS — L97511 Non-pressure chronic ulcer of other part of right foot limited to breakdown of skin: Secondary | ICD-10-CM | POA: Insufficient documentation

## 2019-09-19 DIAGNOSIS — L97522 Non-pressure chronic ulcer of other part of left foot with fat layer exposed: Secondary | ICD-10-CM | POA: Insufficient documentation

## 2019-09-19 DIAGNOSIS — I10 Essential (primary) hypertension: Secondary | ICD-10-CM | POA: Diagnosis not present

## 2019-09-19 DIAGNOSIS — I252 Old myocardial infarction: Secondary | ICD-10-CM | POA: Insufficient documentation

## 2019-09-19 DIAGNOSIS — E1151 Type 2 diabetes mellitus with diabetic peripheral angiopathy without gangrene: Secondary | ICD-10-CM | POA: Insufficient documentation

## 2019-09-19 DIAGNOSIS — I2510 Atherosclerotic heart disease of native coronary artery without angina pectoris: Secondary | ICD-10-CM | POA: Diagnosis not present

## 2019-09-19 DIAGNOSIS — Z89411 Acquired absence of right great toe: Secondary | ICD-10-CM | POA: Diagnosis not present

## 2019-09-19 DIAGNOSIS — Z89421 Acquired absence of other right toe(s): Secondary | ICD-10-CM | POA: Diagnosis not present

## 2019-09-19 DIAGNOSIS — E11621 Type 2 diabetes mellitus with foot ulcer: Secondary | ICD-10-CM | POA: Insufficient documentation

## 2019-09-19 DIAGNOSIS — E669 Obesity, unspecified: Secondary | ICD-10-CM | POA: Diagnosis not present

## 2019-09-19 DIAGNOSIS — Z951 Presence of aortocoronary bypass graft: Secondary | ICD-10-CM | POA: Diagnosis not present

## 2019-09-19 DIAGNOSIS — M1990 Unspecified osteoarthritis, unspecified site: Secondary | ICD-10-CM | POA: Diagnosis not present

## 2019-09-19 NOTE — Progress Notes (Addendum)
AASTHA, GUTMAN ([id number]) Visit Report for 09/19/2019 Arrival Information Details Patient Name: Melinda [name], Melinda [name] Date of Service: 09/19/2019 10:15 AM Medical Record Number: [id number] Patient Account Number: [id number] Date of Birth/Sex: 01-29-1956 (64 y.o. F) Treating RN: Army Melia Primary Care Jac Romulus: [name] Other Clinician: Referring Aleigha Gilani: [name] Treating Dashayla Theissen/Extender: Tito Dine in Treatment: 57 Visit Information History Since Last Visit Added or deleted any medications: No Patient Arrived: Ambulatory Any new allergies or adverse reactions: No Arrival Time: 10:24 Had a fall or experienced change in No Accompanied By: self activities of daily living that may affect Transfer Assistance: None risk of falls: Patient Identification Verified: Yes Signs or symptoms of abuse/neglect since last visito No Patient Has Alerts: Yes Hospitalized since last visit: No Patient Alerts: Patient on Blood Thinner Has Dressing in Place as Prescribed: Yes Plavix, Aspirin 81mg  Pain Present Now: No ABI's L .88/R .71 Electronic Signature(s) Signed: 09/19/2019 11:08:48 AM By: Army Melia Entered By: Army Melia on 09/19/2019 10:24:27 Melinda [name] ([id number]) -------------------------------------------------------------------------------- Encounter Discharge Information Details Patient Name: Melinda [name] Date of Service: 09/19/2019 10:15 AM Medical Record Number: [id number] Patient Account Number: [id number] Date of Birth/Sex: 1955-08-06 (64 y.o. F) Treating RN: [name] Primary Care Alonza Knisley: [name] Other Clinician: Referring Maisee Vollman: [name] Treating Jaysen Wey/Extender: Tito Dine in Treatment: 54 Encounter Discharge Information Items Post Procedure Vitals Discharge Condition: Stable Temperature (F): 98.3 Ambulatory Status: Ambulatory Pulse (bpm): 96 Discharge Destination: Home Respiratory Rate (breaths/min):  16 Transportation: Private Auto Blood Pressure (mmHg): 125/49 Accompanied By: self Schedule Follow-up Appointment: Yes Clinical Summary of Care: Electronic Signature(s) Signed: 09/19/2019 1:36:47 PM By: [name] Entered By: [name] on 09/19/2019 11:32:24 Melinda [name] ([id number]) -------------------------------------------------------------------------------- Lower Extremity Assessment Details Patient Name: Melinda [name] Date of Service: 09/19/2019 10:15 AM Medical Record Number: [id number] Patient Account Number: [id number] Date of Birth/Sex: 1956-03-06 (64 y.o. F) Treating RN: Army Melia Primary Care Mukhtar Shams: [name] Other Clinician: Referring Liddie Chichester: [name] Treating Huxley Shurley/Extender: Ricard Dillon Weeks in Treatment: 29 Edema Assessment Assessed: [Left: No] [Right: No] Edema: [Left: N] [Right: o] Vascular Assessment Pulses: Dorsalis Pedis Palpable: [Left:Yes] Electronic Signature(s) Signed: 09/19/2019 11:08:48 AM By: Army Melia Entered By: Army Melia on 09/19/2019 10:27:55 Melinda [name] ([id number]) -------------------------------------------------------------------------------- Multi Wound Chart Details Patient Name: Melinda [name] Date of Service: 09/19/2019 10:15 AM Medical Record Number: [id number] Patient Account Number: [id number] Date of Birth/Sex: 27-Nov-1955 (63 y.o. F) Treating RN: [name] Primary Care Soua Caltagirone: [name] Other Clinician: Referring Story Vanvranken: [name] Treating Jaxson Anglin/Extender: Tito Dine in Treatment: 29 Vital Signs Height(in): 68 Pulse(bpm): 96 Weight(lbs): 300 Blood Pressure(mmHg): 125/49 Body Mass Index(BMI): 46 Temperature(F): 98.3 Respiratory Rate(breaths/min): 16 Photos: [N/A:N/A] Wound Location: Left, Plantar Metatarsal head first N/A N/A Wounding Event: Gradually Appeared N/A N/A Primary Etiology: Diabetic Wound/Ulcer of the Lower N/A  N/A Extremity Comorbid History: Cataracts, Coronary Artery Disease, N/A N/A Hypertension, Myocardial Infarction, Peripheral Venous Disease, Type II Diabetes, Osteoarthritis, Osteomyelitis, Neuropathy Date Acquired: 11/30/2018 N/A N/A Weeks of Treatment: 29 N/A N/A Wound Status: Open N/A N/A Measurements L x W x D (cm) 0.4x0.4x0.3 N/A N/A Area (cm) : 0.126 N/A N/A Volume (cm) : 0.038 N/A N/A % Reduction in Area: 96.00% N/A N/A % Reduction in Volume: 97.60% N/A N/A Classification: Grade 2 N/A N/A Exudate Amount: Medium N/A N/A Exudate Type: Serosanguineous N/A N/A Exudate Color: red, brown N/A N/A Wound Margin: Epibole N/A N/A Granulation Amount: Large (67-100%) N/A N/A Granulation Quality: Pink,  Pale N/A N/A Necrotic Amount: None Present (0%) N/A N/A Exposed Structures: Fat Layer (Subcutaneous Tissue) N/A N/A Exposed: Yes Fascia: No Tendon: No Muscle: No Joint: No Bone: No Epithelialization: Small (1-33%) N/A N/A Debridement: Debridement - Excisional N/A N/A Pre-procedure Verification/Time 11:25 N/A N/A Out Taken: Pain Control: Lidocaine 4% Topical Solution N/A N/A Tissue Debrided: Callus, Subcutaneous N/A N/A Level: Skin/Subcutaneous Tissue N/A N/A Debridement Area (sq cm): 0.16 N/A N/A Instrument: Curette N/A N/A Bleeding: Minimum N/A N/A Hemostasis Achieved: Pressure N/A N/A Melinda [name], Melinda [name] ([id number]) Procedural Pain: 0 N/A N/A Post Procedural Pain: 0 N/A N/A Debridement Treatment Procedure was tolerated well N/A N/A Response: Post Debridement 0.5x0.6x0.2 N/A N/A Measurements L x W x D (cm) Post Debridement Volume: 0.047 N/A N/A (cm) Procedures Performed: Debridement N/A N/A Treatment Notes Wound #1 (Left, Plantar Metatarsal head first) Notes prisma, adaptic, Foam, conform to foot Electronic Signature(s) Signed: 09/19/2019 12:54:43 PM By: Linton Ham MD Entered By: Linton Ham on 09/19/2019 [place], [place].  ([id number]) -------------------------------------------------------------------------------- Multi-Disciplinary Care Plan Details Patient Name: Melinda [name] Date of Service: 09/19/2019 10:15 AM Medical Record Number: [id number] Patient Account Number: [id number] Date of Birth/Sex: 02-Mar-1956 (64 y.o. F) Treating RN: [name] Primary Care Keriann Rankin: [name] Other Clinician: Referring Jessicca Stitzer: [name] Treating Andros Channing/Extender: Tito Dine in Treatment: 29 Active Inactive Medication Nursing Diagnoses: Knowledge deficit related to medication safety: actual or potential Goals: Patient/caregiver will demonstrate understanding of all current medications Date Initiated: 02/26/2019 Target Resolution Date: 02/26/2019 Goal Status: Active Interventions: Assess for medication contraindications each visit where new medications are prescribed Notes: Necrotic Tissue Nursing Diagnoses: Impaired tissue integrity related to necrotic/devitalized tissue Goals: Necrotic/devitalized tissue will be minimized in the wound bed Date Initiated: 02/26/2019 Target Resolution Date: 03/06/2019 Goal Status: Active Interventions: Assess patient pain level pre-, during and post procedure and prior to discharge Treatment Activities: Apply topical anesthetic as ordered : 02/26/2019 Notes: Pressure Nursing Diagnoses: Knowledge deficit related to causes and risk factors for pressure ulcer development Knowledge deficit related to management of pressures ulcers Goals: Patient will remain free from development of additional pressure ulcers Date Initiated: 02/26/2019 Target Resolution Date: 02/26/2019 Goal Status: Active Patient will remain free of pressure ulcers Date Initiated: 02/26/2019 Target Resolution Date: 02/26/2019 Goal Status: Active Patient/caregiver will verbalize understanding of pressure ulcer management Date Initiated: 02/26/2019 Target Resolution Date:  02/26/2019 Goal Status: Active Interventions: Assess: immobility, friction, shearing, incontinence upon admission and as needed Notes: Melinda [name], Melinda [name] ([id number]) Wound/Skin Impairment Nursing Diagnoses: Impaired tissue integrity Goals: Ulcer/skin breakdown will have a volume reduction of 30% by week 4 Date Initiated: 02/26/2019 Target Resolution Date: 03/29/2019 Goal Status: Active Interventions: Provide education on ulcer and skin care Treatment Activities: Topical wound management initiated : 02/26/2019 Notes: Electronic Signature(s) Signed: 09/19/2019 1:36:47 PM By: [name] Entered By: [name] on 09/19/2019 11:26:30 Melinda [name] ([id number]) -------------------------------------------------------------------------------- Pain Assessment Details Patient Name: Melinda [name] Date of Service: 09/19/2019 10:15 AM Medical Record Number: [id number] Patient Account Number: [id number] Date of Birth/Sex: 1956/01/07 (64 y.o. F) Treating RN: Army Melia Primary Care Prabhjot Maddux: [name] Other Clinician: Referring Teresa Lemmerman: [name] Treating Zahi Plaskett/Extender: Tito Dine in Treatment: 29 Active Problems Location of Pain Severity and Description of Pain Patient Has Paino No Site Locations Pain Management and Medication Current Pain Management: Electronic Signature(s) Signed: 09/19/2019 11:08:48 AM By: Army Melia Entered By: Army Melia on 09/19/2019 10:25:02 Melinda [name] ([id number]) -------------------------------------------------------------------------------- Patient/Caregiver Education Details Patient Name: Melinda [name] Date of  Service: 09/19/2019 10:15 AM Medical Record Number: [id number] Patient Account Number: [id number] Date of Birth/Gender: [birth year] (64 y.o. F) Treating RN: [name] Primary Care Physician: [name] Other Clinician: Referring Physician: [name] Treating Physician/Extender: Tito Dine in Treatment: 48 Education Assessment Education Provided To: Patient Education Topics Provided Wound/Skin Impairment: Handouts: Other: wound care as ordered Methods: Explain/Verbal Responses: State content correctly Electronic Signature(s) Signed: 09/19/2019 1:36:47 PM By: [name] Entered By: [name] on 09/19/2019 11:31:39 Melinda [name] ([id number]) -------------------------------------------------------------------------------- Wound Assessment Details Patient Name: Melinda [name] Date of Service: 09/19/2019 10:15 AM Medical Record Number: [id number] Patient Account Number: [id number] Date of Birth/Sex: November 12, 1955 (64 y.o. F) Treating RN: Army Melia Primary Care Hridaan Bouse: [name] Other Clinician: Referring Ariell Gunnels: [name] Treating Avanti Jetter/Extender: Tito Dine in Treatment: 29 Wound Status Wound Number: 1 Primary Diabetic Wound/Ulcer of the Lower Extremity Etiology: Wound Location: Left, Plantar Metatarsal head first Wound Open Wounding Event: Gradually Appeared Status: Date Acquired: 11/30/2018 Comorbid Cataracts, Coronary Artery Disease, Hypertension, Weeks Of Treatment: 29 History: Myocardial Infarction, Peripheral Venous Disease, Type II Clustered Wound: No Diabetes, Osteoarthritis, Osteomyelitis, Neuropathy Photos Wound Measurements Length: (cm) 0.4 Width: (cm) 0.4 Depth: (cm) 0.3 Area: (cm) 0.126 Volume: (cm) 0.038 % Reduction in Area: 96% % Reduction in Volume: 97.6% Epithelialization: Small (1-33%) Wound Description Classification: Grade 2 Wound Margin: Epibole Exudate Amount: Medium Exudate Type: Serosanguineous Exudate Color: red, brown Foul Odor After Cleansing: No Slough/Fibrino No Wound Bed Granulation Amount: Large (67-100%) Exposed Structure Granulation Quality: Pink, Pale Fascia Exposed: No Necrotic Amount: None Present (0%) Fat Layer (Subcutaneous Tissue) Exposed: Yes Tendon Exposed:  No Muscle Exposed: No Joint Exposed: No Bone Exposed: No Treatment Notes Wound #1 (Left, Plantar Metatarsal head first) Notes prisma, adaptic, Foam, conform to foot Electronic Signature(s) Melinda [name], Melinda [name] ([id number]) Signed: 09/19/2019 11:08:48 AM By: Army Melia Entered By: Army Melia on 09/19/2019 10:27:41 Melinda [name] ([id number]) -------------------------------------------------------------------------------- Vitals Details Patient Name: Melinda [name] Date of Service: 09/19/2019 10:15 AM Medical Record Number: [id number] Patient Account Number: [id number] Date of Birth/Sex: March 12, 1956 (64 y.o. F) Treating RN: Army Melia Primary Care Jaymes Revels: [name] Other Clinician: Referring Orra Nolde: [name] Treating Pablo Mathurin/Extender: Tito Dine in Treatment: 29 Vital Signs Time Taken: 10:24 Temperature (F): 98.3 Height (in): 68 Pulse (bpm): 96 Weight (lbs): 300 Respiratory Rate (breaths/min): 16 Body Mass Index (BMI): 45.6 Blood Pressure (mmHg): 125/49 Reference Range: 80 - 120 mg / dl Electronic Signature(s) Signed: 09/19/2019 11:08:48 AM By: Army Melia Entered By: Army Melia on 09/19/2019 10:24:55
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
## 2019-10-01 ENCOUNTER — Telehealth: Payer: Self-pay | Admitting: Dietician

## 2019-10-01 NOTE — Telephone Encounter (Signed)
Returned message from patient checking on her status for having bariatric surgery. She communicated with a person at the surgery practice several weeks ago, but has not heard back again regarding any progress. Sent email to bariatric coordinator at [facility] to check into patient's status and communicate with patient.

## 2019-10-03 ENCOUNTER — Other Ambulatory Visit: Payer: Self-pay

## 2019-10-03 ENCOUNTER — Encounter: Payer: Medicare HMO | Attending: Physician Assistant | Admitting: Physician Assistant

## 2019-10-03 DIAGNOSIS — L84 Corns and callosities: Secondary | ICD-10-CM | POA: Diagnosis not present

## 2019-10-03 DIAGNOSIS — L97511 Non-pressure chronic ulcer of other part of right foot limited to breakdown of skin: Secondary | ICD-10-CM | POA: Diagnosis not present

## 2019-10-03 DIAGNOSIS — X58XXXA Exposure to other specified factors, initial encounter: Secondary | ICD-10-CM | POA: Diagnosis not present

## 2019-10-03 DIAGNOSIS — S81802A Unspecified open wound, left lower leg, initial encounter: Secondary | ICD-10-CM | POA: Insufficient documentation

## 2019-10-03 DIAGNOSIS — E669 Obesity, unspecified: Secondary | ICD-10-CM | POA: Diagnosis not present

## 2019-10-03 DIAGNOSIS — Z89421 Acquired absence of other right toe(s): Secondary | ICD-10-CM | POA: Diagnosis not present

## 2019-10-03 DIAGNOSIS — Z89411 Acquired absence of right great toe: Secondary | ICD-10-CM | POA: Insufficient documentation

## 2019-10-03 DIAGNOSIS — Z951 Presence of aortocoronary bypass graft: Secondary | ICD-10-CM | POA: Diagnosis not present

## 2019-10-03 DIAGNOSIS — E1142 Type 2 diabetes mellitus with diabetic polyneuropathy: Secondary | ICD-10-CM | POA: Diagnosis not present

## 2019-10-03 DIAGNOSIS — I252 Old myocardial infarction: Secondary | ICD-10-CM | POA: Insufficient documentation

## 2019-10-03 DIAGNOSIS — Z794 Long term (current) use of insulin: Secondary | ICD-10-CM | POA: Insufficient documentation

## 2019-10-03 DIAGNOSIS — Z89422 Acquired absence of other left toe(s): Secondary | ICD-10-CM | POA: Diagnosis not present

## 2019-10-03 DIAGNOSIS — L97522 Non-pressure chronic ulcer of other part of left foot with fat layer exposed: Secondary | ICD-10-CM | POA: Diagnosis not present

## 2019-10-03 DIAGNOSIS — I2510 Atherosclerotic heart disease of native coronary artery without angina pectoris: Secondary | ICD-10-CM | POA: Insufficient documentation

## 2019-10-03 DIAGNOSIS — E1151 Type 2 diabetes mellitus with diabetic peripheral angiopathy without gangrene: Secondary | ICD-10-CM | POA: Insufficient documentation

## 2019-10-03 DIAGNOSIS — E11621 Type 2 diabetes mellitus with foot ulcer: Secondary | ICD-10-CM | POA: Insufficient documentation

## 2019-10-03 DIAGNOSIS — I10 Essential (primary) hypertension: Secondary | ICD-10-CM | POA: Insufficient documentation
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
## 2019-10-07 ENCOUNTER — Other Ambulatory Visit: Payer: Self-pay

## 2019-10-07 ENCOUNTER — Ambulatory Visit: Payer: Medicare HMO | Admitting: Dermatology

## 2019-10-07 DIAGNOSIS — L689 Hypertrichosis, unspecified: Secondary | ICD-10-CM

## 2019-10-07 DIAGNOSIS — L853 Xerosis cutis: Secondary | ICD-10-CM

## 2019-10-07 DIAGNOSIS — L299 Pruritus, unspecified: Secondary | ICD-10-CM | POA: Diagnosis not present

## 2019-10-07 MED ORDER — CLOBETASOL PROPIONATE 0.05 % EX SOLN: CUTANEOUS | 0 refills | Status: DC

## 2019-10-07 MED ORDER — EFLORNITHINE HCL 13.9 % EX CREA: TOPICAL_CREAM | CUTANEOUS | 2 refills | Status: DC

## 2019-10-07 NOTE — Patient Instructions (Addendum)
Gentle Skin Care Guide  1. Bathe no more than once a day.  2. Avoid bathing in hot water  3. Use a mild soap like Dove, Vanicream, Cetaphil, CeraVe. Can use Lever 2000 or Cetaphil antibacterial soap  4. Use soap only where you need it. On most days, use it under your arms, between your legs, and on your feet. Let the water rinse other areas unless visibly dirty.  5. When you get out of the bath/shower, use a towel to gently blot your skin dry, don't rub it.  6. While your skin is still a little damp, apply a moisturizing cream such as Vanicream, CeraVe, Cetaphil, Eucerin, Sarna lotion or plain Vaseline Jelly. For hands apply Neutrogena [place] Hand Cream or Excipial Hand Cream.  7. Reapply moisturizer any time you start to itch or feel dry.  8. Sometimes using free and clear laundry detergents can be helpful. Fabric softener sheets should be avoided. Downy Free & Gentle liquid, or any liquid fabric softener that is free of dyes and perfumes, it acceptable to use  9. If your doctor has given you prescription creams you may apply moisturizers over them    Eczema Skin Care  Buy TWO 16oz jars of CeraVe moisturizing cream  [facility], [facility], [facility] (no prescription needed)  Costs about $15 per jar   Jar #1: Use as a moisturizer as needed. Can be applied to any area of the body. Use twice daily to unaffected areas.  Jar #2: Pour one 66ml bottle of clobetasol 0.05% solution into jar, mix well. Label this jar to indicate the medication has been added. Use twice daily to affected areas. Do not apply to face, groin or underarms.  Moisturizer may burn or sting initially. Try for at least 4 weeks.   Consider spironolactone 100mg  1-2 times daily if okay'ed by cardiologist.

## 2019-10-07 NOTE — Progress Notes (Signed)
   New Patient Visit  Subjective  Melinda [name] is a 64 y.o. female who presents for the following: Skin Problem.  Patient here today to talk about her dry skin. She is diabetic and uses over the counter lotion but is itchy all over. Currently she is using a hemp lotion. Patient also wants to discuss facial hair. She has to shave every day otherwise by 6-7 days she advises she will have a beard and mustache.   The following portions of the chart were reviewed this encounter and updated as appropriate:      Review of Systems:  No other skin or systemic complaints except as noted in HPI or Assessment and Plan.  Objective  Well appearing patient in no apparent distress; mood and affect are within normal limits.  A focused examination was performed including arms, face, legs, back. Relevant physical exam findings are noted in the Assessment and Plan.  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 Advised patient that we will get back with her with a referral recommendation. Start Vaniqa twice a day to chin, upper lip and neck.  Discussed spironolactone 100 mg PO qd/bid. She would need to get it approved by her cardiologist first since she is already on multiple meds for HTN including diuretics. Spironolactone can cause increased urination and cause blood pressure to decrease. Please watch for signs of lightheadedness and be cautious when changing position. It can sometimes cause breast tenderness or an irregular period in premenopausal women. It can also increase potassium. The increase in potassium usually is not a concern unless you are taking other medicines that also  increase potassium, so please be sure your doctor knows all of the other medications you are taking. This medication should not be taken  by pregnant women.   Ordered Medications: Eflornithine HCl 13.9 % cream  Xerosis cutis Arms, legs, back  With pruritus.  Discontinue Dial soap and start Dove for Sensitive Skin. Start CeraVe/clobetasol cream mix up to twice a day for a month. Avoid face, groin, underarms.  Once improved, discontinue and only use as needed. Change to plain CeraVe cream.  Recommend mild soap and moisturizing cream 1-2 times daily.      Ordered Medications: clobetasol (TEMOVATE) 0.05 % external solution  Return 4-6 weeks, for xerosis, hypertrichosis.  Graciella Belton, RMA, am acting as scribe for Brendolyn Patty, MD .  Documentation: I have reviewed the above documentation for accuracy and completeness, and I agree with the above.  Brendolyn Patty MD

## 2019-10-08 ENCOUNTER — Other Ambulatory Visit: Payer: Self-pay | Admitting: Cardiovascular Disease

## 2019-10-08 DIAGNOSIS — I2510 Atherosclerotic heart disease of native coronary artery without angina pectoris: Secondary | ICD-10-CM

## 2019-10-08 NOTE — Progress Notes (Signed)
ZAKIA, SAINATO ([id number]) Visit Report for 10/03/2019 Arrival Information Details Patient Name: Melinda [name], Melinda [name] Date of Service: 10/03/2019 10:15 AM Medical Record Number: [id number] Patient Account Number: [id number] Date of Birth/Sex: Mar 23, 1956 (64 y.o. F) Treating RN: [name] Primary Care Samone Guhl: [name] Other Clinician: Referring Renate Danh: [name] Treating Lacara Dunsworth/Extender: [name], [name]: 80 Visit Information History Since Last Visit Added or deleted any medications: No Patient Arrived: Wheel Chair Has Dressing in Place as Prescribed: Yes Arrival Time: 09:35 Has Footwear/Offloading in Place as Prescribed: Yes Accompanied By: self Left: Wedge Shoe Transfer Assistance: None Pain Present Now: No Patient Identification Verified: Yes Secondary Verification Process Completed: Yes Patient Has Alerts: Yes Patient Alerts: Patient on Blood Thinner Plavix, Aspirin 81mg  ABI's L .88/R .71 Notes Wedge shoe is causing patient to have hip pain. Electronic Signature(s) Signed: 10/08/2019 7:21:37 AM By: [name], [name], RN, [name], [name] RN, [name] Entered By: [name], [name], RN, [name], [name] on 10/03/2019 09:46:54 Melinda [name] ([id number]) -------------------------------------------------------------------------------- Encounter Discharge Information Details Patient Name: Melinda [name], Melinda [name] Date of Service: 10/03/2019 10:15 AM Medical Record Number: [id number] Patient Account Number: [id number] Date of Birth/Sex: 01/30/1956 (64 y.o. F) Treating RN: [name] Primary Care Ruthellen Tippy: [name] Other Clinician: Referring Rodriguez Aguinaldo: [name] Treating Kiven Vangilder/Extender: [name], [name]: 31 Encounter Discharge Information Items Post Procedure Vitals Discharge Condition: Stable Temperature (F): 98.5 Ambulatory Status: Ambulatory Pulse (bpm): 82 Discharge Destination: Home Respiratory Rate (breaths/min): 16 Transportation: Private Auto Blood  Pressure (mmHg): 139/55 Accompanied By: self Schedule Follow-up Appointment: Yes Clinical Summary of Care: Electronic Signature(s) Signed: 10/03/2019 4:32:38 PM By: [name] Entered By: [name] on 10/03/2019 10:29:01 Melinda [name] ([id number]) -------------------------------------------------------------------------------- Lower Extremity Assessment Details Patient Name: Melinda [name] Date of Service: 10/03/2019 10:15 AM Medical Record Number: [id number] Patient Account Number: [id number] Date of Birth/Sex: 1956/03/06 (64 y.o. F) Treating RN: [name] Primary Care Tavari Loadholt: [name] Other Clinician: Referring Floria Brandau: [name] Treating Mckynzi Cammon/Extender: [name], [name]: 31 Edema Assessment Assessed: [Left: Yes] [Right: No] Edema: [Left: N] [Right: o] Vascular Assessment Pulses: Dorsalis Pedis Palpable: [Left:Yes] Electronic Signature(s) Signed: 10/08/2019 7:21:37 AM By: [name], [name], RN, [name], [name] RN, [name] Entered By: [name], [name], RN, [name], [name] on 10/03/2019 09:46:04 Melinda [name] ([id number]) -------------------------------------------------------------------------------- Multi Wound Chart Details Patient Name: Melinda [name] Date of Service: 10/03/2019 10:15 AM Medical Record Number: [id number] Patient Account Number: [id number] Date of Birth/Sex: Nov 30, 1955 (64 y.o. F) Treating RN: [name] Primary Care Eliani Leclere: [name] Other Clinician: Referring Starasia Sinko: [name] Treating Letonya Mangels/Extender: [name], [name]: 31 Vital Signs Height(in): 68 Pulse(bpm): 82 Weight(lbs): 300 Blood Pressure(mmHg): 139/55 Body Mass Index(BMI): 46 Temperature(F): 98.5 Respiratory Rate(breaths/min): 16 Photos: [N/A:N/A] Wound Location: Left, Plantar Metatarsal head first N/A N/A Wounding Event: Gradually Appeared N/A N/A Primary Etiology: Diabetic Wound/Ulcer of the Lower N/A N/A Extremity Comorbid History:  Cataracts, Coronary Artery Disease, N/A N/A Hypertension, Myocardial Infarction, Peripheral Venous Disease, Type II Diabetes, Osteoarthritis, Osteomyelitis, Neuropathy Date Acquired: 11/30/2018 N/A N/A Weeks of Treatment: 31 N/A N/A Wound Status: Open N/A N/A Measurements L x W x D (cm) 0.2x0.2x0.3 N/A N/A Area (cm) : 0.031 N/A N/A Volume (cm) : 0.009 N/A N/A % Reduction in Area: 99.00% N/A N/A % Reduction in Volume: 99.40% N/A N/A Starting Position 1 (o'clock): 12 Ending Position 1 (o'clock): 12 Maximum Distance 1 (cm): 0.2 Undermining: Yes N/A N/A Classification: Grade 2 N/A N/A Exudate Amount: Medium N/A N/A Exudate Type: Serosanguineous  N/A N/A Exudate Color: red, brown N/A N/A Wound Margin: Well defined, not attached N/A N/A Granulation Amount: Large (67-100%) N/A N/A Granulation Quality: Pink, Pale N/A N/A Necrotic Amount: None Present (0%) N/A N/A Exposed Structures: Fat Layer (Subcutaneous Tissue) N/A N/A Exposed: Yes Fascia: No Tendon: No Muscle: No Joint: No Bone: No Epithelialization: Small (1-33%) N/A N/A Treatment Notes Melinda [name], Melinda [name] ([id number]) Electronic Signature(s) Signed: 10/03/2019 4:32:38 PM By: [name] Entered By: [name] on 10/03/2019 10:20:03 Melinda [name] ([id number]) -------------------------------------------------------------------------------- [place] Plan Details Patient Name: Melinda [name] Date of Service: 10/03/2019 10:15 AM Medical Record Number: [id number] Patient Account Number: [id number] Date of Birth/Sex: 10-03-1955 (64 y.o. F) Treating RN: [name] Primary Care Adrin Julian: [name] Other Clinician: Referring Chandani Rogowski: [name] Treating Austan Nicholl/Extender: [name], [name]: 31 Active Inactive Medication Nursing Diagnoses: Knowledge deficit related to medication safety: actual or potential Goals: Patient/caregiver will demonstrate understanding of all current  medications Date Initiated: 02/26/2019 Target Resolution Date: 02/26/2019 Goal Status: Active Interventions: Assess for medication contraindications each visit where new medications are prescribed Notes: Necrotic Tissue Nursing Diagnoses: Impaired tissue integrity related to necrotic/devitalized tissue Goals: Necrotic/devitalized tissue will be minimized in the wound bed Date Initiated: 02/26/2019 Target Resolution Date: 03/06/2019 Goal Status: Active Interventions: Assess patient pain level pre-, during and post procedure and prior to discharge Treatment Activities: Apply topical anesthetic as ordered : 02/26/2019 Notes: Pressure Nursing Diagnoses: Knowledge deficit related to causes and risk factors for pressure ulcer development Knowledge deficit related to management of pressures ulcers Goals: Patient will remain free from development of additional pressure ulcers Date Initiated: 02/26/2019 Target Resolution Date: 02/26/2019 Goal Status: Active Patient will remain free of pressure ulcers Date Initiated: 02/26/2019 Target Resolution Date: 02/26/2019 Goal Status: Active Patient/caregiver will verbalize understanding of pressure ulcer management Date Initiated: 02/26/2019 Target Resolution Date: 02/26/2019 Goal Status: Active Interventions: Assess: immobility, friction, shearing, incontinence upon admission and as needed Notes: Melinda [name], Melinda [name] ([id number]) Wound/Skin Impairment Nursing Diagnoses: Impaired tissue integrity Goals: Ulcer/skin breakdown will have a volume reduction of 30% by week 4 Date Initiated: 02/26/2019 Target Resolution Date: 03/29/2019 Goal Status: Active Interventions: Provide education on ulcer and skin care Treatment Activities: Topical wound management initiated : 02/26/2019 Notes: Electronic Signature(s) Signed: 10/03/2019 4:32:38 PM By: [name] Entered By: [name] on 10/03/2019 10:19:55 Melinda [name]  ([id number]) -------------------------------------------------------------------------------- Pain Assessment Details Patient Name: Melinda [name] Date of Service: 10/03/2019 10:15 AM Medical Record Number: [id number] Patient Account Number: [id number] Date of Birth/Sex: [birth year] (64 y.o. F) Treating RN: [name] Primary Care Shanvi Moyd: [name] Other Clinician: Referring Deneene Tarver: [name] Treating Harjit Douds/Extender: [name], [name]: 31 Active Problems Location of Pain Severity and Description of Pain Patient Has Paino No Site Locations Pain Management and Medication Current Pain Management: Notes Pain in hip from off-loading shoe. Electronic Signature(s) Signed: 10/08/2019 7:21:37 AM By: [name], [name], RN, [name], [name] RN, [name] Entered By: [name], [name], RN, [name], [name] on 10/03/2019 09:40:30 Melinda [name] ([id number]) -------------------------------------------------------------------------------- Patient/Caregiver Education Details Patient Name: Melinda [name] Date of Service: 10/03/2019 10:15 AM Medical Record Number: [id number] Patient Account Number: [id number] Date of Birth/Gender: [birth year] (64 y.o. F) Treating RN: [name] Primary Care Physician: [name] Other Clinician: Referring Physician: [name] Treating Physician/Extender: Sharalyn Ink in Treatment: 31 Education Assessment Education Provided To: Patient Education Topics Provided Offloading: Handouts: Other: offoading wound Methods: Explain/Verbal Responses: State content correctly Electronic Signature(s) Signed: 10/03/2019 4:32:38 PM By: Marjory Lies,  Di Kindle Entered By: [name] on 10/03/2019 10:28:19 Melinda [name] ([id number]) -------------------------------------------------------------------------------- Wound Assessment Details Patient Name: Melinda [name] Date of Service: 10/03/2019 10:15 AM Medical Record Number: [id number] Patient Account Number:  [id number] Date of Birth/Sex: 1956-02-04 (64 y.o. F) Treating RN: [name] Primary Care Jacorie Ernsberger: [name] Other Clinician: Referring Makennah Omura: [name] Treating Saray Capasso/Extender: [name], [name]: 31 Wound Status Wound Number: 1 Primary Diabetic Wound/Ulcer of the Lower Extremity Etiology: Wound Location: Left, Plantar Metatarsal head first Wound Open Wounding Event: Gradually Appeared Status: Date Acquired: 11/30/2018 Comorbid Cataracts, Coronary Artery Disease, Hypertension, Weeks Of Treatment: 31 History: Myocardial Infarction, Peripheral Venous Disease, Type II Clustered Wound: No Diabetes, Osteoarthritis, Osteomyelitis, Neuropathy Photos Wound Measurements Length: (cm) 0.2 % Width: (cm) 0.2 % Depth: (cm) 0.3 Ep Area: (cm) 0.031 T Volume: (cm) 0.009 U Reduction in Area: 99% Reduction in Volume: 99.4% ithelialization: Small (1-33%) unneling: No ndermining: Yes Starting Position (o'clock): 12 Ending Position (o'clock): 12 Maximum Distance: (cm) 0.2 Wound Description Classification: Grade 2 F Wound Margin: Well defined, not attached S Exudate Amount: Medium Exudate Type: Serosanguineous Exudate Color: red, brown oul Odor After Cleansing: No lough/Fibrino No Wound Bed Granulation Amount: Large (67-100%) Exposed Structure Granulation Quality: Pink, Pale Fascia Exposed: No Necrotic Amount: None Present (0%) Fat Layer (Subcutaneous Tissue) Exposed: Yes Tendon Exposed: No Muscle Exposed: No Joint Exposed: No Bone Exposed: No Treatment Notes Wound #1 (Left, Plantar Metatarsal head first) Melinda [name], Melinda [name] ([id number]) Notes prisma, adaptic, Foam, conform to foot Electronic Signature(s) Signed: 10/08/2019 7:21:37 AM By: [name], [name], RN, [name], [name] RN, [name] Entered By: [name], [name], RN, [name], [name] on 10/03/2019 09:44:52 Melinda [name] ([id number]) -------------------------------------------------------------------------------- Vitals  Details Patient Name: Melinda [name] Date of Service: 10/03/2019 10:15 AM Medical Record Number: [id number] Patient Account Number: [id number] Date of Birth/Sex: Sep 22, 1955 (64 y.o. F) Treating RN: [name] Primary Care Berlyn Malina: [name] Other Clinician: Referring Ritamarie Arkin: [name] Treating Ashlon Lottman/Extender: [name], [name]: 31 Vital Signs Time Taken: 09:39 Temperature (F): 98.5 Height (in): 68 Pulse (bpm): 82 Weight (lbs): 300 Respiratory Rate (breaths/min): 16 Body Mass Index (BMI): 45.6 Blood Pressure (mmHg): 139/55 Reference Range: 80 - 120 mg / dl Electronic Signature(s) Signed: 10/08/2019 7:21:37 AM By: [name], [name], RN, [name], [name] RN, [name] Entered By: [name], [name], RN, [name], [name] on 10/03/2019 09:39:40

## 2019-10-10 ENCOUNTER — Other Ambulatory Visit: Payer: Self-pay

## 2019-10-10 ENCOUNTER — Encounter: Payer: Medicare HMO | Admitting: Physician Assistant

## 2019-10-10 DIAGNOSIS — E11621 Type 2 diabetes mellitus with foot ulcer: Secondary | ICD-10-CM | POA: Diagnosis not present
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
## 2019-10-10 NOTE — Progress Notes (Signed)
Melinda [name], Melinda [name] ([id number]) Visit Report for 10/10/2019 Arrival Information Details Patient Name: Melinda [name] Date of Service: 10/10/2019 10:15 AM Medical Record Number: [id number] Patient Account Number: [id number] Date of Birth/Sex: 1955/08/09 (64 y.o. F) Treating RN: Army Melia Primary Care Vanecia Limpert: [name] Other Clinician: Referring Kawan Valladolid: [name] Treating Starlina Lapre/Extender: [name], [name]: 67 Visit Information History Since Last Visit Added or deleted any medications: No Patient Arrived: Ambulatory Any new allergies or adverse reactions: No Arrival Time: 10:09 Had a fall or experienced change in No Accompanied By: self activities of daily living that may affect Transfer Assistance: None risk of falls: Patient Identification Verified: Yes Signs or symptoms of abuse/neglect since last visito No Patient Has Alerts: Yes Hospitalized since last visit: No Patient Alerts: Patient on Blood Thinner Has Dressing in Place as Prescribed: Yes Plavix, Aspirin 81mg  Pain Present Now: No ABI's L .88/R .71 Electronic Signature(s) Signed: 10/10/2019 3:19:07 PM By: Army Melia Entered By: Army Melia on 10/10/2019 10:09:28 Melinda [name] ([id number]) -------------------------------------------------------------------------------- Lower Extremity Assessment Details Patient Name: Melinda [name] Date of Service: 10/10/2019 10:15 AM Medical Record Number: [id number] Patient Account Number: [id number] Date of Birth/Sex: 1955/09/05 (64 y.o. F) Treating RN: Army Melia Primary Care Tyberius Ryner: [name] Other Clinician: Referring Jalah Warmuth: [name] Treating Kyser Wandel/Extender: [name], [name]: 32 Edema Assessment Assessed: [Left: No] [Right: No] Edema: [Left: N] [Right: o] Vascular Assessment Pulses: Dorsalis Pedis Palpable: [Left:Yes] Electronic Signature(s) Signed: 10/10/2019 3:19:07 PM By: Army Melia Entered By:  Army Melia on 10/10/2019 10:12:32 Melinda [name] ([id number]) -------------------------------------------------------------------------------- Multi Wound Chart Details Patient Name: Melinda [name] Date of Service: 10/10/2019 10:15 AM Medical Record Number: [id number] Patient Account Number: [id number] Date of Birth/Sex: 1955/09/03 (64 y.o. F) Treating RN: [name] Primary Care Damico Partin: [name] Other Clinician: Referring Gaspare Netzel: [name] Treating Candyce Gambino/Extender: [name], [name]: 32 Vital Signs Height(in): 68 Pulse(bpm): 98 Weight(lbs): 300 Blood Pressure(mmHg): 132/60 Body Mass Index(BMI): 46 Temperature(F): 98.2 Respiratory Rate(breaths/min): 16 Photos: [N/A:N/A] Wound Location: Left, Plantar Metatarsal head first N/A N/A Wounding Event: Gradually Appeared N/A N/A Primary Etiology: Diabetic Wound/Ulcer of the Lower N/A N/A Extremity Comorbid History: Cataracts, Coronary Artery Disease, N/A N/A Hypertension, Myocardial Infarction, Peripheral Venous Disease, Type II Diabetes, Osteoarthritis, Osteomyelitis, Neuropathy Date Acquired: 11/30/2018 N/A N/A Weeks of Treatment: 32 N/A N/A Wound Status: Healed - Epithelialized N/A N/A Measurements L x W x D (cm) 0x0x0 N/A N/A Area (cm) : 0 N/A N/A Volume (cm) : 0 N/A N/A % Reduction in Area: 100.00% N/A N/A % Reduction in Volume: 100.00% N/A N/A Classification: Grade 2 N/A N/A Exudate Amount: None Present N/A N/A Wound Margin: Well defined, not attached N/A N/A Granulation Amount: None Present (0%) N/A N/A Necrotic Amount: None Present (0%) N/A N/A Exposed Structures: Fascia: No N/A N/A Fat Layer (Subcutaneous Tissue) Exposed: No Tendon: No Muscle: No Joint: No Bone: No Limited to Skin Breakdown Epithelialization: Large (67-100%) N/A N/A Treatment Notes Electronic Signature(s) Signed: 10/10/2019 3:58:18 PM By: [name] Entered By: [name] on 10/10/2019  10:21:07 Melinda [name] ([id number]) -------------------------------------------------------------------------------- [place] Details Patient Name: Melinda [name] Date of Service: 10/10/2019 10:15 AM Medical Record Number: [id number] Patient Account Number: [id number] Date of Birth/Sex: [birth year] (64 y.o. F) Treating RN: [name] Primary Care Davon Folta: [name] Other Clinician: Referring Hagop Mccollam: [name] Treating Isiaha Greenup/Extender: [name], [name]: 32 Active Inactive Electronic Signature(s) Signed: 10/10/2019 3:58:18 PM By: [name] Entered By:  [name] on 10/10/2019 10:20:13 Melinda [name], Melinda [name] ([id number]) -------------------------------------------------------------------------------- Pain Assessment Details Patient Name: Melinda [name], Melinda. Date of Service: 10/10/2019 10:15 AM Medical Record Number: [id number] Patient Account Number: [id number] Date of Birth/Sex: 1956/01/21 (64 y.o. F) Treating RN: Army Melia Primary Care Bebe Moncure: [name] Other Clinician: Referring Aliesha Dolata: [name] Treating Icyss Skog/Extender: [name], [name]: 32 Active Problems Location of Pain Severity and Description of Pain Patient Has Paino No Site Locations Pain Management and Medication Current Pain Management: Electronic Signature(s) Signed: 10/10/2019 3:19:07 PM By: Army Melia Entered By: Army Melia on 10/10/2019 10:09:44 Melinda [name] ([id number]) -------------------------------------------------------------------------------- Wound Assessment Details Patient Name: Melinda [name] Date of Service: 10/10/2019 10:15 AM Medical Record Number: [id number] Patient Account Number: [id number] Date of Birth/Sex: Dec 23, 1955 (64 y.o. F) Treating RN: Army Melia Primary Care Galadriel Shroff: [name] Other Clinician: Referring Amyre Segundo: [name] Treating Kazmir Oki/Extender: [name], [name] Weeks in  Treatment: 32 Wound Status Wound Number: 1 Primary Diabetic Wound/Ulcer of the Lower Extremity Etiology: Wound Location: Left, Plantar Metatarsal head first Wound Healed - Epithelialized Wounding Event: Gradually Appeared Status: Date Acquired: 11/30/2018 Comorbid Cataracts, Coronary Artery Disease, Hypertension, Weeks Of Treatment: 32 History: Myocardial Infarction, Peripheral Venous Disease, Type II Clustered Wound: No Diabetes, Osteoarthritis, Osteomyelitis, Neuropathy Photos Wound Measurements Length: (cm) 0 Width: (cm) 0 Depth: (cm) 0 Area: (cm) 0 Volume: (cm) 0 % Reduction in Area: 100% % Reduction in Volume: 100% Epithelialization: Large (67-100%) Wound Description Classification: Grade 2 Wound Margin: Well defined, not attached Exudate Amount: None Present Foul Odor After Cleansing: No Slough/Fibrino No Wound Bed Granulation Amount: None Present (0%) Exposed Structure Necrotic Amount: None Present (0%) Fascia Exposed: No Fat Layer (Subcutaneous Tissue) Exposed: No Tendon Exposed: No Muscle Exposed: No Joint Exposed: No Bone Exposed: No Limited to Skin Breakdown Electronic Signature(s) Signed: 10/10/2019 3:19:07 PM By: Army Melia Signed: 10/10/2019 3:58:18 PM By: [name] Entered By: [name] on 10/10/2019 10:19:53 Melinda [name] ([id number]) -------------------------------------------------------------------------------- Vitals Details Patient Name: Melinda [name] Date of Service: 10/10/2019 10:15 AM Medical Record Number: [id number] Patient Account Number: [id number] Date of Birth/Sex: Jan 09, 1956 (64 y.o. F) Treating RN: Army Melia Primary Care Alica Shellhammer: [name] Other Clinician: Referring Jaxn Chiquito: [name] Treating Dera Vanaken/Extender: [name], [name]: 32 Vital Signs Time Taken: 10:09 Temperature (F): 98.2 Height (in): 68 Pulse (bpm): 98 Weight (lbs): 300 Respiratory Rate (breaths/min): 16 Body Mass  Index (BMI): 45.6 Blood Pressure (mmHg): 132/60 Reference Range: 80 - 120 mg / dl Electronic Signature(s) Signed: 10/10/2019 3:19:07 PM By: Army Melia Entered By: Army Melia on 10/10/2019 10:09:40

## 2019-10-13 ENCOUNTER — Telehealth: Payer: Self-pay

## 2019-10-13 NOTE — Telephone Encounter (Signed)
-----   Message from Brendolyn Patty, MD sent at 10/13/2019  5:48 PM EDT ----- Regarding: FW: [facility] Could one of you contact patient and let her know that Sonia Baller can treat her down in [place]?  They have [facility] laser for dark skin.  This would be a cosmetic procedure and would require multiple visits.  I am not sure what they charge per visit, but she could set up a consult and get a quote. ----- Message ----- From: Nelly Laurence Sent: 10/13/2019   5:30 PM EDT To: Brendolyn Patty, MD Subject: RE: [facility]                                        I can treat her at [facility] [place] [facility]. [phone]) ----- Message ----- From: Brendolyn Patty, MD Sent: 10/07/2019   7:40 PM EDT To: Nelly Laurence Subject: [facility]                                            Where should I refer her to for [facility]?  She is AA Type 4/5 skin with extensive dark hair growth chin, upper neck, upper lip. Thanks!

## 2019-10-13 NOTE — Telephone Encounter (Signed)
Advised pt that Lind Covert our esthetician also works at [facility] [employer] [facility] [place]. [phone]) and they have a laser that works on her skin type for [facility].  Advised pt that this would be a cosmetic procedure and would require multiple visits.  She said she will call and set up an appointment for a consult./sh

## 2019-10-14 ENCOUNTER — Telehealth: Payer: Self-pay | Admitting: *Deleted

## 2019-10-14 NOTE — Telephone Encounter (Signed)
Called Christian back at [facility] drew had to lvm to call back and get pt scheduled either [place] or [place] office

## 2019-10-14 NOTE — Telephone Encounter (Signed)
[place] states pt has not been able to get in to see her PCP to get someone in to the home to change dressings, and he got [facility] in to pt home but needs someone to give orders. I reviewed pt's clinical of 07/22/2019 for debridement of toenails no wound care was given. I explained to [place] that we would need to see pt prior to [facility] orders because her health status has changed since 07/22/2019. I transferred [place] to C. Cole - Scheduler to get pt an appt in [place].

## 2019-10-23 ENCOUNTER — Ambulatory Visit: Payer: Medicare HMO | Admitting: Podiatry

## 2019-10-23 ENCOUNTER — Encounter: Payer: Self-pay | Admitting: Podiatry

## 2019-10-23 ENCOUNTER — Other Ambulatory Visit: Payer: Self-pay

## 2019-10-23 DIAGNOSIS — M79674 Pain in right toe(s): Secondary | ICD-10-CM

## 2019-10-23 DIAGNOSIS — M79675 Pain in left toe(s): Secondary | ICD-10-CM

## 2019-10-23 DIAGNOSIS — Z794 Long term (current) use of insulin: Secondary | ICD-10-CM

## 2019-10-23 DIAGNOSIS — E1159 Type 2 diabetes mellitus with other circulatory complications: Secondary | ICD-10-CM

## 2019-10-23 DIAGNOSIS — B351 Tinea unguium: Secondary | ICD-10-CM

## 2019-10-23 DIAGNOSIS — Z89429 Acquired absence of other toe(s), unspecified side: Secondary | ICD-10-CM

## 2019-10-23 NOTE — Progress Notes (Signed)
This patient returns to my office for at risk foot care.  This patient requires this care by a professional since this patient will be at risk due to having claudication with PVD chronic kidney disease and diabetes.    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  Patient has amputates toes  Both feet.  She says she has healed from ulcers under her big toe joint left foot and big toe  Right foot.  She says these have resolved.    This patient is unable to cut nails himself since the patient cannot reach his nails.These nails are painful walking and wearing shoes.  This patient presents for at risk foot care today.  General Appearance  Alert, conversant and in no acute stress.  Vascular  Dorsalis pedis and posterior tibial  pulses are weakly  palpable  bilaterally.  Capillary return is within normal limits  bilaterally. Temperature is within normal limits  bilaterally.  Neurologic  Senn-Weinstein monofilament wire test diminished   bilaterally. Muscle power within normal limits bilaterally.  Nails Thick disfigured discolored nails with subungual debris  3-5 left foot and 1,2,4,5 right foot.. No evidence of bacterial infection or drainage bilaterally.  Orthopedic  No limitations of motion  feet .  No crepitus or effusions noted.  No bony pathology or digital deformities noted. Amputation digits 1,2 left foot.  Amputation 3rd digit right foot.  Skin   Thickened hypertropic skin plantar first metatarsal left foot.  Callus medial aspect right hallux.    Onychomycosis  Pain in right toes  Pain in left toes  Consent was obtained for treatment procedures.   Mechanical debridement of nails 1-5  bilaterally performed with a nail nipper.  Filed with dremel without incident.  Any ulcer reoccurences patient is to e seen by Dr.  Posey Pronto.   Return office visit   3 months.                  Told patient to return for periodic foot care and evaluation due to potential at risk complications.   Gardiner Barefoot DPM

## 2019-10-27 ENCOUNTER — Other Ambulatory Visit (INDEPENDENT_AMBULATORY_CARE_PROVIDER_SITE_OTHER): Payer: Self-pay | Admitting: Vascular Surgery

## 2019-10-27 DIAGNOSIS — I739 Peripheral vascular disease, unspecified: Secondary | ICD-10-CM

## 2019-10-27 DIAGNOSIS — Z95820 Peripheral vascular angioplasty status with implants and grafts: Secondary | ICD-10-CM

## 2019-10-28 ENCOUNTER — Ambulatory Visit (INDEPENDENT_AMBULATORY_CARE_PROVIDER_SITE_OTHER): Payer: Medicare HMO | Admitting: Vascular Surgery

## 2019-10-28 ENCOUNTER — Ambulatory Visit (INDEPENDENT_AMBULATORY_CARE_PROVIDER_SITE_OTHER): Payer: Medicare HMO

## 2019-10-28 ENCOUNTER — Other Ambulatory Visit: Payer: Self-pay

## 2019-10-28 ENCOUNTER — Encounter (INDEPENDENT_AMBULATORY_CARE_PROVIDER_SITE_OTHER): Payer: Self-pay | Admitting: Vascular Surgery

## 2019-10-28 VITALS — BP 117/69 | HR 88 | Resp 16 | Wt 299.0 lb

## 2019-10-28 DIAGNOSIS — Z95820 Peripheral vascular angioplasty status with implants and grafts: Secondary | ICD-10-CM

## 2019-10-28 DIAGNOSIS — E785 Hyperlipidemia, unspecified: Secondary | ICD-10-CM | POA: Diagnosis not present

## 2019-10-28 DIAGNOSIS — E1159 Type 2 diabetes mellitus with other circulatory complications: Secondary | ICD-10-CM

## 2019-10-28 DIAGNOSIS — I7025 Atherosclerosis of native arteries of other extremities with ulceration: Secondary | ICD-10-CM

## 2019-10-28 DIAGNOSIS — I10 Essential (primary) hypertension: Secondary | ICD-10-CM | POA: Diagnosis not present

## 2019-10-28 DIAGNOSIS — I739 Peripheral vascular disease, unspecified: Secondary | ICD-10-CM

## 2019-10-28 DIAGNOSIS — Z794 Long term (current) use of insulin: Secondary | ICD-10-CM

## 2019-10-28 NOTE — Assessment & Plan Note (Signed)
ABIs today are slightly better at 0.6 on the right and 1.05 on the left.  Her digit pressures actually in the normal range on the right.  Her wounds have healed.  She is not having any significant pain.  This is encouraging.  I will plan to see her back in 4 to 6 months with noninvasive studies or sooner if problems develop in the interim.

## 2019-10-28 NOTE — Progress Notes (Signed)
MRN : [id number]  Melinda [name] is a 64 y.o. (Nov 27, 1955) female who presents with chief complaint of  Chief Complaint  Patient presents with  . Follow-up    ultrasound follow up  .  History of Present Illness: Patient returns today in follow up of her PAD.  She has undergone extensive bilateral revascularization on multiple times in the past.  She is currently doing well.  Her wounds have healed.  She is scheduled to come out of her postop shoes next week.  She is not having any significant claudication or rest pain symptoms currently. ABIs today are slightly better at 0.6 on the right and 1.05 on the left.  Her digit pressures actually in the normal range on the right.  Current Outpatient Medications  Medication Sig Dispense Refill  . amLODipine (NORVASC) 5 MG tablet     . aspirin 81 MG EC tablet Take 1 tablet (81 mg total) by mouth daily. 30 tablet 0  . atorvastatin (LIPITOR) 80 MG tablet TAKE 1 TABLET EVERY DAY  AT  6  PM 90 tablet 2  . clobetasol (TEMOVATE) 0.05 % external solution Patient to mix entire bottle with 1 jar of CeraVe and apply twice a day x 1 month then as needed. Avoid face, groin, underarms. 50 mL 0  . clopidogrel (PLAVIX) 75 MG tablet Take 1 tablet (75 mg total) by mouth daily. 90 tablet 3  . DULoxetine (CYMBALTA) 60 MG capsule     . Eflornithine HCl 13.9 % cream Apply to upper lip, chin and neck twice a day. 45 g 2  . furosemide (LASIX) 20 MG tablet TAKE 1 TABLET (20 MG TOTAL) BY MOUTH DAILY AS NEEDED. 90 tablet 2  . insulin glargine (LANTUS) 100 UNIT/ML injection Inject 0.6 mLs (60 Units total) into the skin 2 (two) times daily. (Patient taking differently: Inject 70 Units into the skin 2 (two) times daily. )    . lidocaine (LIDODERM) 5 %     . lisinopril (PRINIVIL,ZESTRIL) 5 MG tablet Take 1 tablet (5 mg total) by mouth daily. 30 tablet 0  . metoprolol tartrate (LOPRESSOR) 25 MG tablet TAKE 1/2 TABLET TWICE DAILY  (  BETA  BLOCKER  ) 90 tablet 1  .  oxybutynin (DITROPAN) 5 MG tablet Take 5 mg by mouth 2 (two) times daily.     . potassium chloride SA (KLOR-CON) 20 MEQ tablet TAKE 1 TABLET (20 MEQ TOTAL) BY MOUTH DAILY AS NEEDED. 90 tablet 3  . pregabalin (LYRICA) 100 MG capsule     . tiZANidine (ZANAFLEX) 4 MG tablet Take 4 mg by mouth 3 (three) times daily.    Marland Kitchen VICTOZA 18 MG/3ML SOPN     . insulin aspart (NOVOLOG) 100 UNIT/ML injection Inject 3 Units into the skin 3 (three) times daily with meals. (Patient taking differently: Inject 6 Units into the skin 3 (three) times daily with meals. ) 2.7 mL 0   No current facility-administered medications for this visit.    Past Medical History:  Diagnosis Date  . Anxiety   . Coronary artery disease    a. 06/2014 NSTEMI s/p CABG x 4 (LIMA to LAD, VG to Diag, VG to OM, VG to PDA).  . Diabetic neuropathy ([place])   . GERD (gastroesophageal reflux disease)   . HTN (hypertension)   . Hyperlipidemia LDL goal <70   . IDDM (insulin dependent diabetes mellitus)   . Ischemic cardiomyopathy    a. 06/2014 Echo: EF 40-45%, HK of entire  inferolateral and inferior myocardium c/w infarct of RCA/LCx, GR2DD, mild MR  . OA (osteoarthritis) of knee   . Obesity   . Osteoarthritis   . Osteomyelitis ([place])   . Peripheral neuropathy   . Peripheral vascular disease ([place])    a. 09/2016 Periph Angio: CTO R popliteal, CTO L prox/mid SFA->Med Rx; b. 05/2017 PTA: LSFA (Viabahn covered stent x 2), DEB to L Post Tibial; c. 06/2017 ABI: R 0.44, L 1.05.  . Tobacco abuse     Past Surgical History:  Procedure Laterality Date  . ABDOMINAL AORTOGRAM W/LOWER EXTREMITY N/A 10/27/2016   Procedure: Abdominal Aortogram w/Lower Extremity;  Surgeon: Nelva Bush, MD;  Location: [facility];  Service: Cardiovascular;  Laterality: N/A;  . AMPUTATION TOE Left 10/21/2015   Procedure: AMPUTATION TOE;  Surgeon: Sharlotte Alamo, DPM;  Location: [facility];  Service: Podiatry;  Laterality: Left;  . AMPUTATION TOE Left 05/15/2017   Procedure:  AMPUTATION LEFT GREAT TOE;  Surgeon: Sharlotte Alamo, DPM;  Location: [facility];  Service: Podiatry;  Laterality: Left;  . AORTIC VALVE REPLACEMENT (AVR)/CORONARY ARTERY BYPASS GRAFTING (CABG)    . BREAST BIOPSY    . CARDIAC CATHETERIZATION  06/2014   95% stenosis mLAD, occlusion ostial OM1, 70% stenosis LCx, 95% stenosis mRCA, EF 45%.  . CORONARY ARTERY BYPASS GRAFT N/A 06/08/2014   Procedure: CORONARY ARTERY BYPASS GRAFTING (CABG);  Surgeon: Grace Isaac, MD;  Location: [place];  Service: Open Heart Surgery;  Laterality: N/A;  Times 4 using left internal mammary artery to LAD artery and endoscopically harvested bilateral saphenous vein to Obtuse Marginal, Diagonal and Posterior Descending coronary arteries.  . CT ABD W & PELVIS WO CM  06/2014   nl liver, gallbladder, spleen, mild diverticular changes, no bowel wall inflammation, appendix nl, no hernia, no other sig abnormalities  . LOWER EXTREMITY ANGIOGRAPHY Left 05/23/2017   Procedure: LOWER EXTREMITY ANGIOGRAPHY;  Surgeon: Algernon Huxley, MD;  Location: [place] [facility];  Service: Cardiovascular;  Laterality: Left;  . LOWER EXTREMITY ANGIOGRAPHY Left 05/28/2017   Procedure: LOWER EXTREMITY ANGIOGRAPHY;  Surgeon: Algernon Huxley, MD;  Location: [facility];  Service: Cardiovascular;  Laterality: Left;  . LOWER EXTREMITY ANGIOGRAPHY Left 12/16/2018   Procedure: LOWER EXTREMITY ANGIOGRAPHY;  Surgeon: Algernon Huxley, MD;  Location: [place] [facility];  Service: Cardiovascular;  Laterality: Left;  . LOWER EXTREMITY ANGIOGRAPHY Right 12/23/2018   Procedure: LOWER EXTREMITY ANGIOGRAPHY;  Surgeon: Algernon Huxley, MD;  Location: [place];  Service: Cardiovascular;  Laterality: Right;  . TEE WITHOUT CARDIOVERSION N/A 06/08/2014   Procedure: TRANSESOPHAGEAL ECHOCARDIOGRAM (TEE);  Surgeon: Grace Isaac, MD;  Location: [place] [facility];  Service: Open Heart Surgery;  Laterality: N/A;  . TOE AMPUTATION Right 12/2011   rt middle toe  . US  ECHOCARDIOGRAPHY  06/2014   EF 50-55%, HK of inf/post/inferolat walls, Ao sclerosis     Social History   Tobacco Use  . Smoking status: Current Some Day Smoker    Packs/day: 0.75    Years: 40.00    Pack years: 30.00    Types: Cigarettes  . Smokeless tobacco: Never Used  Vaping Use  . Vaping Use: Never used  Substance Use Topics  . Alcohol use: No    Alcohol/week: 0.0 standard drinks  . Drug use: Not Currently    Comment: crack      Family History  Problem Relation Age of Onset  . CAD Mother   . Hypertension Mother   .  Diabetes Mother   . CAD Father   . Diabetes Father   . Sickle cell trait Other   . Asthma Other      No Known Allergies   REVIEW OF SYSTEMS(Negative unless checked)  Constitutional: [] ???Weight loss[] ???Fever[] ???Chills Cardiac:[] ???Chest pain[] ???Chest pressure[] ???Palpitations [] ???Shortness of breath when laying flat [] ???Shortness of breath at rest [] ???Shortness of breath with exertion. Vascular: [] ???Pain in legs with walking[] ???Pain in legsat rest[] ???Pain in legs when laying flat [x] ???Claudication [] ???Pain in feet when walking [] ???Pain in feet at rest [] ???Pain in feet when laying flat [] ???History of DVT [] ???Phlebitis [] ???Swelling in legs [] ???Varicose veins [x] ???Non-healing ulcers Pulmonary: [] ???Uses home oxygen [] ???Productive cough[] ???Hemoptysis [] ???Wheeze [] ???COPD [] ???Asthma Neurologic: [] ???Dizziness [] ???Blackouts [] ???Seizures [] ???History of stroke [] ???History of TIA[] ???Aphasia [] ???Temporary blindness[] ???Dysphagia [] ???Weaknessor numbness in arms [] ???Weakness or numbnessin legs Musculoskeletal: [] ???Arthritis [] ???Joint swelling [] ???Joint pain [] ???Low back pain Hematologic:[] ???Easy bruising[] ???Easy bleeding [] ???Hypercoagulable state [] ???Anemic  Gastrointestinal:[] ???Blood in stool[] ???Vomiting  blood[] ???Gastroesophageal reflux/heartburn[] ???Abdominal pain Genitourinary: [] ???Chronic kidney disease [] ???Difficulturination [] ???Frequenturination [] ???Burning with urination[] ???Hematuria Skin: [] ???Rashes [x] ???Ulcers [x] ???Wounds Psychological: [] ???History of anxiety[] ???History of major depression.   Physical Examination  BP 117/69 (BP Location: Right Arm)   Pulse 88   Resp 16   Wt 299 lb (135.6 kg)   BMI 44.80 kg/m  Gen:  WD/WN, NAD Head: [place]/AT, No temporalis wasting. Ear/Nose/Throat: Hearing grossly intact, nares w/o erythema or drainage Eyes: Conjunctiva clear. Sclera non-icteric Neck: Supple.  Trachea midline Pulmonary:  Good air movement, no use of accessory muscles.  Cardiac: RRR, no JVD Vascular:  Vessel Right Left  Radial Palpable Palpable                          PT  trace palpable  1+ palpable  DP  1+ palpable  2+ palpable    Musculoskeletal: M/S 5/5 throughout.  No deformity or atrophy.  Currently in walking postop shoes bilaterally but scheduled to come out of these next week.  Trace lower extremity edema. Neurologic: Sensation grossly intact in extremities.  Symmetrical.  Speech is fluent.  Psychiatric: Judgment intact, Mood & affect appropriate for pt's clinical situation. Dermatologic: No rashes or ulcers noted.  No cellulitis or open wounds.       Labs No results found for this or any previous visit (from the past 2160 hour(s)).  Radiology No results found.  Assessment/Plan HTN (hypertension) blood pressure control important in reducing the progression of atherosclerotic disease. On appropriate oral medications.   DM2 (diabetes mellitus, type 2) blood glucose control important in reducing the progression of atherosclerotic disease. Also, involved in wound healing. On appropriate medications.  Hyperlipidemia LDL goal <70 lipid control important in reducing the progression of atherosclerotic disease.  Continue statin therapy   Atherosclerosis of native arteries of the extremities with ulceration ([place]) ABIs today are slightly better at 0.6 on the right and 1.05 on the left.  Her digit pressures actually in the normal range on the right.  Her wounds have healed.  She is not having any significant pain.  This is encouraging.  I will plan to see her back in 4 to 6 months with noninvasive studies or sooner if problems develop in the interim.    [name], MD  10/28/2019 4:11 PM    This note was created with Dragon medical transcription system.  Any errors from dictation are purely unintentional

## 2019-10-28 NOTE — Assessment & Plan Note (Signed)
lipid control important in reducing the progression of atherosclerotic disease. Continue statin therapy  

## 2019-11-06 ENCOUNTER — Ambulatory Visit: Payer: Medicare HMO | Admitting: Dermatology

## 2019-11-19 ENCOUNTER — Ambulatory Visit: Payer: Medicare HMO | Admitting: Orthotics

## 2019-12-16 ENCOUNTER — Encounter: Payer: Medicare HMO | Attending: Physician Assistant | Admitting: Physician Assistant

## 2019-12-16 ENCOUNTER — Other Ambulatory Visit: Payer: Self-pay

## 2019-12-16 DIAGNOSIS — E669 Obesity, unspecified: Secondary | ICD-10-CM | POA: Diagnosis not present

## 2019-12-16 DIAGNOSIS — Z89411 Acquired absence of right great toe: Secondary | ICD-10-CM | POA: Insufficient documentation

## 2019-12-16 DIAGNOSIS — Z951 Presence of aortocoronary bypass graft: Secondary | ICD-10-CM | POA: Insufficient documentation

## 2019-12-16 DIAGNOSIS — Z794 Long term (current) use of insulin: Secondary | ICD-10-CM | POA: Insufficient documentation

## 2019-12-16 DIAGNOSIS — E1151 Type 2 diabetes mellitus with diabetic peripheral angiopathy without gangrene: Secondary | ICD-10-CM | POA: Diagnosis not present

## 2019-12-16 DIAGNOSIS — L84 Corns and callosities: Secondary | ICD-10-CM | POA: Insufficient documentation

## 2019-12-16 DIAGNOSIS — L97522 Non-pressure chronic ulcer of other part of left foot with fat layer exposed: Secondary | ICD-10-CM | POA: Insufficient documentation

## 2019-12-16 DIAGNOSIS — Z89421 Acquired absence of other right toe(s): Secondary | ICD-10-CM | POA: Insufficient documentation

## 2019-12-16 DIAGNOSIS — I10 Essential (primary) hypertension: Secondary | ICD-10-CM | POA: Insufficient documentation

## 2019-12-16 DIAGNOSIS — L97512 Non-pressure chronic ulcer of other part of right foot with fat layer exposed: Secondary | ICD-10-CM | POA: Diagnosis not present

## 2019-12-16 DIAGNOSIS — E1142 Type 2 diabetes mellitus with diabetic polyneuropathy: Secondary | ICD-10-CM | POA: Diagnosis not present

## 2019-12-16 DIAGNOSIS — I252 Old myocardial infarction: Secondary | ICD-10-CM | POA: Diagnosis not present

## 2019-12-16 DIAGNOSIS — E11621 Type 2 diabetes mellitus with foot ulcer: Secondary | ICD-10-CM | POA: Insufficient documentation

## 2019-12-16 DIAGNOSIS — I2510 Atherosclerotic heart disease of native coronary artery without angina pectoris: Secondary | ICD-10-CM | POA: Diagnosis not present

## 2019-12-16 DIAGNOSIS — Z89422 Acquired absence of other left toe(s): Secondary | ICD-10-CM | POA: Diagnosis not present

## 2019-12-16 DIAGNOSIS — F17200 Nicotine dependence, unspecified, uncomplicated: Secondary | ICD-10-CM | POA: Diagnosis not present

## 2019-12-16 NOTE — Progress Notes (Signed)
VARETTA, CHAVERS ([id number]) Visit Report for 12/16/2019 Abuse/Suicide Risk Screen Details Patient Name: Melinda, [name] Date of Service: 12/16/2019 9:45 AM Medical Record Number: [id number] Patient Account Number: [id number] Date of Birth/Sex: 09-29-1955 (64 y.o. F) Treating RN: [name] Primary Care Keeya Dyckman: [name] Other Clinician: Referring Zayd Bonet: [name] Treating Jarrod Mcenery/Extender: [name], [name]: 0 Abuse/Suicide Risk Screen Items Answer ABUSE RISK SCREEN: Has anyone close to you tried to hurt or harm you recentlyo No Do you feel uncomfortable with anyone in your familyo No Has anyone forced you do things that you didnot want to doo No Electronic Signature(s) Signed: 12/16/2019 4:08:51 PM By: [name] Entered By: [name] on 12/16/2019 09:34:49 Melinda [name] ([id number]) -------------------------------------------------------------------------------- Activities of Daily Living Details Patient Name: Melinda [name] Date of Service: 12/16/2019 9:45 AM Medical Record Number: [id number] Patient Account Number: [id number] Date of Birth/Sex: 12-24-1955 (64 y.o. F) Treating RN: [name] Primary Care Makennah Omura: [name] Other Clinician: Referring Mliss Wedin: [name] Treating Dontasia Miranda/Extender: [name], [name]: 0 Activities of Daily Living Items Answer Activities of Daily Living (Please select one for each item) Drive Automobile Completely Able Take Medications Completely Able Use Telephone Completely Able Care for Appearance Completely Able Use Toilet Completely Able Bath / Shower Completely Able Dress Self Completely Able Feed Self Completely Able Walk Completely Able Get In / Out Bed Completely Able Housework Completely Able Prepare Meals Completely Able Handle Money Completely Able Shop for Self Completely Able Electronic Signature(s) Signed: 12/16/2019 4:08:51 PM By: [name] Entered By: [name] on 12/16/2019 09:35:16 Melinda [name] ([id number]) -------------------------------------------------------------------------------- Education Screening Details Patient Name: Melinda [name] Date of Service: 12/16/2019 9:45 AM Medical Record Number: [id number] Patient Account Number: [id number] Date of Birth/Sex: December 29, 1955 (64 y.o. F) Treating RN: [name] Primary Care Lonie Newsham: [name] Other Clinician: Referring Bladimir Auman: [name] Treating Chrles Selley/Extender: [name], [name]: 0 Learning Preferences/Education Level/Primary Language Learning Preference: Explanation, Demonstration, Printed Material Highest Education Level: High School Preferred Language: English Cognitive Barrier Language Barrier: No Translator Needed: No Memory Deficit: No Emotional Barrier: No Cultural/Religious Beliefs Affecting Medical Care: No Physical Barrier Impaired Vision: No Impaired Hearing: No Decreased Hand dexterity: No Knowledge/Comprehension Knowledge Level: High Comprehension Level: High Ability to understand written instructions: High Ability to understand verbal instructions: High Motivation Anxiety Level: Calm Cooperation: Cooperative Education Importance: Acknowledges Need Interest in Health Problems: Asks Questions Perception: Coherent Willingness to Engage in Self-Management High Activities: Readiness to Engage in Self-Management High Activities: Electronic Signature(s) Signed: 12/16/2019 4:08:51 PM By: [name] Entered By: [name] on 12/16/2019 09:35:45 Melinda [name] ([id number]) -------------------------------------------------------------------------------- Fall Risk Assessment Details Patient Name: Melinda [name] Date of Service: 12/16/2019 9:45 AM Medical Record Number: [id number] Patient Account Number: [id number] Date of Birth/Sex: Apr 20, 1956 (64 y.o. F) Treating RN: [name] Primary Care Myrna Vonseggern: [name] Other Clinician: Referring Anwen Cannedy: [name] Treating Sharrieff Spratlin/Extender: [name], [name]: 0 Fall Risk Assessment Items Have you had 2 or more falls in the last 12 monthso 0 No Have you had any fall that resulted in injury in the last 12 monthso 0 No FALLS RISK SCREEN History of falling - immediate or within 3 months 0 No Secondary diagnosis (Do you have 2 or more medical diagnoseso) 0 No Ambulatory aid None/bed rest/wheelchair/nurse 0 No Crutches/cane/walker 0 No Furniture 0 No Intravenous therapy Access/Saline/Heparin Lock 0 No Gait/Transferring Normal/ bed rest/ wheelchair 0 No Weak (short steps with or  without shuffle, stooped but able to lift head while walking, may 0 No seek support from furniture) Impaired (short steps with shuffle, may have difficulty arising from chair, head down, impaired 0 No balance) Mental Status Oriented to own ability 0 No Electronic Signature(s) Signed: 12/16/2019 4:08:51 PM By: [name] Entered By: [name] on 12/16/2019 09:35:59 Melinda [name] ([id number]) -------------------------------------------------------------------------------- Foot Assessment Details Patient Name: Melinda [name] Date of Service: 12/16/2019 9:45 AM Medical Record Number: [id number] Patient Account Number: [id number] Date of Birth/Sex: Aug 20, 1955 (64 y.o. F) Treating RN: [name] Primary Care Cassandr Cederberg: [name] Other Clinician: Referring Yajayra Feldt: [name] Treating Masen Salvas/Extender: [name], [name]: 0 Foot Assessment Items Site Locations + = Sensation present, - = Sensation absent, C = Callus, U = Ulcer R = Redness, W = Warmth, M = Maceration, PU = Pre-ulcerative lesion F = Fissure, S = Swelling, D = Dryness Assessment Right: Left: Other Deformity: No No Prior Foot Ulcer: No No Prior Amputation: No No Charcot Joint: No No Ambulatory Status:  Ambulatory Without Help Gait: Steady Electronic Signature(s) Signed: 12/16/2019 4:08:51 PM By: [name] Entered By: [name] on 12/16/2019 09:46:35 Melinda [name] ([id number]) -------------------------------------------------------------------------------- Nutrition Risk Screening Details Patient Name: Melinda [name] Date of Service: 12/16/2019 9:45 AM Medical Record Number: [id number] Patient Account Number: [id number] Date of Birth/Sex: 10/08/1955 (64 y.o. F) Treating RN: [name] Primary Care Robben Jagiello: [name] Other Clinician: Referring Kayanna Mckillop: [name] Treating Lyndy Russman/Extender: [name], [name]: 0 Height (in): Weight (lbs): Body Mass Index (BMI): Nutrition Risk Screening Items Score Screening NUTRITION RISK SCREEN: I have an illness or condition that made me change the kind and/or amount of food I eat 0 No I eat fewer than two meals per day 0 No I eat few fruits and vegetables, or milk products 0 No I have three or more drinks of beer, liquor or wine almost every day 0 No I have tooth or mouth problems that make it hard for me to eat 0 No I don't always have enough money to buy the food I need 0 No I eat alone most of the time 0 No I take three or more different prescribed or over-the-counter drugs a day 1 Yes Without wanting to, I have lost or gained 10 pounds in the last six months 0 No I am not always physically able to shop, cook and/or feed myself 0 No Nutrition Protocols Good Risk Protocol 0 No interventions needed Moderate Risk Protocol High Risk Proctocol Risk Level: Good Risk Score: 1 Electronic Signature(s) Signed: 12/16/2019 4:08:51 PM By: [name] Entered By: [name] on 12/16/2019 09:36:16

## 2019-12-17 ENCOUNTER — Telehealth: Payer: Self-pay | Admitting: Podiatry

## 2019-12-19 NOTE — Progress Notes (Signed)
MICAIAH, REMILLARD ([id number]) Visit Report for 12/16/2019 Allergy List Details Patient Name: Melinda [name], Melinda [name] Date of Service: 12/16/2019 9:45 AM Medical Record Number: [id number] Patient Account Number: [id number] Date of Birth/Sex: 1955/07/11 (64 y.o. F) Treating RN: [name] Primary Care Sandar Krinke: [name] Other Clinician: Referring Kalika Smay: [name] Treating Dorinne Graeff/Extender: [name], [name]: 0 Allergies Active Allergies No Known Drug Allergies Allergy Notes Electronic Signature(s) Signed: 12/16/2019 4:08:51 PM By: [name] Entered By: [name] on 12/16/2019 09:42:55 Melinda [name] ([id number]) -------------------------------------------------------------------------------- Arrival Information Details Patient Name: Melinda [name] Date of Service: 12/16/2019 9:45 AM Medical Record Number: [id number] Patient Account Number: [id number] Date of Birth/Sex: 1956/01/14 (64 y.o. F) Treating RN: [name] Primary Care Nahsir Venezia: [name] Other Clinician: Referring Krosby Ritchie: [name] Treating Horice Carrero/Extender: [name], [name]: 0 Visit Information Patient Arrived: Ambulatory Arrival Time: 09:16 Accompanied By: self Transfer Assistance: None Patient Has Alerts: Yes Patient Alerts: 10/28/19 ABI: R 1.05 L .60 History Since Last Visit Added or deleted any medications: No Had a fall or experienced change in activities of daily living that may affect risk of falls: No Hospitalized since last visit: No Electronic Signature(s) Signed: 12/16/2019 4:08:51 PM By: [name] Entered By: [name] on 12/16/2019 09:24:51 Melinda [name] ([id number]) -------------------------------------------------------------------------------- Clinic Level of Care Assessment Details Patient Name: Melinda [name] Date of Service: 12/16/2019 9:45 AM Medical Record Number: [id number] Patient Account Number:  [id number] Date of Birth/Sex: [birth year] (64 y.o. F) Treating RN: [name] Primary Care Ziair Penson: [name] Other Clinician: Referring Sabrina Keough: [name] Treating Cleone Hulick/Extender: [name], [name]: 0 Clinic Level of Care Assessment Items TOOL 1 Quantity Score []  - Use when EandM and Procedure is performed on INITIAL visit 0 ASSESSMENTS - Nursing Assessment / Reassessment X - General Physical Exam (combine w/ comprehensive assessment (listed just below) when performed on new 1 20 pt. evals) X- 1 25 Comprehensive Assessment (HX, ROS, Risk Assessments, Wounds Hx, etc.) ASSESSMENTS - Wound and Skin Assessment / Reassessment []  - Dermatologic / Skin Assessment (not related to wound area) 0 ASSESSMENTS - Ostomy and/or Continence Assessment and Care []  - Incontinence Assessment and Management 0 []  - 0 Ostomy Care Assessment and Management (repouching, etc.) PROCESS - Coordination of Care X - Simple Patient / Family Education for ongoing care 1 15 []  - 0 Complex (extensive) Patient / Family Education for ongoing care []  - 0 Staff obtains Programmer, systems, Records, Test Results / Process Orders []  - 0 Staff telephones HHA, Nursing Homes / Clarify orders / etc []  - 0 Routine Transfer to another Facility (non-emergent condition) []  - 0 Routine Hospital Admission (non-emergent condition) X- 1 15 New Admissions / Biomedical engineer / Ordering NPWT, Apligraf, etc. []  - 0 Emergency Hospital Admission (emergent condition) PROCESS - Special Needs []  - Pediatric / Minor Patient Management 0 []  - 0 Isolation Patient Management []  - 0 Hearing / Language / Visual special needs []  - 0 Assessment of Community assistance (transportation, D/C planning, etc.) []  - 0 Additional assistance / Altered mentation []  - 0 Support Surface(s) Assessment (bed, cushion, seat, etc.) INTERVENTIONS - Miscellaneous []  - External ear exam 0 []  - 0 Patient Transfer (multiple staff /  Civil Service fast streamer / Similar devices) []  - 0 Simple Staple / Suture removal (25 or less) []  - 0 Complex Staple / Suture removal (26 or more) []  - 0 Hypo/Hyperglycemic Management (do not check if billed separately) []  - 0 Ankle / Brachial Index (  ABI) - do not check if billed separately Has the patient been seen at the hospital within the last three years: Yes Total Score: 75 Level Of Care: New/Established - Level 2 Melinda [name], Melinda [name] ([id number]) Electronic Signature(s) Signed: 12/18/2019 6:39:29 PM By: [name], [name], RN, [name], [name] RN, [name] Entered By: [name], [name], RN, [name], [name] on 12/16/2019 10:14:01 Melinda [name] ([id number]) -------------------------------------------------------------------------------- Encounter Discharge Information Details Patient Name: Melinda [name], Melinda [name] Date of Service: 12/16/2019 9:45 AM Medical Record Number: [id number] Patient Account Number: [id number] Date of Birth/Sex: 18-Jun-1955 (64 y.o. F) Treating RN: [name] Primary Care Hawkins Seaman: [name] Other Clinician: Referring Starlene Consuegra: [name] Treating Adren Dollins/Extender: [name], [name]: 0 Encounter Discharge Information Items Post Procedure Vitals Discharge Condition: Stable Temperature (F): 98.4 Ambulatory Status: Ambulatory Pulse (bpm): 89 Discharge Destination: Home Respiratory Rate (breaths/min): 18 Transportation: Private Auto Blood Pressure (mmHg): 127/59 Accompanied By: self Schedule Follow-up Appointment: No Clinical Summary of Care: Electronic Signature(s) Signed: 12/18/2019 6:39:29 PM By: [name], [name], RN, [name], [name] RN, [name] Entered By: [name], [name], RN, [name], [name] on 12/16/2019 10:17:14 Melinda [name] ([id number]) -------------------------------------------------------------------------------- Lower Extremity Assessment Details Patient Name: Melinda [name], Melinda [name] Date of Service: 12/16/2019 9:45 AM Medical Record Number: [id number] Patient Account Number: [id number] Date of Birth/Sex:  11/27/1955 (64 y.o. F) Treating RN: [name] Primary Care Genella Bas: [name] Other Clinician: Referring Kimesha Claxton: [name] Treating Tamico Mundo/Extender: [name], [name]: 0 Edema Assessment Assessed: [Left: Yes] [Right: Yes] Edema: [Left: No] [Right: No] Calf Left: Right: Point of Measurement: 33 cm From Medial Instep 38 cm 38.5 cm Ankle Left: Right: Point of Measurement: 12 cm From Medial Instep 23 cm 23 cm Vascular Assessment Pulses: Dorsalis Pedis Palpable: [Left:No] [Right:No] Doppler Audible: [Left:Yes] [Right:Yes] Posterior Tibial Palpable: [Left:No Yes] [Right:No Yes] Electronic Signature(s) Signed: 12/16/2019 4:08:51 PM By: [name] Entered By: [name] on 12/16/2019 09:42:45 Melinda [name] ([id number]) -------------------------------------------------------------------------------- Multi Wound Chart Details Patient Name: Melinda [name] Date of Service: 12/16/2019 9:45 AM Medical Record Number: [id number] Patient Account Number: [id number] Date of Birth/Sex: [birth year] (64 y.o. 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 Extremity Comorbid History: Cataracts, Coronary Artery Disease, Cataracts, Coronary Artery Disease, N/A Hypertension, Myocardial Infarction, Hypertension, Myocardial Infarction, Peripheral Venous Disease, Type II Peripheral Venous Disease, Type II Diabetes,  Osteoarthritis, Diabetes, Osteoarthritis, Osteomyelitis, Neuropathy Osteomyelitis, Neuropathy Date Acquired: 12/02/2019 12/02/2019 N/A Weeks of Treatment: 0 0 N/A Wound Status: Open Open N/A Measurements L x W x D (cm) 2x1.6x0.9 1x1.3x0.1 N/A Area (cm) : 2.513 1.021 N/A Volume (cm) : 2.262 0.102 N/A % Reduction in Area: 0.00% 0.00% N/A % Reduction in Volume: 0.00% 0.00% N/A Starting Position 1 (o'clock): 8 Ending Position 1 (o'clock): 8 Maximum Distance 1 (cm): 0.4 Undermining: Yes No N/A Classification: Grade 2 Grade 1 N/A Exudate Amount: Medium None Present N/A Exudate Type: Serosanguineous N/A N/A Exudate Color: red, brown N/A N/A Wound Margin: Flat and Intact Flat and Intact N/A Granulation Amount: Small (1-33%) None Present (0%) N/A Granulation Quality: Red N/A N/A Necrotic Amount: Medium (34-66%) None Present (0%) N/A Exposed Structures: Fat Layer (Subcutaneous  Tissue) Fascia: No N/A Exposed: Yes Fat Layer (Subcutaneous Tissue) Fascia: No Exposed: No Tendon: No Tendon: No Muscle: No Muscle: No Joint: No Joint: No Bone: No Bone: No Epithelialization: N/A None N/A Assessment Notes: N/A calloused area N/A Treatment Notes Melinda [name], Melinda [name] ([id number]) Electronic Signature(s) Signed: 12/18/2019 6:39:29 PM By: [name], [name], RN, [name], [name] RN, [name] Entered By: [name], [name], RN, [name], [name] on 12/16/2019 10:01:31 Melinda [name] ([id number]) -------------------------------------------------------------------------------- [place] Plan Details Patient Name: Melinda [name] Date of Service: 12/16/2019 9:45 AM Medical Record Number: [id number] Patient Account Number: [id number] Date of Birth/Sex: May 31, 1955 (64 y.o. F) Treating RN: [name] Primary Care Lennette Fader: [name] Other Clinician: Referring Skarleth Delmonico: [name] Treating Leonetta Mcgivern/Extender: [name], [name]: 0 Active Inactive Necrotic Tissue Nursing Diagnoses: Impaired tissue  integrity related to necrotic/devitalized tissue Goals: Necrotic/devitalized tissue will be minimized in the wound bed Date Initiated: 12/16/2019 Target Resolution Date: 12/26/2019 Goal Status: Active Interventions: Assess patient pain level pre-, during and post procedure and prior to discharge Treatment Activities: Excisional debridement : 12/16/2019 Notes: Orientation to the Wound Care Program Nursing Diagnoses: Knowledge deficit related to the wound healing center program Goals: Patient/caregiver will verbalize understanding of the [place] Date Initiated: 12/16/2019 Target Resolution Date: 12/26/2019 Goal Status: Active Interventions: Provide education on orientation to the wound center Notes: Peripheral Neuropathy Nursing Diagnoses: Knowledge deficit related to disease process and management of peripheral neurovascular dysfunction Potential alteration in peripheral tissue perfusion (select prior to confirmation of diagnosis) Goals: Patient/caregiver will verbalize understanding of disease process and disease management Date Initiated: 12/16/2019 Target Resolution Date: 12/26/2019 Goal Status: Active Interventions: Provide education on Management of Neuropathy and Related Ulcers Treatment Activities: Patient referred for customized footwear/offloading : 12/16/2019 Notes: Pressure Nursing Diagnoses: Melinda [name], Melinda [name] ([id number]) Knowledge deficit related to causes and risk factors for pressure ulcer development Knowledge deficit related to management of pressures ulcers Goals: Patient will remain free from development of additional pressure ulcers Date Initiated: 12/16/2019 Target Resolution Date: 12/26/2019 Goal Status: Active Interventions: Provide education on pressure ulcers Notes: Wound/Skin Impairment Nursing Diagnoses: Impaired tissue integrity Goals: Ulcer/skin breakdown will have a volume reduction of 30% by week 4 Date Initiated: 12/16/2019  Target Resolution Date: 01/16/2020 Goal Status: Active Interventions: Assess patient/caregiver ability to obtain necessary supplies Assess ulceration(s) every visit Treatment Activities: Skin care regimen initiated : 12/16/2019 Notes: Electronic Signature(s) Signed: 12/18/2019 6:39:29 PM By: [name], [name], RN, [name], [name] RN, [name] Entered By: [name], [name], RN, [name], [name] on 12/16/2019 10:01:03 Melinda [name] ([id number]) -------------------------------------------------------------------------------- Non-Wound Condition Assessment Details Patient Name: Melinda [name] Date of Service: 12/16/2019 9:45 AM Medical Record Number: [id number] Patient Account Number: [id number] Date of Birth/Sex: [birth year] (64 y.o. F) Treating RN: [name] Primary Care Dannis Deroche: [name] Other Clinician: Referring Lousie Calico: [name] Treating Sotirios Navarro/Extender: [name], [name]: 0 Non-Wound Condition: Condition: Other Dermatologic Condition Location: Foot Side: Right Photos Notes Callus Right Great Toe Electronic Signature(s) Signed: 12/16/2019 11:08:24 AM By: [name], [name], RN, [name], [name] RN, [name] Previous Signature: 12/16/2019 10:27:48 AM Version By: [name] [name] Entered By: [name], [name], RN, [name], [name] on 12/16/2019 11:08:23 Melinda [name] ([id number]) -------------------------------------------------------------------------------- Pain Assessment Details Patient Name: Melinda [name], Melinda [name] Date of Service: 12/16/2019 9:45 AM Medical Record Number: [id number] Patient Account Number: [id number] Date of Birth/Sex: 1955-07-12 (64 y.o. F) Treating RN: [name] Primary Care Mar Zettler: [name] Other Clinician: Referring Liller Yohn: [name] Treating Kelsee Preslar/Extender: [name], [name]: 0  Active Problems Location of Pain Severity and Description of Pain Patient Has Paino Yes Site Locations Pain Location: Pain in Ulcers Duration of the Pain. Constant /  Intermittento Intermittent Rate the pain. Current Pain Level: 4 Pain Management and Medication Current Pain Management: Rest: Yes Electronic Signature(s) Signed: 12/16/2019 4:08:51 PM By: [name] Entered By: [name] on 12/16/2019 09:25:16 Melinda [name] ([id number]) -------------------------------------------------------------------------------- Patient/Caregiver Education Details Patient Name: Melinda [name] Date of Service: 12/16/2019 9:45 AM Medical Record Number: [id number] Patient Account Number: [id number] Date of Birth/Gender: 10/09/1955 (64 y.o. F) Treating RN: [name] Primary Care Physician: [name] Other Clinician: Referring Physician: [name] Treating Physician/Extender: Sharalyn Ink in Treatment: 0 Education Assessment Education Provided To: Patient Education Topics Provided Offloading: Handouts: What is Offloadingo, Other: Left peg assist; Methods: Demonstration, Explain/Verbal Responses: State content correctly Pressure: Handouts: Pressure Ulcers: Care and Offloading Methods: Demonstration, Explain/Verbal Responses: State content correctly Welcome To [facility]: Handouts: Welcome To [facility] Methods: Demonstration, Explain/Verbal Responses: State content correctly Wound Debridement: Handouts: Wound Debridement Methods: Demonstration, Explain/Verbal Responses: State content correctly Electronic Signature(s) Signed: 12/18/2019 6:39:29 PM By: [name], [name], RN, [name], [name] RN, [name] Entered By: [name], [name], RN, [name], [name] on 12/16/2019 10:15:37 Melinda [name] ([id number]) -------------------------------------------------------------------------------- Wound Assessment Details Patient Name: Melinda [name], Melinda [name] Date of Service: 12/16/2019 9:45 AM Medical Record Number: [id number] Patient Account Number: [id number] Date of Birth/Sex: [birth year] (64 y.o. F) Treating RN: [name] Primary Care Jeselle Hiser:  [name] Other Clinician: Referring Jessenia Filippone: [name] Treating Alizaya Oshea/Extender: [name], [name]: 0 Wound Status Wound Number: 5 Primary Diabetic Wound/Ulcer of the Lower Extremity Etiology: Wound Location: Left, Plantar Toe Great Wound Open Wounding Event: Gradually Appeared Status: Date Acquired: 12/02/2019 Comorbid Cataracts, Coronary Artery Disease, Hypertension, Weeks Of Treatment: 0 History: Myocardial Infarction, Peripheral Venous Disease, Type II Clustered Wound: No Diabetes, Osteoarthritis, Osteomyelitis, Neuropathy Photos Wound Measurements Length: (cm) 2 % Width: (cm) 1.6 % Depth: (cm) 0.9 Un Area: (cm) 2.513 Volume: (cm) 2.262 Reduction in Area: 0% Reduction in Volume: 0% dermining: Yes Starting Position (o'clock): 8 Ending Position (o'clock): 8 Maximum Distance: (cm) 0.4 Wound Description Classification: Grade 2 F Wound Margin: Flat and Intact S Exudate Amount: Medium Exudate Type: Serosanguineous Exudate Color: red, brown oul Odor After Cleansing: No lough/Fibrino Yes Wound Bed Granulation Amount: Small (1-33%) Exposed Structure Granulation Quality: Red Fascia Exposed: No Necrotic Amount: Medium (34-66%) Fat Layer (Subcutaneous Tissue) Exposed: Yes Necrotic Quality: Adherent Slough Tendon Exposed: No Muscle Exposed: No Joint Exposed: No Bone Exposed: No Treatment Notes Wound #5 (Left, Plantar Toe Great) Notes SILver alginate, gause and conform, peg assist shoe Melinda [name], Melinda [name] ([id number]) Electronic Signature(s) Signed: 12/16/2019 4:08:51 PM By: [name] Entered By: [name] on 12/16/2019 09:38:43 Melinda [name] ([id number]) -------------------------------------------------------------------------------- Vitals Details Patient Name: Melinda [name] Date of Service: 12/16/2019 9:45 AM Medical Record Number: [id number] Patient Account Number: [id number] Date of Birth/Sex: 15-Jun-1955 (64 y.o. F)  Treating RN: [name] Primary Care Gayanne Prescott: [name] Other Clinician: Referring Shamikia Linskey: [name] Treating Candiss Galeana/Extender: [name], [name]: 0 Vital Signs Time Taken: 09:25 Temperature (F): 98.4 Pulse (bpm): 89 Respiratory Rate (breaths/min): 18 Blood Pressure (mmHg): 127/59 Reference Range: 80 - 120 mg / dl Electronic Signature(s) Signed: 12/16/2019 4:08:51 PM By: [name] Entered By: [name] on 12/16/2019 09:25:44

## 2019-12-19 NOTE — Progress Notes (Signed)
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 [facility] Procedures CPT4 Code: [id number] Description: 64680 - WOUND CARE VISIT-LEV 2 EST PT Modifier: Quantity: 1 CPT4 Code: [id number] Description: 50037 - DEB SUBQ TISSUE 20 SQ CM/< Modifier: Quantity: 1 CPT4 Code: Description: ICD-10 Diagnosis Description 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 Modifier: sed Quantity: CPT4 Code: [id number] Description: 94503 - PARE BENIGN LES; SGL Modifier: Quantity: 1 CPT4 Code: Description: ICD-10 Diagnosis Description L84 [facility] Modifier: Quantity: Physician Procedures CPT4 Code: [id number] Description: 34917 - WC PHYS LEVEL 4 - EST PT Modifier: 25 Quantity: 1 CPT4 Code: Description: ICD-10 Diagnosis Description E11.621 Type 2 diabetes mellitus with foot ulcer 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 I73.89 Other specified peripheral vascular diseases E11.51 Type 2 diabetes  mellitus with diabetic peripheral angiopathy without gang Modifier: sed rene Quantity: CPT4 Code: [id number] Description: 11042 - WC PHYS SUBQ TISS 20 SQ CM Modifier: Quantity: 1 CPT4 Code: Description: ICD-10 Diagnosis Description 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

## 2019-12-23 ENCOUNTER — Other Ambulatory Visit: Payer: Self-pay

## 2019-12-23 ENCOUNTER — Encounter: Payer: Medicare HMO | Admitting: Physician Assistant

## 2019-12-23 DIAGNOSIS — E11621 Type 2 diabetes mellitus with foot ulcer: Secondary | ICD-10-CM | POA: Diagnosis not present
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
## 2019-12-24 NOTE — Progress Notes (Signed)
MYCHAL, DURIO ([id number]) Visit Report for 12/23/2019 Arrival Information Details Patient Name: Melinda [name], Melinda [name] Date of Service: 12/23/2019 10:45 AM Medical Record Number: [id number] Patient Account Number: [id number] Date of Birth/Sex: 18-Mar-1956 (64 y.o. F) Treating RN: [name] Primary Care Saraia Platner: [name] Other Clinician: Referring Arvie Bartholomew: [name] Treating Rena Sweeden/Extender: [name], [name]: 1 Visit Information History Since Last Visit All ordered tests and consults were completed: No Patient Arrived: Ambulatory Added or deleted any medications: No Arrival Time: 10:36 Any new allergies or adverse reactions: No Accompanied By: self Had a fall or experienced change in No Transfer Assistance: None activities of daily living that may affect Patient Identification Verified: Yes risk of falls: Secondary Verification Process Completed: Yes Signs or symptoms of abuse/neglect since last visito No Patient Requires Transmission-Based No Hospitalized since last visit: No Precautions: Implantable device outside of the clinic excluding No Patient Has Alerts: Yes cellular tissue based products placed in the center Patient Alerts: 10/28/19 ABI: R 1.05 since last visit: L .60 Has Dressing in Place as Prescribed: Yes Pain Present Now: No Electronic Signature(s) Signed: 12/23/2019 11:15:54 AM By: Darci Needle Entered By: Darci Needle on 12/23/2019 10:37:45 Melinda [name] ([id number]) -------------------------------------------------------------------------------- Encounter Discharge Information Details Patient Name: Melinda [name] Date of Service: 12/23/2019 10:45 AM Medical Record Number: [id number] Patient Account Number: [id number] Date of Birth/Sex: 1955-05-22 (64 y.o. F) Treating RN: [name] Primary Care Ursula Dermody: [name] Other Clinician: Referring Habeeb Puertas: [name] Treating Aleira Deiter/Extender: [name], [name] Weeks in  Treatment: 1 Encounter Discharge Information Items Post Procedure Vitals Discharge Condition: Stable Temperature (F): 98.7 Ambulatory Status: Ambulatory Pulse (bpm): 88 Discharge Destination: Home Respiratory Rate (breaths/min): 20 Transportation: Private Auto Blood Pressure (mmHg): 147/44 Accompanied By: self Schedule Follow-up Appointment: Yes Clinical Summary of Care: Electronic Signature(s) Signed: 12/23/2019 7:43:28 PM By: [name], [name], RN, [name], [name] RN, [name] Entered By: [name], [name], RN, [name], [name] on 12/23/2019 11:02:12 Melinda [name] ([id number]) -------------------------------------------------------------------------------- Lower Extremity Assessment Details Patient Name: Melinda [name], Melinda [name] Date of Service: 12/23/2019 10:45 AM Medical Record Number: [id number] Patient Account Number: [id number] Date of Birth/Sex: 28-Aug-1955 (64 y.o. F) Treating RN: [name] Primary Care Sherrika Weakland: [name] Other Clinician: Referring Brooksie Ellwanger: [name] Treating Elfida Shimada/Extender: [name], [name]: 1 Edema Assessment Assessed: [Left: Yes] [Right: No] [Left: Edema] [Right: :] Calf Left: Right: Point of Measurement: 33 cm From Medial Instep 39 cm cm Ankle Left: Right: Point of Measurement: 12 cm From Medial Instep 25 cm cm Vascular Assessment Pulses: Dorsalis Pedis Palpable: [Left:Yes] Posterior Tibial Palpable: [Left:Yes] Electronic Signature(s) Signed: 12/23/2019 11:15:54 AM By: Darci Needle Signed: 12/23/2019 7:43:28 PM By: [name], [name], RN, [name], [name] RN, [name] Entered By: Darci Needle on 12/23/2019 10:42:53 Melinda [name] ([id number]) -------------------------------------------------------------------------------- Multi Wound Chart Details Patient Name: Melinda [name] Date of Service: 12/23/2019 10:45 AM Medical Record Number: [id number] Patient Account Number: [id number] Date of Birth/Sex: [birth year] (64 y.o. F) Treating RN: [name] Primary Care  Valencia Kassa: [name] Other Clinician: Referring Monnica Saltsman: [name] Treating Breck Hollinger/Extender: [name], [name]: 1 Vital Signs Height(in): Pulse(bpm): 34 Weight(lbs): Blood Pressure(mmHg): 147/44 Body Mass Index(BMI): Temperature(F): 98.7 Respiratory Rate(breaths/min): 20 Photos: [N/A:N/A] Wound Location: Left, Plantar Toe Great N/A N/A Wounding Event: Gradually Appeared N/A N/A Primary Etiology: Diabetic Wound/Ulcer of the Lower N/A N/A Extremity Comorbid History: Cataracts, Coronary Artery Disease, N/A N/A Hypertension, Myocardial Infarction, Peripheral Venous Disease, Type II Diabetes, Osteoarthritis, Osteomyelitis, Neuropathy Date Acquired: 12/02/2019 N/A N/A Weeks of Treatment:  1 N/A N/A Wound Status: Open N/A N/A Measurements L x W x D (cm) 0.3x1x0.1 N/A N/A Area (cm) : 0.236 N/A N/A Volume (cm) : 0.024 N/A N/A % Reduction in Area: 90.60% N/A N/A % Reduction in Volume: 98.90% N/A N/A Classification: Grade 2 N/A N/A Exudate Amount: Medium N/A N/A Exudate Type: Serosanguineous N/A N/A Exudate Color: red, brown N/A N/A Wound Margin: Flat and Intact N/A N/A Granulation Amount: Small (1-33%) N/A N/A Granulation Quality: Red N/A N/A Necrotic Amount: Medium (34-66%) N/A N/A Exposed Structures: Fat Layer (Subcutaneous Tissue): N/A N/A Yes Fascia: No Tendon: No Muscle: No Joint: No Bone: No Treatment Notes Electronic Signature(s) Signed: 12/23/2019 7:43:28 PM By: [name], [name], RN, [name], [name] RN, [name] Entered By: [name], [name], RN, [name], [name] on 12/23/2019 10:51:34 Melinda [name] ([id number]) -------------------------------------------------------------------------------- [place] Details Patient Name: Melinda [name] Date of Service: 12/23/2019 10:45 AM Medical Record Number: [id number] Patient Account Number: [id number] Date of Birth/Sex: 1955/08/03 (64 y.o. F) Treating RN: [name] Primary Care [place] Giammona: [name] Other  Clinician: Referring Ester Hilley: [name] Treating Kaelan Amble/Extender: [name], [name]: 1 Active Inactive Necrotic Tissue Nursing Diagnoses: Impaired tissue integrity related to necrotic/devitalized tissue Goals: Necrotic/devitalized tissue will be minimized in the wound bed Date Initiated: 12/16/2019 Target Resolution Date: 12/26/2019 Goal Status: Active Interventions: Assess patient pain level pre-, during and post procedure and prior to discharge Treatment Activities: Excisional debridement : 12/16/2019 Notes: Orientation to the Wound Care Program Nursing Diagnoses: Knowledge deficit related to the wound healing center program Goals: Patient/caregiver will verbalize understanding of the [place] Date Initiated: 12/16/2019 Target Resolution Date: 12/26/2019 Goal Status: Active Interventions: Provide education on orientation to the wound center Notes: Peripheral Neuropathy Nursing Diagnoses: Knowledge deficit related to disease process and management of peripheral neurovascular dysfunction Potential alteration in peripheral tissue perfusion (select prior to confirmation of diagnosis) Goals: Patient/caregiver will verbalize understanding of disease process and disease management Date Initiated: 12/16/2019 Target Resolution Date: 12/26/2019 Goal Status: Active Interventions: Provide education on Management of Neuropathy and Related Ulcers Treatment Activities: Patient referred for customized footwear/offloading : 12/16/2019 Notes: Pressure Nursing Diagnoses: LOUETTA, [place] ([id number]) Knowledge deficit related to causes and risk factors for pressure ulcer development Knowledge deficit related to management of pressures ulcers Goals: Patient will remain free from development of additional pressure ulcers Date Initiated: 12/16/2019 Target Resolution Date: 12/26/2019 Goal Status: Active Interventions: Provide education on pressure  ulcers Notes: Wound/Skin Impairment Nursing Diagnoses: Impaired tissue integrity Goals: Ulcer/skin breakdown will have a volume reduction of 30% by week 4 Date Initiated: 12/16/2019 Target Resolution Date: 01/16/2020 Goal Status: Active Interventions: Assess patient/caregiver ability to obtain necessary supplies Assess ulceration(s) every visit Treatment Activities: Skin care regimen initiated : 12/16/2019 Notes: Electronic Signature(s) Signed: 12/23/2019 7:43:28 PM By: [name], [name], RN, [name], [name] RN, [name] Entered By: [name], [name], RN, [name], [name] on 12/23/2019 10:51:24 Melinda [name] ([id number]) -------------------------------------------------------------------------------- Pain Assessment Details Patient Name: Melinda [name] Date of Service: 12/23/2019 10:45 AM Medical Record Number: [id number] Patient Account Number: [id number] Date of Birth/Sex: Aug 17, 1955 (64 y.o. F) Treating RN: [name] Primary Care Sherma Vanmetre: [name] Other Clinician: Referring Rickell Wiehe: [name] Treating Lani Mendiola/Extender: [name], [name]: 1 Active Problems Location of Pain Severity and Description of Pain Patient Has Paino No Site Locations With Dressing Change: No Pain Management and Medication Current Pain Management: Electronic Signature(s) Signed: 12/23/2019 11:15:54 AM By: Darci Needle Signed: 12/23/2019 7:43:28 PM By: [name], [name], RN, [name], [name]  RN, [name] Entered By: Darci Needle on 12/23/2019 10:38:46 Melinda [name] ([id number]) -------------------------------------------------------------------------------- Patient/Caregiver Education Details Patient Name: Melinda [name] Date of Service: 12/23/2019 10:45 AM Medical Record Number: [id number] Patient Account Number: [id number] Date of Birth/Gender: 05-24-1955 (64 y.o. F) Treating RN: [name] Primary Care Physician: [name] Other Clinician: Referring Physician: [name] Treating Physician/Extender: Sharalyn Ink in Treatment: 1 Education Assessment Education Provided To: Patient Education Topics Provided Pressure: Handouts: Preventing Pressure Ulcers Methods: Demonstration, Explain/Verbal Responses: State content correctly Electronic Signature(s) Signed: 12/23/2019 7:43:28 PM By: [name], [name], RN, [name], [name] RN, [name] Entered By: [name], [name], RN, [name], [name] on 12/23/2019 11:00:22 Melinda [name] ([id number]) -------------------------------------------------------------------------------- Wound Assessment Details Patient Name: Melinda [name], Melinda [name] Date of Service: 12/23/2019 10:45 AM Medical Record Number: [id number] Patient Account Number: [id number] Date of Birth/Sex: [birth year] (64 y.o. F) Treating RN: [name] Primary Care Tranell Wojtkiewicz: [name] Other Clinician: Referring Darl Kuss: [name] Treating Kimari Coudriet/Extender: [name], [name]: 1 Wound Status Wound Number: 5 Primary Diabetic Wound/Ulcer of the Lower Extremity Etiology: Wound Location: Left, Plantar Toe Great Wound Open Wounding Event: Gradually Appeared Status: Date Acquired: 12/02/2019 Comorbid Cataracts, Coronary Artery Disease, Hypertension, Weeks Of Treatment: 1 History: Myocardial Infarction, Peripheral Venous Disease, Type II Clustered Wound: No Diabetes, Osteoarthritis, Osteomyelitis, Neuropathy Photos Wound Measurements Length: (cm) 0.3 Width: (cm) 1 Depth: (cm) 0.1 Area: (cm) 0.236 Volume: (cm) 0.024 % Reduction in Area: 90.6% % Reduction in Volume: 98.9% Wound Description Classification: Grade 2 F Wound Margin: Flat and Intact S Exudate Amount: Medium Exudate Type: Serosanguineous Exudate Color: red, brown oul Odor After Cleansing: No lough/Fibrino Yes Wound Bed Granulation Amount: Small (1-33%) Exposed Structure Granulation Quality: Red Fascia Exposed: No Necrotic Amount: Medium (34-66%) Fat Layer (Subcutaneous Tissue) Exposed: Yes Necrotic Quality: Adherent  Slough Tendon Exposed: No Muscle Exposed: No Joint Exposed: No Bone Exposed: No Treatment Notes Wound #5 (Left, Plantar Toe Great) Notes SILver alginate, gause and conform, peg assist shoe Electronic Signature(s) SHARNA, GABRYS ([id number]) Signed: 12/23/2019 11:15:54 AM By: Darci Needle Signed: 12/23/2019 7:43:28 PM By: [name], [name], RN, [name], [name] RN, [name] Entered By: Darci Needle on 12/23/2019 10:44:43 Melinda [name] ([id number]) -------------------------------------------------------------------------------- Vitals Details Patient Name: Melinda [name] Date of Service: 12/23/2019 10:45 AM Medical Record Number: [id number] Patient Account Number: [id number] Date of Birth/Sex: Nov 29, 1955 (64 y.o. F) Treating RN: [name] Primary Care Aylene Acoff: [name] Other Clinician: Referring Lyrick Worland: [name] Treating Thayden Lemire/Extender: [name], [name]: 1 Vital Signs Time Taken: 10:25 Temperature (F): 98.7 Pulse (bpm): 88 Respiratory Rate (breaths/min): 20 Blood Pressure (mmHg): 147/44 Reference Range: 80 - 120 mg / dl Electronic Signature(s) Signed: 12/23/2019 11:15:54 AM By: Darci Needle Entered By: Darci Needle on 12/23/2019 10:38:32

## 2019-12-30 ENCOUNTER — Other Ambulatory Visit: Payer: Self-pay

## 2019-12-30 ENCOUNTER — Encounter: Payer: Medicare HMO | Admitting: Physician Assistant

## 2019-12-30 DIAGNOSIS — E11621 Type 2 diabetes mellitus with foot ulcer: Secondary | ICD-10-CM | POA: Diagnosis not present

## 2019-12-31 NOTE — Progress Notes (Addendum)
Melinda [name], Melinda [name] ([id number]) Visit Report for 12/30/2019 Arrival Information Details Patient Name: Melinda [name], Melinda [name] Date of Service: 12/30/2019 11:00 AM Medical Record Number: [id number] Patient Account Number: [id number] Date of Birth/Sex: Sep 28, 1955 (64 y.o. F) Treating RN: [name] Primary Care Samiyah Stupka: [name] Other Clinician: Referring Nolyn Swab: [name] Treating Jancarlo Biermann/Extender: [name], [name]: 2 Visit Information History Since Last Visit Added or deleted any medications: No Patient Arrived: Ambulatory Any new allergies or adverse reactions: No Arrival Time: 11:05 Had a fall or experienced change in No Accompanied By: self activities of daily living that may affect Transfer Assistance: None risk of falls: Patient Identification Verified: Yes Signs or symptoms of abuse/neglect since last visito No Secondary Verification Process Completed: Yes Hospitalized since last visit: No Patient Requires Transmission-Based No Implantable device outside of the clinic excluding No Precautions: cellular tissue based products placed in the center Patient Has Alerts: Yes since last visit: Patient Alerts: 10/28/19 ABI: R 1.05 Has Dressing in Place as Prescribed: Yes L .60 Pain Present Now: Yes Electronic Signature(s) Signed: 12/31/2019 3:55:47 PM By: Lorine Bears RCP, RRT, CHT Entered By: Lorine Bears on 12/30/2019 11:06:11 Melinda [name] ([id number]) -------------------------------------------------------------------------------- Clinic Level of Care Assessment Details Patient Name: Melinda [name] Date of Service: 12/30/2019 11:00 AM Medical Record Number: [id number] Patient Account Number: [id number] Date of Birth/Sex: [birth year] (64 y.o. F) Treating RN: [name] Primary Care Zarian Colpitts: [name] Other Clinician: Referring Primo Innis: [name] Treating Ciro Tashiro/Extender: [name], [name]:  2 Clinic Level of Care Assessment Items TOOL 4 Quantity Score []  - Use when only an EandM is performed on FOLLOW-UP visit 0 ASSESSMENTS - Nursing Assessment / Reassessment X - Reassessment of Co-morbidities (includes updates in patient status) 1 10 X- 1 5 Reassessment of Adherence to Treatment Plan ASSESSMENTS - Wound and Skin Assessment / Reassessment X - Simple Wound Assessment / Reassessment - one wound 1 5 []  - 0 Complex Wound Assessment / Reassessment - multiple wounds []  - 0 Dermatologic / Skin Assessment (not related to wound area) ASSESSMENTS - Focused Assessment X - Circumferential Edema Measurements - multi extremities 1 5 []  - 0 Nutritional Assessment / Counseling / Intervention X- 1 5 Lower Extremity Assessment (monofilament, tuning fork, pulses) []  - 0 Peripheral Arterial Disease Assessment (using hand held doppler) ASSESSMENTS - Ostomy and/or Continence Assessment and Care []  - Incontinence Assessment and Management 0 []  - 0 Ostomy Care Assessment and Management (repouching, etc.) PROCESS - Coordination of Care X - Simple Patient / Family Education for ongoing care 1 15 []  - 0 Complex (extensive) Patient / Family Education for ongoing care []  - 0 Staff obtains Programmer, systems, Records, Test Results / Process Orders []  - 0 Staff telephones HHA, Nursing Homes / Clarify orders / etc []  - 0 Routine Transfer to another Facility (non-emergent condition) []  - 0 Routine Hospital Admission (non-emergent condition) []  - 0 New Admissions / Biomedical engineer / Ordering NPWT, Apligraf, etc. []  - 0 Emergency Hospital Admission (emergent condition) X- 1 10 Simple Discharge Coordination []  - 0 Complex (extensive) Discharge Coordination PROCESS - Special Needs []  - Pediatric / Minor Patient Management 0 []  - 0 Isolation Patient Management []  - 0 Hearing / Language / Visual special needs []  - 0 Assessment of Community assistance (transportation, D/C planning,  etc.) []  - 0 Additional assistance / Altered mentation []  - 0 Support Surface(s) Assessment (bed, cushion, seat, etc.) INTERVENTIONS - Wound Cleansing / Measurement Bortle, Marillyn E. ([id number]) X-  1 5 Simple Wound Cleansing - one wound []  - 0 Complex Wound Cleansing - multiple wounds []  - 0 Wound Imaging (photographs - any number of wounds) []  - 0 Wound Tracing (instead of photographs) X- 1 5 Simple Wound Measurement - one wound []  - 0 Complex Wound Measurement - multiple wounds INTERVENTIONS - Wound Dressings X - Small Wound Dressing one or multiple wounds 1 10 []  - 0 Medium Wound Dressing one or multiple wounds []  - 0 Large Wound Dressing one or multiple wounds []  - 0 Application of Medications - topical []  - 0 Application of Medications - injection INTERVENTIONS - Miscellaneous []  - External ear exam 0 []  - 0 Specimen Collection (cultures, biopsies, blood, body fluids, etc.) []  - 0 Specimen(s) / Culture(s) sent or taken to Lab for analysis []  - 0 Patient Transfer (multiple staff / Civil Service fast streamer / Similar devices) []  - 0 Simple Staple / Suture removal (25 or less) []  - 0 Complex Staple / Suture removal (26 or more) []  - 0 Hypo / Hyperglycemic Management (close monitor of Blood Glucose) []  - 0 Ankle / Brachial Index (ABI) - do not check if billed separately X- 1 5 Vital Signs Has the patient been seen at the hospital within the last three years: Yes Total Score: 80 Level Of Care: New/Established - Level 3 Electronic Signature(s) Signed: 12/30/2019 4:32:20 PM By: [name] Entered By: [name] on 12/30/2019 11:34:45 Melinda [name] ([id number]) -------------------------------------------------------------------------------- Encounter Discharge Information Details Patient Name: Melinda [name] Date of Service: 12/30/2019 11:00 AM Medical Record Number: [id number] Patient Account Number: [id number] Date of Birth/Sex: [birth year] (64 y.o. F) Treating  RN: [name] Primary Care Julya Alioto: [name] Other Clinician: Referring Tima Curet: [name] Treating France Noyce/Extender: [name], [name]: 2 Encounter Discharge Information Items Discharge Condition: Stable Ambulatory Status: Ambulatory Discharge Destination: Home Transportation: Private Auto Accompanied By: self Schedule Follow-up Appointment: Yes Clinical Summary of Care: Electronic Signature(s) Signed: 12/30/2019 4:32:20 PM By: [name] Entered By: [name] on 12/30/2019 11:35:50 Melinda [name] ([id number]) -------------------------------------------------------------------------------- Lower Extremity Assessment Details Patient Name: Melinda [name] Date of Service: 12/30/2019 11:00 AM Medical Record Number: [id number] Patient Account Number: [id number] Date of Birth/Sex: 1955/05/09 (64 y.o. F) Treating RN: [name] Primary Care Seda Kronberg: [name] Other Clinician: Referring Nomar Broad: [name] Treating Jaqualyn Juday/Extender: [name], [name]: 2 Edema Assessment Assessed: [Left: No] [Right: No] [Left: Edema] [Right: :] Calf Left: Right: Point of Measurement: 33 cm From Medial Instep 35 cm cm Ankle Left: Right: Point of Measurement: 12 cm From Medial Instep 25 cm cm Vascular Assessment Pulses: Dorsalis Pedis Palpable: [Left:Yes] Posterior Tibial Palpable: [Left:Yes] Electronic Signature(s) Signed: 12/30/2019 11:30:22 AM By: Darci Needle Signed: 12/31/2019 11:13:21 AM By: [name], [name], RN, [name], [name] RN, [name] Entered By: Darci Needle on 12/30/2019 11:21:13 Melinda [name] ([id number]) -------------------------------------------------------------------------------- Multi Wound Chart Details Patient Name: Melinda [name] Date of Service: 12/30/2019 11:00 AM Medical Record Number: [id number] Patient Account Number: [id number] Date of Birth/Sex: 07/09/1955 (64 y.o. 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 Comorbid History: Cataracts, Coronary Artery Disease, N/A N/A Hypertension, Myocardial Infarction,  Peripheral Venous Disease, Type II Diabetes, Osteoarthritis, Osteomyelitis, Neuropathy Date Acquired: 12/02/2019 N/A N/A Weeks of Treatment: 2 N/A N/A Wound Status: Open N/A N/A Measurements L x W x D (cm) 0.2x0.5x0.2 N/A N/A Area (cm) : 0.079 N/A N/A Volume (cm) : 0.016 N/A N/A % Reduction in Area: 96.90% N/A N/A % Reduction in Volume: 99.30% N/A N/A Classification: Grade 2 N/A N/A Exudate Amount: Medium N/A N/A Exudate Type: Serosanguineous N/A N/A Exudate Color: red, brown N/A N/A Wound Margin: Flat and Intact N/A N/A Granulation Amount: Small (1-33%) N/A N/A Granulation Quality: Red N/A N/A Necrotic Amount: Medium (34-66%) N/A N/A Exposed Structures: Fat Layer (Subcutaneous Tissue): N/A N/A Yes Fascia: No Tendon: No Muscle: No Joint: No Bone: No Treatment Notes Electronic Signature(s) Signed: 12/30/2019 4:32:20 PM By: [name] Entered By: [name] on 12/30/2019 11:30:57 Melinda [name] ([id number]) -------------------------------------------------------------------------------- Multi-Disciplinary Care Plan Details Patient Name: Melinda [name] Date of Service: 12/30/2019 11:00 AM Medical Record Number: [id number] Patient Account Number: [id number] Date of Birth/Sex: 1956-02-08 (64 y.o. F) Treating RN: [name] Primary Care Kharter Sestak: [name] Other Clinician: Referring Shima Compere: [name] Treating Samnang Shugars/Extender: [name], [name]: 2 Active Inactive Necrotic Tissue Nursing Diagnoses: Impaired tissue integrity related to necrotic/devitalized tissue Goals: Necrotic/devitalized tissue will be minimized in the wound bed Date Initiated: 12/16/2019 Target Resolution Date: 12/26/2019 Goal Status: Active Interventions: Assess patient pain level pre-, during and post procedure and prior to discharge Treatment Activities: Excisional debridement : 12/16/2019 Notes: Orientation to the Wound Care Program Nursing Diagnoses: Knowledge deficit related to the wound healing center program Goals: Patient/caregiver will verbalize understanding of the [place] Date Initiated: 12/16/2019 Target Resolution Date: 12/26/2019 Goal Status: Active Interventions: Provide education on orientation to the wound center Notes: Peripheral Neuropathy Nursing Diagnoses: Knowledge deficit related to disease process and management of peripheral neurovascular dysfunction Potential alteration in peripheral tissue perfusion (select prior to confirmation of diagnosis) Goals: Patient/caregiver will verbalize understanding of disease process and disease management Date Initiated: 12/16/2019 Target Resolution Date: 12/26/2019 Goal Status: Active Interventions: Provide education on Management of Neuropathy and Related Ulcers Treatment Activities: Patient referred for customized footwear/offloading : 12/16/2019 Notes: Pressure Nursing Diagnoses: MORAYO, Melinda ([id number]) Knowledge deficit related to causes and risk factors for pressure ulcer development Knowledge deficit related to management of pressures ulcers Goals: Patient will remain free from development of additional pressure ulcers Date Initiated: 12/16/2019 Target Resolution Date: 12/26/2019 Goal Status: Active Interventions: Provide education on  pressure ulcers Notes: Wound/Skin Impairment Nursing Diagnoses: Impaired tissue integrity Goals: Ulcer/skin breakdown will have a volume reduction of 30% by week 4 Date Initiated: 12/16/2019 Target Resolution Date: 01/16/2020 Goal Status: Active Interventions: Assess patient/caregiver ability to obtain necessary supplies Assess ulceration(s) every visit Treatment Activities: Skin care regimen initiated : 12/16/2019 Notes: Electronic Signature(s) Signed: 12/30/2019 4:32:20 PM By: [name] Entered By: [name] on 12/30/2019 11:30:50 Melinda [name] ([id number]) -------------------------------------------------------------------------------- Pain Assessment Details Patient Name: Melinda [name] Date of Service: 12/30/2019 11:00 AM Medical Record Number: [id number] Patient Account Number: [id number] Date of Birth/Sex: 1956/03/18 (64 y.o. F) Treating RN: [name] Primary Care Torry Adamczak: [name] Other Clinician: Referring Jobe Mutch: [name] Treating Cheynne Virden/Extender: [name], [name]: 2 Active Problems Location of Pain Severity and Description of Pain Patient Has Paino Yes Site Locations Pain Location: Pain in Ulcers With Dressing Change: No Duration of the Pain. Constant / Intermittento Constant Rate the pain. Current Pain Level: 6 Worst Pain Level: 10 Least  Pain Level: 2 Tolerable Pain Level: 5 Character of Pain Describe the Pain: Difficult to Pinpoint Pain Management and Medication Current Pain Management: How does your wound impact your activities of daily livingo Sleep: Yes Notes States she has pain all the time due to neuropathy. Takes Lyrica with relief. Electronic Signature(s) Signed: 12/30/2019 11:30:22 AM By: Darci Needle Signed: 12/31/2019 11:13:21 AM By: [name], [name], RN, [name], [name] RN, [name] Entered By: Darci Needle on 12/30/2019 11:15:22 Melinda [name]  ([id number]) -------------------------------------------------------------------------------- Patient/Caregiver Education Details Patient Name: Melinda [name], Melinda [name] Date of Service: 12/30/2019 11:00 AM Medical Record Number: [id number] Patient Account Number: [id number] Date of Birth/Gender: Apr 17, 1956 (64 y.o. F) Treating RN: [name] Primary Care Physician: [name] Other Clinician: Referring Physician: [name] Treating Physician/Extender: Sharalyn Ink in Treatment: 2 Education Assessment Education Provided To: Patient Education Topics Provided Wound/Skin Impairment: Handouts: Caring for Your Ulcer Methods: Explain/Verbal Responses: State content correctly Electronic Signature(s) Signed: 12/30/2019 4:32:20 PM By: [name] Entered By: [name] on 12/30/2019 11:35:06 Melinda [name] ([id number]) -------------------------------------------------------------------------------- Wound Assessment Details Patient Name: Melinda [name] Date of Service: 12/30/2019 11:00 AM Medical Record Number: [id number] Patient Account Number: [id number] Date of Birth/Sex: February 10, 1956 (64 y.o. F) Treating RN: [name] Primary Care Eliyah Bazzi: [name] Other Clinician: Referring Hubbard Seldon: [name] Treating Delsy Etzkorn/Extender: [name], [name]: 2 Wound Status Wound Number: 5 Primary Diabetic Wound/Ulcer of the Lower Extremity Etiology: Wound Location: Left, Plantar Toe Great Wound Open Wounding Event: Gradually Appeared Status: Date Acquired: 12/02/2019 Comorbid Cataracts, Coronary Artery Disease, Hypertension, Weeks Of Treatment: 2 History: Myocardial Infarction, Peripheral Venous Disease, Type II Clustered Wound: No Diabetes, Osteoarthritis, Osteomyelitis, Neuropathy Photos Wound Measurements Length: (cm) 0.2 Width: (cm) 0.5 Depth: (cm) 0.2 Area: (cm) 0.079 Volume: (cm) 0.016 % Reduction in Area: 96.9% % Reduction in Volume:  99.3% Wound Description Classification: Grade 2 F Wound Margin: Flat and Intact S Exudate Amount: Medium Exudate Type: Serosanguineous Exudate Color: red, brown oul Odor After Cleansing: No lough/Fibrino Yes Wound Bed Granulation Amount: Small (1-33%) Exposed Structure Granulation Quality: Red Fascia Exposed: No Necrotic Amount: Medium (34-66%) Fat Layer (Subcutaneous Tissue) Exposed: Yes Necrotic Quality: Adherent Slough Tendon Exposed: No Muscle Exposed: No Joint Exposed: No Bone Exposed: No Treatment Notes Wound #5 (Left, Plantar Toe Great) Notes SILver alginate, gause and conform, peg assist shoe Electronic Signature(s) SHAHAD, MAZUREK ([id number]) Signed: 12/30/2019 11:30:22 AM By: Darci Needle Signed: 12/31/2019 11:13:21 AM By: [name], [name], RN, [name], [name] RN, [name] Entered By: Darci Needle on 12/30/2019 11:19:40 Melinda [name] ([id number]) -------------------------------------------------------------------------------- Vitals Details Patient Name: Melinda [name] Date of Service: 12/30/2019 11:00 AM Medical Record Number: [id number] Patient Account Number: [id number] Date of Birth/Sex: November 23, 1955 (64 y.o. F) Treating RN: [name] Primary Care Tanayia Wahlquist: [name] Other Clinician: Referring Florencio Hollibaugh: [name] Treating Kjell Brannen/Extender: [name], [name]: 2 Vital Signs Time Taken: 11:05 Temperature (F): 98.2 Height (in): 69 Pulse (bpm): 87 Source: Stated Respiratory Rate (breaths/min): 16 Blood Pressure (mmHg): 102/53 Reference Range: 80 - 120 mg / dl Electronic Signature(s) Signed: 12/31/2019 3:55:47 PM By: Lorine Bears RCP, RRT, CHT Entered By: Lorine Bears on 12/30/2019 11:07:18

## 2020-01-01 NOTE — Progress Notes (Addendum)
AYDE, RECORD ([id number]) Visit Report for 12/30/2019 Chief Complaint Document Details Patient Name: Melinda [name], Melinda [name] Date of Service: 12/30/2019 11:00 AM Medical Record Number: [id number] Patient Account Number: [id number] Date of Birth/Sex: Apr 27, 1956 (64 y.o. F) Treating RN: [name] Primary Care Provider: [name] Other Clinician: Referring Provider: [name] Treating Provider/Extender: [name], Lamonica Trueba Weeks in Treatment: 2 Information Obtained from: Patient Chief Complaint Left foot ulcer Electronic Signature(s) Signed: 01/01/2020 8:34:21 AM By: [name] [name] Entered By: [name] on 01/01/2020 08:34:21 Melinda [name] ([id number]) -------------------------------------------------------------------------------- Physician Orders Details Patient Name: Melinda [name] Date of Service: 12/30/2019 11:00 AM Medical Record Number: [id number] Patient Account Number: [id number] Date of Birth/Sex: 28-Nov-1955 (64 y.o. F) Treating RN: [name] Primary Care Provider: [name] Other Clinician: Referring Provider: [name] Treating Provider/Extender: [name], Lorea Kupfer Weeks in Treatment: 2 Verbal / Phone Orders: No Diagnosis Coding Wound Cleansing Wound #5 Left,Plantar Toe Great o Clean wound with Normal Saline. Anesthetic (add to Medication List) Wound #5 Left,Plantar Toe Great o Topical Lidocaine 4% cream applied to wound bed prior to debridement (In Clinic Only). Primary Wound Dressing Wound #5 Left,Plantar Toe Great o Silver Alginate Secondary Dressing Wound #5 Left,Plantar Toe Great o Gauze and Kerlix/Conform Dressing Change Frequency Wound #5 Left,Plantar Toe Great o Three times weekly Follow-up Appointments Wound #5 Left,Plantar Toe Great o Return Appointment in 2 weeks. Off-Loading Wound #5 Left,Plantar Toe Great o Open toe surgical shoe with peg assist. Additional Orders / Instructions Wound #5 Left,Plantar Toe  Great o Increase protein intake. o Activity as tolerated Electronic Signature(s) Signed: 12/30/2019 4:32:20 PM By: [name] Signed: 12/30/2019 5:22:17 PM By: [name] [name] Entered By: [name] on 12/30/2019 11:32:52 Melinda [name] ([id number]) -------------------------------------------------------------------------------- Problem List Details Patient Name: Melinda [name] Date of Service: 12/30/2019 11:00 AM Medical Record Number: [id number] Patient Account Number: [id number] Date of Birth/Sex: 12-16-1955 (64 y.o. F) Treating RN: [name] Primary Care Provider: [name] Other Clinician: Referring Provider: [name] Treating Provider/Extender: [name], Gwendoline Judy Weeks in Treatment: 2 Active Problems ICD-10 Encounter Code Description Active Date MDM Diagnosis E11.621 Type 2 diabetes mellitus with foot ulcer 12/16/2019 No Yes L97.522 Non-pressure chronic ulcer of other part of left foot with fat layer 12/16/2019 No Yes exposed I73.89 Other specified peripheral vascular diseases 12/16/2019 No Yes E11.51 Type 2 diabetes mellitus with diabetic peripheral angiopathy without 12/16/2019 No Yes gangrene L84 Corns and callosities 12/16/2019 No Yes Inactive Problems Resolved Problems Electronic Signature(s) Signed: 01/01/2020 8:34:15 AM By: [name] [name] Entered By: [name] on 01/01/2020 08:34:14 Melinda [name] ([id number]) -------------------------------------------------------------------------------- SuperBill Details Patient Name: Melinda [name] Date of Service: 12/30/2019 Medical Record Number: [id number] Patient Account Number: [id number] Date of Birth/Sex: 18-Oct-1955 (64 y.o. F) Treating RN: [name] Primary Care Provider: [name] Other Clinician: Referring Provider: [name] Treating Provider/Extender: [name], Malaisha Silliman Weeks in Treatment: 2 Diagnosis Coding ICD-10 Codes Code Description E11.621 Type 2 diabetes  mellitus with foot ulcer 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sed I73.89 Other specified peripheral vascular diseases E11.51 Type 2 diabetes mellitus with diabetic peripheral angiopathy without gangrene L84 Corns and callosities Facility Procedures CPT4 Code: [id number] Description: 91638 - WOUND CARE VISIT-LEV 3 EST PT Modifier: Quantity: 1 Electronic Signature(s) Signed: 12/30/2019 4:32:20 PM By: [name] Signed: 12/30/2019 5:22:17 PM By: [name] [name] Entered By: [name] on 12/30/2019 11:34:55

## 2020-01-06 ENCOUNTER — Encounter: Payer: Medicare HMO | Admitting: Physician Assistant

## 2020-01-13 ENCOUNTER — Other Ambulatory Visit: Payer: Self-pay

## 2020-01-13 ENCOUNTER — Encounter: Payer: Medicare HMO | Attending: Physician Assistant | Admitting: Physician Assistant

## 2020-01-13 DIAGNOSIS — L84 Corns and callosities: Secondary | ICD-10-CM | POA: Diagnosis not present

## 2020-01-13 DIAGNOSIS — L97522 Non-pressure chronic ulcer of other part of left foot with fat layer exposed: Secondary | ICD-10-CM | POA: Insufficient documentation

## 2020-01-13 DIAGNOSIS — Z89421 Acquired absence of other right toe(s): Secondary | ICD-10-CM | POA: Insufficient documentation

## 2020-01-13 DIAGNOSIS — I252 Old myocardial infarction: Secondary | ICD-10-CM | POA: Insufficient documentation

## 2020-01-13 DIAGNOSIS — E11621 Type 2 diabetes mellitus with foot ulcer: Secondary | ICD-10-CM | POA: Insufficient documentation

## 2020-01-13 DIAGNOSIS — Z951 Presence of aortocoronary bypass graft: Secondary | ICD-10-CM | POA: Insufficient documentation

## 2020-01-13 DIAGNOSIS — L97512 Non-pressure chronic ulcer of other part of right foot with fat layer exposed: Secondary | ICD-10-CM | POA: Diagnosis not present

## 2020-01-13 DIAGNOSIS — Z89411 Acquired absence of right great toe: Secondary | ICD-10-CM | POA: Insufficient documentation

## 2020-01-13 DIAGNOSIS — E1142 Type 2 diabetes mellitus with diabetic polyneuropathy: Secondary | ICD-10-CM | POA: Insufficient documentation

## 2020-01-13 DIAGNOSIS — Z794 Long term (current) use of insulin: Secondary | ICD-10-CM | POA: Insufficient documentation

## 2020-01-13 DIAGNOSIS — I2510 Atherosclerotic heart disease of native coronary artery without angina pectoris: Secondary | ICD-10-CM | POA: Diagnosis not present

## 2020-01-13 DIAGNOSIS — I10 Essential (primary) hypertension: Secondary | ICD-10-CM | POA: Insufficient documentation

## 2020-01-13 DIAGNOSIS — Z89422 Acquired absence of other left toe(s): Secondary | ICD-10-CM | POA: Insufficient documentation

## 2020-01-13 DIAGNOSIS — F17200 Nicotine dependence, unspecified, uncomplicated: Secondary | ICD-10-CM | POA: Diagnosis not present

## 2020-01-13 DIAGNOSIS — E669 Obesity, unspecified: Secondary | ICD-10-CM | POA: Diagnosis not present

## 2020-01-13 DIAGNOSIS — E1151 Type 2 diabetes mellitus with diabetic peripheral angiopathy without gangrene: Secondary | ICD-10-CM | POA: Insufficient documentation

## 2020-01-13 NOTE — Progress Notes (Addendum)
DEETTA, SIEGMANN ([id number]) Visit Report for 01/13/2020 Arrival Information Details Patient Name: Melinda [name], Melinda [name] Date of Service: 01/13/2020 11:00 AM Medical Record Number: [id number] Patient Account Number: [id number] Date of Birth/Sex: [birth year] (64 y.o. F) Treating RN: [name] Primary Care Leilanie Rauda: [name] Other Clinician: Referring Betania Dizon: [name] Treating Shakira Los/Extender: [name], [name]: 4 Visit Information History Since Last Visit Added or deleted any medications: No Patient Arrived: Ambulatory Any new allergies or adverse reactions: No Arrival Time: 11:09 Had a fall or experienced change in No Accompanied By: self activities of daily living that may affect Transfer Assistance: None risk of falls: Patient Identification Verified: Yes Signs or symptoms of abuse/neglect since last visito No Secondary Verification Process Completed: Yes Hospitalized since last visit: No Patient Requires Transmission-Based No Implantable device outside of the clinic excluding No Precautions: cellular tissue based products placed in the center Patient Has Alerts: Yes since last visit: Patient Alerts: 10/28/19 ABI: R 1.05 Has Dressing in Place as Prescribed: Yes L .60 Pain Present Now: No Electronic Signature(s) Signed: 01/13/2020 11:44:31 AM By: Lorine Bears RCP, RRT, CHT Entered By: Lorine Bears on 01/13/2020 11:10:32 Melinda [name] ([id number]) -------------------------------------------------------------------------------- Encounter Discharge Information Details Patient Name: Melinda [name] Date of Service: 01/13/2020 11:00 AM Medical Record Number: [id number] Patient Account Number: [id number] Date of Birth/Sex: Oct 31, 1955 (64 y.o. F) Treating RN: [name] Primary Care Merry Pond: [name] Other Clinician: Referring Hazel Leveille: [name] Treating Suzi Hernan/Extender: [name], [name] Weeks in  Treatment: 4 Encounter Discharge Information Items Post Procedure Vitals Discharge Condition: Stable Temperature (F): 97.7 Ambulatory Status: Ambulatory Pulse (bpm): 72 Discharge Destination: Home Respiratory Rate (breaths/min): 18 Transportation: Private Auto Blood Pressure (mmHg): 125/72 Accompanied By: self Schedule Follow-up Appointment: Yes Clinical Summary of Care: Electronic Signature(s) Signed: 01/13/2020 4:44:41 PM By: [name] Entered By: [name] on 01/13/2020 11:42:02 Melinda [name] ([id number]) -------------------------------------------------------------------------------- Lower Extremity Assessment Details Patient Name: Melinda [name] Date of Service: 01/13/2020 11:00 AM Medical Record Number: [id number] Patient Account Number: [id number] Date of Birth/Sex: [birth year] (64 y.o. F) Treating RN: [name] Primary Care Robyn Galati: [name] Other Clinician: Referring Giani Betzold: [name] Treating Emmaleigh Longo/Extender: [name], [name]: 4 Edema Assessment Assessed: [Left: No] [Right: No] [Left: Edema] [Right: :] Calf Left: Right: Point of Measurement: 33 cm From Medial Instep 37 cm cm Ankle Left: Right: Point of Measurement: 12 cm From Medial Instep 24.5 cm cm Vascular Assessment Pulses: Dorsalis Pedis Palpable: [Left:Yes] Posterior Tibial Palpable: [Left:Yes] Electronic Signature(s) Signed: 01/13/2020 11:49:13 AM By: Darci Needle Signed: 01/13/2020 4:44:41 PM By: [name] Entered By: Darci Needle on 01/13/2020 11:21:09 Melinda [name] ([id number]) -------------------------------------------------------------------------------- Multi Wound Chart Details Patient Name: Melinda [name] Date of Service: 01/13/2020 11:00 AM Medical Record Number: [id number] Patient Account Number: [id number] Date of Birth/Sex: [birth year] (64 y.o. F) Treating RN: [name] Primary Care Gabe Glace: [name] Other  Clinician: Referring Ariela Mochizuki: [name] Treating Halyn Flaugher/Extender: [name], [name]: 4 Vital Signs Height(in): 28 Pulse(bpm): 90 Weight(lbs): Blood Pressure(mmHg): 125/72 Body Mass Index(BMI): Temperature(F): 97.7 Respiratory Rate(breaths/min): 18 Photos: [N/A:N/A] Wound Location: Left, Plantar Toe Great N/A N/A Wounding Event: Gradually Appeared N/A N/A Primary Etiology: Diabetic Wound/Ulcer of the Lower N/A N/A Extremity Comorbid History: Cataracts, Coronary Artery Disease, N/A N/A Hypertension, Myocardial Infarction, Peripheral Venous Disease, Type II Diabetes, Osteoarthritis, Osteomyelitis, Neuropathy Date Acquired: 12/02/2019 N/A N/A Weeks of Treatment: 4 N/A N/A Wound Status: Open N/A N/A Measurements L x W x D (cm)  0.2x0.2x0.1 N/A N/A Area (cm) : 0.031 N/A N/A Volume (cm) : 0.003 N/A N/A % Reduction in Area: 98.80% N/A N/A % Reduction in Volume: 99.90% N/A N/A Classification: Grade 2 N/A N/A Exudate Amount: Medium N/A N/A Exudate Type: Serosanguineous N/A N/A Exudate Color: red, brown N/A N/A Wound Margin: Flat and Intact N/A N/A Granulation Amount: Small (1-33%) N/A N/A Granulation Quality: Red N/A N/A Necrotic Amount: Medium (34-66%) N/A N/A Exposed Structures: Fat Layer (Subcutaneous Tissue): N/A N/A Yes Fascia: No Tendon: No Muscle: No Joint: No Bone: No Treatment Notes Electronic Signature(s) Signed: 01/13/2020 4:44:41 PM By: [name] Entered By: [name] on 01/13/2020 11:38:51 Melinda [name] ([id number]) -------------------------------------------------------------------------------- Multi-Disciplinary Care Plan Details Patient Name: Melinda [name] Date of Service: 01/13/2020 11:00 AM Medical Record Number: [id number] Patient Account Number: [id number] Date of Birth/Sex: [birth year] (64 y.o. F) Treating RN: [name] Primary Care Laketha Leopard: [name] Other Clinician: Referring Jaiya Mooradian: [name] Treating Nyna Chilton/Extender: [name], [name]: 4 Active Inactive Necrotic Tissue Nursing Diagnoses: Impaired tissue integrity related to necrotic/devitalized tissue Goals: Necrotic/devitalized tissue will be minimized in the wound bed Date Initiated: 12/16/2019 Target Resolution Date: 12/26/2019 Goal Status: Active Interventions: Assess patient pain level pre-, during and post procedure and prior to discharge Treatment Activities: Excisional debridement : 12/16/2019 Notes: Orientation to the Wound Care Program Nursing Diagnoses: Knowledge deficit related to the wound healing center program Goals: Patient/caregiver will verbalize understanding of the [place] Date Initiated: 12/16/2019 Target Resolution Date: 12/26/2019 Goal Status: Active Interventions: Provide education on orientation to the wound center Notes: Peripheral Neuropathy Nursing Diagnoses: Knowledge deficit related to disease process and management of peripheral neurovascular dysfunction Potential alteration in peripheral tissue perfusion (select prior to confirmation of diagnosis) Goals: Patient/caregiver will verbalize understanding of disease process and disease management Date Initiated: 12/16/2019 Target Resolution Date: 12/26/2019 Goal Status: Active Interventions: Provide education on Management of Neuropathy and Related Ulcers Treatment Activities: Patient referred for customized footwear/offloading : 12/16/2019 Notes: Pressure Nursing Diagnoses: Melinda [name], Melinda [name] ([id number]) Knowledge deficit related to causes and risk factors for pressure ulcer development Knowledge deficit related to management of pressures ulcers Goals: Patient will remain free from development of additional pressure ulcers Date Initiated: 12/16/2019 Target Resolution Date: 12/26/2019 Goal Status: Active Interventions: Provide education on pressure ulcers Notes: Wound/Skin  Impairment Nursing Diagnoses: Impaired tissue integrity Goals: Ulcer/skin breakdown will have a volume reduction of 30% by week 4 Date Initiated: 12/16/2019 Target Resolution Date: 01/16/2020 Goal Status: Active Interventions: Assess patient/caregiver ability to obtain necessary supplies Assess ulceration(s) every visit Treatment Activities: Skin care regimen initiated : 12/16/2019 Notes: Electronic Signature(s) Signed: 01/13/2020 4:44:41 PM By: [name] Entered By: [name] on 01/13/2020 11:38:40 Melinda [name] ([id number]) -------------------------------------------------------------------------------- Pain Assessment Details Patient Name: Melinda [name] Date of Service: 01/13/2020 11:00 AM Medical Record Number: [id number] Patient Account Number: [id number] Date of Birth/Sex: 1955/06/14 (64 y.o. F) Treating RN: [name] Primary Care Symphany Fleissner: [name] Other Clinician: Referring Bertin Inabinet: [name] Treating Juelz Whittenberg/Extender: [name], [name]: 4 Active Problems Location of Pain Severity and Description of Pain Patient Has Paino No Site Locations With Dressing Change: No Pain Management and Medication Current Pain Management: Electronic Signature(s) Signed: 01/13/2020 11:49:13 AM By: Darci Needle Signed: 01/13/2020 4:44:41 PM By: [name] Entered By: Darci Needle on 01/13/2020 11:18:35 Melinda [name] ([id number]) -------------------------------------------------------------------------------- Patient/Caregiver Education Details Patient Name: Melinda [name] Date of Service: 01/13/2020 11:00 AM Medical Record Number: [id number] Patient Account Number: [id number]  Date of Birth/Gender: [birth year] (64 y.o. F) Treating RN: [name] Primary Care Physician: [name] Other Clinician: Referring Physician: [name] Treating Physician/Extender: Sharalyn Ink in Treatment: 4 Education  Assessment Education Provided To: Patient Education Topics Provided Wound/Skin Impairment: Handouts: Caring for Your Ulcer Methods: Explain/Verbal Responses: State content correctly Electronic Signature(s) Signed: 01/13/2020 4:44:41 PM By: [name] Entered By: [name] on 01/13/2020 11:40:56 Melinda [name] ([id number]) -------------------------------------------------------------------------------- Wound Assessment Details Patient Name: Melinda [name] Date of Service: 01/13/2020 11:00 AM Medical Record Number: [id number] Patient Account Number: [id number] Date of Birth/Sex: November 05, 1955 (64 y.o. F) Treating RN: [name] Primary Care Kealey Kemmer: [name] Other Clinician: Referring Trayden Brandy: [name] Treating Maicol Bowland/Extender: [name], [name]: 4 Wound Status Wound Number: 5 Primary Diabetic Wound/Ulcer of the Lower Extremity Etiology: Wound Location: Left, Plantar Toe Great Wound Open Wounding Event: Gradually Appeared Status: Date Acquired: 12/02/2019 Comorbid Cataracts, Coronary Artery Disease, Hypertension, Weeks Of Treatment: 4 History: Myocardial Infarction, Peripheral Venous Disease, Type II Clustered Wound: No Diabetes, Osteoarthritis, Osteomyelitis, Neuropathy Photos Wound Measurements Length: (cm) 0.2 Width: (cm) 0.2 Depth: (cm) 0.1 Area: (cm) 0.031 Volume: (cm) 0.003 % Reduction in Area: 98.8% % Reduction in Volume: 99.9% Wound Description Classification: Grade 2 F Wound Margin: Flat and Intact S Exudate Amount: Medium Exudate Type: Serosanguineous Exudate Color: red, brown oul Odor After Cleansing: No lough/Fibrino Yes Wound Bed Granulation Amount: Small (1-33%) Exposed Structure Granulation Quality: Red Fascia Exposed: No Necrotic Amount: Medium (34-66%) Fat Layer (Subcutaneous Tissue) Exposed: Yes Necrotic Quality: Adherent Slough Tendon Exposed: No Muscle Exposed: No Joint Exposed: No Bone  Exposed: No Treatment Notes Wound #5 (Left, Plantar Toe Great) Notes SILver alginate, gause and conform, peg assist shoe Electronic Signature(s) Melinda [name], Melinda [name] ([id number]) Signed: 01/13/2020 11:49:13 AM By: Darci Needle Signed: 01/13/2020 4:44:41 PM By: [name] Entered By: Darci Needle on 01/13/2020 11:19:42 Melinda [name] ([id number]) -------------------------------------------------------------------------------- Vitals Details Patient Name: Melinda [name] Date of Service: 01/13/2020 11:00 AM Medical Record Number: [id number] Patient Account Number: [id number] Date of Birth/Sex: 16-Jun-1955 (64 y.o. F) Treating RN: [name] Primary Care Montzerrat Brunell: [name] Other Clinician: Referring Sheliah Fiorillo: [name] Treating Tandi Hanko/Extender: [name], [name]: 4 Vital Signs Time Taken: 11:05 Temperature (F): 97.7 Height (in): 69 Pulse (bpm): 90 Respiratory Rate (breaths/min): 18 Blood Pressure (mmHg): 125/72 Reference Range: 80 - 120 mg / dl Electronic Signature(s) Signed: 01/13/2020 11:44:31 AM By: Lorine Bears RCP, RRT, CHT Entered By: Lorine Bears on 01/13/2020 11:11:03
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
## 2020-01-20 ENCOUNTER — Encounter: Payer: Medicare HMO | Admitting: Physician Assistant

## 2020-01-21 NOTE — H&P (Addendum)
Chief Complaint:  morbid obesity and diabetes-BMI 31  History of Present Illness:  Melinda [name] is an 64 y.o.AA female who was seen by me for bariatric surgery for morbid obesity and comorbidities incl type 2 diabetes requiring insulin.  She has a history of arteriosclerotic CV disease, obstructive sleep apnea, arthritis, peripheral vascular disease and 3 toe amputations on the left.  She has not had any abdominal surgey.    She also has GERD, hypercholesterosis, and history of ischemic cardiomyopathy.    When we met in the office we discussed bariatric options and I think that the gastric bypass to help her not only lose weight but hopefully improve her DM control is her best option.  I saw her in the office on January 21, 2020 and went over the procedure in some detail.    She presents today for laparoscopic Roux-en-Y gastric bypass.    Past Medical History:  Diagnosis Date  . Anxiety   . Coronary artery disease    a. 06/2014 NSTEMI s/p CABG x 4 (LIMA to LAD, VG to Diag, VG to OM, VG to PDA).  . Diabetic neuropathy ([place])   . GERD (gastroesophageal reflux disease)   . HTN (hypertension)   . Hyperlipidemia LDL goal <70   . IDDM (insulin dependent diabetes mellitus)   . Ischemic cardiomyopathy    a. 06/2014 Echo: EF 40-45%, HK of entire inferolateral and inferior myocardium c/w infarct of RCA/LCx, GR2DD, mild MR  . OA (osteoarthritis) of knee   . Obesity   . Osteoarthritis   . Osteomyelitis ([place])   . Peripheral neuropathy   . Peripheral vascular disease ([place])    a. 09/2016 Periph Angio: CTO R popliteal, CTO L prox/mid SFA->Med Rx; b. 05/2017 PTA: LSFA (Viabahn covered stent x 2), DEB to L Post Tibial; c. 06/2017 ABI: R 0.44, L 1.05.  . Tobacco abuse     Past Surgical History:  Procedure Laterality Date  . ABDOMINAL AORTOGRAM W/LOWER EXTREMITY N/A 10/27/2016   Procedure: Abdominal Aortogram w/Lower Extremity;  Surgeon: Nelva Bush, MD;  Location: [facility];  Service:  Cardiovascular;  Laterality: N/A;  . AMPUTATION TOE Left 10/21/2015   Procedure: AMPUTATION TOE;  Surgeon: Sharlotte Alamo, DPM;  Location: [facility];  Service: Podiatry;  Laterality: Left;  . AMPUTATION TOE Left 05/15/2017   Procedure: AMPUTATION LEFT GREAT TOE;  Surgeon: Sharlotte Alamo, DPM;  Location: [facility];  Service: Podiatry;  Laterality: Left;  . AORTIC VALVE REPLACEMENT (AVR)/CORONARY ARTERY BYPASS GRAFTING (CABG)    . BREAST BIOPSY    . CARDIAC CATHETERIZATION  06/2014   95% stenosis mLAD, occlusion ostial OM1, 70% stenosis LCx, 95% stenosis mRCA, EF 45%.  . CORONARY ARTERY BYPASS GRAFT N/A 06/08/2014   Procedure: CORONARY ARTERY BYPASS GRAFTING (CABG);  Surgeon: Grace Isaac, MD;  Location: [facility];  Service: Open Heart Surgery;  Laterality: N/A;  Times 4 using left internal mammary artery to LAD artery and endoscopically harvested bilateral saphenous vein to Obtuse Marginal, Diagonal and Posterior Descending coronary arteries.  . CT ABD W & PELVIS WO CM  06/2014   nl liver, gallbladder, spleen, mild diverticular changes, no bowel wall inflammation, appendix nl, no hernia, no other sig abnormalities  . LOWER EXTREMITY ANGIOGRAPHY Left 05/23/2017   Procedure: LOWER EXTREMITY ANGIOGRAPHY;  Surgeon: Algernon Huxley, MD;  Location: [facility];  Service: Cardiovascular;  Laterality: Left;  . LOWER EXTREMITY ANGIOGRAPHY Left 05/28/2017   Procedure: LOWER EXTREMITY ANGIOGRAPHY;  Surgeon: [name]  S, MD;  Location: [facility];  Service: Cardiovascular;  Laterality: Left;  . LOWER EXTREMITY ANGIOGRAPHY Left 12/16/2018   Procedure: LOWER EXTREMITY ANGIOGRAPHY;  Surgeon: Algernon Huxley, MD;  Location: [facility];  Service: Cardiovascular;  Laterality: Left;  . LOWER EXTREMITY ANGIOGRAPHY Right 12/23/2018   Procedure: LOWER EXTREMITY ANGIOGRAPHY;  Surgeon: Algernon Huxley, MD;  Location: [facility];  Service: Cardiovascular;  Laterality: Right;  . TEE WITHOUT CARDIOVERSION  N/A 06/08/2014   Procedure: TRANSESOPHAGEAL ECHOCARDIOGRAM (TEE);  Surgeon: Grace Isaac, MD;  Location: [facility];  Service: Open Heart Surgery;  Laterality: N/A;  . TOE AMPUTATION Right 12/2011   rt middle toe  . US ECHOCARDIOGRAPHY  06/2014   EF 50-55%, HK of inf/post/inferolat walls, Ao sclerosis    No current facility-administered medications for this encounter.   Current Outpatient Medications  Medication Sig Dispense Refill  . amLODipine (NORVASC) 5 MG tablet Take 5 mg by mouth daily.     Marland Kitchen aspirin 81 MG EC tablet Take 1 tablet (81 mg total) by mouth daily. 30 tablet 0  . atorvastatin (LIPITOR) 80 MG tablet TAKE 1 TABLET EVERY DAY  AT  6  PM (Patient taking differently: Take 80 mg by mouth daily at 6 PM. ) 90 tablet 2  . clobetasol (TEMOVATE) 0.05 % external solution Patient to mix entire bottle with 1 jar of CeraVe and apply twice a day x 1 month then as needed. Avoid face, groin, underarms. (Patient taking differently: Apply 1 application topically 2 (two) times daily. Patient to mix entire bottle with 1 jar of CeraVe and apply twice a day x 1 month then as needed. Avoid face, groin, underarms.) 50 mL 0  . clopidogrel (PLAVIX) 75 MG tablet Take 1 tablet (75 mg total) by mouth daily. 90 tablet 3  . DULoxetine (CYMBALTA) 60 MG capsule Take 60 mg by mouth daily.     . Eflornithine HCl 13.9 % cream Apply to upper lip, chin and neck twice a day. (Patient taking differently: Apply 1 application topically in the morning and at bedtime. Apply to upper lip, chin and neck twice a day.) 45 g 2  . Emollient (CERAVE EX) Apply 1 application topically in the morning and at bedtime. Mixed with clobetasol    . ferrous sulfate 325 (65 FE) MG tablet Take 325 mg by mouth daily.    . furosemide (LASIX) 20 MG tablet TAKE 1 TABLET (20 MG TOTAL) BY MOUTH DAILY AS NEEDED. (Patient taking differently: Take 20 mg by mouth daily as needed (fluid retention.). ) 90 tablet 2  . LANTUS SOLOSTAR 100 UNIT/ML Solostar Pen  Inject 70 Units into the skin in the morning and at bedtime.     . lidocaine (LIDODERM) 5 % Place 1 patch onto the skin daily as needed (PAIN.).     Marland Kitchen lisinopril (PRINIVIL,ZESTRIL) 5 MG tablet Take 1 tablet (5 mg total) by mouth daily. 30 tablet 0  . metoprolol tartrate (LOPRESSOR) 25 MG tablet TAKE 1/2 TABLET TWICE DAILY  (  BETA  BLOCKER  ) (Patient taking differently: Take 12.5 mg by mouth in the morning and at bedtime. ) 90 tablet 1  . NOVOLOG FLEXPEN 100 UNIT/ML FlexPen Inject 6 Units into the skin 3 (three) times daily before meals. *HOLD FOR LOW BLOOD GLUCOSE READING*    . oxybutynin (DITROPAN) 5 MG tablet Take 5 mg by mouth 2 (two) times daily.     . potassium chloride SA (KLOR-CON) 20  MEQ tablet TAKE 1 TABLET (20 MEQ TOTAL) BY MOUTH DAILY AS NEEDED. (Patient taking differently: Take 20 mEq by mouth daily as needed (with lasix (fluid retention)). ) 90 tablet 3  . pregabalin (LYRICA) 100 MG capsule Take 100 mg by mouth 3 (three) times daily.     Marland Kitchen tiZANidine (ZANAFLEX) 4 MG tablet Take 4 mg by mouth 3 (three) times daily.    Marland Kitchen VICTOZA 18 MG/3ML SOPN Inject 1.2 mg into the skin daily.      Patient has no known allergies. Family History  Problem Relation Age of Onset  . CAD Mother   . Hypertension Mother   . Diabetes Mother   . CAD Father   . Diabetes Father   . Sickle cell trait Other   . Asthma Other    Social History:   reports that she has been smoking cigarettes. She has a 30.00 pack-year smoking history. She has never used smokeless tobacco. She reports previous drug use. She reports that she does not drink alcohol.   REVIEW OF SYSTEMS : Negative except for see extensive problem list.  Patient walks with a protective boo  Physical Exam:   There were no vitals taken for this visit. There is no height or weight on file to calculate BMI.  Gen:  WDWN African-American female NAD  Neurological: Alert and oriented to person, place, and time. Motor and sensory function is grossly  intact  Head: Normocephalic and atraumatic.  Eyes: Conjunctivae are normal. Pupils are equal, round, and reactive to light. No scleral icterus.  Neck: Normal range of motion. Neck supple. No tracheal deviation or thyromegaly present.  Cardiovascular:  SR without murmurs or gallops.  No carotid bruits Breast:  not examined Respiratory: Effort normal.  No respiratory distress. No chest wall tenderness. Breath sounds normal.  No wheezes, rales or rhonchi.  Abdomen:  no incisions--centripedal obesiy c/w metabolic syndrome GU:  not examined Musculoskeletal: Normal range of motion. Extremities are nontender. No cyanosis, edema or clubbing noted--absent toes of 2 toes and partial great toe on the left foot Lymphadenopathy: No cervical, preauricular, postauricular or axillary adenopathy is present Skin: Skin is warm and dry. No rash noted. No diaphoresis. No erythema. No pallor. Pscyh: Normal mood and affect. Behavior is normal. Judgment and thought content normal.   LABORATORY RESULTS: No results found for this or any previous visit (from the past 48 hour(s)).   RADIOLOGY RESULTS: No results found.  Problem List: Patient Active Problem List   Diagnosis Date Noted  . Severe recurrent major depression without psychotic features ([place]) 03/12/2018  . Cocaine abuse ([place]) 03/12/2018  . Suicidal ideation 03/12/2018  . Atherosclerosis of native arteries of the extremities with ulceration ([employer]) 06/19/2017  . Gangrene of toe of left foot ([place]) 05/14/2017  . CAD (coronary artery disease), native coronary artery 05/05/2017  . Claudication in peripheral vascular disease ([place]) 10/18/2016  . Hyperlipidemia LDL goal <70 10/18/2016  . Onychomycosis 10/10/2016  . Encounters for administrative purpose 10/10/2016  . Amputated toe, left ([place]) 10/10/2016  . Amputated toe, right ([place]) 10/10/2016  . Osteomyelitis of left foot ([place]) 10/21/2015  . Hyperglycemia 10/02/2015  . Gastroesophageal reflux disease  11/16/2014  . Generalized ischemic myocardial dysfunction 07/07/2014  . Peripheral neuropathy   . Tobacco abuse   . Obesity   . 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 Chronic coronary artery disease 06/08/2014  . DM2 (diabetes mellitus, type 2) ([place])   . NSTEMI (non-ST elevated myocardial infarction) ([place]) 06/03/2014  .  Hypertension 09/11/2012  . Diabetes mellitus type 2, insulin dependent ([place]) 09/11/2012    Assessment & Plan: Significant metabolic syndrome in a patient with a BMI of 44.  I think should be well served with a laparoscopic gastric bypass.      Matt B. [place] Done, MD, [facility] [phone] beeper [phone]  01/21/2020 4:33 PM

## 2020-01-26 ENCOUNTER — Encounter: Payer: Medicare HMO | Admitting: Dietician

## 2020-01-26 ENCOUNTER — Other Ambulatory Visit: Payer: Self-pay

## 2020-01-26 ENCOUNTER — Encounter: Payer: Self-pay | Admitting: Dietician

## 2020-01-26 DIAGNOSIS — E11621 Type 2 diabetes mellitus with foot ulcer: Secondary | ICD-10-CM | POA: Diagnosis not present

## 2020-01-26 DIAGNOSIS — Z6841 Body Mass Index (BMI) 40.0 and over, adult: Secondary | ICD-10-CM

## 2020-01-26 NOTE — Progress Notes (Addendum)
Nutrition Update  Proposed Surgery: RY Gastric Bypass   Height: 5'8.5" Weight: 295.2lbs BMI: 44.23   Patient's Goal Weight: 170lbs  Progress:   Patient reports recent HbA1C 6.9%.   She has goal of engaging in serving ministry and reducing medications by losing weight after surgery. She has a cousin who had bariatric surgery several years ago and is providing support.  Continues to smoke 3/4 pk cigarettes daily, but trying to quit. Unable to take Chantix due to reaction of disturbing dreams.  She is unable to do much cooking due to foot ulcer.   Medications and Supplements: amLODipine, aspirin low dose, atorvastatin, clopidogrel, DULoxetine, ferrous sulfate, furosemide prn, Lantus insulin, lidocaine patch prn, lisinopril, metoprolol, Novolog insulin, oxybutynin, potassium chloride prn, pregabalin, tiZANidine, Victoza    Physical activity: none due to ulcer on left foot  Dietary Recall:  Daily pattern: 3 meals and 1 snacks. Dining out: 0-1 meals per week. Breakfast/ lunch: egg or hot dog Supper: frozen meal  Snack(s): fruit, nuts, crackers, chips Beverages: water,   Intervention:  Reviewed pre-op diet guidelines, which patient will begin tomorrow, as surgery is scheduled in 2 weeks.  Instructed on appropriate foods and drinks both pre- and post-op. Discussed post-op bariatric diet stages.   Reviewed importance of vitamin/ mineral supplements post-op and acceptable options.  Reviewed acceptable protein drinks.   Discussed goals for protein and fluid intake.   Reviewed importance of lifelong diet and lifestyle changes in order to achieve health goals.  Nutrition Diagnosis: [place]-3.3 Overweight/obesity As related to history of excess calories and inadequate physical activity/ exercise limitations.  As evidenced by patient with current BMI of 44.23, working on diet and lifestyle changes to promote health and weight loss prior to bariatric surgery.  Goals:  Patient to begin  pre-op liver reduction diet.   Patient to prepare for post-op diet by purchasing protein drinks, caffeine free fluids.   Patient to call with any questions or concerns before or after surgery.  Plan:  Patient commits to returning for 2-week post-op visit, to be scheduled.

## 2020-01-26 NOTE — Patient Instructions (Signed)
   Start pre-op diet with a protein shake to replace one meal daily, and 2 meals that have lean protein + low carb veggies. Include up to 15grams of carb with each meal to prevent low blood sugar.  Check into options for protein drinks and bariatric multivitamins. Don't stockpile supplies in case they don't work for you after surgery.

## 2020-01-27 ENCOUNTER — Encounter: Payer: Medicare HMO | Admitting: Physician Assistant

## 2020-01-27 ENCOUNTER — Telehealth: Payer: Self-pay | Admitting: Dietician

## 2020-01-27 DIAGNOSIS — E11621 Type 2 diabetes mellitus with foot ulcer: Secondary | ICD-10-CM | POA: Diagnosis not present

## 2020-01-27 NOTE — Progress Notes (Addendum)
Melinda [name], Melinda [name] ([id number]) Visit Report for 01/27/2020 Arrival Information Details Patient Name: Melinda [name], Melinda [name] Date of Service: 01/27/2020 11:00 AM Medical Record Number: [id number] Patient Account Number: [id number] Date of Birth/Sex: 1955/07/03 (64 y.o. F) Treating RN: [name] Primary Care [name]: [name] Other Clinician: Referring [name]: [name] Treating [name]: [name], [name]: 6 Visit Information History Since Last Visit Added or deleted any medications: No Patient Arrived: Ambulatory Has Dressing in Place as Prescribed: Yes Arrival Time: 11:08 Has Footwear/Offloading in Place as Prescribed: Yes Accompanied By: self Left: Surgical Shoe with Pressure Relief Transfer Assistance: None Insole Patient Identification Verified: Yes Pain Present Now: No Secondary Verification Process Completed: Yes Patient Requires Transmission-Based No Precautions: Patient Has Alerts: Yes Patient Alerts: 10/28/19 ABI: R 1.05 L .60 Electronic Signature(s) Signed: 01/27/2020 5:46:44 PM By: [name], [name], RN, [name], [name] RN, [name] Entered By: [name], [name], RN, [name], [name] on 01/27/2020 11:09:16 Melinda [name] ([id number]) -------------------------------------------------------------------------------- Encounter Discharge Information Details Patient Name: Melinda [name] Date of Service: 01/27/2020 11:00 AM Medical Record Number: [id number] Patient Account Number: [id number] Date of Birth/Sex: 1955/08/13 (64 y.o. F) Treating RN: [name] Primary Care Shylee Durrett: [name] Other Clinician: Referring Kaleth Koy: [name] Treating Aiman Sonn/Extender: [name], [name]: 6 Encounter Discharge Information Items Post Procedure Vitals Discharge Condition: Stable Temperature (F): 98.1 Ambulatory Status: Ambulatory Pulse (bpm): 81 Discharge Destination: Home Respiratory Rate (breaths/min): 16 Transportation: Private Auto Blood Pressure  (mmHg): 120/63 Accompanied By: self Schedule Follow-up Appointment: Yes Clinical Summary of Care: Electronic Signature(s) Signed: 01/27/2020 3:26:17 PM By: [name] Entered By: [name] on 01/27/2020 11:44:14 Melinda [name] ([id number]) -------------------------------------------------------------------------------- Lower Extremity Assessment Details Patient Name: Melinda [name] Date of Service: 01/27/2020 11:00 AM Medical Record Number: [id number] Patient Account Number: [id number] Date of Birth/Sex: 1956-03-20 (64 y.o. F) Treating RN: [name] Primary Care Nathalee Smarr: [name] Other Clinician: Referring Nilaya Bouie: [name] Treating Lurie Mullane/Extender: [name], [name]: 6 Vascular Assessment Pulses: Dorsalis Pedis Palpable: [Left:Yes] Notes Patient has bounding pulses Electronic Signature(s) Signed: 01/27/2020 5:46:44 PM By: [name], [name], RN, [name], [name] RN, [name] Entered By: [name], [name], RN, [name], [name] on 01/27/2020 11:17:03 Melinda [name] ([id number]) -------------------------------------------------------------------------------- Multi Wound Chart Details Patient Name: Melinda [name] Date of Service: 01/27/2020 11:00 AM Medical Record Number: [id number] Patient Account Number: [id number] Date of Birth/Sex: 1955-06-23 (64 y.o. F) Treating RN: [name] Primary Care Marvina Danner: [name] Other Clinician: Referring Denicia Pagliarulo: [name] Treating Joniece Smotherman/Extender: [name], [name]: 6 Vital Signs Height(in): 69 Pulse(bpm): 19 Weight(lbs): Blood Pressure(mmHg): 120/63 Body Mass Index(BMI): Temperature(F): 98.1 Respiratory Rate(breaths/min): 16 Photos: [5:No Photos] [N/A:N/A] Wound Location: [5:Left, Plantar Toe Great] [N/A:N/A] Wounding Event: [5:Gradually Appeared] [N/A:N/A] Primary Etiology: [5:Diabetic Wound/Ulcer of the Lower N/A Extremity] Date Acquired: [5:12/02/2019] [N/A:N/A] Weeks of Treatment: [5:6]  [N/A:N/A] Wound Status: [5:Open] [N/A:N/A] Measurements L x W x D (cm) [5:0.2x0.2x0.5] [N/A:N/A] Area (cm) : [5:0.031] [N/A:N/A] Volume (cm) : [5:0.016] [N/A:N/A] % Reduction in Area: [5:98.80%] [N/A:N/A] % Reduction in Volume: [5:99.30% Grade 2] [N/A:N/A N/A] Treatment Notes Electronic Signature(s) Signed: 01/27/2020 3:26:17 PM By: [name] Entered By: [name] on 01/27/2020 11:38:56 Melinda [name] ([id number]) -------------------------------------------------------------------------------- [place] Details Patient Name: Melinda [name] Date of Service: 01/27/2020 11:00 AM Medical Record Number: [id number] Patient Account Number: [id number] Date of Birth/Sex: 02/19/1956 (64 y.o. F) Treating RN: [name] Primary Care Brinsley Wence: [name] Other Clinician: Referring Cerria Randhawa: [name] Treating Tychelle Purkey/Extender: [name], [name] Weeks in  Treatment: 6 Active Inactive Necrotic Tissue Nursing Diagnoses: Impaired tissue integrity related to necrotic/devitalized tissue Goals: Necrotic/devitalized tissue will be minimized in the wound bed Date Initiated: 12/16/2019 Target Resolution Date: 12/26/2019 Goal Status: Active Interventions: Assess patient pain level pre-, during and post procedure and prior to discharge Treatment Activities: Excisional debridement : 12/16/2019 Notes: Orientation to the Wound Care Program Nursing Diagnoses: Knowledge deficit related to the wound healing center program Goals: Patient/caregiver will verbalize understanding of the [facility] Program Date Initiated: 12/16/2019 Target Resolution Date: 12/26/2019 Goal Status: Active Interventions: Provide education on orientation to the wound center Notes: Peripheral Neuropathy Nursing Diagnoses: Knowledge deficit related to disease process and management of peripheral neurovascular dysfunction Potential alteration in peripheral tissue perfusion  (select prior to confirmation of diagnosis) Goals: Patient/caregiver will verbalize understanding of disease process and disease management Date Initiated: 12/16/2019 Target Resolution Date: 12/26/2019 Goal Status: Active Interventions: Provide education on Management of Neuropathy and Related Ulcers Treatment Activities: Patient referred for customized footwear/offloading : 12/16/2019 Notes: Pressure Nursing Diagnoses: Melinda [name], Melinda [name] ([id number]) Knowledge deficit related to causes and risk factors for pressure ulcer development Knowledge deficit related to management of pressures ulcers Goals: Patient will remain free from development of additional pressure ulcers Date Initiated: 12/16/2019 Target Resolution Date: 12/26/2019 Goal Status: Active Interventions: Provide education on pressure ulcers Notes: Wound/Skin Impairment Nursing Diagnoses: Impaired tissue integrity Goals: Ulcer/skin breakdown will have a volume reduction of 30% by week 4 Date Initiated: 12/16/2019 Target Resolution Date: 01/16/2020 Goal Status: Active Interventions: Assess patient/caregiver ability to obtain necessary supplies Assess ulceration(s) every visit Treatment Activities: Skin care regimen initiated : 12/16/2019 Notes: Electronic Signature(s) Signed: 01/27/2020 3:26:17 PM By: [name] Entered By: [name] on 01/27/2020 11:38:50 Melinda [name] ([id number]) -------------------------------------------------------------------------------- Pain Assessment Details Patient Name: Melinda [name] Date of Service: 01/27/2020 11:00 AM Medical Record Number: [id number] Patient Account Number: [id number] Date of Birth/Sex: Mar 01, 1956 (64 y.o. F) Treating RN: [name] Primary Care Shanena Pellegrino: [name] Other Clinician: Referring Adonai Helzer: [name] Treating Island Dohmen/Extender: [name], [name]: 6 Active Problems Location of Pain Severity and Description of Pain  Patient Has Paino Yes Site Locations Pain Management and Medication Current Pain Management: Notes Pain is not in her foot. Electronic Signature(s) Signed: 01/27/2020 5:46:44 PM By: [name], [name], RN, [name], [name] RN, [name] Entered By: [name], [name], RN, [name], [name] on 01/27/2020 11:09:38 Melinda [name] ([id number]) -------------------------------------------------------------------------------- Patient/Caregiver Education Details Patient Name: Melinda [name] Date of Service: 01/27/2020 11:00 AM Medical Record Number: [id number] Patient Account Number: [id number] Date of Birth/Gender: 08-19-1955 (64 y.o. F) Treating RN: [name] Primary Care Physician: [name] Other Clinician: Referring Physician: [name] Treating Physician/Extender: Sharalyn Ink in Treatment: 6 Education Assessment Education Provided To: Patient Education Topics Provided Wound/Skin Impairment: Methods: Explain/Verbal Responses: State content correctly Electronic Signature(s) Signed: 01/27/2020 3:26:17 PM By: [name] Entered By: [name] on 01/27/2020 11:41:28 Melinda [name] ([id number]) -------------------------------------------------------------------------------- Wound Assessment Details Patient Name: Melinda [name] Date of Service: 01/27/2020 11:00 AM Medical Record Number: [id number] Patient Account Number: [id number] Date of Birth/Sex: 12/27/1955 (64 y.o. F) Treating RN: [name] Primary Care Berna Gitto: [name] Other Clinician: Referring Gennett Garcia: [name] Treating Zeinab Rodwell/Extender: [name], [name]: 6 Wound Status Wound Number: 5 Primary Etiology: Diabetic Wound/Ulcer of the Lower Extremity Wound Location: Left, Plantar Toe Great Wound Status: Open Wounding Event: Gradually Appeared Date Acquired: 12/02/2019 Weeks Of Treatment: 6 Clustered Wound: No Wound Measurements Length: (cm) 0.2 Width: (  cm) 0.2 Depth: (cm) 0.5 Area: (cm)  0.031 Volume: (cm) 0.016 % Reduction in Area: 98.8% % Reduction in Volume: 99.3% Wound Description Classification: Grade 2 Treatment Notes Wound #5 (Left, Plantar Toe Great) Notes prisma, gauze and conform, peg assist shoe Electronic Signature(s) Signed: 01/27/2020 5:46:44 PM By: [name], [name], RN, [name], [name] RN, [name] Entered By: [name], [name], RN, [name], [name] on 01/27/2020 11:15:21 Melinda [name] ([id number]) -------------------------------------------------------------------------------- Vitals Details Patient Name: Melinda [name] Date of Service: 01/27/2020 11:00 AM Medical Record Number: [id number] Patient Account Number: [id number] Date of Birth/Sex: 1955/08/25 (64 y.o. F) Treating RN: [name] Primary Care Adreyan Carbajal: [name] Other Clinician: Referring Berkeley Vanaken: [name] Treating Queena Monrreal/Extender: [name], [name]: 6 Vital Signs Time Taken: 11:13 Temperature (F): 98.1 Height (in): 69 Pulse (bpm): 81 Respiratory Rate (breaths/min): 16 Blood Pressure (mmHg): 120/63 Reference Range: 80 - 120 mg / dl Electronic Signature(s) Signed: 01/27/2020 5:46:44 PM By: [name], [name], RN, [name], [name] RN, [name] Entered By: [name], [name], RN, [name], [name] on 01/27/2020 11:13:43
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
## 2020-01-27 NOTE — Telephone Encounter (Signed)
Spoke with patient by phone to ensure she is ready and understands how to implement pre-op diet. Reviewed options for protein foods and vegetables. Advised again allowing for up to 15g carb with meals to prevent hypoglycemia. Discussed proper treatment for hypoglycemia.

## 2020-01-28 NOTE — Patient Instructions (Addendum)
DUE TO COVID-19 ONLY ONE VISITOR IS ALLOWED TO COME WITH YOU AND STAY IN THE WAITING ROOM ONLY DURING PRE OP AND PROCEDURE DAY OF SURGERY. THE 1 VISITOR  MAY VISIT WITH YOU AFTER SURGERY IN YOUR PRIVATE ROOM DURING VISITING HOURS ONLY!  YOU NEED TO HAVE A COVID 19 TEST ON___10/7____ @__10 :00_____, THIS TEST MUST BE DONE BEFORE SURGERY,  COVID TESTING SITE [place] [address], IT IS ON THE RIGHT GOING OUT [address] APPROXIMATELY  2 MINUTES PAST [employer] ON THE RIGHT. ONCE YOUR COVID TEST IS COMPLETED,  PLEASE BEGIN THE QUARANTINE INSTRUCTIONS AS OUTLINED IN YOUR HANDOUT.                Melinda [name]   Your procedure is scheduled on: 02/09/20   Report to [facility] Main  Entrance   Report to admitting at  8:00 AM     Call this number if you have problems the morning of surgery [place], NO CHEWING GUM [place].   NO SOLID FOOD AFTER 6:00 PM THE NIGHT BEFORE YOUR SURGERY.   YOU MAY DRINK CLEAR FLUIDS until 7:00 am  Have the G2 drink finished by 7:00 am   PAIN IS EXPECTED AFTER SURGERY AND WILL NOT BE COMPLETELY ELIMINATED.   AMBULATION AND TYLENOL WILL HELP REDUCE INCISIONAL AND GAS PAIN. MOVEMENT IS KEY!  YOU ARE EXPECTED TO BE OUT OF BED WITHIN 4 HOURS OF ADMISSION TO YOUR PATIENT ROOM.  SITTING IN THE RECLINER THROUGHOUT THE DAY IS IMPORTANT FOR DRINKING FLUIDS AND MOVING GAS THROUGHOUT THE GI TRACT.  COMPRESSION STOCKINGS SHOULD BE WORN [place] UNLESS YOU ARE WALKING.   INCENTIVE SPIROMETER SHOULD BE USED EVERY HOUR WHILE AWAKE TO DECREASE POST-OPERATIVE COMPLICATIONS SUCH AS PNEUMONIA.  WHEN DISCHARGED HOME, IT IS IMPORTANT TO CONTINUE TO WALK EVERY HOUR AND USE THE INCENTIVE SPIROMETER EVERY HOUR.        Take these medicines the morning of surgery with A SIP OF WATER:  Lyrica, Cymbalta, Metoprolol, Amlodipine, Ditropan           How to Manage Your Diabetes Before and After Surgery  Why is it important to control my blood sugar before and after surgery? . Improving blood sugar levels before and after surgery helps healing and can limit problems. . A way of improving blood sugar control is eating a healthy diet by: o  Eating less sugar and carbohydrates o  Increasing activity/exercise o  Talking with your doctor about reaching your blood sugar goals . High blood sugars (greater than 180 mg/dL) can raise your risk of infections and slow your recovery, so you will need to focus on controlling your diabetes during the weeks before surgery. . Make sure that the doctor who takes care of your diabetes knows about your planned surgery including the date and location.  How do I manage my blood sugar before surgery? . Check your blood sugar at least 4 times a day, starting 2 days before surgery, to make sure that the level is not too high or low. o Check your blood sugar the morning of your surgery when you wake up and every 2 hours until you get to the Short Stay unit. . If your blood sugar is less than 70 mg/dL, you will need to treat for low blood sugar: o Do not take insulin. o Treat a low blood sugar (  less than 70 mg/dL) with  cup of clear juice (cranberry or apple), 4 glucose tablets, OR glucose gel. o Recheck blood sugar in 15 minutes after treatment (to make sure it is greater than 70 mg/dL). If your blood sugar is not greater than 70 mg/dL on recheck, call [phone] for further instructions. . Report your blood sugar to the short stay nurse when you get to Short Stay.  . If you are admitted to the hospital after surgery: o Your blood sugar will be checked by the staff and you will probably be given insulin after surgery (instead of oral diabetes medicines) to make sure you have good blood sugar levels. o The goal for blood sugar control after surgery is 80-180 mg/dL.   WHAT DO I DO ABOUT MY DIABETES  MEDICATION?  Marland Kitchen Do not take oral diabetes medicines (pills) the morning of surgery.  . THE NIGHT BEFORE SURGERY, take 35    units of   Lantus   insulin.       . THE MORNING OF SURGERY, take  35   units of  Lantus     insulin.  . The day of surgery, do not take other diabetes injectables, including Byetta (exenatide), Bydureon (exenatide ER), Victoza (liraglutide), or Trulicity (dulaglutide).                     You may not have any metal on your body including hair pins and              piercings  Do not wear jewelry, make-up, lotions, powders or perfumes, deodorant             Do not wear nail polish on your fingernails.  Do not shave  48 hours prior to surgery.                 Do not bring valuables to the hospital. [place].  Contacts, dentures or bridgework may not be worn into surgery.                  Please read over the following fact sheets you were given: _____________________________________________________________________             [facility] - Preparing for Surgery Before surgery, you can play an important role.   Because skin is not sterile, your skin needs to be as free of germs as possible.   You can reduce the number of germs on your skin by washing with CHG (chlorahexidine gluconate) soap before surgery.   CHG is an antiseptic cleaner which kills germs and bonds with the skin to continue killing germs even after washing. Please DO NOT use if you have an allergy to CHG or antibacterial soaps.   If your skin becomes reddened/irritated stop using the CHG and inform your nurse when you arrive at Short Stay. Do not shave (including legs and underarms) for at least 48 hours prior to the first CHG shower.   Please follow these instructions carefully:  1.  Shower with CHG Soap the night before surgery and the  morning of Surgery.  2.  If you choose to wash your hair, wash your hair first as usual with your  normal   shampoo.  3.  After you shampoo, rinse your hair and body thoroughly to remove the  shampoo.  4.  Use CHG as you would any other liquid soap.  You can apply chg directly  to the skin and wash                       Gently with a scrungie or clean washcloth.  5.  Apply the CHG Soap to your body ONLY FROM THE NECK DOWN.   Do not use on face/ open                           Wound or open sores. Avoid contact with eyes, ears mouth and genitals (private parts).                       Wash face,  Genitals (private parts) with your normal soap.             6.  Wash thoroughly, paying special attention to the area where your surgery  will be performed.  7.  Thoroughly rinse your body with warm water from the neck down.  8.  DO NOT shower/wash with your normal soap after using and rinsing off  the CHG Soap.             9.  Pat yourself dry with a clean towel.            10.  Wear clean pajamas.            11.  Place clean sheets on your bed the night of your first shower and do not  sleep with pets. Day of Surgery : Do not apply any lotions/deodorants the morning of surgery.  Please wear clean clothes to the hospital/surgery center.  FAILURE TO FOLLOW THESE INSTRUCTIONS MAY RESULT IN THE CANCELLATION OF YOUR SURGERY PATIENT SIGNATURE_________________________________  NURSE SIGNATURE__________________________________  ________________________________________________________________________   Adam Phenix  An incentive spirometer is a tool that can help keep your lungs clear and active. This tool measures how well you are filling your lungs with each breath. Taking long deep breaths may help reverse or decrease the chance of developing breathing (pulmonary) problems (especially infection) following:  A long period of time when you are unable to move or be active. BEFORE THE PROCEDURE   If the spirometer includes an indicator to show your best effort, your  nurse or respiratory therapist will set it to a desired goal.  If possible, sit up straight or lean slightly forward. Try not to slouch.  Hold the incentive spirometer in an upright position. INSTRUCTIONS FOR USE  1. Sit on the edge of your bed if possible, or sit up as far as you can in bed or on a chair. 2. Hold the incentive spirometer in an upright position. 3. Breathe out normally. 4. Place the mouthpiece in your mouth and seal your lips tightly around it. 5. Breathe in slowly and as deeply as possible, raising the piston or the ball toward the top of the column. 6. Hold your breath for 3-5 seconds or for as long as possible. Allow the piston or ball to fall to the bottom of the column. 7. Remove the mouthpiece from your mouth and breathe out normally. 8. Rest for a few seconds and repeat Steps 1 through 7 at least 10 times every 1-2 hours when you are awake. Take your time and take a few normal breaths between deep breaths. 9. The spirometer may include an indicator to show your best effort.  Use the indicator as a goal to work toward during each repetition. 10. After each set of 10 deep breaths, practice coughing to be sure your lungs are clear. If you have an incision (the cut made at the time of surgery), support your incision when coughing by placing a pillow or rolled up towels firmly against it. Once you are able to get out of bed, walk around indoors and cough well. You may stop using the incentive spirometer when instructed by your caregiver.  RISKS AND COMPLICATIONS  Take your time so you do not get dizzy or light-headed.  If you are in pain, you may need to take or ask for pain medication before doing incentive spirometry. It is harder to take a deep breath if you are having pain. AFTER USE  Rest and breathe slowly and easily.  It can be helpful to keep track of a log of your progress. Your caregiver can provide you with a simple table to help with this. If you are using the  spirometer at home, follow these instructions: [place] IF:   You are having difficultly using the spirometer.  You have trouble using the spirometer as often as instructed.  Your pain medication is not giving enough relief while using the spirometer.  You develop fever of 100.5 F (38.1 C) or higher. SEEK IMMEDIATE MEDICAL CARE IF:   You cough up bloody sputum that had not been present before.  You develop fever of 102 F (38.9 C) or greater.  You develop worsening pain at or near the incision site. MAKE SURE YOU:   Understand these instructions.  Will watch your condition.  Will get help right away if you are not doing well or get worse. Document Released: 08/28/2006 Document Revised: 07/10/2011 Document Reviewed: 10/29/2006 [facility] Patient Information 2014 [place], [place].   ________________________________________________________________________

## 2020-01-29 ENCOUNTER — Ambulatory Visit: Payer: Medicare HMO | Admitting: Podiatry

## 2020-01-30 ENCOUNTER — Telehealth: Payer: Self-pay | Admitting: Dietician

## 2020-01-30 ENCOUNTER — Telehealth: Payer: Self-pay | Admitting: Cardiovascular Disease

## 2020-01-30 NOTE — Telephone Encounter (Signed)
Primary Cardiologist:Timothy Rockey Situ, MD  Chart reviewed as part of pre-operative protocol coverage. Because of Melinda [name]'s past medical history and time since last visit, he/she will require a follow-up visit in order to better assess preoperative cardiovascular risk.  Pre-op covering staff: - Please schedule appointment and call patient to inform them. - Please contact requesting surgeon's office via preferred method (i.e, phone, fax) to inform them of need for appointment prior to surgery.  If applicable, this message will also be routed to pharmacy pool and/or primary cardiologist for input on holding anticoagulant/antiplatelet agent as requested below so that this information is available at time of patient's appointment.   [name], NP  01/30/2020, 2:32 PM

## 2020-01-30 NOTE — Telephone Encounter (Signed)
Pt has appt with cardiologist 02/03/20 will add pre op clearance is needed. I will forward notes to [facility] for upcoming appt. Will remove from the pre op call back pool.

## 2020-01-30 NOTE — Telephone Encounter (Signed)
Called patient to discuss post-op vitamin supplementation per prior request. She would like to be able to take non-bariatric formula due to lower cost. Advised her to take Flintstones Complete 4 per day to meet supplementation goals as recommended by ASMBS 2019 guidelines.

## 2020-01-30 NOTE — Telephone Encounter (Signed)
   [facility] Pre-operative Risk Assessment    HEARTCARE STAFF: - Please ensure there is not already an duplicate clearance open for this procedure. - Under Visit Info/Reason for Call, type in Other and utilize the format Clearance MM/DD/YY or Clearance TBD. Do not use dashes or single digits. - If request is for dental extraction, please clarify the # of teeth to be extracted.  Request for surgical clearance:  1. What type of surgery is being performed? Gastric Bypass  2. When is this surgery scheduled? 02/09/20  3. What type of clearance is required (medical clearance vs. Pharmacy clearance to hold med vs. Both)? both  4. Are there any medications that need to be held prior to surgery and how long? Not listed, please advise if needed   5. Practice name and name of physician performing surgery? [facility] - Dr Johnathan Hausen   6. What is the office phone number? [phone]   7.   What is the office fax number? [phone]  8.   Anesthesia type (None, local, MAC, general) ? Not listed    Melinda [name] 01/30/2020, 2:17 PM  _________________________________________________________________   (provider comments below)

## 2020-02-02 ENCOUNTER — Encounter (HOSPITAL_COMMUNITY)
Admission: RE | Admit: 2020-02-02 | Discharge: 2020-02-02 | Disposition: A | Discharge status: Home / Self Care | Payer: Medicare HMO | Source: Ambulatory Visit | Attending: Surgery | Admitting: Surgery

## 2020-02-02 ENCOUNTER — Other Ambulatory Visit: Payer: Self-pay

## 2020-02-02 ENCOUNTER — Encounter (HOSPITAL_COMMUNITY): Payer: Self-pay

## 2020-02-02 DIAGNOSIS — Z01812 Encounter for preprocedural laboratory examination: Secondary | ICD-10-CM | POA: Insufficient documentation

## 2020-02-02 HISTORY — DX: Depression, unspecified: F32.A

## 2020-02-02 LAB — CBC WITH DIFFERENTIAL/PLATELET
Abs Immature Granulocytes: 0.06 10*3/uL (ref 0.00–0.07)
Basophils Absolute: 0.1 10*3/uL (ref 0.0–0.1)
Basophils Relative: 0 %
Eosinophils Absolute: 0.5 10*3/uL (ref 0.0–0.5)
Eosinophils Relative: 4 %
HCT: 35.5 % — ABNORMAL LOW (ref 36.0–46.0)
Hemoglobin: 10.7 g/dL — ABNORMAL LOW (ref 12.0–15.0)
Immature Granulocytes: 1 %
Lymphocytes Relative: 20 %
Lymphs Abs: 2.3 10*3/uL (ref 0.7–4.0)
MCH: 21.4 pg — ABNORMAL LOW (ref 26.0–34.0)
MCHC: 30.1 g/dL (ref 30.0–36.0)
MCV: 70.9 fL — ABNORMAL LOW (ref 80.0–100.0)
Monocytes Absolute: 0.9 10*3/uL (ref 0.1–1.0)
Monocytes Relative: 8 %
Neutro Abs: 7.7 10*3/uL (ref 1.7–7.7)
Neutrophils Relative %: 67 %
Platelets: 380 10*3/uL (ref 150–400)
RBC: 5.01 MIL/uL (ref 3.87–5.11)
RDW: 24.9 % — ABNORMAL HIGH (ref 11.5–15.5)
WBC: 11.6 10*3/uL — ABNORMAL HIGH (ref 4.0–10.5)
nRBC: 0 % (ref 0.0–0.2)

## 2020-02-02 LAB — COMPREHENSIVE METABOLIC PANEL
ALT: 12 U/L (ref 0–44)
AST: 11 U/L — ABNORMAL LOW (ref 15–41)
Albumin: 3.6 g/dL (ref 3.5–5.0)
Alkaline Phosphatase: 104 U/L (ref 38–126)
Anion gap: 9 (ref 5–15)
BUN: 21 mg/dL (ref 8–23)
CO2: 26 mmol/L (ref 22–32)
Calcium: 9 mg/dL (ref 8.9–10.3)
Chloride: 107 mmol/L (ref 98–111)
Creatinine, Ser: 1.27 mg/dL — ABNORMAL HIGH (ref 0.44–1.00)
GFR calc Af Amer: 51 mL/min — ABNORMAL LOW (ref >60)
GFR calc non Af Amer: 44 mL/min — ABNORMAL LOW (ref >60)
Glucose, Bld: 77 mg/dL (ref 70–99)
Potassium: 4.3 mmol/L (ref 3.5–5.1)
Sodium: 142 mmol/L (ref 135–145)
Total Bilirubin: 0.5 mg/dL (ref 0.3–1.2)
Total Protein: 7.2 g/dL (ref 6.5–8.1)

## 2020-02-02 LAB — HEMOGLOBIN A1C
Hgb A1c MFr Bld: 6.7 % — ABNORMAL HIGH (ref 4.8–5.6)
Mean Plasma Glucose: 145.59 mg/dL

## 2020-02-02 LAB — GLUCOSE, CAPILLARY
Glucose-Capillary: 60 mg/dL — ABNORMAL LOW (ref 70–99)
Glucose-Capillary: 77 mg/dL (ref 70–99)

## 2020-02-02 NOTE — Progress Notes (Signed)
COVID Vaccine Completed:Yes Date COVID Vaccine completed:07/09/19 COVID vaccine manufacturer:   Moderna     PCP - Dr. Nigel Bridgeman Cardiologist - Dr. Johnny Bridge  Chest x-ray - 07/01/18 EKG - 07/04/19 Stress Test - no ECHO - 2016 Cardiac Cath - CABG 2016 Pacemaker/ICD device last checked:NA  Sleep Study - no CPAP -   Fasting Blood Sugar - 90-115 Checks Blood Sugar _TID____ times a day  Blood Thinner Instructions:ASA and Plavix/ Dr. Rockey Situ Aspirin Instructions: Pt has an appointment with Dr. Rockey Situ on 02/03/20 and will discuss when to stop blood thinners. Last Dose:  Anesthesia review:   Patient denies shortness of breath, fever, cough and chest pain at PAT appointment Yes   Patient verbalized understanding of instructions that were given to them at the PAT appointment. Patient was also instructed that they will need to review over the PAT instructions again at home before surgery. Yes  Pt does get SOB climbing 8 steps to get into her house.  She sometimes feels SOB doing housework but not with ADLs. She has a diabetic ulcer that she sees the wound care clinic on her Lt foot. She said Dr. Hassell Done was aware.  Her blood sugar was low at PAT (60). I gave her a sandwich and a ginger ale. It was 77 at the end of the appointment.

## 2020-02-02 NOTE — Progress Notes (Signed)
Office Visit    Patient Name: Melinda [name] Date of Encounter: 02/03/2020  Primary Care Provider:  Donnie Coffin, MD Primary Cardiologist:  Melinda Rogue, MD  Chief Complaint    Chief Complaint  Patient presents with  . office visit    Preop clearance for bariatric surgery; Meds verbally reviewed with patient.    64 year old female with history of CAD s/p CABG x4 (2016), HFrEF/ ICM, hypertension, hyperlipidemia, IDDM, tobacco use, PAD s/p repeat interventions and followed by [facility], CKD followed by [facility], obesity, anxiety, and who presents today for preoperative clearance prior to gastric bypass surgery.  Past Medical History    Past Medical History:  Diagnosis Date  . Anxiety   . Coronary artery disease    a. 06/2014 NSTEMI s/p CABG x 4 (LIMA to LAD, VG to Diag, VG to OM, VG to PDA).  . Depression   . Diabetic neuropathy ([place])   . GERD (gastroesophageal reflux disease)   . HTN (hypertension)   . Hyperlipidemia LDL goal <70   . IDDM (insulin dependent diabetes mellitus)   . Ischemic cardiomyopathy    a. 06/2014 Echo: EF 40-45%, HK of entire inferolateral and inferior myocardium c/w infarct of RCA/LCx, GR2DD, mild MR  . Myocardial infarction ([place]) 2016  . OA (osteoarthritis) of knee   . Obesity   . Osteoarthritis   . Osteomyelitis ([place])   . Peripheral neuropathy   . Peripheral vascular disease ([place])    a. 09/2016 Periph Angio: CTO R popliteal, CTO L prox/mid SFA->Med Rx; b. 05/2017 PTA: LSFA (Viabahn covered stent x 2), DEB to L Post Tibial; c. 06/2017 ABI: R 0.44, L 1.05.  . Tobacco abuse    Past Surgical History:  Procedure Laterality Date  . ABDOMINAL AORTOGRAM W/LOWER EXTREMITY N/A 10/27/2016   Procedure: Abdominal Aortogram w/Lower Extremity;  Surgeon: Nelva Bush, MD;  Location: [place] [facility];  Service: Cardiovascular;  Laterality: N/A;  . AMPUTATION TOE Left 10/21/2015   Procedure: AMPUTATION TOE;  Surgeon: Sharlotte Alamo, DPM;  Location: [facility];  Service:  Podiatry;  Laterality: Left;  . AMPUTATION TOE Left 05/15/2017   Procedure: AMPUTATION LEFT GREAT TOE;  Surgeon: Sharlotte Alamo, DPM;  Location: [facility];  Service: Podiatry;  Laterality: Left;  . AORTIC VALVE REPLACEMENT (AVR)/CORONARY ARTERY BYPASS GRAFTING (CABG)    . BREAST BIOPSY    . CARDIAC CATHETERIZATION  06/2014   95% stenosis mLAD, occlusion ostial OM1, 70% stenosis LCx, 95% stenosis mRCA, EF 45%.  . CORONARY ARTERY BYPASS GRAFT N/A 06/08/2014   Procedure: CORONARY ARTERY BYPASS GRAFTING (CABG);  Surgeon: Grace Isaac, MD;  Location: [place];  Service: Open Heart Surgery;  Laterality: N/A;  Times 4 using left internal mammary artery to LAD artery and endoscopically harvested bilateral saphenous vein to Obtuse Marginal, Diagonal and Posterior Descending coronary arteries.  . CT ABD W & PELVIS WO CM  06/2014   nl liver, gallbladder, spleen, mild diverticular changes, no bowel wall inflammation, appendix nl, no hernia, no other sig abnormalities  . LOWER EXTREMITY ANGIOGRAPHY Left 05/23/2017   Procedure: LOWER EXTREMITY ANGIOGRAPHY;  Surgeon: Algernon Huxley, MD;  Location: [place] [facility];  Service: Cardiovascular;  Laterality: Left;  . LOWER EXTREMITY ANGIOGRAPHY Left 05/28/2017   Procedure: LOWER EXTREMITY ANGIOGRAPHY;  Surgeon: Algernon Huxley, MD;  Location: [place] [facility];  Service: Cardiovascular;  Laterality: Left;  . LOWER EXTREMITY ANGIOGRAPHY Left 12/16/2018   Procedure: LOWER EXTREMITY ANGIOGRAPHY;  Surgeon: Algernon Huxley, MD;  Location: [place] [facility];  Service: Cardiovascular;  Laterality: Left;  . LOWER EXTREMITY ANGIOGRAPHY Right 12/23/2018   Procedure: LOWER EXTREMITY ANGIOGRAPHY;  Surgeon: Algernon Huxley, MD;  Location: [place] [facility];  Service: Cardiovascular;  Laterality: Right;  . TEE WITHOUT CARDIOVERSION N/A 06/08/2014   Procedure: TRANSESOPHAGEAL ECHOCARDIOGRAM (TEE);  Surgeon: Grace Isaac, MD;  Location: [place];  Service: Open Heart Surgery;  Laterality:  N/A;  . TOE AMPUTATION Right 12/2011   rt middle toe  . US ECHOCARDIOGRAPHY  06/2014   EF 50-55%, HK of inf/post/inferolat walls, Ao sclerosis    Allergies  No Known Allergies  History of Present Illness    Melinda [name] is a 65 y.o. female with PMH as above.  She has a history of CAD s/p CABG, smoking (current), PAD, and diabetes on insulin.  She also has a history of arthritis of chest, arms, and shoulders.  She was admitted to [facility] 2/1 to 06/03/2014 for non-ST elevation myocardial infarction.  She underwent cardiac cath on 2/3 that showed severe three-vessel disease.  She was transferred to [facility] from 2/3-2/13 for four-vessel CABG (LIMA to LAD, SVG to diagonal, SVG to OM, and SVG to PDA).  She has history of PAD with long discussion regarding her PAD with her primary cardiologist at most recent visit.  She underwent left SFA stenting and PTA of the left popliteal artery in 2018.  In 2019, she had a toe amputation due to PAD with ulceration and gangrene of left foot per review of previous progress notes.  In August 2020, she underwent percutaneous transluminal angioplasty of the anterior tibial artery with 3 mm diameter by 10 cm length angioplasty balloon.  Percutaneous transluminal angioplasty of the left distal SFA was performed with 5 mm x 8 cm length tonics, drug-coated angioplasty balloon.  Stent placement was performed of the distal left SFA with a 6 mm x 5 cm length stent for greater than 50% residual stenosis after angioplasty.  She underwent repeat intervention 1 week later.  At follow-up, she denied claudication symptoms in her legs.  She is followed by nephrology.  It has been noted that hemoglobin A1c has improved with insulin increased.  However, at her most recent visit, she was noted to not be eating as well with increase in insulin.  Weight was 314 in the last year and up from 270 in 2019.  She was seen in clinic in 2020 and reported doing reasonably well with  chronic right lower extremity claudication after walking about 25 to 50 yards.  She reported back pain over the last few months, which was her largest limiting factor to ambulation at that point.  She occasionally noted left lower extremity pain, especially if laying on her left side but no significant claudication.  She continued to smoke, though she had considered Chantix previously.  She was on insulin therapy for her diabetes.  Recommendation was for follow-up ABIs.  She was seen by her primary cardiologist, Dr. Rockey Situ, 07/04/2019.  She was smoking 14 cigarettes a day and did not want Chantix.  BP was noted to be stable.  She appears euvolemic on exam with recommendation to consider repeat echo at follow-up visit.  Smoking cessation was stressed given history of PAD and CAD as above.  She denied any anginal symptoms.  Ongoing dietary changes were discussed.  Today, 02/03/2020, she presents to clinic for preop evaluation prior to gastric bypass surgery, scheduled 02/09/2020.  She requires both medical clearance  and pharmacy clearance.  Preop clearance is noted to be needed by [facility] surgery, specifically Dr. Johnathan Hausen at [phone] with fax [phone].  Anesthesia type not listed.  Today, 02/03/2020, she returns to clinic and notes that she is doing well overall from a cardiac standpoint.  She reports that she had quit smoking with Chantix, prescribed by Dr. Rockey Situ at a previous visit.  However, with the stress of COVID-19, she restarted smoking.  She reports a desire to quit with several smoking cessation options discussed, including Chantix.  She denies any recent chest pain with exertion or at rest.  She notes dizziness, which occurred this morning and noted while using the restroom.  She states that, since that time, she has not had recurrent dizziness but feels little unlike herself.  No racing heart rate or palpitations.  She attributes this earlier dizziness to low blood sugar with  recommendation to follow-up with her PCP regarding insulin titration if needed.  No presyncope, syncope, or falls.  She reports an episode of BRBPR that occurred in the setting of constipation and occurred approximately 3 weeks ago without recurrence.  Blood work was checked on 02/02/2020 with hemoglobin and hematocrit stable.  She again denies recurrence and denies any other signs or symptoms of bleeding on DAPT.  She reports that she is able to walk 1 block on level ground from a cardiac standpoint; however, while she would not get any shortness of breath or chest pain, she would be limited by her chronic lower extremity pain.  She reports that she is significantly limited in ambulation due to lower extremity paresthesias and symptoms consistent with claudication.  She notes that she will often go to the kitchen and then feels as if she needs to rest on her way back to the living room.  She is able to climb a flight of stairs without shortness of breath or chest pain but is again limited by her lower extremity pain.  She notes a desire for physical therapy, so that she is able to continue to move without exacerbation of her lower extremity pain.  Further recommendations as below.  She denies any orthopnea, PND, early satiety, or abdominal distention.  EKG today without significant ST or T changes.  She denies any racing heart rate or palpitations.  BP today 128/80 with weight to 95 pounds.  Previous clinic weight 07/04/2018 291 pounds with BP 110/74.  Home Medications    Prior to Admission medications   Medication Sig Start Date End Date Taking? Authorizing Provider  ACCU-CHEK AVIVA PLUS test strip 1 each 3 (three) times daily. 12/31/19   [provider]  Accu-Chek Softclix Lancets lancets 1 each 3 (three) times daily. 01/13/20   [provider]  amLODipine (NORVASC) 5 MG tablet Take 5 mg by mouth daily.  04/21/19   [provider]  aspirin 81 MG EC tablet Take 1 tablet (81 mg total)  by mouth daily. 05/17/17   Loletha Grayer, MD  atorvastatin (LIPITOR) 80 MG tablet TAKE 1 TABLET EVERY DAY  AT  6  PM Patient taking differently: Take 80 mg by mouth daily at 6 PM.  08/13/19   Gollan, Kathlene November, MD  clobetasol (TEMOVATE) 0.05 % external solution Patient to mix entire bottle with 1 jar of CeraVe and apply twice a day x 1 month then as needed. Avoid face, groin, underarms. Patient taking differently: Apply 1 application topically 2 (two) times daily. Patient to mix entire bottle with 1 jar of CeraVe  and apply twice a day x 1 month then as needed. Avoid face, groin, underarms. 10/07/19   Brendolyn Patty, MD  clopidogrel (PLAVIX) 75 MG tablet Take 1 tablet (75 mg total) by mouth daily. 06/26/19   Minna Merritts, MD  DROPLET PEN NEEDLES 31G X 6 MM MISC  11/10/19   [provider]  DULoxetine (CYMBALTA) 60 MG capsule Take 60 mg by mouth daily.  02/25/19   [provider]  Eflornithine HCl 13.9 % cream Apply to upper lip, chin and neck twice a day. Patient taking differently: Apply 1 application topically in the morning and at bedtime. Apply to upper lip, chin and neck twice a day. 10/07/19   Brendolyn Patty, MD  Emollient (CERAVE EX) Apply 1 application topically in the morning and at bedtime. Mixed with clobetasol    [provider]  ferrous sulfate 325 (65 FE) MG tablet Take 325 mg by mouth daily.    [provider]  furosemide (LASIX) 20 MG tablet TAKE 1 TABLET (20 MG TOTAL) BY MOUTH DAILY AS NEEDED. Patient taking differently: Take 20 mg by mouth daily as needed (fluid retention.).  07/07/19   Minna Merritts, MD  LANTUS SOLOSTAR 100 UNIT/ML Solostar Pen Inject 70 Units into the skin in the morning and at bedtime.  11/05/19   [provider]  lidocaine (LIDODERM) 5 % Place 1 patch onto the skin daily as needed (PAIN.).  02/24/19   [provider]  lisinopril (PRINIVIL,ZESTRIL) 5 MG tablet Take 1 tablet (5 mg total) by mouth daily. 03/21/18    McNew, Tyson Babinski, MD  metoprolol tartrate (LOPRESSOR) 25 MG tablet TAKE 1/2 TABLET TWICE DAILY  (  BETA  BLOCKER  ) Patient taking differently: Take 12.5 mg by mouth in the morning and at bedtime.  10/09/19   Gollan, Kathlene November, MD  NOVOLOG FLEXPEN 100 UNIT/ML FlexPen Inject 6 Units into the skin 3 (three) times daily before meals. *HOLD FOR LOW BLOOD GLUCOSE READING* 01/09/20   [provider]  oxybutynin (DITROPAN) 5 MG tablet Take 5 mg by mouth 2 (two) times daily.  05/10/17   [provider]  potassium chloride SA (KLOR-CON) 20 MEQ tablet TAKE 1 TABLET (20 MEQ TOTAL) BY MOUTH DAILY AS NEEDED. Patient taking differently: Take 20 mEq by mouth daily as needed (with lasix (fluid retention)).  07/23/19   Theora Gianotti, NP  pregabalin (LYRICA) 100 MG capsule Take 100 mg by mouth 3 (three) times daily.  06/05/19   [provider]  tiZANidine (ZANAFLEX) 4 MG tablet Take 4 mg by mouth 3 (three) times daily. 06/14/18   [provider]  VICTOZA 18 MG/3ML SOPN Inject 1.2 mg into the skin daily.  01/15/19   [provider]    Review of Systems    She denies chest pain, palpitations, dyspnea, pnd, orthopnea, n, v, syncope, edema, weight gain, or early satiety.  She reports dizziness that occurred earlier today, attributed to low glucose in the setting of her insulin.  She reports bilateral lower extremity paresthesias and claudication symptoms, which limit her ability to ambulate.  Left foot walking shoe.  All other systems reviewed and are otherwise negative except as noted above.  Physical Exam    VS:  BP 128/80 (BP Location: Left Arm, Patient Position: Sitting, Cuff Size: Large)   Pulse 87   Ht 5' 8.5" (1.74 m)   Wt 295 lb 8 oz (134 kg)   SpO2 94%   BMI 44.28 kg/m  ,  BMI Body mass index is 44.28 kg/m. GEN: Well nourished, well developed, in no acute distress. HEENT: normal. Neck: Supple, JVD difficult to assess due to body habitus, no carotid bruits,  or masses. Cardiac: RRR, no murmurs, rubs, or gallops. No clubbing, cyanosis, edema.  Radials/DP/PT 2+ and equal bilaterally.  Respiratory:  Respirations regular and unlabored, clear to auscultation bilaterally with slight expiratory wheeze. GI: Soft, nontender, nondistended, BS + x 4. MS: no deformity or atrophy.  Left foot in walking shoe, as noted on prior exams. Skin: warm and dry, no rash.  NSR, 87 bpm, LVH, Neuro:  Strength and sensation are intact. Psych: Normal affect.  Accessory Clinical Findings    ECG personally reviewed by me today -NSR, 87 bpm, LVH, QRS 112 ms, QTC 459 ms, T wave abnormality noted in inferolateral leads as seen on previous EKGs and improved in comparison to prior tracing- no acute changes.  VITALS Reviewed today   Temp Readings from Last 3 Encounters:  02/02/20 98.6 F (37 C) (Oral)  12/23/18 97.6 F (36.4 C)  12/16/18 (!) 97.2 F (36.2 C)   BP Readings from Last 3 Encounters:  02/03/20 128/80  02/02/20 (!) 157/78  10/28/19 117/69   Pulse Readings from Last 3 Encounters:  02/03/20 87  02/02/20 (!) 8  10/28/19 88    Wt Readings from Last 3 Encounters:  02/03/20 295 lb 8 oz (134 kg)  02/02/20 295 lb (133.8 kg)  01/26/20 295 lb 3.2 oz (133.9 kg)     LABS  reviewed today   Lab Results  Component Value Date   WBC 11.6 (H) 02/02/2020   HGB 10.7 (L) 02/02/2020   HCT 35.5 (L) 02/02/2020   MCV 70.9 (L) 02/02/2020   PLT 380 02/02/2020   Lab Results  Component Value Date   CREATININE 1.27 (H) 02/02/2020   BUN 21 02/02/2020   NA 142 02/02/2020   K 4.3 02/02/2020   CL 107 02/02/2020   CO2 26 02/02/2020   Lab Results  Component Value Date   ALT 12 02/02/2020   AST 11 (L) 02/02/2020   ALKPHOS 104 02/02/2020   BILITOT 0.5 02/02/2020   Lab Results  Component Value Date   CHOL 92 03/14/2018   HDL 39 (L) 03/14/2018   LDLCALC 39 03/14/2018   TRIG 71 03/14/2018   CHOLHDL 2.4 03/14/2018    Lab Results  Component Value Date   HGBA1C  6.7 (H) 02/02/2020   Lab Results  Component Value Date   TSH 0.545 03/14/2018     STUDIES/PROCEDURES reviewed today   ABIs 10/28/2019 Summary:  Right: Monophasic flow throughout suggesting inflow disease. Moderate  atherosclerosis. ABI suggests moderate disease.  Left: Biphasic flow throughout. Moderate atherosclerosis. NL abi.   Lower extremity arterial study 10/28/2019 Summary:  Right: Monophasic flow throughout suggesting inflow disease. Moderate  atherosclerosis. ABI suggests moderate disease.  Left: Biphasic flow throughout. Moderate atherosclerosis. NL abi.   Abdominal aortogram with lower extremity 10/27/2016 Conclusions: 1. Significant bilateral lower extremity PAD, including chronic total occlusion of the right popliteal artery and chronic total occlusion of the left proximal and mid superficial femoral artery. Recommendations: 1. Aggressive medical therapy and referral to supervise walking program. 2. If patient continues to have lifestyle limiting claudication, revascularization of the left SFA chronic total occlusion could not be attempted. Right popliteal CTO is more challenging to revascularize. Study will be reviewed with the PV team regarding potential for endovascular versus surgical revascularization. 3. Return to clinic next week for basic metabolic  panel to ensure stable renal function and electrolytes. Nelva Bush, MD [name]  Echo 2016 - Left ventricle: The cavity size was normal. There was moderate  concentric hypertrophy. Systolic function was mildly to  moderately reduced. The estimated ejection fraction was in the  range of 40% to 45%. Severe hypokinesis of the  entireinferolateral and inferior myocardium; consistent with  infarction in the distribution of the right coronary or left  circumflex coronary artery. Features are consistent with a  pseudonormal left ventricular filling pattern, with concomitant  abnormal  relaxation and increased filling pressure (grade 2  diastolic dysfunction).  - Mitral valve: There was mild regurgitation.     Assessment & Plan    Preoperative evaluation, gastric bypass surgery --Scheduled 02/09/2020 for gastric bypass surgery with [place] surgery/Dr. Johnathan Hausen.  Phone [phone].  Fax [phone].  Anesthesia type not listed for procedure. -No active cardiac conditions, including recent cardiac event toward signs or symptoms of decompensated heart failure.  EKG without evidence of significant arrhythmia.  Previous 2016 echo without severe valvular disease. --Clinical risk factors include h/o HFrEF, diabetes on insulin, history of ischemic heart disease.  Of note, recent labs show creatinine 1.27 and thus under cutoff of 2 mg/DL. --Current functional capacity ~4 METS and poor.  She is able to complete ADLs, walk indoors, and walk 1-2 level blocks.  She is significantly limited by claudication and paresthesias of her lower extremities.  She feels today she is able to climb a flight of stairs if not limited by her lower extremity symptoms.   --Surgery specific risk intermediate at 1 to 5%, given gastric bypass surgery. --EKG without significant ST/T changes.   --Repeat echo ordered with previous 2016 echo showing EF 40 to 45%.   --Will order updated echo as recommended by primary cardiologist at previous visit.  Discussed with primary cardiologist same day with recommendation to proceed to surgery without obtaining echo, as it would likely not change management, and given her lack of active or changing symptoms. Lexiscan stress testing is also noted to not be an ideal study due to likely reduced quality imaging and will likely not change management and surgery should not be delayed to obtain this imaging. --RCRI risk calculated as class IV risk, 11% risk of major cardiac event.   --As above, further cardiac testing discussed with primary cardiologist, and  recommendation was to proceed to surgery without further cardiac work-up.  We will obtain her repeat echo after her gastric bypass surgery. --Discussed current DAPT therapy, given timing of upcoming procedure.  Per primary cardiologist, reasonable to hold Plavix and ASA 81 mg daily starting today and with restart per surgery, as long as this timeline is also agreeable to surgery.  Coronary artery disease involving native coronary artery of native heart without angina S/p CABG x4 --As above, no current symptoms concerning for angina or anginal equivalent.  Continue ongoing risk factor modification, including glycemic control.  Continue current Lipitor.  She will hold her DAPT prior to her upcoming gastric bypass surgery with restart per surgery as soon as felt safe to do so and given her history of CAD.  Discussed smoking cessation with patient willing to restart Chantix and stated that we can provide her with this prescription once surgery clears her to start this medication after her gastric bypass.  Essential hypertension --BP today controlled to borderline at 128/80 with goal BP less than 130/80.  She will continue to monitor her pressure going forward.  Discussed low-salt diet and  fluid restriction.  Update echo as above.  Appears euvolemic on exam.  No medication changes at this time.  ICM/HFrEF --No shortness of breath or change in baseline dyspnea.  Appears euvolemic on exam.  Denies any signs or symptoms of worsening heart failure.  Repeat echo as above for updated EF, valvular function, heart pressures, and to rule out any wall motion abnormality.  Claudication --Recent lower extremity vascular studies as above.  She is monitored closely for PAD with h/o recent intervention to her lower extremities.  Encouraged smoking cessation.  As above, she will discuss Chantix with her surgical team and reach out if able to start this after her procedure/gastric bypass surgery.  DM2 --Stressed glycemic  control.  Earlier episode of dizziness reported per patient in the setting of hypoglycemia with recommendation that she reach out to her primary care physician for adjustments to her insulin as indicated.  Ongoing dietary changes.  Continue to follow with PCP.  Continue current insulin.  Tobacco use --As above in HPI, she was able to quit smoking on Chantix; however, during COVID-19, she restarted smoking and is smoking 14 cigarettes/day as previously noted.  Discussed various strategies for quitting with patient preference to restart Chantix once surgery feels this is safe to restart.  She will contact the office once cleared for Chantix.  Medication changes: Hold DAPT now and until surgery, which was reviewed with her primary cardiologist.  Restart DAPT after surgery once surgical team feels safe to do so. Labs ordered: None.  See above for recent CBC and BMET as ordered by surgical team. Studies / Imaging ordered: Echo.  Discussed case with primary cardiologist with recommendation to proceed with surgery and obtain echo following gastric bypass.  Will obtain echo after her surgery with patient understanding. Future considerations: Chantix.  Discussed that Chantix will be provided once surgical team indicates that it is safe for her to start this medication after her procedure. Disposition: RTC after gastric bypass.    Arvil Chaco, [name] 02/03/2020

## 2020-02-03 ENCOUNTER — Encounter: Payer: Self-pay | Admitting: Physician Assistant

## 2020-02-03 ENCOUNTER — Ambulatory Visit (INDEPENDENT_AMBULATORY_CARE_PROVIDER_SITE_OTHER): Payer: Medicare HMO | Admitting: Physician Assistant

## 2020-02-03 VITALS — BP 128/80 | HR 87 | Ht 68.5 in | Wt 295.5 lb

## 2020-02-03 DIAGNOSIS — L97909 Non-pressure chronic ulcer of unspecified part of unspecified lower leg with unspecified severity: Secondary | ICD-10-CM

## 2020-02-03 DIAGNOSIS — Z6841 Body Mass Index (BMI) 40.0 and over, adult: Secondary | ICD-10-CM

## 2020-02-03 DIAGNOSIS — I255 Ischemic cardiomyopathy: Secondary | ICD-10-CM

## 2020-02-03 DIAGNOSIS — G59 Mononeuropathy in diseases classified elsewhere: Secondary | ICD-10-CM

## 2020-02-03 DIAGNOSIS — Z951 Presence of aortocoronary bypass graft: Secondary | ICD-10-CM | POA: Diagnosis not present

## 2020-02-03 DIAGNOSIS — Z794 Long term (current) use of insulin: Secondary | ICD-10-CM

## 2020-02-03 DIAGNOSIS — Z720 Tobacco use: Secondary | ICD-10-CM

## 2020-02-03 DIAGNOSIS — I739 Peripheral vascular disease, unspecified: Secondary | ICD-10-CM

## 2020-02-03 DIAGNOSIS — I25118 Atherosclerotic heart disease of native coronary artery with other forms of angina pectoris: Secondary | ICD-10-CM

## 2020-02-03 DIAGNOSIS — I5020 Unspecified systolic (congestive) heart failure: Secondary | ICD-10-CM

## 2020-02-03 DIAGNOSIS — E785 Hyperlipidemia, unspecified: Secondary | ICD-10-CM

## 2020-02-03 DIAGNOSIS — I70299 Other atherosclerosis of native arteries of extremities, unspecified extremity: Secondary | ICD-10-CM

## 2020-02-03 DIAGNOSIS — E119 Type 2 diabetes mellitus without complications: Secondary | ICD-10-CM

## 2020-02-03 DIAGNOSIS — Z01810 Encounter for preprocedural cardiovascular examination: Secondary | ICD-10-CM

## 2020-02-03 NOTE — Patient Instructions (Signed)
Medication Instructions:   Your physician recommends that you continue on your current medications as directed. Please refer to the Current Medication list given to you today.  1. Please continue to hold Aspirin and Plavix for your upcoming surgery. You may resume when your performing surgeon says it is safe to do so.   2. After surgery please ask surgeon when it is safe to start Leona - give [name] a call and we will send that in to your pharmacy.    *If you need a refill on your cardiac medications before your next appointment, please call your pharmacy*   Lab Work:  1. None Ordered.   If you have labs (blood work) drawn today and your tests are completely normal, you will receive your results only by: Marland Kitchen MyChart Message (if you have MyChart) OR . A paper copy in the mail If you have any lab test that is abnormal or we need to change your treatment, we will call you to review the results.   Testing/Procedures:  1. Echocardiogram - After Surgery Please return to [facility] on ______________ at _______________ AM/PM for an Echocardiogram. Your physician has requested that you have an echocardiogram. Echocardiography is a painless test that uses sound waves to create images of your heart. It provides your doctor with information about the size and shape of your heart and how well your heart's chambers and valves are working. This procedure takes approximately one hour. There are no restrictions for this procedure. Please note; depending on visual quality an IV may need to be placed.     Follow-Up: At [facility], you and your health needs are our priority.  As part of our continuing mission to provide you with exceptional heart care, we have created designated Provider Care Teams.  These Care Teams include your primary Cardiologist (physician) and Advanced Practice Providers (APPs -  Physician Assistants and Nurse Practitioners) who all work together to provide you with  the care you need, when you need it.  We recommend signing up for the patient portal called "MyChart".  Sign up information is provided on this After Visit Summary.  MyChart is used to connect with patients for Virtual Visits (Telemedicine).  Patients are able to view lab/test results, encounter notes, upcoming appointments, etc.  Non-urgent messages can be sent to your provider as well.   To learn more about what you can do with MyChart, go to [email or web address].    Your next appointment:    After Echo is Complete  The format for your next appointment:   In Person  Provider:   You may see Ida Rogue, MD or one of the following Advanced Practice Providers on your designated Care Team:     Marrianne Mood, [place]

## 2020-02-05 ENCOUNTER — Other Ambulatory Visit (HOSPITAL_COMMUNITY)
Admission: RE | Admit: 2020-02-05 | Discharge: 2020-02-05 | Disposition: A | Discharge status: Home / Self Care | Payer: Medicare HMO | Source: Ambulatory Visit | Attending: Surgery | Admitting: Surgery

## 2020-02-05 DIAGNOSIS — Z01818 Encounter for other preprocedural examination: Secondary | ICD-10-CM | POA: Insufficient documentation

## 2020-02-05 DIAGNOSIS — Z20822 Contact with and (suspected) exposure to covid-19: Secondary | ICD-10-CM | POA: Insufficient documentation

## 2020-02-05 LAB — SARS CORONAVIRUS 2 (TAT 6-24 HRS): SARS Coronavirus 2: NEGATIVE

## 2020-02-05 NOTE — Progress Notes (Signed)
Anesthesia Chart Review   Case: [id number] Date/Time: 02/09/20 0945   Procedures:      LAPAROSCOPIC ROUX-EN-Y GASTRIC BYPASS WITH UPPER ENDOSCOPY (N/A )     UPPER GI ENDOSCOPY (N/A )   Anesthesia type: General   Pre-op diagnosis: morbid obesity   [facility]   Surgeons: Johnathan Hausen, MD      DISCUSSION:64 y.o. current some day smoker (30 pack years) with h/o HTN, GERD, PVD, DM II insulin dependent, CAD (s/p CABG x 4 2016), PVD, morbid obesity scheduled for above procedure 02/09/2020 with Dr. Johnathan Hausen.   Pt last seen by cardiology 02/03/2020.  Per OV note, "-No active cardiac conditions, including recent cardiac event toward signs or symptoms of decompensated heart failure.  EKG without evidence of significant arrhythmia.  Previous 2016 echo without severe valvular disease. --Clinical risk factors include h/o HFrEF, diabetes on insulin, history of ischemic heart disease.  Of note, recent labs show creatinine 1.27 and thus under cutoff of 2 mg/DL. --Current functional capacity ~4 METS and poor.  She is able to complete ADLs, walk indoors, and walk 1-2 level blocks.  She is significantly limited by claudication and paresthesias of her lower extremities.  She feels today she is able to climb a flight of stairs if not limited by her lower extremity symptoms.   --Surgery specific risk intermediate at 1 to 5%, given gastric bypass surgery. --EKG without significant ST/T changes.   --Repeat echo ordered with previous 2016 echo showing EF 40 to 45%.   --Will order updated echo as recommended by primary cardiologist at previous visit.  Discussed with primary cardiologist same day with recommendation to proceed to surgery without obtaining echo, as it would likely not change management, and given her lack of active or changing symptoms. Lexiscan stress testing is also noted to not be an ideal study due to likely reduced quality imaging and will likely not change management and  surgery should not be delayed to obtain this imaging. --RCRI risk calculated as class IV risk, 11% risk of major cardiac event.   --As above, further cardiac testing discussed with primary cardiologist, and recommendation was to proceed to surgery without further cardiac work-up.  We will obtain her repeat echo after her gastric bypass surgery. --Discussed current DAPT therapy, given timing of upcoming procedure.  Per primary cardiologist, reasonable to hold Plavix and ASA 81 mg daily starting today and with restart per surgery, as long as this timeline is also agreeable to surgery."  Anticipate pt can proceed with planned procedure barring acute status change.   VS: BP (!) 157/78   Pulse (!) 8   Temp 37 C (Oral)   Resp 20   Ht 5' 8.5" (1.74 m)   Wt 133.8 kg   SpO2 100%   BMI 44.20 kg/m   PROVIDERS: Donnie Coffin, MD  Is PCP   Ida Rogue, MD is Cardiologist  LABS: Labs reviewed: Acceptable for surgery. (all labs ordered are listed, but only abnormal results are displayed)  Labs Reviewed  HEMOGLOBIN A1C - Abnormal; Notable for the following components:      Result Value   Hgb A1c MFr Bld 6.7 (*)    All other components within normal limits  CBC WITH DIFFERENTIAL/PLATELET - Abnormal; Notable for the following components:   WBC 11.6 (*)    Hemoglobin 10.7 (*)    HCT 35.5 (*)    MCV 70.9 (*)    MCH 21.4 (*)    RDW  24.9 (*)    All other components within normal limits  COMPREHENSIVE METABOLIC PANEL - Abnormal; Notable for the following components:   Creatinine, Ser 1.27 (*)    AST 11 (*)    GFR calc non Af Amer 44 (*)    GFR calc Af Amer 51 (*)    All other components within normal limits  GLUCOSE, CAPILLARY - Abnormal; Notable for the following components:   Glucose-Capillary 60 (*)    All other components within normal limits  GLUCOSE, CAPILLARY  TYPE AND SCREEN     IMAGES:   EKG: 07/04/2019 Rate 99 bpm  NSR Possible Inferior infarct, age undetermined ST &  T wave abnormality, consider lateral ischemia   CV: Echo 06/04/2014 Study Conclusions   - Left ventricle: The cavity size was normal. There was moderate  concentric hypertrophy. Systolic function was mildly to  moderately reduced. The estimated ejection fraction was in the  range of 40% to 45%. Severe hypokinesis of the  entireinferolateral and inferior myocardium; consistent with  infarction in the distribution of the right coronary or left  circumflex coronary artery. Features are consistent with a  pseudonormal left ventricular filling pattern, with concomitant  abnormal relaxation and increased filling pressure (grade 2  diastolic dysfunction).  - Mitral valve: There was mild regurgitation.   Past Medical History:  Diagnosis Date  . Anxiety   . Coronary artery disease    a. 06/2014 NSTEMI s/p CABG x 4 (LIMA to LAD, VG to Diag, VG to OM, VG to PDA).  . Depression   . Diabetic neuropathy ([place])   . GERD (gastroesophageal reflux disease)   . HTN (hypertension)   . Hyperlipidemia LDL goal <70   . IDDM (insulin dependent diabetes mellitus)   . Ischemic cardiomyopathy    a. 06/2014 Echo: EF 40-45%, HK of entire inferolateral and inferior myocardium c/w infarct of RCA/LCx, GR2DD, mild MR  . Myocardial infarction ([place]) 2016  . OA (osteoarthritis) of knee   . Obesity   . Osteoarthritis   . Osteomyelitis ([place])   . Peripheral neuropathy   . Peripheral vascular disease ([place])    a. 09/2016 Periph Angio: CTO R popliteal, CTO L prox/mid SFA->Med Rx; b. 05/2017 PTA: LSFA (Viabahn covered stent x 2), DEB to L Post Tibial; c. 06/2017 ABI: R 0.44, L 1.05.  . Tobacco abuse     Past Surgical History:  Procedure Laterality Date  . ABDOMINAL AORTOGRAM W/LOWER EXTREMITY N/A 10/27/2016   Procedure: Abdominal Aortogram w/Lower Extremity;  Surgeon: Nelva Bush, MD;  Location: [place] [facility];  Service: Cardiovascular;  Laterality: N/A;  . AMPUTATION TOE Left 10/21/2015    Procedure: AMPUTATION TOE;  Surgeon: Sharlotte Alamo, DPM;  Location: [facility];  Service: Podiatry;  Laterality: Left;  . AMPUTATION TOE Left 05/15/2017   Procedure: AMPUTATION LEFT GREAT TOE;  Surgeon: Sharlotte Alamo, DPM;  Location: [facility];  Service: Podiatry;  Laterality: Left;  . AORTIC VALVE REPLACEMENT (AVR)/CORONARY ARTERY BYPASS GRAFTING (CABG)    . BREAST BIOPSY    . CARDIAC CATHETERIZATION  06/2014   95% stenosis mLAD, occlusion ostial OM1, 70% stenosis LCx, 95% stenosis mRCA, EF 45%.  . CORONARY ARTERY BYPASS GRAFT N/A 06/08/2014   Procedure: CORONARY ARTERY BYPASS GRAFTING (CABG);  Surgeon: Grace Isaac, MD;  Location: [place];  Service: Open Heart Surgery;  Laterality: N/A;  Times 4 using left internal mammary artery to LAD artery and endoscopically harvested bilateral saphenous vein to Obtuse Marginal, Diagonal and Posterior Descending coronary  arteries.  . CT ABD W & PELVIS WO CM  06/2014   nl liver, gallbladder, spleen, mild diverticular changes, no bowel wall inflammation, appendix nl, no hernia, no other sig abnormalities  . LOWER EXTREMITY ANGIOGRAPHY Left 05/23/2017   Procedure: LOWER EXTREMITY ANGIOGRAPHY;  Surgeon: Algernon Huxley, MD;  Location: [place] [facility];  Service: Cardiovascular;  Laterality: Left;  . LOWER EXTREMITY ANGIOGRAPHY Left 05/28/2017   Procedure: LOWER EXTREMITY ANGIOGRAPHY;  Surgeon: Algernon Huxley, MD;  Location: [place] [facility];  Service: Cardiovascular;  Laterality: Left;  . LOWER EXTREMITY ANGIOGRAPHY Left 12/16/2018   Procedure: LOWER EXTREMITY ANGIOGRAPHY;  Surgeon: Algernon Huxley, MD;  Location: [place] [facility];  Service: Cardiovascular;  Laterality: Left;  . LOWER EXTREMITY ANGIOGRAPHY Right 12/23/2018   Procedure: LOWER EXTREMITY ANGIOGRAPHY;  Surgeon: Algernon Huxley, MD;  Location: [place] [facility];  Service: Cardiovascular;  Laterality: Right;  . TEE WITHOUT CARDIOVERSION N/A 06/08/2014   Procedure: TRANSESOPHAGEAL ECHOCARDIOGRAM (TEE);   Surgeon: Grace Isaac, MD;  Location: [place];  Service: Open Heart Surgery;  Laterality: N/A;  . TOE AMPUTATION Right 12/2011   rt middle toe  . US ECHOCARDIOGRAPHY  06/2014   EF 50-55%, HK of inf/post/inferolat walls, Ao sclerosis    MEDICATIONS: . ACCU-CHEK AVIVA PLUS test strip  . Accu-Chek Softclix Lancets lancets  . amLODipine (NORVASC) 5 MG tablet  . aspirin 81 MG EC tablet  . atorvastatin (LIPITOR) 80 MG tablet  . atorvastatin (LIPITOR) 80 MG tablet  . clobetasol (TEMOVATE) 0.05 % external solution  . clobetasol (TEMOVATE) 0.05 % external solution  . clopidogrel (PLAVIX) 75 MG tablet  . DROPLET PEN NEEDLES 31G X 6 MM MISC  . DULoxetine (CYMBALTA) 60 MG capsule  . Eflornithine HCl 13.9 % cream  . Eflornithine HCl 13.9 % cream  . Emollient (CERAVE EX)  . ferrous sulfate 325 (65 FE) MG tablet  . furosemide (LASIX) 20 MG tablet  . furosemide (LASIX) 20 MG tablet  . LANTUS SOLOSTAR 100 UNIT/ML Solostar Pen  . lidocaine (LIDODERM) 5 %  . lisinopril (PRINIVIL,ZESTRIL) 5 MG tablet  . metoprolol tartrate (LOPRESSOR) 25 MG tablet  . metoprolol tartrate (LOPRESSOR) 25 MG tablet  . NOVOLOG FLEXPEN 100 UNIT/ML FlexPen  . oxybutynin (DITROPAN) 5 MG tablet  . potassium chloride SA (KLOR-CON) 20 MEQ tablet  . potassium chloride SA (KLOR-CON) 20 MEQ tablet  . pregabalin (LYRICA) 100 MG capsule  . tiZANidine (ZANAFLEX) 4 MG tablet  . VICTOZA 18 MG/3ML SOPN   No current facility-administered medications for this encounter.     Konrad Felix, [name] [facility] [phone]

## 2020-02-05 NOTE — Anesthesia Preprocedure Evaluation (Addendum)
Anesthesia Evaluation  Patient identified by MRN, date of birth, ID band Patient awake    Reviewed: Allergy & Precautions, NPO status , Patient's Chart, lab work & pertinent test results  History of Anesthesia Complications Negative for: history of anesthetic complications  Airway Mallampati: III  TM Distance: >3 FB Neck ROM: Full    Dental  (+) Dental Advisory Given, Upper Dentures   Pulmonary Current SmokerPatient did not abstain from smoking.,    Pulmonary exam normal breath sounds clear to auscultation       Cardiovascular hypertension, Pt. on medications and Pt. on home beta blockers (-) angina+ CAD, + Past MI, + CABG and + Peripheral Vascular Disease  Normal cardiovascular exam Rhythm:Regular Rate:Normal     Neuro/Psych PSYCHIATRIC DISORDERS Anxiety Depression  Neuromuscular disease    GI/Hepatic GERD  ,(+)     substance abuse  cocaine use,   Endo/Other  diabetes, Type 2, Insulin DependentMorbid obesity  Renal/GU Renal InsufficiencyRenal disease     Musculoskeletal  (+) Arthritis ,   Abdominal   Peds  Hematology negative hematology ROS (+)   Anesthesia Other Findings   Reproductive/Obstetrics                           Anesthesia Physical Anesthesia Plan  ASA: III  Anesthesia Plan: General   Post-op Pain Management:    Induction: Intravenous  PONV Risk Score and Plan: 3 and Midazolam, Dexamethasone and Ondansetron  Airway Management Planned: Oral ETT  Additional Equipment:   Intra-op Plan:   Post-operative Plan: Extubation in OR  Informed Consent: I have reviewed the patients History and Physical, chart, labs and discussed the procedure including the risks, benefits and alternatives for the proposed anesthesia with the patient or authorized representative who has indicated his/her understanding and acceptance.       Plan Discussed with: CRNA  Anesthesia Plan  Comments: (See PAT note 02/02/2020, Konrad Felix, [name])      Anesthesia Quick Evaluation

## 2020-02-09 ENCOUNTER — Inpatient Hospital Stay (HOSPITAL_COMMUNITY)
Admission: RE | Admit: 2020-02-09 | Discharge: 2020-02-11 | DRG: 620 | Disposition: A | Discharge status: Home / Self Care | Payer: Medicare HMO | Attending: Surgery | Admitting: Surgery

## 2020-02-09 ENCOUNTER — Encounter (HOSPITAL_COMMUNITY)
Admission: RE | Disposition: A | Discharge status: Home / Self Care | Payer: Self-pay | Source: Home / Self Care | Attending: Surgery

## 2020-02-09 ENCOUNTER — Inpatient Hospital Stay (HOSPITAL_COMMUNITY): Payer: Medicare HMO | Admitting: Anesthesiology

## 2020-02-09 ENCOUNTER — Encounter (HOSPITAL_COMMUNITY): Payer: Self-pay | Admitting: Surgery

## 2020-02-09 ENCOUNTER — Inpatient Hospital Stay (HOSPITAL_COMMUNITY): Payer: Medicare HMO | Admitting: Physician Assistant

## 2020-02-09 ENCOUNTER — Other Ambulatory Visit: Payer: Self-pay

## 2020-02-09 DIAGNOSIS — Z79899 Other long term (current) drug therapy: Secondary | ICD-10-CM | POA: Diagnosis not present

## 2020-02-09 DIAGNOSIS — Z794 Long term (current) use of insulin: Secondary | ICD-10-CM | POA: Diagnosis not present

## 2020-02-09 DIAGNOSIS — Z6841 Body Mass Index (BMI) 40.0 and over, adult: Secondary | ICD-10-CM

## 2020-02-09 DIAGNOSIS — I2510 Atherosclerotic heart disease of native coronary artery without angina pectoris: Secondary | ICD-10-CM | POA: Diagnosis present

## 2020-02-09 DIAGNOSIS — F17210 Nicotine dependence, cigarettes, uncomplicated: Secondary | ICD-10-CM | POA: Diagnosis present

## 2020-02-09 DIAGNOSIS — Z7902 Long term (current) use of antithrombotics/antiplatelets: Secondary | ICD-10-CM

## 2020-02-09 DIAGNOSIS — Z7982 Long term (current) use of aspirin: Secondary | ICD-10-CM

## 2020-02-09 DIAGNOSIS — E1151 Type 2 diabetes mellitus with diabetic peripheral angiopathy without gangrene: Secondary | ICD-10-CM | POA: Diagnosis present

## 2020-02-09 DIAGNOSIS — I252 Old myocardial infarction: Secondary | ICD-10-CM | POA: Diagnosis not present

## 2020-02-09 DIAGNOSIS — I119 Hypertensive heart disease without heart failure: Secondary | ICD-10-CM | POA: Diagnosis present

## 2020-02-09 DIAGNOSIS — E8881 Metabolic syndrome: Secondary | ICD-10-CM | POA: Diagnosis present

## 2020-02-09 DIAGNOSIS — Z89412 Acquired absence of left great toe: Secondary | ICD-10-CM

## 2020-02-09 DIAGNOSIS — I255 Ischemic cardiomyopathy: Secondary | ICD-10-CM | POA: Diagnosis present

## 2020-02-09 DIAGNOSIS — Z8249 Family history of ischemic heart disease and other diseases of the circulatory system: Secondary | ICD-10-CM | POA: Diagnosis not present

## 2020-02-09 DIAGNOSIS — Z89422 Acquired absence of other left toe(s): Secondary | ICD-10-CM | POA: Diagnosis not present

## 2020-02-09 DIAGNOSIS — Z89421 Acquired absence of other right toe(s): Secondary | ICD-10-CM | POA: Diagnosis not present

## 2020-02-09 DIAGNOSIS — E785 Hyperlipidemia, unspecified: Secondary | ICD-10-CM | POA: Diagnosis present

## 2020-02-09 DIAGNOSIS — Z833 Family history of diabetes mellitus: Secondary | ICD-10-CM | POA: Diagnosis not present

## 2020-02-09 DIAGNOSIS — Z951 Presence of aortocoronary bypass graft: Secondary | ICD-10-CM | POA: Diagnosis not present

## 2020-02-09 DIAGNOSIS — E1142 Type 2 diabetes mellitus with diabetic polyneuropathy: Secondary | ICD-10-CM | POA: Diagnosis present

## 2020-02-09 DIAGNOSIS — G4733 Obstructive sleep apnea (adult) (pediatric): Secondary | ICD-10-CM | POA: Diagnosis present

## 2020-02-09 DIAGNOSIS — F339 Major depressive disorder, recurrent, unspecified: Secondary | ICD-10-CM | POA: Diagnosis present

## 2020-02-09 DIAGNOSIS — Z9884 Bariatric surgery status: Secondary | ICD-10-CM

## 2020-02-09 HISTORY — PX: GASTRIC ROUX-EN-Y: SHX5262

## 2020-02-09 HISTORY — PX: UPPER GI ENDOSCOPY: SHX6162

## 2020-02-09 LAB — TYPE AND SCREEN
ABO/RH(D): O POS
Antibody Screen: NEGATIVE

## 2020-02-09 LAB — CBC
HCT: 37.7 % (ref 36.0–46.0)
Hemoglobin: 11.1 g/dL — ABNORMAL LOW (ref 12.0–15.0)
MCH: 21.1 pg — ABNORMAL LOW (ref 26.0–34.0)
MCHC: 29.4 g/dL — ABNORMAL LOW (ref 30.0–36.0)
MCV: 71.5 fL — ABNORMAL LOW (ref 80.0–100.0)
Platelets: 377 10*3/uL (ref 150–400)
RBC: 5.27 MIL/uL — ABNORMAL HIGH (ref 3.87–5.11)
RDW: 24.3 % — ABNORMAL HIGH (ref 11.5–15.5)
WBC: 12.9 10*3/uL — ABNORMAL HIGH (ref 4.0–10.5)
nRBC: 0 % (ref 0.0–0.2)

## 2020-02-09 LAB — GLUCOSE, CAPILLARY
Glucose-Capillary: 101 mg/dL — ABNORMAL HIGH (ref 70–99)
Glucose-Capillary: 104 mg/dL — ABNORMAL HIGH (ref 70–99)
Glucose-Capillary: 111 mg/dL — ABNORMAL HIGH (ref 70–99)
Glucose-Capillary: 163 mg/dL — ABNORMAL HIGH (ref 70–99)
Glucose-Capillary: 208 mg/dL — ABNORMAL HIGH (ref 70–99)
Glucose-Capillary: 54 mg/dL — ABNORMAL LOW (ref 70–99)
Glucose-Capillary: 66 mg/dL — ABNORMAL LOW (ref 70–99)
Glucose-Capillary: 73 mg/dL (ref 70–99)
Glucose-Capillary: 94 mg/dL (ref 70–99)
Glucose-Capillary: 99 mg/dL (ref 70–99)

## 2020-02-09 LAB — CREATININE, SERUM
Creatinine, Ser: 1.48 mg/dL — ABNORMAL HIGH (ref 0.44–1.00)
GFR, Estimated: 37 mL/min — ABNORMAL LOW (ref >60)

## 2020-02-09 SURGERY — LAPAROSCOPIC ROUX-EN-Y GASTRIC BYPASS WITH UPPER ENDOSCOPY
Anesthesia: General

## 2020-02-09 MED ORDER — HEPARIN SODIUM (PORCINE) 5000 UNIT/ML IJ SOLN
5000.0000 [IU] | Freq: Three times a day (TID) | INTRAMUSCULAR | Status: DC
  Administered 2020-02-09 – 2020-02-11 (×6): 5000 [IU] via SUBCUTANEOUS
  Filled 2020-02-09 (×6): qty 1

## 2020-02-09 MED ORDER — ACETAMINOPHEN 160 MG/5ML PO SOLN
1000.0000 mg | Freq: Three times a day (TID) | ORAL | Status: DC
  Administered 2020-02-09 – 2020-02-10 (×3): 1000 mg via ORAL
  Filled 2020-02-09 (×3): qty 40.6

## 2020-02-09 MED ORDER — PANTOPRAZOLE SODIUM 40 MG IV SOLR
40.0000 mg | Freq: Every day | INTRAVENOUS | Status: DC
  Administered 2020-02-09: 21:00:00 40 mg via INTRAVENOUS
  Filled 2020-02-09: qty 40

## 2020-02-09 MED ORDER — ROCURONIUM BROMIDE 10 MG/ML (PF) SYRINGE
PREFILLED_SYRINGE | INTRAVENOUS | Status: AC
  Filled 2020-02-09: qty 10

## 2020-02-09 MED ORDER — APREPITANT 40 MG PO CAPS
ORAL_CAPSULE | ORAL | Status: AC
  Administered 2020-02-09: 40 mg via ORAL
  Filled 2020-02-09: qty 1

## 2020-02-09 MED ORDER — ONDANSETRON HCL 4 MG/2ML IJ SOLN: 4.0000 mg | Freq: Once | INTRAMUSCULAR | Status: DC | PRN

## 2020-02-09 MED ORDER — SODIUM CHLORIDE 0.9 % IV SOLN
2.0000 g | INTRAVENOUS | Status: AC
  Administered 2020-02-09: 2 g via INTRAVENOUS

## 2020-02-09 MED ORDER — KETAMINE HCL 10 MG/ML IJ SOLN
INTRAMUSCULAR | Status: DC | PRN
  Administered 2020-02-09: 35 mg via INTRAVENOUS

## 2020-02-09 MED ORDER — APREPITANT 40 MG PO CAPS: 40.0000 mg | ORAL_CAPSULE | ORAL | Status: AC

## 2020-02-09 MED ORDER — ONDANSETRON HCL 4 MG/2ML IJ SOLN
INTRAMUSCULAR | Status: AC
  Filled 2020-02-09: qty 2

## 2020-02-09 MED ORDER — MEPERIDINE HCL 50 MG/ML IJ SOLN: 6.2500 mg | INTRAMUSCULAR | Status: DC | PRN

## 2020-02-09 MED ORDER — OXYCODONE HCL 5 MG/5ML PO SOLN
5.0000 mg | Freq: Four times a day (QID) | ORAL | Status: DC | PRN
  Administered 2020-02-09 – 2020-02-11 (×6): 5 mg via ORAL
  Filled 2020-02-09 (×6): qty 5

## 2020-02-09 MED ORDER — TISSEEL VH 10 ML EX KIT
PACK | CUTANEOUS | Status: DC | PRN
  Administered 2020-02-09: 1

## 2020-02-09 MED ORDER — INSULIN ASPART 100 UNIT/ML ~~LOC~~ SOLN
0.0000 [IU] | SUBCUTANEOUS | Status: DC
  Administered 2020-02-09: 4 [IU] via SUBCUTANEOUS
  Administered 2020-02-09: 7 [IU] via SUBCUTANEOUS
  Administered 2020-02-10 (×3): 4 [IU] via SUBCUTANEOUS
  Administered 2020-02-10: 13:00:00 3 [IU] via SUBCUTANEOUS
  Administered 2020-02-10: 4 [IU] via SUBCUTANEOUS
  Administered 2020-02-11 (×3): 3 [IU] via SUBCUTANEOUS

## 2020-02-09 MED ORDER — ONDANSETRON HCL 4 MG/2ML IJ SOLN
INTRAMUSCULAR | Status: DC | PRN
  Administered 2020-02-09: 4 mg via INTRAVENOUS

## 2020-02-09 MED ORDER — LIDOCAINE 20MG/ML (2%) 15 ML SYRINGE OPTIME
INTRAMUSCULAR | Status: DC | PRN
  Administered 2020-02-09: 1.5 mg/kg/h via INTRAVENOUS

## 2020-02-09 MED ORDER — MIDAZOLAM HCL 2 MG/2ML IJ SOLN
INTRAMUSCULAR | Status: DC | PRN
  Administered 2020-02-09: 1 mg via INTRAVENOUS

## 2020-02-09 MED ORDER — FENTANYL CITRATE (PF) 250 MCG/5ML IJ SOLN
INTRAMUSCULAR | Status: AC
  Filled 2020-02-09: qty 5

## 2020-02-09 MED ORDER — PHENYLEPHRINE HCL (PRESSORS) 10 MG/ML IV SOLN
INTRAVENOUS | Status: DC | PRN
  Administered 2020-02-09 (×5): 80 ug via INTRAVENOUS

## 2020-02-09 MED ORDER — GABAPENTIN 300 MG PO CAPS
ORAL_CAPSULE | ORAL | Status: AC
  Administered 2020-02-09: 300 mg via ORAL
  Filled 2020-02-09: qty 1

## 2020-02-09 MED ORDER — ORAL CARE MOUTH RINSE: 15.0000 mL | Freq: Once | OROMUCOSAL | Status: AC

## 2020-02-09 MED ORDER — KCL IN DEXTROSE-NACL 20-5-0.45 MEQ/L-%-% IV SOLN
INTRAVENOUS | Status: DC
  Filled 2020-02-09 (×3): qty 1000

## 2020-02-09 MED ORDER — SUGAMMADEX SODIUM 200 MG/2ML IV SOLN
INTRAVENOUS | Status: DC | PRN
  Administered 2020-02-09: 300 mg via INTRAVENOUS

## 2020-02-09 MED ORDER — LACTATED RINGERS IV SOLN: INTRAVENOUS | Status: DC

## 2020-02-09 MED ORDER — SODIUM CHLORIDE 0.9 % IV SOLN
INTRAVENOUS | Status: AC
  Filled 2020-02-09: qty 2

## 2020-02-09 MED ORDER — PROPOFOL 10 MG/ML IV BOLUS
INTRAVENOUS | Status: AC
  Filled 2020-02-09: qty 20

## 2020-02-09 MED ORDER — PHENYLEPHRINE HCL (PRESSORS) 10 MG/ML IV SOLN
INTRAVENOUS | Status: AC
  Filled 2020-02-09: qty 1

## 2020-02-09 MED ORDER — CHLORHEXIDINE GLUCONATE CLOTH 2 % EX PADS: 6.0000 | MEDICATED_PAD | Freq: Once | CUTANEOUS | Status: DC

## 2020-02-09 MED ORDER — ONDANSETRON HCL 4 MG/2ML IJ SOLN: 4.0000 mg | INTRAMUSCULAR | Status: DC | PRN

## 2020-02-09 MED ORDER — PHENYLEPHRINE HCL-NACL 10-0.9 MG/250ML-% IV SOLN
INTRAVENOUS | Status: DC | PRN
  Administered 2020-02-09: 50 ug/min via INTRAVENOUS

## 2020-02-09 MED ORDER — LIDOCAINE HCL 2 % IJ SOLN
INTRAMUSCULAR | Status: AC
  Filled 2020-02-09: qty 20

## 2020-02-09 MED ORDER — LIDOCAINE 2% (20 MG/ML) 5 ML SYRINGE
INTRAMUSCULAR | Status: DC | PRN
  Administered 2020-02-09: 100 mg via INTRAVENOUS

## 2020-02-09 MED ORDER — LIDOCAINE 2% (20 MG/ML) 5 ML SYRINGE
INTRAMUSCULAR | Status: AC
  Filled 2020-02-09: qty 5

## 2020-02-09 MED ORDER — LIP MEDEX EX OINT
TOPICAL_OINTMENT | CUTANEOUS | Status: AC
  Filled 2020-02-09: qty 7

## 2020-02-09 MED ORDER — HEPARIN SODIUM (PORCINE) 5000 UNIT/ML IJ SOLN: 5000.0000 [IU] | INTRAMUSCULAR | Status: AC

## 2020-02-09 MED ORDER — GABAPENTIN 300 MG PO CAPS: 300.0000 mg | ORAL_CAPSULE | ORAL | Status: AC

## 2020-02-09 MED ORDER — FENTANYL CITRATE (PF) 100 MCG/2ML IJ SOLN: 25.0000 ug | INTRAMUSCULAR | Status: DC | PRN

## 2020-02-09 MED ORDER — FENTANYL CITRATE (PF) 250 MCG/5ML IJ SOLN
INTRAMUSCULAR | Status: DC | PRN
Start: 2020-02-09 — End: 2020-02-09
  Administered 2020-02-09: 100 ug via INTRAVENOUS
  Administered 2020-02-09 (×3): 50 ug via INTRAVENOUS

## 2020-02-09 MED ORDER — CHLORHEXIDINE GLUCONATE 0.12 % MT SOLN
15.0000 mL | Freq: Once | OROMUCOSAL | Status: AC
  Administered 2020-02-09: 15 mL via OROMUCOSAL

## 2020-02-09 MED ORDER — SODIUM CHLORIDE (PF) 0.9 % IJ SOLN
INTRAMUSCULAR | Status: AC
  Filled 2020-02-09: qty 20

## 2020-02-09 MED ORDER — PROPOFOL 10 MG/ML IV BOLUS
INTRAVENOUS | Status: DC | PRN
  Administered 2020-02-09: 180 mg via INTRAVENOUS

## 2020-02-09 MED ORDER — SODIUM CHLORIDE (PF) 0.9 % IJ SOLN
INTRAMUSCULAR | Status: DC | PRN
  Administered 2020-02-09: 10 mL

## 2020-02-09 MED ORDER — ACETAMINOPHEN 500 MG PO TABS
1000.0000 mg | ORAL_TABLET | Freq: Three times a day (TID) | ORAL | Status: DC
  Administered 2020-02-10 – 2020-02-11 (×3): 1000 mg via ORAL
  Filled 2020-02-09 (×3): qty 2

## 2020-02-09 MED ORDER — DEXTROSE 50 % IV SOLN
INTRAVENOUS | Status: AC
  Filled 2020-02-09: qty 50

## 2020-02-09 MED ORDER — TISSEEL VH 10 ML EX KIT
PACK | CUTANEOUS | Status: AC
  Filled 2020-02-09: qty 2

## 2020-02-09 MED ORDER — DEXAMETHASONE SODIUM PHOSPHATE 10 MG/ML IJ SOLN
INTRAMUSCULAR | Status: AC
  Filled 2020-02-09: qty 1

## 2020-02-09 MED ORDER — DEXTROSE 50 % IV SOLN
INTRAVENOUS | Status: DC | PRN
  Administered 2020-02-09: 25 mL via INTRAVENOUS

## 2020-02-09 MED ORDER — LACTATED RINGERS IR SOLN
Status: DC | PRN
  Administered 2020-02-09: 1000 mL

## 2020-02-09 MED ORDER — BUPIVACAINE LIPOSOME 1.3 % IJ SUSP
20.0000 mL | Freq: Once | INTRAMUSCULAR | Status: AC
  Administered 2020-02-09: 20 mL
  Filled 2020-02-09: qty 20

## 2020-02-09 MED ORDER — FENTANYL CITRATE (PF) 100 MCG/2ML IJ SOLN
25.0000 ug | INTRAMUSCULAR | Status: DC | PRN
  Administered 2020-02-09: 50 ug via INTRAVENOUS

## 2020-02-09 MED ORDER — KETAMINE HCL 10 MG/ML IJ SOLN
INTRAMUSCULAR | Status: AC
  Filled 2020-02-09: qty 1

## 2020-02-09 MED ORDER — SUGAMMADEX SODIUM 500 MG/5ML IV SOLN
INTRAVENOUS | Status: AC
  Filled 2020-02-09: qty 5

## 2020-02-09 MED ORDER — AMISULPRIDE (ANTIEMETIC) 5 MG/2ML IV SOLN: 10.0000 mg | Freq: Once | INTRAVENOUS | Status: DC | PRN

## 2020-02-09 MED ORDER — ACETAMINOPHEN 500 MG PO TABS
ORAL_TABLET | ORAL | Status: AC
  Administered 2020-02-09: 1000 mg via ORAL
  Filled 2020-02-09: qty 2

## 2020-02-09 MED ORDER — ENSURE MAX PROTEIN PO LIQD
2.0000 [oz_av] | ORAL | Status: DC
  Administered 2020-02-10 – 2020-02-11 (×8): 2 [oz_av] via ORAL

## 2020-02-09 MED ORDER — FENTANYL CITRATE (PF) 100 MCG/2ML IJ SOLN
INTRAMUSCULAR | Status: AC
  Administered 2020-02-09: 50 ug via INTRAVENOUS
  Filled 2020-02-09: qty 2

## 2020-02-09 MED ORDER — ACETAMINOPHEN 160 MG/5ML PO SOLN: 325.0000 mg | Freq: Once | ORAL | Status: DC | PRN

## 2020-02-09 MED ORDER — PHENYLEPHRINE 40 MCG/ML (10ML) SYRINGE FOR IV PUSH (FOR BLOOD PRESSURE SUPPORT)
PREFILLED_SYRINGE | INTRAVENOUS | Status: AC
  Filled 2020-02-09: qty 10

## 2020-02-09 MED ORDER — MIDAZOLAM HCL 2 MG/2ML IJ SOLN
INTRAMUSCULAR | Status: AC
  Filled 2020-02-09: qty 2

## 2020-02-09 MED ORDER — ACETAMINOPHEN 325 MG PO TABS: 325.0000 mg | ORAL_TABLET | Freq: Once | ORAL | Status: DC | PRN

## 2020-02-09 MED ORDER — METOPROLOL TARTRATE 5 MG/5ML IV SOLN
5.0000 mg | Freq: Four times a day (QID) | INTRAVENOUS | Status: DC | PRN
  Filled 2020-02-09: qty 5

## 2020-02-09 MED ORDER — ACETAMINOPHEN 10 MG/ML IV SOLN: 1000.0000 mg | Freq: Once | INTRAVENOUS | Status: DC | PRN

## 2020-02-09 MED ORDER — DEXAMETHASONE SODIUM PHOSPHATE 10 MG/ML IJ SOLN
INTRAMUSCULAR | Status: DC | PRN
  Administered 2020-02-09: 5 mg via INTRAVENOUS

## 2020-02-09 MED ORDER — HEPARIN SODIUM (PORCINE) 5000 UNIT/ML IJ SOLN
INTRAMUSCULAR | Status: AC
  Administered 2020-02-09: 5000 [IU] via SUBCUTANEOUS
  Filled 2020-02-09: qty 1

## 2020-02-09 MED ORDER — ROCURONIUM BROMIDE 10 MG/ML (PF) SYRINGE
PREFILLED_SYRINGE | INTRAVENOUS | Status: DC | PRN
  Administered 2020-02-09: 20 mg via INTRAVENOUS
  Administered 2020-02-09: 10 mg via INTRAVENOUS
  Administered 2020-02-09: 80 mg via INTRAVENOUS

## 2020-02-09 MED ORDER — ACETAMINOPHEN 500 MG PO TABS: 1000.0000 mg | ORAL_TABLET | ORAL | Status: AC

## 2020-02-09 MED ORDER — MORPHINE SULFATE (PF) 2 MG/ML IV SOLN
1.0000 mg | INTRAVENOUS | Status: DC | PRN
  Administered 2020-02-10: 2 mg via INTRAVENOUS
  Filled 2020-02-09 (×2): qty 1

## 2020-02-09 SURGICAL SUPPLY — 67 items
APPLICATOR COTTON TIP 6 STRL (MISCELLANEOUS) ×2 IMPLANT
APPLICATOR COTTON TIP 6IN STRL (MISCELLANEOUS) ×6
APPLIER CLIP ROT 10 11.4 M/L (STAPLE)
APPLIER CLIP ROT 13.4 12 LRG (CLIP) ×3
BLADE SURG 15 STRL LF DISP TIS (BLADE) ×1 IMPLANT
BLADE SURG 15 STRL SS (BLADE) ×2
CABLE HIGH FREQUENCY MONO STRZ (ELECTRODE) ×3 IMPLANT
CLIP APPLIE ROT 10 11.4 M/L (STAPLE) IMPLANT
CLIP APPLIE ROT 13.4 12 LRG (CLIP) ×1 IMPLANT
CLIP SUT LAPRA TY ABSORB (SUTURE) ×3 IMPLANT
COVER WAND RF STERILE (DRAPES) IMPLANT
DEVICE SUT QUICK LOAD TK 5 (STAPLE) IMPLANT
DEVICE SUT TI-KNOT TK 5X26 (MISCELLANEOUS) IMPLANT
DEVICE SUTURE ENDOST 10MM (ENDOMECHANICALS) ×3 IMPLANT
DEVICE TI KNOT TK5 (MISCELLANEOUS)
DISSECTOR BLUNT TIP ENDO 5MM (MISCELLANEOUS) IMPLANT
DRAIN PENROSE 0.25X18 (DRAIN) ×3 IMPLANT
GAUZE 4X4 16PLY RFD (DISPOSABLE) ×3 IMPLANT
GLOVE BIOGEL M 8.0 STRL (GLOVE) ×3 IMPLANT
GOWN STRL REUS W/TWL XL LVL3 (GOWN DISPOSABLE) ×6 IMPLANT
HANDLE STAPLE EGIA 4 XL (STAPLE) ×3 IMPLANT
KIT BASIN OR (CUSTOM PROCEDURE TRAY) ×3 IMPLANT
KIT GASTRIC LAVAGE 34FR ADT (SET/KITS/TRAYS/PACK) ×3 IMPLANT
KIT TURNOVER KIT A (KITS) ×3 IMPLANT
MARKER SKIN DUAL TIP RULER LAB (MISCELLANEOUS) ×3 IMPLANT
MAT PREVALON FULL STRYKER (MISCELLANEOUS) ×3 IMPLANT
NEEDLE SPNL 22GX3.5 QUINCKE BK (NEEDLE) ×3 IMPLANT
PACK CARDIOVASCULAR III (CUSTOM PROCEDURE TRAY) ×3 IMPLANT
PENCIL SMOKE EVACUATOR (MISCELLANEOUS) IMPLANT
QUICK LOAD TK 5 (STAPLE)
RELOAD EGIA 45 MED/THCK PURPLE (STAPLE) IMPLANT
RELOAD EGIA 45 TAN VASC (STAPLE) IMPLANT
RELOAD EGIA 60 MED/THCK PURPLE (STAPLE) ×12 IMPLANT
RELOAD EGIA 60 TAN VASC (STAPLE) ×6 IMPLANT
RELOAD ENDO STITCH 2.0 (ENDOMECHANICALS) ×18
RELOAD TRI 45 ART MED THCK PUR (STAPLE) IMPLANT
RELOAD TRI 60 ART MED THCK PUR (STAPLE) IMPLANT
SCISSORS LAP 5X45 EPIX DISP (ENDOMECHANICALS) ×3 IMPLANT
SEALANT SURGICAL APPL DUAL CAN (MISCELLANEOUS) ×3 IMPLANT
SET IRRIG TUBING LAPAROSCOPIC (IRRIGATION / IRRIGATOR) ×3 IMPLANT
SET TUBE SMOKE EVAC HIGH FLOW (TUBING) ×3 IMPLANT
SHEARS HARMONIC ACE PLUS 45CM (MISCELLANEOUS) ×3 IMPLANT
SLEEVE ADV FIXATION 12X100MM (TROCAR) ×9 IMPLANT
SLEEVE ADV FIXATION 5X100MM (TROCAR) IMPLANT
SOL ANTI FOG 6CC (MISCELLANEOUS) ×1 IMPLANT
SOLUTION ANTI FOG 6CC (MISCELLANEOUS) ×2
STAPLER VISISTAT 35W (STAPLE) IMPLANT
SUT MNCRL AB 4-0 PS2 18 (SUTURE) ×6 IMPLANT
SUT RELOAD ENDO STITCH 2 48X1 (ENDOMECHANICALS) ×5
SUT RELOAD ENDO STITCH 2.0 (ENDOMECHANICALS) ×4
SUT SURGIDAC NAB ES-9 0 48 120 (SUTURE) IMPLANT
SUT VIC AB 2-0 SH 27 (SUTURE) ×2
SUT VIC AB 2-0 SH 27X BRD (SUTURE) ×1 IMPLANT
SUTURE RELOAD END STTCH 2 48X1 (ENDOMECHANICALS) ×5 IMPLANT
SUTURE RELOAD ENDO STITCH 2.0 (ENDOMECHANICALS) ×4 IMPLANT
SYR 10ML ECCENTRIC (SYRINGE) ×3 IMPLANT
SYR 20ML LL LF (SYRINGE) ×6 IMPLANT
SYR 50ML LL SCALE MARK (SYRINGE) ×3 IMPLANT
TOWEL OR 17X26 10 PK STRL BLUE (TOWEL DISPOSABLE) ×3 IMPLANT
TOWEL OR NON WOVEN STRL DISP B (DISPOSABLE) ×3 IMPLANT
TRAY FOLEY MTR SLVR 16FR STAT (SET/KITS/TRAYS/PACK) ×3 IMPLANT
TROCAR ADV FIXATION 12X100MM (TROCAR) ×3 IMPLANT
TROCAR ADV FIXATION 5X100MM (TROCAR) ×3 IMPLANT
TROCAR BLADELESS OPT 5 100 (ENDOMECHANICALS) ×3 IMPLANT
TROCAR XCEL 12X100 BLDLESS (ENDOMECHANICALS) ×3 IMPLANT
TUBING CONNECTING 10 (TUBING) ×4 IMPLANT
TUBING CONNECTING 10' (TUBING) ×2

## 2020-02-09 NOTE — Progress Notes (Signed)
PHARMACY CONSULT FOR:  Risk Assessment for Post-Discharge VTE Following Bariatric Surgery  Post-Discharge VTE Risk Assessment: This patient's probability of 30-day post-discharge VTE is increased due to the factors marked:   Female  X  Age >/=60 years    BMI >/=50 kg/m2    CHF    Dyspnea at Rest    Paraplegia  X  Non-gastric-band surgery    Operation Time >/=3 hr    Return to OR     Length of Stay >/= 3 d   Hx of VTE   Hypercoagulable condition   Significant venous stasis       Predicted probability of 30-day post-discharge VTE: 0.31%  Other patient-specific factors to consider: N/A  Recommendation for Discharge: No pharmacologic prophylaxis post-discharge  Melinda [name] is a 64 y.o. female who underwent laparoscopic Roux-en-Y gastric bypass 02/09/2020   No Known Allergies  Patient Measurements:   There is no height or weight on file to calculate BMI.  No results for input(s): WBC, HGB, HCT, PLT, APTT, CREATININE, LABCREA, CREATININE, CREAT24HRUR, MG, PHOS, ALBUMIN, PROT, ALBUMIN, AST, ALT, ALKPHOS, BILITOT, BILIDIR, IBILI in the last 72 hours. Estimated Creatinine Clearance: 63.5 mL/min (A) (by C-G formula based on SCr of 1.27 mg/dL (H)).    Past Medical History:  Diagnosis Date  . Anxiety   . Coronary artery disease    a. 06/2014 NSTEMI s/p CABG x 4 (LIMA to LAD, VG to Diag, VG to OM, VG to PDA).  . Depression   . Diabetic neuropathy ([place])   . GERD (gastroesophageal reflux disease)   . HTN (hypertension)   . Hyperlipidemia LDL goal <70   . IDDM (insulin dependent diabetes mellitus)   . Ischemic cardiomyopathy    a. 06/2014 Echo: EF 40-45%, HK of entire inferolateral and inferior myocardium c/w infarct of RCA/LCx, GR2DD, mild MR  . Myocardial infarction ([place]) 2016  . OA (osteoarthritis) of knee   . Obesity   . Osteoarthritis   . Osteomyelitis ([place])   . Peripheral neuropathy   . Peripheral vascular disease ([place])    a. 09/2016 Periph Angio: CTO R  popliteal, CTO L prox/mid SFA->Med Rx; b. 05/2017 PTA: LSFA (Viabahn covered stent x 2), DEB to L Post Tibial; c. 06/2017 ABI: R 0.44, L 1.05.  . Tobacco abuse      Medications Prior to Admission  Medication Sig Dispense Refill Last Dose  . amLODipine (NORVASC) 5 MG tablet Take 5 mg by mouth daily.      Marland Kitchen aspirin 81 MG EC tablet Take 1 tablet (81 mg total) by mouth daily. 30 tablet 0   . atorvastatin (LIPITOR) 80 MG tablet TAKE 1 TABLET EVERY DAY  AT  6  PM (Patient taking differently: Take 80 mg by mouth daily at 6 PM. ) 90 tablet 2   . clopidogrel (PLAVIX) 75 MG tablet Take 1 tablet (75 mg total) by mouth daily. 90 tablet 3   . DULoxetine (CYMBALTA) 60 MG capsule Take 60 mg by mouth daily.      . ferrous sulfate 325 (65 FE) MG tablet Take 325 mg by mouth daily.     . furosemide (LASIX) 20 MG tablet TAKE 1 TABLET (20 MG TOTAL) BY MOUTH DAILY AS NEEDED. (Patient taking differently: Take 20 mg by mouth daily as needed (fluid retention.). ) 90 tablet 2   . LANTUS SOLOSTAR 100 UNIT/ML Solostar Pen Inject 70 Units into the skin in the morning and at bedtime.      Marland Kitchen lisinopril (  PRINIVIL,ZESTRIL) 5 MG tablet Take 1 tablet (5 mg total) by mouth daily. 30 tablet 0   . metoprolol tartrate (LOPRESSOR) 25 MG tablet TAKE 1/2 TABLET TWICE DAILY  (  BETA  BLOCKER  ) (Patient taking differently: Take 12.5 mg by mouth in the morning and at bedtime. ) 90 tablet 1   . NOVOLOG FLEXPEN 100 UNIT/ML FlexPen Inject 6 Units into the skin 3 (three) times daily before meals. *HOLD FOR LOW BLOOD GLUCOSE READING*     . oxybutynin (DITROPAN) 5 MG tablet Take 5 mg by mouth 2 (two) times daily.      . potassium chloride SA (KLOR-CON) 20 MEQ tablet TAKE 1 TABLET (20 MEQ TOTAL) BY MOUTH DAILY AS NEEDED. (Patient taking differently: Take 20 mEq by mouth daily as needed (with lasix (fluid retention)). ) 90 tablet 3   . pregabalin (LYRICA) 100 MG capsule Take 100 mg by mouth 3 (three) times daily.      Marland Kitchen tiZANidine (ZANAFLEX) 4 MG  tablet Take 4 mg by mouth 3 (three) times daily.     Marland Kitchen VICTOZA 18 MG/3ML SOPN Inject 1.2 mg into the skin daily.      Marland Kitchen ACCU-CHEK AVIVA PLUS test strip 1 each 3 (three) times daily.     . Accu-Chek Softclix Lancets lancets 1 each 3 (three) times daily.     Marland Kitchen atorvastatin (LIPITOR) 80 MG tablet Take 80 mg by mouth daily.     . clobetasol (TEMOVATE) 0.05 % external solution Patient to mix entire bottle with 1 jar of CeraVe and apply twice a day x 1 month then as needed. Avoid face, groin, underarms. (Patient taking differently: Apply 1 application topically 2 (two) times daily. Patient to mix entire bottle with 1 jar of CeraVe and apply twice a day x 1 month then as needed. Avoid face, groin, underarms.) 50 mL 0   . clobetasol (TEMOVATE) 0.05 % external solution Apply 1 application topically 2 (two) times daily as needed.     . DROPLET PEN NEEDLES 31G X 6 MM MISC      . Eflornithine HCl 13.9 % cream Apply to upper lip, chin and neck twice a day. (Patient taking differently: Apply 1 application topically in the morning and at bedtime. Apply to upper lip, chin and neck twice a day.) 45 g 2   . Eflornithine HCl 13.9 % cream Apply topically 2 (two) times daily with a meal.     . Emollient (CERAVE EX) Apply 1 application topically in the morning and at bedtime. Mixed with clobetasol     . furosemide (LASIX) 20 MG tablet Take 20 mg by mouth daily as needed.     . lidocaine (LIDODERM) 5 % Place 1 patch onto the skin daily as needed (PAIN.).      Marland Kitchen metoprolol tartrate (LOPRESSOR) 25 MG tablet Take 12.5 mg by mouth 2 (two) times daily.     . potassium chloride SA (KLOR-CON) 20 MEQ tablet Take 20 mEq by mouth daily as needed.         Eudelia Bunch, Pharm.D 02/09/2020 2:20 PM

## 2020-02-09 NOTE — Op Note (Signed)
Preoperative diagnosis: Roux-en-Y gastric bypass  Postoperative diagnosis: Same   Procedure: Upper endoscopy   Surgeon: Clovis Riley, M.D.  Anesthesia: Gen.   Description of procedure: The endoscope was placed in the mouth and oropharynx and under endoscopic vision it was advanced to the esophagogastric junction which was identified at 42cm from the teeth.  The pouch was tensely insufflated while the upper abdomen was flooded with irrigation and the roux limb gently clamped to perform a leak test, which was negative. No bubbles were seen.  The anastomosis was widely patent and identified at 48cm from the teeth, and the lumen was hemostatic. The lumen was decompressed and the scope was withdrawn without difficulty.    Clovis Riley, M.D. General, Bariatric, & Minimally Invasive Surgery [facility]

## 2020-02-09 NOTE — Plan of Care (Signed)
Pt arrived to room and walked to chair, complains of pain in her shoulder. Patient successfully reached 1000 on incentive spirometer and started eating ice at 1615.

## 2020-02-09 NOTE — Op Note (Signed)
09 February 2020 Surgeon: Catalina Antigua B. Hassell Done, MD, FACS Asst:  Jens Som, MD FACS Anesthesia: General endotracheal Drains: None  Procedure: Laparoscopic Roux en Y gastric bypass with 40 cm BP limb and 100 cm Roux limb, antecolic, antegastric, candy cane to the left.  Closure of Peterson's defect. Upper endoscopy.   Description of Procedure:  The patient was taken to OR 1 at [facility] and given general anesthesia.  The abdomen was prepped with PCMX and draped sterilely.  A time out was performed.    The operation began by identifying the ligament of Treitz. I measured 40 cm downstream and divided the bowel with a 6 cm Covidian stapler.  I sutured a Penrose drain along the Roux limb end.  I measured a 1 meter (100 cm) Roux limb and then placed the distal bowels to the BP limb side by side and performed a stapled jejunojejunostomy. The common defect was closed from either end with 4-0 Vicryl using the Endo Stitch. The mesenteric defect was closed with a running 2-0 silk using the Endo Stitch. Tisseel was applied to the suture line.  The omentum was divided with the harmonic scalpel.  The Nathanson retractor was inserted in the left lateral segment of liver was retracted. The foregut dissection ensued.  Approximately 6 cm along the lesser curvature we opened that area and got into the retrogastric space.  4 applications of the 6 cm stapler first using just the staple line in the last 2 with TRS completely divided the stomach.  No hiatal no hiatal hernia was noted.  The Roux limb was then brought up with the candycane pointed left and a back row of sutures of 2-0 Vicryl were placed. I opened along the right side of each structure and inserted the 4.5 cm stapler to create the gastrojejunostomy. The common defect was closed from either end with 2-0 Vicryl and a second row was placed anterior to that the Ewald tube acting as a stent across the anastomosis. The Penrose drain was removed. Peterson's defect was closed  with 2-0 silk.   Endoscopy was performed by Dr. Windle Guard and this showed a patent anastomosis and gas went down below the jejunojejunostomy.  The incisions were injected with Exparel applied as a tap block at the first of the case and were closed with 4-0 Monocryl and Dermabond  The patient was taken to the recovery room in satisfactory condition.  Matt B. Hassell Done, MD, FACS

## 2020-02-09 NOTE — Discharge Instructions (Signed)
   GASTRIC BYPASS/SLEEVE  Home Care Instructions   These instructions are to help you care for yourself when you go home.  Call: If you have any problems. . Call [phone] and ask for the surgeon on call . If you need immediate help, come to the ER at [facility].  . Tell the ER staff that you are a new post-op gastric bypass or gastric sleeve patient   Signs and symptoms to report: . Severe vomiting or nausea o If you cannot keep down clear liquids for longer than 1 day, call your surgeon  . Abdominal pain that does not get better after taking your pain medication . Fever over 100.4 F with chills . Heart beating over 100 beats a minute . Shortness of breath at rest . Chest pain .  Redness, swelling, drainage, or foul odor at incision (surgical) sites .  If your incisions open or pull apart . Swelling or pain in calf (lower leg) . Diarrhea (Loose bowel movements that happen often), frequent watery, uncontrolled bowel movements . Constipation, (no bowel movements for 3 days) if this happens: Pick one o Milk of Magnesia, 2 tablespoons by mouth, 3 times a day for 2 days if needed o Stop taking Milk of Magnesia once you have a bowel movement o Call your doctor if constipation continues Or o Miralax  (instead of Milk of Magnesia) following the label instructions o Stop taking Miralax once you have a bowel movement o Call your doctor if constipation continues . Anything you think is not normal   Normal side effects after surgery: . Unable to sleep at night or unable to focus . Irritability or moody . Being tearful (crying) or depressed These are common complaints, possibly related to your anesthesia medications that put you to sleep, stress of surgery, and change in lifestyle.  This usually goes away a few weeks after surgery.  If these feelings continue, call your primary care doctor.   Wound Care: You may have surgical glue, steri-strips, or staples over your incisions after  surgery . Surgical glue:  Looks like a clear film over your incisions and will wear off a little at a time . Steri-strips: Strips of tape over your incisions. You may notice a yellowish color on the skin under the steri-strips. This is used to make the   steri-strips stick better. Do not pull the steri-strips off - let them fall off . Staples: Staples may be removed before you leave the hospital o If you go home with staples, call [facility], [phone] at for an appointment with your surgeon's nurse to have staples removed 10 days after surgery. . Showering: You may shower two (2) days after your surgery unless your surgeon tells you differently o Wash gently around incisions with warm soapy water, rinse well, and gently pat dry  o No tub baths until staples are removed, steri-strips fall off or glue is gone.    Medications: . Medications should be liquid or crushed if larger than the size of a dime . Extended release pills (medication that release a little bit at a time through the day) should NOT be crushed or cut. (examples include XL, ER, DR, SR) . Depending on the size and number of medications you take, you may need to space (take a few throughout the day)/change the time you take your medications so that you do not over-fill your pouch (smaller stomach) . Make sure you follow-up with your primary care doctor to   make medication changes needed during rapid weight loss and life-style changes . If you have diabetes, follow up with the doctor that orders your diabetes medication(s) within one week after surgery and check your blood sugar regularly. . Do not drive while taking prescription pain medication  . It is ok to take Tylenol by the bottle instructions with your pain medicine or instead of your pain medicine as needed.  DO NOT TAKE NSAIDS (EXAMPLES OF NSAIDS:  IBUPROFREN/ NAPROXEN)  Diet:                    First 2 Weeks  You will see the dietician t about two (2) weeks  after your surgery. The dietician will increase the types of foods you can eat if you are handling liquids well: . If you have severe vomiting or nausea and cannot keep down clear liquids lasting longer than 1 day, call your surgeon @ ([phone]) Protein Shake . Drink at least 2 ounces of shake 5-6 times per day . Each serving of protein shakes (usually 8 - 12 ounces) should have: o 15 grams of protein  o And no more than 5 grams of carbohydrate  . Goal for protein each day: o Men = 80 grams per day o Women = 60 grams per day . Protein powder may be added to fluids such as non-fat milk or Lactaid milk or unsweetened Soy/Almond milk (limit to 35 grams added protein powder per serving)  Hydration . Slowly increase the amount of water and other clear liquids as tolerated (See Acceptable Fluids) . Slowly increase the amount of protein shake as tolerated  .  Sip fluids slowly and throughout the day.  Do not use straws. . May use sugar substitutes in small amounts (no more than 6 - 8 packets per day; i.e. Splenda)  Fluid Goal . The first goal is to drink at least 8 ounces of protein shake/drink per day (or as directed by the nutritionist); some examples of protein shakes are Syntrax Nectar, Adkins Advantage, EAS Edge HP, and Unjury. See handout from pre-op Bariatric Education Class: o Slowly increase the amount of protein shake you drink as tolerated o You may find it easier to slowly sip shakes throughout the day o It is important to get your proteins in first . Your fluid goal is to drink 64 - 100 ounces of fluid daily o It may take a few weeks to build up to this . 32 oz (or more) should be clear liquids  And  . 32 oz (or more) should be full liquids (see below for examples) . Liquids should not contain sugar, caffeine, or carbonation  Clear Liquids: . Water or Sugar-free flavored water (i.e. Fruit H2O, Propel) . Decaffeinated coffee or tea (sugar-free) . Crystal Lite, Wyler's Lite,  Minute Maid Lite . Sugar-free Jell-O . Bouillon or broth . Sugar-free Popsicle:   *Less than 20 calories each; Limit 1 per day  Full Liquids: Protein Shakes/Drinks + 2 choices per day of other full liquids . Full liquids must be: o No More Than 15 grams of Carbs per serving  o No More Than 3 grams of Fat per serving . Strained low-fat cream soup (except Cream of Potato or Tomato) . Non-Fat milk . Fat-free Lactaid Milk . Unsweetened Soy Or Unsweetened Almond Milk . Low Sugar yogurt (Dannon Lite & Fit, Greek yogurt; Oikos Triple Zero; Chobani Simply 100; Yoplait 100 calorie Greek - No Fruit on the Bottom)    Vitamins   and Minerals . Start 1 day after surgery unless otherwise directed by your surgeon . Chewable Bariatric Specific Multivitamin / Multimineral Supplement with iron (Example: Bariatric Advantage Multi EA) . Chewable Calcium with Vitamin D-3 (Example: 3 Chewable Calcium Plus 600 with Vitamin D-3) o Take 500 mg three (3) times a day for a total of 1500 mg each day o Do not take all 3 doses of calcium at one time as it may cause constipation, and you can only absorb 500 mg  at a time  o Do not mix multivitamins containing iron with calcium supplements; take 2 hours apart . Menstruating women and those with a history of anemia (a blood disease that causes weakness) may need extra iron o Talk with your doctor to see if you need more iron . Do not stop taking or change any vitamins or minerals until you talk to your dietitian or surgeon . Your Dietitian and/or surgeon must approve all vitamin and mineral supplements   Activity and Exercise: Limit your physical activity as instructed by your doctor.  It is important to continue walking at home.  During this time, use these guidelines: . Do not lift anything greater than ten (10) pounds for at least two (2) weeks . Do not go back to work or drive until your surgeon says you can . You may have sex when you feel comfortable  o It is  VERY important for female patients to use a reliable birth control method; fertility often increases after surgery  o All hormonal birth control will be ineffective for 30 days after surgery due to medications given during surgery a barrier method must be used. o Do not get pregnant for at least 18 months . Start exercising as soon as your doctor tells you that you can o Make sure your doctor approves any physical activity . Start with a simple walking program . Walk 5-15 minutes each day, 7 days per week.  . Slowly increase until you are walking 30-45 minutes per day Consider joining our BELT program. [phone] or email [email or web address]   Special Instructions Things to remember: . Use your CPAP when sleeping if this applies to you  . [facility] has two free Bariatric Surgery Support Groups that meet monthly o The 3rd Thursday of each month, 6 pm o The 2nd Friday of each month, 11:30 . It is very important to keep all follow up appointments with your surgeon, dietitian, primary care physician, and behavioral health practitioner . Routine follow up schedule with your surgeon include appointments at 2-3 weeks, 6-8 weeks, 6 months, and 1 year at a minimum.  Your surgeon may request to see you more often. . After the first year, please follow up with your bariatric surgeon and dietitian at least once a year in order to maintain best weight loss results   [facility]: [phone] [facility]: [phone] Bariatric Nurse Coordinator: [phone]      Reviewed and Endorsed  by [facility] Patient Education Committee, June, 2016 Edits Approved: Aug, 2018    

## 2020-02-09 NOTE — Progress Notes (Signed)
Discussed post op day goals with patient including ambulation, IS, diet progression, pain, and nausea control.  BSTOP education provided including BSTOP information guide, "Guide for Pain Management after your Bariatric Procedure".  Questions answered. 

## 2020-02-09 NOTE — Anesthesia Procedure Notes (Addendum)
Procedure Name: Intubation Date/Time: 02/09/2020 10:08 AM Performed by: Lollie Sails, CRNA Pre-anesthesia Checklist: Patient identified Patient Re-evaluated:Patient Re-evaluated prior to induction Oxygen Delivery Method: Circle system utilized Preoxygenation: Pre-oxygenation with 100% oxygen Induction Type: IV induction Ventilation: Oral airway inserted - appropriate to patient size Laryngoscope Size: Miller and 3 Grade View: Grade I Tube type: Oral Tube size: 7.5 mm Number of attempts: 1 Airway Equipment and Method: Stylet Placement Confirmation: ETT inserted through vocal cords under direct vision,  positive ETCO2 and breath sounds checked- equal and bilateral Secured at: 23 cm Tube secured with: Tape Dental Injury: Teeth and Oropharynx as per pre-operative assessment  Comments: Performed by Carleene Cooper SRNA

## 2020-02-09 NOTE — Interval H&P Note (Signed)
History and Physical Interval Note:  02/09/2020 9:55 AM  Melinda [name]  has presented today for surgery, with the diagnosis of morbid obesity.  The various methods of treatment have been discussed with the patient and family. After consideration of risks, benefits and other options for treatment, the patient has consented to  Procedure(s): LAPAROSCOPIC ROUX-EN-Y GASTRIC BYPASS WITH UPPER ENDOSCOPY (N/A) UPPER GI ENDOSCOPY (N/A) as a surgical intervention.  The patient's history has been reviewed, patient examined, no change in status, stable for surgery.  I have reviewed the patient's chart and labs.  Questions were answered to the patient's satisfaction.     Pedro Earls

## 2020-02-09 NOTE — Progress Notes (Signed)
PHARMACY CONSULT FOR:  Risk Assessment for Post-Discharge VTE Following Bariatric Surgery  Post-Discharge VTE Risk Assessment: This patient's probability of 30-day post-discharge VTE is increased due to the factors marked:   Female  X  Age >/=60 years    BMI >/=50 kg/m2    CHF    Dyspnea at Rest    Paraplegia   X Non-gastric-band surgery    Operation Time >/=3 hr    Return to OR     Length of Stay >/= 3 d   Hx of VTE   Hypercoagulable condition   Significant venous stasis     Predicted probability of 30-day post-discharge VTE: 0.31%  Other patient-specific factors to consider: N/A   Recommendation for Discharge: No pharmacologic prophylaxis post-discharge  Melinda [name] is a 64 y.o. female who underwent laparoscopic Roux-en-Y gastric bypass 02/09/2020   No Known Allergies  Patient Measurements: Height: 5' 8.5" (174 cm) Weight: 133.1 kg (293 lb 6.4 oz) IBW/kg (Calculated) : 65.05 Body mass index is 43.96 kg/m.  No results for input(s): WBC, HGB, HCT, PLT, APTT, CREATININE, LABCREA, CREATININE, CREAT24HRUR, MG, PHOS, ALBUMIN, PROT, ALBUMIN, AST, ALT, ALKPHOS, BILITOT, BILIDIR, IBILI in the last 72 hours. Estimated Creatinine Clearance: 63.5 mL/min (A) (by C-G formula based on SCr of 1.27 mg/dL (H)).    Past Medical History:  Diagnosis Date  . Anxiety   . Coronary artery disease    a. 06/2014 NSTEMI s/p CABG x 4 (LIMA to LAD, VG to Diag, VG to OM, VG to PDA).  . Depression   . Diabetic neuropathy ([place])   . GERD (gastroesophageal reflux disease)   . HTN (hypertension)   . Hyperlipidemia LDL goal <70   . IDDM (insulin dependent diabetes mellitus)   . Ischemic cardiomyopathy    a. 06/2014 Echo: EF 40-45%, HK of entire inferolateral and inferior myocardium c/w infarct of RCA/LCx, GR2DD, mild MR  . Myocardial infarction ([place]) 2016  . OA (osteoarthritis) of knee   . Obesity   . Osteoarthritis   . Osteomyelitis ([place])   . Peripheral neuropathy   . Peripheral  vascular disease ([place])    a. 09/2016 Periph Angio: CTO R popliteal, CTO L prox/mid SFA->Med Rx; b. 05/2017 PTA: LSFA (Viabahn covered stent x 2), DEB to L Post Tibial; c. 06/2017 ABI: R 0.44, L 1.05.  . Tobacco abuse      Medications Prior to Admission  Medication Sig Dispense Refill Last Dose  . amLODipine (NORVASC) 5 MG tablet Take 5 mg by mouth daily.    02/09/2020 at 0600  . aspirin 81 MG EC tablet Take 1 tablet (81 mg total) by mouth daily. 30 tablet 0 02/07/2020  . atorvastatin (LIPITOR) 80 MG tablet TAKE 1 TABLET EVERY DAY  AT  6  PM (Patient taking differently: Take 80 mg by mouth daily at 6 PM. ) 90 tablet 2 02/08/2020 at Unknown time  . clopidogrel (PLAVIX) 75 MG tablet Take 1 tablet (75 mg total) by mouth daily. 90 tablet 3 02/07/2020  . DULoxetine (CYMBALTA) 60 MG capsule Take 60 mg by mouth daily.    02/09/2020 at 0600  . ferrous sulfate 325 (65 FE) MG tablet Take 325 mg by mouth daily.   02/08/2020 at Unknown time  . furosemide (LASIX) 20 MG tablet TAKE 1 TABLET (20 MG TOTAL) BY MOUTH DAILY AS NEEDED. (Patient taking differently: Take 20 mg by mouth daily as needed (fluid retention.). ) 90 tablet 2 Past Month at Unknown time  . LANTUS SOLOSTAR 100  UNIT/ML Solostar Pen Inject 70 Units into the skin in the morning and at bedtime.    02/09/2020 at 0600  . lisinopril (PRINIVIL,ZESTRIL) 5 MG tablet Take 1 tablet (5 mg total) by mouth daily. 30 tablet 0 02/08/2020 at Unknown time  . metoprolol tartrate (LOPRESSOR) 25 MG tablet TAKE 1/2 TABLET TWICE DAILY  (  BETA  BLOCKER  ) (Patient taking differently: Take 12.5 mg by mouth in the morning and at bedtime. ) 90 tablet 1 02/09/2020 at 0600  . NOVOLOG FLEXPEN 100 UNIT/ML FlexPen Inject 6 Units into the skin 3 (three) times daily before meals. *HOLD FOR LOW BLOOD GLUCOSE READING*   02/07/2020  . oxybutynin (DITROPAN) 5 MG tablet Take 5 mg by mouth 2 (two) times daily.    02/09/2020 at 0600  . potassium chloride SA (KLOR-CON) 20 MEQ tablet TAKE 1  TABLET (20 MEQ TOTAL) BY MOUTH DAILY AS NEEDED. (Patient taking differently: Take 20 mEq by mouth daily as needed (with lasix (fluid retention)). ) 90 tablet 3 Past Month at Unknown time  . pregabalin (LYRICA) 100 MG capsule Take 100 mg by mouth 3 (three) times daily.    02/09/2020 at 0600  . tiZANidine (ZANAFLEX) 4 MG tablet Take 4 mg by mouth 3 (three) times daily.   02/08/2020 at Unknown time  . VICTOZA 18 MG/3ML SOPN Inject 1.2 mg into the skin daily.    02/07/2020  . ACCU-CHEK AVIVA PLUS test strip 1 each 3 (three) times daily.   supply  . Accu-Chek Softclix Lancets lancets 1 each 3 (three) times daily.   supply  . atorvastatin (LIPITOR) 80 MG tablet Take 80 mg by mouth daily.   duplicate  . clobetasol (TEMOVATE) 0.05 % external solution Patient to mix entire bottle with 1 jar of CeraVe and apply twice a day x 1 month then as needed. Avoid face, groin, underarms. (Patient taking differently: Apply 1 application topically 2 (two) times daily. Patient to mix entire bottle with 1 jar of CeraVe and apply twice a day x 1 month then as needed. Avoid face, groin, underarms.) 50 mL 0 Unknown at Unknown time  . clobetasol (TEMOVATE) 0.05 % external solution Apply 1 application topically 2 (two) times daily as needed.   Unknown at Unknown time  . DROPLET PEN NEEDLES 31G X 6 MM MISC    supply  . Eflornithine HCl 13.9 % cream Apply to upper lip, chin and neck twice a day. (Patient taking differently: Apply 1 application topically in the morning and at bedtime. Apply to upper lip, chin and neck twice a day.) 45 g 2 Unknown at Unknown time  . Eflornithine HCl 13.9 % cream Apply topically 2 (two) times daily with a meal.   Unknown at Unknown time  . Emollient (CERAVE EX) Apply 1 application topically in the morning and at bedtime. Mixed with clobetasol   Unknown at Unknown time  . furosemide (LASIX) 20 MG tablet Take 20 mg by mouth daily as needed.   duplicate  . lidocaine (LIDODERM) 5 % Place 1 patch onto the  skin daily as needed (PAIN.).    Unknown at Unknown time  . metoprolol tartrate (LOPRESSOR) 25 MG tablet Take 12.5 mg by mouth 2 (two) times daily.   duplicate  . potassium chloride SA (KLOR-CON) 20 MEQ tablet Take 20 mEq by mouth daily as needed.   duplicate     Eudelia Bunch, Pharm.D 02/09/2020 2:24 PM

## 2020-02-09 NOTE — Transfer of Care (Signed)
Immediate Anesthesia Transfer of Care Note  Patient: Melinda [name]  Procedure(s) Performed: LAPAROSCOPIC ROUX-EN-Y GASTRIC BYPASS WITH UPPER ENDOSCOPY (N/A ) UPPER GI ENDOSCOPY (N/A )  Patient Location: PACU  Anesthesia Type:General  Level of Consciousness: awake, alert  and patient cooperative  Airway & Oxygen Therapy: Patient Spontanous Breathing and Patient connected to face mask oxygen  Post-op Assessment: Report given to RN and Post -op Vital signs reviewed and stable  Post vital signs: Reviewed and stable  Last Vitals:  Vitals Value Taken Time  BP 122/81 02/09/20 1319  Temp    Pulse 72 02/09/20 1322  Resp 13 02/09/20 1322  SpO2 99 % 02/09/20 1322  Vitals shown include unvalidated device data.  Last Pain:  Vitals:   02/09/20 0855  TempSrc:   PainSc: 5       Patients Stated Pain Goal: 4 (45/14/60 4799)  Complications: No complications documented.

## 2020-02-10 ENCOUNTER — Encounter (HOSPITAL_COMMUNITY): Payer: Self-pay | Admitting: Surgery

## 2020-02-10 LAB — CBC WITH DIFFERENTIAL/PLATELET
Abs Immature Granulocytes: 0.05 10*3/uL (ref 0.00–0.07)
Basophils Absolute: 0 10*3/uL (ref 0.0–0.1)
Basophils Relative: 0 %
Eosinophils Absolute: 0 10*3/uL (ref 0.0–0.5)
Eosinophils Relative: 0 %
HCT: 35.3 % — ABNORMAL LOW (ref 36.0–46.0)
Hemoglobin: 10.6 g/dL — ABNORMAL LOW (ref 12.0–15.0)
Immature Granulocytes: 0 %
Lymphocytes Relative: 9 %
Lymphs Abs: 1.3 10*3/uL (ref 0.7–4.0)
MCH: 21.2 pg — ABNORMAL LOW (ref 26.0–34.0)
MCHC: 30 g/dL (ref 30.0–36.0)
MCV: 70.5 fL — ABNORMAL LOW (ref 80.0–100.0)
Monocytes Absolute: 1.1 10*3/uL — ABNORMAL HIGH (ref 0.1–1.0)
Monocytes Relative: 7 %
Neutro Abs: 11.8 10*3/uL — ABNORMAL HIGH (ref 1.7–7.7)
Neutrophils Relative %: 84 %
Platelets: 354 10*3/uL (ref 150–400)
RBC: 5.01 MIL/uL (ref 3.87–5.11)
RDW: 24.1 % — ABNORMAL HIGH (ref 11.5–15.5)
WBC: 14.3 10*3/uL — ABNORMAL HIGH (ref 4.0–10.5)
nRBC: 0 % (ref 0.0–0.2)

## 2020-02-10 LAB — GLUCOSE, CAPILLARY
Glucose-Capillary: 162 mg/dL — ABNORMAL HIGH (ref 70–99)
Glucose-Capillary: 168 mg/dL — ABNORMAL HIGH (ref 70–99)
Glucose-Capillary: 149 mg/dL — ABNORMAL HIGH (ref 70–99)
Glucose-Capillary: 156 mg/dL — ABNORMAL HIGH (ref 70–99)
Glucose-Capillary: 156 mg/dL — ABNORMAL HIGH (ref 70–99)

## 2020-02-10 MED ORDER — SIMETHICONE 80 MG PO CHEW
80.0000 mg | CHEWABLE_TABLET | Freq: Four times a day (QID) | ORAL | Status: DC | PRN
  Administered 2020-02-10 (×2): 80 mg via ORAL
  Filled 2020-02-10 (×3): qty 1

## 2020-02-10 NOTE — Progress Notes (Signed)
Pt's second IV infiltrated, she is refusing another iv.

## 2020-02-10 NOTE — Progress Notes (Signed)
Nutrition Note  RD consulted for diet education for patient s/p bariatric surgery. Bariatric nurse coordinator providing education.  If nutrition issues arise, please consult RD.   Katja Blue, MS, RD, LDN Inpatient Clinical Dietitian Contact information available via Amion  

## 2020-02-10 NOTE — Progress Notes (Signed)
Patient c/o gas pain. MD paged for prn. See new orders.

## 2020-02-10 NOTE — Anesthesia Postprocedure Evaluation (Signed)
Anesthesia Post Note  Patient: Melinda [name]  Procedure(s) Performed: LAPAROSCOPIC ROUX-EN-Y GASTRIC BYPASS WITH UPPER ENDOSCOPY (N/A ) UPPER GI ENDOSCOPY (N/A )     Patient location during evaluation: PACU Anesthesia Type: General Level of consciousness: awake and alert Pain management: pain level controlled Vital Signs Assessment: post-procedure vital signs reviewed and stable Respiratory status: spontaneous breathing, nonlabored ventilation, respiratory function stable and patient connected to nasal cannula oxygen Cardiovascular status: blood pressure returned to baseline and stable Postop Assessment: no apparent nausea or vomiting Anesthetic complications: no   No complications documented.               Effie Berkshire

## 2020-02-10 NOTE — Progress Notes (Signed)
Patient alert and oriented, Post op day 1.  Provided support and encouragement.  Encouraged pulmonary toilet, ambulation and small sips of liquids.  All questions answered.  Will continue to monitor. 

## 2020-02-10 NOTE — Progress Notes (Addendum)
Inpatient Diabetes Program Recommendations  AACE/ADA: New Consensus Statement on Inpatient Glycemic Control (2015)  Target Ranges:  Prepandial:   less than 140 mg/dL      Peak postprandial:   less than 180 mg/dL (1-2 hours)      Critically ill patients:  140 - 180 mg/dL   Lab Results  Component Value Date   GLUCAP 168 (H) 02/10/2020   HGBA1C 6.7 (H) 02/02/2020    Review of Glycemic Control  Diabetes history: DM2 Outpatient Diabetes medications: Lantus 70 units bid, Novolog 6 units tidwc, Victoza 1.2 mg QD Current orders for Inpatient glycemic control: Novolog 0-20 units Q4H.  HgbA1C - 6.7% POD #1 - gastric bypass CBGs 162, 168 mg/dL. Glycemic control is WNL today.  Inpatient Diabetes Program Recommendations:     Discharge home on:  Novolog 0-20 units tidwc and 0-5 units QHS  Check blood sugars at least 4x/day. Call PCP/Endo if blood sugars consistently > 180 mg/dL. D/C Lantus and Victoza.  Continue to follow while inpatient.  Thank you. Lorenda Peck, RD, LDN, CDE Inpatient Diabetes Coordinator [phone]  Addendum: Spoke with pt at bedside regarding her diabetes. Instructed her to check blood sugars at least 4x/day and cover with Novolog 0-20 units tidwc and hs. Will likely not need Lantus or Victoza. Also instructed to call her PCP/Endo if blood sugars consistently > 180 mg/dL. Pt voices understanding.  RV

## 2020-02-10 NOTE — Progress Notes (Signed)
Patient ID: Melinda [name], female   DOB: 02-26-1956, 64 y.o.   MRN: [id number] [facility] Surgery Progress Note:   1 Day Post-Op  Subjective: Mental status is clear.  Complaints slowly advancing PO. Objective: Vital signs in last 24 hours: Temp:  [97.4 F (36.3 C)-98.7 F (37.1 C)] 97.4 F (36.3 C) (10/12 1002) Pulse Rate:  [71-95] 87 (10/12 1002) Resp:  [12-20] 18 (10/12 1002) BP: (112-183)/(42-106) 143/70 (10/12 1002) SpO2:  [93 %-98 %] 96 % (10/12 1002)  Intake/Output from previous day: 10/11 0701 - 10/12 0700 In: 3016.1 [P.O.:600; I.V.:2316.1; IV Piggyback:100] Out: 2370 [Urine:2350; Blood:20] Intake/Output this shift: Total I/O In: 180 [P.O.:180] Out: 500 [Urine:500]  Physical Exam: Work of breathing is OK.  Sore in left shoulder  Lab Results:  Results for orders placed or performed during the hospital encounter of 02/09/20 (from the past 48 hour(s))  Glucose, capillary     Status: None   Collection Time: 02/09/20  8:25 AM  Result Value Ref Range   Glucose-Capillary 73 70 - 99 mg/dL    Comment: Glucose reference range applies only to samples taken after fasting for at least 8 hours.  Glucose, capillary     Status: Abnormal   Collection Time: 02/09/20 10:14 AM  Result Value Ref Range   Glucose-Capillary 66 (L) 70 - 99 mg/dL    Comment: Glucose reference range applies only to samples taken after fasting for at least 8 hours.  Glucose, capillary     Status: Abnormal   Collection Time: 02/09/20 10:46 AM  Result Value Ref Range   Glucose-Capillary 54 (L) 70 - 99 mg/dL    Comment: Glucose reference range applies only to samples taken after fasting for at least 8 hours.  Glucose, capillary     Status: Abnormal   Collection Time: 02/09/20 11:12 AM  Result Value Ref Range   Glucose-Capillary 104 (H) 70 - 99 mg/dL    Comment: Glucose reference range applies only to samples taken after fasting for at least 8 hours.  Glucose, capillary     Status: None   Collection  Time: 02/09/20 11:50 AM  Result Value Ref Range   Glucose-Capillary 99 70 - 99 mg/dL    Comment: Glucose reference range applies only to samples taken after fasting for at least 8 hours.  Glucose, capillary     Status: Abnormal   Collection Time: 02/09/20 12:55 PM  Result Value Ref Range   Glucose-Capillary 111 (H) 70 - 99 mg/dL    Comment: Glucose reference range applies only to samples taken after fasting for at least 8 hours.  Glucose, capillary     Status: None   Collection Time: 02/09/20  1:33 PM  Result Value Ref Range   Glucose-Capillary 94 70 - 99 mg/dL    Comment: Glucose reference range applies only to samples taken after fasting for at least 8 hours.   Comment 1 Notify RN   CBC     Status: Abnormal   Collection Time: 02/09/20  4:35 PM  Result Value Ref Range   WBC 12.9 (H) 4.0 - 10.5 K/uL   RBC 5.27 (H) 3.87 - 5.11 MIL/uL   Hemoglobin 11.1 (L) 12.0 - 15.0 g/dL   HCT 37.7 36 - 46 %   MCV 71.5 (L) 80.0 - 100.0 fL   MCH 21.1 (L) 26.0 - 34.0 pg   MCHC 29.4 (L) 30.0 - 36.0 g/dL   RDW 24.3 (H) 11.5 - 15.5 %   Platelets 377 150 - 400  K/uL   nRBC 0.0 0.0 - 0.2 %    Comment: Performed at [facility], [place] [address], [place], [place] [address]  Creatinine, serum     Status: Abnormal   Collection Time: 02/09/20  4:35 PM  Result Value Ref Range   Creatinine, Ser 1.48 (H) 0.44 - 1.00 mg/dL   GFR, Estimated 37 (L) >60 mL/min    Comment: Performed at [facility], [place] [address] [place], [place] [address]  Glucose, capillary     Status: Abnormal   Collection Time: 02/09/20  4:44 PM  Result Value Ref Range   Glucose-Capillary 101 (H) 70 - 99 mg/dL    Comment: Glucose reference range applies only to samples taken after fasting for at least 8 hours.  Glucose, capillary     Status: Abnormal   Collection Time: 02/09/20  7:54 PM  Result Value Ref Range   Glucose-Capillary 163 (H) 70 - 99 mg/dL    Comment: Glucose reference range applies only  to samples taken after fasting for at least 8 hours.  Glucose, capillary     Status: Abnormal   Collection Time: 02/09/20 11:30 PM  Result Value Ref Range   Glucose-Capillary 208 (H) 70 - 99 mg/dL    Comment: Glucose reference range applies only to samples taken after fasting for at least 8 hours.  Glucose, capillary     Status: Abnormal   Collection Time: 02/10/20  3:30 AM  Result Value Ref Range   Glucose-Capillary 162 (H) 70 - 99 mg/dL    Comment: Glucose reference range applies only to samples taken after fasting for at least 8 hours.  CBC WITH DIFFERENTIAL     Status: Abnormal   Collection Time: 02/10/20  5:30 AM  Result Value Ref Range   WBC 14.3 (H) 4.0 - 10.5 K/uL   RBC 5.01 3.87 - 5.11 MIL/uL   Hemoglobin 10.6 (L) 12.0 - 15.0 g/dL   HCT 35.3 (L) 36 - 46 %   MCV 70.5 (L) 80.0 - 100.0 fL   MCH 21.2 (L) 26.0 - 34.0 pg   MCHC 30.0 30.0 - 36.0 g/dL   RDW 24.1 (H) 11.5 - 15.5 %   Platelets 354 150 - 400 K/uL   nRBC 0.0 0.0 - 0.2 %   Neutrophils Relative % 84 %   Neutro Abs 11.8 (H) 1.7 - 7.7 K/uL   Lymphocytes Relative 9 %   Lymphs Abs 1.3 0.7 - 4.0 K/uL   Monocytes Relative 7 %   Monocytes Absolute 1.1 (H) 0.1 - 1.0 K/uL   Eosinophils Relative 0 %   Eosinophils Absolute 0.0 0 - 0 K/uL   Basophils Relative 0 %   Basophils Absolute 0.0 0 - 0 K/uL   Immature Granulocytes 0 %   Abs Immature Granulocytes 0.05 0.00 - 0.07 K/uL   Polychromasia PRESENT    Target Cells PRESENT     Comment: Performed at [facility], [place] [address] [place], [place] [address]  Glucose, capillary     Status: Abnormal   Collection Time: 02/10/20  7:52 AM  Result Value Ref Range   Glucose-Capillary 168 (H) 70 - 99 mg/dL    Comment: Glucose reference range applies only to samples taken after fasting for at least 8 hours.    Radiology/Results: No results found.  Anti-infectives: Anti-infectives (From admission, onward)   Start     Dose/Rate Route Frequency Ordered Stop    02/09/20 0845  cefoTEtan (CEFOTAN) 2 g in  sodium chloride 0.9 % 100 mL IVPB        2 g 200 mL/hr over 30 Minutes Intravenous On call to O.R. 02/09/20 0831 02/09/20 1012   02/09/20 0837  sodium chloride 0.9 % with cefoTEtan (CEFOTAN) ADS Med       Note to Pharmacy: Kyra Leyland   : cabinet override      02/09/20 0837 02/09/20 1021      Assessment/Plan: Problem List: Patient Active Problem List   Diagnosis Date Noted  . S/P gastric bypass 02/09/2020  . Severe recurrent major depression without psychotic features ([place]) 03/12/2018  . Cocaine abuse ([place]) 03/12/2018  . Suicidal ideation 03/12/2018  . Atherosclerosis of native arteries of the extremities with ulceration ([place]) 06/19/2017  . Gangrene of toe of left foot ([place]) 05/14/2017  . CAD (coronary artery disease), native coronary artery 05/05/2017  . Claudication in peripheral vascular disease ([place]) 10/18/2016  . Hyperlipidemia LDL goal <70 10/18/2016  . Onychomycosis 10/10/2016  . Encounters for administrative purpose 10/10/2016  . Amputated toe, left ([place]) 10/10/2016  . Amputated toe, right ([place]) 10/10/2016  . Osteomyelitis of left foot ([place]) 10/21/2015  . Hyperglycemia 10/02/2015  . Gastroesophageal reflux disease 11/16/2014  . Generalized ischemic myocardial dysfunction 07/07/2014  . Peripheral neuropathy   . Tobacco abuse   . Obesity   . 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 Chronic coronary artery disease 06/08/2014  . DM2 (diabetes mellitus, type 2) ([place])   . NSTEMI (non-ST elevated myocardial infarction) ([place]) 06/03/2014  . Hypertension 09/11/2012  . Diabetes mellitus type 2, insulin dependent ([place]) 09/11/2012    Not ready for discharge.  Advance diet 1 Day Post-Op    LOS: 1 day   Matt B. Hassell Done, MD, [facility] [phone] to reach the surgeon on call.    02/10/2020 11:02 AM

## 2020-02-11 LAB — CBC WITH DIFFERENTIAL/PLATELET
Abs Immature Granulocytes: 0.04 10*3/uL (ref 0.00–0.07)
Basophils Absolute: 0 10*3/uL (ref 0.0–0.1)
Basophils Relative: 0 %
Eosinophils Absolute: 0.2 10*3/uL (ref 0.0–0.5)
Eosinophils Relative: 2 %
HCT: 35.1 % — ABNORMAL LOW (ref 36.0–46.0)
Hemoglobin: 10.6 g/dL — ABNORMAL LOW (ref 12.0–15.0)
Immature Granulocytes: 0 %
Lymphocytes Relative: 14 %
Lymphs Abs: 1.4 10*3/uL (ref 0.7–4.0)
MCH: 21.3 pg — ABNORMAL LOW (ref 26.0–34.0)
MCHC: 30.2 g/dL (ref 30.0–36.0)
MCV: 70.6 fL — ABNORMAL LOW (ref 80.0–100.0)
Monocytes Absolute: 1.2 10*3/uL — ABNORMAL HIGH (ref 0.1–1.0)
Monocytes Relative: 12 %
Neutro Abs: 7.1 10*3/uL (ref 1.7–7.7)
Neutrophils Relative %: 72 %
Platelets: 334 10*3/uL (ref 150–400)
RBC: 4.97 MIL/uL (ref 3.87–5.11)
RDW: 24 % — ABNORMAL HIGH (ref 11.5–15.5)
WBC: 10 10*3/uL (ref 4.0–10.5)
nRBC: 0 % (ref 0.0–0.2)

## 2020-02-11 LAB — BASIC METABOLIC PANEL
Anion gap: 10 (ref 5–15)
BUN: 17 mg/dL (ref 8–23)
CO2: 24 mmol/L (ref 22–32)
Calcium: 8.5 mg/dL — ABNORMAL LOW (ref 8.9–10.3)
Chloride: 106 mmol/L (ref 98–111)
Creatinine, Ser: 1.29 mg/dL — ABNORMAL HIGH (ref 0.44–1.00)
GFR, Estimated: 43 mL/min — ABNORMAL LOW (ref >60)
Glucose, Bld: 146 mg/dL — ABNORMAL HIGH (ref 70–99)
Potassium: 4 mmol/L (ref 3.5–5.1)
Sodium: 140 mmol/L (ref 135–145)

## 2020-02-11 LAB — GLUCOSE, CAPILLARY
Glucose-Capillary: 124 mg/dL — ABNORMAL HIGH (ref 70–99)
Glucose-Capillary: 137 mg/dL — ABNORMAL HIGH (ref 70–99)
Glucose-Capillary: 141 mg/dL — ABNORMAL HIGH (ref 70–99)

## 2020-02-11 MED ORDER — INSULIN ASPART 100 UNIT/ML ~~LOC~~ SOLN: 0.0000 [IU] | SUBCUTANEOUS | 11 refills | Status: DC

## 2020-02-11 MED ORDER — ONDANSETRON 4 MG PO TBDP: 4.0000 mg | ORAL_TABLET | Freq: Four times a day (QID) | ORAL | 0 refills | Status: DC | PRN

## 2020-02-11 MED ORDER — NOVOLOG FLEXPEN 100 UNIT/ML ~~LOC~~ SOPN: PEN_INJECTOR | SUBCUTANEOUS | 11 refills | Status: DC

## 2020-02-11 MED ORDER — OXYCODONE HCL 5 MG PO TABS: 5.0000 mg | ORAL_TABLET | Freq: Four times a day (QID) | ORAL | 0 refills | Status: DC | PRN

## 2020-02-11 MED ORDER — PANTOPRAZOLE SODIUM 40 MG PO TBEC: 40.0000 mg | DELAYED_RELEASE_TABLET | Freq: Every day | ORAL | 0 refills | Status: DC

## 2020-02-11 NOTE — Discharge Summary (Signed)
Physician Discharge Summary  Patient ID: Melinda [name] MRN: [id number] DOB/AGE: 05/26/1955 64 y.o.  PCP: Donnie Coffin, MD  Admit date: 02/09/2020 Discharge date: 02/11/2020  Admission Diagnoses:  Morbid obesity, DM, peripheral vascular disease  Discharge Diagnoses:  same  Active Problems:   S/P gastric bypass   Surgery:  Lap gastric bypass  Discharged Condition: improved  Hospital Course:   Had surgery.  Started on liquids and advanced slowly and ready for discharge on PD 2  Consults: none  Significant Diagnostic Studies: none    Discharge Exam: Blood pressure 127/66, pulse 100, temperature 98.3 F (36.8 C), temperature source Oral, resp. rate 16, height 5' 8.5" (1.74 m), weight 133.1 kg, SpO2 94 %. Incisions OK.  Pt dressed and ready to go.   Disposition: Discharge disposition: 01-Home or Self Care       Discharge Instructions    Ambulate hourly while awake   Complete by: As directed    Call MD for:  difficulty breathing, headache or visual disturbances   Complete by: As directed    Call MD for:  persistant dizziness or light-headedness   Complete by: As directed    Call MD for:  persistant nausea and vomiting   Complete by: As directed    Call MD for:  redness, tenderness, or signs of infection (pain, swelling, redness, odor or green/yellow discharge around incision site)   Complete by: As directed    Call MD for:  severe uncontrolled pain   Complete by: As directed    Call MD for:  temperature >101 F   Complete by: As directed    Diet bariatric full liquid   Complete by: As directed    Discharge instructions   Complete by: As directed    Follow sliding scale   Incentive spirometry   Complete by: As directed    Perform hourly while awake     Allergies as of 02/11/2020   No Known Allergies     Medication List    STOP taking these medications   aspirin 81 MG EC tablet   Lantus SoloStar 100 UNIT/ML Solostar Pen Generic drug: insulin  glargine   Victoza 18 MG/3ML Sopn Generic drug: liraglutide     TAKE these medications   Accu-Chek Aviva Plus test strip Generic drug: glucose blood 1 each 3 (three) times daily.   Accu-Chek Softclix Lancets lancets 1 each 3 (three) times daily.   amLODipine 5 MG tablet Commonly known as: NORVASC Take 5 mg by mouth daily. Notes to patient: Monitor Blood Pressure Daily and keep a log for primary care physician.  You may need to make changes to your medications with rapid weight loss.     atorvastatin 80 MG tablet Commonly known as: LIPITOR TAKE 1 TABLET EVERY DAY  AT  6  PM What changed:   See the new instructions.  Another medication with the same name was removed. Continue taking this medication, and follow the directions you see here.   CERAVE EX Apply 1 application topically in the morning and at bedtime. Mixed with clobetasol   clobetasol 0.05 % external solution Commonly known as: TEMOVATE Apply 1 application topically 2 (two) times daily as needed. What changed: Another medication with the same name was changed. Make sure you understand how and when to take each.   clobetasol 0.05 % external solution Commonly known as: TEMOVATE Patient to mix entire bottle with 1 jar of CeraVe and apply twice a day x 1 month then as needed.  Avoid face, groin, underarms. What changed:   how much to take  how to take this  when to take this   clopidogrel 75 MG tablet Commonly known as: PLAVIX Take 1 tablet (75 mg total) by mouth daily.   Droplet Pen Needles 31G X 6 MM Misc Generic drug: Insulin Pen Needle   DULoxetine 60 MG capsule Commonly known as: CYMBALTA Take 60 mg by mouth daily.   Eflornithine HCl 13.9 % cream Apply topically 2 (two) times daily with a meal. What changed: Another medication with the same name was changed. Make sure you understand how and when to take each.   Eflornithine HCl 13.9 % cream Apply to upper lip, chin and neck twice a day. What  changed:   how much to take  how to take this  when to take this   ferrous sulfate 325 (65 FE) MG tablet Take 325 mg by mouth daily.   furosemide 20 MG tablet Commonly known as: LASIX TAKE 1 TABLET (20 MG TOTAL) BY MOUTH DAILY AS NEEDED. What changed:   how much to take  how to take this  when to take this  reasons to take this  additional instructions  Another medication with the same name was removed. Continue taking this medication, and follow the directions you see here.   lidocaine 5 % Commonly known as: LIDODERM Place 1 patch onto the skin daily as needed (PAIN.).   lisinopril 5 MG tablet Commonly known as: ZESTRIL Take 1 tablet (5 mg total) by mouth daily. Notes to patient: Monitor Blood Pressure Daily and keep a log for primary care physician.  You may need to make changes to your medications with rapid weight loss.     metoprolol tartrate 25 MG tablet Commonly known as: LOPRESSOR Take 12.5 mg by mouth 2 (two) times daily. What changed: Another medication with the same name was removed. Continue taking this medication, and follow the directions you see here.   NovoLOG FlexPen 100 UNIT/ML FlexPen Generic drug: insulin aspart Take according to previously discusses sliding scale.  *HOLD FOR LOW BLOOD GLUCOSE READING* What changed:   how much to take  how to take this  when to take this  additional instructions Notes to patient: CBG 70 - 120: 0 units  CBG 121 - 150: 3 units  CBG 151 - 200: 4 units  CBG 201 - 250: 7 units  CBG 251 - 300: 11 units  CBG 301 - 350: 15 units  CBG 351 - 400: 20 units    insulin aspart 100 UNIT/ML injection Commonly known as: novoLOG Inject 0-20 Units into the skin every 4 (four) hours. What changed: You were already taking a medication with the same name, and this prescription was added. Make sure you understand how and when to take each.   ondansetron 4 MG disintegrating tablet Commonly known as: ZOFRAN-ODT Take 1  tablet (4 mg total) by mouth every 6 (six) hours as needed for nausea or vomiting.   oxybutynin 5 MG tablet Commonly known as: DITROPAN Take 5 mg by mouth 2 (two) times daily.   oxyCODONE 5 MG immediate release tablet Commonly known as: Oxy IR/ROXICODONE Take 1 tablet (5 mg total) by mouth every 6 (six) hours as needed for severe pain.   pantoprazole 40 MG tablet Commonly known as: PROTONIX Take 1 tablet (40 mg total) by mouth daily.   potassium chloride SA 20 MEQ tablet Commonly known as: KLOR-CON TAKE 1 TABLET (20 MEQ TOTAL) BY MOUTH  DAILY AS NEEDED. What changed:   how much to take  how to take this  when to take this  reasons to take this  additional instructions  Another medication with the same name was removed. Continue taking this medication, and follow the directions you see here.   pregabalin 100 MG capsule Commonly known as: LYRICA Take 100 mg by mouth 3 (three) times daily.   tiZANidine 4 MG tablet Commonly known as: ZANAFLEX Take 4 mg by mouth 3 (three) times daily.       Follow-up Information    Surgery, [place]. Go on 03/03/2020.   Specialty: General Surgery Why: at 76 with Dr Johnathan Hausen.  Please arrive 15 minutes prior to appointment time.  Thank you Contact information: [address] [place] [address] [phone]        Johnathan Hausen, MD. Go on 04/07/2020.   Specialty: General Surgery Why: Your appointment is in the [facility] with Dr Johnathan Hausen at9 am. Contact information: [place] [address] [phone]               Signed: Pedro Earls 02/11/2020, 8:01 AM

## 2020-02-11 NOTE — Progress Notes (Signed)
Discharge instructions given to patient and all questions were answered. Patient requested to sit in the lobby to wait on family. Tech took pt down via wheelchair to wait in the lobby for family.

## 2020-02-11 NOTE — Progress Notes (Signed)
Patient alert and oriented, pain is controlled. Patient is tolerating fluids, advanced to protein shake today, patient is tolerating well. Reviewed Gastric Bypass discharge instructions with patient and patient is able to articulate understanding. Provided information on BELT program, Support Group and [facility] outpatient pharmacy. All questions answered, will continue to monitor.   Total fluid intake 840 Per dehydration protocol call back one week post op 

## 2020-02-16 ENCOUNTER — Telehealth (HOSPITAL_COMMUNITY): Payer: Self-pay

## 2020-02-16 NOTE — Telephone Encounter (Signed)
Patient called to discuss post bariatric surgery follow up questions.  See below:   1.  Tell me about your pain and pain management?denies  2.  Let's talk about fluid intake.  How much total fluid are you taking in?1/2 gallon water, 40 ounces from other fluids  3.  How much protein have you taken in the last 2 days?60 grams  4.  Have you had nausea?  Tell me about when have experienced nausea and what you did to help?denies  5.  Has the frequency or color changed with your urine?urine light in color  6.  Tell me what your incisions look like?no problems  7.  Have you been passing gas? BM?had bm since discharge  8.  If a problem or question were to arise who would you call?  Do you know contact numbers for [facility], [facility], and [facility]?aware of how to contact all services  9.  How has the walking going?walking around home every home  10.  How are your vitamins and calcium going?  How are you taking them?mvi and calcium started   Reminded of follow up with RD.  CBG's at home 132-145.  Using sliding scare novolog

## 2020-02-17 ENCOUNTER — Other Ambulatory Visit: Payer: Self-pay

## 2020-02-17 ENCOUNTER — Encounter: Payer: Medicare HMO | Attending: Physician Assistant | Admitting: Physician Assistant

## 2020-02-17 DIAGNOSIS — E1151 Type 2 diabetes mellitus with diabetic peripheral angiopathy without gangrene: Secondary | ICD-10-CM | POA: Insufficient documentation

## 2020-02-17 DIAGNOSIS — Z89421 Acquired absence of other right toe(s): Secondary | ICD-10-CM | POA: Insufficient documentation

## 2020-02-17 DIAGNOSIS — L84 Corns and callosities: Secondary | ICD-10-CM | POA: Insufficient documentation

## 2020-02-17 DIAGNOSIS — Z951 Presence of aortocoronary bypass graft: Secondary | ICD-10-CM | POA: Diagnosis not present

## 2020-02-17 DIAGNOSIS — L97522 Non-pressure chronic ulcer of other part of left foot with fat layer exposed: Secondary | ICD-10-CM | POA: Diagnosis not present

## 2020-02-17 DIAGNOSIS — E11621 Type 2 diabetes mellitus with foot ulcer: Secondary | ICD-10-CM | POA: Insufficient documentation

## 2020-02-17 DIAGNOSIS — Z89422 Acquired absence of other left toe(s): Secondary | ICD-10-CM | POA: Diagnosis not present
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
## 2020-02-18 NOTE — Progress Notes (Signed)
ORPAH, HAUSNER ([id number]) Visit Report for 02/17/2020 Arrival Information Details Patient Name: Melinda [name], Melinda [name] Date of Service: 02/17/2020 11:00 AM Medical Record Number: [id number] Patient Account Number: [id number] Date of Birth/Sex: January 14, 1956 (64 y.o. F) Treating RN: [name] Primary Care Shannara Winbush: [name] Other Clinician: Referring Antwanette Wesche: [name] Treating Kaileena Obi/Extender: [name], [name]: 9 Visit Information History Since Last Visit All ordered tests and consults were completed: No Patient Arrived: Ambulatory Added or deleted any medications: Yes Arrival Time: 11:09 Any new allergies or adverse reactions: No Accompanied By: self Had a fall or experienced change in No Transfer Assistance: None activities of daily living that may affect Patient Identification Verified: Yes risk of falls: Secondary Verification Process Completed: Yes Signs or symptoms of abuse/neglect since last visito No Patient Requires Transmission-Based No Hospitalized since last visit: No Precautions: Implantable device outside of the clinic excluding No Patient Has Alerts: Yes cellular tissue based products placed in the center Patient Alerts: 10/28/19 ABI: R 1.05 since last visit: L .60 Has Dressing in Place as Prescribed: Yes Pain Present Now: No Electronic Signature(s) Signed: 02/18/2020 4:54:21 PM By: Carlene Coria RN Entered By: Carlene Coria on 02/17/2020 11:13:18 Melinda [name] ([id number]) -------------------------------------------------------------------------------- Encounter Discharge Information Details Patient Name: Melinda [name] Date of Service: 02/17/2020 11:00 AM Medical Record Number: [id number] Patient Account Number: [id number] Date of Birth/Sex: [birth year] (64 y.o. F) Treating RN: [name] Primary Care Ieasha Boerema: [name] Other Clinician: Referring Tiarna Koppen: [name] Treating Aleksandra Raben/Extender: [name], [name] Weeks in  Treatment: 9 Encounter Discharge Information Items Post Procedure Vitals Discharge Condition: Stable Unable to obtain vitals Reason: . Ambulatory Status: Ambulatory Discharge Destination: Home Transportation: Private Auto Accompanied By: self Schedule Follow-up Appointment: Yes Clinical Summary of Care: Electronic Signature(s) Signed: 02/17/2020 12:12:22 PM By: [name], [name], RN, [name], [name] RN, [name] Entered By: [name], [name], RN, [name], [name] on 02/17/2020 12:12:22 Melinda [name] ([id number]) -------------------------------------------------------------------------------- Lower Extremity Assessment Details Patient Name: Melinda [name], Melinda [name] Date of Service: 02/17/2020 11:00 AM Medical Record Number: [id number] Patient Account Number: [id number] Date of Birth/Sex: Apr 04, 1956 (64 y.o. F) Treating RN: Carlene Coria Primary Care Marilyne Haseley: [name] Other Clinician: Referring Kris No: [name] Treating Srinivas Lippman/Extender: [name], [name]: 9 Edema Assessment Assessed: [Left: No] [Right: No] [Left: Edema] [Right: :] Calf Left: Right: Point of Measurement: 42 cm From Medial Instep 40 cm Ankle Left: Right: Point of Measurement: 10 cm From Medial Instep 23 cm Electronic Signature(s) Signed: 02/18/2020 4:54:21 PM By: Carlene Coria RN Entered By: Carlene Coria on 02/17/2020 11:33:36 Melinda [name] ([id number]) -------------------------------------------------------------------------------- Multi Wound Chart Details Patient Name: Melinda [name] Date of Service: 02/17/2020 11:00 AM Medical Record Number: [id number] Patient Account Number: [id number] Date of Birth/Sex: 04-16-1956 (64 y.o. F) Treating RN: [name] Primary Care Thirza Pellicano: [name] Other Clinician: Referring Evalyn Shultis: [name] Treating Dymin Dingledine/Extender: [name], [name]: 9 Vital Signs Height(in): 69 Capillary Blood Glucose 152 (mg/dl): Weight(lbs): Pulse(bpm): 76 Body Mass  Index(BMI): Blood Pressure(mmHg): 111/72 Temperature(F): 98.3 Respiratory Rate(breaths/min): 16 Photos: [N/A:N/A] Wound Location: Left, Plantar Toe Great N/A N/A Wounding Event: Gradually Appeared N/A N/A Primary Etiology: Diabetic Wound/Ulcer of the Lower N/A N/A Extremity Comorbid History: Cataracts, Coronary Artery Disease, N/A N/A Hypertension, Myocardial Infarction, Peripheral Venous Disease, Type II Diabetes, Osteoarthritis, Osteomyelitis, Neuropathy Date Acquired: 12/02/2019 N/A N/A Weeks of Treatment: 9 N/A N/A Wound Status: Open N/A N/A Measurements L x W x D (cm) 0.4x0.4x0.7 N/A N/A Area (cm) : 0.126 N/A N/A Volume (  cm) : 0.088 N/A N/A % Reduction in Area: 95.00% N/A N/A % Reduction in Volume: 96.10% N/A N/A Classification: Grade 2 N/A N/A Exudate Amount: Medium N/A N/A Exudate Type: Serosanguineous N/A N/A Exudate Color: red, brown N/A N/A Granulation Amount: Medium (34-66%) N/A N/A Granulation Quality: Pink N/A N/A Necrotic Amount: Medium (34-66%) N/A N/A Exposed Structures: Fat Layer (Subcutaneous Tissue): N/A N/A Yes Fascia: No Tendon: No Muscle: No Joint: No Bone: No Epithelialization: None N/A N/A Treatment Notes Electronic Signature(s) Signed: 02/17/2020 5:31:50 PM By: [name], [name], RN, [name], [name] RN, [name] Entered By: [name], [name], RN, [name], [name] on 02/17/2020 11:42:24 Melinda [name] ([id number]) -------------------------------------------------------------------------------- [place] Details Patient Name: Melinda [name] Date of Service: 02/17/2020 11:00 AM Medical Record Number: [id number] Patient Account Number: [id number] Date of Birth/Sex: 1955-07-22 (64 y.o. F) Treating RN: [name] Primary Care Jeaninne Lodico: [name] Other Clinician: Referring Vlada Uriostegui: [name] Treating Seerat Peaden/Extender: [name], [name]: 9 Active Inactive Necrotic Tissue Nursing Diagnoses: Impaired tissue integrity related to  necrotic/devitalized tissue Goals: Necrotic/devitalized tissue will be minimized in the wound bed Date Initiated: 12/16/2019 Target Resolution Date: 12/26/2019 Goal Status: Active Interventions: Assess patient pain level pre-, during and post procedure and prior to discharge Treatment Activities: Excisional debridement : 12/16/2019 Notes: Orientation to the Wound Care Program Nursing Diagnoses: Knowledge deficit related to the wound healing center program Goals: Patient/caregiver will verbalize understanding of the [place] Date Initiated: 12/16/2019 Target Resolution Date: 12/26/2019 Goal Status: Active Interventions: Provide education on orientation to the wound center Notes: Peripheral Neuropathy Nursing Diagnoses: Knowledge deficit related to disease process and management of peripheral neurovascular dysfunction Potential alteration in peripheral tissue perfusion (select prior to confirmation of diagnosis) Goals: Patient/caregiver will verbalize understanding of disease process and disease management Date Initiated: 12/16/2019 Target Resolution Date: 12/26/2019 Goal Status: Active Interventions: Provide education on Management of Neuropathy and Related Ulcers Treatment Activities: Patient referred for customized footwear/offloading : 12/16/2019 Notes: Pressure Nursing Diagnoses: JAMIELEE, MCHALE ([id number]) Knowledge deficit related to causes and risk factors for pressure ulcer development Knowledge deficit related to management of pressures ulcers Goals: Patient will remain free from development of additional pressure ulcers Date Initiated: 12/16/2019 Target Resolution Date: 12/26/2019 Goal Status: Active Interventions: Provide education on pressure ulcers Notes: Wound/Skin Impairment Nursing Diagnoses: Impaired tissue integrity Goals: Ulcer/skin breakdown will have a volume reduction of 30% by week 4 Date Initiated: 12/16/2019 Target Resolution  Date: 01/16/2020 Goal Status: Active Interventions: Assess patient/caregiver ability to obtain necessary supplies Assess ulceration(s) every visit Treatment Activities: Skin care regimen initiated : 12/16/2019 Notes: Electronic Signature(s) Signed: 02/17/2020 5:31:50 PM By: [name], [name], RN, [name], [name] RN, [name] Entered By: [name], [name], RN, [name], [name] on 02/17/2020 11:41:07 Melinda [name] ([id number]) -------------------------------------------------------------------------------- Pain Assessment Details Patient Name: Melinda [name] Date of Service: 02/17/2020 11:00 AM Medical Record Number: [id number] Patient Account Number: [id number] Date of Birth/Sex: 02/04/1956 (64 y.o. F) Treating RN: Carlene Coria Primary Care Azie Mcconahy: [name] Other Clinician: Referring Daryus Sowash: [name] Treating Ellena Kamen/Extender: [name], [name]: 9 Active Problems Location of Pain Severity and Description of Pain Patient Has Paino No Site Locations Pain Management and Medication Current Pain Management: Electronic Signature(s) Signed: 02/18/2020 4:54:21 PM By: Carlene Coria RN Entered By: Carlene Coria on 02/17/2020 11:14:14 Melinda [name] ([id number]) -------------------------------------------------------------------------------- Patient/Caregiver Education Details Patient Name: Melinda [name] Date of Service: 02/17/2020 11:00 AM Medical Record Number: [id number] Patient Account Number: [id number] Date of Birth/Gender: 09-Jul-1955 (64 y.o. F)  Treating RN: [name] Primary Care Physician: [name] Other Clinician: Referring Physician: [name] Treating Physician/Extender: Sharalyn Ink in Treatment: 9 Education Assessment Education Provided To: Patient Education Topics Provided Pressure: Handouts: Pressure Ulcers: Care and Offloading, Preventing Pressure Ulcers Methods: Demonstration, Explain/Verbal Responses: State content correctly Electronic  Signature(s) Signed: 02/17/2020 5:31:50 PM By: [name], [name], RN, [name], [name] RN, [name] Entered By: [name], [name], RN, [name], [name] on 02/17/2020 12:56:24 Melinda [name] ([id number]) -------------------------------------------------------------------------------- Wound Assessment Details Patient Name: Melinda [name], Melinda [name] Date of Service: 02/17/2020 11:00 AM Medical Record Number: [id number] Patient Account Number: [id number] Date of Birth/Sex: [birth year] (64 y.o. F) Treating RN: Carlene Coria Primary Care Breniya Goertzen: [name] Other Clinician: Referring Pratyush Ammon: [name] Treating Chrishonda Hesch/Extender: [name], [name]: 9 Wound Status Wound Number: 5 Primary Diabetic Wound/Ulcer of the Lower Extremity Etiology: Wound Location: Left, Plantar Toe Great Wound Open Wounding Event: Gradually Appeared Status: Date Acquired: 12/02/2019 Comorbid Cataracts, Coronary Artery Disease, Hypertension, Weeks Of Treatment: 9 History: Myocardial Infarction, Peripheral Venous Disease, Type II Clustered Wound: No Diabetes, Osteoarthritis, Osteomyelitis, Neuropathy Photos Wound Measurements Length: (cm) 0.4 % Re Width: (cm) 0.4 % Re Depth: (cm) 0.7 Epit Area: (cm) 0.126 Tun Volume: (cm) 0.088 Und duction in Area: 95% duction in Volume: 96.1% helialization: None neling: No ermining: No Wound Description Classification: Grade 2 Foul Exudate Amount: Medium Slou Exudate Type: Serosanguineous Exudate Color: red, brown Odor After Cleansing: No gh/Fibrino Yes Wound Bed Granulation Amount: Medium (34-66%) Exposed Structure Granulation Quality: Pink Fascia Exposed: No Necrotic Amount: Medium (34-66%) Fat Layer (Subcutaneous Tissue) Exposed: Yes Necrotic Quality: Adherent Slough Tendon Exposed: No Muscle Exposed: No Joint Exposed: No Bone Exposed: No Treatment Notes Wound #5 (Left, Plantar Toe Great) Notes prisma, gauze and conform, peg assist shoe Electronic Signature(s) Signed:  02/18/2020 4:54:21 PM By: Carlene Coria RN Melinda [name], Melinda E. ([id number]) Entered By: Carlene Coria on 02/17/2020 11:26:22 Melinda [name] ([id number]) -------------------------------------------------------------------------------- Vitals Details Patient Name: Melinda [name] Date of Service: 02/17/2020 11:00 AM Medical Record Number: [id number] Patient Account Number: [id number] Date of Birth/Sex: 01-18-1956 (64 y.o. F) Treating RN: Carlene Coria Primary Care Sudeep Scheibel: [name] Other Clinician: Referring Christian [name]: [name] Treating Brecklyn Galvis/Extender: [name], [name]: 9 Vital Signs Time Taken: 11:13 Temperature (F): 98.3 Height (in): 69 Pulse (bpm): 76 Respiratory Rate (breaths/min): 16 Blood Pressure (mmHg): 111/72 Capillary Blood Glucose (mg/dl): 152 Reference Range: 80 - 120 mg / dl Notes CBG per patient Electronic Signature(s) Signed: 02/18/2020 4:54:21 PM By: Carlene Coria RN Entered By: Carlene Coria on 02/17/2020 11:13:59

## 2020-02-20 ENCOUNTER — Encounter: Payer: Medicare HMO | Attending: Physician Assistant | Admitting: Dietician

## 2020-02-20 ENCOUNTER — Other Ambulatory Visit: Payer: Self-pay | Admitting: Physician Assistant

## 2020-02-20 ENCOUNTER — Encounter: Payer: Self-pay | Admitting: Dietician

## 2020-02-20 ENCOUNTER — Other Ambulatory Visit: Payer: Self-pay

## 2020-02-20 VITALS — Ht 68.5 in | Wt 280.2 lb

## 2020-02-20 DIAGNOSIS — Z6841 Body Mass Index (BMI) 40.0 and over, adult: Secondary | ICD-10-CM | POA: Insufficient documentation

## 2020-02-20 DIAGNOSIS — I255 Ischemic cardiomyopathy: Secondary | ICD-10-CM

## 2020-02-20 NOTE — Patient Instructions (Addendum)
   Begin including some solid protein foods, such as eggs, low fat cottage cheese or other cheese, or lean, moist meat like 93% lean ground beef.   Start with cottage cheese, yogurt for 2-4 days, then add meats and other cheeses.   Contact Dr. Hassell Done if there are any more issues with constipation.   Take 2 Flintstones vitamins 2 times a day, and add a calcium chew 3 times a day, at least 2 hours apart from the multivitamin.

## 2020-02-20 NOTE — Progress Notes (Signed)
Nutrition Therapy for Post-Operative Bariatric Diet Follow-up visit:  2 weeks post-op Gastric Bypass Surgery  Medical Nutrition Therapy:  Appt start time: 1330 end time:  1400.  Anthropometrics: Weight: 280.2lbs Height: 5'8.5"  Date 02/20/20 01/26/20  BMI 41.98 44.23  Weight (lbs) 280.2 295.2  Skeletal muscle (lbs)    % body fat     Clinical: Medications: reconciled list in medical record Supplementation: chewable multivitamin 2tabs 1x daily; iron supplement daily Health/ medical history changes: no changes GI symptoms: N/V/D/C: no nausea, vomiting, or diarrhea; reports recent severe constipation -- now resolving. Dumping Syndrome: no Hair loss: no  Dietary/ Lifestyle Progress: . Patient reports surgery went well, no nausea, and only minimal pain.  . She reports severe constipation, no BM since before surgery (02/09/20) until 02/18/20; she has been taking OTC laxative. Was taking Miralax regularly prior to surgery. . She voices readiness to begin eating solid foods. She is doing well with Premier protein drinks.   Dietary recall: Eating pattern: sips protein drinks alternately with chicken broth, decaf coffee and other beverages throughout the day Dining out:  Breakfast: protein shake -- sips until finished Snack: fluids  Lunch: protein shake Snack: fluids  Dinner: chicken broth Snack: fluids  Fluid intake: protein shakes 22oz, water 32oz, tea, chicken broth, coffee 12-16oz Estimated total protein intake: 60grams Bariatric diet adherence:  . Using straws: no . Drinking fluids during meals: not applicable . Carbonated beverages: none  Recent physical activity:  Increased daily walking around home   Nutrition Intervention:   . Reviewed progress since surgery. . Instructed on advancement of diet to include solid protein foods, beginning with very soft foods for 2-4 days, then adding lean, moist meats and other protein foods. Reviewed importance of small bites, chewing  thoroughly, and avoiding fluids around meal times.  . Reviewed goals for protein and fluids, and encouraged daily tracking to monitor intake.  . Reviewed goals for vitamin supplementation, taking 2 tabs twice daily of non-bariatric multivitamin + need for calcium supplementation 3x daily 2 or more hours apart from multivitamin.   Discussed role of adequate fluid intake, regular movement ie walking in reducing constipation.  Nutritional Diagnosis:  [place]-3.3 Overweight/obesity As related to history of excess calories and inadequate physical activity.  As evidenced by patient with current BMI of 41.98, following bariatric diet guidelines for ongoing weight loss after gastric bypass surgery.  Teaching Method Utilized:  Visual Auditory Hands on  Materials provided:  Phase 3 and 4 bariatric diet handouts  Visit summary with goals and instructions  Learning Readiness:   Change in progress  Barriers to learning/adherence to lifestyle change: none  Demonstrated degree of understanding via:  Teach Back      Plan: Return for 2 month post-op visit on 04/16/20 at 1:30pm

## 2020-02-24 ENCOUNTER — Ambulatory Visit: Payer: Medicare HMO

## 2020-02-26 ENCOUNTER — Other Ambulatory Visit: Payer: Self-pay

## 2020-02-26 ENCOUNTER — Encounter: Payer: Medicare HMO | Admitting: Physician Assistant

## 2020-02-26 DIAGNOSIS — E11621 Type 2 diabetes mellitus with foot ulcer: Secondary | ICD-10-CM | POA: Diagnosis not present
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
## 2020-02-27 NOTE — Progress Notes (Signed)
FELICITAS, SINE ([id number]) Visit Report for 02/26/2020 Arrival Information Details Patient Name: Melinda [name], Melinda [name] Date of Service: 02/26/2020 3:30 PM Medical Record Number: [id number] Patient Account Number: [id number] Date of Birth/Sex: 02-07-1956 (64 y.o. F) Treating RN: [name] Primary Care Luanne Krzyzanowski: [name] Other Clinician: Referring Delina Kruczek: [name] Treating Philipp Callegari/Extender: Skipper Cliche in Treatment: 10 Visit Information History Since Last Visit Added or deleted any medications: No Patient Arrived: Ambulatory Any new allergies or adverse reactions: No Arrival Time: 15:42 Had a fall or experienced change in No Accompanied By: self activities of daily living that may affect Transfer Assistance: None risk of falls: Patient Identification Verified: Yes Signs or symptoms of abuse/neglect since last visito No Secondary Verification Process Completed: Yes Hospitalized since last visit: No Patient Requires Transmission-Based No Implantable device outside of the clinic excluding No Precautions: cellular tissue based products placed in the center Patient Has Alerts: Yes since last visit: Patient Alerts: 10/28/19 ABI: R 1.05 Has Dressing in Place as Prescribed: Yes L .60 Pain Present Now: No Electronic Signature(s) Signed: 02/26/2020 4:39:22 PM By: Lorine Bears RCP, RRT, CHT Entered By: Lorine Bears on 02/26/2020 15:47:37 Melinda [name] ([id number]) -------------------------------------------------------------------------------- Lower Extremity Assessment Details Patient Name: Melinda [name] Date of Service: 02/26/2020 3:30 PM Medical Record Number: [id number] Patient Account Number: [id number] Date of Birth/Sex: 10-Jul-1955 (64 y.o. F) Treating RN: [name] Primary Care Rosebud Koenen: [name] Other Clinician: Referring Molina Hollenback: [name] Treating Emalea Mix/Extender: Skipper Cliche in Treatment: 10 Vascular  Assessment Pulses: Dorsalis Pedis Palpable: [Left:Yes] Electronic Signature(s) Signed: 02/26/2020 5:25:08 PM By: [name], [name], RN, [name], [name] RN, [name] Entered By: [name], [name], RN, [name], [name] on 02/26/2020 16:03:11 Melinda [name] ([id number]) -------------------------------------------------------------------------------- Multi Wound Chart Details Patient Name: Melinda [name] Date of Service: 02/26/2020 3:30 PM Medical Record Number: [id number] Patient Account Number: [id number] Date of Birth/Sex: [birth year] (64 y.o. F) Treating RN: [name] Primary Care Vinette Crites: [name] Other Clinician: Referring Soraiya Ahner: [name] Treating Timmie Calix/Extender: Skipper Cliche in Treatment: 10 Vital Signs Height(in): 69 Pulse(bpm): 80 Weight(lbs): Blood Pressure(mmHg): 138/67 Body Mass Index(BMI): Temperature(F): 98.5 Respiratory Rate(breaths/min): 16 Photos: [N/A:N/A] Wound Location: Left, Plantar Toe Great N/A N/A Wounding Event: Gradually Appeared N/A N/A Primary Etiology: Diabetic Wound/Ulcer of the Lower N/A N/A Extremity Comorbid History: Cataracts, Coronary Artery Disease, N/A N/A Hypertension, Myocardial Infarction, Peripheral Venous Disease, Type II Diabetes, Osteoarthritis, Osteomyelitis, Neuropathy Date Acquired: 12/02/2019 N/A N/A Weeks of Treatment: 10 N/A N/A Wound Status: Open N/A N/A Measurements L x W x D (cm) 0.2x0.2x0.4 N/A N/A Area (cm) : 0.031 N/A N/A Volume (cm) : 0.013 N/A N/A % Reduction in Area: 98.80% N/A N/A % Reduction in Volume: 99.40% N/A N/A Classification: Grade 2 N/A N/A Exudate Amount: Medium N/A N/A Exudate Type: Serous N/A N/A Exudate Color: amber N/A N/A Wound Margin: Thickened N/A N/A Granulation Amount: Medium (34-66%) N/A N/A Granulation Quality: Pink N/A N/A Necrotic Amount: Medium (34-66%) N/A N/A Exposed Structures: Fat Layer (Subcutaneous Tissue): N/A N/A Yes Fascia: No Tendon: No Muscle: No Joint: No Bone: No  Epithelialization: None N/A N/A Treatment Notes Electronic Signature(s) Signed: 02/26/2020 5:25:08 PM By: [name], [name], RN, [name], [name] RN, [name] Entered By: [name], [name], RN, [name], [name] on 02/26/2020 16:04:45 Melinda [name] ([id number] [name] ([id number]) -------------------------------------------------------------------------------- [place] Details Patient Name: Melinda [name], Melinda [name] Date of Service: 02/26/2020 3:30 PM Medical Record Number: [id number] Patient Account Number: [id number] Date of Birth/Sex: [birth year] (64 y.o. F) Treating RN: [name] Primary  Care Saia Derossett: [name] Other Clinician: Referring Tieara Flitton: [name] Treating Conley Delisle/Extender: Skipper Cliche in Treatment: 10 Active Inactive Necrotic Tissue Nursing Diagnoses: Impaired tissue integrity related to necrotic/devitalized tissue Goals: Necrotic/devitalized tissue will be minimized in the wound bed Date Initiated: 12/16/2019 Target Resolution Date: 12/26/2019 Goal Status: Active Interventions: Assess patient pain level pre-, during and post procedure and prior to discharge Treatment Activities: Excisional debridement : 12/16/2019 Notes: Orientation to the Wound Care Program Nursing Diagnoses: Knowledge deficit related to the wound healing center program Goals: Patient/caregiver will verbalize understanding of the [place] Date Initiated: 12/16/2019 Target Resolution Date: 12/26/2019 Goal Status: Active Interventions: Provide education on orientation to the wound center Notes: Peripheral Neuropathy Nursing Diagnoses: Knowledge deficit related to disease process and management of peripheral neurovascular dysfunction Potential alteration in peripheral tissue perfusion (select prior to confirmation of diagnosis) Goals: Patient/caregiver will verbalize understanding of disease process and disease management Date Initiated: 12/16/2019 Target Resolution Date:  12/26/2019 Goal Status: Active Interventions: Provide education on Management of Neuropathy and Related Ulcers Treatment Activities: Patient referred for customized footwear/offloading : 12/16/2019 Notes: Pressure Nursing Diagnoses: TYMIA, STREB ([id number]) Knowledge deficit related to causes and risk factors for pressure ulcer development Knowledge deficit related to management of pressures ulcers Goals: Patient will remain free from development of additional pressure ulcers Date Initiated: 12/16/2019 Target Resolution Date: 12/26/2019 Goal Status: Active Interventions: Provide education on pressure ulcers Notes: Wound/Skin Impairment Nursing Diagnoses: Impaired tissue integrity Goals: Ulcer/skin breakdown will have a volume reduction of 30% by week 4 Date Initiated: 12/16/2019 Target Resolution Date: 01/16/2020 Goal Status: Active Interventions: Assess patient/caregiver ability to obtain necessary supplies Assess ulceration(s) every visit Treatment Activities: Skin care regimen initiated : 12/16/2019 Notes: Electronic Signature(s) Signed: 02/26/2020 5:25:08 PM By: [name], [name], RN, [name], [name] RN, [name] Entered By: [name], [name], RN, [name], [name] on 02/26/2020 16:04:37 Melinda [name] ([id number]) -------------------------------------------------------------------------------- Pain Assessment Details Patient Name: Melinda [name] Date of Service: 02/26/2020 3:30 PM Medical Record Number: [id number] Patient Account Number: [id number] Date of Birth/Sex: [birth year] (64 y.o. F) Treating RN: [name] Primary Care Maicy Filip: [name] Other Clinician: Referring Zyree Traynham: [name] Treating Inocente Krach/Extender: Skipper Cliche in Treatment: 10 Active Problems Location of Pain Severity and Description of Pain Patient Has Paino No Site Locations Pain Management and Medication Current Pain Management: Electronic Signature(s) Signed: 02/26/2020 5:25:08 PM By: [name], [name], RN, [name],  [name] RN, [name] Entered By: [name], [name], RN, [name], [name] on 02/26/2020 15:58:31 Melinda [name] ([id number]) -------------------------------------------------------------------------------- Wound Assessment Details Patient Name: Melinda [name], Melinda [name] Date of Service: 02/26/2020 3:30 PM Medical Record Number: [id number] Patient Account Number: [id number] Date of Birth/Sex: [birth year] (64 y.o. F) Treating RN: [name] Primary Care Yailen Zemaitis: [name] Other Clinician: Referring Korene Dula: [name] Treating Zada Haser/Extender: Skipper Cliche in Treatment: 10 Wound Status Wound Number: 5 Primary Diabetic Wound/Ulcer of the Lower Extremity Etiology: Wound Location: Left, Plantar Toe Great Wound Open Wounding Event: Gradually Appeared Status: Date Acquired: 12/02/2019 Comorbid Cataracts, Coronary Artery Disease, Hypertension, Weeks Of Treatment: 10 History: Myocardial Infarction, Peripheral Venous Disease, Type II Clustered Wound: No Diabetes, Osteoarthritis, Osteomyelitis, Neuropathy Photos Wound Measurements Length: (cm) 0.2 % Red Width: (cm) 0.2 % Red Depth: (cm) 0.4 Epith Area: (cm) 0.031 Tunn Volume: (cm) 0.013 Unde uction in Area: 98.8% uction in Volume: 99.4% elialization: None eling: No rmining: No Wound Description Classification: Grade 2 Foul Wound Margin: Thickened Sloug Exudate Amount: Medium Exudate Type: Serous Exudate Color: amber Odor After Cleansing: No h/Fibrino Yes  Wound Bed Granulation Amount: Medium (34-66%) Exposed Structure Granulation Quality: Pink Fascia Exposed: No Necrotic Amount: Medium (34-66%) Fat Layer (Subcutaneous Tissue) Exposed: Yes Necrotic Quality: Adherent Slough Tendon Exposed: No Muscle Exposed: No Joint Exposed: No Bone Exposed: No Treatment Notes Wound #5 (Left, Plantar Toe Great) Notes prisma, gauze and conform, peg assist shoe Electronic Signature(s) JETT, KULZER ([id number]) Signed: 02/26/2020 5:25:08 PM By: [name],  [name], RN, [name], [name] RN, [name] Entered By: [name], [name], RN, [name], [name] on 02/26/2020 16:01:39 Melinda [name] ([id number]) -------------------------------------------------------------------------------- Vitals Details Patient Name: Melinda [name] Date of Service: 02/26/2020 3:30 PM Medical Record Number: [id number] Patient Account Number: [id number] Date of Birth/Sex: 08/13/1955 (64 y.o. F) Treating RN: [name] Primary Care Shann Lewellyn: [name] Other Clinician: Referring Dejanay Wamboldt: [name] Treating Johnaton Sonneborn/Extender: Skipper Cliche in Treatment: 10 Vital Signs Time Taken: 15:47 Temperature (F): 98.5 Height (in): 69 Pulse (bpm): 80 Respiratory Rate (breaths/min): 16 Blood Pressure (mmHg): 138/67 Reference Range: 80 - 120 mg / dl Electronic Signature(s) Signed: 02/26/2020 4:39:22 PM By: Lorine Bears RCP, RRT, CHT Entered By: Lorine Bears on 02/26/2020 15:48:14

## 2020-03-02 ENCOUNTER — Ambulatory Visit (INDEPENDENT_AMBULATORY_CARE_PROVIDER_SITE_OTHER): Payer: Medicare HMO

## 2020-03-02 ENCOUNTER — Other Ambulatory Visit
Admission: RE | Admit: 2020-03-02 | Discharge: 2020-03-02 | Disposition: A | Discharge status: Home / Self Care | Payer: Medicare HMO | Attending: Family Medicine | Admitting: Family Medicine

## 2020-03-02 ENCOUNTER — Other Ambulatory Visit: Payer: Self-pay

## 2020-03-02 DIAGNOSIS — D649 Anemia, unspecified: Secondary | ICD-10-CM | POA: Insufficient documentation

## 2020-03-02 DIAGNOSIS — E559 Vitamin D deficiency, unspecified: Secondary | ICD-10-CM | POA: Diagnosis not present

## 2020-03-02 DIAGNOSIS — E1165 Type 2 diabetes mellitus with hyperglycemia: Secondary | ICD-10-CM | POA: Insufficient documentation

## 2020-03-02 DIAGNOSIS — N183 Chronic kidney disease, stage 3 unspecified: Secondary | ICD-10-CM | POA: Insufficient documentation

## 2020-03-02 DIAGNOSIS — I255 Ischemic cardiomyopathy: Secondary | ICD-10-CM | POA: Diagnosis not present

## 2020-03-02 DIAGNOSIS — E1122 Type 2 diabetes mellitus with diabetic chronic kidney disease: Secondary | ICD-10-CM | POA: Insufficient documentation

## 2020-03-02 DIAGNOSIS — I129 Hypertensive chronic kidney disease with stage 1 through stage 4 chronic kidney disease, or unspecified chronic kidney disease: Secondary | ICD-10-CM | POA: Diagnosis not present

## 2020-03-02 LAB — BASIC METABOLIC PANEL
Anion gap: 10 (ref 5–15)
BUN: 49 mg/dL — ABNORMAL HIGH (ref 8–23)
CO2: 22 mmol/L (ref 22–32)
Calcium: 9.2 mg/dL (ref 8.9–10.3)
Chloride: 105 mmol/L (ref 98–111)
Creatinine, Ser: 1.53 mg/dL — ABNORMAL HIGH (ref 0.44–1.00)
GFR, Estimated: 37 mL/min — ABNORMAL LOW (ref >60)
Glucose, Bld: 166 mg/dL — ABNORMAL HIGH (ref 70–99)
Potassium: 4.9 mmol/L (ref 3.5–5.1)
Sodium: 137 mmol/L (ref 135–145)

## 2020-03-02 LAB — ECHOCARDIOGRAM COMPLETE
AR max vel: 3.41 cm2
AV Area VTI: 4.01 cm2
AV Area mean vel: 3.47 cm2
AV Mean grad: 2 mmHg
AV Peak grad: 4.6 mmHg
Ao pk vel: 1.07 m/s
Area-P 1/2: 3.53 cm2
Calc EF: 38.9 %
S' Lateral: 4 cm
Single Plane A2C EF: 38.6 %
Single Plane A4C EF: 40 %

## 2020-03-02 LAB — PHOSPHORUS: Phosphorus: 2.9 mg/dL (ref 2.5–4.6)

## 2020-03-02 LAB — IRON AND TIBC
Iron: 23 ug/dL — ABNORMAL LOW (ref 28–170)
Saturation Ratios: 7 % — ABNORMAL LOW (ref 10.4–31.8)
TIBC: 342 ug/dL (ref 250–450)
UIBC: 319 ug/dL

## 2020-03-02 LAB — VITAMIN D 25 HYDROXY (VIT D DEFICIENCY, FRACTURES): Vit D, 25-Hydroxy: 36.71 ng/mL (ref 30–100)

## 2020-03-02 LAB — HEMOGLOBIN: Hemoglobin: 11 g/dL — ABNORMAL LOW (ref 12.0–15.0)

## 2020-03-03 LAB — PARATHYROID HORMONE, INTACT (NO CA): PTH: 15 pg/mL (ref 15–65)

## 2020-03-09 ENCOUNTER — Ambulatory Visit: Payer: Medicare HMO | Admitting: Physician Assistant

## 2020-03-10 ENCOUNTER — Ambulatory Visit: Payer: Medicare HMO | Admitting: Internal Medicine

## 2020-03-12 ENCOUNTER — Telehealth: Payer: Self-pay | Admitting: Dietician

## 2020-03-12 NOTE — Telephone Encounter (Signed)
Received call from patient to ask about advancing diet; she is currently consuming solid protein foods and protein shakes to supplement, and is reintroducing low-carb vegetables without difficulty so far. She reports consuming at least 64oz fluids daily, feels thirsty frequently. She is testing BGs at least 4 times daily, denies any low BGs, but did have one result of 374 and is unsure of the cause. She has not had any more high BGs since then. She has an appointment with her PCP next week and will discuss any possible medication adjustments.

## 2020-03-16 ENCOUNTER — Other Ambulatory Visit: Payer: Self-pay

## 2020-03-16 ENCOUNTER — Ambulatory Visit (INDEPENDENT_AMBULATORY_CARE_PROVIDER_SITE_OTHER): Payer: Medicare HMO | Admitting: Vascular Surgery

## 2020-03-16 ENCOUNTER — Ambulatory Visit (INDEPENDENT_AMBULATORY_CARE_PROVIDER_SITE_OTHER): Payer: Medicare HMO

## 2020-03-16 DIAGNOSIS — I7025 Atherosclerosis of native arteries of other extremities with ulceration: Secondary | ICD-10-CM | POA: Diagnosis not present

## 2020-03-17 ENCOUNTER — Other Ambulatory Visit: Payer: Self-pay | Admitting: Cardiovascular Disease

## 2020-03-18 ENCOUNTER — Telehealth: Payer: Self-pay | Admitting: Physician Assistant

## 2020-03-18 DIAGNOSIS — Z794 Long term (current) use of insulin: Secondary | ICD-10-CM

## 2020-03-18 DIAGNOSIS — E1159 Type 2 diabetes mellitus with other circulatory complications: Secondary | ICD-10-CM

## 2020-03-18 MED ORDER — PANTOPRAZOLE SODIUM 40 MG PO TBEC
40.0000 mg | DELAYED_RELEASE_TABLET | Freq: Every day | ORAL | 0 refills | Status: DC
Start: 2020-03-18 — End: 2020-07-01

## 2020-03-18 MED ORDER — FUROSEMIDE 20 MG PO TABS
20.0000 mg | ORAL_TABLET | Freq: Every day | ORAL | 0 refills | Status: DC | PRN
Start: 2020-03-18 — End: 2020-05-14

## 2020-03-18 MED ORDER — ATORVASTATIN CALCIUM 80 MG PO TABS: 80.0000 mg | ORAL_TABLET | Freq: Every day | ORAL | 0 refills | Status: DC

## 2020-03-18 NOTE — Telephone Encounter (Signed)
Rx request sent to pharmacy.  

## 2020-03-18 NOTE — Telephone Encounter (Signed)
  Patient Consent for Virtual Visit         Melinda [name] has provided verbal consent on 03/18/2020 for a virtual visit (video or telephone).   CONSENT FOR VIRTUAL VISIT FOR:  Melinda [name]  By participating in this virtual visit I agree to the following:  I hereby voluntarily request, consent and authorize [facility] and its employed or contracted physicians, physician assistants, nurse practitioners or other licensed health care professionals (the Practitioner), to provide me with telemedicine health care services (the "Services") as deemed necessary by the treating Practitioner. I acknowledge and consent to receive the Services by the Practitioner via telemedicine. I understand that the telemedicine visit will involve communicating with the Practitioner through live audiovisual communication technology and the disclosure of certain medical information by electronic transmission. I acknowledge that I have been given the opportunity to request an in-person assessment or other available alternative prior to the telemedicine visit and am voluntarily participating in the telemedicine visit.  I understand that I have the right to withhold or withdraw my consent to the use of telemedicine in the course of my care at any time, without affecting my right to future care or treatment, and that the Practitioner or I may terminate the telemedicine visit at any time. I understand that I have the right to inspect all information obtained and/or recorded in the course of the telemedicine visit and may receive copies of available information for a reasonable fee.  I understand that some of the potential risks of receiving the Services via telemedicine include:  Marland Kitchen Delay or interruption in medical evaluation due to technological equipment failure or disruption; . Information transmitted may not be sufficient (e.g. poor resolution of images) to allow for appropriate medical decision making by the  Practitioner; and/or  . In rare instances, security protocols could fail, causing a breach of personal health information.  Furthermore, I acknowledge that it is my responsibility to provide information about my medical history, conditions and care that is complete and accurate to the best of my ability. I acknowledge that Practitioner's advice, recommendations, and/or decision may be based on factors not within their control, such as incomplete or inaccurate data provided by me or distortions of diagnostic images or specimens that may result from electronic transmissions. I understand that the practice of medicine is not an exact science and that Practitioner makes no warranties or guarantees regarding treatment outcomes. I acknowledge that a copy of this consent can be made available to me via my patient portal ([facility]), or I can request a printed copy by calling the office of [facility].    I understand that my insurance will be billed for this visit.   I have read or had this consent read to me. . I understand the contents of this consent, which adequately explains the benefits and risks of the Services being provided via telemedicine.  . I have been provided ample opportunity to ask questions regarding this consent and the Services and have had my questions answered to my satisfaction. . I give my informed consent for the services to be provided through the use of telemedicine in my medical care

## 2020-03-18 NOTE — Telephone Encounter (Signed)
Received fax from [facility] requesting refills for Furosemide, Atorvastatin, Pantoprazole, and Metoprolol. Metoprolol was sent into [facility] yesterday, 11/17. All other requests sent in today.

## 2020-03-23 ENCOUNTER — Encounter: Payer: Self-pay | Admitting: Physician Assistant

## 2020-03-23 ENCOUNTER — Telehealth (INDEPENDENT_AMBULATORY_CARE_PROVIDER_SITE_OTHER): Payer: Medicare HMO | Admitting: Physician Assistant

## 2020-03-23 ENCOUNTER — Other Ambulatory Visit: Payer: Self-pay

## 2020-03-23 VITALS — BP 104/83 | Ht 68.5 in | Wt 271.0 lb

## 2020-03-23 DIAGNOSIS — Z951 Presence of aortocoronary bypass graft: Secondary | ICD-10-CM

## 2020-03-23 DIAGNOSIS — Z720 Tobacco use: Secondary | ICD-10-CM

## 2020-03-23 DIAGNOSIS — I739 Peripheral vascular disease, unspecified: Secondary | ICD-10-CM

## 2020-03-23 DIAGNOSIS — E1159 Type 2 diabetes mellitus with other circulatory complications: Secondary | ICD-10-CM

## 2020-03-23 DIAGNOSIS — I10 Essential (primary) hypertension: Secondary | ICD-10-CM

## 2020-03-23 DIAGNOSIS — I25118 Atherosclerotic heart disease of native coronary artery with other forms of angina pectoris: Secondary | ICD-10-CM | POA: Diagnosis not present

## 2020-03-23 DIAGNOSIS — I5020 Unspecified systolic (congestive) heart failure: Secondary | ICD-10-CM | POA: Diagnosis not present

## 2020-03-23 DIAGNOSIS — Z794 Long term (current) use of insulin: Secondary | ICD-10-CM

## 2020-03-23 NOTE — Patient Instructions (Signed)
Medication Instructions:   Your physician recommends that you continue on your current medications as directed. Please refer to the Current Medication list given to you today.  *If you need a refill on your cardiac medications before your next appointment, please call your pharmacy*   Lab Work: None Ordered If you have labs (blood work) drawn today and your tests are completely normal, you will receive your results only by: Marland Kitchen MyChart Message (if you have MyChart) OR . A paper copy in the mail If you have any lab test that is abnormal or we need to change your treatment, we will call you to review the results.   Testing/Procedures:  1.  Melinda [name]      Your caregiver has ordered a Stress Test with nuclear imaging. The purpose of this test is to evaluate the blood supply to your heart muscle. This procedure is referred to as a "Non-Invasive Stress Test." This is because other than having an IV started in your vein, nothing is inserted or "invades" your body. Cardiac stress tests are done to find areas of poor blood flow to the heart by determining the extent of coronary artery disease (CAD). Some patients exercise on a treadmill, which naturally increases the blood flow to your heart, while others who are  unable to walk on a treadmill due to physical limitations have a pharmacologic/chemical stress agent called Lexiscan . This medicine will mimic walking on a treadmill by temporarily increasing your coronary blood flow.      PLEASE REPORT TO [facility] ENTRANCE   THE VOLUNTEERS AT THE FIRST DESK WILL DIRECT YOU WHERE TO GO     *Please note: these test may take anywhere between 2-4 hours to complete        Date of Procedure:_____________________________________   Arrival Time for Procedure:______________________________    PLEASE NOTIFY THE OFFICE AT LEAST 24 HOURS IN ADVANCE IF YOU ARE UNABLE TO KEEP YOUR APPOINTMENT.  [place] 24 HOURS IN ADVANCE IF YOU ARE UNABLE TO KEEP YOUR APPOINTMENT. [phone]         How to prepare for your Myoview test:         _XX___:  Hold diabetes medication the morning of procedure: insulin aspart (NOVOLOG)  _XX___:  Hold betablocker(s) night before procedure and morning of procedure:   metoprolol tartrate (LOPRESSOR)    _XX__:  Hold other medications as follows:   furosemide (LASIX)    1. Do not eat or drink after midnight  2. No caffeine for 24 hours prior to test  3. No smoking 24 hours prior to test.  4. Unless instructed otherwise, Take your medication with a small sips of water.    5.         Ladies, please do not wear dresses. Skirts or pants are appropriate. Please wear a short sleeve shirt.  6. No perfume, cologne or lotion.  7. Wear comfortable walking shoes. No heels!    Follow-Up: At [facility], you and your health needs are our priority.  As part of our continuing mission to provide you with exceptional heart care, we have created designated Provider Care Teams.  These Care Teams include your primary Cardiologist (physician) and Advanced Practice Providers (APPs -  Physician Assistants and Nurse Practitioners) who all work together to provide you with the care you need, when you need it.  We recommend signing up for the patient portal called "MyChart".  Sign up  information is provided on this After Visit Summary.  MyChart is used to connect with patients for Virtual Visits (Telemedicine).  Patients are able to view lab/test results, encounter notes, upcoming appointments, etc.  Non-urgent messages can be sent to your provider as well.   To learn more about what you can do with MyChart, go to [email or web address].    Your next appointment:   Follow up after Myoview   The format for your next appointment:   In Person  Provider:   You may see Ida Rogue, MD or one of the following Advanced Practice Providers on your designated Care Team:     Murray Hodgkins, NP  Christell Faith, [name]  Marrianne Mood, [name]  Cadence Samburg, [place]  Laurann Montana, NP    Other Instructions

## 2020-03-23 NOTE — Progress Notes (Signed)
Virtual Visit via Telephone Note   This visit type was conducted due to national recommendations for restrictions regarding the COVID-19 Pandemic (e.g. social distancing) in an effort to limit this patient's exposure and mitigate transmission in our community.  Due to her co-morbid illnesses, this patient is at least at moderate risk for complications without adequate follow up.  This format is felt to be most appropriate for this patient at this time.  The patient did not have access to video technology/had technical difficulties with video requiring transitioning to audio format only (telephone).  All issues noted in this document were discussed and addressed.  No physical exam could be performed with this format.  Please refer to the patient's chart for her  consent to telehealth for [facility].   Date:  03/23/2020   ID:  Melinda [name], DOB Sep 23, 1955, MRN [id number]  Patient Location: Home Provider Location: Office/Clinic  PCP:  Donnie Coffin, MD  Cardiologist:  Ida Rogue, MD  Electrophysiologist:  None   Evaluation Performed:  Follow-Up Visit  Chief Complaint:  64 y.o. female with history of CAD s/p CABG x4 (2016), HFrEF/ ICM, hypertension, hyperlipidemia, IDDM, tobacco use, PAD s/p repeat interventions and followed by VVS, CKD followed by CCK, obesity, anxiety, and who presents today for follow-up and review of echo.  Chief Complaint  Patient presents with  . Follow-up    Pt. c/o shortness of breath with exertion and rapid heart beats. Meds reviewed by the pt. verbally.      History of Present Illness:    Melinda [name] is a 64 y.o. female with PMH as above.   She has a history of CAD s/p CABG, smoking (current), PAD, and diabetes on insulin.  She also has a history of arthritis of chest, arms, and shoulders.  She was admitted to [facility] 2/1 to 06/03/2014 for non-ST elevation myocardial infarction.  She underwent cardiac cath on 2/3 that showed severe  three-vessel disease.  She was transferred to [facility] from 2/3-2/13 for four-vessel CABG (LIMA to LAD, SVG to diagonal, SVG to OM, and SVG to PDA).  She has history of PAD with long discussion regarding her PAD with her primary cardiologist at most recent visit.  She underwent left SFA stenting and PTA of the left popliteal artery in 2018.  In 2019, she had a toe amputation due to PAD with ulceration and gangrene of left foot per review of previous progress notes.  In August 2020, she underwent percutaneous transluminal angioplasty of the anterior tibial artery with 3 mm diameter by 10 cm length angioplasty balloon.  Percutaneous transluminal angioplasty of the left distal SFA was performed with 5 mm x 8 cm length tonics, drug-coated angioplasty balloon.  Stent placement was performed of the distal left SFA with a 6 mm x 5 cm length stent for greater than 50% residual stenosis after angioplasty.  She underwent repeat intervention 1 week later.  At follow-up, she denied claudication symptoms in her legs.  She is followed by nephrology.  It has been noted that hemoglobin A1c has improved with insulin increased.  However, at her most recent visit, she was noted to not be eating as well with increase in insulin.  Weight was 314 in the last year and up from 270 in 2019.  She was seen in clinic in 2020 and reported doing reasonably well with chronic right lower extremity claudication after walking about 25 to 50 yards.  She reported back pain over the last  few months, which was her largest limiting factor to ambulation at that point.  She occasionally noted left lower extremity pain, especially if laying on her left side but no significant claudication.  She continued to smoke, though she had considered Chantix previously.  She was on insulin therapy for her diabetes.  Recommendation was for follow-up ABIs.  She was seen by her primary cardiologist, Dr. Rockey Situ, 07/04/2019.  She was smoking 14  cigarettes a day and did not want Chantix.  BP was noted to be stable.  She appears euvolemic on exam with recommendation to consider repeat echo at follow-up visit.  Smoking cessation was stressed given history of PAD and CAD as above.  She denied any anginal symptoms.  Ongoing dietary changes were discussed.  Seen 02/03/2020 for preop evaluation prior to gastric bypass surgery, scheduled 02/09/2020. She reported quitting smoking with Chantix; however, she had restarted smoking during the stressful experience of COVID-19. She wanted to quit again, which was discussed at length. She noted some dizziness, specifically that morning and while using the restroom. She was not feeling like herself and wondered if it was due to low blood sugar. She reported an episode of BRBPR that occurred in the setting of constipation and occurred approximately 3 weeks prior to her appointment. She reported chronic lower extremity pain and limitations in ambulation due to lower extremity. The age is and symptoms consistent with claudication. She noted that she would often go to the kitchen and felt as if she needed to rest on the way back to the living room. She was able to climb a flight of stairs without shortness of breath but again limited by her lower extremity pain. She noted a desire for physical therapy so that she would be able to continue to move without pain.  Her case was discussed with her primary cardiologist same day with recommendation to proceed to surgery without first obtaining an echo but to then update her echocardiogram to ensure stability. It was felt that obtaining an echo before surgery would likely not change management, given her lack of active/exertional symptoms. It was also noted Lexiscan was not an ideal study due to likely reduced image quality that would not change management.  On 03/02/2020, she obtained an updated echo that showed EF 35-40, reduced from her previous EF. LV demonstrated wall motion  abnormalities. Grade 1 diastolic dysfunction was noted. There was moderate hypokinesis of the left ventricle, basal-mid inferior wall, and inferior wall. RV systolic function was mildly reduced. Mild dilation of left and right atrium were noted. Trivial MR and moderate mitral annular calcification were noted. Mild to moderate aortic valve sclerosis/calcification was noted without evidence of aortic stenosis. Right atrial pressure was estimated at 8 mmHg.  She reports that 2 days after obtaining her echocardiogram, she tested positive for COVID-19. She did not require admission and had a relatively uncomplicated recovery from COVID-19. She reports feeling sick for a week with high fever and lost of taste and smell. She experience shortness of breath with minimal activity.  On 03/16/2020, she underwent bilateral lower extremity studies that showed moderate right lower extremity arterial disease with abnormal right toe brachial index and mild left lower extremity arterial disease with right (typo? L?) TBI decreased as compared to the previous exam on 10/28/2019. The studies were performed by vascular surgery.  Today, 03/23/2020, she presents via telemedicine for discussion of her echo as above. Given her history of CAD and risk factors of current tobacco use and PAD, recommendation  was to proceed with cardiac catheterization, which was discussed at length; however, significant hesitation was noted throughout the phone call. She reports significant dyspnea with minimal exertion. She is unable to climb a flight of steps. She experiences shortness of breath with vacuuming and most active household activities. She denies any chest pain at rest or with exertion. She denies any symptoms of volume overload. She attributes most of her dyspnea to deconditioning. She does not have a regular workout routine. She continues to smoke. No signs or symptoms of bleeding. She reports medication compliance.  The patient does not  have symptoms concerning for COVID-19 infection (fever, chills, cough, or new shortness of breath). She already has known COVID-19 diagnosis, recently recovered.  Past Medical History:  Diagnosis Date  . Anxiety   . Coronary artery disease    a. 06/2014 NSTEMI s/p CABG x 4 (LIMA to LAD, VG to Diag, VG to OM, VG to PDA).  . Depression   . Diabetic neuropathy ([place])   . GERD (gastroesophageal reflux disease)   . HTN (hypertension)   . Hyperlipidemia LDL goal <70   . IDDM (insulin dependent diabetes mellitus)   . Ischemic cardiomyopathy    a. 06/2014 Echo: EF 40-45%, HK of entire inferolateral and inferior myocardium c/w infarct of RCA/LCx, GR2DD, mild MR  . Myocardial infarction ([place]) 2016  . OA (osteoarthritis) of knee   . Obesity   . Osteoarthritis   . Osteomyelitis ([place])   . Peripheral neuropathy   . Peripheral vascular disease ([place])    a. 09/2016 Periph Angio: CTO R popliteal, CTO L prox/mid SFA->Med Rx; b. 05/2017 PTA: LSFA (Viabahn covered stent x 2), DEB to L Post Tibial; c. 06/2017 ABI: R 0.44, L 1.05.  . Tobacco abuse    Past Surgical History:  Procedure Laterality Date  . ABDOMINAL AORTOGRAM W/LOWER EXTREMITY N/A 10/27/2016   Procedure: Abdominal Aortogram w/Lower Extremity;  Surgeon: Nelva Bush, MD;  Location: [place] [facility];  Service: Cardiovascular;  Laterality: N/A;  . AMPUTATION TOE Left 10/21/2015   Procedure: AMPUTATION TOE;  Surgeon: Sharlotte Alamo, DPM;  Location: [facility];  Service: Podiatry;  Laterality: Left;  . AMPUTATION TOE Left 05/15/2017   Procedure: AMPUTATION LEFT GREAT TOE;  Surgeon: Sharlotte Alamo, DPM;  Location: [facility];  Service: Podiatry;  Laterality: Left;  . AORTIC VALVE REPLACEMENT (AVR)/CORONARY ARTERY BYPASS GRAFTING (CABG)    . BREAST BIOPSY    . CARDIAC CATHETERIZATION  06/2014   95% stenosis mLAD, occlusion ostial OM1, 70% stenosis LCx, 95% stenosis mRCA, EF 45%.  . CORONARY ARTERY BYPASS GRAFT N/A 06/08/2014   Procedure: CORONARY ARTERY BYPASS  GRAFTING (CABG);  Surgeon: Grace Isaac, MD;  Location: [place];  Service: Open Heart Surgery;  Laterality: N/A;  Times 4 using left internal mammary artery to LAD artery and endoscopically harvested bilateral saphenous vein to Obtuse Marginal, Diagonal and Posterior Descending coronary arteries.  . CT ABD W & PELVIS WO CM  06/2014   nl liver, gallbladder, spleen, mild diverticular changes, no bowel wall inflammation, appendix nl, no hernia, no other sig abnormalities  . GASTRIC ROUX-EN-Y N/A 02/09/2020   Procedure: LAPAROSCOPIC ROUX-EN-Y GASTRIC BYPASS WITH UPPER ENDOSCOPY;  Surgeon: Johnathan Hausen, MD;  Location: [facility];  Service: General;  Laterality: N/A;  . LOWER EXTREMITY ANGIOGRAPHY Left 05/23/2017   Procedure: LOWER EXTREMITY ANGIOGRAPHY;  Surgeon: Algernon Huxley, MD;  Location: [place] [facility];  Service: Cardiovascular;  Laterality: Left;  . LOWER EXTREMITY ANGIOGRAPHY Left 05/28/2017  Procedure: LOWER EXTREMITY ANGIOGRAPHY;  Surgeon: Algernon Huxley, MD;  Location: [place] [facility];  Service: Cardiovascular;  Laterality: Left;  . LOWER EXTREMITY ANGIOGRAPHY Left 12/16/2018   Procedure: LOWER EXTREMITY ANGIOGRAPHY;  Surgeon: Algernon Huxley, MD;  Location: [place] [facility];  Service: Cardiovascular;  Laterality: Left;  . LOWER EXTREMITY ANGIOGRAPHY Right 12/23/2018   Procedure: LOWER EXTREMITY ANGIOGRAPHY;  Surgeon: Algernon Huxley, MD;  Location: [place] [facility];  Service: Cardiovascular;  Laterality: Right;  . TEE WITHOUT CARDIOVERSION N/A 06/08/2014   Procedure: TRANSESOPHAGEAL ECHOCARDIOGRAM (TEE);  Surgeon: Grace Isaac, MD;  Location: [place];  Service: Open Heart Surgery;  Laterality: N/A;  . TOE AMPUTATION Right 12/2011   rt middle toe  . UPPER GI ENDOSCOPY N/A 02/09/2020   Procedure: UPPER GI ENDOSCOPY;  Surgeon: Johnathan Hausen, MD;  Location: [facility];  Service: General;  Laterality: N/A;  . US ECHOCARDIOGRAPHY  06/2014   EF 50-55%, HK of inf/post/inferolat walls, Ao  sclerosis     Current Meds  Medication Sig  . ACCU-CHEK AVIVA PLUS test strip 1 each 3 (three) times daily.  . Accu-Chek Softclix Lancets lancets 1 each 3 (three) times daily.  . Alcohol Swabs (B-D SINGLE USE SWABS REGULAR) PADS Apply topically.  Marland Kitchen amLODipine (NORVASC) 5 MG tablet Take 5 mg by mouth daily.   Marland Kitchen atorvastatin (LIPITOR) 80 MG tablet Take 1 tablet (80 mg total) by mouth daily at 6 PM.  . clobetasol (TEMOVATE) 0.05 % external solution Patient to mix entire bottle with 1 jar of CeraVe and apply twice a day x 1 month then as needed. Avoid face, groin, underarms.  . clobetasol (TEMOVATE) 0.05 % external solution Apply 1 application topically 2 (two) times daily as needed.  . clopidogrel (PLAVIX) 75 MG tablet Take 1 tablet (75 mg total) by mouth daily.  . DROPLET PEN NEEDLES 31G X 6 MM MISC   . DULoxetine (CYMBALTA) 60 MG capsule Take 60 mg by mouth daily.   . Eflornithine HCl 13.9 % cream Apply to upper lip, chin and neck twice a day.  . Eflornithine HCl 13.9 % cream Apply topically 2 (two) times daily with a meal.  . Emollient (CERAVE EX) Apply 1 application topically in the morning and at bedtime. Mixed with clobetasol  . ferrous sulfate 325 (65 FE) MG tablet Take 325 mg by mouth daily.  . furosemide (LASIX) 20 MG tablet Take 1 tablet (20 mg total) by mouth daily as needed (fluid retention.).  Marland Kitchen insulin aspart (NOVOLOG) 100 UNIT/ML injection Inject 0-20 Units into the skin every 4 (four) hours.  . lidocaine (LIDODERM) 5 % Place 1 patch onto the skin daily as needed (PAIN.).   Marland Kitchen lisinopril (PRINIVIL,ZESTRIL) 5 MG tablet Take 1 tablet (5 mg total) by mouth daily.  . metoprolol tartrate (LOPRESSOR) 25 MG tablet TAKE 1/2 TABLET TWICE DAILY  (BETA  BLOCKER)  . Multiple Vitamins-Minerals (MULTIVITAMIN WITH MINERALS) tablet Take 2 tablets by mouth daily. Taking 2 tablets once daily  . NOVOLOG FLEXPEN 100 UNIT/ML FlexPen Take according to previously discusses sliding scale.  *HOLD FOR LOW  BLOOD GLUCOSE READING*  . ondansetron (ZOFRAN-ODT) 4 MG disintegrating tablet Take 1 tablet (4 mg total) by mouth every 6 (six) hours as needed for nausea or vomiting.  Marland Kitchen oxybutynin (DITROPAN) 5 MG tablet Take 5 mg by mouth 2 (two) times daily.   . pantoprazole (PROTONIX) 40 MG tablet Take 1 tablet (40 mg total) by mouth daily.  . potassium chloride  SA (KLOR-CON) 20 MEQ tablet TAKE 1 TABLET (20 MEQ TOTAL) BY MOUTH DAILY AS NEEDED.  . pregabalin (LYRICA) 100 MG capsule Take 100 mg by mouth 3 (three) times daily.   Marland Kitchen tiZANidine (ZANAFLEX) 4 MG tablet Take 4 mg by mouth 3 (three) times daily.     Allergies:   Patient has no known allergies.   Social History   Tobacco Use  . Smoking status: Current Some Day Smoker    Packs/day: 0.50    Years: 40.00    Pack years: 20.00    Types: Cigarettes  . Smokeless tobacco: Never Used  Vaping Use  . Vaping Use: Never used  Substance Use Topics  . Alcohol use: No    Alcohol/week: 0.0 standard drinks  . Drug use: Not Currently    Comment: crack     Family Hx: The patient's family history includes Asthma in an other family member; CAD in her father and mother; Diabetes in her father and mother; Hypertension in her mother; Sickle cell trait in an other family member.  ROS:   Please see the history of present illness.    She denies chest pain, palpitations, pnd, orthopnea, n, v, dizziness, syncope, edema, weight gain, or early satiety. She reports dyspnea with minimal activity and ongoing bilateral lower extremity paresthesias and claudication symptoms which limit her ability to ambulate. All other systems reviewed and are negative.  Objective:    Vital Signs:  BP 104/83   Ht 5' 8.5" (1.74 m)   Wt 271 lb (122.9 kg)   BMI 40.61 kg/m    VITAL SIGNS:  reviewed  Accessory clinical findings reviewed:    EKG:  No ECG reviewed.  Previous vitals reviewed today:    Temp Readings from Last 3 Encounters:  02/11/20 98.3 F (36.8 C) (Oral)   02/02/20 98.6 F (37 C) (Oral)  12/23/18 97.6 F (36.4 C)   BP Readings from Last 3 Encounters:  03/23/20 104/83  02/11/20 127/66  02/03/20 128/80   Pulse Readings from Last 3 Encounters:  02/11/20 100  02/03/20 87  02/02/20 (!) 8    Wt Readings from Last 3 Encounters:  03/23/20 271 lb (122.9 kg)  02/20/20 280 lb 3.2 oz (127.1 kg)  02/09/20 293 lb 6.4 oz (133.1 kg)     Labs reviewed today:      Lab Results  Component Value Date   WBC 10.0 02/11/2020   HGB 11.0 (L) 03/02/2020   HCT 35.1 (L) 02/11/2020   MCV 70.6 (L) 02/11/2020   PLT 334 02/11/2020   Lab Results  Component Value Date   CREATININE 1.53 (H) 03/02/2020   BUN 49 (H) 03/02/2020   NA 137 03/02/2020   K 4.9 03/02/2020   CL 105 03/02/2020   CO2 22 03/02/2020   Lab Results  Component Value Date   ALT 12 02/02/2020   AST 11 (L) 02/02/2020   ALKPHOS 104 02/02/2020   BILITOT 0.5 02/02/2020   Lab Results  Component Value Date   CHOL 92 03/14/2018   HDL 39 (L) 03/14/2018   LDLCALC 39 03/14/2018   TRIG 71 03/14/2018   CHOLHDL 2.4 03/14/2018    Lab Results  Component Value Date   HGBA1C 6.7 (H) 02/02/2020   Lab Results  Component Value Date   TSH 0.545 03/14/2018     Prior CV Studies reviewed today:    The following studies were reviewed today:  Echo 03/02/20 1. Left ventricular ejection fraction, by estimation, is 35 to 40%. The  left ventricle has moderately decreased function. The left ventricle  demonstrates regional wall motion abnormalities (see scoring  diagram/findings for description). Left ventricular  diastolic parameters are consistent with Grade I diastolic dysfunction  (impaired relaxation). There is moderate hypokinesis of the left  ventricular, basal-mid inferior wall and inferolateral wall.  2. Right ventricular systolic function is mildly reduced. The right  ventricular size is normal. Tricuspid regurgitation signal is inadequate  for assessing PA pressure.  3.  Left atrial size was mildly dilated.  4. Right atrial size was mildly dilated.  5. The mitral valve is normal in structure. Trivial mitral valve  regurgitation. No evidence of mitral stenosis. Moderate mitral annular  calcification.  6. The aortic valve is tricuspid. There is mild calcification of the  aortic valve. There is mild thickening of the aortic valve. Aortic valve  regurgitation is not visualized. Mild to moderate aortic valve  sclerosis/calcification is present, without any  evidence of aortic stenosis.  7. The inferior vena cava is normal in size with <50% respiratory  variability, suggesting right atrial pressure of 8 mmHg.    03/16/20 LE study Right: Resting right ankle-brachial index indicates moderate right lower  extremity arterial disease. The right toe-brachial index is abnormal.   Right TBI appears decreased as compared to the previous exam on 10/28/2019.  Left: Resting left ankle-brachial index indicates mild left lower  extremity arterial disease.  ABIs 10/28/2019 Summary:  Right: Monophasic flow throughout suggesting inflow disease. Moderate  atherosclerosis. ABI suggests moderate disease.  Left: Biphasic flow throughout. Moderate atherosclerosis. NL abi.   Lower extremity arterial study 10/28/2019 Summary:  Right: Monophasic flow throughout suggesting inflow disease. Moderate  atherosclerosis. ABI suggests moderate disease.  Left: Biphasic flow throughout. Moderate atherosclerosis. NL abi.   Abdominal aortogram with lower extremity 10/27/2016 Conclusions: 1. Significant bilateral lower extremity PAD, including chronic total occlusion of the right popliteal artery and chronic total occlusion of the left proximal and mid superficial femoral artery. Recommendations: 1. Aggressive medical therapy and referral to supervise walking program. 2. If patient continues to have lifestyle limiting claudication, revascularization of the left SFA chronic total  occlusion could not be attempted. Right popliteal CTO is more challenging to revascularize. Study will be reviewed with the PV team regarding potential for endovascular versus surgical revascularization. 3. Return to clinic next week for basic metabolic panel to ensure stable renal function and electrolytes. Nelva Bush, MD [facility]  Echo 2016 - Left ventricle: The cavity size was normal. There was moderate  concentric hypertrophy. Systolic function was mildly to  moderately reduced. The estimated ejection fraction was in the  range of 40% to 45%. Severe hypokinesis of the  entireinferolateral and inferior myocardium; consistent with  infarction in the distribution of the right coronary or left  circumflex coronary artery. Features are consistent with a  pseudonormal left ventricular filling pattern, with concomitant  abnormal relaxation and increased filling pressure (grade 2  diastolic dysfunction).  - Mitral valve: There was mild regurgitation.    ASSESSMENT & PLAN:    Coronary artery disease involving native coronary artery of native heart without angina S/p CABG x4 --As above, she has experienced significant dyspnea with minimal activity; however, this is in the setting of known recent COVID-19 infection. We discussed at length today that her dyspnea is concerning for anginal equivalent. She denies any chest pain. Given her most recent echo shows reduced EF and wall motion abnormalities, cardiac catheterization was recommended with patient preference to defer. We discussed at length  that, given her risk factors for cardiac disease/coronary insufficiency include current smoker/tobacco use / DM2/ and known history of PAD and CAD, catheterization was recommended. In addition, Lexiscan Myoview would likely not produce quality images. We also discussed that, if Lexiscan Myoview is ruled low risk, cardiac catheterization would likely still be recommended in the  setting of her known disease. She expressed her frustration that she may require further intervention and preference that more information be obtained before proceeding with cardiac catheterization. After long discussion, we have agreed to proceed with [facility] for now. In the meantime, I will [place] her primary cardiologist. If possible, I would recommend that she be seen in the office with her primary cardiologist, so that she is able to further discuss the recommendation to proceed with cardiac catheterization over MPI. Continue ongoing risk factor modification, including glycemic control.  Continue current Lipitor.   Continue DAPT. Discussed smoking cessation.  Essential hypertension --BP today controlled.  She will continue to monitor her pressure going forward.  Discussed low-salt diet and fluid restriction. No medication changes at this time.  ICM/HFrEF --Reports dyspnea with minimal activity. Denies any further signs or symptoms of volume overload over the telephone today. As above, it is difficult to tease out her dyspnea in the setting of recent COVID-19 infection. Updated echo as above. Given her reduced EF, cardiac catheterization is recommended with patient preference to defer and instead proceed with MPI. Recommend that she instead discuss this with her primary cardiologist, as my preference would be for her to proceed with cardiac catheterization. We will loop in Dr. Rockey Situ. Further recommendations at that time.  Claudication/PAD --Recent lower extremity vascular studies as above by vascular surgery with reduced left side ABIs compared to prior study on 10/28/2019. Recommend proceed with recommendations provided by vascular surgery. She is monitored closely for PAD with h/o recent intervention to her lower extremities.  Encouraged smoking cessation.   Continue cholesterol control. Encouraged updated cholesterol labs.  DM2 --Stressed glycemic control.  Ongoing dietary changes.   Continue to follow with PCP.  Continue current insulin.  Tobacco use --As above in HPI, she was able to quit smoking on Chantix; however, during COVID-19, she restarted smoking and is smoking 14 cigarettes/day as previously noted.  Discussed various strategies for quitting at her previous visit with patient preference to restart Chantix once surgery feels this is safe to restart.  She will contact the office once cleared for Chantix.  Disposition:  Scheduled for [employer]. Will loop in Dr. Rockey Situ.  COVID-19 Education: The signs and symptoms of COVID-19 were discussed with the patient and how to seek care for testing (follow up with PCP or arrange E-visit).  The importance of social distancing was discussed today.  Time:   Today, I have spent 30 minutes with the patient with telehealth technology discussing the above problems.     Medication Adjustments/Labs and Tests Ordered: Current medicines are reviewed at length with the patient today.  Concerns regarding medicines are outlined above.    Disposition: In Person with Dr. Rockey Situ or after MPI.  Signed, Arvil Chaco, [name]  03/23/2020  [facility]

## 2020-03-24 ENCOUNTER — Telehealth: Payer: Self-pay

## 2020-03-24 NOTE — Telephone Encounter (Signed)
Called patient and left a detailed VM per DPR on file with instructions for Myoview ordered during her tele visit with Jacquelyn yesterday.  Informed patient that I would be mailing her AVS with instructions on it as well and encouraged her to call back with any questions or concerns.

## 2020-03-24 NOTE — Telephone Encounter (Signed)
Spoke with patient and she confirmed she got the VM with instructions and verbalized understanding.

## 2020-03-29 ENCOUNTER — Other Ambulatory Visit: Payer: Self-pay

## 2020-03-29 ENCOUNTER — Encounter: Payer: Medicare HMO | Attending: Physician Assistant | Admitting: Physician Assistant

## 2020-03-29 DIAGNOSIS — Z794 Long term (current) use of insulin: Secondary | ICD-10-CM | POA: Diagnosis not present

## 2020-03-29 DIAGNOSIS — E11621 Type 2 diabetes mellitus with foot ulcer: Secondary | ICD-10-CM | POA: Diagnosis present

## 2020-03-29 DIAGNOSIS — I10 Essential (primary) hypertension: Secondary | ICD-10-CM | POA: Diagnosis not present

## 2020-03-29 DIAGNOSIS — Z951 Presence of aortocoronary bypass graft: Secondary | ICD-10-CM | POA: Diagnosis not present

## 2020-03-29 DIAGNOSIS — E1151 Type 2 diabetes mellitus with diabetic peripheral angiopathy without gangrene: Secondary | ICD-10-CM | POA: Insufficient documentation

## 2020-03-29 DIAGNOSIS — Z89422 Acquired absence of other left toe(s): Secondary | ICD-10-CM | POA: Insufficient documentation

## 2020-03-29 DIAGNOSIS — E1142 Type 2 diabetes mellitus with diabetic polyneuropathy: Secondary | ICD-10-CM | POA: Insufficient documentation

## 2020-03-29 DIAGNOSIS — Z89411 Acquired absence of right great toe: Secondary | ICD-10-CM | POA: Diagnosis not present

## 2020-03-29 DIAGNOSIS — I2510 Atherosclerotic heart disease of native coronary artery without angina pectoris: Secondary | ICD-10-CM | POA: Insufficient documentation

## 2020-03-29 DIAGNOSIS — Z89421 Acquired absence of other right toe(s): Secondary | ICD-10-CM | POA: Diagnosis not present

## 2020-03-29 DIAGNOSIS — L97522 Non-pressure chronic ulcer of other part of left foot with fat layer exposed: Secondary | ICD-10-CM | POA: Insufficient documentation

## 2020-03-29 DIAGNOSIS — I252 Old myocardial infarction: Secondary | ICD-10-CM | POA: Diagnosis not present

## 2020-03-29 DIAGNOSIS — L84 Corns and callosities: Secondary | ICD-10-CM | POA: Diagnosis not present
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
## 2020-03-29 NOTE — Progress Notes (Addendum)
MCKENNAH, KRETCHMER ([id number]) Visit Report for 03/29/2020 Arrival Information Details Patient Name: Melinda [name], Melinda [name] Date of Service: 03/29/2020 2:15 PM Medical Record Number: [id number] Patient Account Number: [id number] Date of Birth/Sex: [birth year] (64 y.o. F) Treating RN: Dolan Amen Primary Care Hallel Denherder: [name] Other Clinician: Referring Harmonii Karle: [name] Treating Corderro Koloski/Extender: Skipper Cliche in Treatment: 14 Visit Information History Since Last Visit Pain Present Now: No Patient Arrived: Ambulatory Arrival Time: 13:57 Accompanied By: self Transfer Assistance: None Patient Requires Transmission-Based No Precautions: Patient Has Alerts: Yes Patient Alerts: 10/28/19 ABI: R 1.05 L .60 Electronic Signature(s) Signed: 03/29/2020 3:30:56 PM By: Georges Mouse, Minus Breeding Entered By: Georges Mouse, Minus Breeding on 03/29/2020 13:59:14 Melinda [name] ([id number]) -------------------------------------------------------------------------------- Encounter Discharge Information Details Patient Name: Melinda [name] Date of Service: 03/29/2020 2:15 PM Medical Record Number: [id number] Patient Account Number: [id number] Date of Birth/Sex: 04/27/1956 (64 y.o. F) Treating RN: Dolan Amen Primary Care Keatyn Luck: [name] Other Clinician: Referring Tell Rozelle: [name] Treating Airam Heidecker/Extender: Skipper Cliche in Treatment: 14 Encounter Discharge Information Items Post Procedure Vitals Discharge Condition: Stable Temperature (F): 98 Ambulatory Status: Ambulatory Pulse (bpm): 135 Discharge Destination: Home Respiratory Rate (breaths/min): 20 Transportation: Private Auto Blood Pressure (mmHg): 107/65 Accompanied By: self Schedule Follow-up Appointment: Yes Clinical Summary of Care: Electronic Signature(s) Signed: 03/29/2020 3:30:56 PM By: Georges Mouse, Minus Breeding Entered By: Georges Mouse, Minus Breeding on 03/29/2020 14:36:09 Melinda [name]  ([id number]) -------------------------------------------------------------------------------- Lower Extremity Assessment Details Patient Name: Melinda [name] Date of Service: 03/29/2020 2:15 PM Medical Record Number: [id number] Patient Account Number: [id number] Date of Birth/Sex: 15-Dec-1955 (64 y.o. F) Treating RN: Dolan Amen Primary Care Hermila Millis: [name] Other Clinician: Referring Tatyanna Cronk: [name] Treating Raysean Graumann/Extender: Jeri Cos Weeks in Treatment: 14 Edema Assessment Assessed: [Left: Yes] [Right: No] Edema: [Left: N] [Right: o] Vascular Assessment Pulses: Dorsalis Pedis Palpable: [Left:Yes] Electronic Signature(s) Signed: 03/29/2020 3:30:56 PM By: Georges Mouse, Minus Breeding Entered By: Georges Mouse, Minus Breeding on 03/29/2020 14:08:54 Melinda [name] ([id number]) -------------------------------------------------------------------------------- Multi Wound Chart Details Patient Name: Melinda [name] Date of Service: 03/29/2020 2:15 PM Medical Record Number: [id number] Patient Account Number: [id number] Date of Birth/Sex: Feb 21, 1956 (64 y.o. F) Treating RN: Dolan Amen Primary Care Shalana Jardin: [name] Other Clinician: Referring Keysi Oelkers: [name] Treating Liah Morr/Extender: Skipper Cliche in Treatment: 14 Vital Signs Height(in): 21 Pulse(bpm): 135 Weight(lbs): Blood Pressure(mmHg): 107/65 Body Mass Index(BMI): Temperature(F): Respiratory Rate(breaths/min): 20 Photos: [N/A:N/A] Wound Location: Left, Plantar Toe Great N/A N/A Wounding Event: Gradually Appeared N/A N/A Primary Etiology: Diabetic Wound/Ulcer of the Lower N/A N/A Extremity Comorbid History: Cataracts, Coronary Artery Disease, N/A N/A Hypertension, Myocardial Infarction, Peripheral Venous Disease, Type II Diabetes, Osteoarthritis, Osteomyelitis, Neuropathy Date Acquired: 12/02/2019 N/A N/A Weeks of Treatment: 14 N/A N/A Wound Status: Open N/A N/A Measurements L x W x D (cm)  0.1x0.1x0.1 N/A N/A Area (cm) : 0.008 N/A N/A Volume (cm) : 0.001 N/A N/A % Reduction in Area: 99.70% N/A N/A % Reduction in Volume: 100.00% N/A N/A Classification: Grade 2 N/A N/A Exudate Amount: Small N/A N/A Exudate Type: Serous N/A N/A Exudate Color: amber N/A N/A Wound Margin: Thickened N/A N/A Granulation Amount: None Present (0%) N/A N/A Necrotic Amount: Large (67-100%) N/A N/A Exposed Structures: Fat Layer (Subcutaneous Tissue): N/A N/A Yes Fascia: No Tendon: No Muscle: No Joint: No Bone: No Epithelialization: Small (1-33%) N/A N/A Treatment Notes Electronic Signature(s) Signed: 03/29/2020 3:30:56 PM By: Georges Mouse, Minus Breeding Entered By: Georges Mouse, Minus Breeding on 03/29/2020 14:13:46 Melinda [name] ([id number]) -------------------------------------------------------------------------------- Multi-Disciplinary Care Plan Details Patient Name:  Melinda [name], Melinda E. Date of Service: 03/29/2020 2:15 PM Medical Record Number: [id number] Patient Account Number: [id number] Date of Birth/Sex: 06-01-1955 (64 y.o. F) Treating RN: Dolan Amen Primary Care Travanti Mcmanus: [name] Other Clinician: Referring Annaclaire Walsworth: [name] Treating Ripken Rekowski/Extender: Skipper Cliche in Treatment: 14 Active Inactive Necrotic Tissue Nursing Diagnoses: Impaired tissue integrity related to necrotic/devitalized tissue Goals: Necrotic/devitalized tissue will be minimized in the wound bed Date Initiated: 12/16/2019 Target Resolution Date: 12/26/2019 Goal Status: Active Interventions: Assess patient pain level pre-, during and post procedure and prior to discharge Treatment Activities: Excisional debridement : 12/16/2019 Notes: Orientation to the Wound Care Program Nursing Diagnoses: Knowledge deficit related to the wound healing center program Goals: Patient/caregiver will verbalize understanding of the [facility] Date Initiated: 12/16/2019 Target Resolution Date:  12/26/2019 Goal Status: Active Interventions: Provide education on orientation to the wound center Notes: Peripheral Neuropathy Nursing Diagnoses: Knowledge deficit related to disease process and management of peripheral neurovascular dysfunction Potential alteration in peripheral tissue perfusion (select prior to confirmation of diagnosis) Goals: Patient/caregiver will verbalize understanding of disease process and disease management Date Initiated: 12/16/2019 Target Resolution Date: 12/26/2019 Goal Status: Active Interventions: Provide education on Management of Neuropathy and Related Ulcers Treatment Activities: Patient referred for customized footwear/offloading : 12/16/2019 Notes: Pressure Nursing Diagnoses: Melinda [name], Melinda [name] ([id number]) Knowledge deficit related to causes and risk factors for pressure ulcer development Knowledge deficit related to management of pressures ulcers Goals: Patient will remain free from development of additional pressure ulcers Date Initiated: 12/16/2019 Target Resolution Date: 12/26/2019 Goal Status: Active Interventions: Provide education on pressure ulcers Notes: Wound/Skin Impairment Nursing Diagnoses: Impaired tissue integrity Goals: Ulcer/skin breakdown will have a volume reduction of 30% by week 4 Date Initiated: 12/16/2019 Target Resolution Date: 01/16/2020 Goal Status: Active Interventions: Assess patient/caregiver ability to obtain necessary supplies Assess ulceration(s) every visit Treatment Activities: Skin care regimen initiated : 12/16/2019 Notes: Electronic Signature(s) Signed: 03/29/2020 3:30:56 PM By: Georges Mouse, Minus Breeding Entered By: Georges Mouse, Minus Breeding on 03/29/2020 14:13:30 Melinda [name] ([id number]) -------------------------------------------------------------------------------- Pain Assessment Details Patient Name: Melinda [name] Date of Service: 03/29/2020 2:15 PM Medical Record Number: [id number] Patient  Account Number: [id number] Date of Birth/Sex: Nov 27, 1955 (64 y.o. F) Treating RN: Dolan Amen Primary Care Sylvester Minton: [name] Other Clinician: Referring Illyanna Petillo: [name] Treating Najat Olazabal/Extender: Skipper Cliche in Treatment: 14 Active Problems Location of Pain Severity and Description of Pain Patient Has Paino No Site Locations Rate the pain. Current Pain Level: 0 Pain Management and Medication Current Pain Management: Electronic Signature(s) Signed: 03/29/2020 3:30:56 PM By: Georges Mouse, [place] Entered By: Georges Mouse, Minus Breeding on 03/29/2020 14:02:23 Melinda [name] ([id number]) -------------------------------------------------------------------------------- Patient/Caregiver Education Details Patient Name: Melinda [name] Date of Service: 03/29/2020 2:15 PM Medical Record Number: [id number] Patient Account Number: [id number] Date of Birth/Gender: 1955/08/17 (64 y.o. F) Treating RN: Dolan Amen Primary Care Physician: [name] Other Clinician: Referring Physician: [name] Treating Physician/Extender: Skipper Cliche in Treatment: 14 Education Assessment Education Provided To: Patient Education Topics Provided Wound Debridement: Methods: Demonstration, Explain/Verbal Responses: State content correctly Wound/Skin Impairment: Methods: Demonstration Responses: State content correctly Electronic Signature(s) Signed: 03/29/2020 3:30:56 PM By: Georges Mouse, Minus Breeding Entered By: Georges Mouse, Minus Breeding on 03/29/2020 14:33:51 Melinda [name] ([id number]) -------------------------------------------------------------------------------- Wound Assessment Details Patient Name: Melinda [name] Date of Service: 03/29/2020 2:15 PM Medical Record Number: [id number] Patient Account Number: [id number] Date of Birth/Sex: 1956-01-06 (64 y.o. F) Treating RN: Dolan Amen Primary Care Suhana Wilner: [name] Other Clinician: Referring Rayne Cowdrey: [name]  Treating Akhila Mahnken/Extender: Jeri Cos Weeks in Treatment: 14 Wound Status Wound Number: 5 Primary Diabetic Wound/Ulcer of the Lower Extremity Etiology: Wound Location: Left, Plantar Toe Great Wound Open Wounding Event: Gradually Appeared Status: Date Acquired: 12/02/2019 Comorbid Cataracts, Coronary Artery Disease, Hypertension, Weeks Of Treatment: 14 History: Myocardial Infarction, Peripheral Venous Disease, Type II Clustered Wound: No Diabetes, Osteoarthritis, Osteomyelitis, Neuropathy Photos Wound Measurements Length: (cm) 0.1 % R Width: (cm) 0.1 % R Depth: (cm) 0.1 Epi Area: (cm) 0.008 Tu Volume: (cm) 0.001 Un eduction in Area: 99.7% eduction in Volume: 100% thelialization: Small (1-33%) nneling: No dermining: No Wound Description Classification: Grade 2 Fou Wound Margin: Thickened Slo Exudate Amount: Small Exudate Type: Serous Exudate Color: amber l Odor After Cleansing: No ugh/Fibrino Yes Wound Bed Granulation Amount: None Present (0%) Exposed Structure Necrotic Amount: Large (67-100%) Fascia Exposed: No Necrotic Quality: Adherent Slough Fat Layer (Subcutaneous Tissue) Exposed: Yes Tendon Exposed: No Muscle Exposed: No Joint Exposed: No Bone Exposed: No Treatment Notes Wound #5 (Left, Plantar Toe Great) Notes prisma, abd, conform, peg assist shoe Electronic Signature(s) Melinda [name], Melinda [name] ([id number]) Signed: 03/29/2020 3:30:56 PM By: Georges Mouse, Minus Breeding Entered By: Georges Mouse, Minus Breeding on 03/29/2020 14:08:10 Melinda [name] ([id number]) -------------------------------------------------------------------------------- Vitals Details Patient Name: Melinda [name] Date of Service: 03/29/2020 2:15 PM Medical Record Number: [id number] Patient Account Number: [id number] Date of Birth/Sex: 1955-07-01 (64 y.o. F) Treating RN: Dolan Amen Primary Care Tonna Palazzi: [name] Other Clinician: Referring Rishav Rockefeller: [name] Treating  Lanijah Warzecha/Extender: Skipper Cliche in Treatment: 14 Vital Signs Time Taken: 13:59 Temperature (F): 98 Height (in): 69 Pulse (bpm): 135 Respiratory Rate (breaths/min): 20 Blood Pressure (mmHg): 107/65 Reference Range: 80 - 120 mg / dl Electronic Signature(s) Signed: 03/29/2020 3:30:56 PM By: Georges Mouse, Minus Breeding Entered By: Georges Mouse, Minus Breeding on 03/29/2020 14:34:39

## 2020-03-31 NOTE — Progress Notes (Signed)
Office Visit    Patient Name: Melinda [name] Date of Encounter: 04/08/2020  Primary Care Provider:  Donnie Coffin, MD Primary Cardiologist:  Ida Rogue, MD  Chief Complaint    Chief Complaint  Patient presents with  . Follow-up    stress test results/meds reviewed by the pt. verbally/ pt. c/o shortness of breath.     64 year old female with history of CAD s/p CABG x4 (2016), HFrEF/ ICM, hypertension, hyperlipidemia, IDDM, tobacco use, PAD s/p repeat interventions and followed by VVS, CKD followed by [facility], obesity, anxiety, and who presents today s/p Lexiscan and new Afib/Aflutter noted during [place].  Past Medical History    Past Medical History:  Diagnosis Date  . Anxiety   . Atrial fibrillation ([place])   . Atrial flutter ([place])   . Coronary artery disease    a. 06/2014 NSTEMI s/p CABG x 4 (LIMA to LAD, VG to Diag, VG to OM, VG to PDA).  . Depression   . Diabetic neuropathy ([place])   . GERD (gastroesophageal reflux disease)   . HTN (hypertension)   . Hyperlipidemia LDL goal <70   . IDDM (insulin dependent diabetes mellitus)   . Ischemic cardiomyopathy    a. 06/2014 Echo: EF 40-45%, HK of entire inferolateral and inferior myocardium c/w infarct of RCA/LCx, GR2DD, mild MR  . Myocardial infarction ([place]) 2016  . OA (osteoarthritis) of knee   . Obesity   . Osteoarthritis   . Osteomyelitis ([place])   . Peripheral neuropathy   . Peripheral vascular disease ([place])    a. 09/2016 Periph Angio: CTO R popliteal, CTO L prox/mid SFA->Med Rx; b. 05/2017 PTA: LSFA (Viabahn covered stent x 2), DEB to L Post Tibial; c. 06/2017 ABI: R 0.44, L 1.05.  . Tobacco abuse    Past Surgical History:  Procedure Laterality Date  . ABDOMINAL AORTOGRAM W/LOWER EXTREMITY N/A 10/27/2016   Procedure: Abdominal Aortogram w/Lower Extremity;  Surgeon: Nelva Bush, MD;  Location: [place] [facility];  Service: Cardiovascular;  Laterality: N/A;  . AMPUTATION TOE Left 10/21/2015   Procedure: AMPUTATION  TOE;  Surgeon: Sharlotte Alamo, DPM;  Location: [facility];  Service: Podiatry;  Laterality: Left;  . AMPUTATION TOE Left 05/15/2017   Procedure: AMPUTATION LEFT GREAT TOE;  Surgeon: Sharlotte Alamo, DPM;  Location: [facility];  Service: Podiatry;  Laterality: Left;  . AORTIC VALVE REPLACEMENT (AVR)/CORONARY ARTERY BYPASS GRAFTING (CABG)    . BREAST BIOPSY    . CARDIAC CATHETERIZATION  06/2014   95% stenosis mLAD, occlusion ostial OM1, 70% stenosis LCx, 95% stenosis mRCA, EF 45%.  . CORONARY ARTERY BYPASS GRAFT N/A 06/08/2014   Procedure: CORONARY ARTERY BYPASS GRAFTING (CABG);  Surgeon: Grace Isaac, MD;  Location: [place];  Service: Open Heart Surgery;  Laterality: N/A;  Times 4 using left internal mammary artery to LAD artery and endoscopically harvested bilateral saphenous vein to Obtuse Marginal, Diagonal and Posterior Descending coronary arteries.  . CT ABD W & PELVIS WO CM  06/2014   nl liver, gallbladder, spleen, mild diverticular changes, no bowel wall inflammation, appendix nl, no hernia, no other sig abnormalities  . GASTRIC ROUX-EN-Y N/A 02/09/2020   Procedure: LAPAROSCOPIC ROUX-EN-Y GASTRIC BYPASS WITH UPPER ENDOSCOPY;  Surgeon: Johnathan Hausen, MD;  Location: [facility];  Service: General;  Laterality: N/A;  . LOWER EXTREMITY ANGIOGRAPHY Left 05/23/2017   Procedure: LOWER EXTREMITY ANGIOGRAPHY;  Surgeon: Algernon Huxley, MD;  Location: [place] [facility];  Service: Cardiovascular;  Laterality: Left;  . LOWER EXTREMITY  ANGIOGRAPHY Left 05/28/2017   Procedure: LOWER EXTREMITY ANGIOGRAPHY;  Surgeon: Algernon Huxley, MD;  Location: [place] [facility];  Service: Cardiovascular;  Laterality: Left;  . LOWER EXTREMITY ANGIOGRAPHY Left 12/16/2018   Procedure: LOWER EXTREMITY ANGIOGRAPHY;  Surgeon: Algernon Huxley, MD;  Location: [place] [facility];  Service: Cardiovascular;  Laterality: Left;  . LOWER EXTREMITY ANGIOGRAPHY Right 12/23/2018   Procedure: LOWER EXTREMITY ANGIOGRAPHY;  Surgeon: Algernon Huxley, MD;   Location: [place] [facility];  Service: Cardiovascular;  Laterality: Right;  . TEE WITHOUT CARDIOVERSION N/A 06/08/2014   Procedure: TRANSESOPHAGEAL ECHOCARDIOGRAM (TEE);  Surgeon: Grace Isaac, MD;  Location: [place];  Service: Open Heart Surgery;  Laterality: N/A;  . TOE AMPUTATION Right 12/2011   rt middle toe  . UPPER GI ENDOSCOPY N/A 02/09/2020   Procedure: UPPER GI ENDOSCOPY;  Surgeon: Johnathan Hausen, MD;  Location: [facility];  Service: General;  Laterality: N/A;  . US ECHOCARDIOGRAPHY  06/2014   EF 50-55%, HK of inf/post/inferolat walls, Ao sclerosis    Allergies  No Known Allergies  History of Present Illness    Melinda [name] is a 64 y.o. female with PMH as above.  She has a history of CAD s/p CABG, smoking (current), PAD, OA, and diabetes on insulin.    She has an extensive history of PAD and LE pain / parathesias, followed by VVS. She underwent left SFA stenting and PTA of the L popliteal artery 2018, toe amputation 2019 with ulceration and gangrene, 11/2018 PTA of anterior tibial artery, subsequent PTA of the left distal SFA, stent of distal L SFA, and repeat intervention 1 week later.  At follow-up, she denied claudication symptoms in her legs. Since that time, her claudication sx have returned as below, limiting her activity per pt. Most recent 03/2020 studies showing RLE with moderate dz but ABIs unchanged and LLE mild dz but ABIs decreased from 09/2019. Of note, she also has a history of arthritis of chest, arms, and shoulders, which she states further limits her acitivity.   She has a history of CAD and 4v CABG. She was admitted to [facility] 2/1 to 06/03/2014 for non-ST elevation myocardial infarction.  She underwent LHC 06/2014 that showed severe three-vessel disease.  She was transferred to [facility] from 2/3-2/13 for four-vessel CABG (LIMA to LAD, SVG to diagonal, SVG to OM, and SVG to PDA).  When seen 01/2020 for preoperative evaluation before gastric bypass surgery,  she had reportedly initially quit smoking with Chantix but then restarted with the stress of COVID-19. She was smoking 14 cigarettes daily.  She was on insulin for her DM2. She had experienced some dizziness with recommendation to follow-up with her PCP.  She noted worsening lower extremity pain and parathesias, ongoing OA, and limited activity with significant deconditioning. She had SOB with minimal activity that she attributed to deconditioning and that she stated was a gradual decline due to limited activity 2/2 LE pain.    Her case was discussed with her primary cardiologist same day with recommendation to proceed to gastric bypass surgery first and then update an echo to ensure stability. It was noted Lexiscan was not an ideal study due to likely reduced image quality that would not change management. On 03/02/2020, she obtained an updated echo that showed reduced EF 35-40%, reduced from her previous EF, and with LV wall motion abnormalities. There was moderate hypokinesis of the left ventricle, basal-mid inferior wall, and inferior wall. RV systolic function was mildly reduced.  G1DD was noted.   Seen via telemedicine 11/23 to discuss her echo and with recommendation for further workup with catheterization given her reduced EF and risk factors, including known history of vascular dz and as still a current smoker. At that time, she continued to note DOE with minimal activity; however, she noted that this was in the setting of recent COVID-19. She reproted that, 2 days after obtaining her echocardiogram, she tested positive for COVID-19. She had 1 week of high fever and lost of taste and smell. Since recover, she noted ongoing DOE, again attributed to deconditioning 2/2 pain with activity as well as recovery from COVID-19. LHC was discussed at length for further ischemic workup but politely declined by pt with preference to first obtain MPI. It was discussed at length that catheterization was more ideal,  given the high likelihood of reduced image quality with Lexiscan. Case was discussed with primary cardiologist same day.  During her stress test 04/01/2020, she was diagnosed with new paroxysmal atrial fibrillation/flutter with RVR.  Case was discussed with rounding cardiologist/primary cardiologist with recommendation to start Eliquis 5 mg twice daily, discontinue Plavix 75 mg daily, increase metoprolol tartrate to 50 mg twice daily, hold amlodipine, hold lisinopril, and follow-up in the clinic as scheduled on 04/06/2020.  Today, she returns to clinic and reports SOB, fatigue, L sided abdominal pain, and constipation. She notes ongoing LE pain 2/2 PAD. No CP or tachypalpitations. No reported s/sx of volume overload. Ventricular rate is still elevated today with pt report that it is improved from earlier in the day. Atrial fibrillation discussed at length, as well as options regarding treatment as outlined below. She reports medication compliance. No s/sx of bleeding.   Home Medications    Prior to Admission medications   Medication Sig Start Date End Date Taking? Authorizing Provider  ACCU-CHEK AVIVA PLUS test strip 1 each 3 (three) times daily. 12/31/19   [provider]  Accu-Chek Softclix Lancets lancets 1 each 3 (three) times daily. 01/13/20   [provider]  amLODipine (NORVASC) 5 MG tablet Take 5 mg by mouth daily.  04/21/19   [provider]  aspirin 81 MG EC tablet Take 1 tablet (81 mg total) by mouth daily. 05/17/17   Loletha Grayer, MD  atorvastatin (LIPITOR) 80 MG tablet TAKE 1 TABLET EVERY DAY  AT  6  PM Patient taking differently: Take 80 mg by mouth daily at 6 PM.  08/13/19   Gollan, Kathlene November, MD  clobetasol (TEMOVATE) 0.05 % external solution Patient to mix entire bottle with 1 jar of CeraVe and apply twice a day x 1 month then as needed. Avoid face, groin, underarms. Patient taking differently: Apply 1 application topically 2 (two) times daily. Patient to mix  entire bottle with 1 jar of CeraVe and apply twice a day x 1 month then as needed. Avoid face, groin, underarms. 10/07/19   Brendolyn Patty, MD  clopidogrel (PLAVIX) 75 MG tablet Take 1 tablet (75 mg total) by mouth daily. 06/26/19   Minna Merritts, MD  DROPLET PEN NEEDLES 31G X 6 MM MISC  11/10/19   [provider]  DULoxetine (CYMBALTA) 60 MG capsule Take 60 mg by mouth daily.  02/25/19   [provider]  Eflornithine HCl 13.9 % cream Apply to upper lip, chin and neck twice a day. Patient taking differently: Apply 1 application topically in the morning and at bedtime. Apply to upper lip, chin and neck twice a day. 10/07/19   Nicole Kindred,  Baxter Flattery, MD  Emollient (CERAVE EX) Apply 1 application topically in the morning and at bedtime. Mixed with clobetasol    [provider]  ferrous sulfate 325 (65 FE) MG tablet Take 325 mg by mouth daily.    [provider]  furosemide (LASIX) 20 MG tablet TAKE 1 TABLET (20 MG TOTAL) BY MOUTH DAILY AS NEEDED. Patient taking differently: Take 20 mg by mouth daily as needed (fluid retention.).  07/07/19   Minna Merritts, MD  LANTUS SOLOSTAR 100 UNIT/ML Solostar Pen Inject 70 Units into the skin in the morning and at bedtime.  11/05/19   [provider]  lidocaine (LIDODERM) 5 % Place 1 patch onto the skin daily as needed (PAIN.).  02/24/19   [provider]  lisinopril (PRINIVIL,ZESTRIL) 5 MG tablet Take 1 tablet (5 mg total) by mouth daily. 03/21/18   McNew, Tyson Babinski, MD  metoprolol tartrate (LOPRESSOR) 25 MG tablet TAKE 1/2 TABLET TWICE DAILY  (  BETA  BLOCKER  ) Patient taking differently: Take 12.5 mg by mouth in the morning and at bedtime.  10/09/19   Gollan, Kathlene November, MD  NOVOLOG FLEXPEN 100 UNIT/ML FlexPen Inject 6 Units into the skin 3 (three) times daily before meals. *HOLD FOR LOW BLOOD GLUCOSE READING* 01/09/20   [provider]  oxybutynin (DITROPAN) 5 MG tablet Take 5 mg by mouth 2 (two) times daily.   05/10/17   [provider]  potassium chloride SA (KLOR-CON) 20 MEQ tablet TAKE 1 TABLET (20 MEQ TOTAL) BY MOUTH DAILY AS NEEDED. Patient taking differently: Take 20 mEq by mouth daily as needed (with lasix (fluid retention)).  07/23/19   Theora Gianotti, NP  pregabalin (LYRICA) 100 MG capsule Take 100 mg by mouth 3 (three) times daily.  06/05/19   [provider]  tiZANidine (ZANAFLEX) 4 MG tablet Take 4 mg by mouth 3 (three) times daily. 06/14/18   [provider]  VICTOZA 18 MG/3ML SOPN Inject 1.2 mg into the skin daily.  01/15/19   [provider]    Review of Systems    She denies chest pain, palpitations, pnd, orthopnea, n, v, syncope, edema, weight gain, or early satiety.  She reports SOB/DOE, fatigue, constipation, and L side abdominal pain.  She reports bilateral lower extremity paresthesias and claudication symptoms, which limit her ability to ambulate.  Left foot walking shoe (reports due to ulcer).  All other systems reviewed and are otherwise negative except as noted above.  Physical Exam    VS:  BP (!) 82/50 (BP Location: Right Arm, Patient Position: Sitting, Cuff Size: Large)   Pulse (!) 114   Ht 5' 8.5" (1.74 m)   Wt 266 lb 4 oz (120.8 kg)   SpO2 99%   BMI 39.89 kg/m  , BMI Body mass index is 39.89 kg/m. GEN: Well nourished, well developed, in no acute distress. HEENT: normal. Neck: Supple, JVD difficult to assess due to body habitus, no carotid bruits, or masses. Cardiac: IRIR and tachycardic, no murmurs, rubs, or gallops. No clubbing, cyanosis, edema. L foot boot. Radials/DP/PT 2+ and equal bilaterally.  Respiratory:  Respirations regular and unlabored, clear to auscultation bilaterally. GI: Soft, nontender, nondistended, BS + x 4. MS: no deformity or atrophy.  Left foot in walking shoe, as noted on prior exams. Skin: warm and dry, no rash.   Neuro:  Strength and sensation are intact. Psych: Normal affect.  Accessory Clinical  Findings    ECG personally reviewed by me today -  atrial flutter with variable AV block, PVCs, ventricular rate 118 bpm, previously noted anterolateral and inferior ST/T changes- no acute changes.  VITALS Reviewed today   Temp Readings from Last 3 Encounters:  02/11/20 98.3 F (36.8 C) (Oral)  02/02/20 98.6 F (37 C) (Oral)  12/23/18 97.6 F (36.4 C)   BP Readings from Last 3 Encounters:  04/06/20 (!) 82/50  03/23/20 104/83  02/11/20 127/66   Pulse Readings from Last 3 Encounters:  04/06/20 (!) 114  02/11/20 100  02/03/20 87    Wt Readings from Last 3 Encounters:  04/06/20 266 lb 4 oz (120.8 kg)  03/23/20 271 lb (122.9 kg)  02/20/20 280 lb 3.2 oz (127.1 kg)     LABS  reviewed today   Lab Results  Component Value Date   WBC 10.0 02/11/2020   HGB 11.0 (L) 03/02/2020   HCT 35.1 (L) 02/11/2020   MCV 70.6 (L) 02/11/2020   PLT 334 02/11/2020   Lab Results  Component Value Date   CREATININE 1.53 (H) 03/02/2020   BUN 49 (H) 03/02/2020   NA 137 03/02/2020   K 4.9 03/02/2020   CL 105 03/02/2020   CO2 22 03/02/2020   Lab Results  Component Value Date   ALT 12 02/02/2020   AST 11 (L) 02/02/2020   ALKPHOS 104 02/02/2020   BILITOT 0.5 02/02/2020   Lab Results  Component Value Date   CHOL 92 03/14/2018   HDL 39 (L) 03/14/2018   LDLCALC 39 03/14/2018   TRIG 71 03/14/2018   CHOLHDL 2.4 03/14/2018    Lab Results  Component Value Date   HGBA1C 6.7 (H) 02/02/2020   Lab Results  Component Value Date   TSH 0.545 03/14/2018     STUDIES/PROCEDURES reviewed today   Echo 03/02/20 1. Left ventricular ejection fraction, by estimation, is 35 to 40%. The  left ventricle has moderately decreased function. The left ventricle  demonstrates regional wall motion abnormalities (see scoring  diagram/findings for description). Left ventricular  diastolic parameters are consistent with Grade I diastolic dysfunction  (impaired relaxation). There is moderate hypokinesis of  the left  ventricular, basal-mid inferior wall and inferolateral wall.  2. Right ventricular systolic function is mildly reduced. The right  ventricular size is normal. Tricuspid regurgitation signal is inadequate  for assessing PA pressure.  3. Left atrial size was mildly dilated.  4. Right atrial size was mildly dilated.  5. The mitral valve is normal in structure. Trivial mitral valve  regurgitation. No evidence of mitral stenosis. Moderate mitral annular  calcification.  6. The aortic valve is tricuspid. There is mild calcification of the  aortic valve. There is mild thickening of the aortic valve. Aortic valve  regurgitation is not visualized. Mild to moderate aortic valve  sclerosis/calcification is present, without any  evidence of aortic stenosis.  7. The inferior vena cava is normal in size with <50% respiratory variability, suggesting right atrial pressure of 8 mmHg.   Lexiscan Myoview 04/01/2020 Pharmacological myocardial perfusion imaging study with no significant  Ischemia Mild fixed perfusion defect in the apical region, mid inferolateral wall, distal anteroseptal region, basal inferior wall Global hypokinesis, EF estimated at 31% No EKG changes concerning for ischemia at peak stress or in recovery. Rhythm on EKG concerning for atrial fib/flutter, rate 133 bpm (new) CT attenuation correction images with moderate aortic arch atherosclerosis, coronary calcification in the LAD, RCA Low risk scan  ABIs 10/28/2019 Summary:  Right: Monophasic flow throughout suggesting inflow disease. Moderate  atherosclerosis. ABI  suggests moderate disease.  Left: Biphasic flow throughout. Moderate atherosclerosis. NL abi.   Lower extremity arterial study 10/28/2019 Summary:  Right: Monophasic flow throughout suggesting inflow disease. Moderate  atherosclerosis. ABI suggests moderate disease.  Left: Biphasic flow throughout. Moderate atherosclerosis. NL abi.   Abdominal aortogram  with lower extremity 10/27/2016 Conclusions: 1. Significant bilateral lower extremity PAD, including chronic total occlusion of the right popliteal artery and chronic total occlusion of the left proximal and mid superficial femoral artery. Recommendations: 1. Aggressive medical therapy and referral to supervise walking program. 2. If patient continues to have lifestyle limiting claudication, revascularization of the left SFA chronic total occlusion could not be attempted. Right popliteal CTO is more challenging to revascularize. Study will be reviewed with the PV team regarding potential for endovascular versus surgical revascularization. 3. Return to clinic next week for basic metabolic panel to ensure stable renal function and electrolytes. Nelva Bush, MD [facility]  Echo 2016 - Left ventricle: The cavity size was normal. There was moderate  concentric hypertrophy. Systolic function was mildly to  moderately reduced. The estimated ejection fraction was in the  range of 40% to 45%. Severe hypokinesis of the  entireinferolateral and inferior myocardium; consistent with  infarction in the distribution of the right coronary or left  circumflex coronary artery. Features are consistent with a  pseudonormal left ventricular filling pattern, with concomitant  abnormal relaxation and increased filling pressure (grade 2  diastolic dysfunction).  - Mitral valve: There was mild regurgitation.     Assessment & Plan   Newly diagnosed atrial flutter with rapid ventricular rate --Remains in atrial flutter with RVR. Denies any tachypalpitations but does note significant fatigue and dyspnea with minimal activity.   Atrial flutter was diagnosed at the time of her [place] 04/01/2020, which could have contributed to the symptoms.  CHA2DS2VASc score of at least 6 (CHF, HTN, agex1, DM2, vascular, female) with recommendation for indefinite oral anticoagulation to reduce the risk of  thromboembolic event. She was thus instructed to start anticoagulation with Eliquis 5 mg twice daily at time of MPI, which she is tolerating well without signs or symptoms of bleeding (filled this past Sunday 12/5).  Plavix was discontinued to reduce the risk of bleeding and after discussion with MD.  Hypotension has limited escalation of rate control while in atrial flutter.  Lisinopril/amlodipine held to allow escalation of beta-blocker but ventricular rate still not well controlled today, despite escalation to metoprolol 50 mg twice daily at time of MPI.  Case discussed with primary cardiologist, including consideration of [place] versus TEE/DCCV to avoid tachycardia induced cardiomyopathy with waiting 1 month for therapeutic anticoagulation. Plan for likely TEE/DCCV per MD. Future considerations could also include a sleep study, given report of snoring, which we will defer for now.  Check CBC on [place].  Check TSH, free T4, and BMET.  Coronary artery disease involving native coronary artery of native heart without angina S/p CABG x4 --As above, she has experienced significant dyspnea with minimal activity; however, this is in the setting of known recent COVID-19 infection and newly diagnosed atrial flutter at the time of her [place].  Echo as above with EF 35 to 40%.  Lexiscan Myoview performed given report of DOE/fatigue and ruled overall low risk as above, and at which time AT was diagnosed.  Consider that this atrial flutter could have been contributing to her symptoms reported at her telemedicine visit.  Case discussed with primary cardiologist. No plan for cath given low risk MPI and new  atrial flutter. Continue ongoing risk factor modification, including glycemic control. Continue current Lipitor.   Continue Eliquis with Plavix discontinued to reduce risk of bleeding. Discussed smoking cessation with prescription provided for Chantix, as previously this was prescribed by her primary cardiologist with  successful smoking cessation. Check labs as above.  Essential hypertension --BP today hypotensive. No report of dizziness.  She will continue to monitor her pressure going forward and call the office if ongoing hypotension or new dizziness.  Continue to hold lisinopril and amlodipine as previously instructed. See above.  ICM/HFrEF --Reports dyspnea with minimal activity.  As above, recent COVID-19 infection and diagnosis of atrial fibrillation/flutter. Updated echo as above with reduced EF. Given her reduced EF, Lexiscan was performed and ruled overall low risk.  Further recommendations as above.  Claudication/PAD --Recent lower extremity vascular studies as above by vascular surgery with reduced left side ABIs compared to prior study on 10/28/2019. She is monitored closely for PAD with h/o intervention to her lower extremities.  After case discussion with primary cardiologist, Plavix has been discontinued 2/2 increased risk of bleeding with initiation of Eliquis. Encouraged smoking cessation - restarted Chantix today.  Continue cholesterol control. Will check lipids and LFTs today.    Hyperlipidemia, LDL goal below 70 -We will recheck lipid and liver function today.  Further recommendations, if indicated, at that time  DM2 --Stressed glycemic control.Ongoing dietary changes. Continue to follow with PCP. Continue current insulin.  Tobacco use --As above in HPI, she was able to quit smoking on Chantix; however, during COVID-19, she restarted smoking and is smoking 14 cigarettes/day as previously noted. Discussed various strategies for quitting at her previous visit with patient preference to restart Chantix.  Prescription provided for Chantix today.  Medication changes: Start Chantix. Will discuss BB / rate control with MD, given hypotension today (with repeat BP checks)  Labs ordered: CMET, CBC, TSH, free T4, direct LDL, lipid panel. Studies / Imaging ordered: We will discuss  TEE/DCCV, given difficulty with controlling ventricular rate as above. Future considerations: See above. Disposition: RTC 1 month or sooner for TEE/DCCV if ongoing elevated ventricular rate and hypotension and per MD.   Arvil Chaco, [name]

## 2020-03-31 NOTE — H&P (View-Only) (Signed)
Office Visit    Patient Name: Melinda [name] Date of Encounter: 04/08/2020  Primary Care Provider:  Donnie Coffin, MD Primary Cardiologist:  Melinda Rogue, MD  Chief Complaint    Chief Complaint  Patient presents with  . Follow-up    stress test results/meds reviewed by the pt. verbally/ pt. c/o shortness of breath.     64 year old female with history of CAD s/p CABG x4 (2016), HFrEF/ ICM, hypertension, hyperlipidemia, IDDM, tobacco use, PAD s/p repeat interventions and followed by [facility], CKD followed by [facility], obesity, anxiety, and who presents today s/p Lexiscan and new Afib/Aflutter noted during [place].  Past Medical History    Past Medical History:  Diagnosis Date  . Anxiety   . Atrial fibrillation ([place])   . Atrial flutter ([place])   . Coronary artery disease    a. 06/2014 NSTEMI s/p CABG x 4 (LIMA to LAD, VG to Diag, VG to OM, VG to PDA).  . Depression   . Diabetic neuropathy ([place])   . GERD (gastroesophageal reflux disease)   . HTN (hypertension)   . Hyperlipidemia LDL goal <70   . IDDM (insulin dependent diabetes mellitus)   . Ischemic cardiomyopathy    a. 06/2014 Echo: EF 40-45%, HK of entire inferolateral and inferior myocardium c/w infarct of RCA/LCx, GR2DD, mild MR  . Myocardial infarction ([place]) 2016  . OA (osteoarthritis) of knee   . Obesity   . Osteoarthritis   . Osteomyelitis ([place])   . Peripheral neuropathy   . Peripheral vascular disease ([place])    a. 09/2016 Periph Angio: CTO R popliteal, CTO L prox/mid SFA->Med Rx; b. 05/2017 PTA: LSFA (Viabahn covered stent x 2), DEB to L Post Tibial; c. 06/2017 ABI: R 0.44, L 1.05.  . Tobacco abuse    Past Surgical History:  Procedure Laterality Date  . ABDOMINAL AORTOGRAM W/LOWER EXTREMITY N/A 10/27/2016   Procedure: Abdominal Aortogram w/Lower Extremity;  Surgeon: Melinda Bush, MD;  Location: [place] [facility];  Service: Cardiovascular;  Laterality: N/A;  . AMPUTATION TOE Left 10/21/2015   Procedure: AMPUTATION  TOE;  Surgeon: Melinda [name], DPM;  Location: [facility];  Service: Podiatry;  Laterality: Left;  . AMPUTATION TOE Left 05/15/2017   Procedure: AMPUTATION LEFT GREAT TOE;  Surgeon: Melinda [name], DPM;  Location: [facility];  Service: Podiatry;  Laterality: Left;  . AORTIC VALVE REPLACEMENT (AVR)/CORONARY ARTERY BYPASS GRAFTING (CABG)    . BREAST BIOPSY    . CARDIAC CATHETERIZATION  06/2014   95% stenosis mLAD, occlusion ostial OM1, 70% stenosis LCx, 95% stenosis mRCA, EF 45%.  . CORONARY ARTERY BYPASS GRAFT N/A 06/08/2014   Procedure: CORONARY ARTERY BYPASS GRAFTING (CABG);  Surgeon: Melinda Isaac, MD;  Location: [place];  Service: Open Heart Surgery;  Laterality: N/A;  Times 4 using left internal mammary artery to LAD artery and endoscopically harvested bilateral saphenous vein to Obtuse Marginal, Diagonal and Posterior Descending coronary arteries.  . CT ABD W & PELVIS WO CM  06/2014   nl liver, gallbladder, spleen, mild diverticular changes, no bowel wall inflammation, appendix nl, no hernia, no other sig abnormalities  . GASTRIC ROUX-EN-Y N/A 02/09/2020   Procedure: LAPAROSCOPIC ROUX-EN-Y GASTRIC BYPASS WITH UPPER ENDOSCOPY;  Surgeon: Melinda Hausen, MD;  Location: [facility];  Service: General;  Laterality: N/A;  . LOWER EXTREMITY ANGIOGRAPHY Left 05/23/2017   Procedure: LOWER EXTREMITY ANGIOGRAPHY;  Surgeon: Melinda Huxley, MD;  Location: [place] [facility];  Service: Cardiovascular;  Laterality: Left;  . LOWER EXTREMITY  ANGIOGRAPHY Left 05/28/2017   Procedure: LOWER EXTREMITY ANGIOGRAPHY;  Surgeon: Melinda Huxley, MD;  Location: [place] [facility];  Service: Cardiovascular;  Laterality: Left;  . LOWER EXTREMITY ANGIOGRAPHY Left 12/16/2018   Procedure: LOWER EXTREMITY ANGIOGRAPHY;  Surgeon: Melinda Huxley, MD;  Location: [place] [facility];  Service: Cardiovascular;  Laterality: Left;  . LOWER EXTREMITY ANGIOGRAPHY Right 12/23/2018   Procedure: LOWER EXTREMITY ANGIOGRAPHY;  Surgeon: Melinda Huxley, MD;   Location: [place] [facility];  Service: Cardiovascular;  Laterality: Right;  . TEE WITHOUT CARDIOVERSION N/A 06/08/2014   Procedure: TRANSESOPHAGEAL ECHOCARDIOGRAM (TEE);  Surgeon: Melinda Isaac, MD;  Location: [place];  Service: Open Heart Surgery;  Laterality: N/A;  . TOE AMPUTATION Right 12/2011   rt middle toe  . UPPER GI ENDOSCOPY N/A 02/09/2020   Procedure: UPPER GI ENDOSCOPY;  Surgeon: Melinda Hausen, MD;  [facility]: [facility];  Service: General;  Laterality: N/A;  . US ECHOCARDIOGRAPHY  06/2014   EF 50-55%, HK of inf/post/inferolat walls, Ao sclerosis    Allergies  No Known Allergies  History of Present Illness    Melinda [name] is a 64 y.o. female with PMH as above.  She has a history of CAD s/p CABG, smoking (current), PAD, OA, and diabetes on insulin.    She has an extensive history of PAD and LE pain / parathesias, followed by VVS. She underwent left SFA stenting and PTA of the L popliteal artery 2018, toe amputation 2019 with ulceration and gangrene, 11/2018 PTA of anterior tibial artery, subsequent PTA of the left distal SFA, stent of distal L SFA, and repeat intervention 1 week later.  At follow-up, she denied claudication symptoms in her legs. Since that time, her claudication sx have returned as below, limiting her activity per pt. Most recent 03/2020 studies showing RLE with moderate dz but ABIs unchanged and LLE mild dz but ABIs decreased from 09/2019. Of note, she also has a history of arthritis of chest, arms, and shoulders, which she states further limits her acitivity.   She has a history of CAD and 4v CABG. She was admitted to [facility] 2/1 to 06/03/2014 for non-ST elevation myocardial infarction.  She underwent LHC 06/2014 that showed severe three-vessel disease.  She was transferred to [facility] from 2/3-2/13 for four-vessel CABG (LIMA to LAD, SVG to diagonal, SVG to OM, and SVG to PDA).  When seen 01/2020 for preoperative evaluation before gastric bypass surgery,  she had reportedly initially quit smoking with Chantix but then restarted with the stress of COVID-19. She was smoking 14 cigarettes daily.  She was on insulin for her DM2. She had experienced some dizziness with recommendation to follow-up with her PCP.  She noted worsening lower extremity pain and parathesias, ongoing OA, and limited activity with significant deconditioning. She had SOB with minimal activity that she attributed to deconditioning and that she stated was a gradual decline due to limited activity 2/2 LE pain.    Her case was discussed with her primary cardiologist same day with recommendation to proceed to gastric bypass surgery first and then update an echo to ensure stability. It was noted Lexiscan was not an ideal study due to likely reduced image quality that would not change management. On 03/02/2020, she obtained an updated echo that showed reduced EF 35-40%, reduced from her previous EF, and with LV wall motion abnormalities. There was moderate hypokinesis of the left ventricle, basal-mid inferior wall, and inferior wall. RV systolic function was mildly reduced.  G1DD was noted.   Seen via telemedicine 11/23 to discuss her echo and with recommendation for further workup with catheterization given her reduced EF and risk factors, including known history of vascular dz and as still a current smoker. At that time, she continued to note DOE with minimal activity; however, she noted that this was in the setting of recent COVID-19. She reproted that, 2 days after obtaining her echocardiogram, she tested positive for COVID-19. She had 1 week of high fever and lost of taste and smell. Since recover, she noted ongoing DOE, again attributed to deconditioning 2/2 pain with activity as well as recovery from COVID-19. LHC was discussed at length for further ischemic workup but politely declined by pt with preference to first obtain MPI. It was discussed at length that catheterization was more ideal,  given the high likelihood of reduced image quality with Lexiscan. Case was discussed with primary cardiologist same day.  During her stress test 04/01/2020, she was diagnosed with new paroxysmal atrial fibrillation/flutter with RVR.  Case was discussed with rounding cardiologist/primary cardiologist with recommendation to start Eliquis 5 mg twice daily, discontinue Plavix 75 mg daily, increase metoprolol tartrate to 50 mg twice daily, hold amlodipine, hold lisinopril, and follow-up in the clinic as scheduled on 04/06/2020.  Today, she returns to clinic and reports SOB, fatigue, L sided abdominal pain, and constipation. She notes ongoing LE pain 2/2 PAD. No CP or tachypalpitations. No reported s/sx of volume overload. Ventricular rate is still elevated today with pt report that it is improved from earlier in the day. Atrial fibrillation discussed at length, as well as options regarding treatment as outlined below. She reports medication compliance. No s/sx of bleeding.   Home Medications    Prior to Admission medications   Medication Sig Start Date End Date Taking? Authorizing Provider  ACCU-CHEK AVIVA PLUS test strip 1 each 3 (three) times daily. 12/31/19   [provider]  Accu-Chek Softclix Lancets lancets 1 each 3 (three) times daily. 01/13/20   [provider]  amLODipine (NORVASC) 5 MG tablet Take 5 mg by mouth daily.  04/21/19   [provider]  aspirin 81 MG EC tablet Take 1 tablet (81 mg total) by mouth daily. 05/17/17   Loletha Grayer, MD  atorvastatin (LIPITOR) 80 MG tablet TAKE 1 TABLET EVERY DAY  AT  6  PM Patient taking differently: Take 80 mg by mouth daily at 6 PM.  08/13/19   Gollan, Kathlene November, MD  clobetasol (TEMOVATE) 0.05 % external solution Patient to mix entire bottle with 1 jar of CeraVe and apply twice a day x 1 month then as needed. Avoid face, groin, underarms. Patient taking differently: Apply 1 application topically 2 (two) times daily. Patient to mix  entire bottle with 1 jar of CeraVe and apply twice a day x 1 month then as needed. Avoid face, groin, underarms. 10/07/19   Brendolyn Patty, MD  clopidogrel (PLAVIX) 75 MG tablet Take 1 tablet (75 mg total) by mouth daily. 06/26/19   Minna Merritts, MD  DROPLET PEN NEEDLES 31G X 6 MM MISC  11/10/19   [provider]  DULoxetine (CYMBALTA) 60 MG capsule Take 60 mg by mouth daily.  02/25/19   [provider]  Eflornithine HCl 13.9 % cream Apply to upper lip, chin and neck twice a day. Patient taking differently: Apply 1 application topically in the morning and at bedtime. Apply to upper lip, chin and neck twice a day. 10/07/19   Nicole Kindred,  Baxter Flattery, MD  Emollient (CERAVE EX) Apply 1 application topically in the morning and at bedtime. Mixed with clobetasol    [provider]  ferrous sulfate 325 (65 FE) MG tablet Take 325 mg by mouth daily.    [provider]  furosemide (LASIX) 20 MG tablet TAKE 1 TABLET (20 MG TOTAL) BY MOUTH DAILY AS NEEDED. Patient taking differently: Take 20 mg by mouth daily as needed (fluid retention.).  07/07/19   Minna Merritts, MD  LANTUS SOLOSTAR 100 UNIT/ML Solostar Pen Inject 70 Units into the skin in the morning and at bedtime.  11/05/19   [provider]  lidocaine (LIDODERM) 5 % Place 1 patch onto the skin daily as needed (PAIN.).  02/24/19   [provider]  lisinopril (PRINIVIL,ZESTRIL) 5 MG tablet Take 1 tablet (5 mg total) by mouth daily. 03/21/18   McNew, Tyson Babinski, MD  metoprolol tartrate (LOPRESSOR) 25 MG tablet TAKE 1/2 TABLET TWICE DAILY  (  BETA  BLOCKER  ) Patient taking differently: Take 12.5 mg by mouth in the morning and at bedtime.  10/09/19   Gollan, Kathlene November, MD  NOVOLOG FLEXPEN 100 UNIT/ML FlexPen Inject 6 Units into the skin 3 (three) times daily before meals. *HOLD FOR LOW BLOOD GLUCOSE READING* 01/09/20   [provider]  oxybutynin (DITROPAN) 5 MG tablet Take 5 mg by mouth 2 (two) times daily.   05/10/17   [provider]  potassium chloride SA (KLOR-CON) 20 MEQ tablet TAKE 1 TABLET (20 MEQ TOTAL) BY MOUTH DAILY AS NEEDED. Patient taking differently: Take 20 mEq by mouth daily as needed (with lasix (fluid retention)).  07/23/19   Theora Gianotti, NP  pregabalin (LYRICA) 100 MG capsule Take 100 mg by mouth 3 (three) times daily.  06/05/19   [provider]  tiZANidine (ZANAFLEX) 4 MG tablet Take 4 mg by mouth 3 (three) times daily. 06/14/18   [provider]  VICTOZA 18 MG/3ML SOPN Inject 1.2 mg into the skin daily.  01/15/19   [provider]    Review of Systems    She denies chest pain, palpitations, pnd, orthopnea, n, v, syncope, edema, weight gain, or early satiety.  She reports SOB/DOE, fatigue, constipation, and L side abdominal pain.  She reports bilateral lower extremity paresthesias and claudication symptoms, which limit her ability to ambulate.  Left foot walking shoe (reports due to ulcer).  All other systems reviewed and are otherwise negative except as noted above.  Physical Exam    VS:  BP (!) 82/50 (BP Location: Right Arm, Patient Position: Sitting, Cuff Size: Large)   Pulse (!) 114   Ht 5' 8.5" (1.74 m)   Wt 266 lb 4 oz (120.8 kg)   SpO2 99%   BMI 39.89 kg/m  , BMI Body mass index is 39.89 kg/m. GEN: Well nourished, well developed, in no acute distress. HEENT: normal. Neck: Supple, JVD difficult to assess due to body habitus, no carotid bruits, or masses. Cardiac: IRIR and tachycardic, no murmurs, rubs, or gallops. No clubbing, cyanosis, edema. L foot boot. Radials/DP/PT 2+ and equal bilaterally.  Respiratory:  Respirations regular and unlabored, clear to auscultation bilaterally. GI: Soft, nontender, nondistended, BS + x 4. MS: no deformity or atrophy.  Left foot in walking shoe, as noted on prior exams. Skin: warm and dry, no rash.   Neuro:  Strength and sensation are intact. Psych: Normal affect.  Accessory Clinical  Findings    ECG personally reviewed by me today -  atrial flutter with variable AV block, PVCs, ventricular rate 118 bpm, previously noted anterolateral and inferior ST/T changes- no acute changes.  VITALS Reviewed today   Temp Readings from Last 3 Encounters:  02/11/20 98.3 F (36.8 C) (Oral)  02/02/20 98.6 F (37 C) (Oral)  12/23/18 97.6 F (36.4 C)   BP Readings from Last 3 Encounters:  04/06/20 (!) 82/50  03/23/20 104/83  02/11/20 127/66   Pulse Readings from Last 3 Encounters:  04/06/20 (!) 114  02/11/20 100  02/03/20 87    Wt Readings from Last 3 Encounters:  04/06/20 266 lb 4 oz (120.8 kg)  03/23/20 271 lb (122.9 kg)  02/20/20 280 lb 3.2 oz (127.1 kg)     LABS  reviewed today   Lab Results  Component Value Date   WBC 10.0 02/11/2020   HGB 11.0 (L) 03/02/2020   HCT 35.1 (L) 02/11/2020   MCV 70.6 (L) 02/11/2020   PLT 334 02/11/2020   Lab Results  Component Value Date   CREATININE 1.53 (H) 03/02/2020   BUN 49 (H) 03/02/2020   NA 137 03/02/2020   K 4.9 03/02/2020   CL 105 03/02/2020   CO2 22 03/02/2020   Lab Results  Component Value Date   ALT 12 02/02/2020   AST 11 (L) 02/02/2020   ALKPHOS 104 02/02/2020   BILITOT 0.5 02/02/2020   Lab Results  Component Value Date   CHOL 92 03/14/2018   HDL 39 (L) 03/14/2018   LDLCALC 39 03/14/2018   TRIG 71 03/14/2018   CHOLHDL 2.4 03/14/2018    Lab Results  Component Value Date   HGBA1C 6.7 (H) 02/02/2020   Lab Results  Component Value Date   TSH 0.545 03/14/2018     STUDIES/PROCEDURES reviewed today   Echo 03/02/20 1. Left ventricular ejection fraction, by estimation, is 35 to 40%. The  left ventricle has moderately decreased function. The left ventricle  demonstrates regional wall motion abnormalities (see scoring  diagram/findings for description). Left ventricular  diastolic parameters are consistent with Grade I diastolic dysfunction  (impaired relaxation). There is moderate hypokinesis of  the left  ventricular, basal-mid inferior wall and inferolateral wall.  2. Right ventricular systolic function is mildly reduced. The right  ventricular size is normal. Tricuspid regurgitation signal is inadequate  for assessing PA pressure.  3. Left atrial size was mildly dilated.  4. Right atrial size was mildly dilated.  5. The mitral valve is normal in structure. Trivial mitral valve  regurgitation. No evidence of mitral stenosis. Moderate mitral annular  calcification.  6. The aortic valve is tricuspid. There is mild calcification of the  aortic valve. There is mild thickening of the aortic valve. Aortic valve  regurgitation is not visualized. Mild to moderate aortic valve  sclerosis/calcification is present, without any  evidence of aortic stenosis.  7. The inferior vena cava is normal in size with <50% respiratory variability, suggesting right atrial pressure of 8 mmHg.   Lexiscan Myoview 04/01/2020 Pharmacological myocardial perfusion imaging study with no significant  Ischemia Mild fixed perfusion defect in the apical region, mid inferolateral wall, distal anteroseptal region, basal inferior wall Global hypokinesis, EF estimated at 31% No EKG changes concerning for ischemia at peak stress or in recovery. Rhythm on EKG concerning for atrial fib/flutter, rate 133 bpm (new) CT attenuation correction images with moderate aortic arch atherosclerosis, coronary calcification in the LAD, RCA Low risk scan  ABIs 10/28/2019 Summary:  Right: Monophasic flow throughout suggesting inflow disease. Moderate  atherosclerosis. ABI  suggests moderate disease.  Left: Biphasic flow throughout. Moderate atherosclerosis. NL abi.   Lower extremity arterial study 10/28/2019 Summary:  Right: Monophasic flow throughout suggesting inflow disease. Moderate  atherosclerosis. ABI suggests moderate disease.  Left: Biphasic flow throughout. Moderate atherosclerosis. NL abi.   Abdominal aortogram  with lower extremity 10/27/2016 Conclusions: 1. Significant bilateral lower extremity PAD, including chronic total occlusion of the right popliteal artery and chronic total occlusion of the left proximal and mid superficial femoral artery. Recommendations: 1. Aggressive medical therapy and referral to supervise walking program. 2. If patient continues to have lifestyle limiting claudication, revascularization of the left SFA chronic total occlusion could not be attempted. Right popliteal CTO is more challenging to revascularize. Study will be reviewed with the PV team regarding potential for endovascular versus surgical revascularization. 3. Return to clinic next week for basic metabolic panel to ensure stable renal function and electrolytes. Melinda Bush, MD [facility]  Echo 2016 - Left ventricle: The cavity size was normal. There was moderate  concentric hypertrophy. Systolic function was mildly to  moderately reduced. The estimated ejection fraction was in the  range of 40% to 45%. Severe hypokinesis of the  entireinferolateral and inferior myocardium; consistent with  infarction in the distribution of the right coronary or left  circumflex coronary artery. Features are consistent with a  pseudonormal left ventricular filling pattern, with concomitant  abnormal relaxation and increased filling pressure (grade 2  diastolic dysfunction).  - Mitral valve: There was mild regurgitation.     Assessment & Plan   Newly diagnosed atrial flutter with rapid ventricular rate --Remains in atrial flutter with RVR. Denies any tachypalpitations but does note significant fatigue and dyspnea with minimal activity.   Atrial flutter was diagnosed at the time of her [place] 04/01/2020, which could have contributed to the symptoms.  CHA2DS2VASc score of at least 6 (CHF, HTN, agex1, DM2, vascular, female) with recommendation for indefinite oral anticoagulation to reduce the risk of  thromboembolic event. She was thus instructed to start anticoagulation with Eliquis 5 mg twice daily at time of MPI, which she is tolerating well without signs or symptoms of bleeding (filled this past Sunday 12/5).  Plavix was discontinued to reduce the risk of bleeding and after discussion with MD.  Hypotension has limited escalation of rate control while in atrial flutter.  Lisinopril/amlodipine held to allow escalation of beta-blocker but ventricular rate still not well controlled today, despite escalation to metoprolol 50 mg twice daily at time of MPI.  Case discussed with primary cardiologist, including consideration of [place] versus TEE/DCCV to avoid tachycardia induced cardiomyopathy with waiting 1 month for therapeutic anticoagulation. Plan for likely TEE/DCCV per MD. Future considerations could also include a sleep study, given report of snoring, which we will defer for now.  Check CBC on [place].  Check TSH, free T4, and BMET.  Coronary artery disease involving native coronary artery of native heart without angina S/p CABG x4 --As above, she has experienced significant dyspnea with minimal activity; however, this is in the setting of known recent COVID-19 infection and newly diagnosed atrial flutter at the time of her [place].  Echo as above with EF 35 to 40%.  Lexiscan Myoview performed given report of DOE/fatigue and ruled overall low risk as above, and at which time AT was diagnosed.  Consider that this atrial flutter could have been contributing to her symptoms reported at her telemedicine visit.  Case discussed with primary cardiologist. No plan for cath given low risk MPI and new  atrial flutter. Continue ongoing risk factor modification, including glycemic control. Continue current Lipitor.   Continue Eliquis with Plavix discontinued to reduce risk of bleeding. Discussed smoking cessation with prescription provided for Chantix, as previously this was prescribed by her primary cardiologist with  successful smoking cessation. Check labs as above.  Essential hypertension --BP today hypotensive. No report of dizziness.  She will continue to monitor her pressure going forward and call the office if ongoing hypotension or new dizziness.  Continue to hold lisinopril and amlodipine as previously instructed. See above.  ICM/HFrEF --Reports dyspnea with minimal activity.  As above, recent COVID-19 infection and diagnosis of atrial fibrillation/flutter. Updated echo as above with reduced EF. Given her reduced EF, Lexiscan was performed and ruled overall low risk.  Further recommendations as above.  Claudication/PAD --Recent lower extremity vascular studies as above by vascular surgery with reduced left side ABIs compared to prior study on 10/28/2019. She is monitored closely for PAD with h/o intervention to her lower extremities.  After case discussion with primary cardiologist, Plavix has been discontinued 2/2 increased risk of bleeding with initiation of Eliquis. Encouraged smoking cessation - restarted Chantix today.  Continue cholesterol control. Will check lipids and LFTs today.    Hyperlipidemia, LDL goal below 70 -We will recheck lipid and liver function today.  Further recommendations, if indicated, at that time  DM2 --Stressed glycemic control.Ongoing dietary changes. Continue to follow with PCP. Continue current insulin.  Tobacco use --As above in HPI, she was able to quit smoking on Chantix; however, during COVID-19, she restarted smoking and is smoking 14 cigarettes/day as previously noted. Discussed various strategies for quitting at her previous visit with patient preference to restart Chantix.  Prescription provided for Chantix today.  Medication changes: Start Chantix. Will discuss BB / rate control with MD, given hypotension today (with repeat BP checks)  Labs ordered: CMET, CBC, TSH, free T4, direct LDL, lipid panel. Studies / Imaging ordered: We will discuss  TEE/DCCV, given difficulty with controlling ventricular rate as above. Future considerations: See above. Disposition: RTC 1 month or sooner for TEE/DCCV if ongoing elevated ventricular rate and hypotension and per MD.   Arvil Chaco, [name]

## 2020-04-01 ENCOUNTER — Encounter: Payer: Self-pay | Admitting: Physician Assistant

## 2020-04-01 ENCOUNTER — Other Ambulatory Visit: Payer: Self-pay | Admitting: Physician Assistant

## 2020-04-01 ENCOUNTER — Other Ambulatory Visit: Payer: Self-pay

## 2020-04-01 ENCOUNTER — Ambulatory Visit
Admission: RE | Admit: 2020-04-01 | Discharge: 2020-04-01 | Disposition: A | Discharge status: Home / Self Care | Payer: Medicare HMO | Source: Ambulatory Visit | Attending: Physician Assistant | Admitting: Physician Assistant

## 2020-04-01 ENCOUNTER — Telehealth: Payer: Self-pay | Admitting: Physician Assistant

## 2020-04-01 DIAGNOSIS — I5020 Unspecified systolic (congestive) heart failure: Secondary | ICD-10-CM

## 2020-04-01 DIAGNOSIS — I480 Paroxysmal atrial fibrillation: Secondary | ICD-10-CM

## 2020-04-01 HISTORY — DX: Unspecified atrial flutter: I48.92

## 2020-04-01 HISTORY — DX: Unspecified atrial fibrillation: I48.91

## 2020-04-01 LAB — NM MYOCAR MULTI W/SPECT W/WALL MOTION / EF
Estimated workload: 1 METS
Exercise duration (min): 0 min
Exercise duration (sec): 0 s
LV dias vol: 124 mL (ref 46–106)
LV sys vol: 67 mL
MPHR: 154 {beats}/min
Peak HR: 137 {beats}/min
Percent HR: 88 %
Rest HR: 133 {beats}/min
SDS: 13
SRS: 13
SSS: 24
TID: 0.99

## 2020-04-01 MED ORDER — REGADENOSON 0.4 MG/5ML IV SOLN
0.4000 mg | Freq: Once | INTRAVENOUS | Status: AC
  Administered 2020-04-01: 0.4 mg via INTRAVENOUS
  Filled 2020-04-01: qty 5

## 2020-04-01 MED ORDER — TECHNETIUM TC 99M TETROFOSMIN IV KIT
30.0000 | PACK | Freq: Once | INTRAVENOUS | Status: AC | PRN
  Administered 2020-04-01: 31.397 via INTRAVENOUS

## 2020-04-01 MED ORDER — METOPROLOL TARTRATE 50 MG PO TABS: 50.0000 mg | ORAL_TABLET | Freq: Two times a day (BID) | ORAL | 1 refills | Status: DC

## 2020-04-01 MED ORDER — METOPROLOL TARTRATE 5 MG/5ML IV SOLN
2.5000 mg | Freq: Once | INTRAVENOUS | Status: AC
  Administered 2020-04-01: 2.5 mg via INTRAVENOUS

## 2020-04-01 MED ORDER — APIXABAN 5 MG PO TABS: 5.0000 mg | ORAL_TABLET | Freq: Two times a day (BID) | ORAL | 1 refills | Status: DC

## 2020-04-01 MED ORDER — TECHNETIUM TC 99M TETROFOSMIN IV KIT
10.0000 | PACK | Freq: Once | INTRAVENOUS | Status: AC | PRN
  Administered 2020-04-01: 10.59 via INTRAVENOUS

## 2020-04-01 NOTE — Progress Notes (Signed)
New diagnosis of atrial fibrillation at time of the stress test.  The below instructions were printed for the patient in pt letter:  1) START NEW Eliquis 5mg  - take this twice per day.  New prescription called into your pharmacy / [facility] [address].   2) START INCREASED dose of metoprolol tartrate to 50mg  twice daily.   New prescription called into your pharmacy / [facility] on [address].  3) HOLD the below medications until your upcoming office visit with Marrianne Mood, [name] on 04/06/20.   Hold amlodipine 5mg  daily   Hold lisinopril 5mg  daily   4) STOP Plavix 75mg  daily.  5) Check your blood pressure each morning and 2 hours after your morning medications.  Call the office if the top number is consistently over 160 tomorrow 04/02/20 for further recommendations at that time. If your BP is elevated tomorrow, we may restart one of your blood pressure medications at that time.

## 2020-04-01 NOTE — Telephone Encounter (Signed)
New diagnosis of atrial fibrillation / flutter at time of the stress test.   Dr. Rockey Situ, rounding MD, was called down at the time of the stress test given new dx of atrial fibrillation / flutter.   Pt case and medication changes / instructions discussed with rounding MD at that time.   EKG / telemetry showed atrial fibrillation / flutter with ventricular rates in the 130s.   [id number] score of at least 6 (CHF, HTN, agex1, DM, vascular, female) with 9.7% stroke risk per year and recommendation for initiation of [place] to prevent thromboembolic event.   The below instructions were printed and highlighted for the patient in pt letter:  1) START NEW Eliquis 5mg  - take this twice per day. ? New prescription called into your pharmacy / [facility] [address].   2) START INCREASED dose of metoprolol tartrate to 50mg  twice daily.  ? New prescription called into your pharmacy / [facility] on [address].  3) HOLD the below medications until your upcoming office visit with Marrianne Mood, [name] on 04/06/20.  ? Hold amlodipine 5mg  daily  ? Hold lisinopril 5mg  daily   4) STOP Plavix 75mg  daily.  5) Check your blood pressure each morning and 2 hours after your morning medications. ? Call the office if the top number is consistently over 160 tomorrow 04/02/20 for further recommendations at that time. If your BP is elevated tomorrow, we may restart one of your blood pressure medications at that time.

## 2020-04-02 NOTE — Telephone Encounter (Signed)
Patient calling to update on med changes.     Pharmacy has not been able to deliver new medications yet so patient is continuing to take old medications until she can change.     Patient advised to discuss expectation of delivery or options with [facility] but clinic would be notified of above .   Please call

## 2020-04-05 ENCOUNTER — Encounter: Payer: Medicare HMO | Attending: Physician Assistant | Admitting: Physician Assistant

## 2020-04-05 ENCOUNTER — Other Ambulatory Visit: Payer: Self-pay

## 2020-04-05 DIAGNOSIS — E1151 Type 2 diabetes mellitus with diabetic peripheral angiopathy without gangrene: Secondary | ICD-10-CM | POA: Insufficient documentation

## 2020-04-05 DIAGNOSIS — Z89422 Acquired absence of other left toe(s): Secondary | ICD-10-CM | POA: Insufficient documentation

## 2020-04-05 DIAGNOSIS — Z794 Long term (current) use of insulin: Secondary | ICD-10-CM | POA: Diagnosis not present

## 2020-04-05 DIAGNOSIS — L84 Corns and callosities: Secondary | ICD-10-CM | POA: Insufficient documentation

## 2020-04-05 DIAGNOSIS — Z951 Presence of aortocoronary bypass graft: Secondary | ICD-10-CM | POA: Diagnosis not present

## 2020-04-05 DIAGNOSIS — L97522 Non-pressure chronic ulcer of other part of left foot with fat layer exposed: Secondary | ICD-10-CM | POA: Diagnosis not present

## 2020-04-05 DIAGNOSIS — I252 Old myocardial infarction: Secondary | ICD-10-CM | POA: Insufficient documentation

## 2020-04-05 DIAGNOSIS — Z89411 Acquired absence of right great toe: Secondary | ICD-10-CM | POA: Insufficient documentation

## 2020-04-05 DIAGNOSIS — E1142 Type 2 diabetes mellitus with diabetic polyneuropathy: Secondary | ICD-10-CM | POA: Insufficient documentation

## 2020-04-05 DIAGNOSIS — I10 Essential (primary) hypertension: Secondary | ICD-10-CM | POA: Diagnosis not present

## 2020-04-05 DIAGNOSIS — I2510 Atherosclerotic heart disease of native coronary artery without angina pectoris: Secondary | ICD-10-CM | POA: Insufficient documentation

## 2020-04-05 DIAGNOSIS — E11621 Type 2 diabetes mellitus with foot ulcer: Secondary | ICD-10-CM | POA: Insufficient documentation

## 2020-04-05 DIAGNOSIS — Z89421 Acquired absence of other right toe(s): Secondary | ICD-10-CM | POA: Insufficient documentation
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
## 2020-04-05 NOTE — Telephone Encounter (Signed)
Spoke with patient. Her pharmacy delivered her new medications yesterday.  She started them last night. We reviewed the regimen and she verbalized understanding. She is aware of her appointment tomorrow as scheduled.

## 2020-04-06 ENCOUNTER — Ambulatory Visit (INDEPENDENT_AMBULATORY_CARE_PROVIDER_SITE_OTHER): Payer: Medicare HMO | Admitting: Physician Assistant

## 2020-04-06 ENCOUNTER — Encounter: Payer: Self-pay | Admitting: Physician Assistant

## 2020-04-06 VITALS — BP 82/50 | HR 114 | Ht 68.5 in | Wt 266.2 lb

## 2020-04-06 DIAGNOSIS — I2510 Atherosclerotic heart disease of native coronary artery without angina pectoris: Secondary | ICD-10-CM | POA: Diagnosis not present

## 2020-04-06 DIAGNOSIS — I4892 Unspecified atrial flutter: Secondary | ICD-10-CM | POA: Diagnosis not present

## 2020-04-06 DIAGNOSIS — I739 Peripheral vascular disease, unspecified: Secondary | ICD-10-CM

## 2020-04-06 DIAGNOSIS — Z79899 Other long term (current) drug therapy: Secondary | ICD-10-CM

## 2020-04-06 DIAGNOSIS — I25118 Atherosclerotic heart disease of native coronary artery with other forms of angina pectoris: Secondary | ICD-10-CM

## 2020-04-06 DIAGNOSIS — E785 Hyperlipidemia, unspecified: Secondary | ICD-10-CM

## 2020-04-06 DIAGNOSIS — Z951 Presence of aortocoronary bypass graft: Secondary | ICD-10-CM

## 2020-04-06 DIAGNOSIS — I10 Essential (primary) hypertension: Secondary | ICD-10-CM

## 2020-04-06 DIAGNOSIS — Z720 Tobacco use: Secondary | ICD-10-CM

## 2020-04-06 DIAGNOSIS — I480 Paroxysmal atrial fibrillation: Secondary | ICD-10-CM

## 2020-04-06 DIAGNOSIS — I5020 Unspecified systolic (congestive) heart failure: Secondary | ICD-10-CM

## 2020-04-06 MED ORDER — CHANTIX STARTING MONTH PAK 0.5 MG X 11 & 1 MG X 42 PO TABS: ORAL_TABLET | ORAL | 0 refills | Status: DC

## 2020-04-06 NOTE — Patient Instructions (Signed)
Medication Instructions:   Your physician has recommended you make the following change in your medication:   START Chantix starter month dose pack - An Rx has been sent to your mail order pharmacy  *If you need a refill on your cardiac medications before your next appointment, please call your pharmacy*   Lab Work:  -  Your physician recommends that you return for lab work: Cmet, CBC, TSH, Free T4, Direct LDL, Lipid panel -  Please go to the [facility]. You will check in at the front desk to the right as you walk into the atrium. Valet Parking is offered if needed. - No appointment needed. You may go any day between 7 am and 6 pm.   If you have labs (blood work) drawn today and your tests are completely normal, you will receive your results only by: Marland Kitchen MyChart Message (if you have MyChart) OR . A paper copy in the mail If you have any lab test that is abnormal or we need to change your treatment, we will call you to review the results.   Testing/Procedures: None ordered   Follow-Up: At [facility], you and your health needs are our priority.  As part of our continuing mission to provide you with exceptional heart care, we have created designated Provider Care Teams.  These Care Teams include your primary Cardiologist (physician) and Advanced Practice Providers (APPs -  Physician Assistants and Nurse Practitioners) who all work together to provide you with the care you need, when you need it.  We recommend signing up for the patient portal called "MyChart".  Sign up information is provided on this After Visit Summary.  MyChart is used to connect with patients for Virtual Visits (Telemedicine).  Patients are able to view lab/test results, encounter notes, upcoming appointments, etc.  Non-urgent messages can be sent to your provider as well.   To learn more about what you can do with MyChart, go to [email or web address].    Your next appointment:   1 month(s)  The format  for your next appointment:   In Person  Provider:   You may see Ida Rogue, MD or one of the following Advanced Practice Providers on your designated Care Team:    Murray Hodgkins, NP  Christell Faith, [name]  Marrianne Mood, [name]  Cadence Kettering, [place]  Laurann Montana, NP

## 2020-04-06 NOTE — Progress Notes (Signed)
AVAYA, MCJUNKINS ([id number]) Visit Report for 04/05/2020 Arrival Information Details Patient Name: Melinda [name], Melinda [name] Date of Service: 04/05/2020 2:15 PM Medical Record Number: [id number] Patient Account Number: [id number] Date of Birth/Sex: 1955-12-26 (64 y.o. F) Treating RN: Dolan Amen Primary Care Nico Syme: [name] Other Clinician: Referring Iver Fehrenbach: [name] Treating Chaquita Basques/Extender: Skipper Cliche in Treatment: 15 Visit Information History Since Last Visit Pain Present Now: Yes Patient Arrived: Ambulatory Arrival Time: 14:16 Accompanied By: self Transfer Assistance: None Patient Identification Verified: Yes Secondary Verification Process Completed: Yes Patient Requires Transmission-Based No Precautions: Patient Has Alerts: Yes Patient Alerts: 10/28/19 ABI: R 1.05 L .60 Electronic Signature(s) Signed: 04/06/2020 10:25:55 AM By: Georges Mouse, Minus Breeding Entered By: Georges Mouse, Minus Breeding on 04/05/2020 14:16:33 Melinda [name] ([id number]) -------------------------------------------------------------------------------- Clinic Level of Care Assessment Details Patient Name: Melinda [name] Date of Service: 04/05/2020 2:15 PM Medical Record Number: [id number] Patient Account Number: [id number] Date of Birth/Sex: June 10, 1955 (64 y.o. F) Treating RN: [name] Primary Care Melena Hayes: [name] Other Clinician: Referring Kenlie Seki: [name] Treating Orabelle Rylee/Extender: Skipper Cliche in Treatment: 15 Clinic Level of Care Assessment Items TOOL 4 Quantity Score []  - Use when only an EandM is performed on FOLLOW-UP visit 0 ASSESSMENTS - Nursing Assessment / Reassessment X - Reassessment of Co-morbidities (includes updates in patient status) 1 10 X- 1 5 Reassessment of Adherence to Treatment Plan ASSESSMENTS - Wound and Skin Assessment / Reassessment X - Simple Wound Assessment / Reassessment - one wound 1 5 []  - 0 Complex Wound Assessment / Reassessment -  multiple wounds []  - 0 Dermatologic / Skin Assessment (not related to wound area) ASSESSMENTS - Focused Assessment []  - Circumferential Edema Measurements - multi extremities 0 []  - 0 Nutritional Assessment / Counseling / Intervention []  - 0 Lower Extremity Assessment (monofilament, tuning fork, pulses) []  - 0 Peripheral Arterial Disease Assessment (using hand held doppler) ASSESSMENTS - Ostomy and/or Continence Assessment and Care []  - Incontinence Assessment and Management 0 []  - 0 Ostomy Care Assessment and Management (repouching, etc.) PROCESS - Coordination of Care X - Simple Patient / Family Education for ongoing care 1 15 []  - 0 Complex (extensive) Patient / Family Education for ongoing care []  - 0 Staff obtains Programmer, systems, Records, Test Results / Process Orders []  - 0 Staff telephones HHA, Nursing Homes / Clarify orders / etc []  - 0 Routine Transfer to another Facility (non-emergent condition) []  - 0 Routine Hospital Admission (non-emergent condition) []  - 0 New Admissions / Biomedical engineer / Ordering NPWT, Apligraf, etc. []  - 0 Emergency Hospital Admission (emergent condition) X- 1 10 Simple Discharge Coordination []  - 0 Complex (extensive) Discharge Coordination PROCESS - Special Needs []  - Pediatric / Minor Patient Management 0 []  - 0 Isolation Patient Management []  - 0 Hearing / Language / Visual special needs []  - 0 Assessment of Community assistance (transportation, D/C planning, etc.) []  - 0 Additional assistance / Altered mentation []  - 0 Support Surface(s) Assessment (bed, cushion, seat, etc.) INTERVENTIONS - Wound Cleansing / Measurement Frix, Shardee E. ([id number]) X- 1 5 Simple Wound Cleansing - one wound []  - 0 Complex Wound Cleansing - multiple wounds X- 1 5 Wound Imaging (photographs - any number of wounds) []  - 0 Wound Tracing (instead of photographs) X- 1 5 Simple Wound Measurement - one wound []  - 0 Complex Wound Measurement  - multiple wounds INTERVENTIONS - Wound Dressings []  - Small Wound Dressing one or multiple wounds 0 X- 1 15 Medium Wound Dressing one or multiple  wounds []  - 0 Large Wound Dressing one or multiple wounds []  - 0 Application of Medications - topical []  - 0 Application of Medications - injection INTERVENTIONS - Miscellaneous []  - External ear exam 0 []  - 0 Specimen Collection (cultures, biopsies, blood, body fluids, etc.) []  - 0 Specimen(s) / Culture(s) sent or taken to Lab for analysis []  - 0 Patient Transfer (multiple staff / Civil Service fast streamer / Similar devices) []  - 0 Simple Staple / Suture removal (25 or less) []  - 0 Complex Staple / Suture removal (26 or more) []  - 0 Hypo / Hyperglycemic Management (close monitor of Blood Glucose) []  - 0 Ankle / Brachial Index (ABI) - do not check if billed separately X- 1 5 Vital Signs Has the patient been seen at the hospital within the last three years: Yes Total Score: 80 Level Of Care: New/Established - Level 3 Electronic Signature(s) Signed: 04/06/2020 5:20:48 PM By: [name], [name], RN, [name], [name] RN, [name] Entered By: [name], [name], RN, [name], [name] on 04/05/2020 17:08:19 Melinda [name] ([id number]) -------------------------------------------------------------------------------- Encounter Discharge Information Details Patient Name: Melinda [name] Date of Service: 04/05/2020 2:15 PM Medical Record Number: [id number] Patient Account Number: [id number] Date of Birth/Sex: 1956/03/28 (64 y.o. F) Treating RN: [name] Primary Care Demaryius Imran: [name] Other Clinician: Referring Jacqui Headen: [name] Treating Okie Bogacz/Extender: Skipper Cliche in Treatment: 15 Encounter Discharge Information Items Discharge Condition: Stable Ambulatory Status: Ambulatory Discharge Destination: Home Transportation: Private Auto Accompanied By: self Schedule Follow-up Appointment: Yes Clinical Summary of Care: Electronic Signature(s) Signed: 04/05/2020  5:11:30 PM By: [name], [name], RN, [name], [name] RN, [name] Entered By: [name], [name], RN, [name], [name] on 04/05/2020 17:11:30 Melinda [name] ([id number]) -------------------------------------------------------------------------------- General Visit Notes Details Patient Name: Melinda [name] Date of Service: 04/05/2020 2:15 PM Medical Record Number: [id number] Patient Account Number: [id number] Date of Birth/Sex: [birth year] (64 y.o. F) Treating RN: [name] Primary Care Annetta Deiss: [name] Other Clinician: Referring Marilynn Ekstein: [name] Treating Jannell Franta/Extender: Skipper Cliche in Treatment: 15 Notes Patient states at end of visit that she feels sick. RN asks if she needs to go to the bathroom. Patient states she does not think she will make it. RN gets patient an emesis basin and cold wash cloth. Electronic Signature(s) Signed: 04/05/2020 5:10:57 PM By: [name], [name], RN, [name], [name] RN, [name] Entered By: [name], [name], RN, [name], [name] on 04/05/2020 17:10:57 Melinda [name] ([id number]) -------------------------------------------------------------------------------- Lower Extremity Assessment Details Patient Name: Melinda [name], Melinda [name] Date of Service: 04/05/2020 2:15 PM Medical Record Number: [id number] Patient Account Number: [id number] Date of Birth/Sex: 08-30-1955 (64 y.o. F) Treating RN: Dolan Amen Primary Care Sarai January: [name] Other Clinician: Referring Otisha Spickler: [name] Treating Shakoya Gilmore/Extender: Jeri Cos Weeks in Treatment: 15 Edema Assessment Assessed: [Left: Yes] [Right: No] Edema: [Left: N] [Right: o] Electronic Signature(s) Signed: 04/06/2020 10:25:55 AM By: Georges Mouse, Minus Breeding Entered By: Georges Mouse, Minus Breeding on 04/05/2020 14:31:25 Melinda [name] ([id number]) -------------------------------------------------------------------------------- Multi Wound Chart Details Patient Name: Melinda [name] Date of Service: 04/05/2020 2:15 PM Medical Record Number:  [id number] Patient Account Number: [id number] Date of Birth/Sex: 1955/07/26 (64 y.o. F) Treating RN: [name] Primary Care Shae Augello: [name] Other Clinician: Referring Raenette Sakata: [name] Treating Gregori Abril/Extender: Skipper Cliche in Treatment: 15 Vital Signs Height(in): 69 Pulse(bpm): 123 Weight(lbs): Blood Pressure(mmHg): 103/73 Body Mass Index(BMI): Temperature(F): 98.3 Respiratory Rate(breaths/min): 18 Photos: [N/A:N/A] Wound Location: Left, Plantar Toe Great N/A N/A Wounding Event: Gradually Appeared N/A N/A Primary Etiology: Diabetic Wound/Ulcer of the Lower N/A N/A Extremity Comorbid  History: Cataracts, Coronary Artery Disease, N/A N/A Hypertension, Myocardial Infarction, Peripheral Venous Disease, Type II Diabetes, Osteoarthritis, Osteomyelitis, Neuropathy Date Acquired: 12/02/2019 N/A N/A Weeks of Treatment: 15 N/A N/A Wound Status: Open N/A N/A Measurements L x W x D (cm) 0.4x0.4x0.2 N/A N/A Area (cm) : 0.126 N/A N/A Volume (cm) : 0.025 N/A N/A % Reduction in Area: 95.00% N/A N/A % Reduction in Volume: 98.90% N/A N/A Classification: Grade 2 N/A N/A Exudate Amount: Small N/A N/A Exudate Type: Serous N/A N/A Exudate Color: amber N/A N/A Wound Margin: Thickened N/A N/A Granulation Amount: Large (67-100%) N/A N/A Granulation Quality: Pink, Pale N/A N/A Necrotic Amount: None Present (0%) N/A N/A Exposed Structures: Fat Layer (Subcutaneous Tissue): N/A N/A Yes Fascia: No Tendon: No Muscle: No Joint: No Bone: No Epithelialization: Small (1-33%) N/A N/A Treatment Notes Electronic Signature(s) Signed: 04/05/2020 5:07:32 PM By: [name], [name], RN, [name], [name] RN, [name] Entered By: [name], [name], RN, [name], [name] on 04/05/2020 17:07:32 Melinda [name], Melinda [name] ([id number] [name] ([id number]) -------------------------------------------------------------------------------- [place] Details Patient Name: Melinda [name], Melinda [name] Date of Service:  04/05/2020 2:15 PM Medical Record Number: [id number] Patient Account Number: [id number] Date of Birth/Sex: 05-21-1955 (64 y.o. F) Treating RN: [name] Primary Care Kirtan Sada: [name] Other Clinician: Referring Talar Fraley: [name] Treating Ilias Stcharles/Extender: Skipper Cliche in Treatment: 15 Active Inactive Necrotic Tissue Nursing Diagnoses: Impaired tissue integrity related to necrotic/devitalized tissue Goals: Necrotic/devitalized tissue will be minimized in the wound bed Date Initiated: 12/16/2019 Target Resolution Date: 12/26/2019 Goal Status: Active Interventions: Assess patient pain level pre-, during and post procedure and prior to discharge Treatment Activities: Excisional debridement : 12/16/2019 Notes: Orientation to the Wound Care Program Nursing Diagnoses: Knowledge deficit related to the wound healing center program Goals: Patient/caregiver will verbalize understanding of the [place] Date Initiated: 12/16/2019 Target Resolution Date: 12/26/2019 Goal Status: Active Interventions: Provide education on orientation to the wound center Notes: Peripheral Neuropathy Nursing Diagnoses: Knowledge deficit related to disease process and management of peripheral neurovascular dysfunction Potential alteration in peripheral tissue perfusion (select prior to confirmation of diagnosis) Goals: Patient/caregiver will verbalize understanding of disease process and disease management Date Initiated: 12/16/2019 Target Resolution Date: 12/26/2019 Goal Status: Active Interventions: Provide education on Management of Neuropathy and Related Ulcers Treatment Activities: Patient referred for customized footwear/offloading : 12/16/2019 Notes: Pressure Nursing Diagnoses: Melinda [name], Melinda [name] ([id number]) Knowledge deficit related to causes and risk factors for pressure ulcer development Knowledge deficit related to management of pressures ulcers Goals: Patient will  remain free from development of additional pressure ulcers Date Initiated: 12/16/2019 Target Resolution Date: 12/26/2019 Goal Status: Active Interventions: Provide education on pressure ulcers Notes: Wound/Skin Impairment Nursing Diagnoses: Impaired tissue integrity Goals: Ulcer/skin breakdown will have a volume reduction of 30% by week 4 Date Initiated: 12/16/2019 Target Resolution Date: 01/16/2020 Goal Status: Active Interventions: Assess patient/caregiver ability to obtain necessary supplies Assess ulceration(s) every visit Treatment Activities: Skin care regimen initiated : 12/16/2019 Notes: Electronic Signature(s) Signed: 04/05/2020 5:07:20 PM By: [name], [name], RN, [name], [name] RN, [name] Entered By: [name], [name], RN, [name], [name] on 04/05/2020 17:07:20 Melinda [name] ([id number]) -------------------------------------------------------------------------------- Pain Assessment Details Patient Name: Melinda [name] Date of Service: 04/05/2020 2:15 PM Medical Record Number: [id number] Patient Account Number: [id number] Date of Birth/Sex: 12-23-1955 (64 y.o. F) Treating RN: Dolan Amen Primary Care Tyrick Dunagan: [name] Other Clinician: Referring Tanya Marvin: [name] Treating Lennix Kneisel/Extender: Skipper Cliche in Treatment: 15 Active Problems Location of Pain Severity and Description of Pain Patient Has Paino  Yes Site Locations Pain Location: Generalized Pain Rate the pain. Current Pain Level: 7 Pain Management and Medication Current Pain Management: Electronic Signature(s) Signed: 04/06/2020 10:25:55 AM By: Georges Mouse, Kenia Entered By: Georges Mouse, Minus Breeding on 04/05/2020 14:24:08 Melinda [name] ([id number]) -------------------------------------------------------------------------------- Wound Assessment Details Patient Name: Melinda [name] Date of Service: 04/05/2020 2:15 PM Medical Record Number: [id number] Patient Account Number: [id number] Date of Birth/Sex:  [birth year] (64 y.o. F) Treating RN: [name] Primary Care Carollee Nussbaumer: [name] Other Clinician: Referring Asami Lambright: [name] Treating Roslynn Holte/Extender: Skipper Cliche in Treatment: 15 Wound Status Wound Number: 5 Primary Diabetic Wound/Ulcer of the Lower Extremity Etiology: Wound Location: Left, Plantar Toe Great Wound Open Wounding Event: Gradually Appeared Status: Date Acquired: 12/02/2019 Comorbid Cataracts, Coronary Artery Disease, Hypertension, Weeks Of Treatment: 15 History: Myocardial Infarction, Peripheral Venous Disease, Type II Clustered Wound: No Diabetes, Osteoarthritis, Osteomyelitis, Neuropathy Photos Wound Measurements Length: (cm) 0.4 % Re Width: (cm) 0.4 % Re Depth: (cm) 0.2 Epit Area: (cm) 0.126 Tun Volume: (cm) 0.025 Und duction in Area: 95% duction in Volume: 98.9% helialization: Small (1-33%) neling: No ermining: No Wound Description Classification: Grade 2 Foul Wound Margin: Thickened Slou Exudate Amount: Small Exudate Type: Serous Exudate Color: amber Odor After Cleansing: No gh/Fibrino No Wound Bed Granulation Amount: Large (67-100%) Exposed Structure Granulation Quality: Pink, Pale Fascia Exposed: No Necrotic Amount: None Present (0%) Fat Layer (Subcutaneous Tissue) Exposed: Yes Tendon Exposed: No Muscle Exposed: No Joint Exposed: No Bone Exposed: No Treatment Notes Wound #5 (Left, Plantar Toe Great) Notes prisma, abd, conform, peg assist shoe Electronic Signature(s) Melinda [name], Melinda [name] ([id number]) Signed: 04/06/2020 5:20:48 PM By: [name], [name], RN, [name], [name] RN, [name] Entered By: [name], [name], RN, [name], [name] on 04/05/2020 14:45:09 Melinda [name] ([id number]) -------------------------------------------------------------------------------- Vitals Details Patient Name: Melinda [name] Date of Service: 04/05/2020 2:15 PM Medical Record Number: [id number] Patient Account Number: [id number] Date of Birth/Sex: 1955/10/01 (64 y.o.  F) Treating RN: Dolan Amen Primary Care Ebrima Ranta: [name] Other Clinician: Referring Lorik Guo: [name] Treating Kimari Lienhard/Extender: Skipper Cliche in Treatment: 15 Vital Signs Time Taken: 14:16 Temperature (F): 98.3 Height (in): 69 Pulse (bpm): 123 Respiratory Rate (breaths/min): 18 Blood Pressure (mmHg): 103/73 Reference Range: 80 - 120 mg / dl Electronic Signature(s) Signed: 04/06/2020 10:25:55 AM By: Georges Mouse, Minus Breeding Entered By: Georges Mouse, Minus Breeding on 04/05/2020 14:22:55

## 2020-04-09 ENCOUNTER — Telehealth: Payer: Self-pay | Admitting: *Deleted

## 2020-04-09 ENCOUNTER — Other Ambulatory Visit: Payer: Self-pay | Admitting: Physician Assistant

## 2020-04-09 ENCOUNTER — Telehealth: Payer: Self-pay | Admitting: Physician Assistant

## 2020-04-09 NOTE — Progress Notes (Unsigned)
Called this patient 04/09/2020 to discuss recommendation by Dr. Rockey Situ for TEE/DCCV, given her difficult to control ventricular rate in atrial flutter at the time of her office visit.    We discussed the risks and benefits of the procedure with patient agreeable to proceed with this procedure.    All questions answered at this time.  Shared Decision Making/Informed Consent The risks [stroke, cardiac arrhythmias rarely resulting in the need for a temporary or permanent pacemaker, skin irritation or burns, esophageal damage, perforation (1:10,000 risk), bleeding, pharyngeal hematoma as well as other potential complications associated with conscious sedation including aspiration, arrhythmia, respiratory failure and death], benefits (treatment guidance, restoration of normal sinus rhythm, diagnostic support) and alternatives of a transesophageal echocardiogram guided cardioversion were discussed in detail with Ms. Tadlock and she is willing to proceed.   Signed, Arvil Chaco, [name] 04/09/2020, 11:06 AM

## 2020-04-09 NOTE — Telephone Encounter (Signed)
-----   Message from Arvil Chaco, [name] sent at 04/09/2020 10:52 AM EST ----- Regarding: TEE/DCCV Team MegChriquelin,  I just spoke with this patient to suggest a TEE/DCCV, given we have had trouble controlling her heart rate in new onset Aflutter.     She is agreeable to this procedure. I did a full consent over the phone - will document call in her chart.  Are you able to call her and set her up for a TEE/DCCV sometime next week with Dr. Rockey Situ?    Let me know if questions!  JV

## 2020-04-09 NOTE — Telephone Encounter (Signed)
Called this patient 04/09/2020 to discuss recommendation by Dr. Rockey Situ for TEE/DCCV, given her difficult to control ventricular rate in atrial flutter at the time of her office visit.    We discussed the risks and benefits of the procedure with patient agreeable to proceed with this procedure.    All questions answered at this time.  Shared Decision Making/Informed Consent The risks [stroke, cardiac arrhythmias rarely resulting in the need for a temporary or permanent pacemaker, skin irritation or burns, esophageal damage, perforation (1:10,000 risk), bleeding, pharyngeal hematoma as well as other potential complications associated with conscious sedation including aspiration, arrhythmia, respiratory failure and death], benefits (treatment guidance, restoration of normal sinus rhythm, diagnostic support) and alternatives of a transesophageal echocardiogram guided cardioversion were discussed in detail with Melinda [name] and she is willing to proceed.  Signed, Arvil Chaco, [name] 04/09/2020, 11:07 AM

## 2020-04-09 NOTE — Telephone Encounter (Signed)
Waiting to hear back from Dr. Rockey Situ when we can schedule procedure.

## 2020-04-09 NOTE — Telephone Encounter (Signed)
Spoke to pt. Notified her that I am working on scheduling her TEE/DCCV awaiting to hear back from provider regarding what date/time we may schedule. Pt aware we will call her back to discuss procedure instructions in detail. Pt also states that her HR has decr from 139 to 115. Made Jacqueline aware. Pt understands she is still advised to have procedure scheduled. Information below to be reviewed w/ pt after scheduled. Date/Time still pending at this time.   You are scheduled for a Cardioversion on ________________ with Dr.___________ Please arrive at the [place] [facility] at _________ a.m. on the day of your procedure.  DIET INSTRUCTIONS:  Nothing to eat or drink after midnight except your medications with a sip of water.  1) Labs: Bmet, CBC - Orders placed - Please arrive Wednesday 04/07/20 at the medical mall to have labs on the same day that you get your Covid test.      -  Please go to the [facility]. You will check in at the front desk to the right as you walk into the atrium.        Valet Parking is offered if needed.      - No appointment needed. You may go any day between 7 am and 6 pm.  COVID PRE- TEST: You will need a COVID TEST prior to the procedure:  LOCATION: [address] site.  DATE/TIME:  Wednesday 04/07/20 between 8AM - 1PM  2)  HOLD:  Furosemide (Lasix) the morning of procedure.       If you take insulin in the evening, take only 1/2 dose evening prior to procedure and HOLD taking any      insulin morning of procedure.   3)   Medications:  YOU MAY TAKE ALL of your remaining medications with a small amount of water.  4)   Must have a responsible person to drive you home.  5)   Bring a current list of your medications and current insurance cards.    If you have any questions after you get home, please call the office at [phone]

## 2020-04-12 ENCOUNTER — Other Ambulatory Visit: Payer: Self-pay | Admitting: Physician Assistant

## 2020-04-12 ENCOUNTER — Telehealth: Payer: Self-pay | Admitting: Physician Assistant

## 2020-04-12 NOTE — Telephone Encounter (Signed)
Pt c/o medication issue:  1. Name of Medication: Varenicline   2. How are you currently taking this medication (dosage and times per day)? Received mailed rx for 1 mg po q d   3. Are you having a reaction (difficulty breathing--STAT)? no  4. What is your medication issue? Patient received this via mail.  Advised that rx name is the generic for chantix.  Med list has 2 orders for same med.  Please call to discuss correct dosing.

## 2020-04-12 NOTE — Telephone Encounter (Signed)
Update from [facility] & Dr. Rockey Situ this morning. Per the providers and anesthesia, the final answer for a TEE/ DCCV this week is going to be on Wednesday 04/14/20 at 10:00 am.  I have scheduled this with Marcie Bal.  I have called and spoken with the patient and given her the instructions for her procedure as listed below. She is unable to get a ride today for her lab work & COVID swab. She will have this done tomorrow morning as early as possible.  The patient verbalizes understanding of these instructions as stated below.   Message sent to precert.    You are scheduled for a Cardioversion on Wednesday 04/14/20 with Dr. Rockey Situ.  Please arrive at the [place] [facility] at 9:00 a.m. on the day of your procedure.  DIET INSTRUCTIONS:  Nothing to eat or drink after midnight the night before your procedure.         1) Labs:  - Pre procedure lab work: 04/13/20 (7:30 am- 12:30 pm) [place] entrance at [facility] 1st desk on the right past the screening table  - Pre procedure COVID test: 04/13/20 (8:00 am- 1:00 pm) [facility] [place] entrance at [facility] up test (to be done after you lab work) Staff will come out to the car to swab you   2) Medications:  You may take all of your regular morning medications the day of your procedure unless listed below:  - HOLD lasix (furosemide) the morning of your procedure - HOLD any insulin the morning of your procedure - YOU may take a 1/2 of your usual dose of insulin the night prior to your procedure   3) Must have a responsible person to drive you home.   4) Bring a current list of your medications and current insurance cards.    If you have any questions after you get home, please call the office at [phone]

## 2020-04-13 ENCOUNTER — Telehealth: Payer: Self-pay

## 2020-04-13 ENCOUNTER — Other Ambulatory Visit
Admission: RE | Admit: 2020-04-13 | Discharge: 2020-04-13 | Disposition: A | Discharge status: Home / Self Care | Payer: Medicare HMO | Source: Ambulatory Visit | Attending: Physician Assistant | Admitting: Physician Assistant

## 2020-04-13 ENCOUNTER — Other Ambulatory Visit: Payer: Self-pay

## 2020-04-13 ENCOUNTER — Other Ambulatory Visit
Admission: RE | Admit: 2020-04-13 | Discharge: 2020-04-13 | Disposition: A | Discharge status: Home / Self Care | Payer: Medicare HMO | Source: Ambulatory Visit | Attending: Cardiovascular Disease | Admitting: Cardiovascular Disease

## 2020-04-13 DIAGNOSIS — I2510 Atherosclerotic heart disease of native coronary artery without angina pectoris: Secondary | ICD-10-CM

## 2020-04-13 DIAGNOSIS — I480 Paroxysmal atrial fibrillation: Secondary | ICD-10-CM | POA: Insufficient documentation

## 2020-04-13 DIAGNOSIS — Z20822 Contact with and (suspected) exposure to covid-19: Secondary | ICD-10-CM | POA: Diagnosis not present

## 2020-04-13 DIAGNOSIS — Z79899 Other long term (current) drug therapy: Secondary | ICD-10-CM | POA: Insufficient documentation

## 2020-04-13 DIAGNOSIS — I10 Essential (primary) hypertension: Secondary | ICD-10-CM | POA: Diagnosis not present

## 2020-04-13 DIAGNOSIS — Z01812 Encounter for preprocedural laboratory examination: Secondary | ICD-10-CM | POA: Insufficient documentation

## 2020-04-13 DIAGNOSIS — E785 Hyperlipidemia, unspecified: Secondary | ICD-10-CM | POA: Insufficient documentation

## 2020-04-13 LAB — COMPREHENSIVE METABOLIC PANEL
ALT: 19 U/L (ref 0–44)
AST: 15 U/L (ref 15–41)
Albumin: 3.1 g/dL — ABNORMAL LOW (ref 3.5–5.0)
Alkaline Phosphatase: 93 U/L (ref 38–126)
Anion gap: 11 (ref 5–15)
BUN: 18 mg/dL (ref 8–23)
CO2: 26 mmol/L (ref 22–32)
Calcium: 8.7 mg/dL — ABNORMAL LOW (ref 8.9–10.3)
Chloride: 105 mmol/L (ref 98–111)
Creatinine, Ser: 1.17 mg/dL — ABNORMAL HIGH (ref 0.44–1.00)
GFR, Estimated: 51 mL/min — ABNORMAL LOW (ref >60)
Glucose, Bld: 169 mg/dL — ABNORMAL HIGH (ref 70–99)
Potassium: 4 mmol/L (ref 3.5–5.1)
Sodium: 142 mmol/L (ref 135–145)
Total Bilirubin: 0.8 mg/dL (ref 0.3–1.2)
Total Protein: 6.5 g/dL (ref 6.5–8.1)

## 2020-04-13 LAB — LIPID PANEL
Cholesterol: 74 mg/dL (ref 0–200)
HDL: 33 mg/dL — ABNORMAL LOW (ref >40)
LDL Cholesterol: 23 mg/dL (ref 0–99)
Total CHOL/HDL Ratio: 2.2 RATIO
Triglycerides: 89 mg/dL (ref <150)
VLDL: 18 mg/dL (ref 0–40)

## 2020-04-13 LAB — SARS CORONAVIRUS 2 (TAT 6-24 HRS): SARS Coronavirus 2: NEGATIVE

## 2020-04-13 LAB — CBC
HCT: 35 % — ABNORMAL LOW (ref 36.0–46.0)
Hemoglobin: 11.3 g/dL — ABNORMAL LOW (ref 12.0–15.0)
MCH: 22.6 pg — ABNORMAL LOW (ref 26.0–34.0)
MCHC: 32.3 g/dL (ref 30.0–36.0)
MCV: 69.9 fL — ABNORMAL LOW (ref 80.0–100.0)
Platelets: 282 10*3/uL (ref 150–400)
RBC: 5.01 MIL/uL (ref 3.87–5.11)
RDW: 23.7 % — ABNORMAL HIGH (ref 11.5–15.5)
WBC: 5.8 10*3/uL (ref 4.0–10.5)
nRBC: 0 % (ref 0.0–0.2)

## 2020-04-13 LAB — LDL CHOLESTEROL, DIRECT: Direct LDL: 27.8 mg/dL (ref 0–99)

## 2020-04-13 LAB — T4, FREE: Free T4: 0.99 ng/dL (ref 0.61–1.12)

## 2020-04-13 LAB — TSH: TSH: 1.122 u[IU]/mL (ref 0.350–4.500)

## 2020-04-13 NOTE — Telephone Encounter (Signed)
Pt called, needing to move her TEE/CV from 10am arrival to 7am arrival d/t anesthesiologist scheduling. Pt reports she should be able to accommodate this new time of arrival. Reports if she is not able to make it d/t unfortunate circumstance she will call the office first thing in the am, otherwise all questions or concerns were address and no additional concerns at this time. Agreeable to plan, will call back for anything further.

## 2020-04-13 NOTE — Telephone Encounter (Signed)
I spoke to pt. Notified of Chantix instructions below:  Chantix 1 mg   Days 1-3 take 1/2 tablet (0.5 mg) once daily  Days 4-7 take 1/2 tablet (0.5 mg) twice daily  Day 8 through end of treatment take whole tablet (1 mg) twice daily.   Pt verbalized understanding. Pt has only 1 month supply and per last message by Pam this is normally taken for 12 weeks. I wanted to verify with you if ok to send refills for pt to complete 12 week course.

## 2020-04-13 NOTE — Telephone Encounter (Signed)
Yes, please provide instructions for Chantix.  Thank you.

## 2020-04-14 ENCOUNTER — Encounter: Payer: Self-pay | Admitting: Cardiovascular Disease

## 2020-04-14 ENCOUNTER — Ambulatory Visit: Payer: Medicare HMO | Admitting: Certified Registered Nurse Anesthetist

## 2020-04-14 ENCOUNTER — Ambulatory Visit (HOSPITAL_BASED_OUTPATIENT_CLINIC_OR_DEPARTMENT_OTHER)
Admission: RE | Admit: 2020-04-14 | Discharge: 2020-04-14 | Disposition: A | Discharge status: Home / Self Care | Payer: Medicare HMO | Source: Home / Self Care | Attending: Physician Assistant | Admitting: Physician Assistant

## 2020-04-14 ENCOUNTER — Ambulatory Visit
Admission: RE | Admit: 2020-04-14 | Discharge: 2020-04-14 | Disposition: A | Discharge status: Home / Self Care | Payer: Medicare HMO | Attending: Cardiovascular Disease | Admitting: Cardiovascular Disease

## 2020-04-14 ENCOUNTER — Other Ambulatory Visit: Payer: Self-pay

## 2020-04-14 ENCOUNTER — Encounter
Admission: RE | Disposition: A | Discharge status: Home / Self Care | Payer: Self-pay | Source: Home / Self Care | Attending: Cardiovascular Disease

## 2020-04-14 DIAGNOSIS — Z7902 Long term (current) use of antithrombotics/antiplatelets: Secondary | ICD-10-CM | POA: Diagnosis not present

## 2020-04-14 DIAGNOSIS — Z951 Presence of aortocoronary bypass graft: Secondary | ICD-10-CM | POA: Insufficient documentation

## 2020-04-14 DIAGNOSIS — E1151 Type 2 diabetes mellitus with diabetic peripheral angiopathy without gangrene: Secondary | ICD-10-CM | POA: Diagnosis not present

## 2020-04-14 DIAGNOSIS — I2510 Atherosclerotic heart disease of native coronary artery without angina pectoris: Secondary | ICD-10-CM | POA: Insufficient documentation

## 2020-04-14 DIAGNOSIS — Z79899 Other long term (current) drug therapy: Secondary | ICD-10-CM | POA: Diagnosis not present

## 2020-04-14 DIAGNOSIS — I959 Hypotension, unspecified: Secondary | ICD-10-CM | POA: Insufficient documentation

## 2020-04-14 DIAGNOSIS — I480 Paroxysmal atrial fibrillation: Secondary | ICD-10-CM | POA: Diagnosis not present

## 2020-04-14 DIAGNOSIS — Z7982 Long term (current) use of aspirin: Secondary | ICD-10-CM | POA: Diagnosis not present

## 2020-04-14 DIAGNOSIS — Z8616 Personal history of COVID-19: Secondary | ICD-10-CM | POA: Insufficient documentation

## 2020-04-14 DIAGNOSIS — I255 Ischemic cardiomyopathy: Secondary | ICD-10-CM | POA: Insufficient documentation

## 2020-04-14 DIAGNOSIS — I361 Nonrheumatic tricuspid (valve) insufficiency: Secondary | ICD-10-CM | POA: Diagnosis not present

## 2020-04-14 DIAGNOSIS — I483 Typical atrial flutter: Secondary | ICD-10-CM | POA: Diagnosis not present

## 2020-04-14 DIAGNOSIS — I110 Hypertensive heart disease with heart failure: Secondary | ICD-10-CM | POA: Diagnosis not present

## 2020-04-14 DIAGNOSIS — E785 Hyperlipidemia, unspecified: Secondary | ICD-10-CM | POA: Insufficient documentation

## 2020-04-14 DIAGNOSIS — Z794 Long term (current) use of insulin: Secondary | ICD-10-CM | POA: Insufficient documentation

## 2020-04-14 DIAGNOSIS — I340 Nonrheumatic mitral (valve) insufficiency: Secondary | ICD-10-CM | POA: Insufficient documentation

## 2020-04-14 DIAGNOSIS — I509 Heart failure, unspecified: Secondary | ICD-10-CM | POA: Insufficient documentation

## 2020-04-14 DIAGNOSIS — I4892 Unspecified atrial flutter: Secondary | ICD-10-CM | POA: Diagnosis not present

## 2020-04-14 HISTORY — PX: TEE WITHOUT CARDIOVERSION: SHX5443

## 2020-04-14 HISTORY — PX: CARDIOVERSION: SHX1299

## 2020-04-14 LAB — GLUCOSE, CAPILLARY: Glucose-Capillary: 180 mg/dL — ABNORMAL HIGH (ref 70–99)

## 2020-04-14 SURGERY — CARDIOVERSION
Anesthesia: General

## 2020-04-14 MED ORDER — BUTAMBEN-TETRACAINE-BENZOCAINE 2-2-14 % EX AERO
INHALATION_SPRAY | CUTANEOUS | Status: DC | PRN
  Administered 2020-04-14: 08:00:00 4 via TOPICAL

## 2020-04-14 MED ORDER — PROPOFOL 10 MG/ML IV BOLUS
INTRAVENOUS | Status: DC | PRN
  Administered 2020-04-14: 50 mg via INTRAVENOUS
  Administered 2020-04-14 (×5): 20 mg via INTRAVENOUS

## 2020-04-14 MED ORDER — SODIUM CHLORIDE 0.9 % IV SOLN: INTRAVENOUS | Status: DC

## 2020-04-14 MED ORDER — FENTANYL CITRATE (PF) 100 MCG/2ML IJ SOLN: 25.0000 ug | INTRAMUSCULAR | Status: DC | PRN

## 2020-04-14 MED ORDER — ONDANSETRON HCL 4 MG/2ML IJ SOLN: 4.0000 mg | Freq: Once | INTRAMUSCULAR | Status: DC | PRN

## 2020-04-14 MED ORDER — LIDOCAINE VISCOUS HCL 2 % MT SOLN
OROMUCOSAL | Status: DC | PRN
  Administered 2020-04-14: 15 mL via OROMUCOSAL

## 2020-04-14 MED ORDER — BUTAMBEN-TETRACAINE-BENZOCAINE 2-2-14 % EX AERO
INHALATION_SPRAY | CUTANEOUS | Status: AC
  Filled 2020-04-14: qty 5

## 2020-04-14 MED ORDER — SODIUM CHLORIDE FLUSH 0.9 % IV SOLN
INTRAVENOUS | Status: AC
  Filled 2020-04-14: qty 10

## 2020-04-14 MED ORDER — LIDOCAINE VISCOUS HCL 2 % MT SOLN
OROMUCOSAL | Status: AC
  Filled 2020-04-14: qty 15

## 2020-04-14 NOTE — CV Procedure (Signed)
Cardioversion procedure note For atrial flutter, typical  Procedure Details:  Consent: Risks of procedure as well as the alternatives and risks of each were explained to the (patient/caregiver). Consent for procedure obtained.  Time Out: Verified patient identification, verified procedure, site/side was marked, verified correct patient position, special equipment/implants available, medications/allergies/relevent history reviewed, required imaging and test results available. Performed  Patient placed on cardiac monitor, pulse oximetry, supplemental oxygen as necessary.  Sedation given: propofol IV, Dr. Andree Elk Pacer pads placed anterior and posterior chest.   Cardioverted 2 time(s).  Cardioverted at 150 J, 200J. Synchronized biphasic Converted to NSR   Evaluation: Findings: Post procedure EKG shows: NSR Complications: None Patient did tolerate procedure well.  Time Spent Directly with the Patient:  52 minutes   Esmond Plants, M.D., Ph.D.

## 2020-04-14 NOTE — Transfer of Care (Signed)
Immediate Anesthesia Transfer of Care Note  Patient: Melinda [name]  Procedure(s) Performed: CARDIOVERSION with TEE (N/A ) TRANSESOPHAGEAL ECHOCARDIOGRAM (TEE) (N/A )  Patient Location: Short Stay  Anesthesia Type:General  Level of Consciousness: drowsy  Airway & Oxygen Therapy: Patient Spontanous Breathing and Patient connected to nasal cannula oxygen  Post-op Assessment: Report given to RN and Post -op Vital signs reviewed and stable  Post vital signs: Reviewed and stable  Last Vitals:  Vitals Value Taken Time  BP 106/61 04/14/20 0826  Temp    Pulse 114 04/14/20 0814  Resp 21 04/14/20 0826  SpO2 96 % 04/14/20 0814    Last Pain:  Vitals:   04/14/20 0749  TempSrc: Oral  PainSc: 0-No pain         Complications: No complications documented.

## 2020-04-14 NOTE — Progress Notes (Signed)
*  PRELIMINARY RESULTS* Echocardiogram Echocardiogram Transesophageal has been performed.  Melinda [name] 04/14/2020, 8:28 AM

## 2020-04-14 NOTE — Anesthesia Preprocedure Evaluation (Signed)
Anesthesia Evaluation  Patient identified by MRN, date of birth, ID band Patient awake    Reviewed: Allergy & Precautions, H&P , NPO status , Patient's Chart, lab work & pertinent test results, reviewed documented beta blocker date and time   Airway Mallampati: II   Neck ROM: full    Dental  (+) Poor Dentition, Partial Upper   Pulmonary neg pulmonary ROS, Current Smoker,    Pulmonary exam normal        Cardiovascular Exercise Tolerance: Poor hypertension, On Medications + CAD and + Past MI  Normal cardiovascular exam Rhythm:regular Rate:Normal     Neuro/Psych PSYCHIATRIC DISORDERS Anxiety Depression  Neuromuscular disease    GI/Hepatic Neg liver ROS, GERD  Medicated,  Endo/Other  negative endocrine ROSdiabetes, Type 1, Insulin Dependent  Renal/GU negative Renal ROS  negative genitourinary   Musculoskeletal   Abdominal   Peds  Hematology negative hematology ROS (+)   Anesthesia Other Findings Past Medical History: No date: Anxiety No date: Atrial fibrillation (HCC) No date: Atrial flutter (HCC) No date: Coronary artery disease     Comment:  a. 06/2014 NSTEMI s/p CABG x 4 (LIMA to LAD, VG to Diag,               VG to OM, VG to PDA). No date: Depression No date: Diabetic neuropathy (HCC) No date: GERD (gastroesophageal reflux disease) No date: HTN (hypertension) No date: Hyperlipidemia LDL goal <70 No date: IDDM (insulin dependent diabetes mellitus) No date: Ischemic cardiomyopathy     Comment:  a. 06/2014 Echo: EF 40-45%, HK of entire inferolateral               and inferior myocardium c/w infarct of RCA/LCx, GR2DD,               mild MR 2016: Myocardial infarction ([place]) No date: OA (osteoarthritis) of knee No date: Obesity No date: Osteoarthritis No date: Osteomyelitis ([place]) No date: Peripheral neuropathy No date: Peripheral vascular disease ([place])     Comment:  a. 09/2016 Periph Angio: CTO R popliteal, CTO  L prox/mid               SFA->Med Rx; b. 05/2017 PTA: LSFA (Viabahn covered stent x              2), DEB to L Post Tibial; c. 06/2017 ABI: R 0.44, L 1.05. No date: Tobacco abuse Past Surgical History: 10/27/2016: ABDOMINAL AORTOGRAM W/LOWER EXTREMITY; N/A     Comment:  Procedure: Abdominal Aortogram w/Lower Extremity;                Surgeon: Nelva Bush, MD;  Location: [facility]               [facility];  Service: Cardiovascular;  Laterality: N/A; 10/21/2015: AMPUTATION TOE; Left     Comment:  Procedure: AMPUTATION TOE;  Surgeon: Sharlotte Alamo, DPM;                Location: [facility];  Service: Podiatry;  Laterality:               Left; 05/15/2017: AMPUTATION TOE; Left     Comment:  Procedure: AMPUTATION LEFT GREAT TOE;  Surgeon: Sharlotte Alamo, DPM;  Location: [facility];  Service: Podiatry;                Laterality: Left; No date: AORTIC VALVE REPLACEMENT (AVR)/CORONARY ARTERY BYPASS  GRAFTING (CABG)  No date: BREAST BIOPSY 06/2014: CARDIAC CATHETERIZATION     Comment:  95% stenosis mLAD, occlusion ostial OM1, 70% stenosis               LCx, 95% stenosis mRCA, EF 45%. 06/08/2014: CORONARY ARTERY BYPASS GRAFT; N/A     Comment:  Procedure: CORONARY ARTERY BYPASS GRAFTING (CABG);                Surgeon: Grace Isaac, MD;  Location: [facility];                Service: Open Heart Surgery;  Laterality: N/A;  Times 4               using left internal mammary artery to LAD artery and               endoscopically harvested bilateral saphenous vein to               Obtuse Marginal, Diagonal and Posterior Descending               coronary arteries. 06/2014: CT ABD W & PELVIS WO CM     Comment:  nl liver, gallbladder, spleen, mild diverticular               changes, no bowel wall inflammation, appendix nl, no               hernia, no other sig abnormalities 02/09/2020: GASTRIC ROUX-EN-Y; N/A     Comment:  Procedure: LAPAROSCOPIC ROUX-EN-Y GASTRIC BYPASS WITH               UPPER ENDOSCOPY;   Surgeon: Johnathan Hausen, MD;                Location: [facility];  Service: General;  Laterality: N/A; 05/23/2017: LOWER EXTREMITY ANGIOGRAPHY; Left     Comment:  Procedure: LOWER EXTREMITY ANGIOGRAPHY;  Surgeon: Algernon Huxley, MD;  Location: [facility];  Service:               Cardiovascular;  Laterality: Left; 05/28/2017: LOWER EXTREMITY ANGIOGRAPHY; Left     Comment:  Procedure: LOWER EXTREMITY ANGIOGRAPHY;  Surgeon: Algernon Huxley, MD;  Location: [facility];  Service:               Cardiovascular;  Laterality: Left; 12/16/2018: LOWER EXTREMITY ANGIOGRAPHY; Left     Comment:  Procedure: LOWER EXTREMITY ANGIOGRAPHY;  Surgeon: Algernon Huxley, MD;  Location: [facility];  Service:               Cardiovascular;  Laterality: Left; 12/23/2018: LOWER EXTREMITY ANGIOGRAPHY; Right     Comment:  Procedure: LOWER EXTREMITY ANGIOGRAPHY;  Surgeon: Algernon Huxley, MD;  Location: [facility];  Service:               Cardiovascular;  Laterality: Right; 06/08/2014: TEE WITHOUT CARDIOVERSION; N/A     Comment:  Procedure: TRANSESOPHAGEAL ECHOCARDIOGRAM (TEE);                Surgeon: Grace Isaac, MD;  Location: [facility];  Service: Open Heart Surgery;  Laterality: N/A; 12/2011: TOE AMPUTATION; Right     Comment:  rt middle toe 02/09/2020: UPPER GI ENDOSCOPY; N/A     Comment:  Procedure: UPPER GI ENDOSCOPY;  Surgeon: Johnathan Hausen, MD;  Location: [facility];  Service: General;                Laterality: N/A; 06/2014: US ECHOCARDIOGRAPHY     Comment:  EF 50-55%, HK of inf/post/inferolat walls, Ao sclerosis   Reproductive/Obstetrics negative OB ROS                             Anesthesia Physical Anesthesia Plan  ASA: IV  Anesthesia Plan: General   Post-op Pain Management:    Induction:   PONV Risk Score and Plan:   Airway Management Planned:   Additional  Equipment:   Intra-op Plan:   Post-operative Plan:   Informed Consent: I have reviewed the patients History and Physical, chart, labs and discussed the procedure including the risks, benefits and alternatives for the proposed anesthesia with the patient or authorized representative who has indicated his/her understanding and acceptance.     Dental Advisory Given  Plan Discussed with: CRNA  Anesthesia Plan Comments:         Anesthesia Quick Evaluation

## 2020-04-14 NOTE — Progress Notes (Signed)
Transesophageal Echocardiogram :  Indication: Atrial flutter, typical Requesting/ordering  physician: Esmond Plants  Procedure: Benzocaine spray x2 and 2 mls x 2 of viscous lidocaine were given orally to provide local anesthesia to the oropharynx. The patient was positioned supine on the left side, bite block provided. The patient was moderately sedated with the doses of versed and fentanyl as detailed below.  Using digital technique an omniplane probe was advanced into the distal esophagus without incident.   Moderate in general anesthesia performed by anesthesia department: Dr. Andree Elk   See report in [facility]  for complete details: In brief, transgastric imaging revealed normal LV function with no RWMAs and no mural apical thrombus.  .  Estimated ejection fraction was 55%.  Right sided cardiac chambers were normal with no evidence of pulmonary hypertension.  Imaging of the septum showed no ASD or VSD Bubble study was negative for shunt 2D and color flow confirmed no PFO  Mild tricuspid valve regurgitation, mild MR  The LA was well visualized in orthogonal views.  There was no spontaneous contrast and no thrombus in the LA and LA appendage   The descending thoracic aorta had no  mural aortic debris with no evidence of aneurysmal dilation or dissection Mild aortic atherosclerosis noted in the aortic arch, descending aorta  Following transesophageal echo, cardioversion was performed   Ida Rogue 04/14/2020 9:06 AM

## 2020-04-15 MED ORDER — VARENICLINE TARTRATE 1 MG PO TABS: 1.0000 mg | ORAL_TABLET | Freq: Two times a day (BID) | ORAL | 0 refills | Status: AC

## 2020-04-15 NOTE — Telephone Encounter (Signed)
Spoke to pt, notified I have sent in next 8 weeks of Chantix 1mg  BID. Pt has 1 month starter pack now. This will complete 12 week course. Pt has no further questions and is appreciative.

## 2020-04-15 NOTE — Telephone Encounter (Signed)
Yes, please send enough for a full 12 weeks. Thank you!

## 2020-04-16 ENCOUNTER — Other Ambulatory Visit: Payer: Self-pay

## 2020-04-16 ENCOUNTER — Encounter: Payer: Self-pay | Admitting: Dietician

## 2020-04-16 ENCOUNTER — Encounter: Payer: Medicare HMO | Attending: Physician Assistant | Admitting: Dietician

## 2020-04-16 ENCOUNTER — Telehealth: Payer: Self-pay | Admitting: *Deleted

## 2020-04-16 DIAGNOSIS — Z6838 Body mass index (BMI) 38.0-38.9, adult: Secondary | ICD-10-CM | POA: Insufficient documentation

## 2020-04-16 NOTE — Patient Instructions (Signed)
   Reduce water intake to 1/2 gallon, or 64 oz per day. This along with some other fluids like broth, V8, and protein shakes will provide enough fluids and allow you to feel enough hunger for meals.   Work on eating every 3-4 hours during the day. Add at least 1/2 - 1 protein shake back in at lunch time or for a snack.

## 2020-04-16 NOTE — Telephone Encounter (Signed)
Attempted to call pt with lab results. No answer. LMOM TCB.  

## 2020-04-16 NOTE — Progress Notes (Signed)
Nutrition Therapy for Post-Operative Bariatric Diet Follow-up visit:  2 months post-op Gastric Bypass Surgery  Medical Nutrition Therapy:  Appt start time: 1330     end time: 2330  Anthropometrics: Weight: 264.6lbs Height: 5'8.5"  Date 01/26/20 02/20/20 04/16/20  BMI 44.23 41.98 39.65  Weight (lbs) 295.2 280.2 264.6  Skeletal muscle (lbs)     *     *     *  % body fat     *     *     *  * unable to use body composition scale as patient in wearing an orthopedic boot   Clinical: Medications: reconciled list in medical record Supplementation: flintstones complete multivitamin 2x daily + calcium 1x daily Health/ medical history changes: recent atrial fibrillation/ atrial flutter GI symptoms: N/V/D/C: no nausea or vomiting, constipation has been improving with stool softener Dumping Syndrome: no Hair loss: yes  Dietary/ Lifestyle Progress: . Patient feels she is not doing well on the bariatric diet. Ate extra at Thanksgiving, and has continued to have cravings for other foods since. Had [place], collard greens, small portion dressing, then had leftover greens and had pain and constipation for 4 days.  . Has gallon jug filled with water, drinks throughout the day, and often finishes by the end of the day.Has had abdominal pain and dry heaves when drinking too much water at once.  . Activity continues to gradually increase; wearing boot for wound on left foot . She is smoking about 1/2 pack daily, but started on Chantix 4 days ago on path to quitting.   Dietary recall: Eating pattern: 2 meals, 0-1 snack Dining out:  Breakfast: 2 scrambled eggs with deli [place], occ V8 juice Snack: none, drinks water Lunch: deli [place] if hungry Snack: none,   Dinner: deli [place], cheese, sometimes with veg if available Snack: none  Fluid intake: 64-128oz water, chicken broth Estimated total protein intake: 35-45grams daily Bariatric diet adherence:  . Using straws: no . Drinking fluids during  meals: no . Carbonated beverages: no  Recent physical activity:  Increasing general activity, walking around the home   Nutrition Intervention:   . Reviewed progress since previous visit. Marland Kitchen Discussed ways to manage food cravings. . Protein and energy intake seem low per patient report; advised resuming 1 protein shake daily and working towards pattern of eating every 3-4 hours. Discussed reducing water intake to 64oz to promote more hunger for food or at least nourishing beverages. . Instructed on gradual advancement of diet to include more vegetables including some high fiber starchy veg (only after consuming protein and low-carb veg), followed by low sugar fruits until 6 months post-op.  Nutritional Diagnosis:  [place]-3.3 Overweight/obesity As related to history of excess calories and inactivity.  As evidenced by patient with current BMI of 38.6, following bariatric diet for ongoing weight loss after bariatric surgery.  Teaching Method Utilized:  Visual Auditory Hands on  Materials provided:  Phase 5 bariatric diet handout  Rebounding from [facility] (AND)  Visit summary with goals and instructions  Learning Readiness:   Change in progress  Barriers to learning/adherence to lifestyle change: none  Demonstrated degree of understanding via:  Teach Back      Plan: . Return for 6 month post-op MNT on 08/13/20 at 1:30pm

## 2020-04-16 NOTE — Telephone Encounter (Signed)
-----   Message from Arvil Chaco, [name] sent at 04/15/2020  2:37 PM EST ----- Blood counts stable from previous labs. Renal function and electrolytes stable. Cholesterol labs show all numbers at goal. Thyroid labs normal. Great labs!

## 2020-04-19 ENCOUNTER — Ambulatory Visit: Payer: Medicare HMO | Admitting: Physician Assistant

## 2020-04-19 NOTE — Telephone Encounter (Signed)
Results reviewed with patient and she verbalized understanding with no further questions at this time.  °

## 2020-04-20 NOTE — Interval H&P Note (Signed)
History and Physical Interval Note:  04/20/2020 9:39 AM  Melinda [name]  has presented today for surgery, with the diagnosis of TEE with Cardioversion   Atrial Flutter  Ok'd Dr Andree Elk  - Anesthesia.  The various methods of treatment have been discussed with the patient and family. After consideration of risks, benefits and other options for treatment, the patient has consented to  Procedure(s): CARDIOVERSION with TEE (N/A) TRANSESOPHAGEAL ECHOCARDIOGRAM (TEE) (N/A) as a surgical intervention.  The patient's history has been reviewed, patient examined, no change in status, stable for surgery.  I have reviewed the patient's chart and labs.  Questions were answered to the patient's satisfaction.     Ida Rogue

## 2020-04-20 NOTE — H&P (Signed)
H&P Addendum, pre-TEE and cardioversion  Patient was seen and evaluated prior to TEE and cardioversion procedure Symptoms, prior testing details again confirmed with the patient Patient examined, no significant change from prior exam Lab work reviewed in detail personally by myself Patient understands risk and benefit of the procedure, willing to proceed  Signed, Esmond Plants, MD, Ph.D [facility]

## 2020-04-22 ENCOUNTER — Encounter: Payer: Medicare HMO | Admitting: Physician Assistant

## 2020-04-22 ENCOUNTER — Other Ambulatory Visit: Payer: Self-pay

## 2020-04-22 DIAGNOSIS — E11621 Type 2 diabetes mellitus with foot ulcer: Secondary | ICD-10-CM | POA: Diagnosis not present
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
## 2020-04-26 ENCOUNTER — Telehealth: Payer: Self-pay | Admitting: Cardiovascular Disease

## 2020-04-26 NOTE — Telephone Encounter (Signed)
STAT if HR is under 50 or over 120 (normal HR is 60-100 beats per minute)  1) What is your heart rate? Unsure at this time  2) Do you have a log of your heart rate readings (document readings)? 140 on 12/26, 139 this morning  3) Do you have any other symptoms? headaches

## 2020-04-26 NOTE — Anesthesia Postprocedure Evaluation (Signed)
Anesthesia Post Note  Patient: Melinda [name]  Procedure(s) Performed: CARDIOVERSION with TEE (N/A ) TRANSESOPHAGEAL ECHOCARDIOGRAM (TEE) (N/A )  Patient location during evaluation: PACU Anesthesia Type: General Level of consciousness: awake and alert Pain management: pain level controlled Vital Signs Assessment: post-procedure vital signs reviewed and stable Respiratory status: spontaneous breathing, nonlabored ventilation, respiratory function stable and patient connected to nasal cannula oxygen Cardiovascular status: blood pressure returned to baseline and stable Postop Assessment: no apparent nausea or vomiting Anesthetic complications: no   No complications documented.   Last Vitals:  Vitals:   04/14/20 0845 04/14/20 0900  BP: 103/83 139/84  Pulse:    Resp: 16 (!) 74  Temp:    SpO2: 100% 99%    Last Pain:  Vitals:   04/14/20 0749  TempSrc: Oral  PainSc: 0-No pain                 Yevette Edwards

## 2020-04-27 MED ORDER — AMIODARONE HCL 200 MG PO TABS: ORAL_TABLET | ORAL | 0 refills | Status: DC

## 2020-04-27 NOTE — Telephone Encounter (Signed)
Left voicemail message for patient to call back so that we can schedule appointment in 1 to 2 weeks with provider or APP.

## 2020-04-27 NOTE — Telephone Encounter (Signed)
Dr. Mariah Milling made aware, pt unable to come to office today. Per Dr. Mariah Milling, will begin Amiodarone 400mg  BID x 7 days THEN 200mg  BID. Then pt needs to follow up in clinic 1-2 weeks. I spoke to pt and verified that pt HAS been taking Eliquis BID and has not missed any doses. Notified pt, likely back in abnormal rhythm with HR results pt has reported, and Dr. recc to start Amiodarone and that she will receive a call back today to get her scheduled for 1-2 week f/u. Pt verbalized understanding and also verifies she will continue Eliquis as instructed. Rx sent to pharamacy.

## 2020-04-27 NOTE — Telephone Encounter (Signed)
Patient calling.  Patient states her HR this morning was 133 BPM.  Patient states she has taken her medication and it has not helped her heart rate  Patient would like a call back.

## 2020-04-27 NOTE — Telephone Encounter (Signed)
Spoke to pt. She had cardioversion 04/14/20. States since yesterday she "has not felt well" and her HR was 133 this morning. While pt on phone, asked to take BP/HR @ 1230 BP 121/84 HR 118. Pt takes Metoprolol Tartrate 50mg  BID. She did take this morning. Pt has not been given instructions to take PRN dose for breakthrough tachycardia. Offered pt to come in office as we have an opening to see Dr. at 3:40 pm today to make sure pt is not back in arrhythmia. Pt refused appt. Notified pt I will discuss with Dr. Mariah Milling and we will return call. Pt verbalized understanding.

## 2020-04-27 NOTE — Telephone Encounter (Signed)
Spoke with patient and reviewed appointment for May 14, 2020 at 4 pm. She confirmed this date and time with no further questions at this time.

## 2020-04-29 ENCOUNTER — Telehealth: Payer: Self-pay | Admitting: Cardiovascular Disease

## 2020-04-29 DIAGNOSIS — I480 Paroxysmal atrial fibrillation: Secondary | ICD-10-CM

## 2020-04-29 MED ORDER — APIXABAN 5 MG PO TABS: 5.0000 mg | ORAL_TABLET | Freq: Two times a day (BID) | ORAL | 0 refills | Status: DC

## 2020-04-29 NOTE — Addendum Note (Signed)
Addended by: Cheree Ditto on: 04/29/2020 04:15 PM   Modules accepted: Orders

## 2020-04-29 NOTE — Telephone Encounter (Signed)
°*  STAT* If patient is at the pharmacy, call can be transferred to refill team.   1. Which medications need to be refilled? (please list name of each medication and dose if known) Eliquis 5 mg bid  2. Which pharmacy/location (including street and city if local pharmacy) is medication to be sent to? [facility] [address]  3. Do they need a 30 day or 90 day supply? 1 week supply

## 2020-04-29 NOTE — Telephone Encounter (Signed)
error 

## 2020-04-29 NOTE — Telephone Encounter (Signed)
Refill request-Patient only has one day of medication left. Waiting on mail order and states it should be arriving Monday. No samples on hand to give. Thank you!

## 2020-04-29 NOTE — Telephone Encounter (Signed)
30 day refill sent to patient's local [facility]

## 2020-05-03 ENCOUNTER — Other Ambulatory Visit: Payer: Self-pay | Admitting: *Deleted

## 2020-05-03 ENCOUNTER — Ambulatory Visit: Payer: Medicare HMO | Admitting: Physician Assistant

## 2020-05-03 DIAGNOSIS — I480 Paroxysmal atrial fibrillation: Secondary | ICD-10-CM

## 2020-05-03 MED ORDER — APIXABAN 5 MG PO TABS: 5.0000 mg | ORAL_TABLET | Freq: Two times a day (BID) | ORAL | 6 refills | Status: DC

## 2020-05-03 NOTE — Telephone Encounter (Signed)
Pt's age 65, wt 120 kg, SCr 1.17, CrCl 89.6, last ov w/ JV 04/06/20.

## 2020-05-11 ENCOUNTER — Encounter: Payer: Medicare HMO | Attending: Physician Assistant | Admitting: Physician Assistant

## 2020-05-11 ENCOUNTER — Telehealth: Payer: Self-pay | Admitting: Cardiovascular Disease

## 2020-05-11 ENCOUNTER — Other Ambulatory Visit: Payer: Self-pay

## 2020-05-11 DIAGNOSIS — Z8616 Personal history of COVID-19: Secondary | ICD-10-CM | POA: Insufficient documentation

## 2020-05-11 DIAGNOSIS — E11621 Type 2 diabetes mellitus with foot ulcer: Secondary | ICD-10-CM | POA: Insufficient documentation

## 2020-05-11 DIAGNOSIS — L97529 Non-pressure chronic ulcer of other part of left foot with unspecified severity: Secondary | ICD-10-CM | POA: Diagnosis present

## 2020-05-11 DIAGNOSIS — L97522 Non-pressure chronic ulcer of other part of left foot with fat layer exposed: Secondary | ICD-10-CM | POA: Diagnosis not present

## 2020-05-11 DIAGNOSIS — Z794 Long term (current) use of insulin: Secondary | ICD-10-CM | POA: Insufficient documentation

## 2020-05-11 DIAGNOSIS — Z89422 Acquired absence of other left toe(s): Secondary | ICD-10-CM | POA: Insufficient documentation

## 2020-05-11 DIAGNOSIS — Z89412 Acquired absence of left great toe: Secondary | ICD-10-CM | POA: Diagnosis not present

## 2020-05-11 DIAGNOSIS — Z89421 Acquired absence of other right toe(s): Secondary | ICD-10-CM | POA: Diagnosis not present

## 2020-05-11 DIAGNOSIS — L84 Corns and callosities: Secondary | ICD-10-CM | POA: Diagnosis not present

## 2020-05-11 DIAGNOSIS — E1151 Type 2 diabetes mellitus with diabetic peripheral angiopathy without gangrene: Secondary | ICD-10-CM | POA: Insufficient documentation

## 2020-05-11 DIAGNOSIS — E1142 Type 2 diabetes mellitus with diabetic polyneuropathy: Secondary | ICD-10-CM | POA: Diagnosis not present

## 2020-05-11 NOTE — Telephone Encounter (Signed)
Pt c/o Shortness Of Breath: STAT if SOB developed within the last 24 hours or pt is noticeably SOB on the phone  1. Are you currently SOB (can you hear that pt is SOB on the phone)? Yes   2. How long have you been experiencing SOB? 3 or 4 days   3. Are you SOB when sitting or when up moving around? Both   4. Are you currently experiencing any other symptoms? Headache
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
## 2020-05-11 NOTE — Telephone Encounter (Signed)
Call transferred directly from scheduling for active SOB. I spoke with the patient.  She describes: - SOB at rest x 3-4 days - intermittent headache that will last ~ 10 minutes then gone - irregular heart beat as confirmed by the provider at her wound check appointment today (HR- 114 bpm) - HR's since last week have been 140 bpm and "slowly trending down" - SBP: 113-125  I have confirmed with the patient that: - she is taking eliquis 5 mg BID (no missed doses) - she is taking amiodarone 200 mg BID - left foot/ ankle swelling - abdominal swelling - unable to weigh due to no scale at home - she takes lasix 20 mg QD PRN (took this last week x 3 days in a row with no noticeable change in her breathing)  She had a DCCV on 04/14/21 and she reports this lasted about 3 days. She had newly diagnosed atrial flutter at the time of a lexiscan on 04/01/20.  She had COVID-19 just prior to Thanksgiving per her report. She denies any fever today.  She is due for a follow up appointment on 05/14/20 with Dr. Rockey Situ.  I have advised the patient, that it sounds like she may be retaining fluid due to her heart arrhythmia. She is aware I will review with Dr. Rockey Situ and call her back with further recommendations.  The patient voices understanding and is agreeable.

## 2020-05-11 NOTE — Telephone Encounter (Signed)
Reviewed the message below with Dr. Rockey Situ. Per MD, will need to see in clinic, ideally with Dr. Quentin Ore tomorrow and if Dr. Quentin Ore cannot see her, then we can add on to Dr. Donivan Scull schedule and he can consult with Dr. Quentin Ore.  No availability on Dr. Mardene Speak schedule tomorrow. I have called the patient and made her aware of MD recommendations.  I have offered to move up her appointment with Dr. Rockey Situ to tomorrow at 9:20 am.  Per the patient, she needs 2-3 days notice to find a ride. I have advised her we can keep her appointment on Friday 1/14 as scheduled with Dr. Rockey Situ. I have asked her to (starting tomorrow) take lasix 20 mg once daily until she is seen on Friday.  She has also been advised if her SOB worsens, she should report to the ER for further evaluation and possible treatment. She voices understanding and is agreeable.  She states she will call 911 if symptoms worsen.   I have also notified the patient that Dr. Rockey Situ most likely will discuss with her referral to EP for further options to treat her rhythm.

## 2020-05-12 NOTE — Progress Notes (Signed)
MATTISYN, CARDONA ([id number]) Visit Report for 05/11/2020 Arrival Information Details Patient Name: SIRENA, RIDDLE. Date of Service: 05/11/2020 12:45 PM Medical Record Number: [id number] Patient Account Number: [id number] Date of Birth/Sex: 1955-06-09 (64 y.o. F) Treating RN: [name] Primary Care Kindred Reidinger: [name] Other Clinician: Referring Shakerra Red: [name] Treating Norie Latendresse/Extender: Skipper Cliche in Treatment: 21 Visit Information History Since Last Visit Added or deleted any medications: No Patient Arrived: Ambulatory Any new allergies or adverse reactions: No Arrival Time: 12:55 Had a fall or experienced change in No Accompanied By: self activities of daily living that may affect Transfer Assistance: None risk of falls: Patient Identification Verified: Yes Signs or symptoms of abuse/neglect since last visito No Secondary Verification Process Completed: Yes Hospitalized since last visit: No Patient Requires Transmission-Based No Implantable device outside of the clinic excluding No Precautions: cellular tissue based products placed in the center Patient Has Alerts: Yes since last visit: Patient Alerts: 10/28/19 ABI: R 1.05 Has Dressing in Place as Prescribed: Yes L .60 Pain Present Now: No Electronic Signature(s) Signed: 05/11/2020 4:43:31 PM By: Lorine Bears RCP, RRT, CHT Entered By: Lorine Bears on 05/11/2020 13:00:54 [name] ([id number]) -------------------------------------------------------------------------------- Encounter Discharge Information Details Patient Name: [name]. Date of Service: 05/11/2020 12:45 PM Medical Record Number: [id number] Patient Account Number: [id number] Date of Birth/Sex: 06-20-1955 (64 y.o. F) Treating RN: [name] Primary Care Jesten Cappuccio: [name] Other Clinician: Referring Enmanuel Zufall: [name] Treating Lynda Wanninger/Extender: Skipper Cliche in Treatment: 21 Encounter  Discharge Information Items Post Procedure Vitals Discharge Condition: Stable Temperature (F): 98.1 Ambulatory Status: Ambulatory Pulse (bpm): 114 Discharge Destination: Home Respiratory Rate (breaths/min): 20 Transportation: Private Auto Blood Pressure (mmHg): 138/94 Accompanied By: self Schedule Follow-up Appointment: Yes Clinical Summary of Care: Electronic Signature(s) Signed: 05/12/2020 8:10:24 AM By: [name], [name], RN, [name], [name] RN, [name] Entered By: [name], [name], RN, [name], [name] on 05/11/2020 14:35:41 [name] ([id number]) -------------------------------------------------------------------------------- Lower Extremity Assessment Details Patient Name: ZAKARIAH, DEJARNETTE. Date of Service: 05/11/2020 12:45 PM Medical Record Number: [id number] Patient Account Number: [id number] Date of Birth/Sex: February 20, 1956 (64 y.o. F) Treating RN: [name] Primary Care Saxon Crosby: [name] Other Clinician: Referring Siria Calandro: [name] Treating Jeromie Gainor/Extender: Skipper Cliche in Treatment: 21 Vascular Assessment Pulses: Dorsalis Pedis Palpable: [Left:Yes] Electronic Signature(s) Signed: 05/12/2020 8:10:24 AM By: [name], [name], RN, [name], [name] RN, [name] Entered By: [name], [name], RN, [name], [name] on 05/11/2020 13:30:25 [name] ([id number]) -------------------------------------------------------------------------------- Multi Wound Chart Details Patient Name: [name]. Date of Service: 05/11/2020 12:45 PM Medical Record Number: [id number] Patient Account Number: [id number] Date of Birth/Sex: [birth year] (64 y.o. F) Treating RN: [name] Primary Care Averey Trompeter: [name] Other Clinician: Referring Marcello Tuzzolino: [name] Treating Mazal Ebey/Extender: Skipper Cliche in Treatment: 21 Vital Signs Height(in): 69 Pulse(bpm): 114 Weight(lbs): Blood Pressure(mmHg): 138/94 Body Mass Index(BMI): Temperature(F): 98.1 Respiratory Rate(breaths/min): 20 Photos: [N/A:N/A] Wound Location:  Left, Plantar Toe Great N/A N/A Wounding Event: Gradually Appeared N/A N/A Primary Etiology: Diabetic Wound/Ulcer of the Lower N/A N/A Extremity Comorbid History: Cataracts, Coronary Artery Disease, N/A N/A Hypertension, Myocardial Infarction, Peripheral Venous Disease, Type II Diabetes, Osteoarthritis, Osteomyelitis, Neuropathy Date Acquired: 12/02/2019 N/A N/A Weeks of Treatment: 21 N/A N/A Wound Status: Open N/A N/A Measurements L x W x D (cm) 0.1x0.1x0.1 N/A N/A Area (cm) : 0.008 N/A N/A Volume (cm) : 0.001 N/A N/A % Reduction in Area: 99.70% N/A N/A % Reduction in Volume: 100.00% N/A N/A Classification: Grade 2 N/A N/A Exudate Amount: None Present  N/A N/A Wound Margin: Thickened N/A N/A Granulation Amount: None Present (0%) N/A N/A Necrotic Amount: None Present (0%) N/A N/A Exposed Structures: Fat Layer (Subcutaneous Tissue): N/A N/A Yes Fascia: No Tendon: No Muscle: No Joint: No Bone: No Epithelialization: Large (67-100%) N/A N/A Treatment Notes Electronic Signature(s) Signed: 05/12/2020 8:10:24 AM By: [name], [name], RN, [name], [name] RN, [name] Entered By: [name], [name], RN, [name], [name] on 05/11/2020 13:38:16 [name] ([id number]) -------------------------------------------------------------------------------- Gravity Details Patient Name: [name]. Date of Service: 05/11/2020 12:45 PM Medical Record Number: [id number] Patient Account Number: [id number] Date of Birth/Sex: 27-Jan-1956 (64 y.o. F) Treating RN: [name] Primary Care Zienna Ahlin: [name] Other Clinician: Referring Anusha Claus: [name] Treating Jamiya Nims/Extender: Skipper Cliche in Treatment: 21 Active Inactive Necrotic Tissue Nursing Diagnoses: Impaired tissue integrity related to necrotic/devitalized tissue Goals: Necrotic/devitalized tissue will be minimized in the wound bed Date Initiated: 12/16/2019 Target Resolution Date: 12/26/2019 Goal Status:  Active Interventions: Assess patient pain level pre-, during and post procedure and prior to discharge Treatment Activities: Excisional debridement : 12/16/2019 Notes: Orientation to the Wound Care Program Nursing Diagnoses: Knowledge deficit related to the wound healing center program Goals: Patient/caregiver will verbalize understanding of the [facility] Date Initiated: 12/16/2019 Target Resolution Date: 12/26/2019 Goal Status: Active Interventions: Provide education on orientation to the wound center Notes: Peripheral Neuropathy Nursing Diagnoses: Knowledge deficit related to disease process and management of peripheral neurovascular dysfunction Potential alteration in peripheral tissue perfusion (select prior to confirmation of diagnosis) Goals: Patient/caregiver will verbalize understanding of disease process and disease management Date Initiated: 12/16/2019 Target Resolution Date: 12/26/2019 Goal Status: Active Interventions: Provide education on Management of Neuropathy and Related Ulcers Treatment Activities: Patient referred for customized footwear/offloading : 12/16/2019 Notes: Pressure Nursing Diagnoses: TIKA, HANNIS ([id number]) Knowledge deficit related to causes and risk factors for pressure ulcer development Knowledge deficit related to management of pressures ulcers Goals: Patient will remain free from development of additional pressure ulcers Date Initiated: 12/16/2019 Target Resolution Date: 12/26/2019 Goal Status: Active Interventions: Provide education on pressure ulcers Notes: Wound/Skin Impairment Nursing Diagnoses: Impaired tissue integrity Goals: Ulcer/skin breakdown will have a volume reduction of 30% by week 4 Date Initiated: 12/16/2019 Target Resolution Date: 01/16/2020 Goal Status: Active Interventions: Assess patient/caregiver ability to obtain necessary supplies Assess ulceration(s) every visit Treatment Activities: Skin  care regimen initiated : 12/16/2019 Notes: Electronic Signature(s) Signed: 05/12/2020 8:10:24 AM By: [name], [name], RN, [name], [name] RN, [name] Entered By: [name], [name], RN, [name], [name] on 05/11/2020 13:37:20 [name] ([id number]) -------------------------------------------------------------------------------- Pain Assessment Details Patient Name: [name]. Date of Service: 05/11/2020 12:45 PM Medical Record Number: [id number] Patient Account Number: [id number] Date of Birth/Sex: 10-Jun-1955 (64 y.o. F) Treating RN: [name] Primary Care Deval Mroczka: [name] Other Clinician: Referring Samira Acero: [name] Treating Jeovanni Heuring/Extender: Skipper Cliche in Treatment: 21 Active Problems Location of Pain Severity and Description of Pain Patient Has Paino No Site Locations Pain Management and Medication Current Pain Management: Notes Patient denies pain at this time. Electronic Signature(s) Signed: 05/12/2020 8:10:24 AM By: [name], [name], RN, [name], [name] RN, [name] Entered By: [name], [name], RN, [name], [name] on 05/11/2020 13:25:13 [name] ([id number]) -------------------------------------------------------------------------------- Patient/Caregiver Education Details Patient Name: [name] Date of Service: 05/11/2020 12:45 PM Medical Record Number: [id number] Patient Account Number: [id number] Date of Birth/Gender: 1956-02-17 (64 y.o. F) Treating RN: [name] Primary Care Physician: [name] Other Clinician: Referring Physician: [name] Treating Physician/Extender: Skipper Cliche in Treatment: 21  Education Assessment Education Provided To: Patient Education Topics Provided Wound Debridement: Handouts: Wound Debridement Methods: Demonstration, Explain/Verbal Responses: State content correctly Wound/Skin Impairment: Handouts: Caring for Your Ulcer Methods: Demonstration, Explain/Verbal Responses: State content correctly Electronic Signature(s) Signed: 05/12/2020  8:10:24 AM By: [name], [name], RN, [name], [name] RN, [name] Entered By: [name], [name], RN, [name], [name] on 05/11/2020 13:50:05 [name] ([id number]) -------------------------------------------------------------------------------- Wound Assessment Details Patient Name: LOYS, CWIKLA. Date of Service: 05/11/2020 12:45 PM Medical Record Number: [id number] Patient Account Number: [id number] Date of Birth/Sex: 1956/01/07 (64 y.o. F) Treating RN: [name] Primary Care Shloime Keilman: [name] Other Clinician: Referring Gunner Iodice: [name] Treating Dempsy Damiano/Extender: Skipper Cliche in Treatment: 21 Wound Status Wound Number: 5 Primary Diabetic Wound/Ulcer of the Lower Extremity Etiology: Wound Location: Left, Plantar Toe Great Wound Open Wounding Event: Gradually Appeared Status: Date Acquired: 12/02/2019 Comorbid Cataracts, Coronary Artery Disease, Hypertension, Weeks Of Treatment: 21 History: Myocardial Infarction, Peripheral Venous Disease, Type II Clustered Wound: No Diabetes, Osteoarthritis, Osteomyelitis, Neuropathy Photos Wound Measurements Length: (cm) 0.3 Width: (cm) 0.3 Depth: (cm) 0.4 Area: (cm) 0.071 Volume: (cm) 0.028 % Reduction in Area: 97.2% % Reduction in Volume: 98.8% Epithelialization: Large (67-100%) Tunneling: No Undermining: No Wound Description Classification: Grade 2 Foul O Wound Margin: Thickened Slough Exudate Amount: None Present dor After Cleansing: No /Fibrino No Wound Bed Granulation Amount: None Present (0%) Exposed Structure Necrotic Amount: None Present (0%) Fascia Exposed: No Fat Layer (Subcutaneous Tissue) Exposed: Yes Tendon Exposed: No Muscle Exposed: No Joint Exposed: No Bone Exposed: No Treatment Notes Wound #5 (Toe Great) Wound Laterality: Plantar, Left Cleanser Peri-Wound Care Topical Primary Dressing Prisma 4.34 (in) Diliberto, Gweneth E. ([id number]) Quantity: 1 Discharge Instruction: Moisten w/normal saline or sterile water; Cover  wound as directed. Do not remove from wound bed. Secondary Dressing Gauze Quantity: 1 Discharge Instruction: As directed: dry, moistened with saline or moistened with Dakins Solution Secured With Conforming Stretch Gauze Bandage 4x75 (in/in) Quantity: 1 Discharge Instruction: Apply as directed Compression Wrap Compression Stockings Add-Ons Electronic Signature(s) Signed: 05/12/2020 8:10:24 AM By: [name], [name], RN, [name], [name] RN, [name] Entered By: [name], [name], RN, [name], [name] on 05/11/2020 [place], Brandon. ([id number]) -------------------------------------------------------------------------------- Vitals Details Patient Name: [name]. Date of Service: 05/11/2020 12:45 PM Medical Record Number: [id number] Patient Account Number: [id number] Date of Birth/Sex: 1955/07/27 (64 y.o. F) Treating RN: [name] Primary Care Madaleine Simmon: [name] Other Clinician: Referring Christon Gallaway: [name] Treating Halei Hanover/Extender: Skipper Cliche in Treatment: 21 Vital Signs Time Taken: 12:55 Temperature (F): 98.1 Height (in): 69 Pulse (bpm): 114 Respiratory Rate (breaths/min): 20 Blood Pressure (mmHg): 138/94 Reference Range: 80 - 120 mg / dl Electronic Signature(s) Signed: 05/11/2020 4:43:31 PM By: Lorine Bears RCP, RRT, CHT Entered By: Becky Sax, Amado Nash on 05/11/2020 13:01:21

## 2020-05-13 NOTE — Progress Notes (Unsigned)
Cardiology Office Note  Date:  05/14/2020   ID:  Melinda [name], DOB Aug 05, 1955, MRN [id number]  PCP:  Donnie Coffin, MD   Chief Complaint  Patient presents with  . Other    Patient c.o SOB and Heart Racing. Meds reviewed verbally with patient.     HPI:  65 y.o. female with h/o CAD s/p 4 vessel CABG on 06/08/2014  poorly controlled IDDM,  HTN,  ongoing tobacco abuse, 2 cig  obesity,  anxiety,  peripheral neuropathy  admitted to [facility] from 2/1-06/03/14 for NSTEMI.  After undergoing cardiac cath on 2/3 that showed severe 3 vessel disease she was transferred to [facility] from 2/3-2/13 for . 4 vessel CABG (LIMA to LAD, SVG to Diagonal, SVG to OM, and SVG to PDA) She presents today for follow-up in the clinic for her coronary artery disease, PAD, smoking, diabetes, atrial flutter  She had COVID-19 just prior to Thanksgiving per her report.  newly diagnosed atrial flutter at the time of a lexiscan on 04/01/20.   Underwent TEE cardioversion April 14, 2020 for atrial flutter Recurrence of her arrhythmia 1 week later Started on amiodarone load at the end of December 2021 Started having lower extremity swelling, shortness of breath Started on Lasix several days ago with follow-up today She continues on the amiodarone 200 twice daily with Eliquis 5 twice daily  In follow-up today remains short of breath, EKG confirms atrial flutter rate 111 bpm Again interested in restoring normal sinus rhythm  Has worsening leg swelling, thigh swelling, abdominal bloating Has been taking Lasix 40 daily.  Reports not much urination  EKG personally reviewed by myself on todays visit Shows atrial flutter rate 111 bpm  Other past medical history reviewed  PAD history, interventions on her legs August 2020  Aug 2020 --Percutaneous transluminal angioplasty of the anterior tibial artery with 3 mm diameter by 10 cm length angioplasty balloon -- Percutaneous transluminal angioplasty of the left distal SFA  with 5 mm diameter by 8 cm length Lutonix drug-coated angioplasty balloon -- Viabahn stent placement to the distal left SFA with 6 mm diameter by 5 cm length stent for greater than 50% residual stenosis after angioplasty  Repeat intervention 1 week later --Percutaneous transluminal angioplasty of right anterior tibial artery with 3 mm diameter by 22 cm length angioplasty balloon and 4 mm diameter by 6 cm Lutonix drug-coated angioplasty balloon in the proximal segment --Percutaneous transluminal angioplasty of the right tibioperoneal trunk and peroneal artery with 3 mm diameter by 22 cm length angioplasty balloon --Percutaneous transluminal angioplasty of the right SFA and popliteal arteries with a 5 mm diameter by 30 cm length and 5 mm diameter by 10 cm length Lutonix drug-coated angioplasty balloons  In follow-up she reports no claudication symptoms in her legs  previous toe amputation May 28, 2017 operative note from Dr. Lucky Cowboy Reporting PAD with ulceration and gangrene left foot Angioplasty of left SFA and popliteal artery Covered stent x2 to left SFA Angioplasty left posterior tibial artery    presented to [facility] ED on 06/01/14 with  chest tightness,  Cardiac cath showed 95% stenosis mLAD, occlusion ostial OM1, 70% stenosis LCx, 95% stenosis mRCA, EF 45%. She underwent successful cabg on 06/08/2014 at [facility].  Echo at [facility] showed EF 40-45%,  HK of entire inferior and inferolateral wall c/w infarction of RCA or LCx,    PMH:   has a past medical history of Anxiety, Atrial fibrillation ([place]), Atrial flutter ([place]), Coronary artery disease, Depression, Diabetic neuropathy ([place]),  GERD (gastroesophageal reflux disease), HTN (hypertension), Hyperlipidemia LDL goal <70, IDDM (insulin dependent diabetes mellitus), Ischemic cardiomyopathy, Myocardial infarction ([place]) (2016), OA (osteoarthritis) of knee, Obesity, Osteoarthritis, Osteomyelitis ([place]), Peripheral neuropathy, Peripheral vascular disease ([place]),  and Tobacco abuse.  PSH:    Past Surgical History:  Procedure Laterality Date  . ABDOMINAL AORTOGRAM W/LOWER EXTREMITY N/A 10/27/2016   Procedure: Abdominal Aortogram w/Lower Extremity;  Surgeon: Nelva Bush, MD;  Location: [place] [facility];  Service: Cardiovascular;  Laterality: N/A;  . AMPUTATION TOE Left 10/21/2015   Procedure: AMPUTATION TOE;  Surgeon: Sharlotte Alamo, DPM;  Location: [facility];  Service: Podiatry;  Laterality: Left;  . AMPUTATION TOE Left 05/15/2017   Procedure: AMPUTATION LEFT GREAT TOE;  Surgeon: Sharlotte Alamo, DPM;  Location: [facility];  Service: Podiatry;  Laterality: Left;  . AORTIC VALVE REPLACEMENT (AVR)/CORONARY ARTERY BYPASS GRAFTING (CABG)    . BREAST BIOPSY    . CARDIAC CATHETERIZATION  06/2014   95% stenosis mLAD, occlusion ostial OM1, 70% stenosis LCx, 95% stenosis mRCA, EF 45%.  Marland Kitchen CARDIOVERSION N/A 04/14/2020   Procedure: CARDIOVERSION with TEE;  Surgeon: Minna Merritts, MD;  Location: [facility] OR[facility];  Service: Cardiovascular;  Laterality: N/A;  . CORONARY ARTERY BYPASS GRAFT N/A 06/08/2014   Procedure: CORONARY ARTERY BYPASS GRAFTING (CABG);  Surgeon: Grace Isaac, MD;  Location: [place];  Service: Open Heart Surgery;  Laterality: N/A;  Times 4 using left internal mammary artery to LAD artery and endoscopically harvested bilateral saphenous vein to Obtuse Marginal, Diagonal and Posterior Descending coronary arteries.  . CT ABD W & PELVIS WO CM  06/2014   nl liver, gallbladder, spleen, mild diverticular changes, no bowel wall inflammation, appendix nl, no hernia, no other sig abnormalities  . GASTRIC ROUX-EN-Y N/A 02/09/2020   Procedure: LAPAROSCOPIC ROUX-EN-Y GASTRIC BYPASS WITH UPPER ENDOSCOPY;  Surgeon: Johnathan Hausen, MD;  Location: [facility];  Service: General;  Laterality: N/A;  . LOWER EXTREMITY ANGIOGRAPHY Left 05/23/2017   Procedure: LOWER EXTREMITY ANGIOGRAPHY;  Surgeon: Algernon Huxley, MD;  Location: [place] [facility];  Service: Cardiovascular;   Laterality: Left;  . LOWER EXTREMITY ANGIOGRAPHY Left 05/28/2017   Procedure: LOWER EXTREMITY ANGIOGRAPHY;  Surgeon: Algernon Huxley, MD;  Location: [place] [facility];  Service: Cardiovascular;  Laterality: Left;  . LOWER EXTREMITY ANGIOGRAPHY Left 12/16/2018   Procedure: LOWER EXTREMITY ANGIOGRAPHY;  Surgeon: Algernon Huxley, MD;  Location: [place] [facility];  Service: Cardiovascular;  Laterality: Left;  . LOWER EXTREMITY ANGIOGRAPHY Right 12/23/2018   Procedure: LOWER EXTREMITY ANGIOGRAPHY;  Surgeon: Algernon Huxley, MD;  Location: [place] [facility];  Service: Cardiovascular;  Laterality: Right;  . TEE WITHOUT CARDIOVERSION N/A 06/08/2014   Procedure: TRANSESOPHAGEAL ECHOCARDIOGRAM (TEE);  Surgeon: Grace Isaac, MD;  Location: [place];  Service: Open Heart Surgery;  Laterality: N/A;  . TEE WITHOUT CARDIOVERSION N/A 04/14/2020   Procedure: TRANSESOPHAGEAL ECHOCARDIOGRAM (TEE);  Surgeon: Minna Merritts, MD;  Location: [facility];  Service: Cardiovascular;  Laterality: N/A;  . TOE AMPUTATION Right 12/2011   rt middle toe  . UPPER GI ENDOSCOPY N/A 02/09/2020   Procedure: UPPER GI ENDOSCOPY;  Surgeon: Johnathan Hausen, MD;  Location: [facility];  Service: General;  Laterality: N/A;  . US ECHOCARDIOGRAPHY  06/2014   EF 50-55%, HK of inf/post/inferolat walls, Ao sclerosis    Current Outpatient Medications on File Prior to Visit  Medication Sig Dispense Refill  . ACCU-CHEK AVIVA PLUS test strip 1 each 3 (three) times daily.    Marland Kitchen  Accu-Chek Softclix Lancets lancets 1 each 3 (three) times daily.    . Alcohol Swabs (B-D SINGLE USE SWABS REGULAR) PADS Apply topically.    Marland Kitchen amiodarone (PACERONE) 200 MG tablet Take 2 tablets (400 mg total) by mouth 2 (two) times daily for 7 days, THEN 1 tablet (200 mg total) 2 (two) times daily for 23 days. 74 tablet 0  . apixaban (ELIQUIS) 5 MG TABS tablet Take 1 tablet (5 mg total) by mouth 2 (two) times daily. 60 tablet 6  . atorvastatin (LIPITOR) 80 MG tablet Take 1 tablet  (80 mg total) by mouth daily at 6 PM. 90 tablet 0  . Cholecalciferol (VITAMIN D3) 50 MCG (2000 UT) TABS Take 2,000 Units by mouth daily.    . clobetasol (TEMOVATE) 0.05 % external solution Patient to mix entire bottle with 1 jar of CeraVe and apply twice a day x 1 month then as needed. Avoid face, groin, underarms. 50 mL 0  . docusate sodium (COLACE) 100 MG capsule Take 100 mg by mouth 2 (two) times daily.    . DROPLET PEN NEEDLES 31G X 6 MM MISC     . DULoxetine (CYMBALTA) 60 MG capsule Take 60 mg by mouth daily.     . Eflornithine HCl 13.9 % cream Apply to upper lip, chin and neck twice a day. 45 g 2  . Emollient (CERAVE EX) Apply 1 application topically in the morning and at bedtime.    . ferrous sulfate 325 (65 FE) MG tablet Take 325 mg by mouth daily.    . furosemide (LASIX) 20 MG tablet Take 1 tablet (20 mg total) by mouth daily as needed (fluid retention.). 90 tablet 0  . insulin aspart (NOVOLOG) 100 UNIT/ML injection Inject 0-20 Units into the skin every 4 (four) hours. 10 mL 11  . lidocaine (LIDODERM) 5 % Place 1 patch onto the skin daily as needed (PAIN.).     Marland Kitchen metoprolol tartrate (LOPRESSOR) 50 MG tablet Take 1 tablet (50 mg total) by mouth 2 (two) times daily. 90 tablet 1  . Multiple Vitamins-Minerals (MULTIVITAMIN WITH MINERALS) tablet Take 2 tablets by mouth in the morning and at bedtime.    . ondansetron (ZOFRAN-ODT) 4 MG disintegrating tablet Take 1 tablet (4 mg total) by mouth every 6 (six) hours as needed for nausea or vomiting. 20 tablet 0  . oxybutynin (DITROPAN) 5 MG tablet Take 5 mg by mouth 2 (two) times daily.     . pantoprazole (PROTONIX) 40 MG tablet Take 1 tablet (40 mg total) by mouth daily. 90 tablet 0  . potassium chloride SA (KLOR-CON) 20 MEQ tablet TAKE 1 TABLET (20 MEQ TOTAL) BY MOUTH DAILY AS NEEDED. 90 tablet 3  . pregabalin (LYRICA) 100 MG capsule Take 100 mg by mouth 3 (three) times daily.     Marland Kitchen tiZANidine (ZANAFLEX) 4 MG tablet Take 4 mg by mouth 3 (three)  times daily.    . varenicline (CHANTIX STARTING MONTH PAK) 0.5 MG X 11 & 1 MG X 42 tablet Take one 0.5 mg tablet by mouth once daily for 3 days, then increase to one 0.5 mg tablet twice daily for 4 days, then increase to one 1 mg tablet twice daily. 53 tablet 0  . varenicline (CHANTIX) 1 MG tablet Take 1 tablet (1 mg total) by mouth 2 (two) times daily. 112 tablet 0   No current facility-administered medications on file prior to visit.     Allergies:   Patient has no known allergies.  Social History:  The patient  reports that she has been smoking cigarettes. She has a 20.00 pack-year smoking history. She has never used smokeless tobacco. She reports previous drug use. She reports that she does not drink alcohol.   Family History:   family history includes Asthma in an other family member; CAD in her father and mother; Diabetes in her father and mother; Hypertension in her mother; Sickle cell trait in an other family member.    Review of Systems: Review of Systems  Constitutional: Negative.   HENT: Negative.   Respiratory: Positive for shortness of breath.   Cardiovascular: Positive for leg swelling.  Gastrointestinal: Negative.   Musculoskeletal: Positive for back pain.  Neurological: Negative.   Psychiatric/Behavioral: Negative.   All other systems reviewed and are negative.   PHYSICAL EXAM: VS:  BP 110/72 (BP Location: Right Arm, Patient Position: Sitting, Cuff Size: Normal)   Pulse (!) 111   Ht 5' 8.5" (1.74 m)   Wt 266 lb (120.7 kg)   BMI 39.86 kg/m  , BMI Body mass index is 39.86 kg/m. Constitutional:  oriented to person, place, and time. No distress.  HENT:  Head: Grossly normal Eyes:  no discharge. No scleral icterus.  Neck: No JVD, no carotid bruits  Cardiovascular: Regular, tachycardic, no murmurs appreciated Pulmonary/Chest: Clear to auscultation bilaterally, no wheezes or rails Abdominal: Soft.  no distension.  no tenderness.  Musculoskeletal: Normal range of  motion Neurological:  normal muscle tone. Coordination normal. No atrophy Skin: Skin warm and dry Psychiatric: normal affect, pleasant  Recent Labs: 04/13/2020: ALT 19; BUN 18; Creatinine, Ser 1.17; Hemoglobin 11.3; Platelets 282; Potassium 4.0; Sodium 142; TSH 1.122    Lipid Panel Lab Results  Component Value Date   CHOL 74 04/13/2020   HDL 33 (L) 04/13/2020   LDLCALC 23 04/13/2020   TRIG 89 04/13/2020      Wt Readings from Last 3 Encounters:  05/14/20 266 lb (120.7 kg)  04/16/20 264 lb 9.6 oz (120 kg)  04/14/20 264 lb 8.8 oz (120 kg)      ASSESSMENT AND PLAN:  Coronary artery disease involving native coronary artery of native heart without angina pectoris -  Stressed the importance of aggressive diabetes control Tolerating her Lipitor, antiplatelet therapy Stressed importance of smoking cessation  Atrial flutter Prior TEE cardioversion successful but did not hold, had recurrent flutter Has completed amiodarone load, will continue 200 twice daily and set up cardioversion next week at her convenience We did discuss ablation if normal sinus rhythm does not hold on amiodarone  Essential hypertension -  Blood pressure is well controlled on today's visit. No changes made to the medications. Extra Lasix as below  Acute on chronic diastolic and systolic CHF Ejection fraction 35 to 40%, remains in flutter Recommend she take Lasix 40 daily with extra 40 after lunch for leg swelling She does have significant leg swelling on today's visit Excessive by arrhythmia  Ischemic cardiomyopathy -  Appears fluid up, the weight is not dramatically higher only several pounds In fact weight is down 30 pounds from 1 year ago through dietary changes Plan is to restore normal sinus rhythm  Claudication ([place]) -  PV procedures from August 2020 Stressed the need for smoking cessation  S/P CABG x 4 Denies anginal symptoms, stressed the need for smoking cessation Diabetes numbers better,  lipids at goal  Type 2 diabetes mellitus with other circulatory complication, with long-term current use of insulin (HCC) Hemoglobin A1c 6.6 Weight has dramatically improved  in the past year, 30 pounds She is following a stricter diet  Tobacco abuse Still smoking less than 1 pack/day  Long discussion concerning antiarrhythmics, atrial flutter, options available including cardioversion, ablation Discussed all of the above in detail Orders placed for cardioversion  Total encounter time more than 45 minutes  Greater than 50% was spent in counseling and coordination of care with the patient     No orders of the defined types were placed in this encounter.    Signed, Esmond Plants, M.D., Ph.D. 05/14/2020  [place], [place]

## 2020-05-13 NOTE — H&P (View-Only) (Signed)
Cardiology Office Note  Date:  05/14/2020   ID:  Melinda [name], DOB Aug 05, 1955, MRN [id number]  PCP:  Donnie Coffin, MD   Chief Complaint  Patient presents with  . Other    Patient c.o SOB and Heart Racing. Meds reviewed verbally with patient.     HPI:  65 y.o. female with h/o CAD s/p 4 vessel CABG on 06/08/2014  poorly controlled IDDM,  HTN,  ongoing tobacco abuse, 2 cig  obesity,  anxiety,  peripheral neuropathy  admitted to [facility] from 2/1-06/03/14 for NSTEMI.  After undergoing cardiac cath on 2/3 that showed severe 3 vessel disease she was transferred to [facility] from 2/3-2/13 for . 4 vessel CABG (LIMA to LAD, SVG to Diagonal, SVG to OM, and SVG to PDA) She presents today for follow-up in the clinic for her coronary artery disease, PAD, smoking, diabetes, atrial flutter  She had COVID-19 just prior to Thanksgiving per her report.  newly diagnosed atrial flutter at the time of a lexiscan on 04/01/20.   Underwent TEE cardioversion April 14, 2020 for atrial flutter Recurrence of her arrhythmia 1 week later Started on amiodarone load at the end of December 2021 Started having lower extremity swelling, shortness of breath Started on Lasix several days ago with follow-up today She continues on the amiodarone 200 twice daily with Eliquis 5 twice daily  In follow-up today remains short of breath, EKG confirms atrial flutter rate 111 bpm Again interested in restoring normal sinus rhythm  Has worsening leg swelling, thigh swelling, abdominal bloating Has been taking Lasix 40 daily.  Reports not much urination  EKG personally reviewed by myself on todays visit Shows atrial flutter rate 111 bpm  Other past medical history reviewed  PAD history, interventions on her legs August 2020  Aug 2020 --Percutaneous transluminal angioplasty of the anterior tibial artery with 3 mm diameter by 10 cm length angioplasty balloon -- Percutaneous transluminal angioplasty of the left distal SFA  with 5 mm diameter by 8 cm length Lutonix drug-coated angioplasty balloon -- Viabahn stent placement to the distal left SFA with 6 mm diameter by 5 cm length stent for greater than 50% residual stenosis after angioplasty  Repeat intervention 1 week later --Percutaneous transluminal angioplasty of right anterior tibial artery with 3 mm diameter by 22 cm length angioplasty balloon and 4 mm diameter by 6 cm Lutonix drug-coated angioplasty balloon in the proximal segment --Percutaneous transluminal angioplasty of the right tibioperoneal trunk and peroneal artery with 3 mm diameter by 22 cm length angioplasty balloon --Percutaneous transluminal angioplasty of the right SFA and popliteal arteries with a 5 mm diameter by 30 cm length and 5 mm diameter by 10 cm length Lutonix drug-coated angioplasty balloons  In follow-up she reports no claudication symptoms in her legs  previous toe amputation May 28, 2017 operative note from Dr. Lucky Cowboy Reporting PAD with ulceration and gangrene left foot Angioplasty of left SFA and popliteal artery Covered stent x2 to left SFA Angioplasty left posterior tibial artery    presented to [facility] ED on 06/01/14 with  chest tightness,  Cardiac cath showed 95% stenosis mLAD, occlusion ostial OM1, 70% stenosis LCx, 95% stenosis mRCA, EF 45%. She underwent successful cabg on 06/08/2014 at [facility].  Echo at [facility] showed EF 40-45%,  HK of entire inferior and inferolateral wall c/w infarction of RCA or LCx,    PMH:   has a past medical history of Anxiety, Atrial fibrillation ([place]), Atrial flutter ([place]), Coronary artery disease, Depression, Diabetic neuropathy ([place]),  GERD (gastroesophageal reflux disease), HTN (hypertension), Hyperlipidemia LDL goal <70, IDDM (insulin dependent diabetes mellitus), Ischemic cardiomyopathy, Myocardial infarction ([place]) (2016), OA (osteoarthritis) of knee, Obesity, Osteoarthritis, Osteomyelitis ([place]), Peripheral neuropathy, Peripheral vascular disease ([place]),  and Tobacco abuse.  PSH:    Past Surgical History:  Procedure Laterality Date  . ABDOMINAL AORTOGRAM W/LOWER EXTREMITY N/A 10/27/2016   Procedure: Abdominal Aortogram w/Lower Extremity;  Surgeon: Nelva Bush, MD;  Location: [place] [facility];  Service: Cardiovascular;  Laterality: N/A;  . AMPUTATION TOE Left 10/21/2015   Procedure: AMPUTATION TOE;  Surgeon: Sharlotte Alamo, DPM;  Location: [facility];  Service: Podiatry;  Laterality: Left;  . AMPUTATION TOE Left 05/15/2017   Procedure: AMPUTATION LEFT GREAT TOE;  Surgeon: Sharlotte Alamo, DPM;  Location: [facility];  Service: Podiatry;  Laterality: Left;  . AORTIC VALVE REPLACEMENT (AVR)/CORONARY ARTERY BYPASS GRAFTING (CABG)    . BREAST BIOPSY    . CARDIAC CATHETERIZATION  06/2014   95% stenosis mLAD, occlusion ostial OM1, 70% stenosis LCx, 95% stenosis mRCA, EF 45%.  Marland Kitchen CARDIOVERSION N/A 04/14/2020   Procedure: CARDIOVERSION with TEE;  Surgeon: Minna Merritts, MD;  Location: [facility] OR[facility];  Service: Cardiovascular;  Laterality: N/A;  . CORONARY ARTERY BYPASS GRAFT N/A 06/08/2014   Procedure: CORONARY ARTERY BYPASS GRAFTING (CABG);  Surgeon: Grace Isaac, MD;  Location: [place];  Service: Open Heart Surgery;  Laterality: N/A;  Times 4 using left internal mammary artery to LAD artery and endoscopically harvested bilateral saphenous vein to Obtuse Marginal, Diagonal and Posterior Descending coronary arteries.  . CT ABD W & PELVIS WO CM  06/2014   nl liver, gallbladder, spleen, mild diverticular changes, no bowel wall inflammation, appendix nl, no hernia, no other sig abnormalities  . GASTRIC ROUX-EN-Y N/A 02/09/2020   Procedure: LAPAROSCOPIC ROUX-EN-Y GASTRIC BYPASS WITH UPPER ENDOSCOPY;  Surgeon: Johnathan Hausen, MD;  Location: [facility];  Service: General;  Laterality: N/A;  . LOWER EXTREMITY ANGIOGRAPHY Left 05/23/2017   Procedure: LOWER EXTREMITY ANGIOGRAPHY;  Surgeon: Algernon Huxley, MD;  Location: [place] [facility];  Service: Cardiovascular;   Laterality: Left;  . LOWER EXTREMITY ANGIOGRAPHY Left 05/28/2017   Procedure: LOWER EXTREMITY ANGIOGRAPHY;  Surgeon: Algernon Huxley, MD;  Location: [place] [facility];  Service: Cardiovascular;  Laterality: Left;  . LOWER EXTREMITY ANGIOGRAPHY Left 12/16/2018   Procedure: LOWER EXTREMITY ANGIOGRAPHY;  Surgeon: Algernon Huxley, MD;  Location: [place] [facility];  Service: Cardiovascular;  Laterality: Left;  . LOWER EXTREMITY ANGIOGRAPHY Right 12/23/2018   Procedure: LOWER EXTREMITY ANGIOGRAPHY;  Surgeon: Algernon Huxley, MD;  Location: [place] [facility];  Service: Cardiovascular;  Laterality: Right;  . TEE WITHOUT CARDIOVERSION N/A 06/08/2014   Procedure: TRANSESOPHAGEAL ECHOCARDIOGRAM (TEE);  Surgeon: Grace Isaac, MD;  Location: [place];  Service: Open Heart Surgery;  Laterality: N/A;  . TEE WITHOUT CARDIOVERSION N/A 04/14/2020   Procedure: TRANSESOPHAGEAL ECHOCARDIOGRAM (TEE);  Surgeon: Minna Merritts, MD;  Location: [facility];  Service: Cardiovascular;  Laterality: N/A;  . TOE AMPUTATION Right 12/2011   rt middle toe  . UPPER GI ENDOSCOPY N/A 02/09/2020   Procedure: UPPER GI ENDOSCOPY;  Surgeon: Johnathan Hausen, MD;  Location: [facility];  Service: General;  Laterality: N/A;  . US ECHOCARDIOGRAPHY  06/2014   EF 50-55%, HK of inf/post/inferolat walls, Ao sclerosis    Current Outpatient Medications on File Prior to Visit  Medication Sig Dispense Refill  . ACCU-CHEK AVIVA PLUS test strip 1 each 3 (three) times daily.    Marland Kitchen  Accu-Chek Softclix Lancets lancets 1 each 3 (three) times daily.    . Alcohol Swabs (B-D SINGLE USE SWABS REGULAR) PADS Apply topically.    Marland Kitchen amiodarone (PACERONE) 200 MG tablet Take 2 tablets (400 mg total) by mouth 2 (two) times daily for 7 days, THEN 1 tablet (200 mg total) 2 (two) times daily for 23 days. 74 tablet 0  . apixaban (ELIQUIS) 5 MG TABS tablet Take 1 tablet (5 mg total) by mouth 2 (two) times daily. 60 tablet 6  . atorvastatin (LIPITOR) 80 MG tablet Take 1 tablet  (80 mg total) by mouth daily at 6 PM. 90 tablet 0  . Cholecalciferol (VITAMIN D3) 50 MCG (2000 UT) TABS Take 2,000 Units by mouth daily.    . clobetasol (TEMOVATE) 0.05 % external solution Patient to mix entire bottle with 1 jar of CeraVe and apply twice a day x 1 month then as needed. Avoid face, groin, underarms. 50 mL 0  . docusate sodium (COLACE) 100 MG capsule Take 100 mg by mouth 2 (two) times daily.    . DROPLET PEN NEEDLES 31G X 6 MM MISC     . DULoxetine (CYMBALTA) 60 MG capsule Take 60 mg by mouth daily.     . Eflornithine HCl 13.9 % cream Apply to upper lip, chin and neck twice a day. 45 g 2  . Emollient (CERAVE EX) Apply 1 application topically in the morning and at bedtime.    . ferrous sulfate 325 (65 FE) MG tablet Take 325 mg by mouth daily.    . furosemide (LASIX) 20 MG tablet Take 1 tablet (20 mg total) by mouth daily as needed (fluid retention.). 90 tablet 0  . insulin aspart (NOVOLOG) 100 UNIT/ML injection Inject 0-20 Units into the skin every 4 (four) hours. 10 mL 11  . lidocaine (LIDODERM) 5 % Place 1 patch onto the skin daily as needed (PAIN.).     Marland Kitchen metoprolol tartrate (LOPRESSOR) 50 MG tablet Take 1 tablet (50 mg total) by mouth 2 (two) times daily. 90 tablet 1  . Multiple Vitamins-Minerals (MULTIVITAMIN WITH MINERALS) tablet Take 2 tablets by mouth in the morning and at bedtime.    . ondansetron (ZOFRAN-ODT) 4 MG disintegrating tablet Take 1 tablet (4 mg total) by mouth every 6 (six) hours as needed for nausea or vomiting. 20 tablet 0  . oxybutynin (DITROPAN) 5 MG tablet Take 5 mg by mouth 2 (two) times daily.     . pantoprazole (PROTONIX) 40 MG tablet Take 1 tablet (40 mg total) by mouth daily. 90 tablet 0  . potassium chloride SA (KLOR-CON) 20 MEQ tablet TAKE 1 TABLET (20 MEQ TOTAL) BY MOUTH DAILY AS NEEDED. 90 tablet 3  . pregabalin (LYRICA) 100 MG capsule Take 100 mg by mouth 3 (three) times daily.     Marland Kitchen tiZANidine (ZANAFLEX) 4 MG tablet Take 4 mg by mouth 3 (three)  times daily.    . varenicline (CHANTIX STARTING MONTH PAK) 0.5 MG X 11 & 1 MG X 42 tablet Take one 0.5 mg tablet by mouth once daily for 3 days, then increase to one 0.5 mg tablet twice daily for 4 days, then increase to one 1 mg tablet twice daily. 53 tablet 0  . varenicline (CHANTIX) 1 MG tablet Take 1 tablet (1 mg total) by mouth 2 (two) times daily. 112 tablet 0   No current facility-administered medications on file prior to visit.     Allergies:   Patient has no known allergies.  Social History:  The patient  reports that she has been smoking cigarettes. She has a 20.00 pack-year smoking history. She has never used smokeless tobacco. She reports previous drug use. She reports that she does not drink alcohol.   Family History:   family history includes Asthma in an other family member; CAD in her father and mother; Diabetes in her father and mother; Hypertension in her mother; Sickle cell trait in an other family member.    Review of Systems: Review of Systems  Constitutional: Negative.   HENT: Negative.   Respiratory: Positive for shortness of breath.   Cardiovascular: Positive for leg swelling.  Gastrointestinal: Negative.   Musculoskeletal: Positive for back pain.  Neurological: Negative.   Psychiatric/Behavioral: Negative.   All other systems reviewed and are negative.   PHYSICAL EXAM: VS:  BP 110/72 (BP Location: Right Arm, Patient Position: Sitting, Cuff Size: Normal)   Pulse (!) 111   Ht 5' 8.5" (1.74 m)   Wt 266 lb (120.7 kg)   BMI 39.86 kg/m  , BMI Body mass index is 39.86 kg/m. Constitutional:  oriented to person, place, and time. No distress.  HENT:  Head: Grossly normal Eyes:  no discharge. No scleral icterus.  Neck: No JVD, no carotid bruits  Cardiovascular: Regular, tachycardic, no murmurs appreciated Pulmonary/Chest: Clear to auscultation bilaterally, no wheezes or rails Abdominal: Soft.  no distension.  no tenderness.  Musculoskeletal: Normal range of  motion Neurological:  normal muscle tone. Coordination normal. No atrophy Skin: Skin warm and dry Psychiatric: normal affect, pleasant  Recent Labs: 04/13/2020: ALT 19; BUN 18; Creatinine, Ser 1.17; Hemoglobin 11.3; Platelets 282; Potassium 4.0; Sodium 142; TSH 1.122    Lipid Panel Lab Results  Component Value Date   CHOL 74 04/13/2020   HDL 33 (L) 04/13/2020   LDLCALC 23 04/13/2020   TRIG 89 04/13/2020      Wt Readings from Last 3 Encounters:  05/14/20 266 lb (120.7 kg)  04/16/20 264 lb 9.6 oz (120 kg)  04/14/20 264 lb 8.8 oz (120 kg)      ASSESSMENT AND PLAN:  Coronary artery disease involving native coronary artery of native heart without angina pectoris -  Stressed the importance of aggressive diabetes control Tolerating her Lipitor, antiplatelet therapy Stressed importance of smoking cessation  Atrial flutter Prior TEE cardioversion successful but did not hold, had recurrent flutter Has completed amiodarone load, will continue 200 twice daily and set up cardioversion next week at her convenience We did discuss ablation if normal sinus rhythm does not hold on amiodarone  Essential hypertension -  Blood pressure is well controlled on today's visit. No changes made to the medications. Extra Lasix as below  Acute on chronic diastolic and systolic CHF Ejection fraction 35 to 40%, remains in flutter Recommend she take Lasix 40 daily with extra 40 after lunch for leg swelling She does have significant leg swelling on today's visit Excessive by arrhythmia  Ischemic cardiomyopathy -  Appears fluid up, the weight is not dramatically higher only several pounds In fact weight is down 30 pounds from 1 year ago through dietary changes Plan is to restore normal sinus rhythm  Claudication ([place]) -  PV procedures from August 2020 Stressed the need for smoking cessation  S/P CABG x 4 Denies anginal symptoms, stressed the need for smoking cessation Diabetes numbers better,  lipids at goal  Type 2 diabetes mellitus with other circulatory complication, with long-term current use of insulin (HCC) Hemoglobin A1c 6.6 Weight has dramatically improved  in the past year, 30 pounds She is following a stricter diet  Tobacco abuse Still smoking less than 1 pack/day  Long discussion concerning antiarrhythmics, atrial flutter, options available including cardioversion, ablation Discussed all of the above in detail Orders placed for cardioversion  Total encounter time more than 45 minutes  Greater than 50% was spent in counseling and coordination of care with the patient     No orders of the defined types were placed in this encounter.    Signed, Esmond Plants, M.D., Ph.D. 05/14/2020  [place], [place]

## 2020-05-14 ENCOUNTER — Encounter: Payer: Self-pay | Admitting: Cardiovascular Disease

## 2020-05-14 ENCOUNTER — Ambulatory Visit: Payer: Medicare HMO | Admitting: Cardiovascular Disease

## 2020-05-14 ENCOUNTER — Other Ambulatory Visit: Payer: Self-pay

## 2020-05-14 VITALS — BP 110/72 | HR 111 | Ht 68.5 in | Wt 266.0 lb

## 2020-05-14 DIAGNOSIS — I2510 Atherosclerotic heart disease of native coronary artery without angina pectoris: Secondary | ICD-10-CM

## 2020-05-14 DIAGNOSIS — Z951 Presence of aortocoronary bypass graft: Secondary | ICD-10-CM

## 2020-05-14 DIAGNOSIS — I25118 Atherosclerotic heart disease of native coronary artery with other forms of angina pectoris: Secondary | ICD-10-CM

## 2020-05-14 DIAGNOSIS — I10 Essential (primary) hypertension: Secondary | ICD-10-CM

## 2020-05-14 DIAGNOSIS — I4892 Unspecified atrial flutter: Secondary | ICD-10-CM

## 2020-05-14 DIAGNOSIS — I739 Peripheral vascular disease, unspecified: Secondary | ICD-10-CM

## 2020-05-14 DIAGNOSIS — I480 Paroxysmal atrial fibrillation: Secondary | ICD-10-CM | POA: Diagnosis not present

## 2020-05-14 DIAGNOSIS — Z720 Tobacco use: Secondary | ICD-10-CM

## 2020-05-14 DIAGNOSIS — E785 Hyperlipidemia, unspecified: Secondary | ICD-10-CM

## 2020-05-14 MED ORDER — AMIODARONE HCL 200 MG PO TABS: 200.0000 mg | ORAL_TABLET | Freq: Two times a day (BID) | ORAL | 3 refills | Status: DC

## 2020-05-14 MED ORDER — FUROSEMIDE 20 MG PO TABS: 40.0000 mg | ORAL_TABLET | Freq: Two times a day (BID) | ORAL | 1 refills | Status: DC | PRN

## 2020-05-14 NOTE — Patient Instructions (Addendum)
Cardioversion next week Thursday or Friday with Makyna Niehoff   Medication Instructions:  Lasix 40 daily With extra 40 in the afternoon as needed for leg swelling, ABD swelling  Stay on amiodarone 200 mg twice a day  If you need a refill on your cardiac medications before your next appointment, please call your pharmacy.    Lab work: No new labs needed   If you have labs (blood work) drawn today and your tests are completely normal, you will receive your results only by: Marland Kitchen MyChart Message (if you have MyChart) OR . A paper copy in the mail If you have any lab test that is abnormal or we need to change your treatment, we will call you to review the results.   Testing/Procedures: No new testing needed   Follow-Up: At [facility], you and your health needs are our priority.  As part of our continuing mission to provide you with exceptional heart care, we have created designated Provider Care Teams.  These Care Teams include your primary Cardiologist (physician) and Advanced Practice Providers (APPs -  Physician Assistants and Nurse Practitioners) who all work together to provide you with the care you need, when you need it.  . You will need a follow up appointment in 1 month  . Providers on your designated Care Team:   . Murray Hodgkins, NP . Christell Faith, [name] . Marrianne Mood, [name]  Any Other Special Instructions Will Be Listed Below (If Applicable).  COVID-19 Vaccine Information can be found at: ShippingScam.co.uk For questions related to vaccine distribution or appointments, please email vaccine@[employer] .com or call [phone].

## 2020-05-17 ENCOUNTER — Ambulatory Visit: Payer: Medicare HMO | Admitting: Physician Assistant

## 2020-05-18 ENCOUNTER — Ambulatory Visit: Payer: Medicare HMO | Admitting: Cardiovascular Disease

## 2020-05-18 ENCOUNTER — Telehealth: Payer: Self-pay

## 2020-05-18 NOTE — Telephone Encounter (Signed)
Able to reach pt, Dr. Rockey Situ had spoken with pt last Friday of cardioversion, was able to get this schedule for this Friday, 1/21, arrive at 06:30am at [facility] and [place] (tomorrow) 8am-1pm for testing before procedure Friday. No labs at this time, last labs were Dec. DIET INSTRUCTIONS: Nothing to eat or drink after midnight except your medications with a sip of water. Must have a responsible person to drive you home (pt does not drive, will have family member or transportation services to get her home) and advise pt to bring a current list of your medications and current insurance cards. Pt verbalized understanding, reports has had this before. Will call back for any additional concerns or questions, reports will try to have everything arrange with transportation by Friday, right now d/t weather (ice/snow) transportation services was not open yesterday or this am, Melinda [name] will call back if half to cancel d/t transportation, but she stated she is planning to be there Friday and have COVID swab tomorrow.

## 2020-05-18 NOTE — Telephone Encounter (Signed)
Return pt's call, pt reports was able to arrange transportation for her cardioversion on Friday 1/21, arriving at 06:30am. Pt is concern for weather, they are calling for snow/ice Thursday evening into Friday. Advised pt they will call and have potential for reschedule based on weather. Pt would like a phone call Thursday afternoon to see if there is a chance her procedure would be cancel. Pt will have a phone call Thursday afternoon, pt verbalized understanding.

## 2020-05-18 NOTE — Telephone Encounter (Signed)
Patient calling Patient is concerned about there being possible bad weather on same day as procedure and would like to know if the hospital would cancel if so Informed patient that we would keep her updated if anything changes

## 2020-05-19 ENCOUNTER — Ambulatory Visit: Payer: Medicare HMO | Admitting: Cardiovascular Disease

## 2020-05-19 ENCOUNTER — Telehealth: Payer: Self-pay | Admitting: Cardiovascular Disease

## 2020-05-19 ENCOUNTER — Other Ambulatory Visit
Admission: RE | Admit: 2020-05-19 | Payer: Medicare HMO | Source: Ambulatory Visit | Attending: Neurosurgery | Admitting: Neurosurgery

## 2020-05-19 DIAGNOSIS — Z01812 Encounter for preprocedural laboratory examination: Secondary | ICD-10-CM

## 2020-05-19 DIAGNOSIS — I7025 Atherosclerosis of native arteries of other extremities with ulceration: Secondary | ICD-10-CM

## 2020-05-19 DIAGNOSIS — Z794 Long term (current) use of insulin: Secondary | ICD-10-CM

## 2020-05-19 DIAGNOSIS — E785 Hyperlipidemia, unspecified: Secondary | ICD-10-CM

## 2020-05-19 DIAGNOSIS — R739 Hyperglycemia, unspecified: Secondary | ICD-10-CM

## 2020-05-19 DIAGNOSIS — I2510 Atherosclerotic heart disease of native coronary artery without angina pectoris: Secondary | ICD-10-CM

## 2020-05-19 NOTE — Telephone Encounter (Signed)
Patient wants to reschedule procedure .  Please call.

## 2020-05-19 NOTE — Telephone Encounter (Signed)
Was able to reschedule Mrs. Guhl's cardioversion from this Fri 1/21 to the following Fri 1/28 (she was worried about impending weather on 1/21; snow). Cardiac scheduling was updated. Pt will have COVID pre-procedure swab on Tues 1/25 and CBC/BMP on same day. Pt requested A1C to be drawn as well, Dr. Rockey Situ approved, order placed. Pt reeducated on pre-procedure instructions, verbalized understanding. Otherwise all questions or concerns were address and no additional concerns at this time, will call back for anything further.

## 2020-05-20 ENCOUNTER — Ambulatory Visit: Payer: Medicare HMO | Admitting: Physician Assistant

## 2020-05-24 ENCOUNTER — Ambulatory Visit: Payer: Medicare HMO | Admitting: Physician Assistant

## 2020-05-25 ENCOUNTER — Other Ambulatory Visit
Admission: RE | Admit: 2020-05-25 | Payer: Medicare HMO | Source: Ambulatory Visit | Attending: Neurosurgery | Admitting: Neurosurgery

## 2020-05-26 ENCOUNTER — Other Ambulatory Visit
Admission: RE | Admit: 2020-05-26 | Discharge: 2020-05-26 | Disposition: A | Discharge status: Home / Self Care | Payer: Medicare HMO | Source: Ambulatory Visit | Attending: Cardiovascular Disease | Admitting: Cardiovascular Disease

## 2020-05-26 ENCOUNTER — Other Ambulatory Visit: Payer: Self-pay

## 2020-05-26 DIAGNOSIS — I7025 Atherosclerosis of native arteries of other extremities with ulceration: Secondary | ICD-10-CM | POA: Insufficient documentation

## 2020-05-26 DIAGNOSIS — E119 Type 2 diabetes mellitus without complications: Secondary | ICD-10-CM | POA: Insufficient documentation

## 2020-05-26 DIAGNOSIS — E785 Hyperlipidemia, unspecified: Secondary | ICD-10-CM

## 2020-05-26 DIAGNOSIS — Z20822 Contact with and (suspected) exposure to covid-19: Secondary | ICD-10-CM | POA: Insufficient documentation

## 2020-05-26 DIAGNOSIS — Z01812 Encounter for preprocedural laboratory examination: Secondary | ICD-10-CM

## 2020-05-26 DIAGNOSIS — R739 Hyperglycemia, unspecified: Secondary | ICD-10-CM

## 2020-05-26 DIAGNOSIS — Z794 Long term (current) use of insulin: Secondary | ICD-10-CM

## 2020-05-26 DIAGNOSIS — I2510 Atherosclerotic heart disease of native coronary artery without angina pectoris: Secondary | ICD-10-CM

## 2020-05-26 LAB — BASIC METABOLIC PANEL
Anion gap: 10 (ref 5–15)
BUN: 16 mg/dL (ref 8–23)
CO2: 26 mmol/L (ref 22–32)
Calcium: 9 mg/dL (ref 8.9–10.3)
Chloride: 101 mmol/L (ref 98–111)
Creatinine, Ser: 1.34 mg/dL — ABNORMAL HIGH (ref 0.44–1.00)
GFR, Estimated: 44 mL/min — ABNORMAL LOW (ref >60)
Glucose, Bld: 195 mg/dL — ABNORMAL HIGH (ref 70–99)
Potassium: 5 mmol/L (ref 3.5–5.1)
Sodium: 137 mmol/L (ref 135–145)

## 2020-05-26 LAB — CBC
HCT: 38 % (ref 36.0–46.0)
Hemoglobin: 12.2 g/dL (ref 12.0–15.0)
MCH: 23 pg — ABNORMAL LOW (ref 26.0–34.0)
MCHC: 32.1 g/dL (ref 30.0–36.0)
MCV: 71.6 fL — ABNORMAL LOW (ref 80.0–100.0)
Platelets: 295 10*3/uL (ref 150–400)
RBC: 5.31 MIL/uL — ABNORMAL HIGH (ref 3.87–5.11)
RDW: 21.2 % — ABNORMAL HIGH (ref 11.5–15.5)
WBC: 5.5 10*3/uL (ref 4.0–10.5)
nRBC: 0 % (ref 0.0–0.2)

## 2020-05-26 LAB — HEMOGLOBIN A1C
Hgb A1c MFr Bld: 8 % — ABNORMAL HIGH (ref 4.8–5.6)
Mean Plasma Glucose: 182.9 mg/dL

## 2020-05-26 LAB — SARS CORONAVIRUS 2 (TAT 6-24 HRS): SARS Coronavirus 2: NEGATIVE

## 2020-05-27 ENCOUNTER — Other Ambulatory Visit: Payer: Self-pay | Admitting: Cardiovascular Disease

## 2020-05-28 ENCOUNTER — Ambulatory Visit: Payer: Medicare HMO | Admitting: Anesthesiology

## 2020-05-28 ENCOUNTER — Encounter: Payer: Self-pay | Admitting: Cardiovascular Disease

## 2020-05-28 ENCOUNTER — Other Ambulatory Visit: Payer: Self-pay

## 2020-05-28 ENCOUNTER — Ambulatory Visit
Admission: RE | Admit: 2020-05-28 | Discharge: 2020-05-28 | Disposition: A | Discharge status: Home / Self Care | Payer: Medicare HMO | Attending: Cardiovascular Disease | Admitting: Cardiovascular Disease

## 2020-05-28 ENCOUNTER — Encounter
Admission: RE | Disposition: A | Discharge status: Home / Self Care | Payer: Self-pay | Source: Home / Self Care | Attending: Cardiovascular Disease

## 2020-05-28 DIAGNOSIS — Z7901 Long term (current) use of anticoagulants: Secondary | ICD-10-CM | POA: Insufficient documentation

## 2020-05-28 DIAGNOSIS — Z794 Long term (current) use of insulin: Secondary | ICD-10-CM | POA: Insufficient documentation

## 2020-05-28 DIAGNOSIS — E1151 Type 2 diabetes mellitus with diabetic peripheral angiopathy without gangrene: Secondary | ICD-10-CM | POA: Diagnosis not present

## 2020-05-28 DIAGNOSIS — I110 Hypertensive heart disease with heart failure: Secondary | ICD-10-CM | POA: Diagnosis not present

## 2020-05-28 DIAGNOSIS — Z79899 Other long term (current) drug therapy: Secondary | ICD-10-CM | POA: Diagnosis not present

## 2020-05-28 DIAGNOSIS — I5043 Acute on chronic combined systolic (congestive) and diastolic (congestive) heart failure: Secondary | ICD-10-CM | POA: Insufficient documentation

## 2020-05-28 DIAGNOSIS — F17210 Nicotine dependence, cigarettes, uncomplicated: Secondary | ICD-10-CM | POA: Insufficient documentation

## 2020-05-28 DIAGNOSIS — Z951 Presence of aortocoronary bypass graft: Secondary | ICD-10-CM | POA: Insufficient documentation

## 2020-05-28 DIAGNOSIS — I255 Ischemic cardiomyopathy: Secondary | ICD-10-CM | POA: Insufficient documentation

## 2020-05-28 DIAGNOSIS — I2510 Atherosclerotic heart disease of native coronary artery without angina pectoris: Secondary | ICD-10-CM | POA: Insufficient documentation

## 2020-05-28 DIAGNOSIS — I483 Typical atrial flutter: Secondary | ICD-10-CM

## 2020-05-28 DIAGNOSIS — I4891 Unspecified atrial fibrillation: Secondary | ICD-10-CM | POA: Insufficient documentation

## 2020-05-28 DIAGNOSIS — I4892 Unspecified atrial flutter: Secondary | ICD-10-CM | POA: Insufficient documentation

## 2020-05-28 HISTORY — PX: CARDIOVERSION: SHX1299

## 2020-05-28 LAB — GLUCOSE, CAPILLARY: Glucose-Capillary: 145 mg/dL — ABNORMAL HIGH (ref 70–99)

## 2020-05-28 SURGERY — CARDIOVERSION
Anesthesia: General

## 2020-05-28 MED ORDER — PROPOFOL 10 MG/ML IV BOLUS
INTRAVENOUS | Status: AC
  Filled 2020-05-28: qty 20

## 2020-05-28 MED ORDER — PROPOFOL 10 MG/ML IV BOLUS
INTRAVENOUS | Status: DC | PRN
  Administered 2020-05-28: 80 mg via INTRAVENOUS

## 2020-05-28 MED ORDER — SODIUM CHLORIDE 0.9 % IV SOLN: INTRAVENOUS | Status: DC | PRN

## 2020-05-28 NOTE — Anesthesia Preprocedure Evaluation (Signed)
Anesthesia Evaluation  Patient identified by MRN, date of birth, ID band Patient awake    Reviewed: Allergy & Precautions, H&P , NPO status , Patient's Chart, lab work & pertinent test results, reviewed documented beta blocker date and time   History of Anesthesia Complications Negative for: history of anesthetic complications  Airway Mallampati: II   Neck ROM: full    Dental  (+) Poor Dentition, Partial Upper, Dental Advidsory Given   Pulmonary shortness of breath (worsened by a fib) and with exertion, neg COPD, neg recent URI, Current Smoker and Patient abstained from smoking., former smoker,    Pulmonary exam normal        Cardiovascular Exercise Tolerance: Poor hypertension, On Medications (-) angina+ CAD, + Past MI, + CABG and + Peripheral Vascular Disease  + dysrhythmias Atrial Fibrillation (-) Valvular Problems/Murmurs Rhythm:regular Rate:Normal     Neuro/Psych neg Seizures PSYCHIATRIC DISORDERS Anxiety Depression  Neuromuscular disease    GI/Hepatic Neg liver ROS, GERD  Medicated,  Endo/Other  negative endocrine ROSdiabetes, Type 1, Insulin Dependent  Renal/GU negative Renal ROS  negative genitourinary   Musculoskeletal   Abdominal   Peds  Hematology negative hematology ROS (+)   Anesthesia Other Findings Past Medical History: No date: Anxiety No date: Atrial fibrillation (HCC) No date: Atrial flutter (HCC) No date: Coronary artery disease     Comment:  a. 06/2014 NSTEMI s/p CABG x 4 (LIMA to LAD, VG to Diag,               VG to OM, VG to PDA). No date: Depression No date: Diabetic neuropathy (HCC) No date: GERD (gastroesophageal reflux disease) No date: HTN (hypertension) No date: Hyperlipidemia LDL goal <70 No date: IDDM (insulin dependent diabetes mellitus) No date: Ischemic cardiomyopathy     Comment:  a. 06/2014 Echo: EF 40-45%, HK of entire inferolateral               and inferior myocardium c/w  infarct of RCA/LCx, GR2DD,               mild MR 2016: Myocardial infarction ([place]) No date: OA (osteoarthritis) of knee No date: Obesity No date: Osteoarthritis No date: Osteomyelitis ([place]) No date: Peripheral neuropathy No date: Peripheral vascular disease ([place])     Comment:  a. 09/2016 Periph Angio: CTO R popliteal, CTO L prox/mid               SFA->Med Rx; b. 05/2017 PTA: LSFA (Viabahn covered stent x              2), DEB to L Post Tibial; c. 06/2017 ABI: R 0.44, L 1.05. No date: Tobacco abuse Past Surgical History: 10/27/2016: ABDOMINAL AORTOGRAM W/LOWER EXTREMITY; N/A     Comment:  Procedure: Abdominal Aortogram w/Lower Extremity;                Surgeon: Nelva Bush, MD;  Location: [place] [facility] [facility];  Service: Cardiovascular;  Laterality: N/A; 10/21/2015: AMPUTATION TOE; Left     Comment:  Procedure: AMPUTATION TOE;  Surgeon: Sharlotte Alamo, DPM;                Location: [facility];  Service: Podiatry;  Laterality:               Left; 05/15/2017: AMPUTATION TOE; Left     Comment:  Procedure: AMPUTATION LEFT GREAT TOE;  Surgeon: Cleda Mccreedy,  Sherren Mocha, DPM;  Location: [facility];  Service: Podiatry;                Laterality: Left; No date: AORTIC VALVE REPLACEMENT (AVR)/CORONARY ARTERY BYPASS  GRAFTING (CABG) No date: BREAST BIOPSY 06/2014: CARDIAC CATHETERIZATION     Comment:  95% stenosis mLAD, occlusion ostial OM1, 70% stenosis               LCx, 95% stenosis mRCA, EF 45%. 06/08/2014: CORONARY ARTERY BYPASS GRAFT; N/A     Comment:  Procedure: CORONARY ARTERY BYPASS GRAFTING (CABG);                Surgeon: Grace Isaac, MD;  Location: [place];                Service: Open Heart Surgery;  Laterality: N/A;  Times 4               using left internal mammary artery to LAD artery and               endoscopically harvested bilateral saphenous vein to               Obtuse Marginal, Diagonal and Posterior Descending               coronary arteries. 06/2014: CT ABD W &  PELVIS WO CM     Comment:  nl liver, gallbladder, spleen, mild diverticular               changes, no bowel wall inflammation, appendix nl, no               hernia, no other sig abnormalities 02/09/2020: GASTRIC ROUX-EN-Y; N/A     Comment:  Procedure: LAPAROSCOPIC ROUX-EN-Y GASTRIC BYPASS WITH               UPPER ENDOSCOPY;  Surgeon: Johnathan Hausen, MD;                Location: [facility];  Service: General;  Laterality: N/A; 05/23/2017: LOWER EXTREMITY ANGIOGRAPHY; Left     Comment:  Procedure: LOWER EXTREMITY ANGIOGRAPHY;  Surgeon: Algernon Huxley, MD;  Location: [place] [facility];  Service:               Cardiovascular;  Laterality: Left; 05/28/2017: LOWER EXTREMITY ANGIOGRAPHY; Left     Comment:  Procedure: LOWER EXTREMITY ANGIOGRAPHY;  Surgeon: Algernon Huxley, MD;  Location: [place] [facility];  Service:               Cardiovascular;  Laterality: Left; 12/16/2018: LOWER EXTREMITY ANGIOGRAPHY; Left     Comment:  Procedure: LOWER EXTREMITY ANGIOGRAPHY;  Surgeon: Algernon Huxley, MD;  Location: [place] [facility];  Service:               Cardiovascular;  Laterality: Left; 12/23/2018: LOWER EXTREMITY ANGIOGRAPHY; Right     Comment:  Procedure: LOWER EXTREMITY ANGIOGRAPHY;  Surgeon: Algernon Huxley, MD;  Location: [place] [facility];  Service:               Cardiovascular;  Laterality: Right; 06/08/2014: TEE WITHOUT CARDIOVERSION; N/A  Comment:  Procedure: TRANSESOPHAGEAL ECHOCARDIOGRAM (TEE);                Surgeon: Grace Isaac, MD;  Location: [place];                Service: Open Heart Surgery;  Laterality: N/A; 12/2011: TOE AMPUTATION; Right     Comment:  rt middle toe 02/09/2020: UPPER GI ENDOSCOPY; N/A     Comment:  Procedure: UPPER GI ENDOSCOPY;  Surgeon: Johnathan Hausen, MD;  Location: [facility];  Service: General;                Laterality: N/A; 06/2014: US ECHOCARDIOGRAPHY     Comment:  EF 50-55%, HK of  inf/post/inferolat walls, Ao sclerosis   Reproductive/Obstetrics negative OB ROS                             Anesthesia Physical  Anesthesia Plan  ASA: IV  Anesthesia Plan: General   Post-op Pain Management:    Induction: Intravenous  PONV Risk Score and Plan: 3 and Propofol infusion and TIVA  Airway Management Planned: Natural Airway and Nasal Cannula  Additional Equipment:   Intra-op Plan:   Post-operative Plan:   Informed Consent: I have reviewed the patients History and Physical, chart, labs and discussed the procedure including the risks, benefits and alternatives for the proposed anesthesia with the patient or authorized representative who has indicated his/her understanding and acceptance.     Dental Advisory Given  Plan Discussed with: CRNA  Anesthesia Plan Comments:         Anesthesia Quick Evaluation

## 2020-05-28 NOTE — Anesthesia Postprocedure Evaluation (Signed)
Anesthesia Post Note  Patient: Melinda [name]  Procedure(s) Performed: CARDIOVERSION (N/A )  Patient location during evaluation: Spec[facility] Recovery Anesthesia Type: General Level of consciousness: awake and alert Pain management: pain level controlled Vital Signs Assessment: post-procedure vital signs reviewed and stable Respiratory status: spontaneous breathing, nonlabored ventilation, respiratory function stable and patient connected to nasal cannula oxygen Cardiovascular status: blood pressure returned to baseline and stable Postop Assessment: no apparent nausea or vomiting Anesthetic complications: no   No complications documented.   Last Vitals:  Vitals:   05/28/20 0830 05/28/20 0838  BP: 121/74 126/69  Pulse: 69 68  Resp: (!) 25 20  Temp:    SpO2: 95% 96%    Last Pain:  Vitals:   05/28/20 0838  TempSrc:   PainSc: 0-No pain                 Martha Clan

## 2020-05-28 NOTE — Discharge Instructions (Signed)
Electrical Cardioversion Electrical cardioversion is the delivery of a jolt of electricity to restore a normal rhythm to the heart. A rhythm that is too fast or is not regular keeps the heart from pumping well. In this procedure, sticky patches or metal paddles are placed on the chest to deliver electricity to the heart from a device. This procedure may be done in an emergency if:  There is low or no blood pressure as a result of the heart rhythm.  Normal rhythm must be restored as fast as possible to protect the brain and heart from further damage.  It may save a life. This may also be a scheduled procedure for irregular or fast heart rhythms that are not immediately life-threatening. Tell a health care provider about:  Any allergies you have.  All medicines you are taking, including vitamins, herbs, eye drops, creams, and over-the-counter medicines.  Any problems you or family members have had with anesthetic medicines.  Any blood disorders you have.  Any surgeries you have had.  Any medical conditions you have.  Whether you are pregnant or may be pregnant. What are the risks? Generally, this is a safe procedure. However, problems may occur, including:  Allergic reactions to medicines.  A blood clot that breaks free and travels to other parts of your body.  The possible return of an abnormal heart rhythm within hours or days after the procedure.  Your heart stopping (cardiac arrest). This is rare. What happens before the procedure? Medicines  Your health care provider may have you start taking: ? Blood-thinning medicines (anticoagulants) so your blood does not clot as easily. ? Medicines to help stabilize your heart rate and rhythm.  Ask your health care provider about: ? Changing or stopping your regular medicines. This is especially important if you are taking diabetes medicines or blood thinners. ? Taking medicines such as aspirin and ibuprofen. These medicines can  thin your blood. Do not take these medicines unless your health care provider tells you to take them. ? Taking over-the-counter medicines, vitamins, herbs, and supplements. General instructions  Follow instructions from your health care provider about eating or drinking restrictions.  Plan to have someone take you home from the hospital or clinic.  If you will be going home right after the procedure, plan to have someone with you for 24 hours.  Ask your health care provider what steps will be taken to help prevent infection. These may include washing your skin with a germ-killing soap. What happens during the procedure?  An IV will be inserted into one of your veins.  Sticky patches (electrodes) or metal paddles may be placed on your chest.  You will be given a medicine to help you relax (sedative).  An electrical shock will be delivered. The procedure may vary among health care providers and hospitals.   What can I expect after the procedure?  Your blood pressure, heart rate, breathing rate, and blood oxygen level will be monitored until you leave the hospital or clinic.  Your heart rhythm will be watched to make sure it does not change.  You may have some redness on the skin where the shocks were given. Follow these instructions at home:  Do not drive for 24 hours if you were given a sedative during your procedure.  Take over-the-counter and prescription medicines only as told by your health care provider.  Ask your health care provider how to check your pulse. Check it often.  Rest for 48 hours after the procedure   or as told by your health care provider.  Avoid or limit your caffeine use as told by your health care provider.  Keep all follow-up visits as told by your health care provider. This is important. Contact a health care provider if:  You feel like your heart is beating too quickly or your pulse is not regular.  You have a serious muscle cramp that does not go  away. Get help right away if:  You have discomfort in your chest.  You are dizzy or you feel faint.  You have trouble breathing or you are short of breath.  Your speech is slurred.  You have trouble moving an arm or leg on one side of your body.  Your fingers or toes turn cold or blue. Summary  Electrical cardioversion is the delivery of a jolt of electricity to restore a normal rhythm to the heart.  This procedure may be done right away in an emergency or may be a scheduled procedure if the condition is not an emergency.  Generally, this is a safe procedure.  After the procedure, check your pulse often as told by your health care provider. This information is not intended to replace advice given to you by your health care provider. Make sure you discuss any questions you have with your health care provider. Document Revised: 11/18/2018 Document Reviewed: 11/18/2018 Elsevier Patient Education  2021 [employer]  

## 2020-05-28 NOTE — Transfer of Care (Signed)
Immediate Anesthesia Transfer of Care Note  Patient: Melinda [name]  Procedure(s) Performed: CARDIOVERSION (N/A )  Patient Location: Short Stay  Anesthesia Type:General  Level of Consciousness: drowsy  Airway & Oxygen Therapy: Patient connected to nasal cannula oxygen  Post-op Assessment: Post -op Vital signs reviewed and stable  Post vital signs: stable  Last Vitals:  Vitals Value Taken Time  BP 118/67 05/28/20 0751  Temp    Pulse 53 05/28/20 0753  Resp 15 05/28/20 0753  SpO2 96 % 05/28/20 0753    Last Pain:  Vitals:   05/28/20 0716  TempSrc: Oral  PainSc: 0-No pain         Complications: No complications documented.

## 2020-05-28 NOTE — CV Procedure (Signed)
Cardioversion procedure note For atrial flutter, typical  Procedure Details:  Consent: Risks of procedure as well as the alternatives and risks of each were explained to the (patient/caregiver). Consent for procedure obtained.  Time Out: Verified patient identification, verified procedure, site/side was marked, verified correct patient position, special equipment/implants available, medications/allergies/relevent history reviewed, required imaging and test results available. Performed  Patient placed on cardiac monitor, pulse oximetry, supplemental oxygen as necessary.  Sedation given: propofol IV, Dr. Karenz Pacer pads placed anterior and posterior chest.   Cardioverted 1 time(s).  Cardioverted at  150 J. Synchronized biphasic Converted to NSR   Evaluation: Findings: Post procedure EKG shows: NSR Complications: None Patient did tolerate procedure well.  Time Spent Directly with the Patient:  45 minutes   Tim Emmilynn Marut, M.D., Ph.D. 

## 2020-05-30 NOTE — H&P (Signed)
H&P Addendum, pre-cardioversion  Patient was seen and evaluated prior to -cardioversion procedure Symptoms, prior testing details again confirmed with the patient Patient examined, no significant change from prior exam Lab work reviewed in detail personally by myself Patient understands risk and benefit of the procedure, willing to proceed  Signed, Tim Tod Abrahamsen, MD, Ph.D [facility]  

## 2020-05-30 NOTE — Interval H&P Note (Signed)
History and Physical Interval Note:  05/30/2020 12:38 PM  [name]  has presented today for surgery, with the diagnosis of Cardioversion   Afib.  The various methods of treatment have been discussed with the patient and family. After consideration of risks, benefits and other options for treatment, the patient has consented to  Procedure(s): CARDIOVERSION (N/A) as a surgical intervention.  The patient's history has been reviewed, patient examined, no change in status, stable for surgery.  I have reviewed the patient's chart and labs.  Questions were answered to the patient's satisfaction.     Melinda [name]

## 2020-05-31 ENCOUNTER — Telehealth: Payer: Self-pay | Admitting: Cardiovascular Disease

## 2020-05-31 ENCOUNTER — Ambulatory Visit: Payer: Medicare HMO | Admitting: Physician Assistant

## 2020-05-31 DIAGNOSIS — E1159 Type 2 diabetes mellitus with other circulatory complications: Secondary | ICD-10-CM

## 2020-05-31 DIAGNOSIS — Z794 Long term (current) use of insulin: Secondary | ICD-10-CM

## 2020-05-31 MED ORDER — ATORVASTATIN CALCIUM 80 MG PO TABS: 80.0000 mg | ORAL_TABLET | Freq: Every day | ORAL | 1 refills | Status: DC

## 2020-05-31 NOTE — Telephone Encounter (Signed)
Received fax from [employer] requesting refills for Atorvastatin 80 mg. Rx request sent to pharmacy.

## 2020-06-04 ENCOUNTER — Other Ambulatory Visit: Payer: Self-pay | Admitting: Cardiovascular Disease

## 2020-06-04 DIAGNOSIS — I2510 Atherosclerotic heart disease of native coronary artery without angina pectoris: Secondary | ICD-10-CM

## 2020-06-07 ENCOUNTER — Telehealth: Payer: Self-pay

## 2020-06-07 NOTE — Telephone Encounter (Signed)
[facility] pharmacy requesting refill of varenicline (Chantix) 1mg  tablets. Qty#112 Please advise if OK to refill.

## 2020-06-09 ENCOUNTER — Telehealth: Payer: Self-pay | Admitting: Cardiovascular Disease

## 2020-06-09 NOTE — Progress Notes (Signed)
Melinda [name], Melinda [name] ([id number]) Visit Report for 04/22/2020 Arrival Information Details Patient Name: Melinda [name], Melinda [name] Date of Service: 04/22/2020 3:45 PM Medical Record Number: [id number] Patient Account Number: [id number] Date of Birth/Sex: Dec 13, 1955 (64 y.o. F) Treating RN: Dolan Amen Primary Care Rishit Burkhalter: [name] Other Clinician: Referring Davan Nawabi: [name] Treating Noreen Mackintosh/Extender: Skipper Cliche in Treatment: 18 Visit Information History Since Last Visit Pain Present Now: No Patient Arrived: Ambulatory Arrival Time: 15:48 Accompanied By: self Transfer Assistance: None Patient Identification Verified: Yes Secondary Verification Process Completed: Yes Patient Requires Transmission-Based No Precautions: Patient Has Alerts: Yes Patient Alerts: 10/28/19 ABI: R 1.05 L .60 Electronic Signature(s) Signed: 04/22/2020 4:19:23 PM By: Georges Mouse, Minus Breeding RN Entered By: Georges Mouse, Minus Breeding on 04/22/2020 15:49:10 Melinda [name] ([id number]) -------------------------------------------------------------------------------- Complex / Palliative Patient Assessment Details Patient Name: Melinda [name] Date of Service: 04/22/2020 3:45 PM Medical Record Number: [id number] Patient Account Number: [id number] Date of Birth/Sex: [birth year] (64 y.o. F) Treating RN: [name] Primary Care Tarri Guilfoil: [name] Other Clinician: Referring Reagen Haberman: [name] Treating Ezekiel Menzer/Extender: Skipper Cliche in Treatment: 18 Palliative Management Criteria Complex Wound Management Criteria Patient has remarkable or complex co-morbidities requiring medications or treatments that extend wound healing times. Examples: o Diabetes mellitus with chronic renal failure or end stage renal disease requiring dialysis o Advanced or poorly controlled rheumatoid arthritis o Diabetes mellitus and end stage chronic obstructive pulmonary disease o Active cancer with current chemo-  or radiation therapy DM, Neuropathy, Prior Amputation Care Approach Wound Care Plan: Complex Wound Management Electronic Signature(s) Signed: 05/01/2020 5:27:09 PM By: [name], [name], RN, [name], [name] RN, [name] Signed: 05/03/2020 2:13:21 PM By: [name] [name] Entered By: [name], [name], RN, [name], [name] on 05/01/2020 17:27:08 Melinda [name] ([id number]) -------------------------------------------------------------------------------- Encounter Discharge Information Details Patient Name: Melinda [name], Melinda [name] Date of Service: 04/22/2020 3:45 PM Medical Record Number: [id number] Patient Account Number: [id number] Date of Birth/Sex: [birth year] (64 y.o. F) Treating RN: [name] Primary Care Linville Decarolis: [name] Other Clinician: Referring Lytle Malburg: [name] Treating Ruthy Forry/Extender: Skipper Cliche in Treatment: 18 Encounter Discharge Information Items Post Procedure Vitals Discharge Condition: Stable Unable to obtain vitals Reason: . Ambulatory Status: Ambulatory Discharge Destination: Home Transportation: Private Auto Accompanied By: self Schedule Follow-up Appointment: Yes Clinical Summary of Care: Electronic Signature(s) Signed: 05/02/2020 3:50:49 PM By: [name], [name], RN, [name], [name] RN, [name] Entered By: [name], [name], RN, [name], [name] on 05/02/2020 15:50:49 Melinda [name] ([id number]) -------------------------------------------------------------------------------- Lower Extremity Assessment Details Patient Name: Melinda [name], Melinda [name] Date of Service: 04/22/2020 3:45 PM Medical Record Number: [id number] Patient Account Number: [id number] Date of Birth/Sex: 07/16/1955 (64 y.o. F) Treating RN: Dolan Amen Primary Care Female Iafrate: [name] Other Clinician: Referring Nesa Distel: [name] Treating Dorean Daniello/Extender: Jeri Cos Weeks in Treatment: 18 Edema Assessment Assessed: [Left: Yes] [Right: No] Edema: [Left: N] [Right: o] Electronic Signature(s) Signed: 04/22/2020 4:19:23 PM By: Georges Mouse, Minus Breeding RN Entered By: Georges Mouse, Minus Breeding on 04/22/2020 16:01:20 Melinda [name] ([id number]) -------------------------------------------------------------------------------- Multi Wound Chart Details Patient Name: Melinda [name] Date of Service: 04/22/2020 3:45 PM Medical Record Number: [id number] Patient Account Number: [id number] Date of Birth/Sex: [birth year] (64 y.o. F) Treating RN: [name] Primary Care Alee Katen: [name] Other Clinician: Referring Floyde Dingley: [name] Treating Ardene Remley/Extender: Skipper Cliche in Treatment: 18 Vital Signs Height(in): 69 Pulse(bpm): 130 Weight(lbs): Blood Pressure(mmHg): 122/76 Body Mass Index(BMI): Temperature(F): 98.8 Respiratory Rate(breaths/min): 18 Photos: [N/A:N/A] Wound Location: Left, Plantar Toe Great N/A N/A Wounding Event: Gradually Appeared N/A N/A Primary Etiology: Diabetic Wound/Ulcer  of the Lower N/A N/A Extremity Comorbid History: Cataracts, Coronary Artery Disease, N/A N/A Hypertension, Myocardial Infarction, Peripheral Venous Disease, Type II Diabetes, Osteoarthritis, Osteomyelitis, Neuropathy Date Acquired: 12/02/2019 N/A N/A Weeks of Treatment: 18 N/A N/A Wound Status: Open N/A N/A Measurements L x W x D (cm) 0.3x0.3x0.5 N/A N/A Area (cm) : 0.071 N/A N/A Volume (cm) : 0.035 N/A N/A % Reduction in Area: 97.20% N/A N/A % Reduction in Volume: 98.50% N/A N/A Classification: Grade 2 N/A N/A Exudate Amount: Small N/A N/A Exudate Type: Serous N/A N/A Exudate Color: amber N/A N/A Wound Margin: Thickened N/A N/A Granulation Amount: Large (67-100%) N/A N/A Granulation Quality: Red N/A N/A Necrotic Amount: None Present (0%) N/A N/A Exposed Structures: Fat Layer (Subcutaneous Tissue): N/A N/A Yes Fascia: No Tendon: No Muscle: No Joint: No Bone: No Epithelialization: Small (1-33%) N/A N/A Treatment Notes Electronic Signature(s) Signed: 04/23/2020 7:55:12 AM By: [name], [name], RN, [name], [name] RN,  [name] Entered By: [name], [name], RN, [name], [name] on 04/22/2020 16:06:50 Melinda [name], Melinda [name] ([id number] [name] ([id number]) -------------------------------------------------------------------------------- Henry Details Patient Name: Melinda [name], Melinda [name] Date of Service: 04/22/2020 3:45 PM Medical Record Number: [id number] Patient Account Number: [id number] Date of Birth/Sex: 03-09-1956 (64 y.o. F) Treating RN: [name] Primary Care Tyjae Issa: [name] Other Clinician: Referring Admire Bunnell: [name] Treating Nikia Mangino/Extender: Skipper Cliche in Treatment: 18 Active Inactive Necrotic Tissue Nursing Diagnoses: Impaired tissue integrity related to necrotic/devitalized tissue Goals: Necrotic/devitalized tissue will be minimized in the wound bed Date Initiated: 12/16/2019 Target Resolution Date: 12/26/2019 Goal Status: Active Interventions: Assess patient pain level pre-, during and post procedure and prior to discharge Treatment Activities: Excisional debridement : 12/16/2019 Notes: Orientation to the Wound Care Program Nursing Diagnoses: Knowledge deficit related to the wound healing center program Goals: Patient/caregiver will verbalize understanding of the [facility] Date Initiated: 12/16/2019 Target Resolution Date: 12/26/2019 Goal Status: Active Interventions: Provide education on orientation to the wound center Notes: Peripheral Neuropathy Nursing Diagnoses: Knowledge deficit related to disease process and management of peripheral neurovascular dysfunction Potential alteration in peripheral tissue perfusion (select prior to confirmation of diagnosis) Goals: Patient/caregiver will verbalize understanding of disease process and disease management Date Initiated: 12/16/2019 Target Resolution Date: 12/26/2019 Goal Status: Active Interventions: Provide education on Management of Neuropathy and Related Ulcers Treatment  Activities: Patient referred for customized footwear/offloading : 12/16/2019 Notes: Pressure Nursing Diagnoses: Melinda [name], Melinda [name] ([id number]) Knowledge deficit related to causes and risk factors for pressure ulcer development Knowledge deficit related to management of pressures ulcers Goals: Patient will remain free from development of additional pressure ulcers Date Initiated: 12/16/2019 Target Resolution Date: 12/26/2019 Goal Status: Active Interventions: Provide education on pressure ulcers Notes: Wound/Skin Impairment Nursing Diagnoses: Impaired tissue integrity Goals: Ulcer/skin breakdown will have a volume reduction of 30% by week 4 Date Initiated: 12/16/2019 Target Resolution Date: 01/16/2020 Goal Status: Active Interventions: Assess patient/caregiver ability to obtain necessary supplies Assess ulceration(s) every visit Treatment Activities: Skin care regimen initiated : 12/16/2019 Notes: Electronic Signature(s) Signed: 04/23/2020 7:55:12 AM By: [name], [name], RN, [name], [name] RN, [name] Entered By: [name], [name], RN, [name], [name] on 04/22/2020 16:06:36 Melinda [name] ([id number]) -------------------------------------------------------------------------------- Pain Assessment Details Patient Name: Melinda [name] Date of Service: 04/22/2020 3:45 PM Medical Record Number: [id number] Patient Account Number: [id number] Date of Birth/Sex: 1955/05/20 (64 y.o. F) Treating RN: Dolan Amen Primary Care Akash Winski: [name] Other Clinician: Referring Arihanna Estabrook: [name] Treating Kamorie Aldous/Extender: Skipper Cliche in Treatment: 18 Active Problems Location of Pain Severity and  Description of Pain Patient Has Paino No Site Locations Rate the pain. Current Pain Level: 0 Pain Management and Medication Current Pain Management: Electronic Signature(s) Signed: 04/22/2020 4:19:23 PM By: Georges Mouse, Minus Breeding RN Entered By: Georges Mouse, Kenia on 04/22/2020 15:51:39 Melinda [name]  ([id number]) -------------------------------------------------------------------------------- Patient/Caregiver Education Details Patient Name: Melinda [name] Date of Service: 04/22/2020 3:45 PM Medical Record Number: [id number] Patient Account Number: [id number] Date of Birth/Gender: [birth year] (64 y.o. F) Treating RN: [name] Primary Care Physician: [name] Other Clinician: Referring Physician: [name] Treating Physician/Extender: Skipper Cliche in Treatment: 18 Education Assessment Education Provided To: Patient Education Topics Provided Wound/Skin Impairment: Handouts: Caring for Your Ulcer Methods: Demonstration, Explain/Verbal Responses: State content correctly Electronic Signature(s) Signed: 06/09/2020 8:48:54 AM By: [name], [name], RN, [name], [name] RN, [name] Entered By: [name], [name], RN, [name], [name] on 05/02/2020 15:49:35 Melinda [name] ([id number]) -------------------------------------------------------------------------------- Wound Assessment Details Patient Name: Melinda [name], Melinda [name] Date of Service: 04/22/2020 3:45 PM Medical Record Number: [id number] Patient Account Number: [id number] Date of Birth/Sex: [birth year] (64 y.o. F) Treating RN: Dolan Amen Primary Care Ardyce Heyer: [name] Other Clinician: Referring Lilyona Richner: [name] Treating Nairi Oswald/Extender: Skipper Cliche in Treatment: 18 Wound Status Wound Number: 5 Primary Diabetic Wound/Ulcer of the Lower Extremity Etiology: Wound Location: Left, Plantar Toe Great Wound Open Wounding Event: Gradually Appeared Status: Date Acquired: 12/02/2019 Comorbid Cataracts, Coronary Artery Disease, Hypertension, Weeks Of Treatment: 18 History: Myocardial Infarction, Peripheral Venous Disease, Type II Clustered Wound: No Diabetes, Osteoarthritis, Osteomyelitis, Neuropathy Photos Wound Measurements Length: (cm) 0.3 Width: (cm) 0.3 Depth: (cm) 0.5 Area: (cm) 0.071 Volume: (cm) 0.035 % Reduction in Area:  97.2% % Reduction in Volume: 98.5% Epithelialization: Small (1-33%) Tunneling: No Undermining: No Wound Description Classification: Grade 2 Wound Margin: Thickened Exudate Amount: Small Exudate Type: Serous Exudate Color: amber Foul Odor After Cleansing: No Slough/Fibrino No Wound Bed Granulation Amount: Large (67-100%) Exposed Structure Granulation Quality: Red Fascia Exposed: No Necrotic Amount: None Present (0%) Fat Layer (Subcutaneous Tissue) Exposed: Yes Tendon Exposed: No Muscle Exposed: No Joint Exposed: No Bone Exposed: No Electronic Signature(s) Signed: 04/22/2020 4:19:23 PM By: Georges Mouse, Minus Breeding RN Entered By: Georges Mouse, Minus Breeding on 04/22/2020 16:00:59 Melinda [name] ([id number]) -------------------------------------------------------------------------------- Vitals Details Patient Name: Melinda [name] Date of Service: 04/22/2020 3:45 PM Medical Record Number: [id number] Patient Account Number: [id number] Date of Birth/Sex: [birth year] (64 y.o. F) Treating RN: Dolan Amen Primary Care Anthon Harpole: [name] Other Clinician: Referring Aysha Livecchi: [name] Treating Chenay Nesmith/Extender: Skipper Cliche in Treatment: 18 Vital Signs Time Taken: 15:49 Temperature (F): 98.8 Height (in): 69 Pulse (bpm): 130 Respiratory Rate (breaths/min): 18 Blood Pressure (mmHg): 122/76 Reference Range: 80 - 120 mg / dl Electronic Signature(s) Signed: 04/22/2020 4:19:23 PM By: Georges Mouse, Minus Breeding RN Entered By: Georges Mouse, Minus Breeding on 04/22/2020 15:51:22

## 2020-06-09 NOTE — Telephone Encounter (Signed)
Patient calling  States that her heart has started racing again  She has had her heart shocked a couple of times but they never seem to last  Please call to discuss

## 2020-06-09 NOTE — Telephone Encounter (Signed)
Was able to reach back out to pt, pt reports this morning her HR 111 and BP 138/89. Pt reports, HR typically not this high and in the past when she had a higher heart rate "they shocked me". Pt reports no CP or chest tightness. Denies dizziness, lightheadedness, but some shob for 3 days. Pt did report taking her morning medications of amiodarone 200 mg and her metoprolol 50 mg. BP while on phone is 106/83 HR 111. Tried to get pt on schedule today at 3:20, but pt reports has her first visit with PCP Dr. Clide Deutscher at 3:40 today. Advised pt be fully evaluated and see if they can do an EKG in office. They will check her vitals during her visit as well. Pt verbalized understanding. Advised pt that is she develops sudden CP, radiation down arms, dizziness, increase shob, then she may need to seek medical treatment at ED for a full cardiac work-up with labs. Pt verbalized understanding. Will see her PCP this afternoon and go from there. Otherwise all questions or concerns were address and no additional concerns at this time. Agreeable to plan, will call back for anything further.

## 2020-06-15 NOTE — Progress Notes (Signed)
Cardiology Office Note  Date:  06/18/2020   ID:  Melinda [name], DOB 13-Jul-1955, MRN [id number]  PCP:  Donnie Coffin, MD   Chief Complaint  Patient presents with  . Follow-up    S/p DCCV    HPI:  65 y.o. female with h/o CAD s/p 4 vessel CABG on 06/08/2014  poorly controlled IDDM,  HTN,  ongoing tobacco abuse, 2 cig  obesity,  anxiety,  peripheral neuropathy  admitted to [facility] from 2/1-06/03/14 for NSTEMI.  After undergoing cardiac cath on 2/3 that showed severe 3 vessel disease she was transferred to [facility] from 2/3-2/13 for . 4 vessel CABG (LIMA to LAD, SVG to Diagonal, SVG to OM, and SVG to PDA) She presents today for follow-up in the clinic for her coronary artery disease, PAD, smoking, diabetes, atrial flutter  COVID-19 just prior to Thanksgiving 2021  newly diagnosed atrial flutter at the time of a lexiscan on 04/01/20.   She presents today for follow-up after her second cardioversion for atrial flutter Most recent cardioversion May 28, 2020 performed on amiodarone (started end of December 2021) In follow-up today she reports that normal sinus rhythm did not hold more than 3 days When going back into flutter, has symptoms of hot flashes, feelsTired,  SOB, edema, hot flashes, more anxious  prior TEE cardioversion 04/14/2020, normal sinus rhythm lasting less than a week Did not hold, amiodarone was then added Again as detailed above did not hold, no symptoms back again  Taking Lasix on a more frequent basis, still with ankle swelling,  EKG personally reviewed by myself on todays visit Atrial flutter rate 105 bpm    PAD history, interventions on her legs August 2020  Aug 2020 --Percutaneous transluminal angioplasty of the anterior tibial artery with 3 mm diameter by 10 cm length angioplasty balloon -- Percutaneous transluminal angioplasty of the left distal SFA with 5 mm diameter by 8 cm length Lutonix drug-coated angioplasty balloon -- Viabahn stent placement to the  distal left SFA with 6 mm diameter by 5 cm length stent for greater than 50% residual stenosis after angioplasty  Repeat intervention 1 week later --Percutaneous transluminal angioplasty of right anterior tibial artery with 3 mm diameter by 22 cm length angioplasty balloon and 4 mm diameter by 6 cm Lutonix drug-coated angioplasty balloon in the proximal segment --Percutaneous transluminal angioplasty of the right tibioperoneal trunk and peroneal artery with 3 mm diameter by 22 cm length angioplasty balloon --Percutaneous transluminal angioplasty of the right SFA and popliteal arteries with a 5 mm diameter by 30 cm length and 5 mm diameter by 10 cm length Lutonix drug-coated angioplasty balloon  previous toe amputation May 28, 2017 operative note from Dr. Lucky Cowboy Reporting PAD with ulceration and gangrene left foot Angioplasty of left SFA and popliteal artery Covered stent x2 to left SFA Angioplasty left posterior tibial artery    presented to [facility] ED on 06/01/14 with  chest tightness,  Cardiac cath showed 95% stenosis mLAD, occlusion ostial OM1, 70% stenosis LCx, 95% stenosis mRCA, EF 45%. She underwent successful cabg on 06/08/2014 at [facility].  Echo at [facility] showed EF 40-45%,  HK of entire inferior and inferolateral wall c/w infarction of RCA or LCx,    PMH:   has a past medical history of Anxiety, Atrial fibrillation ([place]), Atrial flutter ([place]), Coronary artery disease, Depression, Diabetic neuropathy ([place]), GERD (gastroesophageal reflux disease), HTN (hypertension), Hyperlipidemia LDL goal <70, IDDM (insulin dependent diabetes mellitus), Ischemic cardiomyopathy, Myocardial infarction ([place]) (2016), OA (osteoarthritis) of knee,  Obesity, Osteoarthritis, Osteomyelitis ([place]), Peripheral neuropathy, Peripheral vascular disease ([place]), and Tobacco abuse.  PSH:    Past Surgical History:  Procedure Laterality Date  . ABDOMINAL AORTOGRAM W/LOWER EXTREMITY N/A 10/27/2016   Procedure: Abdominal Aortogram  w/Lower Extremity;  Surgeon: Nelva Bush, MD;  Location: [place] [facility];  Service: Cardiovascular;  Laterality: N/A;  . AMPUTATION TOE Left 10/21/2015   Procedure: AMPUTATION TOE;  Surgeon: Sharlotte Alamo, DPM;  Location: [facility] OR[facility];  Service: Podiatry;  Laterality: Left;  . AMPUTATION TOE Left 05/15/2017   Procedure: AMPUTATION LEFT GREAT TOE;  Surgeon: Sharlotte Alamo, DPM;  Location: [facility] OR[facility];  Service: Podiatry;  Laterality: Left;  . AORTIC VALVE REPLACEMENT (AVR)/CORONARY ARTERY BYPASS GRAFTING (CABG)    . BREAST BIOPSY    . CARDIAC CATHETERIZATION  06/2014   95% stenosis mLAD, occlusion ostial OM1, 70% stenosis LCx, 95% stenosis mRCA, EF 45%.  Marland Kitchen CARDIOVERSION N/A 04/14/2020   Procedure: CARDIOVERSION with TEE;  Surgeon: Minna Merritts, MD;  Location: [facility] OR[facility];  Service: Cardiovascular;  Laterality: N/A;  . CARDIOVERSION N/A 05/28/2020   Procedure: CARDIOVERSION;  Surgeon: Minna Merritts, MD;  Location: [facility] OR[facility];  Service: Cardiovascular;  Laterality: N/A;  . CORONARY ARTERY BYPASS GRAFT N/A 06/08/2014   Procedure: CORONARY ARTERY BYPASS GRAFTING (CABG);  Surgeon: Grace Isaac, MD;  Location: [place];  Service: Open Heart Surgery;  Laterality: N/A;  Times 4 using left internal mammary artery to LAD artery and endoscopically harvested bilateral saphenous vein to Obtuse Marginal, Diagonal and Posterior Descending coronary arteries.  . CT ABD W & PELVIS WO CM  06/2014   nl liver, gallbladder, spleen, mild diverticular changes, no bowel wall inflammation, appendix nl, no hernia, no other sig abnormalities  . GASTRIC ROUX-EN-Y N/A 02/09/2020   Procedure: LAPAROSCOPIC ROUX-EN-Y GASTRIC BYPASS WITH UPPER ENDOSCOPY;  Surgeon: Johnathan Hausen, MD;  Location: [facility];  Service: General;  Laterality: N/A;  . LOWER EXTREMITY ANGIOGRAPHY Left 05/23/2017   Procedure: LOWER EXTREMITY ANGIOGRAPHY;  Surgeon: Algernon Huxley, MD;  Location: [place] [facility];  Service: Cardiovascular;  Laterality: Left;  .  LOWER EXTREMITY ANGIOGRAPHY Left 05/28/2017   Procedure: LOWER EXTREMITY ANGIOGRAPHY;  Surgeon: Algernon Huxley, MD;  Location: [place] [facility];  Service: Cardiovascular;  Laterality: Left;  . LOWER EXTREMITY ANGIOGRAPHY Left 12/16/2018   Procedure: LOWER EXTREMITY ANGIOGRAPHY;  Surgeon: Algernon Huxley, MD;  Location: [place] [facility];  Service: Cardiovascular;  Laterality: Left;  . LOWER EXTREMITY ANGIOGRAPHY Right 12/23/2018   Procedure: LOWER EXTREMITY ANGIOGRAPHY;  Surgeon: Algernon Huxley, MD;  Location: [place] [facility];  Service: Cardiovascular;  Laterality: Right;  . TEE WITHOUT CARDIOVERSION N/A 06/08/2014   Procedure: TRANSESOPHAGEAL ECHOCARDIOGRAM (TEE);  Surgeon: Grace Isaac, MD;  Location: [place];  Service: Open Heart Surgery;  Laterality: N/A;  . TEE WITHOUT CARDIOVERSION N/A 04/14/2020   Procedure: TRANSESOPHAGEAL ECHOCARDIOGRAM (TEE);  Surgeon: Minna Merritts, MD;  Location: [facility];  Service: Cardiovascular;  Laterality: N/A;  . TOE AMPUTATION Right 12/2011   rt middle toe  . UPPER GI ENDOSCOPY N/A 02/09/2020   Procedure: UPPER GI ENDOSCOPY;  Surgeon: Johnathan Hausen, MD;  Location: [facility];  Service: General;  Laterality: N/A;  . US ECHOCARDIOGRAPHY  06/2014   EF 50-55%, HK of inf/post/inferolat walls, Ao sclerosis    Current Outpatient Medications on File Prior to Visit  Medication Sig Dispense Refill  . ACCU-CHEK AVIVA PLUS test strip 1 each 3 (three) times daily.    Marland Kitchen  Accu-Chek Softclix Lancets lancets 1 each 3 (three) times daily.    . Alcohol Swabs (B-D SINGLE USE SWABS REGULAR) PADS Apply topically.    Marland Kitchen amiodarone (PACERONE) 200 MG tablet Take 1 tablet (200 mg total) by mouth 2 (two) times daily. 60 tablet 3  . atorvastatin (LIPITOR) 80 MG tablet Take 1 tablet (80 mg total) by mouth daily at 6 PM. 90 tablet 1  . Cholecalciferol (VITAMIN D3) 50 MCG (2000 UT) TABS Take 2,000 Units by mouth daily.    . clobetasol (TEMOVATE) 0.05 % external solution Patient to  mix entire bottle with 1 jar of CeraVe and apply twice a day x 1 month then as needed. Avoid face, groin, underarms. (Patient taking differently: Apply 1 application topically See admin instructions. Patient to mix entire bottle with 1 jar of CeraVe and apply twice a day x 1 month then as needed. Avoid face, groin, underarms.) 50 mL 0  . docusate sodium (COLACE) 100 MG capsule Take 100 mg by mouth 2 (two) times daily.    . DROPLET PEN NEEDLES 31G X 6 MM MISC     . DULoxetine (CYMBALTA) 60 MG capsule Take 60 mg by mouth daily.     . Eflornithine HCl 13.9 % cream Apply to upper lip, chin and neck twice a day. (Patient taking differently: Apply 1 application topically 2 (two) times daily. Apply to upper lip, chin and neck twice a day.) 45 g 2  . Emollient (CERAVE EX) Apply 1 application topically in the morning and at bedtime.    . ferrous sulfate 325 (65 FE) MG tablet Take 325 mg by mouth daily.    . furosemide (LASIX) 20 MG tablet Take 2 tablets (40 mg total) by mouth 2 (two) times daily as needed (fluid retention.). (Patient taking differently: Take 20 mg by mouth 2 (two) times daily as needed (fluid retention.).) 180 tablet 1  . insulin aspart (NOVOLOG) 100 UNIT/ML injection Inject 0-20 Units into the skin every 4 (four) hours. (Patient taking differently: Inject 3-7 Units into the skin every 4 (four) hours. Sliding scale) 10 mL 11  . lidocaine (LIDODERM) 5 % Place 1 patch onto the skin daily as needed (PAIN.).     Marland Kitchen metoprolol tartrate (LOPRESSOR) 50 MG tablet Take 1 tablet (50 mg total) by mouth 2 (two) times daily. 90 tablet 1  . Multiple Vitamins-Minerals (MULTIVITAMIN WITH MINERALS) tablet Take 2 tablets by mouth in the morning and at bedtime.    . ondansetron (ZOFRAN-ODT) 4 MG disintegrating tablet Take 1 tablet (4 mg total) by mouth every 6 (six) hours as needed for nausea or vomiting. 20 tablet 0  . oxybutynin (DITROPAN) 5 MG tablet Take 5 mg by mouth 2 (two) times daily.     . pantoprazole  (PROTONIX) 40 MG tablet Take 1 tablet (40 mg total) by mouth daily. 90 tablet 0  . potassium chloride SA (KLOR-CON) 20 MEQ tablet TAKE 1 TABLET (20 MEQ TOTAL) BY MOUTH DAILY AS NEEDED. (Patient taking differently: Take 20 mEq by mouth daily as needed.) 90 tablet 3  . pregabalin (LYRICA) 100 MG capsule Take 100 mg by mouth 3 (three) times daily.     Marland Kitchen tiZANidine (ZANAFLEX) 4 MG tablet Take 4 mg by mouth 3 (three) times daily.    . varenicline (CHANTIX STARTING MONTH PAK) 0.5 MG X 11 & 1 MG X 42 tablet Take one 0.5 mg tablet by mouth once daily for 3 days, then increase to one 0.5 mg tablet twice  daily for 4 days, then increase to one 1 mg tablet twice daily. 53 tablet 0  . apixaban (ELIQUIS) 5 MG TABS tablet Take 1 tablet (5 mg total) by mouth 2 (two) times daily. 60 tablet 6   No current facility-administered medications on file prior to visit.     Allergies:   Patient has no known allergies.   Social History:  The patient  reports that she has quit smoking. Her smoking use included cigarettes. She has a 20.00 pack-year smoking history. She has never used smokeless tobacco. She reports previous drug use. She reports that she does not drink alcohol.   Family History:   family history includes Asthma in an other family member; CAD in her father and mother; Diabetes in her father and mother; Hypertension in her mother; Sickle cell trait in an other family member.    Review of Systems: Review of Systems  Constitutional: Negative.   HENT: Negative.   Respiratory: Positive for shortness of breath.   Cardiovascular: Positive for leg swelling.  Gastrointestinal: Negative.   Musculoskeletal: Positive for back pain.  Neurological: Negative.   Psychiatric/Behavioral: Negative.   All other systems reviewed and are negative.   PHYSICAL EXAM: VS:  BP 130/82   Pulse (!) 105   Ht 5' 8.5" (1.74 m)   Wt 256 lb (116.1 kg)   BMI 38.36 kg/m  , BMI Body mass index is 38.36 kg/m. Constitutional:   oriented to person, place, and time. No distress.  HENT:  Head: Grossly normal Eyes:  no discharge. No scleral icterus.  Neck: No JVD, no carotid bruits  Cardiovascular: Regular rhythm, tachycardic, no murmurs appreciated 1+ pitting lower extremity edema Pulmonary/Chest: Clear to auscultation bilaterally, no wheezes or rales Abdominal: Soft.  no distension.  no tenderness.  Musculoskeletal: Normal range of motion Neurological:  normal muscle tone. Coordination normal. No atrophy Skin: Skin warm and dry Psychiatric: normal affect, pleasant  Recent Labs: 04/13/2020: ALT 19; TSH 1.122 05/26/2020: BUN 16; Creatinine, Ser 1.34; Hemoglobin 12.2; Platelets 295; Potassium 5.0; Sodium 137    Lipid Panel Lab Results  Component Value Date   CHOL 74 04/13/2020   HDL 33 (L) 04/13/2020   LDLCALC 23 04/13/2020   TRIG 89 04/13/2020      Wt Readings from Last 3 Encounters:  06/18/20 256 lb (116.1 kg)  05/14/20 266 lb (120.7 kg)  04/16/20 264 lb 9.6 oz (120 kg)      ASSESSMENT AND PLAN:  Atrial flutter Prior TEE cardioversion December 2021 successful for several days, recurrent flutter Repeated cardioversion on amiodarone, held for 3 days then back to flutter as documented on today's visit Has completed amiodarone load, will continue 200 twice daily and set up cardioversion next week at her convenience We did discuss ablation if normal sinus rhythm does not hold on amiodarone -We will refer for consultation for possible flutter ablation given heart failure symptoms, unable to maintain normal sinus rhythm on amiodarone -Without amiodarone has significant tachycardia with symptoms.  We will continue amiodarone for now  Coronary artery disease involving native coronary artery of native heart without angina pectoris -  Smoking cessation recommended, aggressive lipid control, diabetes control Denies angina  Essential hypertension -  Blood pressure is well controlled on today's visit. No  changes made to the medications. -Continue Lasix daily  Acute on chronic diastolic and systolic CHF Ejection fraction 35 to 40%, remains in flutter -Continue Lasix 40 daily Will work to restore normal sinus rhythm, will likely need ablation  [facility]) -  PV procedures from August 2020 Stressed smoking cessation, lipid control  S/P CABG x 4 Currently with no symptoms of angina. No further workup at this time. Continue current medication regimen.  Type 2 diabetes mellitus with other circulatory complication, with long-term current use of insulin (HCC) Hemoglobin A1c 6.6 Slow trend down in weight  Tobacco abuse Still smoking less than 1 pack/day   Total encounter time more than 25 minutes  Greater than 50% was spent in counseling and coordination of care with the patient    No orders of the defined types were placed in this encounter.    Signed, Esmond Plants, M.D., Ph.D. 06/18/2020  [place], [place]

## 2020-06-18 ENCOUNTER — Ambulatory Visit: Payer: Medicare HMO | Admitting: Cardiovascular Disease

## 2020-06-18 ENCOUNTER — Encounter: Payer: Self-pay | Admitting: Cardiovascular Disease

## 2020-06-18 ENCOUNTER — Other Ambulatory Visit: Payer: Self-pay

## 2020-06-18 VITALS — BP 130/82 | HR 105 | Ht 68.5 in | Wt 256.0 lb

## 2020-06-18 DIAGNOSIS — I480 Paroxysmal atrial fibrillation: Secondary | ICD-10-CM | POA: Diagnosis not present

## 2020-06-18 DIAGNOSIS — Z951 Presence of aortocoronary bypass graft: Secondary | ICD-10-CM

## 2020-06-18 DIAGNOSIS — I483 Typical atrial flutter: Secondary | ICD-10-CM

## 2020-06-18 DIAGNOSIS — Z794 Long term (current) use of insulin: Secondary | ICD-10-CM

## 2020-06-18 DIAGNOSIS — I10 Essential (primary) hypertension: Secondary | ICD-10-CM

## 2020-06-18 DIAGNOSIS — E785 Hyperlipidemia, unspecified: Secondary | ICD-10-CM

## 2020-06-18 DIAGNOSIS — I25118 Atherosclerotic heart disease of native coronary artery with other forms of angina pectoris: Secondary | ICD-10-CM | POA: Diagnosis not present

## 2020-06-18 DIAGNOSIS — E1159 Type 2 diabetes mellitus with other circulatory complications: Secondary | ICD-10-CM | POA: Diagnosis not present

## 2020-06-18 NOTE — Patient Instructions (Addendum)
Appt with Dr. Quentin Ore for atrial flutter ablation  Medication Instructions:  No changes  If you need a refill on your cardiac medications before your next appointment, please call your pharmacy.    Lab work: No new labs needed   If you have labs (blood work) drawn today and your tests are completely normal, you will receive your results only by: Marland Kitchen MyChart Message (if you have MyChart) OR . A paper copy in the mail If you have any lab test that is abnormal or we need to change your treatment, we will call you to review the results.   Testing/Procedures: No new testing needed   Follow-Up: At [facility], you and your health needs are our priority.  As part of our continuing mission to provide you with exceptional heart care, we have created designated Provider Care Teams.  These Care Teams include your primary Cardiologist (physician) and Advanced Practice Providers (APPs -  Physician Assistants and Nurse Practitioners) who all work together to provide you with the care you need, when you need it.  . You will need a follow up appointment in 3 months  . Providers on your designated Care Team:   . Murray Hodgkins, NP . Christell Faith, [name] . Marrianne Mood, [name]  Any Other Special Instructions Will Be Listed Below (If Applicable).  COVID-19 Vaccine Information can be found at: [email or web address] For questions related to vaccine distribution or appointments, please email [email or web address] or call [phone].

## 2020-06-22 ENCOUNTER — Other Ambulatory Visit: Payer: Self-pay

## 2020-06-22 ENCOUNTER — Encounter: Payer: Medicare HMO | Attending: Physician Assistant | Admitting: Physician Assistant

## 2020-06-22 DIAGNOSIS — Z794 Long term (current) use of insulin: Secondary | ICD-10-CM | POA: Insufficient documentation

## 2020-06-22 DIAGNOSIS — E1151 Type 2 diabetes mellitus with diabetic peripheral angiopathy without gangrene: Secondary | ICD-10-CM | POA: Diagnosis not present

## 2020-06-22 DIAGNOSIS — L97522 Non-pressure chronic ulcer of other part of left foot with fat layer exposed: Secondary | ICD-10-CM | POA: Diagnosis not present

## 2020-06-22 DIAGNOSIS — E1142 Type 2 diabetes mellitus with diabetic polyneuropathy: Secondary | ICD-10-CM | POA: Insufficient documentation

## 2020-06-22 DIAGNOSIS — Z951 Presence of aortocoronary bypass graft: Secondary | ICD-10-CM | POA: Diagnosis not present

## 2020-06-22 DIAGNOSIS — E11621 Type 2 diabetes mellitus with foot ulcer: Secondary | ICD-10-CM | POA: Diagnosis present

## 2020-06-22 DIAGNOSIS — Z89411 Acquired absence of right great toe: Secondary | ICD-10-CM | POA: Diagnosis not present
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
## 2020-06-22 NOTE — Progress Notes (Addendum)
Melinda, [name] ([id number]) Visit Report for 06/22/2020 Arrival Information Details Patient Name: Melinda [name], Melinda [name] Date of Service: 06/22/2020 10:30 AM Medical Record Number: [id number] Patient Account Number: [id number] Date of Birth/Sex: 12-Dec-1955 (65 y.o. F) Treating RN: Carlene Coria Primary Care Provider: [name] Other Clinician: Referring Provider: [name] Treating Provider/Extender: Skipper Cliche in Treatment: 27 Visit Information History Since Last Visit All ordered tests and consults were completed: No Patient Arrived: Ambulatory Added or deleted any medications: No Arrival Time: 10:30 Any new allergies or adverse reactions: No Accompanied By: self Had a fall or experienced change in No Transfer Assistance: None activities of daily living that may affect Patient Identification Verified: Yes risk of falls: Secondary Verification Process Completed: Yes Signs or symptoms of abuse/neglect since last visito No Patient Requires Transmission-Based No Hospitalized since last visit: No Precautions: Has Dressing in Place as Prescribed: Yes Patient Has Alerts: Yes Has Compression in Place as Prescribed: Yes Patient Alerts: 10/28/19 ABI: R 1.05 Pain Present Now: No L .60 Electronic Signature(s) Signed: 06/24/2020 11:27:08 AM By: Carlene Coria RN Entered By: Carlene Coria on 06/22/2020 10:33:19 Melinda [name] ([id number]) -------------------------------------------------------------------------------- Clinic Level of Care Assessment Details Patient Name: Melinda [name] Date of Service: 06/22/2020 10:30 AM Medical Record Number: [id number] Patient Account Number: [id number] Date of Birth/Sex: 06/18/1955 (65 y.o. F) Treating RN: Dolan Amen Primary Care Provider: [name] Other Clinician: Referring Provider: [name] Treating Provider/Extender: Skipper Cliche in Treatment: 27 Clinic Level of Care Assessment Items TOOL 1 Quantity Score []  - Use when  EandM and Procedure is performed on INITIAL visit 0 ASSESSMENTS - Nursing Assessment / Reassessment []  - General Physical Exam (combine w/ comprehensive assessment (listed just below) when performed on new 0 pt. evals) []  - 0 Comprehensive Assessment (HX, ROS, Risk Assessments, Wounds Hx, etc.) ASSESSMENTS - Wound and Skin Assessment / Reassessment []  - Dermatologic / Skin Assessment (not related to wound area) 0 ASSESSMENTS - Ostomy and/or Continence Assessment and Care []  - Incontinence Assessment and Management 0 []  - 0 Ostomy Care Assessment and Management (repouching, etc.) PROCESS - Coordination of Care []  - Simple Patient / Family Education for ongoing care 0 []  - 0 Complex (extensive) Patient / Family Education for ongoing care []  - 0 Staff obtains Programmer, systems, Records, Test Results / Process Orders []  - 0 Staff telephones HHA, Nursing Homes / Clarify orders / etc []  - 0 Routine Transfer to another Facility (non-emergent condition) []  - 0 Routine Hospital Admission (non-emergent condition) []  - 0 New Admissions / Biomedical engineer / Ordering NPWT, Apligraf, etc. []  - 0 Emergency Hospital Admission (emergent condition) PROCESS - Special Needs []  - Pediatric / Minor Patient Management 0 []  - 0 Isolation Patient Management []  - 0 Hearing / Language / Visual special needs []  - 0 Assessment of Community assistance (transportation, D/C planning, etc.) []  - 0 Additional assistance / Altered mentation []  - 0 Support Surface(s) Assessment (bed, cushion, seat, etc.) INTERVENTIONS - Miscellaneous []  - External ear exam 0 []  - 0 Patient Transfer (multiple staff / Civil Service fast streamer / Similar devices) []  - 0 Simple Staple / Suture removal (25 or less) []  - 0 Complex Staple / Suture removal (26 or more) []  - 0 Hypo/Hyperglycemic Management (do not check if billed separately) []  - 0 Ankle / Brachial Index (ABI) - do not check if billed separately Has the patient been seen at  the hospital within the last three years: Yes Total Score: 0 Level Of Care: ____ Davina Poke  LYRA ALAIMO ([id number]) Electronic Signature(s) Signed: 06/22/2020 5:02:46 PM By: Georges Mouse, Minus Breeding RN Entered By: Georges Mouse, Minus Breeding on 06/22/2020 11:07:28 Melinda [name] ([id number]) -------------------------------------------------------------------------------- Encounter Discharge Information Details Patient Name: Melinda [name] Date of Service: 06/22/2020 10:30 AM Medical Record Number: [id number] Patient Account Number: [id number] Date of Birth/Sex: Dec 07, 1955 (65 y.o. F) Treating RN: Dolan Amen Primary Care Provider: [name] Other Clinician: Referring Provider: [name] Treating Provider/Extender: Skipper Cliche in Treatment: 27 Encounter Discharge Information Items Post Procedure Vitals Discharge Condition: Stable Temperature (F): 98.4 Ambulatory Status: Ambulatory Pulse (bpm): 112 Discharge Destination: Home Respiratory Rate (breaths/min): 18 Transportation: Private Auto Blood Pressure (mmHg): 110/76 Accompanied By: self Schedule Follow-up Appointment: Yes Clinical Summary of Care: Electronic Signature(s) Signed: 06/22/2020 5:02:46 PM By: Georges Mouse, Minus Breeding RN Entered By: Georges Mouse, Minus Breeding on 06/22/2020 11:09:06 Melinda [name] ([id number]) -------------------------------------------------------------------------------- Lower Extremity Assessment Details Patient Name: Melinda [name] Date of Service: 06/22/2020 10:30 AM Medical Record Number: [id number] Patient Account Number: [id number] Date of Birth/Sex: 12-Feb-1956 (65 y.o. F) Treating RN: Carlene Coria Primary Care Provider: [name] Other Clinician: Referring Provider: [name] Treating Provider/Extender: Skipper Cliche in Treatment: 27 Edema Assessment Assessed: [Left: No] [Right: No] [Left: Edema] [Right: :] Calf Left: Right: Point of Measurement: From Medial Instep 39  cm Knee To Floor Left: Right: From Medial Instep 25 cm Vascular Assessment Pulses: Dorsalis Pedis Palpable: [Left:Yes] Electronic Signature(s) Signed: 06/24/2020 11:27:08 AM By: Carlene Coria RN Entered By: Carlene Coria on 06/22/2020 10:42:42 Melinda [name] ([id number]) -------------------------------------------------------------------------------- Multi Wound Chart Details Patient Name: Melinda [name] Date of Service: 06/22/2020 10:30 AM Medical Record Number: [id number] Patient Account Number: [id number] Date of Birth/Sex: 06/18/1955 (65 y.o. F) Treating RN: Dolan Amen Primary Care Provider: [name] Other Clinician: Referring Provider: [name] Treating Provider/Extender: Skipper Cliche in Treatment: 27 Vital Signs Height(in): 69 Capillary Blood Glucose 136 (mg/dl): Weight(lbs): Pulse(bpm): 112 Body Mass Index(BMI): Blood Pressure(mmHg): 110/76 Temperature(F): 98.4 Respiratory Rate(breaths/min): 18 Photos: [N/A:N/A] Wound Location: Left, Plantar Toe Great N/A N/A Wounding Event: Gradually Appeared N/A N/A Primary Etiology: Diabetic Wound/Ulcer of the Lower N/A N/A Extremity Comorbid History: Cataracts, Coronary Artery Disease, N/A N/A Hypertension, Myocardial Infarction, Peripheral Venous Disease, Type II Diabetes, Osteoarthritis, Osteomyelitis, Neuropathy Date Acquired: 12/02/2019 N/A N/A Weeks of Treatment: 27 N/A N/A Wound Status: Open N/A N/A Measurements L x W x D (cm) 0.7x0.3x0.2 N/A N/A Area (cm) : 0.165 N/A N/A Volume (cm) : 0.033 N/A N/A % Reduction in Area: 93.40% N/A N/A % Reduction in Volume: 98.50% N/A N/A Classification: Grade 2 N/A N/A Exudate Amount: Medium N/A N/A Exudate Type: Serosanguineous N/A N/A Exudate Color: red, brown N/A N/A Wound Margin: Thickened N/A N/A Granulation Amount: Medium (34-66%) N/A N/A Granulation Quality: Pink N/A N/A Necrotic Amount: Medium (34-66%) N/A N/A Exposed Structures: Fat Layer  (Subcutaneous Tissue): N/A N/A Yes Fascia: No Tendon: No Muscle: No Joint: No Bone: No Epithelialization: Large (67-100%) N/A N/A Treatment Notes Electronic Signature(s) Signed: 06/22/2020 5:02:46 PM By: Georges Mouse, Minus Breeding RN Entered By: Georges Mouse, Minus Breeding on 06/22/2020 11:01:23 Melinda [name] ([id number] Poke, Sonia Side ([id number]) -------------------------------------------------------------------------------- [place] Details Patient Name: Melinda [name] Date of Service: 06/22/2020 10:30 AM Medical Record Number: [id number] Patient Account Number: [id number] Date of Birth/Sex: 1956/03/11 (65 y.o. F) Treating RN: Dolan Amen Primary Care Provider: [name] Other Clinician: Referring Provider: [name] Treating Provider/Extender: Skipper Cliche in Treatment: 27 Active Inactive Necrotic Tissue Nursing Diagnoses: Impaired tissue integrity related to necrotic/devitalized  tissue Goals: Necrotic/devitalized tissue will be minimized in the wound bed Date Initiated: 12/16/2019 Target Resolution Date: 12/26/2019 Goal Status: Active Interventions: Assess patient pain level pre-, during and post procedure and prior to discharge Treatment Activities: Excisional debridement : 12/16/2019 Notes: Pressure Nursing Diagnoses: Knowledge deficit related to causes and risk factors for pressure ulcer development Knowledge deficit related to management of pressures ulcers Goals: Patient will remain free from development of additional pressure ulcers Date Initiated: 12/16/2019 Target Resolution Date: 12/26/2019 Goal Status: Active Interventions: Provide education on pressure ulcers Notes: Wound/Skin Impairment Nursing Diagnoses: Impaired tissue integrity Goals: Ulcer/skin breakdown will have a volume reduction of 30% by week 4 Date Initiated: 12/16/2019 Target Resolution Date: 01/16/2020 Goal Status: Active Interventions: Assess  patient/caregiver ability to obtain necessary supplies Assess ulceration(s) every visit Treatment Activities: Skin care regimen initiated : 12/16/2019 Notes: Electronic Signature(s) ALELI, NAVEDO ([id number]) Signed: 06/22/2020 5:02:46 PM By: Georges Mouse, Minus Breeding RN Entered By: Georges Mouse, Minus Breeding on 06/22/2020 11:01:10 Melinda [name] ([id number]) -------------------------------------------------------------------------------- Pain Assessment Details Patient Name: Melinda [name] Date of Service: 06/22/2020 10:30 AM Medical Record Number: [id number] Patient Account Number: [id number] Date of Birth/Sex: [birth year] (65 y.o. F) Treating RN: Carlene Coria Primary Care Provider: [name] Other Clinician: Referring Provider: [name] Treating Provider/Extender: Skipper Cliche in Treatment: 27 Active Problems Location of Pain Severity and Description of Pain Patient Has Paino No Site Locations Pain Management and Medication Current Pain Management: Electronic Signature(s) Signed: 06/24/2020 11:27:08 AM By: Carlene Coria RN Entered By: Carlene Coria on 06/22/2020 10:34:15 Melinda [name] ([id number]) -------------------------------------------------------------------------------- Patient/Caregiver Education Details Patient Name: Melinda [name] Date of Service: 06/22/2020 10:30 AM Medical Record Number: [id number] Patient Account Number: [id number] Date of Birth/Gender: 1956/04/18 (65 y.o. F) Treating RN: Dolan Amen Primary Care Physician: [name] Other Clinician: Referring Physician: [name] Treating Physician/Extender: Skipper Cliche in Treatment: 27 Education Assessment Education Provided To: Patient Education Topics Provided Wound/Skin Impairment: Methods: Explain/Verbal Responses: State content correctly Electronic Signature(s) Signed: 06/22/2020 5:02:46 PM By: Georges Mouse, Minus Breeding RN Entered By: Georges Mouse, Minus Breeding on 06/22/2020  11:07:54 Melinda [name] ([id number]) -------------------------------------------------------------------------------- Wound Assessment Details Patient Name: Melinda [name] Date of Service: 06/22/2020 10:30 AM Medical Record Number: [id number] Patient Account Number: [id number] Date of Birth/Sex: 20-Apr-1956 (65 y.o. F) Treating RN: Carlene Coria Primary Care Provider: [name] Other Clinician: Referring Provider: [name] Treating Provider/Extender: Skipper Cliche in Treatment: 27 Wound Status Wound Number: 5 Primary Diabetic Wound/Ulcer of the Lower Extremity Etiology: Wound Location: Left, Plantar Toe Great Wound Open Wounding Event: Gradually Appeared Status: Date Acquired: 12/02/2019 Comorbid Cataracts, Coronary Artery Disease, Hypertension, Weeks Of Treatment: 27 History: Myocardial Infarction, Peripheral Venous Disease, Type II Clustered Wound: No Diabetes, Osteoarthritis, Osteomyelitis, Neuropathy Photos Wound Measurements Length: (cm) 0.7 Width: (cm) 0.3 Depth: (cm) 0.2 Area: (cm) 0.165 Volume: (cm) 0.033 % Reduction in Area: 93.4% % Reduction in Volume: 98.5% Epithelialization: Large (67-100%) Tunneling: No Undermining: No Wound Description Classification: Grade 2 Wound Margin: Thickened Exudate Amount: Medium Exudate Type: Serosanguineous Exudate Color: red, brown Foul Odor After Cleansing: No Slough/Fibrino Yes Wound Bed Granulation Amount: Medium (34-66%) Exposed Structure Granulation Quality: Pink Fascia Exposed: No Necrotic Amount: Medium (34-66%) Fat Layer (Subcutaneous Tissue) Exposed: Yes Tendon Exposed: No Muscle Exposed: No Joint Exposed: No Bone Exposed: No Treatment Notes Wound #5 (Toe Great) Wound Laterality: Plantar, Left Cleanser Normal Saline Discharge Instruction: Wash your hands with soap and water. Remove old dressing, discard into plastic bag and place into trash. Cleanse  the wound with Normal Saline prior to applying  a clean dressing using gauze sponges, not tissues or cotton balls. Do not Peth, Druscilla E. ([id number]) scrub or use excessive force. Pat dry using gauze sponges, not tissue or cotton balls. Peri-Wound Care Topical Primary Dressing Prisma 4.34 (in) Discharge Instruction: Moisten w/normal saline or sterile water; Cover wound as directed. Do not remove from wound bed. Secondary Dressing [place] Roll-Medium Discharge Instruction: Apply Conforming Stretch Guaze Bandage as directed Gauze Discharge Instruction: As directed: dry, moistened with saline or moistened with Dakins Solution Secured With 68M Medipore H Soft Cloth Surgical Tape, 2x2 (in/yd) Compression Wrap Compression Stockings Add-Ons Electronic Signature(s) Signed: 06/24/2020 11:27:08 AM By: Carlene Coria RN Entered By: Carlene Coria on 06/22/2020 10:42:16 Melinda [name] ([id number]) -------------------------------------------------------------------------------- Vitals Details Patient Name: Melinda [name] Date of Service: 06/22/2020 10:30 AM Medical Record Number: [id number] Patient Account Number: [id number] Date of Birth/Sex: Jul 29, 1955 (65 y.o. F) Treating RN: Carlene Coria Primary Care Provider: [name] Other Clinician: Referring Provider: [name] Treating Provider/Extender: Skipper Cliche in Treatment: 27 Vital Signs Time Taken: 10:33 Temperature (F): 98.4 Height (in): 69 Pulse (bpm): 112 Respiratory Rate (breaths/min): 18 Blood Pressure (mmHg): 110/76 Capillary Blood Glucose (mg/dl): 136 Reference Range: 80 - 120 mg / dl Notes CBG per patient Electronic Signature(s) Signed: 06/24/2020 11:27:08 AM By: Carlene Coria RN Entered By: Carlene Coria on 06/22/2020 10:34:08

## 2020-06-25 ENCOUNTER — Telehealth: Payer: Self-pay | Admitting: *Deleted

## 2020-06-25 NOTE — Telephone Encounter (Signed)
We started this pt on Chantix starter pack at [facility] 04/06/20. Pt now asking for refill. Do you want her to have this continued by PCP?  Please advise.

## 2020-06-25 NOTE — Telephone Encounter (Signed)
Faxed refill request Need new for New RX.  Varenicline (Chantix) 1 mg tablet bid.  Please advise if ok to refill pt originally on starter pack and now taking Varenicline (Chantix) 1 mg bid.

## 2020-06-28 ENCOUNTER — Other Ambulatory Visit: Payer: Self-pay

## 2020-06-28 ENCOUNTER — Encounter: Payer: Self-pay | Admitting: Cardiovascular Disease

## 2020-06-28 NOTE — Telephone Encounter (Signed)
Please have the pt reach out to her PCP for refills. If she has run out of the medication completely, we can patch her through with a refill of Chantix 1mg  BID x30 days for now; however, she should get subsequent refills from her PCP.

## 2020-06-28 NOTE — Telephone Encounter (Signed)
Please reach out to the patient and advise her of Jacquelyn's response.  Thank you!

## 2020-06-28 NOTE — Telephone Encounter (Signed)
Pharmacy has been informed to contact PCP for further refills.

## 2020-06-28 NOTE — Telephone Encounter (Signed)
This encounter was created in error - please disregard.

## 2020-06-28 NOTE — Telephone Encounter (Signed)
*  STAT* If patient is at the pharmacy, call can be transferred to refill team.   1. Which medications need to be refilled? (please list name of each medication and dose if known) Chantix  2. Which pharmacy/location (including street and city if local pharmacy) is medication to be sent to? [facility]  3. Do they need a 30 day or 90 day supply? [place]

## 2020-06-29 ENCOUNTER — Encounter: Payer: Medicare HMO | Attending: Physician Assistant | Admitting: Physician Assistant

## 2020-06-29 ENCOUNTER — Other Ambulatory Visit: Payer: Self-pay

## 2020-06-29 DIAGNOSIS — I2510 Atherosclerotic heart disease of native coronary artery without angina pectoris: Secondary | ICD-10-CM | POA: Diagnosis not present

## 2020-06-29 DIAGNOSIS — E11621 Type 2 diabetes mellitus with foot ulcer: Secondary | ICD-10-CM | POA: Insufficient documentation

## 2020-06-29 DIAGNOSIS — Z794 Long term (current) use of insulin: Secondary | ICD-10-CM | POA: Diagnosis not present

## 2020-06-29 DIAGNOSIS — E1142 Type 2 diabetes mellitus with diabetic polyneuropathy: Secondary | ICD-10-CM | POA: Diagnosis not present

## 2020-06-29 DIAGNOSIS — L97522 Non-pressure chronic ulcer of other part of left foot with fat layer exposed: Secondary | ICD-10-CM | POA: Insufficient documentation

## 2020-06-29 DIAGNOSIS — I10 Essential (primary) hypertension: Secondary | ICD-10-CM | POA: Insufficient documentation

## 2020-06-29 DIAGNOSIS — Z951 Presence of aortocoronary bypass graft: Secondary | ICD-10-CM | POA: Diagnosis not present

## 2020-06-29 DIAGNOSIS — E1151 Type 2 diabetes mellitus with diabetic peripheral angiopathy without gangrene: Secondary | ICD-10-CM | POA: Insufficient documentation

## 2020-06-29 MED ORDER — VARENICLINE TARTRATE 1 MG PO TABS: 1.0000 mg | ORAL_TABLET | Freq: Two times a day (BID) | ORAL | 0 refills | Status: AC

## 2020-06-29 NOTE — Telephone Encounter (Signed)
Spoke to pt. Notified per provider, will need to continue refills through PCP.  Pt verbalized understanding. Does ask for Rx today to cover for now.  Per recc below: Sent in Chantix 1mg  BID x 30 days, no refills. Pt confirms she will contact PCP to refill thereafter.  Rx sent to [facility] per pt request.  No further needs at this time.

## 2020-06-29 NOTE — Progress Notes (Addendum)
ALANIA, OVERHOLT ([id number]) Visit Report for 06/29/2020 Arrival Information Details Patient Name: Melinda [name], Melinda [name] Date of Service: 06/29/2020 11:15 AM Medical Record Number: [id number] Patient Account Number: [id number] Date of Birth/Sex: 03-06-1956 (65 y.o. F) Treating RN: [name] Primary Care Lakiah Dhingra: [name] Other Clinician: Referring Tanajah Boulter: [name] Treating Heavyn Yearsley/Extender: Skipper Cliche in Treatment: 28 Visit Information History Since Last Visit Has Compression in Place as Prescribed: Yes Patient Arrived: Ambulatory Pain Present Now: No Arrival Time: 11:44 Accompanied By: self Transfer Assistance: None Patient Identification Verified: Yes Secondary Verification Process Completed: Yes Patient Requires Transmission-Based No Precautions: Patient Has Alerts: Yes Patient Alerts: 10/28/19 ABI: R 1.05 L .60 Electronic Signature(s) Signed: 06/30/2020 5:56:34 PM By: [name], [name], RN, [name], [name] RN, [name] Entered By: [name], [name], RN, [name], [name] on 06/29/2020 11:44:50 Melinda [name] ([id number]) -------------------------------------------------------------------------------- Clinic Level of Care Assessment Details Patient Name: Melinda [name] Date of Service: 06/29/2020 11:15 AM Medical Record Number: [id number] Patient Account Number: [id number] Date of Birth/Sex: [birth year] (65 y.o. F) Treating RN: Dolan Amen Primary Care Massiah Minjares: [name] Other Clinician: Referring Vaudine Dutan: [name] Treating Dorothyann Mourer/Extender: Skipper Cliche in Treatment: 28 Clinic Level of Care Assessment Items TOOL 1 Quantity Score []  - Use when EandM and Procedure is performed on INITIAL visit 0 ASSESSMENTS - Nursing Assessment / Reassessment []  - General Physical Exam (combine w/ comprehensive assessment (listed just below) when performed on new 0 pt. evals) []  - 0 Comprehensive Assessment (HX, ROS, Risk Assessments, Wounds Hx, etc.) ASSESSMENTS - Wound and Skin Assessment /  Reassessment []  - Dermatologic / Skin Assessment (not related to wound area) 0 ASSESSMENTS - Ostomy and/or Continence Assessment and Care []  - Incontinence Assessment and Management 0 []  - 0 Ostomy Care Assessment and Management (repouching, etc.) PROCESS - Coordination of Care []  - Simple Patient / Family Education for ongoing care 0 []  - 0 Complex (extensive) Patient / Family Education for ongoing care []  - 0 Staff obtains Programmer, systems, Records, Test Results / Process Orders []  - 0 Staff telephones HHA, Nursing Homes / Clarify orders / etc []  - 0 Routine Transfer to another Facility (non-emergent condition) []  - 0 Routine Hospital Admission (non-emergent condition) []  - 0 New Admissions / Biomedical engineer / Ordering NPWT, Apligraf, etc. []  - 0 Emergency Hospital Admission (emergent condition) PROCESS - Special Needs []  - Pediatric / Minor Patient Management 0 []  - 0 Isolation Patient Management []  - 0 Hearing / Language / Visual special needs []  - 0 Assessment of Community assistance (transportation, D/C planning, etc.) []  - 0 Additional assistance / Altered mentation []  - 0 Support Surface(s) Assessment (bed, cushion, seat, etc.) INTERVENTIONS - Miscellaneous []  - External ear exam 0 []  - 0 Patient Transfer (multiple staff / Civil Service fast streamer / Similar devices) []  - 0 Simple Staple / Suture removal (25 or less) []  - 0 Complex Staple / Suture removal (26 or more) []  - 0 Hypo/Hyperglycemic Management (do not check if billed separately) []  - 0 Ankle / Brachial Index (ABI) - do not check if billed separately Has the patient been seen at the hospital within the last three years: Yes Total Score: 0 Level Of Care: ____ Melinda [name] ([id number]) Electronic Signature(s) Signed: 06/29/2020 5:03:53 PM By: Georges Mouse, Minus Breeding RN Entered By: Georges Mouse, Minus Breeding on 06/29/2020 12:11:07 Melinda [name]  ([id number]) -------------------------------------------------------------------------------- Encounter Discharge Information Details Patient Name: Melinda [name] Date of Service: 06/29/2020 11:15 AM Medical Record Number: [id number] Patient Account Number: [id number] Date of Birth/Sex:  1955-09-04 (65 y.o. F) Treating RN: Dolan Amen Primary Care Hampton Cost: [name] Other Clinician: Referring Yelitza Reach: [name] Treating Ndia Sampath/Extender: Skipper Cliche in Treatment: 28 Encounter Discharge Information Items Post Procedure Vitals Discharge Condition: Stable Temperature (F): 98.2 Ambulatory Status: Ambulatory Pulse (bpm): 109 Discharge Destination: Home Respiratory Rate (breaths/min): 18 Transportation: Private Auto Blood Pressure (mmHg): 105/70 Accompanied By: self Schedule Follow-up Appointment: Yes Clinical Summary of Care: Electronic Signature(s) Signed: 06/29/2020 5:03:53 PM By: Georges Mouse, Minus Breeding RN Entered By: Georges Mouse, Minus Breeding on 06/29/2020 12:12:31 Melinda [name] ([id number]) -------------------------------------------------------------------------------- Lower Extremity Assessment Details Patient Name: Melinda [name] Date of Service: 06/29/2020 11:15 AM Medical Record Number: [id number] Patient Account Number: [id number] Date of Birth/Sex: 17-Mar-1956 (65 y.o. F) Treating RN: [name] Primary Care Alistair Senft: [name] Other Clinician: Referring Laurena Valko: [name] Treating Sudais Banghart/Extender: Skipper Cliche in Treatment: 28 Edema Assessment Assessed: [Left: Yes] [Right: No] Edema: [Left: N] [Right: o] Vascular Assessment Pulses: Dorsalis Pedis Palpable: [Left:Yes] Electronic Signature(s) Signed: 06/30/2020 5:56:34 PM By: [name], [name], RN, [name], [name] RN, [name] Entered By: [name], [name], RN, [name], [name] on 06/29/2020 11:48:20 Melinda [name] ([id number]) -------------------------------------------------------------------------------- Multi Wound  Chart Details Patient Name: Melinda [name] Date of Service: 06/29/2020 11:15 AM Medical Record Number: [id number] Patient Account Number: [id number] Date of Birth/Sex: 1955/09/09 (65 y.o. F) Treating RN: Dolan Amen Primary Care Chrisha Vogel: [name] Other Clinician: Referring Jolicia Delira: [name] Treating Esmeralda Blanford/Extender: Skipper Cliche in Treatment: 28 Vital Signs Height(in): 69 Pulse(bpm): 109 Weight(lbs): Blood Pressure(mmHg): 105/70 Body Mass Index(BMI): Temperature(F): 98.2 Respiratory Rate(breaths/min): 18 Photos: [N/A:N/A] Wound Location: Left, Plantar Toe Great N/A N/A Wounding Event: Gradually Appeared N/A N/A Primary Etiology: Diabetic Wound/Ulcer of the Lower N/A N/A Extremity Comorbid History: Cataracts, Coronary Artery Disease, N/A N/A Hypertension, Myocardial Infarction, Peripheral Venous Disease, Type II Diabetes, Osteoarthritis, Osteomyelitis, Neuropathy Date Acquired: 12/02/2019 N/A N/A Weeks of Treatment: 28 N/A N/A Wound Status: Open N/A N/A Measurements L x W x D (cm) 0.9x0.3x0.2 N/A N/A Area (cm) : 0.212 N/A N/A Volume (cm) : 0.042 N/A N/A % Reduction in Area: 91.60% N/A N/A % Reduction in Volume: 98.10% N/A N/A Classification: Grade 2 N/A N/A Exudate Amount: Medium N/A N/A Exudate Type: Serosanguineous N/A N/A Exudate Color: red, brown N/A N/A Wound Margin: Thickened N/A N/A Granulation Amount: Large (67-100%) N/A N/A Granulation Quality: Pink N/A N/A Necrotic Amount: Small (1-33%) N/A N/A Exposed Structures: Fat Layer (Subcutaneous Tissue): N/A N/A Yes Fascia: No Tendon: No Muscle: No Joint: No Bone: No Epithelialization: Large (67-100%) N/A N/A Treatment Notes Electronic Signature(s) Signed: 06/29/2020 5:03:53 PM By: Georges Mouse, Minus Breeding RN Entered By: Georges Mouse, Minus Breeding on 06/29/2020 12:08:52 Melinda [name] ([id number]) Melinda [name], Melinda [name]  ([id number]) -------------------------------------------------------------------------------- Multi-Disciplinary Care Plan Details Patient Name: Melinda [name] Date of Service: 06/29/2020 11:15 AM Medical Record Number: [id number] Patient Account Number: [id number] Date of Birth/Sex: 1956-03-12 (65 y.o. F) Treating RN: Dolan Amen Primary Care Kaydyn Chism: [name] Other Clinician: Referring Benedict Kue: [name] Treating Angela Platner/Extender: Skipper Cliche in Treatment: 28 Active Inactive Necrotic Tissue Nursing Diagnoses: Impaired tissue integrity related to necrotic/devitalized tissue Goals: Necrotic/devitalized tissue will be minimized in the wound bed Date Initiated: 12/16/2019 Target Resolution Date: 12/26/2019 Goal Status: Active Interventions: Assess patient pain level pre-, during and post procedure and prior to discharge Treatment Activities: Excisional debridement : 12/16/2019 Notes: Pressure Nursing Diagnoses: Knowledge deficit related to causes and risk factors for pressure ulcer development Knowledge deficit related to management of pressures ulcers Goals: Patient will remain free from  development of additional pressure ulcers Date Initiated: 12/16/2019 Target Resolution Date: 12/26/2019 Goal Status: Active Interventions: Provide education on pressure ulcers Notes: Wound/Skin Impairment Nursing Diagnoses: Impaired tissue integrity Goals: Ulcer/skin breakdown will have a volume reduction of 30% by week 4 Date Initiated: 12/16/2019 Target Resolution Date: 01/16/2020 Goal Status: Active Interventions: Assess patient/caregiver ability to obtain necessary supplies Assess ulceration(s) every visit Treatment Activities: Skin care regimen initiated : 12/16/2019 Notes: Electronic Signature(s) Melinda [name], Melinda [name] ([id number]) Signed: 06/29/2020 5:03:53 PM By: Georges Mouse, Minus Breeding RN Entered By: Georges Mouse, Minus Breeding on 06/29/2020 12:08:28 Melinda [name]  ([id number]) -------------------------------------------------------------------------------- Pain Assessment Details Patient Name: Melinda [name] Date of Service: 06/29/2020 11:15 AM Medical Record Number: [id number] Patient Account Number: [id number] Date of Birth/Sex: 12/08/1955 (65 y.o. F) Treating RN: [name] Primary Care Manas Hickling: [name] Other Clinician: Referring Andrews Tener: [name] Treating Shelene Krage/Extender: Skipper Cliche in Treatment: 28 Active Problems Location of Pain Severity and Description of Pain Patient Has Paino No Site Locations Pain Management and Medication Current Pain Management: Notes nerve pain comes and goes Electronic Signature(s) Signed: 06/30/2020 5:56:34 PM By: [name], [name], RN, [name], [name] RN, [name] Entered By: [name], [name], RN, [name], [name] on 06/29/2020 11:46:51 Melinda [name] ([id number]) -------------------------------------------------------------------------------- Patient/Caregiver Education Details Patient Name: Melinda [name] Date of Service: 06/29/2020 11:15 AM Medical Record Number: [id number] Patient Account Number: [id number] Date of Birth/Gender: [birth year] (65 y.o. F) Treating RN: Dolan Amen Primary Care Physician: [name] Other Clinician: Referring Physician: [name] Treating Physician/Extender: Skipper Cliche in Treatment: 28 Education Assessment Education Provided To: Patient Education Topics Provided Wound Debridement: Methods: Explain/Verbal Responses: State content correctly Electronic Signature(s) Signed: 06/29/2020 5:03:53 PM By: Georges Mouse, Minus Breeding RN Entered By: Georges Mouse, Minus Breeding on 06/29/2020 12:11:27 Melinda [name] ([id number]) -------------------------------------------------------------------------------- Wound Assessment Details Patient Name: Melinda [name] Date of Service: 06/29/2020 11:15 AM Medical Record Number: [id number] Patient Account Number: [id number] Date of Birth/Sex:  1955-07-19 (65 y.o. F) Treating RN: [name] Primary Care Jolinda Pinkstaff: [name] Other Clinician: Referring Forrest Jaroszewski: [name] Treating Loyalty Brashier/Extender: Skipper Cliche in Treatment: 28 Wound Status Wound Number: 5 Primary Diabetic Wound/Ulcer of the Lower Extremity Etiology: Wound Location: Left, Plantar Toe Great Wound Open Wounding Event: Gradually Appeared Status: Date Acquired: 12/02/2019 Comorbid Cataracts, Coronary Artery Disease, Hypertension, Weeks Of Treatment: 28 History: Myocardial Infarction, Peripheral Venous Disease, Type II Clustered Wound: No Diabetes, Osteoarthritis, Osteomyelitis, Neuropathy Photos Wound Measurements Length: (cm) 0.9 Width: (cm) 0.3 Depth: (cm) 0.2 Area: (cm) 0.212 Volume: (cm) 0.042 % Reduction in Area: 91.6% % Reduction in Volume: 98.1% Epithelialization: Large (67-100%) Tunneling: No Undermining: No Wound Description Classification: Grade 2 Wound Margin: Thickened Exudate Amount: Medium Exudate Type: Serosanguineous Exudate Color: red, brown Foul Odor After Cleansing: No Slough/Fibrino Yes Wound Bed Granulation Amount: Large (67-100%) Exposed Structure Granulation Quality: Pink Fascia Exposed: No Necrotic Amount: Small (1-33%) Fat Layer (Subcutaneous Tissue) Exposed: Yes Necrotic Quality: Adherent Slough Tendon Exposed: No Muscle Exposed: No Joint Exposed: No Bone Exposed: No Treatment Notes Wound #5 (Toe Great) Wound Laterality: Plantar, Left Cleanser Normal Saline Discharge Instruction: Wash your hands with soap and water. Remove old dressing, discard into plastic bag and place into trash. Cleanse the wound with Normal Saline prior to applying a clean dressing using gauze sponges, not tissues or cotton balls. Do not Gladu, Melinda E. ([id number]) scrub or use excessive force. Pat dry using gauze sponges, not tissue or cotton balls. Peri-Wound Care Topical Primary Dressing Prisma 4.34 (in) Discharge  Instruction: Moisten w/normal saline  or sterile water; Cover wound as directed. Do not remove from wound bed. Secondary Dressing [place] Roll-Medium Discharge Instruction: Apply Conforming Stretch Guaze Bandage as directed Gauze Discharge Instruction: As directed: dry, moistened with saline or moistened with Dakins Solution Secured With 16M Medipore H Soft Cloth Surgical Tape, 2x2 (in/yd) Compression Wrap Compression Stockings Add-Ons Electronic Signature(s) Signed: 06/30/2020 5:56:34 PM By: [name], [name], RN, [name], [name] RN, [name] Entered By: [name], [name], RN, [name], [name] on 06/29/2020 11:47:50 Melinda [name] ([id number]) -------------------------------------------------------------------------------- Vitals Details Patient Name: Melinda [name] Date of Service: 06/29/2020 11:15 AM Medical Record Number: [id number] Patient Account Number: [id number] Date of Birth/Sex: Jan 08, 1956 (65 y.o. F) Treating RN: [name] Primary Care Onalee Steinbach: [name] Other Clinician: Referring Vashaun Osmon: [name] Treating Jaleah Lefevre/Extender: Skipper Cliche in Treatment: 28 Vital Signs Time Taken: 11:44 Temperature (F): 98.2 Height (in): 69 Pulse (bpm): 109 Respiratory Rate (breaths/min): 18 Blood Pressure (mmHg): 105/70 Reference Range: 80 - 120 mg / dl Electronic Signature(s) Signed: 06/30/2020 5:56:34 PM By: [name], [name], RN, [name], [name] RN, [name] Entered By: [name], [name], RN, [name], [name] on 06/29/2020 11:45:28
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
## 2020-06-29 NOTE — Addendum Note (Signed)
Addended by: Darlyne Russian on: 06/29/2020 11:58 AM   Modules accepted: Orders

## 2020-06-30 ENCOUNTER — Encounter: Payer: Self-pay | Admitting: Cardiology

## 2020-06-30 ENCOUNTER — Ambulatory Visit: Payer: Medicare HMO | Admitting: Cardiology

## 2020-06-30 VITALS — BP 112/78 | HR 106 | Ht 68.5 in | Wt 251.0 lb

## 2020-06-30 DIAGNOSIS — I4892 Unspecified atrial flutter: Secondary | ICD-10-CM | POA: Diagnosis not present

## 2020-06-30 DIAGNOSIS — I483 Typical atrial flutter: Secondary | ICD-10-CM | POA: Diagnosis not present

## 2020-06-30 DIAGNOSIS — Z6841 Body Mass Index (BMI) 40.0 and over, adult: Secondary | ICD-10-CM | POA: Diagnosis not present

## 2020-06-30 DIAGNOSIS — I5022 Chronic systolic (congestive) heart failure: Secondary | ICD-10-CM

## 2020-06-30 DIAGNOSIS — I25118 Atherosclerotic heart disease of native coronary artery with other forms of angina pectoris: Secondary | ICD-10-CM

## 2020-06-30 NOTE — Progress Notes (Signed)
Electrophysiology Office Note:    Date:  06/30/2020   ID:  Christal Lagerstrom, DOB 02/25/1956, MRN [id number]  PCP:  Donnie Coffin, MD  [facility] Cardiologist:  Ida Rogue, MD  [facility] Electrophysiologist:  Vickie Epley, MD   Referring MD: Donnie Coffin, MD   Chief Complaint: atrial flutter  History of Present Illness:    Melinda [name] is a 65 y.o. female who presents for an evaluation of atrial flutter at the request of Dr Rockey Situ. Their medical history includes CAD post CABG in 2016, GERD, HTN, HLD, DM, obesity and severe PAD.  The patient last saw Dr Rockey Situ 06/18/2020.  Her hx of AFL dates back to 03/2020. She has previously had 2 cardioversions, most recently 05/28/2020 while taking amiodarone. She stayed in normal rhythm for 3 days before returning to atrial flutter.  Today she tells me that she is highly symptomatic when out of rhythm. She feels tired, dyspneic and anxious when in atrial flutter. She has also developed symptoms of HF with edema and dyspnea.  Past Medical History:  Diagnosis Date  . Anxiety   . Atrial fibrillation ([place])   . Atrial flutter ([place])   . Coronary artery disease    a. 06/2014 NSTEMI s/p CABG x 4 (LIMA to LAD, VG to Diag, VG to OM, VG to PDA).  . Depression   . Diabetic neuropathy ([place])   . GERD (gastroesophageal reflux disease)   . HTN (hypertension)   . Hyperlipidemia LDL goal <70   . IDDM (insulin dependent diabetes mellitus)   . Ischemic cardiomyopathy    a. 06/2014 Echo: EF 40-45%, HK of entire inferolateral and inferior myocardium c/w infarct of RCA/LCx, GR2DD, mild MR  . Myocardial infarction ([place]) 2016  . OA (osteoarthritis) of knee   . Obesity   . Osteoarthritis   . Osteomyelitis ([place])   . Peripheral neuropathy   . Peripheral vascular disease ([place])    a. 09/2016 Periph Angio: CTO R popliteal, CTO L prox/mid SFA->Med Rx; b. 05/2017 PTA: LSFA (Viabahn covered stent x 2), DEB to L Post Tibial; c. 06/2017 ABI: R 0.44,  L 1.05.  . Tobacco abuse     Past Surgical History:  Procedure Laterality Date  . ABDOMINAL AORTOGRAM W/LOWER EXTREMITY N/A 10/27/2016   Procedure: Abdominal Aortogram w/Lower Extremity;  Surgeon: Nelva Bush, MD;  Location: [place] [facility];  Service: Cardiovascular;  Laterality: N/A;  . AMPUTATION TOE Left 10/21/2015   Procedure: AMPUTATION TOE;  Surgeon: Sharlotte Alamo, DPM;  Location: [facility];  Service: Podiatry;  Laterality: Left;  . AMPUTATION TOE Left 05/15/2017   Procedure: AMPUTATION LEFT GREAT TOE;  Surgeon: Sharlotte Alamo, DPM;  Location: [facility];  Service: Podiatry;  Laterality: Left;  . AORTIC VALVE REPLACEMENT (AVR)/CORONARY ARTERY BYPASS GRAFTING (CABG)    . BREAST BIOPSY    . CARDIAC CATHETERIZATION  06/2014   95% stenosis mLAD, occlusion ostial OM1, 70% stenosis LCx, 95% stenosis mRCA, EF 45%.  Marland Kitchen CARDIOVERSION N/A 04/14/2020   Procedure: CARDIOVERSION with TEE;  Surgeon: Minna Merritts, MD;  Location: [facility];  Service: Cardiovascular;  Laterality: N/A;  . CARDIOVERSION N/A 05/28/2020   Procedure: CARDIOVERSION;  Surgeon: Minna Merritts, MD;  Location: [facility];  Service: Cardiovascular;  Laterality: N/A;  . CORONARY ARTERY BYPASS GRAFT N/A 06/08/2014   Procedure: CORONARY ARTERY BYPASS GRAFTING (CABG);  Surgeon: Grace Isaac, MD;  Location: [place];  Service: Open Heart Surgery;  Laterality: N/A;  Times 4 using  left internal mammary artery to LAD artery and endoscopically harvested bilateral saphenous vein to Obtuse Marginal, Diagonal and Posterior Descending coronary arteries.  . CT ABD W & PELVIS WO CM  06/2014   nl liver, gallbladder, spleen, mild diverticular changes, no bowel wall inflammation, appendix nl, no hernia, no other sig abnormalities  . GASTRIC ROUX-EN-Y N/A 02/09/2020   Procedure: LAPAROSCOPIC ROUX-EN-Y GASTRIC BYPASS WITH UPPER ENDOSCOPY;  Surgeon: Johnathan Hausen, MD;  Location: [facility];  Service: General;  Laterality: N/A;  . LOWER EXTREMITY  ANGIOGRAPHY Left 05/23/2017   Procedure: LOWER EXTREMITY ANGIOGRAPHY;  Surgeon: Algernon Huxley, MD;  Location: [place] [facility];  Service: Cardiovascular;  Laterality: Left;  . LOWER EXTREMITY ANGIOGRAPHY Left 05/28/2017   Procedure: LOWER EXTREMITY ANGIOGRAPHY;  Surgeon: Algernon Huxley, MD;  Location: [place] [facility];  Service: Cardiovascular;  Laterality: Left;  . LOWER EXTREMITY ANGIOGRAPHY Left 12/16/2018   Procedure: LOWER EXTREMITY ANGIOGRAPHY;  Surgeon: Algernon Huxley, MD;  Location: [place] [facility];  Service: Cardiovascular;  Laterality: Left;  . LOWER EXTREMITY ANGIOGRAPHY Right 12/23/2018   Procedure: LOWER EXTREMITY ANGIOGRAPHY;  Surgeon: Algernon Huxley, MD;  Location: [place] [facility];  Service: Cardiovascular;  Laterality: Right;  . TEE WITHOUT CARDIOVERSION N/A 06/08/2014   Procedure: TRANSESOPHAGEAL ECHOCARDIOGRAM (TEE);  Surgeon: Grace Isaac, MD;  Location: [place];  Service: Open Heart Surgery;  Laterality: N/A;  . TEE WITHOUT CARDIOVERSION N/A 04/14/2020   Procedure: TRANSESOPHAGEAL ECHOCARDIOGRAM (TEE);  Surgeon: Minna Merritts, MD;  Location: [facility];  Service: Cardiovascular;  Laterality: N/A;  . TOE AMPUTATION Right 12/2011   rt middle toe  . UPPER GI ENDOSCOPY N/A 02/09/2020   Procedure: UPPER GI ENDOSCOPY;  Surgeon: Johnathan Hausen, MD;  Location: [facility];  Service: General;  Laterality: N/A;  . US ECHOCARDIOGRAPHY  06/2014   EF 50-55%, HK of inf/post/inferolat walls, Ao sclerosis    Current Medications: Current Meds  Medication Sig  . ACCU-CHEK AVIVA PLUS test strip 1 each 3 (three) times daily.  . Accu-Chek Softclix Lancets lancets 1 each 3 (three) times daily.  . Alcohol Swabs (B-D SINGLE USE SWABS REGULAR) PADS Apply topically.  Marland Kitchen amiodarone (PACERONE) 200 MG tablet Take 1 tablet (200 mg total) by mouth 2 (two) times daily.  Marland Kitchen amLODipine (NORVASC) 5 MG tablet Take 1 tablet by mouth daily.  Marland Kitchen apixaban (ELIQUIS) 5 MG TABS tablet Take 1 tablet (5 mg  total) by mouth 2 (two) times daily.  Marland Kitchen atorvastatin (LIPITOR) 80 MG tablet Take 1 tablet (80 mg total) by mouth daily at 6 PM.  . Cholecalciferol (VITAMIN D3) 50 MCG (2000 UT) TABS Take 2,000 Units by mouth daily.  . clobetasol (TEMOVATE) 0.05 % external solution Patient to mix entire bottle with 1 jar of CeraVe and apply twice a day x 1 month then as needed. Avoid face, groin, underarms. (Patient taking differently: Apply 1 application topically See admin instructions. Patient to mix entire bottle with 1 jar of CeraVe and apply twice a day x 1 month then as needed. Avoid face, groin, underarms.)  . clotrimazole (LOTRIMIN) 1 % cream Apply  as directed to affected area twice a day  for fungal infection  . docusate sodium (COLACE) 100 MG capsule Take 100 mg by mouth 2 (two) times daily.  . DROPLET PEN NEEDLES 31G X 6 MM MISC   . DULoxetine (CYMBALTA) 60 MG capsule Take 60 mg by mouth daily.   . Eflornithine HCl 13.9 % cream Apply to  upper lip, chin and neck twice a day. (Patient taking differently: Apply 1 application topically 2 (two) times daily. Apply to upper lip, chin and neck twice a day.)  . Emollient (CERAVE EX) Apply 1 application topically in the morning and at bedtime.  . ferrous sulfate 325 (65 FE) MG tablet Take 325 mg by mouth daily.  . furosemide (LASIX) 20 MG tablet Take 2 tablets (40 mg total) by mouth 2 (two) times daily as needed (fluid retention.). (Patient taking differently: Take 20 mg by mouth 2 (two) times daily as needed (fluid retention.).)  . insulin aspart (NOVOLOG) 100 UNIT/ML injection Inject 0-20 Units into the skin every 4 (four) hours. (Patient taking differently: Inject 3-7 Units into the skin every 4 (four) hours. Sliding scale)  . lidocaine (LIDODERM) 5 % Place 1 patch onto the skin daily as needed (PAIN.).   Marland Kitchen LYRICA 150 MG capsule Take 1 capsule by mouth three times a day  for nerve pain  . metoprolol tartrate (LOPRESSOR) 50 MG tablet Take 1 tablet (50 mg total) by  mouth 2 (two) times daily.  . Multiple Vitamins-Minerals (MULTIVITAMIN WITH MINERALS) tablet Take 2 tablets by mouth in the morning and at bedtime.  . ondansetron (ZOFRAN-ODT) 4 MG disintegrating tablet Take 1 tablet (4 mg total) by mouth every 6 (six) hours as needed for nausea or vomiting.  Marland Kitchen oxybutynin (DITROPAN) 5 MG tablet Take 5 mg by mouth 2 (two) times daily.   Marland Kitchen OZEMPIC, 0.25 OR 0.5 MG/DOSE, 2 MG/1.5ML SOPN Inject 0.25 mg subcutaneously once a week  for 4 weeks, then increase to 0.5 mg once weekly  . pantoprazole (PROTONIX) 40 MG tablet Take 1 tablet (40 mg total) by mouth daily.  . potassium chloride SA (KLOR-CON) 20 MEQ tablet TAKE 1 TABLET (20 MEQ TOTAL) BY MOUTH DAILY AS NEEDED. (Patient taking differently: Take 20 mEq by mouth daily as needed.)  . tiZANidine (ZANAFLEX) 4 MG tablet Take 4 mg by mouth 3 (three) times daily.  . varenicline (CHANTIX CONTINUING MONTH PAK) 1 MG tablet Take 1 tablet (1 mg total) by mouth 2 (two) times daily.     Allergies:   Patient has no known allergies.   Social History   Socioeconomic History  . Marital status: Widowed    Spouse name: Not on file  . Number of children: Not on file  . Years of education: Not on file  . Highest education level: Not on file  Occupational History  . Not on file  Tobacco Use  . Smoking status: Former Smoker    Packs/day: 0.50    Years: 40.00    Pack years: 20.00    Types: Cigarettes  . Smokeless tobacco: Never Used  . Tobacco comment: started Chantix 04/12/20  Vaping Use  . Vaping Use: Never used  Substance and Sexual Activity  . Alcohol use: No    Alcohol/week: 0.0 standard drinks  . Drug use: Not Currently    Comment: crack  . Sexual activity: Not on file  Other Topics Concern  . Not on file  Social History Narrative  . Not on file   Social Determinants of Health   Financial Resource Strain: Not on file  Food Insecurity: Not on file  Transportation Needs: Not on file  Physical Activity: Not on  file  Stress: Not on file  Social Connections: Not on file     Family History: The patient's family history includes Asthma in an other family member; CAD in her father and mother;  Diabetes in her father and mother; Hypertension in her mother; Sickle cell trait in an other family member.  ROS:   Please see the history of present illness.    All other systems reviewed and are negative.  EKGs/Labs/Other Studies Reviewed:    The following studies were reviewed today:  03/02/2020 TTE EF moderately decreased 35% Basal inferior and inferolateral hypokinesis RV function mildly reduced LA/RA mildly dilated   EKG:  The ekg ordered today demonstrates atrial flutter with 2:1 conduction. Single PVC. Flutter waves difficult to assess. In the inferior leads the flutter waves appear to be c/w typical flutter although in V1 they are difficult to see.  Recent Labs: 04/13/2020: ALT 19; TSH 1.122 05/26/2020: BUN 16; Creatinine, Ser 1.34; Hemoglobin 12.2; Platelets 295; Potassium 5.0; Sodium 137  Recent Lipid Panel    Component Value Date/Time   CHOL 74 04/13/2020 0715   CHOL 214 (H) 10/10/2016 1911   CHOL 94 06/02/2014 0405   TRIG 89 04/13/2020 0715   TRIG 122 06/02/2014 0405   HDL 33 (L) 04/13/2020 0715   HDL 42 10/10/2016 1911   HDL 39 (L) 06/02/2014 0405   CHOLHDL 2.2 04/13/2020 0715   VLDL 18 04/13/2020 0715   VLDL 24 06/02/2014 0405   LDLCALC 23 04/13/2020 0715   LDLCALC 103 (H) 10/10/2016 1911   LDLCALC 31 06/02/2014 0405   LDLDIRECT 27.8 04/13/2020 0715    Physical Exam:    VS:  BP 112/78 (BP Location: Left Arm, Patient Position: Sitting, Cuff Size: Normal)   Pulse (!) 106   Ht 5' 8.5" (1.74 m)   Wt 251 lb (113.9 kg)   SpO2 92%   BMI 37.61 kg/m     Wt Readings from Last 3 Encounters:  06/30/20 251 lb (113.9 kg)  06/18/20 256 lb (116.1 kg)  05/14/20 266 lb (120.7 kg)     GEN:  Well nourished, well developed in no acute distress HEENT: Normal NECK: No JVD; No carotid  bruits LYMPHATICS: No lymphadenopathy CARDIAC: regular, tachycardic, no murmurs, rubs, gallops RESPIRATORY:  Clear to auscultation without rales, wheezing or rhonchi  ABDOMEN: Soft, non-tender, non-distended MUSCULOSKELETAL:  No edema; No deformity  SKIN: Warm and dry NEUROLOGIC:  Alert and oriented x 3 PSYCHIATRIC:  Normal affect   ASSESSMENT:    1. Atrial flutter, unspecified type ([place])   2. Typical atrial flutter ([place])   3. Coronary artery disease of native artery of native heart with stable angina pectoris ([place])   4. BMI 40.0-44.9, adult ([place])   5. Chronic systolic heart failure ([facility])    PLAN:    In order of problems listed above:  1. Atrial flutter, persistent Symptomatic. EF moderately decreased in setting of uncontrolled ventricular rates. A rhythm control strategy is indicated. We discussed using ablation to augment the pharmacologic therapy that she is currently on. Would plan for an EP study and CTI ablation. From the ECG, I suspect her AFL is typical but cannot completely exclude atypical flutters. Will plan for a full EP study to confirm no other inducible flutters. I will plan to get a CT cardiac prior to the procedure to assess LA anatomy in case I need to go left sided.  In the meantime, she will continue her apixaban and amiodarone.  I discussed the ablation procedure in detail with the patient during today's visit including the possibility of atrial fibrillation emerging after a successful atrial flutter ablation.  Risk, benefits, and alternatives to EP study and radiofrequency ablation for atrial flutter were  also discussed in detail today. These risks include but are not limited to stroke, bleeding, vascular damage, tamponade, perforation, damage to the esophagus, lungs, and other structures, pulmonary vein stenosis, worsening renal function, and death. The patient understands these risk and wishes to proceed.  We will therefore proceed with catheter ablation at the  next available time.  Carto, ICE, anesthesia are requested for the procedure.  Will also obtain CT PV protocol prior to the procedure to exclude LAA thrombus and further evaluate atrial anatomy.  2. CAD No ischemic symptoms today. Continue aspirin.  3. Chronic combined systolic and diastolic heart failure Warm and dry today. NYHA II symptoms. Suspect there is a component of tachy induced cardiomyopathy contributing to her LV dysfunction. Plan for EP study and AFL ablation as above. Continue amiodarone. Will need to repeat echo 3 months after ablation to reassess LV function. If LV EF still severely depressed, will need to determine need for ICD for primary ppx.   Medication Adjustments/Labs and Tests Ordered: Current medicines are reviewed at length with the patient today.  Concerns regarding medicines are outlined above.  Orders Placed This Encounter  Procedures  . CT CARDIAC MORPH/PULM VEIN W/CM&W/O CA SCORE  . Basic Metabolic Panel (BMET)  . CBC w/Diff  . EKG 12-Lead   No orders of the defined types were placed in this encounter.    Signed, Lars Mage, MD, [facility]  06/30/2020 8:09 PM    Electrophysiology [place]

## 2020-06-30 NOTE — Patient Instructions (Addendum)
Medication Instructions:  Your physician recommends that you continue on your current medications as directed. Please refer to the Current Medication list given to you today. *If you need a refill on your cardiac medications before your next appointment, please call your pharmacy*  Lab Work: None ordered. If you have labs (blood work) drawn today and your tests are completely normal, you will receive your results only by: Marland Kitchen MyChart Message (if you have MyChart) OR . A paper copy in the mail If you have any lab test that is abnormal or we need to change your treatment, we will call you to review the results.  Testing/Procedures: None ordered.  Follow-Up: At [facility], you and your health needs are our priority.  As part of our continuing mission to provide you with exceptional heart care, we have created designated Provider Care Teams.  These Care Teams include your primary Cardiologist (physician) and Advanced Practice Providers (APPs -  Physician Assistants and Nurse Practitioners) who all work together to provide you with the care you need, when you need it.  Your next appointment:    SEE INSTRUCTION LETTER   Cardiac electrophysiology: From cell to bedside (7th ed., pp. 5638-7564). [place], [place]: Elsevier.">  Cardiac Ablation Cardiac ablation is a procedure to destroy, or ablate, a small amount of heart tissue in very specific places. The heart has many electrical connections. Sometimes these connections are abnormal and can cause the heart to beat very fast or irregularly. Ablating some of the areas that cause problems can improve the heart's rhythm or return it to normal. Ablation may be done for people who:  Have Wolff-Parkinson-White syndrome.  Have fast heart rhythms (tachycardia).  Have taken medicines for an abnormal heart rhythm (arrhythmia) that were not effective or caused side effects.  Have a high-risk heartbeat that may be life-threatening. During the  procedure, a small incision is made in the neck or the groin, and a long, thin tube (catheter) is inserted into the incision and moved to the heart. Small devices (electrodes) on the tip of the catheter will send out electrical currents. A type of X-ray (fluoroscopy) will be used to help guide the catheter and to provide images of the heart. Tell a health care provider about:  Any allergies you have.  All medicines you are taking, including vitamins, herbs, eye drops, creams, and over-the-counter medicines.  Any problems you or family members have had with anesthetic medicines.  Any blood disorders you have.  Any surgeries you have had.  Any medical conditions you have, such as kidney failure.  Whether you are pregnant or may be pregnant. What are the risks? Generally, this is a safe procedure. However, problems may occur, including:  Infection.  Bruising and bleeding at the catheter insertion site.  Bleeding into the chest, especially into the sac that surrounds the heart. This is a serious complication.  Stroke or blood clots.  Damage to nearby structures or organs.  Allergic reaction to medicines or dyes.  Need for a permanent pacemaker if the normal electrical system is damaged. A pacemaker is a small computer that sends electrical signals to the heart and helps your heart beat normally.  The procedure not being fully effective. This may not be recognized until months later. Repeat ablation procedures are sometimes done. What happens before the procedure? Medicines Ask your health care provider about:  Changing or stopping your regular medicines. This is especially important if you are taking diabetes medicines or blood thinners.  Taking medicines such  as aspirin and ibuprofen. These medicines can thin your blood. Do not take these medicines unless your health care provider tells you to take them.  Taking over-the-counter medicines, vitamins, herbs, and  supplements. General instructions  Follow instructions from your health care provider about eating or drinking restrictions.  Plan to have someone take you home from the hospital or clinic.  If you will be going home right after the procedure, plan to have someone with you for 24 hours.  Ask your health care provider what steps will be taken to prevent infection. What happens during the procedure?  An IV will be inserted into one of your veins.  You will be given a medicine to help you relax (sedative).  The skin on your neck or groin will be numbed.  An incision will be made in your neck or your groin.  A needle will be inserted through the incision and into a large vein in your neck or groin.  A catheter will be inserted into the needle and moved to your heart.  Dye may be injected through the catheter to help your surgeon see the area of the heart that needs treatment.  Electrical currents will be sent from the catheter to ablate heart tissue in desired areas. There are three types of energy that may be used to do this: ? Heat (radiofrequency energy). ? Laser energy. ? Extreme cold (cryoablation).  When the tissue has been ablated, the catheter will be removed.  Pressure will be held on the insertion area to prevent a lot of bleeding.  A bandage (dressing) will be placed over the insertion area. The exact procedure may vary among health care providers and hospitals.   What happens after the procedure?  Your blood pressure, heart rate, breathing rate, and blood oxygen level will be monitored until you leave the hospital or clinic.  Your insertion area will be monitored for bleeding. You will need to lie still for a few hours to ensure that you do not bleed from the insertion area.  Do not drive for 24 hours or as long as told by your health care provider. Summary  Cardiac ablation is a procedure to destroy, or ablate, a small amount of heart tissue using an electrical  current. This procedure can improve the heart rhythm or return it to normal.  Tell your health care provider about any medical conditions you may have and all medicines you are taking to treat them.  This is a safe procedure, but problems may occur. Problems may include infection, bruising, damage to nearby organs or structures, or allergic reactions to medicines.  Follow your health care provider's instructions about eating and drinking before the procedure. You may also be told to change or stop some of your medicines.  After the procedure, do not drive for 24 hours or as long as told by your health care provider. This information is not intended to replace advice given to you by your health care provider. Make sure you discuss any questions you have with your health care provider. Document Revised: 02/24/2019 Document Reviewed: 02/24/2019 Elsevier Patient Education  [place].

## 2020-07-01 ENCOUNTER — Other Ambulatory Visit: Payer: Self-pay

## 2020-07-01 MED ORDER — PANTOPRAZOLE SODIUM 40 MG PO TBEC: 40.0000 mg | DELAYED_RELEASE_TABLET | Freq: Every day | ORAL | 0 refills | Status: DC

## 2020-07-06 ENCOUNTER — Other Ambulatory Visit: Payer: Self-pay

## 2020-07-06 ENCOUNTER — Encounter: Payer: Medicare HMO | Admitting: Physician Assistant

## 2020-07-06 DIAGNOSIS — E11621 Type 2 diabetes mellitus with foot ulcer: Secondary | ICD-10-CM | POA: Diagnosis not present
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
## 2020-07-07 NOTE — Progress Notes (Signed)
Melinda [name], Melinda [name] ([id number]) Visit Report for 07/06/2020 Arrival Information Details Patient Name: Melinda [name], Melinda [name] Date of Service: 07/06/2020 10:30 AM Medical Record Number: [id number] Patient Account Number: [id number] Date of Birth/Sex: 1955/09/18 (64 y.o. F) Treating RN: Carlene Coria Primary Care Sebastion Jun: [name] Other Clinician: Jeanine Luz Referring Rebecah Dangerfield: [name] Treating Jeron Grahn/Extender: Skipper Cliche in Treatment: 29 Visit Information History Since Last Visit All ordered tests and consults were completed: No Patient Arrived: Ambulatory Added or deleted any medications: No Arrival Time: 10:36 Any new allergies or adverse reactions: No Accompanied By: self Had a fall or experienced change in No Transfer Assistance: None activities of daily living that may affect Patient Identification Verified: Yes risk of falls: Secondary Verification Process Completed: Yes Signs or symptoms of abuse/neglect since last visito No Patient Requires Transmission-Based No Hospitalized since last visit: No Precautions: Implantable device outside of the clinic excluding No Patient Has Alerts: Yes cellular tissue based products placed in the center Patient Alerts: 10/28/19 ABI: R 1.05 since last visit: L .60 Has Dressing in Place as Prescribed: Yes Pain Present Now: No Electronic Signature(s) Signed: 07/07/2020 12:09:52 PM By: Carlene Coria RN Entered By: Carlene Coria on 07/06/2020 10:37:21 Melinda [name] ([id number]) -------------------------------------------------------------------------------- Clinic Level of Care Assessment Details Patient Name: Melinda [name] Date of Service: 07/06/2020 10:30 AM Medical Record Number: [id number] Patient Account Number: [id number] Date of Birth/Sex: 1955-12-06 (64 y.o. F) Treating RN: Dolan Amen Primary Care Skyeler Scalese: [name] Other Clinician: Jeanine Luz Referring Demitrus Francisco: [name] Treating Conor Filsaime/Extender:  Skipper Cliche in Treatment: 29 Clinic Level of Care Assessment Items TOOL 1 Quantity Score []  - Use when EandM and Procedure is performed on INITIAL visit 0 ASSESSMENTS - Nursing Assessment / Reassessment []  - General Physical Exam (combine w/ comprehensive assessment (listed just below) when performed on new 0 pt. evals) []  - 0 Comprehensive Assessment (HX, ROS, Risk Assessments, Wounds Hx, etc.) ASSESSMENTS - Wound and Skin Assessment / Reassessment []  - Dermatologic / Skin Assessment (not related to wound area) 0 ASSESSMENTS - Ostomy and/or Continence Assessment and Care []  - Incontinence Assessment and Management 0 []  - 0 Ostomy Care Assessment and Management (repouching, etc.) PROCESS - Coordination of Care []  - Simple Patient / Family Education for ongoing care 0 []  - 0 Complex (extensive) Patient / Family Education for ongoing care []  - 0 Staff obtains Programmer, systems, Records, Test Results / Process Orders []  - 0 Staff telephones HHA, Nursing Homes / Clarify orders / etc []  - 0 Routine Transfer to another Facility (non-emergent condition) []  - 0 Routine Hospital Admission (non-emergent condition) []  - 0 New Admissions / Biomedical engineer / Ordering NPWT, Apligraf, etc. []  - 0 Emergency Hospital Admission (emergent condition) PROCESS - Special Needs []  - Pediatric / Minor Patient Management 0 []  - 0 Isolation Patient Management []  - 0 Hearing / Language / Visual special needs []  - 0 Assessment of Community assistance (transportation, D/C planning, etc.) []  - 0 Additional assistance / Altered mentation []  - 0 Support Surface(s) Assessment (bed, cushion, seat, etc.) INTERVENTIONS - Miscellaneous []  - External ear exam 0 []  - 0 Patient Transfer (multiple staff / Civil Service fast streamer / Similar devices) []  - 0 Simple Staple / Suture removal (25 or less) []  - 0 Complex Staple / Suture removal (26 or more) []  - 0 Hypo/Hyperglycemic Management (do not check if billed  separately) []  - 0 Ankle / Brachial Index (ABI) - do not check if billed separately Has the patient been seen at  the hospital within the last three years: Yes Total Score: 0 Level Of Care: ____ Melinda [name] ([id number]) Electronic Signature(s) Signed: 07/07/2020 9:05:11 AM By: Georges Mouse, Minus Breeding RN Entered By: Georges Mouse, Minus Breeding on 07/06/2020 11:12:42 Melinda [name] ([id number]) -------------------------------------------------------------------------------- Encounter Discharge Information Details Patient Name: Melinda [name], Melinda [name] Date of Service: 07/06/2020 10:30 AM Medical Record Number: [id number] Patient Account Number: [id number] Date of Birth/Sex: 11/02/1955 (64 y.o. F) Treating RN: Dolan Amen Primary Care Arlissa Monteverde: [name] Other Clinician: Jeanine Luz Referring Willa Brocks: [name] Treating Keondria Siever/Extender: Skipper Cliche in Treatment: 29 Encounter Discharge Information Items Post Procedure Vitals Discharge Condition: Stable Temperature (F): 98.7 Ambulatory Status: Ambulatory Pulse (bpm): 108 Discharge Destination: Home Respiratory Rate (breaths/min): 16 Transportation: Private Auto Blood Pressure (mmHg): 104/64 Accompanied By: self Schedule Follow-up Appointment: Yes Clinical Summary of Care: Electronic Signature(s) Signed: 07/07/2020 9:05:11 AM By: Georges Mouse, Minus Breeding RN Entered By: Georges Mouse, Minus Breeding on 07/06/2020 11:14:17 Melinda [name] ([id number]) -------------------------------------------------------------------------------- Lower Extremity Assessment Details Patient Name: Melinda [name] Date of Service: 07/06/2020 10:30 AM Medical Record Number: [id number] Patient Account Number: [id number] Date of Birth/Sex: 1955-08-17 (64 y.o. F) Treating RN: Carlene Coria Primary Care Jameson Tormey: [name] Other Clinician: Jeanine Luz Referring Koltan Portocarrero: [name] Treating Jeneva Schweizer/Extender: Skipper Cliche in Treatment:  29 Electronic Signature(s) Signed: 07/07/2020 12:09:52 PM By: Carlene Coria RN Entered By: Carlene Coria on 07/06/2020 10:48:47 Melinda [name] ([id number]) -------------------------------------------------------------------------------- Multi Wound Chart Details Patient Name: Melinda [name] Date of Service: 07/06/2020 10:30 AM Medical Record Number: [id number] Patient Account Number: [id number] Date of Birth/Sex: Aug 10, 1955 (64 y.o. F) Treating RN: Dolan Amen Primary Care Emile Ringgenberg: [name] Other Clinician: Jeanine Luz Referring Mashayla Lavin: [name] Treating Lilliana Turner/Extender: Skipper Cliche in Treatment: 29 Vital Signs Height(in): 69 Pulse(bpm): 108 Weight(lbs): Blood Pressure(mmHg): 104/64 Body Mass Index(BMI): Temperature(F): 98.7 Respiratory Rate(breaths/min): 16 Photos: [N/A:N/A] Wound Location: Left, Plantar Toe Great N/A N/A Wounding Event: Gradually Appeared N/A N/A Primary Etiology: Diabetic Wound/Ulcer of the Lower N/A N/A Extremity Comorbid History: Cataracts, Coronary Artery Disease, N/A N/A Hypertension, Myocardial Infarction, Peripheral Venous Disease, Type II Diabetes, Osteoarthritis, Osteomyelitis, Neuropathy Date Acquired: 12/02/2019 N/A N/A Weeks of Treatment: 29 N/A N/A Wound Status: Open N/A N/A Measurements L x W x D (cm) 0.8x0.3x0.2 N/A N/A Area (cm) : 0.188 N/A N/A Volume (cm) : 0.038 N/A N/A % Reduction in Area: 92.50% N/A N/A % Reduction in Volume: 98.30% N/A N/A Classification: Grade 2 N/A N/A Exudate Amount: Medium N/A N/A Exudate Type: Serosanguineous N/A N/A Exudate Color: red, brown N/A N/A Wound Margin: Thickened N/A N/A Granulation Amount: Large (67-100%) N/A N/A Granulation Quality: Pink N/A N/A Necrotic Amount: Small (1-33%) N/A N/A Exposed Structures: Fat Layer (Subcutaneous Tissue): N/A N/A Yes Fascia: No Tendon: No Muscle: No Joint: No Bone: No Epithelialization: Large (67-100%) N/A N/A Treatment  Notes Electronic Signature(s) Signed: 07/07/2020 9:05:11 AM By: Georges Mouse, Minus Breeding RN Entered By: Georges Mouse, Minus Breeding on 07/06/2020 11:10:59 Melinda [name] ([id number]) Melinda [name], Melinda [name] ([id number]) -------------------------------------------------------------------------------- [place] Details Patient Name: Melinda [name] Date of Service: 07/06/2020 10:30 AM Medical Record Number: [id number] Patient Account Number: [id number] Date of Birth/Sex: 02-24-1956 (64 y.o. F) Treating RN: Dolan Amen Primary Care Devian Bartolomei: [name] Other Clinician: Jeanine Luz Referring Tristan Bramble: [name] Treating Bow Buntyn/Extender: Skipper Cliche in Treatment: 29 Active Inactive Necrotic Tissue Nursing Diagnoses: Impaired tissue integrity related to necrotic/devitalized tissue Goals: Necrotic/devitalized tissue will be minimized in the wound bed Date Initiated: 12/16/2019 Target Resolution Date: 12/26/2019 Goal Status:  Active Interventions: Assess patient pain level pre-, during and post procedure and prior to discharge Treatment Activities: Excisional debridement : 12/16/2019 Notes: Pressure Nursing Diagnoses: Knowledge deficit related to causes and risk factors for pressure ulcer development Knowledge deficit related to management of pressures ulcers Goals: Patient will remain free from development of additional pressure ulcers Date Initiated: 12/16/2019 Target Resolution Date: 12/26/2019 Goal Status: Active Interventions: Provide education on pressure ulcers Notes: Wound/Skin Impairment Nursing Diagnoses: Impaired tissue integrity Goals: Ulcer/skin breakdown will have a volume reduction of 30% by week 4 Date Initiated: 12/16/2019 Target Resolution Date: 01/16/2020 Goal Status: Active Interventions: Assess patient/caregiver ability to obtain necessary supplies Assess ulceration(s) every visit Treatment Activities: Skin care regimen initiated :  12/16/2019 Notes: Electronic Signature(s) Melinda [name], Melinda [name] ([id number]) Signed: 07/07/2020 9:05:11 AM By: Georges Mouse, Minus Breeding RN Entered By: Georges Mouse, Minus Breeding on 07/06/2020 11:10:45 Melinda [name] ([id number]) -------------------------------------------------------------------------------- Pain Assessment Details Patient Name: Melinda [name] Date of Service: 07/06/2020 10:30 AM Medical Record Number: [id number] Patient Account Number: [id number] Date of Birth/Sex: April 19, 1956 (64 y.o. F) Treating RN: Carlene Coria Primary Care Ether Wolters: [name] Other Clinician: Jeanine Luz Referring Raynold Blankenbaker: [name] Treating Silva Aamodt/Extender: Skipper Cliche in Treatment: 29 Active Problems Location of Pain Severity and Description of Pain Patient Has Paino No Site Locations Pain Management and Medication Current Pain Management: Electronic Signature(s) Signed: 07/07/2020 12:09:52 PM By: Carlene Coria RN Entered By: Carlene Coria on 07/06/2020 10:37:59 Melinda [name] ([id number]) -------------------------------------------------------------------------------- Patient/Caregiver Education Details Patient Name: Melinda [name] Date of Service: 07/06/2020 10:30 AM Medical Record Number: [id number] Patient Account Number: [id number] Date of Birth/Gender: 1956-02-28 (64 y.o. F) Treating RN: Dolan Amen Primary Care Physician: [name] Other Clinician: Jeanine Luz Referring Physician: [name] Treating Physician/Extender: Skipper Cliche in Treatment: 29 Education Assessment Education Provided To: Patient Education Topics Provided Wound Debridement: Methods: Explain/Verbal Responses: State content correctly Electronic Signature(s) Signed: 07/07/2020 9:05:11 AM By: Georges Mouse, Minus Breeding RN Entered By: Georges Mouse, Minus Breeding on 07/06/2020 11:13:21 Melinda [name] ([id number]) -------------------------------------------------------------------------------- Wound  Assessment Details Patient Name: Melinda [name] Date of Service: 07/06/2020 10:30 AM Medical Record Number: [id number] Patient Account Number: [id number] Date of Birth/Sex: 12-19-1955 (64 y.o. F) Treating RN: Carlene Coria Primary Care Antonae Zbikowski: [name] Other Clinician: Jeanine Luz Referring Zhania Shaheen: [name] Treating Gwendolyn Mclees/Extender: Skipper Cliche in Treatment: 29 Wound Status Wound Number: 5 Primary Diabetic Wound/Ulcer of the Lower Extremity Etiology: Wound Location: Left, Plantar Toe Great Wound Open Wounding Event: Gradually Appeared Status: Date Acquired: 12/02/2019 Comorbid Cataracts, Coronary Artery Disease, Hypertension, Weeks Of Treatment: 29 History: Myocardial Infarction, Peripheral Venous Disease, Type II Clustered Wound: No Diabetes, Osteoarthritis, Osteomyelitis, Neuropathy Photos Wound Measurements Length: (cm) 0.8 Width: (cm) 0.3 Depth: (cm) 0.2 Area: (cm) 0.188 Volume: (cm) 0.038 % Reduction in Area: 92.5% % Reduction in Volume: 98.3% Epithelialization: Large (67-100%) Wound Description Classification: Grade 2 Wound Margin: Thickened Exudate Amount: Medium Exudate Type: Serosanguineous Exudate Color: red, brown Foul Odor After Cleansing: No Slough/Fibrino Yes Wound Bed Granulation Amount: Large (67-100%) Exposed Structure Granulation Quality: Pink Fascia Exposed: No Necrotic Amount: Small (1-33%) Fat Layer (Subcutaneous Tissue) Exposed: Yes Necrotic Quality: Adherent Slough Tendon Exposed: No Muscle Exposed: No Joint Exposed: No Bone Exposed: No Treatment Notes Wound #5 (Toe Great) Wound Laterality: Plantar, Left Cleanser Normal Saline Discharge Instruction: Wash your hands with soap and water. Remove old dressing, discard into plastic bag and place into trash. Cleanse the wound with Normal Saline prior to applying a clean dressing using gauze sponges,  not tissues or cotton balls. Do not Melinda [name], Melinda E. ([id number]) scrub  or use excessive force. Pat dry using gauze sponges, not tissue or cotton balls. Peri-Wound Care Topical Primary Dressing Prisma 4.34 (in) Discharge Instruction: Moisten w/normal saline or sterile water; Cover wound as directed. Do not remove from wound bed. Secondary Dressing [place] Roll-Medium Discharge Instruction: Apply Conforming Stretch Guaze Bandage as directed Gauze Discharge Instruction: As directed: dry, moistened with saline or moistened with Dakins Solution Secured With 50M Medipore H Soft Cloth Surgical Tape, 2x2 (in/yd) Compression Wrap Compression Stockings Add-Ons Electronic Signature(s) Signed: 07/07/2020 12:09:52 PM By: Carlene Coria RN Entered By: Carlene Coria on 07/06/2020 10:53:15 Melinda [name] ([id number]) -------------------------------------------------------------------------------- Vitals Details Patient Name: Melinda [name] Date of Service: 07/06/2020 10:30 AM Medical Record Number: [id number] Patient Account Number: [id number] Date of Birth/Sex: 02-11-1956 (64 y.o. F) Treating RN: Carlene Coria Primary Care Lyn Joens: [name] Other Clinician: Jeanine Luz Referring Artia Singley: [name] Treating Kalisi Bevill/Extender: Skipper Cliche in Treatment: 29 Vital Signs Time Taken: 10:37 Temperature (F): 98.7 Height (in): 69 Pulse (bpm): 108 Respiratory Rate (breaths/min): 16 Blood Pressure (mmHg): 104/64 Reference Range: 80 - 120 mg / dl Electronic Signature(s) Signed: 07/07/2020 12:09:52 PM By: Carlene Coria RN Entered By: Carlene Coria on 07/06/2020 10:37:46

## 2020-07-08 LAB — GLUCOSE, CAPILLARY: Glucose-Capillary: 90 mg/dL (ref 70–99)

## 2020-07-12 ENCOUNTER — Other Ambulatory Visit: Payer: Self-pay | Admitting: *Deleted

## 2020-07-12 DIAGNOSIS — I480 Paroxysmal atrial fibrillation: Secondary | ICD-10-CM

## 2020-07-12 MED ORDER — METOPROLOL TARTRATE 50 MG PO TABS: 50.0000 mg | ORAL_TABLET | Freq: Two times a day (BID) | ORAL | 1 refills | Status: DC

## 2020-07-13 ENCOUNTER — Encounter: Payer: Medicare HMO | Admitting: Physician Assistant

## 2020-07-13 ENCOUNTER — Other Ambulatory Visit: Payer: Self-pay

## 2020-07-13 DIAGNOSIS — E11621 Type 2 diabetes mellitus with foot ulcer: Secondary | ICD-10-CM | POA: Diagnosis not present

## 2020-07-13 NOTE — Progress Notes (Addendum)
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 Modifier: Quantity: 1 CPT4 Code: Description: ICD-10 Diagnosis Description 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

## 2020-07-13 NOTE — Progress Notes (Addendum)
Melinda [name], Melinda [name] ([id number]) Visit Report for 07/13/2020 Arrival Information Details Patient Name: Melinda [name], Melinda [name] Date of Service: 07/13/2020 3:00 PM Medical Record Number: [id number] Patient Account Number: [id number] Date of Birth/Sex: [birth year] (64 y.o. F) Treating RN: Carlene Coria Primary Care Ivone Licht: [name] Other Clinician: Jeanine Luz Referring Diego Ulbricht: [name] Treating Mckyle Solanki/Extender: Skipper Cliche in Treatment: 30 Visit Information History Since Last Visit All ordered tests and consults were completed: No Patient Arrived: Melinda [name] Added or deleted any medications: No Arrival Time: 15:10 Any new allergies or adverse reactions: No Accompanied By: self Had a fall or experienced change in No Transfer Assistance: None activities of daily living that may affect Patient Identification Verified: Yes risk of falls: Secondary Verification Process Completed: Yes Signs or symptoms of abuse/neglect since last visito No Patient Requires Transmission-Based No Hospitalized since last visit: No Precautions: Implantable device outside of the clinic excluding No Patient Has Alerts: Yes cellular tissue based products placed in the center Patient Alerts: 10/28/19 ABI: R 1.05 since last visit: L .60 Has Dressing in Place as Prescribed: Yes Has Compression in Place as Prescribed: Yes Pain Present Now: No Electronic Signature(s) Signed: 07/13/2020 4:27:39 PM By: Carlene Coria RN Entered By: Carlene Coria on 07/13/2020 15:12:04 Melinda [name] ([id number]) -------------------------------------------------------------------------------- Clinic Level of Care Assessment Details Patient Name: Melinda [name] Date of Service: 07/13/2020 3:00 PM Medical Record Number: [id number] Patient Account Number: [id number] Date of Birth/Sex: Mar 19, 1956 (64 y.o. F) Treating RN: Dolan Amen Primary Care Lanard Arguijo: [name] Other Clinician: Jeanine Luz Referring Yaritza Leist:  [name] Treating Haily Caley/Extender: Skipper Cliche in Treatment: 30 Clinic Level of Care Assessment Items TOOL 1 Quantity Score []  - Use when EandM and Procedure is performed on INITIAL visit 0 ASSESSMENTS - Nursing Assessment / Reassessment []  - General Physical Exam (combine w/ comprehensive assessment (listed just below) when performed on new 0 pt. evals) []  - 0 Comprehensive Assessment (HX, ROS, Risk Assessments, Wounds Hx, etc.) ASSESSMENTS - Wound and Skin Assessment / Reassessment []  - Dermatologic / Skin Assessment (not related to wound area) 0 ASSESSMENTS - Ostomy and/or Continence Assessment and Care []  - Incontinence Assessment and Management 0 []  - 0 Ostomy Care Assessment and Management (repouching, etc.) PROCESS - Coordination of Care []  - Simple Patient / Family Education for ongoing care 0 []  - 0 Complex (extensive) Patient / Family Education for ongoing care []  - 0 Staff obtains Programmer, systems, Records, Test Results / Process Orders []  - 0 Staff telephones HHA, Nursing Homes / Clarify orders / etc []  - 0 Routine Transfer to another Facility (non-emergent condition) []  - 0 Routine Hospital Admission (non-emergent condition) []  - 0 New Admissions / Biomedical engineer / Ordering NPWT, Apligraf, etc. []  - 0 Emergency Hospital Admission (emergent condition) PROCESS - Special Needs []  - Pediatric / Minor Patient Management 0 []  - 0 Isolation Patient Management []  - 0 Hearing / Language / Visual special needs []  - 0 Assessment of Community assistance (transportation, D/C planning, etc.) []  - 0 Additional assistance / Altered mentation []  - 0 Support Surface(s) Assessment (bed, cushion, seat, etc.) INTERVENTIONS - Miscellaneous []  - External ear exam 0 []  - 0 Patient Transfer (multiple staff / Civil Service fast streamer / Similar devices) []  - 0 Simple Staple / Suture removal (25 or less) []  - 0 Complex Staple / Suture removal (26 or more) []  -  0 Hypo/Hyperglycemic Management (do not check if billed separately) []  - 0 Ankle / Brachial Index (ABI) - do not check if billed  separately Has the patient been seen at the hospital within the last three years: Yes Total Score: 0 Level Of Care: ____ Melinda [name] ([id number]) Electronic Signature(s) Signed: 07/13/2020 4:53:32 PM By: Georges Mouse, Minus Breeding RN Entered By: Georges Mouse, Minus Breeding on 07/13/2020 15:44:04 Melinda [name] ([id number]) -------------------------------------------------------------------------------- Encounter Discharge Information Details Patient Name: Melinda [name] Date of Service: 07/13/2020 3:00 PM Medical Record Number: [id number] Patient Account Number: [id number] Date of Birth/Sex: 08/06/1955 (64 y.o. F) Treating RN: Dolan Amen Primary Care Lawanna Cecere: [name] Other Clinician: Jeanine Luz Referring Deshane Cotroneo: [name] Treating Averil Digman/Extender: Skipper Cliche in Treatment: 30 Encounter Discharge Information Items Post Procedure Vitals Discharge Condition: Stable Temperature (F): 98.2 Ambulatory Status: Ambulatory Pulse (bpm): 114 Discharge Destination: Home Respiratory Rate (breaths/min): 18 Transportation: Private Auto Blood Pressure (mmHg): 121/78 Accompanied By: self Schedule Follow-up Appointment: Yes Clinical Summary of Care: Electronic Signature(s) Signed: 07/13/2020 4:53:32 PM By: Georges Mouse, Minus Breeding RN Entered By: Georges Mouse, Minus Breeding on 07/13/2020 15:45:05 Melinda [name] ([id number]) -------------------------------------------------------------------------------- Lower Extremity Assessment Details Patient Name: Melinda [name] Date of Service: 07/13/2020 3:00 PM Medical Record Number: [id number] Patient Account Number: [id number] Date of Birth/Sex: 12/03/1955 (64 y.o. F) Treating RN: Carlene Coria Primary Care Brittanni Cariker: [name] Other Clinician: Jeanine Luz Referring Bettey Muraoka: [name] Treating  Catalyna Reilly/Extender: Skipper Cliche in Treatment: 30 Edema Assessment Assessed: [Left: No] [Right: No] Edema: [Left: Ye] [Right: s] Calf Left: Right: Point of Measurement: 38 cm From Medial Instep 41.3 cm Ankle Left: Right: Point of Measurement: 12 cm From Medial Instep 28.5 cm Knee To Floor Left: Right: From Medial Instep 49 cm Electronic Signature(s) Signed: 07/13/2020 4:27:39 PM By: Carlene Coria RN Entered By: Carlene Coria on 07/13/2020 15:24:44 Melinda [name] ([id number]) -------------------------------------------------------------------------------- Multi Wound Chart Details Patient Name: Melinda [name] Date of Service: 07/13/2020 3:00 PM Medical Record Number: [id number] Patient Account Number: [id number] Date of Birth/Sex: 1956-04-24 (64 y.o. F) Treating RN: Dolan Amen Primary Care Alazay Leicht: [name] Other Clinician: Jeanine Luz Referring Nikiah Goin: [name] Treating Lyrique Hakim/Extender: Skipper Cliche in Treatment: 30 Vital Signs Height(in): 69 Pulse(bpm): 114 Weight(lbs): Blood Pressure(mmHg): 121/78 Body Mass Index(BMI): Temperature(F): 98.2 Respiratory Rate(breaths/min): 18 Photos: [N/A:N/A] Wound Location: Left, Plantar Toe Great N/A N/A Wounding Event: Gradually Appeared N/A N/A Primary Etiology: Diabetic Wound/Ulcer of the Lower N/A N/A Extremity Comorbid History: Cataracts, Coronary Artery Disease, N/A N/A Hypertension, Myocardial Infarction, Peripheral Venous Disease, Type II Diabetes, Osteoarthritis, Osteomyelitis, Neuropathy Date Acquired: 12/02/2019 N/A N/A Weeks of Treatment: 30 N/A N/A Wound Status: Open N/A N/A Measurements L x W x D (cm) 1x0.4x0.2 N/A N/A Area (cm) : 0.314 N/A N/A Volume (cm) : 0.063 N/A N/A % Reduction in Area: 87.50% N/A N/A % Reduction in Volume: 97.20% N/A N/A Classification: Grade 2 N/A N/A Exudate Amount: Medium N/A N/A Exudate Type: Serosanguineous N/A N/A Exudate Color: red, brown N/A  N/A Wound Margin: Thickened N/A N/A Granulation Amount: Large (67-100%) N/A N/A Granulation Quality: Red, Pink N/A N/A Necrotic Amount: Small (1-33%) N/A N/A Exposed Structures: Fat Layer (Subcutaneous Tissue): N/A N/A Yes Fascia: No Tendon: No Muscle: No Joint: No Bone: No Epithelialization: None N/A N/A Treatment Notes Electronic Signature(s) Signed: 07/13/2020 4:53:32 PM By: Georges Mouse, Minus Breeding RN Entered By: Georges Mouse, Minus Breeding on 07/13/2020 15:37:23 Melinda [name] ([id number]) Melinda [name], Melinda [name] ([id number]) -------------------------------------------------------------------------------- Sabinal Details Patient Name: Melinda [name] Date of Service: 07/13/2020 3:00 PM Medical Record Number: [id number] Patient Account Number: [id number] Date of Birth/Sex: 1955/09/06 (64 y.o. F) Treating RN:  Dolan Amen Primary Care Wyeth Hoffer: [name] Other Clinician: Jeanine Luz Referring Kindred Reidinger: [name] Treating Gideon Burstein/Extender: Skipper Cliche in Treatment: 30 Active Inactive Necrotic Tissue Nursing Diagnoses: Impaired tissue integrity related to necrotic/devitalized tissue Goals: Necrotic/devitalized tissue will be minimized in the wound bed Date Initiated: 12/16/2019 Target Resolution Date: 12/26/2019 Goal Status: Active Interventions: Assess patient pain level pre-, during and post procedure and prior to discharge Treatment Activities: Excisional debridement : 12/16/2019 Notes: Pressure Nursing Diagnoses: Knowledge deficit related to causes and risk factors for pressure ulcer development Knowledge deficit related to management of pressures ulcers Goals: Patient will remain free from development of additional pressure ulcers Date Initiated: 12/16/2019 Target Resolution Date: 12/26/2019 Goal Status: Active Interventions: Provide education on pressure ulcers Notes: Wound/Skin Impairment Nursing Diagnoses: Impaired tissue  integrity Goals: Ulcer/skin breakdown will have a volume reduction of 30% by week 4 Date Initiated: 12/16/2019 Target Resolution Date: 01/16/2020 Goal Status: Active Interventions: Assess patient/caregiver ability to obtain necessary supplies Assess ulceration(s) every visit Treatment Activities: Skin care regimen initiated : 12/16/2019 Notes: Electronic Signature(s) Melinda [name], Melinda [name] ([id number]) Signed: 07/13/2020 4:53:32 PM By: Georges Mouse, Minus Breeding RN Entered By: Georges Mouse, Minus Breeding on 07/13/2020 15:37:14 Melinda [name] ([id number]) -------------------------------------------------------------------------------- Pain Assessment Details Patient Name: Melinda [name] Date of Service: 07/13/2020 3:00 PM Medical Record Number: [id number] Patient Account Number: [id number] Date of Birth/Sex: [birth year] (64 y.o. F) Treating RN: Carlene Coria Primary Care Avie Checo: [name] Other Clinician: Jeanine Luz Referring Ayo Guarino: [name] Treating Nikiya Starn/Extender: Skipper Cliche in Treatment: 30 Active Problems Location of Pain Severity and Description of Pain Patient Has Paino No Site Locations Pain Management and Medication Current Pain Management: Electronic Signature(s) Signed: 07/13/2020 4:27:39 PM By: Carlene Coria RN Entered By: Carlene Coria on 07/13/2020 15:14:50 Melinda [name] ([id number]) -------------------------------------------------------------------------------- Patient/Caregiver Education Details Patient Name: Melinda [name] Date of Service: 07/13/2020 3:00 PM Medical Record Number: [id number] Patient Account Number: [id number] Date of Birth/Gender: 03/08/1956 (64 y.o. F) Treating RN: Dolan Amen Primary Care Physician: [name] Other Clinician: Jeanine Luz Referring Physician: [name] Treating Physician/Extender: Skipper Cliche in Treatment: 30 Education Assessment Education Provided To: Patient Education Topics  Provided Wound/Skin Impairment: Methods: Explain/Verbal Responses: State content correctly Electronic Signature(s) Signed: 07/13/2020 4:53:32 PM By: Georges Mouse, Minus Breeding RN Entered By: Georges Mouse, Minus Breeding on 07/13/2020 15:44:27 Melinda [name] ([id number]) -------------------------------------------------------------------------------- Wound Assessment Details Patient Name: Melinda [name] Date of Service: 07/13/2020 3:00 PM Medical Record Number: [id number] Patient Account Number: [id number] Date of Birth/Sex: [birth year] (64 y.o. F) Treating RN: Carlene Coria Primary Care Shauni Henner: [name] Other Clinician: Jeanine Luz Referring Hershell Brandl: [name] Treating Geralene Afshar/Extender: Skipper Cliche in Treatment: 30 Wound Status Wound Number: 5 Primary Diabetic Wound/Ulcer of the Lower Extremity Etiology: Wound Location: Left, Plantar Toe Great Wound Open Wounding Event: Gradually Appeared Status: Date Acquired: 12/02/2019 Comorbid Cataracts, Coronary Artery Disease, Hypertension, Weeks Of Treatment: 30 History: Myocardial Infarction, Peripheral Venous Disease, Type II Clustered Wound: No Diabetes, Osteoarthritis, Osteomyelitis, Neuropathy Photos Wound Measurements Length: (cm) 1 Width: (cm) 0.4 Depth: (cm) 0.2 Area: (cm) 0.314 Volume: (cm) 0.063 % Reduction in Area: 87.5% % Reduction in Volume: 97.2% Epithelialization: None Tunneling: No Undermining: No Wound Description Classification: Grade 2 Wound Margin: Thickened Exudate Amount: Medium Exudate Type: Serosanguineous Exudate Color: red, brown Foul Odor After Cleansing: No Slough/Fibrino Yes Wound Bed Granulation Amount: Large (67-100%) Exposed Structure Granulation Quality: Red, Pink Fascia Exposed: No Necrotic Amount: Small (1-33%) Fat Layer (Subcutaneous Tissue) Exposed: Yes Necrotic Quality: Adherent Slough Tendon  Exposed: No Muscle Exposed: No Joint Exposed: No Bone Exposed: No Treatment  Notes Wound #5 (Toe Great) Wound Laterality: Plantar, Left Cleanser Normal Saline Discharge Instruction: Wash your hands with soap and water. Remove old dressing, discard into plastic bag and place into trash. Cleanse the wound with Normal Saline prior to applying a clean dressing using gauze sponges, not tissues or cotton balls. Do not Boerner, Floride E. ([id number]) scrub or use excessive force. Pat dry using gauze sponges, not tissue or cotton balls. Peri-Wound Care Topical Primary Dressing Prisma 4.34 (in) Discharge Instruction: Moisten w/normal saline or sterile water; Cover wound as directed. Do not remove from wound bed. Secondary Dressing Sylvester Roll-Medium Discharge Instruction: Apply Conforming Stretch Guaze Bandage as directed Gauze Discharge Instruction: As directed: dry, moistened with saline or moistened with Dakins Solution Secured With 51M Medipore H Soft Cloth Surgical Tape, 2x2 (in/yd) Compression Wrap Compression Stockings Add-Ons Electronic Signature(s) Signed: 07/13/2020 4:27:39 PM By: Carlene Coria RN Entered By: Carlene Coria on 07/13/2020 15:22:51 Melinda [name] ([id number]) -------------------------------------------------------------------------------- Vitals Details Patient Name: Melinda [name] Date of Service: 07/13/2020 3:00 PM Medical Record Number: [id number] Patient Account Number: [id number] Date of Birth/Sex: 1955-05-17 (64 y.o. F) Treating RN: Carlene Coria Primary Care Haniel Fix: [name] Other Clinician: Jeanine Luz Referring Urijah Arko: [name] Treating Leven Hoel/Extender: Skipper Cliche in Treatment: 30 Vital Signs Time Taken: 15:12 Temperature (F): 98.2 Height (in): 69 Pulse (bpm): 114 Respiratory Rate (breaths/min): 18 Blood Pressure (mmHg): 121/78 Reference Range: 80 - 120 mg / dl Electronic Signature(s) Signed: 07/13/2020 4:27:39 PM By: Carlene Coria RN Entered By: Carlene Coria on 07/13/2020 15:14:41

## 2020-07-20 ENCOUNTER — Ambulatory Visit: Payer: Medicare HMO | Admitting: Podiatry

## 2020-07-20 ENCOUNTER — Encounter: Payer: Self-pay | Admitting: Podiatry

## 2020-07-20 ENCOUNTER — Other Ambulatory Visit: Payer: Self-pay

## 2020-07-20 ENCOUNTER — Other Ambulatory Visit: Payer: Self-pay | Admitting: Cardiovascular Disease

## 2020-07-20 ENCOUNTER — Ambulatory Visit: Payer: Medicare HMO | Admitting: Physician Assistant

## 2020-07-20 DIAGNOSIS — Z794 Long term (current) use of insulin: Secondary | ICD-10-CM

## 2020-07-20 DIAGNOSIS — M79675 Pain in left toe(s): Secondary | ICD-10-CM

## 2020-07-20 DIAGNOSIS — B351 Tinea unguium: Secondary | ICD-10-CM

## 2020-07-20 DIAGNOSIS — L97521 Non-pressure chronic ulcer of other part of left foot limited to breakdown of skin: Secondary | ICD-10-CM | POA: Diagnosis not present

## 2020-07-20 DIAGNOSIS — M79674 Pain in right toe(s): Secondary | ICD-10-CM

## 2020-07-20 DIAGNOSIS — E1159 Type 2 diabetes mellitus with other circulatory complications: Secondary | ICD-10-CM | POA: Diagnosis not present

## 2020-07-20 NOTE — Progress Notes (Signed)
  Subjective:  Patient ID: Melinda [name], female    DOB: Mar 06, 1956,  MRN: [id number]  Chief Complaint  Patient presents with  . Nail Problem    Nail and callous trim, DFC   65 y.o. female returns for the above complaint.  Patient presents with thickened elongated dystrophic toenails x7.  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  Patient states is painful to touch she is not able to do so.  She is a diabetic with A1c of 8.0.  She denies any other acute complaints.  She has a left first metatarsophalangeal joint wound that is being managed by the wound care center primarily.  She denies any other acute complaints.  She would like the wound to be managed primarily by wound care center.  Objective:  There were no vitals filed for this visit. Podiatric Exam: Vascular: dorsalis pedis and posterior tibial pulses are palpable bilateral. Capillary return is immediate. Temperature gradient is WNL. Skin turgor WNL  Sensorium: Normal Semmes Weinstein monofilament test. Normal tactile sensation bilaterally. Nail Exam: Pt has thick disfigured discolored nails with subungual debris noted bilateral entire nail hallux through fifth toenails.  Pain on palpation to the nails. Ulcer Exam: There is no evidence of ulcer or pre-ulcerative changes or infection. Orthopedic Exam: Muscle tone and strength are WNL. No limitations in general ROM. No crepitus or effusions noted. HAV  B/L.  Hammer toes 2-5  B/L. Skin: No Porokeratosis. No infection.  Ulceration noted to left submetatarsal 1.  No clinical signs of infection noted.  Probing to superficial tissue    Assessment & Plan:   1. Type 2 diabetes mellitus with other circulatory complication, with long-term current use of insulin (HCC)   2. Pain due to onychomycosis of toenails of both feet   3. 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  Left first metatarsophalangeal joint wound -I explained the patient the etiology of wound and various treatment options were discussed.  The wound is being primarily managed by the wound care center.  I will let the wound care center primarily manage his wound.  If any issues or needs my assistant I discussed with the patient she can come see me.  She states understanding  Onychomycosis with pain  -Nails palliatively debrided as below. -Educated on self-care  Procedure: Nail Debridement Rationale: pain  Type of Debridement: manual, sharp debridement. Instrumentation: Nail nipper, rotary burr. Number of Nails: 7  Procedures and Treatment: Consent by patient was obtained for treatment procedures. The patient understood the discussion of treatment and procedures well. All questions were answered thoroughly reviewed. Debridement of mycotic and hypertrophic toenails, 1 through 5 bilateral and clearing of subungual debris. No ulceration, no infection noted.  Return Visit-Office Procedure: Patient instructed to return to the office for a follow up visit 3 months for continued evaluation and treatment.  Boneta Lucks, DPM    No follow-ups on file.

## 2020-07-22 ENCOUNTER — Other Ambulatory Visit: Payer: Self-pay

## 2020-07-22 ENCOUNTER — Other Ambulatory Visit: Admission: RE | Admit: 2020-07-22 | Payer: Medicare HMO | Source: Ambulatory Visit

## 2020-07-22 ENCOUNTER — Other Ambulatory Visit
Admission: RE | Admit: 2020-07-22 | Discharge: 2020-07-22 | Disposition: A | Discharge status: Home / Self Care | Payer: Medicare HMO | Source: Ambulatory Visit | Attending: Cardiology | Admitting: Cardiology

## 2020-07-22 ENCOUNTER — Encounter: Payer: Medicare HMO | Admitting: Physician Assistant

## 2020-07-22 DIAGNOSIS — I483 Typical atrial flutter: Secondary | ICD-10-CM | POA: Insufficient documentation

## 2020-07-22 DIAGNOSIS — E11621 Type 2 diabetes mellitus with foot ulcer: Secondary | ICD-10-CM | POA: Diagnosis not present

## 2020-07-22 LAB — BASIC METABOLIC PANEL
Anion gap: 9 (ref 5–15)
BUN: 14 mg/dL (ref 8–23)
CO2: 25 mmol/L (ref 22–32)
Calcium: 8.5 mg/dL — ABNORMAL LOW (ref 8.9–10.3)
Chloride: 105 mmol/L (ref 98–111)
Creatinine, Ser: 1.23 mg/dL — ABNORMAL HIGH (ref 0.44–1.00)
GFR, Estimated: 48 mL/min — ABNORMAL LOW (ref >60)
Glucose, Bld: 197 mg/dL — ABNORMAL HIGH (ref 70–99)
Potassium: 3.9 mmol/L (ref 3.5–5.1)
Sodium: 139 mmol/L (ref 135–145)

## 2020-07-22 LAB — CBC WITH DIFFERENTIAL/PLATELET
Abs Immature Granulocytes: 0.03 10*3/uL (ref 0.00–0.07)
Basophils Absolute: 0 10*3/uL (ref 0.0–0.1)
Basophils Relative: 1 %
Eosinophils Absolute: 0.1 10*3/uL (ref 0.0–0.5)
Eosinophils Relative: 1 %
HCT: 34.9 % — ABNORMAL LOW (ref 36.0–46.0)
Hemoglobin: 11.4 g/dL — ABNORMAL LOW (ref 12.0–15.0)
Immature Granulocytes: 1 %
Lymphocytes Relative: 19 %
Lymphs Abs: 1.1 10*3/uL (ref 0.7–4.0)
MCH: 24.7 pg — ABNORMAL LOW (ref 26.0–34.0)
MCHC: 32.7 g/dL (ref 30.0–36.0)
MCV: 75.5 fL — ABNORMAL LOW (ref 80.0–100.0)
Monocytes Absolute: 0.7 10*3/uL (ref 0.1–1.0)
Monocytes Relative: 12 %
Neutro Abs: 4.1 10*3/uL (ref 1.7–7.7)
Neutrophils Relative %: 66 %
Platelets: 244 10*3/uL (ref 150–400)
RBC: 4.62 MIL/uL (ref 3.87–5.11)
RDW: 22.5 % — ABNORMAL HIGH (ref 11.5–15.5)
Smear Review: NORMAL
WBC: 6 10*3/uL (ref 4.0–10.5)
nRBC: 0.3 % — ABNORMAL HIGH (ref 0.0–0.2)

## 2020-07-22 NOTE — Progress Notes (Addendum)
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 31 Diagnosis Coding ICD-10 Codes Code Description E11.621 Type 2 diabetes mellitus with foot ulcer 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sed I73.89 Other specified  peripheral vascular diseases E11.51 Type 2 diabetes mellitus with diabetic peripheral angiopathy without gangrene L84 Corns and callosities Facility Procedures CPT4 Code: 22297989 Description: 21194 - DEB SUBQ TISSUE 20 SQ CM/< Modifier: Quantity: 1 CPT4 Code: Description: ICD-10 Diagnosis Description L97.522 Non-pressure chronic ulcer of other part of left foot with fat layer exp Modifier: osed Quantity: Physician Procedures CPT4 Code: 1740814 Description: 11042 - WC PHYS SUBQ TISS 20 SQ CM Modifier: Quantity: 1 CPT4 Code: Description: ICD-10 Diagnosis Description L97.522 Non-pressure chronic ulcer of other part of left foot with fat layer exp Modifier: osed Quantity: Electronic Signature(s) Signed: 07/22/2020 5:49:46 PM By: [name] [name] Previous Signature: 07/22/2020 11:59:48 AM Version By: Georges Mouse, Minus Breeding RN Entered By: [name] on 07/22/2020 17:49:46

## 2020-07-23 NOTE — Progress Notes (Signed)
[name], [name] ([id number]) Visit Report for 07/22/2020 Arrival Information Details Patient Name: Melinda [name], Melinda [name] Date of Service: 07/22/2020 10:00 AM Medical Record Number: [id number] Patient Account Number: [id number] Date of Birth/Sex: 1955-06-25 (64 y.o. F) Treating RN: Dolan Amen Primary Care Temiloluwa Recchia: [name] Other Clinician: Jeanine Luz Referring Cherika Jessie: [name] Treating Colbi Schiltz/Extender: Skipper Cliche in Treatment: 31 Visit Information History Since Last Visit Added or deleted any medications: No Patient Arrived: Ambulatory Had a fall or experienced change in No Arrival Time: 09:51 activities of daily living that may affect Accompanied By: self risk of falls: Transfer Assistance: None Hospitalized since last visit: No Patient Identification Verified: Yes Pain Present Now: No Secondary Verification Process Completed: Yes Patient Requires Transmission-Based No Precautions: Patient Has Alerts: Yes Patient Alerts: 10/28/19 ABI: R 1.05 L .60 Electronic Signature(s) Signed: 07/23/2020 8:44:23 AM By: Jeanine Luz Entered By: Jeanine Luz on 07/22/2020 09:51:24 Melinda [name] ([id number]) -------------------------------------------------------------------------------- Clinic Level of Care Assessment Details Patient Name: Melinda [name] Date of Service: 07/22/2020 10:00 AM Medical Record Number: [id number] Patient Account Number: [id number] Date of Birth/Sex: [birth year] (64 y.o. F) Treating RN: Dolan Amen Primary Care Keiaira Donlan: [name] Other Clinician: Jeanine Luz Referring Shaima Sardinas: [name] Treating Prabhav Faulkenberry/Extender: Skipper Cliche in Treatment: 31 Clinic Level of Care Assessment Items TOOL 1 Quantity Score []  - Use when EandM and Procedure is performed on INITIAL visit 0 ASSESSMENTS - Nursing Assessment / Reassessment []  - General Physical Exam (combine w/ comprehensive assessment (listed just below) when performed  on new 0 pt. evals) []  - 0 Comprehensive Assessment (HX, ROS, Risk Assessments, Wounds Hx, etc.) ASSESSMENTS - Wound and Skin Assessment / Reassessment []  - Dermatologic / Skin Assessment (not related to wound area) 0 ASSESSMENTS - Ostomy and/or Continence Assessment and Care []  - Incontinence Assessment and Management 0 []  - 0 Ostomy Care Assessment and Management (repouching, etc.) PROCESS - Coordination of Care []  - Simple Patient / Family Education for ongoing care 0 []  - 0 Complex (extensive) Patient / Family Education for ongoing care []  - 0 Staff obtains Programmer, systems, Records, Test Results / Process Orders []  - 0 Staff telephones HHA, Nursing Homes / Clarify orders / etc []  - 0 Routine Transfer to another Facility (non-emergent condition) []  - 0 Routine Hospital Admission (non-emergent condition) []  - 0 New Admissions / Biomedical engineer / Ordering NPWT, Apligraf, etc. []  - 0 Emergency Hospital Admission (emergent condition) PROCESS - Special Needs []  - Pediatric / Minor Patient Management 0 []  - 0 Isolation Patient Management []  - 0 Hearing / Language / Visual special needs []  - 0 Assessment of Community assistance (transportation, D/C planning, etc.) []  - 0 Additional assistance / Altered mentation []  - 0 Support Surface(s) Assessment (bed, cushion, seat, etc.) INTERVENTIONS - Miscellaneous []  - External ear exam 0 []  - 0 Patient Transfer (multiple staff / Civil Service fast streamer / Similar devices) []  - 0 Simple Staple / Suture removal (25 or less) []  - 0 Complex Staple / Suture removal (26 or more) []  - 0 Hypo/Hyperglycemic Management (do not check if billed separately) []  - 0 Ankle / Brachial Index (ABI) - do not check if billed separately Has the patient been seen at the hospital within the last three years: Yes Total Score: 0 Level Of Care: ____ Melinda [name] ([id number]) Electronic Signature(s) Signed: 07/22/2020 11:59:48 AM By: Georges Mouse, Minus Breeding  RN Entered By: Georges Mouse, Minus Breeding on 07/22/2020 10:16:57 Melinda [name] ([id number]) -------------------------------------------------------------------------------- Encounter Discharge Information Details Patient Name: Melinda [name]  E. Date of Service: 07/22/2020 10:00 AM Medical Record Number: [id number] Patient Account Number: [id number] Date of Birth/Sex: 11/27/1955 (64 y.o. F) Treating RN: Dolan Amen Primary Care Jorene Kaylor: [name] Other Clinician: Jeanine Luz Referring Loletta Harper: [name] Treating Christianne Zacher/Extender: Skipper Cliche in Treatment: 31 Encounter Discharge Information Items Post Procedure Vitals Discharge Condition: Stable Temperature (F): 98.5 Ambulatory Status: Cane Pulse (bpm): 108 Discharge Destination: Home Respiratory Rate (breaths/min): 22 Transportation: Private Auto Blood Pressure (mmHg): 109/57 Accompanied By: self Schedule Follow-up Appointment: Yes Clinical Summary of Care: Electronic Signature(s) Signed: 07/23/2020 8:44:23 AM By: Jeanine Luz Entered By: Jeanine Luz on 07/22/2020 10:31:58 Melinda [name] ([id number]) -------------------------------------------------------------------------------- Lower Extremity Assessment Details Patient Name: Melinda [name] Date of Service: 07/22/2020 10:00 AM Medical Record Number: [id number] Patient Account Number: [id number] Date of Birth/Sex: Mar 25, 1956 (64 y.o. F) Treating RN: Dolan Amen Primary Care Josel Keo: [name] Other Clinician: Jeanine Luz Referring Rubena Roseman: [name] Treating Jarielys Girardot/Extender: Skipper Cliche in Treatment: 31 Edema Assessment Assessed: [Left: No] [Right: No] [Left: Edema] [Right: :] Calf Left: Right: Point of Measurement: 38 cm From Medial Instep 41 cm Ankle Left: Right: Point of Measurement: 12 cm From Medial Instep 26.2 cm Vascular Assessment Pulses: Dorsalis Pedis Palpable: [Left:Yes] Electronic Signature(s) Signed:  07/22/2020 11:59:48 AM By: Georges Mouse, Minus Breeding RN Signed: 07/23/2020 8:44:23 AM By: Jeanine Luz Entered By: Jeanine Luz on 07/22/2020 09:56:47 Melinda [name] ([id number]) -------------------------------------------------------------------------------- Multi Wound Chart Details Patient Name: Melinda [name] Date of Service: 07/22/2020 10:00 AM Medical Record Number: [id number] Patient Account Number: [id number] Date of Birth/Sex: Aug 03, 1955 (64 y.o. F) Treating RN: Dolan Amen Primary Care Trequan Marsolek: [name] Other Clinician: Jeanine Luz Referring Janelie Goltz: [name] Treating Klarissa Mcilvain/Extender: Skipper Cliche in Treatment: 31 Vital Signs Height(in): 69 Pulse(bpm): 108 Weight(lbs): Blood Pressure(mmHg): 109/57 Body Mass Index(BMI): Temperature(F): 98.5 Respiratory Rate(breaths/min): 22 Photos: [N/A:N/A] Wound Location: Left, Plantar Toe Great N/A N/A Wounding Event: Gradually Appeared N/A N/A Primary Etiology: Diabetic Wound/Ulcer of the Lower N/A N/A Extremity Comorbid History: Cataracts, Coronary Artery Disease, N/A N/A Hypertension, Myocardial Infarction, Peripheral Venous Disease, Type II Diabetes, Osteoarthritis, Osteomyelitis, Neuropathy Date Acquired: 12/02/2019 N/A N/A Weeks of Treatment: 31 N/A N/A Wound Status: Open N/A N/A Measurements L x W x D (cm) 0.4x0.3x0.2 N/A N/A Area (cm) : 0.094 N/A N/A Volume (cm) : 0.019 N/A N/A % Reduction in Area: 96.30% N/A N/A % Reduction in Volume: 99.20% N/A N/A Classification: Grade 2 N/A N/A Exudate Amount: Small N/A N/A Exudate Type: Serosanguineous N/A N/A Exudate Color: red, brown N/A N/A Wound Margin: Thickened N/A N/A Granulation Amount: Large (67-100%) N/A N/A Granulation Quality: Red, Pink N/A N/A Necrotic Amount: Small (1-33%) N/A N/A Exposed Structures: Fat Layer (Subcutaneous Tissue): N/A N/A Yes Fascia: No Tendon: No Muscle: No Joint: No Bone: No Epithelialization: None N/A  N/A Treatment Notes Electronic Signature(s) Signed: 07/22/2020 11:59:48 AM By: Georges Mouse, Minus Breeding RN Entered By: Georges Mouse, Minus Breeding on 07/22/2020 10:09:44 Melinda [name] ([id number]) Melinda [name], Melinda [name] ([id number]) -------------------------------------------------------------------------------- [employer] Details Patient Name: Melinda [name] Date of Service: 07/22/2020 10:00 AM Medical Record Number: [id number] Patient Account Number: [id number] Date of Birth/Sex: [birth year] (64 y.o. F) Treating RN: Dolan Amen Primary Care Ho Parisi: [name] Other Clinician: Jeanine Luz Referring Gaege Sangalang: [name] Treating Theresa Wedel/Extender: Skipper Cliche in Treatment: 31 Active Inactive Necrotic Tissue Nursing Diagnoses: Impaired tissue integrity related to necrotic/devitalized tissue Goals: Necrotic/devitalized tissue will be minimized in the wound bed Date Initiated: 12/16/2019 Target Resolution Date: 12/26/2019 Goal Status: Active Interventions:  Assess patient pain level pre-, during and post procedure and prior to discharge Treatment Activities: Excisional debridement : 12/16/2019 Notes: Pressure Nursing Diagnoses: Knowledge deficit related to causes and risk factors for pressure ulcer development Knowledge deficit related to management of pressures ulcers Goals: Patient will remain free from development of additional pressure ulcers Date Initiated: 12/16/2019 Target Resolution Date: 12/26/2019 Goal Status: Active Interventions: Provide education on pressure ulcers Notes: Wound/Skin Impairment Nursing Diagnoses: Impaired tissue integrity Goals: Ulcer/skin breakdown will have a volume reduction of 30% by week 4 Date Initiated: 12/16/2019 Target Resolution Date: 01/16/2020 Goal Status: Active Interventions: Assess patient/caregiver ability to obtain necessary supplies Assess ulceration(s) every visit Treatment Activities: Skin care  regimen initiated : 12/16/2019 Notes: Electronic Signature(s) Melinda [name], Melinda [name] ([id number]) Signed: 07/22/2020 11:59:48 AM By: Georges Mouse, Minus Breeding RN Entered By: Georges Mouse, Minus Breeding on 07/22/2020 10:09:08 Melinda [name] ([id number]) -------------------------------------------------------------------------------- Pain Assessment Details Patient Name: Melinda [name] Date of Service: 07/22/2020 10:00 AM Medical Record Number: [id number] Patient Account Number: [id number] Date of Birth/Sex: Apr 18, 1956 (64 y.o. F) Treating RN: Dolan Amen Primary Care Ercelle Winkles: [name] Other Clinician: Jeanine Luz Referring Wojciech Willetts: [name] Treating Veronique Warga/Extender: Skipper Cliche in Treatment: 31 Active Problems Location of Pain Severity and Description of Pain Patient Has Paino No Site Locations Pain Management and Medication Current Pain Management: Electronic Signature(s) Signed: 07/22/2020 11:59:48 AM By: Georges Mouse, Minus Breeding RN Signed: 07/23/2020 8:44:23 AM By: Jeanine Luz Entered By: Jeanine Luz on 07/22/2020 09:51:58 Melinda [name] ([id number]) -------------------------------------------------------------------------------- Patient/Caregiver Education Details Patient Name: Melinda [name] Date of Service: 07/22/2020 10:00 AM Medical Record Number: [id number] Patient Account Number: [id number] Date of Birth/Gender: 07-11-1955 (64 y.o. F) Treating RN: Dolan Amen Primary Care Physician: [name] Other Clinician: Jeanine Luz Referring Physician: [name] Treating Physician/Extender: Skipper Cliche in Treatment: 72 Education Assessment Education Provided To: Patient Education Topics Provided Wound/Skin Impairment: Methods: Explain/Verbal Responses: State content correctly Notes TCC education Electronic Signature(s) Signed: 07/22/2020 11:59:48 AM By: Georges Mouse, Minus Breeding RN Entered By: Georges Mouse, Minus Breeding on 07/22/2020  10:17:18 Melinda [name] ([id number]) -------------------------------------------------------------------------------- Wound Assessment Details Patient Name: Melinda [name] Date of Service: 07/22/2020 10:00 AM Medical Record Number: [id number] Patient Account Number: [id number] Date of Birth/Sex: [birth year] (64 y.o. F) Treating RN: Dolan Amen Primary Care Ressie Slevin: [name] Other Clinician: Jeanine Luz Referring Mattilynn Forrer: [name] Treating [place] Doig/Extender: Skipper Cliche in Treatment: 31 Wound Status Wound Number: 5 Primary Diabetic Wound/Ulcer of the Lower Extremity Etiology: Wound Location: Left, Plantar Toe Great Wound Open Wounding Event: Gradually Appeared Status: Date Acquired: 12/02/2019 Comorbid Cataracts, Coronary Artery Disease, Hypertension, Weeks Of Treatment: 31 History: Myocardial Infarction, Peripheral Venous Disease, Type II Clustered Wound: No Diabetes, Osteoarthritis, Osteomyelitis, Neuropathy Photos Wound Measurements Length: (cm) 0.4 Width: (cm) 0.3 Depth: (cm) 0.2 Area: (cm) 0.094 Volume: (cm) 0.019 % Reduction in Area: 96.3% % Reduction in Volume: 99.2% Epithelialization: None Tunneling: No Undermining: No Wound Description Classification: Grade 2 Wound Margin: Thickened Exudate Amount: Small Exudate Type: Serosanguineous Exudate Color: red, brown Foul Odor After Cleansing: No Slough/Fibrino Yes Wound Bed Granulation Amount: Large (67-100%) Exposed Structure Granulation Quality: Red, Pink Fascia Exposed: No Necrotic Amount: Small (1-33%) Fat Layer (Subcutaneous Tissue) Exposed: Yes Necrotic Quality: Adherent Slough Tendon Exposed: No Muscle Exposed: No Joint Exposed: No Bone Exposed: No Treatment Notes Wound #5 (Toe Great) Wound Laterality: Plantar, Left Cleanser Normal Saline Discharge Instruction: Wash your hands with soap and water. Remove old dressing, discard into plastic bag and place into trash. Cleanse  the wound with Normal Saline prior to applying a clean dressing using gauze sponges, not tissues or cotton balls. Do not Melinda [name], Melinda E. ([id number]) scrub or use excessive force. Pat dry using gauze sponges, not tissue or cotton balls. Peri-Wound Care Topical Primary Dressing Prisma 4.34 (in) Quantity: 1 Discharge Instruction: Moisten w/normal saline or sterile water; Cover wound as directed. Do not remove from wound bed. Secondary Dressing Conforming Guaze Roll-Medium Quantity: 1 Discharge Instruction: Secure dressing Gauze Quantity: 1 Discharge Instruction: Apply dry as padding Secured With 42M Medipore H Soft Cloth Surgical Tape, 2x2 (in/yd) Compression Wrap Compression Stockings Add-Ons Electronic Signature(s) Signed: 07/22/2020 11:59:48 AM By: Georges Mouse, Minus Breeding RN Signed: 07/23/2020 8:44:23 AM By: Jeanine Luz Entered By: Jeanine Luz on 07/22/2020 09:54:51 Melinda [name] ([id number]) -------------------------------------------------------------------------------- Vitals Details Patient Name: Melinda [name] Date of Service: 07/22/2020 10:00 AM Medical Record Number: [id number] Patient Account Number: [id number] Date of Birth/Sex: [birth year] (64 y.o. F) Treating RN: Dolan Amen Primary Care Conway Fedora: [name] Other Clinician: Jeanine Luz Referring Kariah Loredo: [name] Treating Davis Ambrosini/Extender: Skipper Cliche in Treatment: 31 Vital Signs Time Taken: 09:45 Temperature (F): 98.5 Height (in): 69 Pulse (bpm): 108 Respiratory Rate (breaths/min): 22 Blood Pressure (mmHg): 109/57 Reference Range: 80 - 120 mg / dl Electronic Signature(s) Signed: 07/23/2020 8:44:23 AM By: Jeanine Luz Entered By: Jeanine Luz on 07/22/2020 09:51:49

## 2020-07-27 ENCOUNTER — Encounter: Payer: Medicare HMO | Admitting: Physician Assistant

## 2020-07-27 ENCOUNTER — Other Ambulatory Visit: Payer: Self-pay

## 2020-07-27 DIAGNOSIS — E11621 Type 2 diabetes mellitus with foot ulcer: Secondary | ICD-10-CM | POA: Diagnosis not present

## 2020-07-27 NOTE — Progress Notes (Addendum)
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 13:31:07 Melinda [name] ([id number]) -------------------------------------------------------------------------------- Total Contact Cast Details Patient Name: Melinda [name] Date of Service: 07/27/2020  12:30 PM Medical Record Number: [id number] Patient Account Number: [id number] Date of Birth/Sex: 03/13/1956 (64 y.o. F) Treating RN: Dolan Amen Primary Care Provider: [name] Other Clinician: Jeanine Luz Referring Provider: [name] Treating Provider/Extender: Skipper Cliche in Treatment: 32 Total Contact Cast Applied for Wound Assessment: Wound #5 Left,Plantar Toe Great Performed By: Physician Tommie Sams., [name] Post Procedure Diagnosis Same as Pre-procedure Electronic Signature(s) Signed: 07/27/2020 2:16:03 PM By: [name] [name] Signed: 07/27/2020 5:07:41 PM By: Georges Mouse, Minus Breeding RN Entered By: Georges Mouse, Minus Breeding on 07/27/2020 13:26:44 Melinda [name] ([id number]) -------------------------------------------------------------------------------- SuperBill Details Patient Name: Melinda [name] Date of Service: 07/27/2020 Medical Record Number: [id number] Patient Account Number: [id number] Date of Birth/Sex: 02-10-1956 (64 y.o. F) Treating RN: Dolan Amen Primary Care Provider: [name] Other Clinician: Jeanine Luz Referring Provider: [name] Treating Provider/Extender: Skipper Cliche in Treatment: 32 Diagnosis Coding ICD-10 Codes Code Description E11.621 Type 2 diabetes mellitus with foot ulcer 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sed I73.89 Other specified peripheral vascular diseases E11.51 Type 2 diabetes mellitus with diabetic peripheral angiopathy without gangrene L84 Corns and callosities Facility Procedures CPT4 Code: 71245809 Description: 98338 - DEB SUBQ TISSUE 20 SQ CM/< Modifier: Quantity: 1 CPT4 Code: Description: ICD-10 Diagnosis Description L97.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

## 2020-07-29 ENCOUNTER — Encounter: Payer: Medicare HMO | Admitting: Physician Assistant

## 2020-07-29 ENCOUNTER — Other Ambulatory Visit: Payer: Self-pay

## 2020-07-29 DIAGNOSIS — E11621 Type 2 diabetes mellitus with foot ulcer: Secondary | ICD-10-CM | POA: Diagnosis not present

## 2020-07-29 NOTE — Progress Notes (Signed)
TITA, TERHAAR ([id number]) Visit Report for 07/29/2020 Arrival Information Details Patient Name: Melinda [name], Melinda [name] Date of Service: 07/29/2020 11:00 AM Medical Record Number: [id number] Patient Account Number: [id number] Date of Birth/Sex: August 10, 1955 (64 y.o. F) Treating RN: Dolan Amen Primary Care Erienne Spelman: [name] Other Clinician: Jeanine Luz Referring Eilis Chestnutt: [name] Treating Teion Ballin/Extender: Skipper Cliche in Treatment: 8 Visit Information History Since Last Visit Added or deleted any medications: No Patient Arrived: Wheel Chair Had a fall or experienced change in No Arrival Time: 11:31 activities of daily living that may affect Accompanied By: self risk of falls: Transfer Assistance: None Hospitalized since last visit: No Patient Identification Verified: Yes Pain Present Now: No Secondary Verification Process Completed: Yes Patient Requires Transmission-Based No Precautions: Patient Has Alerts: Yes Patient Alerts: 10/28/19 ABI: R 1.05 L .60 Electronic Signature(s) Signed: 07/29/2020 4:38:23 PM By: Jeanine Luz Entered By: Jeanine Luz on 07/29/2020 11:31:58 Melinda [name] ([id number]) -------------------------------------------------------------------------------- Clinic Level of Care Assessment Details Patient Name: Melinda [name] Date of Service: 07/29/2020 11:00 AM Medical Record Number: [id number] Patient Account Number: [id number] Date of Birth/Sex: [birth year] (64 y.o. F) Treating RN: Dolan Amen Primary Care Thijs Brunton: [name] Other Clinician: Jeanine Luz Referring Latesa Fratto: [name] Treating Athenia Rys/Extender: Skipper Cliche in Treatment: 32 Clinic Level of Care Assessment Items TOOL 4 Quantity Score X - Use when only an EandM is performed on FOLLOW-UP visit 1 0 ASSESSMENTS - Nursing Assessment / Reassessment X - Reassessment of Co-morbidities (includes updates in patient status) 1 10 X- 1 5 Reassessment of  Adherence to Treatment Plan ASSESSMENTS - Wound and Skin Assessment / Reassessment X - Simple Wound Assessment / Reassessment - one wound 1 5 []  - 0 Complex Wound Assessment / Reassessment - multiple wounds []  - 0 Dermatologic / Skin Assessment (not related to wound area) ASSESSMENTS - Focused Assessment []  - Circumferential Edema Measurements - multi extremities 0 []  - 0 Nutritional Assessment / Counseling / Intervention []  - 0 Lower Extremity Assessment (monofilament, tuning fork, pulses) []  - 0 Peripheral Arterial Disease Assessment (using hand held doppler) ASSESSMENTS - Ostomy and/or Continence Assessment and Care []  - Incontinence Assessment and Management 0 []  - 0 Ostomy Care Assessment and Management (repouching, etc.) PROCESS - Coordination of Care X - Simple Patient / Family Education for ongoing care 1 15 []  - 0 Complex (extensive) Patient / Family Education for ongoing care []  - 0 Staff obtains Programmer, systems, Records, Test Results / Process Orders []  - 0 Staff telephones HHA, Nursing Homes / Clarify orders / etc []  - 0 Routine Transfer to another Facility (non-emergent condition) []  - 0 Routine Hospital Admission (non-emergent condition) []  - 0 New Admissions / Biomedical engineer / Ordering NPWT, Apligraf, etc. []  - 0 Emergency Hospital Admission (emergent condition) X- 1 10 Simple Discharge Coordination []  - 0 Complex (extensive) Discharge Coordination PROCESS - Special Needs []  - Pediatric / Minor Patient Management 0 []  - 0 Isolation Patient Management []  - 0 Hearing / Language / Visual special needs []  - 0 Assessment of Community assistance (transportation, D/C planning, etc.) []  - 0 Additional assistance / Altered mentation []  - 0 Support Surface(s) Assessment (bed, cushion, seat, etc.) INTERVENTIONS - Wound Cleansing / Measurement Wallner, Shauntell E. ([id number]) X- 1 5 Simple Wound Cleansing - one wound []  - 0 Complex Wound Cleansing - multiple  wounds X- 1 5 Wound Imaging (photographs - any number of wounds) []  - 0 Wound Tracing (instead of photographs) X- 1 5 Simple Wound Measurement - one  wound []  - 0 Complex Wound Measurement - multiple wounds INTERVENTIONS - Wound Dressings []  - Small Wound Dressing one or multiple wounds 0 X- 1 15 Medium Wound Dressing one or multiple wounds []  - 0 Large Wound Dressing one or multiple wounds []  - 0 Application of Medications - topical []  - 0 Application of Medications - injection INTERVENTIONS - Miscellaneous []  - External ear exam 0 []  - 0 Specimen Collection (cultures, biopsies, blood, body fluids, etc.) []  - 0 Specimen(s) / Culture(s) sent or taken to Lab for analysis []  - 0 Patient Transfer (multiple staff / Harrel Lemon Lift / Similar devices) []  - 0 Simple Staple / Suture removal (25 or less) []  - 0 Complex Staple / Suture removal (26 or more) []  - 0 Hypo / Hyperglycemic Management (close monitor of Blood Glucose) []  - 0 Ankle / Brachial Index (ABI) - do not check if billed separately X- 1 5 Vital Signs Has the patient been seen at the hospital within the last three years: Yes Total Score: 80 Level Of Care: New/Established - Level 3 Electronic Signature(s) Signed: 07/29/2020 5:09:32 PM By: Georges Mouse, Minus Breeding RN Entered By: Georges Mouse, Minus Breeding on 07/29/2020 12:00:14 Melinda [name] ([id number]) -------------------------------------------------------------------------------- Lower Extremity Assessment Details Patient Name: Melinda [name] Date of Service: 07/29/2020 11:00 AM Medical Record Number: [id number] Patient Account Number: [id number] Date of Birth/Sex: [birth year] (64 y.o. F) Treating RN: Dolan Amen Primary Care Tanaysha Alkins: [name] Other Clinician: Jeanine Luz Referring Shateria Paternostro: [name] Treating Ravleen Ries/Extender: Skipper Cliche in Treatment: 32 Edema Assessment Assessed: [Left: Yes] [Right: No] Edema: [Left: Ye] [Right:  s] Calf Left: Right: Point of Measurement: 38 cm From Medial Instep 40 cm Ankle Left: Right: Point of Measurement: 12 cm From Medial Instep 25.5 cm Vascular Assessment Pulses: Dorsalis Pedis Palpable: [Left:Yes] Electronic Signature(s) Signed: 07/29/2020 4:38:23 PM By: Jeanine Luz Signed: 07/29/2020 5:09:32 PM By: Georges Mouse, Minus Breeding RN Entered By: Jeanine Luz on 07/29/2020 11:51:04 Melinda [name] ([id number]) -------------------------------------------------------------------------------- Multi Wound Chart Details Patient Name: Melinda [name] Date of Service: 07/29/2020 11:00 AM Medical Record Number: [id number] Patient Account Number: [id number] Date of Birth/Sex: [birth year] (64 y.o. F) Treating RN: Dolan Amen Primary Care Daphanie Oquendo: [name] Other Clinician: Jeanine Luz Referring Tracie Lindbloom: [name] Treating Gavon Majano/Extender: Skipper Cliche in Treatment: 32 Vital Signs Height(in): 69 Pulse(bpm): 108 Weight(lbs): Blood Pressure(mmHg): 130/72 Body Mass Index(BMI): Temperature(F): 98.3 Respiratory Rate(breaths/min): 18 Photos: [N/A:N/A] Wound Location: Left, Plantar Toe Great N/A N/A Wounding Event: Gradually Appeared N/A N/A Primary Etiology: Diabetic Wound/Ulcer of the Lower N/A N/A Extremity Comorbid History: Cataracts, Coronary Artery Disease, N/A N/A Hypertension, Myocardial Infarction, Peripheral Venous Disease, Type II Diabetes, Osteoarthritis, Osteomyelitis, Neuropathy Date Acquired: 12/02/2019 N/A N/A Weeks of Treatment: 32 N/A N/A Wound Status: Open N/A N/A Measurements L x W x D (cm) 0.5x0.2x0.2 N/A N/A Area (cm) : 0.079 N/A N/A Volume (cm) : 0.016 N/A N/A % Reduction in Area: 96.90% N/A N/A % Reduction in Volume: 99.30% N/A N/A Classification: Grade 2 N/A N/A Exudate Amount: Small N/A N/A Exudate Type: Serosanguineous N/A N/A Exudate Color: red, brown N/A N/A Wound Margin: Thickened N/A N/A Granulation Amount: Large  (67-100%) N/A N/A Granulation Quality: Red, Pink N/A N/A Necrotic Amount: None Present (0%) N/A N/A Exposed Structures: Fat Layer (Subcutaneous Tissue): N/A N/A Yes Fascia: No Tendon: No Muscle: No Joint: No Bone: No Epithelialization: None N/A N/A Treatment Notes Electronic Signature(s) Signed: 07/29/2020 5:09:32 PM By: Georges Mouse, Minus Breeding RN Entered By: Georges Mouse, Minus Breeding on 07/29/2020 11:57:52  Melinda [name], Melinda [name] ([id number] Poke, Sonia Side ([id number]) -------------------------------------------------------------------------------- Senath Details Patient Name: Melinda [name], Melinda [name] Date of Service: 07/29/2020 11:00 AM Medical Record Number: [id number] Patient Account Number: [id number] Date of Birth/Sex: July 02, 1955 (64 y.o. F) Treating RN: Dolan Amen Primary Care Zoelle Markus: [name] Other Clinician: Jeanine Luz Referring Muhammed Teutsch: [name] Treating Kashis Penley/Extender: Skipper Cliche in Treatment: 32 Active Inactive Necrotic Tissue Nursing Diagnoses: Impaired tissue integrity related to necrotic/devitalized tissue Goals: Necrotic/devitalized tissue will be minimized in the wound bed Date Initiated: 12/16/2019 Target Resolution Date: 12/26/2019 Goal Status: Active Interventions: Assess patient pain level pre-, during and post procedure and prior to discharge Treatment Activities: Excisional debridement : 12/16/2019 Notes: Pressure Nursing Diagnoses: Knowledge deficit related to causes and risk factors for pressure ulcer development Knowledge deficit related to management of pressures ulcers Goals: Patient will remain free from development of additional pressure ulcers Date Initiated: 12/16/2019 Target Resolution Date: 12/26/2019 Goal Status: Active Interventions: Provide education on pressure ulcers Notes: Wound/Skin Impairment Nursing Diagnoses: Impaired tissue integrity Goals: Ulcer/skin breakdown will have a volume  reduction of 30% by week 4 Date Initiated: 12/16/2019 Target Resolution Date: 01/16/2020 Goal Status: Active Interventions: Assess patient/caregiver ability to obtain necessary supplies Assess ulceration(s) every visit Treatment Activities: Skin care regimen initiated : 12/16/2019 Notes: Electronic Signature(s) Melinda [name], Melinda [name] ([id number]) Signed: 07/29/2020 5:09:32 PM By: Georges Mouse, Minus Breeding RN Entered By: Georges Mouse, Minus Breeding on 07/29/2020 11:57:14 Melinda [name] ([id number]) -------------------------------------------------------------------------------- Pain Assessment Details Patient Name: Melinda [name] Date of Service: 07/29/2020 11:00 AM Medical Record Number: [id number] Patient Account Number: [id number] Date of Birth/Sex: 23-Jun-1955 (64 y.o. F) Treating RN: Dolan Amen Primary Care Delorean Knutzen: [name] Other Clinician: Jeanine Luz Referring Ramesha Poster: [name] Treating Rayce Brahmbhatt/Extender: Skipper Cliche in Treatment: 32 Active Problems Location of Pain Severity and Description of Pain Patient Has Paino No Site Locations Rate the pain. Current Pain Level: 0 Pain Management and Medication Current Pain Management: Electronic Signature(s) Signed: 07/29/2020 4:38:23 PM By: Jeanine Luz Signed: 07/29/2020 5:09:32 PM By: Georges Mouse, Minus Breeding RN Entered By: Jeanine Luz on 07/29/2020 11:32:22 Melinda [name] ([id number]) -------------------------------------------------------------------------------- Patient/Caregiver Education Details Patient Name: Melinda [name] Date of Service: 07/29/2020 11:00 AM Medical Record Number: [id number] Patient Account Number: [id number] Date of Birth/Gender: [birth year] (64 y.o. F) Treating RN: Dolan Amen Primary Care Physician: [name] Other Clinician: Jeanine Luz Referring Physician: [name] Treating Physician/Extender: Skipper Cliche in Treatment: 68 Education Assessment Education  Provided To: Patient Education Topics Provided Wound/Skin Impairment: Methods: Explain/Verbal Responses: State content correctly Electronic Signature(s) Signed: 07/29/2020 5:09:32 PM By: Georges Mouse, Minus Breeding RN Entered By: Georges Mouse, Minus Breeding on 07/29/2020 12:00:28 Melinda [name] ([id number]) -------------------------------------------------------------------------------- Wound Assessment Details Patient Name: Melinda [name] Date of Service: 07/29/2020 11:00 AM Medical Record Number: [id number] Patient Account Number: [id number] Date of Birth/Sex: 1955/07/09 (64 y.o. F) Treating RN: Dolan Amen Primary Care Jabari Swoveland: [name] Other Clinician: Jeanine Luz Referring Livi Mcgann: [name] Treating Eldena Dede/Extender: Skipper Cliche in Treatment: 32 Wound Status Wound Number: 5 Primary Diabetic Wound/Ulcer of the Lower Extremity Etiology: Wound Location: Left, Plantar Toe Great Wound Open Wounding Event: Gradually Appeared Status: Date Acquired: 12/02/2019 Comorbid Cataracts, Coronary Artery Disease, Hypertension, Weeks Of Treatment: 32 History: Myocardial Infarction, Peripheral Venous Disease, Type II Clustered Wound: No Diabetes, Osteoarthritis, Osteomyelitis, Neuropathy Photos Wound Measurements Length: (cm) 0.5 Width: (cm) 0.2 Depth: (cm) 0.2 Area: (cm) 0.079 Volume: (cm) 0.016 % Reduction in Area: 96.9% % Reduction in Volume: 99.3% Epithelialization: None Tunneling: No Undermining:  No Wound Description Classification: Grade 2 Wound Margin: Thickened Exudate Amount: Small Exudate Type: Serosanguineous Exudate Color: red, brown Foul Odor After Cleansing: No Slough/Fibrino No Wound Bed Granulation Amount: Large (67-100%) Exposed Structure Granulation Quality: Red, Pink Fascia Exposed: No Necrotic Amount: None Present (0%) Fat Layer (Subcutaneous Tissue) Exposed: Yes Tendon Exposed: No Muscle Exposed: No Joint Exposed: No Bone Exposed:  No Electronic Signature(s) Signed: 07/29/2020 4:38:23 PM By: Jeanine Luz Signed: 07/29/2020 5:09:32 PM By: Georges Mouse, Minus Breeding RN Entered By: Jeanine Luz on 07/29/2020 Melinda [name], Melinda [name] ([id number]) -------------------------------------------------------------------------------- Vitals Details Patient Name: Melinda [name] Date of Service: 07/29/2020 11:00 AM Medical Record Number: [id number] Patient Account Number: [id number] Date of Birth/Sex: Jun 21, 1955 (64 y.o. F) Treating RN: Dolan Amen Primary Care Aubrey Voong: [name] Other Clinician: Jeanine Luz Referring Elin Fenley: [name] Treating Alba Kriesel/Extender: Skipper Cliche in Treatment: 32 Vital Signs Time Taken: 11:30 Temperature (F): 98.3 Height (in): 69 Pulse (bpm): 108 Respiratory Rate (breaths/min): 18 Blood Pressure (mmHg): 130/72 Reference Range: 80 - 120 mg / dl Electronic Signature(s) Signed: 07/29/2020 4:38:23 PM By: Jeanine Luz Entered By: Jeanine Luz on 07/29/2020 11:32:15

## 2020-07-29 NOTE — Progress Notes (Addendum)
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 ICD-10 Diagnosis Description E11.621 Type 2 diabetes mellitus with foot ulcer L97.522 Non-pressure chronic ulcer of other part of left foot with fat layer I73.89 Other  specified peripheral vascular diseases 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

## 2020-08-05 ENCOUNTER — Encounter: Payer: Medicare HMO | Attending: Physician Assistant | Admitting: Physician Assistant

## 2020-08-05 ENCOUNTER — Other Ambulatory Visit: Payer: Self-pay

## 2020-08-05 DIAGNOSIS — Z794 Long term (current) use of insulin: Secondary | ICD-10-CM | POA: Insufficient documentation

## 2020-08-05 DIAGNOSIS — E1142 Type 2 diabetes mellitus with diabetic polyneuropathy: Secondary | ICD-10-CM | POA: Insufficient documentation

## 2020-08-05 DIAGNOSIS — E11621 Type 2 diabetes mellitus with foot ulcer: Secondary | ICD-10-CM | POA: Insufficient documentation

## 2020-08-05 DIAGNOSIS — Z951 Presence of aortocoronary bypass graft: Secondary | ICD-10-CM | POA: Insufficient documentation

## 2020-08-05 DIAGNOSIS — Z89411 Acquired absence of right great toe: Secondary | ICD-10-CM | POA: Insufficient documentation

## 2020-08-05 DIAGNOSIS — L97522 Non-pressure chronic ulcer of other part of left foot with fat layer exposed: Secondary | ICD-10-CM | POA: Diagnosis not present

## 2020-08-05 DIAGNOSIS — E1151 Type 2 diabetes mellitus with diabetic peripheral angiopathy without gangrene: Secondary | ICD-10-CM | POA: Insufficient documentation

## 2020-08-05 DIAGNOSIS — I10 Essential (primary) hypertension: Secondary | ICD-10-CM | POA: Diagnosis not present

## 2020-08-05 NOTE — Progress Notes (Addendum)
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 Wound Laterality: Plantar, Left Cleanser: Normal Saline 3 x Per Week/30 Days Discharge Instructions: Wash your hands with soap and water. Remove old dressing, discard into plastic bag and place into trash. Cleanse the wound with Normal Saline prior to applying a clean dressing using  gauze sponges, not tissues or cotton balls. Do not scrub or use excessive force. Pat dry using gauze sponges, not tissue or cotton balls. Primary Dressing: Prisma 4.34 (in) 3 x Per Week/30 Days Discharge Instructions: Moisten w/normal saline or sterile water; Cover wound as directed. Do not remove from wound bed. Secondary Dressing: Gauze 3 x Per Week/30 Days Discharge Instructions: Apply as dry, as padding Secured With: 66M Medipore H Soft Cloth Surgical Tape, 2x2 (in/yd) 3 x Per Week/30 Days Secured With: Conforming Stretch Gauze Bandage 4x75 (in/in) 3 x Per Week/30 Days Discharge Instructions: Apply as directed 1. Would recommend currently that we going continue with wound care measures as before and the patient is in agreement the plan that includes the use of the silver collagen dressing which I think is still doing a great job. DENIM, START ([id number]) 2. Muscle recommend that we going to continue with the gauze to cover followed by roll gauze to secure in place. 3. I am also can recommend that she continue utilizing the offloading shoe to try to help with keeping pressure off of the region. We will see patient back for reevaluation in 1 week here in the clinic. If anything worsens or changes patient will contact our office for additional recommendations. Electronic Signature(s) Signed: 08/05/2020 3:55:13 PM By: [name] [name] Entered By: [name] on 08/05/2020 15:55:13 Melinda [name] ([id number]) -------------------------------------------------------------------------------- SuperBill Details Patient Name: Melinda [name] Date of Service: 08/05/2020 Medical Record Number: [id number] Patient Account Number: [id number] Date of Birth/Sex: 1955/08/01 (64 y.o. F) Treating RN: Dolan Amen Primary Care Provider: [name] Other Clinician: Referring Provider: [name] Treating Provider/Extender: Skipper Cliche in Treatment: 33 Diagnosis Coding ICD-10  Codes Code Description  E11.621 Type 2 diabetes mellitus with foot ulcer 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sed I73.89 Other specified peripheral vascular diseases E11.51 Type 2 diabetes mellitus with diabetic peripheral angiopathy without gangrene L84 Corns and callosities Facility Procedures CPT4 Code: 83507573 Description: 22567 - DEB SUBQ TISSUE 20 SQ CM/< Modifier: Quantity: 1 CPT4 Code: Description: ICD-10 Diagnosis Description L97.522 Non-pressure chronic ulcer of other part of left foot with fat layer exp Modifier: osed Quantity: Physician Procedures CPT4 Code: 2091980 Description: 11042 - WC PHYS SUBQ TISS 20 SQ CM Modifier: Quantity: 1 CPT4 Code: Description: ICD-10 Diagnosis Description L97.522 Non-pressure chronic ulcer of other part of left foot with fat layer exp Modifier: osed Quantity: Electronic Signature(s) Signed: 08/05/2020 3:55:22 PM By: [name] [name] Entered By: [name] on 08/05/2020 15:55:21

## 2020-08-06 NOTE — Progress Notes (Signed)
SHADAI, MCCLANE ([id number]) Visit Report for 07/27/2020 Arrival Information Details Patient Name: Melinda [name], LEMBERGER. Date of Service: 07/27/2020 12:30 PM Medical Record Number: [id number] Patient Account Number: [id number] Date of Birth/Sex: 12/01/1955 (64 y.o. F) Treating RN: Dolan Amen Primary Care Raffaella Edison: [name] Other Clinician: Jeanine Luz Referring Kyliegh Jester: [name] Treating Stana Bayon/Extender: Skipper Cliche in Treatment: 31 Visit Information History Since Last Visit Added or deleted any medications: No Patient Arrived: Wheel Chair Any new allergies or adverse reactions: No Arrival Time: 12:45 Had a fall or experienced change in No Accompanied By: self activities of daily living that may affect Transfer Assistance: EasyPivot Patient Lift risk of falls: Patient Identification Verified: Yes Hospitalized since last visit: No Secondary Verification Process Completed: Yes Has Dressing in Place as Prescribed: Yes Patient Requires Transmission-Based No Pain Present Now: No Precautions: Patient Has Alerts: Yes Patient Alerts: 10/28/19 ABI: R 1.05 L .60 Electronic Signature(s) Signed: 07/28/2020 4:51:14 PM By: Jeanine Luz Entered By: Jeanine Luz on 07/27/2020 12:47:51 Melinda [name] ([id number]) -------------------------------------------------------------------------------- Clinic Level of Care Assessment Details Patient Name: Melinda [name] Date of Service: 07/27/2020 12:30 PM Medical Record Number: [id number] Patient Account Number: [id number] Date of Birth/Sex: 07/15/1955 (64 y.o. F) Treating RN: Dolan Amen Primary Care Carollynn Pennywell: [name] Other Clinician: Jeanine Luz Referring Mariko Nowakowski: [name] Treating Patrycja Mumpower/Extender: Skipper Cliche in Treatment: 32 Clinic Level of Care Assessment Items TOOL 1 Quantity Score []  - Use when EandM and Procedure is performed on INITIAL visit 0 ASSESSMENTS - Nursing Assessment /  Reassessment []  - General Physical Exam (combine w/ comprehensive assessment (listed just below) when performed on new 0 pt. evals) []  - 0 Comprehensive Assessment (HX, ROS, Risk Assessments, Wounds Hx, etc.) ASSESSMENTS - Wound and Skin Assessment / Reassessment []  - Dermatologic / Skin Assessment (not related to wound area) 0 ASSESSMENTS - Ostomy and/or Continence Assessment and Care []  - Incontinence Assessment and Management 0 []  - 0 Ostomy Care Assessment and Management (repouching, etc.) PROCESS - Coordination of Care []  - Simple Patient / Family Education for ongoing care 0 []  - 0 Complex (extensive) Patient / Family Education for ongoing care []  - 0 Staff obtains Programmer, systems, Records, Test Results / Process Orders []  - 0 Staff telephones HHA, Nursing Homes / Clarify orders / etc []  - 0 Routine Transfer to another Facility (non-emergent condition) []  - 0 Routine Hospital Admission (non-emergent condition) []  - 0 New Admissions / Biomedical engineer / Ordering NPWT, Apligraf, etc. []  - 0 Emergency Hospital Admission (emergent condition) PROCESS - Special Needs []  - Pediatric / Minor Patient Management 0 []  - 0 Isolation Patient Management []  - 0 Hearing / Language / Visual special needs []  - 0 Assessment of Community assistance (transportation, D/C planning, etc.) []  - 0 Additional assistance / Altered mentation []  - 0 Support Surface(s) Assessment (bed, cushion, seat, etc.) INTERVENTIONS - Miscellaneous []  - External ear exam 0 []  - 0 Patient Transfer (multiple staff / Civil Service fast streamer / Similar devices) []  - 0 Simple Staple / Suture removal (25 or less) []  - 0 Complex Staple / Suture removal (26 or more) []  - 0 Hypo/Hyperglycemic Management (do not check if billed separately) []  - 0 Ankle / Brachial Index (ABI) - do not check if billed separately Has the patient been seen at the hospital within the last three years: Yes Total Score: 0 Level Of Care:  ____ Melinda [name] ([id number]) Electronic Signature(s) Signed: 07/27/2020 5:07:41 PM By: Georges Mouse, Minus Breeding RN Entered By: Georges Mouse,  Kenia on 07/27/2020 13:28:17 AHYANA, SKILLIN ([id number]) -------------------------------------------------------------------------------- Encounter Discharge Information Details Patient Name: [name], [name]. Date of Service: 07/27/2020 12:30 PM Medical Record Number: [id number] Patient Account Number: [id number] Date of Birth/Sex: 1955-10-31 (64 y.o. F) Treating RN: Carlene Coria Primary Care Plez Belton: [name] Other Clinician: Jeanine Luz Referring Tamicka Shimon: [name] Treating Roselia Snipe/Extender: Skipper Cliche in Treatment: 32 Encounter Discharge Information Items Post Procedure Vitals Discharge Condition: Stable Temperature (F): 98.4 Ambulatory Status: Ambulatory Pulse (bpm): 108 Discharge Destination: Home Respiratory Rate (breaths/min): 18 Transportation: Private Auto Blood Pressure (mmHg): 138/83 Accompanied By: self Schedule Follow-up Appointment: Yes Clinical Summary of Care: Patient Declined Electronic Signature(s) Signed: 08/06/2020 8:11:40 AM By: Carlene Coria RN Entered By: Carlene Coria on 07/27/2020 13:51:28 Melinda [name] ([id number]) -------------------------------------------------------------------------------- Lower Extremity Assessment Details Patient Name: Melinda [name] Date of Service: 07/27/2020 12:30 PM Medical Record Number: [id number] Patient Account Number: [id number] Date of Birth/Sex: [birth year] (64 y.o. F) Treating RN: Dolan Amen Primary Care Malikai Gut: [name] Other Clinician: Jeanine Luz Referring Nysa Sarin: [name] Treating Adelia Baptista/Extender: Skipper Cliche in Treatment: 32 Edema Assessment Assessed: [Left: Yes] [Right: No] [Left: Edema] [Right: :] Calf Left: Right: Point of Measurement: 38 cm From Medial Instep 40.5 cm Ankle Left: Right: Point of Measurement:  12 cm From Medial Instep 25 cm Vascular Assessment Pulses: Dorsalis Pedis Palpable: [Left:Yes] Electronic Signature(s) Signed: 07/27/2020 5:07:41 PM By: Georges Mouse, Minus Breeding RN Signed: 07/28/2020 4:51:14 PM By: Jeanine Luz Entered By: Jeanine Luz on 07/27/2020 12:57:13 Melinda [name] ([id number]) -------------------------------------------------------------------------------- Multi Wound Chart Details Patient Name: Melinda [name] Date of Service: 07/27/2020 12:30 PM Medical Record Number: [id number] Patient Account Number: [id number] Date of Birth/Sex: 1955-11-27 (64 y.o. F) Treating RN: Dolan Amen Primary Care Derrick Tiegs: [name] Other Clinician: Jeanine Luz Referring Krystle Polcyn: [name] Treating Mardel Grudzien/Extender: Skipper Cliche in Treatment: 32 Vital Signs Height(in): 63 Pulse(bpm): 108 Weight(lbs): Blood Pressure(mmHg): 138/83 Body Mass Index(BMI): Temperature(F): 98.4 Respiratory Rate(breaths/min): 18 Photos: [N/A:N/A] Wound Location: Left, Plantar Toe Great N/A N/A Wounding Event: Gradually Appeared N/A N/A Primary Etiology: Diabetic Wound/Ulcer of the Lower N/A N/A Extremity Comorbid History: Cataracts, Coronary Artery Disease, N/A N/A Hypertension, Myocardial Infarction, Peripheral Venous Disease, Type II Diabetes, Osteoarthritis, Osteomyelitis, Neuropathy Date Acquired: 12/02/2019 N/A N/A Weeks of Treatment: 32 N/A N/A Wound Status: Open N/A N/A Measurements L x W x D (cm) 0.5x0.4x0.2 N/A N/A Area (cm) : 0.157 N/A N/A Volume (cm) : 0.031 N/A N/A % Reduction in Area: 93.80% N/A N/A % Reduction in Volume: 98.60% N/A N/A Classification: Grade 2 N/A N/A Exudate Amount: Small N/A N/A Exudate Type: Serosanguineous N/A N/A Exudate Color: red, brown N/A N/A Wound Margin: Thickened N/A N/A Granulation Amount: Large (67-100%) N/A N/A Granulation Quality: Red, Pink N/A N/A Necrotic Amount: None Present (0%) N/A N/A Exposed  Structures: Fat Layer (Subcutaneous Tissue): N/A N/A Yes Fascia: No Tendon: No Muscle: No Joint: No Bone: No Epithelialization: None N/A N/A Treatment Notes Electronic Signature(s) Signed: 07/27/2020 5:07:41 PM By: Georges Mouse, Minus Breeding RN Entered By: Georges Mouse, Minus Breeding on 07/27/2020 13:24:03 Melinda [name] ([id number]) Davina Poke, Sonia Side ([id number]) -------------------------------------------------------------------------------- Carmen Details Patient Name: Melinda [name] Date of Service: 07/27/2020 12:30 PM Medical Record Number: [id number] Patient Account Number: [id number] Date of Birth/Sex: 07/20/1955 (64 y.o. F) Treating RN: Dolan Amen Primary Care Randol Zumstein: [name] Other Clinician: Jeanine Luz Referring Areg Bialas: [name] Treating Tabrina Esty/Extender: Skipper Cliche in Treatment: 32 Active Inactive Necrotic Tissue Nursing Diagnoses: Impaired tissue integrity related to necrotic/devitalized tissue  Goals: Necrotic/devitalized tissue will be minimized in the wound bed Date Initiated: 12/16/2019 Target Resolution Date: 12/26/2019 Goal Status: Active Interventions: Assess patient pain level pre-, during and post procedure and prior to discharge Treatment Activities: Excisional debridement : 12/16/2019 Notes: Pressure Nursing Diagnoses: Knowledge deficit related to causes and risk factors for pressure ulcer development Knowledge deficit related to management of pressures ulcers Goals: Patient will remain free from development of additional pressure ulcers Date Initiated: 12/16/2019 Target Resolution Date: 12/26/2019 Goal Status: Active Interventions: Provide education on pressure ulcers Notes: Wound/Skin Impairment Nursing Diagnoses: Impaired tissue integrity Goals: Ulcer/skin breakdown will have a volume reduction of 30% by week 4 Date Initiated: 12/16/2019 Target Resolution Date: 01/16/2020 Goal Status:  Active Interventions: Assess patient/caregiver ability to obtain necessary supplies Assess ulceration(s) every visit Treatment Activities: Skin care regimen initiated : 12/16/2019 Notes: Electronic Signature(s) ALAIJAH, GIBLER ([id number]) Signed: 07/27/2020 5:07:41 PM By: Georges Mouse, Minus Breeding RN Entered By: Georges Mouse, Minus Breeding on 07/27/2020 13:23:48 Melinda [name] ([id number]) -------------------------------------------------------------------------------- Pain Assessment Details Patient Name: Melinda [name] Date of Service: 07/27/2020 12:30 PM Medical Record Number: [id number] Patient Account Number: [id number] Date of Birth/Sex: 1955/10/12 (64 y.o. F) Treating RN: Dolan Amen Primary Care Wakeelah Solan: [name] Other Clinician: Jeanine Luz Referring Ethridge Sollenberger: [name] Treating Taym Twist/Extender: Skipper Cliche in Treatment: 32 Active Problems Location of Pain Severity and Description of Pain Patient Has Paino No Site Locations Rate the pain. Current Pain Level: 0 Pain Management and Medication Current Pain Management: Electronic Signature(s) Signed: 07/27/2020 5:07:41 PM By: Georges Mouse, Minus Breeding RN Signed: 07/28/2020 4:51:14 PM By: Jeanine Luz Entered By: Jeanine Luz on 07/27/2020 12:50:53 Melinda [name] ([id number]) -------------------------------------------------------------------------------- Patient/Caregiver Education Details Patient Name: Melinda [name] Date of Service: 07/27/2020 12:30 PM Medical Record Number: [id number] Patient Account Number: [id number] Date of Birth/Gender: 01/23/1956 (65 y.o. F) Treating RN: Dolan Amen Primary Care Physician: [name] Other Clinician: Jeanine Luz Referring Physician: [name] Treating Physician/Extender: Skipper Cliche in Treatment: 20 Education Assessment Education Provided To: Patient Education Topics Provided Wound/Skin Impairment: Methods: Explain/Verbal Responses:  State content correctly Notes TCC education Electronic Signature(s) Signed: 07/27/2020 5:07:41 PM By: Georges Mouse, Minus Breeding RN Entered By: Georges Mouse, Minus Breeding on 07/27/2020 13:28:49 Melinda [name] ([id number]) -------------------------------------------------------------------------------- Wound Assessment Details Patient Name: Melinda [name] Date of Service: 07/27/2020 12:30 PM Medical Record Number: [id number] Patient Account Number: [id number] Date of Birth/Sex: 06-17-1955 (64 y.o. F) Treating RN: Dolan Amen Primary Care Renatta Shrieves: [name] Other Clinician: Jeanine Luz Referring Danayah Smyre: [name] Treating Janisse Ghan/Extender: Skipper Cliche in Treatment: 32 Wound Status Wound Number: 5 Primary Diabetic Wound/Ulcer of the Lower Extremity Etiology: Wound Location: Left, Plantar Toe Great Wound Open Wounding Event: Gradually Appeared Status: Date Acquired: 12/02/2019 Comorbid Cataracts, Coronary Artery Disease, Hypertension, Weeks Of Treatment: 32 History: Myocardial Infarction, Peripheral Venous Disease, Type II Clustered Wound: No Diabetes, Osteoarthritis, Osteomyelitis, Neuropathy Photos Wound Measurements Length: (cm) 0.5 Width: (cm) 0.4 Depth: (cm) 0.2 Area: (cm) 0.157 Volume: (cm) 0.031 % Reduction in Area: 93.8% % Reduction in Volume: 98.6% Epithelialization: None Tunneling: No Undermining: No Wound Description Classification: Grade 2 Wound Margin: Thickened Exudate Amount: Small Exudate Type: Serosanguineous Exudate Color: red, brown Foul Odor After Cleansing: No Slough/Fibrino No Wound Bed Granulation Amount: Large (67-100%) Exposed Structure Granulation Quality: Red, Pink Fascia Exposed: No Necrotic Amount: None Present (0%) Fat Layer (Subcutaneous Tissue) Exposed: Yes Tendon Exposed: No Muscle Exposed: No Joint Exposed: No Bone Exposed: No Electronic Signature(s) Signed: 07/27/2020 5:07:41 PM By: Georges Mouse, Minus Breeding  RN Signed: 07/28/2020 4:51:14 PM By: Jeanine Luz Entered By: Jeanine Luz on 07/27/2020 12:56:09 Melinda [name] ([id number]) -------------------------------------------------------------------------------- Vitals Details Patient Name: Melinda [name] Date of Service: 07/27/2020 12:30 PM Medical Record Number: [id number] Patient Account Number: [id number] Date of Birth/Sex: 1955-07-15 (64 y.o. F) Treating RN: Dolan Amen Primary Care Maebry Obrien: [name] Other Clinician: Jeanine Luz Referring Kalyse Meharg: [name] Treating Kirubel Aja/Extender: Skipper Cliche in Treatment: 32 Vital Signs Time Taken: 12:48 Temperature (F): 98.4 Height (in): 69 Pulse (bpm): 108 Respiratory Rate (breaths/min): 18 Blood Pressure (mmHg): 138/83 Reference Range: 80 - 120 mg / dl Electronic Signature(s) Signed: 07/28/2020 4:51:14 PM By: Jeanine Luz Entered By: Jeanine Luz on 07/27/2020 12:49:35

## 2020-08-06 NOTE — Progress Notes (Addendum)
Melinda [name], Melinda [name] ([id number]) Visit Report for 08/05/2020 Arrival Information Details Patient Name: Melinda [name], Melinda [name] Date of Service: 08/05/2020 2:45 PM Medical Record Number: [id number] Patient Account Number: [id number] Date of Birth/Sex: [birth year] (64 y.o. F) Treating RN: Dolan Amen Primary Care Adileny Delon: [name] Other Clinician: Referring Sahara Fujimoto: [name] Treating Panfilo Ketchum/Extender: Skipper Cliche in Treatment: 33 Visit Information History Since Last Visit Added or deleted any medications: No Patient Arrived: Ambulatory Had a fall or experienced change in No Arrival Time: 15:25 activities of daily living that may affect Accompanied By: self risk of falls: Transfer Assistance: None Hospitalized since last visit: No Patient Identification Verified: Yes Pain Present Now: No Secondary Verification Process Completed: Yes Patient Requires Transmission-Based No Precautions: Patient Has Alerts: Yes Patient Alerts: 10/28/19 ABI: R 1.05 L .60 Electronic Signature(s) Signed: 08/06/2020 8:09:31 AM By: Jeanine Luz Entered By: Jeanine Luz on 08/05/2020 15:26:04 Melinda [name] ([id number]) -------------------------------------------------------------------------------- Clinic Level of Care Assessment Details Patient Name: Melinda [name] Date of Service: 08/05/2020 2:45 PM Medical Record Number: [id number] Patient Account Number: [id number] Date of Birth/Sex: [birth year] (64 y.o. F) Treating RN: Dolan Amen Primary Care Tiant Peixoto: [name] Other Clinician: Referring Mera Gunkel: [name] Treating Margarette Vannatter/Extender: Skipper Cliche in Treatment: 33 Clinic Level of Care Assessment Items TOOL 1 Quantity Score []  - Use when EandM and Procedure is performed on INITIAL visit 0 ASSESSMENTS - Nursing Assessment / Reassessment []  - General Physical Exam (combine w/ comprehensive assessment (listed just below) when performed on new 0 pt. evals) []  -  0 Comprehensive Assessment (HX, ROS, Risk Assessments, Wounds Hx, etc.) ASSESSMENTS - Wound and Skin Assessment / Reassessment []  - Dermatologic / Skin Assessment (not related to wound area) 0 ASSESSMENTS - Ostomy and/or Continence Assessment and Care []  - Incontinence Assessment and Management 0 []  - 0 Ostomy Care Assessment and Management (repouching, etc.) PROCESS - Coordination of Care []  - Simple Patient / Family Education for ongoing care 0 []  - 0 Complex (extensive) Patient / Family Education for ongoing care []  - 0 Staff obtains Programmer, systems, Records, Test Results / Process Orders []  - 0 Staff telephones HHA, Nursing Homes / Clarify orders / etc []  - 0 Routine Transfer to another Facility (non-emergent condition) []  - 0 Routine Hospital Admission (non-emergent condition) []  - 0 New Admissions / Biomedical engineer / Ordering NPWT, Apligraf, etc. []  - 0 Emergency Hospital Admission (emergent condition) PROCESS - Special Needs []  - Pediatric / Minor Patient Management 0 []  - 0 Isolation Patient Management []  - 0 Hearing / Language / Visual special needs []  - 0 Assessment of Community assistance (transportation, D/C planning, etc.) []  - 0 Additional assistance / Altered mentation []  - 0 Support Surface(s) Assessment (bed, cushion, seat, etc.) INTERVENTIONS - Miscellaneous []  - External ear exam 0 []  - 0 Patient Transfer (multiple staff / Civil Service fast streamer / Similar devices) []  - 0 Simple Staple / Suture removal (25 or less) []  - 0 Complex Staple / Suture removal (26 or more) []  - 0 Hypo/Hyperglycemic Management (do not check if billed separately) []  - 0 Ankle / Brachial Index (ABI) - do not check if billed separately Has the patient been seen at the hospital within the last three years: Yes Total Score: 0 Level Of Care: ____ Melinda [name] ([id number]) Electronic Signature(s) Signed: 08/05/2020 5:35:33 PM By: Georges Mouse, Minus Breeding RN Entered By: Georges Mouse,  Kenia on 08/05/2020 15:53:13 Melinda [name] ([id number]) -------------------------------------------------------------------------------- Lower Extremity Assessment Details Patient Name: Melinda [name] Date of Service:  08/05/2020 2:45 PM Medical Record Number: [id number] Patient Account Number: [id number] Date of Birth/Sex: 1955-07-20 (64 y.o. F) Treating RN: Dolan Amen Primary Care Simuel Stebner: [name] Other Clinician: Referring Quadir Muns: [name] Treating Emryn Flanery/Extender: Skipper Cliche in Treatment: 33 Edema Assessment Assessed: [Left: No] [Right: No] [Left: Edema] [Right: :] Calf Left: Right: Point of Measurement: 38 cm From Medial Instep 38.5 cm Ankle Left: Right: Point of Measurement: 12 cm From Medial Instep 27 cm Vascular Assessment Pulses: Dorsalis Pedis Palpable: [Left:Yes] Electronic Signature(s) Signed: 08/05/2020 5:35:33 PM By: Georges Mouse, Minus Breeding RN Signed: 08/06/2020 8:09:31 AM By: Jeanine Luz Entered By: Jeanine Luz on 08/05/2020 15:33:07 Melinda [name] ([id number]) -------------------------------------------------------------------------------- Multi Wound Chart Details Patient Name: Melinda [name] Date of Service: 08/05/2020 2:45 PM Medical Record Number: [id number] Patient Account Number: [id number] Date of Birth/Sex: 24-Sep-1955 (64 y.o. F) Treating RN: Dolan Amen Primary Care Osamu Olguin: [name] Other Clinician: Referring Jleigh Striplin: [name] Treating Kamoria Lucien/Extender: Skipper Cliche in Treatment: 33 Vital Signs Height(in): 96 Pulse(bpm): 101 Weight(lbs): Blood Pressure(mmHg): 101/61 Body Mass Index(BMI): Temperature(F): 97.9 Respiratory Rate(breaths/min): 18 Photos: [N/A:N/A] Wound Location: Left, Plantar Toe Great N/A N/A Wounding Event: Gradually Appeared N/A N/A Primary Etiology: Diabetic Wound/Ulcer of the Lower N/A N/A Extremity Comorbid History: Cataracts, Coronary Artery Disease, N/A  N/A Hypertension, Myocardial Infarction, Peripheral Venous Disease, Type II Diabetes, Osteoarthritis, Osteomyelitis, Neuropathy Date Acquired: 12/02/2019 N/A N/A Weeks of Treatment: 33 N/A N/A Wound Status: Open N/A N/A Measurements L x W x D (cm) 0.3x0.2x0.3 N/A N/A Area (cm) : 0.047 N/A N/A Volume (cm) : 0.014 N/A N/A % Reduction in Area: 98.10% N/A N/A % Reduction in Volume: 99.40% N/A N/A Classification: Grade 2 N/A N/A Exudate Amount: Small N/A N/A Exudate Type: Serosanguineous N/A N/A Exudate Color: red, brown N/A N/A Wound Margin: Thickened N/A N/A Granulation Amount: Large (67-100%) N/A N/A Granulation Quality: Red, Pink N/A N/A Necrotic Amount: None Present (0%) N/A N/A Exposed Structures: Fat Layer (Subcutaneous Tissue): N/A N/A Yes Fascia: No Tendon: No Muscle: No Joint: No Bone: No Epithelialization: None N/A N/A Treatment Notes Electronic Signature(s) Signed: 08/05/2020 5:35:33 PM By: Georges Mouse, Minus Breeding RN Entered By: Georges Mouse, Kenia on 08/05/2020 15:50:39 Melinda [name] ([id number]) Davina Poke, Sonia Side ([id number]) -------------------------------------------------------------------------------- [place] Details Patient Name: Melinda [name] Date of Service: 08/05/2020 2:45 PM Medical Record Number: [id number] Patient Account Number: [id number] Date of Birth/Sex: 06/21/1955 (64 y.o. F) Treating RN: Dolan Amen Primary Care Waylan Busta: [name] Other Clinician: Referring Farhaan Mabee: [name] Treating Copelan Maultsby/Extender: Skipper Cliche in Treatment: 95 Active Inactive Electronic Signature(s) Signed: 09/17/2020 4:10:48 PM By: [name], [name], RN, [name], [name] RN, [name] Signed: 02/18/2021 5:08:20 PM By: Dolan Amen RN Previous Signature: 08/05/2020 5:35:33 PM Version By: Georges Mouse, Minus Breeding RN Entered By: [name], [name], RN, [name], [name] on 09/17/2020 16:10:48 Melinda [name]  ([id number]) -------------------------------------------------------------------------------- Pain Assessment Details Patient Name: Melinda [name], Melinda [name] Date of Service: 08/05/2020 2:45 PM Medical Record Number: [id number] Patient Account Number: [id number] Date of Birth/Sex: 1955/06/05 (64 y.o. F) Treating RN: Dolan Amen Primary Care Abygale Karpf: [name] Other Clinician: Referring Awilda Covin: [name] Treating Hevin Jeffcoat/Extender: Skipper Cliche in Treatment: 33 Active Problems Location of Pain Severity and Description of Pain Patient Has Paino No Site Locations Rate the pain. Current Pain Level: 0 Pain Management and Medication Current Pain Management: Electronic Signature(s) Signed: 08/05/2020 5:35:33 PM By: Georges Mouse, Minus Breeding RN Signed: 08/06/2020 8:09:31 AM By: Jeanine Luz Entered By: Jeanine Luz on 08/05/2020 15:26:45 Melinda [name] ([id number]) -------------------------------------------------------------------------------- Patient/Caregiver  Education Details Patient Name: Melinda [name], Melinda [name] Date of Service: 08/05/2020 2:45 PM Medical Record Number: [id number] Patient Account Number: [id number] Date of Birth/Gender: 12/02/1955 (66 y.o. F) Treating RN: Dolan Amen Primary Care Physician: [name] Other Clinician: Referring Physician: [name] Treating Physician/Extender: Skipper Cliche in Treatment: 73 Education Assessment Education Provided To: Patient Education Topics Provided Wound/Skin Impairment: Methods: Explain/Verbal Responses: State content correctly Electronic Signature(s) Signed: 08/05/2020 5:35:33 PM By: Georges Mouse, Minus Breeding RN Entered By: Georges Mouse, Minus Breeding on 08/05/2020 15:53:30 Melinda [name] ([id number]) -------------------------------------------------------------------------------- Wound Assessment Details Patient Name: Melinda [name] Date of Service: 08/05/2020 2:45 PM Medical Record Number: [id number] Patient Account  Number: [id number] Date of Birth/Sex: [birth year] (64 y.o. F) Treating RN: Dolan Amen Primary Care Kaisy Severino: [name] Other Clinician: Referring Jirah Rider: [name] Treating Lexiana Spindel/Extender: Skipper Cliche in Treatment: 33 Wound Status Wound Number: 5 Primary Diabetic Wound/Ulcer of the Lower Extremity Etiology: Wound Location: Left, Plantar Toe Great Wound Open Wounding Event: Gradually Appeared Status: Date Acquired: 12/02/2019 Comorbid Cataracts, Coronary Artery Disease, Hypertension, Weeks Of Treatment: 33 History: Myocardial Infarction, Peripheral Venous Disease, Type II Clustered Wound: No Diabetes, Osteoarthritis, Osteomyelitis, Neuropathy Photos Wound Measurements Length: (cm) 0.3 Width: (cm) 0.2 Depth: (cm) 0.3 Area: (cm) 0.047 Volume: (cm) 0.014 % Reduction in Area: 98.1% % Reduction in Volume: 99.4% Epithelialization: None Tunneling: No Undermining: No Wound Description Classification: Grade 2 Wound Margin: Thickened Exudate Amount: Small Exudate Type: Serosanguineous Exudate Color: red, brown Foul Odor After Cleansing: No Slough/Fibrino No Wound Bed Granulation Amount: Large (67-100%) Exposed Structure Granulation Quality: Red, Pink Fascia Exposed: No Necrotic Amount: None Present (0%) Fat Layer (Subcutaneous Tissue) Exposed: Yes Tendon Exposed: No Muscle Exposed: No Joint Exposed: No Bone Exposed: No Electronic Signature(s) Signed: 08/05/2020 5:35:33 PM By: Georges Mouse, Minus Breeding RN Signed: 08/06/2020 8:09:31 AM By: Jeanine Luz Entered By: Jeanine Luz on 08/05/2020 15:31:58 Melinda [name] ([id number]) -------------------------------------------------------------------------------- Vitals Details Patient Name: Melinda [name] Date of Service: 08/05/2020 2:45 PM Medical Record Number: [id number] Patient Account Number: [id number] Date of Birth/Sex: 08/29/1955 (64 y.o. F) Treating RN: Dolan Amen Primary Care Jari Carollo: [name] Other Clinician: Referring Graycen Sadlon: [name] Treating Sherly Brodbeck/Extender: Skipper Cliche in Treatment: 33 Vital Signs Time Taken: 15:23 Temperature (F): 97.9 Height (in): 69 Pulse (bpm): 101 Respiratory Rate (breaths/min): 18 Blood Pressure (mmHg): 101/61 Reference Range: 80 - 120 mg / dl Electronic Signature(s) Signed: 08/06/2020 8:09:31 AM By: Jeanine Luz Entered By: Jeanine Luz on 08/05/2020 15:26:26

## 2020-08-07 ENCOUNTER — Other Ambulatory Visit: Payer: Self-pay | Admitting: Cardiovascular Disease

## 2020-08-11 ENCOUNTER — Emergency Department: Payer: Medicare HMO

## 2020-08-11 ENCOUNTER — Inpatient Hospital Stay
Admission: EM | Admit: 2020-08-11 | Discharge: 2020-08-19 | DRG: 177 | Disposition: A | Discharge status: Home Health | Payer: Medicare HMO | Attending: Internal Medicine | Admitting: Internal Medicine

## 2020-08-11 ENCOUNTER — Other Ambulatory Visit: Payer: Self-pay

## 2020-08-11 DIAGNOSIS — R296 Repeated falls: Secondary | ICD-10-CM | POA: Diagnosis present

## 2020-08-11 DIAGNOSIS — Y846 Urinary catheterization as the cause of abnormal reaction of the patient, or of later complication, without mention of misadventure at the time of the procedure: Secondary | ICD-10-CM | POA: Diagnosis not present

## 2020-08-11 DIAGNOSIS — Z89422 Acquired absence of other left toe(s): Secondary | ICD-10-CM

## 2020-08-11 DIAGNOSIS — N1832 Chronic kidney disease, stage 3b: Secondary | ICD-10-CM | POA: Diagnosis present

## 2020-08-11 DIAGNOSIS — E1142 Type 2 diabetes mellitus with diabetic polyneuropathy: Secondary | ICD-10-CM | POA: Diagnosis present

## 2020-08-11 DIAGNOSIS — K219 Gastro-esophageal reflux disease without esophagitis: Secondary | ICD-10-CM | POA: Diagnosis present

## 2020-08-11 DIAGNOSIS — E872 Acidosis: Secondary | ICD-10-CM | POA: Diagnosis present

## 2020-08-11 DIAGNOSIS — N390 Urinary tract infection, site not specified: Secondary | ICD-10-CM | POA: Diagnosis not present

## 2020-08-11 DIAGNOSIS — E1151 Type 2 diabetes mellitus with diabetic peripheral angiopathy without gangrene: Secondary | ICD-10-CM | POA: Diagnosis present

## 2020-08-11 DIAGNOSIS — Z952 Presence of prosthetic heart valve: Secondary | ICD-10-CM

## 2020-08-11 DIAGNOSIS — E669 Obesity, unspecified: Secondary | ICD-10-CM | POA: Diagnosis present

## 2020-08-11 DIAGNOSIS — I5043 Acute on chronic combined systolic (congestive) and diastolic (congestive) heart failure: Secondary | ICD-10-CM | POA: Diagnosis present

## 2020-08-11 DIAGNOSIS — I130 Hypertensive heart and chronic kidney disease with heart failure and stage 1 through stage 4 chronic kidney disease, or unspecified chronic kidney disease: Secondary | ICD-10-CM | POA: Diagnosis present

## 2020-08-11 DIAGNOSIS — J690 Pneumonitis due to inhalation of food and vomit: Secondary | ICD-10-CM | POA: Diagnosis present

## 2020-08-11 DIAGNOSIS — E785 Hyperlipidemia, unspecified: Secondary | ICD-10-CM | POA: Diagnosis present

## 2020-08-11 DIAGNOSIS — I2510 Atherosclerotic heart disease of native coronary artery without angina pectoris: Secondary | ICD-10-CM | POA: Diagnosis present

## 2020-08-11 DIAGNOSIS — I252 Old myocardial infarction: Secondary | ICD-10-CM

## 2020-08-11 DIAGNOSIS — J9601 Acute respiratory failure with hypoxia: Secondary | ICD-10-CM | POA: Diagnosis present

## 2020-08-11 DIAGNOSIS — J449 Chronic obstructive pulmonary disease, unspecified: Secondary | ICD-10-CM | POA: Diagnosis present

## 2020-08-11 DIAGNOSIS — I4892 Unspecified atrial flutter: Secondary | ICD-10-CM | POA: Diagnosis present

## 2020-08-11 DIAGNOSIS — Z20822 Contact with and (suspected) exposure to covid-19: Secondary | ICD-10-CM | POA: Diagnosis present

## 2020-08-11 DIAGNOSIS — F32A Depression, unspecified: Secondary | ICD-10-CM | POA: Diagnosis present

## 2020-08-11 DIAGNOSIS — E1165 Type 2 diabetes mellitus with hyperglycemia: Secondary | ICD-10-CM | POA: Diagnosis present

## 2020-08-11 DIAGNOSIS — R0602 Shortness of breath: Secondary | ICD-10-CM

## 2020-08-11 DIAGNOSIS — Z833 Family history of diabetes mellitus: Secondary | ICD-10-CM

## 2020-08-11 DIAGNOSIS — Z89421 Acquired absence of other right toe(s): Secondary | ICD-10-CM

## 2020-08-11 DIAGNOSIS — Z951 Presence of aortocoronary bypass graft: Secondary | ICD-10-CM

## 2020-08-11 DIAGNOSIS — R17 Unspecified jaundice: Secondary | ICD-10-CM | POA: Diagnosis present

## 2020-08-11 DIAGNOSIS — Z7901 Long term (current) use of anticoagulants: Secondary | ICD-10-CM

## 2020-08-11 DIAGNOSIS — I480 Paroxysmal atrial fibrillation: Secondary | ICD-10-CM | POA: Diagnosis present

## 2020-08-11 DIAGNOSIS — T83518A Infection and inflammatory reaction due to other urinary catheter, initial encounter: Secondary | ICD-10-CM | POA: Diagnosis not present

## 2020-08-11 DIAGNOSIS — G9341 Metabolic encephalopathy: Secondary | ICD-10-CM | POA: Diagnosis present

## 2020-08-11 DIAGNOSIS — Z89412 Acquired absence of left great toe: Secondary | ICD-10-CM

## 2020-08-11 DIAGNOSIS — A419 Sepsis, unspecified organism: Secondary | ICD-10-CM | POA: Diagnosis not present

## 2020-08-11 DIAGNOSIS — I5021 Acute systolic (congestive) heart failure: Secondary | ICD-10-CM | POA: Diagnosis not present

## 2020-08-11 DIAGNOSIS — R0603 Acute respiratory distress: Secondary | ICD-10-CM | POA: Diagnosis present

## 2020-08-11 DIAGNOSIS — N179 Acute kidney failure, unspecified: Secondary | ICD-10-CM | POA: Diagnosis present

## 2020-08-11 DIAGNOSIS — R1310 Dysphagia, unspecified: Secondary | ICD-10-CM | POA: Diagnosis present

## 2020-08-11 DIAGNOSIS — Z794 Long term (current) use of insulin: Secondary | ICD-10-CM

## 2020-08-11 DIAGNOSIS — Z79899 Other long term (current) drug therapy: Secondary | ICD-10-CM

## 2020-08-11 DIAGNOSIS — I255 Ischemic cardiomyopathy: Secondary | ICD-10-CM | POA: Diagnosis present

## 2020-08-11 DIAGNOSIS — E1122 Type 2 diabetes mellitus with diabetic chronic kidney disease: Secondary | ICD-10-CM | POA: Diagnosis present

## 2020-08-11 DIAGNOSIS — Z6837 Body mass index (BMI) 37.0-37.9, adult: Secondary | ICD-10-CM

## 2020-08-11 DIAGNOSIS — Z9884 Bariatric surgery status: Secondary | ICD-10-CM

## 2020-08-11 LAB — TROPONIN I (HIGH SENSITIVITY)
Troponin I (High Sensitivity): 6 ng/L (ref <18)
Troponin I (High Sensitivity): 8 ng/L (ref <18)

## 2020-08-11 LAB — BLOOD GAS, ARTERIAL
Acid-Base Excess: 1.5 mmol/L (ref 0.0–2.0)
Bicarbonate: 26.6 mmol/L (ref 20.0–28.0)
Delivery systems: POSITIVE
Expiratory PAP: 5
FIO2: 1
Inspiratory PAP: 10
O2 Saturation: 98.5 %
Patient temperature: 37
pCO2 arterial: 43 mmHg (ref 32.0–48.0)
pH, Arterial: 7.4 (ref 7.350–7.450)
pO2, Arterial: 116 mmHg — ABNORMAL HIGH (ref 83.0–108.0)

## 2020-08-11 LAB — CBC WITH DIFFERENTIAL/PLATELET
Abs Immature Granulocytes: 0.12 10*3/uL — ABNORMAL HIGH (ref 0.00–0.07)
Basophils Absolute: 0 10*3/uL (ref 0.0–0.1)
Basophils Relative: 0 %
Eosinophils Absolute: 0 10*3/uL (ref 0.0–0.5)
Eosinophils Relative: 0 %
HCT: 37.6 % (ref 36.0–46.0)
Hemoglobin: 12.1 g/dL (ref 12.0–15.0)
Immature Granulocytes: 1 %
Lymphocytes Relative: 13 %
Lymphs Abs: 1.7 10*3/uL (ref 0.7–4.0)
MCH: 24.6 pg — ABNORMAL LOW (ref 26.0–34.0)
MCHC: 32.2 g/dL (ref 30.0–36.0)
MCV: 76.4 fL — ABNORMAL LOW (ref 80.0–100.0)
Monocytes Absolute: 1.3 10*3/uL — ABNORMAL HIGH (ref 0.1–1.0)
Monocytes Relative: 11 %
Neutro Abs: 9.4 10*3/uL — ABNORMAL HIGH (ref 1.7–7.7)
Neutrophils Relative %: 75 %
Platelets: 331 10*3/uL (ref 150–400)
RBC: 4.92 MIL/uL (ref 3.87–5.11)
RDW: 24.1 % — ABNORMAL HIGH (ref 11.5–15.5)
Smear Review: NORMAL
WBC: 12.5 10*3/uL — ABNORMAL HIGH (ref 4.0–10.5)
nRBC: 0.6 % — ABNORMAL HIGH (ref 0.0–0.2)

## 2020-08-11 LAB — URINALYSIS, COMPLETE (UACMP) WITH MICROSCOPIC
Bacteria, UA: NONE SEEN
Bilirubin Urine: NEGATIVE
Glucose, UA: NEGATIVE mg/dL
Hgb urine dipstick: NEGATIVE
Ketones, ur: 5 mg/dL — AB
Leukocytes,Ua: NEGATIVE
Nitrite: NEGATIVE
Protein, ur: 30 mg/dL — AB
Specific Gravity, Urine: 1.011 (ref 1.005–1.030)
pH: 6 (ref 5.0–8.0)

## 2020-08-11 LAB — COMPREHENSIVE METABOLIC PANEL
ALT: 42 U/L (ref 0–44)
AST: 61 U/L — ABNORMAL HIGH (ref 15–41)
Albumin: 2.8 g/dL — ABNORMAL LOW (ref 3.5–5.0)
Alkaline Phosphatase: 121 U/L (ref 38–126)
Anion gap: 14 (ref 5–15)
BUN: 15 mg/dL (ref 8–23)
CO2: 20 mmol/L — ABNORMAL LOW (ref 22–32)
Calcium: 8.4 mg/dL — ABNORMAL LOW (ref 8.9–10.3)
Chloride: 101 mmol/L (ref 98–111)
Creatinine, Ser: 1.6 mg/dL — ABNORMAL HIGH (ref 0.44–1.00)
GFR, Estimated: 35 mL/min — ABNORMAL LOW (ref >60)
Glucose, Bld: 243 mg/dL — ABNORMAL HIGH (ref 70–99)
Potassium: 4.7 mmol/L (ref 3.5–5.1)
Sodium: 135 mmol/L (ref 135–145)
Total Bilirubin: 2.9 mg/dL — ABNORMAL HIGH (ref 0.3–1.2)
Total Protein: 6.6 g/dL (ref 6.5–8.1)

## 2020-08-11 LAB — BLOOD GAS, VENOUS
Acid-base deficit: 6.6 mmol/L — ABNORMAL HIGH (ref 0.0–2.0)
Bicarbonate: 20.6 mmol/L (ref 20.0–28.0)
O2 Saturation: 49.8 %
Patient temperature: 37
pCO2, Ven: 47 mmHg (ref 44.0–60.0)
pH, Ven: 7.25 (ref 7.250–7.430)
pO2, Ven: 32 mmHg (ref 32.0–45.0)

## 2020-08-11 LAB — PROCALCITONIN: Procalcitonin: <0.1 ng/mL

## 2020-08-11 LAB — RESP PANEL BY RT-PCR (FLU A&B, COVID) ARPGX2
Influenza A by PCR: NEGATIVE
Influenza B by PCR: NEGATIVE
SARS Coronavirus 2 by RT PCR: NEGATIVE

## 2020-08-11 LAB — LACTIC ACID, PLASMA
Lactic Acid, Venous: 7.6 mmol/L (ref 0.5–1.9)
Lactic Acid, Venous: 2.8 mmol/L (ref 0.5–1.9)

## 2020-08-11 LAB — GLUCOSE, CAPILLARY: Glucose-Capillary: 171 mg/dL — ABNORMAL HIGH (ref 70–99)

## 2020-08-11 LAB — BRAIN NATRIURETIC PEPTIDE: B Natriuretic Peptide: 548.4 pg/mL — ABNORMAL HIGH (ref 0.0–100.0)

## 2020-08-11 LAB — MRSA PCR SCREENING: MRSA by PCR: NEGATIVE

## 2020-08-11 MED ORDER — SODIUM CHLORIDE 0.9 % IV SOLN
2.0000 g | Freq: Once | INTRAVENOUS | Status: AC
  Administered 2020-08-11: 2 g via INTRAVENOUS
  Filled 2020-08-11: qty 2

## 2020-08-11 MED ORDER — SODIUM CHLORIDE 0.9 % IV BOLUS
500.0000 mL | Freq: Once | INTRAVENOUS | Status: AC
  Administered 2020-08-11: 500 mL via INTRAVENOUS

## 2020-08-11 MED ORDER — OXYBUTYNIN CHLORIDE 5 MG PO TABS
5.0000 mg | ORAL_TABLET | Freq: Two times a day (BID) | ORAL | Status: DC
  Administered 2020-08-12 – 2020-08-19 (×12): 5 mg via ORAL
  Filled 2020-08-11 (×17): qty 1

## 2020-08-11 MED ORDER — FERROUS SULFATE 325 (65 FE) MG PO TABS
325.0000 mg | ORAL_TABLET | Freq: Every day | ORAL | Status: DC
  Administered 2020-08-12 – 2020-08-19 (×7): 325 mg via ORAL
  Filled 2020-08-11 (×8): qty 1

## 2020-08-11 MED ORDER — IOHEXOL 350 MG/ML SOLN
75.0000 mL | Freq: Once | INTRAVENOUS | Status: AC | PRN
  Administered 2020-08-11: 75 mL via INTRAVENOUS

## 2020-08-11 MED ORDER — ORAL CARE MOUTH RINSE
15.0000 mL | Freq: Two times a day (BID) | OROMUCOSAL | Status: DC
  Administered 2020-08-12: 15 mL via OROMUCOSAL

## 2020-08-11 MED ORDER — CHLORHEXIDINE GLUCONATE 0.12 % MT SOLN
15.0000 mL | Freq: Two times a day (BID) | OROMUCOSAL | Status: DC
  Administered 2020-08-12 – 2020-08-18 (×10): 15 mL via OROMUCOSAL
  Filled 2020-08-11 (×12): qty 15

## 2020-08-11 MED ORDER — FUROSEMIDE 10 MG/ML IJ SOLN
40.0000 mg | Freq: Once | INTRAMUSCULAR | Status: AC
  Administered 2020-08-11: 40 mg via INTRAVENOUS
  Filled 2020-08-11: qty 4

## 2020-08-11 MED ORDER — PIPERACILLIN-TAZOBACTAM 3.375 G IVPB
3.3750 g | Freq: Three times a day (TID) | INTRAVENOUS | Status: DC
  Administered 2020-08-11 – 2020-08-13 (×5): 3.375 g via INTRAVENOUS
  Filled 2020-08-11 (×5): qty 50

## 2020-08-11 MED ORDER — CHLORHEXIDINE GLUCONATE CLOTH 2 % EX PADS
6.0000 | MEDICATED_PAD | Freq: Every day | CUTANEOUS | Status: DC
  Administered 2020-08-11 – 2020-08-18 (×2): 6 via TOPICAL

## 2020-08-11 MED ORDER — PANTOPRAZOLE SODIUM 40 MG PO TBEC
40.0000 mg | DELAYED_RELEASE_TABLET | Freq: Every day | ORAL | Status: DC
  Administered 2020-08-12 – 2020-08-19 (×7): 40 mg via ORAL
  Filled 2020-08-11 (×8): qty 1

## 2020-08-11 MED ORDER — METRONIDAZOLE IN NACL 5-0.79 MG/ML-% IV SOLN
500.0000 mg | Freq: Once | INTRAVENOUS | Status: AC
  Administered 2020-08-11: 500 mg via INTRAVENOUS
  Filled 2020-08-11: qty 100

## 2020-08-11 MED ORDER — ACETAMINOPHEN 500 MG PO TABS
1000.0000 mg | ORAL_TABLET | Freq: Once | ORAL | Status: DC
  Filled 2020-08-11: qty 2

## 2020-08-11 MED ORDER — DULOXETINE HCL 30 MG PO CPEP
60.0000 mg | ORAL_CAPSULE | Freq: Every day | ORAL | Status: DC
  Administered 2020-08-12 – 2020-08-15 (×4): 60 mg via ORAL
  Filled 2020-08-11 (×4): qty 2

## 2020-08-11 MED ORDER — METOPROLOL TARTRATE 25 MG PO TABS: 12.5000 mg | ORAL_TABLET | Freq: Two times a day (BID) | ORAL | Status: DC

## 2020-08-11 MED ORDER — VANCOMYCIN HCL IN DEXTROSE 1-5 GM/200ML-% IV SOLN
1000.0000 mg | Freq: Once | INTRAVENOUS | Status: AC
  Administered 2020-08-11: 1000 mg via INTRAVENOUS
  Filled 2020-08-11: qty 200

## 2020-08-11 MED ORDER — APIXABAN 5 MG PO TABS
5.0000 mg | ORAL_TABLET | Freq: Two times a day (BID) | ORAL | Status: DC
  Administered 2020-08-12 – 2020-08-19 (×13): 5 mg via ORAL
  Filled 2020-08-11 (×14): qty 1

## 2020-08-11 NOTE — H&P (Addendum)
History and Physical  Melinda [name] GYI:[id number] DOB: July 05, 1955 DOA: 08/11/2020  Referring physician: Dr. Charna Archer, [place] PCP: Donnie Coffin, MD  Outpatient Specialists: Cardiology. Patient coming from: Home.  Chief Complaint: [place] on the bus.  HPI: Melinda [name] is a 65 y.o. female with medical history significant for chronic left foot wound, type 2 diabetes, atrial flutter on Eliquis, coronary artery disease status post CABG in 2016, GERD, essential hypertension, hyperlipidemia, obesity, severe peripheral artery disease who presented to [facility] ED after falling on the bus.  When EMS arrived she was in A. fib with RVR and was having trouble breathing with oxygen saturation in the 60s on room air.  She received 2 shocks by EMS and was cardioverted back to sinus rhythm.  She reports choking on her food when she eats, also intermittent nausea with dry heaving for the past 3 months.  Interview is limited due to the patient being on BiPAP.  Her sister, who is also her medical power of attorney, is present in the room and assisting with providing a history.  She has had frequent falls for the past few months.  She was brought into the ED for further evaluation and management.  While in the ED chest x-ray revealed significant bilateral pulmonary infiltrates and severe hypoxemia.  COVID-19 screening test was negative.  She was placed on BiPAP.  TRH, hospitalist team, was asked to admit.  ED Course:  Febrile with T-max of 100.6, BP 153/93, heart rate 89, respiratory rate 18, O2 saturation 95% on BiPAP.  Lab studies remarkable for lactic acid initially 7.6, down to 2.8, WBC 12.5K, serum glucose 243, creatinine 1.60, total bilirubin 2.9, BNP 548, troponin negative x2.  Review of Systems: Review of systems as noted in the HPI. All other systems reviewed and are negative.   Past Medical History:  Diagnosis Date  . Anxiety   . Atrial fibrillation ([place])   . Atrial flutter ([place])   . Coronary artery  disease    a. 06/2014 NSTEMI s/p CABG x 4 (LIMA to LAD, VG to Diag, VG to OM, VG to PDA).  . Depression   . Diabetic neuropathy ([place])   . GERD (gastroesophageal reflux disease)   . HTN (hypertension)   . Hyperlipidemia LDL goal <70   . IDDM (insulin dependent diabetes mellitus)   . Ischemic cardiomyopathy    a. 06/2014 Echo: EF 40-45%, HK of entire inferolateral and inferior myocardium c/w infarct of RCA/LCx, GR2DD, mild MR  . Myocardial infarction ([place]) 2016  . OA (osteoarthritis) of knee   . Obesity   . Osteoarthritis   . Osteomyelitis ([place])   . Peripheral neuropathy   . Peripheral vascular disease ([place])    a. 09/2016 Periph Angio: CTO R popliteal, CTO L prox/mid SFA->Med Rx; b. 05/2017 PTA: LSFA (Viabahn covered stent x 2), DEB to L Post Tibial; c. 06/2017 ABI: R 0.44, L 1.05.  . Tobacco abuse    Past Surgical History:  Procedure Laterality Date  . ABDOMINAL AORTOGRAM W/LOWER EXTREMITY N/A 10/27/2016   Procedure: Abdominal Aortogram w/Lower Extremity;  Surgeon: Nelva Bush, MD;  Location: [place] [facility];  Service: Cardiovascular;  Laterality: N/A;  . AMPUTATION TOE Left 10/21/2015   Procedure: AMPUTATION TOE;  Surgeon: Sharlotte Alamo, DPM;  Location: [facility];  Service: Podiatry;  Laterality: Left;  . AMPUTATION TOE Left 05/15/2017   Procedure: AMPUTATION LEFT GREAT TOE;  Surgeon: Sharlotte Alamo, DPM;  Location: [facility];  Service: Podiatry;  Laterality: Left;  .  AORTIC VALVE REPLACEMENT (AVR)/CORONARY ARTERY BYPASS GRAFTING (CABG)    . BREAST BIOPSY    . CARDIAC CATHETERIZATION  06/2014   95% stenosis mLAD, occlusion ostial OM1, 70% stenosis LCx, 95% stenosis mRCA, EF 45%.  Marland Kitchen CARDIOVERSION N/A 04/14/2020   Procedure: CARDIOVERSION with TEE;  Surgeon: Minna Merritts, MD;  Location: [facility];  Service: Cardiovascular;  Laterality: N/A;  . CARDIOVERSION N/A 05/28/2020   Procedure: CARDIOVERSION;  Surgeon: Minna Merritts, MD;  Location: [facility];  Service: Cardiovascular;   Laterality: N/A;  . CORONARY ARTERY BYPASS GRAFT N/A 06/08/2014   Procedure: CORONARY ARTERY BYPASS GRAFTING (CABG);  Surgeon: Grace Isaac, MD;  Location: [place];  Service: Open Heart Surgery;  Laterality: N/A;  Times 4 using left internal mammary artery to LAD artery and endoscopically harvested bilateral saphenous vein to Obtuse Marginal, Diagonal and Posterior Descending coronary arteries.  . CT ABD W & PELVIS WO CM  06/2014   nl liver, gallbladder, spleen, mild diverticular changes, no bowel wall inflammation, appendix nl, no hernia, no other sig abnormalities  . GASTRIC ROUX-EN-Y N/A 02/09/2020   Procedure: LAPAROSCOPIC ROUX-EN-Y GASTRIC BYPASS WITH UPPER ENDOSCOPY;  Surgeon: Johnathan Hausen, MD;  Location: [facility];  Service: General;  Laterality: N/A;  . LOWER EXTREMITY ANGIOGRAPHY Left 05/23/2017   Procedure: LOWER EXTREMITY ANGIOGRAPHY;  Surgeon: Algernon Huxley, MD;  Location: [place] [facility];  Service: Cardiovascular;  Laterality: Left;  . LOWER EXTREMITY ANGIOGRAPHY Left 05/28/2017   Procedure: LOWER EXTREMITY ANGIOGRAPHY;  Surgeon: Algernon Huxley, MD;  Location: [place] [facility];  Service: Cardiovascular;  Laterality: Left;  . LOWER EXTREMITY ANGIOGRAPHY Left 12/16/2018   Procedure: LOWER EXTREMITY ANGIOGRAPHY;  Surgeon: Algernon Huxley, MD;  Location: [place] [facility];  Service: Cardiovascular;  Laterality: Left;  . LOWER EXTREMITY ANGIOGRAPHY Right 12/23/2018   Procedure: LOWER EXTREMITY ANGIOGRAPHY;  Surgeon: Algernon Huxley, MD;  Location: [place] [facility];  Service: Cardiovascular;  Laterality: Right;  . TEE WITHOUT CARDIOVERSION N/A 06/08/2014   Procedure: TRANSESOPHAGEAL ECHOCARDIOGRAM (TEE);  Surgeon: Grace Isaac, MD;  Location: [place];  Service: Open Heart Surgery;  Laterality: N/A;  . TEE WITHOUT CARDIOVERSION N/A 04/14/2020   Procedure: TRANSESOPHAGEAL ECHOCARDIOGRAM (TEE);  Surgeon: Minna Merritts, MD;  Location: [facility];  Service: Cardiovascular;  Laterality:  N/A;  . TOE AMPUTATION Right 12/2011   rt middle toe  . UPPER GI ENDOSCOPY N/A 02/09/2020   Procedure: UPPER GI ENDOSCOPY;  Surgeon: Johnathan Hausen, MD;  Location: [facility];  Service: General;  Laterality: N/A;  . US ECHOCARDIOGRAPHY  06/2014   EF 50-55%, HK of inf/post/inferolat walls, Ao sclerosis    Social History:  reports that she has quit smoking. Her smoking use included cigarettes. She has a 20.00 pack-year smoking history. She has never used smokeless tobacco. She reports previous drug use. She reports that she does not drink alcohol.   No Known Allergies  Family History  Problem Relation Age of Onset  . CAD Mother   . Hypertension Mother   . Diabetes Mother   . CAD Father   . Diabetes Father   . Sickle cell trait Other   . Asthma Other      Prior to Admission medications   Medication Sig Start Date End Date Taking? Authorizing Provider  ACCU-CHEK AVIVA PLUS test strip 1 each 3 (three) times daily. 12/31/19   [provider]  Accu-Chek Softclix Lancets lancets 1 each 3 (three) times daily. 01/13/20   [provider]  Alcohol Swabs (B-D SINGLE USE SWABS REGULAR) PADS Apply topically. 01/21/20   [provider]  amiodarone (PACERONE) 200 MG tablet TAKE 1 TABLET TWICE DAILY 07/21/20   Minna Merritts, MD  amLODipine (NORVASC) 5 MG tablet Take 1 tablet by mouth daily. 10/24/19 10/23/20  [provider]  apixaban (ELIQUIS) 5 MG TABS tablet Take 1 tablet (5 mg total) by mouth 2 (two) times daily. 05/03/20 06/02/20  Marrianne Mood D, [name]  atorvastatin (LIPITOR) 80 MG tablet Take 1 tablet (80 mg total) by mouth daily at 6 PM. 05/31/20   Gollan, Kathlene November, MD  Cholecalciferol (VITAMIN D3) 50 MCG (2000 UT) TABS Take 2,000 Units by mouth daily.    [provider]  clobetasol (TEMOVATE) 0.05 % external solution Patient to mix entire bottle with 1 jar of CeraVe and apply twice a day x 1 month then as needed. Avoid face, groin, underarms. Patient  taking differently: Apply 1 application topically See admin instructions. Patient to mix entire bottle with 1 jar of CeraVe and apply twice a day x 1 month then as needed. Avoid face, groin, underarms. 10/07/19   Brendolyn Patty, MD  clotrimazole (LOTRIMIN) 1 % cream Apply  as directed to affected area twice a day  for fungal infection 06/14/20   [provider]  docusate sodium (COLACE) 100 MG capsule Take 100 mg by mouth 2 (two) times daily.    [provider]  DROPLET PEN NEEDLES 31G X 6 MM MISC  11/10/19   [provider]  DULoxetine (CYMBALTA) 60 MG capsule Take 60 mg by mouth daily.  02/25/19   [provider]  Eflornithine HCl 13.9 % cream Apply to upper lip, chin and neck twice a day. Patient taking differently: Apply 1 application topically 2 (two) times daily. Apply to upper lip, chin and neck twice a day. 10/07/19   Brendolyn Patty, MD  Emollient (CERAVE EX) Apply 1 application topically in the morning and at bedtime.    [provider]  ferrous sulfate 325 (65 FE) MG tablet Take 325 mg by mouth daily.    [provider]  furosemide (LASIX) 20 MG tablet TAKE 2 TABLETS (40 MG TOTAL) BY MOUTH 2 (TWO) TIMES DAILY AS NEEDED (FLUID RETENTION.). 08/09/20   Minna Merritts, MD  insulin aspart (NOVOLOG) 100 UNIT/ML injection Inject 0-20 Units into the skin every 4 (four) hours. Patient taking differently: Inject 3-7 Units into the skin every 4 (four) hours. Sliding scale 02/11/20   Johnathan Hausen, MD  lidocaine (LIDODERM) 5 % Place 1 patch onto the skin daily as needed (PAIN.).  02/24/19   [provider]  LYRICA 150 MG capsule Take 1 capsule by mouth three times a day  for nerve pain 06/14/20   [provider]  metoprolol tartrate (LOPRESSOR) 50 MG tablet Take 1 tablet (50 mg total) by mouth 2 (two) times daily. 07/12/20   Minna Merritts, MD  Multiple Vitamins-Minerals (MULTIVITAMIN WITH MINERALS) tablet Take 2 tablets by mouth in  the morning and at bedtime.    [provider]  oxybutynin (DITROPAN) 5 MG tablet Take 5 mg by mouth 2 (two) times daily.  05/10/17   [provider]  OZEMPIC, 0.25 OR 0.5 MG/DOSE, 2 MG/1.5ML SOPN Inject 0.25 mg subcutaneously once a week  for 4 weeks, then increase to 0.5 mg once weekly 06/14/20   [provider]  pantoprazole (PROTONIX) 40 MG tablet Take 1 tablet (40 mg total) by mouth  daily. 07/01/20   Minna Merritts, MD  potassium chloride SA (KLOR-CON) 20 MEQ tablet TAKE 1 TABLET (20 MEQ TOTAL) BY MOUTH DAILY AS NEEDED. Patient taking differently: Take 20 mEq by mouth daily as needed. 07/23/19   Theora Gianotti, NP  tiZANidine (ZANAFLEX) 4 MG tablet Take 4 mg by mouth 3 (three) times daily. 06/14/18   [provider]    Physical Exam: BP (!) 145/65   Pulse 87   Temp (!) 100.6 F (38.1 C) (Rectal)   Resp (!) 27   Ht 5' 8.5" (1.74 m)   Wt 113 kg   SpO2 100%   BMI 37.33 kg/m   . General: 65 y.o. year-old female well developed well nourished in no acute distress.  Alert and oriented x3. . Cardiovascular: Regular rate and rhythm with no rubs or gallops.  No thyromegaly or JVD noted.  No lower extremity edema. 2/4 pulses in all 4 extremities. Marland Kitchen Respiratory: Diffuse rales bilaterally with no wheezing noted.  Poor inspiratory effort.  On BiPAP. Marland Kitchen Abdomen: Soft nontender nondistended with normal bowel sounds x4 quadrants. . Muskuloskeletal: No cyanosis or clubbing.  2+ pitting edema in lower extremities bilaterally.   . Neuro: CN II-XII intact, strength, sensation, reflexes . Skin: Left lower extremity chronic wounds noted at plantar region of great toe. Marland Kitchen Psychiatry: Judgement and insight appear normal. Mood is appropriate for condition and setting          Labs on Admission:  Basic Metabolic Panel: Recent Labs  Lab 08/11/20 1356  NA 135  K 4.7  CL 101  CO2 20*  GLUCOSE 243*  BUN 15  CREATININE 1.60*  CALCIUM 8.4*   Liver Function  Tests: Recent Labs  Lab 08/11/20 1356  AST 61*  ALT 42  ALKPHOS 121  BILITOT 2.9*  PROT 6.6  ALBUMIN 2.8*   No results for input(s): LIPASE, AMYLASE in the last 168 hours. No results for input(s): AMMONIA in the last 168 hours. CBC: Recent Labs  Lab 08/11/20 1356  WBC 12.5*  NEUTROABS 9.4*  HGB 12.1  HCT 37.6  MCV 76.4*  PLT 331   Cardiac Enzymes: No results for input(s): CKTOTAL, CKMB, CKMBINDEX, TROPONINI in the last 168 hours.  BNP (last 3 results) Recent Labs    08/11/20 1356  BNP 548.4*    ProBNP (last 3 results) No results for input(s): PROBNP in the last 8760 hours.  CBG: No results for input(s): GLUCAP in the last 168 hours.  Radiological Exams on Admission: CT Head Wo Contrast  Result Date: 08/11/2020 CLINICAL DATA:  Neck injury.  Fall. EXAM: CT HEAD WITHOUT CONTRAST CT CERVICAL SPINE WITHOUT CONTRAST TECHNIQUE: Multidetector CT imaging of the head and cervical spine was performed following the standard protocol without intravenous contrast. Multiplanar CT image reconstructions of the cervical spine were also generated. COMPARISON:  None. FINDINGS: CT HEAD FINDINGS Brain: No evidence of acute infarction, hemorrhage, hydrocephalus, extra-axial collection or mass lesion/mass effect. Vascular: No hyperdense vessel or unexpected calcification. Skull: Normal. Negative for fracture or focal lesion. Sinuses/Orbits: No acute finding. Other: None. CT CERVICAL SPINE FINDINGS Alignment: Normal. Skull base and vertebrae: No acute fracture. No primary bone lesion or focal pathologic process. Soft tissues and spinal canal: No prevertebral fluid or swelling. No visible canal hematoma. Disc levels: Moderate degenerative disc disease is noted at C3-4, C5-6 and C6-7 with anterior osteophyte formation. Other: None. IMPRESSION: Normal head CT. Moderate multilevel degenerative disc disease. No acute abnormality seen in the cervical spine. Electronically  Signed   By: Marijo Conception  M.D.   On: 08/11/2020 16:34   CT Angio Chest PE W and/or Wo Contrast  Result Date: 08/11/2020 CLINICAL DATA:  Shortness of breath EXAM: CT ANGIOGRAPHY CHEST WITH CONTRAST TECHNIQUE: Multidetector CT imaging of the chest was performed using the standard protocol during bolus administration of intravenous contrast. Multiplanar CT image reconstructions and MIPs were obtained to evaluate the vascular anatomy. CONTRAST:  88mL OMNIPAQUE IOHEXOL 350 MG/ML SOLN COMPARISON:  None. FINDINGS: Cardiovascular: Cardiomegaly. Prior CABG. Aortic atherosclerosis. No aneurysm. No filling defects in the pulmonary arteries to suggest pulmonary emboli. Mediastinum/Nodes: Borderline sized mediastinal lymph nodes diffusely as well as bilateral hilar lymph nodes, likely reactive. Trachea and esophagus are unremarkable. Thyroid unremarkable. Lungs/Pleura: Extensive bilateral airspace disease and small to moderate bilateral effusions. Findings concerning for multifocal pneumonia. Upper Abdomen: Imaging into the upper abdomen demonstrates no acute findings. Musculoskeletal: Chest wall soft tissues are unremarkable. No acute bony abnormality. Review of the MIP images confirms the above findings. IMPRESSION: Extensive bilateral airspace disease with small to moderate bilateral pleural effusions. Findings concerning for multifocal pneumonia. No evidence of pulmonary embolus. Cardiomegaly. Aortic Atherosclerosis (ICD10-I70.0). Electronically Signed   By: Rolm Baptise M.D.   On: 08/11/2020 16:36   CT Cervical Spine Wo Contrast  Result Date: 08/11/2020 CLINICAL DATA:  Neck injury.  Fall. EXAM: CT HEAD WITHOUT CONTRAST CT CERVICAL SPINE WITHOUT CONTRAST TECHNIQUE: Multidetector CT imaging of the head and cervical spine was performed following the standard protocol without intravenous contrast. Multiplanar CT image reconstructions of the cervical spine were also generated. COMPARISON:  None. FINDINGS: CT HEAD FINDINGS Brain: No evidence of  acute infarction, hemorrhage, hydrocephalus, extra-axial collection or mass lesion/mass effect. Vascular: No hyperdense vessel or unexpected calcification. Skull: Normal. Negative for fracture or focal lesion. Sinuses/Orbits: No acute finding. Other: None. CT CERVICAL SPINE FINDINGS Alignment: Normal. Skull base and vertebrae: No acute fracture. No primary bone lesion or focal pathologic process. Soft tissues and spinal canal: No prevertebral fluid or swelling. No visible canal hematoma. Disc levels: Moderate degenerative disc disease is noted at C3-4, C5-6 and C6-7 with anterior osteophyte formation. Other: None. IMPRESSION: Normal head CT. Moderate multilevel degenerative disc disease. No acute abnormality seen in the cervical spine. Electronically Signed   By: Marijo Conception M.D.   On: 08/11/2020 16:34   CT ABDOMEN PELVIS W CONTRAST  Result Date: 08/11/2020 CLINICAL DATA:  Abdominal distention EXAM: CT ABDOMEN AND PELVIS WITH CONTRAST TECHNIQUE: Multidetector CT imaging of the abdomen and pelvis was performed using the standard protocol following bolus administration of intravenous contrast. CONTRAST:  55mL OMNIPAQUE IOHEXOL 350 MG/ML SOLN COMPARISON:  09/06/2015 FINDINGS: Lower chest: Bilateral pleural effusions with bilateral lower lobe airspace disease. See further discussion on chest CT. Hepatobiliary: No focal hepatic abnormality. Gallbladder unremarkable. Pancreas: No focal abnormality or ductal dilatation. Spleen: No focal abnormality.  Normal size. Adrenals/Urinary Tract: Fullness of the adrenal glands bilaterally, likely hyperplasia. No renal mass or hydronephrosis. Urinary bladder unremarkable. Stomach/Bowel: Sigmoid diverticulosis. No active diverticulitis. Scattered right colonic diverticulosis. Stomach and small bowel decompressed, unremarkable. Prior gastric bypass. Vascular/Lymphatic: Diffuse aortoiliac atherosclerosis. No evidence of aneurysm or adenopathy. Reproductive: No mass. Other: No  free fluid or free air. Musculoskeletal: No acute bony abnormality. IMPRESSION: Bilateral pleural effusions with lower lobe airspace disease. See further discussion on chest CT report. Scattered colonic diverticulosis. No active diverticulitis. Aortoiliac atherosclerosis. No acute findings in the abdomen or pelvis. Electronically Signed   By: Rolm Baptise M.D.  On: 08/11/2020 16:38   DG Chest Portable 1 View  Result Date: 08/11/2020 CLINICAL DATA:  Short of breath.  Hypoxia. EXAM: PORTABLE CHEST 1 VIEW COMPARISON:  07/01/2018. FINDINGS: Patchy bilateral airspace lung opacities are noted, most evident peripherally and at the bases. Possible small pleural effusions.  No pneumothorax. 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 Cardiac silhouette is mildly enlarged. No convincing mediastinal or hilar masses. Skeletal structures are grossly intact. IMPRESSION: 1. Bilateral airspace lung opacities, which may reflect multifocal pneumonia. Pulmonary edema is possible, supported by possible small effusions and mild cardiomegaly. Electronically Signed   By: Lajean Manes M.D.   On: 08/11/2020 14:23    EKG: I independently viewed the EKG done and my findings are as followed: Sinus rhythm 88 nonspecific ST-T changes.  QTc 526.  Assessment/Plan Present on Admission: . Acute respiratory failure with hypoxia (HCC)  Active Problems:   Acute respiratory failure with hypoxia (HCC)  Acute hypoxic respiratory failure secondary to suspected aspiration pneumonia versus flash pulmonary edema. Acutely hypoxemic with O2 saturation in the 60s on room air. Initially in A. fib with RVR was cardioverted by EMS, converted to sinus rhythm Chest x-ray in the ED revealed bilateral pulmonary infiltrates with suspicion for aspiration versus flash pulmonary edema in the setting of A. fib with RVR. She is currently on BiPAP, continue for now and keep n.p.o. while on BiPAP.  Suspected dysphagia and aspiration pneumonia Patient has  reported having trouble swallowing her foods and liquids Keep n.p.o. until passes a swallow evaluation by speech therapy. Aspiration precautions. Started Zosyn in the ED. Monitor fever curve and WBC. Obtain sputum culture Obtain procalcitonin level in the morning.  Acute on chronic combined diastolic and systolic CHF Last 2D echo done on 03/02/2020 showed LVEF 35 to 40% and grade 1 diastolic dysfunction. Presented with volume overload elevated BNP greater than 500 Chest x-ray with evidence of pulmonary edema versus bilaterally pulmonary infiltrates from possible aspiration pneumonia. Start strict I's and O's and daily weight. Give 1 dose IV lasix 40 mg x 1 Obtain 2 D echo  AKI on CKD 3B Baseline creatinine appears to be 1.2 with GFR 48 Presented with creatinine of 1.6 with GFR 35 Avoid nephrotoxic agents Monitor urine output Repeat renal panel in the morning.  Anion gap metabolic acidosis likely in the setting of acute kidney injury Presented with serum bicarb of 20 with anion gap of 14 Monitor closely Repeat renal panel in the morning.  Hyperbilirubinemia suspect liver congestion from volume overload Presented with elevated T bilirubin 2.9 and elevated AST Alkaline phosphatase and ALT normal Avoid hepatotoxic agents.  Resolved paroxysmal A. fib with RVR Initially in A. fib with RVR, was cardioverted She is currently in sinus rhythm Resume home Lopressor when no longer n.p.o. IV Lopressor as needed with parameters. Resume home Eliquis when no longer NPO. Repeat EKG in the morning  Type 2 diabetes with hyperglycemia Obtain hemoglobin A1c Start insulin sliding scale  GERD  Resume home PPI when no longer NPO.  Chronic left lower extremity wound Follows at the wound care clinic Wound care specialist consult  Ambulatory dysfunction with frequent falls PT OT to assess Fall precautions.   DVT prophylaxis: Eliquis.  Code Status: Full code as stated by the patient  herself.  Family Communication: Discussed care plan with the patient's sister at bedside who is also her medical POA.  All questions answered to the best of my ability.  Disposition Plan: Admit to stepdown unit  Consults called: None.  Admission status: Inpatient status.  Patient will require at least 2 midnights for further evaluation and treatment of present condition.   Status is: Inpatient    Dispo: The patient is from: Home.              Anticipated d/c is to: Home.              Patient currently not stable for discharge due to ongoing treatment of acute hypoxic respiratory failure.  Suspected dysphagia with aspiration pneumonia.   Difficult to place patient, not applicable.       Kayleen Memos MD [facility] Pager [phone]  If 7PM-7AM, please contact night-coverage [email or web address] Password [facility]  08/11/2020, 6:02 PM

## 2020-08-11 NOTE — Progress Notes (Signed)
PHARMACY -  BRIEF ANTIBIOTIC NOTE   Pharmacy has received consult(s) for vancomycin and cefepime from an ED provider.  The patient's profile has been reviewed for ht/wt/allergies/indication/available labs.    One time order(s) placed for vancomycin 1 g + cefepime 2 g  Further antibiotics/pharmacy consults should be ordered by admitting physician if indicated.                       Thank you,  Tawnya Crook, PharmD 08/11/2020  2:48 PM

## 2020-08-11 NOTE — ED Provider Notes (Signed)
Patient was  [facility] Emergency Department Provider Note  ____________________________________________   Event Date/Time   First MD Initiated Contact with Patient 08/11/20 1350     (approximate)  I have reviewed the triage vital signs and the nursing notes.   HISTORY  Chief Complaint Respiratory Distress    HPI Melinda [name] is a 65 y.o. female with atrial flutter on Eliquis who comes in for concerns for heart problem.  Patient reportedly [place] very weak [place] the bus stop and fell.  She reportedly did not hit her head but pt states she did hit her head.  When EMS got there patient was noted to be hypoxic in SVT.  Patient's oxygen levels were initially 50s to 60s on room air so was placed on nonrebreather.  Patient was cardioverted by EMS and given 50 of fentanyl prior to arrival.  Upon arrival history is limited due to patient's respiratory distress placed on BiPAP.  Therefore unable to get full HPI.  However patient does state that she hit her head and does report abdominal pain.            Past Medical History:  Diagnosis Date  . Anxiety   . Atrial fibrillation ([place])   . Atrial flutter ([place])   . Coronary artery disease    a. 06/2014 NSTEMI s/p CABG x 4 (LIMA to LAD, VG to Diag, VG to OM, VG to PDA).  . Depression   . Diabetic neuropathy ([place])   . GERD (gastroesophageal reflux disease)   . HTN (hypertension)   . Hyperlipidemia LDL goal <70   . IDDM (insulin dependent diabetes mellitus)   . Ischemic cardiomyopathy    a. 06/2014 Echo: EF 40-45%, HK of entire inferolateral and inferior myocardium c/w infarct of RCA/LCx, GR2DD, mild MR  . Myocardial infarction ([place]) 2016  . OA (osteoarthritis) of knee   . Obesity   . Osteoarthritis   . Osteomyelitis ([place])   . Peripheral neuropathy   . Peripheral vascular disease ([place])    a. 09/2016 Periph Angio: CTO R popliteal, CTO L prox/mid SFA->Med Rx; b. 05/2017 PTA: LSFA (Viabahn covered stent x 2), DEB to  L Post Tibial; c. 06/2017 ABI: R 0.44, L 1.05.  . Tobacco abuse     Patient Active Problem List   Diagnosis Date Noted  . S/P gastric bypass 02/09/2020  . Severe recurrent major depression without psychotic features ([place]) 03/12/2018  . Cocaine abuse ([place]) 03/12/2018  . Suicidal ideation 03/12/2018  . Atherosclerosis of native arteries of the extremities with ulceration ([place]) 06/19/2017  . Gangrene of toe of left foot ([place]) 05/14/2017  . CAD (coronary artery disease), native coronary artery 05/05/2017  . Claudication in peripheral vascular disease ([place]) 10/18/2016  . Hyperlipidemia LDL goal <70 10/18/2016  . Onychomycosis 10/10/2016  . Encounters for administrative purpose 10/10/2016  . Amputated toe, left ([place]) 10/10/2016  . Amputated toe, right ([place]) 10/10/2016  . Osteomyelitis of left foot ([place]) 10/21/2015  . Hyperglycemia 10/02/2015  . Gastroesophageal reflux disease 11/16/2014  . Generalized ischemic myocardial dysfunction 07/07/2014  . Peripheral neuropathy   . Tobacco abuse   . Obesity   . 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 Chronic coronary artery disease 06/08/2014  . DM2 (diabetes mellitus, type 2) ([place])   . NSTEMI (non-ST elevated myocardial infarction) ([place]) 06/03/2014  . Hypertension 09/11/2012  . Diabetes mellitus type 2, insulin dependent ([place]) 09/11/2012    Past Surgical History:  Procedure Laterality Date  . ABDOMINAL AORTOGRAM  W/LOWER EXTREMITY N/A 10/27/2016   Procedure: Abdominal Aortogram w/Lower Extremity;  Surgeon: Nelva Bush, MD;  Location: [place] [facility];  Service: Cardiovascular;  Laterality: N/A;  . AMPUTATION TOE Left 10/21/2015   Procedure: AMPUTATION TOE;  Surgeon: Sharlotte Alamo, DPM;  Location: [facility];  Service: Podiatry;  Laterality: Left;  . AMPUTATION TOE Left 05/15/2017   Procedure: AMPUTATION LEFT GREAT TOE;  Surgeon: Sharlotte Alamo, DPM;  Location: [facility];  Service: Podiatry;  Laterality: Left;  . AORTIC VALVE REPLACEMENT (AVR)/CORONARY ARTERY  BYPASS GRAFTING (CABG)    . BREAST BIOPSY    . CARDIAC CATHETERIZATION  06/2014   95% stenosis mLAD, occlusion ostial OM1, 70% stenosis LCx, 95% stenosis mRCA, EF 45%.  Marland Kitchen CARDIOVERSION N/A 04/14/2020   Procedure: CARDIOVERSION with TEE;  Surgeon: Minna Merritts, MD;  Location: [facility];  Service: Cardiovascular;  Laterality: N/A;  . CARDIOVERSION N/A 05/28/2020   Procedure: CARDIOVERSION;  Surgeon: Minna Merritts, MD;  Location: [facility];  Service: Cardiovascular;  Laterality: N/A;  . CORONARY ARTERY BYPASS GRAFT N/A 06/08/2014   Procedure: CORONARY ARTERY BYPASS GRAFTING (CABG);  Surgeon: Grace Isaac, MD;  Location: [place];  Service: Open Heart Surgery;  Laterality: N/A;  Times 4 using left internal mammary artery to LAD artery and endoscopically harvested bilateral saphenous vein to Obtuse Marginal, Diagonal and Posterior Descending coronary arteries.  . CT ABD W & PELVIS WO CM  06/2014   nl liver, gallbladder, spleen, mild diverticular changes, no bowel wall inflammation, appendix nl, no hernia, no other sig abnormalities  . GASTRIC ROUX-EN-Y N/A 02/09/2020   Procedure: LAPAROSCOPIC ROUX-EN-Y GASTRIC BYPASS WITH UPPER ENDOSCOPY;  Surgeon: Johnathan Hausen, MD;  Location: [facility];  Service: General;  Laterality: N/A;  . LOWER EXTREMITY ANGIOGRAPHY Left 05/23/2017   Procedure: LOWER EXTREMITY ANGIOGRAPHY;  Surgeon: Algernon Huxley, MD;  Location: [place] [facility];  Service: Cardiovascular;  Laterality: Left;  . LOWER EXTREMITY ANGIOGRAPHY Left 05/28/2017   Procedure: LOWER EXTREMITY ANGIOGRAPHY;  Surgeon: Algernon Huxley, MD;  Location: [place] [facility];  Service: Cardiovascular;  Laterality: Left;  . LOWER EXTREMITY ANGIOGRAPHY Left 12/16/2018   Procedure: LOWER EXTREMITY ANGIOGRAPHY;  Surgeon: Algernon Huxley, MD;  Location: [place] [facility];  Service: Cardiovascular;  Laterality: Left;  . LOWER EXTREMITY ANGIOGRAPHY Right 12/23/2018   Procedure: LOWER EXTREMITY ANGIOGRAPHY;  Surgeon:  Algernon Huxley, MD;  Location: [place] [facility];  Service: Cardiovascular;  Laterality: Right;  . TEE WITHOUT CARDIOVERSION N/A 06/08/2014   Procedure: TRANSESOPHAGEAL ECHOCARDIOGRAM (TEE);  Surgeon: Grace Isaac, MD;  Location: [place];  Service: Open Heart Surgery;  Laterality: N/A;  . TEE WITHOUT CARDIOVERSION N/A 04/14/2020   Procedure: TRANSESOPHAGEAL ECHOCARDIOGRAM (TEE);  Surgeon: Minna Merritts, MD;  Location: [facility];  Service: Cardiovascular;  Laterality: N/A;  . TOE AMPUTATION Right 12/2011   rt middle toe  . UPPER GI ENDOSCOPY N/A 02/09/2020   Procedure: UPPER GI ENDOSCOPY;  Surgeon: Johnathan Hausen, MD;  [facility];  Service: General;  Laterality: N/A;  . US ECHOCARDIOGRAPHY  06/2014   EF 50-55%, HK of inf/post/inferolat walls, Ao sclerosis    Prior to Admission medications   Medication Sig Start Date End Date Taking? Authorizing Provider  ACCU-CHEK AVIVA PLUS test strip 1 each 3 (three) times daily. 12/31/19   [provider]  Accu-Chek Softclix Lancets lancets 1 each 3 (three) times daily. 01/13/20   [provider]  Alcohol Swabs (B-D SINGLE USE SWABS REGULAR) PADS  Apply topically. 01/21/20   [provider]  amiodarone (PACERONE) 200 MG tablet TAKE 1 TABLET TWICE DAILY 07/21/20   Minna Merritts, MD  amLODipine (NORVASC) 5 MG tablet Take 1 tablet by mouth daily. 10/24/19 10/23/20  [provider]  apixaban (ELIQUIS) 5 MG TABS tablet Take 1 tablet (5 mg total) by mouth 2 (two) times daily. 05/03/20 06/02/20  Marrianne Mood D, [name]  atorvastatin (LIPITOR) 80 MG tablet Take 1 tablet (80 mg total) by mouth daily at 6 PM. 05/31/20   Gollan, Kathlene November, MD  Cholecalciferol (VITAMIN D3) 50 MCG (2000 UT) TABS Take 2,000 Units by mouth daily.    [provider]  clobetasol (TEMOVATE) 0.05 % external solution Patient to mix entire bottle with 1 jar of CeraVe and apply twice a day x 1 month then as needed. Avoid face, groin,  underarms. Patient taking differently: Apply 1 application topically See admin instructions. Patient to mix entire bottle with 1 jar of CeraVe and apply twice a day x 1 month then as needed. Avoid face, groin, underarms. 10/07/19   Brendolyn Patty, MD  clotrimazole (LOTRIMIN) 1 % cream Apply  as directed to affected area twice a day  for fungal infection 06/14/20   [provider]  docusate sodium (COLACE) 100 MG capsule Take 100 mg by mouth 2 (two) times daily.    [provider]  DROPLET PEN NEEDLES 31G X 6 MM MISC  11/10/19   [provider]  DULoxetine (CYMBALTA) 60 MG capsule Take 60 mg by mouth daily.  02/25/19   [provider]  Eflornithine HCl 13.9 % cream Apply to upper lip, chin and neck twice a day. Patient taking differently: Apply 1 application topically 2 (two) times daily. Apply to upper lip, chin and neck twice a day. 10/07/19   Brendolyn Patty, MD  Emollient (CERAVE EX) Apply 1 application topically in the morning and at bedtime.    [provider]  ferrous sulfate 325 (65 FE) MG tablet Take 325 mg by mouth daily.    [provider]  furosemide (LASIX) 20 MG tablet TAKE 2 TABLETS (40 MG TOTAL) BY MOUTH 2 (TWO) TIMES DAILY AS NEEDED (FLUID RETENTION.). 08/09/20   Minna Merritts, MD  insulin aspart (NOVOLOG) 100 UNIT/ML injection Inject 0-20 Units into the skin every 4 (four) hours. Patient taking differently: Inject 3-7 Units into the skin every 4 (four) hours. Sliding scale 02/11/20   Johnathan Hausen, MD  lidocaine (LIDODERM) 5 % Place 1 patch onto the skin daily as needed (PAIN.).  02/24/19   [provider]  LYRICA 150 MG capsule Take 1 capsule by mouth three times a day  for nerve pain 06/14/20   [provider]  metoprolol tartrate (LOPRESSOR) 50 MG tablet Take 1 tablet (50 mg total) by mouth 2 (two) times daily. 07/12/20   Minna Merritts, MD  Multiple Vitamins-Minerals (MULTIVITAMIN WITH MINERALS) tablet Take 2  tablets by mouth in the morning and at bedtime.    [provider]  oxybutynin (DITROPAN) 5 MG tablet Take 5 mg by mouth 2 (two) times daily.  05/10/17   [provider]  OZEMPIC, 0.25 OR 0.5 MG/DOSE, 2 MG/1.5ML SOPN Inject 0.25 mg subcutaneously once a week  for 4 weeks, then increase to 0.5 mg once weekly 06/14/20   [provider]  pantoprazole (PROTONIX) 40 MG tablet Take 1 tablet (40 mg total) by mouth daily. 07/01/20   Minna Merritts, MD  potassium chloride  SA (KLOR-CON) 20 MEQ tablet TAKE 1 TABLET (20 MEQ TOTAL) BY MOUTH DAILY AS NEEDED. Patient taking differently: Take 20 mEq by mouth daily as needed. 07/23/19   Theora Gianotti, NP  tiZANidine (ZANAFLEX) 4 MG tablet Take 4 mg by mouth 3 (three) times daily. 06/14/18   [provider]    Allergies Patient has no known allergies.  Family History  Problem Relation Age of Onset  . CAD Mother   . Hypertension Mother   . Diabetes Mother   . CAD Father   . Diabetes Father   . Sickle cell trait Other   . Asthma Other     Social History Social History   Tobacco Use  . Smoking status: Former Smoker    Packs/day: 0.50    Years: 40.00    Pack years: 20.00    Types: Cigarettes  . Smokeless tobacco: Never Used  . Tobacco comment: started Chantix 04/12/20  Vaping Use  . Vaping Use: Never used  Substance Use Topics  . Alcohol use: No    Alcohol/week: 0.0 standard drinks  . Drug use: Not Currently    Comment: crack      Review of Systems Review of systems is limited due to patient's respiratory status.  _________________________   PHYSICAL EXAM:  VITAL SIGNS: Blood pressure (!) 142/53, pulse 86, temperature (!) 100.6 F (38.1 C), temperature source Rectal, resp. rate (!) 21, height 5' 8.5" (1.74 m), weight 113 kg, SpO2 100 %.   Constitutional: Acute distress with increased work of breathing Eyes: Conjunctivae are normal. EOMI. Head: Atraumatic. Nose: No  congestion/rhinnorhea. Mouth/Throat: Mucous membranes are moist.   Neck: No stridor. Trachea Midline. FROM Cardiovascular: Normal rate, regular rhythm. Grossly normal heart sounds.  Good peripheral circulation. Respiratory: Crackles bilaterally, increased work of breathing Gastrointestinal: Soft and nontender. No distention. No abdominal bruits.  Musculoskeletal: 1+ edema bilaterally no joint effusions. Neurologic:  Normal speech and language. No gross focal neurologic deficits are appreciated.  Skin:  Skin is warm, dry and intact. No rash noted. Psychiatric: Unable to assess due to respiratory status GU: Deferred   ____________________________________________   LABS (all labs ordered are listed, but only abnormal results are displayed)  Labs Reviewed  CBC WITH DIFFERENTIAL/PLATELET - Abnormal; Notable for the following components:      Result Value   WBC 12.5 (*)    MCV 76.4 (*)    MCH 24.6 (*)    RDW 24.1 (*)    nRBC 0.6 (*)    Neutro Abs 9.4 (*)    Monocytes Absolute 1.3 (*)    Abs Immature Granulocytes 0.12 (*)    All other components within normal limits  COMPREHENSIVE METABOLIC PANEL - Abnormal; Notable for the following components:   CO2 20 (*)    Glucose, Bld 243 (*)    Creatinine, Ser 1.60 (*)    Calcium 8.4 (*)    Albumin 2.8 (*)    AST 61 (*)    Total Bilirubin 2.9 (*)    GFR, Estimated 35 (*)    All other components within normal limits  LACTIC ACID, PLASMA - Abnormal; Notable for the following components:   Lactic Acid, Venous 7.6 (*)    All other components within normal limits  URINALYSIS, COMPLETE (UACMP) WITH MICROSCOPIC - Abnormal; Notable for the following components:   Color, Urine YELLOW (*)    APPearance CLEAR (*)    Ketones, ur 5 (*)    Protein, ur 30 (*)    All other  components within normal limits  BLOOD GAS, VENOUS - Abnormal; Notable for the following components:   Acid-base deficit 6.6 (*)    All other components within normal limits   RESP PANEL BY RT-PCR (FLU A&B, COVID) ARPGX2  CULTURE, BLOOD (ROUTINE X 2)  CULTURE, BLOOD (ROUTINE X 2)  PROCALCITONIN  BRAIN NATRIURETIC PEPTIDE  LACTIC ACID, PLASMA  TROPONIN I (HIGH SENSITIVITY)  TROPONIN I (HIGH SENSITIVITY)   ____________________________________________   ED ECG REPORT I, Vanessa [place], the attending physician, personally viewed and interpreted this ECG.  Sinus rate of 88, no ST elevation, occasional PVC with type I AV block, T wave inversions and V5 to V6 ____________________________________________  RADIOLOGY I, Vanessa [place], personally viewed and evaluated these images (plain radiographs) as part of my medical decision making, as well as reviewing the written report by the radiologist.  ED MD interpretation: Bilateral opacifications.  Official radiology report(s): DG Chest Portable 1 View  Result Date: 08/11/2020 CLINICAL DATA:  Short of breath.  Hypoxia. EXAM: PORTABLE CHEST 1 VIEW COMPARISON:  07/01/2018. FINDINGS: Patchy bilateral airspace lung opacities are noted, most evident peripherally and at the bases. Possible small pleural effusions.  No pneumothorax. 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 Cardiac silhouette is mildly enlarged. No convincing mediastinal or hilar masses. Skeletal structures are grossly intact. IMPRESSION: 1. Bilateral airspace lung opacities, which may reflect multifocal pneumonia. Pulmonary edema is possible, supported by possible small effusions and mild cardiomegaly. Electronically Signed   By: Lajean Manes M.D.   On: 08/11/2020 14:23    ____________________________________________   PROCEDURES  Procedure(s) performed (including Critical Care):  .Critical Care Performed by: Vanessa [place], MD Authorized by: Vanessa [place], MD   Critical care provider statement:    Critical care time (minutes):  45   Critical care was necessary to treat or prevent imminent or life-threatening deterioration of the following conditions:   Respiratory failure   Critical care was time spent personally by me on the following activities:  Discussions with consultants, evaluation of patient's response to treatment, examination of patient, ordering and performing treatments and interventions, ordering and review of laboratory studies, ordering and review of radiographic studies, pulse oximetry, re-evaluation of patient's condition, obtaining history from patient or surrogate and review of old charts .1-3 Lead EKG Interpretation Performed by: Vanessa [place], MD Authorized by: Vanessa [place], MD     Interpretation: normal     ECG rate:  80s   ECG rate assessment: normal     Rhythm: sinus rhythm     Ectopy: none     Conduction: normal       ____________________________________________   INITIAL IMPRESSION / ASSESSMENT AND PLAN / ED COURSE   Luane Rochon was evaluated in Emergency Department on 08/11/2020 for the symptoms described in the history of present illness. She was evaluated in the context of the global COVID-19 pandemic, which necessitated consideration that the patient might be at risk for infection with the SARS-CoV-2 virus that causes COVID-19. Institutional protocols and algorithms that pertain to the evaluation of patients at risk for COVID-19 are in a state of rapid change based on information released by regulatory bodies including the CDC and federal and state organizations. These policies and algorithms were followed during the patient's care in the ED.    Patient comes in acutely ill with respiratory distress and increased work of breathing with crackles and lower extremity edema.  I was concerned about the possibility of fluid overload or flash pulmonary  edema secondary to atrial flutter that patient was initially in with EMS.  Patient has been cardioverted and is now in normal sinus after EMS.  Patient will be started on BiPAP.  Labs are ordered to evaluate for Electra abnormalities, AKI.  Chest x-ray resulted  with concern for edema versus pneumonia patient was febrile.  Therefore I was concerned about the possibility of infection.  Her lactate is significantly elevated but is difficult to tell if this is secondary to sepsis versus cardiac.  I do not think she has a PE given she has been on Eliquis but I am also hesitant to give her a ton of fluids given the concern for fluid overload the pulmonary edema.  We will trial 500 cc bolus and repeat lactate and keep patient on BiPAP.  We will start some broad-spectrum antibiotics.  Likely patient's procalcitonin is negative which seems to make sepsis less likely.  Patient will need to be admitted to the hospital team after CT imaging.  Given she does have a lot of tenderness in her abdomen will get CTs to evaluate for intercranial hemorrhage, acute abdominal process, PE.  Patient was handed off to oncoming team pending CTs, fluid bolus challenge, continue patient on BiPAP and admission.  Patient has high chance for decompensation due to her being acutely ill    ____________________________________________   FINAL CLINICAL IMPRESSION(S) / ED DIAGNOSES   Final diagnoses:  Respiratory distress  Acute respiratory failure with hypoxia (HCC)     MEDICATIONS GIVEN DURING THIS VISIT:  Medications  acetaminophen (TYLENOL) tablet 1,000 mg (has no administration in time range)  ceFEPIme (MAXIPIME) 2 g in sodium chloride 0.9 % 100 mL IVPB (2 g Intravenous New Bag/Given 08/11/20 1525)  metroNIDAZOLE (FLAGYL) IVPB 500 mg (500 mg Intravenous New Bag/Given 08/11/20 1528)  vancomycin (VANCOCIN) IVPB 1000 mg/200 mL premix (has no administration in time range)  sodium chloride 0.9 % bolus 500 mL (500 mLs Intravenous New Bag/Given 08/11/20 1525)     ED Discharge Orders    None       Note:  This document was prepared using Dragon voice recognition software and may include unintentional dictation errors.   Vanessa [place], MD 08/11/20 1539

## 2020-08-11 NOTE — ED Notes (Addendum)
Pt unable to sign esignature for medical screening at this time due respiratory distress and decreased responsiveness.

## 2020-08-11 NOTE — ED Provider Notes (Signed)
-----------------------------------------   3:21 PM on 08/11/2020 -----------------------------------------  Blood pressure (!) 142/53, pulse 86, temperature (!) 100.6 F (38.1 C), temperature source Rectal, resp. rate (!) 21, height 5' 8.5" (1.74 m), weight 113 kg, SpO2 100 %.  Assuming care from Dr. Jari Pigg.  In short, Melinda [name] is a 65 y.o. female with a chief complaint of Respiratory Distress .  Refer to the original H&P for additional details.  The current plan of care is to follow-up CT imaging and reassess respiratory status with patient currently on BiPap.  ----------------------------------------- 5:06 PM on 08/11/2020 -----------------------------------------  CT head and C-spine are negative for acute traumatic injury.  CTA chest is negative for PE but does show multifocal opacities representing pneumonia versus pulmonary edema.  I would favor pulmonary edema given her negative procalcitonin and negative testing for COVID-19.  Lactic acid is improving following a small amount of IV fluids, given concern for pulmonary edema we will diurese with IV Lasix.  On reassessment, patient reports feeling much better on BiPAP and she remains in normal sinus rhythm.  Case discussed with hospitalist for admission.    Blake Divine, MD 08/11/20 [id number]

## 2020-08-11 NOTE — Progress Notes (Signed)
Pharmacy Antibiotic Note  Melinda [name] is a 65 y.o. female with atrial flutter on Eliquis, GERD, depression, and HLD who came in complaining of respiratory distress. Patient received one time doses of cefepime, metronidazole, and vancomycin in the ED. Pharmacy has been consulted for Zosyn dosing for aspiration pneumonia. WBC 12.5, lactic acid 7.6>2.8, 24hTmax 100.68F.   4/13 CXR: Bilateral airspace lung opacities, which may reflect multifocal pneumonia 4/13 CT chest with findings concerning for multifocal pneumonia  Plan: Zosyn 3.375g IV q8h (4 hour infusion)  Monitor renal function and adjust dose as clinically indicated  Height: 5' 8.5" (174 cm) Weight: 113 kg (249 lb 1.9 oz) IBW/kg (Calculated) : 65.05  Temp (24hrs), Avg:100.6 F (38.1 C), Min:100.6 F (38.1 C), Max:100.6 F (38.1 C)  Recent Labs  Lab 08/11/20 1356 08/11/20 1602  WBC 12.5*  --   CREATININE 1.60*  --   LATICACIDVEN 7.6* 2.8*    Estimated Creatinine Clearance: 46 mL/min (A) (by C-G formula based on SCr of 1.6 mg/dL (H)).    No Known Allergies  Antimicrobials this admission: 4/13 Cefepime, metronidazole, vancomycin x1 4/13 Zosyn >>  Dose adjustments this admission:  Microbiology results: 4/13 BCx: pending  Thank you for allowing pharmacy to be a part of this patient's care.  Sherilyn Banker, PharmD Pharmacy Resident  08/11/2020 6:29 PM

## 2020-08-11 NOTE — ED Triage Notes (Signed)
pt arrives via ems from home, pace bus pt fell, pt only complaint to ems was SOB . EMS reports pt hypoxic, in SVT. pt arrived on nonrebreather with saturation in low 90's.  initial sat was 50-60% RA when ems arrived to pthx of afib. ems reports. ems cardiovert once during transport. Pt given 12mcg fentanyl prior to arrival. MD present to assess  Ems vitals 104/70 HR 160

## 2020-08-12 ENCOUNTER — Ambulatory Visit: Payer: Medicare HMO | Admitting: Physician Assistant

## 2020-08-12 ENCOUNTER — Inpatient Hospital Stay (HOSPITAL_COMMUNITY)
Admit: 2020-08-12 | Discharge: 2020-08-12 | Disposition: A | Discharge status: Home / Self Care | Payer: Medicare HMO | Attending: Internal Medicine | Admitting: Internal Medicine

## 2020-08-12 DIAGNOSIS — I5021 Acute systolic (congestive) heart failure: Secondary | ICD-10-CM

## 2020-08-12 DIAGNOSIS — R0603 Acute respiratory distress: Secondary | ICD-10-CM

## 2020-08-12 LAB — CBC WITH DIFFERENTIAL/PLATELET
Abs Immature Granulocytes: 0.08 10*3/uL — ABNORMAL HIGH (ref 0.00–0.07)
Basophils Absolute: 0 10*3/uL (ref 0.0–0.1)
Basophils Relative: 0 %
Eosinophils Absolute: 0 10*3/uL (ref 0.0–0.5)
Eosinophils Relative: 0 %
HCT: 39.4 % (ref 36.0–46.0)
Hemoglobin: 13.6 g/dL (ref 12.0–15.0)
Immature Granulocytes: 1 %
Lymphocytes Relative: 5 %
Lymphs Abs: 0.6 10*3/uL — ABNORMAL LOW (ref 0.7–4.0)
MCH: 25.6 pg — ABNORMAL LOW (ref 26.0–34.0)
MCHC: 34.5 g/dL (ref 30.0–36.0)
MCV: 74.1 fL — ABNORMAL LOW (ref 80.0–100.0)
Monocytes Absolute: 1.2 10*3/uL — ABNORMAL HIGH (ref 0.1–1.0)
Monocytes Relative: 9 %
Neutro Abs: 11.1 10*3/uL — ABNORMAL HIGH (ref 1.7–7.7)
Neutrophils Relative %: 85 %
Platelets: 331 10*3/uL (ref 150–400)
RBC: 5.32 MIL/uL — ABNORMAL HIGH (ref 3.87–5.11)
RDW: 23.2 % — ABNORMAL HIGH (ref 11.5–15.5)
Smear Review: NORMAL
WBC: 13.1 10*3/uL — ABNORMAL HIGH (ref 4.0–10.5)
nRBC: 0.2 % (ref 0.0–0.2)

## 2020-08-12 LAB — PHOSPHORUS: Phosphorus: 3.4 mg/dL (ref 2.5–4.6)

## 2020-08-12 LAB — MAGNESIUM: Magnesium: 1.8 mg/dL (ref 1.7–2.4)

## 2020-08-12 LAB — ECHOCARDIOGRAM COMPLETE
AR max vel: 3.18 cm2
AV Area VTI: 3.08 cm2
AV Area mean vel: 2.94 cm2
AV Mean grad: 4 mmHg
AV Peak grad: 6 mmHg
Ao pk vel: 1.22 m/s
Area-P 1/2: 4.85 cm2
Calc EF: 42.6 %
Height: 68.5 in
MV VTI: 3.06 cm2
S' Lateral: 4.8 cm
Single Plane A2C EF: 45.2 %
Single Plane A4C EF: 40.1 %
Weight: 3985.92 oz

## 2020-08-12 LAB — COMPREHENSIVE METABOLIC PANEL
ALT: 61 U/L — ABNORMAL HIGH (ref 0–44)
AST: 77 U/L — ABNORMAL HIGH (ref 15–41)
Albumin: 3 g/dL — ABNORMAL LOW (ref 3.5–5.0)
Alkaline Phosphatase: 136 U/L — ABNORMAL HIGH (ref 38–126)
Anion gap: 11 (ref 5–15)
BUN: 16 mg/dL (ref 8–23)
CO2: 28 mmol/L (ref 22–32)
Calcium: 8.9 mg/dL (ref 8.9–10.3)
Chloride: 101 mmol/L (ref 98–111)
Creatinine, Ser: 1.4 mg/dL — ABNORMAL HIGH (ref 0.44–1.00)
GFR, Estimated: 41 mL/min — ABNORMAL LOW (ref >60)
Glucose, Bld: 166 mg/dL — ABNORMAL HIGH (ref 70–99)
Potassium: 3.5 mmol/L (ref 3.5–5.1)
Sodium: 140 mmol/L (ref 135–145)
Total Bilirubin: 3.1 mg/dL — ABNORMAL HIGH (ref 0.3–1.2)
Total Protein: 7.1 g/dL (ref 6.5–8.1)

## 2020-08-12 LAB — GLUCOSE, CAPILLARY
Glucose-Capillary: 168 mg/dL — ABNORMAL HIGH (ref 70–99)
Glucose-Capillary: 119 mg/dL — ABNORMAL HIGH (ref 70–99)
Glucose-Capillary: 184 mg/dL — ABNORMAL HIGH (ref 70–99)

## 2020-08-12 LAB — PROCALCITONIN: Procalcitonin: 0.74 ng/mL

## 2020-08-12 LAB — LACTIC ACID, PLASMA: Lactic Acid, Venous: 1.8 mmol/L (ref 0.5–1.9)

## 2020-08-12 MED ORDER — METOPROLOL TARTRATE 25 MG PO TABS
25.0000 mg | ORAL_TABLET | Freq: Two times a day (BID) | ORAL | Status: DC
  Administered 2020-08-12 – 2020-08-15 (×8): 25 mg via ORAL
  Filled 2020-08-12 (×8): qty 1

## 2020-08-12 MED ORDER — FUROSEMIDE 10 MG/ML IJ SOLN
40.0000 mg | Freq: Two times a day (BID) | INTRAMUSCULAR | Status: AC
  Administered 2020-08-12 (×2): 40 mg via INTRAVENOUS
  Filled 2020-08-12 (×2): qty 4

## 2020-08-12 NOTE — Plan of Care (Signed)
Pt tolerated [place] oxygen well all day since 0900 am, PT walked with pt and pt seat up in chair for almost 5 hours, eating and drinking well, tolerating new diet well. Pt putting out good urine output and stated she is breathing much better. Family has being at bedside all day. Pt has denies any pain. VSS. Care plan reviewed with pt and  caregiver. Uneventful day. Pt is progressing toward goals.

## 2020-08-12 NOTE — Evaluation (Signed)
Occupational Therapy Evaluation Patient Details Name: Melinda [name] MRN: [id number] DOB: Aug 05, 1955 Today's Date: 08/12/2020    History of Present Illness Pt is a 65 y.o. female with medical history significant for chronic left foot wound, type 2 diabetes, atrial flutter on Eliquis, coronary artery disease status post CABG in 2016, GERD, essential hypertension, hyperlipidemia, obesity, severe peripheral artery disease who presented to [facility] ED after falling on the bus.  When EMS arrived she was in A. fib with RVR and was having trouble breathing with oxygen saturation in the 60s on room air.  She received 2 shocks by EMS and was cardioverted back to sinus rhythm.   Clinical Impression   Melinda [name] was seen for OT evaluation this date. Prior to hospital admission, pt was Independent for mobility and I/ADLs. Pt lives alone, sister at bedside states pt can likely stay with her upon d/c. Pt presents to acute OT demonstrating impaired ADL performance and functional mobility 2/2 decreased safety awareness, functional strength/balance deficits, and poor activity tolerance. Upon arrival pt seated in chair, sister at bedside. Sister provided cues t/o session for safe t/f techniques. Initial sit<>stand pt became "swimmy headed" and required MIN A for eccentric control. Improved following several trials to CGA sit<>stand with and without RW. Pt currently requires CGA for BSC t/f, anticipate MIN A for perihygiene/clothin gmgmt in standing - assist for balance. Pt would benefit from skilled OT to address noted impairments and functional limitations (see below for any additional details) in order to maximize safety and independence while minimizing falls risk and caregiver burden. Upon hospital discharge, recommend HHOT to maximize pt safety and return to functional independence during meaningful occupations of daily life.     Follow Up Recommendations  Home health OT;Supervision - Intermittent    Equipment  Recommendations  3 in 1 bedside commode    Recommendations for Other Services       Precautions / Restrictions Precautions Precautions: Fall Restrictions Weight Bearing Restrictions: No      Mobility Bed Mobility Overal bed mobility: Needs Assistance     General bed mobility comments: pt received and left in chair    Transfers Overall transfer level: Needs assistance Equipment used: Rolling walker (2 wheeled) Transfers: Sit to/from [facility] Sit to Stand: Min guard Stand pivot transfers: Min guard       General transfer comment: initial sit<>stand pt became "swimmy headed" and required MIN A for eccentric control. Improved following several trials to CGA for SPT    Balance Overall balance assessment: Needs assistance Sitting-balance support: Feet supported Sitting balance-Leahy Scale: Good Sitting balance - Comments: able to sit EOB for several long minutes to wash face, mouth swab, and for sister to braid her hair   Standing balance support: No upper extremity supported Standing balance-Leahy Scale: Fair                             ADL either performed or assessed with clinical judgement   ADL Overall ADL's : Needs assistance/impaired                                       General ADL Comments: CGA for BSC t/f, anticipate MIN A for perihygiene/clothin gmgmt in standing, assist for balance.     Vision Baseline Vision/History: No visual deficits  Pertinent Vitals/Pain Pain Assessment: No/denies pain     Hand Dominance Right   Extremity/Trunk Assessment Upper Extremity Assessment Upper Extremity Assessment: Overall WFL for tasks assessed   Lower Extremity Assessment Lower Extremity Assessment: Generalized weakness   Cervical / Trunk Assessment Cervical / Trunk Assessment: Normal   Communication Communication Communication: No difficulties   Cognition Arousal/Alertness:  Awake/alert Behavior During Therapy: WFL for tasks assessed/performed Overall Cognitive Status: Within Functional Limits for tasks assessed                                 General Comments: slow responses, engaged in conversation   General Comments  Seated: BP 100/60, Standing: BP 113/77    Exercises Exercises: Other exercises Other Exercises Other Exercises: Pt and sister educated re: OT role, DME recs, d/c recs, falls prevention, ECS Other Exercises: sit<>stand x5, sitting/standing balance/tolerance, BSC t/f   Shoulder Instructions      Home Living Family/patient expects to be discharged to:: Private residence Living Arrangements: Alone Available Help at Discharge: Friend(s);Family Type of Home: House Home Access: Stairs to enter [employer] of Steps: 5-6 Entrance Stairs-Rails: Right;Left Home Layout: One level     Bathroom Shower/Tub: Tub/shower unit;Walk-in shower   Bathroom Toilet: Standard Bathroom Accessibility: Yes   Home Equipment: Cane - quad;Shower seat;Grab bars - tub/shower          Prior Functioning/Environment Level of Independence: Independent with assistive device(s)        Comments: uses quad cane for ambulation. has had two falls in the last 6 months, stated her legs give out on her        OT Problem List: Decreased strength;Decreased activity tolerance;Impaired balance (sitting and/or standing);Decreased safety awareness;Decreased knowledge of use of DME or AE      OT Treatment/Interventions: Self-care/ADL training;Therapeutic exercise;Energy conservation;DME and/or AE instruction;Therapeutic activities;Patient/family education;Balance training    OT Goals(Current goals can be found in the care plan section) Acute Rehab OT Goals Patient Stated Goal: to go home OT Goal Formulation: With patient/family Time For Goal Achievement: 08/26/20 Potential to Achieve Goals: Good ADL Goals Pt Will Perform Grooming: with  modified independence;standing (c LRAD PRN) Pt Will Perform Lower Body Dressing: with modified independence;sit to/from stand (c LRAD PRN) Pt Will Transfer to Toilet: with modified independence;ambulating;regular height toilet (c LRAD PRN)  OT Frequency: Min 2X/week    AM-PAC OT "6 Clicks" Daily Activity     Outcome Measure Help from another person eating meals?: None Help from another person taking care of personal grooming?: A Little Help from another person toileting, which includes using toliet, bedpan, or urinal?: A Little Help from another person bathing (including washing, rinsing, drying)?: A Little Help from another person to put on and taking off regular upper body clothing?: A Little Help from another person to put on and taking off regular lower body clothing?: A Little 6 Click Score: 19   End of Session Nurse Communication: Mobility status  Activity Tolerance: Patient tolerated treatment well Patient left: in chair;with call bell/phone within reach;with nursing/sitter in room;with family/visitor present  OT Visit Diagnosis: Unsteadiness on feet (R26.81);Other abnormalities of gait and mobility (R26.89)                Time: 0867-6195 OT Time Calculation (min): 27 min Charges:  OT General Charges $OT Visit: 1 Visit OT Evaluation $OT Eval Moderate Complexity: 1 Mod OT Treatments $Self Care/Home Management : 23-37 mins   Felicia Bloomquist  Spero Curb, M.S. OTR/L  08/12/20, 1:47 PM  ascom [phone]

## 2020-08-12 NOTE — Consult Note (Addendum)
[place] Nurse Consult Note: Reason for Consult: Consult requested for left foot wound. Pt is followed by the outpatient wound care center prior to admission according to progress notes. Left plantar great toe with chronic full thickness wound; .3X.3X.3cm, red and moist, small amt tan drainage, no odor or fluctuance.  Left inner foot with pink dry scar tissue from previous wound which has healed. Right inner plantar foot with dry darker-colored callous; 3X3cm raised above skin level, no open wound, drainage, or fluctuance.  No topical treatment is indicated for this site. Dressing procedure/placement/frequency: Topical treatment orders provided for bedside nurses to perform as follows: Cut piece of alginate Kellie Simmering # [phone]) and tuck into left plantar toe wound Q day, using swab to fill.  Cover with foam dressing.  Moisten with NS each time to remove previous dressing. (Change foam dressing Q 3 days or PRN soiling.) Pt should resume follow-up with the outpatient wound care center after discharge. Please re-consult if further assistance is needed.  Thank-you,  Julien Girt MSN, [place], [place], [place], [place]

## 2020-08-12 NOTE — Evaluation (Signed)
Clinical/Bedside Swallow Evaluation Patient Details  Name: Melinda [name] MRN: [id number] Date of Birth: February 13, 1956  Today's Date: 08/12/2020 Time: SLP Start Time (ACUTE ONLY): 34 SLP Stop Time (ACUTE ONLY): 1119 SLP Time Calculation (min) (ACUTE ONLY): 29 min  Past Medical History:  Past Medical History:  Diagnosis Date  . Anxiety   . Atrial fibrillation ([place])   . Atrial flutter ([place])   . Coronary artery disease    a. 06/2014 NSTEMI s/p CABG x 4 (LIMA to LAD, VG to Diag, VG to OM, VG to PDA).  . Depression   . Diabetic neuropathy ([place])   . GERD (gastroesophageal reflux disease)   . HTN (hypertension)   . Hyperlipidemia LDL goal <70   . IDDM (insulin dependent diabetes mellitus)   . Ischemic cardiomyopathy    a. 06/2014 Echo: EF 40-45%, HK of entire inferolateral and inferior myocardium c/w infarct of RCA/LCx, GR2DD, mild MR  . Myocardial infarction ([place]) 2016  . OA (osteoarthritis) of knee   . Obesity   . Osteoarthritis   . Osteomyelitis ([place])   . Peripheral neuropathy   . Peripheral vascular disease ([place])    a. 09/2016 Periph Angio: CTO R popliteal, CTO L prox/mid SFA->Med Rx; b. 05/2017 PTA: LSFA (Viabahn covered stent x 2), DEB to L Post Tibial; c. 06/2017 ABI: R 0.44, L 1.05.  . Tobacco abuse    Past Surgical History:  Past Surgical History:  Procedure Laterality Date  . ABDOMINAL AORTOGRAM W/LOWER EXTREMITY N/A 10/27/2016   Procedure: Abdominal Aortogram w/Lower Extremity;  Surgeon: Nelva Bush, MD;  Location: [place] [facility];  Service: Cardiovascular;  Laterality: N/A;  . AMPUTATION TOE Left 10/21/2015   Procedure: AMPUTATION TOE;  Surgeon: Sharlotte Alamo, DPM;  Location: [facility];  Service: Podiatry;  Laterality: Left;  . AMPUTATION TOE Left 05/15/2017   Procedure: AMPUTATION LEFT GREAT TOE;  Surgeon: Sharlotte Alamo, DPM;  Location: [facility];  Service: Podiatry;  Laterality: Left;  . AORTIC VALVE REPLACEMENT (AVR)/CORONARY ARTERY BYPASS GRAFTING (CABG)    . BREAST  BIOPSY    . CARDIAC CATHETERIZATION  06/2014   95% stenosis mLAD, occlusion ostial OM1, 70% stenosis LCx, 95% stenosis mRCA, EF 45%.  Marland Kitchen CARDIOVERSION N/A 04/14/2020   Procedure: CARDIOVERSION with TEE;  Surgeon: Minna Merritts, MD;  Location: [facility];  Service: Cardiovascular;  Laterality: N/A;  . CARDIOVERSION N/A 05/28/2020   Procedure: CARDIOVERSION;  Surgeon: Minna Merritts, MD;  Location: [facility];  Service: Cardiovascular;  Laterality: N/A;  . CORONARY ARTERY BYPASS GRAFT N/A 06/08/2014   Procedure: CORONARY ARTERY BYPASS GRAFTING (CABG);  Surgeon: Grace Isaac, MD;  Location: [place];  Service: Open Heart Surgery;  Laterality: N/A;  Times 4 using left internal mammary artery to LAD artery and endoscopically harvested bilateral saphenous vein to Obtuse Marginal, Diagonal and Posterior Descending coronary arteries.  . CT ABD W & PELVIS WO CM  06/2014   nl liver, gallbladder, spleen, mild diverticular changes, no bowel wall inflammation, appendix nl, no hernia, no other sig abnormalities  . GASTRIC ROUX-EN-Y N/A 02/09/2020   Procedure: LAPAROSCOPIC ROUX-EN-Y GASTRIC BYPASS WITH UPPER ENDOSCOPY;  Surgeon: Johnathan Hausen, MD;  Location: [facility];  Service: General;  Laterality: N/A;  . LOWER EXTREMITY ANGIOGRAPHY Left 05/23/2017   Procedure: LOWER EXTREMITY ANGIOGRAPHY;  Surgeon: Algernon Huxley, MD;  Location: [place] [facility];  Service: Cardiovascular;  Laterality: Left;  . LOWER EXTREMITY ANGIOGRAPHY Left 05/28/2017   Procedure: LOWER EXTREMITY ANGIOGRAPHY;  Surgeon: Algernon Huxley,  MD;  Location: [place] [facility];  Service: Cardiovascular;  Laterality: Left;  . LOWER EXTREMITY ANGIOGRAPHY Left 12/16/2018   Procedure: LOWER EXTREMITY ANGIOGRAPHY;  Surgeon: Algernon Huxley, MD;  Location: [place] [facility];  Service: Cardiovascular;  Laterality: Left;  . LOWER EXTREMITY ANGIOGRAPHY Right 12/23/2018   Procedure: LOWER EXTREMITY ANGIOGRAPHY;  Surgeon: Algernon Huxley, MD;  Location: [place] [facility];  Service: Cardiovascular;  Laterality: Right;  . TEE WITHOUT CARDIOVERSION N/A 06/08/2014   Procedure: TRANSESOPHAGEAL ECHOCARDIOGRAM (TEE);  Surgeon: Grace Isaac, MD;  Location: [place];  Service: Open Heart Surgery;  Laterality: N/A;  . TEE WITHOUT CARDIOVERSION N/A 04/14/2020   Procedure: TRANSESOPHAGEAL ECHOCARDIOGRAM (TEE);  Surgeon: Minna Merritts, MD;  Location: [facility];  Service: Cardiovascular;  Laterality: N/A;  . TOE AMPUTATION Right 12/2011   rt middle toe  . UPPER GI ENDOSCOPY N/A 02/09/2020   Procedure: UPPER GI ENDOSCOPY;  Surgeon: Johnathan Hausen, MD;  Location: [facility];  Service: General;  Laterality: N/A;  . US ECHOCARDIOGRAPHY  06/2014   EF 50-55%, HK of inf/post/inferolat walls, Ao sclerosis   HPI:  Melinda [name] is a 65 y.o. female with medical history significant for chronic left foot wound, type 2 diabetes, atrial flutter on Eliquis, coronary artery disease status post CABG in 2016, GERD, essential hypertension, hyperlipidemia, obesity, severe peripheral artery disease who presented to [facility] ED after falling on the bus.  When EMS arrived she was in A. fib with RVR and was having trouble breathing with oxygen saturation in the 60s on room air.  She received 2 shocks by EMS and was cardioverted back to sinus rhythm. She reports choking on her food when she eats, also intermittent nausea with dry heaving for the past 3 months. Chest x-ray in the ED revealed bilateral pulmonary infiltrates with suspicion for aspiration versus flash.   Assessment / Plan / Recommendation Clinical Impression  Melinda [name] stated, "I get choked on my medicine and food gets tuck" (pointing to her chest). Her symptoms aren't related to any specific food items. During this bedside swallow evaluation, Melinda [name] demonstrated adequate oropharyngeal abilities when consuming thin liquids via straw, puree and solids. Melinda [name] has dentures but they are in her sister's car. Melinda [name] had slightly prolonged mastication with solid dry  graham crackers. When consuming thin liquids via straw, Melinda [name]'s swallow appeared swift and was free of overt s/s of aspiration. While silent aspiration cannot be ruled out at bedside, Melinda [name] appears at reduced risk of aspiration when consuming regular, puree and thin liquids via straw. At this time, recommend dysphagia 3 diet d/t dentures not present, thin liquids via cup or straw and medicine whole with puree. Education provided to Melinda [name] and her sister to consume medicine whole with puree to decrease possible s/s of choking. Recommend possible esophageal assessment. SLP Visit Diagnosis: Dysphagia, unspecified (R13.10)    Aspiration Risk  Mild aspiration risk    Diet Recommendation Dysphagia 3 (Mech soft);Thin liquid   Liquid Administration via: Straw;Cup Medication Administration: Whole meds with puree Supervision: Patient able to self feed Compensations: Minimize environmental distractions;Slow rate;Small sips/bites Postural Changes: Seated upright at 90 degrees;Remain upright for at least 30 minutes after po intake    Other  Recommendations Recommended Consults: Consider esophageal assessment Oral Care Recommendations: Oral care BID   Follow up Recommendations None      Frequency and Duration            Prognosis        Swallow Study  General Date of Onset: 08/11/20 HPI: Melinda [name] is a 65 y.o. female with medical history significant for chronic left foot wound, type 2 diabetes, atrial flutter on Eliquis, coronary artery disease status post CABG in 2016, GERD, essential hypertension, hyperlipidemia, obesity, severe peripheral artery disease who presented to [facility] ED after falling on the bus.  When EMS arrived she was in A. fib with RVR and was having trouble breathing with oxygen saturation in the 60s on room air.  She received 2 shocks by EMS and was cardioverted back to sinus rhythm. She reports choking on her food when she eats, also intermittent nausea with dry heaving for the past 3 months.  Chest x-ray in the ED revealed bilateral pulmonary infiltrates with suspicion for aspiration versus flash. Type of Study: Bedside Swallow Evaluation Previous Swallow Assessment: none in chart Diet Prior to this Study: NPO Temperature Spikes Noted: No Respiratory Status: Room air History of Recent Intubation: No Behavior/Cognition: Alert;Cooperative;Pleasant mood Oral Cavity Assessment: Within Functional Limits Oral Care Completed by SLP: Recent completion by staff Oral Cavity - Dentition: Missing dentition Vision: Functional for self-feeding Self-Feeding Abilities: Able to feed self Patient Positioning: Upright in chair Baseline Vocal Quality: Normal Volitional Cough: Strong Volitional Swallow: Able to elicit    Oral/Motor/Sensory Function Overall Oral Motor/Sensory Function: Within functional limits   Ice Chips Ice chips: Not tested   Thin Liquid Thin Liquid: Within functional limits Presentation: Self Fed;Straw    Nectar Thick Nectar Thick Liquid: Not tested   Honey Thick Honey Thick Liquid: Not tested   Puree Puree: Within functional limits Presentation: Self Fed;Spoon   Solid     Solid: Within functional limits Presentation: Self Fed     Dacotah Cabello B. Rutherford Nail M.S., CCC-SLP, [place] [facility] [place] 08/12/2020,11:19 AM

## 2020-08-12 NOTE — Progress Notes (Signed)
PROGRESS NOTE    Melinda [name]  EXH:[id number] DOB: 1955-06-02 DOA: 08/11/2020 PCP: Donnie Coffin, MD  Brief Narrative:64/F with history of COPD, CAD/CABG in 2016, atrial flutter/fibrillation on Eliquis, type 2 diabetes mellitus, ongoing tobacco abuse, PAD with chronic left foot wound, hypertension, dyslipidemia, obesity was brought to the ED after a fall on the bus, patient reports shortness of breath and cough for a few days prior, long history of choking on her food when she eats along with intermittent nausea and dry heaves, and EMS arrived she was hypoxic with A. fib RVR and subsequently cardioverted to sinus rhythm, in the ED she was placed on BiPAP for acute respiratory acidosis, chest x-ray noted bilateral pulmonary infiltrates, she was febrile to 100.6 and CT chest noted bilateral effusions and airspace disease   Assessment & Plan:   Acute hypoxic respiratory failure Aspiration pneumonia Acute on chronic combined systolic and diastolic CHF -Clinically improving, off BiPAP this morning -Continue IV Zosyn today, follow-up blood and sputum cultures -SLP evaluation -Continue IV Lasix 1 more day  Acute on chronic systolic and diastolic CHF -Last echo 6 months ago with EF of 40-45% and grade 1 diastolic dysfunction -Has bilateral pleural effusions on imaging which could be parapneumonic as well, given additional dose of Lasix today and follow-up echo  AKI on CKD 3a -Baseline creatinine around 1.2 -Monitor with diuresis  Abnormal LFTs -Could be passive hepatic congestion, will also check right upper quadrant ultrasound, exam is benign  Paroxysmal atrial fibrillation -Resume Eliquis and Lopressor  Type 2 diabetes mellitus with hyperglycemia -Continue sliding scale insulin, follow-up hemoglobin A1c  Chronic left foot wound -Does not appear overtly infected at this time, follows at the wound clinic  Ambulatory dysfunction, frequent falls -PT OT eval   DVT prophylaxis:  Eliquis Code Status: Full code Family Communication: Discussed with patient and sister at bedside Disposition Plan:  Status is: Inpatient  Remains inpatient appropriate because:Inpatient level of care appropriate due to severity of illness   Dispo:  Patient From: Home  Planned Disposition: Home  Medically stable for discharge: No    Consultants:      Procedures:   Antimicrobials:    Subjective: -Off BiPAP this morning, sister at bedside, starting to wake up  Objective: Vitals:   08/12/20 0800 08/12/20 0900 08/12/20 1000 08/12/20 1100  BP: (!) 141/67 (!) 130/50 126/67 126/71  Pulse: 95 96 92 91  Resp: (!) 21 18 17 19   Temp: 98.78 F (37.1 C) 98.78 F (37.1 C) 99.14 F (37.3 C) 99.14 F (37.3 C)  TempSrc:      SpO2: 92% 93% 98% 100%  Weight:      Height:        Intake/Output Summary (Last 24 hours) at 08/12/2020 1144 Last data filed at 08/12/2020 1053 Gross per 24 hour  Intake 348.25 ml  Output 6350 ml  Net -6001.75 ml   Filed Weights   08/11/20 1355  Weight: 113 kg    Examination:  General exam: Obese chronically ill female sitting up in bed, somnolent but easily arousable, oriented to self and place, HEENT: Neck obese unable to assess JVD CVS: S1-S2, regular rate rhythm Lungs: Few scattered rhonchi especially at the bases, otherwise clear Abdomen: Soft, obese, nontender, bowel sounds present Extremities: Left foot with small superficial wound on the plantar aspect of her great toe Psychiatry: Flat affect    Data Reviewed:   CBC: Recent Labs  Lab 08/11/20 1356 08/12/20 0411  WBC 12.5* 13.1*  NEUTROABS  9.4* 11.1*  HGB 12.1 13.6  HCT 37.6 39.4  MCV 76.4* 74.1*  PLT 331 371   Basic Metabolic Panel: Recent Labs  Lab 08/11/20 1356 08/12/20 0411  NA 135 140  K 4.7 3.5  CL 101 101  CO2 20* 28  GLUCOSE 243* 166*  BUN 15 16  CREATININE 1.60* 1.40*  CALCIUM 8.4* 8.9  MG  --  1.8  PHOS  --  3.4   GFR: Estimated Creatinine Clearance:  52.6 mL/min (A) (by C-G formula based on SCr of 1.4 mg/dL (H)). Liver Function Tests: Recent Labs  Lab 08/11/20 1356 08/12/20 0411  AST 61* 77*  ALT 42 61*  ALKPHOS 121 136*  BILITOT 2.9* 3.1*  PROT 6.6 7.1  ALBUMIN 2.8* 3.0*   No results for input(s): LIPASE, AMYLASE in the last 168 hours. No results for input(s): AMMONIA in the last 168 hours. Coagulation Profile: No results for input(s): INR, PROTIME in the last 168 hours. Cardiac Enzymes: No results for input(s): CKTOTAL, CKMB, CKMBINDEX, TROPONINI in the last 168 hours. BNP (last 3 results) No results for input(s): PROBNP in the last 8760 hours. HbA1C: No results for input(s): HGBA1C in the last 72 hours. CBG: Recent Labs  Lab 08/11/20 2139 08/12/20 0719 08/12/20 1112  GLUCAP 171* 168* 119*   Lipid Profile: No results for input(s): CHOL, HDL, LDLCALC, TRIG, CHOLHDL, LDLDIRECT in the last 72 hours. Thyroid Function Tests: No results for input(s): TSH, T4TOTAL, FREET4, T3FREE, THYROIDAB in the last 72 hours. Anemia Panel: No results for input(s): VITAMINB12, FOLATE, FERRITIN, TIBC, IRON, RETICCTPCT in the last 72 hours. Urine analysis:    Component Value Date/Time   COLORURINE YELLOW (A) 08/11/2020 1356   APPEARANCEUR CLEAR (A) 08/11/2020 1356   APPEARANCEUR Clear 10/10/2016 1911   LABSPEC 1.011 08/11/2020 1356   LABSPEC 1.031 06/01/2014 2258   PHURINE 6.0 08/11/2020 1356   GLUCOSEU NEGATIVE 08/11/2020 1356   GLUCOSEU 50 mg/dL 06/01/2014 2258   HGBUR NEGATIVE 08/11/2020 [place] 08/11/2020 1356   BILIRUBINUR Negative 10/10/2016 1911   BILIRUBINUR Negative 06/01/2014 2258   KETONESUR 5 (A) 08/11/2020 1356   PROTEINUR 30 (A) 08/11/2020 1356   UROBILINOGEN 1.0 06/07/2014 0952   NITRITE NEGATIVE 08/11/2020 [place] 08/11/2020 1356   LEUKOCYTESUR Negative 06/01/2014 2258   Sepsis Labs: @LABRCNTIP (procalcitonin:4,lacticidven:4)  ) Recent Results (from the past 240  hour(s))  Blood culture (routine x 2)     Status: None (Preliminary result)   Collection Time: 08/11/20  1:56 PM   Specimen: BLOOD  Result Value Ref Range Status   Specimen Description BLOOD LEFT ANTECUBITAL  Final   Special Requests   Final    BOTTLES DRAWN AEROBIC AND ANAEROBIC Blood Culture adequate volume   Culture   Final    NO GROWTH < 24 HOURS Performed at [facility], [address] [place],  [address]    Report Status PENDING  Incomplete  Resp Panel by RT-PCR (Flu A&B, Covid) Nasopharyngeal Swab     Status: None   Collection Time: 08/11/20  1:56 PM   Specimen: Nasopharyngeal Swab; Nasopharyngeal(NP) swabs in vial transport medium  Result Value Ref Range Status   SARS Coronavirus 2 by RT PCR NEGATIVE NEGATIVE Final    Comment: (NOTE) SARS-CoV-2 target nucleic acids are NOT DETECTED.  The SARS-CoV-2 RNA is generally detectable in upper respiratory specimens during the acute phase of infection. The lowest concentration of SARS-CoV-2 viral copies this assay can detect is 138 copies/mL.  A negative result does not preclude SARS-Cov-2 infection and should not be used as the sole basis for treatment or other patient management decisions. A negative result may occur with  improper specimen collection/handling, submission of specimen other than nasopharyngeal swab, presence of viral mutation(s) within the areas targeted by this assay, and inadequate number of viral copies(<138 copies/mL). A negative result must be combined with clinical observations, patient history, and epidemiological information. The expected result is Negative.  Fact Sheet for Patients:  EntrepreneurPulse.com.au  Fact Sheet for Healthcare Providers:  IncredibleEmployment.be  This test is no t yet approved or cleared by the [place] FDA and  has been authorized for detection and/or diagnosis of SARS-CoV-2 by FDA under an Emergency Use Authorization  (EUA). This EUA will remain  in effect (meaning this test can be used) for the duration of the COVID-19 declaration under Section 564(b)(1) of the Act, 21 U.S.C.section 360bbb-3(b)(1), unless the authorization is terminated  or revoked sooner.       Influenza A by PCR NEGATIVE NEGATIVE Final   Influenza B by PCR NEGATIVE NEGATIVE Final    Comment: (NOTE) The Xpert Xpress SARS-CoV-2/FLU/RSV plus assay is intended as an aid in the diagnosis of influenza from Nasopharyngeal swab specimens and should not be used as a sole basis for treatment. Nasal washings and aspirates are unacceptable for Xpert Xpress SARS-CoV-2/FLU/RSV testing.  Fact Sheet for Patients: EntrepreneurPulse.com.au  Fact Sheet for Healthcare Providers: IncredibleEmployment.be  This test is not yet approved or cleared by the [place] FDA and has been authorized for detection and/or diagnosis of SARS-CoV-2 by FDA under an Emergency Use Authorization (EUA). This EUA will remain in effect (meaning this test can be used) for the duration of the COVID-19 declaration under Section 564(b)(1) of the Act, 21 U.S.C. section 360bbb-3(b)(1), unless the authorization is terminated or revoked.  Performed at [facility], [address] [place], [place] [address]   Blood culture (routine x 2)     Status: None (Preliminary result)   Collection Time: 08/11/20  1:57 PM   Specimen: BLOOD  Result Value Ref Range Status   Specimen Description BLOOD RIGHT ANTECUBITAL  Final   Special Requests   Final    BOTTLES DRAWN AEROBIC AND ANAEROBIC Blood Culture results may not be optimal due to an excessive volume of blood received in culture bottles   Culture   Final    NO GROWTH < 24 HOURS Performed at [facility], [address] [place], [place] [address]    Report Status PENDING  Incomplete  MRSA PCR Screening     Status: None   Collection Time: 08/11/20  9:52 PM    Specimen: Nasopharyngeal  Result Value Ref Range Status   MRSA by PCR NEGATIVE NEGATIVE Final    Comment:        The GeneXpert MRSA Assay (FDA approved for NASAL specimens only), is one component of a comprehensive MRSA colonization surveillance program. It is not intended to diagnose MRSA infection nor to guide or monitor treatment for MRSA infections. Performed at [facility], [address], [place],  [address]          Radiology Studies: CT Head Wo Contrast  Result Date: 08/11/2020 CLINICAL DATA:  Neck injury.  Fall. EXAM: CT HEAD WITHOUT CONTRAST CT CERVICAL SPINE WITHOUT CONTRAST TECHNIQUE: Multidetector CT imaging of the head and cervical spine was performed following the standard protocol without intravenous contrast. Multiplanar CT image reconstructions of the cervical spine were also  generated. COMPARISON:  None. FINDINGS: CT HEAD FINDINGS Brain: No evidence of acute infarction, hemorrhage, hydrocephalus, extra-axial collection or mass lesion/mass effect. Vascular: No hyperdense vessel or unexpected calcification. Skull: Normal. Negative for fracture or focal lesion. Sinuses/Orbits: No acute finding. Other: None. CT CERVICAL SPINE FINDINGS Alignment: Normal. Skull base and vertebrae: No acute fracture. No primary bone lesion or focal pathologic process. Soft tissues and spinal canal: No prevertebral fluid or swelling. No visible canal hematoma. Disc levels: Moderate degenerative disc disease is noted at C3-4, C5-6 and C6-7 with anterior osteophyte formation. Other: None. IMPRESSION: Normal head CT. Moderate multilevel degenerative disc disease. No acute abnormality seen in the cervical spine. Electronically Signed   By: Marijo Conception M.D.   On: 08/11/2020 16:34   CT Angio Chest PE W and/or Wo Contrast  Result Date: 08/11/2020 CLINICAL DATA:  Shortness of breath EXAM: CT ANGIOGRAPHY CHEST WITH CONTRAST TECHNIQUE: Multidetector CT imaging of the chest was  performed using the standard protocol during bolus administration of intravenous contrast. Multiplanar CT image reconstructions and MIPs were obtained to evaluate the vascular anatomy. CONTRAST:  57mL OMNIPAQUE IOHEXOL 350 MG/ML SOLN COMPARISON:  None. FINDINGS: Cardiovascular: Cardiomegaly. Prior CABG. Aortic atherosclerosis. No aneurysm. No filling defects in the pulmonary arteries to suggest pulmonary emboli. Mediastinum/Nodes: Borderline sized mediastinal lymph nodes diffusely as well as bilateral hilar lymph nodes, likely reactive. Trachea and esophagus are unremarkable. Thyroid unremarkable. Lungs/Pleura: Extensive bilateral airspace disease and small to moderate bilateral effusions. Findings concerning for multifocal pneumonia. Upper Abdomen: Imaging into the upper abdomen demonstrates no acute findings. Musculoskeletal: Chest wall soft tissues are unremarkable. No acute bony abnormality. Review of the MIP images confirms the above findings. IMPRESSION: Extensive bilateral airspace disease with small to moderate bilateral pleural effusions. Findings concerning for multifocal pneumonia. No evidence of pulmonary embolus. Cardiomegaly. Aortic Atherosclerosis (ICD10-I70.0). Electronically Signed   By: Rolm Baptise M.D.   On: 08/11/2020 16:36   CT Cervical Spine Wo Contrast  Result Date: 08/11/2020 CLINICAL DATA:  Neck injury.  Fall. EXAM: CT HEAD WITHOUT CONTRAST CT CERVICAL SPINE WITHOUT CONTRAST TECHNIQUE: Multidetector CT imaging of the head and cervical spine was performed following the standard protocol without intravenous contrast. Multiplanar CT image reconstructions of the cervical spine were also generated. COMPARISON:  None. FINDINGS: CT HEAD FINDINGS Brain: No evidence of acute infarction, hemorrhage, hydrocephalus, extra-axial collection or mass lesion/mass effect. Vascular: No hyperdense vessel or unexpected calcification. Skull: Normal. Negative for fracture or focal lesion. Sinuses/Orbits: No  acute finding. Other: None. CT CERVICAL SPINE FINDINGS Alignment: Normal. Skull base and vertebrae: No acute fracture. No primary bone lesion or focal pathologic process. Soft tissues and spinal canal: No prevertebral fluid or swelling. No visible canal hematoma. Disc levels: Moderate degenerative disc disease is noted at C3-4, C5-6 and C6-7 with anterior osteophyte formation. Other: None. IMPRESSION: Normal head CT. Moderate multilevel degenerative disc disease. No acute abnormality seen in the cervical spine. Electronically Signed   By: Marijo Conception M.D.   On: 08/11/2020 16:34   CT ABDOMEN PELVIS W CONTRAST  Result Date: 08/11/2020 CLINICAL DATA:  Abdominal distention EXAM: CT ABDOMEN AND PELVIS WITH CONTRAST TECHNIQUE: Multidetector CT imaging of the abdomen and pelvis was performed using the standard protocol following bolus administration of intravenous contrast. CONTRAST:  60mL OMNIPAQUE IOHEXOL 350 MG/ML SOLN COMPARISON:  09/06/2015 FINDINGS: Lower chest: Bilateral pleural effusions with bilateral lower lobe airspace disease. See further discussion on chest CT. Hepatobiliary: No focal hepatic abnormality. Gallbladder unremarkable. Pancreas: No focal abnormality  or ductal dilatation. Spleen: No focal abnormality.  Normal size. Adrenals/Urinary Tract: Fullness of the adrenal glands bilaterally, likely hyperplasia. No renal mass or hydronephrosis. Urinary bladder unremarkable. Stomach/Bowel: Sigmoid diverticulosis. No active diverticulitis. Scattered right colonic diverticulosis. Stomach and small bowel decompressed, unremarkable. Prior gastric bypass. Vascular/Lymphatic: Diffuse aortoiliac atherosclerosis. No evidence of aneurysm or adenopathy. Reproductive: No mass. Other: No free fluid or free air. Musculoskeletal: No acute bony abnormality. IMPRESSION: Bilateral pleural effusions with lower lobe airspace disease. See further discussion on chest CT report. Scattered colonic diverticulosis. No active  diverticulitis. Aortoiliac atherosclerosis. No acute findings in the abdomen or pelvis. Electronically Signed   By: Rolm Baptise M.D.   On: 08/11/2020 16:38   DG Chest Portable 1 View  Result Date: 08/11/2020 CLINICAL DATA:  Short of breath.  Hypoxia. EXAM: PORTABLE CHEST 1 VIEW COMPARISON:  07/01/2018. FINDINGS: Patchy bilateral airspace lung opacities are noted, most evident peripherally and at the bases. Possible small pleural effusions.  No pneumothorax. 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 Cardiac silhouette is mildly enlarged. No convincing mediastinal or hilar masses. Skeletal structures are grossly intact. IMPRESSION: 1. Bilateral airspace lung opacities, which may reflect multifocal pneumonia. Pulmonary edema is possible, supported by possible small effusions and mild cardiomegaly. Electronically Signed   By: Lajean Manes M.D.   On: 08/11/2020 14:23   ECHOCARDIOGRAM COMPLETE  Result Date: 08/12/2020    ECHOCARDIOGRAM REPORT   Patient Name:   [facility] Date of Exam: 08/12/2020 Medical Rec #:  [id number]          Height:       68.5 in Accession #:    [id number]         Weight:       249.1 lb Date of Birth:  1955-12-16          BSA:          2.256 m Patient Age:    1 years           BP:           141/67 mmHg Patient Gender: F                  HR:           95 bpm. Exam Location:  [facility] Procedure: 2D Echo, Color Doppler, Cardiac Doppler and Strain Analysis Indications:     E17.40 CHF-Acute Systolic  History:         Patient has prior history of Echocardiogram examinations, most                  recent 04/14/2020. Ischemic Cardiomyopathy, Previous Myocardial                  Infarction and CAD, Prior CABG, PVD; Risk Factors:Current                  Smoker, Hypertension, Diabetes and Dyslipidemia.  Sonographer:     Charmayne Sheer RDCS (AE) Referring Phys:  [id number] [place] Diagnosing Phys: Ida Rogue MD  Sonographer Comments: Global longitudinal strain was attempted. IMPRESSIONS  1. Left  ventricular ejection fraction, by estimation, is 30 to 35%. The left ventricle has moderately decreased function. The left ventricle demonstrates global hypokinesis. Left ventricular diastolic parameters are indeterminate. The average left ventricular global longitudinal strain is -12.5 %. The global longitudinal strain is abnormal.  2. Right ventricular systolic function is normal. The right ventricular size is normal. There is normal pulmonary artery systolic pressure.  3.  Left atrial size was moderately dilated.  4. The mitral valve is normal in structure. Mild mitral valve regurgitation. FINDINGS  Left Ventricle: Left ventricular ejection fraction, by estimation, is 30 to 35%. The left ventricle has moderately decreased function. The left ventricle demonstrates global hypokinesis. The average left ventricular global longitudinal strain is -12.5 %. The global longitudinal strain is abnormal. The left ventricular internal cavity size was normal in size. There is no left ventricular hypertrophy. Left ventricular diastolic parameters are indeterminate. Right Ventricle: The right ventricular size is normal. No increase in right ventricular wall thickness. Right ventricular systolic function is normal. There is normal pulmonary artery systolic pressure. The tricuspid regurgitant velocity is 2.06 m/s, and  with an assumed right atrial pressure of 5 mmHg, the estimated right ventricular systolic pressure is 99.3 mmHg. Left Atrium: Left atrial size was moderately dilated. Right Atrium: Right atrial size was normal in size. Pericardium: There is no evidence of pericardial effusion. Mitral Valve: The mitral valve is normal in structure. Mild mitral valve regurgitation. No evidence of mitral valve stenosis. MV peak gradient, 6.5 mmHg. The mean mitral valve gradient is 4.0 mmHg. Tricuspid Valve: The tricuspid valve is normal in structure. Tricuspid valve regurgitation is mild . No evidence of tricuspid stenosis. Aortic  Valve: The aortic valve is normal in structure. Aortic valve regurgitation is not visualized. Mild aortic valve sclerosis is present, with no evidence of aortic valve stenosis. Aortic valve mean gradient measures 4.0 mmHg. Aortic valve peak gradient measures 6.0 mmHg. Aortic valve area, by VTI measures 3.08 cm. Pulmonic Valve: The pulmonic valve was normal in structure. Pulmonic valve regurgitation is not visualized. No evidence of pulmonic stenosis. Aorta: The aortic root is normal in size and structure. Venous: The inferior vena cava is normal in size with greater than 50% respiratory variability, suggesting right atrial pressure of 3 mmHg. IAS/Shunts: No atrial level shunt detected by color flow Doppler.  LEFT VENTRICLE PLAX 2D LVIDd:         5.50 cm      Diastology LVIDs:         4.80 cm      LV e' medial:    8.16 cm/s LV PW:         1.10 cm      LV E/e' medial:  15.6 LV IVS:        1.10 cm      LV e' lateral:   9.36 cm/s LVOT diam:     2.30 cm      LV E/e' lateral: 13.6 LV SV:         69 LV SV Index:   31           2D Longitudinal Strain LVOT Area:     4.15 cm     2D Strain GLS Avg:     -12.5 %  LV Volumes (MOD) LV vol d, MOD A2C: 161.0 ml LV vol d, MOD A4C: 134.0 ml LV vol s, MOD A2C: 88.2 ml LV vol s, MOD A4C: 80.2 ml LV SV MOD A2C:     72.8 ml LV SV MOD A4C:     134.0 ml LV SV MOD BP:      63.1 ml RIGHT VENTRICLE RV Basal diam:  3.70 cm LEFT ATRIUM             Index       RIGHT ATRIUM           Index LA diam:  4.80 cm 2.13 cm/m  RA Area:     18.10 cm LA Vol (A2C):   81.2 ml 35.99 ml/m RA Volume:   48.20 ml  21.37 ml/m LA Vol (A4C):   81.0 ml 35.91 ml/m LA Biplane Vol: 84.1 ml 37.28 ml/m  AORTIC VALVE                   PULMONIC VALVE AV Area (Vmax):    3.18 cm    PV Vmax:       1.08 m/s AV Area (Vmean):   2.94 cm    PV Vmean:      78.400 cm/s AV Area (VTI):     3.08 cm    PV VTI:        0.209 m AV Vmax:           122.00 cm/s PV Peak grad:  4.7 mmHg AV Vmean:          91.900 cm/s PV Mean grad:   3.0 mmHg AV VTI:            0.224 m AV Peak Grad:      6.0 mmHg AV Mean Grad:      4.0 mmHg LVOT Vmax:         93.30 cm/s LVOT Vmean:        65.000 cm/s LVOT VTI:          0.166 m LVOT/AV VTI ratio: 0.74  AORTA Ao Root diam: 3.30 cm MITRAL VALVE                TRICUSPID VALVE MV Area (PHT): 4.85 cm     TR Peak grad:   17.0 mmHg MV Area VTI:   3.06 cm     TR Vmax:        206.00 cm/s MV Peak grad:  6.5 mmHg MV Mean grad:  4.0 mmHg     SHUNTS MV Vmax:       1.27 m/s     Systemic VTI:  0.17 m MV Vmean:      94.5 cm/s    Systemic Diam: 2.30 cm MV Decel Time: 157 msec MV E velocity: 127.00 cm/s Ida Rogue MD Electronically signed by Ida Rogue MD Signature Date/Time: 08/12/2020/10:32:58 AM    Final         Scheduled Meds: . acetaminophen  1,000 mg Oral Once  . apixaban  5 mg Oral BID  . chlorhexidine  15 mL Mouth Rinse BID  . Chlorhexidine Gluconate Cloth  6 each Topical Daily  . DULoxetine  60 mg Oral Daily  . ferrous sulfate  325 mg Oral Daily  . furosemide  40 mg Intravenous Q12H  . mouth rinse  15 mL Mouth Rinse q12n4p  . metoprolol tartrate  25 mg Oral BID  . oxybutynin  5 mg Oral BID  . pantoprazole  40 mg Oral Daily   Continuous Infusions: . piperacillin-tazobactam (ZOSYN)  IV Stopped (08/12/20 0930)     LOS: 1 day    Time spent: 81min    Domenic Polite, MD [facility]  08/12/2020, 11:44 AM

## 2020-08-12 NOTE — Progress Notes (Signed)
*  PRELIMINARY RESULTS* Echocardiogram 2D Echocardiogram has been performed.  Melinda [name] 08/12/2020, 8:56 AM

## 2020-08-12 NOTE — Evaluation (Signed)
Physical Therapy Evaluation Patient Details Name: Melinda [name] MRN: [id number] DOB: 1955/09/06 Today's Date: 08/12/2020   History of Present Illness  Pt is a 65 y.o. female with medical history significant for chronic left foot wound, type 2 diabetes, atrial flutter on Eliquis, coronary artery disease status post CABG in 2016, GERD, essential hypertension, hyperlipidemia, obesity, severe peripheral artery disease who presented to [facility] ED after falling on the bus.  When EMS arrived she was in A. fib with RVR and was having trouble breathing with oxygen saturation in the 60s on room air.  She received 2 shocks by EMS and was cardioverted back to sinus rhythm.    Clinical Impression  Pt easily woken at start of session, family in room, [employer]. The patient provided clear PLOF; independent at baseline, lives alone, uses quad cane for ambulation, independent in ADLs/IADLs including medication management.  The patient demonstrated supine to sit with HOB elevated and minA. Pt able to sit with good balance for extended time to allow for ADLs; sister braided pt's hair, washed face, etc. Sit <> stand with CGA and RW, pt did endorse some lightheadedness but was able to take several steps to recliner in room and sit with CGA. Vitals including BP WFLs. Pt on 6L with mobility, on 4.5L at rest and RN notified of patient status. Pt declined further mobility at this time due to feeling mildly overwhelmed. Overall the patient demonstrated deficits (see "PT Problem List") that impede the patient's functional abilities, safety, and mobility and would benefit from skilled PT intervention. Recommendation is HHPT with intermittent supervision for OOB/mobility pending further progress with mobility.      Follow Up Recommendations Home health PT;Supervision for mobility/OOB;Supervision - Intermittent    Equipment Recommendations  Rolling walker with 5" wheels    Recommendations for Other Services        Precautions / Restrictions Precautions Precautions: Fall Restrictions Weight Bearing Restrictions: No      Mobility  Bed Mobility Overal bed mobility: Needs Assistance Bed Mobility: Supine to Sit     Supine to sit: Min guard;HOB elevated;Min assist     General bed mobility comments: use of bed rails, minA for handheld assist    Transfers Overall transfer level: Needs assistance Equipment used: Rolling walker (2 wheeled) Transfers: Sit to/from Stand Sit to Stand: Min guard         General transfer comment: cued for hand placement  Ambulation/Gait Ambulation/Gait assistance: Min guard Gait Distance (Feet): 2 Feet Assistive device: Rolling walker (2 wheeled)       General Gait Details: pt endorsed some lightheadedness upon standing, but able to take several steps to recliner in room. Declined further mobility at this time stating she has a lot going on.  Stairs            Wheelchair Mobility    Modified Rankin (Stroke Patients Only)       Balance Overall balance assessment: Needs assistance Sitting-balance support: Feet supported Sitting balance-Leahy Scale: Good Sitting balance - Comments: able to sit EOB for several long minutes to wash face, mouth swab, and for sister to braid her hair     Standing balance-Leahy Scale: Fair                               Pertinent Vitals/Pain Pain Assessment: No/denies pain    Home Living Family/patient expects to be discharged to:: Private residence Living Arrangements: Alone Available Help at Discharge:  Friend(s);Family (some family to assiste PRN) Type of Home: House Home Access: Stairs to enter Entrance Stairs-Rails: Psychiatric nurse of Steps: 5-6 Home Layout: One level Home Equipment: Cane - quad;Shower seat;Grab bars - tub/shower      Prior Function Level of Independence: Independent with assistive device(s)         Comments: uses quad cane for ambulation. has had  two falls in the last 6 months, stated her legs give out on her     Hand Dominance   Dominant Hand: Right    Extremity/Trunk Assessment   Upper Extremity Assessment Upper Extremity Assessment: Overall WFL for tasks assessed    Lower Extremity Assessment Lower Extremity Assessment: Generalized weakness    Cervical / Trunk Assessment Cervical / Trunk Assessment: Normal  Communication   Communication: No difficulties  Cognition Arousal/Alertness: Awake/alert Behavior During Therapy: WFL for tasks assessed/performed Overall Cognitive Status: Within Functional Limits for tasks assessed                                 General Comments: generally sleepy but over time woke more with participation      General Comments      Exercises     Assessment/Plan    PT Assessment Patient needs continued PT services  PT Problem List Decreased strength;Decreased activity tolerance;Decreased mobility;Decreased balance       PT Treatment Interventions DME instruction;Balance training;Gait training;Neuromuscular re-education;Stair training;Functional mobility training;Patient/family education;Therapeutic activities;Therapeutic exercise    PT Goals (Current goals can be found in the Care Plan section)  Acute Rehab PT Goals Patient Stated Goal: to go home PT Goal Formulation: With patient Time For Goal Achievement: 08/26/20 Potential to Achieve Goals: Good    Frequency Min 2X/week   Barriers to discharge Decreased caregiver support      Co-evaluation               AM-PAC PT "6 Clicks" Mobility  Outcome Measure Help needed turning from your back to your side while in a flat bed without using bedrails?: A Little Help needed moving from lying on your back to sitting on the side of a flat bed without using bedrails?: A Little Help needed moving to and from a bed to a chair (including a wheelchair)?: A Little Help needed standing up from a chair using your arms  (e.g., wheelchair or bedside chair)?: A Little Help needed to walk in hospital room?: A Little Help needed climbing 3-5 steps with a railing? : A Lot 6 Click Score: 17    End of Session Equipment Utilized During Treatment: Gait belt Activity Tolerance: Patient tolerated treatment well Patient left: in chair;with call bell/phone within reach;with family/visitor present Nurse Communication: Mobility status PT Visit Diagnosis: Muscle weakness (generalized) (M62.81);Difficulty in walking, not elsewhere classified (R26.2);Other abnormalities of gait and mobility (R26.89)    Time: 2248-2500 PT Time Calculation (min) (ACUTE ONLY): 39 min   Charges:   PT Evaluation $PT Eval Low Complexity: 1 Low PT Treatments $Therapeutic Exercise: 8-22 mins $Therapeutic Activity: 8-22 mins       Lieutenant Diego PT, DPT 10:49 AM,08/12/20

## 2020-08-12 NOTE — Progress Notes (Signed)
Pt switched to HFNC due to desat in mid 80s

## 2020-08-13 ENCOUNTER — Ambulatory Visit: Payer: Medicare HMO | Admitting: Dietician

## 2020-08-13 LAB — GLUCOSE, CAPILLARY
Glucose-Capillary: 186 mg/dL — ABNORMAL HIGH (ref 70–99)
Glucose-Capillary: 199 mg/dL — ABNORMAL HIGH (ref 70–99)

## 2020-08-13 LAB — BASIC METABOLIC PANEL
Anion gap: 13 (ref 5–15)
BUN: 16 mg/dL (ref 8–23)
CO2: 34 mmol/L — ABNORMAL HIGH (ref 22–32)
Calcium: 8.7 mg/dL — ABNORMAL LOW (ref 8.9–10.3)
Chloride: 97 mmol/L — ABNORMAL LOW (ref 98–111)
Creatinine, Ser: 1.49 mg/dL — ABNORMAL HIGH (ref 0.44–1.00)
GFR, Estimated: 39 mL/min — ABNORMAL LOW (ref >60)
Glucose, Bld: 162 mg/dL — ABNORMAL HIGH (ref 70–99)
Potassium: 3 mmol/L — ABNORMAL LOW (ref 3.5–5.1)
Sodium: 144 mmol/L (ref 135–145)

## 2020-08-13 LAB — CBC
HCT: 36.9 % (ref 36.0–46.0)
Hemoglobin: 12.7 g/dL (ref 12.0–15.0)
MCH: 25.4 pg — ABNORMAL LOW (ref 26.0–34.0)
MCHC: 34.4 g/dL (ref 30.0–36.0)
MCV: 73.8 fL — ABNORMAL LOW (ref 80.0–100.0)
Platelets: 306 10*3/uL (ref 150–400)
RBC: 5 MIL/uL (ref 3.87–5.11)
RDW: 23.1 % — ABNORMAL HIGH (ref 11.5–15.5)
WBC: 9.7 10*3/uL (ref 4.0–10.5)
nRBC: 0.2 % (ref 0.0–0.2)

## 2020-08-13 MED ORDER — FUROSEMIDE 40 MG PO TABS
40.0000 mg | ORAL_TABLET | Freq: Every day | ORAL | Status: DC
  Administered 2020-08-14 – 2020-08-15 (×2): 40 mg via ORAL
  Filled 2020-08-13 (×2): qty 1

## 2020-08-13 MED ORDER — AMOXICILLIN-POT CLAVULANATE 875-125 MG PO TABS
1.0000 | ORAL_TABLET | Freq: Two times a day (BID) | ORAL | Status: DC
  Administered 2020-08-13 – 2020-08-15 (×6): 1 via ORAL
  Filled 2020-08-13 (×8): qty 1

## 2020-08-13 MED ORDER — POTASSIUM CHLORIDE CRYS ER 20 MEQ PO TBCR
40.0000 meq | EXTENDED_RELEASE_TABLET | Freq: Once | ORAL | Status: AC
  Administered 2020-08-13: 40 meq via ORAL
  Filled 2020-08-13: qty 2

## 2020-08-13 MED ORDER — ALUM & MAG HYDROXIDE-SIMETH 200-200-20 MG/5ML PO SUSP
15.0000 mL | ORAL | Status: DC | PRN
  Administered 2020-08-13 (×2): 15 mL via ORAL
  Filled 2020-08-13 (×2): qty 30

## 2020-08-13 NOTE — Progress Notes (Signed)
PROGRESS NOTE    Melinda [name]  IOE:[id number] DOB: 1955/05/21 DOA: 08/11/2020 PCP: Donnie Coffin, MD  Brief Narrative:65/F with history of COPD, CAD/CABG in 2016, atrial flutter/fibrillation on Eliquis, type 2 diabetes mellitus, ongoing tobacco abuse, PAD with chronic left foot wound, hypertension, dyslipidemia, obesity was brought to the ED after a fall on the bus, patient reports shortness of breath and cough for a few days prior, long history of choking on her food when she eats along with intermittent nausea and dry heaves, and EMS arrived she was hypoxic with A. fib RVR and subsequently cardioverted to sinus rhythm, in the ED she was placed on BiPAP for acute respiratory acidosis, chest x-ray noted bilateral pulmonary infiltrates, she was febrile to 100.6 and CT chest noted bilateral effusions and airspace disease  Assessment & Plan:   Acute hypoxic respiratory failure Aspiration pneumonia Acute on chronic combined systolic and diastolic CHF -Clinically improving, off BiPAP yesterday -Cultures are negative will transition from IV Zosyn to Augmentin today -SLP evaluation completed, mild aspiration risk, recommended dysphagia 3 diet -Diuresed with IV Lasix as well, transition to oral from tomorrow -Wean O2 -Ambulate, PT OT  Acute on chronic systolic and diastolic CHF -Last echo in 11/21 with EF of 40% and grade 1 diastolic dysfunction -Has bilateral pleural effusions on imaging which could be parapneumonic as well, diuresed with IV Lasix yesterday -Repeat echo with EF of 30-35% -Restart oral Lasix from tomorrow  AKI on CKD 3a -Baseline creatinine around 1.2 -Monitor with diuresis  Abnormal LFTs -Could be passive hepatic congestion, CT abdomen pelvis noted unremarkable gallbladder, liver etc.  Paroxysmal atrial fibrillation -Resume Eliquis and Lopressor  Type 2 diabetes mellitus with hyperglycemia -Continue sliding scale insulin, follow-up hemoglobin A1c  Chronic left  foot wound -Does not appear overtly infected at this time, follows at the wound clinic  Ambulatory dysfunction, frequent falls -PT OT eval   DVT prophylaxis: Eliquis Code Status: Full code Family Communication: Discussed with patient and brother at bedside Disposition Plan:  Status is: Inpatient  Remains inpatient appropriate because:Inpatient level of care appropriate due to severity of illness  Dispo:  Patient From: Home  Planned Disposition: Home  Medically stable for discharge: No    Consultants:      Procedures:   Antimicrobials:    Subjective: -Feels okay, denies any specific complaints this morning, breathing is improving, feels better overall  Objective: Vitals:   08/13/20 1000 08/13/20 1100 08/13/20 1200 08/13/20 1234  BP: (!) 139/58 (!) 159/79 (!) 77/48 (!) 101/57  Pulse: 83 82 85 85  Resp: 16 11 14    Temp:      TempSrc:      SpO2: 97% 96% 96% 99%  Weight:      Height:        Intake/Output Summary (Last 24 hours) at 08/13/2020 1317 Last data filed at 08/13/2020 1100 Gross per 24 hour  Intake 154.58 ml  Output 5100 ml  Net -4945.42 ml   Filed Weights   08/11/20 1355  Weight: 113 kg    Examination:  General exam: Obese chronically ill female sitting up in bed, somnolent but easily arousable, oriented to self and place, HEENT: Neck obese unable to assess JVD CVS: S1-S2, regular rate rhythm Lungs: Few scattered rhonchi especially at the bases, otherwise clear Abdomen: Soft, obese, nontender, bowel sounds present Extremities: Left foot with small superficial wound on the plantar aspect of her great toe Psychiatry: Flat affect    Data Reviewed:   CBC: Recent  Labs  Lab 08/11/20 1356 08/12/20 0411 08/13/20 0408  WBC 12.5* 13.1* 9.7  NEUTROABS 9.4* 11.1*  --   HGB 12.1 13.6 12.7  HCT 37.6 39.4 36.9  MCV 76.4* 74.1* 73.8*  PLT 331 331 503   Basic Metabolic Panel: Recent Labs  Lab 08/11/20 1356 08/12/20 0411 08/13/20 0408  NA 135  140 144  K 4.7 3.5 3.0*  CL 101 101 97*  CO2 20* 28 34*  GLUCOSE 243* 166* 162*  BUN 15 16 16   CREATININE 1.60* 1.40* 1.49*  CALCIUM 8.4* 8.9 8.7*  MG  --  1.8  --   PHOS  --  3.4  --    GFR: Estimated Creatinine Clearance: 49.4 mL/min (A) (by C-G formula based on SCr of 1.49 mg/dL (H)). Liver Function Tests: Recent Labs  Lab 08/11/20 1356 08/12/20 0411  AST 61* 77*  ALT 42 61*  ALKPHOS 121 136*  BILITOT 2.9* 3.1*  PROT 6.6 7.1  ALBUMIN 2.8* 3.0*   No results for input(s): LIPASE, AMYLASE in the last 168 hours. No results for input(s): AMMONIA in the last 168 hours. Coagulation Profile: No results for input(s): INR, PROTIME in the last 168 hours. Cardiac Enzymes: No results for input(s): CKTOTAL, CKMB, CKMBINDEX, TROPONINI in the last 168 hours. BNP (last 3 results) No results for input(s): PROBNP in the last 8760 hours. HbA1C: No results for input(s): HGBA1C in the last 72 hours. CBG: Recent Labs  Lab 08/11/20 2139 08/12/20 0719 08/12/20 1112 08/12/20 1648  GLUCAP 171* 168* 119* 184*   Lipid Profile: No results for input(s): CHOL, HDL, LDLCALC, TRIG, CHOLHDL, LDLDIRECT in the last 72 hours. Thyroid Function Tests: No results for input(s): TSH, T4TOTAL, FREET4, T3FREE, THYROIDAB in the last 72 hours. Anemia Panel: No results for input(s): VITAMINB12, FOLATE, FERRITIN, TIBC, IRON, RETICCTPCT in the last 72 hours. Urine analysis:    Component Value Date/Time   COLORURINE YELLOW (A) 08/11/2020 1356   APPEARANCEUR CLEAR (A) 08/11/2020 1356   APPEARANCEUR Clear 10/10/2016 1911   LABSPEC 1.011 08/11/2020 1356   LABSPEC 1.031 06/01/2014 2258   PHURINE 6.0 08/11/2020 1356   GLUCOSEU NEGATIVE 08/11/2020 1356   GLUCOSEU 50 mg/dL 06/01/2014 2258   HGBUR NEGATIVE 08/11/2020 [place] 08/11/2020 1356   BILIRUBINUR Negative 10/10/2016 1911   BILIRUBINUR Negative 06/01/2014 2258   KETONESUR 5 (A) 08/11/2020 1356   PROTEINUR 30 (A) 08/11/2020 1356    UROBILINOGEN 1.0 06/07/2014 0952   NITRITE NEGATIVE 08/11/2020 1356   LEUKOCYTESUR NEGATIVE 08/11/2020 1356   LEUKOCYTESUR Negative 06/01/2014 2258   Sepsis Labs: @LABRCNTIP (procalcitonin:4,lacticidven:4)  ) Recent Results (from the past 240 hour(s))  Blood culture (routine x 2)     Status: None (Preliminary result)   Collection Time: 08/11/20  1:56 PM   Specimen: BLOOD  Result Value Ref Range Status   Specimen Description BLOOD LEFT ANTECUBITAL  Final   Special Requests   Final    BOTTLES DRAWN AEROBIC AND ANAEROBIC Blood Culture adequate volume   Culture   Final    NO GROWTH 2 DAYS Performed at [facility], [address], [place], [place] [address]    Report Status PENDING  Incomplete  Resp Panel by RT-PCR (Flu A&B, Covid) Nasopharyngeal Swab     Status: None   Collection Time: 08/11/20  1:56 PM   Specimen: Nasopharyngeal Swab; Nasopharyngeal(NP) swabs in vial transport medium  Result Value Ref Range Status   SARS Coronavirus 2 by RT PCR NEGATIVE NEGATIVE Final  Comment: (NOTE) SARS-CoV-2 target nucleic acids are NOT DETECTED.  The SARS-CoV-2 RNA is generally detectable in upper respiratory specimens during the acute phase of infection. The lowest concentration of SARS-CoV-2 viral copies this assay can detect is 138 copies/mL. A negative result does not preclude SARS-Cov-2 infection and should not be used as the sole basis for treatment or other patient management decisions. A negative result may occur with  improper specimen collection/handling, submission of specimen other than nasopharyngeal swab, presence of viral mutation(s) within the areas targeted by this assay, and inadequate number of viral copies(<138 copies/mL). A negative result must be combined with clinical observations, patient history, and epidemiological information. The expected result is Negative.  Fact Sheet for Patients:  EntrepreneurPulse.com.au  Fact Sheet for  Healthcare Providers:  IncredibleEmployment.be  This test is no t yet approved or cleared by the [place] FDA and  has been authorized for detection and/or diagnosis of SARS-CoV-2 by FDA under an Emergency Use Authorization (EUA). This EUA will remain  in effect (meaning this test can be used) for the duration of the COVID-19 declaration under Section 564(b)(1) of the Act, 21 U.S.C.section 360bbb-3(b)(1), unless the authorization is terminated  or revoked sooner.       Influenza A by PCR NEGATIVE NEGATIVE Final   Influenza B by PCR NEGATIVE NEGATIVE Final    Comment: (NOTE) The Xpert Xpress SARS-CoV-2/FLU/RSV plus assay is intended as an aid in the diagnosis of influenza from Nasopharyngeal swab specimens and should not be used as a sole basis for treatment. Nasal washings and aspirates are unacceptable for Xpert Xpress SARS-CoV-2/FLU/RSV testing.  Fact Sheet for Patients: EntrepreneurPulse.com.au  Fact Sheet for Healthcare Providers: IncredibleEmployment.be  This test is not yet approved or cleared by the [place] FDA and has been authorized for detection and/or diagnosis of SARS-CoV-2 by FDA under an Emergency Use Authorization (EUA). This EUA will remain in effect (meaning this test can be used) for the duration of the COVID-19 declaration under Section 564(b)(1) of the Act, 21 U.S.C. section 360bbb-3(b)(1), unless the authorization is terminated or revoked.  Performed at [facility], [address] [place], [place] [address]   Blood culture (routine x 2)     Status: None (Preliminary result)   Collection Time: 08/11/20  1:57 PM   Specimen: BLOOD  Result Value Ref Range Status   Specimen Description BLOOD RIGHT ANTECUBITAL  Final   Special Requests   Final    BOTTLES DRAWN AEROBIC AND ANAEROBIC Blood Culture results may not be optimal due to an excessive volume of blood received in culture  bottles   Culture   Final    NO GROWTH 2 DAYS Performed at [facility], [address] [place], [place] [address]    Report Status PENDING  Incomplete  MRSA PCR Screening     Status: None   Collection Time: 08/11/20  9:52 PM   Specimen: Nasopharyngeal  Result Value Ref Range Status   MRSA by PCR NEGATIVE NEGATIVE Final    Comment:        The GeneXpert MRSA Assay (FDA approved for NASAL specimens only), is one component of a comprehensive MRSA colonization surveillance program. It is not intended to diagnose MRSA infection nor to guide or monitor treatment for MRSA infections. Performed at [facility], [address] [place], [place] [address]          Radiology Studies: CT Head Wo Contrast  Result Date: 08/11/2020 CLINICAL DATA:  Neck injury.  Fall.  EXAM: CT HEAD WITHOUT CONTRAST CT CERVICAL SPINE WITHOUT CONTRAST TECHNIQUE: Multidetector CT imaging of the head and cervical spine was performed following the standard protocol without intravenous contrast. Multiplanar CT image reconstructions of the cervical spine were also generated. COMPARISON:  None. FINDINGS: CT HEAD FINDINGS Brain: No evidence of acute infarction, hemorrhage, hydrocephalus, extra-axial collection or mass lesion/mass effect. Vascular: No hyperdense vessel or unexpected calcification. Skull: Normal. Negative for fracture or focal lesion. Sinuses/Orbits: No acute finding. Other: None. CT CERVICAL SPINE FINDINGS Alignment: Normal. Skull base and vertebrae: No acute fracture. No primary bone lesion or focal pathologic process. Soft tissues and spinal canal: No prevertebral fluid or swelling. No visible canal hematoma. Disc levels: Moderate degenerative disc disease is noted at C3-4, C5-6 and C6-7 with anterior osteophyte formation. Other: None. IMPRESSION: Normal head CT. Moderate multilevel degenerative disc disease. No acute abnormality seen in the cervical spine. Electronically Signed   By:  Marijo Conception M.D.   On: 08/11/2020 16:34   CT Angio Chest PE W and/or Wo Contrast  Result Date: 08/11/2020 CLINICAL DATA:  Shortness of breath EXAM: CT ANGIOGRAPHY CHEST WITH CONTRAST TECHNIQUE: Multidetector CT imaging of the chest was performed using the standard protocol during bolus administration of intravenous contrast. Multiplanar CT image reconstructions and MIPs were obtained to evaluate the vascular anatomy. CONTRAST:  68mL OMNIPAQUE IOHEXOL 350 MG/ML SOLN COMPARISON:  None. FINDINGS: Cardiovascular: Cardiomegaly. Prior CABG. Aortic atherosclerosis. No aneurysm. No filling defects in the pulmonary arteries to suggest pulmonary emboli. Mediastinum/Nodes: Borderline sized mediastinal lymph nodes diffusely as well as bilateral hilar lymph nodes, likely reactive. Trachea and esophagus are unremarkable. Thyroid unremarkable. Lungs/Pleura: Extensive bilateral airspace disease and small to moderate bilateral effusions. Findings concerning for multifocal pneumonia. Upper Abdomen: Imaging into the upper abdomen demonstrates no acute findings. Musculoskeletal: Chest wall soft tissues are unremarkable. No acute bony abnormality. Review of the MIP images confirms the above findings. IMPRESSION: Extensive bilateral airspace disease with small to moderate bilateral pleural effusions. Findings concerning for multifocal pneumonia. No evidence of pulmonary embolus. Cardiomegaly. Aortic Atherosclerosis (ICD10-I70.0). Electronically Signed   By: Rolm Baptise M.D.   On: 08/11/2020 16:36   CT Cervical Spine Wo Contrast  Result Date: 08/11/2020 CLINICAL DATA:  Neck injury.  Fall. EXAM: CT HEAD WITHOUT CONTRAST CT CERVICAL SPINE WITHOUT CONTRAST TECHNIQUE: Multidetector CT imaging of the head and cervical spine was performed following the standard protocol without intravenous contrast. Multiplanar CT image reconstructions of the cervical spine were also generated. COMPARISON:  None. FINDINGS: CT HEAD FINDINGS Brain:  No evidence of acute infarction, hemorrhage, hydrocephalus, extra-axial collection or mass lesion/mass effect. Vascular: No hyperdense vessel or unexpected calcification. Skull: Normal. Negative for fracture or focal lesion. Sinuses/Orbits: No acute finding. Other: None. CT CERVICAL SPINE FINDINGS Alignment: Normal. Skull base and vertebrae: No acute fracture. No primary bone lesion or focal pathologic process. Soft tissues and spinal canal: No prevertebral fluid or swelling. No visible canal hematoma. Disc levels: Moderate degenerative disc disease is noted at C3-4, C5-6 and C6-7 with anterior osteophyte formation. Other: None. IMPRESSION: Normal head CT. Moderate multilevel degenerative disc disease. No acute abnormality seen in the cervical spine. Electronically Signed   By: Marijo Conception M.D.   On: 08/11/2020 16:34   CT ABDOMEN PELVIS W CONTRAST  Result Date: 08/11/2020 CLINICAL DATA:  Abdominal distention EXAM: CT ABDOMEN AND PELVIS WITH CONTRAST TECHNIQUE: Multidetector CT imaging of the abdomen and pelvis was performed using the standard protocol following bolus administration of intravenous contrast. CONTRAST:  80mL OMNIPAQUE IOHEXOL 350 MG/ML SOLN COMPARISON:  09/06/2015 FINDINGS: Lower chest: Bilateral pleural effusions with bilateral lower lobe airspace disease. See further discussion on chest CT. Hepatobiliary: No focal hepatic abnormality. Gallbladder unremarkable. Pancreas: No focal abnormality or ductal dilatation. Spleen: No focal abnormality.  Normal size. Adrenals/Urinary Tract: Fullness of the adrenal glands bilaterally, likely hyperplasia. No renal mass or hydronephrosis. Urinary bladder unremarkable. Stomach/Bowel: Sigmoid diverticulosis. No active diverticulitis. Scattered right colonic diverticulosis. Stomach and small bowel decompressed, unremarkable. Prior gastric bypass. Vascular/Lymphatic: Diffuse aortoiliac atherosclerosis. No evidence of aneurysm or adenopathy. Reproductive: No  mass. Other: No free fluid or free air. Musculoskeletal: No acute bony abnormality. IMPRESSION: Bilateral pleural effusions with lower lobe airspace disease. See further discussion on chest CT report. Scattered colonic diverticulosis. No active diverticulitis. Aortoiliac atherosclerosis. No acute findings in the abdomen or pelvis. Electronically Signed   By: Rolm Baptise M.D.   On: 08/11/2020 16:38   DG Chest Portable 1 View  Result Date: 08/11/2020 CLINICAL DATA:  Short of breath.  Hypoxia. EXAM: PORTABLE CHEST 1 VIEW COMPARISON:  07/01/2018. FINDINGS: Patchy bilateral airspace lung opacities are noted, most evident peripherally and at the bases. Possible small pleural effusions.  No pneumothorax. 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 Cardiac silhouette is mildly enlarged. No convincing mediastinal or hilar masses. Skeletal structures are grossly intact. IMPRESSION: 1. Bilateral airspace lung opacities, which may reflect multifocal pneumonia. Pulmonary edema is possible, supported by possible small effusions and mild cardiomegaly. Electronically Signed   By: Lajean Manes M.D.   On: 08/11/2020 14:23   ECHOCARDIOGRAM COMPLETE  Result Date: 08/12/2020    ECHOCARDIOGRAM REPORT   Patient Name:   [facility] Date of Exam: 08/12/2020 Medical Rec #:  [id number]          Height:       68.5 in Accession #:    [id number]         Weight:       249.1 lb Date of Birth:  March 09, 1956          BSA:          2.256 m Patient Age:    78 years           BP:           141/67 mmHg Patient Gender: F                  HR:           95 bpm. Exam Location:  [facility] Procedure: 2D Echo, Color Doppler, Cardiac Doppler and Strain Analysis Indications:     N17.00 CHF-Acute Systolic  History:         Patient has prior history of Echocardiogram examinations, most                  recent 04/14/2020. Ischemic Cardiomyopathy, Previous Myocardial                  Infarction and CAD, Prior CABG, PVD; Risk Factors:Current                   Smoker, Hypertension, Diabetes and Dyslipidemia.  Sonographer:     Charmayne Sheer RDCS (AE) Referring Phys:  [id number] [place] Diagnosing Phys: Ida Rogue MD  Sonographer Comments: Global longitudinal strain was attempted. IMPRESSIONS  1. Left ventricular ejection fraction, by estimation, is 30 to 35%. The left ventricle has moderately decreased function. The left ventricle demonstrates global hypokinesis. Left ventricular diastolic parameters are indeterminate. The  average left ventricular global longitudinal strain is -12.5 %. The global longitudinal strain is abnormal.  2. Right ventricular systolic function is normal. The right ventricular size is normal. There is normal pulmonary artery systolic pressure.  3. Left atrial size was moderately dilated.  4. The mitral valve is normal in structure. Mild mitral valve regurgitation. FINDINGS  Left Ventricle: Left ventricular ejection fraction, by estimation, is 30 to 35%. The left ventricle has moderately decreased function. The left ventricle demonstrates global hypokinesis. The average left ventricular global longitudinal strain is -12.5 %. The global longitudinal strain is abnormal. The left ventricular internal cavity size was normal in size. There is no left ventricular hypertrophy. Left ventricular diastolic parameters are indeterminate. Right Ventricle: The right ventricular size is normal. No increase in right ventricular wall thickness. Right ventricular systolic function is normal. There is normal pulmonary artery systolic pressure. The tricuspid regurgitant velocity is 2.06 m/s, and  with an assumed right atrial pressure of 5 mmHg, the estimated right ventricular systolic pressure is 15.1 mmHg. Left Atrium: Left atrial size was moderately dilated. Right Atrium: Right atrial size was normal in size. Pericardium: There is no evidence of pericardial effusion. Mitral Valve: The mitral valve is normal in structure. Mild mitral valve regurgitation. No  evidence of mitral valve stenosis. MV peak gradient, 6.5 mmHg. The mean mitral valve gradient is 4.0 mmHg. Tricuspid Valve: The tricuspid valve is normal in structure. Tricuspid valve regurgitation is mild . No evidence of tricuspid stenosis. Aortic Valve: The aortic valve is normal in structure. Aortic valve regurgitation is not visualized. Mild aortic valve sclerosis is present, with no evidence of aortic valve stenosis. Aortic valve mean gradient measures 4.0 mmHg. Aortic valve peak gradient measures 6.0 mmHg. Aortic valve area, by VTI measures 3.08 cm. Pulmonic Valve: The pulmonic valve was normal in structure. Pulmonic valve regurgitation is not visualized. No evidence of pulmonic stenosis. Aorta: The aortic root is normal in size and structure. Venous: The inferior vena cava is normal in size with greater than 50% respiratory variability, suggesting right atrial pressure of 3 mmHg. IAS/Shunts: No atrial level shunt detected by color flow Doppler.  LEFT VENTRICLE PLAX 2D LVIDd:         5.50 cm      Diastology LVIDs:         4.80 cm      LV e' medial:    8.16 cm/s LV PW:         1.10 cm      LV E/e' medial:  15.6 LV IVS:        1.10 cm      LV e' lateral:   9.36 cm/s LVOT diam:     2.30 cm      LV E/e' lateral: 13.6 LV SV:         69 LV SV Index:   31           2D Longitudinal Strain LVOT Area:     4.15 cm     2D Strain GLS Avg:     -12.5 %  LV Volumes (MOD) LV vol d, MOD A2C: 161.0 ml LV vol d, MOD A4C: 134.0 ml LV vol s, MOD A2C: 88.2 ml LV vol s, MOD A4C: 80.2 ml LV SV MOD A2C:     72.8 ml LV SV MOD A4C:     134.0 ml LV SV MOD BP:      63.1 ml RIGHT VENTRICLE RV Basal diam:  3.70 cm LEFT ATRIUM  Index       RIGHT ATRIUM           Index LA diam:        4.80 cm 2.13 cm/m  RA Area:     18.10 cm LA Vol (A2C):   81.2 ml 35.99 ml/m RA Volume:   48.20 ml  21.37 ml/m LA Vol (A4C):   81.0 ml 35.91 ml/m LA Biplane Vol: 84.1 ml 37.28 ml/m  AORTIC VALVE                   PULMONIC VALVE AV Area (Vmax):     3.18 cm    PV Vmax:       1.08 m/s AV Area (Vmean):   2.94 cm    PV Vmean:      78.400 cm/s AV Area (VTI):     3.08 cm    PV VTI:        0.209 m AV Vmax:           122.00 cm/s PV Peak grad:  4.7 mmHg AV Vmean:          91.900 cm/s PV Mean grad:  3.0 mmHg AV VTI:            0.224 m AV Peak Grad:      6.0 mmHg AV Mean Grad:      4.0 mmHg LVOT Vmax:         93.30 cm/s LVOT Vmean:        65.000 cm/s LVOT VTI:          0.166 m LVOT/AV VTI ratio: 0.74  AORTA Ao Root diam: 3.30 cm MITRAL VALVE                TRICUSPID VALVE MV Area (PHT): 4.85 cm     TR Peak grad:   17.0 mmHg MV Area VTI:   3.06 cm     TR Vmax:        206.00 cm/s MV Peak grad:  6.5 mmHg MV Mean grad:  4.0 mmHg     SHUNTS MV Vmax:       1.27 m/s     Systemic VTI:  0.17 m MV Vmean:      94.5 cm/s    Systemic Diam: 2.30 cm MV Decel Time: 157 msec MV E velocity: 127.00 cm/s Ida Rogue MD Electronically signed by Ida Rogue MD Signature Date/Time: 08/12/2020/10:32:58 AM    Final         Scheduled Meds: . acetaminophen  1,000 mg Oral Once  . apixaban  5 mg Oral BID  . chlorhexidine  15 mL Mouth Rinse BID  . Chlorhexidine Gluconate Cloth  6 each Topical Daily  . DULoxetine  60 mg Oral Daily  . ferrous sulfate  325 mg Oral Daily  . mouth rinse  15 mL Mouth Rinse q12n4p  . metoprolol tartrate  25 mg Oral BID  . oxybutynin  5 mg Oral BID  . pantoprazole  40 mg Oral Daily   Continuous Infusions: . piperacillin-tazobactam (ZOSYN)  IV Stopped (08/13/20 1046)     LOS: 2 days    Time spent: 36min  Domenic Polite, MD [facility]  08/13/2020, 1:17 PM

## 2020-08-13 NOTE — Progress Notes (Signed)
Pharmacy Antibiotic Note  Melinda [name] is a 65 y.o. female with atrial flutter on Eliquis, GERD, depression, and HLD who came in complaining of respiratory distress. Patient received one time doses of cefepime, metronidazole, and vancomycin in the ED. Pharmacy has been consulted for Zosyn dosing for aspiration pneumonia.   WBC 12.5>9.7 Lactic acid 7.6>2.8, 24hTmax 99.30F  4/13 CXR: Bilateral airspace lung opacities, which may reflect multifocal pneumonia 4/13 CT chest with findings concerning for multifocal pneumonia  Plan: D2 total IV antibiotics Continue Zosyn 3.375g IV q8h (4 hour infusion)  Monitor renal function and adjust dose as clinically indicated  Height: 5' 8.5" (174 cm) Weight: 113 kg (249 lb 1.9 oz) IBW/kg (Calculated) : 65.05  Temp (24hrs), Avg:98.7 F (37.1 C), Min:98 F (36.7 C), Max:99.14 F (37.3 C)  Recent Labs  Lab 08/11/20 1356 08/11/20 1602 08/12/20 0411 08/13/20 0408  WBC 12.5*  --  13.1* 9.7  CREATININE 1.60*  --  1.40* 1.49*  LATICACIDVEN 7.6* 2.8* 1.8  --     Estimated Creatinine Clearance: 49.4 mL/min (A) (by C-G formula based on SCr of 1.49 mg/dL (H)).    No Known Allergies  Antimicrobials this admission: 4/13 Cefepime, metronidazole, vancomycin x1 4/13 Zosyn >>  Dose adjustments this admission:  Microbiology results: 4/13 BCx: NGTD  Thank you for allowing pharmacy to be a part of this patient's care.  Sherilyn Banker, PharmD Pharmacy Resident  08/13/2020 6:52 AM

## 2020-08-13 NOTE — Progress Notes (Signed)
Physical Therapy Treatment Patient Details Name: Melinda [name] MRN: [id number] DOB: 1956/01/13 Today's Date: 08/13/2020    History of Present Illness Pt is a 65 y.o. female with medical history significant for chronic left foot wound, type 2 diabetes, atrial flutter on Eliquis, coronary artery disease status post CABG in 2016, GERD, essential hypertension, hyperlipidemia, obesity, severe peripheral artery disease who presented to [facility] ED after falling on the bus.  When EMS arrived she was in A. fib with RVR and was having trouble breathing with oxygen saturation in the 60s on room air.  She received 2 shocks by EMS and was cardioverted back to sinus rhythm.    PT Comments    Pt alert, in recliner, agreeable to PT. The patient demonstrated good improvement towards goals this session. She was able to perform several transfers with RW and CGA, effortful but no physical assist, cued for hand placement. She ambulated ~69ft in the room with CGA and RW, decreased velocity noted but pt careful, steady. Requested to use [facility] prior to PT exit and able to stand pivot with CGA and RW. OT in room for session upon PT exit. The patient would benefit from further skilled PT intervention to continue to progress towards goals. Recommendation remains appropriate.     Follow Up Recommendations  Home health PT;Supervision for mobility/OOB;Supervision - Intermittent     Equipment Recommendations  Rolling walker with 5" wheels    Recommendations for Other Services       Precautions / Restrictions Precautions Precautions: Fall Restrictions Weight Bearing Restrictions: No    Mobility  Bed Mobility               General bed mobility comments: pt received and left in chair    Transfers Overall transfer level: Needs assistance Equipment used: Rolling walker (2 wheeled) Transfers: Sit to/from [facility] Sit to Stand: Min guard Stand pivot transfers: Min guard       General  transfer comment: sit <> stand from recliner with CGA, effortful but pt able to perform  Ambulation/Gait Ambulation/Gait assistance: Min guard Gait Distance (Feet): 40 Feet Assistive device: Rolling walker (2 wheeled)       General Gait Details: pt able to ambulate with CGA, steady. decreased velocity noted   Stairs             Wheelchair Mobility    Modified Rankin (Stroke Patients Only)       Balance Overall balance assessment: Needs assistance Sitting-balance support: Feet supported Sitting balance-Leahy Scale: Good     Standing balance support: No upper extremity supported Standing balance-Leahy Scale: Fair                              Cognition Arousal/Alertness: Awake/alert Behavior During Therapy: WFL for tasks assessed/performed Overall Cognitive Status: Within Functional Limits for tasks assessed                                        Exercises Other Exercises Other Exercises: pt able to stand pivot to commode in room with CGA and RW. OT in room to take over session    General Comments        Pertinent Vitals/Pain Pain Assessment: No/denies pain    Home Living  Prior Function            PT Goals (current goals can now be found in the care plan section) Progress towards PT goals: Progressing toward goals    Frequency    Min 2X/week      PT Plan Current plan remains appropriate    Co-evaluation              AM-PAC PT "6 Clicks" Mobility   Outcome Measure  Help needed turning from your back to your side while in a flat bed without using bedrails?: A Little Help needed moving from lying on your back to sitting on the side of a flat bed without using bedrails?: A Little Help needed moving to and from a bed to a chair (including a wheelchair)?: A Little Help needed standing up from a chair using your arms (e.g., wheelchair or bedside chair)?: A Little Help needed to  walk in hospital room?: A Little Help needed climbing 3-5 steps with a railing? : A Lot 6 Click Score: 17    End of Session Equipment Utilized During Treatment: Gait belt Activity Tolerance: Patient tolerated treatment well Patient left: in chair;with call bell/phone within reach;with family/visitor present Nurse Communication: Mobility status PT Visit Diagnosis: Muscle weakness (generalized) (M62.81);Difficulty in walking, not elsewhere classified (R26.2);Other abnormalities of gait and mobility (R26.89)     Time: 9021-1155 PT Time Calculation (min) (ACUTE ONLY): 29 min  Charges:  $Therapeutic Exercise: 8-22 mins $Therapeutic Activity: 8-22 mins                     Lieutenant Diego PT, DPT 2:13 PM,08/13/20

## 2020-08-13 NOTE — Progress Notes (Signed)
Occupational Therapy Treatment Patient Details Name: Melinda [name] MRN: [id number] DOB: January 07, 1956 Today's Date: 08/13/2020    History of present illness Pt is a 65 y.o. female with medical history significant for chronic left foot wound, type 2 diabetes, atrial flutter on Eliquis, coronary artery disease status post CABG in 2016, GERD, essential hypertension, hyperlipidemia, obesity, severe peripheral artery disease who presented to [facility] ED after falling on the bus.  When EMS arrived she was in A. fib with RVR and was having trouble breathing with oxygen saturation in the 60s on room air.  She received 2 shocks by EMS and was cardioverted back to sinus rhythm.   OT comments  [name] was seen for OT treatment on this date. Upon arrival to room pt seated on [facility] with PT. Pt requires CGA + RW for BSC t/f, MIN A perihygiene/clothing mgmt in standing, assist to stabilize RW. Pt passed gas but unsuccessful BM. Pt completed seated therex as described below. Pt demonstrated poor activity tolerance but good technique and recall of exercises. Pt instructed in HEP for bed and chair exercises. Pt provided red theraband. Pt making good progress toward goals. Pt continues to benefit from skilled OT services to maximize return to PLOF and minimize risk of future falls, injury, caregiver burden, and readmission. Will continue to follow POC. Discharge recommendation remains appropriate.    Follow Up Recommendations  Home health OT;Supervision - Intermittent    Equipment Recommendations  3 in 1 bedside commode    Recommendations for Other Services      Precautions / Restrictions Precautions Precautions: Fall Restrictions Weight Bearing Restrictions: No       Mobility Bed Mobility Overal bed mobility: Needs Assistance             General bed mobility comments: received on BSC and left in chair    Transfers Overall transfer level: Needs assistance Equipment used: Rolling walker (2  wheeled) Transfers: Sit to/from Stand;Stand Pivot Transfers Sit to Stand: Min guard Stand pivot transfers: Min guard       General transfer comment: slow and effortful but no physical assist    Balance Overall balance assessment: Needs assistance Sitting-balance support: Feet supported Sitting balance-Leahy Scale: Good     Standing balance support: Single extremity supported;During functional activity Standing balance-Leahy Scale: Fair                             ADL either performed or assessed with clinical judgement   ADL Overall ADL's : Needs assistance/impaired                                       General ADL Comments: CGA + RW for BSC t/f, MIN A perihygiene/clothing mgmt in standing, assist to stabilize RW.               Cognition Arousal/Alertness: Awake/alert Behavior During Therapy: WFL for tasks assessed/performed Overall Cognitive Status: Within Functional Limits for tasks assessed                                          Exercises Exercises: Other exercises;General Upper Extremity;General Lower Extremity General Exercises - Upper Extremity Chair Push Up: AROM;Strengthening;Both;5 reps;Seated General Exercises - Lower Extremity Gluteal Sets: AROM;Strengthening;Both;10 reps;Seated Long Arc Quad:  AROM;Strengthening;Both;10 reps;Seated Hip Flexion/Marching: AROM;Strengthening;Both;10 reps;Seated Other Exercises Other Exercises: Pt educated re: OT role, DME recs, d/c recs, HEP Other Exercises: Toileting, sit<>stand, SPT, sitting/standing balance/tolerance           Pertinent Vitals/ Pain       Pain Assessment: No/denies pain         Frequency  Min 2X/week        Progress Toward Goals  OT Goals(current goals can now be found in the care plan section)  Progress towards OT goals: Progressing toward goals  Acute Rehab OT Goals Patient Stated Goal: to go home OT Goal Formulation: With  patient/family Time For Goal Achievement: 08/26/20 Potential to Achieve Goals: Good ADL Goals Pt Will Perform Grooming: with modified independence;standing Pt Will Perform Lower Body Dressing: with modified independence;sit to/from stand Pt Will Transfer to Toilet: with modified independence;ambulating;regular height toilet  Plan Discharge plan remains appropriate;Frequency remains appropriate       AM-PAC OT "6 Clicks" Daily Activity     Outcome Measure   Help from another person eating meals?: None Help from another person taking care of personal grooming?: A Little Help from another person toileting, which includes using toliet, bedpan, or urinal?: A Little Help from another person bathing (including washing, rinsing, drying)?: A Little Help from another person to put on and taking off regular upper body clothing?: A Little Help from another person to put on and taking off regular lower body clothing?: A Little 6 Click Score: 19    End of Session    OT Visit Diagnosis: Unsteadiness on feet (R26.81);Other abnormalities of gait and mobility (R26.89)   Activity Tolerance Patient tolerated treatment well   Patient Left in chair;with call bell/phone within reach   Nurse Communication          Time: 1400-1416 OT Time Calculation (min): 16 min  Charges: OT General Charges $OT Visit: 1 Visit OT Treatments $Therapeutic Exercise: 8-22 mins  Dessie Coma, M.S. OTR/L  08/13/20, 4:10 PM  ascom [phone]

## 2020-08-14 LAB — COMPREHENSIVE METABOLIC PANEL
ALT: 48 U/L — ABNORMAL HIGH (ref 0–44)
AST: 40 U/L (ref 15–41)
Albumin: 2.6 g/dL — ABNORMAL LOW (ref 3.5–5.0)
Alkaline Phosphatase: 117 U/L (ref 38–126)
Anion gap: 11 (ref 5–15)
BUN: 10 mg/dL (ref 8–23)
CO2: 33 mmol/L — ABNORMAL HIGH (ref 22–32)
Calcium: 8.7 mg/dL — ABNORMAL LOW (ref 8.9–10.3)
Chloride: 98 mmol/L (ref 98–111)
Creatinine, Ser: 1.14 mg/dL — ABNORMAL HIGH (ref 0.44–1.00)
GFR, Estimated: 53 mL/min — ABNORMAL LOW (ref >60)
Glucose, Bld: 164 mg/dL — ABNORMAL HIGH (ref 70–99)
Potassium: 3.2 mmol/L — ABNORMAL LOW (ref 3.5–5.1)
Sodium: 142 mmol/L (ref 135–145)
Total Bilirubin: 1.7 mg/dL — ABNORMAL HIGH (ref 0.3–1.2)
Total Protein: 6.8 g/dL (ref 6.5–8.1)

## 2020-08-14 LAB — CBC
HCT: 38.8 % (ref 36.0–46.0)
Hemoglobin: 12.9 g/dL (ref 12.0–15.0)
MCH: 25.3 pg — ABNORMAL LOW (ref 26.0–34.0)
MCHC: 33.2 g/dL (ref 30.0–36.0)
MCV: 76.1 fL — ABNORMAL LOW (ref 80.0–100.0)
Platelets: 320 10*3/uL (ref 150–400)
RBC: 5.1 MIL/uL (ref 3.87–5.11)
RDW: 23.1 % — ABNORMAL HIGH (ref 11.5–15.5)
WBC: 8.6 10*3/uL (ref 4.0–10.5)
nRBC: 0 % (ref 0.0–0.2)

## 2020-08-14 LAB — GLUCOSE, CAPILLARY: Glucose-Capillary: 193 mg/dL — ABNORMAL HIGH (ref 70–99)

## 2020-08-14 MED ORDER — POTASSIUM CHLORIDE CRYS ER 20 MEQ PO TBCR
40.0000 meq | EXTENDED_RELEASE_TABLET | Freq: Two times a day (BID) | ORAL | Status: DC
  Administered 2020-08-14: 40 meq via ORAL
  Filled 2020-08-14: qty 2

## 2020-08-14 MED ORDER — SPIRONOLACTONE 25 MG PO TABS
25.0000 mg | ORAL_TABLET | Freq: Every day | ORAL | Status: DC
  Administered 2020-08-14 – 2020-08-15 (×2): 25 mg via ORAL
  Filled 2020-08-14 (×3): qty 1

## 2020-08-14 NOTE — Progress Notes (Signed)
PROGRESS NOTE    Melinda [name]  JGO:[id number] DOB: 07-26-1955 DOA: 08/11/2020 PCP: Donnie Coffin, MD  Brief Narrative:64/F with history of COPD, CAD/CABG in 2016, atrial flutter/fibrillation on Eliquis, type 2 diabetes mellitus, ongoing tobacco abuse, PAD with chronic left foot wound, hypertension, dyslipidemia, obesity was brought to the ED after a fall on the bus, patient reports shortness of breath and cough for a few days prior, long history of choking on her food when she eats along with intermittent nausea and dry heaves, and EMS arrived she was hypoxic with A. fib RVR and subsequently cardioverted to sinus rhythm, in the ED she was placed on BiPAP for acute respiratory acidosis, chest x-ray noted bilateral pulmonary infiltrates, she was febrile to 100.6 and CT chest noted bilateral effusions and airspace disease  Assessment & Plan:   Acute hypoxic respiratory failure Aspiration pneumonia Acute on chronic combined systolic and diastolic CHF -Clinically improving, off BiPAP yesterday -All cultures are negative, transition from IV Zosyn to Augmentin -SLP evaluation completed, mild aspiration risk, recommended dysphagia 3 diet -Diuresed with IV Lasix, she is 13 L negative, clinically appears euvolemic will transition to oral Lasix today -Weaned off O2 -Increase activity and ambulation -Discharge planning, home with home health services  Acute on chronic systolic and diastolic CHF -Last echo in 11/21 with EF of 40% and grade 1 diastolic dysfunction -Has bilateral pleural effusions on imaging which could be parapneumonic as well, diuresed aggressively with IV Lasix, 13 L negative, changed to p.o.  -Repeat echo with EF of 30-35% -Add low-dose Aldactone, continue Toprol, recommend outpatient consideration of Entresto -Follow-up with cardiology Dr. Rockey Situ in 1 to 2 weeks  AKI on CKD 3a -Baseline creatinine around 1.2 -Monitor with diuresis  Abnormal LFTs -Due to passive hepatic  congestion, CT abdomen pelvis noted unremarkable gallbladder, liver etc. -Improving  Paroxysmal atrial fibrillation -Continue Eliquis and Lopressor  Type 2 diabetes mellitus with hyperglycemia -Continue sliding scale insulin, follow-up hemoglobin A1c  Chronic left foot wound -Does not appear overtly infected at this time, follows at the wound clinic  Ambulatory dysfunction, frequent falls -PT OT eval  DVT prophylaxis: Eliquis Code Status: Full code Family Communication: Discussed with patient and sister at bedside Disposition Plan:  Status is: Inpatient  Remains inpatient appropriate because:Inpatient level of care appropriate due to severity of illness  Dispo:  Patient From: Home  Planned Disposition: Home  Medically stable for discharge: No    Consultants:      Procedures:   Antimicrobials:    Subjective: -Feels better overall, breathing significantly better Objective: Vitals:   08/13/20 2052 08/14/20 0449 08/14/20 0724 08/14/20 1203  BP: 140/70 (!) 143/72 (!) 141/75 (!) 153/84  Pulse: 96 93 87 87  Resp: 20 18 17 20   Temp: 98.5 F (36.9 C)  98 F (36.7 C) 97.9 F (36.6 C)  TempSrc: Oral  Oral   SpO2: 93% 99% 99% 94%  Weight:      Height:        Intake/Output Summary (Last 24 hours) at 08/14/2020 1357 Last data filed at 08/14/2020 0500 Gross per 24 hour  Intake --  Output 2000 ml  Net -2000 ml   Filed Weights   08/11/20 1355  Weight: 113 kg    Examination:  General exam: Obese chronically ill female sitting up in bed, somnolent, easily arousable, oriented to self and place HEENT: Neck obese unable to assess JVD CVS: S1-S2, regular rate rhythm Lungs: Few scattered rhonchi at the bases, otherwise clear Abdomen:  Soft, nontender, bowel sounds present  Extremities: Left foot with small superficial wound on the plantar aspect of her great toe Psychiatry: Flat affect    Data Reviewed:   CBC: Recent Labs  Lab 08/11/20 1356 08/12/20 0411  08/13/20 0408 08/14/20 0411  WBC 12.5* 13.1* 9.7 8.6  NEUTROABS 9.4* 11.1*  --   --   HGB 12.1 13.6 12.7 12.9  HCT 37.6 39.4 36.9 38.8  MCV 76.4* 74.1* 73.8* 76.1*  PLT 331 331 306 423   Basic Metabolic Panel: Recent Labs  Lab 08/11/20 1356 08/12/20 0411 08/13/20 0408 08/14/20 0411  NA 135 140 144 142  K 4.7 3.5 3.0* 3.2*  CL 101 101 97* 98  CO2 20* 28 34* 33*  GLUCOSE 243* 166* 162* 164*  BUN 15 16 16 10   CREATININE 1.60* 1.40* 1.49* 1.14*  CALCIUM 8.4* 8.9 8.7* 8.7*  MG  --  1.8  --   --   PHOS  --  3.4  --   --    GFR: Estimated Creatinine Clearance: 64.6 mL/min (A) (by C-G formula based on SCr of 1.14 mg/dL (H)). Liver Function Tests: Recent Labs  Lab 08/11/20 1356 08/12/20 0411 08/14/20 0411  AST 61* 77* 40  ALT 42 61* 48*  ALKPHOS 121 136* 117  BILITOT 2.9* 3.1* 1.7*  PROT 6.6 7.1 6.8  ALBUMIN 2.8* 3.0* 2.6*   No results for input(s): LIPASE, AMYLASE in the last 168 hours. No results for input(s): AMMONIA in the last 168 hours. Coagulation Profile: No results for input(s): INR, PROTIME in the last 168 hours. Cardiac Enzymes: No results for input(s): CKTOTAL, CKMB, CKMBINDEX, TROPONINI in the last 168 hours. BNP (last 3 results) No results for input(s): PROBNP in the last 8760 hours. HbA1C: No results for input(s): HGBA1C in the last 72 hours. CBG: Recent Labs  Lab 08/12/20 0719 08/12/20 1112 08/12/20 1648 08/13/20 1758 08/13/20 2215  GLUCAP 168* 119* 184* 186* 199*   Lipid Profile: No results for input(s): CHOL, HDL, LDLCALC, TRIG, CHOLHDL, LDLDIRECT in the last 72 hours. Thyroid Function Tests: No results for input(s): TSH, T4TOTAL, FREET4, T3FREE, THYROIDAB in the last 72 hours. Anemia Panel: No results for input(s): VITAMINB12, FOLATE, FERRITIN, TIBC, IRON, RETICCTPCT in the last 72 hours. Urine analysis:    Component Value Date/Time   COLORURINE YELLOW (A) 08/11/2020 1356   APPEARANCEUR CLEAR (A) 08/11/2020 1356   APPEARANCEUR Clear  10/10/2016 1911   LABSPEC 1.011 08/11/2020 1356   LABSPEC 1.031 06/01/2014 2258   PHURINE 6.0 08/11/2020 1356   GLUCOSEU NEGATIVE 08/11/2020 1356   GLUCOSEU 50 mg/dL 06/01/2014 2258   HGBUR NEGATIVE 08/11/2020 [place] 08/11/2020 1356   BILIRUBINUR Negative 10/10/2016 1911   BILIRUBINUR Negative 06/01/2014 2258   KETONESUR 5 (A) 08/11/2020 1356   PROTEINUR 30 (A) 08/11/2020 1356   UROBILINOGEN 1.0 06/07/2014 0952   NITRITE NEGATIVE 08/11/2020 1356   LEUKOCYTESUR NEGATIVE 08/11/2020 1356   LEUKOCYTESUR Negative 06/01/2014 2258   Sepsis Labs: @LABRCNTIP (procalcitonin:4,lacticidven:4)  ) Recent Results (from the past 240 hour(s))  Blood culture (routine x 2)     Status: None (Preliminary result)   Collection Time: 08/11/20  1:56 PM   Specimen: BLOOD  Result Value Ref Range Status   Specimen Description BLOOD LEFT ANTECUBITAL  Final   Special Requests   Final    BOTTLES DRAWN AEROBIC AND ANAEROBIC Blood Culture adequate volume   Culture   Final    NO GROWTH 3 DAYS Performed at [facility], [address] [place], [place] [address]    Report Status PENDING  Incomplete  Resp Panel by RT-PCR (Flu A&B, Covid) Nasopharyngeal Swab     Status: None   Collection Time: 08/11/20  1:56 PM   Specimen: Nasopharyngeal Swab; Nasopharyngeal(NP) swabs in vial transport medium  Result Value Ref Range Status   SARS Coronavirus 2 by RT PCR NEGATIVE NEGATIVE Final    Comment: (NOTE) SARS-CoV-2 target nucleic acids are NOT DETECTED.  The SARS-CoV-2 RNA is generally detectable in upper respiratory specimens during the acute phase of infection. The lowest concentration of SARS-CoV-2 viral copies this assay can detect is 138 copies/mL. A negative result does not preclude SARS-Cov-2 infection and should not be used as the sole basis for treatment or other patient management decisions. A negative result may occur with  improper specimen collection/handling, submission  of specimen other than nasopharyngeal swab, presence of viral mutation(s) within the areas targeted by this assay, and inadequate number of viral copies(<138 copies/mL). A negative result must be combined with clinical observations, patient history, and epidemiological information. The expected result is Negative.  Fact Sheet for Patients:  EntrepreneurPulse.com.au  Fact Sheet for Healthcare Providers:  IncredibleEmployment.be  This test is no t yet approved or cleared by the [place] FDA and  has been authorized for detection and/or diagnosis of SARS-CoV-2 by FDA under an Emergency Use Authorization (EUA). This EUA will remain  in effect (meaning this test can be used) for the duration of the COVID-19 declaration under Section 564(b)(1) of the Act, 21 U.S.C.section 360bbb-3(b)(1), unless the authorization is terminated  or revoked sooner.       Influenza A by PCR NEGATIVE NEGATIVE Final   Influenza B by PCR NEGATIVE NEGATIVE Final    Comment: (NOTE) The Xpert Xpress SARS-CoV-2/FLU/RSV plus assay is intended as an aid in the diagnosis of influenza from Nasopharyngeal swab specimens and should not be used as a sole basis for treatment. Nasal washings and aspirates are unacceptable for Xpert Xpress SARS-CoV-2/FLU/RSV testing.  Fact Sheet for Patients: EntrepreneurPulse.com.au  Fact Sheet for Healthcare Providers: IncredibleEmployment.be  This test is not yet approved or cleared by the [place] FDA and has been authorized for detection and/or diagnosis of SARS-CoV-2 by FDA under an Emergency Use Authorization (EUA). This EUA will remain in effect (meaning this test can be used) for the duration of the COVID-19 declaration under Section 564(b)(1) of the Act, 21 U.S.C. section 360bbb-3(b)(1), unless the authorization is terminated or revoked.  Performed at [facility], [address] [place], [place] [address]   Blood culture (routine x 2)     Status: None (Preliminary result)   Collection Time: 08/11/20  1:57 PM   Specimen: BLOOD  Result Value Ref Range Status   Specimen Description BLOOD RIGHT ANTECUBITAL  Final   Special Requests   Final    BOTTLES DRAWN AEROBIC AND ANAEROBIC Blood Culture results may not be optimal due to an excessive volume of blood received in culture bottles   Culture   Final    NO GROWTH 3 DAYS Performed at [facility], [address] [place], [place] [address]    Report Status PENDING  Incomplete  MRSA PCR Screening     Status: None   Collection Time: 08/11/20  9:52 PM   Specimen: Nasopharyngeal  Result Value Ref Range Status   MRSA by PCR NEGATIVE NEGATIVE Final    Comment:        The GeneXpert MRSA Assay (  FDA approved for NASAL specimens only), is one component of a comprehensive MRSA colonization surveillance program. It is not intended to diagnose MRSA infection nor to guide or monitor treatment for MRSA infections. Performed at [facility], [address] [place], [place] [address]      Scheduled Meds: . acetaminophen  1,000 mg Oral Once  . amoxicillin-clavulanate  1 tablet Oral Q12H  . apixaban  5 mg Oral BID  . chlorhexidine  15 mL Mouth Rinse BID  . Chlorhexidine Gluconate Cloth  6 each Topical Daily  . DULoxetine  60 mg Oral Daily  . ferrous sulfate  325 mg Oral Daily  . furosemide  40 mg Oral Daily  . mouth rinse  15 mL Mouth Rinse q12n4p  . metoprolol tartrate  25 mg Oral BID  . oxybutynin  5 mg Oral BID  . pantoprazole  40 mg Oral Daily  . potassium chloride  40 mEq Oral BID   Continuous Infusions:    LOS: 3 days    Time spent: 2min  Domenic Polite, MD [facility]  08/14/2020, 1:57 PM

## 2020-08-14 NOTE — Plan of Care (Signed)
Pt is AAOx4. Pt denies any pain. VSS. Pt on 98% on 3L. Foley care was done UO recorded. All needs attended. Call light is within reach. Safety measures maintained. Will continue to monitor.    Problem: Education: Goal: Knowledge of General Education information will improve Description: Including pain rating scale, medication(s)/side effects and non-pharmacologic comfort measures Outcome: Progressing   Problem: Health Behavior/Discharge Planning: Goal: Ability to manage health-related needs will improve Outcome: Progressing   Problem: Clinical Measurements: Goal: Ability to maintain clinical measurements within normal limits will improve Outcome: Progressing Goal: Will remain free from infection Outcome: Progressing Goal: Diagnostic test results will improve Outcome: Progressing Goal: Respiratory complications will improve Outcome: Progressing Goal: Cardiovascular complication will be avoided Outcome: Progressing   Problem: Activity: Goal: Risk for activity intolerance will decrease Outcome: Progressing   Problem: Nutrition: Goal: Adequate nutrition will be maintained Outcome: Progressing   Problem: Coping: Goal: Level of anxiety will decrease Outcome: Progressing   Problem: Elimination: Goal: Will not experience complications related to bowel motility Outcome: Progressing Goal: Will not experience complications related to urinary retention Outcome: Progressing   Problem: Pain Managment: Goal: General experience of comfort will improve Outcome: Progressing   Problem: Safety: Goal: Ability to remain free from injury will improve Outcome: Progressing   Problem: Skin Integrity: Goal: Risk for impaired skin integrity will decrease Outcome: Progressing

## 2020-08-14 NOTE — TOC Progression Note (Signed)
Transition of Care [facility]) - Progression Note    Patient Details  Name: Melinda [name] MRN: [id number] Date of Birth: [birth year]  Transition of Care [facility]) CM/SW Contact  Izola Price, RN Phone Number: 08/14/2020, 3:27 PM  Clinical Narrative:  4/16 PT/OT reommneds HH PT/OT. DME: rolling walker and 3:1. [facility] tentatively accepted via [place] today at 325 pm. Will re-confirm tomorrow. NO DC orders yet. NMS per provider notes. Simmie Davies RN CM          Expected Discharge Plan and Services                                                 Social Determinants of Health (SDOH) Interventions    Readmission Risk Interventions No flowsheet data found.

## 2020-08-14 NOTE — Plan of Care (Signed)
  Problem: Health Behavior/Discharge Planning: Goal: Ability to manage health-related needs will improve Outcome: Progressing   Problem: Clinical Measurements: Goal: Respiratory complications will improve Outcome: Progressing   Problem: Activity: Goal: Risk for activity intolerance will decrease Outcome: Progressing   Problem: Elimination: Goal: Will not experience complications related to bowel motility Outcome: Progressing   Problem: Safety: Goal: Ability to remain free from injury will improve Outcome: Progressing

## 2020-08-15 ENCOUNTER — Inpatient Hospital Stay: Payer: Medicare HMO

## 2020-08-15 ENCOUNTER — Encounter: Payer: Self-pay | Admitting: Internal Medicine

## 2020-08-15 LAB — BASIC METABOLIC PANEL
Anion gap: 12 (ref 5–15)
BUN: 11 mg/dL (ref 8–23)
CO2: 31 mmol/L (ref 22–32)
Calcium: 8.7 mg/dL — ABNORMAL LOW (ref 8.9–10.3)
Chloride: 97 mmol/L — ABNORMAL LOW (ref 98–111)
Creatinine, Ser: 1.1 mg/dL — ABNORMAL HIGH (ref 0.44–1.00)
GFR, Estimated: 55 mL/min — ABNORMAL LOW (ref >60)
Glucose, Bld: 206 mg/dL — ABNORMAL HIGH (ref 70–99)
Potassium: 3.7 mmol/L (ref 3.5–5.1)
Sodium: 140 mmol/L (ref 135–145)

## 2020-08-15 LAB — BLOOD GAS, ARTERIAL
Acid-Base Excess: 14.1 mmol/L — ABNORMAL HIGH (ref 0.0–2.0)
Bicarbonate: 37.6 mmol/L — ABNORMAL HIGH (ref 20.0–28.0)
FIO2: 0.21
O2 Saturation: 93.5 %
Patient temperature: 37
pCO2 arterial: 41 mmHg (ref 32.0–48.0)
pH, Arterial: 7.57 — ABNORMAL HIGH (ref 7.350–7.450)
pO2, Arterial: 58 mmHg — ABNORMAL LOW (ref 83.0–108.0)

## 2020-08-15 MED ORDER — FUROSEMIDE 10 MG/ML IJ SOLN
40.0000 mg | Freq: Once | INTRAMUSCULAR | Status: AC
  Administered 2020-08-15: 40 mg via INTRAVENOUS
  Filled 2020-08-15: qty 4

## 2020-08-15 MED ORDER — MORPHINE SULFATE (PF) 2 MG/ML IV SOLN
1.0000 mg | Freq: Once | INTRAVENOUS | Status: AC
  Administered 2020-08-15: 1 mg via INTRAVENOUS
  Filled 2020-08-15: qty 1

## 2020-08-15 NOTE — Progress Notes (Signed)
PROGRESS NOTE    Melinda [name]  GXQ:[id number] DOB: 1955/08/02 DOA: 08/11/2020 PCP: Donnie Coffin, MD  Brief Narrative:64/F with history of COPD, CAD/CABG in 2016, atrial flutter/fibrillation on Eliquis, type 2 diabetes mellitus, ongoing tobacco abuse, PAD with chronic left foot wound, hypertension, dyslipidemia, obesity was brought to the ED after a fall on the bus, patient reports shortness of breath and cough for a few days prior, long history of choking on her food when she eats along with intermittent nausea and dry heaves, and EMS arrived she was hypoxic with A. fib RVR and subsequently cardioverted to sinus rhythm, in the ED she was placed on BiPAP for acute respiratory acidosis, chest x-ray noted bilateral pulmonary infiltrates, she was febrile to 100.6 and CT chest noted bilateral effusions and airspace disease  Assessment & Plan:   Acute hypoxic respiratory failure Aspiration pneumonia Acute on chronic combined systolic and diastolic CHF -Clinically improving, off BiPAP yesterday -All cultures are negative, transition from IV Zosyn to Augmentin -SLP evaluation completed, mild aspiration risk, recommended dysphagia 3 diet -Diuresed with IV Lasix, she is 13 L negative, clinically appears euvolemic will transitioned to oral Lasix -Ambulate, increase activity, slightly somnolent this morning, -Reassess in the afternoon -Discharge planning, and home health services tomorrow  Acute on chronic systolic and diastolic CHF -Last echo in 11/21 with EF of 40% and grade 1 diastolic dysfunction -Has bilateral pleural effusions on imaging which could be parapneumonic as well, diuresed aggressively with IV Lasix, 13 L negative, changed to p.o.  -Repeat echo with EF of 30-35% -Add low-dose Aldactone, continue Toprol, recommend outpatient consideration of Entresto -Follow-up with cardiology Dr. Rockey Situ in 1 to 2 weeks  AKI on CKD 3a -Baseline creatinine around 1.2 -Monitor with  diuresis  Abnormal LFTs -Due to passive hepatic congestion, CT abdomen pelvis noted unremarkable gallbladder, liver etc. -Improving  Paroxysmal atrial fibrillation -Continue Eliquis and Lopressor  Type 2 diabetes mellitus with hyperglycemia -Continue sliding scale insulin, follow-up hemoglobin A1c, it was 8.0 in January  Chronic left foot wound -Does not appear infected at this time, follows at the wound clinic  Ambulatory dysfunction, frequent falls -PT OT eval  DVT prophylaxis: Eliquis Code Status: Full code Family Communication: Discussed with patient and sister at bedside yesterday Disposition Plan:  Status is: Inpatient  Remains inpatient appropriate because:Inpatient level of care appropriate due to severity of illness  Dispo:  Patient From: Home  Planned Disposition: Home  Medically stable for discharge: No    Consultants:      Procedures:   Antimicrobials:    Subjective: -Drowsy and disoriented earlier this morning, now waking up and more appropriate Objective: Vitals:   08/15/20 0212 08/15/20 0342 08/15/20 0813 08/15/20 1106  BP: (!) 124/54 135/70 139/73 132/67  Pulse:  98 91 93  Resp:  20 20 16   Temp: 98.9 F (37.2 C) 98.4 F (36.9 C) 98.3 F (36.8 C) 99.2 F (37.3 C)  TempSrc: Oral Oral  Oral  SpO2: 98% 100% 100% 100%  Weight:      Height:        Intake/Output Summary (Last 24 hours) at 08/15/2020 1429 Last data filed at 08/15/2020 1448 Gross per 24 hour  Intake 120 ml  Output 1980 ml  Net -1860 ml   Filed Weights   08/11/20 1355  Weight: 113 kg    Examination:  General exam: Obese chronically ill female, sitting up in bed, somnolent but easily arousable, oriented to self and place HEENT: Neck obese unable to  assess JVD CVS: S1-S2, regular rate rhythm Lungs: Scattered basilar rhonchi otherwise clear Abdomen: Soft, nontender, BS present Extremities: Left foot with small superficial wound on the plantar aspect of her great  toe Psychiatry: Flat affect    Data Reviewed:   CBC: Recent Labs  Lab 08/11/20 1356 08/12/20 0411 08/13/20 0408 08/14/20 0411  WBC 12.5* 13.1* 9.7 8.6  NEUTROABS 9.4* 11.1*  --   --   HGB 12.1 13.6 12.7 12.9  HCT 37.6 39.4 36.9 38.8  MCV 76.4* 74.1* 73.8* 76.1*  PLT 331 331 306 245   Basic Metabolic Panel: Recent Labs  Lab 08/11/20 1356 08/12/20 0411 08/13/20 0408 08/14/20 0411 08/15/20 0433  NA 135 140 144 142 140  K 4.7 3.5 3.0* 3.2* 3.7  CL 101 101 97* 98 97*  CO2 20* 28 34* 33* 31  GLUCOSE 243* 166* 162* 164* 206*  BUN 15 16 16 10 11   CREATININE 1.60* 1.40* 1.49* 1.14* 1.10*  CALCIUM 8.4* 8.9 8.7* 8.7* 8.7*  MG  --  1.8  --   --   --   PHOS  --  3.4  --   --   --    GFR: Estimated Creatinine Clearance: 67 mL/min (A) (by C-G formula based on SCr of 1.1 mg/dL (H)). Liver Function Tests: Recent Labs  Lab 08/11/20 1356 08/12/20 0411 08/14/20 0411  AST 61* 77* 40  ALT 42 61* 48*  ALKPHOS 121 136* 117  BILITOT 2.9* 3.1* 1.7*  PROT 6.6 7.1 6.8  ALBUMIN 2.8* 3.0* 2.6*   No results for input(s): LIPASE, AMYLASE in the last 168 hours. No results for input(s): AMMONIA in the last 168 hours. Coagulation Profile: No results for input(s): INR, PROTIME in the last 168 hours. Cardiac Enzymes: No results for input(s): CKTOTAL, CKMB, CKMBINDEX, TROPONINI in the last 168 hours. BNP (last 3 results) No results for input(s): PROBNP in the last 8760 hours. HbA1C: No results for input(s): HGBA1C in the last 72 hours. CBG: Recent Labs  Lab 08/12/20 1112 08/12/20 1648 08/13/20 1758 08/13/20 2215 08/14/20 2157  GLUCAP 119* 184* 186* 199* 193*   Lipid Profile: No results for input(s): CHOL, HDL, LDLCALC, TRIG, CHOLHDL, LDLDIRECT in the last 72 hours. Thyroid Function Tests: No results for input(s): TSH, T4TOTAL, FREET4, T3FREE, THYROIDAB in the last 72 hours. Anemia Panel: No results for input(s): VITAMINB12, FOLATE, FERRITIN, TIBC, IRON, RETICCTPCT in the  last 72 hours. Urine analysis:    Component Value Date/Time   COLORURINE YELLOW (A) 08/11/2020 1356   APPEARANCEUR CLEAR (A) 08/11/2020 1356   APPEARANCEUR Clear 10/10/2016 1911   LABSPEC 1.011 08/11/2020 1356   LABSPEC 1.031 06/01/2014 2258   PHURINE 6.0 08/11/2020 1356   GLUCOSEU NEGATIVE 08/11/2020 1356   GLUCOSEU 50 mg/dL 06/01/2014 2258   HGBUR NEGATIVE 08/11/2020 1356   BILIRUBINUR NEGATIVE 08/11/2020 1356   BILIRUBINUR Negative 10/10/2016 1911   BILIRUBINUR Negative 06/01/2014 2258   KETONESUR 5 (A) 08/11/2020 1356   PROTEINUR 30 (A) 08/11/2020 1356   UROBILINOGEN 1.0 06/07/2014 0952   NITRITE NEGATIVE 08/11/2020 1356   LEUKOCYTESUR NEGATIVE 08/11/2020 1356   LEUKOCYTESUR Negative 06/01/2014 2258   Sepsis Labs: @LABRCNTIP (procalcitonin:4,lacticidven:4)  ) Recent Results (from the past 240 hour(s))  Blood culture (routine x 2)     Status: None (Preliminary result)   Collection Time: 08/11/20  1:56 PM   Specimen: BLOOD  Result Value Ref Range Status   Specimen Description BLOOD LEFT ANTECUBITAL  Final   Special Requests   Final  BOTTLES DRAWN AEROBIC AND ANAEROBIC Blood Culture adequate volume   Culture   Final    NO GROWTH 4 DAYS Performed at [facility], [address] [place], [place] [address]    Report Status PENDING  Incomplete  Resp Panel by RT-PCR (Flu A&B, Covid) Nasopharyngeal Swab     Status: None   Collection Time: 08/11/20  1:56 PM   Specimen: Nasopharyngeal Swab; Nasopharyngeal(NP) swabs in vial transport medium  Result Value Ref Range Status   SARS Coronavirus 2 by RT PCR NEGATIVE NEGATIVE Final    Comment: (NOTE) SARS-CoV-2 target nucleic acids are NOT DETECTED.  The SARS-CoV-2 RNA is generally detectable in upper respiratory specimens during the acute phase of infection. The lowest concentration of SARS-CoV-2 viral copies this assay can detect is 138 copies/mL. A negative result does not preclude SARS-Cov-2 infection and should  not be used as the sole basis for treatment or other patient management decisions. A negative result may occur with  improper specimen collection/handling, submission of specimen other than nasopharyngeal swab, presence of viral mutation(s) within the areas targeted by this assay, and inadequate number of viral copies(<138 copies/mL). A negative result must be combined with clinical observations, patient history, and epidemiological information. The expected result is Negative.  Fact Sheet for Patients:  EntrepreneurPulse.com.au  Fact Sheet for Healthcare Providers:  IncredibleEmployment.be  This test is no t yet approved or cleared by the [place] FDA and  has been authorized for detection and/or diagnosis of SARS-CoV-2 by FDA under an Emergency Use Authorization (EUA). This EUA will remain  in effect (meaning this test can be used) for the duration of the COVID-19 declaration under Section 564(b)(1) of the Act, 21 U.S.C.section 360bbb-3(b)(1), unless the authorization is terminated  or revoked sooner.       Influenza A by PCR NEGATIVE NEGATIVE Final   Influenza B by PCR NEGATIVE NEGATIVE Final    Comment: (NOTE) The Xpert Xpress SARS-CoV-2/FLU/RSV plus assay is intended as an aid in the diagnosis of influenza from Nasopharyngeal swab specimens and should not be used as a sole basis for treatment. Nasal washings and aspirates are unacceptable for Xpert Xpress SARS-CoV-2/FLU/RSV testing.  Fact Sheet for Patients: EntrepreneurPulse.com.au  Fact Sheet for Healthcare Providers: IncredibleEmployment.be  This test is not yet approved or cleared by the [place] FDA and has been authorized for detection and/or diagnosis of SARS-CoV-2 by FDA under an Emergency Use Authorization (EUA). This EUA will remain in effect (meaning this test can be used) for the duration of the COVID-19 declaration under  Section 564(b)(1) of the Act, 21 U.S.C. section 360bbb-3(b)(1), unless the authorization is terminated or revoked.  Performed at [facility], [address] [place], [place] [address]   Blood culture (routine x 2)     Status: None (Preliminary result)   Collection Time: 08/11/20  1:57 PM   Specimen: BLOOD  Result Value Ref Range Status   Specimen Description BLOOD RIGHT ANTECUBITAL  Final   Special Requests   Final    BOTTLES DRAWN AEROBIC AND ANAEROBIC Blood Culture results may not be optimal due to an excessive volume of blood received in culture bottles   Culture   Final    NO GROWTH 4 DAYS Performed at [facility], [address] [place], [place] [address]    Report Status PENDING  Incomplete  MRSA PCR Screening     Status: None   Collection Time: 08/11/20  9:52 PM   Specimen: Nasopharyngeal  Result Value  Ref Range Status   MRSA by PCR NEGATIVE NEGATIVE Final    Comment:        The GeneXpert MRSA Assay (FDA approved for NASAL specimens only), is one component of a comprehensive MRSA colonization surveillance program. It is not intended to diagnose MRSA infection nor to guide or monitor treatment for MRSA infections. Performed at [facility], [address] [place],  [address]      Scheduled Meds: . acetaminophen  1,000 mg Oral Once  . amoxicillin-clavulanate  1 tablet Oral Q12H  . apixaban  5 mg Oral BID  . chlorhexidine  15 mL Mouth Rinse BID  . Chlorhexidine Gluconate Cloth  6 each Topical Daily  . DULoxetine  60 mg Oral Daily  . ferrous sulfate  325 mg Oral Daily  . furosemide  40 mg Oral Daily  . mouth rinse  15 mL Mouth Rinse q12n4p  . metoprolol tartrate  25 mg Oral BID  . oxybutynin  5 mg Oral BID  . pantoprazole  40 mg Oral Daily  . spironolactone  25 mg Oral Daily   Continuous Infusions:    LOS: 4 days    Time spent: 25min  Domenic Polite, MD [facility]  08/15/2020, 2:29 PM

## 2020-08-15 NOTE — Progress Notes (Addendum)
Pt is complaining of SOB and getting multiple times to sit in the edge of the bed last night and this morning. On assessment pt was only alert to self, place  and time but dx to situation. Pt was place on 2 liters oxygen sating at 98 % and the rest of VSS were stable.. MD Mansy was notified and ordered 40 mg IV lasix, morphine sulfate and STAT chest x-ray. Will continue to monitor.  Update 0332: chest x-ray resulted. MD Mansy made aware. Will continue to monitor,  Update 0344: Per pt report felling better at this time. VSS. Will continue to monitor.

## 2020-08-15 NOTE — TOC Progression Note (Signed)
Transition of Care [facility]) - Progression Note    Patient Details  Name: Melinda [name] MRN: [id number] Date of Birth: 1956/03/30  [facility]) CM/SW Contact  Izola Price, RN Phone Number: 08/15/2020, 12:22 PM  Clinical Narrative:  [facility] [place] has confirmed they will accept patient into [facility] service upon discharge. TOC will continue to monitor for discharge planning needs. Simmie Davies RN CM          Expected Discharge Plan and Services                                                 Social Determinants of Health (SDOH) Interventions    Readmission Risk Interventions No flowsheet data found.

## 2020-08-15 NOTE — Progress Notes (Addendum)
Pt was non verbal on assessment and not able to follow commands. Brother at bedside this time and states " this is not her baseline". MD Mansy made aware. Will continue to monitor.  Update 1941: MD Mansy ordered Blood gas, arterial STAT once. Will continue to monitor.  Update 2112: ABG resulted MD Mansy made aware. Will continue to monitor.  Update 2118: MD Mansy ordered nuero check Q 4 hours. Will continue to monitor.  Update 0000: Pt is asleep at this time. Will continue to monitor.  Update 0233: Pt HR sustaining at 132-135 afib. On assessment pt was sleep, oxygen saturation at 87 % on 2 liters nasal cannula. Pt oxygen increased at 3.5 liters now sating at 98 % .Pt only alert to self only at this time.. VSS. MD Mansy made aware. Will continue to monitor.  Update 0235: MD Mansy ordered 2.5 mg IV metropolol tartrate (Lopressor). Will continue to monitor.  Update 0327: Pt HR sustaining at 130-140 after 2.5 mg IV metropolol tartrate (lopressor). Will continue to monitor.  Update 0402: pt refused the potassium chloride (KLOR-CON) packet 40 mEq. MD Mansy made aware. Will continue to monitor.  Update 0333: MD Mansy states will order. Will continue to monitor.

## 2020-08-15 NOTE — Progress Notes (Signed)
Pt repeatedly trying to get OOB and bed alarm set off. Attempted to ask why pt is getting up and she says she doesn't know and then returns to bed. Pt using yes or no answers and will not talk much. Domenic Polite, MD alerted via secure chat and assessed pt. No new orders at this time.

## 2020-08-15 NOTE — Plan of Care (Signed)
  Problem: Clinical Measurements: Goal: Cardiovascular complication will be avoided Outcome: Progressing   Problem: Clinical Measurements: Goal: Respiratory complications will improve Outcome: Progressing   Problem: Elimination: Goal: Will not experience complications related to bowel motility Outcome: Progressing   Problem: Elimination: Goal: Will not experience complications related to urinary retention Outcome: Progressing   Problem: Safety: Goal: Ability to remain free from injury will improve Outcome: Progressing

## 2020-08-16 ENCOUNTER — Inpatient Hospital Stay: Payer: Medicare HMO

## 2020-08-16 LAB — BASIC METABOLIC PANEL
Anion gap: 11 (ref 5–15)
BUN: 17 mg/dL (ref 8–23)
CO2: 31 mmol/L (ref 22–32)
Calcium: 9 mg/dL (ref 8.9–10.3)
Chloride: 99 mmol/L (ref 98–111)
Creatinine, Ser: 1.06 mg/dL — ABNORMAL HIGH (ref 0.44–1.00)
GFR, Estimated: 58 mL/min — ABNORMAL LOW (ref >60)
Glucose, Bld: 252 mg/dL — ABNORMAL HIGH (ref 70–99)
Potassium: 3.8 mmol/L (ref 3.5–5.1)
Sodium: 141 mmol/L (ref 135–145)

## 2020-08-16 LAB — CBC
HCT: 45.7 % (ref 36.0–46.0)
Hemoglobin: 14.9 g/dL (ref 12.0–15.0)
MCH: 24.7 pg — ABNORMAL LOW (ref 26.0–34.0)
MCHC: 32.6 g/dL (ref 30.0–36.0)
MCV: 75.7 fL — ABNORMAL LOW (ref 80.0–100.0)
Platelets: 317 10*3/uL (ref 150–400)
RBC: 6.04 MIL/uL — ABNORMAL HIGH (ref 3.87–5.11)
RDW: 22.5 % — ABNORMAL HIGH (ref 11.5–15.5)
WBC: 30.8 10*3/uL — ABNORMAL HIGH (ref 4.0–10.5)
nRBC: 0 % (ref 0.0–0.2)

## 2020-08-16 LAB — COMPREHENSIVE METABOLIC PANEL
ALT: 32 U/L (ref 0–44)
AST: 24 U/L (ref 15–41)
Albumin: 2.7 g/dL — ABNORMAL LOW (ref 3.5–5.0)
Alkaline Phosphatase: 115 U/L (ref 38–126)
Anion gap: 11 (ref 5–15)
BUN: 17 mg/dL (ref 8–23)
CO2: 32 mmol/L (ref 22–32)
Calcium: 9.1 mg/dL (ref 8.9–10.3)
Chloride: 98 mmol/L (ref 98–111)
Creatinine, Ser: 1.22 mg/dL — ABNORMAL HIGH (ref 0.44–1.00)
GFR, Estimated: 49 mL/min — ABNORMAL LOW (ref >60)
Glucose, Bld: 259 mg/dL — ABNORMAL HIGH (ref 70–99)
Potassium: 3.9 mmol/L (ref 3.5–5.1)
Sodium: 141 mmol/L (ref 135–145)
Total Bilirubin: 1.3 mg/dL — ABNORMAL HIGH (ref 0.3–1.2)
Total Protein: 7.3 g/dL (ref 6.5–8.1)

## 2020-08-16 LAB — URINALYSIS, COMPLETE (UACMP) WITH MICROSCOPIC
Bacteria, UA: NONE SEEN
Bilirubin Urine: NEGATIVE
Glucose, UA: NEGATIVE mg/dL
Ketones, ur: NEGATIVE mg/dL
Leukocytes,Ua: NEGATIVE
Nitrite: NEGATIVE
Protein, ur: 100 mg/dL — AB
RBC / HPF: >50 RBC/hpf — ABNORMAL HIGH (ref 0–5)
Specific Gravity, Urine: 1.019 (ref 1.005–1.030)
pH: 6 (ref 5.0–8.0)

## 2020-08-16 LAB — CULTURE, BLOOD (ROUTINE X 2)
Culture: NO GROWTH
Culture: NO GROWTH
Special Requests: ADEQUATE

## 2020-08-16 LAB — AMMONIA: Ammonia: 10 umol/L (ref 9–35)

## 2020-08-16 LAB — MAGNESIUM: Magnesium: 1.9 mg/dL (ref 1.7–2.4)

## 2020-08-16 MED ORDER — POTASSIUM CHLORIDE CRYS ER 20 MEQ PO TBCR
40.0000 meq | EXTENDED_RELEASE_TABLET | Freq: Once | ORAL | Status: AC
  Administered 2020-08-16: 40 meq via ORAL
  Filled 2020-08-16: qty 2

## 2020-08-16 MED ORDER — AMIODARONE HCL IN DEXTROSE 360-4.14 MG/200ML-% IV SOLN: 60.0000 mg/h | INTRAVENOUS | Status: DC

## 2020-08-16 MED ORDER — MAGNESIUM SULFATE 2 GM/50ML IV SOLN
2.0000 g | Freq: Once | INTRAVENOUS | Status: AC
  Administered 2020-08-16: 2 g via INTRAVENOUS
  Filled 2020-08-16: qty 50

## 2020-08-16 MED ORDER — SODIUM CHLORIDE 0.9 % IV SOLN: INTRAVENOUS | Status: DC

## 2020-08-16 MED ORDER — AMIODARONE HCL IN DEXTROSE 360-4.14 MG/200ML-% IV SOLN
60.0000 mg/h | INTRAVENOUS | Status: AC
  Administered 2020-08-16 (×2): 60 mg/h via INTRAVENOUS
  Filled 2020-08-16 (×2): qty 200

## 2020-08-16 MED ORDER — DULOXETINE HCL 30 MG PO CPEP
30.0000 mg | ORAL_CAPSULE | Freq: Every day | ORAL | Status: DC
  Administered 2020-08-17 – 2020-08-19 (×3): 30 mg via ORAL
  Filled 2020-08-16 (×4): qty 1

## 2020-08-16 MED ORDER — POTASSIUM CHLORIDE 20 MEQ PO PACK
40.0000 meq | PACK | Freq: Once | ORAL | Status: DC
  Filled 2020-08-16: qty 2

## 2020-08-16 MED ORDER — METOPROLOL TARTRATE 5 MG/5ML IV SOLN
2.5000 mg | Freq: Once | INTRAVENOUS | Status: AC
  Administered 2020-08-16: 2.5 mg via INTRAVENOUS
  Filled 2020-08-16: qty 5

## 2020-08-16 MED ORDER — METOPROLOL TARTRATE 50 MG PO TABS
50.0000 mg | ORAL_TABLET | Freq: Two times a day (BID) | ORAL | Status: DC
  Administered 2020-08-17 – 2020-08-19 (×4): 50 mg via ORAL
  Filled 2020-08-16 (×6): qty 1

## 2020-08-16 MED ORDER — AMIODARONE LOAD VIA INFUSION: 150.0000 mg | Freq: Once | INTRAVENOUS | Status: DC

## 2020-08-16 MED ORDER — SODIUM CHLORIDE 0.9 % IV SOLN
1.0000 g | INTRAVENOUS | Status: DC
  Administered 2020-08-16 – 2020-08-18 (×3): 1 g via INTRAVENOUS
  Filled 2020-08-16: qty 10
  Filled 2020-08-16 (×3): qty 1

## 2020-08-16 MED ORDER — AMIODARONE LOAD VIA INFUSION
150.0000 mg | Freq: Once | INTRAVENOUS | Status: AC
  Administered 2020-08-16: 150 mg via INTRAVENOUS
  Filled 2020-08-16: qty 83.34

## 2020-08-16 MED ORDER — AMIODARONE HCL IN DEXTROSE 360-4.14 MG/200ML-% IV SOLN
30.0000 mg/h | INTRAVENOUS | Status: DC
  Administered 2020-08-16 – 2020-08-17 (×2): 30 mg/h via INTRAVENOUS
  Filled 2020-08-16 (×2): qty 200

## 2020-08-16 MED ORDER — AMIODARONE IV BOLUS ONLY 150 MG/100ML: 150.0000 mg | Freq: Once | INTRAVENOUS | Status: DC

## 2020-08-16 MED ORDER — DIGOXIN 0.25 MG/ML IJ SOLN
0.2500 mg | Freq: Once | INTRAMUSCULAR | Status: AC
  Administered 2020-08-16: 0.25 mg via INTRAVENOUS
  Filled 2020-08-16: qty 2

## 2020-08-16 MED ORDER — AMIODARONE HCL IN DEXTROSE 360-4.14 MG/200ML-% IV SOLN: 30.0000 mg/h | INTRAVENOUS | Status: DC

## 2020-08-16 NOTE — Progress Notes (Signed)
Foley catheter removed at this time, will continue to monitor patient for s/s of urinary retention this shift. Patient not able to follow verbal commands or stay alert long enough to safely swallow p,o. Medications this morning, MD notified, will administer IV medications and continue to assess for safe p.o.  Med adminstration at a later time.

## 2020-08-16 NOTE — Progress Notes (Signed)
IVT consulted for difficult stick.  RN, Ashly to document in flowsheet PIV attempt.

## 2020-08-16 NOTE — Progress Notes (Addendum)
Pt refused the potassium chloride (KLOR-CON) 40 mEq packet. MD Mansy made aware and states to place order for potassium chloride (KLOR-CON) 40 mEq tablet once. Will continue to monitor.  Update 0440: Pt HR stayed at 125-130. After 0.25 mg IV digoxin. MD Mansy made aware. Will continue to monitor.  Update 0444: MD Mansy states will place order. Mag level was order per MD Mansy parameter to give 2 g of IV magnesium sulfate for level less than 2. Will notify incoming shift. Will continue to monitor.

## 2020-08-16 NOTE — Progress Notes (Signed)
PROGRESS NOTE    Melinda [name]  GGY:[id number] DOB: Aug 19, 1955 DOA: 08/11/2020 PCP: Donnie Coffin, MD  Brief Narrative:64/F with history of COPD, CAD/CABG in 2016, atrial flutter/fibrillation on Eliquis, type 2 diabetes mellitus, ongoing tobacco abuse, PAD with chronic left foot wound, hypertension, dyslipidemia, obesity was brought to the ED after a fall on the bus, patient reports shortness of breath and cough for a few days prior, long history of choking on her food when she eats along with intermittent nausea and dry heaves, and EMS arrived she was hypoxic with A. fib RVR and subsequently cardioverted to sinus rhythm, in the ED she was placed on BiPAP for acute respiratory acidosis, chest x-ray noted bilateral pulmonary infiltrates, she was febrile to 100.6 and CT chest noted bilateral effusions and airspace disease  Assessment & Plan:   Confusion, metabolic encephalopathy -Yesterday we suspected her drowsiness was secondary to the morphine she got early a.m., today she remains confused, WBC up to 30 K, urinalysis abnormal, has a Foley catheter,  -Suspect catheter associated UTI -Discontinue Foley catheter, start IV ceftriaxone, check urine culture -Gentle IV fluids, monitor clinically  Acute hypoxic respiratory failure Aspiration pneumonia Acute on chronic combined systolic and diastolic CHF -Clinically improving, off BiPAP yesterday -All cultures are negative, transition from IV Zosyn to Augmentin -SLP evaluation completed, mild aspiration risk, recommended dysphagia 3 diet -Diuresed with IV Lasix, she is 17 L negative,  -Hold Lasix today, gentle IV fluids due to concern for sepsis in the setting of UTI and encephalopathy  Acute on chronic systolic and diastolic CHF -Last echo in 11/21 with EF of 40% and grade 1 diastolic dysfunction -Has bilateral pleural effusions on imaging which could be parapneumonic as well, diuresed aggressively with IV Lasix, 17 L negative, changed to  p.o.  -Repeat echo with EF of 30-35% -continue Toprol, recommend outpatient consideration of Entresto -Holding diuretics today -Follow-up with cardiology Dr. Rockey Situ in 1 to 2 weeks  AKI on CKD 3a -Baseline creatinine around 1.2 -Monitor with diuresis  Abnormal LFTs -Due to passive hepatic congestion, CT abdomen pelvis noted unremarkable gallbladder, liver etc. -Improving  Paroxysmal atrial fibrillation -Continue Eliquis and Lopressor  Type 2 diabetes mellitus with hyperglycemia -Continue sliding scale insulin, follow-up hemoglobin A1c, it was 8.0 in January  Chronic left foot wound -Does not appear infected at this time, follows at the wound clinic  Ambulatory dysfunction, frequent falls -PT OT eval  DVT prophylaxis: Eliquis Code Status: Full code Family Communication: Discussed with patient and sister at bedside yesterday Disposition Plan:  Status is: Inpatient  Remains inpatient appropriate because:Inpatient level of care appropriate due to severity of illness  Dispo:  Patient From: Home  Planned Disposition: Home with Health Care Svc  Medically stable for discharge: No    Consultants:      Procedures:   Antimicrobials:    Subjective: -Remains confused, awake, not answering questions Objective: Vitals:   08/16/20 0615 08/16/20 0734 08/16/20 0745 08/16/20 1122  BP: 120/89 (!) 121/95  110/62  Pulse:    92  Resp: 20 (!) 23 16 20   Temp:  97.7 F (36.5 C)  98.5 F (36.9 C)  TempSrc:  Axillary  Oral  SpO2: 100% 100%  100%  Weight:      Height:        Intake/Output Summary (Last 24 hours) at 08/16/2020 1343 Last data filed at 08/16/2020 1100 Gross per 24 hour  Intake 0 ml  Output 750 ml  Net -750 ml   Filed  Weights   08/11/20 1355  Weight: 113 kg    Examination:  General exam: Obese chronically ill female, laying in bed, awake but confused, does not answer questions, disoriented HEENT: Neck obese unable to assess JVD CVS: S1-S2, regular rate  rhythm Lungs: Decreased breath sounds anteriorly, few rhonchi at the bases Abdomen: Soft, nontender, bowel sounds present GU: Foley catheter with dark urine Extremities, left foot with small superficial wound on the plantar aspect of the great toe  Psychiatry: Flat affect, confused    Data Reviewed:   CBC: Recent Labs  Lab 08/11/20 1356 08/12/20 0411 08/13/20 0408 08/14/20 0411 08/16/20 0901  WBC 12.5* 13.1* 9.7 8.6 30.8*  NEUTROABS 9.4* 11.1*  --   --   --   HGB 12.1 13.6 12.7 12.9 14.9  HCT 37.6 39.4 36.9 38.8 45.7  MCV 76.4* 74.1* 73.8* 76.1* 75.7*  PLT 331 331 306 320 973   Basic Metabolic Panel: Recent Labs  Lab 08/12/20 0411 08/13/20 0408 08/14/20 0411 08/15/20 0433 08/16/20 0634 08/16/20 0900  NA 140 144 142 140 141 141  K 3.5 3.0* 3.2* 3.7 3.8 3.9  CL 101 97* 98 97* 99 98  CO2 28 34* 33* 31 31 32  GLUCOSE 166* 162* 164* 206* 252* 259*  BUN 16 16 10 11 17 17   CREATININE 1.40* 1.49* 1.14* 1.10* 1.06* 1.22*  CALCIUM 8.9 8.7* 8.7* 8.7* 9.0 9.1  MG 1.8  --   --   --  1.9  --   PHOS 3.4  --   --   --   --   --    GFR: Estimated Creatinine Clearance: 60.4 mL/min (A) (by C-G formula based on SCr of 1.22 mg/dL (H)). Liver Function Tests: Recent Labs  Lab 08/11/20 1356 08/12/20 0411 08/14/20 0411 08/16/20 0900  AST 61* 77* 40 24  ALT 42 61* 48* 32  ALKPHOS 121 136* 117 115  BILITOT 2.9* 3.1* 1.7* 1.3*  PROT 6.6 7.1 6.8 7.3  ALBUMIN 2.8* 3.0* 2.6* 2.7*   No results for input(s): LIPASE, AMYLASE in the last 168 hours. Recent Labs  Lab 08/16/20 0901  AMMONIA 10   Coagulation Profile: No results for input(s): INR, PROTIME in the last 168 hours. Cardiac Enzymes: No results for input(s): CKTOTAL, CKMB, CKMBINDEX, TROPONINI in the last 168 hours. BNP (last 3 results) No results for input(s): PROBNP in the last 8760 hours. HbA1C: No results for input(s): HGBA1C in the last 72 hours. CBG: Recent Labs  Lab 08/12/20 1112 08/12/20 1648 08/13/20 1758  08/13/20 2215 08/14/20 2157  GLUCAP 119* 184* 186* 199* 193*   Lipid Profile: No results for input(s): CHOL, HDL, LDLCALC, TRIG, CHOLHDL, LDLDIRECT in the last 72 hours. Thyroid Function Tests: No results for input(s): TSH, T4TOTAL, FREET4, T3FREE, THYROIDAB in the last 72 hours. Anemia Panel: No results for input(s): VITAMINB12, FOLATE, FERRITIN, TIBC, IRON, RETICCTPCT in the last 72 hours. Urine analysis:    Component Value Date/Time   COLORURINE AMBER (A) 08/16/2020 0957   APPEARANCEUR HAZY (A) 08/16/2020 0957   APPEARANCEUR Clear 10/10/2016 1911   LABSPEC 1.019 08/16/2020 0957   LABSPEC 1.031 06/01/2014 2258   PHURINE 6.0 08/16/2020 0957   GLUCOSEU NEGATIVE 08/16/2020 0957   GLUCOSEU 50 mg/dL 06/01/2014 2258   HGBUR LARGE (A) 08/16/2020 0957   BILIRUBINUR NEGATIVE 08/16/2020 0957   BILIRUBINUR Negative 10/10/2016 1911   BILIRUBINUR Negative 06/01/2014 2258   KETONESUR NEGATIVE 08/16/2020 0957   PROTEINUR 100 (A) 08/16/2020 0957   UROBILINOGEN 1.0 06/07/2014  [place] 08/16/2020 0957   LEUKOCYTESUR NEGATIVE 08/16/2020 0957   LEUKOCYTESUR Negative 06/01/2014 2258   Sepsis Labs: @LABRCNTIP (WUXLKGMWNUUVO:5,DGUYQIHKVQQ:5)  ) Recent Results (from the past 240 hour(s))  Blood culture (routine x 2)     Status: None   Collection Time: 08/11/20  1:56 PM   Specimen: BLOOD  Result Value Ref Range Status   Specimen Description BLOOD LEFT ANTECUBITAL  Final   Special Requests   Final    BOTTLES DRAWN AEROBIC AND ANAEROBIC Blood Culture adequate volume   Culture   Final    NO GROWTH 5 DAYS Performed at [facility], [address] [place], [place] [address]    Report Status 08/16/2020 FINAL  Final  Resp Panel by RT-PCR (Flu A&B, Covid) Nasopharyngeal Swab     Status: None   Collection Time: 08/11/20  1:56 PM   Specimen: Nasopharyngeal Swab; Nasopharyngeal(NP) swabs in vial transport medium  Result Value Ref Range Status   SARS Coronavirus 2 by RT  PCR NEGATIVE NEGATIVE Final    Comment: (NOTE) SARS-CoV-2 target nucleic acids are NOT DETECTED.  The SARS-CoV-2 RNA is generally detectable in upper respiratory specimens during the acute phase of infection. The lowest concentration of SARS-CoV-2 viral copies this assay can detect is 138 copies/mL. A negative result does not preclude SARS-Cov-2 infection and should not be used as the sole basis for treatment or other patient management decisions. A negative result may occur with  improper specimen collection/handling, submission of specimen other than nasopharyngeal swab, presence of viral mutation(s) within the areas targeted by this assay, and inadequate number of viral copies(<138 copies/mL). A negative result must be combined with clinical observations, patient history, and epidemiological information. The expected result is Negative.  Fact Sheet for Patients:  EntrepreneurPulse.com.au  Fact Sheet for Healthcare Providers:  IncredibleEmployment.be  This test is no t yet approved or cleared by the [place] FDA and  has been authorized for detection and/or diagnosis of SARS-CoV-2 by FDA under an Emergency Use Authorization (EUA). This EUA will remain  in effect (meaning this test can be used) for the duration of the COVID-19 declaration under Section 564(b)(1) of the Act, 21 U.S.C.section 360bbb-3(b)(1), unless the authorization is terminated  or revoked sooner.       Influenza A by PCR NEGATIVE NEGATIVE Final   Influenza B by PCR NEGATIVE NEGATIVE Final    Comment: (NOTE) The Xpert Xpress SARS-CoV-2/FLU/RSV plus assay is intended as an aid in the diagnosis of influenza from Nasopharyngeal swab specimens and should not be used as a sole basis for treatment. Nasal washings and aspirates are unacceptable for Xpert Xpress SARS-CoV-2/FLU/RSV testing.  Fact Sheet for Patients: EntrepreneurPulse.com.au  Fact Sheet for  Healthcare Providers: IncredibleEmployment.be  This test is not yet approved or cleared by the [place] FDA and has been authorized for detection and/or diagnosis of SARS-CoV-2 by FDA under an Emergency Use Authorization (EUA). This EUA will remain in effect (meaning this test can be used) for the duration of the COVID-19 declaration under Section 564(b)(1) of the Act, 21 U.S.C. section 360bbb-3(b)(1), unless the authorization is terminated or revoked.  Performed at [facility], [address] [place], [place] [address]   Blood culture (routine x 2)     Status: None   Collection Time: 08/11/20  1:57 PM   Specimen: BLOOD  Result Value Ref Range Status   Specimen Description BLOOD RIGHT ANTECUBITAL  Final   Special Requests   Final  BOTTLES DRAWN AEROBIC AND ANAEROBIC Blood Culture results may not be optimal due to an excessive volume of blood received in culture bottles   Culture   Final    NO GROWTH 5 DAYS Performed at [facility], [address] [place], [place] [address]    Report Status 08/16/2020 FINAL  Final  MRSA PCR Screening     Status: None   Collection Time: 08/11/20  9:52 PM   Specimen: Nasopharyngeal  Result Value Ref Range Status   MRSA by PCR NEGATIVE NEGATIVE Final    Comment:        The GeneXpert MRSA Assay (FDA approved for NASAL specimens only), is one component of a comprehensive MRSA colonization surveillance program. It is not intended to diagnose MRSA infection nor to guide or monitor treatment for MRSA infections. Performed at [facility], [address] [place], [place] [address]      Scheduled Meds: . acetaminophen  1,000 mg Oral Once  . apixaban  5 mg Oral BID  . chlorhexidine  15 mL Mouth Rinse BID  . Chlorhexidine Gluconate Cloth  6 each Topical Daily  . DULoxetine  30 mg Oral Daily  . ferrous sulfate  325 mg Oral Daily  . mouth rinse  15 mL Mouth Rinse q12n4p  . metoprolol  tartrate  50 mg Oral BID  . oxybutynin  5 mg Oral BID  . pantoprazole  40 mg Oral Daily  . spironolactone  25 mg Oral Daily   Continuous Infusions: . sodium chloride 75 mL/hr at 08/16/20 1205  . amiodarone 30 mg/hr (08/16/20 1204)  . cefTRIAXone (ROCEPHIN)  IV 1 g (08/16/20 1205)     LOS: 5 days   Time spent: 84min  Domenic Polite, MD [facility]  08/16/2020, 1:43 PM

## 2020-08-16 NOTE — Plan of Care (Signed)
  Problem: Clinical Measurements: Goal: Diagnostic test results will improve Outcome: Progressing   Problem: Elimination: Goal: Will not experience complications related to bowel motility Outcome: Progressing

## 2020-08-16 NOTE — Progress Notes (Signed)
   08/16/20 0453  Assess: MEWS Score  Temp 97.8 F (36.6 C)  BP (!) 143/80  ECG Heart Rate (!) 135  Resp 20  SpO2 97 %  O2 Device Nasal Cannula  O2 Flow Rate (L/min) 3.5 L/min  Assess: MEWS Score  MEWS Temp 0  MEWS Systolic 0  MEWS Pulse 3  MEWS RR 0  MEWS LOC 0  MEWS Score 3  MEWS Score Color Yellow  Assess: if the MEWS score is Yellow or Red  Were vital signs taken at a resting state? Yes  Focused Assessment Change from prior assessment (see assessment flowsheet)  Early Detection of Sepsis Score *See Row Information* Low  MEWS guidelines implemented *See Row Information* Yes  Treat  MEWS Interventions Administered scheduled meds/treatments  Pain Scale 0-10  Pain Score 0  Notify: Charge Nurse/RN  Name of Charge Nurse/RN Notified Hinton Lovely, RN  Date Charge Nurse/RN Notified 08/16/20  Time Charge Nurse/RN Notified [phone]

## 2020-08-17 LAB — COMPREHENSIVE METABOLIC PANEL
ALT: 31 U/L (ref 0–44)
AST: 30 U/L (ref 15–41)
Albumin: 2.4 g/dL — ABNORMAL LOW (ref 3.5–5.0)
Alkaline Phosphatase: 131 U/L — ABNORMAL HIGH (ref 38–126)
Anion gap: 15 (ref 5–15)
BUN: 21 mg/dL (ref 8–23)
CO2: 29 mmol/L (ref 22–32)
Calcium: 8.8 mg/dL — ABNORMAL LOW (ref 8.9–10.3)
Chloride: 101 mmol/L (ref 98–111)
Creatinine, Ser: 1.13 mg/dL — ABNORMAL HIGH (ref 0.44–1.00)
GFR, Estimated: 54 mL/min — ABNORMAL LOW (ref >60)
Glucose, Bld: 216 mg/dL — ABNORMAL HIGH (ref 70–99)
Potassium: 4 mmol/L (ref 3.5–5.1)
Sodium: 145 mmol/L (ref 135–145)
Total Bilirubin: 1.1 mg/dL (ref 0.3–1.2)
Total Protein: 6.7 g/dL (ref 6.5–8.1)

## 2020-08-17 LAB — CBC
HCT: 45.3 % (ref 36.0–46.0)
Hemoglobin: 15 g/dL (ref 12.0–15.0)
MCH: 24.8 pg — ABNORMAL LOW (ref 26.0–34.0)
MCHC: 33.1 g/dL (ref 30.0–36.0)
MCV: 74.8 fL — ABNORMAL LOW (ref 80.0–100.0)
Platelets: 303 10*3/uL (ref 150–400)
RBC: 6.06 MIL/uL — ABNORMAL HIGH (ref 3.87–5.11)
RDW: 22.3 % — ABNORMAL HIGH (ref 11.5–15.5)
WBC: 25.2 10*3/uL — ABNORMAL HIGH (ref 4.0–10.5)
nRBC: 0 % (ref 0.0–0.2)

## 2020-08-17 MED ORDER — SODIUM CHLORIDE 0.9 % IV SOLN: INTRAVENOUS | Status: AC

## 2020-08-17 MED ORDER — DILTIAZEM HCL ER COATED BEADS 180 MG PO CP24
180.0000 mg | ORAL_CAPSULE | Freq: Every day | ORAL | Status: DC
  Administered 2020-08-17 – 2020-08-19 (×3): 180 mg via ORAL
  Filled 2020-08-17 (×3): qty 1

## 2020-08-17 NOTE — Progress Notes (Signed)
PROGRESS NOTE    Melinda [name]  YKD:[id number] DOB: 02/22/1956 DOA: 08/11/2020 PCP: Donnie Coffin, MD  Brief Narrative:64/F with history of COPD, CAD/CABG in 2016, atrial flutter/fibrillation on Eliquis, type 2 diabetes mellitus, ongoing tobacco abuse, PAD with chronic left foot wound, hypertension, dyslipidemia, obesity was brought to the ED after a fall on the bus, patient reports shortness of breath and cough for a few days prior when EMS arrived she was noted to be hypoxic with A. fib RVR, in the ED she was placed on BiPAP for acute respiratory acidosis, chest x-ray noted bilateral pulmonary infiltrates, she was febrile to 100.6 and CT chest noted bilateral effusions and airspace disease. -Admitted to the ICU with hypoxic respiratory failure from aspiration pneumonia and acute on chronic systolic CHF, improved with antibiotics and diuretics, transferred to the floor after improvement -4/18 became septic and lethargic from catheter related UTI, Foley catheter removed, started antibiotics, slowly improving again  Assessment & Plan:   Sepsis, catheter related UTI Toxic encephalopathy -4/17 we suspected her drowsiness was secondary to the morphine she got on 4/17 a.m., 4/18 she remained lethargic and became confused, WBC up to 30 K, urinalysis abnormal, she had a foley placed on 4/16 in ICU for retention,  -Discontinued Foley catheter yesterday, started IV ceftriaxone, FU urine culture -Mental status improving, answers questions more appropriately -Cut down IV fluids -PT OT  Acute hypoxic respiratory failure Aspiration pneumonia Acute on chronic combined systolic and diastolic CHF -Clinically improving, required BiPAP briefly on admission -All cultures are negative, transitioned from IV Zosyn to Augmentin, completed 5 days of antibiotics for this, yesterday switched to ceftriaxone for UTI -SLP evaluation completed, mild aspiration risk, recommended dysphagia 3 diet -Diuresed with IV  Lasix, she is 16 L negative,  -Improved from this standpoint  Acute on chronic systolic and diastolic CHF -Last echo in 11/21 with EF of 40% and grade 1 diastolic dysfunction -Has bilateral pleural effusions on imaging which could be parapneumonic as well, diuresed aggressively with IV Lasix, 16 L negative, changed to p.o.  -Repeat echo with EF of 30-35% -continue Toprol, recommend outpatient consideration of Entresto -holding diuretics in setting of sepsis, UTI, hopefully can resume tomorrow -Follow-up with cardiology Dr. Rockey Situ in 1 to 2 weeks  AKI on CKD 3a -Baseline creatinine around 1.2 -Monitor with diuresis  Abnormal LFTs -Due to passive hepatic congestion, CT abdomen pelvis noted unremarkable gallbladder, liver etc. -Improving  Paroxysmal atrial fibrillation with RVR -Continue Eliquis and Lopressor -Did not get p.o. Lopressor while she was lethargic yesterday and overnight got started on an amiodarone drip, she can take p.o. Lopressor today, also add diltiazem, wean off Amio  Type 2 diabetes mellitus with hyperglycemia -Continue sliding scale insulin, hemoglobin A1c was 8.0 in January  Chronic left foot wound -Does not appear infected at this time, follows at the wound clinic  Ambulatory dysfunction, frequent falls -PT OT eval  DVT prophylaxis: Eliquis Code Status: Full code Family Communication: Updated sister Disposition Plan:  Status is: Inpatient  Remains inpatient appropriate because:Inpatient level of care appropriate due to severity of illness  Dispo:  Patient From: Home  Planned Disposition: Home with Health Care Svc  Medically stable for discharge: No    Consultants:      Procedures:   Antimicrobials:    Subjective: -More awake and lucid this morning, still tired and sleeping more but answers questions much more appropriately and oriented X3  Objective: Vitals:   08/17/20 0731 08/17/20 1004 08/17/20 1152 08/17/20 1351  BP: 94/83 128/86 (!)  143/87   Pulse: (!) 121 (!) 118 (!) 113   Resp: 18  18   Temp: (!) 97.5 F (36.4 C)  98 F (36.7 C)   TempSrc: Oral  Oral   SpO2: 90%  100%   Weight:    99.9 kg  Height:        Intake/Output Summary (Last 24 hours) at 08/17/2020 1355 Last data filed at 08/17/2020 1145 Gross per 24 hour  Intake 1701.98 ml  Output 800 ml  Net 901.98 ml   Filed Weights   08/11/20 1355 08/17/20 1351  Weight: 113 kg 99.9 kg    Examination:  General exam: Obese chronically ill female sitting up in bed, more awake today, answers questions with short answers more appropriately, oriented to self and place and only partly to time  HEENT: Neck obese unable to assess JVD CVS: S1-S2, irregularly irregular rhythm Lungs: Decreased breath sounds at the bases with scattered rhonchi Abdomen: Obese, soft, nontender, bowel sounds present Extremities: left foot with small superficial wound on the plantar aspect of the great toe  Psychiatry: Flat affect, confused    Data Reviewed:   CBC: Recent Labs  Lab 08/11/20 1356 08/12/20 0411 08/13/20 0408 08/14/20 0411 08/16/20 0901 08/17/20 0410  WBC 12.5* 13.1* 9.7 8.6 30.8* 25.2*  NEUTROABS 9.4* 11.1*  --   --   --   --   HGB 12.1 13.6 12.7 12.9 14.9 15.0  HCT 37.6 39.4 36.9 38.8 45.7 45.3  MCV 76.4* 74.1* 73.8* 76.1* 75.7* 74.8*  PLT 331 331 306 320 317 209   Basic Metabolic Panel: Recent Labs  Lab 08/12/20 0411 08/13/20 0408 08/14/20 0411 08/15/20 0433 08/16/20 0634 08/16/20 0900 08/17/20 0410  NA 140   < > 142 140 141 141 145  K 3.5   < > 3.2* 3.7 3.8 3.9 4.0  CL 101   < > 98 97* 99 98 101  CO2 28   < > 33* 31 31 32 29  GLUCOSE 166*   < > 164* 206* 252* 259* 216*  BUN 16   < > 10 11 17 17 21   CREATININE 1.40*   < > 1.14* 1.10* 1.06* 1.22* 1.13*  CALCIUM 8.9   < > 8.7* 8.7* 9.0 9.1 8.8*  MG 1.8  --   --   --  1.9  --   --   PHOS 3.4  --   --   --   --   --   --    < > = values in this interval not displayed.   GFR: Estimated Creatinine  Clearance: 61.1 mL/min (A) (by C-G formula based on SCr of 1.13 mg/dL (H)). Liver Function Tests: Recent Labs  Lab 08/11/20 1356 08/12/20 0411 08/14/20 0411 08/16/20 0900 08/17/20 0410  AST 61* 77* 40 24 30  ALT 42 61* 48* 32 31  ALKPHOS 121 136* 117 115 131*  BILITOT 2.9* 3.1* 1.7* 1.3* 1.1  PROT 6.6 7.1 6.8 7.3 6.7  ALBUMIN 2.8* 3.0* 2.6* 2.7* 2.4*   No results for input(s): LIPASE, AMYLASE in the last 168 hours. Recent Labs  Lab 08/16/20 0901  AMMONIA 10   Coagulation Profile: No results for input(s): INR, PROTIME in the last 168 hours. Cardiac Enzymes: No results for input(s): CKTOTAL, CKMB, CKMBINDEX, TROPONINI in the last 168 hours. BNP (last 3 results) No results for input(s): PROBNP in the last 8760 hours. HbA1C: No results for input(s): HGBA1C in the last 72 hours.  CBG: Recent Labs  Lab 08/12/20 1112 08/12/20 1648 08/13/20 1758 08/13/20 2215 08/14/20 2157  GLUCAP 119* 184* 186* 199* 193*   Lipid Profile: No results for input(s): CHOL, HDL, LDLCALC, TRIG, CHOLHDL, LDLDIRECT in the last 72 hours. Thyroid Function Tests: No results for input(s): TSH, T4TOTAL, FREET4, T3FREE, THYROIDAB in the last 72 hours. Anemia Panel: No results for input(s): VITAMINB12, FOLATE, FERRITIN, TIBC, IRON, RETICCTPCT in the last 72 hours. Urine analysis:    Component Value Date/Time   COLORURINE AMBER (A) 08/16/2020 0957   APPEARANCEUR HAZY (A) 08/16/2020 0957   APPEARANCEUR Clear 10/10/2016 1911   LABSPEC 1.019 08/16/2020 0957   LABSPEC 1.031 06/01/2014 2258   PHURINE 6.0 08/16/2020 0957   GLUCOSEU NEGATIVE 08/16/2020 0957   GLUCOSEU 50 mg/dL 06/01/2014 2258   HGBUR LARGE (A) 08/16/2020 0957   BILIRUBINUR NEGATIVE 08/16/2020 0957   BILIRUBINUR Negative 10/10/2016 1911   BILIRUBINUR Negative 06/01/2014 2258   KETONESUR NEGATIVE 08/16/2020 0957   PROTEINUR 100 (A) 08/16/2020 0957   UROBILINOGEN 1.0 06/07/2014 0952   NITRITE NEGATIVE 08/16/2020 0957   LEUKOCYTESUR  NEGATIVE 08/16/2020 0957   LEUKOCYTESUR Negative 06/01/2014 2258   Sepsis Labs: @LABRCNTIP (procalcitonin:4,lacticidven:4)  ) Recent Results (from the past 240 hour(s))  Blood culture (routine x 2)     Status: None   Collection Time: 08/11/20  1:56 PM   Specimen: BLOOD  Result Value Ref Range Status   Specimen Description BLOOD LEFT ANTECUBITAL  Final   Special Requests   Final    BOTTLES DRAWN AEROBIC AND ANAEROBIC Blood Culture adequate volume   Culture   Final    NO GROWTH 5 DAYS Performed at [facility], [address] [place], [place] [address]    Report Status 08/16/2020 FINAL  Final  Resp Panel by RT-PCR (Flu A&B, Covid) Nasopharyngeal Swab     Status: None   Collection Time: 08/11/20  1:56 PM   Specimen: Nasopharyngeal Swab; Nasopharyngeal(NP) swabs in vial transport medium  Result Value Ref Range Status   SARS Coronavirus 2 by RT PCR NEGATIVE NEGATIVE Final    Comment: (NOTE) SARS-CoV-2 target nucleic acids are NOT DETECTED.  The SARS-CoV-2 RNA is generally detectable in upper respiratory specimens during the acute phase of infection. The lowest concentration of SARS-CoV-2 viral copies this assay can detect is 138 copies/mL. A negative result does not preclude SARS-Cov-2 infection and should not be used as the sole basis for treatment or other patient management decisions. A negative result may occur with  improper specimen collection/handling, submission of specimen other than nasopharyngeal swab, presence of viral mutation(s) within the areas targeted by this assay, and inadequate number of viral copies(<138 copies/mL). A negative result must be combined with clinical observations, patient history, and epidemiological information. The expected result is Negative.  Fact Sheet for Patients:  EntrepreneurPulse.com.au  Fact Sheet for Healthcare Providers:  IncredibleEmployment.be  This test is no t yet approved or  cleared by the [place] FDA and  has been authorized for detection and/or diagnosis of SARS-CoV-2 by FDA under an Emergency Use Authorization (EUA). This EUA will remain  in effect (meaning this test can be used) for the duration of the COVID-19 declaration under Section 564(b)(1) of the Act, 21 U.S.C.section 360bbb-3(b)(1), unless the authorization is terminated  or revoked sooner.       Influenza A by PCR NEGATIVE NEGATIVE Final   Influenza B by PCR NEGATIVE NEGATIVE Final    Comment: (NOTE) The Xpert Xpress SARS-CoV-2/FLU/RSV plus assay is intended as  an aid in the diagnosis of influenza from Nasopharyngeal swab specimens and should not be used as a sole basis for treatment. Nasal washings and aspirates are unacceptable for Xpert Xpress SARS-CoV-2/FLU/RSV testing.  Fact Sheet for Patients: EntrepreneurPulse.com.au  Fact Sheet for Healthcare Providers: IncredibleEmployment.be  This test is not yet approved or cleared by the [place] FDA and has been authorized for detection and/or diagnosis of SARS-CoV-2 by FDA under an Emergency Use Authorization (EUA). This EUA will remain in effect (meaning this test can be used) for the duration of the COVID-19 declaration under Section 564(b)(1) of the Act, 21 U.S.C. section 360bbb-3(b)(1), unless the authorization is terminated or revoked.  Performed at [facility], [address] [place], [place] [address]   Blood culture (routine x 2)     Status: None   Collection Time: 08/11/20  1:57 PM   Specimen: BLOOD  Result Value Ref Range Status   Specimen Description BLOOD RIGHT ANTECUBITAL  Final   Special Requests   Final    BOTTLES DRAWN AEROBIC AND ANAEROBIC Blood Culture results may not be optimal due to an excessive volume of blood received in culture bottles   Culture   Final    NO GROWTH 5 DAYS Performed at [facility], [address] [place], [place]  [address]    Report Status 08/16/2020 FINAL  Final  MRSA PCR Screening     Status: None   Collection Time: 08/11/20  9:52 PM   Specimen: Nasopharyngeal  Result Value Ref Range Status   MRSA by PCR NEGATIVE NEGATIVE Final    Comment:        The GeneXpert MRSA Assay (FDA approved for NASAL specimens only), is one component of a comprehensive MRSA colonization surveillance program. It is not intended to diagnose MRSA infection nor to guide or monitor treatment for MRSA infections. Performed at [facility], [address] [place], [place] [address]      Scheduled Meds: . acetaminophen  1,000 mg Oral Once  . apixaban  5 mg Oral BID  . chlorhexidine  15 mL Mouth Rinse BID  . Chlorhexidine Gluconate Cloth  6 each Topical Daily  . DULoxetine  30 mg Oral Daily  . ferrous sulfate  325 mg Oral Daily  . mouth rinse  15 mL Mouth Rinse q12n4p  . metoprolol tartrate  50 mg Oral BID  . oxybutynin  5 mg Oral BID  . pantoprazole  40 mg Oral Daily   Continuous Infusions: . sodium chloride 75 mL/hr at 08/17/20 1119  . amiodarone 30 mg/hr (08/17/20 3300)  . cefTRIAXone (ROCEPHIN)  IV 1 g (08/17/20 1029)     LOS: 6 days   Time spent: 34min  Domenic Polite, MD [facility]  08/17/2020, 1:55 PM

## 2020-08-17 NOTE — Progress Notes (Signed)
PT Cancellation Note  Patient Details Name: Melinda [name] MRN: [id number] DOB: [birth year]   Cancelled Treatment:    Reason Eval/Treat Not Completed: Other (comment). Pt remains lethargic, not appropriate for PT at this time per RN. Of note, elevated HR at 121bpm this AM. Will continue to follow.   [name] 08/17/2020, 10:37 AM  [name], PT, DPT [phone]

## 2020-08-17 NOTE — Progress Notes (Signed)
OT Cancellation Note  Patient Details Name: Melinda [name] MRN: [id number] DOB: 1955-09-17   Cancelled Treatment:    Reason Eval/Treat Not Completed: Medical issues which prohibited therapy. Pt remains lethargic, not appropriate for OT at this time per RN. Of note, elevated HR 110's - 120's this AM. Will re-attempt at later date/time as medically appropriate.  Hanley Hays, MPH, MS, OTR/L ascom [phone] 08/17/20, 11:29 AM

## 2020-08-17 NOTE — Care Management Important Message (Signed)
Important Message  Patient Details  Name: Melinda [name] MRN: [id number] Date of Birth: December 30, 1955   Medicare Important Message Given:  Yes     [name] 08/17/2020, 3:24 PM

## 2020-08-17 NOTE — Progress Notes (Addendum)
Mobility Specialist - Progress Note   08/17/20 1200  Mobility  Activity Dangled on edge of bed  Level of Assistance Minimal assist, patient does 75% or more  Assistive Device None  Distance Ambulated (ft) 0 ft  Mobility Response Tolerated fair  Mobility performed by Mobility specialist  $Mobility charge 1 Mobility    Mobility cleared by RN to proceed with session. Pt's nephew at bedside. Pt appears fatigue, but engages in/out with mobility tech. Pt reached EOB via log rolling and extra time. Upon sitting, pt began performing 3 straight leg raises before c/o feeling lightheaded that did not self-resolve. BP taken while seated: 106/81. Pt dangled EOB for ~5 minutes total, max HR 114 bpm with O2 maintaining high 90s. Pt returned to supine still c/o mild lightheadedness, BP rechecked: 118/72. Nurse made aware. Pt's nephew reported that he noticed pt had been experiencing dizziness with positional changes for the last 2 weeks and that sometimes it may take up to 15-20 minutes to resolve.    Kathee Delton Mobility Specialist 08/17/20, 12:47 PM

## 2020-08-18 ENCOUNTER — Telehealth: Payer: Self-pay

## 2020-08-18 LAB — URINE CULTURE: Culture: NO GROWTH

## 2020-08-18 LAB — CBC
HCT: 44.4 % (ref 36.0–46.0)
Hemoglobin: 14.6 g/dL (ref 12.0–15.0)
MCH: 24.9 pg — ABNORMAL LOW (ref 26.0–34.0)
MCHC: 32.9 g/dL (ref 30.0–36.0)
MCV: 75.6 fL — ABNORMAL LOW (ref 80.0–100.0)
Platelets: 303 10*3/uL (ref 150–400)
RBC: 5.87 MIL/uL — ABNORMAL HIGH (ref 3.87–5.11)
RDW: 21.8 % — ABNORMAL HIGH (ref 11.5–15.5)
WBC: 17.9 10*3/uL — ABNORMAL HIGH (ref 4.0–10.5)
nRBC: 0 % (ref 0.0–0.2)

## 2020-08-18 LAB — BASIC METABOLIC PANEL
Anion gap: 11 (ref 5–15)
BUN: 17 mg/dL (ref 8–23)
CO2: 29 mmol/L (ref 22–32)
Calcium: 8.8 mg/dL — ABNORMAL LOW (ref 8.9–10.3)
Chloride: 106 mmol/L (ref 98–111)
Creatinine, Ser: 0.94 mg/dL (ref 0.44–1.00)
GFR, Estimated: >60 mL/min (ref >60)
Glucose, Bld: 196 mg/dL — ABNORMAL HIGH (ref 70–99)
Potassium: 3.6 mmol/L (ref 3.5–5.1)
Sodium: 146 mmol/L — ABNORMAL HIGH (ref 135–145)

## 2020-08-18 MED ORDER — SODIUM CHLORIDE 0.9 % IV SOLN: INTRAVENOUS | Status: AC

## 2020-08-18 NOTE — Progress Notes (Signed)
PT Cancellation Note  Patient Details Name: Melinda [name] MRN: [id number] DOB: 04/16/1956   Cancelled Treatment:     Treatment declined by pt due to c/o ongoing fatigue and just finishing up with OT.  Pt is looking forward to hopefully returning home tomorrow.    Josie Dixon 08/18/2020, 5:11 PM

## 2020-08-18 NOTE — Progress Notes (Signed)
Occupational Therapy Treatment Patient Details Name: Juri Dinning MRN: [id number] DOB: Jan 08, 1956 Today's Date: 08/18/2020    History of present illness Pt is a 65 y.o. female with medical history significant for chronic left foot wound, type 2 diabetes, atrial flutter on Eliquis, coronary artery disease status post CABG in 2016, GERD, essential hypertension, hyperlipidemia, obesity, severe peripheral artery disease who presented to [facility] ED after falling on the bus.  When EMS arrived she was in A. fib with RVR and was having trouble breathing with oxygen saturation in the 60s on room air.  She received 2 shocks by EMS and was cardioverted back to sinus rhythm.   OT comments  Pt seen for OT treatment on this date. Upon arrival to room, pt awake seated upright in bed (BP 146/83, HR 116, SpO2 96%). Pt agreeable to OT tx. Pt performed supine>sit transfers with HOB elevated, requiring increased time/effort and MIN GUARD. Following transfer, pt with positive orthostatic vitals (BP 100/85, HR 119, SpO2 96%), however pt denying symptoms. Following 2-mins of sitting EOB, BP 94/73 and unresolved following 2-mins of seated UB/LB exercises (BP 88/70) and with pt remaining asymptomatic.   Pt assisted with lateral scoot towards [place] and returned to supine with MIN GUARD. Pt performed bed-level UB bathing/dressing with SUPERVISION/SET-UP assistance and LB dressing with MOD A. Pt left supine in bed, in no acute distress with BP 99/70. RN informed of orthostatic vitals. Pt continues to benefit from skilled OT services to maximize return to PLOF and minimize risk of future falls, injury, caregiver burden, and readmission. Will continue to follow POC. Discharge recommendation remains appropriate.    Follow Up Recommendations  Home health OT;Supervision - Intermittent    Equipment Recommendations  3 in 1 bedside commode       Precautions / Restrictions Precautions Precautions: Fall Restrictions Weight  Bearing Restrictions: No       Mobility Bed Mobility Overal bed mobility: Needs Assistance Bed Mobility: Supine to Sit;Sit to Supine     Supine to sit: Min guard;HOB elevated Sit to supine: Min guard        Transfers Overall transfer level: Needs assistance Equipment used: None Transfers: Lateral/Scoot Transfers          Lateral/Scoot Transfers: Min guard      Balance Overall balance assessment: Needs assistance Sitting-balance support: Feet supported;No upper extremity supported Sitting balance-Leahy Scale: Good Sitting balance - Comments: Good static sitting balance at EOB                                   ADL either performed or assessed with clinical judgement   ADL Overall ADL's : Needs assistance/impaired     Grooming: Wash/dry hands;Wash/dry face;Supervision/safety;Set up;Bed level   Upper Body Bathing: Set up;Supervision/ safety;Bed level Upper Body Bathing Details (indicate cue type and reason): Increased time required and cues for attention     Upper Body Dressing : Supervision/safety;Set up;Bed level Upper Body Dressing Details (indicate cue type and reason): Increased time required and cues for attention Lower Body Dressing: Moderate assistance;Bed level Lower Body Dressing Details (indicate cue type and reason): To don/doff socks. Pt able to doff socks but required assistance to don in setting of fatigue                               Cognition Arousal/Alertness: Awake/alert Behavior During Therapy: [facility] for  tasks assessed/performed Overall Cognitive Status: Within Functional Limits for tasks assessed                                 General Comments: Requires increased processing time. MIN cues for sequencing in setting of decreased sustained attention with bed-level ADLs              General Comments      Pertinent Vitals/ Pain       Pain Assessment: No/denies pain         Frequency  Min  2X/week        Progress Toward Goals  OT Goals(current goals can now be found in the care plan section)  Progress towards OT goals: Progressing toward goals  Acute Rehab OT Goals Patient Stated Goal: to go home OT Goal Formulation: With patient/family Time For Goal Achievement: 08/26/20 Potential to Achieve Goals: Good  Plan Discharge plan remains appropriate;Frequency remains appropriate       AM-PAC OT "6 Clicks" Daily Activity     Outcome Measure   Help from another person eating meals?: None Help from another person taking care of personal grooming?: A Little Help from another person toileting, which includes using toliet, bedpan, or urinal?: A Little Help from another person bathing (including washing, rinsing, drying)?: A Little Help from another person to put on and taking off regular upper body clothing?: A Little Help from another person to put on and taking off regular lower body clothing?: A Lot 6 Click Score: 18    End of Session    OT Visit Diagnosis: Unsteadiness on feet (R26.81);Other abnormalities of gait and mobility (R26.89)   Activity Tolerance Treatment limited secondary to medical complications (Comment) (limited by positive orthostatic vitals; RN aware)   Patient Left in bed;with bed alarm set;with call bell/phone within reach   Nurse Communication Mobility status;Other (comment) (orthostatic vitals)        Time: 4235-3614 OT Time Calculation (min): 44 min  Charges: OT General Charges $OT Visit: 1 Visit OT Treatments $Self Care/Home Management : 38-52 mins  Fredirick Maudlin, OTR/L [place]

## 2020-08-18 NOTE — Progress Notes (Signed)
PROGRESS NOTE    Wilena Tyndall  UKG:[id number] DOB: [birth year] DOA: 08/11/2020 PCP: Donnie Coffin, MD   Brief Narrative:  64/F with history of COPD, CAD/CABG in 2016, atrial flutter/fibrillation on Eliquis, type 2 diabetes mellitus, ongoing tobacco abuse, PAD with chronic left foot wound, hypertension, dyslipidemia, obesity was brought to the ED after a fall on the bus, patient reports shortness of breath and cough for a few days prior when EMS arrived she was noted to be hypoxic with A. fib RVR, in the ED she was placed on BiPAP for acute respiratory acidosis, chest x-ray noted bilateral pulmonary infiltrates, she was febrile to 100.6 and CT chest noted bilateral effusions and airspace disease. -Admitted to the ICU with hypoxic respiratory failure from aspiration pneumonia and acute on chronic systolic CHF, improved with antibiotics and diuretics, transferred to the floor after improvement -4/18 became septic and lethargic from catheter related UTI, Foley catheter removed, started antibiotics, slowly improving again  Has been improving over interval.  IVs discontinued inadvertently on 4/19.  Discussed with patient, she is amenable to replacement.   Assessment & Plan:   Active Problems:   Acute respiratory failure with hypoxia (HCC)  Sepsis, catheter related UTI Metabolic encephalopathy secondary to above -4/17 we suspected her drowsiness was secondary to the morphine she got on 4/17 a.m., 4/18 she remained lethargic and became confused, WBC up to 30 K, urinalysis abnormal, she had a foley placed on 4/16 in ICU for retention,  -Discontinued Foley catheter 4/18, started IV ceftriaxone -Mental status improving, answers questions more appropriately Plan: Replace IV access Continue Rocephin Restart IV fluids for the next 12 hours Therapy evaluations, home health ordered Will recommend a 5 to 7-day antibiotic course.  Transition to oral at time of discharge  Acute hypoxic respiratory  failure Aspiration pneumonia Acute on chronic combined systolic and diastolic CHF -Clinically improving, required BiPAP briefly on admission -All cultures are negative, transitioned from IV Zosyn to Augmentin, completed 5 days of antibiotics for this, yesterday switched to ceftriaxone for UTI -SLP evaluation completed, mild aspiration risk, recommended dysphagia 3 diet -Diuresed with IV Lasix, she is 16 L negative,  -Improved from this standpoint  Acute on chronic systolic and diastolic CHF -Last echo in 11/21 with EF of 40% and grade 1 diastolic dysfunction -Has bilateral pleural effusions on imaging which could be parapneumonic as well, diuresed aggressively with IV Lasix, 16 L negative, changed to p.o.  -Repeat echo with EF of 30-35% -continue Toprol, recommend outpatient consideration of Entresto -holding diuretics in setting of sepsis, UTI -Follow-up with cardiology Dr. Rockey Situ in 1 to 2 weeks  AKI on CKD 3a -Baseline creatinine around 1.2 -Diuretics currently on hold  Abnormal LFTs -Due to passive hepatic congestion, CT abdomen pelvis noted unremarkable gallbladder, liver etc. -Improving  Paroxysmal atrial fibrillation with RVR -Continue Eliquis and Lopressor  Type 2 diabetes mellitus with hyperglycemia -Continue sliding scale insulin, hemoglobin A1c was 8.0 in January  Chronic left foot wound -Does not appear infected at this time, follows at the wound clinic  Ambulatory dysfunction, frequent falls -PT OT eval Home health    DVT prophylaxis: Eliquis Code Status: Full Family Communication: Sister at bedside Disposition Plan: Status is: Inpatient  Remains inpatient appropriate because:Altered mental status and Inpatient level of care appropriate due to severity of illness   Dispo:  Patient From: Home  Planned Disposition: Home with Health Care Svc  Medically stable for discharge: No    Downtrending white count.  Encephalopathy improved.  Tentative plan  discharge home 4/21     Level of care: Progressive Cardiac  Consultants:   None   Procedures: None  Antimicrobials:   Ceftriaxone   Subjective: Seen and examined.  Lethargic appearing but sitting up in bed.  Answers all questions appropriately.  No pain complaints.  Objective: Vitals:   08/18/20 0606 08/18/20 0800 08/18/20 1148 08/18/20 1300  BP: (!) 152/87 136/72 (!) 158/97   Pulse: (!) 122 (!) 124 (!) 58   Resp: 20 18 17    Temp: 97.9 F (36.6 C) 97.6 F (36.4 C) 98.3 F (36.8 C)   TempSrc: Oral Oral Oral   SpO2: 100% 96% 93%   Weight:    102.2 kg  Height:        Intake/Output Summary (Last 24 hours) at 08/18/2020 1446 Last data filed at 08/18/2020 1335 Gross per 24 hour  Intake 1684.06 ml  Output 600 ml  Net 1084.06 ml   Filed Weights   08/11/20 1355 08/17/20 1351 08/18/20 1300  Weight: 113 kg 99.9 kg 102.2 kg    Examination:  General exam: No acute distress.  Appears chronically ill Respiratory system: Clear to auscultation. Respiratory effort normal. Cardiovascular system: S1 & S2 heard, RRR. No JVD, murmurs, rubs, gallops or clicks. No pedal edema. Gastrointestinal system: Abdomen is nondistended, soft and nontender. No organomegaly or masses felt. Normal bowel sounds heard. Central nervous system: Alert and oriented. No focal neurological deficits. Extremities: Symmetric 5 x 5 power. Skin: No rashes, lesions or ulcers Psychiatry: Judgement and insight appear impaired. Mood & affect flattened.     Data Reviewed: I have personally reviewed following labs and imaging studies  CBC: Recent Labs  Lab 08/12/20 0411 08/13/20 0408 08/14/20 0411 08/16/20 0901 08/17/20 0410 08/18/20 0526  WBC 13.1* 9.7 8.6 30.8* 25.2* 17.9*  NEUTROABS 11.1*  --   --   --   --   --   HGB 13.6 12.7 12.9 14.9 15.0 14.6  HCT 39.4 36.9 38.8 45.7 45.3 44.4  MCV 74.1* 73.8* 76.1* 75.7* 74.8* 75.6*  PLT 331 306 320 317 303 009   Basic Metabolic Panel: Recent Labs   Lab 08/12/20 0411 08/13/20 0408 08/15/20 0433 08/16/20 0634 08/16/20 0900 08/17/20 0410 08/18/20 0526  NA 140   < > 140 141 141 145 146*  K 3.5   < > 3.7 3.8 3.9 4.0 3.6  CL 101   < > 97* 99 98 101 106  CO2 28   < > 31 31 32 29 29  GLUCOSE 166*   < > 206* 252* 259* 216* 196*  BUN 16   < > 11 17 17 21 17   CREATININE 1.40*   < > 1.10* 1.06* 1.22* 1.13* 0.94  CALCIUM 8.9   < > 8.7* 9.0 9.1 8.8* 8.8*  MG 1.8  --   --  1.9  --   --   --   PHOS 3.4  --   --   --   --   --   --    < > = values in this interval not displayed.   GFR: Estimated Creatinine Clearance: 74.3 mL/min (by C-G formula based on SCr of 0.94 mg/dL). Liver Function Tests: Recent Labs  Lab 08/12/20 0411 08/14/20 0411 08/16/20 0900 08/17/20 0410  AST 77* 40 24 30  ALT 61* 48* 32 31  ALKPHOS 136* 117 115 131*  BILITOT 3.1* 1.7* 1.3* 1.1  PROT 7.1 6.8 7.3 6.7  ALBUMIN 3.0* 2.6* 2.7* 2.4*  No results for input(s): LIPASE, AMYLASE in the last 168 hours. Recent Labs  Lab 08/16/20 0901  AMMONIA 10   Coagulation Profile: No results for input(s): INR, PROTIME in the last 168 hours. Cardiac Enzymes: No results for input(s): CKTOTAL, CKMB, CKMBINDEX, TROPONINI in the last 168 hours. BNP (last 3 results) No results for input(s): PROBNP in the last 8760 hours. HbA1C: No results for input(s): HGBA1C in the last 72 hours. CBG: Recent Labs  Lab 08/12/20 1112 08/12/20 1648 08/13/20 1758 08/13/20 2215 08/14/20 2157  GLUCAP 119* 184* 186* 199* 193*   Lipid Profile: No results for input(s): CHOL, HDL, LDLCALC, TRIG, CHOLHDL, LDLDIRECT in the last 72 hours. Thyroid Function Tests: No results for input(s): TSH, T4TOTAL, FREET4, T3FREE, THYROIDAB in the last 72 hours. Anemia Panel: No results for input(s): VITAMINB12, FOLATE, FERRITIN, TIBC, IRON, RETICCTPCT in the last 72 hours. Sepsis Labs: Recent Labs  Lab 08/11/20 1602 08/12/20 0411  PROCALCITON  --  0.74  LATICACIDVEN 2.8* 1.8    Recent Results  (from the past 240 hour(s))  Blood culture (routine x 2)     Status: None   Collection Time: 08/11/20  1:56 PM   Specimen: BLOOD  Result Value Ref Range Status   Specimen Description BLOOD LEFT ANTECUBITAL  Final   Special Requests   Final    BOTTLES DRAWN AEROBIC AND ANAEROBIC Blood Culture adequate volume   Culture   Final    NO GROWTH 5 DAYS Performed at [facility], [address] [place], [place] [address]    Report Status 08/16/2020 FINAL  Final  Resp Panel by RT-PCR (Flu A&B, Covid) Nasopharyngeal Swab     Status: None   Collection Time: 08/11/20  1:56 PM   Specimen: Nasopharyngeal Swab; Nasopharyngeal(NP) swabs in vial transport medium  Result Value Ref Range Status   SARS Coronavirus 2 by RT PCR NEGATIVE NEGATIVE Final    Comment: (NOTE) SARS-CoV-2 target nucleic acids are NOT DETECTED.  The SARS-CoV-2 RNA is generally detectable in upper respiratory specimens during the acute phase of infection. The lowest concentration of SARS-CoV-2 viral copies this assay can detect is 138 copies/mL. A negative result does not preclude SARS-Cov-2 infection and should not be used as the sole basis for treatment or other patient management decisions. A negative result may occur with  improper specimen collection/handling, submission of specimen other than nasopharyngeal swab, presence of viral mutation(s) within the areas targeted by this assay, and inadequate number of viral copies(<138 copies/mL). A negative result must be combined with clinical observations, patient history, and epidemiological information. The expected result is Negative.  Fact Sheet for Patients:  EntrepreneurPulse.com.au  Fact Sheet for Healthcare Providers:  IncredibleEmployment.be  This test is no t yet approved or cleared by the [place] FDA and  has been authorized for detection and/or diagnosis of SARS-CoV-2 by FDA under an Emergency Use Authorization  (EUA). This EUA will remain  in effect (meaning this test can be used) for the duration of the COVID-19 declaration under Section 564(b)(1) of the Act, 21 U.S.C.section 360bbb-3(b)(1), unless the authorization is terminated  or revoked sooner.       Influenza A by PCR NEGATIVE NEGATIVE Final   Influenza B by PCR NEGATIVE NEGATIVE Final    Comment: (NOTE) The Xpert Xpress SARS-CoV-2/FLU/RSV plus assay is intended as an aid in the diagnosis of influenza from Nasopharyngeal swab specimens and should not be used as a sole basis for treatment. Nasal washings and aspirates are unacceptable for Xpert  Xpress SARS-CoV-2/FLU/RSV testing.  Fact Sheet for Patients: EntrepreneurPulse.com.au  Fact Sheet for Healthcare Providers: IncredibleEmployment.be  This test is not yet approved or cleared by the [place] FDA and has been authorized for detection and/or diagnosis of SARS-CoV-2 by FDA under an Emergency Use Authorization (EUA). This EUA will remain in effect (meaning this test can be used) for the duration of the COVID-19 declaration under Section 564(b)(1) of the Act, 21 U.S.C. section 360bbb-3(b)(1), unless the authorization is terminated or revoked.  Performed at [facility], [address] [place], [place] [address]   Blood culture (routine x 2)     Status: None   Collection Time: 08/11/20  1:57 PM   Specimen: BLOOD  Result Value Ref Range Status   Specimen Description BLOOD RIGHT ANTECUBITAL  Final   Special Requests   Final    BOTTLES DRAWN AEROBIC AND ANAEROBIC Blood Culture results may not be optimal due to an excessive volume of blood received in culture bottles   Culture   Final    NO GROWTH 5 DAYS Performed at [facility], [address] [place], [place] [address]    Report Status 08/16/2020 FINAL  Final  MRSA PCR Screening     Status: None   Collection Time: 08/11/20  9:52 PM   Specimen: Nasopharyngeal   Result Value Ref Range Status   MRSA by PCR NEGATIVE NEGATIVE Final    Comment:        The GeneXpert MRSA Assay (FDA approved for NASAL specimens only), is one component of a comprehensive MRSA colonization surveillance program. It is not intended to diagnose MRSA infection nor to guide or monitor treatment for MRSA infections. Performed at [facility], [address] [place], [place] [address]   Urine Culture     Status: None   Collection Time: 08/16/20 12:13 PM   Specimen: Urine, Random  Result Value Ref Range Status   Specimen Description   Final    URINE, RANDOM Performed at [facility], [address], [place], [place] [address]    Special Requests   Final    NONE Performed at [facility], [address], [place], [place] [address]    Culture   Final    NO GROWTH Performed at [facility], [place] [address], [place], [place] [address]    Report Status 08/18/2020 FINAL  Final         Radiology Studies: No results found.      Scheduled Meds: . acetaminophen  1,000 mg Oral Once  . apixaban  5 mg Oral BID  . chlorhexidine  15 mL Mouth Rinse BID  . Chlorhexidine Gluconate Cloth  6 each Topical Daily  . diltiazem  180 mg Oral Daily  . DULoxetine  30 mg Oral Daily  . ferrous sulfate  325 mg Oral Daily  . mouth rinse  15 mL Mouth Rinse q12n4p  . metoprolol tartrate  50 mg Oral BID  . oxybutynin  5 mg Oral BID  . pantoprazole  40 mg Oral Daily   Continuous Infusions: . cefTRIAXone (ROCEPHIN)  IV 1 g (08/18/20 1252)     LOS: 7 days    Time spent: 25 minutes    Sidney Ace, MD [facility] Pager [phone]  If 7PM-7AM, please contact night-coverage 08/18/2020, 2:46 PM

## 2020-08-18 NOTE — Progress Notes (Signed)
Pt pulled out IV, only access, 2 RN's attempted IV placement, no IV team at night, MD notified. Will consult IV team for A.M.

## 2020-08-18 NOTE — Progress Notes (Signed)
   08/16/20 0453  Assess: MEWS Score  Temp 97.8 F (36.6 C)  BP (!) 143/80  ECG Heart Rate (!) 135  Resp 20  SpO2 97 %  O2 Device Nasal Cannula  O2 Flow Rate (L/min) 3.5 L/min  Assess: MEWS Score  MEWS Temp 0  MEWS Systolic 0  MEWS Pulse 3  MEWS RR 0  MEWS LOC 0  MEWS Score 3  MEWS Score Color Yellow  Assess: if the MEWS score is Yellow or Red  Were vital signs taken at a resting state? Yes  Focused Assessment Change from prior assessment (see assessment flowsheet)  Early Detection of Sepsis Score *See Row Information* Low  MEWS guidelines implemented *See Row Information* Yes  Treat  MEWS Interventions Administered scheduled meds/treatments  Pain Scale 0-10  Pain Score 0  Take Vital Signs  Increase Vital Sign Frequency  Yellow: Q 2hr X 2 then Q 4hr X 2, if remains yellow, continue Q 4hrs  Escalate  MEWS: Escalate Yellow: discuss with charge nurse/RN and consider discussing with provider and RRT  Notify: Charge Nurse/RN  Name of Charge Nurse/RN Notified Hinton Lovely, RN  Date Charge Nurse/RN Notified 08/16/20  Time Charge Nurse/RN Notified [phone]  Document  Patient Outcome Other (Comment) (contnue to monitor)  Inserted for [employer] RN

## 2020-08-18 NOTE — Progress Notes (Signed)
VAST received consult to obtain IV access after patient pulled out her only IV. VAST RN has responded to room x2 now; both times patient stating she needs to use the bathroom. Spoke with patient's nurse who stated she did use the bathroom previously, but now believes it might be a way to avoid getting an IV. Both nurses informed patient that when VAST RN returns to room the third time, an IV has to be placed. Pt responded, "what if I have to use the bathroom again?" VAST RN to return shortly for IV placement.

## 2020-08-18 NOTE — Telephone Encounter (Signed)
Pt currently hospitalized.  Need to advise arrival time for ablation has been moved forward one hour.  Arrival time 7:30 am on May 26.    Will continue to monitor.

## 2020-08-19 LAB — CBC WITH DIFFERENTIAL/PLATELET
Abs Immature Granulocytes: 0.14 10*3/uL — ABNORMAL HIGH (ref 0.00–0.07)
Basophils Absolute: 0 10*3/uL (ref 0.0–0.1)
Basophils Relative: 0 %
Eosinophils Absolute: 0 10*3/uL (ref 0.0–0.5)
Eosinophils Relative: 0 %
HCT: 38 % (ref 36.0–46.0)
Hemoglobin: 12.8 g/dL (ref 12.0–15.0)
Immature Granulocytes: 1 %
Lymphocytes Relative: 7 %
Lymphs Abs: 0.9 10*3/uL (ref 0.7–4.0)
MCH: 24.6 pg — ABNORMAL LOW (ref 26.0–34.0)
MCHC: 33.7 g/dL (ref 30.0–36.0)
MCV: 73.1 fL — ABNORMAL LOW (ref 80.0–100.0)
Monocytes Absolute: 1.3 10*3/uL — ABNORMAL HIGH (ref 0.1–1.0)
Monocytes Relative: 10 %
Neutro Abs: 11 10*3/uL — ABNORMAL HIGH (ref 1.7–7.7)
Neutrophils Relative %: 82 %
Platelets: 323 10*3/uL (ref 150–400)
RBC: 5.2 MIL/uL — ABNORMAL HIGH (ref 3.87–5.11)
RDW: 21.3 % — ABNORMAL HIGH (ref 11.5–15.5)
Smear Review: NORMAL
WBC: 13.4 10*3/uL — ABNORMAL HIGH (ref 4.0–10.5)
nRBC: 0.1 % (ref 0.0–0.2)

## 2020-08-19 LAB — BASIC METABOLIC PANEL
Anion gap: 10 (ref 5–15)
BUN: 12 mg/dL (ref 8–23)
CO2: 27 mmol/L (ref 22–32)
Calcium: 8.2 mg/dL — ABNORMAL LOW (ref 8.9–10.3)
Chloride: 102 mmol/L (ref 98–111)
Creatinine, Ser: 0.93 mg/dL (ref 0.44–1.00)
GFR, Estimated: >60 mL/min (ref >60)
Glucose, Bld: 235 mg/dL — ABNORMAL HIGH (ref 70–99)
Potassium: 3.4 mmol/L — ABNORMAL LOW (ref 3.5–5.1)
Sodium: 139 mmol/L (ref 135–145)

## 2020-08-19 MED ORDER — POTASSIUM CHLORIDE CRYS ER 20 MEQ PO TBCR
40.0000 meq | EXTENDED_RELEASE_TABLET | Freq: Once | ORAL | Status: AC
  Administered 2020-08-19: 40 meq via ORAL
  Filled 2020-08-19: qty 2

## 2020-08-19 MED ORDER — AMOXICILLIN-POT CLAVULANATE 875-125 MG PO TABS: 1.0000 | ORAL_TABLET | Freq: Two times a day (BID) | ORAL | 0 refills | Status: DC

## 2020-08-19 NOTE — Plan of Care (Signed)

## 2020-08-19 NOTE — Discharge Summary (Signed)
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 collection/handling, submission of specimen other than nasopharyngeal swab, presence of viral mutation(s) within the areas targeted by this assay, and inadequate number of viral copies(<138 copies/mL). A negative result must be combined with clinical observations, patient history, and epidemiological information. The expected result is Negative.  Fact Sheet for Patients:  EntrepreneurPulse.com.au  Fact Sheet for Healthcare Providers:  IncredibleEmployment.be  This test is no t yet approved or cleared by the [place] FDA and  has been authorized for detection and/or diagnosis of SARS-CoV-2 by FDA under an Emergency Use Authorization (EUA). This EUA will remain  in effect (meaning this test can be used) for the duration of the COVID-19 declaration  under Section 564(b)(1) of the Act, 21 U.S.C.section 360bbb-3(b)(1), unless the authorization is terminated  or revoked sooner.       Influenza A by PCR NEGATIVE NEGATIVE Final   Influenza B by PCR NEGATIVE NEGATIVE Final    Comment: (NOTE) The Xpert Xpress SARS-CoV-2/FLU/RSV plus assay is intended as an aid in the diagnosis of influenza from Nasopharyngeal swab specimens and should not be used as a sole basis for treatment. Nasal washings and aspirates are unacceptable for Xpert Xpress SARS-CoV-2/FLU/RSV testing.  Fact Sheet for Patients: EntrepreneurPulse.com.au  Fact Sheet for Healthcare Providers: IncredibleEmployment.be  This test is not yet approved or cleared by the [place] FDA and has been authorized for detection and/or diagnosis of SARS-CoV-2 by FDA under an Emergency Use Authorization (EUA). This EUA will remain in effect (meaning this test can be used) for the duration of the COVID-19 declaration under Section 564(b)(1) of the Act, 21 U.S.C. section 360bbb-3(b)(1), unless the authorization is terminated or revoked.  Performed at [facility], [address] [place], [place] [address]   Blood culture (routine x 2)     Status: None   Collection Time: 08/11/20  1:57 PM   Specimen: BLOOD  Result Value Ref Range Status   Specimen Description BLOOD RIGHT ANTECUBITAL  Final   Special Requests   Final    BOTTLES DRAWN AEROBIC AND ANAEROBIC Blood Culture results may not be optimal due to an excessive volume of blood received in culture bottles   Culture   Final    NO GROWTH 5 DAYS Performed at [facility], [place]  [address], [place], [place] [address]    Report Status 08/16/2020 FINAL  Final  MRSA PCR Screening     Status: None   Collection Time: 08/11/20  9:52 PM   Specimen: Nasopharyngeal  Result Value Ref Range Status   MRSA by PCR NEGATIVE NEGATIVE Final    Comment:        The GeneXpert MRSA Assay  (FDA approved for NASAL specimens only), is one component of a comprehensive MRSA colonization surveillance program. It is not intended to diagnose MRSA infection nor to guide or monitor treatment for MRSA infections. Performed at [facility], [address] [place], [place] [address]   Urine Culture     Status: None   Collection Time: 08/16/20 12:13 PM   Specimen: Urine, Random  Result Value Ref Range Status   Specimen Description   Final    URINE, RANDOM Performed at [facility], [address] [place], [place] [address]    Special Requests   Final    NONE Performed at [facility], [address], [place], [place] [address]    Culture   Final    NO GROWTH Performed at [facility], [place] [address] [place], [place] [address]    Report Status 08/18/2020 FINAL  Final     Labs: BNP (last 3 results) Recent Labs    08/11/20 1356  BNP 160.7*   Basic Metabolic Panel: Recent Labs  Lab 08/16/20 0634 08/16/20 0900 08/17/20 0410 08/18/20 0526 08/19/20 0735  NA 141 141 145 146* 139  K 3.8 3.9 4.0 3.6 3.4*  CL 99 98 101 106 102  CO2 31 32 29 29 27   GLUCOSE 252* 259* 216* 196* 235*  BUN 17 17 21 17 12   CREATININE 1.06* 1.22* 1.13* 0.94 0.93  CALCIUM 9.0 9.1 8.8* 8.8* 8.2*  MG 1.9  --   --   --   --    Liver Function Tests: Recent Labs  Lab 08/14/20 0411 08/16/20 0900 08/17/20 0410  AST 40 24 30  ALT 48* 32 31  ALKPHOS 117 115 131*  BILITOT 1.7* 1.3* 1.1  PROT 6.8 7.3 6.7  ALBUMIN 2.6* 2.7* 2.4*   No results for input(s): LIPASE, AMYLASE in the last 168 hours. Recent Labs  Lab 08/16/20 0901  AMMONIA 10   CBC: Recent Labs  Lab 08/14/20 0411 08/16/20 0901 08/17/20 0410 08/18/20 0526 08/19/20 0735  WBC 8.6 30.8* 25.2* 17.9* 13.4*  NEUTROABS  --   --   --   --  11.0*  HGB 12.9 14.9 15.0 14.6 12.8  HCT 38.8 45.7 45.3 44.4 38.0  MCV 76.1* 75.7* 74.8* 75.6* 73.1*  PLT 320 317 303 303 323   Cardiac  Enzymes: No results for input(s): CKTOTAL, CKMB, CKMBINDEX, TROPONINI in the last 168 hours. BNP: Invalid input(s): POCBNP CBG: Recent Labs  Lab 08/12/20 1648 08/13/20 1758 08/13/20 2215 08/14/20 2157  GLUCAP 184* 186* 199* 193*   D-Dimer No results for input(s): DDIMER in the last 72 hours. Hgb A1c No results for input(s): HGBA1C in the last 72 hours. Lipid Profile No results for input(s): CHOL, HDL, LDLCALC, TRIG, CHOLHDL, LDLDIRECT in the last 72 hours. Thyroid function studies No results for input(s): TSH, T4TOTAL, T3FREE, THYROIDAB in the last 72 hours.  Invalid input(s): FREET3 Anemia work up No results for input(s): VITAMINB12, FOLATE, FERRITIN, TIBC, IRON, RETICCTPCT in the last 72 hours. Urinalysis    Component Value Date/Time   COLORURINE AMBER (A) 08/16/2020 3710  APPEARANCEUR HAZY (A) 08/16/2020 0957   APPEARANCEUR Clear 10/10/2016 1911   LABSPEC 1.019 08/16/2020 0957   LABSPEC 1.031 06/01/2014 2258   PHURINE 6.0 08/16/2020 0957   GLUCOSEU NEGATIVE 08/16/2020 0957   GLUCOSEU 50 mg/dL 06/01/2014 2258   HGBUR LARGE (A) 08/16/2020 0957   BILIRUBINUR NEGATIVE 08/16/2020 0957   BILIRUBINUR Negative 10/10/2016 1911   BILIRUBINUR Negative 06/01/2014 2258   KETONESUR NEGATIVE 08/16/2020 0957   PROTEINUR 100 (A) 08/16/2020 0957   UROBILINOGEN 1.0 06/07/2014 0952   NITRITE NEGATIVE 08/16/2020 0957   LEUKOCYTESUR NEGATIVE 08/16/2020 0957   LEUKOCYTESUR Negative 06/01/2014 2258   Sepsis Labs Invalid input(s): PROCALCITONIN,  WBC,  LACTICIDVEN Microbiology Recent Results (from the past 240 hour(s))  Blood culture (routine x 2)     Status: None   Collection Time: 08/11/20  1:56 PM   Specimen: BLOOD  Result Value Ref Range Status   Specimen Description BLOOD LEFT ANTECUBITAL  Final   Special Requests   Final    BOTTLES DRAWN AEROBIC AND ANAEROBIC Blood Culture adequate volume   Culture   Final    NO GROWTH 5 DAYS Performed at [facility], [address] [place], [place] [address]    Report Status 08/16/2020 FINAL  Final  Resp Panel by RT-PCR (Flu A&B, Covid) Nasopharyngeal Swab     Status: None   Collection Time: 08/11/20  1:56 PM   Specimen: Nasopharyngeal Swab; Nasopharyngeal(NP) swabs in vial transport medium  Result Value Ref Range Status   SARS Coronavirus 2 by RT PCR NEGATIVE NEGATIVE Final    Comment: (NOTE) SARS-CoV-2 target nucleic acids are NOT DETECTED.  The SARS-CoV-2 RNA is generally detectable in upper respiratory specimens during the acute phase of infection. The lowest concentration of SARS-CoV-2 viral copies this assay can detect is 138 copies/mL. A negative result does not preclude SARS-Cov-2 infection and should not be used as the sole basis for treatment or other patient management decisions. A negative result may occur with  improper specimen collection/handling, submission of specimen other than nasopharyngeal swab, presence of viral mutation(s) within the areas targeted by this assay, and inadequate number of viral copies(<138 copies/mL). A negative result must be combined with clinical observations, patient history, and epidemiological information. The expected result is Negative.  Fact Sheet for Patients:  EntrepreneurPulse.com.au  Fact Sheet for Healthcare Providers:  IncredibleEmployment.be  This test is no t yet approved or cleared by the [place] FDA and  has been authorized for detection and/or diagnosis of SARS-CoV-2 by FDA under an Emergency Use Authorization (EUA). This EUA will remain  in effect (meaning this test can be used) for the duration of the COVID-19 declaration under Section 564(b)(1) of the Act, 21 U.S.C.section 360bbb-3(b)(1), unless the authorization is terminated  or revoked sooner.       Influenza A by PCR NEGATIVE NEGATIVE Final   Influenza B by PCR NEGATIVE NEGATIVE Final    Comment: (NOTE) The Xpert Xpress  SARS-CoV-2/FLU/RSV plus assay is intended as an aid in the diagnosis of influenza from Nasopharyngeal swab specimens and should not be used as a sole basis for treatment. Nasal washings and aspirates are unacceptable for Xpert Xpress SARS-CoV-2/FLU/RSV testing.  Fact Sheet for Patients: EntrepreneurPulse.com.au  Fact Sheet for Healthcare Providers: IncredibleEmployment.be  This test is not yet approved or cleared by the [place] FDA and has been authorized for detection and/or diagnosis of SARS-CoV-2 by FDA under an Emergency Use Authorization (EUA). This EUA will remain in effect (meaning this test  can be used) for the duration of the COVID-19 declaration under Section 564(b)(1) of the Act, 21 U.S.C. section 360bbb-3(b)(1), unless the authorization is terminated or revoked.  Performed at [facility], [address] [place], [place] [address]   Blood culture (routine x 2)     Status: None   Collection Time: 08/11/20  1:57 PM   Specimen: BLOOD  Result Value Ref Range Status   Specimen Description BLOOD RIGHT ANTECUBITAL  Final   Special Requests   Final    BOTTLES DRAWN AEROBIC AND ANAEROBIC Blood Culture results may not be optimal due to an excessive volume of blood received in culture bottles   Culture   Final    NO GROWTH 5 DAYS Performed at [facility], [address] [place], [place] [address]    Report Status 08/16/2020 FINAL  Final  MRSA PCR Screening     Status: None   Collection Time: 08/11/20  9:52 PM   Specimen: Nasopharyngeal  Result Value Ref Range Status   MRSA by PCR NEGATIVE NEGATIVE Final    Comment:        The GeneXpert MRSA Assay (FDA approved for NASAL specimens only), is one component of a comprehensive MRSA colonization surveillance program. It is not intended to diagnose MRSA infection nor to guide or monitor treatment for MRSA infections. Performed at [facility], [address] [place], [place] [address]   Urine Culture     Status: None   Collection Time: 08/16/20 12:13 PM   Specimen: Urine, Random  Result Value Ref Range Status   Specimen Description   Final    URINE, RANDOM Performed at [facility], [address], [place], [place] [address]    Special Requests   Final    NONE Performed at [facility], [address], [place], [place] [address]    Culture   Final    NO GROWTH Performed at [facility], [place] [address], [place], [place] [address]    Report Status 08/18/2020 FINAL  Final     Time coordinating discharge: Over 30 minutes  SIGNED:   Sidney Ace, MD  [facility] 08/19/2020, 1:50 PM Pager   If 7PM-7AM, please contact night-coverage

## 2020-08-19 NOTE — Progress Notes (Signed)
Patient discharged per orders. PIV/tele removed from patient. AVS reviewed with patient and family. Taken down to medical mall via wheelchair by volunteer service.

## 2020-08-22 ENCOUNTER — Observation Stay
Admission: EM | Admit: 2020-08-22 | Discharge: 2020-08-25 | Disposition: A | Discharge status: Home / Self Care | Payer: Medicare HMO | Attending: Internal Medicine | Admitting: Internal Medicine

## 2020-08-22 ENCOUNTER — Emergency Department: Payer: Medicare HMO

## 2020-08-22 ENCOUNTER — Other Ambulatory Visit: Payer: Self-pay

## 2020-08-22 DIAGNOSIS — IMO0002 Reserved for concepts with insufficient information to code with codable children: Secondary | ICD-10-CM | POA: Diagnosis present

## 2020-08-22 DIAGNOSIS — I10 Essential (primary) hypertension: Secondary | ICD-10-CM

## 2020-08-22 DIAGNOSIS — R296 Repeated falls: Secondary | ICD-10-CM

## 2020-08-22 DIAGNOSIS — I4892 Unspecified atrial flutter: Principal | ICD-10-CM | POA: Insufficient documentation

## 2020-08-22 DIAGNOSIS — I130 Hypertensive heart and chronic kidney disease with heart failure and stage 1 through stage 4 chronic kidney disease, or unspecified chronic kidney disease: Secondary | ICD-10-CM | POA: Insufficient documentation

## 2020-08-22 DIAGNOSIS — D509 Iron deficiency anemia, unspecified: Secondary | ICD-10-CM | POA: Diagnosis not present

## 2020-08-22 DIAGNOSIS — W19XXXA Unspecified fall, initial encounter: Secondary | ICD-10-CM

## 2020-08-22 DIAGNOSIS — N1831 Chronic kidney disease, stage 3a: Secondary | ICD-10-CM | POA: Diagnosis not present

## 2020-08-22 DIAGNOSIS — Z20822 Contact with and (suspected) exposure to covid-19: Secondary | ICD-10-CM | POA: Diagnosis not present

## 2020-08-22 DIAGNOSIS — Z79899 Other long term (current) drug therapy: Secondary | ICD-10-CM | POA: Diagnosis not present

## 2020-08-22 DIAGNOSIS — Z87891 Personal history of nicotine dependence: Secondary | ICD-10-CM | POA: Diagnosis not present

## 2020-08-22 DIAGNOSIS — E1322 Other specified diabetes mellitus with diabetic chronic kidney disease: Secondary | ICD-10-CM | POA: Diagnosis present

## 2020-08-22 DIAGNOSIS — K219 Gastro-esophageal reflux disease without esophagitis: Secondary | ICD-10-CM | POA: Diagnosis present

## 2020-08-22 DIAGNOSIS — I5022 Chronic systolic (congestive) heart failure: Secondary | ICD-10-CM | POA: Diagnosis not present

## 2020-08-22 DIAGNOSIS — I25118 Atherosclerotic heart disease of native coronary artery with other forms of angina pectoris: Secondary | ICD-10-CM | POA: Diagnosis not present

## 2020-08-22 DIAGNOSIS — I4891 Unspecified atrial fibrillation: Secondary | ICD-10-CM | POA: Diagnosis present

## 2020-08-22 DIAGNOSIS — Z951 Presence of aortocoronary bypass graft: Secondary | ICD-10-CM | POA: Diagnosis not present

## 2020-08-22 DIAGNOSIS — I480 Paroxysmal atrial fibrillation: Secondary | ICD-10-CM

## 2020-08-22 DIAGNOSIS — N183 Chronic kidney disease, stage 3 unspecified: Secondary | ICD-10-CM | POA: Diagnosis present

## 2020-08-22 DIAGNOSIS — Y92009 Unspecified place in unspecified non-institutional (private) residence as the place of occurrence of the external cause: Secondary | ICD-10-CM

## 2020-08-22 DIAGNOSIS — E1129 Type 2 diabetes mellitus with other diabetic kidney complication: Secondary | ICD-10-CM | POA: Diagnosis not present

## 2020-08-22 DIAGNOSIS — Z794 Long term (current) use of insulin: Secondary | ICD-10-CM | POA: Insufficient documentation

## 2020-08-22 DIAGNOSIS — F32A Depression, unspecified: Secondary | ICD-10-CM | POA: Diagnosis present

## 2020-08-22 DIAGNOSIS — Z7901 Long term (current) use of anticoagulants: Secondary | ICD-10-CM | POA: Insufficient documentation

## 2020-08-22 DIAGNOSIS — I2510 Atherosclerotic heart disease of native coronary artery without angina pectoris: Secondary | ICD-10-CM | POA: Diagnosis present

## 2020-08-22 DIAGNOSIS — I739 Peripheral vascular disease, unspecified: Secondary | ICD-10-CM | POA: Diagnosis present

## 2020-08-22 DIAGNOSIS — E785 Hyperlipidemia, unspecified: Secondary | ICD-10-CM | POA: Diagnosis present

## 2020-08-22 LAB — CBC WITH DIFFERENTIAL/PLATELET
Abs Immature Granulocytes: 0.48 10*3/uL — ABNORMAL HIGH (ref 0.00–0.07)
Basophils Absolute: 0.1 10*3/uL (ref 0.0–0.1)
Basophils Relative: 1 %
Eosinophils Absolute: 0.1 10*3/uL (ref 0.0–0.5)
Eosinophils Relative: 1 %
HCT: 39.7 % (ref 36.0–46.0)
Hemoglobin: 13.2 g/dL (ref 12.0–15.0)
Immature Granulocytes: 5 %
Lymphocytes Relative: 14 %
Lymphs Abs: 1.4 10*3/uL (ref 0.7–4.0)
MCH: 24.5 pg — ABNORMAL LOW (ref 26.0–34.0)
MCHC: 33.2 g/dL (ref 30.0–36.0)
MCV: 73.7 fL — ABNORMAL LOW (ref 80.0–100.0)
Monocytes Absolute: 1 10*3/uL (ref 0.1–1.0)
Monocytes Relative: 10 %
Neutro Abs: 7.1 10*3/uL (ref 1.7–7.7)
Neutrophils Relative %: 69 %
Platelets: 388 10*3/uL (ref 150–400)
RBC: 5.39 MIL/uL — ABNORMAL HIGH (ref 3.87–5.11)
RDW: 22.5 % — ABNORMAL HIGH (ref 11.5–15.5)
Smear Review: NORMAL
WBC: 10.1 10*3/uL (ref 4.0–10.5)
nRBC: 0 % (ref 0.0–0.2)

## 2020-08-22 LAB — COMPREHENSIVE METABOLIC PANEL
ALT: 46 U/L — ABNORMAL HIGH (ref 0–44)
AST: 50 U/L — ABNORMAL HIGH (ref 15–41)
Albumin: 2.4 g/dL — ABNORMAL LOW (ref 3.5–5.0)
Alkaline Phosphatase: 108 U/L (ref 38–126)
Anion gap: 11 (ref 5–15)
BUN: 17 mg/dL (ref 8–23)
CO2: 28 mmol/L (ref 22–32)
Calcium: 8.5 mg/dL — ABNORMAL LOW (ref 8.9–10.3)
Chloride: 98 mmol/L (ref 98–111)
Creatinine, Ser: 1.31 mg/dL — ABNORMAL HIGH (ref 0.44–1.00)
GFR, Estimated: 45 mL/min — ABNORMAL LOW (ref >60)
Glucose, Bld: 164 mg/dL — ABNORMAL HIGH (ref 70–99)
Potassium: 3.9 mmol/L (ref 3.5–5.1)
Sodium: 137 mmol/L (ref 135–145)
Total Bilirubin: 1.1 mg/dL (ref 0.3–1.2)
Total Protein: 6.8 g/dL (ref 6.5–8.1)

## 2020-08-22 LAB — TROPONIN I (HIGH SENSITIVITY)
Troponin I (High Sensitivity): 7 ng/L (ref <18)
Troponin I (High Sensitivity): 7 ng/L (ref <18)

## 2020-08-22 LAB — URINALYSIS, COMPLETE (UACMP) WITH MICROSCOPIC
Bacteria, UA: NONE SEEN
Bilirubin Urine: NEGATIVE
Glucose, UA: NEGATIVE mg/dL
Ketones, ur: NEGATIVE mg/dL
Nitrite: NEGATIVE
Protein, ur: NEGATIVE mg/dL
Specific Gravity, Urine: 1.01 (ref 1.005–1.030)
pH: 5 (ref 5.0–8.0)

## 2020-08-22 LAB — CBG MONITORING, ED
Glucose-Capillary: 223 mg/dL — ABNORMAL HIGH (ref 70–99)
Glucose-Capillary: 215 mg/dL — ABNORMAL HIGH (ref 70–99)

## 2020-08-22 LAB — MAGNESIUM: Magnesium: 1.7 mg/dL (ref 1.7–2.4)

## 2020-08-22 LAB — LACTIC ACID, PLASMA: Lactic Acid, Venous: 1.7 mmol/L (ref 0.5–1.9)

## 2020-08-22 LAB — GLUCOSE, CAPILLARY: Glucose-Capillary: 270 mg/dL — ABNORMAL HIGH (ref 70–99)

## 2020-08-22 LAB — BRAIN NATRIURETIC PEPTIDE: B Natriuretic Peptide: 99.8 pg/mL (ref 0.0–100.0)

## 2020-08-22 MED ORDER — INSULIN ASPART 100 UNIT/ML ~~LOC~~ SOLN
0.0000 [IU] | Freq: Three times a day (TID) | SUBCUTANEOUS | Status: DC
  Administered 2020-08-22: 3 [IU] via SUBCUTANEOUS
  Administered 2020-08-23: 2 [IU] via SUBCUTANEOUS
  Administered 2020-08-23: 3 [IU] via SUBCUTANEOUS
  Administered 2020-08-23: 7 [IU] via SUBCUTANEOUS
  Administered 2020-08-24 (×2): 2 [IU] via SUBCUTANEOUS
  Administered 2020-08-24: 1 [IU] via SUBCUTANEOUS
  Administered 2020-08-25 (×2): 2 [IU] via SUBCUTANEOUS
  Filled 2020-08-22 (×8): qty 1

## 2020-08-22 MED ORDER — TIZANIDINE HCL 4 MG PO TABS
4.0000 mg | ORAL_TABLET | Freq: Three times a day (TID) | ORAL | Status: DC
  Administered 2020-08-23 – 2020-08-25 (×7): 4 mg via ORAL
  Filled 2020-08-22 (×10): qty 1

## 2020-08-22 MED ORDER — OXYBUTYNIN CHLORIDE 5 MG PO TABS
5.0000 mg | ORAL_TABLET | Freq: Two times a day (BID) | ORAL | Status: DC
  Administered 2020-08-23 – 2020-08-25 (×5): 5 mg via ORAL
  Filled 2020-08-22 (×7): qty 1

## 2020-08-22 MED ORDER — DULOXETINE HCL 30 MG PO CPEP
60.0000 mg | ORAL_CAPSULE | Freq: Every day | ORAL | Status: DC
  Administered 2020-08-23 – 2020-08-25 (×3): 60 mg via ORAL
  Filled 2020-08-22 (×3): qty 2

## 2020-08-22 MED ORDER — PREGABALIN 75 MG PO CAPS
150.0000 mg | ORAL_CAPSULE | Freq: Three times a day (TID) | ORAL | Status: DC
  Administered 2020-08-22 – 2020-08-25 (×8): 150 mg via ORAL
  Filled 2020-08-22 (×8): qty 2

## 2020-08-22 MED ORDER — METOPROLOL TARTRATE 50 MG PO TABS
50.0000 mg | ORAL_TABLET | Freq: Two times a day (BID) | ORAL | Status: DC
  Administered 2020-08-22 – 2020-08-25 (×6): 50 mg via ORAL
  Filled 2020-08-22 (×6): qty 1

## 2020-08-22 MED ORDER — AMIODARONE HCL 200 MG PO TABS
200.0000 mg | ORAL_TABLET | Freq: Two times a day (BID) | ORAL | Status: DC
  Administered 2020-08-22 – 2020-08-25 (×6): 200 mg via ORAL
  Filled 2020-08-22 (×6): qty 1

## 2020-08-22 MED ORDER — DOCUSATE SODIUM 100 MG PO CAPS
100.0000 mg | ORAL_CAPSULE | Freq: Two times a day (BID) | ORAL | Status: DC
  Administered 2020-08-22 – 2020-08-25 (×6): 100 mg via ORAL
  Filled 2020-08-22 (×6): qty 1

## 2020-08-22 MED ORDER — ACETAMINOPHEN 325 MG PO TABS: 650.0000 mg | ORAL_TABLET | Freq: Four times a day (QID) | ORAL | Status: DC | PRN

## 2020-08-22 MED ORDER — ATORVASTATIN CALCIUM 80 MG PO TABS
80.0000 mg | ORAL_TABLET | Freq: Every day | ORAL | Status: DC
  Administered 2020-08-23 – 2020-08-24 (×2): 80 mg via ORAL
  Filled 2020-08-22 (×2): qty 1

## 2020-08-22 MED ORDER — AMLODIPINE BESYLATE 5 MG PO TABS
5.0000 mg | ORAL_TABLET | Freq: Every day | ORAL | Status: DC
  Administered 2020-08-23 – 2020-08-25 (×3): 5 mg via ORAL
  Filled 2020-08-22 (×3): qty 1

## 2020-08-22 MED ORDER — AMOXICILLIN-POT CLAVULANATE 875-125 MG PO TABS
1.0000 | ORAL_TABLET | Freq: Once | ORAL | Status: AC
  Administered 2020-08-22: 1 via ORAL
  Filled 2020-08-22: qty 1

## 2020-08-22 MED ORDER — METOPROLOL TARTRATE 50 MG PO TABS
50.0000 mg | ORAL_TABLET | Freq: Once | ORAL | Status: AC
  Administered 2020-08-22: 50 mg via ORAL
  Filled 2020-08-22: qty 1

## 2020-08-22 MED ORDER — METOPROLOL TARTRATE 5 MG/5ML IV SOLN
5.0000 mg | Freq: Once | INTRAVENOUS | Status: AC
  Administered 2020-08-22: 5 mg via INTRAVENOUS
  Filled 2020-08-22: qty 5

## 2020-08-22 MED ORDER — LIDOCAINE 5 % EX PTCH
1.0000 | MEDICATED_PATCH | Freq: Every day | CUTANEOUS | Status: DC | PRN
  Filled 2020-08-22: qty 1

## 2020-08-22 MED ORDER — AMIODARONE HCL 200 MG PO TABS
200.0000 mg | ORAL_TABLET | Freq: Once | ORAL | Status: AC
  Administered 2020-08-22: 200 mg via ORAL
  Filled 2020-08-22: qty 1

## 2020-08-22 MED ORDER — FERROUS SULFATE 325 (65 FE) MG PO TABS
325.0000 mg | ORAL_TABLET | Freq: Every day | ORAL | Status: DC
  Administered 2020-08-23 – 2020-08-25 (×3): 325 mg via ORAL
  Filled 2020-08-22 (×3): qty 1

## 2020-08-22 MED ORDER — ADULT MULTIVITAMIN W/MINERALS CH
2.0000 | ORAL_TABLET | Freq: Every day | ORAL | Status: DC
  Administered 2020-08-23 – 2020-08-25 (×3): 2 via ORAL
  Filled 2020-08-22 (×3): qty 2

## 2020-08-22 MED ORDER — FUROSEMIDE 40 MG PO TABS
40.0000 mg | ORAL_TABLET | Freq: Every day | ORAL | Status: DC
  Administered 2020-08-23 – 2020-08-24 (×2): 40 mg via ORAL
  Filled 2020-08-22 (×2): qty 1

## 2020-08-22 MED ORDER — FUROSEMIDE 40 MG PO TABS
40.0000 mg | ORAL_TABLET | Freq: Once | ORAL | Status: AC
  Administered 2020-08-22: 40 mg via ORAL
  Filled 2020-08-22: qty 1

## 2020-08-22 MED ORDER — OXYCODONE-ACETAMINOPHEN 5-325 MG PO TABS: 1.0000 | ORAL_TABLET | ORAL | Status: DC | PRN

## 2020-08-22 MED ORDER — VARENICLINE TARTRATE 0.5 MG PO TABS
1.0000 mg | ORAL_TABLET | Freq: Every day | ORAL | Status: DC
  Filled 2020-08-22 (×7): qty 1

## 2020-08-22 MED ORDER — SODIUM CHLORIDE 0.9% FLUSH
10.0000 mL | Freq: Two times a day (BID) | INTRAVENOUS | Status: DC
  Administered 2020-08-22 – 2020-08-25 (×6): 10 mL via INTRAVENOUS

## 2020-08-22 MED ORDER — PANTOPRAZOLE SODIUM 40 MG PO TBEC
40.0000 mg | DELAYED_RELEASE_TABLET | Freq: Every day | ORAL | Status: DC
  Administered 2020-08-23 – 2020-08-25 (×3): 40 mg via ORAL
  Filled 2020-08-22 (×3): qty 1

## 2020-08-22 MED ORDER — AMOXICILLIN-POT CLAVULANATE 875-125 MG PO TABS
1.0000 | ORAL_TABLET | Freq: Two times a day (BID) | ORAL | Status: AC
  Administered 2020-08-22: 1 via ORAL

## 2020-08-22 MED ORDER — METHOCARBAMOL 500 MG PO TABS
500.0000 mg | ORAL_TABLET | Freq: Once | ORAL | Status: AC
  Administered 2020-08-22: 500 mg via ORAL
  Filled 2020-08-22: qty 1

## 2020-08-22 MED ORDER — NIVEA EX CREA
1.0000 "application " | TOPICAL_CREAM | Freq: Two times a day (BID) | CUTANEOUS | Status: DC
  Filled 2020-08-22: qty 120

## 2020-08-22 MED ORDER — HYDRALAZINE HCL 20 MG/ML IJ SOLN: 5.0000 mg | INTRAMUSCULAR | Status: DC | PRN

## 2020-08-22 MED ORDER — VITAMIN D 25 MCG (1000 UNIT) PO TABS
2000.0000 [IU] | ORAL_TABLET | Freq: Every day | ORAL | Status: DC
  Administered 2020-08-23 – 2020-08-25 (×3): 2000 [IU] via ORAL
  Filled 2020-08-22 (×3): qty 2

## 2020-08-22 MED ORDER — APIXABAN 5 MG PO TABS
5.0000 mg | ORAL_TABLET | Freq: Two times a day (BID) | ORAL | Status: DC
  Administered 2020-08-22 – 2020-08-25 (×6): 5 mg via ORAL
  Filled 2020-08-22 (×6): qty 1

## 2020-08-22 MED ORDER — INSULIN ASPART 100 UNIT/ML ~~LOC~~ SOLN
0.0000 [IU] | Freq: Every day | SUBCUTANEOUS | Status: DC
  Administered 2020-08-22: 2 [IU] via SUBCUTANEOUS

## 2020-08-22 MED ORDER — ACETAMINOPHEN 500 MG PO TABS
1000.0000 mg | ORAL_TABLET | Freq: Once | ORAL | Status: AC
  Administered 2020-08-22: 1000 mg via ORAL
  Filled 2020-08-22: qty 2

## 2020-08-22 NOTE — H&P (Addendum)
History and Physical    Melinda [name] [id number] DOB: Jul 16, 1955 DOA: 08/22/2020  Referring MD/NP/PA:   PCP: Donnie Coffin, MD   Patient coming from:  The patient is coming from home.   Chief Complaint: Multiple fall  HPI: Melinda [name] is a 65 y.o. female with medical history significant of hypertension, hyperlipidemia, diabetes mellitus, GERD, depression, anxiety, former smoker, PVD with toe  amputations, CAD, CABG, myocardial infarction, atrial fibrillation on Eliquis, CHF with EF 30-35%, cocaine abuse, iron deficiency anemia, gastric bypass surgery, CKD stage IIIa, chronic left foot wound, who presents with multiple falls.  Patient was recently hospitalized from 4/13-4/21 because of sepsis due to catheter related UTI, aspiration pneumonia and CHF exacerbation.  Patient was treated with IV antibiotics in hospital, and discharged on Augmentin. She is supposed to take Augmentin from 4/21 to 4/24.  Patient states that after she went home, she continues to have generalized weakness.  She feels so weak that she fell at least 5 times.  She injured her left hip, and hit her head. She has pain in the left hip and her neck.  No unilateral numbness or tingling in extremities.  No facial droop or slurred speech.  Patient has mild dry cough and mild shortness breath, chest pain, fever or chills.  No nausea vomiting, diarrhea or abdominal pain. No symptoms of UTI.  ED Course: pt was found to have WBC 10.1, negative urinalysis, troponin level 7, 7, lactic acid 1.7, pending COVID-19 PCR, renal function slightly worsening than baseline, temperature normal, blood pressure 116/84, A fib/flutter with heart rate of 120s, RR 23, 17, oxygen saturation 88% on room air which improved to 95% on 2 L oxygen.  Chest x-ray negative.  X-ray of left hip/pelvis negative for fracture.  CT of head is negative for acute intracranial abnormality.  CT of C-spine is negative for bony fracture, but showed degenerative  disc disease. Pt is placed on progressive bed for observation.   Review of Systems:   General: no fevers, chills, no body weight gain, has poor appetite, has fatigue HEENT: no blurry vision, hearing changes or sore throat Respiratory: has dyspnea, coughing, no wheezing CV: no chest pain, no palpitations GI: no nausea, vomiting, abdominal pain, diarrhea, constipation GU: no dysuria, burning on urination, increased urinary frequency, hematuria  Ext: no leg edema Neuro: no unilateral weakness, numbness, or tingling, no vision change or hearing loss. Has multiple fall. Skin: no rash, no skin tear. MSK: has left hip pain and neck pain Heme: No easy bruising.  Travel history: No recent long distant travel.  Allergy: No Known Allergies  Past Medical History:  Diagnosis Date  . Anxiety   . Atrial fibrillation ([place])   . Atrial flutter ([place])   . Coronary artery disease    a. 06/2014 NSTEMI s/p CABG x 4 (LIMA to LAD, VG to Diag, VG to OM, VG to PDA).  . Depression   . Diabetic neuropathy ([place])   . GERD (gastroesophageal reflux disease)   . HTN (hypertension)   . Hyperlipidemia LDL goal <70   . IDDM (insulin dependent diabetes mellitus)   . Ischemic cardiomyopathy    a. 06/2014 Echo: EF 40-45%, HK of entire inferolateral and inferior myocardium c/w infarct of RCA/LCx, GR2DD, mild MR  . Myocardial infarction ([place]) 2016  . OA (osteoarthritis) of knee   . Obesity   . Osteoarthritis   . Osteomyelitis ([place])   . Peripheral neuropathy   . Peripheral vascular disease ([place])  a. 09/2016 Periph Angio: CTO R popliteal, CTO L prox/mid SFA->Med Rx; b. 05/2017 PTA: LSFA (Viabahn covered stent x 2), DEB to L Post Tibial; c. 06/2017 ABI: R 0.44, L 1.05.  . Tobacco abuse     Past Surgical History:  Procedure Laterality Date  . ABDOMINAL AORTOGRAM W/LOWER EXTREMITY N/A 10/27/2016   Procedure: Abdominal Aortogram w/Lower Extremity;  Surgeon: Nelva Bush, MD;  Location: [facility];  Service:  Cardiovascular;  Laterality: N/A;  . AMPUTATION TOE Left 10/21/2015   Procedure: AMPUTATION TOE;  Surgeon: Sharlotte Alamo, DPM;  Location: [facility];  Service: Podiatry;  Laterality: Left;  . AMPUTATION TOE Left 05/15/2017   Procedure: AMPUTATION LEFT GREAT TOE;  Surgeon: Sharlotte Alamo, DPM;  Location: [facility];  Service: Podiatry;  Laterality: Left;  . AORTIC VALVE REPLACEMENT (AVR)/CORONARY ARTERY BYPASS GRAFTING (CABG)    . BREAST BIOPSY    . CARDIAC CATHETERIZATION  06/2014   95% stenosis mLAD, occlusion ostial OM1, 70% stenosis LCx, 95% stenosis mRCA, EF 45%.  Marland Kitchen CARDIOVERSION N/A 04/14/2020   Procedure: CARDIOVERSION with TEE;  Surgeon: Minna Merritts, MD;  Location: [facility];  Service: Cardiovascular;  Laterality: N/A;  . CARDIOVERSION N/A 05/28/2020   Procedure: CARDIOVERSION;  Surgeon: Minna Merritts, MD;  Location: [facility];  Service: Cardiovascular;  Laterality: N/A;  . CORONARY ARTERY BYPASS GRAFT N/A 06/08/2014   Procedure: CORONARY ARTERY BYPASS GRAFTING (CABG);  Surgeon: Grace Isaac, MD;  Location: [facility];  Service: Open Heart Surgery;  Laterality: N/A;  Times 4 using left internal mammary artery to LAD artery and endoscopically harvested bilateral saphenous vein to Obtuse Marginal, Diagonal and Posterior Descending coronary arteries.  . CT ABD W & PELVIS WO CM  06/2014   nl liver, gallbladder, spleen, mild diverticular changes, no bowel wall inflammation, appendix nl, no hernia, no other sig abnormalities  . GASTRIC ROUX-EN-Y N/A 02/09/2020   Procedure: LAPAROSCOPIC ROUX-EN-Y GASTRIC BYPASS WITH UPPER ENDOSCOPY;  Surgeon: Johnathan Hausen, MD;  Location: [facility];  Service: General;  Laterality: N/A;  . LOWER EXTREMITY ANGIOGRAPHY Left 05/23/2017   Procedure: LOWER EXTREMITY ANGIOGRAPHY;  Surgeon: Algernon Huxley, MD;  Location: [facility];  Service: Cardiovascular;  Laterality: Left;  . LOWER EXTREMITY ANGIOGRAPHY Left 05/28/2017   Procedure: LOWER EXTREMITY ANGIOGRAPHY;   Surgeon: Algernon Huxley, MD;  Location: [facility];  Service: Cardiovascular;  Laterality: Left;  . LOWER EXTREMITY ANGIOGRAPHY Left 12/16/2018   Procedure: LOWER EXTREMITY ANGIOGRAPHY;  Surgeon: Algernon Huxley, MD;  Location: [facility];  Service: Cardiovascular;  Laterality: Left;  . LOWER EXTREMITY ANGIOGRAPHY Right 12/23/2018   Procedure: LOWER EXTREMITY ANGIOGRAPHY;  Surgeon: Algernon Huxley, MD;  Location: [facility];  Service: Cardiovascular;  Laterality: Right;  . TEE WITHOUT CARDIOVERSION N/A 06/08/2014   Procedure: TRANSESOPHAGEAL ECHOCARDIOGRAM (TEE);  Surgeon: Grace Isaac, MD;  Location: [facility];  Service: Open Heart Surgery;  Laterality: N/A;  . TEE WITHOUT CARDIOVERSION N/A 04/14/2020   Procedure: TRANSESOPHAGEAL ECHOCARDIOGRAM (TEE);  Surgeon: Minna Merritts, MD;  Location: [facility];  Service: Cardiovascular;  Laterality: N/A;  . TOE AMPUTATION Right 12/2011   rt middle toe  . UPPER GI ENDOSCOPY N/A 02/09/2020   Procedure: UPPER GI ENDOSCOPY;  Surgeon: Johnathan Hausen, MD;  [facility];  Service: General;  Laterality: N/A;  . US ECHOCARDIOGRAPHY  06/2014   EF 50-55%, HK of inf/post/inferolat walls, Ao sclerosis    Social History:  reports that she has  quit smoking. Her smoking use included cigarettes. She has a 20.00 pack-year smoking history. She has never used smokeless tobacco. She reports previous drug use. She reports that she does not drink alcohol.  Family History:  Family History  Problem Relation Age of Onset  . CAD Mother   . Hypertension Mother   . Diabetes Mother   . CAD Father   . Diabetes Father   . Sickle cell trait Other   . Asthma Other      Prior to Admission medications   Medication Sig Start Date End Date Taking? Authorizing Provider  ACCU-CHEK AVIVA PLUS test strip 1 each 3 (three) times daily. 12/31/19   [provider]  Accu-Chek Softclix Lancets lancets 1 each 3 (three) times daily. 01/13/20   [provider]  Alcohol Swabs (B-D SINGLE USE SWABS REGULAR) PADS Apply topically. 01/21/20   [provider]  amiodarone (PACERONE) 200 MG tablet TAKE 1 TABLET TWICE DAILY 07/21/20   Minna Merritts, MD  amLODipine (NORVASC) 5 MG tablet Take 1 tablet by mouth daily. 10/24/19 10/23/20  [provider]  amoxicillin-clavulanate (AUGMENTIN) 875-125 MG tablet Take 1 tablet by mouth 2 (two) times daily for 3 days. 08/19/20 08/22/20  Sidney Ace, MD  apixaban (ELIQUIS) 5 MG TABS tablet Take 1 tablet (5 mg total) by mouth 2 (two) times daily. 05/03/20 06/02/20  Marrianne Mood D, [name]  atorvastatin (LIPITOR) 80 MG tablet Take 1 tablet (80 mg total) by mouth daily at 6 PM. 05/31/20   Gollan, Kathlene November, MD  Cholecalciferol (VITAMIN D3) 50 MCG (2000 UT) TABS Take 2,000 Units by mouth daily.    [provider]  clobetasol (TEMOVATE) 0.05 % external solution Patient to mix entire bottle with 1 jar of CeraVe and apply twice a day x 1 month then as needed. Avoid face, groin, underarms. Patient taking differently: Apply 1 application topically See admin instructions. Patient to mix entire bottle with 1 jar of CeraVe and apply twice a day x 1 month then as needed. Avoid face, groin, underarms. 10/07/19   Brendolyn Patty, MD  clotrimazole (LOTRIMIN) 1 % cream Apply  as directed to affected area twice a day  for fungal infection 06/14/20   [provider]  docusate sodium (COLACE) 100 MG capsule Take 100 mg by mouth 2 (two) times daily.    [provider]  DROPLET PEN NEEDLES 31G X 6 MM MISC  11/10/19   [provider]  DULoxetine (CYMBALTA) 60 MG capsule Take 60 mg by mouth daily.  02/25/19   [provider]  Eflornithine HCl 13.9 % cream Apply to upper lip, chin and neck twice a day. Patient taking differently: Apply 1 application topically 2 (two) times daily. Apply to upper lip, chin and neck twice a day. 10/07/19   Brendolyn Patty, MD  Emollient (CERAVE EX)  Apply 1 application topically in the morning and at bedtime.    [provider]  ferrous sulfate 325 (65 FE) MG tablet Take 325 mg by mouth daily.    [provider]  furosemide (LASIX) 20 MG tablet TAKE 2 TABLETS (40 MG TOTAL) BY MOUTH 2 (TWO) TIMES DAILY AS NEEDED (FLUID RETENTION.). 08/09/20   Minna Merritts, MD  insulin aspart (NOVOLOG) 100 UNIT/ML injection Inject 0-20 Units into the skin every 4 (four) hours. Patient taking differently: Inject into the skin every 4 (four) hours. Sliding scale 02/11/20   Johnathan Hausen, MD  lidocaine (LIDODERM) 5 % Place 1 patch  onto the skin daily as needed (PAIN.).  02/24/19   [provider]  LYRICA 150 MG capsule Take 1 capsule by mouth three times a day  for nerve pain 06/14/20   [provider]  metoprolol tartrate (LOPRESSOR) 50 MG tablet Take 1 tablet (50 mg total) by mouth 2 (two) times daily. 07/12/20   Minna Merritts, MD  Multiple Vitamins-Minerals (MULTIVITAMIN WITH MINERALS) tablet Take 2 tablets by mouth in the morning and at bedtime.    [provider]  oxybutynin (DITROPAN) 5 MG tablet Take 5 mg by mouth 2 (two) times daily.  05/10/17   [provider]  OZEMPIC, 0.25 OR 0.5 MG/DOSE, 2 MG/1.5ML SOPN Inject 0.25 mg subcutaneously once a week  for 4 weeks, then increase to 0.5 mg once weekly 06/14/20   [provider]  pantoprazole (PROTONIX) 40 MG tablet Take 1 tablet (40 mg total) by mouth daily. 07/01/20   Minna Merritts, MD  potassium chloride SA (KLOR-CON) 20 MEQ tablet TAKE 1 TABLET (20 MEQ TOTAL) BY MOUTH DAILY AS NEEDED. Patient taking differently: Take 20 mEq by mouth daily as needed. 07/23/19   Theora Gianotti, NP  tiZANidine (ZANAFLEX) 4 MG tablet Take 4 mg by mouth 3 (three) times daily. 06/14/18   [provider]    Physical Exam: Vitals:   08/22/20 1235 08/22/20 1240 08/22/20 1300 08/22/20 1400  BP:   129/79 134/83  Pulse: (!) 105 (!) 104 100 (!)  105  Resp: (!) 24 20 (!) 23 19  Temp:      SpO2: 94% 93%  92%  Weight:      Height:       General: Not in acute distress HEENT:       Eyes: PERRL, EOMI, no scleral icterus.       ENT: No discharge from the ears and nose, no pharynx injection, no tonsillar enlargement.        Neck: No JVD, no bruit, no mass felt. Heme: No neck lymph node enlargement. Cardiac: S1/S2, RRR, No murmurs, No gallops or rubs. Respiratory: No rales, wheezing, rhonchi or rubs. GI: Soft, nondistended, nontender, no rebound pain, no organomegaly, BS present. GU: No hematuria Ext: No pitting leg edema bilaterally. Has chronic left foot wound without active draining      Musculoskeletal: No joint deformities, No joint redness or warmth, no limitation of ROM in spin. Skin: No rashes.  Neuro: Alert, oriented X3, cranial nerves II-XII grossly intact, moves all extremities normally.  Psych: Patient is not psychotic, no suicidal or hemocidal ideation.  Labs on Admission: I have personally reviewed following labs and imaging studies  CBC: Recent Labs  Lab 08/16/20 0901 08/17/20 0410 08/18/20 0526 08/19/20 0735 08/22/20 0858  WBC 30.8* 25.2* 17.9* 13.4* 10.1  NEUTROABS  --   --   --  11.0* 7.1  HGB 14.9 15.0 14.6 12.8 13.2  HCT 45.7 45.3 44.4 38.0 39.7  MCV 75.7* 74.8* 75.6* 73.1* 73.7*  PLT 317 303 303 323 147   Basic Metabolic Panel: Recent Labs  Lab 08/16/20 0634 08/16/20 0900 08/17/20 0410 08/18/20 0526 08/19/20 0735 08/22/20 0858  NA 141 141 145 146* 139 137  K 3.8 3.9 4.0 3.6 3.4* 3.9  CL 99 98 101 106 102 98  CO2 31 32 29 29 27 28   GLUCOSE 252* 259* 216* 196* 235* 164*  BUN 17 17 21 17 12 17   CREATININE 1.06* 1.22* 1.13* 0.94 0.93 1.31*  CALCIUM 9.0 9.1 8.8* 8.8*  8.2* 8.5*  MG 1.9  --   --   --   --  1.7   GFR: Estimated Creatinine Clearance: 52.2 mL/min (A) (by C-G formula based on SCr of 1.31 mg/dL (H)). Liver Function Tests: Recent Labs  Lab 08/16/20 0900 08/17/20 0410  08/22/20 0858  AST 24 30 50*  ALT 32 31 46*  ALKPHOS 115 131* 108  BILITOT 1.3* 1.1 1.1  PROT 7.3 6.7 6.8  ALBUMIN 2.7* 2.4* 2.4*   No results for input(s): LIPASE, AMYLASE in the last 168 hours. Recent Labs  Lab 08/16/20 0901  AMMONIA 10   Coagulation Profile: No results for input(s): INR, PROTIME in the last 168 hours. Cardiac Enzymes: No results for input(s): CKTOTAL, CKMB, CKMBINDEX, TROPONINI in the last 168 hours. BNP (last 3 results) No results for input(s): PROBNP in the last 8760 hours. HbA1C: No results for input(s): HGBA1C in the last 72 hours. CBG: No results for input(s): GLUCAP in the last 168 hours. Lipid Profile: No results for input(s): CHOL, HDL, LDLCALC, TRIG, CHOLHDL, LDLDIRECT in the last 72 hours. Thyroid Function Tests: No results for input(s): TSH, T4TOTAL, FREET4, T3FREE, THYROIDAB in the last 72 hours. Anemia Panel: No results for input(s): VITAMINB12, FOLATE, FERRITIN, TIBC, IRON, RETICCTPCT in the last 72 hours. Urine analysis:    Component Value Date/Time   COLORURINE YELLOW (A) 08/22/2020 1120   APPEARANCEUR HAZY (A) 08/22/2020 1120   APPEARANCEUR Clear 10/10/2016 1911   LABSPEC 1.010 08/22/2020 1120   LABSPEC 1.031 06/01/2014 2258   PHURINE 5.0 08/22/2020 1120   GLUCOSEU NEGATIVE 08/22/2020 1120   GLUCOSEU 50 mg/dL 06/01/2014 2258   HGBUR MODERATE (A) 08/22/2020 1120   BILIRUBINUR NEGATIVE 08/22/2020 1120   BILIRUBINUR Negative 10/10/2016 1911   BILIRUBINUR Negative 06/01/2014 2258   KETONESUR NEGATIVE 08/22/2020 1120   PROTEINUR NEGATIVE 08/22/2020 1120   UROBILINOGEN 1.0 06/07/2014 0952   NITRITE NEGATIVE 08/22/2020 1120   LEUKOCYTESUR TRACE (A) 08/22/2020 1120   LEUKOCYTESUR Negative 06/01/2014 2258   Sepsis Labs: @LABRCNTIP (procalcitonin:4,lacticidven:4) ) Recent Results (from the past 240 hour(s))  Urine Culture     Status: None   Collection Time: 08/16/20 12:13 PM   Specimen: Urine, Random  Result Value Ref Range Status    Specimen Description   Final    URINE, RANDOM Performed at [facility], [address], [place], [place] [address]    Special Requests   Final    NONE Performed at [facility], [address] [place], [place] [address]    Culture   Final    NO GROWTH Performed at [facility], [place] [address] [place], [place] [address]    Report Status 08/18/2020 FINAL  Final     Radiological Exams on Admission: CT Head Wo Contrast  Result Date: 08/22/2020 CLINICAL DATA:  Multiple falls. On anticoagulation. Head trauma. Left-sided neck pain. EXAM: CT HEAD WITHOUT CONTRAST CT CERVICAL SPINE WITHOUT CONTRAST TECHNIQUE: Multidetector CT imaging of the head and cervical spine was performed following the standard protocol without intravenous contrast. Multiplanar CT image reconstructions of the cervical spine were also generated. COMPARISON:  08/11/2020, 08/16/2020 FINDINGS: CT HEAD FINDINGS Brain: No evidence of acute infarction, hemorrhage, hydrocephalus, extra-axial collection or mass lesion/mass effect. Scattered low-density changes within the periventricular and subcortical white matter compatible with chronic microvascular ischemic change. Vascular: Atherosclerotic calcifications involving the large vessels of the skull base. No unexpected hyperdense vessel. Skull: Normal. Negative for fracture or focal lesion. Sinuses/Orbits: No acute finding. Other: Negative for  scalp hematoma CT CERVICAL SPINE FINDINGS Alignment: Facet joints are aligned without dislocation or traumatic listhesis. Dens and lateral masses are aligned. Skull base and vertebrae: No acute fracture. No primary bone lesion or focal pathologic process. Soft tissues and spinal canal: No prevertebral fluid or swelling. No visible canal hematoma. Disc levels: Moderate multilevel cervical spondylosis, which appears most pronounced at the C3-4 through C5-6 levels. No progression from prior. Upper chest: Included lung apices  are clear Other: Degenerative changes of the bilateral sternoclavicular joints. Bilateral carotid atherosclerosis. IMPRESSION: 1. No acute intracranial findings. 2. No evidence of acute fracture or traumatic listhesis of the cervical spine. 3. Moderate multilevel cervical spondylosis, most pronounced at the C3-4 through C5-6 levels. Electronically Signed   By: Davina Poke D.O.   On: 08/22/2020 09:20   CT Cervical Spine Wo Contrast  Result Date: 08/22/2020 CLINICAL DATA:  Multiple falls. On anticoagulation. Head trauma. Left-sided neck pain. EXAM: CT HEAD WITHOUT CONTRAST CT CERVICAL SPINE WITHOUT CONTRAST TECHNIQUE: Multidetector CT imaging of the head and cervical spine was performed following the standard protocol without intravenous contrast. Multiplanar CT image reconstructions of the cervical spine were also generated. COMPARISON:  08/11/2020, 08/16/2020 FINDINGS: CT HEAD FINDINGS Brain: No evidence of acute infarction, hemorrhage, hydrocephalus, extra-axial collection or mass lesion/mass effect. Scattered low-density changes within the periventricular and subcortical white matter compatible with chronic microvascular ischemic change. Vascular: Atherosclerotic calcifications involving the large vessels of the skull base. No unexpected hyperdense vessel. Skull: Normal. Negative for fracture or focal lesion. Sinuses/Orbits: No acute finding. Other: Negative for scalp hematoma CT CERVICAL SPINE FINDINGS Alignment: Facet joints are aligned without dislocation or traumatic listhesis. Dens and lateral masses are aligned. Skull base and vertebrae: No acute fracture. No primary bone lesion or focal pathologic process. Soft tissues and spinal canal: No prevertebral fluid or swelling. No visible canal hematoma. Disc levels: Moderate multilevel cervical spondylosis, which appears most pronounced at the C3-4 through C5-6 levels. No progression from prior. Upper chest: Included lung apices are clear Other:  Degenerative changes of the bilateral sternoclavicular joints. Bilateral carotid atherosclerosis. IMPRESSION: 1. No acute intracranial findings. 2. No evidence of acute fracture or traumatic listhesis of the cervical spine. 3. Moderate multilevel cervical spondylosis, most pronounced at the C3-4 through C5-6 levels. Electronically Signed   By: Davina Poke D.O.   On: 08/22/2020 09:20   DG Chest Portable 1 View  Result Date: 08/22/2020 CLINICAL DATA:  Recurrent falls. Coronary artery disease. Left hip pain. EXAM: PORTABLE CHEST 1 VIEW COMPARISON:  08/15/2020 FINDINGS: Heart size is within normal limits. Prior CABG again noted. No evidence of pulmonary infiltrate or edema. No evidence of pleural effusion. IMPRESSION: No active disease. Electronically Signed   By: Marlaine Hind M.D.   On: 08/22/2020 09:48   DG Hip Unilat W or Wo Pelvis 2-3 Views Left  Result Date: 08/22/2020 CLINICAL DATA:  Recurrent fall.  Left hip pain. EXAM: DG HIP (WITH OR WITHOUT PELVIS) 2-3V LEFT COMPARISON:  None. FINDINGS: Stable chronic deformity of the right hip. No signs of acute left hip fracture or dislocation. Bilateral hip osteoarthritis. Extensive vascular calcifications noted. IMPRESSION: 1. No acute findings. 2. Chronic deformity of the right hip. Electronically Signed   By: Kerby Moors M.D.   On: 08/22/2020 09:46     EKG: I have personally reviewed.  Atrial fibrillation/flutter 2:1 AV block, PVC, poor R wave progression, QTC 508.  Assessment/Plan Principal Problem:   Fall at home, initial encounter Active Problems:   Hypertension  Type II diabetes mellitus with renal manifestations (HCC)   Gastroesophageal reflux disease   Claudication in peripheral vascular disease (HCC)   Hyperlipidemia LDL goal <70   CAD (coronary artery disease), native coronary artery   Atrial fibrillation with RVR (HCC)   CKD (chronic kidney disease), stage IIIa   Depression   Chronic systolic CHF (congestive heart failure)  (HCC)   Iron deficiency anemia   Fall at home, initial encounter: CT head negative.  CT of C-spine is negative for acute bony fracture, but showed degenerative disc disease.  X-ray of left hip is negative.  Patient seems to have generalized declining.  She may need SNF placement  -Placed on progressive bed for observation due to A. fib with RVR -PT/OT -Consult transition care team for possible placement -Fall precaution  Hypertension -IV hydralazine as needed -Amlodipine, Lasix, metoprolol  Type II diabetes mellitus with renal manifestations [facility]): Patient is taking Ozempic and NovoLog at home.  Recent A1c 8.0, poorly controlled -Sliding scale insulin  Gastroesophageal reflux disease -Protonix  Claudication in peripheral vascular disease (HCC) -Continue Lipitor  Hyperlipidemia LDL goal <70 -Lipitor  CAD (coronary artery disease), native coronary artery -Lipitor  Atrial fibrillation with RVR [facility]): Heart rate 120, 109 -Continue home metoprolol, amiodarone  CKD (chronic kidney disease), stage IIIa: Renal function close to baseline.  Baseline creatinine 1.0-1.2.  Her creatinine is 1.01, BUN 17. -Follow-up by BMP  Depression -Continue home medications  Chronic systolic CHF (congestive heart failure) ([place]): No leg edema.  No pulm edema chest x-ray CHF is compensated.  2D echo on 08/12/2020 showed EF 30 to 35%.  BNP 99.8. -Continue Lasix  Iron deficiency anemia: Hemoglobin stable, 13.2 -Continue iron supplement  Chronic left foot wound: Patient is on Augmentin which is for recent UTI and pneumonia, today is last day of course of Augmentin.  His left foot wound does not seem to be infected -Consult wound care    DVT ppx: on Eliquis Code Status: Full code Family Communication: not done, no family member is at bed side. Disposition Plan:  Anticipate discharge back to previous environment Consults called:  none Admission status and Level of care: Progressive Cardiac:     for obs    Status is: Observation  The patient remains OBS appropriate and will d/c before 2 midnights.  Dispo: The patient is from: Home              Anticipated d/c is to: to be determined, may need SNF              Patient currently is not medically stable to d/c.   Difficult to place patient No          Date of Service 08/22/2020    [facility]   If 7PM-7AM, please contact night-coverage [email or web address] 08/22/2020, 2:32 PM

## 2020-08-22 NOTE — ED Notes (Signed)
Pt given meal tray and water at this time 

## 2020-08-22 NOTE — ED Notes (Signed)
[employer] [phone]

## 2020-08-22 NOTE — ED Provider Notes (Signed)
[facility] Emergency Department Provider Note ____________________________________________   Event Date/Time   First MD Initiated Contact with Patient 08/22/20 [phone]     (approximate)  I have reviewed the triage vital signs and the nursing notes.  HISTORY  Chief Complaint Fall   HPI Melinda [name] is a 65 y.o. femalewho presents to the ED for evaluation of multiple falls.   Chart review indicates recent admission 4/13-4/21 due to acute hypoxic respiratory failure from aspiration pneumonia requiring initial BiPAP and ICU care.  Admission complicated by sepsis on 4/18 attributed to catheter related UTI and Foley catheter was removed.  Discharged with Augmentin. History of CAD s/p CABG, A. fib/flutter on Eliquis, COPD, DM, tobacco abuse, PAD with multiple toes amputated, obesity, HTN and HLD.  Patient presents to the ED due to recurrent falls since her recent admission.  She reports being at her sister's house since discharge 3 days ago and has fallen 5 or 6 times in the same timeframe.  She reports her sister called 50 today without patient's consent.  Patient reports falling this morning while using her walker to get to the restroom.  She reports she was able to get herself back up from the fall, but is reporting left-sided neck and left-sided hip pain.  Currently reporting 11/10 pain and asking for breakfast at the same time.  Reports that she has not taken her medications yet this morning.  Reports her breathing feels okay and denies chest pain or pressure.  Denies syncopal events with her falls.   Past Medical History:  Diagnosis Date  . Anxiety   . Atrial fibrillation ([place])   . Atrial flutter ([place])   . Coronary artery disease    a. 06/2014 NSTEMI s/p CABG x 4 (LIMA to LAD, VG to Diag, VG to OM, VG to PDA).  . Depression   . Diabetic neuropathy ([place])   . GERD (gastroesophageal reflux disease)   . HTN (hypertension)   . Hyperlipidemia LDL goal <70    . IDDM (insulin dependent diabetes mellitus)   . Ischemic cardiomyopathy    a. 06/2014 Echo: EF 40-45%, HK of entire inferolateral and inferior myocardium c/w infarct of RCA/LCx, GR2DD, mild MR  . Myocardial infarction ([place]) 2016  . OA (osteoarthritis) of knee   . Obesity   . Osteoarthritis   . Osteomyelitis ([place])   . Peripheral neuropathy   . Peripheral vascular disease ([place])    a. 09/2016 Periph Angio: CTO R popliteal, CTO L prox/mid SFA->Med Rx; b. 05/2017 PTA: LSFA (Viabahn covered stent x 2), DEB to L Post Tibial; c. 06/2017 ABI: R 0.44, L 1.05.  . Tobacco abuse     Patient Active Problem List   Diagnosis Date Noted  . Fall at home, initial encounter 08/22/2020  . Atrial fibrillation with RVR ([employer]) 08/22/2020  . CKD (chronic kidney disease), stage IIIa 08/22/2020  . Depression 08/22/2020  . Chronic systolic CHF (congestive heart failure) ([place]) 08/22/2020  . Iron deficiency anemia 08/22/2020  . Atrial flutter with rapid ventricular response ([place])   . Acute respiratory failure with hypoxia ([place]) 08/11/2020  . S/P gastric bypass 02/09/2020  . Severe recurrent major depression without psychotic features ([place]) 03/12/2018  . Cocaine abuse ([place]) 03/12/2018  . Suicidal ideation 03/12/2018  . Atherosclerosis of native arteries of the extremities with ulceration ([place]) 06/19/2017  . Gangrene of toe of left foot ([place]) 05/14/2017  . CAD (coronary artery disease), native coronary artery 05/05/2017  . Claudication in peripheral vascular  disease ([place]) 10/18/2016  . Hyperlipidemia LDL goal <70 10/18/2016  . Onychomycosis 10/10/2016  . Encounters for administrative purpose 10/10/2016  . Amputated toe, left ([place]) 10/10/2016  . Amputated toe, right ([place]) 10/10/2016  . Osteomyelitis of left foot ([place]) 10/21/2015  . Hyperglycemia 10/02/2015  . Gastroesophageal reflux disease 11/16/2014  . Generalized ischemic myocardial dysfunction 07/07/2014  . Peripheral neuropathy   . Tobacco abuse   .  Obesity   . 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 Chronic coronary artery disease 06/08/2014  . Type II diabetes mellitus with renal manifestations ([place])   . NSTEMI (non-ST elevated myocardial infarction) ([place]) 06/03/2014  . Hypertension 09/11/2012  . Diabetes mellitus type 2, insulin dependent ([place]) 09/11/2012    Past Surgical History:  Procedure Laterality Date  . ABDOMINAL AORTOGRAM W/LOWER EXTREMITY N/A 10/27/2016   Procedure: Abdominal Aortogram w/Lower Extremity;  Surgeon: Nelva Bush, MD;  Location: [place] [facility];  Service: Cardiovascular;  Laterality: N/A;  . AMPUTATION TOE Left 10/21/2015   Procedure: AMPUTATION TOE;  Surgeon: Sharlotte Alamo, DPM;  Location: [facility];  Service: Podiatry;  Laterality: Left;  . AMPUTATION TOE Left 05/15/2017   Procedure: AMPUTATION LEFT GREAT TOE;  Surgeon: Sharlotte Alamo, DPM;  Location: [facility];  Service: Podiatry;  Laterality: Left;  . AORTIC VALVE REPLACEMENT (AVR)/CORONARY ARTERY BYPASS GRAFTING (CABG)    . BREAST BIOPSY    . CARDIAC CATHETERIZATION  06/2014   95% stenosis mLAD, occlusion ostial OM1, 70% stenosis LCx, 95% stenosis mRCA, EF 45%.  Marland Kitchen CARDIOVERSION N/A 04/14/2020   Procedure: CARDIOVERSION with TEE;  Surgeon: Minna Merritts, MD;  Location: [facility];  Service: Cardiovascular;  Laterality: N/A;  . CARDIOVERSION N/A 05/28/2020   Procedure: CARDIOVERSION;  Surgeon: Minna Merritts, MD;  Location: [facility];  Service: Cardiovascular;  Laterality: N/A;  . CORONARY ARTERY BYPASS GRAFT N/A 06/08/2014   Procedure: CORONARY ARTERY BYPASS GRAFTING (CABG);  Surgeon: Grace Isaac, MD;  Location: [place];  Service: Open Heart Surgery;  Laterality: N/A;  Times 4 using left internal mammary artery to LAD artery and endoscopically harvested bilateral saphenous vein to Obtuse Marginal, Diagonal and Posterior Descending coronary arteries.  . CT ABD W & PELVIS WO CM  06/2014   nl liver, gallbladder, spleen, mild diverticular changes, no bowel wall  inflammation, appendix nl, no hernia, no other sig abnormalities  . GASTRIC ROUX-EN-Y N/A 02/09/2020   Procedure: LAPAROSCOPIC ROUX-EN-Y GASTRIC BYPASS WITH UPPER ENDOSCOPY;  Surgeon: Johnathan Hausen, MD;  Location: [facility];  Service: General;  Laterality: N/A;  . LOWER EXTREMITY ANGIOGRAPHY Left 05/23/2017   Procedure: LOWER EXTREMITY ANGIOGRAPHY;  Surgeon: Algernon Huxley, MD;  Location: [place] [facility];  Service: Cardiovascular;  Laterality: Left;  . LOWER EXTREMITY ANGIOGRAPHY Left 05/28/2017   Procedure: LOWER EXTREMITY ANGIOGRAPHY;  Surgeon: Algernon Huxley, MD;  Location: [place] [facility];  Service: Cardiovascular;  Laterality: Left;  . LOWER EXTREMITY ANGIOGRAPHY Left 12/16/2018   Procedure: LOWER EXTREMITY ANGIOGRAPHY;  Surgeon: Algernon Huxley, MD;  Location: [place] [facility];  Service: Cardiovascular;  Laterality: Left;  . LOWER EXTREMITY ANGIOGRAPHY Right 12/23/2018   Procedure: LOWER EXTREMITY ANGIOGRAPHY;  Surgeon: Algernon Huxley, MD;  Location: [place] [facility];  Service: Cardiovascular;  Laterality: Right;  . TEE WITHOUT CARDIOVERSION N/A 06/08/2014   Procedure: TRANSESOPHAGEAL ECHOCARDIOGRAM (TEE);  Surgeon: Grace Isaac, MD;  Location: [place];  Service: Open Heart Surgery;  Laterality: N/A;  . TEE WITHOUT CARDIOVERSION N/A 04/14/2020   Procedure:  TRANSESOPHAGEAL ECHOCARDIOGRAM (TEE);  Surgeon: Minna Merritts, MD;  Location: [facility];  Service: Cardiovascular;  Laterality: N/A;  . TOE AMPUTATION Right 12/2011   rt middle toe  . UPPER GI ENDOSCOPY N/A 02/09/2020   Procedure: UPPER GI ENDOSCOPY;  Surgeon: Johnathan Hausen, MD;  Location: [facility];  Service: General;  Laterality: N/A;  . US ECHOCARDIOGRAPHY  06/2014   EF 50-55%, HK of inf/post/inferolat walls, Ao sclerosis    Prior to Admission medications   Medication Sig Start Date End Date Taking? Authorizing Provider  ACCU-CHEK AVIVA PLUS test strip 1 each 3 (three) times daily. 12/31/19   [provider]   Accu-Chek Softclix Lancets lancets 1 each 3 (three) times daily. 01/13/20   [provider]  Alcohol Swabs (B-D SINGLE USE SWABS REGULAR) PADS Apply topically. 01/21/20   [provider]  amiodarone (PACERONE) 200 MG tablet TAKE 1 TABLET TWICE DAILY 07/21/20   Minna Merritts, MD  amLODipine (NORVASC) 5 MG tablet Take 1 tablet by mouth daily. 10/24/19 10/23/20  [provider]  amoxicillin-clavulanate (AUGMENTIN) 875-125 MG tablet Take 1 tablet by mouth 2 (two) times daily for 3 days. 08/19/20 08/22/20  Sidney Ace, MD  apixaban (ELIQUIS) 5 MG TABS tablet Take 1 tablet (5 mg total) by mouth 2 (two) times daily. 05/03/20 06/02/20  Marrianne Mood D, [name]  atorvastatin (LIPITOR) 80 MG tablet Take 1 tablet (80 mg total) by mouth daily at 6 PM. 05/31/20   Gollan, Kathlene November, MD  Cholecalciferol (VITAMIN D3) 50 MCG (2000 UT) TABS Take 2,000 Units by mouth daily.    [provider]  clobetasol (TEMOVATE) 0.05 % external solution Patient to mix entire bottle with 1 jar of CeraVe and apply twice a day x 1 month then as needed. Avoid face, groin, underarms. Patient taking differently: Apply 1 application topically See admin instructions. Patient to mix entire bottle with 1 jar of CeraVe and apply twice a day x 1 month then as needed. Avoid face, groin, underarms. 10/07/19   Brendolyn Patty, MD  clotrimazole (LOTRIMIN) 1 % cream Apply  as directed to affected area twice a day  for fungal infection 06/14/20   [provider]  docusate sodium (COLACE) 100 MG capsule Take 100 mg by mouth 2 (two) times daily.    [provider]  DROPLET PEN NEEDLES 31G X 6 MM MISC  11/10/19   [provider]  DULoxetine (CYMBALTA) 60 MG capsule Take 60 mg by mouth daily.  02/25/19   [provider]  Eflornithine HCl 13.9 % cream Apply to upper lip, chin and neck twice a day. Patient taking differently: Apply 1 application topically 2 (two) times daily. Apply to  upper lip, chin and neck twice a day. 10/07/19   Brendolyn Patty, MD  Emollient (CERAVE EX) Apply 1 application topically in the morning and at bedtime.    [provider]  ferrous sulfate 325 (65 FE) MG tablet Take 325 mg by mouth daily.    [provider]  furosemide (LASIX) 20 MG tablet TAKE 2 TABLETS (40 MG TOTAL) BY MOUTH 2 (TWO) TIMES DAILY AS NEEDED (FLUID RETENTION.). 08/09/20   Minna Merritts, MD  insulin aspart (NOVOLOG) 100 UNIT/ML injection Inject 0-20 Units into the skin every 4 (four) hours. Patient taking differently: Inject into the skin every 4 (four) hours. Sliding scale 02/11/20   Johnathan Hausen, MD  lidocaine (LIDODERM) 5 % Place 1 patch onto the skin daily as needed (PAIN.).  02/24/19  [provider]  LYRICA 150 MG capsule Take 1 capsule by mouth three times a day  for nerve pain 06/14/20   [provider]  metoprolol tartrate (LOPRESSOR) 50 MG tablet Take 1 tablet (50 mg total) by mouth 2 (two) times daily. 07/12/20   Minna Merritts, MD  Multiple Vitamins-Minerals (MULTIVITAMIN WITH MINERALS) tablet Take 2 tablets by mouth in the morning and at bedtime.    [provider]  oxybutynin (DITROPAN) 5 MG tablet Take 5 mg by mouth 2 (two) times daily.  05/10/17   [provider]  OZEMPIC, 0.25 OR 0.5 MG/DOSE, 2 MG/1.5ML SOPN Inject 0.25 mg subcutaneously once a week  for 4 weeks, then increase to 0.5 mg once weekly 06/14/20   [provider]  pantoprazole (PROTONIX) 40 MG tablet Take 1 tablet (40 mg total) by mouth daily. 07/01/20   Minna Merritts, MD  potassium chloride SA (KLOR-CON) 20 MEQ tablet TAKE 1 TABLET (20 MEQ TOTAL) BY MOUTH DAILY AS NEEDED. Patient taking differently: Take 20 mEq by mouth daily as needed. 07/23/19   Theora Gianotti, NP  tiZANidine (ZANAFLEX) 4 MG tablet Take 4 mg by mouth 3 (three) times daily. 06/14/18   [provider]    Allergies Patient has no known  allergies.  Family History  Problem Relation Age of Onset  . CAD Mother   . Hypertension Mother   . Diabetes Mother   . CAD Father   . Diabetes Father   . Sickle cell trait Other   . Asthma Other     Social History Social History   Tobacco Use  . Smoking status: Former Smoker    Packs/day: 0.50    Years: 40.00    Pack years: 20.00    Types: Cigarettes  . Smokeless tobacco: Never Used  . Tobacco comment: started Chantix 04/12/20  Vaping Use  . Vaping Use: Never used  Substance Use Topics  . Alcohol use: No    Alcohol/week: 0.0 standard drinks  . Drug use: Not Currently    Comment: crack    Review of Systems  Constitutional: No fever/chills.  Positive generalized weakness and recurrent falls. Eyes: No visual changes. ENT: No sore throat. Cardiovascular: Denies chest pain. Respiratory: Denies shortness of breath. Gastrointestinal: No abdominal pain.  No nausea, no vomiting.  No diarrhea.  No constipation. Genitourinary: Negative for dysuria. Musculoskeletal: Negative for back pain.  Positive for left hip and left-sided neck/shoulder pain. Skin: Negative for rash. Neurological: Negative for headaches, focal weakness or numbness.  ____________________________________________   PHYSICAL EXAM:  VITAL SIGNS: Vitals:   08/22/20 1235 08/22/20 1240  BP:    Pulse: (!) 105 (!) 104  Resp: (!) 24 20  Temp:    SpO2: 94% 93%     Constitutional: Alert and oriented. Well appearing and in no acute distress.  Obese.  Sitting up in bed and conversational in full sentences. Eyes: Conjunctivae are normal. PERRL. EOMI. Head: Atraumatic. Neck: Left-sided paraspinal neck pain and left-sided trapezius musculature pain without signs of overlying trauma.  No midline neck tenderness or bony step-offs. Nose: No congestion/rhinnorhea. Mouth/Throat: Mucous membranes are moist.  Oropharynx non-erythematous. Neck: No stridor. No cervical spine tenderness to palpation. Cardiovascular:  Tachycardic rate, regular rhythm. Grossly normal heart sounds.  Good peripheral circulation. Respiratory: Normal respiratory effort.  No retractions. Lungs CTAB. Gastrointestinal: Soft , nondistended, nontender to palpation. No CVA tenderness. Musculoskeletal: No lower extremity tenderness.No joint effusions.  Multiple toe amputations.  No erythema, warmth or  signs of infection to the bilateral feet. Neurologic:  Normal speech and language. No gross focal neurologic deficits are appreciated. No gait instability noted. Skin:  Skin is warm, dry . No rash noted. Psychiatric: Mood and affect are normal. Speech and behavior are normal.  ____________________________________________   LABS (all labs ordered are listed, but only abnormal results are displayed)  Labs Reviewed  COMPREHENSIVE METABOLIC PANEL - Abnormal; Notable for the following components:      Result Value   Glucose, Bld 164 (*)    Creatinine, Ser 1.31 (*)    Calcium 8.5 (*)    Albumin 2.4 (*)    AST 50 (*)    ALT 46 (*)    GFR, Estimated 45 (*)    All other components within normal limits  CBC WITH DIFFERENTIAL/PLATELET - Abnormal; Notable for the following components:   RBC 5.39 (*)    MCV 73.7 (*)    MCH 24.5 (*)    RDW 22.5 (*)    Abs Immature Granulocytes 0.48 (*)    All other components within normal limits  URINALYSIS, COMPLETE (UACMP) WITH MICROSCOPIC - Abnormal; Notable for the following components:   Color, Urine YELLOW (*)    APPearance HAZY (*)    Hgb urine dipstick MODERATE (*)    Leukocytes,Ua TRACE (*)    All other components within normal limits  SARS CORONAVIRUS 2 (TAT 6-24 HRS)  MAGNESIUM  BRAIN NATRIURETIC PEPTIDE  LACTIC ACID, PLASMA  TROPONIN I (HIGH SENSITIVITY)  TROPONIN I (HIGH SENSITIVITY)   ____________________________________________  12 Lead EKG  Atrial flutter with 2 1 block, rate of 113 bpm.  Normal axis.  RBBB morphology.  No evidence of acute  ischemia. ____________________________________________  RADIOLOGY  ED MD interpretation: CT head reviewed by me without evidence of acute cranial pathology. CT C-spine without evidence of fracture or dislocation. CXR without evidence of acute cardiopulmonary pathology. Plain film of the pelvis and left hip reviewed by me without evidence of fracture or dislocation  Official radiology report(s): CT Head Wo Contrast  Result Date: 08/22/2020 CLINICAL DATA:  Multiple falls. On anticoagulation. Head trauma. Left-sided neck pain. EXAM: CT HEAD WITHOUT CONTRAST CT CERVICAL SPINE WITHOUT CONTRAST TECHNIQUE: Multidetector CT imaging of the head and cervical spine was performed following the standard protocol without intravenous contrast. Multiplanar CT image reconstructions of the cervical spine were also generated. COMPARISON:  08/11/2020, 08/16/2020 FINDINGS: CT HEAD FINDINGS Brain: No evidence of acute infarction, hemorrhage, hydrocephalus, extra-axial collection or mass lesion/mass effect. Scattered low-density changes within the periventricular and subcortical white matter compatible with chronic microvascular ischemic change. Vascular: Atherosclerotic calcifications involving the large vessels of the skull base. No unexpected hyperdense vessel. Skull: Normal. Negative for fracture or focal lesion. Sinuses/Orbits: No acute finding. Other: Negative for scalp hematoma CT CERVICAL SPINE FINDINGS Alignment: Facet joints are aligned without dislocation or traumatic listhesis. Dens and lateral masses are aligned. Skull base and vertebrae: No acute fracture. No primary bone lesion or focal pathologic process. Soft tissues and spinal canal: No prevertebral fluid or swelling. No visible canal hematoma. Disc levels: Moderate multilevel cervical spondylosis, which appears most pronounced at the C3-4 through C5-6 levels. No progression from prior. Upper chest: Included lung apices are clear Other: Degenerative changes  of the bilateral sternoclavicular joints. Bilateral carotid atherosclerosis. IMPRESSION: 1. No acute intracranial findings. 2. No evidence of acute fracture or traumatic listhesis of the cervical spine. 3. Moderate multilevel cervical spondylosis, most pronounced at the C3-4 through C5-6 levels. Electronically Signed  By: Davina Poke D.O.   On: 08/22/2020 09:20   CT Cervical Spine Wo Contrast  Result Date: 08/22/2020 CLINICAL DATA:  Multiple falls. On anticoagulation. Head trauma. Left-sided neck pain. EXAM: CT HEAD WITHOUT CONTRAST CT CERVICAL SPINE WITHOUT CONTRAST TECHNIQUE: Multidetector CT imaging of the head and cervical spine was performed following the standard protocol without intravenous contrast. Multiplanar CT image reconstructions of the cervical spine were also generated. COMPARISON:  08/11/2020, 08/16/2020 FINDINGS: CT HEAD FINDINGS Brain: No evidence of acute infarction, hemorrhage, hydrocephalus, extra-axial collection or mass lesion/mass effect. Scattered low-density changes within the periventricular and subcortical white matter compatible with chronic microvascular ischemic change. Vascular: Atherosclerotic calcifications involving the large vessels of the skull base. No unexpected hyperdense vessel. Skull: Normal. Negative for fracture or focal lesion. Sinuses/Orbits: No acute finding. Other: Negative for scalp hematoma CT CERVICAL SPINE FINDINGS Alignment: Facet joints are aligned without dislocation or traumatic listhesis. Dens and lateral masses are aligned. Skull base and vertebrae: No acute fracture. No primary bone lesion or focal pathologic process. Soft tissues and spinal canal: No prevertebral fluid or swelling. No visible canal hematoma. Disc levels: Moderate multilevel cervical spondylosis, which appears most pronounced at the C3-4 through C5-6 levels. No progression from prior. Upper chest: Included lung apices are clear Other: Degenerative changes of the bilateral  sternoclavicular joints. Bilateral carotid atherosclerosis. IMPRESSION: 1. No acute intracranial findings. 2. No evidence of acute fracture or traumatic listhesis of the cervical spine. 3. Moderate multilevel cervical spondylosis, most pronounced at the C3-4 through C5-6 levels. Electronically Signed   By: Davina Poke D.O.   On: 08/22/2020 09:20   DG Chest Portable 1 View  Result Date: 08/22/2020 CLINICAL DATA:  Recurrent falls. Coronary artery disease. Left hip pain. EXAM: PORTABLE CHEST 1 VIEW COMPARISON:  08/15/2020 FINDINGS: Heart size is within normal limits. Prior CABG again noted. No evidence of pulmonary infiltrate or edema. No evidence of pleural effusion. IMPRESSION: No active disease. Electronically Signed   By: Marlaine Hind M.D.   On: 08/22/2020 09:48   DG Hip Unilat W or Wo Pelvis 2-3 Views Left  Result Date: 08/22/2020 CLINICAL DATA:  Recurrent fall.  Left hip pain. EXAM: DG HIP (WITH OR WITHOUT PELVIS) 2-3V LEFT COMPARISON:  None. FINDINGS: Stable chronic deformity of the right hip. No signs of acute left hip fracture or dislocation. Bilateral hip osteoarthritis. Extensive vascular calcifications noted. IMPRESSION: 1. No acute findings. 2. Chronic deformity of the right hip. Electronically Signed   By: Kerby Moors M.D.   On: 08/22/2020 09:46    ____________________________________________   PROCEDURES and INTERVENTIONS  Procedure(s) performed (including Critical Care):  .1-3 Lead EKG Interpretation Performed by: Vladimir Crofts, MD Authorized by: Vladimir Crofts, MD     Interpretation: abnormal     ECG rate:  114   ECG rate assessment: tachycardic     Rhythm: atrial flutter     Ectopy: none     Conduction: normal   .Critical Care Performed by: Vladimir Crofts, MD Authorized by: Vladimir Crofts, MD   Critical care provider statement:    Critical care time (minutes):  30   Critical care was necessary to treat or prevent imminent or life-threatening deterioration of the  following conditions:  Cardiac failure   Critical care was time spent personally by me on the following activities:  Discussions with consultants, evaluation of patient's response to treatment, examination of patient, ordering and performing treatments and interventions, ordering and review of laboratory studies, ordering and review of radiographic studies,  pulse oximetry, re-evaluation of patient's condition, obtaining history from patient or surrogate and review of old charts    Medications  oxyCODONE-acetaminophen (PERCOCET/ROXICET) 5-325 MG per tablet 1 tablet (has no administration in time range)  acetaminophen (TYLENOL) tablet 650 mg (has no administration in time range)  hydrALAZINE (APRESOLINE) injection 5 mg (has no administration in time range)  insulin aspart (novoLOG) injection 0-9 Units (has no administration in time range)  insulin aspart (novoLOG) injection 0-5 Units (has no administration in time range)  amiodarone (PACERONE) tablet 200 mg (200 mg Oral Given 08/22/20 0931)  metoprolol tartrate (LOPRESSOR) tablet 50 mg (50 mg Oral Given 08/22/20 0931)  furosemide (LASIX) tablet 40 mg (40 mg Oral Given 08/22/20 1014)  acetaminophen (TYLENOL) tablet 1,000 mg (1,000 mg Oral Given 08/22/20 1157)  methocarbamol (ROBAXIN) tablet 500 mg (500 mg Oral Given 08/22/20 1157)  amoxicillin-clavulanate (AUGMENTIN) 875-125 MG per tablet 1 tablet (1 tablet Oral Given 08/22/20 1157)  metoprolol tartrate (LOPRESSOR) injection 5 mg (5 mg Intravenous Given 08/22/20 1231)    ____________________________________________   MDM / ED COURSE   65 year old woman returns to the ED after being discharged 3 days ago due to recurrent falls, without evidence of significant trauma, but with persistent atrial flutter with rapid rates requiring medical admission for possible subsequent placement.  Maintains him to make stability, but is tachycardic in atrial flutter with rates 100-120.  Provide her her morning  medications, because she has not taken them yet today, but she maintains tachycardic rates and therefore requires IV metoprolol for improved rate control.  Work-up is largely benign without evidence of ICH, CVA, hip or pelvic fractures to her site of pain.  No evidence of volume overload or significant CHF exacerbation.  Provide her last dose of previously prescribed Augmentin for UTI from last week.  Due to her continued rapid rates, and likely need for rehab/SNF placement, we will admit to hospitalist   Clinical Course as of 08/22/20 [place] Aug 22, 2020  1003 Reassessed.  Patient's brother is now the bedside and they report that if patient were to be discharged she would be going to his house for assistance at home, and they have home health PT lined up for tomorrow.  We discussed fairly benign work-up, but her persistent mild tachycardia.  We discussed the possibility of outpatient management versus observation admission.  We discussed the possibility of of rehab thereafter versus returning home with home health.  Patient and brother wish to discuss among themselves where to go from here.  Patient reports she feels well and is requesting food again.  Denies pain. [DS]  [id number].  Patient requesting admission for rehab placement.  She remains tachycardic with rates in the 100s.  We discussed observation medical admission.  She is agreeable.  I paged hospitalist [DS]  1222 Continued tachycardia despite her home dosing.  We will provide IV metoprolol for improved rate control and discussed the case with hospitalist for admission and ultimate SNF placement, likely [DS]    Clinical Course User Index [DS] Vladimir Crofts, MD    ____________________________________________   FINAL CLINICAL IMPRESSION(S) / ED DIAGNOSES  Final diagnoses:  Recurrent falls  Atrial flutter with rapid ventricular response [facility])     ED Discharge Orders    None       Jersee Winiarski   Note:  This document  was prepared using Dragon voice recognition software and may include unintentional dictation errors.   Vladimir Crofts, MD 08/22/20 1259

## 2020-08-22 NOTE — ED Triage Notes (Addendum)
Pt comes via EMS from home with c/o multiple falls today. Pt states she has fallen 3x and has pain to left arm and neck.  EMS reports pt was recently dc from hospital and dx with infection of some sort. Pt prescribed antibiotics.  Pt has left foot with bandage in place.  EMS reports pt was little tachy, RR-22, Temp-97, CBG-147

## 2020-08-22 NOTE — Consult Note (Signed)
[facility] Nurse Consult Note: Reason for Consult:Chronic, nonhealing left foot wounds.  Followed by the outpatient wound care center.  Last seen by my partner D. Engels on 08/12/20. Wound type:Neuropathic, full thickness Pressure Injury POA: N/A Measurement: Left plantar site of great toe: 0.2cm x 0.2cm x 0.2cm. Red, moist with scant drainage Left medial foot:  Patch of dry epidermis measuring 1cm round with no depth. Wound bed:As described above Drainage (amount, consistency, odor) as described above Periwound:Feet are dry and peeling Dressing procedure/placement/frequency: I have provided guidance for Nursing to wash and dry feet daily and to cover lesions with a xeroform gauze topped with dry gauze and secured with Kerlix roll gauze/paper tape.  Feet are to be placed into Prevalon Boots.  Patient to follow with the outpatient [facility] if she returns to the community.  Short nursing team will not follow, but will remain available to this patient, the nursing and medical teams.  Please re-consult if needed. Thanks, Maudie Flakes, MSN, RN, [place], Arther Abbott  Pager# [phone]

## 2020-08-23 DIAGNOSIS — W19XXXA Unspecified fall, initial encounter: Secondary | ICD-10-CM | POA: Diagnosis not present

## 2020-08-23 DIAGNOSIS — Y92009 Unspecified place in unspecified non-institutional (private) residence as the place of occurrence of the external cause: Secondary | ICD-10-CM | POA: Diagnosis not present

## 2020-08-23 DIAGNOSIS — E1129 Type 2 diabetes mellitus with other diabetic kidney complication: Secondary | ICD-10-CM | POA: Diagnosis not present

## 2020-08-23 DIAGNOSIS — Z794 Long term (current) use of insulin: Secondary | ICD-10-CM

## 2020-08-23 DIAGNOSIS — I4891 Unspecified atrial fibrillation: Secondary | ICD-10-CM | POA: Diagnosis not present

## 2020-08-23 LAB — GLUCOSE, CAPILLARY
Glucose-Capillary: 237 mg/dL — ABNORMAL HIGH (ref 70–99)
Glucose-Capillary: 337 mg/dL — ABNORMAL HIGH (ref 70–99)
Glucose-Capillary: 186 mg/dL — ABNORMAL HIGH (ref 70–99)
Glucose-Capillary: 148 mg/dL — ABNORMAL HIGH (ref 70–99)

## 2020-08-23 LAB — BASIC METABOLIC PANEL
Anion gap: 10 (ref 5–15)
BUN: 14 mg/dL (ref 8–23)
CO2: 28 mmol/L (ref 22–32)
Calcium: 8.1 mg/dL — ABNORMAL LOW (ref 8.9–10.3)
Chloride: 99 mmol/L (ref 98–111)
Creatinine, Ser: 1.06 mg/dL — ABNORMAL HIGH (ref 0.44–1.00)
GFR, Estimated: 58 mL/min — ABNORMAL LOW (ref >60)
Glucose, Bld: 190 mg/dL — ABNORMAL HIGH (ref 70–99)
Potassium: 3.9 mmol/L (ref 3.5–5.1)
Sodium: 137 mmol/L (ref 135–145)

## 2020-08-23 LAB — CBC
HCT: 41.6 % (ref 36.0–46.0)
Hemoglobin: 13.8 g/dL (ref 12.0–15.0)
MCH: 25.1 pg — ABNORMAL LOW (ref 26.0–34.0)
MCHC: 33.2 g/dL (ref 30.0–36.0)
MCV: 75.6 fL — ABNORMAL LOW (ref 80.0–100.0)
Platelets: 453 10*3/uL — ABNORMAL HIGH (ref 150–400)
RBC: 5.5 MIL/uL — ABNORMAL HIGH (ref 3.87–5.11)
RDW: 22.6 % — ABNORMAL HIGH (ref 11.5–15.5)
WBC: 9.4 10*3/uL (ref 4.0–10.5)
nRBC: 0 % (ref 0.0–0.2)

## 2020-08-23 LAB — MAGNESIUM: Magnesium: 1.7 mg/dL (ref 1.7–2.4)

## 2020-08-23 LAB — SARS CORONAVIRUS 2 (TAT 6-24 HRS): SARS Coronavirus 2: NEGATIVE

## 2020-08-23 LAB — HIV ANTIBODY (ROUTINE TESTING W REFLEX): HIV Screen 4th Generation wRfx: NONREACTIVE

## 2020-08-23 LAB — TSH: TSH: 2.314 u[IU]/mL (ref 0.350–4.500)

## 2020-08-23 MED ORDER — SODIUM CHLORIDE 0.9 % IV BOLUS
1000.0000 mL | Freq: Once | INTRAVENOUS | Status: AC
  Administered 2020-08-23: 1000 mL via INTRAVENOUS

## 2020-08-23 MED ORDER — HYDROCERIN EX CREA
TOPICAL_CREAM | Freq: Two times a day (BID) | CUTANEOUS | Status: DC
  Filled 2020-08-23: qty 113

## 2020-08-23 NOTE — Progress Notes (Signed)
BP 84/44 MAP, pt dizzy. MD made aware/orders to hold any bp lowering meds and give 1L bolus NS. Will continue to monitor.

## 2020-08-23 NOTE — Progress Notes (Deleted)
   08/23/20 1144  Assess: MEWS Score  Temp 97.8 F (36.6 C)  BP (!) 84/44  Pulse Rate (!) 109  Resp 18  O2 Device Room Air  Assess: MEWS Score  MEWS Temp 0  MEWS Systolic 1  MEWS Pulse 1  MEWS RR 0  MEWS LOC 0  MEWS Score 2  MEWS Score Color Yellow  Assess: if the MEWS score is Yellow or Red  Were vital signs taken at a resting state? Yes  Focused Assessment No change from prior assessment  Early Detection of Sepsis Score *See Row Information* Low  MEWS guidelines implemented *See Row Information* Yes  Treat  MEWS Interventions Escalated (See documentation below)  Pain Scale 0-10  Pain Score 0  Take Vital Signs  Increase Vital Sign Frequency  Yellow: Q 2hr X 2 then Q 4hr X 2, if remains yellow, continue Q 4hrs  Escalate  MEWS: Escalate Yellow: discuss with charge nurse/RN and consider discussing with provider and RRT  Notify: Charge Nurse/RN  Name of Charge Nurse/RN Notified Colletta [place],  Date Charge Nurse/RN Notified 08/23/20  Time Charge Nurse/RN Notified 1200  Notify: Provider  Provider Name/Title J.Jimmye Norman  Date Provider Notified 08/23/20  Time Provider Notified 1145  Notification Type Page  Notification Reason Change in status  Provider response See new orders  Date of Provider Response 08/23/20  Time of Provider Response 2426  Document  Patient Outcome Stabilized after interventions  Progress note created (see row info) Yes

## 2020-08-23 NOTE — Evaluation (Signed)
Physical Therapy Evaluation Patient Details Name: Melinda [name] MRN: [id number] DOB: 02-03-1956 Today's Date: 08/23/2020   History of Present Illness  Pt admitted for falls at home. Recent admission from 4/13-4/21 secondary to sepsis. HIstory includes HTN, HLD, DM, GERD, depression, anxiety, CAD, MI, and afib.  Clinical Impression  Pt is a pleasant 65 year old female who was admitted for falls at home. Currently being ruled out for covid, on airborne iso at this time. Pt received seated at EOB upon arrival. Pt then able to perform transfers/ambulation with min assist. 1st attempt without AD (baseline), however very unsteady. Pt unaware of weakness and needed this writer to assist her to sit on bed for fear of falling. 2nd attempt with RW with improved balance. Able to ambulate short distance to recliner, however fatigues quickly. Pt demonstrates deficits with strength/mobility/safety. Pt is currently not at baseline level. Would benefit from skilled PT to address above deficits and promote optimal return to PLOF; recommend transition to STR upon discharge from acute hospitalization.    Follow Up Recommendations SNF    Equipment Recommendations  Rolling walker with 5" wheels    Recommendations for Other Services       Precautions / Restrictions Precautions Precautions: Fall Restrictions Weight Bearing Restrictions: No      Mobility  Bed Mobility               General bed mobility comments: not performed as received seated at EOB    Transfers Overall transfer level: Needs assistance Equipment used: None Transfers: Sit to/from Stand Sit to Stand: Min assist         General transfer comment: struggles without physical assist. Unsteady once standing. 2nd attempt with RW with improved balance.  Ambulation/Gait Ambulation/Gait assistance: Min assist Gait Distance (Feet): 5 Feet Assistive device: Rolling walker (2 wheeled) Gait Pattern/deviations: Step-to pattern      General Gait Details: attempted to ambulate without AD, unable to take steps. RW given with min assist to ambulate over to recliner. HR increases to 125bpm with exertion  Stairs            Wheelchair Mobility    Modified Rankin (Stroke Patients Only)       Balance Overall balance assessment: Needs assistance Sitting-balance support: Feet supported;No upper extremity supported Sitting balance-Leahy Scale: Good     Standing balance support: No upper extremity supported Standing balance-Leahy Scale: Poor                               Pertinent Vitals/Pain Pain Assessment: No/denies pain    Home Living Family/patient expects to be discharged to:: Private residence Living Arrangements: Alone (recently staying with sister s/p most recent admission) Available Help at Discharge: Friend(s);Family Type of Home: House Home Access: Stairs to enter Entrance Stairs-Rails: Psychiatric nurse of Steps: 5-6 Home Layout: One level Home Equipment: Simpson - quad;Shower seat;Grab bars - tub/shower      Prior Function Level of Independence: Independent         Comments: reports she hasn't needed to use quad cane recently but then admits to more frequent falls.     Hand Dominance        Extremity/Trunk Assessment   Upper Extremity Assessment Upper Extremity Assessment: Generalized weakness (B UE grossly 4/5)    Lower Extremity Assessment Lower Extremity Assessment: Generalized weakness (B LE grossly 3/5)       Communication   Communication: No difficulties  Cognition Arousal/Alertness: Awake/alert Behavior During Therapy: WFL for tasks assessed/performed Overall Cognitive Status: Within Functional Limits for tasks assessed                                        General Comments      Exercises Other Exercises Other Exercises: seated ther-ex performed x 15 reps including LAQ and alt. marching. Encouraged to continue  while in recliner to improve strength. Cues given for sequencing. Supervision   Assessment/Plan    PT Assessment Patient needs continued PT services  PT Problem List Decreased strength;Decreased activity tolerance;Decreased mobility;Decreased balance       PT Treatment Interventions DME instruction;Balance training;Gait training;Neuromuscular re-education;Stair training;Functional mobility training;Patient/family education;Therapeutic activities;Therapeutic exercise    PT Goals (Current goals can be found in the Care Plan section)  Acute Rehab PT Goals Patient Stated Goal: to go to rehab PT Goal Formulation: With patient Time For Goal Achievement: 09/06/20 Potential to Achieve Goals: Good    Frequency Min 2X/week   Barriers to discharge Decreased caregiver support      Co-evaluation               AM-PAC PT "6 Clicks" Mobility  Outcome Measure Help needed turning from your back to your side while in a flat bed without using bedrails?: A Little Help needed moving from lying on your back to sitting on the side of a flat bed without using bedrails?: A Little Help needed moving to and from a bed to a chair (including a wheelchair)?: A Little Help needed standing up from a chair using your arms (e.g., wheelchair or bedside chair)?: A Little Help needed to walk in hospital room?: A Little Help needed climbing 3-5 steps with a railing? : A Lot 6 Click Score: 17    End of Session Equipment Utilized During Treatment: Gait belt Activity Tolerance: Patient tolerated treatment well Patient left: in chair;with chair alarm set Nurse Communication: Mobility status PT Visit Diagnosis: Muscle weakness (generalized) (M62.81);Difficulty in walking, not elsewhere classified (R26.2);Other abnormalities of gait and mobility (R26.89)    Time: 0950-1010 PT Time Calculation (min) (ACUTE ONLY): 20 min   Charges:   PT Evaluation $PT Eval Low Complexity: 1 Low PT Treatments $Therapeutic  Exercise: 8-22 mins        Melinda [name], PT, DPT [phone]   Melinda [name] 08/23/2020, 12:51 PM

## 2020-08-23 NOTE — Progress Notes (Signed)
OT Cancellation Note  Patient Details Name: Melinda [name] MRN: [id number] DOB: Jan 21, 1956   Cancelled Treatment:    Reason Eval/Treat Not Completed: Other (comment). Consult received, chart reviewed. Upon attempt, pt on [facility] with RN. RN reporting BP low. OT assisted pt back to bed with RN. Will re-attempt OT evaluation later today after pt has had fluids and will monitor BP. RN in agreement with plan.   Hanley Hays, MPH, MS, OTR/L ascom [phone] 08/23/20, 12:03 PM

## 2020-08-23 NOTE — Evaluation (Signed)
Occupational Therapy Evaluation Patient Details Name: Melinda [name] MRN: [id number] DOB: August 20, 1955 Today's Date: 08/23/2020    History of Present Illness Pt admitted for falls at home. Recent admission from 4/13-4/21 secondary to sepsis. HIstory includes HTN, HLD, DM, GERD, depression, anxiety, CAD, MI, and afib.   Clinical Impression   Pt was seen for OT evaluation this date. Prior to hospital admission, pt was staying with her sister after recent admission and endorses multiple falls 2/2 BLE weakness. Pt normally lives by herself and does not use AD for mobility. Pt seated EOB with family present upon OT's arrival. Pt agreeable to OT evaluation. Pt denies SOB, pain, or dizziness/whooziness seated EOB. Sitting BP 110/65. CGA for STS with RW and BP standing 16min dropped to 75/62 with pt endorsing increasing BLE weakness as she stood, ultimately requiring to sit back down prior to ability to get 2min standing BP with 2 standing attempts and rest break in between. RN and MD notified. Pt/family educated in positional changes and impact on BP, educated in falls prevention strategies for ADL and ADL mobility. Currently pt demonstrates impairments in BP, strength, balance, and activity tolerance as described below (See OT problem list) which functionally limit her ability to perform ADL/self-care tasks safely and at baseline independence. Pt would benefit from skilled OT services to address noted impairments and functional limitations (see below for any additional details) in order to maximize safety and independence while minimizing falls risk and caregiver burden. Upon hospital discharge, recommend short term rehab at SNF to maximize pt safety and return to PLOF.     Follow Up Recommendations  SNF    Equipment Recommendations  3 in 1 bedside commode    Recommendations for Other Services       Precautions / Restrictions Precautions Precautions: Fall Precaution Comments: monitor  orthostatics Restrictions Weight Bearing Restrictions: No      Mobility Bed Mobility               General bed mobility comments: not performed as received seated at EOB    Transfers Overall transfer level: Needs assistance Equipment used: Rolling walker (2 wheeled) Transfers: Sit to/from Stand Sit to Stand: Min guard         General transfer comment: increased time/effort to perform    Balance Overall balance assessment: Needs assistance Sitting-balance support: Feet supported;No upper extremity supported Sitting balance-Leahy Scale: Good     Standing balance support: Single extremity supported Standing balance-Leahy Scale: Fair Standing balance comment: UE support on RW while LUE relaxed for BP in standing                           ADL either performed or assessed with clinical judgement   ADL Overall ADL's : Needs assistance/impaired                                       General ADL Comments: In sitting, pt able to doff/don LB dressing without assist, supervision for safety, CGA with RW for ADL transfers, PRN assist for pericare in standing, and unable to tolerate standing more than 2-39min 2/2 BLE weakness     Vision Patient Visual Report: No change from baseline       Perception     Praxis      Pertinent Vitals/Pain Pain Assessment: No/denies pain     Hand Dominance Right  Extremity/Trunk Assessment Upper Extremity Assessment Upper Extremity Assessment: Generalized weakness   Lower Extremity Assessment Lower Extremity Assessment: Generalized weakness       Communication Communication Communication: No difficulties   Cognition Arousal/Alertness: Awake/alert Behavior During Therapy: WFL for tasks assessed/performed Overall Cognitive Status: Within Functional Limits for tasks assessed                                 General Comments: Requires increased processing time   General Comments  Seated  BP 110/65, standing 76min BP 75/62, unable to tolerate standing for 57min. 2nd sitting BP 107/70, standing 78min 80/61 and again pt unable to tolerate standing 58min    Exercises Other Exercises: Pt/family educated in positional changes and impact on BP, educated in falls prevention strategies for ADL and ADL mobility   Shoulder Instructions      Home Living Family/patient expects to be discharged to:: Private residence Living Arrangements: Alone (recently staying with sister s/p most recent admission) Available Help at Discharge: Friend(s);Family Type of Home: House Home Access: Stairs to enter [employer] of Steps: 5-6 Entrance Stairs-Rails: Right;Left Home Layout: One level     Bathroom Shower/Tub: Tub/shower unit;Walk-in shower   Bathroom Toilet: Standard Bathroom Accessibility: Yes   Home Equipment: Cane - quad;Shower seat;Grab bars - tub/shower;Walker - 2 wheels          Prior Functioning/Environment Level of Independence: Independent        Comments: reports she hasn't needed to use quad cane recently but then admits to more frequent falls.        OT Problem List: Decreased strength;Cardiopulmonary status limiting activity;Decreased activity tolerance;Impaired balance (sitting and/or standing);Decreased knowledge of use of DME or AE;Decreased safety awareness;Impaired UE functional use      OT Treatment/Interventions: Self-care/ADL training;Therapeutic exercise;Energy conservation;DME and/or AE instruction;Therapeutic activities;Patient/family education;Balance training    OT Goals(Current goals can be found in the care plan section) Acute Rehab OT Goals Patient Stated Goal: to go to rehab OT Goal Formulation: With patient/family Time For Goal Achievement: 09/06/20 Potential to Achieve Goals: Good ADL Goals Pt Will Transfer to Toilet: with modified independence;ambulating;bedside commode (LRAD) Additional ADL Goal #1: Pt will perform morning ADL routine  with supervision assist and utilizing use of learned falls prevention strategies to mitigate BLE weakness 2/2 orthostatic hypotension with positional changes. Additional ADL Goal #2: Pt will verbalize plan to implement at least 2 learned energy conservation strategies at home to minimize future falls risk  OT Frequency: Min 2X/week   Barriers to D/C:            Co-evaluation              AM-PAC OT "6 Clicks" Daily Activity     Outcome Measure Help from another person eating meals?: None Help from another person taking care of personal grooming?: A Little Help from another person toileting, which includes using toliet, bedpan, or urinal?: A Little Help from another person bathing (including washing, rinsing, drying)?: A Little Help from another person to put on and taking off regular upper body clothing?: A Little Help from another person to put on and taking off regular lower body clothing?: A Little 6 Click Score: 19   End of Session Nurse Communication: Mobility status;Other (comment) (orthostatics)  Activity Tolerance: Treatment limited secondary to medical complications (Comment) (limited by orthostatic vitals) Patient left:    OT Visit Diagnosis: Unsteadiness on feet (R26.81);Other abnormalities of gait and  mobility (R26.89);Repeated falls (R29.6);Muscle weakness (generalized) (M62.81)                Time: 6314-9702 OT Time Calculation (min): 32 min Charges:  OT General Charges $OT Visit: 1 Visit OT Evaluation $OT Eval Moderate Complexity: 1 Mod OT Treatments $Self Care/Home Management : 8-22 mins $Therapeutic Activity: 8-22 mins  Hanley Hays, MPH, MS, OTR/L ascom [phone] 08/23/20, 4:05 PM

## 2020-08-23 NOTE — Progress Notes (Signed)
PROGRESS NOTE    Melinda [name]  MHD:[id number] DOB: January 21, 1956 DOA: 08/22/2020 PCP: Donnie Coffin, MD  Assessment & Plan:   Principal Problem:   Fall at home, initial encounter Active Problems:   Hypertension   Type II diabetes mellitus with renal manifestations ([place])   Gastroesophageal reflux disease   Claudication in peripheral vascular disease ([place])   Hyperlipidemia LDL goal <70   CAD (coronary artery disease), native coronary artery   Atrial fibrillation with RVR ([facility])   CKD (chronic kidney disease), stage IIIa   Depression   Chronic systolic CHF (congestive heart failure) ([place])   Iron deficiency anemia    Fall: at home. CT head negative.  CT of C-spine is negative for acute bony fracture, but showed degenerative disc disease.  X-ray of left hip is negative. PT/OT consulted   HTN: continue on metoprolol, amlodipine, lasix. IV hydralazine prn   DM2: poorly controlled w/ HbA1c 8.0. Continue on SSI w/ accuchecks    GERD: continue on PPI  Hx of PVD: w/ hx of claudication. Continue on statin   HLD: continue on statin   Hx of CAD: continue on metoprolol, statin   Likely PAF: w/ RVR. Continue on home dose of metoprolol, amiodarone & eliquis  CKDIIIa: baseline Cr 1.0-1.2.   Depression: severity unknown. Continue on home dose of duloxetine    Chronic systolic CHF: echo on 06/12/9415 showed EF 30 to 35%. Continue on lasix. Monitor I/Os.   IDA: continue on iron supplement   Chronic left foot wound: does not seem to be infected. Wound care consulted   DVT prophylaxis: eliquis Code Status: full  Family Communication: discussed pt's care w/ pt's family and answered their question Disposition Plan: likely d/c to SNF   Level of care: Progressive Cardiac   Status is: Observation  The patient will require care spanning > 2 midnights and should be moved to inpatient because: Unsafe d/c plan, will likely need to go to SNF   Dispo: The patient is from:  Home              Anticipated d/c is to: SNF              Patient currently is not medically stable to d/c.   Difficult to place patient: unclear          Consultants:      Procedures:    Antimicrobials:   Subjective: Pt c/o b/l LE weakness  Objective: Vitals:   08/22/20 2031 08/22/20 2315 08/23/20 0500 08/23/20 0539  BP: (!) 142/77 (!) 141/80  129/76  Pulse: (!) 110 (!) 109  (!) 109  Resp: (!) 21 19  19   Temp:  99 F (37.2 C)  97.8 F (36.6 C)  TempSrc:  Oral  Oral  SpO2: 96% 100%  97%  Weight:  103.7 kg 103.5 kg   Height:  5\' 8"  (1.727 m)     No intake or output data in the 24 hours ending 08/23/20 0808 Filed Weights   08/22/20 0850 08/22/20 2315 08/23/20 0500  Weight: 99.8 kg 103.7 kg 103.5 kg    Examination:  General exam: Appears calm and comfortable  Respiratory system: Clear to auscultation. Respiratory effort normal. Cardiovascular system: S1 & S2 +. No rubs, gallops or clicks.  Gastrointestinal system: Abdomen is obese, soft and nontender.  Normal bowel sounds heard. Central nervous system: Alert and oriented. Moves all extremities  Psychiatry: Judgement and insight appear normal. Mood & affect appropriate.     Data  Reviewed: I have personally reviewed following labs and imaging studies  CBC: Recent Labs  Lab 08/16/20 0901 08/17/20 0410 08/18/20 0526 08/19/20 0735 08/22/20 0858  WBC 30.8* 25.2* 17.9* 13.4* 10.1  NEUTROABS  --   --   --  11.0* 7.1  HGB 14.9 15.0 14.6 12.8 13.2  HCT 45.7 45.3 44.4 38.0 39.7  MCV 75.7* 74.8* 75.6* 73.1* 73.7*  PLT 317 303 303 323 024   Basic Metabolic Panel: Recent Labs  Lab 08/17/20 0410 08/18/20 0526 08/19/20 0735 08/22/20 0858 08/23/20 0609  NA 145 146* 139 137 137  K 4.0 3.6 3.4* 3.9 3.9  CL 101 106 102 98 99  CO2 29 29 27 28 28   GLUCOSE 216* 196* 235* 164* 190*  BUN 21 17 12 17 14   CREATININE 1.13* 0.94 0.93 1.31* 1.06*  CALCIUM 8.8* 8.8* 8.2* 8.5* 8.1*  MG  --   --   --  1.7  --     GFR: Estimated Creatinine Clearance: 65.7 mL/min (A) (by C-G formula based on SCr of 1.06 mg/dL (H)). Liver Function Tests: Recent Labs  Lab 08/16/20 0900 08/17/20 0410 08/22/20 0858  AST 24 30 50*  ALT 32 31 46*  ALKPHOS 115 131* 108  BILITOT 1.3* 1.1 1.1  PROT 7.3 6.7 6.8  ALBUMIN 2.7* 2.4* 2.4*   No results for input(s): LIPASE, AMYLASE in the last 168 hours. Recent Labs  Lab 08/16/20 0901  AMMONIA 10   Coagulation Profile: No results for input(s): INR, PROTIME in the last 168 hours. Cardiac Enzymes: No results for input(s): CKTOTAL, CKMB, CKMBINDEX, TROPONINI in the last 168 hours. BNP (last 3 results) No results for input(s): PROBNP in the last 8760 hours. HbA1C: No results for input(s): HGBA1C in the last 72 hours. CBG: Recent Labs  Lab 08/22/20 1713 08/22/20 2217 08/22/20 2330  GLUCAP 223* 215* 270*   Lipid Profile: No results for input(s): CHOL, HDL, LDLCALC, TRIG, CHOLHDL, LDLDIRECT in the last 72 hours. Thyroid Function Tests: No results for input(s): TSH, T4TOTAL, FREET4, T3FREE, THYROIDAB in the last 72 hours. Anemia Panel: No results for input(s): VITAMINB12, FOLATE, FERRITIN, TIBC, IRON, RETICCTPCT in the last 72 hours. Sepsis Labs: Recent Labs  Lab 08/22/20 0850  LATICACIDVEN 1.7    Recent Results (from the past 240 hour(s))  Urine Culture     Status: None   Collection Time: 08/16/20 12:13 PM   Specimen: Urine, Random  Result Value Ref Range Status   Specimen Description   Final    URINE, RANDOM Performed at [facility], [address], [place], [place] [address]    Special Requests   Final    NONE Performed at [facility], [address] [place], [place] [address]    Culture   Final    NO GROWTH Performed at [facility], [place] [address] [place], [place] [address]    Report Status 08/18/2020 FINAL  Final         Radiology Studies: CT Head Wo Contrast  Result Date: 08/22/2020 CLINICAL DATA:   Multiple falls. On anticoagulation. Head trauma. Left-sided neck pain. EXAM: CT HEAD WITHOUT CONTRAST CT CERVICAL SPINE WITHOUT CONTRAST TECHNIQUE: Multidetector CT imaging of the head and cervical spine was performed following the standard protocol without intravenous contrast. Multiplanar CT image reconstructions of the cervical spine were also generated. COMPARISON:  08/11/2020, 08/16/2020 FINDINGS: CT HEAD FINDINGS Brain: No evidence of acute infarction, hemorrhage, hydrocephalus, extra-axial collection or mass lesion/mass effect. Scattered low-density changes within  the periventricular and subcortical white matter compatible with chronic microvascular ischemic change. Vascular: Atherosclerotic calcifications involving the large vessels of the skull base. No unexpected hyperdense vessel. Skull: Normal. Negative for fracture or focal lesion. Sinuses/Orbits: No acute finding. Other: Negative for scalp hematoma CT CERVICAL SPINE FINDINGS Alignment: Facet joints are aligned without dislocation or traumatic listhesis. Dens and lateral masses are aligned. Skull base and vertebrae: No acute fracture. No primary bone lesion or focal pathologic process. Soft tissues and spinal canal: No prevertebral fluid or swelling. No visible canal hematoma. Disc levels: Moderate multilevel cervical spondylosis, which appears most pronounced at the C3-4 through C5-6 levels. No progression from prior. Upper chest: Included lung apices are clear Other: Degenerative changes of the bilateral sternoclavicular joints. Bilateral carotid atherosclerosis. IMPRESSION: 1. No acute intracranial findings. 2. No evidence of acute fracture or traumatic listhesis of the cervical spine. 3. Moderate multilevel cervical spondylosis, most pronounced at the C3-4 through C5-6 levels. Electronically Signed   By: Davina Poke D.O.   On: 08/22/2020 09:20   CT Cervical Spine Wo Contrast  Result Date: 08/22/2020 CLINICAL DATA:  Multiple falls. On  anticoagulation. Head trauma. Left-sided neck pain. EXAM: CT HEAD WITHOUT CONTRAST CT CERVICAL SPINE WITHOUT CONTRAST TECHNIQUE: Multidetector CT imaging of the head and cervical spine was performed following the standard protocol without intravenous contrast. Multiplanar CT image reconstructions of the cervical spine were also generated. COMPARISON:  08/11/2020, 08/16/2020 FINDINGS: CT HEAD FINDINGS Brain: No evidence of acute infarction, hemorrhage, hydrocephalus, extra-axial collection or mass lesion/mass effect. Scattered low-density changes within the periventricular and subcortical white matter compatible with chronic microvascular ischemic change. Vascular: Atherosclerotic calcifications involving the large vessels of the skull base. No unexpected hyperdense vessel. Skull: Normal. Negative for fracture or focal lesion. Sinuses/Orbits: No acute finding. Other: Negative for scalp hematoma CT CERVICAL SPINE FINDINGS Alignment: Facet joints are aligned without dislocation or traumatic listhesis. Dens and lateral masses are aligned. Skull base and vertebrae: No acute fracture. No primary bone lesion or focal pathologic process. Soft tissues and spinal canal: No prevertebral fluid or swelling. No visible canal hematoma. Disc levels: Moderate multilevel cervical spondylosis, which appears most pronounced at the C3-4 through C5-6 levels. No progression from prior. Upper chest: Included lung apices are clear Other: Degenerative changes of the bilateral sternoclavicular joints. Bilateral carotid atherosclerosis. IMPRESSION: 1. No acute intracranial findings. 2. No evidence of acute fracture or traumatic listhesis of the cervical spine. 3. Moderate multilevel cervical spondylosis, most pronounced at the C3-4 through C5-6 levels. Electronically Signed   By: Davina Poke D.O.   On: 08/22/2020 09:20   DG Chest Portable 1 View  Result Date: 08/22/2020 CLINICAL DATA:  Recurrent falls. Coronary artery disease. Left  hip pain. EXAM: PORTABLE CHEST 1 VIEW COMPARISON:  08/15/2020 FINDINGS: Heart size is within normal limits. Prior CABG again noted. No evidence of pulmonary infiltrate or edema. No evidence of pleural effusion. IMPRESSION: No active disease. Electronically Signed   By: Marlaine Hind M.D.   On: 08/22/2020 09:48   DG Hip Unilat W or Wo Pelvis 2-3 Views Left  Result Date: 08/22/2020 CLINICAL DATA:  Recurrent fall.  Left hip pain. EXAM: DG HIP (WITH OR WITHOUT PELVIS) 2-3V LEFT COMPARISON:  None. FINDINGS: Stable chronic deformity of the right hip. No signs of acute left hip fracture or dislocation. Bilateral hip osteoarthritis. Extensive vascular calcifications noted. IMPRESSION: 1. No acute findings. 2. Chronic deformity of the right hip. Electronically Signed   By: Queen Slough.D.  On: 08/22/2020 09:46        Scheduled Meds: . amiodarone  200 mg Oral BID  . amLODipine  5 mg Oral Daily  . apixaban  5 mg Oral BID  . atorvastatin  80 mg Oral q1800  . cholecalciferol  2,000 Units Oral Daily  . docusate sodium  100 mg Oral BID  . DULoxetine  60 mg Oral Daily  . ferrous sulfate  325 mg Oral Daily  . furosemide  40 mg Oral Daily  . hydrocerin   Topical BID  . insulin aspart  0-5 Units Subcutaneous QHS  . insulin aspart  0-9 Units Subcutaneous TID WC  . metoprolol tartrate  50 mg Oral BID  . multivitamin with minerals  2 tablet Oral Daily  . oxybutynin  5 mg Oral BID  . pantoprazole  40 mg Oral Daily  . pregabalin  150 mg Oral TID  . sodium chloride flush  10 mL Intravenous Q12H  . tiZANidine  4 mg Oral TID  . varenicline  1 mg Oral Daily   Continuous Infusions:   LOS: 0 days    Time spent: 32 mins     Wyvonnia Dusky, MD [facility] Pager [phone]  If 7PM-7AM, please contact night-coverage 08/23/2020, 8:08 AM

## 2020-08-23 NOTE — Progress Notes (Signed)
   08/23/20 1144  Assess: MEWS Score  Temp 97.8 F (36.6 C)  BP (!) 84/44  Pulse Rate (!) 109  Resp 18  O2 Device Room Air  Assess: MEWS Score  MEWS Temp 0  MEWS Systolic 1  MEWS Pulse 1  MEWS RR 0  MEWS LOC 0  MEWS Score 2  MEWS Score Color Yellow  Assess: if the MEWS score is Yellow or Red  Were vital signs taken at a resting state? Yes  Focused Assessment No change from prior assessment  Early Detection of Sepsis Score *See Row Information* Low  MEWS guidelines implemented *See Row Information* Yes  Treat  MEWS Interventions Escalated (See documentation below)  Pain Scale 0-10  Pain Score 0  Take Vital Signs  Increase Vital Sign Frequency  Yellow: Q 2hr X 2 then Q 4hr X 2, if remains yellow, continue Q 4hrs  Escalate  MEWS: Escalate Yellow: discuss with charge nurse/RN and consider discussing with provider and RRT  Notify: Charge Nurse/RN  Name of Charge Nurse/RN Notified Colletta Maryland,  Date Charge Nurse/RN Notified 08/23/20  Time Charge Nurse/RN Notified 1200  Notify: Provider  Provider Name/Title J.Jimmye Norman  Date Provider Notified 08/23/20  Time Provider Notified 3614  Notification Type Page  Notification Reason Change in status  Provider response See new orders  Date of Provider Response 08/23/20  Time of Provider Response 4315  Document  Patient Outcome Stabilized after interventions  Progress note created (see row info) Yes

## 2020-08-23 NOTE — Plan of Care (Signed)
  Problem: Education: Goal: Knowledge of General Education information will improve Description: Including pain rating scale, medication(s)/side effects and non-pharmacologic comfort measures Outcome: Progressing Note: Patient profile completed, no complaints of pain. Wounds cleansed and dressed.

## 2020-08-24 DIAGNOSIS — I4891 Unspecified atrial fibrillation: Secondary | ICD-10-CM | POA: Diagnosis not present

## 2020-08-24 DIAGNOSIS — N179 Acute kidney failure, unspecified: Secondary | ICD-10-CM | POA: Diagnosis not present

## 2020-08-24 DIAGNOSIS — W19XXXA Unspecified fall, initial encounter: Secondary | ICD-10-CM | POA: Diagnosis not present

## 2020-08-24 DIAGNOSIS — Y92009 Unspecified place in unspecified non-institutional (private) residence as the place of occurrence of the external cause: Secondary | ICD-10-CM | POA: Diagnosis not present

## 2020-08-24 LAB — COMPREHENSIVE METABOLIC PANEL
ALT: 33 U/L (ref 0–44)
AST: 33 U/L (ref 15–41)
Albumin: 2 g/dL — ABNORMAL LOW (ref 3.5–5.0)
Alkaline Phosphatase: 100 U/L (ref 38–126)
Anion gap: 8 (ref 5–15)
BUN: 16 mg/dL (ref 8–23)
CO2: 27 mmol/L (ref 22–32)
Calcium: 8 mg/dL — ABNORMAL LOW (ref 8.9–10.3)
Chloride: 100 mmol/L (ref 98–111)
Creatinine, Ser: 1.44 mg/dL — ABNORMAL HIGH (ref 0.44–1.00)
GFR, Estimated: 40 mL/min — ABNORMAL LOW (ref >60)
Glucose, Bld: 304 mg/dL — ABNORMAL HIGH (ref 70–99)
Potassium: 4.3 mmol/L (ref 3.5–5.1)
Sodium: 135 mmol/L (ref 135–145)
Total Bilirubin: 0.6 mg/dL (ref 0.3–1.2)
Total Protein: 5.5 g/dL — ABNORMAL LOW (ref 6.5–8.1)

## 2020-08-24 LAB — CBC
HCT: 35.5 % — ABNORMAL LOW (ref 36.0–46.0)
Hemoglobin: 11.5 g/dL — ABNORMAL LOW (ref 12.0–15.0)
MCH: 24.7 pg — ABNORMAL LOW (ref 26.0–34.0)
MCHC: 32.4 g/dL (ref 30.0–36.0)
MCV: 76.3 fL — ABNORMAL LOW (ref 80.0–100.0)
Platelets: 391 10*3/uL (ref 150–400)
RBC: 4.65 MIL/uL (ref 3.87–5.11)
RDW: 22.3 % — ABNORMAL HIGH (ref 11.5–15.5)
WBC: 9.3 10*3/uL (ref 4.0–10.5)
nRBC: 0 % (ref 0.0–0.2)

## 2020-08-24 LAB — GLUCOSE, CAPILLARY
Glucose-Capillary: 200 mg/dL — ABNORMAL HIGH (ref 70–99)
Glucose-Capillary: 194 mg/dL — ABNORMAL HIGH (ref 70–99)
Glucose-Capillary: 148 mg/dL — ABNORMAL HIGH (ref 70–99)
Glucose-Capillary: 133 mg/dL — ABNORMAL HIGH (ref 70–99)

## 2020-08-24 MED ORDER — INSULIN ASPART 100 UNIT/ML ~~LOC~~ SOLN
3.0000 [IU] | Freq: Three times a day (TID) | SUBCUTANEOUS | Status: DC
  Administered 2020-08-24 – 2020-08-25 (×4): 3 [IU] via SUBCUTANEOUS
  Filled 2020-08-24 (×4): qty 1

## 2020-08-24 NOTE — Progress Notes (Signed)
Physical Therapy Treatment Patient Details Name: Melinda [name] MRN: [id number] DOB: [birth year] Today's Date: 08/24/2020    History of Present Illness Pt admitted for falls at home. Recent admission from 4/13-4/21 secondary to sepsis. HIstory includes HTN, HLD, DM, GERD, depression, anxiety, CAD, MI, and afib.    PT Comments    In chair ready for session.  Stood with min guard and is able to progress gait to door of room and back with RW and min a x 1.  Poor use of walker keeping it too far away and walks outside of walker box for turns with poor correction despite cues.  Knees generally flexed with forward posture during gait but no LOB or buckling.  After seated rest, is able to stand for ex.  Generally fatigued with tasks but motivated to increase functional independence.  SNF remains appropriate.   Follow Up Recommendations  SNF     Equipment Recommendations  Rolling walker with 5" wheels    Recommendations for Other Services       Precautions / Restrictions Precautions Precautions: Fall Precaution Comments: monitor orthostatics Restrictions Weight Bearing Restrictions: No    Mobility  Bed Mobility               General bed mobility comments: in recliner before and after    Transfers Overall transfer level: Needs assistance Equipment used: Rolling walker (2 wheeled) Transfers: Sit to/from Stand Sit to Stand: Min guard            Ambulation/Gait Ambulation/Gait assistance: Min Web designer (Feet): 30 Feet Assistive device: Rolling walker (2 wheeled) Gait Pattern/deviations: Step-through pattern;Decreased step length - right;Decreased step length - left;Trunk flexed Gait velocity: decreased   General Gait Details: amb to/from door with RW and while no buckling or LOB, generally unsafe with poor use of RW   Stairs             Wheelchair Mobility    Modified Rankin (Stroke Patients Only)       Balance Overall balance  assessment: Needs assistance Sitting-balance support: Feet supported;No upper extremity supported Sitting balance-Leahy Scale: Good     Standing balance support: Bilateral upper extremity supported Standing balance-Leahy Scale: Fair                              Cognition Arousal/Alertness: Awake/alert Behavior During Therapy: WFL for tasks assessed/performed Overall Cognitive Status: Within Functional Limits for tasks assessed                                        Exercises Other Exercises Other Exercises: standing marches, SLR and heel raises x 10 with walker for support    General Comments        Pertinent Vitals/Pain Pain Assessment: No/denies pain    Home Living                      Prior Function            PT Goals (current goals can now be found in the care plan section) Progress towards PT goals: Progressing toward goals    Frequency    Min 2X/week      PT Plan Current plan remains appropriate    Co-evaluation              AM-PAC PT "6 Clicks"  Mobility   Outcome Measure  Help needed turning from your back to your side while in a flat bed without using bedrails?: A Little Help needed moving from lying on your back to sitting on the side of a flat bed without using bedrails?: A Little Help needed moving to and from a bed to a chair (including a wheelchair)?: A Little Help needed standing up from a chair using your arms (e.g., wheelchair or bedside chair)?: A Little Help needed to walk in hospital room?: A Little Help needed climbing 3-5 steps with a railing? : A Lot 6 Click Score: 17    End of Session Equipment Utilized During Treatment: Gait belt Activity Tolerance: Patient tolerated treatment well Patient left: in chair;with chair alarm set;with family/visitor present;with call bell/phone within reach Nurse Communication: Mobility status PT Visit Diagnosis: Muscle weakness (generalized)  (M62.81);Difficulty in walking, not elsewhere classified (R26.2);Other abnormalities of gait and mobility (R26.89)     Time: 8563-1497 PT Time Calculation (min) (ACUTE ONLY): 12 min  Charges:  $Gait Training: 8-22 mins                     Chesley Noon, PTA 08/24/20, 3:02 PM

## 2020-08-24 NOTE — Progress Notes (Signed)
PROGRESS NOTE   HPI was taken from Dr. Blaine Hamper: Melinda [name] is a 65 y.o. female with medical history significant of hypertension, hyperlipidemia, diabetes mellitus, GERD, depression, anxiety, former smoker, PVD with toe  amputations, CAD, CABG, myocardial infarction, atrial fibrillation on Eliquis, CHF with EF 30-35%, cocaine abuse, iron deficiency anemia, gastric bypass surgery, CKD stage IIIa, chronic left foot wound, who presents with multiple falls.  Patient was recently hospitalized from 4/13-4/21 because of sepsis due to catheter related UTI, aspiration pneumonia and CHF exacerbation.  Patient was treated with IV antibiotics in hospital, and discharged on Augmentin. She is supposed to take Augmentin from 4/21 to 4/24.  Patient states that after she went home, she continues to have generalized weakness.  She feels so weak that she fell at least 5 times.  She injured her left hip, and hit her head. She has pain in the left hip and her neck.  No unilateral numbness or tingling in extremities.  No facial droop or slurred speech.  Patient has mild dry cough and mild shortness breath, chest pain, fever or chills.  No nausea vomiting, diarrhea or abdominal pain. No symptoms of UTI.  ED Course: pt was found to have WBC 10.1, negative urinalysis, troponin level 7, 7, lactic acid 1.7, pending COVID-19 PCR, renal function slightly worsening than baseline, temperature normal, blood pressure 116/84, A fib/flutter with heart rate of 120s, RR 23, 17, oxygen saturation 88% on room air which improved to 95% on 2 L oxygen.  Chest x-ray negative.  X-ray of left hip/pelvis negative for fracture.  CT of head is negative for acute intracranial abnormality.  CT of C-spine is negative for bony fracture, but showed degenerative disc disease. Pt is placed on progressive bed for observation.   Hospital course from Dr. Jimmye Norman 4/25-4/26/22: Pt presents after multiple falls at home. Pt states her legs are weak and  they just "give out on me." Pt wants to go to SNF. PT/OT have evaluated the pt and recs SNF. CM is working on SNF.    Melinda [name]  KGU:[id number] DOB: 1956-02-02 DOA: 08/22/2020 PCP: Donnie Coffin, MD  Assessment & Plan:   Principal Problem:   Fall at home, initial encounter Active Problems:   Hypertension   Type II diabetes mellitus with renal manifestations ([place])   Gastroesophageal reflux disease   Claudication in peripheral vascular disease ([place])   Hyperlipidemia LDL goal <70   CAD (coronary artery disease), native coronary artery   Atrial fibrillation with RVR ([facility])   CKD (chronic kidney disease), stage IIIa   Depression   Chronic systolic CHF (congestive heart failure) ([place])   Iron deficiency anemia    Fall: at home. CT head negative.  CT of C-spine is negative for acute bony fracture, but showed degenerative disc disease.  X-ray of left hip is negative. PT/OT recs SNF. CM is aware and working on this.   HTN: continue on metoprolol, amlodipine. Will hold lasix secondary to AKI on CKD. IV hydralazine prn   DM2: HbA1c 8.0, poorly controlled. Continue on SSI w/ accuchecks   GERD: continue on pantoprazole   Hx of PVD: w/ hx of claudication. Continue on statin   HLD: continue on statin   Hx of CAD: continue on metoprolol, statin    Likely PAF: w/ RVR. Continue on home dose of metoprolol, amiodarone & eliquis  AKI on CKDIIIa: baseline Cr 1.0-1.2. Cr is trending up from day prior. Will hold home dose of lasix  Depression: severity unknown.  Continue on home dose of duloxetine   Chronic systolic CHF: echo on [id number] showed EF 30 to 35%. Will hold lasix secondary to AKI on CKD. Monitor I/Os.   IDA: continue on iron supplement    Chronic left foot wound: does not seem to be infected. Continue w/ wound care    DVT prophylaxis: eliquis Code Status: full  Family Communication:  Disposition Plan: likely d/c to SNF   Level of care: Progressive Cardiac    Status is: Observation  The patient will require care spanning > 2 midnights and should be moved to inpatient because: Unsafe d/c plan, CM is working on getting the pt to SNF.   Dispo: The patient is from: Home              Anticipated d/c is to: SNF              Patient currently is not medically stable to d/c.   Difficult to place patient: unclear          Consultants:      Procedures:    Antimicrobials:   Subjective: Pt c/o fatigue   Objective: Vitals:   08/23/20 1945 08/24/20 0400 08/24/20 0500 08/24/20 0813  BP: 97/60 118/75  114/79  Pulse: 98 99  (!) 109  Resp: 18   18  Temp: 97.9 F (36.6 C) 98.3 F (36.8 C)  98 F (36.7 C)  TempSrc: Oral Oral  Oral  SpO2: 91% 98%  98%  Weight:   104 kg   Height:        Intake/Output Summary (Last 24 hours) at 08/24/2020 0823 Last data filed at 08/23/2020 1200 Gross per 24 hour  Intake 360 ml  Output --  Net 360 ml   Filed Weights   08/22/20 2315 08/23/20 0500 08/24/20 0500  Weight: 103.7 kg 103.5 kg 104 kg    Examination:  General exam: Appears comfortable   Respiratory system: clear breath sounds b/l Cardiovascular system: S1/S2+. No rubs or clicks  Gastrointestinal system: Abd is soft, obese, NT & normal bowel sounds  Central nervous system: Alert and oriented. Moves all extremities  Psychiatry: Judgement and insight appear normal. Appropriate mood and affect    Data Reviewed: I have personally reviewed following labs and imaging studies  CBC: Recent Labs  Lab 08/18/20 0526 08/19/20 0735 08/22/20 0858 08/23/20 0952 08/24/20 0322  WBC 17.9* 13.4* 10.1 9.4 9.3  NEUTROABS  --  11.0* 7.1  --   --   HGB 14.6 12.8 13.2 13.8 11.5*  HCT 44.4 38.0 39.7 41.6 35.5*  MCV 75.6* 73.1* 73.7* 75.6* 76.3*  PLT 303 323 388 453* 0000000   Basic Metabolic Panel: Recent Labs  Lab 08/18/20 0526 08/19/20 0735 08/22/20 0858 08/23/20 0609 08/23/20 0952 08/24/20 0322  NA 146* 139 137 137  --  135  K 3.6  3.4* 3.9 3.9  --  4.3  CL 106 102 98 99  --  100  CO2 29 27 28 28   --  27  GLUCOSE 196* 235* 164* 190*  --  304*  BUN 17 12 17 14   --  16  CREATININE 0.94 0.93 1.31* 1.06*  --  1.44*  CALCIUM 8.8* 8.2* 8.5* 8.1*  --  8.0*  MG  --   --  1.7  --  1.7  --    GFR: Estimated Creatinine Clearance: 48.5 mL/min (A) (by C-G formula based on SCr of 1.44 mg/dL (H)). Liver Function Tests: Recent Labs  Lab 08/22/20 [phone]  08/24/20 0322  AST 50* 33  ALT 46* 33  ALKPHOS 108 100  BILITOT 1.1 0.6  PROT 6.8 5.5*  ALBUMIN 2.4* 2.0*   No results for input(s): LIPASE, AMYLASE in the last 168 hours. No results for input(s): AMMONIA in the last 168 hours. Coagulation Profile: No results for input(s): INR, PROTIME in the last 168 hours. Cardiac Enzymes: No results for input(s): CKTOTAL, CKMB, CKMBINDEX, TROPONINI in the last 168 hours. BNP (last 3 results) No results for input(s): PROBNP in the last 8760 hours. HbA1C: No results for input(s): HGBA1C in the last 72 hours. CBG: Recent Labs  Lab 08/23/20 0922 08/23/20 1141 08/23/20 1603 08/23/20 2119 08/24/20 0745  GLUCAP 237* 337* 186* 148* 200*   Lipid Profile: No results for input(s): CHOL, HDL, LDLCALC, TRIG, CHOLHDL, LDLDIRECT in the last 72 hours. Thyroid Function Tests: Recent Labs    08/23/20 0952  TSH 2.314   Anemia Panel: No results for input(s): VITAMINB12, FOLATE, FERRITIN, TIBC, IRON, RETICCTPCT in the last 72 hours. Sepsis Labs: Recent Labs  Lab 08/22/20 0850  LATICACIDVEN 1.7    Recent Results (from the past 240 hour(s))  Urine Culture     Status: None   Collection Time: 08/16/20 12:13 PM   Specimen: Urine, Random  Result Value Ref Range Status   Specimen Description   Final    URINE, RANDOM Performed at [facility], [address], [place], [place] [address]    Special Requests   Final    NONE Performed at [facility], [address], [place], [place] [address]    Culture   Final     NO GROWTH Performed at [facility], [place] [address] [place], [place] [address]    Report Status 08/18/2020 FINAL  Final  SARS CORONAVIRUS 2 (TAT 6-24 HRS) Nasopharyngeal Nasopharyngeal Swab     Status: None   Collection Time: 08/22/20 11:55 AM   Specimen: Nasopharyngeal Swab  Result Value Ref Range Status   SARS Coronavirus 2 NEGATIVE NEGATIVE Final    Comment: (NOTE) SARS-CoV-2 target nucleic acids are NOT DETECTED.  The SARS-CoV-2 RNA is generally detectable in upper and lower respiratory specimens during the acute phase of infection. Negative results do not preclude SARS-CoV-2 infection, do not rule out co-infections with other pathogens, and should not be used as the sole basis for treatment or other patient management decisions. Negative results must be combined with clinical observations, patient history, and epidemiological information. The expected result is Negative.  Fact Sheet for Patients: SugarRoll.be  Fact Sheet for Healthcare Providers: https://www.woods-mathews.com/  This test is not yet approved or cleared by the [place] FDA and  has been authorized for detection and/or diagnosis of SARS-CoV-2 by FDA under an Emergency Use Authorization (EUA). This EUA will remain  in effect (meaning this test can be used) for the duration of the COVID-19 declaration under Se ction 564(b)(1) of the Act, 21 U.S.C. section 360bbb-3(b)(1), unless the authorization is terminated or revoked sooner.  Performed at [facility], [place] [address], [place],  [address]          Radiology Studies: CT Head Wo Contrast  Result Date: 08/22/2020 CLINICAL DATA:  Multiple falls. On anticoagulation. Head trauma. Left-sided neck pain. EXAM: CT HEAD WITHOUT CONTRAST CT CERVICAL SPINE WITHOUT CONTRAST TECHNIQUE: Multidetector CT imaging of the head and cervical spine was performed following the standard protocol without  intravenous contrast. Multiplanar CT image reconstructions of the cervical spine were also generated. COMPARISON:  08/11/2020, 08/16/2020 FINDINGS: CT HEAD FINDINGS Brain: No evidence of acute infarction, hemorrhage, hydrocephalus, extra-axial collection or mass lesion/mass effect. Scattered low-density changes within the periventricular and subcortical white matter compatible with chronic microvascular ischemic change. Vascular: Atherosclerotic calcifications involving the large vessels of the skull base. No unexpected hyperdense vessel. Skull: Normal. Negative for fracture or focal lesion. Sinuses/Orbits: No acute finding. Other: Negative for scalp hematoma CT CERVICAL SPINE FINDINGS Alignment: Facet joints are aligned without dislocation or traumatic listhesis. Dens and lateral masses are aligned. Skull base and vertebrae: No acute fracture. No primary bone lesion or focal pathologic process. Soft tissues and spinal canal: No prevertebral fluid or swelling. No visible canal hematoma. Disc levels: Moderate multilevel cervical spondylosis, which appears most pronounced at the C3-4 through C5-6 levels. No progression from prior. Upper chest: Included lung apices are clear Other: Degenerative changes of the bilateral sternoclavicular joints. Bilateral carotid atherosclerosis. IMPRESSION: 1. No acute intracranial findings. 2. No evidence of acute fracture or traumatic listhesis of the cervical spine. 3. Moderate multilevel cervical spondylosis, most pronounced at the C3-4 through C5-6 levels. Electronically Signed   By: Davina Poke D.O.   On: 08/22/2020 09:20   CT Cervical Spine Wo Contrast  Result Date: 08/22/2020 CLINICAL DATA:  Multiple falls. On anticoagulation. Head trauma. Left-sided neck pain. EXAM: CT HEAD WITHOUT CONTRAST CT CERVICAL SPINE WITHOUT CONTRAST TECHNIQUE: Multidetector CT imaging of the head and cervical spine was performed following the standard protocol without intravenous contrast.  Multiplanar CT image reconstructions of the cervical spine were also generated. COMPARISON:  08/11/2020, 08/16/2020 FINDINGS: CT HEAD FINDINGS Brain: No evidence of acute infarction, hemorrhage, hydrocephalus, extra-axial collection or mass lesion/mass effect. Scattered low-density changes within the periventricular and subcortical white matter compatible with chronic microvascular ischemic change. Vascular: Atherosclerotic calcifications involving the large vessels of the skull base. No unexpected hyperdense vessel. Skull: Normal. Negative for fracture or focal lesion. Sinuses/Orbits: No acute finding. Other: Negative for scalp hematoma CT CERVICAL SPINE FINDINGS Alignment: Facet joints are aligned without dislocation or traumatic listhesis. Dens and lateral masses are aligned. Skull base and vertebrae: No acute fracture. No primary bone lesion or focal pathologic process. Soft tissues and spinal canal: No prevertebral fluid or swelling. No visible canal hematoma. Disc levels: Moderate multilevel cervical spondylosis, which appears most pronounced at the C3-4 through C5-6 levels. No progression from prior. Upper chest: Included lung apices are clear Other: Degenerative changes of the bilateral sternoclavicular joints. Bilateral carotid atherosclerosis. IMPRESSION: 1. No acute intracranial findings. 2. No evidence of acute fracture or traumatic listhesis of the cervical spine. 3. Moderate multilevel cervical spondylosis, most pronounced at the C3-4 through C5-6 levels. Electronically Signed   By: Davina Poke D.O.   On: 08/22/2020 09:20   DG Chest Portable 1 View  Result Date: 08/22/2020 CLINICAL DATA:  Recurrent falls. Coronary artery disease. Left hip pain. EXAM: PORTABLE CHEST 1 VIEW COMPARISON:  08/15/2020 FINDINGS: Heart size is within normal limits. Prior CABG again noted. No evidence of pulmonary infiltrate or edema. No evidence of pleural effusion. IMPRESSION: No active disease. Electronically Signed    By: Marlaine Hind M.D.   On: 08/22/2020 09:48   DG Hip Unilat W or Wo Pelvis 2-3 Views Left  Result Date: 08/22/2020 CLINICAL DATA:  Recurrent fall.  Left hip pain. EXAM: DG HIP (WITH OR WITHOUT PELVIS) 2-3V LEFT COMPARISON:  None. FINDINGS: Stable chronic deformity of the right hip. No signs of acute left hip fracture or dislocation. Bilateral hip osteoarthritis. Extensive vascular calcifications  noted. IMPRESSION: 1. No acute findings. 2. Chronic deformity of the right hip. Electronically Signed   By: Kerby Moors M.D.   On: 08/22/2020 09:46        Scheduled Meds: . amiodarone  200 mg Oral BID  . amLODipine  5 mg Oral Daily  . apixaban  5 mg Oral BID  . atorvastatin  80 mg Oral q1800  . cholecalciferol  2,000 Units Oral Daily  . docusate sodium  100 mg Oral BID  . DULoxetine  60 mg Oral Daily  . ferrous sulfate  325 mg Oral Daily  . furosemide  40 mg Oral Daily  . hydrocerin   Topical BID  . insulin aspart  0-5 Units Subcutaneous QHS  . insulin aspart  0-9 Units Subcutaneous TID WC  . insulin aspart  3 Units Subcutaneous TID WC  . metoprolol tartrate  50 mg Oral BID  . multivitamin with minerals  2 tablet Oral Daily  . oxybutynin  5 mg Oral BID  . pantoprazole  40 mg Oral Daily  . pregabalin  150 mg Oral TID  . sodium chloride flush  10 mL Intravenous Q12H  . tiZANidine  4 mg Oral TID  . varenicline  1 mg Oral Daily   Continuous Infusions:   LOS: 0 days    Time spent: 30 mins     Wyvonnia Dusky, MD [facility] Pager [phone]  If 7PM-7AM, please contact night-coverage 08/24/2020, 8:23 AM

## 2020-08-24 NOTE — Progress Notes (Signed)
Inpatient Diabetes Program Recommendations  AACE/ADA: New Consensus Statement on Inpatient Glycemic Control  Target Ranges:  Prepandial:   less than 140 mg/dL      Peak postprandial:   less than 180 mg/dL (1-2 hours)      Critically ill patients:  140 - 180 mg/dL   Results for Melinda [name], Melinda [name] [facility] (MRN [id number]) as of 08/24/2020 08:04  Ref. Range 08/23/2020 09:22 08/23/2020 11:41 08/23/2020 16:03 08/23/2020 21:19 08/24/2020 07:45  Glucose-Capillary Latest Ref Range: 70 - 99 mg/dL 237 (H) 337 (H) 186 (H) 148 (H) 200 (H)   Review of Glycemic Control  Diabetes history: DM2 Outpatient Diabetes medications: Novolog 0-20 units Q4H, Ozempic 0.5 mg Qweek Current orders for Inpatient glycemic control: Novolog 0-9 units TID with meals, Novolog 0-5 units QHS  Inpatient Diabetes Program Recommendations:    Insulin: Please consider ordering Novolog 3 units TID with meals for meal coverage if patient eats at least 50% of meals.  Thanks, Barnie Alderman, RN, MSN, CDE Diabetes Coordinator Inpatient Diabetes Program [phone] (Team Pager from 8am to 5pm)

## 2020-08-24 NOTE — Progress Notes (Signed)
Occupational Therapy Treatment Patient Details Name: Melinda [name] MRN: [id number] DOB: 06-30-1955 Today's Date: 08/24/2020    History of present illness Pt admitted for falls at home. Recent admission from 4/13-4/21 secondary to sepsis. HIstory includes HTN, HLD, DM, GERD, depression, anxiety, CAD, MI, and afib.   OT comments  Pt seen for OT tx this date. Pt's son present for session. Pt denies BLE weakness or dizziness, agreeable to session. Vitals taken in sitting and standing. Not orthostatic this date (sitting BP 90/53, standing 100min BP 93/64, standing 69min BP 99/51) and on 2nd attempt pt able to tolerate standing for 3 minutes. Pt/family educated further in positional changes and impact on BP, educated in falls prevention strategies for ADL and ADL mobility as well as home/routines modifications to maximize safety during ADL. Pt/son verbalized understanding. Pt continues to benefit from skilled OT Services. Continue to recommend short term rehab at SNF to maximize pt's return to PLOF and eventual safe return home.    Follow Up Recommendations  SNF    Equipment Recommendations  3 in 1 bedside commode    Recommendations for Other Services      Precautions / Restrictions Precautions Precautions: Fall Precaution Comments: monitor orthostatics Restrictions Weight Bearing Restrictions: No       Mobility Bed Mobility               General bed mobility comments: in recliner before and after    Transfers Overall transfer level: Needs assistance Equipment used: Rolling walker (2 wheeled) Transfers: Sit to/from Stand Sit to Stand: Min guard              Balance Overall balance assessment: Needs assistance Sitting-balance support: Feet supported;No upper extremity supported Sitting balance-Leahy Scale: Good     Standing balance support: Bilateral upper extremity supported;Single extremity supported Standing balance-Leahy Scale: Fair                              ADL either performed or assessed with clinical judgement   ADL Overall ADL's : Needs assistance/impaired                     Lower Body Dressing: Sitting/lateral leans;Set up;Supervision/safety Lower Body Dressing Details (indicate cue type and reason): increased time/effort but able to don socks seated in recliner without assist Toilet Transfer: RW;Ambulation;BSC;Min guard                   Vision Patient Visual Report: No change from baseline     Perception     Praxis      Cognition Arousal/Alertness: Awake/alert Behavior During Therapy: WFL for tasks assessed/performed Overall Cognitive Status: Within Functional Limits for tasks assessed                                          Exercises Other Exercises  Other Exercises: Pt/family educated further in positional changes and impact on BP, educated in falls prevention strategies for ADL and ADL mobility   Shoulder Instructions       General Comments orthostatic vitals taken, not orthostatic this date but pt does endorse mild "weakness in BLE" with initial standing but then improves with standing, able to tolerate 46min standing today    Pertinent Vitals/ Pain       Pain Assessment: No/denies pain  Home Living  Prior Functioning/Environment              Frequency  Min 2X/week        Progress Toward Goals  OT Goals(current goals can now be found in the care plan section)  Progress towards OT goals: Progressing toward goals  Acute Rehab OT Goals Patient Stated Goal: to go to rehab OT Goal Formulation: With patient/family Time For Goal Achievement: 09/06/20 Potential to Achieve Goals: Good  Plan Discharge plan remains appropriate;Frequency remains appropriate    Co-evaluation                 AM-PAC OT "6 Clicks" Daily Activity     Outcome Measure   Help from another person eating meals?:  None Help from another person taking care of personal grooming?: A Little Help from another person toileting, which includes using toliet, bedpan, or urinal?: A Little Help from another person bathing (including washing, rinsing, drying)?: A Little Help from another person to put on and taking off regular upper body clothing?: A Little Help from another person to put on and taking off regular lower body clothing?: A Little 6 Click Score: 19    End of Session    OT Visit Diagnosis: Unsteadiness on feet (R26.81);Other abnormalities of gait and mobility (R26.89);Repeated falls (R29.6);Muscle weakness (generalized) (M62.81)   Activity Tolerance Patient tolerated treatment well   Patient Left in chair;with call bell/phone within reach;with chair alarm set;with family/visitor present   Nurse Communication Other (comment) (BPs)        Time: 7262-0355 OT Time Calculation (min): 28 min  Charges: OT General Charges $OT Visit: 1 Visit OT Treatments $Self Care/Home Management : 8-22 mins $Therapeutic Activity: 8-22 mins  Hanley Hays, MPH, MS, OTR/L ascom [phone] 08/24/20, 4:11 PM

## 2020-08-25 DIAGNOSIS — W19XXXS Unspecified fall, sequela: Secondary | ICD-10-CM

## 2020-08-25 DIAGNOSIS — E1129 Type 2 diabetes mellitus with other diabetic kidney complication: Secondary | ICD-10-CM | POA: Diagnosis not present

## 2020-08-25 DIAGNOSIS — I10 Essential (primary) hypertension: Secondary | ICD-10-CM | POA: Diagnosis not present

## 2020-08-25 DIAGNOSIS — I480 Paroxysmal atrial fibrillation: Secondary | ICD-10-CM

## 2020-08-25 DIAGNOSIS — K219 Gastro-esophageal reflux disease without esophagitis: Secondary | ICD-10-CM | POA: Diagnosis not present

## 2020-08-25 LAB — GLUCOSE, CAPILLARY
Glucose-Capillary: 194 mg/dL — ABNORMAL HIGH (ref 70–99)
Glucose-Capillary: 208 mg/dL — ABNORMAL HIGH (ref 70–99)
Glucose-Capillary: 126 mg/dL — ABNORMAL HIGH (ref 70–99)

## 2020-08-25 LAB — BASIC METABOLIC PANEL
Anion gap: 9 (ref 5–15)
BUN: 21 mg/dL (ref 8–23)
CO2: 26 mmol/L (ref 22–32)
Calcium: 8.4 mg/dL — ABNORMAL LOW (ref 8.9–10.3)
Chloride: 103 mmol/L (ref 98–111)
Creatinine, Ser: 1.43 mg/dL — ABNORMAL HIGH (ref 0.44–1.00)
GFR, Estimated: 40 mL/min — ABNORMAL LOW (ref >60)
Glucose, Bld: 208 mg/dL — ABNORMAL HIGH (ref 70–99)
Potassium: 4 mmol/L (ref 3.5–5.1)
Sodium: 138 mmol/L (ref 135–145)

## 2020-08-25 LAB — CBC
HCT: 35.1 % — ABNORMAL LOW (ref 36.0–46.0)
Hemoglobin: 12 g/dL (ref 12.0–15.0)
MCH: 25.4 pg — ABNORMAL LOW (ref 26.0–34.0)
MCHC: 34.2 g/dL (ref 30.0–36.0)
MCV: 74.4 fL — ABNORMAL LOW (ref 80.0–100.0)
Platelets: 408 10*3/uL — ABNORMAL HIGH (ref 150–400)
RBC: 4.72 MIL/uL (ref 3.87–5.11)
RDW: 22.4 % — ABNORMAL HIGH (ref 11.5–15.5)
WBC: 8.4 10*3/uL (ref 4.0–10.5)
nRBC: 0 % (ref 0.0–0.2)

## 2020-08-25 LAB — RESP PANEL BY RT-PCR (FLU A&B, COVID) ARPGX2
Influenza A by PCR: NEGATIVE
Influenza B by PCR: NEGATIVE
SARS Coronavirus 2 by RT PCR: NEGATIVE

## 2020-08-25 MED ORDER — INSULIN ASPART 100 UNIT/ML ~~LOC~~ SOLN: 4.0000 [IU] | Freq: Three times a day (TID) | SUBCUTANEOUS | 11 refills | Status: AC

## 2020-08-25 MED ORDER — INSULIN GLARGINE 100 UNIT/ML ~~LOC~~ SOLN: 6.0000 [IU] | Freq: Every day | SUBCUTANEOUS | 0 refills | Status: DC

## 2020-08-25 MED ORDER — FUROSEMIDE 20 MG PO TABS: 40.0000 mg | ORAL_TABLET | Freq: Every day | ORAL | 1 refills | Status: DC | PRN

## 2020-08-25 NOTE — NC FL2 (Signed)
[place] LEVEL OF CARE SCREENING TOOL     IDENTIFICATION  Patient Name: Neasia Fleeman Birthdate: 08/25/1955 Sex: female Admission Date (Current Location): 08/22/2020  [place] and [place] Number:  Engineering geologist and Address:  [facility], [address], [place], [place] [address]      Provider Number: [id number]  Attending Physician Name and Address:  Loletha Grayer, MD  Relative Name and Phone Number:       Current Level of Care: Hospital Recommended Level of Care: [place] Prior Approval Number:    Date Approved/Denied:   PASRR Number: [id number]  Discharge Plan: SNF    Current Diagnoses: Patient Active Problem List   Diagnosis Date Noted  . Fall at home, initial encounter 08/22/2020  . Atrial fibrillation with RVR ([place]) 08/22/2020  . CKD (chronic kidney disease), stage IIIa 08/22/2020  . Depression 08/22/2020  . Chronic systolic CHF (congestive heart failure) ([place]) 08/22/2020  . Iron deficiency anemia 08/22/2020  . Atrial flutter with rapid ventricular response ([place])   . Acute respiratory failure with hypoxia ([place]) 08/11/2020  . S/P gastric bypass 02/09/2020  . Severe recurrent major depression without psychotic features ([place]) 03/12/2018  . Cocaine abuse ([place]) 03/12/2018  . Suicidal ideation 03/12/2018  . Atherosclerosis of native arteries of the extremities with ulceration ([place]) 06/19/2017  . Gangrene of toe of left foot ([place]) 05/14/2017  . CAD (coronary artery disease), native coronary artery 05/05/2017  . Claudication in peripheral vascular disease ([place]) 10/18/2016  . Hyperlipidemia LDL goal <70 10/18/2016  . Onychomycosis 10/10/2016  . Encounters for administrative purpose 10/10/2016  . Amputated toe, left ([place]) 10/10/2016  . Amputated toe, right ([place]) 10/10/2016  . Osteomyelitis of left foot ([place]) 10/21/2015  . Hyperglycemia 10/02/2015  . Gastroesophageal reflux disease 11/16/2014  .  Generalized ischemic myocardial dysfunction 07/07/2014  . Peripheral neuropathy   . Tobacco abuse   . Obesity   . 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 Chronic coronary artery disease 06/08/2014  . Type II diabetes mellitus with renal manifestations ([place])   . NSTEMI (non-ST elevated myocardial infarction) ([place]) 06/03/2014  . Hypertension 09/11/2012  . Diabetes mellitus type 2, insulin dependent ([place]) 09/11/2012    Orientation RESPIRATION BLADDER Height & Weight     Self,Time,Situation,Place  Normal Continent Weight: 230 lb 3.2 oz (104.4 kg) Height:  5\' 8"  (172.7 cm)  BEHAVIORAL SYMPTOMS/MOOD NEUROLOGICAL BOWEL NUTRITION STATUS      Continent Diet (DYS 3 diet, thin liquids)  AMBULATORY STATUS COMMUNICATION OF NEEDS Skin   Limited Assist Verbally Skin abrasions (open wound on left toe, guaze dressing, change daily)                       Personal Care Assistance Level of Assistance  Bathing,Feeding,Dressing Bathing Assistance: Limited assistance Feeding assistance: Independent Dressing Assistance: Limited assistance     Functional Limitations Info  Sight,Speech,Hearing Sight Info: Adequate Hearing Info: Adequate Speech Info: Adequate    SPECIAL CARE FACTORS FREQUENCY  PT (By licensed PT),OT (By licensed OT)     PT Frequency: 5x OT Frequency: 5x            Contractures Contractures Info: Not present    Additional Factors Info  Code Status,Allergies Code Status Info: full code Allergies Info: no known allergies           Current Medications (08/25/2020):  This is the current hospital active medication list Current Facility-Administered Medications  Medication Dose Route  Frequency Provider Last Rate Last Admin  . acetaminophen (TYLENOL) tablet 650 mg  650 mg Oral Q6H PRN Ivor Costa, MD      . amiodarone (PACERONE) tablet 200 mg  200 mg Oral BID Ivor Costa, MD   200 mg at 08/25/20 0917  . amLODipine (NORVASC) tablet 5 mg  5 mg Oral Daily Ivor Costa, MD   5 mg at  08/25/20 0254  . apixaban (ELIQUIS) tablet 5 mg  5 mg Oral BID Ivor Costa, MD   5 mg at 08/25/20 2706  . atorvastatin (LIPITOR) tablet 80 mg  80 mg Oral q1800 Ivor Costa, MD   80 mg at 08/24/20 1701  . cholecalciferol (VITAMIN D3) tablet 2,000 Units  2,000 Units Oral Daily Ivor Costa, MD   2,000 Units at 08/25/20 [phone]  . docusate sodium (COLACE) capsule 100 mg  100 mg Oral BID Ivor Costa, MD   100 mg at 08/25/20 0917  . DULoxetine (CYMBALTA) DR capsule 60 mg  60 mg Oral Daily Ivor Costa, MD   60 mg at 08/25/20 0917  . ferrous sulfate tablet 325 mg  325 mg Oral Daily Ivor Costa, MD   325 mg at 08/25/20 2831  . hydrALAZINE (APRESOLINE) injection 5 mg  5 mg Intravenous Q2H PRN Ivor Costa, MD      . hydrocerin (EUCERIN) cream   Topical BID Renda Rolls, [facility]   Given at 08/23/20 2239  . insulin aspart (novoLOG) injection 0-5 Units  0-5 Units Subcutaneous QHS Ivor Costa, MD   2 Units at 08/22/20 2217  . insulin aspart (novoLOG) injection 0-9 Units  0-9 Units Subcutaneous TID WC Ivor Costa, MD   2 Units at 08/25/20 0919  . insulin aspart (novoLOG) injection 3 Units  3 Units Subcutaneous TID WC Wyvonnia Dusky, MD   3 Units at 08/25/20 0919  . lidocaine (LIDODERM) 5 % 1 patch  1 patch Transdermal Daily PRN Ivor Costa, MD      . metoprolol tartrate (LOPRESSOR) tablet 50 mg  50 mg Oral BID Ivor Costa, MD   50 mg at 08/25/20 5176  . multivitamin with minerals tablet 2 tablet  2 tablet Oral Daily Ivor Costa, MD   2 tablet at 08/25/20 [phone]  . oxybutynin (DITROPAN) tablet 5 mg  5 mg Oral BID Ivor Costa, MD   5 mg at 08/25/20 3710  . oxyCODONE-acetaminophen (PERCOCET/ROXICET) 5-325 MG per tablet 1 tablet  1 tablet Oral Q4H PRN Ivor Costa, MD      . pantoprazole (PROTONIX) EC tablet 40 mg  40 mg Oral Daily Ivor Costa, MD   40 mg at 08/25/20 6269  . pregabalin (LYRICA) capsule 150 mg  150 mg Oral TID Ivor Costa, MD   150 mg at 08/25/20 0917  . sodium chloride flush (NS) 0.9 % injection 10 mL  10 mL  Intravenous Q12H Ivor Costa, MD   10 mL at 08/25/20 0919  . tiZANidine (ZANAFLEX) tablet 4 mg  4 mg Oral TID Ivor Costa, MD   4 mg at 08/25/20 4854  . varenicline (CHANTIX) tablet 1 mg  1 mg Oral Daily Ivor Costa, MD         Discharge Medications: Please see discharge summary for a list of discharge medications.  Relevant Imaging Results:  Relevant Lab Results:   Additional Information [id number]  Eileen Stanford, LCSW

## 2020-08-25 NOTE — TOC Progression Note (Signed)
Transition of Care [facility]) - Progression Note    Patient Details  Name: Melinda [name] MRN: [id number] Date of Birth: 1956/03/13  Transition of Care [facility]) CM/SW Contact  Eileen Stanford, LCSW Phone Number: 08/25/2020, 12:20 PM  Clinical Narrative:   Pt notified of bed offer at [facility] and has accepted it. Insurance has been started.    Expected Discharge Plan: [place] Barriers to Discharge: No SNF bed  Expected Discharge Plan and Services Expected Discharge Plan: [place] In-house Referral: NA   Post Acute Care Choice: [place] Living arrangements for the past 2 months: Single Family Home                                       Social Determinants of Health (SDOH) Interventions    Readmission Risk Interventions No flowsheet data found.

## 2020-08-25 NOTE — Progress Notes (Addendum)
Mobility Specialist - Progress Note   08/25/20 1400  Mobility  Activity Ambulated in hall  Level of Assistance Minimal assist, patient does 75% or more  Assistive Device Front wheel walker  Distance Ambulated (ft) 20 ft  Mobility Response Tolerated well  Mobility performed by Mobility specialist  $Mobility charge 1 Mobility    Pt ambulated in room without AD. 2-hand assist + furniture surfing. No LOB but very unsteady with anterior lean. MinA for steadying with RW. Voiced weakness in LE. Denied dizziness/SOB.    Kathee Delton Mobility Specialist 08/25/20, 2:14 PM

## 2020-08-25 NOTE — Progress Notes (Addendum)
Pt being discharged to [facility]. IV and tele removed. Report called to Cassandra, LPN at the facility. Pt and family aware of discharge. EMS to  transport to facility.

## 2020-08-25 NOTE — Discharge Summary (Signed)
Melinda [name] at [place] NAME: Melinda [name]    MR#:  [id number]  DATE OF BIRTH:  [birth year]  DATE OF ADMISSION:  08/22/2020 ADMITTING PHYSICIAN: Ivor Costa, MD  DATE OF DISCHARGE: 08/25/20  PRIMARY CARE PHYSICIAN: Donnie Coffin, MD    ADMISSION DIAGNOSIS:  Atrial flutter with rapid ventricular response (HCC) [I48.92] Atrial fibrillation with RVR ([place]) [I48.91] Recurrent falls [R29.6]  DISCHARGE DIAGNOSIS:  Principal Problem:   Fall at home, initial encounter Active Problems:   Hypertension   Type II diabetes mellitus with renal manifestations (HCC)   Gastroesophageal reflux disease   Claudication in peripheral vascular disease (HCC)   Hyperlipidemia LDL goal <70   CAD (coronary artery disease), native coronary artery   Atrial fibrillation with RVR (HCC)   CKD (chronic kidney disease), stage IIIa   Depression   Chronic systolic CHF (congestive heart failure) (HCC)   Iron deficiency anemia   SECONDARY DIAGNOSIS:   Past Medical History:  Diagnosis Date  . Anxiety   . Atrial fibrillation ([place])   . Atrial flutter ([place])   . Coronary artery disease    a. 06/2014 NSTEMI s/p CABG x 4 (LIMA to LAD, VG to Diag, VG to OM, VG to PDA).  . Depression   . Diabetic neuropathy ([place])   . GERD (gastroesophageal reflux disease)   . HTN (hypertension)   . Hyperlipidemia LDL goal <70   . IDDM (insulin dependent diabetes mellitus)   . Ischemic cardiomyopathy    a. 06/2014 Echo: EF 40-45%, HK of entire inferolateral and inferior myocardium c/w infarct of RCA/LCx, GR2DD, mild MR  . Myocardial infarction ([place]) 2016  . OA (osteoarthritis) of knee   . Obesity   . Osteoarthritis   . Osteomyelitis ([place])   . Peripheral neuropathy   . Peripheral vascular disease ([place])    a. 09/2016 Periph Angio: CTO R popliteal, CTO L prox/mid SFA->Med Rx; b. 05/2017 PTA: LSFA (Viabahn covered stent x 2), DEB to L Post Tibial; c. 06/2017 ABI: R 0.44, L 1.05.  . Tobacco abuse      HOSPITAL COURSE:   1.  Fall at home.  CT scan of the head negative.  CT scan of the cervical spine negative for acute bony fracture but did show degenerative disc disease.  Physical therapy recommended rehab.  Obtained authorization for rehab today. 2.  Essential hypertension on metoprolol.  Hold Norvasc with blood pressure being a little bit on the lower side. 3.  Type 2 diabetes mellitus with hyperglycemia and hyper lipidemia.  Continue Lipitor.  Patient on Ozempic and sliding scale at home.  Rehab facility does not have Ozempic and will give low-dose Lantus instead while at the rehab.  Can go back on Ozempic and discontinue Lantus once discharged from the rehab. 4.  GERD.  Continue Protonix 5.  History of peripheral vascular disease with claudication on Lipitor and Eliquis 6.  Paroxysmal atrial fibrillation on metoprolol, amiodarone and Eliquis.  Patient had elevated heart rate on presentation. 7.  Acute kidney injury on chronic kidney disease stage II.  Creatinine 1.31 on presentation and 1.43 upon discharge to rehab.  Lasix was initially given and then held.  We will have as needed Lasix at this point.  Baseline creatinine around 0.93.  Recheck BMP 1 week. 8.  Chronic systolic congestive heart failure holding Lasix with elevated creatinine and no signs of heart failure.  Patient on metoprolol.  Can consider ACE inhibitor and spironolactone as outpatient when creatinine  improves.  Follow-up cardiology and CHF clinic as outpatient. 9.  Depression on Cymbalta 10.  Chronic left foot wound.  Outpatient follow-up with wound care center.  Wash and dry feet daily and cover lesions with Xeroform gauze topped with dry gauze and secured with Kerlix roll gauze and paper tape.  Feet can be in Prevalon boots while in bed.  DISCHARGE CONDITIONS:   Satisfactory  CONSULTS OBTAINED:  None  DRUG ALLERGIES:  No Known Allergies  DISCHARGE MEDICATIONS:   Allergies as of 08/25/2020   No Known  Allergies     Medication List    STOP taking these medications   amLODipine 5 MG tablet Commonly known as: NORVASC   amoxicillin-clavulanate 875-125 MG tablet Commonly known as: Augmentin   clobetasol 0.05 % external solution Commonly known as: TEMOVATE   Ozempic (0.25 or 0.5 MG/DOSE) 2 MG/1.5ML Sopn Generic drug: Semaglutide(0.25 or 0.5MG /DOS)     TAKE these medications   Accu-Chek Aviva Plus test strip Generic drug: glucose blood 1 each 3 (three) times daily.   Accu-Chek Softclix Lancets lancets 1 each 3 (three) times daily.   amiodarone 200 MG tablet Commonly known as: PACERONE TAKE 1 TABLET TWICE DAILY   apixaban 5 MG Tabs tablet Commonly known as: ELIQUIS Take 1 tablet (5 mg total) by mouth 2 (two) times daily.   atorvastatin 80 MG tablet Commonly known as: LIPITOR Take 1 tablet (80 mg total) by mouth daily at 6 PM.   B-D SINGLE USE SWABS REGULAR Pads Apply topically.   CERAVE EX Apply 1 application topically in the morning and at bedtime.   clotrimazole 1 % cream Commonly known as: LOTRIMIN Apply  as directed to affected area twice a day  for fungal infection   docusate sodium 100 MG capsule Commonly known as: COLACE Take 100 mg by mouth 2 (two) times daily.   Droplet Pen Needles 31G X 6 MM Misc Generic drug: Insulin Pen Needle   DULoxetine 60 MG capsule Commonly known as: CYMBALTA Take 60 mg by mouth daily.   Eflornithine HCl 13.9 % cream Apply to upper lip, chin and neck twice a day. What changed:   how much to take  how to take this  when to take this   ferrous sulfate 325 (65 FE) MG tablet Take 325 mg by mouth daily.   furosemide 20 MG tablet Commonly known as: LASIX Take 2 tablets (40 mg total) by mouth daily as needed (fluid retention.). What changed: when to take this   insulin aspart 100 UNIT/ML injection Commonly known as: novoLOG Inject 4 Units into the skin 3 (three) times daily with meals. What changed:   how much to  take  when to take this   insulin glargine 100 UNIT/ML injection Commonly known as: Lantus Inject 0.06 mLs (6 Units total) into the skin at bedtime.   lidocaine 5 % Commonly known as: LIDODERM Place 1 patch onto the skin daily as needed (PAIN.).   Lyrica 150 MG capsule Generic drug: pregabalin Take 1 capsule by mouth three times a day  for nerve pain   metoprolol tartrate 50 MG tablet Commonly known as: LOPRESSOR Take 1 tablet (50 mg total) by mouth 2 (two) times daily.   multivitamin with minerals tablet Take 2 tablets by mouth in the morning and at bedtime.   oxybutynin 5 MG tablet Commonly known as: DITROPAN Take 5 mg by mouth 2 (two) times daily.   pantoprazole 40 MG tablet Commonly known as: PROTONIX Take 1 tablet (  40 mg total) by mouth daily.   potassium chloride SA 20 MEQ tablet Commonly known as: KLOR-CON TAKE 1 TABLET (20 MEQ TOTAL) BY MOUTH DAILY AS NEEDED. What changed:   how much to take  how to take this  when to take this  reasons to take this  additional instructions   tiZANidine 4 MG tablet Commonly known as: ZANAFLEX Take 4 mg by mouth 3 (three) times daily.   varenicline 1 MG tablet Commonly known as: CHANTIX Take 1 mg by mouth daily.   Vitamin D3 50 MCG (2000 UT) Tabs Take 2,000 Units by mouth daily.        DISCHARGE INSTRUCTIONS:   Follow-up team at rehab 1 day  If you experience worsening of your admission symptoms, develop shortness of breath, life threatening emergency, suicidal or homicidal thoughts you must seek medical attention immediately by calling 911 or calling your MD immediately  if symptoms less severe.  You Must read complete instructions/literature along with all the possible adverse reactions/side effects for all the Medicines you take and that have been prescribed to you. Take any new Medicines after you have completely understood and accept all the possible adverse reactions/side effects.   Please note  You  were cared for by a hospitalist during your hospital stay. If you have any questions about your discharge medications or the care you received while you were in the hospital after you are discharged, you can call the unit and asked to speak with the hospitalist on call if the hospitalist that took care of you is not available. Once you are discharged, your primary care physician will handle any further medical issues. Please note that NO REFILLS for any discharge medications will be authorized once you are discharged, as it is imperative that you return to your primary care physician (or establish a relationship with a primary care physician if you do not have one) for your aftercare needs so that they can reassess your need for medications and monitor your lab values.    Today   CHIEF COMPLAINT:   Chief Complaint  Patient presents with  . Fall    HISTORY OF PRESENT ILLNESS:  Melinda [name]  is a 65 y.o. female coming in with falls.   VITAL SIGNS:  Blood pressure 102/70, pulse 95, temperature 98 F (36.7 C), temperature source Oral, resp. rate 18, height 5\' 8"  (1.727 m), weight 104.4 kg, SpO2 94 %.  I/O:    Intake/Output Summary (Last 24 hours) at 08/25/2020 1321 Last data filed at 08/25/2020 0956 Gross per 24 hour  Intake 480 ml  Output --  Net 480 ml    PHYSICAL EXAMINATION:  GENERAL:  65 y.o.-year-old patient lying in the bed with no acute distress.  EYES: Pupils equal, round, reactive to light and accommodation. No scleral icterus.  HEENT: Head atraumatic, normocephalic. Oropharynx and nasopharynx clear.   LUNGS: Normal breath sounds bilaterally, no wheezing, rales,rhonchi or crepitation. No use of accessory muscles of respiration.  CARDIOVASCULAR: S1, S2 normal. No murmurs, rubs, or gallops.  ABDOMEN: Soft, non-tender, non-distended.  EXTREMITIES: No pedal edema.  NEUROLOGIC: Cranial nerves II through XII are intact. Muscle strength 5/5 in all extremities. Sensation intact.  Gait not checked.  PSYCHIATRIC: The patient is alert and oriented x 3.  SKIN: No obvious rash, lesion, or ulcer.   DATA REVIEW:   CBC Recent Labs  Lab 08/25/20 0623  WBC 8.4  HGB 12.0  HCT 35.1*  PLT 408*    Chemistries  Recent Labs  Lab 08/23/20 0952 08/24/20 0322 08/25/20 0623  NA  --  135 138  K  --  4.3 4.0  CL  --  100 103  CO2  --  27 26  GLUCOSE  --  304* 208*  BUN  --  16 21  CREATININE  --  1.44* 1.43*  CALCIUM  --  8.0* 8.4*  MG 1.7  --   --   AST  --  33  --   ALT  --  33  --   ALKPHOS  --  100  --   BILITOT  --  0.6  --     Microbiology Results  Results for orders placed or performed during the hospital encounter of 08/22/20  SARS CORONAVIRUS 2 (TAT 6-24 HRS) Nasopharyngeal Nasopharyngeal Swab     Status: None   Collection Time: 08/22/20 11:55 AM   Specimen: Nasopharyngeal Swab  Result Value Ref Range Status   SARS Coronavirus 2 NEGATIVE NEGATIVE Final    Comment: (NOTE) SARS-CoV-2 target nucleic acids are NOT DETECTED.  The SARS-CoV-2 RNA is generally detectable in upper and lower respiratory specimens during the acute phase of infection. Negative results do not preclude SARS-CoV-2 infection, do not rule out co-infections with other pathogens, and should not be used as the sole basis for treatment or other patient management decisions. Negative results must be combined with clinical observations, patient history, and epidemiological information. The expected result is Negative.  Fact Sheet for Patients: SugarRoll.be  Fact Sheet for Healthcare Providers: https://www.woods-mathews.com/  This test is not yet approved or cleared by the [place] FDA and  has been authorized for detection and/or diagnosis of SARS-CoV-2 by FDA under an Emergency Use Authorization (EUA). This EUA will remain  in effect (meaning this test can be used) for the duration of the COVID-19 declaration under Se ction 564(b)(1)  of the Act, 21 U.S.C. section 360bbb-3(b)(1), unless the authorization is terminated or revoked sooner.  Performed at [facility], [place] [address] [place], [place] [address]       Management plans discussed with the patient, and she is in agreement.  Left message for sister on the phone.  CODE STATUS:     Code Status Orders  (From admission, onward)         Start     Ordered   08/22/20 2319  Full code  Continuous        08/22/20 2318        Code Status History    Date Active Date Inactive Code Status Order ID Comments User Context   08/11/2020 1755 08/19/2020 1537 Full Code [id number]  Kayleen Memos, DO ED   02/09/2020 1516 02/11/2020 1347 Full Code [id number]  Johnathan Hausen, MD Inpatient   12/23/2018 1254 12/23/2018 1648 Full Code [id number]  Algernon Huxley, MD Inpatient   12/16/2018 1245 12/16/2018 1722 Full Code [id number]  Algernon Huxley, MD Inpatient   03/13/2018 1521 03/21/2018 1623 Full Code [id number]  Gonzella Lex, MD Inpatient   05/28/2017 1624 05/28/2017 1926 Full Code [id number]  Algernon Huxley, MD Inpatient   05/23/2017 1057 05/23/2017 1559 Full Code [id number]  Algernon Huxley, MD Inpatient   05/14/2017 1711 05/17/2017 1736 Full Code [id number]  Demetrios Loll, MD Inpatient   10/27/2016 0922 10/27/2016 1559 Full Code [id number]  Nelva Bush, MD Inpatient   10/21/2015 1554 10/22/2015 1621 Full Code [id number]  Demetrios Loll, MD Inpatient   10/02/2015 1816 10/04/2015 1654 Full Code  [id number]  Demetrios Loll, MD Inpatient   06/08/2014 1258 06/13/2014 1854 Full Code [id number]  Arnoldo Lenis Inpatient   06/03/2014 1718 06/08/2014 1258 Full Code [id number]  Rise Mu, [name] Inpatient   Advance Care Planning Activity      TOTAL TIME TAKING CARE OF THIS PATIENT: 35 minutes.    Loletha Grayer M.D on 08/25/2020 at 1:21 PM  Between 7am to 6pm - Pager - [phone]  After 6pm go to [email or web address] - password [email or web address]  [facility]  CC: Primary care physician; Donnie Coffin,  MD

## 2020-08-25 NOTE — TOC Transition Note (Signed)
Transition of Care [facility]) - CM/SW Discharge Note   Patient Details  Name: Melinda [name] MRN: [id number] Date of Birth: 1956-04-13  [facility] CM/SW Contact:  Eileen Stanford, LCSW Phone Number: 08/25/2020, 3:10 PM   Clinical Narrative:   Clinical Social Worker facilitated patient discharge including contacting patient family and facility to confirm patient discharge plans.  Clinical information faxed to facility and family agreeable with plan.  CSW arranged ambulance transport via [facility] to [facility]  .  RN to call [phone] for report prior to discharge.    Final next level of care: Skilled Nursing Facility Barriers to Discharge: No Barriers Identified   Patient Goals and CMS Choice Patient states their goals for this hospitalization and ongoing recovery are:: to go to snf CMS Medicare.gov Compare Post Acute Care list provided to:: Patient Choice offered to / list presented to : Patient  Discharge Placement              Patient [facility] bed at:  [facility]) Patient to be transferred to facility by: [facility]   Patient and family notified of of transfer: 08/25/20  Discharge Plan and Services In-house Referral: NA   Post Acute Care Choice: [place]                               Social Determinants of Health (SDOH) Interventions     Readmission Risk Interventions No flowsheet data found.

## 2020-08-26 ENCOUNTER — Encounter: Payer: Self-pay | Admitting: Internal Medicine

## 2020-08-26 ENCOUNTER — Non-Acute Institutional Stay (SKILLED_NURSING_FACILITY): Payer: Medicare HMO | Admitting: Internal Medicine

## 2020-08-26 DIAGNOSIS — S91302A Unspecified open wound, left foot, initial encounter: Secondary | ICD-10-CM | POA: Insufficient documentation

## 2020-08-26 DIAGNOSIS — E1122 Type 2 diabetes mellitus with diabetic chronic kidney disease: Secondary | ICD-10-CM

## 2020-08-26 DIAGNOSIS — D509 Iron deficiency anemia, unspecified: Secondary | ICD-10-CM

## 2020-08-26 DIAGNOSIS — E46 Unspecified protein-calorie malnutrition: Secondary | ICD-10-CM | POA: Insufficient documentation

## 2020-08-26 DIAGNOSIS — I4891 Unspecified atrial fibrillation: Secondary | ICD-10-CM | POA: Diagnosis not present

## 2020-08-26 DIAGNOSIS — W19XXXD Unspecified fall, subsequent encounter: Secondary | ICD-10-CM | POA: Diagnosis not present

## 2020-08-26 DIAGNOSIS — N183 Chronic kidney disease, stage 3 unspecified: Secondary | ICD-10-CM

## 2020-08-26 DIAGNOSIS — I10 Essential (primary) hypertension: Secondary | ICD-10-CM | POA: Diagnosis not present

## 2020-08-26 DIAGNOSIS — I4892 Unspecified atrial flutter: Secondary | ICD-10-CM | POA: Diagnosis not present

## 2020-08-26 DIAGNOSIS — E43 Unspecified severe protein-calorie malnutrition: Secondary | ICD-10-CM

## 2020-08-26 DIAGNOSIS — I129 Hypertensive chronic kidney disease with stage 1 through stage 4 chronic kidney disease, or unspecified chronic kidney disease: Secondary | ICD-10-CM

## 2020-08-26 DIAGNOSIS — E1129 Type 2 diabetes mellitus with other diabetic kidney complication: Secondary | ICD-10-CM

## 2020-08-26 NOTE — Assessment & Plan Note (Addendum)
05/26/2020 A1c 8.0% Glucoses 164-304 as impatient 4/24- 4/27 4/26 creat 1.43/GFR 40 CKD Stage 3 B Adjust insulin based on FBS

## 2020-08-26 NOTE — Assessment & Plan Note (Addendum)
PT/OT at SNF °

## 2020-08-26 NOTE — Patient Instructions (Signed)
See assessment and plan under each diagnosis in the problem list and acutely for this visit 

## 2020-08-26 NOTE — Assessment & Plan Note (Addendum)
Blood pressure today was 171/100 and tachycardia was clinically present.  Diltiazem 60 mg twice daily will be added to the metoprolol 50 mg twice daily for blood pressure and rate control

## 2020-08-26 NOTE — Assessment & Plan Note (Addendum)
Today heart sounds are distant but tachycardia is present based on palpation of the carotid artery pulse while auscultating the heart.  Rhythm is clinically regular.  Low-dose diltiazem will be added to the metoprolol 50 mg twice daily in attempt to control rate and address elevated blood pressure.

## 2020-08-26 NOTE — Assessment & Plan Note (Signed)
08/24/2020 albumin 2.0/ total protein 5.5 Nutrition consult

## 2020-08-26 NOTE — Assessment & Plan Note (Signed)
Wound Care Nurse to follow @ SNF

## 2020-08-26 NOTE — Assessment & Plan Note (Signed)
Current H/H 12/35.1 with hypochromic/microcytic indices Continue iron

## 2020-08-26 NOTE — Progress Notes (Signed)
NURSING HOME LOCATION:  [facility]  ROOM NUMBER:  [id number]  CODE STATUS:  Full Code  PCP:  [name] MD  This is a comprehensive admission note to this SNFperformed on this date less than 30 days from date of admission. Included are preadmission medical/surgical history; reconciled medication list; family history; social history and comprehensive review of systems.  Corrections and additions to the records were documented. Comprehensive physical exam was also performed. Additionally a clinical summary was entered for each active diagnosis pertinent to this admission in the Problem List to enhance continuity of care.  HPI: Patient was hospitalized 4/24 - 08/25/2020 with recurrent falls. The patient was recently hospitalized 4/13-4/21 because of sepsis secondary to to catheter related UTI, aspiration pneumonia, and CHF exacerbation.  Parenteral antibiotics were converted to Augmentin at discharge to be taken until 4/24. Since that discharge 4/21 she has had severe generalized weakness and had fallen at least 5 times. CT of the head and cervical spine revealed no acute injuries. In the ED urinalysis was negative.  Lactic acid was 1.7.  She was noted to be in A. fib/flutter with heart rate of 120+.  Hypoxia was present with O2 sats of 88% on room air; O2 sats were 95% on 2 L.  Chest x-ray revealed no acute process.  Additional films of the left hip/pelvis were negative for fracture. AKI with superimposed on CKD stage II wasdocumented.  Initially creatinine was 1.31 with subsequent peak to 1.43; baseline is felt to be 0.9.  Initially Lasix was administered but subsequently held.  It was converted to as needed administration. 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  Their protocol was continued. PT/OT recommended SNF placement for rehab.   Follow-up in the heart failure clinic was to be pursued post SNF stay.  Past medical and  surgical history:includes PVD, diabetes with peripheral neuropathy, history of MI, ischemic cardiomyopathy, dyslipidemia, essential hypertension, GERD, chronic systolic congestive heart failure, iron deficiency anemia, and history of atrial fibs/flutter. Surgeries and procedures include history of toe amputations, aortic valve replacement, CABG, gastric Roux-en-Y, & angiograms.  Social history:non drinker;former 20 pack year smoker.  Family history:extensive history reviewed.   Review of systems: She denies any definite cardiac or neurologic prodrome prior to the falls; but she does state that her heart races frequently.  She states that Dr. Candis Musa her Cardiologist at [facility] had planned a procedure at [facility].  She described it as "burning something", apparently an ablation.  Her shortness of breath has improved.  She states that she was told she snores but there is no history of sleep apnea.  She feels that her falls may be related to the chronic numbness and tingling in her feet which is also present in her hands.  She states that she is unsteady when she tries to mobilize.  She also questions the role of the 3 toe amputations. She describes polydipsia which is associated with polyuria.  When she was hospitalized she was having some dysphagia but this has resolved at this time. She describes intermittent anxiety.  She states that she is on duloxetine which is not helped the anxiety or the neuropathy symptoms.  In reference to the anemia her only bleeding issue is intermittent epistaxis.  Constitutional: No fever, significant weight change  Eyes: No redness, discharge, pain, vision change ENT/mouth: No nasal congestion, purulent discharge, earache, change in hearing, sore throat  Cardiovascular:  No chest pain, paroxysmal nocturnal dyspnea, claudication Respiratory: No cough, sputum production, hemoptysis Gastrointestinal: No heartburn,  abdominal pain, nausea /vomiting, rectal  bleeding, melena, change in bowels Genitourinary: No dysuria, hematuria, pyuria, incontinence, nocturia Musculoskeletal: No joint stiffness, joint swelling Dermatologic: No rash, pruritus, change in appearance of skin Neurologic: No dizziness, headache, syncope, seizures Psychiatric: No significant insomnia, anorexia Endocrine: No change in hair/skin/nails, excessive hunger Hematologic/lymphatic: No significant bruising, lymphadenopathy, abnormal bleeding Allergy/immunology: No itchy/watery eyes, significant sneezing, urticaria, angioedema  Physical exam:  Pertinent or positive findings: When initially seen she was asleep and she was difficult to awaken.  Initially after awakening she was somnolent but this did clear.  Affect was flat and she was suspect as to who I was and why I was there.  Morbid obesity is present.  She has an upper plate; she is missing multiple mandibular teeth.  Distant tachycardia is suggested.  Breath sounds are decreased.  Her abdomen is massive.  Dorsalis pedis pulses are stronger than posterior tibial pulses but they are also decreased.  Half plus edema at the sock line is noted.  The upper extremities are clinically stronger to opposition than the lower extremities.  She is missing the left great toe and the third left toe as well as the right third toe.  She has some eschar and keratotic lesions over the shins.  I could not appreciate any open wounds on the left  . General appearance:  no acute distress, increased work of breathing is present.   Lymphatic: No lymphadenopathy about the head, neck, axilla. Eyes: No conjunctival inflammation or lid edema is present. There is no scleral icterus. Ears:  External ear exam shows no significant lesions or deformities.   Nose:  External nasal examination shows no deformity or inflammation. Nasal mucosa are pink and moist without lesions, exudates Oral exam:There is no oropharyngeal erythema or exudate. Neck:  No thyromegaly,  masses, tenderness noted.    Heart:  No gallop, murmur, click, rub.  Lungs:  without wheezes, rhonchi, rales, rubs. Abdomen: Bowel sounds are normal.  Abdomen is soft and nontender with no organomegaly, hernias, masses. GU: Deferred  Extremities:  No cyanosis, clubbing. Neurologic exam: Balance, Rhomberg, finger to nose testing could not be completed due to clinical state Skin: Warm & dry w/o tenting. No significant rash.  See clinical summary under each active problem in the Problem List with associated updated therapeutic plan

## 2020-08-27 ENCOUNTER — Encounter: Payer: Self-pay | Admitting: Adult Health

## 2020-08-27 ENCOUNTER — Non-Acute Institutional Stay (SKILLED_NURSING_FACILITY): Payer: Medicare HMO | Admitting: Adult Health

## 2020-08-27 DIAGNOSIS — B379 Candidiasis, unspecified: Secondary | ICD-10-CM

## 2020-08-27 DIAGNOSIS — I129 Hypertensive chronic kidney disease with stage 1 through stage 4 chronic kidney disease, or unspecified chronic kidney disease: Secondary | ICD-10-CM | POA: Diagnosis not present

## 2020-08-27 DIAGNOSIS — N183 Chronic kidney disease, stage 3 unspecified: Secondary | ICD-10-CM

## 2020-08-27 DIAGNOSIS — E1122 Type 2 diabetes mellitus with diabetic chronic kidney disease: Secondary | ICD-10-CM | POA: Diagnosis not present

## 2020-08-27 NOTE — Progress Notes (Signed)
Location:  [facility] Room Number: 212/A Place of Service:  SNF (31) Provider:  Durenda Age, DNP, FNP-BC  Patient Care Team: Donnie Coffin, MD as PCP - General (Family Medicine) Minna Merritts, MD as PCP - Cardiology (Cardiology) Vickie Epley, MD as PCP - Electrophysiology (Cardiology) Minna Merritts, MD as Consulting Physician (Cardiology) Gabriel Carina Betsey Holiday, MD as Physician Assistant (Endocrinology)  Extended Emergency Contact Information Primary Emergency Contact: [place], [place] Phone: [phone] Mobile Phone: [phone] Relation: Sister  Code Status: Full Code   Goals of care: Advanced Directive information Advanced Directives 08/27/2020  Does Patient Have a Medical Advance Directive? No  Would patient like information on creating a medical advance directive? No - Patient declined  Some encounter information is confidential and restricted. Go to Review Flowsheets activity to see all data.     Chief Complaint  Patient presents with  . Acute Visit    Labial Irritation     HPI:  Pt is a 65 y.o. female seen today for labial irritation. Camera operator and NP checked on resident. She was noted to have left labia majora irritation/redness. She stated that area burns and itches. Noted that she has erythematous rashes on her bilateral groin.  CBGs ranging from 124 to 215. She takes Novolog 4 units 3X/day and Lantus 6 units at bedtime for diabetes mellitus. Latest A1C 8.0, 05/26/20.  She was admitted to [facility] on 08/25/20 post hospital admission 08/22/20 to 08/25/20. She has fallen 5X at home due to generalized weakness. Of note, she was recently hospitalized 4/13 to 4/21 due to sepsis from catheter related UTI, aspiration pneumonia and CHF exacerbation.  She was treated in the hospital with antibiotics and discharged on Augmentin.   Past Medical History:  Diagnosis Date  . Anxiety   . Atrial fibrillation  ([place])   . Atrial flutter ([place])   . Coronary artery disease    a. 06/2014 NSTEMI s/p CABG x 4 (LIMA to LAD, VG to Diag, VG to OM, VG to PDA).  . Depression   . Diabetic neuropathy ([place])   . GERD (gastroesophageal reflux disease)   . HTN (hypertension)   . Hyperlipidemia LDL goal <70   . IDDM (insulin dependent diabetes mellitus)   . Ischemic cardiomyopathy    a. 06/2014 Echo: EF 40-45%, HK of entire inferolateral and inferior myocardium c/w infarct of RCA/LCx, GR2DD, mild MR  . Myocardial infarction ([place]) 2016  . OA (osteoarthritis) of knee   . Obesity   . Osteomyelitis ([place])   . Peripheral neuropathy   . Peripheral vascular disease ([place])    a. 09/2016 Periph Angio: CTO R popliteal, CTO L prox/mid SFA->Med Rx; b. 05/2017 PTA: LSFA (Viabahn covered stent x 2), DEB to L Post Tibial; c. 06/2017 ABI: R 0.44, L 1.05.  . Tobacco abuse    Past Surgical History:  Procedure Laterality Date  . ABDOMINAL AORTOGRAM W/LOWER EXTREMITY N/A 10/27/2016   Procedure: Abdominal Aortogram w/Lower Extremity;  Surgeon: Nelva Bush, MD;  Location: [place] [facility];  Service: Cardiovascular;  Laterality: N/A;  . AMPUTATION TOE Left 10/21/2015   Procedure: AMPUTATION TOE;  Surgeon: Sharlotte Alamo, DPM;  Location: [facility];  Service: Podiatry;  Laterality: Left;  . AMPUTATION TOE Left 05/15/2017   Procedure: AMPUTATION LEFT GREAT TOE;  Surgeon: Sharlotte Alamo, DPM;  Location: [facility];  Service: Podiatry;  Laterality: Left;  . AORTIC VALVE REPLACEMENT (AVR)/CORONARY ARTERY BYPASS GRAFTING (CABG)    . BREAST  BIOPSY    . CARDIAC CATHETERIZATION  06/2014   95% stenosis mLAD, occlusion ostial OM1, 70% stenosis LCx, 95% stenosis mRCA, EF 45%.  Marland Kitchen CARDIOVERSION N/A 04/14/2020   Procedure: CARDIOVERSION with TEE;  Surgeon: Minna Merritts, MD;  Location: [facility];  Service: Cardiovascular;  Laterality: N/A;  . CARDIOVERSION N/A 05/28/2020   Procedure: CARDIOVERSION;  Surgeon: Minna Merritts, MD;  Location: [facility];   Service: Cardiovascular;  Laterality: N/A;  . CORONARY ARTERY BYPASS GRAFT N/A 06/08/2014   Procedure: CORONARY ARTERY BYPASS GRAFTING (CABG);  Surgeon: Grace Isaac, MD;  Location: [place];  Service: Open Heart Surgery;  Laterality: N/A;  Times 4 using left internal mammary artery to LAD artery and endoscopically harvested bilateral saphenous vein to Obtuse Marginal, Diagonal and Posterior Descending coronary arteries.  . CT ABD W & PELVIS WO CM  06/2014   nl liver, gallbladder, spleen, mild diverticular changes, no bowel wall inflammation, appendix nl, no hernia, no other sig abnormalities  . GASTRIC ROUX-EN-Y N/A 02/09/2020   Procedure: LAPAROSCOPIC ROUX-EN-Y GASTRIC BYPASS WITH UPPER ENDOSCOPY;  Surgeon: Johnathan Hausen, MD;  Location: [facility];  Service: General;  Laterality: N/A;  . LOWER EXTREMITY ANGIOGRAPHY Left 05/23/2017   Procedure: LOWER EXTREMITY ANGIOGRAPHY;  Surgeon: Algernon Huxley, MD;  Location: [place] [facility];  Service: Cardiovascular;  Laterality: Left;  . LOWER EXTREMITY ANGIOGRAPHY Left 05/28/2017   Procedure: LOWER EXTREMITY ANGIOGRAPHY;  Surgeon: Algernon Huxley, MD;  Location: [place] [facility];  Service: Cardiovascular;  Laterality: Left;  . LOWER EXTREMITY ANGIOGRAPHY Left 12/16/2018   Procedure: LOWER EXTREMITY ANGIOGRAPHY;  Surgeon: Algernon Huxley, MD;  Location: [place] [facility];  Service: Cardiovascular;  Laterality: Left;  . LOWER EXTREMITY ANGIOGRAPHY Right 12/23/2018   Procedure: LOWER EXTREMITY ANGIOGRAPHY;  Surgeon: Algernon Huxley, MD;  Location: [place] [facility];  Service: Cardiovascular;  Laterality: Right;  . TEE WITHOUT CARDIOVERSION N/A 06/08/2014   Procedure: TRANSESOPHAGEAL ECHOCARDIOGRAM (TEE);  Surgeon: Grace Isaac, MD;  Location: [place];  Service: Open Heart Surgery;  Laterality: N/A;  . TEE WITHOUT CARDIOVERSION N/A 04/14/2020   Procedure: TRANSESOPHAGEAL ECHOCARDIOGRAM (TEE);  Surgeon: Minna Merritts, MD;  Location: [facility];  Service:  Cardiovascular;  Laterality: N/A;  . TOE AMPUTATION Right 12/2011   rt middle toe  . UPPER GI ENDOSCOPY N/A 02/09/2020   Procedure: UPPER GI ENDOSCOPY;  Surgeon: Johnathan Hausen, MD;  Location: [facility];  Service: General;  Laterality: N/A;  . US ECHOCARDIOGRAPHY  06/2014   EF 50-55%, HK of inf/post/inferolat walls, Ao sclerosis    No Known Allergies  Outpatient Encounter Medications as of 08/27/2020  Medication Sig  . amiodarone (PACERONE) 200 MG tablet TAKE 1 TABLET TWICE DAILY  . apixaban (ELIQUIS) 5 MG TABS tablet Take 5 mg by mouth 2 (two) times daily.  Marland Kitchen atorvastatin (LIPITOR) 80 MG tablet Take 1 tablet (80 mg total) by mouth daily at 6 PM.  . Cholecalciferol (VITAMIN D3) 50 MCG (2000 UT) TABS Take 2,000 Units by mouth daily.  . clotrimazole (LOTRIMIN) 1 % cream Apply  as directed to affected area twice a day  for fungal infection  . diltiazem (CARDIZEM) 60 MG tablet Take 60 mg by mouth 2 (two) times daily.  Marland Kitchen docusate sodium (COLACE) 100 MG capsule Take 100 mg by mouth 2 (two) times daily.  . DULoxetine (CYMBALTA) 60 MG capsule Take 60 mg by mouth daily.   . Emollient (CERAVE EX) Apply 1 application topically in the  morning and at bedtime.  . furosemide (LASIX) 20 MG tablet Take 2 tablets (40 mg total) by mouth daily as needed (fluid retention.).  Marland Kitchen insulin aspart (NOVOLOG) 100 UNIT/ML injection Inject 4 Units into the skin 3 (three) times daily with meals.  . insulin glargine (LANTUS) 100 UNIT/ML injection Inject 0.06 mLs (6 Units total) into the skin at bedtime.  . lidocaine (LIDODERM) 5 % Place 1 patch onto the skin daily as needed (PAIN.).   Marland Kitchen LYRICA 150 MG capsule Take 1 capsule by mouth three times a day  for nerve pain  . metoprolol tartrate (LOPRESSOR) 50 MG tablet Take 1 tablet (50 mg total) by mouth 2 (two) times daily.  . Multiple Vitamins-Minerals (MULTIVITAMIN WITH MINERALS) tablet Take 1 tablet by mouth in the morning and at bedtime.  . NON FORMULARY Med Pass Give 170ml  po BID  . oxybutynin (DITROPAN) 5 MG tablet Take 5 mg by mouth 2 (two) times daily.   . pantoprazole (PROTONIX) 40 MG tablet Take 1 tablet (40 mg total) by mouth daily.  Marland Kitchen tiZANidine (ZANAFLEX) 4 MG tablet Take 4 mg by mouth 3 (three) times daily.  . varenicline (CHANTIX) 1 MG tablet Take 1 mg by mouth daily. (Patient not taking: Reported on 09/02/2020)  . [DISCONTINUED] potassium chloride SA (KLOR-CON) 20 MEQ tablet TAKE 1 TABLET (20 MEQ TOTAL) BY MOUTH DAILY AS NEEDED.  Marland Kitchen ACCU-CHEK AVIVA PLUS test strip 1 each 3 (three) times daily. (Patient not taking: Reported on 08/27/2020)  . Accu-Chek Softclix Lancets lancets 1 each 3 (three) times daily. (Patient not taking: Reported on 08/27/2020)  . Alcohol Swabs (B-D SINGLE USE SWABS REGULAR) PADS Apply topically. (Patient not taking: Reported on 08/27/2020)  . DROPLET PEN NEEDLES 31G X 6 MM MISC   . [DISCONTINUED] apixaban (ELIQUIS) 5 MG TABS tablet Take 1 tablet (5 mg total) by mouth 2 (two) times daily.  . [DISCONTINUED] Eflornithine HCl 13.9 % cream Apply to upper lip, chin and neck twice a day. (Patient taking differently: Apply 1 application topically 2 (two) times daily. Apply to upper lip, chin and neck twice a day.)  . [DISCONTINUED] ferrous sulfate 325 (65 FE) MG tablet Take 325 mg by mouth daily.   No facility-administered encounter medications on file as of 08/27/2020.    Review of Systems  GENERAL: No change in appetite, no fatigue, no weight changes, no fever, chills  MOUTH and THROAT: Denies oral discomfort, gingival pain or bleeding, pain from teeth or hoarseness   RESPIRATORY: no cough, SOB, DOE, wheezing, hemoptysis CARDIAC: No chest pain or palpitations GI: No abdominal pain, diarrhea, constipation, heart burn, nausea or vomiting GU: Denies dysuria, frequency, hematuria, incontinence, or discharge NEUROLOGICAL: Denies dizziness, syncope, numbness, or headache PSYCHIATRIC: Denies feelings of depression or anxiety. No report of  hallucinations, insomnia, paranoia, or agitation   Immunization History  Administered Date(s) Administered  . Influenza,inj,Quad PF,6+ Mos 05/04/2014, 05/17/2017  . Influenza-Unspecified 02/01/2014  . Pneumococcal Polysaccharide-23 11/20/2013, 10/22/2015  . Pneumococcal-Unspecified 04/03/2014   Pertinent  Health Maintenance Due  Topic Date Due  . OPHTHALMOLOGY EXAM  Never done  . COLONOSCOPY (Pts 45-69yrs Insurance coverage will need to be confirmed)  Never done  . MAMMOGRAM  Never done  . FOOT EXAM  10/10/2017  . URINE MICROALBUMIN  10/10/2017  . DEXA SCAN  Never done  . PNA vac Low Risk Adult (1 of 2 - PCV13) 10/16/2018  . HEMOGLOBIN A1C  11/23/2020  . INFLUENZA VACCINE  11/29/2020   Fall Risk  04/16/2020 02/03/2019 11/17/2014  Falls in the past year? 0 0 Yes  Number falls in past yr: - - 1  Injury with Fall? - - No  Risk for fall due to : - - History of fall(s);Medication side effect  Follow up - - Education provided;Falls prevention discussed     Vitals:   08/27/20 1057  BP: 121/71  Pulse: (!) 105  Resp: 18  Temp: (!) 96.8 F (36 C)  SpO2: 96%  Weight: 233 lb 12.8 oz (106.1 kg)  Height: 5\' 8"  (1.727 m)   Body mass index is 35.55 kg/m.  Physical Exam  GENERAL APPEARANCE: Well nourished. In no acute distress. Obese SKIN:  See HPI MOUTH and THROAT: Lips are without lesions. Oral mucosa is moist and without lesions.  RESPIRATORY: Breathing is even & unlabored, BS CTAB CARDIAC: RRR, no murmur,no extra heart sounds, BLE trace edema GI: Abdomen soft, normal BS, no masses, no tenderness  EXTREMITIES:  Left great toe and 3rd toe amputated (old) NEUROLOGICAL: There is no tremor. Speech is clear. PSYCHIATRIC: Affect and behavior are appropriate  Labs reviewed: Recent Labs    03/02/20 1250 04/13/20 0715 08/12/20 0411 08/13/20 0408 08/16/20 [id number] 08/16/20 0900 08/22/20 0858 08/23/20 0609 08/23/20 0952 08/24/20 0322 08/25/20 0623  NA 137   < > 140   < > 141    < > 137 137  --  135 138  K 4.9   < > 3.5   < > 3.8   < > 3.9 3.9  --  4.3 4.0  CL 105   < > 101   < > 99   < > 98 99  --  100 103  CO2 22   < > 28   < > 31   < > 28 28  --  27 26  GLUCOSE 166*   < > 166*   < > 252*   < > 164* 190*  --  304* 208*  BUN 49*   < > 16   < > 17   < > 17 14  --  16 21  CREATININE 1.53*   < > 1.40*   < > 1.06*   < > 1.31* 1.06*  --  1.44* 1.43*  CALCIUM 9.2   < > 8.9   < > 9.0   < > 8.5* 8.1*  --  8.0* 8.4*  MG  --    < > 1.8  --  1.9  --  1.7  --  1.7  --   --   PHOS 2.9  --  3.4  --   --   --   --   --   --   --   --    < > = values in this interval not displayed.   Recent Labs    08/17/20 0410 08/22/20 0858 08/24/20 0322  AST 30 50* 33  ALT 31 46* 33  ALKPHOS 131* 108 100  BILITOT 1.1 1.1 0.6  PROT 6.7 6.8 5.5*  ALBUMIN 2.4* 2.4* 2.0*   Recent Labs    08/12/20 0411 08/13/20 0408 08/19/20 0735 08/22/20 0858 08/23/20 0952 08/24/20 0322 08/25/20 0623  WBC 13.1*   < > 13.4* 10.1 9.4 9.3 8.4  NEUTROABS 11.1*  --  11.0* 7.1  --   --   --   HGB 13.6   < > 12.8 13.2 13.8 11.5* 12.0  HCT 39.4   < > 38.0 39.7 41.6 35.5* 35.1*  MCV 74.1*   < >  73.1* 73.7* 75.6* 76.3* 74.4*  PLT 331   < > 323 388 453* 391 408*   < > = values in this interval not displayed.   Lab Results  Component Value Date   TSH 2.314 08/23/2020   Lab Results  Component Value Date   HGBA1C 8.0 (H) 05/26/2020   Lab Results  Component Value Date   CHOL 74 04/13/2020   HDL 33 (L) 04/13/2020   LDLCALC 23 04/13/2020   LDLDIRECT 27.8 04/13/2020   TRIG 89 04/13/2020   CHOLHDL 2.2 04/13/2020    Significant Diagnostic Results in last 30 days:  CT Head Wo Contrast  Result Date: 08/22/2020 CLINICAL DATA:  Multiple falls. On anticoagulation. Head trauma. Left-sided neck pain. EXAM: CT HEAD WITHOUT CONTRAST CT CERVICAL SPINE WITHOUT CONTRAST TECHNIQUE: Multidetector CT imaging of the head and cervical spine was performed following the standard protocol without intravenous contrast.  Multiplanar CT image reconstructions of the cervical spine were also generated. COMPARISON:  08/11/2020, 08/16/2020 FINDINGS: CT HEAD FINDINGS Brain: No evidence of acute infarction, hemorrhage, hydrocephalus, extra-axial collection or mass lesion/mass effect. Scattered low-density changes within the periventricular and subcortical white matter compatible with chronic microvascular ischemic change. Vascular: Atherosclerotic calcifications involving the large vessels of the skull base. No unexpected hyperdense vessel. Skull: Normal. Negative for fracture or focal lesion. Sinuses/Orbits: No acute finding. Other: Negative for scalp hematoma CT CERVICAL SPINE FINDINGS Alignment: Facet joints are aligned without dislocation or traumatic listhesis. Dens and lateral masses are aligned. Skull base and vertebrae: No acute fracture. No primary bone lesion or focal pathologic process. Soft tissues and spinal canal: No prevertebral fluid or swelling. No visible canal hematoma. Disc levels: Moderate multilevel cervical spondylosis, which appears most pronounced at the C3-4 through C5-6 levels. No progression from prior. Upper chest: Included lung apices are clear Other: Degenerative changes of the bilateral sternoclavicular joints. Bilateral carotid atherosclerosis. IMPRESSION: 1. No acute intracranial findings. 2. No evidence of acute fracture or traumatic listhesis of the cervical spine. 3. Moderate multilevel cervical spondylosis, most pronounced at the C3-4 through C5-6 levels. Electronically Signed   By: Davina Poke D.O.   On: 08/22/2020 09:20   CT HEAD WO CONTRAST  Result Date: 08/16/2020 CLINICAL DATA:  Altered mental status. EXAM: CT HEAD WITHOUT CONTRAST TECHNIQUE: Contiguous axial images were obtained from the base of the skull through the vertex without intravenous contrast. Exam is limited due to patient motion artifact. COMPARISON:  August 11, 2020. FINDINGS: Brain: Limited exam due to patient motion  artifact. No mass effect or midline shift is noted. Ventricular size is within normal limits. There is no definite evidence of mass lesion, hemorrhage or acute infarction. Vascular: No hyperdense vessel or unexpected calcification. Skull: Normal. Negative for fracture or focal lesion. Sinuses/Orbits: No acute finding. Other: None. IMPRESSION: Significantly limited exam due to persistent patient motion artifact. No definite evidence of acute intracranial abnormality is noted. Electronically Signed   By: Marijo Conception M.D.   On: 08/16/2020 10:00   CT Head Wo Contrast  Result Date: 08/11/2020 CLINICAL DATA:  Neck injury.  Fall. EXAM: CT HEAD WITHOUT CONTRAST CT CERVICAL SPINE WITHOUT CONTRAST TECHNIQUE: Multidetector CT imaging of the head and cervical spine was performed following the standard protocol without intravenous contrast. Multiplanar CT image reconstructions of the cervical spine were also generated. COMPARISON:  None. FINDINGS: CT HEAD FINDINGS Brain: No evidence of acute infarction, hemorrhage, hydrocephalus, extra-axial collection or mass lesion/mass effect. Vascular: No hyperdense vessel or unexpected calcification. Skull: Normal. Negative  for fracture or focal lesion. Sinuses/Orbits: No acute finding. Other: None. CT CERVICAL SPINE FINDINGS Alignment: Normal. Skull base and vertebrae: No acute fracture. No primary bone lesion or focal pathologic process. Soft tissues and spinal canal: No prevertebral fluid or swelling. No visible canal hematoma. Disc levels: Moderate degenerative disc disease is noted at C3-4, C5-6 and C6-7 with anterior osteophyte formation. Other: None. IMPRESSION: Normal head CT. Moderate multilevel degenerative disc disease. No acute abnormality seen in the cervical spine. Electronically Signed   By: Marijo Conception M.D.   On: 08/11/2020 16:34   CT Angio Chest PE W and/or Wo Contrast  Result Date: 08/11/2020 CLINICAL DATA:  Shortness of breath EXAM: CT ANGIOGRAPHY CHEST  WITH CONTRAST TECHNIQUE: Multidetector CT imaging of the chest was performed using the standard protocol during bolus administration of intravenous contrast. Multiplanar CT image reconstructions and MIPs were obtained to evaluate the vascular anatomy. CONTRAST:  28mL OMNIPAQUE IOHEXOL 350 MG/ML SOLN COMPARISON:  None. FINDINGS: Cardiovascular: Cardiomegaly. Prior CABG. Aortic atherosclerosis. No aneurysm. No filling defects in the pulmonary arteries to suggest pulmonary emboli. Mediastinum/Nodes: Borderline sized mediastinal lymph nodes diffusely as well as bilateral hilar lymph nodes, likely reactive. Trachea and esophagus are unremarkable. Thyroid unremarkable. Lungs/Pleura: Extensive bilateral airspace disease and small to moderate bilateral effusions. Findings concerning for multifocal pneumonia. Upper Abdomen: Imaging into the upper abdomen demonstrates no acute findings. Musculoskeletal: Chest wall soft tissues are unremarkable. No acute bony abnormality. Review of the MIP images confirms the above findings. IMPRESSION: Extensive bilateral airspace disease with small to moderate bilateral pleural effusions. Findings concerning for multifocal pneumonia. No evidence of pulmonary embolus. Cardiomegaly. Aortic Atherosclerosis (ICD10-I70.0). Electronically Signed   By: Rolm Baptise M.D.   On: 08/11/2020 16:36   CT Cervical Spine Wo Contrast  Result Date: 08/22/2020 CLINICAL DATA:  Multiple falls. On anticoagulation. Head trauma. Left-sided neck pain. EXAM: CT HEAD WITHOUT CONTRAST CT CERVICAL SPINE WITHOUT CONTRAST TECHNIQUE: Multidetector CT imaging of the head and cervical spine was performed following the standard protocol without intravenous contrast. Multiplanar CT image reconstructions of the cervical spine were also generated. COMPARISON:  08/11/2020, 08/16/2020 FINDINGS: CT HEAD FINDINGS Brain: No evidence of acute infarction, hemorrhage, hydrocephalus, extra-axial collection or mass lesion/mass effect.  Scattered low-density changes within the periventricular and subcortical white matter compatible with chronic microvascular ischemic change. Vascular: Atherosclerotic calcifications involving the large vessels of the skull base. No unexpected hyperdense vessel. Skull: Normal. Negative for fracture or focal lesion. Sinuses/Orbits: No acute finding. Other: Negative for scalp hematoma CT CERVICAL SPINE FINDINGS Alignment: Facet joints are aligned without dislocation or traumatic listhesis. Dens and lateral masses are aligned. Skull base and vertebrae: No acute fracture. No primary bone lesion or focal pathologic process. Soft tissues and spinal canal: No prevertebral fluid or swelling. No visible canal hematoma. Disc levels: Moderate multilevel cervical spondylosis, which appears most pronounced at the C3-4 through C5-6 levels. No progression from prior. Upper chest: Included lung apices are clear Other: Degenerative changes of the bilateral sternoclavicular joints. Bilateral carotid atherosclerosis. IMPRESSION: 1. No acute intracranial findings. 2. No evidence of acute fracture or traumatic listhesis of the cervical spine. 3. Moderate multilevel cervical spondylosis, most pronounced at the C3-4 through C5-6 levels. Electronically Signed   By: Davina Poke D.O.   On: 08/22/2020 09:20   CT Cervical Spine Wo Contrast  Result Date: 08/11/2020 CLINICAL DATA:  Neck injury.  Fall. EXAM: CT HEAD WITHOUT CONTRAST CT CERVICAL SPINE WITHOUT CONTRAST TECHNIQUE: Multidetector CT imaging of the head and  cervical spine was performed following the standard protocol without intravenous contrast. Multiplanar CT image reconstructions of the cervical spine were also generated. COMPARISON:  None. FINDINGS: CT HEAD FINDINGS Brain: No evidence of acute infarction, hemorrhage, hydrocephalus, extra-axial collection or mass lesion/mass effect. Vascular: No hyperdense vessel or unexpected calcification. Skull: Normal. Negative for  fracture or focal lesion. Sinuses/Orbits: No acute finding. Other: None. CT CERVICAL SPINE FINDINGS Alignment: Normal. Skull base and vertebrae: No acute fracture. No primary bone lesion or focal pathologic process. Soft tissues and spinal canal: No prevertebral fluid or swelling. No visible canal hematoma. Disc levels: Moderate degenerative disc disease is noted at C3-4, C5-6 and C6-7 with anterior osteophyte formation. Other: None. IMPRESSION: Normal head CT. Moderate multilevel degenerative disc disease. No acute abnormality seen in the cervical spine. Electronically Signed   By: Marijo Conception M.D.   On: 08/11/2020 16:34   CT ABDOMEN PELVIS W CONTRAST  Result Date: 08/11/2020 CLINICAL DATA:  Abdominal distention EXAM: CT ABDOMEN AND PELVIS WITH CONTRAST TECHNIQUE: Multidetector CT imaging of the abdomen and pelvis was performed using the standard protocol following bolus administration of intravenous contrast. CONTRAST:  82mL OMNIPAQUE IOHEXOL 350 MG/ML SOLN COMPARISON:  09/06/2015 FINDINGS: Lower chest: Bilateral pleural effusions with bilateral lower lobe airspace disease. See further discussion on chest CT. Hepatobiliary: No focal hepatic abnormality. Gallbladder unremarkable. Pancreas: No focal abnormality or ductal dilatation. Spleen: No focal abnormality.  Normal size. Adrenals/Urinary Tract: Fullness of the adrenal glands bilaterally, likely hyperplasia. No renal mass or hydronephrosis. Urinary bladder unremarkable. Stomach/Bowel: Sigmoid diverticulosis. No active diverticulitis. Scattered right colonic diverticulosis. Stomach and small bowel decompressed, unremarkable. Prior gastric bypass. Vascular/Lymphatic: Diffuse aortoiliac atherosclerosis. No evidence of aneurysm or adenopathy. Reproductive: No mass. Other: No free fluid or free air. Musculoskeletal: No acute bony abnormality. IMPRESSION: Bilateral pleural effusions with lower lobe airspace disease. See further discussion on chest CT report.  Scattered colonic diverticulosis. No active diverticulitis. Aortoiliac atherosclerosis. No acute findings in the abdomen or pelvis. Electronically Signed   By: Rolm Baptise M.D.   On: 08/11/2020 16:38   DG Chest Portable 1 View  Result Date: 08/22/2020 CLINICAL DATA:  Recurrent falls. Coronary artery disease. Left hip pain. EXAM: PORTABLE CHEST 1 VIEW COMPARISON:  08/15/2020 FINDINGS: Heart size is within normal limits. Prior CABG again noted. No evidence of pulmonary infiltrate or edema. No evidence of pleural effusion. IMPRESSION: No active disease. Electronically Signed   By: Marlaine Hind M.D.   On: 08/22/2020 09:48   DG Chest Port 1 View  Result Date: 08/15/2020 CLINICAL DATA:  Shortness of breath EXAM: PORTABLE CHEST 1 VIEW COMPARISON:  08/11/2020 FINDINGS: Cardiomegaly. Prior CABG. Improving bilateral airspace disease since previous study. Mild residual lower lobe airspace opacities. No effusions or acute bony abnormality. IMPRESSION: Improving bilateral airspace disease with residual lower lobe airspace opacities. Electronically Signed   By: Rolm Baptise M.D.   On: 08/15/2020 03:23   DG Chest Portable 1 View  Result Date: 08/11/2020 CLINICAL DATA:  Short of breath.  Hypoxia. EXAM: PORTABLE CHEST 1 VIEW COMPARISON:  07/01/2018. FINDINGS: Patchy bilateral airspace lung opacities are noted, most evident peripherally and at the bases. Possible small pleural effusions.  No pneumothorax. 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 Cardiac silhouette is mildly enlarged. No convincing mediastinal or hilar masses. Skeletal structures are grossly intact. IMPRESSION: 1. Bilateral airspace lung opacities, which may reflect multifocal pneumonia. Pulmonary edema is possible, supported by possible small effusions and mild cardiomegaly. Electronically Signed   By: Lajean Manes  M.D.   On: 08/11/2020 14:23   ECHOCARDIOGRAM COMPLETE  Result Date: 08/12/2020    ECHOCARDIOGRAM REPORT   Patient Name:   [facility]  [facility] Date of Exam: 08/12/2020 Medical Rec #:  [id number]          Height:       68.5 in Accession #:    [id number]         Weight:       249.1 lb Date of Birth:  30-Jun-1955          BSA:          2.256 m Patient Age:    64 years           BP:           141/67 mmHg Patient Gender: F                  HR:           95 bpm. Exam Location:  [facility] Procedure: 2D Echo, Color Doppler, Cardiac Doppler and Strain Analysis Indications:     I50.21 CHF-Acute Systolic  History:         Patient has prior history of Echocardiogram examinations, most                  recent 04/14/2020. Ischemic Cardiomyopathy, Previous Myocardial                  Infarction and CAD, Prior CABG, PVD; Risk Factors:Current                  Smoker, Hypertension, Diabetes and Dyslipidemia.  Sonographer:     Humphrey Rolls RDCS (AE) Referring Phys:  [id number] Oliver Pila HALL Diagnosing Phys: Julien Nordmann MD  Sonographer Comments: Global longitudinal strain was attempted. IMPRESSIONS  1. Left ventricular ejection fraction, by estimation, is 30 to 35%. The left ventricle has moderately decreased function. The left ventricle demonstrates global hypokinesis. Left ventricular diastolic parameters are indeterminate. The average left ventricular global longitudinal strain is -12.5 %. The global longitudinal strain is abnormal.  2. Right ventricular systolic function is normal. The right ventricular size is normal. There is normal pulmonary artery systolic pressure.  3. Left atrial size was moderately dilated.  4. The mitral valve is normal in structure. Mild mitral valve regurgitation. FINDINGS  Left Ventricle: Left ventricular ejection fraction, by estimation, is 30 to 35%. The left ventricle has moderately decreased function. The left ventricle demonstrates global hypokinesis. The average left ventricular global longitudinal strain is -12.5 %. The global longitudinal strain is abnormal. The left ventricular internal cavity size was normal in size. There is no left  ventricular hypertrophy. Left ventricular diastolic parameters are indeterminate. Right Ventricle: The right ventricular size is normal. No increase in right ventricular wall thickness. Right ventricular systolic function is normal. There is normal pulmonary artery systolic pressure. The tricuspid regurgitant velocity is 2.06 m/s, and  with an assumed right atrial pressure of 5 mmHg, the estimated right ventricular systolic pressure is 22.0 mmHg. Left Atrium: Left atrial size was moderately dilated. Right Atrium: Right atrial size was normal in size. Pericardium: There is no evidence of pericardial effusion. Mitral Valve: The mitral valve is normal in structure. Mild mitral valve regurgitation. No evidence of mitral valve stenosis. MV peak gradient, 6.5 mmHg. The mean mitral valve gradient is 4.0 mmHg. Tricuspid Valve: The tricuspid valve is normal in structure. Tricuspid valve regurgitation is mild . No evidence of tricuspid stenosis. Aortic Valve: The aortic  valve is normal in structure. Aortic valve regurgitation is not visualized. Mild aortic valve sclerosis is present, with no evidence of aortic valve stenosis. Aortic valve mean gradient measures 4.0 mmHg. Aortic valve peak gradient measures 6.0 mmHg. Aortic valve area, by VTI measures 3.08 cm. Pulmonic Valve: The pulmonic valve was normal in structure. Pulmonic valve regurgitation is not visualized. No evidence of pulmonic stenosis. Aorta: The aortic root is normal in size and structure. Venous: The inferior vena cava is normal in size with greater than 50% respiratory variability, suggesting right atrial pressure of 3 mmHg. IAS/Shunts: No atrial level shunt detected by color flow Doppler.  LEFT VENTRICLE PLAX 2D LVIDd:         5.50 cm      Diastology LVIDs:         4.80 cm      LV e' medial:    8.16 cm/s LV PW:         1.10 cm      LV E/e' medial:  15.6 LV IVS:        1.10 cm      LV e' lateral:   9.36 cm/s LVOT diam:     2.30 cm      LV E/e' lateral: 13.6  LV SV:         69 LV SV Index:   31           2D Longitudinal Strain LVOT Area:     4.15 cm     2D Strain GLS Avg:     -12.5 %  LV Volumes (MOD) LV vol d, MOD A2C: 161.0 ml LV vol d, MOD A4C: 134.0 ml LV vol s, MOD A2C: 88.2 ml LV vol s, MOD A4C: 80.2 ml LV SV MOD A2C:     72.8 ml LV SV MOD A4C:     134.0 ml LV SV MOD BP:      63.1 ml RIGHT VENTRICLE RV Basal diam:  3.70 cm LEFT ATRIUM             Index       RIGHT ATRIUM           Index LA diam:        4.80 cm 2.13 cm/m  RA Area:     18.10 cm LA Vol (A2C):   81.2 ml 35.99 ml/m RA Volume:   48.20 ml  21.37 ml/m LA Vol (A4C):   81.0 ml 35.91 ml/m LA Biplane Vol: 84.1 ml 37.28 ml/m  AORTIC VALVE                   PULMONIC VALVE AV Area (Vmax):    3.18 cm    PV Vmax:       1.08 m/s AV Area (Vmean):   2.94 cm    PV Vmean:      78.400 cm/s AV Area (VTI):     3.08 cm    PV VTI:        0.209 m AV Vmax:           122.00 cm/s PV Peak grad:  4.7 mmHg AV Vmean:          91.900 cm/s PV Mean grad:  3.0 mmHg AV VTI:            0.224 m AV Peak Grad:      6.0 mmHg AV Mean Grad:      4.0 mmHg LVOT Vmax:  93.30 cm/s LVOT Vmean:        65.000 cm/s LVOT VTI:          0.166 m LVOT/AV VTI ratio: 0.74  AORTA Ao Root diam: 3.30 cm MITRAL VALVE                TRICUSPID VALVE MV Area (PHT): 4.85 cm     TR Peak grad:   17.0 mmHg MV Area VTI:   3.06 cm     TR Vmax:        206.00 cm/s MV Peak grad:  6.5 mmHg MV Mean grad:  4.0 mmHg     SHUNTS MV Vmax:       1.27 m/s     Systemic VTI:  0.17 m MV Vmean:      94.5 cm/s    Systemic Diam: 2.30 cm MV Decel Time: 157 msec MV E velocity: 127.00 cm/s Ida Rogue MD Electronically signed by Ida Rogue MD Signature Date/Time: 08/12/2020/10:32:58 AM    Final    DG Hip Unilat W or Wo Pelvis 2-3 Views Left  Result Date: 08/22/2020 CLINICAL DATA:  Recurrent fall.  Left hip pain. EXAM: DG HIP (WITH OR WITHOUT PELVIS) 2-3V LEFT COMPARISON:  None. FINDINGS: Stable chronic deformity of the right hip. No signs of acute left hip  fracture or dislocation. Bilateral hip osteoarthritis. Extensive vascular calcifications noted. IMPRESSION: 1. No acute findings. 2. Chronic deformity of the right hip. Electronically Signed   By: Kerby Moors M.D.   On: 08/22/2020 09:46    Assessment/Plan  1. Candida infection -  Will start on nystatin powder 100,000 unit/gm apply topically to bilateral groin and left labia majora 4 times daily -  Keep area clean and dry  2. Type 2 DM with CKD stage 3 and hypertension (HCC) Lab Results  Component Value Date   HGBA1C 8.0 (H) 05/26/2020   -Continue NovoLog and Lantus -Continue CBG monitoring     Family/ staff Communication: Discussed plan of care with resident and charge nurse.  Labs/tests ordered: None  Goals of care:   Short-term care   Durenda Age, DNP, MSN, FNP-BC [facility] [phone] (Monday-Friday 8:00 a.m. - 5:00 p.m.) [phone] (after hours)

## 2020-08-27 NOTE — Assessment & Plan Note (Signed)
Slight tachycardia today but rhythm regular clinically

## 2020-08-30 ENCOUNTER — Ambulatory Visit: Payer: Medicare HMO | Admitting: Cardiovascular Disease

## 2020-09-02 ENCOUNTER — Other Ambulatory Visit: Payer: Self-pay

## 2020-09-02 ENCOUNTER — Non-Acute Institutional Stay (SKILLED_NURSING_FACILITY): Payer: Medicare HMO | Admitting: Internal Medicine

## 2020-09-02 ENCOUNTER — Encounter: Payer: Self-pay | Admitting: Internal Medicine

## 2020-09-02 DIAGNOSIS — IMO0002 Reserved for concepts with insufficient information to code with codable children: Secondary | ICD-10-CM

## 2020-09-02 DIAGNOSIS — R296 Repeated falls: Secondary | ICD-10-CM | POA: Diagnosis not present

## 2020-09-02 DIAGNOSIS — Z794 Long term (current) use of insulin: Secondary | ICD-10-CM

## 2020-09-02 DIAGNOSIS — E1159 Type 2 diabetes mellitus with other circulatory complications: Secondary | ICD-10-CM | POA: Diagnosis not present

## 2020-09-02 DIAGNOSIS — E1322 Other specified diabetes mellitus with diabetic chronic kidney disease: Secondary | ICD-10-CM | POA: Diagnosis not present

## 2020-09-02 DIAGNOSIS — N183 Chronic kidney disease, stage 3 unspecified: Secondary | ICD-10-CM

## 2020-09-02 DIAGNOSIS — I480 Paroxysmal atrial fibrillation: Secondary | ICD-10-CM | POA: Diagnosis not present

## 2020-09-02 DIAGNOSIS — E1365 Other specified diabetes mellitus with hyperglycemia: Secondary | ICD-10-CM

## 2020-09-02 MED ORDER — POTASSIUM CHLORIDE CRYS ER 20 MEQ PO TBCR: EXTENDED_RELEASE_TABLET | ORAL | 0 refills | Status: DC

## 2020-09-02 NOTE — Assessment & Plan Note (Signed)
She states strength, balance and tone have improved with PT/OT at the SNF.  She will be discharged with a walker, 3 in 1, and grabber.  Home health nurse, PT, and OT will be ordered as well.

## 2020-09-02 NOTE — Assessment & Plan Note (Addendum)
Fixed NovoLog AC dosage we will continue along with basal insulin. Read all labels ; avoid  foods & drinks with  High Fructose Corn Syrup as #1, 2 , 3 or # 4 on label. (Note : dividing the "grams of sugar" on label by 4 gives "teaspoons of sugar" content of food or drink. For example a 22 oz Coke has 68 grams of sugar or 17 tsp of sugar).  Consumption of high fructose corn syrup sugar in foods and drinks will result in central weight gain and will make it difficult to control your diabetes.

## 2020-09-02 NOTE — Patient Instructions (Addendum)
See assessment and plan under each diagnosis in the problem list and acutely for this visit  See qualifications in med list; oxybutynin, lidocaine patch, and Zanaflex deferred to prescribing physician.  Med list reviewed with the patient and active agents highlighted and indications for such discussed with her. Medication refills will be sent to [facility] [phone]

## 2020-09-02 NOTE — Progress Notes (Signed)
NURSING HOME LOCATION:  [facility] ROOM NUMBER: [id number]   CODE STATUS:  Full Code  PCP:  [name] MD  The patient is being discharged from [facility] on Friday May 6,2022  by Unice Cobble MD.  The medical history in this facility was reviewed and summarized and medical problem list was updated. Time spent and note content is documented as follows.  Summary of [facility] medical records: Patient was admitted to the facility 8/27 after being hospitalized 4/24 - 08/25/2020 because of recurrent falls.  This was actually after a recent hospitalization 4/13-4/21 for sepsis secondary to catheter related UTI, aspiration pneumonia, and CHF exacerbation.  At that discharge 4/21 parenteral antibiotics had been converted to Augmentin to be taken until 4/24. After being discharged 4/21 she had profound/ severe weakness and had fallen at least 5 times. In the ED she was noted to be in A. fib/flutter with heart rate of over 120.  Hypoxia was present with O2 sats of 88% on room air; O2 sats were 95% on 2 L.  Chest x-ray revealed no acute process.  UA was negative.  Lactic acid was 1.7.  CT of the head and cervical spine revealed no acute injuries.  Films of the left hip and pelvis were also negative for fracture. Course was complicated by AKI superimposed on CKD stage III.  Initially creatinine was 1.31 but peaked at 1.43.  Baseline creatinine is felt to be 0.9.  Because of the AKI Lasix was held and converted to as needed qd for edema. Pre-existing admission was a chronic wound on the left foot.  She was discharged to this facility for PT/OT.  Past medical history is complex and includes PVD, diabetes with CKD and peripheral neuropathy, history of MI, ischemic cardiomyopathy, dyslipidemia, essential hypertension, GERD, chronic systolic heart failure, and iron deficiency anemia. Surgeries include toe amputations for PVD, aortic valve replacement, CABG, gastric Roux-en-Y, and  multiple angiograms.  Review of systems: Pertinent or active symptoms include: She describes "sore mouth" by which she means the gums are tender when she chews food as she has lost multiple teeth.  She denies any other active symptoms with special reference to cardiopulmonary symptomatology.  She states that her strength and balance have improved significantly with PT/OThere @ the SNF.  Negative ROS: Constitutional: No fever, significant weight change  Eyes: No redness, discharge, pain, vision change ENT/mouth: No nasal congestion, purulent discharge, earache, change in hearing, sore throat  Cardiovascular: No chest pain, palpitations, paroxysmal nocturnal dyspnea, claudication, edema  Respiratory: No cough, sputum production, hemoptysis, DOE, significant snoring, apnea   Gastrointestinal: No heartburn, dysphagia, abdominal pain, nausea /vomiting, rectal bleeding, melena, change in bowels Genitourinary: No dysuria, hematuria, pyuria, incontinence, nocturia Musculoskeletal: No joint stiffness, joint swelling, weakness, pain Dermatologic: No rash, pruritus, change in appearance of skin Neurologic: No dizziness, headache, syncope, seizures Psychiatric: No significant anxiety, depression, insomnia, anorexia Endocrine: No change in hair/skin/nails, excessive thirst, excessive hunger, excessive urination  Hematologic/lymphatic: No significant bruising, lymphadenopathy, abnormal bleeding  Physical exam:  Pertinent or positive findings:Obesity is present.She has multiple missing teeth and has an upper plate.  Heart sounds are markedly distant making rhythm & rate hard to discern.  Breath sounds are decreased.  Abdomen is protuberant.  Pedal pulses are decreased.  She has 1/2+ edema.  Upper extremities are stronger to opposition than the lower extremities.  General appearance: Adequately nourished; no acute distress, increased work of breathing is present.   Lymphatic: No lymphadenopathy about the  head,  neck, axilla. Eyes: No conjunctival inflammation or lid edema is present. There is no scleral icterus. Ears:  External ear exam shows no significant lesions or deformities.   Nose:  External nasal examination shows no deformity or inflammation. Nasal mucosa are pink and moist without lesions, exudates Neck:  No thyromegaly, masses, tenderness noted.    Heart:  No definite gallop, murmur, click, rub.  Lungs: without wheezes, rhonchi, rales, rubs. Abdomen: Bowel sounds are normal. Abdomen is soft and nontender with no organomegaly, hernias, masses. GU: Deferred as previously addressed. Extremities:  No cyanosis, clubbing Neurologic exam: Balance, Rhomberg, finger to nose testing could not be completed due to clinical state Skin: Warm & dry w/o tenting. No significant  rash.  See clinical summary of Discharge Diagnoses in the Problem List with associated updated therapeutic plan  Med List reviewed & copy provided. Follow-up will be by the primary care physician in seven - 14 days.

## 2020-09-03 ENCOUNTER — Ambulatory Visit: Payer: Medicare HMO | Admitting: Family

## 2020-09-03 MED ORDER — POTASSIUM CHLORIDE CRYS ER 20 MEQ PO TBCR: EXTENDED_RELEASE_TABLET | ORAL | 0 refills | Status: AC

## 2020-09-03 MED ORDER — INSULIN GLARGINE 100 UNIT/ML ~~LOC~~ SOLN: 6.0000 [IU] | Freq: Every day | SUBCUTANEOUS | 0 refills | Status: AC

## 2020-09-03 MED ORDER — AMIODARONE HCL 200 MG PO TABS: 200.0000 mg | ORAL_TABLET | Freq: Two times a day (BID) | ORAL | 0 refills | Status: AC

## 2020-09-03 MED ORDER — DILTIAZEM HCL 60 MG PO TABS: 60.0000 mg | ORAL_TABLET | Freq: Two times a day (BID) | ORAL | 0 refills | Status: AC

## 2020-09-03 MED ORDER — METOPROLOL TARTRATE 50 MG PO TABS: 50.0000 mg | ORAL_TABLET | Freq: Two times a day (BID) | ORAL | 0 refills | Status: DC

## 2020-09-03 MED ORDER — ATORVASTATIN CALCIUM 80 MG PO TABS: 80.0000 mg | ORAL_TABLET | Freq: Every day | ORAL | 0 refills | Status: AC

## 2020-09-03 MED ORDER — LYRICA 150 MG PO CAPS: ORAL_CAPSULE | ORAL | 0 refills | Status: DC

## 2020-09-03 MED ORDER — ELIQUIS 5 MG PO TABS: 5.0000 mg | ORAL_TABLET | Freq: Two times a day (BID) | ORAL | 0 refills | Status: DC

## 2020-09-03 MED ORDER — PANTOPRAZOLE SODIUM 40 MG PO TBEC: 40.0000 mg | DELAYED_RELEASE_TABLET | Freq: Every day | ORAL | 0 refills | Status: AC

## 2020-09-03 MED ORDER — DULOXETINE HCL 60 MG PO CPEP: 60.0000 mg | ORAL_CAPSULE | Freq: Every day | ORAL | 0 refills | Status: AC

## 2020-09-03 MED ORDER — FUROSEMIDE 20 MG PO TABS: 40.0000 mg | ORAL_TABLET | Freq: Every day | ORAL | 0 refills | Status: AC | PRN

## 2020-09-03 NOTE — Addendum Note (Signed)
Addended by: Logan Bores on: 09/03/2020 02:55 PM   Modules accepted: Orders

## 2020-09-06 ENCOUNTER — Encounter: Payer: Self-pay | Admitting: Adult Health

## 2020-09-06 ENCOUNTER — Non-Acute Institutional Stay (SKILLED_NURSING_FACILITY): Payer: Medicare HMO | Admitting: Adult Health

## 2020-09-06 DIAGNOSIS — M25552 Pain in left hip: Secondary | ICD-10-CM | POA: Diagnosis not present

## 2020-09-06 DIAGNOSIS — N179 Acute kidney failure, unspecified: Secondary | ICD-10-CM

## 2020-09-06 DIAGNOSIS — E1122 Type 2 diabetes mellitus with diabetic chronic kidney disease: Secondary | ICD-10-CM

## 2020-09-06 DIAGNOSIS — N183 Chronic kidney disease, stage 3 unspecified: Secondary | ICD-10-CM

## 2020-09-06 DIAGNOSIS — I129 Hypertensive chronic kidney disease with stage 1 through stage 4 chronic kidney disease, or unspecified chronic kidney disease: Secondary | ICD-10-CM | POA: Diagnosis not present

## 2020-09-06 MED ORDER — PREGABALIN 100 MG PO CAPS: 100.0000 mg | ORAL_CAPSULE | Freq: Three times a day (TID) | ORAL | 0 refills | Status: DC

## 2020-09-06 MED ORDER — TIZANIDINE HCL 4 MG PO CAPS: 4.0000 mg | ORAL_CAPSULE | Freq: Three times a day (TID) | ORAL | 0 refills | Status: AC | PRN

## 2020-09-06 NOTE — Progress Notes (Signed)
Location:  [facility] Room Number: [place] of Service:  SNF (31) Provider:  Durenda Age, DNP, FNP-BC  Patient Care Team: Donnie Coffin, MD as PCP - General (Family Medicine) Minna Merritts, MD as PCP - Cardiology (Cardiology) Vickie Epley, MD as PCP - Electrophysiology (Cardiology) Minna Merritts, MD as Consulting Physician (Cardiology) Gabriel Carina Betsey Holiday, MD as Physician Assistant (Endocrinology)  Extended Emergency Contact Information Primary Emergency Contact: [place], [place] Phone: [phone] Mobile Phone: [phone] Relation: Sister  Code Status:  Full Code  Goals of care: Advanced Directive information Advanced Directives 08/27/2020  Does Patient Have a Medical Advance Directive? No  Would patient like information on creating a medical advance directive? No - Patient declined  Some encounter information is confidential and restricted. Go to Review Flowsheets activity to see all data.     Chief Complaint  Patient presents with  . Acute Visit    Medication Review    HPI:  Pt is a 65 y.o. female seen today for review of medications. Medication nurse reported that resident refused to take her Lyrica. She has a current order for Lyrica 150 mg TID. Resident was seen in her room today with sister at bedside. She stated that she refused it because she was getting sleepy when she takes it. She, also, takes Zanaflex 4 mg TID for muscle spasm.  She talked about having occasional left hip spasms. She was supposed to discharge home but was cancelled.  She was admitted to [facility] on 08/25/2020 post hospital admission 08/22/2020 to 08/25/2020.  She fell 5 times at home due to generalized weakness.  CT scan of the head negative.  CT scan of the cervical spine was negative for acute bony fracture but did show degenerative disc disease.  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  Creatinine was 1.31 on presentation in the ED and 1.43 upon discharge to rehab.  Lasix was initially given and then held.  Lasix was changed to as needed.  Her baseline creatinine around 0.93.  Of note, she was recently hospitalized 4/13 through 4/21 due to sepsis from catheter related UTI, aspiration pneumonia and CHF exacerbation.  She was treated in the hospital with antibiotics and discharged on Augmentin.   Past Medical History:  Diagnosis Date  . Anxiety   . Atrial fibrillation ([place])   . Atrial flutter ([place])   . Coronary artery disease    a. 06/2014 NSTEMI s/p CABG x 4 (LIMA to LAD, VG to Diag, VG to OM, VG to PDA).  . Depression   . Diabetic neuropathy ([place])   . GERD (gastroesophageal reflux disease)   . HTN (hypertension)   . Hyperlipidemia LDL goal <70   . IDDM (insulin dependent diabetes mellitus)   . Ischemic cardiomyopathy    a. 06/2014 Echo: EF 40-45%, HK of entire inferolateral and inferior myocardium c/w infarct of RCA/LCx, GR2DD, mild MR  . Myocardial infarction ([place]) 2016  . OA (osteoarthritis) of knee   . Obesity   . Osteomyelitis ([place])   . Peripheral neuropathy   . Peripheral vascular disease ([place])    a. 09/2016 Periph Angio: CTO R popliteal, CTO L prox/mid SFA->Med Rx; b. 05/2017 PTA: LSFA (Viabahn covered stent x 2), DEB to L Post Tibial; c. 06/2017 ABI: R 0.44, L 1.05.  . Tobacco abuse    Past Surgical History:  Procedure Laterality Date  . ABDOMINAL AORTOGRAM W/LOWER  EXTREMITY N/A 10/27/2016   Procedure: Abdominal Aortogram w/Lower Extremity;  Surgeon: Nelva Bush, MD;  Location: [facility];  Service: Cardiovascular;  Laterality: N/A;  . AMPUTATION TOE Left 10/21/2015   Procedure: AMPUTATION TOE;  Surgeon: Sharlotte Alamo, DPM;  Location: [facility];  Service: Podiatry;  Laterality: Left;  . AMPUTATION TOE Left 05/15/2017   Procedure: AMPUTATION LEFT GREAT TOE;  Surgeon: Sharlotte Alamo, DPM;  Location: [facility];  Service: Podiatry;  Laterality: Left;  .  AORTIC VALVE REPLACEMENT (AVR)/CORONARY ARTERY BYPASS GRAFTING (CABG)    . BREAST BIOPSY    . CARDIAC CATHETERIZATION  06/2014   95% stenosis mLAD, occlusion ostial OM1, 70% stenosis LCx, 95% stenosis mRCA, EF 45%.  Marland Kitchen CARDIOVERSION N/A 04/14/2020   Procedure: CARDIOVERSION with TEE;  Surgeon: Minna Merritts, MD;  Location: [facility];  Service: Cardiovascular;  Laterality: N/A;  . CARDIOVERSION N/A 05/28/2020   Procedure: CARDIOVERSION;  Surgeon: Minna Merritts, MD;  Location: [facility];  Service: Cardiovascular;  Laterality: N/A;  . CORONARY ARTERY BYPASS GRAFT N/A 06/08/2014   Procedure: CORONARY ARTERY BYPASS GRAFTING (CABG);  Surgeon: Grace Isaac, MD;  Location: [facility];  Service: Open Heart Surgery;  Laterality: N/A;  Times 4 using left internal mammary artery to LAD artery and endoscopically harvested bilateral saphenous vein to Obtuse Marginal, Diagonal and Posterior Descending coronary arteries.  . CT ABD W & PELVIS WO CM  06/2014   nl liver, gallbladder, spleen, mild diverticular changes, no bowel wall inflammation, appendix nl, no hernia, no other sig abnormalities  . GASTRIC ROUX-EN-Y N/A 02/09/2020   Procedure: LAPAROSCOPIC ROUX-EN-Y GASTRIC BYPASS WITH UPPER ENDOSCOPY;  Surgeon: Johnathan Hausen, MD;  Location: [facility];  Service: General;  Laterality: N/A;  . LOWER EXTREMITY ANGIOGRAPHY Left 05/23/2017   Procedure: LOWER EXTREMITY ANGIOGRAPHY;  Surgeon: Algernon Huxley, MD;  Location: [facility];  Service: Cardiovascular;  Laterality: Left;  . LOWER EXTREMITY ANGIOGRAPHY Left 05/28/2017   Procedure: LOWER EXTREMITY ANGIOGRAPHY;  Surgeon: Algernon Huxley, MD;  Location: [facility];  Service: Cardiovascular;  Laterality: Left;  . LOWER EXTREMITY ANGIOGRAPHY Left 12/16/2018   Procedure: LOWER EXTREMITY ANGIOGRAPHY;  Surgeon: Algernon Huxley, MD;  Location: [facility];  Service: Cardiovascular;  Laterality: Left;  . LOWER EXTREMITY ANGIOGRAPHY Right 12/23/2018    Procedure: LOWER EXTREMITY ANGIOGRAPHY;  Surgeon: Algernon Huxley, MD;  Location: [facility];  Service: Cardiovascular;  Laterality: Right;  . TEE WITHOUT CARDIOVERSION N/A 06/08/2014   Procedure: TRANSESOPHAGEAL ECHOCARDIOGRAM (TEE);  Surgeon: Grace Isaac, MD;  Location: [facility];  Service: Open Heart Surgery;  Laterality: N/A;  . TEE WITHOUT CARDIOVERSION N/A 04/14/2020   Procedure: TRANSESOPHAGEAL ECHOCARDIOGRAM (TEE);  Surgeon: Minna Merritts, MD;  Location: [facility];  Service: Cardiovascular;  Laterality: N/A;  . TOE AMPUTATION Right 12/2011   rt middle toe  . UPPER GI ENDOSCOPY N/A 02/09/2020   Procedure: UPPER GI ENDOSCOPY;  Surgeon: Johnathan Hausen, MD;  Location: [facility];  Service: General;  Laterality: N/A;  . US ECHOCARDIOGRAPHY  06/2014   EF 50-55%, HK of inf/post/inferolat walls, Ao sclerosis    No Known Allergies  Outpatient Encounter Medications as of 09/06/2020  Medication Sig  . pregabalin (LYRICA) 100 MG capsule Take 1 capsule (100 mg total) by mouth 3 (three) times daily.  Marland Kitchen tiZANidine (ZANAFLEX) 4 MG capsule Take 1 capsule (4 mg total) by mouth 3 (three) times daily as needed for muscle spasms.  Marland Kitchen ACCU-CHEK AVIVA  PLUS test strip 1 each 3 (three) times daily. (Patient not taking: Reported on 08/27/2020)  . Accu-Chek Softclix Lancets lancets 1 each 3 (three) times daily. (Patient not taking: Reported on 08/27/2020)  . Alcohol Swabs (B-D SINGLE USE SWABS REGULAR) PADS Apply topically. (Patient not taking: Reported on 08/27/2020)  . amiodarone (PACERONE) 200 MG tablet Take 1 tablet (200 mg total) by mouth 2 (two) times daily.  Marland Kitchen apixaban (ELIQUIS) 5 MG TABS tablet Take 1 tablet (5 mg total) by mouth 2 (two) times daily.  Marland Kitchen atorvastatin (LIPITOR) 80 MG tablet Take 1 tablet (80 mg total) by mouth daily at 6 PM.  . Cholecalciferol (VITAMIN D3) 50 MCG (2000 UT) TABS Take 2,000 Units by mouth daily.  . clotrimazole (LOTRIMIN) 1 % cream Apply  as directed to affected area  twice a day  for fungal infection  . diltiazem (CARDIZEM) 60 MG tablet Take 1 tablet (60 mg total) by mouth 2 (two) times daily.  Marland Kitchen docusate sodium (COLACE) 100 MG capsule Take 100 mg by mouth 2 (two) times daily.  . DROPLET PEN NEEDLES 31G X 6 MM MISC   . DULoxetine (CYMBALTA) 60 MG capsule Take 1 capsule (60 mg total) by mouth daily.  . Emollient (CERAVE EX) Apply 1 application topically in the morning and at bedtime.  . furosemide (LASIX) 20 MG tablet Take 2 tablets (40 mg total) by mouth daily as needed (fluid retention.).  Marland Kitchen insulin aspart (NOVOLOG) 100 UNIT/ML injection Inject 4 Units into the skin 3 (three) times daily with meals.  . insulin glargine (LANTUS) 100 UNIT/ML injection Inject 0.06 mLs (6 Units total) into the skin at bedtime.  . lidocaine (LIDODERM) 5 % Place 1 patch onto the skin daily as needed (PAIN.).   Marland Kitchen metoprolol tartrate (LOPRESSOR) 50 MG tablet Take 1 tablet (50 mg total) by mouth 2 (two) times daily.  . Multiple Vitamins-Minerals (MULTIVITAMIN WITH MINERALS) tablet Take 1 tablet by mouth in the morning and at bedtime.  . NON FORMULARY Med Pass Give 168ml po BID  . oxybutynin (DITROPAN) 5 MG tablet Take 5 mg by mouth 2 (two) times daily.   . pantoprazole (PROTONIX) 40 MG tablet Take 1 tablet (40 mg total) by mouth daily.  . potassium chloride SA (KLOR-CON) 20 MEQ tablet TAKE 1 TABLET (20 MEQ TOTAL) BY MOUTH DAILY AS NEEDED.  Marland Kitchen varenicline (CHANTIX) 1 MG tablet Take 1 mg by mouth daily. (Patient not taking: Reported on 09/02/2020)  . [DISCONTINUED] LYRICA 150 MG capsule Take 1 capsule by mouth three times a day  for nerve pain  . [DISCONTINUED] tiZANidine (ZANAFLEX) 4 MG tablet Take 4 mg by mouth in the morning, at noon, and at bedtime.   No facility-administered encounter medications on file as of 09/06/2020.    Review of Systems  GENERAL: No change in appetite, no fatigue, no weight changes, no fever, chills or weakness MOUTH and THROAT: Denies oral discomfort,  gingival pain or bleeding  RESPIRATORY: no cough, SOB, DOE, wheezing, hemoptysis CARDIAC: No chest pain, edema or palpitations GI: No abdominal pain, diarrhea, constipation, heart burn, nausea or vomiting GU: Denies dysuria, frequency, hematuria, incontinence, or discharge NEUROLOGICAL: Sleepy PSYCHIATRIC: Denies feelings of depression or anxiety. No report of hallucinations, insomnia, paranoia, or agitation   Immunization History  Administered Date(s) Administered  . Influenza,inj,Quad PF,6+ Mos 05/04/2014, 05/17/2017  . Influenza-Unspecified 02/01/2014  . Pneumococcal Polysaccharide-23 11/20/2013, 10/22/2015  . Pneumococcal-Unspecified 04/03/2014   Pertinent  Health Maintenance Due  Topic Date Due  .  OPHTHALMOLOGY EXAM  Never done  . COLONOSCOPY (Pts 45-67yrs Insurance coverage will need to be confirmed)  Never done  . MAMMOGRAM  Never done  . FOOT EXAM  10/10/2017  . URINE MICROALBUMIN  10/10/2017  . DEXA SCAN  Never done  . PNA vac Low Risk Adult (1 of 2 - PCV13) 10/16/2018  . HEMOGLOBIN A1C  11/23/2020  . INFLUENZA VACCINE  11/29/2020   Fall Risk  04/16/2020 02/03/2019 11/17/2014  Falls in the past year? 0 0 Yes  Number falls in past yr: - - 1  Injury with Fall? - - No  Risk for fall due to : - - History of fall(s);Medication side effect  Follow up - - Education provided;Falls prevention discussed     Vitals:   09/06/20 1327  BP: 129/65  Pulse: 77  Resp: 18  Temp: (!) 97.5 F (36.4 C)  Weight: 246 lb 9.6 oz (111.9 kg)  Height: 5\' 8"  (1.727 m)   Body mass index is 37.5 kg/m.  Physical Exam  GENERAL APPEARANCE: Well nourished. In no acute distress. Obese. SKIN:  Left foot with dressing.  MOUTH and THROAT: Lips are without lesions. Oral mucosa is moist and without lesions.  RESPIRATORY: Breathing is even & unlabored, BS CTAB CARDIAC: RRR, no murmur,no extra heart sounds, no edema GI: Abdomen soft, normal BS, no masses, no tenderness EXTREMITIES:  Able to move  X 4 extremities NEUROLOGICAL: There is no tremor. Speech is clear. Alert and oriented X 3. PSYCHIATRIC:  Affect and behavior are appropriate  Labs reviewed: Recent Labs    03/02/20 1250 04/13/20 0715 08/12/20 0411 08/13/20 0408 08/16/20 9753 08/16/20 0900 08/22/20 0858 08/23/20 0609 08/23/20 0952 08/24/20 0322 08/25/20 0623  NA 137   < > 140   < > 141   < > 137 137  --  135 138  K 4.9   < > 3.5   < > 3.8   < > 3.9 3.9  --  4.3 4.0  CL 105   < > 101   < > 99   < > 98 99  --  100 103  CO2 22   < > 28   < > 31   < > 28 28  --  27 26  GLUCOSE 166*   < > 166*   < > 252*   < > 164* 190*  --  304* 208*  BUN 49*   < > 16   < > 17   < > 17 14  --  16 21  CREATININE 1.53*   < > 1.40*   < > 1.06*   < > 1.31* 1.06*  --  1.44* 1.43*  CALCIUM 9.2   < > 8.9   < > 9.0   < > 8.5* 8.1*  --  8.0* 8.4*  MG  --    < > 1.8  --  1.9  --  1.7  --  1.7  --   --   PHOS 2.9  --  3.4  --   --   --   --   --   --   --   --    < > = values in this interval not displayed.   Recent Labs    08/17/20 0410 08/22/20 0858 08/24/20 0322  AST 30 50* 33  ALT 31 46* 33  ALKPHOS 131* 108 100  BILITOT 1.1 1.1 0.6  PROT 6.7 6.8 5.5*  ALBUMIN 2.4* 2.4* 2.0*  Recent Labs    08/12/20 0411 08/13/20 0408 08/19/20 0735 08/22/20 0858 08/23/20 0952 08/24/20 0322 08/25/20 0623  WBC 13.1*   < > 13.4* 10.1 9.4 9.3 8.4  NEUTROABS 11.1*  --  11.0* 7.1  --   --   --   HGB 13.6   < > 12.8 13.2 13.8 11.5* 12.0  HCT 39.4   < > 38.0 39.7 41.6 35.5* 35.1*  MCV 74.1*   < > 73.1* 73.7* 75.6* 76.3* 74.4*  PLT 331   < > 323 388 453* 391 408*   < > = values in this interval not displayed.   Lab Results  Component Value Date   TSH 2.314 08/23/2020   Lab Results  Component Value Date   HGBA1C 8.0 (H) 05/26/2020   Lab Results  Component Value Date   CHOL 74 04/13/2020   HDL 33 (L) 04/13/2020   LDLCALC 23 04/13/2020   LDLDIRECT 27.8 04/13/2020   TRIG 89 04/13/2020   CHOLHDL 2.2 04/13/2020    Significant  Diagnostic Results in last 30 days:  CT Head Wo Contrast  Result Date: 08/22/2020 CLINICAL DATA:  Multiple falls. On anticoagulation. Head trauma. Left-sided neck pain. EXAM: CT HEAD WITHOUT CONTRAST CT CERVICAL SPINE WITHOUT CONTRAST TECHNIQUE: Multidetector CT imaging of the head and cervical spine was performed following the standard protocol without intravenous contrast. Multiplanar CT image reconstructions of the cervical spine were also generated. COMPARISON:  08/11/2020, 08/16/2020 FINDINGS: CT HEAD FINDINGS Brain: No evidence of acute infarction, hemorrhage, hydrocephalus, extra-axial collection or mass lesion/mass effect. Scattered low-density changes within the periventricular and subcortical white matter compatible with chronic microvascular ischemic change. Vascular: Atherosclerotic calcifications involving the large vessels of the skull base. No unexpected hyperdense vessel. Skull: Normal. Negative for fracture or focal lesion. Sinuses/Orbits: No acute finding. Other: Negative for scalp hematoma CT CERVICAL SPINE FINDINGS Alignment: Facet joints are aligned without dislocation or traumatic listhesis. Dens and lateral masses are aligned. Skull base and vertebrae: No acute fracture. No primary bone lesion or focal pathologic process. Soft tissues and spinal canal: No prevertebral fluid or swelling. No visible canal hematoma. Disc levels: Moderate multilevel cervical spondylosis, which appears most pronounced at the C3-4 through C5-6 levels. No progression from prior. Upper chest: Included lung apices are clear Other: Degenerative changes of the bilateral sternoclavicular joints. Bilateral carotid atherosclerosis. IMPRESSION: 1. No acute intracranial findings. 2. No evidence of acute fracture or traumatic listhesis of the cervical spine. 3. Moderate multilevel cervical spondylosis, most pronounced at the C3-4 through C5-6 levels. Electronically Signed   By: Davina Poke D.O.   On: 08/22/2020 09:20    CT HEAD WO CONTRAST  Result Date: 08/16/2020 CLINICAL DATA:  Altered mental status. EXAM: CT HEAD WITHOUT CONTRAST TECHNIQUE: Contiguous axial images were obtained from the base of the skull through the vertex without intravenous contrast. Exam is limited due to patient motion artifact. COMPARISON:  August 11, 2020. FINDINGS: Brain: Limited exam due to patient motion artifact. No mass effect or midline shift is noted. Ventricular size is within normal limits. There is no definite evidence of mass lesion, hemorrhage or acute infarction. Vascular: No hyperdense vessel or unexpected calcification. Skull: Normal. Negative for fracture or focal lesion. Sinuses/Orbits: No acute finding. Other: None. IMPRESSION: Significantly limited exam due to persistent patient motion artifact. No definite evidence of acute intracranial abnormality is noted. Electronically Signed   By: Marijo Conception M.D.   On: 08/16/2020 10:00   CT Head Wo Contrast  Result Date:  08/11/2020 CLINICAL DATA:  Neck injury.  Fall. EXAM: CT HEAD WITHOUT CONTRAST CT CERVICAL SPINE WITHOUT CONTRAST TECHNIQUE: Multidetector CT imaging of the head and cervical spine was performed following the standard protocol without intravenous contrast. Multiplanar CT image reconstructions of the cervical spine were also generated. COMPARISON:  None. FINDINGS: CT HEAD FINDINGS Brain: No evidence of acute infarction, hemorrhage, hydrocephalus, extra-axial collection or mass lesion/mass effect. Vascular: No hyperdense vessel or unexpected calcification. Skull: Normal. Negative for fracture or focal lesion. Sinuses/Orbits: No acute finding. Other: None. CT CERVICAL SPINE FINDINGS Alignment: Normal. Skull base and vertebrae: No acute fracture. No primary bone lesion or focal pathologic process. Soft tissues and spinal canal: No prevertebral fluid or swelling. No visible canal hematoma. Disc levels: Moderate degenerative disc disease is noted at C3-4, C5-6 and C6-7 with  anterior osteophyte formation. Other: None. IMPRESSION: Normal head CT. Moderate multilevel degenerative disc disease. No acute abnormality seen in the cervical spine. Electronically Signed   By: Marijo Conception M.D.   On: 08/11/2020 16:34   CT Angio Chest PE W and/or Wo Contrast  Result Date: 08/11/2020 CLINICAL DATA:  Shortness of breath EXAM: CT ANGIOGRAPHY CHEST WITH CONTRAST TECHNIQUE: Multidetector CT imaging of the chest was performed using the standard protocol during bolus administration of intravenous contrast. Multiplanar CT image reconstructions and MIPs were obtained to evaluate the vascular anatomy. CONTRAST:  38mL OMNIPAQUE IOHEXOL 350 MG/ML SOLN COMPARISON:  None. FINDINGS: Cardiovascular: Cardiomegaly. Prior CABG. Aortic atherosclerosis. No aneurysm. No filling defects in the pulmonary arteries to suggest pulmonary emboli. Mediastinum/Nodes: Borderline sized mediastinal lymph nodes diffusely as well as bilateral hilar lymph nodes, likely reactive. Trachea and esophagus are unremarkable. Thyroid unremarkable. Lungs/Pleura: Extensive bilateral airspace disease and small to moderate bilateral effusions. Findings concerning for multifocal pneumonia. Upper Abdomen: Imaging into the upper abdomen demonstrates no acute findings. Musculoskeletal: Chest wall soft tissues are unremarkable. No acute bony abnormality. Review of the MIP images confirms the above findings. IMPRESSION: Extensive bilateral airspace disease with small to moderate bilateral pleural effusions. Findings concerning for multifocal pneumonia. No evidence of pulmonary embolus. Cardiomegaly. Aortic Atherosclerosis (ICD10-I70.0). Electronically Signed   By: Rolm Baptise M.D.   On: 08/11/2020 16:36   CT Cervical Spine Wo Contrast  Result Date: 08/22/2020 CLINICAL DATA:  Multiple falls. On anticoagulation. Head trauma. Left-sided neck pain. EXAM: CT HEAD WITHOUT CONTRAST CT CERVICAL SPINE WITHOUT CONTRAST TECHNIQUE: Multidetector CT  imaging of the head and cervical spine was performed following the standard protocol without intravenous contrast. Multiplanar CT image reconstructions of the cervical spine were also generated. COMPARISON:  08/11/2020, 08/16/2020 FINDINGS: CT HEAD FINDINGS Brain: No evidence of acute infarction, hemorrhage, hydrocephalus, extra-axial collection or mass lesion/mass effect. Scattered low-density changes within the periventricular and subcortical white matter compatible with chronic microvascular ischemic change. Vascular: Atherosclerotic calcifications involving the large vessels of the skull base. No unexpected hyperdense vessel. Skull: Normal. Negative for fracture or focal lesion. Sinuses/Orbits: No acute finding. Other: Negative for scalp hematoma CT CERVICAL SPINE FINDINGS Alignment: Facet joints are aligned without dislocation or traumatic listhesis. Dens and lateral masses are aligned. Skull base and vertebrae: No acute fracture. No primary bone lesion or focal pathologic process. Soft tissues and spinal canal: No prevertebral fluid or swelling. No visible canal hematoma. Disc levels: Moderate multilevel cervical spondylosis, which appears most pronounced at the C3-4 through C5-6 levels. No progression from prior. Upper chest: Included lung apices are clear Other: Degenerative changes of the bilateral sternoclavicular joints. Bilateral carotid atherosclerosis. IMPRESSION: 1. No  acute intracranial findings. 2. No evidence of acute fracture or traumatic listhesis of the cervical spine. 3. Moderate multilevel cervical spondylosis, most pronounced at the C3-4 through C5-6 levels. Electronically Signed   By: Davina Poke D.O.   On: 08/22/2020 09:20   CT Cervical Spine Wo Contrast  Result Date: 08/11/2020 CLINICAL DATA:  Neck injury.  Fall. EXAM: CT HEAD WITHOUT CONTRAST CT CERVICAL SPINE WITHOUT CONTRAST TECHNIQUE: Multidetector CT imaging of the head and cervical spine was performed following the standard  protocol without intravenous contrast. Multiplanar CT image reconstructions of the cervical spine were also generated. COMPARISON:  None. FINDINGS: CT HEAD FINDINGS Brain: No evidence of acute infarction, hemorrhage, hydrocephalus, extra-axial collection or mass lesion/mass effect. Vascular: No hyperdense vessel or unexpected calcification. Skull: Normal. Negative for fracture or focal lesion. Sinuses/Orbits: No acute finding. Other: None. CT CERVICAL SPINE FINDINGS Alignment: Normal. Skull base and vertebrae: No acute fracture. No primary bone lesion or focal pathologic process. Soft tissues and spinal canal: No prevertebral fluid or swelling. No visible canal hematoma. Disc levels: Moderate degenerative disc disease is noted at C3-4, C5-6 and C6-7 with anterior osteophyte formation. Other: None. IMPRESSION: Normal head CT. Moderate multilevel degenerative disc disease. No acute abnormality seen in the cervical spine. Electronically Signed   By: Marijo Conception M.D.   On: 08/11/2020 16:34   CT ABDOMEN PELVIS W CONTRAST  Result Date: 08/11/2020 CLINICAL DATA:  Abdominal distention EXAM: CT ABDOMEN AND PELVIS WITH CONTRAST TECHNIQUE: Multidetector CT imaging of the abdomen and pelvis was performed using the standard protocol following bolus administration of intravenous contrast. CONTRAST:  52mL OMNIPAQUE IOHEXOL 350 MG/ML SOLN COMPARISON:  09/06/2015 FINDINGS: Lower chest: Bilateral pleural effusions with bilateral lower lobe airspace disease. See further discussion on chest CT. Hepatobiliary: No focal hepatic abnormality. Gallbladder unremarkable. Pancreas: No focal abnormality or ductal dilatation. Spleen: No focal abnormality.  Normal size. Adrenals/Urinary Tract: Fullness of the adrenal glands bilaterally, likely hyperplasia. No renal mass or hydronephrosis. Urinary bladder unremarkable. Stomach/Bowel: Sigmoid diverticulosis. No active diverticulitis. Scattered right colonic diverticulosis. Stomach and small  bowel decompressed, unremarkable. Prior gastric bypass. Vascular/Lymphatic: Diffuse aortoiliac atherosclerosis. No evidence of aneurysm or adenopathy. Reproductive: No mass. Other: No free fluid or free air. Musculoskeletal: No acute bony abnormality. IMPRESSION: Bilateral pleural effusions with lower lobe airspace disease. See further discussion on chest CT report. Scattered colonic diverticulosis. No active diverticulitis. Aortoiliac atherosclerosis. No acute findings in the abdomen or pelvis. Electronically Signed   By: Rolm Baptise M.D.   On: 08/11/2020 16:38   DG Chest Portable 1 View  Result Date: 08/22/2020 CLINICAL DATA:  Recurrent falls. Coronary artery disease. Left hip pain. EXAM: PORTABLE CHEST 1 VIEW COMPARISON:  08/15/2020 FINDINGS: Heart size is within normal limits. Prior CABG again noted. No evidence of pulmonary infiltrate or edema. No evidence of pleural effusion. IMPRESSION: No active disease. Electronically Signed   By: Marlaine Hind M.D.   On: 08/22/2020 09:48   DG Chest Port 1 View  Result Date: 08/15/2020 CLINICAL DATA:  Shortness of breath EXAM: PORTABLE CHEST 1 VIEW COMPARISON:  08/11/2020 FINDINGS: Cardiomegaly. Prior CABG. Improving bilateral airspace disease since previous study. Mild residual lower lobe airspace opacities. No effusions or acute bony abnormality. IMPRESSION: Improving bilateral airspace disease with residual lower lobe airspace opacities. Electronically Signed   By: Rolm Baptise M.D.   On: 08/15/2020 03:23   DG Chest Portable 1 View  Result Date: 08/11/2020 CLINICAL DATA:  Short of breath.  Hypoxia. EXAM: PORTABLE CHEST 1  VIEW COMPARISON:  07/01/2018. FINDINGS: Patchy bilateral airspace lung opacities are noted, most evident peripherally and at the bases. Possible small pleural effusions.  No pneumothorax. 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 Cardiac silhouette is mildly enlarged. No convincing mediastinal or hilar masses. Skeletal structures are grossly  intact. IMPRESSION: 1. Bilateral airspace lung opacities, which may reflect multifocal pneumonia. Pulmonary edema is possible, supported by possible small effusions and mild cardiomegaly. Electronically Signed   By: Lajean Manes M.D.   On: 08/11/2020 14:23   ECHOCARDIOGRAM COMPLETE  Result Date: 08/12/2020    ECHOCARDIOGRAM REPORT   Patient Name:   [facility] Date of Exam: 08/12/2020 Medical Rec #:  [id number]          Height:       68.5 in Accession #:    [id number]         Weight:       249.1 lb Date of Birth:  1955/05/16          BSA:          2.256 m Patient Age:    57 years           BP:           141/67 mmHg Patient Gender: F                  HR:           95 bpm. Exam Location:  [facility] Procedure: 2D Echo, Color Doppler, Cardiac Doppler and Strain Analysis Indications:     [id number] CHF-Acute Systolic  History:         Patient has prior history of Echocardiogram examinations, most                  recent 04/14/2020. Ischemic Cardiomyopathy, Previous Myocardial                  Infarction and CAD, Prior CABG, PVD; Risk Factors:Current                  Smoker, Hypertension, Diabetes and Dyslipidemia.  Sonographer:     Charmayne Sheer RDCS (AE) Referring Phys:  [id number] [place] Diagnosing Phys: Ida Rogue MD  Sonographer Comments: Global longitudinal strain was attempted. IMPRESSIONS  1. Left ventricular ejection fraction, by estimation, is 30 to 35%. The left ventricle has moderately decreased function. The left ventricle demonstrates global hypokinesis. Left ventricular diastolic parameters are indeterminate. The average left ventricular global longitudinal strain is -12.5 %. The global longitudinal strain is abnormal.  2. Right ventricular systolic function is normal. The right ventricular size is normal. There is normal pulmonary artery systolic pressure.  3. Left atrial size was moderately dilated.  4. The mitral valve is normal in structure. Mild mitral valve regurgitation. FINDINGS  Left  Ventricle: Left ventricular ejection fraction, by estimation, is 30 to 35%. The left ventricle has moderately decreased function. The left ventricle demonstrates global hypokinesis. The average left ventricular global longitudinal strain is -12.5 %. The global longitudinal strain is abnormal. The left ventricular internal cavity size was normal in size. There is no left ventricular hypertrophy. Left ventricular diastolic parameters are indeterminate. Right Ventricle: The right ventricular size is normal. No increase in right ventricular wall thickness. Right ventricular systolic function is normal. There is normal pulmonary artery systolic pressure. The tricuspid regurgitant velocity is 2.06 m/s, and  with an assumed right atrial pressure of 5 mmHg, the estimated right ventricular systolic pressure is [id number] mmHg. Left Atrium:  Left atrial size was moderately dilated. Right Atrium: Right atrial size was normal in size. Pericardium: There is no evidence of pericardial effusion. Mitral Valve: The mitral valve is normal in structure. Mild mitral valve regurgitation. No evidence of mitral valve stenosis. MV peak gradient, 6.5 mmHg. The mean mitral valve gradient is 4.0 mmHg. Tricuspid Valve: The tricuspid valve is normal in structure. Tricuspid valve regurgitation is mild . No evidence of tricuspid stenosis. Aortic Valve: The aortic valve is normal in structure. Aortic valve regurgitation is not visualized. Mild aortic valve sclerosis is present, with no evidence of aortic valve stenosis. Aortic valve mean gradient measures 4.0 mmHg. Aortic valve peak gradient measures 6.0 mmHg. Aortic valve area, by VTI measures 3.08 cm. Pulmonic Valve: The pulmonic valve was normal in structure. Pulmonic valve regurgitation is not visualized. No evidence of pulmonic stenosis. Aorta: The aortic root is normal in size and structure. Venous: The inferior vena cava is normal in size with greater than 50% respiratory variability, suggesting  right atrial pressure of 3 mmHg. IAS/Shunts: No atrial level shunt detected by color flow Doppler.  LEFT VENTRICLE PLAX 2D LVIDd:         5.50 cm      Diastology LVIDs:         4.80 cm      LV e' medial:    8.16 cm/s LV PW:         1.10 cm      LV E/e' medial:  15.6 LV IVS:        1.10 cm      LV e' lateral:   9.36 cm/s LVOT diam:     2.30 cm      LV E/e' lateral: 13.6 LV SV:         69 LV SV Index:   31           2D Longitudinal Strain LVOT Area:     4.15 cm     2D Strain GLS Avg:     -12.5 %  LV Volumes (MOD) LV vol d, MOD A2C: 161.0 ml LV vol d, MOD A4C: 134.0 ml LV vol s, MOD A2C: 88.2 ml LV vol s, MOD A4C: 80.2 ml LV SV MOD A2C:     72.8 ml LV SV MOD A4C:     134.0 ml LV SV MOD BP:      63.1 ml RIGHT VENTRICLE RV Basal diam:  3.70 cm LEFT ATRIUM             Index       RIGHT ATRIUM           Index LA diam:        4.80 cm 2.13 cm/m  RA Area:     18.10 cm LA Vol (A2C):   81.2 ml 35.99 ml/m RA Volume:   48.20 ml  21.37 ml/m LA Vol (A4C):   81.0 ml 35.91 ml/m LA Biplane Vol: 84.1 ml 37.28 ml/m  AORTIC VALVE                   PULMONIC VALVE AV Area (Vmax):    3.18 cm    PV Vmax:       1.08 m/s AV Area (Vmean):   2.94 cm    PV Vmean:      78.400 cm/s AV Area (VTI):     3.08 cm    PV VTI:        0.209 m AV Vmax:  122.00 cm/s PV Peak grad:  4.7 mmHg AV Vmean:          91.900 cm/s PV Mean grad:  3.0 mmHg AV VTI:            0.224 m AV Peak Grad:      6.0 mmHg AV Mean Grad:      4.0 mmHg LVOT Vmax:         93.30 cm/s LVOT Vmean:        65.000 cm/s LVOT VTI:          0.166 m LVOT/AV VTI ratio: 0.74  AORTA Ao Root diam: 3.30 cm MITRAL VALVE                TRICUSPID VALVE MV Area (PHT): 4.85 cm     TR Peak grad:   17.0 mmHg MV Area VTI:   3.06 cm     TR Vmax:        206.00 cm/s MV Peak grad:  6.5 mmHg MV Mean grad:  4.0 mmHg     SHUNTS MV Vmax:       1.27 m/s     Systemic VTI:  0.17 m MV Vmean:      94.5 cm/s    Systemic Diam: 2.30 cm MV Decel Time: 157 msec MV E velocity: 127.00 cm/s Ida Rogue MD  Electronically signed by Ida Rogue MD Signature Date/Time: 08/12/2020/10:32:58 AM    Final    DG Hip Unilat W or Wo Pelvis 2-3 Views Left  Result Date: 08/22/2020 CLINICAL DATA:  Recurrent fall.  Left hip pain. EXAM: DG HIP (WITH OR WITHOUT PELVIS) 2-3V LEFT COMPARISON:  None. FINDINGS: Stable chronic deformity of the right hip. No signs of acute left hip fracture or dislocation. Bilateral hip osteoarthritis. Extensive vascular calcifications noted. IMPRESSION: 1. No acute findings. 2. Chronic deformity of the right hip. Electronically Signed   By: Kerby Moors M.D.   On: 08/22/2020 09:46    Assessment/Plan  1. Left hip pain -   Will decrease Lyrica from 150 mg TID to 100 mg 3 times daily -   Change Zanaflex 4 mg TID to PRN to decrease sleepiness  2. AKI (acute kidney injury) [facility]) Lab Results  Component Value Date   NA 138 08/25/2020   K 4.0 08/25/2020   CO2 26 08/25/2020   GLUCOSE 208 (H) 08/25/2020   BUN 21 08/25/2020   CREATININE 1.43 (H) 08/25/2020   CALCIUM 8.4 (L) 08/25/2020   GFRNONAA 40 (L) 08/25/2020   GFRAA 51 (L) 02/02/2020   -   Continue Lasix 20 mg daily PRN -    check BMP  3. Type 2 DM with CKD stage 3 and hypertension (HCC) Lab Results  Component Value Date   HGBA1C 8.0 (H) 05/26/2020   -   Will continue NovoLog 4 units SQ TID and Lantus 6 units at bedtime      Family/ staff Communication: Discussed plan of care with resident and charge nurse.  Labs/tests ordered: BMP  Goals of care:   Short-term care   Durenda Age, DNP, MSN, FNP-BC [facility] [phone] (Monday-Friday 8:00 a.m. - 5:00 p.m.) [phone] (after hours)

## 2020-09-07 ENCOUNTER — Non-Acute Institutional Stay (SKILLED_NURSING_FACILITY): Payer: Medicare HMO | Admitting: Adult Health

## 2020-09-07 ENCOUNTER — Encounter: Payer: Self-pay | Admitting: Adult Health

## 2020-09-07 DIAGNOSIS — I480 Paroxysmal atrial fibrillation: Secondary | ICD-10-CM

## 2020-09-07 DIAGNOSIS — I129 Hypertensive chronic kidney disease with stage 1 through stage 4 chronic kidney disease, or unspecified chronic kidney disease: Secondary | ICD-10-CM

## 2020-09-07 DIAGNOSIS — N179 Acute kidney failure, unspecified: Secondary | ICD-10-CM

## 2020-09-07 DIAGNOSIS — I5022 Chronic systolic (congestive) heart failure: Secondary | ICD-10-CM

## 2020-09-07 DIAGNOSIS — E1122 Type 2 diabetes mellitus with diabetic chronic kidney disease: Secondary | ICD-10-CM | POA: Diagnosis not present

## 2020-09-07 DIAGNOSIS — F329 Major depressive disorder, single episode, unspecified: Secondary | ICD-10-CM

## 2020-09-07 DIAGNOSIS — M25552 Pain in left hip: Secondary | ICD-10-CM

## 2020-09-07 DIAGNOSIS — R296 Repeated falls: Secondary | ICD-10-CM

## 2020-09-07 DIAGNOSIS — N3281 Overactive bladder: Secondary | ICD-10-CM

## 2020-09-07 DIAGNOSIS — N183 Chronic kidney disease, stage 3 unspecified: Secondary | ICD-10-CM

## 2020-09-07 NOTE — Progress Notes (Signed)
Location:  [place] Room Number: [place] of Service:  SNF (31) Provider:  Durenda Age, DNP, FNP-BC  Patient Care Team: Donnie Coffin, MD as PCP - General (Family Medicine) Minna Merritts, MD as PCP - Cardiology (Cardiology) Vickie Epley, MD as PCP - Electrophysiology (Cardiology) Minna Merritts, MD as Consulting Physician (Cardiology) Gabriel Carina Betsey Holiday, MD as Physician Assistant (Endocrinology)  Extended Emergency Contact Information Primary Emergency Contact: Pryor, Plummer Phone: [phone] Mobile Phone: [phone] Relation: Sister  Code Status:  Full Code  Goals of care: Advanced Directive information Advanced Directives 08/27/2020  Does Patient Have a Medical Advance Directive? No  Would patient like information on creating a medical advance directive? No - Patient declined  Some encounter information is confidential and restricted. Go to Review Flowsheets activity to see all data.     Chief Complaint  Patient presents with  . Discharge Note    Will discharge home on 09/08/20    HPI:  Pt is a 65 y.o. female who is for discharge home on 09/08/20 with Home health PT, OT, Nurse and Aide. Home health Nurse for CHF monitoring and medication management.  She was admitted to [facility] on 08/25/2020 post hospital admission 08/22/2020 to 08/25/2020.  She fell 5 times at home due to generalized weakness.  CT scan of the head negative.  CT scan of the cervical spine was negative for acute bony fracture but did show degenerative disc disease.  She was found to have acute kidney injury on chronic kidney disease stage II.  Creatinine was 1.31 on presentation in the ED and 1.43 upon discharge to rehab.  Lasix was initially given and then held.  Lasix was changed to as needed.  Her baseline creatinine around 0.93.  Of note, she was recently hospitalized 4/13 through 4/21 due to sepsis from catheter related UTI,  aspiration pneumonia and CHF exacerbation.  She was treated in the hospital with antibiotics and discharged on Augmentin.  Patient was admitted to this facility for short-term rehabilitation after the patient's recent hospitalization.  Patient has completed SNF rehabilitation and therapy has cleared the patient for discharge.   Past Medical History:  Diagnosis Date  . Anxiety   . Atrial fibrillation ([place])   . Atrial flutter ([place])   . Coronary artery disease    a. 06/2014 NSTEMI s/p CABG x 4 (LIMA to LAD, VG to Diag, VG to OM, VG to PDA).  . Depression   . Diabetic neuropathy ([place])   . GERD (gastroesophageal reflux disease)   . HTN (hypertension)   . Hyperlipidemia LDL goal <70   . IDDM (insulin dependent diabetes mellitus)   . Ischemic cardiomyopathy    a. 06/2014 Echo: EF 40-45%, HK of entire inferolateral and inferior myocardium c/w infarct of RCA/LCx, GR2DD, mild MR  . Myocardial infarction ([place]) 2016  . OA (osteoarthritis) of knee   . Obesity   . Osteomyelitis ([place])   . Peripheral neuropathy   . Peripheral vascular disease ([place])    a. 09/2016 Periph Angio: CTO R popliteal, CTO L prox/mid SFA->Med Rx; b. 05/2017 PTA: LSFA (Viabahn covered stent x 2), DEB to L Post Tibial; c. 06/2017 ABI: R 0.44, L 1.05.  . Tobacco abuse    Past Surgical History:  Procedure Laterality Date  . ABDOMINAL AORTOGRAM W/LOWER EXTREMITY N/A 10/27/2016   Procedure: Abdominal Aortogram w/Lower Extremity;  Surgeon: Nelva Bush, MD;  Location: [facility];  Service: Cardiovascular;  Laterality: N/A;  . AMPUTATION TOE Left 10/21/2015   Procedure: AMPUTATION TOE;  Surgeon: Sharlotte Alamo, DPM;  Location: [facility];  Service: Podiatry;  Laterality: Left;  . AMPUTATION TOE Left 05/15/2017   Procedure: AMPUTATION LEFT GREAT TOE;  Surgeon: Sharlotte Alamo, DPM;  Location: [facility];  Service: Podiatry;  Laterality: Left;  . AORTIC VALVE REPLACEMENT (AVR)/CORONARY ARTERY BYPASS GRAFTING (CABG)    . BREAST BIOPSY     . CARDIAC CATHETERIZATION  06/2014   95% stenosis mLAD, occlusion ostial OM1, 70% stenosis LCx, 95% stenosis mRCA, EF 45%.  Marland Kitchen CARDIOVERSION N/A 04/14/2020   Procedure: CARDIOVERSION with TEE;  Surgeon: Minna Merritts, MD;  Location: [facility];  Service: Cardiovascular;  Laterality: N/A;  . CARDIOVERSION N/A 05/28/2020   Procedure: CARDIOVERSION;  Surgeon: Minna Merritts, MD;  Location: [facility];  Service: Cardiovascular;  Laterality: N/A;  . CORONARY ARTERY BYPASS GRAFT N/A 06/08/2014   Procedure: CORONARY ARTERY BYPASS GRAFTING (CABG);  Surgeon: Grace Isaac, MD;  Location: [facility];  Service: Open Heart Surgery;  Laterality: N/A;  Times 4 using left internal mammary artery to LAD artery and endoscopically harvested bilateral saphenous vein to Obtuse Marginal, Diagonal and Posterior Descending coronary arteries.  . CT ABD W & PELVIS WO CM  06/2014   nl liver, gallbladder, spleen, mild diverticular changes, no bowel wall inflammation, appendix nl, no hernia, no other sig abnormalities  . GASTRIC ROUX-EN-Y N/A 02/09/2020   Procedure: LAPAROSCOPIC ROUX-EN-Y GASTRIC BYPASS WITH UPPER ENDOSCOPY;  Surgeon: Johnathan Hausen, MD;  Location: [facility];  Service: General;  Laterality: N/A;  . LOWER EXTREMITY ANGIOGRAPHY Left 05/23/2017   Procedure: LOWER EXTREMITY ANGIOGRAPHY;  Surgeon: Algernon Huxley, MD;  Location: [facility];  Service: Cardiovascular;  Laterality: Left;  . LOWER EXTREMITY ANGIOGRAPHY Left 05/28/2017   Procedure: LOWER EXTREMITY ANGIOGRAPHY;  Surgeon: Algernon Huxley, MD;  Location: [facility];  Service: Cardiovascular;  Laterality: Left;  . LOWER EXTREMITY ANGIOGRAPHY Left 12/16/2018   Procedure: LOWER EXTREMITY ANGIOGRAPHY;  Surgeon: Algernon Huxley, MD;  Location: [facility];  Service: Cardiovascular;  Laterality: Left;  . LOWER EXTREMITY ANGIOGRAPHY Right 12/23/2018   Procedure: LOWER EXTREMITY ANGIOGRAPHY;  Surgeon: Algernon Huxley, MD;  Location: [facility];  Service: Cardiovascular;  Laterality: Right;  . TEE WITHOUT CARDIOVERSION N/A 06/08/2014   Procedure: TRANSESOPHAGEAL ECHOCARDIOGRAM (TEE);  Surgeon: Grace Isaac, MD;  Location: [facility];  Service: Open Heart Surgery;  Laterality: N/A;  . TEE WITHOUT CARDIOVERSION N/A 04/14/2020   Procedure: TRANSESOPHAGEAL ECHOCARDIOGRAM (TEE);  Surgeon: Minna Merritts, MD;  Location: [facility];  Service: Cardiovascular;  Laterality: N/A;  . TOE AMPUTATION Right 12/2011   rt middle toe  . UPPER GI ENDOSCOPY N/A 02/09/2020   Procedure: UPPER GI ENDOSCOPY;  Surgeon: Johnathan Hausen, MD;  Location: [facility];  Service: General;  Laterality: N/A;  . US ECHOCARDIOGRAPHY  06/2014   EF 50-55%, HK of inf/post/inferolat walls, Ao sclerosis    No Known Allergies  Outpatient Encounter Medications as of 09/07/2020  Medication Sig  . ACCU-CHEK AVIVA PLUS test strip 1 each 3 (three) times daily. (Patient not taking: Reported on 08/27/2020)  . Accu-Chek Softclix Lancets lancets 1 each 3 (three) times daily. (Patient not taking: Reported on 08/27/2020)  . Alcohol Swabs (B-D SINGLE USE SWABS REGULAR) PADS Apply topically. (Patient not taking: Reported on 08/27/2020)  . amiodarone (PACERONE) 200 MG tablet Take 1 tablet (200 mg total) by mouth 2 (two)  times daily.  Marland Kitchen apixaban (ELIQUIS) 5 MG TABS tablet Take 1 tablet (5 mg total) by mouth 2 (two) times daily.  Marland Kitchen atorvastatin (LIPITOR) 80 MG tablet Take 1 tablet (80 mg total) by mouth daily at 6 PM.  . Cholecalciferol (VITAMIN D3) 50 MCG (2000 UT) TABS Take 2,000 Units by mouth daily.  . clotrimazole (LOTRIMIN) 1 % cream Apply  as directed to affected area twice a day  for fungal infection  . diltiazem (CARDIZEM) 60 MG tablet Take 1 tablet (60 mg total) by mouth 2 (two) times daily.  Marland Kitchen docusate sodium (COLACE) 100 MG capsule Take 100 mg by mouth 2 (two) times daily.  . DROPLET PEN NEEDLES 31G X 6 MM MISC   . DULoxetine (CYMBALTA) 60 MG capsule Take 1 capsule (60 mg total)  by mouth daily.  . Emollient (CERAVE EX) Apply 1 application topically in the morning and at bedtime.  . furosemide (LASIX) 20 MG tablet Take 2 tablets (40 mg total) by mouth daily as needed (fluid retention.).  Marland Kitchen insulin aspart (NOVOLOG) 100 UNIT/ML injection Inject 4 Units into the skin 3 (three) times daily with meals.  . insulin glargine (LANTUS) 100 UNIT/ML injection Inject 0.06 mLs (6 Units total) into the skin at bedtime.  . lidocaine (LIDODERM) 5 % Place 1 patch onto the skin daily as needed (PAIN.).   Marland Kitchen metoprolol tartrate (LOPRESSOR) 50 MG tablet Take 1 tablet (50 mg total) by mouth 2 (two) times daily.  . Multiple Vitamins-Minerals (MULTIVITAMIN WITH MINERALS) tablet Take 1 tablet by mouth in the morning and at bedtime.  . NON FORMULARY Med Pass Give 139ml po BID  . oxybutynin (DITROPAN) 5 MG tablet Take 5 mg by mouth 2 (two) times daily.   . pantoprazole (PROTONIX) 40 MG tablet Take 1 tablet (40 mg total) by mouth daily.  . potassium chloride SA (KLOR-CON) 20 MEQ tablet TAKE 1 TABLET (20 MEQ TOTAL) BY MOUTH DAILY AS NEEDED.  . pregabalin (LYRICA) 100 MG capsule Take 1 capsule (100 mg total) by mouth 3 (three) times daily.  Marland Kitchen tiZANidine (ZANAFLEX) 4 MG capsule Take 1 capsule (4 mg total) by mouth 3 (three) times daily as needed for muscle spasms.  . varenicline (CHANTIX) 1 MG tablet Take 1 mg by mouth daily. (Patient not taking: Reported on 09/02/2020)   No facility-administered encounter medications on file as of 09/07/2020.    Review of Systems  GENERAL: No change in appetite, no fatigue, no fever, chills MOUTH and THROAT: Denies oral discomfort, gingival pain or bleeding, pain from teeth or hoarseness   RESPIRATORY: no cough, SOB, DOE, wheezing, hemoptysis CARDIAC: No chest pain, edema or palpitations GI: No abdominal pain, diarrhea, constipation, heart burn, nausea or vomiting GU: Denies dysuria, frequency, hematuria, incontinence, or discharge NEUROLOGICAL: Denies dizziness,  syncope, numbness, or headache PSYCHIATRIC: Denies feelings of depression or anxiety. No report of hallucinations, insomnia, paranoia, or agitation   Immunization History  Administered Date(s) Administered  . Influenza,inj,Quad PF,6+ Mos 05/04/2014, 05/17/2017  . Influenza-Unspecified 02/01/2014  . Pneumococcal Polysaccharide-23 11/20/2013, 10/22/2015  . Pneumococcal-Unspecified 04/03/2014   Pertinent  Health Maintenance Due  Topic Date Due  . OPHTHALMOLOGY EXAM  Never done  . COLONOSCOPY (Pts 45-52yrs Insurance coverage will need to be confirmed)  Never done  . MAMMOGRAM  Never done  . FOOT EXAM  10/10/2017  . URINE MICROALBUMIN  10/10/2017  . DEXA SCAN  Never done  . PNA vac Low Risk Adult (1 of 2 - PCV13) 10/16/2018  .  HEMOGLOBIN A1C  11/23/2020  . INFLUENZA VACCINE  11/29/2020   Fall Risk  04/16/2020 02/03/2019 11/17/2014  Falls in the past year? 0 0 Yes  Number falls in past yr: - - 1  Injury with Fall? - - No  Risk for fall due to : - - History of fall(s);Medication side effect  Follow up - - Education provided;Falls prevention discussed     Vitals:   09/07/20 1000  BP: 106/71  Pulse: 92  Resp: 16  Temp: (!) 97.2 F (36.2 C)  Weight: 246 lb 9.6 oz (111.9 kg)  Height: 5\' 8"  (1.727 m)   Body mass index is 37.5 kg/m.  Physical Exam  GENERAL APPEARANCE: Well nourished. In no acute distress. Obese. SKIN:  Left foot wound with dressing MOUTH and THROAT: Lips are without lesions. Oral mucosa is moist and without lesions.  RESPIRATORY: Breathing is even & unlabored, BS CTAB CARDIAC: RRR, no murmur,no extra heart sounds GI: Abdomen soft, normal BS, no masses, no tenderness EXTREMITIES:  Able to move X 4 extremities NEUROLOGICAL: There is no tremor. Speech is clear. Alert and oriented X 3. PSYCHIATRIC:  Affect and behavior are appropriate  Labs reviewed: Recent Labs    03/02/20 1250 04/13/20 0715 08/12/20 0411 08/13/20 0408 08/16/20 5697 08/16/20 0900  08/22/20 0858 08/23/20 0609 08/23/20 0952 08/24/20 0322 08/25/20 0623  NA 137   < > 140   < > 141   < > 137 137  --  135 138  K 4.9   < > 3.5   < > 3.8   < > 3.9 3.9  --  4.3 4.0  CL 105   < > 101   < > 99   < > 98 99  --  100 103  CO2 22   < > 28   < > 31   < > 28 28  --  27 26  GLUCOSE 166*   < > 166*   < > 252*   < > 164* 190*  --  304* 208*  BUN 49*   < > 16   < > 17   < > 17 14  --  16 21  CREATININE 1.53*   < > 1.40*   < > 1.06*   < > 1.31* 1.06*  --  1.44* 1.43*  CALCIUM 9.2   < > 8.9   < > 9.0   < > 8.5* 8.1*  --  8.0* 8.4*  MG  --    < > 1.8  --  1.9  --  1.7  --  1.7  --   --   PHOS 2.9  --  3.4  --   --   --   --   --   --   --   --    < > = values in this interval not displayed.   Recent Labs    08/17/20 0410 08/22/20 0858 08/24/20 0322  AST 30 50* 33  ALT 31 46* 33  ALKPHOS 131* 108 100  BILITOT 1.1 1.1 0.6  PROT 6.7 6.8 5.5*  ALBUMIN 2.4* 2.4* 2.0*   Recent Labs    08/12/20 0411 08/13/20 0408 08/19/20 0735 08/22/20 0858 08/23/20 0952 08/24/20 0322 08/25/20 0623  WBC 13.1*   < > 13.4* 10.1 9.4 9.3 8.4  NEUTROABS 11.1*  --  11.0* 7.1  --   --   --   HGB 13.6   < > 12.8 13.2 13.8 11.5* 12.0  HCT  39.4   < > 38.0 39.7 41.6 35.5* 35.1*  MCV 74.1*   < > 73.1* 73.7* 75.6* 76.3* 74.4*  PLT 331   < > 323 388 453* 391 408*   < > = values in this interval not displayed.   Lab Results  Component Value Date   TSH 2.314 08/23/2020   Lab Results  Component Value Date   HGBA1C 8.0 (H) 05/26/2020   Lab Results  Component Value Date   CHOL 74 04/13/2020   HDL 33 (L) 04/13/2020   LDLCALC 23 04/13/2020   LDLDIRECT 27.8 04/13/2020   TRIG 89 04/13/2020   CHOLHDL 2.2 04/13/2020    Significant Diagnostic Results in last 30 days:  CT Head Wo Contrast  Result Date: 08/22/2020 CLINICAL DATA:  Multiple falls. On anticoagulation. Head trauma. Left-sided neck pain. EXAM: CT HEAD WITHOUT CONTRAST CT CERVICAL SPINE WITHOUT CONTRAST TECHNIQUE: Multidetector CT imaging  of the head and cervical spine was performed following the standard protocol without intravenous contrast. Multiplanar CT image reconstructions of the cervical spine were also generated. COMPARISON:  08/11/2020, 08/16/2020 FINDINGS: CT HEAD FINDINGS Brain: No evidence of acute infarction, hemorrhage, hydrocephalus, extra-axial collection or mass lesion/mass effect. Scattered low-density changes within the periventricular and subcortical white matter compatible with chronic microvascular ischemic change. Vascular: Atherosclerotic calcifications involving the large vessels of the skull base. No unexpected hyperdense vessel. Skull: Normal. Negative for fracture or focal lesion. Sinuses/Orbits: No acute finding. Other: Negative for scalp hematoma CT CERVICAL SPINE FINDINGS Alignment: Facet joints are aligned without dislocation or traumatic listhesis. Dens and lateral masses are aligned. Skull base and vertebrae: No acute fracture. No primary bone lesion or focal pathologic process. Soft tissues and spinal canal: No prevertebral fluid or swelling. No visible canal hematoma. Disc levels: Moderate multilevel cervical spondylosis, which appears most pronounced at the C3-4 through C5-6 levels. No progression from prior. Upper chest: Included lung apices are clear Other: Degenerative changes of the bilateral sternoclavicular joints. Bilateral carotid atherosclerosis. IMPRESSION: 1. No acute intracranial findings. 2. No evidence of acute fracture or traumatic listhesis of the cervical spine. 3. Moderate multilevel cervical spondylosis, most pronounced at the C3-4 through C5-6 levels. Electronically Signed   By: Davina Poke D.O.   On: 08/22/2020 09:20   CT HEAD WO CONTRAST  Result Date: 08/16/2020 CLINICAL DATA:  Altered mental status. EXAM: CT HEAD WITHOUT CONTRAST TECHNIQUE: Contiguous axial images were obtained from the base of the skull through the vertex without intravenous contrast. Exam is limited due to  patient motion artifact. COMPARISON:  August 11, 2020. FINDINGS: Brain: Limited exam due to patient motion artifact. No mass effect or midline shift is noted. Ventricular size is within normal limits. There is no definite evidence of mass lesion, hemorrhage or acute infarction. Vascular: No hyperdense vessel or unexpected calcification. Skull: Normal. Negative for fracture or focal lesion. Sinuses/Orbits: No acute finding. Other: None. IMPRESSION: Significantly limited exam due to persistent patient motion artifact. No definite evidence of acute intracranial abnormality is noted. Electronically Signed   By: Marijo Conception M.D.   On: 08/16/2020 10:00   CT Head Wo Contrast  Result Date: 08/11/2020 CLINICAL DATA:  Neck injury.  Fall. EXAM: CT HEAD WITHOUT CONTRAST CT CERVICAL SPINE WITHOUT CONTRAST TECHNIQUE: Multidetector CT imaging of the head and cervical spine was performed following the standard protocol without intravenous contrast. Multiplanar CT image reconstructions of the cervical spine were also generated. COMPARISON:  None. FINDINGS: CT HEAD FINDINGS Brain: No evidence of acute infarction, hemorrhage,  hydrocephalus, extra-axial collection or mass lesion/mass effect. Vascular: No hyperdense vessel or unexpected calcification. Skull: Normal. Negative for fracture or focal lesion. Sinuses/Orbits: No acute finding. Other: None. CT CERVICAL SPINE FINDINGS Alignment: Normal. Skull base and vertebrae: No acute fracture. No primary bone lesion or focal pathologic process. Soft tissues and spinal canal: No prevertebral fluid or swelling. No visible canal hematoma. Disc levels: Moderate degenerative disc disease is noted at C3-4, C5-6 and C6-7 with anterior osteophyte formation. Other: None. IMPRESSION: Normal head CT. Moderate multilevel degenerative disc disease. No acute abnormality seen in the cervical spine. Electronically Signed   By: Marijo Conception M.D.   On: 08/11/2020 16:34   CT Angio Chest PE W  and/or Wo Contrast  Result Date: 08/11/2020 CLINICAL DATA:  Shortness of breath EXAM: CT ANGIOGRAPHY CHEST WITH CONTRAST TECHNIQUE: Multidetector CT imaging of the chest was performed using the standard protocol during bolus administration of intravenous contrast. Multiplanar CT image reconstructions and MIPs were obtained to evaluate the vascular anatomy. CONTRAST:  2mL OMNIPAQUE IOHEXOL 350 MG/ML SOLN COMPARISON:  None. FINDINGS: Cardiovascular: Cardiomegaly. Prior CABG. Aortic atherosclerosis. No aneurysm. No filling defects in the pulmonary arteries to suggest pulmonary emboli. Mediastinum/Nodes: Borderline sized mediastinal lymph nodes diffusely as well as bilateral hilar lymph nodes, likely reactive. Trachea and esophagus are unremarkable. Thyroid unremarkable. Lungs/Pleura: Extensive bilateral airspace disease and small to moderate bilateral effusions. Findings concerning for multifocal pneumonia. Upper Abdomen: Imaging into the upper abdomen demonstrates no acute findings. Musculoskeletal: Chest wall soft tissues are unremarkable. No acute bony abnormality. Review of the MIP images confirms the above findings. IMPRESSION: Extensive bilateral airspace disease with small to moderate bilateral pleural effusions. Findings concerning for multifocal pneumonia. No evidence of pulmonary embolus. Cardiomegaly. Aortic Atherosclerosis (ICD10-I70.0). Electronically Signed   By: Rolm Baptise M.D.   On: 08/11/2020 16:36   CT Cervical Spine Wo Contrast  Result Date: 08/22/2020 CLINICAL DATA:  Multiple falls. On anticoagulation. Head trauma. Left-sided neck pain. EXAM: CT HEAD WITHOUT CONTRAST CT CERVICAL SPINE WITHOUT CONTRAST TECHNIQUE: Multidetector CT imaging of the head and cervical spine was performed following the standard protocol without intravenous contrast. Multiplanar CT image reconstructions of the cervical spine were also generated. COMPARISON:  08/11/2020, 08/16/2020 FINDINGS: CT HEAD FINDINGS Brain:  No evidence of acute infarction, hemorrhage, hydrocephalus, extra-axial collection or mass lesion/mass effect. Scattered low-density changes within the periventricular and subcortical white matter compatible with chronic microvascular ischemic change. Vascular: Atherosclerotic calcifications involving the large vessels of the skull base. No unexpected hyperdense vessel. Skull: Normal. Negative for fracture or focal lesion. Sinuses/Orbits: No acute finding. Other: Negative for scalp hematoma CT CERVICAL SPINE FINDINGS Alignment: Facet joints are aligned without dislocation or traumatic listhesis. Dens and lateral masses are aligned. Skull base and vertebrae: No acute fracture. No primary bone lesion or focal pathologic process. Soft tissues and spinal canal: No prevertebral fluid or swelling. No visible canal hematoma. Disc levels: Moderate multilevel cervical spondylosis, which appears most pronounced at the C3-4 through C5-6 levels. No progression from prior. Upper chest: Included lung apices are clear Other: Degenerative changes of the bilateral sternoclavicular joints. Bilateral carotid atherosclerosis. IMPRESSION: 1. No acute intracranial findings. 2. No evidence of acute fracture or traumatic listhesis of the cervical spine. 3. Moderate multilevel cervical spondylosis, most pronounced at the C3-4 through C5-6 levels. Electronically Signed   By: Davina Poke D.O.   On: 08/22/2020 09:20   CT Cervical Spine Wo Contrast  Result Date: 08/11/2020 CLINICAL DATA:  Neck injury.  Fall. EXAM:  CT HEAD WITHOUT CONTRAST CT CERVICAL SPINE WITHOUT CONTRAST TECHNIQUE: Multidetector CT imaging of the head and cervical spine was performed following the standard protocol without intravenous contrast. Multiplanar CT image reconstructions of the cervical spine were also generated. COMPARISON:  None. FINDINGS: CT HEAD FINDINGS Brain: No evidence of acute infarction, hemorrhage, hydrocephalus, extra-axial collection or mass  lesion/mass effect. Vascular: No hyperdense vessel or unexpected calcification. Skull: Normal. Negative for fracture or focal lesion. Sinuses/Orbits: No acute finding. Other: None. CT CERVICAL SPINE FINDINGS Alignment: Normal. Skull base and vertebrae: No acute fracture. No primary bone lesion or focal pathologic process. Soft tissues and spinal canal: No prevertebral fluid or swelling. No visible canal hematoma. Disc levels: Moderate degenerative disc disease is noted at C3-4, C5-6 and C6-7 with anterior osteophyte formation. Other: None. IMPRESSION: Normal head CT. Moderate multilevel degenerative disc disease. No acute abnormality seen in the cervical spine. Electronically Signed   By: Marijo Conception M.D.   On: 08/11/2020 16:34   CT ABDOMEN PELVIS W CONTRAST  Result Date: 08/11/2020 CLINICAL DATA:  Abdominal distention EXAM: CT ABDOMEN AND PELVIS WITH CONTRAST TECHNIQUE: Multidetector CT imaging of the abdomen and pelvis was performed using the standard protocol following bolus administration of intravenous contrast. CONTRAST:  11mL OMNIPAQUE IOHEXOL 350 MG/ML SOLN COMPARISON:  09/06/2015 FINDINGS: Lower chest: Bilateral pleural effusions with bilateral lower lobe airspace disease. See further discussion on chest CT. Hepatobiliary: No focal hepatic abnormality. Gallbladder unremarkable. Pancreas: No focal abnormality or ductal dilatation. Spleen: No focal abnormality.  Normal size. Adrenals/Urinary Tract: Fullness of the adrenal glands bilaterally, likely hyperplasia. No renal mass or hydronephrosis. Urinary bladder unremarkable. Stomach/Bowel: Sigmoid diverticulosis. No active diverticulitis. Scattered right colonic diverticulosis. Stomach and small bowel decompressed, unremarkable. Prior gastric bypass. Vascular/Lymphatic: Diffuse aortoiliac atherosclerosis. No evidence of aneurysm or adenopathy. Reproductive: No mass. Other: No free fluid or free air. Musculoskeletal: No acute bony abnormality.  IMPRESSION: Bilateral pleural effusions with lower lobe airspace disease. See further discussion on chest CT report. Scattered colonic diverticulosis. No active diverticulitis. Aortoiliac atherosclerosis. No acute findings in the abdomen or pelvis. Electronically Signed   By: Rolm Baptise M.D.   On: 08/11/2020 16:38   DG Chest Portable 1 View  Result Date: 08/22/2020 CLINICAL DATA:  Recurrent falls. Coronary artery disease. Left hip pain. EXAM: PORTABLE CHEST 1 VIEW COMPARISON:  08/15/2020 FINDINGS: Heart size is within normal limits. Prior CABG again noted. No evidence of pulmonary infiltrate or edema. No evidence of pleural effusion. IMPRESSION: No active disease. Electronically Signed   By: Marlaine Hind M.D.   On: 08/22/2020 09:48   DG Chest Port 1 View  Result Date: 08/15/2020 CLINICAL DATA:  Shortness of breath EXAM: PORTABLE CHEST 1 VIEW COMPARISON:  08/11/2020 FINDINGS: Cardiomegaly. Prior CABG. Improving bilateral airspace disease since previous study. Mild residual lower lobe airspace opacities. No effusions or acute bony abnormality. IMPRESSION: Improving bilateral airspace disease with residual lower lobe airspace opacities. Electronically Signed   By: Rolm Baptise M.D.   On: 08/15/2020 03:23   DG Chest Portable 1 View  Result Date: 08/11/2020 CLINICAL DATA:  Short of breath.  Hypoxia. EXAM: PORTABLE CHEST 1 VIEW COMPARISON:  07/01/2018. FINDINGS: Patchy bilateral airspace lung opacities are noted, most evident peripherally and at the bases. Possible small pleural effusions.  No pneumothorax. 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 Cardiac silhouette is mildly enlarged. No convincing mediastinal or hilar masses. Skeletal structures are grossly intact. IMPRESSION: 1. Bilateral airspace lung opacities, which may reflect multifocal pneumonia. Pulmonary edema is  possible, supported by possible small effusions and mild cardiomegaly. Electronically Signed   By: Lajean Manes M.D.   On: 08/11/2020  14:23   ECHOCARDIOGRAM COMPLETE  Result Date: 08/12/2020    ECHOCARDIOGRAM REPORT   Patient Name:   [facility] Date of Exam: 08/12/2020 Medical Rec #:  [id number]          Height:       68.5 in Accession #:    [id number]         Weight:       249.1 lb Date of Birth:  [birth year]          BSA:          2.256 m Patient Age:    89 years           BP:           141/67 mmHg Patient Gender: F                  HR:           95 bpm. Exam Location:  [facility] Procedure: 2D Echo, Color Doppler, Cardiac Doppler and Strain Analysis Indications:     O17.51 CHF-Acute Systolic  History:         Patient has prior history of Echocardiogram examinations, most                  recent 04/14/2020. Ischemic Cardiomyopathy, Previous Myocardial                  Infarction and CAD, Prior CABG, PVD; Risk Factors:Current                  Smoker, Hypertension, Diabetes and Dyslipidemia.  Sonographer:     Charmayne Sheer RDCS (AE) Referring Phys:  [id number] [place] Diagnosing Phys: Ida Rogue MD  Sonographer Comments: Global longitudinal strain was attempted. IMPRESSIONS  1. Left ventricular ejection fraction, by estimation, is 30 to 35%. The left ventricle has moderately decreased function. The left ventricle demonstrates global hypokinesis. Left ventricular diastolic parameters are indeterminate. The average left ventricular global longitudinal strain is -12.5 %. The global longitudinal strain is abnormal.  2. Right ventricular systolic function is normal. The right ventricular size is normal. There is normal pulmonary artery systolic pressure.  3. Left atrial size was moderately dilated.  4. The mitral valve is normal in structure. Mild mitral valve regurgitation. FINDINGS  Left Ventricle: Left ventricular ejection fraction, by estimation, is 30 to 35%. The left ventricle has moderately decreased function. The left ventricle demonstrates global hypokinesis. The average left ventricular global longitudinal strain is -12.5 %. The global  longitudinal strain is abnormal. The left ventricular internal cavity size was normal in size. There is no left ventricular hypertrophy. Left ventricular diastolic parameters are indeterminate. Right Ventricle: The right ventricular size is normal. No increase in right ventricular wall thickness. Right ventricular systolic function is normal. There is normal pulmonary artery systolic pressure. The tricuspid regurgitant velocity is 2.06 m/s, and  with an assumed right atrial pressure of 5 mmHg, the estimated right ventricular systolic pressure is 78.2 mmHg. Left Atrium: Left atrial size was moderately dilated. Right Atrium: Right atrial size was normal in size. Pericardium: There is no evidence of pericardial effusion. Mitral Valve: The mitral valve is normal in structure. Mild mitral valve regurgitation. No evidence of mitral valve stenosis. MV peak gradient, 6.5 mmHg. The mean mitral valve gradient is 4.0 mmHg. Tricuspid Valve: The tricuspid valve is normal  in structure. Tricuspid valve regurgitation is mild . No evidence of tricuspid stenosis. Aortic Valve: The aortic valve is normal in structure. Aortic valve regurgitation is not visualized. Mild aortic valve sclerosis is present, with no evidence of aortic valve stenosis. Aortic valve mean gradient measures 4.0 mmHg. Aortic valve peak gradient measures 6.0 mmHg. Aortic valve area, by VTI measures 3.08 cm. Pulmonic Valve: The pulmonic valve was normal in structure. Pulmonic valve regurgitation is not visualized. No evidence of pulmonic stenosis. Aorta: The aortic root is normal in size and structure. Venous: The inferior vena cava is normal in size with greater than 50% respiratory variability, suggesting right atrial pressure of 3 mmHg. IAS/Shunts: No atrial level shunt detected by color flow Doppler.  LEFT VENTRICLE PLAX 2D LVIDd:         5.50 cm      Diastology LVIDs:         4.80 cm      LV e' medial:    8.16 cm/s LV PW:         1.10 cm      LV E/e' medial:   15.6 LV IVS:        1.10 cm      LV e' lateral:   9.36 cm/s LVOT diam:     2.30 cm      LV E/e' lateral: 13.6 LV SV:         69 LV SV Index:   31           2D Longitudinal Strain LVOT Area:     4.15 cm     2D Strain GLS Avg:     -12.5 %  LV Volumes (MOD) LV vol d, MOD A2C: 161.0 ml LV vol d, MOD A4C: 134.0 ml LV vol s, MOD A2C: 88.2 ml LV vol s, MOD A4C: 80.2 ml LV SV MOD A2C:     72.8 ml LV SV MOD A4C:     134.0 ml LV SV MOD BP:      63.1 ml RIGHT VENTRICLE RV Basal diam:  3.70 cm LEFT ATRIUM             Index       RIGHT ATRIUM           Index LA diam:        4.80 cm 2.13 cm/m  RA Area:     18.10 cm LA Vol (A2C):   81.2 ml 35.99 ml/m RA Volume:   48.20 ml  21.37 ml/m LA Vol (A4C):   81.0 ml 35.91 ml/m LA Biplane Vol: 84.1 ml 37.28 ml/m  AORTIC VALVE                   PULMONIC VALVE AV Area (Vmax):    3.18 cm    PV Vmax:       1.08 m/s AV Area (Vmean):   2.94 cm    PV Vmean:      78.400 cm/s AV Area (VTI):     3.08 cm    PV VTI:        0.209 m AV Vmax:           122.00 cm/s PV Peak grad:  4.7 mmHg AV Vmean:          91.900 cm/s PV Mean grad:  3.0 mmHg AV VTI:            0.224 m AV Peak Grad:      6.0 mmHg AV Mean Grad:  4.0 mmHg LVOT Vmax:         93.30 cm/s LVOT Vmean:        65.000 cm/s LVOT VTI:          0.166 m LVOT/AV VTI ratio: 0.74  AORTA Ao Root diam: 3.30 cm MITRAL VALVE                TRICUSPID VALVE MV Area (PHT): 4.85 cm     TR Peak grad:   17.0 mmHg MV Area VTI:   3.06 cm     TR Vmax:        206.00 cm/s MV Peak grad:  6.5 mmHg MV Mean grad:  4.0 mmHg     SHUNTS MV Vmax:       1.27 m/s     Systemic VTI:  0.17 m MV Vmean:      94.5 cm/s    Systemic Diam: 2.30 cm MV Decel Time: 157 msec MV E velocity: 127.00 cm/s Ida Rogue MD Electronically signed by Ida Rogue MD Signature Date/Time: 08/12/2020/10:32:58 AM    Final    DG Hip Unilat W or Wo Pelvis 2-3 Views Left  Result Date: 08/22/2020 CLINICAL DATA:  Recurrent fall.  Left hip pain. EXAM: DG HIP (WITH OR WITHOUT PELVIS) 2-3V  LEFT COMPARISON:  None. FINDINGS: Stable chronic deformity of the right hip. No signs of acute left hip fracture or dislocation. Bilateral hip osteoarthritis. Extensive vascular calcifications noted. IMPRESSION: 1. No acute findings. 2. Chronic deformity of the right hip. Electronically Signed   By: Kerby Moors M.D.   On: 08/22/2020 09:46    Assessment/Plan  1. Recurrent falls -For home health PT, OT, for therapeutic and strengthening exercises -   Fall precautions  2. Type 2 DM with CKD stage 3 and hypertension (HCC) Lab Results  Component Value Date   HGBA1C 8.0 (H) 05/26/2020   -   Continue Lantus 6 units SQ at bedtime and NovoLog 4 units SQ TID  3. Paroxysmal atrial fibrillation (HCC) -   Rate controlled, continue Eliquis 5 mg twice a day for anticoagulation, metoprolol tartrate 50 mg twice a day, amiodarone 200 twice a day and diltiazem 60 mg twice a day for rate control  4. AKI (acute kidney injury) ([place]) -   GFR 61.07 and creatinine 1.09 -   improved  5. Chronic systolic CHF (congestive heart failure) (HCC) -   Continue Lasix 20 mg daily PRN and -    Home health nurse to monitor and medication management  6. Left hip pain -   Continue Lyrica 100 mg 3 times a day and Zanaflex 4 mg 1 tab TID PRN -    Continue lidocaine 5% 1 patch topically daily PRN  7. OAB (overactive bladder) -   Continue oxybutynin 5 mg twice a day  8. Major depression, chronic -   Continue Cymbalta 60 mg 1 capsule daily    I have filled out patient's discharge paperwork and e-prescribed medications.  Patient will have home health PT, OT, ST, Nurse and Aide.  DME provided:  3-in-1  Total discharge time: Greater than 30 minutes Greater than 50% was spent in counseling and coordination of care.   Discharge time involved coordination of the discharge process with social worker, nursing staff and therapy department. Medical justification for home health services/DME verified.   Durenda Age, DNP, MSN, FNP-BC [facility] [phone] (Monday-Friday 8:00 a.m. - 5:00 p.m.) [phone] (after hours)

## 2020-09-08 MED ORDER — PREGABALIN 100 MG PO CAPS: 100.0000 mg | ORAL_CAPSULE | Freq: Three times a day (TID) | ORAL | 0 refills | Status: AC

## 2020-09-09 ENCOUNTER — Ambulatory Visit: Payer: Medicare HMO | Admitting: Medical

## 2020-09-09 NOTE — Progress Notes (Deleted)
Cardiology Office Note:    Date:  09/09/2020   ID:  [name], DOB [birth year], MRN [id number]  PCP:  Donnie Coffin, MD  [facility] Cardiologist:  Ida Rogue, MD  [place] Electrophysiologist:  Vickie Epley, MD   Referring MD: Donnie Coffin, MD   Chief Complaint: Hospital follow-up  History of Present Illness:    Melinda [name] is a 65 y.o. female with a hx of CAD status post four-vessel CABG 06/2014, poorly controlled diabetes type 2, hypertension, tobacco use, obesity, anxiety, peripheral neuropathy, atrial flutter on Eliquis, PAD with prior interventions followed by Dr. Lazaro Arms.  Patient was admitted to [facility] for non-STEMI in 2016.  After ongoing cath that showed severe three-vessel disease she was transferred to [facility] for four-vessel CABG (LIMA to LAD, SVG to diagonal, SVG to OM, SVG to PDA).  Echo at that time showed LVEF 40 to 45%, severe hypokinesis inferior portion of the wall, grade 2 diastolic dysfunction, mild MR.  Echo November 21 showed EF 40% with grade 1 diastolic dysfunction  Patient had a IONGEXBM 8413 and went into a flutter at that time.  He has undergone 2 cardioversions, the most recent in May 28, 2020 performed on amiodarone.  She went back into a flutter after 3 days and was referred to EP.  She was set up for a flutter ablation.  Patient was admitted 4/13-4/21 with hypoxic respiratory failure, A. fib RVR, aspiration pneumonia/sepsis, acute on chronic CHF, bilateral oral effusions, AKI on CKD stage III abnormal LFTs.  Repeat echo showed EF 30 to 35%.  Patient was treated with IV diuretics (diuresed 16 L), IV antibiotics, and patient was discharged home.  Patient was admitted again for 4/24-4/27 for fall at home.  CT scan of the head was negative.  Was recommended to go to outpatient rehab.  Today   Past Medical History:  Diagnosis Date  . Anxiety   . Atrial fibrillation ([place])   . Atrial flutter ([place])   . Coronary artery  disease    a. 06/2014 NSTEMI s/p CABG x 4 (LIMA to LAD, VG to Diag, VG to OM, VG to PDA).  . Depression   . Diabetic neuropathy ([place])   . GERD (gastroesophageal reflux disease)   . HTN (hypertension)   . Hyperlipidemia LDL goal <70   . IDDM (insulin dependent diabetes mellitus)   . Ischemic cardiomyopathy    a. 06/2014 Echo: EF 40-45%, HK of entire inferolateral and inferior myocardium c/w infarct of RCA/LCx, GR2DD, mild MR  . Myocardial infarction ([place]) 2016  . OA (osteoarthritis) of knee   . Obesity   . Osteomyelitis ([place])   . Peripheral neuropathy   . Peripheral vascular disease ([place])    a. 09/2016 Periph Angio: CTO R popliteal, CTO L prox/mid SFA->Med Rx; b. 05/2017 PTA: LSFA (Viabahn covered stent x 2), DEB to L Post Tibial; c. 06/2017 ABI: R 0.44, L 1.05.  . Tobacco abuse     Past Surgical History:  Procedure Laterality Date  . ABDOMINAL AORTOGRAM W/LOWER EXTREMITY N/A 10/27/2016   Procedure: Abdominal Aortogram w/Lower Extremity;  Surgeon: Nelva Bush, MD;  Location: [place] [facility];  Service: Cardiovascular;  Laterality: N/A;  . AMPUTATION TOE Left 10/21/2015   Procedure: AMPUTATION TOE;  Surgeon: Sharlotte Alamo, DPM;  Location: [facility];  Service: Podiatry;  Laterality: Left;  . AMPUTATION TOE Left 05/15/2017   Procedure: AMPUTATION LEFT GREAT TOE;  Surgeon: Sharlotte Alamo, DPM;  Location: [facility];  Service:  Podiatry;  Laterality: Left;  . AORTIC VALVE REPLACEMENT (AVR)/CORONARY ARTERY BYPASS GRAFTING (CABG)    . BREAST BIOPSY    . CARDIAC CATHETERIZATION  06/2014   95% stenosis mLAD, occlusion ostial OM1, 70% stenosis LCx, 95% stenosis mRCA, EF 45%.  Marland Kitchen CARDIOVERSION N/A 04/14/2020   Procedure: CARDIOVERSION with TEE;  Surgeon: Minna Merritts, MD;  Location: [facility];  Service: Cardiovascular;  Laterality: N/A;  . CARDIOVERSION N/A 05/28/2020   Procedure: CARDIOVERSION;  Surgeon: Minna Merritts, MD;  Location: [facility];  Service: Cardiovascular;  Laterality: N/A;  .  CORONARY ARTERY BYPASS GRAFT N/A 06/08/2014   Procedure: CORONARY ARTERY BYPASS GRAFTING (CABG);  Surgeon: Grace Isaac, MD;  Location: [place];  Service: Open Heart Surgery;  Laterality: N/A;  Times 4 using left internal mammary artery to LAD artery and endoscopically harvested bilateral saphenous vein to Obtuse Marginal, Diagonal and Posterior Descending coronary arteries.  . CT ABD W & PELVIS WO CM  06/2014   nl liver, gallbladder, spleen, mild diverticular changes, no bowel wall inflammation, appendix nl, no hernia, no other sig abnormalities  . GASTRIC ROUX-EN-Y N/A 02/09/2020   Procedure: LAPAROSCOPIC ROUX-EN-Y GASTRIC BYPASS WITH UPPER ENDOSCOPY;  Surgeon: Johnathan Hausen, MD;  Location: [facility];  Service: General;  Laterality: N/A;  . LOWER EXTREMITY ANGIOGRAPHY Left 05/23/2017   Procedure: LOWER EXTREMITY ANGIOGRAPHY;  Surgeon: Algernon Huxley, MD;  Location: [place] [facility];  Service: Cardiovascular;  Laterality: Left;  . LOWER EXTREMITY ANGIOGRAPHY Left 05/28/2017   Procedure: LOWER EXTREMITY ANGIOGRAPHY;  Surgeon: Algernon Huxley, MD;  Location: [place] [facility];  Service: Cardiovascular;  Laterality: Left;  . LOWER EXTREMITY ANGIOGRAPHY Left 12/16/2018   Procedure: LOWER EXTREMITY ANGIOGRAPHY;  Surgeon: Algernon Huxley, MD;  Location: [place] [facility];  Service: Cardiovascular;  Laterality: Left;  . LOWER EXTREMITY ANGIOGRAPHY Right 12/23/2018   Procedure: LOWER EXTREMITY ANGIOGRAPHY;  Surgeon: Algernon Huxley, MD;  Location: [place] [facility];  Service: Cardiovascular;  Laterality: Right;  . TEE WITHOUT CARDIOVERSION N/A 06/08/2014   Procedure: TRANSESOPHAGEAL ECHOCARDIOGRAM (TEE);  Surgeon: Grace Isaac, MD;  Location: [place];  Service: Open Heart Surgery;  Laterality: N/A;  . TEE WITHOUT CARDIOVERSION N/A 04/14/2020   Procedure: TRANSESOPHAGEAL ECHOCARDIOGRAM (TEE);  Surgeon: Minna Merritts, MD;  Location: [facility];  Service: Cardiovascular;  Laterality: N/A;  . TOE AMPUTATION  Right 12/2011   rt middle toe  . UPPER GI ENDOSCOPY N/A 02/09/2020   Procedure: UPPER GI ENDOSCOPY;  Surgeon: Johnathan Hausen, MD;  Location: [facility];  Service: General;  Laterality: N/A;  . US ECHOCARDIOGRAPHY  06/2014   EF 50-55%, HK of inf/post/inferolat walls, Ao sclerosis    Current Medications: No outpatient medications have been marked as taking for the 09/09/20 encounter (Appointment) with Kathlen Mody, Mcdonald Reiling H, [name].     Allergies:   Patient has no known allergies.   Social History   Socioeconomic History  . Marital status: Widowed    Spouse name: Not on file  . Number of children: Not on file  . Years of education: Not on file  . Highest education level: Not on file  Occupational History  . Not on file  Tobacco Use  . Smoking status: Former Smoker    Packs/day: 0.50    Years: 40.00    Pack years: 20.00    Types: Cigarettes  . Smokeless tobacco: Never Used  . Tobacco comment: started Chantix 04/12/20  Vaping Use  . Vaping Use: Never used  Substance and Sexual Activity  . Alcohol use: No    Alcohol/week: 0.0 standard drinks  . Drug use: Not Currently    Comment: crack  . Sexual activity: Not on file  Other Topics Concern  . Not on file  Social History Narrative  . Not on file   Social Determinants of Health   Financial Resource Strain: Not on file  Food Insecurity: Not on file  Transportation Needs: Not on file  Physical Activity: Not on file  Stress: Not on file  Social Connections: Not on file     Family History: The patient's *family history includes Asthma in an other family member; CAD in her father and mother; Diabetes in her father and mother; Hypertension in her mother; Sickle cell trait in an other family member.  ROS:   Please see the history of present illness.     All other systems reviewed and are negative.  EKGs/Labs/Other Studies Reviewed:    The following studies were reviewed today:  Echo 08/12/20 1. Left ventricular ejection  fraction, by estimation, is 30 to 35%. The  left ventricle has moderately decreased function. The left ventricle  demonstrates global hypokinesis. Left ventricular diastolic parameters are  indeterminate. The average left  ventricular global longitudinal strain is -12.5 %. The global longitudinal  strain is abnormal.  2. Right ventricular systolic function is normal. The right ventricular  size is normal. There is normal pulmonary artery systolic pressure.  3. Left atrial size was moderately dilated.  4. The mitral valve is normal in structure. Mild mitral valve  regurgitation.   Echo TEE 04/14/20 1. Left ventricular ejection fraction, by estimation, is 50 to 55%. The  left ventricle has low normal function. The left ventricle has no regional  wall motion abnormalities.  2. Right ventricular systolic function is normal. The right ventricular  size is normal.  3. No left atrial/left atrial appendage thrombus was detected.  4. The mitral valve is normal in structure. Mild to moderate mitral valve  regurgitation. No evidence of mitral stenosis.  5. The aortic valve is normal in structure. Aortic valve regurgitation is  not visualized. No aortic stenosis is present.  6. There is mild (Grade II) plaque involving the transverse aorta and  descending aorta.  7. The inferior vena cava is normal in size with greater than 50%  respiratory variability, suggesting right atrial pressure of 3 mmHg.  8. Agitated saline contrast bubble study was negative, with no evidence  of any interatrial shunt.   Conclusion(s)/Recommendation(s): Normal biventricular function without  evidence of hemodynamically significant valvular heart disease.    Echo 03/02/20 1. Left ventricular ejection fraction, by estimation, is 35 to 40%. The  left ventricle has moderately decreased function. The left ventricle  demonstrates regional wall motion abnormalities (see scoring  diagram/findings for  description). Left ventricular  diastolic parameters are consistent with Grade I diastolic dysfunction  (impaired relaxation). There is moderate hypokinesis of the left  ventricular, basal-mid inferior wall and inferolateral wall.  2. Right ventricular systolic function is mildly reduced. The right  ventricular size is normal. Tricuspid regurgitation signal is inadequate  for assessing PA pressure.  3. Left atrial size was mildly dilated.  4. Right atrial size was mildly dilated.  5. The mitral valve is normal in structure. Trivial mitral valve  regurgitation. No evidence of mitral stenosis. Moderate mitral annular  calcification.  6. The aortic valve is tricuspid. There is mild calcification of the  aortic valve. There is mild thickening of  the aortic valve. Aortic valve  regurgitation is not visualized. Mild to moderate aortic valve  sclerosis/calcification is present, without any  evidence of aortic stenosis.  7. The inferior vena cava is normal in size with <50% respiratory  variability, suggesting right atrial pressure of 8 mmHg.   EKG:  EKG is *** ordered today.  The ekg ordered today demonstrates ***  Recent Labs: 08/22/2020: B Natriuretic Peptide 99.8 08/23/2020: Magnesium 1.7; TSH 2.314 08/24/2020: ALT 33 08/25/2020: BUN 21; Creatinine, Ser 1.43; Hemoglobin 12.0; Platelets 408; Potassium 4.0; Sodium 138  Recent Lipid Panel    Component Value Date/Time   CHOL 74 04/13/2020 0715   CHOL 214 (H) 10/10/2016 1911   CHOL 94 06/02/2014 0405   TRIG 89 04/13/2020 0715   TRIG 122 06/02/2014 0405   HDL 33 (L) 04/13/2020 0715   HDL 42 10/10/2016 1911   HDL 39 (L) 06/02/2014 0405   CHOLHDL 2.2 04/13/2020 0715   VLDL 18 04/13/2020 0715   VLDL 24 06/02/2014 0405   LDLCALC 23 04/13/2020 0715   LDLCALC 103 (H) 10/10/2016 1911   LDLCALC 31 06/02/2014 0405   LDLDIRECT 27.8 04/13/2020 0715     Risk Assessment/Calculations:   {Does this patient have ATRIAL  FIBRILLATION?:[phone]}   Physical Exam:    VS:  There were no vitals taken for this visit.    Wt Readings from Last 3 Encounters:  09/07/20 246 lb 9.6 oz (111.9 kg)  09/06/20 246 lb 9.6 oz (111.9 kg)  08/27/20 233 lb 12.8 oz (106.1 kg)     GEN: *** Well nourished, well developed in no acute distress HEENT: Normal NECK: No JVD; No carotid bruits LYMPHATICS: No lymphadenopathy CARDIAC: ***RRR, no murmurs, rubs, gallops RESPIRATORY:  Clear to auscultation without rales, wheezing or rhonchi  ABDOMEN: Soft, non-tender, non-distended MUSCULOSKELETAL:  No edema; No deformity  SKIN: Warm and dry NEUROLOGIC:  Alert and oriented x 3 PSYCHIATRIC:  Normal affect   ASSESSMENT:    No diagnosis found. PLAN:    In order of problems listed above:  1. ***  Disposition: Follow up {follow up:15908} with ***   Shared Decision Making/Informed Consent   {Are you ordering a CV Procedure (e.g. stress test, cath, DCCV, TEE, etc)?   Press F2        :[id number]}    Signed, Ariel Dimitri Arlyss Repress  09/09/2020 7:37 AM    [facility]

## 2020-09-10 ENCOUNTER — Encounter: Payer: Self-pay | Admitting: Medical

## 2020-09-13 ENCOUNTER — Telehealth: Payer: Self-pay

## 2020-09-13 ENCOUNTER — Other Ambulatory Visit: Payer: Self-pay | Admitting: Cardiovascular Disease

## 2020-09-13 NOTE — Telephone Encounter (Signed)
Left message requesting call back.  Pt scheduled for atrial flutter ablation with cardiac CT prior.  Will need to cancel CT and reschedule for TEE if Pt still interested in ablation.  Await call back.

## 2020-09-13 NOTE — Telephone Encounter (Signed)
Left message for the pt to call back to organize her procedure plans now that protocol has changed.   Aflutter Ablation 09/23/20 TEE same day with arrival at 7:30 am.

## 2020-09-13 NOTE — Telephone Encounter (Signed)
-----   Message from Damian Leavell, RN sent at 09/13/2020  3:02 PM EDT ----- Regarding: needs to change to TEE prior to ablation I missed this one because she is actually a flutter  Her ablation is May 26 I think  She has been hospitalized since we saw her last, so I tried to call her just to confirm still wanted ablation and to change to TEE  She is a Public relations account executive Pt  [place]

## 2020-09-14 NOTE — Telephone Encounter (Signed)
Left another message for the pt to call back.  

## 2020-09-16 ENCOUNTER — Ambulatory Visit: Admission: RE | Admit: 2020-09-16 | Payer: Medicare HMO | Source: Ambulatory Visit

## 2020-09-16 NOTE — Telephone Encounter (Signed)
4 outreaches with messages left for Pt requesting call back to confirm ablation and change Pt from cardiac CT to TEE  Unable to reach Pt and no call back received.  Pt's ablation has been cancelled.  Will send letter to advise Pt to call office if she would like appointment to rediscuss.

## 2020-09-16 NOTE — Telephone Encounter (Signed)
Left another message for the pt to call back re: her Ablation and TEE. Detailed message re: needing a call back or we will have to cancel her procedure and reschedule at a later date.

## 2020-09-17 ENCOUNTER — Ambulatory Visit: Payer: Medicare HMO | Admitting: Physician Assistant

## 2020-09-23 ENCOUNTER — Encounter (HOSPITAL_COMMUNITY): Payer: Medicare HMO

## 2020-09-23 ENCOUNTER — Ambulatory Visit (HOSPITAL_COMMUNITY): Admit: 2020-09-23 | Payer: Medicare HMO | Admitting: Cardiology

## 2020-09-23 ENCOUNTER — Encounter (HOSPITAL_COMMUNITY): Payer: Self-pay

## 2020-09-23 SURGERY — ECHOCARDIOGRAM, TRANSESOPHAGEAL
Anesthesia: Moderate Sedation

## 2020-09-23 SURGERY — A-FLUTTER ABLATION
Anesthesia: General

## 2020-09-24 ENCOUNTER — Other Ambulatory Visit: Payer: Self-pay | Admitting: Cardiovascular Disease

## 2020-09-24 ENCOUNTER — Ambulatory Visit: Payer: Medicare HMO | Admitting: Physician Assistant

## 2020-09-24 DIAGNOSIS — I480 Paroxysmal atrial fibrillation: Secondary | ICD-10-CM

## 2020-09-24 NOTE — Telephone Encounter (Signed)
Patient returning call.

## 2020-09-24 NOTE — Telephone Encounter (Signed)
Please review for refill. There seems to be some problems with contacting the patient regarding a TEE and she is requesting a refill on the metoprolol. Please advise if okay to refill.

## 2020-09-25 ENCOUNTER — Other Ambulatory Visit: Payer: Self-pay

## 2020-09-25 ENCOUNTER — Emergency Department: Payer: Medicare HMO

## 2020-09-25 ENCOUNTER — Emergency Department
Admission: EM | Admit: 2020-09-25 | Discharge: 2020-09-25 | Disposition: A | Discharge status: Home / Self Care | Payer: Medicare HMO | Attending: Emergency Medicine | Admitting: Emergency Medicine

## 2020-09-25 DIAGNOSIS — I2510 Atherosclerotic heart disease of native coronary artery without angina pectoris: Secondary | ICD-10-CM | POA: Insufficient documentation

## 2020-09-25 DIAGNOSIS — N1831 Chronic kidney disease, stage 3a: Secondary | ICD-10-CM | POA: Diagnosis not present

## 2020-09-25 DIAGNOSIS — Z951 Presence of aortocoronary bypass graft: Secondary | ICD-10-CM | POA: Diagnosis not present

## 2020-09-25 DIAGNOSIS — W19XXXA Unspecified fall, initial encounter: Secondary | ICD-10-CM

## 2020-09-25 DIAGNOSIS — Z87891 Personal history of nicotine dependence: Secondary | ICD-10-CM | POA: Insufficient documentation

## 2020-09-25 DIAGNOSIS — E1122 Type 2 diabetes mellitus with diabetic chronic kidney disease: Secondary | ICD-10-CM | POA: Insufficient documentation

## 2020-09-25 DIAGNOSIS — S7002XA Contusion of left hip, initial encounter: Secondary | ICD-10-CM | POA: Diagnosis not present

## 2020-09-25 DIAGNOSIS — Z7901 Long term (current) use of anticoagulants: Secondary | ICD-10-CM | POA: Diagnosis not present

## 2020-09-25 DIAGNOSIS — W01198A Fall on same level from slipping, tripping and stumbling with subsequent striking against other object, initial encounter: Secondary | ICD-10-CM | POA: Diagnosis not present

## 2020-09-25 DIAGNOSIS — Z79899 Other long term (current) drug therapy: Secondary | ICD-10-CM | POA: Diagnosis not present

## 2020-09-25 DIAGNOSIS — Z794 Long term (current) use of insulin: Secondary | ICD-10-CM | POA: Insufficient documentation

## 2020-09-25 DIAGNOSIS — I5022 Chronic systolic (congestive) heart failure: Secondary | ICD-10-CM | POA: Insufficient documentation

## 2020-09-25 DIAGNOSIS — I130 Hypertensive heart and chronic kidney disease with heart failure and stage 1 through stage 4 chronic kidney disease, or unspecified chronic kidney disease: Secondary | ICD-10-CM | POA: Diagnosis not present

## 2020-09-25 DIAGNOSIS — E1140 Type 2 diabetes mellitus with diabetic neuropathy, unspecified: Secondary | ICD-10-CM | POA: Insufficient documentation

## 2020-09-25 DIAGNOSIS — S79912A Unspecified injury of left hip, initial encounter: Secondary | ICD-10-CM | POA: Diagnosis present

## 2020-09-25 DIAGNOSIS — I4891 Unspecified atrial fibrillation: Secondary | ICD-10-CM | POA: Diagnosis not present

## 2020-09-25 DIAGNOSIS — R52 Pain, unspecified: Secondary | ICD-10-CM

## 2020-09-25 NOTE — ED Provider Notes (Signed)
[facility] Emergency Department Provider Note  ____________________________________________   Event Date/Time   First MD Initiated Contact with Patient 09/25/20 0533     (approximate)  I have reviewed the triage vital signs and the nursing notes.   HISTORY  Chief Complaint Fall    HPI Melinda [name] is a 65 y.o. female with medical history as listed below who presents by EMS for evaluation of pain in her left hip after fall.  She said that she got up to go to the bathroom and tripped over her walker and landed on her left side, striking her hip on the floor.  She said that it hurts when she moves it but she is able to bear weight with some assistance.  She was able to ambulate to the toilet after arriving in the emergency department.  She uses a walker at baseline.  She did not lose consciousness and has no pain in her head, neck, chest, nor abdomen.  No nausea or vomiting.  No numbness or tingling or weakness in her extremities.        Past Medical History:  Diagnosis Date  . Anxiety   . Atrial fibrillation ([place])   . Atrial flutter ([place])   . Coronary artery disease    a. 06/2014 NSTEMI s/p CABG x 4 (LIMA to LAD, VG to Diag, VG to OM, VG to PDA).  . Depression   . Diabetic neuropathy ([place])   . GERD (gastroesophageal reflux disease)   . HTN (hypertension)   . Hyperlipidemia LDL goal <70   . IDDM (insulin dependent diabetes mellitus)   . Ischemic cardiomyopathy    a. 06/2014 Echo: EF 40-45%, HK of entire inferolateral and inferior myocardium c/w infarct of RCA/LCx, GR2DD, mild MR  . Myocardial infarction ([place]) 2016  . OA (osteoarthritis) of knee   . Obesity   . Osteomyelitis ([place])   . Peripheral neuropathy   . Peripheral vascular disease ([place])    a. 09/2016 Periph Angio: CTO R popliteal, CTO L prox/mid SFA->Med Rx; b. 05/2017 PTA: LSFA (Viabahn covered stent x 2), DEB to L Post Tibial; c. 06/2017 ABI: R 0.44, L 1.05.  . Tobacco abuse      Patient Active Problem List   Diagnosis Date Noted  . Recurrent falls 09/02/2020  . Unspecified protein-calorie malnutrition ([place]) 08/26/2020  . Wound of left foot 08/26/2020  . AF (paroxysmal atrial fibrillation) ([place])   . Fall 08/22/2020  . Atrial fibrillation with RVR ([place]) 08/22/2020  . CKD (chronic kidney disease), stage IIIa 08/22/2020  . Depression 08/22/2020  . Chronic systolic CHF (congestive heart failure) ([place]) 08/22/2020  . Iron deficiency anemia 08/22/2020  . Atrial flutter with rapid ventricular response ([place])   . Acute respiratory failure with hypoxia ([place]) 08/11/2020  . S/P gastric bypass 02/09/2020  . Severe recurrent major depression without psychotic features ([place]) 03/12/2018  . Cocaine abuse ([place]) 03/12/2018  . Suicidal ideation 03/12/2018  . Atherosclerosis of native arteries of the extremities with ulceration ([place]) 06/19/2017  . Gangrene of toe of left foot ([place]) 05/14/2017  . CAD (coronary artery disease), native coronary artery 05/05/2017  . Claudication in peripheral vascular disease ([place]) 10/18/2016  . Hyperlipidemia LDL goal <70 10/18/2016  . Onychomycosis 10/10/2016  . Encounters for administrative purpose 10/10/2016  . Amputated toe, left ([place]) 10/10/2016  . Amputated toe, right ([place]) 10/10/2016  . Osteomyelitis of left foot ([place]) 10/21/2015  . Hyperglycemia 10/02/2015  . Gastroesophageal reflux disease 11/16/2014  . Generalized  ischemic myocardial dysfunction 07/07/2014  . Peripheral neuropathy   . Tobacco abuse   . Obesity   . 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 Chronic coronary artery disease 06/08/2014  . Uncontrolled secondary diabetes mellitus with stage 3 CKD (GFR 30-59) (HCC)   . NSTEMI (non-ST elevated myocardial infarction) ([place]) 06/03/2014  . Essential hypertension 09/11/2012  . Diabetes mellitus type 2, insulin dependent ([place]) 09/11/2012    Past Surgical History:  Procedure Laterality Date  . ABDOMINAL AORTOGRAM W/LOWER EXTREMITY N/A  10/27/2016   Procedure: Abdominal Aortogram w/Lower Extremity;  Surgeon: Nelva Bush, MD;  Location: [place] [facility];  Service: Cardiovascular;  Laterality: N/A;  . AMPUTATION TOE Left 10/21/2015   Procedure: AMPUTATION TOE;  Surgeon: Sharlotte Alamo, DPM;  Location: [facility];  Service: Podiatry;  Laterality: Left;  . AMPUTATION TOE Left 05/15/2017   Procedure: AMPUTATION LEFT GREAT TOE;  Surgeon: Sharlotte Alamo, DPM;  Location: [facility];  Service: Podiatry;  Laterality: Left;  . AORTIC VALVE REPLACEMENT (AVR)/CORONARY ARTERY BYPASS GRAFTING (CABG)    . BREAST BIOPSY    . CARDIAC CATHETERIZATION  06/2014   95% stenosis mLAD, occlusion ostial OM1, 70% stenosis LCx, 95% stenosis mRCA, EF 45%.  Marland Kitchen CARDIOVERSION N/A 04/14/2020   Procedure: CARDIOVERSION with TEE;  Surgeon: Minna Merritts, MD;  Location: [facility];  Service: Cardiovascular;  Laterality: N/A;  . CARDIOVERSION N/A 05/28/2020   Procedure: CARDIOVERSION;  Surgeon: Minna Merritts, MD;  Location: [facility];  Service: Cardiovascular;  Laterality: N/A;  . CORONARY ARTERY BYPASS GRAFT N/A 06/08/2014   Procedure: CORONARY ARTERY BYPASS GRAFTING (CABG);  Surgeon: Grace Isaac, MD;  Location: [place];  Service: Open Heart Surgery;  Laterality: N/A;  Times 4 using left internal mammary artery to LAD artery and endoscopically harvested bilateral saphenous vein to Obtuse Marginal, Diagonal and Posterior Descending coronary arteries.  . CT ABD W & PELVIS WO CM  06/2014   nl liver, gallbladder, spleen, mild diverticular changes, no bowel wall inflammation, appendix nl, no hernia, no other sig abnormalities  . GASTRIC ROUX-EN-Y N/A 02/09/2020   Procedure: LAPAROSCOPIC ROUX-EN-Y GASTRIC BYPASS WITH UPPER ENDOSCOPY;  Surgeon: Johnathan Hausen, MD;  Location: [facility];  Service: General;  Laterality: N/A;  . LOWER EXTREMITY ANGIOGRAPHY Left 05/23/2017   Procedure: LOWER EXTREMITY ANGIOGRAPHY;  Surgeon: Algernon Huxley, MD;  Location: [place] [facility];   Service: Cardiovascular;  Laterality: Left;  . LOWER EXTREMITY ANGIOGRAPHY Left 05/28/2017   Procedure: LOWER EXTREMITY ANGIOGRAPHY;  Surgeon: Algernon Huxley, MD;  Location: [place] [facility];  Service: Cardiovascular;  Laterality: Left;  . LOWER EXTREMITY ANGIOGRAPHY Left 12/16/2018   Procedure: LOWER EXTREMITY ANGIOGRAPHY;  Surgeon: Algernon Huxley, MD;  Location: [place] [facility];  Service: Cardiovascular;  Laterality: Left;  . LOWER EXTREMITY ANGIOGRAPHY Right 12/23/2018   Procedure: LOWER EXTREMITY ANGIOGRAPHY;  Surgeon: Algernon Huxley, MD;  Location: [place] [facility];  Service: Cardiovascular;  Laterality: Right;  . TEE WITHOUT CARDIOVERSION N/A 06/08/2014   Procedure: TRANSESOPHAGEAL ECHOCARDIOGRAM (TEE);  Surgeon: Grace Isaac, MD;  Location: [place];  Service: Open Heart Surgery;  Laterality: N/A;  . TEE WITHOUT CARDIOVERSION N/A 04/14/2020   Procedure: TRANSESOPHAGEAL ECHOCARDIOGRAM (TEE);  Surgeon: Minna Merritts, MD;  Location: [facility];  Service: Cardiovascular;  Laterality: N/A;  . TOE AMPUTATION Right 12/2011   rt middle toe  . UPPER GI ENDOSCOPY N/A 02/09/2020   Procedure: UPPER GI ENDOSCOPY;  Surgeon: Johnathan Hausen, MD;  Location: [facility];  Service: General;  Laterality: N/A;  . US ECHOCARDIOGRAPHY  06/2014   EF 50-55%, HK of inf/post/inferolat walls, Ao sclerosis    Prior to Admission medications   Medication Sig Start Date End Date Taking? Authorizing Provider  ACCU-CHEK AVIVA PLUS test strip 1 each 3 (three) times daily. Patient not taking: Reported on 08/27/2020 12/31/19   [provider]  Accu-Chek Softclix Lancets lancets 1 each 3 (three) times daily. Patient not taking: Reported on 08/27/2020 01/13/20   [provider]  Alcohol Swabs (B-D SINGLE USE SWABS REGULAR) PADS Apply topically. Patient not taking: Reported on 08/27/2020 01/21/20   [provider]  amiodarone (PACERONE) 200 MG tablet Take 1 tablet (200 mg total) by mouth 2 (two) times  daily. 09/03/20   Hendricks Limes, MD  apixaban (ELIQUIS) 5 MG TABS tablet Take 1 tablet (5 mg total) by mouth 2 (two) times daily. 09/03/20   Hendricks Limes, MD  atorvastatin (LIPITOR) 80 MG tablet Take 1 tablet (80 mg total) by mouth daily at 6 PM. 09/03/20   Hendricks Limes, MD  Cholecalciferol (VITAMIN D3) 50 MCG (2000 UT) TABS Take 2,000 Units by mouth daily.    [provider]  clotrimazole (LOTRIMIN) 1 % cream Apply  as directed to affected area twice a day  for fungal infection 06/14/20   [provider]  diltiazem (CARDIZEM) 60 MG tablet Take 1 tablet (60 mg total) by mouth 2 (two) times daily. 09/03/20   Hendricks Limes, MD  docusate sodium (COLACE) 100 MG capsule Take 100 mg by mouth 2 (two) times daily.    [provider]  DROPLET PEN NEEDLES 31G X 6 MM [place]  11/10/19   [provider]  DULoxetine (CYMBALTA) 60 MG capsule Take 1 capsule (60 mg total) by mouth daily. 09/03/20   Hendricks Limes, MD  Emollient (CERAVE EX) Apply 1 application topically in the morning and at bedtime.    [provider]  furosemide (LASIX) 20 MG tablet Take 2 tablets (40 mg total) by mouth daily as needed (fluid retention.). 09/03/20   Hendricks Limes, MD  insulin aspart (NOVOLOG) 100 UNIT/ML injection Inject 4 Units into the skin 3 (three) times daily with meals. 08/25/20   Loletha Grayer, MD  insulin glargine (LANTUS) 100 UNIT/ML injection Inject 0.06 mLs (6 Units total) into the skin at bedtime. 09/03/20   Hendricks Limes, MD  lidocaine (LIDODERM) 5 % Place 1 patch onto the skin daily as needed (PAIN.).  02/24/19   [provider]  metoprolol tartrate (LOPRESSOR) 50 MG tablet TAKE 1 TABLET TWICE DAILY 09/24/20   Vickie Epley, MD  Multiple Vitamins-Minerals (MULTIVITAMIN WITH MINERALS) tablet Take 1 tablet by mouth in the morning and at bedtime.    [provider]  NON FORMULARY Med Pass Give 154ml po BID    [provider]   oxybutynin (DITROPAN) 5 MG tablet Take 5 mg by mouth 2 (two) times daily.  05/10/17   [provider]  pantoprazole (PROTONIX) 40 MG tablet Take 1 tablet (40 mg total) by mouth daily. 09/03/20   Hendricks Limes, MD  potassium chloride SA (KLOR-CON) 20 MEQ tablet TAKE 1 TABLET (20 MEQ TOTAL) BY MOUTH DAILY AS NEEDED. 09/03/20   Hendricks Limes, MD  pregabalin (LYRICA) 100 MG capsule Take 1 capsule (100 mg total) by mouth 3 (three) times daily. 09/08/20   Medina-Vargas, Monina C, NP  tiZANidine (ZANAFLEX) 4 MG capsule Take 1 capsule (4  mg total) by mouth 3 (three) times daily as needed for muscle spasms. 09/06/20   Medina-Vargas, Monina C, NP  varenicline (CHANTIX) 1 MG tablet Take 1 mg by mouth daily. Patient not taking: Reported on 09/02/2020 07/17/20   [provider]    Allergies Patient has no known allergies.  Family History  Problem Relation Age of Onset  . CAD Mother   . Hypertension Mother   . Diabetes Mother   . CAD Father   . Diabetes Father   . Sickle cell trait Other   . Asthma Other     Social History Social History   Tobacco Use  . Smoking status: Former Smoker    Packs/day: 0.50    Years: 40.00    Pack years: 20.00    Types: Cigarettes  . Smokeless tobacco: Never Used  . Tobacco comment: started Chantix 04/12/20  Vaping Use  . Vaping Use: Never used  Substance Use Topics  . Alcohol use: No    Alcohol/week: 0.0 standard drinks  . Drug use: Not Currently    Comment: crack    Review of Systems Constitutional: No fever/chills Eyes: No visual changes. ENT: No sore throat. Cardiovascular: Denies chest pain. Respiratory: Denies shortness of breath. Gastrointestinal: No abdominal pain.  No nausea, no vomiting.  No diarrhea.  No constipation. Genitourinary: Negative for dysuria. Musculoskeletal: Positive for pain in her left hip.  Negative for neck pain.  Negative for back pain. Integumentary: Negative for rash. Neurological: Negative for  headaches, focal weakness or numbness.   ____________________________________________   PHYSICAL EXAM:  VITAL SIGNS: ED Triage Vitals  Enc Vitals Group     BP 09/25/20 0537 115/66     Pulse Rate 09/25/20 0537 96     Resp 09/25/20 0537 14     Temp 09/25/20 0537 98.4 F (36.9 C)     Temp Source 09/25/20 0537 Oral     SpO2 09/25/20 0537 97 %     Weight 09/25/20 0530 110.7 kg (244 lb)     Height 09/25/20 0530 1.727 m (5\' 8" )     Head Circumference --      Peak Flow --      Pain Score 09/25/20 0531 8     Pain Loc --      Pain Edu? --      Excl. in [place]? --     Constitutional: Alert and oriented.  Eyes: Conjunctivae are normal.  Head: Atraumatic. Nose: No congestion/rhinnorhea. Mouth/Throat: Patient is wearing a mask. Neck: No stridor.  No meningeal signs.   Cardiovascular: Normal rate, regular rhythm. Good peripheral circulation. Respiratory: Normal respiratory effort.  No retractions. Gastrointestinal: Obese.  Soft and nontender. No distention.  Musculoskeletal: No gross deformities of her extremities.  I was able to passively range her left lower extremity without any apparent reproducible pain or tenderness.  She reports that there is tenderness to palpation of the left lateral aspect of her pelvis (along the left iliac crest). Neurologic:  Normal speech and language. No gross focal neurologic deficits are appreciated.  Skin:  Skin is warm, dry and intact. Psychiatric: Mood and affect are normal. Speech and behavior are normal.  ____________________________________________   LABS (all labs ordered are listed, but only abnormal results are displayed)  Labs Reviewed - No data to display ____________________________________________  EKG  No indication for emergent EKG ____________________________________________  RADIOLOGY I, Hinda Kehr, personally viewed and evaluated these images (plain radiographs) as part of my medical decision making, as well as reviewing  the  written report by the radiologist.  ED MD interpretation: No acute fracture nor dislocation  Official radiology report(s): DG HIP UNILAT WITH PELVIS 2-3 VIEWS LEFT  Result Date: 09/25/2020 CLINICAL DATA:  Fall, left hip pain EXAM: DG HIP (WITH OR WITHOUT PELVIS) 2-3V LEFT COMPARISON:  None. FINDINGS: No fracture or dislocation is seen. The joint spaces are preserved. Visualized bony pelvis appears intact. Degenerative changes of the lower lumbar spine. Left groin stents. IMPRESSION: No fracture or dislocation is seen. Electronically Signed   By: Julian Hy M.D.   On: 09/25/2020 06:14    ____________________________________________   PROCEDURES   Procedure(s) performed (including Critical Care):  Procedures   ____________________________________________   INITIAL IMPRESSION / MDM / [place] / ED COURSE  As part of my medical decision making, I reviewed the following data within the [facility] notes reviewed and incorporated, Radiograph reviewed  and Notes from prior ED visits   Differential diagnosis includes, but is not limited to, contusion, pelvic fracture, hip injury, musculoskeletal strain, electrolyte or metabolic abnormality.  The patient gives good history of a mechanical fall over her walker.  She is currently in no distress and has mild tenderness to palpation of the left iliac crest.  I have a low suspicion for fracture or dislocation but I will evaluate with x-rays of the left hip and pelvis to be sure.  Patient has no focal neurological deficits and no other symptoms either before or after the fall, no indication for further medical work-up.  Patient and her husband understand and agree with the plan.     Clinical Course as of 09/25/20 0745  Sat Sep 25, 2020  0709 I personally reviewed the patient's imaging and agree with the radiologist's interpretation that there is no acute fracture or dislocation.  I updated the patient  and her husband and she understands that she will go home and try conservative management.  I had my usual customary musculoskeletal pain/contusion discussion and they will follow-up as an outpatient.  I gave my usual and customary return precautions. [CF]    Clinical Course User Index [CF] Hinda Kehr, MD     ____________________________________________  FINAL CLINICAL IMPRESSION(S) / ED DIAGNOSES  Final diagnoses:  Pain  Fall, initial encounter  Contusion of left hip, initial encounter     MEDICATIONS GIVEN DURING THIS VISIT:  Medications - No data to display   ED Discharge Orders    None      *Please note:  Melinda [name] was evaluated in Emergency Department on 09/25/2020 for the symptoms described in the history of present illness. She was evaluated in the context of the global COVID-19 pandemic, which necessitated consideration that the patient might be at risk for infection with the SARS-CoV-2 virus that causes COVID-19. Institutional protocols and algorithms that pertain to the evaluation of patients at risk for COVID-19 are in a state of rapid change based on information released by regulatory bodies including the CDC and federal and state organizations. These policies and algorithms were followed during the patient's care in the ED.  Some ED evaluations and interventions may be delayed as a result of limited staffing during and after the pandemic.*  Note:  This document was prepared using Dragon voice recognition software and may include unintentional dictation errors.   Hinda Kehr, MD 09/25/20 [phone]

## 2020-09-25 NOTE — Discharge Instructions (Addendum)

## 2020-09-25 NOTE — ED Triage Notes (Signed)
Pt tripped over items in home this am injuring left leg/hip. Pt ambulatory to toilet on arrival to ed.

## 2020-09-28 NOTE — Telephone Encounter (Signed)
Pt has appointment with Dr. Quentin Ore on June 1 to rediscuss ablation.  Was unable to contact Pt x 4.  No further action needed at this time.

## 2020-09-29 ENCOUNTER — Ambulatory Visit: Payer: Medicare HMO | Admitting: Cardiology

## 2020-09-30 ENCOUNTER — Encounter: Payer: Self-pay | Admitting: Cardiology

## 2020-10-05 ENCOUNTER — Other Ambulatory Visit: Payer: Self-pay

## 2020-10-05 ENCOUNTER — Encounter: Payer: Medicare HMO | Attending: Physician Assistant | Admitting: Physician Assistant

## 2020-10-05 DIAGNOSIS — L97522 Non-pressure chronic ulcer of other part of left foot with fat layer exposed: Secondary | ICD-10-CM | POA: Diagnosis not present

## 2020-10-05 DIAGNOSIS — I2510 Atherosclerotic heart disease of native coronary artery without angina pectoris: Secondary | ICD-10-CM | POA: Diagnosis not present

## 2020-10-05 DIAGNOSIS — I10 Essential (primary) hypertension: Secondary | ICD-10-CM | POA: Diagnosis not present

## 2020-10-05 DIAGNOSIS — E1142 Type 2 diabetes mellitus with diabetic polyneuropathy: Secondary | ICD-10-CM | POA: Insufficient documentation

## 2020-10-05 DIAGNOSIS — Z794 Long term (current) use of insulin: Secondary | ICD-10-CM | POA: Diagnosis not present

## 2020-10-05 DIAGNOSIS — E1151 Type 2 diabetes mellitus with diabetic peripheral angiopathy without gangrene: Secondary | ICD-10-CM | POA: Diagnosis not present

## 2020-10-05 DIAGNOSIS — E11621 Type 2 diabetes mellitus with foot ulcer: Secondary | ICD-10-CM | POA: Diagnosis present

## 2020-10-05 DIAGNOSIS — Z951 Presence of aortocoronary bypass graft: Secondary | ICD-10-CM | POA: Diagnosis not present

## 2020-10-05 NOTE — Progress Notes (Signed)
ROBERT, SUNGA ([id number]) Visit Report for 10/05/2020 Allergy List Details Patient Name: Melinda [name], Melinda [name] Date of Service: 10/05/2020 10:00 AM Medical Record Number: [id number] Patient Account Number: [id number] Date of Birth/Sex: 1955-10-06 (64 y.o. F) Treating RN: Donnamarie Poag Primary Care Maimouna Rondeau: [name] Other Clinician: Referring Cristiano Capri: [name] Treating Ambermarie Honeyman/Extender: Jeri Cos Weeks in Treatment: 0 Allergies Active Allergies No Known Drug Allergies Allergy Notes Electronic Signature(s) Signed: 10/05/2020 1:59:03 PM By: Donnamarie Poag Entered By: Donnamarie Poag on 10/05/2020 10:15:24 [name] ([id number]) -------------------------------------------------------------------------------- Arrival Information Details Patient Name: [name] Date of Service: 10/05/2020 10:00 AM Medical Record Number: [id number] Patient Account Number: [id number] Date of Birth/Sex: Feb 14, 1956 (64 y.o. F) Treating RN: Donnamarie Poag Primary Care Lucca Greggs: [name] Other Clinician: Referring Bobbijo Holst: [name] Treating Reymond Maynez/Extender: Skipper Cliche in Treatment: 0 Visit Information Patient Arrived: Wheel Chair Arrival Time: 10:13 Accompanied By: brother Transfer Assistance: EasyPivot Patient Lift Patient Identification Verified: Yes Secondary Verification Process Completed: Yes Patient Has Alerts: Yes Patient Alerts: Patient on Blood Thinner ELIQUIS DIABETIC History Since Last Visit Electronic Signature(s) Signed: 10/05/2020 1:59:03 PM By: Donnamarie Poag Entered By: Donnamarie Poag on 10/05/2020 10:14:15 [name] ([id number]) -------------------------------------------------------------------------------- Clinic Level of Care Assessment Details Patient Name: [name]. Date of Service: 10/05/2020 10:00 AM Medical Record Number: [id number] Patient Account Number: [id number] Date of Birth/Sex: 1956-03-13 (64 y.o. F) Treating RN: Dolan Amen Primary Care  Renny Remer: [name] Other Clinician: Referring Jaleena Viviani: [name] Treating Tarance Balan/Extender: Skipper Cliche in Treatment: 0 Clinic Level of Care Assessment Items TOOL 1 Quantity Score X - Use when EandM and Procedure is performed on INITIAL visit 1 0 ASSESSMENTS - Nursing Assessment / Reassessment X - General Physical Exam (combine w/ comprehensive assessment (listed just below) when performed on new 1 20 pt. evals) X- 1 25 Comprehensive Assessment (HX, ROS, Risk Assessments, Wounds Hx, etc.) ASSESSMENTS - Wound and Skin Assessment / Reassessment []  - Dermatologic / Skin Assessment (not related to wound area) 0 ASSESSMENTS - Ostomy and/or Continence Assessment and Care []  - Incontinence Assessment and Management 0 []  - 0 Ostomy Care Assessment and Management (repouching, etc.) PROCESS - Coordination of Care X - Simple Patient / Family Education for ongoing care 1 15 []  - 0 Complex (extensive) Patient / Family Education for ongoing care X- 1 10 Staff obtains Consents, Records, Test Results / Process Orders []  - 0 Staff telephones HHA, Nursing Homes / Clarify orders / etc []  - 0 Routine Transfer to another Facility (non-emergent condition) []  - 0 Routine Hospital Admission (non-emergent condition) []  - 0 New Admissions / Biomedical engineer / Ordering NPWT, Apligraf, etc. []  - 0 Emergency Hospital Admission (emergent condition) PROCESS - Special Needs []  - Pediatric / Minor Patient Management 0 []  - 0 Isolation Patient Management []  - 0 Hearing / Language / Visual special needs []  - 0 Assessment of Community assistance (transportation, D/C planning, etc.) []  - 0 Additional assistance / Altered mentation []  - 0 Support Surface(s) Assessment (bed, cushion, seat, etc.) INTERVENTIONS - Miscellaneous []  - External ear exam 0 []  - 0 Patient Transfer (multiple staff / Civil Service fast streamer / Similar devices) []  - 0 Simple Staple / Suture removal (25 or less) []  -  0 Complex Staple / Suture removal (26 or more) []  - 0 Hypo/Hyperglycemic Management (do not check if billed separately) []  - 0 Ankle / Brachial Index (ABI) - do not check if billed separately Has the patient been seen at the hospital within the last  three years: Yes Total Score: 70 Level Of Care: New/Established - Level 2 TANEIA, MEALOR ([id number]) Electronic Signature(s) Unsigned Previous Signature: 10/05/2020 4:19:01 PM Version By: Georges Mouse, Minus Breeding RN Entered By: Georges Mouse, Minus Breeding on 10/05/2020 16:26:14 Signature(s): [name] ([id number]) Date(s): -------------------------------------------------------------------------------- Encounter Discharge Information Details Patient Name: [name]. Date of Service: 10/05/2020 10:00 AM Medical Record Number: [id number] Patient Account Number: [id number] Date of Birth/Sex: 30-Jul-1955 (64 y.o. F) Treating RN: Donnamarie Poag Primary Care Stevana Dufner: [name] Other Clinician: Referring Stevin Bielinski: [name] Treating Keerstin Bjelland/Extender: Skipper Cliche in Treatment: 0 Encounter Discharge Information Items Post Procedure Vitals Discharge Condition: Stable Temperature (F): 97.9 Ambulatory Status: Wheelchair Pulse (bpm): 104 Discharge Destination: Home Respiratory Rate (breaths/min): 18 Transportation: Private Auto Blood Pressure (mmHg): 128/84 Accompanied By: brother Schedule Follow-up Appointment: Yes Clinical Summary of Care: Electronic Signature(s) Signed: 10/05/2020 1:59:03 PM By: Donnamarie Poag Entered By: Donnamarie Poag on 10/05/2020 [place] [name] ([id number]) -------------------------------------------------------------------------------- Lower Extremity Assessment Details Patient Name: [name]. Date of Service: 10/05/2020 10:00 AM Medical Record Number: [id number] Patient Account Number: [id number] Date of Birth/Sex: 26-Jan-1956 (64 y.o. F) Treating RN: Donnamarie Poag Primary Care Casimir Barcellos:  [name] Other Clinician: Referring Karalyne Nusser: [name] Treating Keslee Harrington/Extender: Skipper Cliche in Treatment: 0 Edema Assessment Assessed: [Left: Yes] [Right: Yes] Edema: [Left: Yes] [Right: Yes] Calf Left: Right: Point of Measurement: 36 cm From Medial Instep 43 cm 40.5 cm Ankle Left: Right: Point of Measurement: 10 cm From Medial Instep 26 cm 25 cm Knee To Floor Left: Right: From Medial Instep 42 cm 42 cm Vascular Assessment Pulses: Dorsalis Pedis Palpable: [Left:No] [Right:No] Doppler Audible: [Left:Yes] [Right:Yes] Posterior Tibial Palpable: [Right:No] Doppler Audible: [Right:Yes] Blood Pressure: Brachial: [Right:100] Ankle: [Right:Dorsalis Pedis: 72 0.72] Electronic Signature(s) Signed: 10/05/2020 1:59:03 PM By: Donnamarie Poag Entered By: Donnamarie Poag on 10/05/2020 10:32:47 [name] ([id number]) -------------------------------------------------------------------------------- Multi Wound Chart Details Patient Name: [name]. Date of Service: 10/05/2020 10:00 AM Medical Record Number: [id number] Patient Account Number: [id number] Date of Birth/Sex: [birth year] (64 y.o. F) Treating RN: Dolan Amen Primary Care Orenthal Debski: [name] Other Clinician: Referring Dangela How: [name] Treating Therese Rocco/Extender: Skipper Cliche in Treatment: 0 Vital Signs Height(in): 68 Pulse(bpm): 104 Weight(lbs): 244 Blood Pressure(mmHg): 124/84 Body Mass Index(BMI): 37 Temperature(F): 97.9 Respiratory Rate(breaths/min): 18 Photos: Wound Location: Right, Lateral, Anterior Lower Leg Right, Lateral Calcaneus Left, Plantar Foot Wounding Event: Gradually Appeared Gradually Appeared Gradually Appeared Primary Etiology: Venous Leg Ulcer Pressure Ulcer Diabetic Wound/Ulcer of the Lower Extremity Comorbid History: Cataracts, Arrhythmia, Coronary Cataracts, Arrhythmia, Coronary Cataracts, Arrhythmia, Coronary Artery Disease, Hypertension, Artery Disease,  Hypertension, Artery Disease, Hypertension, Myocardial Infarction, Peripheral Myocardial Infarction, Peripheral Myocardial Infarction, Peripheral Venous Disease, Type II Diabetes, Venous Disease, Type II Diabetes, Venous Disease, Type II Diabetes, Osteoarthritis, Osteomyelitis, Osteoarthritis, Osteomyelitis, Osteoarthritis, Osteomyelitis, Neuropathy Neuropathy Neuropathy Date Acquired: 08/29/2020 08/29/2020 05/01/2017 Weeks of Treatment: 0 0 0 Wound Status: Open Open Open Clustered Wound: Yes No No Clustered Quantity: N/A N/A N/A Measurements L x W x D (cm) 2x5.5x0.1 1.3x1.7x0.3 0.6x0.3x0.4 Area (cm) : 8.639 1.736 0.141 Volume (cm) : 0.864 0.521 0.057 % Reduction in Area: N/A 0.00% N/A % Reduction in Volume: N/A 0.00% N/A Classification: Full Thickness Without Exposed Category/Stage III Grade 2 Support Structures Exudate Amount: Medium Medium Medium Exudate Type: Serous Serosanguineous Serosanguineous Exudate Color: amber red, brown red, brown Granulation Amount: Medium (34-66%) Small (1-33%) Large (67-100%) Granulation Quality: Red, Pink N/A Red Necrotic Amount: Medium (34-66%) Large (67-100%) Small (1-33%) Necrotic Tissue: Adherent [facility]  [place] Exposed Structures: Fat Layer (Subcutaneous Tissue): Fat Layer (Subcutaneous Tissue): Fat Layer (Subcutaneous Tissue): Yes Yes Yes Fascia: No Fascia: No Fascia: No Tendon: No Tendon: No Tendon: No Muscle: No Muscle: No Muscle: No Joint: No Joint: No Joint: No Bone: No Bone: No Bone: No Epithelialization: N/A None N/A Wound Number: 9 N/A N/A Photos: No Photos N/A N/A Wound Location: Right, Medial Lower Leg N/A N/A Wounding Event: Gradually Appeared N/A N/A CORENA, TILSON ([id number]) Primary Etiology: Venous Leg Ulcer N/A N/A Comorbid History: Cataracts, Arrhythmia, Coronary N/A N/A Artery Disease, Hypertension, Myocardial Infarction, Peripheral Venous Disease, Type II  Diabetes, Osteoarthritis, Osteomyelitis, Neuropathy Date Acquired: 10/05/2020 N/A N/A Weeks of Treatment: 0 N/A N/A Wound Status: Open N/A N/A Clustered Wound: Yes N/A N/A Clustered Quantity: 2 N/A N/A Measurements L x W x D (cm) 2.5x3.2x0.2 N/A N/A Area (cm) : 6.283 N/A N/A Volume (cm) : 1.257 N/A N/A % Reduction in Area: 0.00% N/A N/A % Reduction in Volume: 0.00% N/A N/A Classification: Full Thickness Without Exposed N/A N/A Support Structures Exudate Amount: Medium N/A N/A Exudate Type: Serous N/A N/A Exudate Color: amber N/A N/A Granulation Amount: Large (67-100%) N/A N/A Granulation Quality: Red, Pink N/A N/A Necrotic Amount: None Present (0%) N/A N/A Necrotic Tissue: N/A N/A N/A Exposed Structures: Fat Layer (Subcutaneous Tissue): N/A N/A Yes Fascia: No Tendon: No Muscle: No Joint: No Bone: No Epithelialization: Large (67-100%) N/A N/A Treatment Notes Electronic Signature(s) Signed: 10/05/2020 4:19:01 PM By: Georges Mouse, Minus Breeding RN Entered By: Georges Mouse, Minus Breeding on 10/05/2020 11:07:03 [name] ([id number]) -------------------------------------------------------------------------------- Multi-Disciplinary Care Plan Details Patient Name: [name]. Date of Service: 10/05/2020 10:00 AM Medical Record Number: [id number] Patient Account Number: [id number] Date of Birth/Sex: 10/29/1955 (64 y.o. F) Treating RN: Dolan Amen Primary Care Salihah Peckham: [name] Other Clinician: Referring Indy Kuck: [name] Treating Anitria Andon/Extender: Skipper Cliche in Treatment: 0 Active Inactive Necrotic Tissue Nursing Diagnoses: Impaired tissue integrity related to necrotic/devitalized tissue Goals: Necrotic/devitalized tissue will be minimized in the wound bed Date Initiated: 10/05/2020 Target Resolution Date: 10/05/2020 Goal Status: Active Patient/caregiver will verbalize understanding of reason and process for debridement of necrotic tissue Date Initiated:  10/05/2020 Target Resolution Date: 10/05/2020 Goal Status: Active Interventions: Assess patient pain level pre-, during and post procedure and prior to discharge Provide education on necrotic tissue and debridement process Treatment Activities: Apply topical anesthetic as ordered : 10/05/2020 Excisional debridement : 10/05/2020 Notes: Venous Leg Ulcer Nursing Diagnoses: Actual venous Insuffiency (use after diagnosis is confirmed) Goals: Patient will maintain optimal edema control Date Initiated: 10/05/2020 Target Resolution Date: 10/05/2020 Goal Status: Active Patient/caregiver will verbalize understanding of disease process and disease management Date Initiated: 10/05/2020 Target Resolution Date: 10/05/2020 Goal Status: Active Verify adequate tissue perfusion prior to therapeutic compression application Date Initiated: 10/05/2020 Target Resolution Date: 10/05/2020 Goal Status: Active Interventions: Assess peripheral edema status every visit. Compression as ordered Provide education on venous insufficiency Treatment Activities: Therapeutic compression applied : 10/05/2020 Notes: Wound/Skin Impairment Nursing Diagnoses: Impaired tissue integrity Wieseler, Marchetta E. ([id number]) Goals: Patient will demonstrate a reduced rate of smoking or cessation of smoking Date Initiated: 10/05/2020 Target Resolution Date: 11/04/2020 Goal Status: Active Patient/caregiver will verbalize understanding of skin care regimen Date Initiated: 10/05/2020 Target Resolution Date: 10/05/2020 Goal Status: Active Ulcer/skin breakdown will have a volume reduction of 30% by week 4 Date Initiated: 10/05/2020 Target Resolution Date: 11/04/2020 Goal Status: Active Ulcer/skin breakdown will have a volume reduction of 50% by week 8 Date Initiated: 10/05/2020 Target  Resolution Date: 12/05/2020 Goal Status: Active Ulcer/skin breakdown will have a volume reduction of 80% by week 12 Date Initiated: 10/05/2020 Target Resolution Date:  01/05/2021 Goal Status: Active Ulcer/skin breakdown will heal within 14 weeks Date Initiated: 10/05/2020 Target Resolution Date: 02/04/2021 Goal Status: Active Interventions: Assess patient/caregiver ability to obtain necessary supplies Assess patient/caregiver ability to perform ulcer/skin care regimen upon admission and as needed Assess ulceration(s) every visit Provide education on smoking Provide education on ulcer and skin care Treatment Activities: Referred to DME Mckenna Gamm for dressing supplies : 10/05/2020 Skin care regimen initiated : 10/05/2020 Notes: Electronic Signature(s) Signed: 10/05/2020 4:19:01 PM By: Georges Mouse, Minus Breeding RN Entered By: Georges Mouse, Minus Breeding on 10/05/2020 11:06:54 [name] ([id number]) -------------------------------------------------------------------------------- Pain Assessment Details Patient Name: [name]. Date of Service: 10/05/2020 10:00 AM Medical Record Number: [id number] Patient Account Number: [id number] Date of Birth/Sex: [birth year] (64 y.o. F) Treating RN: Donnamarie Poag Primary Care Melisia Leming: [name] Other Clinician: Referring Milderd Manocchio: [name] Treating Lafawn Lenoir/Extender: Skipper Cliche in Treatment: 0 Active Problems Location of Pain Severity and Description of Pain Patient Has Paino No Site Locations Rate the pain. Current Pain Level: 0 Pain Management and Medication Current Pain Management: Notes STATES NONE AT THIS TIME Electronic Signature(s) Signed: 10/05/2020 1:59:03 PM By: Donnamarie Poag Entered By: Donnamarie Poag on 10/05/2020 10:14:29 [name] ([id number]) -------------------------------------------------------------------------------- Patient/Caregiver Education Details Patient Name: [name]. Date of Service: 10/05/2020 10:00 AM Medical Record Number: [id number] Patient Account Number: [id number] Date of Birth/Gender: June 16, 1955 (64 y.o. F) Treating RN: Dolan Amen Primary Care Physician:  [name] Other Clinician: Referring Physician: [name] Treating Physician/Extender: Skipper Cliche in Treatment: 0 Education Assessment Education Provided To: Patient Education Topics Provided Wound Debridement: Methods: Explain/Verbal Responses: State content correctly Wound/Skin Impairment: Methods: Explain/Verbal Responses: State content correctly Electronic Signature(s) Signed: 10/05/2020 4:19:01 PM By: Georges Mouse, Minus Breeding RN Entered By: Georges Mouse, Minus Breeding on 10/05/2020 11:21:20 [name] ([id number]) -------------------------------------------------------------------------------- Wound Assessment Details Patient Name: [name]. Date of Service: 10/05/2020 10:00 AM Medical Record Number: [id number] Patient Account Number: [id number] Date of Birth/Sex: 07/10/1955 (64 y.o. F) Treating RN: Donnamarie Poag Primary Care Niaja Stickley: [name] Other Clinician: Referring Leesha Veno: [name] Treating Liliane Mallis/Extender: Skipper Cliche in Treatment: 0 Wound Status Wound Number: 6 Primary Venous Leg Ulcer Etiology: Wound Location: Right, Lateral, Anterior Lower Leg Wound Open Wounding Event: Gradually Appeared Status: Date Acquired: 08/29/2020 Comorbid Cataracts, Arrhythmia, Coronary Artery Disease, Weeks Of Treatment: 0 History: Hypertension, Myocardial Infarction, Peripheral Venous Clustered Wound: Yes Disease, Type II Diabetes, Osteoarthritis, Osteomyelitis, Neuropathy Photos Wound Measurements Length: (cm) 2 Width: (cm) 5.5 Depth: (cm) 0.1 Area: (cm) 8.639 Volume: (cm) 0.864 % Reduction in Area: % Reduction in Volume: Tunneling: No Undermining: No Wound Description Classification: Full Thickness Without Exposed Support Structures Exudate Amount: Medium Exudate Type: Serous Exudate Color: amber Foul Odor After Cleansing: No Slough/Fibrino Yes Wound Bed Granulation Amount: Medium (34-66%) Exposed Structure Granulation Quality: Red,  Pink Fascia Exposed: No Necrotic Amount: Medium (34-66%) Fat Layer (Subcutaneous Tissue) Exposed: Yes Necrotic Quality: Adherent Slough Tendon Exposed: No Muscle Exposed: No Joint Exposed: No Bone Exposed: No Treatment Notes Wound #6 (Lower Leg) Wound Laterality: Right, Lateral, Anterior Cleanser Soap and Water Discharge Instruction: Gently cleanse wound with antibacterial soap, rinse and pat dry prior to dressing wounds Fayette, Hayli E. ([id number]) Topical Primary Dressing Silvercel Small 2x2 (in/in) Discharge Instruction: Apply Silvercel Small 2x2 (in/in) as instructed Secondary Dressing ABD Pad 5x9 (in/in) Discharge Instruction: Cover with  ABD pad Secured With [employer] Sterile or Non-Sterile 6-ply 4.5x4 (yd/yd) Discharge Instruction: Apply Kerlix from base of toes to 2 fingers width below the knee Coban Cohesive Bandage 4x5 (yds) Stretched Discharge Instruction: Apply coban LIGHTLY from base of toes to 2 fingers width below the knee to secure kerlix Compression Wrap Compression Stockings Add-Ons Electronic Signature(s) Signed: 10/05/2020 1:59:03 PM By: Donnamarie Poag Entered By: Donnamarie Poag on 10/05/2020 10:37:23 [name] ([id number]) -------------------------------------------------------------------------------- Wound Assessment Details Patient Name: [name]. Date of Service: 10/05/2020 10:00 AM Medical Record Number: [id number] Patient Account Number: [id number] Date of Birth/Sex: [birth year] (64 y.o. F) Treating RN: Donnamarie Poag Primary Care Jerine Surles: [name] Other Clinician: Referring Dona Walby: [name] Treating Juliette Standre/Extender: Skipper Cliche in Treatment: 0 Wound Status Wound Number: 7 Primary Pressure Ulcer Etiology: Wound Location: Right, Lateral Calcaneus Wound Open Wounding Event: Gradually Appeared Status: Date Acquired: 08/29/2020 Comorbid Cataracts, Arrhythmia, Coronary Artery Disease, Weeks Of Treatment:  0 History: Hypertension, Myocardial Infarction, Peripheral Venous Clustered Wound: No Disease, Type II Diabetes, Osteoarthritis, Osteomyelitis, Neuropathy Photos Wound Measurements Length: (cm) 1.3 Width: (cm) 1.7 Depth: (cm) 0.3 Area: (cm) 1.736 Volume: (cm) 0.521 % Reduction in Area: 0% % Reduction in Volume: 0% Epithelialization: None Tunneling: No Undermining: No Wound Description Classification: Category/Stage III Exudate Amount: Medium Exudate Type: Serosanguineous Exudate Color: red, brown Foul Odor After Cleansing: No Slough/Fibrino Yes Wound Bed Granulation Amount: Small (1-33%) Exposed Structure Necrotic Amount: Large (67-100%) Fascia Exposed: No Necrotic Quality: Eschar, Adherent Slough Fat Layer (Subcutaneous Tissue) Exposed: Yes Tendon Exposed: No Muscle Exposed: No Joint Exposed: No Bone Exposed: No Treatment Notes Wound #7 (Calcaneus) Wound Laterality: Right, Lateral Cleanser Soap and Water Discharge Instruction: Gently cleanse wound with antibacterial soap, rinse and pat dry prior to dressing wounds Peri-Wound Care KYNDAL, HERINGER ([id number]) Topical Primary Dressing Iodosorb 40 (g) Discharge Instruction: Apply IodoSorb to wound bed only as directed. Secondary Dressing ABD Pad 5x9 (in/in) Discharge Instruction: Cover with ABD pad Secured With Kerlix Roll Sterile or Non-Sterile 6-ply 4.5x4 (yd/yd) Discharge Instruction: Apply Kerlix from base of toes to 2 fingers width below the knee Coban Cohesive Bandage 4x5 (yds) Stretched Discharge Instruction: Apply coban LIGHTLY from base of toes to 2 fingers width below the knee to secure kerlix Compression Wrap Compression Stockings Add-Ons Electronic Signature(s) Signed: 10/05/2020 1:59:03 PM By: Donnamarie Poag Signed: 10/05/2020 4:19:01 PM By: Georges Mouse, Minus Breeding RN Entered By: Georges Mouse, Kenia on 10/05/2020 11:04:59 [name]  ([id number]) -------------------------------------------------------------------------------- Wound Assessment Details Patient Name: [name]. Date of Service: 10/05/2020 10:00 AM Medical Record Number: [id number] Patient Account Number: [id number] Date of Birth/Sex: 03/17/1956 (64 y.o. F) Treating RN: Donnamarie Poag Primary Care Dasha Kawabata: [name] Other Clinician: Referring Zaccary Creech: [name] Treating Vivika Poythress/Extender: Skipper Cliche in Treatment: 0 Wound Status Wound Number: 8 Primary Diabetic Wound/Ulcer of the Lower Extremity Etiology: Wound Location: Left, Plantar Foot Wound Open Wounding Event: Gradually Appeared Status: Date Acquired: 05/01/2017 Comorbid Cataracts, Arrhythmia, Coronary Artery Disease, Weeks Of Treatment: 0 History: Hypertension, Myocardial Infarction, Peripheral Venous Clustered Wound: No Disease, Type II Diabetes, Osteoarthritis, Osteomyelitis, Neuropathy Photos Wound Measurements Length: (cm) 0.6 Width: (cm) 0.3 Depth: (cm) 0.4 Area: (cm) 0.141 Volume: (cm) 0.057 % Reduction in Area: % Reduction in Volume: Tunneling: No Undermining: No Wound Description Classification: Grade 2 Exudate Amount: Medium Exudate Type: Serosanguineous Exudate Color: red, brown Foul Odor After Cleansing: No Slough/Fibrino Yes Wound Bed Granulation Amount: Large (67-100%) Exposed Structure Granulation Quality: Red Fascia Exposed: No Necrotic Amount: Small (  1-33%) Fat Layer (Subcutaneous Tissue) Exposed: Yes Necrotic Quality: Adherent Slough Tendon Exposed: No Muscle Exposed: No Joint Exposed: No Bone Exposed: No Electronic Signature(s) Signed: 10/05/2020 1:59:03 PM By: Donnamarie Poag Entered By: Donnamarie Poag on 10/05/2020 10:44:02 [name] ([id number]) -------------------------------------------------------------------------------- Wound Assessment Details Patient Name: [name]. Date of Service: 10/05/2020 10:00 AM Medical Record  Number: [id number] Patient Account Number: [id number] Date of Birth/Sex: [birth year] (64 y.o. F) Treating RN: Dolan Amen Primary Care Baila Rouse: [name] Other Clinician: Referring Yajayra Feldt: [name] Treating Chelse Matas/Extender: Skipper Cliche in Treatment: 0 Wound Status Wound Number: 9 Primary Venous Leg Ulcer Etiology: Wound Location: Right, Medial Lower Leg Wound Open Wounding Event: Gradually Appeared Status: Date Acquired: 10/05/2020 Comorbid Cataracts, Arrhythmia, Coronary Artery Disease, Weeks Of Treatment: 0 History: Hypertension, Myocardial Infarction, Peripheral Venous Clustered Wound: Yes Disease, Type II Diabetes, Osteoarthritis, Osteomyelitis, Neuropathy Wound Measurements Length: (cm) 2.5 Width: (cm) 3.2 Depth: (cm) 0.2 Clustered Quantity: 2 Area: (cm) 6.283 Volume: (cm) 1.257 % Reduction in Area: 0% % Reduction in Volume: 0% Epithelialization: Large (67-100%) Tunneling: No Undermining: No Wound Description Classification: Full Thickness Without Exposed Support Structures Exudate Amount: Medium Exudate Type: Serous Exudate Color: amber Foul Odor After Cleansing: No Slough/Fibrino No Wound Bed Granulation Amount: Large (67-100%) Exposed Structure Granulation Quality: Red, Pink Fascia Exposed: No Necrotic Amount: None Present (0%) Fat Layer (Subcutaneous Tissue) Exposed: Yes Tendon Exposed: No Muscle Exposed: No Joint Exposed: No Bone Exposed: No Treatment Notes Wound #9 (Lower Leg) Wound Laterality: Right, Medial Cleanser Soap and Water Discharge Instruction: Gently cleanse wound with antibacterial soap, rinse and pat dry prior to dressing wounds Peri-Wound Care Topical Primary Dressing Silvercel Small 2x2 (in/in) Discharge Instruction: Apply Silvercel Small 2x2 (in/in) as instructed Secondary Dressing ABD Pad 5x9 (in/in) Discharge Instruction: Cover with ABD pad Secured With Kerlix Roll Sterile or Non-Sterile 6-ply 4.5x4  (yd/yd) [name] ([id number]) Discharge Instruction: Apply Kerlix from base of toes to 2 fingers width below the knee Coban Cohesive Bandage 4x5 (yds) Stretched Discharge Instruction: Apply coban LIGHTLY from base of toes to 2 fingers width below the knee to secure kerlix Compression Wrap Compression Stockings Add-Ons Electronic Signature(s) Signed: 10/05/2020 4:19:01 PM By: Georges Mouse, Minus Breeding RN Entered By: Georges Mouse, Minus Breeding on 10/05/2020 11:03:54 [name] ([id number]) -------------------------------------------------------------------------------- Vitals Details Patient Name: [name]. Date of Service: 10/05/2020 10:00 AM Medical Record Number: [id number] Patient Account Number: [id number] Date of Birth/Sex: 04-14-1956 (64 y.o. F) Treating RN: Donnamarie Poag Primary Care Callista Hoh: [name] Other Clinician: Referring Renesmae Donahey: [name] Treating Keiondra Brookover/Extender: Skipper Cliche in Treatment: 0 Vital Signs Time Taken: 10:13 Temperature (F): 97.9 Height (in): 68 Pulse (bpm): 104 Source: Stated Respiratory Rate (breaths/min): 18 Weight (lbs): 244 Blood Pressure (mmHg): 124/84 Source: Stated Reference Range: 80 - 120 mg / dl Body Mass Index (BMI): 37.1 Notes unable to safely stand on scales at this time Electronic Signature(s) Signed: 10/05/2020 1:59:03 PM By: Donnamarie Poag Entered ByDonnamarie Poag on 10/05/2020 10:15:14

## 2020-10-05 NOTE — Progress Notes (Signed)
AUREA, ARONOV ([id number]) Visit Report for 10/05/2020 Abuse/Suicide Risk Screen Details Patient Name: Melinda [name], Melinda [name] Date of Service: 10/05/2020 10:00 AM Medical Record Number: [id number] Patient Account Number: [id number] Date of Birth/Sex: March 14, 1956 (64 y.o. F) Treating RN: Donnamarie Poag Primary Care Roanne Haye: [name] Other Clinician: Referring Jerrianne Hartin: [name] Treating Khalea Ventura/Extender: Skipper Cliche in Treatment: 0 Abuse/Suicide Risk Screen Items Answer ABUSE RISK SCREEN: Has anyone close to you tried to hurt or harm you recentlyo No Do you feel uncomfortable with anyone in your familyo No Has anyone forced you do things that you didnot want to doo No Electronic Signature(s) Signed: 10/05/2020 1:59:03 PM By: Donnamarie Poag Entered By: Donnamarie Poag on 10/05/2020 10:19:46 Melinda [name] ([id number]) -------------------------------------------------------------------------------- Activities of Daily Living Details Patient Name: Melinda [name] Date of Service: 10/05/2020 10:00 AM Medical Record Number: [id number] Patient Account Number: [id number] Date of Birth/Sex: 1956-04-22 (64 y.o. F) Treating RN: Donnamarie Poag Primary Care Makaila Windle: [name] Other Clinician: Referring Nyellie Yetter: [name] Treating Evanie Buckle/Extender: Skipper Cliche in Treatment: 0 Activities of Daily Living Items Answer Activities of Daily Living (Please select one for each item) Drive Automobile Not Able Take Medications Completely Able Use Telephone Completely Able Care for Appearance Completely Able Use Toilet Completely Able Bath / Shower Completely Able Dress Self Completely Able Feed Self Completely Able Walk Completely Able Get In / Out Bed Need Assistance Housework Need Assistance Prepare Meals Need Assistance Handle Money Completely Able Shop for Self Need Assistance Electronic Signature(s) Signed: 10/05/2020 1:59:03 PM By: Donnamarie Poag Entered By: Donnamarie Poag on 10/05/2020  10:20:28 Melinda [name] ([id number]) -------------------------------------------------------------------------------- Education Screening Details Patient Name: Melinda [name] Date of Service: 10/05/2020 10:00 AM Medical Record Number: [id number] Patient Account Number: [id number] Date of Birth/Sex: 08-07-1955 (64 y.o. F) Treating RN: Donnamarie Poag Primary Care Vonceil Upshur: [name] Other Clinician: Referring Traniece Boffa: [name] Treating [place] Giammona/Extender: Skipper Cliche in Treatment: 0 Primary Learner Assessed: Patient Learning Preferences/Education Level/Primary Language Learning Preference: Explanation Highest Education Level: High School Preferred Language: English Cognitive Barrier Language Barrier: No Translator Needed: No Memory Deficit: No Emotional Barrier: No Cultural/Religious Beliefs Affecting Medical Care: No Physical Barrier Impaired Vision: No Impaired Hearing: No Decreased Hand dexterity: No Knowledge/Comprehension Knowledge Level: High Comprehension Level: High Ability to understand written instructions: High Ability to understand verbal instructions: High Motivation Anxiety Level: Calm Cooperation: Cooperative Education Importance: Acknowledges Need Interest in Health Problems: Asks Questions Perception: Coherent Willingness to Engage in Self-Management High Activities: Readiness to Engage in Self-Management High Activities: Electronic Signature(s) Signed: 10/05/2020 1:59:03 PM By: Donnamarie Poag Entered ByDonnamarie Poag on 10/05/2020 10:20:59 Melinda [name] ([id number]) -------------------------------------------------------------------------------- Fall Risk Assessment Details Patient Name: Melinda [name] Date of Service: 10/05/2020 10:00 AM Medical Record Number: [id number] Patient Account Number: [id number] Date of Birth/Sex: June 27, 1955 (64 y.o. F) Treating RN: Donnamarie Poag Primary Care Montzerrat Brunell: [name] Other Clinician: Referring  Yuleni Burich: [name] Treating Malissia Rabbani/Extender: Skipper Cliche in Treatment: 0 Fall Risk Assessment Items Have you had 2 or more falls in the last 12 monthso 0 Yes Have you had any fall that resulted in injury in the last 12 monthso 0 No FALLS RISK SCREEN History of falling - immediate or within 3 months 25 Yes Secondary diagnosis (Do you have 2 or more medical diagnoseso) 0 No Ambulatory aid None/bed rest/wheelchair/nurse 0 No Crutches/cane/walker 15 Yes Furniture 0 No Intravenous therapy Access/Saline/Heparin Lock 0 No Gait/Transferring Normal/ bed rest/ wheelchair 0 No Weak (short steps with or without shuffle, stooped  but able to lift head while walking, may 10 Yes seek support from furniture) Impaired (short steps with shuffle, may have difficulty arising from chair, head down, impaired 0 No balance) Mental Status Oriented to own ability 0 Yes Electronic Signature(s) Signed: 10/05/2020 1:59:03 PM By: Donnamarie Poag Entered By: Donnamarie Poag on 10/05/2020 10:21:32 Melinda [name] ([id number]) -------------------------------------------------------------------------------- Foot Assessment Details Patient Name: Melinda [name] Date of Service: 10/05/2020 10:00 AM Medical Record Number: [id number] Patient Account Number: [id number] Date of Birth/Sex: [birth year] (64 y.o. F) Treating RN: Donnamarie Poag Primary Care Kabe Mckoy: [name] Other Clinician: Referring Kaelen Caughlin: [name] Treating Tamkia Temples/Extender: Skipper Cliche in Treatment: 0 Foot Assessment Items Site Locations + = Sensation present, - = Sensation absent, C = Callus, U = Ulcer R = Redness, W = Warmth, M = Maceration, PU = Pre-ulcerative lesion F = Fissure, S = Swelling, D = Dryness Assessment Right: Left: Other Deformity: No No Prior Foot Ulcer: No No Prior Amputation: No No Charcot Joint: No No Ambulatory Status: Ambulatory Without Help Gait: Unsteady Electronic Signature(s) Signed: 10/05/2020  1:59:03 PM By: Donnamarie Poag Entered By: Donnamarie Poag on 10/05/2020 10:23:38 Melinda [name] ([id number]) -------------------------------------------------------------------------------- Nutrition Risk Screening Details Patient Name: Melinda [name] Date of Service: 10/05/2020 10:00 AM Medical Record Number: [id number] Patient Account Number: [id number] Date of Birth/Sex: 1955-11-26 (64 y.o. F) Treating RN: Donnamarie Poag Primary Care Merary Garguilo: [name] Other Clinician: Referring Pheonix Wisby: [name] Treating Simrah Chatham/Extender: Skipper Cliche in Treatment: 0 Height (in): 68 Weight (lbs): 244 Body Mass Index (BMI): 37.1 Nutrition Risk Screening Items Score Screening NUTRITION RISK SCREEN: I have an illness or condition that made me change the kind and/or amount of food I eat 2 Yes I eat fewer than two meals per day 0 No I eat few fruits and vegetables, or milk products 0 No I have three or more drinks of beer, liquor or wine almost every day 0 No I have tooth or mouth problems that make it hard for me to eat 0 No I don't always have enough money to buy the food I need 0 No I eat alone most of the time 1 Yes I take three or more different prescribed or over-the-counter drugs a day 1 Yes Without wanting to, I have lost or gained 10 pounds in the last six months 0 No I am not always physically able to shop, cook and/or feed myself 0 No Nutrition Protocols Good Risk Protocol 0 No interventions needed Moderate Risk Protocol High Risk Proctocol Risk Level: Moderate Risk Score: 4 Electronic Signature(s) Signed: 10/05/2020 1:59:03 PM By: Donnamarie Poag Entered ByDonnamarie Poag on 10/05/2020 10:21:57

## 2020-10-06 NOTE — Progress Notes (Signed)
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 for continued progress toward healing. 05/11/2020 upon evaluation today patient appears to be doing well with regard to her plantar foot ulcer.  She did have a lot of callus noted although its actually been since before Christmas that have seen her last. She missed her appointment last week secondary to weather. Fortunately there is no evidence of active infection at this time which is great news. No fever chills 06/22/2020 upon evaluation today patient appears to be doing well with regard to her wound from the standpoint of infection I do not see any signs of infection. With that being said I unfortunately am having some issues here with callus buildup again it has been almost 6 weeks since have seen her previously. Obviously she has a lot of callus buildup even just week to week and this is no exception. Nonetheless we are can have to perform sharp debridement to clear this away and try to get this moving in the right direction. 06/29/2020 upon evaluation today patient appears to be doing decently well in regard to her foot ulcer. She does have some callus but this is minimal compared to previously noted callus amounts. Fortunately there does not appear to be any evidence of active infection which is great news. No fevers, chills, nausea, vomiting, or diarrhea. 07/06/2020 upon evaluation today patient appears to be doing well with regard to her wound. This is measuring smaller and actually is doing much better. Fortunately there is no evidence of active infection at this time. No fevers, chills, nausea, vomiting, or diarrhea. 07/13/2020 upon evaluation today patient appears to be doing well with regard to her wound currently. Fortunately this is not looking to be any worse although not sure making the counter progress and I would really like to be seen. She still develops callus even though is not as much as she has in times past I still think it is too much and preventing healing. Subsequently I think she may benefit from an application of a total contact cast. I discussed that with her today and subsequently the fact that depending on how things  go we may be able to get this wound healed more effectively with the cast in any other way. 07/22/2020 upon evaluation today patient appears to be doing decently well in regard to her foot ulcer although she is really not significantly improved this does not appear to be doing any worse which is great news overall I am pleased in that  regard. In general I feel like that she still would benefit from the total contact cast. She just cannot completely offload otherwise and I think that she is going to continue to have issues if we do not do something about that. 07/27/2020 upon evaluation today patient appears to be doing well with regard to her wound though she still has a lot of callus buildup unfortunately. There does not appear to be any signs of active infection which is great news and overall very pleased with where things stand today. 07/29/2020 upon evaluation today patient's wound actually appears to be doing significantly better. The cast did extremely well in this regard. Unfortunately she tells me that it was so heavy and she is already so weak that she was not even able to sleep with her foot in the bed she had to hanging off the edge and obviously this was not ideal her swelling is creased. Nonetheless I think that the main issue that I see currently is that the patient unfortunately is not can go back into the cast despite the fact that the wound looks significantly smaller even in 2 days compared to what it was previous. 08/05/20 on evaluation today patient appears to be doing well with regard to her wound. Fortunately there is no signs of worsening infection wound appears to be a little bit smaller which is great news. No fevers, chills, nausea, vomiting, or diarrhea. Readmission: 10/05/2020 upon evaluation today patient appears to be doing poorly in regard to her right leg where she has multiple wounds she also has a wound on her heel and subsequently she also has a continuing issue with  her left plantar foot. This is what we have in the past treated her for. We have not seen her since April 7. At that time she was doing okay on her left foot and no other wounds. Subsequently following that she ended up in the hospital and was not doing nearly as well she is much weaker at this point. With that being said again she does have the foot ulcer. She also has evidence again of wounds on the right lower extremity and again I do believe she has chronic venous stasis here she is on fluid pill but nonetheless is still having a lot of fluid leakage which I think is leading to some of the wounds that were experiencing seeing at this point. She is seen with her brother in the office today. This is the first time of actually seeing him with her. Patient History Information obtained from Patient. Allergies No Known Drug Allergies Family History Diabetes - Mother,Father, Heart Disease - Mother,Father, Hypertension - Mother, No family history of Cancer, Hereditary Spherocytosis, Kidney Disease, Lung Disease, Seizures, Stroke, Thyroid Problems, Tuberculosis. Social History MERARY, GARGUILO ([id number]) Current some day smoker, Marital Status - Widowed, Alcohol Use - Never, Drug Use - No History, Caffeine Use - Daily. Medical History Eyes Patient has history of Cataracts Denies history of Glaucoma, Optic Neuritis Ear/Nose/Mouth/Throat Denies history of Chronic sinus problems/congestion, Middle ear problems Hematologic/Lymphatic Denies history of Anemia, Hemophilia, Human Immunodeficiency Virus, Lymphedema, Sickle Cell Disease Respiratory Denies history of Aspiration, Asthma, Chronic Obstructive Pulmonary Disease (COPD), Pneumothorax, Sleep Apnea, Tuberculosis Cardiovascular Patient has history of Arrhythmia - afib, Coronary Artery Disease, Hypertension, Myocardial Infarction, Peripheral Venous Disease Denies history of Angina, Congestive Heart Failure, Deep Vein Thrombosis, Hypotension,  Phlebitis, Vasculitis Gastrointestinal Denies history of Cirrhosis , Colitis, Crohn s, Hepatitis A, Hepatitis B, Hepatitis C Endocrine Patient has history of  Type II Diabetes - IDDM Denies history of Type I Diabetes Genitourinary Denies history of End Stage Renal Disease Immunological Denies history of Lupus Erythematosus, Raynaud s, Scleroderma Integumentary (Skin) Denies history of History of Burn, History of pressure wounds Musculoskeletal Patient has history of Osteoarthritis - knee bilateral, Osteomyelitis Denies history of Gout, Rheumatoid Arthritis Neurologic Patient has history of Neuropathy - peripheral Denies history of Dementia, Quadriplegia, Paraplegia, Seizure Disorder Psychiatric Denies history of Anorexia/bulimia, Confinement Anxiety Patient is treated with Insulin. Blood sugar is tested. Blood sugar results noted at the following times: Breakfast - didn't do today. Medical And Surgical History Notes Cardiovascular ischemic cardiomyopathy, hyperlipidemia Review of Systems (ROS) Constitutional Symptoms (General Health) Denies complaints or symptoms of Fatigue, Fever, Chills, Marked Weight Change. Ear/Nose/Mouth/Throat Denies complaints or symptoms of Difficult clearing ears, Sinusitis. Respiratory Complains or has symptoms of Shortness of Breath. Cardiovascular Complains or has symptoms of LE edema. Gastrointestinal Denies complaints or symptoms of Frequent diarrhea, Nausea, Vomiting, GERD Genitourinary overactive bladder CKD-hx stage 3 Immunological Denies complaints or symptoms of Hives, Itching. Psychiatric Complains or has symptoms of Anxiety. Objective Constitutional sitting or standing blood pressure is within target range for patient.. pulse regular and within target range for patient.Marland Kitchen respirations regular, non- labored and within target range for patient.Marland Kitchen temperature within target range for patient.. Well-nourished and well-hydrated in no acute  distress. Vitals Time Taken: 10:13 AM, Height: 68 in, Source: Stated, Weight: 244 lbs, Source: Stated, BMI: 37.1, Temperature: 97.9 F, Pulse: 104 bpm, Respiratory Rate: 18 breaths/min, Blood Pressure: 124/84 mmHg. General Notes: unable to safely stand on scales at this time KALSEY, LULL. ([id number]) Eyes conjunctiva clear no eyelid edema noted. pupils equal round and reactive to light and accommodation. Ears, Nose, Mouth, and Throat no gross abnormality of ear auricles or external auditory canals. normal hearing noted during conversation. mucus membranes moist. Respiratory normal breathing without difficulty. Cardiovascular 1+ dorsalis pedis/posterior tibialis pulses. no clubbing, cyanosis, significant edema, Musculoskeletal Patient unable to walk without assistance. no significant deformity or arthritic changes, no loss or range of motion, no clubbing. Psychiatric this patient is able to make decisions and demonstrates good insight into disease process. Alert and Oriented x 3. pleasant and cooperative. General Notes: Upon inspection patient's wound bed actually showed signs of needing debridement both in regard to the right heel as well as the left plantar foot. I perform debridement of callus and subcutaneous tissue as much as possible although I had to be careful she is on Eliquis as a blood thinner and subsequently what this means is that she is obviously very prone to bleeding. That is something that I was very cognizant of and careful of fortunately there was no excessive bleeding we able to control with light pressure to achieve hemostasis. Integumentary (Hair, Skin) Wound #6 status is Open. Original cause of wound was Gradually Appeared. The date acquired was: 08/29/2020. The wound is located on the Right,Lateral,Anterior Lower Leg. The wound measures 2cm length x 5.5cm width x 0.1cm depth; 8.639cm^2 area and 0.864cm^3 volume. There is Fat Layer (Subcutaneous Tissue) exposed. There  is no tunneling or undermining noted. There is a medium amount of serous drainage noted. There is medium (34-66%) red, pink granulation within the wound bed. There is a medium (34-66%) amount of necrotic tissue within the wound bed including Adherent Slough. Wound #7 status is Open. Original cause of wound was Gradually Appeared. The date acquired was: 08/29/2020. The wound is located on the Right,Lateral Calcaneus. The wound measures 1.3cm length x  1.7cm width x 0.3cm depth; 1.736cm^2 area and 0.521cm^3 volume. There is Fat Layer (Subcutaneous Tissue) exposed. There is no tunneling or undermining noted. There is a medium amount of serosanguineous drainage noted. There is small (1-33%) granulation within the wound bed. There is a large (67-100%) amount of necrotic tissue within the wound bed including Eschar and Adherent Slough. Wound #8 status is Open. Original cause of wound was Gradually Appeared. The date acquired was: 05/01/2017. The wound is located on the Left,Plantar Metatarsal head first. The wound measures 0.6cm length x 0.3cm width x 0.4cm depth; 0.141cm^2 area and 0.057cm^3 volume. There is Fat Layer (Subcutaneous Tissue) exposed. There is no tunneling or undermining noted. There is a medium amount of serosanguineous drainage noted. There is large (67-100%) red granulation within the wound bed. There is a small (1-33%) amount of necrotic tissue within the wound bed including Adherent Slough. Wound #9 status is Open. Original cause of wound was Gradually Appeared. The date acquired was: 10/05/2020. The wound is located on the Right,Medial Lower Leg. The wound measures 2.5cm length x 3.2cm width x 0.2cm depth; 6.283cm^2 area and 1.257cm^3 volume. There is Fat Layer (Subcutaneous Tissue) exposed. There is no tunneling or undermining noted. There is a medium amount of serous drainage noted. There is large (67-100%) red, pink granulation within the wound bed. There is no necrotic tissue within the  wound bed. Assessment Active Problems ICD-10 Type 2 diabetes mellitus with foot ulcer Non-pressure chronic ulcer of other part of left foot with fat layer exposed Venous insufficiency (chronic) (peripheral) Pressure ulcer of right heel, unstageable Non-pressure chronic ulcer of other part of right lower leg with fat layer exposed Other specified peripheral vascular diseases Paroxysmal atrial fibrillation Chronic combined systolic (congestive) and diastolic (congestive) heart failure Cardiomyopathy in diseases classified elsewhere Long term (current) use of anticoagulants Corns and callosities Procedures Wound #7 Pre-procedure diagnosis of Wound #7 is a Pressure Ulcer located on the Right,Lateral Calcaneus . There was a Excisional Skin/Subcutaneous Tissue Debridement with a total area of 2.21 sq cm performed by Tommie Sams., [name]. With the following instrument(s): Curette, and Scissors to MARQUASIA, SCHMIEDER. ([id number]) remove Viable and Non-Viable tissue/material. Material removed includes Eschar, Subcutaneous Tissue, and Slough. A time out was conducted at 11:00, prior to the start of the procedure. A Moderate amount of bleeding was controlled with Pressure. The procedure was tolerated well. Post Debridement Measurements: 1.3cm length x 1.7cm width x 1cm depth; 1.736cm^3 volume. Post debridement Stage noted as Category/Stage III. Character of Wound/Ulcer Post Debridement is stable. Post procedure Diagnosis Wound #7: Same as Pre-Procedure Wound #8 Pre-procedure diagnosis of Wound #8 is a Diabetic Wound/Ulcer of the Lower Extremity located on the Left,Plantar Metatarsal head first .Severity of Tissue Pre Debridement is: Fat layer exposed. There was a Excisional Skin/Subcutaneous Tissue Debridement with a total area of 2.25 sq cm performed by Tommie Sams., [name]. With the following instrument(s): Curette to remove Viable and Non-Viable tissue/material. Material removed includes Callus,  Subcutaneous Tissue, and Slough. A time out was conducted at 11:00, prior to the start of the procedure. A Minimum amount of bleeding was controlled with Pressure. The procedure was tolerated well. Post Debridement Measurements: 1.2cm length x 0.6cm width x 0.2cm depth; 0.113cm^3 volume. Character of Wound/Ulcer Post Debridement is stable. Severity of Tissue Post Debridement is: Fat layer exposed. Post procedure Diagnosis Wound #8: Same as Pre-Procedure Plan Follow-up Appointments: Return Appointment in 1 week. Bathing/ Shower/ Hygiene: May shower with wound dressing protected with water  repellent cover or cast protector. No tub bath. WOUND #6: - Lower Leg Wound Laterality: Right, Lateral, Anterior Cleanser: Soap and Water 1 x Per Week/15 Days Discharge Instructions: Gently cleanse wound with antibacterial soap, rinse and pat dry prior to dressing wounds Primary Dressing: Silvercel Small 2x2 (in/in) 1 x Per Week/15 Days Discharge Instructions: Apply Silvercel Small 2x2 (in/in) as instructed Secondary Dressing: ABD Pad 5x9 (in/in) 1 x Per Week/15 Days Discharge Instructions: Cover with ABD pad Secured With: Coban Cohesive Bandage 4x5 (yds) Stretched 1 x Per Week/15 Days Discharge Instructions: Apply coban LIGHTLY from base of toes to 2 fingers width below the knee to secure kerlix Secured With: [employer] or Non-Sterile 6-ply 4.5x4 (yd/yd) 1 x Per Week/15 Days Discharge Instructions: Apply Kerlix from base of toes to 2 fingers width below the knee WOUND #7: - Calcaneus Wound Laterality: Right, Lateral Cleanser: Soap and Water 1 x Per Week/15 Days Discharge Instructions: Gently cleanse wound with antibacterial soap, rinse and pat dry prior to dressing wounds Primary Dressing: Iodosorb 40 (g) 1 x Per Week/15 Days Discharge Instructions: Apply IodoSorb to wound bed only as directed. Secondary Dressing: ABD Pad 5x9 (in/in) 1 x Per Week/15 Days Discharge Instructions: Cover with ABD  pad Secured With: Coban Cohesive Bandage 4x5 (yds) Stretched 1 x Per Week/15 Days Discharge Instructions: Apply coban LIGHTLY from base of toes to 2 fingers width below the knee to secure kerlix Secured With: [employer] or Non-Sterile 6-ply 4.5x4 (yd/yd) 1 x Per Week/15 Days Discharge Instructions: Apply Kerlix from base of toes to 2 fingers width below the knee WOUND #8: - Metatarsal head first Wound Laterality: Plantar, Left Cleanser: Soap and Water 3 x Per Week/30 Days Discharge Instructions: Gently cleanse wound with antibacterial soap, rinse and pat dry prior to dressing wounds Primary Dressing: Silvercel Small 2x2 (in/in) (DME) (Generic) 3 x Per Week/30 Days Discharge Instructions: Apply Silvercel Small 2x2 (in/in) as instructed Secondary Dressing: ABD Pad 5x9 (in/in) (DME) (Generic) 3 x Per Week/30 Days Discharge Instructions: Cover with ABD pad Secondary Dressing: Conforming Guaze Roll-Large (DME) (Generic) 3 x Per Week/30 Days Discharge Instructions: Apply Conforming Stretch Guaze Bandage as directed Secured With: 75M Medipore H Soft Cloth Surgical Tape, 2x2 (in/yd) (DME) (Generic) 3 x Per Week/30 Days WOUND #9: - Lower Leg Wound Laterality: Right, Medial Cleanser: Soap and Water 1 x Per Week/15 Days Discharge Instructions: Gently cleanse wound with antibacterial soap, rinse and pat dry prior to dressing wounds Primary Dressing: Silvercel Small 2x2 (in/in) 1 x Per Week/15 Days Discharge Instructions: Apply Silvercel Small 2x2 (in/in) as instructed Secondary Dressing: ABD Pad 5x9 (in/in) 1 x Per Week/15 Days Discharge Instructions: Cover with ABD pad Secured With: Coban Cohesive Bandage 4x5 (yds) Stretched 1 x Per Week/15 Days Discharge Instructions: Apply coban LIGHTLY from base of toes to 2 fingers width below the knee to secure kerlix Secured With: [employer] or Non-Sterile 6-ply 4.5x4 (yd/yd) 1 x Per Week/15 Days Discharge Instructions: Apply Kerlix from base  of toes to 2 fingers width below the knee 1. Would recommend currently that we actually go ahead and initiate treatment with a silver alginate dressing premature across the board at all locations as far as the wounds are concerned. IVELISE, CASTILLO ([id number]) 2. The 1 caveat is going to be the heel where we can use Iodoflex/Iodosorb here to try to help clean up the wound bed at this point. 3. I am also can suggest offloading of the heel and she needs  to try to avoid walking in general as far as the wounds are concerned. 4. With regard to her falling and weakness she is looking into going into rehab to try to build back up. With that being said nothing is set in stone in that regard right now. We will see patient back for reevaluation in 1 week here in the clinic. If anything worsens or changes patient will contact our office for additional recommendations. Electronic Signature(s) Signed: 10/05/2020 5:52:37 PM By: [name] [name] Entered By: [name] on 10/05/2020 17:52:37 Melinda [name] ([id number]) -------------------------------------------------------------------------------- ROS/PFSH Details Patient Name: Melinda [name] Date of Service: 10/05/2020 10:00 AM Medical Record Number: [id number] Patient Account Number: [id number] Date of Birth/Sex: [birth year] (64 y.o. F) Treating RN: Donnamarie Poag Primary Care Provider: [name] Other Clinician: Referring Provider: [name] Treating Provider/Extender: Skipper Cliche in Treatment: 0 Information Obtained From Patient Constitutional Symptoms (General Health) Complaints and Symptoms: Negative for: Fatigue; Fever; Chills; Marked Weight Change Ear/Nose/Mouth/Throat Complaints and Symptoms: Negative for: Difficult clearing ears; Sinusitis Medical History: Negative for: Chronic sinus problems/congestion; Middle ear problems Respiratory Complaints and Symptoms: Positive for: Shortness of Breath Medical History: Negative  for: Aspiration; Asthma; Chronic Obstructive Pulmonary Disease (COPD); Pneumothorax; Sleep Apnea; Tuberculosis Cardiovascular Complaints and Symptoms: Positive for: LE edema Medical History: Positive for: Arrhythmia - afib; Coronary Artery Disease; Hypertension; Myocardial Infarction; Peripheral Venous Disease Negative for: Angina; Congestive Heart Failure; Deep Vein Thrombosis; Hypotension; Phlebitis; Vasculitis Past Medical History Notes: ischemic cardiomyopathy, hyperlipidemia Gastrointestinal Complaints and Symptoms: Negative for: Frequent diarrhea; Nausea; Vomiting Review of System Notes: GERD Medical History: Negative for: Cirrhosis ; Colitis; Crohnos; Hepatitis A; Hepatitis B; Hepatitis C Immunological Complaints and Symptoms: Negative for: Hives; Itching Medical History: Negative for: Lupus Erythematosus; Raynaudos; Scleroderma Psychiatric Complaints and Symptoms: Positive for: Anxiety Medical History: Negative for: Anorexia/bulimia; Confinement Anxiety TERIANNA, PEGGS E. ([id number]) Eyes Medical History: Positive for: Cataracts Negative for: Glaucoma; Optic Neuritis Hematologic/Lymphatic Medical History: Negative for: Anemia; Hemophilia; Human Immunodeficiency Virus; Lymphedema; Sickle Cell Disease Endocrine Medical History: Positive for: Type II Diabetes - IDDM Negative for: Type I Diabetes Time with diabetes: 2 years Treated with: Insulin Blood sugar tested every day: Yes Tested : daily Blood sugar testing results: Breakfast: didn't do today Genitourinary Complaints and Symptoms: Review of System Notes: overactive bladder CKD-hx stage 3 Medical History: Negative for: End Stage Renal Disease Integumentary (Skin) Medical History: Negative for: History of Burn; History of pressure wounds Musculoskeletal Medical History: Positive for: Osteoarthritis - knee bilateral; Osteomyelitis Negative for: Gout; Rheumatoid Arthritis Neurologic Medical  History: Positive for: Neuropathy - peripheral Negative for: Dementia; Quadriplegia; Paraplegia; Seizure Disorder Oncologic HBO Extended History Items Eyes: Cataracts Immunizations Pneumococcal Vaccine: Received Pneumococcal Vaccination: No Implantable Devices None Family and Social History Cancer: No; Diabetes: Yes - Mother,Father; Heart Disease: Yes - Mother,Father; Hereditary Spherocytosis: No; Hypertension: Yes - Mother; Kidney Disease: No; Lung Disease: No; Seizures: No; Stroke: No; Thyroid Problems: No; Tuberculosis: No; Current some day smoker; Marital Status - Widowed; Alcohol Use: Never; Drug Use: No History; Caffeine Use: Daily; Financial Concerns: No; Food, Clothing or Shelter Needs: No; Support System Lacking: No; Transportation Concerns: No CALEAH, TORTORELLI ([id number]) Electronic Signature(s) Signed: 10/05/2020 1:59:03 PM By: Donnamarie Poag Signed: 10/05/2020 6:18:59 PM By: [name] [name] Entered By: Donnamarie Poag on 10/05/2020 10:19:35 Melinda [name] ([id number]) -------------------------------------------------------------------------------- SuperBill Details Patient Name: Melinda [name] Date of Service: 10/05/2020 Medical Record Number: [id number] Patient Account Number: [id number] Date of Birth/Sex: [birth year] (64 y.o. F) Treating  RN: Dolan Amen Primary Care Provider: [name] Other Clinician: Referring Provider: [name] Treating Provider/Extender: Skipper Cliche in Treatment: 0 Diagnosis Coding ICD-10 Codes Code Description E11.621 Type 2 diabetes mellitus with foot ulcer 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sed I87.2 Venous insufficiency (chronic) (peripheral) L89.610 Pressure ulcer of right heel, unstageable L97.812 Non-pressure chronic ulcer of other part of right lower leg with fat layer exposed I73.89 Other specified peripheral vascular diseases I48.0 Paroxysmal atrial fibrillation I50.42  Chronic combined systolic (congestive) and diastolic (congestive) heart failure I43 Cardiomyopathy in diseases classified elsewhere Z79.01 Long term (current) use of anticoagulants L84 Corns and callosities Facility Procedures CPT4 Code: 09811914 Description: 78295 - WOUND CARE VISIT-LEV 2 EST PT Modifier: Quantity: 1 CPT4 Code: [id number] Description: 78469 - DEB SUBQ TISSUE 20 SQ CM/< Modifier: Quantity: 1 CPT4 Code: Description: ICD-10 Diagnosis Description 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 L89.610 Pressure ulcer of right heel, unstageable Modifier: sed Quantity: Physician Procedures CPT4 Code: 6295284 Description: 99213 - WC PHYS LEVEL 3 - EST PT Modifier: 25 Quantity: 1 CPT4 Code: Description: ICD-10 Diagnosis Description E11.621 Type 2 diabetes mellitus with foot ulcer L97.522 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

## 2020-10-07 ENCOUNTER — Other Ambulatory Visit: Payer: Self-pay

## 2020-10-07 ENCOUNTER — Emergency Department: Payer: Medicare HMO

## 2020-10-07 ENCOUNTER — Emergency Department
Admission: EM | Admit: 2020-10-07 | Discharge: 2020-10-08 | Disposition: A | Discharge status: Home / Self Care | Payer: Medicare HMO | Attending: Emergency Medicine | Admitting: Emergency Medicine

## 2020-10-07 DIAGNOSIS — L97419 Non-pressure chronic ulcer of right heel and midfoot with unspecified severity: Secondary | ICD-10-CM | POA: Diagnosis not present

## 2020-10-07 DIAGNOSIS — N1831 Chronic kidney disease, stage 3a: Secondary | ICD-10-CM | POA: Insufficient documentation

## 2020-10-07 DIAGNOSIS — Z87891 Personal history of nicotine dependence: Secondary | ICD-10-CM | POA: Insufficient documentation

## 2020-10-07 DIAGNOSIS — Z7901 Long term (current) use of anticoagulants: Secondary | ICD-10-CM | POA: Diagnosis not present

## 2020-10-07 DIAGNOSIS — Z951 Presence of aortocoronary bypass graft: Secondary | ICD-10-CM | POA: Diagnosis not present

## 2020-10-07 DIAGNOSIS — E11621 Type 2 diabetes mellitus with foot ulcer: Secondary | ICD-10-CM | POA: Diagnosis not present

## 2020-10-07 DIAGNOSIS — E1122 Type 2 diabetes mellitus with diabetic chronic kidney disease: Secondary | ICD-10-CM | POA: Insufficient documentation

## 2020-10-07 DIAGNOSIS — I2510 Atherosclerotic heart disease of native coronary artery without angina pectoris: Secondary | ICD-10-CM | POA: Insufficient documentation

## 2020-10-07 DIAGNOSIS — R2243 Localized swelling, mass and lump, lower limb, bilateral: Secondary | ICD-10-CM | POA: Diagnosis present

## 2020-10-07 DIAGNOSIS — I130 Hypertensive heart and chronic kidney disease with heart failure and stage 1 through stage 4 chronic kidney disease, or unspecified chronic kidney disease: Secondary | ICD-10-CM | POA: Insufficient documentation

## 2020-10-07 DIAGNOSIS — Z79899 Other long term (current) drug therapy: Secondary | ICD-10-CM | POA: Insufficient documentation

## 2020-10-07 DIAGNOSIS — R0602 Shortness of breath: Secondary | ICD-10-CM | POA: Insufficient documentation

## 2020-10-07 DIAGNOSIS — Z794 Long term (current) use of insulin: Secondary | ICD-10-CM | POA: Insufficient documentation

## 2020-10-07 DIAGNOSIS — R600 Localized edema: Secondary | ICD-10-CM | POA: Insufficient documentation

## 2020-10-07 DIAGNOSIS — I5022 Chronic systolic (congestive) heart failure: Secondary | ICD-10-CM | POA: Insufficient documentation

## 2020-10-07 DIAGNOSIS — R609 Edema, unspecified: Secondary | ICD-10-CM

## 2020-10-07 LAB — CBC
HCT: 28.7 % — ABNORMAL LOW (ref 36.0–46.0)
Hemoglobin: 9.6 g/dL — ABNORMAL LOW (ref 12.0–15.0)
MCH: 25.1 pg — ABNORMAL LOW (ref 26.0–34.0)
MCHC: 33.4 g/dL (ref 30.0–36.0)
MCV: 74.9 fL — ABNORMAL LOW (ref 80.0–100.0)
Platelets: 256 10*3/uL (ref 150–400)
RBC: 3.83 MIL/uL — ABNORMAL LOW (ref 3.87–5.11)
RDW: 20.2 % — ABNORMAL HIGH (ref 11.5–15.5)
WBC: 7.9 10*3/uL (ref 4.0–10.5)
nRBC: 0 % (ref 0.0–0.2)

## 2020-10-07 LAB — BASIC METABOLIC PANEL
Anion gap: 7 (ref 5–15)
BUN: 15 mg/dL (ref 8–23)
CO2: 25 mmol/L (ref 22–32)
Calcium: 7.8 mg/dL — ABNORMAL LOW (ref 8.9–10.3)
Chloride: 107 mmol/L (ref 98–111)
Creatinine, Ser: 1.24 mg/dL — ABNORMAL HIGH (ref 0.44–1.00)
GFR, Estimated: 48 mL/min — ABNORMAL LOW (ref >60)
Glucose, Bld: 88 mg/dL (ref 70–99)
Potassium: 3 mmol/L — ABNORMAL LOW (ref 3.5–5.1)
Sodium: 139 mmol/L (ref 135–145)

## 2020-10-07 LAB — TROPONIN I (HIGH SENSITIVITY)
Troponin I (High Sensitivity): 7 ng/L (ref <18)
Troponin I (High Sensitivity): 6 ng/L (ref <18)

## 2020-10-07 LAB — BRAIN NATRIURETIC PEPTIDE: B Natriuretic Peptide: 579.3 pg/mL — ABNORMAL HIGH (ref 0.0–100.0)

## 2020-10-07 MED ORDER — FUROSEMIDE 10 MG/ML IJ SOLN
40.0000 mg | Freq: Once | INTRAMUSCULAR | Status: AC
  Administered 2020-10-07: 40 mg via INTRAVENOUS
  Filled 2020-10-07: qty 4

## 2020-10-07 MED ORDER — POTASSIUM CHLORIDE CRYS ER 20 MEQ PO TBCR
40.0000 meq | EXTENDED_RELEASE_TABLET | Freq: Once | ORAL | Status: AC
  Administered 2020-10-07: 40 meq via ORAL
  Filled 2020-10-07: qty 2

## 2020-10-07 NOTE — ED Triage Notes (Signed)
Pt with c/o right leg swelling, pt states she has CHF and she takes fluid pills. Pt states she is still taking pills but the fluid is building up in her right leg. Pt with ace wrap to right leg. Pt states she is SOB. Pt state she has ulcer on right heel.

## 2020-10-07 NOTE — ED Provider Notes (Signed)
[facility] Emergency Department Provider Note   ____________________________________________   Event Date/Time   First MD Initiated Contact with Patient 10/07/20 2215     (approximate)  I have reviewed the triage vital signs and the nursing notes.   HISTORY  Chief Complaint Shortness of Breath    HPI Melinda [name] is a 65 y.o. female with past medical history of hypertension, hyperlipidemia, diabetes, CAD, atrial fibrillation on Eliquis, CKD, CHF, and peripheral vascular disease who presents to the ED complaining of shortness of breath.  Patient reports that she has noticed increasing swelling in both of her legs, right greater than left, for at least the past week.  This is been associated with mild shortness of breath, which patient states was worse earlier this evening but now seems to have improved.  She denies any associated fevers, cough, or chest pain.  She does state that she has had some discomfort in her right leg, does have a known ulcer to her right heel for which she follows with the wound care clinic.  She has a dressing in place and patient's brother states that the appearance of the wound has been gradually improving.  She has not had any fevers or redness to her leg, denies any recent drainage from the wound.        Past Medical History:  Diagnosis Date   Anxiety    Atrial fibrillation (HCC)    Atrial flutter ([place])    Coronary artery disease    a. 06/2014 NSTEMI s/p CABG x 4 (LIMA to LAD, VG to Diag, VG to OM, VG to PDA).   Depression    Diabetic neuropathy (HCC)    GERD (gastroesophageal reflux disease)    HTN (hypertension)    Hyperlipidemia LDL goal <70    IDDM (insulin dependent diabetes mellitus)    Ischemic cardiomyopathy    a. 06/2014 Echo: EF 40-45%, HK of entire inferolateral and inferior myocardium c/w infarct of RCA/LCx, GR2DD, mild MR   Myocardial infarction ([place]) 2016   OA (osteoarthritis) of knee    Obesity     Osteomyelitis ([place])    Peripheral neuropathy    Peripheral vascular disease ([place])    a. 09/2016 Periph Angio: CTO R popliteal, CTO L prox/mid SFA->Med Rx; b. 05/2017 PTA: LSFA (Viabahn covered stent x 2), DEB to L Post Tibial; c. 06/2017 ABI: R 0.44, L 1.05.   Tobacco abuse     Patient Active Problem List   Diagnosis Date Noted   Recurrent falls 09/02/2020   Unspecified protein-calorie malnutrition ([place]) 08/26/2020   Wound of left foot 08/26/2020   AF (paroxysmal atrial fibrillation) ([place])    Fall 08/22/2020   Atrial fibrillation with RVR ([place]) 08/22/2020   CKD (chronic kidney disease), stage IIIa 08/22/2020   Depression 81/19/1478   Chronic systolic CHF (congestive heart failure) ([place]) 08/22/2020   Iron deficiency anemia 08/22/2020   Atrial flutter with rapid ventricular response (HCC)    Acute respiratory failure with hypoxia ([place]) 08/11/2020   S/P gastric bypass 02/09/2020   Severe recurrent major depression without psychotic features ([place]) 03/12/2018   Cocaine abuse ([place]) 03/12/2018   Suicidal ideation 03/12/2018   Atherosclerosis of native arteries of the extremities with ulceration ([place]) 06/19/2017   Gangrene of toe of left foot ([place]) 05/14/2017   CAD (coronary artery disease), native coronary artery 05/05/2017   Claudication in peripheral vascular disease ([place]) 10/18/2016   Hyperlipidemia LDL goal <70 10/18/2016   Onychomycosis 10/10/2016   Encounters  for administrative purpose 10/10/2016   Amputated toe, left ([place]) 10/10/2016   Amputated toe, right ([place]) 10/10/2016   Osteomyelitis of left foot ([place]) 10/21/2015   Hyperglycemia 10/02/2015   Gastroesophageal reflux disease 11/16/2014   Generalized ischemic myocardial dysfunction 07/07/2014   Peripheral neuropathy    Tobacco abuse    Obesity    S/P CABG x 4 06/12/2014   Chronic coronary artery disease 06/08/2014   Uncontrolled secondary diabetes mellitus with stage 3 CKD (GFR 30-59) (HCC)    NSTEMI (non-ST elevated  myocardial infarction) ([place]) 06/03/2014   Essential hypertension 09/11/2012   Diabetes mellitus type 2, insulin dependent ([place]) 09/11/2012    Past Surgical History:  Procedure Laterality Date   ABDOMINAL AORTOGRAM W/LOWER EXTREMITY N/A 10/27/2016   Procedure: Abdominal Aortogram w/Lower Extremity;  Surgeon: Nelva Bush, MD;  Location: [place] [facility];  Service: Cardiovascular;  Laterality: N/A;   AMPUTATION TOE Left 10/21/2015   Procedure: AMPUTATION TOE;  Surgeon: Sharlotte Alamo, DPM;  Location: [facility];  Service: Podiatry;  Laterality: Left;   AMPUTATION TOE Left 05/15/2017   Procedure: AMPUTATION LEFT GREAT TOE;  Surgeon: Sharlotte Alamo, DPM;  Location: [facility];  Service: Podiatry;  Laterality: Left;   AORTIC VALVE REPLACEMENT (AVR)/CORONARY ARTERY BYPASS GRAFTING (CABG)     BREAST BIOPSY     CARDIAC CATHETERIZATION  06/2014   95% stenosis mLAD, occlusion ostial OM1, 70% stenosis LCx, 95% stenosis mRCA, EF 45%.   CARDIOVERSION N/A 04/14/2020   Procedure: CARDIOVERSION with TEE;  Surgeon: Minna Merritts, MD;  Location: [facility];  Service: Cardiovascular;  Laterality: N/A;   CARDIOVERSION N/A 05/28/2020   Procedure: CARDIOVERSION;  Surgeon: Minna Merritts, MD;  Location: [facility];  Service: Cardiovascular;  Laterality: N/A;   CORONARY ARTERY BYPASS GRAFT N/A 06/08/2014   Procedure: CORONARY ARTERY BYPASS GRAFTING (CABG);  Surgeon: Grace Isaac, MD;  Location: [place];  Service: Open Heart Surgery;  Laterality: N/A;  Times 4 using left internal mammary artery to LAD artery and endoscopically harvested bilateral saphenous vein to Obtuse Marginal, Diagonal and Posterior Descending coronary arteries.   CT ABD W & PELVIS WO CM  06/2014   nl liver, gallbladder, spleen, mild diverticular changes, no bowel wall inflammation, appendix nl, no hernia, no other sig abnormalities   GASTRIC ROUX-EN-Y N/A 02/09/2020   Procedure: LAPAROSCOPIC ROUX-EN-Y GASTRIC BYPASS WITH UPPER ENDOSCOPY;  Surgeon:  Johnathan Hausen, MD;  Location: [facility];  Service: General;  Laterality: N/A;   LOWER EXTREMITY ANGIOGRAPHY Left 05/23/2017   Procedure: LOWER EXTREMITY ANGIOGRAPHY;  Surgeon: Algernon Huxley, MD;  Location: [facility];  Service: Cardiovascular;  Laterality: Left;   LOWER EXTREMITY ANGIOGRAPHY Left 05/28/2017   Procedure: LOWER EXTREMITY ANGIOGRAPHY;  Surgeon: Algernon Huxley, MD;  Location: [facility];  Service: Cardiovascular;  Laterality: Left;   LOWER EXTREMITY ANGIOGRAPHY Left 12/16/2018   Procedure: LOWER EXTREMITY ANGIOGRAPHY;  Surgeon: Algernon Huxley, MD;  Location: [facility];  Service: Cardiovascular;  Laterality: Left;   LOWER EXTREMITY ANGIOGRAPHY Right 12/23/2018   Procedure: LOWER EXTREMITY ANGIOGRAPHY;  Surgeon: Algernon Huxley, MD;  Location: [facility];  Service: Cardiovascular;  Laterality: Right;   TEE WITHOUT CARDIOVERSION N/A 06/08/2014   Procedure: TRANSESOPHAGEAL ECHOCARDIOGRAM (TEE);  Surgeon: Grace Isaac, MD;  Location: [facility];  Service: Open Heart Surgery;  Laterality: N/A;   TEE WITHOUT CARDIOVERSION N/A 04/14/2020   Procedure: TRANSESOPHAGEAL ECHOCARDIOGRAM (TEE);  Surgeon: Minna Merritts, MD;  Location: [facility];  Service: Cardiovascular;  Laterality: N/A;   TOE AMPUTATION Right 12/2011   rt middle toe   UPPER GI ENDOSCOPY N/A 02/09/2020   Procedure: UPPER GI ENDOSCOPY;  Surgeon: Johnathan Hausen, MD;  Location[facility];  Service: General;  Laterality: N/A;   US ECHOCARDIOGRAPHY  06/2014   EF 50-55%, HK of inf/post/inferolat walls, Ao sclerosis    Prior to Admission medications   Medication Sig Start Date End Date Taking? Authorizing Provider  ACCU-CHEK AVIVA PLUS test strip 1 each 3 (three) times daily. Patient not taking: Reported on 08/27/2020 12/31/19   [provider]  Accu-Chek Softclix Lancets lancets 1 each 3 (three) times daily. Patient not taking: Reported on 08/27/2020 01/13/20   [provider]  Alcohol Swabs  (B-D SINGLE USE SWABS REGULAR) PADS Apply topically. Patient not taking: Reported on 08/27/2020 01/21/20   [provider]  amiodarone (PACERONE) 200 MG tablet Take 1 tablet (200 mg total) by mouth 2 (two) times daily. 09/03/20   Hendricks Limes, MD  apixaban (ELIQUIS) 5 MG TABS tablet Take 1 tablet (5 mg total) by mouth 2 (two) times daily. 09/03/20   Hendricks Limes, MD  atorvastatin (LIPITOR) 80 MG tablet Take 1 tablet (80 mg total) by mouth daily at 6 PM. 09/03/20   Hendricks Limes, MD  Cholecalciferol (VITAMIN D3) 50 MCG (2000 UT) TABS Take 2,000 Units by mouth daily.    [provider]  clotrimazole (LOTRIMIN) 1 % cream Apply  as directed to affected area twice a day  for fungal infection 06/14/20   [provider]  diltiazem (CARDIZEM) 60 MG tablet Take 1 tablet (60 mg total) by mouth 2 (two) times daily. 09/03/20   Hendricks Limes, MD  docusate sodium (COLACE) 100 MG capsule Take 100 mg by mouth 2 (two) times daily.    [provider]  DROPLET PEN NEEDLES 31G X 6 MM [place]  11/10/19   [provider]  DULoxetine (CYMBALTA) 60 MG capsule Take 1 capsule (60 mg total) by mouth daily. 09/03/20   Hendricks Limes, MD  Emollient (CERAVE EX) Apply 1 application topically in the morning and at bedtime.    [provider]  furosemide (LASIX) 20 MG tablet Take 2 tablets (40 mg total) by mouth daily as needed (fluid retention.). 09/03/20   Hendricks Limes, MD  insulin aspart (NOVOLOG) 100 UNIT/ML injection Inject 4 Units into the skin 3 (three) times daily with meals. 08/25/20   Loletha Grayer, MD  insulin glargine (LANTUS) 100 UNIT/ML injection Inject 0.06 mLs (6 Units total) into the skin at bedtime. 09/03/20   Hendricks Limes, MD  lidocaine (LIDODERM) 5 % Place 1 patch onto the skin daily as needed (PAIN.).  02/24/19   [provider]  metoprolol tartrate (LOPRESSOR) 50 MG tablet TAKE 1 TABLET TWICE DAILY 09/24/20   Vickie Epley, MD   Multiple Vitamins-Minerals (MULTIVITAMIN WITH MINERALS) tablet Take 1 tablet by mouth in the morning and at bedtime.    [provider]  NON FORMULARY Med Pass Give 141ml po BID    [provider]  oxybutynin (DITROPAN) 5 MG tablet Take 5 mg by mouth 2 (two) times daily.  05/10/17   [provider]  pantoprazole (PROTONIX) 40 MG tablet Take 1 tablet (40 mg total) by mouth daily. 09/03/20   Hendricks Limes, MD  potassium chloride SA (KLOR-CON) 20 MEQ tablet TAKE 1 TABLET (20 MEQ TOTAL) BY MOUTH DAILY AS NEEDED. 09/03/20  Hendricks Limes, MD  pregabalin (LYRICA) 100 MG capsule Take 1 capsule (100 mg total) by mouth 3 (three) times daily. 09/08/20   Medina-Vargas, Monina C, NP  tiZANidine (ZANAFLEX) 4 MG capsule Take 1 capsule (4 mg total) by mouth 3 (three) times daily as needed for muscle spasms. 09/06/20   Medina-Vargas, Monina C, NP  varenicline (CHANTIX) 1 MG tablet Take 1 mg by mouth daily. Patient not taking: Reported on 09/02/2020 07/17/20   [provider]    Allergies Patient has no known allergies.  Family History  Problem Relation Age of Onset   CAD Mother    Hypertension Mother    Diabetes Mother    CAD Father    Diabetes Father    Sickle cell trait Other    Asthma Other     Social History Social History   Tobacco Use   Smoking status: Former    Packs/day: 0.50    Years: 40.00    Pack years: 20.00    Types: Cigarettes   Smokeless tobacco: Never   Tobacco comments:    started Chantix 04/12/20  Vaping Use   Vaping Use: Never used  Substance Use Topics   Alcohol use: No    Alcohol/week: 0.0 standard drinks   Drug use: Not Currently    Comment: crack    Review of Systems  Constitutional: No fever/chills Eyes: No visual changes. ENT: No sore throat. Cardiovascular: Denies chest pain. Respiratory: Positive for shortness of breath. Gastrointestinal: No abdominal pain.  No nausea, no vomiting.  No diarrhea.  No  constipation. Genitourinary: Negative for dysuria. Musculoskeletal: Negative for back pain.  Positive for leg swelling and pain. Skin: Negative for rash. Neurological: Negative for headaches, focal weakness or numbness.  ____________________________________________   PHYSICAL EXAM:  VITAL SIGNS: ED Triage Vitals  Enc Vitals Group     BP 10/07/20 1824 (!) 110/59     Pulse Rate 10/07/20 1824 96     Resp 10/07/20 1824 16     Temp 10/07/20 1824 98.3 F (36.8 C)     Temp Source 10/07/20 1824 Oral     SpO2 10/07/20 1824 100 %     Weight 10/07/20 1835 244 lb (110.7 kg)     Height 10/07/20 1835 5\' 8"  (1.727 m)     Head Circumference --      Peak Flow --      Pain Score 10/07/20 1834 7     Pain Loc --      Pain Edu? --      Excl. in [place]? --     Constitutional: Alert and oriented. Eyes: Conjunctivae are normal. Head: Atraumatic. Nose: No congestion/rhinnorhea. Mouth/Throat: Mucous membranes are moist. Neck: Normal ROM Cardiovascular: Normal rate, regular rhythm. Grossly normal heart sounds.  2+ radial pulses bilaterally. Respiratory: Normal respiratory effort.  No retractions. Lungs CTAB. Gastrointestinal: Soft and nontender. No distention. Genitourinary: deferred Musculoskeletal: No lower extremity tenderness, 2+ pitting edema to knees bilaterally.  Ulceration to right posterior heel with no associated erythema, warmth, or tenderness.  No purulent drainage noted. Neurologic:  Normal speech and language. No gross focal neurologic deficits are appreciated. Skin:  Skin is warm, dry and intact. No rash noted. Psychiatric: Mood and affect are normal. Speech and behavior are normal.  ____________________________________________   LABS (all labs ordered are listed, but only abnormal results are displayed)  Labs Reviewed  BASIC METABOLIC PANEL - Abnormal; Notable for the following components:      Result Value   Potassium  3.0 (*)    Creatinine, Ser 1.24 (*)    Calcium 7.8 (*)     GFR, Estimated 48 (*)    All other components within normal limits  CBC - Abnormal; Notable for the following components:   RBC 3.83 (*)    Hemoglobin 9.6 (*)    HCT 28.7 (*)    MCV 74.9 (*)    MCH 25.1 (*)    RDW 20.2 (*)    All other components within normal limits  BRAIN NATRIURETIC PEPTIDE - Abnormal; Notable for the following components:   B Natriuretic Peptide 579.3 (*)    All other components within normal limits  TROPONIN I (HIGH SENSITIVITY)  TROPONIN I (HIGH SENSITIVITY)   ____________________________________________  EKG  ED ECG REPORT I, Blake Divine, the attending physician, personally viewed and interpreted this ECG.   Date: 10/07/2020  EKG Time: 18:36  Rate: 103  Rhythm: Atrial flutter vs sinus tachycardia  Axis: LAD  Intervals:nonspecific intraventricular conduction delay  ST&T Change: None   PROCEDURES  Procedure(s) performed (including Critical Care):  Procedures   ____________________________________________   INITIAL IMPRESSION / ASSESSMENT AND PLAN / ED COURSE      65 year old female with past medical history of hypertension, hyperlipidemia, diabetes, CAD, CKD, CHF, and atrial fibrillation on Eliquis who presents to the ED complaining of 1 week of shortness of breath with increasing swelling in her legs.  Patient is not in any respiratory distress and is maintaining O2 sats on room air.  EKG shows no ischemic changes rhythm appears consistent with sinus tachycardia versus atrial flutter with 3-1 block.  Rate is currently reasonably controlled and patient already anticoagulated on Eliquis.  Labs are reassuring, chest x-ray reviewed by me and shows no infiltrate, edema, or effusion.  Given her lower extremity edema, patient appears to have mild CHF exacerbation and we will give dose of IV Lasix.  We will also replete her potassium.  Given she is stable from a respiratory perspective, she would be appropriate for discharge home with follow-up in  the heart failure clinic.  She additionally has a healing ulcer at her right heel, appears to be healing well with no signs of acute infection.  She has follow-up with the wound care clinic next week on Tuesday.  Patient counseled to return to the ED for new or worsening symptoms, patient agrees with plan.      ____________________________________________   FINAL CLINICAL IMPRESSION(S) / ED DIAGNOSES  Final diagnoses:  Peripheral edema  Diabetic ulcer of right heel associated with type 2 diabetes mellitus, unspecified ulcer stage [facility])     ED Discharge Orders          Ordered    AMB referral to CHF clinic        10/07/20 2337             Note:  This document was prepared using Dragon voice recognition software and may include unintentional dictation errors.    Blake Divine, MD 10/07/20 2348

## 2020-10-07 NOTE — ED Notes (Signed)
Report from [place], [place].

## 2020-10-07 NOTE — ED Notes (Signed)
Patient is resting comfortably. 

## 2020-10-07 NOTE — ED Notes (Signed)
Pt's right foot dressed with xeroform, kurlex and coban per EDP. Pt tolerated well.

## 2020-10-08 NOTE — ED Notes (Signed)
Pt provided discharge instructions and prescription information. Pt was given the opportunity to ask questions and questions were answered. Discharge signature not obtained in the setting of the COVID-19 pandemic in order to reduce high touch surfaces.  ° °

## 2020-10-12 ENCOUNTER — Other Ambulatory Visit: Payer: Self-pay

## 2020-10-12 ENCOUNTER — Encounter: Payer: Medicare HMO | Admitting: Physician Assistant

## 2020-10-12 DIAGNOSIS — E11621 Type 2 diabetes mellitus with foot ulcer: Secondary | ICD-10-CM | POA: Diagnosis not present

## 2020-10-12 NOTE — Progress Notes (Signed)
KARSON, CHICAS ([id number]) Visit Report for 10/12/2020 Arrival Information Details Patient Name: MAKENLEE, MCKEAG. Date of Service: 10/12/2020 10:15 AM Medical Record Number: [id number] Patient Account Number: [id number] Date of Birth/Sex: 01/30/1956 (64 y.o. F) Treating RN: [name] Primary Care Braylynn Ghan: [name] Other Clinician: Referring Marquize Seib: [name] Treating Lancelot Alyea/Extender: Skipper Cliche in Treatment: 1 Visit Information History Since Last Visit Added or deleted any medications: No Patient Arrived: Ambulatory Has Dressing in Place as Prescribed: Yes Arrival Time: 10:31 Pain Present Now: Yes Accompanied By: sister Transfer Assistance: None Patient Identification Verified: Yes Secondary Verification Process Completed: Yes Patient Has Alerts: Yes Patient Alerts: Patient on Blood Thinner [place] Signature(s) Signed: 10/12/2020 3:46:32 PM By: [name], [name], RN, [name], [name] RN, [name] Entered By: [name], [name], RN, [name], [name] on 10/12/2020 10:32:42 [name] ([id number]) -------------------------------------------------------------------------------- Clinic Level of Care Assessment Details Patient Name: CECILLIA, MENEES. Date of Service: 10/12/2020 10:15 AM Medical Record Number: [id number] Patient Account Number: [id number] Date of Birth/Sex: 1955/07/03 (64 y.o. F) Treating RN: Dolan Amen Primary Care Erendira Crabtree: [name] Other Clinician: Referring Craven Crean: [name] Treating Noretta Frier/Extender: Skipper Cliche in Treatment: 1 Clinic Level of Care Assessment Items TOOL 1 Quantity Score []  - Use when EandM and Procedure is performed on INITIAL visit 0 ASSESSMENTS - Nursing Assessment / Reassessment []  - General Physical Exam (combine w/ comprehensive assessment (listed just below) when performed on new 0 pt. evals) []  - 0 Comprehensive Assessment (HX, ROS, Risk Assessments, Wounds Hx, etc.) ASSESSMENTS - Wound and Skin Assessment  / Reassessment []  - Dermatologic / Skin Assessment (not related to wound area) 0 ASSESSMENTS - Ostomy and/or Continence Assessment and Care []  - Incontinence Assessment and Management 0 []  - 0 Ostomy Care Assessment and Management (repouching, etc.) PROCESS - Coordination of Care []  - Simple Patient / Family Education for ongoing care 0 []  - 0 Complex (extensive) Patient / Family Education for ongoing care []  - 0 Staff obtains Programmer, systems, Records, Test Results / Process Orders []  - 0 Staff telephones HHA, Nursing Homes / Clarify orders / etc []  - 0 Routine Transfer to another Facility (non-emergent condition) []  - 0 Routine Hospital Admission (non-emergent condition) []  - 0 New Admissions / Biomedical engineer / Ordering NPWT, Apligraf, etc. []  - 0 Emergency Hospital Admission (emergent condition) PROCESS - Special Needs []  - Pediatric / Minor Patient Management 0 []  - 0 Isolation Patient Management []  - 0 Hearing / Language / Visual special needs []  - 0 Assessment of Community assistance (transportation, D/C planning, etc.) []  - 0 Additional assistance / Altered mentation []  - 0 Support Surface(s) Assessment (bed, cushion, seat, etc.) INTERVENTIONS - Miscellaneous []  - External ear exam 0 []  - 0 Patient Transfer (multiple staff / Civil Service fast streamer / Similar devices) []  - 0 Simple Staple / Suture removal (25 or less) []  - 0 Complex Staple / Suture removal (26 or more) []  - 0 Hypo/Hyperglycemic Management (do not check if billed separately) []  - 0 Ankle / Brachial Index (ABI) - do not check if billed separately Has the patient been seen at the hospital within the last three years: Yes Total Score: 0 Level Of Care: ____ [name] ([id number]) Electronic Signature(s) Signed: 10/12/2020 4:32:42 PM By: Georges Mouse, Minus Breeding RN Entered By: Georges Mouse, Minus Breeding on 10/12/2020 11:20:21 [name]  ([id number]) -------------------------------------------------------------------------------- Encounter Discharge Information Details Patient Name: [name]. Date of Service: 10/12/2020 10:15 AM Medical Record Number: [id number] Patient Account Number: [id number] Date of  Birth/Sex: 05-05-1955 (65 y.o. F) Treating RN: Donnamarie Poag Primary Care Harutyun Monteverde: [name] Other Clinician: Referring Lamel Mccarley: [name] Treating Simmie Garin/Extender: Skipper Cliche in Treatment: 1 Encounter Discharge Information Items Post Procedure Vitals Discharge Condition: Stable Temperature (F): 97.9 Ambulatory Status: Wheelchair Pulse (bpm): 101 Discharge Destination: Home Respiratory Rate (breaths/min): 18 Transportation: Private Auto Blood Pressure (mmHg): 132/79 Accompanied By: friend Schedule Follow-up Appointment: Yes Clinical Summary of Care: Electronic Signature(s) Signed: 10/12/2020 4:29:31 PM By: Donnamarie Poag Entered By: Donnamarie Poag on 10/12/2020 12:01:39 [name] ([id number]) -------------------------------------------------------------------------------- Lower Extremity Assessment Details Patient Name: [name]. Date of Service: 10/12/2020 10:15 AM Medical Record Number: [id number] Patient Account Number: [id number] Date of Birth/Sex: [birth year] (64 y.o. F) Treating RN: [name] Primary Care Deannah Rossi: [name] Other Clinician: Referring Simrah Chatham: [name] Treating Ramy Greth/Extender: Skipper Cliche in Treatment: 1 Edema Assessment Assessed: [Left: No] [Right: No] [Left: Edema] [Right: :] Calf Left: Right: Point of Measurement: 36 cm From Medial Instep 44 cm 43 cm Ankle Left: Right: Point of Measurement: 10 cm From Medial Instep 27.5 cm 27 cm Vascular Assessment Pulses: Dorsalis Pedis Palpable: [Left:Yes] [Right:Yes] Electronic Signature(s) Signed: 10/12/2020 3:46:32 PM By: [name], [name], RN, [name], [name] RN, [name] Entered By: [name], [name], RN, [name], [name] on  10/12/2020 10:49:49 [name] ([id number]) -------------------------------------------------------------------------------- Multi Wound Chart Details Patient Name: [name]. Date of Service: 10/12/2020 10:15 AM Medical Record Number: [id number] Patient Account Number: [id number] Date of Birth/Sex: [birth year] (64 y.o. F) Treating RN: Dolan Amen Primary Care Makana Feigel: [name] Other Clinician: Referring Persephone Schriever: [name] Treating Elyshia Kumagai/Extender: Skipper Cliche in Treatment: 1 Vital Signs Height(in): 68 Pulse(bpm): 111 Weight(lbs): 244 Blood Pressure(mmHg): 132/79 Body Mass Index(BMI): 37 Temperature(F): 98.9 Respiratory Rate(breaths/min): 16 Photos: [6:No Photos] [7:No Photos] [8:No Photos] Wound Location: [6:Right, Lateral, Anterior Lower Leg] [7:Right, Lateral Calcaneus] [8:Left, Plantar Metatarsal head first] Wounding Event: [6:Gradually Appeared] [7:Gradually Appeared] [8:Gradually Appeared] Primary Etiology: [6:Venous Leg Ulcer] [7:Pressure Ulcer] [8:Diabetic Wound/Ulcer of the Lower Extremity] Comorbid History: [6:N/A] [7:Cataracts, Arrhythmia, Coronary Artery Disease, Hypertension, Myocardial Infarction, Peripheral Venous Disease, Type II Diabetes, Osteoarthritis, Osteomyelitis, Neuropathy] [8:N/A] Date Acquired: [6:08/29/2020] [7:08/29/2020] [8:05/01/2017] Weeks of Treatment: [6:1] [7:1] [8:1] Wound Status: [6:Open] [7:Open] [8:Open] Clustered Wound: [6:Yes] [7:No] [8:No] Measurements L x W x D (cm) [6:3.2x2.2x0.1] [7:3.5x2.2x1.2] [8:0.7x0.5x0.2] Area (cm) : [6:5.529] [7:6.048] [8:0.275] Volume (cm) : [6:0.553] [7:7.257] [8:0.055] % Reduction in Area: [6:36.00%] [7:-248.40%] [8:-95.00%] % Reduction in Volume: [6:36.00%] [7:-1292.90%] [8:3.50%] Starting Position 1 (o'clock): [7:1] Ending Position 1 (o'clock): [7:1] Maximum Distance 1 (cm): [7:0.9] Undermining: [6:N/A] [7:Yes] [8:N/A] Classification: [6:Full Thickness Without Exposed Support  Structures] [7:Category/Stage III] [8:Grade 2] Exudate Amount: [6:N/A] [7:Medium] [8:N/A] Exudate Type: [6:N/A] [7:Serosanguineous] [8:N/A] Exudate Color: [6:N/A] [7:red, brown] [8:N/A] Granulation Amount: [6:N/A] [7:Small (1-33%)] [8:N/A] Necrotic Amount: [6:N/A] [7:Large (67-100%)] [8:N/A] Necrotic Tissue: [6:N/A N/A] [7:Eschar, Adherent Slough None] [8:N/A N/A] Wound Number: 9 N/A N/A Photos: No Photos N/A N/A Wound Location: Right, Medial Lower Leg N/A N/A Wounding Event: Gradually Appeared N/A N/A Primary Etiology: Venous Leg Ulcer N/A N/A Comorbid History: N/A N/A N/A Date Acquired: 10/05/2020 N/A N/A Weeks of Treatment: 1 N/A N/A Wound Status: Open N/A N/A Clustered Wound: No N/A N/A Measurements L x W x D (cm) 0.5x0.5x0.1 N/A N/A Area (cm) : 0.196 N/A N/A Volume (cm) : 0.02 N/A N/A % Reduction in Area: 96.90% N/A N/A % Reduction in Volume: 98.40% N/A N/A Undermining: N/A N/A N/A Classification: Full Thickness Without Exposed N/A N/A Support Structures LESA, VANDALL ([id number]) Exudate Amount:  N/A N/A N/A Exudate Type: N/A N/A N/A Exudate Color: N/A N/A N/A Granulation Amount: N/A N/A N/A Necrotic Amount: N/A N/A N/A Necrotic Tissue: N/A N/A N/A Exposed Structures: N/A N/A N/A Epithelialization: N/A N/A N/A Treatment Notes Electronic Signature(s) Signed: 10/12/2020 4:32:42 PM By: Georges Mouse, Minus Breeding RN Entered By: Georges Mouse, Minus Breeding on 10/12/2020 11:09:35 [name] ([id number]) -------------------------------------------------------------------------------- [place] Details Patient Name: [name]. Date of Service: 10/12/2020 10:15 AM Medical Record Number: [id number] Patient Account Number: [id number] Date of Birth/Sex: [birth year] (64 y.o. F) Treating RN: Dolan Amen Primary Care Terrian Sentell: [name] Other Clinician: Referring Kristia Jupiter: [name] Treating Kebra Lowrimore/Extender: Skipper Cliche in Treatment:  1 Active Inactive Necrotic Tissue Nursing Diagnoses: Impaired tissue integrity related to necrotic/devitalized tissue Goals: Necrotic/devitalized tissue will be minimized in the wound bed Date Initiated: 10/05/2020 Target Resolution Date: 10/05/2020 Goal Status: Active Patient/caregiver will verbalize understanding of reason and process for debridement of necrotic tissue Date Initiated: 10/05/2020 Target Resolution Date: 10/05/2020 Goal Status: Active Interventions: Assess patient pain level pre-, during and post procedure and prior to discharge Provide education on necrotic tissue and debridement process Treatment Activities: Apply topical anesthetic as ordered : 10/05/2020 Excisional debridement : 10/05/2020 Notes: Venous Leg Ulcer Nursing Diagnoses: Actual venous Insuffiency (use after diagnosis is confirmed) Goals: Patient will maintain optimal edema control Date Initiated: 10/05/2020 Target Resolution Date: 10/05/2020 Goal Status: Active Patient/caregiver will verbalize understanding of disease process and disease management Date Initiated: 10/05/2020 Target Resolution Date: 10/05/2020 Goal Status: Active Verify adequate tissue perfusion prior to therapeutic compression application Date Initiated: 10/05/2020 Target Resolution Date: 10/05/2020 Goal Status: Active Interventions: Assess peripheral edema status every visit. Compression as ordered Provide education on venous insufficiency Treatment Activities: Therapeutic compression applied : 10/05/2020 Notes: Wound/Skin Impairment Nursing Diagnoses: Impaired tissue integrity [place], Adelfa E. ([id number]) Goals: Patient will demonstrate a reduced rate of smoking or cessation of smoking Date Initiated: 10/05/2020 Target Resolution Date: 11/04/2020 Goal Status: Active Patient/caregiver will verbalize understanding of skin care regimen Date Initiated: 10/05/2020 Target Resolution Date: 10/05/2020 Goal Status: Active Ulcer/skin breakdown will have  a volume reduction of 30% by week 4 Date Initiated: 10/05/2020 Target Resolution Date: 11/04/2020 Goal Status: Active Ulcer/skin breakdown will have a volume reduction of 50% by week 8 Date Initiated: 10/05/2020 Target Resolution Date: 12/05/2020 Goal Status: Active Ulcer/skin breakdown will have a volume reduction of 80% by week 12 Date Initiated: 10/05/2020 Target Resolution Date: 01/05/2021 Goal Status: Active Ulcer/skin breakdown will heal within 14 weeks Date Initiated: 10/05/2020 Target Resolution Date: 02/04/2021 Goal Status: Active Interventions: Assess patient/caregiver ability to obtain necessary supplies Assess patient/caregiver ability to perform ulcer/skin care regimen upon admission and as needed Assess ulceration(s) every visit Provide education on smoking Provide education on ulcer and skin care Treatment Activities: Referred to DME Cheston Coury for dressing supplies : 10/05/2020 Skin care regimen initiated : 10/05/2020 Notes: Electronic Signature(s) Signed: 10/12/2020 4:32:42 PM By: Georges Mouse, Minus Breeding RN Entered By: Georges Mouse, Minus Breeding on 10/12/2020 11:09:27 [name] ([id number]) -------------------------------------------------------------------------------- Pain Assessment Details Patient Name: [name]. Date of Service: 10/12/2020 10:15 AM Medical Record Number: [id number] Patient Account Number: [id number] Date of Birth/Sex: 1956/02/21 (64 y.o. F) Treating RN: [name] Primary Care Crescent Gotham: [name] Other Clinician: Referring Harim Bi: [name] Treating Akram Kissick/Extender: Skipper Cliche in Treatment: 1 Active Problems Location of Pain Severity and Description of Pain Patient Has Paino Yes Site Locations Pain Management and Medication Current Pain Management: Notes States pain when putting pressure on it. Electronic Signature(s)  Signed: 10/12/2020 3:46:32 PM By: [name], [name], RN, [name], [name] RN, [name] Entered By: [name], [name], RN, [name], [name] on  10/12/2020 10:33:57 [name] ([id number]) -------------------------------------------------------------------------------- Patient/Caregiver Education Details Patient Name: [name] Date of Service: 10/12/2020 10:15 AM Medical Record Number: [id number] Patient Account Number: [id number] Date of Birth/Gender: 1956-01-15 (64 y.o. F) Treating RN: Dolan Amen Primary Care Physician: [name] Other Clinician: Referring Physician: [name] Treating Physician/Extender: Skipper Cliche in Treatment: 1 Education Assessment Education Provided To: Patient Education Topics Provided Wound Debridement: Methods: Explain/Verbal Responses: State content correctly Wound/Skin Impairment: Methods: Explain/Verbal Responses: State content correctly Electronic Signature(s) Signed: 10/12/2020 4:32:42 PM By: Georges Mouse, Minus Breeding RN Entered By: Georges Mouse, Minus Breeding on 10/12/2020 11:20:49 [name] ([id number]) -------------------------------------------------------------------------------- Wound Assessment Details Patient Name: [name]. Date of Service: 10/12/2020 10:15 AM Medical Record Number: [id number] Patient Account Number: [id number] Date of Birth/Sex: [birth year] (64 y.o. F) Treating RN: [name] Primary Care Syd Manges: [name] Other Clinician: Referring Teondre Jarosz: [name] Treating Kylle Lall/Extender: Skipper Cliche in Treatment: 1 Wound Status Wound Number: 6 Primary Etiology: Venous Leg Ulcer Wound Location: Right, Lateral, Anterior Lower Leg Wound Status: Open Wounding Event: Gradually Appeared Date Acquired: 08/29/2020 Weeks Of Treatment: 1 Clustered Wound: Yes Wound Measurements Length: (cm) 3.2 Width: (cm) 2.2 Depth: (cm) 0.1 Area: (cm) 5.529 Volume: (cm) 0.553 % Reduction in Area: 36% % Reduction in Volume: 36% Wound Description Classification: Full Thickness Without Exposed Support Structu res Electronic  Signature(s) Signed: 10/12/2020 3:46:32 PM By: [name], [name], RN, [name], [name] RN, [name] Entered By: [name], [name], RN, [name], [name] on 10/12/2020 10:46:21 [name] ([id number]) -------------------------------------------------------------------------------- Wound Assessment Details Patient Name: [name]. Date of Service: 10/12/2020 10:15 AM Medical Record Number: [id number] Patient Account Number: [id number] Date of Birth/Sex: [birth year] (64 y.o. F) Treating RN: [name] Primary Care Darnell Jeschke: [name] Other Clinician: Referring Hektor Huston: [name] Treating Masayuki Sakai/Extender: Skipper Cliche in Treatment: 1 Wound Status Wound Number: 7 Primary Pressure Ulcer Etiology: Wound Location: Right, Lateral Calcaneus Wound Open Wounding Event: Gradually Appeared Status: Date Acquired: 08/29/2020 Comorbid Cataracts, Arrhythmia, Coronary Artery Disease, Weeks Of Treatment: 1 History: Hypertension, Myocardial Infarction, Peripheral Venous Clustered Wound: No Disease, Type II Diabetes, Osteoarthritis, Osteomyelitis, Neuropathy Wound Measurements Length: (cm) 3.5 Width: (cm) 2.2 Depth: (cm) 1.2 Area: (cm) 6.048 Volume: (cm) 7.257 % Reduction in Area: -248.4% % Reduction in Volume: -1292.9% Epithelialization: None Undermining: Yes Starting Position (o'clock): 1 Ending Position (o'clock): 1 Maximum Distance: (cm) 0.9 Wound Description Classification: Category/Stage III Exudate Amount: Medium Exudate Type: Serosanguineous Exudate Color: red, brown Foul Odor After Cleansing: No Slough/Fibrino Yes Wound Bed Granulation Amount: Small (1-33%) Exposed Structure Necrotic Amount: Large (67-100%) Fascia Exposed: No Necrotic Quality: Eschar, Adherent Slough Fat Layer (Subcutaneous Tissue) Exposed: Yes Tendon Exposed: No Muscle Exposed: No Joint Exposed: No Bone Exposed: No Treatment Notes Wound #7 (Calcaneus) Wound Laterality: Right, Lateral Cleanser Soap and  Water Discharge Instruction: Gently cleanse wound with antibacterial soap, rinse and pat dry prior to dressing wounds Peri-Wound Care Topical Primary Dressing Iodosorb 40 (g) Discharge Instruction: Apply IodoSorb to wound bed only as directed. Secondary Dressing ABD Pad 5x9 (in/in) Discharge Instruction: Cover with ABD pad Secured With WILLEAN, SCHURMAN ([id number]) Kerlix Roll Sterile or Non-Sterile 6-ply 4.5x4 (yd/yd) Discharge Instruction: Apply Kerlix from base of toes to 2 fingers width below the knee Coban Cohesive Bandage 4x5 (yds) Stretched Discharge Instruction: Apply coban LIGHTLY from base of toes to 2 fingers width below the knee to secure  kerlix Compression Wrap Compression Stockings Add-Ons Electronic Signature(s) Signed: 10/12/2020 3:46:32 PM By: [name], [name], RN, [name], [name] RN, [name] Entered By: [name], [name], RN, [name], [name] on 10/12/2020 10:43:43 [name] ([id number]) -------------------------------------------------------------------------------- Wound Assessment Details Patient Name: RAELA, BOHL. Date of Service: 10/12/2020 10:15 AM Medical Record Number: [id number] Patient Account Number: [id number] Date of Birth/Sex: Jul 03, 1955 (64 y.o. F) Treating RN: [name] Primary Care Vue Pavon: [name] Other Clinician: Referring Christino Mcglinchey: [name] Treating Ilaria Much/Extender: Skipper Cliche in Treatment: 1 Wound Status Wound Number: 8 Primary Etiology: Diabetic Wound/Ulcer of the Lower Extremity Wound Location: Left, Plantar Metatarsal head first Wound Status: Open Wounding Event: Gradually Appeared Date Acquired: 05/01/2017 Weeks Of Treatment: 1 Clustered Wound: No Wound Measurements Length: (cm) 0.7 Width: (cm) 0.5 Depth: (cm) 0.2 Area: (cm) 0.275 Volume: (cm) 0.055 % Reduction in Area: -95% % Reduction in Volume: 3.5% Wound Description Classification: Grade 2 Treatment Notes Wound #8 (Metatarsal head first) Wound Laterality: Plantar,  Left Cleanser Soap and Water Discharge Instruction: Gently cleanse wound with antibacterial soap, rinse and pat dry prior to dressing wounds Peri-Wound Care Topical Primary Dressing Silvercel Small 2x2 (in/in) Discharge Instruction: Apply Silvercel Small 2x2 (in/in) as instructed Secondary Dressing ABD Pad 5x9 (in/in) Discharge Instruction: Cover with ABD pad [place] Discharge Instruction: [place] as directed Secured With 77M Medipore H Soft Cloth Surgical Tape, 2x2 (in/yd) Compression Wrap Compression Stockings Add-Ons Electronic Signature(s) Signed: 10/12/2020 3:46:32 PM By: [name], [name], RN, [name], [name] RN, [name] Entered By: [name], [name], RN, [name], [name] on 10/12/2020 10:46:21 [name] ([id number]) -------------------------------------------------------------------------------- Wound Assessment Details Patient Name: [name]. Date of Service: 10/12/2020 10:15 AM Medical Record Number: [id number] Patient Account Number: [id number] Date of Birth/Sex: 1955-06-22 (64 y.o. F) Treating RN: [name] Primary Care Reginna Sermeno: [name] Other Clinician: Referring Bri Wakeman: [name] Treating Tinea Nobile/Extender: Skipper Cliche in Treatment: 1 Wound Status Wound Number: 9 Primary Etiology: Venous Leg Ulcer Wound Location: Right, Medial Lower Leg Wound Status: Open Wounding Event: Gradually Appeared Date Acquired: 10/05/2020 Weeks Of Treatment: 1 Clustered Wound: No Wound Measurements Length: (cm) 0.5 Width: (cm) 0.5 Depth: (cm) 0.1 Area: (cm) 0.196 Volume: (cm) 0.02 % Reduction in Area: 96.9% % Reduction in Volume: 98.4% Wound Description Classification: Full Thickness Without Exposed Support Structu res Treatment Notes Wound #9 (Lower Leg) Wound Laterality: Right, Medial Cleanser Soap and Water Discharge Instruction: Gently cleanse wound with antibacterial soap, rinse and pat dry prior to dressing  wounds Peri-Wound Care Topical Primary Dressing Silvercel Small 2x2 (in/in) Discharge Instruction: Apply Silvercel Small 2x2 (in/in) as instructed Secondary Dressing ABD Pad 5x9 (in/in) Discharge Instruction: Cover with ABD pad Secured With Kerlix Roll Sterile or Non-Sterile 6-ply 4.5x4 (yd/yd) Discharge Instruction: Apply Kerlix from base of toes to 2 fingers width below the knee Coban Cohesive Bandage 4x5 (yds) Stretched Discharge Instruction: Apply coban LIGHTLY from base of toes to 2 fingers width below the knee to secure kerlix Compression Wrap Compression Stockings Add-Ons Electronic Signature(s) Signed: 10/12/2020 3:46:32 PM By: [name], [name], RN, [name], [name] RN, [name] Entered By: [name], [name], RN, [name], [name] on 10/12/2020 10:46:21 [name] ([id number]) -------------------------------------------------------------------------------- Vitals Details Patient Name: [name]. Date of Service: 10/12/2020 10:15 AM Medical Record Number: [id number] Patient Account Number: [id number] Date of Birth/Sex: May 23, 1955 (64 y.o. F) Treating RN: [name] Primary Care Ellie Bryand: [name] Other Clinician: Referring Amiera Herzberg: [name] Treating Alcide Memoli/Extender: Skipper Cliche in Treatment: 1 Vital Signs Time Taken: 10:32 Temperature (F):  98.9 Height (in): 68 Pulse (bpm): 111 Weight (lbs): 244 Respiratory Rate (breaths/min): 16 Body Mass Index (BMI): 37.1 Blood Pressure (mmHg): 132/79 Reference Range: 80 - 120 mg / dl Electronic Signature(s) Signed: 10/12/2020 3:46:32 PM By: [name], [name], RN, [name], [name] RN, [name] Entered By: [name], [name], RN, [name], [name] on 10/12/2020 10:33:04

## 2020-10-12 NOTE — Progress Notes (Addendum)
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 time which is great news. No fever chills 06/22/2020 upon evaluation today patient appears to be doing well with regard to her wound from the standpoint of infection I do not see any signs of infection. With that being said  I unfortunately am having some issues here with callus buildup again it has been almost 6 weeks since have seen her previously. Obviously she has a lot of callus buildup even just week to week and this is no exception. Nonetheless we are can have to perform sharp debridement to clear this away and try to get this moving in the right direction. 06/29/2020 upon evaluation today patient appears to be doing decently well in regard to her foot ulcer. She does have some callus but this is minimal compared to previously noted callus amounts. Fortunately there does not appear to be any evidence of active infection which is great news. No fevers, chills, nausea, vomiting, or diarrhea. 07/06/2020 upon evaluation today patient appears to be doing well with regard to her wound. This is measuring smaller and actually is doing much better. Fortunately there is no evidence of active infection at this time. No fevers, chills, nausea, vomiting, or diarrhea. 07/13/2020 upon evaluation today patient appears to be doing well with regard to her wound currently. Fortunately this is not looking to be any worse although not sure making the counter progress and I would really like to be seen. She still develops callus even though is not as much as she has in times past I still think it is too much and preventing healing. Subsequently I think she may benefit from an application of a total contact cast. I discussed that with her today and subsequently the fact that depending on how things go we may be able to get this wound healed more effectively with the cast in any other way. 07/22/2020 upon evaluation today patient appears to be doing decently well in regard to her foot ulcer although she is really not significantly improved this does not appear to be doing any worse which is great news overall I am pleased in that regard. In general I feel like that she still would benefit from the total contact cast. She just cannot completely  offload otherwise and I think that she is going to continue to have issues if we do not do something about that. 07/27/2020 upon  evaluation today patient appears to be doing well with regard to her wound though she still has a lot of callus buildup unfortunately. There does not appear to be any signs of active infection which is great news and overall very pleased with where things stand today. 07/29/2020 upon evaluation today patient's wound actually appears to be doing significantly better. The cast did extremely well in this regard. Unfortunately she tells me that it was so heavy and she is already so weak that she was not even able to sleep with her foot in the bed she had to hanging off the edge and obviously this was not ideal her swelling is creased. Nonetheless I think that the main issue that I see currently is that the patient unfortunately is not can go back into the cast despite the fact that the wound looks significantly smaller even in 2 days compared to what it was previous. 08/05/20 on evaluation today patient appears to be doing well with regard to her wound. Fortunately there is no signs of worsening infection wound appears to be a little bit smaller which is great news. No fevers, chills, nausea, vomiting, or diarrhea. Readmission: 10/05/2020 upon evaluation today patient appears to be doing poorly in regard to her right leg where she has multiple wounds she also has a wound on her heel and subsequently she also has a continuing issue with her left plantar foot. This is what we have in the past treated her for. We have not seen her since April 7. At that time she was doing okay on her left foot and no other wounds. Subsequently following that she ended up in the hospital and was not doing nearly as well she is much weaker at this point. With that being said again she does have the foot ulcer. She also has evidence again of wounds on the right lower extremity and again I do believe she  has chronic venous stasis here she is on fluid pill but nonetheless is still having a lot of fluid leakage which I think is leading to some of the wounds that were experiencing seeing at this point. She is seen with her brother in the office today. This is the first time of actually seeing him with her. 10/12/2020 upon evaluation today patient's wounds appear to be doing slightly better in regard to the left foot and the right lower extremities. With that being said the heel on the right has a lot of necrotic tissue that is going require some sharp debridement today. This is definitely the worst of all of her wounds. Overall I do not see any signs of active infection at this time. No fevers, chills, nausea, vomiting, or diarrhea. Objective DEYONA, SOZA ([id number]) Constitutional Obese and well-hydrated in no acute distress. Vitals Time Taken: 10:32 AM, Height: 68 in, Weight: 244 lbs, BMI: 37.1, Temperature: 98.9 F, Pulse: 111 bpm, Respiratory Rate: 16 breaths/min, Blood Pressure: 132/79 mmHg. Respiratory normal breathing without difficulty. Psychiatric this patient is able to make decisions and demonstrates good insight into disease process. Alert and Oriented x 3. pleasant and cooperative. General Notes: Upon inspection patient's wound bed showed signs of good granulation epithelization at this point. This is in regard to the left foot. Overall I did have to perform some sharp debridement here but nothing as severe as what we previously had to do. I did also perform debridement of the right heel where there was a lot of necrotic debris I was able to remove a lot  but not everything. Integumentary (Hair, Skin) Wound #6 status is Open. Original cause of wound was Gradually Appeared. The date acquired was: 08/29/2020. The wound has been in treatment 1 weeks. The wound is located on the Right,Lateral,Anterior Lower Leg. The wound measures 3.2cm length x 2.2cm width x 0.1cm depth; 5.529cm^2 area  and 0.553cm^3 volume. Wound #7 status is Open. Original cause of wound was Gradually Appeared. The date acquired was: 08/29/2020. The wound has been in treatment 1 weeks. The wound is located on the Right,Lateral Calcaneus. The wound measures 3.5cm length x 2.2cm width x 1.2cm depth; 6.048cm^2 area and 7.257cm^3 volume. There is Fat Layer (Subcutaneous Tissue) exposed. There is undermining starting at 1:00 and ending at 1:00 with a maximum distance of 0.9cm. There is a medium amount of serosanguineous drainage noted. There is small (1-33%) granulation within the wound bed. There is a large (67-100%) amount of necrotic tissue within the wound bed including Eschar and Adherent Slough. Wound #8 status is Open. Original cause of wound was Gradually Appeared. The date acquired was: 05/01/2017. The wound has been in treatment 1 weeks. The wound is located on the Left,Plantar Metatarsal head first. The wound measures 0.7cm length x 0.5cm width x 0.2cm depth; 0.275cm^2 area and 0.055cm^3 volume. Wound #9 status is Open. Original cause of wound was Gradually Appeared. The date acquired was: 10/05/2020. The wound has been in treatment 1 weeks. The wound is located on the Right,Medial Lower Leg. The wound measures 0.5cm length x 0.5cm width x 0.1cm depth; 0.196cm^2 area and 0.02cm^3 volume. Assessment Active Problems ICD-10 Type 2 diabetes mellitus with foot ulcer Non-pressure chronic ulcer of other part of left foot with fat layer exposed Venous insufficiency (chronic) (peripheral) Pressure ulcer of right heel, unstageable Non-pressure chronic ulcer of other part of right lower leg with fat layer exposed Other specified peripheral vascular diseases Paroxysmal atrial fibrillation Chronic combined systolic (congestive) and diastolic (congestive) heart failure Cardiomyopathy in diseases classified elsewhere Long term (current) use of anticoagulants Corns and callosities Procedures Wound #7 Pre-procedure  diagnosis of Wound #7 is a Pressure Ulcer located on the Right,Lateral Calcaneus . There was a Excisional Skin/Subcutaneous Tissue/Muscle Debridement with a total area of 7.7 sq cm performed by Tommie Sams., [name]. With the following instrument(s): Curette, Forceps, and Scissors to remove Non-Viable tissue/material. Material removed includes Muscle, Eschar, Subcutaneous Tissue, and Slough. A time out was conducted at 11:11, prior to the start of the procedure. A Minimum amount of bleeding was controlled with Pressure. The procedure was tolerated well. Post Debridement Measurements: 3.5cm length x 2.2cm width x 1.3cm depth; 7.862cm^3 volume. Post debridement Stage noted as Category/Stage IV. Character of Wound/Ulcer Post Debridement is stable. Post procedure Diagnosis Wound #7: Same as Pre-Procedure MARVIN, MAENZA. ([id number]) Wound #8 Pre-procedure diagnosis of Wound #8 is a Diabetic Wound/Ulcer of the Lower Extremity located on the Left,Plantar Metatarsal head first .Severity of Tissue Pre Debridement is: Fat layer exposed. There was a Excisional Skin/Subcutaneous Tissue Debridement with a total area of 1 sq cm performed by Tommie Sams., [name]. With the following instrument(s): Curette to remove Viable and Non-Viable tissue/material. Material removed includes Callus, Subcutaneous Tissue, and Slough. A time out was conducted at 11:11, prior to the start of the procedure. A Minimum amount of bleeding was controlled with Pressure. The procedure was tolerated well. Post Debridement Measurements: 0.7cm length x 0.5cm width x 0.2cm depth; 0.055cm^3 volume. Character of Wound/Ulcer Post Debridement is stable. Severity of Tissue Post Debridement is: Fat layer  exposed. Post procedure Diagnosis Wound #8: Same as Pre-Procedure Plan Follow-up Appointments: Return Appointment in 1 week. Bathing/ Shower/ Hygiene: May shower with wound dressing protected with water repellent cover or cast protector. No  tub bath. WOUND #6: - Lower Leg Wound Laterality: Right, Lateral, Anterior Cleanser: Soap and Water 1 x Per Week/15 Days Discharge Instructions: Gently cleanse wound with antibacterial soap, rinse and pat dry prior to dressing wounds Primary Dressing: Silvercel Small 2x2 (in/in) 1 x Per Week/15 Days Discharge Instructions: Apply Silvercel Small 2x2 (in/in) as instructed Secondary Dressing: ABD Pad 5x9 (in/in) 1 x Per Week/15 Days Discharge Instructions: Cover with ABD pad Secured With: Coban Cohesive Bandage 4x5 (yds) Stretched 1 x Per Week/15 Days Discharge Instructions: Apply coban LIGHTLY from base of toes to 2 fingers width below the knee to secure kerlix Secured With: [employer] or Non-Sterile 6-ply 4.5x4 (yd/yd) 1 x Per Week/15 Days Discharge Instructions: Apply Kerlix from base of toes to 2 fingers width below the knee WOUND #7: - Calcaneus Wound Laterality: Right, Lateral Cleanser: Soap and Water 1 x Per Week/15 Days Discharge Instructions: Gently cleanse wound with antibacterial soap, rinse and pat dry prior to dressing wounds Primary Dressing: Iodosorb 40 (g) 1 x Per Week/15 Days Discharge Instructions: Apply IodoSorb to wound bed only as directed. Secondary Dressing: ABD Pad 5x9 (in/in) 1 x Per Week/15 Days Discharge Instructions: Cover with ABD pad Secured With: Coban Cohesive Bandage 4x5 (yds) Stretched 1 x Per Week/15 Days Discharge Instructions: Apply coban LIGHTLY from base of toes to 2 fingers width below the knee to secure kerlix Secured With: [employer] or Non-Sterile 6-ply 4.5x4 (yd/yd) 1 x Per Week/15 Days Discharge Instructions: Apply Kerlix from base of toes to 2 fingers width below the knee WOUND #8: - Metatarsal head first Wound Laterality: Plantar, Left Cleanser: Soap and Water 3 x Per Week/30 Days Discharge Instructions: Gently cleanse wound with antibacterial soap, rinse and pat dry prior to dressing wounds Primary Dressing: Silvercel Small  2x2 (in/in) (Generic) 3 x Per Week/30 Days Discharge Instructions: Apply Silvercel Small 2x2 (in/in) as instructed Secondary Dressing: ABD Pad 5x9 (in/in) (Generic) 3 x Per Week/30 Days Discharge Instructions: Cover with ABD pad Secondary Dressing: Conforming Guaze Roll-Large (Generic) 3 x Per Week/30 Days Discharge Instructions: Apply Conforming Stretch Guaze Bandage as directed Secured With: 60M Medipore H Soft Cloth Surgical Tape, 2x2 (in/yd) (Generic) 3 x Per Week/30 Days WOUND #9: - Lower Leg Wound Laterality: Right, Medial Cleanser: Soap and Water 1 x Per Week/15 Days Discharge Instructions: Gently cleanse wound with antibacterial soap, rinse and pat dry prior to dressing wounds Primary Dressing: Silvercel Small 2x2 (in/in) 1 x Per Week/15 Days Discharge Instructions: Apply Silvercel Small 2x2 (in/in) as instructed Secondary Dressing: ABD Pad 5x9 (in/in) 1 x Per Week/15 Days Discharge Instructions: Cover with ABD pad Secured With: Coban Cohesive Bandage 4x5 (yds) Stretched 1 x Per Week/15 Days Discharge Instructions: Apply coban LIGHTLY from base of toes to 2 fingers width below the knee to secure kerlix Secured With: [employer] or Non-Sterile 6-ply 4.5x4 (yd/yd) 1 x Per Week/15 Days Discharge Instructions: Apply Kerlix from base of toes to 2 fingers width below the knee 1. Would recommend currently that we continue with the wound care measures as before overall. That is that we will be using the silver alginate dressing for the right lower leg followed by an ABD pad and again we are using a compression wrap here. 2. With regard to the heel we  will get a continue with the Iodosorb followed by ABD pad which I think is still the best way to go. 3. With regard to the metatarsal head on the left foot we will get a use silver cell here as well followed by ABD pad. We will see patient back for reevaluation in 1 week here in the clinic. If anything worsens or changes patient will  contact our office for additional recommendations. RESA, RINKS ([id number]) Electronic Signature(s) Signed: 10/14/2020 10:08:32 AM By: [name] [name] Entered By: [name] on 10/14/2020 10:08:32 Melinda [name] ([id number]) -------------------------------------------------------------------------------- SuperBill Details Patient Name: Melinda [name] Date of Service: 10/12/2020 Medical Record Number: [id number] Patient Account Number: [id number] Date of Birth/Sex: 06-18-1955 (64 y.o. F) Treating RN: Dolan Amen Primary Care Provider: [name] Other Clinician: Referring Provider: [name] Treating Provider/Extender: Skipper Cliche in Treatment: 1 Diagnosis Coding ICD-10 Codes Code Description E11.621 Type 2 diabetes mellitus with foot ulcer 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sed I87.2 Venous insufficiency (chronic) (peripheral) L89.610 Pressure ulcer of right heel, unstageable L97.812 Non-pressure chronic ulcer of other part of right lower leg with fat layer exposed I73.89 Other specified peripheral vascular diseases I48.0 Paroxysmal atrial fibrillation I50.42 Chronic combined systolic (congestive) and diastolic (congestive) heart failure I43 Cardiomyopathy in diseases classified elsewhere Z79.01 Long term (current) use of anticoagulants L84 Corns and callosities Facility Procedures CPT4 Code: 90300923 Description: 11042 - DEB SUBQ TISSUE 20 SQ CM/< Modifier: Quantity: 1 CPT4 Code: Description: ICD-10 Diagnosis Description L97.522 Non-pressure chronic ulcer of other part of left foot with fat layer exp Modifier: osed Quantity: CPT4 Code: 30076226 Description: 33354 - DEB MUSC/FASCIA 20 SQ CM/< Modifier: Quantity: 1 CPT4 Code: Description: ICD-10 Diagnosis Description L89.610 Pressure ulcer of right heel, unstageable Modifier: Quantity: Physician Procedures CPT4 Code: 5625638 Description: 93734 -  WC PHYS SUBQ TISS 20 SQ CM Modifier: Quantity: 1 CPT4 Code: Description: ICD-10 Diagnosis Description 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s Modifier: ed Quantity: CPT4 Code: 2876811 Description: 11043 - WC PHYS DEBR MUSCLE/FASCIA 20 SQ CM Modifier: Quantity: 1 CPT4 Code: Description: ICD-10 Diagnosis Description L89.610 Pressure ulcer of right heel, unstageable Modifier: Quantity: Electronic Signature(s) Signed: 10/14/2020 10:09:21 AM By: [name] [name] Entered By: [name] on 10/14/2020 10:09:21

## 2020-10-14 ENCOUNTER — Other Ambulatory Visit (INDEPENDENT_AMBULATORY_CARE_PROVIDER_SITE_OTHER): Payer: Self-pay | Admitting: Vascular Surgery

## 2020-10-14 DIAGNOSIS — I739 Peripheral vascular disease, unspecified: Secondary | ICD-10-CM

## 2020-10-15 ENCOUNTER — Other Ambulatory Visit: Payer: Self-pay | Admitting: Pharmacist

## 2020-10-15 MED ORDER — ELIQUIS 5 MG PO TABS: 5.0000 mg | ORAL_TABLET | Freq: Two times a day (BID) | ORAL | 5 refills | Status: DC

## 2020-10-15 NOTE — Progress Notes (Signed)
Age 65, weight 111kg, scr 1.24 on 10/07/20 Aflutter, last OV March 2022

## 2020-10-18 ENCOUNTER — Ambulatory Visit (INDEPENDENT_AMBULATORY_CARE_PROVIDER_SITE_OTHER): Payer: Medicare HMO | Admitting: Nurse Practitioner

## 2020-10-18 ENCOUNTER — Encounter (INDEPENDENT_AMBULATORY_CARE_PROVIDER_SITE_OTHER): Payer: Medicare HMO

## 2020-10-19 ENCOUNTER — Ambulatory Visit: Payer: Medicare HMO | Admitting: Cardiovascular Disease

## 2020-10-19 ENCOUNTER — Other Ambulatory Visit: Payer: Self-pay

## 2020-10-19 ENCOUNTER — Emergency Department: Payer: Medicare HMO

## 2020-10-19 ENCOUNTER — Encounter: Payer: Medicare HMO | Admitting: Physician Assistant

## 2020-10-19 ENCOUNTER — Telehealth: Payer: Self-pay

## 2020-10-19 ENCOUNTER — Inpatient Hospital Stay
Admission: EM | Admit: 2020-10-19 | Discharge: 2020-10-29 | DRG: 853 | Disposition: E | Payer: Medicare HMO | Attending: Internal Medicine | Admitting: Internal Medicine

## 2020-10-19 ENCOUNTER — Observation Stay: Payer: Medicare HMO

## 2020-10-19 DIAGNOSIS — I5020 Unspecified systolic (congestive) heart failure: Secondary | ICD-10-CM

## 2020-10-19 DIAGNOSIS — A419 Sepsis, unspecified organism: Secondary | ICD-10-CM | POA: Diagnosis not present

## 2020-10-19 DIAGNOSIS — Z825 Family history of asthma and other chronic lower respiratory diseases: Secondary | ICD-10-CM

## 2020-10-19 DIAGNOSIS — I5023 Acute on chronic systolic (congestive) heart failure: Secondary | ICD-10-CM | POA: Diagnosis present

## 2020-10-19 DIAGNOSIS — M86171 Other acute osteomyelitis, right ankle and foot: Secondary | ICD-10-CM | POA: Diagnosis present

## 2020-10-19 DIAGNOSIS — J9602 Acute respiratory failure with hypercapnia: Secondary | ICD-10-CM | POA: Diagnosis not present

## 2020-10-19 DIAGNOSIS — I10 Essential (primary) hypertension: Secondary | ICD-10-CM | POA: Diagnosis present

## 2020-10-19 DIAGNOSIS — Z832 Family history of diseases of the blood and blood-forming organs and certain disorders involving the immune mechanism: Secondary | ICD-10-CM

## 2020-10-19 DIAGNOSIS — I480 Paroxysmal atrial fibrillation: Secondary | ICD-10-CM | POA: Diagnosis present

## 2020-10-19 DIAGNOSIS — Z9884 Bariatric surgery status: Secondary | ICD-10-CM

## 2020-10-19 DIAGNOSIS — L97529 Non-pressure chronic ulcer of other part of left foot with unspecified severity: Secondary | ICD-10-CM | POA: Diagnosis present

## 2020-10-19 DIAGNOSIS — E872 Acidosis: Secondary | ICD-10-CM | POA: Diagnosis present

## 2020-10-19 DIAGNOSIS — R0902 Hypoxemia: Secondary | ICD-10-CM

## 2020-10-19 DIAGNOSIS — I252 Old myocardial infarction: Secondary | ICD-10-CM

## 2020-10-19 DIAGNOSIS — Z87891 Personal history of nicotine dependence: Secondary | ICD-10-CM

## 2020-10-19 DIAGNOSIS — E669 Obesity, unspecified: Secondary | ICD-10-CM | POA: Diagnosis present

## 2020-10-19 DIAGNOSIS — R112 Nausea with vomiting, unspecified: Secondary | ICD-10-CM | POA: Diagnosis present

## 2020-10-19 DIAGNOSIS — B9620 Unspecified Escherichia coli [E. coli] as the cause of diseases classified elsewhere: Secondary | ICD-10-CM | POA: Diagnosis present

## 2020-10-19 DIAGNOSIS — E11621 Type 2 diabetes mellitus with foot ulcer: Secondary | ICD-10-CM | POA: Diagnosis present

## 2020-10-19 DIAGNOSIS — R0600 Dyspnea, unspecified: Secondary | ICD-10-CM

## 2020-10-19 DIAGNOSIS — S98131A Complete traumatic amputation of one right lesser toe, initial encounter: Secondary | ICD-10-CM | POA: Diagnosis present

## 2020-10-19 DIAGNOSIS — I2510 Atherosclerotic heart disease of native coronary artery without angina pectoris: Secondary | ICD-10-CM | POA: Diagnosis present

## 2020-10-19 DIAGNOSIS — Z452 Encounter for adjustment and management of vascular access device: Secondary | ICD-10-CM

## 2020-10-19 DIAGNOSIS — D649 Anemia, unspecified: Secondary | ICD-10-CM | POA: Diagnosis present

## 2020-10-19 DIAGNOSIS — Z66 Do not resuscitate: Secondary | ICD-10-CM | POA: Diagnosis not present

## 2020-10-19 DIAGNOSIS — F419 Anxiety disorder, unspecified: Secondary | ICD-10-CM | POA: Diagnosis present

## 2020-10-19 DIAGNOSIS — Z833 Family history of diabetes mellitus: Secondary | ICD-10-CM

## 2020-10-19 DIAGNOSIS — L97419 Non-pressure chronic ulcer of right heel and midfoot with unspecified severity: Secondary | ICD-10-CM | POA: Diagnosis present

## 2020-10-19 DIAGNOSIS — I4892 Unspecified atrial flutter: Secondary | ICD-10-CM | POA: Diagnosis present

## 2020-10-19 DIAGNOSIS — Z952 Presence of prosthetic heart valve: Secondary | ICD-10-CM

## 2020-10-19 DIAGNOSIS — I255 Ischemic cardiomyopathy: Secondary | ICD-10-CM | POA: Diagnosis present

## 2020-10-19 DIAGNOSIS — T148XXA Other injury of unspecified body region, initial encounter: Secondary | ICD-10-CM | POA: Diagnosis not present

## 2020-10-19 DIAGNOSIS — R739 Hyperglycemia, unspecified: Secondary | ICD-10-CM | POA: Diagnosis present

## 2020-10-19 DIAGNOSIS — E1152 Type 2 diabetes mellitus with diabetic peripheral angiopathy with gangrene: Secondary | ICD-10-CM | POA: Diagnosis present

## 2020-10-19 DIAGNOSIS — Z0189 Encounter for other specified special examinations: Secondary | ICD-10-CM

## 2020-10-19 DIAGNOSIS — G928 Other toxic encephalopathy: Secondary | ICD-10-CM | POA: Diagnosis not present

## 2020-10-19 DIAGNOSIS — I4510 Unspecified right bundle-branch block: Secondary | ICD-10-CM | POA: Diagnosis not present

## 2020-10-19 DIAGNOSIS — Z89412 Acquired absence of left great toe: Secondary | ICD-10-CM

## 2020-10-19 DIAGNOSIS — I462 Cardiac arrest due to underlying cardiac condition: Secondary | ICD-10-CM | POA: Diagnosis not present

## 2020-10-19 DIAGNOSIS — S98132A Complete traumatic amputation of one left lesser toe, initial encounter: Secondary | ICD-10-CM | POA: Diagnosis present

## 2020-10-19 DIAGNOSIS — I82531 Chronic embolism and thrombosis of right popliteal vein: Secondary | ICD-10-CM | POA: Diagnosis present

## 2020-10-19 DIAGNOSIS — Z8249 Family history of ischemic heart disease and other diseases of the circulatory system: Secondary | ICD-10-CM

## 2020-10-19 DIAGNOSIS — I472 Ventricular tachycardia: Secondary | ICD-10-CM | POA: Diagnosis not present

## 2020-10-19 DIAGNOSIS — F32A Depression, unspecified: Secondary | ICD-10-CM | POA: Diagnosis present

## 2020-10-19 DIAGNOSIS — Z89421 Acquired absence of other right toe(s): Secondary | ICD-10-CM

## 2020-10-19 DIAGNOSIS — E1142 Type 2 diabetes mellitus with diabetic polyneuropathy: Secondary | ICD-10-CM | POA: Diagnosis present

## 2020-10-19 DIAGNOSIS — Z794 Long term (current) use of insulin: Secondary | ICD-10-CM

## 2020-10-19 DIAGNOSIS — N179 Acute kidney failure, unspecified: Secondary | ICD-10-CM | POA: Diagnosis not present

## 2020-10-19 DIAGNOSIS — Z79899 Other long term (current) drug therapy: Secondary | ICD-10-CM

## 2020-10-19 DIAGNOSIS — R778 Other specified abnormalities of plasma proteins: Secondary | ICD-10-CM | POA: Diagnosis not present

## 2020-10-19 DIAGNOSIS — E1122 Type 2 diabetes mellitus with diabetic chronic kidney disease: Secondary | ICD-10-CM | POA: Diagnosis present

## 2020-10-19 DIAGNOSIS — R6521 Severe sepsis with septic shock: Secondary | ICD-10-CM | POA: Diagnosis not present

## 2020-10-19 DIAGNOSIS — E1169 Type 2 diabetes mellitus with other specified complication: Secondary | ICD-10-CM | POA: Diagnosis present

## 2020-10-19 DIAGNOSIS — Z951 Presence of aortocoronary bypass graft: Secondary | ICD-10-CM

## 2020-10-19 DIAGNOSIS — E785 Hyperlipidemia, unspecified: Secondary | ICD-10-CM | POA: Diagnosis present

## 2020-10-19 DIAGNOSIS — Z7901 Long term (current) use of anticoagulants: Secondary | ICD-10-CM

## 2020-10-19 DIAGNOSIS — I70261 Atherosclerosis of native arteries of extremities with gangrene, right leg: Secondary | ICD-10-CM | POA: Diagnosis present

## 2020-10-19 DIAGNOSIS — I130 Hypertensive heart and chronic kidney disease with heart failure and stage 1 through stage 4 chronic kidney disease, or unspecified chronic kidney disease: Secondary | ICD-10-CM | POA: Diagnosis present

## 2020-10-19 DIAGNOSIS — R570 Cardiogenic shock: Secondary | ICD-10-CM | POA: Diagnosis not present

## 2020-10-19 DIAGNOSIS — R296 Repeated falls: Secondary | ICD-10-CM | POA: Diagnosis present

## 2020-10-19 DIAGNOSIS — N1831 Chronic kidney disease, stage 3a: Secondary | ICD-10-CM

## 2020-10-19 DIAGNOSIS — Z6837 Body mass index (BMI) 37.0-37.9, adult: Secondary | ICD-10-CM

## 2020-10-19 DIAGNOSIS — N183 Chronic kidney disease, stage 3 unspecified: Secondary | ICD-10-CM | POA: Diagnosis present

## 2020-10-19 DIAGNOSIS — E1165 Type 2 diabetes mellitus with hyperglycemia: Secondary | ICD-10-CM | POA: Diagnosis present

## 2020-10-19 DIAGNOSIS — Z20822 Contact with and (suspected) exposure to covid-19: Secondary | ICD-10-CM | POA: Diagnosis present

## 2020-10-19 DIAGNOSIS — R000 Tachycardia, unspecified: Secondary | ICD-10-CM

## 2020-10-19 DIAGNOSIS — Z515 Encounter for palliative care: Secondary | ICD-10-CM

## 2020-10-19 DIAGNOSIS — I471 Supraventricular tachycardia: Secondary | ICD-10-CM | POA: Diagnosis not present

## 2020-10-19 DIAGNOSIS — K219 Gastro-esophageal reflux disease without esophagitis: Secondary | ICD-10-CM | POA: Diagnosis present

## 2020-10-19 DIAGNOSIS — E876 Hypokalemia: Secondary | ICD-10-CM | POA: Diagnosis present

## 2020-10-19 DIAGNOSIS — J9601 Acute respiratory failure with hypoxia: Secondary | ICD-10-CM | POA: Diagnosis not present

## 2020-10-19 DIAGNOSIS — Z95820 Peripheral vascular angioplasty status with implants and grafts: Secondary | ICD-10-CM

## 2020-10-19 DIAGNOSIS — D573 Sickle-cell trait: Secondary | ICD-10-CM | POA: Diagnosis present

## 2020-10-19 DIAGNOSIS — R069 Unspecified abnormalities of breathing: Secondary | ICD-10-CM

## 2020-10-19 DIAGNOSIS — L089 Local infection of the skin and subcutaneous tissue, unspecified: Secondary | ICD-10-CM | POA: Diagnosis present

## 2020-10-19 DIAGNOSIS — N1832 Chronic kidney disease, stage 3b: Secondary | ICD-10-CM | POA: Diagnosis present

## 2020-10-19 LAB — CBC WITH DIFFERENTIAL/PLATELET
Abs Immature Granulocytes: 0.06 10*3/uL (ref 0.00–0.07)
Basophils Absolute: 0 10*3/uL (ref 0.0–0.1)
Basophils Relative: 0 %
Eosinophils Absolute: 0.1 10*3/uL (ref 0.0–0.5)
Eosinophils Relative: 0 %
HCT: 29.8 % — ABNORMAL LOW (ref 36.0–46.0)
Hemoglobin: 10 g/dL — ABNORMAL LOW (ref 12.0–15.0)
Immature Granulocytes: 0 %
Lymphocytes Relative: 10 %
Lymphs Abs: 1.5 10*3/uL (ref 0.7–4.0)
MCH: 25.9 pg — ABNORMAL LOW (ref 26.0–34.0)
MCHC: 33.6 g/dL (ref 30.0–36.0)
MCV: 77.2 fL — ABNORMAL LOW (ref 80.0–100.0)
Monocytes Absolute: 1.1 10*3/uL — ABNORMAL HIGH (ref 0.1–1.0)
Monocytes Relative: 8 %
Neutro Abs: 12 10*3/uL — ABNORMAL HIGH (ref 1.7–7.7)
Neutrophils Relative %: 82 %
Platelets: 351 10*3/uL (ref 150–400)
RBC: 3.86 MIL/uL — ABNORMAL LOW (ref 3.87–5.11)
RDW: 20.3 % — ABNORMAL HIGH (ref 11.5–15.5)
WBC: 14.7 10*3/uL — ABNORMAL HIGH (ref 4.0–10.5)
nRBC: 0 % (ref 0.0–0.2)

## 2020-10-19 LAB — URINALYSIS, COMPLETE (UACMP) WITH MICROSCOPIC
Bacteria, UA: NONE SEEN
Bilirubin Urine: NEGATIVE
Glucose, UA: NEGATIVE mg/dL
Hgb urine dipstick: NEGATIVE
Ketones, ur: NEGATIVE mg/dL
Leukocytes,Ua: NEGATIVE
Nitrite: NEGATIVE
Protein, ur: NEGATIVE mg/dL
Specific Gravity, Urine: 1.014 (ref 1.005–1.030)
pH: 5 (ref 5.0–8.0)

## 2020-10-19 LAB — PROTIME-INR
INR: 2 — ABNORMAL HIGH (ref 0.8–1.2)
Prothrombin Time: 23 seconds — ABNORMAL HIGH (ref 11.4–15.2)

## 2020-10-19 LAB — COMPREHENSIVE METABOLIC PANEL
ALT: 17 U/L (ref 0–44)
AST: 19 U/L (ref 15–41)
Albumin: 2.1 g/dL — ABNORMAL LOW (ref 3.5–5.0)
Alkaline Phosphatase: 140 U/L — ABNORMAL HIGH (ref 38–126)
Anion gap: 9 (ref 5–15)
BUN: 11 mg/dL (ref 8–23)
CO2: 31 mmol/L (ref 22–32)
Calcium: 7.7 mg/dL — ABNORMAL LOW (ref 8.9–10.3)
Chloride: 98 mmol/L (ref 98–111)
Creatinine, Ser: 1.13 mg/dL — ABNORMAL HIGH (ref 0.44–1.00)
GFR, Estimated: 53 mL/min — ABNORMAL LOW (ref >60)
Glucose, Bld: 128 mg/dL — ABNORMAL HIGH (ref 70–99)
Potassium: 2.5 mmol/L — CL (ref 3.5–5.1)
Sodium: 138 mmol/L (ref 135–145)
Total Bilirubin: 1.5 mg/dL — ABNORMAL HIGH (ref 0.3–1.2)
Total Protein: 6.1 g/dL — ABNORMAL LOW (ref 6.5–8.1)

## 2020-10-19 LAB — C-REACTIVE PROTEIN: CRP: 17.1 mg/dL — ABNORMAL HIGH (ref <1.0)

## 2020-10-19 LAB — RESP PANEL BY RT-PCR (FLU A&B, COVID) ARPGX2
Influenza A by PCR: NEGATIVE
Influenza B by PCR: NEGATIVE
SARS Coronavirus 2 by RT PCR: NEGATIVE

## 2020-10-19 LAB — GLUCOSE, CAPILLARY: Glucose-Capillary: 147 mg/dL — ABNORMAL HIGH (ref 70–99)

## 2020-10-19 LAB — LACTIC ACID, PLASMA
Lactic Acid, Venous: 2.5 mmol/L (ref 0.5–1.9)
Lactic Acid, Venous: 2 mmol/L (ref 0.5–1.9)

## 2020-10-19 LAB — SEDIMENTATION RATE: Sed Rate: 39 mm/hr — ABNORMAL HIGH (ref 0–30)

## 2020-10-19 LAB — APTT: aPTT: 48 seconds — ABNORMAL HIGH (ref 24–36)

## 2020-10-19 MED ORDER — INSULIN ASPART 100 UNIT/ML IJ SOLN: 0.0000 [IU] | Freq: Every day | INTRAMUSCULAR | Status: DC

## 2020-10-19 MED ORDER — ELIQUIS 5 MG PO TABS: 5.0000 mg | ORAL_TABLET | Freq: Two times a day (BID) | ORAL | 1 refills | Status: AC

## 2020-10-19 MED ORDER — POTASSIUM CHLORIDE 10 MEQ/100ML IV SOLN
10.0000 meq | INTRAVENOUS | Status: AC
  Administered 2020-10-19 – 2020-10-20 (×4): 10 meq via INTRAVENOUS
  Filled 2020-10-19 (×3): qty 100

## 2020-10-19 MED ORDER — OXYBUTYNIN CHLORIDE 5 MG PO TABS
5.0000 mg | ORAL_TABLET | Freq: Two times a day (BID) | ORAL | Status: DC
  Administered 2020-10-19 – 2020-10-21 (×4): 5 mg via ORAL
  Filled 2020-10-19 (×8): qty 1

## 2020-10-19 MED ORDER — MORPHINE SULFATE (PF) 4 MG/ML IV SOLN
4.0000 mg | Freq: Once | INTRAVENOUS | Status: AC
  Administered 2020-10-19: 4 mg via INTRAVENOUS
  Filled 2020-10-19: qty 1

## 2020-10-19 MED ORDER — ONDANSETRON HCL 4 MG/2ML IJ SOLN
4.0000 mg | Freq: Once | INTRAMUSCULAR | Status: AC
  Administered 2020-10-19: 4 mg via INTRAVENOUS
  Filled 2020-10-19: qty 2

## 2020-10-19 MED ORDER — AMIODARONE HCL 200 MG PO TABS
200.0000 mg | ORAL_TABLET | Freq: Two times a day (BID) | ORAL | Status: DC
  Administered 2020-10-19 – 2020-10-21 (×4): 200 mg via ORAL
  Filled 2020-10-19 (×4): qty 1

## 2020-10-19 MED ORDER — LACTATED RINGERS IV SOLN
INTRAVENOUS | Status: AC
Start: 2020-10-19 — End: 2020-10-20

## 2020-10-19 MED ORDER — METRONIDAZOLE 500 MG/100ML IV SOLN
500.0000 mg | Freq: Once | INTRAVENOUS | Status: AC
  Administered 2020-10-19: 500 mg via INTRAVENOUS
  Filled 2020-10-19: qty 100

## 2020-10-19 MED ORDER — TIZANIDINE HCL 4 MG PO TABS
4.0000 mg | ORAL_TABLET | Freq: Three times a day (TID) | ORAL | Status: DC | PRN
  Administered 2020-10-20: 4 mg via ORAL
  Filled 2020-10-19 (×3): qty 1

## 2020-10-19 MED ORDER — SEMAGLUTIDE(0.25 OR 0.5MG/DOS) 2 MG/1.5ML ~~LOC~~ SOPN: 0.5000 mg | PEN_INJECTOR | SUBCUTANEOUS | Status: DC

## 2020-10-19 MED ORDER — AMLODIPINE BESYLATE 5 MG PO TABS
5.0000 mg | ORAL_TABLET | Freq: Every day | ORAL | Status: DC
  Administered 2020-10-20 – 2020-10-21 (×2): 5 mg via ORAL
  Filled 2020-10-19 (×2): qty 1

## 2020-10-19 MED ORDER — INSULIN GLARGINE 100 UNIT/ML ~~LOC~~ SOLN
6.0000 [IU] | Freq: Every day | SUBCUTANEOUS | Status: DC
  Administered 2020-10-20 – 2020-10-25 (×6): 6 [IU] via SUBCUTANEOUS
  Filled 2020-10-19 (×7): qty 0.06

## 2020-10-19 MED ORDER — SODIUM CHLORIDE 0.9 % IV SOLN
2.0000 g | Freq: Once | INTRAVENOUS | Status: AC
  Administered 2020-10-19: 2 g via INTRAVENOUS
  Filled 2020-10-19: qty 2

## 2020-10-19 MED ORDER — ACETAMINOPHEN 325 MG PO TABS
650.0000 mg | ORAL_TABLET | Freq: Four times a day (QID) | ORAL | Status: DC | PRN
  Administered 2020-10-19 – 2020-10-20 (×2): 650 mg via ORAL
  Filled 2020-10-19 (×2): qty 2

## 2020-10-19 MED ORDER — KETOROLAC TROMETHAMINE 15 MG/ML IJ SOLN
15.0000 mg | Freq: Three times a day (TID) | INTRAMUSCULAR | Status: AC | PRN
  Administered 2020-10-19 – 2020-10-21 (×3): 15 mg via INTRAVENOUS
  Filled 2020-10-19 (×4): qty 1

## 2020-10-19 MED ORDER — SODIUM CHLORIDE 0.9 % IV SOLN
2.0000 g | Freq: Three times a day (TID) | INTRAVENOUS | Status: AC
  Administered 2020-10-20 – 2020-10-21 (×5): 2 g via INTRAVENOUS
  Filled 2020-10-19 (×9): qty 2

## 2020-10-19 MED ORDER — VANCOMYCIN HCL IN DEXTROSE 1-5 GM/200ML-% IV SOLN: 1000.0000 mg | Freq: Once | INTRAVENOUS | Status: DC

## 2020-10-19 MED ORDER — MORPHINE SULFATE (PF) 2 MG/ML IV SOLN
1.0000 mg | INTRAVENOUS | Status: AC | PRN
  Administered 2020-10-20 – 2020-10-23 (×3): 1 mg via INTRAVENOUS
  Filled 2020-10-19 (×4): qty 1

## 2020-10-19 MED ORDER — PANTOPRAZOLE SODIUM 40 MG PO TBEC
40.0000 mg | DELAYED_RELEASE_TABLET | Freq: Every day | ORAL | Status: DC
  Administered 2020-10-20 – 2020-10-21 (×2): 40 mg via ORAL
  Filled 2020-10-19 (×2): qty 1

## 2020-10-19 MED ORDER — VITAMIN D 25 MCG (1000 UNIT) PO TABS
2000.0000 [IU] | ORAL_TABLET | Freq: Every day | ORAL | Status: DC
  Administered 2020-10-20 – 2020-10-21 (×2): 2000 [IU] via ORAL
  Filled 2020-10-19 (×2): qty 2

## 2020-10-19 MED ORDER — SODIUM CHLORIDE 0.9 % IV SOLN: Freq: Once | INTRAVENOUS | Status: AC

## 2020-10-19 MED ORDER — ATORVASTATIN CALCIUM 20 MG PO TABS
80.0000 mg | ORAL_TABLET | Freq: Every day | ORAL | Status: DC
  Administered 2020-10-19 – 2020-10-22 (×3): 80 mg via ORAL
  Filled 2020-10-19 (×3): qty 4

## 2020-10-19 MED ORDER — CERAVE EX LOTN: TOPICAL_LOTION | Freq: Two times a day (BID) | CUTANEOUS | Status: DC | PRN

## 2020-10-19 MED ORDER — DULOXETINE HCL 30 MG PO CPEP
60.0000 mg | ORAL_CAPSULE | Freq: Every day | ORAL | Status: DC
  Administered 2020-10-20 – 2020-10-21 (×2): 60 mg via ORAL
  Filled 2020-10-19 (×2): qty 1

## 2020-10-19 MED ORDER — LACTATED RINGERS IV BOLUS (SEPSIS)
1000.0000 mL | Freq: Once | INTRAVENOUS | Status: AC
  Administered 2020-10-19: 1000 mL via INTRAVENOUS

## 2020-10-19 MED ORDER — HYDRALAZINE HCL 50 MG PO TABS: 25.0000 mg | ORAL_TABLET | Freq: Three times a day (TID) | ORAL | Status: AC | PRN

## 2020-10-19 MED ORDER — POTASSIUM CHLORIDE CRYS ER 20 MEQ PO TBCR
40.0000 meq | EXTENDED_RELEASE_TABLET | Freq: Once | ORAL | Status: AC
  Administered 2020-10-19: 40 meq via ORAL
  Filled 2020-10-19: qty 2

## 2020-10-19 MED ORDER — INSULIN ASPART 100 UNIT/ML IJ SOLN
0.0000 [IU] | Freq: Three times a day (TID) | INTRAMUSCULAR | Status: DC
  Administered 2020-10-20: 3 [IU] via SUBCUTANEOUS
  Administered 2020-10-21: 5 [IU] via SUBCUTANEOUS
  Administered 2020-10-22 (×2): 3 [IU] via SUBCUTANEOUS
  Filled 2020-10-19 (×3): qty 1

## 2020-10-19 MED ORDER — ACETAMINOPHEN 650 MG RE SUPP: 650.0000 mg | Freq: Four times a day (QID) | RECTAL | Status: DC | PRN

## 2020-10-19 MED ORDER — HEPARIN SODIUM (PORCINE) 5000 UNIT/ML IJ SOLN: 5000.0000 [IU] | Freq: Three times a day (TID) | INTRAMUSCULAR | Status: DC

## 2020-10-19 MED ORDER — ONDANSETRON HCL 4 MG/2ML IJ SOLN
4.0000 mg | Freq: Four times a day (QID) | INTRAMUSCULAR | Status: DC | PRN
  Filled 2020-10-19: qty 2

## 2020-10-19 MED ORDER — VANCOMYCIN HCL 1250 MG/250ML IV SOLN
1250.0000 mg | INTRAVENOUS | Status: DC
  Administered 2020-10-20 – 2020-10-21 (×2): 1250 mg via INTRAVENOUS
  Filled 2020-10-19 (×4): qty 250

## 2020-10-19 MED ORDER — VANCOMYCIN HCL 2000 MG/400ML IV SOLN
2000.0000 mg | Freq: Once | INTRAVENOUS | Status: AC
  Administered 2020-10-19: 2000 mg via INTRAVENOUS
  Filled 2020-10-19: qty 400

## 2020-10-19 MED ORDER — ONDANSETRON HCL 4 MG PO TABS: 4.0000 mg | ORAL_TABLET | Freq: Four times a day (QID) | ORAL | Status: DC | PRN

## 2020-10-19 MED ORDER — METOPROLOL TARTRATE 50 MG PO TABS
50.0000 mg | ORAL_TABLET | Freq: Two times a day (BID) | ORAL | Status: DC
  Administered 2020-10-19 – 2020-10-21 (×4): 50 mg via ORAL
  Filled 2020-10-19 (×4): qty 1

## 2020-10-19 MED ORDER — PREGABALIN 50 MG PO CAPS
100.0000 mg | ORAL_CAPSULE | Freq: Three times a day (TID) | ORAL | Status: DC
  Administered 2020-10-19 – 2020-10-21 (×6): 100 mg via ORAL
  Filled 2020-10-19 (×6): qty 2

## 2020-10-19 NOTE — Sepsis Progress Note (Signed)
[place] Tracking the Code Sepsis.

## 2020-10-19 NOTE — ED Triage Notes (Signed)
Pt to ED from wound clinic for infection on heel of right foot x 3 weeks. Foot wrapped on arrival Sent for IV antibiotics.  Pt in NAD, alert and oriented.

## 2020-10-19 NOTE — ED Notes (Signed)
Baseline EKG completed.

## 2020-10-19 NOTE — ED Notes (Signed)
Floor RN P.N. notified to complete ED to IP handoff.

## 2020-10-19 NOTE — Consult Note (Signed)
ORTHOPAEDIC CONSULTATION  REQUESTING PHYSICIAN: Cox, Amy N, DO  Chief Complaint: Worsening right heel ulceration.  HPI: Melinda [name] is a 65 y.o. female who complains of worsening right heel ulceration.  Has been followed in the outpatient clinic by wound care.  They have been seen weekly.  Unfortunately when patient presented today they noted worsening changes of the right heel with foul odor and necrotic tissue.  Sent to the ER for further evaluation and work-up.  Has a longstanding history of peripheral vascular disease.  History of diabetes with diabetic neuropathy  Upon admission white blood cell count at 14.7.  Past Medical History:  Diagnosis Date   Anxiety    Atrial fibrillation (HCC)    Atrial flutter ([place])    Coronary artery disease    a. 06/2014 NSTEMI s/p CABG x 4 (LIMA to LAD, VG to Diag, VG to OM, VG to PDA).   Depression    Diabetic neuropathy (HCC)    GERD (gastroesophageal reflux disease)    HTN (hypertension)    Hyperlipidemia LDL goal <70    IDDM (insulin dependent diabetes mellitus)    Ischemic cardiomyopathy    a. 06/2014 Echo: EF 40-45%, HK of entire inferolateral and inferior myocardium c/w infarct of RCA/LCx, GR2DD, mild MR   Myocardial infarction ([place]) 2016   OA (osteoarthritis) of knee    Obesity    Osteomyelitis ([place])    Peripheral neuropathy    Peripheral vascular disease ([place])    a. 09/2016 Periph Angio: CTO R popliteal, CTO L prox/mid SFA->Med Rx; b. 05/2017 PTA: LSFA (Viabahn covered stent x 2), DEB to L Post Tibial; c. 06/2017 ABI: R 0.44, L 1.05.   Tobacco abuse    Past Surgical History:  Procedure Laterality Date   ABDOMINAL AORTOGRAM W/LOWER EXTREMITY N/A 10/27/2016   Procedure: Abdominal Aortogram w/Lower Extremity;  Surgeon: Nelva Bush, MD;  Location: [facility];  Service: Cardiovascular;  Laterality: N/A;   AMPUTATION TOE Left 10/21/2015   Procedure: AMPUTATION TOE;  Surgeon: Sharlotte Alamo, DPM;  Location: [facility];  Service:  Podiatry;  Laterality: Left;   AMPUTATION TOE Left 05/15/2017   Procedure: AMPUTATION LEFT GREAT TOE;  Surgeon: Sharlotte Alamo, DPM;  Location: [facility];  Service: Podiatry;  Laterality: Left;   AORTIC VALVE REPLACEMENT (AVR)/CORONARY ARTERY BYPASS GRAFTING (CABG)     BREAST BIOPSY     CARDIAC CATHETERIZATION  06/2014   95% stenosis mLAD, occlusion ostial OM1, 70% stenosis LCx, 95% stenosis mRCA, EF 45%.   CARDIOVERSION N/A 04/14/2020   Procedure: CARDIOVERSION with TEE;  Surgeon: Minna Merritts, MD;  Location: [facility];  Service: Cardiovascular;  Laterality: N/A;   CARDIOVERSION N/A 05/28/2020   Procedure: CARDIOVERSION;  Surgeon: Minna Merritts, MD;  Location: [facility];  Service: Cardiovascular;  Laterality: N/A;   CORONARY ARTERY BYPASS GRAFT N/A 06/08/2014   Procedure: CORONARY ARTERY BYPASS GRAFTING (CABG);  Surgeon: Grace Isaac, MD;  Location: [facility];  Service: Open Heart Surgery;  Laterality: N/A;  Times 4 using left internal mammary artery to LAD artery and endoscopically harvested bilateral saphenous vein to Obtuse Marginal, Diagonal and Posterior Descending coronary arteries.   CT ABD W & PELVIS WO CM  06/2014   nl liver, gallbladder, spleen, mild diverticular changes, no bowel wall inflammation, appendix nl, no hernia, no other sig abnormalities   GASTRIC ROUX-EN-Y N/A 02/09/2020   Procedure: LAPAROSCOPIC ROUX-EN-Y GASTRIC BYPASS WITH UPPER ENDOSCOPY;  Surgeon: Johnathan Hausen, MD;  Location: [facility];  Service: General;  Laterality: N/A;   LOWER EXTREMITY ANGIOGRAPHY Left 05/23/2017   Procedure: LOWER EXTREMITY ANGIOGRAPHY;  Surgeon: Algernon Huxley, MD;  Location: [facility];  Service: Cardiovascular;  Laterality: Left;   LOWER EXTREMITY ANGIOGRAPHY Left 05/28/2017   Procedure: LOWER EXTREMITY ANGIOGRAPHY;  Surgeon: Algernon Huxley, MD;  Location: [facility];  Service: Cardiovascular;  Laterality: Left;   LOWER EXTREMITY ANGIOGRAPHY Left 12/16/2018   Procedure: LOWER  EXTREMITY ANGIOGRAPHY;  Surgeon: Algernon Huxley, MD;  Location: [facility];  Service: Cardiovascular;  Laterality: Left;   LOWER EXTREMITY ANGIOGRAPHY Right 12/23/2018   Procedure: LOWER EXTREMITY ANGIOGRAPHY;  Surgeon: Algernon Huxley, MD;  Location: [facility];  Service: Cardiovascular;  Laterality: Right;   TEE WITHOUT CARDIOVERSION N/A 06/08/2014   Procedure: TRANSESOPHAGEAL ECHOCARDIOGRAM (TEE);  Surgeon: Grace Isaac, MD;  Location: [facility];  Service: Open Heart Surgery;  Laterality: N/A;   TEE WITHOUT CARDIOVERSION N/A 04/14/2020   Procedure: TRANSESOPHAGEAL ECHOCARDIOGRAM (TEE);  Surgeon: Minna Merritts, MD;  Location: [facility];  Service: Cardiovascular;  Laterality: N/A;   TOE AMPUTATION Right 12/2011   rt middle toe   UPPER GI ENDOSCOPY N/A 02/09/2020   Procedure: UPPER GI ENDOSCOPY;  Surgeon: Johnathan Hausen, MD;  Location: [facility];  Service: General;  Laterality: N/A;   US ECHOCARDIOGRAPHY  06/2014   EF 50-55%, HK of inf/post/inferolat walls, Ao sclerosis   Social History   Socioeconomic History   Marital status: Widowed    Spouse name: Not on file   Number of children: Not on file   Years of education: Not on file   Highest education level: Not on file  Occupational History   Not on file  Tobacco Use   Smoking status: Former    Packs/day: 0.50    Years: 40.00    Pack years: 20.00    Types: Cigarettes   Smokeless tobacco: Never   Tobacco comments:    started Chantix 04/12/20  Vaping Use   Vaping Use: Never used  Substance and Sexual Activity   Alcohol use: No    Alcohol/week: 0.0 standard drinks   Drug use: Not Currently    Comment: crack   Sexual activity: Not on file  Other Topics Concern   Not on file  Social History Narrative   Not on file   Social Determinants of Health   Financial Resource Strain: Not on file  Food Insecurity: Not on file  Transportation Needs: Not on file  Physical Activity: Not on file  Stress: Not on file   Social Connections: Not on file   Family History  Problem Relation Age of Onset   CAD Mother    Hypertension Mother    Diabetes Mother    CAD Father    Diabetes Father    Sickle cell trait Other    Asthma Other    No Known Allergies Prior to Admission medications   Medication Sig Start Date End Date Taking? Authorizing Provider  amiodarone (PACERONE) 200 MG tablet Take 1 tablet (200 mg total) by mouth 2 (two) times daily. 09/03/20  Yes Hendricks Limes, MD  amLODipine (NORVASC) 5 MG tablet Take 5 mg by mouth daily.   Yes [provider]  apixaban (ELIQUIS) 5 MG TABS tablet Take 1 tablet (5 mg total) by mouth 2 (two) times daily. 10/16/2020  Yes Vickie Epley, MD  atorvastatin (LIPITOR) 80 MG tablet Take 1 tablet (80 mg total) by mouth daily at 6 PM. 09/03/20  Yes Hendricks Limes, MD  Cholecalciferol (VITAMIN D3) 50 MCG (2000 UT) TABS Take 2,000 Units by mouth daily.   Yes [provider]  clotrimazole (LOTRIMIN) 1 % cream Apply 1 application topically 2 (two) times daily as needed.   Yes [provider]  docusate sodium (COLACE) 100 MG capsule Take 100 mg by mouth 2 (two) times daily.   Yes [provider]  DULoxetine (CYMBALTA) 60 MG capsule Take 1 capsule (60 mg total) by mouth daily. 09/03/20  Yes Hendricks Limes, MD  Emollient (CERAVE EX) Apply 1 application topically 2 (two) times daily as needed (dry skin).   Yes [provider]  furosemide (LASIX) 20 MG tablet Take 2 tablets (40 mg total) by mouth daily as needed (fluid retention.). 09/03/20  Yes Hendricks Limes, MD  insulin aspart (NOVOLOG) 100 UNIT/ML injection Inject 4 Units into the skin 3 (three) times daily with meals. 08/25/20  Yes Wieting, Richard, MD  metoprolol tartrate (LOPRESSOR) 50 MG tablet TAKE 1 TABLET TWICE DAILY Patient taking differently: Take 50 mg by mouth 2 (two) times daily. 09/24/20  Yes Vickie Epley, MD  oxybutynin (DITROPAN) 5 MG tablet Take 5 mg by mouth  2 (two) times daily.  05/10/17  Yes [provider]  pantoprazole (PROTONIX) 40 MG tablet Take 1 tablet (40 mg total) by mouth daily. 09/03/20  Yes Hendricks Limes, MD  potassium chloride SA (KLOR-CON) 20 MEQ tablet TAKE 1 TABLET (20 MEQ TOTAL) BY MOUTH DAILY AS NEEDED. Patient taking differently: Take 20 mEq by mouth daily as needed (potassium replacement with furosemide). 09/03/20  Yes Hendricks Limes, MD  pregabalin (LYRICA) 100 MG capsule Take 1 capsule (100 mg total) by mouth 3 (three) times daily. 09/08/20  Yes Medina-Vargas, Monina C, NP  Semaglutide,0.25 or 0.5MG /DOS, (OZEMPIC, 0.25 OR 0.5 MG/DOSE,) 2 MG/1.5ML SOPN Inject 0.5 mg into the skin every Friday.   Yes [provider]  tiZANidine (ZANAFLEX) 4 MG capsule Take 1 capsule (4 mg total) by mouth 3 (three) times daily as needed for muscle spasms. 09/06/20  Yes Medina-Vargas, Monina C, NP  diltiazem (CARDIZEM) 60 MG tablet Take 1 tablet (60 mg total) by mouth 2 (two) times daily. Patient not taking: No sig reported 09/03/20   Hendricks Limes, MD  insulin glargine (LANTUS) 100 UNIT/ML injection Inject 0.06 mLs (6 Units total) into the skin at bedtime. Patient not taking: No sig reported 09/03/20   Hendricks Limes, MD   DG Os Calcis Right  Result Date: 10/03/2020 CLINICAL DATA:  Open wound heel EXAM: RIGHT OS CALCIS - 2+ VIEW COMPARISON:  02/18/2019 FINDINGS: Negative for fracture or osteomyelitis. Mild calcaneal spurring.  Mild arterial calcification. IMPRESSION: No acute skeletal abnormality. Electronically Signed   By: Franchot Gallo M.D.   On: 10/18/2020 16:24    Positive ROS: All other systems have been reviewed and were otherwise negative with the exception of those mentioned in the HPI and as above.  12 point ROS was performed.  Physical Exam: General: Alert and oriented.  No apparent distress.  Vascular:  Left foot:Dorsalis Pedis:  absent Posterior Tibial:  absent  Right foot: Dorsalis Pedis:  absent Posterior  Tibial:  absent  Neuro:absent protective sensation  Derm: Posterior lateral aspect of the right heel has a large necrotic ulceration with foul odor.  Surrounding superficial necrosis tissue around the posterior aspect of the heel.  The ulcer site probes directly down to bone.  Loose necrotic tissue into the central  aspect of the ulceration is noted as well.  Patient has small superficial ulcerative lesions of the lower extremities diffusely throughout at this time as well.  Ortho/MS: Diffuse edema to the right lower leg.  Assessment: Necrotic posterior lateral right heel ulceration with infection Diabetes with neuropathy with diabetic foot ulcer Severe peripheral vascular disease  Plan: We will order an MRI to further evaluate the posterior aspect of the right heel.  I suspect this is likely infected.  She has obvious superficial malodor and necrotic tissue.  X-rays was benign fortunately.  We will consult vascular surgery as well for evaluation.  Patient is at high risk of needing amputation of the right lower extremity.  If MRI shows diffuse osteomyelitis of the right heel I doubt there is much in the way of limb salvage that can be done.  We will follow-up to discuss further after MRI has been performed.    Elesa Hacker, DPM Cell [phone]   10/10/2020 8:51 PM

## 2020-10-19 NOTE — ED Notes (Signed)
Called phlebotomy to assist in collection of 2nd set of cultures. Will have a NT look while waiting on phlebotomy.

## 2020-10-19 NOTE — ED Notes (Signed)
Phlebotomy at bedside.

## 2020-10-19 NOTE — Sepsis Progress Note (Signed)
Notified bedside nurse of need to administer antibiotics.  

## 2020-10-19 NOTE — ED Notes (Signed)
Called floor to reassign floor RN as previous RN refused report at [phone] as she was leaving for the night at 1900 and no new RN assigned yet.

## 2020-10-19 NOTE — Telephone Encounter (Signed)
*  STAT* If patient is at the pharmacy, call can be transferred to refill team.   1. Which medications need to be refilled? (please list name of each medication and dose if known) Eliquis     2. Which pharmacy/location (including street and city if local pharmacy) is medication to be sent to? [facility] ([facility]  3. Do they need a 30 day or 90 day supply? [place]

## 2020-10-19 NOTE — ED Provider Notes (Signed)
Medical screening examination/treatment/procedure(s) were conducted as a shared visit with non-physician practitioner(s) and myself.  I personally evaluated the patient during the encounter.    Saw and evaluated the patient.  Patient does report moderate pain in the right foot.  This issue has been ongoing now for several weeks, worsening.  She saw wound clinic today and reports she was told to come to the hospital.  Affirmed that the patient was told to come to the ED for admission for IV antibiotics and wound care consultation/podiatry.  Patient is understanding agreeable with plan for admission.  Of note she does appear to meet sepsis criteria at this time.  Broad-spectrum antibiotics   Delman Kitten, MD 10/14/2020 1658

## 2020-10-19 NOTE — H&P (Signed)
History and Physical   Melinda [name]:[id number] DOB: 01/31/1956 DOA: 10/12/2020  PCP: Donnie Coffin, MD  Outpatient Specialists: Dr. Cleda Mccreedy, podiatry Patient coming from: wound clinic  I have personally briefly reviewed patient's old medical records in [place].  Chief Concern: right heel wound  HPI: Melinda [name] is a 65 y.o. female with medical history significant for CKD 3B, insulin-dependent diabetes mellitus, anxiety, depression, hypertension, osteoarthritis, peripheral artery disease, sickle cell trait, tobacco dependency, status post left first toe amputation, right toe amputation, who presents to the emergency department for chief concerns of right heel wound.  She states that the wound started from skin peeling and she pulled it off about 3.5-4 weeks ago.  She follows with wound consult for left 1st toe wound, weekly. She was told by her wound care that she needs to come into the ED for evaluation of the right heel wound. She denies fever. She endorses nausea and vomiting x5 in the last 3 weeks.  She denies diet changes.  Social history: lives by herself. She smokes 1 cigarettes every 2 or 3 days. She denies etoh and recreational drug use. She is retired and formerly worked as a Programmer, applications  She endorses chronic shortness of breath that is unchanged over the last 5 years.  Vaccination history: she is vaccinated for covid 19, with two doses of Moderna  ROS: Constitutional: no weight change, no fever ENT/Mouth: no sore throat, no rhinorrhea Eyes: no eye pain, no vision changes Cardiovascular: no chest pain, no dyspnea,  no edema, no palpitations Respiratory: no cough, no sputum, no wheezing Gastrointestinal: + nausea, + vomiting, no diarrhea, no constipation Genitourinary: no urinary incontinence, no dysuria, no hematuria Musculoskeletal: no arthralgias, no myalgias Skin: no skin lesions, no pruritus, Neuro: + weakness, no loss of consciousness, no  syncope Psych: no anxiety, no depression, + decrease appetite Heme/Lymph: no bruising, no bleeding  ED Course: Discussed with ED provider, patient requiring hospitalization for right heel wound.  Labs in the emergency department was remarkable for WBC elevated at 14.7, hemoglobin 10, platelets 351, elevated lactic acid of 2.5, INR 2.0, potassium 2.5, sodium 138, chloride 98, BUN 11, serum creatinine of 1.13.  In the emergency department patient received morphine 4 mg IV, Zofran 4 mg IV, Flagyl 500 mg IV, vancomycin, cefepime for sepsis.  Blood cultures have been collected and is in process.  Assessment/Plan  Principal Problem:   Sepsis ([place]) Active Problems:   Essential hypertension   S/P CABG x 4   Obesity   Hyperglycemia   Amputated toe, left (HCC)   Amputated toe, right (HCC)   S/P gastric bypass   CKD (chronic kidney disease), stage IIIa   Depression   AF (paroxysmal atrial fibrillation) ([place])   Wound infection   # Patient meet sepsis criteria with elevated heart rate, WBC, lactic acid, source of right heel wound # Right heel wound-present admission - Query osteomyelitis - Patient is status post lactated Ringer 1 L bolus, LR IVF at 150 mL/h - Patient is maintaining appropriate MAP no indication for fluid bolus at this time - Flagyl is not indicated at this time - Vanco and cefepime per pharmacy - Podiatry has been consulted for I&D - Sed rate, CRP ordered for baseline - Discuss with podiatrist, Dr. Vickki Muff regarding MRI needed, pending his evaluation at this time  # Hypertension-amlodipine 5 mg nightly resumed, metoprolol tartrate 50 mg twice daily resumed  # Atrial fibrillation-on amiodarone 200 mg twice daily, Eliquis  5 mg twice daily - Her last dose of Eliquis was 10/20/2018 2 in the AM - Holding Eliquis at this time  # Insulin-dependent diabetes mellitus - Insulin SSI with at bedtime coverage ordered  # Hyperlipidemia-atorvastatin 80 mg nightly resumed  #  History of PAD-podiatry would like vascular evaluation and Dr. Vickki Muff states he will put in an order for vascular consultation in case patient will need a BKA in the setting of osteomyelitis  # CKD 3A-at baseline  # DVT prophylaxis- A.m. team to initiate DVT prophylaxis when appropriate -Patient last occurred Eliquis dose at 10/20/2018 2 in the AM  # Pain control: Ketorolac 15 mg IV every 8 hours as needed for moderate pain, 2 days ordered; morphine 1 mg IV every 4 hours as needed for severe pain, 3 doses ordered  Chart reviewed.   DVT prophylaxis: Patient is currently supratherapeutic as patient is on Eliquis with elevated INR Code Status: Full code Diet: Heart healthy/carb modified Family Communication: No Disposition Plan: Pending clinical course Consults called: Podiatry Admission status: MedSurg, observation, 24 hours of telemetry ordered  Past Medical History:  Diagnosis Date   Anxiety    Atrial fibrillation ([place])    Atrial flutter ([place])    Coronary artery disease    a. 06/2014 NSTEMI s/p CABG x 4 (LIMA to LAD, VG to Diag, VG to OM, VG to PDA).   Depression    Diabetic neuropathy (HCC)    GERD (gastroesophageal reflux disease)    HTN (hypertension)    Hyperlipidemia LDL goal <70    IDDM (insulin dependent diabetes mellitus)    Ischemic cardiomyopathy    a. 06/2014 Echo: EF 40-45%, HK of entire inferolateral and inferior myocardium c/w infarct of RCA/LCx, GR2DD, mild MR   Myocardial infarction ([place]) 2016   OA (osteoarthritis) of knee    Obesity    Osteomyelitis ([place])    Peripheral neuropathy    Peripheral vascular disease ([place])    a. 09/2016 Periph Angio: CTO R popliteal, CTO L prox/mid SFA->Med Rx; b. 05/2017 PTA: LSFA (Viabahn covered stent x 2), DEB to L Post Tibial; c. 06/2017 ABI: R 0.44, L 1.05.   Tobacco abuse    Past Surgical History:  Procedure Laterality Date   ABDOMINAL AORTOGRAM W/LOWER EXTREMITY N/A 10/27/2016   Procedure: Abdominal Aortogram w/Lower  Extremity;  Surgeon: Nelva Bush, MD;  Location: [facility];  Service: Cardiovascular;  Laterality: N/A;   AMPUTATION TOE Left 10/21/2015   Procedure: AMPUTATION TOE;  Surgeon: Sharlotte Alamo, DPM;  Location: [facility];  Service: Podiatry;  Laterality: Left;   AMPUTATION TOE Left 05/15/2017   Procedure: AMPUTATION LEFT GREAT TOE;  Surgeon: Sharlotte Alamo, DPM;  Location: [facility];  Service: Podiatry;  Laterality: Left;   AORTIC VALVE REPLACEMENT (AVR)/CORONARY ARTERY BYPASS GRAFTING (CABG)     BREAST BIOPSY     CARDIAC CATHETERIZATION  06/2014   95% stenosis mLAD, occlusion ostial OM1, 70% stenosis LCx, 95% stenosis mRCA, EF 45%.   CARDIOVERSION N/A 04/14/2020   Procedure: CARDIOVERSION with TEE;  Surgeon: Minna Merritts, MD;  Location: [facility];  Service: Cardiovascular;  Laterality: N/A;   CARDIOVERSION N/A 05/28/2020   Procedure: CARDIOVERSION;  Surgeon: Minna Merritts, MD;  Location: [facility];  Service: Cardiovascular;  Laterality: N/A;   CORONARY ARTERY BYPASS GRAFT N/A 06/08/2014   Procedure: CORONARY ARTERY BYPASS GRAFTING (CABG);  Surgeon: Grace Isaac, MD;  Location: [facility];  Service: Open Heart Surgery;  Laterality: N/A;  Times 4 using left  internal mammary artery to LAD artery and endoscopically harvested bilateral saphenous vein to Obtuse Marginal, Diagonal and Posterior Descending coronary arteries.   CT ABD W & PELVIS WO CM  06/2014   nl liver, gallbladder, spleen, mild diverticular changes, no bowel wall inflammation, appendix nl, no hernia, no other sig abnormalities   GASTRIC ROUX-EN-Y N/A 02/09/2020   Procedure: LAPAROSCOPIC ROUX-EN-Y GASTRIC BYPASS WITH UPPER ENDOSCOPY;  Surgeon: Johnathan Hausen, MD;  Location: [facility];  Service: General;  Laterality: N/A;   LOWER EXTREMITY ANGIOGRAPHY Left 05/23/2017   Procedure: LOWER EXTREMITY ANGIOGRAPHY;  Surgeon: Algernon Huxley, MD;  Location: [facility];  Service: Cardiovascular;  Laterality: Left;   LOWER EXTREMITY  ANGIOGRAPHY Left 05/28/2017   Procedure: LOWER EXTREMITY ANGIOGRAPHY;  Surgeon: Algernon Huxley, MD;  Location: [facility];  Service: Cardiovascular;  Laterality: Left;   LOWER EXTREMITY ANGIOGRAPHY Left 12/16/2018   Procedure: LOWER EXTREMITY ANGIOGRAPHY;  Surgeon: Algernon Huxley, MD;  Location: [facility];  Service: Cardiovascular;  Laterality: Left;   LOWER EXTREMITY ANGIOGRAPHY Right 12/23/2018   Procedure: LOWER EXTREMITY ANGIOGRAPHY;  Surgeon: Algernon Huxley, MD;  Location: [facility];  Service: Cardiovascular;  Laterality: Right;   TEE WITHOUT CARDIOVERSION N/A 06/08/2014   Procedure: TRANSESOPHAGEAL ECHOCARDIOGRAM (TEE);  Surgeon: Grace Isaac, MD;  Location: [facility];  Service: Open Heart Surgery;  Laterality: N/A;   TEE WITHOUT CARDIOVERSION N/A 04/14/2020   Procedure: TRANSESOPHAGEAL ECHOCARDIOGRAM (TEE);  Surgeon: Minna Merritts, MD;  Location: [facility];  Service: Cardiovascular;  Laterality: N/A;   TOE AMPUTATION Right 12/2011   rt middle toe   UPPER GI ENDOSCOPY N/A 02/09/2020   Procedure: UPPER GI ENDOSCOPY;  Surgeon: Johnathan Hausen, MD;  Location[facility];  Service: General;  Laterality: N/A;   US ECHOCARDIOGRAPHY  06/2014   EF 50-55%, HK of inf/post/inferolat walls, Ao sclerosis   Social History:  reports that she has quit smoking. Her smoking use included cigarettes. She has a 20.00 pack-year smoking history. She has never used smokeless tobacco. She reports previous drug use. She reports that she does not drink alcohol.  No Known Allergies Family History  Problem Relation Age of Onset   CAD Mother    Hypertension Mother    Diabetes Mother    CAD Father    Diabetes Father    Sickle cell trait Other    Asthma Other    Family history: Family history reviewed and not pertinent  Prior to Admission medications   Medication Sig Start Date End Date Taking? Authorizing Provider  amiodarone (PACERONE) 200 MG tablet Take 1 tablet (200 mg total) by mouth  2 (two) times daily. 09/03/20   Hendricks Limes, MD  apixaban (ELIQUIS) 5 MG TABS tablet Take 1 tablet (5 mg total) by mouth 2 (two) times daily. 10/10/2020   Vickie Epley, MD  atorvastatin (LIPITOR) 80 MG tablet Take 1 tablet (80 mg total) by mouth daily at 6 PM. 09/03/20   Hendricks Limes, MD  Cholecalciferol (VITAMIN D3) 50 MCG (2000 UT) TABS Take 2,000 Units by mouth daily.    [provider]  clotrimazole (LOTRIMIN) 1 % cream Apply  as directed to affected area twice a day  for fungal infection 06/14/20   [provider]  diltiazem (CARDIZEM) 60 MG tablet Take 1 tablet (60 mg total) by mouth 2 (two) times daily. 09/03/20   Hendricks Limes, MD  docusate sodium (COLACE) 100 MG capsule Take 100 mg  by mouth 2 (two) times daily.    [provider]  DROPLET PEN NEEDLES 31G X 6 MM [place]  11/10/19   [provider]  DULoxetine (CYMBALTA) 60 MG capsule Take 1 capsule (60 mg total) by mouth daily. 09/03/20   Hendricks Limes, MD  Emollient (CERAVE EX) Apply 1 application topically in the morning and at bedtime.    [provider]  furosemide (LASIX) 20 MG tablet Take 2 tablets (40 mg total) by mouth daily as needed (fluid retention.). 09/03/20   Hendricks Limes, MD  insulin aspart (NOVOLOG) 100 UNIT/ML injection Inject 4 Units into the skin 3 (three) times daily with meals. 08/25/20   Loletha Grayer, MD  insulin glargine (LANTUS) 100 UNIT/ML injection Inject 0.06 mLs (6 Units total) into the skin at bedtime. 09/03/20   Hendricks Limes, MD  lidocaine (LIDODERM) 5 % Place 1 patch onto the skin daily as needed (PAIN.).  02/24/19   [provider]  metoprolol tartrate (LOPRESSOR) 50 MG tablet TAKE 1 TABLET TWICE DAILY 09/24/20   Vickie Epley, MD  Multiple Vitamins-Minerals (MULTIVITAMIN WITH MINERALS) tablet Take 1 tablet by mouth in the morning and at bedtime.    [provider]  NON FORMULARY Med Pass Give 159ml po BID    [provider]  oxybutynin (DITROPAN) 5 MG tablet Take 5 mg by mouth 2 (two) times daily.  05/10/17   [provider]  pantoprazole (PROTONIX) 40 MG tablet Take 1 tablet (40 mg total) by mouth daily. 09/03/20   Hendricks Limes, MD  potassium chloride SA (KLOR-CON) 20 MEQ tablet TAKE 1 TABLET (20 MEQ TOTAL) BY MOUTH DAILY AS NEEDED. 09/03/20   Hendricks Limes, MD  pregabalin (LYRICA) 100 MG capsule Take 1 capsule (100 mg total) by mouth 3 (three) times daily. 09/08/20   Medina-Vargas, Monina C, NP  tiZANidine (ZANAFLEX) 4 MG capsule Take 1 capsule (4 mg total) by mouth 3 (three) times daily as needed for muscle spasms. 09/06/20   Medina-Vargas, Monina C, NP  varenicline (CHANTIX) 1 MG tablet Take 1 mg by mouth daily. Patient not taking: Reported on 09/02/2020 07/17/20   [provider]   Physical Exam: Vitals:   10/08/2020 2000 10/01/2020 2015 10/18/2020 2021 10/10/2020 2022  BP: 126/72     Pulse: (!) 103  (!) 102   Resp:  (!) 21  19  Temp:      TempSrc:      SpO2:    96%  Weight:      Height:       Constitutional: appears age-appropriate, NAD, calm, comfortable Eyes: PERRL, lids and conjunctivae normal ENMT: Mucous membranes are moist. Posterior pharynx clear of any exudate or lesions. Age-appropriate dentition. Hearing appropriate Neck: normal, supple, no masses, no thyromegaly Respiratory: clear to auscultation bilaterally, no wheezing, no crackles. Normal respiratory effort. No accessory muscle use.  Cardiovascular: Regular rate and rhythm, no murmurs / rubs / gallops. No extremity edema. 2+ pedal pulses. No carotid bruits.  Abdomen: Obese abdomen, no tenderness, no masses palpated, no hepatosplenomegaly. Bowel sounds positive.  Musculoskeletal: no clubbing / cyanosis.  Toe amputation in bilateral foot. Good ROM, no contractures, no atrophy. Normal muscle tone.  Skin: Right heel wound present admission Neurologic: Sensation intact. Strength 5/5 in all 4.  Psychiatric: Normal  judgment and insight. Alert and oriented x 3. Normal mood.   EKG: independently reviewed, showing sinus tachycardia with rate of 105, QTc 545  Chest x-ray on Admission:  I personally reviewed and I agree with radiologist reading as below.  DG Os Calcis Right  Result Date: 10/16/2020 CLINICAL DATA:  Open wound heel EXAM: RIGHT OS CALCIS - 2+ VIEW COMPARISON:  02/18/2019 FINDINGS: Negative for fracture or osteomyelitis. Mild calcaneal spurring.  Mild arterial calcification. IMPRESSION: No acute skeletal abnormality. Electronically Signed   By: Franchot Gallo M.D.   On: 10/18/2020 16:24    Labs on Admission: I have personally reviewed following labs  CBC: Recent Labs  Lab 10/08/2020 1419  WBC 14.7*  NEUTROABS 12.0*  HGB 10.0*  HCT 29.8*  MCV 77.2*  PLT 350   Basic Metabolic Panel: Recent Labs  Lab 10/20/2020 1419  NA 138  K 2.5*  CL 98  CO2 31  GLUCOSE 128*  BUN 11  CREATININE 1.13*  CALCIUM 7.7*   GFR: Estimated Creatinine Clearance: 63 mL/min (A) (by C-G formula based on SCr of 1.13 mg/dL (H)). Liver Function Tests: Recent Labs  Lab 10/24/2020 1419  AST 19  ALT 17  ALKPHOS 140*  BILITOT 1.5*  PROT 6.1*  ALBUMIN 2.1*   No results for input(s): LIPASE, AMYLASE in the last 168 hours. No results for input(s): AMMONIA in the last 168 hours. Coagulation Profile: Recent Labs  Lab 10/05/2020 1649  INR 2.0*   Urine analysis:    Component Value Date/Time   COLORURINE YELLOW (A) 10/24/2020 1649   APPEARANCEUR HAZY (A) 10/07/2020 1649   APPEARANCEUR Clear 10/10/2016 1911   LABSPEC 1.014 10/08/2020 1649   LABSPEC 1.031 06/01/2014 2258   PHURINE 5.0 10/14/2020 1649   GLUCOSEU NEGATIVE 10/28/2020 1649   GLUCOSEU 50 mg/dL 06/01/2014 2258   HGBUR NEGATIVE 10/12/2020 1649   BILIRUBINUR NEGATIVE 10/01/2020 1649   BILIRUBINUR Negative 10/10/2016 1911   BILIRUBINUR Negative 06/01/2014 2258   KETONESUR NEGATIVE 10/04/2020 1649   PROTEINUR NEGATIVE 10/07/2020 1649    UROBILINOGEN 1.0 06/07/2014 0952   NITRITE NEGATIVE 10/24/2020 1649   LEUKOCYTESUR NEGATIVE 10/06/2020 1649   LEUKOCYTESUR Negative 06/01/2014 2258   CRITICAL CARE Performed by: Briant Cedar Apolonio Cutting  Total critical care time: 35 minutes  Critical care time was exclusive of separately billable procedures and treating other patients.  Critical care was necessary to treat or prevent imminent or life-threatening deterioration. Sepsis  Critical care was time spent personally by me on the following activities: development of treatment plan with patient and/or surrogate as well as nursing, discussions with consultants, evaluation of patient's response to treatment, examination of patient, obtaining history from patient or surrogate, ordering and performing treatments and interventions, ordering and review of laboratory studies, ordering and review of radiographic studies, pulse oximetry and re-evaluation of patient's condition.  Dr. Tobie Poet [facility]  If 7PM-7AM, please contact overnight-coverage provider If 7AM-7PM, please contact day coverage provider [email or web address]  10/23/2020, 8:25 PM

## 2020-10-19 NOTE — Consult Note (Addendum)
Pharmacy Antibiotic Note  Melinda [name] is a 65 y.o. female admitted on 10/06/2020 with non-healing right heel wound.  Pharmacy has been consulted for vancomycin and cefepime dosing for wound infection.  Initial imaging: neg for osteomyelitis.   Plan: Initiate cefepime 2 gram Q8H Vancomycin 2000 mg LD x 1 given in ED Initiate Vancomycin 1250 mg Q24H maintenance dose. Goal AUC 400-550 Scr used: 1.13 Expected AUC: 503 Monitor renal function daily  Height: 5\' 8"  (172.7 cm) Weight: 110.7 kg (244 lb) IBW/kg (Calculated) : 63.9  Temp (24hrs), Avg:99.8 F (37.7 C), Min:99.8 F (37.7 C), Max:99.8 F (37.7 C)  Recent Labs  Lab 10/07/2020 1419 10/07/2020 1649  WBC 14.7*  --   CREATININE 1.13*  --   LATICACIDVEN 2.5* 2.0*    Estimated Creatinine Clearance: 63 mL/min (A) (by C-G formula based on SCr of 1.13 mg/dL (H)).    No Known Allergies  Antimicrobials this admission: 6/21 cefepime >>  6/21 vancomycin >>  6/21: flagyl >>  Dose adjustments this admission: N/a  Microbiology results: 6/21 BCx: sent    Thank you for allowing pharmacy to be a part of this patient's care.  Dorothe Pea, PharmD, BCPS Clinical Pharmacist   10/18/2020 8:06 PM

## 2020-10-19 NOTE — Sepsis Progress Note (Signed)
Notified bedside nurse of need to draw blood cultures and collect a repeat Lactic Acid.

## 2020-10-19 NOTE — ED Notes (Signed)
Attempted 22g IV at L ac.

## 2020-10-19 NOTE — ED Notes (Signed)
Podiatrist at bedside assessing pt's foot. Melinda [name] transporting pt as soon as provider done assessing pt's feet.

## 2020-10-19 NOTE — ED Notes (Addendum)
Assessed wound at R heel. Pt reports has had trouble with her R heel for years. When asked, confirms diabetic neuropathy. Pt very SOB when switching from wheelchair to stretcher. Pt reports that is atypical for her. Denies CP; denies history of COPD. Wound lightly oozing brown/yellow drainage; about 2.5 inches in length; almost entire heel.

## 2020-10-19 NOTE — ED Notes (Signed)
Pt resp currently reg/unlabored; skin dry; pt resting calmly in bed.

## 2020-10-19 NOTE — Progress Notes (Addendum)
Melinda, [name] ([id number]) Visit Report for 10/10/2020 Arrival Information Details Patient Name: Melinda [name], Melinda [name] Date of Service: 10/17/2020 12:45 PM Medical Record Number: [id number] Patient Account Number: [id number] Date of Birth/Sex: Aug 25, 1955 (65 y.o. F) Treating RN: Donnamarie Poag Primary Care Sacora Hawbaker: [name] Other Clinician: Referring Dafina Suk: [name] Treating Trequan Marsolek/Extender: Skipper Cliche in Treatment: 2 Visit Information History Since Last Visit Added or deleted any medications: No Patient Arrived: Wheel Chair Had a fall or experienced change in No Arrival Time: 13:01 activities of daily living that may affect Accompanied By: friend risk of falls: Transfer Assistance: EasyPivot Patient Lift Hospitalized since last visit: No Patient Identification Verified: Yes Has Dressing in Place as Prescribed: Yes Secondary Verification Process Completed: Yes Pain Present Now: Yes Patient Has Alerts: Yes Patient Alerts: Patient on Blood [place] Signature(s) Signed: 10/10/2020 3:40:04 PM By: Donnamarie Poag Entered By: Donnamarie Poag on 10/05/2020 13:02:07 Melinda [name] ([id number]) -------------------------------------------------------------------------------- Clinic Level of Care Assessment Details Patient Name: Melinda [name] Date of Service: 10/17/2020 12:45 PM Medical Record Number: [id number] Patient Account Number: [id number] Date of Birth/Sex: March 08, 1956 (65 y.o. F) Treating RN: Dolan Amen Primary Care Christyn Gutkowski: [name] Other Clinician: Referring Sevin Langenbach: [name] Treating Ukiah Trawick/Extender: Skipper Cliche in Treatment: 2 Clinic Level of Care Assessment Items TOOL 4 Quantity Score X - Use when only an EandM is performed on FOLLOW-UP visit 1 0 ASSESSMENTS - Nursing Assessment / Reassessment X - Reassessment of Co-morbidities (includes updates in patient status) 1 10 X- 1 5 Reassessment of Adherence to  Treatment Plan ASSESSMENTS - Wound and Skin Assessment / Reassessment []  - Simple Wound Assessment / Reassessment - one wound 0 X- 5 5 Complex Wound Assessment / Reassessment - multiple wounds []  - 0 Dermatologic / Skin Assessment (not related to wound area) ASSESSMENTS - Focused Assessment []  - Circumferential Edema Measurements - multi extremities 0 []  - 0 Nutritional Assessment / Counseling / Intervention []  - 0 Lower Extremity Assessment (monofilament, tuning fork, pulses) []  - 0 Peripheral Arterial Disease Assessment (using hand held doppler) ASSESSMENTS - Ostomy and/or Continence Assessment and Care []  - Incontinence Assessment and Management 0 []  - 0 Ostomy Care Assessment and Management (repouching, etc.) PROCESS - Coordination of Care X - Simple Patient / Family Education for ongoing care 1 15 []  - 0 Complex (extensive) Patient / Family Education for ongoing care []  - 0 Staff obtains Programmer, systems, Records, Test Results / Process Orders []  - 0 Staff telephones HHA, Nursing Homes / Clarify orders / etc []  - 0 Routine Transfer to another Facility (non-emergent condition) X- 1 10 Routine Hospital Admission (non-emergent condition) []  - 0 New Admissions / Biomedical engineer / Ordering NPWT, Apligraf, etc. []  - 0 Emergency Hospital Admission (emergent condition) X- 1 10 Simple Discharge Coordination []  - 0 Complex (extensive) Discharge Coordination PROCESS - Special Needs []  - Pediatric / Minor Patient Management 0 []  - 0 Isolation Patient Management []  - 0 Hearing / Language / Visual special needs []  - 0 Assessment of Community assistance (transportation, D/C planning, etc.) []  - 0 Additional assistance / Altered mentation []  - 0 Support Surface(s) Assessment (bed, cushion, seat, etc.) INTERVENTIONS - Wound Cleansing / Measurement Karpf, Menna E. ([id number]) []  - 0 Simple Wound Cleansing - one wound X- 5 5 Complex Wound Cleansing - multiple wounds X- 1  5 Wound Imaging (photographs - any number of wounds) []  - 0 Wound Tracing (instead of photographs) []  - 0 Simple Wound Measurement - one wound  X- 5 5 Complex Wound Measurement - multiple wounds INTERVENTIONS - Wound Dressings []  - Small Wound Dressing one or multiple wounds 0 X- 1 15 Medium Wound Dressing one or multiple wounds []  - 0 Large Wound Dressing one or multiple wounds []  - 0 Application of Medications - topical []  - 0 Application of Medications - injection INTERVENTIONS - Miscellaneous []  - External ear exam 0 []  - 0 Specimen Collection (cultures, biopsies, blood, body fluids, etc.) []  - 0 Specimen(s) / Culture(s) sent or taken to Lab for analysis []  - 0 Patient Transfer (multiple staff / Civil Service fast streamer / Similar devices) []  - 0 Simple Staple / Suture removal (25 or less) []  - 0 Complex Staple / Suture removal (26 or more) []  - 0 Hypo / Hyperglycemic Management (close monitor of Blood Glucose) []  - 0 Ankle / Brachial Index (ABI) - do not check if billed separately X- 1 5 Vital Signs Has the patient been seen at the hospital within the last three years: Yes Total Score: 150 Level Of Care: New/Established - Level 4 Electronic Signature(s) Signed: 10/27/2020 4:15:25 PM By: Georges Mouse, Minus Breeding RN Entered By: Georges Mouse, Kenia on 10/13/2020 13:52:50 Melinda [name] ([id number]) -------------------------------------------------------------------------------- Lower Extremity Assessment Details Patient Name: Melinda [name] Date of Service: 10/10/2020 12:45 PM Medical Record Number: [id number] Patient Account Number: [id number] Date of Birth/Sex: 02-15-1956 (65 y.o. F) Treating RN: Donnamarie Poag Primary Care Manjit Bufano: [name] Other Clinician: Referring Dijuan Sleeth: [name] Treating Nasteho Glantz/Extender: Skipper Cliche in Treatment: 2 Edema Assessment Assessed: [Left: Yes] [Right: Yes] [Left: Edema] [Right: :] Calf Left: Right: Point of Measurement:  From Medial Instep 43 cm Ankle Left: Right: Point of Measurement: From Medial Instep 25 cm Electronic Signature(s) Signed: 10/08/2020 3:40:04 PM By: Donnamarie Poag Entered By: Donnamarie Poag on 10/09/2020 13:25:02 Melinda [name] ([id number]) -------------------------------------------------------------------------------- Multi Wound Chart Details Patient Name: Melinda [name] Date of Service: 10/23/2020 12:45 PM Medical Record Number: [id number] Patient Account Number: [id number] Date of Birth/Sex: 1955-12-20 (65 y.o. F) Treating RN: Dolan Amen Primary Care Brealyn Baril: [name] Other Clinician: Referring Augusto Deckman: [name] Treating Aleira Deiter/Extender: Skipper Cliche in Treatment: 2 Vital Signs Height(in): 68 Pulse(bpm): 102 Weight(lbs): 244 Blood Pressure(mmHg): 108/72 Body Mass Index(BMI): 37 Temperature(F): 99.3 Respiratory Rate(breaths/min): 18 Photos: Wound Location: Right, Proximal, Posterior Lower Right, Lateral, Anterior Lower Leg Right, Lateral Calcaneus Leg Wounding Event: Gradually Appeared Gradually Appeared Gradually Appeared Primary Etiology: Venous Leg Ulcer Venous Leg Ulcer Pressure Ulcer Comorbid History: Cataracts, Arrhythmia, Coronary Cataracts, Arrhythmia, Coronary Cataracts, Arrhythmia, Coronary Artery Disease, Hypertension, Artery Disease, Hypertension, Artery Disease, Hypertension, Myocardial Infarction, Peripheral Myocardial Infarction, Peripheral Myocardial Infarction, Peripheral Venous Disease, Type II Diabetes, Venous Disease, Type II Diabetes, Venous Disease, Type II Diabetes, Osteoarthritis, Osteomyelitis, Osteoarthritis, Osteomyelitis, Osteoarthritis, Osteomyelitis, Neuropathy Neuropathy Neuropathy Date Acquired: 10/11/2020 08/29/2020 08/29/2020 Weeks of Treatment: 0 2 2 Wound Status: Open Open Open Clustered Wound: Yes Yes No Measurements L x W x D (cm) 3x3x0.1 5x5x0.1 3.5x2x1.2 Area (cm) : 7.069 19.635 5.498 Volume (cm) : 0.707 1.963  6.597 % Reduction in Area: 0.00% -127.30% -216.70% % Reduction in Volume: 0.00% -127.20% -1166.20% Starting Position 1 (o'clock): 12 Ending Position 1 (o'clock): 12 Maximum Distance 1 (cm): 0.4 Undermining: No No Yes Classification: Full Thickness Without Exposed Full Thickness Without Exposed Category/Stage III Support Structures Support Structures Exudate Amount: Medium Medium Large Exudate Type: Serosanguineous Serosanguineous Serosanguineous Exudate Color: red, brown red, brown red, brown Granulation Amount: Small (1-33%) Large (67-100%) None Present (0%) Granulation Quality: Red Red, Pink N/A  Necrotic Amount: Large (67-100%) Small (1-33%) Large (67-100%) Exposed Structures: Fat Layer (Subcutaneous Tissue): Fat Layer (Subcutaneous Tissue): Fat Layer (Subcutaneous Tissue): Yes Yes Yes Fascia: No Fascia: No Fascia: No Tendon: No Tendon: No Tendon: No Muscle: No Muscle: No Muscle: No Joint: No Joint: No Joint: No Bone: No Bone: No Bone: No Epithelialization: N/A N/A None Wound Number: 8 9 N/A Photos: N/A ROZLYNN, LIPPOLD ([id number]) Wound Location: Left, Plantar Metatarsal head first Right, Medial Lower Leg N/A Wounding Event: Gradually Appeared Gradually Appeared N/A Primary Etiology: Diabetic Wound/Ulcer of the Lower Venous Leg Ulcer N/A Extremity Comorbid History: Cataracts, Arrhythmia, Coronary Cataracts, Arrhythmia, Coronary N/A Artery Disease, Hypertension, Artery Disease, Hypertension, Myocardial Infarction, Peripheral Myocardial Infarction, Peripheral Venous Disease, Type II Diabetes, Venous Disease, Type II Diabetes, Osteoarthritis, Osteomyelitis, Osteoarthritis, Osteomyelitis, Neuropathy Neuropathy Date Acquired: 05/01/2017 10/05/2020 N/A Weeks of Treatment: 2 2 N/A Wound Status: Open Open N/A Clustered Wound: No No N/A Measurements L x W x D (cm) 0.6x0.6x0.2 0.5x0.4x0.1 N/A Area (cm) : 0.283 0.157 N/A Volume (cm) : 0.057 0.016 N/A % Reduction in Area:  -100.70% 97.50% N/A % Reduction in Volume: 0.00% 98.70% N/A Undermining: No No N/A Classification: Grade 2 Full Thickness Without Exposed N/A Support Structures Exudate Amount: Medium Medium N/A Exudate Type: Serosanguineous Serosanguineous N/A Exudate Color: red, brown red, brown N/A Granulation Amount: Large (67-100%) Small (1-33%) N/A Granulation Quality: Red Pink N/A Necrotic Amount: Small (1-33%) Large (67-100%) N/A Exposed Structures: Fat Layer (Subcutaneous Tissue): N/A N/A Yes Fascia: No Tendon: No Muscle: No Joint: No Bone: No Epithelialization: N/A N/A N/A Treatment Notes Electronic Signature(s) Signed: 10/24/2020 4:15:25 PM By: Georges Mouse, Minus Breeding RN Entered By: Georges Mouse, Minus Breeding on 10/24/2020 13:38:24 Melinda [name] ([id number]) -------------------------------------------------------------------------------- Multi-Disciplinary Care Plan Details Patient Name: Melinda [name] Date of Service: 10/28/2020 12:45 PM Medical Record Number: [id number] Patient Account Number: [id number] Date of Birth/Sex: May 22, 1955 (65 y.o. F) Treating RN: Dolan Amen Primary Care Yarlin Breisch: [name] Other Clinician: Referring Elim Economou: [name] Treating Tareva Leske/Extender: Skipper Cliche in Treatment: 2 Active Inactive Electronic Signature(s) Signed: 10/27/2020 11:49:49 AM By: [name], [name], RN, [name], [name] RN, [name] Signed: 10/29/2020 3:40:24 PM By: Dolan Amen RN Previous Signature: 10/08/2020 4:15:25 PM Version By: Georges Mouse, Minus Breeding RN Entered By: [name], [name], RN, [name], [name] on 10/27/2020 11:49:49 Melinda [name] ([id number]) -------------------------------------------------------------------------------- Pain Assessment Details Patient Name: KOYA, HUNGER. Date of Service: 10/24/2020 12:45 PM Medical Record Number: [id number] Patient Account Number: [id number] Date of Birth/Sex: Sep 07, 1955 (65 y.o. F) Treating RN: Donnamarie Poag Primary Care Lexus Shampine: [name]  Other Clinician: Referring Kylinn Shropshire: [name] Treating Dyllan Hughett/Extender: Skipper Cliche in Treatment: 2 Active Problems Location of Pain Severity and Description of Pain Patient Has Paino Yes Site Locations Pain Location: Pain in Ulcers Rate the pain. Current Pain Level: 3 Pain Management and Medication Current Pain Management: Electronic Signature(s) Signed: 10/09/2020 3:40:04 PM By: Donnamarie Poag Entered By: Donnamarie Poag on 10/20/2020 13:04:28 Melinda [name] ([id number]) -------------------------------------------------------------------------------- Patient/Caregiver Education Details Patient Name: Melinda [name] Date of Service: 10/23/2020 12:45 PM Medical Record Number: [id number] Patient Account Number: [id number] Date of Birth/Gender: 1956-02-14 (65 y.o. F) Treating RN: Dolan Amen Primary Care Physician: [name] Other Clinician: Referring Physician: [name] Treating Physician/Extender: Skipper Cliche in Treatment: 2 Education Assessment Education Provided To: Patient Education Topics Provided Wound/Skin Impairment: Methods: Explain/Verbal Responses: State content correctly Electronic Signature(s) Signed: 10/06/2020 4:15:25 PM By: Georges Mouse, Minus Breeding RN Entered By: Georges Mouse, Minus Breeding on 10/20/2020 13:53:09 Melinda [name] ([id number]) --------------------------------------------------------------------------------  Wound Assessment Details Patient Name: MARIHA, SLEEPER. Date of Service: 10/18/2020 12:45 PM Medical Record Number: [id number] Patient Account Number: [id number] Date of Birth/Sex: 1955/09/29 (65 y.o. F) Treating RN: Donnamarie Poag Primary Care Merrissa Giacobbe: [name] Other Clinician: Referring Pal Shell: [name] Treating Cyris Maalouf/Extender: Skipper Cliche in Treatment: 2 Wound Status Wound Number: 10 Primary Venous Leg Ulcer Etiology: Wound Location: Right, Proximal, Posterior Lower Leg Wound Open Wounding Event:  Gradually Appeared Status: Date Acquired: 10/16/2020 Comorbid Cataracts, Arrhythmia, Coronary Artery Disease, Weeks Of Treatment: 0 History: Hypertension, Myocardial Infarction, Peripheral Venous Clustered Wound: Yes Disease, Type II Diabetes, Osteoarthritis, Osteomyelitis, Neuropathy Photos Wound Measurements Length: (cm) 3 Width: (cm) 3 Depth: (cm) 0.1 Area: (cm) 7.069 Volume: (cm) 0.707 % Reduction in Area: 0% % Reduction in Volume: 0% Tunneling: No Undermining: No Wound Description Classification: Full Thickness Without Exposed Support Structures Exudate Amount: Medium Exudate Type: Serosanguineous Exudate Color: red, brown Foul Odor After Cleansing: No Slough/Fibrino Yes Wound Bed Granulation Amount: Small (1-33%) Exposed Structure Granulation Quality: Red Fascia Exposed: No Necrotic Amount: Large (67-100%) Fat Layer (Subcutaneous Tissue) Exposed: Yes Necrotic Quality: Adherent Slough Tendon Exposed: No Muscle Exposed: No Joint Exposed: No Bone Exposed: No Electronic Signature(s) Signed: 10/11/2020 3:40:04 PM By: Donnamarie Poag Entered By: Donnamarie Poag on 10/20/2020 13:23:31 Melinda [name] ([id number]) -------------------------------------------------------------------------------- Wound Assessment Details Patient Name: Melinda [name] Date of Service: 09/29/2020 12:45 PM Medical Record Number: [id number] Patient Account Number: [id number] Date of Birth/Sex: [birth year] (65 y.o. F) Treating RN: Donnamarie Poag Primary Care Xayne Brumbaugh: [name] Other Clinician: Referring Caliann Leckrone: [name] Treating Khristi Schiller/Extender: Skipper Cliche in Treatment: 2 Wound Status Wound Number: 6 Primary Venous Leg Ulcer Etiology: Wound Location: Right, Lateral, Anterior Lower Leg Wound Open Wounding Event: Gradually Appeared Status: Date Acquired: 08/29/2020 Comorbid Cataracts, Arrhythmia, Coronary Artery Disease, Weeks Of Treatment: 2 History: Hypertension, Myocardial  Infarction, Peripheral Venous Clustered Wound: Yes Disease, Type II Diabetes, Osteoarthritis, Osteomyelitis, Neuropathy Photos Wound Measurements Length: (cm) 5 Width: (cm) 5 Depth: (cm) 0.1 Area: (cm) 19.635 Volume: (cm) 1.963 % Reduction in Area: -127.3% % Reduction in Volume: -127.2% Tunneling: No Undermining: No Wound Description Classification: Full Thickness Without Exposed Support Structures Exudate Amount: Medium Exudate Type: Serosanguineous Exudate Color: red, brown Foul Odor After Cleansing: No Slough/Fibrino Yes Wound Bed Granulation Amount: Large (67-100%) Exposed Structure Granulation Quality: Red, Pink Fascia Exposed: No Necrotic Amount: Small (1-33%) Fat Layer (Subcutaneous Tissue) Exposed: Yes Necrotic Quality: Adherent Slough Tendon Exposed: No Muscle Exposed: No Joint Exposed: No Bone Exposed: No Electronic Signature(s) Signed: 10/01/2020 3:40:04 PM By: Donnamarie Poag Entered By: Donnamarie Poag on 10/20/2020 13:19:44 Melinda [name] ([id number]) -------------------------------------------------------------------------------- Wound Assessment Details Patient Name: Melinda [name] Date of Service: 10/27/2020 12:45 PM Medical Record Number: [id number] Patient Account Number: [id number] Date of Birth/Sex: May 18, 1955 (65 y.o. F) Treating RN: Donnamarie Poag Primary Care Rohit Deloria: [name] Other Clinician: Referring Viyan Rosamond: [name] Treating Yalonda Sample/Extender: Skipper Cliche in Treatment: 2 Wound Status Wound Number: 7 Primary Pressure Ulcer Etiology: Wound Location: Right, Lateral Calcaneus Wound Open Wounding Event: Gradually Appeared Status: Date Acquired: 08/29/2020 Comorbid Cataracts, Arrhythmia, Coronary Artery Disease, Weeks Of Treatment: 2 History: Hypertension, Myocardial Infarction, Peripheral Venous Clustered Wound: No Disease, Type II Diabetes, Osteoarthritis, Osteomyelitis, Neuropathy Photos Wound Measurements Length: (cm)  3.5 Width: (cm) 2 Depth: (cm) 1.2 Area: (cm) 5.498 Volume: (cm) 6.597 % Reduction in Area: -216.7% % Reduction in Volume: -1166.2% Epithelialization: None Tunneling: No Undermining: Yes Starting Position (o'clock): 12 Ending Position (o'clock): 12 Maximum Distance: (cm) 0.4  Wound Description Classification: Category/Stage III Exudate Amount: Large Exudate Type: Serosanguineous Exudate Color: red, brown Foul Odor After Cleansing: No Slough/Fibrino Yes Wound Bed Granulation Amount: None Present (0%) Exposed Structure Necrotic Amount: Large (67-100%) Fascia Exposed: No Necrotic Quality: Adherent Slough Fat Layer (Subcutaneous Tissue) Exposed: Yes Tendon Exposed: No Muscle Exposed: No Joint Exposed: No Bone Exposed: No Electronic Signature(s) Signed: 10/14/2020 3:40:04 PM By: Donnamarie Poag Entered By: Donnamarie Poag on 10/10/2020 13:24:17 Melinda [name] ([id number]) Davina Poke, Sonia Side ([id number]) -------------------------------------------------------------------------------- Wound Assessment Details Patient Name: Jinger Neighbors E. Date of Service: 10/12/2020 12:45 PM Medical Record Number: [id number] Patient Account Number: [id number] Date of Birth/Sex: 1955/08/18 (65 y.o. F) Treating RN: Donnamarie Poag Primary Care Rafaelita Foister: [name] Other Clinician: Referring Dalyn Becker: [name] Treating Adithi Gammon/Extender: Skipper Cliche in Treatment: 2 Wound Status Wound Number: 8 Primary Diabetic Wound/Ulcer of the Lower Extremity Etiology: Wound Location: Left, Plantar Metatarsal head first Wound Open Wounding Event: Gradually Appeared Status: Date Acquired: 05/01/2017 Comorbid Cataracts, Arrhythmia, Coronary Artery Disease, Weeks Of Treatment: 2 History: Hypertension, Myocardial Infarction, Peripheral Venous Clustered Wound: No Disease, Type II Diabetes, Osteoarthritis, Osteomyelitis, Neuropathy Photos Wound Measurements Length: (cm) 0.6 Width: (cm) 0.6 Depth: (cm)  0.2 Area: (cm) 0.283 Volume: (cm) 0.057 % Reduction in Area: -100.7% % Reduction in Volume: 0% Tunneling: No Undermining: No Wound Description Classification: Grade 2 Exudate Amount: Medium Exudate Type: Serosanguineous Exudate Color: red, brown Foul Odor After Cleansing: No Slough/Fibrino Yes Wound Bed Granulation Amount: Large (67-100%) Exposed Structure Granulation Quality: Red Fascia Exposed: No Necrotic Amount: Small (1-33%) Fat Layer (Subcutaneous Tissue) Exposed: Yes Necrotic Quality: Adherent Slough Tendon Exposed: No Muscle Exposed: No Joint Exposed: No Bone Exposed: No Electronic Signature(s) Signed: 10/20/2020 3:40:04 PM By: Donnamarie Poag Entered By: Donnamarie Poag on 10/08/2020 13:17:21 Melinda [name] ([id number]) -------------------------------------------------------------------------------- Wound Assessment Details Patient Name: Melinda [name] Date of Service: 10/04/2020 12:45 PM Medical Record Number: [id number] Patient Account Number: [id number] Date of Birth/Sex: [birth year] (65 y.o. F) Treating RN: Donnamarie Poag Primary Care Patrina Andreas: [name] Other Clinician: Referring Geoffrey Mankin: [name] Treating Jaquez Farrington/Extender: Skipper Cliche in Treatment: 2 Wound Status Wound Number: 9 Primary Venous Leg Ulcer Etiology: Wound Location: Right, Medial Lower Leg Wound Open Wounding Event: Gradually Appeared Status: Date Acquired: 10/05/2020 Comorbid Cataracts, Arrhythmia, Coronary Artery Disease, Weeks Of Treatment: 2 History: Hypertension, Myocardial Infarction, Peripheral Venous Clustered Wound: No Disease, Type II Diabetes, Osteoarthritis, Osteomyelitis, Neuropathy Photos Wound Measurements Length: (cm) 0.5 Width: (cm) 0.4 Depth: (cm) 0.1 Area: (cm) 0.157 Volume: (cm) 0.016 % Reduction in Area: 97.5% % Reduction in Volume: 98.7% Tunneling: No Undermining: No Wound Description Classification: Full Thickness Without Exposed Support  Structures Exudate Amount: Medium Exudate Type: Serosanguineous Exudate Color: red, brown Foul Odor After Cleansing: No Slough/Fibrino Yes Wound Bed Granulation Amount: Small (1-33%) Granulation Quality: Pink Necrotic Amount: Large (67-100%) Necrotic Quality: Adherent Slough Electronic Signature(s) Signed: 10/20/2020 3:40:04 PM By: Donnamarie Poag Entered By: Donnamarie Poag on 10/03/2020 13:21:24 Melinda [name] ([id number]) -------------------------------------------------------------------------------- Vitals Details Patient Name: Melinda [name] Date of Service: 10/16/2020 12:45 PM Medical Record Number: [id number] Patient Account Number: [id number] Date of Birth/Sex: 1956-03-01 (65 y.o. F) Treating RN: Donnamarie Poag Primary Care Tanieka Pownall: [name] Other Clinician: Referring Lizzette Carbonell: [name] Treating Chrishelle Zito/Extender: Skipper Cliche in Treatment: 2 Vital Signs Time Taken: 13:04 Temperature (F): 99.3 Height (in): 68 Pulse (bpm): 102 Weight (lbs): 244 Respiratory Rate (breaths/min): 18 Body Mass Index (BMI): 37.1 Blood Pressure (mmHg): 108/72 Reference Range: 80 - 120 mg / dl Electronic  Signature(s) Signed: 10/03/2020 3:40:04 PM By: Donnamarie Poag Entered ByDonnamarie Poag on 10/13/2020 13:04:16

## 2020-10-19 NOTE — ED Provider Notes (Signed)
[facility] Emergency Department Provider Note ____________________________________________   Event Date/Time   First MD Initiated Contact with Patient 10/03/2020 1528     (approximate)  I have reviewed the triage vital signs and the nursing notes.   HISTORY  Chief Complaint Wound Infection  HPI Melinda [name] is a 65 y.o. female with history of IDDM , PVD, and additional as listed below presents to the emergency department for treatment and evaluation of wound to the right heel that has been present for the past month or so. She was sent to the ER by the wound care center for further workup and IV antibiotics.  No fever that she is aware of.       Past Medical History:  Diagnosis Date   Anxiety    Atrial fibrillation (HCC)    Atrial flutter ([place])    Coronary artery disease    a. 06/2014 NSTEMI s/p CABG x 4 (LIMA to LAD, VG to Diag, VG to OM, VG to PDA).   Depression    Diabetic neuropathy (HCC)    GERD (gastroesophageal reflux disease)    HTN (hypertension)    Hyperlipidemia LDL goal <70    IDDM (insulin dependent diabetes mellitus)    Ischemic cardiomyopathy    a. 06/2014 Echo: EF 40-45%, HK of entire inferolateral and inferior myocardium c/w infarct of RCA/LCx, GR2DD, mild MR   Myocardial infarction ([place]) 2016   OA (osteoarthritis) of knee    Obesity    Osteomyelitis ([place])    Peripheral neuropathy    Peripheral vascular disease ([place])    a. 09/2016 Periph Angio: CTO R popliteal, CTO L prox/mid SFA->Med Rx; b. 05/2017 PTA: LSFA (Viabahn covered stent x 2), DEB to L Post Tibial; c. 06/2017 ABI: R 0.44, L 1.05.   Tobacco abuse     Patient Active Problem List   Diagnosis Date Noted   Wound infection 10/11/2020   Recurrent falls 09/02/2020   Unspecified protein-calorie malnutrition ([place]) 08/26/2020   Wound of left foot 08/26/2020   AF (paroxysmal atrial fibrillation) ([place])    Fall 08/22/2020   Atrial fibrillation with RVR ([place]) 08/22/2020    CKD (chronic kidney disease), stage IIIa 08/22/2020   Depression 58/30/9407   Chronic systolic CHF (congestive heart failure) ([place]) 08/22/2020   Iron deficiency anemia 08/22/2020   Atrial flutter with rapid ventricular response (HCC)    Acute respiratory failure with hypoxia ([place]) 08/11/2020   S/P gastric bypass 02/09/2020   Severe recurrent major depression without psychotic features ([place]) 03/12/2018   Cocaine abuse ([place]) 03/12/2018   Suicidal ideation 03/12/2018   Atherosclerosis of native arteries of the extremities with ulceration ([place]) 06/19/2017   Gangrene of toe of left foot ([place]) 05/14/2017   CAD (coronary artery disease), native coronary artery 05/05/2017   Claudication in peripheral vascular disease ([place]) 10/18/2016   Hyperlipidemia LDL goal <70 10/18/2016   Onychomycosis 10/10/2016   Encounters for administrative purpose 10/10/2016   Amputated toe, left ([place]) 10/10/2016   Amputated toe, right ([place]) 10/10/2016   Osteomyelitis of left foot ([place]) 10/21/2015   Hyperglycemia 10/02/2015   Gastroesophageal reflux disease 11/16/2014   Generalized ischemic myocardial dysfunction 07/07/2014   Peripheral neuropathy    Tobacco abuse    Obesity    S/P CABG x 4 06/12/2014   Chronic coronary artery disease 06/08/2014   Uncontrolled secondary diabetes mellitus with stage 3 CKD (GFR 30-59) (HCC)    NSTEMI (non-ST elevated myocardial infarction) ([place]) 06/03/2014   Essential hypertension 09/11/2012  Diabetes mellitus type 2, insulin dependent ([place]) 09/11/2012    Past Surgical History:  Procedure Laterality Date   ABDOMINAL AORTOGRAM W/LOWER EXTREMITY N/A 10/27/2016   Procedure: Abdominal Aortogram w/Lower Extremity;  Surgeon: Nelva Bush, MD;  Location: [facility];  Service: Cardiovascular;  Laterality: N/A;   AMPUTATION TOE Left 10/21/2015   Procedure: AMPUTATION TOE;  Surgeon: Sharlotte Alamo, DPM;  Location: [facility];  Service: Podiatry;  Laterality: Left;   AMPUTATION TOE  Left 05/15/2017   Procedure: AMPUTATION LEFT GREAT TOE;  Surgeon: Sharlotte Alamo, DPM;  Location: [facility];  Service: Podiatry;  Laterality: Left;   AORTIC VALVE REPLACEMENT (AVR)/CORONARY ARTERY BYPASS GRAFTING (CABG)     BREAST BIOPSY     CARDIAC CATHETERIZATION  06/2014   95% stenosis mLAD, occlusion ostial OM1, 70% stenosis LCx, 95% stenosis mRCA, EF 45%.   CARDIOVERSION N/A 04/14/2020   Procedure: CARDIOVERSION with TEE;  Surgeon: Minna Merritts, MD;  Location: [facility];  Service: Cardiovascular;  Laterality: N/A;   CARDIOVERSION N/A 05/28/2020   Procedure: CARDIOVERSION;  Surgeon: Minna Merritts, MD;  Location: [facility];  Service: Cardiovascular;  Laterality: N/A;   CORONARY ARTERY BYPASS GRAFT N/A 06/08/2014   Procedure: CORONARY ARTERY BYPASS GRAFTING (CABG);  Surgeon: Grace Isaac, MD;  Location: [facility];  Service: Open Heart Surgery;  Laterality: N/A;  Times 4 using left internal mammary artery to LAD artery and endoscopically harvested bilateral saphenous vein to Obtuse Marginal, Diagonal and Posterior Descending coronary arteries.   CT ABD W & PELVIS WO CM  06/2014   nl liver, gallbladder, spleen, mild diverticular changes, no bowel wall inflammation, appendix nl, no hernia, no other sig abnormalities   GASTRIC ROUX-EN-Y N/A 02/09/2020   Procedure: LAPAROSCOPIC ROUX-EN-Y GASTRIC BYPASS WITH UPPER ENDOSCOPY;  Surgeon: Johnathan Hausen, MD;  Location: [facility];  Service: General;  Laterality: N/A;   LOWER EXTREMITY ANGIOGRAPHY Left 05/23/2017   Procedure: LOWER EXTREMITY ANGIOGRAPHY;  Surgeon: Algernon Huxley, MD;  Location: [facility];  Service: Cardiovascular;  Laterality: Left;   LOWER EXTREMITY ANGIOGRAPHY Left 05/28/2017   Procedure: LOWER EXTREMITY ANGIOGRAPHY;  Surgeon: Algernon Huxley, MD;  Location: [facility];  Service: Cardiovascular;  Laterality: Left;   LOWER EXTREMITY ANGIOGRAPHY Left 12/16/2018   Procedure: LOWER EXTREMITY ANGIOGRAPHY;  Surgeon: Algernon Huxley,  MD;  Location: [facility];  Service: Cardiovascular;  Laterality: Left;   LOWER EXTREMITY ANGIOGRAPHY Right 12/23/2018   Procedure: LOWER EXTREMITY ANGIOGRAPHY;  Surgeon: Algernon Huxley, MD;  Location: [facility];  Service: Cardiovascular;  Laterality: Right;   TEE WITHOUT CARDIOVERSION N/A 06/08/2014   Procedure: TRANSESOPHAGEAL ECHOCARDIOGRAM (TEE);  Surgeon: Grace Isaac, MD;  Location: [facility];  Service: Open Heart Surgery;  Laterality: N/A;   TEE WITHOUT CARDIOVERSION N/A 04/14/2020   Procedure: TRANSESOPHAGEAL ECHOCARDIOGRAM (TEE);  Surgeon: Minna Merritts, MD;  Location: [facility];  Service: Cardiovascular;  Laterality: N/A;   TOE AMPUTATION Right 12/2011   rt middle toe   UPPER GI ENDOSCOPY N/A 02/09/2020   Procedure: UPPER GI ENDOSCOPY;  Surgeon: Johnathan Hausen, MD;  Location: [facility];  Service: General;  Laterality: N/A;   US ECHOCARDIOGRAPHY  06/2014   EF 50-55%, HK of inf/post/inferolat walls, Ao sclerosis    Prior to Admission medications   Medication Sig Start Date End Date Taking? Authorizing Provider  ACCU-CHEK AVIVA PLUS test strip 1 each 3 (three) times daily. Patient not taking: Reported on 08/27/2020 12/31/19   [provider]  Accu-Chek Softclix Lancets lancets 1 each 3 (three) times daily. Patient not taking: Reported on 08/27/2020 01/13/20   [provider]  Alcohol Swabs (B-D SINGLE USE SWABS REGULAR) PADS Apply topically. Patient not taking: Reported on 08/27/2020 01/21/20   [provider]  amiodarone (PACERONE) 200 MG tablet Take 1 tablet (200 mg total) by mouth 2 (two) times daily. 09/03/20   Hendricks Limes, MD  apixaban (ELIQUIS) 5 MG TABS tablet Take 1 tablet (5 mg total) by mouth 2 (two) times daily. 10/06/2020   Vickie Epley, MD  atorvastatin (LIPITOR) 80 MG tablet Take 1 tablet (80 mg total) by mouth daily at 6 PM. 09/03/20   Hendricks Limes, MD  Cholecalciferol (VITAMIN D3) 50 MCG (2000 UT) TABS Take 2,000 Units by  mouth daily.    [provider]  clotrimazole (LOTRIMIN) 1 % cream Apply  as directed to affected area twice a day  for fungal infection 06/14/20   [provider]  diltiazem (CARDIZEM) 60 MG tablet Take 1 tablet (60 mg total) by mouth 2 (two) times daily. 09/03/20   Hendricks Limes, MD  docusate sodium (COLACE) 100 MG capsule Take 100 mg by mouth 2 (two) times daily.    [provider]  DROPLET PEN NEEDLES 31G X 6 MM [place]  11/10/19   [provider]  DULoxetine (CYMBALTA) 60 MG capsule Take 1 capsule (60 mg total) by mouth daily. 09/03/20   Hendricks Limes, MD  Emollient (CERAVE EX) Apply 1 application topically in the morning and at bedtime.    [provider]  furosemide (LASIX) 20 MG tablet Take 2 tablets (40 mg total) by mouth daily as needed (fluid retention.). 09/03/20   Hendricks Limes, MD  insulin aspart (NOVOLOG) 100 UNIT/ML injection Inject 4 Units into the skin 3 (three) times daily with meals. 08/25/20   Loletha Grayer, MD  insulin glargine (LANTUS) 100 UNIT/ML injection Inject 0.06 mLs (6 Units total) into the skin at bedtime. 09/03/20   Hendricks Limes, MD  lidocaine (LIDODERM) 5 % Place 1 patch onto the skin daily as needed (PAIN.).  02/24/19   [provider]  metoprolol tartrate (LOPRESSOR) 50 MG tablet TAKE 1 TABLET TWICE DAILY 09/24/20   Vickie Epley, MD  Multiple Vitamins-Minerals (MULTIVITAMIN WITH MINERALS) tablet Take 1 tablet by mouth in the morning and at bedtime.    [provider]  NON FORMULARY Med Pass Give 121ml po BID    [provider]  oxybutynin (DITROPAN) 5 MG tablet Take 5 mg by mouth 2 (two) times daily.  05/10/17   [provider]  pantoprazole (PROTONIX) 40 MG tablet Take 1 tablet (40 mg total) by mouth daily. 09/03/20   Hendricks Limes, MD  potassium chloride SA (KLOR-CON) 20 MEQ tablet TAKE 1 TABLET (20 MEQ TOTAL) BY MOUTH DAILY AS NEEDED. 09/03/20   Hendricks Limes, MD   pregabalin (LYRICA) 100 MG capsule Take 1 capsule (100 mg total) by mouth 3 (three) times daily. 09/08/20   Medina-Vargas, Monina C, NP  tiZANidine (ZANAFLEX) 4 MG capsule Take 1 capsule (4 mg total) by mouth 3 (three) times daily as needed for muscle spasms. 09/06/20   Medina-Vargas, Monina C, NP  varenicline (CHANTIX) 1 MG tablet Take 1 mg by mouth daily. Patient not taking: Reported on 09/02/2020 07/17/20   [provider]    Allergies Patient has no known allergies.  Family History  Problem Relation Age of  Onset   CAD Mother    Hypertension Mother    Diabetes Mother    CAD Father    Diabetes Father    Sickle cell trait Other    Asthma Other     Social History Social History   Tobacco Use   Smoking status: Former    Packs/day: 0.50    Years: 40.00    Pack years: 20.00    Types: Cigarettes   Smokeless tobacco: Never   Tobacco comments:    started Chantix 04/12/20  Vaping Use   Vaping Use: Never used  Substance Use Topics   Alcohol use: No    Alcohol/week: 0.0 standard drinks   Drug use: Not Currently    Comment: crack    Review of Systems  Constitutional: No fever/chills Eyes: No visual changes. ENT: No sore throat. Cardiovascular: Denies chest pain. Respiratory: Denies shortness of breath. Gastrointestinal: No abdominal pain.  No nausea, no vomiting.  No diarrhea.  No constipation. Genitourinary: Negative for dysuria. Musculoskeletal: Positive for right lower extremity pain.. Skin: Negative for rash. Neurological: Negative for headaches, focal weakness or numbness.  ____________________________________________   PHYSICAL EXAM:  VITAL SIGNS: ED Triage Vitals  Enc Vitals Group     BP 10/23/2020 1418 127/76     Pulse Rate 10/13/2020 1418 100     Resp 10/23/2020 1418 20     Temp 10/12/2020 1418 99.8 F (37.7 C)     Temp Source 10/20/2020 1418 Oral     SpO2 10/02/2020 1418 98 %     Weight 10/06/2020 1416 244 lb (110.7 kg)     Height 10/28/2020 1416 5\' 8"   (1.727 m)     Head Circumference --      Peak Flow --      Pain Score 10/04/2020 1416 8     Pain Loc --      Pain Edu? --      Excl. in [place]? --     Constitutional: Alert and oriented.  Chronically ill appearing and in no acute distress. Eyes: Conjunctivae are normal Head: Atraumatic. Nose: No congestion/rhinnorhea. Mouth/Throat: Mucous membranes are moist.   Neck: No stridor.   Hematological/Lymphatic/Immunilogical: No petechia or excessive bruising noted on exposed skin Cardiovascular: Normal rate, regular rhythm. Grossly normal heart sounds.  Good peripheral circulation. Respiratory: Normal respiratory effort.  No retractions. Lungs CTAB. Gastrointestinal: Soft and nontender. No distention.  Genitourinary:  Musculoskeletal: Limited range of motion of the right lower extremity secondary to chronic wound Neurologic:  Normal speech and language. No gross focal neurologic deficits are appreciated.  Skin:     Psychiatric: Mood and affect are normal. Speech and behavior are normal.  ____________________________________________   LABS (all labs ordered are listed, but only abnormal results are displayed)  Labs Reviewed  LACTIC ACID, PLASMA - Abnormal; Notable for the following components:      Result Value   Lactic Acid, Venous 2.5 (*)    All other components within normal limits  LACTIC ACID, PLASMA - Abnormal; Notable for the following components:   Lactic Acid, Venous 2.0 (*)    All other components within normal limits  COMPREHENSIVE METABOLIC PANEL - Abnormal; Notable for the following components:   Potassium 2.5 (*)    Glucose, Bld 128 (*)    Creatinine, Ser 1.13 (*)    Calcium 7.7 (*)    Total Protein 6.1 (*)    Albumin 2.1 (*)    Alkaline Phosphatase 140 (*)    Total Bilirubin 1.5 (*)  GFR, Estimated 53 (*)    All other components within normal limits  CBC WITH DIFFERENTIAL/PLATELET - Abnormal; Notable for the following components:   WBC 14.7 (*)    RBC 3.86 (*)     Hemoglobin 10.0 (*)    HCT 29.8 (*)    MCV 77.2 (*)    MCH 25.9 (*)    RDW 20.3 (*)    Neutro Abs 12.0 (*)    Monocytes Absolute 1.1 (*)    All other components within normal limits  PROTIME-INR - Abnormal; Notable for the following components:   Prothrombin Time 23.0 (*)    INR 2.0 (*)    All other components within normal limits  APTT - Abnormal; Notable for the following components:   aPTT 48 (*)    All other components within normal limits  RESP PANEL BY RT-PCR (FLU A&B, COVID) ARPGX2  CULTURE, BLOOD (ROUTINE X 2)  CULTURE, BLOOD (ROUTINE X 2)  URINALYSIS, COMPLETE (UACMP) WITH MICROSCOPIC   ____________________________________________  EKG  ED ECG REPORT I, Brennden Masten, FNP-BC personally viewed and interpreted this ECG.   Date: 10/24/2020  EKG Time: 1753  Rate: 105  Rhythm: sinus tachycardia  Axis: normal  Intervals:nonspecific intraventricular conduction delay  ST&T Change: no ST elevated.  ____________________________________________  RADIOLOGY  ED MD interpretation:    Image of the right calcaneus negative for evidence of osteomyelitis.  I, Sherrie George, personally viewed and evaluated these images (plain radiographs) as part of my medical decision making, as well as reviewing the written report by the radiologist.  Official radiology report(s): DG Os Calcis Right  Result Date: 10/05/2020 CLINICAL DATA:  Open wound heel EXAM: RIGHT OS CALCIS - 2+ VIEW COMPARISON:  02/18/2019 FINDINGS: Negative for fracture or osteomyelitis. Mild calcaneal spurring.  Mild arterial calcification. IMPRESSION: No acute skeletal abnormality. Electronically Signed   By: Franchot Gallo M.D.   On: 10/03/2020 16:24    ____________________________________________   PROCEDURES  Procedure(s) performed (including Critical Care):  Procedures  ____________________________________________   INITIAL IMPRESSION / ASSESSMENT AND PLAN     65 year old female presenting to  the emergency department for treatment and evaluation of right foot wound.  She has been evaluated at the wound care center and sent to the emergency department for IV antibiotics and admission.  DIFFERENTIAL DIAGNOSIS  Sepsis, cellulitis, osteomyelitis  ED COURSE  Lactate elevated to 2.5.  She is tachycardic with a respiratory rate of 20.  Temp is 99.8.  Although stable at this time, septic secondary to wound infection.  Sepsis protocol initiated without 30 mL/kg fluid as there is no current endorgan involvement.  ----------------------------------------- 6:09 PM on 10/23/2020 ----------------------------------------- Patient accepted for admission.  COVID and influenza swab has been collected but is still pending.  CRITICAL CARE Performed by: Sherrie George   Total critical care time: 45 minutes  Critical care time was exclusive of separately billable procedures and treating other patients.  Critical care was necessary to treat or prevent imminent or life-threatening deterioration.  Critical care was time spent personally by me on the following activities: development of treatment plan with patient and/or surrogate as well as nursing, discussions with consultants, evaluation of patient's response to treatment, examination of patient, obtaining history from patient or surrogate, ordering and performing treatments and interventions, ordering and review of laboratory studies, ordering and review of radiographic studies, pulse oximetry and re-evaluation of patient's condition.    ___________________________________________   FINAL CLINICAL IMPRESSION(S) / ED DIAGNOSES  Final diagnoses:  Wound infection  Sepsis without acute organ dysfunction, due to unspecified organism [facility])     ED Discharge Orders     None        Sherle Mello was evaluated in Emergency Department on 10/28/2020 for the symptoms described in the history of present illness. She was evaluated in the  context of the global COVID-19 pandemic, which necessitated consideration that the patient might be at risk for infection with the SARS-CoV-2 virus that causes COVID-19. Institutional protocols and algorithms that pertain to the evaluation of patients at risk for COVID-19 are in a state of rapid change based on information released by regulatory bodies including the CDC and federal and state organizations. These policies and algorithms were followed during the patient's care in the ED.   Note:  This document was prepared using Dragon voice recognition software and may include unintentional dictation errors.    Victorino Dike, FNP 10/03/2020 1810    Delman Kitten, MD 10/20/2020 2221

## 2020-10-19 NOTE — ED Notes (Signed)
New non-adhesive dressing applied to pt's R heel.

## 2020-10-19 NOTE — ED Notes (Signed)
Attempted 22g IV at L fa.

## 2020-10-19 NOTE — ED Notes (Signed)
Phlebotomy states successfully collected 2nd set of cultures.

## 2020-10-19 NOTE — ED Notes (Signed)
Provider Cox A at bedside.

## 2020-10-19 NOTE — ED Notes (Signed)
Georgie RN aware of assigned bed 

## 2020-10-19 NOTE — Telephone Encounter (Signed)
29f, 113.9kg, scr 1.24 10/07/20, lovw/lambert 06/30/20

## 2020-10-19 NOTE — Consult Note (Signed)
PHARMACY -  BRIEF ANTIBIOTIC NOTE   Pharmacy has received consult(s) for Vancomycin and Cefepime from an ED provider.  The patient's profile has been reviewed for ht/wt/allergies/indication/available labs.    One time order(s) placed for Vancomycin 2g IV and Cefepime 2g IV x 1 dose each.  Further antibiotics/pharmacy consults should be ordered by admitting physician if indicated.                       Thank you, Pearla Dubonnet 10/18/2020  3:54 PM

## 2020-10-19 NOTE — Progress Notes (Addendum)
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 hospital and was not doing nearly as well she is much weaker at this point. With that being said again she does have the foot ulcer. She also has evidence again of wounds on the right lower extremity and again I do believe she has chronic venous stasis here she is on fluid pill but nonetheless is still having a lot of fluid leakage  which I think is leading to some of the wounds that were experiencing seeing at this point. She is seen with her brother in the office today. This is the first time of actually seeing him with her. 10/12/2020 upon evaluation today patient's wounds appear to be doing slightly better in regard to the left foot and the right lower extremities. With that being said the heel on the right has a lot of necrotic tissue that is going require some sharp debridement today. This is definitely the worst of all of her wounds. Overall I do not see any signs of active infection at this time. No fevers, chills, nausea, vomiting, or diarrhea. 10/24/2020 upon evaluation today patient appears to be doing poorly in regard to her right heel. Unfortunately the patient appears to have evidence of infection of the right heel which to be honest seems to be worsening quite significantly in my opinion. She is also been experiencing issues with chills although there is no  fever she did have at this point a temperature of 99.3 which again is on the higher side of normal obviously. Fortunately there does not appear to be any signs of obvious sepsis but nonetheless I am concerned about how the heel has digressed from last week to this week. LAVAUGHN, BISIG EMarland Kitchen ([id number]) Objective Constitutional Obese and well-hydrated in no acute distress. Vitals Time Taken: 1:04 PM, Height: 68 in, Weight: 244 lbs, BMI: 37.1, Temperature: 99.3 F, Pulse: 102 bpm, Respiratory Rate: 18 breaths/min, Blood Pressure: 108/72 mmHg. Respiratory normal breathing without difficulty. Psychiatric this patient is able to make decisions and demonstrates good insight into disease process. Alert and Oriented x 3. pleasant and cooperative. General Notes: Patient's wound does have signs of an evidence of infection in the right heel which is quite significant. I am concerned about gas buildup due to the fact that if I push further up on the heel it actually caused  bubbling and pus to come out from the wound opening. I feel like that this is a fairly substantial wound potential abscess which is much more significant than what was even noted last week. That is my main concern currently I think she needs to go to the ER for further evaluation and treatment of this to be honest. She also did have some injury due to the wrap around the anterior portion of the ankle. Her wrap slid down. She tells me that it began to hurt and bother her she tried to do what she could wrap it back up but nonetheless with the sliding down the integrity was obviously compromised and this did constrict around the ankle area. That caused a little bit of deep tissue injury but I am hopeful this will resolve. Integumentary (Hair, Skin) Wound #10 status is Open. Original cause of wound was Gradually Appeared. The date acquired was: 10/04/2020. The wound is located on the Right,Proximal,Posterior Lower Leg. The wound measures 3cm length x 3cm width x 0.1cm depth; 7.069cm^2 area and 0.707cm^3 volume. There is Fat Layer (Subcutaneous Tissue) exposed. There is no tunneling or undermining noted. There is a medium amount of serosanguineous drainage noted. There is small (1-33%) red granulation within the wound bed. There is a large (67-100%) amount of necrotic tissue within the wound bed including Adherent Slough. Wound #6 status is Open. Original cause of wound was Gradually Appeared. The date acquired was: 08/29/2020. The wound has been in treatment 2 weeks. The wound is located on the Right,Lateral,Anterior Lower Leg. The wound measures 5cm length x 5cm width x 0.1cm depth; 19.635cm^2 area and 1.963cm^3 volume. There is Fat Layer (Subcutaneous Tissue) exposed. There is no tunneling or undermining noted. There is a medium amount of serosanguineous drainage noted. There is large (67-100%) red, pink granulation within the wound bed. There is a small (1-33%) amount of necrotic tissue within the wound  bed including Adherent Slough. Wound #7 status is Open. Original cause of wound was Gradually Appeared. The date acquired was: 08/29/2020. The wound has been in treatment 2 weeks. The wound is located on the Right,Lateral Calcaneus. The wound measures 3.5cm length x 2cm width x 1.2cm depth; 5.498cm^2 area and 6.597cm^3 volume. There is Fat Layer (Subcutaneous Tissue) exposed. There is no tunneling noted, however, there is undermining starting at 12:00 and ending at 12:00 with a maximum distance of 0.4cm. There is a large amount of serosanguineous drainage noted. There is no granulation within the wound bed. There is a large (67-100%) amount of necrotic tissue within the wound  bed including Adherent Slough. Wound #8 status is Open. Original cause of wound was Gradually Appeared. The date acquired was: 05/01/2017. The wound has been in treatment 2 weeks. The wound is located on the Left,Plantar Metatarsal head first. The wound measures 0.6cm length x 0.6cm width x 0.2cm depth; 0.283cm^2 area and 0.057cm^3 volume. There is Fat Layer (Subcutaneous Tissue) exposed. There is no tunneling or undermining noted. There is a medium amount of serosanguineous drainage noted. There is large (67-100%) red granulation within the wound bed. There is a small (1-33%) amount of necrotic tissue within the wound bed including Adherent Slough. Wound #9 status is Open. Original cause of wound was Gradually Appeared. The date acquired was: 10/05/2020. The wound has been in treatment 2 weeks. The wound is located on the Right,Medial Lower Leg. The wound measures 0.5cm length x 0.4cm width x 0.1cm depth; 0.157cm^2 area and 0.016cm^3 volume. There is no tunneling or undermining noted. There is a medium amount of serosanguineous drainage noted. There is small (1-33%) pink granulation within the wound bed. There is a large (67-100%) amount of necrotic tissue within the wound bed including Adherent Slough. Assessment Active  Problems ICD-10 Type 2 diabetes mellitus with foot ulcer Non-pressure chronic ulcer of other part of left foot with fat layer exposed Venous insufficiency (chronic) (peripheral) Pressure ulcer of right heel, unstageable Non-pressure chronic ulcer of other part of right lower leg with fat layer exposed Other specified peripheral vascular diseases Paroxysmal atrial fibrillation Chronic combined systolic (congestive) and diastolic (congestive) heart failure Armas, Danijela E. ([id number]) Cardiomyopathy in diseases classified elsewhere Long term (current) use of anticoagulants Corns and callosities Plan Follow-up Appointments: Other: - Patient sent to ED for further evaluation and treatment General Notes: Patient sent to ED for further evaluation and treatment WOUND #10: - Lower Leg Wound Laterality: Right, Posterior, Proximal Secondary Dressing: ABD Pad 5x9 (in/in) Discharge Instructions: Cover with ABD pad Secondary Dressing: Kerlix 4.5 x 4.1 (in/yd) Discharge Instructions: Apply Kerlix 4.5 x 4.1 (in/yd) as instructed WOUND #6: - Lower Leg Wound Laterality: Right, Lateral, Anterior Secondary Dressing: ABD Pad 5x9 (in/in) Discharge Instructions: Cover with ABD pad Secondary Dressing: Kerlix 4.5 x 4.1 (in/yd) Discharge Instructions: Apply Kerlix 4.5 x 4.1 (in/yd) as instructed WOUND #7: - Calcaneus Wound Laterality: Right, Lateral Secondary Dressing: ABD Pad 5x9 (in/in) Discharge Instructions: Cover with ABD pad Secondary Dressing: Kerlix 4.5 x 4.1 (in/yd) Discharge Instructions: Apply Kerlix 4.5 x 4.1 (in/yd) as instructed WOUND #8: - Metatarsal head first Wound Laterality: Plantar, Left Primary Dressing: Silvercel Small 2x2 (in/in) Discharge Instructions: Apply Silvercel Small 2x2 (in/in) as instructed Secondary Dressing: ABD Pad 5x9 (in/in) Discharge Instructions: Cover with ABD pad Secured With: 17M Medipore H Soft Cloth Surgical Tape, 2x2 (in/yd) Discharge Instructions:  Secure conform Secured With: Conforming Stretch Gauze Bandage 4x75 (in/in) Discharge Instructions: Apply as directed WOUND #9: - Lower Leg Wound Laterality: Right, Medial Secondary Dressing: ABD Pad 5x9 (in/in) Discharge Instructions: Cover with ABD pad Secondary Dressing: Kerlix 4.5 x 4.1 (in/yd) Discharge Instructions: Apply Kerlix 4.5 x 4.1 (in/yd) as instructed 1. Would recommend currently that we go ahead and have the patient go to the ER for further evaluation and treatment. The heel does not appear to be doing well I think she may need IV antibiotics. She also may require debridement of this area more substantially than what I can do here in the clinic. 2. I am also can recommend at this time that we have the patient wrapped up with ABD and roll  gauze to secure in place to get her over to the ER. We will get a call to [facility] in order to have them And take her over to the ER for further evaluation and treatment. My main concern here is I feel like that the patient may have an abscess in regard to her right heel she has Pain noted and again this has dramatically declined since last week's evaluation. In fact when I push more proximal on the heel and posterior it actually caused bubbling pus to come out of the wound opening itself this is my main concern I feel like this infection is deep-seated. We will see patient back for reevaluation in 1 week here in the clinic. If anything worsens or changes patient will contact our office for additional recommendations. Electronic Signature(s) Signed: 10/17/2020 1:54:42 PM By: [name] [name] Entered By: [name] on 10/11/2020 13:54:42 Melinda [name] ([id number]) -------------------------------------------------------------------------------- SuperBill Details Patient Name: Melinda [name] Date of Service: 10/27/2020 Medical Record Number: [id number] Patient Account Number: [id number] Date of Birth/Sex: 1955/05/19 (65 y.o.  F) Treating RN: Dolan Amen Primary Care Provider: [name] Other Clinician: Referring Provider: [name] Treating Provider/Extender: Skipper Cliche in Treatment: 2 Diagnosis Coding ICD-10 Codes Code Description E11.621 Type 2 diabetes mellitus with foot ulcer 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sed I87.2 Venous insufficiency (chronic) (peripheral) L89.610 Pressure ulcer of right heel, unstageable L97.812 Non-pressure chronic ulcer of other part of right lower leg with fat layer exposed I73.89 Other specified peripheral vascular diseases I48.0 Paroxysmal atrial fibrillation I50.42 Chronic combined systolic (congestive) and diastolic (congestive) heart failure I43 Cardiomyopathy in diseases classified elsewhere Z79.01 Long term (current) use of anticoagulants L84 Corns and callosities Facility Procedures CPT4 Code: [id number] Description: 99214 - WOUND CARE VISIT-LEV 4 EST PT Modifier: Quantity: 1 Physician Procedures CPT4 Code: [id number] Description: 99214 - WC PHYS LEVEL 4 - EST PT Modifier: Quantity: 1 CPT4 Code: Description: ICD-10 Diagnosis Description E11.621 Type 2 diabetes mellitus with foot ulcer L97.522 Non-pressure chronic ulcer of other part of left foot with fat layer ex I87.2 Venous insufficiency (chronic) (peripheral) L89.610 Pressure ulcer of right  heel, unstageable Modifier: posed Quantity: Electronic Signature(s) Signed: 10/15/2020 1:55:06 PM By: [name] [name] Previous Signature: 10/10/2020 1:52:59 PM Version By: Georges Mouse, Minus Breeding RN Entered By: [name] on 10/12/2020 13:55:05

## 2020-10-19 NOTE — ED Notes (Signed)
Purewick removed as pt being transported to floor. Pt educated of removal of purewick.

## 2020-10-19 NOTE — Progress Notes (Signed)
CODE SEPSIS - PHARMACY COMMUNICATION  **Broad Spectrum Antibiotics should be administered within 1 hour of Sepsis diagnosis**  Time Code Sepsis Called/Page Received: 1552  Antibiotics Ordered: Rocephin/Vancomycin  Time of 1st antibiotic administration: [place] Maykayla Highley ,PharmD Clinical Pharmacist  10/17/2020  5:41 PM

## 2020-10-20 ENCOUNTER — Inpatient Hospital Stay: Payer: Medicare HMO

## 2020-10-20 ENCOUNTER — Encounter: Payer: Self-pay | Admitting: Cardiovascular Disease

## 2020-10-20 ENCOUNTER — Ambulatory Visit: Payer: Medicare HMO | Admitting: Cardiology

## 2020-10-20 ENCOUNTER — Other Ambulatory Visit (INDEPENDENT_AMBULATORY_CARE_PROVIDER_SITE_OTHER): Payer: Self-pay | Admitting: Vascular Surgery

## 2020-10-20 DIAGNOSIS — E1165 Type 2 diabetes mellitus with hyperglycemia: Secondary | ICD-10-CM | POA: Diagnosis present

## 2020-10-20 DIAGNOSIS — A419 Sepsis, unspecified organism: Secondary | ICD-10-CM | POA: Diagnosis present

## 2020-10-20 DIAGNOSIS — I4892 Unspecified atrial flutter: Secondary | ICD-10-CM | POA: Diagnosis not present

## 2020-10-20 DIAGNOSIS — I471 Supraventricular tachycardia: Secondary | ICD-10-CM | POA: Diagnosis not present

## 2020-10-20 DIAGNOSIS — E1152 Type 2 diabetes mellitus with diabetic peripheral angiopathy with gangrene: Secondary | ICD-10-CM | POA: Diagnosis present

## 2020-10-20 DIAGNOSIS — I5020 Unspecified systolic (congestive) heart failure: Secondary | ICD-10-CM

## 2020-10-20 DIAGNOSIS — I5023 Acute on chronic systolic (congestive) heart failure: Secondary | ICD-10-CM | POA: Diagnosis present

## 2020-10-20 DIAGNOSIS — J9601 Acute respiratory failure with hypoxia: Secondary | ICD-10-CM | POA: Diagnosis not present

## 2020-10-20 DIAGNOSIS — I70261 Atherosclerosis of native arteries of extremities with gangrene, right leg: Secondary | ICD-10-CM | POA: Diagnosis present

## 2020-10-20 DIAGNOSIS — N1832 Chronic kidney disease, stage 3b: Secondary | ICD-10-CM | POA: Diagnosis present

## 2020-10-20 DIAGNOSIS — I130 Hypertensive heart and chronic kidney disease with heart failure and stage 1 through stage 4 chronic kidney disease, or unspecified chronic kidney disease: Secondary | ICD-10-CM | POA: Diagnosis present

## 2020-10-20 DIAGNOSIS — R579 Shock, unspecified: Secondary | ICD-10-CM | POA: Diagnosis not present

## 2020-10-20 DIAGNOSIS — R6520 Severe sepsis without septic shock: Secondary | ICD-10-CM | POA: Diagnosis not present

## 2020-10-20 DIAGNOSIS — J810 Acute pulmonary edema: Secondary | ICD-10-CM | POA: Diagnosis not present

## 2020-10-20 DIAGNOSIS — I472 Ventricular tachycardia: Secondary | ICD-10-CM | POA: Diagnosis not present

## 2020-10-20 DIAGNOSIS — I96 Gangrene, not elsewhere classified: Secondary | ICD-10-CM | POA: Diagnosis not present

## 2020-10-20 DIAGNOSIS — Z66 Do not resuscitate: Secondary | ICD-10-CM | POA: Diagnosis not present

## 2020-10-20 DIAGNOSIS — E1122 Type 2 diabetes mellitus with diabetic chronic kidney disease: Secondary | ICD-10-CM | POA: Diagnosis present

## 2020-10-20 DIAGNOSIS — Z20822 Contact with and (suspected) exposure to covid-19: Secondary | ICD-10-CM | POA: Diagnosis present

## 2020-10-20 DIAGNOSIS — M86171 Other acute osteomyelitis, right ankle and foot: Secondary | ICD-10-CM | POA: Diagnosis not present

## 2020-10-20 DIAGNOSIS — E11621 Type 2 diabetes mellitus with foot ulcer: Secondary | ICD-10-CM | POA: Diagnosis present

## 2020-10-20 DIAGNOSIS — E1142 Type 2 diabetes mellitus with diabetic polyneuropathy: Secondary | ICD-10-CM | POA: Diagnosis present

## 2020-10-20 DIAGNOSIS — G928 Other toxic encephalopathy: Secondary | ICD-10-CM | POA: Diagnosis not present

## 2020-10-20 DIAGNOSIS — E1169 Type 2 diabetes mellitus with other specified complication: Secondary | ICD-10-CM | POA: Diagnosis present

## 2020-10-20 DIAGNOSIS — F32A Depression, unspecified: Secondary | ICD-10-CM | POA: Diagnosis present

## 2020-10-20 DIAGNOSIS — I480 Paroxysmal atrial fibrillation: Secondary | ICD-10-CM | POA: Diagnosis not present

## 2020-10-20 DIAGNOSIS — J9602 Acute respiratory failure with hypercapnia: Secondary | ICD-10-CM | POA: Diagnosis not present

## 2020-10-20 DIAGNOSIS — I4891 Unspecified atrial fibrillation: Secondary | ICD-10-CM | POA: Diagnosis not present

## 2020-10-20 DIAGNOSIS — R6521 Severe sepsis with septic shock: Secondary | ICD-10-CM | POA: Diagnosis not present

## 2020-10-20 DIAGNOSIS — I5021 Acute systolic (congestive) heart failure: Secondary | ICD-10-CM | POA: Diagnosis not present

## 2020-10-20 DIAGNOSIS — I739 Peripheral vascular disease, unspecified: Secondary | ICD-10-CM | POA: Diagnosis not present

## 2020-10-20 DIAGNOSIS — N170 Acute kidney failure with tubular necrosis: Secondary | ICD-10-CM | POA: Diagnosis not present

## 2020-10-20 DIAGNOSIS — Z515 Encounter for palliative care: Secondary | ICD-10-CM | POA: Diagnosis not present

## 2020-10-20 DIAGNOSIS — I70234 Atherosclerosis of native arteries of right leg with ulceration of heel and midfoot: Secondary | ICD-10-CM | POA: Diagnosis not present

## 2020-10-20 DIAGNOSIS — T148XXA Other injury of unspecified body region, initial encounter: Secondary | ICD-10-CM | POA: Diagnosis present

## 2020-10-20 DIAGNOSIS — R570 Cardiogenic shock: Secondary | ICD-10-CM | POA: Diagnosis not present

## 2020-10-20 DIAGNOSIS — I2510 Atherosclerotic heart disease of native coronary artery without angina pectoris: Secondary | ICD-10-CM | POA: Diagnosis not present

## 2020-10-20 DIAGNOSIS — Z95820 Peripheral vascular angioplasty status with implants and grafts: Secondary | ICD-10-CM | POA: Diagnosis not present

## 2020-10-20 DIAGNOSIS — L089 Local infection of the skin and subcutaneous tissue, unspecified: Secondary | ICD-10-CM | POA: Diagnosis not present

## 2020-10-20 DIAGNOSIS — L97419 Non-pressure chronic ulcer of right heel and midfoot with unspecified severity: Secondary | ICD-10-CM | POA: Diagnosis not present

## 2020-10-20 DIAGNOSIS — I469 Cardiac arrest, cause unspecified: Secondary | ICD-10-CM | POA: Diagnosis not present

## 2020-10-20 DIAGNOSIS — I462 Cardiac arrest due to underlying cardiac condition: Secondary | ICD-10-CM | POA: Diagnosis not present

## 2020-10-20 DIAGNOSIS — L97412 Non-pressure chronic ulcer of right heel and midfoot with fat layer exposed: Secondary | ICD-10-CM | POA: Diagnosis not present

## 2020-10-20 DIAGNOSIS — N189 Chronic kidney disease, unspecified: Secondary | ICD-10-CM | POA: Diagnosis not present

## 2020-10-20 LAB — GLUCOSE, CAPILLARY
Glucose-Capillary: 149 mg/dL — ABNORMAL HIGH (ref 70–99)
Glucose-Capillary: 141 mg/dL — ABNORMAL HIGH (ref 70–99)
Glucose-Capillary: 193 mg/dL — ABNORMAL HIGH (ref 70–99)
Glucose-Capillary: 147 mg/dL — ABNORMAL HIGH (ref 70–99)

## 2020-10-20 LAB — BASIC METABOLIC PANEL
Anion gap: 8 (ref 5–15)
BUN: 12 mg/dL (ref 8–23)
CO2: 31 mmol/L (ref 22–32)
Calcium: 7.9 mg/dL — ABNORMAL LOW (ref 8.9–10.3)
Chloride: 100 mmol/L (ref 98–111)
Creatinine, Ser: 1.1 mg/dL — ABNORMAL HIGH (ref 0.44–1.00)
GFR, Estimated: 55 mL/min — ABNORMAL LOW (ref >60)
Glucose, Bld: 158 mg/dL — ABNORMAL HIGH (ref 70–99)
Potassium: 3.2 mmol/L — ABNORMAL LOW (ref 3.5–5.1)
Sodium: 139 mmol/L (ref 135–145)

## 2020-10-20 LAB — CBC
HCT: 29 % — ABNORMAL LOW (ref 36.0–46.0)
Hemoglobin: 9.7 g/dL — ABNORMAL LOW (ref 12.0–15.0)
MCH: 25.7 pg — ABNORMAL LOW (ref 26.0–34.0)
MCHC: 33.4 g/dL (ref 30.0–36.0)
MCV: 76.7 fL — ABNORMAL LOW (ref 80.0–100.0)
Platelets: 342 10*3/uL (ref 150–400)
RBC: 3.78 MIL/uL — ABNORMAL LOW (ref 3.87–5.11)
RDW: 20.1 % — ABNORMAL HIGH (ref 11.5–15.5)
WBC: 15 10*3/uL — ABNORMAL HIGH (ref 4.0–10.5)
nRBC: 0.1 % (ref 0.0–0.2)

## 2020-10-20 LAB — LACTIC ACID, PLASMA: Lactic Acid, Venous: 1.7 mmol/L (ref 0.5–1.9)

## 2020-10-20 LAB — HEPARIN LEVEL (UNFRACTIONATED): Heparin Unfractionated: >1.1 IU/mL — ABNORMAL HIGH (ref 0.30–0.70)

## 2020-10-20 LAB — PROTIME-INR
INR: 1.9 — ABNORMAL HIGH (ref 0.8–1.2)
Prothrombin Time: 21.6 seconds — ABNORMAL HIGH (ref 11.4–15.2)

## 2020-10-20 LAB — MAGNESIUM: Magnesium: 1.4 mg/dL — ABNORMAL LOW (ref 1.7–2.4)

## 2020-10-20 LAB — PROCALCITONIN: Procalcitonin: <0.1 ng/mL

## 2020-10-20 LAB — HEMOGLOBIN A1C
Hgb A1c MFr Bld: 7.5 % — ABNORMAL HIGH (ref 4.8–5.6)
Mean Plasma Glucose: 168.55 mg/dL

## 2020-10-20 LAB — APTT: aPTT: 45 seconds — ABNORMAL HIGH (ref 24–36)

## 2020-10-20 MED ORDER — MAGNESIUM SULFATE 4 GM/100ML IV SOLN
4.0000 g | Freq: Once | INTRAVENOUS | Status: AC
  Administered 2020-10-20: 4 g via INTRAVENOUS
  Filled 2020-10-20: qty 100

## 2020-10-20 MED ORDER — METRONIDAZOLE 500 MG/100ML IV SOLN
500.0000 mg | Freq: Three times a day (TID) | INTRAVENOUS | Status: AC
  Administered 2020-10-20 – 2020-10-21 (×3): 500 mg via INTRAVENOUS
  Filled 2020-10-20 (×7): qty 100

## 2020-10-20 MED ORDER — HEPARIN (PORCINE) 25000 UT/250ML-% IV SOLN
1700.0000 [IU]/h | INTRAVENOUS | Status: DC
  Administered 2020-10-20: 1100 [IU]/h via INTRAVENOUS
  Administered 2020-10-21: 1400 [IU]/h via INTRAVENOUS
  Filled 2020-10-20 (×2): qty 250

## 2020-10-20 MED ORDER — APIXABAN 5 MG PO TABS: 5.0000 mg | ORAL_TABLET | Freq: Two times a day (BID) | ORAL | Status: DC

## 2020-10-20 MED ORDER — POTASSIUM CHLORIDE CRYS ER 20 MEQ PO TBCR
80.0000 meq | EXTENDED_RELEASE_TABLET | Freq: Once | ORAL | Status: AC
  Administered 2020-10-20: 80 meq via ORAL
  Filled 2020-10-20: qty 4

## 2020-10-20 MED ORDER — HEPARIN BOLUS VIA INFUSION
4000.0000 [IU] | Freq: Once | INTRAVENOUS | Status: AC
  Administered 2020-10-20: 4000 [IU] via INTRAVENOUS
  Filled 2020-10-20: qty 4000

## 2020-10-20 MED ORDER — SODIUM CHLORIDE 0.9 % IV SOLN: INTRAVENOUS | Status: DC

## 2020-10-20 MED ORDER — FUROSEMIDE 10 MG/ML IJ SOLN
40.0000 mg | Freq: Every day | INTRAMUSCULAR | Status: DC
  Administered 2020-10-20 – 2020-10-21 (×2): 40 mg via INTRAVENOUS
  Filled 2020-10-20 (×2): qty 4

## 2020-10-20 MED ORDER — HEPARIN BOLUS VIA INFUSION
2500.0000 [IU] | Freq: Once | INTRAVENOUS | Status: AC
  Administered 2020-10-20: 2500 [IU] via INTRAVENOUS
  Filled 2020-10-20: qty 2500

## 2020-10-20 NOTE — Progress Notes (Addendum)
PROGRESS NOTE    Melinda [name]  ZOX:[id number] DOB: [birth year] DOA: 10/10/2020 PCP: Donnie Coffin, MD  Outpatient Specialists: cardiology, vascular surgery    Brief Narrative:   Melinda [name] is a 65 y.o. female with medical history significant for CKD 3B, insulin-dependent diabetes mellitus, anxiety, depression, hypertension, osteoarthritis, peripheral artery disease, sickle cell trait, tobacco dependency, status post left first toe amputation, right toe amputation, who presents to the emergency department for chief concerns of right heel wound.   She states that the wound started from skin peeling and she pulled it off about 3.5-4 weeks ago.   She follows with wound consult for left 1st toe wound, weekly. She was told by her wound care that she needs to come into the ED for evaluation of the right heel wound. She denies fever. She endorses nausea and vomiting x5 in the last 3 weeks.  She denies diet changes.   Social history: lives by herself. She smokes 1 cigarettes every 2 or 3 days. She denies etoh and recreational drug use. She is retired and formerly worked as a Programmer, applications   She endorses chronic shortness of breath that is unchanged over the last 5 years.   Assessment & Plan:   Principal Problem:   Sepsis ([place]) Active Problems:   Essential hypertension   S/P CABG x 4   Obesity   Hyperglycemia   Amputated toe, left (HCC)   Amputated toe, right (HCC)   S/P gastric bypass   CKD (chronic kidney disease), stage IIIa   Depression   AF (paroxysmal atrial fibrillation) ([place])   Wound infection   # Diabetic foot infection # osteomyelitis right heel # Sepsis Mild sepsis, hemodynamically stable. Mri shows calcaneal osteo. Hx severe PAD - continue vanc and cefepime, add flagyl - podiatry following - vascular surgery consulted - f/u blood cultures - NPO for now  # HFrEF # Hypoxia Patient says breathing is at baseline. Here o2 87 on room air. EF 30-35 in  April of this year. Cxr w/ b/l pulmonary edema. Followed by Dr. Arvid Right.  - [place] O2 - start lasix 40 iv qd - strict I/os - continue metop  # Hypokalemia 2.5 on admission. Received 40 meq - place on tele - stat repeat labs this morning with mg  # HTN BP wnl - cont home amodipine, metoprolol  # A fib/flutter Rate controlled. Followed by cardiology and EP. Outpatient for ablation procedure - cont home amio, metop, diltiazem - hold eliquis, start heparin gtt while surgical plans are made  # CAD # PAD S/p 4 vessel cabg and multiple vascular interventions - cont home statin, apixaban. Not on aspirin at home  # T2DM - SSI and home lantus; hold home ozempic  # CKD3 At baseline   DVT prophylaxis: heparin gtt Code Status: full Family Communication: none @ bedside  Level of care: Med-Surg Status is: Observation  The patient will require care spanning > 2 midnights and should be moved to inpatient because: Inpatient level of care appropriate due to severity of illness  Dispo: The patient is from: Home              Anticipated d/c is to:  tbd              Patient currently is not medically stable to d/c.   Difficult to place patient No        Consultants:  Podiatry, vascular surgery  Procedures: none  Antimicrobials:  Vanc/cefepime 6/21 Vanc/cevepime/flagyl  6/22>    Subjective: This morning no pain or fever, no chest pain, denies sob or cough.   Objective: Vitals:   10/17/2020 2030 10/12/2020 2106 10/20/20 0639 10/20/20 0809  BP: 124/73 137/81 136/85 131/77  Pulse: (!) 104 (!) 102 (!) 101 (!) 102  Resp:  20 18 18   Temp:  99.8 F (37.7 C) 98 F (36.7 C) 98.1 F (36.7 C)  TempSrc:      SpO2:  96% 90% 100%  Weight:      Height:        Intake/Output Summary (Last 24 hours) at 10/20/2020 0907 Last data filed at 10/20/2020 0327 Gross per 24 hour  Intake 1249.53 ml  Output --  Net 1249.53 ml   Filed Weights   10/08/2020 1416  Weight: 110.7 kg     Examination:  General exam: Appears calm and comfortable  Respiratory system: few rales @ bases Cardiovascular system: S1 & S2 heard, RRR. No JVD, murmurs, rubs, gallops or clicks.  Gastrointestinal system: Abdomen is nondistended, soft and nontender. No organomegaly or masses felt. Normal bowel sounds heard. Central nervous system: Alert and oriented. No focal neurological deficits. Extremities: Symmetric 5 x 5 power. Right foot is bandaged. Toes are cool Skin: bandaged right heel Psychiatry: Judgement and insight appear normal. Mood & affect appropriate.     Data Reviewed: I have personally reviewed following labs and imaging studies  CBC: Recent Labs  Lab 10/12/2020 1419  WBC 14.7*  NEUTROABS 12.0*  HGB 10.0*  HCT 29.8*  MCV 77.2*  PLT 259   Basic Metabolic Panel: Recent Labs  Lab 10/24/2020 1419  NA 138  K 2.5*  CL 98  CO2 31  GLUCOSE 128*  BUN 11  CREATININE 1.13*  CALCIUM 7.7*   GFR: Estimated Creatinine Clearance: 63 mL/min (A) (by C-G formula based on SCr of 1.13 mg/dL (H)). Liver Function Tests: Recent Labs  Lab 10/11/2020 1419  AST 19  ALT 17  ALKPHOS 140*  BILITOT 1.5*  PROT 6.1*  ALBUMIN 2.1*   No results for input(s): LIPASE, AMYLASE in the last 168 hours. No results for input(s): AMMONIA in the last 168 hours. Coagulation Profile: Recent Labs  Lab 10/04/2020 1649  INR 2.0*   Cardiac Enzymes: No results for input(s): CKTOTAL, CKMB, CKMBINDEX, TROPONINI in the last 168 hours. BNP (last 3 results) No results for input(s): PROBNP in the last 8760 hours. HbA1C: No results for input(s): HGBA1C in the last 72 hours. CBG: Recent Labs  Lab 10/27/2020 2149 10/20/20 0901  GLUCAP 147* 149*   Lipid Profile: No results for input(s): CHOL, HDL, LDLCALC, TRIG, CHOLHDL, LDLDIRECT in the last 72 hours. Thyroid Function Tests: No results for input(s): TSH, T4TOTAL, FREET4, T3FREE, THYROIDAB in the last 72 hours. Anemia Panel: No results for  input(s): VITAMINB12, FOLATE, FERRITIN, TIBC, IRON, RETICCTPCT in the last 72 hours. Urine analysis:    Component Value Date/Time   COLORURINE YELLOW (A) 10/06/2020 1649   APPEARANCEUR HAZY (A) 09/30/2020 1649   APPEARANCEUR Clear 10/10/2016 1911   LABSPEC 1.014 10/15/2020 1649   LABSPEC 1.031 06/01/2014 2258   PHURINE 5.0 10/01/2020 1649   GLUCOSEU NEGATIVE 10/09/2020 1649   GLUCOSEU 50 mg/dL 06/01/2014 2258   HGBUR NEGATIVE 10/10/2020 1649   BILIRUBINUR NEGATIVE 10/04/2020 1649   BILIRUBINUR Negative 10/10/2016 1911   BILIRUBINUR Negative 06/01/2014 2258   KETONESUR NEGATIVE 10/13/2020 1649   PROTEINUR NEGATIVE 10/10/2020 1649   UROBILINOGEN 1.0 06/07/2014 0952   NITRITE NEGATIVE 10/08/2020 1649   LEUKOCYTESUR  NEGATIVE 09/30/2020 1649   LEUKOCYTESUR Negative 06/01/2014 2258   Sepsis Labs: @LABRCNTIP (procalcitonin:4,lacticidven:4)  ) Recent Results (from the past 240 hour(s))  Resp Panel by RT-PCR (Flu A&B, Covid) Nasopharyngeal Swab     Status: None   Collection Time: 10/12/2020  4:49 PM   Specimen: Nasopharyngeal Swab; Nasopharyngeal(NP) swabs in vial transport medium  Result Value Ref Range Status   SARS Coronavirus 2 by RT PCR NEGATIVE NEGATIVE Final    Comment: (NOTE) SARS-CoV-2 target nucleic acids are NOT DETECTED.  The SARS-CoV-2 RNA is generally detectable in upper respiratory specimens during the acute phase of infection. The lowest concentration of SARS-CoV-2 viral copies this assay can detect is 138 copies/mL. A negative result does not preclude SARS-Cov-2 infection and should not be used as the sole basis for treatment or other patient management decisions. A negative result may occur with  improper specimen collection/handling, submission of specimen other than nasopharyngeal swab, presence of viral mutation(s) within the areas targeted by this assay, and inadequate number of viral copies(<138 copies/mL). A negative result must be combined with clinical  observations, patient history, and epidemiological information. The expected result is Negative.  Fact Sheet for Patients:  EntrepreneurPulse.com.au  Fact Sheet for Healthcare Providers:  IncredibleEmployment.be  This test is no t yet approved or cleared by the [place] FDA and  has been authorized for detection and/or diagnosis of SARS-CoV-2 by FDA under an Emergency Use Authorization (EUA). This EUA will remain  in effect (meaning this test can be used) for the duration of the COVID-19 declaration under Section 564(b)(1) of the Act, 21 U.S.C.section 360bbb-3(b)(1), unless the authorization is terminated  or revoked sooner.       Influenza A by PCR NEGATIVE NEGATIVE Final   Influenza B by PCR NEGATIVE NEGATIVE Final    Comment: (NOTE) The Xpert Xpress SARS-CoV-2/FLU/RSV plus assay is intended as an aid in the diagnosis of influenza from Nasopharyngeal swab specimens and should not be used as a sole basis for treatment. Nasal washings and aspirates are unacceptable for Xpert Xpress SARS-CoV-2/FLU/RSV testing.  Fact Sheet for Patients: EntrepreneurPulse.com.au  Fact Sheet for Healthcare Providers: IncredibleEmployment.be  This test is not yet approved or cleared by the [place] FDA and has been authorized for detection and/or diagnosis of SARS-CoV-2 by FDA under an Emergency Use Authorization (EUA). This EUA will remain in effect (meaning this test can be used) for the duration of the COVID-19 declaration under Section 564(b)(1) of the Act, 21 U.S.C. section 360bbb-3(b)(1), unless the authorization is terminated or revoked.  Performed at [facility], [address] [place], [place] [address]   Blood Culture (routine x 2)     Status: None (Preliminary result)   Collection Time: 10/15/2020  4:49 PM   Specimen: BLOOD  Result Value Ref Range Status   Specimen Description BLOOD RIGHT  ANTECUBITAL  Final   Special Requests   Final    BOTTLES DRAWN AEROBIC AND ANAEROBIC Blood Culture adequate volume   Culture   Final    NO GROWTH < 24 HOURS Performed at [facility], [address], [place], [place] [address]    Report Status PENDING  Incomplete  Blood Culture (routine x 2)     Status: None (Preliminary result)   Collection Time: 10/17/2020  5:15 PM   Specimen: BLOOD  Result Value Ref Range Status   Specimen Description BLOOD LEFT ANTECUBITAL  Final   Special Requests   Final    BOTTLES DRAWN AEROBIC AND ANAEROBIC Blood  Culture adequate volume   Culture   Final    NO GROWTH < 24 HOURS Performed at [facility], [address] [place], [place] [address]    Report Status PENDING  Incomplete         Radiology Studies: DG Os Calcis Right  Result Date: 10/02/2020 CLINICAL DATA:  Open wound heel EXAM: RIGHT OS CALCIS - 2+ VIEW COMPARISON:  02/18/2019 FINDINGS: Negative for fracture or osteomyelitis. Mild calcaneal spurring.  Mild arterial calcification. IMPRESSION: No acute skeletal abnormality. Electronically Signed   By: Franchot Gallo M.D.   On: 10/15/2020 16:24   MR ANKLE RIGHT WO CONTRAST  Result Date: 10/20/2020 CLINICAL DATA:  Osteomyelitis suspected, ankle, xray done Necrotic heel ulcer. Suspect osteomyelitis. EXAM: MRI OF THE RIGHT ANKLE WITHOUT CONTRAST TECHNIQUE: Multiplanar, multisequence MR imaging of the ankle was performed. No intravenous contrast was administered. COMPARISON:  Radiograph 10/24/2020 FINDINGS: TENDONS Peroneal: There is focal mild tenosynovitis of the peroneal longus and brevis tendons just below the lateral malleolus. There is intramuscular edema proximally. Posteromedial: Posterior tibial tendon intact. Flexor digitorum longus tendon intact. Flexor hallucis longus tendon intact. Anterior: Tibialis anterior tendon intact. Extensor hallucis longus tendon intact Extensor digitorum longus tendon intact. Achilles:  Intact.  Plantar Fascia: Intact. LIGAMENTS Lateral: Anterior talofibular ligament intact. Calcaneofibular ligament intact. Posterior talofibular ligament intact. Anterior and posterior tibiofibular ligaments intact. Medial: Deltoid ligament intact. Spring ligament intact. CARTILAGE Ankle Joint: No joint effusion. Normal ankle mortise. No chondral defect. There is mild midfoot arthritis. Subtalar Joints/Sinus Tarsi: Normal subtalar joints. No subtalar joint effusion. Normal sinus tarsi. Bones: There is bony edema in some associated low T1 signal along the lateral inferior calcaneus in likely cortical disruption. This is underlying a soft tissue ulcer with surrounding edema and subcutaneous gas. Soft Tissue: Lateral hindfoot ulcer as above. There is no well-defined soft tissue abscess. Extensive intramuscular and subcutaneous edema in the foot. IMPRESSION: Lateral hindfoot ulcer with underlying osteomyelitis of the inferolateral calcaneus, subcutaneous soft tissue gas and edema. No no well-defined soft tissue abscess. Focal mild tenosynovitis of the peroneal longus and brevis tendons just below the lateral malleolus. Electronically Signed   By: Maurine Simmering   On: 10/20/2020 07:19        Scheduled Meds:  amiodarone  200 mg Oral BID   amLODipine  5 mg Oral Daily   atorvastatin  80 mg Oral QHS   cholecalciferol  2,000 Units Oral Daily   DULoxetine  60 mg Oral Daily   insulin aspart  0-15 Units Subcutaneous TID WC   insulin aspart  0-5 Units Subcutaneous QHS   insulin glargine  6 Units Subcutaneous QHS   metoprolol tartrate  50 mg Oral BID   oxybutynin  5 mg Oral BID   pantoprazole  40 mg Oral Daily   pregabalin  100 mg Oral TID   Continuous Infusions:  ceFEPime (MAXIPIME) IV 2 g (10/20/20 0841)   lactated ringers 150 mL/hr at 10/20/20 0501   vancomycin       LOS: 0 days    Time spent: 40 min    Desma Maxim, MD [facility]   If 7PM-7AM, please contact  night-coverage [email or web address] Password [facility] 10/20/2020, 9:07 AM

## 2020-10-20 NOTE — Consult Note (Signed)
[facility] Vascular Consult Note  MRN : [id number]  Melinda [name] is a 65 y.o. ([birth year]) female who presents with chief complaint of  Chief Complaint  Patient presents with   Wound Infection   History of Present Illness:  Melinda [name] is a 65 year old female with medical history significant for CKD 3B, insulin-dependent diabetes mellitus, anxiety, depression, hypertension, osteoarthritis, peripheral artery disease, sickle cell trait, tobacco dependency, status post left first toe amputation, right toe amputation, who presents to the emergency department for chief concerns of right heel wound.   She states that the wound started from skin peeling and she pulled it off about 3.5-4 weeks ago.   She follows with wound consult for left 1st toe wound, weekly. She was told by her wound care that she needs to come into the ED for evaluation of the right heel wound. She denies fever. She endorses nausea and vomiting x5 in the last 3 weeks.  She denies diet changes.  Vascular surgery was consulted by Dr. Cathleen Fears for possible endovascular intervention.   Current Facility-Administered Medications  Medication Dose Route Frequency Provider Last Rate Last Admin   acetaminophen (TYLENOL) tablet 650 mg  650 mg Oral Q6H PRN Cox, Amy N, DO   650 mg at 10/20/20 4098   Or   acetaminophen (TYLENOL) suppository 650 mg  650 mg Rectal Q6H PRN Cox, Amy N, DO       amiodarone (PACERONE) tablet 200 mg  200 mg Oral BID Cox, Amy N, DO   200 mg at 10/20/20 0846   amLODipine (NORVASC) tablet 5 mg  5 mg Oral Daily Cox, Amy N, DO   5 mg at 10/20/20 0846   atorvastatin (LIPITOR) tablet 80 mg  80 mg Oral QHS Cox, Amy N, DO   80 mg at 10/08/2020 2127   ceFEPIme (MAXIPIME) 2 g in sodium chloride 0.9 % 100 mL IVPB  2 g Intravenous Q8H Cox, Amy N, DO 200 mL/hr at 10/20/20 1602 2 g at 10/20/20 1602   cholecalciferol (VITAMIN D3) tablet 2,000 Units  2,000 Units Oral Daily Cox, Amy N, DO    2,000 Units at 10/20/20 0847   DULoxetine (CYMBALTA) DR capsule 60 mg  60 mg Oral Daily Cox, Amy N, DO   60 mg at 10/20/20 0849   furosemide (LASIX) injection 40 mg  40 mg Intravenous Daily Gwynne Edinger, MD   40 mg at 10/20/20 1329   heparin ADULT infusion 100 units/mL (25000 units/278mL)  1,100 Units/hr Intravenous Continuous Lu Duffel, RPH 11 mL/hr at 10/20/20 1203 1,100 Units/hr at 10/20/20 1203   hydrALAZINE (APRESOLINE) tablet 25 mg  25 mg Oral Q8H PRN Cox, Amy N, DO       insulin aspart (novoLOG) injection 0-15 Units  0-15 Units Subcutaneous TID WC Cox, Amy N, DO   3 Units at 10/20/20 1559   insulin aspart (novoLOG) injection 0-5 Units  0-5 Units Subcutaneous QHS Cox, Amy N, DO       insulin glargine (LANTUS) injection 6 Units  6 Units Subcutaneous QHS Cox, Amy N, DO       ketorolac (TORADOL) 15 MG/ML injection 15 mg  15 mg Intravenous Q8H PRN Cox, Amy N, DO   15 mg at 10/20/20 0703   metoprolol tartrate (LOPRESSOR) tablet 50 mg  50 mg Oral BID Cox, Amy N, DO   50 mg at 10/20/20 0846   metroNIDAZOLE (FLAGYL) IVPB 500 mg  500 mg Intravenous Q8H  Gwynne Edinger, MD 100 mL/hr at 10/20/20 1040 500 mg at 10/20/20 1040   morphine 2 MG/ML injection 1 mg  1 mg Intravenous Q4H PRN Cox, Amy N, DO   1 mg at 10/20/20 1333   ondansetron (ZOFRAN) tablet 4 mg  4 mg Oral Q6H PRN Cox, Amy N, DO       Or   ondansetron (ZOFRAN) injection 4 mg  4 mg Intravenous Q6H PRN Cox, Amy N, DO       oxybutynin (DITROPAN) tablet 5 mg  5 mg Oral BID Cox, Amy N, DO   5 mg at 10/20/20 0849   pantoprazole (PROTONIX) EC tablet 40 mg  40 mg Oral Daily Cox, Amy N, DO   40 mg at 10/20/20 0846   pregabalin (LYRICA) capsule 100 mg  100 mg Oral TID Cox, Amy N, DO   100 mg at 10/20/20 1602   tiZANidine (ZANAFLEX) tablet 4 mg  4 mg Oral TID PRN Cox, Amy N, DO   4 mg at 10/20/20 1614   vancomycin (VANCOREADY) IVPB 1250 mg/250 mL  1,250 mg Intravenous Q24H Cox, Amy N, DO       Past Medical History:  Diagnosis Date    Anxiety    Atrial fibrillation (HCC)    Atrial flutter (HCC)    Coronary artery disease    a. 06/2014 NSTEMI s/p CABG x 4 (LIMA to LAD, VG to Diag, VG to OM, VG to PDA).   Depression    Diabetic neuropathy (HCC)    GERD (gastroesophageal reflux disease)    HTN (hypertension)    Hyperlipidemia LDL goal <70    IDDM (insulin dependent diabetes mellitus)    Ischemic cardiomyopathy    a. 06/2014 Echo: EF 40-45%, HK of entire inferolateral and inferior myocardium c/w infarct of RCA/LCx, GR2DD, mild MR   Myocardial infarction ([place]) 2016   OA (osteoarthritis) of knee    Obesity    Osteomyelitis ([place])    Peripheral neuropathy    Peripheral vascular disease ([place])    a. 09/2016 Periph Angio: CTO R popliteal, CTO L prox/mid SFA->Med Rx; b. 05/2017 PTA: LSFA (Viabahn covered stent x 2), DEB to L Post Tibial; c. 06/2017 ABI: R 0.44, L 1.05.   Tobacco abuse    Past Surgical History:  Procedure Laterality Date   ABDOMINAL AORTOGRAM W/LOWER EXTREMITY N/A 10/27/2016   Procedure: Abdominal Aortogram w/Lower Extremity;  Surgeon: Nelva Bush, MD;  Location: [place] [facility];  Service: Cardiovascular;  Laterality: N/A;   AMPUTATION TOE Left 10/21/2015   Procedure: AMPUTATION TOE;  Surgeon: Sharlotte Alamo, DPM;  Location: [facility];  Service: Podiatry;  Laterality: Left;   AMPUTATION TOE Left 05/15/2017   Procedure: AMPUTATION LEFT GREAT TOE;  Surgeon: Sharlotte Alamo, DPM;  Location: [facility];  Service: Podiatry;  Laterality: Left;   AORTIC VALVE REPLACEMENT (AVR)/CORONARY ARTERY BYPASS GRAFTING (CABG)     BREAST BIOPSY     CARDIAC CATHETERIZATION  06/2014   95% stenosis mLAD, occlusion ostial OM1, 70% stenosis LCx, 95% stenosis mRCA, EF 45%.   CARDIOVERSION N/A 04/14/2020   Procedure: CARDIOVERSION with TEE;  Surgeon: Minna Merritts, MD;  Location: [facility];  Service: Cardiovascular;  Laterality: N/A;   CARDIOVERSION N/A 05/28/2020   Procedure: CARDIOVERSION;  Surgeon: Minna Merritts, MD;  Location:  [facility];  Service: Cardiovascular;  Laterality: N/A;   CORONARY ARTERY BYPASS GRAFT N/A 06/08/2014   Procedure: CORONARY ARTERY BYPASS GRAFTING (CABG);  Surgeon: Grace Isaac, MD;  Location:  [facility];  Service: Open Heart Surgery;  Laterality: N/A;  Times 4 using left internal mammary artery to LAD artery and endoscopically harvested bilateral saphenous vein to Obtuse Marginal, Diagonal and Posterior Descending coronary arteries.   CT ABD W & PELVIS WO CM  06/2014   nl liver, gallbladder, spleen, mild diverticular changes, no bowel wall inflammation, appendix nl, no hernia, no other sig abnormalities   GASTRIC ROUX-EN-Y N/A 02/09/2020   Procedure: LAPAROSCOPIC ROUX-EN-Y GASTRIC BYPASS WITH UPPER ENDOSCOPY;  Surgeon: Johnathan Hausen, MD;  Location: [facility];  Service: General;  Laterality: N/A;   LOWER EXTREMITY ANGIOGRAPHY Left 05/23/2017   Procedure: LOWER EXTREMITY ANGIOGRAPHY;  Surgeon: Algernon Huxley, MD;  Location: [place] [facility];  Service: Cardiovascular;  Laterality: Left;   LOWER EXTREMITY ANGIOGRAPHY Left 05/28/2017   Procedure: LOWER EXTREMITY ANGIOGRAPHY;  Surgeon: Algernon Huxley, MD;  Location: [facility];  Service: Cardiovascular;  Laterality: Left;   LOWER EXTREMITY ANGIOGRAPHY Left 12/16/2018   Procedure: LOWER EXTREMITY ANGIOGRAPHY;  Surgeon: Algernon Huxley, MD;  Location: [facility];  Service: Cardiovascular;  Laterality: Left;   LOWER EXTREMITY ANGIOGRAPHY Right 12/23/2018   Procedure: LOWER EXTREMITY ANGIOGRAPHY;  Surgeon: Algernon Huxley, MD;  Location: [facility] [facility];  Service: Cardiovascular;  Laterality: Right;   TEE WITHOUT CARDIOVERSION N/A 06/08/2014   Procedure: TRANSESOPHAGEAL ECHOCARDIOGRAM (TEE);  Surgeon: Grace Isaac, MD;  Location: [facility] [place] [facility];  Service: Open Heart Surgery;  Laterality: N/A;   TEE WITHOUT CARDIOVERSION N/A 04/14/2020   Procedure: TRANSESOPHAGEAL ECHOCARDIOGRAM (TEE);  Surgeon: Minna Merritts, MD;  Location: [facility];  Service:  Cardiovascular;  Laterality: N/A;   TOE AMPUTATION Right 12/2011   rt middle toe   UPPER GI ENDOSCOPY N/A 02/09/2020   Procedure: UPPER GI ENDOSCOPY;  Surgeon: Johnathan Hausen, MD;  Location: [facility];  Service: General;  Laterality: N/A;   US ECHOCARDIOGRAPHY  06/2014   EF 50-55%, HK of inf/post/inferolat walls, Ao sclerosis   Social History Social History   Tobacco Use   Smoking status: Former    Packs/day: 0.50    Years: 40.00    Pack years: 20.00    Types: Cigarettes   Smokeless tobacco: Never   Tobacco comments:    started Chantix 04/12/20  Vaping Use   Vaping Use: Never used  Substance Use Topics   Alcohol use: No    Alcohol/week: 0.0 standard drinks   Drug use: Not Currently    Comment: crack   Family History Family History  Problem Relation Age of Onset   CAD Mother    Hypertension Mother    Diabetes Mother    CAD Father    Diabetes Father    Sickle cell trait Other    Asthma Other   Denies family history of peripheral artery disease, venous disease or renal disease.  No Known Allergies  REVIEW OF SYSTEMS (Negative unless checked)  Constitutional: [] Weight loss  [] Fever  [] Chills Cardiac: [] Chest pain   [] Chest pressure   [] Palpitations   [] Shortness of breath when laying flat   [] Shortness of breath at rest   [] Shortness of breath with exertion. Vascular:  [x] Pain in legs with walking   [x] Pain in legs at rest   [x] Pain in legs when laying flat   [] Claudication   [] Pain in feet when walking  [] Pain in feet at rest  [] Pain in feet when laying flat   [] History of DVT   [] Phlebitis   [] Swelling in legs   [] Varicose  veins   [] Non-healing ulcers Pulmonary:   [] Uses home oxygen   [] Productive cough   [] Hemoptysis   [] Wheeze  [] COPD   [] Asthma Neurologic:  [] Dizziness  [] Blackouts   [] Seizures   [] History of stroke   [] History of TIA  [] Aphasia   [] Temporary blindness   [] Dysphagia   [] Weakness or numbness in arms   [] Weakness or numbness in legs Musculoskeletal:   [] Arthritis   [] Joint swelling   [] Joint pain   [] Low back pain Hematologic:  [] Easy bruising  [] Easy bleeding   [] Hypercoagulable state   [] Anemic  [] Hepatitis Gastrointestinal:  [] Blood in stool   [] Vomiting blood  [] Gastroesophageal reflux/heartburn   [] Difficulty swallowing. Genitourinary:  [] Chronic kidney disease   [] Difficult urination  [] Frequent urination  [] Burning with urination   [] Blood in urine Skin:  [] Rashes   Ulcers   [x] Wounds Psychological:  [] History of anxiety   []  History of major depression.  Physical Examination  Vitals:   10/20/20 0900 10/20/20 0905 10/20/20 1150 10/20/20 1533  BP:   126/76 120/75  Pulse:   (!) 101 100  Resp:   18 18  Temp:   98 F (36.7 C) 98.1 F (36.7 C)  TempSrc:    Oral  SpO2: (!) 87% 95% 92% 100%  Weight:      Height:       Body mass index is 37.1 kg/m. Gen:  WD/WN, NAD Head: [place]/AT, No temporalis wasting. Prominent temp pulse not noted. Ear/Nose/Throat: Hearing grossly intact, nares w/o erythema or drainage, oropharynx w/o Erythema/Exudate Eyes: Sclera non-icteric, conjunctiva clear Neck: Trachea midline.  No JVD.  Pulmonary:  Good air movement, respirations not labored, equal bilaterally.  Cardiac: RRR, normal S1, S2. Vascular:  Vessel Right Left  Radial Palpable Palpable  Ulnar Palpable Palpable  Brachial Palpable Palpable  Carotid Palpable, without bruit Palpable, without bruit  Aorta Not palpable N/A  Femoral Palpable Palpable  Popliteal Palpable Palpable  PT Non-Palpable Non-Palpable  DP Non-Palpable Non-Palpable   Right Lower Extremity: Thigh soft, calf soft. Foul oder noted. Necrotic heel wound. Lateral ankle wound.   Gastrointestinal: soft, non-tender/non-distended. No guarding/reflex.  Musculoskeletal: M/S 5/5 throughout.  Extremities without ischemic changes.  No deformity or atrophy. No edema. Neurologic: Sensation grossly intact in extremities.  Symmetrical.  Speech is fluent. Motor exam as listed  above. Psychiatric: Judgment intact, Mood & affect appropriate for pt's clinical situation. Dermatologic: As above. Lymph : No Cervical, Axillary, or Inguinal lymphadenopathy.  CBC Lab Results  Component Value Date   WBC 15.0 (H) 10/20/2020   HGB 9.7 (L) 10/20/2020   HCT 29.0 (L) 10/20/2020   MCV 76.7 (L) 10/20/2020   PLT 342 10/20/2020   BMET    Component Value Date/Time   NA 139 10/20/2020 0930   NA 139 10/10/2016 1911   NA 140 06/03/2014 0405   K 3.2 (L) 10/20/2020 0930   K 4.1 06/03/2014 0405   CL 100 10/20/2020 0930   CL 107 06/03/2014 0405   CO2 31 10/20/2020 0930   CO2 26 06/03/2014 0405   GLUCOSE 158 (H) 10/20/2020 0930   GLUCOSE 227 (H) 06/03/2014 0405   BUN 12 10/20/2020 0930   BUN 16 10/10/2016 1911   BUN 13 06/03/2014 0405   CREATININE 1.10 (H) 10/20/2020 0930   CREATININE 1.13 06/03/2014 0405   CALCIUM 7.9 (L) 10/20/2020 0930   CALCIUM 8.1 (L) 06/03/2014 0405   GFRNONAA 55 (L) 10/20/2020 0930   GFRNONAA 52 (L) 06/03/2014 0405   GFRNONAA 54 (L) 11/28/2013 1754  GFRAA 51 (L) 02/02/2020 1110   GFRAA >60 06/03/2014 0405   GFRAA >60 11/28/2013 1754   Estimated Creatinine Clearance: 64.7 mL/min (A) (by C-G formula based on SCr of 1.1 mg/dL (H)).  COAG Lab Results  Component Value Date   INR 1.9 (H) 10/20/2020   INR 2.0 (H) 10/01/2020   INR 1.03 05/14/2017   Radiology DG Chest 2 View  Result Date: 10/07/2020 CLINICAL DATA:  Chest pain and right leg swelling. EXAM: CHEST - 2 VIEW COMPARISON:  August 22, 2020 FINDINGS: The heart size and mediastinal contours are stable. Heart size enlarged. Mild atelectasis is identified in bilateral lung bases. There is no pulmonary edema or pleural effusion. The visualized skeletal structures are unremarkable. IMPRESSION: No active cardiopulmonary disease. Electronically Signed   By: Abelardo Diesel M.D.   On: 10/07/2020 19:24   DG Os Calcis Right  Result Date: 10/15/2020 CLINICAL DATA:  Open wound heel EXAM: RIGHT OS  CALCIS - 2+ VIEW COMPARISON:  02/18/2019 FINDINGS: Negative for fracture or osteomyelitis. Mild calcaneal spurring.  Mild arterial calcification. IMPRESSION: No acute skeletal abnormality. Electronically Signed   By: Franchot Gallo M.D.   On: 10/18/2020 16:24   MR ANKLE RIGHT WO CONTRAST  Result Date: 10/20/2020 CLINICAL DATA:  Osteomyelitis suspected, ankle, xray done Necrotic heel ulcer. Suspect osteomyelitis. EXAM: MRI OF THE RIGHT ANKLE WITHOUT CONTRAST TECHNIQUE: Multiplanar, multisequence MR imaging of the ankle was performed. No intravenous contrast was administered. COMPARISON:  Radiograph 10/09/2020 FINDINGS: TENDONS Peroneal: There is focal mild tenosynovitis of the peroneal longus and brevis tendons just below the lateral malleolus. There is intramuscular edema proximally. Posteromedial: Posterior tibial tendon intact. Flexor digitorum longus tendon intact. Flexor hallucis longus tendon intact. Anterior: Tibialis anterior tendon intact. Extensor hallucis longus tendon intact Extensor digitorum longus tendon intact. Achilles:  Intact. Plantar Fascia: Intact. LIGAMENTS Lateral: Anterior talofibular ligament intact. Calcaneofibular ligament intact. Posterior talofibular ligament intact. Anterior and posterior tibiofibular ligaments intact. Medial: Deltoid ligament intact. Spring ligament intact. CARTILAGE Ankle Joint: No joint effusion. Normal ankle mortise. No chondral defect. There is mild midfoot arthritis. Subtalar Joints/Sinus Tarsi: Normal subtalar joints. No subtalar joint effusion. Normal sinus tarsi. Bones: There is bony edema in some associated low T1 signal along the lateral inferior calcaneus in likely cortical disruption. This is underlying a soft tissue ulcer with surrounding edema and subcutaneous gas. Soft Tissue: Lateral hindfoot ulcer as above. There is no well-defined soft tissue abscess. Extensive intramuscular and subcutaneous edema in the foot. IMPRESSION: Lateral hindfoot ulcer  with underlying osteomyelitis of the inferolateral calcaneus, subcutaneous soft tissue gas and edema. No no well-defined soft tissue abscess. Focal mild tenosynovitis of the peroneal longus and brevis tendons just below the lateral malleolus. Electronically Signed   By: Maurine Simmering   On: 10/20/2020 07:19   US Venous Img Lower Unilateral Right  Result Date: 10/07/2020 CLINICAL DATA:  Right leg swelling EXAM: RIGHT LOWER EXTREMITY VENOUS DOPPLER ULTRASOUND TECHNIQUE: Gray-scale sonography with compression, as well as color and duplex ultrasound, were performed to evaluate the deep venous system(s) from the level of the common femoral vein through the popliteal and proximal calf veins. COMPARISON:  None. FINDINGS: VENOUS Normal compressibility of the common femoral, superficial femoral, and popliteal veins, as well as the visualized calf veins. Visualized portions of profunda femoral vein and great saphenous vein unremarkable. No filling defects to suggest DVT on grayscale or color Doppler imaging. Doppler waveforms show normal direction of venous flow, normal respiratory plasticity and response to augmentation. Limited views  of the contralateral common femoral vein are unremarkable. OTHER Markedly biphasic venous waveform within the common femoral veins bilaterally suggests right heart failure or tricuspid regurgitation. Limitations: none IMPRESSION: No femoropopliteal DVT within the right lower extremity. Biphasic venous waveform within the common femoral vein suggesting right heart failure and/or tricuspid regurgitation. Electronically Signed   By: Fidela Salisbury MD   On: 10/07/2020 21:01   DG Chest Port 1 View  Result Date: 10/20/2020 CLINICAL DATA:  Hypoxia. EXAM: PORTABLE CHEST 1 VIEW COMPARISON:  10/07/2020. FINDINGS: Prior CABG. Cardiomegaly. Diffuse bilateral pulmonary infiltrates/edema. No pleural effusion or pneumothorax. Degenerative change thoracic spine. IMPRESSION: 1.  Prior CABG.  Cardiomegaly.  2.  Diffuse bilateral pulmonary infiltrates/edema. Electronically Signed   By: Marcello Moores  Register   On: 10/20/2020 10:26   DG HIP UNILAT WITH PELVIS 2-3 VIEWS LEFT  Result Date: 09/25/2020 CLINICAL DATA:  Fall, left hip pain EXAM: DG HIP (WITH OR WITHOUT PELVIS) 2-3V LEFT COMPARISON:  None. FINDINGS: No fracture or dislocation is seen. The joint spaces are preserved. Visualized bony pelvis appears intact. Degenerative changes of the lower lumbar spine. Left groin stents. IMPRESSION: No fracture or dislocation is seen. Electronically Signed   By: Julian Hy M.D.   On: 09/25/2020 06:14    Assessment/Plan Melinda [name] is a 65 year old female with medical history significant for CKD 3B, insulin-dependent diabetes mellitus, anxiety, depression, hypertension, osteoarthritis, peripheral artery disease, sickle cell trait, tobacco dependency, status post left first toe amputation, right toe amputation, who presents to the emergency department for chief concerns of right heel wound.  1.  Atherosclerotic disease with chronic gangrenous: Patient with known history of atherosclerotic disease to the bilateral lower extremity.  Patient presents with known history of atherosclerotic disease and chronic wounds to the right heel with gangrenous changes.  Recommend the patient undergo a repeat angiogram with possible intervention and attempt assess her anatomy and contributing degree of atherosclerotic disease.  If appropriate an attempt to revascularize the leg can be made at that time.  We will plan on this on Thursday with Dr. Lucky Cowboy.  Procedure, risks and benefits were explained to the patient.  All questions were answered.  Patient wishes to proceed.  2.  Diabetes: On appropriate medications. Encouraged good control as its slows the progression of atherosclerotic disease   3.  Hyperlipidemia: On Statin for medical management. Also on Eliquis. Encouraged good control as its slows the progression of  atherosclerotic disease   4.  Tobacco abuse: We had a discussion for approximately three minutes regarding the absolute need for smoking cessation due to the deleterious nature of tobacco on the vascular system. We discussed the tobacco use would diminish patency of any intervention, and likely significantly worsen progression of disease. We discussed multiple agents for quitting including replacement therapy or medications to reduce cravings such as Chantix. The patient voices their understanding of the importance of smoking cessation.   Discussed with Dr. Mayme Genta, [name]  10/20/2020 4:16 PM  This note was created with Dragon medical transcription system.  Any error is purely unintentional.

## 2020-10-20 NOTE — Progress Notes (Signed)
Pt strict I an o but she voided a large amount incontinently. Filled a pad and soaked through the chair, she was dry but when we got up she just started peeing and couldn't stop so she sat on the chair and filled it up

## 2020-10-20 NOTE — Progress Notes (Signed)
  Chaplain On-Call responded to Order Requisition from [employer].  The Order Requisition read "create or update Advance Directive".  Chaplain provided the documents and education to the patient.  The patient will contact Chaplains if additional support is needed.  Chaplain Pollyann Samples M.Div., [facility]

## 2020-10-20 NOTE — Consult Note (Signed)
ANTICOAGULATION CONSULT NOTE - Follow Up Consult  Pharmacy Consult for Heparin Drip Indication: atrial fibrillation  No Known Allergies  Patient Measurements: Height: 5\' 8"  (172.7 cm) Weight: 110.7 kg (244 lb) IBW/kg (Calculated) : 63.9 Heparin dosing wt 89.1 kg  Vital Signs: Temp: 98.1 F (36.7 C) (06/22 0809) BP: 131/77 (06/22 0809) Pulse Rate: 102 (06/22 0809)  Labs: Recent Labs    10/10/2020 1419 10/27/2020 1649  HGB 10.0*  --   HCT 29.8*  --   PLT 351  --   APTT  --  48*  LABPROT  --  23.0*  INR  --  2.0*  CREATININE 1.13*  --     Estimated Creatinine Clearance: 63 mL/min (A) (by C-G formula based on SCr of 1.13 mg/dL (H)).   Medications:  PTA apixiban dose 5mg  bid - last dose 6/21 @ 1000  Assessment: 65 yo female with history of a fib and PAD - pharmacy has been consulted to initiate and monitor a heparin drip pending surgical plan.  Baseline HGB and APTT as above.   Goal of Therapy:  Heparin level 0.3-0.7 units/ml Monitor platelets by anticoagulation protocol: Yes   Plan:  Will start heparin drip with 4000 unit bolus, followed by 1100 units/hr  Will obtain baseline HL, and will follow with APTT in 6 hours per protocol  Lu Duffel, PharmD, BCPS Clinical Pharmacist 10/20/2020 9:40 AM

## 2020-10-20 NOTE — Plan of Care (Signed)

## 2020-10-20 NOTE — Consult Note (Signed)
ANTICOAGULATION CONSULT NOTE - Follow Up Consult  Pharmacy Consult for Heparin Drip Indication: atrial fibrillation  No Known Allergies  Patient Measurements: Height: 5\' 8"  (172.7 cm) Weight: 110.7 kg (244 lb) IBW/kg (Calculated) : 63.9 Heparin dosing wt 89.1 kg  Vital Signs: Temp: 98.1 F (36.7 C) (06/22 1533) Temp Source: Oral (06/22 1533) BP: 120/75 (06/22 1533) Pulse Rate: 100 (06/22 1533)  Labs: Recent Labs    10/18/2020 1419 10/12/2020 1649 10/20/20 0924 10/20/20 0930 10/20/20 1559  HGB 10.0*  --   --  9.7*  --   HCT 29.8*  --   --  29.0*  --   PLT 351  --   --  342  --   APTT  --  48*  --   --  45*  LABPROT  --  23.0*  --  21.6*  --   INR  --  2.0*  --  1.9*  --   HEPARINUNFRC  --   --  >1.10*  --   --   CREATININE 1.13*  --   --  1.10*  --      Estimated Creatinine Clearance: 64.7 mL/min (A) (by C-G formula based on SCr of 1.1 mg/dL (H)).   Medications:  PTA apixiban dose 5mg  bid - last dose 6/21 @ 1000  Assessment: 65 yo female with history of a fib and PAD - pharmacy has been consulted to initiate and monitor a heparin drip pending surgical plan.  Baseline HGB and APTT as above.    Goal of Therapy:  Heparin level 0.3-0.7 units/ml Monitor platelets by anticoagulation protocol: Yes   Plan:  06/22@1559 : 45 sec, subtherapeutic. Will rebolus 2500 units x 1 and will increase to 1400 units.  Will follow with APTT in 6 hours per protocol, and HL daily until levels correlate  Pearla Dubonnet, PharmD Clinical Pharmacist 10/20/2020 5:06 PM

## 2020-10-20 NOTE — Progress Notes (Signed)
ORTHOPAEDIC PROGRESS NOTE  REQUESTING PHYSICIAN: Wouk, Ailene Rud, MD  Chief Complaint: Worsening right heel ulceration.  HPI: Melinda [name] is a 65 y.o. female who presents today resting in bed eating lunch.  Patient had an MRI performed yesterday and is awaiting the results.  Patient is also not seen vascular surgery yet and is awaiting their consultation.  Patient once again has longstanding history of diabetes with polyneuropathy as well as peripheral vascular disease and has undergone multiple selective digital amputations in the past.  Patient currently denies nausea, vomiting, fever, chills.  Past Medical History:  Diagnosis Date   Anxiety    Atrial fibrillation (HCC)    Atrial flutter ([place])    Coronary artery disease    a. 06/2014 NSTEMI s/p CABG x 4 (LIMA to LAD, VG to Diag, VG to OM, VG to PDA).   Depression    Diabetic neuropathy (HCC)    GERD (gastroesophageal reflux disease)    HTN (hypertension)    Hyperlipidemia LDL goal <70    IDDM (insulin dependent diabetes mellitus)    Ischemic cardiomyopathy    a. 06/2014 Echo: EF 40-45%, HK of entire inferolateral and inferior myocardium c/w infarct of RCA/LCx, GR2DD, mild MR   Myocardial infarction ([place]) 2016   OA (osteoarthritis) of knee    Obesity    Osteomyelitis ([place])    Peripheral neuropathy    Peripheral vascular disease ([place])    a. 09/2016 Periph Angio: CTO R popliteal, CTO L prox/mid SFA->Med Rx; b. 05/2017 PTA: LSFA (Viabahn covered stent x 2), DEB to L Post Tibial; c. 06/2017 ABI: R 0.44, L 1.05.   Tobacco abuse    Past Surgical History:  Procedure Laterality Date   ABDOMINAL AORTOGRAM W/LOWER EXTREMITY N/A 10/27/2016   Procedure: Abdominal Aortogram w/Lower Extremity;  Surgeon: Nelva Bush, MD;  Location: [facility];  Service: Cardiovascular;  Laterality: N/A;   AMPUTATION TOE Left 10/21/2015   Procedure: AMPUTATION TOE;  Surgeon: Sharlotte Alamo, DPM;  Location: [facility];  Service: Podiatry;   Laterality: Left;   AMPUTATION TOE Left 05/15/2017   Procedure: AMPUTATION LEFT GREAT TOE;  Surgeon: Sharlotte Alamo, DPM;  Location: [facility];  Service: Podiatry;  Laterality: Left;   AORTIC VALVE REPLACEMENT (AVR)/CORONARY ARTERY BYPASS GRAFTING (CABG)     BREAST BIOPSY     CARDIAC CATHETERIZATION  06/2014   95% stenosis mLAD, occlusion ostial OM1, 70% stenosis LCx, 95% stenosis mRCA, EF 45%.   CARDIOVERSION N/A 04/14/2020   Procedure: CARDIOVERSION with TEE;  Surgeon: Minna Merritts, MD;  Location: [facility];  Service: Cardiovascular;  Laterality: N/A;   CARDIOVERSION N/A 05/28/2020   Procedure: CARDIOVERSION;  Surgeon: Minna Merritts, MD;  Location: [facility];  Service: Cardiovascular;  Laterality: N/A;   CORONARY ARTERY BYPASS GRAFT N/A 06/08/2014   Procedure: CORONARY ARTERY BYPASS GRAFTING (CABG);  Surgeon: Grace Isaac, MD;  Location: [place];  Service: Open Heart Surgery;  Laterality: N/A;  Times 4 using left internal mammary artery to LAD artery and endoscopically harvested bilateral saphenous vein to Obtuse Marginal, Diagonal and Posterior Descending coronary arteries.   CT ABD W & PELVIS WO CM  06/2014   nl liver, gallbladder, spleen, mild diverticular changes, no bowel wall inflammation, appendix nl, no hernia, no other sig abnormalities   GASTRIC ROUX-EN-Y N/A 02/09/2020   Procedure: LAPAROSCOPIC ROUX-EN-Y GASTRIC BYPASS WITH UPPER ENDOSCOPY;  Surgeon: Johnathan Hausen, MD;  Location: [facility];  Service: General;  Laterality: N/A;   LOWER EXTREMITY ANGIOGRAPHY Left  05/23/2017   Procedure: LOWER EXTREMITY ANGIOGRAPHY;  Surgeon: Algernon Huxley, MD;  Location: [place] [facility];  Service: Cardiovascular;  Laterality: Left;   LOWER EXTREMITY ANGIOGRAPHY Left 05/28/2017   Procedure: LOWER EXTREMITY ANGIOGRAPHY;  Surgeon: Algernon Huxley, MD;  Location: [place] [facility];  Service: Cardiovascular;  Laterality: Left;   LOWER EXTREMITY ANGIOGRAPHY Left 12/16/2018   Procedure: LOWER EXTREMITY  ANGIOGRAPHY;  Surgeon: Algernon Huxley, MD;  Location: [facility];  Service: Cardiovascular;  Laterality: Left;   LOWER EXTREMITY ANGIOGRAPHY Right 12/23/2018   Procedure: LOWER EXTREMITY ANGIOGRAPHY;  Surgeon: Algernon Huxley, MD;  Location: [facility];  Service: Cardiovascular;  Laterality: Right;   TEE WITHOUT CARDIOVERSION N/A 06/08/2014   Procedure: TRANSESOPHAGEAL ECHOCARDIOGRAM (TEE);  Surgeon: Grace Isaac, MD;  Location: [place];  Service: Open Heart Surgery;  Laterality: N/A;   TEE WITHOUT CARDIOVERSION N/A 04/14/2020   Procedure: TRANSESOPHAGEAL ECHOCARDIOGRAM (TEE);  Surgeon: Minna Merritts, MD;  Location: [facility];  Service: Cardiovascular;  Laterality: N/A;   TOE AMPUTATION Right 12/2011   rt middle toe   UPPER GI ENDOSCOPY N/A 02/09/2020   Procedure: UPPER GI ENDOSCOPY;  Surgeon: Johnathan Hausen, MD;  Location: [facility];  Service: General;  Laterality: N/A;   US ECHOCARDIOGRAPHY  06/2014   EF 50-55%, HK of inf/post/inferolat walls, Ao sclerosis   Social History   Socioeconomic History   Marital status: Widowed    Spouse name: Not on file   Number of children: Not on file   Years of education: Not on file   Highest education level: Not on file  Occupational History   Not on file  Tobacco Use   Smoking status: Former    Packs/day: 0.50    Years: 40.00    Pack years: 20.00    Types: Cigarettes   Smokeless tobacco: Never   Tobacco comments:    started Chantix 04/12/20  Vaping Use   Vaping Use: Never used  Substance and Sexual Activity   Alcohol use: No    Alcohol/week: 0.0 standard drinks   Drug use: Not Currently    Comment: crack   Sexual activity: Not on file  Other Topics Concern   Not on file  Social History Narrative   Not on file   Social Determinants of Health   Financial Resource Strain: Not on file  Food Insecurity: Not on file  Transportation Needs: Not on file  Physical Activity: Not on file  Stress: Not on file  Social  Connections: Not on file   Family History  Problem Relation Age of Onset   CAD Mother    Hypertension Mother    Diabetes Mother    CAD Father    Diabetes Father    Sickle cell trait Other    Asthma Other    No Known Allergies Prior to Admission medications   Medication Sig Start Date End Date Taking? Authorizing Provider  amiodarone (PACERONE) 200 MG tablet Take 1 tablet (200 mg total) by mouth 2 (two) times daily. 09/03/20  Yes Hendricks Limes, MD  amLODipine (NORVASC) 5 MG tablet Take 5 mg by mouth daily.   Yes [provider]  apixaban (ELIQUIS) 5 MG TABS tablet Take 1 tablet (5 mg total) by mouth 2 (two) times daily. 09/29/2020  Yes Vickie Epley, MD  atorvastatin (LIPITOR) 80 MG tablet Take 1 tablet (80 mg total) by mouth daily at 6 PM. 09/03/20  Yes Hendricks Limes, MD  Cholecalciferol (  VITAMIN D3) 50 MCG (2000 UT) TABS Take 2,000 Units by mouth daily.   Yes [provider]  clotrimazole (LOTRIMIN) 1 % cream Apply 1 application topically 2 (two) times daily as needed.   Yes [provider]  docusate sodium (COLACE) 100 MG capsule Take 100 mg by mouth 2 (two) times daily.   Yes [provider]  DULoxetine (CYMBALTA) 60 MG capsule Take 1 capsule (60 mg total) by mouth daily. 09/03/20  Yes Hendricks Limes, MD  Emollient (CERAVE EX) Apply 1 application topically 2 (two) times daily as needed (dry skin).   Yes [provider]  furosemide (LASIX) 20 MG tablet Take 2 tablets (40 mg total) by mouth daily as needed (fluid retention.). 09/03/20  Yes Hendricks Limes, MD  insulin aspart (NOVOLOG) 100 UNIT/ML injection Inject 4 Units into the skin 3 (three) times daily with meals. 08/25/20  Yes Wieting, Richard, MD  metoprolol tartrate (LOPRESSOR) 50 MG tablet TAKE 1 TABLET TWICE DAILY Patient taking differently: Take 50 mg by mouth 2 (two) times daily. 09/24/20  Yes Vickie Epley, MD  oxybutynin (DITROPAN) 5 MG tablet Take 5 mg by mouth 2 (two)  times daily.  05/10/17  Yes [provider]  pantoprazole (PROTONIX) 40 MG tablet Take 1 tablet (40 mg total) by mouth daily. 09/03/20  Yes Hendricks Limes, MD  potassium chloride SA (KLOR-CON) 20 MEQ tablet TAKE 1 TABLET (20 MEQ TOTAL) BY MOUTH DAILY AS NEEDED. Patient taking differently: Take 20 mEq by mouth daily as needed (potassium replacement with furosemide). 09/03/20  Yes Hendricks Limes, MD  pregabalin (LYRICA) 100 MG capsule Take 1 capsule (100 mg total) by mouth 3 (three) times daily. 09/08/20  Yes Medina-Vargas, Monina C, NP  Semaglutide,0.25 or 0.5MG /DOS, (OZEMPIC, 0.25 OR 0.5 MG/DOSE,) 2 MG/1.5ML SOPN Inject 0.5 mg into the skin every Friday.   Yes [provider]  tiZANidine (ZANAFLEX) 4 MG capsule Take 1 capsule (4 mg total) by mouth 3 (three) times daily as needed for muscle spasms. 09/06/20  Yes Medina-Vargas, Monina C, NP  diltiazem (CARDIZEM) 60 MG tablet Take 1 tablet (60 mg total) by mouth 2 (two) times daily. Patient not taking: No sig reported 09/03/20   Hendricks Limes, MD  insulin glargine (LANTUS) 100 UNIT/ML injection Inject 0.06 mLs (6 Units total) into the skin at bedtime. Patient not taking: No sig reported 09/03/20   Hendricks Limes, MD   DG Os Calcis Right  Result Date: 10/08/2020 CLINICAL DATA:  Open wound heel EXAM: RIGHT OS CALCIS - 2+ VIEW COMPARISON:  02/18/2019 FINDINGS: Negative for fracture or osteomyelitis. Mild calcaneal spurring.  Mild arterial calcification. IMPRESSION: No acute skeletal abnormality. Electronically Signed   By: Franchot Gallo M.D.   On: 10/11/2020 16:24   MR ANKLE RIGHT WO CONTRAST  Result Date: 10/20/2020 CLINICAL DATA:  Osteomyelitis suspected, ankle, xray done Necrotic heel ulcer. Suspect osteomyelitis. EXAM: MRI OF THE RIGHT ANKLE WITHOUT CONTRAST TECHNIQUE: Multiplanar, multisequence MR imaging of the ankle was performed. No intravenous contrast was administered. COMPARISON:  Radiograph 10/05/2020 FINDINGS: TENDONS  Peroneal: There is focal mild tenosynovitis of the peroneal longus and brevis tendons just below the lateral malleolus. There is intramuscular edema proximally. Posteromedial: Posterior tibial tendon intact. Flexor digitorum longus tendon intact. Flexor hallucis longus tendon intact. Anterior: Tibialis anterior tendon intact. Extensor hallucis longus tendon intact Extensor digitorum longus tendon intact. Achilles:  Intact. Plantar Fascia: Intact. LIGAMENTS Lateral: Anterior talofibular ligament intact. Calcaneofibular ligament intact. Posterior talofibular  ligament intact. Anterior and posterior tibiofibular ligaments intact. Medial: Deltoid ligament intact. Spring ligament intact. CARTILAGE Ankle Joint: No joint effusion. Normal ankle mortise. No chondral defect. There is mild midfoot arthritis. Subtalar Joints/Sinus Tarsi: Normal subtalar joints. No subtalar joint effusion. Normal sinus tarsi. Bones: There is bony edema in some associated low T1 signal along the lateral inferior calcaneus in likely cortical disruption. This is underlying a soft tissue ulcer with surrounding edema and subcutaneous gas. Soft Tissue: Lateral hindfoot ulcer as above. There is no well-defined soft tissue abscess. Extensive intramuscular and subcutaneous edema in the foot. IMPRESSION: Lateral hindfoot ulcer with underlying osteomyelitis of the inferolateral calcaneus, subcutaneous soft tissue gas and edema. No no well-defined soft tissue abscess. Focal mild tenosynovitis of the peroneal longus and brevis tendons just below the lateral malleolus. Electronically Signed   By: Maurine Simmering   On: 10/20/2020 07:19   DG Chest Port 1 View  Result Date: 10/20/2020 CLINICAL DATA:  Hypoxia. EXAM: PORTABLE CHEST 1 VIEW COMPARISON:  10/07/2020. FINDINGS: Prior CABG. Cardiomegaly. Diffuse bilateral pulmonary infiltrates/edema. No pleural effusion or pneumothorax. Degenerative change thoracic spine. IMPRESSION: 1.  Prior CABG.  Cardiomegaly. 2.   Diffuse bilateral pulmonary infiltrates/edema. Electronically Signed   By: [place]   On: 10/20/2020 10:26    Positive ROS: All other systems have been reviewed and were otherwise negative with the exception of those mentioned in the HPI and as above.  12 point ROS was performed.  Physical Exam: General: Alert and oriented.  No apparent distress.  Vascular:  Left foot:Dorsalis Pedis:  absent Posterior Tibial:  absent  Right foot: Dorsalis Pedis:  absent Posterior Tibial:  absent  Neuro:absent protective sensation  Derm: Posterior lateral aspect of the right heel has a large necrotic ulceration with foul odor.  Surrounding superficial necrosis tissue around the posterior aspect of the heel.  The ulcer site probes directly down to bone.  Loose necrotic tissue into the central aspect of the ulceration is noted as well.    At the lateral aspect of the ankle proximal to the heel there also appears to be a superficial dermal type of ulceration present with some serous drainage, wound bed appears to be fiber granular overall, minimal to no erythema or edema present around the periphery of this wound.  Patient has small superficial ulcerative lesions of the lower extremities diffusely throughout at this time as well.  Ortho/MS: Diffuse edema to the right lower leg.  Assessment: 1.  Necrotic posterior lateral right heel ulceration with infection 2.  Ischemic ulcer to the right lateral ankle 3.  Diabetes with neuropathy with diabetic foot ulcer 4.  Severe peripheral vascular disease  Plan: -Patient seen and examined today. -MRI and x-ray imaging reviewed and discussed with patient in detail.  Appears to show early osteomyelitis of the posterior plantar aspect of the calcaneus at the lateral aspect as well in the area of the open ulceration where there is a large amount of necrotic and foul-smelling tissue. -At minimum patient needs surgical debridement with potentially partial  calcanectomy and application of antibiotic beads.  Believe that this is overall a poor prognosis for patient as she is at very high risk for limb loss due to significant PVD as well as diabetes.  Could potentially attempt this procedure and wound VAC therapy but if vascular deems that she does not have enough blood flow to heal this procedure/wound then ultimately may end up with a more proximal amputation. -Appreciate medicine recommendations for antibiotic therapy. -We  will await vascular recommendations prior to undergoing any further surgical intervention. -Continue Betadine wet-to-dry dressings twice daily.  Applied dressing today.   Caroline More, DPM  10/20/2020 1:23 PM

## 2020-10-20 NOTE — H&P (View-Only) (Signed)
[facility] Consult Note  [name] : [phone]  Melinda [name] is a 65 y.o. (05-08-1955) [name] who presents with chief complaint of  Chief Complaint  Patient presents with   Wound Infection   History of Present Illness:  Melinda [name] is a 65 year old [name] with medical history significant for CKD 3B, insulin-dependent diabetes mellitus, anxiety, depression, hypertension, osteoarthritis, peripheral artery disease, sickle cell trait, tobacco dependency, status post left first toe amputation, right toe amputation, who presents to the emergency department for chief concerns of right heel wound.   She states that the wound started from skin peeling and she pulled it off about 3.5-4 weeks ago.   She follows with wound consult for left 1st toe wound, weekly. She was told by her wound care that she needs to come into the ED for evaluation of the right heel wound. She denies fever. She endorses nausea and vomiting x5 in the last 3 weeks.  She denies diet changes.  Vascular surgery was consulted by Dr. Cathleen Fears for possible endovascular intervention.   Current Facility-Administered Medications  Medication Dose Route Frequency Provider Last Rate Last Admin   acetaminophen (TYLENOL) tablet 650 mg  650 mg Oral Q6H PRN Cox, Amy N, DO   650 mg at 10/20/20 1884   Or   acetaminophen (TYLENOL) suppository 650 mg  650 mg Rectal Q6H PRN Cox, Amy N, DO       amiodarone (PACERONE) tablet 200 mg  200 mg Oral BID Cox, Amy N, DO   200 mg at 10/20/20 0846   amLODipine (NORVASC) tablet 5 mg  5 mg Oral Daily Cox, Amy N, DO   5 mg at 10/20/20 0846   atorvastatin (LIPITOR) tablet 80 mg  80 mg Oral QHS Cox, Amy N, DO   80 mg at 10/09/2020 2127   ceFEPIme (MAXIPIME) 2 g in sodium chloride 0.9 % 100 mL IVPB  2 g Intravenous Q8H Cox, Amy N, DO 200 mL/hr at 10/20/20 1602 2 g at 10/20/20 1602   cholecalciferol (VITAMIN D3) tablet 2,000 Units  2,000 Units Oral Daily Cox, Amy N, DO    2,000 Units at 10/20/20 0847   DULoxetine (CYMBALTA) DR capsule 60 mg  60 mg Oral Daily Cox, Amy N, DO   60 mg at 10/20/20 0849   furosemide (LASIX) injection 40 mg  40 mg Intravenous Daily Gwynne Edinger, MD   40 mg at 10/20/20 1329   heparin ADULT infusion 100 units/mL (25000 units/247mL)  1,100 Units/hr Intravenous Continuous Lu Duffel, RPH 11 mL/hr at 10/20/20 1203 1,100 Units/hr at 10/20/20 1203   hydrALAZINE (APRESOLINE) tablet 25 mg  25 mg Oral Q8H PRN Cox, Amy N, DO       insulin aspart (novoLOG) injection 0-15 Units  0-15 Units Subcutaneous TID WC Cox, Amy N, DO   3 Units at 10/20/20 1559   insulin aspart (novoLOG) injection 0-5 Units  0-5 Units Subcutaneous QHS Cox, Amy N, DO       insulin glargine (LANTUS) injection 6 Units  6 Units Subcutaneous QHS Cox, Amy N, DO       ketorolac (TORADOL) 15 MG/ML injection 15 mg  15 mg Intravenous Q8H PRN Cox, Amy N, DO   15 mg at 10/20/20 0703   metoprolol tartrate (LOPRESSOR) tablet 50 mg  50 mg Oral BID Cox, Amy N, DO   50 mg at 10/20/20 0846   metroNIDAZOLE (FLAGYL) IVPB 500 mg  500 mg Intravenous Q8H  Gwynne Edinger, MD 100 mL/hr at 10/20/20 1040 500 mg at 10/20/20 1040   morphine 2 MG/ML injection 1 mg  1 mg Intravenous Q4H PRN Cox, Amy N, DO   1 mg at 10/20/20 1333   ondansetron (ZOFRAN) tablet 4 mg  4 mg Oral Q6H PRN Cox, Amy N, DO       Or   ondansetron (ZOFRAN) injection 4 mg  4 mg Intravenous Q6H PRN Cox, Amy N, DO       oxybutynin (DITROPAN) tablet 5 mg  5 mg Oral BID Cox, Amy N, DO   5 mg at 10/20/20 0849   pantoprazole (PROTONIX) EC tablet 40 mg  40 mg Oral Daily Cox, Amy N, DO   40 mg at 10/20/20 0846   pregabalin (LYRICA) capsule 100 mg  100 mg Oral TID Cox, Amy N, DO   100 mg at 10/20/20 1602   tiZANidine (ZANAFLEX) tablet 4 mg  4 mg Oral TID PRN Cox, Amy N, DO   4 mg at 10/20/20 1614   vancomycin (VANCOREADY) IVPB 1250 mg/250 mL  1,250 mg Intravenous Q24H Cox, Amy N, DO       Past Medical History:  Diagnosis Date    Anxiety    Atrial fibrillation (HCC)    Atrial flutter (HCC)    Coronary artery disease    a. 06/2014 NSTEMI s/p CABG x 4 (LIMA to LAD, VG to Diag, VG to OM, VG to PDA).   Depression    Diabetic neuropathy (HCC)    GERD (gastroesophageal reflux disease)    HTN (hypertension)    Hyperlipidemia LDL goal <70    IDDM (insulin dependent diabetes mellitus)    Ischemic cardiomyopathy    a. 06/2014 Echo: EF 40-45%, HK of entire inferolateral and inferior myocardium c/w infarct of RCA/LCx, GR2DD, mild MR   Myocardial infarction ([place]) 2016   OA (osteoarthritis) of knee    Obesity    Osteomyelitis ([place])    Peripheral neuropathy    Peripheral vascular disease ([place])    a. 09/2016 Periph Angio: CTO R popliteal, CTO L prox/mid SFA->Med Rx; b. 05/2017 PTA: LSFA (Viabahn covered stent x 2), DEB to L Post Tibial; c. 06/2017 ABI: R 0.44, L 1.05.   Tobacco abuse    Past Surgical History:  Procedure Laterality Date   ABDOMINAL AORTOGRAM W/LOWER EXTREMITY N/A 10/27/2016   Procedure: Abdominal Aortogram w/Lower Extremity;  Surgeon: Nelva Bush, MD;  Location: [facility];  Service: Cardiovascular;  Laterality: N/A;   AMPUTATION TOE Left 10/21/2015   Procedure: AMPUTATION TOE;  Surgeon: Sharlotte Alamo, DPM;  Location: [facility];  Service: Podiatry;  Laterality: Left;   AMPUTATION TOE Left 05/15/2017   Procedure: AMPUTATION LEFT GREAT TOE;  Surgeon: Sharlotte Alamo, DPM;  Location: [facility];  Service: Podiatry;  Laterality: Left;   AORTIC VALVE REPLACEMENT (AVR)/CORONARY ARTERY BYPASS GRAFTING (CABG)     BREAST BIOPSY     CARDIAC CATHETERIZATION  06/2014   95% stenosis mLAD, occlusion ostial OM1, 70% stenosis LCx, 95% stenosis mRCA, EF 45%.   CARDIOVERSION N/A 04/14/2020   Procedure: CARDIOVERSION with TEE;  Surgeon: Minna Merritts, MD;  Location: [facility];  Service: Cardiovascular;  Laterality: N/A;   CARDIOVERSION N/A 05/28/2020   Procedure: CARDIOVERSION;  Surgeon: Minna Merritts, MD;  Location:  [facility];  Service: Cardiovascular;  Laterality: N/A;   CORONARY ARTERY BYPASS GRAFT N/A 06/08/2014   Procedure: CORONARY ARTERY BYPASS GRAFTING (CABG);  Surgeon: Grace Isaac, MD;  Location:  [facility];  Service: Open Heart Surgery;  Laterality: N/A;  Times 4 using left internal mammary artery to LAD artery and endoscopically harvested bilateral saphenous vein to Obtuse Marginal, Diagonal and Posterior Descending coronary arteries.   CT ABD W & PELVIS WO CM  06/2014   nl liver, gallbladder, spleen, mild diverticular changes, no bowel wall inflammation, appendix nl, no hernia, no other sig abnormalities   GASTRIC ROUX-EN-Y N/A 02/09/2020   Procedure: LAPAROSCOPIC ROUX-EN-Y GASTRIC BYPASS WITH UPPER ENDOSCOPY;  Surgeon: Johnathan Hausen, MD;  Location: [facility];  Service: General;  Laterality: N/A;   LOWER EXTREMITY ANGIOGRAPHY Left 05/23/2017   Procedure: LOWER EXTREMITY ANGIOGRAPHY;  Surgeon: Algernon Huxley, MD;  Location: [facility];  Service: Cardiovascular;  Laterality: Left;   LOWER EXTREMITY ANGIOGRAPHY Left 05/28/2017   Procedure: LOWER EXTREMITY ANGIOGRAPHY;  Surgeon: Algernon Huxley, MD;  Location: [facility];  Service: Cardiovascular;  Laterality: Left;   LOWER EXTREMITY ANGIOGRAPHY Left 12/16/2018   Procedure: LOWER EXTREMITY ANGIOGRAPHY;  Surgeon: Algernon Huxley, MD;  Location: [facility];  Service: Cardiovascular;  Laterality: Left;   LOWER EXTREMITY ANGIOGRAPHY Right 12/23/2018   Procedure: LOWER EXTREMITY ANGIOGRAPHY;  Surgeon: Algernon Huxley, MD;  Location: [facility];  Service: Cardiovascular;  Laterality: Right;   TEE WITHOUT CARDIOVERSION N/A 06/08/2014   Procedure: TRANSESOPHAGEAL ECHOCARDIOGRAM (TEE);  Surgeon: Grace Isaac, MD;  Location: [facility];  Service: Open Heart Surgery;  Laterality: N/A;   TEE WITHOUT CARDIOVERSION N/A 04/14/2020   Procedure: TRANSESOPHAGEAL ECHOCARDIOGRAM (TEE);  Surgeon: Minna Merritts, MD;  Location: [facility];  Service:  Cardiovascular;  Laterality: N/A;   TOE AMPUTATION Right 12/2011   rt middle toe   UPPER GI ENDOSCOPY N/A 02/09/2020   Procedure: UPPER GI ENDOSCOPY;  Surgeon: Johnathan Hausen, MD;  Location: [facility];  Service: General;  Laterality: N/A;   US ECHOCARDIOGRAPHY  06/2014   EF 50-55%, HK of inf/post/inferolat walls, Ao sclerosis   Social History Social History   Tobacco Use   Smoking status: Former    Packs/day: 0.50    Years: 40.00    Pack years: 20.00    Types: Cigarettes   Smokeless tobacco: Never   Tobacco comments:    started Chantix 04/12/20  Vaping Use   Vaping Use: Never used  Substance Use Topics   Alcohol use: No    Alcohol/week: 0.0 standard drinks   Drug use: Not Currently    Comment: crack   Family History Family History  Problem Relation Age of Onset   CAD Mother    Hypertension Mother    Diabetes Mother    CAD Father    Diabetes Father    Sickle cell trait Other    Asthma Other   Denies family history of peripheral artery disease, venous disease or renal disease.  No Known Allergies  REVIEW OF SYSTEMS (Negative unless checked)  Constitutional: [] Weight loss  [] Fever  [] Chills Cardiac: [] Chest pain   [] Chest pressure   [] Palpitations   [] Shortness of breath when laying flat   [] Shortness of breath at rest   [] Shortness of breath with exertion. Vascular:  [x] Pain in legs with walking   [x] Pain in legs at rest   [x] Pain in legs when laying flat   [] Claudication   [] Pain in feet when walking  [] Pain in feet at rest  [] Pain in feet when laying flat   [] History of DVT   [] Phlebitis   [] Swelling in legs   [] Varicose  veins   [] Non-healing ulcers Pulmonary:   [] Uses home oxygen   [] Productive cough   [] Hemoptysis   [] Wheeze  [] COPD   [] Asthma Neurologic:  [] Dizziness  [] Blackouts   [] Seizures   [] History of stroke   [] History of TIA  [] Aphasia   [] Temporary blindness   [] Dysphagia   [] Weakness or numbness in arms   [] Weakness or numbness in legs Musculoskeletal:   [] Arthritis   [] Joint swelling   [] Joint pain   [] Low back pain Hematologic:  [] Easy bruising  [] Easy bleeding   [] Hypercoagulable state   [] Anemic  [] Hepatitis Gastrointestinal:  [] Blood in stool   [] Vomiting blood  [] Gastroesophageal reflux/heartburn   [] Difficulty swallowing. Genitourinary:  [] Chronic kidney disease   [] Difficult urination  [] Frequent urination  [] Burning with urination   [] Blood in urine Skin:  [] Rashes   Ulcers   [x] Wounds Psychological:  [] History of anxiety   []  History of major depression.  Physical Examination  Vitals:   10/20/20 0900 10/20/20 0905 10/20/20 1150 10/20/20 1533  BP:   126/76 120/75  Pulse:   (!) 101 100  Resp:   18 18  Temp:   98 F (36.7 C) 98.1 F (36.7 C)  TempSrc:    Oral  SpO2: (!) 87% 95% 92% 100%  Weight:      Height:       Body mass index is 37.1 kg/m. Gen:  WD/WN, NAD Head: [place]/AT, No temporalis wasting. Prominent temp pulse not noted. Ear/Nose/Throat: Hearing grossly intact, nares w/o erythema or drainage, oropharynx w/o Erythema/Exudate Eyes: Sclera non-icteric, conjunctiva clear Neck: Trachea midline.  No JVD.  Pulmonary:  Good air movement, respirations not labored, equal bilaterally.  Cardiac: RRR, normal S1, S2. Vascular:  Vessel Right Left  Radial Palpable Palpable  Ulnar Palpable Palpable  Brachial Palpable Palpable  Carotid Palpable, without bruit Palpable, without bruit  Aorta Not palpable N/A  Femoral Palpable Palpable  Popliteal Palpable Palpable  PT Non-Palpable Non-Palpable  DP Non-Palpable Non-Palpable   Right Lower Extremity: Thigh soft, calf soft. Foul oder noted. Necrotic heel wound. Lateral ankle wound.   Gastrointestinal: soft, non-tender/non-distended. No guarding/reflex.  Musculoskeletal: M/S 5/5 throughout.  Extremities without ischemic changes.  No deformity or atrophy. No edema. Neurologic: Sensation grossly intact in extremities.  Symmetrical.  Speech is fluent. Motor exam as listed  above. Psychiatric: Judgment intact, Mood & affect appropriate for pt's clinical situation. Dermatologic: As above. Lymph : No Cervical, Axillary, or Inguinal lymphadenopathy.  CBC Lab Results  Component Value Date   WBC 15.0 (H) 10/20/2020   HGB 9.7 (L) 10/20/2020   HCT 29.0 (L) 10/20/2020   MCV 76.7 (L) 10/20/2020   PLT 342 10/20/2020   BMET    Component Value Date/Time   NA 139 10/20/2020 0930   NA 139 10/10/2016 1911   NA 140 06/03/2014 0405   K 3.2 (L) 10/20/2020 0930   K 4.1 06/03/2014 0405   CL 100 10/20/2020 0930   CL 107 06/03/2014 0405   CO2 31 10/20/2020 0930   CO2 26 06/03/2014 0405   GLUCOSE 158 (H) 10/20/2020 0930   GLUCOSE 227 (H) 06/03/2014 0405   BUN 12 10/20/2020 0930   BUN 16 10/10/2016 1911   BUN 13 06/03/2014 0405   CREATININE 1.10 (H) 10/20/2020 0930   CREATININE 1.13 06/03/2014 0405   CALCIUM 7.9 (L) 10/20/2020 0930   CALCIUM 8.1 (L) 06/03/2014 0405   GFRNONAA 55 (L) 10/20/2020 0930   GFRNONAA 52 (L) 06/03/2014 0405   GFRNONAA 54 (L) 11/28/2013 1754  GFRAA 51 (L) 02/02/2020 1110   GFRAA >60 06/03/2014 0405   GFRAA >60 11/28/2013 1754   Estimated Creatinine Clearance: 64.7 mL/min (A) (by C-G formula based on SCr of 1.1 mg/dL (H)).  COAG Lab Results  Component Value Date   INR 1.9 (H) 10/20/2020   INR 2.0 (H) 10/18/2020   INR 1.03 05/14/2017   Radiology DG Chest 2 View  Result Date: 10/07/2020 CLINICAL DATA:  Chest pain and right leg swelling. EXAM: CHEST - 2 VIEW COMPARISON:  August 22, 2020 FINDINGS: The heart size and mediastinal contours are stable. Heart size enlarged. Mild atelectasis is identified in bilateral lung bases. There is no pulmonary edema or pleural effusion. The visualized skeletal structures are unremarkable. IMPRESSION: No active cardiopulmonary disease. Electronically Signed   By: Abelardo Diesel M.D.   On: 10/07/2020 19:24   DG Os Calcis Right  Result Date: 10/04/2020 CLINICAL DATA:  Open wound heel EXAM: RIGHT OS  CALCIS - 2+ VIEW COMPARISON:  02/18/2019 FINDINGS: Negative for fracture or osteomyelitis. Mild calcaneal spurring.  Mild arterial calcification. IMPRESSION: No acute skeletal abnormality. Electronically Signed   By: Franchot Gallo M.D.   On: 10/04/2020 16:24   MR ANKLE RIGHT WO CONTRAST  Result Date: 10/20/2020 CLINICAL DATA:  Osteomyelitis suspected, ankle, xray done Necrotic heel ulcer. Suspect osteomyelitis. EXAM: MRI OF THE RIGHT ANKLE WITHOUT CONTRAST TECHNIQUE: Multiplanar, multisequence MR imaging of the ankle was performed. No intravenous contrast was administered. COMPARISON:  Radiograph 10/10/2020 FINDINGS: TENDONS Peroneal: There is focal mild tenosynovitis of the peroneal longus and brevis tendons just below the lateral malleolus. There is intramuscular edema proximally. Posteromedial: Posterior tibial tendon intact. Flexor digitorum longus tendon intact. Flexor hallucis longus tendon intact. Anterior: Tibialis anterior tendon intact. Extensor hallucis longus tendon intact Extensor digitorum longus tendon intact. Achilles:  Intact. Plantar Fascia: Intact. LIGAMENTS Lateral: Anterior talofibular ligament intact. Calcaneofibular ligament intact. Posterior talofibular ligament intact. Anterior and posterior tibiofibular ligaments intact. Medial: Deltoid ligament intact. Spring ligament intact. CARTILAGE Ankle Joint: No joint effusion. Normal ankle mortise. No chondral defect. There is mild midfoot arthritis. Subtalar Joints/Sinus Tarsi: Normal subtalar joints. No subtalar joint effusion. Normal sinus tarsi. Bones: There is bony edema in some associated low T1 signal along the lateral inferior calcaneus in likely cortical disruption. This is underlying a soft tissue ulcer with surrounding edema and subcutaneous gas. Soft Tissue: Lateral hindfoot ulcer as above. There is no well-defined soft tissue abscess. Extensive intramuscular and subcutaneous edema in the foot. IMPRESSION: Lateral hindfoot ulcer  with underlying osteomyelitis of the inferolateral calcaneus, subcutaneous soft tissue gas and edema. No no well-defined soft tissue abscess. Focal mild tenosynovitis of the peroneal longus and brevis tendons just below the lateral malleolus. Electronically Signed   By: Maurine Simmering   On: 10/20/2020 07:19   US Venous Img Lower Unilateral Right  Result Date: 10/07/2020 CLINICAL DATA:  Right leg swelling EXAM: RIGHT LOWER EXTREMITY VENOUS DOPPLER ULTRASOUND TECHNIQUE: Gray-scale sonography with compression, as well as color and duplex ultrasound, were performed to evaluate the deep venous system(s) from the level of the common femoral vein through the popliteal and proximal calf veins. COMPARISON:  None. FINDINGS: VENOUS Normal compressibility of the common femoral, superficial femoral, and popliteal veins, as well as the visualized calf veins. Visualized portions of profunda femoral vein and great saphenous vein unremarkable. No filling defects to suggest DVT on grayscale or color Doppler imaging. Doppler waveforms show normal direction of venous flow, normal respiratory plasticity and response to augmentation. Limited views  of the contralateral common femoral vein are unremarkable. OTHER Markedly biphasic venous waveform within the common femoral veins bilaterally suggests right heart failure or tricuspid regurgitation. Limitations: none IMPRESSION: No femoropopliteal DVT within the right lower extremity. Biphasic venous waveform within the common femoral vein suggesting right heart failure and/or tricuspid regurgitation. Electronically Signed   By: Fidela Salisbury MD   On: 10/07/2020 21:01   DG Chest Port 1 View  Result Date: 10/20/2020 CLINICAL DATA:  Hypoxia. EXAM: PORTABLE CHEST 1 VIEW COMPARISON:  10/07/2020. FINDINGS: Prior CABG. Cardiomegaly. Diffuse bilateral pulmonary infiltrates/edema. No pleural effusion or pneumothorax. Degenerative change thoracic spine. IMPRESSION: 1.  Prior CABG.  Cardiomegaly.  2.  Diffuse bilateral pulmonary infiltrates/edema. Electronically Signed   By: Marcello Moores  Register   On: 10/20/2020 10:26   DG HIP UNILAT WITH PELVIS 2-3 VIEWS LEFT  Result Date: 09/25/2020 CLINICAL DATA:  Fall, left hip pain EXAM: DG HIP (WITH OR WITHOUT PELVIS) 2-3V LEFT COMPARISON:  None. FINDINGS: No fracture or dislocation is seen. The joint spaces are preserved. Visualized bony pelvis appears intact. Degenerative changes of the lower lumbar spine. Left groin stents. IMPRESSION: No fracture or dislocation is seen. Electronically Signed   By: Julian Hy M.D.   On: 09/25/2020 06:14    Assessment/Plan Melinda [name] is a 65 year old [name] with medical history significant for CKD 3B, insulin-dependent diabetes mellitus, anxiety, depression, hypertension, osteoarthritis, peripheral artery disease, sickle cell trait, tobacco dependency, status post left first toe amputation, right toe amputation, who presents to the emergency department for chief concerns of right heel wound.  1.  Atherosclerotic disease with chronic gangrenous: Patient with known history of atherosclerotic disease to the bilateral lower extremity.  Patient presents with known history of atherosclerotic disease and chronic wounds to the right heel with gangrenous changes.  Recommend the patient undergo a repeat angiogram with possible intervention and attempt assess her anatomy and contributing degree of atherosclerotic disease.  If appropriate an attempt to revascularize the leg can be made at that time.  We will plan on this on Thursday with Dr. Lucky Cowboy.  Procedure, risks and benefits were explained to the patient.  All questions were answered.  Patient wishes to proceed.  2.  Diabetes: On appropriate medications. Encouraged good control as its slows the progression of atherosclerotic disease   3.  Hyperlipidemia: On Statin for medical management. Also on Eliquis. Encouraged good control as its slows the progression of  atherosclerotic disease   4.  Tobacco abuse: We had a discussion for approximately three minutes regarding the absolute need for smoking cessation due to the deleterious nature of tobacco on the vascular system. We discussed the tobacco use would diminish patency of any intervention, and likely significantly worsen progression of disease. We discussed multiple agents for quitting including replacement therapy or medications to reduce cravings such as Chantix. The patient voices their understanding of the importance of smoking cessation.   Discussed with Dr. Mayme Genta, [name]  10/20/2020 4:16 PM  This note was created with Dragon medical transcription system.  Any error is purely unintentional.

## 2020-10-21 ENCOUNTER — Inpatient Hospital Stay: Payer: Medicare HMO

## 2020-10-21 ENCOUNTER — Ambulatory Visit: Payer: Medicare HMO | Admitting: Podiatry

## 2020-10-21 ENCOUNTER — Encounter: Admission: EM | Disposition: E | Payer: Self-pay | Source: Home / Self Care | Attending: Internal Medicine

## 2020-10-21 ENCOUNTER — Encounter: Payer: Self-pay | Admitting: Obstetrics and Gynecology

## 2020-10-21 ENCOUNTER — Inpatient Hospital Stay: Payer: Medicare HMO | Admitting: Anesthesiology

## 2020-10-21 DIAGNOSIS — J9601 Acute respiratory failure with hypoxia: Secondary | ICD-10-CM

## 2020-10-21 DIAGNOSIS — M86171 Other acute osteomyelitis, right ankle and foot: Secondary | ICD-10-CM

## 2020-10-21 DIAGNOSIS — A419 Sepsis, unspecified organism: Principal | ICD-10-CM

## 2020-10-21 DIAGNOSIS — L97412 Non-pressure chronic ulcer of right heel and midfoot with fat layer exposed: Secondary | ICD-10-CM

## 2020-10-21 DIAGNOSIS — I4891 Unspecified atrial fibrillation: Secondary | ICD-10-CM

## 2020-10-21 DIAGNOSIS — E1169 Type 2 diabetes mellitus with other specified complication: Secondary | ICD-10-CM

## 2020-10-21 DIAGNOSIS — I480 Paroxysmal atrial fibrillation: Secondary | ICD-10-CM

## 2020-10-21 DIAGNOSIS — I70234 Atherosclerosis of native arteries of right leg with ulceration of heel and midfoot: Secondary | ICD-10-CM

## 2020-10-21 DIAGNOSIS — L089 Local infection of the skin and subcutaneous tissue, unspecified: Secondary | ICD-10-CM

## 2020-10-21 DIAGNOSIS — L97419 Non-pressure chronic ulcer of right heel and midfoot with unspecified severity: Secondary | ICD-10-CM

## 2020-10-21 HISTORY — PX: LOWER EXTREMITY ANGIOGRAPHY: CATH118251

## 2020-10-21 LAB — BLOOD GAS, ARTERIAL
Acid-base deficit: 10.8 mmol/L — ABNORMAL HIGH (ref 0.0–2.0)
Acid-base deficit: 8.7 mmol/L — ABNORMAL HIGH (ref 0.0–2.0)
Bicarbonate: 16.8 mmol/L — ABNORMAL LOW (ref 20.0–28.0)
Bicarbonate: 16.3 mmol/L — ABNORMAL LOW (ref 20.0–28.0)
FIO2: 1
FIO2: 1
MECHVT: 500 mL
MECHVT: 500 mL
Mechanical Rate: 26
O2 Saturation: 85.3 %
O2 Saturation: 98.9 %
PEEP: 10 cmH2O
PEEP: 10 cmH2O
Patient temperature: 37
Patient temperature: 37
pCO2 arterial: 44 mmHg (ref 32.0–48.0)
pCO2 arterial: 31 mmHg — ABNORMAL LOW (ref 32.0–48.0)
pH, Arterial: 7.19 — CL (ref 7.350–7.450)
pH, Arterial: 7.33 — ABNORMAL LOW (ref 7.350–7.450)
pO2, Arterial: 63 mmHg — ABNORMAL LOW (ref 83.0–108.0)
pO2, Arterial: 137 mmHg — ABNORMAL HIGH (ref 83.0–108.0)

## 2020-10-21 LAB — BASIC METABOLIC PANEL
Anion gap: 6 (ref 5–15)
Anion gap: 17 — ABNORMAL HIGH (ref 5–15)
BUN: 14 mg/dL (ref 8–23)
BUN: 18 mg/dL (ref 8–23)
CO2: 32 mmol/L (ref 22–32)
CO2: 17 mmol/L — ABNORMAL LOW (ref 22–32)
Calcium: 8.1 mg/dL — ABNORMAL LOW (ref 8.9–10.3)
Calcium: 7.8 mg/dL — ABNORMAL LOW (ref 8.9–10.3)
Chloride: 105 mmol/L (ref 98–111)
Chloride: 103 mmol/L (ref 98–111)
Creatinine, Ser: 1.16 mg/dL — ABNORMAL HIGH (ref 0.44–1.00)
Creatinine, Ser: 1.51 mg/dL — ABNORMAL HIGH (ref 0.44–1.00)
GFR, Estimated: 52 mL/min — ABNORMAL LOW (ref >60)
GFR, Estimated: 38 mL/min — ABNORMAL LOW (ref >60)
Glucose, Bld: 134 mg/dL — ABNORMAL HIGH (ref 70–99)
Glucose, Bld: 218 mg/dL — ABNORMAL HIGH (ref 70–99)
Potassium: 3.7 mmol/L (ref 3.5–5.1)
Potassium: 4.7 mmol/L (ref 3.5–5.1)
Sodium: 143 mmol/L (ref 135–145)
Sodium: 137 mmol/L (ref 135–145)

## 2020-10-21 LAB — GLUCOSE, CAPILLARY
Glucose-Capillary: 126 mg/dL — ABNORMAL HIGH (ref 70–99)
Glucose-Capillary: 152 mg/dL — ABNORMAL HIGH (ref 70–99)
Glucose-Capillary: 217 mg/dL — ABNORMAL HIGH (ref 70–99)
Glucose-Capillary: 212 mg/dL — ABNORMAL HIGH (ref 70–99)
Glucose-Capillary: 197 mg/dL — ABNORMAL HIGH (ref 70–99)
Glucose-Capillary: 163 mg/dL — ABNORMAL HIGH (ref 70–99)

## 2020-10-21 LAB — LACTIC ACID, PLASMA: Lactic Acid, Venous: >11 mmol/L (ref 0.5–1.9)

## 2020-10-21 LAB — APTT
aPTT: 51 seconds — ABNORMAL HIGH (ref 24–36)
aPTT: 49 seconds — ABNORMAL HIGH (ref 24–36)

## 2020-10-21 LAB — CBC
HCT: 27.6 % — ABNORMAL LOW (ref 36.0–46.0)
HCT: 27.5 % — ABNORMAL LOW (ref 36.0–46.0)
Hemoglobin: 9.5 g/dL — ABNORMAL LOW (ref 12.0–15.0)
Hemoglobin: 9 g/dL — ABNORMAL LOW (ref 12.0–15.0)
MCH: 25.7 pg — ABNORMAL LOW (ref 26.0–34.0)
MCH: 25.9 pg — ABNORMAL LOW (ref 26.0–34.0)
MCHC: 34.4 g/dL (ref 30.0–36.0)
MCHC: 32.7 g/dL (ref 30.0–36.0)
MCV: 74.8 fL — ABNORMAL LOW (ref 80.0–100.0)
MCV: 79 fL — ABNORMAL LOW (ref 80.0–100.0)
Platelets: 340 10*3/uL (ref 150–400)
Platelets: 337 10*3/uL (ref 150–400)
RBC: 3.69 MIL/uL — ABNORMAL LOW (ref 3.87–5.11)
RBC: 3.48 MIL/uL — ABNORMAL LOW (ref 3.87–5.11)
RDW: 19.5 % — ABNORMAL HIGH (ref 11.5–15.5)
RDW: 20.3 % — ABNORMAL HIGH (ref 11.5–15.5)
WBC: 13.6 10*3/uL — ABNORMAL HIGH (ref 4.0–10.5)
WBC: 26.1 10*3/uL — ABNORMAL HIGH (ref 4.0–10.5)
nRBC: 0 % (ref 0.0–0.2)
nRBC: 0.5 % — ABNORMAL HIGH (ref 0.0–0.2)

## 2020-10-21 LAB — MAGNESIUM
Magnesium: 2 mg/dL (ref 1.7–2.4)
Magnesium: 2.4 mg/dL (ref 1.7–2.4)

## 2020-10-21 LAB — PROTIME-INR
INR: 1.9 — ABNORMAL HIGH (ref 0.8–1.2)
Prothrombin Time: 22 seconds — ABNORMAL HIGH (ref 11.4–15.2)

## 2020-10-21 LAB — TROPONIN I (HIGH SENSITIVITY): Troponin I (High Sensitivity): 17 ng/L (ref <18)

## 2020-10-21 SURGERY — LOWER EXTREMITY ANGIOGRAPHY
Anesthesia: General | Laterality: Right

## 2020-10-21 MED ORDER — ONDANSETRON HCL 4 MG/2ML IJ SOLN
INTRAMUSCULAR | Status: DC | PRN
  Administered 2020-10-21: 4 mg via INTRAVENOUS

## 2020-10-21 MED ORDER — NOREPINEPHRINE 4 MG/250ML-% IV SOLN
INTRAVENOUS | Status: AC
  Administered 2020-10-21: 2 ug/min via INTRAVENOUS
  Filled 2020-10-21: qty 250

## 2020-10-21 MED ORDER — FENTANYL CITRATE (PF) 100 MCG/2ML IJ SOLN
INTRAMUSCULAR | Status: AC
  Filled 2020-10-21: qty 2

## 2020-10-21 MED ORDER — ACETAMINOPHEN 325 MG PO TABS: 650.0000 mg | ORAL_TABLET | ORAL | Status: DC

## 2020-10-21 MED ORDER — HEPARIN BOLUS VIA INFUSION
2600.0000 [IU] | Freq: Once | INTRAVENOUS | Status: AC
  Administered 2020-10-21: 2600 [IU] via INTRAVENOUS
  Filled 2020-10-21: qty 2600

## 2020-10-21 MED ORDER — PHENYLEPHRINE HCL (PRESSORS) 10 MG/ML IV SOLN
INTRAVENOUS | Status: DC | PRN
  Administered 2020-10-21 (×2): 100 ug via INTRAVENOUS
  Administered 2020-10-21: 150 ug via INTRAVENOUS

## 2020-10-21 MED ORDER — SODIUM CHLORIDE 0.9 % IV SOLN
3.0000 g | Freq: Four times a day (QID) | INTRAVENOUS | Status: DC
  Administered 2020-10-22 – 2020-10-24 (×9): 3 g via INTRAVENOUS
  Filled 2020-10-21 (×2): qty 3
  Filled 2020-10-21 (×3): qty 8
  Filled 2020-10-21: qty 3
  Filled 2020-10-21 (×2): qty 8
  Filled 2020-10-21: qty 3
  Filled 2020-10-21 (×3): qty 8
  Filled 2020-10-21 (×3): qty 3

## 2020-10-21 MED ORDER — FENTANYL CITRATE (PF) 100 MCG/2ML IJ SOLN: 25.0000 ug | INTRAMUSCULAR | Status: DC | PRN

## 2020-10-21 MED ORDER — NOREPINEPHRINE 4 MG/250ML-% IV SOLN
0.0000 ug/min | INTRAVENOUS | Status: DC
  Administered 2020-10-24: 6 ug/min via INTRAVENOUS
  Administered 2020-10-24 – 2020-10-25 (×2): 12 ug/min via INTRAVENOUS
  Filled 2020-10-21 (×5): qty 250

## 2020-10-21 MED ORDER — DEXAMETHASONE SODIUM PHOSPHATE 10 MG/ML IJ SOLN
INTRAMUSCULAR | Status: AC
  Filled 2020-10-21: qty 1

## 2020-10-21 MED ORDER — AMIODARONE LOAD VIA INFUSION
150.0000 mg | Freq: Once | INTRAVENOUS | Status: AC
  Filled 2020-10-21: qty 83.34

## 2020-10-21 MED ORDER — DEXAMETHASONE SODIUM PHOSPHATE 10 MG/ML IJ SOLN
INTRAMUSCULAR | Status: DC | PRN
  Administered 2020-10-21: 4 mg via INTRAVENOUS

## 2020-10-21 MED ORDER — SUCCINYLCHOLINE CHLORIDE 20 MG/ML IJ SOLN
INTRAMUSCULAR | Status: DC | PRN
  Administered 2020-10-21: 120 mg via INTRAVENOUS

## 2020-10-21 MED ORDER — AMIODARONE IV BOLUS ONLY 150 MG/100ML
INTRAVENOUS | Status: AC
  Filled 2020-10-21: qty 100

## 2020-10-21 MED ORDER — MIDAZOLAM HCL 2 MG/ML PO SYRP
8.0000 mg | ORAL_SOLUTION | Freq: Once | ORAL | Status: DC | PRN
  Filled 2020-10-21: qty 4

## 2020-10-21 MED ORDER — LIDOCAINE HCL (CARDIAC) PF 100 MG/5ML IV SOSY
PREFILLED_SYRINGE | INTRAVENOUS | Status: DC | PRN
  Administered 2020-10-21: 100 mg via INTRAVENOUS

## 2020-10-21 MED ORDER — MEPERIDINE HCL 25 MG/ML IJ SOLN: 6.2500 mg | INTRAMUSCULAR | Status: DC | PRN

## 2020-10-21 MED ORDER — HYDROMORPHONE HCL 1 MG/ML IJ SOLN
1.0000 mg | Freq: Once | INTRAMUSCULAR | Status: AC | PRN
  Administered 2020-10-23: 1 mg via INTRAVENOUS
  Filled 2020-10-21: qty 1

## 2020-10-21 MED ORDER — SODIUM CHLORIDE 0.9 % IV SOLN: INTRAVENOUS | Status: DC

## 2020-10-21 MED ORDER — FUROSEMIDE 10 MG/ML IJ SOLN
80.0000 mg | Freq: Once | INTRAMUSCULAR | Status: AC
  Administered 2020-10-21: 80 mg via INTRAVENOUS

## 2020-10-21 MED ORDER — FAMOTIDINE 20 MG PO TABS: 40.0000 mg | ORAL_TABLET | Freq: Once | ORAL | Status: DC | PRN

## 2020-10-21 MED ORDER — ONDANSETRON HCL 4 MG/2ML IJ SOLN: 4.0000 mg | Freq: Four times a day (QID) | INTRAMUSCULAR | Status: DC | PRN

## 2020-10-21 MED ORDER — CLOPIDOGREL BISULFATE 75 MG PO TABS
75.0000 mg | ORAL_TABLET | Freq: Every day | ORAL | Status: DC
  Administered 2020-10-21: 75 mg via ORAL
  Filled 2020-10-21: qty 1

## 2020-10-21 MED ORDER — CHLORHEXIDINE GLUCONATE 4 % EX LIQD: 60.0000 mL | Freq: Once | CUTANEOUS | Status: DC

## 2020-10-21 MED ORDER — LIDOCAINE HCL (PF) 2 % IJ SOLN
INTRAMUSCULAR | Status: AC
  Filled 2020-10-21: qty 6

## 2020-10-21 MED ORDER — ONDANSETRON HCL 4 MG/2ML IJ SOLN
INTRAMUSCULAR | Status: AC
  Filled 2020-10-21: qty 2

## 2020-10-21 MED ORDER — IPRATROPIUM-ALBUTEROL 0.5-2.5 (3) MG/3ML IN SOLN
RESPIRATORY_TRACT | Status: AC
  Administered 2020-10-21: 3 mL via RESPIRATORY_TRACT
  Filled 2020-10-21: qty 3

## 2020-10-21 MED ORDER — HEPARIN SODIUM (PORCINE) 1000 UNIT/ML IJ SOLN
INTRAMUSCULAR | Status: DC | PRN
  Administered 2020-10-21: 5000 [IU] via INTRAVENOUS

## 2020-10-21 MED ORDER — ACETAMINOPHEN 160 MG/5ML PO SOLN
650.0000 mg | ORAL | Status: DC
  Filled 2020-10-21 (×6): qty 20.3

## 2020-10-21 MED ORDER — AMIODARONE HCL IN DEXTROSE 360-4.14 MG/200ML-% IV SOLN
60.0000 mg/h | INTRAVENOUS | Status: AC
  Administered 2020-10-21 (×2): 60 mg/h via INTRAVENOUS

## 2020-10-21 MED ORDER — HEPARIN (PORCINE) 25000 UT/250ML-% IV SOLN
1700.0000 [IU]/h | INTRAVENOUS | Status: DC
  Administered 2020-10-22: 1700 [IU]/h via INTRAVENOUS
  Filled 2020-10-21: qty 250

## 2020-10-21 MED ORDER — CHLORHEXIDINE GLUCONATE CLOTH 2 % EX PADS
6.0000 | MEDICATED_PAD | Freq: Every day | CUTANEOUS | Status: DC
  Administered 2020-10-21 – 2020-10-25 (×5): 6 via TOPICAL

## 2020-10-21 MED ORDER — IODIXANOL 320 MG/ML IV SOLN
INTRAVENOUS | Status: DC | PRN
  Administered 2020-10-21: 50 mL via INTRA_ARTERIAL

## 2020-10-21 MED ORDER — SUCCINYLCHOLINE CHLORIDE 200 MG/10ML IV SOSY
PREFILLED_SYRINGE | INTRAVENOUS | Status: AC
  Filled 2020-10-21: qty 10

## 2020-10-21 MED ORDER — PROPOFOL 10 MG/ML IV BOLUS
INTRAVENOUS | Status: AC
  Filled 2020-10-21: qty 20

## 2020-10-21 MED ORDER — SODIUM CHLORIDE 0.9 % IV SOLN
INTRAVENOUS | Status: AC
  Filled 2020-10-21: qty 10

## 2020-10-21 MED ORDER — ASPIRIN EC 81 MG PO TBEC
81.0000 mg | DELAYED_RELEASE_TABLET | Freq: Every day | ORAL | Status: DC
  Administered 2020-10-21: 81 mg via ORAL
  Filled 2020-10-21: qty 1

## 2020-10-21 MED ORDER — CEFAZOLIN SODIUM-DEXTROSE 1-4 GM/50ML-% IV SOLN
1.0000 g | Freq: Once | INTRAVENOUS | Status: AC
  Administered 2020-10-21: 2 g via INTRAVENOUS
  Filled 2020-10-21: qty 50

## 2020-10-21 MED ORDER — FENTANYL CITRATE (PF) 100 MCG/2ML IJ SOLN
INTRAMUSCULAR | Status: DC | PRN
  Administered 2020-10-21 (×2): 50 ug via INTRAVENOUS

## 2020-10-21 MED ORDER — AMIODARONE HCL IN DEXTROSE 360-4.14 MG/200ML-% IV SOLN
30.0000 mg/h | INTRAVENOUS | Status: DC
  Administered 2020-10-22 – 2020-10-26 (×9): 30 mg/h via INTRAVENOUS
  Filled 2020-10-21 (×10): qty 200

## 2020-10-21 MED ORDER — POVIDONE-IODINE 10 % EX SWAB: 2.0000 "application " | Freq: Once | CUTANEOUS | Status: DC

## 2020-10-21 MED ORDER — HEPARIN BOLUS VIA INFUSION
4000.0000 [IU] | Freq: Once | INTRAVENOUS | Status: AC
  Administered 2020-10-22: 4000 [IU] via INTRAVENOUS
  Filled 2020-10-21: qty 4000

## 2020-10-21 MED ORDER — AMIODARONE HCL IN DEXTROSE 360-4.14 MG/200ML-% IV SOLN
INTRAVENOUS | Status: AC
  Administered 2020-10-21: 150 mg via INTRAVENOUS
  Filled 2020-10-21: qty 200

## 2020-10-21 MED ORDER — DIPHENHYDRAMINE HCL 50 MG/ML IJ SOLN: 50.0000 mg | Freq: Once | INTRAMUSCULAR | Status: DC | PRN

## 2020-10-21 MED ORDER — ROCURONIUM BROMIDE 10 MG/ML (PF) SYRINGE
PREFILLED_SYRINGE | INTRAVENOUS | Status: AC
  Filled 2020-10-21: qty 10

## 2020-10-21 MED ORDER — FUROSEMIDE 10 MG/ML IJ SOLN: 80.0000 mg | Freq: Once | INTRAMUSCULAR | Status: DC

## 2020-10-21 MED ORDER — PANTOPRAZOLE SODIUM 40 MG IV SOLR
40.0000 mg | Freq: Every day | INTRAVENOUS | Status: DC
  Administered 2020-10-22 – 2020-10-25 (×5): 40 mg via INTRAVENOUS
  Filled 2020-10-21 (×5): qty 40

## 2020-10-21 MED ORDER — PROPOFOL 10 MG/ML IV BOLUS
INTRAVENOUS | Status: DC | PRN
  Administered 2020-10-21: 100 mg via INTRAVENOUS

## 2020-10-21 MED ORDER — IPRATROPIUM-ALBUTEROL 0.5-2.5 (3) MG/3ML IN SOLN: 3.0000 mL | Freq: Once | RESPIRATORY_TRACT | Status: AC

## 2020-10-21 MED ORDER — METHYLPREDNISOLONE SODIUM SUCC 125 MG IJ SOLR: 125.0000 mg | Freq: Once | INTRAMUSCULAR | Status: DC | PRN

## 2020-10-21 MED ORDER — ACETAMINOPHEN 650 MG RE SUPP
650.0000 mg | RECTAL | Status: DC
  Administered 2020-10-22: 650 mg via RECTAL
  Filled 2020-10-21: qty 1

## 2020-10-21 MED ORDER — EPINEPHRINE HCL 5 MG/250ML IV SOLN IN NS
0.5000 ug/min | INTRAVENOUS | Status: DC
  Administered 2020-10-21: 1 ug/min via INTRAVENOUS
  Filled 2020-10-21: qty 250

## 2020-10-21 SURGICAL SUPPLY — 23 items
BALLN LUTONIX 018 4X100X130 (BALLOONS) ×3
BALLN ULTRVRSE 018 2.5X150X150 (BALLOONS) ×3
BALLN ULTRVRSE 3X80X150 (BALLOONS) ×2
BALLN ULTRVRSE 3X80X150 OTW (BALLOONS) ×1
BALLOON LUTONIX 018 4X100X130 (BALLOONS) ×1 IMPLANT
BALLOON ULTRVRSE 3X80X150 OTW (BALLOONS) ×1 IMPLANT
BALLOON ULTRVS 018 2.5X150X150 (BALLOONS) ×1 IMPLANT
CATH ANGIO 5F PIGTAIL 65CM (CATHETERS) ×3 IMPLANT
CATH BEACON 5 .038 100 VERT TP (CATHETERS) ×3 IMPLANT
CATH ROTAREX 135 6FR (CATHETERS) ×3 IMPLANT
COVER PROBE U/S 5X48 (MISCELLANEOUS) ×3 IMPLANT
DEVICE STARCLOSE SE CLOSURE (Vascular Products) ×3 IMPLANT
GLIDEWIRE ADV .035X260CM (WIRE) ×3 IMPLANT
GUIDEWIRE PFTE-COATED .018X300 (WIRE) ×3 IMPLANT
KIT ENCORE 26 ADVANTAGE (KITS) ×3 IMPLANT
PACK ANGIOGRAPHY (CUSTOM PROCEDURE TRAY) ×3 IMPLANT
SHEATH ANL2 6FRX45 HC (SHEATH) ×3 IMPLANT
SHEATH BRITE TIP 5FRX11 (SHEATH) ×3 IMPLANT
SYR MEDRAD MARK 7 150ML (SYRINGE) ×3 IMPLANT
TOWEL OR 17X26 4PK STRL BLUE (TOWEL DISPOSABLE) ×3 IMPLANT
TUBING CONTRAST HIGH PRESS 48 (TUBING) ×6 IMPLANT
WIRE G V18X300CM (WIRE) ×3 IMPLANT
WIRE GUIDERIGHT .035X150 (WIRE) ×3 IMPLANT

## 2020-10-21 NOTE — Consult Note (Signed)
NAME: Melinda [name]  DOB: 1955/11/05  MRN: [id number]  Date/Time: 10/02/2020 5:19 PM  REQUESTING PROVIDER: Si Raider Subjective:  REASON FOR CONSULT: rt heel ulcer ? Melinda [name] is a 65 y.o. 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 06/2014 NSTEMI s/p CABG x 4 (LIMA to LAD, VG to Diag, VG to OM, VG to PDA).   Depression    Diabetic neuropathy ([facility])    GERD (gastroesophageal reflux disease)    HTN (hypertension)    Hyperlipidemia LDL goal <70    IDDM (insulin dependent diabetes mellitus)    Ischemic cardiomyopathy    a. 06/2014 Echo: EF 40-45%, HK of entire inferolateral and inferior myocardium c/w infarct of RCA/LCx, GR2DD, mild MR   Myocardial infarction ([place]) 2016   OA (osteoarthritis) of knee    Obesity    Osteomyelitis ([place])    Peripheral neuropathy    Peripheral vascular disease ([place])    a. 09/2016 Periph Angio: CTO R popliteal, CTO L prox/mid SFA->Med Rx; b. 05/2017 PTA: LSFA (Viabahn covered stent x 2), DEB to L Post Tibial; c. 06/2017 ABI: R 0.44, L 1.05.   Tobacco abuse     Past Surgical History:  Procedure Laterality Date   ABDOMINAL AORTOGRAM W/LOWER EXTREMITY N/A 10/27/2016   Procedure: Abdominal Aortogram w/Lower Extremity;  Surgeon: Nelva Bush, MD;  Location: [facility];  Service: Cardiovascular;  Laterality: N/A;   AMPUTATION TOE Left 10/21/2015    Procedure: AMPUTATION TOE;  Surgeon: Sharlotte Alamo, DPM;  Location: [facility];  Service: Podiatry;  Laterality: Left;   AMPUTATION TOE Left 05/15/2017   Procedure: AMPUTATION LEFT GREAT TOE;  Surgeon: Sharlotte Alamo, DPM;  Location: [facility];  Service: Podiatry;  Laterality: Left;   AORTIC VALVE REPLACEMENT (AVR)/CORONARY ARTERY BYPASS GRAFTING (CABG)     BREAST BIOPSY     CARDIAC CATHETERIZATION  06/2014   95% stenosis mLAD, occlusion ostial OM1, 70% stenosis LCx, 95% stenosis mRCA, EF 45%.   CARDIOVERSION N/A 04/14/2020   Procedure: CARDIOVERSION with TEE;  Surgeon: Minna Merritts, MD;  Location: [facility];  Service: Cardiovascular;  Laterality: N/A;   CARDIOVERSION N/A 05/28/2020   Procedure: CARDIOVERSION;  Surgeon: Minna Merritts, MD;  Location: [facility];  Service: Cardiovascular;  Laterality: N/A;   CORONARY ARTERY BYPASS GRAFT N/A 06/08/2014   Procedure: CORONARY ARTERY BYPASS GRAFTING (CABG);  Surgeon: Grace Isaac, MD;  Location: [facility];  Service: Open Heart Surgery;  Laterality: N/A;  Times 4 using left internal mammary artery to LAD artery and endoscopically harvested bilateral saphenous vein to Obtuse Marginal, Diagonal and Posterior Descending coronary arteries.   CT ABD W & PELVIS WO CM  06/2014   nl liver, gallbladder, spleen, mild diverticular changes, no bowel wall inflammation, appendix nl, no hernia, no other sig abnormalities   GASTRIC ROUX-EN-Y  N/A 02/09/2020   Procedure: LAPAROSCOPIC ROUX-EN-Y GASTRIC BYPASS WITH UPPER ENDOSCOPY;  Surgeon: Johnathan Hausen, MD;  Location[facility];  Service: General;  Laterality: N/A;   LOWER EXTREMITY ANGIOGRAPHY Left 05/23/2017   Procedure: LOWER EXTREMITY ANGIOGRAPHY;  Surgeon: Algernon Huxley, MD;  Location: [place] [facility];  Service: Cardiovascular;  Laterality: Left;   LOWER EXTREMITY ANGIOGRAPHY Left 05/28/2017   Procedure: LOWER EXTREMITY ANGIOGRAPHY;  Surgeon: Algernon Huxley, MD;  Location: [facility];  Service: Cardiovascular;   Laterality: Left;   LOWER EXTREMITY ANGIOGRAPHY Left 12/16/2018   Procedure: LOWER EXTREMITY ANGIOGRAPHY;  Surgeon: Algernon Huxley, MD;  Location: [facility];  Service: Cardiovascular;  Laterality: Left;   LOWER EXTREMITY ANGIOGRAPHY Right 12/23/2018   Procedure: LOWER EXTREMITY ANGIOGRAPHY;  Surgeon: Algernon Huxley, MD;  Location: [place] [facility];  Service: Cardiovascular;  Laterality: Right;   TEE WITHOUT CARDIOVERSION N/A 06/08/2014   Procedure: TRANSESOPHAGEAL ECHOCARDIOGRAM (TEE);  Surgeon: Grace Isaac, MD;  Location: [place];  Service: Open Heart Surgery;  Laterality: N/A;   TEE WITHOUT CARDIOVERSION N/A 04/14/2020   Procedure: TRANSESOPHAGEAL ECHOCARDIOGRAM (TEE);  Surgeon: Minna Merritts, MD;  Location: [facility];  Service: Cardiovascular;  Laterality: N/A;   TOE AMPUTATION Right 12/2011   rt middle toe   UPPER GI ENDOSCOPY N/A 02/09/2020   Procedure: UPPER GI ENDOSCOPY;  Surgeon: Johnathan Hausen, MD;  [facility];  Service: General;  Laterality: N/A;   US ECHOCARDIOGRAPHY  06/2014   EF 50-55%, HK of inf/post/inferolat walls, Ao sclerosis    Social History   Socioeconomic History   Marital status: Widowed    Spouse name: Not on file   Number of children: Not on file   Years of education: Not on file   Highest education level: Not on file  Occupational History   Not on file  Tobacco Use   Smoking status: Former    Packs/day: 0.50    Years: 40.00    Pack years: 20.00    Types: Cigarettes   Smokeless tobacco: Never   Tobacco comments:    started Chantix 04/12/20  Vaping Use   Vaping Use: Never used  Substance and Sexual Activity   Alcohol use: No    Alcohol/week: 0.0 standard drinks   Drug use: Not Currently    Comment: crack   Sexual activity: Not on file  Other Topics Concern   Not on file  Social History Narrative   Not on file   Social Determinants of Health   Financial Resource Strain: Not on file  Food Insecurity: Not on file   Transportation Needs: Not on file  Physical Activity: Not on file  Stress: Not on file  Social Connections: Not on file  Intimate Partner Violence: Not on file    Family History  Problem Relation Age of Onset   CAD Mother    Hypertension Mother    Diabetes Mother    CAD Father    Diabetes Father    Sickle cell trait Other    Asthma Other    No Known Allergies I? Current Facility-Administered Medications  Medication Dose Route Frequency Provider Last Rate Last Admin   0.9 %  sodium chloride infusion   Intravenous Continuous Algernon Huxley, MD 75 mL/hr at 10/23/2020 1259 New Bag at 10/12/2020 1259   acetaminophen (TYLENOL) tablet 650 mg  650 mg Oral Q6H PRN Algernon Huxley, MD   650 mg at 10/20/20 0847   Or  acetaminophen (TYLENOL) suppository 650 mg  650 mg Rectal Q6H PRN Algernon Huxley, MD       amiodarone (PACERONE) tablet 200 mg  200 mg Oral BID Algernon Huxley, MD   200 mg at 10/13/2020 1304   amLODipine (NORVASC) tablet 5 mg  5 mg Oral Daily Algernon Huxley, MD   5 mg at 09/29/2020 1305   aspirin EC tablet 81 mg  81 mg Oral Daily Algernon Huxley, MD   81 mg at 10/05/2020 1340   atorvastatin (LIPITOR) tablet 80 mg  80 mg Oral QHS Algernon Huxley, MD   80 mg at 10/20/20 2145   ceFEPIme (MAXIPIME) 2 g in sodium chloride 0.9 % 100 mL IVPB  2 g Intravenous Q8H Algernon Huxley, MD   Stopped at 10/05/2020 0038   cholecalciferol (VITAMIN D3) tablet 2,000 Units  2,000 Units Oral Daily Algernon Huxley, MD   2,000 Units at 10/05/2020 1304   clopidogrel (PLAVIX) tablet 75 mg  75 mg Oral Daily Algernon Huxley, MD   75 mg at 10/20/2020 1339   DULoxetine (CYMBALTA) DR capsule 60 mg  60 mg Oral Daily Algernon Huxley, MD   60 mg at 10/20/2020 1302   furosemide (LASIX) injection 40 mg  40 mg Intravenous Daily Algernon Huxley, MD   40 mg at 10/24/2020 1305   hydrALAZINE (APRESOLINE) tablet 25 mg  25 mg Oral Q8H PRN Algernon Huxley, MD       HYDROmorphone (DILAUDID) injection 1 mg  1 mg Intravenous Once PRN Algernon Huxley, MD       insulin aspart  (novoLOG) injection 0-15 Units  0-15 Units Subcutaneous TID WC Algernon Huxley, MD   3 Units at 10/20/20 1559   insulin aspart (novoLOG) injection 0-5 Units  0-5 Units Subcutaneous QHS Algernon Huxley, MD       insulin glargine (LANTUS) injection 6 Units  6 Units Subcutaneous QHS Algernon Huxley, MD   6 Units at 10/20/20 2146   ketorolac (TORADOL) 15 MG/ML injection 15 mg  15 mg Intravenous Q8H PRN Algernon Huxley, MD   15 mg at 10/11/2020 0507   metoprolol tartrate (LOPRESSOR) tablet 50 mg  50 mg Oral BID Algernon Huxley, MD   50 mg at 10/20/2020 1304   metroNIDAZOLE (FLAGYL) IVPB 500 mg  500 mg Intravenous Q8H Algernon Huxley, MD   Stopped at 10/05/2020 0303   morphine 2 MG/ML injection 1 mg  1 mg Intravenous Q4H PRN Algernon Huxley, MD   1 mg at 10/20/2020 1534   ondansetron (ZOFRAN) tablet 4 mg  4 mg Oral Q6H PRN Algernon Huxley, MD       Or   ondansetron (ZOFRAN) injection 4 mg  4 mg Intravenous Q6H PRN Algernon Huxley, MD       ondansetron (ZOFRAN) injection 4 mg  4 mg Intravenous Q6H PRN Algernon Huxley, MD       oxybutynin (DITROPAN) tablet 5 mg  5 mg Oral BID Algernon Huxley, MD   5 mg at 10/28/2020 1302   pantoprazole (PROTONIX) EC tablet 40 mg  40 mg Oral Daily Algernon Huxley, MD   40 mg at 10/16/2020 1303   pregabalin (LYRICA) capsule 100 mg  100 mg Oral TID Algernon Huxley, MD   100 mg at 10/06/2020 1303   sodium chloride 0.9 % with ceFAZolin (ANCEF) ADS Med  tiZANidine (ZANAFLEX) tablet 4 mg  4 mg Oral TID PRN Algernon Huxley, MD   4 mg at 10/20/20 1614   vancomycin (VANCOREADY) IVPB 1250 mg/250 mL  1,250 mg Intravenous Q24H Algernon Huxley, MD   Stopped at 10/20/20 2146     Abtx:  Anti-infectives (From admission, onward)    Start     Dose/Rate Route Frequency Ordered Stop   10/23/2020 0000  ceFAZolin (ANCEF) IVPB 1 g/50 mL premix       Note to Pharmacy: To be given in specials   1 g 100 mL/hr over 30 Minutes Intravenous  Once 10/07/2020 0716 10/23/2020 0852   10/23/2020 0729  sodium chloride 0.9 % with ceFAZolin (ANCEF) ADS Med        Note to Pharmacy: Fransico Michael   : cabinet override      10/10/2020 0729 10/24/2020 1944   10/20/20 2000  vancomycin (VANCOREADY) IVPB 1250 mg/250 mL        1,250 mg 166.7 mL/hr over 90 Minutes Intravenous Every 24 hours 10/14/2020 2006     10/20/20 1000  metroNIDAZOLE (FLAGYL) IVPB 500 mg        500 mg 100 mL/hr over 60 Minutes Intravenous Every 8 hours 10/20/20 0919     10/20/20 0100  ceFEPIme (MAXIPIME) 2 g in sodium chloride 0.9 % 100 mL IVPB        2 g 200 mL/hr over 30 Minutes Intravenous Every 8 hours 10/03/2020 2006     10/04/2020 1600  ceFEPIme (MAXIPIME) 2 g in sodium chloride 0.9 % 100 mL IVPB        2 g 200 mL/hr over 30 Minutes Intravenous  Once 10/18/2020 1549 09/30/2020 1806   10/02/2020 1600  metroNIDAZOLE (FLAGYL) IVPB 500 mg        500 mg 100 mL/hr over 60 Minutes Intravenous  Once 10/01/2020 1549 10/07/2020 1840   10/20/2020 1600  vancomycin (VANCOCIN) IVPB 1000 mg/200 mL premix  Status:  Discontinued        1,000 mg 200 mL/hr over 60 Minutes Intravenous  Once 10/05/2020 1549 10/28/2020 1553   10/05/2020 1600  vancomycin (VANCOREADY) IVPB 2000 mg/400 mL        2,000 mg 200 mL/hr over 120 Minutes Intravenous  Once 10/20/2020 1553 10/16/2020 2130       REVIEW OF SYSTEMS:  Const: negative fever, negative chills, negative weight loss Eyes: negative diplopia or visual changes, negative eye pain ENT: negative coryza, negative sore throat Resp: negative cough, hemoptysis, dyspnea Cards: negative for chest pain, palpitations, has lower extremity edema GU: negative for frequency, dysuria and hematuria GI: Negative for abdominal pain, diarrhea, bleeding, constipation Skin: negative for rash and pruritus Heme: negative for easy bruising and gum/nose bleeding MS: weakness Neurolo:falls Psych: negative for feelings of anxiety, depression  Endocrine:  diabetes Allergy/Immunology- negative for any medication or food allergies ? Objective:  VITALS:  BP 108/68 (BP Location: Left Arm)   Pulse 96    Temp 98.5 F (36.9 C) (Oral)   Resp 18   Ht 5\' 8"  (1.727 m)   Wt 110.7 kg   SpO2 (!) 89%   BMI 37.10 kg/m  PHYSICAL EXAM:  General: Alert, cooperative, no distress, appears stated age.  Head: Normocephalic, without obvious abnormality, atraumatic. Eyes: Conjunctivae clear, anicteric sclerae. Pupils are equal ENT Nares normal. No drainage or sinus tenderness. Lips, mucosa, and tongue normal. No Thrush Neck: Supple, symmetrical, no adenopathy, thyroid: non tender no carotid bruit and no JVD. Back: No CVA  tenderness. Lungs: b/l air entry Heart: irregular Sternal scar Abdomen: Soft, lap scars Extremities: r foot heel ulcer picture seen  Left great toe and 3rd toe amputated Skin: No rashes or lesions. Or bruising Lymph: Cervical, supraclavicular normal. Neurologic: Grossly non-focal Pertinent Labs Lab Results CBC    Component Value Date/Time   WBC 13.6 (H) 10/15/2020 0519   RBC 3.69 (L) 10/09/2020 0519   HGB 9.5 (L) 09/29/2020 0519   HGB 13.1 10/10/2016 1911   HCT 27.6 (L) 10/12/2020 0519   HCT 40.6 10/10/2016 1911   PLT 340 10/23/2020 0519   PLT 301 10/10/2016 1911   MCV 74.8 (L) 10/06/2020 0519   MCV 78 (L) 10/10/2016 1911   MCV 77 (L) 06/03/2014 0405   MCH 25.7 (L) 10/12/2020 0519   MCHC 34.4 10/23/2020 0519   RDW 19.5 (H) 10/07/2020 0519   RDW 15.9 (H) 10/10/2016 1911   RDW 15.4 (H) 06/03/2014 0405   LYMPHSABS 1.5 10/28/2020 1419   LYMPHSABS 3.1 10/10/2016 1911   LYMPHSABS 1.5 06/03/2014 0405   MONOABS 1.1 (H) 10/05/2020 1419   MONOABS 1.0 (H) 06/03/2014 0405   EOSABS 0.1 09/30/2020 1419   EOSABS 0.6 (H) 10/10/2016 1911   EOSABS 0.5 06/03/2014 0405   BASOSABS 0.0 10/16/2020 1419   BASOSABS 0.0 10/10/2016 1911   BASOSABS 0.0 06/03/2014 0405    CMP Latest Ref Rng & Units 10/22/2020 10/20/2020 10/11/2020  Glucose 70 - 99 mg/dL 134(H) 158(H) 128(H)  BUN 8 - 23 mg/dL 14 12 11   Creatinine 0.44 - 1.00 mg/dL 1.16(H) 1.10(H) 1.13(H)  Sodium 135 - 145 mmol/L 143  139 138  Potassium 3.5 - 5.1 mmol/L 3.7 3.2(L) 2.5(LL)  Chloride 98 - 111 mmol/L 105 100 98  CO2 22 - 32 mmol/L 32 31 31  Calcium 8.9 - 10.3 mg/dL 8.1(L) 7.9(L) 7.7(L)  Total Protein 6.5 - 8.1 g/dL - - 6.1(L)  Total Bilirubin 0.3 - 1.2 mg/dL - - 1.5(H)  Alkaline Phos 38 - 126 U/L - - 140(H)  AST 15 - 41 U/L - - 19  ALT 0 - 44 U/L - - 17      Microbiology: Recent Results (from the past 240 hour(s))  Resp Panel by RT-PCR (Flu A&B, Covid) Nasopharyngeal Swab     Status: None   Collection Time: 10/03/2020  4:49 PM   Specimen: Nasopharyngeal Swab; Nasopharyngeal(NP) swabs in vial transport medium  Result Value Ref Range Status   SARS Coronavirus 2 by RT PCR NEGATIVE NEGATIVE Final    Comment: (NOTE) SARS-CoV-2 target nucleic acids are NOT DETECTED.  The SARS-CoV-2 RNA is generally detectable in upper respiratory specimens during the acute phase of infection. The lowest concentration of SARS-CoV-2 viral copies this assay can detect is 138 copies/mL. A negative result does not preclude SARS-Cov-2 infection and should not be used as the sole basis for treatment or other patient management decisions. A negative result may occur with  improper specimen collection/handling, submission of specimen other than nasopharyngeal swab, presence of viral mutation(s) within the areas targeted by this assay, and inadequate number of viral copies(<138 copies/mL). A negative result must be combined with clinical observations, patient history, and epidemiological information. The expected result is Negative.  Fact Sheet for Patients:  EntrepreneurPulse.com.au  Fact Sheet for Healthcare Providers:  IncredibleEmployment.be  This test is no t yet approved or cleared by the [place] FDA and  has been authorized for detection and/or diagnosis of SARS-CoV-2 by FDA under an Emergency Use Authorization (EUA). This EUA will  remain  in effect (meaning this test can be  used) for the duration of the COVID-19 declaration under Section 564(b)(1) of the Act, 21 U.S.C.section 360bbb-3(b)(1), unless the authorization is terminated  or revoked sooner.       Influenza A by PCR NEGATIVE NEGATIVE Final   Influenza B by PCR NEGATIVE NEGATIVE Final    Comment: (NOTE) The Xpert Xpress SARS-CoV-2/FLU/RSV plus assay is intended as an aid in the diagnosis of influenza from Nasopharyngeal swab specimens and should not be used as a sole basis for treatment. Nasal washings and aspirates are unacceptable for Xpert Xpress SARS-CoV-2/FLU/RSV testing.  Fact Sheet for Patients: EntrepreneurPulse.com.au  Fact Sheet for Healthcare Providers: IncredibleEmployment.be  This test is not yet approved or cleared by the [place] FDA and has been authorized for detection and/or diagnosis of SARS-CoV-2 by FDA under an Emergency Use Authorization (EUA). This EUA will remain in effect (meaning this test can be used) for the duration of the COVID-19 declaration under Section 564(b)(1) of the Act, 21 U.S.C. section 360bbb-3(b)(1), unless the authorization is terminated or revoked.  Performed at [facility], [address] [place], [place] [address]   Blood Culture (routine x 2)     Status: None (Preliminary result)   Collection Time: 10/12/2020  4:49 PM   Specimen: BLOOD  Result Value Ref Range Status   Specimen Description BLOOD RIGHT ANTECUBITAL  Final   Special Requests   Final    BOTTLES DRAWN AEROBIC AND ANAEROBIC Blood Culture adequate volume   Culture   Final    NO GROWTH 2 DAYS Performed at [facility], [address], [place], [place] [address]    Report Status PENDING  Incomplete  Blood Culture (routine x 2)     Status: None (Preliminary result)   Collection Time: 10/07/2020  5:15 PM   Specimen: BLOOD  Result Value Ref Range Status   Specimen Description BLOOD LEFT ANTECUBITAL  Final   Special Requests    Final    BOTTLES DRAWN AEROBIC AND ANAEROBIC Blood Culture adequate volume   Culture   Final    NO GROWTH 2 DAYS Performed at [facility], [address], [place], [place] [address]    Report Status PENDING  Incomplete    IMAGING RESULTS:  I have personally reviewed the films ? Impression/Recommendation ? 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 Change the latter to unasyn  PAD s/p stents and angioplasties in the past Underwent mechanical thrombectomy of rt popliteal, angioplasty of popliteal, rt peroneal and tibioperoneal trunk, rt ATA.  DM on lantus  CAD s/p CABG on atorvastatin, metoprolol  HTN on amlodipine Afib/flutter- on amiodarone   ___________________________________________________ Discussed with patient, requesting provider Note:  This document was prepared using Dragon voice recognition software and may include unintentional dictation errors.

## 2020-10-21 NOTE — Progress Notes (Signed)
   10/12/2020 2130  Clinical Encounter Type  Visited With Family;Patient and family together  Visit Type Follow-up  Referral From Nurse  Consult/Referral To Chaplain  Spiritual Encounters  Spiritual Needs Emotional;Grief support  Melinda [name] paged to ICU to support family. Ms. Vandall sisters were called to meet with the physician and Melinda [name] stood by to assess their needs. Melinda [name] engaged the family to offer them emotional and spiritual support. Chaplain Burris inquired as to their needs and left the family to be together; informed them that pastoral care support would be available throughout the night.

## 2020-10-21 NOTE — Progress Notes (Signed)
   10/24/2020 1445  Clinical Encounter Type  Visited With Patient and family together  Visit Type Follow-up;Spiritual support;Social support  Referral From Chaplain  Consult/Referral To [place] did a FU visit in room 147 Pt, [name] [name] is a 65 year old female with medical history significant for CKD 3B, insulin-dependent diabetes mellitus, anxiety, and depression. Her current status is post left first toe amputation, right toe amputation, who presents to the emergency department for chief concerns of right heel wound.[name] was just getting back from surgery. She said her surgery went so so. I provided words of encouragement, reflective listening, and emotional support.

## 2020-10-21 NOTE — Anesthesia Postprocedure Evaluation (Signed)
Anesthesia Post Note  Patient: Melinda [name]  Procedure(s) Performed: Lower Extremity Angiography (Right)  Patient location during evaluation: PACU Anesthesia Type: General Level of consciousness: awake and alert, awake and oriented Pain management: pain level controlled Vital Signs Assessment: post-procedure vital signs reviewed and stable Respiratory status: spontaneous breathing, nonlabored ventilation and respiratory function stable Cardiovascular status: blood pressure returned to baseline and stable Postop Assessment: no apparent nausea or vomiting Anesthetic complications: no   No notable events documented.   Last Vitals:  Vitals:   10/20/2020 1200 10/18/2020 1215  BP: 135/78 118/73  Pulse: (!) 102 (!) 102  Resp: 12 14  Temp:  (!) 36.1 C  SpO2: 93% 94%    Last Pain:  Vitals:   09/30/2020 1200  TempSrc:   PainSc: 0-No pain                 Phill Mutter

## 2020-10-21 NOTE — Progress Notes (Signed)
   10/18/2020 1855  Clinical Encounter Type  Visited With Patient not available  Visit Type Initial  Referral From Other (Comment) (Rapid response)  Recommendations spiritual care follow up  Spiritual Encounters  Spiritual Needs  (not assessed)  Chaplain Burris attended a rapid response. Ms. Glanzer not available while receiving emergent care. Daryel November bore witness to care and provided compassionate, non-anxious presence, and silent prayer. Will recommend spiritual care follow-up to pastoral care team.

## 2020-10-21 NOTE — Progress Notes (Signed)
Pt transferred back to room 147 following procedure. VSS. All requests addressed at this time.

## 2020-10-21 NOTE — Consult Note (Signed)
ANTICOAGULATION CONSULT NOTE - Follow Up Consult  Pharmacy Consult for Heparin Drip Indication: atrial fibrillation  No Known Allergies  Patient Measurements: Height: 5\' 8"  (172.7 cm) Weight: 110.7 kg (244 lb) IBW/kg (Calculated) : 63.9 Heparin dosing wt 89.1 kg  Vital Signs: Temp: 98.6 F (37 C) (06/22 2351) Temp Source: Oral (06/22 1533) BP: 106/71 (06/22 2351) Pulse Rate: 81 (06/22 2351)  Labs: Recent Labs    10/24/2020 1419 10/06/2020 1649 10/20/20 0924 10/20/20 0930 10/20/20 1559 10/20/20 2255  HGB 10.0*  --   --  9.7*  --   --   HCT 29.8*  --   --  29.0*  --   --   PLT 351  --   --  342  --   --   APTT  --  48*  --   --  45* 51*  LABPROT  --  23.0*  --  21.6*  --   --   INR  --  2.0*  --  1.9*  --   --   HEPARINUNFRC  --   --  >1.10*  --   --   --   CREATININE 1.13*  --   --  1.10*  --   --      Estimated Creatinine Clearance: 64.7 mL/min (A) (by C-G formula based on SCr of 1.1 mg/dL (H)).   Medications:  PTA apixiban dose 5mg  bid - last dose 6/21 @ 1000  Assessment: 65 yo female with history of a fib and PAD - pharmacy has been consulted to initiate and monitor a heparin drip pending surgical plan.  Baseline HGB and APTT as above.    Goal of Therapy:  Heparin level 0.3-0.7 units/ml Monitor platelets by anticoagulation protocol: Yes   Plan:  6/22:  aPTT @ 2255 = 51, subtherapeutic Will order heparin 2600 units IV X 1 bolus and increase drip rate 1700 units/hr.  Will recheck aPTT and HL 6 hrs after rate change.   Orene Desanctis, PharmD Clinical Pharmacist 10/15/2020 2:36 AM

## 2020-10-21 NOTE — Progress Notes (Signed)
Cross coverage PROGRESS NOTE    Melinda [name]  QIW:[id number] DOB: 1955/11/05 DOA: 10/12/2020 PCP: Donnie Coffin, MD (Confirm with patient/family/NH records and if not entered, this HAS to be entered at [facility] point of entry. "No PCP" if truly none.)   Brief Narrative: 64 year old female with history of CABG, PVD with amputations of the toe, CKD 3, history of gastric bypass, A. fib on Eliquis and systolic heart failure last EF 30 to 35%, admitted with sepsis secondary to osteomyelitis from a diabetic foot infection.  7:02 PM received signout from outgoing physician that patient was having respiratory distress and Lasix had been ordered along with chest x-ray.  Soon thereafafter was called to evaluate patient at bedside due to concern for agonal breathing.  On the way to evaluate patient CODE BLUE was announced.  On my arrival, patient was minimally responsive, and was being bagged by the respiratory team.  Patient had a palpable pulse of around 78, O2 sat 98% on 10 L.  Minimally responsive to sternal rub.  No chest compressions due to presence of pulse. The emergency room team arrived soon thereafter.  Patient started becoming a bit more responsive and was able to answer to her name.  Blood sugar resulted at 212, ABG resulted at pH 7.2, PCO2 25 and PO2 54.  After brief period of increased responsiveness with bagging patient again became minimally responsive and decision made at that time to intubate.  She was intubated by ED provider with a single pass and transferred to the ICU.  Intensivist consult placed.  ABG in EKG reviewed.  Rainbow labs ordered.  Discussed with intensivist, Dr. Loanne Drilling in the ICU  Assessment & Plan:     Acute respiratory failure with hypoxia requiring mechanical ventilation -At the present time etiology uncertain.  Patient is currently on IV heparin as a bridge to her angiogram procedure earlier so this decreases suspicion for acute PE and ACS though not ruled  out -EKG showed questionable ST elevation but on comparing with past EKGs, only slight accentuation of prior changes - Follow-up stat troponins, CBC, chest x-ray - Continue heparin infusion - Care transferred to PCCM at this time due to change in level of care  CAD with history of S/P CABG x 4 PAD with history of toe amputations, status post angiogram on 6/23 HFrEF with EF 30 to 35% Paroxysmal atrial fibrillation Sepsis secondary to acute osteomyelitis of the right calcaneum Obesity with BMI 37 in the setting of history of gastric bypass CKD 3  Patient is critically ill with guarded prognosis in view of multiple comorbidities.    CRITICAL CARE Performed by: Athena Masse   Total critical care time: 120 minutes  Critical care time was exclusive of separately billable procedures and treating other patients.  Critical care was necessary to treat or prevent imminent or life-threatening deterioration.  Critical care was time spent personally by me on the following activities: development of treatment plan with patient and/or surrogate as well as nursing, discussions with consultants, evaluation of patient's response to treatment, examination of patient, obtaining history from patient or surrogate, ordering and performing treatments and interventions, ordering and review of laboratory studies, ordering and review of radiographic studies, pulse oximetry and re-evaluation of patient's condition.   Subjective: Unable to obtain  Objective: Vitals:   10/07/2020 1200 10/13/2020 1215 10/11/2020 1324 10/03/2020 1532  BP: 135/78 118/73 (!) 116/93 108/68  Pulse: (!) 102 (!) 102 (!) 101 96  Resp: 12 14 16  18  Temp:  (!) 97 F (36.1 C) 98.7 F (37.1 C) 98.5 F (36.9 C)  TempSrc:    Oral  SpO2: 93% 94% 94% (!) 89%  Weight:      Height:        Intake/Output Summary (Last 24 hours) at 10/27/2020 2053 Last data filed at 10/05/2020 1200 Gross per 24 hour  Intake 2322.9 ml  Output 820 ml  Net  1502.9 ml   Filed Weights   10/20/2020 1416 10/24/2020 0801  Weight: 110.7 kg 110.7 kg    Examination:  General exam: Unresponsive Respiratory system: Decreased air entry bilaterally with agonal breathing  cardiovascular system: S1 & S2 heard,  Gastrointestinal system: Abdomen is nondistended, soft Central nervous system: Unresponsive without focalizing signs    Data Reviewed:   CBC: Recent Labs  Lab 10/24/2020 1419 10/20/20 0930 10/02/2020 0519 10/14/2020 2030  WBC 14.7* 15.0* 13.6* 26.1*  NEUTROABS 12.0*  --   --   --   HGB 10.0* 9.7* 9.5* 9.0*  HCT 29.8* 29.0* 27.6* 27.5*  MCV 77.2* 76.7* 74.8* 79.0*  PLT 351 342 340 366   Basic Metabolic Panel: Recent Labs  Lab 10/17/2020 1419 10/20/20 0924 10/20/20 0930 10/20/2020 0519  NA 138  --  139 143  K 2.5*  --  3.2* 3.7  CL 98  --  100 105  CO2 31  --  31 32  GLUCOSE 128*  --  158* 134*  BUN 11  --  12 14  CREATININE 1.13*  --  1.10* 1.16*  CALCIUM 7.7*  --  7.9* 8.1*  MG  --  1.4*  --  2.0   GFR: Estimated Creatinine Clearance: 61.4 mL/min (A) (by C-G formula based on SCr of 1.16 mg/dL (H)). Liver Function Tests: Recent Labs  Lab 09/29/2020 1419  AST 19  ALT 17  ALKPHOS 140*  BILITOT 1.5*  PROT 6.1*  ALBUMIN 2.1*   No results for input(s): LIPASE, AMYLASE in the last 168 hours. No results for input(s): AMMONIA in the last 168 hours. Coagulation Profile: Recent Labs  Lab 10/03/2020 1649 10/20/20 0930  INR 2.0* 1.9*   Cardiac Enzymes: No results for input(s): CKTOTAL, CKMB, CKMBINDEX, TROPONINI in the last 168 hours. BNP (last 3 results) No results for input(s): PROBNP in the last 8760 hours. HbA1C: Recent Labs    10/20/20 0924  HGBA1C 7.5*   CBG: Recent Labs  Lab 10/12/2020 0747 10/13/2020 1005 10/02/2020 1555 10/18/2020 1920 10/03/2020 1938  GLUCAP 126* 152* 217* 212* 197*   Lipid Profile: No results for input(s): CHOL, HDL, LDLCALC, TRIG, CHOLHDL, LDLDIRECT in the last 72 hours. Thyroid Function  Tests: No results for input(s): TSH, T4TOTAL, FREET4, T3FREE, THYROIDAB in the last 72 hours. Anemia Panel: No results for input(s): VITAMINB12, FOLATE, FERRITIN, TIBC, IRON, RETICCTPCT in the last 72 hours. Sepsis Labs: Recent Labs  Lab 10/04/2020 1419 10/27/2020 1649 10/20/20 0924 10/20/20 0930  PROCALCITON  --   --   --  <0.10  LATICACIDVEN 2.5* 2.0* 1.7  --     Recent Results (from the past 240 hour(s))  Resp Panel by RT-PCR (Flu A&B, Covid) Nasopharyngeal Swab     Status: None   Collection Time: 09/30/2020  4:49 PM   Specimen: Nasopharyngeal Swab; Nasopharyngeal(NP) swabs in vial transport medium  Result Value Ref Range Status   SARS Coronavirus 2 by RT PCR NEGATIVE NEGATIVE Final    Comment: (NOTE) SARS-CoV-2 target nucleic acids are NOT DETECTED.  The SARS-CoV-2 RNA is generally  detectable in upper respiratory specimens during the acute phase of infection. The lowest concentration of SARS-CoV-2 viral copies this assay can detect is 138 copies/mL. A negative result does not preclude SARS-Cov-2 infection and should not be used as the sole basis for treatment or other patient management decisions. A negative result may occur with  improper specimen collection/handling, submission of specimen other than nasopharyngeal swab, presence of viral mutation(s) within the areas targeted by this assay, and inadequate number of viral copies(<138 copies/mL). A negative result must be combined with clinical observations, patient history, and epidemiological information. The expected result is Negative.  Fact Sheet for Patients:  EntrepreneurPulse.com.au  Fact Sheet for Healthcare Providers:  IncredibleEmployment.be  This test is no t yet approved or cleared by the [place] FDA and  has been authorized for detection and/or diagnosis of SARS-CoV-2 by FDA under an Emergency Use Authorization (EUA). This EUA will remain  in effect (meaning this test  can be used) for the duration of the COVID-19 declaration under Section 564(b)(1) of the Act, 21 U.S.C.section 360bbb-3(b)(1), unless the authorization is terminated  or revoked sooner.       Influenza A by PCR NEGATIVE NEGATIVE Final   Influenza B by PCR NEGATIVE NEGATIVE Final    Comment: (NOTE) The Xpert Xpress SARS-CoV-2/FLU/RSV plus assay is intended as an aid in the diagnosis of influenza from Nasopharyngeal swab specimens and should not be used as a sole basis for treatment. Nasal washings and aspirates are unacceptable for Xpert Xpress SARS-CoV-2/FLU/RSV testing.  Fact Sheet for Patients: EntrepreneurPulse.com.au  Fact Sheet for Healthcare Providers: IncredibleEmployment.be  This test is not yet approved or cleared by the [place] FDA and has been authorized for detection and/or diagnosis of SARS-CoV-2 by FDA under an Emergency Use Authorization (EUA). This EUA will remain in effect (meaning this test can be used) for the duration of the COVID-19 declaration under Section 564(b)(1) of the Act, 21 U.S.C. section 360bbb-3(b)(1), unless the authorization is terminated or revoked.  Performed at [facility], [address] [place], [place] [address]   Blood Culture (routine x 2)     Status: None (Preliminary result)   Collection Time: 10/11/2020  4:49 PM   Specimen: BLOOD  Result Value Ref Range Status   Specimen Description BLOOD RIGHT ANTECUBITAL  Final   Special Requests   Final    BOTTLES DRAWN AEROBIC AND ANAEROBIC Blood Culture adequate volume   Culture   Final    NO GROWTH 2 DAYS Performed at [facility], [address], [place], [place] [address]    Report Status PENDING  Incomplete  Blood Culture (routine x 2)     Status: None (Preliminary result)   Collection Time: 10/13/2020  5:15 PM   Specimen: BLOOD  Result Value Ref Range Status   Specimen Description BLOOD LEFT ANTECUBITAL  Final   Special  Requests   Final    BOTTLES DRAWN AEROBIC AND ANAEROBIC Blood Culture adequate volume   Culture   Final    NO GROWTH 2 DAYS Performed at [facility], [address], [place], [place] [address]    Report Status PENDING  Incomplete         Radiology Studies: MR ANKLE RIGHT WO CONTRAST  Result Date: 10/20/2020 CLINICAL DATA:  Osteomyelitis suspected, ankle, xray done Necrotic heel ulcer. Suspect osteomyelitis. EXAM: MRI OF THE RIGHT ANKLE WITHOUT CONTRAST TECHNIQUE: Multiplanar, multisequence MR imaging of the ankle was performed. No intravenous contrast was administered. COMPARISON:  Radiograph  10/18/2020 FINDINGS: TENDONS Peroneal: There is focal mild tenosynovitis of the peroneal longus and brevis tendons just below the lateral malleolus. There is intramuscular edema proximally. Posteromedial: Posterior tibial tendon intact. Flexor digitorum longus tendon intact. Flexor hallucis longus tendon intact. Anterior: Tibialis anterior tendon intact. Extensor hallucis longus tendon intact Extensor digitorum longus tendon intact. Achilles:  Intact. Plantar Fascia: Intact. LIGAMENTS Lateral: Anterior talofibular ligament intact. Calcaneofibular ligament intact. Posterior talofibular ligament intact. Anterior and posterior tibiofibular ligaments intact. Medial: Deltoid ligament intact. Spring ligament intact. CARTILAGE Ankle Joint: No joint effusion. Normal ankle mortise. No chondral defect. There is mild midfoot arthritis. Subtalar Joints/Sinus Tarsi: Normal subtalar joints. No subtalar joint effusion. Normal sinus tarsi. Bones: There is bony edema in some associated low T1 signal along the lateral inferior calcaneus in likely cortical disruption. This is underlying a soft tissue ulcer with surrounding edema and subcutaneous gas. Soft Tissue: Lateral hindfoot ulcer as above. There is no well-defined soft tissue abscess. Extensive intramuscular and subcutaneous edema in the foot. IMPRESSION: Lateral  hindfoot ulcer with underlying osteomyelitis of the inferolateral calcaneus, subcutaneous soft tissue gas and edema. No no well-defined soft tissue abscess. Focal mild tenosynovitis of the peroneal longus and brevis tendons just below the lateral malleolus. Electronically Signed   By: Maurine Simmering   On: 10/20/2020 07:19   PERIPHERAL VASCULAR CATHETERIZATION  Result Date: 10/18/2020 See surgical note for result.  DG Chest Port 1 View  Result Date: 10/20/2020 CLINICAL DATA:  Hypoxia. EXAM: PORTABLE CHEST 1 VIEW COMPARISON:  10/07/2020. FINDINGS: Prior CABG. Cardiomegaly. Diffuse bilateral pulmonary infiltrates/edema. No pleural effusion or pneumothorax. Degenerative change thoracic spine. IMPRESSION: 1.  Prior CABG.  Cardiomegaly. 2.  Diffuse bilateral pulmonary infiltrates/edema. Electronically Signed   By: Marcello Moores  Register   On: 10/20/2020 10:26        Scheduled Meds:  amiodarone  200 mg Oral BID   amLODipine  5 mg Oral Daily   aspirin EC  81 mg Oral Daily   atorvastatin  80 mg Oral QHS   Chlorhexidine Gluconate Cloth  6 each Topical Daily   cholecalciferol  2,000 Units Oral Daily   clopidogrel  75 mg Oral Daily   DULoxetine  60 mg Oral Daily   furosemide  40 mg Intravenous Daily   insulin aspart  0-15 Units Subcutaneous TID WC   insulin aspart  0-5 Units Subcutaneous QHS   insulin glargine  6 Units Subcutaneous QHS   metoprolol tartrate  50 mg Oral BID   oxybutynin  5 mg Oral BID   pantoprazole  40 mg Oral Daily   pregabalin  100 mg Oral TID   Continuous Infusions:  sodium chloride 75 mL/hr at 10/08/2020 1259   amiodarone 60 mg/hr (10/27/2020 2025)   Followed by   Derrill Memo ON 10/06/2020] amiodarone     [START ON 10/01/2020] ampicillin-sulbactam (UNASYN) IV     ceFEPime (MAXIPIME) IV 2 g (10/24/2020 1741)   epinephrine     metronidazole Stopped (09/29/2020 0303)   norepinephrine     vancomycin Stopped (10/20/20 2146)     LOS: 1 day    Time spent: [place],  MD [facility]

## 2020-10-21 NOTE — Interval H&P Note (Signed)
History and Physical Interval Note:  10/18/2020 8:27 AM  Melinda [name]  has presented today for surgery, with the diagnosis of Atherosclerotic Disease With Gangrene.  The various methods of treatment have been discussed with the patient and family. After consideration of risks, benefits and other options for treatment, the patient has consented to  Procedure(s): Lower Extremity Angiography (Right) as a surgical intervention.  The patient's history has been reviewed, patient examined, no change in status, stable for surgery.  I have reviewed the patient's chart and labs.  Questions were answered to the patient's satisfaction.     [name]

## 2020-10-21 NOTE — Procedures (Signed)
Central Venous Catheter Insertion Procedure Note  Shuna Tabor  [id number]  1955/10/28  Date:10/23/2020  Time:9:43 PM   Provider Performing:Andersen Mckiver Rodman Pickle   Procedure: Insertion of Non-tunneled Central Venous Catheter(36556) with US guidance (10034)   Indication(s) Medication administration and Difficult access  Consent Unable to obtain consent due to emergent nature of procedure.  Anesthesia None  Timeout Verified patient identification, verified procedure, site/side was marked, verified correct patient position, special equipment/implants available, medications/allergies/relevant history reviewed, required imaging and test results available.  Sterile Technique Maximal sterile technique including full sterile barrier drape, hand hygiene, sterile gown, sterile gloves, mask, hair covering, sterile ultrasound probe cover (if used).  Procedure Description Area of catheter insertion was cleaned with chlorhexidine and draped in sterile fashion.  With real-time ultrasound guidance a central venous catheter was placed into the right internal jugular vein. Nonpulsatile blood flow and easy flushing noted in all ports.  The catheter was sutured in place and sterile dressing applied.     Complications/Tolerance None; patient tolerated the procedure well. Chest X-ray is ordered to verify placement for internal jugular or subclavian cannulation.   Chest x-ray is not ordered for femoral cannulation.  EBL Minimal  Specimen(s) None  Rodman Pickle, M.D. [facility] Pulmonary/Critical Care Medicine 10/07/2020 9:44 PM

## 2020-10-21 NOTE — Anesthesia Procedure Notes (Signed)
Procedure Name: Intubation Date/Time: 10/18/2020 8:31 AM Performed by: Johnna Acosta, CRNA Pre-anesthesia Checklist: Patient identified, Emergency Drugs available, Suction available, Patient being monitored and Timeout performed Patient Re-evaluated:Patient Re-evaluated prior to induction Oxygen Delivery Method: Circle system utilized Preoxygenation: Pre-oxygenation with 100% oxygen Induction Type: IV induction and Rapid sequence Ventilation: Mask ventilation without difficulty Laryngoscope Size: McGraph and 3 Grade View: Grade I Tube type: Oral Airway Equipment and Method: Stylet, Video-laryngoscopy and Oral airway Placement Confirmation: ETT inserted through vocal cords under direct vision, positive ETCO2 and breath sounds checked- equal and bilateral Secured at: 21 cm Tube secured with: Tape Dental Injury: Teeth and Oropharynx as per pre-operative assessment  Difficulty Due To: Difficulty was anticipated, Difficult Airway- due to large tongue and Difficult Airway- due to limited oral opening

## 2020-10-21 NOTE — Op Note (Signed)
[facility]  Percutaneous Study/Intervention Procedural Note   Date of Surgery: 10/10/2020  Surgeon(s):Elika Godar    Assistants:none  Pre-operative Diagnosis: PAD with ulceration and infection right lower extremity  Post-operative diagnosis:  Same  Procedure(s) Performed:             1.  Ultrasound guidance for vascular access left femoral artery             2.  Catheter placement into right common femoral artery from left femoral approach             3.  Aortogram and selective right lower extremity angiogram             4.  Percutaneous transluminal angioplasty of right peroneal artery and tibioperoneal trunk with 2.5 mm diameter by 15 cm length angioplasty balloon             5.  Mechanical thrombectomy of the right popliteal artery with the [place] Rex device  6.  Percutaneous transluminal angioplasty of the right popliteal artery with 4 mm diameter by 10 cm length Lutonix drug-coated angioplasty balloon  7.  Percutaneous transluminal angioplasty of the right anterior tibial artery with 2 inflations with a 3 mm diameter by 10 cm length angioplasty balloon             8.  StarClose closure device left femoral artery  EBL: 50 cc  Contrast: 50 cc  Fluoro Time: 7.8 minutes  Anesthesia: General              Indications:  Patient is a 65 y.o.female with nonhealing ulceration and infection of the right foot and heel. The patient has longstanding history of peripheral arterial disease with bilateral interventions about 2 years ago.  She also has some ulcerations on her left foot.  These are not as severe. The patient is brought in for angiography for further evaluation and potential treatment.  Due to the limb threatening nature of the situation, angiogram was performed for attempted limb salvage. The patient is aware that if the procedure fails, amputation would be expected.  The patient also understands that even with successful revascularization, amputation may  still be required due to the severity of the situation.  Risks and benefits are discussed and informed consent is obtained.   Procedure:  The patient was identified and appropriate procedural time out was performed.  The patient was then placed supine on the table and prepped and draped in the usual sterile fashion. Moderate conscious sedation was administered during a face to face encounter with the patient throughout the procedure with my supervision of the RN administering medicines and monitoring the patient's vital signs, pulse oximetry, telemetry and mental status throughout from the start of the procedure until the patient was taken to the recovery room. Ultrasound was used to evaluate the left common femoral artery.  It was patent .  A digital ultrasound image was acquired.  A Seldinger needle was used to access the left common femoral artery under direct ultrasound guidance and a permanent image was performed.  A 0.035 J wire was advanced without resistance and a 5Fr sheath was placed.  Pigtail catheter was placed into the aorta and an AP aortogram was performed. This demonstrated normal renal arteries and normal aorta and iliac segments without significant stenosis. I then crossed the aortic bifurcation and advanced to the right femoral head. Selective right lower extremity angiogram was then performed. This demonstrated normal common femoral artery, small profunda femoris artery  which was diseased, superficial femoral artery with only mild disease.  The popliteal artery occluded just above the knee and the occlusion continued down through all 3 tibial vessels proximally.  The anterior tibial and peroneal arteries reconstituted in the proximal segment with the posterior tibial artery reconstituting in the midsegment.  All 3 vessels were then continuous after reconstitution. It was felt that it was in the patient's best interest to proceed with intervention after these images to avoid a second procedure  and a larger amount of contrast and fluoroscopy based off of the findings from the initial angiogram. The patient was systemically heparinized and a 6 [place] Ansell sheath was then placed over the [employer] wire. I then used a Kumpe catheter and the advantage wire to fairly easily navigate through the occlusion of the popliteal artery, tibioperoneal trunk, and proximal peroneal artery and down in the proximal to mid peroneal artery and confirming intraluminal flow.  I then placed a V 18 wire.  Angioplasty was performed of the peroneal artery and the tibioperoneal trunk with a 2.5 mm diameter by 15 cm length angioplasty balloon inflated to 10 atm for 1 minute.  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  The [place] Rex device was brought onto the field and 2 passes were made in the popliteal artery stopping just above the origin of the anterior tibial artery to avoid damaging the smaller vessels.  After mechanical thrombectomy was performed, there remained a significant stenosis in the mid popliteal artery of greater than 70% with some disease of the below-knee popliteal artery of greater than 50% as well.  A 4 mm diameter by 10 cm length Lutonix drug-coated angioplasty balloon was inflated to 10 atm for 1 minute.  Completion imaging showed only about 10 to 15% residual stenosis in the popliteal artery and about a 10 to 20% residual stenosis in the tibioperoneal trunk and proximal peroneal artery.  I then turned my attention to the anterior tibial artery.  This was cannulated with a 0.018 advantage wire and the Kumpe catheter crossing the proximal disease that was nearly occlusive after treating the popliteal and tibioperoneal trunk.  The wire was then parked down in the foot.  2 inflations with a 3 mm diameter by 10 cm length angioplasty balloon was performed in the proximal and proximal to mid anterior tibial artery with  both inflations being to 6 atm for 1 minute.  Completion imaging showed about a 20 to 25% residual stenosis in the anterior tibial artery with marked improvement. I elected to terminate the procedure. The sheath was removed and StarClose closure device was deployed in the left femoral artery with excellent hemostatic result. The patient was taken to the recovery room in stable condition having tolerated the procedure well.  Findings:               Aortogram: Normal renal arteries, normal aorta and iliac arteries without significant stenosis             Right lower Extremity: Normal common femoral artery, small profunda femoris artery which was diseased, superficial femoral artery with only mild disease.  The popliteal artery occluded just above the knee and the occlusion continued down through all 3 tibial vessels proximally.  The anterior tibial and peroneal arteries reconstituted in the proximal segment with the posterior tibial artery reconstituting in the midsegment.  All 3 vessels were then continuous after reconstitution.   Disposition:  Patient was taken to the recovery room in stable condition having tolerated the procedure well.  Complications: None  Melinda [name] 10/05/2020 9:42 AM   This note was created with Dragon Medical transcription system. Any errors in dictation are purely unintentional.

## 2020-10-21 NOTE — ED Provider Notes (Signed)
[facility] Department of Emergency Medicine   Code Blue CONSULT NOTE  Chief Complaint: Cardiac arrest/unresponsive   Level V Caveat: Unresponsive  History of present illness: I was contacted by the hospital for a CODE BLUE cardiac arrest upstairs and presented to the patient's bedside.  Upon arrival patient with agonal respirations and minimally responsive.  Per report patient had gone unresponsive with agonal respirations but never lost a pulse.  Blood pressure 518 systolic, blood glucose 841.  Initially patient unresponsive to sternal rub.  After a short amount of time patient would moan to sternal rub and briefly open her eyes however mostly unresponsive with continued agonal type respirations.  ROS: Unable to obtain, Level V caveat  Scheduled Meds:  amiodarone  200 mg Oral BID   amLODipine  5 mg Oral Daily   aspirin EC  81 mg Oral Daily   atorvastatin  80 mg Oral QHS   cholecalciferol  2,000 Units Oral Daily   clopidogrel  75 mg Oral Daily   DULoxetine  60 mg Oral Daily   furosemide  40 mg Intravenous Daily   insulin aspart  0-15 Units Subcutaneous TID WC   insulin aspart  0-5 Units Subcutaneous QHS   insulin glargine  6 Units Subcutaneous QHS   metoprolol tartrate  50 mg Oral BID   oxybutynin  5 mg Oral BID   pantoprazole  40 mg Oral Daily   pregabalin  100 mg Oral TID   Continuous Infusions:  sodium chloride 75 mL/hr at 10/24/2020 1259   [START ON 10/02/2020] ampicillin-sulbactam (UNASYN) IV     ceFEPime (MAXIPIME) IV 2 g (10/07/2020 1741)   metronidazole Stopped (10/07/2020 0303)   norepinephrine     vancomycin Stopped (10/20/20 2146)   PRN Meds:.acetaminophen **OR** acetaminophen, hydrALAZINE, HYDROmorphone (DILAUDID) injection, ketorolac, morphine injection, ondansetron **OR** ondansetron (ZOFRAN) IV, ondansetron (ZOFRAN) IV, tiZANidine Past Medical History:  Diagnosis Date   Anxiety    Atrial fibrillation (HCC)    Atrial flutter ([place])    Coronary  artery disease    a. 06/2014 NSTEMI s/p CABG x 4 (LIMA to LAD, VG to Diag, VG to OM, VG to PDA).   Depression    Diabetic neuropathy (HCC)    GERD (gastroesophageal reflux disease)    HTN (hypertension)    Hyperlipidemia LDL goal <70    IDDM (insulin dependent diabetes mellitus)    Ischemic cardiomyopathy    a. 06/2014 Echo: EF 40-45%, HK of entire inferolateral and inferior myocardium c/w infarct of RCA/LCx, GR2DD, mild MR   Myocardial infarction ([place]) 2016   OA (osteoarthritis) of knee    Obesity    Osteomyelitis ([place])    Peripheral neuropathy    Peripheral vascular disease ([place])    a. 09/2016 Periph Angio: CTO R popliteal, CTO L prox/mid SFA->Med Rx; b. 05/2017 PTA: LSFA (Viabahn covered stent x 2), DEB to L Post Tibial; c. 06/2017 ABI: R 0.44, L 1.05.   Tobacco abuse    Past Surgical History:  Procedure Laterality Date   ABDOMINAL AORTOGRAM W/LOWER EXTREMITY N/A 10/27/2016   Procedure: Abdominal Aortogram w/Lower Extremity;  Surgeon: Nelva Bush, MD;  Location: [place] [facility];  Service: Cardiovascular;  Laterality: N/A;   AMPUTATION TOE Left 10/21/2015   Procedure: AMPUTATION TOE;  Surgeon: Sharlotte Alamo, DPM;  Location: [facility];  Service: Podiatry;  Laterality: Left;   AMPUTATION TOE Left 05/15/2017   Procedure: AMPUTATION LEFT GREAT TOE;  Surgeon: Sharlotte Alamo, DPM;  Location: [facility];  Service: Podiatry;  Laterality: Left;   AORTIC VALVE REPLACEMENT (AVR)/CORONARY ARTERY BYPASS GRAFTING (CABG)     BREAST BIOPSY     CARDIAC CATHETERIZATION  06/2014   95% stenosis mLAD, occlusion ostial OM1, 70% stenosis LCx, 95% stenosis mRCA, EF 45%.   CARDIOVERSION N/A 04/14/2020   Procedure: CARDIOVERSION with TEE;  Surgeon: Minna Merritts, MD;  Location: [facility];  Service: Cardiovascular;  Laterality: N/A;   CARDIOVERSION N/A 05/28/2020   Procedure: CARDIOVERSION;  Surgeon: Minna Merritts, MD;  Location: [facility];  Service: Cardiovascular;  Laterality: N/A;   CORONARY ARTERY BYPASS  GRAFT N/A 06/08/2014   Procedure: CORONARY ARTERY BYPASS GRAFTING (CABG);  Surgeon: Grace Isaac, MD;  Location: [place];  Service: Open Heart Surgery;  Laterality: N/A;  Times 4 using left internal mammary artery to LAD artery and endoscopically harvested bilateral saphenous vein to Obtuse Marginal, Diagonal and Posterior Descending coronary arteries.   CT ABD W & PELVIS WO CM  06/2014   nl liver, gallbladder, spleen, mild diverticular changes, no bowel wall inflammation, appendix nl, no hernia, no other sig abnormalities   GASTRIC ROUX-EN-Y N/A 02/09/2020   Procedure: LAPAROSCOPIC ROUX-EN-Y GASTRIC BYPASS WITH UPPER ENDOSCOPY;  Surgeon: Johnathan Hausen, MD;  Location: [facility];  Service: General;  Laterality: N/A;   LOWER EXTREMITY ANGIOGRAPHY Left 05/23/2017   Procedure: LOWER EXTREMITY ANGIOGRAPHY;  Surgeon: Algernon Huxley, MD;  Location: [place] [facility];  Service: Cardiovascular;  Laterality: Left;   LOWER EXTREMITY ANGIOGRAPHY Left 05/28/2017   Procedure: LOWER EXTREMITY ANGIOGRAPHY;  Surgeon: Algernon Huxley, MD;  Location: [place] [facility];  Service: Cardiovascular;  Laterality: Left;   LOWER EXTREMITY ANGIOGRAPHY Left 12/16/2018   Procedure: LOWER EXTREMITY ANGIOGRAPHY;  Surgeon: Algernon Huxley, MD;  Location: [place] [facility];  Service: Cardiovascular;  Laterality: Left;   LOWER EXTREMITY ANGIOGRAPHY Right 12/23/2018   Procedure: LOWER EXTREMITY ANGIOGRAPHY;  Surgeon: Algernon Huxley, MD;  Location: [place] [facility];  Service: Cardiovascular;  Laterality: Right;   TEE WITHOUT CARDIOVERSION N/A 06/08/2014   Procedure: TRANSESOPHAGEAL ECHOCARDIOGRAM (TEE);  Surgeon: Grace Isaac, MD;  Location: [place];  Service: Open Heart Surgery;  Laterality: N/A;   TEE WITHOUT CARDIOVERSION N/A 04/14/2020   Procedure: TRANSESOPHAGEAL ECHOCARDIOGRAM (TEE);  Surgeon: Minna Merritts, MD;  Location: [facility];  Service: Cardiovascular;  Laterality: N/A;   TOE AMPUTATION Right 12/2011   rt middle toe    UPPER GI ENDOSCOPY N/A 02/09/2020   Procedure: UPPER GI ENDOSCOPY;  Surgeon: Johnathan Hausen, MD;  Location: [facility];  Service: General;  Laterality: N/A;   US ECHOCARDIOGRAPHY  06/2014   EF 50-55%, HK of inf/post/inferolat walls, Ao sclerosis   Social History   Socioeconomic History   Marital status: Widowed    Spouse name: Not on file   Number of children: Not on file   Years of education: Not on file   Highest education level: Not on file  Occupational History   Not on file  Tobacco Use   Smoking status: Former    Packs/day: 0.50    Years: 40.00    Pack years: 20.00    Types: Cigarettes   Smokeless tobacco: Never   Tobacco comments:    started Chantix 04/12/20  Vaping Use   Vaping Use: Never used  Substance and Sexual Activity   Alcohol use: No    Alcohol/week: 0.0 standard drinks   Drug use: Not Currently    Comment: crack   Sexual activity: Not on file  Other Topics Concern   Not on file  Social History Narrative   Not on file   Social Determinants of Health   Financial Resource Strain: Not on file  Food Insecurity: Not on file  Transportation Needs: Not on file  Physical Activity: Not on file  Stress: Not on file  Social Connections: Not on file  Intimate Partner Violence: Not on file   No Known Allergies  Last set of Vital Signs (not current) Vitals:   10/11/2020 1324 10/04/2020 1532  BP: (!) 116/93 108/68  Pulse: (!) 101 96  Resp: 16 18  Temp: 98.7 F (37.1 C) 98.5 F (36.9 C)  SpO2: 94% (!) 89%      Physical Exam  Gen: unresponsive, no response to sternal rub Cardiovascular: Heart rate around 70 bpm, weak radial pulse. Resp: Agonal respirations.  Breath sounds appear to be equal bilaterally.  Patient being assisted with bag valve ventilations Abd: nondistended  Neuro: GCS 3, unresponsive to pain  HEENT: No blood in posterior pharynx Neck: No crepitus  Musculoskeletal: No deformity  Skin: warm  Procedures (when applicable, including  Critical Care time):  INTUBATION Performed by: Harvest Dark  Required items: required blood products, implants, devices, and special equipment available Patient identity confirmed: provided demographic data and hospital-assigned identification number Time out: Immediately prior to procedure a "time out" was called to verify the correct patient, procedure, equipment, support staff and site/side marked as required.  Indications: Airway protection, respiratory failure  Intubation method: S4 Glidescope Laryngoscopy   Preoxygenation: 100% BVM  Sedatives: 20 mg etomidate Paralytic: 100 mg rocuronium  Tube Size: 7.5 cuffed  Post-procedure assessment: chest rise and ETCO2 monitor Breath sounds: equal and absent over the epigastrium Tube secured with: ETT holder  Patient tolerated the procedure well with no immediate complications.    MDM / Assessment and Plan Patient with agonal respirations unresponsive physical stimuli.  Decision was made to intubate the patient.  Patient maintained a pulse throughout and no CPR was required.  Airway was secured with a 7.5 tube.  Patient care was transferred to the providing hospitalist.  CRITICAL CARE Performed by: Harvest Dark   Total critical care time: 20 minutes  Critical care time was exclusive of separately billable procedures and treating other patients.  Critical care was necessary to treat or prevent imminent or life-threatening deterioration.  Critical care was time spent personally by me on the following activities: development of treatment plan with patient and/or surrogate as well as nursing, discussions with consultants, evaluation of patient's response to treatment, examination of patient, obtaining history from patient or surrogate, ordering and performing treatments and interventions, ordering and review of laboratory studies, ordering and review of radiographic studies, pulse oximetry and re-evaluation of patient's  condition.     Harvest Dark, MD 10/11/2020 Karl Bales

## 2020-10-21 NOTE — Progress Notes (Signed)
PROGRESS NOTE    Melinda [name]  ZWC:[id number] DOB: 1955/10/24 DOA: 10/17/2020 PCP: Donnie Coffin, MD  Outpatient Specialists: cardiology, vascular surgery    Brief Narrative:   Melinda [name] is a 65 y.o. female with medical history significant for CKD 3B, insulin-dependent diabetes mellitus, anxiety, depression, hypertension, osteoarthritis, peripheral artery disease, sickle cell trait, tobacco dependency, status post left first toe amputation, right toe amputation, who presents to the emergency department for chief concerns of right heel wound.   She states that the wound started from skin peeling and she pulled it off about 3.5-4 weeks ago.   She follows with wound consult for left 1st toe wound, weekly. She was told by her wound care that she needs to come into the ED for evaluation of the right heel wound. She denies fever. She endorses nausea and vomiting x5 in the last 3 weeks.  She denies diet changes.   Social history: lives by herself. She smokes 1 cigarettes every 2 or 3 days. She denies etoh and recreational drug use. She is retired and formerly worked as a Programmer, applications   She endorses chronic shortness of breath that is unchanged over the last 5 years.   Assessment & Plan:   Principal Problem:   Acute osteomyelitis of calcaneum, right (HCC) Active Problems:   Essential hypertension   S/P CABG x 4   Obesity   Hyperglycemia   Amputated toe, left (HCC)   Amputated toe, right (HCC)   S/P gastric bypass   CKD (chronic kidney disease), stage IIIa   Depression   AF (paroxysmal atrial fibrillation) (HCC)   Wound infection   Sepsis (HCC)   HFrEF (heart failure with reduced ejection fraction) ([place])   # Diabetic foot infection # osteomyelitis right heel # Sepsis Mild sepsis, hemodynamically stable. Mri shows calcaneal osteo. Hx severe PAD - continue vanc and cefepime, added flagyl - podiatry following, plan for debridement 6/24 - vascular surgery  consulted, performed multi-vessel angioplasty in the right leg today - f/u blood cultures - will consult ID - PT consult after debridement tomorrow  # HFrEF # Hypoxia Patient says breathing is at baseline. Here o2 87 on room air. EF 30-35 in April of this year. Cxr w/ b/l pulmonary edema. Followed by Dr. Arvid Right.  - [place] O2 - continue lasix 40 iv qd - strict I/os - continue metop  # Hypokalemia 2.5 on admission. Resolved w/ repletion - monitor  # HTN BP wnl - cont home amodipine, metoprolol  # A fib/flutter Rate controlled. Followed by cardiology and EP. Outpatient for ablation procedure - cont home amio, metop, diltiazem - hold eliquis, started heparin gtt here, will plan on resuming eliquis after debridement tomorrow  # CAD # PAD S/p 4 vessel cabg and multiple vascular interventions - cont home statin, apixaban. Not on aspirin at home  # T2DM - SSI and home lantus; hold home ozempic  # CKD3 At baseline   DVT prophylaxis: heparin gtt Code Status: full Family Communication: sister updated @ bedside 6/23  Level of care: Med-Surg Status is: inpt  The patient will require care spanning > 2 midnights and should be moved to inpatient because: Inpatient level of care appropriate due to severity of illness  Dispo: The patient is from: Home              Anticipated d/c is to:  tbd              Patient currently is not  medically stable to d/c.   Difficult to place patient No    Consultants:  Podiatry, vascular surgery  Procedures: none  Antimicrobials:  Vanc/cefepime 6/21 Vanc/cevepime/flagyl 6/22>    Subjective: This morning no pain or fever, no chest pain, denies sob or cough.   Objective: Vitals:   10/11/2020 1155 10/28/2020 1200 09/29/2020 1215 10/05/2020 1324  BP:  135/78 118/73 (!) 116/93  Pulse: (!) 102 (!) 102 (!) 102 (!) 101  Resp: 15 12 14 16   Temp:   (!) 97 F (36.1 C) 98.7 F (37.1 C)  TempSrc:      SpO2: 92% 93% 94%   Weight:      Height:         Intake/Output Summary (Last 24 hours) at 10/24/2020 1340 Last data filed at 10/06/2020 1200 Gross per 24 hour  Intake 2562.9 ml  Output 820 ml  Net 1742.9 ml   Filed Weights   10/03/2020 1416 10/24/2020 0801  Weight: 110.7 kg 110.7 kg    Examination:  General exam: Appears calm and comfortable  Respiratory system: few rales @ bases, otherwise clear Cardiovascular system: S1 & S2 heard, RRR. No JVD, murmurs, rubs, gallops or clicks.  Gastrointestinal system: Abdomen is nondistended, soft and nontender. No organomegaly or masses felt. Normal bowel sounds heard. Central nervous system: Alert and oriented. No focal neurological deficits. Extremities: Symmetric 5 x 5 power. Right foot is bandaged. Toes are cool Skin: bandaged right heel Psychiatry: Judgement and insight appear normal. Mood & affect appropriate.     Data Reviewed: I have personally reviewed following labs and imaging studies  CBC: Recent Labs  Lab 10/28/2020 1419 10/20/20 0930 10/04/2020 0519  WBC 14.7* 15.0* 13.6*  NEUTROABS 12.0*  --   --   HGB 10.0* 9.7* 9.5*  HCT 29.8* 29.0* 27.6*  MCV 77.2* 76.7* 74.8*  PLT 351 342 355   Basic Metabolic Panel: Recent Labs  Lab 10/18/2020 1419 10/20/20 0924 10/20/20 0930 09/29/2020 0519  NA 138  --  139 143  K 2.5*  --  3.2* 3.7  CL 98  --  100 105  CO2 31  --  31 32  GLUCOSE 128*  --  158* 134*  BUN 11  --  12 14  CREATININE 1.13*  --  1.10* 1.16*  CALCIUM 7.7*  --  7.9* 8.1*  MG  --  1.4*  --  2.0   GFR: Estimated Creatinine Clearance: 61.4 mL/min (A) (by C-G formula based on SCr of 1.16 mg/dL (H)). Liver Function Tests: Recent Labs  Lab 10/12/2020 1419  AST 19  ALT 17  ALKPHOS 140*  BILITOT 1.5*  PROT 6.1*  ALBUMIN 2.1*   No results for input(s): LIPASE, AMYLASE in the last 168 hours. No results for input(s): AMMONIA in the last 168 hours. Coagulation Profile: Recent Labs  Lab 10/27/2020 1649 10/20/20 0930  INR 2.0* 1.9*   Cardiac Enzymes: No  results for input(s): CKTOTAL, CKMB, CKMBINDEX, TROPONINI in the last 168 hours. BNP (last 3 results) No results for input(s): PROBNP in the last 8760 hours. HbA1C: Recent Labs    10/20/20 0924  HGBA1C 7.5*   CBG: Recent Labs  Lab 10/20/20 1241 10/20/20 1535 10/20/20 2053 10/16/2020 0747 10/10/2020 1005  GLUCAP 141* 193* 147* 126* 152*   Lipid Profile: No results for input(s): CHOL, HDL, LDLCALC, TRIG, CHOLHDL, LDLDIRECT in the last 72 hours. Thyroid Function Tests: No results for input(s): TSH, T4TOTAL, FREET4, T3FREE, THYROIDAB in the last 72 hours. Anemia Panel: No  results for input(s): VITAMINB12, FOLATE, FERRITIN, TIBC, IRON, RETICCTPCT in the last 72 hours. Urine analysis:    Component Value Date/Time   COLORURINE YELLOW (A) 09/30/2020 1649   APPEARANCEUR HAZY (A) 10/24/2020 1649   APPEARANCEUR Clear 10/10/2016 1911   LABSPEC 1.014 10/05/2020 1649   LABSPEC 1.031 06/01/2014 2258   PHURINE 5.0 10/08/2020 1649   GLUCOSEU NEGATIVE 10/23/2020 1649   GLUCOSEU 50 mg/dL 06/01/2014 2258   HGBUR NEGATIVE 10/08/2020 1649   BILIRUBINUR NEGATIVE 10/06/2020 1649   BILIRUBINUR Negative 10/10/2016 1911   BILIRUBINUR Negative 06/01/2014 2258   KETONESUR NEGATIVE 10/12/2020 1649   PROTEINUR NEGATIVE 10/01/2020 1649   UROBILINOGEN 1.0 06/07/2014 0952   NITRITE NEGATIVE 10/24/2020 1649   LEUKOCYTESUR NEGATIVE 10/04/2020 1649   LEUKOCYTESUR Negative 06/01/2014 2258   Sepsis Labs: @LABRCNTIP (procalcitonin:4,lacticidven:4)  ) Recent Results (from the past 240 hour(s))  Resp Panel by RT-PCR (Flu A&B, Covid) Nasopharyngeal Swab     Status: None   Collection Time: 10/18/2020  4:49 PM   Specimen: Nasopharyngeal Swab; Nasopharyngeal(NP) swabs in vial transport medium  Result Value Ref Range Status   SARS Coronavirus 2 by RT PCR NEGATIVE NEGATIVE Final    Comment: (NOTE) SARS-CoV-2 target nucleic acids are NOT DETECTED.  The SARS-CoV-2 RNA is generally detectable in upper  respiratory specimens during the acute phase of infection. The lowest concentration of SARS-CoV-2 viral copies this assay can detect is 138 copies/mL. A negative result does not preclude SARS-Cov-2 infection and should not be used as the sole basis for treatment or other patient management decisions. A negative result may occur with  improper specimen collection/handling, submission of specimen other than nasopharyngeal swab, presence of viral mutation(s) within the areas targeted by this assay, and inadequate number of viral copies(<138 copies/mL). A negative result must be combined with clinical observations, patient history, and epidemiological information. The expected result is Negative.  Fact Sheet for Patients:  EntrepreneurPulse.com.au  Fact Sheet for Healthcare Providers:  IncredibleEmployment.be  This test is no t yet approved or cleared by the [place] FDA and  has been authorized for detection and/or diagnosis of SARS-CoV-2 by FDA under an Emergency Use Authorization (EUA). This EUA will remain  in effect (meaning this test can be used) for the duration of the COVID-19 declaration under Section 564(b)(1) of the Act, 21 U.S.C.section 360bbb-3(b)(1), unless the authorization is terminated  or revoked sooner.       Influenza A by PCR NEGATIVE NEGATIVE Final   Influenza B by PCR NEGATIVE NEGATIVE Final    Comment: (NOTE) The Xpert Xpress SARS-CoV-2/FLU/RSV plus assay is intended as an aid in the diagnosis of influenza from Nasopharyngeal swab specimens and should not be used as a sole basis for treatment. Nasal washings and aspirates are unacceptable for Xpert Xpress SARS-CoV-2/FLU/RSV testing.  Fact Sheet for Patients: EntrepreneurPulse.com.au  Fact Sheet for Healthcare Providers: IncredibleEmployment.be  This test is not yet approved or cleared by the [place] FDA and has been  authorized for detection and/or diagnosis of SARS-CoV-2 by FDA under an Emergency Use Authorization (EUA). This EUA will remain in effect (meaning this test can be used) for the duration of the COVID-19 declaration under Section 564(b)(1) of the Act, 21 U.S.C. section 360bbb-3(b)(1), unless the authorization is terminated or revoked.  Performed at [facility], [address] [place], [place] [address]   Blood Culture (routine x 2)     Status: None (Preliminary result)   Collection Time: 10/16/2020  4:49 PM   Specimen: BLOOD  Result Value Ref Range Status   Specimen Description BLOOD RIGHT ANTECUBITAL  Final   Special Requests   Final    BOTTLES DRAWN AEROBIC AND ANAEROBIC Blood Culture adequate volume   Culture   Final    NO GROWTH 2 DAYS Performed at [facility], [address], [place], [place] [address]    Report Status PENDING  Incomplete  Blood Culture (routine x 2)     Status: None (Preliminary result)   Collection Time: 10/20/2020  5:15 PM   Specimen: BLOOD  Result Value Ref Range Status   Specimen Description BLOOD LEFT ANTECUBITAL  Final   Special Requests   Final    BOTTLES DRAWN AEROBIC AND ANAEROBIC Blood Culture adequate volume   Culture   Final    NO GROWTH 2 DAYS Performed at [facility], [address] [place], [place] [address]    Report Status PENDING  Incomplete         Radiology Studies: DG Os Calcis Right  Result Date: 10/02/2020 CLINICAL DATA:  Open wound heel EXAM: RIGHT OS CALCIS - 2+ VIEW COMPARISON:  02/18/2019 FINDINGS: Negative for fracture or osteomyelitis. Mild calcaneal spurring.  Mild arterial calcification. IMPRESSION: No acute skeletal abnormality. Electronically Signed   By: Franchot Gallo M.D.   On: 10/18/2020 16:24   MR ANKLE RIGHT WO CONTRAST  Result Date: 10/20/2020 CLINICAL DATA:  Osteomyelitis suspected, ankle, xray done Necrotic heel ulcer. Suspect osteomyelitis. EXAM: MRI OF THE RIGHT ANKLE  WITHOUT CONTRAST TECHNIQUE: Multiplanar, multisequence MR imaging of the ankle was performed. No intravenous contrast was administered. COMPARISON:  Radiograph 09/29/2020 FINDINGS: TENDONS Peroneal: There is focal mild tenosynovitis of the peroneal longus and brevis tendons just below the lateral malleolus. There is intramuscular edema proximally. Posteromedial: Posterior tibial tendon intact. Flexor digitorum longus tendon intact. Flexor hallucis longus tendon intact. Anterior: Tibialis anterior tendon intact. Extensor hallucis longus tendon intact Extensor digitorum longus tendon intact. Achilles:  Intact. Plantar Fascia: Intact. LIGAMENTS Lateral: Anterior talofibular ligament intact. Calcaneofibular ligament intact. Posterior talofibular ligament intact. Anterior and posterior tibiofibular ligaments intact. Medial: Deltoid ligament intact. Spring ligament intact. CARTILAGE Ankle Joint: No joint effusion. Normal ankle mortise. No chondral defect. There is mild midfoot arthritis. Subtalar Joints/Sinus Tarsi: Normal subtalar joints. No subtalar joint effusion. Normal sinus tarsi. Bones: There is bony edema in some associated low T1 signal along the lateral inferior calcaneus in likely cortical disruption. This is underlying a soft tissue ulcer with surrounding edema and subcutaneous gas. Soft Tissue: Lateral hindfoot ulcer as above. There is no well-defined soft tissue abscess. Extensive intramuscular and subcutaneous edema in the foot. IMPRESSION: Lateral hindfoot ulcer with underlying osteomyelitis of the inferolateral calcaneus, subcutaneous soft tissue gas and edema. No no well-defined soft tissue abscess. Focal mild tenosynovitis of the peroneal longus and brevis tendons just below the lateral malleolus. Electronically Signed   By: Maurine Simmering   On: 10/20/2020 07:19   PERIPHERAL VASCULAR CATHETERIZATION  Result Date: 10/24/2020 See surgical note for result.  DG Chest Port 1 View  Result Date:  10/20/2020 CLINICAL DATA:  Hypoxia. EXAM: PORTABLE CHEST 1 VIEW COMPARISON:  10/07/2020. FINDINGS: Prior CABG. Cardiomegaly. Diffuse bilateral pulmonary infiltrates/edema. No pleural effusion or pneumothorax. Degenerative change thoracic spine. IMPRESSION: 1.  Prior CABG.  Cardiomegaly. 2.  Diffuse bilateral pulmonary infiltrates/edema. Electronically Signed   By: Valencia West   On: 10/20/2020 10:26        Scheduled Meds:  amiodarone  200 mg Oral BID   amLODipine  5 mg Oral Daily   aspirin EC  81 mg Oral Daily   atorvastatin  80 mg Oral QHS   cholecalciferol  2,000 Units Oral Daily   clopidogrel  75 mg Oral Daily   DULoxetine  60 mg Oral Daily   furosemide  40 mg Intravenous Daily   insulin aspart  0-15 Units Subcutaneous TID WC   insulin aspart  0-5 Units Subcutaneous QHS   insulin glargine  6 Units Subcutaneous QHS   metoprolol tartrate  50 mg Oral BID   oxybutynin  5 mg Oral BID   pantoprazole  40 mg Oral Daily   pregabalin  100 mg Oral TID   ceFAZolin (ANCEF) IVPB 1 gram/100 mL NS (Mini-Bag Plus)       Continuous Infusions:  sodium chloride 75 mL/hr at 10/20/2020 1259   ceFEPime (MAXIPIME) IV Stopped (09/30/2020 0038)   metronidazole Stopped (10/20/2020 0303)   vancomycin Stopped (10/20/20 2146)     LOS: 1 day    Time spent: 3 min    Desma Maxim, MD [facility]   If 7PM-7AM, please contact night-coverage [email or web address] Password [email or web address] 10/14/2020, 1:40 PM

## 2020-10-21 NOTE — Procedures (Signed)
Arterial Catheter Insertion Procedure Note  Kasara Schomer  [id number]  Sep 04, 1955  Date:10/10/2020  Time:9:42 PM    Provider Performing: Chika Cichowski Rodman Pickle    Procedure: Insertion of Arterial Line [id number]) with US guidance (70141)   Indication(s) Blood pressure monitoring and/or need for frequent ABGs  Consent Unable to obtain consent due to emergent nature of procedure.  Anesthesia None   Time Out Verified patient identification, verified procedure, site/side was marked, verified correct patient position, special equipment/implants available, medications/allergies/relevant history reviewed, required imaging and test results available.   Sterile Technique Maximal sterile technique was not able to be achieved due to emergent nature of procedure. Sterile gloves, drape, mask and ultrasound cover were used.  Procedure Description Area of catheter insertion was cleaned with chlorhexidine and draped in sterile fashion. With real-time ultrasound guidance an arterial catheter was placed into the right radial artery.  Appropriate arterial tracings confirmed on monitor.     Complications/Tolerance None; patient tolerated the procedure well.   EBL Minimal   Specimen(s) None  Rodman Pickle, M.D. [facility] Pulmonary/Critical Care Medicine 10/01/2020 9:43 PM

## 2020-10-21 NOTE — Progress Notes (Signed)
Daily Progress Note   Subjective  - Day of Surgery  Follow-up right heel ulceration.  Vascular surgery was able to increase circulation as she had occlusion of all 3 vessels.  Still poor circulation per vascular surgery and limb salvage is questionable.  Objective Vitals:   10/13/2020 1155 10/24/2020 1200 10/23/2020 1215 10/11/2020 1324  BP:  135/78 118/73 (!) 116/93  Pulse: (!) 102 (!) 102 (!) 102 (!) 101  Resp: 15 12 14 16   Temp:   (!) 97 F (36.1 C) 98.7 F (37.1 C)  TempSrc:      SpO2: 92% 93% 94%   Weight:      Height:        Physical Exam: Posterior lateral right heel ulceration is necrotic with foul odor at this time.  Laboratory CBC    Component Value Date/Time   WBC 13.6 (H) 10/14/2020 0519   HGB 9.5 (L) 10/08/2020 0519   HGB 13.1 10/10/2016 1911   HCT 27.6 (L) 10/12/2020 0519   HCT 40.6 10/10/2016 1911   PLT 340 10/28/2020 0519   PLT 301 10/10/2016 1911    BMET    Component Value Date/Time   NA 143 10/24/2020 0519   NA 139 10/10/2016 1911   NA 140 06/03/2014 0405   K 3.7 10/16/2020 0519   K 4.1 06/03/2014 0405   CL 105 10/04/2020 0519   CL 107 06/03/2014 0405   CO2 32 10/23/2020 0519   CO2 26 06/03/2014 0405   GLUCOSE 134 (H) 10/05/2020 0519   GLUCOSE 227 (H) 06/03/2014 0405   BUN 14 10/11/2020 0519   BUN 16 10/10/2016 1911   BUN 13 06/03/2014 0405   CREATININE 1.16 (H) 09/29/2020 0519   CREATININE 1.13 06/03/2014 0405   CALCIUM 8.1 (L) 10/04/2020 0519   CALCIUM 8.1 (L) 06/03/2014 0405   GFRNONAA 52 (L) 10/25/2020 0519   GFRNONAA 52 (L) 06/03/2014 0405   GFRNONAA 54 (L) 11/28/2013 1754   GFRAA 51 (L) 02/02/2020 1110   GFRAA >60 06/03/2014 0405   GFRAA >60 11/28/2013 1754    Assessment/Planning: Osteomyelitis with necrotic ulcer posterior lateral right heel Severe peripheral vascular disease 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  Infected to the  calcaneus as well.  I discussed attempting limb salvage with debridement and wound VAC placement versus below the knee amputation.  I discussed this is questionable that her circulation is still not great even though she had revascularization performed today.  I discussed the possibility of failure of debridement and the need for below the knee amputation down the road.  She would like to do everything she can to salvage the lower extremity.  I would recommend debridement of all of the necrotic tissue and debridement of the bone.  Will likely place wound VAC at that time.  We will continue to monitor.  We will continue to follow through the weekend.  I discussed if this wound does not seem to be healing or continues to stay infected below the knee amputation is her next viable option.  Expressed understanding.  Patient was seen with her sister present as well.   Samara Deist A  10/12/2020, 1:26 PM

## 2020-10-21 NOTE — Procedures (Signed)
Cardiopulmonary Resuscitation Note  Melinda [name]  [id number]  09/09/1955  Date:10/04/2020  Time:9:40 PM   Provider Performing:Enis Leatherwood Rodman Pickle   Procedure: Cardiopulmonary Resuscitation [phone])  Indication(s) Loss of Pulse Ventricular tachycardia  Consent N/A  Anesthesia N/A   Time Out N/A   Sterile Technique Hand hygiene, gloves   Procedure Description Called to patient's room for CODE BLUE. Initial rhythm was Vfib/Vtach. Patient received high quality chest compressions for 5 minutes with defibrillation or cardioversion when appropriate. Epinephrine was administered every 3 minutes as directed by time Therapist, nutritional. Additional pharmacologic interventions included amiodarone, atropine, and sodium bicarbonate. Post code epi gtt and amio gtt started. Additional procedural interventions include arterial line.  Return of spontaneous circulation was achieved.  Family at bedside.   Complications/Tolerance N/A   EBL N/A   Specimen(s) N/A  Estimated time to ROSC: 5 minutes

## 2020-10-21 NOTE — Consult Note (Signed)
NAME:  Melinda [name], MRN:  [id number], DOB:  08-10-1955, LOS: 1 ADMISSION DATE:  10/06/2020, CONSULTATION DATE:  10/09/2020 REFERRING MD:  Judd Gaudier, MD CHIEF COMPLAINT:  Respiratory arrest, circulatory shock   History of Present Illness:  65 year old female with CAD s/p CABG, chronic systolic heart failure  EF 30-35%, PVD s/p stents in left SFA, atrial fibrillation, stage III CKD admitted to hospitalist for sepsis secondary to OM of right heel  Underwent right lower extremity angiography on 6/23. Post-procedure no immediate complications however this evening she complained of chest/abdominal pain. She then went into respiratory arrest and code blue called and she was intubated and transferred to the ICU. On arrival to the ICU she was noted to be in ventricular tachycardia without a pulse. CPR was initiated and ROSC was achieved after 5 minutes. She received epi, sodium bicarb, amiodarone. Levophed was started for hypotension. She then developed bradycardia and received atropine 1 mg with some response. She continued to have intermittent episodes of bradycardia so epi gtt was added.  Family at bedside. Updated on her critical condition.  Pertinent  Medical History  CAD s/p CABG, chronic systolic heart failure EF 30-35%, PVD s/p stents in left SFA, atrial fibrillation, stage III CKD   Significant Hospital Events: Including procedures, antibiotic start and stop dates in addition to other pertinent events   6/21  Interim History / Subjective:  As above  Objective   Blood pressure 108/68, pulse 96, temperature 98.5 F (36.9 C), temperature source Oral, resp. rate 18, height 5\' 8"  (1.727 m), weight 110.7 kg, SpO2 (!) 89 %.        Intake/Output Summary (Last 24 hours) at 10/04/2020 1949 Last data filed at 10/13/2020 1200 Gross per 24 hour  Intake 2322.9 ml  Output 820 ml  Net 1502.9 ml   Filed Weights   10/07/2020 1416 10/06/2020 0801  Weight: 110.7 kg 110.7 kg   Physical  Exam: General: Critically ill-appearing, no acute distress HENT: [place], AT, ETT in place Eyes: EOMI, no scleral icterus Respiratory: Diminished breath sounds bilaterally.  No crackles, wheezing or rales Cardiovascular: RRR, -M/R/G, no JVD GI: BS+, soft, nontender Extremities:-Edema,-tenderness, s/p multiple left toe amputation, right heel ulcer Neuro: Unresponsive, no withdrawal GU: Foley in place  Labs/imaging that I have personally reviewed  (right click and "Reselect all SmartList Selections" daily)   ABG 7.19/44/63  Na 137 K 4.7 CO2 17 BUN/Cr 18/1.51  WBC 26.1 Hg 9.0 Plt 337  LA >11  CXR  ETT in place. R IJ CVC over SVC. No pneumothorax. Bilaterally interstitial opacities with worsening bases. Cardiomegaly.  Resolved Hospital Problem list     Assessment & Plan:   Cardiogenic shock VT arrest Hx CABG, chronic systolic heart failure Atrial fib/flutter --Telemetry --Heparin gtt --Amiodarone gtt --Normothermia protocol --Wean Epi and levophed gtt for MAP goal >65 --Continue ASA, Plavix, statin --Trend troponin --Echocardiogram --Hold on anti-hypertensive agents and diuretics for now --Cardiology consult in AM. Unlikely cath candidate in setting of hemodynamic instability and CKD  Acute hypoxemic respiratory failure Pulmonary edema --Full vent support: PRVC TV 500 PEEP 10 RR 26 --ABG PRN --SBT/WUA daily when able --VAP --Hold on lasix but will reassess in am  Acute encephalopathy secondary to arrest Concerning for hypoxic injury however too early --Hypothermia protocol --EEG  --Minimize sedating meds  Sepsis/septic shock secondary right foot OM --ID following: Continue Vanc, Unasyn --Repeat cultures --Vascular following  Acute on chronic CKD III - cardiac arrest, recent contrast Lactic acidosis --Monitor  UOP/Cr --Avoid nephrotoxic agents  DM2 --SSI --CBG q4h  Best Practice (right click and "Reselect all SmartList Selections" daily)    Diet/type: NPO Pain/Anxiety/Delirium protocol RASS goal: 0 to -1 VAP protocol (if indicated): Yes DVT prophylaxis: systemic heparin GI prophylaxis: PPI Glucose control:  SSI Central venous access:  Yes, and it is still needed Arterial line:  Yes, and it is still needed Foley:  Yes, and it is still needed Mobility:  bed rest  PT consulted: N/A Studies pending: None Culture data pending:blood Last reviewed culture data:today Antibiotics:unasyn and vanc Antibiotic de-escalation: no,  continue current rx Stop date: to be determined  Code Status:  full code Last date of multidisciplinary goals of care discussion [6/23] Discussed with her sisters at bedside and brother on the phone. We discussed her critical status and family currently discussing decision. Remain full code for now. ccm prognosis: Life-threating Disposition: remains critically ill, will stay in intensive care   Labs   CBC: Recent Labs  Lab 10/06/2020 1419 10/20/20 0930 10/12/2020 0519  WBC 14.7* 15.0* 13.6*  NEUTROABS 12.0*  --   --   HGB 10.0* 9.7* 9.5*  HCT 29.8* 29.0* 27.6*  MCV 77.2* 76.7* 74.8*  PLT 351 342 250    Basic Metabolic Panel: Recent Labs  Lab 10/18/2020 1419 10/20/20 0924 10/20/20 0930 10/24/2020 0519  NA 138  --  139 143  K 2.5*  --  3.2* 3.7  CL 98  --  100 105  CO2 31  --  31 32  GLUCOSE 128*  --  158* 134*  BUN 11  --  12 14  CREATININE 1.13*  --  1.10* 1.16*  CALCIUM 7.7*  --  7.9* 8.1*  MG  --  1.4*  --  2.0   GFR: Estimated Creatinine Clearance: 61.4 mL/min (A) (by C-G formula based on SCr of 1.16 mg/dL (H)). Recent Labs  Lab 10/16/2020 1419 10/04/2020 1649 10/20/20 0924 10/20/20 0930 10/04/2020 0519  PROCALCITON  --   --   --  <0.10  --   WBC 14.7*  --   --  15.0* 13.6*  LATICACIDVEN 2.5* 2.0* 1.7  --   --     Liver Function Tests: Recent Labs  Lab 10/10/2020 1419  AST 19  ALT 17  ALKPHOS 140*  BILITOT 1.5*  PROT 6.1*  ALBUMIN 2.1*   No results for input(s): LIPASE,  AMYLASE in the last 168 hours. No results for input(s): AMMONIA in the last 168 hours.  ABG    Component Value Date/Time   PHART 7.26 (L) 10/18/2020 1905   PCO2ART 46 10/09/2020 1905   PO2ART 54 (L) 10/27/2020 1905   HCO3 20.6 10/07/2020 1905   TCO2 22 06/09/2014 1822   ACIDBASEDEF 6.2 (H) 10/20/2020 1905   O2SAT 82.1 10/10/2020 1905     Coagulation Profile: Recent Labs  Lab 10/08/2020 1649 10/20/20 0930  INR 2.0* 1.9*    Cardiac Enzymes: No results for input(s): CKTOTAL, CKMB, CKMBINDEX, TROPONINI in the last 168 hours.  HbA1C: Hemoglobin A1C  Date/Time Value Ref Range Status  06/02/2014 12:03 AM 11.5 (H) 4.2 - 6.3 % Final    Comment:    The American Diabetes Association recommends that a primary goal of therapy should be <7% and that physicians should reevaluate the treatment regimen in patients with HbA1c values consistently >8%.   01/19/2012 01:52 AM 13.5 (H) 4.2 - 6.3 % Final    Comment:    The American Diabetes Association recommends that a primary  goal of therapy should be <7% and that physicians should reevaluate the treatment regimen in patients with HbA1c values consistently >8%.    Hgb A1c MFr Bld  Date/Time Value Ref Range Status  10/20/2020 09:24 AM 7.5 (H) 4.8 - 5.6 % Final    Comment:    (NOTE) Pre diabetes:          5.7%-6.4%  Diabetes:              >6.4%  Glycemic control for   <7.0% adults with diabetes   05/26/2020 08:24 AM 8.0 (H) 4.8 - 5.6 % Final    Comment:    (NOTE) Pre diabetes:          5.7%-6.4%  Diabetes:              >6.4%  Glycemic control for   <7.0% adults with diabetes     CBG: Recent Labs  Lab 10/20/20 2053 10/20/2020 0747 10/28/2020 1005 10/24/2020 1555 10/10/2020 1920  GLUCAP 147* 126* 152* 217* 212*    Review of Systems:   Unable to obtain due to critical status  Past Medical History:  She,  has a past medical history of Anxiety, Atrial fibrillation ([place]), Atrial flutter ([place]), Coronary artery disease,  Depression, Diabetic neuropathy ([place]), GERD (gastroesophageal reflux disease), HTN (hypertension), Hyperlipidemia LDL goal <70, IDDM (insulin dependent diabetes mellitus), Ischemic cardiomyopathy, Myocardial infarction ([place]) (2016), OA (osteoarthritis) of knee, Obesity, Osteomyelitis ([place]), Peripheral neuropathy, Peripheral vascular disease ([place]), and Tobacco abuse.   Surgical History:   Past Surgical History:  Procedure Laterality Date   ABDOMINAL AORTOGRAM W/LOWER EXTREMITY N/A 10/27/2016   Procedure: Abdominal Aortogram w/Lower Extremity;  Surgeon: Nelva Bush, MD;  Location: [place] [facility];  Service: Cardiovascular;  Laterality: N/A;   AMPUTATION TOE Left 10/21/2015   Procedure: AMPUTATION TOE;  Surgeon: Sharlotte Alamo, DPM;  Location: [facility];  Service: Podiatry;  Laterality: Left;   AMPUTATION TOE Left 05/15/2017   Procedure: AMPUTATION LEFT GREAT TOE;  Surgeon: Sharlotte Alamo, DPM;  Location: [facility];  Service: Podiatry;  Laterality: Left;   AORTIC VALVE REPLACEMENT (AVR)/CORONARY ARTERY BYPASS GRAFTING (CABG)     BREAST BIOPSY     CARDIAC CATHETERIZATION  06/2014   95% stenosis mLAD, occlusion ostial OM1, 70% stenosis LCx, 95% stenosis mRCA, EF 45%.   CARDIOVERSION N/A 04/14/2020   Procedure: CARDIOVERSION with TEE;  Surgeon: Minna Merritts, MD;  Location: [facility];  Service: Cardiovascular;  Laterality: N/A;   CARDIOVERSION N/A 05/28/2020   Procedure: CARDIOVERSION;  Surgeon: Minna Merritts, MD;  Location: [facility];  Service: Cardiovascular;  Laterality: N/A;   CORONARY ARTERY BYPASS GRAFT N/A 06/08/2014   Procedure: CORONARY ARTERY BYPASS GRAFTING (CABG);  Surgeon: Grace Isaac, MD;  Location: [place];  Service: Open Heart Surgery;  Laterality: N/A;  Times 4 using left internal mammary artery to LAD artery and endoscopically harvested bilateral saphenous vein to Obtuse Marginal, Diagonal and Posterior Descending coronary arteries.   CT ABD W & PELVIS WO CM  06/2014   nl liver,  gallbladder, spleen, mild diverticular changes, no bowel wall inflammation, appendix nl, no hernia, no other sig abnormalities   GASTRIC ROUX-EN-Y N/A 02/09/2020   Procedure: LAPAROSCOPIC ROUX-EN-Y GASTRIC BYPASS WITH UPPER ENDOSCOPY;  Surgeon: Johnathan Hausen, MD;  [facility];  Service: General;  Laterality: N/A;   LOWER EXTREMITY ANGIOGRAPHY Left 05/23/2017   Procedure: LOWER EXTREMITY ANGIOGRAPHY;  Surgeon: Algernon Huxley, MD;  Location: [place] [facility];  Service: Cardiovascular;  Laterality:  Left;   LOWER EXTREMITY ANGIOGRAPHY Left 05/28/2017   Procedure: LOWER EXTREMITY ANGIOGRAPHY;  Surgeon: Algernon Huxley, MD;  Location: [place] [facility];  Service: Cardiovascular;  Laterality: Left;   LOWER EXTREMITY ANGIOGRAPHY Left 12/16/2018   Procedure: LOWER EXTREMITY ANGIOGRAPHY;  Surgeon: Algernon Huxley, MD;  Location: [place] [facility];  Service: Cardiovascular;  Laterality: Left;   LOWER EXTREMITY ANGIOGRAPHY Right 12/23/2018   Procedure: LOWER EXTREMITY ANGIOGRAPHY;  Surgeon: Algernon Huxley, MD;  Location: [place] [facility];  Service: Cardiovascular;  Laterality: Right;   TEE WITHOUT CARDIOVERSION N/A 06/08/2014   Procedure: TRANSESOPHAGEAL ECHOCARDIOGRAM (TEE);  Surgeon: Grace Isaac, MD;  Location: [place];  Service: Open Heart Surgery;  Laterality: N/A;   TEE WITHOUT CARDIOVERSION N/A 04/14/2020   Procedure: TRANSESOPHAGEAL ECHOCARDIOGRAM (TEE);  Surgeon: Minna Merritts, MD;  Location: [facility];  Service: Cardiovascular;  Laterality: N/A;   TOE AMPUTATION Right 12/2011   rt middle toe   UPPER GI ENDOSCOPY N/A 02/09/2020   Procedure: UPPER GI ENDOSCOPY;  Surgeon: Johnathan Hausen, MD;  [facility];  Service: General;  Laterality: N/A;   US ECHOCARDIOGRAPHY  06/2014   EF 50-55%, HK of inf/post/inferolat walls, Ao sclerosis     Social History:   reports that she has quit smoking. Her smoking use included cigarettes. She has a 20.00 pack-year smoking history. She has never used  smokeless tobacco. She reports previous drug use. She reports that she does not drink alcohol.   Family History:  Her family history includes Asthma in an other family member; CAD in her father and mother; Diabetes in her father and mother; Hypertension in her mother; Sickle cell trait in an other family member.   Allergies No Known Allergies   Home Medications  Prior to Admission medications   Medication Sig Start Date End Date Taking? Authorizing Provider  amiodarone (PACERONE) 200 MG tablet Take 1 tablet (200 mg total) by mouth 2 (two) times daily. 09/03/20  Yes Hendricks Limes, MD  amLODipine (NORVASC) 5 MG tablet Take 5 mg by mouth daily.   Yes [provider]  apixaban (ELIQUIS) 5 MG TABS tablet Take 1 tablet (5 mg total) by mouth 2 (two) times daily. 10/23/2020  Yes Vickie Epley, MD  atorvastatin (LIPITOR) 80 MG tablet Take 1 tablet (80 mg total) by mouth daily at 6 PM. 09/03/20  Yes Hendricks Limes, MD  Cholecalciferol (VITAMIN D3) 50 MCG (2000 UT) TABS Take 2,000 Units by mouth daily.   Yes [provider]  clotrimazole (LOTRIMIN) 1 % cream Apply 1 application topically 2 (two) times daily as needed.   Yes [provider]  docusate sodium (COLACE) 100 MG capsule Take 100 mg by mouth 2 (two) times daily.   Yes [provider]  DULoxetine (CYMBALTA) 60 MG capsule Take 1 capsule (60 mg total) by mouth daily. 09/03/20  Yes Hendricks Limes, MD  Emollient (CERAVE EX) Apply 1 application topically 2 (two) times daily as needed (dry skin).   Yes [provider]  furosemide (LASIX) 20 MG tablet Take 2 tablets (40 mg total) by mouth daily as needed (fluid retention.). 09/03/20  Yes Hendricks Limes, MD  insulin aspart (NOVOLOG) 100 UNIT/ML injection Inject 4 Units into the skin 3 (three) times daily with meals. 08/25/20  Yes Wieting, Richard, MD  metoprolol tartrate (LOPRESSOR) 50 MG tablet TAKE 1 TABLET TWICE DAILY Patient taking differently: Take  50 mg by mouth 2 (two) times daily. 09/24/20  Yes Vickie Epley, MD  oxybutynin (DITROPAN) 5 MG tablet Take 5 mg by mouth 2 (two) times daily.  05/10/17  Yes [provider]  pantoprazole (PROTONIX) 40 MG tablet Take 1 tablet (40 mg total) by mouth daily. 09/03/20  Yes Hendricks Limes, MD  potassium chloride SA (KLOR-CON) 20 MEQ tablet TAKE 1 TABLET (20 MEQ TOTAL) BY MOUTH DAILY AS NEEDED. Patient taking differently: Take 20 mEq by mouth daily as needed (potassium replacement with furosemide). 09/03/20  Yes Hendricks Limes, MD  pregabalin (LYRICA) 100 MG capsule Take 1 capsule (100 mg total) by mouth 3 (three) times daily. 09/08/20  Yes Medina-Vargas, Monina C, NP  Semaglutide,0.25 or 0.5MG /DOS, (OZEMPIC, 0.25 OR 0.5 MG/DOSE,) 2 MG/1.5ML SOPN Inject 0.5 mg into the skin every Friday.   Yes [provider]  tiZANidine (ZANAFLEX) 4 MG capsule Take 1 capsule (4 mg total) by mouth 3 (three) times daily as needed for muscle spasms. 09/06/20  Yes Medina-Vargas, Monina C, NP  diltiazem (CARDIZEM) 60 MG tablet Take 1 tablet (60 mg total) by mouth 2 (two) times daily. Patient not taking: No sig reported 09/03/20   Hendricks Limes, MD  insulin glargine (LANTUS) 100 UNIT/ML injection Inject 0.06 mLs (6 Units total) into the skin at bedtime. Patient not taking: No sig reported 09/03/20   Hendricks Limes, MD     Critical care time: 120 min     The patient is critically ill with multiple organ systems failure and requires high complexity decision making for assessment and support, frequent evaluation and titration of therapies, application of advanced monitoring technologies and extensive interpretation of multiple databases.    Rodman Pickle, M.D. [facility] Pulmonary/Critical Care Medicine 10/20/2020 11:34 PM   Please see Amion for pager number to reach on-call Pulmonary and Critical Care Team.

## 2020-10-21 NOTE — Transfer of Care (Signed)
Immediate Anesthesia Transfer of Care Note  Patient: Melinda [name]  Procedure(s) Performed: Lower Extremity Angiography (Right)  Patient Location: PACU  Anesthesia Type:General  Level of Consciousness: awake and alert   Airway & Oxygen Therapy: Patient Spontanous Breathing and Patient connected to face mask oxygen  Post-op Assessment: Report given to RN and Post -op Vital signs reviewed and stable  Post vital signs: Reviewed and stable  Last Vitals:  Vitals Value Taken Time  BP 119/79 10/08/2020 1000  Temp    Pulse 100 10/04/2020 1006  Resp 21 10/02/2020 1006  SpO2 89 % 10/17/2020 1006  Vitals shown include unvalidated device data.  Last Pain:  Vitals:   10/16/2020 0801  TempSrc: Oral  PainSc: 0-No pain         Complications: No notable events documented.

## 2020-10-21 NOTE — Progress Notes (Signed)
RN found pt w/ new onset respiratory distress with nasal cannula removed in bed. Pt placed on non-rebreather at 12L O2. Respiratory therapy called to bedside and MD notified. Tele monitor in place with continuous pulse-ox on. Rapid response called within 5 minutes of Respiratory therapy and STAT orders were placed by MD. RN carried out orders as placed with Rapid team at bedside. Code Blue called when pt continued to decline and pt was transferred to ICU bed 4. Report given at bedside by this RN to [place], [place].

## 2020-10-21 NOTE — Anesthesia Preprocedure Evaluation (Addendum)
Anesthesia Evaluation  Patient identified by MRN, date of birth, ID band Patient awake    Reviewed: Allergy & Precautions, H&P , NPO status , Patient's Chart, lab work & pertinent test results, reviewed documented beta blocker date and time   History of Anesthesia Complications Negative for: history of anesthetic complications  Airway Mallampati: II   Neck ROM: full    Dental  (+) Poor Dentition, Partial Upper, Dental Advidsory Given   Pulmonary shortness of breath (worsened by a fib) and with exertion, neg COPD, neg recent URI, Current Smoker and Patient abstained from smoking., former smoker,    Pulmonary exam normal        Cardiovascular Exercise Tolerance: Poor hypertension, On Medications (-) angina+ CAD, + Past MI, + CABG, + Peripheral Vascular Disease and +CHF  + dysrhythmias Atrial Fibrillation (-) Valvular Problems/Murmurs Rhythm:regular Rate:Normal     Neuro/Psych neg Seizures PSYCHIATRIC DISORDERS Anxiety Depression  Neuromuscular disease    GI/Hepatic Neg liver ROS, GERD  Medicated,  Endo/Other  negative endocrine ROSdiabetes, Type 1, Insulin Dependent  Renal/GU negative Renal ROS  negative genitourinary   Musculoskeletal  (+) Arthritis , Osteoarthritis,    Abdominal   Peds  Hematology negative hematology ROS (+)   Anesthesia Other Findings 1. Left ventricular ejection fraction, by estimation, is 30 to 35%. The  left ventricle has moderately decreased function. The left ventricle  demonstrates global hypokinesis. Left ventricular diastolic parameters are  indeterminate. Anxiety Atrial fibrillation (HCC) Atrial flutter (HCC) Coronary artery disease     Comment:  a. 06/2014 NSTEMI s/p CABG x 4 (LIMA to LAD, VG to Diag,               VG to OM, VG to PDA). Depression Diabetic neuropathy (HCC) GERD (gastroesophageal reflux disease) HTN (hypertension) Hyperlipidemia LDL goal <70 IDDM (insulin dependent  diabetes mellitus) Ischemic cardiomyopathy     Comment:  a. 06/2014 Echo: EF 40-45%, HK of entire inferolateral               and inferior myocardium c/w infarct of RCA/LCx, GR2DD,               mild MR 2016: Myocardial infarction ([place]) OA (osteoarthritis) of knee Obesity Osteoarthritis Osteomyelitis ([place]) Peripheral neuropathy Peripheral vascular disease ([place])     Comment:  a. 09/2016 Periph Angio: CTO R popliteal, CTO L prox/mid               SFA->Med Rx; b. 05/2017 PTA: LSFA (Viabahn covered stent x              2), DEB to L Post Tibial; c. 06/2017 ABI: R 0.44, L 1.05. Tobacco abuse Past Surgical History: 10/27/2016: ABDOMINAL AORTOGRAM W/LOWER EXTREMITY; N/A     Comment:  Procedure: Abdominal Aortogram w/Lower Extremity;                Surgeon: Nelva Bush, MD;  Location: [place] [facility]               [facility];  Service: Cardiovascular;  Laterality: N/A; 10/21/2015: AMPUTATION TOE; Left     Comment:  Procedure: AMPUTATION TOE;  Surgeon: Sharlotte Alamo, DPM;                Location: [facility];  Service: Podiatry;  Laterality:               Left; 05/15/2017: AMPUTATION TOE; Left     Comment:  Procedure: AMPUTATION LEFT GREAT TOE;  Surgeon: Cleda Mccreedy,  Sherren Mocha, DPM;  Location: [facility];  Service: Podiatry;                Laterality: Left; No date: AORTIC VALVE REPLACEMENT (AVR)/CORONARY ARTERY BYPASS  GRAFTING (CABG) No date: BREAST BIOPSY 06/2014: CARDIAC CATHETERIZATION     Comment:  95% stenosis mLAD, occlusion ostial OM1, 70% stenosis               LCx, 95% stenosis mRCA, EF 45%. 06/08/2014: CORONARY ARTERY BYPASS GRAFT; N/A     Comment:  Procedure: CORONARY ARTERY BYPASS GRAFTING (CABG);                Surgeon: Grace Isaac, MD;  Location: [place];                Service: Open Heart Surgery;  Laterality: N/A;  Times 4               using left internal mammary artery to LAD artery and               endoscopically harvested bilateral saphenous vein to               Obtuse  Marginal, Diagonal and Posterior Descending               coronary arteries. 06/2014: CT ABD W & PELVIS WO CM     Comment:  nl liver, gallbladder, spleen, mild diverticular               changes, no bowel wall inflammation, appendix nl, no               hernia, no other sig abnormalities 02/09/2020: GASTRIC ROUX-EN-Y; N/A     Comment:  Procedure: LAPAROSCOPIC ROUX-EN-Y GASTRIC BYPASS WITH               UPPER ENDOSCOPY;  Surgeon: Johnathan Hausen, MD;                [facility];  Service: General;  Laterality: N/A; 05/23/2017: LOWER EXTREMITY ANGIOGRAPHY; Left     Comment:  Procedure: LOWER EXTREMITY ANGIOGRAPHY;  Surgeon: Algernon Huxley, MD;  Location: [place] [facility];  Service:               Cardiovascular;  Laterality: Left; 05/28/2017: LOWER EXTREMITY ANGIOGRAPHY; Left     Comment:  Procedure: LOWER EXTREMITY ANGIOGRAPHY;  Surgeon: Algernon Huxley, MD;  Location: [place] [facility];  Service:               Cardiovascular;  Laterality: Left; 12/16/2018: LOWER EXTREMITY ANGIOGRAPHY; Left     Comment:  Procedure: LOWER EXTREMITY ANGIOGRAPHY;  Surgeon: Algernon Huxley, MD;  Location: [place] [facility];  Service:               Cardiovascular;  Laterality: Left; 12/23/2018: LOWER EXTREMITY ANGIOGRAPHY; Right     Comment:  Procedure: LOWER EXTREMITY ANGIOGRAPHY;  Surgeon: Algernon Huxley, MD;  Location: [place] [facility];  Service:               Cardiovascular;  Laterality: Right; 06/08/2014: TEE WITHOUT CARDIOVERSION; N/A  Comment:  Procedure: TRANSESOPHAGEAL ECHOCARDIOGRAM (TEE);                Surgeon: Grace Isaac, MD;  Location: [place];                Service: Open Heart Surgery;  Laterality: N/A; 12/2011: TOE AMPUTATION; Right     Comment:  rt middle toe 02/09/2020: UPPER GI ENDOSCOPY; N/A     Comment:  Procedure: UPPER GI ENDOSCOPY;  Surgeon: Johnathan Hausen, MD;  [facility];  Service: General;                 Laterality: N/A; 06/2014: US ECHOCARDIOGRAPHY     Comment:  EF 50-55%, HK of inf/post/inferolat walls, Ao sclerosis   Reproductive/Obstetrics negative OB ROS                             Anesthesia Physical  Anesthesia Plan  ASA: IV  Anesthesia Plan: General   Post-op Pain Management:    Induction: Intravenous  PONV Risk Score and Plan: 3 and Propofol infusion and TIVA  Airway Management Planned: LMA and Oral ETT  Additional Equipment:   Intra-op Plan:   Post-operative Plan: Extubation in OR  Informed Consent: I have reviewed the patients History and Physical, chart, labs and discussed the procedure including the risks, benefits and alternatives for the proposed anesthesia with the patient or authorized representative who has indicated his/her understanding and acceptance.     Dental Advisory Given  Plan Discussed with: CRNA, Anesthesiologist and Surgeon  Anesthesia Plan Comments:       Anesthesia Quick Evaluation

## 2020-10-21 NOTE — Significant Event (Signed)
Rapid Response Event Note   Reason for Call :  Chest pain  Initial Focused Assessment:  Patient complaint of chest pain per bedside RN- patient on non -re breather mask  stating she had pain in her belly.   Interventions: Vitals stable though patient tachypneic-- the MD was notified by the RN- orders were placed for lasix and chest xray. EKG was performed- then patient started agonal breathing- we called a code blue to get a MD by the bedside- for the respiratory distress- patient never lost a pulse- hospitalist and ED MD at bedside.  Decision was made to intubate patient.      Plan of Care: Patient then brought to the ICU.    Event Summary:   MD Notified: Dr. Cyndra Numbers. Damita Dunnings Call Time: 18:45 Arrival Time: 17:05 End Time: 17:30  Penne Lash, RN

## 2020-10-21 NOTE — Progress Notes (Signed)
   10/18/2020 1910  Clinical Encounter Type  Visited With Patient not available  Visit Type Code  Recommendations will refer to ICU chaplain for next day follow-up  Chaplain Burris returned to Ms. Smotherman's room because Code Blue was called. Daryel November bore witness to care and provided non-anxious presence and silent prayer. Will check-in for family presence this evening and refer for follow-up pastoral care.

## 2020-10-22 ENCOUNTER — Encounter: Payer: Self-pay | Admitting: Certified Registered Nurse Anesthetist

## 2020-10-22 ENCOUNTER — Encounter: Payer: Self-pay | Admitting: Vascular Surgery

## 2020-10-22 ENCOUNTER — Inpatient Hospital Stay: Payer: Medicare HMO

## 2020-10-22 ENCOUNTER — Inpatient Hospital Stay (HOSPITAL_COMMUNITY)
Admit: 2020-10-22 | Discharge: 2020-10-22 | Disposition: A | Discharge status: Home / Self Care | Payer: Medicare HMO | Attending: Pulmonary Disease | Admitting: Pulmonary Disease

## 2020-10-22 ENCOUNTER — Encounter: Admission: EM | Disposition: E | Payer: Self-pay | Source: Home / Self Care | Attending: Internal Medicine

## 2020-10-22 DIAGNOSIS — I4892 Unspecified atrial flutter: Secondary | ICD-10-CM

## 2020-10-22 DIAGNOSIS — I462 Cardiac arrest due to underlying cardiac condition: Secondary | ICD-10-CM

## 2020-10-22 DIAGNOSIS — I5020 Unspecified systolic (congestive) heart failure: Secondary | ICD-10-CM

## 2020-10-22 DIAGNOSIS — I469 Cardiac arrest, cause unspecified: Secondary | ICD-10-CM

## 2020-10-22 DIAGNOSIS — I2510 Atherosclerotic heart disease of native coronary artery without angina pectoris: Secondary | ICD-10-CM

## 2020-10-22 DIAGNOSIS — L089 Local infection of the skin and subcutaneous tissue, unspecified: Secondary | ICD-10-CM

## 2020-10-22 LAB — ECHOCARDIOGRAM COMPLETE
AR max vel: 2.45 cm2
AV Area VTI: 2.69 cm2
AV Area mean vel: 2.36 cm2
AV Mean grad: 3 mmHg
AV Peak grad: 5.9 mmHg
Ao pk vel: 1.21 m/s
Area-P 1/2: 3.6 cm2
Height: 68 in
S' Lateral: 3.65 cm
Weight: 3904.01 oz

## 2020-10-22 LAB — CBC
HCT: 25 % — ABNORMAL LOW (ref 36.0–46.0)
HCT: 26.6 % — ABNORMAL LOW (ref 36.0–46.0)
Hemoglobin: 8.8 g/dL — ABNORMAL LOW (ref 12.0–15.0)
Hemoglobin: 9.1 g/dL — ABNORMAL LOW (ref 12.0–15.0)
MCH: 25.9 pg — ABNORMAL LOW (ref 26.0–34.0)
MCH: 26.4 pg (ref 26.0–34.0)
MCHC: 34.2 g/dL (ref 30.0–36.0)
MCHC: 35.2 g/dL (ref 30.0–36.0)
MCV: 73.5 fL — ABNORMAL LOW (ref 80.0–100.0)
MCV: 77.1 fL — ABNORMAL LOW (ref 80.0–100.0)
Platelets: 345 10*3/uL (ref 150–400)
Platelets: 345 10*3/uL (ref 150–400)
RBC: 3.4 MIL/uL — ABNORMAL LOW (ref 3.87–5.11)
RBC: 3.45 MIL/uL — ABNORMAL LOW (ref 3.87–5.11)
RDW: 19.2 % — ABNORMAL HIGH (ref 11.5–15.5)
RDW: 19.8 % — ABNORMAL HIGH (ref 11.5–15.5)
WBC: 24.9 10*3/uL — ABNORMAL HIGH (ref 4.0–10.5)
WBC: 29 10*3/uL — ABNORMAL HIGH (ref 4.0–10.5)
nRBC: 0.3 % — ABNORMAL HIGH (ref 0.0–0.2)
nRBC: 0.5 % — ABNORMAL HIGH (ref 0.0–0.2)

## 2020-10-22 LAB — GLUCOSE, CAPILLARY
Glucose-Capillary: 204 mg/dL — ABNORMAL HIGH (ref 70–99)
Glucose-Capillary: 173 mg/dL — ABNORMAL HIGH (ref 70–99)
Glucose-Capillary: 200 mg/dL — ABNORMAL HIGH (ref 70–99)
Glucose-Capillary: 220 mg/dL — ABNORMAL HIGH (ref 70–99)
Glucose-Capillary: 257 mg/dL — ABNORMAL HIGH (ref 70–99)
Glucose-Capillary: 182 mg/dL — ABNORMAL HIGH (ref 70–99)

## 2020-10-22 LAB — BLOOD GAS, ARTERIAL
Acid-Base Excess: 1.7 mmol/L (ref 0.0–2.0)
Bicarbonate: 24.2 mmol/L (ref 20.0–28.0)
FIO2: 0.45
MECHVT: 500 mL
O2 Saturation: 98.9 %
PEEP: 10 cmH2O
Patient temperature: 37
RATE: 26 resp/min
pCO2 arterial: 29 mmHg — ABNORMAL LOW (ref 32.0–48.0)
pH, Arterial: 7.53 — ABNORMAL HIGH (ref 7.350–7.450)
pO2, Arterial: 114 mmHg — ABNORMAL HIGH (ref 83.0–108.0)

## 2020-10-22 LAB — BASIC METABOLIC PANEL
Anion gap: 12 (ref 5–15)
BUN: 22 mg/dL (ref 8–23)
CO2: 23 mmol/L (ref 22–32)
Calcium: 7.6 mg/dL — ABNORMAL LOW (ref 8.9–10.3)
Chloride: 105 mmol/L (ref 98–111)
Creatinine, Ser: 1.64 mg/dL — ABNORMAL HIGH (ref 0.44–1.00)
GFR, Estimated: 34 mL/min — ABNORMAL LOW (ref >60)
Glucose, Bld: 210 mg/dL — ABNORMAL HIGH (ref 70–99)
Potassium: 3.6 mmol/L (ref 3.5–5.1)
Sodium: 140 mmol/L (ref 135–145)

## 2020-10-22 LAB — APTT
aPTT: 158 seconds — ABNORMAL HIGH (ref 24–36)
aPTT: >200 seconds (ref 24–36)
aPTT: >200 seconds (ref 24–36)

## 2020-10-22 LAB — LACTIC ACID, PLASMA
Lactic Acid, Venous: 8.3 mmol/L (ref 0.5–1.9)
Lactic Acid, Venous: 5.1 mmol/L (ref 0.5–1.9)
Lactic Acid, Venous: 6 mmol/L (ref 0.5–1.9)
Lactic Acid, Venous: 2.4 mmol/L (ref 0.5–1.9)
Lactic Acid, Venous: 2 mmol/L (ref 0.5–1.9)

## 2020-10-22 LAB — PROCALCITONIN: Procalcitonin: 2.8 ng/mL

## 2020-10-22 LAB — MAGNESIUM
Magnesium: 1.7 mg/dL (ref 1.7–2.4)
Magnesium: 2 mg/dL (ref 1.7–2.4)

## 2020-10-22 LAB — TROPONIN I (HIGH SENSITIVITY)
Troponin I (High Sensitivity): 27 ng/L — ABNORMAL HIGH (ref <18)
Troponin I (High Sensitivity): 46 ng/L — ABNORMAL HIGH (ref <18)
Troponin I (High Sensitivity): 48 ng/L — ABNORMAL HIGH (ref <18)
Troponin I (High Sensitivity): 53 ng/L — ABNORMAL HIGH (ref <18)

## 2020-10-22 LAB — HEPARIN LEVEL (UNFRACTIONATED)
Heparin Unfractionated: 0.71 IU/mL — ABNORMAL HIGH (ref 0.30–0.70)
Heparin Unfractionated: 0.82 IU/mL — ABNORMAL HIGH (ref 0.30–0.70)

## 2020-10-22 SURGERY — DEBRIDEMENT, WOUND
Anesthesia: Choice | Site: Heel | Laterality: Right

## 2020-10-22 MED ORDER — TIZANIDINE HCL 4 MG PO TABS
4.0000 mg | ORAL_TABLET | Freq: Three times a day (TID) | ORAL | Status: DC | PRN
  Filled 2020-10-22: qty 1

## 2020-10-22 MED ORDER — ONDANSETRON HCL 4 MG/2ML IJ SOLN: 4.0000 mg | Freq: Four times a day (QID) | INTRAMUSCULAR | Status: DC | PRN

## 2020-10-22 MED ORDER — ADULT MULTIVITAMIN W/MINERALS CH
1.0000 | ORAL_TABLET | Freq: Every day | ORAL | Status: DC
  Administered 2020-10-23 – 2020-10-26 (×4): 1
  Filled 2020-10-22 (×4): qty 1

## 2020-10-22 MED ORDER — VITAMIN D 25 MCG (1000 UNIT) PO TABS
2000.0000 [IU] | ORAL_TABLET | Freq: Every day | ORAL | Status: DC
  Administered 2020-10-23 – 2020-10-26 (×4): 2000 [IU]
  Filled 2020-10-22 (×4): qty 2

## 2020-10-22 MED ORDER — ACETAMINOPHEN 325 MG PO TABS
650.0000 mg | ORAL_TABLET | ORAL | Status: DC
  Administered 2020-10-22 – 2020-10-26 (×5): 650 mg
  Filled 2020-10-22 (×9): qty 2

## 2020-10-22 MED ORDER — ACETAMINOPHEN 325 MG PO TABS
650.0000 mg | ORAL_TABLET | Freq: Four times a day (QID) | ORAL | Status: DC | PRN
  Administered 2020-10-25: 650 mg

## 2020-10-22 MED ORDER — JUVEN PO PACK
1.0000 | PACK | Freq: Two times a day (BID) | ORAL | Status: DC
  Administered 2020-10-23 – 2020-10-26 (×6): 1

## 2020-10-22 MED ORDER — INSULIN ASPART 100 UNIT/ML IJ SOLN
0.0000 [IU] | INTRAMUSCULAR | Status: DC
  Administered 2020-10-22: 5 [IU] via SUBCUTANEOUS
  Administered 2020-10-22: 8 [IU] via SUBCUTANEOUS
  Administered 2020-10-23 (×4): 5 [IU] via SUBCUTANEOUS
  Administered 2020-10-23: 3 [IU] via SUBCUTANEOUS
  Administered 2020-10-23: 5 [IU] via SUBCUTANEOUS
  Administered 2020-10-24 (×2): 3 [IU] via SUBCUTANEOUS
  Administered 2020-10-24: 5 [IU] via SUBCUTANEOUS
  Administered 2020-10-24: 8 [IU] via SUBCUTANEOUS
  Administered 2020-10-24: 3 [IU] via SUBCUTANEOUS
  Administered 2020-10-24: 5 [IU] via SUBCUTANEOUS
  Administered 2020-10-24: 3 [IU] via SUBCUTANEOUS
  Administered 2020-10-25: 5 [IU] via SUBCUTANEOUS
  Administered 2020-10-25 (×2): 3 [IU] via SUBCUTANEOUS
  Administered 2020-10-25: 5 [IU] via SUBCUTANEOUS
  Administered 2020-10-26: 2 [IU] via SUBCUTANEOUS
  Administered 2020-10-26: 3 [IU] via SUBCUTANEOUS
  Administered 2020-10-26: 2 [IU] via SUBCUTANEOUS
  Filled 2020-10-22 (×21): qty 1

## 2020-10-22 MED ORDER — DOCUSATE SODIUM 50 MG/5ML PO LIQD
100.0000 mg | Freq: Two times a day (BID) | ORAL | Status: DC
  Administered 2020-10-22 – 2020-10-26 (×8): 100 mg
  Filled 2020-10-22 (×8): qty 10

## 2020-10-22 MED ORDER — ATORVASTATIN CALCIUM 20 MG PO TABS
80.0000 mg | ORAL_TABLET | Freq: Every day | ORAL | Status: DC
  Administered 2020-10-23 – 2020-10-25 (×3): 80 mg
  Filled 2020-10-22 (×3): qty 4

## 2020-10-22 MED ORDER — HEPARIN (PORCINE) 25000 UT/250ML-% IV SOLN
1150.0000 [IU]/h | INTRAVENOUS | Status: DC
  Administered 2020-10-22: 1150 [IU]/h via INTRAVENOUS

## 2020-10-22 MED ORDER — POLYETHYLENE GLYCOL 3350 17 G PO PACK
17.0000 g | PACK | Freq: Every day | ORAL | Status: DC
  Administered 2020-10-23 – 2020-10-25 (×3): 17 g
  Filled 2020-10-22 (×3): qty 1

## 2020-10-22 MED ORDER — ACETAMINOPHEN 650 MG RE SUPP
650.0000 mg | RECTAL | Status: DC
  Administered 2020-10-25: 650 mg via RECTAL

## 2020-10-22 MED ORDER — CLOPIDOGREL BISULFATE 75 MG PO TABS
75.0000 mg | ORAL_TABLET | Freq: Every day | ORAL | Status: DC
  Administered 2020-10-23 – 2020-10-26 (×3): 75 mg
  Filled 2020-10-22 (×4): qty 1

## 2020-10-22 MED ORDER — VANCOMYCIN HCL 1250 MG/250ML IV SOLN
1250.0000 mg | INTRAVENOUS | Status: DC
  Filled 2020-10-22: qty 250

## 2020-10-22 MED ORDER — ASPIRIN 81 MG PO CHEW
81.0000 mg | CHEWABLE_TABLET | Freq: Every day | ORAL | Status: DC
  Administered 2020-10-22 – 2020-10-26 (×5): 81 mg
  Filled 2020-10-22 (×5): qty 1

## 2020-10-22 MED ORDER — PROSOURCE TF PO LIQD
45.0000 mL | Freq: Four times a day (QID) | ORAL | Status: DC
  Administered 2020-10-22 – 2020-10-26 (×13): 45 mL
  Filled 2020-10-22 (×6): qty 45

## 2020-10-22 MED ORDER — FENTANYL CITRATE (PF) 100 MCG/2ML IJ SOLN
25.0000 ug | INTRAMUSCULAR | Status: DC | PRN
  Administered 2020-10-22 – 2020-10-23 (×2): 25 ug via INTRAVENOUS
  Filled 2020-10-22 (×2): qty 2

## 2020-10-22 MED ORDER — ACETAMINOPHEN 160 MG/5ML PO SOLN
650.0000 mg | ORAL | Status: DC
  Administered 2020-10-22 – 2020-10-26 (×13): 650 mg
  Filled 2020-10-22 (×26): qty 20.3

## 2020-10-22 MED ORDER — ORAL CARE MOUTH RINSE
15.0000 mL | OROMUCOSAL | Status: DC
  Administered 2020-10-22 – 2020-10-26 (×38): 15 mL via OROMUCOSAL

## 2020-10-22 MED ORDER — ONDANSETRON HCL 4 MG PO TABS: 4.0000 mg | ORAL_TABLET | Freq: Four times a day (QID) | ORAL | Status: DC | PRN

## 2020-10-22 MED ORDER — ACETAMINOPHEN 650 MG RE SUPP
650.0000 mg | Freq: Four times a day (QID) | RECTAL | Status: DC | PRN
  Filled 2020-10-22: qty 1

## 2020-10-22 MED ORDER — SODIUM CHLORIDE 0.9 % IV BOLUS: 500.0000 mL | Freq: Once | INTRAVENOUS | Status: DC

## 2020-10-22 MED ORDER — CHLORHEXIDINE GLUCONATE 0.12% ORAL RINSE (MEDLINE KIT)
15.0000 mL | Freq: Two times a day (BID) | OROMUCOSAL | Status: DC
  Administered 2020-10-22 – 2020-10-26 (×9): 15 mL via OROMUCOSAL

## 2020-10-22 MED ORDER — HEPARIN (PORCINE) 25000 UT/250ML-% IV SOLN
1500.0000 [IU]/h | INTRAVENOUS | Status: DC
  Administered 2020-10-22 (×2): 1500 [IU]/h via INTRAVENOUS
  Filled 2020-10-22: qty 250

## 2020-10-22 MED ORDER — POVIDONE-IODINE 10 % EX SOLN: CUTANEOUS | Status: DC | PRN

## 2020-10-22 MED ORDER — PREGABALIN 50 MG PO CAPS
100.0000 mg | ORAL_CAPSULE | Freq: Three times a day (TID) | ORAL | Status: DC
  Administered 2020-10-22 – 2020-10-25 (×9): 100 mg
  Filled 2020-10-22 (×9): qty 2

## 2020-10-22 MED ORDER — PIVOT 1.5 CAL PO LIQD
1000.0000 mL | ORAL | Status: DC
  Administered 2020-10-22 – 2020-10-25 (×4): 1000 mL
  Filled 2020-10-22: qty 1000

## 2020-10-22 MED ORDER — FREE WATER
30.0000 mL | Status: DC
  Administered 2020-10-22 – 2020-10-26 (×21): 30 mL

## 2020-10-22 MED ORDER — HEPARIN (PORCINE) 25000 UT/250ML-% IV SOLN: 1150.0000 [IU]/h | INTRAVENOUS | Status: DC

## 2020-10-22 MED ORDER — FENTANYL CITRATE (PF) 100 MCG/2ML IJ SOLN
25.0000 ug | INTRAMUSCULAR | Status: DC | PRN
  Administered 2020-10-22: 50 ug via INTRAVENOUS
  Administered 2020-10-23: 100 ug via INTRAVENOUS
  Filled 2020-10-22 (×3): qty 2

## 2020-10-22 MED ORDER — OXYBUTYNIN CHLORIDE 5 MG PO TABS
5.0000 mg | ORAL_TABLET | Freq: Two times a day (BID) | ORAL | Status: DC
  Administered 2020-10-22 – 2020-10-25 (×6): 5 mg
  Filled 2020-10-22 (×7): qty 1

## 2020-10-22 MED ORDER — POVIDONE-IODINE 5 % EX SOLN
CUTANEOUS | Status: DC | PRN
  Filled 2020-10-22 (×3): qty 473

## 2020-10-22 SURGICAL SUPPLY — 59 items
BLADE OSC/SAGITTAL MD 5.5X18 (BLADE) IMPLANT
BLADE OSCILLATING/SAGITTAL (BLADE)
BLADE SW THK.38XMED LNG THN (BLADE) IMPLANT
BNDG COHESIVE 4X5 TAN STRL (GAUZE/BANDAGES/DRESSINGS) ×3 IMPLANT
BNDG COHESIVE 6X5 TAN STRL LF (GAUZE/BANDAGES/DRESSINGS) ×3 IMPLANT
BNDG CONFORM 2 STRL LF (GAUZE/BANDAGES/DRESSINGS) ×3 IMPLANT
BNDG CONFORM 3 STRL LF (GAUZE/BANDAGES/DRESSINGS) ×3 IMPLANT
BNDG ELASTIC 4X5.8 VLCR STR LF (GAUZE/BANDAGES/DRESSINGS) ×3 IMPLANT
BNDG ESMARK 4X12 TAN STRL LF (GAUZE/BANDAGES/DRESSINGS) ×3 IMPLANT
BNDG GAUZE 4.5X4.1 6PLY STRL (MISCELLANEOUS) ×3 IMPLANT
CANISTER SUCT 1200ML W/VALVE (MISCELLANEOUS) ×3 IMPLANT
COVER WAND RF STERILE (DRAPES) ×3 IMPLANT
CUFF TOURN SGL QUICK 12 (TOURNIQUET CUFF) IMPLANT
CUFF TOURN SGL QUICK 18X4 (TOURNIQUET CUFF) IMPLANT
DRAPE FLUOR MINI C-ARM 54X84 (DRAPES) IMPLANT
DRAPE XRAY CASSETTE 23X24 (DRAPES) IMPLANT
DRSG MEPILEX FLEX 3X3 (GAUZE/BANDAGES/DRESSINGS) IMPLANT
DURAPREP 26ML APPLICATOR (WOUND CARE) ×3 IMPLANT
ELECT REM PT RETURN 9FT ADLT (ELECTROSURGICAL) ×3
ELECTRODE REM PT RTRN 9FT ADLT (ELECTROSURGICAL) ×1 IMPLANT
GAUZE 4X4 16PLY ~~LOC~~+RFID DBL (SPONGE) ×3 IMPLANT
GAUZE PACKING 1/4 X5 YD (GAUZE/BANDAGES/DRESSINGS) ×3 IMPLANT
GAUZE PACKING IODOFORM 1X5 (PACKING) ×3 IMPLANT
GAUZE SPONGE 4X4 12PLY STRL (GAUZE/BANDAGES/DRESSINGS) ×3 IMPLANT
GAUZE XEROFORM 1X8 LF (GAUZE/BANDAGES/DRESSINGS) ×3 IMPLANT
GLOVE SURG ENC MOIS LTX SZ7.5 (GLOVE) ×3 IMPLANT
GLOVE SURG UNDER LTX SZ8 (GLOVE) ×3 IMPLANT
GOWN STRL REUS W/ TWL XL LVL3 (GOWN DISPOSABLE) ×2 IMPLANT
GOWN STRL REUS W/TWL MED LVL3 (GOWN DISPOSABLE) ×6 IMPLANT
GOWN STRL REUS W/TWL XL LVL3 (GOWN DISPOSABLE) ×4
HANDPIECE VERSAJET DEBRIDEMENT (MISCELLANEOUS) IMPLANT
IV NS 1000ML (IV SOLUTION) ×2
IV NS 1000ML BAXH (IV SOLUTION) ×1 IMPLANT
IV NS IRRIG 3000ML ARTHROMATIC (IV SOLUTION) ×3 IMPLANT
KIT TURNOVER KIT A (KITS) ×3 IMPLANT
LABEL OR SOLS (LABEL) ×3 IMPLANT
MANIFOLD NEPTUNE II (INSTRUMENTS) ×3 IMPLANT
NEEDLE FILTER BLUNT 18X 1/2SAF (NEEDLE) ×2
NEEDLE FILTER BLUNT 18X1 1/2 (NEEDLE) ×1 IMPLANT
NEEDLE HYPO 25X1 1.5 SAFETY (NEEDLE) ×3 IMPLANT
NS IRRIG 500ML POUR BTL (IV SOLUTION) ×3 IMPLANT
PACK EXTREMITY ARMC (MISCELLANEOUS) ×3 IMPLANT
PAD ABD DERMACEA PRESS 5X9 (GAUZE/BANDAGES/DRESSINGS) ×3 IMPLANT
PULSAVAC PLUS IRRIG FAN TIP (DISPOSABLE) ×3
RASP SM TEAR CROSS CUT (RASP) IMPLANT
SHIELD FULL FACE ANTIFOG 7M (MISCELLANEOUS) ×3 IMPLANT
SOL PREP PVP 2OZ (MISCELLANEOUS) ×3
SOLUTION PREP PVP 2OZ (MISCELLANEOUS) ×1 IMPLANT
STOCKINETTE IMPERVIOUS 9X36 MD (GAUZE/BANDAGES/DRESSINGS) ×3 IMPLANT
SUT ETHILON 2 0 FS 18 (SUTURE) ×6 IMPLANT
SUT ETHILON 4-0 (SUTURE) ×2
SUT ETHILON 4-0 FS2 18XMFL BLK (SUTURE) ×1
SUT VIC AB 3-0 SH 27 (SUTURE) ×2
SUT VIC AB 3-0 SH 27X BRD (SUTURE) ×1 IMPLANT
SUT VIC AB 4-0 FS2 27 (SUTURE) ×3 IMPLANT
SUTURE ETHLN 4-0 FS2 18XMF BLK (SUTURE) ×1 IMPLANT
SWAB CULTURE AMIES ANAERIB BLU (MISCELLANEOUS) IMPLANT
SYR 10ML LL (SYRINGE) ×6 IMPLANT
TIP FAN IRRIG PULSAVAC PLUS (DISPOSABLE) ×1 IMPLANT

## 2020-10-22 NOTE — Consult Note (Signed)
Pharmacy Antibiotic Note  Melinda [name] is a 65 y.o. female admitted on 10/01/2020 with non-healing right heel wound.  Pharmacy has been consulted for vancomycin dosing for wound infection.    Plan: Scr: 1.13 > 1.64 will adjust dosing interval and CTM renal fxn daily.  Vanc 2g LD x1 in ED;  Vancomycin 1250mg  Q24H --> Vanco 1250mg  q36h  Goal AUC 400-550 Predicted AUC: 466.5 Cmax 32.9; cmin 10.8  Cefepime / Flagyl --> Unasyn per ID consult recommendation.  Height: 5\' 8"  (172.7 cm) Weight: 110.7 kg (244 lb) IBW/kg (Calculated) : 63.9  Temp (24hrs), Avg:97.1 F (36.2 C), Min:96.26 F (35.7 C), Max:98.7 F (37.1 C)  Recent Labs  Lab 10/23/2020 1419 09/30/2020 1649 10/20/20 0924 10/20/20 0930 10/09/2020 0519 10/02/2020 2030 10/28/2020 0000 10/09/2020 0050 10/27/2020 0620 10/13/2020 0645  WBC 14.7*  --   --  15.0* 13.6* 26.1* 24.9*  --  29.0*  --   CREATININE 1.13*  --   --  1.10* 1.16* 1.51*  --   --  1.64*  --   LATICACIDVEN 2.5* 2.0* 1.7  --   --  >11.0*  --  8.3*  --  5.1*     Estimated Creatinine Clearance: 43.4 mL/min (A) (by C-G formula based on SCr of 1.64 mg/dL (H)).    No Known Allergies  Antimicrobials this admission: 6/21 cefepime + Flagyl --> Unasyn 6/23>> 6/21 vancomycin >>    Dose adjustments this admission: CTM creatinine rising.  Microbiology results: 6/21 BCx: Ng2d  Thank you for allowing pharmacy to be a part of this patient's care.  Lorna Dibble, PharmD Clinical Pharmacist   10/13/2020 8:03 AM

## 2020-10-22 NOTE — Progress Notes (Signed)
Date of Admission:  10/07/2020     ID: Melinda [name] is a 65 y.o. female  Principal Problem:   Acute respiratory failure with hypoxia ([place]) Active Problems:   Essential hypertension   S/P CABG x 4   Obesity   Hyperglycemia   Amputated toe, left (HCC)   Amputated toe, right (HCC)   S/P gastric bypass   CKD (chronic kidney disease), stage IIIa   Depression   AF (paroxysmal atrial fibrillation) (HCC)   Wound infection   Sepsis ([place])   Acute osteomyelitis of calcaneum, right (HCC)   HFrEF (heart failure with reduced ejection fraction) ([place])  Pt moved to ICU last evening because of code blue- respiratory arrest had a VT cardiac arrest, resucitated and in ICU  Subjective: None available  Medications:   acetaminophen  650 mg Per Tube Q4H   Or   acetaminophen (TYLENOL) oral liquid 160 mg/5 mL  650 mg Per Tube Q4H   Or   acetaminophen  650 mg Rectal Q4H   aspirin  81 mg Per Tube Daily   atorvastatin  80 mg Oral QHS   chlorhexidine  60 mL Topical Once   chlorhexidine gluconate (MEDLINE KIT)  15 mL Mouth Rinse BID   Chlorhexidine Gluconate Cloth  6 each Topical Daily   [START ON 10/23/2020] cholecalciferol  2,000 Units Per Tube Daily   [START ON 10/23/2020] clopidogrel  75 mg Per Tube Daily   docusate  100 mg Per Tube BID   DULoxetine  60 mg Oral Daily   feeding supplement (PIVOT 1.5 CAL)  1,000 mL Per Tube Q24H   feeding supplement (PROSource TF)  45 mL Per Tube QID   free water  30 mL Per Tube Q4H   insulin aspart  0-15 Units Subcutaneous Q4H   insulin glargine  6 Units Subcutaneous QHS   mouth rinse  15 mL Mouth Rinse 10 times per day   [START ON 10/23/2020] multivitamin with minerals  1 tablet Per Tube Daily   [START ON 10/23/2020] nutrition supplement (JUVEN)  1 packet Per Tube BID BM   oxybutynin  5 mg Per Tube BID   pantoprazole (PROTONIX) IV  40 mg Intravenous QHS   polyethylene glycol  17 g Per Tube Daily   povidone-iodine  2 application Topical Once    pregabalin  100 mg Per Tube TID    Objective: Vital signs in last 24 hours: Temp:  [96.26 F (35.7 C)-98.2 F (36.8 C)] 97.7 F (36.5 C) (06/24 1530) Pulse Rate:  [70-81] 71 (06/24 1800) Resp:  [14-27] 20 (06/24 1800) BP: (70-149)/(47-76) 135/64 (06/24 1800) SpO2:  [90 %-100 %] 91 % (06/24 1914) Arterial Line BP: (57-140)/(44-59) 123/54 (06/24 1800) FiO2 (%):  [30 %-100 %] 30 % (06/24 1914)  PHYSICAL EXAM:  General: intubated, mildly sedated- responds to questions by nodding Lungs: b/l air entry Heart: irregular Abdomen: Soft, non-tender,not distended. Bowel sounds normal. No masses Extremities: rt heel necrotic wound with some foul odor Has an ischemic superficial area on the dorsal aspect of ankle       Left great toe partial amputation Small wound on the plantar surface Skin: No rashes or lesions. Or bruising Lymph: Cervical, supraclavicular normal. Neurologic: cannot assess  Lab Results Recent Labs    10/10/2020 2030 10/20/2020 0000 10/07/2020 0620  WBC 26.1* 24.9* 29.0*  HGB 9.0* 9.1* 8.8*  HCT 27.5* 26.6* 25.0*  NA 137  --  140  K 4.7  --  3.6  CL 103  --  105  CO2 17*  --  23  BUN 18  --  22  CREATININE 1.51*  --  1.64*   Liver Panel No results for input(s): PROT, ALBUMIN, AST, ALT, ALKPHOS, BILITOT, BILIDIR, IBILI in the last 72 hours. Sedimentation Rate No results for input(s): ESRSEDRATE in the last 72 hours. C-Reactive Protein No results for input(s): CRP in the last 72 hours.  Microbiology: 10/13/2020- BC- NG Studies/Results: DG Abd 1 View  Result Date: 10/20/2020 CLINICAL DATA:  Assess orogastric tube EXAM: ABDOMEN - 1 VIEW COMPARISON:  Earlier today FINDINGS: Enteric tube with tip at the stomach. The side port is likely just below the GE junction. Extensive artifact from support hardware. Minimal coverage of the abdomen showing no gas dilated bowel. Lower chest opacity known from dedicated radiography. IMPRESSION: The enteric tube tip is at the  upper stomach. Electronically Signed   By: Melinda [name] M.D.   On: 10/04/2020 11:52   DG Abd 1 View  Result Date: 10/09/2020 CLINICAL DATA:  OG tube placement EXAM: ABDOMEN - 1 VIEW COMPARISON:  Chest x-ray 10/17/2020 FINDINGS: Limited radiograph of the lower chest and upper abdomen was obtained for the purposes of enteric tube localization. There are numerous overlying monitoring leads. A distinct orogastric tube is not definitively identified. Visualized bowel gas pattern is nonobstructive. IMPRESSION: Numerous overlying monitoring leads a distinct orogastric tube is not definitively identified. A repeat film with inclusion of more of the chest would be helpful to further assess. Electronically Signed   By: Melinda [name] D.O.   On: 10/13/2020 10:58   PERIPHERAL VASCULAR CATHETERIZATION  Result Date: 10/09/2020 See surgical note for result.  DG Chest Port 1 View  Result Date: 10/20/2020 CLINICAL DATA:  Central line, endotracheal and orogastric tube placement. EXAM: PORTABLE CHEST 1 VIEW COMPARISON:  Radiograph yesterday. FINDINGS: Tip of the endotracheal tube is approximately 2.6 cm from the carina, carina is difficult to accurately define. No enteric tube is seen. Right internal jugular central venous catheter tip projects over the SVC. No pneumothorax. Prior median sternotomy. Stable cardiomegaly. Diffuse interstitial opacities with slight worsening at the lung bases from earlier today. There may be small pleural effusions. IMPRESSION: 1. Tip of the endotracheal tube approximately 2.6 cm from the carina. Right internal jugular central venous catheter tip projects over the SVC. No pneumothorax. 2. Diffuse interstitial opacities with slight worsening at the lung bases from earlier today, suspicious for pulmonary edema. Probable pleural effusions. Stable cardiomegaly post CABG. Electronically Signed   By: Keith Rake M.D.   On: 10/20/2020 21:23   EEG adult  Result Date: 10/04/2020 Melinda Jack, MD     10/20/2020  3:34 PM Routine EEG Report Melinda [name] is a 65 y.o. female with a history of cardiac arrest who is undergoing an EEG to evaluate for seizures. Report: This EEG was acquired with electrodes placed according to the International 10-20 electrode system (including Fp1, Fp2, F3, F4, C3, C4, P3, P4, O1, O2, T3, T4, T5, T6, A1, A2, Fz, Cz, Pz). The following electrodes were missing or displaced: none. The background was continuous and composed primarily of theta frequencies 5-7 Hz. This activity is reactive to stimulation. Some sleep architecture (K complexes) was noted. There was no focal slowing. There were no interictal epileptiform discharges. There were no electrographic seizures identified. There was no abnormal response to photic stimulation. Hyperventilation was not performed 2/2 patient being on a ventilator. Impression and clinical correlation: This EEG was obtained while awake and asleep  and was abnormal due to moderate diffuse slowing. No epileptiform abnormalities were noted during this study. Su Monks, MD [facility] [phone] If 7pm- 7am, please page neurology on call as listed in [place].   ECHOCARDIOGRAM COMPLETE  Result Date: 10/02/2020    ECHOCARDIOGRAM REPORT   Patient Name:   [facility] Date of Exam: 10/18/2020 Medical Rec #:  [id number]          Height:       68.0 in Accession #:    [id number]         Weight:       244.0 lb Date of Birth:  [birth year]          BSA:          2.224 m Patient Age:    46 years           BP:           134/61 mmHg Patient Gender: F                  HR:           80 bpm. Exam Location:  [facility] Procedure: 2D Echo, Cardiac Doppler, Color Doppler and Strain Analysis Indications:     Cardiac Arrest I46.9  History:         Patient has prior history of Echocardiogram examinations, most                  recent 08/12/2020. Previous Myocardial Infarction,                  Arrythmias:Atrial Fibrillation and Atrial Flutter;  Risk                  Factors:Hypertension and Diabetes.  Sonographer:     Sherrie Sport RDCS (AE) Referring Phys:  [id number] [facility] Pickle Diagnosing Phys: Nelva Bush MD  Sonographer Comments: Global longitudinal strain was attempted. IMPRESSIONS  1. Left ventricular ejection fraction, by estimation, is 45%. The left ventricle has mild to moderately decreased function. The left ventricle demonstrates global hypokinesis. There is moderate left ventricular hypertrophy. Left ventricular diastolic parameters are consistent with Grade II diastolic dysfunction (pseudonormalization). Elevated left atrial pressure. The average left ventricular global longitudinal strain is -12.2 %. The global longitudinal strain is abnormal.  2. Pulmonary artery pressure is at least mildly to moderately elevated (RVSP 35-40 mmHg plus central venous pressure). Right ventricular systolic function is moderately reduced. The right ventricular size is mildly enlarged.  3. Left atrial size was moderately dilated.  4. Right atrial size was mildly dilated.  5. The mitral valve is normal in structure. Mild mitral valve regurgitation. No evidence of mitral stenosis.  6. Tricuspid valve regurgitation is mild to moderate.  7. The aortic valve has an indeterminant number of cusps. There is mild calcification of the aortic valve. There is mild thickening of the aortic valve. Aortic valve regurgitation is not visualized. Mild aortic valve sclerosis is present, with no evidence of aortic valve stenosis. FINDINGS  Left Ventricle: Left ventricular ejection fraction, by estimation, is 45%. The left ventricle has mild to moderately decreased function. The left ventricle demonstrates global hypokinesis. The average left ventricular global longitudinal strain is -12.2  %. The global longitudinal strain is abnormal. The left ventricular internal cavity size was normal in size. There is moderate left ventricular hypertrophy. Left ventricular diastolic  parameters are consistent with Grade II diastolic dysfunction (pseudonormalization). Elevated left atrial pressure. Right Ventricle: Pulmonary artery pressure is  at least mildly to moderately elevated (RVSP 35-40 mmHg plus central venous pressure). The right ventricular size is mildly enlarged. No increase in right ventricular wall thickness. Right ventricular systolic function is moderately reduced. Left Atrium: Left atrial size was moderately dilated. Right Atrium: Right atrial size was mildly dilated. Pericardium: There is no evidence of pericardial effusion. Mitral Valve: The mitral valve is normal in structure. Mild mitral valve regurgitation. No evidence of mitral valve stenosis. Tricuspid Valve: The tricuspid valve is normal in structure. Tricuspid valve regurgitation is mild to moderate. Aortic Valve: The aortic valve has an indeterminant number of cusps. There is mild calcification of the aortic valve. There is mild thickening of the aortic valve. Aortic valve regurgitation is not visualized. Mild aortic valve sclerosis is present, with  no evidence of aortic valve stenosis. Aortic valve mean gradient measures 3.0 mmHg. Aortic valve peak gradient measures 5.9 mmHg. Aortic valve area, by VTI measures 2.69 cm. Pulmonic Valve: The pulmonic valve was grossly normal. Pulmonic valve regurgitation is not visualized. No evidence of pulmonic stenosis. Aorta: The aortic root is normal in size and structure. Pulmonary Artery: The pulmonary artery is not well seen. Venous: IVC assessment for right atrial pressure unable to be performed due to mechanical ventilation. IAS/Shunts: The interatrial septum was not well visualized.  LEFT VENTRICLE PLAX 2D LVIDd:         5.11 cm  Diastology LVIDs:         3.65 cm  LV e' medial:    5.55 cm/s LV PW:         1.41 cm  LV E/e' medial:  21.4 LV IVS:        0.98 cm  LV e' lateral:   7.72 cm/s LVOT diam:     2.10 cm  LV E/e' lateral: 15.4 LV SV:         65 LV SV Index:   29       2D  Longitudinal Strain LVOT Area:     3.46 cm 2D Strain GLS Avg:     -12.2 %                          3D Volume EF:                         3D EF:        44 %                         LV EDV:       113 ml                         LV ESV:       63 ml                         LV SV:        50 ml RIGHT VENTRICLE RV Basal diam:  4.54 cm RV S prime:     7.29 cm/s TAPSE (M-mode): 1.0 cm LEFT ATRIUM              Index       RIGHT ATRIUM           Index LA diam:        4.90 cm  2.20 cm/m  RA Area:     19.10  cm LA Vol (A2C):   122.0 ml 54.86 ml/m RA Volume:   52.40 ml  23.56 ml/m LA Vol (A4C):   68.2 ml  30.67 ml/m LA Biplane Vol: 92.6 ml  41.64 ml/m  AORTIC VALVE                   PULMONIC VALVE AV Area (Vmax):    2.45 cm    PV Vmax:        0.63 m/s AV Area (Vmean):   2.36 cm    PV Peak grad:   1.6 mmHg AV Area (VTI):     2.69 cm    RVOT Peak grad: 3 mmHg AV Vmax:           121.00 cm/s AV Vmean:          80.900 cm/s AV VTI:            0.243 m AV Peak Grad:      5.9 mmHg AV Mean Grad:      3.0 mmHg LVOT Vmax:         85.50 cm/s LVOT Vmean:        55.200 cm/s LVOT VTI:          0.189 m LVOT/AV VTI ratio: 0.78  AORTA Ao Root diam: 3.07 cm MITRAL VALVE                TRICUSPID VALVE MV Area (PHT): 3.60 cm     TR Peak grad:   36.7 mmHg MV Decel Time: 211 msec     TR Vmax:        303.00 cm/s MV E velocity: 119.00 cm/s MV A velocity: 71.60 cm/s   SHUNTS MV E/A ratio:  1.66         Systemic VTI:  0.19 m                             Systemic Diam: 2.10 cm Nelva Bush MD Electronically signed by Nelva Bush MD Signature Date/Time: 10/28/2020/12:55:02 PM    Final      Assessment/Plan: Dimas Millin arrest/VT last evening- code blue- resuscitated, intubated. Needed pressure support for a short time- seen by cardiology- recommendign cardiac cath sometime  Afib/ new RBBB  On amiodarone and heparin dripSeen  May need to R/o PE  CAD - h/o CABG  HFrEF- - echo ordered  Rt heel necrotic wound- debridement was being  planned today but postponed due to above events culture sent-  On vanco and unasyn  PAD- underwent angio yesterday mechanical thrombectomy of rt popliteal, angioplasty of popliteal, rt peroneal and tibioperoneal trunk, rt ATA.  Discussed the management with care team ID will follow her peripherally this weekend- call if needed

## 2020-10-22 NOTE — Consult Note (Signed)
ANTICOAGULATION CONSULT NOTE - Follow Up Consult  Pharmacy Consult for Heparin Drip Indication: ACS/NSTEMI  No Known Allergies  Patient Measurements: Height: 5\' 8"  (172.7 cm) Weight: 110.7 kg (244 lb) IBW/kg (Calculated) : 63.9 Heparin dosing wt 89.1 kg  Vital Signs: Temp: 96.44 F (35.8 C) (06/24 0600) BP: 109/59 (06/24 0000) Pulse Rate: 76 (06/24 0600)  Labs: Recent Labs    10/17/2020 1649 10/20/20 0924 10/20/20 0930 10/20/20 1559 10/20/20 2255 10/27/2020 0519 10/27/2020 1259 10/04/2020 2030 09/29/2020 0000 10/14/2020 0620 10/28/2020 0645  HGB  --   --  9.7*  --   --  9.5*  --  9.0* 9.1* 8.8*  --   HCT  --   --  29.0*  --   --  27.6*  --  27.5* 26.6* 25.0*  --   PLT  --   --  342  --   --  340  --  337 345 345  --   APTT 48*  --   --    < > 51*  --  49*  --   --   --  158*  LABPROT 23.0*  --  21.6*  --   --   --   --  22.0*  --   --   --   INR 2.0*  --  1.9*  --   --   --   --  1.9*  --   --   --   HEPARINUNFRC  --  >1.10*  --   --   --   --   --   --   --   --  0.71*  CREATININE  --   --  1.10*  --   --  1.16*  --  1.51*  --  1.64*  --   TROPONINIHS  --   --   --   --   --   --   --  17 27*  --   --    < > = values in this interval not displayed.     Estimated Creatinine Clearance: 43.4 mL/min (A) (by C-G formula based on SCr of 1.64 mg/dL (H)).   Medications: NKDA PTA apixiban dose 5mg  bid - last dose 6/21 @ 1000 Also on plavix 75mg  QD & ASA 81mg  QD Heparin dosing wt 89.1 kg  Assessment: 65 yo female with history of a fib (on eliquis) and PAD - pharmacy has been consulted to initiate and monitor a heparin drip pending surgical plan. 6/24:  Heparin gtt stopped on 6/23 @ 0755 in preparation for vascular surgery. APTT @ 1259 = 49, subtherapeutic but reflects ~ 5 hrs since heparin was d/c'd. Pt had elevated troponins on 6/24 @ 0000 so heparin was restarted for ACS/NSTEMI.   Date Time aPTT/HL Rate/Comment 6/21 1000 ---  Last dose of eliquis PTA; 1100 un/hr 6/22 0924 45s /  >1.10 1100 un/hr > 1400 un/hr 6/23 0755 49s / --- Drip was off for 5hrs for Vasc Sx 6/24 0000 ---  Resumed for ACS/NSTEMI - trop elevation 6/24  0645 158s / 0.71 1700 > 1500 un/hr (hold 1hr; then lower rate)  Labs: aPTT - 48s INR - 2.0>1.9 Hgb - 10>9>8.8 Plts - 563>149>702   Goal of Therapy:  Heparin level 0.3-0.7 units/ml; aPTT 66-102s Monitor platelets by anticoagulation protocol: Yes   Plan:  aPTT elevated but anti-xa starting to come down. Both greater than goal, will titrate per aPTT and anticipate correlation of labs at next interval. Hold Hep gtt  1hr [phone]); then resume at reduced rate of 1500 un/hr. Will recheck aPTT and HL 6 hrs after rate change.   Lorna Dibble, PharmD Clinical Pharmacist 09/30/2020 7:23 AM

## 2020-10-22 NOTE — Progress Notes (Signed)
[facility] Daily Progress Note  10/10/2020:             1.  Ultrasound guidance for vascular access left femoral artery             2.  Catheter placement into right common femoral artery from left femoral approach             3.  Aortogram and selective right lower extremity angiogram             4.  Percutaneous transluminal angioplasty of right peroneal artery and tibioperoneal trunk with 2.5 mm diameter by 15 cm length angioplasty balloon             5.  Mechanical thrombectomy of the right popliteal artery with the [place] device             6.  Percutaneous transluminal angioplasty of the right popliteal artery with 4 mm diameter by 10 cm length Lutonix drug-coated angioplasty balloon             7.  Percutaneous transluminal angioplasty of the right anterior tibial artery with 2 inflations with a 3 mm diameter by 10 cm length angioplasty balloon             8.  StarClose closure device left femoral artery  Subjective: Patient is intubated and sedated.  Family at bedside.  Had rapid response yesterday evening for respiratory arrest however pulse was never lost.  She is now intubated sedated on pressors in the ICU.  Objective: Vitals:   10/16/2020 0743 10/18/2020 0800 10/02/2020 0900 10/08/2020 1000  BP: 134/61 134/63 (!) 146/64 136/76  Pulse: 80 80 78 73  Resp: (!) 26 (!) 24 (!) 25 16  Temp: 98.1 F (36.7 C)   98 F (36.7 C)  TempSrc: Axillary   Axillary  SpO2: 100% 100% 100% 100%  Weight:      Height:        Intake/Output Summary (Last 24 hours) at 10/11/2020 1236 Last data filed at 10/10/2020 0600 Gross per 24 hour  Intake --  Output 475 ml  Net -475 ml   Physical Exam: A&Ox3, NAD CV: RRR Pulmonary: CTA Bilaterally Abdomen: Soft, Nontender, Nondistended Left groin access site:  Clean dry and intact.  No swelling or drainage. Vascular:  Right lower extremity: Thigh soft.  Calf soft.  Extremity is relatively warm distally however does become cooler  towards the toes.  Dressing intact clean and dry.   Laboratory: CBC    Component Value Date/Time   WBC 29.0 (H) 10/16/2020 0620   HGB 8.8 (L) 10/20/2020 0620   HGB 13.1 10/10/2016 1911   HCT 25.0 (L) 10/06/2020 0620   HCT 40.6 10/10/2016 1911   PLT 345 10/17/2020 0620   PLT 301 10/10/2016 1911   BMET    Component Value Date/Time   NA 140 10/05/2020 0620   NA 139 10/10/2016 1911   NA 140 06/03/2014 0405   K 3.6 10/16/2020 0620   K 4.1 06/03/2014 0405   CL 105 10/13/2020 0620   CL 107 06/03/2014 0405   CO2 23 10/18/2020 0620   CO2 26 06/03/2014 0405   GLUCOSE 210 (H) 10/06/2020 0620   GLUCOSE 227 (H) 06/03/2014 0405   BUN 22 10/05/2020 0620   BUN 16 10/10/2016 1911   BUN 13 06/03/2014 0405   CREATININE 1.64 (H) 10/07/2020 0620   CREATININE 1.13 06/03/2014 0405   CALCIUM 7.6 (L) 10/24/2020 0620   CALCIUM  8.1 (L) 06/03/2014 0405   GFRNONAA 34 (L) 10/03/2020 0620   GFRNONAA 52 (L) 06/03/2014 0405   GFRNONAA 54 (L) 11/28/2013 1754   GFRAA 51 (L) 02/02/2020 1110   GFRAA >60 06/03/2014 0405   GFRAA >60 11/28/2013 1754   Assessment/Planning: The patient is a 65 year old female with known history of atherosclerotic disease with gangrenous changes to the right lower extremity status post right lower extremity angiogram with intervention - POD#1  1) rapid response called yesterday evening for respiratory arrest.  Patient is now intubated, sedated and on pressors in the ICU.  2) now that the patient is on pressors we will have to monitor the right lower extremity / recent intervention for continued arterial patency.  Patient remains on heparin drip. 3) will continue to follow  Discussed with Dr. Ellis [facility] [name] 10/02/2020 12:36 PM

## 2020-10-22 NOTE — Progress Notes (Signed)
*  PRELIMINARY RESULTS* Echocardiogram 2D Echocardiogram has been performed.  Melinda [name] 10/24/2020, 8:41 AM

## 2020-10-22 NOTE — Consult Note (Signed)
ANTICOAGULATION CONSULT NOTE - Follow Up Consult  Pharmacy Consult for Heparin Drip Indication: ACS/NSTEMI  No Known Allergies  Patient Measurements: Height: 5\' 8"  (172.7 cm) Weight: 110.7 kg (244 lb) IBW/kg (Calculated) : 63.9 Heparin dosing wt 89.1 kg  Vital Signs: Temp: 96.98 F (36.1 C) (06/23 2300) Temp Source: Oral (06/23 1532) BP: 127/69 (06/23 2300) Pulse Rate: 79 (06/23 2300)  Labs: Recent Labs    10/07/2020 1649 10/20/20 0924 10/20/20 0930 10/20/20 1559 10/20/20 2255 10/28/2020 0519 10/09/2020 1259 10/08/2020 2030 09/29/2020 0000  HGB  --   --  9.7*  --   --  9.5*  --  9.0* 9.1*  HCT  --   --  29.0*  --   --  27.6*  --  27.5* 26.6*  PLT  --   --  342  --   --  340  --  337 345  APTT 48*  --   --  45* 51*  --  49*  --   --   LABPROT 23.0*  --  21.6*  --   --   --   --  22.0*  --   INR 2.0*  --  1.9*  --   --   --   --  1.9*  --   HEPARINUNFRC  --  >1.10*  --   --   --   --   --   --   --   CREATININE  --   --  1.10*  --   --  1.16*  --  1.51*  --   TROPONINIHS  --   --   --   --   --   --   --  17 27*     Estimated Creatinine Clearance: 47.1 mL/min (A) (by C-G formula based on SCr of 1.51 mg/dL (H)).   Medications:  PTA apixiban dose 5mg  bid - last dose 6/21 @ 1000  Assessment: 65 yo female with history of a fib and PAD - pharmacy has been consulted to initiate and monitor a heparin drip pending surgical plan.  Baseline HGB and APTT as above.  6/24:  Heparin gtt stopped on 6/23 @ 0755 in preparation for vascular surgery.   APTT @ 1259 = 49, subtherapeutic but reflects ~ 5 hrs since heparin was d/c'd.   Pt had elevated troponins on 6/24 @ 0000 so heparin was restarted for ACS/NSTEMI.     Goal of Therapy:  Heparin level 0.3-0.7 units/ml Monitor platelets by anticoagulation protocol: Yes   Plan:  Since heparin gtt has been of since 6/23 @ 0755, will reorder heparin bolus and restart at previous rate.   Will order heparin 4000 units IV X 1 bolus and  restart drip at 1700 units/hr.  Will recheck aPTT and HL 6 hrs after start of drip on 6/24 @ 0700.   Orene Desanctis, PharmD Clinical Pharmacist 09/29/2020 12:54 AM

## 2020-10-22 NOTE — Consult Note (Signed)
PHARMACY CONSULT NOTE - FOLLOW UP  Pharmacy Consult for Electrolyte Monitoring and Replacement   Recent Labs: Potassium (mmol/L)  Date Value  10/11/2020 3.6  06/03/2014 4.1   Magnesium (mg/dL)  Date Value  10/23/2020 1.7   Calcium (mg/dL)  Date Value  10/18/2020 7.6 (L)   Calcium, Total (mg/dL)  Date Value  06/03/2014 8.1 (L)   Albumin (g/dL)  Date Value  10/08/2020 2.1 (L)  10/10/2016 3.9  06/01/2014 3.2 (L)   Phosphorus (mg/dL)  Date Value  08/12/2020 3.4   Sodium (mmol/L)  Date Value  10/24/2020 140  10/10/2016 139  06/03/2014 140     Assessment: 65yo female w/ h/o  CKD 3B, IDDM, anxiety/MDD, HTN, Afib, OA, PAD (s/p Lt first toe amp & right toe amputation), HFrEF (EF30-35%), CAD (s/p CABG x4), sickle cell trait, tobacco dependency, s/p gastric bypass, presenting with chief concerns of right heel wound & osteomyelitis.  K: 2.5>3.7>4.7>3.6 (on lasix 40 IV BID>stopped) Mg: 1.4>2.0>2.4>1.7  Goal of Therapy:  Lytes WNL  Plan:  Diuretic stopped with rising creatinine. Lytes downtrending but WNL today. Will reassess with AM labs.  Lorna Dibble ,PharmD Clinical Pharmacist 10/07/2020 7:41 AM

## 2020-10-22 NOTE — Progress Notes (Signed)
Pt scheduled for debridment of heel in OR today.  Currently intubated secondary to cardiopulmonary arrest and in ICU now.  Cancel surgery and will follow peripherally.  Can consider surgery once pt stable.  Pt with infected heel ulcer and will need daily dressings. Orders written.

## 2020-10-22 NOTE — Progress Notes (Signed)
Eeg done 

## 2020-10-22 NOTE — Consult Note (Signed)
ANTICOAGULATION CONSULT NOTE - Follow Up Consult  Pharmacy Consult for Heparin Drip Indication: ACS/NSTEMI  No Known Allergies  Patient Measurements: Height: 5\' 8"  (172.7 cm) Weight: 110.7 kg (244 lb) IBW/kg (Calculated) : 63.9 Heparin dosing wt 89.1 kg  Vital Signs: Temp: 97.7 F (36.5 C) (06/24 1530) Temp Source: Axillary (06/24 1530) BP: 135/64 (06/24 1800) Pulse Rate: 71 (06/24 1800)  Labs: Recent Labs     0000 10/20/20 0924 10/20/20 0930 10/20/20 1559 10/18/2020 0519 10/15/2020 1259 10/20/2020 2030 10/15/2020 0000 10/14/2020 0620 10/02/2020 0645 10/15/2020 1018 10/23/2020 1303 10/06/2020 1505 10/01/2020 1628 10/09/2020 1720  HGB   < >  --  9.7*  --  9.5*  --  9.0* 9.1* 8.8*  --   --   --   --   --   --   HCT   < >  --  29.0*  --  27.6*  --  27.5* 26.6* 25.0*  --   --   --   --   --   --   PLT   < >  --  342  --  340  --  337 345 345  --   --   --   --   --   --   APTT  --   --   --    < >  --    < >  --   --   --  158*  --   --  >200*  --  >200*  LABPROT  --   --  21.6*  --   --   --  22.0*  --   --   --   --   --   --   --   --   INR  --   --  1.9*  --   --   --  1.9*  --   --   --   --   --   --   --   --   HEPARINUNFRC  --  >1.10*  --   --   --   --   --   --   --  0.71*  --   --  0.82*  --   --   CREATININE   < >  --  1.10*  --  1.16*  --  1.51*  --  1.64*  --   --   --   --   --   --   TROPONINIHS  --   --   --   --   --    < > 17 27*  --   --  48* 53*  --  46*  --    < > = values in this interval not displayed.     Estimated Creatinine Clearance: 43.4 mL/min (A) (by C-G formula based on SCr of 1.64 mg/dL (H)).   Medications: NKDA PTA apixiban dose 5mg  bid - last dose 6/21 @ 1000 Also on plavix 75mg  QD & ASA 81mg  QD Heparin dosing wt 89.1 kg  Assessment: 65 yo female with history of a fib (on eliquis) and PAD - pharmacy has been consulted to initiate and monitor a heparin drip pending surgical plan. 6/24:  Heparin gtt stopped on 6/23 @ 0755 in preparation for vascular  surgery. APTT @ 1259 = 49, subtherapeutic but reflects ~ 5 hrs since heparin was d/c'd. Pt had elevated troponins on 6/24 @ 0000 so heparin was restarted for ACS/NSTEMI.   Date  Time aPTT/HL Rate/Comment 6/21 1000 ---  Last dose of eliquis PTA; 1100 un/hr 6/22 0924 45s / >1.10 1100 un/hr > 1400 un/hr 6/23 0755 49s / --- Drip was off for 5hrs for Vasc Sx 6/24 0000 ---  Resumed for ACS/NSTEMI - trop elevation 6/24  0645 158s / 0.71 1700 > 1500 un/hr (hold 1hr; then lower rate) 6/24 1505 >200s/ 0.82 Both levels higher than expected given rate reduction --> recollect   6/24 1720 > 200s  Hold, 1 hour , then lower rate  1500 > 1150 units/hr  Labs: aPTT - 48s INR - 2.0>1.9 Hgb - 10>9>8.8 Plts - [phone]   Goal of Therapy:  Heparin level 0.3-0.7 units/ml; aPTT 66-102s Monitor platelets by anticoagulation protocol: Yes   Plan:  aPTT elevated but anti-xa starting to come down. Both greater than goal, will titrate per aPTT and anticipate correlation of labs at next interval. Hold Hep gtt 1hr (1930-2030); then resume at reduced rate of 1150 un/hr. Will recheck aPTT and HL 6 hrs after rate change.   Dorothe Pea, PharmD, BCPS Clinical Pharmacist 10/17/2020 7:26 PM

## 2020-10-22 NOTE — Progress Notes (Addendum)
Initial Nutrition Assessment  DOCUMENTATION CODES:   Obesity unspecified  INTERVENTION:   Pivot 1.5 @40ml /hr + ProSource 63ml QID via tube   Free water flushes 37ml q4 hours to maintain tube patency   Regimen provides 1600kcal/day, 134g/day protein and 941ml/day free water   Pt at high refeed risk; recommend monitor potassium, magnesium and phosphorus labs daily until stable  Juven Fruit Punch BID via tube, each serving provides 95kcal and 2.5g of protein (amino acids glutamine and arginine)  MVI daily via tube   NUTRITION DIAGNOSIS:   Inadequate oral intake related to inability to eat (pt sedated and ventilated) as evidenced by NPO status.  GOAL:   Provide needs based on ASPEN/SCCM guidelines  MONITOR:   Vent status, Labs, Weight trends, Skin, I & O's  REASON FOR ASSESSMENT:   Ventilator    ASSESSMENT:   65 y/o female with h/o NSTEMI, CAD, GERD, IDDM, CABG x 4, CKD III, COVID 19, depression, Afib, CHF, PVD, substance abuse, roux-en-y gastric bypass in 01/2020 and chronic foot infections s/o toe amputations who is admitted with sepsis secondary to osteomyelitis from a diabetic foot infection and respiratory failure requiring intubation.  Pt s/p R LE angiography and thrombectomy 6/23  Pt sedated and ventilated. OGT in place. Plan is to start tube feeds today. Pt s/p Roux-en-y gastric bypass in 01/2020. Per chart, pt is down 51lbs(17%) since October. Per chart, pt will need I & D of foot wound.   Medications reviewed and include: aspirin, D3, plavix, colace, insulin, protonix, miralax, unasyn, heparin, vancomycin, NaCl @75ml /hr   Labs reviewed: K 3.6 wnl, creat 1.64(H), Mg 1.7 wnl Wbc- 29.0(H), Hgb 8.8(L), Hct 25.0(L), MCV 73.5(L), MCH 25.9(L) Cbgs- 204, 173, 200 x 24 hrs AIC 7.5(H)- 6/22  Patient is currently intubated on ventilator support MV: 11.1 L/min Temp (24hrs), Avg:97.1 F (36.2 C), Min:96.26 F (35.7 C), Max:98.7 F (37.1 C)  Propofol: none   MAP-  >72mmHg  UOP- 433ml   NUTRITION - FOCUSED PHYSICAL EXAM:  Flowsheet Row Most Recent Value  Orbital Region No depletion  Upper Arm Region No depletion  Thoracic and Lumbar Region No depletion  Buccal Region No depletion  Temple Region No depletion  Clavicle Bone Region No depletion  Clavicle and Acromion Bone Region No depletion  Scapular Bone Region No depletion  Dorsal Hand No depletion  Patellar Region No depletion  Anterior Thigh Region No depletion  Posterior Calf Region No depletion  Edema (RD Assessment) Mild  Hair Reviewed  Eyes Reviewed  Mouth Reviewed  Skin Reviewed  Nails Reviewed      Diet Order:   Diet Order             Diet NPO time specified  Diet effective ____                  EDUCATION NEEDS:   No education needs have been identified at this time  Skin:  Skin Assessment: Reviewed RN Assessment (R diabetic foot infection)  Last BM:  pta  Height:   Ht Readings from Last 1 Encounters:  10/10/2020 5\' 8"  (1.727 m)    Weight:   Wt Readings from Last 1 Encounters:  10/14/2020 110.7 kg    Ideal Body Weight:  63.6 kg  BMI:  Body mass index is 37.1 kg/m.  Estimated Nutritional Needs:   Kcal:  1217-1550kcal/day  Protein:  >127g/day  Fluid:  1.6-1.9L/day  Koleen Distance MS, RD, LDN Please refer to [facility] for RD and/or RD on-call/weekend/after hours pager

## 2020-10-22 NOTE — Progress Notes (Signed)
NAME:  Melinda [name], MRN:  [id number], DOB:  07/27/1955, LOS: 2 ADMISSION DATE:  10/05/2020, CONSULTATION DATE:  10/01/2020 REFERRING MD:  Judd Gaudier, MD CHIEF COMPLAINT:  Respiratory arrest, circulatory shock   History of Present Illness:  65 year old female with CAD s/p CABG, chronic systolic heart failure  EF 30-35%, PVD s/p stents in left SFA, atrial fibrillation, stage III CKD admitted to hospitalist for sepsis secondary to OM of right heel  Underwent right lower extremity angiography on 6/23. Post-procedure no immediate complications however this evening she complained of chest/abdominal pain. She then went into respiratory arrest and code blue called and she was intubated and transferred to the ICU. On arrival to the ICU she was noted to be in ventricular tachycardia without a pulse. CPR was initiated and ROSC was achieved after 5 minutes. She received epi, sodium bicarb, amiodarone. Levophed was started for hypotension. She then developed bradycardia and received atropine 1 mg with some response. She continued to have intermittent episodes of bradycardia so epi gtt was added.  Family at bedside. Updated on her critical condition.  Pertinent  Medical History  CAD s/p CABG, chronic systolic heart failure EF 30-35%, PVD s/p stents in left SFA, atrial fibrillation, stage III CKD   Significant Hospital Events: Including procedures, antibiotic start and stop dates in addition to other pertinent events   6/21 6/23: Suffered respiratory arrest with subsequent V. Tach arrest  6/24: Awake and able to follow commands, weaning down vasopressors, Cardiology consulted  Interim History / Subjective:  -Last night suffered respiratory arrest and then with V. Tach arrest upon arrival to ICU -This morning is awake and able to follow simple commands -Weaning pressors, Epi down to 0.5 -Cardiology consulted, ECHO pending  Objective   Blood pressure (!) 146/64, pulse 78, temperature 98.1 F (36.7  C), temperature source Axillary, resp. rate (!) 25, height _0  (1.727 m), weight 110.7 kg, SpO2 100 %.    Vent Mode: PRVC FiO2 (%):  [45 %-100 %] 45 % Set Rate:  [22 bmp-26 bmp] 26 bmp Vt Set:  [500 mL] 500 mL PEEP:  [10 cmH20] 10 cmH20 Plateau Pressure:  [25 cmH20] 25 cmH20   Intake/Output Summary (Last 24 hours) at 10/20/2020 0943 Last data filed at 10/05/2020 0600 Gross per 24 hour  Intake 500 ml  Output 475 ml  Net 25 ml    Filed Weights   10/09/2020 1416 10/16/2020 0801  Weight: 110.7 kg 110.7 kg   Physical Exam: General: Critically ill-appearing, intubated, no acute distress HENT: [place], AT, ETT in place Eyes: EOMI, no scleral icterus Respiratory: Mechanical  breath sounds bilaterally.  No crackles, wheezing or rales noted, overbreathing the vent, even Cardiovascular: RRR, -M/R/G, no JVD GI: BS+, soft, nontender, nondistended, no guarding or rebound tenderness Extremities:-Edema,-tenderness, s/p multiple left toe amputation, right heel ulcer Neuro: Awake, moves all extremities to commands, pupils PERRLA GU: Foley in place  Labs/imaging that I have personally reviewed  (right click and "Reselect all SmartList Selections" daily)  Labs 10/16/2020: glucose 210, BUN 22, Cr. 1.64, WBC 29, lactic acid 5.1, Hgb 8.8 ABG: 7.53 /29/114/24.2    CXR: ETT in place. R IJ CVC over SVC. No pneumothorax. Bilaterally interstitial opacities with worsening bases. Cardiomegaly (concerning for pulmonary edema)  Resolved Hospital Problem list     Assessment & Plan:   Cardiogenic shock +/- Septic Shock VT arrest Hx CABG, chronic systolic heart failure Atrial fib/flutter -Continuous cardiac monitoring -Maintain MAP >65 -Vasopressors as needed to maintain  MAP goal -Trend lactic acid -Continue Heparin and Amiodarone drips -Goal serum Potassium of 4 and Magnesium 2 -Continue ASA, Plavix, statin -Trend HS Troponin until peaked -Normothermia protocol -Echocardiogram pending -Cardiology  consulted, appreciate input ~ likely will need Cardia CATH prior to discharge -Diuresis as BP and renal function permits ~ currently holding diuresis due to vasopressors  Acute hypoxemic respiratory failure Pulmonary edema -Full vent support, implement lung protective strategies -Wean FiO2 & PEEP as tolerated to maintain O2 sats >92% -Follow intermittent CXR & ABG as needed -Spontaneous breathing trials when respiratory parameters met and mental status permits -Implement VAP bundle -PRN Bronchodilators -Diuresis as BP and renal function permits ~ currently holding diuresis due to vasopressors  Acute encephalopathy secondary to arrest Sedation needs in setting of mechanical ventilation -Maintain RASS goal of 0 to -1 -PRN fentanyl and versed pushes as needed -Avoid sedating meds as able -Daily wake up assessment -Normothermia protocol -Pt is currently awake and following simple commands, will hold off on CT Head and EEG at this time  Sepsis/septic shock secondary right foot OM -Monitor fever curve -Trend WBC's & Procalcitonin -Follow cultures -ID following, ABX as per ID ~ currently on Vancomycin & Unasyn -Vascular Surgery and Podiatry following, appreciate input -S/p Angioplasty, mechanical thrombectomy of RLE on 10/15/2020  Acute on chronic CKD III - cardiac arrest, recent contrast Lactic acidosis -Monitor I&O's / urinary output -Follow BMP -Ensure adequate renal perfusion -Avoid nephrotoxic agents as able -Replace electrolytes as indicated  DM2 -CBG's -SSI -Follow ICU Hypo/Hyperglycemia protocol   Best Practice (right click and "Reselect all SmartList Selections" daily)   Diet/type: NPO, start tube feeds Pain/Anxiety/Delirium protocol RASS goal: 0 to -1 VAP protocol (if indicated): Yes DVT prophylaxis: systemic heparin GI prophylaxis: PPI Glucose control:  SSI Central venous access:  Yes, and it is still needed Arterial line:  Yes, and it is still needed Foley:   Yes, and it is still needed Mobility:  bed rest  PT consulted: N/A Studies pending: Echocardiogram Culture data pending:blood Last reviewed culture data:today Antibiotics:unasyn and vanc Antibiotic de-escalation: no,  continue current rx Stop date: to be determined  Code Status:  full code Last date of multidisciplinary goals of care discussion [6/24]  ccm prognosis: Life-threating Disposition: remains critically ill, will stay in intensive care  Updated pt's sister at bedside 10/03/2020   Labs   CBC: Recent Labs  Lab 10/01/2020 1419 10/20/20 0930 10/02/2020 0519 10/07/2020 2030 10/07/2020 0000 10/14/2020 0620  WBC 14.7* 15.0* 13.6* 26.1* 24.9* 29.0*  NEUTROABS 12.0*  --   --   --   --   --   HGB 10.0* 9.7* 9.5* 9.0* 9.1* 8.8*  HCT 29.8* 29.0* 27.6* 27.5* 26.6* 25.0*  MCV 77.2* 76.7* 74.8* 79.0* 77.1* 73.5*  PLT 351 342 340 337 345 345     Basic Metabolic Panel: Recent Labs  Lab 10/14/2020 1419 10/20/20 0924 10/20/20 0930 10/13/2020 0519 10/27/2020 2030 10/06/2020 0000 10/18/2020 0620  NA 138  --  139 143 137  --  140  K 2.5*  --  3.2* 3.7 4.7  --  3.6  CL 98  --  100 105 103  --  105  CO2 31  --  31 32 17*  --  23  GLUCOSE 128*  --  158* 134* 218*  --  210*  BUN 11  --  _0 --  22  CREATININE 1.13*  --  1.10* 1.16* 1.51*  --  1.64*  CALCIUM 7.7*  --  7.9* 8.1* 7.8*  --  7.6*  MG  --  1.4*  --  2.0 2.4 2.0 1.7    GFR: Estimated Creatinine Clearance: 43.4 mL/min (A) (by C-G formula based on SCr of 1.64 mg/dL (H)). Recent Labs  Lab 10/20/20 0924 10/20/20 0930 10/07/2020 0519 09/30/2020 2030 10/28/2020 0000 10/16/2020 0050 10/05/2020 0620 10/16/2020 0645  PROCALCITON  --  <0.10  --   --   --   --   --   --   WBC  --  15.0* 13.6* 26.1* 24.9*  --  29.0*  --   LATICACIDVEN 1.7  --   --  >11.0*  --  8.3*  --  5.1*     Liver Function Tests: Recent Labs  Lab 10/02/2020 1419  AST 19  ALT 17  ALKPHOS 140*  BILITOT 1.5*  PROT 6.1*  ALBUMIN 2.1*    No results for input(s):  LIPASE, AMYLASE in the last 168 hours. No results for input(s): AMMONIA in the last 168 hours.  ABG    Component Value Date/Time   PHART 7.53 (H) 10/03/2020 0833   PCO2ART 29 (L) 10/03/2020 0833   PO2ART 114 (H) 10/12/2020 0833   HCO3 24.2 10/10/2020 0833   TCO2 22 06/09/2014 1822   ACIDBASEDEF 8.7 (H) 10/13/2020 2300   O2SAT 98.9 09/29/2020 0833      Coagulation Profile: Recent Labs  Lab 10/14/2020 1649 10/20/20 0930 10/15/2020 2030  INR 2.0* 1.9* 1.9*     Cardiac Enzymes: No results for input(s): CKTOTAL, CKMB, CKMBINDEX, TROPONINI in the last 168 hours.  HbA1C: Hemoglobin A1C  Date/Time Value Ref Range Status  06/02/2014 12:03 AM 11.5 (H) 4.2 - 6.3 % Final    Comment:    The American Diabetes Association recommends that a primary goal of therapy should be <7% and that physicians should reevaluate the treatment regimen in patients with HbA1c values consistently >8%.   01/19/2012 01:52 AM 13.5 (H) 4.2 - 6.3 % Final    Comment:    The American Diabetes Association recommends that a primary goal of therapy should be <7% and that physicians should reevaluate the treatment regimen in patients with HbA1c values consistently >8%.    Hgb A1c MFr Bld  Date/Time Value Ref Range Status  10/20/2020 09:24 AM 7.5 (H) 4.8 - 5.6 % Final    Comment:    (NOTE) Pre diabetes:          5.7%-6.4%  Diabetes:              >6.4%  Glycemic control for   <7.0% adults with diabetes   05/26/2020 08:24 AM 8.0 (H) 4.8 - 5.6 % Final    Comment:    (NOTE) Pre diabetes:          5.7%-6.4%  Diabetes:              >6.4%  Glycemic control for   <7.0% adults with diabetes     CBG: Recent Labs  Lab 10/24/2020 1920 10/07/2020 1938 10/01/2020 2325 10/01/2020 0411 10/13/2020 0722  GLUCAP 212* 197* 163* 204* 173*     Review of Systems:   Unable to obtain due to critical status  Past Medical History:  She,  has a past medical history of Anxiety, Atrial fibrillation ([place]), Atrial flutter  ([place]), Coronary artery disease, Depression, Diabetic neuropathy ([place]), GERD (gastroesophageal reflux disease), HTN (hypertension), Hyperlipidemia LDL goal <70, IDDM (insulin dependent diabetes mellitus), Ischemic cardiomyopathy, Myocardial infarction ([place]) (2016), OA (osteoarthritis) of knee, Obesity, Osteomyelitis ([place]),  Peripheral neuropathy, Peripheral vascular disease ([place]), and Tobacco abuse.   Surgical History:   Past Surgical History:  Procedure Laterality Date   ABDOMINAL AORTOGRAM W/LOWER EXTREMITY N/A 10/27/2016   Procedure: Abdominal Aortogram w/Lower Extremity;  Surgeon: Nelva Bush, MD;  Location: [facility];  Service: Cardiovascular;  Laterality: N/A;   AMPUTATION TOE Left 10/21/2015   Procedure: AMPUTATION TOE;  Surgeon: Sharlotte Alamo, DPM;  Location: [facility];  Service: Podiatry;  Laterality: Left;   AMPUTATION TOE Left 05/15/2017   Procedure: AMPUTATION LEFT GREAT TOE;  Surgeon: Sharlotte Alamo, DPM;  Location: [facility];  Service: Podiatry;  Laterality: Left;   AORTIC VALVE REPLACEMENT (AVR)/CORONARY ARTERY BYPASS GRAFTING (CABG)     BREAST BIOPSY     CARDIAC CATHETERIZATION  06/2014   95% stenosis mLAD, occlusion ostial OM1, 70% stenosis LCx, 95% stenosis mRCA, EF 45%.   CARDIOVERSION N/A 04/14/2020   Procedure: CARDIOVERSION with TEE;  Surgeon: Minna Merritts, MD;  Location: [facility];  Service: Cardiovascular;  Laterality: N/A;   CARDIOVERSION N/A 05/28/2020   Procedure: CARDIOVERSION;  Surgeon: Minna Merritts, MD;  Location: [facility];  Service: Cardiovascular;  Laterality: N/A;   CORONARY ARTERY BYPASS GRAFT N/A 06/08/2014   Procedure: CORONARY ARTERY BYPASS GRAFTING (CABG);  Surgeon: Grace Isaac, MD;  Location: [facility];  Service: Open Heart Surgery;  Laterality: N/A;  Times 4 using left internal mammary artery to LAD artery and endoscopically harvested bilateral saphenous vein to Obtuse Marginal, Diagonal and Posterior Descending coronary arteries.   CT ABD W &  PELVIS WO CM  06/2014   nl liver, gallbladder, spleen, mild diverticular changes, no bowel wall inflammation, appendix nl, no hernia, no other sig abnormalities   GASTRIC ROUX-EN-Y N/A 02/09/2020   Procedure: LAPAROSCOPIC ROUX-EN-Y GASTRIC BYPASS WITH UPPER ENDOSCOPY;  Surgeon: Johnathan Hausen, MD;  Location: [facility];  Service: General;  Laterality: N/A;   LOWER EXTREMITY ANGIOGRAPHY Left 05/23/2017   Procedure: LOWER EXTREMITY ANGIOGRAPHY;  Surgeon: Algernon Huxley, MD;  Location: [facility];  Service: Cardiovascular;  Laterality: Left;   LOWER EXTREMITY ANGIOGRAPHY Left 05/28/2017   Procedure: LOWER EXTREMITY ANGIOGRAPHY;  Surgeon: Algernon Huxley, MD;  Location: [facility];  Service: Cardiovascular;  Laterality: Left;   LOWER EXTREMITY ANGIOGRAPHY Left 12/16/2018   Procedure: LOWER EXTREMITY ANGIOGRAPHY;  Surgeon: Algernon Huxley, MD;  Location: [facility];  Service: Cardiovascular;  Laterality: Left;   LOWER EXTREMITY ANGIOGRAPHY Right 12/23/2018   Procedure: LOWER EXTREMITY ANGIOGRAPHY;  Surgeon: Algernon Huxley, MD;  Location: [facility];  Service: Cardiovascular;  Laterality: Right;   LOWER EXTREMITY ANGIOGRAPHY Right 10/05/2020   Procedure: Lower Extremity Angiography;  Surgeon: Algernon Huxley, MD;  Location: [facility];  Service: Cardiovascular;  Laterality: Right;   TEE WITHOUT CARDIOVERSION N/A 06/08/2014   Procedure: TRANSESOPHAGEAL ECHOCARDIOGRAM (TEE);  Surgeon: Grace Isaac, MD;  Location: [facility];  Service: Open Heart Surgery;  Laterality: N/A;   TEE WITHOUT CARDIOVERSION N/A 04/14/2020   Procedure: TRANSESOPHAGEAL ECHOCARDIOGRAM (TEE);  Surgeon: Minna Merritts, MD;  Location: [facility];  Service: Cardiovascular;  Laterality: N/A;   TOE AMPUTATION Right 12/2011   rt middle toe   UPPER GI ENDOSCOPY N/A 02/09/2020   Procedure: UPPER GI ENDOSCOPY;  Surgeon: Johnathan Hausen, MD;  Location: [facility];  Service: General;  Laterality: N/A;   US  ECHOCARDIOGRAPHY  06/2014   EF 50-55%, HK of inf/post/inferolat walls, Ao sclerosis     Social  History:   reports that she has quit smoking. Her smoking use included cigarettes. She has a 20.00 pack-year smoking history. She has never used smokeless tobacco. She reports previous drug use. She reports that she does not drink alcohol.   Family History:  Her family history includes Asthma in an other family member; CAD in her father and mother; Diabetes in her father and mother; Hypertension in her mother; Sickle cell trait in an other family member.   Allergies No Known Allergies   Home Medications  Prior to Admission medications   Medication Sig Start Date End Date Taking? Authorizing Provider  amiodarone (PACERONE) 200 MG tablet Take 1 tablet (200 mg total) by mouth 2 (two) times daily. 09/03/20  Yes Hendricks Limes, MD  amLODipine (NORVASC) 5 MG tablet Take 5 mg by mouth daily.   Yes [provider]  apixaban (ELIQUIS) 5 MG TABS tablet Take 1 tablet (5 mg total) by mouth 2 (two) times daily. 10/15/2020  Yes Vickie Epley, MD  atorvastatin (LIPITOR) 80 MG tablet Take 1 tablet (80 mg total) by mouth daily at 6 PM. 09/03/20  Yes Hendricks Limes, MD  Cholecalciferol (VITAMIN D3) 50 MCG (2000 UT) TABS Take 2,000 Units by mouth daily.   Yes [provider]  clotrimazole (LOTRIMIN) 1 % cream Apply 1 application topically 2 (two) times daily as needed.   Yes [provider]  docusate sodium (COLACE) 100 MG capsule Take 100 mg by mouth 2 (two) times daily.   Yes [provider]  DULoxetine (CYMBALTA) 60 MG capsule Take 1 capsule (60 mg total) by mouth daily. 09/03/20  Yes Hendricks Limes, MD  Emollient (CERAVE EX) Apply 1 application topically 2 (two) times daily as needed (dry skin).   Yes [provider]  furosemide (LASIX) 20 MG tablet Take 2 tablets (40 mg total) by mouth daily as needed (fluid retention.). 09/03/20  Yes Hendricks Limes, MD   insulin aspart (NOVOLOG) 100 UNIT/ML injection Inject 4 Units into the skin 3 (three) times daily with meals. 08/25/20  Yes Wieting, Richard, MD  metoprolol tartrate (LOPRESSOR) 50 MG tablet TAKE 1 TABLET TWICE DAILY Patient taking differently: Take 50 mg by mouth 2 (two) times daily. 09/24/20  Yes Vickie Epley, MD  oxybutynin (DITROPAN) 5 MG tablet Take 5 mg by mouth 2 (two) times daily.  05/10/17  Yes [provider]  pantoprazole (PROTONIX) 40 MG tablet Take 1 tablet (40 mg total) by mouth daily. 09/03/20  Yes Hendricks Limes, MD  potassium chloride SA (KLOR-CON) 20 MEQ tablet TAKE 1 TABLET (20 MEQ TOTAL) BY MOUTH DAILY AS NEEDED. Patient taking differently: Take 20 mEq by mouth daily as needed (potassium replacement with furosemide). 09/03/20  Yes Hendricks Limes, MD  pregabalin (LYRICA) 100 MG capsule Take 1 capsule (100 mg total) by mouth 3 (three) times daily. 09/08/20  Yes Medina-Vargas, Monina C, NP  Semaglutide,0.25 or 0.5MG/DOS, (OZEMPIC, 0.25 OR 0.5 MG/DOSE,) 2 MG/1.5ML SOPN Inject 0.5 mg into the skin every Friday.   Yes [provider]  tiZANidine (ZANAFLEX) 4 MG capsule Take 1 capsule (4 mg total) by mouth 3 (three) times daily as needed for muscle spasms. 09/06/20  Yes Medina-Vargas, Monina C, NP  diltiazem (CARDIZEM) 60 MG tablet Take 1 tablet (60 mg total) by mouth 2 (two) times daily. Patient not taking: No sig reported 09/03/20   Hendricks Limes, MD  insulin glargine (LANTUS) 100 UNIT/ML injection Inject 0.06 mLs (6 Units  total) into the skin at bedtime. Patient not taking: No sig reported 09/03/20   Hendricks Limes, MD     Critical care time: 40 min     The patient is critically ill with multiple organ systems failure and requires high complexity decision making for assessment and support, frequent evaluation and titration of therapies, application of advanced monitoring technologies and extensive interpretation of multiple databases.    Darel Hong,  AGACNP-BC [facility] Prefer epic messenger for cross cover needs If after hours, please call E-link

## 2020-10-22 NOTE — Procedures (Signed)
Routine EEG Report  Melinda [name] is a 65 y.o. female with a history of cardiac arrest who is undergoing an EEG to evaluate for seizures.  Report: This EEG was acquired with electrodes placed according to the International 10-20 electrode system (including Fp1, Fp2, F3, F4, C3, C4, P3, P4, O1, O2, T3, T4, T5, T6, A1, A2, Fz, Cz, Pz). The following electrodes were missing or displaced: none.  The background was continuous and composed primarily of theta frequencies 5-7 Hz. This activity is reactive to stimulation. Some sleep architecture (K complexes) was noted. There was no focal slowing. There were no interictal epileptiform discharges. There were no electrographic seizures identified. There was no abnormal response to photic stimulation. Hyperventilation was not performed 2/2 patient being on a ventilator.  Impression and clinical correlation: This EEG was obtained while awake and asleep and was abnormal due to moderate diffuse slowing. No epileptiform abnormalities were noted during this study.   Su Monks, MD [facility] [phone]  If 7pm- 7am, please page neurology on call as listed in [place].

## 2020-10-22 NOTE — Consult Note (Signed)
Cardiology Consultation:   Patient ID: Melinda [name]; [id number]; 1956-03-16   Admit date: 09/30/2020 Date of Consult: 10/04/2020  Primary Care Provider: Donnie Coffin, MD Primary Cardiologist: Rockey Situ Primary Electrophysiologist:  Quentin Ore   Patient Profile:   Melinda [name] is a 65 y.o. 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 Dewaine Conger.  History of Present Illness:   Melinda [name] was admitted to the hospital in 06/2014 with NSTEMI.  LHC showed severe multivessel CAD.  In this setting she was transferred to [facility] where she underwent four-vessel CABG.  Echo showed an EF of 40 to 45%, severe hypokinesis of the entire inferolateral and inferior myocardium, grade 2 diastolic dysfunction, and mild mitral regurgitation.  Repeat echo in 03/2020 showed a persistent cardiomyopathy with an EF of 35 to 40%, moderate hypokinesis of the basal mid inferior wall and inferolateral wall, grade 1 diastolic dysfunction, mildly reduced RV systolic function with normal ventricular cavity size, mild biatrial enlargement, trivial mitral regurgitation, and mild to moderate aortic valve sclerosis without evidence of stenosis.  With regards to her atrial flutter, she was diagnosed in 03/2020 at the time of a Lexiscan MPI.  She has failed DCCV twice (TEE/DCCV in 03/2020), and most recently in 05/2020 while on amiodarone with sinus rhythm holding  for approximately 3 days.  She was evaluated by EP in 06/2020 and remained in symptomatic atrial flutter with 2-1 AV block.  In the setting of her cardiomyopathy and symptomatic atrial flutter, rhythm control strategy was recommended with plans to move forward with EP study and ablation.  She was admitted to the hospital in 07/2020 after sustaining a fall on the buss and was found to have acute hypoxic respiratory failure requiring BiPAP secondary to aspiration pneumonia as well as acute on chronic HFrEF.  Echo showed an EF of 30 to 35%, global hypokinesis, normal RV systolic function and ventricular cavity size, normal PASP, moderately dilated left atrium, and mild mitral regurgitation.  She was treated with IV antibiotics and diuretics with symptomatic improvement.  She was readmitted in late 07/2020, with recurrent fall.  She was seen in the ED on 09/25/2020 with another fall.  It does not appear she suffered LOC with these events. She was seen again in the ED on 10/07/2020 with SOB and swelling. HS-Tn negative x 2, BNP 579. She was treated with IV Lasix and discharged to outpatient follow up.   She was admitted to the hospital on 10/18/2020 with worsening right heel ulcer.  She underwent lower extremity angiography on 6/23 with mechanical thrombectomy of the right popliteal, angioplasty of the popliteal, right peroneal, and tibial peroneal trunk, and right ATA.  There were no immediate postprocedure complications.    Her sister was with her on the evening of 6/23 when the patient reported feeling fatigued and noted some abdominal discomfort.  In this setting the sister left to allow the patient to get  some rest.  On the evening of 6/23 at approximately 8 PM CODE BLUE was called with the patient minimally responsive with agonal respirations with notes indicating she never lost a pulse.  BP 700 systolic.  She was emergently intubated.  Chest compressions were not required.  Telemetry showed a development of WCT  concerning for monomorphic VT developing around 2000 persisting until 2019.  EKG showed A. fib with RVR with aberrancy and with a new right bundle, rare PVC, and nonspecific ST-T changes.  High-sensitivity troponin checked overnight had trended to 27 with prior values being normal.  Repeat high-sensitivity troponin is pending this morning.  Magnesium 2.0.  Potassium 4.7 trending to 3.6 this morning which was improved from prior of 2.5 on 6/21.  She has been placed on a heparin and amiodarone drip.  She briefly required vasopressor support with epinephrine and Levophed which have subsequently been discontinued with stable BPs.  Currently maintaining sinus rhythm and remains intubated.  In discussing her case with the sister that was present yesterday, the patient did not voice concerns of chest discomfort, shortness of breath, or dizziness preceding her CODE BLUE.   Past Medical History:  Diagnosis Date   Anxiety    Atrial fibrillation (HCC)    Atrial flutter ([place])    Coronary artery disease    a. 06/2014 NSTEMI s/p CABG x 4 (LIMA to LAD, VG to Diag, VG to OM, VG to PDA).   Depression    Diabetic neuropathy (HCC)    GERD (gastroesophageal reflux disease)    HTN (hypertension)    Hyperlipidemia LDL goal <70    IDDM (insulin dependent diabetes mellitus)    Ischemic cardiomyopathy    a. 06/2014 Echo: EF 40-45%, HK of entire inferolateral and inferior myocardium c/w infarct of RCA/LCx, GR2DD, mild MR   Myocardial infarction ([place]) 2016   OA (osteoarthritis) of knee    Obesity    Osteomyelitis ([place])    Peripheral neuropathy    Peripheral vascular disease ([place])    a. 09/2016 Periph Angio: CTO R popliteal, CTO L prox/mid SFA->Med Rx; b. 05/2017 PTA: LSFA (Viabahn covered stent x 2), DEB to L Post Tibial; c. 06/2017 ABI: R 0.44, L 1.05.   Tobacco abuse     Past Surgical History:  Procedure Laterality Date   ABDOMINAL AORTOGRAM W/LOWER EXTREMITY N/A 10/27/2016   Procedure: Abdominal Aortogram w/Lower  Extremity;  Surgeon: Nelva Bush, MD;  Location: [place] [facility];  Service: Cardiovascular;  Laterality: N/A;   AMPUTATION TOE Left 10/21/2015   Procedure: AMPUTATION TOE;  Surgeon: Sharlotte Alamo, DPM;  Location: [facility];  Service: Podiatry;  Laterality: Left;   AMPUTATION TOE Left 05/15/2017   Procedure: AMPUTATION LEFT GREAT TOE;  Surgeon: Sharlotte Alamo, DPM;  Location: [facility];  Service: Podiatry;  Laterality: Left;   AORTIC VALVE REPLACEMENT (AVR)/CORONARY ARTERY BYPASS GRAFTING (CABG)     BREAST BIOPSY     CARDIAC CATHETERIZATION  06/2014   95% stenosis mLAD, occlusion ostial OM1, 70% stenosis LCx, 95% stenosis mRCA, EF 45%.   CARDIOVERSION N/A 04/14/2020   Procedure: CARDIOVERSION with TEE;  Surgeon: Minna Merritts, MD;  Location: [facility];  Service: Cardiovascular;  Laterality: N/A;   CARDIOVERSION N/A 05/28/2020   Procedure: CARDIOVERSION;  Surgeon: Minna Merritts, MD;  Location: [facility];  Service: Cardiovascular;  Laterality: N/A;   CORONARY ARTERY BYPASS GRAFT N/A 06/08/2014   Procedure: CORONARY ARTERY BYPASS GRAFTING (CABG);  Surgeon: Grace Isaac, MD;  Location: [place];  Service:  Open Heart Surgery;  Laterality: N/A;  Times 4 using left internal mammary artery to LAD artery and endoscopically harvested bilateral saphenous vein to Obtuse Marginal, Diagonal and Posterior Descending coronary arteries.   CT ABD W & PELVIS WO CM  06/2014   nl liver, gallbladder, spleen, mild diverticular changes, no bowel wall inflammation, appendix nl, no hernia, no other sig abnormalities   GASTRIC ROUX-EN-Y N/A 02/09/2020   Procedure: LAPAROSCOPIC ROUX-EN-Y GASTRIC BYPASS WITH UPPER ENDOSCOPY;  Surgeon: Johnathan Hausen, MD;  Location: [facility];  Service: General;  Laterality: N/A;   LOWER EXTREMITY ANGIOGRAPHY Left 05/23/2017   Procedure: LOWER EXTREMITY ANGIOGRAPHY;  Surgeon: Algernon Huxley, MD;  Location: [place] [facility];  Service: Cardiovascular;  Laterality: Left;   LOWER EXTREMITY  ANGIOGRAPHY Left 05/28/2017   Procedure: LOWER EXTREMITY ANGIOGRAPHY;  Surgeon: Algernon Huxley, MD;  Location: [place] [facility];  Service: Cardiovascular;  Laterality: Left;   LOWER EXTREMITY ANGIOGRAPHY Left 12/16/2018   Procedure: LOWER EXTREMITY ANGIOGRAPHY;  Surgeon: Algernon Huxley, MD;  Location: [place] [facility];  Service: Cardiovascular;  Laterality: Left;   LOWER EXTREMITY ANGIOGRAPHY Right 12/23/2018   Procedure: LOWER EXTREMITY ANGIOGRAPHY;  Surgeon: Algernon Huxley, MD;  Location: [place] [facility];  Service: Cardiovascular;  Laterality: Right;   LOWER EXTREMITY ANGIOGRAPHY Right 10/13/2020   Procedure: Lower Extremity Angiography;  Surgeon: Algernon Huxley, MD;  Location: [place] [facility];  Service: Cardiovascular;  Laterality: Right;   TEE WITHOUT CARDIOVERSION N/A 06/08/2014   Procedure: TRANSESOPHAGEAL ECHOCARDIOGRAM (TEE);  Surgeon: Grace Isaac, MD;  Location: [place];  Service: Open Heart Surgery;  Laterality: N/A;   TEE WITHOUT CARDIOVERSION N/A 04/14/2020   Procedure: TRANSESOPHAGEAL ECHOCARDIOGRAM (TEE);  Surgeon: Minna Merritts, MD;  Location: [facility];  Service: Cardiovascular;  Laterality: N/A;   TOE AMPUTATION Right 12/2011   rt middle toe   UPPER GI ENDOSCOPY N/A 02/09/2020   Procedure: UPPER GI ENDOSCOPY;  Surgeon: Johnathan Hausen, MD;  Location: [facility];  Service: General;  Laterality: N/A;   US ECHOCARDIOGRAPHY  06/2014   EF 50-55%, HK of inf/post/inferolat walls, Ao sclerosis     Home Meds: Prior to Admission medications   Medication Sig Start Date End Date Taking? Authorizing Provider  amiodarone (PACERONE) 200 MG tablet Take 1 tablet (200 mg total) by mouth 2 (two) times daily. 09/03/20  Yes Hendricks Limes, MD  amLODipine (NORVASC) 5 MG tablet Take 5 mg by mouth daily.   Yes [provider]  apixaban (ELIQUIS) 5 MG TABS tablet Take 1 tablet (5 mg total) by mouth 2 (two) times daily. 10/03/2020  Yes Vickie Epley, MD  atorvastatin (LIPITOR) 80  MG tablet Take 1 tablet (80 mg total) by mouth daily at 6 PM. 09/03/20  Yes Hendricks Limes, MD  Cholecalciferol (VITAMIN D3) 50 MCG (2000 UT) TABS Take 2,000 Units by mouth daily.   Yes [provider]  clotrimazole (LOTRIMIN) 1 % cream Apply 1 application topically 2 (two) times daily as needed.   Yes [provider]  docusate sodium (COLACE) 100 MG capsule Take 100 mg by mouth 2 (two) times daily.   Yes [provider]  DULoxetine (CYMBALTA) 60 MG capsule Take 1 capsule (60 mg total) by mouth daily. 09/03/20  Yes Hendricks Limes, MD  Emollient (CERAVE EX) Apply 1 application topically 2 (two) times daily as needed (dry skin).   Yes [provider]  furosemide (LASIX) 20 MG tablet Take 2 tablets (  40 mg total) by mouth daily as needed (fluid retention.). 09/03/20  Yes Hendricks Limes, MD  insulin aspart (NOVOLOG) 100 UNIT/ML injection Inject 4 Units into the skin 3 (three) times daily with meals. 08/25/20  Yes Wieting, Richard, MD  metoprolol tartrate (LOPRESSOR) 50 MG tablet TAKE 1 TABLET TWICE DAILY Patient taking differently: Take 50 mg by mouth 2 (two) times daily. 09/24/20  Yes Vickie Epley, MD  oxybutynin (DITROPAN) 5 MG tablet Take 5 mg by mouth 2 (two) times daily.  05/10/17  Yes [provider]  pantoprazole (PROTONIX) 40 MG tablet Take 1 tablet (40 mg total) by mouth daily. 09/03/20  Yes Hendricks Limes, MD  potassium chloride SA (KLOR-CON) 20 MEQ tablet TAKE 1 TABLET (20 MEQ TOTAL) BY MOUTH DAILY AS NEEDED. Patient taking differently: Take 20 mEq by mouth daily as needed (potassium replacement with furosemide). 09/03/20  Yes Hendricks Limes, MD  pregabalin (LYRICA) 100 MG capsule Take 1 capsule (100 mg total) by mouth 3 (three) times daily. 09/08/20  Yes Medina-Vargas, Monina C, NP  Semaglutide,0.25 or 0.5MG /DOS, (OZEMPIC, 0.25 OR 0.5 MG/DOSE,) 2 MG/1.5ML SOPN Inject 0.5 mg into the skin every Friday.   Yes [provider]   tiZANidine (ZANAFLEX) 4 MG capsule Take 1 capsule (4 mg total) by mouth 3 (three) times daily as needed for muscle spasms. 09/06/20  Yes Medina-Vargas, Monina C, NP  diltiazem (CARDIZEM) 60 MG tablet Take 1 tablet (60 mg total) by mouth 2 (two) times daily. Patient not taking: No sig reported 09/03/20   Hendricks Limes, MD  insulin glargine (LANTUS) 100 UNIT/ML injection Inject 0.06 mLs (6 Units total) into the skin at bedtime. Patient not taking: No sig reported 09/03/20   Hendricks Limes, MD    Inpatient Medications: Scheduled Meds:  acetaminophen  650 mg Oral Q4H   Or   acetaminophen (TYLENOL) oral liquid 160 mg/5 mL  650 mg Per Tube Q4H   Or   acetaminophen  650 mg Rectal Q4H   aspirin EC  81 mg Oral Daily   atorvastatin  80 mg Oral QHS   chlorhexidine  60 mL Topical Once   Chlorhexidine Gluconate Cloth  6 each Topical Daily   cholecalciferol  2,000 Units Oral Daily   clopidogrel  75 mg Oral Daily   docusate  100 mg Per Tube BID   DULoxetine  60 mg Oral Daily   insulin aspart  0-15 Units Subcutaneous TID WC   insulin aspart  0-5 Units Subcutaneous QHS   insulin glargine  6 Units Subcutaneous QHS   oxybutynin  5 mg Oral BID   pantoprazole (PROTONIX) IV  40 mg Intravenous QHS   polyethylene glycol  17 g Per Tube Daily   povidone-iodine  2 application Topical Once   pregabalin  100 mg Oral TID   Continuous Infusions:  sodium chloride 75 mL/hr at 10/05/2020 1259   amiodarone 30 mg/hr (10/15/2020 0321)   ampicillin-sulbactam (UNASYN) IV Stopped (10/13/2020 0552)   epinephrine 1 mcg/min (10/05/2020 2000)   heparin 1,500 Units/hr (09/30/2020 0748)   norepinephrine (LEVOPHED) Adult infusion 2 mcg/min (10/06/2020 2000)   sodium chloride     [START ON 10/23/2020] vancomycin     PRN Meds: acetaminophen **OR** acetaminophen, fentaNYL (SUBLIMAZE) injection, fentaNYL (SUBLIMAZE) injection, HYDROmorphone (DILAUDID) injection, morphine injection, ondansetron **OR** ondansetron (ZOFRAN) IV,  ondansetron (ZOFRAN) IV, tiZANidine  Allergies:  No Known Allergies  Social History:   Social History   Socioeconomic History   Marital status: Widowed  Spouse name: Not on file   Number of children: Not on file   Years of education: Not on file   Highest education level: Not on file  Occupational History   Not on file  Tobacco Use   Smoking status: Former    Packs/day: 0.50    Years: 40.00    Pack years: 20.00    Types: Cigarettes   Smokeless tobacco: Never   Tobacco comments:    started Chantix 04/12/20  Vaping Use   Vaping Use: Never used  Substance and Sexual Activity   Alcohol use: No    Alcohol/week: 0.0 standard drinks   Drug use: Not Currently    Comment: crack   Sexual activity: Not on file  Other Topics Concern   Not on file  Social History Narrative   Not on file   Social Determinants of Health   Financial Resource Strain: Not on file  Food Insecurity: Not on file  Transportation Needs: Not on file  Physical Activity: Not on file  Stress: Not on file  Social Connections: Not on file  Intimate Partner Violence: Not on file     Family History:   Family History  Problem Relation Age of Onset   CAD Mother    Hypertension Mother    Diabetes Mother    CAD Father    Diabetes Father    Sickle cell trait Other    Asthma Other     ROS:  Review of Systems  Unable to perform ROS: Intubated     Physical Exam/Data:   Vitals:   10/20/2020 0600 10/03/2020 0700 10/28/2020 0739 10/23/2020 0743  BP:    134/61  Pulse: 76 78  80  Resp: (!) 26 (!) 26  (!) 26  Temp: (!) 96.44 F (35.8 C) (!) 96.26 F (35.7 C)  98.1 F (36.7 C)  TempSrc:    Axillary  SpO2: 100% 100% 100% 100%  Weight:      Height:        Intake/Output Summary (Last 24 hours) at 10/27/2020 0930 Last data filed at 10/17/2020 0600 Gross per 24 hour  Intake 500 ml  Output 495 ml  Net 5 ml   Filed Weights   10/04/2020 1416 10/20/2020 0801  Weight: 110.7 kg 110.7 kg   Body mass index is  37.1 kg/m.   Physical Exam: General: Well developed, well nourished, in no acute distress.  Critically ill-appearing. Head: Normocephalic, atraumatic, sclera non-icteric, no xanthomas, nares without discharge.  Neck: Negative for carotid bruits. JVD unable to be assessed secondary to respiratory support apparatus. Lungs: Vented breath sounds bilaterally. Heart: RRR with S1 S2. I/VI systolic murmur, no rubs, or gallops appreciated. Abdomen: Soft, non-tender, non-distended with normoactive bowel sounds. No hepatomegaly. No rebound/guarding. No obvious abdominal masses. Msk:  Strength and tone appear normal for age.  Dressing noted along lower extremity. Extremities: No clubbing or cyanosis. No edema. Distal pedal pulses are 2+ and equal bilaterally. Neuro: Intubated, will open eyes. Psych: Intubated, will open eyes.   EKG:  The EKG was personally reviewed and demonstrates: Afib with RVR with aberrancy and with a new right bundle, rare PVC, and nonspecific ST-T changes Telemetry:  Telemetry was personally reviewed and demonstrates: WCT developing at 20:10 on 6/23 persisting until 20:19.  Currently in sinus rhythm.  Weights: Filed Weights   10/09/2020 1416 10/24/2020 0801  Weight: 110.7 kg 110.7 kg    Relevant CV Studies:  2D echo 10/18/2020: Pending __________  2D echo 08/12/2020: 1. Left ventricular  ejection fraction, by estimation, is 30 to 35%. The  left ventricle has moderately decreased function. The left ventricle  demonstrates global hypokinesis. Left ventricular diastolic parameters are  indeterminate. The average left  ventricular global longitudinal strain is -12.5 %. The global longitudinal  strain is abnormal.   2. Right ventricular systolic function is normal. The right ventricular  size is normal. There is normal pulmonary artery systolic pressure.   3. Left atrial size was moderately dilated.   4. The mitral valve is normal in structure. Mild mitral valve   regurgitation.  __________  TEE 04/14/2020: 1. Left ventricular ejection fraction, by estimation, is 50 to 55%. The  left ventricle has low normal function. The left ventricle has no regional  wall motion abnormalities.   2. Right ventricular systolic function is normal. The right ventricular  size is normal.   3. No left atrial/left atrial appendage thrombus was detected.   4. The mitral valve is normal in structure. Mild to moderate mitral valve  regurgitation. No evidence of mitral stenosis.   5. The aortic valve is normal in structure. Aortic valve regurgitation is  not visualized. No aortic stenosis is present.   6. There is mild (Grade II) plaque involving the transverse aorta and  descending aorta.   7. The inferior vena cava is normal in size with greater than 50%  respiratory variability, suggesting right atrial pressure of 3 mmHg.   8. Agitated saline contrast bubble study was negative, with no evidence  of any interatrial shunt.   Conclusion(s)/Recommendation(s): Normal biventricular function without  evidence of hemodynamically significant valvular heart disease. __________  2D echo 03/02/2020:  1. Left ventricular ejection fraction, by estimation, is 35 to 40%. The  left ventricle has moderately decreased function. The left ventricle  demonstrates regional wall motion abnormalities (see scoring  diagram/findings for description). Left ventricular   diastolic parameters are consistent with Grade I diastolic dysfunction  (impaired relaxation). There is moderate hypokinesis of the left  ventricular, basal-mid inferior wall and inferolateral wall.   2. Right ventricular systolic function is mildly reduced. The right  ventricular size is normal. Tricuspid regurgitation signal is inadequate  for assessing PA pressure.   3. Left atrial size was mildly dilated.   4. Right atrial size was mildly dilated.   5. The mitral valve is normal in structure. Trivial mitral valve   regurgitation. No evidence of mitral stenosis. Moderate mitral annular  calcification.   6. The aortic valve is tricuspid. There is mild calcification of the  aortic valve. There is mild thickening of the aortic valve. Aortic valve  regurgitation is not visualized. Mild to moderate aortic valve  sclerosis/calcification is present, without any  evidence of aortic stenosis.   7. The inferior vena cava is normal in size with <50% respiratory  variability, suggesting right atrial pressure of 8 mmHg. __________  2D echo 06/04/2014: - Left ventricle: The cavity size was normal. There was moderate    concentric hypertrophy. Systolic function was mildly to    moderately reduced. The estimated ejection fraction was in the    range of 40% to 45%. Severe hypokinesis of the    entireinferolateral and inferior myocardium; consistent with    infarction in the distribution of the right coronary or left    circumflex coronary artery. Features are consistent with a    pseudonormal left ventricular filling pattern, with concomitant    abnormal relaxation and increased filling pressure (grade 2    diastolic dysfunction).  -  Mitral valve: There was mild regurgitation.   Laboratory Data:  Chemistry Recent Labs  Lab 10/18/2020 0519 10/18/2020 2030 10/06/2020 0620  NA 143 137 140  K 3.7 4.7 3.6  CL 105 103 105  CO2 32 17* 23  GLUCOSE 134* 218* 210*  BUN 14 18 22   CREATININE 1.16* 1.51* 1.64*  CALCIUM 8.1* 7.8* 7.6*  GFRNONAA 52* 38* 34*  ANIONGAP 6 17* 12    Recent Labs  Lab 10/18/2020 1419  PROT 6.1*  ALBUMIN 2.1*  AST 19  ALT 17  ALKPHOS 140*  BILITOT 1.5*   Hematology Recent Labs  Lab 10/09/2020 2030 10/02/2020 0000 10/14/2020 0620  WBC 26.1* 24.9* 29.0*  RBC 3.48* 3.45* 3.40*  HGB 9.0* 9.1* 8.8*  HCT 27.5* 26.6* 25.0*  MCV 79.0* 77.1* 73.5*  MCH 25.9* 26.4 25.9*  MCHC 32.7 34.2 35.2  RDW 20.3* 19.8* 19.2*  PLT 337 345 345   Cardiac EnzymesNo results for input(s): TROPONINI in the  last 168 hours. No results for input(s): TROPIPOC in the last 168 hours.  BNPNo results for input(s): BNP, PROBNP in the last 168 hours.  DDimer No results for input(s): DDIMER in the last 168 hours.  Radiology/Studies:  DG Os Calcis Right  Result Date: 10/07/2020 IMPRESSION: No acute skeletal abnormality. Electronically Signed   By: Franchot Gallo M.D.   On: 10/16/2020 16:24   MR ANKLE RIGHT WO CONTRAST  Result Date: 10/20/2020 IMPRESSION: Lateral hindfoot ulcer with underlying osteomyelitis of the inferolateral calcaneus, subcutaneous soft tissue gas and edema. No no well-defined soft tissue abscess. Focal mild tenosynovitis of the peroneal longus and brevis tendons just below the lateral malleolus. Electronically Signed   By: Maurine Simmering   On: 10/20/2020 07:19   PERIPHERAL VASCULAR CATHETERIZATION  Result Date: 10/05/2020 See surgical note for result.  DG Chest Port 1 View  Result Date: 10/04/2020 IMPRESSION: 1. Tip of the endotracheal tube approximately 2.6 cm from the carina. Right internal jugular central venous catheter tip projects over the SVC. No pneumothorax. 2. Diffuse interstitial opacities with slight worsening at the lung bases from earlier today, suspicious for pulmonary edema. Probable pleural effusions. Stable cardiomegaly post CABG. Electronically Signed   By: Keith Rake M.D.   On: 09/30/2020 21:23   DG Chest Port 1 View  Result Date: 10/20/2020 IMPRESSION: 1.  Prior CABG.  Cardiomegaly. 2.  Diffuse bilateral pulmonary infiltrates/edema. Electronically Signed   By: [place]   On: 10/20/2020 10:26    Assessment and Plan:   1. Cardiopulmonary arrest: -She developed WCT concerning for monomorphic VT around 8 PM on 6/23, which persisted until 8:19 PM. Code BLUE was called due to agonal breathing and minimal responsiveness. She was emergently intubated and did not require chest compressions. Notes indicate she never lost a pulse. In reviewing telemetry, she  does have brief spikes in her A-line during the above when it appears there may have been a conducted beat towards the end of her arrhythmia  -Agree with heparin and amiodarone gtts -Cycle HS-Tn until peak -Echo pending -She will need a cath prior to discharge, possibly early next week -Will need to discuss risks/benefits of cardiac cath with patient and family  -Maintain potassium and magnesium at goal 4.0 and 2.0, respectively  -May be beneficial to have EP round on her next week if they are on site   2. CAD s/p CABG with elevated HS-Tn: -Minimal troponin elevation at this time, not consistent with ACS and likely supply demand ischemia in  the setting of the above in combination with her acute illness and CKD -Continue to cycle troponin until peak -ASA/Plavix -Lipitor  -Will need cath as above prior to discharge   3. HFrEF: -Prior EF 30-35% -Preliminary EF per echo tech low normal -Resume and escalate GDMT as BP allows, previously required vasopressor support briefly in the setting of the above -Echo pending  4. PAF/persistent atrial flutter: -Currently in sinus rhythm -IV amiodarone -IV heparin, resume OAC prior to discharge once it is clear she will not require invasive procedures -CHADS2VASc 6 (CHF, HTN, age x 1, DM, vascular disease, sex category) -Follow up with EP as an outpatient   For questions or updates, please contact [facility] Please consult [email or web address] for contact info under Cardiology/STEMI.   Signed, Christell Faith, [name] [place] Pager: [phone] 10/18/2020, 9:30 AM

## 2020-10-22 NOTE — Progress Notes (Signed)
Pt status labile post CODE blue. Requiring pressure support,. Trending labs, titrating drips as required. 515ml NS bolus given. Pt neuro improving, not following commands, responds to painful stimuli and voice. Pt family updated and at bedside.   Bedside handoff given. Relinquishing care.

## 2020-10-22 NOTE — H&P (View-Only) (Signed)
[facility] Daily Progress Note  10/05/2020:             1.  Ultrasound guidance for vascular access left femoral artery             2.  Catheter placement into right common femoral artery from left femoral approach             3.  Aortogram and selective right lower extremity angiogram             4.  Percutaneous transluminal angioplasty of right peroneal artery and tibioperoneal trunk with 2.5 mm diameter by 15 cm length angioplasty balloon             5.  Mechanical thrombectomy of the right popliteal artery with the Rota Rex device             6.  Percutaneous transluminal angioplasty of the right popliteal artery with 4 mm diameter by 10 cm length Lutonix drug-coated angioplasty balloon             7.  Percutaneous transluminal angioplasty of the right anterior tibial artery with 2 inflations with a 3 mm diameter by 10 cm length angioplasty balloon             8.  StarClose closure device left femoral artery  Subjective: Patient is intubated and sedated.  Family at bedside.  Had rapid response yesterday evening for respiratory arrest however pulse was never lost.  She is now intubated sedated on pressors in the ICU.  Objective: Vitals:   10/18/2020 0743 10/05/2020 0800 10/24/2020 0900 10/12/2020 1000  BP: 134/61 134/63 (!) 146/64 136/76  Pulse: 80 80 78 73  Resp: (!) 26 (!) 24 (!) 25 16  Temp: 98.1 F (36.7 C)   98 F (36.7 C)  TempSrc: Axillary   Axillary  SpO2: 100% 100% 100% 100%  Weight:      Height:        Intake/Output Summary (Last 24 hours) at 10/24/2020 1236 Last data filed at 10/11/2020 0600 Gross per 24 hour  Intake --  Output 475 ml  Net -475 ml   Physical Exam: A&Ox3, NAD CV: RRR Pulmonary: CTA Bilaterally Abdomen: Soft, Nontender, Nondistended Left groin access site:  Clean dry and intact.  No swelling or drainage. Vascular:  Right lower extremity: Thigh soft.  Calf soft.  Extremity is relatively warm distally however does become cooler  towards the toes.  Dressing intact clean and dry.   Laboratory: CBC    Component Value Date/Time   WBC 29.0 (H) 10/01/2020 0620   HGB 8.8 (L) 10/13/2020 0620   HGB 13.1 10/10/2016 1911   HCT 25.0 (L) 10/02/2020 0620   HCT 40.6 10/10/2016 1911   PLT 345 10/04/2020 0620   PLT 301 10/10/2016 1911   BMET    Component Value Date/Time   NA 140 09/30/2020 0620   NA 139 10/10/2016 1911   NA 140 06/03/2014 0405   K 3.6 10/14/2020 0620   K 4.1 06/03/2014 0405   CL 105 10/18/2020 0620   CL 107 06/03/2014 0405   CO2 23 10/17/2020 0620   CO2 26 06/03/2014 0405   GLUCOSE 210 (H) 10/15/2020 0620   GLUCOSE 227 (H) 06/03/2014 0405   BUN 22 10/24/2020 0620   BUN 16 10/10/2016 1911   BUN 13 06/03/2014 0405   CREATININE 1.64 (H) 10/18/2020 0620   CREATININE 1.13 06/03/2014 0405   CALCIUM 7.6 (L) 10/02/2020 0620   CALCIUM  8.1 (L) 06/03/2014 0405   GFRNONAA 34 (L) 10/03/2020 0620   GFRNONAA 52 (L) 06/03/2014 0405   GFRNONAA 54 (L) 11/28/2013 1754   GFRAA 51 (L) 02/02/2020 1110   GFRAA >60 06/03/2014 0405   GFRAA >60 11/28/2013 1754   Assessment/Planning: The patient is a 65 year old female with known history of atherosclerotic disease with gangrenous changes to the right lower extremity status post right lower extremity angiogram with intervention - POD#1  1) rapid response called yesterday evening for respiratory arrest.  Patient is now intubated, sedated and on pressors in the ICU.  2) now that the patient is on pressors we will have to monitor the right lower extremity / recent intervention for continued arterial patency.  Patient remains on heparin drip. 3) will continue to follow  Discussed with Dr. Ellis Parents [facility] [name] 10/16/2020 12:36 PM

## 2020-10-23 ENCOUNTER — Inpatient Hospital Stay: Payer: Medicare HMO

## 2020-10-23 DIAGNOSIS — J810 Acute pulmonary edema: Secondary | ICD-10-CM

## 2020-10-23 DIAGNOSIS — N189 Chronic kidney disease, unspecified: Secondary | ICD-10-CM

## 2020-10-23 DIAGNOSIS — I472 Ventricular tachycardia: Secondary | ICD-10-CM

## 2020-10-23 DIAGNOSIS — R000 Tachycardia, unspecified: Secondary | ICD-10-CM

## 2020-10-23 DIAGNOSIS — I5021 Acute systolic (congestive) heart failure: Secondary | ICD-10-CM

## 2020-10-23 DIAGNOSIS — N170 Acute kidney failure with tubular necrosis: Secondary | ICD-10-CM

## 2020-10-23 LAB — BLOOD GAS, ARTERIAL
Acid-Base Excess: 4 mmol/L — ABNORMAL HIGH (ref 0.0–2.0)
Bicarbonate: 27.1 mmol/L (ref 20.0–28.0)
FIO2: 0.3
MECHVT: 500 mL
O2 Saturation: 93.6 %
PEEP: 5 cmH2O
Patient temperature: 37
RATE: 20 resp/min
pCO2 arterial: 34 mmHg (ref 32.0–48.0)
pH, Arterial: 7.51 — ABNORMAL HIGH (ref 7.350–7.450)
pO2, Arterial: 62 mmHg — ABNORMAL LOW (ref 83.0–108.0)

## 2020-10-23 LAB — GLUCOSE, CAPILLARY
Glucose-Capillary: 189 mg/dL — ABNORMAL HIGH (ref 70–99)
Glucose-Capillary: 216 mg/dL — ABNORMAL HIGH (ref 70–99)
Glucose-Capillary: 223 mg/dL — ABNORMAL HIGH (ref 70–99)
Glucose-Capillary: 226 mg/dL — ABNORMAL HIGH (ref 70–99)
Glucose-Capillary: 233 mg/dL — ABNORMAL HIGH (ref 70–99)
Glucose-Capillary: 237 mg/dL — ABNORMAL HIGH (ref 70–99)
Glucose-Capillary: 269 mg/dL — ABNORMAL HIGH (ref 70–99)

## 2020-10-23 LAB — BASIC METABOLIC PANEL
Anion gap: 8 (ref 5–15)
BUN: 27 mg/dL — ABNORMAL HIGH (ref 8–23)
CO2: 28 mmol/L (ref 22–32)
Calcium: 7.3 mg/dL — ABNORMAL LOW (ref 8.9–10.3)
Chloride: 104 mmol/L (ref 98–111)
Creatinine, Ser: 2.07 mg/dL — ABNORMAL HIGH (ref 0.44–1.00)
GFR, Estimated: 26 mL/min — ABNORMAL LOW (ref >60)
Glucose, Bld: 198 mg/dL — ABNORMAL HIGH (ref 70–99)
Potassium: 3.3 mmol/L — ABNORMAL LOW (ref 3.5–5.1)
Sodium: 140 mmol/L (ref 135–145)

## 2020-10-23 LAB — APTT
aPTT: >200 seconds (ref 24–36)
aPTT: 121 seconds — ABNORMAL HIGH (ref 24–36)
aPTT: 87 seconds — ABNORMAL HIGH (ref 24–36)

## 2020-10-23 LAB — PROCALCITONIN: Procalcitonin: 3.85 ng/mL

## 2020-10-23 LAB — CBC
HCT: 24.4 % — ABNORMAL LOW (ref 36.0–46.0)
Hemoglobin: 8.8 g/dL — ABNORMAL LOW (ref 12.0–15.0)
MCH: 26.3 pg (ref 26.0–34.0)
MCHC: 36.1 g/dL — ABNORMAL HIGH (ref 30.0–36.0)
MCV: 72.8 fL — ABNORMAL LOW (ref 80.0–100.0)
Platelets: 339 10*3/uL (ref 150–400)
RBC: 3.35 MIL/uL — ABNORMAL LOW (ref 3.87–5.11)
RDW: 19.2 % — ABNORMAL HIGH (ref 11.5–15.5)
WBC: 19.6 10*3/uL — ABNORMAL HIGH (ref 4.0–10.5)
nRBC: 1.1 % — ABNORMAL HIGH (ref 0.0–0.2)

## 2020-10-23 LAB — HEPARIN LEVEL (UNFRACTIONATED): Heparin Unfractionated: 0.69 IU/mL (ref 0.30–0.70)

## 2020-10-23 LAB — PHOSPHORUS: Phosphorus: 2.1 mg/dL — ABNORMAL LOW (ref 2.5–4.6)

## 2020-10-23 LAB — MAGNESIUM: Magnesium: 2 mg/dL (ref 1.7–2.4)

## 2020-10-23 LAB — LACTIC ACID, PLASMA: Lactic Acid, Venous: 1.8 mmol/L (ref 0.5–1.9)

## 2020-10-23 LAB — MRSA NEXT GEN BY PCR, NASAL: MRSA by PCR Next Gen: NOT DETECTED

## 2020-10-23 MED ORDER — IPRATROPIUM-ALBUTEROL 0.5-2.5 (3) MG/3ML IN SOLN
3.0000 mL | RESPIRATORY_TRACT | Status: DC
  Administered 2020-10-23 – 2020-10-26 (×16): 3 mL via RESPIRATORY_TRACT
  Filled 2020-10-23 (×17): qty 3

## 2020-10-23 MED ORDER — HEPARIN (PORCINE) 25000 UT/250ML-% IV SOLN
800.0000 [IU]/h | INTRAVENOUS | Status: DC
  Administered 2020-10-23: 600 [IU]/h via INTRAVENOUS
  Filled 2020-10-23: qty 250

## 2020-10-23 MED ORDER — POTASSIUM CHLORIDE 10 MEQ/50ML IV SOLN
10.0000 meq | INTRAVENOUS | Status: AC
  Administered 2020-10-23 (×2): 10 meq via INTRAVENOUS
  Filled 2020-10-23 (×2): qty 50

## 2020-10-23 MED ORDER — HYDROMORPHONE HCL 1 MG/ML IJ SOLN
0.5000 mg | INTRAMUSCULAR | Status: DC | PRN
  Administered 2020-10-23 – 2020-10-26 (×5): 0.5 mg via INTRAVENOUS
  Filled 2020-10-23 (×5): qty 1

## 2020-10-23 MED ORDER — VANCOMYCIN HCL 1500 MG/300ML IV SOLN
1500.0000 mg | INTRAVENOUS | Status: DC
  Administered 2020-10-23: 1500 mg via INTRAVENOUS
  Filled 2020-10-23: qty 300

## 2020-10-23 NOTE — Progress Notes (Signed)
Progress Note  Patient Name: Melinda [name] Date of Encounter: 10/23/2020  [facility] Cardiologist: Ida Rogue, MD   Subjective   Intubated, arousable.  No further episodes of arrhythmia.  Inpatient Medications    Scheduled Meds:  acetaminophen  650 mg Per Tube Q4H   Or   acetaminophen (TYLENOL) oral liquid 160 mg/5 mL  650 mg Per Tube Q4H   Or   acetaminophen  650 mg Rectal Q4H   aspirin  81 mg Per Tube Daily   atorvastatin  80 mg Per Tube QHS   chlorhexidine  60 mL Topical Once   chlorhexidine gluconate (MEDLINE KIT)  15 mL Mouth Rinse BID   Chlorhexidine Gluconate Cloth  6 each Topical Daily   cholecalciferol  2,000 Units Per Tube Daily   clopidogrel  75 mg Per Tube Daily   docusate  100 mg Per Tube BID   DULoxetine  60 mg Oral Daily   feeding supplement (PIVOT 1.5 CAL)  1,000 mL Per Tube Q24H   feeding supplement (PROSource TF)  45 mL Per Tube QID   free water  30 mL Per Tube Q4H   insulin aspart  0-15 Units Subcutaneous Q4H   insulin glargine  6 Units Subcutaneous QHS   ipratropium-albuterol  3 mL Nebulization Q4H   mouth rinse  15 mL Mouth Rinse 10 times per day   multivitamin with minerals  1 tablet Per Tube Daily   nutrition supplement (JUVEN)  1 packet Per Tube BID BM   oxybutynin  5 mg Per Tube BID   pantoprazole (PROTONIX) IV  40 mg Intravenous QHS   polyethylene glycol  17 g Per Tube Daily   povidone-iodine  2 application Topical Once   pregabalin  100 mg Per Tube TID   Continuous Infusions:  sodium chloride Stopped (10/24/2020 1909)   amiodarone 30 mg/hr (10/23/20 1230)   ampicillin-sulbactam (UNASYN) IV 3 g (10/23/20 1401)   epinephrine Stopped (10/28/2020 0911)   heparin 800 Units/hr (10/23/20 0634)   norepinephrine (LEVOPHED) Adult infusion Stopped (10/23/20 0805)   sodium chloride     vancomycin     PRN Meds: acetaminophen **OR** acetaminophen, fentaNYL (SUBLIMAZE) injection, fentaNYL (SUBLIMAZE) injection, ondansetron (ZOFRAN) IV,  ondansetron **OR** ondansetron (ZOFRAN) IV, povidone-Iodine, tiZANidine   Vital Signs    Vitals:   10/23/20 1200 10/23/20 1300 10/23/20 1400 10/23/20 1420  BP: (!) 144/66 (!) 143/70 (!) 159/79   Pulse: 80 80 91   Resp: (!) 22 (!) 22 13   Temp: (!) 97.34 F (36.3 C) (!) 97.34 F (36.3 C) (!) 97.52 F (36.4 C)   TempSrc:      SpO2: 93% 95% 96% 93%  Weight:      Height:        Intake/Output Summary (Last 24 hours) at 10/23/2020 1504 Last data filed at 10/23/2020 1401 Gross per 24 hour  Intake 1991.6 ml  Output 785 ml  Net 1206.6 ml   Last 3 Weights 10/02/2020 10/03/2020 10/07/2020  Weight (lbs) 244 lb 244 lb 244 lb  Weight (kg) 110.678 kg 110.678 kg 110.678 kg  Some encounter information is confidential and restricted. Go to Review Flowsheets activity to see all data.      Telemetry    Sinus rhythm- Personally Reviewed  ECG    No new ECG obtained- Personally Reviewed  Physical Exam   GEN: Intubated, arousable Neck: No JVD Cardiac: RRR, distant heart sounds Respiratory: Vented breath sounds GI: Soft, nontender, non-distended  MS: No edema; No deformity.  Neuro:  Nonfocal  Psych: Unable to assess  Labs    High Sensitivity Troponin:   Recent Labs  Lab 10/24/2020 2030 10/13/2020 0000 10/12/2020 1018 10/02/2020 1303 10/07/2020 1628  TROPONINIHS 17 27* 48* 53* 46*      Chemistry Recent Labs  Lab 09/30/2020 1419 10/20/20 0930 10/10/2020 2030 10/20/2020 0620 10/23/20 0330  NA 138   < > 137 140 140  K 2.5*   < > 4.7 3.6 3.3*  CL 98   < > 103 105 104  CO2 31   < > 17* 23 28  GLUCOSE 128*   < > 218* 210* 198*  BUN 11   < > 18 22 27*  CREATININE 1.13*   < > 1.51* 1.64* 2.07*  CALCIUM 7.7*   < > 7.8* 7.6* 7.3*  PROT 6.1*  --   --   --   --   ALBUMIN 2.1*  --   --   --   --   AST 19  --   --   --   --   ALT 17  --   --   --   --   ALKPHOS 140*  --   --   --   --   BILITOT 1.5*  --   --   --   --   GFRNONAA 53*   < > 38* 34* 26*  ANIONGAP 9   < > 17* 12 8   < > = values  in this interval not displayed.     Hematology Recent Labs  Lab 10/10/2020 0000 09/29/2020 0620 10/23/20 0330  WBC 24.9* 29.0* 19.6*  RBC 3.45* 3.40* 3.35*  HGB 9.1* 8.8* 8.8*  HCT 26.6* 25.0* 24.4*  MCV 77.1* 73.5* 72.8*  MCH 26.4 25.9* 26.3  MCHC 34.2 35.2 36.1*  RDW 19.8* 19.2* 19.2*  PLT 345 345 339    BNPNo results for input(s): BNP, PROBNP in the last 168 hours.   DDimer No results for input(s): DDIMER in the last 168 hours.   Radiology    DG Abd 1 View  Result Date: 09/29/2020 CLINICAL DATA:  Assess orogastric tube EXAM: ABDOMEN - 1 VIEW COMPARISON:  Earlier today FINDINGS: Enteric tube with tip at the stomach. The side port is likely just below the GE junction. Extensive artifact from support hardware. Minimal coverage of the abdomen showing no gas dilated bowel. Lower chest opacity known from dedicated radiography. IMPRESSION: The enteric tube tip is at the upper stomach. Electronically Signed   By: Monte Fantasia M.D.   On: 10/20/2020 11:52   DG Abd 1 View  Result Date: 10/17/2020 CLINICAL DATA:  OG tube placement EXAM: ABDOMEN - 1 VIEW COMPARISON:  Chest x-ray 10/02/2020 FINDINGS: Limited radiograph of the lower chest and upper abdomen was obtained for the purposes of enteric tube localization. There are numerous overlying monitoring leads. A distinct orogastric tube is not definitively identified. Visualized bowel gas pattern is nonobstructive. IMPRESSION: Numerous overlying monitoring leads a distinct orogastric tube is not definitively identified. A repeat film with inclusion of more of the chest would be helpful to further assess. Electronically Signed   By: Davina Poke D.O.   On: 10/05/2020 10:58   DG Chest Port 1 View  Result Date: 10/23/2020 CLINICAL DATA:  Acute respiratory failure with hypoxia EXAM: PORTABLE CHEST 1 VIEW COMPARISON:  10/10/2020 FINDINGS: The endotracheal tube tip is 4.3 cm from the carina. Enteric tube is in place with tip below the diaphragm  not included  in the field of view. Right central line tip overlies the SVC. Stable cardiomegaly post CABG. Worsening bibasilar aeration with otherwise stable diffuse bilateral lung opacities. Possible small pleural effusions. No pneumothorax. Overlying monitoring devices partially obscure detailed assessment IMPRESSION: 1. Worsening bibasilar aeration with otherwise stable diffuse bilateral lung opacities, pulmonary edema versus multifocal pneumonia. Question small pleural effusions. 2. Stable cardiomegaly post CABG. 3. Stable support apparatus. Electronically Signed   By: Keith Rake M.D.   On: 10/23/2020 01:01   DG Chest Port 1 View  Result Date: 10/07/2020 CLINICAL DATA:  Central line, endotracheal and orogastric tube placement. EXAM: PORTABLE CHEST 1 VIEW COMPARISON:  Radiograph yesterday. FINDINGS: Tip of the endotracheal tube is approximately 2.6 cm from the carina, carina is difficult to accurately define. No enteric tube is seen. Right internal jugular central venous catheter tip projects over the SVC. No pneumothorax. Prior median sternotomy. Stable cardiomegaly. Diffuse interstitial opacities with slight worsening at the lung bases from earlier today. There may be small pleural effusions. IMPRESSION: 1. Tip of the endotracheal tube approximately 2.6 cm from the carina. Right internal jugular central venous catheter tip projects over the SVC. No pneumothorax. 2. Diffuse interstitial opacities with slight worsening at the lung bases from earlier today, suspicious for pulmonary edema. Probable pleural effusions. Stable cardiomegaly post CABG. Electronically Signed   By: Keith Rake M.D.   On: 10/07/2020 21:23   EEG adult  Result Date: 10/19/2020 Derek Jack, MD     10/08/2020  3:34 PM Routine EEG Report Sherle Mello is a 65 y.o. female with a history of cardiac arrest who is undergoing an EEG to evaluate for seizures. Report: This EEG was acquired with electrodes placed according  to the International 10-20 electrode system (including Fp1, Fp2, F3, F4, C3, C4, P3, P4, O1, O2, T3, T4, T5, T6, A1, A2, Fz, Cz, Pz). The following electrodes were missing or displaced: none. The background was continuous and composed primarily of theta frequencies 5-7 Hz. This activity is reactive to stimulation. Some sleep architecture (K complexes) was noted. There was no focal slowing. There were no interictal epileptiform discharges. There were no electrographic seizures identified. There was no abnormal response to photic stimulation. Hyperventilation was not performed 2/2 patient being on a ventilator. Impression and clinical correlation: This EEG was obtained while awake and asleep and was abnormal due to moderate diffuse slowing. No epileptiform abnormalities were noted during this study. Su Monks, MD [facility] [phone] If 7pm- 7am, please page neurology on call as listed in [place].   ECHOCARDIOGRAM COMPLETE  Result Date: 09/29/2020    ECHOCARDIOGRAM REPORT   Patient Name:   [facility] Date of Exam: 10/24/2020 Medical Rec #:  [id number]          Height:       68.0 in Accession #:    [id number]         Weight:       244.0 lb Date of Birth:  Apr 05, 1956          BSA:          2.224 m Patient Age:    31 years           BP:           134/61 mmHg Patient Gender: F                  HR:           80 bpm. Exam Location:  [facility]  Procedure: 2D Echo, Cardiac Doppler, Color Doppler and Strain Analysis Indications:     Cardiac Arrest I46.9  History:         Patient has prior history of Echocardiogram examinations, most                  recent 08/12/2020. Previous Myocardial Infarction,                  Arrythmias:Atrial Fibrillation and Atrial Flutter; Risk                  Factors:Hypertension and Diabetes.  Sonographer:     Sherrie Sport RDCS (AE) Referring Phys:  [id number] [facility] Pickle Diagnosing Phys: Nelva Bush MD  Sonographer Comments: Global longitudinal strain was attempted.  IMPRESSIONS  1. Left ventricular ejection fraction, by estimation, is 45%. The left ventricle has mild to moderately decreased function. The left ventricle demonstrates global hypokinesis. There is moderate left ventricular hypertrophy. Left ventricular diastolic parameters are consistent with Grade II diastolic dysfunction (pseudonormalization). Elevated left atrial pressure. The average left ventricular global longitudinal strain is -12.2 %. The global longitudinal strain is abnormal.  2. Pulmonary artery pressure is at least mildly to moderately elevated (RVSP 35-40 mmHg plus central venous pressure). Right ventricular systolic function is moderately reduced. The right ventricular size is mildly enlarged.  3. Left atrial size was moderately dilated.  4. Right atrial size was mildly dilated.  5. The mitral valve is normal in structure. Mild mitral valve regurgitation. No evidence of mitral stenosis.  6. Tricuspid valve regurgitation is mild to moderate.  7. The aortic valve has an indeterminant number of cusps. There is mild calcification of the aortic valve. There is mild thickening of the aortic valve. Aortic valve regurgitation is not visualized. Mild aortic valve sclerosis is present, with no evidence of aortic valve stenosis. FINDINGS  Left Ventricle: Left ventricular ejection fraction, by estimation, is 45%. The left ventricle has mild to moderately decreased function. The left ventricle demonstrates global hypokinesis. The average left ventricular global longitudinal strain is -12.2  %. The global longitudinal strain is abnormal. The left ventricular internal cavity size was normal in size. There is moderate left ventricular hypertrophy. Left ventricular diastolic parameters are consistent with Grade II diastolic dysfunction (pseudonormalization). Elevated left atrial pressure. Right Ventricle: Pulmonary artery pressure is at least mildly to moderately elevated (RVSP 35-40 mmHg plus central venous  pressure). The right ventricular size is mildly enlarged. No increase in right ventricular wall thickness. Right ventricular systolic function is moderately reduced. Left Atrium: Left atrial size was moderately dilated. Right Atrium: Right atrial size was mildly dilated. Pericardium: There is no evidence of pericardial effusion. Mitral Valve: The mitral valve is normal in structure. Mild mitral valve regurgitation. No evidence of mitral valve stenosis. Tricuspid Valve: The tricuspid valve is normal in structure. Tricuspid valve regurgitation is mild to moderate. Aortic Valve: The aortic valve has an indeterminant number of cusps. There is mild calcification of the aortic valve. There is mild thickening of the aortic valve. Aortic valve regurgitation is not visualized. Mild aortic valve sclerosis is present, with  no evidence of aortic valve stenosis. Aortic valve mean gradient measures 3.0 mmHg. Aortic valve peak gradient measures 5.9 mmHg. Aortic valve area, by VTI measures 2.69 cm. Pulmonic Valve: The pulmonic valve was grossly normal. Pulmonic valve regurgitation is not visualized. No evidence of pulmonic stenosis. Aorta: The aortic root is normal in size and structure. Pulmonary Artery: The pulmonary artery is not well  seen. Venous: IVC assessment for right atrial pressure unable to be performed due to mechanical ventilation. IAS/Shunts: The interatrial septum was not well visualized.  LEFT VENTRICLE PLAX 2D LVIDd:         5.11 cm  Diastology LVIDs:         3.65 cm  LV e' medial:    5.55 cm/s LV PW:         1.41 cm  LV E/e' medial:  21.4 LV IVS:        0.98 cm  LV e' lateral:   7.72 cm/s LVOT diam:     2.10 cm  LV E/e' lateral: 15.4 LV SV:         65 LV SV Index:   29       2D Longitudinal Strain LVOT Area:     3.46 cm 2D Strain GLS Avg:     -12.2 %                          3D Volume EF:                         3D EF:        44 %                         LV EDV:       113 ml                         LV ESV:        63 ml                         LV SV:        50 ml RIGHT VENTRICLE RV Basal diam:  4.54 cm RV S prime:     7.29 cm/s TAPSE (M-mode): 1.0 cm LEFT ATRIUM              Index       RIGHT ATRIUM           Index LA diam:        4.90 cm  2.20 cm/m  RA Area:     19.10 cm LA Vol (A2C):   122.0 ml 54.86 ml/m RA Volume:   52.40 ml  23.56 ml/m LA Vol (A4C):   68.2 ml  30.67 ml/m LA Biplane Vol: 92.6 ml  41.64 ml/m  AORTIC VALVE                   PULMONIC VALVE AV Area (Vmax):    2.45 cm    PV Vmax:        0.63 m/s AV Area (Vmean):   2.36 cm    PV Peak grad:   1.6 mmHg AV Area (VTI):     2.69 cm    RVOT Peak grad: 3 mmHg AV Vmax:           121.00 cm/s AV Vmean:          80.900 cm/s AV VTI:            0.243 m AV Peak Grad:      5.9 mmHg AV Mean Grad:      3.0 mmHg LVOT Vmax:         85.50 cm/s LVOT Vmean:  55.200 cm/s LVOT VTI:          0.189 m LVOT/AV VTI ratio: 0.78  AORTA Ao Root diam: 3.07 cm MITRAL VALVE                TRICUSPID VALVE MV Area (PHT): 3.60 cm     TR Peak grad:   36.7 mmHg MV Decel Time: 211 msec     TR Vmax:        303.00 cm/s MV E velocity: 119.00 cm/s MV A velocity: 71.60 cm/s   SHUNTS MV E/A ratio:  1.66         Systemic VTI:  0.19 m                             Systemic Diam: 2.10 cm Nelva Bush MD Electronically signed by Nelva Bush MD Signature Date/Time: 10/12/2020/12:55:02 PM    Final     Cardiac Studies   Echo 10/18/2020 1. Left ventricular ejection fraction, by estimation, is 45%. The left  ventricle has mild to moderately decreased function. The left ventricle  demonstrates global hypokinesis. There is moderate left ventricular  hypertrophy. Left ventricular diastolic  parameters are consistent with Grade II diastolic dysfunction  (pseudonormalization). Elevated left atrial pressure. The average left  ventricular global longitudinal strain is -12.2 %. The global longitudinal  strain is abnormal.   2. Pulmonary artery pressure is at least mildly to moderately  elevated  (RVSP 35-40 mmHg plus central venous pressure). Right ventricular systolic  function is moderately reduced. The right ventricular size is mildly  enlarged.   3. Left atrial size was moderately dilated.   4. Right atrial size was mildly dilated.   5. The mitral valve is normal in structure. Mild mitral valve  regurgitation. No evidence of mitral stenosis.   6. Tricuspid valve regurgitation is mild to moderate.   7. The aortic valve has an indeterminant number of cusps. There is mild  calcification of the aortic valve. There is mild thickening of the aortic  valve. Aortic valve regurgitation is not visualized. Mild aortic valve  sclerosis is present, with no  evidence of aortic valve stenosis.   Patient Profile     65 y.o. female history of CAD/CABG x4, atrial flutter/DC cardioversion, paroxysmal A. fib, PAD, CKD 3, diabetes presenting with right heel ulcer s/p thrombectomy and angioplasty of right popliteal artery.  Hospital course complicated by abnormal breath sounds and wide-complex tachycardia.  Intubated for airway protection.  Paroxysmal A. fib noted.  Assessment & Plan    1.  Wide-complex tachycardia -No further episodes of arrhythmia on amiodarone drip -Continue IV amiodarone -Plan for ischemic work-up/left heart cath when respiratory function and renal function improves.  2.  History of CAD -Aspirin, Plavix, Lipitor  3.  Paroxysmal A. fib, -Maintaining sinus on amiodarone -On heparin drip  4.  Renal dysfunction -Avoid nephrotoxic's  5.  PAD s/p mechanical thrombectomy, angioplasty of right popliteal artery -Aspirin, Plavix, Lipitor -Management as per vascular surgery  6.  Respiratory dysfunction -Intubated, vent settings management as per critical care team   Critical care time was exclusive of separate billable procedures and treating other patients.  Critial care time was spent personally by me (independant of midlevel providers) on the following  activities: development of treatment plan,  evaluation of patients response to treatment, examining patient, reviewing treatments/ interventions, lab studies, radiographic studies, pulse ox,.    The patient is critically ill with multiple organ  systems failure and requires high complexity decision making for assessment and support, frequent evaluation and titration of therapies, application of advanced monitoring technologies and extensive interpretation of databases.   Critical care was necessary to treat or prevent immintent or life-threatening deterioration.       Total CCT spent directly with the patient today is 36mns   Signed, BKate Sable MD  10/23/2020, 3:04 PM

## 2020-10-23 NOTE — Consult Note (Signed)
[place] for Heparin Drip Indication: ACS/NSTEMI  No Known Allergies  Patient Measurements: Height: 5\' 8"  (172.7 cm) Weight: 110.7 kg (244 lb) IBW/kg (Calculated) : 63.9 Heparin dosing wt 89.1 kg  Vital Signs: Temp: 97.16 F (36.2 C) (06/25 2200) Temp Source: Esophageal (06/25 2000) BP: 126/62 (06/25 2200) Pulse Rate: 81 (06/25 2200)  Labs: Recent Labs    10/18/2020 2030 10/04/2020 0000 10/18/2020 0620 10/13/2020 0645 10/05/2020 1018 10/18/2020 1303 10/20/2020 1505 10/09/2020 1628 10/22/2020 1720 10/23/20 0330 10/23/20 1425 10/23/20 2240  HGB 9.0* 9.1* 8.8*  --   --   --   --   --   --  8.8*  --   --   HCT 27.5* 26.6* 25.0*  --   --   --   --   --   --  24.4*  --   --   PLT 337 345 345  --   --   --   --   --   --  339  --   --   APTT  --   --   --  158*  --   --  >200*  --    < > >200* 121* 87*  LABPROT 22.0*  --   --   --   --   --   --   --   --   --   --   --   INR 1.9*  --   --   --   --   --   --   --   --   --   --   --   HEPARINUNFRC  --   --   --  0.71*  --   --  0.82*  --   --  0.69  --   --   CREATININE 1.51*  --  1.64*  --   --   --   --   --   --  2.07*  --   --   TROPONINIHS 17 27*  --   --  48* 53*  --  46*  --   --   --   --    < > = values in this interval not displayed.     Estimated Creatinine Clearance: 34.4 mL/min (A) (by C-G formula based on SCr of 2.07 mg/dL (H)).   Medications: NKDA PTA apixiban dose 5mg  bid - last dose 6/21 @ 1000 Also on plavix 75mg  QD & ASA 81mg  QD Heparin dosing wt 89.1 kg  Assessment: 65 yo female with history of a fib (on eliquis) and PAD - pharmacy has been consulted to initiate and monitor a heparin drip pending surgical plan. 6/24:  Heparin gtt stopped on 6/23 @ 0755 in preparation for vascular surgery. APTT @ 1259 = 49, subtherapeutic but reflects ~ 5 hrs since heparin was d/c'd. Pt had elevated troponins on 6/24 @ 0000 so heparin was restarted for ACS/NSTEMI.    Date Time aPTT/HL Rate/Comment 6/21 1000 ---  Last dose of eliquis PTA; 1100 un/hr 6/22 0924 45s / >1.10 1100 un/hr > 1400 un/hr 6/23 0755 49s / --- Drip was off for 5hrs for Vasc Sx 6/24 0000 ---  Resumed for ACS/NSTEMI - trop elevation 6/24  0645 158s / 0.71 1700 > 1500 un/hr (hold 1hr; then lower rate) 6/24 1505 >200s/ 0.82 Both levels higher than expected given rate reduction --> recollect   6/24 1720 > 200s  Hold, 1 hour , then lower  rate  1500 > 1150 units/hr 6/25     0330   > 200s / 0.69   Hold 1 hr then start lower rate of 800 units/hr 6/25 1425 121  Rate 800 > 600 units/hr 6/25     2240     87                  600 units/hr , therapeutic X 1    Goal of Therapy:  Heparin level 0.3-0.7 units/ml; aPTT 66-102s Monitor platelets by anticoagulation protocol: Yes   Plan:  6/25:  aPTT @ 2240 = 87, therapeutic X 1  Will continue pt on current rate and recheck aPTT and HL on 6/26 with AM labs.   Orene Desanctis, PharmD Clinical Pharmacist 10/23/2020 11:23 PM

## 2020-10-23 NOTE — Consult Note (Signed)
Pharmacy Antibiotic Note  Graylee Arutyunyan is a 65 y.o. female admitted on 09/29/2020 with non-healing right heel wound.  Pharmacy has been consulted for vancomycin dosing for wound infection.    Plan: Creatinine continues to increase.  Adjust vancomycin to 1500 mg Q48H Goal AUC 400-550 Predicted AUC: 511 Scr used: 2.07   Height: 5\' 8"  (172.7 cm) Weight: 110.7 kg (244 lb) IBW/kg (Calculated) : 63.9  Temp (24hrs), Avg:97.2 F (36.2 C), Min:96.08 F (35.6 C), Max:98.2 F (36.8 C)  Recent Labs  Lab 10/20/20 0930 10/07/2020 0519 10/18/2020 2030 10/16/2020 0000 10/09/2020 0050 10/24/2020 0620 10/16/2020 0645 10/03/2020 1018 10/14/2020 1303 10/18/2020 1505 10/23/20 0330  WBC 15.0* 13.6* 26.1* 24.9*  --  29.0*  --   --   --   --  19.6*  CREATININE 1.10* 1.16* 1.51*  --   --  1.64*  --   --   --   --  2.07*  LATICACIDVEN  --   --  >11.0*  --    < >  --  5.1* 6.0* 2.4* 2.0* 1.8   < > = values in this interval not displayed.     Estimated Creatinine Clearance: 34.4 mL/min (A) (by C-G formula based on SCr of 2.07 mg/dL (H)).    No Known Allergies  Antimicrobials this admission: 6/21 cefepime + Flagyl --> Unasyn 6/23>> 6/21 vancomycin >>    Microbiology results: 6/21 BCx: NG  Thank you for allowing pharmacy to be a part of this patient's care.  Tawnya Crook, PharmD Clinical Pharmacist   10/23/2020 11:39 AM

## 2020-10-23 NOTE — Consult Note (Signed)
ANTICOAGULATION CONSULT NOTE - Follow Up Consult  Pharmacy Consult for Heparin Drip Indication: ACS/NSTEMI  No Known Allergies  Patient Measurements: Height: 5\' 8"  (172.7 cm) Weight: 110.7 kg (244 lb) IBW/kg (Calculated) : 63.9 Heparin dosing wt 89.1 kg  Vital Signs: Temp: 97.34 F (36.3 C) (06/25 0400) Temp Source: Esophageal (06/25 0400) BP: 138/63 (06/25 0400) Pulse Rate: 72 (06/25 0400)  Labs: Recent Labs    10/20/20 0930 10/20/20 1559 10/09/2020 2030 10/09/2020 0000 10/25/2020 0620 10/27/2020 0645 10/28/2020 1018 10/04/2020 1303 10/12/2020 1505 10/18/2020 1628 10/11/2020 1720 10/23/20 0330  HGB 9.7*   < > 9.0* 9.1* 8.8*  --   --   --   --   --   --  8.8*  HCT 29.0*   < > 27.5* 26.6* 25.0*  --   --   --   --   --   --  24.4*  PLT 342   < > 337 345 345  --   --   --   --   --   --  339  APTT  --    < >  --   --   --  158*  --   --  >200*  --  >200* >200*  LABPROT 21.6*  --  22.0*  --   --   --   --   --   --   --   --   --   INR 1.9*  --  1.9*  --   --   --   --   --   --   --   --   --   HEPARINUNFRC  --   --   --   --   --  0.71*  --   --  0.82*  --   --  0.69  CREATININE 1.10*   < > 1.51*  --  1.64*  --   --   --   --   --   --  2.07*  TROPONINIHS  --    < > 17 27*  --   --  48* 53*  --  46*  --   --    < > = values in this interval not displayed.     Estimated Creatinine Clearance: 34.4 mL/min (A) (by C-G formula based on SCr of 2.07 mg/dL (H)).   Medications: NKDA PTA apixiban dose 5mg  bid - last dose 6/21 @ 1000 Also on plavix 75mg  QD & ASA 81mg  QD Heparin dosing wt 89.1 kg  Assessment: 65 yo female with history of a fib (on eliquis) and PAD - pharmacy has been consulted to initiate and monitor a heparin drip pending surgical plan. 6/24:  Heparin gtt stopped on 6/23 @ 0755 in preparation for vascular surgery. APTT @ 1259 = 49, subtherapeutic but reflects ~ 5 hrs since heparin was d/c'd. Pt had elevated troponins on 6/24 @ 0000 so heparin was restarted for ACS/NSTEMI.    Date Time aPTT/HL Rate/Comment 6/21 1000 ---  Last dose of eliquis PTA; 1100 un/hr 6/22 0924 45s / >1.10 1100 un/hr > 1400 un/hr 6/23 0755 49s / --- Drip was off for 5hrs for Vasc Sx 6/24 0000 ---  Resumed for ACS/NSTEMI - trop elevation 6/24  0645 158s / 0.71 1700 > 1500 un/hr (hold 1hr; then lower rate) 6/24 1505 >200s/ 0.82 Both levels higher than expected given rate reduction --> recollect   6/24 1720 > 200s  Hold, 1 hour , then lower rate  1500 > 1150 units/hr 6/25     0330   > 200s / 0.69   Hold 1 hr then start lower rate of 800 units/hr  Labs: aPTT - 48s INR - 2.0>1.9 Hgb - 10>9>8.8 Plts - [phone]   Goal of Therapy:  Heparin level 0.3-0.7 units/ml; aPTT 66-102s Monitor platelets by anticoagulation protocol: Yes   Plan:  6/25 @ 0330:   aPTT = > 200 s,   HL = 0.69 HL and aPTT still do not correlate.  APTT is still elevated Will hold heparin drip for 1  hr and restart @ 800 units/hr. Will recheck aPTT 6 hrs after restart. Will recheck HL on 6/26 with AM labs.   Orene Desanctis, PharmD Clinical Pharmacist 10/23/2020 4:52 AM

## 2020-10-23 NOTE — Progress Notes (Addendum)
NAME:  Melinda [name], MRN:  [id number], DOB:  11/01/1955, LOS: 3 ADMISSION DATE:  10/01/2020, CONSULTATION DATE:  10/04/2020 REFERRING MD:  Judd Gaudier, MD CHIEF COMPLAINT:  Respiratory arrest, circulatory shock   History of Present Illness:  65 year old female with CAD s/p CABG, chronic systolic heart failure  EF 30-35%, PVD s/p stents in left SFA, atrial fibrillation, stage III CKD admitted to hospitalist for sepsis secondary to OM of right heel  Underwent right lower extremity angiography on 6/23. Post-procedure no immediate complications however this evening she complained of chest/abdominal pain. She then went into respiratory arrest and code blue called and she was intubated and transferred to the ICU. On arrival to the ICU she was noted to be in ventricular tachycardia without a pulse. CPR was initiated and ROSC was achieved after 5 minutes. She received epi, sodium bicarb, amiodarone. Levophed was started for hypotension. She then developed bradycardia and received atropine 1 mg with some response. She continued to have intermittent episodes of bradycardia so epi gtt was added.  Family at bedside. Updated on her critical condition.  Pertinent  Medical History  CAD s/p CABG, chronic systolic heart failure EF 30-35%, PVD s/p stents in left SFA, atrial fibrillation, stage III CKD   Significant Hospital Events: Including procedures, antibiotic start and stop dates in addition to other pertinent events   6/21 6/23: Suffered respiratory arrest with subsequent V. Tach arrest  6/24: Awake and able to follow commands, weaning down vasopressors, Cardiology consulted  Interim History / Subjective:  -Last night suffered respiratory arrest and then with V. Tach arrest upon arrival to ICU -This morning is awake and able to follow simple commands -Weaning pressors, Epi down to 0.5 -Cardiology consulted, ECHO pending  Objective   Blood pressure (!) 144/66, pulse 80, temperature (!) 97.34 F  (36.3 C), resp. rate (!) 22, height 5' 8"  (1.727 m), weight 110.7 kg, SpO2 93 %.    Vent Mode: PRVC FiO2 (%):  [30 %-40 %] 40 % Set Rate:  [16 bmp-20 bmp] 16 bmp Vt Set:  [500 mL] 500 mL PEEP:  [5 cmH20] 5 cmH20 Pressure Support:  [10 cmH20] 10 cmH20 Plateau Pressure:  [22 cmH20-24 cmH20] 22 cmH20   Intake/Output Summary (Last 24 hours) at 10/23/2020 1303 Last data filed at 10/23/2020 0805 Gross per 24 hour  Intake 2391.6 ml  Output 1085 ml  Net 1306.6 ml    Filed Weights   10/01/2020 1416 10/14/2020 0801  Weight: 110.7 kg 110.7 kg   Physical Exam: General: Critically ill-appearing, intubated, no acute distress HENT: [place], AT, ETT in place Eyes: EOMI, no scleral icterus Respiratory: Mechanical  breath sounds bilaterally.  No crackles, wheezing or rales noted, overbreathing the vent, even Cardiovascular: RRR, -M/R/G, no JVD GI: BS+, soft, nontender, nondistended, no guarding or rebound tenderness Extremities:-Edema,-tenderness, s/p multiple left toe amputation, right heel ulcer Neuro: Awake, moves all extremities to commands, pupils PERRLA GU: Foley in place  Labs/imaging that I have personally reviewed  (right click and "Reselect all SmartList Selections" daily)  Labs 10/16/2020: glucose 210, BUN 22, Cr. 1.64, WBC 29, lactic acid 5.1, Hgb 8.8 ABG: 7.53 /29/114/24.2    CXR: ETT in place. R IJ CVC over SVC. No pneumothorax. Bilaterally interstitial opacities with worsening bases. Cardiomegaly (concerning for pulmonary edema)  Resolved Hospital Problem list     Assessment & Plan:   Cardiogenic shock +/- Septic Shock VT arrest Hx CABG, chronic systolic heart failure Atrial fib/flutter  ECHO 10/12/2020 1. Left ventricular ejection fraction, by estimation, is 45%. The left  ventricle has mild to moderately decreased function. The left ventricle  demonstrates global hypokinesis. There is moderate left ventricular  hypertrophy. Left ventricular diastolic  parameters are  consistent with Grade II diastolic dysfunction  (pseudonormalization). Elevated left atrial pressure. The average left  ventricular global longitudinal strain is -12.2 %. The global longitudinal  strain is abnormal.   2. Pulmonary artery pressure is at least mildly to moderately elevated  (RVSP 35-40 mmHg plus central venous pressure). Right ventricular systolic  function is moderately reduced. The right ventricular size is mildly  enlarged.   3. Left atrial size was moderately dilated.   4. Right atrial size was mildly dilated.   5. The mitral valve is normal in structure. Mild mitral valve  regurgitation. No evidence of mitral stenosis.   6. Tricuspid valve regurgitation is mild to moderate.   7. The aortic valve has an indeterminant number of cusps. There is mild  calcification of the aortic valve. There is mild thickening of the aortic  valve. Aortic valve regurgitation is not visualized. Mild aortic valve  sclerosis is present, with no  evidence of aortic valve stenosis.   -Continuous cardiac monitoring -Maintain MAP >65, resolved pressors, but require brief return to levophed, off again -Lactic Acid -peak 6.0, now resolved at 1.8 -Continue Heparin and Amiodarone drips -Goal serum Potassium of 4 and Magnesium 2 -Continue ASA, Plavix, statin -Trend HS Troponin, peaked 53  -Normothermia protocol -Cardiology consulted, appreciate input ~ likely will need CardiacCATH prior to discharge -Diuresis as BP and renal function permits ~ currently holding diuresis due to vasopressors  Acute hypoxemic respiratory failure Pulmonary edema    Component Value Date/Time   PHART 7.51 (H) 10/23/2020 0500   PCO2ART 34 10/23/2020 0500   PO2ART 62 (L) 10/23/2020 0500   HCO3 27.1 10/23/2020 0500   TCO2 22 06/09/2014 1822   ACIDBASEDEF 8.7 (H) 10/23/2020 2300   O2SAT 93.6 10/23/2020 0500   -CXR with increasing bibasilar infiltrates and left>right effusions -Full vent support, implement lung  protective strategies -Wean FiO2 & PEEP as tolerated to maintain O2 sats >92% -Follow intermittent CXR & ABG as needed -Spontaneous breathing trials when respiratory parameters met and mental status permits -Implement VAP bundle -PRN Bronchodilators -Diuresis limited by renal indices   Acute encephalopathy secondary to arrest Sedation needs in setting of mechanical ventilation -Maintain RASS goal of 0 to -1 -PRN fentanyl and versed pushes as needed -Avoid sedating meds as able -Daily wake up assessment -Normothermia protocol -Pt is currently awake and following simple commands, will hold off on CT Head and EEG at this time  Sepsis/septic shock secondary right foot OM -Monitor fever curve -Trend WBC's & Procalcitonin -Follow cultures -ID following, ABX as per ID ~ currently on Vancomycin & Unasyn -Vascular Surgery and Podiatry following, appreciate input -S/p Angioplasty, mechanical thrombectomy of RLE on 10/13/2020  Acute on chronic CKD III - cardiac arrest, recent contrast Lab Results  Component Value Date   CREATININE 2.07 (H) 10/23/2020   BUN 27 (H) 10/23/2020   NA 140 10/23/2020   K 3.3 (L) 10/23/2020   CL 104 10/23/2020   CO2 28 10/23/2020  -Monitor I&O's / urinary output -Follow BMP, trend renal indices prior to volume reduction -Ensure adequate renal perfusion -Avoid nephrotoxic agents as able -Replace electrolytes as indicated  DM2 -CBG's -SSI -Follow ICU Hypo/Hyperglycemia protocol   Best Practice (right click and "Reselect all SmartList Selections" daily)  Diet/type: NPO, start tube feeds Pain/Anxiety/Delirium protocol RASS goal: 0 to -1 VAP protocol (if indicated): Yes DVT prophylaxis: systemic heparin GI prophylaxis: PPI Glucose control:  SSI Central venous access:  Yes, and it is still needed Arterial line:  Yes, and it is still needed Foley:  Yes, and it is still needed Mobility:  bed rest  PT consulted: N/A Studies pending:  Echocardiogram Culture data pending:blood Last reviewed culture data:today Antibiotics:unasyn and vanc Antibiotic de-escalation: no,  continue current rx Stop date: to be determined  Code Status:  full code Last date of multidisciplinary goals of care discussion [6/24]  ccm prognosis: Life-threating Disposition: remains critically ill, will stay in intensive care  Updated pt's sister at bedside 10/23/2020   Labs   CBC: Recent Labs  Lab 10/18/2020 1419 10/20/20 0930 10/27/2020 0519 10/09/2020 2030 10/20/2020 0000 10/04/2020 0620 10/23/20 0330  WBC 14.7*   < > 13.6* 26.1* 24.9* 29.0* 19.6*  NEUTROABS 12.0*  --   --   --   --   --   --   HGB 10.0*   < > 9.5* 9.0* 9.1* 8.8* 8.8*  HCT 29.8*   < > 27.6* 27.5* 26.6* 25.0* 24.4*  MCV 77.2*   < > 74.8* 79.0* 77.1* 73.5* 72.8*  PLT 351   < > 340 337 345 345 339   < > = values in this interval not displayed.     Basic Metabolic Panel: Recent Labs  Lab 10/20/20 0930 10/23/2020 0519 10/02/2020 2030 10/18/2020 0000 10/02/2020 0620 10/23/20 0330  NA 139 143 137  --  140 140  K 3.2* 3.7 4.7  --  3.6 3.3*  CL 100 105 103  --  105 104  CO2 31 32 17*  --  23 28  GLUCOSE 158* 134* 218*  --  210* 198*  BUN 12 14 18   --  22 27*  CREATININE 1.10* 1.16* 1.51*  --  1.64* 2.07*  CALCIUM 7.9* 8.1* 7.8*  --  7.6* 7.3*  MG  --  2.0 2.4 2.0 1.7 2.0  PHOS  --   --   --   --   --  2.1*    GFR: Estimated Creatinine Clearance: 34.4 mL/min (A) (by C-G formula based on SCr of 2.07 mg/dL (H)). Recent Labs  Lab 10/20/20 0930 10/08/2020 0519 10/15/2020 2030 10/07/2020 0000 10/28/2020 0050 10/05/2020 0620 09/30/2020 0645 10/18/2020 1018 10/05/2020 1303 10/13/2020 1505 10/23/20 0330  PROCALCITON <0.10  --   --   --   --   --   --  2.80  --   --  3.85  WBC 15.0*   < > 26.1* 24.9*  --  29.0*  --   --   --   --  19.6*  LATICACIDVEN  --   --  >11.0*  --    < >  --    < > 6.0* 2.4* 2.0* 1.8   < > = values in this interval not displayed.     Liver Function Tests: Recent  Labs  Lab 10/23/2020 1419  AST 19  ALT 17  ALKPHOS 140*  BILITOT 1.5*  PROT 6.1*  ALBUMIN 2.1*    No results for input(s): LIPASE, AMYLASE in the last 168 hours. No results for input(s): AMMONIA in the last 168 hours.  ABG    Component Value Date/Time   PHART 7.51 (H) 10/23/2020 0500   PCO2ART 34 10/23/2020 0500   PO2ART 62 (L) 10/23/2020 0500   HCO3 27.1 10/23/2020 0500  TCO2 22 06/09/2014 1822   ACIDBASEDEF 8.7 (H) 10/18/2020 2300   O2SAT 93.6 10/23/2020 0500      Coagulation Profile: Recent Labs  Lab 10/14/2020 1649 10/20/20 0930 10/08/2020 2030  INR 2.0* 1.9* 1.9*     Cardiac Enzymes: No results for input(s): CKTOTAL, CKMB, CKMBINDEX, TROPONINI in the last 168 hours.  HbA1C: Hemoglobin A1C  Date/Time Value Ref Range Status  06/02/2014 12:03 AM 11.5 (H) 4.2 - 6.3 % Final    Comment:    The American Diabetes Association recommends that a primary goal of therapy should be <7% and that physicians should reevaluate the treatment regimen in patients with HbA1c values consistently >8%.   01/19/2012 01:52 AM 13.5 (H) 4.2 - 6.3 % Final    Comment:    The American Diabetes Association recommends that a primary goal of therapy should be <7% and that physicians should reevaluate the treatment regimen in patients with HbA1c values consistently >8%.    Hgb A1c MFr Bld  Date/Time Value Ref Range Status  10/20/2020 09:24 AM 7.5 (H) 4.8 - 5.6 % Final    Comment:    (NOTE) Pre diabetes:          5.7%-6.4%  Diabetes:              >6.4%  Glycemic control for   <7.0% adults with diabetes   05/26/2020 08:24 AM 8.0 (H) 4.8 - 5.6 % Final    Comment:    (NOTE) Pre diabetes:          5.7%-6.4%  Diabetes:              >6.4%  Glycemic control for   <7.0% adults with diabetes     CBG: Recent Labs  Lab 10/08/2020 2313 10/23/20 0246 10/23/20 0429 10/23/20 0730 10/23/20 1130  GLUCAP 182* 189* 216* 233* 226*     Review of Systems:   Unable to obtain due to  critical status  Past Medical History:  She,  has a past medical history of Anxiety, Atrial fibrillation ([place]), Atrial flutter ([place]), Coronary artery disease, Depression, Diabetic neuropathy ([place]), GERD (gastroesophageal reflux disease), HTN (hypertension), Hyperlipidemia LDL goal <70, IDDM (insulin dependent diabetes mellitus), Ischemic cardiomyopathy, Myocardial infarction ([place]) (2016), OA (osteoarthritis) of knee, Obesity, Osteomyelitis ([place]), Peripheral neuropathy, Peripheral vascular disease ([place]), and Tobacco abuse.   Surgical History:   Past Surgical History:  Procedure Laterality Date   ABDOMINAL AORTOGRAM W/LOWER EXTREMITY N/A 10/27/2016   Procedure: Abdominal Aortogram w/Lower Extremity;  Surgeon: Nelva Bush, MD;  Location: [facility];  Service: Cardiovascular;  Laterality: N/A;   AMPUTATION TOE Left 10/21/2015   Procedure: AMPUTATION TOE;  Surgeon: Sharlotte Alamo, DPM;  Location: [facility];  Service: Podiatry;  Laterality: Left;   AMPUTATION TOE Left 05/15/2017   Procedure: AMPUTATION LEFT GREAT TOE;  Surgeon: Sharlotte Alamo, DPM;  Location: [facility];  Service: Podiatry;  Laterality: Left;   AORTIC VALVE REPLACEMENT (AVR)/CORONARY ARTERY BYPASS GRAFTING (CABG)     BREAST BIOPSY     CARDIAC CATHETERIZATION  06/2014   95% stenosis mLAD, occlusion ostial OM1, 70% stenosis LCx, 95% stenosis mRCA, EF 45%.   CARDIOVERSION N/A 04/14/2020   Procedure: CARDIOVERSION with TEE;  Surgeon: Minna Merritts, MD;  Location: [facility];  Service: Cardiovascular;  Laterality: N/A;   CARDIOVERSION N/A 05/28/2020   Procedure: CARDIOVERSION;  Surgeon: Minna Merritts, MD;  Location: [facility];  Service: Cardiovascular;  Laterality: N/A;   CORONARY ARTERY BYPASS GRAFT N/A 06/08/2014   Procedure:  CORONARY ARTERY BYPASS GRAFTING (CABG);  Surgeon: Grace Isaac, MD;  Location: [facility];  Service: Open Heart Surgery;  Laterality: N/A;  Times 4 using left internal mammary artery to LAD artery and  endoscopically harvested bilateral saphenous vein to Obtuse Marginal, Diagonal and Posterior Descending coronary arteries.   CT ABD W & PELVIS WO CM  06/2014   nl liver, gallbladder, spleen, mild diverticular changes, no bowel wall inflammation, appendix nl, no hernia, no other sig abnormalities   GASTRIC ROUX-EN-Y N/A 02/09/2020   Procedure: LAPAROSCOPIC ROUX-EN-Y GASTRIC BYPASS WITH UPPER ENDOSCOPY;  Surgeon: Johnathan Hausen, MD;  Location: [facility];  Service: General;  Laterality: N/A;   LOWER EXTREMITY ANGIOGRAPHY Left 05/23/2017   Procedure: LOWER EXTREMITY ANGIOGRAPHY;  Surgeon: Algernon Huxley, MD;  Location: [facility];  Service: Cardiovascular;  Laterality: Left;   LOWER EXTREMITY ANGIOGRAPHY Left 05/28/2017   Procedure: LOWER EXTREMITY ANGIOGRAPHY;  Surgeon: Algernon Huxley, MD;  Location: [facility];  Service: Cardiovascular;  Laterality: Left;   LOWER EXTREMITY ANGIOGRAPHY Left 12/16/2018   Procedure: LOWER EXTREMITY ANGIOGRAPHY;  Surgeon: Algernon Huxley, MD;  Location: [facility];  Service: Cardiovascular;  Laterality: Left;   LOWER EXTREMITY ANGIOGRAPHY Right 12/23/2018   Procedure: LOWER EXTREMITY ANGIOGRAPHY;  Surgeon: Algernon Huxley, MD;  Location: [facility];  Service: Cardiovascular;  Laterality: Right;   LOWER EXTREMITY ANGIOGRAPHY Right 10/03/2020   Procedure: Lower Extremity Angiography;  Surgeon: Algernon Huxley, MD;  Location: [facility];  Service: Cardiovascular;  Laterality: Right;   TEE WITHOUT CARDIOVERSION N/A 06/08/2014   Procedure: TRANSESOPHAGEAL ECHOCARDIOGRAM (TEE);  Surgeon: Grace Isaac, MD;  Location: [facility];  Service: Open Heart Surgery;  Laterality: N/A;   TEE WITHOUT CARDIOVERSION N/A 04/14/2020   Procedure: TRANSESOPHAGEAL ECHOCARDIOGRAM (TEE);  Surgeon: Minna Merritts, MD;  Location: [facility];  Service: Cardiovascular;  Laterality: N/A;   TOE AMPUTATION Right 12/2011   rt middle toe   UPPER GI ENDOSCOPY N/A 02/09/2020    Procedure: UPPER GI ENDOSCOPY;  Surgeon: Johnathan Hausen, MD;  Location: [facility];  Service: General;  Laterality: N/A;   US ECHOCARDIOGRAPHY  06/2014   EF 50-55%, HK of inf/post/inferolat walls, Ao sclerosis     Social History:   reports that she has quit smoking. Her smoking use included cigarettes. She has a 20.00 pack-year smoking history. She has never used smokeless tobacco. She reports previous drug use. She reports that she does not drink alcohol.   Family History:  Her family history includes Asthma in an other family member; CAD in her father and mother; Diabetes in her father and mother; Hypertension in her mother; Sickle cell trait in an other family member.   Allergies No Known Allergies   Home Medications  Prior to Admission medications   Medication Sig Start Date End Date Taking? Authorizing Provider  amiodarone (PACERONE) 200 MG tablet Take 1 tablet (200 mg total) by mouth 2 (two) times daily. 09/03/20  Yes Hendricks Limes, MD  amLODipine (NORVASC) 5 MG tablet Take 5 mg by mouth daily.   Yes [provider]  apixaban (ELIQUIS) 5 MG TABS tablet Take 1 tablet (5 mg total) by mouth 2 (two) times daily. 10/14/2020  Yes Vickie Epley, MD  atorvastatin (LIPITOR) 80 MG tablet Take 1 tablet (80 mg total) by mouth daily at 6 PM. 09/03/20  Yes Hendricks Limes, MD  Cholecalciferol (VITAMIN D3) 50 MCG (2000 UT) TABS Take 2,000 Units  by mouth daily.   Yes [provider]  clotrimazole (LOTRIMIN) 1 % cream Apply 1 application topically 2 (two) times daily as needed.   Yes [provider]  docusate sodium (COLACE) 100 MG capsule Take 100 mg by mouth 2 (two) times daily.   Yes [provider]  DULoxetine (CYMBALTA) 60 MG capsule Take 1 capsule (60 mg total) by mouth daily. 09/03/20  Yes Hendricks Limes, MD  Emollient (CERAVE EX) Apply 1 application topically 2 (two) times daily as needed (dry skin).   Yes [provider]  furosemide (LASIX) 20 MG  tablet Take 2 tablets (40 mg total) by mouth daily as needed (fluid retention.). 09/03/20  Yes Hendricks Limes, MD  insulin aspart (NOVOLOG) 100 UNIT/ML injection Inject 4 Units into the skin 3 (three) times daily with meals. 08/25/20  Yes Wieting, Richard, MD  metoprolol tartrate (LOPRESSOR) 50 MG tablet TAKE 1 TABLET TWICE DAILY Patient taking differently: Take 50 mg by mouth 2 (two) times daily. 09/24/20  Yes Vickie Epley, MD  oxybutynin (DITROPAN) 5 MG tablet Take 5 mg by mouth 2 (two) times daily.  05/10/17  Yes [provider]  pantoprazole (PROTONIX) 40 MG tablet Take 1 tablet (40 mg total) by mouth daily. 09/03/20  Yes Hendricks Limes, MD  potassium chloride SA (KLOR-CON) 20 MEQ tablet TAKE 1 TABLET (20 MEQ TOTAL) BY MOUTH DAILY AS NEEDED. Patient taking differently: Take 20 mEq by mouth daily as needed (potassium replacement with furosemide). 09/03/20  Yes Hendricks Limes, MD  pregabalin (LYRICA) 100 MG capsule Take 1 capsule (100 mg total) by mouth 3 (three) times daily. 09/08/20  Yes Medina-Vargas, Monina C, NP  Semaglutide,0.25 or 0.5MG/DOS, (OZEMPIC, 0.25 OR 0.5 MG/DOSE,) 2 MG/1.5ML SOPN Inject 0.5 mg into the skin every Friday.   Yes [provider]  tiZANidine (ZANAFLEX) 4 MG capsule Take 1 capsule (4 mg total) by mouth 3 (three) times daily as needed for muscle spasms. 09/06/20  Yes Medina-Vargas, Monina C, NP  diltiazem (CARDIZEM) 60 MG tablet Take 1 tablet (60 mg total) by mouth 2 (two) times daily. Patient not taking: No sig reported 09/03/20   Hendricks Limes, MD  insulin glargine (LANTUS) 100 UNIT/ML injection Inject 0.06 mLs (6 Units total) into the skin at bedtime. Patient not taking: No sig reported 09/03/20   Hendricks Limes, MD     Critical care time: 45 min     Critical Care Time devoted to patient care services described in this note is  45 minutes.  Overall, patient is critically ill, prognosis is guarded.   Patient with Multiorgan failure and at  high risk for cardiac arrest and death.  Images reviewed directly and interpretation in A&P is my own unless noted Labs reviewed and evaluated as noted in A/P    D/W patient's brother at the bedside

## 2020-10-23 NOTE — Progress Notes (Signed)
Daily Progress Note   Subjective  - 2 Days Post-Op  Follow-up right heel ulceration.  Patient still currently on vent.  She is awake and alert.  Objective Vitals:   10/23/20 0700 10/23/20 0800 10/23/20 0840 10/23/20 0900  BP: (!) 134/52 (!) 144/69  140/67  Pulse: 71 73  71  Resp: (!) 24 (!) 21  16  Temp: (!) 96.08 F (35.6 C) (!) 97.16 F (36.2 C)  (!) 96.98 F (36.1 C)  TempSrc:  Esophageal    SpO2: 92% 93% 91% 91%  Weight:      Height:        Physical Exam: The lateral posterior heel decubitus ulceration still has foul-smelling necrotic infected tissue with surrounding discoloration.  Unfortunately the more proximal ulcerations appear to be more necrotic at this point.  She is also developed pressure blistering and ulceration on the anterior aspect of the ankle region.  Left foot ulceration is very superficial in nature.  She status post left great toe amputation and third toe amputation.         Laboratory CBC    Component Value Date/Time   WBC 19.6 (H) 10/23/2020 0330   HGB 8.8 (L) 10/23/2020 0330   HGB 13.1 10/10/2016 1911   HCT 24.4 (L) 10/23/2020 0330   HCT 40.6 10/10/2016 1911   PLT 339 10/23/2020 0330   PLT 301 10/10/2016 1911    BMET    Component Value Date/Time   NA 140 10/23/2020 0330   NA 139 10/10/2016 1911   NA 140 06/03/2014 0405   K 3.3 (L) 10/23/2020 0330   K 4.1 06/03/2014 0405   CL 104 10/23/2020 0330   CL 107 06/03/2014 0405   CO2 28 10/23/2020 0330   CO2 26 06/03/2014 0405   GLUCOSE 198 (H) 10/23/2020 0330   GLUCOSE 227 (H) 06/03/2014 0405   BUN 27 (H) 10/23/2020 0330   BUN 16 10/10/2016 1911   BUN 13 06/03/2014 0405   CREATININE 2.07 (H) 10/23/2020 0330   CREATININE 1.13 06/03/2014 0405   CALCIUM 7.3 (L) 10/23/2020 0330   CALCIUM 8.1 (L) 06/03/2014 0405   GFRNONAA 26 (L) 10/23/2020 0330   GFRNONAA 52 (L) 06/03/2014 0405   GFRNONAA 54 (L) 11/28/2013 1754   GFRAA 51 (L) 02/02/2020 1110   GFRAA >60 06/03/2014 0405   GFRAA  >60 11/28/2013 1754    Assessment/Planning: Osteomyelitis posterior lateral right heel with necrotic ulceration Necrotic ulcer right lower leg Severe peripheral vascular disease Diabetes  At this point given the worsening necrosis of the lateral lower leg ulceration and continued necrotic foul-smelling infected tissue with osteomyelitis of the heel I do not believe limb salvage will be beneficial at this point.  There has been progressive necrosis of the lateral leg as well as now superficial blistering of the anterior aspect of the right ankle.  My recommendation would be below the knee amputation at this time.  Continue with local wound care. Podiatry will continue to follow peripherally.  Patient is currently intubated.  Still in ICU.  She does respond to questions and is alert today. Would recommend reconsult to vascular surgery for discussion of amputation of the right lower leg  Samara Deist A  10/23/2020, 10:08 AM

## 2020-10-23 NOTE — Consult Note (Signed)
PHARMACY CONSULT NOTE - FOLLOW UP  Pharmacy Consult for Electrolyte Monitoring and Replacement   Recent Labs: Potassium (mmol/L)  Date Value  10/23/2020 3.3 (L)  06/03/2014 4.1   Magnesium (mg/dL)  Date Value  10/23/2020 2.0   Calcium (mg/dL)  Date Value  10/23/2020 7.3 (L)   Calcium, Total (mg/dL)  Date Value  06/03/2014 8.1 (L)   Albumin (g/dL)  Date Value  10/24/2020 2.1 (L)  10/10/2016 3.9  06/01/2014 3.2 (L)   Phosphorus (mg/dL)  Date Value  10/23/2020 2.1 (L)   Sodium (mmol/L)  Date Value  10/23/2020 140  10/10/2016 139  06/03/2014 140     Assessment: 65yo female w/ h/o  CKD 3B, IDDM, anxiety/MDD, HTN, Afib, OA, PAD (s/p Lt first toe amp & right toe amputation), HFrEF (EF30-35%), CAD (s/p CABG x4), sickle cell trait, tobacco dependency, s/p gastric bypass, presenting with chief concerns of right heel wound & osteomyelitis.   Goal of Therapy:  [place] WNL  Plan:  Potassium 10 mEq IV x 2 given this morning Continue to follow electrolytes as ordered by team  Tawnya Crook ,PharmD Clinical Pharmacist 10/23/2020 11:34 AM

## 2020-10-23 NOTE — Consult Note (Signed)
[place] for Heparin Drip Indication: ACS/NSTEMI  No Known Allergies  Patient Measurements: Height: 5\' 8"  (172.7 cm) Weight: 110.7 kg (244 lb) IBW/kg (Calculated) : 63.9 Heparin dosing wt 89.1 kg  Vital Signs: Temp: 97.52 F (36.4 C) (06/25 1400) Temp Source: Esophageal (06/25 0800) BP: 159/79 (06/25 1400) Pulse Rate: 91 (06/25 1400)  Labs: Recent Labs    10/24/2020 2030 10/01/2020 0000 10/02/2020 0620 10/16/2020 0645 10/09/2020 1018 10/01/2020 1303 10/04/2020 1505 09/30/2020 1628 10/18/2020 1720 10/23/20 0330 10/23/20 1425  HGB 9.0* 9.1* 8.8*  --   --   --   --   --   --  8.8*  --   HCT 27.5* 26.6* 25.0*  --   --   --   --   --   --  24.4*  --   PLT 337 345 345  --   --   --   --   --   --  339  --   APTT  --   --   --  158*  --   --  >200*  --  >200* >200* 121*  LABPROT 22.0*  --   --   --   --   --   --   --   --   --   --   INR 1.9*  --   --   --   --   --   --   --   --   --   --   HEPARINUNFRC  --   --   --  0.71*  --   --  0.82*  --   --  0.69  --   CREATININE 1.51*  --  1.64*  --   --   --   --   --   --  2.07*  --   TROPONINIHS 17 27*  --   --  48* 53*  --  46*  --   --   --      Estimated Creatinine Clearance: 34.4 mL/min (A) (by C-G formula based on SCr of 2.07 mg/dL (H)).   Medications: NKDA PTA apixiban dose 5mg  bid - last dose 6/21 @ 1000 Also on plavix 75mg  QD & ASA 81mg  QD Heparin dosing wt 89.1 kg  Assessment: 65 yo female with history of a fib (on eliquis) and PAD - pharmacy has been consulted to initiate and monitor a heparin drip pending surgical plan. 6/24:  Heparin gtt stopped on 6/23 @ 0755 in preparation for vascular surgery. APTT @ 1259 = 49, subtherapeutic but reflects ~ 5 hrs since heparin was d/c'd. Pt had elevated troponins on 6/24 @ 0000 so heparin was restarted for ACS/NSTEMI.   Date Time aPTT/HL Rate/Comment 6/21 1000 ---  Last dose of eliquis PTA; 1100 un/hr 6/22 0924 45s / >1.10 1100 un/hr > 1400  un/hr 6/23 0755 49s / --- Drip was off for 5hrs for Vasc Sx 6/24 0000 ---  Resumed for ACS/NSTEMI - trop elevation 6/24  0645 158s / 0.71 1700 > 1500 un/hr (hold 1hr; then lower rate) 6/24 1505 >200s/ 0.82 Both levels higher than expected given rate reduction --> recollect   6/24 1720 > 200s  Hold, 1 hour , then lower rate  1500 > 1150 units/hr 6/25     0330   > 200s / 0.69   Hold 1 hr then start lower rate of 800 units/hr 6/25 1425 121  Rate 800 > 600 units/hr  Goal of Therapy:  Heparin level 0.3-0.7 units/ml; aPTT 66-102s Monitor platelets by anticoagulation protocol: Yes   Plan:  aPTT supratherapeutic Decrease heparin drip to 600 units/hr Recheck aPTT at 2200 CBC daily while on heparin drip  Tawnya Crook, PharmD Clinical Pharmacist 10/23/2020 3:09 PM

## 2020-10-24 LAB — CBC
HCT: 24.5 % — ABNORMAL LOW (ref 36.0–46.0)
Hemoglobin: 8.4 g/dL — ABNORMAL LOW (ref 12.0–15.0)
MCH: 25.4 pg — ABNORMAL LOW (ref 26.0–34.0)
MCHC: 34.3 g/dL (ref 30.0–36.0)
MCV: 74 fL — ABNORMAL LOW (ref 80.0–100.0)
Platelets: 328 10*3/uL (ref 150–400)
RBC: 3.31 MIL/uL — ABNORMAL LOW (ref 3.87–5.11)
RDW: 18.8 % — ABNORMAL HIGH (ref 11.5–15.5)
WBC: 21.5 10*3/uL — ABNORMAL HIGH (ref 4.0–10.5)
nRBC: 1.3 % — ABNORMAL HIGH (ref 0.0–0.2)

## 2020-10-24 LAB — CULTURE, BLOOD (ROUTINE X 2)
Culture: NO GROWTH
Culture: NO GROWTH
Special Requests: ADEQUATE
Special Requests: ADEQUATE

## 2020-10-24 LAB — MAGNESIUM: Magnesium: 2 mg/dL (ref 1.7–2.4)

## 2020-10-24 LAB — GLUCOSE, CAPILLARY
Glucose-Capillary: 184 mg/dL — ABNORMAL HIGH (ref 70–99)
Glucose-Capillary: 162 mg/dL — ABNORMAL HIGH (ref 70–99)
Glucose-Capillary: 191 mg/dL — ABNORMAL HIGH (ref 70–99)
Glucose-Capillary: 186 mg/dL — ABNORMAL HIGH (ref 70–99)
Glucose-Capillary: 242 mg/dL — ABNORMAL HIGH (ref 70–99)
Glucose-Capillary: 243 mg/dL — ABNORMAL HIGH (ref 70–99)

## 2020-10-24 LAB — BASIC METABOLIC PANEL
Anion gap: 7 (ref 5–15)
BUN: 42 mg/dL — ABNORMAL HIGH (ref 8–23)
CO2: 27 mmol/L (ref 22–32)
Calcium: 7.1 mg/dL — ABNORMAL LOW (ref 8.9–10.3)
Chloride: 105 mmol/L (ref 98–111)
Creatinine, Ser: 2.58 mg/dL — ABNORMAL HIGH (ref 0.44–1.00)
GFR, Estimated: 20 mL/min — ABNORMAL LOW (ref >60)
Glucose, Bld: 175 mg/dL — ABNORMAL HIGH (ref 70–99)
Potassium: 3.4 mmol/L — ABNORMAL LOW (ref 3.5–5.1)
Sodium: 139 mmol/L (ref 135–145)

## 2020-10-24 LAB — PHOSPHORUS: Phosphorus: 2.1 mg/dL — ABNORMAL LOW (ref 2.5–4.6)

## 2020-10-24 LAB — PROCALCITONIN: Procalcitonin: 4.45 ng/mL

## 2020-10-24 LAB — APTT: aPTT: 87 seconds — ABNORMAL HIGH (ref 24–36)

## 2020-10-24 LAB — HEPARIN LEVEL (UNFRACTIONATED): Heparin Unfractionated: 0.44 IU/mL (ref 0.30–0.70)

## 2020-10-24 MED ORDER — FUROSEMIDE 10 MG/ML IJ SOLN
40.0000 mg | Freq: Once | INTRAMUSCULAR | Status: AC
  Administered 2020-10-24: 40 mg via INTRAVENOUS
  Filled 2020-10-24: qty 4

## 2020-10-24 MED ORDER — VANCOMYCIN HCL 1250 MG/250ML IV SOLN: 1250.0000 mg | INTRAVENOUS | Status: DC

## 2020-10-24 MED ORDER — PROPOFOL 1000 MG/100ML IV EMUL
5.0000 ug/kg/min | INTRAVENOUS | Status: DC
  Administered 2020-10-24: 20 ug/kg/min via INTRAVENOUS
  Administered 2020-10-24: 5 ug/kg/min via INTRAVENOUS
  Administered 2020-10-25 (×2): 25 ug/kg/min via INTRAVENOUS
  Filled 2020-10-24 (×4): qty 100

## 2020-10-24 MED ORDER — POTASSIUM & SODIUM PHOSPHATES 280-160-250 MG PO PACK
2.0000 | PACK | Freq: Once | ORAL | Status: AC
  Administered 2020-10-24: 2
  Filled 2020-10-24: qty 2

## 2020-10-24 MED ORDER — PIPERACILLIN-TAZOBACTAM 3.375 G IVPB
3.3750 g | Freq: Three times a day (TID) | INTRAVENOUS | Status: DC
  Administered 2020-10-24 – 2020-10-26 (×6): 3.375 g via INTRAVENOUS
  Filled 2020-10-24 (×6): qty 50

## 2020-10-24 MED FILL — Medication: Qty: 1 | Status: AC

## 2020-10-24 NOTE — Plan of Care (Signed)
Pt remains on norepi, Q2 turns, oral care performed. Pt neuro status remains same, no significant events.Handoff report given to [place] . Relinquishing care.  Problem: Education: Goal: Knowledge of General Education information will improve Description: Including pain rating scale, medication(s)/side effects and non-pharmacologic comfort measures Outcome: Progressing   Problem: Health Behavior/Discharge Planning: Goal: Ability to manage health-related needs will improve Outcome: Progressing   Problem: Clinical Measurements: Goal: Ability to maintain clinical measurements within normal limits will improve Outcome: Progressing Goal: Will remain free from infection Outcome: Progressing Goal: Diagnostic test results will improve Outcome: Progressing Goal: Respiratory complications will improve Outcome: Progressing Goal: Cardiovascular complication will be avoided Outcome: Progressing   Problem: Activity: Goal: Risk for activity intolerance will decrease Outcome: Progressing   Problem: Nutrition: Goal: Adequate nutrition will be maintained Outcome: Progressing   Problem: Coping: Goal: Level of anxiety will decrease Outcome: Progressing   Problem: Elimination: Goal: Will not experience complications related to bowel motility Outcome: Progressing Goal: Will not experience complications related to urinary retention Outcome: Progressing   Problem: Pain Managment: Goal: General experience of comfort will improve Outcome: Progressing   Problem: Safety: Goal: Ability to remain free from injury will improve Outcome: Progressing   Problem: Skin Integrity: Goal: Risk for impaired skin integrity will decrease Outcome: Progressing

## 2020-10-24 NOTE — Consult Note (Signed)
[place] for Heparin Drip Indication: ACS/NSTEMI  No Known Allergies  Patient Measurements: Height: 5\' 8"  (172.7 cm) Weight: 110.7 kg (244 lb) IBW/kg (Calculated) : 63.9 Heparin dosing wt 89.1 kg  Vital Signs: Temp: 97.7 F (36.5 C) (06/26 0300) Temp Source: Esophageal (06/26 0000) BP: 110/57 (06/26 0300) Pulse Rate: 81 (06/26 0300)  Labs: Recent Labs    10/20/2020 2030 10/06/2020 0000 10/02/2020 0620 10/24/2020 0645 10/15/2020 1018 10/17/2020 1303 10/18/2020 1505 10/06/2020 1628 10/12/2020 1720 10/23/20 0330 10/23/20 1425 10/23/20 2240 10/24/20 0430  HGB 9.0*   < > 8.8*  --   --   --   --   --   --  8.8*  --   --  8.4*  HCT 27.5*   < > 25.0*  --   --   --   --   --   --  24.4*  --   --  24.5*  PLT 337   < > 345  --   --   --   --   --   --  339  --   --  328  APTT  --   --   --    < >  --   --  >200*  --    < > >200* 121* 87* 87*  LABPROT 22.0*  --   --   --   --   --   --   --   --   --   --   --   --   INR 1.9*  --   --   --   --   --   --   --   --   --   --   --   --   HEPARINUNFRC  --   --   --    < >  --   --  0.82*  --   --  0.69  --   --  0.44  CREATININE 1.51*  --  1.64*  --   --   --   --   --   --  2.07*  --   --  2.58*  TROPONINIHS 17   < >  --   --  48* 53*  --  46*  --   --   --   --   --    < > = values in this interval not displayed.     Estimated Creatinine Clearance: 27.6 mL/min (A) (by C-G formula based on SCr of 2.58 mg/dL (H)).   Medications: NKDA PTA apixiban dose 5mg  bid - last dose 6/21 @ 1000 Also on plavix 75mg  QD & ASA 81mg  QD Heparin dosing wt 89.1 kg  Assessment: 65 yo female with history of a fib (on eliquis) and PAD - pharmacy has been consulted to initiate and monitor a heparin drip pending surgical plan. 6/24:  Heparin gtt stopped on 6/23 @ 0755 in preparation for vascular surgery. APTT @ 1259 = 49, subtherapeutic but reflects ~ 5 hrs since heparin was d/c'd. Pt had elevated troponins on 6/24 @ 0000 so  heparin was restarted for ACS/NSTEMI.   Date Time aPTT/HL Rate/Comment 6/21 1000 ---  Last dose of eliquis PTA; 1100 un/hr 6/22 0924 45s / >1.10 1100 un/hr > 1400 un/hr 6/23 0755 49s / --- Drip was off for 5hrs for Vasc Sx 6/24 0000 ---  Resumed for ACS/NSTEMI - trop elevation 6/24  0645 158s / 0.71 1700 >  1500 un/hr (hold 1hr; then lower rate) 6/24 1505 >200s/ 0.82 Both levels higher than expected given rate reduction --> recollect   6/24 1720 > 200s  Hold, 1 hour , then lower rate  1500 > 1150 units/hr 6/25     0330   > 200s / 0.69   Hold 1 hr then start lower rate of 800 units/hr 6/25 1425 121  Rate 800 > 600 units/hr 6/25     2240     87                  600 units/hr , therapeutic X 1 6/26     0430     87 s/ 0.44      600 units/hr, therapeutic X 2     Goal of Therapy:  Heparin level 0.3-0.7 units/ml; aPTT 66-102s Monitor platelets by anticoagulation protocol: Yes   Plan:  6/26 @ 0430 :  aPTT = 87, HL = 0.44, therapeutic X 2 aPTT and HL are both therapeutic so will use HL to guide dosing from here on.  Will continue pt on current rate and recheck HL on 6/27 with AM labs.   Orene Desanctis, PharmD Clinical Pharmacist 10/24/2020 5:14 AM

## 2020-10-24 NOTE — Consult Note (Signed)
Pharmacy Antibiotic Note  Melinda [name] is a 65 y.o. female admitted on 10/18/2020 with non-healing right heel wound.  Pharmacy has been consulted for vancomycin dosing for wound infection.    Plan: Creatinine continues to increase.  Adjust vancomycin to 1250 mg Q48H Goal AUC 400-550 Predicted AUC: 510 Scr used: 2.58  With increasing creatinine and current plan for vanc to continue, will order vancomycin level with morning labs to assess clearance.  Height: 5\' 8"  (172.7 cm) Weight: 110.7 kg (244 lb) IBW/kg (Calculated) : 63.9  Temp (24hrs), Avg:97.3 F (36.3 C), Min:96.8 F (36 C), Max:97.7 F (36.5 C)  Recent Labs  Lab 10/09/2020 0519 10/24/2020 2030 10/10/2020 0000 10/24/2020 0050 10/16/2020 0620 09/30/2020 0645 10/02/2020 1018 10/23/2020 1303 10/06/2020 1505 10/23/20 0330 10/24/20 0430  WBC 13.6* 26.1* 24.9*  --  29.0*  --   --   --   --  19.6* 21.5*  CREATININE 1.16* 1.51*  --   --  1.64*  --   --   --   --  2.07* 2.58*  LATICACIDVEN  --  >11.0*  --    < >  --  5.1* 6.0* 2.4* 2.0* 1.8  --    < > = values in this interval not displayed.     Estimated Creatinine Clearance: 27.6 mL/min (A) (by C-G formula based on SCr of 2.58 mg/dL (H)).    No Known Allergies  Antimicrobials this admission: 6/21 cefepime + Flagyl --> Unasyn 6/23>> 6/21 vancomycin >>    Microbiology results: 6/21 BCx: NG  Thank you for allowing pharmacy to be a part of this patient's care.  Tawnya Crook, PharmD Clinical Pharmacist   10/24/2020 8:12 AM

## 2020-10-24 NOTE — Progress Notes (Signed)
NAME:  Melinda [name], MRN:  [id number], DOB:  Jun 10, 1955, LOS: 4 ADMISSION DATE:  10/01/2020, CONSULTATION DATE:  10/07/2020 REFERRING MD:  Judd Gaudier, MD CHIEF COMPLAINT:  Respiratory arrest, circulatory shock   History of Present Illness:  65 year old female with CAD s/p CABG, chronic systolic heart failure  EF 30-35%, PVD s/p stents in left SFA, atrial fibrillation, stage III CKD admitted to hospitalist for sepsis secondary to OM of right heel  Underwent right lower extremity angiography on 6/23. Post-procedure no immediate complications however this evening she complained of chest/abdominal pain. She then went into respiratory arrest and code blue called and she was intubated and transferred to the ICU. On arrival to the ICU she was noted to be in ventricular tachycardia without a pulse. CPR was initiated and ROSC was achieved after 5 minutes. She received epi, sodium bicarb, amiodarone. Levophed was started for hypotension. She then developed bradycardia and received atropine 1 mg with some response. She continued to have intermittent episodes of bradycardia so epi gtt was added.  Pertinent  Medical History  CAD s/p CABG, chronic systolic heart failure EF 30-35%, PVD s/p stents in left SFA, atrial fibrillation, stage III CKD   Significant Hospital Events: Including procedures, antibiotic start and stop dates in addition to other pertinent events   6/21 6/23: Suffered respiratory arrest with subsequent V. Tach arrest  6/24: Awake and able to follow commands, weaning down vasopressors, Cardiology consulted 6/26 severe hypoxia, remains on vent  Interim History / Subjective:  Remains on vent Severe hypoxia Multiorgan failure   Objective   Blood pressure (!) 123/52, pulse 87, temperature 97.7 F (36.5 C), resp. rate 14, height 5\' 8"  (1.727 m), weight 110.7 kg, SpO2 98 %.    Vent Mode: PCV FiO2 (%):  [40 %-60 %] 60 % Set Rate:  [10 bmp-16 bmp] 10 bmp Vt Set:  [500 mL] 500  mL PEEP:  [5 cmH20-15 cmH20] 15 cmH20 Pressure Support:  [10 cmH20] 10 cmH20 Plateau Pressure:  [21 cmH20-32 cmH20] 32 cmH20   Intake/Output Summary (Last 24 hours) at 10/24/2020 0746 Last data filed at 10/24/2020 0700 Gross per 24 hour  Intake 2313.01 ml  Output 980 ml  Net 1333.01 ml    Filed Weights   10/05/2020 1416 10/24/2020 0801  Weight: 110.7 kg 110.7 kg     REVIEW OF SYSTEMS  PATIENT IS UNABLE TO PROVIDE COMPLETE REVIEW OF SYSTEMS DUE TO SEVERE CRITICAL ILLNESS AND TOXIC METABOLIC ENCEPHALOPATHY   PHYSICAL EXAMINATION:  GENERAL:critically ill appearing, +resp distress HEAD: Normocephalic, atraumatic.  EYES: Pupils equal, round, reactive to light.  No scleral icterus.  MOUTH: Moist mucosal membrane. NECK: Supple. No thyromegaly. No nodules. No JVD.  PULMONARY: +rhonchi, +wheezing CARDIOVASCULAR: S1 and S2. Regular rate and rhythm. No murmurs, rubs, or gallops.  GASTROINTESTINAL: Soft, nontender, -distended. Positive bowel sounds.  MUSCULOSKELETAL: RT heel ulcer NEUROLOGIC: obtunded SKIN:intact,warm,dry   Labs/imaging that I have personally reviewed  (right click and "Reselect all SmartList Selections" daily)  Labs 10/18/2020: glucose 210, BUN 22, Cr. 1.64, WBC 29, lactic acid 5.1, Hgb 8.8 ABG: 7.53 /29/114/24.2    CXR: ETT in place. R IJ CVC over SVC. No pneumothorax. Bilaterally interstitial opacities with worsening bases. Cardiomegaly (concerning for pulmonary edema)    Assessment & Plan:   65 yo morbidly obese AAF with acute and severe ischemic cardiomyopathy with acute and severe Cardiogenic/septic shock with severe metabolic encephalopathy with severe acute systolic heart failure   Severe ACUTE Hypoxic and Hypercapnic Respiratory  Failure -continue Mechanical Ventilator support -continue Bronchodilator Therapy -Wean Fio2 and PEEP as tolerated -VAP/VENT bundle implementation   Cardiogenic shock +/- Septic Shock VT arrest Hx CABG, chronic systolic  heart failure Atrial fib/flutter -Continue Heparin and Amiodarone drips -Goal serum Potassium of 4 and Magnesium 2 -Continue ASA, Plavix, statin -Trend HS Troponin, peaked 53  -Normothermia protocol -Cardiology consulted, appreciate input ~ likely will need CardiacCATH prior to discharge -Diuresis as BP and renal function permits ~ currently holding diuresis due to vasopressors   NEUROLOGY ACUTE TOXIC METABOLIC ENCEPHALOPATHY -need for sedation -Goal RASS -2 to -3  Septic shock RT heel ulcer likely source -use vasopressors to keep MAP>65 -follow ABG and LA -follow up cultures -emperic ABX -consider stress dose steroids -aggressive IV fluid Resuscitation -S/p Angioplasty, mechanical thrombectomy of RLE on 10/27/2020  ACUTE KIDNEY INJURY/Renal Failure CKD 3 -continue Foley Catheter-assess need -Avoid nephrotoxic agents -Follow urine output, BMP -Ensure adequate renal perfusion, optimize oxygenation -Renal dose medications   ENDO - ICU hypoglycemic\Hyperglycemia protocol -check FSBS per protocol     Best Practice (right click and "Reselect all SmartList Selections" daily)   Diet/type: NPO, start tube feeds Pain/Anxiety/Delirium protocol RASS goal: 0 to -1 VAP protocol (if indicated): Yes DVT prophylaxis: systemic heparin GI prophylaxis: PPI Glucose control:  SSI Central venous access:  Yes, and it is still needed Arterial line:  Yes, and it is still needed Foley:  Yes, and it is still needed Mobility:  bed rest  PT consulted: N/A Studies pending: Echocardiogram Culture data pending:blood Last reviewed culture data:today Antibiotics:unasyn and vanc Antibiotic de-escalation: no,  continue current rx Stop date: to be determined  Code Status:  full code Last date of multidisciplinary goals of care discussion [6/24]  ccm prognosis: Life-threating Disposition: remains critically ill, will stay in intensive care    Labs   CBC: Recent Labs  Lab 10/01/2020 1419  10/20/20 0930 10/11/2020 2030 10/13/2020 0000 10/27/2020 0620 10/23/20 0330 10/24/20 0430  WBC 14.7*   < > 26.1* 24.9* 29.0* 19.6* 21.5*  NEUTROABS 12.0*  --   --   --   --   --   --   HGB 10.0*   < > 9.0* 9.1* 8.8* 8.8* 8.4*  HCT 29.8*   < > 27.5* 26.6* 25.0* 24.4* 24.5*  MCV 77.2*   < > 79.0* 77.1* 73.5* 72.8* 74.0*  PLT 351   < > 337 345 345 339 328   < > = values in this interval not displayed.     Basic Metabolic Panel: Recent Labs  Lab 10/10/2020 0519 10/08/2020 2030 10/18/2020 0000 10/20/2020 0620 10/23/20 0330 10/24/20 0430  NA 143 137  --  140 140 139  K 3.7 4.7  --  3.6 3.3* 3.4*  CL 105 103  --  105 104 105  CO2 32 17*  --  23 28 27   GLUCOSE 134* 218*  --  210* 198* 175*  BUN 14 18  --  22 27* 42*  CREATININE 1.16* 1.51*  --  1.64* 2.07* 2.58*  CALCIUM 8.1* 7.8*  --  7.6* 7.3* 7.1*  MG 2.0 2.4 2.0 1.7 2.0 2.0  PHOS  --   --   --   --  2.1* 2.1*    GFR: Estimated Creatinine Clearance: 27.6 mL/min (A) (by C-G formula based on SCr of 2.58 mg/dL (H)). Recent Labs  Lab 10/20/20 0930 10/13/2020 0519 10/15/2020 0000 10/16/2020 0050 10/15/2020 5361 10/23/2020 0645 10/27/2020 1018 10/06/2020 1303 10/24/2020 1505 10/23/20 0330 10/24/20 0430  PROCALCITON <0.10  --   --   --   --   --  2.80  --   --  3.85 4.45  WBC 15.0*   < > 24.9*  --  29.0*  --   --   --   --  19.6* 21.5*  LATICACIDVEN  --    < >  --    < >  --    < > 6.0* 2.4* 2.0* 1.8  --    < > = values in this interval not displayed.     Liver Function Tests: Recent Labs  Lab 10/20/2020 1419  AST 19  ALT 17  ALKPHOS 140*  BILITOT 1.5*  PROT 6.1*  ALBUMIN 2.1*    No results for input(s): LIPASE, AMYLASE in the last 168 hours. No results for input(s): AMMONIA in the last 168 hours.  ABG    Component Value Date/Time   PHART 7.51 (H) 10/23/2020 0500   PCO2ART 34 10/23/2020 0500   PO2ART 62 (L) 10/23/2020 0500   HCO3 27.1 10/23/2020 0500   TCO2 22 06/09/2014 1822   ACIDBASEDEF 8.7 (H) 10/12/2020 2300   O2SAT 93.6  10/23/2020 0500      Coagulation Profile: Recent Labs  Lab 10/18/2020 1649 10/20/20 0930 10/07/2020 2030  INR 2.0* 1.9* 1.9*     Cardiac Enzymes: No results for input(s): CKTOTAL, CKMB, CKMBINDEX, TROPONINI in the last 168 hours.  HbA1C: Hemoglobin A1C  Date/Time Value Ref Range Status  06/02/2014 12:03 AM 11.5 (H) 4.2 - 6.3 % Final    Comment:    The American Diabetes Association recommends that a primary goal of therapy should be <7% and that physicians should reevaluate the treatment regimen in patients with HbA1c values consistently >8%.   01/19/2012 01:52 AM 13.5 (H) 4.2 - 6.3 % Final    Comment:    The American Diabetes Association recommends that a primary goal of therapy should be <7% and that physicians should reevaluate the treatment regimen in patients with HbA1c values consistently >8%.    Hgb A1c MFr Bld  Date/Time Value Ref Range Status  10/20/2020 09:24 AM 7.5 (H) 4.8 - 5.6 % Final    Comment:    (NOTE) Pre diabetes:          5.7%-6.4%  Diabetes:              >6.4%  Glycemic control for   <7.0% adults with diabetes   05/26/2020 08:24 AM 8.0 (H) 4.8 - 5.6 % Final    Comment:    (NOTE) Pre diabetes:          5.7%-6.4%  Diabetes:              >6.4%  Glycemic control for   <7.0% adults with diabetes     CBG: Recent Labs  Lab 10/23/20 1607 10/23/20 1956 10/23/20 2336 10/24/20 0315 10/24/20 0742  GLUCAP 237* 223* 269* 184* 162*       DVT/GI PRX  assessed I Assessed the need for Labs I Assessed the need for Foley I Assessed the need for Central Venous Line Family Discussion when available I Assessed the need for Mobilization I made an Assessment of medications to be adjusted accordingly Safety Risk assessment completed  CASE DISCUSSED IN MULTIDISCIPLINARY ROUNDS WITH ICU TEAM     Critical Care Time devoted to patient care services described in this note is 50 minutes.  Critical care was necessary to treat /prevent imminent and  life-threatening deterioration. Overall, patient is  critically ill, prognosis is guarded.  Patient with Multiorgan failure and at high risk for cardiac arrest and death.    Corrin Parker, M.D.  [facility]  Medical Director [place] [facility]

## 2020-10-24 NOTE — Progress Notes (Signed)
Progress Note  Patient Name: Melinda [name] Date of Encounter: 10/24/2020  [name] Cardiologist: Ida Rogue, MD   Subjective   Intubated, arousable.  No further episodes of arrhythmia on telemetry.  On amiodarone drip.  Urine output low as per RN.  Inpatient Medications    Scheduled Meds:  acetaminophen  650 mg Per Tube Q4H   Or   acetaminophen (TYLENOL) oral liquid 160 mg/5 mL  650 mg Per Tube Q4H   Or   acetaminophen  650 mg Rectal Q4H   aspirin  81 mg Per Tube Daily   atorvastatin  80 mg Per Tube QHS   chlorhexidine  60 mL Topical Once   chlorhexidine gluconate (MEDLINE KIT)  15 mL Mouth Rinse BID   Chlorhexidine Gluconate Cloth  6 each Topical Daily   cholecalciferol  2,000 Units Per Tube Daily   clopidogrel  75 mg Per Tube Daily   docusate  100 mg Per Tube BID   feeding supplement (PIVOT 1.5 CAL)  1,000 mL Per Tube Q24H   feeding supplement (PROSource TF)  45 mL Per Tube QID   free water  30 mL Per Tube Q4H   furosemide  40 mg Intravenous Once   insulin aspart  0-15 Units Subcutaneous Q4H   insulin glargine  6 Units Subcutaneous QHS   ipratropium-albuterol  3 mL Nebulization Q4H   mouth rinse  15 mL Mouth Rinse 10 times per day   multivitamin with minerals  1 tablet Per Tube Daily   nutrition supplement (JUVEN)  1 packet Per Tube BID BM   oxybutynin  5 mg Per Tube BID   pantoprazole (PROTONIX) IV  40 mg Intravenous QHS   polyethylene glycol  17 g Per Tube Daily   povidone-iodine  2 application Topical Once   pregabalin  100 mg Per Tube TID   Continuous Infusions:  sodium chloride Stopped (10/11/2020 1909)   amiodarone 30 mg/hr (10/24/20 1100)   epinephrine Stopped (10/02/2020 0911)   heparin 600 Units/hr (10/24/20 3557)   norepinephrine (LEVOPHED) Adult infusion 9 mcg/min (10/24/20 1108)   piperacillin-tazobactam (ZOSYN)  IV 3.375 g (10/24/20 1152)   propofol (DIPRIVAN) infusion 10 mcg/kg/min (10/24/20 0939)   sodium chloride     PRN  Meds: acetaminophen **OR** acetaminophen, HYDROmorphone (DILAUDID) injection, povidone-Iodine   Vital Signs    Vitals:   10/24/20 1015 10/24/20 1030 10/24/20 1045 10/24/20 1100  BP:  (!) 154/65  (!) 141/65  Pulse: (!) 103 (!) 104    Resp: (!) 9 (!) 9 (!) 8 (!) 9  Temp: 98.42 F (36.9 C) 98.6 F (37 C) 98.6 F (37 C) 98.78 F (37.1 C)  TempSrc:      SpO2: 97% 98%    Weight:      Height:        Intake/Output Summary (Last 24 hours) at 10/24/2020 1226 Last data filed at 10/24/2020 0700 Gross per 24 hour  Intake 2233.01 ml  Output 980 ml  Net 1253.01 ml   Last 3 Weights 10/10/2020 09/29/2020 10/07/2020  Weight (lbs) 244 lb 244 lb 244 lb  Weight (kg) 110.678 kg 110.678 kg 110.678 kg  Some encounter information is confidential and restricted. Go to Review Flowsheets activity to see all data.      Telemetry    Sinus rhythm- Personally Reviewed  ECG    No new ECG obtained- Personally Reviewed  Physical Exam   GEN: Intubated, arousable Neck: No JVD Cardiac: RRR, distant heart sounds Respiratory: Vented breath sounds  GI: Soft, nontender, non-distended  MS: No edema; No deformity. Neuro:  Nonfocal  Psych: Unable to assess  Labs    High Sensitivity Troponin:   Recent Labs  Lab 09/29/2020 2030 10/24/2020 0000 10/04/2020 1018 10/12/2020 1303 10/20/2020 1628  TROPONINIHS 17 27* 48* 53* 46*      Chemistry Recent Labs  Lab 10/23/2020 1419 10/20/20 0930 10/06/2020 0620 10/23/20 0330 10/24/20 0430  NA 138   < > 140 140 139  K 2.5*   < > 3.6 3.3* 3.4*  CL 98   < > 105 104 105  CO2 31   < > _0 GLUCOSE 128*   < > 210* 198* 175*  BUN 11   < > 22 27* 42*  CREATININE 1.13*   < > 1.64* 2.07* 2.58*  CALCIUM 7.7*   < > 7.6* 7.3* 7.1*  PROT 6.1*  --   --   --   --   ALBUMIN 2.1*  --   --   --   --   AST 19  --   --   --   --   ALT 17  --   --   --   --   ALKPHOS 140*  --   --   --   --   BILITOT 1.5*  --   --   --   --   GFRNONAA 53*   < > 34* 26* 20*  ANIONGAP 9   < >  _1 < > = values in this interval not displayed.     Hematology Recent Labs  Lab 10/14/2020 0620 10/23/20 0330 10/24/20 0430  WBC 29.0* 19.6* 21.5*  RBC 3.40* 3.35* 3.31*  HGB 8.8* 8.8* 8.4*  HCT 25.0* 24.4* 24.5*  MCV 73.5* 72.8* 74.0*  MCH 25.9* 26.3 25.4*  MCHC 35.2 36.1* 34.3  RDW 19.2* 19.2* 18.8*  PLT 345 339 328    BNPNo results for input(s): BNP, PROBNP in the last 168 hours.   DDimer No results for input(s): DDIMER in the last 168 hours.   Radiology    DG Chest Port 1 View  Result Date: 10/23/2020 CLINICAL DATA:  Acute respiratory failure with hypoxia EXAM: PORTABLE CHEST 1 VIEW COMPARISON:  10/12/2020 FINDINGS: The endotracheal tube tip is 4.3 cm from the carina. Enteric tube is in place with tip below the diaphragm not included in the field of view. Right central line tip overlies the SVC. Stable cardiomegaly post CABG. Worsening bibasilar aeration with otherwise stable diffuse bilateral lung opacities. Possible small pleural effusions. No pneumothorax. Overlying monitoring devices partially obscure detailed assessment IMPRESSION: 1. Worsening bibasilar aeration with otherwise stable diffuse bilateral lung opacities, pulmonary edema versus multifocal pneumonia. Question small pleural effusions. 2. Stable cardiomegaly post CABG. 3. Stable support apparatus. Electronically Signed   By: Melinda [name] M.D.   On: 10/23/2020 01:01   EEG adult  Result Date: 10/06/2020 Melinda Jack, MD     09/30/2020  3:34 PM Routine EEG Report Melinda [name] is a 65 y.o. female with a history of cardiac arrest who is undergoing an EEG to evaluate for seizures. Report: This EEG was acquired with electrodes placed according to the International 10-20 electrode system (including Fp1, Fp2, F3, F4, C3, C4, P3, P4, O1, O2, T3, T4, T5, T6, A1, A2, Fz, Cz, Pz). The following electrodes were missing or displaced: none. The background was continuous and composed primarily of theta frequencies  5-7 Hz. This activity is  reactive to stimulation. Some sleep architecture (K complexes) was noted. There was no focal slowing. There were no interictal epileptiform discharges. There were no electrographic seizures identified. There was no abnormal response to photic stimulation. Hyperventilation was not performed 2/2 patient being on a ventilator. Impression and clinical correlation: This EEG was obtained while awake and asleep and was abnormal due to moderate diffuse slowing. No epileptiform abnormalities were noted during this study. Su Monks, MD [facility] [phone] If 7pm- 7am, please page neurology on call as listed in [place].    Cardiac Studies   Echo 10/15/2020 1. Left ventricular ejection fraction, by estimation, is 45%. The left  ventricle has mild to moderately decreased function. The left ventricle  demonstrates global hypokinesis. There is moderate left ventricular  hypertrophy. Left ventricular diastolic  parameters are consistent with Grade II diastolic dysfunction  (pseudonormalization). Elevated left atrial pressure. The average left  ventricular global longitudinal strain is -12.2 %. The global longitudinal  strain is abnormal.   2. Pulmonary artery pressure is at least mildly to moderately elevated  (RVSP 35-40 mmHg plus central venous pressure). Right ventricular systolic  function is moderately reduced. The right ventricular size is mildly  enlarged.   3. Left atrial size was moderately dilated.   4. Right atrial size was mildly dilated.   5. The mitral valve is normal in structure. Mild mitral valve  regurgitation. No evidence of mitral stenosis.   6. Tricuspid valve regurgitation is mild to moderate.   7. The aortic valve has an indeterminant number of cusps. There is mild  calcification of the aortic valve. There is mild thickening of the aortic  valve. Aortic valve regurgitation is not visualized. Mild aortic valve  sclerosis is present, with no   evidence of aortic valve stenosis.   Patient Profile     65 y.o. female history of CAD/CABG x4, atrial flutter/DC cardioversion, paroxysmal A. fib, PAD, CKD 3, diabetes presenting with right heel ulcer s/p thrombectomy and angioplasty of right popliteal artery.  Hospital course complicated by abnormal breath sounds and wide-complex tachycardia.  Intubated for airway protection.  Episode of paroxysmal A. fib noted.  Assessment & Plan    1.  Wide-complex tachycardia -No further episodes of arrhythmia  -Continue amiodarone iv -Plan for ischemic work-up/left heart cath when respiratory function and renal function permits.  2.  History of CAD -Aspirin, Plavix, Lipitor  3.  Paroxysmal A. fib, -Maintaining sinus on amiodarone -On heparin drip  4.  Renal dysfunction -Volume overload, edema on chest x-ray, worsening renal function, urine output reduced -Dose IV Lasix 40 mg x 1.  Monitor creatinine, urine output, respiratory status via chest x-ray.  5.  PAD s/p mechanical thrombectomy, angioplasty of right popliteal artery -Aspirin, Plavix, Lipitor -vascular surgery following  6.  Respiratory dysfunction -Intubated, vent settings management as per critical care team  Total encounter time 35 minutes  Greater than 50% was spent in counseling and coordination of care with the patient    Signed, Kate Sable, MD  10/24/2020, 12:26 PM

## 2020-10-24 NOTE — Consult Note (Signed)
PHARMACY CONSULT NOTE - FOLLOW UP  Pharmacy Consult for Electrolyte Monitoring and Replacement   Recent Labs: Potassium (mmol/L)  Date Value  10/24/2020 3.4 (L)  06/03/2014 4.1   Magnesium (mg/dL)  Date Value  10/24/2020 2.0   Calcium (mg/dL)  Date Value  10/24/2020 7.1 (L)   Calcium, Total (mg/dL)  Date Value  06/03/2014 8.1 (L)   Albumin (g/dL)  Date Value  10/05/2020 2.1 (L)  10/10/2016 3.9  06/01/2014 3.2 (L)   Phosphorus (mg/dL)  Date Value  10/24/2020 2.1 (L)   Sodium (mmol/L)  Date Value  10/24/2020 139  10/10/2016 139  06/03/2014 140     Assessment: 65yo female w/ h/o  CKD 3B, IDDM, anxiety/MDD, HTN, Afib, OA, PAD (s/p Lt first toe amp & right toe amputation), HFrEF (EF30-35%), CAD (s/p CABG x4), sickle cell trait, tobacco dependency, s/p gastric bypass, presenting with chief concerns of right heel wound & osteomyelitis.   Goal of Therapy:  Lytes WNL  Plan:  Phos Nak 2 packets x 1 dose Continue to follow electrolytes as ordered by team  Tawnya Crook ,PharmD Clinical Pharmacist 10/24/2020 8:08 AM

## 2020-10-24 NOTE — Consult Note (Signed)
Pharmacy Antibiotic Note  Tyla Burgner is a 65 y.o. female with medical history for CAD s/p CABG, chronic systolic heart failure, PVD s/p stents in left SFA, afib, stage III CKD admitted on 10/09/2020 with non-healing right heel wound. Pt underwent right lower extremity angiography on 6/23. Pt was started on cefepime, flagyl and vanc then switched to vancomycin and Unasyn. Pharmacy has been consulted for Zosyn dosing for wound infection.  Plan: Start Zosyn 3.375 g IV q8 hr extended infusion.   Height: 5\' 8"  (172.7 cm) Weight: 110.7 kg (244 lb) IBW/kg (Calculated) : 63.9  Temp (24hrs), Avg:97.5 F (36.4 C), Min:96.8 F (36 C), Max:98.6 F (37 C)  Recent Labs  Lab 10/18/2020 0519 10/16/2020 2030 10/16/2020 0000 10/23/2020 0050 09/29/2020 0620 10/24/2020 0645 10/20/2020 1018 10/28/2020 1303 10/18/2020 1505 10/23/20 0330 10/24/20 0430  WBC 13.6* 26.1* 24.9*  --  29.0*  --   --   --   --  19.6* 21.5*  CREATININE 1.16* 1.51*  --   --  1.64*  --   --   --   --  2.07* 2.58*  LATICACIDVEN  --  >11.0*  --    < >  --  5.1* 6.0* 2.4* 2.0* 1.8  --    < > = values in this interval not displayed.     Estimated Creatinine Clearance: 27.6 mL/min (A) (by C-G formula based on SCr of 2.58 mg/dL (H)).    No Known Allergies  Antimicrobials this admission: 6/21 cefepime + Flagyl --> Unasyn 6/23>> 6/26 6/21 vancomycin >> 6/26 6/26 Zosyn >>   Microbiology results: 6/21 BCx: NGTD 6/24 Wcx (foot): few GPC and GNR  Thank you for allowing pharmacy to be a part of this patient's care.  Sherilyn Banker, PharmD Clinical Pharmacist   10/24/2020 11:03 AM

## 2020-10-24 NOTE — Progress Notes (Addendum)
GOALS OF CARE DISCUSSION  The Clinical status was relayed to family in detail. Sister at bedside Patient does not have husband and children  Updated and notified of patients medical condition.    Patient remains unresponsive and will not open eyes to command.   Patient is having a weak cough and struggling to remove secretions.   Patient with increased WOB and using accessory muscles to breathe Explained to family course of therapy and the modalities  Failed  weaning trials  Patient with multiorgan failure On pressors Severe hypoxia    Patient with Progressive multiorgan failure with a very high probablity of a very minimal chance of meaningful recovery despite all aggressive and optimal medical therapy.  PATIENT REMAINS FULL CODE    Family are satisfied with Plan of action and management. All questions answered  Additional CC time 25 mins   Shadavia Dampier Patricia Pesa, M.D.  [facility]  Medical Director [place] Director [facility] [place] Cardio-Pulmonary Department

## 2020-10-25 ENCOUNTER — Other Ambulatory Visit: Payer: Self-pay | Admitting: Cardiovascular Disease

## 2020-10-25 ENCOUNTER — Encounter: Payer: Self-pay | Admitting: Obstetrics and Gynecology

## 2020-10-25 ENCOUNTER — Encounter: Admission: EM | Disposition: E | Payer: Self-pay | Source: Home / Self Care | Attending: Internal Medicine

## 2020-10-25 ENCOUNTER — Inpatient Hospital Stay: Payer: Medicare HMO | Admitting: Anesthesiology

## 2020-10-25 DIAGNOSIS — I96 Gangrene, not elsewhere classified: Secondary | ICD-10-CM

## 2020-10-25 DIAGNOSIS — R6521 Severe sepsis with septic shock: Secondary | ICD-10-CM

## 2020-10-25 DIAGNOSIS — R6520 Severe sepsis without septic shock: Secondary | ICD-10-CM

## 2020-10-25 DIAGNOSIS — I739 Peripheral vascular disease, unspecified: Secondary | ICD-10-CM

## 2020-10-25 DIAGNOSIS — Z794 Long term (current) use of insulin: Secondary | ICD-10-CM

## 2020-10-25 HISTORY — PX: MINOR APPLICATION OF WOUND VAC: SHX6243

## 2020-10-25 HISTORY — PX: AMPUTATION: SHX166

## 2020-10-25 LAB — BASIC METABOLIC PANEL
Anion gap: 8 (ref 5–15)
BUN: 47 mg/dL — ABNORMAL HIGH (ref 8–23)
CO2: 26 mmol/L (ref 22–32)
Calcium: 7.1 mg/dL — ABNORMAL LOW (ref 8.9–10.3)
Chloride: 104 mmol/L (ref 98–111)
Creatinine, Ser: 2.97 mg/dL — ABNORMAL HIGH (ref 0.44–1.00)
GFR, Estimated: 17 mL/min — ABNORMAL LOW (ref >60)
Glucose, Bld: 247 mg/dL — ABNORMAL HIGH (ref 70–99)
Potassium: 4 mmol/L (ref 3.5–5.1)
Sodium: 138 mmol/L (ref 135–145)

## 2020-10-25 LAB — BLOOD GAS, ARTERIAL
Acid-base deficit: 0.3 mmol/L (ref 0.0–2.0)
Bicarbonate: 24.8 mmol/L (ref 20.0–28.0)
FIO2: 0.35
Mechanical Rate: 10
O2 Saturation: 90 %
PEEP: 10 cmH2O
Patient temperature: 37
RATE: 10 resp/min
pCO2 arterial: 42 mmHg (ref 32.0–48.0)
pH, Arterial: 7.38 (ref 7.350–7.450)
pO2, Arterial: 60 mmHg — ABNORMAL LOW (ref 83.0–108.0)

## 2020-10-25 LAB — CBC
HCT: 23.9 % — ABNORMAL LOW (ref 36.0–46.0)
Hemoglobin: 8.4 g/dL — ABNORMAL LOW (ref 12.0–15.0)
MCH: 25.8 pg — ABNORMAL LOW (ref 26.0–34.0)
MCHC: 35.1 g/dL (ref 30.0–36.0)
MCV: 73.5 fL — ABNORMAL LOW (ref 80.0–100.0)
Platelets: 304 10*3/uL (ref 150–400)
RBC: 3.25 MIL/uL — ABNORMAL LOW (ref 3.87–5.11)
RDW: 19.3 % — ABNORMAL HIGH (ref 11.5–15.5)
WBC: 22.2 10*3/uL — ABNORMAL HIGH (ref 4.0–10.5)
nRBC: 1.4 % — ABNORMAL HIGH (ref 0.0–0.2)

## 2020-10-25 LAB — PHOSPHORUS: Phosphorus: 3 mg/dL (ref 2.5–4.6)

## 2020-10-25 LAB — GLUCOSE, CAPILLARY
Glucose-Capillary: 110 mg/dL — ABNORMAL HIGH (ref 70–99)
Glucose-Capillary: 155 mg/dL — ABNORMAL HIGH (ref 70–99)
Glucose-Capillary: 155 mg/dL — ABNORMAL HIGH (ref 70–99)
Glucose-Capillary: 185 mg/dL — ABNORMAL HIGH (ref 70–99)
Glucose-Capillary: 215 mg/dL — ABNORMAL HIGH (ref 70–99)
Glucose-Capillary: 245 mg/dL — ABNORMAL HIGH (ref 70–99)

## 2020-10-25 LAB — AEROBIC CULTURE W GRAM STAIN (SUPERFICIAL SPECIMEN): Gram Stain: NONE SEEN

## 2020-10-25 LAB — TRIGLYCERIDES: Triglycerides: 124 mg/dL (ref <150)

## 2020-10-25 LAB — HEPARIN LEVEL (UNFRACTIONATED): Heparin Unfractionated: 0.26 IU/mL — ABNORMAL LOW (ref 0.30–0.70)

## 2020-10-25 LAB — MAGNESIUM: Magnesium: 2.1 mg/dL (ref 1.7–2.4)

## 2020-10-25 LAB — PREPARE RBC (CROSSMATCH)

## 2020-10-25 SURGERY — AMPUTATION BELOW KNEE
Anesthesia: General | Site: Leg Lower | Laterality: Right

## 2020-10-25 MED ORDER — MIDAZOLAM HCL 2 MG/2ML IJ SOLN
INTRAMUSCULAR | Status: AC
  Filled 2020-10-25: qty 2

## 2020-10-25 MED ORDER — SODIUM CHLORIDE 0.9 % IV SOLN
10.0000 mL/h | Freq: Once | INTRAVENOUS | Status: AC
  Administered 2020-10-25: 10 mL/h via INTRAVENOUS

## 2020-10-25 MED ORDER — BLISTEX MEDICATED EX OINT
TOPICAL_OINTMENT | CUTANEOUS | Status: DC | PRN
  Filled 2020-10-25: qty 6.3

## 2020-10-25 MED ORDER — FENTANYL 2500MCG IN NS 250ML (10MCG/ML) PREMIX INFUSION
0.0000 ug/h | INTRAVENOUS | Status: DC
  Administered 2020-10-25: 25 ug/h via INTRAVENOUS
  Administered 2020-10-26: 200 ug/h via INTRAVENOUS
  Filled 2020-10-25 (×2): qty 250

## 2020-10-25 MED ORDER — MIDAZOLAM HCL 2 MG/2ML IJ SOLN
0.5000 mg | INTRAMUSCULAR | Status: DC | PRN
  Administered 2020-10-25 (×2): 0.5 mg via INTRAVENOUS
  Filled 2020-10-25 (×2): qty 2

## 2020-10-25 MED ORDER — KETAMINE HCL 50 MG/ML IJ SOLN
INTRAMUSCULAR | Status: AC
  Filled 2020-10-25: qty 1

## 2020-10-25 MED ORDER — SODIUM CHLORIDE 0.9% IV SOLUTION: Freq: Once | INTRAVENOUS | Status: AC

## 2020-10-25 MED ORDER — KETAMINE HCL 50 MG/ML IJ SOLN
INTRAMUSCULAR | Status: DC | PRN
  Administered 2020-10-25: 50 mg via INTRAMUSCULAR

## 2020-10-25 MED ORDER — MIDAZOLAM HCL 2 MG/2ML IJ SOLN
INTRAMUSCULAR | Status: DC | PRN
  Administered 2020-10-25: 2 mg via INTRAVENOUS

## 2020-10-25 MED ORDER — HEPARIN BOLUS VIA INFUSION
1300.0000 [IU] | Freq: Once | INTRAVENOUS | Status: AC
  Administered 2020-10-25: 1300 [IU] via INTRAVENOUS
  Filled 2020-10-25: qty 1300

## 2020-10-25 MED ORDER — SODIUM CHLORIDE 0.9 % IV SOLN: INTRAVENOUS | Status: DC | PRN

## 2020-10-25 MED ORDER — ONDANSETRON HCL 4 MG/2ML IJ SOLN
INTRAMUSCULAR | Status: DC | PRN
  Administered 2020-10-25: 4 mg via INTRAVENOUS

## 2020-10-25 MED ORDER — ROCURONIUM BROMIDE 100 MG/10ML IV SOLN
INTRAVENOUS | Status: DC | PRN
  Administered 2020-10-25: 50 mg via INTRAVENOUS

## 2020-10-25 MED ORDER — INSULIN ASPART 100 UNIT/ML IJ SOLN
3.0000 [IU] | INTRAMUSCULAR | Status: DC
  Administered 2020-10-25 – 2020-10-26 (×4): 3 [IU] via SUBCUTANEOUS
  Filled 2020-10-25 (×4): qty 1

## 2020-10-25 MED ORDER — NOREPINEPHRINE 16 MG/250ML-% IV SOLN
0.0000 ug/min | INTRAVENOUS | Status: DC
  Administered 2020-10-25: 12 ug/min via INTRAVENOUS
  Administered 2020-10-25: 18 ug/min via INTRAVENOUS
  Administered 2020-10-26: 50 ug/min via INTRAVENOUS
  Filled 2020-10-25 (×5): qty 250

## 2020-10-25 SURGICAL SUPPLY — 45 items
APL PRP STRL LF DISP 70% ISPRP (MISCELLANEOUS) ×4
BLADE SAGITTAL WIDE XTHICK NO (BLADE) IMPLANT
BLADE SAW SAG 25.4X90 (BLADE) ×4 IMPLANT
BNDG CMPR STD VLCR NS LF 5.8X6 (GAUZE/BANDAGES/DRESSINGS)
BNDG COHESIVE 4X5 TAN STRL (GAUZE/BANDAGES/DRESSINGS) ×4 IMPLANT
BNDG ELASTIC 6X5.8 VLCR NS LF (GAUZE/BANDAGES/DRESSINGS) ×2 IMPLANT
BNDG GAUZE ELAST 4 BULKY (GAUZE/BANDAGES/DRESSINGS) ×4 IMPLANT
BRUSH SCRUB EZ  4% CHG (MISCELLANEOUS) ×2
BRUSH SCRUB EZ 4% CHG (MISCELLANEOUS) ×2 IMPLANT
CANISTER WOUND CARE 500ML ATS (WOUND CARE) ×2 IMPLANT
CHLORAPREP W/TINT 26 (MISCELLANEOUS) ×6 IMPLANT
COVER WAND RF STERILE (DRAPES) ×4 IMPLANT
DRAPE INCISE IOBAN 66X45 STRL (DRAPES) ×2 IMPLANT
DRSG VAC ATS MED SENSATRAC (GAUZE/BANDAGES/DRESSINGS) ×2 IMPLANT
ELECT CAUTERY BLADE 6.4 (BLADE) ×4 IMPLANT
ELECT REM PT RETURN 9FT ADLT (ELECTROSURGICAL) ×4
ELECTRODE REM PT RTRN 9FT ADLT (ELECTROSURGICAL) ×2 IMPLANT
GAUZE 4X4 16PLY ~~LOC~~+RFID DBL (SPONGE) ×2 IMPLANT
GAUZE XEROFORM 1X8 LF (GAUZE/BANDAGES/DRESSINGS) ×4 IMPLANT
GLOVE SURG ENC MOIS LTX SZ7 (GLOVE) ×4 IMPLANT
GLOVE SURG SYN 7.0 (GLOVE) ×4 IMPLANT
GLOVE SURG SYN 7.0 PF PI (GLOVE) ×2 IMPLANT
GLOVE SURG UNDER LTX SZ7.5 (GLOVE) ×4 IMPLANT
GOWN STRL REUS W/ TWL LRG LVL3 (GOWN DISPOSABLE) ×2 IMPLANT
GOWN STRL REUS W/ TWL XL LVL3 (GOWN DISPOSABLE) ×4 IMPLANT
GOWN STRL REUS W/TWL LRG LVL3 (GOWN DISPOSABLE) ×4
GOWN STRL REUS W/TWL XL LVL3 (GOWN DISPOSABLE) ×8
HANDLE YANKAUER SUCT BULB TIP (MISCELLANEOUS) ×4 IMPLANT
KIT TURNOVER KIT A (KITS) ×4 IMPLANT
LABEL OR SOLS (LABEL) ×4 IMPLANT
MANIFOLD NEPTUNE II (INSTRUMENTS) ×4 IMPLANT
NS IRRIG 1000ML POUR BTL (IV SOLUTION) ×4 IMPLANT
PACK EXTREMITY ARMC (MISCELLANEOUS) ×4 IMPLANT
PAD ABD DERMACEA PRESS 5X9 (GAUZE/BANDAGES/DRESSINGS) ×4 IMPLANT
PAD PREP 24X41 OB/GYN DISP (PERSONAL CARE ITEMS) ×4 IMPLANT
SPONGE T-LAP 18X18 ~~LOC~~+RFID (SPONGE) ×2 IMPLANT
STAPLER SKIN PROX 35W (STAPLE) ×2 IMPLANT
STOCKINETTE M/LG 89821 (MISCELLANEOUS) ×4 IMPLANT
SUT SILK 2 0 (SUTURE) ×4
SUT SILK 2 0 SH (SUTURE) ×8 IMPLANT
SUT SILK 2-0 18XBRD TIE 12 (SUTURE) ×2 IMPLANT
SUT SILK 3 0 (SUTURE) ×4
SUT SILK 3-0 18XBRD TIE 12 (SUTURE) ×2 IMPLANT
SUT VIC AB 0 CT1 36 (SUTURE) ×8 IMPLANT
SUT VIC AB 2-0 CT1 (SUTURE) ×8 IMPLANT

## 2020-10-25 NOTE — Anesthesia Preprocedure Evaluation (Addendum)
Anesthesia Evaluation  Patient identified by MRN, date of birth, ID band Patient awake    Reviewed: Allergy & Precautions, H&P , NPO status , Patient's Chart, lab work & pertinent test results, reviewed documented beta blocker date and time   History of Anesthesia Complications Negative for: history of anesthetic complications  Airway Mallampati: Intubated  TM Distance: >3 FB Neck ROM: limited    Dental  (+) Chipped   Pulmonary neg pulmonary ROS, neg shortness of breath, Patient abstained from smoking., former smoker,    Pulmonary exam normal  + decreased breath sounds      Cardiovascular Exercise Tolerance: Good hypertension, Pt. on medications (-) angina+ CAD, + Past MI, + Peripheral Vascular Disease and +CHF  (-) DOE negative cardio ROS Normal cardiovascular exam Rhythm:regular Rate:Tachycardia     Neuro/Psych PSYCHIATRIC DISORDERS Anxiety Depression  Neuromuscular disease negative neurological ROS  negative psych ROS   GI/Hepatic negative GI ROS, Neg liver ROS, GERD  Medicated and Controlled,  Endo/Other  negative endocrine ROSdiabetes, Type 2  Renal/GU Renal diseasenegative Renal ROS  negative genitourinary   Musculoskeletal  (+) Arthritis ,   Abdominal   Peds  Hematology  (+) Blood dyscrasia, anemia ,   Anesthesia Other Findings Past Medical History: No date: Anxiety No date: Atrial fibrillation (HCC) No date: Atrial flutter (HCC) No date: Coronary artery disease     Comment:  a. 06/2014 NSTEMI s/p CABG x 4 (LIMA to LAD, VG to Diag,               VG to OM, VG to PDA). No date: Depression No date: Diabetic neuropathy (HCC) No date: GERD (gastroesophageal reflux disease) No date: HTN (hypertension) No date: Hyperlipidemia LDL goal <70 No date: IDDM (insulin dependent diabetes mellitus) No date: Ischemic cardiomyopathy     Comment:  a. 06/2014 Echo: EF 40-45%, HK of entire inferolateral               and  inferior myocardium c/w infarct of RCA/LCx, GR2DD,               mild MR 2016: Myocardial infarction ([place]) No date: OA (osteoarthritis) of knee No date: Obesity No date: Osteomyelitis ([place]) No date: Peripheral neuropathy No date: Peripheral vascular disease ([place])     Comment:  a. 09/2016 Periph Angio: CTO R popliteal, CTO L prox/mid               SFA->Med Rx; b. 05/2017 PTA: LSFA (Viabahn covered stent x              2), DEB to L Post Tibial; c. 06/2017 ABI: R 0.44, L 1.05. No date: Tobacco abuse  Past Surgical History: 10/27/2016: ABDOMINAL AORTOGRAM W/LOWER EXTREMITY; N/A     Comment:  Procedure: Abdominal Aortogram w/Lower Extremity;                Surgeon: Nelva Bush, MD;  Location: [place] [facility]               [facility];  Service: Cardiovascular;  Laterality: N/A; 10/21/2015: AMPUTATION TOE; Left     Comment:  Procedure: AMPUTATION TOE;  Surgeon: Sharlotte Alamo, DPM;                Location: [facility] OR[facility];  Service: Podiatry;  Laterality:               Left; 05/15/2017: AMPUTATION TOE; Left     Comment:  Procedure: AMPUTATION LEFT GREAT TOE;  Surgeon: Cleda Mccreedy,  Sherren Mocha, DPM;  Location: [facility] OR[facility];  Service: Podiatry;                Laterality: Left; No date: AORTIC VALVE REPLACEMENT (AVR)/CORONARY ARTERY BYPASS  GRAFTING (CABG) No date: BREAST BIOPSY 06/2014: CARDIAC CATHETERIZATION     Comment:  95% stenosis mLAD, occlusion ostial OM1, 70% stenosis               LCx, 95% stenosis mRCA, EF 45%. 04/14/2020: CARDIOVERSION; N/A     Comment:  Procedure: CARDIOVERSION with TEE;  Surgeon: Minna Merritts, MD;  Location: [place] [facility];  Service:               Cardiovascular;  Laterality: N/A; 05/28/2020: CARDIOVERSION; N/A     Comment:  Procedure: CARDIOVERSION;  Surgeon: Minna Merritts,               MD;  Location: [facility]S;  Service: Cardiovascular;                Laterality: N/A; 06/08/2014: CORONARY ARTERY BYPASS GRAFT; N/A     Comment:  Procedure: CORONARY ARTERY BYPASS  GRAFTING (CABG);                Surgeon: Grace Isaac, MD;  Location: [place];                Service: Open Heart Surgery;  Laterality: N/A;  Times 4               using left internal mammary artery to LAD artery and               endoscopically harvested bilateral saphenous vein to               Obtuse Marginal, Diagonal and Posterior Descending               coronary arteries. 06/2014: CT ABD W & PELVIS WO CM     Comment:  nl liver, gallbladder, spleen, mild diverticular               changes, no bowel wall inflammation, appendix nl, no               hernia, no other sig abnormalities 02/09/2020: GASTRIC ROUX-EN-Y; N/A     Comment:  Procedure: LAPAROSCOPIC ROUX-EN-Y GASTRIC BYPASS WITH               UPPER ENDOSCOPY;  Surgeon: Johnathan Hausen, MD;                Location: [facility];  Service: General;  Laterality: N/A; 05/23/2017: LOWER EXTREMITY ANGIOGRAPHY; Left     Comment:  Procedure: LOWER EXTREMITY ANGIOGRAPHY;  Surgeon: Algernon Huxley, MD;  Location: [place] [facility];  Service:               Cardiovascular;  Laterality: Left; 05/28/2017: LOWER EXTREMITY ANGIOGRAPHY; Left     Comment:  Procedure: LOWER EXTREMITY ANGIOGRAPHY;  Surgeon: Algernon Huxley, MD;  Location: [place] [facility];  Service:               Cardiovascular;  Laterality: Left; 12/16/2018: LOWER EXTREMITY ANGIOGRAPHY; Left     Comment:  Procedure: LOWER EXTREMITY ANGIOGRAPHY;  Surgeon: Lucky Cowboy,  Erskine Squibb, MD;  Location: [place] [facility];  Service:               Cardiovascular;  Laterality: Left; 12/23/2018: LOWER EXTREMITY ANGIOGRAPHY; Right     Comment:  Procedure: LOWER EXTREMITY ANGIOGRAPHY;  Surgeon: Algernon Huxley, MD;  Location: [place] [facility];  Service:               Cardiovascular;  Laterality: Right; 10/08/2020: LOWER EXTREMITY ANGIOGRAPHY; Right     Comment:  Procedure: Lower Extremity Angiography;  Surgeon: Algernon Huxley, MD;   Location: [place] [facility];  Service:               Cardiovascular;  Laterality: Right; 06/08/2014: TEE WITHOUT CARDIOVERSION; N/A     Comment:  Procedure: TRANSESOPHAGEAL ECHOCARDIOGRAM (TEE);                Surgeon: Grace Isaac, MD;  Location: [place];                Service: Open Heart Surgery;  Laterality: N/A; 04/14/2020: TEE WITHOUT CARDIOVERSION; N/A     Comment:  Procedure: TRANSESOPHAGEAL ECHOCARDIOGRAM (TEE);                Surgeon: Minna Merritts, MD;  Location: [facility];                Service: Cardiovascular;  Laterality: N/A; 12/2011: TOE AMPUTATION; Right     Comment:  rt middle toe 02/09/2020: UPPER GI ENDOSCOPY; N/A     Comment:  Procedure: UPPER GI ENDOSCOPY;  Surgeon: Johnathan Hausen, MD;  Location: [facility];  Service: General;                Laterality: N/A; 06/2014: US ECHOCARDIOGRAPHY     Comment:  EF 50-55%, HK of inf/post/inferolat walls, Ao sclerosis  BMI    Body Mass Index: 37.10 kg/m      Reproductive/Obstetrics negative OB ROS                            Anesthesia Physical Anesthesia Plan  ASA: 4  Anesthesia Plan: General ETT and General   Post-op Pain Management:    Induction: Intravenous  PONV Risk Score and Plan: 4 or greater and Ondansetron, Dexamethasone, Midazolam and Treatment may vary due to age or medical condition  Airway Management Planned: Oral ETT  Additional Equipment:   Intra-op Plan:   Post-operative Plan: Post-operative intubation/ventilation  Informed Consent: I have reviewed the patients History and Physical, chart, labs and discussed the procedure including the risks, benefits and alternatives for the proposed anesthesia with the patient or authorized representative who has indicated his/her understanding and acceptance.     Dental Advisory Given  Plan Discussed with: Anesthesiologist, CRNA and Surgeon  Anesthesia Plan Comments: (Sister consented for risks of anesthesia  including but not limited to:  - adverse reactions to medications - damage to eyes, teeth, lips or other oral mucosa - nerve damage due to positioning  - sore throat or hoarseness - Damage to heart, brain, nerves, lungs, other parts of body or loss of life...per Dr. Theodore Demark  As reviewed, pt is extremely high risk for any anesthetic procedure.  Family is aware. Dr. Andree Elk  She voiced understanding.)      Anesthesia Quick Evaluation

## 2020-10-25 NOTE — Transfer of Care (Signed)
Immediate Anesthesia Transfer of Care Note  Patient: Melinda [name]  Procedure(s) Performed: AMPUTATION BELOW KNEE (Right: Knee) MINOR APPLICATION OF WOUND VAC (Right: Leg Lower)  Patient Location: ICU  Anesthesia Type:General  Level of Consciousness: sedated and Patient remains intubated per anesthesia plan  Airway & Oxygen Therapy: Patient remains intubated per anesthesia plan and Patient placed on Ventilator (see vital sign flow sheet for setting)  Post-op Assessment: Report given to RN and Post -op Vital signs reviewed and stable  Post vital signs: stable  Last Vitals:  Vitals Value Taken Time  BP 138/57   Temp    Pulse 100   Resp 12   SpO2 80     Last Pain:  Vitals:   10/03/2020 1530  TempSrc: Esophageal  PainSc:          Complications: No notable events documented.

## 2020-10-25 NOTE — Progress Notes (Signed)
GOALS OF CARE DISCUSSION  The Clinical status was relayed to family in detail. Sister at bedside Updated and notified of patients medical condition.    Patient remains unresponsive and will not open eyes to command.   Patient is having a weak cough and struggling to remove secretions.   Patient with increased WOB and using accessory muscles to breathe Explained to family course of therapy and the modalities  Multiorgan failure RT foot gangrene vascular surgery to assess amputation Kidney function is worsening Nephrology consulted  Patient with Progressive multiorgan failure with a very high probablity of a very minimal chance of meaningful recovery despite all aggressive and optimal medical therapy.  PATIENT REMAINS FULL CODE  Family understands the situation. I have urged family to discuss DNR status with family   Family are satisfied with Plan of action and management. All questions answered  Additional CC time 35 mins   Shevy Yaney Patricia Pesa, M.D.  [facility]  Medical Director [place] Director [facility] Cardio-Pulmonary Department

## 2020-10-25 NOTE — Progress Notes (Signed)
Progress Note  Patient Name: Melinda [name] Date of Encounter: 10/02/2020  Primary Cardiologist: [place] intubated. Febrile up to 100.6. Renal function declining. Potassium repleted. BP stable.   Inpatient Medications    Scheduled Meds:  acetaminophen  650 mg Per Tube Q4H   Or   acetaminophen (TYLENOL) oral liquid 160 mg/5 mL  650 mg Per Tube Q4H   Or   acetaminophen  650 mg Rectal Q4H   aspirin  81 mg Per Tube Daily   atorvastatin  80 mg Per Tube QHS   chlorhexidine  60 mL Topical Once   chlorhexidine gluconate (MEDLINE KIT)  15 mL Mouth Rinse BID   Chlorhexidine Gluconate Cloth  6 each Topical Daily   cholecalciferol  2,000 Units Per Tube Daily   clopidogrel  75 mg Per Tube Daily   docusate  100 mg Per Tube BID   feeding supplement (PIVOT 1.5 CAL)  1,000 mL Per Tube Q24H   feeding supplement (PROSource TF)  45 mL Per Tube QID   free water  30 mL Per Tube Q4H   insulin aspart  0-15 Units Subcutaneous Q4H   insulin glargine  6 Units Subcutaneous QHS   ipratropium-albuterol  3 mL Nebulization Q4H   mouth rinse  15 mL Mouth Rinse 10 times per day   multivitamin with minerals  1 tablet Per Tube Daily   nutrition supplement (JUVEN)  1 packet Per Tube BID BM   oxybutynin  5 mg Per Tube BID   pantoprazole (PROTONIX) IV  40 mg Intravenous QHS   polyethylene glycol  17 g Per Tube Daily   povidone-iodine  2 application Topical Once   pregabalin  100 mg Per Tube TID   Continuous Infusions:  amiodarone 30 mg/hr (10/10/2020 0658)   epinephrine Stopped (10/23/2020 0911)   heparin 800 Units/hr (10/07/2020 0658)   norepinephrine (LEVOPHED) Adult infusion 20 mcg/min (10/20/2020 0713)   piperacillin-tazobactam (ZOSYN)  IV 3.375 g (10/11/2020 0625)   propofol (DIPRIVAN) infusion 25 mcg/kg/min (10/15/2020 0544)   sodium chloride     PRN Meds: acetaminophen **OR** acetaminophen, HYDROmorphone (DILAUDID) injection, povidone-Iodine   Vital Signs    Vitals:    10/24/20 1918 10/24/20 2317 10/13/2020 0500 10/16/2020 0600  BP:      Pulse:      Resp:   16 20  Temp:   100.2 F (37.9 C) (!) 100.6 F (38.1 C)  TempSrc:      SpO2: 97% 100%    Weight:      Height:        Intake/Output Summary (Last 24 hours) at 09/29/2020 0756 Last data filed at 10/05/2020 0713 Gross per 24 hour  Intake 1771.14 ml  Output 1575 ml  Net 196.14 ml   Filed Weights   10/17/2020 1416 10/08/2020 0801  Weight: 110.7 kg 110.7 kg    Telemetry    SR - Personally Reviewed  ECG    No new tracings - Personally Reviewed  Physical Exam   GEN: No acute distress, ill appearing.   Neck: No JVD. Cardiac: RRR, I/VI systolic murmur, no rubs, or gallops.  Respiratory: Vented breath sounds bilaterally.  GI: Soft, nontender, non-distended.   MS: No edema; No deformity. Neuro:  Intubated.  Psych: Intubated.  Labs    Chemistry Recent Labs  Lab 10/02/2020 1419 10/20/20 0930 10/23/20 0330 10/24/20 0430 10/15/2020 0452  NA 138   < > 140 139 138  K 2.5*   < > 3.3*  3.4* 4.0  CL 98   < > 104 105 104  CO2 31   < > _0 GLUCOSE 128*   < > 198* 175* 247*  BUN 11   < > 27* 42* 47*  CREATININE 1.13*   < > 2.07* 2.58* 2.97*  CALCIUM 7.7*   < > 7.3* 7.1* 7.1*  PROT 6.1*  --   --   --   --   ALBUMIN 2.1*  --   --   --   --   AST 19  --   --   --   --   ALT 17  --   --   --   --   ALKPHOS 140*  --   --   --   --   BILITOT 1.5*  --   --   --   --   GFRNONAA 53*   < > 26* 20* 17*  ANIONGAP 9   < > _1 < > = values in this interval not displayed.     Hematology Recent Labs  Lab 10/23/20 0330 10/24/20 0430 10/20/2020 0452  WBC 19.6* 21.5* 22.2*  RBC 3.35* 3.31* 3.25*  HGB 8.8* 8.4* 8.4*  HCT 24.4* 24.5* 23.9*  MCV 72.8* 74.0* 73.5*  MCH 26.3 25.4* 25.8*  MCHC 36.1* 34.3 35.1  RDW 19.2* 18.8* 19.3*  PLT 339 328 304    Cardiac EnzymesNo results for input(s): TROPONINI in the last 168 hours. No results for input(s): TROPIPOC in the last 168 hours.   BNPNo  results for input(s): BNP, PROBNP in the last 168 hours.   DDimer No results for input(s): DDIMER in the last 168 hours.   Radiology    No results found.  Cardiac Studies   2D echo 10/12/2020:  1. Left ventricular ejection fraction, by estimation, is 45%. The left  ventricle has mild to moderately decreased function. The left ventricle  demonstrates global hypokinesis. There is moderate left ventricular  hypertrophy. Left ventricular diastolic  parameters are consistent with Grade II diastolic dysfunction  (pseudonormalization). Elevated left atrial pressure. The average left  ventricular global longitudinal strain is -12.2 %. The global longitudinal  strain is abnormal.   2. Pulmonary artery pressure is at least mildly to moderately elevated  (RVSP 35-40 mmHg plus central venous pressure). Right ventricular systolic  function is moderately reduced. The right ventricular size is mildly  enlarged.   3. Left atrial size was moderately dilated.   4. Right atrial size was mildly dilated.   5. The mitral valve is normal in structure. Mild mitral valve  regurgitation. No evidence of mitral stenosis.   6. Tricuspid valve regurgitation is mild to moderate.   7. The aortic valve has an indeterminant number of cusps. There is mild  calcification of the aortic valve. There is mild thickening of the aortic  valve. Aortic valve regurgitation is not visualized. Mild aortic valve  sclerosis is present, with no  evidence of aortic valve stenosis. __________   2D echo 08/12/2020: 1. Left ventricular ejection fraction, by estimation, is 30 to 35%. The  left ventricle has moderately decreased function. The left ventricle  demonstrates global hypokinesis. Left ventricular diastolic parameters are  indeterminate. The average left  ventricular global longitudinal strain is -12.5 %. The global longitudinal  strain is abnormal.   2. Right ventricular systolic function is normal. The right ventricular   size is normal. There is normal pulmonary artery systolic pressure.  3. Left atrial size was moderately dilated.   4. The mitral valve is normal in structure. Mild mitral valve  regurgitation. __________   TEE 04/14/2020: 1. Left ventricular ejection fraction, by estimation, is 50 to 55%. The  left ventricle has low normal function. The left ventricle has no regional  wall motion abnormalities.   2. Right ventricular systolic function is normal. The right ventricular  size is normal.   3. No left atrial/left atrial appendage thrombus was detected.   4. The mitral valve is normal in structure. Mild to moderate mitral valve  regurgitation. No evidence of mitral stenosis.   5. The aortic valve is normal in structure. Aortic valve regurgitation is  not visualized. No aortic stenosis is present.   6. There is mild (Grade II) plaque involving the transverse aorta and  descending aorta.   7. The inferior vena cava is normal in size with greater than 50%  respiratory variability, suggesting right atrial pressure of 3 mmHg.   8. Agitated saline contrast bubble study was negative, with no evidence  of any interatrial shunt.   Conclusion(s)/Recommendation(s): Normal biventricular function without  evidence of hemodynamically significant valvular heart disease. __________   2D echo 03/02/2020:  1. Left ventricular ejection fraction, by estimation, is 35 to 40%. The  left ventricle has moderately decreased function. The left ventricle  demonstrates regional wall motion abnormalities (see scoring  diagram/findings for description). Left ventricular   diastolic parameters are consistent with Grade I diastolic dysfunction  (impaired relaxation). There is moderate hypokinesis of the left  ventricular, basal-mid inferior wall and inferolateral wall.   2. Right ventricular systolic function is mildly reduced. The right  ventricular size is normal. Tricuspid regurgitation signal is inadequate   for assessing PA pressure.   3. Left atrial size was mildly dilated.   4. Right atrial size was mildly dilated.   5. The mitral valve is normal in structure. Trivial mitral valve  regurgitation. No evidence of mitral stenosis. Moderate mitral annular  calcification.   6. The aortic valve is tricuspid. There is mild calcification of the  aortic valve. There is mild thickening of the aortic valve. Aortic valve  regurgitation is not visualized. Mild to moderate aortic valve  sclerosis/calcification is present, without any  evidence of aortic stenosis.   7. The inferior vena cava is normal in size with <50% respiratory  variability, suggesting right atrial pressure of 8 mmHg. __________   2D echo 06/04/2014: - Left ventricle: The cavity size was normal. There was moderate    concentric hypertrophy. Systolic function was mildly to    moderately reduced. The estimated ejection fraction was in the    range of 40% to 45%. Severe hypokinesis of the    entireinferolateral and inferior myocardium; consistent with    infarction in the distribution of the right coronary or left    circumflex coronary artery. Features are consistent with a    pseudonormal left ventricular filling pattern, with concomitant    abnormal relaxation and increased filling pressure (grade 2    diastolic dysfunction).  - Mitral valve: There was mild regurgitation.  Patient Profile     65 y.o. 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  Assessment & Plan    1. Cardiac arrest/WCT: -No further episodes -IV amiodarone  -Plan for cardiac cath when respiratory and renal function improve -Maintain potassium and magnesium at goal 4.0 and 2.0, respectively   2. CAD involving the native coronary arteries s/p CABG with elevated HS-Tn: -Minimal troponin elevation peaking at 53, not consistent with ACS and likely supply demand ischemia in the setting of the above in combination with her acute illness and comorbid conditions  -ASA, Plavix, Lipitor -Will need cath when able as above -Initiate beta blocker when able  3. PAF: -Noted after ROSC -Maintaining sinus rhythm -Amiodarone -High risk for recurrent arrhythmia  -Heparin gtt  4. HFrEF secondary to ICM: -Echo this admissions showed a slightly improved LVSF as above -Resume GDMT as able, no longer requiring vasopressor support -Plan for LHC when able as above  5. PAD: -s/p mechanical thrombectomy, angioplasty of right popliteal artery -ASA, Plavix, Lipitor -Per vascular surgery  6. Acute hypoxic respiratory failure: -Intubated -CCM  7. Hypokalemia: -Repleted  8. AKI: -Renal function continues to decline -Per primary service   9. Anemia: -Stable  For questions or updates, please contact [place] Please consult [email or web address] for contact info under Cardiology/STEMI.    Signed, Christell Faith, [name] [place] Pager: [phone] 10/20/2020, 7:56 AM

## 2020-10-25 NOTE — Op Note (Signed)
   OPERATIVE NOTE   PROCEDURE: Right below-the-knee amputation, open, just above the ankle  PRE-OPERATIVE DIAGNOSIS: Right foot gangrene with sepsis  POST-OPERATIVE DIAGNOSIS: same as above  SURGEON: [name], MD  ASSISTANT(S): none  ANESTHESIA: general  ESTIMATED BLOOD LOSS: 25 cc  FINDING(S): None  SPECIMEN(S):  Right below-the-knee amputation  INDICATIONS:   Melinda [name] is a 65 y.o. female who presents with right leg gangrene.  The patient is scheduled for a right below-the-knee amputation but has sepsis with multisystem organ failure and needs an urgent open amputation to clear the infection before a formal amputation.  I discussed in depth with the patient the risks, benefits, and alternatives to this procedure.  The patient is aware that the risk of this operation included but are not limited to:  bleeding, infection, myocardial infarction, stroke, death, failure to heal amputation wound, and possible need for more proximal amputation.  The patient is aware of the risks and agrees proceed forward with the procedure.  DESCRIPTION:  After full informed written consent was obtained from the patient, the patient was brought back to the operating room, and placed supine upon the operating table.  Prior to induction, the patient received IV antibiotics.  The patient was then prepped and draped in the standard fashion for a below-the-knee amputation.  After obtaining adequate anesthesia, the patient was prepped and draped in the standard fashion for a right below-the-knee amputation.  A circular incision was then made a few centimeters above the ankle.  Using the knife I dissected down to the deep tissues where combination of electrocautery and scalpel were used to get down to the bone.  The power saw was then used to go through the tibia and fibula and then the posterior area was completed with a scalpel.  Several visible vessels were then controlled with hemostats and ligated  with either silk ties or silk suture ligatures.  Once hemostasis was achieved, VAC sponge was cut to fit the wound.  Strips of [place] were used and occlusive seal was obtained.  The patient was taken to ICU in critical but stable condition.  COMPLICATIONS: none  CONDITION: stable   [name]  10/02/2020, 6:03 PM    This note was created with Dragon Medical transcription system. Any errors in dictation are purely unintentional.

## 2020-10-25 NOTE — Consult Note (Signed)
[facility]  CONSULT NOTE    Date: 10/06/2020                  Patient Name:  Melinda [name]  MRN: [id number]  DOB: [birth year]  Age / Sex: 65 y.o., female         PCP: Donnie Coffin, MD                 Service Requesting Consult: Critical care                 Reason for Consult: Acute Kidney Injury            History of Present Illness: Melinda [name] is a 65 y.o.  female with A fib/A flutter, CHF, diabetes, GERD, hypertension, PVD and hyperlipidemia, who was admitted to [facility] on 09/30/2020 for Wound infection [T14.8XXA, L08.9] Sepsis without acute organ dysfunction, due to unspecified organism [facility]) [A41.9] Acute osteomyelitis of calcaneum, right ([place]) [L57.262]   Patient stated at admission that the wound began to peel and she removed the skin about 3-4 weeks ago. She is currently being seen by wound management for a toe wound.   Patient is currently sedated and intubated.  Drips include: Amiodarone, Fentanyl, and Norepinephrine  Foley in place   Medications: Outpatient medications: Medications Prior to Admission  Medication Sig Dispense Refill Last Dose   amiodarone (PACERONE) 200 MG tablet Take 1 tablet (200 mg total) by mouth 2 (two) times daily. 60 tablet 0 10/25/2020 at 1000   amLODipine (NORVASC) 5 MG tablet Take 5 mg by mouth daily.   10/20/2020 at 1000   apixaban (ELIQUIS) 5 MG TABS tablet Take 1 tablet (5 mg total) by mouth 2 (two) times daily. 180 tablet 1 10/05/2020 at 1000   atorvastatin (LIPITOR) 80 MG tablet Take 1 tablet (80 mg total) by mouth daily at 6 PM. 30 tablet 0 10/18/2020 at 1800   Cholecalciferol (VITAMIN D3) 50 MCG (2000 UT) TABS Take 2,000 Units by mouth daily.      clotrimazole (LOTRIMIN) 1 % cream Apply 1 application topically 2 (two) times daily as needed.   Unknown at PRN   docusate sodium (COLACE) 100 MG capsule Take 100 mg by mouth 2 (two) times daily.      DULoxetine (CYMBALTA) 60 MG capsule Take 1 capsule  (60 mg total) by mouth daily. 30 capsule 0 10/27/2020 at 1000   Emollient (CERAVE EX) Apply 1 application topically 2 (two) times daily as needed (dry skin).      furosemide (LASIX) 20 MG tablet Take 2 tablets (40 mg total) by mouth daily as needed (fluid retention.). 30 tablet 0 Unknown at PRN   insulin aspart (NOVOLOG) 100 UNIT/ML injection Inject 4 Units into the skin 3 (three) times daily with meals. 10 mL 11 10/23/2020 at 1000   metoprolol tartrate (LOPRESSOR) 50 MG tablet TAKE 1 TABLET TWICE DAILY (Patient taking differently: Take 50 mg by mouth 2 (two) times daily.) 60 tablet 0 10/14/2020 at 1000   oxybutynin (DITROPAN) 5 MG tablet Take 5 mg by mouth 2 (two) times daily.    10/06/2020 at 1000   pantoprazole (PROTONIX) 40 MG tablet Take 1 tablet (40 mg total) by mouth daily. 30 tablet 0 10/14/2020 at 1000   potassium chloride SA (KLOR-CON) 20 MEQ tablet TAKE 1 TABLET (20 MEQ TOTAL) BY MOUTH DAILY AS NEEDED. (Patient taking differently: Take 20 mEq by mouth daily as needed (potassium replacement with furosemide).) 30 tablet  0 Unknown at PRN   pregabalin (LYRICA) 100 MG capsule Take 1 capsule (100 mg total) by mouth 3 (three) times daily. 90 capsule 0 10/24/2020 at 1000   Semaglutide,0.25 or 0.5MG/DOS, (OZEMPIC, 0.25 OR 0.5 MG/DOSE,) 2 MG/1.5ML SOPN Inject 0.5 mg into the skin every Friday.   10/15/2020 at Unknown   tiZANidine (ZANAFLEX) 4 MG capsule Take 1 capsule (4 mg total) by mouth 3 (three) times daily as needed for muscle spasms. 30 capsule 0 Unknown at PRN   diltiazem (CARDIZEM) 60 MG tablet Take 1 tablet (60 mg total) by mouth 2 (two) times daily. (Patient not taking: No sig reported) 60 tablet 0 Not Taking   insulin glargine (LANTUS) 100 UNIT/ML injection Inject 0.06 mLs (6 Units total) into the skin at bedtime. (Patient not taking: No sig reported) 10 mL 0 Not Taking    Current medications: Current Facility-Administered Medications  Medication Dose Route Frequency Provider Last Rate Last  Admin   acetaminophen (TYLENOL) tablet 650 mg  650 mg Per Tube Q4H Beers, Shanon Brow, RPH   650 mg at 10/24/20 2036   Or   acetaminophen (TYLENOL) 160 MG/5ML solution 650 mg  650 mg Per Tube Q4H BeersShanon Brow, RPH   650 mg at 10/11/2020 3790   Or   acetaminophen (TYLENOL) suppository 650 mg  650 mg Rectal Q4H Beers, Shanon Brow, RPH       acetaminophen (TYLENOL) tablet 650 mg  650 mg Per Tube Q6H PRN Lorna Dibble, RPH   650 mg at 10/15/2020 2409   Or   acetaminophen (TYLENOL) suppository 650 mg  650 mg Rectal Q6H PRN Lorna Dibble, RPH       amiodarone (NEXTERONE PREMIX) 360-4.14 MG/200ML-% (1.8 mg/mL) IV infusion  30 mg/hr Intravenous Continuous Margaretha Seeds, MD 16.67 mL/hr at 10/12/2020 1107 30 mg/hr at 10/08/2020 1107   aspirin chewable tablet 81 mg  81 mg Per Tube Daily Lorna Dibble, RPH   81 mg at 09/29/2020 0828   atorvastatin (LIPITOR) tablet 80 mg  80 mg Per Tube QHS Nelle Don, MD   80 mg at 10/24/20 2229   chlorhexidine (HIBICLENS) 4 % liquid 4 application  60 mL Topical Once Samara Deist, DPM       chlorhexidine gluconate (MEDLINE KIT) (PERIDEX) 0.12 % solution 15 mL  15 mL Mouth Rinse BID Nelle Don, MD   15 mL at 10/10/2020 0730   Chlorhexidine Gluconate Cloth 2 % PADS 6 each  6 each Topical Daily Margaretha Seeds, MD   6 each at 10/24/20 [phone]   cholecalciferol (VITAMIN D3) tablet 2,000 Units  2,000 Units Per Tube Daily Lorna Dibble, RPH   2,000 Units at 10/05/2020 2992   clopidogrel (PLAVIX) tablet 75 mg  75 mg Per Tube Daily Lorna Dibble, RPH   75 mg at 10/24/20 0917   docusate (COLACE) 50 MG/5ML liquid 100 mg  100 mg Per Tube BID Margaretha Seeds, MD   100 mg at 10/02/2020 0826   feeding supplement (PIVOT 1.5 CAL) liquid 1,000 mL  1,000 mL Per Tube Q24H Nelle Don, MD 40 mL/hr at 10/12/2020 1036 1,000 mL at 09/30/2020 1036   feeding supplement (PROSource TF) liquid 45 mL  45 mL Per Tube QID Nelle Don, MD   45 mL at 10/28/2020 1044   fentaNYL 25105mg in  NS 2594m(1064mml) infusion-PREMIX  0-400 mcg/hr Intravenous Continuous GruDallie PilesPH 2.5 mL/hr at 10/18/2020 1101 25 mcg/hr at 10/14/2020 1101  free water 30 mL  30 mL Per Tube Q4H Nelle Don, MD   30 mL at 10/07/2020 0829   heparin ADULT infusion 100 units/mL (25000 units/278m)  800 Units/hr Intravenous Continuous KFlora Lipps MD   Stopped at 10/13/2020 0825   HYDROmorphone (DILAUDID) injection 0.5 mg  0.5 mg Intravenous Q2H PRN BNelle Don MD   0.5 mg at 10/24/20 1556   insulin aspart (novoLOG) injection 0-15 Units  0-15 Units Subcutaneous Q4H KDarel HongD, NP   5 Units at 10/23/2020 1229   insulin aspart (novoLOG) injection 3 Units  3 Units Subcutaneous Q4H GDallie Piles RPH       insulin glargine (LANTUS) injection 6 Units  6 Units Subcutaneous QHS DAlgernon Huxley MD   6 Units at 10/24/20 2229   ipratropium-albuterol (DUONEB) 0.5-2.5 (3) MG/3ML nebulizer solution 3 mL  3 mL Nebulization Q4H BNelle Don MD   3 mL at 10/11/2020 1052   MEDLINE mouth rinse  15 mL Mouth Rinse 10 times per day BNelle Don MD   15 mL at 10/09/2020 1230   midazolam (VERSED) injection 0.5 mg  0.5 mg Intravenous Q2H PRN GDallie Piles RPH   0.5 mg at 10/08/2020 1251   multivitamin with minerals tablet 1 tablet  1 tablet Per Tube Daily BNelle Don MD   1 tablet at 10/12/2020 0827   norepinephrine (LEVOPHED) 16 mg in 256mpremix infusion  0-40 mcg/min Intravenous Titrated OuLang SnowNP 16.88 mL/hr at 10/04/2020 0953 18 mcg/min at 10/20/2020 090347 nutrition supplement (JUVEN) (JUVEN) powder packet 1 packet  1 packet Per Tube BID BM BrNelle DonMD   1 packet at 10/23/2020 1044   pantoprazole (PROTONIX) injection 40 mg  40 mg Intravenous QHS ElMargaretha SeedsMD   40 mg at 10/24/20 2229   piperacillin-tazobactam (ZOSYN) IVPB 3.375 g  3.375 g Intravenous Q8H Rauer, Samantha O, RPH 12.5 mL/hr at 10/09/2020 0953 Infusion Verify at 10/28/2020 0953   polyethylene glycol (MIRALAX / GLYCOLAX)  packet 17 g  17 g Per Tube Daily ElMargaretha SeedsMD   17 g at 10/02/2020 0827   povidone-Iodine (BETADINE) 5 % topical solution   Topical PRN FoSamara DeistDPM   Given at 10/23/2020 1746   povidone-iodine 10 % swab 2 application  2 application Topical Once FoSamara DeistDPM       sodium chloride 0.9 % bolus 500 mL  500 mL Intravenous Once ElMargaretha SeedsMD          Allergies: No Known Allergies    Past Medical History: Past Medical History:  Diagnosis Date   Anxiety    Atrial fibrillation ([facility]   Atrial flutter ([facility]   Coronary artery disease    a. 06/2014 NSTEMI s/p CABG x 4 (LIMA to LAD, VG to Diag, VG to OM, VG to PDA).   Depression    Diabetic neuropathy (HCC)    GERD (gastroesophageal reflux disease)    HTN (hypertension)    Hyperlipidemia LDL goal <70    IDDM (insulin dependent diabetes mellitus)    Ischemic cardiomyopathy    a. 06/2014 Echo: EF 40-45%, HK of entire inferolateral and inferior myocardium c/w infarct of RCA/LCx, GR2DD, mild MR   Myocardial infarction ([facility] [place]   OA (osteoarthritis) of knee    Obesity    Osteomyelitis ([facility]   Peripheral neuropathy    Peripheral vascular disease ([facility]   a. 09/2016 Periph Angio: CTO R popliteal, CTO  L prox/mid SFA->Med Rx; b. 05/2017 PTA: LSFA (Viabahn covered stent x 2), DEB to L Post Tibial; c. 06/2017 ABI: R 0.44, L 1.05.   Tobacco abuse      Past Surgical History: Past Surgical History:  Procedure Laterality Date   ABDOMINAL AORTOGRAM W/LOWER EXTREMITY N/A 10/27/2016   Procedure: Abdominal Aortogram w/Lower Extremity;  Surgeon: Nelva Bush, MD;  Location: [place];  Service: Cardiovascular;  Laterality: N/A;   AMPUTATION TOE Left 10/21/2015   Procedure: AMPUTATION TOE;  Surgeon: Sharlotte Alamo, DPM;  Location: [facility];  Service: Podiatry;  Laterality: Left;   AMPUTATION TOE Left 05/15/2017   Procedure: AMPUTATION LEFT GREAT TOE;  Surgeon: Sharlotte Alamo, DPM;  Location: [facility];  Service: Podiatry;   Laterality: Left;   AORTIC VALVE REPLACEMENT (AVR)/CORONARY ARTERY BYPASS GRAFTING (CABG)     BREAST BIOPSY     CARDIAC CATHETERIZATION  06/2014   95% stenosis mLAD, occlusion ostial OM1, 70% stenosis LCx, 95% stenosis mRCA, EF 45%.   CARDIOVERSION N/A 04/14/2020   Procedure: CARDIOVERSION with TEE;  Surgeon: Minna Merritts, MD;  Location: [facility];  Service: Cardiovascular;  Laterality: N/A;   CARDIOVERSION N/A 05/28/2020   Procedure: CARDIOVERSION;  Surgeon: Minna Merritts, MD;  Location: [facility];  Service: Cardiovascular;  Laterality: N/A;   CORONARY ARTERY BYPASS GRAFT N/A 06/08/2014   Procedure: CORONARY ARTERY BYPASS GRAFTING (CABG);  Surgeon: Grace Isaac, MD;  Location: [place];  Service: Open Heart Surgery;  Laterality: N/A;  Times 4 using left internal mammary artery to LAD artery and endoscopically harvested bilateral saphenous vein to Obtuse Marginal, Diagonal and Posterior Descending coronary arteries.   CT ABD W & PELVIS WO CM  06/2014   nl liver, gallbladder, spleen, mild diverticular changes, no bowel wall inflammation, appendix nl, no hernia, no other sig abnormalities   GASTRIC ROUX-EN-Y N/A 02/09/2020   Procedure: LAPAROSCOPIC ROUX-EN-Y GASTRIC BYPASS WITH UPPER ENDOSCOPY;  Surgeon: Johnathan Hausen, MD;  Location: [facility];  Service: General;  Laterality: N/A;   LOWER EXTREMITY ANGIOGRAPHY Left 05/23/2017   Procedure: LOWER EXTREMITY ANGIOGRAPHY;  Surgeon: Algernon Huxley, MD;  Location: [place];  Service: Cardiovascular;  Laterality: Left;   LOWER EXTREMITY ANGIOGRAPHY Left 05/28/2017   Procedure: LOWER EXTREMITY ANGIOGRAPHY;  Surgeon: Algernon Huxley, MD;  Location: [place] [facility];  Service: Cardiovascular;  Laterality: Left;   LOWER EXTREMITY ANGIOGRAPHY Left 12/16/2018   Procedure: LOWER EXTREMITY ANGIOGRAPHY;  Surgeon: Algernon Huxley, MD;  Location: [place] [facility];  Service: Cardiovascular;  Laterality: Left;   LOWER EXTREMITY ANGIOGRAPHY Right  12/23/2018   Procedure: LOWER EXTREMITY ANGIOGRAPHY;  Surgeon: Algernon Huxley, MD;  Location: [place] [facility];  Service: Cardiovascular;  Laterality: Right;   LOWER EXTREMITY ANGIOGRAPHY Right 10/16/2020   Procedure: Lower Extremity Angiography;  Surgeon: Algernon Huxley, MD;  Location: [facility];  Service: Cardiovascular;  Laterality: Right;   TEE WITHOUT CARDIOVERSION N/A 06/08/2014   Procedure: TRANSESOPHAGEAL ECHOCARDIOGRAM (TEE);  Surgeon: Grace Isaac, MD;  Location: [place];  Service: Open Heart Surgery;  Laterality: N/A;   TEE WITHOUT CARDIOVERSION N/A 04/14/2020   Procedure: TRANSESOPHAGEAL ECHOCARDIOGRAM (TEE);  Surgeon: Minna Merritts, MD;  Location: [facility];  Service: Cardiovascular;  Laterality: N/A;   TOE AMPUTATION Right 12/2011   rt middle toe   UPPER GI ENDOSCOPY N/A 02/09/2020   Procedure: UPPER GI ENDOSCOPY;  Surgeon: Johnathan Hausen, MD;  Location: [facility];  Service: General;  Laterality:  N/A;   US ECHOCARDIOGRAPHY  06/2014   EF 50-55%, HK of inf/post/inferolat walls, Ao sclerosis     Family History: Family History  Problem Relation Age of Onset   CAD Mother    Hypertension Mother    Diabetes Mother    CAD Father    Diabetes Father    Sickle cell trait Other    Asthma Other      Social History: Social History   Socioeconomic History   Marital status: Widowed    Spouse name: Not on file   Number of children: Not on file   Years of education: Not on file   Highest education level: Not on file  Occupational History   Not on file  Tobacco Use   Smoking status: Former    Packs/day: 0.50    Years: 40.00    Pack years: 20.00    Types: Cigarettes   Smokeless tobacco: Never   Tobacco comments:    started Chantix 04/12/20  Vaping Use   Vaping Use: Never used  Substance and Sexual Activity   Alcohol use: No    Alcohol/week: 0.0 standard drinks   Drug use: Not Currently    Comment: crack   Sexual activity: Not on file  Other Topics Concern    Not on file  Social History Narrative   Not on file   Social Determinants of Health   Financial Resource Strain: Not on file  Food Insecurity: Not on file  Transportation Needs: Not on file  Physical Activity: Not on file  Stress: Not on file  Social Connections: Not on file  Intimate Partner Violence: Not on file     Review of Systems: Review of Systems  Unable to perform ROS: Critical illness   Vital Signs: Blood pressure (!) 129/55, pulse (!) 105, temperature (!) 100.76 F (38.2 C), resp. rate 20, height 5' 8"  (1.727 m), weight 110.7 kg, SpO2 98 %.  Weight trends: Filed Weights   10/18/2020 1416 10/27/2020 0801  Weight: 110.7 kg 110.7 kg    Physical Exam: General: sedated  Head: Normocephalic, atraumatic. Moist oral mucosal membranes  Eyes: Anicteric  Lungs:  Rhonchi bilaterally, Intubated on vent  Heart: Regular rate and rhythm  Abdomen:  Soft, nontender  Extremities:  1+ peripheral edema.  Neurologic: Nonfocal, moving all four extremities  Skin: Right heel wound, wrapped in guaze  GU Foley     Lab results: Basic Metabolic Panel: Recent Labs  Lab 10/09/2020 0000 09/29/2020 0620 10/23/20 0330 10/24/20 0430 10/03/2020 0452  NA  --  140 140 139 138  K  --  3.6 3.3* 3.4* 4.0  CL  --  105 104 105 104  CO2  --  23 28 27 26   GLUCOSE  --  210* 198* 175* 247*  BUN  --  22 27* 42* 47*  CREATININE  --  1.64* 2.07* 2.58* 2.97*  CALCIUM  --  7.6* 7.3* 7.1* 7.1*  MG 2.0 1.7 2.0 2.0 2.1  PHOS  --   --  2.1* 2.1* 3.0    Liver Function Tests: Recent Labs  Lab 10/09/2020 1419  AST 19  ALT 17  ALKPHOS 140*  BILITOT 1.5*  PROT 6.1*  ALBUMIN 2.1*   No results for input(s): LIPASE, AMYLASE in the last 168 hours. No results for input(s): AMMONIA in the last 168 hours.  CBC: Recent Labs  Lab 10/17/2020 1419 10/20/20 0930 10/04/2020 0000 10/15/2020 0620 10/23/20 0330 10/24/20 0430 10/24/2020 0452  WBC 14.7*   < >  24.9* 29.0* 19.6* 21.5* 22.2*  NEUTROABS 12.0*  --    --   --   --   --   --   HGB 10.0*   < > 9.1* 8.8* 8.8* 8.4* 8.4*  HCT 29.8*   < > 26.6* 25.0* 24.4* 24.5* 23.9*  MCV 77.2*   < > 77.1* 73.5* 72.8* 74.0* 73.5*  PLT 351   < > 345 345 339 328 304   < > = values in this interval not displayed.    Cardiac Enzymes: No results for input(s): CKTOTAL, CKMB, CKMBINDEX, TROPONINI in the last 168 hours.  BNP: Invalid input(s): POCBNP  CBG: Recent Labs  Lab 10/24/20 1944 10/24/20 2325 10/13/2020 0408 10/24/2020 0717 10/04/2020 1112  GLUCAP 242* 243* 245* 185* 215*    Microbiology: Results for orders placed or performed during the hospital encounter of 10/20/2020  Resp Panel by RT-PCR (Flu A&B, Covid) Nasopharyngeal Swab     Status: None   Collection Time: 10/24/2020  4:49 PM   Specimen: Nasopharyngeal Swab; Nasopharyngeal(NP) swabs in vial transport medium  Result Value Ref Range Status   SARS Coronavirus 2 by RT PCR NEGATIVE NEGATIVE Final    Comment: (NOTE) SARS-CoV-2 target nucleic acids are NOT DETECTED.  The SARS-CoV-2 RNA is generally detectable in upper respiratory specimens during the acute phase of infection. The lowest concentration of SARS-CoV-2 viral copies this assay can detect is 138 copies/mL. A negative result does not preclude SARS-Cov-2 infection and should not be used as the sole basis for treatment or other patient management decisions. A negative result may occur with  improper specimen collection/handling, submission of specimen other than nasopharyngeal swab, presence of viral mutation(s) within the areas targeted by this assay, and inadequate number of viral copies(<138 copies/mL). A negative result must be combined with clinical observations, patient history, and epidemiological information. The expected result is Negative.  Fact Sheet for Patients:  EntrepreneurPulse.com.au  Fact Sheet for Healthcare Providers:  IncredibleEmployment.be  This test is no t yet approved or  cleared by the [place] FDA and  has been authorized for detection and/or diagnosis of SARS-CoV-2 by FDA under an Emergency Use Authorization (EUA). This EUA will remain  in effect (meaning this test can be used) for the duration of the COVID-19 declaration under Section 564(b)(1) of the Act, 21 U.S.C.section 360bbb-3(b)(1), unless the authorization is terminated  or revoked sooner.       Influenza A by PCR NEGATIVE NEGATIVE Final   Influenza B by PCR NEGATIVE NEGATIVE Final    Comment: (NOTE) The Xpert Xpress SARS-CoV-2/FLU/RSV plus assay is intended as an aid in the diagnosis of influenza from Nasopharyngeal swab specimens and should not be used as a sole basis for treatment. Nasal washings and aspirates are unacceptable for Xpert Xpress SARS-CoV-2/FLU/RSV testing.  Fact Sheet for Patients: EntrepreneurPulse.com.au  Fact Sheet for Healthcare Providers: IncredibleEmployment.be  This test is not yet approved or cleared by the [place] FDA and has been authorized for detection and/or diagnosis of SARS-CoV-2 by FDA under an Emergency Use Authorization (EUA). This EUA will remain in effect (meaning this test can be used) for the duration of the COVID-19 declaration under Section 564(b)(1) of the Act, 21 U.S.C. section 360bbb-3(b)(1), unless the authorization is terminated or revoked.  Performed at [facility], [address] [place], [place] [address]   Blood Culture (routine x 2)     Status: None   Collection Time: 10/14/2020  4:49 PM   Specimen: BLOOD  Result Value  Ref Range Status   Specimen Description BLOOD RIGHT ANTECUBITAL  Final   Special Requests   Final    BOTTLES DRAWN AEROBIC AND ANAEROBIC Blood Culture adequate volume   Culture   Final    NO GROWTH 5 DAYS Performed at [facility], [address] [place], [place] [address]    Report Status 10/24/2020 FINAL  Final  Blood Culture (routine x 2)      Status: None   Collection Time: 10/07/2020  5:15 PM   Specimen: BLOOD  Result Value Ref Range Status   Specimen Description BLOOD LEFT ANTECUBITAL  Final   Special Requests   Final    BOTTLES DRAWN AEROBIC AND ANAEROBIC Blood Culture adequate volume   Culture   Final    NO GROWTH 5 DAYS Performed at [facility], [address], [place], [place] [address]    Report Status 10/24/2020 FINAL  Final  Culture, blood (Routine X 2) w Reflex to ID Panel     Status: None (Preliminary result)   Collection Time: 10/18/2020 10:18 AM   Specimen: BLOOD LEFT HAND  Result Value Ref Range Status   Specimen Description BLOOD LEFT HAND  Final   Special Requests   Final    BOTTLES DRAWN AEROBIC ONLY Blood Culture adequate volume   Culture   Final    NO GROWTH 3 DAYS Performed at [facility], [address] [place], [place] [address]    Report Status PENDING  Incomplete  Aerobic Culture w Gram Stain (superficial specimen)     Status: None   Collection Time: 10/10/2020  4:28 PM   Specimen: Foot; Wound  Result Value Ref Range Status   Specimen Description   Final    FOOT right Performed at [facility], [address] [place], [place] [address]    Special Requests   Final    NONE Performed at [facility], [address] [place], [place] [address]    Gram Stain   Final    NO WBC SEEN FEW GRAM POSITIVE COCCI FEW GRAM NEGATIVE RODS Performed at [facility], [place] [address], [place], [place] [address]    Culture   Final    ABUNDANT [facility] MORGANII FEW ESCHERICHIA COLI    Report Status 10/20/2020 FINAL  Final   Organism ID, Bacteria MORGANELLA MORGANII  Final   Organism ID, Bacteria ESCHERICHIA COLI  Final      Susceptibility   Escherichia coli - MIC*    AMPICILLIN >=32 RESISTANT Resistant     CEFAZOLIN >=64 RESISTANT Resistant     CEFEPIME <=0.12 SENSITIVE Sensitive     CEFTAZIDIME <=1 SENSITIVE Sensitive     CEFTRIAXONE <=0.25 SENSITIVE  Sensitive     CIPROFLOXACIN <=0.25 SENSITIVE Sensitive     GENTAMICIN <=1 SENSITIVE Sensitive     IMIPENEM <=0.25 SENSITIVE Sensitive     TRIMETH/SULFA <=20 SENSITIVE Sensitive     AMPICILLIN/SULBACTAM >=32 RESISTANT Resistant     PIP/TAZO <=4 SENSITIVE Sensitive     * FEW ESCHERICHIA COLI   Morganella morganii - MIC*    AMPICILLIN >=32 RESISTANT Resistant     CEFAZOLIN >=64 RESISTANT Resistant     CEFTAZIDIME <=1 SENSITIVE Sensitive     CIPROFLOXACIN <=0.25 SENSITIVE Sensitive     GENTAMICIN <=1 SENSITIVE Sensitive     IMIPENEM 1 SENSITIVE Sensitive     TRIMETH/SULFA <=20 SENSITIVE Sensitive     AMPICILLIN/SULBACTAM 16 INTERMEDIATE Intermediate     PIP/TAZO <=4 SENSITIVE Sensitive     *  ABUNDANT MORGANELLA MORGANII  MRSA Next Gen by PCR, Nasal     Status: None   Collection Time: 10/23/20 12:03 PM   Specimen: Nasal Mucosa; Nasal Swab  Result Value Ref Range Status   MRSA by PCR Next Gen NOT DETECTED NOT DETECTED Final    Comment: (NOTE) The GeneXpert MRSA Assay (FDA approved for NASAL specimens only), is one component of a comprehensive MRSA colonization surveillance program. It is not intended to diagnose MRSA infection nor to guide or monitor treatment for MRSA infections. Test performance is not FDA approved in patients less than 33 years old. Performed at [facility], [address] [place], [place] [address]     Coagulation Studies: No results for input(s): LABPROT, INR in the last 72 hours.  Urinalysis: No results for input(s): COLORURINE, LABSPEC, PHURINE, GLUCOSEU, HGBUR, BILIRUBINUR, KETONESUR, PROTEINUR, UROBILINOGEN, NITRITE, LEUKOCYTESUR in the last 72 hours.  Invalid input(s): APPERANCEUR    Imaging: No results found.   Assessment & Plan: Melinda [name] is a 65 y.o.  female with A fib/A flutter, CHF, diabetes, GERD, hypertension, PVD and hyperdipidemia, who was admitted to [facility] on 10/10/2020 for Wound infection [T14.8XXA, L08.9] Sepsis  without acute organ dysfunction, due to unspecified organism [facility]) [A41.9] Acute osteomyelitis of calcaneum, right ([place]) [M86.171]   Acute Kidney Injury with baseline creatinine 1.1 and GFR of 55 on 10/20/20. Labs from 6 months ago show an elevated creatinine at 1.5.  IV contrast exposure during angiogram 10/16/2020 No indication for dialysis at this time Avoid nephrotoxic agents and therapies if possible (NSAIDs, IV contrast, diuretics) UOP 1.5L over past 24 hours Will monitor UOP and renal function  2. Diabetes melitis, type 2, unspecified Hgb A1c 7.5 on 10/20/20 Home regimen includes Lantus and Novolog Ozempic weekly  3. Wound infection from unspecified organism at right heel Scheduled for right amputation today    LOS: 5 Melinda [name] 6/27/202212:59 PM

## 2020-10-25 NOTE — Progress Notes (Signed)
NAME:  Melinda [name], MRN:  [id number], DOB:  [birth year], LOS: 5 ADMISSION DATE:  10/04/2020  History of Present Illness:  65 year old female with CAD s/p CABG, chronic systolic heart failure  EF 30-35%, PVD s/p stents in left SFA, atrial fibrillation, stage III CKD admitted to hospitalist for sepsis secondary to OM of right heel   Underwent right lower extremity angiography on 6/23. Post-procedure no immediate complications however this evening she complained of chest/abdominal pain. She then went into respiratory arrest and code blue called and she was intubated and transferred to the ICU. On arrival to the ICU she was noted to be in ventricular tachycardia without a pulse. CPR was initiated and ROSC was achieved after 5 minutes. She received epi, sodium bicarb, amiodarone. Levophed was started for hypotension. She then developed bradycardia and received atropine 1 mg with some response. She continued to have intermittent episodes of bradycardia so epi gtt was added.   Pertinent  Medical History  CAD s/p CABG, chronic systolic heart failure EF 30-35%, PVD s/p stents in left SFA, atrial fibrillation, stage III CKD   Significant Hospital Events: Including procedures, antibiotic start and stop dates in addition to other pertinent events  6/21 6/23: Suffered respiratory arrest with subsequent V. Tach arrest 6/24: Awake and able to follow commands, weaning down vasopressors, Cardiology consulted 6/26 severe hypoxia, remains on vent 6/27 failed weaning trials, sister updated yesterday  Antibiotics Given (last 40 hours)     Date/Time Action Medication Dose Rate   10/13/2020 1308 New Bag/Given  [med unavailable]   Ampicillin-Sulbactam (UNASYN) 3 g in sodium chloride 0.9 % 100 mL IVPB 3 g 200 mL/hr   10/03/2020 1732 New Bag/Given   Ampicillin-Sulbactam (UNASYN) 3 g in sodium chloride 0.9 % 100 mL IVPB 3 g 200 mL/hr   10/23/20 0051 New Bag/Given   Ampicillin-Sulbactam (UNASYN) 3 g in sodium  chloride 0.9 % 100 mL IVPB 3 g 200 mL/hr   10/23/20 0531 New Bag/Given   Ampicillin-Sulbactam (UNASYN) 3 g in sodium chloride 0.9 % 100 mL IVPB 3 g 200 mL/hr   10/23/20 1401 New Bag/Given   Ampicillin-Sulbactam (UNASYN) 3 g in sodium chloride 0.9 % 100 mL IVPB 3 g 200 mL/hr   10/23/20 1827 New Bag/Given   Ampicillin-Sulbactam (UNASYN) 3 g in sodium chloride 0.9 % 100 mL IVPB 3 g 200 mL/hr   10/23/20 2233 New Bag/Given   vancomycin (VANCOREADY) IVPB 1500 mg/300 mL 1,500 mg 150 mL/hr   10/24/20 0022 New Bag/Given   Ampicillin-Sulbactam (UNASYN) 3 g in sodium chloride 0.9 % 100 mL IVPB 3 g 200 mL/hr   10/24/20 6294 New Bag/Given   Ampicillin-Sulbactam (UNASYN) 3 g in sodium chloride 0.9 % 100 mL IVPB 3 g 200 mL/hr   10/24/20 1152 New Bag/Given   piperacillin-tazobactam (ZOSYN) IVPB 3.375 g 3.375 g 12.5 mL/hr   10/24/20 2231 New Bag/Given   piperacillin-tazobactam (ZOSYN) IVPB 3.375 g 3.375 g 12.5 mL/hr   10/04/2020 0625 New Bag/Given   piperacillin-tazobactam (ZOSYN) IVPB 3.375 g 3.375 g 12.5 mL/hr        Interim History / Subjective:  Remains on vent Multiorgan failure  Prognosis is poor Worsening kidney function    CMP Latest Ref Rng & Units 10/02/2020 10/24/2020 10/23/2020  Glucose 70 - 99 mg/dL 247(H) 175(H) 198(H)  BUN 8 - 23 mg/dL 47(H) 42(H) 27(H)  Creatinine 0.44 - 1.00 mg/dL 2.97(H) 2.58(H) 2.07(H)  Sodium 135 - 145 mmol/L 138 139 140  Potassium 3.5 -  5.1 mmol/L 4.0 3.4(L) 3.3(L)  Chloride 98 - 111 mmol/L 104 105 104  CO2 22 - 32 mmol/L 26 27 28   Calcium 8.9 - 10.3 mg/dL 7.1(L) 7.1(L) 7.3(L)  Total Protein 6.5 - 8.1 g/dL - - -  Total Bilirubin 0.3 - 1.2 mg/dL - - -  Alkaline Phos 38 - 126 U/L - - -  AST 15 - 41 U/L - - -  ALT 0 - 44 U/L - - -          Objective   Blood pressure (!) 129/55, pulse (!) 105, temperature (!) 100.6 F (38.1 C), resp. rate 20, height 5\' 8"  (1.727 m), weight 110.7 kg, SpO2 100 %.    Vent Mode: PCV FiO2 (%):  [40 %-60 %] 50 % Set  Rate:  [10 bmp] 10 bmp PEEP:  [15 cmH20] 15 cmH20 Pressure Support:  [5 cmH20] 5 cmH20 Plateau Pressure:  [33 cmH20-34 cmH20] 33 cmH20   Intake/Output Summary (Last 24 hours) at 09/29/2020 0714 Last data filed at 10/17/2020 9485 Gross per 24 hour  Intake 1771.14 ml  Output 1575 ml  Net 196.14 ml   Filed Weights   10/11/2020 1416 10/01/2020 0801  Weight: 110.7 kg 110.7 kg      REVIEW OF SYSTEMS  PATIENT IS UNABLE TO PROVIDE COMPLETE REVIEW OF SYSTEMS DUE TO SEVERE CRITICAL ILLNESS AND TOXIC METABOLIC ENCEPHALOPATHY   PHYSICAL EXAMINATION:  GENERAL:critically ill appearing, +resp distress HEAD: Normocephalic, atraumatic.  EYES: Pupils equal, round, reactive to light.  No scleral icterus.  MOUTH: Moist mucosal membrane. NECK: Supple. PULMONARY: +rhonchi, +wheezing CARDIOVASCULAR: S1 and S2. Regular rate and rhythm. No murmurs, rubs, or gallops.  GASTROINTESTINAL: Soft, nontender, -distended. Positive bowel sounds.  MUSCULOSKELETAL: RT heel ulcer and osteo NEUROLOGIC: obtunded SKIN:intact,warm,dry   Labs/imaging that I havepersonally reviewed  (right click and "Reselect all SmartList Selections" daily)       ASSESSMENT AND PLAN SYNOPSIS  65 yo morbidly obese AAF with acute and severe ischemic cardiomyopathy with acute and severe Cardiogenic/septic shock with severe metabolic encephalopathy with severe acute systolic heart failure source of infection/inflammation RT foot ulcer/gangrene    Severe ACUTE Hypoxic and Hypercapnic Respiratory Failure -continue Mechanical Ventilator support -continue Bronchodilator Therapy -Wean Fio2 and PEEP as tolerated -VAP/VENT bundle implementation   CARDIAC FAILURE-acute  systolic dysfunction -oxygen as needed -Lasix as tolerated -follow up cardiac enzymes as indicated   CARDIAC ICU monitoring   ACUTE KIDNEY INJURY/Renal Failure -continue Foley Catheter-assess need -Avoid nephrotoxic agents -Follow urine output,  BMP -Ensure adequate renal perfusion, optimize oxygenation -Renal dose medications Will need nephology consultation   NEUROLOGY Acute toxic metabolic encephalopathy, need for sedation Goal RASS -2 to -3   SEPTIC SHOCK RT heel ulcer and gangrene -use vasopressors to keep MAP>65 as needed -follow ABG and LA -follow up cultures -emperic ABX  INFECTIOUS DISEASE -continue antibiotics as prescribed -follow up cultures -follow up ID consultation Obtain vasc surgery input for amputation  ENDO - ICU hypoglycemic\Hyperglycemia protocol -check FSBS per protocol   GI GI PROPHYLAXIS as indicated  NUTRITIONAL STATUS DIET-->TF's as tolerated Constipation protocol as indicated   ELECTROLYTES -follow labs as needed -replace as needed -pharmacy consultation and following    Best Practice (right click and "Reselect all SmartList Selections" daily)    Diet/type: NPO, start tube feeds Pain/Anxiety/Delirium protocol RASS goal: 0 to -1 VAP protocol (if indicated): Yes DVT prophylaxis: systemic heparin GI prophylaxis: PPI Glucose control:  SSI Central venous access:  Yes, and it is still needed Arterial  line:  Yes, and it is still needed Foley:  Yes, and it is still needed Mobility:  bed rest  PT consulted: N/A Studies pending: Echocardiogram Culture data pending:blood Last reviewed culture data:today Antibiotics:unasyn and vanc Antibiotic de-escalation: no,  continue current rx Stop date: to be determined  Code Status:  full code Last date of multidisciplinary goals of care discussion [6/24] ccm prognosis: Life-threating Disposition: remains critically ill, will stay in intensive care    Labs   CBC: Recent Labs  Lab 10/15/2020 1419 10/20/20 0930 10/09/2020 0000 10/24/2020 0620 10/23/20 0330 10/24/20 0430 10/05/2020 0452  WBC 14.7*   < > 24.9* 29.0* 19.6* 21.5* 22.2*  NEUTROABS 12.0*  --   --   --   --   --   --   HGB 10.0*   < > 9.1* 8.8* 8.8* 8.4* 8.4*  HCT 29.8*    < > 26.6* 25.0* 24.4* 24.5* 23.9*  MCV 77.2*   < > 77.1* 73.5* 72.8* 74.0* 73.5*  PLT 351   < > 345 345 339 328 304   < > = values in this interval not displayed.    Basic Metabolic Panel: Recent Labs  Lab 10/15/2020 2030 10/15/2020 0000 10/07/2020 0620 10/23/20 0330 10/24/20 0430 09/29/2020 0452  NA 137  --  140 140 139 138  K 4.7  --  3.6 3.3* 3.4* 4.0  CL 103  --  105 104 105 104  CO2 17*  --  23 28 27 26   GLUCOSE 218*  --  210* 198* 175* 247*  BUN 18  --  22 27* 42* 47*  CREATININE 1.51*  --  1.64* 2.07* 2.58* 2.97*  CALCIUM 7.8*  --  7.6* 7.3* 7.1* 7.1*  MG 2.4 2.0 1.7 2.0 2.0 2.1  PHOS  --   --   --  2.1* 2.1* 3.0   GFR: Estimated Creatinine Clearance: 24 mL/min (A) (by C-G formula based on SCr of 2.97 mg/dL (H)). Recent Labs  Lab 10/20/20 0930 10/01/2020 0519 10/16/2020 0620 10/06/2020 0645 10/23/2020 1018 10/24/2020 1303 10/27/2020 1505 10/23/20 0330 10/24/20 0430 10/07/2020 0452  PROCALCITON <0.10  --   --   --  2.80  --   --  3.85 4.45  --   WBC 15.0*   < > 29.0*  --   --   --   --  19.6* 21.5* 22.2*  LATICACIDVEN  --    < >  --    < > 6.0* 2.4* 2.0* 1.8  --   --    < > = values in this interval not displayed.    Liver Function Tests: Recent Labs  Lab 10/14/2020 1419  AST 19  ALT 17  ALKPHOS 140*  BILITOT 1.5*  PROT 6.1*  ALBUMIN 2.1*   No results for input(s): LIPASE, AMYLASE in the last 168 hours. No results for input(s): AMMONIA in the last 168 hours.  ABG    Component Value Date/Time   PHART 7.51 (H) 10/23/2020 0500   PCO2ART 34 10/23/2020 0500   PO2ART 62 (L) 10/23/2020 0500   HCO3 27.1 10/23/2020 0500   TCO2 22 06/09/2014 1822   ACIDBASEDEF 8.7 (H) 10/15/2020 2300   O2SAT 93.6 10/23/2020 0500     Coagulation Profile: Recent Labs  Lab 10/04/2020 1649 10/20/20 0930 10/13/2020 2030  INR 2.0* 1.9* 1.9*    Cardiac Enzymes: No results for input(s): CKTOTAL, CKMB, CKMBINDEX, TROPONINI in the last 168 hours.  HbA1C: Hemoglobin A1C  Date/Time Value  Ref Range  Status  06/02/2014 12:03 AM 11.5 (H) 4.2 - 6.3 % Final    Comment:    The American Diabetes Association recommends that a primary goal of therapy should be <7% and that physicians should reevaluate the treatment regimen in patients with HbA1c values consistently >8%.   01/19/2012 01:52 AM 13.5 (H) 4.2 - 6.3 % Final    Comment:    The American Diabetes Association recommends that a primary goal of therapy should be <7% and that physicians should reevaluate the treatment regimen in patients with HbA1c values consistently >8%.    Hgb A1c MFr Bld  Date/Time Value Ref Range Status  10/20/2020 09:24 AM 7.5 (H) 4.8 - 5.6 % Final    Comment:    (NOTE) Pre diabetes:          5.7%-6.4%  Diabetes:              >6.4%  Glycemic control for   <7.0% adults with diabetes   05/26/2020 08:24 AM 8.0 (H) 4.8 - 5.6 % Final    Comment:    (NOTE) Pre diabetes:          5.7%-6.4%  Diabetes:              >6.4%  Glycemic control for   <7.0% adults with diabetes     CBG: Recent Labs  Lab 10/24/20 1126 10/24/20 1611 10/24/20 1944 10/24/20 2325 10/07/2020 0408  GLUCAP 191* 186* 242* 243* 245*    Allergies No Known Allergies     DVT/GI PRX  assessed I Assessed the need for Labs I Assessed the need for Foley I Assessed the need for Central Venous Line Family Discussion when available I Assessed the need for Mobilization I made an Assessment of medications to be adjusted accordingly Safety Risk assessment completed  CASE DISCUSSED IN MULTIDISCIPLINARY ROUNDS WITH ICU TEAM     Critical Care Time devoted to patient care services described in this note is 55 minutes.  Critical care was necessary to treat or prevent imminent or life-threatening deterioration. Overall, patient is critically ill, prognosis is guarded.  Patient with Multiorgan failure and at high risk for cardiac arrest and death.    Corrin Parker, M.D.  [facility]   Medical Director [place] Director [facility] Cardio-Pulmonary Department

## 2020-10-25 NOTE — Progress Notes (Signed)
Date of Admission:  10/03/2020    ID: Melinda [name] is a 65 y.o. female  Principal Problem:   Acute respiratory failure with hypoxia (Melinda [name]) Active Problems:   Essential hypertension   S/P CABG x 4   Obesity   Hyperglycemia   Amputated toe, left (HCC)   Amputated toe, right (HCC)   S/P gastric bypass   CKD (chronic kidney disease), stage IIIa   Depression   AF (paroxysmal atrial fibrillation) (HCC)   Wound infection   Sepsis ([place])   Acute osteomyelitis of calcaneum, right (HCC)   HFrEF (heart failure with reduced ejection fraction) (HCC)   Wide-complex tachycardia (HCC)  LDA Right IJ CVC triple-lumen placed on 10/03/2020 Foley catheter placed on 10/10/2020 Arterial line placed on 10/13/2020.   Subjective: Patient remains acutely ill. Intubated On pressors  Medications:   acetaminophen  650 mg Per Tube Q4H   Or   acetaminophen (TYLENOL) oral liquid 160 mg/5 mL  650 mg Per Tube Q4H   Or   acetaminophen  650 mg Rectal Q4H   aspirin  81 mg Per Tube Daily   atorvastatin  80 mg Per Tube QHS   chlorhexidine  60 mL Topical Once   chlorhexidine gluconate (MEDLINE KIT)  15 mL Mouth Rinse BID   Chlorhexidine Gluconate Cloth  6 each Topical Daily   cholecalciferol  2,000 Units Per Tube Daily   clopidogrel  75 mg Per Tube Daily   docusate  100 mg Per Tube BID   feeding supplement (PIVOT 1.5 CAL)  1,000 mL Per Tube Q24H   feeding supplement (PROSource TF)  45 mL Per Tube QID   free water  30 mL Per Tube Q4H   insulin aspart  0-15 Units Subcutaneous Q4H   insulin glargine  6 Units Subcutaneous QHS   ipratropium-albuterol  3 mL Nebulization Q4H   mouth rinse  15 mL Mouth Rinse 10 times per day   multivitamin with minerals  1 tablet Per Tube Daily   nutrition supplement (JUVEN)  1 packet Per Tube BID BM   pantoprazole (PROTONIX) IV  40 mg Intravenous QHS   polyethylene glycol  17 g Per Tube Daily   povidone-iodine  2 application Topical Once    Objective: Vital signs in  last 24 hours: Temp:  [98.42 F (36.9 C)-100.6 F (38.1 C)] 100.58 F (38.1 C) (06/27 0800) Pulse Rate:  [99-106] 105 (06/26 1800) Resp:  [9-20] 20 (06/27 0800) BP: (129-143)/(55-66) 129/55 (06/26 1530) SpO2:  [94 %-100 %] 98 % (06/27 1054) Arterial Line BP: (87-165)/(36-66) 105/45 (06/27 1000) FiO2 (%):  [35 %-50 %] 35 % (06/27 1054)  PHYSICAL EXAM:  General: Intubated on pressors Lungs: Bilateral air entry.  Crepitations in the bases. Heart: Irregular Abdomen: Soft, non-tender,not distended. Bowel sounds normal. No masses Extremities: Progressing necrosis and gangrene of the right heel and foot        Lymph: Cervical, supraclavicular normal. Neurologic: Cannot assess  Lab Results Recent Labs    10/24/20 0430 10/18/2020 0452  WBC 21.5* 22.2*  HGB 8.4* 8.4*  HCT 24.5* 23.9*  NA 139 138  K 3.4* 4.0  CL 105 104  CO2 27 26  BUN 42* 47*  CREATININE 2.58* 2.97*   Liver Panel No results for input(s): PROT, ALBUMIN, AST, ALT, ALKPHOS, BILITOT, BILIDIR, IBILI in the last 72 hours. Sedimentation Rate No results for input(s): ESRSEDRATE in the last 72 hours. C-Reactive Protein No results for input(s): CRP in the last 72 hours.  Microbiology:  10/07/2020 blood culture no growth. 10/03/2020 wound culture.  Morganella and E. coli from the right heel necrotic wound Studies/Results: No results found.   Assessment/Plan: Progressing necrosis and gangrene of the right foot with persistent leukocytosis.  Morganella and E. coli from the wound.  Blood cultures negative. Septic shock. Patient remains on pressors. Currently on Zosyn. Discussed with vascular surgeon regarding amputation.  She is a high risk candidate for amputation but because of ongoing sepsis from the gangrene she will not get better without the amputation.  Peripheral artery disease.  Underwent right extremity angio on 10/24/2020.  Post procedure there was no immediate complication but late in the evening she  complained of chest pain and abdominal pain and went into respiratory arrest and CODE BLUE was called and she was intubated and transferred to ICU.  Acute respiratory failure.  Intubated.  Cardiac failure.  2D echo done on 09/29/2020 shows global hypokinesis in the left ventricle.  Acute kidney injury.  Combination of sepsis, contrast during angio and neopulmonary arrest. Good urine output. Nephrology on board  Patient is critically ill. Discussed the management with her sister at bedside and with the care team

## 2020-10-25 NOTE — Progress Notes (Signed)
Inpatient Diabetes Program Recommendations  AACE/ADA: New Consensus Statement on Inpatient Glycemic Control   Target Ranges:  Prepandial:   less than 140 mg/dL      Peak postprandial:   less than 180 mg/dL (1-2 hours)      Critically ill patients:  140 - 180 mg/dL   Results for Melinda [name], [name] (MRN [id number]) as of 10/13/2020 10:32  Ref. Range 10/24/2020 07:42 10/24/2020 11:26 10/24/2020 16:11 10/24/2020 19:44 10/24/2020 23:25 10/07/2020 04:08 10/05/2020 07:17  Glucose-Capillary Latest Ref Range: 70 - 99 mg/dL 162 (H) 191 (H) 186 (H) 242 (H) 243 (H) 245 (H) 185 (H)    Review of Glycemic Control  Diabetes history: DM2 Outpatient Diabetes medications: Lantus 6 units QHS, Novolog 4 units TID with meals, Ozempic 0.5 mg Qweek Current orders for Inpatient glycemic control: Lantus 6 units QHS, Novolog 0-15 units Q4H; Pivot @ 40 ml/hr  Inpatient Diabetes Program Recommendations:    Insulin:  Please consider ordering Novolog 3 units Q4H for tube feeding coverage. If tube feeding is stopped or held then Novolog tube feeding coverage should also be stopped or held.  Thanks, Barnie Alderman, RN, MSN, CDE Diabetes Coordinator Inpatient Diabetes Program [phone] (Team Pager from 8am to 5pm)

## 2020-10-25 NOTE — Consult Note (Signed)
[place] for Heparin Drip Indication: ACS/NSTEMI  No Known Allergies  Patient Measurements: Height: 5\' 8"  (172.7 cm) Weight: 110.7 kg (244 lb) IBW/kg (Calculated) : 63.9 Heparin dosing wt 89.1 kg  Vital Signs: Temp: 99.14 F (37.3 C) (06/26 1800) Pulse Rate: 105 (06/26 1800)  Labs: Recent Labs    10/13/2020 1018 10/05/2020 1303 10/18/2020 1505 10/01/2020 1628 10/17/2020 1720 10/23/20 0330 10/23/20 1425 10/23/20 2240 10/24/20 0430 10/20/2020 0452  HGB  --   --   --   --   --  8.8*  --   --  8.4* 8.4*  HCT  --   --   --   --   --  24.4*  --   --  24.5* 23.9*  PLT  --   --   --   --   --  339  --   --  328 304  APTT  --   --    < >  --    < > >200* 121* 87* 87*  --   HEPARINUNFRC  --   --    < >  --   --  0.69  --   --  0.44 0.26*  CREATININE  --   --   --   --   --  2.07*  --   --  2.58* 2.97*  TROPONINIHS 48* 53*  --  46*  --   --   --   --   --   --    < > = values in this interval not displayed.     Estimated Creatinine Clearance: 24 mL/min (A) (by C-G formula based on SCr of 2.97 mg/dL (H)).   Medications: NKDA PTA apixiban dose 5mg  bid - last dose 6/21 @ 1000 Also on plavix 75mg  QD & ASA 81mg  QD Heparin dosing wt 89.1 kg  Assessment: 65 yo female with history of a fib (on eliquis) and PAD - pharmacy has been consulted to initiate and monitor a heparin drip pending surgical plan. 6/24:  Heparin gtt stopped on 6/23 @ 0755 in preparation for vascular surgery. APTT @ 1259 = 49, subtherapeutic but reflects ~ 5 hrs since heparin was d/c'd. Pt had elevated troponins on 6/24 @ 0000 so heparin was restarted for ACS/NSTEMI.   Date Time aPTT/HL Rate/Comment 6/21 1000 ---  Last dose of eliquis PTA; 1100 un/hr 6/22 0924 45s / >1.10 1100 un/hr > 1400 un/hr 6/23 0755 49s / --- Drip was off for 5hrs for Vasc Sx 6/24 0000 ---  Resumed for ACS/NSTEMI - trop elevation 6/24  0645 158s / 0.71 1700 > 1500 un/hr (hold 1hr; then lower  rate) 6/24 1505 >200s/ 0.82 Both levels higher than expected given rate reduction --> recollect   6/24 1720 > 200s  Hold, 1 hour , then lower rate  1500 > 1150 units/hr 6/25     0330   > 200s / 0.69   Hold 1 hr then start lower rate of 800 units/hr 6/25 1425 121  Rate 800 > 600 units/hr 6/25     2240     87                  600 units/hr , therapeutic X 1 6/26     0430     87 s/ 0.44      600 units/hr, therapeutic X 2  6/27     0452     0.26  subtherapeutic    Goal of Therapy:  Heparin level 0.3-0.7 units/ml; aPTT 66-102s Monitor platelets by anticoagulation protocol: Yes   Plan:  6/27:  HL @ 0452 = 0.26 Will order heparin 1300 units IV X 1 bolus and increase drip rate to 800 units/hr. Will recheck HL 8 hrs after rate change.   Orene Desanctis, PharmD Clinical Pharmacist 10/23/2020 5:38 AM

## 2020-10-25 NOTE — Interval H&P Note (Signed)
History and Physical Interval Note:  10/20/2020 4:35 PM  Melinda [name]  has presented today for surgery, with the diagnosis of Severe atherosclerotic disease with gangrenous changes.  The various methods of treatment have been discussed with the patient and family.  It is felt that her right foot osteomyelitis is causing her profound sepsis, and amputation to remove the source of infection has been recommended by infectious disease.  An open amputation will be performed due to the infection.  After consideration of risks, benefits and other options for treatment, the patient has consented to  Procedure(s): AMPUTATION BELOW KNEE (Right) as a surgical intervention.  The patient's history has been reviewed, patient examined, no change in status, stable for surgery.  I have reviewed the patient's chart and labs.  Questions were answered to the patient's satisfaction.     [name]

## 2020-10-25 NOTE — Consult Note (Addendum)
PHARMACY CONSULT NOTE  Pharmacy Consult for Electrolyte Monitoring and Replacement   Recent Labs: Potassium (mmol/L)  Date Value  10/24/2020 4.0  06/03/2014 4.1   Magnesium (mg/dL)  Date Value  10/27/2020 2.1   Calcium (mg/dL)  Date Value  10/27/2020 7.1 (L)   Calcium, Total (mg/dL)  Date Value  06/03/2014 8.1 (L)   Albumin (g/dL)  Date Value  10/06/2020 2.1 (L)  10/10/2016 3.9  06/01/2014 3.2 (L)   Phosphorus (mg/dL)  Date Value  10/24/2020 3.0   Sodium (mmol/L)  Date Value  10/23/2020 138  10/10/2016 139  06/03/2014 140     Assessment: 65yo female w/ h/o  CKD 3B, IDDM, anxiety/MDD, HTN, Afib, OA, PAD (s/p Lt first toe amp & right toe amputation), HFrEF (EF30-35%), CAD (s/p CABG x4), sickle cell trait, tobacco dependency, s/p gastric bypass, presenting with chief concerns of right heel wound & osteomyelitis.   Admission: sepsis secondary to right heel ulcer/gangrene  6/27:  Declining renal function, Scr 2.97. BUN 47  Potassium is improved from 6/26  Corrected calcium: 8.62 mg/dL (per albumin 6/21)   Nutrition:  Juven BID between meals  PROSource TF 45 mL QID  Pivot @ 40 mL/hr  Free water 30 mL q4h   Goal of Therapy:  Magnesium 2.0 - 2.4 mg/dL  Potassium 4.0 - 5.1 mmol/L  All other electrolytes WNL  Plan:  No electrolyte replacements today  FU electrolytes in the morning    [place] Lynda Capistran

## 2020-10-25 NOTE — Progress Notes (Signed)
Pt responds to voice and pain. Unable to follow commands. Pt had clot and moderate amount of blood suctioned from her oral cavity, MD made aware and heparin stopped per MD. Pt prepared for surgical procedure. 1 unit PRBCs administered. Pt came back from procedure with wound vac to RLE. PRN versed for dyssynchrony. Report handed off to Karen Chafe. RN.

## 2020-10-26 ENCOUNTER — Encounter: Payer: Self-pay | Admitting: Vascular Surgery

## 2020-10-26 ENCOUNTER — Ambulatory Visit: Payer: Medicare HMO | Admitting: Physician Assistant

## 2020-10-26 ENCOUNTER — Inpatient Hospital Stay: Payer: Medicare HMO

## 2020-10-26 DIAGNOSIS — R579 Shock, unspecified: Secondary | ICD-10-CM

## 2020-10-26 DIAGNOSIS — I472 Ventricular tachycardia: Secondary | ICD-10-CM

## 2020-10-26 LAB — CBC
HCT: 23.5 % — ABNORMAL LOW (ref 36.0–46.0)
Hemoglobin: 8.1 g/dL — ABNORMAL LOW (ref 12.0–15.0)
MCH: 26 pg (ref 26.0–34.0)
MCHC: 34.5 g/dL (ref 30.0–36.0)
MCV: 75.6 fL — ABNORMAL LOW (ref 80.0–100.0)
Platelets: 245 10*3/uL (ref 150–400)
RBC: 3.11 MIL/uL — ABNORMAL LOW (ref 3.87–5.11)
RDW: 20.1 % — ABNORMAL HIGH (ref 11.5–15.5)
WBC: 21.1 10*3/uL — ABNORMAL HIGH (ref 4.0–10.5)
nRBC: 0.7 % — ABNORMAL HIGH (ref 0.0–0.2)

## 2020-10-26 LAB — TYPE AND SCREEN
ABO/RH(D): O POS
Antibody Screen: NEGATIVE
Unit division: 0

## 2020-10-26 LAB — BASIC METABOLIC PANEL
Anion gap: 6 (ref 5–15)
BUN: 57 mg/dL — ABNORMAL HIGH (ref 8–23)
CO2: 27 mmol/L (ref 22–32)
Calcium: 7.1 mg/dL — ABNORMAL LOW (ref 8.9–10.3)
Chloride: 104 mmol/L (ref 98–111)
Creatinine, Ser: 3.24 mg/dL — ABNORMAL HIGH (ref 0.44–1.00)
GFR, Estimated: 15 mL/min — ABNORMAL LOW (ref >60)
Glucose, Bld: 175 mg/dL — ABNORMAL HIGH (ref 70–99)
Potassium: 4.4 mmol/L (ref 3.5–5.1)
Sodium: 137 mmol/L (ref 135–145)

## 2020-10-26 LAB — BPAM RBC
Blood Product Expiration Date: 202207272359
ISSUE DATE / TIME: 202206271418
Unit Type and Rh: 5100

## 2020-10-26 LAB — MAGNESIUM: Magnesium: 2.2 mg/dL (ref 1.7–2.4)

## 2020-10-26 LAB — TROPONIN I (HIGH SENSITIVITY)
Troponin I (High Sensitivity): 46 ng/L — ABNORMAL HIGH (ref <18)
Troponin I (High Sensitivity): 43 ng/L — ABNORMAL HIGH (ref <18)

## 2020-10-26 LAB — PHOSPHORUS: Phosphorus: 4.8 mg/dL — ABNORMAL HIGH (ref 2.5–4.6)

## 2020-10-26 LAB — GLUCOSE, CAPILLARY
Glucose-Capillary: 123 mg/dL — ABNORMAL HIGH (ref 70–99)
Glucose-Capillary: 155 mg/dL — ABNORMAL HIGH (ref 70–99)
Glucose-Capillary: 145 mg/dL — ABNORMAL HIGH (ref 70–99)

## 2020-10-26 MED ORDER — GLYCOPYRROLATE 0.2 MG/ML IJ SOLN: 0.2000 mg | INTRAMUSCULAR | Status: DC | PRN

## 2020-10-26 MED ORDER — POLYVINYL ALCOHOL 1.4 % OP SOLN
1.0000 [drp] | Freq: Four times a day (QID) | OPHTHALMIC | Status: DC | PRN
  Filled 2020-10-26: qty 15

## 2020-10-26 MED ORDER — GLYCOPYRROLATE 1 MG PO TABS
1.0000 mg | ORAL_TABLET | ORAL | Status: DC | PRN
  Filled 2020-10-26: qty 1

## 2020-10-26 MED ORDER — MORPHINE 100MG IN NS 100ML (1MG/ML) PREMIX INFUSION
0.0000 mg/h | INTRAVENOUS | Status: DC
  Administered 2020-10-26: 5 mg/h via INTRAVENOUS
  Filled 2020-10-26: qty 100

## 2020-10-26 MED ORDER — ACETAMINOPHEN 325 MG PO TABS: 650.0000 mg | ORAL_TABLET | Freq: Four times a day (QID) | ORAL | Status: DC | PRN

## 2020-10-26 MED ORDER — ASCORBIC ACID 500 MG PO TABS: 250.0000 mg | ORAL_TABLET | Freq: Two times a day (BID) | ORAL | Status: DC

## 2020-10-26 MED ORDER — LIDOCAINE IN D5W 4-5 MG/ML-% IV SOLN
1.0000 mg/min | INTRAVENOUS | Status: DC
  Administered 2020-10-26: 1 mg/min via INTRAVENOUS
  Filled 2020-10-26: qty 500

## 2020-10-26 MED ORDER — MORPHINE SULFATE (PF) 2 MG/ML IV SOLN: 2.0000 mg | INTRAVENOUS | Status: DC | PRN

## 2020-10-26 MED ORDER — ACETAMINOPHEN 650 MG RE SUPP: 650.0000 mg | Freq: Four times a day (QID) | RECTAL | Status: DC | PRN

## 2020-10-26 MED ORDER — AMIODARONE IV BOLUS ONLY 150 MG/100ML
150.0000 mg | Freq: Once | INTRAVENOUS | Status: AC
  Administered 2020-10-26: 150 mg via INTRAVENOUS

## 2020-10-26 MED ORDER — MIDAZOLAM HCL 2 MG/2ML IJ SOLN
2.0000 mg | INTRAMUSCULAR | Status: DC | PRN
  Administered 2020-10-26: 2 mg via INTRAVENOUS
  Filled 2020-10-26: qty 4

## 2020-10-26 MED ORDER — VASOPRESSIN 20 UNITS/100 ML INFUSION FOR SHOCK
0.0000 [IU]/min | INTRAVENOUS | Status: DC
  Administered 2020-10-26: 0.03 [IU]/min via INTRAVENOUS
  Filled 2020-10-26 (×2): qty 100

## 2020-10-26 MED ORDER — AMIODARONE HCL IN DEXTROSE 360-4.14 MG/200ML-% IV SOLN
60.0000 mg/h | INTRAVENOUS | Status: DC
  Administered 2020-10-26 (×2): 60 mg/h via INTRAVENOUS
  Filled 2020-10-26: qty 200

## 2020-10-26 MED ORDER — AMIODARONE IV BOLUS ONLY 150 MG/100ML
INTRAVENOUS | Status: AC
  Filled 2020-10-26: qty 100

## 2020-10-26 MED ORDER — HYDROCORTISONE NA SUCCINATE PF 100 MG IJ SOLR
50.0000 mg | Freq: Four times a day (QID) | INTRAMUSCULAR | Status: DC
  Administered 2020-10-26: 50 mg via INTRAVENOUS
  Filled 2020-10-26: qty 2

## 2020-10-26 MED ORDER — DEXTROSE 5 % IV SOLN: INTRAVENOUS | Status: DC

## 2020-10-26 MED ORDER — MORPHINE BOLUS VIA INFUSION
5.0000 mg | INTRAVENOUS | Status: DC | PRN
  Filled 2020-10-26: qty 5

## 2020-10-26 MED ORDER — GLYCOPYRROLATE 0.2 MG/ML IJ SOLN
0.2000 mg | INTRAMUSCULAR | Status: DC | PRN
  Administered 2020-10-26: 0.2 mg via INTRAVENOUS
  Filled 2020-10-26: qty 1

## 2020-10-26 MED ORDER — DIPHENHYDRAMINE HCL 50 MG/ML IJ SOLN: 25.0000 mg | INTRAMUSCULAR | Status: DC | PRN

## 2020-10-26 MED ORDER — LIDOCAINE BOLUS VIA INFUSION
50.0000 mg | Freq: Once | INTRAVENOUS | Status: AC
  Administered 2020-10-26: 50 mg via INTRAVENOUS
  Filled 2020-10-26: qty 52

## 2020-10-27 LAB — CULTURE, BLOOD (ROUTINE X 2)
Culture: NO GROWTH
Special Requests: ADEQUATE

## 2020-10-27 LAB — SURGICAL PATHOLOGY

## 2020-10-29 NOTE — Progress Notes (Signed)
GOALS OF CARE DISCUSSION  The Clinical status was relayed to family in detail. Sisters and Brother of patient at bedside Updated and notified of patients medical condition.    Patient remains unresponsive and will not open eyes to command.   Patient is having a weak cough and struggling to remove secretions.   Patient with increased WOB and using accessory muscles to breathe Explained to family course of therapy and the modalities  Patient is suffering and active process of dying  Patient with Progressive multiorgan failure with a very high probablity of a very minimal chance of meaningful recovery despite all aggressive and optimal medical therapy.    Family understands the situation.  They have consented and agreed to DNR/DNI   Family are satisfied with Plan of action and management. All questions answered  Additional CC time 35 mins   Reynald Woods Patricia Pesa, M.D.  [facility]  Medical Director [place] Director [facility] Cardio-Pulmonary Department

## 2020-10-29 NOTE — Progress Notes (Signed)
Overnight cardiac rhythm changed to a ventricular rhythm with rates as high as 150, notified [place] NP, received orders for Amiodarone bolus, EKG and Troponin. As well as lidocaine drip once we saw that Amiodarone bolus did not work. Had issue with wound vac at beginning of shift, it became blocked with blood clots, replaced tubing and cartridge, it is now functioning properly after ensuring proper seal as well.

## 2020-10-29 NOTE — Progress Notes (Signed)
GOALS OF CARE DISCUSSION  The Clinical status was relayed to family in detail. Sister at bedside Updated and notified of patients medical condition.    Patient remains unresponsive and will not open eyes to command.   Patient is having a weak cough and struggling to remove secretions.   Patient with increased WOB and using accessory muscles to breathe Explained to family course of therapy and the modalities  Severe shock Severe hypoxia Progressive multiorgan failure Elevated HR and RR  I have asked Sister to call all family to bedside Patient is suffering and active dying process-very high risk for cardiac arrest    Patient with Progressive multiorgan failure with a very high probablity of a very minimal chance of meaningful recovery despite all aggressive and optimal medical therapy.  PATIENT REMAINS FULL CODE  Family understands the situation.   Family are satisfied with Plan of action and management. All questions answered  Additional CC time 40 mins   Shanele Nissan Patricia Pesa, M.D.  [facility]  Medical Director [place] Director [facility] Cardio-Pulmonary Department

## 2020-10-29 NOTE — Plan of Care (Signed)
Neuro: unresponsive throughout shift, pupils fixed and dilated, no cough/gag/corneal reflex Resp: tolerating ventilator settings CV: TMAX 39.9, significant edema, arrhythmias throughout the day, weak pulses GIGU: hypoactive bowel sounds, tolerating tube feeds well, no BM, foley in place-decreasing urine output Skin: mostly intact, wound vac to right lower extremity Social: 2 sisters, 1 brother, and their partners at the bedside throughout the day, extensive conversation had with this RN and Dr. Mortimer Fries throughout the day regarding patient's deteriorating status, all questions and concerns addressed  Events: transitioned to comfort care, TOD 1604, pt was surrounded by her siblings and their partners.  Problem: Education: Goal: Knowledge of General Education information will improve Description: Including pain rating scale, medication(s)/side effects and non-pharmacologic comfort measures Outcome: Not Met (add Reason)   Problem: Health Behavior/Discharge Planning: Goal: Ability to manage health-related needs will improve Outcome: Not Met (add Reason)   Problem: Clinical Measurements: Goal: Ability to maintain clinical measurements within normal limits will improve Outcome: Not Met (add Reason) Goal: Will remain free from infection Outcome: Not Met (add Reason) Goal: Diagnostic test results will improve Outcome: Not Met (add Reason) Goal: Respiratory complications will improve Outcome: Not Met (add Reason) Goal: Cardiovascular complication will be avoided Outcome: Not Met (add Reason)   Problem: Activity: Goal: Risk for activity intolerance will decrease Outcome: Not Met (add Reason)   Problem: Nutrition: Goal: Adequate nutrition will be maintained Outcome: Not Met (add Reason)   Problem: Coping: Goal: Level of anxiety will decrease Outcome: Not Met (add Reason)   Problem: Elimination: Goal: Will not experience complications related to bowel motility Outcome: Not Met (add  Reason) Goal: Will not experience complications related to urinary retention Outcome: Not Met (add Reason)   Problem: Pain Managment: Goal: General experience of comfort will improve Outcome: Not Met (add Reason)   Problem: Safety: Goal: Ability to remain free from injury will improve Outcome: Not Met (add Reason)   Problem: Skin Integrity: Goal: Risk for impaired skin integrity will decrease Outcome: Not Met (add Reason)

## 2020-10-29 NOTE — Progress Notes (Signed)
Pt. Extubated to room air. 

## 2020-10-29 NOTE — Progress Notes (Signed)
GOALS OF CARE DISCUSSION  The Clinical status was relayed to family in detail. All Sisters and in laws at bedside Patient would NOT want to live on machines, she does not want to suffer  Updated and notified of patients medical condition.    Patient remains unresponsive and will not open eyes to command.   Patient is having a weak cough and struggling to remove secretions.   Patient with increased WOB and using accessory muscles to breathe Explained to family course of therapy and the modalities  Patient with progressive multiorgan failure-patient is suffering and active dying process The family has met and the patient would not want to live like this-they do not want patient to suffer anymore    Patient with Progressive multiorgan failure with a very high probablity of a very minimal chance of meaningful recovery despite all aggressive and optimal medical therapy.   Family understands the situation.  They have consented and agreed to DNR/DNI and would like to proceed with Comfort care measures.  Family are satisfied with Plan of action and management. All questions answered  Additional CC time 35 mins   Montague Corella Patricia Pesa, M.D.  [facility]  Medical Director [place] Director [facility] Cardio-Pulmonary Department

## 2020-10-29 NOTE — Progress Notes (Signed)
[facility]  Daily Progress Note   Subjective  -   Patient remains critically ill.  In ventricular tachycardia, not responsive, on pressors, and on the ventilator.  Patient also febrile.  Patient not responsive.  Objective Vitals:   2020/11/20 0900 11-20-20 1000 11/20/2020 1100 11-20-20 1200  BP: (!) 137/95 (!) 151/72 (!) 146/66 123/76  Pulse: 97 (!) 137 (!) 140 (!) 161  Resp: (!) 8 (!) 7 (!) 7 (!) 6  Temp: (!) 100.94 F (38.3 C) (!) 101.12 F (38.4 C) (!) 101.48 F (38.6 C) (!) 101.84 F (38.8 C)  TempSrc:      SpO2: 97% 95% 95% 94%  Weight:      Height:        Intake/Output Summary (Last 24 hours) at November 20, 2020 1233 Last data filed at November 20, 2020 1200 Gross per 24 hour  Intake 2459.85 ml  Output 925 ml  Net 1534.85 ml    PULM  Coarse BS on the ventilator CV  tachycardic VASC  right amputation VAC with good seal  Laboratory CBC    Component Value Date/Time   WBC 21.1 (H) 11/20/2020 0522   HGB 8.1 (L) 11-20-2020 0522   HGB 13.1 10/10/2016 1911   HCT 23.5 (L) 20-Nov-2020 0522   HCT 40.6 10/10/2016 1911   PLT 245 11-20-20 0522   PLT 301 10/10/2016 1911    BMET    Component Value Date/Time   NA 137 20-Nov-2020 0522   NA 139 10/10/2016 1911   NA 140 06/03/2014 0405   K 4.4 11-20-2020 0522   K 4.1 06/03/2014 0405   CL 104 20-Nov-2020 0522   CL 107 06/03/2014 0405   CO2 27 2020/11/20 0522   CO2 26 06/03/2014 0405   GLUCOSE 175 (H) 11/20/20 0522   GLUCOSE 227 (H) 06/03/2014 0405   BUN 57 (H) 2020-11-20 0522   BUN 16 10/10/2016 1911   BUN 13 06/03/2014 0405   CREATININE 3.24 (H) November 20, 2020 0522   CREATININE 1.13 06/03/2014 0405   CALCIUM 7.1 (L) 2020-11-20 0522   CALCIUM 8.1 (L) 06/03/2014 0405   GFRNONAA 15 (L) 11-20-2020 0522   GFRNONAA 52 (L) 06/03/2014 0405   GFRNONAA 54 (L) 11/28/2013 1754   GFRAA 51 (L) 02/02/2020 1110   GFRAA >60 06/03/2014 0405   GFRAA >60 11/28/2013 1754    Assessment/Planning: POD #1 s/p guillotine  amputation of the right foot  VAC with good seal this morning Patient is in multisystem organ failure and likelihood of recovery at this point appears small.   [name]  20-Nov-2020, 12:33 PM

## 2020-10-29 NOTE — Progress Notes (Signed)
Progress Note  Patient Name: Melinda [name] Date of Encounter: 11-Nov-2020  Primary Cardiologist: [place] intubated and sedated. On Levophed. 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 She received IV amiodarone bolus and was started on lidocaine gtt overnight for possible VT.   Inpatient Medications    Scheduled Meds:  acetaminophen  650 mg Per Tube Q4H   Or   acetaminophen (TYLENOL) oral liquid 160 mg/5 mL  650 mg Per Tube Q4H   Or   acetaminophen  650 mg Rectal Q4H   aspirin  81 mg Per Tube Daily   atorvastatin  80 mg Per Tube QHS   chlorhexidine gluconate (MEDLINE KIT)  15 mL Mouth Rinse BID   Chlorhexidine Gluconate Cloth  6 each Topical Daily   cholecalciferol  2,000 Units Per Tube Daily   clopidogrel  75 mg Per Tube Daily   docusate  100 mg Per Tube BID   feeding supplement (PIVOT 1.5 CAL)  1,000 mL Per Tube Q24H   feeding supplement (PROSource TF)  45 mL Per Tube QID   free water  30 mL Per Tube Q4H   insulin aspart  0-15 Units Subcutaneous Q4H   insulin aspart  3 Units Subcutaneous Q4H   insulin glargine  6 Units Subcutaneous QHS   ipratropium-albuterol  3 mL Nebulization Q4H   mouth rinse  15 mL Mouth Rinse 10 times per day   multivitamin with minerals  1 tablet Per Tube Daily   nutrition supplement (JUVEN)  1 packet Per Tube BID BM   pantoprazole (PROTONIX) IV  40 mg Intravenous QHS   polyethylene glycol  17 g Per Tube Daily   Continuous Infusions:  amiodarone 30 mg/hr (11-11-2020 0700)   amiodarone     fentaNYL infusion INTRAVENOUS 200 mcg/hr (2020-11-11 0700)   norepinephrine (LEVOPHED) Adult infusion 30 mcg/min (11-11-20 0700)   piperacillin-tazobactam (ZOSYN)  IV 12.5 mL/hr at 2020-11-11 0700   sodium chloride     vasopressin     PRN Meds: acetaminophen **OR** acetaminophen, HYDROmorphone (DILAUDID) injection, lip balm, midazolam, povidone-Iodine   Vital Signs    Vitals:   11-11-20 0400 11-11-20 0500  11/11/2020 0700 11/11/2020 0800  BP: (!) 113/49 (!) 141/52 (!) 128/49 (!) 120/58  Pulse: (!) 108 (!) 106 (!) 108 (!) 104  Resp: (!) 4 (!) 0 (!) 0 (!) 0  Temp: (!) 100.6 F (38.1 C) 100 F (37.8 C) (!) 100.58 F (38.1 C) (!) 100.76 F (38.2 C)  TempSrc:      SpO2: 95% 97% 97% 96%  Weight:      Height:        Intake/Output Summary (Last 24 hours) at 11/11/20 0836 Last data filed at 11-Nov-2020 0700 Gross per 24 hour  Intake 3996.86 ml  Output 900 ml  Net 3096.86 ml    Filed Weights   10/05/2020 1416 10/11/2020 0801  Weight: 110.7 kg 110.7 kg    Telemetry    SR with runs of atrial tach and PVCs with underlying RBBB, no evidence of VT - Personally Reviewed  ECG    No new tracings - Personally Reviewed  Physical Exam   GEN: No acute distress, ill appearing.   Neck: No JVD. Cardiac: RRR, I/VI systolic murmur, no rubs, or gallops.  Respiratory: Vented breath sounds bilaterally.  GI: Soft, nontender, non-distended.   MS: No edema; No deformity. Status post right BKA.  Neuro:  Intubated and sedated.  Psych: Intubated  and sedated.  Labs    Chemistry Recent Labs  Lab 10/16/2020 1419 10/20/20 0930 10/24/20 0430 10/28/2020 0452 2020/11/18 0522  NA 138   < > 139 138 137  K 2.5*   < > 3.4* 4.0 4.4  CL 98   < > 105 104 104  CO2 31   < > _0 GLUCOSE 128*   < > 175* 247* 175*  BUN 11   < > 42* 47* 57*  CREATININE 1.13*   < > 2.58* 2.97* 3.24*  CALCIUM 7.7*   < > 7.1* 7.1* 7.1*  PROT 6.1*  --   --   --   --   ALBUMIN 2.1*  --   --   --   --   AST 19  --   --   --   --   ALT 17  --   --   --   --   ALKPHOS 140*  --   --   --   --   BILITOT 1.5*  --   --   --   --   GFRNONAA 53*   < > 20* 17* 15*  ANIONGAP 9   < > _1 < > = values in this interval not displayed.      Hematology Recent Labs  Lab 10/24/20 0430 10/14/2020 0452 18-Nov-2020 0522  WBC 21.5* 22.2* 21.1*  RBC 3.31* 3.25* 3.11*  HGB 8.4* 8.4* 8.1*  HCT 24.5* 23.9* 23.5*  MCV 74.0* 73.5* 75.6*  MCH  25.4* 25.8* 26.0  MCHC 34.3 35.1 34.5  RDW 18.8* 19.3* 20.1*  PLT 328 304 245     Cardiac EnzymesNo results for input(s): TROPONINI in the last 168 hours. No results for input(s): TROPIPOC in the last 168 hours.   BNPNo results for input(s): BNP, PROBNP in the last 168 hours.   DDimer No results for input(s): DDIMER in the last 168 hours.   Radiology    DG Chest Port 1 View  Result Date: 11/18/2020 IMPRESSION: Stable support lines and tubes. Stable pulmonary insufflation. Stable diffuse interstitial infiltrate, edema versus infection. Mild cardiomegaly. Electronically Signed   By: Fidela Salisbury MD   On: 11-18-20 04:37    Cardiac Studies   2D echo 09/29/2020:  1. Left ventricular ejection fraction, by estimation, is 45%. The left  ventricle has mild to moderately decreased function. The left ventricle  demonstrates global hypokinesis. There is moderate left ventricular  hypertrophy. Left ventricular diastolic  parameters are consistent with Grade II diastolic dysfunction  (pseudonormalization). Elevated left atrial pressure. The average left  ventricular global longitudinal strain is -12.2 %. The global longitudinal  strain is abnormal.   2. Pulmonary artery pressure is at least mildly to moderately elevated  (RVSP 35-40 mmHg plus central venous pressure). Right ventricular systolic  function is moderately reduced. The right ventricular size is mildly  enlarged.   3. Left atrial size was moderately dilated.   4. Right atrial size was mildly dilated.   5. The mitral valve is normal in structure. Mild mitral valve  regurgitation. No evidence of mitral stenosis.   6. Tricuspid valve regurgitation is mild to moderate.   7. The aortic valve has an indeterminant number of cusps. There is mild  calcification of the aortic valve. There is mild thickening of the aortic  valve. Aortic valve regurgitation is not visualized. Mild aortic valve  sclerosis is present, with no  evidence  of aortic valve stenosis.  __________   2D echo 08/12/2020: 1. Left ventricular ejection fraction, by estimation, is 30 to 35%. The  left ventricle has moderately decreased function. The left ventricle  demonstrates global hypokinesis. Left ventricular diastolic parameters are  indeterminate. The average left  ventricular global longitudinal strain is -12.5 %. The global longitudinal  strain is abnormal.   2. Right ventricular systolic function is normal. The right ventricular  size is normal. There is normal pulmonary artery systolic pressure.   3. Left atrial size was moderately dilated.   4. The mitral valve is normal in structure. Mild mitral valve  regurgitation. __________   TEE 04/14/2020: 1. Left ventricular ejection fraction, by estimation, is 50 to 55%. The  left ventricle has low normal function. The left ventricle has no regional  wall motion abnormalities.   2. Right ventricular systolic function is normal. The right ventricular  size is normal.   3. No left atrial/left atrial appendage thrombus was detected.   4. The mitral valve is normal in structure. Mild to moderate mitral valve  regurgitation. No evidence of mitral stenosis.   5. The aortic valve is normal in structure. Aortic valve regurgitation is  not visualized. No aortic stenosis is present.   6. There is mild (Grade II) plaque involving the transverse aorta and  descending aorta.   7. The inferior vena cava is normal in size with greater than 50%  respiratory variability, suggesting right atrial pressure of 3 mmHg.   8. Agitated saline contrast bubble study was negative, with no evidence  of any interatrial shunt.   Conclusion(s)/Recommendation(s): Normal biventricular function without  evidence of hemodynamically significant valvular heart disease. __________   2D echo 03/02/2020:  1. Left ventricular ejection fraction, by estimation, is 35 to 40%. The  left ventricle has moderately decreased function.  The left ventricle  demonstrates regional wall motion abnormalities (see scoring  diagram/findings for description). Left ventricular   diastolic parameters are consistent with Grade I diastolic dysfunction  (impaired relaxation). There is moderate hypokinesis of the left  ventricular, basal-mid inferior wall and inferolateral wall.   2. Right ventricular systolic function is mildly reduced. The right  ventricular size is normal. Tricuspid regurgitation signal is inadequate  for assessing PA pressure.   3. Left atrial size was mildly dilated.   4. Right atrial size was mildly dilated.   5. The mitral valve is normal in structure. Trivial mitral valve  regurgitation. No evidence of mitral stenosis. Moderate mitral annular  calcification.   6. The aortic valve is tricuspid. There is mild calcification of the  aortic valve. There is mild thickening of the aortic valve. Aortic valve  regurgitation is not visualized. Mild to moderate aortic valve  sclerosis/calcification is present, without any  evidence of aortic stenosis.   7. The inferior vena cava is normal in size with <50% respiratory  variability, suggesting right atrial pressure of 8 mmHg. __________   2D echo 06/04/2014: - Left ventricle: The cavity size was normal. There was moderate    concentric hypertrophy. Systolic function was mildly to    moderately reduced. The estimated ejection fraction was in the    range of 40% to 45%. Severe hypokinesis of the    entireinferolateral and inferior myocardium; consistent with    infarction in the distribution of the right coronary or left    circumflex coronary artery. Features are consistent with a    pseudonormal left ventricular filling pattern, with concomitant    abnormal relaxation and increased filling  pressure (grade 2    diastolic dysfunction).  - Mitral valve: There was mild regurgitation.  Patient Profile     65 y.o. female with history of CAD with NSTEMI s/p 4-vessel CABG  in 2016 (LIMA to LAD, SVG to diagonal, SVG to OM, and SVG to PDA), HFrEF secondary to ICM, atrial flutter diagnosed in 03/2020 status post DCCV x2 most recently on 05/28/2020 on amiodarone, COVID infection in 03/2020, PAD status post multiple interventions followed by vascular surgery complicated by gangrene of the left first toe status post partial amputation, chronic left foot wound followed at the wound clinic, IDDM, CKD stage III, HTN, HLD, tobacco use, obesity, and anxiety who is being seen today for the evaluation of sustained WCT.  Assessment & Plan    1. Cardiac arrest/WCT: -Likely in the setting of respiratory arrest and septic shock, though cannot exclude scar-mediate VT -No further episodes of VT -Review of telemetry from overnight consistent with SR with atrial tach, PVCs and underlying RBBB -Stop lidocaine  - Continue IV amiodarone  -Plan for cardiac cath when respiratory and renal function improve -Maintain potassium and magnesium at goal 4.0 and 2.0, respectively   2. CAD involving the native coronary arteries s/p CABG with elevated HS-Tn: -Minimal troponin elevation peaking at 53, not consistent with ACS and likely supply demand ischemia in the setting of the above in combination with her acute illness and comorbid conditions  -ASA, Plavix, Lipitor -Will need cath when able as above -Initiate beta blocker when able  3. PAF: -Brief and noted after ROSC -Maintaining sinus rhythm -Amiodarone -High risk for recurrent arrhythmia  -Heparin gtt held with bleeding from ET tube, risks outweigh benefits at this time  4. HFrEF secondary to ICM: -Echo this admissions showed a slightly improved LVSF as above -Resume GDMT as able, no longer requiring vasopressor support -Plan for LHC when able as above  5. PAD: -s/p mechanical thrombectomy, angioplasty of right popliteal artery -ASA, Plavix, Lipitor -Per vascular surgery  6. Acute hypoxic respiratory  failure: -Intubated -CCM  7. Hypokalemia: -Repleted  8. AKI: -Renal function continues to decline -Per primary service   9. Anemia: -Stable  For questions or updates, please contact [place] Please consult [email or web address] for contact info under Cardiology/STEMI.    Signed, Christell Faith, [name] [place] Pager: [phone] 30-Oct-2020, 8:36 AM

## 2020-10-29 NOTE — Death Summary Note (Signed)
DEATH SUMMARY   Patient Details  Name: Melinda [name] MRN: [id number] DOB: 1955-11-11  Admission/Discharge Information   Admit Date:  11-05-2020  Date of Death: Date of Death: 2020/11/12  Time of Death: Time of Death: 07-13-1602  Length of Stay: 06-23-22  Referring Physician: Donnie Coffin, MD   Reason(s) for Hospitalization  RT FOOT GANGRENE  Diagnoses  Preliminary cause of death: RT FOOT ISCHEMIA, GANGRENE, ISCHEMIC CARDIOMYOPATHY Secondary Diagnoses (including complications and co-morbidities):  Principal Problem:   Acute respiratory failure with hypoxia ([place]) Active Problems:   Essential hypertension   S/P CABG x 4   Obesity   Hyperglycemia   Amputated toe, left (HCC)   Amputated toe, right (HCC)   S/P gastric bypass   CKD (chronic kidney disease), stage IIIa   Depression   AF (paroxysmal atrial fibrillation) (HCC)   Wound infection   Sepsis ([place])   Acute osteomyelitis of calcaneum, right (HCC)   HFrEF (heart failure with reduced ejection fraction) ([place])   Wide-complex tachycardia [facility])   Brief Hospital Course (including significant findings, care, treatment, and services provided and events leading to death)  65 year old female with CAD s/p CABG, chronic systolic heart failure  EF 30-35%, PVD s/p stents in left SFA, atrial fibrillation, stage III CKD admitted to hospitalist for sepsis secondary to OM of right heel   Underwent right lower extremity angiography on 6/23. Post-procedure no immediate complications however this evening she complained of chest/abdominal pain. She then went into respiratory arrest and code blue called and she was intubated and transferred to the ICU. On arrival to the ICU she was noted to be in ventricular tachycardia without a pulse. CPR was initiated and ROSC was achieved after 5 minutes. She received epi, sodium bicarb, amiodarone. Levophed was started for hypotension. She then developed bradycardia and received atropine 1 mg with some response. She  continued to have intermittent episodes of bradycardia so epi gtt was added.   Pertinent  Medical History  CAD s/p CABG, chronic systolic heart failure EF 30-35%, PVD s/p stents in left SFA, atrial fibrillation, stage III CKD   Significant Hospital Events: Including procedures, antibiotic start and stop dates in addition to other pertinent events  Nov 06, 2022 6/23: Suffered respiratory arrest with subsequent V. Tach arrest 6/24: Awake and able to follow commands, weaning down vasopressors, Cardiology consulted 6/26 severe hypoxia, remains on vent 6/27 failed weaning trials, sister updated yesterday 6/27 s/p RT BKA amputation, remains on pressors    The Clinical status was relayed to family in detail. All Sisters and in laws at bedside Patient would NOT want to live on machines, she does not want to suffer   Updated and notified of patients medical condition.       Patient remains unresponsive and will not open eyes to command.   Patient is having a weak cough and struggling to remove secretions.   Patient with increased WOB and using accessory muscles to breathe Explained to family course of therapy and the modalities  Patient with progressive multiorgan failure-patient is suffering and active dying process The family has met and the patient would not want to live like this-they do not want patient to suffer anymore       Patient with Progressive multiorgan failure with a very high probablity of a very minimal chance of meaningful recovery despite all aggressive and optimal medical therapy.   Family understands the situation.   They have consented and agreed to DNR/DNI and would like to proceed with  Comfort care measures.   Family are satisfied with Plan of action and management. All questions answered   Pertinent Labs and Studies  Significant Diagnostic Studies DG Chest 2 View  Result Date: 10/07/2020 CLINICAL DATA:  Chest pain and right leg swelling. EXAM: CHEST - 2 VIEW  COMPARISON:  August 22, 2020 FINDINGS: The heart size and mediastinal contours are stable. Heart size enlarged. Mild atelectasis is identified in bilateral lung bases. There is no pulmonary edema or pleural effusion. The visualized skeletal structures are unremarkable. IMPRESSION: No active cardiopulmonary disease. Electronically Signed   By: Abelardo Diesel M.D.   On: 10/07/2020 19:24   DG Os Calcis Right  Result Date: 10/20/2020 CLINICAL DATA:  Open wound heel EXAM: RIGHT OS CALCIS - 2+ VIEW COMPARISON:  02/18/2019 FINDINGS: Negative for fracture or osteomyelitis. Mild calcaneal spurring.  Mild arterial calcification. IMPRESSION: No acute skeletal abnormality. Electronically Signed   By: Franchot Gallo M.D.   On: 10/24/2020 16:24   DG Abd 1 View  Result Date: 09/30/2020 CLINICAL DATA:  Assess orogastric tube EXAM: ABDOMEN - 1 VIEW COMPARISON:  Earlier today FINDINGS: Enteric tube with tip at the stomach. The side port is likely just below the GE junction. Extensive artifact from support hardware. Minimal coverage of the abdomen showing no gas dilated bowel. Lower chest opacity known from dedicated radiography. IMPRESSION: The enteric tube tip is at the upper stomach. Electronically Signed   By: Monte Fantasia M.D.   On: 10/11/2020 11:52   DG Abd 1 View  Result Date: 10/02/2020 CLINICAL DATA:  OG tube placement EXAM: ABDOMEN - 1 VIEW COMPARISON:  Chest x-ray 10/09/2020 FINDINGS: Limited radiograph of the lower chest and upper abdomen was obtained for the purposes of enteric tube localization. There are numerous overlying monitoring leads. A distinct orogastric tube is not definitively identified. Visualized bowel gas pattern is nonobstructive. IMPRESSION: Numerous overlying monitoring leads a distinct orogastric tube is not definitively identified. A repeat film with inclusion of more of the chest would be helpful to further assess. Electronically Signed   By: Davina Poke D.O.   On: 10/12/2020 10:58    MR ANKLE RIGHT WO CONTRAST  Result Date: 10/20/2020 CLINICAL DATA:  Osteomyelitis suspected, ankle, xray done Necrotic heel ulcer. Suspect osteomyelitis. EXAM: MRI OF THE RIGHT ANKLE WITHOUT CONTRAST TECHNIQUE: Multiplanar, multisequence MR imaging of the ankle was performed. No intravenous contrast was administered. COMPARISON:  Radiograph 10/13/2020 FINDINGS: TENDONS Peroneal: There is focal mild tenosynovitis of the peroneal longus and brevis tendons just below the lateral malleolus. There is intramuscular edema proximally. Posteromedial: Posterior tibial tendon intact. Flexor digitorum longus tendon intact. Flexor hallucis longus tendon intact. Anterior: Tibialis anterior tendon intact. Extensor hallucis longus tendon intact Extensor digitorum longus tendon intact. Achilles:  Intact. Plantar Fascia: Intact. LIGAMENTS Lateral: Anterior talofibular ligament intact. Calcaneofibular ligament intact. Posterior talofibular ligament intact. Anterior and posterior tibiofibular ligaments intact. Medial: Deltoid ligament intact. Spring ligament intact. CARTILAGE Ankle Joint: No joint effusion. Normal ankle mortise. No chondral defect. There is mild midfoot arthritis. Subtalar Joints/Sinus Tarsi: Normal subtalar joints. No subtalar joint effusion. Normal sinus tarsi. Bones: There is bony edema in some associated low T1 signal along the lateral inferior calcaneus in likely cortical disruption. This is underlying a soft tissue ulcer with surrounding edema and subcutaneous gas. Soft Tissue: Lateral hindfoot ulcer as above. There is no well-defined soft tissue abscess. Extensive intramuscular and subcutaneous edema in the foot. IMPRESSION: Lateral hindfoot ulcer with underlying osteomyelitis of the inferolateral calcaneus, subcutaneous soft  tissue gas and edema. No no well-defined soft tissue abscess. Focal mild tenosynovitis of the peroneal longus and brevis tendons just below the lateral malleolus. Electronically  Signed   By: Maurine Simmering   On: 10/20/2020 07:19   PERIPHERAL VASCULAR CATHETERIZATION  Result Date: 10/28/2020 See surgical note for result.  US Venous Img Lower Unilateral Right  Result Date: 10/07/2020 CLINICAL DATA:  Right leg swelling EXAM: RIGHT LOWER EXTREMITY VENOUS DOPPLER ULTRASOUND TECHNIQUE: Gray-scale sonography with compression, as well as color and duplex ultrasound, were performed to evaluate the deep venous system(s) from the level of the common femoral vein through the popliteal and proximal calf veins. COMPARISON:  None. FINDINGS: VENOUS Normal compressibility of the common femoral, superficial femoral, and popliteal veins, as well as the visualized calf veins. Visualized portions of profunda femoral vein and great saphenous vein unremarkable. No filling defects to suggest DVT on grayscale or color Doppler imaging. Doppler waveforms show normal direction of venous flow, normal respiratory plasticity and response to augmentation. Limited views of the contralateral common femoral vein are unremarkable. OTHER Markedly biphasic venous waveform within the common femoral veins bilaterally suggests right heart failure or tricuspid regurgitation. Limitations: none IMPRESSION: No femoropopliteal DVT within the right lower extremity. Biphasic venous waveform within the common femoral vein suggesting right heart failure and/or tricuspid regurgitation. Electronically Signed   By: Fidela Salisbury MD   On: 10/07/2020 21:01   DG Chest Port 1 View  Result Date: Nov 01, 2020 CLINICAL DATA:  Acute respiratory failure EXAM: PORTABLE CHEST 1 VIEW COMPARISON:  10/23/2020 FINDINGS: Endotracheal tube seen 4.5 cm above the carina. Nasogastric tube extends into the abdomen beyond the margin of the examination. Right internal jugular central venous catheter tip within the superior vena cava. Pulmonary insufflation is normal and stable. Superimposed diffuse interstitial infiltrate persists, stable since prior  examination, edema versus infection. No pneumothorax or pleural effusion. Coronary artery bypass grafting has been performed. Mild cardiomegaly is stable. IMPRESSION: Stable support lines and tubes. Stable pulmonary insufflation. Stable diffuse interstitial infiltrate, edema versus infection. Mild cardiomegaly. Electronically Signed   By: Fidela Salisbury MD   On: 2020/11/01 04:37   DG Chest Port 1 View  Result Date: 10/23/2020 CLINICAL DATA:  Acute respiratory failure with hypoxia EXAM: PORTABLE CHEST 1 VIEW COMPARISON:  10/18/2020 FINDINGS: The endotracheal tube tip is 4.3 cm from the carina. Enteric tube is in place with tip below the diaphragm not included in the field of view. Right central line tip overlies the SVC. Stable cardiomegaly post CABG. Worsening bibasilar aeration with otherwise stable diffuse bilateral lung opacities. Possible small pleural effusions. No pneumothorax. Overlying monitoring devices partially obscure detailed assessment IMPRESSION: 1. Worsening bibasilar aeration with otherwise stable diffuse bilateral lung opacities, pulmonary edema versus multifocal pneumonia. Question small pleural effusions. 2. Stable cardiomegaly post CABG. 3. Stable support apparatus. Electronically Signed   By: Keith Rake M.D.   On: 10/23/2020 01:01   DG Chest Port 1 View  Result Date: 09/30/2020 CLINICAL DATA:  Central line, endotracheal and orogastric tube placement. EXAM: PORTABLE CHEST 1 VIEW COMPARISON:  Radiograph yesterday. FINDINGS: Tip of the endotracheal tube is approximately 2.6 cm from the carina, carina is difficult to accurately define. No enteric tube is seen. Right internal jugular central venous catheter tip projects over the SVC. No pneumothorax. Prior median sternotomy. Stable cardiomegaly. Diffuse interstitial opacities with slight worsening at the lung bases from earlier today. There may be small pleural effusions. IMPRESSION: 1. Tip of the endotracheal tube approximately 2.6 cm  from the carina. Right internal jugular central venous catheter tip projects over the SVC. No pneumothorax. 2. Diffuse interstitial opacities with slight worsening at the lung bases from earlier today, suspicious for pulmonary edema. Probable pleural effusions. Stable cardiomegaly post CABG. Electronically Signed   By: Keith Rake M.D.   On: 10/20/2020 21:23   DG Chest Port 1 View  Result Date: 10/20/2020 CLINICAL DATA:  Hypoxia. EXAM: PORTABLE CHEST 1 VIEW COMPARISON:  10/07/2020. FINDINGS: Prior CABG. Cardiomegaly. Diffuse bilateral pulmonary infiltrates/edema. No pleural effusion or pneumothorax. Degenerative change thoracic spine. IMPRESSION: 1.  Prior CABG.  Cardiomegaly. 2.  Diffuse bilateral pulmonary infiltrates/edema. Electronically Signed   By: Marcello Moores  Register   On: 10/20/2020 10:26   EEG adult  Result Date: 10/01/2020 Derek Jack, MD     10/02/2020  3:34 PM Routine EEG Report Katriel Cutsforth is a 65 y.o. female with a history of cardiac arrest who is undergoing an EEG to evaluate for seizures. Report: This EEG was acquired with electrodes placed according to the International 10-20 electrode system (including Fp1, Fp2, F3, F4, C3, C4, P3, P4, O1, O2, T3, T4, T5, T6, A1, A2, Fz, Cz, Pz). The following electrodes were missing or displaced: none. The background was continuous and composed primarily of theta frequencies 5-7 Hz. This activity is reactive to stimulation. Some sleep architecture (K complexes) was noted. There was no focal slowing. There were no interictal epileptiform discharges. There were no electrographic seizures identified. There was no abnormal response to photic stimulation. Hyperventilation was not performed 2/2 patient being on a ventilator. Impression and clinical correlation: This EEG was obtained while awake and asleep and was abnormal due to moderate diffuse slowing. No epileptiform abnormalities were noted during this study. Su Monks, MD [facility] [phone] If 7pm- 7am, please page neurology on call as listed in [place].   ECHOCARDIOGRAM COMPLETE  Result Date: 10/23/2020    ECHOCARDIOGRAM REPORT   Patient Name:   [facility] Date of Exam: 10/27/2020 Medical Rec #:  [id number]          Height:       68.0 in Accession #:    [id number]         Weight:       244.0 lb Date of Birth:  [birth year]          BSA:          2.224 m Patient Age:    34 years           BP:           134/61 mmHg Patient Gender: F                  HR:           80 bpm. Exam Location:  [facility] Procedure: 2D Echo, Cardiac Doppler, Color Doppler and Strain Analysis Indications:     Cardiac Arrest I46.9  History:         Patient has prior history of Echocardiogram examinations, most                  recent 08/12/2020. Previous Myocardial Infarction,                  Arrythmias:Atrial Fibrillation and Atrial Flutter; Risk                  Factors:Hypertension and Diabetes.  Sonographer:     Sherrie Sport RDCS (AE) Referring Phys:  [id number] [facility] Pickle  Diagnosing Phys: Nelva Bush MD  Sonographer Comments: Global longitudinal strain was attempted. IMPRESSIONS  1. Left ventricular ejection fraction, by estimation, is 45%. The left ventricle has mild to moderately decreased function. The left ventricle demonstrates global hypokinesis. There is moderate left ventricular hypertrophy. Left ventricular diastolic parameters are consistent with Grade II diastolic dysfunction (pseudonormalization). Elevated left atrial pressure. The average left ventricular global longitudinal strain is -12.2 %. The global longitudinal strain is abnormal.  2. Pulmonary artery pressure is at least mildly to moderately elevated (RVSP 35-40 mmHg plus central venous pressure). Right ventricular systolic function is moderately reduced. The right ventricular size is mildly enlarged.  3. Left atrial size was moderately dilated.  4. Right atrial size was mildly dilated.  5. The mitral valve is normal in  structure. Mild mitral valve regurgitation. No evidence of mitral stenosis.  6. Tricuspid valve regurgitation is mild to moderate.  7. The aortic valve has an indeterminant number of cusps. There is mild calcification of the aortic valve. There is mild thickening of the aortic valve. Aortic valve regurgitation is not visualized. Mild aortic valve sclerosis is present, with no evidence of aortic valve stenosis. FINDINGS  Left Ventricle: Left ventricular ejection fraction, by estimation, is 45%. The left ventricle has mild to moderately decreased function. The left ventricle demonstrates global hypokinesis. The average left ventricular global longitudinal strain is -12.2  %. The global longitudinal strain is abnormal. The left ventricular internal cavity size was normal in size. There is moderate left ventricular hypertrophy. Left ventricular diastolic parameters are consistent with Grade II diastolic dysfunction (pseudonormalization). Elevated left atrial pressure. Right Ventricle: Pulmonary artery pressure is at least mildly to moderately elevated (RVSP 35-40 mmHg plus central venous pressure). The right ventricular size is mildly enlarged. No increase in right ventricular wall thickness. Right ventricular systolic function is moderately reduced. Left Atrium: Left atrial size was moderately dilated. Right Atrium: Right atrial size was mildly dilated. Pericardium: There is no evidence of pericardial effusion. Mitral Valve: The mitral valve is normal in structure. Mild mitral valve regurgitation. No evidence of mitral valve stenosis. Tricuspid Valve: The tricuspid valve is normal in structure. Tricuspid valve regurgitation is mild to moderate. Aortic Valve: The aortic valve has an indeterminant number of cusps. There is mild calcification of the aortic valve. There is mild thickening of the aortic valve. Aortic valve regurgitation is not visualized. Mild aortic valve sclerosis is present, with  no evidence of aortic  valve stenosis. Aortic valve mean gradient measures 3.0 mmHg. Aortic valve peak gradient measures 5.9 mmHg. Aortic valve area, by VTI measures 2.69 cm. Pulmonic Valve: The pulmonic valve was grossly normal. Pulmonic valve regurgitation is not visualized. No evidence of pulmonic stenosis. Aorta: The aortic root is normal in size and structure. Pulmonary Artery: The pulmonary artery is not well seen. Venous: IVC assessment for right atrial pressure unable to be performed due to mechanical ventilation. IAS/Shunts: The interatrial septum was not well visualized.  LEFT VENTRICLE PLAX 2D LVIDd:         5.11 cm  Diastology LVIDs:         3.65 cm  LV e' medial:    5.55 cm/s LV PW:         1.41 cm  LV E/e' medial:  21.4 LV IVS:        0.98 cm  LV e' lateral:   7.72 cm/s LVOT diam:     2.10 cm  LV E/e' lateral: 15.4 LV SV:  82 LV SV Index:   29       2D Longitudinal Strain LVOT Area:     3.46 cm 2D Strain GLS Avg:     -12.2 %                          3D Volume EF:                         3D EF:        44 %                         LV EDV:       113 ml                         LV ESV:       63 ml                         LV SV:        50 ml RIGHT VENTRICLE RV Basal diam:  4.54 cm RV S prime:     7.29 cm/s TAPSE (M-mode): 1.0 cm LEFT ATRIUM              Index       RIGHT ATRIUM           Index LA diam:        4.90 cm  2.20 cm/m  RA Area:     19.10 cm LA Vol (A2C):   122.0 ml 54.86 ml/m RA Volume:   52.40 ml  23.56 ml/m LA Vol (A4C):   68.2 ml  30.67 ml/m LA Biplane Vol: 92.6 ml  41.64 ml/m  AORTIC VALVE                   PULMONIC VALVE AV Area (Vmax):    2.45 cm    PV Vmax:        0.63 m/s AV Area (Vmean):   2.36 cm    PV Peak grad:   1.6 mmHg AV Area (VTI):     2.69 cm    RVOT Peak grad: 3 mmHg AV Vmax:           121.00 cm/s AV Vmean:          80.900 cm/s AV VTI:            0.243 m AV Peak Grad:      5.9 mmHg AV Mean Grad:      3.0 mmHg LVOT Vmax:         85.50 cm/s LVOT Vmean:        55.200 cm/s LVOT VTI:           0.189 m LVOT/AV VTI ratio: 0.78  AORTA Ao Root diam: 3.07 cm MITRAL VALVE                TRICUSPID VALVE MV Area (PHT): 3.60 cm     TR Peak grad:   36.7 mmHg MV Decel Time: 211 msec     TR Vmax:        303.00 cm/s MV E velocity: 119.00 cm/s MV A velocity: 71.60 cm/s   SHUNTS MV E/A ratio:  1.66         Systemic VTI:  0.19 m  Systemic Diam: 2.10 cm Nelva Bush MD Electronically signed by Nelva Bush MD Signature Date/Time: 10/01/2020/12:55:02 PM    Final     Microbiology Recent Results (from the past 240 hour(s))  Resp Panel by RT-PCR (Flu A&B, Covid) Nasopharyngeal Swab     Status: None   Collection Time: 10/05/2020  4:49 PM   Specimen: Nasopharyngeal Swab; Nasopharyngeal(NP) swabs in vial transport medium  Result Value Ref Range Status   SARS Coronavirus 2 by RT PCR NEGATIVE NEGATIVE Final    Comment: (NOTE) SARS-CoV-2 target nucleic acids are NOT DETECTED.  The SARS-CoV-2 RNA is generally detectable in upper respiratory specimens during the acute phase of infection. The lowest concentration of SARS-CoV-2 viral copies this assay can detect is 138 copies/mL. A negative result does not preclude SARS-Cov-2 infection and should not be used as the sole basis for treatment or other patient management decisions. A negative result may occur with  improper specimen collection/handling, submission of specimen other than nasopharyngeal swab, presence of viral mutation(s) within the areas targeted by this assay, and inadequate number of viral copies(<138 copies/mL). A negative result must be combined with clinical observations, patient history, and epidemiological information. The expected result is Negative.  Fact Sheet for Patients:  EntrepreneurPulse.com.au  Fact Sheet for Healthcare Providers:  IncredibleEmployment.be  This test is no t yet approved or cleared by the [place] FDA and  has been authorized for  detection and/or diagnosis of SARS-CoV-2 by FDA under an Emergency Use Authorization (EUA). This EUA will remain  in effect (meaning this test can be used) for the duration of the COVID-19 declaration under Section 564(b)(1) of the Act, 21 U.S.C.section 360bbb-3(b)(1), unless the authorization is terminated  or revoked sooner.       Influenza A by PCR NEGATIVE NEGATIVE Final   Influenza B by PCR NEGATIVE NEGATIVE Final    Comment: (NOTE) The Xpert Xpress SARS-CoV-2/FLU/RSV plus assay is intended as an aid in the diagnosis of influenza from Nasopharyngeal swab specimens and should not be used as a sole basis for treatment. Nasal washings and aspirates are unacceptable for Xpert Xpress SARS-CoV-2/FLU/RSV testing.  Fact Sheet for Patients: EntrepreneurPulse.com.au  Fact Sheet for Healthcare Providers: IncredibleEmployment.be  This test is not yet approved or cleared by the [place] FDA and has been authorized for detection and/or diagnosis of SARS-CoV-2 by FDA under an Emergency Use Authorization (EUA). This EUA will remain in effect (meaning this test can be used) for the duration of the COVID-19 declaration under Section 564(b)(1) of the Act, 21 U.S.C. section 360bbb-3(b)(1), unless the authorization is terminated or revoked.  Performed at [facility], [address] [place], [place] [address]   Blood Culture (routine x 2)     Status: None   Collection Time: 10/20/2020  4:49 PM   Specimen: BLOOD  Result Value Ref Range Status   Specimen Description BLOOD RIGHT ANTECUBITAL  Final   Special Requests   Final    BOTTLES DRAWN AEROBIC AND ANAEROBIC Blood Culture adequate volume   Culture   Final    NO GROWTH 5 DAYS Performed at [facility], [address], [place], [place] [address]    Report Status 10/24/2020 FINAL  Final  Blood Culture (routine x 2)     Status: None   Collection Time: 10/06/2020  5:15 PM    Specimen: BLOOD  Result Value Ref Range Status   Specimen Description BLOOD LEFT ANTECUBITAL  Final   Special Requests   Final  BOTTLES DRAWN AEROBIC AND ANAEROBIC Blood Culture adequate volume   Culture   Final    NO GROWTH 5 DAYS Performed at [facility], [address] [place], [place] [address]    Report Status 10/24/2020 FINAL  Final  Culture, blood (Routine X 2) w Reflex to ID Panel     Status: None (Preliminary result)   Collection Time: 10/12/2020 10:18 AM   Specimen: BLOOD LEFT HAND  Result Value Ref Range Status   Specimen Description BLOOD LEFT HAND  Final   Special Requests   Final    BOTTLES DRAWN AEROBIC ONLY Blood Culture adequate volume   Culture   Final    NO GROWTH 4 DAYS Performed at [facility], [address] [place], [place] [address]    Report Status PENDING  Incomplete  Aerobic Culture w Gram Stain (superficial specimen)     Status: None   Collection Time: 10/12/2020  4:28 PM   Specimen: Foot; Wound  Result Value Ref Range Status   Specimen Description   Final    FOOT right Performed at [facility], [address], [place], [place] [address]    Special Requests   Final    NONE Performed at [facility], [address] [place], [place] [address]    Gram Stain   Final    NO WBC SEEN FEW GRAM POSITIVE COCCI FEW GRAM NEGATIVE RODS Performed at [facility], [place] [address] [place], [place] [address]    Culture   Final    ABUNDANT [facility] MORGANII FEW ESCHERICHIA COLI    Report Status 10/02/2020 FINAL  Final   Organism ID, Bacteria MORGANELLA MORGANII  Final   Organism ID, Bacteria ESCHERICHIA COLI  Final      Susceptibility   Escherichia coli - MIC*    AMPICILLIN >=32 RESISTANT Resistant     CEFAZOLIN >=64 RESISTANT Resistant     CEFEPIME <=0.12 SENSITIVE Sensitive     CEFTAZIDIME <=1 SENSITIVE Sensitive     CEFTRIAXONE <=0.25 SENSITIVE Sensitive     CIPROFLOXACIN <=0.25 SENSITIVE Sensitive      GENTAMICIN <=1 SENSITIVE Sensitive     IMIPENEM <=0.25 SENSITIVE Sensitive     TRIMETH/SULFA <=20 SENSITIVE Sensitive     AMPICILLIN/SULBACTAM >=32 RESISTANT Resistant     PIP/TAZO <=4 SENSITIVE Sensitive     * FEW ESCHERICHIA COLI   Morganella morganii - MIC*    AMPICILLIN >=32 RESISTANT Resistant     CEFAZOLIN >=64 RESISTANT Resistant     CEFTAZIDIME <=1 SENSITIVE Sensitive     CIPROFLOXACIN <=0.25 SENSITIVE Sensitive     GENTAMICIN <=1 SENSITIVE Sensitive     IMIPENEM 1 SENSITIVE Sensitive     TRIMETH/SULFA <=20 SENSITIVE Sensitive     AMPICILLIN/SULBACTAM 16 INTERMEDIATE Intermediate     PIP/TAZO <=4 SENSITIVE Sensitive     * ABUNDANT MORGANELLA MORGANII  MRSA Next Gen by PCR, Nasal     Status: None   Collection Time: 10/23/20 12:03 PM   Specimen: Nasal Mucosa; Nasal Swab  Result Value Ref Range Status   MRSA by PCR Next Gen NOT DETECTED NOT DETECTED Final    Comment: (NOTE) The GeneXpert MRSA Assay (FDA approved for NASAL specimens only), is one component of a comprehensive MRSA colonization surveillance program. It is not intended to diagnose MRSA infection nor to guide or monitor treatment for MRSA infections. Test performance is not FDA approved in patients less than 32 years old. Performed at [facility], [address] [place], [place] [address]   Culture, blood (Routine X 2) w Reflex to ID Panel     Status: None (Preliminary result)   Collection Time: 10/14/2020 10:52 AM   Specimen: BLOOD  Result Value Ref Range Status   Specimen Description BLOOD LEFT HAND  Final   Special Requests   Final    BOTTLES DRAWN AEROBIC AND ANAEROBIC Blood Culture adequate volume   Culture   Final    NO GROWTH < 24 HOURS Performed at [facility], [address] [place], [place] [address]    Report Status PENDING  Incomplete    Lab Basic Metabolic Panel: Recent Labs  Lab 10/23/2020 0620 10/23/20 0330 10/24/20 0430 10/24/2020 0452 Nov 25, 2020 0522  NA 140  140 139 138 137  K 3.6 3.3* 3.4* 4.0 4.4  CL 105 104 105 104 104  CO2 23 28 27 26 27   GLUCOSE 210* 198* 175* 247* 175*  BUN 22 27* 42* 47* 57*  CREATININE 1.64* 2.07* 2.58* 2.97* 3.24*  CALCIUM 7.6* 7.3* 7.1* 7.1* 7.1*  MG 1.7 2.0 2.0 2.1 2.2  PHOS  --  2.1* 2.1* 3.0 4.8*   Liver Function Tests: No results for input(s): AST, ALT, ALKPHOS, BILITOT, PROT, ALBUMIN in the last 168 hours. No results for input(s): LIPASE, AMYLASE in the last 168 hours. No results for input(s): AMMONIA in the last 168 hours. CBC: Recent Labs  Lab 10/15/2020 0620 10/23/20 0330 10/24/20 0430 10/24/2020 0452 11-25-20 0522  WBC 29.0* 19.6* 21.5* 22.2* 21.1*  HGB 8.8* 8.8* 8.4* 8.4* 8.1*  HCT 25.0* 24.4* 24.5* 23.9* 23.5*  MCV 73.5* 72.8* 74.0* 73.5* 75.6*  PLT 345 339 328 304 245   Cardiac Enzymes: No results for input(s): CKTOTAL, CKMB, CKMBINDEX, TROPONINI in the last 168 hours. Sepsis Labs: Recent Labs  Lab 10/20/20 0930 10/17/2020 0519 09/29/2020 1018 10/13/2020 1303 10/11/2020 1505 10/23/20 0330 10/24/20 0430 10/07/2020 0452 11/25/2020 0522  PROCALCITON <0.10  --  2.80  --   --  3.85 4.45  --   --   WBC 15.0*   < >  --   --   --  19.6* 21.5* 22.2* 21.1*  LATICACIDVEN  --    < > 6.0* 2.4* 2.0* 1.8  --   --   --    < > = values in this interval not displayed.    PATIENT PRONOUNCED DEAD AT [place] Nov 25, 2020, 4:36 PM

## 2020-10-29 NOTE — Anesthesia Postprocedure Evaluation (Signed)
Anesthesia Post Note  Patient: Melinda [name]  Procedure(s) Performed: AMPUTATION BELOW KNEE (Right: Knee) MINOR APPLICATION OF WOUND VAC (Right: Leg Lower)  Patient location during evaluation: ICU Anesthesia Type: General Level of consciousness: sedated and patient remains intubated per anesthesia plan Pain management: pain level controlled Vital Signs Assessment: post-procedure vital signs reviewed and stable Respiratory status: patient remains intubated per anesthesia plan and patient on ventilator - see flowsheet for VS Cardiovascular status: stable Postop Assessment: no apparent nausea or vomiting Anesthetic complications: no   No notable events documented.   Last Vitals:  Vitals:   10-28-20 0700 10/28/20 0800  BP: (!) 128/49 (!) 120/58  Pulse: (!) 108 (!) 104  Resp: (!) 0 (!) 0  Temp: (!) 38.1 C (!) 38.2 C  SpO2: 97% 96%    Last Pain:  Vitals:   10/09/2020 1530  TempSrc: Esophageal  PainSc:                  Estill Batten

## 2020-10-29 NOTE — Progress Notes (Signed)
NAME:  Melinda [name], MRN:  [id number], DOB:  [birth year], LOS: 6 ADMISSION DATE:  10/04/2020  History of Present Illness:  65 year old female with CAD s/p CABG, chronic systolic heart failure  EF 30-35%, PVD s/p stents in left SFA, atrial fibrillation, stage III CKD admitted to hospitalist for sepsis secondary to OM of right heel   Underwent right lower extremity angiography on 6/23. Post-procedure no immediate complications however this evening she complained of chest/abdominal pain. She then went into respiratory arrest and code blue called and she was intubated and transferred to the ICU. On arrival to the ICU she was noted to be in ventricular tachycardia without a pulse. CPR was initiated and ROSC was achieved after 5 minutes. She received epi, sodium bicarb, amiodarone. Levophed was started for hypotension. She then developed bradycardia and received atropine 1 mg with some response. She continued to have intermittent episodes of bradycardia so epi gtt was added.   Pertinent  Medical History  CAD s/p CABG, chronic systolic heart failure EF 30-35%, PVD s/p stents in left SFA, atrial fibrillation, stage III CKD   Significant Hospital Events: Including procedures, antibiotic start and stop dates in addition to other pertinent events  6/21 6/23: Suffered respiratory arrest with subsequent V. Tach arrest 6/24: Awake and able to follow commands, weaning down vasopressors, Cardiology consulted 6/26 severe hypoxia, remains on vent 6/27 failed weaning trials, sister updated yesterday 6/27 s/p RT BKA amputation, remains on pressors  Antibiotics Given (last 72 hours)     Date/Time Action Medication Dose Rate   10/23/20 1401 New Bag/Given   Ampicillin-Sulbactam (UNASYN) 3 g in sodium chloride 0.9 % 100 mL IVPB 3 g 200 mL/hr   10/23/20 1827 New Bag/Given   Ampicillin-Sulbactam (UNASYN) 3 g in sodium chloride 0.9 % 100 mL IVPB 3 g 200 mL/hr   10/23/20 2233 New Bag/Given   vancomycin  (VANCOREADY) IVPB 1500 mg/300 mL 1,500 mg 150 mL/hr   10/24/20 0022 New Bag/Given   Ampicillin-Sulbactam (UNASYN) 3 g in sodium chloride 0.9 % 100 mL IVPB 3 g 200 mL/hr   10/24/20 2979 New Bag/Given   Ampicillin-Sulbactam (UNASYN) 3 g in sodium chloride 0.9 % 100 mL IVPB 3 g 200 mL/hr   10/24/20 1152 New Bag/Given   piperacillin-tazobactam (ZOSYN) IVPB 3.375 g 3.375 g 12.5 mL/hr   10/24/20 2231 New Bag/Given   piperacillin-tazobactam (ZOSYN) IVPB 3.375 g 3.375 g 12.5 mL/hr   10/11/2020 0625 New Bag/Given   piperacillin-tazobactam (ZOSYN) IVPB 3.375 g 3.375 g 12.5 mL/hr   10/14/2020 1511 New Bag/Given   piperacillin-tazobactam (ZOSYN) IVPB 3.375 g 3.375 g 12.5 mL/hr   10/01/2020 2234 New Bag/Given   piperacillin-tazobactam (ZOSYN) IVPB 3.375 g 3.375 g 12.5 mL/hr   2020-10-28 0556 New Bag/Given   piperacillin-tazobactam (ZOSYN) IVPB 3.375 g 3.375 g 12.5 mL/hr        Interim History / Subjective:  Remains on vent Critically ill Multiorgan failure RT BKA Severe septic shock and cardiogenic shock SVT with aberency    Intake/Output Summary (Last 24 hours) at 28-Oct-2020 0745 Last data filed at 10/18/2020 1624 Gross per 24 hour  Intake 2344.96 ml  Output 900 ml  Net 1444.96 ml      CMP Latest Ref Rng & Units 28-Oct-2020 10/20/2020 10/24/2020  Glucose 70 - 99 mg/dL 175(H) 247(H) 175(H)  BUN 8 - 23 mg/dL 57(H) 47(H) 42(H)  Creatinine 0.44 - 1.00 mg/dL 3.24(H) 2.97(H) 2.58(H)  Sodium 135 - 145 mmol/L 137 138 139  Potassium 3.5 -  5.1 mmol/L 4.4 4.0 3.4(L)  Chloride 98 - 111 mmol/L 104 104 105  CO2 22 - 32 mmol/L 27 26 27   Calcium 8.9 - 10.3 mg/dL 7.1(L) 7.1(L) 7.1(L)  Total Protein 6.5 - 8.1 g/dL - - -  Total Bilirubin 0.3 - 1.2 mg/dL - - -  Alkaline Phos 38 - 126 U/L - - -  AST 15 - 41 U/L - - -  ALT 0 - 44 U/L - - -          Objective   Blood pressure (!) 141/52, pulse (!) 106, temperature 100 F (37.8 C), resp. rate (!) 0, height 5\' 8"  (1.727 m), weight 110.7 kg, SpO2 97  %.    Vent Mode: PCV FiO2 (%):  [35 %-70 %] 70 % Set Rate:  [10 bmp-18 bmp] 18 bmp Vt Set:  [500 mL] 500 mL PEEP:  [10 ZCH88-50 cmH20] 10 cmH20 Plateau Pressure:  [16 cmH20-22 cmH20] 22 cmH20   Intake/Output Summary (Last 24 hours) at October 28, 2020 0744 Last data filed at 10/04/2020 1624 Gross per 24 hour  Intake 2344.96 ml  Output 900 ml  Net 1444.96 ml    Filed Weights   10/08/2020 1416 10/18/2020 0801  Weight: 110.7 kg 110.7 kg    REVIEW OF SYSTEMS  PATIENT IS UNABLE TO PROVIDE COMPLETE REVIEW OF SYSTEMS DUE TO SEVERE CRITICAL ILLNESS AND TOXIC METABOLIC ENCEPHALOPATHY  PHYSICAL EXAMINATION:  GENERAL:critically ill appearing, +resp distress HEAD: Normocephalic, atraumatic.  EYES: Pupils equal, round, reactive to light.  No scleral icterus.  MOUTH: Moist mucosal membrane. NECK: Supple. No thyromegaly. No nodules. No JVD.  PULMONARY: +rhonchi, +wheezing CARDIOVASCULAR: S1 and S2. Regular rate and rhythm. No murmurs, rubs, or gallops.  GASTROINTESTINAL: Soft, nontender, -distended. Positive bowel sounds.  MUSCULOSKELETAL: RT BKA NEUROLOGIC: obtunded SKIN:RT BKA    Labs/imaging that I havepersonally reviewed  (right click and "Reselect all SmartList Selections" daily)       ASSESSMENT AND PLAN SYNOPSIS  65 yo morbidly obese AAF with acute and severe ischemic cardiomyopathy with acute and severe Cardiogenic/septic shock with severe metabolic encephalopathy with severe acute systolic heart failure source of infection/inflammation RT foot ulcer/gangrene  Severe ACUTE Hypoxic and Hypercapnic Respiratory Failure -continue Mechanical Ventilator support -continue Bronchodilator Therapy -Wean Fio2 and PEEP as tolerated -VAP/VENT bundle implementation Unable to wean from vent Severe shock and severe hypoxia  ACUTE SYSTOLIC CARDIAC FAILURE-  -oxygen as needed SVT with abberrency Follow up cardiology recs    CARDIAC ICU monitoring  ACUTE KIDNEY INJURY/Renal  Failure -continue Foley Catheter-assess need -Avoid nephrotoxic agents -Follow urine output, BMP -Ensure adequate renal perfusion, optimize oxygenation -Renal dose medications Nephrology consulted   NEUROLOGY ACUTE TOXIC METABOLIC ENCEPHALOPATHY -need for sedation -Goal RASS -2 to -3  Septic /cardiogenic shock -use vasopressors to keep MAP>65 -follow ABG and LA -follow up cultures -emperic ABX -consider stress dose steroids -aggressive IV fluid Resuscitation RT heel ulcer and gangrene s/p amputation  INFECTIOUS DISEASE -continue antibiotics as prescribed -follow up cultures -follow up ID consultation  ENDO - ICU hypoglycemic\Hyperglycemia protocol -check FSBS per protocol    GI GI PROPHYLAXIS as indicated  NUTRITIONAL STATUS DIET-->TF's as tolerated Constipation protocol as indicated  ELECTROLYTES -follow labs as needed -replace as needed -pharmacy consultation and following    Best Practice (right click and "Reselect all SmartList Selections" daily)    Diet/type: NPO, start tube feeds Pain/Anxiety/Delirium protocol RASS goal: 0 to -1 VAP protocol (if indicated): Yes DVT prophylaxis: systemic heparin GI prophylaxis: PPI Glucose control:  SSI  Central venous access:  Yes, and it is still needed Arterial line:  Yes, and it is still needed Foley:  Yes, and it is still needed Mobility:  bed rest  PT consulted: N/A Studies pending: Echocardiogram Culture data pending:blood Last reviewed culture data:today Antibiotics:unasyn and vanc Antibiotic de-escalation: no,  continue current rx Stop date: to be determined  Code Status:  full code Last date of multidisciplinary goals of care discussion [6/24] ccm prognosis: Life-threating Disposition: remains critically ill, will stay in intensive care    Labs   CBC: Recent Labs  Lab 10/27/2020 1419 10/20/20 0930 10/28/2020 0620 10/23/20 0330 10/24/20 0430 09/29/2020 0452 11-22-2020 0522  WBC 14.7*   < >  29.0* 19.6* 21.5* 22.2* 21.1*  NEUTROABS 12.0*  --   --   --   --   --   --   HGB 10.0*   < > 8.8* 8.8* 8.4* 8.4* 8.1*  HCT 29.8*   < > 25.0* 24.4* 24.5* 23.9* 23.5*  MCV 77.2*   < > 73.5* 72.8* 74.0* 73.5* 75.6*  PLT 351   < > 345 339 328 304 245   < > = values in this interval not displayed.     Basic Metabolic Panel: Recent Labs  Lab 10/23/2020 0620 10/23/20 0330 10/24/20 0430 10/07/2020 0452 November 22, 2020 0522  NA 140 140 139 138 137  K 3.6 3.3* 3.4* 4.0 4.4  CL 105 104 105 104 104  CO2 23 28 27 26 27   GLUCOSE 210* 198* 175* 247* 175*  BUN 22 27* 42* 47* 57*  CREATININE 1.64* 2.07* 2.58* 2.97* 3.24*  CALCIUM 7.6* 7.3* 7.1* 7.1* 7.1*  MG 1.7 2.0 2.0 2.1 2.2  PHOS  --  2.1* 2.1* 3.0 4.8*    GFR: Estimated Creatinine Clearance: 22 mL/min (A) (by C-G formula based on SCr of 3.24 mg/dL (H)). Recent Labs  Lab 10/20/20 0930 10/11/2020 0519 10/16/2020 1018 10/12/2020 1303 10/16/2020 1505 10/23/20 0330 10/24/20 0430 10/18/2020 0452 11-22-20 0522  PROCALCITON <0.10  --  2.80  --   --  3.85 4.45  --   --   WBC 15.0*   < >  --   --   --  19.6* 21.5* 22.2* 21.1*  LATICACIDVEN  --    < > 6.0* 2.4* 2.0* 1.8  --   --   --    < > = values in this interval not displayed.     Liver Function Tests: Recent Labs  Lab 10/24/2020 1419  AST 19  ALT 17  ALKPHOS 140*  BILITOT 1.5*  PROT 6.1*  ALBUMIN 2.1*    No results for input(s): LIPASE, AMYLASE in the last 168 hours. No results for input(s): AMMONIA in the last 168 hours.  ABG    Component Value Date/Time   PHART 7.38 10/11/2020 1021   PCO2ART 42 10/12/2020 1021   PO2ART 60 (L) 10/01/2020 1021   HCO3 24.8 10/17/2020 1021   TCO2 22 06/09/2014 1822   ACIDBASEDEF 0.3 10/05/2020 1021   O2SAT 90.0 10/24/2020 1021      Coagulation Profile: Recent Labs  Lab 10/05/2020 1649 10/20/20 0930 10/08/2020 2030  INR 2.0* 1.9* 1.9*     Cardiac Enzymes: No results for input(s): CKTOTAL, CKMB, CKMBINDEX, TROPONINI in the last 168  hours.  HbA1C: Hemoglobin A1C  Date/Time Value Ref Range Status  06/02/2014 12:03 AM 11.5 (H) 4.2 - 6.3 % Final    Comment:    The American Diabetes Association recommends that a primary goal of  therapy should be <7% and that physicians should reevaluate the treatment regimen in patients with HbA1c values consistently >8%.   01/19/2012 01:52 AM 13.5 (H) 4.2 - 6.3 % Final    Comment:    The American Diabetes Association recommends that a primary goal of therapy should be <7% and that physicians should reevaluate the treatment regimen in patients with HbA1c values consistently >8%.    Hgb A1c MFr Bld  Date/Time Value Ref Range Status  10/20/2020 09:24 AM 7.5 (H) 4.8 - 5.6 % Final    Comment:    (NOTE) Pre diabetes:          5.7%-6.4%  Diabetes:              >6.4%  Glycemic control for   <7.0% adults with diabetes   05/26/2020 08:24 AM 8.0 (H) 4.8 - 5.6 % Final    Comment:    (NOTE) Pre diabetes:          5.7%-6.4%  Diabetes:              >6.4%  Glycemic control for   <7.0% adults with diabetes     CBG: Recent Labs  Lab 09/30/2020 1623 10/10/2020 1924 10/04/2020 2335 2020/11/08 0446 11/08/2020 0733  GLUCAP 155* 155* 110* 123* 155*     Allergies No Known Allergies     DVT/GI PRX  assessed I Assessed the need for Labs I Assessed the need for Foley I Assessed the need for Central Venous Line Family Discussion when available I Assessed the need for Mobilization I made an Assessment of medications to be adjusted accordingly Safety Risk assessment completed  CASE DISCUSSED IN MULTIDISCIPLINARY ROUNDS WITH ICU TEAM     Critical Care Time devoted to patient care services described in this note is 50 minutes.  Critical care was necessary to treat /prevent imminent and life-threatening deterioration. Overall, patient is critically ill, prognosis is guarded.  Patient with Multiorgan failure and at high risk for cardiac arrest and death.    Corrin Parker, M.D.   [facility]  Medical Director [place] Director [facility] Cardio-Pulmonary Department

## 2020-10-29 NOTE — Progress Notes (Signed)
Pt continues to have runs of of V. Tach despite Amiodarone drip, and is refractory to 150 mg Amiodarone bolus given this morning.  Pt continues to have pulse and maintaining BP with Levophed gtt.  Will give Lidocaine bolus and start Lidocaine gtt.  Repeat electrolytes and HS Troponin are pending.    Darel Hong, AGACNP-BC [name] Prefer epic messenger for cross cover needs If after hours, please call E-link

## 2020-10-29 NOTE — Progress Notes (Signed)
Initial Nutrition Assessment  DOCUMENTATION CODES:  Obesity unspecified  INTERVENTION:  Continue as tolerated: Pivot 1.5 @40ml /hr + ProSource 60ml QID via tube  Free water flushes 47ml q4 hours to maintain tube patency  Regimen provides 1600kcal/day, 134g/day protein and 923ml/day free water  Juven Fruit Punch BID via tube, each serving provides 95kcal and 2.5g of protein (amino acids glutamine and arginine) MVI daily via tube  Request new measured weight - s/p right BKA Vitamin C 250mg  BID  NUTRITION DIAGNOSIS:  Inadequate oral intake related to inability to eat (pt sedated and ventilated) as evidenced by NPO status.  GOAL:  Provide needs based on ASPEN/SCCM guidelines  MONITOR:  Vent status, Labs, Weight trends, Skin, I & O's  REASON FOR ASSESSMENT:  Ventilator    ASSESSMENT:  65 y/o female with h/o NSTEMI, CAD, GERD, IDDM, CABG x 4, CKD III, COVID 19, depression, Afib, CHF, PVD, substance abuse, roux-en-y gastric bypass in 01/2020 and chronic foot infections s/o toe amputations who is admitted with sepsis secondary to osteomyelitis from a diabetic foot infection and respiratory failure requiring intubation.  6/23 - Op, R LE angiography and thrombectomy  6/28 - Op, Right BKA with wound vac placement  Pt intubated and sedated at the time of visit. Vascular surgery consulted and after initial evaluation 6/27 took pt emergently to OR for amputation of right lower limb as presence of osteomyelitis was identified as the source of her profound sepsis. Right BKA completed and wound vac placed.   Upon returning to ICU from surgery, pt having cardiac rhythm changes overnight. Pt in shock, requiring escalating pressor support. MAP remains >65. Discussed in IDT rounds, pt remains full code, but is actively dying. End of life visitation in place with family - will possibly transition to comfort care today.  Tube feeds continue to infuse at goal rate. No BM documented since 6/24, bowel  regimen in place. Will continue feeds and monitor for tolerance. Will continue current care and add vitamins to support wound healing. Will also request new weight s/p surgery.   Patient is currently intubated on ventilator support MV: 6 L/min Temp (24hrs), Avg:100.5 F (38.1 C), Min:100 F (37.8 C), Max:101.12 F (38.4 C)   Intake/Output Summary (Last 24 hours) at 11/21/2020 1137 Last data filed at 11-21-2020 0900 Gross per 24 hour  Intake 2227.33 ml  Output 925 ml  Net 1302.33 ml  Net IO Since Admission: 8,842.99 mL [11/21/2020 1137]  Average Meal Intake: 6/21-6/23: 50% intake x 1 recorded meal  Nutritionally Relevant Medications: Scheduled Meds:  atorvastatin  80 mg Per Tube QHS   cholecalciferol  2,000 Units Per Tube Daily   docusate  100 mg Per Tube BID   feeding supplement (PIVOT 1.5 CAL)  1,000 mL Per Tube Q24H   feeding supplement (PROSource TF)  45 mL Per Tube QID   free water  30 mL Per Tube Q4H   insulin aspart  0-15 Units Subcutaneous Q4H   insulin aspart  3 Units Subcutaneous Q4H   insulin glargine  6 Units Subcutaneous QHS   multivitamin with minerals  1 tablet Per Tube Daily   nutrition supplement (JUVEN)  1 packet Per Tube BID BM   pantoprazole (PROTONIX) IV  40 mg Intravenous QHS   polyethylene glycol  17 g Per Tube Daily   Continuous Infusions:  fentaNYL infusion INTRAVENOUS Stopped (11/21/2020 0831)   norepinephrine (LEVOPHED) Adult infusion 50 mcg/min (11/21/20 0900)   piperacillin-tazobactam (ZOSYN)  IV 12.5 mL/hr at November 21, 2020 0900  vasopressin 0.03 Units/min (11-05-20 0900)   Labs Reviewed: BUN 57, creatinine 3.24 (worsening) Phosphorus 4.8 (worsening) SBG ranges from 110-215 mg/dL over the last 24 hours HgbA1c 7.5% (6/22)  NUTRITION - FOCUSED PHYSICAL EXAM: Flowsheet Row Most Recent Value  Orbital Region No depletion  Upper Arm Region No depletion  Thoracic and Lumbar Region No depletion  Buccal Region No depletion  Temple Region No depletion   Clavicle Bone Region No depletion  Clavicle and Acromion Bone Region No depletion  Scapular Bone Region No depletion  Dorsal Hand No depletion  Patellar Region No depletion  Anterior Thigh Region No depletion  Posterior Calf Region No depletion  Edema (RD Assessment) Mild  Hair Reviewed  Eyes Reviewed  Mouth Reviewed  Skin Reviewed  Nails Reviewed   Diet Order:   Diet Order             Diet NPO time specified  Diet effective ____                  EDUCATION NEEDS:  No education needs have been identified at this time  Skin:  Skin Assessment: Skin Integrity Issues: Skin Integrity Issues:: Wound VAC Wound Vac: right leg - BKA  Last BM:  6/24 per RN documentation  Height:  Ht Readings from Last 1 Encounters:  10/17/2020 5\' 8"  (1.727 m)    Weight:  Wt Readings from Last 1 Encounters:  09/30/2020 110.7 kg    Ideal Body Weight:  63.6 kg  BMI:  Body mass index is 37.1 kg/m.  Estimated Nutritional Needs:  Kcal:  1217-1550kcal/day Protein:  >127g/day Fluid:  1.6-1.9L/day  Ranell Patrick, RD, LDN Clinical Dietitian Pager on [place]

## 2020-10-29 NOTE — Anesthesia Postprocedure Evaluation (Signed)
Anesthesia Post Note  Patient: Kiira Brach  Procedure(s) Performed: AMPUTATION BELOW KNEE (Right: Knee) MINOR APPLICATION OF WOUND VAC (Right: Leg Lower)  Patient location during evaluation: SICU Anesthesia Type: General Level of consciousness: sedated Pain management: pain level controlled Vital Signs Assessment: post-procedure vital signs reviewed and stable Respiratory status: patient remains intubated per anesthesia plan Cardiovascular status: stable Postop Assessment: no apparent nausea or vomiting Anesthetic complications: no   No notable events documented.   Last Vitals:  Vitals:   11-20-20 0700 November 20, 2020 0800  BP: (!) 128/49 (!) 120/58  Pulse: (!) 108 (!) 104  Resp: (!) 0 (!) 0  Temp: (!) 38.1 C (!) 38.2 C  SpO2: 97% 96%    Last Pain:  Vitals:   10/07/2020 1530  TempSrc: Esophageal  PainSc:                  Hedda Slade

## 2020-10-29 DEATH — deceased

## 2020-10-30 ENCOUNTER — Other Ambulatory Visit: Payer: Self-pay | Admitting: Cardiovascular Disease

## 2020-10-30 LAB — CULTURE, BLOOD (ROUTINE X 2)
Culture: NO GROWTH
Special Requests: ADEQUATE

## 2020-11-02 LAB — BLOOD GAS, ARTERIAL
Acid-base deficit: 6.2 mmol/L — ABNORMAL HIGH (ref 0.0–2.0)
Bicarbonate: 20.6 mmol/L (ref 20.0–28.0)
FIO2: 1
O2 Saturation: 82.1 %
Patient temperature: 37
pCO2 arterial: 46 mmHg (ref 32.0–48.0)
pH, Arterial: 7.26 — ABNORMAL LOW (ref 7.350–7.450)
pO2, Arterial: 54 mmHg — ABNORMAL LOW (ref 83.0–108.0)

## 2021-01-26 NOTE — Telephone Encounter (Signed)
error 

## 2022-11-07 IMAGING — CT CT ABD-PELV W/ CM
2 of 5 series · 17 of 46 positions shown, 19 images · IV contrast (APPLIED)
Comparison: 09/06/2015

CLINICAL DATA: Abdominal distention

EXAM:
CT ABDOMEN AND PELVIS WITH CONTRAST
TECHNIQUE: Multidetector CT imaging of the abdomen and pelvis was performed
using the standard protocol following bolus administration of
intravenous contrast.
CONTRAST:  75mL OMNIPAQUE IOHEXOL 350 MG/ML SOLN

[Series 2: axial st · axial · 0.94mm/px · z∈[-883,-433]mm · 14 of 102 slices shown, 16 images]
[im 6/102  soft-tissue]
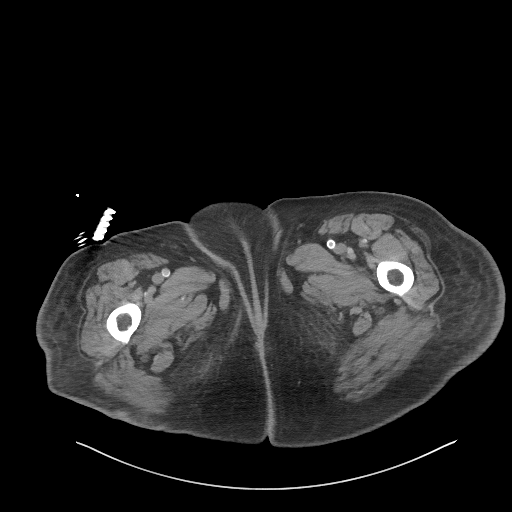
[im 6/102  bone]
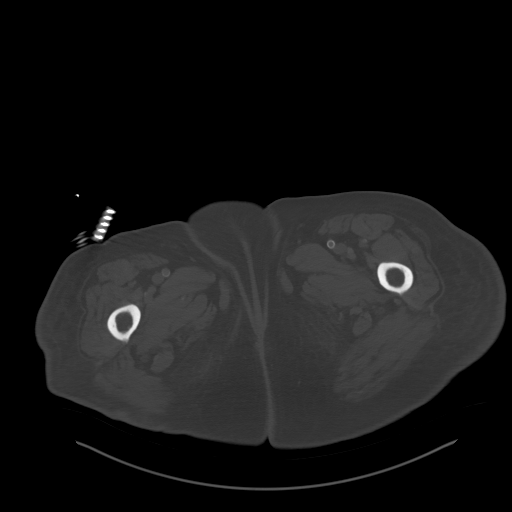
[im 12/102  soft-tissue]
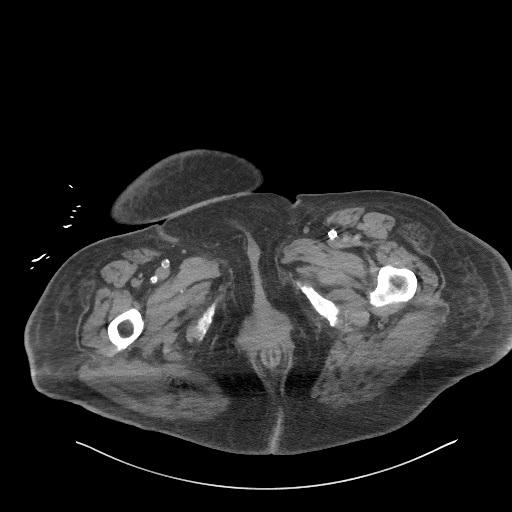
[im 18/102  soft-tissue]
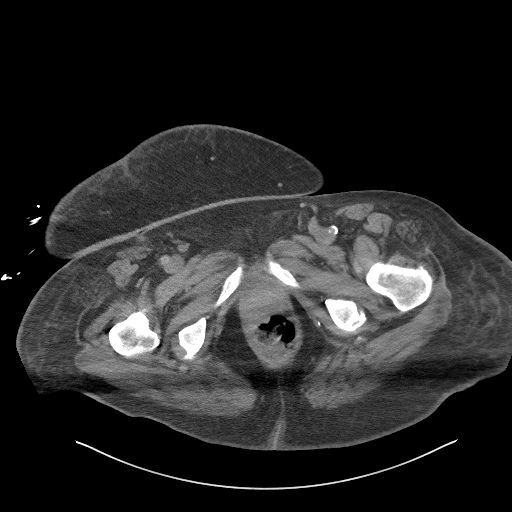
[im 30/102  soft-tissue]
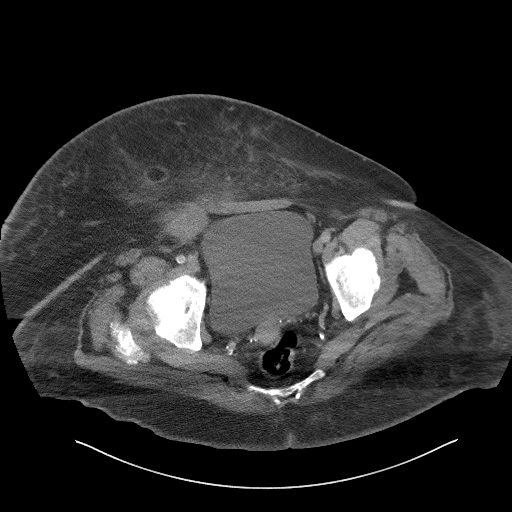
[im 36/102  soft-tissue]
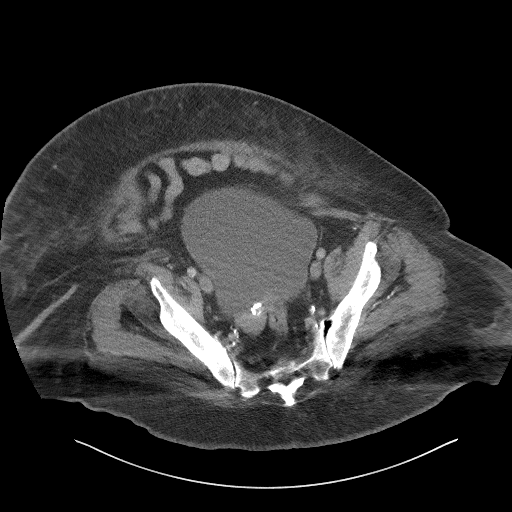
[im 42/102  soft-tissue]
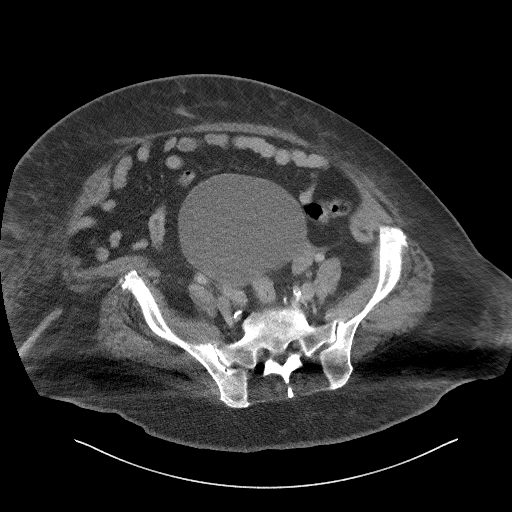
[im 48/102  soft-tissue]
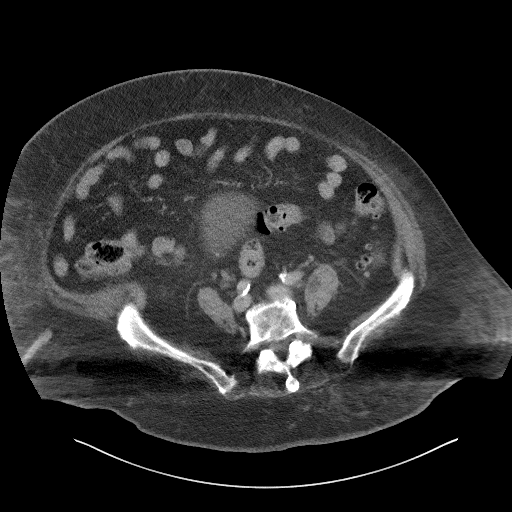
[im 54/102  soft-tissue]
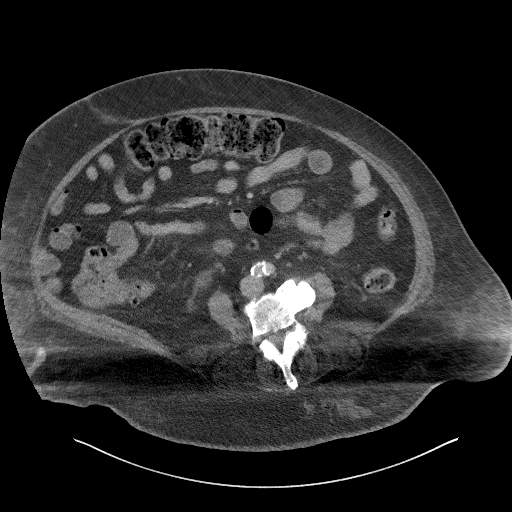
[im 60/102  soft-tissue]
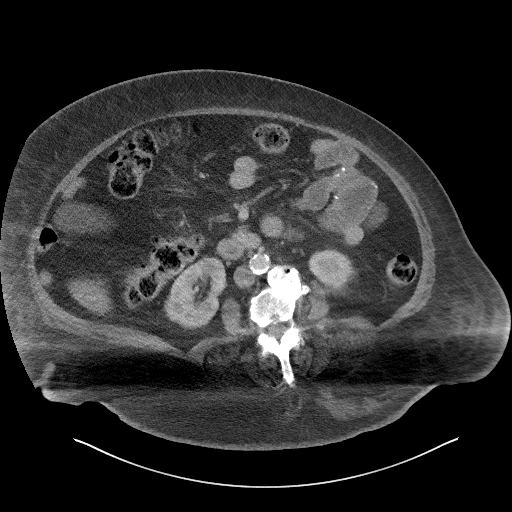
[im 60/102  bone]
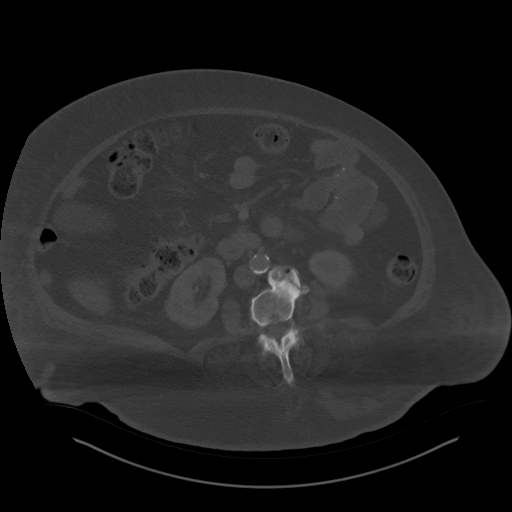
[im 66/102  soft-tissue]
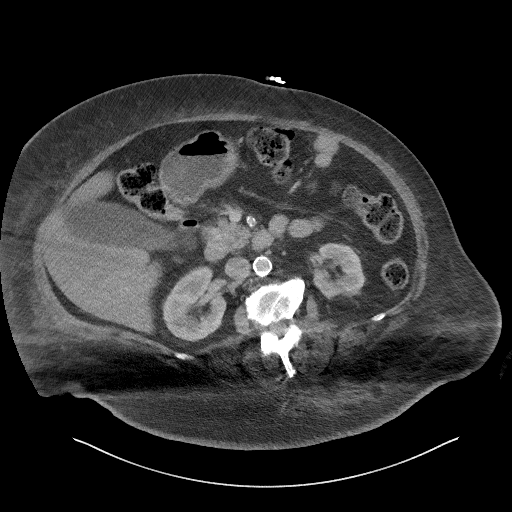
[im 78/102  soft-tissue]
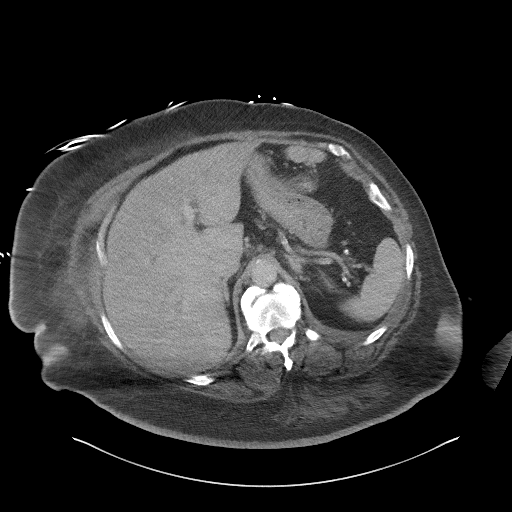
[im 84/102  soft-tissue]
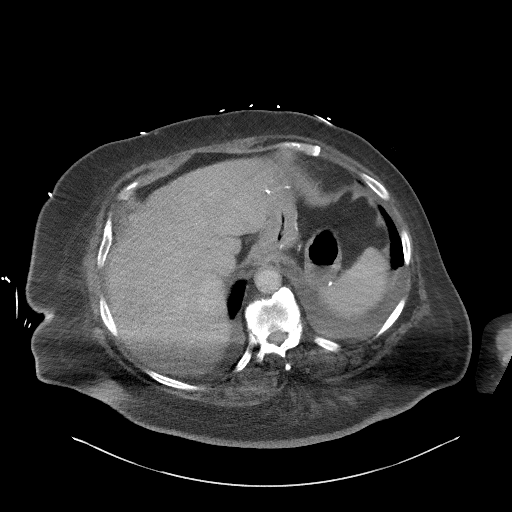
[im 90/102  soft-tissue]
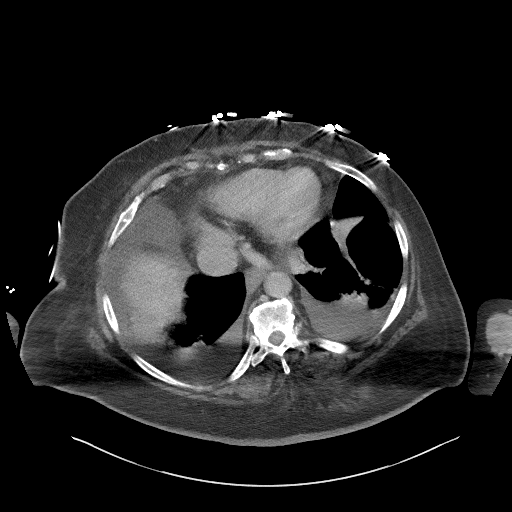
[im 96/102  soft-tissue]
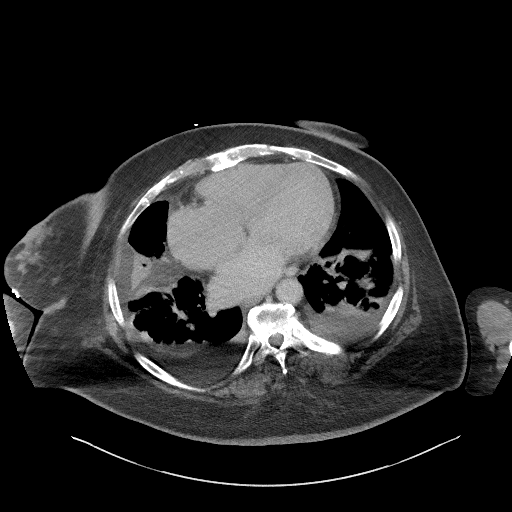

[Series 5: coronal st · coronal · 0.99mm/px · 3 of 127 slices shown]
[im 43/127  soft-tissue]
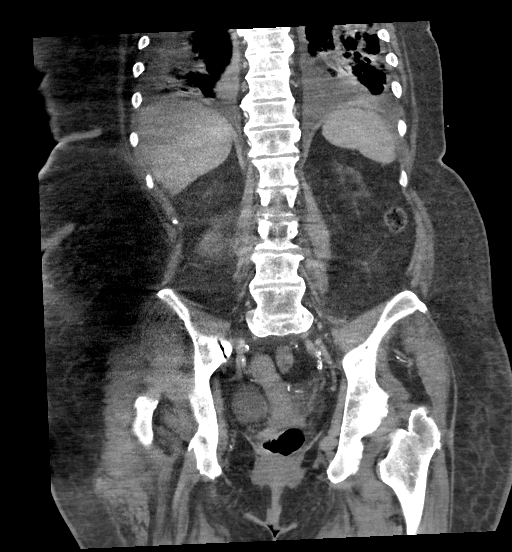
[im 57/127  soft-tissue]
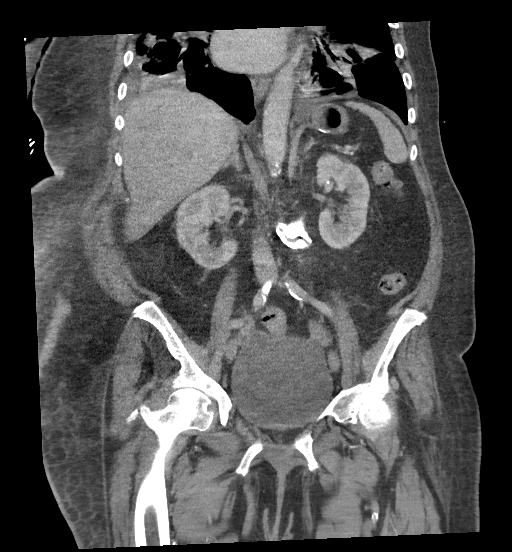
[im 71/127  soft-tissue]
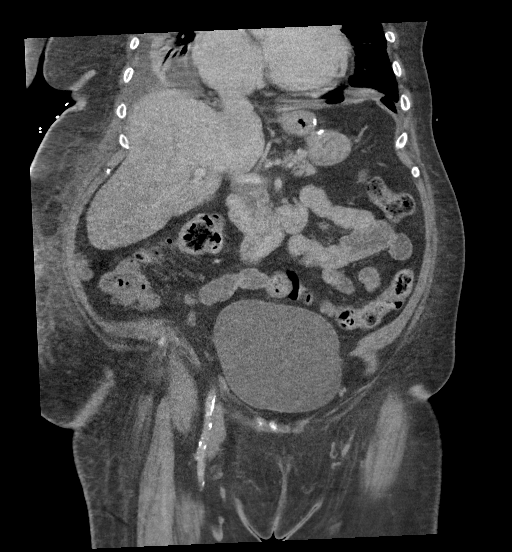

[17 of 46 positions shown; findings below may reference images not displayed]

FINDINGS: Lower chest: Bilateral pleural effusions with bilateral lower lobe
airspace disease. See further discussion on chest CT.

Hepatobiliary: No focal hepatic abnormality. Gallbladder
unremarkable.

Pancreas: No focal abnormality or ductal dilatation.

Spleen: No focal abnormality.  Normal size.

Adrenals/Urinary Tract: Fullness of the adrenal glands bilaterally,
likely hyperplasia. No renal mass or hydronephrosis. Urinary bladder
unremarkable.

Stomach/Bowel: Sigmoid diverticulosis. No active diverticulitis.
Scattered right colonic diverticulosis. Stomach and small bowel
decompressed, unremarkable. Prior gastric bypass.

Vascular/Lymphatic: Diffuse aortoiliac atherosclerosis. No evidence
of aneurysm or adenopathy.

Reproductive: No mass.

Other: No free fluid or free air.

Musculoskeletal: No acute bony abnormality.
IMPRESSION: Bilateral pleural effusions with lower lobe airspace disease. See
further discussion on chest CT report.

Scattered colonic diverticulosis. No active diverticulitis.

Aortoiliac atherosclerosis.

No acute findings in the abdomen or pelvis.

## 2022-11-07 IMAGING — DX DG CHEST 1V PORT
1 series · 1 of 1 positions shown · non-contrast
Comparison: 07/01/2018.

CLINICAL DATA: Short of breath.  Hypoxia.

EXAM:
PORTABLE CHEST 1 VIEW

[chest ap]
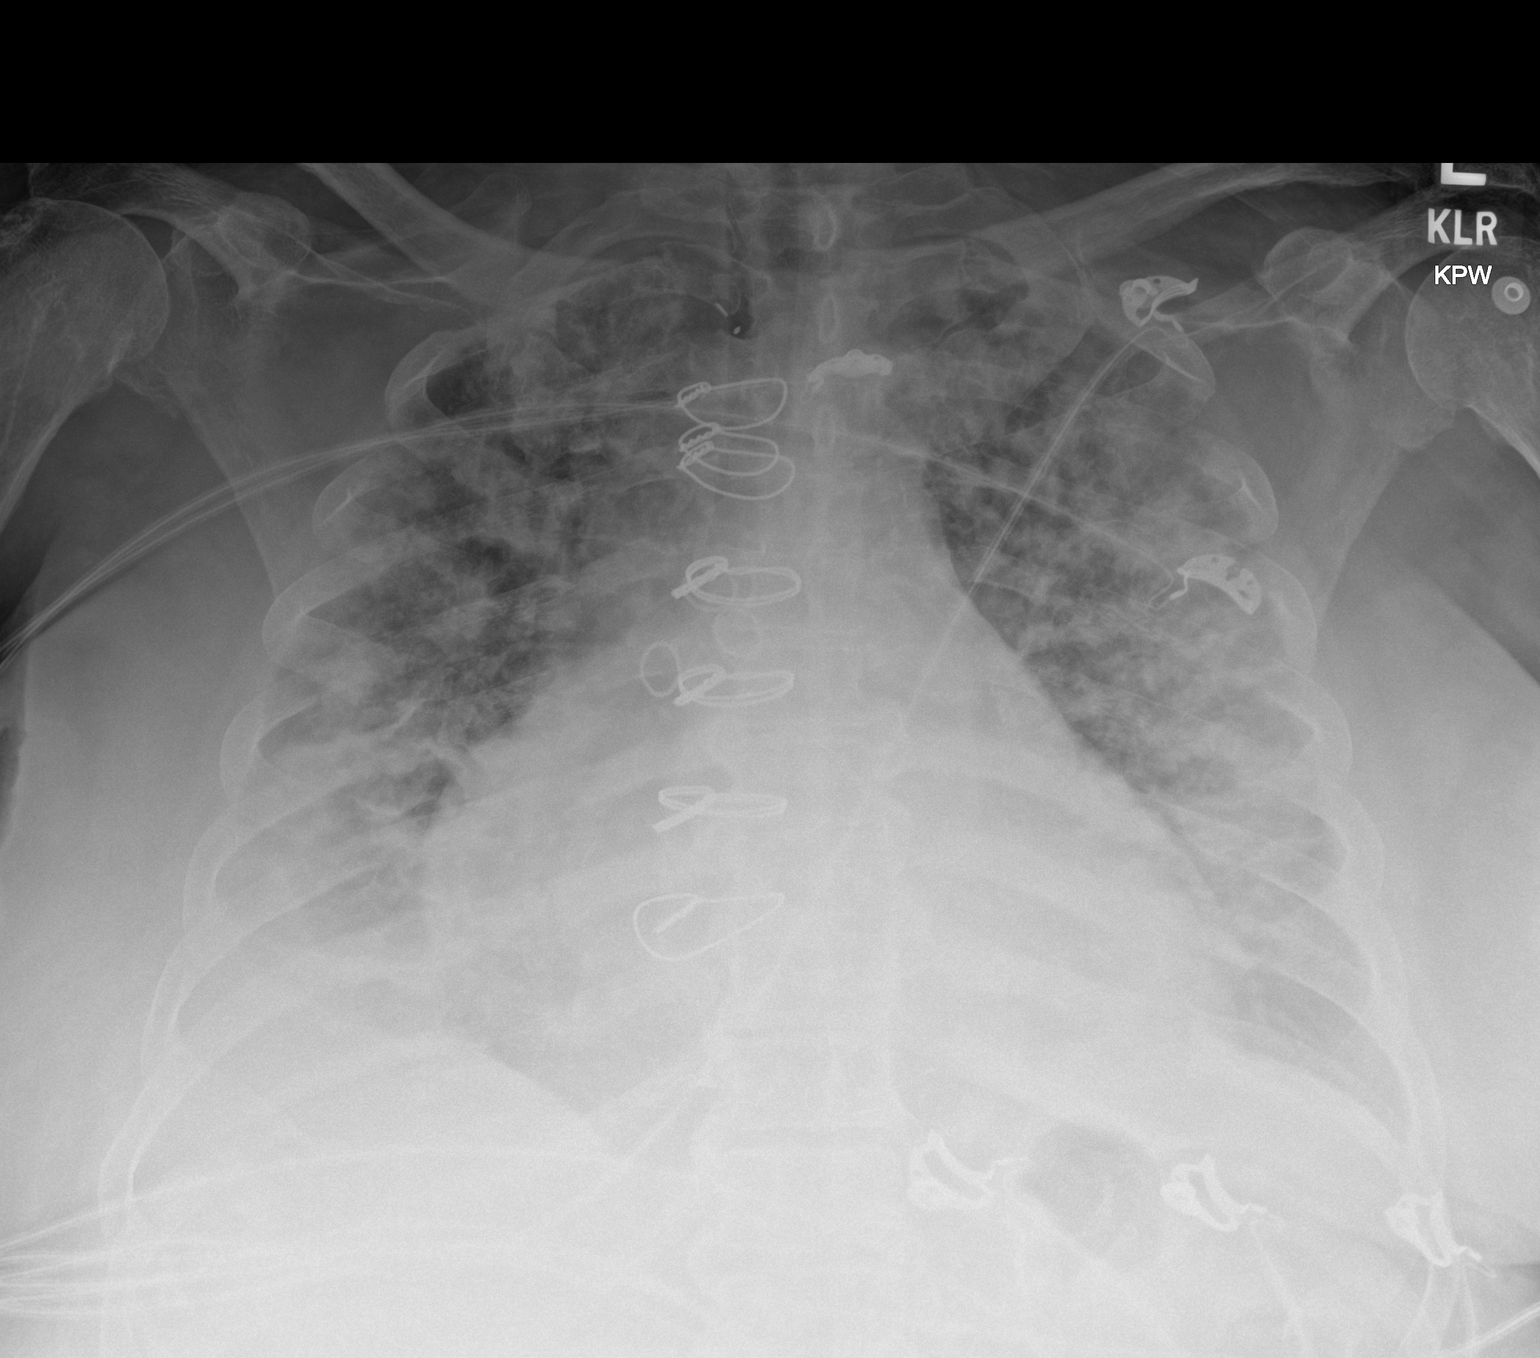

[1 of 1 positions shown; findings below may reference images not displayed]

FINDINGS: Patchy bilateral airspace lung opacities are noted, most evident
peripherally and at the bases.

Possible small pleural effusions.  No pneumothorax.

Changes from prior CABG surgery noted. Cardiac silhouette is mildly
enlarged. No convincing mediastinal or hilar masses.

Skeletal structures are grossly intact.
IMPRESSION: 1. Bilateral airspace lung opacities, which may reflect multifocal
pneumonia. Pulmonary edema is possible, supported by possible small
effusions and mild cardiomegaly.
